# Patient Record
Sex: Female | Born: 1960 | Race: White | Hispanic: No | Marital: Married | Smoking: Never smoker
Health system: Southern US, Community
[De-identification: ages and names within clinical notes are randomized; demographics above are authoritative.]

## PROBLEM LIST (undated history)

## (undated) DIAGNOSIS — R519 Headache, unspecified: Secondary | ICD-10-CM

## (undated) DIAGNOSIS — D62 Acute posthemorrhagic anemia: Secondary | ICD-10-CM

## (undated) DIAGNOSIS — K219 Gastro-esophageal reflux disease without esophagitis: Secondary | ICD-10-CM

## (undated) DIAGNOSIS — I219 Acute myocardial infarction, unspecified: Secondary | ICD-10-CM

## (undated) DIAGNOSIS — M7580 Other shoulder lesions, unspecified shoulder: Secondary | ICD-10-CM

## (undated) DIAGNOSIS — R9431 Abnormal electrocardiogram [ECG] [EKG]: Secondary | ICD-10-CM

## (undated) DIAGNOSIS — M1990 Unspecified osteoarthritis, unspecified site: Secondary | ICD-10-CM

## (undated) DIAGNOSIS — Z9889 Other specified postprocedural states: Secondary | ICD-10-CM

## (undated) DIAGNOSIS — R51 Headache: Secondary | ICD-10-CM

## (undated) DIAGNOSIS — F449 Dissociative and conversion disorder, unspecified: Secondary | ICD-10-CM

## (undated) DIAGNOSIS — G709 Myoneural disorder, unspecified: Secondary | ICD-10-CM

## (undated) DIAGNOSIS — H269 Unspecified cataract: Secondary | ICD-10-CM

## (undated) DIAGNOSIS — I10 Essential (primary) hypertension: Secondary | ICD-10-CM

## (undated) DIAGNOSIS — F1190 Opioid use, unspecified, uncomplicated: Secondary | ICD-10-CM

## (undated) DIAGNOSIS — F329 Major depressive disorder, single episode, unspecified: Secondary | ICD-10-CM

## (undated) DIAGNOSIS — K5903 Drug induced constipation: Secondary | ICD-10-CM

## (undated) DIAGNOSIS — I951 Orthostatic hypotension: Secondary | ICD-10-CM

## (undated) DIAGNOSIS — F32A Depression, unspecified: Secondary | ICD-10-CM

## (undated) DIAGNOSIS — T402X5A Adverse effect of other opioids, initial encounter: Secondary | ICD-10-CM

## (undated) DIAGNOSIS — I340 Nonrheumatic mitral (valve) insufficiency: Secondary | ICD-10-CM

## (undated) DIAGNOSIS — Z9289 Personal history of other medical treatment: Secondary | ICD-10-CM

## (undated) DIAGNOSIS — T7840XA Allergy, unspecified, initial encounter: Secondary | ICD-10-CM

## (undated) DIAGNOSIS — R52 Pain, unspecified: Secondary | ICD-10-CM

## (undated) DIAGNOSIS — M21371 Foot drop, right foot: Secondary | ICD-10-CM

## (undated) DIAGNOSIS — M9979 Connective tissue and disc stenosis of intervertebral foramina of abdomen and other regions: Secondary | ICD-10-CM

## (undated) DIAGNOSIS — I483 Typical atrial flutter: Secondary | ICD-10-CM

## (undated) DIAGNOSIS — R112 Nausea with vomiting, unspecified: Secondary | ICD-10-CM

## (undated) DIAGNOSIS — R42 Dizziness and giddiness: Secondary | ICD-10-CM

## (undated) DIAGNOSIS — D510 Vitamin B12 deficiency anemia due to intrinsic factor deficiency: Secondary | ICD-10-CM

## (undated) DIAGNOSIS — Z981 Arthrodesis status: Secondary | ICD-10-CM

## (undated) DIAGNOSIS — K266 Chronic or unspecified duodenal ulcer with both hemorrhage and perforation: Secondary | ICD-10-CM

## (undated) DIAGNOSIS — F4310 Post-traumatic stress disorder, unspecified: Secondary | ICD-10-CM

## (undated) DIAGNOSIS — D649 Anemia, unspecified: Secondary | ICD-10-CM

## (undated) DIAGNOSIS — F419 Anxiety disorder, unspecified: Secondary | ICD-10-CM

## (undated) DIAGNOSIS — G8918 Other acute postprocedural pain: Secondary | ICD-10-CM

## (undated) DIAGNOSIS — IMO0002 Reserved for concepts with insufficient information to code with codable children: Secondary | ICD-10-CM

## (undated) DIAGNOSIS — E871 Hypo-osmolality and hyponatremia: Secondary | ICD-10-CM

## (undated) DIAGNOSIS — J189 Pneumonia, unspecified organism: Secondary | ICD-10-CM

## (undated) DIAGNOSIS — J45909 Unspecified asthma, uncomplicated: Secondary | ICD-10-CM

## (undated) DIAGNOSIS — Z87442 Personal history of urinary calculi: Secondary | ICD-10-CM

## (undated) DIAGNOSIS — G8929 Other chronic pain: Secondary | ICD-10-CM

## (undated) DIAGNOSIS — M25559 Pain in unspecified hip: Secondary | ICD-10-CM

## (undated) DIAGNOSIS — M5090 Cervical disc disorder, unspecified, unspecified cervical region: Secondary | ICD-10-CM

## (undated) HISTORY — PX: TONSILLECTOMY: SUR1361

## (undated) HISTORY — DX: Arthrodesis status: Z98.1

## (undated) HISTORY — DX: Chronic or unspecified duodenal ulcer with both hemorrhage and perforation: K26.6

## (undated) HISTORY — DX: Myoneural disorder, unspecified: G70.9

## (undated) HISTORY — DX: Unspecified osteoarthritis, unspecified site: M19.90

## (undated) HISTORY — PX: JOINT REPLACEMENT: SHX530

## (undated) HISTORY — DX: Post-traumatic stress disorder, unspecified: F43.10

## (undated) HISTORY — DX: Nonrheumatic mitral (valve) insufficiency: I34.0

## (undated) HISTORY — DX: Headache, unspecified: R51.9

## (undated) HISTORY — DX: Acute posthemorrhagic anemia: D62

## (undated) HISTORY — DX: Pain in unspecified hip: M25.559

## (undated) HISTORY — DX: Unspecified cataract: H26.9

## (undated) HISTORY — DX: Dissociative and conversion disorder, unspecified: F44.9

## (undated) HISTORY — PX: ABDOMINAL HYSTERECTOMY: SHX81

## (undated) HISTORY — PX: EYE SURGERY: SHX253

## (undated) HISTORY — PX: CHOLECYSTECTOMY: SHX55

## (undated) HISTORY — PX: RADIOFREQUENCY ABLATION NERVES: SUR1070

## (undated) HISTORY — DX: Acute myocardial infarction, unspecified: I21.9

## (undated) HISTORY — DX: Other shoulder lesions, unspecified shoulder: M75.80

## (undated) HISTORY — DX: Dizziness and giddiness: R42

## (undated) HISTORY — DX: Headache: R51

## (undated) HISTORY — DX: Other acute postprocedural pain: G89.18

## (undated) HISTORY — DX: Other specified postprocedural states: Z98.890

## (undated) HISTORY — DX: Allergy, unspecified, initial encounter: T78.40XA

## (undated) HISTORY — DX: Typical atrial flutter: I48.3

## (undated) HISTORY — DX: Orthostatic hypotension: I95.1

## (undated) HISTORY — DX: Hypo-osmolality and hyponatremia: E87.1

## (undated) HISTORY — PX: CARDIAC ELECTROPHYSIOLOGY MAPPING AND ABLATION: SHX1292

## (undated) HISTORY — DX: Cervical disc disorder, unspecified, unspecified cervical region: M50.90

## (undated) HISTORY — PX: SPINE SURGERY: SHX786

## (undated) HISTORY — PX: OTHER SURGICAL HISTORY: SHX169

## (undated) HISTORY — DX: Personal history of other medical treatment: Z92.89

## (undated) HISTORY — DX: Other chronic pain: G89.29

## (undated) HISTORY — PX: HERNIA REPAIR: SHX51

## (undated) HISTORY — DX: Vitamin B12 deficiency anemia due to intrinsic factor deficiency: D51.0

## (undated) HISTORY — DX: Connective tissue and disc stenosis of intervertebral foramina of abdomen and other regions: M99.79

---

## 1996-03-13 HISTORY — PX: BREAST EXCISIONAL BIOPSY: SUR124

## 1998-11-18 ENCOUNTER — Ambulatory Visit (HOSPITAL_COMMUNITY): Admission: RE | Admit: 1998-11-18 | Discharge: 1998-11-18 | Payer: Self-pay | Admitting: Internal Medicine

## 1998-11-18 ENCOUNTER — Encounter: Payer: Self-pay | Admitting: Internal Medicine

## 1998-12-02 ENCOUNTER — Ambulatory Visit (HOSPITAL_COMMUNITY): Admission: RE | Admit: 1998-12-02 | Discharge: 1998-12-02 | Payer: Self-pay | Admitting: Internal Medicine

## 1999-11-17 ENCOUNTER — Other Ambulatory Visit: Admission: RE | Admit: 1999-11-17 | Discharge: 1999-11-17 | Payer: Self-pay | Admitting: Family Medicine

## 2003-04-27 DIAGNOSIS — K266 Chronic or unspecified duodenal ulcer with both hemorrhage and perforation: Secondary | ICD-10-CM | POA: Insufficient documentation

## 2003-04-27 DIAGNOSIS — K589 Irritable bowel syndrome without diarrhea: Secondary | ICD-10-CM | POA: Insufficient documentation

## 2003-04-27 HISTORY — DX: Chronic or unspecified duodenal ulcer with both hemorrhage and perforation: K26.6

## 2004-01-13 ENCOUNTER — Ambulatory Visit: Payer: Self-pay | Admitting: General Surgery

## 2004-03-10 ENCOUNTER — Ambulatory Visit: Payer: Self-pay | Admitting: Gastroenterology

## 2004-03-22 ENCOUNTER — Ambulatory Visit: Payer: Self-pay | Admitting: Gastroenterology

## 2004-03-24 ENCOUNTER — Ambulatory Visit: Payer: Self-pay | Admitting: Gastroenterology

## 2004-04-19 ENCOUNTER — Ambulatory Visit: Payer: Self-pay | Admitting: Gastroenterology

## 2004-05-09 ENCOUNTER — Emergency Department: Payer: Self-pay | Admitting: Emergency Medicine

## 2004-05-09 ENCOUNTER — Other Ambulatory Visit: Payer: Self-pay

## 2004-06-20 ENCOUNTER — Ambulatory Visit: Payer: Self-pay | Admitting: Family Medicine

## 2004-06-21 ENCOUNTER — Ambulatory Visit: Payer: Self-pay | Admitting: Internal Medicine

## 2004-07-11 ENCOUNTER — Ambulatory Visit: Payer: Self-pay | Admitting: Internal Medicine

## 2004-08-11 ENCOUNTER — Ambulatory Visit: Payer: Self-pay | Admitting: Internal Medicine

## 2004-08-15 DIAGNOSIS — D510 Vitamin B12 deficiency anemia due to intrinsic factor deficiency: Secondary | ICD-10-CM

## 2004-08-15 HISTORY — DX: Vitamin B12 deficiency anemia due to intrinsic factor deficiency: D51.0

## 2004-09-10 ENCOUNTER — Ambulatory Visit: Payer: Self-pay | Admitting: Internal Medicine

## 2006-03-13 HISTORY — PX: BREAST BIOPSY: SHX20

## 2006-03-28 ENCOUNTER — Ambulatory Visit: Payer: Self-pay | Admitting: Family Medicine

## 2006-04-04 ENCOUNTER — Ambulatory Visit: Payer: Self-pay | Admitting: Family Medicine

## 2006-04-11 ENCOUNTER — Ambulatory Visit: Payer: Self-pay | Admitting: Family Medicine

## 2006-08-14 ENCOUNTER — Inpatient Hospital Stay: Payer: Self-pay | Admitting: Internal Medicine

## 2006-08-14 ENCOUNTER — Other Ambulatory Visit: Payer: Self-pay

## 2006-09-12 ENCOUNTER — Ambulatory Visit: Payer: Self-pay | Admitting: General Surgery

## 2007-02-22 ENCOUNTER — Ambulatory Visit: Payer: Self-pay | Admitting: Urology

## 2007-03-04 ENCOUNTER — Ambulatory Visit: Payer: Self-pay | Admitting: Urology

## 2007-03-14 HISTORY — PX: BACK SURGERY: SHX140

## 2007-03-18 ENCOUNTER — Ambulatory Visit: Payer: Self-pay | Admitting: Family Medicine

## 2007-05-03 ENCOUNTER — Ambulatory Visit: Payer: Self-pay | Admitting: Family Medicine

## 2007-05-07 ENCOUNTER — Ambulatory Visit: Payer: Self-pay | Admitting: General Surgery

## 2007-05-15 ENCOUNTER — Ambulatory Visit: Payer: Self-pay | Admitting: Family Medicine

## 2007-11-04 ENCOUNTER — Encounter: Admission: RE | Admit: 2007-11-04 | Discharge: 2007-11-04 | Payer: Self-pay | Admitting: Orthopedic Surgery

## 2007-11-28 ENCOUNTER — Ambulatory Visit: Payer: Self-pay | Admitting: *Deleted

## 2008-01-08 ENCOUNTER — Ambulatory Visit: Payer: Self-pay | Admitting: *Deleted

## 2008-01-08 ENCOUNTER — Inpatient Hospital Stay (HOSPITAL_COMMUNITY): Admission: RE | Admit: 2008-01-08 | Discharge: 2008-01-22 | Payer: Self-pay | Admitting: Orthopedic Surgery

## 2008-01-18 ENCOUNTER — Ambulatory Visit: Payer: Self-pay | Admitting: Infectious Disease

## 2008-02-17 ENCOUNTER — Encounter: Admission: RE | Admit: 2008-02-17 | Discharge: 2008-02-17 | Payer: Self-pay | Admitting: Orthopedic Surgery

## 2008-02-19 ENCOUNTER — Encounter: Admission: RE | Admit: 2008-02-19 | Discharge: 2008-02-19 | Payer: Self-pay | Admitting: Orthopedic Surgery

## 2008-03-09 ENCOUNTER — Encounter: Admission: RE | Admit: 2008-03-09 | Discharge: 2008-03-09 | Payer: Self-pay | Admitting: Orthopedic Surgery

## 2008-05-08 ENCOUNTER — Ambulatory Visit (HOSPITAL_COMMUNITY): Admission: RE | Admit: 2008-05-08 | Discharge: 2008-05-08 | Payer: Self-pay | Admitting: Orthopedic Surgery

## 2008-10-20 ENCOUNTER — Ambulatory Visit: Payer: Self-pay | Admitting: Family Medicine

## 2009-06-08 ENCOUNTER — Encounter: Admission: RE | Admit: 2009-06-08 | Discharge: 2009-06-08 | Payer: Self-pay | Admitting: Occupational Medicine

## 2010-02-23 ENCOUNTER — Emergency Department: Payer: Self-pay | Admitting: Emergency Medicine

## 2010-04-14 ENCOUNTER — Other Ambulatory Visit: Payer: Self-pay | Admitting: Orthopedic Surgery

## 2010-04-14 DIAGNOSIS — M542 Cervicalgia: Secondary | ICD-10-CM

## 2010-04-22 ENCOUNTER — Ambulatory Visit
Admission: RE | Admit: 2010-04-22 | Discharge: 2010-04-22 | Disposition: A | Discharge status: Home / Self Care | Payer: No Typology Code available for payment source | Source: Ambulatory Visit | Attending: Orthopedic Surgery | Admitting: Orthopedic Surgery

## 2010-04-22 DIAGNOSIS — M542 Cervicalgia: Secondary | ICD-10-CM

## 2010-05-13 ENCOUNTER — Ambulatory Visit: Payer: Self-pay | Admitting: Family Medicine

## 2010-06-28 LAB — COMPREHENSIVE METABOLIC PANEL
ALT: 15 U/L (ref 0–35)
AST: 21 U/L (ref 0–37)
CO2: 23 mEq/L (ref 19–32)
Calcium: 9.6 mg/dL (ref 8.4–10.5)
Chloride: 108 mEq/L (ref 96–112)
GFR calc Af Amer: >60 mL/min (ref >60)
GFR calc non Af Amer: >60 mL/min (ref >60)
Glucose, Bld: 105 mg/dL — ABNORMAL HIGH (ref 70–99)
Sodium: 138 mEq/L (ref 135–145)
Total Bilirubin: 0.6 mg/dL (ref 0.3–1.2)

## 2010-06-28 LAB — CBC
Hemoglobin: 12.8 g/dL (ref 12.0–15.0)
MCHC: 33.6 g/dL (ref 30.0–36.0)
MCV: 88.3 fL (ref 78.0–100.0)
RBC: 4.31 MIL/uL (ref 3.87–5.11)
WBC: 4.7 10*3/uL (ref 4.0–10.5)

## 2010-06-28 LAB — URINALYSIS, ROUTINE W REFLEX MICROSCOPIC
Nitrite: NEGATIVE
Protein, ur: NEGATIVE mg/dL
Specific Gravity, Urine: 1.026 (ref 1.005–1.030)
Urobilinogen, UA: 0.2 mg/dL (ref 0.0–1.0)

## 2010-06-28 LAB — URINE MICROSCOPIC-ADD ON

## 2010-06-28 LAB — PROTIME-INR: INR: 1 (ref 0.00–1.49)

## 2010-07-06 ENCOUNTER — Other Ambulatory Visit: Payer: Self-pay | Admitting: Orthopedic Surgery

## 2010-07-06 DIAGNOSIS — M25551 Pain in right hip: Secondary | ICD-10-CM

## 2010-07-12 HISTORY — PX: ANTERIOR FUSION CERVICAL SPINE: SUR626

## 2010-07-18 ENCOUNTER — Other Ambulatory Visit (HOSPITAL_COMMUNITY): Payer: Self-pay | Admitting: Orthopedic Surgery

## 2010-07-18 ENCOUNTER — Ambulatory Visit
Admission: RE | Admit: 2010-07-18 | Discharge: 2010-07-18 | Disposition: A | Discharge status: Home / Self Care | Payer: No Typology Code available for payment source | Source: Ambulatory Visit | Attending: Orthopedic Surgery | Admitting: Orthopedic Surgery

## 2010-07-18 ENCOUNTER — Encounter (HOSPITAL_COMMUNITY)
Admission: RE | Admit: 2010-07-18 | Discharge: 2010-07-18 | Disposition: A | Discharge status: Home / Self Care | Payer: No Typology Code available for payment source | Source: Ambulatory Visit | Attending: Orthopedic Surgery | Admitting: Orthopedic Surgery

## 2010-07-18 ENCOUNTER — Ambulatory Visit (HOSPITAL_COMMUNITY)
Admission: RE | Admit: 2010-07-18 | Discharge: 2010-07-18 | Disposition: A | Discharge status: Home / Self Care | Payer: No Typology Code available for payment source | Source: Ambulatory Visit | Attending: Orthopedic Surgery | Admitting: Orthopedic Surgery

## 2010-07-18 DIAGNOSIS — M542 Cervicalgia: Secondary | ICD-10-CM | POA: Insufficient documentation

## 2010-07-18 DIAGNOSIS — Z01818 Encounter for other preprocedural examination: Secondary | ICD-10-CM | POA: Insufficient documentation

## 2010-07-18 DIAGNOSIS — M25551 Pain in right hip: Secondary | ICD-10-CM

## 2010-07-18 DIAGNOSIS — Z01812 Encounter for preprocedural laboratory examination: Secondary | ICD-10-CM | POA: Insufficient documentation

## 2010-07-18 DIAGNOSIS — Z01810 Encounter for preprocedural cardiovascular examination: Secondary | ICD-10-CM | POA: Insufficient documentation

## 2010-07-18 LAB — BASIC METABOLIC PANEL
Calcium: 9.9 mg/dL (ref 8.4–10.5)
GFR calc Af Amer: >60 mL/min (ref >60)
GFR calc non Af Amer: >60 mL/min (ref >60)
Glucose, Bld: 106 mg/dL — ABNORMAL HIGH (ref 70–99)
Potassium: 4 mEq/L (ref 3.5–5.1)
Sodium: 140 mEq/L (ref 135–145)

## 2010-07-18 LAB — CBC
HCT: 37.8 % (ref 36.0–46.0)
MCHC: 33.9 g/dL (ref 30.0–36.0)
Platelets: 322 10*3/uL (ref 150–400)
RDW: 12.9 % (ref 11.5–15.5)
WBC: 6.2 10*3/uL (ref 4.0–10.5)

## 2010-07-18 LAB — SURGICAL PCR SCREEN
MRSA, PCR: NEGATIVE
Staphylococcus aureus: NEGATIVE

## 2010-07-20 ENCOUNTER — Ambulatory Visit (HOSPITAL_COMMUNITY)
Admission: RE | Admit: 2010-07-20 | Payer: No Typology Code available for payment source | Source: Ambulatory Visit | Admitting: Orthopedic Surgery

## 2010-07-26 NOTE — Op Note (Signed)
[name], [name]             ACCOUNT NO.:  [id number]   MEDICAL RECORD NO.:  [id number]          PATIENT TYPE:  AMB   LOCATION:  DAY                          FACILITY:  [facility]   PHYSICIAN:  Ollen Gross, M.D.    DATE OF BIRTH:  1960-04-15   DATE OF PROCEDURE:  05/08/2008  DATE OF DISCHARGE:                               OPERATIVE REPORT   PREOPERATIVE DIAGNOSIS:  Right hip labral tear,   POSTOPERATIVE DIAGNOSES:  Right hip labral tear plus chondral defect,  acetabulum.   PROCEDURE:  Right hip arthroscopy with labral debridement and  chondroplasty.   SURGEON:  Ollen Gross, M.D.   ASSISTANT:  Alexzandrew L. Perkins, P.A.C.   ANESTHESIA:  General.   ESTIMATED BLOOD LOSS:  Minimal.   DRAINS:  None.   COMPLICATIONS:  None.   CONDITION:  Stable to recovery.   CLINICAL NOTE:  [name] is a 50 year old female who has severe right hip  pain and mechanical type symptoms.  She had previous back surgery  recently by Dr. Shon Baton and had developed severe hip pain after that.  Workup included an MRI showing a labral tear.  She had intra-articular  injection which did help the pain temporarily.  She presents now for hip  arthroscopy with possible debridement.   PROCEDURE IN DETAIL:  After successful administration of general  anesthetic, the patient was placed in left lateral decubitus position  with the right side up.  The right leg was draped over the well-padded  perineal post and right foot put into a well-padded leg holder.  The  bottom leg was also well padded on the bed.  Under fluoroscopic guidance  traction was applied across the right lower extremity to adequately  distract the hip.  Once in a distracted position, traction was locked in  that position.  The thigh was then prepped and draped in the usual  sterile fashion.  The spinal needles were used to create the anterior  and posterior peritrochanteric portals.  They were passed into the  joint.  The posterior  portal was created first.  An incision made and  via the cannulated system the camera and cannula were passed into the  joint.  Once confirmed to be intraarticular the inflow was initiated.  The femoral head looks normal throughout its extent.  The posterior and  superior acetabulum looked fine.  Around the 1 o'clock or 2 o'clock  position there was no distinct labral tear, but appears to be some  instability of the acetabular cartilage.  I created the anterior portal  and placed a probe.  Indeed, there was some blistering of the acetabular  cartilage where it was separating from the underlying bone.  The labrum  showed no evidence of detachment and there was just some fraying at the  free edge, but no full tear.  I debrided the cartilage defect back to a  stable bony base with stable cartilaginous edges.  I probed all around  it and everything else was stable.  The whole area of this was only  about 1 x 1 cm.  The frayed area of the labrum  was debrided by the  shaver and then the ArthroCare device to stabilize it.  I then again  inspected the rest of the joint.  I again probed that area anteriorly  and there is no other unstable cartilage noted.  The rest of the joint  looked fine.  I then removed the arthroscopic equipment from the  anterior portal and then injected 20 mL of 0.25% Marcaine with epi  through the inflow cannula and then removed that cannula.  Traction was  taken off the joint and then the incision was closed with interrupted 4-  0 nylon.  The hip was placed through range of motion to confirm that it  was reduced.  A sterile bulky sterile dressing was then applied and she  was awakened and transported to recovery in stable condition.      Ollen Gross, M.D.  Electronically Signed     FA/MEDQ  D:  05/08/2008  T:  05/08/2008  Job:  [id number]

## 2010-07-26 NOTE — Op Note (Signed)
NAMETERASA, ORSINI             ACCOUNT NO.:  [id number]   MEDICAL RECORD NO.:  [id number]          PATIENT TYPE:  INP   LOCATION:  2550                         FACILITY:  [facility]   PHYSICIAN:  Alvy Beal, MD    DATE OF BIRTH:  03/09/1961   DATE OF PROCEDURE:  DATE OF DISCHARGE:                               OPERATIVE REPORT   PREOPERATIVE DIAGNOSIS:  Degenerative lumbar disk disease with  degenerative scoliosis.   POSTOPERATIVE DIAGNOSIS:  Degenerative lumbar disk disease with  degenerative scoliosis.   PROCEDURE:  Stage I lumbar spinal fusion, anterior lumbar interbody  fusion L2-3, L3-4.   COMPLICATIONS:  None.   APPROACH SURGEON:  Dr. Madilyn Fireman.  Please refer to his notes for specifics  on the approach.   It should be noted that I did assisted him for the anterior approach.   HISTORY:  Tearah is a very pleasant 50 year old woman who has had  longstanding progressive low back pain.  Despite appropriate  conservative management consisting of physical therapy, injections,  medications, she progressed with her pain.  Diskogram and x-rays  confirmed that L2-3, L3-4 disk levels were most symptomatic.  Because of  the underlying deformity, the decision was made not only to treat the  diskogenic pain but also to address the deformity.  This will require  staged anterior interbody fusion for improved deformity correction with  the supplemental posterior fixation.  Today was stage I of that which  was an anterior lumbar interbody fusion L2-3, L3-4 with the planned  posterior T10 to L4 instrumented fusion Monday of next week.   OPERATIVE NOTE:  The patient was brought to the operating room and  placed supine on the operating table.  After successful induction of  general anesthesia and endotracheal intubation, TEDs, SCDs, and Foley  were applied.  The anterior abdomen was then prepped and draped in a  standard anterior.  Dr. Madilyn Fireman then performed a standard anterior  approach to  a transverse incision to expose the L2-3, L3-4 disk space.  Once we had approached, we placed the retractors and we took an x-ray  confirming the appropriate level.  At this point in time, I then went to  the left hand side in order to perform the diskectomy.   Once the annulotomy was performed with the #10 blade scalpel, then I  began resecting the disk material with the combination of pituitary  rongeurs, curettes, and Kerrison rongeurs.  Once I had the bulk of the  disk removed, I then identified the posterior aspect of the disk and  annulus.  I had resected all the way down to the posterior annulus.  I  then used the posterior longitudinal elevator to restrict the posterior  annulus off the posterior aspect of the L2 and L3 vertebral bodies.   At this point in time with the diskectomy complete, I then used  sequential rasp to prepare the endplates.  Once the subchondral bone was  removed, I then measured the interbody space.  For the first space I  used a Stryker PEEK interbody cage packed with Actifuse.  This is 22 mm  x 28 mm with 12 mm high 8 degree lordotic __________.  I had excellent  fixation.   Once I completed this diskectomy, I confirmed satisfactory position, AP  and lateral planes.  Once confirmed, I then proceeded to the L3-4 level.  Using the same technique I changed the retractor blade's placement and  ensured there was no adverse bleeding.  I then used the same technique  to resect the entire L3-4 disk material.  Once I had the complete  diskectomy done in a similar fashion at L3-4, I rasped sequentially from  10 to 12 and used the same size interbody device to fuse the L3-4 level.   At this point, I irrigated the wound copiously with normal saline and  checked to see if there was any hematoma or hemorrhage.  Once I had  evacuated and maintained hemostasis, I then removed all the retractors.  Final intraoperative AP and lateral was taken to confirm that was no   retained surgical instrumentation or sponges.  I also noted on that we  had good correction of the curvature.  Dr. Madilyn Fireman then came back to the  room to check the wound and felt as though that it was satisfactory to  close just the anterior rectus sheath.  I then closed the anterior  rectus sheath with interrupted #1 Vicryl sutures in layered closure with  running 0 and interrupted 2-0 Vicryl sutures for the subcutaneous fat.  I then reapproximated the skin edges with 3-0 Monocryl.  Steri-Strips  and dry dressing were applied.  The patient was extubated and  transferred to the PACU without incident.  At the end of the case, all  needle and sponge counts were correct.      Alvy Beal, MD  Electronically Signed     DDB/MEDQ  D:  01/08/2008  T:  01/09/2008  Job:  [id number]

## 2010-07-26 NOTE — Consult Note (Signed)
[name], [name]             ACCOUNT NO.:  [id number]   MEDICAL RECORD NO.:  [id number]          PATIENT TYPE:  INP   LOCATION:  5034                         FACILITY:  [facility]   PHYSICIAN:  Renee Ramus, MD       DATE OF BIRTH:  Jan 03, 1961   DATE OF CONSULTATION:  DATE OF DISCHARGE:                                 CONSULTATION   HISTORY OF PRESENT ILLNESS:  The patient is a 50 year old female who is  admitted to the hospital for instrumented fusion of T4 to L10.  I called  to see the patient after discovery of pyelonephritis which was resistant  to multiple antibiotics.   PAST MEDICAL HISTORY:  1. Hypertension.  2. Degenerative disk disease.  3. Neuropathy.  4. Anxiety.  5. 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   SOCIAL HISTORY:  The patient is married, with 2 children.  She works as  a Engineer, civil (consulting).  She has no alcohol or tobacco history.   FAMILY HISTORY:  Not available.   REVIEW OF SYSTEMS:  All other comprehensive review of systems are  negative.   CURRENT MEDICATIONS:  1. Cefazolin.  2. Ciprofloxacin.  3. Lopressor.   ALLERGIES:  CODEINE and SHELLFISH and an intolerance to TMP SULFA.   PHYSICAL EXAMINATION:  GENERAL:  This is a well-developed, well-  nourished white female currently in no apparent distress.  VITAL SIGNS:  Pulse 71, temperature 98, respiratory rate 18, blood  pressure 105/63.  HEENT:  No jugular venous distention or lymphadenopathy.  Oropharynx is  clear.  Mucous membranes are pink and moist.  TMs are clear bilaterally.  Pupils are equal and reactive to light and accommodation.  Extraocular  muscles are intact.  CARDIOVASCULAR:  Regular, rate, and rhythm without murmur, rubs, or  gallops.  PULMONARY:  Lungs are clear to auscultation bilaterally.  ABDOMEN:  Soft, nontender, and nondistended without hepatosplenomegaly.  Bowel sounds are present.  She  has no rebound or guarding.  EXTREMITIES:  She has no clubbing, cyanosis, or edema.  She has good  peripheral pulses in dorsalis pedis and radial arteries.  She is able to  move all extremities.  NEUROLOGIC:  Cranial nerves II through XII are grossly intact.  She has  no focal neurological deficits.   LABORATORY DATA:  White count 6.9, H&H 11.7 and 33, MCV 89, platelets  348.  Sodium 136, potassium 3.7, chloride 102, bicarb 26, BUN 8,  creatinine 0.7, glucose 108.  UA currently shows trace blood, trace  leukocyte, few yeast and bacteria.  Previous UA on January 11, 2008,  showed higher degree of leukocytes and bacteria.  Culture on January 11, 2008, showed E. coli which was resistant to all antibiotics except for  Macrobid and imipenem.  Culture for UA done on January 14, 2008, shows  E. coli with sensitivities currently pending.   ASSESSMENT AND PLAN:  1. Status post spinal fusion, treatment for surgery.  2. Hypertension, currently well controlled.  3. Urinary tract infection.  The  patient should be sensitive to      Macrobid according to sensitivities, however, given her history of      atrophic kidney and the fact that she has recently had major      surgery and the fact that she has been on Macrobid in the past as      prophylaxis, we will treat her initially with imipenem x6 doses and      transition to Macrobid x7 days.  Followup UA in the a.m. to see if      the patient is responding.   DISPOSITION:  We will continue to follow.  Consult note was obtained by  reviewing past medical history confirming with the attending and  reviewing the current medical history.   TIME SPENT:  One  hour.      Renee Ramus, MD  Electronically Signed     JF/MEDQ  D:  01/15/2008  T:  01/16/2008  Job:  [id number]

## 2010-07-26 NOTE — Op Note (Signed)
[name], [name]             ACCOUNT NO.:  [id number]   MEDICAL RECORD NO.:  [id number]          PATIENT TYPE:  INP   LOCATION:  0454                         FACILITY:  [facility]   PHYSICIAN:  Balinda Quails, M.D.    DATE OF BIRTH:  [birth year]   DATE OF PROCEDURE:  01/09/2008  DATE OF DISCHARGE:                               OPERATIVE REPORT   SURGEON:  Balinda Quails, MD   CO-SURGEONDebria Garret D. Shon Baton, MD   ANESTHETIC:  General endotracheal.   PREOPERATIVE DIAGNOSIS:  L2-3 degenerative disk disease.   POSTOPERATIVE DIAGNOSIS:  L2-3 degenerative disk disease.   PROCEDURE:  L2-3 anterior lumbar interbody fusion (ALIF).   CLINICAL NOTE:  [name] was a 50 year old female with history of  chronic back pain.  She was seen in consultation preoperatively in the  office and felt to be an adequate candidate for an L2-3 anterior lumbar  interbody fusion.  Details of the operative procedure were reviewed with  the patient, including potential complications.  Complications include,  but are not limited to ureter injury, DVT, pulmonary embolus, bleeding,  transfusion, limb ischemia, deep infection, hernia, or other major  complication.   OPERATIVE PROCEDURE:  The patient was brought to the operating room in a  stable condition.  Placed in a supine position.  General endotracheal  anesthesia induced.  Central venous catheter, Foley catheter, and  arterial line in place.  In the supine position, general endotracheal  anesthesia induced.  The abdomen prepped and draped in a sterile  fashion.   Transverse skin incision then made at the left upper quadrant at  projection of L2-3.  Subcutaneous tissue divided.  The left anterior  rectus sheath cleared.  The left anterior rectus sheath incised  transversely from midline to lateral margin of rectus muscle.  Rectus  muscle mobilized medially and the posterior rectus sheath incised.  Peritoneum pushed off of the posterior rectus sheath.   Peritoneum was  very thin anteriorly and was entered anteriorly.  This was very thin and  filmy and therefore was not repaired.  This was retracted medially.  The  retroperitoneal space then created by pushing the peritoneal medial from  the lateral approach.  The left psoas muscle and genitofemoral nerve  identified, the nerve preserved on the muscle.  The left ureter  identified, retracted medially with the peritoneal contents.  The L2-3  bodies were identified.  Segmental arteries and veins were ligated with  clips and divided.  The iliolumbar vein was ligated with 2-0 silk and  divided.  This allowed complete mobilization medially of the origin of  the left common iliac vein.  Left common iliac vein rotated medially and  the L2-3 disk was identified with fluoroscopy.  The L2-3 disk cleared  from left-to-right with blunt dissection.   [name] retractors were then assembled.  The reverse lips were  used on the lateral margins of L2-3 bilaterally.  Malleable retractors  placed inferiorly and superiorly.  This allowed full exposure of the L2-  3 disk.  Dr. Shon Baton then completed L2-3 ALIF.  This was dictated under  separate heading.   There were no apparent complications during the exposure procedure.   Dr. Shon Baton then completed closure.  Sponge and instrument counts  correct.  No apparent complications.   In the PACU, the patient noted to have 2+ dorsalis pedis pulses  bilaterally.      Balinda Quails, M.D.  Electronically Signed     PGH/MEDQ  D:  01/08/2008  T:  01/09/2008  Job:  [id number]   cc:   Alvy Beal, MD

## 2010-07-26 NOTE — Op Note (Signed)
[name], [name]             ACCOUNT NO.:  [id number]   MEDICAL RECORD NO.:  [id number]          PATIENT TYPE:  INP   LOCATION:  5034                         FACILITY:  [facility]   PHYSICIAN:  Alvy Beal, MD    DATE OF BIRTH:  10/05/1960   DATE OF PROCEDURE:  DATE OF DISCHARGE:                               OPERATIVE REPORT   PREOPERATIVE DIAGNOSIS:  Degenerative disk disease, L2-3 and L3-4 with  scoliosis.   POSTOPERATIVE DIAGNOSIS:  Degenerative disk disease, L2-3 and L3-4 with  scoliosis.   OPERATIVE PROCEDURE:  Supplemental posterior instrumented fusion, T10-  L4.   HISTORY:  [name] is a very pleasant 50 year old woman who last week,  Wednesday, underwent a stage I treatment for her degenerative scoliosis  with degenerative disk disease.  She had an L2-3 and L3-4 anterior  lumbar interbody fusion without significant complication.  She recovered  without significant problems and she is brought back to the OR today for  planned posterior supplemental instrumented fusion and curve correction  from T10-L4.   All appropriate risks, benefits, and alternatives were discussed with  the patient.  Consent was obtained.   OPERATIVE NOTE:  The patient was brought to the operating room and  placed supine on the operating table.  After successful induction of  general anesthesia and endotracheal intubation, TED, SCD, and a Foley  were applied.  The half [place] frame was secured over the patient, and  she was turned into the prone position.   All bony prominences were well padded.  The back was then prepped and  draped in a standard fashion.  Fluoroscopy was then used to mark the L4  and T10 spinous process and appropriate midline posterior incision was  made.  Sharp dissection was carried out down to the deep fascia and the  deep fascia was incised.   Using a Cobb elevator and Bovie, I stripped the paraspinal muscles to  expose the T9 to the L4 spinous processes bilaterally.   With the  paraspinal muscles stripped, I then carried out my dissection over the  facet complexes to expose the L1-2, L2-3, and L3-4 facet complexes.  The  facet capsules were removed to facilitate fusion.  I then dissected out  laterally to expose the L1, L2, L3 and L4 transverse processes.  Once I  had the lumbar spine exposed bilaterally, I then extended my incision up  to the thoracic region.  I exposed the thoracic T9 through T12 spinous  processes.  I then expose the lamina and the costovertebral  articulation.  At this point, I had proper posterior access to the  entire thoracolumbar spine.   I began with placing my lumbar pedicle screws.   Under fluoroscopic guidance, I identified the appropriate position of  the pedicle and appropriate trajectory.  I identified the midportion of  the transverse process and traced it to the area junction of the lateral  aspect of the facet complex in transverse process.  I used a awl to  breakthrough the cortex and a pedicle probe was stent through the  pedicle and into the L4 vertebral body.  I confirmed trajectory in  position at both the AP and lateral planes.  I then removed the probe,  put a ball-tip feeler down to ensure there had a solid bony canal.  I  then tapped and then reprobed the hole.  Once I had a solid bony  articulation, I placed the appropriate-sized pedicle screw.   I then repeated this procedure on the contralateral side and again at  L3, L2, and an L1.  Once I had all the lumbar pedicle screws properly  positioned and confirmed down the AP and lateral planes, I then turned  my attention to the thoracic.  At T10 using the AP plane, I identified  the midline and identified the medial and lateral and superior and  inferior borders of the pedicle.  I then advanced the pedicle probe in  an extrapedicular fashion towards the medial wall of the pedicle.  Once  I was just near the medial wall of the pedicle, I put the  fluoroscopy  into the lateral to confirm that I was beyond the posterior vertebral  body.  Once I confirmed this, I then advanced it into the vertebral  body.  This is standard extrapedicular approach to the thoracic pedicle  screws.  I then removed the probe, used my ball-tip feeler to ensure  there had a solid bony canal and then I had not violated the canal.  I  then tapped, rechecked, and then placed the appropriate-sized pedicle  screw.  I repeated this on the contralateral side and then again at T11  and T12.  While all the pedicle screws were properly positioned, I then  took final AP and lateral fluoroscopy views.  For instrumentation, I  used the Stryker Radius pedicle screw system.  At L4 in the lumbar  region, I used 6.25 diameter 40 and 35-mm screws except for the left L1,  which was a 5.75 diameter and a thoracic spine, 35 mm length, 5.75 mm  diameter screws.   I then obtained two 220-mm rods, contoured them appropriately and  secured them.  I confirmed satisfactory position in the AP and lateral  planes.  I then identified the curve pattern, which showed the concavity  on the right and convexity on the left.  I then began distracting in the  concavity and compressing on the convexity.  Once I was able to compress  and distract appropriately, I locked all the screws in place.  The  deformity was adequately reduced.  I then took an intraoperative digital  plane view anterior of the thoracolumbar spine, confirmed I had improved  the overall alignment and that the hardware was in good position.  At  this point with the deformity just corrected, I rechecked the lateral  fluoroscopy to ensure that all of the pedicle screws were properly  positioned.  I then make sure all of the bolts were properly locked to  the final floor.  I then placed two cross links.  I identified the  transverse processes decorticated them and then took the spinous  processes at T10 through L4 for the  local bone graft.  I placed them in  the posterior lateral gutter supplemented with cortical cancellous chips  and 40 mL of Actifuse synthetic bone graft extender.  Two cross links  were placed; one near the thoracolumbar junction and other one at the L2-  3 level.  I then irrigated the wound copiously with normal saline,  obtained hemostasis using bipolar electrocautery and then closed the  deep fascia with interrupted #1 Vicryl sutures.  I then used 2-0 Vicryl  sutures on the superficial and 3-0 Monocryl for the skin.  Steri-Strips  and dry dressing were applied.  She was extubated and transferred to the  PACU without incident.  At the end of the case, all needle and sponge  counts were correct.      Alvy Beal, MD  Electronically Signed     DDB/MEDQ  D:  01/13/2008  T:  01/14/2008  Job:  [id number]

## 2010-07-26 NOTE — Consult Note (Signed)
VASCULAR SURGERY CONSULTATION   [name], [name]  DOB:  September 30, 1960                                       11/28/2007  UEAVW#:[id number]   REFERRING PHYSICIAN:  Alvy Beal, MD   REASON FOR REFERRAL:  Degenerative disk disease L2-L3.   HISTORY:  The patient is seen in preoperative consultation today prior  to L2-3 anterior lumbar interbody fusion.  This is scheduled to be done  in association with Dr. Shon Baton.   No history of DVT or pulmonary embolus.  Denies a history of asthma or  COPD.  She is a nonsmoker.   PAST MEDICAL HISTORY:  Hypertension.   PAST SURGICAL HISTORY:  1. Hysterectomy.  2. Laparoscopic cholecystectomy.  3. Tonsillectomy.   FAMILY HISTORY:  Noncontributory.   SOCIAL HISTORY:  The patient is married with two children.  She works as  a Engineer, civil (consulting).  Does not use tobacco or alcohol products.   REVIEW OF SYSTEMS:  This is unremarkable.  The patient denies any weight  change or anorexia.  No chest pain or shortness of breath.  No cough or  sputum production.  No change in bowel habits.  No dysuria or frequency.   MEDICATIONS:  1. Metoprolol 25 mg p.o. daily.  2. Percocet 10/325 p.r.n.   ALLERGIES:  Codeine.   PHYSICAL EXAM:  General:  A well-appearing 50 year old female.  No acute  distress.  Alert and oriented.  Vital signs:  BP is 152/95, pulse 73 per  minute.  HEENT:  Mouth and throat are clear.  Normocephalic.  Neck:  Supple.  No thyromegaly or adenopathy.  Cardiovascular:  Normal heart  sounds without murmurs.  No carotid bruits.  Regular rate and rhythm.  Chest:  Equal air entry bilaterally without rales or rhonchi.  Abdomen:  Soft, nontender.  No masses or organomegaly.  Pfannenstiel incision.  Small laparoscopy scar is noted.  Relatively wide costal margin.  Extremities:  2+ femoral, popliteal, posterior tibial and dorsalis pedis  pulses bilaterally.  No ankle edema.   IMPRESSION:  A 50 year old female scheduled to undergo L2-3  anterior  lumbar interbody fusion.  This does appear to be approachable anteriorly  due to relatively wide costal margin.  Will discuss further plans with  Dr. Shon Baton and schedule as needed.   Balinda Quails, M.D.  Electronically Signed  PGH/MEDQ  D:  11/28/2007  T:  11/29/2007  Job:  [id number]

## 2010-07-26 NOTE — Discharge Summary (Signed)
[name], [name]             ACCOUNT NO.:  [id number]   MEDICAL RECORD NO.:  [id number]          PATIENT TYPE:  INP   LOCATION:  5034                         FACILITY:  [facility]   PHYSICIAN:  Alvy Beal, MD    DATE OF BIRTH:  Jul 05, 1960   DATE OF ADMISSION:  01/08/2008  DATE OF DISCHARGE:  01/22/2008                               DISCHARGE SUMMARY   REASON FOR ADMISSION:  Disk disease with degenerative lumbar scoliosis.   DISCHARGE DIAGNOSES:  1. Postoperative anemia requiring transfusion.  2. Acute pyelonephritis.   CONSULTATIONS:  1. P. Liliane Bade, MD, from Vascular who did standard anterior approach      for the ALIF.  2. Renee Ramus, MD, from Medicine for an inpatient hospital consult.  3. The Infectious Disease, Dr. Acey Lav, MD, for evaluation      of pyelonephritis.   HISTORY:  [name] is a very pleasant 50 year old woman who has been  dealing with chronic lumbar spine pain for a very prolonged period of  time.  Attempts at conservative management had failed to alleviate her  pain, as a result we elected to proceed with surgery.  The planned  procedure was staged anterior posterior spinal fusion to deal both with  the degenerative disk disease as well as the scoliosis.  Stage I was an  anterior lumbar interbody fusion at L2-3 and L3-4.  Stage II was a  posterior T10 through L4 instrumented fusion.  All appropriate risks,  benefits, and alternatives to surgery were discussed with the patient  and her husband and consent was obtained.   The patient's medical history included hypertension, anxiety, and  degenerative disk disease.  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  She is married with 2 children.  She  works as a Engineer, civil (consulting).  She denies alcohol or tobacco history.   REVIEW OF SYSTEMS:  Only complicated as per previously mentioned medical   history.   MEDICATIONS:  1. Cephazolin.  2. Ciprofloxacin.  3. Lopressor.  4. Xanax.  5. Percocet.   ALLERGIES:  She had an allergy to CODEINE and SHELLFISH and SULFA  MEDICATIONS.   INITIAL PHYSICAL EXAMINATION:  GENERAL:  She is alert and oriented x3.  MUSCULOSKELETAL:  She had significant back pain, but she was grossly  neurologically intact in the lower extremity.  Negative Babinski sign  symmetrical, but diminished deep tendon reflexes.  ABDOMEN:  Soft, nontender, no rebound tenderness.  NEUROLOGIC:  Cranial nerves II through XII were tested, they were  intact.   PLAN:  At this point in time, the patient was evaluated preoperatively  by Dr. Madilyn Fireman to assess the feasibility of an anterior approach.  Once  this was done, the patient consented to the afore-mentioned procedure.   HOSPITAL COURSE:  On January 08, 2008, the patient was admitted and  taken to the operating room for the anterior lumbar interbody fusion.  The surgery was essentially uneventful.  There was no significant  adverse intraoperative complications.  There was no significant  blood  loss.  Postoperatively, the patient was taken to the orthopedic floor.  She was noted to have significant pain, which was adequately treated  with her PCA.  Post-ALIF CAT scan demonstrated satisfactory position of  the hardware as well as indirect decompression.  There was some  retroperitoneal fluid collection, which would be expected per the  surgery, and there was no significant adverse CT findings.  The patient  was started on Lovenox for DVT prophylaxis and early ambulation.   She was mobilized appropriately with physical therapy, but she was  complaining of significant left anterior thigh pain.  She was grossly  neurologically intact, but she did have significant left anterior thigh  pain.  After appropriate testing, this was felt to be due to irritation  of the general femoral nerve, which is common complication with an   anterior approach, as I discussed with the patient preoperatively.  Over  the course of the next 3 or 4 days, the anterior thigh weakness  decreased.  On January 13, 2008, the patient was brought back to the  operating room for the staged planned posterior instrumented fusion.  Because the thigh pain was felt to be more of a peripheral nerve  irritation than a true radiculopathy, the decision was made not to do  any formal decompression at the time of the posterior instrumentation.   The posterior procedure went uneventful.  There was no significant  intraoperative adverse events and blood loss was minimal.   On Tuesday the 3rd, the patient was taken to CT scan and she had  satisfactory position of all the hardware and the graft was in good  position.   However, the patient was complaining of significant back pain.  Her PCA  was modified appropriately and the pain seemed to improve.  It should be  noted that she also was diagnosed with UTI.  Because of the increasing  pain as well as a previous history of multiple UTIs and the right kidney  atrophy, I requested a hospitalist consult for further evaluation.  She  was appropriately evaluated by Dr. Janice Norrie on January 15, 2008, and  change from ciprofloxacin to Macrobid.  She had been on Macrobid in the  past.   The patient continued to slowly improve.  On January 16, 2008, her blood  count was decreased.  Her hemoglobin was 7.6 and so she was transfused.  This was anemia due to postoperative anemia and she responded well to  the transfusion.   The repeat UA returned back with E. coli.  The patient continued on  antibiotics.  Because of the previous urinary tract infections and the  sequela, an ID consult was requested.  ID saw the patient and made  appropriate changes and appropriate recommendations.   The patient was improving.  She was doing quite well and on Saturday,  the 6th, the patient was ambulating.  Her UTI seemed to be  improving.  However, Saturday evening, she developed horrific right flank pain.  This prevented the patient's discharge on Sunday.  Because of the severe  increasing pain, a repeat CT scan was done, which did not demonstrate  any significant evidence of fluid collection/abscess or renal  abnormality.  The patient was felt to have pyelonephritis and was  changed to appropriate IV antibiotics.  The patient responded to the IV  antibiotics and ultimately was able to be taken off her PCA and started  back on oral medications.  Her flank pain improved with the  change in  antibiotics.  Yesterday, the patient was on an oral pain regimen  ambulating without great difficulty and feeling much better.  Because of  the need for a 10-day IV antibiotic treatment for her UTI, an  appropriate pick line was placed.  The patient tolerated this well.  At  this point in time, the patient will be discharged with a daily dosing  of antibiotics of __________ 1 g IV daily times 9 days.  She also be  kept on an aspirin 81 mg p.o. daily, Lyrica 75 mg 1 p.o. b.i.d., Robaxin  for muscle spasms 1 t.i.d. p.r.n. 500 mg.  She will have oxycodone 20 mg  extended release for overall pain control 1 twice a day and then for  breakthrough pain 1 Percocet ten 325 q.6.  She will have appropriate  followup with me in 2 weeks for repeat wound evaluation.  It should be  noted that prior to discharge both the anterior abdominal wound and the  posterior spinal wound were doing quite well.  There was no evidence of  any wound infection.  There was no significant drainage.  I will also  make sure that the patient has appropriate followup with respect to her  primary care physician/ID.  All of the patient's questions and concerns  were addressed.      Alvy Beal, MD  Electronically Signed     DDB/MEDQ  D:  01/22/2008  T:  01/22/2008  Job:  [id number]

## 2010-08-03 ENCOUNTER — Encounter (HOSPITAL_COMMUNITY)
Admission: RE | Admit: 2010-08-03 | Discharge: 2010-08-03 | Disposition: A | Discharge status: Home / Self Care | Payer: No Typology Code available for payment source | Source: Ambulatory Visit | Attending: Orthopedic Surgery | Admitting: Orthopedic Surgery

## 2010-08-03 LAB — CBC
Hemoglobin: 13.1 g/dL (ref 12.0–15.0)
MCH: 30.4 pg (ref 26.0–34.0)
Platelets: 336 10*3/uL (ref 150–400)
RBC: 4.31 MIL/uL (ref 3.87–5.11)
WBC: 7 10*3/uL (ref 4.0–10.5)

## 2010-08-03 LAB — PROTIME-INR
INR: 0.89 (ref 0.00–1.49)
Prothrombin Time: 12.2 seconds (ref 11.6–15.2)

## 2010-08-03 LAB — COMPREHENSIVE METABOLIC PANEL
ALT: 38 U/L — ABNORMAL HIGH (ref 0–35)
Albumin: 4 g/dL (ref 3.5–5.2)
BUN: 8 mg/dL (ref 6–23)
Calcium: 9.9 mg/dL (ref 8.4–10.5)
Glucose, Bld: 103 mg/dL — ABNORMAL HIGH (ref 70–99)
Potassium: 4 mEq/L (ref 3.5–5.1)
Sodium: 138 mEq/L (ref 135–145)
Total Protein: 7.3 g/dL (ref 6.0–8.3)

## 2010-08-03 LAB — DIFFERENTIAL
Basophils Absolute: 0.1 10*3/uL (ref 0.0–0.1)
Basophils Relative: 1 % (ref 0–1)
Eosinophils Absolute: 0.1 10*3/uL (ref 0.0–0.7)
Monocytes Relative: 6 % (ref 3–12)
Neutro Abs: 4.4 10*3/uL (ref 1.7–7.7)
Neutrophils Relative %: 63 % (ref 43–77)

## 2010-08-04 ENCOUNTER — Ambulatory Visit (HOSPITAL_COMMUNITY): Payer: No Typology Code available for payment source

## 2010-08-04 ENCOUNTER — Inpatient Hospital Stay (HOSPITAL_COMMUNITY): Payer: No Typology Code available for payment source

## 2010-08-04 ENCOUNTER — Inpatient Hospital Stay (HOSPITAL_COMMUNITY)
Admission: RE | Admit: 2010-08-04 | Discharge: 2010-08-05 | DRG: 473 | Disposition: A | Discharge status: Home / Self Care | Payer: No Typology Code available for payment source | Source: Ambulatory Visit | Attending: Orthopedic Surgery | Admitting: Orthopedic Surgery

## 2010-08-04 DIAGNOSIS — M47812 Spondylosis without myelopathy or radiculopathy, cervical region: Principal | ICD-10-CM | POA: Diagnosis present

## 2010-08-22 NOTE — Op Note (Signed)
[name], [name]             ACCOUNT NO.:  [id number]  MEDICAL RECORD NO.:  [id number]           PATIENT TYPE:  I  LOCATION:  5005                         FACILITY:  [facility]  PHYSICIAN:  Alvy Beal, MD    DATE OF BIRTH:  08-23-1960  DATE OF PROCEDURE:  08/04/2010 DATE OF DISCHARGE:                              OPERATIVE REPORT   PREOPERATIVE DIAGNOSIS:  Cervical spondylotic radiculopathy, C6-7.  POSTOPERATIVE DIAGNOSIS:  Cervical spondylotic radiculopathy, C6-7.  OPERATIVE PROCEDURE:  Anterior cervical diskectomy, C6-7, with arthrodesis through a separate incision, structural iliac crest autograft and bone graft harvest, and preparation for intervertebral fusion.  COMPLICATIONS:  None.  CONDITION:  Stable.  The anterior cervical plating system was a Synthes Vectra plate.  HISTORY:  [name] is a very pleasant 50 year old woman who has been having severe progressive neck and radicular left arm pain.  Attempts at conservative management had failed to alleviate her symptoms.  As a result, she elected to proceed with surgery.  All appropriate risks, benefits, and alternatives to surgery were discussed and consent was obtained.  OPERATIVE NOTE:  The patient was brought to the operating room, placed supine on the operating table.  After successful induction of general anesthesia and endotracheal intubation, roller towels were placed between the shoulder blades.  Shoulders themselves were taped down in the right iliac crest and anterior cervical spine were prepped and draped in a standard fashion.  Appropriate time-out was done to confirm patient and procedure and all other pertinent important data.  Once this was done, we then proceeded with the surgery.  A transverse incision was made, centered over the C6-7 disk space.  Sharp dissection was carried out down to and through the platysma.  I then bluntly dissected, mobilizing omohyoid, trachea, and esophagus to the right  and protecting with an appendiceal retractor and then identified the carotid sheath and protected it with a finger.  I then mobilized the deep cervical and prevertebral fascia to expose the anterior longitudinal ligament in its entirety.  At this point, I could see the C6-7 disk space and I placed a needle into the disk space, took an x-ray and confirmed that I was at the appropriate level.  Once this was done, I used a bipolar electrocautery to mobilize the longus coli muscles out laterally to the level of the uncovertebral joint.  Once this was done, Caspar retracting blades were placed underneath the longus coli muscle.  The endotracheal cuff was deflated.  The Caspar retractor was expanded and the endotracheal cuff was reinflated.  Distraction pins were placed into bodies of C6-C7 and I distracted the disk space.  An annulotomy was performed with a #15-blade scalpel.  Then using a combination of pituitary rongeurs, curettes, and Kerrison rongeurs, I removed all of the disk material.  I then used a fine nerve hook to develop a plane underneath the posterior longitudinal ligament, then used a 1-mm Kerrison to resect the posterior longitudinal ligament.  At this point, I could clearly pass my nerve hook underneath the uncovertebral joint and I knew I had the adequate decompression diskectomy.  I rasped the endplates in order  to have bleeding bone.  At this point, I went down to the right iliac crest, palpated, and made a small incision.  I dissected down to the superior aspect of the iliac crest.  I then mobilized the muscles on either side to expose the inner and outer table.  I then used a high drill to drill an 8-mm bone, tricortical wedge of the iliac crest.  Once I had removed this, I irrigated that wound copiously with normal saline.  Bone wax to bleeding edges of the iliac crest and closed the incision with an interrupted #1 Vicryl suture, 2-0 Vicryl suture, and 3-0 Monocryl.   At this point, I contoured the graft to about a 7.5 mm high somewhat lordotic graft.  I then went back to the cervical incision and gently tapped the graft into place.  The graft fit in good form to the endplates very nicely.  At this point with the graft properly placed, I then took a 14-mm anterior cervical plate, contoured it, and secured it with self-drilling screws into the bodies of C6-C7 with 14-mm screws.  I had excellent fixation. All screws got excellent purchase and we locked down appropriately.  I removed all the remaining retractors and then checked to ensure the esophagus was not entrapped beneath the plate.  I then obtained hemostasis using bipolar electrocautery, returned the trachea and esophagus to midline, closed platysma with interrupted 2-0 Vicryl sutures, 3-0 Monocryl for the skin.  Steri-Strips, dry dressing were placed on both wound as was an Biochemist, clinical.  She was extubated and transferred to PACU without incident.  At the end of the case, all needle and sponge counts were correct.     Alvy Beal, MD     DDB/MEDQ  D:  08/04/2010  T:  08/05/2010  Job:  [id number]  Electronically Signed by Venita Lick MD on 08/22/2010 12:01:17 AM

## 2010-12-13 LAB — URINALYSIS, ROUTINE W REFLEX MICROSCOPIC
Bilirubin Urine: NEGATIVE
Glucose, UA: NEGATIVE
Glucose, UA: NEGATIVE
Ketones, ur: NEGATIVE
Ketones, ur: NEGATIVE
Nitrite: NEGATIVE
Protein, ur: NEGATIVE
pH: 7.5

## 2010-12-13 LAB — CBC
HCT: 39.7
HCT: 21.3 — ABNORMAL LOW
HCT: 30 — ABNORMAL LOW
HCT: 33.2 — ABNORMAL LOW
Hemoglobin: 13.4
Hemoglobin: 10.3 — ABNORMAL LOW
Hemoglobin: 11.7 — ABNORMAL LOW
Hemoglobin: 7.6 — CL
MCHC: 33.7
MCHC: 34.5
MCHC: 35.3
MCHC: 35.7
MCV: 88.7
MCV: 88.8
MCV: 89.6
Platelets: 469 — ABNORMAL HIGH
RBC: 4.42
RBC: 2.38 — ABNORMAL LOW
RBC: 3.38 — ABNORMAL LOW
RBC: 3.83 — ABNORMAL LOW
RDW: 12.6
RDW: 12.9
RDW: 13.5
WBC: 7.9
WBC: 7.8

## 2010-12-13 LAB — DIFFERENTIAL
Basophils Absolute: 0
Basophils Relative: 0
Eosinophils Absolute: 0.2
Monocytes Relative: 1 — ABNORMAL LOW
Neutrophils Relative %: 80 — ABNORMAL HIGH

## 2010-12-13 LAB — BASIC METABOLIC PANEL
BUN: 8
CO2: 26
CO2: 26
Calcium: 8.6
Chloride: 102
Chloride: 102
Creatinine, Ser: 0.67
GFR calc non Af Amer: >60
Glucose, Bld: 108 — ABNORMAL HIGH
Glucose, Bld: 156 — ABNORMAL HIGH
Potassium: 4.1
Potassium: 3.9
Sodium: 136
Sodium: 135

## 2010-12-13 LAB — URINE MICROSCOPIC-ADD ON

## 2010-12-13 LAB — URINALYSIS, MICROSCOPIC ONLY
Bilirubin Urine: NEGATIVE
Glucose, UA: NEGATIVE
Specific Gravity, Urine: 1.008
pH: 6.5

## 2010-12-13 LAB — CROSSMATCH: ABO/RH(D): AB POS

## 2010-12-13 LAB — GLUCOSE, CAPILLARY: Glucose-Capillary: 129 — ABNORMAL HIGH

## 2010-12-13 LAB — COMPREHENSIVE METABOLIC PANEL
ALT: 20
Alkaline Phosphatase: 69
BUN: 8
CO2: 28
Calcium: 7.8 — ABNORMAL LOW
GFR calc non Af Amer: >60
Glucose, Bld: 93
Total Protein: 4.4 — ABNORMAL LOW

## 2010-12-13 LAB — PROTIME-INR: INR: 0.9

## 2010-12-13 LAB — CULTURE, BLOOD (ROUTINE X 2)
Culture: NO GROWTH
Culture: NO GROWTH

## 2010-12-13 LAB — URINE CULTURE: Special Requests: POSITIVE

## 2010-12-13 LAB — TYPE AND SCREEN: ABO/RH(D): AB POS

## 2010-12-13 LAB — ABO/RH: ABO/RH(D): AB POS

## 2010-12-13 LAB — OCCULT BLOOD X 1 CARD TO LAB, STOOL: Fecal Occult Bld: NEGATIVE

## 2010-12-13 LAB — HIGH SENSITIVITY CRP: CRP, High Sensitivity: 63.6 — ABNORMAL HIGH

## 2010-12-23 ENCOUNTER — Ambulatory Visit (HOSPITAL_COMMUNITY)
Admission: RE | Admit: 2010-12-23 | Payer: No Typology Code available for payment source | Source: Ambulatory Visit | Admitting: Orthopedic Surgery

## 2011-03-23 ENCOUNTER — Ambulatory Visit: Payer: Self-pay | Admitting: Family Medicine

## 2011-05-12 HISTORY — PX: NASAL SEPTUM SURGERY: SHX37

## 2011-05-16 ENCOUNTER — Ambulatory Visit: Payer: Self-pay | Admitting: Otolaryngology

## 2011-07-06 ENCOUNTER — Ambulatory Visit: Payer: Self-pay | Admitting: Family Medicine

## 2011-07-17 ENCOUNTER — Other Ambulatory Visit: Payer: Self-pay | Admitting: Orthopedic Surgery

## 2011-07-17 DIAGNOSIS — M25551 Pain in right hip: Secondary | ICD-10-CM

## 2011-07-24 ENCOUNTER — Ambulatory Visit: Payer: Self-pay | Admitting: Pain Medicine

## 2011-07-26 ENCOUNTER — Ambulatory Visit
Admission: RE | Admit: 2011-07-26 | Discharge: 2011-07-26 | Disposition: A | Discharge status: Home / Self Care | Payer: Medicaid Other | Source: Ambulatory Visit | Attending: Orthopedic Surgery | Admitting: Orthopedic Surgery

## 2011-07-26 DIAGNOSIS — M25551 Pain in right hip: Secondary | ICD-10-CM

## 2011-07-26 MED ORDER — IOHEXOL 180 MG/ML  SOLN
15.0000 mL | Freq: Once | INTRAMUSCULAR | Status: AC | PRN
  Administered 2011-07-26: 15 mL via INTRA_ARTICULAR

## 2011-08-14 ENCOUNTER — Ambulatory Visit
Admission: RE | Admit: 2011-08-14 | Discharge: 2011-08-14 | Disposition: A | Discharge status: Home / Self Care | Payer: Medicaid Other | Source: Ambulatory Visit | Attending: Orthopedic Surgery | Admitting: Orthopedic Surgery

## 2011-08-14 ENCOUNTER — Other Ambulatory Visit: Payer: Self-pay | Admitting: Orthopedic Surgery

## 2011-08-14 DIAGNOSIS — R52 Pain, unspecified: Secondary | ICD-10-CM

## 2011-08-17 ENCOUNTER — Other Ambulatory Visit: Payer: Self-pay | Admitting: Orthopedic Surgery

## 2011-08-17 DIAGNOSIS — M542 Cervicalgia: Secondary | ICD-10-CM

## 2011-08-21 ENCOUNTER — Ambulatory Visit
Admission: RE | Admit: 2011-08-21 | Discharge: 2011-08-21 | Disposition: A | Discharge status: Home / Self Care | Payer: Medicaid Other | Source: Ambulatory Visit | Attending: Orthopedic Surgery | Admitting: Orthopedic Surgery

## 2011-08-21 DIAGNOSIS — M542 Cervicalgia: Secondary | ICD-10-CM

## 2011-08-21 MED ORDER — GADOBENATE DIMEGLUMINE 529 MG/ML IV SOLN
12.0000 mL | Freq: Once | INTRAVENOUS | Status: AC | PRN
  Administered 2011-08-21: 12 mL via INTRAVENOUS

## 2011-08-28 ENCOUNTER — Other Ambulatory Visit: Payer: Medicaid Other

## 2011-09-05 ENCOUNTER — Other Ambulatory Visit: Payer: Self-pay | Admitting: Orthopedic Surgery

## 2011-09-05 NOTE — Progress Notes (Signed)
Preoperative surgical orders have been place into the [facility] hospital system for [name] on 09/05/2011, 11:01 PM  by Patrica Duel for surgery on 09/20/2011.  Preop Hip orders including Experel Injecion, IV Tylenol, and IV Decadron as long as there are no contraindications to the above medications.

## 2011-09-06 ENCOUNTER — Ambulatory Visit (HOSPITAL_COMMUNITY)
Admission: RE | Admit: 2011-09-06 | Discharge: 2011-09-06 | Disposition: A | Discharge status: Home / Self Care | Payer: Medicaid Other | Source: Ambulatory Visit | Attending: Orthopedic Surgery | Admitting: Orthopedic Surgery

## 2011-09-06 ENCOUNTER — Encounter (HOSPITAL_COMMUNITY): Payer: Self-pay

## 2011-09-06 ENCOUNTER — Encounter (HOSPITAL_COMMUNITY): Payer: Self-pay | Admitting: Pharmacy Technician

## 2011-09-06 ENCOUNTER — Encounter (HOSPITAL_COMMUNITY)
Admission: RE | Admit: 2011-09-06 | Discharge: 2011-09-06 | Disposition: A | Discharge status: Home / Self Care | Payer: Medicaid Other | Source: Ambulatory Visit | Attending: Orthopedic Surgery | Admitting: Orthopedic Surgery

## 2011-09-06 DIAGNOSIS — R9431 Abnormal electrocardiogram [ECG] [EKG]: Secondary | ICD-10-CM | POA: Insufficient documentation

## 2011-09-06 DIAGNOSIS — Z01818 Encounter for other preprocedural examination: Secondary | ICD-10-CM | POA: Insufficient documentation

## 2011-09-06 DIAGNOSIS — Z01810 Encounter for preprocedural cardiovascular examination: Secondary | ICD-10-CM | POA: Insufficient documentation

## 2011-09-06 DIAGNOSIS — Z01812 Encounter for preprocedural laboratory examination: Secondary | ICD-10-CM | POA: Insufficient documentation

## 2011-09-06 DIAGNOSIS — I498 Other specified cardiac arrhythmias: Secondary | ICD-10-CM | POA: Insufficient documentation

## 2011-09-06 HISTORY — DX: Abnormal electrocardiogram (ECG) (EKG): R94.31

## 2011-09-06 HISTORY — DX: Depression, unspecified: F32.A

## 2011-09-06 HISTORY — DX: Unspecified asthma, uncomplicated: J45.909

## 2011-09-06 HISTORY — DX: Anxiety disorder, unspecified: F41.9

## 2011-09-06 HISTORY — DX: Reserved for concepts with insufficient information to code with codable children: IMO0002

## 2011-09-06 HISTORY — DX: Major depressive disorder, single episode, unspecified: F32.9

## 2011-09-06 HISTORY — DX: Gastro-esophageal reflux disease without esophagitis: K21.9

## 2011-09-06 HISTORY — DX: Nausea with vomiting, unspecified: R11.2

## 2011-09-06 HISTORY — DX: Other specified postprocedural states: Z98.890

## 2011-09-06 HISTORY — DX: Pain, unspecified: R52

## 2011-09-06 HISTORY — DX: Headache: R51

## 2011-09-06 HISTORY — DX: Essential (primary) hypertension: I10

## 2011-09-06 LAB — CBC
HCT: 38.6 % (ref 36.0–46.0)
Hemoglobin: 13.2 g/dL (ref 12.0–15.0)
MCH: 30.1 pg (ref 26.0–34.0)
MCV: 87.9 fL (ref 78.0–100.0)
RBC: 4.39 MIL/uL (ref 3.87–5.11)

## 2011-09-06 LAB — BASIC METABOLIC PANEL
CO2: 24 mEq/L (ref 19–32)
Calcium: 9.6 mg/dL (ref 8.4–10.5)
Creatinine, Ser: 0.68 mg/dL (ref 0.50–1.10)
Glucose, Bld: 97 mg/dL (ref 70–99)

## 2011-09-06 LAB — SURGICAL PCR SCREEN: Staphylococcus aureus: NEGATIVE

## 2011-09-06 NOTE — Patient Instructions (Addendum)
YOUR SURGERY IS SCHEDULED ON:  WED  7/10  AT 12:45 PM  REPORT TO [facility] AT:  10:15 AM      PHONE # FOR SHORT STAY IS [phone]  DO NOT EAT OR DRINK ANYTHING AFTER MIDNIGHT THE NIGHT BEFORE YOUR SURGERY.  YOU MAY BRUSH YOUR TEETH, RINSE OUT YOUR MOUTH--BUT NO WATER, NO FOOD, NO CHEWING GUM, NO MINTS, NO CANDIES, NO CHEWING TOBACCO.  PLEASE TAKE THE FOLLOWING MEDICATIONS THE AM OF YOUR SURGERY WITH A FEW SIPS OF WATER:  ALPRAZOLAM, BUPROPION, METOPROLOL, OXYCODONE/ACETAMINOPHEN.  USE ADVAIR AND ALBUTEROL INHALERS AND BRING THE ALBUTEROL TO THE HOSPITAL TO TAKE TO SURGERY.    IF YOU USE INHALERS--USE YOUR INHALERS THE AM OF YOUR SURGERY AND BRING INHALERS TO THE HOSPITAL -TAKE TO SURGERY.    IF YOU ARE DIABETIC:  DO NOT TAKE ANY DIABETIC MEDICATIONS THE AM OF YOUR SURGERY.  IF YOU TAKE INSULIN IN THE EVENINGS--PLEASE ONLY TAKE 1/2 NORMAL EVENING DOSE THE NIGHT BEFORE YOUR SURGERY.  NO INSULIN THE AM OF YOUR SURGERY.  IF YOU HAVE SLEEP APNEA AND USE CPAP OR BIPAP--PLEASE BRING THE MASK --NOT THE MACHINE-NOT THE TUBING   -JUST THE MASK. DO NOT BRING VALUABLES, MONEY, CREDIT CARDS.  CONTACT LENS, DENTURES / PARTIALS, GLASSES SHOULD NOT BE WORN TO SURGERY AND IN MOST CASES-HEARING AIDS WILL NEED TO BE REMOVED.  BRING YOUR GLASSES CASE, ANY EQUIPMENT NEEDED FOR YOUR CONTACT LENS. FOR PATIENTS ADMITTED TO THE HOSPITAL--CHECK OUT TIME THE DAY OF DISCHARGE IS 11:00 AM.  ALL INPATIENT ROOMS ARE PRIVATE - WITH BATHROOM, TELEPHONE, TELEVISION AND WIFI INTERNET. IF YOU ARE BEING DISCHARGED THE SAME DAY OF YOUR SURGERY--YOU CAN NOT DRIVE YOURSELF HOME--AND SHOULD NOT GO HOME ALONE BY TAXI OR BUS.  NO DRIVING OR OPERATING MACHINERY FOR 24 HOURS FOLLOWING ANESTHESIA / PAIN MEDICATIONS.                            SPECIAL INSTRUCTIONS:  SOAP SHOWER NIGHT BEFORE SURGERY AND AM OF SURGERY - WITH ANTIBACTERIAL SOAP.  PLEASE READ OVER ANY  FACT SHEETS THAT YOU WERE GIVEN: MRSA  INFORMATION,INCENTIVE SPIROMETER INFORMATION.

## 2011-09-17 ENCOUNTER — Other Ambulatory Visit: Payer: Self-pay | Admitting: Orthopedic Surgery

## 2011-09-17 MED ORDER — DEXAMETHASONE SODIUM PHOSPHATE 10 MG/ML IJ SOLN: 10.0000 mg | Freq: Once | INTRAMUSCULAR | Status: DC

## 2011-09-17 MED ORDER — BUPIVACAINE LIPOSOME 1.3 % IJ SUSP: 20.0000 mL | Freq: Once | INTRAMUSCULAR | Status: DC

## 2011-09-17 NOTE — Progress Notes (Signed)
Preoperative surgical orders have been place into the [facility] hospital system for [name] on 09/17/2011, 10:52 PM  by Patrica Duel for surgery on 09/20/2011.  Preop Hip orders including Experel Injecion, IV Tylenol, and IV Decadron as long as there are no contraindications to the above medications. [name], PA-C

## 2011-09-20 ENCOUNTER — Ambulatory Visit (HOSPITAL_COMMUNITY): Payer: Medicaid Other | Admitting: Anesthesiology

## 2011-09-20 ENCOUNTER — Ambulatory Visit (HOSPITAL_COMMUNITY): Payer: Medicaid Other

## 2011-09-20 ENCOUNTER — Encounter (HOSPITAL_COMMUNITY): Payer: Self-pay | Admitting: Anesthesiology

## 2011-09-20 ENCOUNTER — Encounter (HOSPITAL_COMMUNITY)
Admission: RE | Disposition: A | Discharge status: Home / Self Care | Payer: Self-pay | Source: Ambulatory Visit | Attending: Orthopedic Surgery

## 2011-09-20 ENCOUNTER — Ambulatory Visit (HOSPITAL_COMMUNITY)
Admission: RE | Admit: 2011-09-20 | Discharge: 2011-09-20 | Disposition: A | Discharge status: Home / Self Care | Payer: Medicaid Other | Source: Ambulatory Visit | Attending: Orthopedic Surgery | Admitting: Orthopedic Surgery

## 2011-09-20 ENCOUNTER — Encounter (HOSPITAL_COMMUNITY): Payer: Self-pay | Admitting: *Deleted

## 2011-09-20 DIAGNOSIS — M25551 Pain in right hip: Secondary | ICD-10-CM

## 2011-09-20 DIAGNOSIS — K219 Gastro-esophageal reflux disease without esophagitis: Secondary | ICD-10-CM | POA: Insufficient documentation

## 2011-09-20 DIAGNOSIS — Z79899 Other long term (current) drug therapy: Secondary | ICD-10-CM | POA: Insufficient documentation

## 2011-09-20 DIAGNOSIS — I10 Essential (primary) hypertension: Secondary | ICD-10-CM | POA: Insufficient documentation

## 2011-09-20 DIAGNOSIS — J45909 Unspecified asthma, uncomplicated: Secondary | ICD-10-CM | POA: Insufficient documentation

## 2011-09-20 DIAGNOSIS — M24159 Other articular cartilage disorders, unspecified hip: Secondary | ICD-10-CM | POA: Insufficient documentation

## 2011-09-20 DIAGNOSIS — R011 Cardiac murmur, unspecified: Secondary | ICD-10-CM | POA: Insufficient documentation

## 2011-09-20 HISTORY — PX: HIP ARTHROSCOPY: SHX668

## 2011-09-20 SURGERY — ARTHROSCOPY HIP
Anesthesia: General | Site: Hip | Laterality: Right | Wound class: Clean

## 2011-09-20 MED ORDER — LIDOCAINE HCL (CARDIAC) 20 MG/ML IV SOLN
INTRAVENOUS | Status: DC | PRN
  Administered 2011-09-20: 30 mg via INTRAVENOUS

## 2011-09-20 MED ORDER — DEXTROSE 5 % IV SOLN: 3.0000 g | INTRAVENOUS | Status: DC

## 2011-09-20 MED ORDER — SUCCINYLCHOLINE CHLORIDE 20 MG/ML IJ SOLN
INTRAMUSCULAR | Status: DC | PRN
  Administered 2011-09-20: 100 mg via INTRAVENOUS

## 2011-09-20 MED ORDER — ACETAMINOPHEN 10 MG/ML IV SOLN: 1000.0000 mg | Freq: Once | INTRAVENOUS | Status: DC

## 2011-09-20 MED ORDER — HYDROMORPHONE HCL PF 1 MG/ML IJ SOLN
INTRAMUSCULAR | Status: AC
  Filled 2011-09-20: qty 1

## 2011-09-20 MED ORDER — HYDROMORPHONE HCL PF 1 MG/ML IJ SOLN
0.2500 mg | INTRAMUSCULAR | Status: DC | PRN
  Administered 2011-09-20 (×3): 0.5 mg via INTRAVENOUS

## 2011-09-20 MED ORDER — BUPIVACAINE-EPINEPHRINE PF 0.25-1:200000 % IJ SOLN
INTRAMUSCULAR | Status: AC
  Filled 2011-09-20: qty 30

## 2011-09-20 MED ORDER — MIDAZOLAM HCL 5 MG/5ML IJ SOLN
INTRAMUSCULAR | Status: DC | PRN
  Administered 2011-09-20: 1 mg via INTRAVENOUS

## 2011-09-20 MED ORDER — ONDANSETRON HCL 4 MG/2ML IJ SOLN
INTRAMUSCULAR | Status: DC | PRN
  Administered 2011-09-20 (×2): 2 mg via INTRAVENOUS

## 2011-09-20 MED ORDER — ACETAMINOPHEN 10 MG/ML IV SOLN
1000.0000 mg | Freq: Once | INTRAVENOUS | Status: DC
  Filled 2011-09-20: qty 100

## 2011-09-20 MED ORDER — OXYCODONE-ACETAMINOPHEN 10-325 MG PO TABS: 1.0000 | ORAL_TABLET | ORAL | Status: AC | PRN

## 2011-09-20 MED ORDER — MORPHINE SULFATE 10 MG/ML IJ SOLN: 1.0000 mg | INTRAMUSCULAR | Status: DC | PRN

## 2011-09-20 MED ORDER — HYDROMORPHONE HCL PF 1 MG/ML IJ SOLN
INTRAMUSCULAR | Status: DC | PRN
  Administered 2011-09-20: 0.5 mg via INTRAVENOUS
  Administered 2011-09-20: 1.5 mg via INTRAVENOUS

## 2011-09-20 MED ORDER — PROMETHAZINE HCL 25 MG/ML IJ SOLN: 6.2500 mg | INTRAMUSCULAR | Status: DC | PRN

## 2011-09-20 MED ORDER — BUPIVACAINE LIPOSOME 1.3 % IJ SUSP
20.0000 mL | Freq: Once | INTRAMUSCULAR | Status: DC
  Filled 2011-09-20: qty 20

## 2011-09-20 MED ORDER — LACTATED RINGERS IV SOLN
INTRAVENOUS | Status: DC
  Administered 2011-09-20: 1000 mL via INTRAVENOUS

## 2011-09-20 MED ORDER — BUPIVACAINE-EPINEPHRINE 0.25% -1:200000 IJ SOLN
INTRAMUSCULAR | Status: DC | PRN
  Administered 2011-09-20: 20 mL

## 2011-09-20 MED ORDER — CEFAZOLIN SODIUM-DEXTROSE 2-3 GM-% IV SOLR
INTRAVENOUS | Status: AC
  Filled 2011-09-20: qty 50

## 2011-09-20 MED ORDER — METHOCARBAMOL 500 MG PO TABS: 500.0000 mg | ORAL_TABLET | Freq: Four times a day (QID) | ORAL | Status: AC

## 2011-09-20 MED ORDER — CEFAZOLIN SODIUM-DEXTROSE 2-3 GM-% IV SOLR
2.0000 g | INTRAVENOUS | Status: AC
  Administered 2011-09-20: 2 g via INTRAVENOUS

## 2011-09-20 MED ORDER — LACTATED RINGERS IR SOLN
Status: DC | PRN
  Administered 2011-09-20: 6000 mL

## 2011-09-20 MED ORDER — FENTANYL CITRATE 0.05 MG/ML IJ SOLN
INTRAMUSCULAR | Status: DC | PRN
  Administered 2011-09-20 (×3): 50 ug via INTRAVENOUS
  Administered 2011-09-20: 100 ug via INTRAVENOUS

## 2011-09-20 MED ORDER — PROPOFOL 10 MG/ML IV EMUL
INTRAVENOUS | Status: DC | PRN
  Administered 2011-09-20: 200 mg via INTRAVENOUS

## 2011-09-20 MED ORDER — EPHEDRINE SULFATE 50 MG/ML IJ SOLN
INTRAMUSCULAR | Status: DC | PRN
  Administered 2011-09-20: 5 mg via INTRAVENOUS
  Administered 2011-09-20: .5 mg via INTRAVENOUS

## 2011-09-20 MED ORDER — CISATRACURIUM BESYLATE (PF) 10 MG/5ML IV SOLN
INTRAVENOUS | Status: DC | PRN
  Administered 2011-09-20: 5 mg via INTRAVENOUS

## 2011-09-20 MED ORDER — SODIUM CHLORIDE 0.9 % IV SOLN: INTRAVENOUS | Status: DC

## 2011-09-20 MED ORDER — DEXAMETHASONE SODIUM PHOSPHATE 10 MG/ML IJ SOLN: 10.0000 mg | Freq: Once | INTRAMUSCULAR | Status: DC

## 2011-09-20 MED ORDER — LACTATED RINGERS IV SOLN
INTRAVENOUS | Status: DC | PRN
  Administered 2011-09-20: 13:00:00 via INTRAVENOUS

## 2011-09-20 SURGICAL SUPPLY — 34 items
BAG DECANTER FOR FLEXI CONT (MISCELLANEOUS) ×2 IMPLANT
BLADE DA 4.2 (BLADE) ×2 IMPLANT
CLOTH BEACON ORANGE TIMEOUT ST (SAFETY) ×2 IMPLANT
COVER MAYO STAND STRL (DRAPES) ×2 IMPLANT
DRAPE LG THREE QUARTER DISP (DRAPES) ×4 IMPLANT
DRAPE STERI 35X30 U-POUCH (DRAPES) ×4 IMPLANT
DRAPE STERI IOBAN 125X83 (DRAPES) ×2 IMPLANT
DRSG EMULSION OIL 3X3 NADH (GAUZE/BANDAGES/DRESSINGS) ×2 IMPLANT
DRSG MEPILEX BORDER 4X4 (GAUZE/BANDAGES/DRESSINGS) ×1 IMPLANT
DRSG PAD ABDOMINAL 8X10 ST (GAUZE/BANDAGES/DRESSINGS) ×2 IMPLANT
DURAPREP 26ML APPLICATOR (WOUND CARE) ×2 IMPLANT
GLOVE BIO SURGEON STRL SZ7.5 (GLOVE) ×2 IMPLANT
GLOVE BIO SURGEON STRL SZ8 (GLOVE) ×2 IMPLANT
GLOVE BIOGEL PI IND STRL 8 (GLOVE) ×2 IMPLANT
GLOVE BIOGEL PI INDICATOR 8 (GLOVE) ×2
GOWN STRL NON-REIN LRG LVL3 (GOWN DISPOSABLE) ×2 IMPLANT
GOWN STRL REIN XL XLG (GOWN DISPOSABLE) ×2 IMPLANT
KIT HIP ARTHROSCOPY (SET/KITS/TRAYS/PACK) ×2 IMPLANT
MANIFOLD NEPTUNE II (INSTRUMENTS) ×4 IMPLANT
NEEDLE HYPO 22GX1.5 SAFETY (NEEDLE) ×2 IMPLANT
NS IRRIG 1000ML POUR BTL (IV SOLUTION) ×2 IMPLANT
PACK ARTHROSCOPY WL (CUSTOM PROCEDURE TRAY) ×2 IMPLANT
PAD CAST 4YDX4 CTTN HI CHSV (CAST SUPPLIES) ×1 IMPLANT
PADDING CAST COTTON 4X4 STRL (CAST SUPPLIES) ×2
POSITIONER SURGICAL ARM (MISCELLANEOUS) ×2 IMPLANT
SET ARTHROSCOPY TUBING (MISCELLANEOUS) ×2
SET ARTHROSCOPY TUBING LN (MISCELLANEOUS) ×1 IMPLANT
SPONGE GAUZE 4X4 12PLY (GAUZE/BANDAGES/DRESSINGS) ×2 IMPLANT
SUT ETHILON 4 0 PS 2 18 (SUTURE) ×2 IMPLANT
SYR 20CC LL (SYRINGE) ×2 IMPLANT
TAPE CLOTH 4X10 WHT NS (GAUZE/BANDAGES/DRESSINGS) ×2 IMPLANT
TOWEL OR 17X26 10 PK STRL BLUE (TOWEL DISPOSABLE) ×4 IMPLANT
WAND TURBOVAC 50 DEGREE (SURGICAL WAND) IMPLANT
WATER STERILE IRR 1500ML POUR (IV SOLUTION) ×2 IMPLANT

## 2011-09-20 NOTE — H&P (Signed)
CC- [name] is a 51 y.o. female who presents with right hip pain  Hip Pain: Patient complains of right hip pain. Onset of the symptoms was several months ago. Inciting event: none. Current symptoms include is worse with weight bearing and is aggravated by walking. Associated symptoms: none. Aggravating symptoms: going up and down stairs, pivoting, rising after sitting, squatting and walking. Patient's course of pain: gradually worsening. Patient has had prior hip problems.Evaluation to date: MRI with recurrent anterior labral ear.  Treatment to date: prescription analgesics, which have been somewhat effective.  Past Medical History  Diagnosis Date  . Headache     AND NECK PAIN--STATES RECENT TEST SHOW CERVICAL DEGENERATION  . PONV (postoperative nausea and vomiting)     PT GIVES HX OF N&V AND FEVER WITH SURGERIES YEARS AGO--BUT NO PROBLEMS WITH MORE RECENT SURGERIES--STATES NOT MALIGNANT HYPERTHERMIA  . 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 Hypertension   . Anxiety   . Depression     PT STATES A LOT OF STRESS IN HER LIFE  . Asthma     INHALERS WITH FLARE -UPS  . GERD (gastroesophageal reflux disease)   . DDD (degenerative disc disease)     CERVICAL AND LUMBAR-CHRONIC PAIN, RT HIP LABRAL TEAR  . Pain     CHRONIC NECK AND BACK PAIN - LIMITED ROM NECK - S/P FUSIONS CERVICAL AND LUMBAR    Past Surgical History  Procedure Date  . Right hip arthroscopy for labral tear ABOUT 2010  . Anterior fusion cervical spine MAY 2012    AT [facility]  . Nasal septum surgery MARCH 2013    IN [place]  . Back surgery 2009    LUMBAR FUSION WITH RODS   . Tonsillectomy     AS A CHILD  . Abdominal hysterectomy   . Diagnostic laparoscopies - multiple for endometriosis   . Cholecystectomy     Prior to Admission medications   Medication Sig Start Date End Date Taking? Authorizing Provider  ALBUTEROL IN Inhale  into the lungs. ONLY USES IF ASTHMA FLARE UP    Historical Provider, MD  ALPRAZolam (XANAX) 0.5 MG tablet Take 0.5-1 mg by mouth 3 (three) times daily as needed. For anxiety    Historical Provider, MD  buPROPion (WELLBUTRIN SR) 150 MG 12 hr tablet Take 150 mg by mouth 2 (two) times daily.    Historical Provider, MD  Fluticasone-Salmeterol (ADVAIR DISKUS IN) Inhale into the lungs. ONLY USES IF ASTHMA FLARE UP ADVAIR 250 /50 MCG    Historical Provider, MD  methocarbamol (ROBAXIN) 500 MG tablet Take 500 mg by mouth 3 (three) times daily as needed. For muscle spasms    Historical Provider, MD  metoprolol tartrate (LOPRESSOR) 25 MG tablet Take 25 mg by mouth daily with breakfast.    Historical Provider, MD  oxyCODONE-acetaminophen (PERCOCET) 10-325 MG per tablet Take 1 tablet by mouth every 6 (six) hours as needed. For pain    Historical Provider, MD  Ranitidine HCl (ZANTAC PO) Take by mouth. Over the counter as needed    Historical Provider, MD    Physical Examination: General appearance - alert, well appearing, and in no distress Mental status - alert, oriented to person, place, and time Chest - clear to auscultation, no wheezes, rales or rhonchi, symmetric air entry Heart - normal rate, regular rhythm, normal S1, S2, no murmurs, rubs, clicks or gallops Abdomen -  soft, nontender, nondistended, no masses or organomegaly Neurological - alert, oriented, normal speech, no focal findings or movement disorder noted  A right hip exam was performed. TENDERNESS: moderate ROM: normal STRENGTH: equal bilaterally Pain on rotational motions of right hip  ASSESSMENT: Right hip recurrent labral tear  Plan- Right hip arthroscopy with labral debridement- Plan right hip arthroscopy with labral debridement. Discussed procedure risks and potential comps and she elects to proceed. Goals are decreased pain which should be acheived  [name]. Shelby Wire, MD    09/20/2011, 8:16 AM

## 2011-09-20 NOTE — Anesthesia Postprocedure Evaluation (Signed)
  Anesthesia Post-op Note  Patient: [name]  Procedure(s) Performed: Procedure(s) (LRB): ARTHROSCOPY HIP (Right)  Patient Location: PACU  Anesthesia Type: General  Level of Consciousness: awake and alert   Airway and Oxygen Therapy: Patient Spontanous Breathing  Post-op Pain: mild  Post-op Assessment: Post-op Vital signs reviewed, Patient's Cardiovascular Status Stable, Respiratory Function Stable, Patent Airway and No signs of Nausea or vomiting  Post-op Vital Signs: stable  Complications: No apparent anesthesia complications

## 2011-09-20 NOTE — Addendum Note (Signed)
Addendum  created 09/20/11 1533 by Eilene Ghazi, MD   Modules edited:Orders, PRL Based Order Sets

## 2011-09-20 NOTE — Progress Notes (Signed)
Spoke to dr Lequita Halt  Dc decadron (pt allergic) [facility] has dc'd this.  Also pt is allergic to betadine and chg  Per MD, CLIP  Only.  Do not scrub

## 2011-09-20 NOTE — Op Note (Signed)
PREOPERATIVE DIAGNOSIS:   Right hip labral tear.   POSTOPERATIVE DIAGNOSIS:  1. Right hip labral tear.  2. Chondral defect.   PROCEDURE: Right hip arthroscopy with labral debridement and chondroplasty  SURGEON: Ollen Gross, M.D.   ASSISTANT: Alexzandrew L. Perkins, P.A.C.   ANESTHESIA: General.   ESTIMATED BLOOD LOSS: Minimal.   DRAINS: None.   COMPLICATION: None.   CONDITION: Stable to recovery.   BRIEF CLINICAL INDICATION: [name] is a 51 y.o. female with a long  history of significant pain and dysfunction of her right hip. Exam and  history suggested labral tear, recurrent in nature. She has had  extensive nonoperative management, presents now for arthroscopy and  debridement.   PROCEDURE IN DETAIL: After successful administration of general  anesthetic, the patient was placed in the Left lateral decubitus  position with the Right  side up. Lower leg was well padded. Right  lower extremity was then placed over the well-padded peroneal post laterally and then placed in the well-padded foot holder. Under fluoroscopic  guidance, traction was applied across Right lower extremity until the hip  was adequately distracted. Traction was locked in this position. The  thigh was then prepped and draped in usual sterile fashion. Spinal  needles were passed through the sites marked, anterior and posterior  peritrochanteric portals. A small incision was made around the  posterior needle. Through a cannulated system, the cannula was passed  into the joint. Camera passed into the joint. Once confirmed to be  intra-articular, inflow was initiated.      The femoral head shows a mild chondromalacia, no full-thickness defects. The anterior aspect of the joint from about the 3 o'clock to the 5 o'clock position shows high-  grade chondromalacia along the chondral-labral junction with some  exposed bone. There was a tear in the labrum going from the 3 o'clock  to the 5 o'clock  position. It was not detached. Tear was on the free  edge of the labrum. The anterior port was then accessed and labral  tear debrided back to a stable base with the shaver and the ArthroCare  device. The unstable cartilage on the surface of the acetabulum was  debrided back to a stable bony base with stable cartilaginous edges.  The exposed bone was abraded with the shaver for an abrasion  chondroplasty. The overall size of this was about 1 x 2 cm. We then  inspected rest of the joint. The posterior and superior aspects of the acetabulum appeared normal. No other tears, defects, or loose bodies were noted.       The working portal equipment was removed and then 20 cc of 0.25% Marcaine  with epinephrine injected through the inflow cannula and that was  removed. Traction was removed from the joint and the incision was  closed with interrupted 4-0 nylon. Incisions were cleaned and dried.  Bulky sterile dressing applied. She was then awakened and transported  to recovery in stable condition.   Ollen Gross, M.D.

## 2011-09-20 NOTE — Addendum Note (Signed)
Addendum  created 09/20/11 1600 by Valeda Malm, CRNA   Modules edited:Anesthesia Medication Administration

## 2011-09-20 NOTE — Transfer of Care (Signed)
Immediate Anesthesia Transfer of Care Note  Patient: [name]  Procedure(s) Performed: Procedure(s) (LRB): ARTHROSCOPY HIP (Right)  Patient Location: PACU  Anesthesia Type: General  Level of Consciousness: awake, alert  and oriented  Airway & Oxygen Therapy: Patient Spontanous Breathing and Patient connected to face mask oxygen  Post-op Assessment: Report given to PACU RN and Post -op Vital signs reviewed and stable  Post vital signs: Reviewed and stable  Complications: No apparent anesthesia complications

## 2011-09-20 NOTE — Progress Notes (Signed)
Report received for continued care in the PACU. 

## 2011-09-20 NOTE — Addendum Note (Signed)
Addendum  created 09/20/11 1600 by Allyne S Duval Macleod, CRNA   Modules edited:Anesthesia Medication Administration    

## 2011-09-20 NOTE — Interval H&P Note (Signed)
History and Physical Interval Note:  09/20/2011 12:59 PM  [name]  has presented today for surgery, with the diagnosis of right hip labral tear  The various methods of treatment have been discussed with the patient and family. After consideration of risks, benefits and other options for treatment, the patient has consented to  Procedure(s) (LRB): ARTHROSCOPY HIP (Right) as a surgical intervention .  The patient's history has been reviewed, patient examined, no change in status, stable for surgery.  I have reviewed the patients' chart and labs.  Questions were answered to the patient's satisfaction.     Loanne Drilling

## 2011-09-20 NOTE — Anesthesia Preprocedure Evaluation (Addendum)
Anesthesia Evaluation  Patient identified by MRN, date of birth, ID band Patient awake    Reviewed: Allergy & Precautions, H&P , NPO status , Patient's Chart, lab work & pertinent test results  History of Anesthesia Complications (+) PONV  Airway Mallampati: II TM Distance: <3 FB Neck ROM: Limited    Dental  (+) Caps and Dental Advisory Given   Pulmonary asthma ,  breath sounds clear to auscultation  Pulmonary exam normal       Cardiovascular hypertension, Pt. on medications Rhythm:Regular Rate:Normal + Systolic murmurs    Neuro/Psych negative neurological ROS  negative psych ROS   GI/Hepatic Neg liver ROS,   Endo/Other  negative endocrine ROS  Renal/GU negative Renal ROS  negative genitourinary   Musculoskeletal negative musculoskeletal ROS (+)   Abdominal   Peds negative pediatric ROS (+)  Hematology negative hematology ROS (+)   Anesthesia Other Findings   Reproductive/Obstetrics negative OB ROS                        Anesthesia Physical Anesthesia Plan  ASA: II  Anesthesia Plan: General   Post-op Pain Management:    Induction: Intravenous  Airway Management Planned: Oral ETT  Additional Equipment:   Intra-op Plan:   Post-operative Plan: Extubation in OR  Informed Consent: I have reviewed the patients History and Physical, chart, labs and discussed the procedure including the risks, benefits and alternatives for the proposed anesthesia with the patient or authorized representative who has indicated his/her understanding and acceptance.   Dental advisory given  Plan Discussed with: CRNA  Anesthesia Plan Comments:         Anesthesia Quick Evaluation

## 2011-09-21 ENCOUNTER — Encounter (HOSPITAL_COMMUNITY): Payer: Self-pay | Admitting: Orthopedic Surgery

## 2012-04-15 ENCOUNTER — Other Ambulatory Visit: Payer: Self-pay | Admitting: Orthopedic Surgery

## 2012-04-15 MED ORDER — BUPIVACAINE LIPOSOME 1.3 % IJ SUSP: 20.0000 mL | Freq: Once | INTRAMUSCULAR | Status: DC

## 2012-04-15 NOTE — Progress Notes (Signed)
Preoperative surgical orders have been place into the Epic hospital system for [name] on 04/15/2012, 7:43 AM  by Patrica Duel for surgery on 05/13/2012.  Preop Total Hip - Anterior Approach orders including Experel Injecion, IV Tylenol, and IV Decadron as long as there are no contraindications to the above medications. [name], PA-C

## 2012-04-23 ENCOUNTER — Other Ambulatory Visit: Payer: Self-pay | Admitting: Orthopedic Surgery

## 2012-04-23 NOTE — H&P (Signed)
[name]  DOB: 1960-04-01 Married / Language: English / Race: White Female  Date of Admission:  05/13/2012  Chief Complaint:  Right Hip Pain  History of Present Illness The patient is a 52 year old female who comes in for a preoperative History and Physical. The patient is scheduled for a right total hip arthroplasty (Anterior Approach) to be performed by Dr. Gus Rankin. Aluisio, MD at [facility] on 05/13/2012. The patient is a 52 year old female presenting for a post-operative visit out from right hip arthroscopy. The patient states that she is doing poorly at this time. Describes their pain as severe (Patient states she has pain in her groin that radiates down her entire leg. She said that it is constant. She is concerned that she may have torn something again. They are currently on Percocet & Ibuprofen for their pain. The patient feels that they are progressing poorly at this time. Unfortunately, her hip pain is getting worse. At the time of arthroscopy about eight months ago it was noted that she did have degenerative change especially the anterior aspect of the acetabulum and the femoral head. She says she has pain at all times. It is limiting what she can and cannot do. She takes care of her grandchildren and it is getting increasingly difficult to do so. She is ready to get the right hip fixed at this time. They have been treated conservatively in the past for the above stated problem and despite conservative measures, they continue to have progressive pain and severe functional limitations and dysfunction. They have failed non-operative management including home exercise, medications, and injections. It is felt that they would benefit from undergoing total joint replacement. Risks and benefits of the procedure have been discussed with the patient and they elect to proceed with surgery. There are no active contraindications to surgery such as ongoing infection or  rapidly progressive neurological disease.   Problem List Osteoarthritis, Hip (715.35)  Allergies Codeine/Codeine Derivatives. Hives. Tylenol #3 Shellfish. 10/01/2007 Clindamycin HCl *ANTI-INFECTIVE AGENTS - MISC.*. Hives. Sulfa Drugs. Nausea, Vomiting.   Family History Mother. Deceased. age 34, Brain Aneurysm Maternal Grandmother. Deceased. Heart Anuerysm   Social History No alcohol use Tobacco use. Never smoker. Post-Surgical Plans. Plan is for home. Current occupation. Nurse Current work status. Disabled.   Medication History Xanax ( Oral) Specific dose unknown - Active. Metoprolol Succinate ( Oral) Specific dose unknown - Active. Ibuprofen 200 (200MG  Tablet, Oral) Active. Albuterol Sulfate ( Inhalation) Specific dose unknown - Active. Wellbutrin ( Oral) Specific dose unknown - Active. Advair Diskus ( Inhalation) Specific dose unknown - Active. Methocarbamol (500MG  Tablet, 1 Oral three times daily, as needed, Taken starting 02/13/2011) Active. Percocet (10-325MG  Tablet, 1 Oral two times daily, as needed, Taken 05/11/2011 to 11/09/2011) Inactive.   Pregnancy  Pregnancy Status. NO   Past Surgical History Hysterectomy (not due to cancer) - Partial Tonsillectomy Gallbladder Surgery Back Surgery. T10-L4 fusion Right. hip artho. Right wrist CTR Arthroscopic Shoulder Surgery - Right   Medical History Asthma Bronchitis Anxiety Disorder. Panic Attacks Hypertension Arrhythmia. Inverted T-Wave on EKG Gastric Ulcer. Past History History of Elevated Liver Enzymes   Review of Systems General:Not Present- Chills, Fever, Night Sweats, Fatigue, Weight Gain, Weight Loss and Memory Loss. Skin:Not Present- Hives, Itching, Rash, Eczema and Lesions. HEENT:Not Present- Tinnitus, Headache, Double Vision, Visual Loss, Hearing Loss and Dentures. Respiratory:Not Present- Shortness of breath with exertion, Shortness of breath at rest, Allergies,  Coughing up blood and Chronic Cough. Cardiovascular:Not Present-  Chest Pain, Racing/skipping heartbeats, Difficulty Breathing Lying Down, Murmur, Swelling and Palpitations. Gastrointestinal:Not Present- Bloody Stool, Heartburn, Abdominal Pain, Vomiting, Nausea, Constipation, Diarrhea, Difficulty Swallowing, Jaundice and Loss of appetitie. Female Genitourinary:Not Present- Blood in Urine, Urinary frequency, Weak urinary stream, Discharge, Flank Pain, Incontinence, Painful Urination, Urgency, Urinary Retention and Urinating at Night. Musculoskeletal:Present- Joint Pain. Not Present- Muscle Weakness, Muscle Pain, Joint Swelling, Back Pain, Morning Stiffness and Spasms. Neurological:Not Present- Tremor, Dizziness, Blackout spells, Paralysis, Difficulty with balance and Weakness. Psychiatric:Not Present- Insomnia.   Vitals Weight: 142 lb Height: 64 in Body Surface Area: 1.71 m Body Mass Index: 24.37 kg/m BP: 134/82 (Sitting, Right Arm, Standard)    Physical Exam The physical exam findings are as follows:   General Mental Status - Alert, cooperative and good historian. General Appearance- pleasant. Not in acute distress. Orientation- Oriented X3. Build & Nutrition- Well nourished and Well developed.   Head and Neck Head- normocephalic, atraumatic . Neck Global Assessment- supple. no bruit auscultated on the right and no bruit auscultated on the left.   Eye Pupil- Bilateral- Regular and Round. Motion- Bilateral- EOMI.   Chest and Lung Exam Auscultation: Breath sounds:- clear at anterior chest wall and - clear at posterior chest wall. Adventitious sounds:- No Adventitious sounds.   Cardiovascular Auscultation:Rhythm- Regular rate and rhythm. Heart Sounds- S1 WNL and S2 WNL. Murmurs & Other Heart Sounds:Auscultation of the heart reveals - No Murmurs.   Abdomen Palpation/Percussion:Tenderness- Abdomen is non-tender to palpation.  Rigidity (guarding)- Abdomen is soft. Auscultation:Auscultation of the abdomen reveals - Bowel sounds normal.   Female Genitourinary Not done, not pertinent to present illness  Musculoskeletal On exam, well developed female alert and oriented in no apparent distress. She still walks with a significantly antalgic gait. I can flex her hip to 100. No internal rotation. About 30 external rotation and 30 abduction with discomfort. The left hip has normal range of motion without discomfort. She still has some tenderness on the greater trochanter.  RADIOGRAPHS: AP pelvis and lateral of the hip show she has lost most of her superior lateral joint space on the right hip. There is a substantial difference in the x-rays now compared to x-rays last spring.  Assessment & Plan Osteoarthritis, Hip (715.35) Impression: Right Hip  Note: Plan is for a Right Total Hip Replacement by Dr. Lequita Halt.  Patinet does state that she will get nauseated sometimes with anesthesia.  Plan is to go home.  PCP - Dr. Lorie Phenix - Dr. Elease Hashimoto currently controls the patient's chronic pain medications.  We will provide the patient with her initial pain meds for the first couple of weeks and then have her follow back up with her PCP for continued chronic medications.  Signed electronically by Roberts Gaudy, PA-C

## 2012-04-30 ENCOUNTER — Encounter (HOSPITAL_COMMUNITY): Payer: Self-pay | Admitting: Pharmacy Technician

## 2012-05-06 ENCOUNTER — Encounter (HOSPITAL_COMMUNITY): Payer: Self-pay

## 2012-05-06 ENCOUNTER — Ambulatory Visit (HOSPITAL_COMMUNITY)
Admission: RE | Admit: 2012-05-06 | Discharge: 2012-05-06 | Disposition: A | Discharge status: Home / Self Care | Payer: Medicaid Other | Source: Ambulatory Visit | Attending: Orthopedic Surgery | Admitting: Orthopedic Surgery

## 2012-05-06 ENCOUNTER — Encounter (HOSPITAL_COMMUNITY)
Admission: RE | Admit: 2012-05-06 | Discharge: 2012-05-06 | Disposition: A | Discharge status: Home / Self Care | Payer: Medicaid Other | Source: Ambulatory Visit | Attending: Orthopedic Surgery | Admitting: Orthopedic Surgery

## 2012-05-06 DIAGNOSIS — Z01812 Encounter for preprocedural laboratory examination: Secondary | ICD-10-CM | POA: Insufficient documentation

## 2012-05-06 HISTORY — DX: Personal history of other medical treatment: Z92.89

## 2012-05-06 HISTORY — PX: SHOULDER ARTHROSCOPY: SHX128

## 2012-05-06 HISTORY — DX: Anemia, unspecified: D64.9

## 2012-05-06 HISTORY — PX: CARPAL TUNNEL RELEASE: SHX101

## 2012-05-06 LAB — APTT: aPTT: 44 seconds — ABNORMAL HIGH (ref 24–37)

## 2012-05-06 LAB — URINALYSIS, ROUTINE W REFLEX MICROSCOPIC
Bilirubin Urine: NEGATIVE
Nitrite: NEGATIVE
Protein, ur: NEGATIVE mg/dL
Specific Gravity, Urine: 1.009 (ref 1.005–1.030)
Urobilinogen, UA: 0.2 mg/dL (ref 0.0–1.0)

## 2012-05-06 LAB — CBC
MCHC: 33.9 g/dL (ref 30.0–36.0)
Platelets: 292 10*3/uL (ref 150–400)
RDW: 13.2 % (ref 11.5–15.5)

## 2012-05-06 LAB — COMPREHENSIVE METABOLIC PANEL
ALT: 28 U/L (ref 0–35)
Albumin: 3.7 g/dL (ref 3.5–5.2)
Alkaline Phosphatase: 105 U/L (ref 39–117)
Calcium: 9.4 mg/dL (ref 8.4–10.5)
Potassium: 3.9 mEq/L (ref 3.5–5.1)
Sodium: 138 mEq/L (ref 135–145)
Total Protein: 6.8 g/dL (ref 6.0–8.3)

## 2012-05-06 LAB — ABO/RH: ABO/RH(D): AB POS

## 2012-05-06 LAB — SURGICAL PCR SCREEN
MRSA, PCR: NEGATIVE
Staphylococcus aureus: NEGATIVE

## 2012-05-06 LAB — URINE MICROSCOPIC-ADD ON

## 2012-05-06 NOTE — Patient Instructions (Addendum)
20 SERITA DEGROOTE  05/06/2012   Your procedure is scheduled on:   3-3--2014  Report to [facility] at        0940 AM .  Call this number if you have problems the morning of surgery: [phone]  Or Presurgical Testing [phone]([place])      Do not eat food:After Midnight.    Take these medicines the morning of surgery with A SIP OF WATER: Alprazolam(if needed). Wellbutrin. Metoprolol. Oxycodone. Tylenol.   Do not wear jewelry, make-up or nail polish.  Do not wear lotions, powders, or perfumes. You may wear deodorant.  Do not shave 12 hours prior to first CHG shower(legs and under arms).(face and neck okay.)  Do not bring valuables to the hospital.  Contacts, dentures or bridgework,body piercing,  may not be worn into surgery.  Leave suitcase in the car. After surgery it may be brought to your room.  For patients admitted to the hospital, checkout time is 11:00 AM the day of discharge.   Patients discharged the day of surgery will not be allowed to drive home. Must have responsible person with you x 24 hours once discharged.  Name and phone number of your driver: boyfriend- Gene Colee- [phone] cell  Special Instructions:Antibacterial soap Shower : see special instructions.  Please read over the following fact sheets that you were given: MRSA Information, Blood Transfusion fact sheet, Incentive Spirometry Instruction.    Failure to follow these instructions may result in Cancellation of your surgery.   Patient signature_______________________________________________________

## 2012-05-06 NOTE — Pre-Procedure Instructions (Addendum)
05-06-12 EKG 6'13/ CXR 7'13 -Epic. R hip xray done today. 05-07-12 1520 Patient made aware of need for "Contact Isolation" on arrival due to hx. "ESBL"- Extended Spectrum Beta Lactamase. WKennon Portela [id number] Pt. Left voice message of change of surgery time to 1440 and ask to arrive by 1140 AM to short stay. May have Clear liquids to 0800 AM-no solid foods. Asked to call to confirm information. W. Maytte Jacot,RN 05-10-12 1110 Pt. Contacted and confirm change of surgery time and arrival time of 1140 AM with. W. Kennon Portela

## 2012-05-08 NOTE — Progress Notes (Signed)
FYI- Patient to be on contact Isolation- history of "ESBL"-Extended Spectrum Beta Lactamase. Pt. Made aware of this. W. Kennon Portela

## 2012-05-13 ENCOUNTER — Ambulatory Visit (HOSPITAL_COMMUNITY): Payer: Medicaid Other | Admitting: Anesthesiology

## 2012-05-13 ENCOUNTER — Ambulatory Visit (HOSPITAL_COMMUNITY): Payer: Medicaid Other

## 2012-05-13 ENCOUNTER — Encounter (HOSPITAL_COMMUNITY)
Admission: RE | Disposition: A | Discharge status: Home Health | Payer: Self-pay | Source: Ambulatory Visit | Attending: Orthopedic Surgery

## 2012-05-13 ENCOUNTER — Encounter (HOSPITAL_COMMUNITY): Payer: Self-pay | Admitting: Anesthesiology

## 2012-05-13 ENCOUNTER — Inpatient Hospital Stay (HOSPITAL_COMMUNITY)
Admission: RE | Admit: 2012-05-13 | Discharge: 2012-05-18 | DRG: 470 | Disposition: A | Discharge status: Home Health | Payer: Medicaid Other | Source: Ambulatory Visit | Attending: Orthopedic Surgery | Admitting: Orthopedic Surgery

## 2012-05-13 ENCOUNTER — Encounter (HOSPITAL_COMMUNITY): Payer: Self-pay | Admitting: *Deleted

## 2012-05-13 DIAGNOSIS — F411 Generalized anxiety disorder: Secondary | ICD-10-CM | POA: Diagnosis present

## 2012-05-13 DIAGNOSIS — Z981 Arthrodesis status: Secondary | ICD-10-CM

## 2012-05-13 DIAGNOSIS — K219 Gastro-esophageal reflux disease without esophagitis: Secondary | ICD-10-CM | POA: Diagnosis present

## 2012-05-13 DIAGNOSIS — F329 Major depressive disorder, single episode, unspecified: Secondary | ICD-10-CM | POA: Diagnosis present

## 2012-05-13 DIAGNOSIS — I10 Essential (primary) hypertension: Secondary | ICD-10-CM | POA: Diagnosis present

## 2012-05-13 DIAGNOSIS — D62 Acute posthemorrhagic anemia: Secondary | ICD-10-CM

## 2012-05-13 DIAGNOSIS — J45909 Unspecified asthma, uncomplicated: Secondary | ICD-10-CM | POA: Diagnosis present

## 2012-05-13 DIAGNOSIS — M169 Osteoarthritis of hip, unspecified: Principal | ICD-10-CM | POA: Diagnosis present

## 2012-05-13 DIAGNOSIS — Z96649 Presence of unspecified artificial hip joint: Secondary | ICD-10-CM

## 2012-05-13 DIAGNOSIS — R5082 Postprocedural fever: Secondary | ICD-10-CM | POA: Diagnosis not present

## 2012-05-13 DIAGNOSIS — F3289 Other specified depressive episodes: Secondary | ICD-10-CM | POA: Diagnosis present

## 2012-05-13 DIAGNOSIS — E871 Hypo-osmolality and hyponatremia: Secondary | ICD-10-CM

## 2012-05-13 DIAGNOSIS — M1610 Unilateral primary osteoarthritis, unspecified hip: Principal | ICD-10-CM | POA: Diagnosis present

## 2012-05-13 HISTORY — PX: TOTAL HIP ARTHROPLASTY: SHX124

## 2012-05-13 LAB — TYPE AND SCREEN
ABO/RH(D): AB POS
Antibody Screen: NEGATIVE

## 2012-05-13 SURGERY — ARTHROPLASTY, HIP, TOTAL, ANTERIOR APPROACH
Anesthesia: General | Site: Hip | Laterality: Right | Wound class: Clean

## 2012-05-13 MED ORDER — MENTHOL 3 MG MT LOZG
1.0000 | LOZENGE | OROMUCOSAL | Status: DC | PRN
  Filled 2012-05-13: qty 9

## 2012-05-13 MED ORDER — EPHEDRINE SULFATE 50 MG/ML IJ SOLN
INTRAMUSCULAR | Status: DC | PRN
  Administered 2012-05-13 (×3): 10 mg via INTRAVENOUS

## 2012-05-13 MED ORDER — DOCUSATE SODIUM 100 MG PO CAPS
100.0000 mg | ORAL_CAPSULE | Freq: Two times a day (BID) | ORAL | Status: DC
  Administered 2012-05-13 – 2012-05-18 (×10): 100 mg via ORAL

## 2012-05-13 MED ORDER — ACETAMINOPHEN 10 MG/ML IV SOLN
1000.0000 mg | Freq: Once | INTRAVENOUS | Status: AC
  Administered 2012-05-13: 1000 mg via INTRAVENOUS

## 2012-05-13 MED ORDER — ROCURONIUM BROMIDE 100 MG/10ML IV SOLN
INTRAVENOUS | Status: DC | PRN
  Administered 2012-05-13: 30 mg via INTRAVENOUS
  Administered 2012-05-13: 10 mg via INTRAVENOUS

## 2012-05-13 MED ORDER — HYDROMORPHONE HCL PF 1 MG/ML IJ SOLN
INTRAMUSCULAR | Status: AC
  Filled 2012-05-13: qty 1

## 2012-05-13 MED ORDER — FENTANYL CITRATE 0.05 MG/ML IJ SOLN
INTRAMUSCULAR | Status: DC | PRN
  Administered 2012-05-13 (×2): 50 ug via INTRAVENOUS
  Administered 2012-05-13: 75 ug via INTRAVENOUS

## 2012-05-13 MED ORDER — SODIUM CHLORIDE 0.9 % IJ SOLN
INTRAMUSCULAR | Status: DC | PRN
  Administered 2012-05-13: 50 mL via INTRAVENOUS

## 2012-05-13 MED ORDER — DEXAMETHASONE SODIUM PHOSPHATE 10 MG/ML IJ SOLN
INTRAMUSCULAR | Status: DC | PRN
  Administered 2012-05-13: 10 mg via INTRAVENOUS

## 2012-05-13 MED ORDER — STERILE WATER FOR IRRIGATION IR SOLN
Status: DC | PRN
  Administered 2012-05-13: 3000 mL

## 2012-05-13 MED ORDER — GLYCOPYRROLATE 0.2 MG/ML IJ SOLN
INTRAMUSCULAR | Status: DC | PRN
  Administered 2012-05-13: 0.6 mg via INTRAVENOUS

## 2012-05-13 MED ORDER — ONDANSETRON HCL 4 MG/2ML IJ SOLN
INTRAMUSCULAR | Status: DC | PRN
  Administered 2012-05-13: 4 mg via INTRAVENOUS

## 2012-05-13 MED ORDER — OXYCODONE HCL 5 MG PO TABS
5.0000 mg | ORAL_TABLET | ORAL | Status: DC | PRN
  Administered 2012-05-13: 15 mg via ORAL
  Administered 2012-05-13: 10 mg via ORAL
  Administered 2012-05-14 (×5): 15 mg via ORAL
  Administered 2012-05-14: 5 mg via ORAL
  Administered 2012-05-14 – 2012-05-15 (×2): 15 mg via ORAL
  Administered 2012-05-15: 20 mg via ORAL
  Administered 2012-05-15 (×2): 15 mg via ORAL
  Administered 2012-05-16: 20 mg via ORAL
  Administered 2012-05-16: 10 mg via ORAL
  Administered 2012-05-16: 20 mg via ORAL
  Administered 2012-05-16 – 2012-05-18 (×9): 10 mg via ORAL
  Administered 2012-05-18: 15 mg via ORAL
  Filled 2012-05-13: qty 2
  Filled 2012-05-13: qty 3
  Filled 2012-05-13 (×3): qty 2
  Filled 2012-05-13: qty 3
  Filled 2012-05-13 (×2): qty 2
  Filled 2012-05-13 (×3): qty 3
  Filled 2012-05-13: qty 2
  Filled 2012-05-13: qty 1
  Filled 2012-05-13: qty 2
  Filled 2012-05-13 (×2): qty 3
  Filled 2012-05-13: qty 4
  Filled 2012-05-13: qty 2
  Filled 2012-05-13: qty 4
  Filled 2012-05-13 (×2): qty 3
  Filled 2012-05-13 (×3): qty 2
  Filled 2012-05-13: qty 3
  Filled 2012-05-13: qty 2
  Filled 2012-05-13: qty 4

## 2012-05-13 MED ORDER — SCOPOLAMINE 1 MG/3DAYS TD PT72
MEDICATED_PATCH | TRANSDERMAL | Status: DC | PRN
  Administered 2012-05-13: 1 via TRANSDERMAL

## 2012-05-13 MED ORDER — PROMETHAZINE HCL 25 MG/ML IJ SOLN: 6.2500 mg | INTRAMUSCULAR | Status: DC | PRN

## 2012-05-13 MED ORDER — BUPROPION HCL ER (SR) 150 MG PO TB12
150.0000 mg | ORAL_TABLET | Freq: Two times a day (BID) | ORAL | Status: DC
  Administered 2012-05-13 – 2012-05-18 (×10): 150 mg via ORAL
  Filled 2012-05-13 (×12): qty 1

## 2012-05-13 MED ORDER — METOCLOPRAMIDE HCL 10 MG PO TABS: 5.0000 mg | ORAL_TABLET | Freq: Three times a day (TID) | ORAL | Status: DC | PRN

## 2012-05-13 MED ORDER — CYCLOBENZAPRINE HCL 10 MG PO TABS
10.0000 mg | ORAL_TABLET | Freq: Three times a day (TID) | ORAL | Status: DC | PRN
  Administered 2012-05-14 – 2012-05-16 (×6): 10 mg via ORAL
  Filled 2012-05-13 (×6): qty 1

## 2012-05-13 MED ORDER — POLYETHYLENE GLYCOL 3350 17 G PO PACK
17.0000 g | PACK | Freq: Every day | ORAL | Status: DC | PRN
  Administered 2012-05-14 – 2012-05-16 (×2): 17 g via ORAL

## 2012-05-13 MED ORDER — ACETAMINOPHEN 10 MG/ML IV SOLN
INTRAVENOUS | Status: AC
  Filled 2012-05-13: qty 100

## 2012-05-13 MED ORDER — CEFAZOLIN SODIUM 1-5 GM-% IV SOLN
1.0000 g | Freq: Four times a day (QID) | INTRAVENOUS | Status: AC
  Administered 2012-05-13 – 2012-05-14 (×2): 1 g via INTRAVENOUS
  Filled 2012-05-13 (×4): qty 50

## 2012-05-13 MED ORDER — PHENOL 1.4 % MT LIQD
1.0000 | OROMUCOSAL | Status: DC | PRN
  Filled 2012-05-13: qty 177

## 2012-05-13 MED ORDER — ALBUTEROL SULFATE HFA 108 (90 BASE) MCG/ACT IN AERS: 2.0000 | INHALATION_SPRAY | Freq: Four times a day (QID) | RESPIRATORY_TRACT | Status: DC | PRN

## 2012-05-13 MED ORDER — FLEET ENEMA 7-19 GM/118ML RE ENEM: 1.0000 | ENEMA | Freq: Once | RECTAL | Status: AC | PRN

## 2012-05-13 MED ORDER — METHOCARBAMOL 100 MG/ML IJ SOLN: 500.0000 mg | Freq: Four times a day (QID) | INTRAVENOUS | Status: DC | PRN

## 2012-05-13 MED ORDER — PROPOFOL 10 MG/ML IV BOLUS
INTRAVENOUS | Status: DC | PRN
  Administered 2012-05-13: 170 mg via INTRAVENOUS

## 2012-05-13 MED ORDER — 0.9 % SODIUM CHLORIDE (POUR BTL) OPTIME
TOPICAL | Status: DC | PRN
  Administered 2012-05-13: 1000 mL

## 2012-05-13 MED ORDER — ONDANSETRON HCL 4 MG PO TABS: 4.0000 mg | ORAL_TABLET | Freq: Four times a day (QID) | ORAL | Status: DC | PRN

## 2012-05-13 MED ORDER — HYDROMORPHONE HCL PF 1 MG/ML IJ SOLN
INTRAMUSCULAR | Status: AC
Start: 2012-05-13 — End: 2012-05-14
  Filled 2012-05-13: qty 1

## 2012-05-13 MED ORDER — METHOCARBAMOL 500 MG PO TABS
500.0000 mg | ORAL_TABLET | Freq: Four times a day (QID) | ORAL | Status: DC | PRN
  Administered 2012-05-13 – 2012-05-18 (×4): 500 mg via ORAL
  Filled 2012-05-13 (×5): qty 1

## 2012-05-13 MED ORDER — ACETAMINOPHEN 650 MG RE SUPP: 650.0000 mg | Freq: Four times a day (QID) | RECTAL | Status: DC | PRN

## 2012-05-13 MED ORDER — DIPHENHYDRAMINE HCL 12.5 MG/5ML PO ELIX: 12.5000 mg | ORAL_SOLUTION | ORAL | Status: DC | PRN

## 2012-05-13 MED ORDER — RIVAROXABAN 10 MG PO TABS
10.0000 mg | ORAL_TABLET | Freq: Every day | ORAL | Status: DC
  Administered 2012-05-14 – 2012-05-18 (×5): 10 mg via ORAL
  Filled 2012-05-13 (×7): qty 1

## 2012-05-13 MED ORDER — LIDOCAINE HCL (CARDIAC) 20 MG/ML IV SOLN
INTRAVENOUS | Status: DC | PRN
  Administered 2012-05-13: 100 mg via INTRAVENOUS

## 2012-05-13 MED ORDER — SUCCINYLCHOLINE CHLORIDE 20 MG/ML IJ SOLN
INTRAMUSCULAR | Status: DC | PRN
  Administered 2012-05-13: 100 mg via INTRAVENOUS

## 2012-05-13 MED ORDER — CEFAZOLIN SODIUM-DEXTROSE 2-3 GM-% IV SOLR
2.0000 g | INTRAVENOUS | Status: AC
  Administered 2012-05-13: 2 g via INTRAVENOUS

## 2012-05-13 MED ORDER — ACETAMINOPHEN 10 MG/ML IV SOLN
1000.0000 mg | Freq: Four times a day (QID) | INTRAVENOUS | Status: AC
  Administered 2012-05-13 – 2012-05-14 (×4): 1000 mg via INTRAVENOUS
  Filled 2012-05-13 (×7): qty 100

## 2012-05-13 MED ORDER — TRAMADOL HCL 50 MG PO TABS
50.0000 mg | ORAL_TABLET | Freq: Four times a day (QID) | ORAL | Status: DC | PRN
  Administered 2012-05-16: 100 mg via ORAL
  Filled 2012-05-13: qty 2

## 2012-05-13 MED ORDER — SCOPOLAMINE 1 MG/3DAYS TD PT72
MEDICATED_PATCH | TRANSDERMAL | Status: AC
  Filled 2012-05-13: qty 1

## 2012-05-13 MED ORDER — HYDROMORPHONE HCL PF 1 MG/ML IJ SOLN
0.2500 mg | INTRAMUSCULAR | Status: AC | PRN
  Administered 2012-05-13 (×8): 0.5 mg via INTRAVENOUS

## 2012-05-13 MED ORDER — SODIUM CHLORIDE 0.9 % IV SOLN: INTRAVENOUS | Status: DC

## 2012-05-13 MED ORDER — METOPROLOL SUCCINATE ER 50 MG PO TB24
50.0000 mg | ORAL_TABLET | Freq: Every day | ORAL | Status: DC
  Administered 2012-05-15 – 2012-05-18 (×4): 50 mg via ORAL
  Filled 2012-05-13 (×6): qty 1

## 2012-05-13 MED ORDER — MIDAZOLAM HCL 5 MG/5ML IJ SOLN
INTRAMUSCULAR | Status: DC | PRN
  Administered 2012-05-13: 2 mg via INTRAVENOUS

## 2012-05-13 MED ORDER — ONDANSETRON HCL 4 MG/2ML IJ SOLN
4.0000 mg | Freq: Four times a day (QID) | INTRAMUSCULAR | Status: DC | PRN
  Administered 2012-05-16: 4 mg via INTRAVENOUS
  Filled 2012-05-13: qty 2

## 2012-05-13 MED ORDER — HYDROMORPHONE HCL PF 1 MG/ML IJ SOLN: 0.5000 mg | INTRAMUSCULAR | Status: DC | PRN

## 2012-05-13 MED ORDER — MORPHINE SULFATE 2 MG/ML IJ SOLN
1.0000 mg | INTRAMUSCULAR | Status: DC | PRN
  Administered 2012-05-14: 1 mg via INTRAVENOUS
  Administered 2012-05-15 (×4): 2 mg via INTRAVENOUS
  Filled 2012-05-13 (×5): qty 1

## 2012-05-13 MED ORDER — KCL IN DEXTROSE-NACL 20-5-0.9 MEQ/L-%-% IV SOLN
INTRAVENOUS | Status: DC
  Administered 2012-05-14 – 2012-05-15 (×2): via INTRAVENOUS
  Filled 2012-05-13 (×7): qty 1000

## 2012-05-13 MED ORDER — BISACODYL 10 MG RE SUPP: 10.0000 mg | Freq: Every day | RECTAL | Status: DC | PRN

## 2012-05-13 MED ORDER — CEFAZOLIN SODIUM-DEXTROSE 2-3 GM-% IV SOLR
INTRAVENOUS | Status: AC
  Filled 2012-05-13: qty 50

## 2012-05-13 MED ORDER — ALPRAZOLAM 1 MG PO TABS
1.0000 mg | ORAL_TABLET | Freq: Two times a day (BID) | ORAL | Status: DC | PRN
  Administered 2012-05-13: 1 mg via ORAL
  Administered 2012-05-14 – 2012-05-18 (×3): 2 mg via ORAL
  Filled 2012-05-13: qty 1
  Filled 2012-05-13 (×3): qty 2

## 2012-05-13 MED ORDER — ACETAMINOPHEN 325 MG PO TABS
650.0000 mg | ORAL_TABLET | Freq: Four times a day (QID) | ORAL | Status: DC | PRN
  Administered 2012-05-15 (×2): 650 mg via ORAL
  Filled 2012-05-13 (×3): qty 2

## 2012-05-13 MED ORDER — NEOSTIGMINE METHYLSULFATE 1 MG/ML IJ SOLN
INTRAMUSCULAR | Status: DC | PRN
  Administered 2012-05-13: 4 mg via INTRAVENOUS

## 2012-05-13 MED ORDER — METOCLOPRAMIDE HCL 5 MG/ML IJ SOLN
5.0000 mg | Freq: Three times a day (TID) | INTRAMUSCULAR | Status: DC | PRN
  Administered 2012-05-13: 10 mg via INTRAVENOUS
  Filled 2012-05-13 (×2): qty 2

## 2012-05-13 MED ORDER — BUPIVACAINE LIPOSOME 1.3 % IJ SUSP
20.0000 mL | Freq: Once | INTRAMUSCULAR | Status: AC
  Administered 2012-05-13: 20 mL
  Filled 2012-05-13: qty 20

## 2012-05-13 MED ORDER — LACTATED RINGERS IV SOLN
INTRAVENOUS | Status: DC
  Administered 2012-05-13: 1000 mL via INTRAVENOUS
  Administered 2012-05-13 (×2): via INTRAVENOUS

## 2012-05-13 SURGICAL SUPPLY — 46 items
BAG SPEC THK2 15X12 ZIP CLS (MISCELLANEOUS) ×2
BAG ZIPLOCK 12X15 (MISCELLANEOUS) ×4 IMPLANT
BLADE SAW SGTL 18X1.27X75 (BLADE) ×2 IMPLANT
CLOTH BEACON ORANGE TIMEOUT ST (SAFETY) ×2 IMPLANT
CLSR STERI-STRIP ANTIMIC 1/2X4 (GAUZE/BANDAGES/DRESSINGS) ×4 IMPLANT
DECANTER SPIKE VIAL GLASS SM (MISCELLANEOUS) ×2 IMPLANT
DRAPE C-ARM 42X72 X-RAY (DRAPES) ×2 IMPLANT
DRAPE INCISE 23X17 IOBAN STRL (DRAPES) ×1
DRAPE INCISE 23X17 STRL (DRAPES) IMPLANT
DRAPE INCISE IOBAN 23X17 STRL (DRAPES) ×1 IMPLANT
DRAPE STERI IOBAN 125X83 (DRAPES) ×2 IMPLANT
DRAPE U-SHAPE 47X51 STRL (DRAPES) ×6 IMPLANT
DRSG ADAPTIC 3X8 NADH LF (GAUZE/BANDAGES/DRESSINGS) ×2 IMPLANT
DRSG MEPILEX BORDER 4X4 (GAUZE/BANDAGES/DRESSINGS) ×1 IMPLANT
DRSG MEPILEX BORDER 4X8 (GAUZE/BANDAGES/DRESSINGS) ×2 IMPLANT
DURAPREP 26ML APPLICATOR (WOUND CARE) ×1 IMPLANT
ELECT BLADE 6.5 EXT (BLADE) ×2 IMPLANT
ELECT REM PT RETURN 9FT ADLT (ELECTROSURGICAL) ×2
ELECTRODE REM PT RTRN 9FT ADLT (ELECTROSURGICAL) ×1 IMPLANT
EVACUATOR 1/8 PVC DRAIN (DRAIN) ×1 IMPLANT
FACESHIELD LNG OPTICON STERILE (SAFETY) ×8 IMPLANT
GLOVE BIO SURGEON STRL SZ7.5 (GLOVE) ×2 IMPLANT
GLOVE BIO SURGEON STRL SZ8 (GLOVE) ×4 IMPLANT
GLOVE BIOGEL PI IND STRL 8 (GLOVE) ×2 IMPLANT
GLOVE BIOGEL PI INDICATOR 8 (GLOVE) ×2
GOWN STRL NON-REIN LRG LVL3 (GOWN DISPOSABLE) ×2 IMPLANT
GOWN STRL REIN XL XLG (GOWN DISPOSABLE) ×2 IMPLANT
KIT BASIN OR (CUSTOM PROCEDURE TRAY) ×2 IMPLANT
NDL SAFETY ECLIPSE 18X1.5 (NEEDLE) ×1 IMPLANT
NEEDLE HYPO 18GX1.5 SHARP (NEEDLE) ×2
PACK TOTAL JOINT (CUSTOM PROCEDURE TRAY) ×2 IMPLANT
PADDING CAST COTTON 6X4 STRL (CAST SUPPLIES) ×2 IMPLANT
SCRUB PCMX 4 OZ (MISCELLANEOUS) ×2 IMPLANT
SPONGE GAUZE 4X4 12PLY (GAUZE/BANDAGES/DRESSINGS) ×2 IMPLANT
STRIP CLOSURE SKIN 1/2X4 (GAUZE/BANDAGES/DRESSINGS) ×1 IMPLANT
SUCTION FRAZIER 12FR DISP (SUCTIONS) ×2 IMPLANT
SUT ETHIBOND NAB CT1 #1 30IN (SUTURE) ×6 IMPLANT
SUT MNCRL AB 4-0 PS2 18 (SUTURE) ×2 IMPLANT
SUT VIC AB 1 CT1 27 (SUTURE) ×2
SUT VIC AB 1 CT1 27XBRD ANTBC (SUTURE) ×1 IMPLANT
SUT VIC AB 2-0 CT1 27 (SUTURE) ×4
SUT VIC AB 2-0 CT1 TAPERPNT 27 (SUTURE) ×2 IMPLANT
SUT VLOC 180 0 24IN GS25 (SUTURE) ×2 IMPLANT
SYR 50ML LL SCALE MARK (SYRINGE) ×2 IMPLANT
TOWEL OR 17X26 10 PK STRL BLUE (TOWEL DISPOSABLE) ×4 IMPLANT
TRAY FOLEY CATH 14FRSI W/METER (CATHETERS) ×2 IMPLANT

## 2012-05-13 NOTE — Plan of Care (Signed)
Problem: Consults Goal: Diagnosis- Total Joint Replacement Right  Anterior hip

## 2012-05-13 NOTE — Anesthesia Preprocedure Evaluation (Addendum)
Anesthesia Evaluation  Patient identified by MRN, date of birth, ID band Patient awake    Reviewed: Allergy & Precautions, H&P , NPO status , Patient's Chart, lab work & pertinent test results  History of Anesthesia Complications (+) PONV  Airway Mallampati: II TM Distance: >3 FB Neck ROM: Full    Dental no notable dental hx.    Pulmonary asthma ,  breath sounds clear to auscultation  Pulmonary exam normal       Cardiovascular hypertension, Pt. on medications and Pt. on home beta blockers negative cardio ROS  Rhythm:Regular Rate:Normal     Neuro/Psych  Headaches, PSYCHIATRIC DISORDERS Anxiety Depression    GI/Hepatic Neg liver ROS, GERD-  Medicated,  Endo/Other  negative endocrine ROS  Renal/GU negative Renal ROS  negative genitourinary   Musculoskeletal negative musculoskeletal ROS (+)   Abdominal   Peds negative pediatric ROS (+)  Hematology negative hematology ROS (+)   Anesthesia Other Findings   Reproductive/Obstetrics negative OB ROS                           Anesthesia Physical Anesthesia Plan  ASA: II  Anesthesia Plan: General   Post-op Pain Management:    Induction: Intravenous  Airway Management Planned: Oral ETT  Additional Equipment:   Intra-op Plan:   Post-operative Plan: Extubation in OR  Informed Consent: I have reviewed the patients History and Physical, chart, labs and discussed the procedure including the risks, benefits and alternatives for the proposed anesthesia with the patient or authorized representative who has indicated his/her understanding and acceptance.   Dental advisory given  Plan Discussed with: CRNA  Anesthesia Plan Comments: (She has ray cages in her lumbar spine. Discussed r/b general versus spinal. Patient prefers general.)       Anesthesia Quick Evaluation

## 2012-05-13 NOTE — Transfer of Care (Signed)
Immediate Anesthesia Transfer of Care Note  Patient: [name]  Procedure(s) Performed: Procedure(s): TOTAL HIP ARTHROPLASTY ANTERIOR APPROACH (Right)  Patient Location: PACU  Anesthesia Type:General  Level of Consciousness: awake, sedated and patient cooperative  Airway & Oxygen Therapy: Patient Spontanous Breathing and Patient connected to face mask oxygen  Post-op Assessment: Report given to PACU RN and Post -op Vital signs reviewed and stable  Post vital signs: Reviewed and stable  Complications: No apparent anesthesia complications

## 2012-05-13 NOTE — Anesthesia Procedure Notes (Signed)
Procedure Name: Intubation Date/Time: 05/13/2012 2:17 PM Performed by: Doran Clay Pre-anesthesia Checklist: Patient identified, Timeout performed, Emergency Drugs available, Suction available and Patient being monitored Patient Re-evaluated:Patient Re-evaluated prior to inductionOxygen Delivery Method: Circle system utilized Preoxygenation: Pre-oxygenation with 100% oxygen Intubation Type: IV induction Laryngoscope Size: Mac and 3 Grade View: Grade I Tube type: Oral Tube size: 7.0 mm Number of attempts: 1 Airway Equipment and Method: Video-laryngoscopy Placement Confirmation: ETT inserted through vocal cords under direct vision,  breath sounds checked- equal and bilateral and positive ETCO2 Secured at: 22 cm Tube secured with: Tape Dental Injury: Teeth and Oropharynx as per pre-operative assessment

## 2012-05-13 NOTE — H&P (View-Only) (Signed)
[name]  DOB: 01/10/1961 Married / Language: English / Race: White Female  Date of Admission:  05/13/2012  Chief Complaint:  Right Hip Pain  History of Present Illness The patient is a 51 year old female who comes in for a preoperative History and Physical. The patient is scheduled for a right total hip arthroplasty (Anterior Approach) to be performed by Dr. Frank V. Aluisio, MD at [facility] on 05/13/2012. The patient is a 51 year old female presenting for a post-operative visit out from right hip arthroscopy. The patient states that she is doing poorly at this time. Describes their pain as severe (Patient states she has pain in her groin that radiates down her entire leg. She said that it is constant. She is concerned that she may have torn something again. They are currently on Percocet & Ibuprofen for their pain. The patient feels that they are progressing poorly at this time. Unfortunately, her hip pain is getting worse. At the time of arthroscopy about eight months ago it was noted that she did have degenerative change especially the anterior aspect of the acetabulum and the femoral head. She says she has pain at all times. It is limiting what she can and cannot do. She takes care of her grandchildren and it is getting increasingly difficult to do so. She is ready to get the right hip fixed at this time. They have been treated conservatively in the past for the above stated problem and despite conservative measures, they continue to have progressive pain and severe functional limitations and dysfunction. They have failed non-operative management including home exercise, medications, and injections. It is felt that they would benefit from undergoing total joint replacement. Risks and benefits of the procedure have been discussed with the patient and they elect to proceed with surgery. There are no active contraindications to surgery such as ongoing infection or  rapidly progressive neurological disease.   Problem List Osteoarthritis, Hip (715.35)  Allergies Codeine/Codeine Derivatives. Hives. Tylenol #3 Shellfish. 10/01/2007 Clindamycin HCl *ANTI-INFECTIVE AGENTS - MISC.*. Hives. Sulfa Drugs. Nausea, Vomiting.   Family History Mother. Deceased. age 42, Brain Aneurysm Maternal Grandmother. Deceased. Heart Anuerysm   Social History No alcohol use Tobacco use. Never smoker. Post-Surgical Plans. Plan is for home. Current occupation. Nurse Current work status. Disabled.   Medication History Xanax ( Oral) Specific dose unknown - Active. Metoprolol Succinate ( Oral) Specific dose unknown - Active. Ibuprofen 200 (200MG Tablet, Oral) Active. Albuterol Sulfate ( Inhalation) Specific dose unknown - Active. Wellbutrin ( Oral) Specific dose unknown - Active. Advair Diskus ( Inhalation) Specific dose unknown - Active. Methocarbamol (500MG Tablet, 1 Oral three times daily, as needed, Taken starting 02/13/2011) Active. Percocet (10-325MG Tablet, 1 Oral two times daily, as needed, Taken 05/11/2011 to 11/09/2011) Inactive.   Pregnancy  Pregnancy Status. NO   Past Surgical History Hysterectomy (not due to cancer) - Partial Tonsillectomy Gallbladder Surgery Back Surgery. T10-L4 fusion Right. hip artho. Right wrist CTR Arthroscopic Shoulder Surgery - Right   Medical History Asthma Bronchitis Anxiety Disorder. Panic Attacks Hypertension Arrhythmia. Inverted T-Wave on EKG Gastric Ulcer. Past History History of Elevated Liver Enzymes   Review of Systems General:Not Present- Chills, Fever, Night Sweats, Fatigue, Weight Gain, Weight Loss and Memory Loss. Skin:Not Present- Hives, Itching, Rash, Eczema and Lesions. HEENT:Not Present- Tinnitus, Headache, Double Vision, Visual Loss, Hearing Loss and Dentures. Respiratory:Not Present- Shortness of breath with exertion, Shortness of breath at rest, Allergies,  Coughing up blood and Chronic Cough. Cardiovascular:Not Present-   Chest Pain, Racing/skipping heartbeats, Difficulty Breathing Lying Down, Murmur, Swelling and Palpitations. Gastrointestinal:Not Present- Bloody Stool, Heartburn, Abdominal Pain, Vomiting, Nausea, Constipation, Diarrhea, Difficulty Swallowing, Jaundice and Loss of appetitie. Female Genitourinary:Not Present- Blood in Urine, Urinary frequency, Weak urinary stream, Discharge, Flank Pain, Incontinence, Painful Urination, Urgency, Urinary Retention and Urinating at Night. Musculoskeletal:Present- Joint Pain. Not Present- Muscle Weakness, Muscle Pain, Joint Swelling, Back Pain, Morning Stiffness and Spasms. Neurological:Not Present- Tremor, Dizziness, Blackout spells, Paralysis, Difficulty with balance and Weakness. Psychiatric:Not Present- Insomnia.   Vitals Weight: 142 lb Height: 64 in Body Surface Area: 1.71 m Body Mass Index: 24.37 kg/m BP: 134/82 (Sitting, Right Arm, Standard)    Physical Exam The physical exam findings are as follows:   General Mental Status - Alert, cooperative and good historian. General Appearance- pleasant. Not in acute distress. Orientation- Oriented X3. Build & Nutrition- Well nourished and Well developed.   Head and Neck Head- normocephalic, atraumatic . Neck Global Assessment- supple. no bruit auscultated on the right and no bruit auscultated on the left.   Eye Pupil- Bilateral- Regular and Round. Motion- Bilateral- EOMI.   Chest and Lung Exam Auscultation: Breath sounds:- clear at anterior chest wall and - clear at posterior chest wall. Adventitious sounds:- No Adventitious sounds.   Cardiovascular Auscultation:Rhythm- Regular rate and rhythm. Heart Sounds- S1 WNL and S2 WNL. Murmurs & Other Heart Sounds:Auscultation of the heart reveals - No Murmurs.   Abdomen Palpation/Percussion:Tenderness- Abdomen is non-tender to palpation.  Rigidity (guarding)- Abdomen is soft. Auscultation:Auscultation of the abdomen reveals - Bowel sounds normal.   Female Genitourinary Not done, not pertinent to present illness  Musculoskeletal On exam, well developed female alert and oriented in no apparent distress. She still walks with a significantly antalgic gait. I can flex her hip to 100. No internal rotation. About 30 external rotation and 30 abduction with discomfort. The left hip has normal range of motion without discomfort. She still has some tenderness on the greater trochanter.  RADIOGRAPHS: AP pelvis and lateral of the hip show she has lost most of her superior lateral joint space on the right hip. There is a substantial difference in the x-rays now compared to x-rays last spring.  Assessment & Plan Osteoarthritis, Hip (715.35) Impression: Right Hip  Note: Plan is for a Right Total Hip Replacement by Dr. Aluisio.  Patinet does state that she will get nauseated sometimes with anesthesia.  Plan is to go home.  PCP - Dr. Nancy Maloney - Dr. Maloney currently controls the patient's chronic pain medications.  We will provide the patient with her initial pain meds for the first couple of weeks and then have her follow back up with her PCP for continued chronic medications.  Signed electronically by DREW L PERKINS, PA-C 

## 2012-05-13 NOTE — Interval H&P Note (Signed)
History and Physical Interval Note:  05/13/2012 1:12 PM  [name]  has presented today for surgery, with the diagnosis of Osteoarthritis of the Right Hip  The various methods of treatment have been discussed with the patient and family. After consideration of risks, benefits and other options for treatment, the patient has consented to  Procedure(s): TOTAL HIP ARTHROPLASTY ANTERIOR APPROACH (Right) as a surgical intervention .  The patient's history has been reviewed, patient examined, no change in status, stable for surgery.  I have reviewed the patient's chart and labs.  Questions were answered to the patient's satisfaction.     Loanne Drilling

## 2012-05-13 NOTE — Op Note (Signed)
OPERATIVE REPORT  PREOPERATIVE DIAGNOSIS: Osteoarthritis of the Right hip.   POSTOPERATIVE DIAGNOSIS: Osteoarthritis of the Right  hip.   PROCEDURE: Right total hip arthroplasty, anterior approach.   SURGEON: Ollen Gross, MD   ASSISTANT: [name], PA-C  ANESTHESIA:  General  ESTIMATED BLOOD LOSS:- 600 ml  DRAINS: Hemovac x1.   COMPLICATIONS: None   CONDITION: PACU - hemodynamically stable.   BRIEF CLINICAL NOTE: [name] is a 52 y.o. female who has advanced end-  stage arthritis of his Right  hip with progressively worsening pain and  dysfunction.The patient has failed nonoperative management and presents for  total hip arthroplasty.   PROCEDURE IN DETAIL: After successful administration of spinal  anesthetic, the traction boots for the [facility] bed were placed on both  feet and the patient was placed onto the [facility] bed, boots placed into the leg  holders. The Right hip was then isolated from the perineum with plastic  drapes and prepped and draped in the usual sterile fashion. ASIS and  greater trochanter were marked and a oblique incision was made, starting  at about 1 cm lateral and 2 cm distal to the ASIS and coursing towards  the anterior cortex of the femur. The skin was cut with a 10 blade  through subcutaneous tissue to the level of the fascia overlying the  tensor fascia lata muscle. The fascia was then incised in line with the  incision at the junction of the anterior third and posterior 2/3rd. The  muscle was teased off the fascia and then the interval between the TFL  and the rectus was developed. The Hohmann retractor was then placed at  the top of the femoral neck over the capsule. The vessels overlying the  capsule were cauterized and the fat on top of the capsule was removed.  A Hohmann retractor was then placed anterior underneath the rectus  femoris to give exposure to the entire anterior capsule. A T-shaped  capsulotomy was  performed. The edges were tagged and the femoral head  was identified.       Osteophytes are removed off the superior acetabulum.  The femoral neck was then cut in situ with an oscillating saw. Traction  was then applied to the left lower extremity utilizing the [facility]  traction. The femoral head was then removed. Retractors were placed  around the acetabulum and then circumferential removal of the labrum was  performed. Osteophytes were also removed. Reaming starts at 45 mm to  medialize and  Increased in 2 mm increments to 49 mm. We reamed in  approximately 40 degrees of abduction, 20 degrees anteversion. A 50 mm  pinnacle acetabular shell was then impacted in anatomic position under  fluoroscopic guidance with excellent purchase. I placed an additional dome screw. A 32 mm neutral + 4 marathon liner was then  placed into the acetabular shell.       The femoral lift was then placed along the lateral aspect of the femur  just distal to the vastus ridge. The leg was  externally rotated and capsule  was stripped off the inferior aspect of the femoral neck down to the  level of the lesser trochanter, this was done with electrocautery. The femur was lifted after this was performed. The  leg was then placed and extended in adducted position to essentially delivering the femur. We also removed the capsule superiorly and the  piriformis from the piriformis fossa to gain excellent exposure  of the  proximal femur. Rongeur was used to remove some cancellous bone to get  into the lateral portion of the proximal femur for placement of the  initial starter reamer. The starter broaches was placed  the starter broach  and was shown to go down the center of the canal. Broaching  with the  Corail system was then performed starting at size 8, coursing  Up to size 10. A size 10 had excellent torsional and rotational  and axial stability. The trial standard offset neck was then placed  with a 32 + 1 trial head.  The hip was then reduced. We confirmed that  the stem was in the canal both on AP and lateral x-rays. It also has excellent sizing. The hip was reduced with outstanding stability through full extension, full external rotation,  and then flexion in adduction internal rotation. AP pelvis was taken  and the leg lengths were measured and found to be exactly equal. Hip  was then dislocated again and the femoral head and neck removed. The  femoral broach was removed. Size 10 Corail stem with a standard offset  neck was then impacted into the femur following native anteversion. Has  excellent purchase in the canal. Excellent torsional and rotational and  axial stability. It is confirmed to be in the canal on AP and lateral  fluoroscopic views. The 32 + 1 ceramic head was placed and the hip  reduced with outstanding stability. Again AP pelvis was taken and it  confirmed that the leg lengths were equal. The wound was then copiously  irrigated with saline solution and the capsule reattached and repaired  with Ethibond suture.  20 mL of Exparel mixed with 50 mL of saline injected  into the capsule and into the edge of the tensor fascia lata as well as  subcutaneous tissue. The fascia overlying the tensor fascia lata was  then closed with a running #1 V-Loc. Subcu was closed with interrupted  2-0 Vicryl and subcuticular running 4-0 Monocryl. Incision was cleaned  and dried. Steri-Strips and a bulky sterile dressing applied. Hemovac  drain was hooked to suction and then he was awakened and transported to  recovery in stable condition.        Please note that a surgical assistant was a medical necessity for this procedure to perform it in a safe and expeditious manner. Assistant was necessary to provide appropriate retraction of vital neurovascular structures and to prevent femoral fracture and allow for anatomic placement of the prosthesis.  Ollen Gross, M.D.

## 2012-05-14 ENCOUNTER — Encounter (HOSPITAL_COMMUNITY): Payer: Self-pay | Admitting: Orthopedic Surgery

## 2012-05-14 DIAGNOSIS — E871 Hypo-osmolality and hyponatremia: Secondary | ICD-10-CM

## 2012-05-14 DIAGNOSIS — D62 Acute posthemorrhagic anemia: Secondary | ICD-10-CM

## 2012-05-14 HISTORY — DX: Acute posthemorrhagic anemia: D62

## 2012-05-14 HISTORY — DX: Hypo-osmolality and hyponatremia: E87.1

## 2012-05-14 LAB — BASIC METABOLIC PANEL
CO2: 24 mEq/L (ref 19–32)
GFR calc non Af Amer: >90 mL/min (ref >90)
Glucose, Bld: 168 mg/dL — ABNORMAL HIGH (ref 70–99)
Potassium: 4.4 mEq/L (ref 3.5–5.1)
Sodium: 132 mEq/L — ABNORMAL LOW (ref 135–145)

## 2012-05-14 LAB — CBC
Hemoglobin: 9.4 g/dL — ABNORMAL LOW (ref 12.0–15.0)
MCHC: 33.7 g/dL (ref 30.0–36.0)
Platelets: 285 10*3/uL (ref 150–400)
RBC: 3.22 MIL/uL — ABNORMAL LOW (ref 3.87–5.11)

## 2012-05-14 MED ORDER — SODIUM CHLORIDE 0.9 % IV SOLN
INTRAVENOUS | Status: AC
  Administered 2012-05-14: 10:00:00 via INTRAVENOUS

## 2012-05-14 MED ORDER — POLYSACCHARIDE IRON COMPLEX 150 MG PO CAPS
150.0000 mg | ORAL_CAPSULE | Freq: Every day | ORAL | Status: DC
  Administered 2012-05-14 – 2012-05-18 (×5): 150 mg via ORAL
  Filled 2012-05-14 (×5): qty 1

## 2012-05-14 NOTE — Care Management Note (Signed)
    Page 1 of 1   05/14/2012     11:36:59 AM   CARE MANAGEMENT NOTE 05/14/2012  Patient:  [name], [name]   Account Number:  [id number]  Date Initiated:  05/14/2012  Documentation initiated by:  Lorenda Ishihara  Subjective/Objective Assessment:   52 yo female admitted s/p Right total hip arthroplasty, anterior approach. PTA lived at home with boyfriend     Action/Plan:   Home when stable   Anticipated DC Date:  05/16/2012   Anticipated DC Plan:  HOME W HOME HEALTH SERVICES      DC Planning Services  CM consult      [facility] Choice  HOME HEALTH   Choice offered to / List presented to:  [name]        HH arranged  HH-2 PT      [facility] agency  [facility]   Status of service:  Completed, signed off Medicare Important Message given?   (If response is "NO", the following Medicare IM given date fields will be blank) Date Medicare IM given:   Date Additional Medicare IM given:    Discharge Disposition:  HOME W HOME HEALTH SERVICES  Per UR Regulation:  Reviewed for med. necessity/level of care/duration of stay  If discussed at Long Length of Stay Meetings, dates discussed:    Comments:

## 2012-05-14 NOTE — Progress Notes (Signed)
Physical Therapy Treatment Patient Details Name: [name] MRN: [id number] DOB: [birth year] Today's Date: 05/14/2012 Time: 7829-5621 PT Time Calculation (min): 12 min  PT Assessment / Plan / Recommendation Comments on Treatment Session  Progressing slowly. Pt reported increased pain with ambulation this session-RN made aware and brought pain meds at end of session. Recommend HHPT.     Follow Up Recommendations  Home health PT     Does the patient have the potential to tolerate intense rehabilitation     Barriers to Discharge        Equipment Recommendations  None recommended by PT    Recommendations for Other Services OT consult  Frequency 7X/week   Plan Discharge plan remains appropriate    Precautions / Restrictions Restrictions Weight Bearing Restrictions: No RLE Weight Bearing: Weight bearing as tolerated   Pertinent Vitals/Pain "15" R hip.    Mobility  Bed Mobility Bed Mobility: Sit to Supine Sit to Supine: 4: Min assist Details for Bed Mobility Assistance: Assist for R LE onto bed. Increased time. Transfers Transfers: Sit to Stand;Stand to Sit Sit to Stand: 4: Min assist;From chair/3-in-1 Stand to Sit: 4: Min assist;To bed Details for Transfer Assistance: VCs safety, technique, hand placement. Assist to rise, stabilize, control descent Ambulation/Gait Ambulation/Gait Assistance: 4: Min assist Ambulation Distance (Feet): 60 Feet Assistive device: Rolling walker Ambulation/Gait Assistance Details: VCs safety, technique, sequence. Slow gait speed. Pt reports increased pain with ambulation this session.  Gait Pattern: Step-to pattern;Antalgic;Decreased stride length;Decreased step length - right    Exercises     PT Diagnosis: Difficulty walking;Abnormality of gait;Acute pain  PT Problem List: Decreased strength;Decreased range of motion;Decreased activity tolerance;Decreased mobility;Pain;Decreased knowledge of use of DME PT Treatment Interventions: DME  instruction;Gait training;Stair training;Functional mobility training;Therapeutic activities;Therapeutic exercise;Patient/family education   PT Goals Acute Rehab PT Goals PT Goal Formulation: With patient Time For Goal Achievement: 05/21/12 Potential to Achieve Goals: Good Pt will go Supine/Side to Sit: with supervision PT Goal: Supine/Side to Sit - Progress: Goal set today Pt will go Sit to Supine/Side: with supervision PT Goal: Sit to Supine/Side - Progress: Progressing toward goal Pt will go Sit to Stand: with supervision PT Goal: Sit to Stand - Progress: Progressing toward goal Pt will Ambulate: 51 - 150 feet;with supervision;with rolling walker PT Goal: Ambulate - Progress: Progressing toward goal Pt will Go Up / Down Stairs: 3-5 stairs;with rail(s);with least restrictive assistive device;with min assist PT Goal: Up/Down Stairs - Progress: Goal set today Pt will Perform Home Exercise Program: with supervision, verbal cues required/provided PT Goal: Perform Home Exercise Program - Progress: Goal set today  Visit Information  Last PT Received On: 05/14/12 Assistance Needed: +1    Subjective Data  Subjective: My BP is usually high Patient Stated Goal: home   Cognition  Cognition Overall Cognitive Status: Appears within functional limits for tasks assessed/performed Arousal/Alertness: Awake/Cooley Orientation Level: Appears intact for tasks assessed Behavior During Session: [facility] for tasks performed    Balance     End of Session PT - End of Session Activity Tolerance: Patient limited by pain Patient left: in bed;with call bell/phone within reach   GP     [name] [facility] 05/14/2012, 2:53 PM [phone]

## 2012-05-14 NOTE — Evaluation (Signed)
Physical Therapy Evaluation Patient Details Name: [name] MRN: [id number] DOB: 12/13/1960 Today's Date: 05/14/2012 Time: 7564-3329 PT Time Calculation (min): 24 min  PT Assessment / Plan / Recommendation Clinical Impression  52 yo female s/p R THA-direct anterior. Has hx of spinal (back, neck) surgeries. On eval pt required Min assist for mobility. BP 101/54 sitting EOB and 103/71 standing. Pt reports moderate pain level with activity. Recommend HHPT.     PT Assessment  Patient needs continued PT services    Follow Up Recommendations  Home health PT    Does the patient have the potential to tolerate intense rehabilitation      Barriers to Discharge        Equipment Recommendations  None recommended by PT    Recommendations for Other Services OT consult   Frequency 7X/week    Precautions / Restrictions Restrictions Weight Bearing Restrictions: No RLE Weight Bearing: Weight bearing as tolerated   Pertinent Vitals/Pain 8/10 R hip      Mobility  Bed Mobility Bed Mobility: Supine to Sit Supine to Sit: HOB elevated;With rails;4: Min assist Details for Bed Mobility Assistance: Assist for R LE off bed. Increased time. Transfers Transfers: Sit to Stand;Stand to Sit Sit to Stand: 4: Min assist;From bed Stand to Sit: 4: Min assist;To chair/3-in-1 Details for Transfer Assistance: VCs safety, technique, hand placement. Assist to rise, stabilize, control descent Ambulation/Gait Ambulation/Gait Assistance: 4: Min assist Ambulation Distance (Feet): 50 Feet Assistive device: Rolling walker Ambulation/Gait Assistance Details: VCs safety, technique, sequence, step length. slow gait speed Gait Pattern: Step-to pattern;Step-through pattern;Antalgic;Decreased stride length;Decreased step length - right    Exercises     PT Diagnosis: Difficulty walking;Abnormality of gait;Acute pain  PT Problem List: Decreased strength;Decreased range of motion;Decreased activity  tolerance;Decreased mobility;Pain;Decreased knowledge of use of DME PT Treatment Interventions: DME instruction;Gait training;Stair training;Functional mobility training;Therapeutic activities;Therapeutic exercise;Patient/family education   PT Goals Acute Rehab PT Goals PT Goal Formulation: With patient Time For Goal Achievement: 05/21/12 Potential to Achieve Goals: Good Pt will go Supine/Side to Sit: with supervision PT Goal: Supine/Side to Sit - Progress: Goal set today Pt will go Sit to Supine/Side: with supervision PT Goal: Sit to Supine/Side - Progress: Goal set today Pt will go Sit to Stand: with supervision PT Goal: Sit to Stand - Progress: Goal set today Pt will Ambulate: 51 - 150 feet;with supervision;with rolling walker PT Goal: Ambulate - Progress: Goal set today Pt will Go Up / Down Stairs: 3-5 stairs;with rail(s);with least restrictive assistive device;with min assist PT Goal: Up/Down Stairs - Progress: Goal set today Pt will Perform Home Exercise Program: with supervision, verbal cues required/provided PT Goal: Perform Home Exercise Program - Progress: Goal set today  Visit Information  Last PT Received On: 05/14/12 Assistance Needed: +1    Subjective Data  Subjective: My BP is usually high Patient Stated Goal: home   Prior Functioning  Home Living Lives With: Significant other;Family;Son Type of Home: House Home Access: Stairs to enter [employer] of Steps: 5 Entrance Stairs-Rails: Right;Left Home Layout: Two level;Able to live on main level with bedroom/bathroom Bathroom Shower/Tub: Tub/shower unit Home Adaptive Equipment: Reacher;Walker - rolling;Long-handled shoehorn;Straight cane;Bedside commode/3-in-1 Prior Function Level of Independence: Independent Able to Take Stairs?: Yes Driving: Yes Communication Communication: No difficulties    Cognition  Cognition Overall Cognitive Status: Appears within functional limits for tasks  assessed/performed Arousal/Alertness: Awake/alert Orientation Level: Appears intact for tasks assessed Behavior During Session: [facility] for tasks performed    Extremity/Trunk Assessment Right Lower  Extremity Assessment RLE ROM/Strength/Tone: Deficits;Unable to fully assess;Due to pain RLE ROM/Strength/Tone Deficits: hip flex 2/5, moves ankle well Left Lower Extremity Assessment LLE ROM/Strength/Tone: [facility] for tasks assessed Trunk Assessment Trunk Assessment: Normal   Balance    End of Session PT - End of Session Activity Tolerance: Patient limited by pain Patient left: in chair;with call bell/phone within reach  GP     Rebeca Alert [facility] 05/14/2012, 12:16 PM [phone]

## 2012-05-14 NOTE — Progress Notes (Signed)
   Subjective: 1 Day Post-Op Procedure(s) (LRB): TOTAL HIP ARTHROPLASTY ANTERIOR APPROACH (Right) Patient reports pain as mild.   Patient seen in rounds with Dr. Lequita Halt.  Blood pressure is soft this morning.  Will give fluids. Patient is well, and has had no acute complaints or problems We will start therapy today.  Plan is to go Home after hospital stay.  Objective: Vital signs in last 24 hours: Temp:  [97.4 F (36.3 C)-98.9 F (37.2 C)] 97.6 F (36.4 C) (03/04 0845) Pulse Rate:  [62-81] 74 (03/04 0845) Resp:  [15-20] 16 (03/04 0845) BP: (89-198)/(55-108) 89/55 mmHg (03/04 0845) SpO2:  [94 %-100 %] 100 % (03/04 0845) Weight:  [66.225 kg (146 lb)] 66.225 kg (146 lb) (03/03 1730)  Intake/Output from previous day:  Intake/Output Summary (Last 24 hours) at 05/14/12 0913 Last data filed at 05/14/12 0648  Gross per 24 hour  Intake 4283.75 ml  Output   3780 ml  Net 503.75 ml    Intake/Output this shift:    Labs:  Recent Labs  05/14/12 0410  HGB 9.4*    Recent Labs  05/14/12 0410  WBC 9.2  RBC 3.22*  HCT 27.9*  PLT 285    Recent Labs  05/14/12 0410  NA 132*  K 4.4  CL 99  CO2 24  BUN 8  CREATININE 0.65  GLUCOSE 168*  CALCIUM 8.4   No results found for this basename: LABPT, INR,  in the last 72 hours  EXAM General - Patient is Alert, Appropriate and Oriented Extremity - Neurovascular intact Sensation intact distally Dorsiflexion/Plantar flexion intact Dressing - dressing C/D/I Motor Function - intact, moving foot and toes well on exam.  Hemovac pulled without difficulty.  Past Medical History  Diagnosis Date  . Headache     AND NECK PAIN--STATES RECENT TEST SHOW CERVICAL DEGENERATION  . PONV (postoperative nausea and vomiting)     PT GIVES HX OF N&V AND FEVER WITH SURGERIES YEARS AGO--BUT NO PROBLEMS WITH MORE RECENT SURGERIES--STATES NOT MALIGNANT HYPERTHERMIA  . 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 Hypertension   . Anxiety   . Depression     PT STATES A LOT OF STRESS IN HER LIFE  . Asthma     INHALERS WITH FLARE -UPS  . GERD (gastroesophageal reflux disease)   . DDD (degenerative disc disease)     CERVICAL AND LUMBAR-CHRONIC PAIN, RT HIP LABRAL TEAR  . Pain     CHRONIC NECK AND BACK PAIN - LIMITED ROM NECK - S/P FUSIONS CERVICAL AND LUMBAR  . Anemia     Iron Infusion-8 yrs ago  . History of blood transfusion     s/p back surgery    Assessment/Plan: 1 Day Post-Op Procedure(s) (LRB): TOTAL HIP ARTHROPLASTY ANTERIOR APPROACH (Right) Principal Problem:   OA (osteoarthritis) of hip Active Problems:   Postoperative anemia due to acute blood loss   Postop Hyponatremia  Estimated body mass index is 25.05 kg/(m^2) as calculated from the following:   Height as of this encounter: 5\' 4"  (1.626 m).   Weight as of this encounter: 66.225 kg (146 lb). Advance diet Up with therapy Plan for discharge tomorrow Discharge home with home health  DVT Prophylaxis - Xarelto Weight Bearing As Tolerated right Leg Hemovac Pulled Begin Therapy No vaccines.  PERKINS, ALEXZANDREW 05/14/2012, 9:13 AM

## 2012-05-14 NOTE — Progress Notes (Signed)
Physical Therapy Treatment Patient Details Name: [name] MRN: [id number] DOB: [birth year] Today's Date: 05/14/2012 Time: 4098-1191 PT Time Calculation (min): 10 min  PT Assessment / Plan / Recommendation Comments on Treatment Session  Pt continues to report increased pain R hip, despite meds. c/o not being able to swallow well-couldn't swallow muscle relaxer-RN notified. Recommend HHPT. Pt declined ice after exercises.    Follow Up Recommendations  Home health PT     Does the patient have the potential to tolerate intense rehabilitation     Barriers to Discharge        Equipment Recommendations  None recommended by PT    Recommendations for Other Services OT consult  Frequency 7X/week   Plan Discharge plan remains appropriate    Precautions / Restrictions Restrictions Weight Bearing Restrictions: No RLE Weight Bearing: Weight bearing as tolerated   Pertinent Vitals/Pain 9/10 R hip    Mobility     Exercises Total Joint Exercises Ankle Circles/Pumps: AROM;Both;10 reps;Supine Quad Sets: AROM;Both;Supine Heel Slides: AAROM;Right;10 reps;Supine Hip ABduction/ADduction: AAROM;Right;5 reps;Supine   PT Diagnosis: Difficulty walking;Abnormality of gait;Acute pain  PT Problem List: Decreased strength;Decreased range of motion;Decreased activity tolerance;Decreased mobility;Pain;Decreased knowledge of use of DME PT Treatment Interventions: DME instruction;Gait training;Stair training;Functional mobility training;Therapeutic activities;Therapeutic exercise;Patient/family education   PT Goals Acute Rehab PT Goals PT Goal Formulation: With patient Time For Goal Achievement: 05/21/12 Potential to Achieve Goals: Good Pt will go Supine/Side to Sit: with supervision PT Goal: Supine/Side to Sit - Progress: Goal set today Pt will go Sit to Supine/Side: with supervision PT Goal: Sit to Supine/Side - Progress: Progressing toward goal Pt will go Sit to Stand: with  supervision PT Goal: Sit to Stand - Progress: Progressing toward goal Pt will Ambulate: 51 - 150 feet;with supervision;with rolling walker PT Goal: Ambulate - Progress: Progressing toward goal Pt will Go Up / Down Stairs: 3-5 stairs;with rail(s);with least restrictive assistive device;with min assist PT Goal: Up/Down Stairs - Progress: Goal set today Pt will Perform Home Exercise Program: with supervision, verbal cues required/provided PT Goal: Perform Home Exercise Program - Progress: Progressing toward goal  Visit Information  Last PT Received On: 05/14/12 Assistance Needed: +1    Subjective Data  Subjective: It still hurts from before Patient Stated Goal: home   Cognition       Balance     End of Session PT - End of Session Activity Tolerance: Patient limited by pain Patient left: in bed;with call bell/phone within reach   GP     Rebeca Alert [facility] 05/14/2012, 4:11 PM [phone]

## 2012-05-14 NOTE — Anesthesia Postprocedure Evaluation (Signed)
  Anesthesia Post-op Note  Patient: [name]  Procedure(s) Performed: Procedure(s) (LRB): TOTAL HIP ARTHROPLASTY ANTERIOR APPROACH (Right)  Patient Location: PACU  Anesthesia Type: General  Level of Consciousness: awake and alert   Airway and Oxygen Therapy: Patient Spontanous Breathing  Post-op Pain: mild  Post-op Assessment: Post-op Vital signs reviewed, Patient's Cardiovascular Status Stable, Respiratory Function Stable, Patent Airway and No signs of Nausea or vomiting  Last Vitals:  Filed Vitals:   05/14/12 0647  BP: 92/58  Pulse: 81  Temp: 37 C  Resp: 18    Post-op Vital Signs: stable   Complications: No apparent anesthesia complications

## 2012-05-15 ENCOUNTER — Inpatient Hospital Stay (HOSPITAL_COMMUNITY): Payer: Medicaid Other

## 2012-05-15 LAB — URINALYSIS, ROUTINE W REFLEX MICROSCOPIC
Nitrite: NEGATIVE
Specific Gravity, Urine: 1.011 (ref 1.005–1.030)
Urobilinogen, UA: 1 mg/dL (ref 0.0–1.0)
pH: 6.5 (ref 5.0–8.0)

## 2012-05-15 LAB — BASIC METABOLIC PANEL
Chloride: 104 mEq/L (ref 96–112)
GFR calc Af Amer: >90 mL/min (ref >90)
GFR calc non Af Amer: >90 mL/min (ref >90)
Potassium: 4 mEq/L (ref 3.5–5.1)
Sodium: 137 mEq/L (ref 135–145)

## 2012-05-15 LAB — URINE MICROSCOPIC-ADD ON

## 2012-05-15 LAB — CBC
MCHC: 33.9 g/dL (ref 30.0–36.0)
Platelets: 212 10*3/uL (ref 150–400)
RDW: 13.5 % (ref 11.5–15.5)
WBC: 7.9 10*3/uL (ref 4.0–10.5)

## 2012-05-15 NOTE — Evaluation (Signed)
Occupational Therapy Evaluation Patient Details Name: [name] MRN: [id number] DOB: 1960-09-26 Today's Date: 05/15/2012 Time: 4098-1191 OT Time Calculation (min): 20 min  OT Assessment / Plan / Recommendation Clinical Impression  This 52 year old female was admitted for R anterior direct THA.  She is appropriate for skilled OT to continue education for tub transfer.  Do not anticipate she will need post acute OT.      OT Assessment  Patient needs continued OT Services    Follow Up Recommendations  No OT follow up    Barriers to Discharge      Equipment Recommendations  Other (comment) (to be further assessed--possibly tub transfer bench)    Recommendations for Other Services    Frequency  Min 2X/week    Precautions / Restrictions Precautions Precautions: Fall Restrictions Weight Bearing Restrictions: No RLE Weight Bearing: Weight bearing as tolerated   Pertinent Vitals/Pain Pain from hip crease down leg after ambulating.  Not rated.  Repositioned.  Premedicated    ADL  Grooming: Set up Where Assessed - Grooming: Unsupported sitting Upper Body Bathing: Set up Where Assessed - Upper Body Bathing: Unsupported sitting Lower Body Bathing: Min guard (with AE); Min A sit to stand Where Assessed - Lower Body Bathing: Supported sit to stand Upper Body Dressing: Minimal assistance (iv) Where Assessed - Upper Body Dressing: Unsupported sitting Lower Body Dressing: Minimal assistance Where Assessed - Lower Body Dressing: Supported sit to stand Toilet Transfer: Minimal assistance Toilet Transfer Method: Sit to Barista: Other (comment) (bed, ambulate, chair) Toileting - Clothing Manipulation and Hygiene: Min guard; Min A sit to stand Where Assessed - Engineer, mining and Hygiene: Sit to stand from 3-in-1 or toilet Equipment Used: Rolling walker Transfers/Ambulation Related to ADLs: pt got lightheaded after walking into hall.   ADL  Comments: Pt is interested in tub transfer bench.  She lived in different home when she had back surgery.  Will not be able to step over tub initially.  Reviewed tub readiness.  Pt would like to see tub bench tomorrow.     OT Diagnosis: Generalized weakness  OT Problem List: Pain;Decreased strength;Decreased activity tolerance;Decreased knowledge of use of DME or AE OT Treatment Interventions: Self-care/ADL training;DME and/or AE instruction;Patient/family education   OT Goals Acute Rehab OT Goals OT Goal Formulation: With patient Time For Goal Achievement: 05/22/12 Potential to Achieve Goals: Good ADL Goals Pt Will Transfer to Toilet: with supervision;Ambulation;3-in-1 and complete all aspects of toileting with supervision ADL Goal: Toilet Transfer - Progress: Goal set today Pt Will Perform Tub/Shower Transfer: with min assist;Ambulation;Transfer tub bench;Tub transfer ADL Goal: Tub/Shower Transfer - Progress: Goal set today  Visit Information  Last OT Received On: 05/15/12 Assistance Needed: +2 (ambulation) PT/OT Co-Evaluation/Treatment: Yes    Subjective Data  Subjective: I feel like I'm going to pass out   Prior Functioning     Home Living Lives With: Significant other;Family;Son Type of Home: House Home Access: Stairs to enter [employer] of Steps: 5 Entrance Stairs-Rails: Right;Left Home Layout: Two level;Able to live on main level with bedroom/bathroom Bathroom Shower/Tub: Engineer, manufacturing systems: Standard Home Adaptive Equipment: Reacher;Walker - rolling;Long-handled shoehorn;Straight cane;Bedside commode/3-in-1 Additional Comments: used AE after back surgery: boyfriend dons socks Prior Function Level of Independence: Independent with assistive device(s) (except socks) Communication Communication: No difficulties         Vision/Perception     Cognition  Cognition Overall Cognitive Status: Appears within functional limits for tasks  assessed/performed Arousal/Alertness: Awake/alert Orientation  Level: Appears intact for tasks assessed Behavior During Session: [facility] for tasks performed    Extremity/Trunk Assessment Right Upper Extremity Assessment RUE ROM/Strength/Tone: [facility] for tasks assessed Left Upper Extremity Assessment LUE ROM/Strength/Tone: [facility] for tasks assessed     Mobility Bed Mobility Bed Mobility: Supine to Sit;Sit to Supine Supine to Sit: 4: Min assist;With rails;HOB elevated Sit to Supine: 4: Min assist Details for Bed Mobility Assistance: Assist for R LE onto/off bed. Increased time. Transfers Sit to Stand: 4: Min assist;From bed;From chair/3-in-1 Stand to Sit: 4: Min assist;To chair/3-in-1;To bed Details for Transfer Assistance: vcs for safety     Exercise     Balance     End of Session OT - End of Session Activity Tolerance: Other (comment);Patient limited by pain (limited by lightheadedness) Patient left: in bed;with call bell/phone within reach  GO     [facility] 05/15/2012, 3:22 PM [name], OTR/L [phone] 05/15/2012

## 2012-05-15 NOTE — Progress Notes (Signed)
Patient c/o feeling hot. Oral temp @ 0120--102.1. Decreased to 99.1 after use of IS. Tylenol 650mg  given po @ 0125 for fever. Oral temp @0229 --102.5. PA on call paged. T.O.V. For a urinalysis. Patient notified of need of urine specimen.

## 2012-05-15 NOTE — Progress Notes (Signed)
Subjective: 2 Days Post-Op Procedure(s) (LRB): TOTAL HIP ARTHROPLASTY ANTERIOR APPROACH (Right) Patient reports pain as mild and moderate.  She is feeling better today though Patient seen in rounds with Dr. Lequita Halt. Patient is well, but has had some minor complaints of elevated temps thru the night. She had a UA done which was okay CXR done also today. Clinical Data: Postoperative fever, history asthma CHEST - 2 VIEW  Comparison: 09/06/2011 IMPRESSION:  Minimal chronic bronchitic changes.  No acute abnormalities. WBC is normal.  HGB is 8.5 We will start therapy today.  Plan is to go Home after hospital stay.  Objective: Vital signs in last 24 hours: Temp:  [99 F (37.2 C)-102.5 F (39.2 C)] 101.6 F (38.7 C) (03/05 1410) Pulse Rate:  [90-102] 102 (03/05 1410) Resp:  [16-18] 18 (03/05 1410) BP: (108-127)/(68-78) 114/76 mmHg (03/05 1410) SpO2:  [92 %-96 %] 92 % (03/05 1410)  Intake/Output from previous day:  Intake/Output Summary (Last 24 hours) at 05/15/12 1607 Last data filed at 05/15/12 1330  Gross per 24 hour  Intake 3130.17 ml  Output   3250 ml  Net -119.83 ml    Intake/Output this shift: Total I/O In: -  Out: 1300 [Urine:1300]  Labs:  Recent Labs  05/14/12 0410 05/15/12 0400  HGB 9.4* 8.5*    Recent Labs  05/14/12 0410 05/15/12 0400  WBC 9.2 7.9  RBC 3.22* 2.83*  HCT 27.9* 25.1*  PLT 285 212    Recent Labs  05/14/12 0410 05/15/12 0400  NA 132* 137  K 4.4 4.0  CL 99 104  CO2 24 24  BUN 8 6  CREATININE 0.65 0.65  GLUCOSE 168* 136*  CALCIUM 8.4 8.2*   No results found for this basename: LABPT, INR,  in the last 72 hours  EXAM General - Patient is Alert, Appropriate and Oriented Extremity - Neurovascular intact Sensation intact distally Dorsiflexion/Plantar flexion intact No cellulitis present Dressing - dressing C/D/I, incision looks good. Motor Function - intact, moving foot and toes well on exam.   Past Medical History    Diagnosis Date  . Headache     AND NECK PAIN--STATES RECENT TEST SHOW CERVICAL DEGENERATION  . PONV (postoperative nausea and vomiting)     PT GIVES HX OF N&V AND FEVER WITH SURGERIES YEARS AGO--BUT NO PROBLEMS WITH MORE RECENT SURGERIES--STATES NOT MALIGNANT HYPERTHERMIA  . 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 Hypertension   . Anxiety   . Depression     PT STATES A LOT OF STRESS IN HER LIFE  . Asthma     INHALERS WITH FLARE -UPS  . GERD (gastroesophageal reflux disease)   . DDD (degenerative disc disease)     CERVICAL AND LUMBAR-CHRONIC PAIN, RT HIP LABRAL TEAR  . Pain     CHRONIC NECK AND BACK PAIN - LIMITED ROM NECK - S/P FUSIONS CERVICAL AND LUMBAR  . Anemia     Iron Infusion-8 yrs ago  . History of blood transfusion     s/p back surgery    Assessment/Plan: 2 Days Post-Op Procedure(s) (LRB): TOTAL HIP ARTHROPLASTY ANTERIOR APPROACH (Right) Principal Problem:   OA (osteoarthritis) of hip Active Problems:   Postoperative anemia due to acute blood loss   Postop Hyponatremia  Estimated body mass index is 25.05 kg/(m^2) as calculated from the following:   Height as of this encounter: 5\' 4"  (1.626 m).  Weight as of this encounter: 66.225 kg (146 lb). Up with therapy  DVT Prophylaxis - Xarelto Weight Bearing As Tolerated right Leg   PERKINS, ALEXZANDREW 05/15/2012, 4:07 PM

## 2012-05-15 NOTE — Progress Notes (Signed)
Physical Therapy Treatment Patient Details Name: EMORY LEAVER MRN: [id number] DOB: 05/24/1960 Today's Date: 05/15/2012 Time: 7829-5621 PT Time Calculation (min): 21 min  PT Assessment / Plan / Recommendation Comments on Treatment Session  Pt continues to have increased temp. c/o lightheadedness this session. Limited ambulation distance. Recommend HHPT. Will need to practice steps on tomorrow.     Follow Up Recommendations  Home health PT     Does the patient have the potential to tolerate intense rehabilitation     Barriers to Discharge        Equipment Recommendations  None recommended by PT    Recommendations for Other Services OT consult  Frequency 7X/week   Plan Discharge plan remains appropriate    Precautions / Restrictions Precautions Precautions: Fall Restrictions Weight Bearing Restrictions: No RLE Weight Bearing: Weight bearing as tolerated   Pertinent Vitals/Pain 7/10 R hip    Mobility  Bed Mobility Bed Mobility: Supine to Sit;Sit to Supine Supine to Sit: 4: Min assist Sit to Supine: 4: Min assist Details for Bed Mobility Assistance: Assist for R LE onto/off bed. Increased time. Transfers Transfers: Sit to Stand;Stand to Sit Sit to Stand: 4: Min assist;From bed;From chair/3-in-1 Stand to Sit: 4: Min assist;To chair/3-in-1;To bed Details for Transfer Assistance: x2. VCs safety, technique, hand placement. Assist to rise, stabilize, control descent Ambulation/Gait Ambulation/Gait Assistance: 4: Min assist Ambulation Distance (Feet): 35 Feet Assistive device: Rolling walker Ambulation/Gait Assistance Details: VCS safety, technique, sequence. Slow gait speed. Pt c/o lightheadedness and requested to sit down. Recliner brought up behind pt. Pt declined to ambulate further.  Gait Pattern: Step-to pattern;Antalgic;Decreased stride length;Decreased step length - right    Exercises    PT Diagnosis:    PT Problem List:   PT Treatment Interventions:     PT  Goals Acute Rehab PT Goals Pt will go Supine/Side to Sit: with supervision PT Goal: Supine/Side to Sit - Progress: Progressing toward goal Pt will go Sit to Supine/Side: with supervision PT Goal: Sit to Supine/Side - Progress: Progressing toward goal Pt will go Sit to Stand: with supervision PT Goal: Sit to Stand - Progress: Progressing toward goal Pt will Ambulate: 51 - 150 feet;with supervision;with rolling walker PT Goal: Ambulate - Progress: Progressing toward goal Pt will Perform Home Exercise Program: with supervision, verbal cues required/provided PT Goal: Perform Home Exercise Program - Progress: Progressing toward goal  Visit Information  Last PT Received On: 05/15/12 Assistance Needed: +1    Subjective Data  Subjective: I feel lightheaded Patient Stated Goal: feel better. home   Cognition  Cognition Overall Cognitive Status: Appears within functional limits for tasks assessed/performed Arousal/Alertness: Awake/alert Orientation Level: Appears intact for tasks assessed Behavior During Session: [facility] for tasks performed    Balance     End of Session PT - End of Session Activity Tolerance: Patient limited by pain (Limited by lightheadedness) Patient left: in bed;with call bell/phone within reach   GP     Rebeca Alert, MPT Pager: [phone]

## 2012-05-15 NOTE — Progress Notes (Signed)
Physical Therapy Treatment Patient Details Name: [name] MRN: [id number] DOB: [birth year] Today's Date: 05/15/2012 Time: 4098-1191 PT Time Calculation (min): 10 min  PT Assessment / Plan / Recommendation Comments on Treatment Session  Pt declined OOB this am but agreeable to ambulate and practice steps this pm. Possible d/c home later this evening or tomorrow am. recommend HHPT.     Follow Up Recommendations  Home health PT     Does the patient have the potential to tolerate intense rehabilitation     Barriers to Discharge        Equipment Recommendations  None recommended by PT    Recommendations for Other Services OT consult  Frequency 7X/week   Plan Discharge plan remains appropriate    Precautions / Restrictions Restrictions Weight Bearing Restrictions: No RLE Weight Bearing: Weight bearing as tolerated   Pertinent Vitals/Pain 7/10 R hip    Mobility       Exercises Total Joint Exercises Ankle Circles/Pumps: AROM;Both;10 reps;Supine Quad Sets: AROM;Both;10 reps;Supine Short Arc Quad: AAROM;Right;10 reps;Supine Heel Slides: Right;AAROM;10 reps;Supine Hip ABduction/ADduction: AAROM;Right;10 reps;Supine   PT Diagnosis:    PT Problem List:   PT Treatment Interventions:     PT Goals Acute Rehab PT Goals Pt will Perform Home Exercise Program: with supervision, verbal cues required/provided PT Goal: Perform Home Exercise Program - Progress: Progressing toward goal  Visit Information  Last PT Received On: 05/15/12 Assistance Needed: +1    Subjective Data  Subjective: can we just do something in the bed? Patient Stated Goal: feel better. home   Cognition  Cognition Overall Cognitive Status: Appears within functional limits for tasks assessed/performed Arousal/Alertness: Awake/alert Orientation Level: Appears intact for tasks assessed Behavior During Session: [facility] for tasks performed    Balance     End of Session PT - End of Session Activity  Tolerance: Patient limited by pain (has temp since early this am) Patient left: in bed;with call bell/phone within reach   GP     Rebeca Alert, MPT Pager: [phone]

## 2012-05-16 LAB — CBC
HCT: 25.9 % — ABNORMAL LOW (ref 36.0–46.0)
Hemoglobin: 8.9 g/dL — ABNORMAL LOW (ref 12.0–15.0)
MCHC: 34.4 g/dL (ref 30.0–36.0)
RDW: 13 % (ref 11.5–15.5)
WBC: 8.1 10*3/uL (ref 4.0–10.5)

## 2012-05-16 MED ORDER — OXYCODONE HCL 5 MG PO TABS: 5.0000 mg | ORAL_TABLET | ORAL | Status: DC | PRN

## 2012-05-16 MED ORDER — POLYSACCHARIDE IRON COMPLEX 150 MG PO CAPS: 150.0000 mg | ORAL_CAPSULE | Freq: Every day | ORAL | Status: DC

## 2012-05-16 MED ORDER — CIPROFLOXACIN HCL 500 MG PO TABS: 500.0000 mg | ORAL_TABLET | Freq: Two times a day (BID) | ORAL | Status: DC

## 2012-05-16 MED ORDER — CYCLOBENZAPRINE HCL 10 MG PO TABS: 10.0000 mg | ORAL_TABLET | Freq: Three times a day (TID) | ORAL | Status: DC | PRN

## 2012-05-16 MED ORDER — SODIUM CHLORIDE 0.9 % IV BOLUS (SEPSIS)
500.0000 mL | Freq: Once | INTRAVENOUS | Status: AC
  Administered 2012-05-16: 500 mL via INTRAVENOUS

## 2012-05-16 MED ORDER — RIVAROXABAN 10 MG PO TABS: 10.0000 mg | ORAL_TABLET | Freq: Every day | ORAL | Status: DC

## 2012-05-16 MED ORDER — TRAMADOL HCL 50 MG PO TABS: 50.0000 mg | ORAL_TABLET | Freq: Four times a day (QID) | ORAL | Status: DC | PRN

## 2012-05-16 MED ORDER — CIPROFLOXACIN HCL 500 MG PO TABS
500.0000 mg | ORAL_TABLET | Freq: Two times a day (BID) | ORAL | Status: DC
  Administered 2012-05-16 – 2012-05-18 (×5): 500 mg via ORAL
  Filled 2012-05-16 (×7): qty 1

## 2012-05-16 NOTE — Progress Notes (Signed)
Physical Therapy Treatment Patient Details Name: [name] MRN: [id number] DOB: November 18, 1960 Today's Date: 05/16/2012 Time: [id number] PT Time Calculation (min): 25 min  PT Assessment / Plan / Recommendation Comments on Treatment Session  Pt continues to be limited by lightheadedness. BP dropped to 78/48 while practicing ascending/descending steps. RN made aware. Do not feel it is safe for pt to d/c home today. Recommend HHPT.     Follow Up Recommendations  Home health PT;Supervision for mobility/OOB     Does the patient have the potential to tolerate intense rehabilitation     Barriers to Discharge        Equipment Recommendations  None recommended by PT    Recommendations for Other Services OT consult  Frequency 7X/week   Plan Discharge plan remains appropriate    Precautions / Restrictions Precautions Precautions: Fall Precaution Comments: orthostatic-pt symptomatic Restrictions Weight Bearing Restrictions: No RLE Weight Bearing: Weight bearing as tolerated   Pertinent Vitals/Pain 8/10 R hip    Mobility  Bed Mobility Bed Mobility: Supine to Sit;Sit to Supine Details for Bed Mobility Assistance: Assist for R LE onto/off bed. Increased time. Transfers Transfers: Sit to Stand;Stand to Sit Sit to Stand: 4: Min guard;From bed;From chair/3-in-1 Stand to Sit: To bed;To chair/3-in-1 Details for Transfer Assistance: vcs for hand placement Ambulation/Gait Ambulation Distance (Feet): 10 Feet Assistive device: Rolling walker Gait Pattern: Step-to pattern;Antalgic;Decreased stride length;Decreased step length - left Stairs: Yes Stairs Assistance: 4: Min assist Stairs Assistance Details (indicate cue type and reason): VCs safety, technique, hand placement. assist to stabilize and maneuver with assistive devices. Pt had to sit after ascending/descending due to lightheadedness Stair Management Technique: Step to pattern;Forwards;With crutches;One rail Left Number of Stairs:  4    Exercises Total Joint Exercises Ankle Circles/Pumps: AROM;Both;10 reps;Supine Quad Sets: AROM;Both;10 reps;Supine Heel Slides: AAROM;Right;10 reps;Supine Hip ABduction/ADduction: AAROM;Right;10 reps;Supine   PT Diagnosis:    PT Problem List:   PT Treatment Interventions:     PT Goals Acute Rehab PT Goals Pt will go Supine/Side to Sit: with supervision PT Goal: Supine/Side to Sit - Progress: Progressing toward goal Pt will go Sit to Supine/Side: with supervision PT Goal: Sit to Supine/Side - Progress: Progressing toward goal Pt will go Sit to Stand: with supervision PT Goal: Sit to Stand - Progress: Progressing toward goal Pt will Ambulate: 51 - 150 feet;with supervision;with rolling walker PT Goal: Ambulate - Progress: Progressing toward goal Pt will Go Up / Down Stairs: 3-5 stairs;with rail(s);with least restrictive assistive device;with min assist PT Goal: Up/Down Stairs - Progress: Progressing toward goal Pt will Perform Home Exercise Program: with supervision, verbal cues required/provided PT Goal: Perform Home Exercise Program - Progress: Progressing toward goal  Visit Information  Last PT Received On: 05/16/12 Assistance Needed: +2 (safety for ambulation)    Subjective Data  Subjective: My BP is usually high! Patient Stated Goal: get better. home   Cognition  Cognition Overall Cognitive Status: Appears within functional limits for tasks assessed/performed Arousal/Alertness: Awake/alert Orientation Level: Appears intact for tasks assessed Behavior During Session: [facility] for tasks performed    Balance     End of Session PT - End of Session Activity Tolerance: Patient limited by pain;Other (comment) (Limited by drop in BP-pt symptomatic) Patient left: in bed;with call bell/phone within reach   GP     Rebeca Alert, MPT Pager: [phone]

## 2012-05-16 NOTE — Progress Notes (Signed)
Subjective: 3 Days Post-Op Procedure(s) (LRB): TOTAL HIP ARTHROPLASTY ANTERIOR APPROACH (Right) Patient reports pain as mild.   Patient seen in rounds with Dr. Lequita Halt. Patient is well, but has had some minor complaints of pain in the hip, requiring pain medications.  Her temperature has improved some and been running lower over the past 24 hours.  Her UA is borderline so will treat due to the temperature.  Her CXR was okay.  WBC is normal.  HGB is stable at this time.  As long as she does well with therapy and as long as her temp stays down, the patient will be ready to go home later today.  Objective: Vital signs in last 24 hours: Temp:  [98.5 F (36.9 C)-101.6 F (38.7 C)] 100.6 F (38.1 C) (03/06 0641) Pulse Rate:  [73-102] 96 (03/06 0641) Resp:  [14-18] 16 (03/06 0641) BP: (104-118)/(72-79) 118/79 mmHg (03/06 0641) SpO2:  [92 %-97 %] 97 % (03/06 0641)  Intake/Output from previous day:  Intake/Output Summary (Last 24 hours) at 05/16/12 0728 Last data filed at 05/16/12 0142  Gross per 24 hour  Intake    600 ml  Output   3050 ml  Net  -2450 ml    Intake/Output this shift:    Labs:  Recent Labs  05/14/12 0410 05/15/12 0400 05/16/12 0415  HGB 9.4* 8.5* 8.9*    Recent Labs  05/15/12 0400 05/16/12 0415  WBC 7.9 8.1  RBC 2.83* 2.95*  HCT 25.1* 25.9*  PLT 212 215    Recent Labs  05/14/12 0410 05/15/12 0400  NA 132* 137  K 4.4 4.0  CL 99 104  CO2 24 24  BUN 8 6  CREATININE 0.65 0.65  GLUCOSE 168* 136*  CALCIUM 8.4 8.2*   No results found for this basename: LABPT, INR,  in the last 72 hours  EXAM: General - Patient is Alert, Appropriate and Oriented Extremity - Neurovascular intact Sensation intact distally Dorsiflexion/Plantar flexion intact No cellulitis present Incision - clean, dry, no drainage, healing Motor Function - intact, moving foot and toes well on exam.   Assessment/Plan: 3 Days Post-Op Procedure(s) (LRB): TOTAL HIP ARTHROPLASTY  ANTERIOR APPROACH (Right) Procedure(s) (LRB): TOTAL HIP ARTHROPLASTY ANTERIOR APPROACH (Right) Past Medical History  Diagnosis Date  . Headache     AND NECK PAIN--STATES RECENT TEST SHOW CERVICAL DEGENERATION  . PONV (postoperative nausea and vomiting)     PT GIVES HX OF N&V AND FEVER WITH SURGERIES YEARS AGO--BUT NO PROBLEMS WITH MORE RECENT SURGERIES--STATES NOT MALIGNANT HYPERTHERMIA  . 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 Hypertension   . Anxiety   . Depression     PT STATES A LOT OF STRESS IN HER LIFE  . Asthma     INHALERS WITH FLARE -UPS  . GERD (gastroesophageal reflux disease)   . DDD (degenerative disc disease)     CERVICAL AND LUMBAR-CHRONIC PAIN, RT HIP LABRAL TEAR  . Pain     CHRONIC NECK AND BACK PAIN - LIMITED ROM NECK - S/P FUSIONS CERVICAL AND LUMBAR  . Anemia     Iron Infusion-8 yrs ago  . History of blood transfusion     s/p back surgery   Principal Problem:   OA (osteoarthritis) of hip Active Problems:   Postoperative anemia due to acute blood loss   Postop Hyponatremia  Estimated body mass index is 25.05 kg/(m^2) as calculated  from the following:   Height as of this encounter: 5\' 4"  (1.626 m).   Weight as of this encounter: 66.225 kg (146 lb). Up with therapy Discharge home with home health Diet - Cardiac diet Follow up - in 2 weeks Activity - WBAT Disposition - Home Condition Upon Discharge - Improving  D/C Meds - See DC Summary DVT Prophylaxis - Xarelto  PERKINS, ALEXZANDREW 05/16/2012, 7:28 AM

## 2012-05-16 NOTE — Progress Notes (Signed)
05/16/2012 Colleen Can BSN RN CCM [phone] Cm spoke with patient who is planning to return to her home in [place] upon discharge. She already has RW and reacher. Tub bench has been delivered to her room by [facility] rep. Genevieve Norlander will start [facility] services day after patient is discharged.

## 2012-05-16 NOTE — Progress Notes (Signed)
[name], PA aware via phone pt's orthostatic BP's low even since bolus received earlier in shift. Latest BP taken by PT when pt up was 78/48. Pt asymptomatic except for pain and weakness. See new orders to cancel discharge for today and give another 500 ml NS IV bolus.

## 2012-05-16 NOTE — Discharge Summary (Signed)
Physician Discharge Summary   Patient ID: [name] MRN: [id number] DOB/AGE: [birth year] 52 y.o.  Admit date: 05/13/2012 Discharge date: 05/18/2012  Primary Diagnosis:  Osteoarthritis of the Right hip.   Admission Diagnoses:  Past Medical History  Diagnosis Date  . Headache     AND NECK PAIN--STATES RECENT TEST SHOW CERVICAL DEGENERATION  . PONV (postoperative nausea and vomiting)     PT GIVES HX OF N&V AND FEVER WITH SURGERIES YEARS AGO--BUT NO PROBLEMS WITH MORE RECENT SURGERIES--STATES NOT MALIGNANT HYPERTHERMIA  . 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 Hypertension   . Anxiety   . Depression     PT STATES A LOT OF STRESS IN HER LIFE  . Asthma     INHALERS WITH FLARE -UPS  . GERD (gastroesophageal reflux disease)   . DDD (degenerative disc disease)     CERVICAL AND LUMBAR-CHRONIC PAIN, RT HIP LABRAL TEAR  . Pain     CHRONIC NECK AND BACK PAIN - LIMITED ROM NECK - S/P FUSIONS CERVICAL AND LUMBAR  . Anemia     Iron Infusion-8 yrs ago  . History of blood transfusion     s/p back surgery   Discharge Diagnoses:   Principal Problem:   OA (osteoarthritis) of hip Active Problems:   Postoperative anemia due to acute blood loss   Postop Hyponatremia  Estimated body mass index is 25.05 kg/(m^2) as calculated from the following:   Height as of this encounter: 5\' 4"  (1.626 m).   Weight as of this encounter: 66.225 kg (146 lb).  Classification of overweight in adults according to BMI (WHO, 1998)   Procedure: Procedure(s) (LRB): TOTAL HIP ARTHROPLASTY ANTERIOR APPROACH (Right)   Consults: None  HPI: [name] is a 52 y.o. female who has advanced end-  stage arthritis of his Right hip with progressively worsening pain and  dysfunction.The patient has failed nonoperative management and presents for  total hip arthroplasty.   Laboratory Data: Admission on 05/13/2012    Component Date Value Range Status  . WBC 05/14/2012 9.2  4.0 - 10.5 K/uL Final  . RBC 05/14/2012 3.22* 3.87 - 5.11 MIL/uL Final  . Hemoglobin 05/14/2012 9.4* 12.0 - 15.0 g/dL Final  . HCT 11/91/4782 27.9* 36.0 - 46.0 % Final  . MCV 05/14/2012 86.6  78.0 - 100.0 fL Final  . MCH 05/14/2012 29.2  26.0 - 34.0 pg Final  . MCHC 05/14/2012 33.7  30.0 - 36.0 g/dL Final  . RDW 95/62/1308 13.0  11.5 - 15.5 % Final  . Platelets 05/14/2012 285  150 - 400 K/uL Final  . Sodium 05/14/2012 132* 135 - 145 mEq/L Final  . Potassium 05/14/2012 4.4  3.5 - 5.1 mEq/L Final  . Chloride 05/14/2012 99  96 - 112 mEq/L Final  . CO2 05/14/2012 24  19 - 32 mEq/L Final  . Glucose, Bld 05/14/2012 168* 70 - 99 mg/dL Final  . BUN 65/78/4696 8  6 - 23 mg/dL Final  . Creatinine, Ser 05/14/2012 0.65  0.50 - 1.10 mg/dL Final  . Calcium 29/52/8413 8.4  8.4 - 10.5 mg/dL Final  . GFR calc non Af Amer 05/14/2012 >90  >90 mL/min Final  . GFR calc Af Amer 05/14/2012 >90  >90 mL/min Final   Comment:  The eGFR has been calculated                          using the CKD EPI equation.                          This calculation has not been                          validated in all clinical                          situations.                          eGFR's persistently                          <90 mL/min signify                          possible Chronic Kidney Disease.  . WBC 05/15/2012 7.9  4.0 - 10.5 K/uL Final  . RBC 05/15/2012 2.83* 3.87 - 5.11 MIL/uL Final  . Hemoglobin 05/15/2012 8.5* 12.0 - 15.0 g/dL Final  . HCT 16/12/9602 25.1* 36.0 - 46.0 % Final  . MCV 05/15/2012 88.7  78.0 - 100.0 fL Final  . MCH 05/15/2012 30.0  26.0 - 34.0 pg Final  . MCHC 05/15/2012 33.9  30.0 - 36.0 g/dL Final  . RDW 54/11/8117 13.5  11.5 - 15.5 % Final  . Platelets 05/15/2012 212  150 - 400 K/uL Final   Comment: RESULT REPEATED AND VERIFIED                          DELTA CHECK NOTED                           SPECIMEN CHECKED FOR CLOTS  . Sodium 05/15/2012 137  135 - 145 mEq/L Final  . Potassium 05/15/2012 4.0  3.5 - 5.1 mEq/L Final  . Chloride 05/15/2012 104  96 - 112 mEq/L Final  . CO2 05/15/2012 24  19 - 32 mEq/L Final  . Glucose, Bld 05/15/2012 136* 70 - 99 mg/dL Final  . BUN 14/78/2956 6  6 - 23 mg/dL Final  . Creatinine, Ser 05/15/2012 0.65  0.50 - 1.10 mg/dL Final  . Calcium 21/30/8657 8.2* 8.4 - 10.5 mg/dL Final  . GFR calc non Af Amer 05/15/2012 >90  >90 mL/min Final  . GFR calc Af Amer 05/15/2012 >90  >90 mL/min Final   Comment:                                 The eGFR has been calculated                          using the CKD EPI equation.                          This calculation has not been  validated in all clinical                          situations.                          eGFR's persistently                          <90 mL/min signify                          possible Chronic Kidney Disease.  . Color, Urine 05/15/2012 YELLOW  YELLOW Final  . APPearance 05/15/2012 CLEAR  CLEAR Final  . Specific Gravity, Urine 05/15/2012 1.011  1.005 - 1.030 Final  . pH 05/15/2012 6.5  5.0 - 8.0 Final  . Glucose, UA 05/15/2012 NEGATIVE  NEGATIVE mg/dL Final  . Hgb urine dipstick 05/15/2012 NEGATIVE  NEGATIVE Final  . Bilirubin Urine 05/15/2012 NEGATIVE  NEGATIVE Final  . Ketones, ur 05/15/2012 NEGATIVE  NEGATIVE mg/dL Final  . Protein, ur 47/82/9562 NEGATIVE  NEGATIVE mg/dL Final  . Urobilinogen, UA 05/15/2012 1.0  0.0 - 1.0 mg/dL Final  . Nitrite 13/10/6576 NEGATIVE  NEGATIVE Final  . Leukocytes, UA 05/15/2012 SMALL* NEGATIVE Final  . Squamous Epithelial / LPF 05/15/2012 RARE  RARE Final  . WBC, UA 05/15/2012 3-6  <3 WBC/hpf Final  . Bacteria, UA 05/15/2012 FEW* RARE Final  . WBC 05/16/2012 8.1  4.0 - 10.5 K/uL Final  . RBC 05/16/2012 2.95* 3.87 - 5.11 MIL/uL Final  . Hemoglobin 05/16/2012 8.9* 12.0 - 15.0 g/dL Final  . HCT 46/96/2952 25.9* 36.0 - 46.0 %  Final  . MCV 05/16/2012 87.8  78.0 - 100.0 fL Final  . MCH 05/16/2012 30.2  26.0 - 34.0 pg Final  . MCHC 05/16/2012 34.4  30.0 - 36.0 g/dL Final  . RDW 84/13/2440 13.0  11.5 - 15.5 % Final  . Platelets 05/16/2012 215  150 - 400 K/uL Final  Hospital Outpatient Visit on 05/06/2012  Component Date Value Range Status  . MRSA, PCR 05/06/2012 NEGATIVE  NEGATIVE Final  . Staphylococcus aureus 05/06/2012 NEGATIVE  NEGATIVE Final   Comment:                                 The Xpert SA Assay (FDA                          approved for NASAL specimens                          in patients over 63 years of age),                          is one component of                          a comprehensive surveillance                          program.  Test performance has                          been  validated by [facility] for patients greater                          than or equal to 53 year old.                          It is not intended                          to diagnose infection nor to                          guide or monitor treatment.  Marland Kitchen aPTT 05/06/2012 44* 24 - 37 seconds Final   Comment:                                 IF BASELINE aPTT IS ELEVATED,                          SUGGEST PATIENT RISK ASSESSMENT                          BE USED TO DETERMINE APPROPRIATE                          ANTICOAGULANT THERAPY.  . WBC 05/06/2012 5.3  4.0 - 10.5 K/uL Final  . RBC 05/06/2012 4.39  3.87 - 5.11 MIL/uL Final  . Hemoglobin 05/06/2012 12.9  12.0 - 15.0 g/dL Final  . HCT 16/12/9602 38.1  36.0 - 46.0 % Final  . MCV 05/06/2012 86.8  78.0 - 100.0 fL Final  . MCH 05/06/2012 29.4  26.0 - 34.0 pg Final  . MCHC 05/06/2012 33.9  30.0 - 36.0 g/dL Final  . RDW 54/11/8117 13.2  11.5 - 15.5 % Final  . Platelets 05/06/2012 292  150 - 400 K/uL Final  . Sodium 05/06/2012 138  135 - 145 mEq/L Final  . Potassium 05/06/2012 3.9  3.5 - 5.1 mEq/L Final  . Chloride 05/06/2012 102  96 - 112  mEq/L Final  . CO2 05/06/2012 28  19 - 32 mEq/L Final  . Glucose, Bld 05/06/2012 81  70 - 99 mg/dL Final  . BUN 14/78/2956 12  6 - 23 mg/dL Final  . Creatinine, Ser 05/06/2012 0.75  0.50 - 1.10 mg/dL Final  . Calcium 21/30/8657 9.4  8.4 - 10.5 mg/dL Final  . Total Protein 05/06/2012 6.8  6.0 - 8.3 g/dL Final  . Albumin 84/69/6295 3.7  3.5 - 5.2 g/dL Final  . AST 28/41/3244 33  0 - 37 U/L Final  . ALT 05/06/2012 28  0 - 35 U/L Final  . Alkaline Phosphatase 05/06/2012 105  39 - 117 U/L Final  . Total Bilirubin 05/06/2012 0.5  0.3 - 1.2 mg/dL Final  . GFR calc non Af Amer 05/06/2012 >90  >90 mL/min Final  . GFR calc Af Amer 05/06/2012 >90  >90 mL/min Final   Comment:  The eGFR has been calculated                          using the CKD EPI equation.                          This calculation has not been                          validated in all clinical                          situations.                          eGFR's persistently                          <90 mL/min signify                          possible Chronic Kidney Disease.  Marland Kitchen Prothrombin Time 05/06/2012 12.3  11.6 - 15.2 seconds Final  . INR 05/06/2012 0.92  0.00 - 1.49 Final  . ABO/RH(D) 05/06/2012 AB POS   Final  . Antibody Screen 05/06/2012 NEG   Final  . Sample Expiration 05/06/2012 05/16/2012   Final  . Color, Urine 05/06/2012 YELLOW  YELLOW Final  . APPearance 05/06/2012 CLEAR  CLEAR Final  . Specific Gravity, Urine 05/06/2012 1.009  1.005 - 1.030 Final  . pH 05/06/2012 6.0  5.0 - 8.0 Final  . Glucose, UA 05/06/2012 NEGATIVE  NEGATIVE mg/dL Final  . Hgb urine dipstick 05/06/2012 NEGATIVE  NEGATIVE Final  . Bilirubin Urine 05/06/2012 NEGATIVE  NEGATIVE Final  . Ketones, ur 05/06/2012 NEGATIVE  NEGATIVE mg/dL Final  . Protein, ur 16/12/9602 NEGATIVE  NEGATIVE mg/dL Final  . Urobilinogen, UA 05/06/2012 0.2  0.0 - 1.0 mg/dL Final  . Nitrite 54/11/8117 NEGATIVE  NEGATIVE Final  .  Leukocytes, UA 05/06/2012 TRACE* NEGATIVE Final  . ABO/RH(D) 05/06/2012 AB POS   Final  . Squamous Epithelial / LPF 05/06/2012 RARE  RARE Final  . WBC, UA 05/06/2012 0-2  <3 WBC/hpf Final  . Bacteria, UA 05/06/2012 RARE  RARE Final     X-Rays:Dg Chest 2 View  05/15/2012  *RADIOLOGY REPORT*  Clinical Data: Postoperative fever, history asthma, hypertension, GERD  CHEST - 2 VIEW  Comparison: 09/06/2011  Findings: Normal heart size, mediastinal contours, and pulmonary vascularity. Mild chronic peribronchial thickening. No acute infiltrate, pleural effusion, or pneumothorax. Prior cervical spine fusion and thoracolumbar spinal stabilization.  IMPRESSION: Minimal chronic bronchitic changes. No acute abnormalities.   Original Report Authenticated By: Ulyses Southward, M.D.    Dg Hip Complete Right  05/06/2012  *RADIOLOGY REPORT*  Clinical Data: Preoperative radiograph for right total hip replacement.  RIGHT HIP - COMPLETE 2+ VIEW  Comparison: MRI 07/26/2011.  Findings: There is superior joint space narrowing of the right hip relative to the left compatible with osteoarthritis. Both acetabula appear shallow.  No AVN.  Small marginal osteophytes in the right hip.  Irregularity of the right supra-acetabular ilium, likely post- traumatic.  IMPRESSION: Right hip osteoarthritis.  No acute osseous abnormality.   Original Report Authenticated By: Andreas Newport, M.D.    Dg Pelvis Portable  05/13/2012  *RADIOLOGY REPORT*  Clinical Data: Postop  PORTABLE PELVIS  Comparison: 07/26/2011  Findings: Right total hip arthroplasty has been performed.  There is anatomic alignment of the osseous and prosthetic structures. The acetabular screw breaches the cortex of the pelvic ring.  No breakage or loosening of the hardware.  Lumbar stabilization hardware is in place.  IMPRESSION: Right total arthroplasty anatomically aligned.   Original Report Authenticated By: Jolaine Click, M.D.    Dg Hip Portable 1 View Right  05/13/2012  *RADIOLOGY  REPORT*  Clinical Data: Postop  PORTABLE RIGHT HIP - 1 VIEW  Comparison: 05/06/2012  Findings: Right total hip arthroplasty has been performed. Anatomic alignment of the osseous and prosthetic structures.  The acetabular screw breaches the medial wall of the pelvic ring.  No breakage or loosening of the hardware. Surgical drain is in place.  IMPRESSION: Right total hip arthroplasty anatomically aligned.   Original Report Authenticated By: Jolaine Click, M.D.    Dg C-arm 1-60 Min-no Report  05/13/2012  CLINICAL DATA: right total hip   C-ARM 1-60 MINUTES  Fluoroscopy was utilized by the requesting physician.  No radiographic  interpretation.      EKG: Orders placed during the hospital encounter of 09/20/11  . EKG     Hospital Course: Patient was admitted to [facility] and taken to the OR and underwent the above state procedure without complications.  Patient tolerated the procedure well and was later transferred to the recovery room and then to the orthopaedic floor for postoperative care.  They were given PO and IV analgesics for pain control following their surgery.  They were given 24 hours of postoperative antibiotics of  Anti-infectives   Start     Dose/Rate Route Frequency Ordered Stop   05/16/12 0800  ciprofloxacin (CIPRO) tablet 500 mg     500 mg Oral 2 times daily 05/16/12 0728 05/19/12 0759   05/16/12 0000  ciprofloxacin (CIPRO) 500 MG tablet     500 mg Oral 2 times daily 05/16/12 0737     05/13/12 2000  ceFAZolin (ANCEF) IVPB 1 g/50 mL premix     1 g 100 mL/hr over 30 Minutes Intravenous Every 6 hours 05/13/12 1759 05/14/12 0310   05/13/12 1200  ceFAZolin (ANCEF) IVPB 2 g/50 mL premix     2 g 100 mL/hr over 30 Minutes Intravenous On call to O.R. 05/13/12 1148 05/13/12 1418     and started on DVT prophylaxis in the form of Xarelto.   PT and OT were ordered for total hip protocol.  The patient was allowed to be WBAT with therapy. Discharge planning was consulted to help with  postop disposition and equipment needs.  Patient had a tough night on the evening of surgery with some pain but also had issues with low pressures..  They started to get up OOB with therapy on day one.  Hemovac drain was pulled without difficulty.  Continued to work with therapy into day two.  Patient reported pain as mild and moderate. She was feeling better though. She had some minor complaints of elevated temps thru the night. She had a UA done which was borderline. Dressing was changed on day two and the incision was healing well.  By day three, her temperature had improved some and been running lower over the past 24 hours. Her UA was borderline so it was treated due to the temperature. Her CXR was okay. WBC was normal. HGB was stable at that time. The patient had progressed with therapy but developed issues again with low pressures and was orthostatic.  She was kept on day three and give additional fluids.  Incision was healing well.  Patient was seen in rounds on day four and noted to have a low HGB of 8.1.  She stated that she had required blood before and was asking about it.  She was transfuse two units and then worked with therapy again.  She got up and about with therapy doing better.  She was seen the following day on 05/18/2012 and was doing better.  She was then discharged home at that time.   Discharge Medications: Prior to Admission medications   Medication Sig Start Date End Date Taking? Authorizing Provider  albuterol (PROVENTIL HFA;VENTOLIN HFA) 108 (90 BASE) MCG/ACT inhaler Inhale 2 puffs into the lungs every 6 (six) hours as needed for wheezing.   Yes Historical Provider, MD  ALPRAZolam Prudy Feeler) 1 MG tablet Take 1-2 mg by mouth 2 (two) times daily as needed for anxiety.   Yes Historical Provider, MD  buPROPion (WELLBUTRIN SR) 150 MG 12 hr tablet Take 150 mg by mouth 2 (two) times daily.   Yes Historical Provider, MD  metoprolol succinate (TOPROL-XL) 50 MG 24 hr tablet Take 50 mg by mouth  daily before breakfast. Take with or immediately following a meal.   Yes Historical Provider, MD  ciprofloxacin (CIPRO) 500 MG tablet Take 1 tablet (500 mg total) by mouth 2 (two) times daily. 05/16/12   Mattia Liford, PA-C  cyclobenzaprine (FLEXERIL) 10 MG tablet Take 1 tablet (10 mg total) by mouth 3 (three) times daily as needed for muscle spasms. 05/16/12   Goldie Dimmer Julien Girt, PA-C  iron polysaccharides (NIFEREX) 150 MG capsule Take 1 capsule (150 mg total) by mouth daily. 05/16/12   Brody Kump, PA-C  oxyCODONE (OXY IR/ROXICODONE) 5 MG immediate release tablet Take 1-4 tablets (5-20 mg total) by mouth every 3 (three) hours as needed. 05/16/12   Ife Vitelli Julien Girt, PA-C  rivaroxaban (XARELTO) 10 MG TABS tablet Take 1 tablet (10 mg total) by mouth daily with breakfast. Take Xarelto for two and a half more weeks, then discontinue Xarelto. 05/16/12   Paislynn Hegstrom, PA-C  traMADol (ULTRAM) 50 MG tablet Take 1-2 tablets (50-100 mg total) by mouth every 6 (six) hours as needed. 05/16/12   Diaz Crago Julien Girt, PA-C    Diet: Cardiac diet Activity:WBAT Follow-up:in 2 weeks Disposition - Home Discharged Condition: Improving   Discharge Orders   Future Orders Complete By Expires     Call MD / Call 911  As directed     Comments:      If you experience chest pain or shortness of breath, CALL 911 and be transported to the hospital emergency room.  If you develope a fever above 101 F, pus (white drainage) or increased drainage or redness at the wound, or calf pain, call your surgeon's office.    Change dressing  As directed     Comments:      You may change your dressing dressing daily with sterile 4 x 4 inch gauze dressing and paper tape.  Do not submerge the incision under water.    Constipation Prevention  As directed     Comments:      Drink plenty of fluids.  Prune juice may be helpful.  You may use a stool softener, such as Colace (over the counter) 100 mg twice a day.  Use MiraLax  (over the counter) for constipation as needed.    Diet - low sodium heart healthy  As directed     Discharge instructions  As directed     Comments:      Pick up stool softner and laxative for home. Do not submerge incision under water. May shower. Continue to use ice for pain and swelling from surgery. Total Hip Protocol.  Take Xarelto for two and a half more weeks, then discontinue Xarelto.    Do not sit on low chairs, stoools or toilet seats, as it may be difficult to get up from low surfaces  As directed     Driving restrictions  As directed     Comments:      No driving until released by the physician.    Increase activity slowly as tolerated  As directed     Lifting restrictions  As directed     Comments:      No lifting until released by the physician.    Patient may shower  As directed     Comments:      You may shower without a dressing once there is no drainage.  Do not wash over the wound.  If drainage remains, do not shower until drainage stops.    TED hose  As directed     Comments:      Use stockings (TED hose) for 3 weeks on both leg(s).  You may remove them at night for sleeping.    Weight bearing as tolerated  As directed         Medication List    STOP taking these medications       ibuprofen 200 MG tablet  Commonly known as:  ADVIL,MOTRIN     oxyCODONE-acetaminophen 10-325 MG per tablet  Commonly known as:  PERCOCET      TAKE these medications       albuterol 108 (90 BASE) MCG/ACT inhaler  Commonly known as:  PROVENTIL HFA;VENTOLIN HFA  Inhale 2 puffs into the lungs every 6 (six) hours as needed for wheezing.     ALPRAZolam 1 MG tablet  Commonly known as:  XANAX  Take 1-2 mg by mouth 2 (two) times daily as needed for anxiety.     buPROPion 150 MG 12 hr tablet  Commonly known as:  WELLBUTRIN SR  Take 150 mg by mouth 2 (two) times daily.     ciprofloxacin 500 MG tablet  Commonly known as:  CIPRO  Take 1 tablet (500 mg total) by mouth 2 (two)  times daily.     cyclobenzaprine 10 MG tablet  Commonly known as:  FLEXERIL  Take 1 tablet (10 mg total) by mouth 3 (three) times daily as needed for muscle spasms.     iron polysaccharides 150 MG capsule  Commonly known as:  NIFEREX  Take 1 capsule (150 mg total) by mouth daily.     metoprolol succinate 50 MG 24 hr tablet  Commonly known as:  TOPROL-XL  Take 50 mg by mouth daily before breakfast. Take with or immediately following a meal.     oxyCODONE 5 MG immediate release tablet  Commonly known as:  Oxy IR/ROXICODONE  Take 1-4 tablets (5-20 mg total) by mouth every 3 (three) hours as needed.     rivaroxaban 10 MG Tabs tablet  Commonly known as:  XARELTO  Take 1 tablet (10 mg total) by mouth daily with breakfast. Take Xarelto for two and a half more weeks, then discontinue Xarelto.     traMADol 50 MG tablet  Commonly known as:  ULTRAM  Take 1-2 tablets (50-100 mg total) by mouth every 6 (six) hours  as needed.           Follow-up Information   Follow up with Loanne Drilling, MD. Schedule an appointment as soon as possible for a visit in 2 weeks.   Contact information:   [address] [place] [address] [phone]       Signed: Patrica Duel 05/16/2012, 7:39 AM

## 2012-05-16 NOTE — Progress Notes (Signed)
Occupational Therapy Treatment Patient Details Name: [name] MRN: [id number] DOB: 1960-10-03 Today's Date: 05/16/2012 Time: 2952-8413 OT Time Calculation (min): 33 min  OT Assessment / Plan / Recommendation Comments on Treatment Session      Follow Up Recommendations  No OT follow up    Barriers to Discharge       Equipment Recommendations  Tub/shower bench    Recommendations for Other Services    Frequency     Plan      Precautions / Restrictions Precautions Precautions: Fall Restrictions RLE Weight Bearing: Weight bearing as tolerated   Pertinent Vitals/Pain R hip sore.  Pt lightheaded after taking shower:  Felt better once reclined in chair    ADL  Lower Body Bathing: Minimal assistance (without AE) Where Assessed - Lower Body Bathing: Supported sit to stand Toilet Transfer: Minimal assistance Toilet Transfer Method: Sit to Barista: Bedside commode Tub/Shower Transfer: Performed;Minimal assistance Tub/Shower Transfer Method: Science writer: Transfer tub bench;Walk in shower Equipment Used: Rolling walker Transfers/Ambulation Related to ADLs: pt lightheaded after taking shower:  NT called to stand by.  Better when she got to chair and reclined ADL Comments: Practiced tub bench: pt wants to get one.  Performed shower in shower stall as pt has been feverish and wanted to shower prior to leaving.      OT Diagnosis:    OT Problem List:   OT Treatment Interventions:     OT Goals ADL Goals Pt Will Transfer to Toilet: with supervision;Ambulation;3-in-1 ADL Goal: Toilet Transfer - Progress: Progressing toward goals Pt Will Perform Tub/Shower Transfer: with min assist;Ambulation;Transfer tub bench;Tub transfer ADL Goal: Tub/Shower Transfer - Progress: Met  Visit Information  Last OT Received On: 05/16/12 Assistance Needed: +2 (out of shower due to lightheadedness)    Subjective Data      Prior  Functioning       Cognition  Cognition Overall Cognitive Status: Appears within functional limits for tasks assessed/performed Arousal/Alertness: Awake/alert Orientation Level: Appears intact for tasks assessed Behavior During Session: [facility] for tasks performed    Mobility  Bed Mobility Supine to Sit: 5: Supervision;HOB flat Transfers Sit to Stand: 4: Min guard;From chair/3-in-1;From bed;With upper extremity assist Details for Transfer Assistance: vcs for hand placement    Exercises      Balance     End of Session OT - End of Session Activity Tolerance: Other (comment) (limited by lightheadeness) Patient left: in chair;with call bell/phone within reach Nurse Communication:  (NT assist with dressing)  GO     [name] 05/16/2012, 9:01 AM [name], OTR/L [phone] 05/16/2012

## 2012-05-17 LAB — URINE CULTURE

## 2012-05-17 LAB — BASIC METABOLIC PANEL
BUN: 8 mg/dL (ref 6–23)
CO2: 26 mEq/L (ref 19–32)
Calcium: 8.5 mg/dL (ref 8.4–10.5)
Creatinine, Ser: 0.63 mg/dL (ref 0.50–1.10)
Glucose, Bld: 136 mg/dL — ABNORMAL HIGH (ref 70–99)

## 2012-05-17 LAB — CBC
Hemoglobin: 8.1 g/dL — ABNORMAL LOW (ref 12.0–15.0)
MCH: 29.8 pg (ref 26.0–34.0)
MCV: 88.2 fL (ref 78.0–100.0)
RBC: 2.72 MIL/uL — ABNORMAL LOW (ref 3.87–5.11)

## 2012-05-17 LAB — PREPARE RBC (CROSSMATCH)

## 2012-05-17 MED ORDER — ACETAMINOPHEN 325 MG PO TABS
650.0000 mg | ORAL_TABLET | Freq: Once | ORAL | Status: AC
  Administered 2012-05-17: 650 mg via ORAL

## 2012-05-17 NOTE — Progress Notes (Signed)
Subjective: 4 Days Post-Op Procedure(s) (LRB): TOTAL HIP ARTHROPLASTY ANTERIOR APPROACH (Right) Patient reports pain as mild.   Patient seen in rounds for Dr. Lequita Halt. Patient is feeling a littel better but still weak.  she states that she had to get blood int he past.  She is asking about it this morning.  Will give two units, therapy and plan home later today.  If pressures are still running low, may consider cutting her BP med in half temporarily. Patient is ready to go home.  Objective: Vital signs in last 24 hours: Temp:  [98.7 F (37.1 C)-99.9 F (37.7 C)] 98.7 F (37.1 C) (03/07 0600) Pulse Rate:  [81-121] 88 (03/07 0600) Resp:  [16-18] 18 (03/06 1600) BP: (78-112)/(48-79) 89/59 mmHg (03/07 0600) SpO2:  [97 %-100 %] 98 % (03/07 0600)  Intake/Output from previous day:  Intake/Output Summary (Last 24 hours) at 05/17/12 0716 Last data filed at 05/16/12 2143  Gross per 24 hour  Intake 1677.5 ml  Output   1300 ml  Net  377.5 ml    Intake/Output this shift:    Labs:  Recent Labs  05/15/12 0400 05/16/12 0415 05/17/12 0400  HGB 8.5* 8.9* 8.1*    Recent Labs  05/16/12 0415 05/17/12 0400  WBC 8.1 5.9  RBC 2.95* 2.72*  HCT 25.9* 24.0*  PLT 215 215    Recent Labs  05/15/12 0400 05/17/12 0400  NA 137 134*  K 4.0 4.0  CL 104 102  CO2 24 26  BUN 6 8  CREATININE 0.65 0.63  GLUCOSE 136* 136*  CALCIUM 8.2* 8.5   No results found for this basename: LABPT, INR,  in the last 72 hours  EXAM: General - Patient is Alert, Appropriate and Oriented Extremity - Neurovascular intact Sensation intact distally Dorsiflexion/Plantar flexion intact Incision - clean, dry, no drainage, healing Motor Function - intact, moving foot and toes well on exam.   Assessment/Plan: 4 Days Post-Op Procedure(s) (LRB): TOTAL HIP ARTHROPLASTY ANTERIOR APPROACH (Right) Procedure(s) (LRB): TOTAL HIP ARTHROPLASTY ANTERIOR APPROACH (Right) Past Medical History  Diagnosis Date  .  Headache     AND NECK PAIN--STATES RECENT TEST SHOW CERVICAL DEGENERATION  . PONV (postoperative nausea and vomiting)     PT GIVES HX OF N&V AND FEVER WITH SURGERIES YEARS AGO--BUT NO PROBLEMS WITH MORE RECENT SURGERIES--STATES NOT MALIGNANT HYPERTHERMIA  . 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 Hypertension   . Anxiety   . Depression     PT STATES A LOT OF STRESS IN HER LIFE  . Asthma     INHALERS WITH FLARE -UPS  . GERD (gastroesophageal reflux disease)   . DDD (degenerative disc disease)     CERVICAL AND LUMBAR-CHRONIC PAIN, RT HIP LABRAL TEAR  . Pain     CHRONIC NECK AND BACK PAIN - LIMITED ROM NECK - S/P FUSIONS CERVICAL AND LUMBAR  . Anemia     Iron Infusion-8 yrs ago  . History of blood transfusion     s/p back surgery   Principal Problem:   OA (osteoarthritis) of hip Active Problems:   Postoperative anemia due to acute blood loss   Postop Hyponatremia  Estimated body mass index is 25.05 kg/(m^2) as calculated from the following:   Height as of this encounter: 5\' 4"  (1.626 m).   Weight as of this encounter: 66.225 kg (146 lb). Up with therapy Diet - Cardiac  diet Follow up - in 2 weeks Activity - WBAT Disposition - Home Condition Upon Discharge - Improving, home after blood and therapy. D/C Meds - See DC Summary DVT Prophylaxis - Xarelto  PERKINS, ALEXZANDREW 05/17/2012, 7:16 AM

## 2012-05-17 NOTE — Care Management Note (Signed)
[facility] is servicing this pt. The [facility] branch number is [phone]   Elwin Sleight

## 2012-05-17 NOTE — Progress Notes (Signed)
Physical Therapy Treatment Patient Details Name: [name] MRN: [id number] DOB: 08/25/1960 Today's Date: 05/17/2012 Time: 2952-8413 PT Time Calculation (min): 38 min  PT Assessment / Plan / Recommendation Comments on Treatment Session  Marked improvement in activity tolerance with no c/o dizziness  Reviewed car transfers    Follow Up Recommendations  Home health PT;Supervision for mobility/OOB     Does the patient have the potential to tolerate intense rehabilitation     Barriers to Discharge        Equipment Recommendations  None recommended by PT    Recommendations for Other Services OT consult  Frequency 7X/week   Plan Discharge plan remains appropriate    Precautions / Restrictions Precautions Precautions: Fall Restrictions Weight Bearing Restrictions: No RLE Weight Bearing: Weight bearing as tolerated   Pertinent Vitals/Pain 5/10 with activity; premedicated    Mobility  Bed Mobility Bed Mobility: Supine to Sit;Sit to Supine Supine to Sit: 5: Supervision Transfers Transfers: Sit to Stand;Stand to Sit Sit to Stand: 4: Min guard;From bed;From chair/3-in-1 Stand to Sit: To bed;To chair/3-in-1;4: Min guard Details for Transfer Assistance: vcs for hand placement Ambulation/Gait Ambulation/Gait Assistance: 4: Min guard Ambulation Distance (Feet): 150 Feet Assistive device: Rolling walker Ambulation/Gait Assistance Details: min cues for posture and position from RW Gait Pattern: Step-through pattern Stairs: Yes Stairs Assistance: 4: Min assist Stairs Assistance Details (indicate cue type and reason): cues for sequence and crutch/foot placement Stair Management Technique: Step to pattern;Forwards;With crutches;One rail Left Number of Stairs: 4    Exercises Total Joint Exercises Ankle Circles/Pumps: AROM;Both;Supine;20 reps Quad Sets: AROM;Both;Supine;20 reps Heel Slides: AAROM;Right;Supine;20 reps Hip ABduction/ADduction: AAROM;Right;Supine;20 reps   PT  Diagnosis:    PT Problem List:   PT Treatment Interventions:     PT Goals Acute Rehab PT Goals PT Goal Formulation: With patient Time For Goal Achievement: 05/21/12 Potential to Achieve Goals: Good Pt will go Supine/Side to Sit: with supervision PT Goal: Supine/Side to Sit - Progress: Met Pt will go Sit to Supine/Side: with supervision PT Goal: Sit to Supine/Side - Progress: Progressing toward goal Pt will go Sit to Stand: with supervision PT Goal: Sit to Stand - Progress: Progressing toward goal Pt will Ambulate: 51 - 150 feet;with supervision;with rolling walker PT Goal: Ambulate - Progress: Progressing toward goal Pt will Go Up / Down Stairs: 3-5 stairs;with rail(s);with least restrictive assistive device;with min assist PT Goal: Up/Down Stairs - Progress: Met Pt will Perform Home Exercise Program: with supervision, verbal cues required/provided PT Goal: Perform Home Exercise Program - Progress: Progressing toward goal  Visit Information  Last PT Received On: 05/17/12 Assistance Needed: +1    Subjective Data  Subjective: I am feeling much better than yesterday Patient Stated Goal: get better. home   Cognition  Cognition Overall Cognitive Status: Appears within functional limits for tasks assessed/performed Arousal/Alertness: Awake/alert Orientation Level: Appears intact for tasks assessed Behavior During Session: [facility] for tasks performed    Balance     End of Session PT - End of Session Equipment Utilized During Treatment: Gait belt Activity Tolerance: Patient tolerated treatment well Patient left: in chair;with call bell/phone within reach Nurse Communication: Mobility status   GP     BRADSHAW,HUNTER 05/17/2012, 3:50 PM

## 2012-05-18 LAB — TYPE AND SCREEN: Unit division: 0

## 2012-05-18 NOTE — Progress Notes (Signed)
   Subjective: 5 Days Post-Op Procedure(s) (LRB): TOTAL HIP ARTHROPLASTY ANTERIOR APPROACH (Right)   Patient reports pain as mild, pain well controlled in the hip. However she does state that she has some issue with knee pain on the same side. She is instructed to ice and elevate the knee, if still an issue at follow up they may be able to do something more. No events throughout the night. Ready to be discharged home.  Objective:   VITALS:   Filed Vitals:   05/18/12 0511  BP: 120/81  Pulse: 88  Temp: 98.9 F (37.2 C)  Resp: 16    Neurovascular intact Dorsiflexion/Plantar flexion intact Incision: dressing C/D/I No cellulitis present Compartment soft Generalized right knee pain  LABS  Recent Labs  05/16/12 0415 05/17/12 0400  HGB 8.9* 8.1*  HCT 25.9* 24.0*  WBC 8.1 5.9  PLT 215 215     Recent Labs  05/17/12 0400  NA 134*  K 4.0  BUN 8  CREATININE 0.63  GLUCOSE 136*     Assessment/Plan: 5 Days Post-Op Procedure(s) (LRB): TOTAL HIP ARTHROPLASTY ANTERIOR APPROACH (Right)  Up with therapy Discharge home with home health Follow up in 2 weeks at [facility]. Follow up with Dr. Lequita Halt in 2 weeks.  Contact information:  [facility] [address] [place] [address] [phone]        Anastasio Auerbach. Babish   PAC  05/18/2012, 12:20 PM

## 2012-05-18 NOTE — Progress Notes (Signed)
Physical Therapy Treatment Patient Details Name: [name] MRN: [id number] DOB: 1960-07-16 Today's Date: 05/18/2012 Time: 4098-1191 PT Time Calculation (min): 28 min  PT Assessment / Plan / Recommendation Comments on Treatment Session       Follow Up Recommendations  Home health PT;Supervision for mobility/OOB     Does the patient have the potential to tolerate intense rehabilitation     Barriers to Discharge        Equipment Recommendations  None recommended by PT    Recommendations for Other Services OT consult  Frequency 7X/week   Plan Discharge plan remains appropriate    Precautions / Restrictions Precautions Precautions: Fall Restrictions Weight Bearing Restrictions: No RLE Weight Bearing: Weight bearing as tolerated   Pertinent Vitals/Pain     Mobility  Bed Mobility Bed Mobility: Supine to Sit Supine to Sit: 5: Supervision Details for Bed Mobility Assistance: pt educated on use of sheet to assist with R LE movement Transfers Transfers: Sit to Stand;Stand to Sit Sit to Stand: 5: Supervision Stand to Sit: 5: Supervision Details for Transfer Assistance: vcs for hand placement Ambulation/Gait Ambulation/Gait Assistance: 5: Supervision;4: Min guard Ambulation Distance (Feet): 133 Feet Assistive device: Rolling walker Ambulation/Gait Assistance Details: min cues for position from RW Gait Pattern: Step-through pattern Stairs: Yes Stairs Assistance: 4: Min assist Stairs Assistance Details (indicate cue type and reason): cues for sequence Stair Management Technique: Step to pattern;Backwards;With walker Number of Stairs: 1 (for use of stool to get into high bed)    Exercises Total Joint Exercises Ankle Circles/Pumps: AROM;Both;Supine;20 reps Quad Sets: AROM;Both;Supine;20 reps Gluteal Sets: AROM;10 reps;Supine;Both Heel Slides: AAROM;Right;Supine;20 reps Hip ABduction/ADduction: AAROM;Right;Supine;20 reps   PT Diagnosis:    PT Problem List:   PT  Treatment Interventions:     PT Goals Acute Rehab PT Goals PT Goal Formulation: With patient Time For Goal Achievement: 05/21/12 Potential to Achieve Goals: Good Pt will go Supine/Side to Sit: with supervision PT Goal: Supine/Side to Sit - Progress: Met Pt will go Sit to Supine/Side: with supervision PT Goal: Sit to Supine/Side - Progress: Progressing toward goal Pt will go Sit to Stand: with supervision PT Goal: Sit to Stand - Progress: Met Pt will Ambulate: 51 - 150 feet;with supervision;with rolling walker PT Goal: Ambulate - Progress: Met Pt will Go Up / Down Stairs: 3-5 stairs;with rail(s);with least restrictive assistive device;with min assist PT Goal: Up/Down Stairs - Progress: Met Pt will Perform Home Exercise Program: with supervision, verbal cues required/provided PT Goal: Perform Home Exercise Program - Progress: Progressing toward goal  Visit Information  Last PT Received On: 05/18/12 Assistance Needed: +1    Subjective Data  Subjective: I'm going home today Patient Stated Goal: get better. home   Cognition  Cognition Overall Cognitive Status: Appears within functional limits for tasks assessed/performed Arousal/Alertness: Awake/alert Orientation Level: Appears intact for tasks assessed Behavior During Session: [facility] for tasks performed    Balance     End of Session PT - End of Session Activity Tolerance: Patient tolerated treatment well Patient left: in chair;with call bell/phone within reach Nurse Communication: Mobility status   GP     BRADSHAW,HUNTER 05/18/2012, 12:52 PM

## 2012-10-03 ENCOUNTER — Other Ambulatory Visit: Payer: Self-pay | Admitting: Orthopedic Surgery

## 2012-10-03 DIAGNOSIS — M549 Dorsalgia, unspecified: Secondary | ICD-10-CM

## 2012-10-08 ENCOUNTER — Ambulatory Visit
Admission: RE | Admit: 2012-10-08 | Discharge: 2012-10-08 | Disposition: A | Discharge status: Home / Self Care | Payer: Medicaid Other | Source: Ambulatory Visit | Attending: Orthopedic Surgery | Admitting: Orthopedic Surgery

## 2012-10-08 VITALS — BP 116/82 | HR 64

## 2012-10-08 DIAGNOSIS — M549 Dorsalgia, unspecified: Secondary | ICD-10-CM

## 2012-10-08 MED ORDER — IOHEXOL 180 MG/ML  SOLN
20.0000 mL | Freq: Once | INTRAMUSCULAR | Status: AC | PRN
  Administered 2012-10-08: 20 mL via INTRATHECAL

## 2012-10-08 MED ORDER — MEPERIDINE HCL 100 MG/ML IJ SOLN
100.0000 mg | Freq: Once | INTRAMUSCULAR | Status: AC
  Administered 2012-10-08: 100 mg via INTRAMUSCULAR

## 2012-10-08 MED ORDER — DIPHENHYDRAMINE HCL 50 MG PO CAPS
50.0000 mg | ORAL_CAPSULE | Freq: Once | ORAL | Status: AC
  Administered 2012-10-08: 50 mg via ORAL

## 2012-10-08 MED ORDER — DIAZEPAM 5 MG PO TABS
10.0000 mg | ORAL_TABLET | Freq: Once | ORAL | Status: AC
  Administered 2012-10-08: 10 mg via ORAL

## 2012-10-08 MED ORDER — ONDANSETRON HCL 4 MG/2ML IJ SOLN
4.0000 mg | Freq: Once | INTRAMUSCULAR | Status: AC
  Administered 2012-10-08: 4 mg via INTRAMUSCULAR

## 2012-10-08 NOTE — Progress Notes (Signed)
Zantac 150mg  PO given per orders from Dr. Bonnielee Haff.  Unable to order/locate in EPIC.

## 2012-10-08 NOTE — Progress Notes (Signed)
Pt states she has been off wellbutrin for the past 3 days.  Discharge instructions explained.

## 2012-10-22 ENCOUNTER — Encounter (HOSPITAL_COMMUNITY): Payer: Self-pay | Admitting: Pharmacy Technician

## 2012-10-24 NOTE — Pre-Procedure Instructions (Signed)
[name]  10/24/2012   Your procedure is scheduled on:  Wednesday, August 20th  Report to [facility] at 0930 AM.  Call this number if you have problems the morning of surgery: [phone]   Remember:   Do not eat food or drink liquids after midnight.   Take these medicines the morning of surgery with A SIP OF WATER: xanax if needed, norvasc, wellbutrin, toprol, percocet if needed, albuterol if needed, advair   Do not wear jewelry, make-up or nail polish.  Do not wear lotions, powders, or perfumes. You may wear deodorant.  Do not shave 48 hours prior to surgery. Men may shave face and neck.  Do not bring valuables to the hospital.  [facility] is not responsible  for any belongings or valuables.  Contacts, dentures or bridgework may not be worn into surgery.  Leave suitcase in the car. After surgery it may be brought to your room.  For patients admitted to the hospital, checkout time is 11:00 AM the day of discharge.   Patients discharged the day of surgery will not be allowed to drive home.   Special Instructions: Shower using CHG 2 nights before surgery and the night before surgery.  If you shower the day of surgery use CHG.  Use special wash - you have one bottle of CHG for all showers.  You should use approximately 1/3 of the bottle for each shower.   Please read over the following fact sheets that you were given: Pain Booklet, Coughing and Deep Breathing, MRSA Information and Surgical Site Infection Prevention

## 2012-10-25 ENCOUNTER — Encounter (HOSPITAL_COMMUNITY): Payer: Self-pay

## 2012-10-25 ENCOUNTER — Encounter (HOSPITAL_COMMUNITY)
Admission: RE | Admit: 2012-10-25 | Discharge: 2012-10-25 | Disposition: A | Discharge status: Home / Self Care | Payer: Medicaid Other | Source: Ambulatory Visit | Attending: Orthopedic Surgery | Admitting: Orthopedic Surgery

## 2012-10-25 DIAGNOSIS — Z01810 Encounter for preprocedural cardiovascular examination: Secondary | ICD-10-CM | POA: Insufficient documentation

## 2012-10-25 DIAGNOSIS — Z01812 Encounter for preprocedural laboratory examination: Secondary | ICD-10-CM | POA: Insufficient documentation

## 2012-10-25 DIAGNOSIS — Z01818 Encounter for other preprocedural examination: Secondary | ICD-10-CM | POA: Insufficient documentation

## 2012-10-25 HISTORY — DX: Foot drop, right foot: M21.371

## 2012-10-25 HISTORY — DX: Abnormal electrocardiogram (ECG) (EKG): R94.31

## 2012-10-25 HISTORY — DX: Personal history of urinary calculi: Z87.442

## 2012-10-25 HISTORY — DX: Pneumonia, unspecified organism: J18.9

## 2012-10-25 LAB — CBC
Platelets: 290 10*3/uL (ref 150–400)
RBC: 4.24 MIL/uL (ref 3.87–5.11)
RDW: 13 % (ref 11.5–15.5)
WBC: 5.3 10*3/uL (ref 4.0–10.5)

## 2012-10-25 LAB — BASIC METABOLIC PANEL
Calcium: 9.5 mg/dL (ref 8.4–10.5)
Creatinine, Ser: 0.75 mg/dL (ref 0.50–1.10)
GFR calc Af Amer: >90 mL/min (ref >90)
GFR calc non Af Amer: >90 mL/min (ref >90)
Sodium: 139 mEq/L (ref 135–145)

## 2012-10-25 NOTE — Progress Notes (Signed)
During PAT it was noted that patient is allergic to CHG. Nurse called Dr. Shon Baton office and informed him of this and he ordered for patient to bathe with just "soap and water." Will inform patient of this.

## 2012-10-25 NOTE — Progress Notes (Signed)
[name] in lab informed Nurse that another PCR would need to be obtained the DOS. Orders from Dr. Shon Baton were put in after patient left PAT so consent form, and Xray will need to be signed DOS. Patient will also need TED Hose the DOS.

## 2012-10-29 MED ORDER — CEFAZOLIN SODIUM-DEXTROSE 2-3 GM-% IV SOLR
2.0000 g | INTRAVENOUS | Status: AC
  Administered 2012-10-30: 2 g via INTRAVENOUS
  Filled 2012-10-29: qty 50

## 2012-10-29 NOTE — H&P (Signed)
History of Present Illness The patient is a 52 year old female who presents today for follow up of their back. The patient is being followed for their right-sided (down to the right foot). They are now 1 week(s) (post Right S1 Transforaminal injection w/ lidocaine and D5W on 09/16/12) out. Symptoms reported today include: pain, pain at night, weakness (right and does wear an AFO and falls without it) and pain with sitting. The patient states that they are doing poorly (and getting worse). Current treatment includes: bracing (AFO) and pain medications (Percocet 10/325 ). The patient reports their current pain level to be severe (at times) and 7 / 10. The patient presents today following CT scan (10/08/2012 at [facility]). The patient has reported improvement of their symptoms with: activity modification and bracing.    Subjective Transcription  She continues to have significant neck and radicular right arm pain in the C6 distribution. Her CT myelogram showed a solid fusion in the thoracolumbar spine. No significant adjacent segment disease at the 4-5 and 5-1 levels. At this point in time, she still has neck pain with radiation into the arm. She has no focal motor deficits, but she does have numbness and dysesthesias in the C6 distribution. No shortness of breath or chest pain.   Over this year she has had a right C6 selective nerve root block which she just had a right S1 selective nerve root block. She states that after the March cervical selective nerve root block her headaches significantly improved, her quality of life with respect to her neck pain improved. Although it was only temporary she did feel significant relief. 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     Allergies(Megan L Fagge; 10/18/2012 10:08 AM) Shellfish Clindamycin HCl *ANTI-INFECTIVE AGENTS - MISC.*. Hives. Sulfa Drugs. Nausea, Vomiting. Codeine/Codeine  Derivatives. Hives. Tylenol #3 Shellfish. 10/01/2007 (Marked as Inactive) CODEINE. 10/01/2007 (Marked as Inactive)   Social History(Megan L Fagge; 10/18/2012 10:08 AM) Post-Surgical Plans. Plan is for home. Current occupation. Nurse Current work status. Disabled. No alcohol use Tobacco use. Never smoker.   Medication History(Megan L Fagge; 10/18/2012 10:09 AM) Norco (5-325MG  Tablet, 1 (one) Tablet Oral PO TID, Taken starting 09/02/2012) Active. Percocet (10-325MG  Tablet, 1-2 Oral po q6h prn pain, Taken starting 06/20/2012) Active. Cyclobenzaprine HCl (10MG  Tablet, 1 (one) Oral po tid prn spasm, Taken starting 05/29/2012) Active. Zofran (4MG  Tablet, 1 (one) Tablet Oral po q6-8h prn nausea, Taken starting 09/21/2011) Active. Methocarbamol (500MG  Tablet, 1 Oral three times daily, as needed, Taken starting 02/13/2011) Active. Lisinopril ( Oral) Specific dose unknown - Active. Wellbutrin ( Oral) Specific dose unknown - Active. Advair Diskus ( Inhalation) Specific dose unknown - Active. Ibuprofen 200 (200MG  Tablet, Oral) Active. Albuterol Sulfate ( Inhalation) Specific dose unknown - Active. Metoprolol Succinate ( Oral) Specific dose unknown - Active. Xanax ( Oral) Specific dose unknown - Active.   Objective Transcription(Kortnee Bas Sheela Stack, MD; 10/21/2012 12:22 PM)  The abdomen is soft and nontender. She is ambulating now with the brace on the right for her right foot drop. The left foot drop has actually improved and is no longer an issue. She has weakness with her gastrocnemius, about 4 to 4+ out of 5. EHL and tibialis anterior seem to be intact. This is all on the right side. Compartments are soft and nontender. Her hip pain is significantly improved since her total hip replacement. She does have anterior knee pain and discomfort with circumduction. No ankle pain. compartments are  soft and nontender. She has horrific neck pain that is now radiating into the C6  distribution. She describes it coming down the bicep into the flexor surface and into the thumb, index, and long finger. At this point in time, clinically she is having some progression in her cervical pathology as it now involves more and more of the right arm.  Positive spurlings sign right arm.  No SOB/CP  Abd soft/nt   Pain with neck palpation and ROM.    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 Willeen Cass, her ENT surgeon. I spoke to his nurse practitioner, who indicated that, based on his last exam, there were no abnormalities of the vocal cords from his laryngoscopy. They faxed me that note, so we will proceed with a right-sided ACDF for her adjacent segment problem. This would include removing the plate at the C7 level and assessing the fusion directly and then proceeding with an ACDF at 5-6. The risks include infection, bleeding, nerve damage, death, stroke, paralysis, failure to heal, need for further surgery, ongoing or worse pain, throat pain, swelling difficulties, hoarseness in the voice and failure to improve. All of her questions were addressed.

## 2012-10-30 ENCOUNTER — Observation Stay (HOSPITAL_COMMUNITY)
Admission: RE | Admit: 2012-10-30 | Discharge: 2012-10-31 | Disposition: A | Discharge status: Home / Self Care | Payer: Medicaid Other | Source: Ambulatory Visit | Attending: Orthopedic Surgery | Admitting: Orthopedic Surgery

## 2012-10-30 ENCOUNTER — Encounter (HOSPITAL_COMMUNITY)
Admission: RE | Disposition: A | Discharge status: Home / Self Care | Payer: Self-pay | Source: Ambulatory Visit | Attending: Orthopedic Surgery

## 2012-10-30 ENCOUNTER — Ambulatory Visit (HOSPITAL_COMMUNITY): Payer: Medicaid Other

## 2012-10-30 ENCOUNTER — Ambulatory Visit (HOSPITAL_COMMUNITY): Payer: Medicaid Other | Admitting: Certified Registered Nurse Anesthetist

## 2012-10-30 ENCOUNTER — Encounter (HOSPITAL_COMMUNITY): Payer: Self-pay | Admitting: Certified Registered Nurse Anesthetist

## 2012-10-30 ENCOUNTER — Observation Stay (HOSPITAL_COMMUNITY): Payer: Medicaid Other

## 2012-10-30 ENCOUNTER — Encounter (HOSPITAL_COMMUNITY): Payer: Self-pay | Admitting: *Deleted

## 2012-10-30 DIAGNOSIS — I10 Essential (primary) hypertension: Secondary | ICD-10-CM | POA: Insufficient documentation

## 2012-10-30 DIAGNOSIS — Z472 Encounter for removal of internal fixation device: Secondary | ICD-10-CM | POA: Insufficient documentation

## 2012-10-30 DIAGNOSIS — Z981 Arthrodesis status: Secondary | ICD-10-CM | POA: Insufficient documentation

## 2012-10-30 DIAGNOSIS — M5090 Cervical disc disorder, unspecified, unspecified cervical region: Secondary | ICD-10-CM | POA: Insufficient documentation

## 2012-10-30 DIAGNOSIS — Z79899 Other long term (current) drug therapy: Secondary | ICD-10-CM | POA: Insufficient documentation

## 2012-10-30 DIAGNOSIS — M5020 Other cervical disc displacement, unspecified cervical region: Principal | ICD-10-CM | POA: Insufficient documentation

## 2012-10-30 HISTORY — PX: ANTERIOR CERVICAL DECOMP/DISCECTOMY FUSION: SHX1161

## 2012-10-30 LAB — SURGICAL PCR SCREEN
MRSA, PCR: POSITIVE — AB
Staphylococcus aureus: POSITIVE — AB

## 2012-10-30 SURGERY — ANTERIOR CERVICAL DECOMPRESSION/DISCECTOMY FUSION 1 LEVEL/HARDWARE REMOVAL
Anesthesia: General | Site: Spine Cervical | Wound class: Clean

## 2012-10-30 MED ORDER — BUPIVACAINE-EPINEPHRINE 0.25% -1:200000 IJ SOLN
INTRAMUSCULAR | Status: DC | PRN
  Administered 2012-10-30: 4 mL

## 2012-10-30 MED ORDER — MIDAZOLAM HCL 5 MG/5ML IJ SOLN
INTRAMUSCULAR | Status: DC | PRN
  Administered 2012-10-30: 2 mg via INTRAVENOUS

## 2012-10-30 MED ORDER — MIDAZOLAM HCL 2 MG/2ML IJ SOLN
0.5000 mg | Freq: Once | INTRAMUSCULAR | Status: AC | PRN
  Administered 2012-10-30: 1 mg via INTRAVENOUS

## 2012-10-30 MED ORDER — SCOPOLAMINE 1 MG/3DAYS TD PT72
MEDICATED_PATCH | TRANSDERMAL | Status: DC | PRN
  Administered 2012-10-30: 1 via TRANSDERMAL

## 2012-10-30 MED ORDER — METHOCARBAMOL 500 MG PO TABS: 500.0000 mg | ORAL_TABLET | Freq: Four times a day (QID) | ORAL | Status: DC | PRN

## 2012-10-30 MED ORDER — LACTATED RINGERS IV SOLN: INTRAVENOUS | Status: DC

## 2012-10-30 MED ORDER — TRIPLE ANTIBIOTIC 5-400-5000 EX OINT: 1.0000 "application " | TOPICAL_OINTMENT | Freq: Two times a day (BID) | CUTANEOUS | Status: DC

## 2012-10-30 MED ORDER — LACTATED RINGERS IV SOLN
INTRAVENOUS | Status: DC
  Administered 2012-10-30: 20 mL/h via INTRAVENOUS

## 2012-10-30 MED ORDER — MUPIROCIN 2 % EX OINT
TOPICAL_OINTMENT | CUTANEOUS | Status: AC
  Administered 2012-10-30: 1 via NASAL
  Filled 2012-10-30: qty 22

## 2012-10-30 MED ORDER — HYDROMORPHONE HCL PF 1 MG/ML IJ SOLN
INTRAMUSCULAR | Status: AC
  Filled 2012-10-30: qty 1

## 2012-10-30 MED ORDER — ONDANSETRON HCL 4 MG/2ML IJ SOLN
4.0000 mg | INTRAMUSCULAR | Status: DC | PRN
Start: 2012-10-30 — End: 2012-10-31

## 2012-10-30 MED ORDER — PROMETHAZINE HCL 25 MG/ML IJ SOLN: 6.2500 mg | INTRAMUSCULAR | Status: DC | PRN

## 2012-10-30 MED ORDER — CEFAZOLIN SODIUM 1-5 GM-% IV SOLN
1.0000 g | Freq: Three times a day (TID) | INTRAVENOUS | Status: AC
Start: 2012-10-30 — End: 2012-10-31
  Administered 2012-10-30 – 2012-10-31 (×2): 1 g via INTRAVENOUS
  Filled 2012-10-30 (×2): qty 50

## 2012-10-30 MED ORDER — SODIUM CHLORIDE 0.9 % IJ SOLN: 3.0000 mL | Freq: Two times a day (BID) | INTRAMUSCULAR | Status: DC

## 2012-10-30 MED ORDER — ZOLPIDEM TARTRATE 5 MG PO TABS: 5.0000 mg | ORAL_TABLET | Freq: Every evening | ORAL | Status: DC | PRN

## 2012-10-30 MED ORDER — GLYCOPYRROLATE 0.2 MG/ML IJ SOLN
INTRAMUSCULAR | Status: DC | PRN
  Administered 2012-10-30: 0.6 mg via INTRAVENOUS

## 2012-10-30 MED ORDER — LACTATED RINGERS IV SOLN
INTRAVENOUS | Status: DC | PRN
  Administered 2012-10-30 (×2): via INTRAVENOUS

## 2012-10-30 MED ORDER — EPHEDRINE SULFATE 50 MG/ML IJ SOLN
INTRAMUSCULAR | Status: DC | PRN
  Administered 2012-10-30: 15 mg via INTRAVENOUS

## 2012-10-30 MED ORDER — THROMBIN 20000 UNITS EX SOLR
CUTANEOUS | Status: AC
  Filled 2012-10-30: qty 20000

## 2012-10-30 MED ORDER — ACETAMINOPHEN 10 MG/ML IV SOLN
1000.0000 mg | Freq: Four times a day (QID) | INTRAVENOUS | Status: DC
  Administered 2012-10-30: 1000 mg via INTRAVENOUS

## 2012-10-30 MED ORDER — MENTHOL 3 MG MT LOZG: 1.0000 | LOZENGE | OROMUCOSAL | Status: DC | PRN

## 2012-10-30 MED ORDER — BUPIVACAINE-EPINEPHRINE PF 0.25-1:200000 % IJ SOLN
INTRAMUSCULAR | Status: AC
  Filled 2012-10-30: qty 30

## 2012-10-30 MED ORDER — SURGIFOAM 100 EX MISC
CUTANEOUS | Status: DC | PRN
  Administered 2012-10-30: 14:00:00 via TOPICAL

## 2012-10-30 MED ORDER — MOMETASONE FURO-FORMOTEROL FUM 100-5 MCG/ACT IN AERO
2.0000 | INHALATION_SPRAY | Freq: Two times a day (BID) | RESPIRATORY_TRACT | Status: DC
  Administered 2012-10-31: 2 via RESPIRATORY_TRACT
  Filled 2012-10-30: qty 8.8

## 2012-10-30 MED ORDER — BACITRACIN-NEOMYCIN-POLYMYXIN OINTMENT TUBE
TOPICAL_OINTMENT | Freq: Two times a day (BID) | CUTANEOUS | Status: DC
  Administered 2012-10-30: 23:00:00 via TOPICAL
  Filled 2012-10-30: qty 15

## 2012-10-30 MED ORDER — HYDROMORPHONE HCL PF 1 MG/ML IJ SOLN
0.2500 mg | INTRAMUSCULAR | Status: DC | PRN
  Administered 2012-10-30: 0.5 mg via INTRAVENOUS

## 2012-10-30 MED ORDER — OXYCODONE HCL 5 MG/5ML PO SOLN: 5.0000 mg | Freq: Once | ORAL | Status: DC | PRN

## 2012-10-30 MED ORDER — OXYCODONE HCL 5 MG PO TABS
10.0000 mg | ORAL_TABLET | ORAL | Status: DC | PRN
  Administered 2012-10-30 – 2012-10-31 (×3): 10 mg via ORAL
  Filled 2012-10-30 (×3): qty 2

## 2012-10-30 MED ORDER — BUPROPION HCL ER (SR) 150 MG PO TB12
150.0000 mg | ORAL_TABLET | Freq: Two times a day (BID) | ORAL | Status: DC
  Administered 2012-10-30 – 2012-10-31 (×2): 150 mg via ORAL
  Filled 2012-10-30 (×3): qty 1

## 2012-10-30 MED ORDER — SODIUM CHLORIDE 0.9 % IV SOLN: 250.0000 mL | INTRAVENOUS | Status: DC

## 2012-10-30 MED ORDER — FENTANYL CITRATE 0.05 MG/ML IJ SOLN
INTRAMUSCULAR | Status: DC | PRN
  Administered 2012-10-30 (×2): 50 ug via INTRAVENOUS
  Administered 2012-10-30: 200 ug via INTRAVENOUS
  Administered 2012-10-30: 50 ug via INTRAVENOUS

## 2012-10-30 MED ORDER — PHENOL 1.4 % MT LIQD
1.0000 | OROMUCOSAL | Status: DC | PRN
  Administered 2012-10-30: 1 via OROMUCOSAL

## 2012-10-30 MED ORDER — PROPOFOL 10 MG/ML IV BOLUS
INTRAVENOUS | Status: DC | PRN
  Administered 2012-10-30: 200 mg via INTRAVENOUS

## 2012-10-30 MED ORDER — SODIUM CHLORIDE 0.9 % IJ SOLN: 3.0000 mL | INTRAMUSCULAR | Status: DC | PRN

## 2012-10-30 MED ORDER — NEOSTIGMINE METHYLSULFATE 1 MG/ML IJ SOLN
INTRAMUSCULAR | Status: DC | PRN
  Administered 2012-10-30: 3 mg via INTRAVENOUS

## 2012-10-30 MED ORDER — LIDOCAINE HCL (CARDIAC) 20 MG/ML IV SOLN
INTRAVENOUS | Status: DC | PRN
  Administered 2012-10-30: 40 mg via INTRAVENOUS

## 2012-10-30 MED ORDER — OXYCODONE HCL 5 MG PO TABS: 5.0000 mg | ORAL_TABLET | Freq: Once | ORAL | Status: DC | PRN

## 2012-10-30 MED ORDER — MEPERIDINE HCL 25 MG/ML IJ SOLN: 6.2500 mg | INTRAMUSCULAR | Status: DC | PRN

## 2012-10-30 MED ORDER — 0.9 % SODIUM CHLORIDE (POUR BTL) OPTIME
TOPICAL | Status: DC | PRN
  Administered 2012-10-30: 1000 mL

## 2012-10-30 MED ORDER — LUBIPROSTONE 8 MCG PO CAPS
8.0000 ug | ORAL_CAPSULE | Freq: Two times a day (BID) | ORAL | Status: DC
  Administered 2012-10-30 – 2012-10-31 (×2): 8 ug via ORAL
  Filled 2012-10-30 (×4): qty 1

## 2012-10-30 MED ORDER — DEXTROSE 5 % IV SOLN
500.0000 mg | Freq: Four times a day (QID) | INTRAVENOUS | Status: DC | PRN
  Filled 2012-10-30: qty 5

## 2012-10-30 MED ORDER — MIDAZOLAM HCL 2 MG/2ML IJ SOLN
INTRAMUSCULAR | Status: AC
  Filled 2012-10-30: qty 2

## 2012-10-30 MED ORDER — ACETAMINOPHEN 10 MG/ML IV SOLN
1000.0000 mg | Freq: Four times a day (QID) | INTRAVENOUS | Status: DC
  Administered 2012-10-30 – 2012-10-31 (×3): 1000 mg via INTRAVENOUS
  Filled 2012-10-30 (×4): qty 100

## 2012-10-30 MED ORDER — METOPROLOL SUCCINATE ER 50 MG PO TB24
50.0000 mg | ORAL_TABLET | Freq: Every day | ORAL | Status: DC
Start: 2012-10-31 — End: 2012-10-31
  Administered 2012-10-31: 50 mg via ORAL
  Filled 2012-10-30 (×2): qty 1

## 2012-10-30 MED ORDER — ROCURONIUM BROMIDE 100 MG/10ML IV SOLN
INTRAVENOUS | Status: DC | PRN
  Administered 2012-10-30: 50 mg via INTRAVENOUS

## 2012-10-30 MED ORDER — AMLODIPINE BESYLATE 5 MG PO TABS
5.0000 mg | ORAL_TABLET | Freq: Every day | ORAL | Status: DC
  Administered 2012-10-31: 5 mg via ORAL
  Filled 2012-10-30: qty 1

## 2012-10-30 MED ORDER — ALPRAZOLAM 0.5 MG PO TABS
1.0000 mg | ORAL_TABLET | Freq: Two times a day (BID) | ORAL | Status: DC | PRN
  Administered 2012-10-30: 2 mg via ORAL
  Filled 2012-10-30 (×2): qty 2

## 2012-10-30 MED ORDER — ALBUTEROL SULFATE HFA 108 (90 BASE) MCG/ACT IN AERS
2.0000 | INHALATION_SPRAY | Freq: Four times a day (QID) | RESPIRATORY_TRACT | Status: DC | PRN
  Filled 2012-10-30: qty 6.7

## 2012-10-30 MED ORDER — PHENYLEPHRINE HCL 10 MG/ML IJ SOLN
INTRAMUSCULAR | Status: DC | PRN
  Administered 2012-10-30: 80 ug via INTRAVENOUS
  Administered 2012-10-30: 160 ug via INTRAVENOUS

## 2012-10-30 SURGICAL SUPPLY — 59 items
APL SKNCLS STERI-STRIP NONHPOA (GAUZE/BANDAGES/DRESSINGS)
BENZOIN TINCTURE PRP APPL 2/3 (GAUZE/BANDAGES/DRESSINGS) IMPLANT
BIT DRILL SKYLINE 12MM (BIT) IMPLANT
BLADE SURG ROTATE 9660 (MISCELLANEOUS) IMPLANT
BUR EGG ELITE 4.0 (BURR) IMPLANT
BUR MATCHSTICK NEURO 3.0 LAGG (BURR) IMPLANT
CANISTER SUCTION 2500CC (MISCELLANEOUS) ×2 IMPLANT
CLOTH BEACON ORANGE TIMEOUT ST (SAFETY) ×2 IMPLANT
CLSR STERI-STRIP ANTIMIC 1/2X4 (GAUZE/BANDAGES/DRESSINGS) ×2 IMPLANT
CORDS BIPOLAR (ELECTRODE) ×2 IMPLANT
COVER SURGICAL LIGHT HANDLE (MISCELLANEOUS) ×4 IMPLANT
CRADLE DONUT ADULT HEAD (MISCELLANEOUS) ×2 IMPLANT
DRAPE C-ARM 42X72 X-RAY (DRAPES) ×2 IMPLANT
DRAPE POUCH INSTRU U-SHP 10X18 (DRAPES) ×2 IMPLANT
DRAPE SURG 17X23 STRL (DRAPES) ×2 IMPLANT
DRAPE U-SHAPE 47X51 STRL (DRAPES) ×4 IMPLANT
DRILL BIT SKYLINE 12MM (BIT) ×2
DRSG MEPILEX BORDER 4X4 (GAUZE/BANDAGES/DRESSINGS) ×2 IMPLANT
ELECT COATED BLADE 2.86 ST (ELECTRODE) ×2 IMPLANT
ELECT REM PT RETURN 9FT ADLT (ELECTROSURGICAL) ×2
ELECTRODE REM PT RTRN 9FT ADLT (ELECTROSURGICAL) ×1 IMPLANT
GLOVE BIOGEL PI IND STRL 8.5 (GLOVE) ×1 IMPLANT
GLOVE BIOGEL PI INDICATOR 8.5 (GLOVE) ×2
GLOVE ECLIPSE 8.5 STRL (GLOVE) ×4 IMPLANT
GLOVE SURG ORTHO 8.5 STRL (GLOVE) ×1 IMPLANT
GOWN PREVENTION PLUS XXLARGE (GOWN DISPOSABLE) ×2 IMPLANT
GOWN STRL REIN XL XLG (GOWN DISPOSABLE) ×4 IMPLANT
INTERLOCK LRDTC CRVCL VBR 8MM (Peek) IMPLANT
KIT BASIN OR (CUSTOM PROCEDURE TRAY) ×2 IMPLANT
KIT ROOM TURNOVER OR (KITS) ×2 IMPLANT
LORDOTIC CERVICAL VBR 8MM SM (Peek) ×2 IMPLANT
NDL SPNL 18GX3.5 QUINCKE PK (NEEDLE) ×1 IMPLANT
NEEDLE SPNL 18GX3.5 QUINCKE PK (NEEDLE) ×2 IMPLANT
NS IRRIG 1000ML POUR BTL (IV SOLUTION) ×2 IMPLANT
PACK ORTHO CERVICAL (CUSTOM PROCEDURE TRAY) ×2 IMPLANT
PACK UNIVERSAL I (CUSTOM PROCEDURE TRAY) ×2 IMPLANT
PAD ARMBOARD 7.5X6 YLW CONV (MISCELLANEOUS) ×4 IMPLANT
PATTIES SURGICAL .25X.25 (GAUZE/BANDAGES/DRESSINGS) IMPLANT
PATTIES SURGICAL .5 X.5 (GAUZE/BANDAGES/DRESSINGS) IMPLANT
PIN DISTRACTION 14 (PIN) ×2 IMPLANT
PLATE SKYLINE 12MM (Plate) ×1 IMPLANT
PUTTY BONE DBX 2.5 MIS (Bone Implant) ×1 IMPLANT
RESTRAINT LIMB HOLDER UNIV (RESTRAINTS) ×2 IMPLANT
SCREW SKYLINE 14MM SD-VA (Screw) ×2 IMPLANT
SCREW SKYLINE VAR OS 14MM (Screw) ×2 IMPLANT
SCRUB PCMX 4 OZ (MISCELLANEOUS) ×1 IMPLANT
SPONGE INTESTINAL PEANUT (DISPOSABLE) ×3 IMPLANT
SPONGE LAP 4X18 X RAY DECT (DISPOSABLE) ×1 IMPLANT
SPONGE SURGIFOAM ABS GEL 100 (HEMOSTASIS) ×2 IMPLANT
SURGIFLO TRUKIT (HEMOSTASIS) IMPLANT
SUT MNCRL AB 3-0 PS2 18 (SUTURE) ×2 IMPLANT
SUT VIC AB 2-0 CT1 36 (SUTURE) ×2 IMPLANT
SYR CONTROL 10ML LL (SYRINGE) ×2 IMPLANT
TAPE CLOTH 4X10 WHT NS (GAUZE/BANDAGES/DRESSINGS) ×2 IMPLANT
TAPE UMBILICAL COTTON 1/8X30 (MISCELLANEOUS) ×4 IMPLANT
TOWEL OR 17X24 6PK STRL BLUE (TOWEL DISPOSABLE) ×2 IMPLANT
TOWEL OR 17X26 10 PK STRL BLUE (TOWEL DISPOSABLE) ×2 IMPLANT
TRAY FOLEY CATH 16FRSI W/METER (SET/KITS/TRAYS/PACK) IMPLANT
WATER STERILE IRR 1000ML POUR (IV SOLUTION) ×2 IMPLANT

## 2012-10-30 NOTE — Brief Op Note (Signed)
10/30/2012  1:55 PM  PATIENT:  [name]  52 y.o. 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  POST-OPERATIVE DIAGNOSIS:  * No post-op diagnosis entered *  PROCEDURE:  Procedure(s): ACDF C5-6, EXPLORATION AND HARDWARE REMOVAL C6-7 (N/A)  SURGEON:  Surgeon(s) and Role:    * Venita Lick, MD - Primary  PHYSICIAN ASSISTANT:   ASSISTANTS: Zonia Kief   ANESTHESIA:   general  EBL:  Total I/O In: 1000 [I.V.:1000] Out: -   BLOOD ADMINISTERED:none  DRAINS: none   LOCAL MEDICATIONS USED:  MARCAINE     SPECIMEN:  No Specimen  DISPOSITION OF SPECIMEN:  N/A  COUNTS:  YES  TOURNIQUET:  * No tourniquets in log *  DICTATION: .Other Dictation: Dictation Number [id number]  PLAN OF CARE: Admit for overnight observation  PATIENT DISPOSITION:  PACU - hemodynamically stable.

## 2012-10-30 NOTE — H&P (Signed)
No change in clinical exam H+P reviewed  

## 2012-10-30 NOTE — Preoperative (Signed)
Beta Blockers   Reason not to administer Beta Blockers:Not Applicable, Pt took Metoprolol this am 

## 2012-10-30 NOTE — Anesthesia Preprocedure Evaluation (Addendum)
Anesthesia Evaluation  Patient identified by MRN, date of birth, ID band Patient awake    Reviewed: Allergy & Precautions, H&P , NPO status , Patient's Chart, lab work & pertinent test results  History of Anesthesia Complications (+) PONV  Airway Mallampati: II TM Distance: >3 FB Neck ROM: Full    Dental  (+) Teeth Intact, Caps and Dental Advisory Given   Pulmonary asthma , Sleep apnea: tested negative for OSA. ,  Problems with Asthma a few times a year with colds. breath sounds clear to auscultation  Pulmonary exam normal       Cardiovascular hypertension, Pt. on medications and Pt. on home beta blockers Rhythm:Regular Rate:Normal     Neuro/Psych  Headaches, PSYCHIATRIC DISORDERS Anxiety Depression Chronic back pain: narcotics    GI/Hepatic Neg liver ROS, GERD-  Controlled,  Endo/Other  negative endocrine ROS  Renal/GU negative Renal ROS     Musculoskeletal   Abdominal   Peds  Hematology negative hematology ROS (+)   Anesthesia Other Findings   Reproductive/Obstetrics                         Anesthesia Physical Anesthesia Plan  ASA: II  Anesthesia Plan: General   Post-op Pain Management:    Induction: Intravenous  Airway Management Planned: Oral ETT  Additional Equipment:   Intra-op Plan:   Post-operative Plan: Extubation in OR  Informed Consent: I have reviewed the patients History and Physical, chart, labs and discussed the procedure including the risks, benefits and alternatives for the proposed anesthesia with the patient or authorized representative who has indicated his/her understanding and acceptance.   Dental advisory given  Plan Discussed with: Surgeon, CRNA and Anesthesiologist  Anesthesia Plan Comments: (Plan routine monitors, GETA)       Anesthesia Quick Evaluation

## 2012-10-30 NOTE — Anesthesia Procedure Notes (Signed)
Procedure Name: Intubation Date/Time: 10/30/2012 12:03 PM Performed by: Rogelia Boga Pre-anesthesia Checklist: Patient identified, Emergency Drugs available, Suction available, Patient being monitored and Timeout performed Patient Re-evaluated:Patient Re-evaluated prior to inductionOxygen Delivery Method: Circle system utilized Preoxygenation: Pre-oxygenation with 100% oxygen Intubation Type: IV induction Ventilation: Mask ventilation without difficulty Laryngoscope Size: Mac and 3 Grade View: Grade II Tube type: Oral Tube size: 7.0 mm Number of attempts: 1 Airway Equipment and Method: Stylet Placement Confirmation: ETT inserted through vocal cords under direct vision,  positive ETCO2 and breath sounds checked- equal and bilateral Secured at: 21 cm Tube secured with: Tape Dental Injury: Teeth and Oropharynx as per pre-operative assessment

## 2012-10-30 NOTE — Transfer of Care (Signed)
Immediate Anesthesia Transfer of Care Note  Patient: [name]  Procedure(s) Performed: Procedure(s): ACDF C5-6, EXPLORATION AND HARDWARE REMOVAL C6-7 (N/A)  Patient Location: PACU  Anesthesia Type:General  Level of Consciousness: awake, alert , oriented and patient cooperative  Airway & Oxygen Therapy: Patient Spontanous Breathing and Patient connected to nasal cannula oxygen  Post-op Assessment: Report given to PACU RN, Post -op Vital signs reviewed and stable and Patient moving all extremities X 4  Post vital signs: Reviewed and stable  Complications: No apparent anesthesia complications

## 2012-10-30 NOTE — Anesthesia Postprocedure Evaluation (Signed)
Anesthesia Post Note  Patient: [name]  Procedure(s) Performed: Procedure(s) (LRB): ACDF C5-6, EXPLORATION AND HARDWARE REMOVAL C6-7 (N/A)  Anesthesia type: general  Patient location: PACU  Post pain: Pain level controlled  Post assessment: Patient's Cardiovascular Status Stable  Last Vitals:  Filed Vitals:   10/30/12 1500  BP: 119/78  Pulse: 61  Temp:   Resp: 12    Post vital signs: Reviewed and stable  Level of consciousness: sedated  Complications: No apparent anesthesia complications

## 2012-10-31 ENCOUNTER — Encounter (HOSPITAL_COMMUNITY): Payer: Self-pay | Admitting: Orthopedic Surgery

## 2012-10-31 MED ORDER — DOCUSATE SODIUM 100 MG PO CAPS: 100.0000 mg | ORAL_CAPSULE | Freq: Three times a day (TID) | ORAL | Status: DC | PRN

## 2012-10-31 MED ORDER — METHOCARBAMOL 500 MG PO TABS: 500.0000 mg | ORAL_TABLET | Freq: Three times a day (TID) | ORAL | Status: DC | PRN

## 2012-10-31 MED ORDER — OXYCODONE-ACETAMINOPHEN 10-325 MG PO TABS: 1.0000 | ORAL_TABLET | ORAL | Status: DC | PRN

## 2012-10-31 MED ORDER — ONDANSETRON HCL 4 MG PO TABS: 4.0000 mg | ORAL_TABLET | Freq: Three times a day (TID) | ORAL | Status: DC | PRN

## 2012-10-31 MED ORDER — POLYETHYLENE GLYCOL 3350 17 GM/SCOOP PO POWD: 17.0000 g | Freq: Every day | ORAL | Status: DC

## 2012-10-31 NOTE — Evaluation (Signed)
Physical Therapy Evaluation Patient Details Name: [name] MRN: [id number] DOB: October 14, 1960 Today's Date: 10/31/2012 Time: 4098-1191 PT Time Calculation (min): 17 min  PT Assessment / Plan / Recommendation History of Present Illness  s/p ACDF and hardware removal c6-c7  Clinical Impression  Patient is s/p ACDF C5-C6, exploration and hardware removal C6-C7 surgery. Pt is transferring and amb at supervision to mod I assist level. Pt has had previous cervical surgeries and all equipment is in place. Pt encouraged to amb with AD upon D/C home to decrease risk of falls, pt agreeable. No acute PT needs at this time.      PT Assessment  Patent does not need any further PT services;All further PT needs can be met in the next venue of care    Follow Up Recommendations  No PT follow up;Supervision - Intermittent    Does the patient have the potential to tolerate intense rehabilitation      Barriers to Discharge        Equipment Recommendations  None recommended by PT    Recommendations for Other Services     Frequency      Precautions / Restrictions Precautions Precautions: Cervical Precaution Comments: pt has Rt foot drop and has AFO that she amb with  Required Braces or Orthoses: Cervical Brace Cervical Brace: Hard collar;At all times Restrictions Weight Bearing Restrictions: No   Pertinent Vitals/Pain 2/10       Mobility  Bed Mobility Bed Mobility: Supine to Sit;Sitting - Scoot to Edge of Bed Supine to Sit: 5: Supervision;HOB flat Sitting - Scoot to Edge of Bed: 6: Modified independent (Device/Increase time) Details for Bed Mobility Assistance: supervision for min cues to maintain cervical neck precautions; no physical (A) needed; pt able to come to long sit then transfer to EOB Transfers Transfers: Sit to Stand;Stand to Sit Sit to Stand: 5: Supervision;From bed Stand to Sit: 5: Supervision;To bed Details for Transfer Assistance: pt demo posterior LOB but was  able to recover independently; supervision for safety and cues to slow down for safety  Ambulation/Gait Ambulation/Gait Assistance: 4: Min guard Ambulation Distance (Feet): 200 Feet Assistive device: 1 person hand held assist Ambulation/Gait Assistance Details: pt unsteady with gt minimally; pt would benefit from AD to increase stability; with IV pole supporting Lt UE pt required only supervison for safety; pt agreeable to amb with her RW upon D/C home Gait Pattern: Step-through pattern Gait velocity: decreased  Stairs: Yes Stairs Assistance: 5: Supervision Stairs Assistance Details (indicate cue type and reason): min cues for safety and sequencing Stair Management Technique: One rail Left;Step to pattern;Forwards Number of Stairs: 2 Wheelchair Mobility Wheelchair Mobility: No         PT Diagnosis: Acute pain  PT Problem List: Decreased balance;Decreased mobility;Pain PT Treatment Interventions:       PT Goals(Current goals can be found in the care plan section) Acute Rehab PT Goals Patient Stated Goal: to go home and not come back PT Goal Formulation: No goals set, d/c therapy  Visit Information  Last PT Received On: 10/31/12 Assistance Needed: +1 History of Present Illness: s/p ACDF       Prior Functioning  Home Living Family/patient expects to be discharged to:: Private residence Living Arrangements: Spouse/significant other;Other relatives Available Help at Discharge: Family;Friend(s);Available 24 hours/day Type of Home: House Home Access: Stairs to enter [employer] of Steps: 5 Entrance Stairs-Rails: Right;Left Home Layout: Two level;Able to live on main level with bedroom/bathroom Home Equipment: Dan Humphreys - 2 wheels;Cane - single  point;Bedside commode;Shower seat Prior Function Level of Independence: Independent Comments: Pt states she was not using AD prior to surgery  Communication Communication: No difficulties    Cognition   Cognition Arousal/Alertness: Awake/alert Behavior During Therapy: WFL for tasks assessed/performed Overall Cognitive Status: Within Functional Limits for tasks assessed    Extremity/Trunk Assessment Upper Extremity Assessment Upper Extremity Assessment: Defer to OT evaluation Lower Extremity Assessment Lower Extremity Assessment: Overall WFL for tasks assessed Cervical / Trunk Assessment Cervical / Trunk Assessment: Normal   Balance Balance Balance Assessed: Yes Static Sitting Balance Static Sitting - Balance Support: No upper extremity supported;Feet supported Static Sitting - Level of Assistance: 6: Modified independent (Device/Increase time)  End of Session PT - End of Session Equipment Utilized During Treatment: Cervical collar Activity Tolerance: Patient tolerated treatment well  GP Functional Assessment Tool Used: clinical judgement Functional Limitation: Mobility: Walking and moving around Mobility: Walking and Moving Around Current Status (G4010): At least 1 percent but less than 20 percent impaired, limited or restricted Mobility: Walking and Moving Around Goal Status [phone]): At least 1 percent but less than 20 percent impaired, limited or restricted Mobility: Walking and Moving Around Discharge Status [phone]): At least 1 percent but less than 20 percent impaired, limited or restricted   Donell Sievert,  [phone] 10/31/2012, 9:02 AM

## 2012-10-31 NOTE — Progress Notes (Signed)
    Subjective: Procedure(s) (LRB): ACDF C5-6, EXPLORATION AND HARDWARE REMOVAL C6-7 (N/A) 1 Day Post-Op  Patient reports pain as 2 on 0-10 scale.  Reports decreased arm pain reports incisional neck pain   Positive void Negative bowel movement Positive flatus Negative chest pain or shortness of breath  Objective: Vital signs in last 24 hours: Temp:  [97 F (36.1 C)-98.7 F (37.1 C)] 98.7 F (37.1 C) (08/21 0555) Pulse Rate:  [58-89] 89 (08/21 0555) Resp:  [5-21] 18 (08/21 0555) BP: (104-121)/(64-80) 108/71 mmHg (08/21 0555) SpO2:  [100 %] 100 % (08/21 0555)  Intake/Output from previous day: 08/20 0701 - 08/21 0700 In: 1500 [I.V.:1500] Out: -   Labs: No results found for this basename: WBC, RBC, HCT, PLT,  in the last 72 hours No results found for this basename: NA, K, CL, CO2, BUN, CREATININE, GLUCOSE, CALCIUM,  in the last 72 hours No results found for this basename: LABPT, INR,  in the last 72 hours  Physical Exam: Neurologically intact ABD soft Neurovascular intact Intact pulses distally Incision: dressing C/D/I and no drainage Compartment soft  Assessment/Plan: Patient stable  xrays satisfactory Mobilization with physical therapy Encourage incentive spirometry Continue care  Advance diet Up with therapy D/C IV fluids Doing well.  Plan for d/c today  Venita Lick, MD [facility] [phone]

## 2012-10-31 NOTE — Discharge Summary (Signed)
Patient ID: [name] MRN: [id number] DOB/AGE: 11-26-1960 52 y.o.  Admit date: 10/30/2012 Discharge date: 10/31/2012  Admission Diagnoses:  Active Problems:   * No active hospital problems. *   Discharge Diagnoses:  Active Problems:   * No active hospital problems. *  status post Procedure(s): ACDF C5-6, EXPLORATION AND HARDWARE REMOVAL C6-7  Past Medical History  Diagnosis Date  . Headache(784.0)     AND NECK PAIN--STATES RECENT TEST SHOW CERVICAL DEGENERATION  . PONV (postoperative nausea and vomiting)     PT GIVES HX OF N&V AND FEVER WITH SURGERIES YEARS AGO--BUT NO PROBLEMS WITH MORE RECENT SURGERIES--STATES NOT MALIGNANT HYPERTHERMIA  . 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 Hypertension   . Asthma     INHALERS WITH FLARE -UPS  . GERD (gastroesophageal reflux disease)   . DDD (degenerative disc disease)     CERVICAL AND LUMBAR-CHRONIC PAIN, RT HIP LABRAL TEAR  . Pain     CHRONIC NECK AND BACK PAIN - LIMITED ROM NECK - S/P FUSIONS CERVICAL AND LUMBAR  . Anemia     Iron Infusion-8 yrs ago  . History of blood transfusion     s/p back surgery  . Inverted T wave   . Pneumonia   . Sleep apnea     hx of  . History of kidney stones   . Right foot drop   . Anxiety   . Depression     PT STATES A LOT OF STRESS IN HER LIFE    Surgeries: Procedure(s): ACDF C5-6, EXPLORATION AND HARDWARE REMOVAL C6-7 on 10/30/2012   Consultants:    Discharged Condition: Improved  Hospital Course: [name] is an 52 y.o. female who was admitted 10/30/2012 for operative treatment of <principal problem not specified>. Patient failed conservative treatments (please see the history and physical for the specifics) and had severe unremitting pain that affects sleep, daily activities and work/hobbies. After pre-op clearance, the patient was taken to the operating room on 10/30/2012 and  underwent  Procedure(s): ACDF C5-6, EXPLORATION AND HARDWARE REMOVAL C6-7.    Patient was given perioperative antibiotics: Anti-infectives   Start     Dose/Rate Route Frequency Ordered Stop   10/30/12 1900  ceFAZolin (ANCEF) IVPB 1 g/50 mL premix     1 g 100 mL/hr over 30 Minutes Intravenous Every 8 hours 10/30/12 1648 10/31/12 0300   10/29/12 1415  ceFAZolin (ANCEF) IVPB 2 g/50 mL premix     2 g 100 mL/hr over 30 Minutes Intravenous 30 min pre-op 10/29/12 1415 10/30/12 1210       Patient was given sequential compression devices and early ambulation to prevent DVT.   Patient benefited maximally from hospital stay and there were no complications. At the time of discharge, the patient was urinating/moving their bowels without difficulty, tolerating a regular diet, pain is controlled with oral pain medications and they have been cleared by PT/OT.   Recent vital signs: Patient Vitals for the past 24 hrs:  BP Temp Temp src Pulse Resp SpO2  10/31/12 0555 108/71 mmHg 98.7 F (37.1 C) Oral 89 18 100 %  10/31/12 0100 104/64 mmHg 98.4 F (36.9 C) Oral 67 18 100 %  10/30/12 2121 120/80 mmHg 98.1 F (36.7 C) - 61 18 100 %  10/30/12 1540 117/70 mmHg 97.7 F (36.5 C) - 59 - 100 %  10/30/12 1515 113/79  mmHg 97.8 F (36.6 C) - 58 5 100 %  10/30/12 1500 119/78 mmHg - - 61 12 100 %  10/30/12 1445 118/78 mmHg - - 62 21 100 %  10/30/12 1430 121/77 mmHg 98 F (36.7 C) - 68 11 100 %  10/30/12 0934 117/79 mmHg 97 F (36.1 C) Oral 67 20 100 %     Recent laboratory studies: No results found for this basename: WBC, HGB, HCT, PLT, NA, K, CL, CO2, BUN, CREATININE, GLUCOSE, PT, INR, CALCIUM, 2,  in the last 72 hours   Discharge Medications:     Medication List    ASK your doctor about these medications       albuterol 108 (90 BASE) MCG/ACT inhaler  Commonly known as:  PROVENTIL HFA;VENTOLIN HFA  Inhale 2 puffs into the lungs every 6 (six) hours as needed for wheezing.     ALPRAZolam 1 MG tablet   Commonly known as:  XANAX  Take 1-2 mg by mouth 2 (two) times daily as needed for anxiety.     amLODipine 5 MG tablet  Commonly known as:  NORVASC  Take 5 mg by mouth daily.     buPROPion 150 MG 12 hr tablet  Commonly known as:  WELLBUTRIN SR  Take 150 mg by mouth 2 (two) times daily.     Fluticasone-Salmeterol 250-50 MCG/DOSE Aepb  Commonly known as:  ADVAIR  Inhale 1 puff into the lungs every 12 (twelve) hours.     lubiprostone 8 MCG capsule  Commonly known as:  AMITIZA  Take 8 mcg by mouth 2 (two) times daily with a meal.     metoprolol succinate 50 MG 24 hr tablet  Commonly known as:  TOPROL-XL  Take 50 mg by mouth daily before breakfast. Take with or immediately following a meal.     neomycin-bacitracin-polymyxin 5-(980) 010-1299 ointment  Apply 1 application topically 2 (two) times daily as needed (for nostil pain).     oxyCODONE-acetaminophen 10-325 MG per tablet  Commonly known as:  PERCOCET  Take 1 tablet by mouth every 4 (four) hours as needed for pain.        Diagnostic Studies: Dg Cervical Spine 2 Or 3 Views  10/30/2012   CLINICAL DATA:  52 year old female status post cervical fusion.  EXAM: CERVICAL SPINE - 2-3 VIEW  COMPARISON:  Intraoperative radiographs 1341 hr the same day.  FINDINGS: Portable AP and cross-table lateral views of the cervical spine at 1445 hrs. C5-C6 ACDF with metallic interbody implant. C6 hardware better visualized on the intraoperative views. Hardware appears stable and intact. Postoperative gas in the right neck.  IMPRESSION: C5-C6 ACDF. C6 level hardware better visualized on intraoperative fluoroscopy. No adverse features identified.   Electronically Signed   By: Augusto Gamble   On: 10/30/2012 15:00   Dg Cervical Spine 2-3 Views  10/30/2012   *RADIOLOGY REPORT*  Clinical Data: Cervical fusion  DG C-ARM 1-60 MIN,CERVICAL SPINE - 2-3 VIEW  Comparison: Earlier films of the same day  Findings: Two intraoperative C-arm fluoroscopic spot images document  changes of instrumented ACDF C5-6.  Anterior hardware and interbody cage project in expected location.  The previously seen hardware C6-7 has been removed.  IMPRESSION:  1.  ACDF C5-6   Original Report Authenticated By: D. Andria Rhein, MD   Dg Cervical Spine 2 Or 3 Views  10/30/2012   CLINICAL DATA:  52 year old female preoperative study. History of cervical fusion.  EXAM: CERVICAL SPINE - 2-3 VIEW  COMPARISON:  Cervical MRI 08/21/2011  and earlier.  FINDINGS: ACDF re- identified at C6-C7 with radiographic evidence of solid interbody arthrodesis. Hardware appears stable and intact. Disc space loss and endplate spurring at C5-C6. Trace anterolisthesis at C7-T1. These findings appear stable since 2013. Prevertebral soft tissue contour within normal limits. Lung apices are clear. Bone mineralization is within normal limits.  IMPRESSION: Stable postoperative appearance of the cervical spine. C6-C7 ACDF with evidence of solid interbody arthrodesis.   Electronically Signed   By: Augusto Gamble   On: 10/30/2012 10:10   Ct Lumbar Spine W Contrast  10/08/2012   *RADIOLOGY REPORT*  Clinical Data:  Back pain  CT MYELOGRAPHY LUMBAR SPINE  Technique:  CT imaging of the lumbar spine was performed after intrathecal contrast administration.  Multiplanar CT image reconstructions were also generated.  Comparison:  01/10/2008  Findings: Radiologist injection.  Anatomic alignment of the vertebral bodies.  Bilateral pedicle screws are in place extending from L4 into the thoracic spine with cross stabilizing bars.  Solid bony callus along the posterior elements is noted.  Disc spacers are in place at L2-3 and L3-4 with solid trans disc space effusion.  No breakage or loosening of the hardware.  The conus medullaris terminates at the L1-2 disc.  No new vertebral compression deformity.  Wedging at L3 is stable.  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  L1-2:  Fused level.  Central canal and foramina are  widely patent.  L2-3:  Fused level.  Central canal and foramina are widely patent.  L3-4.  Fused level.  Central canal and foramina are widely patent.  L4-5:  Mild facet arthropathy and ligamentum flavum hypertrophy. Mild concentric disc bulge.  No significant central or lateral recess narrowing foramina patent.  L5 S1:  Shallow central protrusion.  No lateral recess or central canal narrowing.  Patent foramina.  Very minimal facet arthropathy.  IMPRESSION: Solid thoracolumbar fusion through L4.  Mild degenerative disc disease at L4-5 and L5 S1 without evidence of impingement.  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   Original Report Authenticated By: Jolaine Click, M.D.   Dg Myelogram Lumbar  10/08/2012   *RADIOLOGY REPORT*  Clinical Data:  Back pain  MYELOGRAM LUMBAR  Technique: The procedure, risks, benefits, and alternatives were explained to the patient. The patient understands and consents. Under fluoroscopic guidance, a 22 gauge spinal needle was placed in the CSF space via left L5-S1 approach. 20 mL of Omnipaque 180 was injected.  Findings: Bilateral pedicle screws and cross stabilizing bars are present in L4, and L3, L2, L1, T12, T11, and above the upper limit of the study.  There is no breakage or loosening of the hardware. There is also anatomic alignment.   Mild wedging at L3 is stable compared with March 21, 2007.  Disc spacers are in place in the interposed   levels at L2-3 and L3-4.  There is very minimal anterior epidural mass effect at L4-5.  No mass effect at L5-S1.  The central canal is widely patent throughout the lumbar spine.  Flexion and extension views demonstrate no evidence of abnormal motion to suggest instability. There is very limited range of motion in the lumbar spine.  Complications: None.  Fluoroscopy Time: 1 minute and 8 seconds.  IMPRESSION: Thoracolumbar fusion as described.  No evidence of spinal stenosis or hardware failure.   Original Report Authenticated By: Jolaine Click, M.D.   Dg C-arm 1-60 Min  10/30/2012   *RADIOLOGY REPORT*  Clinical Data: Cervical fusion  DG C-ARM 1-60 MIN,CERVICAL SPINE - 2-3 VIEW  Comparison: Earlier films of the same day  Findings: Two intraoperative C-arm fluoroscopic spot images document changes of instrumented ACDF C5-6.  Anterior hardware and interbody cage project in expected location.  The previously seen hardware C6-7 has been removed.  IMPRESSION:  1.  ACDF C5-6   Original Report Authenticated By: D. Andria Rhein, MD          Follow-up Information   Follow up with Alvy Beal, MD. Schedule an appointment as soon as possible for a visit in 2 weeks.   Specialty:  Orthopedic Surgery   Contact information:   [address] [place] [address] [phone]       Discharge Plan:  discharge to home  Disposition: stable    Signed: Venita Lick D for Dr. Venita Lick [facility] [phone] 10/31/2012, 7:31 AM

## 2012-10-31 NOTE — Progress Notes (Signed)
UR COMPLETED  

## 2012-10-31 NOTE — Progress Notes (Signed)
SW received a consult for possible placement. PT  At this time patient is not needing SNF placement. CSW will make CM aware. Clinical Social Worker will sign off for now as social work intervention is no longer needed. Please consult us again if new need arises.   Caprice Wasko, MSW [phone] 

## 2012-10-31 NOTE — Op Note (Signed)
NAMETAKARA, SERMONS             ACCOUNT NO.:  [id number]  MEDICAL RECORD NO.:  [id number]  LOCATION:  [id number]                        FACILITY:  [facility]  PHYSICIAN:  Alvy Beal, MD    DATE OF BIRTH:  [birth year]  DATE OF PROCEDURE:  10/30/2012 DATE OF DISCHARGE:                              OPERATIVE REPORT   PREOPERATIVE DIAGNOSIS:  Adjacent segment cervical disk disease, C5-6.  POSTOPERATIVE DIAGNOSIS:  Adjacent segment cervical disk disease, C5-6.  OPERATIVE PROCEDURES: 1. Removal of hardware from cervical spine, C6-7 plate. 2. Exploration of C6-7 previous fusion. 3. ACDF, C5-C6.  INSTRUMENTATION SYSTEM REMOVED:  Synthes anterior cervical Vectra plate, size 14 with 14-mm locking screws.  Instrumentation implanted at C5-6 was a Titan size 8 small lordotic cage, packed with DBX Mix with a 12-mm anterior cervical Skyline DePuy plate affixed with a 12-mm rescue screws into the body of C6 and 14-mm screws into the body of C5.  COMPLICATIONS:  None.  CONDITION:  Stable.  HISTORY:  [name] who had a previous ACDF by me several years ago and was doing well until recently when she started having increasing radicular right arm pain.  MRI demonstrated a right foraminal disk protrusion and the patient improved significantly temporarily with a C6 right nerve block.  As a result of the failure to get permanent relief with conservative measures and her worsening pain, we elected to proceed with the aforementioned surgery.  All appropriate risks, benefits, and alternatives to surgery were discussed and consent was obtained.  FIRST ASSISTANT:  Genene Churn. Denton Meek.  OPERATIVE PROCEDURE:  The patient was brought to the operating room, placed supine on the operating table.  After successful induction of general anesthesia and endotracheal intubation, TEDs and SCDs were applied in unsecured side and the anterior cervical spine was prepped and draped in a standard  fashion.  Trials were placed at the sides, roll towel was placed in the shoulder blades.  Time-out was done to confirm patient, procedure, and all other pertinent important data.  The patient had previous left-sided transverse approach.  Because this was revision, I elected to go to the contralateral side and through a longitudinal approach.  The skin incision was infiltrated with 0.25% Marcaine in standard Smith-Robinson approach.  Through which, longitudinal incision was made.  Incision was made along the medial border of the sternocleidomastoid.  Sharp dissection was carried out down to and through the platysma and then sharply dissected through the deep cervical and prevertebral fascia.  The omohyoid was swept to the left with the trachea and esophagus, and identified the carotid sheath and protected it on the right side with finger.  The anterior cervical spine was palpated and I could palpate the plate.  I then used Kittner dissectors to continue my dissection and removed the remainder of the prevertebral fascia to expose the anterior longitudinal ligament from the midbody of C5 down to the bottom of the C7 plate.  At this point, I then removed the 4 screws.  Retractor was placed to retract the trachea and esophagus, and I removed the plate by removing all four screws and the plate itself.  I then explored the fusion,  it was solid.  There was no evidence of defect in the fusion from C6 to C7.  At this point, I mobilized the longus coli muscles to expose the entire 5-6 disk space anteriorly.  I then placed retractors underneath the longus coli muscle, deflated the endotracheal cuff, expanded the retractors to appropriate width and had good visualization of the 5-6 disk.  I then placed distraction pins into the body of C5 and C6.  Gently distracted the space and then continued my decompression.  An annulotomy was performed with a 15-blade scalpel.  Then using a combination of  pituitary rongeurs, curettes, and Kerrison rongeurs, I removed all of the disk material as well as the overhanging osteophyte from the body of C5.  At this point, I could see the posterior aspect of the ligament, which I released with a fine nerve hook exposing the posterior longitudinal ligament.  I was then able to use a 1-mm Kerrison to resect the osteophyte posteriorly from the bodies of C5 and C6 and get underneath the uncovertebral joint and resected the spur from there. At this point, I was pleased with the decompression.  I then rasped the endplates until I had bleeding subchondral bone.  I trialed with sequential-sized spacers and elected to use the small lordotic Titan cage.  This was obtained, packed with DBX Mix and malleted to the appropriate depth.  The distraction pins were removed and I placed a 12- mm plate and affixed it with self-drilling screws.  All screws had excellent purchase.  Once they were down, I then locked them according to manufacturers standards.  I removed the self-retaining retractors, irrigated it copiously with normal saline and obtained hemostasis using bipolar electrocautery.  I returned the trachea and esophagus to midline.  Final x-rays were taken, which were satisfactory and then the platysma was reapproximated with 2-0 Vicryl sutures and then the skin with 3-0 Monocryl.  Steri-Strips and dry dressing were applied.  The patient was extubated, transferred to the PACU without incident.  At the end of the case, all needle and sponge counts were correct.     Alvy Beal, MD     DDB/MEDQ  D:  10/30/2012  T:  10/31/2012  Job:  [id number]

## 2012-11-01 NOTE — Anesthesia Postprocedure Evaluation (Deleted)
  Anesthesia Post-op Note  Patient: [name]  Procedure(s) Performed: Procedure(s): ACDF C5-6, EXPLORATION AND HARDWARE REMOVAL C6-7 (N/A)  Patient Location: PACU  Anesthesia Type:General  Level of Consciousness: awake, alert , oriented and patient cooperative  Airway and Oxygen Therapy: Patient Spontanous Breathing and Patient connected to nasal cannula oxygen  Post-op Pain: mild  Post-op Assessment: Post-op Vital signs reviewed, Patient's Cardiovascular Status Stable, Respiratory Function Stable, Patent Airway, No signs of Nausea or vomiting and Pain level controlled  Post-op Vital Signs: Reviewed and stable  Complications: No apparent anesthesia complications

## 2012-11-27 ENCOUNTER — Encounter (HOSPITAL_COMMUNITY): Payer: Self-pay | Admitting: Physical Medicine and Rehabilitation

## 2012-11-27 ENCOUNTER — Other Ambulatory Visit (HOSPITAL_COMMUNITY): Payer: Self-pay | Admitting: Orthopedic Surgery

## 2012-11-27 ENCOUNTER — Emergency Department (HOSPITAL_COMMUNITY)
Admission: EM | Admit: 2012-11-27 | Discharge: 2012-11-28 | Disposition: A | Discharge status: Home / Self Care | Payer: Medicaid Other | Attending: Emergency Medicine | Admitting: Emergency Medicine

## 2012-11-27 DIAGNOSIS — I10 Essential (primary) hypertension: Secondary | ICD-10-CM | POA: Insufficient documentation

## 2012-11-27 DIAGNOSIS — Z87442 Personal history of urinary calculi: Secondary | ICD-10-CM | POA: Insufficient documentation

## 2012-11-27 DIAGNOSIS — Q7649 Other congenital malformations of spine, not associated with scoliosis: Secondary | ICD-10-CM

## 2012-11-27 DIAGNOSIS — M542 Cervicalgia: Secondary | ICD-10-CM

## 2012-11-27 DIAGNOSIS — Z79899 Other long term (current) drug therapy: Secondary | ICD-10-CM | POA: Insufficient documentation

## 2012-11-27 DIAGNOSIS — F411 Generalized anxiety disorder: Secondary | ICD-10-CM | POA: Insufficient documentation

## 2012-11-27 DIAGNOSIS — G8918 Other acute postprocedural pain: Secondary | ICD-10-CM | POA: Insufficient documentation

## 2012-11-27 DIAGNOSIS — Z8719 Personal history of other diseases of the digestive system: Secondary | ICD-10-CM | POA: Insufficient documentation

## 2012-11-27 DIAGNOSIS — J45909 Unspecified asthma, uncomplicated: Secondary | ICD-10-CM | POA: Insufficient documentation

## 2012-11-27 DIAGNOSIS — F3289 Other specified depressive episodes: Secondary | ICD-10-CM | POA: Insufficient documentation

## 2012-11-27 DIAGNOSIS — F329 Major depressive disorder, single episode, unspecified: Secondary | ICD-10-CM | POA: Insufficient documentation

## 2012-11-27 DIAGNOSIS — Z8701 Personal history of pneumonia (recurrent): Secondary | ICD-10-CM | POA: Insufficient documentation

## 2012-11-27 DIAGNOSIS — Z862 Personal history of diseases of the blood and blood-forming organs and certain disorders involving the immune mechanism: Secondary | ICD-10-CM | POA: Insufficient documentation

## 2012-11-27 DIAGNOSIS — Z8739 Personal history of other diseases of the musculoskeletal system and connective tissue: Secondary | ICD-10-CM | POA: Insufficient documentation

## 2012-11-27 MED ORDER — HYDROMORPHONE HCL PF 2 MG/ML IJ SOLN
2.0000 mg | Freq: Once | INTRAMUSCULAR | Status: AC
  Administered 2012-11-28: 2 mg via INTRAVENOUS
  Filled 2012-11-27: qty 1

## 2012-11-27 MED ORDER — ONDANSETRON HCL 4 MG/2ML IJ SOLN
4.0000 mg | Freq: Once | INTRAMUSCULAR | Status: AC
  Administered 2012-11-28: 4 mg via INTRAVENOUS
  Filled 2012-11-27: qty 2

## 2012-11-27 NOTE — ED Notes (Signed)
Patient had a neck fusion on 10/30/12, been having neck pain for weeks since surgery, started out as pain in right arm and moving through the hand, "felt numb", Pain now is more in the left hand side of her arm and hand feeling numb. Patient had MRI of Right shoulder on Saturday, MD Shon Baton told patient to come to the ER and get pain under control and possibly have another MRI of Left side performed.  History of degenerative disk disease, has rods in her back and Right total hip arthroplasty

## 2012-11-27 NOTE — ED Notes (Signed)
Family at bedside. 

## 2012-11-27 NOTE — ED Notes (Signed)
Pt presents to department for evaluation of neck pain. States she had spinal fusion x1 month ago by Dr. Shon Baton. Now states pain has increased. Spoke with Dr. Shon Baton today, was told to come to ED for pain management and possible MRI. 9/10 pain upon arrival to ED. Ambulatory without difficulty.

## 2012-11-27 NOTE — ED Provider Notes (Signed)
CSN: [id number]     Arrival date & time 11/27/12  1829 History   First MD Initiated Contact with Patient 11/27/12 2320     Chief Complaint  Patient presents with  . Neck Pain   (Consider location/radiation/quality/duration/timing/severity/associated sxs/prior Treatment) Patient is a 52 y.o. female presenting with neck pain. The history is provided by the patient.  Neck Pain She had a neck surgery one month ago and when she woke up the next morning, she had severe pain in her neck. She attributed it to the surgery but it has not improved. Pain is starting to radiate to her left shoulder. She has been taking oxycodone-acetaminophen 10-325 without relief. She denies any weakness, numbness, tingling. Pain is rated at 10/10. She is in a stiff cervical collar during the daytime but if she moves her neck pain does get worse. Nothing makes it better. She had seen her surgeon who was worried that she might have torticollis causing the pain and got an MRI of her shoulder which did not show an obvious cause of her pain. She was told to come to the ED to achieve pain control and possibly get an MRI of her cervical spine.  Past Medical History  Diagnosis Date  . Headache(784.0)     AND NECK PAIN--STATES RECENT TEST SHOW CERVICAL DEGENERATION  . PONV (postoperative nausea and vomiting)     PT GIVES HX OF N&V AND FEVER WITH SURGERIES YEARS AGO--BUT NO PROBLEMS WITH MORE RECENT SURGERIES--STATES NOT MALIGNANT HYPERTHERMIA  . 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 Hypertension   . Asthma     INHALERS WITH FLARE -UPS  . GERD (gastroesophageal reflux disease)   . DDD (degenerative disc disease)     CERVICAL AND LUMBAR-CHRONIC PAIN, RT HIP LABRAL TEAR  . Pain     CHRONIC NECK AND BACK PAIN - LIMITED ROM NECK - S/P FUSIONS CERVICAL AND LUMBAR  . Anemia     Iron Infusion-8 yrs ago  . History of blood transfusion     s/p  back surgery  . Inverted T wave   . Pneumonia   . Sleep apnea     hx of  . History of kidney stones   . Right foot drop   . Anxiety   . Depression     PT STATES A LOT OF STRESS IN HER LIFE   Past Surgical History  Procedure Laterality Date  . Right hip arthroscopy for labral tear  ABOUT 2010    2012 also  . Anterior fusion cervical spine  MAY 2012    AT [facility]  . Nasal septum surgery  MARCH 2013    IN [place]  . Back surgery  2009    LUMBAR FUSION WITH RODS   . Tonsillectomy      AS A CHILD  . Abdominal hysterectomy    . Diagnostic laparoscopies - multiple for endometriosis    . Cholecystectomy    . Hip arthroscopy  09/20/2011    Procedure: ARTHROSCOPY HIP;  Surgeon: Loanne Drilling, MD;  Location: [facility];  Service: Orthopedics;  Laterality: Right;  Right Hip Scope with Labral Debridement  . Shoulder arthroscopy  05-06-12    bone spur  . Carpal tunnel release  05-06-12    Right  . Total hip arthroplasty Right 05/13/2012    Procedure: TOTAL HIP ARTHROPLASTY ANTERIOR APPROACH;  Surgeon: Loanne Drilling, MD;  Location: [facility];  Service: Orthopedics;  Laterality: Right;  . Anterior cervical decomp/discectomy fusion N/A 10/30/2012    Procedure: ACDF C5-6, EXPLORATION AND HARDWARE REMOVAL C6-7;  Surgeon: Venita Lick, MD;  Location: [facility] OR;  Service: Orthopedics;  Laterality: N/A;   No family history on file. History  Substance Use Topics  . Smoking status: Never Smoker   . Smokeless tobacco: Never Used  . Alcohol Use: No   OB History   Grav Para Term Preterm Abortions TAB SAB Ect Mult Living                 Review of Systems  HENT: Positive for neck pain.   All other systems reviewed and are negative.    Allergies  Shellfish allergy; Decadron; Papaya derivatives; Betadine; Chlorhexidine; Clarithromycin; Clindamycin/lincomycin; Codeine; Other; and Sulfa antibiotics  Home Medications   Current Outpatient Rx  Name  Route  Sig  Dispense  Refill  . albuterol (PROVENTIL  HFA;VENTOLIN HFA) 108 (90 BASE) MCG/ACT inhaler   Inhalation   Inhale 2 puffs into the lungs every 6 (six) hours as needed for wheezing.         Marland Kitchen ALPRAZolam (XANAX) 1 MG tablet   Oral   Take 1-2 mg by mouth 2 (two) times daily as needed for anxiety.         Marland Kitchen amLODipine (NORVASC) 5 MG tablet   Oral   Take 5 mg by mouth daily.         Marland Kitchen buPROPion (WELLBUTRIN SR) 150 MG 12 hr tablet   Oral   Take 150 mg by mouth 2 (two) times daily.         Marland Kitchen docusate sodium (COLACE) 100 MG capsule   Oral   Take 1 capsule (100 mg total) by mouth 3 (three) times daily as needed for constipation.   30 capsule   0   . Fluticasone-Salmeterol (ADVAIR) 250-50 MCG/DOSE AEPB   Inhalation   Inhale 1 puff into the lungs every 12 (twelve) hours.         Marland Kitchen lubiprostone (AMITIZA) 8 MCG capsule   Oral   Take 8 mcg by mouth 2 (two) times daily with a meal.         . methocarbamol (ROBAXIN) 500 MG tablet   Oral   Take 500 mg by mouth 3 (three) times daily as needed (muscle spasms).         . metoprolol succinate (TOPROL-XL) 50 MG 24 hr tablet   Oral   Take 50 mg by mouth daily.         . ondansetron (ZOFRAN) 4 MG tablet   Oral   Take 1 tablet (4 mg total) by mouth every 8 (eight) hours as needed for nausea.   20 tablet   0   . oxyCODONE-acetaminophen (PERCOCET) 10-325 MG per tablet   Oral   Take 1 tablet by mouth every 4 (four) hours as needed for pain.         . polyethylene glycol powder (GLYCOLAX) powder   Oral   Take 17 g by mouth daily.   255 g   1    BP 155/97  Pulse 83  Temp(Src) 97.5 F (36.4 C) (Axillary)  Resp 16  SpO2 100% Physical Exam  Nursing note and vitals reviewed.  52 year old female, resting comfortably and in no acute distress. Vital signs are significant for hypertension with blood pressure 155/97. Oxygen saturation is 100%, which is normal. Head is normocephalic and atraumatic.  PERRLA, EOMI. Oropharynx is clear. Neck is immobilized in a stiff  cervical collar. Back is nontender and there is no CVA tenderness. Lungs are clear without rales, wheezes, or rhonchi. Chest is nontender. Heart has regular rate and rhythm without murmur. Abdomen is soft, flat, nontender without masses or hepatosplenomegaly and peristalsis is normoactive. Extremities have no cyanosis or edema, full range of motion is present. Skin is warm and dry without rash. Neurologic: Mental status is normal, cranial nerves are intact, there are no motor or sensory deficits. Careful motor and sensory exam of upper extremities was completely normal.  ED Course  Procedures (including critical care time) Labs Review Labs Reviewed - No data to display Imaging Review No results found.   Date: 11/28/2012  Rate: 74  Rhythm: normal sinus rhythm  QRS Axis: right  Intervals: normal  ST/T Wave abnormalities: nonspecific T wave changes  Conduction Disutrbances:none  Narrative Interpretation: Nonspecific T wave changes, right axis deviation. When compared with ECG of 10/25/2012, no significant changes are seen.  Old EKG Reviewed: none available   MDM   1. Neck pain    Severe neck pain status post cervical fusion. I reviewed her prior records and she had she had hardware from a previous surgery removed and then had a new fusion done. MRI is not available at this time. She will be given hydromorphone for pain.  12:44 AM She had significant improvement in pain with hydromorphone. Pain is down to 6/10 and she feels that this is tolerable. However, she did have a brief episode of a tight feeling in her chest. ECG will be checked.  1:13 AM ECG is unchanged from baseline. Patient feels comfortable with degree of pain relief she has received in the ED and she is discharged with prescription for hydromorphone. She is to contact her orthopedic surgeon to schedule her MRI scan.  Dione Booze, MD 11/28/12 [phone]

## 2012-11-28 ENCOUNTER — Ambulatory Visit (HOSPITAL_COMMUNITY): Payer: Medicaid Other

## 2012-11-28 MED ORDER — HYDROMORPHONE HCL 4 MG PO TABS: 4.0000 mg | ORAL_TABLET | ORAL | Status: DC | PRN

## 2012-11-28 MED ORDER — ONDANSETRON HCL 4 MG PO TABS: 4.0000 mg | ORAL_TABLET | Freq: Four times a day (QID) | ORAL | Status: DC | PRN

## 2013-01-16 ENCOUNTER — Other Ambulatory Visit: Payer: Self-pay

## 2013-02-25 ENCOUNTER — Other Ambulatory Visit: Payer: Self-pay | Admitting: Orthopedic Surgery

## 2013-02-25 DIAGNOSIS — Z981 Arthrodesis status: Secondary | ICD-10-CM

## 2013-02-26 ENCOUNTER — Ambulatory Visit: Payer: Self-pay | Admitting: Pain Medicine

## 2013-02-26 ENCOUNTER — Other Ambulatory Visit: Payer: Self-pay | Admitting: Pain Medicine

## 2013-02-26 LAB — BASIC METABOLIC PANEL
Anion Gap: 5 — ABNORMAL LOW (ref 7–16)
BUN: 13 mg/dL (ref 7–18)
Calcium, Total: 9.2 mg/dL (ref 8.5–10.1)
Creatinine: 0.76 mg/dL (ref 0.60–1.30)
EGFR (African American): >60
Glucose: 95 mg/dL (ref 65–99)
Potassium: 4 mmol/L (ref 3.5–5.1)

## 2013-02-26 LAB — MAGNESIUM: Magnesium: 1.8 mg/dL

## 2013-02-26 LAB — HEPATIC FUNCTION PANEL A (ARMC)
Alkaline Phosphatase: 129 U/L — ABNORMAL HIGH
Bilirubin, Direct: 0.1 mg/dL (ref 0.00–0.20)
SGPT (ALT): 23 U/L (ref 12–78)
Total Protein: 7.2 g/dL (ref 6.4–8.2)

## 2013-02-28 ENCOUNTER — Ambulatory Visit
Admission: RE | Admit: 2013-02-28 | Discharge: 2013-02-28 | Disposition: A | Discharge status: Home / Self Care | Payer: Medicare Other | Source: Ambulatory Visit | Attending: Orthopedic Surgery | Admitting: Orthopedic Surgery

## 2013-02-28 DIAGNOSIS — Z981 Arthrodesis status: Secondary | ICD-10-CM

## 2013-04-07 ENCOUNTER — Encounter (HOSPITAL_COMMUNITY): Payer: Self-pay | Admitting: Pharmacy Technician

## 2013-04-07 NOTE — H&P (Signed)
History of Present Illness  The patient is a 53 year old female who presents today for follow up of their neck. The patient is being followed for their neck pain. They are 18 week(s) (and 1/2 post op ACDF C5-6 on 10/30/12) out. Symptoms reported today include: pain ("feels horrible" and is here to discuss her CT scan at [facility]), aching, stiffness, weakness (bilateral arms), pain with lying and pain with lifting (painful to lift milk, pans, and brushing her own hair). The patient feels that they are doing poorly and report their pain level to be severe and 9 / 10 (pain increases at times, pain never goes away, Percocet brings pain level to a 4). The following medication has been used for pain control: antiinflammatory medication (Ibuprofen and Aleve) and Percocet (10/325 qid "doesn't touch the pain"). The patient presents today following CT scan.     Subjective Transcription  She returns today to review her CT scan. She is still having dysphagia, significant neck pain and recurrent increasing arm pain. This has all gotten worse since beginning of December. X-rays at her last visit showed hardware complications with cut out the screws into the C4-5 disc space. She returns today with her CT scan. The CT scan was reviewed. It does demonstrate that there has been subsidence of the C5 vertebral body with now migration of the locking screws into the 4-5 disc space. There is no direct neural compromise or compression of the screws. There is a definite change from her immediate postoperative x-rays of the hardware. Both C5 screws are superiorly displaced into the disc space but the anterior plate interbody spacer appeared to be stable. There is subsidence though. There is very little of the C5 vertebral body left.    Allergies Shellfish Clindamycin HCl *ANTI-INFECTIVE AGENTS - MISC.*. Hives. Sulfa Drugs. Nausea, Vomiting. Codeine/Codeine Derivatives. Hives. Tylenol #3 Shellfish.  10/01/2007 (Marked as Inactive) CODEINE. 10/01/2007 (Marked as Inactive)    Social History Post-Surgical Plans. Plan is for home. Current occupation. Nurse No alcohol use Alcohol use. never consumed alcohol Children. 3 Current work status. Disabled. disabled Drug/Alcohol Rehab (Currently). no Exercise. Exercises daily; does running / walking Illicit drug use. no Living situation. live with partner Marital status. divorced Number of flights of stairs before winded. greater than 5 Pain Contract. yes Previously in rehab. no Tobacco / smoke exposure. no Tobacco use. Never smoker. never smoker    Medication History Valium (5MG  Tablet, 1 (one) Tablet Oral 1 po tid, Taken starting 01/17/2013) Active. (RF FAX'D TO [facility] AT 578 0266 DDB/SMT 01/17/13) Percocet (10-325MG  Tablet, 1-2 Oral tid, Taken starting 11/12/2012) Active. (RX GIVEN AT VISIT per Dr. Rolena Infante on 11/12/2012 MLF) Wellbutrin (100MG  Tablet, Oral) Active. (bid) Advair Diskus ( Inhalation) Specific dose unknown - Active. (prn) Albuterol Sulfate ((5 MG/ML)0.5% Nebulized Soln, Inhalation) Active. (prn) Metoprolol Succinate (50MG  Tablet ER, Oral) Active. (qd) AmLODIPine Besylate (5MG  Tablet, Oral) Active. Linhist LA (4-20-50MG  Capsule ER, Oral) Active. Medications Reconciled.    Objective Transcription  She continues to have significant neck pain and radicular arm pain. This is severe. She has difficulty eating and swallowing. No shortness of breath or chest pain. Abdomen is soft, nontender. She has a normal gait pattern. No new focal neurological deficits in the upper or lower extremities. She has 5/5 strength but there is still numbness and dysesthesias diffusely in the upper extremities. Negative clonus. Negative Babinski. Negative Hoffman sign.     Assessment & Plan  She is still in the preliminary steps  of her pain management. I would like for her to continue so that  postoperatively she can have a pain specialist. I have given her OxyContin today as a supplement to her Percocet. Hopefully at the end of the month on 03/09/2013, we will be able to proceed with surgery. She does need some times to arrange things with her grandchildren. I do not think given the extent of this operation that she will be able to be a fit primary care provider for the small kids and so she needs to arrange appropriate care for them. I will give a copy of this to the [facility] where she is currently being evaluated to let them know of the changes and the upcoming surgery.

## 2013-04-11 ENCOUNTER — Encounter (HOSPITAL_COMMUNITY)
Admission: RE | Admit: 2013-04-11 | Discharge: 2013-04-11 | Disposition: A | Discharge status: Home / Self Care | Payer: Medicare Other | Source: Ambulatory Visit | Attending: Orthopedic Surgery | Admitting: Orthopedic Surgery

## 2013-04-11 ENCOUNTER — Ambulatory Visit (HOSPITAL_COMMUNITY)
Admission: RE | Admit: 2013-04-11 | Discharge: 2013-04-11 | Disposition: A | Discharge status: Home / Self Care | Payer: Medicare Other | Source: Ambulatory Visit | Attending: Orthopedic Surgery | Admitting: Orthopedic Surgery

## 2013-04-11 ENCOUNTER — Encounter (HOSPITAL_COMMUNITY): Payer: Self-pay

## 2013-04-11 DIAGNOSIS — M47812 Spondylosis without myelopathy or radiculopathy, cervical region: Secondary | ICD-10-CM | POA: Insufficient documentation

## 2013-04-11 DIAGNOSIS — Z01818 Encounter for other preprocedural examination: Secondary | ICD-10-CM | POA: Insufficient documentation

## 2013-04-11 DIAGNOSIS — Z01812 Encounter for preprocedural laboratory examination: Secondary | ICD-10-CM | POA: Insufficient documentation

## 2013-04-11 LAB — BASIC METABOLIC PANEL
BUN: 7 mg/dL (ref 6–23)
CHLORIDE: 101 meq/L (ref 96–112)
CO2: 21 mEq/L (ref 19–32)
Calcium: 9.6 mg/dL (ref 8.4–10.5)
Creatinine, Ser: 0.72 mg/dL (ref 0.50–1.10)
GFR calc non Af Amer: >90 mL/min (ref >90)
Glucose, Bld: 91 mg/dL (ref 70–99)
POTASSIUM: 3.7 meq/L (ref 3.7–5.3)
Sodium: 138 mEq/L (ref 137–147)

## 2013-04-11 LAB — CBC
HEMATOCRIT: 40.2 % (ref 36.0–46.0)
HEMOGLOBIN: 14.1 g/dL (ref 12.0–15.0)
MCH: 30.3 pg (ref 26.0–34.0)
MCHC: 35.1 g/dL (ref 30.0–36.0)
MCV: 86.5 fL (ref 78.0–100.0)
Platelets: 314 10*3/uL (ref 150–400)
RBC: 4.65 MIL/uL (ref 3.87–5.11)
RDW: 13 % (ref 11.5–15.5)
WBC: 5.6 10*3/uL (ref 4.0–10.5)

## 2013-04-11 LAB — SURGICAL PCR SCREEN
MRSA, PCR: NEGATIVE
STAPHYLOCOCCUS AUREUS: NEGATIVE

## 2013-04-11 NOTE — Pre-Procedure Instructions (Signed)
[name]  04/11/2013   Your procedure is scheduled on:  February 4  Report to [facility] Entr[facility] at [employer] AM.  Call this number if you have problems the morning of surgery: [phone]   Remember:   Do not eat food or drink liquids after midnight.   Take these medicines the morning of surgery with A SIP OF WATER: Albuterol (if needed), Xanax (if needed), Amlodipine, Wellbutrin, Metoprolol, Zofran (if needed), Oxycodone (if needed), Linaclotide   STOP/ Do not take Aspirin, Aleve, Naproxen, Advil, Ibuprofen, Vitamin, Herbs, or Supplements starting today   Do not wear jewelry, make-up or nail polish.  Do not wear lotions, powders, or perfumes. You may wear deodorant.  Do not shave 48 hours prior to surgery. Men may shave face and neck.  Do not bring valuables to the hospital.  [facility] is not responsible                  for any belongings or valuables.               Contacts, dentures or bridgework may not be worn into surgery.  Leave suitcase in the car. After surgery it may be brought to your room.  For patients admitted to the hospital, discharge time is determined by your                treatment team.               Special Instructions: Shower using an Antibacterial Soap such as Dial 2 nights before surgery and the night before surgery.  If you shower the day of surgery use the antibacterial soap.     Please read over the following fact sheets that you were given: Pain Booklet, Coughing and Deep Breathing and Surgical Site Infection Prevention

## 2013-04-11 NOTE — Progress Notes (Signed)
Left message with office for Dr. Rolena Infante regarding patient allergies to CHG and Betadine. Notified him that I am instructing patient to use antibacterial soap of choice (dial, etc) instead of CHG soap 2 nights out and the night before. Prep card labeled with No CHG/ Betadine Prep Allergy. Ask MD to put in order for different prep if needed

## 2013-04-14 ENCOUNTER — Ambulatory Visit: Payer: Self-pay | Admitting: Pain Medicine

## 2013-04-15 MED ORDER — VANCOMYCIN HCL IN DEXTROSE 1-5 GM/200ML-% IV SOLN
1000.0000 mg | Freq: Two times a day (BID) | INTRAVENOUS | Status: DC
  Administered 2013-04-16: 1000 mg via INTRAVENOUS
  Filled 2013-04-15: qty 200

## 2013-04-16 ENCOUNTER — Inpatient Hospital Stay (HOSPITAL_COMMUNITY): Payer: Medicare Other

## 2013-04-16 ENCOUNTER — Encounter (HOSPITAL_COMMUNITY): Payer: Medicare Other | Admitting: Anesthesiology

## 2013-04-16 ENCOUNTER — Encounter (HOSPITAL_COMMUNITY): Payer: Self-pay | Admitting: *Deleted

## 2013-04-16 ENCOUNTER — Inpatient Hospital Stay (HOSPITAL_COMMUNITY)
Admission: RE | Admit: 2013-04-16 | Discharge: 2013-04-24 | DRG: 454 | Disposition: A | Discharge status: Home Health | Payer: Medicare Other | Source: Ambulatory Visit | Attending: Orthopedic Surgery | Admitting: Orthopedic Surgery

## 2013-04-16 ENCOUNTER — Inpatient Hospital Stay (HOSPITAL_COMMUNITY): Payer: Medicare Other | Admitting: Anesthesiology

## 2013-04-16 ENCOUNTER — Encounter (HOSPITAL_COMMUNITY)
Admission: RE | Disposition: A | Discharge status: Home Health | Payer: Self-pay | Source: Ambulatory Visit | Attending: Orthopedic Surgery

## 2013-04-16 DIAGNOSIS — Z79899 Other long term (current) drug therapy: Secondary | ICD-10-CM

## 2013-04-16 DIAGNOSIS — F3289 Other specified depressive episodes: Secondary | ICD-10-CM | POA: Diagnosis present

## 2013-04-16 DIAGNOSIS — G8929 Other chronic pain: Secondary | ICD-10-CM | POA: Diagnosis present

## 2013-04-16 DIAGNOSIS — I951 Orthostatic hypotension: Secondary | ICD-10-CM | POA: Diagnosis not present

## 2013-04-16 DIAGNOSIS — R42 Dizziness and giddiness: Secondary | ICD-10-CM | POA: Diagnosis present

## 2013-04-16 DIAGNOSIS — M542 Cervicalgia: Secondary | ICD-10-CM

## 2013-04-16 DIAGNOSIS — E871 Hypo-osmolality and hyponatremia: Secondary | ICD-10-CM

## 2013-04-16 DIAGNOSIS — Y831 Surgical operation with implant of artificial internal device as the cause of abnormal reaction of the patient, or of later complication, without mention of misadventure at the time of the procedure: Secondary | ICD-10-CM | POA: Diagnosis present

## 2013-04-16 DIAGNOSIS — Z96649 Presence of unspecified artificial hip joint: Secondary | ICD-10-CM

## 2013-04-16 DIAGNOSIS — T84498A Other mechanical complication of other internal orthopedic devices, implants and grafts, initial encounter: Principal | ICD-10-CM | POA: Diagnosis present

## 2013-04-16 DIAGNOSIS — R1310 Dysphagia, unspecified: Secondary | ICD-10-CM | POA: Diagnosis present

## 2013-04-16 DIAGNOSIS — F329 Major depressive disorder, single episode, unspecified: Secondary | ICD-10-CM | POA: Diagnosis present

## 2013-04-16 DIAGNOSIS — K560 Paralytic ileus: Secondary | ICD-10-CM | POA: Diagnosis not present

## 2013-04-16 DIAGNOSIS — Z981 Arthrodesis status: Secondary | ICD-10-CM

## 2013-04-16 DIAGNOSIS — M169 Osteoarthritis of hip, unspecified: Secondary | ICD-10-CM

## 2013-04-16 DIAGNOSIS — I10 Essential (primary) hypertension: Secondary | ICD-10-CM | POA: Diagnosis present

## 2013-04-16 DIAGNOSIS — J45909 Unspecified asthma, uncomplicated: Secondary | ICD-10-CM | POA: Diagnosis present

## 2013-04-16 HISTORY — PX: POSTERIOR CERVICAL FUSION/FORAMINOTOMY: SHX5038

## 2013-04-16 SURGERY — POSTERIOR CERVICAL FUSION/FORAMINOTOMY LEVEL 2
Anesthesia: General | Site: Spine Cervical

## 2013-04-16 MED ORDER — LINACLOTIDE 290 MCG PO CAPS
290.0000 ug | ORAL_CAPSULE | Freq: Every day | ORAL | Status: DC
  Administered 2013-04-17 – 2013-04-24 (×8): 290 ug via ORAL
  Filled 2013-04-16 (×10): qty 1

## 2013-04-16 MED ORDER — BUPIVACAINE-EPINEPHRINE 0.25% -1:200000 IJ SOLN
INTRAMUSCULAR | Status: DC | PRN
  Administered 2013-04-16: 20 mL

## 2013-04-16 MED ORDER — LIDOCAINE HCL (CARDIAC) 20 MG/ML IV SOLN
INTRAVENOUS | Status: AC
  Filled 2013-04-16: qty 5

## 2013-04-16 MED ORDER — METHOCARBAMOL 100 MG/ML IJ SOLN: 500.0000 mg | Freq: Four times a day (QID) | INTRAVENOUS | Status: DC | PRN

## 2013-04-16 MED ORDER — LORAZEPAM 2 MG/ML IJ SOLN
1.0000 mg | Freq: Once | INTRAMUSCULAR | Status: AC
  Administered 2013-04-16: 1 mg via INTRAVENOUS

## 2013-04-16 MED ORDER — VANCOMYCIN HCL IN DEXTROSE 1-5 GM/200ML-% IV SOLN
1000.0000 mg | Freq: Two times a day (BID) | INTRAVENOUS | Status: AC
  Administered 2013-04-17 (×2): 1000 mg via INTRAVENOUS
  Filled 2013-04-16 (×2): qty 200

## 2013-04-16 MED ORDER — LORAZEPAM 2 MG/ML IJ SOLN
INTRAMUSCULAR | Status: AC
  Filled 2013-04-16: qty 1

## 2013-04-16 MED ORDER — BUPIVACAINE-EPINEPHRINE (PF) 0.25% -1:200000 IJ SOLN
INTRAMUSCULAR | Status: AC
  Filled 2013-04-16: qty 30

## 2013-04-16 MED ORDER — DEXAMETHASONE SODIUM PHOSPHATE 10 MG/ML IJ SOLN
INTRAMUSCULAR | Status: AC
  Administered 2013-04-16: 6 mg via INTRAVENOUS
  Filled 2013-04-16: qty 1

## 2013-04-16 MED ORDER — PROPOFOL INFUSION 10 MG/ML OPTIME
INTRAVENOUS | Status: DC | PRN
  Administered 2013-04-16: 50 ug/kg/min via INTRAVENOUS

## 2013-04-16 MED ORDER — ZOLPIDEM TARTRATE 5 MG PO TABS
5.0000 mg | ORAL_TABLET | Freq: Every evening | ORAL | Status: DC | PRN
  Administered 2013-04-17 – 2013-04-19 (×3): 5 mg via ORAL
  Filled 2013-04-16 (×3): qty 1

## 2013-04-16 MED ORDER — PROPOFOL 10 MG/ML IV BOLUS
INTRAVENOUS | Status: AC
  Filled 2013-04-16: qty 20

## 2013-04-16 MED ORDER — ONDANSETRON HCL 4 MG/2ML IJ SOLN: 4.0000 mg | Freq: Once | INTRAMUSCULAR | Status: DC | PRN

## 2013-04-16 MED ORDER — 0.9 % SODIUM CHLORIDE (POUR BTL) OPTIME
TOPICAL | Status: DC | PRN
  Administered 2013-04-16: 1000 mL

## 2013-04-16 MED ORDER — DIPHENHYDRAMINE HCL 12.5 MG/5ML PO ELIX
12.5000 mg | ORAL_SOLUTION | Freq: Four times a day (QID) | ORAL | Status: DC | PRN
  Filled 2013-04-16: qty 5

## 2013-04-16 MED ORDER — PHENYLEPHRINE HCL 10 MG/ML IJ SOLN
INTRAMUSCULAR | Status: DC | PRN
  Administered 2013-04-16 (×5): 80 ug via INTRAVENOUS

## 2013-04-16 MED ORDER — SODIUM CHLORIDE 0.9 % IJ SOLN: 3.0000 mL | INTRAMUSCULAR | Status: DC | PRN

## 2013-04-16 MED ORDER — MIDAZOLAM HCL 5 MG/5ML IJ SOLN
INTRAMUSCULAR | Status: DC | PRN
  Administered 2013-04-16: 2 mg via INTRAVENOUS

## 2013-04-16 MED ORDER — LACTATED RINGERS IV SOLN
INTRAVENOUS | Status: DC | PRN
  Administered 2013-04-16 (×2): via INTRAVENOUS

## 2013-04-16 MED ORDER — OXYCODONE HCL 5 MG/5ML PO SOLN: 5.0000 mg | Freq: Once | ORAL | Status: DC | PRN

## 2013-04-16 MED ORDER — EPHEDRINE SULFATE 50 MG/ML IJ SOLN
INTRAMUSCULAR | Status: DC | PRN
  Administered 2013-04-16 (×5): 10 mg via INTRAVENOUS

## 2013-04-16 MED ORDER — FENTANYL CITRATE 0.05 MG/ML IJ SOLN
INTRAMUSCULAR | Status: AC
  Filled 2013-04-16: qty 5

## 2013-04-16 MED ORDER — DEXAMETHASONE SODIUM PHOSPHATE 4 MG/ML IJ SOLN
INTRAMUSCULAR | Status: AC
  Filled 2013-04-16: qty 2

## 2013-04-16 MED ORDER — MIDAZOLAM HCL 2 MG/2ML IJ SOLN
INTRAMUSCULAR | Status: AC
  Filled 2013-04-16: qty 2

## 2013-04-16 MED ORDER — HYDROMORPHONE HCL PF 1 MG/ML IJ SOLN
INTRAMUSCULAR | Status: AC
  Administered 2013-04-16: 0.5 mg via INTRAVENOUS
  Filled 2013-04-16: qty 1

## 2013-04-16 MED ORDER — ACETAMINOPHEN 10 MG/ML IV SOLN
1000.0000 mg | Freq: Four times a day (QID) | INTRAVENOUS | Status: DC
  Administered 2013-04-17 (×2): 1000 mg via INTRAVENOUS
  Filled 2013-04-16 (×4): qty 100

## 2013-04-16 MED ORDER — LIDOCAINE HCL (CARDIAC) 20 MG/ML IV SOLN
INTRAVENOUS | Status: DC | PRN
  Administered 2013-04-16: 100 mg via INTRAVENOUS

## 2013-04-16 MED ORDER — SUCCINYLCHOLINE CHLORIDE 20 MG/ML IJ SOLN
INTRAMUSCULAR | Status: DC | PRN
  Administered 2013-04-16: 40 mg via INTRAVENOUS

## 2013-04-16 MED ORDER — ONDANSETRON HCL 4 MG/2ML IJ SOLN
INTRAMUSCULAR | Status: AC
  Filled 2013-04-16: qty 2

## 2013-04-16 MED ORDER — SODIUM CHLORIDE 0.9 % IJ SOLN
3.0000 mL | Freq: Two times a day (BID) | INTRAMUSCULAR | Status: DC
  Administered 2013-04-17 – 2013-04-22 (×10): 3 mL via INTRAVENOUS

## 2013-04-16 MED ORDER — PHENOL 1.4 % MT LIQD: 1.0000 | OROMUCOSAL | Status: DC | PRN

## 2013-04-16 MED ORDER — EPHEDRINE SULFATE 50 MG/ML IJ SOLN
INTRAMUSCULAR | Status: AC
  Filled 2013-04-16: qty 1

## 2013-04-16 MED ORDER — HEMOSTATIC AGENTS (NO CHARGE) OPTIME
TOPICAL | Status: DC | PRN
  Administered 2013-04-16: 2 via TOPICAL

## 2013-04-16 MED ORDER — AMLODIPINE BESYLATE 5 MG PO TABS
5.0000 mg | ORAL_TABLET | Freq: Every day | ORAL | Status: DC
  Administered 2013-04-17 – 2013-04-22 (×5): 5 mg via ORAL
  Filled 2013-04-16 (×6): qty 1

## 2013-04-16 MED ORDER — OXYCODONE HCL 5 MG PO TABS: 5.0000 mg | ORAL_TABLET | Freq: Once | ORAL | Status: DC | PRN

## 2013-04-16 MED ORDER — FENTANYL 10 MCG/ML IV SOLN
INTRAVENOUS | Status: DC
  Administered 2013-04-16: 18:00:00 via INTRAVENOUS
  Administered 2013-04-17: 0.1 ug via INTRAVENOUS
  Administered 2013-04-17 (×2): 60 ug via INTRAVENOUS
  Filled 2013-04-16: qty 50

## 2013-04-16 MED ORDER — LACTATED RINGERS IV SOLN
INTRAVENOUS | Status: DC | PRN
  Administered 2013-04-16: 09:00:00 via INTRAVENOUS

## 2013-04-16 MED ORDER — ONDANSETRON HCL 4 MG/2ML IJ SOLN: 4.0000 mg | Freq: Four times a day (QID) | INTRAMUSCULAR | Status: DC | PRN

## 2013-04-16 MED ORDER — DIPHENHYDRAMINE HCL 50 MG/ML IJ SOLN: 12.5000 mg | Freq: Four times a day (QID) | INTRAMUSCULAR | Status: DC | PRN

## 2013-04-16 MED ORDER — BUPROPION HCL ER (SR) 150 MG PO TB12
150.0000 mg | ORAL_TABLET | Freq: Two times a day (BID) | ORAL | Status: DC
  Administered 2013-04-17 – 2013-04-24 (×16): 150 mg via ORAL
  Filled 2013-04-16 (×19): qty 1

## 2013-04-16 MED ORDER — METHOCARBAMOL 500 MG PO TABS
500.0000 mg | ORAL_TABLET | Freq: Four times a day (QID) | ORAL | Status: DC | PRN
  Administered 2013-04-17 – 2013-04-24 (×16): 500 mg via ORAL
  Filled 2013-04-16 (×20): qty 1

## 2013-04-16 MED ORDER — SUCCINYLCHOLINE CHLORIDE 20 MG/ML IJ SOLN
INTRAMUSCULAR | Status: AC
  Filled 2013-04-16: qty 1

## 2013-04-16 MED ORDER — THROMBIN 20000 UNITS EX SOLR
CUTANEOUS | Status: AC
  Filled 2013-04-16: qty 20000

## 2013-04-16 MED ORDER — KETAMINE HCL 100 MG/ML IJ SOLN
INTRAMUSCULAR | Status: AC
  Filled 2013-04-16: qty 1

## 2013-04-16 MED ORDER — HYDROMORPHONE HCL PF 1 MG/ML IJ SOLN
0.5000 mg | INTRAMUSCULAR | Status: DC | PRN
  Administered 2013-04-16 (×2): 0.5 mg via INTRAVENOUS

## 2013-04-16 MED ORDER — MENTHOL 3 MG MT LOZG: 1.0000 | LOZENGE | OROMUCOSAL | Status: DC | PRN

## 2013-04-16 MED ORDER — ACETAMINOPHEN 10 MG/ML IV SOLN
1000.0000 mg | Freq: Four times a day (QID) | INTRAVENOUS | Status: DC
  Administered 2013-04-16: 1000 mg via INTRAVENOUS
  Filled 2013-04-16: qty 100

## 2013-04-16 MED ORDER — HYDROMORPHONE HCL PF 1 MG/ML IJ SOLN
INTRAMUSCULAR | Status: AC
  Administered 2013-04-16: 0.5 mg via INTRAVENOUS
  Filled 2013-04-16: qty 2

## 2013-04-16 MED ORDER — ROCURONIUM BROMIDE 50 MG/5ML IV SOLN
INTRAVENOUS | Status: AC
  Filled 2013-04-16: qty 1

## 2013-04-16 MED ORDER — ALPRAZOLAM 0.5 MG PO TABS
1.0000 mg | ORAL_TABLET | Freq: Two times a day (BID) | ORAL | Status: DC | PRN
  Administered 2013-04-17: 2 mg via ORAL
  Administered 2013-04-18 – 2013-04-20 (×2): 1 mg via ORAL
  Administered 2013-04-20: 2 mg via ORAL
  Administered 2013-04-21 – 2013-04-23 (×3): 1 mg via ORAL
  Filled 2013-04-16: qty 2
  Filled 2013-04-16: qty 4
  Filled 2013-04-16 (×3): qty 2
  Filled 2013-04-16: qty 4
  Filled 2013-04-16: qty 2

## 2013-04-16 MED ORDER — SODIUM CHLORIDE 0.9 % IJ SOLN
INTRAMUSCULAR | Status: AC
  Filled 2013-04-16: qty 10

## 2013-04-16 MED ORDER — SODIUM CHLORIDE 0.9 % IV SOLN
250.0000 mL | INTRAVENOUS | Status: DC
Start: 2013-04-16 — End: 2013-04-22

## 2013-04-16 MED ORDER — SODIUM CHLORIDE 0.9 % IV SOLN
250.0000 mg | INTRAVENOUS | Status: DC | PRN
  Administered 2013-04-16: 5 ug/kg/min via INTRAVENOUS

## 2013-04-16 MED ORDER — PROPOFOL 10 MG/ML IV BOLUS
INTRAVENOUS | Status: DC | PRN
  Administered 2013-04-16: 140 mg via INTRAVENOUS

## 2013-04-16 MED ORDER — PHENYLEPHRINE 40 MCG/ML (10ML) SYRINGE FOR IV PUSH (FOR BLOOD PRESSURE SUPPORT)
PREFILLED_SYRINGE | INTRAVENOUS | Status: AC
  Filled 2013-04-16: qty 10

## 2013-04-16 MED ORDER — METOPROLOL SUCCINATE ER 50 MG PO TB24
50.0000 mg | ORAL_TABLET | Freq: Every day | ORAL | Status: DC
  Administered 2013-04-17 – 2013-04-24 (×7): 50 mg via ORAL
  Filled 2013-04-16 (×8): qty 1

## 2013-04-16 MED ORDER — LACTATED RINGERS IV SOLN: INTRAVENOUS | Status: DC

## 2013-04-16 MED ORDER — THROMBIN 20000 UNITS EX SOLR
CUTANEOUS | Status: DC | PRN
  Administered 2013-04-16: 10:00:00 via TOPICAL

## 2013-04-16 MED ORDER — DEXAMETHASONE SODIUM PHOSPHATE 4 MG/ML IJ SOLN
INTRAMUSCULAR | Status: DC | PRN
  Administered 2013-04-16: 8 mg via INTRAVENOUS

## 2013-04-16 MED ORDER — SODIUM CHLORIDE 0.9 % IV SOLN
10.0000 mg | INTRAVENOUS | Status: DC | PRN
  Administered 2013-04-16: 10 ug/min via INTRAVENOUS

## 2013-04-16 MED ORDER — FENTANYL CITRATE 0.05 MG/ML IJ SOLN
INTRAMUSCULAR | Status: DC | PRN
  Administered 2013-04-16: 100 ug via INTRAVENOUS
  Administered 2013-04-16 (×6): 50 ug via INTRAVENOUS

## 2013-04-16 MED ORDER — ONDANSETRON HCL 4 MG/2ML IJ SOLN
INTRAMUSCULAR | Status: DC | PRN
  Administered 2013-04-16 (×2): 4 mg via INTRAVENOUS

## 2013-04-16 MED ORDER — ONDANSETRON HCL 4 MG/2ML IJ SOLN: 4.0000 mg | INTRAMUSCULAR | Status: DC | PRN

## 2013-04-16 MED ORDER — ALBUTEROL SULFATE (2.5 MG/3ML) 0.083% IN NEBU: 3.0000 mL | INHALATION_SOLUTION | Freq: Four times a day (QID) | RESPIRATORY_TRACT | Status: DC | PRN

## 2013-04-16 MED ORDER — SODIUM CHLORIDE 0.9 % IJ SOLN: 9.0000 mL | INTRAMUSCULAR | Status: DC | PRN

## 2013-04-16 MED ORDER — HYDROMORPHONE HCL PF 1 MG/ML IJ SOLN
0.2500 mg | INTRAMUSCULAR | Status: DC | PRN
  Administered 2013-04-16 (×4): 0.5 mg via INTRAVENOUS

## 2013-04-16 MED ORDER — OXYCODONE HCL 5 MG PO TABS
10.0000 mg | ORAL_TABLET | ORAL | Status: DC | PRN
Start: 2013-04-16 — End: 2013-04-18
  Administered 2013-04-17 – 2013-04-18 (×7): 10 mg via ORAL
  Filled 2013-04-16 (×7): qty 2

## 2013-04-16 MED ORDER — NALOXONE HCL 0.4 MG/ML IJ SOLN: 0.4000 mg | INTRAMUSCULAR | Status: DC | PRN

## 2013-04-16 MED ORDER — DEXAMETHASONE SODIUM PHOSPHATE 10 MG/ML IJ SOLN
6.0000 mg | Freq: Four times a day (QID) | INTRAMUSCULAR | Status: AC
  Administered 2013-04-16 – 2013-04-17 (×4): 6 mg via INTRAVENOUS
  Filled 2013-04-16 (×3): qty 0.6

## 2013-04-16 SURGICAL SUPPLY — 92 items
ADH SKN CLS APL DERMABOND .7 (GAUZE/BANDAGES/DRESSINGS) ×1
BIT DRILL MOUNTAINEER FIX 14 (BIT) ×1
BIT DRILL MOUNTAINEER FIX 14MM (BIT) IMPLANT
BIT DRILL MOUNTAINER FXED 12MM (DRILL) IMPLANT
BLADE SURG ROTATE 9660 (MISCELLANEOUS) IMPLANT
CAGE CERVICAL BENGAL 16 (Cage) ×1 IMPLANT
CAGE CERVICAL BENGAL 16MM (Cage) ×1 IMPLANT
CLOSURE STERI-STRIP 1/2X4 (GAUZE/BANDAGES/DRESSINGS) ×3
CLOSURE WOUND 1/2 X4 (GAUZE/BANDAGES/DRESSINGS)
CLOTH BEACON ORANGE TIMEOUT ST (SAFETY) ×3 IMPLANT
CLSR STERI-STRIP ANTIMIC 1/2X4 (GAUZE/BANDAGES/DRESSINGS) ×3 IMPLANT
CORDS BIPOLAR (ELECTRODE) ×3 IMPLANT
COVER MAYO STAND STRL (DRAPES) ×6 IMPLANT
COVER SURGICAL LIGHT HANDLE (MISCELLANEOUS) ×3 IMPLANT
CUTTER BONE HARVERSTER 10MM (INSTRUMENTS) ×2 IMPLANT
DERMABOND ADVANCED (GAUZE/BANDAGES/DRESSINGS) ×2
DERMABOND ADVANCED .7 DNX12 (GAUZE/BANDAGES/DRESSINGS) ×1 IMPLANT
DRAPE C-ARM 42X72 X-RAY (DRAPES) ×6 IMPLANT
DRAPE POUCH INSTRU U-SHP 10X18 (DRAPES) ×3 IMPLANT
DRAPE SURG 17X23 STRL (DRAPES) ×25 IMPLANT
DRAPE TABLE COVER HEAVY DUTY (DRAPES) ×3 IMPLANT
DRAPE U-SHAPE 47X51 STRL (DRAPES) ×5 IMPLANT
DRILL BIT MOUNTAINEER FIX 14MM (BIT) ×3
DRILL MOUNTAINEER FIXED 12MM (DRILL) ×3
DRSG ADAPTIC 3X8 NADH LF (GAUZE/BANDAGES/DRESSINGS) ×3 IMPLANT
DRSG MEPILEX BORDER 4X4 (GAUZE/BANDAGES/DRESSINGS) ×4 IMPLANT
DRSG MEPILEX BORDER 4X8 (GAUZE/BANDAGES/DRESSINGS) ×3 IMPLANT
DURAPREP 26ML APPLICATOR (WOUND CARE) ×3 IMPLANT
ELECT BLADE 4.0 EZ CLEAN MEGAD (MISCELLANEOUS)
ELECT BLADE 6.5 EXT (BLADE) ×3 IMPLANT
ELECT CAUTERY BLADE 6.4 (BLADE) ×3 IMPLANT
ELECT REM PT RETURN 9FT ADLT (ELECTROSURGICAL) ×3
ELECTRODE BLDE 4.0 EZ CLN MEGD (MISCELLANEOUS) IMPLANT
ELECTRODE REM PT RTRN 9FT ADLT (ELECTROSURGICAL) ×1 IMPLANT
EVACUATOR 1/8 PVC DRAIN (DRAIN) IMPLANT
GLOVE BIOGEL PI IND STRL 8 (GLOVE) ×1 IMPLANT
GLOVE BIOGEL PI IND STRL 8.5 (GLOVE) ×1 IMPLANT
GLOVE BIOGEL PI INDICATOR 8 (GLOVE) ×6
GLOVE BIOGEL PI INDICATOR 8.5 (GLOVE) ×2
GLOVE ECLIPSE 8.5 STRL (GLOVE) ×7 IMPLANT
GLOVE ORTHO TXT STRL SZ7.5 (GLOVE) ×5 IMPLANT
GOWN STRL NON-REIN LRG LVL3 (GOWN DISPOSABLE) ×1 IMPLANT
GOWN STRL REIN 2XL XLG LVL4 (GOWN DISPOSABLE) ×1 IMPLANT
GOWN STRL REUS W/TWL 2XL LVL3 (GOWN DISPOSABLE) ×9 IMPLANT
IV CATH 14GX2 1/4 (CATHETERS) IMPLANT
KIT BASIN OR (CUSTOM PROCEDURE TRAY) ×3 IMPLANT
KIT POSITION SURG JACKSON T1 (MISCELLANEOUS) ×3 IMPLANT
KIT ROOM TURNOVER OR (KITS) ×3 IMPLANT
NEEDLE 22X1 1/2 (OR ONLY) (NEEDLE) ×3 IMPLANT
NEURO MONITORING STIM (LABOR (TRAVEL & OVERTIME)) ×2 IMPLANT
NS IRRIG 1000ML POUR BTL (IV SOLUTION) ×3 IMPLANT
PACK LAMINECTOMY ORTHO (CUSTOM PROCEDURE TRAY) ×3 IMPLANT
PACK UNIVERSAL I (CUSTOM PROCEDURE TRAY) ×5 IMPLANT
PAD ARMBOARD 7.5X6 YLW CONV (MISCELLANEOUS) ×6 IMPLANT
PATTIES SURGICAL .5 X.5 (GAUZE/BANDAGES/DRESSINGS) IMPLANT
PATTIES SURGICAL .5 X1 (DISPOSABLE) ×3 IMPLANT
PIN DISTRACTION 14 (PIN) ×4 IMPLANT
PLATE 2LVL UNIPLATE 28MM (Plate) ×2 IMPLANT
ROD MOUNTAINEER 3.5X60 (Rod) ×2 IMPLANT
SCREW F A 3.5X12 (Screw) ×2 IMPLANT
SCREW F A 3.5X14 (Screw) ×6 IMPLANT
SCREW INNER (Screw) ×8 IMPLANT
SCREW SELF DRILL UNIPLATE 13MM (Screw) ×2 IMPLANT
SPONGE GAUZE 4X4 12PLY STER LF (GAUZE/BANDAGES/DRESSINGS) ×2 IMPLANT
SPONGE INTESTINAL PEANUT (DISPOSABLE) ×6 IMPLANT
SPONGE LAP 4X18 X RAY DECT (DISPOSABLE) ×6 IMPLANT
SPONGE SURGIFOAM ABS GEL 100 (HEMOSTASIS) ×3 IMPLANT
STRIP CLOSURE SKIN 1/2X4 (GAUZE/BANDAGES/DRESSINGS) IMPLANT
SURGIFLO TRUKIT (HEMOSTASIS) IMPLANT
SURGILUBE 2OZ TUBE FLIPTOP (MISCELLANEOUS) IMPLANT
SUT BONE WAX W31G (SUTURE) ×4 IMPLANT
SUT ETHILON 3 0 FSL (SUTURE) IMPLANT
SUT MNCRL AB 4-0 PS2 18 (SUTURE) ×2 IMPLANT
SUT PDS AB 1 CTX 36 (SUTURE) ×5 IMPLANT
SUT PROLENE 0 CT (SUTURE) ×3 IMPLANT
SUT PROLENE 2 0 CT2 30 (SUTURE) ×3 IMPLANT
SUT VIC AB 0 CTB1 27 (SUTURE) ×6 IMPLANT
SUT VIC AB 0 UR5 27 (SUTURE) ×2 IMPLANT
SUT VIC AB 1 CT1 27 (SUTURE) ×3
SUT VIC AB 1 CT1 27XBRD ANBCTR (SUTURE) IMPLANT
SUT VIC AB 1 CTX 36 (SUTURE) ×6
SUT VIC AB 1 CTX36XBRD ANBCTR (SUTURE) ×2 IMPLANT
SUT VIC AB 2-0 CT1 18 (SUTURE) ×3 IMPLANT
SYR BULB IRRIGATION 50ML (SYRINGE) ×3 IMPLANT
SYR CONTROL 10ML LL (SYRINGE) ×3 IMPLANT
TAPE CLOTH SURG 6X10 WHT LF (GAUZE/BANDAGES/DRESSINGS) ×2 IMPLANT
TAPE UMBILICAL COTTON 1/8X30 (MISCELLANEOUS) ×2 IMPLANT
TOWEL OR 17X24 6PK STRL BLUE (TOWEL DISPOSABLE) ×3 IMPLANT
TOWEL OR 17X26 10 PK STRL BLUE (TOWEL DISPOSABLE) ×3 IMPLANT
TRAY FOLEY CATH 16FRSI W/METER (SET/KITS/TRAYS/PACK) ×3 IMPLANT
WATER STERILE IRR 1000ML POUR (IV SOLUTION) ×3 IMPLANT
YANKAUER SUCT BULB TIP NO VENT (SUCTIONS) ×3 IMPLANT

## 2013-04-16 NOTE — Progress Notes (Signed)
Called by PACU nurse concerning ongoing right sided weakness Exam improved since immediate post-op - but there is weakness  Neuro exam: PEARLA, no facial asymmetry, no tongue deviation, no slurred/altered speech Negative babinski/clonus/hoffman signs 2/5 deltoid/bicep right side 3/5 tricep and grip right side (chronic right foot drop) Quad weakness (3/5) right side  Left - 5/5 throughout (UE/LE) Sensation in UE - decreased to LT ( subjectively worse compared to pre-op)  Plan:  question as to unilateral nature of neuro exam  Patient without gross signs of stroke  No abnormal cord function during case based on evoked motor and SSEP monitoring  Wound soft/NT no evidence of swelling of hematoma formation - drain functioning  Will get stat CT scan to eval corpectomy cage  Step down unit for observation  If CT unremarkable will request neuro/stroke eval.    Will start decadron to decease potential inflammation/edema secondary to surgery

## 2013-04-16 NOTE — H&P (Signed)
No change in clinical exam H+P reviewed  

## 2013-04-16 NOTE — Anesthesia Preprocedure Evaluation (Addendum)
Anesthesia Evaluation  Patient identified by MRN, date of birth, ID band Patient awake    Reviewed: Allergy & Precautions, H&P , NPO status , Patient's Chart, lab work & pertinent test results, reviewed documented beta blocker date and time   History of Anesthesia Complications (+) PONV and history of anesthetic complications  Airway Mallampati: II TM Distance: >3 FB Neck ROM: Full  Mouth opening: Limited Mouth Opening Comment: No worsening of symptoms with neck movement Dental  (+) Teeth Intact   Pulmonary neg shortness of breath, asthma , neg sleep apnea, neg recent URI,    Pulmonary exam normal       Cardiovascular hypertension, Pt. on medications and Pt. on home beta blockers - angina- CAD, - Cardiac Stents and - CHF - Valvular Problems/MurmursRhythm:Regular Rate:Normal     Neuro/Psych  Headaches, PSYCHIATRIC DISORDERS Anxiety Depression Chronic neck pain with weakness and numbness in hand always present  Neuromuscular disease    GI/Hepatic Neg liver ROS, GERD-  ,ibs   Endo/Other  negative endocrine ROS  Renal/GU negative Renal ROS  negative genitourinary   Musculoskeletal   Abdominal   Peds  Hematology negative hematology ROS (+)   Anesthesia Other Findings   Reproductive/Obstetrics                         Anesthesia Physical Anesthesia Plan  ASA: III  Anesthesia Plan: General   Post-op Pain Management:    Induction: Intravenous  Airway Management Planned: Oral ETT  Additional Equipment: None  Intra-op Plan:   Post-operative Plan: Post-operative intubation/ventilation  Informed Consent: I have reviewed the patients History and Physical, chart, labs and discussed the procedure including the risks, benefits and alternatives for the proposed anesthesia with the patient or authorized representative who has indicated his/her understanding and acceptance.   Dental advisory  given  Plan Discussed with: CRNA and Surgeon  Anesthesia Plan Comments:        Anesthesia Quick Evaluation

## 2013-04-16 NOTE — Transfer of Care (Signed)
Immediate Anesthesia Transfer of Care Note  Patient: [name]  Procedure(s) Performed: Procedure(s): REMOVAL CERVICAL PLATES AND INTERBODY CAGE/POSTERIOR CERVICAL SPINAL FUSION C4 - C6/C5 CORPECTOMY/C4 - C6 FUSION WITH ILIAC CREST BONE GRAFT (N/A)  Patient Location: PACU  Anesthesia Type:General  Level of Consciousness: awake, alert  and oriented  Airway & Oxygen Therapy: Patient Spontanous Breathing and Patient connected to nasal cannula oxygen  Post-op Assessment: Report given to PACU RN, Post -op Vital signs reviewed and stable and Patient moving all extremities X 4  Post vital signs: Reviewed and stable  Complications: No apparent anesthesia complications

## 2013-04-16 NOTE — Anesthesia Procedure Notes (Signed)
Procedure Name: Intubation Date/Time: 04/16/2013 8:45 AM Performed by: Kyung Rudd Pre-anesthesia Checklist: Patient identified, Emergency Drugs available, Suction available, Patient being monitored and Timeout performed Patient Re-evaluated:Patient Re-evaluated prior to inductionOxygen Delivery Method: Circle system utilized Preoxygenation: Pre-oxygenation with 100% oxygen Intubation Type: IV induction Ventilation: Mask ventilation without difficulty Laryngoscope Size: Mac and 3 Grade View: Grade II Tube type: Oral Tube size: 7.0 mm Number of attempts: 1 Airway Equipment and Method: Stylet Placement Confirmation: ETT inserted through vocal cords under direct vision,  positive ETCO2 and breath sounds checked- equal and bilateral Secured at: 22 cm Tube secured with: Tape Dental Injury: Teeth and Oropharynx as per pre-operative assessment

## 2013-04-16 NOTE — OR Nursing (Signed)
[name] is still 10/10 pain. Dr. Glennon Mac at bedside.  More dilaudid ordered and given.

## 2013-04-16 NOTE — Brief Op Note (Signed)
04/16/2013  4:19 PM  PATIENT:  [name]  53 y.o. female  PRE-OPERATIVE DIAGNOSIS:  pseudoarthrosis with hardware failure  POST-OPERATIVE DIAGNOSIS:  pseudoarthrosis with hardware failure  PROCEDURE:  Procedure(s): REMOVAL CERVICAL PLATES AND INTERBODY CAGE/POSTERIOR CERVICAL SPINAL FUSION C4 - C6/C5 CORPECTOMY/C4 - C6 FUSION WITH ILIAC CREST BONE GRAFT (N/A)  SURGEON:  Surgeon(s) and Role:    * Melina Schools, MD - Primary  PHYSICIAN ASSISTANT:   ASSISTANTS: Benjiman Core    ANESTHESIA:   general  EBL:  Total I/O In: 2500 [I.V.:2500] Out: 3100 [Urine:2800; Blood:300]  BLOOD ADMINISTERED:none  DRAINS: Penrose drain in the anterior cervical spine   LOCAL MEDICATIONS USED:  MARCAINE     SPECIMEN:  No Specimen  DISPOSITION OF SPECIMEN:  N/A  COUNTS:  YES  TOURNIQUET:  * No tourniquets in log *  DICTATION: .Other Dictation: Dictation Number [id number]  PLAN OF CARE: Admit to inpatient   PATIENT DISPOSITION:  PACU - hemodynamically stable.

## 2013-04-16 NOTE — OR Nursing (Signed)
Pt transported to CT ?

## 2013-04-16 NOTE — Anesthesia Postprocedure Evaluation (Signed)
Anesthesia Post Note  Patient: [name]  Procedure(s) Performed: Procedure(s) (LRB): REMOVAL CERVICAL PLATES AND INTERBODY CAGE/POSTERIOR CERVICAL SPINAL FUSION C4 - C6/C5 CORPECTOMY/C4 - C6 FUSION WITH ILIAC CREST BONE GRAFT (N/A)  Anesthesia type: General  Patient location: PACU  Post pain: Pain level controlled and Adequate analgesia  Post assessment: Post-op Vital signs reviewed, Patient's Cardiovascular Status Stable, Respiratory Function Stable, Patent Airway and Pain level controlled  Last Vitals:  Filed Vitals:   04/16/13 1800  BP:   Pulse: 69  Temp:   Resp: 15    Post vital signs: Reviewed and stable  Level of consciousness: awake, alert  and oriented  Complications: No apparent anesthesia complications

## 2013-04-16 NOTE — Preoperative (Signed)
Beta Blockers   Reason not to administer Beta Blockers:Not Applicable 

## 2013-04-16 NOTE — Progress Notes (Signed)
Spoke with radiologist on call concerning CT scan No evidence of cord compression/hardware complication Recommend MRI to further evaluate cord if concern is present. I have ordered the stat MRI and the radiologist will contact me directly when it is completed to review.

## 2013-04-17 ENCOUNTER — Inpatient Hospital Stay (HOSPITAL_COMMUNITY): Payer: Medicare Other

## 2013-04-17 LAB — CBC
HCT: 35.6 % — ABNORMAL LOW (ref 36.0–46.0)
Hemoglobin: 12.2 g/dL (ref 12.0–15.0)
MCH: 30.1 pg (ref 26.0–34.0)
MCHC: 34.3 g/dL (ref 30.0–36.0)
MCV: 87.9 fL (ref 78.0–100.0)
PLATELETS: 286 10*3/uL (ref 150–400)
RBC: 4.05 MIL/uL (ref 3.87–5.11)
RDW: 13.2 % (ref 11.5–15.5)
WBC: 15.2 10*3/uL — ABNORMAL HIGH (ref 4.0–10.5)

## 2013-04-17 LAB — BASIC METABOLIC PANEL
BUN: 9 mg/dL (ref 6–23)
CHLORIDE: 100 meq/L (ref 96–112)
CO2: 22 meq/L (ref 19–32)
Calcium: 9.2 mg/dL (ref 8.4–10.5)
Creatinine, Ser: 0.66 mg/dL (ref 0.50–1.10)
GFR calc Af Amer: >90 mL/min (ref >90)
GFR calc non Af Amer: >90 mL/min (ref >90)
Glucose, Bld: 201 mg/dL — ABNORMAL HIGH (ref 70–99)
Potassium: 3.9 mEq/L (ref 3.7–5.3)
SODIUM: 140 meq/L (ref 137–147)

## 2013-04-17 MED ORDER — ACETAMINOPHEN 500 MG PO TABS
1000.0000 mg | ORAL_TABLET | Freq: Four times a day (QID) | ORAL | Status: AC
  Administered 2013-04-17 (×2): 1000 mg via ORAL
  Filled 2013-04-17 (×2): qty 2

## 2013-04-17 MED ORDER — WHITE PETROLATUM GEL
Status: AC
  Administered 2013-04-17: 1
  Filled 2013-04-17: qty 5

## 2013-04-17 NOTE — Progress Notes (Signed)
Orthopedic Tech Progress Note Patient Details:  [name] 1960/10/15 [id number] Checked on patient in room; patient has aspen collar already. No action needed from Ortho Tech at this time. Patient ID: [name], female   DOB: May 22, 1960, 53 y.o.   MRN: [id number]   [name] 04/17/2013, 11:22 AM

## 2013-04-17 NOTE — Op Note (Signed)
[name], [name]             ACCOUNT NO.:  [id number]  MEDICAL RECORD NO.:  [id number]  LOCATION:  [id number]                        FACILITY:  [place] [facility]  PHYSICIAN:  Dahlia Bailiff, MD    DATE OF BIRTH:  [birth year]  DATE OF PROCEDURE:  04/16/2013 DATE OF DISCHARGE:                              OPERATIVE REPORT   PREOPERATIVE DIAGNOSIS:  Failed anterior cervical hardware.  POSTOPERATIVE DIAGNOSIS:  Failed anterior cervical hardware.  OPERATIVE PROCEDURES: 1. Removal of the anterior cervical plate and intervertebral cage, C5-     6. 2. C5 corpectomy. 3. Anterior cervical instrumented fusion with corpectomy cage, C4-6. 4. Posterior lateral mass screw fixation, C4-C6, and then both     anterior and posterior iliac crest bone graft harvest for     cancellous bone graft harvest.  FIRST ASSISTANT:  Alyson Locket. Ricard Dillon, PA-C  HISTORY:  [name], [name]  Her postoperative course was complicated by trauma and as a result, the C5 screws cut through the superior endplate of C5 and into the C4-5 disc space.  They were also starting to back up potentially causing dysphagia.  As a result of the hardware complication and the increasing pain, I elected to proceed with revision surgery.  I discussed all the risks, benefits, and alternatives with the patient and consent was obtained.  Complications include the left lateral mass screw was could not be placed.  There was a lateral blow out, although lateral wall of the lateral mass blew out __________ for the screw.  INSTRUMENTATION SYSTEM USED:  Anteriorly was the DePuy bengal carbon fiber cage 16 mm lordotic with a Uniplate 28 mm in length with 13 mm self-drilling screws x2.  Posterior, we used on the right side, 14 mm lateral mass near DePuy screws at C4 and C6, and on the right side at C5, there was a 3.5 x 12 and C6 3.5 x 14.  No screw could be placed at the C4 left lateral  mass.  OPERATIVE NOTE:  The patient was brought to the operating room, placed supine on the operating table.  After successful induction of general anesthesia and endotracheal intubation, TEDs, SCDs and a Foley were inserted.  The NeuroStim monitor representative placed all appropriate needles for intraoperative SSEP and motor evoked potentials and free running EMGs.  The patient was then placed supine on the operating room table.  The arms were tucked at the side.  The anterior cervical spine and the left iliac crest were prepped out.  Time-out was done confirming the patient, procedure, and all other pertinent important data.  The decision was made preoperatively to use the previous right-sided approach.  I re-incised this longitudinal incision, and then sharply dissected down to the platysma.  The platysma was sharply incised, and I carefully dissected through the scar tissue __________ I eventually could palpate the carotid artery.  I then protected the carotid sheath with my finger and then continued sharply dissecting, sweeping the esophagus to the left.  A self-retaining retractor was placed to hold the esophagus and I continued to protect the carotid sheath with my finger.  I could now visualize the anterior  cervical spine.  Using [employer], I mobilized the prevertebral fascia to expose the anterior cervical plate.  The screws had in fact cut-out of C5 and were in the C4-5 disc space.  Once I had the entire plate exposed, I removed the anterior cervical plate.  The cage itself was just seen to have be fixed to the C6 vertebral body, but there was a large gapping space between the C4 endplate and the Titan intervertebral cage that was placed prior.  At this point, because of the defect, I elected to remove the Titan intervertebral cage and complete the C5 corpectomy.  Using curettes, I mobilized the Titan cage and eventually removed it.  I then placed self-retaining  retractors into the wound, deflated the endotracheal cuff, expanded the retractors, and reinflated the cuff.  I then began removing the remaining portion of the C4-5 disc using curettes and pituitary rongeurs.  At this point, I then used a high- speed bur to bur down the remaining portion __________ of the posterior portion of the vertebral body of C5.  I also used a curette to remove the fibrous tissue that had accumulated on the superior endplate of C6. After carefully removing the bulk of the C5 vertebral body, I could easily fit the bengal corpectomy cage in place.  Since there was no significant further stenosis, I did not feel the need to completely excise the posterior vertebral body.  Bleeding was well controlled with bipolar electrocautery.  Once I had completed the C4-5 discectomy and I undercut the uncovertebral joints to help reduce some of the nerve irritation, I then turned my attention to the iliac crest.  The left iliac crest was palpated and a small incision was made over the iliac crest.  I sharply dissected down to the iliac crest and then placed a bone filling device.  I harvested adequate amount of cancellous bone, and then packed it into the bengal size 16 corpectomy __________ carbon fiber cage.  I had an excellent fill.  I then went back to the cervical spine.  I irrigated the wound copiously with normal saline and then I again checked __________ adequate corpectomy channel.  I then malleted the cage into place and was well fitting.  Because of the previous plate and the screws cutting out and the fact that she had a previous C6-7 ACDF and had screw holes there, I elected to use the Uniplate.  This allowed me to place the screws in the distraction pinholes that I had placed while doing the decompression.  It also allowed me to stabilize the cage and decrease the likelihood of an anterior translation.  Final x-rays were satisfactory.  SSEPs and evoked motor testing  were normal throughout the case.  At this point in time with the anterior portion complete, I irrigated both wounds and then closed the deep fascia of the iliac crest site with 0 Vicryl sutures, then a layer of 2-0 Vicryl sutures, and a 3-0 Monocryl.  The anterior cervical spine was irrigated copiously with normal saline.  I obtained hemostasis using bipolar electrocautery. Because of the fact this was revision, I did place a drain.  I then closed the platysma with interrupted 2-0 Vicryl sutures over the drain and then a 3-0 Monocryl for the skin.  Steri-Strips and a dry dressing were applied.  At this point, the drapes were removed and the spine frame was secured over the patient.  She was then rotated into the prone position, and the arms were tucked  at the side.  Again, the posterior cervical spine was prepped and draped as was the right posterior iliac crest.  Once the prep and drape was completed, I infiltrated the proposed midline skin site, incision site, and then made an incision starting just above the C4 spinous process and proceeding down just to the C7 spinous process.  Sharp dissection was carried out down to the deep fascia.  I incised the deep fascia and maintained my strict midline position.  Using a Cobb and Bovie, I exposed the C4, C5, and C6 spinous process and lamina and facet complex.  I removed the facet capsule at 4- 5 and 5-6.  Once I had this bilaterally, I then proceeded with placing the right lateral mass screws.  Using the standard __________ technique, I started in the inferior medial quadrant with my awl, then I placed a drill and aimed in a cephalad lateral direction.  I then tapped and palpated the hole and then placed a 14 mm screw.  The C5 on the right side was not well developed and did not feel like we could control or hold a screw and so I skipped this thing.  I then used the same technique to place the C6 lateral mass screw .  Again, the hole  tapped well and there was no breach with a ball-tip Feeler.  With the right side complete, I went to the contralateral side to prepare the left.  At this time, I started C6.  I used all screw ball-tip Feeler tapped, ball- tip Feeler, and then placed a screw.  I repeated this at C5.  The left C5 was better developed and actually was easier to place the screw.  At C4, when I was using the same technique __________ I was placing the screw and the lateral mass fractured.  At this point, I removed the screw and I elected not to leave it since because of the fracture.  At this point, I used a high-speed burr to decorticate the facet joints as well as the spinous process.  I then turned my attention to the iliac crest.  Again using the same technique, I made an incision, dissected down to the iliac crest and then used the quick harvesting iliac crest system to harvest bone graft. Once I had an adequate amount of bone graft, I placed it into the posterior aspect of the cervical spine from C4-C6.  I then contoured 2 rods and secured them.  On the left side, we had fixation at C5 and C6. On the right side, we had fixation at C4 and C6.  All screws had excellent purchase.  Final x-rays were satisfactory.  I irrigated the wound copiously with normal saline, closed in a layered fashion with interrupted #1 Vicryl sutures, and a layer of 0 Vicryl runner and then vertical interrupted mattress sutures with PDS.  I irrigated out the iliac crest bone site and closed it with 0 Vicryl suture, 2-0 Vicryl suture, and 3-0 Monocryl.  At the end of the case, the patient was ultimately extubated and she was moving all 4 extremities.  The patient will be admitted to the hospital most likely for 2, possibly 3 nights.  We will begin physical therapy with ambulation tomorrow.  At the end of the case, all needle and sponge counts were correct.  There was no adverse neurological events as the motor and sensory  potential monitoring remained normal throughout the case.     Alvy Beal, MD  DDB/MEDQ  D:  04/16/2013  T:  04/17/2013  Job:  [id number]

## 2013-04-17 NOTE — Progress Notes (Signed)
Spoke with radiologist early the AM Reviewed MRI of c-spine No evidence of cord compression/deviation No signal changes noted  Will continue to monitor clinical exam

## 2013-04-17 NOTE — Progress Notes (Signed)
Rehab Admissions Coordinator Note:  Patient was screened by Retta Diones for appropriateness for an Inpatient Acute Rehab Consult.  At this time, we are recommending Inpatient Rehab consult.  Jodell Cipro M 04/17/2013, 1:23 PM  I can be reached at [phone].

## 2013-04-17 NOTE — Progress Notes (Signed)
    Subjective: Procedure(s) (LRB): REMOVAL CERVICAL PLATES AND INTERBODY CAGE/POSTERIOR CERVICAL SPINAL FUSION C4 - C6/C5 CORPECTOMY/C4 - C6 FUSION WITH ILIAC CREST BONE GRAFT (N/A) 1 Day Post-Op  Patient reports pain as 6 on 0-10 scale.  Reports unchanged arm pain reports incisional neck pain   Positive void Negative bowel movement Positive flatus Negative chest pain or shortness of breath  Objective: Vital signs in last 24 hours: Temp:  [97.4 F (36.3 C)-98.9 F (37.2 C)] 98.9 F (37.2 C) (02/05 0800) Pulse Rate:  [59-90] 90 (02/05 1056) Resp:  [11-23] 11 (02/05 0325) BP: (105-139)/(63-87) 117/70 mmHg (02/05 1057) SpO2:  [94 %-100 %] 97 % (02/05 0941) FiO2 (%):  [21 %] 21 % (02/05 0400) Weight:  [62.4 kg (137 lb 9.1 oz)] 62.4 kg (137 lb 9.1 oz) (02/04 2300)  Intake/Output from previous day: 02/04 0701 - 02/05 0700 In: 4205 [I.V.:3705] Out: 4005 [Urine:3150; Drains:555; Blood:300]  Labs: No results found for this basename: WBC, RBC, HCT, PLT,  in the last 72 hours No results found for this basename: NA, K, CL, CO2, BUN, CREATININE, GLUCOSE, CALCIUM,  in the last 72 hours No results found for this basename: LABPT, INR,  in the last 72 hours  Physical Exam: Incision: dressing C/D/I No cellulitis present Compartment soft UE/LE strength improved.  now 3/5 strength  Assessment/Plan: Patient stable  xrays satusfactory Mobilization with physical therapy Encourage incentive spirometry Continue care  Advance diet Up with therapy D/C IV fluids Will check labs today Plan on transfer to floor Friday   Melina Schools, [place] [phone]

## 2013-04-17 NOTE — Evaluation (Addendum)
Occupational Therapy Evaluation Patient Details Name: [name] MRN: [id number] DOB: November 04, 1960 Today's Date: 04/17/2013 Time: 9509-3267 OT Time Calculation (min): 41 min  OT Assessment / Plan / Recommendation History of present illness Pt admitted for removal of cervical hardware and collapsed cage in neck.  Pt underwent a C4-C6 fusion with C5 corpectomy.   Pt with post op R side weakness in UE and LE.  Pt did have preop foot drop in R foot but otherwise strength was [facility].  Pt has had R rotator cuff surgery, R hip replacement, R carpel tunnel surgery and was waiting to have L rotator cuff surgery.    Clinical Impression   Pt admitted for the above cervical surgery and has the deficits listed below. Pt with new R side weakness since surgery and L side mouth numbness (? Tube from intubation) which is slowly improving.  Pt would benefit from cont OT to increase I with basic adls and increase strength in RUE so she can dc home with family.  Depending on how quickly her strength returns, pt may benefit from CIR.     OT Assessment  Patient needs continued OT Services    Follow Up Recommendations  CIR;Supervision/Assistance - 24 hour;Other (comment);Outpatient OT (depends on how pt progresses with new R weakness)    Barriers to Discharge      Equipment Recommendations       Recommendations for Other Services Rehab consult  Frequency  Min 3X/week    Precautions / Restrictions Precautions Precautions: Cervical;Fall Required Braces or Orthoses: Other Brace/Splint;Cervical Brace Cervical Brace: Hard collar;At all times Other Brace/Splint: had brace for R foot drop but brace was combersome and "broke" and pt has not worn the brace since then (4 mos ago).  Pt feels she falls more now that she does not have the brace and that a fall caused her neck problem.   Restrictions Weight Bearing Restrictions: No   Pertinent Vitals/Pain BP 117/70.  HR 88 and O2 97%.  Pt with 10/10 pain.  Nursing  notified.    ADL  Eating/Feeding: Performed;Moderate assistance;Other (comment) (mod assist to use RUE...set up for LUE) Where Assessed - Eating/Feeding: Bed level Grooming: Performed;Wash/dry face;Wash/dry hands;Teeth care;Minimal assistance Where Assessed - Grooming: Supported sitting Upper Body Bathing: Simulated;Minimal assistance Where Assessed - Upper Body Bathing: Unsupported sitting Lower Body Bathing: Simulated;Maximal assistance Where Assessed - Lower Body Bathing: Supported sit to stand Upper Body Dressing: Simulated;Moderate assistance Where Assessed - Upper Body Dressing: Unsupported sitting Lower Body Dressing: Performed;Maximal assistance Where Assessed - Lower Body Dressing: Supported sit to stand Toilet Transfer: Performed;Moderate assistance Toilet Transfer Method: Stand pivot Toilet Transfer Equipment: Bedside commode Toileting - Clothing Manipulation and Hygiene: Simulated;Maximal assistance Where Assessed - Toileting Clothing Manipulation and Hygiene: Standing Transfers/Ambulation Related to ADLs: Pt transferred sit to stand with mod assist (min assist on last attempt).  Pt in a great amount of pain so does not tolerate standing for very long.  Did not walk with pt.  Will defer to PT. ADL Comments: Pt requires a great amount of assist with adls at this time due to R side weakness, pain and R leg weakness making ambulation difficult.    OT Diagnosis: Generalized weakness;Hemiplegia dominant side;Acute pain  OT Problem List: Decreased strength;Decreased range of motion;Decreased activity tolerance;Impaired balance (sitting and/or standing);Decreased coordination;Decreased knowledge of use of DME or AE;Impaired sensation;Impaired UE functional use;Pain OT Treatment Interventions: Self-care/ADL training;Therapeutic exercise;Neuromuscular education;DME and/or AE instruction;Therapeutic activities   OT Goals(Current goals can be found in the  care plan section) Acute Rehab  OT Goals Patient Stated Goal: to get my strength back. OT Goal Formulation: With patient Time For Goal Achievement: 05/01/13 Potential to Achieve Goals: Good ADL Goals Pt Will Perform Eating: with set-up;with adaptive utensils;sitting Pt Will Perform Grooming: with set-up;standing;with adaptive equipment Pt Will Perform Lower Body Dressing: with min assist;with adaptive equipment;sit to/from stand Pt Will Transfer to Toilet: ambulating;with min guard assist;regular height toilet Pt Will Perform Toileting - Clothing Manipulation and hygiene: with min assist;sit to/from stand Pt/caregiver will Perform Home Exercise Program: Right Upper extremity;Independently Additional ADL Goal #1: Pt will use RUE during all adls without VCs to attempt to increase functional use of that hand.  Visit Information  Last OT Received On: 04/17/13 Assistance Needed: +1 History of Present Illness: Pt admitted for removal of cervical hardware and collapsed cage in neck.  Pt underwent a C4-C6 fusion with C5 corpectomy.   Pt with post op R side weakness in UE and LE.  Pt did have preop foot drop in R foot but otherwise strength was [facility].  Pt has had R rotator cuff surgery, R hip replacement, R carpel tunnel surgery and was waiting to have L rotator cuff surgery.        Prior Bailey expects to be discharged to:: Private residence Living Arrangements: Spouse/significant other;Other relatives Available Help at Discharge: Family;Friend(s);Available 24 hours/day Type of Home: House Home Access: Stairs to enter [employer] of Steps: 5 Entrance Stairs-Rails: Right;Left Home Layout: Two level;Able to live on main level with bedroom/bathroom Home Equipment: Gilford Rile - 2 wheels;Cane - single point;Bedside commode;Shower seat Additional Comments: used AE after back surgery: boyfriend dons socks Prior Function Level of Independence: Needs assistance Gait / Transfers  Assistance Needed: has cane and walker but did not use as much as she should have ADL's / Homemaking Assistance Needed: needed assist with socks and shoes.  Family assists with home skills. Communication / Swallowing Assistance Needed: pt has had trouble swallowing since recent fall when cage in neck collapsed.  Cont. to have trouble swallowing per pt report. Comments: Pt has reacher she uses to pick things up on the floor. Communication Communication: No difficulties Dominant Hand: Right         Vision/Perception Vision - History Baseline Vision: No visual deficits Patient Visual Report: No change from baseline Vision - Assessment Vision Assessment: Vision not tested Perception Perception: Within Functional Limits Praxis Praxis: Intact   Cognition  Cognition Arousal/Alertness: Awake/alert Behavior During Therapy: WFL for tasks assessed/performed Overall Cognitive Status: Within Functional Limits for tasks assessed    Extremity/Trunk Assessment Upper Extremity Assessment Upper Extremity Assessment: RUE deficits/detail RUE Deficits / Details: ROM WFL.  Strength:  deltoids 3+/5, biceps 3+/5, triceps 3+/5, wrist extension 3/5, wrist flexion 3/5, grip 3+/5. RUE Sensation: decreased light touch;decreased proprioception RUE Coordination: decreased fine motor;decreased gross motor Lower Extremity Assessment Lower Extremity Assessment: Defer to PT evaluation     Mobility Bed Mobility Overal bed mobility: Needs Assistance Bed Mobility: Supine to Sit;Sit to Supine Supine to sit: Min assist Sit to supine: Min assist General bed mobility comments: Pt has a lot of pain with bed mobilty but does well with it. Transfers Overall transfer level: Needs assistance Equipment used: 1 person hand held assist Transfers: Sit to/from [facility] Sit to Stand: Min assist Stand pivot transfers: Mod assist General transfer comment: Pt's RLE buckles during ambulation.      Exercise General Exercises - Upper Extremity Shoulder  Flexion: AROM;5 reps;Right;Supine;Other (comment) (just to 90 degrees) Elbow Flexion: AROM;Right;5 reps;Supine Elbow Extension: AROM;Right;5 reps;Supine Wrist Flexion: AROM;5 reps;Right;Supine Wrist Extension: AROM;Right;5 reps;Supine Digit Composite Flexion: AROM;Right;5 reps;Supine Composite Extension: AROM;Right;5 reps;Supine   Balance Balance Overall balance assessment: Needs assistance Sitting-balance support: Bilateral upper extremity supported;Feet supported Sitting balance-Leahy Scale: Good Standing balance support: Bilateral upper extremity supported;During functional activity Standing balance-Leahy Scale: Poor Standing balance comment: Pt cannot support self standing and does not tolerate standing very long b/c of pain. General Comments General comments (skin integrity, edema, etc.): Pt very limited due to pain.  Pt very weak in R side.  Pt was functioning with very little help (although in pain) before surgery and now will need significant assist at home depending on how she progresses.   End of Session OT - End of Session Equipment Utilized During Treatment: Cervical collar Activity Tolerance: Patient limited by fatigue;Patient limited by pain Patient left: in bed;with call bell/phone within reach Nurse Communication: Mobility status;Patient requests pain meds  GO     Glenford Peers 04/17/2013, 11:49 AM [phone]

## 2013-04-17 NOTE — Evaluation (Addendum)
Physical Therapy Evaluation Patient Details Name: [name] MRN: [id number] DOB: February 12, 1961 Today's Date: 04/17/2013 Time: 3382-5053 PT Time Calculation (min): 27 min  PT Assessment / Plan / Recommendation History of Present Illness  Pt admitted for removal of cervical hardware and collapsed cage in neck.  Pt underwent a C4-C6 fusion with C5 corpectomy.   Pt with post op R side weakness in UE and LE.  Pt did have preop foot drop in R foot but otherwise strength was [name].  Pt has had R rotator cuff surgery, R hip replacement, R carpel tunnel surgery and was waiting to have L rotator cuff surgery.   Clinical Impression  Patient is s/p C4-C6 fusion with C5 corpectomy with removal of cervical hardware and collapsed cage surgery resulting in the deficits listed below (see PT Problem List). Patient will benefit from skilled PT to increase their independence and safety with mobility (while adhering to their precautions) to allow discharge home with husband. Pt slightly unsteady with gt, primarily due to pain and decreased strength in Rt LE. Pt to benefit from new AFO to prevent foot drop and reduce risk of falls. Phone call placed to MD requesting order and [facility] notified.    PT Assessment  Patient needs continued PT services    Follow Up Recommendations  No PT follow up;Supervision/Assistance - 24 hour (may benefit from OPPT as appropriate by MD for high level balance activities )     Does the patient have the potential to tolerate intense rehabilitation      Barriers to Discharge        Equipment Recommendations  None recommended by PT    Recommendations for Other Services     Frequency Min 5X/week    Precautions / Restrictions Precautions Precautions: Cervical;Fall Precaution Comments: pt reports multiple recent falls at home; pt given cerviical precautions handout and reviewed thorughly  Required Braces or Orthoses: Other Brace/Splint;Cervical Brace Cervical Brace:  Hard collar;At all times Other Brace/Splint: had brace for R foot drop but brace was combersome and "broke" and pt has not worn the brace since then (4 mos ago).  Pt feels she falls more now that she does not have the brace and that a fall caused her neck problem.   Restrictions Weight Bearing Restrictions: No   Pertinent Vitals/Pain 7/10; patient repositioned for comfort       Mobility  Bed Mobility General bed mobility comments: not assessed pt in recliner and returned to recliner Transfers Overall transfer level: Needs assistance Equipment used: 1 person hand held assist Transfers: Sit to/from Stand;Stand Pivot Transfers Sit to Stand: Min assist Stand pivot transfers: Min assist General transfer comment: Pt intially transferred SPT to [place]; pt required min (A) to achieve upright posture and maintain balance; incr time due to pain  Ambulation/Gait Ambulation/Gait assistance: Min assist Ambulation Distance (Feet): 80 Feet Assistive device: Rolling walker (2 wheeled) Gait Pattern/deviations: Decreased stride length;Step-through pattern;Decreased dorsiflexion - right (pt with Rt hip hike to compensate for Rt foot drop) Gait velocity: decreased Gait velocity interpretation: Below normal speed for age/gender General Gait Details: pt ambulates with Rt hip hike to compensate for Rt foot drop; pt slightly unsteady; cues for sequencing and safety; limited due to pain and fatigue         PT Diagnosis: Generalized weakness;Acute pain;Abnormality of gait  PT Problem List: Decreased strength;Decreased activity tolerance;Decreased range of motion;Decreased balance;Decreased mobility;Decreased knowledge of use of DME;Pain PT Treatment Interventions: DME instruction;Gait training;Stair training;Functional mobility training;Therapeutic exercise;Therapeutic activities;Balance training;Neuromuscular  re-education;Patient/family education     PT Goals(Current goals can be found in the care plan  section) Acute Rehab PT Goals Patient Stated Goal: to be independent  PT Goal Formulation: With patient Time For Goal Achievement: 05/01/13 Potential to Achieve Goals: Good  Visit Information  Last PT Received On: 04/17/13 Assistance Needed: +1 History of Present Illness: Pt admitted for removal of cervical hardware and collapsed cage in neck.  Pt underwent a C4-C6 fusion with C5 corpectomy.   Pt with post op R side weakness in UE and LE.  Pt did have preop foot drop in R foot but otherwise strength was [facility].  Pt has had R rotator cuff surgery, R hip replacement, R carpel tunnel surgery and was waiting to have L rotator cuff surgery.        Prior [place] expects to be discharged to:: Private residence Living Arrangements: Spouse/significant other;Other relatives Available Help at Discharge: Family;Friend(s);Available 24 hours/day Type of Home: House Home Access: Stairs to enter [employer] of Steps: 5 Entrance Stairs-Rails: Right;Left Home Layout: Two level;Able to live on main level with bedroom/bathroom Home Equipment: Gilford Rile - 2 wheels;Cane - single point;Bedside commode;Shower seat Additional Comments: used AE after back surgery: boyfriend dons socks Prior Function Level of Independence: Needs assistance Gait / Transfers Assistance Needed: has cane and walker but did not use as much as she should have ADL's / Homemaking Assistance Needed: needed assist with socks and shoes.  Family assists with home skills. Communication / Swallowing Assistance Needed: pt has had trouble swallowing since recent fall when cage in neck collapsed.  Cont. to have trouble swallowing per pt report. Comments: Pt has reacher she uses to pick things up on the floor. Communication Communication: No difficulties Dominant Hand: Right    Cognition  Cognition Arousal/Alertness: Awake/alert Behavior During Therapy: WFL for tasks assessed/performed Overall  Cognitive Status: Within Functional Limits for tasks assessed    Extremity/Trunk Assessment Upper Extremity Assessment Upper Extremity Assessment: Defer to OT evaluation Lower Extremity Assessment Lower Extremity Assessment: RLE deficits/detail RLE Deficits / Details: knee 3-/5; hip 2+/5; DF 2+/5 RLE Sensation: decreased light touch Cervical / Trunk Assessment Cervical / Trunk Assessment: Normal   Balance Balance Overall balance assessment: History of Falls;Needs assistance Standing balance support: During functional activity;Bilateral upper extremity supported Standing balance-Leahy Scale: Fair Standing balance comment: pt tolerated standing ~3 min to perform hygiene; + sway; no LOB noted  End of Session PT - End of Session Equipment Utilized During Treatment: Gait belt;Cervical collar Activity Tolerance: Patient limited by pain;Patient limited by fatigue Patient left: in chair;with call bell/phone within reach;with nursing/sitter in room Nurse Communication: Mobility status;Precautions  GP     Gustavus Bryant, [place] 04/17/2013, 5:27 PM

## 2013-04-17 NOTE — Progress Notes (Signed)
Utilization review completed.  

## 2013-04-18 MED ORDER — OXYCODONE HCL 5 MG PO TABS
10.0000 mg | ORAL_TABLET | ORAL | Status: DC | PRN
  Administered 2013-04-18: 10 mg via ORAL
  Administered 2013-04-19: 20 mg via ORAL
  Administered 2013-04-19: 15 mg via ORAL
  Administered 2013-04-19 (×2): 20 mg via ORAL
  Administered 2013-04-19: 15 mg via ORAL
  Administered 2013-04-20 – 2013-04-21 (×6): 20 mg via ORAL
  Administered 2013-04-21 – 2013-04-23 (×13): 10 mg via ORAL
  Administered 2013-04-24 (×2): 20 mg via ORAL
  Administered 2013-04-24: 10 mg via ORAL
  Filled 2013-04-18: qty 3
  Filled 2013-04-18 (×2): qty 2
  Filled 2013-04-18 (×2): qty 4
  Filled 2013-04-18 (×2): qty 2
  Filled 2013-04-18 (×2): qty 4
  Filled 2013-04-18: qty 3
  Filled 2013-04-18 (×2): qty 2
  Filled 2013-04-18: qty 4
  Filled 2013-04-18: qty 2
  Filled 2013-04-18: qty 4
  Filled 2013-04-18 (×3): qty 2
  Filled 2013-04-18: qty 4
  Filled 2013-04-18: qty 2
  Filled 2013-04-18: qty 4
  Filled 2013-04-18 (×2): qty 2
  Filled 2013-04-18 (×2): qty 4
  Filled 2013-04-18 (×2): qty 2
  Filled 2013-04-18: qty 4
  Filled 2013-04-18 (×2): qty 2

## 2013-04-18 MED ORDER — HYDROMORPHONE HCL PF 1 MG/ML IJ SOLN
0.5000 mg | INTRAMUSCULAR | Status: DC | PRN
  Administered 2013-04-22: 0.5 mg via INTRAVENOUS
  Administered 2013-04-23 – 2013-04-24 (×2): 1 mg via INTRAVENOUS
  Filled 2013-04-18 (×3): qty 1

## 2013-04-18 NOTE — Progress Notes (Signed)
    Subjective: Procedure(s) (LRB): REMOVAL CERVICAL PLATES AND INTERBODY CAGE/POSTERIOR CERVICAL SPINAL FUSION C4 - C6/C5 CORPECTOMY/C4 - C6 FUSION WITH ILIAC CREST BONE GRAFT (N/A) 2 Days Post-Op  Patient reports pain as 4 on 0-10 scale.  Reports decreased arm pain reports incisional neck pain   Positive void Negative bowel movement Positive flatus Negative chest pain or shortness of breath  Objective: Vital signs in last 24 hours: Temp:  [97.6 F (36.4 C)-98.4 F (36.9 C)] 97.9 F (36.6 C) (02/06 0423) Pulse Rate:  [72-92] 72 (02/06 0738) Resp:  [8-21] 8 (02/06 0738) BP: (105-117)/(62-70) 106/68 mmHg (02/06 0738) SpO2:  [96 %-99 %] 97 % (02/06 0738)  Intake/Output from previous day: 02/05 0701 - 02/06 0700 In: 723 [P.O.:720; I.V.:3] Out: 725 [Urine:700; Drains:25]  Labs:  Recent Labs  04/17/13 1220  WBC 15.2*  RBC 4.05  HCT 35.6*  PLT 286    Recent Labs  04/17/13 1220  NA 140  K 3.9  CL 100  CO2 22  BUN 9  CREATININE 0.66  GLUCOSE 201*  CALCIUM 9.2   No results found for this basename: LABPT, INR,  in the last 72 hours  Physical Exam: ABD soft Intact pulses distally Incision: dressing C/D/I and no drainage No cellulitis present Compartment soft right UE/LE neuro exam continues to improve  Assessment/Plan: Patient stable  xrays satisfactory Mobilization with physical therapy Encourage incentive spirometry Continue care  Advance diet Up with therapy Transfer to 5N today Rehab consult for inpatient therapy  Melina Schools, MD [place] [phone]

## 2013-04-18 NOTE — Progress Notes (Signed)
Orthopedic Tech Progress Note Patient Details:  [name] May 15, 1960 [id number] Spoke to nurse who was entering order for foot orthosis to prevent foot drop; Prafo boot applied. Will try this for the time being, PT may decide to go with something else if need be.  Ortho Devices Type of Ortho Device: Other (comment) Ortho Device/Splint Location: Right PRAFO Ortho Device/Splint Interventions: Application   Asia R Thompson 04/18/2013, 12:32 PM

## 2013-04-18 NOTE — Progress Notes (Signed)
Gave report to [employer] on 5N. Pt transferring with all belongings.

## 2013-04-18 NOTE — Progress Notes (Signed)
Thank you for consult on [name]. PT evaluation done yesterday and patient at min assist. She should do well with PT follow up on acute and no PT follow up needed per recommendations. Will defer CIR consult.

## 2013-04-18 NOTE — Progress Notes (Signed)
Physical Therapy Treatment Patient Details Name: [name] MRN: [id number] DOB: August 31, 1960 Today's Date: 04/18/2013 Time: 1025-8527 PT Time Calculation (min): 15 min  PT Assessment / Plan / Recommendation  History of Present Illness Pt admitted for removal of cervical hardware and collapsed cage in neck.  Pt underwent a C4-C6 fusion with C5 corpectomy.   Pt with post op R side weakness in UE and LE.  Pt did have preop foot drop in R foot but otherwise strength was [name].  Pt has had R rotator cuff surgery, R hip replacement, R carpel tunnel surgery and was waiting to have L rotator cuff surgery.    PT Comments   Pt ambulated with new Rt AFO today. Demo increased gt abnormalities and was unsteady with new AFO. Pt reports insole needs to be adjusted and reports representative ensured her someone would be around to fit appropriately this weekend. Pt requires cues to maintain cervical neck precautions, primarily with bed mobility. Will cont to follow per POC. Recommend OPPT as tolerated and as seen appropriate for gt abnormalities and due to frequent falls.   Follow Up Recommendations  Outpatient PT;Supervision/Assistance - 24 hour;Other (comment) (for gt abnormalities )     Does the patient have the potential to tolerate intense rehabilitation     Barriers to Discharge        Equipment Recommendations  None recommended by PT    Recommendations for Other Services    Frequency Min 5X/week   Progress towards PT Goals Progress towards PT goals: Progressing toward goals  Plan Discharge plan needs to be updated    Precautions / Restrictions Precautions Precautions: Cervical;Fall Precaution Comments: pt reports multiple recent falls at home; pt given cerviical precautions handout and reviewed thorughly  Required Braces or Orthoses: Other Brace/Splint;Cervical Brace Cervical Brace: Hard collar;At all times Other Brace/Splint: new Rt AFO now in room  Restrictions Weight Bearing  Restrictions: No   Pertinent Vitals/Pain 8/10 primarily in shoulders. patient repositioned for comfort and given ice pack   Mobility  Bed Mobility Overal bed mobility: Needs Assistance Bed Mobility: Rolling;Sidelying to Sit;Sit to Sidelying Rolling: Supervision Sidelying to sit: Supervision Sit to sidelying: Supervision General bed mobility comments: pt attempting to come supine to sit without log rolling; encouraged to perform log rolling technique to decrease stress on cervical spine; requires incr time and is efforful for pt Transfers Overall transfer level: Needs assistance Equipment used: Rolling walker (2 wheeled) Transfers: Sit to/from Stand Sit to Stand: Supervision General transfer comment: supervision for safety; cues for hand placement and to maintain cervical precautions  Ambulation/Gait Ambulation/Gait assistance: Min guard Ambulation Distance (Feet): 110 Feet Assistive device: Rolling walker (2 wheeled) Gait Pattern/deviations: Decreased weight shift to right;Decreased dorsiflexion - right;Wide base of support;Steppage Gait velocity: decreased Gait velocity interpretation: Below normal speed for age/gender General Gait Details: pt wearing new Rt AFO today; demo increased gt abnormalities today with new brace; pt unsteady with new AFO; is going to request adjustment tomorrow when orthotic representative returns; min guard for safety and to steady         PT Diagnosis:    PT Problem List:   PT Treatment Interventions:     PT Goals (current goals can now be found in the care plan section) Acute Rehab PT Goals Patient Stated Goal: to be independent  PT Goal Formulation: With patient Time For Goal Achievement: 05/01/13 Potential to Achieve Goals: Good  Visit Information  Last PT Received On: 04/18/13 Assistance Needed: +1 History of Present  Illness: Pt admitted for removal of cervical hardware and collapsed cage in neck.  Pt underwent a C4-C6 fusion with C5  corpectomy.   Pt with post op R side weakness in UE and LE.  Pt did have preop foot drop in R foot but otherwise strength was [name].  Pt has had R rotator cuff surgery, R hip replacement, R carpel tunnel surgery and was waiting to have L rotator cuff surgery.     Subjective Data  Subjective: Pt lying supine; "i think i got the right brace now"  Patient Stated Goal: to be independent    Cognition  Cognition Arousal/Alertness: Awake/alert Behavior During Therapy: WFL for tasks assessed/performed Overall Cognitive Status: Within Functional Limits for tasks assessed    Balance  Balance Overall balance assessment: History of Falls;Needs assistance Sitting-balance support: Feet supported;No upper extremity supported Sitting balance-Leahy Scale: Good Standing balance support: During functional activity;No upper extremity supported;Bilateral upper extremity supported Standing balance-Leahy Scale: Good Standing balance comment: no sway noted today General Comments General comments (skin integrity, edema, etc.): pt continues to c/o pain in shoulders   End of Session PT - End of Session Equipment Utilized During Treatment: Gait belt;Cervical collar Activity Tolerance: Patient limited by pain;Patient limited by fatigue Patient left: in bed;with call bell/phone within reach Nurse Communication: Mobility status;Precautions   GP     Gustavus Bryant, [place] 04/18/2013, 4:42 PM

## 2013-04-19 NOTE — Progress Notes (Signed)
OT Cancellation Note  Patient Details Name: [name] MRN: [id number] DOB: [birth year]   Cancelled Treatment:    Reason Eval/Treat Not Completed: Other (comment) checked on pt at 1330. She states she just got back to bed and had a choking episode.  NT in room with her.  Did not have a chance to check back.  Will check back tomorrow as schedule permits.  [name] 04/19/2013, 4:37 PM Lesle Chris, OTR/L [phone] 04/19/2013

## 2013-04-19 NOTE — Progress Notes (Signed)
Physical Therapy Treatment Patient Details Name: [name] MRN: [id number] DOB: [birth year] Today's Date: 04/19/2013 Time: 4431-5400 PT Time Calculation (min): 28 min  PT Assessment / Plan / Recommendation  History of Present Illness Pt admitted for removal of cervical hardware and collapsed cage in neck.  Pt underwent a C4-C6 fusion with C5 corpectomy.   Pt with post op R side weakness in UE and LE.  Pt did have preop foot drop in R foot but otherwise strength was [facility].  Pt has had R rotator cuff surgery, R hip replacement, R carpel tunnel surgery and was waiting to have L rotator cuff surgery.    PT Comments   Pt making steady progress toward goals. Improved gait with new brace today. Stair education completed. Pt inquiring about a brace for her right hand drop as well (defered to OT). Pt will benefit from outpatient neuro rehab to address balance deficits (history of multiple falls) and work further on gait with new brace for foot drop as recommended by PT yesterday.   Follow Up Recommendations  Outpatient PT;Supervision/Assistance - 24 hour;Other (comment) (needs balance rehab at a neuro outpatient clinic)     Equipment Recommendations  None recommended by PT    Frequency Min 5X/week   Progress towards PT Goals Progress towards PT goals: Progressing toward goals  Plan Current plan remains appropriate    Precautions / Restrictions Precautions Precautions: Cervical;Fall Precaution Comments: pt reports multiple recent falls at home; reviewed cervical precautions. Required Braces or Orthoses: Other Brace/Splint;Cervical Brace Cervical Brace: Hard collar;Soft collar Other Brace/Splint: Rt  blue rocker AFO Restrictions Weight Bearing Restrictions: No       Mobility  Bed Mobility Overal bed mobility: Needs Assistance Bed Mobility: Rolling;Sidelying to Sit Rolling: Supervision Sidelying to sit: Supervision General bed mobility comments: cues on logroll technique with bed flat  and no rail used. Transfers Overall transfer level: Needs assistance Equipment used: None Transfers: Sit to/from Stand Sit to Stand: Supervision Stand pivot transfers: Supervision General transfer comment: supervision for safety; cues for hand placement and to maintain cervical precautions  Ambulation/Gait Ambulation/Gait assistance: Min guard Ambulation Distance (Feet): 250 Feet Assistive device: Rolling walker (2 wheeled) Gait Pattern/deviations: Step-through pattern;Decreased stride length;Decreased step length - left Gait velocity interpretation: Below normal speed for age/gender General Gait Details: steady today with brace and walker. improved right foot clearance and no knee genu recurvatum noted with stance. Incr time spent education pt on how to correctly don new brace and proper shoes to use with brace. Pt reported brace feeling better overall today with still come crowding around toes in current shoes.  Stairs: Yes Stairs assistance: Min guard Stair Management: Step to pattern;Sideways;One rail Right Number of Stairs: 5 General stair comments: cues on technique for using one rail and going sideways.      PT Goals (current goals can now be found in the care plan section) Acute Rehab PT Goals Patient Stated Goal: to be independent  PT Goal Formulation: With patient Time For Goal Achievement: 05/01/13 Potential to Achieve Goals: Good  Visit Information  Last PT Received On: 04/19/13 Assistance Needed: +1 History of Present Illness: Pt admitted for removal of cervical hardware and collapsed cage in neck.  Pt underwent a C4-C6 fusion with C5 corpectomy.   Pt with post op R side weakness in UE and LE.  Pt did have preop foot drop in R foot but otherwise strength was [facility].  Pt has had R rotator cuff surgery, R hip replacement, R carpel  tunnel surgery and was waiting to have L rotator cuff surgery.     Subjective Data  Patient Stated Goal: to be independent    Cognition   Cognition Arousal/Alertness: Awake/alert Behavior During Therapy: WFL for tasks assessed/performed Overall Cognitive Status: Within Functional Limits for tasks assessed       End of Session PT - End of Session Equipment Utilized During Treatment: Gait belt;Cervical collar;Other (comment) (right blue rocker brace) Activity Tolerance: Patient tolerated treatment well Patient left: in chair;with call bell/phone within reach Nurse Communication: Mobility status;Precautions   GP     Willow Ora 04/19/2013, 1:10 PM

## 2013-04-19 NOTE — Progress Notes (Signed)
[name]  MRN: [id number] DOB/Age: 1960-08-20 53 y.o. Physician: Ander Slade, M.D. 3 Days Post-Op Procedure(s) (LRB): REMOVAL CERVICAL PLATES AND INTERBODY CAGE/POSTERIOR CERVICAL SPINAL FUSION C4 - C6/C5 CORPECTOMY/C4 - C6 FUSION WITH ILIAC CREST BONE GRAFT (N/A)  Subjective: Reports continued interscapular pain, pre-op pain much improved. Feels as though strength is improving Vital Signs Temp:  [98 F (36.7 C)-98.5 F (36.9 C)] 98.2 F (36.8 C) (02/07 0700) Pulse Rate:  [71-83] 79 (02/07 0700) Resp:  [13-18] 16 (02/07 0700) BP: (103-130)/(62-79) 118/79 mmHg (02/07 0700) SpO2:  [97 %-100 %] 100 % (02/07 0700)  Lab Results  Recent Labs  04/17/13 1220  WBC 15.2*  HGB 12.2  HCT 35.6*  PLT 286   BMET  Recent Labs  04/17/13 1220  NA 140  K 3.9  CL 100  CO2 22  GLUCOSE 201*  BUN 9  CREATININE 0.66  CALCIUM 9.2   INR  Date Value Range Status  05/06/2012 0.92  0.00 - 1.49 Final     Exam  4/5 grip strength R, dressings dry. c-collar in place, easily mobilizes to bathroom.  Plan Continued post op care as per Dr. Adair Patter M 04/19/2013, 9:32 AM

## 2013-04-20 MED ORDER — POLYETHYLENE GLYCOL 3350 17 G PO PACK
17.0000 g | PACK | Freq: Every day | ORAL | Status: DC
  Administered 2013-04-21 – 2013-04-24 (×4): 17 g via ORAL
  Filled 2013-04-20 (×5): qty 1

## 2013-04-20 MED ORDER — BISACODYL 5 MG PO TBEC
5.0000 mg | DELAYED_RELEASE_TABLET | Freq: Every day | ORAL | Status: DC | PRN
  Administered 2013-04-20: 5 mg via ORAL
  Filled 2013-04-20 (×2): qty 1

## 2013-04-20 MED ORDER — FLEET ENEMA 7-19 GM/118ML RE ENEM
1.0000 | ENEMA | Freq: Every day | RECTAL | Status: DC | PRN
Start: 2013-04-20 — End: 2013-04-24

## 2013-04-20 MED ORDER — ACETAMINOPHEN 325 MG PO TABS
650.0000 mg | ORAL_TABLET | Freq: Four times a day (QID) | ORAL | Status: DC | PRN
  Administered 2013-04-20 – 2013-04-21 (×3): 650 mg via ORAL
  Filled 2013-04-20 (×3): qty 2

## 2013-04-20 NOTE — Progress Notes (Signed)
Taking meds whole w applesauce or pudding.States will stay on Reg soft diet for now.No difficulty swallowing noted,c/o still has sore throat.Prns given w relief.R hand droop present.R foot droop still present.has prafo boot on.Aggie Moats D

## 2013-04-20 NOTE — Progress Notes (Signed)
Subjective: 4 Days Post-Op Procedure(s) (LRB): REMOVAL CERVICAL PLATES AND INTERBODY CAGE/POSTERIOR CERVICAL SPINAL FUSION C4 - C6/C5 CORPECTOMY/C4 - C6 FUSION WITH ILIAC CREST BONE GRAFT (N/A) Patient reports pain as moderate.   Patient seen in rounds with Dr. Gladstone Lighter. Patient is having some issues with decreased stength and sensaton in right UE. She reports that it is improved compared to immediately after surgery though. No issues overnight. No SOB or chest pain. She expressed desire for discharge home tomorrow vs SNF.    Objective: Vital signs in last 24 hours: Temp:  [97.9 F (36.6 C)-98.7 F (37.1 C)] 98.7 F (37.1 C) (02/08 0551) Pulse Rate:  [69-76] 76 (02/08 0551) Resp:  [12-18] 18 (02/08 0551) BP: (101-117)/(56-73) 104/62 mmHg (02/08 0551) SpO2:  [95 %-100 %] 98 % (02/08 0551)  Intake/Output from previous day:  Intake/Output Summary (Last 24 hours) at 04/20/13 0931 Last data filed at 04/20/13 0801  Gross per 24 hour  Intake   1443 ml  Output      0 ml  Net   1443 ml    Intake/Output this shift: Total I/O In: 360 [P.O.:360] Out: -   Labs:  Recent Labs  04/17/13 1220  HGB 12.2    Recent Labs  04/17/13 1220  WBC 15.2*  RBC 4.05  HCT 35.6*  PLT 286    Recent Labs  04/17/13 1220  NA 140  K 3.9  CL 100  CO2 22  BUN 9  CREATININE 0.66  GLUCOSE 201*  CALCIUM 9.2    EXAM General - Patient is Alert and Oriented Extremity - Grip strength 5/5 left UE. 3/5 right UE Decreased sensation right UE Radial pulse 2+ bilaterally  Past Medical History  Diagnosis Date  . Headache(784.0)     AND NECK PAIN--STATES RECENT TEST SHOW CERVICAL DEGENERATION  . PONV (postoperative nausea and vomiting)     PT GIVES HX OF N&V AND FEVER WITH SURGERIES YEARS AGO--BUT NO PROBLEMS WITH MORE RECENT SURGERIES--STATES NOT MALIGNANT HYPERTHERMIA  . Abnormal EKG     HX OF INVERTED T WAVES ON EKG, PALPITATIONS, CHEST PAINS-CARDIAC WORK UP DID NOT SHOW ANY HEART DISEASE    . Hypertension   . Asthma     INHALERS WITH FLARE -UPS  . GERD (gastroesophageal reflux disease)   . DDD (degenerative disc disease)     CERVICAL AND LUMBAR-CHRONIC PAIN, RT HIP LABRAL TEAR  . Pain     CHRONIC NECK AND BACK PAIN - LIMITED ROM NECK - S/P FUSIONS CERVICAL AND LUMBAR  . Anemia     Iron Infusion-8 yrs ago  . History of blood transfusion     s/p back surgery  . Inverted T wave   . Pneumonia   . History of kidney stones   . Right foot drop   . Anxiety   . Depression     PT STATES A LOT OF STRESS IN HER LIFE    Assessment/Plan: 4 Days Post-Op Procedure(s) (LRB): REMOVAL CERVICAL PLATES AND INTERBODY CAGE/POSTERIOR CERVICAL SPINAL FUSION C4 - C6/C5 CORPECTOMY/C4 - C6 FUSION WITH ILIAC CREST BONE GRAFT (N/A) Active Problems:   Neck pain  Estimated body mass index is 23.6 kg/(m^2) as calculated from the following:   Height as of this encounter: 5\' 4"  (1.626 m).   Weight as of this encounter: 62.4 kg (137 lb 9.1 oz). Advance diet Plan for discharge tomorrow  Doing fair today. Seems to be improving in regards to neurological exam in right UE. Home vs SNF tomorrow.  Lyrika Souders LAUREN 04/20/2013, 9:31 AM

## 2013-04-20 NOTE — Progress Notes (Signed)
Occupational Therapy Treatment Patient Details Name: [name] MRN: [id number] DOB: 30-Oct-1960 Today's Date: 04/20/2013 Time: 1040-1110 OT Time Calculation (min): 30 min  OT Assessment / Plan / Recommendation  History of present illness Pt admitted for removal of cervical hardware and collapsed cage in neck.  Pt underwent a C4-C6 fusion with C5 corpectomy.   Pt with post op R side weakness in UE and LE.  Pt did have preop foot drop in R foot but otherwise strength was [facility].  Pt has had R rotator cuff surgery, R hip replacement, R carpel tunnel surgery and was waiting to have L rotator cuff surgery.    OT comments  Moving with less assistance than previously noted.  Integrating rue/hand use but states still noted weakness with con't. Spillage from utensils during meals.    Follow Up Recommendations  CIR;Supervision/Assistance - 24 hour;Outpatient OT                  Recommendations for Other Services Rehab consult  Frequency Min 3X/week   Progress towards OT Goals Progress towards OT goals: Progressing toward goals  Plan Discharge plan remains appropriate    Precautions / Restrictions Precautions Precautions: Cervical;Fall Precaution Comments: pt reports multiple recent falls at home; reviewed cervical precautions. Required Braces or Orthoses: Other Brace/Splint;Cervical Brace Cervical Brace: Hard collar;Soft collar Other Brace/Splint: Rt  blue rocker AFO   Pertinent Vitals/Pain Describes stiffness and aches in tops of shoulders    ADL  Grooming: Performed;Wash/dry face;Set up Where Assessed - Grooming: Supported sitting Toilet Transfer: Water engineer Method: Stand pivot Toileting - Water quality scientist and Hygiene: Simulated;Min guard Where Assessed - Toileting Clothing Manipulation and Hygiene: Standing Transfers/Ambulation Related to ADLs: sit/stand and stand pivot with approx. 2-3 steps with min guard a ADL Comments: states r hand is still  weak but is determined to use it.  used l/r hands to hold and shift wash rag around while washing face.  reports eating/holding utensils with right hand with min. spillage noted.     OT Goals(current goals can now be found in the care plan section)    Visit Information  Last OT Received On: 04/20/13 History of Present Illness: Pt admitted for removal of cervical hardware and collapsed cage in neck.  Pt underwent a C4-C6 fusion with C5 corpectomy.   Pt with post op R side weakness in UE and LE.  Pt did have preop foot drop in R foot but otherwise strength was [facility].  Pt has had R rotator cuff surgery, R hip replacement, R carpel tunnel surgery and was waiting to have L rotator cuff surgery.     Subjective Data   "i know i have to allow myself to get better because i have to be available to help my son with all of my grand children"          Cognition  Cognition Arousal/Alertness: Awake/alert Behavior During Therapy: WFL for tasks assessed/performed Overall Cognitive Status: Within Functional Limits for tasks assessed    Mobility  Bed Mobility Overal bed mobility: Needs Assistance Bed Mobility: Rolling;Sidelying to Sit Rolling: Supervision Sidelying to sit: Supervision General bed mobility comments: able to initiate logroll tech. without cues required Transfers Overall transfer level: Needs assistance Equipment used: Rolling walker (2 wheeled) Transfers: Sit to/from Stand Sit to Stand: Min guard;Supervision Stand pivot transfers: Min guard;Supervision              End of Session OT - End of Session Equipment Utilized During Treatment: Cervical  collar Activity Tolerance: Patient tolerated treatment well Patient left: in chair;with call bell/phone within reach       Janice Coffin, COTA/L 04/20/2013, 11:59 AM

## 2013-04-21 ENCOUNTER — Encounter (HOSPITAL_COMMUNITY): Payer: Self-pay | Admitting: Orthopedic Surgery

## 2013-04-21 MED ORDER — OXYCODONE-ACETAMINOPHEN 7.5-325 MG PO TABS: 1.0000 | ORAL_TABLET | Freq: Four times a day (QID) | ORAL | Status: DC | PRN

## 2013-04-21 NOTE — Care Management Note (Signed)
04/21/13 [place], RN BSN Patient requires SNF care, CM spoke with Benjiman Core PA. Patient will go to [facility] on 04/22/13.

## 2013-04-21 NOTE — Progress Notes (Signed)
    Subjective: Procedure(s) (LRB): REMOVAL CERVICAL PLATES AND INTERBODY CAGE/POSTERIOR CERVICAL SPINAL FUSION C4 - C6/C5 CORPECTOMY/C4 - C6 FUSION WITH ILIAC CREST BONE GRAFT (N/A) 5 Days Post-Op  Patient reports pain as 4 on 0-10 scale.  Reports decreased arm pain reports incisional neck pain   Positive void Positive bowel movement Positive flatus Negative chest pain or shortness of breath  Objective: Vital signs in last 24 hours: Temp:  [98.2 F (36.8 C)-99.6 F (37.6 C)] 98.2 F (36.8 C) (02/09 0532) Pulse Rate:  [66-75] 66 (02/09 0532) Resp:  [16-18] 18 (02/08 2042) BP: (90-117)/(51-73) 90/51 mmHg (02/08 2042) SpO2:  [96 %-100 %] 96 % (02/08 2042)  Intake/Output from previous day: 02/08 0701 - 02/09 0700 In: 1203 [P.O.:1200; I.V.:3] Out: -   Labs: No results found for this basename: WBC, RBC, HCT, PLT,  in the last 72 hours No results found for this basename: NA, K, CL, CO2, BUN, CREATININE, GLUCOSE, CALCIUM,  in the last 72 hours No results found for this basename: LABPT, INR,  in the last 72 hours  Physical Exam: Intact pulses distally Incision: dressing C/D/I Compartment soft improved neuro exam.  weakness persists but is not 4-/5  Assessment/Plan: Patient stable  xrays satisfactory Mobilization with physical therapy Encourage incentive spirometry Continue care  Advance diet Up with therapy Plan on CIR vs SNF  Will have SW eval today for SNF d/c if patient not accepted for inpatient rehab.  Melina Schools, MD [place] [phone]

## 2013-04-21 NOTE — Care Management Note (Signed)
:    04/21/13 12:21pm Ricki Miller, RN BSN Case Manager Informed patient that she is not appropriate for CIR, patient states she will go home and doesnt want home health," she doesnt like people like that". Case Manager contacted [facility] to cancel home health consult.

## 2013-04-21 NOTE — Progress Notes (Signed)
Physical Therapy Treatment Patient Details Name: [name] MRN: [id number] DOB: 1961/01/26 Today's Date: 04/21/2013 Time: 5462-7035 PT Time Calculation (min): 14 min  PT Assessment / Plan / Recommendation  History of Present Illness Pt admitted for removal of cervical hardware and collapsed cage in neck.  Pt underwent a C4-C6 fusion with C5 corpectomy.   Pt with post op R side weakness in UE and LE.  Pt did have preop foot drop in R foot but otherwise strength was [facility].  Pt has had R rotator cuff surgery, R hip replacement, R carpel tunnel surgery and was waiting to have L rotator cuff surgery.    PT Comments   Noted dc plan has changed to SNF, which is a viable plan for pt -- we discussed postacute rehab at length, and I emphasized to pt that the purpose of rehab is to maximize function so that she will be safer and stronger once home   Follow Up Recommendations  SNF     Does the patient have the potential to tolerate intense rehabilitation     Barriers to Discharge        Equipment Recommendations  None recommended by PT    Recommendations for Other Services    Frequency Min 5X/week   Progress towards PT Goals Progress towards PT goals: Progressing toward goals  Plan Discharge plan needs to be updated    Precautions / Restrictions Precautions Precautions: Cervical;Fall Precaution Comments: pt reports multiple recent falls at home; reviewed cervical precautions. Required Braces or Orthoses: Other Brace/Splint;Cervical Brace Cervical Brace: Hard collar;Soft collar Other Brace/Splint: Rt  blue rocker AFO   Pertinent Vitals/Pain 6-7/10 neck/shoulder pain patient repositioned for comfort and notified RN of request for pain meds    Mobility  Bed Mobility Overal bed mobility: Needs Assistance Bed Mobility: Supine to Sit Rolling: Supervision Sidelying to sit: Supervision General bed mobility comments: Managing bed mobility well, including logroll Transfers Overall  transfer level: Needs assistance Equipment used: Rolling walker (2 wheeled) Transfers: Sit to/from Stand Sit to Stand: Min guard;Supervision General transfer comment: supervision for safety; cues for hand placement and to maintain cervical precautions  Ambulation/Gait Ambulation/Gait assistance: Min guard Ambulation Distance (Feet): 200 Feet Assistive device: Rolling walker (2 wheeled) Gait Pattern/deviations: Step-through pattern General Gait Details: steady today with brace and walker. improved right foot clearance and no knee genu recurvatum noted with stance. Incr time spent education pt on how to correctly don new brace and proper shoes to use with brace. Pt reported brace feeling better overall today with still come crowding around toes in current shoes.     Exercises     PT Diagnosis:    PT Problem List:   PT Treatment Interventions:     PT Goals (current goals can now be found in the care plan section) Acute Rehab PT Goals Patient Stated Goal: to be independent  PT Goal Formulation: With patient Time For Goal Achievement: 05/01/13 Potential to Achieve Goals: Good  Visit Information  Last PT Received On: 04/21/13 Assistance Needed: +1 History of Present Illness: Pt admitted for removal of cervical hardware and collapsed cage in neck.  Pt underwent a C4-C6 fusion with C5 corpectomy.   Pt with post op R side weakness in UE and LE.  Pt did have preop foot drop in R foot but otherwise strength was [facility].  Pt has had R rotator cuff surgery, R hip replacement, R carpel tunnel surgery and was waiting to have L rotator cuff surgery.  Subjective Data  Subjective: Agreeable to amb; Frustrated with the need for postacute rehab, but willing to go Patient Stated Goal: to be independent    Cognition  Cognition Arousal/Alertness: Awake/alert Behavior During Therapy: WFL for tasks assessed/performed Overall Cognitive Status: Within Functional Limits for tasks assessed    Balance      End of Session PT - End of Session Equipment Utilized During Treatment: Cervical collar;Other (comment) (r afo) Activity Tolerance: Patient tolerated treatment well Patient left: in chair;with call bell/phone within reach Nurse Communication: Mobility status;Patient requests pain meds   GP     Roney Marion [facility] [place], [place]  04/21/2013, 4:33 PM

## 2013-04-21 NOTE — Care Management Note (Signed)
CARE MANAGEMENT NOTE 04/21/2013  Patient:  [name], [name]   Account Number:  [id number]  Date Initiated:  04/21/2013  Documentation initiated by:  [facility]  Subjective/Objective Assessment:   admitted s/p C4-6 fusion     Action/Plan:   PT/OT evals-recommended outpatient PT/OT   Anticipated DC Date:  04/21/2013   Anticipated DC Plan:  [place]  CM consult      Choice offered to / List presented to:             Status of service:  Completed, signed off

## 2013-04-21 NOTE — Care Management Note (Signed)
CARE MANAGEMENT NOTE 04/21/2013  Patient:  [name], [name]   Account Number:  [id number]  Date Initiated:  04/21/2013  Documentation initiated by:  [facility]  Subjective/Objective Assessment:   admitted s/p C4-6 fusion     Action/Plan:   PT/OT evals-recommended outpatient PT/OT   Anticipated DC Date:  04/21/2013   Anticipated DC Plan:  [place]  CM consult      [facility] Choice  HOME HEALTH   Choice offered to / List presented to:  [name]        [place] arranged  [place].   Status of service:  Completed, signed off Medicare Important Message given?   (If response is "NO", the following Medicare IM given date fields will be blank) Date Medicare IM given:   Date Additional Medicare IM given:    Discharge Disposition:  [place]  Per UR Regulation:    If discussed at Long Length of Stay Meetings, dates discussed:    Comments:  04/21/13 11:00am Shelby Miller, RN BSN Case Manager Case Manager spoke with patient concerning discharge plans. she is  not a candidate for CIR, and states she cannot go to a SNF. Patient has little to no assistance at home. Choice for [facility] offered. Referral called to [facility].

## 2013-04-22 ENCOUNTER — Inpatient Hospital Stay (HOSPITAL_COMMUNITY): Payer: Medicare Other

## 2013-04-22 DIAGNOSIS — R0609 Other forms of dyspnea: Secondary | ICD-10-CM

## 2013-04-22 DIAGNOSIS — R42 Dizziness and giddiness: Secondary | ICD-10-CM

## 2013-04-22 DIAGNOSIS — E871 Hypo-osmolality and hyponatremia: Secondary | ICD-10-CM

## 2013-04-22 DIAGNOSIS — R0989 Other specified symptoms and signs involving the circulatory and respiratory systems: Secondary | ICD-10-CM

## 2013-04-22 DIAGNOSIS — I951 Orthostatic hypotension: Secondary | ICD-10-CM

## 2013-04-22 HISTORY — DX: Orthostatic hypotension: I95.1

## 2013-04-22 HISTORY — DX: Dizziness and giddiness: R42

## 2013-04-22 LAB — BASIC METABOLIC PANEL
BUN: 8 mg/dL (ref 6–23)
CHLORIDE: 98 meq/L (ref 96–112)
CO2: 24 mEq/L (ref 19–32)
CREATININE: 0.64 mg/dL (ref 0.50–1.10)
Calcium: 9.5 mg/dL (ref 8.4–10.5)
GFR calc Af Amer: >90 mL/min (ref >90)
GFR calc non Af Amer: >90 mL/min (ref >90)
Glucose, Bld: 134 mg/dL — ABNORMAL HIGH (ref 70–99)
Potassium: 5 mEq/L (ref 3.7–5.3)
Sodium: 135 mEq/L — ABNORMAL LOW (ref 137–147)

## 2013-04-22 LAB — CBC
HEMATOCRIT: 36.6 % (ref 36.0–46.0)
Hemoglobin: 12.8 g/dL (ref 12.0–15.0)
MCH: 30.4 pg (ref 26.0–34.0)
MCHC: 35 g/dL (ref 30.0–36.0)
MCV: 86.9 fL (ref 78.0–100.0)
Platelets: 354 10*3/uL (ref 150–400)
RBC: 4.21 MIL/uL (ref 3.87–5.11)
RDW: 13 % (ref 11.5–15.5)
WBC: 7.5 10*3/uL (ref 4.0–10.5)

## 2013-04-22 MED ORDER — ONDANSETRON 4 MG PO TBDP
4.0000 mg | ORAL_TABLET | Freq: Four times a day (QID) | ORAL | Status: DC | PRN
  Administered 2013-04-22: 4 mg via ORAL
  Filled 2013-04-22 (×3): qty 1

## 2013-04-22 MED ORDER — FLEET ENEMA 7-19 GM/118ML RE ENEM
1.0000 | ENEMA | Freq: Once | RECTAL | Status: AC
  Administered 2013-04-22: 1 via RECTAL
  Filled 2013-04-22: qty 1

## 2013-04-22 MED ORDER — SODIUM CHLORIDE 0.9 % IV SOLN
INTRAVENOUS | Status: AC
  Administered 2013-04-22: 15:00:00 via INTRAVENOUS

## 2013-04-22 MED ORDER — MAGNESIUM CITRATE PO SOLN: 0.5000 | Freq: Once | ORAL | Status: DC

## 2013-04-22 MED ORDER — ONDANSETRON HCL 4 MG/2ML IJ SOLN: 4.0000 mg | Freq: Four times a day (QID) | INTRAMUSCULAR | Status: DC | PRN

## 2013-04-22 NOTE — Progress Notes (Signed)
Subjective: Seen,this morning. Complaining of tightness posterior neck.  No c/o dysphagia.  No BM x 1 week.  Pos flatus.  Some nausea.  Felt somewhat dizzy when standing.  No complaints of chest pain, sob.    Objective: Vital signs in last 24 hours: Temp:  [98.1 F (36.7 C)-99 F (37.2 C)] 99 F (37.2 C) (02/10 1040) Pulse Rate:  [73-93] 93 (02/10 1040) Resp:  [18] 18 (02/10 1040) BP: (89-125)/(54-93) 89/56 mmHg (02/10 1040) SpO2:  [96 %-100 %] 97 % (02/10 1040)  Intake/Output from previous day: 02/09 0701 - 02/10 0700 In: 1200 [P.O.:1200] Out: -  Intake/Output this shift: Total I/O In: 240 [P.O.:240] Out: -   No results found for this basename: HGB,  in the last 72 hours No results found for this basename: WBC, RBC, HCT, PLT,  in the last 72 hours No results found for this basename: NA, K, CL, CO2, BUN, CREATININE, GLUCOSE, CALCIUM,  in the last 72 hours No results found for this basename: LABPT, INR,  in the last 72 hours  Exam:  Wounds anterior/posterior look good.  No drainage or signs of infection.  Neurologically intact.    Xray:  KUB showed mild ileus.   Assessment/Plan: Orthostatic hypotension.- hold BP meds for now.  Labs cbc,bmet  Ileus-  Fleet enema, and mag citrate now.      [name] M 04/22/2013, 11:11 AM

## 2013-04-22 NOTE — Progress Notes (Signed)
Patient complaining of pain to IV site to right ac.  IV removed, redness noted at the site.  Heat applied, and extremity elevated on pillow, with improvement noted.  Arlee Muslim, PA on call for Dr. Rolena Infante aware, instructed to continue with warm compresses and elevation and to have Dr. Rolena Infante look at site in the am.  Nursing will continue to monitor.

## 2013-04-22 NOTE — Discharge Summary (Signed)
[facility] Medical Consultation  [name] [id number] DOB: [birth year] DOA: 04/16/2013 PCP: Margarita Rana, MD   Requesting physician: Dr. Rolena Infante Date of consultation: 2.10.2015 Reason for consultation: orthostatic hypotension  Impression/Recommendations Orthostatic hypotension/Dizziness - She relates dizziness upon standing, orthostatic vitals were checked and her systolic blood pressure dropped from 125->89, her heart rate it went up more than 10. Her basic metabolic panel showed a sodium: From 140->135 the potassium going from 3.5- >5.0, her chloride less than 100 and an increase in her bicarbonate. She's currently not a diuretic. She was recently in surgery and with ongoing use of her medications. She relates no diarrhea there has been no documented fevers but she does relate some poor by mouth intake. On physical exam I can not hear aortic valve closed, and she describes shortness of breath with ambulation for the past several months. She also relates the dizziness this started several months before coming to the hospital. She relates his dizziness as lightheadedness she has never truly passed out or fallen to the floor. I think is a combination of poor oral intake with ongoing medication use. Will hold her Norvasc, continue her metoprolol. We'll give her a liter of normal saline and check orthostatics tomorrow morning and a basic metabolic panel. I will also get a 2-D echo to rule out any aortic stenosis.   I will followup again tomorrow. Please contact me if I can be of assistance in the meanwhile. Thank you for this consultation.  Chief Complaint: dizziness  HPI:  53 year old female with past medical history of hypertension on Norvasc and metoprolol she was admitted on 04/16/2013 for  Removal of anterior cervical plate and intravertebral cage on C5 and C6, developed an ileus now resolved. She relates that she started having worse dizziness upon standing started consulted  for further evaluation. She relates her dizziness is more of a lightheadedness it only happens when she sit-ups or stands up she doesn't lose consciousness but she feels that she did not pass out. She relates no auras, she does relate palpitations. She relates some shortness of breath with exertion that has been going on for a few months. She relates no chest pain nausea vomiting diarrhea she relates ongoing use of her blood pressure medication despite feeling like this.  Review of Systems:  Constitutional:  No weight loss, night sweats, Fevers, chills, fatigue.  HEENT:  No headaches, Difficulty swallowing,Tooth/dental problems,Sore throat,  No sneezing, itching, ear ache, nasal congestion, post nasal drip,  Cardio-vascular:  No chest pain, Orthopnea, PND, swelling in lower extremities, anasarca, dizziness, palpitations  GI:  No heartburn, indigestion, abdominal pain, nausea, vomiting, diarrhea, change in bowel habits, loss of appetite  Resp:  No shortness of breath with exertion or at rest. No excess mucus, no productive cough, No non-productive cough, No coughing up of blood.No change in color of mucus.No wheezing.No chest wall deformity  Skin:  no rash or lesions.  GU:  no dysuria, change in color of urine, no urgency or frequency. No flank pain.  Musculoskeletal:  No joint pain or swelling. No decreased range of motion. No back pain.  Psych:  No change in mood or affect. No depression or anxiety. No memory loss.  Past Medical History  Diagnosis Date  . Headache(784.0)     AND NECK PAIN--STATES RECENT TEST SHOW CERVICAL DEGENERATION  . PONV (postoperative nausea and vomiting)     PT GIVES HX OF N&V AND FEVER WITH SURGERIES YEARS AGO--BUT NO PROBLEMS WITH MORE RECENT SURGERIES--STATES NOT MALIGNANT  HYPERTHERMIA  . 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 Hypertension   . Asthma     INHALERS WITH  FLARE -UPS  . GERD (gastroesophageal reflux disease)   . DDD (degenerative disc disease)     CERVICAL AND LUMBAR-CHRONIC PAIN, RT HIP LABRAL TEAR  . Pain     CHRONIC NECK AND BACK PAIN - LIMITED ROM NECK - S/P FUSIONS CERVICAL AND LUMBAR  . Anemia     Iron Infusion-8 yrs ago  . History of blood transfusion     s/p back surgery  . Inverted T wave   . Pneumonia   . History of kidney stones   . Right foot drop   . Anxiety   . Depression     PT STATES A LOT OF STRESS IN HER LIFE   Past Surgical History  Procedure Laterality Date  . Right hip arthroscopy for labral tear  ABOUT 2010    2012 also  . Anterior fusion cervical spine  MAY 2012    AT [facility]  . Nasal septum surgery  [place] 2013    [place]  . Back surgery  2009    LUMBAR FUSION WITH RODS   . Tonsillectomy      AS A CHILD  . Abdominal hysterectomy    . Diagnostic laparoscopies - multiple for endometriosis    . Cholecystectomy    . Hip arthroscopy  09/20/2011    Procedure: ARTHROSCOPY HIP;  Surgeon: Gearlean Alf, MD;  Location: [facility];  Service: Orthopedics;  Laterality: Right;  Right Hip Scope with Labral Debridement  . Shoulder arthroscopy  05-06-12    bone spur  . Carpal tunnel release  05-06-12    Right  . Total hip arthroplasty Right 05/13/2012    Procedure: TOTAL HIP ARTHROPLASTY ANTERIOR APPROACH;  Surgeon: Gearlean Alf, MD;  Location: [facility];  Service: Orthopedics;  Laterality: Right;  . Anterior cervical decomp/discectomy fusion N/A 10/30/2012    Procedure: ACDF C5-6, EXPLORATION AND HARDWARE REMOVAL C6-7;  Surgeon: Melina Schools, MD;  Location: [place];  Service: Orthopedics;  Laterality: N/A;  . Posterior cervical fusion/foraminotomy N/A 04/16/2013    Procedure: REMOVAL CERVICAL PLATES AND INTERBODY CAGE/POSTERIOR CERVICAL SPINAL FUSION C4 - C6/C5 CORPECTOMY/C4 - C6 FUSION WITH ILIAC CREST BONE GRAFT;  Surgeon: Melina Schools, MD;  Location: [place];  Service: Orthopedics;  Laterality: N/A;   Social History:   reports that she has never smoked. She has never used smokeless tobacco. She reports that she does not drink alcohol or use illicit drugs.  Allergies  Allergen Reactions  . Shellfish Allergy Hives  . Decadron [Dexamethasone] Other (See Comments)    Hot flashes, insomnia, "manic"  . Papaya Derivatives Hives  . Betadine [Povidone Iodine] Hives       . Chlorhexidine Hives       . Clarithromycin Hives  . Clindamycin/Lincomycin Hives  . Codeine Hives  . Other Hives     Mango   . Sulfa Antibiotics Nausea And Vomiting        History reviewed. No pertinent family history.  Prior to Admission medications   Medication Sig Start Date End Date Taking? Authorizing Provider  albuterol (PROVENTIL HFA;VENTOLIN HFA) 108 (90 BASE) MCG/ACT inhaler Inhale 2 puffs into the lungs every 6 (six) hours as needed for wheezing.   Yes Historical Provider, MD  ALPRAZolam Duanne Moron) 1 MG tablet Take 1-2  mg by mouth 2 (two) times daily as needed for anxiety.   Yes Historical Provider, MD  amLODipine (NORVASC) 5 MG tablet Take 5 mg by mouth daily.   Yes Historical Provider, MD  buPROPion (WELLBUTRIN SR) 150 MG 12 hr tablet Take 150 mg by mouth 2 (two) times daily.   Yes Historical Provider, MD  Linaclotide (LINZESS) 290 MCG CAPS capsule Take 290 mcg by mouth daily.   Yes Historical Provider, MD  metoprolol succinate (TOPROL-XL) 50 MG 24 hr tablet Take 50 mg by mouth daily.   Yes Historical Provider, MD  oxyCODONE-acetaminophen (PERCOCET) 10-325 MG per tablet Take 1 tablet by mouth every 4 (four) hours as needed for pain.   Yes Historical Provider, MD  ondansetron (ZOFRAN) 4 MG tablet Take 1 tablet (4 mg total) by mouth every 8 (eight) hours as needed for nausea. 10/31/12   Melina Schools, MD  oxyCODONE-acetaminophen (PERCOCET) 7.5-325 MG per tablet Take 1 tablet by mouth every 6 (six) hours as needed for pain. 04/21/13   Benjiman Core, PA-C   Physical Exam: Blood pressure 89/56, pulse 93, temperature 99 F (37.2 C),  temperature source Oral, resp. rate 18, height 5\' 4"  (1.626 m), weight 62.4 kg (137 lb 9.1 oz), SpO2 97.00%. Filed Vitals:   04/22/13 1040  BP: 89/56  Pulse: 93  Temp: 99 F (37.2 C)  Resp: 18     General:  Alert and oriented x3 no acute distress  Eyes: Anicteric no power  ENT: Dry mucous membranes  Neck: Brace  Cardiovascular: Regular rate and rhythm with positive S1, I cannot here her S2 no murmurs rubs gallops  Respiratory: Good air movement clear to auscultation  Abdomen: Positive bowel sounds nontender nondistended and soft  Skin: Intact  Musculoskeletal: Intact  Psychiatric: Appropriate  Neurologic: grossly  nonfocal  Labs on Admission:  Basic Metabolic Panel:  Recent Labs Lab 04/17/13 1220 04/22/13 1135  NA 140 135*  K 3.9 5.0  CL 100 98  CO2 22 24  GLUCOSE 201* 134*  BUN 9 8  CREATININE 0.66 0.64  CALCIUM 9.2 9.5   Liver Function Tests: No results found for this basename: AST, ALT, ALKPHOS, BILITOT, PROT, ALBUMIN,  in the last 168 hours No results found for this basename: LIPASE, AMYLASE,  in the last 168 hours No results found for this basename: AMMONIA,  in the last 168 hours CBC:  Recent Labs Lab 04/17/13 1220 04/22/13 1135  WBC 15.2* 7.5  HGB 12.2 12.8  HCT 35.6* 36.6  MCV 87.9 86.9  PLT 286 354   Cardiac Enzymes: No results found for this basename: CKTOTAL, CKMB, CKMBINDEX, TROPONINI,  in the last 168 hours BNP: No components found with this basename: POCBNP,  CBG: No results found for this basename: GLUCAP,  in the last 168 hours  Radiological Exams on Admission: Dg Abd 1 View  04/22/2013   CLINICAL DATA:  Evaluate for ileus.  Abdominal pain.  EXAM: ABDOMEN - 1 VIEW  COMPARISON:  CT lumbar spine 10/08/2012  FINDINGS: There is a fairly prominent amount of gas throughout small bowel loops and throughout the entire length of colon and rectum, without pathologic distention. Findings likely reflect a mild diffuse ileus. There is no  evidence of bowel obstruction. There is a moderate amount of stool in the colon. Cholecystectomy clips are noted. Extensive postsurgical changes of spinal fusion noted in the thoracolumbar spine. Right hip total arthroplasty noted.  IMPRESSION: Bowel gas pattern suggests a mild ileus. No evidence of bowel obstruction.  Electronically Signed   By: Curlene Dolphin M.D.   On: 04/22/2013 09:03    EKG: Independently reviewed. pending Time spent: 80 minutes  Charlynne Cousins [facility] Pager [phone]  If 7PM-7AM, please contact night-coverage [email or web address] Password [email or web address] 04/22/2013, 1:39 PM

## 2013-04-22 NOTE — Progress Notes (Signed)
Physical Therapy Treatment Patient Details Name: [name] MRN: [id number] DOB: 06/29/1960 Today's Date: 04/22/2013 Time: 5621-3086 PT Time Calculation (min): 17 min  PT Assessment / Plan / Recommendation  History of Present Illness Pt admitted for removal of cervical hardware and collapsed cage in neck.  Pt underwent a C4-C6 fusion with C5 corpectomy.   Pt with post op R side weakness in UE and LE.  Pt did have preop foot drop in R foot but otherwise strength was [facility].  Pt has had R rotator cuff surgery, R hip replacement, R carpel tunnel surgery and was waiting to have L rotator cuff surgery.    PT Comments   Pt able to perform all mobility with RW with supervision.  Pt frustrated about d/c plan to SNF, states [name]  Follow Up Recommendations  SNF     Does the patient have the potential to tolerate intense rehabilitation     Barriers to Discharge        Equipment Recommendations  None recommended by PT    Recommendations for Other Services    Frequency Min 5X/week   Progress towards PT Goals Progress towards PT goals: Progressing toward goals  Plan Current plan remains appropriate    Precautions / Restrictions Precautions Precautions: Cervical;Fall Required Braces or Orthoses: Other Brace/Splint;Cervical Brace Cervical Brace: Hard collar;Soft collar Other Brace/Splint: Rt  blue rocker AFO Restrictions Weight Bearing Restrictions: No   Pertinent Vitals/Pain Pt c/o head pain when PT entered, RN aware and meds rec'd.    Mobility  Bed Mobility Overal bed mobility: Modified Independent General bed mobility comments: mod I with bed mobility supine <> sit Transfers Equipment used: Rolling walker (2 wheeled) Sit to Stand: Supervision General transfer comment: pt able to transfer with supervision with RW from bed and toilet, cues for cervical precautions Ambulation/Gait Ambulation/Gait assistance:  Supervision Ambulation Distance (Feet): 200 Feet Assistive device: Rolling walker (2 wheeled) Gait velocity interpretation: Below normal speed for age/gender General Gait Details: supervision with RW in room/bathroom as well as hallway, no foot drag noted with AFO    Exercises General Exercises - Lower Extremity Hip ABduction/ADduction: AROM;Both;10 reps Toe Raises: AROM;Both;10 reps Heel Raises: AROM;Both;10 reps Mini-Sqauts: AROM;Both;10 reps Other Exercises Other Exercises: hamstring curls x 10 B   PT Diagnosis:    PT Problem List:   PT Treatment Interventions:     PT Goals (current goals can now be found in the care plan section)    Visit Information  Last PT Received On: 04/22/13 Assistance Needed: +1 History of Present Illness: Pt admitted for removal of cervical hardware and collapsed cage in neck.  Pt underwent a C4-C6 fusion with C5 corpectomy.   Pt with post op R side weakness in UE and LE.  Pt did have preop foot drop in R foot but otherwise strength was [name].  Pt has had R rotator cuff surgery, R hip replacement, R carpel tunnel surgery and was waiting to have L rotator cuff surgery.     Subjective Data      Cognition  Cognition Arousal/Alertness: Awake/alert Behavior During Therapy: WFL for tasks assessed/performed Overall Cognitive Status: Within Functional Limits for tasks assessed    Balance  Balance Standing balance comment: Performed [place] for balance training  End of Session PT - End of Session Equipment Utilized During Treatment: Cervical collar Activity Tolerance: Patient tolerated treatment well Patient left: in bed;with call bell/phone within reach Nurse Communication: Mobility status  GP     [name] 04/22/2013, 11:28 AM

## 2013-04-22 NOTE — Progress Notes (Signed)
  Echocardiogram 2D Echocardiogram has been performed.  Donata Clay 04/22/2013, 5:04 PM

## 2013-04-23 DIAGNOSIS — M1610 Unilateral primary osteoarthritis, unspecified hip: Secondary | ICD-10-CM

## 2013-04-23 DIAGNOSIS — M169 Osteoarthritis of hip, unspecified: Secondary | ICD-10-CM

## 2013-04-23 LAB — BASIC METABOLIC PANEL
BUN: 7 mg/dL (ref 6–23)
CALCIUM: 9 mg/dL (ref 8.4–10.5)
CO2: 23 meq/L (ref 19–32)
Chloride: 103 mEq/L (ref 96–112)
Creatinine, Ser: 0.6 mg/dL (ref 0.50–1.10)
GFR calc Af Amer: >90 mL/min (ref >90)
GFR calc non Af Amer: >90 mL/min (ref >90)
GLUCOSE: 101 mg/dL — AB (ref 70–99)
Potassium: 4.4 mEq/L (ref 3.7–5.3)
Sodium: 139 mEq/L (ref 137–147)

## 2013-04-23 MED ORDER — FLEET ENEMA 7-19 GM/118ML RE ENEM: 1.0000 | ENEMA | Freq: Once | RECTAL | Status: DC

## 2013-04-23 MED ORDER — MAGNESIUM CITRATE PO SOLN: 0.5000 | Freq: Once | ORAL | Status: DC

## 2013-04-23 MED ORDER — SODIUM CHLORIDE 0.9 % IV SOLN
INTRAVENOUS | Status: AC
  Administered 2013-04-23: 100 mL/h via INTRAVENOUS
  Administered 2013-04-23: 12:00:00 via INTRAVENOUS
  Administered 2013-04-24: 100 mL/h via INTRAVENOUS

## 2013-04-23 NOTE — Progress Notes (Signed)
Patient ID: [name], female   DOB: November 04, 1960, 53 y.o.   MRN: [id number]    Subjective: 7 Days Post-Op Procedure(s) (LRB): REMOVAL CERVICAL PLATES AND INTERBODY CAGE/POSTERIOR CERVICAL SPINAL FUSION C4 - C6/C5 CORPECTOMY/C4 - C6 FUSION WITH ILIAC CREST BONE GRAFT (N/A) Patient reports pain as mild.   No bm yet.  Wants to d/c home tomorrow instead of going to rehab.   Objective: Vital signs in last 24 hours: Temp:  [97.1 F (36.2 C)-97.8 F (36.6 C)] 97.1 F (36.2 C) (02/11 1400) Pulse Rate:  [68-87] 68 (02/11 1400) Resp:  [16-18] 16 (02/11 1400) BP: (99-127)/(64-79) 118/70 mmHg (02/11 1400) SpO2:  [98 %-99 %] 99 % (02/11 1400)  Intake/Output from previous day: 02/10 0701 - 02/11 0700 In: 720 [P.O.:720] Out: -  Intake/Output this shift: Total I/O In: 618 [P.O.:518; I.V.:100] Out: 1 [Urine:1]  Labs:  Recent Labs  04/22/13 1135  HGB 12.8    Recent Labs  04/22/13 1135  WBC 7.5  RBC 4.21  HCT 36.6  PLT 354    Recent Labs  04/22/13 1135 04/23/13 0403  NA 135* 139  K 5.0 4.4  CL 98 103  CO2 24 23  BUN 8 7  CREATININE 0.64 0.60  GLUCOSE 134* 101*  CALCIUM 9.5 9.0   No results found for this basename: LABPT, INR,  in the last 72 hours  Physical Exam: Neurologically intact.  Anterior/posterior neck wounds look good.  No drainage or signs of infection.    Assessment/Plan: 7 Days Post-Op Procedure(s) (LRB): REMOVAL CERVICAL PLATES AND INTERBODY CAGE/POSTERIOR CERVICAL SPINAL FUSION C4 - C6/C5 CORPECTOMY/C4 - C6 FUSION WITH ILIAC CREST BONE GRAFT (N/A) Mild postop ileus Plan for discharge tomorrow if medically stable and has bowel movement. Will give another fleet enema and 1/2 bottle of mag citrate.    Lanae Crumbly for Dr. Melina Schools [facility] [phone] 04/23/2013, 5:56 PM

## 2013-04-23 NOTE — Progress Notes (Signed)
Occupational Therapy Treatment Patient Details Name: SARRINAH GARDIN MRN: [id number] DOB: 06-Jun-1960 Today's Date: 04/23/2013 Time: 3500-9381 OT Time Calculation (min): 11 min  OT Assessment / Plan / Recommendation  History of present illness Pt admitted for removal of cervical hardware and collapsed cage in neck.  Pt underwent a C4-C6 fusion with C5 corpectomy.   Pt with post op R side weakness in UE and LE.  Pt did have preop foot drop in R foot but otherwise strength was [facility].  Pt has had R rotator cuff surgery, R hip replacement, R carpel tunnel surgery and was waiting to have L rotator cuff surgery.    OT comments  Pt. Reports more pain today, states she just finished a shower so that may have something to do with it.  Tolerating r hand tasks for con't. Strengthening.  Does report some con't. Spillage with self feeding but does con't. To try to incorporate r hand into functional tasks.  Follow Up Recommendations  CIR;Supervision/Assistance - 24 hour;Outpatient OT -SW please discuss d/c plan with pt. Note P.T. Had discussed SNF.  Pt. Has a very complicated home situation involving caring for g.children and helping her son.  She reports today that she is no longer agreeable to snf and needs to get home asap secondary to son having health problems so she will be needed to care for the children sooner than expected.                 Recommendations for Other Services Rehab consult  Frequency Min 3X/week   Progress towards OT Goals Progress towards OT goals: Progressing toward goals  Plan Discharge plan remains appropriate    Precautions / Restrictions Precautions Precautions: Cervical;Fall Precaution Comments: pt reports multiple recent falls at home; reviewed cervical precautions. Required Braces or Orthoses: Other Brace/Splint;Cervical Brace Cervical Brace: Hard collar;Soft collar Other Brace/Splint: Rt  blue rocker AFO   Pertinent Vitals/Pain Rates pain higher, requests  meds-verbalizes that the meds here are not the strength she takes at home and feels she is not getting adequate relief.    ADL  Lower Body Dressing: Performed;Set up Where Assessed - Lower Body Dressing: Supported sitting ADL Comments: able to don/doff socks while seated by crossing L/R legs over knees.  performed some fine motor and in hand manipulation, grasp tasks with r hand.  pt. tolerating well, states some spilage still noted with feeding.     OT Goals(current goals can now be found in the care plan section)    Visit Information  Last OT Received On: 04/23/13 History of Present Illness: Pt admitted for removal of cervical hardware and collapsed cage in neck.  Pt underwent a C4-C6 fusion with C5 corpectomy.   Pt with post op R side weakness in UE and LE.  Pt did have preop foot drop in R foot but otherwise strength was [facility].  Pt has had R rotator cuff surgery, R hip replacement, R carpel tunnel surgery and was waiting to have L rotator cuff surgery.     Subjective Data   "i need to get home, my son is at the doctor right now and will need sx./tx. i have to get home"          Cognition  Cognition Arousal/Alertness: Awake/alert Behavior During Therapy: WFL for tasks assessed/performed Overall Cognitive Status: Within Functional Limits for tasks assessed                     End of Session OT -  End of Session Equipment Utilized During Treatment: Cervical collar Activity Tolerance: Patient tolerated treatment well Patient left: in chair;with call bell/phone within reach       Janice Coffin, COTA/L 04/23/2013, 10:56 AM

## 2013-04-23 NOTE — Progress Notes (Signed)
Physical Therapy Treatment Patient Details Name: [name] MRN: [id number] DOB: Mar 20, 1960 Today's Date: 04/23/2013 Time: 7616-0737 PT Time Calculation (min): 23 min  PT Assessment / Plan / Recommendation  History of Present Illness Pt admitted for removal of cervical hardware and collapsed cage in neck.  Pt underwent a C4-C6 fusion with C5 corpectomy.   Pt with post op R side weakness in UE and LE.  Pt did have preop foot drop in R foot but otherwise strength was [facility].  Pt has had R rotator cuff surgery, R hip replacement, R carpel tunnel surgery and was waiting to have L rotator cuff surgery.    PT Comments   Pt is mod I with all mobility and transfers and stairs.  Pt no longer agreeable for SNF.  Pt is appropriate for outpatient PT for balance and strengthening.  Follow Up Recommendations  Outpatient PT (pt states she is no longer agreeable to SNF and wants to go home. pt is at mod I level so oupatient PT is apporpriate)     Does the patient have the potential to tolerate intense rehabilitation     Barriers to Discharge        Equipment Recommendations  None recommended by PT    Recommendations for Other Services    Frequency Min 5X/week   Progress towards PT Goals Progress towards PT goals: Goals met and updated - see care plan  Plan Discharge plan needs to be updated    Precautions / Restrictions Precautions Precautions: Cervical;Fall Precaution Comments: pt reports multiple recent falls at home; reviewed cervical precautions. Required Braces or Orthoses: Other Brace/Splint;Cervical Brace Cervical Brace: Hard collar;Soft collar Other Brace/Splint: Rt  blue rocker AFO Restrictions Weight Bearing Restrictions: No   Pertinent Vitals/Pain Pt c/o increased shoulder and neck pain today, repositioned appropriately    Mobility  Bed Mobility Overal bed mobility: Modified Independent Transfers Overall transfer level: Modified independent Equipment used: Rolling walker (2  wheeled) Sit to Stand: Modified independent (Device/Increase time) Stand pivot transfers: Modified independent (Device/Increase time) Ambulation/Gait Ambulation/Gait assistance: Modified independent (Device/Increase time) Ambulation Distance (Feet): 250 Feet Assistive device: Rolling walker (2 wheeled) Gait velocity interpretation: Below normal speed for age/gender General Gait Details: antalgic gait ? due to AFO Stairs assistance: Modified independent (Device/Increase time) Stair Management: One rail Right Number of Stairs: 5 General stair comments: no LOB    Exercises     PT Diagnosis:    PT Problem List:   PT Treatment Interventions:     PT Goals (current goals can now be found in the care plan section)    Visit Information  Last PT Received On: 04/23/13 Assistance Needed: +1 History of Present Illness: Pt admitted for removal of cervical hardware and collapsed cage in neck.  Pt underwent a C4-C6 fusion with C5 corpectomy.   Pt with post op R side weakness in UE and LE.  Pt did have preop foot drop in R foot but otherwise strength was [facility].  Pt has had R rotator cuff surgery, R hip replacement, R carpel tunnel surgery and was waiting to have L rotator cuff surgery.     Subjective Data      Cognition  Cognition Arousal/Alertness: Awake/alert Behavior During Therapy: WFL for tasks assessed/performed Overall Cognitive Status: Within Functional Limits for tasks assessed    Balance     End of Session PT - End of Session Equipment Utilized During Treatment: Cervical collar Activity Tolerance: Patient tolerated treatment well Patient left: in bed;with call bell/phone within  reach Nurse Communication: Mobility status   GP     [name] 04/23/2013, 12:19 PM

## 2013-04-23 NOTE — Progress Notes (Signed)
[facility] PROGRESS NOTE  [name] KNL:[id number] DOB: 05/06/1960 DOA: 04/16/2013 PCP: Margarita Rana, MD  Assessment/Plan: 1.Orthostatic hypotension/Dizziness -Improved off Norvasc -Echocardiogram with EF 60-65%, normal wall motion. -Will hydrate with IV fluids -Follow recheck orthostatics in a 2. Status post neck surgery/neck pain -per Orthopedics  Code Status: full Family Communication: None at bedside Disposition Plan: Per orthopedics      HPI/Subjective: states still with some Dizziness with sitting or standing but better.  Objective: Filed Vitals:   04/23/13 1400  BP: 118/70  Pulse: 68  Temp: 97.1 F (36.2 C)  Resp: 16    Intake/Output Summary (Last 24 hours) at 04/23/13 1820 Last data filed at 04/23/13 1500  Gross per 24 hour  Intake    618 ml  Output      1 ml  Net    617 ml   Filed Weights   04/16/13 2300  Weight: 62.4 kg (137 lb 9.1 oz)    Exam:  General: alert & oriented x 3 In NAD with cervical collar in place Cardiovascular: RRR, nl S1 s2 Respiratory: CTAB Abdomen: soft +BS NT/ND, no masses palpable Extremities: No cyanosis and no edema    Data Reviewed: Basic Metabolic Panel:  Recent Labs Lab 04/17/13 1220 04/22/13 1135 04/23/13 0403  NA 140 135* 139  K 3.9 5.0 4.4  CL 100 98 103  CO2 22 24 23   GLUCOSE 201* 134* 101*  BUN 9 8 7   CREATININE 0.66 0.64 0.60  CALCIUM 9.2 9.5 9.0   Liver Function Tests: No results found for this basename: AST, ALT, ALKPHOS, BILITOT, PROT, ALBUMIN,  in the last 168 hours No results found for this basename: LIPASE, AMYLASE,  in the last 168 hours No results found for this basename: AMMONIA,  in the last 168 hours CBC:  Recent Labs Lab 04/17/13 1220 04/22/13 1135  WBC 15.2* 7.5  HGB 12.2 12.8  HCT 35.6* 36.6  MCV 87.9 86.9  PLT 286 354   Cardiac Enzymes: No results found for this basename: CKTOTAL, CKMB, CKMBINDEX, TROPONINI,  in the last 168 hours BNP (last 3 results) No  results found for this basename: PROBNP,  in the last 8760 hours CBG: No results found for this basename: GLUCAP,  in the last 168 hours  No results found for this or any previous visit (from the past 240 hour(s)).   Studies: Dg Abd 1 View  04/22/2013   CLINICAL DATA:  Evaluate for ileus.  Abdominal pain.  EXAM: ABDOMEN - 1 VIEW  COMPARISON:  CT lumbar spine 10/08/2012  FINDINGS: There is a fairly prominent amount of gas throughout small bowel loops and throughout the entire length of colon and rectum, without pathologic distention. Findings likely reflect a mild diffuse ileus. There is no evidence of bowel obstruction. There is a moderate amount of stool in the colon. Cholecystectomy clips are noted. Extensive postsurgical changes of spinal fusion noted in the thoracolumbar spine. Right hip total arthroplasty noted.  IMPRESSION: Bowel gas pattern suggests a mild ileus. No evidence of bowel obstruction.   Electronically Signed   By: Curlene Dolphin M.D.   On: 04/22/2013 09:03    Scheduled Meds: . buPROPion  150 mg Oral BID  . Linaclotide  290 mcg Oral Daily  . magnesium citrate  0.5 Bottle Oral Once  . magnesium citrate  0.5 Bottle Oral Once  . metoprolol succinate  50 mg Oral Daily  . polyethylene glycol  17 g Oral Daily  . sodium phosphate  1 enema  Rectal Once   Continuous Infusions: . sodium chloride 100 mL/hr at 04/23/13 1213  . lactated ringers      Active Problems:   Neck pain   Orthostatic hypotension   Dizziness    Time spent: [facility] Pager [phone]. If 7PM-7AM, please contact night-coverage at [email or web address], password [facility] 04/23/2013, 6:20 PM  LOS: 7 days

## 2013-04-24 MED ORDER — DOCUSATE SODIUM 100 MG PO CAPS: 100.0000 mg | ORAL_CAPSULE | Freq: Two times a day (BID) | ORAL | Status: DC

## 2013-04-24 MED ORDER — OXYCODONE-ACETAMINOPHEN 10-325 MG PO TABS: 1.0000 | ORAL_TABLET | ORAL | Status: DC | PRN

## 2013-04-24 MED ORDER — AMLODIPINE BESYLATE 5 MG PO TABS: 5.0000 mg | ORAL_TABLET | Freq: Every day | ORAL | Status: DC

## 2013-04-24 MED ORDER — ONDANSETRON 4 MG PO TBDP: 4.0000 mg | ORAL_TABLET | Freq: Three times a day (TID) | ORAL | Status: DC | PRN

## 2013-04-24 MED ORDER — POLYETHYLENE GLYCOL 3350 17 G PO PACK: 17.0000 g | PACK | Freq: Every day | ORAL | Status: DC

## 2013-04-24 MED ORDER — OXYCODONE HCL 5 MG PO TABS: ORAL_TABLET | ORAL | Status: DC

## 2013-04-24 MED ORDER — METHOCARBAMOL 500 MG PO TABS: 500.0000 mg | ORAL_TABLET | Freq: Four times a day (QID) | ORAL | Status: DC | PRN

## 2013-04-24 NOTE — Discharge Instructions (Signed)

## 2013-04-24 NOTE — Progress Notes (Signed)
Occupational Therapy Treatment Patient Details Name: [name] MRN: [id number] DOB: Mar 29, 1960 Today's Date: 04/24/2013 Time: 4627-0350 OT Time Calculation (min): 31 min  OT Assessment / Plan / Recommendation  History of present illness Pt admitted for removal of cervical hardware and collapsed cage in neck.  Pt underwent a C4-C6 fusion with C5 corpectomy.   Pt with post op R side weakness in UE and LE.  Pt did have preop foot drop in R foot but otherwise strength was [facility].  Pt has had R rotator cuff surgery, R hip replacement, R carpel tunnel surgery and was waiting to have L rotator cuff surgery.    OT comments  This 53 yo making great progress and can now go home with [place]. No further acute OT needs, all education completed, we will sign off.  Follow Up Recommendations  Home health OT       Equipment Recommendations  None recommended by OT       Frequency Min 3X/week   Progress towards OT Goals Progress towards OT goals: Progressing toward goals  Plan Discharge plan remains appropriate    Precautions / Restrictions Precautions Precautions: Cervical Precaution Comments: pt reports multiple recent falls at home; reviewed cervical precautions. Required Braces or Orthoses: Other Brace/Splint;Cervical Brace Cervical Brace: Hard collar Other Brace/Splint: Rt  blue rocker AFO Restrictions Weight Bearing Restrictions: No   Pertinent Vitals/Pain C/o of left side of neck hurting her intermittently, I re-adjusted collar vertically (showing her how) and she said that this was much more supportive, as well as gave her a heat pack to put on her left rhomboid area.    ADL  ADL Comments: Pt reports that she is not concerned about BADLs at home. Made her aware how the cervical collar works as far as adjusting it vertically and horizontally and changing out/cleaning the pads.      OT Goals(current goals can now be found in the care plan section) Acute Rehab OT Goals Patient Stated  Goal: home   Visit Information  Last OT Received On: 04/24/13 Assistance Needed: +1 History of Present Illness: Pt admitted for removal of cervical hardware and collapsed cage in neck.  Pt underwent a C4-C6 fusion with C5 corpectomy.   Pt with post op R side weakness in UE and LE.  Pt did have preop foot drop in R foot but otherwise strength was [facility].  Pt has had R rotator cuff surgery, R hip replacement, R carpel tunnel surgery and was waiting to have L rotator cuff surgery.           Cognition  Cognition Arousal/Alertness: Awake/alert Behavior During Therapy: WFL for tasks assessed/performed Overall Cognitive Status: Within Functional Limits for tasks assessed       Exercises  Other Exercises Other Exercises: Pt still reports that her RUE is weak/feels heavy. She can move it throughout he normal range. Tested elbow to digits and overall she is a 3-3+/5. Gave pt a handout on exercises for elbow,forearm, wrist starting with using a "Campbell[employer]" soup can and progressing repeitiions from 10 up to 30 and then increasing ounces. Also gave her some theraputty exercises and issued her a container of red putty. Also encouraged pt to continue to use her RUE for all BADLs and IADLs so as to increase her strength as well.      End of Session OT - End of Session Equipment Utilized During Treatment: Cervical collar Activity Tolerance: Patient tolerated treatment well Patient left: in chair  Almon Register [phone] 04/24/2013, 1:52 PM

## 2013-04-24 NOTE — Progress Notes (Signed)
Physical Therapy Treatment Patient Details Name: [name] MRN: [id number] DOB: [birth year] Today's Date: 04/24/2013 Time: 3545-6256 PT Time Calculation (min): 24 min  PT Assessment / Plan / Recommendation  History of Present Illness Pt admitted for removal of cervical hardware and collapsed cage in neck.  Pt underwent a C4-C6 fusion with C5 corpectomy.   Pt with post op R side weakness in UE and LE.  Pt did have preop foot drop in R foot but otherwise strength was [facility].  Pt has had R rotator cuff surgery, R hip replacement, R carpel tunnel surgery and was waiting to have L rotator cuff surgery.    PT Comments   Pt mod I to independent for all mobility today. Pt demo good technique and adherence to cervical neck precautions. All education completed at this time. Will recommend OPPT for pt for Rt drop foot when medically cleared by MD. Will sign off on pt at this time. Encouraged to cont ambulating 2-3x's daily with nursing.   Follow Up Recommendations  Outpatient PT     Does the patient have the potential to tolerate intense rehabilitation     Barriers to Discharge        Equipment Recommendations  None recommended by PT    Recommendations for Other Services    Frequency Min 5X/week   Progress towards PT Goals Progress towards PT goals: Goals met/education completed, patient discharged from PT  Plan Current plan remains appropriate    Precautions / Restrictions Precautions Precautions: Cervical;Fall Precaution Comments: pt reports multiple recent falls at home; reviewed cervical precautions. Required Braces or Orthoses: Other Brace/Splint;Cervical Brace Cervical Brace: Hard collar Other Brace/Splint: Rt  blue rocker AFO Restrictions Weight Bearing Restrictions: No   Pertinent Vitals/Pain C/o pain in Lt shoulder 6/10; premedicated and RN notified.    Mobility  Bed Mobility General bed mobility comments: not assessed; pt sitting in recliner and returned to recliner   Transfers Overall transfer level: Modified independent Equipment used: Rolling walker (2 wheeled) Transfers: Sit to/from Stand Sit to Stand: Modified independent (Device/Increase time) General transfer comment: effortful but pt able to perform without physical (A) and demo good technique and adheres to cervical precautions  Ambulation/Gait Ambulation/Gait assistance: Modified independent (Device/Increase time) Ambulation Distance (Feet): 500 Feet Assistive device: Rolling walker (2 wheeled) Gait Pattern/deviations: WFL(Within Functional Limits) Gait velocity: decreased; guarded  Gait velocity interpretation: Below normal speed for age/gender General Gait Details: pt preferred to ambulate without AFO today; no drop foot noted; pt reports she can "mentally keep right foot from dropping" ; demo good technique with RW and no LOB noted  Stairs: Yes Stairs assistance: Modified independent (Device/Increase time) Stair Management: Two rails;Step to pattern;Forwards Number of Stairs: 5 General stair comments: no LOB; demo good technique          PT Diagnosis:    PT Problem List:   PT Treatment Interventions:     PT Goals (current goals can now be found in the care plan section) Acute Rehab PT Goals Patient Stated Goal: home with my grandkids  PT Goal Formulation: With patient Time For Goal Achievement: 05/01/13 Potential to Achieve Goals: Good  Visit Information  Last PT Received On: 04/24/13 Assistance Needed: +1 History of Present Illness: Pt admitted for removal of cervical hardware and collapsed cage in neck.  Pt underwent a C4-C6 fusion with C5 corpectomy.   Pt with post op R side weakness in UE and LE.  Pt did have preop foot drop in R foot but  otherwise strength was [facility].  Pt has had R rotator cuff surgery, R hip replacement, R carpel tunnel surgery and was waiting to have L rotator cuff surgery.     Subjective Data  Subjective: "i want to practice everything again but i need to  go to the bathroom first" Patient Stated Goal: home with my grandkids    Cognition  Cognition Arousal/Alertness: Awake/alert Behavior During Therapy: WFL for tasks assessed/performed Overall Cognitive Status: Within Functional Limits for tasks assessed    Balance  Balance Overall balance assessment: History of Falls;Modified Independent Sitting-balance support: Feet supported;No upper extremity supported Sitting balance-Leahy Scale: Good Standing balance support: Bilateral upper extremity supported;During functional activity;No upper extremity supported Standing balance-Leahy Scale: Good Standing balance comment: can stand without UE supported; no sway or LOB noted  General Comments General comments (skin integrity, edema, etc.): c/o pain in Lt shoulder today   End of Session PT - End of Session Equipment Utilized During Treatment: Cervical collar Activity Tolerance: Patient tolerated treatment well Patient left: in chair;with call bell/phone within reach Nurse Communication: Mobility status   GP     Gustavus Bryant, [place] [phone] 04/24/2013, 11:10 AM

## 2013-04-24 NOTE — Progress Notes (Signed)
Subjective: Doing well.  Pain controlled.  Had a BM yesterday.      Objective: Vital signs in last 24 hours: Temp:  [97.1 F (36.2 C)-98.6 F (37 C)] 98.6 F (37 C) (02/11 2203) Pulse Rate:  [68-87] 70 (02/11 2203) Resp:  [16-18] 18 (02/11 2203) BP: (99-127)/(65-79) 110/65 mmHg (02/11 2203) SpO2:  [99 %] 99 % (02/11 2203)  Intake/Output from previous day: 02/11 0701 - 02/12 0700 In: 1431.3 [P.O.:668; I.V.:763.3] Out: 1 [Urine:1] Intake/Output this shift:     Recent Labs  04/22/13 1135  HGB 12.8    Recent Labs  04/22/13 1135  WBC 7.5  RBC 4.21  HCT 36.6  PLT 354    Recent Labs  04/22/13 1135 04/23/13 0403  NA 135* 139  K 5.0 4.4  CL 98 103  CO2 24 23  BUN 8 7  CREATININE 0.64 0.60  GLUCOSE 134* 101*  CALCIUM 9.5 9.0   No results found for this basename: LABPT, INR,  in the last 72 hours  Exam:  Wounds looks good.  Neurologically intact.   Assessment/Plan: D/c home today if medically stable.  F/u dr Rolena Infante one week.    [name] M 04/24/2013, 7:41 AM

## 2013-04-24 NOTE — Progress Notes (Signed)
[facility] PROGRESS NOTE  KINLEY DOZIER ASN:[id number] DOB: 1960/10/19 DOA: 04/16/2013 PCP: Margarita Rana, MD  Assessment/Plan: 1.Orthostatic hypotension/Dizziness -Improved off Norvasc -Echocardiogram with EF 60-65%, normal wall motion. -Orthostasis resolved with hydration and follow up today, patient clinically improved -Okay to DC from medicine standpoint -She is to followup with her PCP. Blood pressures are well controlled on metoprolol and Norvasc has been DC'd-this was discussed with patient. She is to followup with her PCP for further monitoring of her blood pressures and further adjustment of her medications as clinically appropriate. 2. Status post neck surgery/neck pain -per Orthopedics  Code Status: full Family Communication: None at bedside Disposition Plan: Per orthopedics      HPI/Subjective: Feeling much better today, much less dizzy. Sitting up in chair.  Objective: Filed Vitals:   04/24/13 0825  BP:   Pulse:   Temp: 98.2 F (36.8 C)  Resp:     Intake/Output Summary (Last 24 hours) at 04/24/13 1055 Last data filed at 04/24/13 0800  Gross per 24 hour  Intake 2708.33 ml  Output      0 ml  Net 2708.33 ml   Filed Weights   04/16/13 2300  Weight: 62.4 kg (137 lb 9.1 oz)    Exam:  General: alert & oriented x 3 In NAD with cervical collar in place Cardiovascular: RRR, nl S1 s2 Respiratory: CTAB Abdomen: soft +BS NT/ND, no masses palpable Extremities: No cyanosis and no edema    Data Reviewed: Basic Metabolic Panel:  Recent Labs Lab 04/17/13 1220 04/22/13 1135 04/23/13 0403  NA 140 135* 139  K 3.9 5.0 4.4  CL 100 98 103  CO2 22 24 23   GLUCOSE 201* 134* 101*  BUN 9 8 7   CREATININE 0.66 0.64 0.60  CALCIUM 9.2 9.5 9.0   Liver Function Tests: No results found for this basename: AST, ALT, ALKPHOS, BILITOT, PROT, ALBUMIN,  in the last 168 hours No results found for this basename: LIPASE, AMYLASE,  in the last 168 hours No results  found for this basename: AMMONIA,  in the last 168 hours CBC:  Recent Labs Lab 04/17/13 1220 04/22/13 1135  WBC 15.2* 7.5  HGB 12.2 12.8  HCT 35.6* 36.6  MCV 87.9 86.9  PLT 286 354   Cardiac Enzymes: No results found for this basename: CKTOTAL, CKMB, CKMBINDEX, TROPONINI,  in the last 168 hours BNP (last 3 results) No results found for this basename: PROBNP,  in the last 8760 hours CBG: No results found for this basename: GLUCAP,  in the last 168 hours  No results found for this or any previous visit (from the past 240 hour(s)).   Studies: No results found.  Scheduled Meds: . buPROPion  150 mg Oral BID  . Linaclotide  290 mcg Oral Daily  . magnesium citrate  0.5 Bottle Oral Once  . magnesium citrate  0.5 Bottle Oral Once  . metoprolol succinate  50 mg Oral Daily  . polyethylene glycol  17 g Oral Daily  . sodium phosphate  1 enema Rectal Once   Continuous Infusions: . sodium chloride 100 mL/hr (04/24/13 0656)  . lactated ringers      Active Problems:   Neck pain   Orthostatic hypotension   Dizziness    Time spent: [facility] Pager [phone]. If 7PM-7AM, please contact night-coverage at [email or web address], password [facility] 04/24/2013, 10:55 AM  LOS: 8 days

## 2013-05-09 ENCOUNTER — Ambulatory Visit: Payer: Self-pay | Admitting: Pain Medicine

## 2013-07-11 ENCOUNTER — Ambulatory Visit: Payer: Self-pay | Admitting: Pain Medicine

## 2013-09-05 ENCOUNTER — Ambulatory Visit: Payer: Self-pay | Admitting: Pain Medicine

## 2013-11-21 ENCOUNTER — Ambulatory Visit: Payer: Self-pay | Admitting: Pain Medicine

## 2014-02-21 IMAGING — CR DG CERVICAL SPINE 2 OR 3 VIEWS
2 series · 2 of 2 positions shown · non-contrast
Comparison: Intraoperative radiographs 0490 hr the same day.

CLINICAL DATA: 52-year-old female status post cervical fusion.

EXAM:
CERVICAL SPINE - 2-3 VIEW

[AP (1 of 2)]
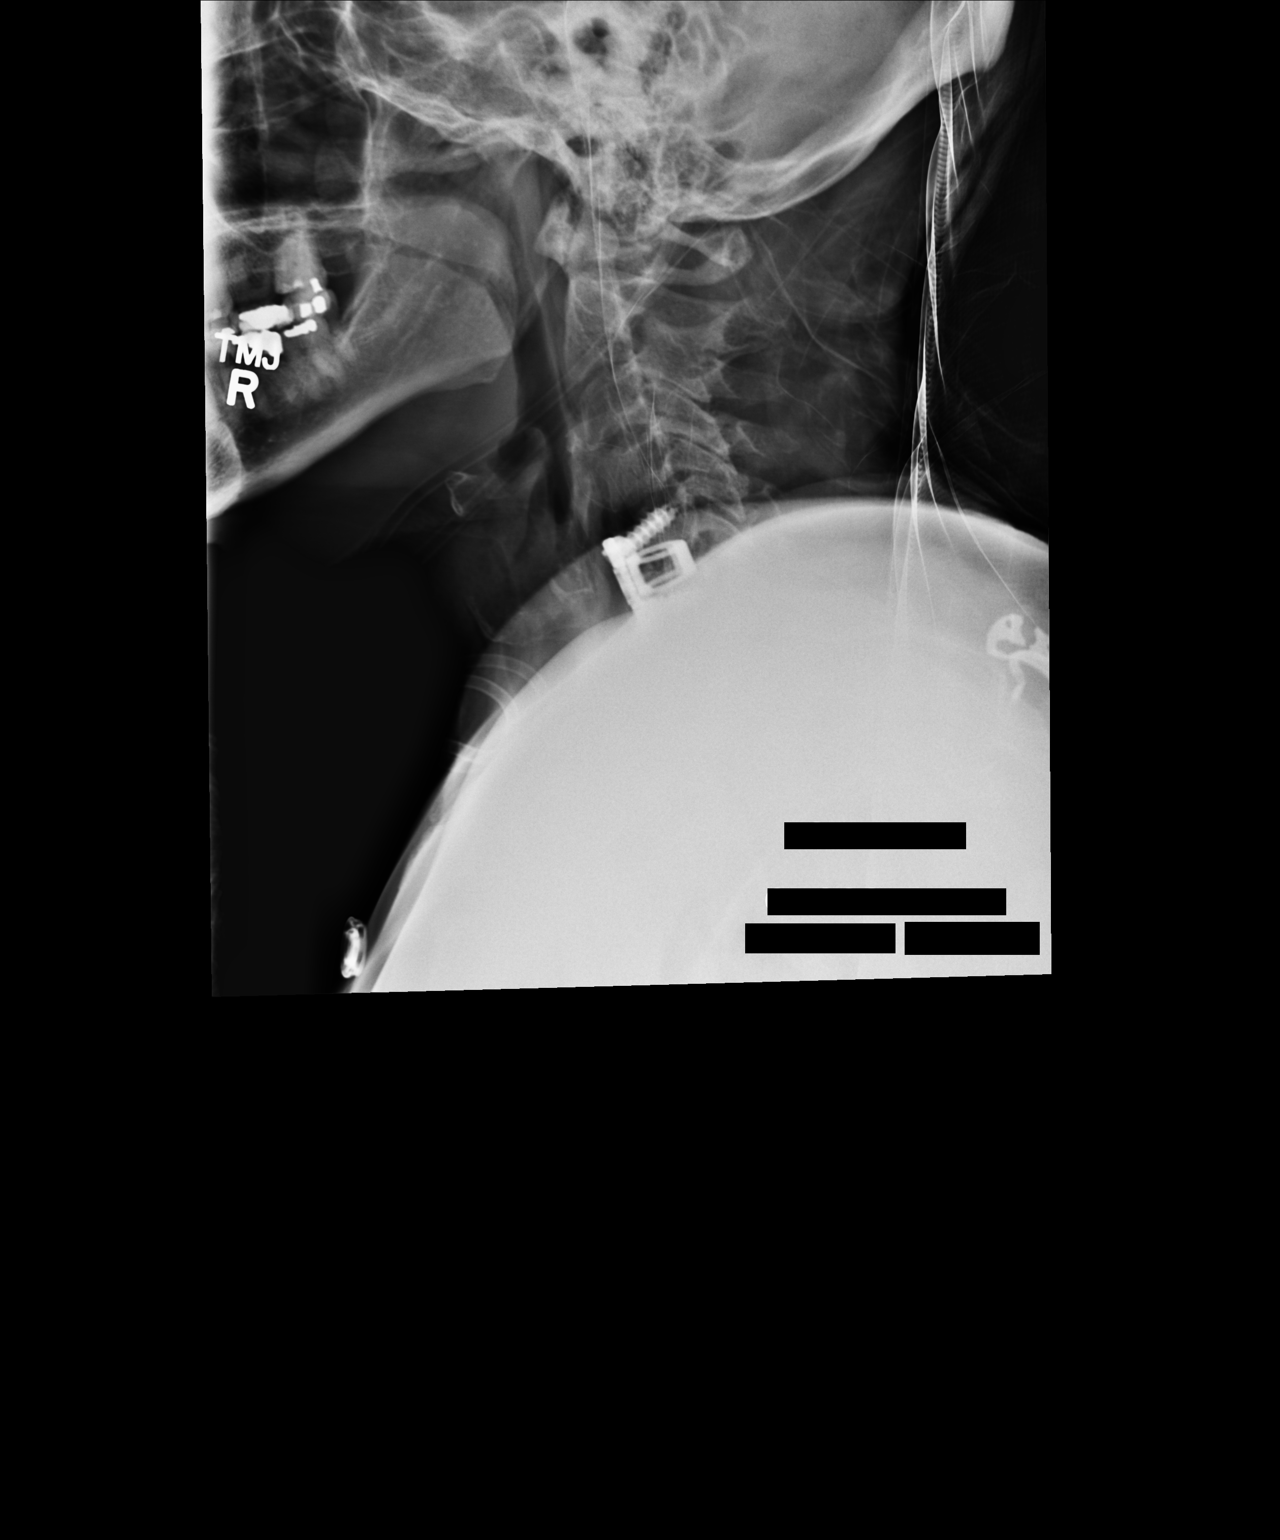

[AP (2 of 2)]
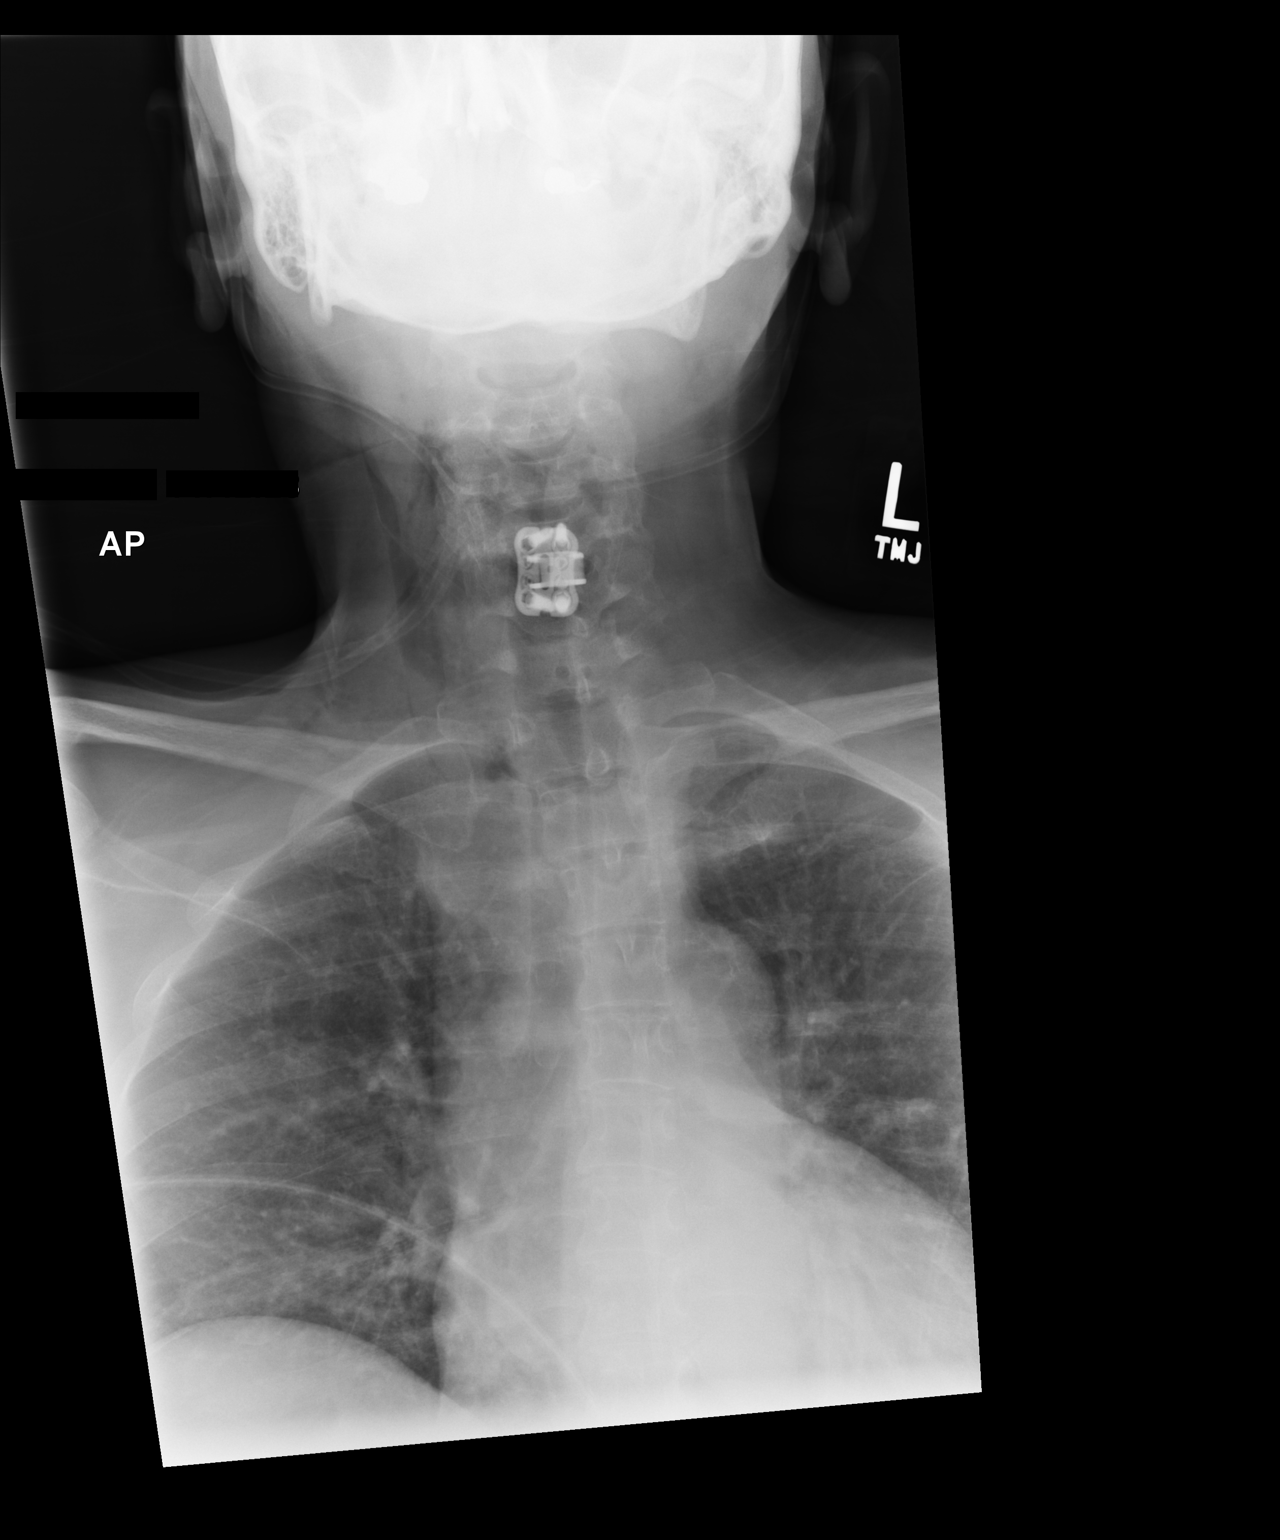

[2 of 2 positions shown; findings below may reference images not displayed]

FINDINGS: Portable AP and cross-table lateral views of the cervical spine at
9999 hrs. C5-C6 ACDF with metallic interbody implant. C6 hardware
better visualized on the intraoperative views. Hardware appears
stable and intact. Postoperative gas in the right neck.
IMPRESSION: C5-C6 ACDF. C6 level hardware better visualized on intraoperative
fluoroscopy. No adverse features identified.

## 2014-04-08 DIAGNOSIS — M5136 Other intervertebral disc degeneration, lumbar region: Secondary | ICD-10-CM | POA: Diagnosis not present

## 2014-04-08 DIAGNOSIS — Z79899 Other long term (current) drug therapy: Secondary | ICD-10-CM | POA: Diagnosis not present

## 2014-04-08 DIAGNOSIS — F329 Major depressive disorder, single episode, unspecified: Secondary | ICD-10-CM | POA: Diagnosis not present

## 2014-04-08 DIAGNOSIS — M542 Cervicalgia: Secondary | ICD-10-CM | POA: Diagnosis not present

## 2014-06-05 ENCOUNTER — Ambulatory Visit: Payer: Self-pay | Admitting: Family Medicine

## 2014-06-05 DIAGNOSIS — H8110 Benign paroxysmal vertigo, unspecified ear: Secondary | ICD-10-CM | POA: Diagnosis not present

## 2014-06-05 DIAGNOSIS — R079 Chest pain, unspecified: Secondary | ICD-10-CM | POA: Diagnosis not present

## 2014-06-05 DIAGNOSIS — R05 Cough: Secondary | ICD-10-CM | POA: Diagnosis not present

## 2014-06-05 DIAGNOSIS — J45998 Other asthma: Secondary | ICD-10-CM | POA: Diagnosis not present

## 2014-06-05 DIAGNOSIS — J189 Pneumonia, unspecified organism: Secondary | ICD-10-CM | POA: Diagnosis not present

## 2014-06-24 DIAGNOSIS — R001 Bradycardia, unspecified: Secondary | ICD-10-CM | POA: Diagnosis not present

## 2014-06-24 DIAGNOSIS — Z882 Allergy status to sulfonamides status: Secondary | ICD-10-CM | POA: Diagnosis not present

## 2014-06-24 DIAGNOSIS — I10 Essential (primary) hypertension: Secondary | ICD-10-CM | POA: Diagnosis not present

## 2014-06-24 DIAGNOSIS — Z7952 Long term (current) use of systemic steroids: Secondary | ICD-10-CM | POA: Diagnosis not present

## 2014-06-24 DIAGNOSIS — J988 Other specified respiratory disorders: Secondary | ICD-10-CM | POA: Diagnosis not present

## 2014-06-24 DIAGNOSIS — Z885 Allergy status to narcotic agent status: Secondary | ICD-10-CM | POA: Diagnosis not present

## 2014-06-24 DIAGNOSIS — R0602 Shortness of breath: Secondary | ICD-10-CM | POA: Diagnosis not present

## 2014-06-24 DIAGNOSIS — Z880 Allergy status to penicillin: Secondary | ICD-10-CM | POA: Diagnosis not present

## 2014-06-24 DIAGNOSIS — Z79891 Long term (current) use of opiate analgesic: Secondary | ICD-10-CM | POA: Diagnosis not present

## 2014-06-24 DIAGNOSIS — R079 Chest pain, unspecified: Secondary | ICD-10-CM | POA: Diagnosis not present

## 2014-06-24 DIAGNOSIS — I771 Stricture of artery: Secondary | ICD-10-CM | POA: Diagnosis not present

## 2014-06-24 DIAGNOSIS — R0600 Dyspnea, unspecified: Secondary | ICD-10-CM | POA: Diagnosis not present

## 2014-07-02 DIAGNOSIS — J309 Allergic rhinitis, unspecified: Secondary | ICD-10-CM | POA: Diagnosis not present

## 2014-07-02 DIAGNOSIS — K219 Gastro-esophageal reflux disease without esophagitis: Secondary | ICD-10-CM | POA: Diagnosis not present

## 2014-07-02 DIAGNOSIS — J45909 Unspecified asthma, uncomplicated: Secondary | ICD-10-CM | POA: Diagnosis not present

## 2014-07-04 NOTE — Consult Note (Signed)
I spoke on the phone with Dr. Rolena Infante and the patient's pharmacy to coordinate and make sure she understands that Dr. Valla Leaver prescription is only for breakthrough pain and that she needs to continue taking the medications I prescribe, as prescribed. Dr. Valla Leaver tel. [phone]. Pharmacy tel. [phone].  Electronic Signatures: Noberto Retort (MD)  (Signed on 13-Feb-15 09:49)  Authored  Last Updated: 13-Feb-15 09:49 by Noberto Retort (MD)

## 2014-07-06 DIAGNOSIS — M545 Low back pain: Secondary | ICD-10-CM | POA: Diagnosis not present

## 2014-07-06 DIAGNOSIS — G8929 Other chronic pain: Secondary | ICD-10-CM | POA: Diagnosis not present

## 2014-07-06 DIAGNOSIS — Z79891 Long term (current) use of opiate analgesic: Secondary | ICD-10-CM | POA: Diagnosis not present

## 2014-07-06 DIAGNOSIS — M5030 Other cervical disc degeneration, unspecified cervical region: Secondary | ICD-10-CM | POA: Diagnosis not present

## 2014-07-06 DIAGNOSIS — M5382 Other specified dorsopathies, cervical region: Secondary | ICD-10-CM | POA: Diagnosis not present

## 2014-07-06 DIAGNOSIS — F329 Major depressive disorder, single episode, unspecified: Secondary | ICD-10-CM | POA: Diagnosis not present

## 2014-07-06 DIAGNOSIS — Z79899 Other long term (current) drug therapy: Secondary | ICD-10-CM | POA: Diagnosis not present

## 2014-08-18 DIAGNOSIS — J45909 Unspecified asthma, uncomplicated: Secondary | ICD-10-CM | POA: Insufficient documentation

## 2014-08-18 DIAGNOSIS — G4733 Obstructive sleep apnea (adult) (pediatric): Secondary | ICD-10-CM | POA: Insufficient documentation

## 2014-08-18 DIAGNOSIS — G4730 Sleep apnea, unspecified: Secondary | ICD-10-CM | POA: Insufficient documentation

## 2014-08-18 DIAGNOSIS — M25559 Pain in unspecified hip: Secondary | ICD-10-CM

## 2014-08-18 DIAGNOSIS — Z981 Arthrodesis status: Secondary | ICD-10-CM

## 2014-08-18 DIAGNOSIS — G4700 Insomnia, unspecified: Secondary | ICD-10-CM | POA: Insufficient documentation

## 2014-08-18 DIAGNOSIS — R519 Headache, unspecified: Secondary | ICD-10-CM

## 2014-08-18 DIAGNOSIS — M9979 Connective tissue and disc stenosis of intervertebral foramina of abdomen and other regions: Secondary | ICD-10-CM

## 2014-08-18 DIAGNOSIS — R5383 Other fatigue: Secondary | ICD-10-CM | POA: Insufficient documentation

## 2014-08-18 DIAGNOSIS — H8110 Benign paroxysmal vertigo, unspecified ear: Secondary | ICD-10-CM | POA: Insufficient documentation

## 2014-08-18 DIAGNOSIS — H538 Other visual disturbances: Secondary | ICD-10-CM | POA: Insufficient documentation

## 2014-08-18 DIAGNOSIS — R002 Palpitations: Secondary | ICD-10-CM | POA: Insufficient documentation

## 2014-08-18 DIAGNOSIS — F32A Depression, unspecified: Secondary | ICD-10-CM | POA: Insufficient documentation

## 2014-08-18 DIAGNOSIS — F329 Major depressive disorder, single episode, unspecified: Secondary | ICD-10-CM | POA: Insufficient documentation

## 2014-08-18 DIAGNOSIS — M5090 Cervical disc disorder, unspecified, unspecified cervical region: Secondary | ICD-10-CM

## 2014-08-18 DIAGNOSIS — K219 Gastro-esophageal reflux disease without esophagitis: Secondary | ICD-10-CM | POA: Insufficient documentation

## 2014-08-18 HISTORY — DX: Pain in unspecified hip: M25.559

## 2014-08-18 HISTORY — DX: Cervical disc disorder, unspecified, unspecified cervical region: M50.90

## 2014-08-18 HISTORY — DX: Headache, unspecified: R51.9

## 2014-08-18 HISTORY — DX: Connective tissue and disc stenosis of intervertebral foramina of abdomen and other regions: M99.79

## 2014-08-18 HISTORY — DX: Arthrodesis status: Z98.1

## 2014-08-20 ENCOUNTER — Ambulatory Visit (INDEPENDENT_AMBULATORY_CARE_PROVIDER_SITE_OTHER): Payer: Medicare Other | Admitting: Physician Assistant

## 2014-08-20 ENCOUNTER — Encounter: Payer: Self-pay | Admitting: Physician Assistant

## 2014-08-20 VITALS — BP 122/86 | HR 65 | Temp 97.9°F | Resp 16 | Wt 146.8 lb

## 2014-08-20 DIAGNOSIS — F411 Generalized anxiety disorder: Secondary | ICD-10-CM

## 2014-08-20 DIAGNOSIS — J4541 Moderate persistent asthma with (acute) exacerbation: Secondary | ICD-10-CM | POA: Diagnosis not present

## 2014-08-20 MED ORDER — HYDROCODONE-HOMATROPINE 5-1.5 MG/5ML PO SYRP: 5.0000 mL | ORAL_SOLUTION | Freq: Three times a day (TID) | ORAL | Status: DC | PRN

## 2014-08-20 MED ORDER — ALPRAZOLAM 1 MG PO TABS: 1.0000 mg | ORAL_TABLET | Freq: Two times a day (BID) | ORAL | Status: DC | PRN

## 2014-08-20 MED ORDER — LEVOFLOXACIN 500 MG PO TABS: 500.0000 mg | ORAL_TABLET | Freq: Every day | ORAL | Status: DC

## 2014-08-20 NOTE — Progress Notes (Signed)
Subjective:    Patient ID: AYJAH SHOW, female    DOB: 1960/10/22, 54 y.o.   MRN: [id number]  Cough This is a recurrent problem. The current episode started in the past 7 days. The problem has been gradually worsening. The problem occurs every few minutes. The cough is non-productive. Associated symptoms include a fever, nasal congestion, postnasal drip, rhinorrhea (productive of yellow mucous) and wheezing. Pertinent negatives include no chest pain, chills, ear congestion, ear pain, headaches, heartburn, hemoptysis, myalgias, rash, sore throat, shortness of breath, sweats or weight loss. The symptoms are aggravated by cold air and lying down. She has tried a beta-agonist inhaler, leukotriene antagonists, rest and steroid inhaler for the symptoms. The treatment provided no relief. Her past medical history is significant for asthma, bronchitis, environmental allergies and pneumonia. There is no history of bronchiectasis, COPD or emphysema.      Review of Systems  Constitutional: Positive for fever. Negative for chills and weight loss.  HENT: Positive for congestion, postnasal drip, rhinorrhea (productive of yellow mucous), sinus pressure and trouble swallowing. Negative for ear pain, nosebleeds, sneezing, sore throat, tinnitus and voice change.   Eyes: Negative for visual disturbance.  Respiratory: Positive for cough, choking (previously had neck surgery with "cage" placed around esophagus), chest tightness and wheezing. Negative for hemoptysis and shortness of breath.   Cardiovascular: Negative for chest pain, palpitations and leg swelling.  Gastrointestinal: Negative for heartburn, nausea, vomiting, diarrhea and constipation.  Musculoskeletal: Negative for myalgias, arthralgias, neck pain and neck stiffness.  Skin: Negative for rash.  Allergic/Immunologic: Positive for environmental allergies.  Neurological: Negative for syncope, weakness and headaches.       Objective:   Physical  Exam  Constitutional: She appears well-developed and well-nourished. No distress.  HENT:  Head: Normocephalic and atraumatic.  Right Ear: Hearing, tympanic membrane, external ear and ear canal normal.  Left Ear: Hearing, tympanic membrane, external ear and ear canal normal.  Nose: Nose normal. Right sinus exhibits no maxillary sinus tenderness and no frontal sinus tenderness. Left sinus exhibits no maxillary sinus tenderness and no frontal sinus tenderness.  Mouth/Throat: Uvula is midline, oropharynx is clear and moist and mucous membranes are normal. No oropharyngeal exudate.  Eyes: Conjunctivae are normal. Pupils are equal, round, and reactive to light. Right eye exhibits no discharge. Left eye exhibits no discharge.  Neck: Normal range of motion. Neck supple. No tracheal deviation present. No thyromegaly present.  Healed surgical scar noted on right side of trachea  Cardiovascular: Normal rate, regular rhythm, normal heart sounds and intact distal pulses.  Exam reveals no gallop and no friction rub.   No murmur heard. Pulmonary/Chest: Effort normal. No respiratory distress. She has wheezes (throughout middle lung fields bilaterally). She has no rales. She exhibits no tenderness.  Lymphadenopathy:    She has no cervical adenopathy.  Vitals reviewed.         Assessment & Plan:  1. RAD (reactive airway disease), moderate persistent, with acute exacerbation Worsening.  4th time she has had acute exacerbation this year.  States she is compliant with proair and advair.  She is also taking loratadine, flonase and singulair for control of allergies.  Will treat with levaquin and refer to pulmonology for further evaluation.  - levofloxacin (LEVAQUIN) 500 MG tablet; Take 1 tablet (500 mg total) by mouth daily.  Dispense: 7 tablet; Refill: 0 - HYDROcodone-homatropine (HYCODAN) 5-1.5 MG/5ML syrup; Take 5 mLs by mouth every 8 (eight) hours as needed for cough.  Dispense: 120 mL; Refill:  0 -  Ambulatory referral to Pulmonology  2. Anxiety state Stable. Pulled to refill medication.  - ALPRAZolam (XANAX) 1 MG tablet; Take 1-2 tablets (1-2 mg total) by mouth 2 (two) times daily as needed for anxiety.  Dispense: 30 tablet; Refill: 1

## 2014-08-20 NOTE — Patient Instructions (Signed)

## 2014-08-27 ENCOUNTER — Telehealth: Payer: Self-pay | Admitting: Physician Assistant

## 2014-08-27 DIAGNOSIS — F411 Generalized anxiety disorder: Secondary | ICD-10-CM

## 2014-08-27 MED ORDER — ALPRAZOLAM 1 MG PO TABS: ORAL_TABLET | ORAL | Status: DC

## 2014-08-27 NOTE — Telephone Encounter (Signed)
Patient advised RX will be ready for pick up at the front desk.

## 2014-08-27 NOTE — Telephone Encounter (Deleted)
I just filled a xanax Rx on 08/20/14.

## 2014-08-27 NOTE — Telephone Encounter (Signed)
This has been printed and will be up front for her to pick up.  Thanks! -JB

## 2014-08-27 NOTE — Telephone Encounter (Signed)
Pt contacted office for refill request on the following medications:  ALPRAZolam (XANAX) 1 MG.  [facility] [place].  CB#[phone].  Pt states she is taking 3 pills a day, 1/2 in the morning, 1/2 at lunch and 2 at bedtime.  Pt is requesting 90 pills a month.  Pt states Dr Venia Minks had approved this/MJ

## 2014-08-31 ENCOUNTER — Telehealth: Payer: Self-pay | Admitting: Physician Assistant

## 2014-08-31 DIAGNOSIS — J4541 Moderate persistent asthma with (acute) exacerbation: Secondary | ICD-10-CM

## 2014-08-31 MED ORDER — HYDROCODONE-HOMATROPINE 5-1.5 MG/5ML PO SYRP: 5.0000 mL | ORAL_SOLUTION | Freq: Three times a day (TID) | ORAL | Status: DC | PRN

## 2014-08-31 NOTE — Telephone Encounter (Signed)
Left patient a voicemail informing her that the RX is at the front desk ready for pick up.

## 2014-08-31 NOTE — Telephone Encounter (Signed)
Pt contacted office for refill request on the following medications:  HYDROcodone-homatropine (HYCODAN) 5-1.5 MG/5ML syrup.  [facility] [place].  HF#[phone]/CE

## 2014-08-31 NOTE — Telephone Encounter (Signed)
Rx printed and at front desk for pick up.  Thanks! -JB

## 2014-09-03 ENCOUNTER — Encounter: Payer: Self-pay | Admitting: Internal Medicine

## 2014-09-03 ENCOUNTER — Ambulatory Visit (INDEPENDENT_AMBULATORY_CARE_PROVIDER_SITE_OTHER): Payer: Medicare Other | Admitting: Internal Medicine

## 2014-09-03 VITALS — BP 118/88 | HR 63 | Temp 97.8°F | Ht 64.0 in | Wt 149.0 lb

## 2014-09-03 DIAGNOSIS — J45998 Other asthma: Secondary | ICD-10-CM

## 2014-09-03 DIAGNOSIS — J4551 Severe persistent asthma with (acute) exacerbation: Secondary | ICD-10-CM

## 2014-09-03 DIAGNOSIS — R0609 Other forms of dyspnea: Secondary | ICD-10-CM

## 2014-09-03 DIAGNOSIS — J45909 Unspecified asthma, uncomplicated: Secondary | ICD-10-CM

## 2014-09-03 MED ORDER — PREDNISONE 20 MG PO TABS: 20.0000 mg | ORAL_TABLET | Freq: Every day | ORAL | Status: DC

## 2014-09-03 MED ORDER — FLUTICASONE-SALMETEROL 500-50 MCG/DOSE IN AEPB: 1.0000 | INHALATION_SPRAY | Freq: Two times a day (BID) | RESPIRATORY_TRACT | Status: DC

## 2014-09-03 NOTE — Patient Instructions (Signed)
Follow up with Dr. Stevenson Clinch in 6 weeks - pulmonary function testing and 6 minute walk test prior to follow up - CT chest and neck without contrast, secondary to recurrent infections\bronchitis - cont with allergy control (singulair, claritin, flonase) - cont with omeprazole twice a day - will discuss sleep apnea at your next visit - will obtain a copy of your last sleep study form [facility] two year ago - to optimize your asthma - we will start Advair 500/50 (1puff twice a day, gargle and rinse after each use) - maintenance inhaler.  - Prednisone 20mg  x 5 days.  - albuterol RESCUE inhaler - 2puff every 3-4 hours as needed for shortness of breath\wheezing\recurrent cough

## 2014-09-03 NOTE — Assessment & Plan Note (Addendum)
See plan for asthma

## 2014-09-03 NOTE — Assessment & Plan Note (Addendum)
Patient's asthma at this time is uncontrolled, she is more of a moderate persistent to severe persistent classification. Since her initial illness in February she's not been able to rebound back to her baseline breathing, review of her chest x-ray does show she has a left vertical scar midline along the lungs, this could be from previous infection years ago, and can also serve as a nidus for recurrent infection. Given that she still having recurrent symptoms and uncontrolled asthma, will risk of having atypical infection is high. Will further evaluate with CT of chest without contrast, since she has a contrast allergy. Is also concern for microaspiration since her neck surgery in 2015, will further evaluate with CT neck.  Plan: -CT neck and chest without contrast to further workup intrinsic lung or anatomical lung disease along with neck issues. -Start Advair 500/50, 1 puff in the morning and 1 puff in the evening, gargle and rinse after each use -We will obtain a study of previous split-night study [facility] -Concern for all microaspiration/silent reflux; a CT neck and chest are negative and patient continues to be symptomatic may need to further evaluate with barium swallow. -Given that the patient is still having significant dyspnea on exertion with recurrent use of rescue inhaler, and uncontrolled asthma, will treat with another dose of prednisone, 20 mg 5 days.

## 2014-09-03 NOTE — Progress Notes (Signed)
Date: 09/03/2014  MRN# [id number] [name] 1960-06-15  Referring Physician:   SOKHNA Cooley is a 54 y.o. old female seen in consultation for cough, possible asthma.  CC:  Chief Complaint  Patient presents with  . Advice Only    Referral for Reactive airway disease Dr. Lelon Huh:    HPI:  Patient is a pleasant 54 year old female presents today for follow-up of chronic cough and asthma since February 2016. Patient is accompanied by her female friend. Events as follows. In February 2016 she had flulike symptoms with cough, shortness of breath, productive sputum, this went on for about 2 weeks and spontaneously resolved. In March 2016 she had a relapse, follow-up with her primary care physician and was started on anti-biotics and steroids for about 7 days (she was given doxycycline). In April 2016 patient stated she had intense shortness of breath and chest tightness and went to the ED for further evaluation, there she was given nebulizers 2, prednisone and diagnosed with asthma exacerbation, at that time she also had a productive cough. In June 2016 patient had a relapse of her upper respiratory tract/flulike symptoms and was given Levaquin, she also had a cough at that time. Patient states she's had asthma since the age of 39, unknown trigger is, and has had "bronchitis" at least 2 times a year since childhood. Since February 2015 when she had her neck surgery she's had a feeling of "something stuck in my throat". Patient still endorses chest tightness tenths worse at night and especially when laying flat, she also endorses positive snoring; patient states she has sleep study 2 years ago and was told she did not have sleep apnea. Her current meds asthma are Advair 250/50, poor air (which she is using about 3 times per day) and allergy control (Flonase, Singulair, Claritin). Patient also endorses dyspnea on exertion, states she can walk about 20-25 yards before getting severely short  of breath. She states she is a never smoker, she has had significant secondhand smoke exposure from appearance, she has 2 dogs and 1 cat at home. Patient's had significant surgical history, right hip replacement without any complications, Ross placed in her lumbar and thoracic spine, neck surgery in 2015, patient has severe degenerative disc disease requiring the previous listed surgeries.   PMHX:   Past Medical History  Diagnosis Date  . Headache(784.0)     AND NECK PAIN--STATES RECENT TEST SHOW CERVICAL DEGENERATION  . PONV (postoperative nausea and vomiting)     PT GIVES HX OF N&V AND FEVER WITH SURGERIES YEARS AGO--BUT NO PROBLEMS WITH MORE RECENT SURGERIES--STATES NOT MALIGNANT HYPERTHERMIA  . 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 Hypertension   . Asthma     INHALERS WITH FLARE -UPS  . GERD (gastroesophageal reflux disease)   . DDD (degenerative disc disease)     CERVICAL AND LUMBAR-CHRONIC PAIN, RT HIP LABRAL TEAR  . Pain     CHRONIC NECK AND BACK PAIN - LIMITED ROM NECK - S/P FUSIONS CERVICAL AND LUMBAR  . Anemia     Iron Infusion-8 yrs ago  . History of blood transfusion     s/p back surgery  . Inverted T wave   . Pneumonia   . History of kidney stones   . Right foot drop   . Anxiety   . Depression     PT STATES A LOT OF STRESS  IN HER LIFE   Surgical Hx:  Past Surgical History  Procedure Laterality Date  . Right hip arthroscopy for labral tear  ABOUT 2010    2012 also  . Anterior fusion cervical spine  MAY 2012    AT [facility]  . Nasal septum surgery  MARCH 2013    IN [place]  . Back surgery  2009    LUMBAR FUSION WITH RODS   . Tonsillectomy      AS A CHILD  . Abdominal hysterectomy    . Diagnostic laparoscopies - multiple for endometriosis    . Cholecystectomy    . Hip arthroscopy  09/20/2011    Procedure: ARTHROSCOPY HIP;  Surgeon: Shelby Alf, MD;  Location: [facility];   Service: Orthopedics;  Laterality: Right;  Right Hip Scope with Labral Debridement  . Shoulder arthroscopy  05-06-12    bone spur  . Carpal tunnel release  05-06-12    Right  . Total hip arthroplasty Right 05/13/2012    Procedure: TOTAL HIP ARTHROPLASTY ANTERIOR APPROACH;  Surgeon: Shelby Alf, MD;  Location: [facility];  Service: Orthopedics;  Laterality: Right;  . Anterior cervical decomp/discectomy fusion N/A 10/30/2012    Procedure: ACDF C5-6, EXPLORATION AND HARDWARE REMOVAL C6-7;  Surgeon: Shelby Schools, MD;  Location: [facility];  Service: Orthopedics;  Laterality: N/A;  . Posterior cervical fusion/foraminotomy N/A 04/16/2013    Procedure: REMOVAL CERVICAL PLATES AND INTERBODY CAGE/POSTERIOR CERVICAL SPINAL FUSION C4 - C6/C5 CORPECTOMY/C4 - C6 FUSION WITH ILIAC CREST BONE GRAFT;  Surgeon: Shelby Schools, MD;  Location: [facility];  Service: Orthopedics;  Laterality: N/A;   Family Hx:  Family History  Problem Relation Age of Onset  . Aneurysm Mother   . Aneurysm Maternal Grandmother   . Breast cancer Paternal Grandmother    Social Hx:   History  Substance Use Topics  . Smoking status: Never Smoker   . Smokeless tobacco: Never Used  . Alcohol Use: No   Medication:   Current Outpatient Rx  Name  Route  Sig  Dispense  Refill  . ADVAIR DISKUS 250-50 MCG/DOSE AEPB                 Dispense as written.   Marland Kitchen albuterol (PROVENTIL HFA;VENTOLIN HFA) 108 (90 BASE) MCG/ACT inhaler   Inhalation   Inhale 2 puffs into the lungs every 6 (six) hours as needed for wheezing.         Marland Kitchen ALPRAZolam (XANAX) 1 MG tablet      1/2 tab in a.m., 1/2 tab at lunch, 2 tabs at bedtime   90 tablet   0   . buPROPion (WELLBUTRIN SR) 150 MG 12 hr tablet   Oral   Take 150 mg by mouth 2 (two) times daily.         . [facility] D3 2000 UNITS CAPS   Oral   Take 1 capsule by mouth daily.      0     Dispense as written.   . fluticasone (FLONASE) 50 MCG/ACT nasal spray   Nasal   Place 2 sprays into the nose daily.          Marland Kitchen HYDROcodone-homatropine (HYCODAN) 5-1.5 MG/5ML syrup   Oral   Take 5 mLs by mouth every 8 (eight) hours as needed for cough.   120 mL   0   . levofloxacin (LEVAQUIN) 500 MG tablet   Oral   Take 1 tablet (500 mg total) by mouth daily.   7 tablet  0   . Linaclotide (LINZESS) 290 MCG CAPS capsule   Oral   Take 290 mcg by mouth daily.         Marland Kitchen loratadine (CLARITIN REDITABS) 10 MG dissolvable tablet   Oral   Take 1 tablet by mouth daily.         . methocarbamol (ROBAXIN) 500 MG tablet   Oral   Take 1 tablet (500 mg total) by mouth every 6 (six) hours as needed for muscle spasms.   60 tablet   0   . metoprolol succinate (TOPROL-XL) 50 MG 24 hr tablet   Oral   Take 50 mg by mouth daily.         . metoprolol succinate (TOPROL-XL) 50 MG 24 hr tablet   Oral   Take by mouth.         . montelukast (SINGULAIR) 10 MG tablet   Oral   Take 1 tablet by mouth daily.         Marland Kitchen omeprazole (PRILOSEC) 20 MG capsule   Oral   Take 1 capsule by mouth daily.         . ondansetron (ZOFRAN-ODT) 4 MG disintegrating tablet   Oral   Take 1 tablet (4 mg total) by mouth every 8 (eight) hours as needed for nausea or vomiting.   60 tablet   0   . OxyCODONE (OXYCONTIN) 10 mg T12A 12 hr tablet   Oral   Take 10 mg by mouth.         . polyethylene glycol (MIRALAX / GLYCOLAX) packet   Oral   Take 17 g by mouth daily.   30 each   0   . Spacer/Aero-Holding Chambers (AEROCHAMBER MV) inhaler   Does not apply   by Does not apply route.         . predniSONE (DELTASONE) 20 MG tablet   Oral   Take 1 tablet (20 mg total) by mouth daily.   5 tablet   0       Allergies:  Amoxicillin; Clindamycin; Codeine; Sulfa antibiotics; Shellfish allergy; Decadron; Papaya derivatives; Prednisone; Betadine; Chlorhexidine; Clarithromycin; Clindamycin/lincomycin; and Other  Review of Systems: Gen:  Denies  fever, sweats, chills HEENT: Denies blurred vision, double vision, ear  pain, eye pain, hearing loss, nose bleeds, sore throat Cvc:  No dizziness, chest pain or heaviness Resp:   Admits to productive cough, dyspnea on exertion, intermittent chest tightness, intermittent wheeze Gi: Denies swallowing difficulty, stomach pain, nausea or vomiting, diarrhea, constipation, bowel incontinence Gu:  Denies bladder incontinence, burning urine Ext:   No Joint pain, stiffness or swelling Skin: No skin rash, easy bruising or bleeding or hives Endoc:  No polyuria, polydipsia , polyphagia or weight change Psych: No depression, insomnia or hallucinations  Other:  All other systems negative  Physical Examination:   VS: BP 118/88 mmHg  Pulse 63  Temp(Src) 97.8 F (36.6 C) (Oral)  Ht 5\' 4"  (1.626 m)  Wt 149 lb (67.586 kg)  BMI 25.56 kg/m2  SpO2 97%  General Appearance: No distress  Neuro:without focal findings, mental status, speech normal, alert and oriented, cranial nerves 2-12 intact, reflexes normal and symmetric, sensation grossly normal  HEENT: PERRLA, EOM intact, no ptosis, no other lesions noticed; Mallampati 3 Pulmonary: normal breath sounds., diaphragmatic excursion normal.No wheezing, No rales;   Sputum Production:  none CardiovascularNormal S1,S2.  No m/r/g.  Abdominal aorta pulsation normal.    Abdomen: Benign, Soft, non-tender, No masses, hepatosplenomegaly, No lymphadenopathy Renal:  No costovertebral  tenderness  GU:  No performed at this time. Endoc: No evident thyromegaly, no signs of acromegaly or Cushing features Skin:   warm, no rashes, no ecchymosis  Extremities: normal, no cyanosis, clubbing, no edema, warm with normal capillary refill. Other findings:   Labs results:   Rad results: (The following images and results were reviewed by Dr. Stevenson Clinch). Chest x-ray March 2016 Findings: There is mild scarring in the left base. Elsewhere lungs are clear. Heart size and pulmonary vascularity are normal. No adenopathy. No pneumothorax. There is postoperative  changes in the lower cervical spine as well as in the lower thoracic and visualized upper lumbar spine. Impression: Scarring left base. No edema    Assessment and Plan: 54 year old female past medical history of asthma, hypertension, seen in consultation for asthma optimization Asthma, mild Patient's asthma at this time is uncontrolled, she is more of a moderate persistent to severe persistent classification. Since her initial illness in February she's not been able to rebound back to her baseline breathing, review of her chest x-ray does show she has a left vertical scar midline along the lungs, this could be from previous infection years ago, and can also serve as a nidus for recurrent infection. Given that she still having recurrent symptoms and uncontrolled asthma, will risk of having atypical infection is high. Will further evaluate with CT of chest without contrast, since she has a contrast allergy. Is also concern for microaspiration since her neck surgery in 2015, will further evaluate with CT neck.  Plan: -CT neck and chest without contrast to further workup intrinsic lung or anatomical lung disease along with neck issues. -Start Advair 500/50, 1 puff in the morning and 1 puff in the evening, gargle and rinse after each use -We will obtain a study of previous split-night study done 2 years ago at sleep [facility] -Concern for all microaspiration/silent reflux; a CT neck and chest are negative and patient continues to be symptomatic may need to further evaluate with barium swallow. -Given that the patient is still having significant dyspnea on exertion with recurrent use of rescue inhaler, and uncontrolled asthma, will treat with another dose of prednisone, 20 mg 5 days.  RAD (reactive airway disease) See plan for asthma     Updated Medication List Outpatient Encounter Prescriptions as of 09/03/2014  Medication Sig  . ADVAIR DISKUS 250-50 MCG/DOSE AEPB   . albuterol (PROVENTIL  HFA;VENTOLIN HFA) 108 (90 BASE) MCG/ACT inhaler Inhale 2 puffs into the lungs every 6 (six) hours as needed for wheezing.  Marland Kitchen albuterol (PROVENTIL HFA;VENTOLIN HFA) 108 (90 BASE) MCG/ACT inhaler Inhale into the lungs.  . ALPRAZolam (XANAX) 1 MG tablet 1/2 tab in a.m., 1/2 tab at lunch, 2 tabs at bedtime  . buPROPion (WELLBUTRIN SR) 150 MG 12 hr tablet Take 150 mg by mouth 2 (two) times daily.  Marland Kitchen buPROPion (WELLBUTRIN) 100 MG tablet Take by mouth.  . [facility]S D3 2000 UNITS CAPS Take 1 capsule by mouth daily.  Marland Kitchen docusate sodium (COLACE) 100 MG capsule Take 1 capsule (100 mg total) by mouth 2 (two) times daily. (Patient not taking: Reported on 08/20/2014)  . fluticasone (FLONASE) 50 MCG/ACT nasal spray Place 2 sprays into the nose daily.  Marland Kitchen HYDROcodone-homatropine (HYCODAN) 5-1.5 MG/5ML syrup Take 5 mLs by mouth every 8 (eight) hours as needed for cough.  Marland Kitchen levofloxacin (LEVAQUIN) 500 MG tablet Take 1 tablet (500 mg total) by mouth daily.  . Linaclotide (LINZESS) 290 MCG CAPS capsule Take 290 mcg by mouth daily.  Marland Kitchen  loratadine (CLARITIN REDITABS) 10 MG dissolvable tablet Take 1 tablet by mouth daily.  . methocarbamol (ROBAXIN) 500 MG tablet Take 1 tablet (500 mg total) by mouth every 6 (six) hours as needed for muscle spasms. (Patient not taking: Reported on 08/20/2014)  . metoprolol succinate (TOPROL-XL) 50 MG 24 hr tablet Take 50 mg by mouth daily.  . metoprolol succinate (TOPROL-XL) 50 MG 24 hr tablet Take by mouth.  . montelukast (SINGULAIR) 10 MG tablet Take 1 tablet by mouth daily.  Marland Kitchen omeprazole (PRILOSEC) 20 MG capsule Take 1 capsule by mouth daily.  . ondansetron (ZOFRAN-ODT) 4 MG disintegrating tablet Take 1 tablet (4 mg total) by mouth every 8 (eight) hours as needed for nausea or vomiting.  Marland Kitchen oxyCODONE (OXY IR/ROXICODONE) 5 MG immediate release tablet One tablet by mouth every 2-3 hours for breakthrough pain (Patient not taking: Reported on 08/20/2014)  . OxyCODONE (OXYCONTIN) 10 mg T12A 12 hr tablet  Take 10 mg by mouth.  . oxyCODONE-acetaminophen (PERCOCET) 10-325 MG per tablet Take 1 tablet by mouth every 4 (four) hours as needed for pain. (Patient not taking: Reported on 08/20/2014)  . polyethylene glycol (MIRALAX / GLYCOLAX) packet Take 17 g by mouth daily. (Patient not taking: Reported on 08/20/2014)  . sertraline (ZOLOFT) 50 MG tablet Take 2 tablets by mouth daily.  Marland Kitchen Spacer/Aero-Holding Chambers (AEROCHAMBER MV) inhaler by Does not apply route.  . Vitamin D, Ergocalciferol, (DRISDOL) 50000 UNITS CAPS capsule TAKE ONE CAPSULE BY MOUTH TWICE WEEKLY   No facility-administered encounter medications on file as of 09/03/2014.    Orders for this visit: No orders of the defined types were placed in this encounter.     Thank  you for the consultation and for allowing [facility] to assist in the care of your patient. Our recommendations are noted above.  Please contact us if we can be of further service.   Vilinda Boehringer, MD [facility] Office Number: [phone]

## 2014-09-07 ENCOUNTER — Encounter: Payer: Self-pay | Admitting: Family Medicine

## 2014-09-15 ENCOUNTER — Telehealth: Payer: Self-pay | Admitting: Internal Medicine

## 2014-09-15 MED ORDER — FLUTICASONE-SALMETEROL 500-50 MCG/DOSE IN AEPB: 1.0000 | INHALATION_SPRAY | Freq: Two times a day (BID) | RESPIRATORY_TRACT | Status: DC

## 2014-09-15 NOTE — Telephone Encounter (Signed)
Spoke with pt. States she needs a refill of Advair 500. This has been taken care of. Nothing further was needed.

## 2014-09-16 ENCOUNTER — Telehealth: Payer: Self-pay | Admitting: *Deleted

## 2014-09-16 NOTE — Telephone Encounter (Signed)
-----   Message from Vilinda Boehringer, MD sent at 09/10/2014  6:03 PM EDT ----- Regarding: CT authorization CT Neck authorization# [id number]  Please let Suanne Marker know   Thanks

## 2014-09-24 ENCOUNTER — Other Ambulatory Visit: Payer: Self-pay | Admitting: Family Medicine

## 2014-09-24 DIAGNOSIS — F411 Generalized anxiety disorder: Secondary | ICD-10-CM

## 2014-09-24 MED ORDER — ALPRAZOLAM 1 MG PO TABS: ORAL_TABLET | ORAL | Status: DC

## 2014-09-24 NOTE — Telephone Encounter (Signed)
Pt contacted office for refill request on the following medications:  ALPRAZolam (XANAX) 1 MG.  [facility] [place].  OL#[phone]/OO

## 2014-09-24 NOTE — Telephone Encounter (Signed)
Ok to call in rx.  Thanks.  

## 2014-09-25 ENCOUNTER — Telehealth: Payer: Self-pay | Admitting: Family Medicine

## 2014-09-28 ENCOUNTER — Telehealth: Payer: Self-pay | Admitting: Family Medicine

## 2014-09-28 ENCOUNTER — Other Ambulatory Visit: Payer: Self-pay | Admitting: Physician Assistant

## 2014-09-28 DIAGNOSIS — F419 Anxiety disorder, unspecified: Secondary | ICD-10-CM

## 2014-09-28 NOTE — Telephone Encounter (Signed)
ALPRAZolam Duanne Moron) 1 MG tablet 09/24/14 -- Margarita Rana, MD 1/2 tab in a.m., 1/2 tab at lunch, 2 tabs   Pha

## 2014-10-01 NOTE — Telephone Encounter (Signed)
I don't know if anyone ever called patient, I just happened across the Rx.  Can we call patient and let her know that Rx is at front desk.  Thanks! -JB

## 2014-10-02 NOTE — Telephone Encounter (Signed)
Pt advised-aa 

## 2014-10-06 ENCOUNTER — Ambulatory Visit: Admission: RE | Admit: 2014-10-06 | Payer: Medicare Other | Source: Ambulatory Visit

## 2014-10-06 ENCOUNTER — Ambulatory Visit: Payer: Medicare Other

## 2014-11-11 ENCOUNTER — Other Ambulatory Visit: Payer: Self-pay | Admitting: Family Medicine

## 2014-11-11 DIAGNOSIS — F419 Anxiety disorder, unspecified: Secondary | ICD-10-CM

## 2014-11-11 MED ORDER — ALPRAZOLAM 1 MG PO TABS: ORAL_TABLET | ORAL | Status: DC

## 2014-11-11 NOTE — Telephone Encounter (Signed)
Patient is requesting a refill on her ALPRAZolam (XANAX) 1 MG tablet Please call if you have any questions. [phone]

## 2014-11-11 NOTE — Telephone Encounter (Signed)
Patient's LOV was 08/20/14. Xanax was last filled on 09/28/14 #90 with RFx1.

## 2014-11-11 NOTE — Telephone Encounter (Signed)
Ok to call in rx. And clarify that patient did not pick up another rx recently. Thanks.

## 2014-11-12 NOTE — Telephone Encounter (Signed)
Ok to let patient know still has refill. Thanks.

## 2014-11-12 NOTE — Telephone Encounter (Signed)
[facility], they report that pt took in a prescription on 10/12/2014 with one refill, prescribed by Sonia Baller. I did not call this new prescription in. Please advise. sd

## 2014-11-30 ENCOUNTER — Encounter: Payer: Medicare Other | Admitting: Physician Assistant

## 2014-12-02 ENCOUNTER — Other Ambulatory Visit: Payer: Self-pay | Admitting: Family Medicine

## 2014-12-02 DIAGNOSIS — J45909 Unspecified asthma, uncomplicated: Secondary | ICD-10-CM

## 2014-12-02 DIAGNOSIS — K2100 Gastro-esophageal reflux disease with esophagitis, without bleeding: Secondary | ICD-10-CM

## 2014-12-10 ENCOUNTER — Other Ambulatory Visit: Payer: Self-pay | Admitting: Physician Assistant

## 2014-12-10 DIAGNOSIS — F411 Generalized anxiety disorder: Secondary | ICD-10-CM

## 2014-12-14 ENCOUNTER — Other Ambulatory Visit: Payer: Self-pay | Admitting: Family Medicine

## 2014-12-14 DIAGNOSIS — F411 Generalized anxiety disorder: Secondary | ICD-10-CM

## 2014-12-14 MED ORDER — ALPRAZOLAM 1 MG PO TABS: ORAL_TABLET | ORAL | Status: DC

## 2014-12-14 NOTE — Telephone Encounter (Signed)
Last OV 06/2014  Thanks,   -Laura  

## 2014-12-14 NOTE — Telephone Encounter (Signed)
Pt stated that she has an RX for ALPRAZolam Duanne Moron) 1 MG tablet for her to pick it up but would like it called in instead. Thanks TNP

## 2014-12-14 NOTE — Telephone Encounter (Signed)
Ok to call in or fax rx.  Thanks.

## 2014-12-15 ENCOUNTER — Encounter: Payer: Self-pay | Admitting: Family Medicine

## 2014-12-15 ENCOUNTER — Ambulatory Visit (INDEPENDENT_AMBULATORY_CARE_PROVIDER_SITE_OTHER): Payer: Medicare Other | Admitting: Family Medicine

## 2014-12-15 ENCOUNTER — Ambulatory Visit
Admission: RE | Admit: 2014-12-15 | Discharge: 2014-12-15 | Disposition: A | Discharge status: Home / Self Care | Payer: Medicare Other | Source: Ambulatory Visit | Attending: Family Medicine | Admitting: Family Medicine

## 2014-12-15 VITALS — BP 120/96 | HR 58 | Temp 97.8°F | Resp 16 | Ht 64.0 in | Wt 151.4 lb

## 2014-12-15 DIAGNOSIS — R109 Unspecified abdominal pain: Secondary | ICD-10-CM | POA: Diagnosis not present

## 2014-12-15 DIAGNOSIS — R1084 Generalized abdominal pain: Secondary | ICD-10-CM | POA: Diagnosis not present

## 2014-12-15 DIAGNOSIS — G4485 Primary stabbing headache: Secondary | ICD-10-CM

## 2014-12-15 DIAGNOSIS — R112 Nausea with vomiting, unspecified: Secondary | ICD-10-CM

## 2014-12-15 DIAGNOSIS — K560 Paralytic ileus: Secondary | ICD-10-CM | POA: Diagnosis not present

## 2014-12-15 MED ORDER — ONDANSETRON 8 MG PO TBDP: 8.0000 mg | ORAL_TABLET | Freq: Three times a day (TID) | ORAL | Status: DC | PRN

## 2014-12-15 MED ORDER — KETOROLAC TROMETHAMINE 60 MG/2ML IM SOLN
60.0000 mg | Freq: Once | INTRAMUSCULAR | Status: AC
  Administered 2014-12-15: 60 mg via INTRAMUSCULAR

## 2014-12-15 NOTE — Progress Notes (Signed)
Subjective:     Patient ID: [name], female   DOB: 17-May-1960, 54 y.o.   MRN: [id number]  HPI  Chief Complaint  Patient presents with  . Emesis    Patient comes in office today with concerns of vomiting over the period of 4 days. Patient describes vomit as undigested food and now patient states that it is bile, patient has a history of diverticulitis and believes that is what she has today. Associates symptoms include: chills/sweats, abdominal pain and swelling and headache.   Reports intermittent abdominal pain and swelling for several weeks but now is accompanied by nausea, vomiting, loose yellow stools, headache, and right lateral hip pain. Has been keeping up with fluids with Ginger Ale. But has vomited up food. Hx of IBS-C, diverticulosis, cholecystectomy and hysterectomy.   Review of Systems  Neurological:       Headache is described as sharp pain unlike migraines she has had in childhood. Has been taking ibuprofen and Tylenol for her sx.       Objective:   Physical Exam  Constitutional: She appears well-developed and well-nourished. No distress.  Pulmonary/Chest: Breath sounds normal.  Abdominal: There is no guarding.  Diffuse moderate tenderness with hyperactive bowel sounds. I don't see any overt peristaltic waves.  Musculoskeletal:  SLR on right provokes pain in lateral hip and lower back.       Assessment:    1. Primary stabbing headache - ketorolac (TORADOL) injection 60 mg; Inject 2 mLs (60 mg total) into the muscle once.  2. Generalized abdominal pain: ? Obstruction/gastroenteritis - DG Abd 2 Views; Future - CBC with Differential/Platelet - Comprehensive metabolic panel - Lipase  3. Non-intractable vomiting with nausea, vomiting of unspecified type - DG Abd 2 Views; Future - ondansetron (ZOFRAN ODT) 8 MG disintegrating tablet; Take 1 tablet (8 mg total) by mouth every 8 (eight) hours as needed for nausea or vomiting.  Dispense: 20 tablet; Refill: 0     Plan:    Further f/u pending x-ray and lab work.

## 2014-12-15 NOTE — Patient Instructions (Signed)
We will call you with x-ray and lab report.

## 2014-12-16 ENCOUNTER — Telehealth: Payer: Self-pay

## 2014-12-16 LAB — COMPREHENSIVE METABOLIC PANEL
A/G RATIO: 1.5 (ref 1.1–2.5)
ALK PHOS: 122 IU/L — AB (ref 39–117)
ALT: 8 IU/L (ref 0–32)
AST: 14 IU/L (ref 0–40)
Albumin: 4.3 g/dL (ref 3.5–5.5)
BUN / CREAT RATIO: 10 (ref 9–23)
BUN: 8 mg/dL (ref 6–24)
Bilirubin Total: 0.5 mg/dL (ref 0.0–1.2)
CO2: 23 mmol/L (ref 18–29)
Calcium: 9.8 mg/dL (ref 8.7–10.2)
Chloride: 100 mmol/L (ref 97–108)
Creatinine, Ser: 0.77 mg/dL (ref 0.57–1.00)
GFR calc Af Amer: 101 mL/min/{1.73_m2} (ref >59)
GFR calc non Af Amer: 87 mL/min/{1.73_m2} (ref >59)
GLOBULIN, TOTAL: 2.8 g/dL (ref 1.5–4.5)
Glucose: 94 mg/dL (ref 65–99)
POTASSIUM: 4.4 mmol/L (ref 3.5–5.2)
SODIUM: 142 mmol/L (ref 134–144)
Total Protein: 7.1 g/dL (ref 6.0–8.5)

## 2014-12-16 LAB — CBC WITH DIFFERENTIAL/PLATELET

## 2014-12-16 LAB — LIPASE: LIPASE: 24 U/L (ref 0–59)

## 2014-12-16 NOTE — Telephone Encounter (Signed)
Patient was advised as below, she wanted to know if you can have her thyroid checked as well due to her history of hypothyroidism. KW

## 2014-12-16 NOTE — Telephone Encounter (Signed)
-----   Message from Carmon Ginsberg, [place] sent at 12/16/2014  9:54 AM EDT ----- [facility] was unable to perform your blood count (CBC) and will call you about a redraw. The remainder of your labs look ok.

## 2014-12-17 ENCOUNTER — Other Ambulatory Visit: Payer: Self-pay | Admitting: Family Medicine

## 2014-12-17 DIAGNOSIS — Z8639 Personal history of other endocrine, nutritional and metabolic disease: Secondary | ICD-10-CM

## 2014-12-17 NOTE — Telephone Encounter (Signed)
Pt is retuning call.  BF#[phone]/MA

## 2014-12-17 NOTE — Telephone Encounter (Signed)
Lab slip for thyroid tests up front for pickup

## 2014-12-17 NOTE — Telephone Encounter (Signed)
Left message informing patient that lab slip is available for pick up. Kws

## 2014-12-17 NOTE — Telephone Encounter (Signed)
[id number] 

## 2015-01-04 ENCOUNTER — Encounter: Payer: Self-pay | Admitting: Pain Medicine

## 2015-01-04 ENCOUNTER — Ambulatory Visit: Payer: Medicare Other | Attending: Pain Medicine | Admitting: Pain Medicine

## 2015-01-04 VITALS — BP 157/103 | HR 59 | Temp 98.0°F | Resp 18 | Ht 64.0 in | Wt 150.0 lb

## 2015-01-04 DIAGNOSIS — M961 Postlaminectomy syndrome, not elsewhere classified: Secondary | ICD-10-CM

## 2015-01-04 DIAGNOSIS — G8929 Other chronic pain: Secondary | ICD-10-CM | POA: Diagnosis not present

## 2015-01-04 DIAGNOSIS — G96198 Other disorders of meninges, not elsewhere classified: Secondary | ICD-10-CM

## 2015-01-04 DIAGNOSIS — M25512 Pain in left shoulder: Secondary | ICD-10-CM

## 2015-01-04 DIAGNOSIS — M1288 Other specific arthropathies, not elsewhere classified, other specified site: Secondary | ICD-10-CM

## 2015-01-04 DIAGNOSIS — M533 Sacrococcygeal disorders, not elsewhere classified: Secondary | ICD-10-CM | POA: Insufficient documentation

## 2015-01-04 DIAGNOSIS — M25559 Pain in unspecified hip: Secondary | ICD-10-CM | POA: Insufficient documentation

## 2015-01-04 DIAGNOSIS — F1120 Opioid dependence, uncomplicated: Secondary | ICD-10-CM

## 2015-01-04 DIAGNOSIS — M549 Dorsalgia, unspecified: Secondary | ICD-10-CM | POA: Insufficient documentation

## 2015-01-04 DIAGNOSIS — M47816 Spondylosis without myelopathy or radiculopathy, lumbar region: Secondary | ICD-10-CM | POA: Insufficient documentation

## 2015-01-04 DIAGNOSIS — Z9889 Other specified postprocedural states: Secondary | ICD-10-CM

## 2015-01-04 DIAGNOSIS — Z79891 Long term (current) use of opiate analgesic: Secondary | ICD-10-CM

## 2015-01-04 DIAGNOSIS — G894 Chronic pain syndrome: Secondary | ICD-10-CM

## 2015-01-04 DIAGNOSIS — M542 Cervicalgia: Secondary | ICD-10-CM

## 2015-01-04 DIAGNOSIS — Z5181 Encounter for therapeutic drug level monitoring: Secondary | ICD-10-CM

## 2015-01-04 DIAGNOSIS — M47812 Spondylosis without myelopathy or radiculopathy, cervical region: Secondary | ICD-10-CM

## 2015-01-04 DIAGNOSIS — Z79899 Other long term (current) drug therapy: Secondary | ICD-10-CM

## 2015-01-04 DIAGNOSIS — Z889 Allergy status to unspecified drugs, medicaments and biological substances status: Secondary | ICD-10-CM

## 2015-01-04 DIAGNOSIS — G9619 Other disorders of meninges, not elsewhere classified: Secondary | ICD-10-CM

## 2015-01-04 DIAGNOSIS — Z789 Other specified health status: Secondary | ICD-10-CM | POA: Insufficient documentation

## 2015-01-04 DIAGNOSIS — M47892 Other spondylosis, cervical region: Secondary | ICD-10-CM | POA: Insufficient documentation

## 2015-01-04 DIAGNOSIS — M25519 Pain in unspecified shoulder: Secondary | ICD-10-CM | POA: Diagnosis not present

## 2015-01-04 DIAGNOSIS — M539 Dorsopathy, unspecified: Secondary | ICD-10-CM

## 2015-01-04 DIAGNOSIS — M25511 Pain in right shoulder: Secondary | ICD-10-CM

## 2015-01-04 DIAGNOSIS — M5382 Other specified dorsopathies, cervical region: Secondary | ICD-10-CM

## 2015-01-04 DIAGNOSIS — F1190 Opioid use, unspecified, uncomplicated: Secondary | ICD-10-CM

## 2015-01-04 DIAGNOSIS — M25551 Pain in right hip: Secondary | ICD-10-CM

## 2015-01-04 HISTORY — DX: Other specified postprocedural states: Z98.890

## 2015-01-04 MED ORDER — OXYCODONE HCL 10 MG PO TABS
10.0000 mg | ORAL_TABLET | Freq: Four times a day (QID) | ORAL | Status: DC | PRN
Start: 2015-01-04 — End: 2015-03-03

## 2015-01-04 MED ORDER — OXYCODONE HCL 10 MG PO TABS: 10.0000 mg | ORAL_TABLET | Freq: Four times a day (QID) | ORAL | Status: DC | PRN

## 2015-01-04 NOTE — Progress Notes (Signed)
Patient's Name: [name] MRN: [id number] DOB: 1960/11/02 DOS: 01/04/2015  Primary Reason(s) for Visit: Encounter for Medication Management. CC: Shoulder Pain; Hip Pain; and Back Pain   HPI:   [name] is a 54 y.o. year old, female patient, who returns today as an established patient. She has Right hip pain; Right hip osteoarthritis; Postoperative anemia due to acute blood loss; Postop Hyponatremia; Chronic neck pain; Orthostatic hypotension; Dizziness; Anxiety state; Asthma, mild; Blurred vision; Benign paroxysmal positional nystagmus; Cervical disc disease; Congenital renal agenesis and dysgenesis; Narrowing of intervertebral disc space; Clinical depression; Colon, diverticulosis; Duodenal ulcer with hemorrhage and perforation ([place]); Fatigue; Acid reflux; Cephalalgia; Right hip arthralgia; H/O arthrodesis; Cannot sleep; Adaptive colitis; Lumbar disc disorder with myelopathy; Awareness of heartbeats; Addison anemia; RAD (reactive airway disease); Apnea, sleep; Esophagitis, reflux; DOE (dyspnea on exertion); Chronic left shoulder pain; Chronic right hip pain; Encounter for therapeutic drug level monitoring; Long term current use of opiate analgesic; Long term prescription opiate use; Uncomplicated opioid dependence ([place]); Opiate use; Chronic pain syndrome; Chronic pain; Steroid intolerance; Opioid dependence ([place]); Cervical spine pain; Cervical spondylosis; Cervical facet arthropathy (bilateral); History of cervical spinal surgery; Chronic pain of both shoulders; Failed back surgical syndrome; Epidural fibrosis; Lumbar spondylosis; Cervical facet syndrome (bilateral); and Chronic right sacroiliac joint pain on her problem list.. Her primarily concern today is the Shoulder Pain; Hip Pain; and Back Pain   The patient comes in today for medication management. She indicates that she is pending corrective surgery for her left shoulder and left hip replacement as well. She indicates that she has not  had these done due to financial problems. For now she was stages with the pain medications.  Pharmacotherapy Review: Side-effects or Adverse reactions: None reported. Effectiveness: Described as relatively effective, allowing for increase in activities of daily living (ADL). Onset of action: Within expected pharmacological parameters. Duration of action: Within normal limits for medication. Peak effect: Timing and results are as within normal expected parameters. [place] PMP: Compliant with practice rules and regulations. DST: Compliant with practice rules and regulations. Lab work: No new labs ordered by our practice. Treatment compliance: Compliant. Substance Use Disorder (SUD) Risk Level: Low Planned course of action: Continue therapy as is.  Allergies: [name] is allergic to amoxicillin; clindamycin; codeine; sulfa antibiotics; shellfish allergy; decadron; papaya derivatives; prednisone; betadine; chlorhexidine; clarithromycin; clindamycin/lincomycin; and other.  Meds: The patient has a current medication list which includes the following prescription(s): albuterol, alprazolam, bupropion, fluticasone, fluticasone-salmeterol, loratadine, metoprolol succinate, montelukast, omeprazole, ondansetron, oxycodone hcl, aerochamber mv, linaclotide, oxycodone hcl, and oxycodone hcl. Requested Prescriptions   Signed Prescriptions Disp Refills  . Oxycodone HCl 10 MG TABS 120 tablet 0    Sig: Take 1 tablet (10 mg total) by mouth every 6 (six) hours as needed.  . Oxycodone HCl 10 MG TABS 120 tablet 0    Sig: Take 1 tablet (10 mg total) by mouth every 6 (six) hours as needed.  . Oxycodone HCl 10 MG TABS 120 tablet 0    Sig: Take 1 tablet (10 mg total) by mouth every 6 (six) hours as needed.    ROS: Constitutional: Afebrile, no chills, well hydrated and well nourished Gastrointestinal: negative Musculoskeletal:negative Neurological: negative Behavioral/Psych: negative  PFSH: Medical:  [name]  has a past medical history of Headache(784.0); PONV (postoperative nausea and vomiting); Abnormal EKG; Hypertension; Asthma; GERD (gastroesophageal reflux disease); DDD (degenerative disc disease); Pain; Anemia; History of blood transfusion; Inverted T wave; Pneumonia; History of kidney stones; Right foot drop;  Anxiety; Depression; and History of cervical spinal surgery (01/04/2015). Family: family history includes Aneurysm in her maternal grandmother and mother; Breast cancer in her paternal grandmother. Surgical:  has past surgical history that includes RIGHT HIP ARTHROSCOPY FOR LABRAL TEAR (ABOUT 2010); Anterior fusion cervical spine (MAY 2012); Nasal septum surgery (MARCH 2013); Back surgery (2009); Tonsillectomy; Abdominal hysterectomy; DIAGNOSTIC LAPAROSCOPIES - MULTIPLE FOR ENDOMETRIOSIS; Cholecystectomy; Hip arthroscopy (09/20/2011); Shoulder arthroscopy (05-06-12); Carpal tunnel release (05-06-12); Total hip arthroplasty (Right, 05/13/2012); Anterior cervical decomp/discectomy fusion (N/A, 10/30/2012); and Posterior cervical fusion/foraminotomy (N/A, 04/16/2013). Tobacco:  reports that she has never smoked. She has never used smokeless tobacco. Alcohol:  reports that she does not drink alcohol. Drug:  reports that she does not use illicit drugs.  Physical Exam: Vitals:  Today's Vitals   01/04/15 1026 01/04/15 1028  BP: 157/103   Pulse: 59   Temp: 98 F (36.7 C)   Resp: 18   Height: 5\' 4"  (1.626 m)   Weight: 150 lb (68.04 kg)   SpO2: 100%   PainSc: 7  8   Calculated BMI: Body mass index is 25.73 kg/(m^2). General appearance: alert, cooperative, appears stated age, no distress and mildly obese Eyes: conjunctivae/corneas clear. PERRL, EOM's intact. Fundi benign. Lungs: No evidence respiratory distress, no audible rales or ronchi and no use of accessory muscles of respiration Neck: no adenopathy, no carotid bruit, no JVD, supple, symmetrical, trachea midline and thyroid not enlarged,  symmetric, no tenderness/mass/nodules Back: symmetric, no curvature. ROM normal. No CVA tenderness. Extremities: extremities normal, atraumatic, no cyanosis or edema Pulses: 2+ and symmetric Skin: Skin color, texture, turgor normal. No rashes or lesions Neurologic: Grossly normal    Assessment: Encounter Diagnosis:  Primary Diagnosis: Chronic pain [G89.29]  Plan: [name] was seen today for shoulder pain, hip pain and back pain.  Diagnoses and all orders for this visit:  Chronic pain -     Oxycodone HCl 10 MG TABS; Take 1 tablet (10 mg total) by mouth every 6 (six) hours as needed. -     Oxycodone HCl 10 MG TABS; Take 1 tablet (10 mg total) by mouth every 6 (six) hours as needed. -     Oxycodone HCl 10 MG TABS; Take 1 tablet (10 mg total) by mouth every 6 (six) hours as needed.  Chronic pain syndrome  Opiate use  Uncomplicated opioid dependence ([place])  Long term prescription opiate use  Long term current use of opiate analgesic  Encounter for therapeutic drug level monitoring -     Drugs of abuse screen w/o alc, rtn urine-sln; Future  Chronic right hip pain Comments: Bending left hip replacement which she has not had done due to financial problems.  Chronic left shoulder pain Comments: Pending surgery for a rotator cough, which she has not been able to do due to financial problems.  Steroid intolerance  Cervical spine pain  Cervical spondylosis  Cervical facet arthropathy  History of cervical spinal surgery  Chronic pain of both shoulders  Failed back surgical syndrome  Epidural fibrosis  Cervical facet syndrome (bilateral)  Chronic right sacroiliac joint pain     There are no Patient Instructions on file for this visit. Medications discontinued today:  Medications Discontinued During This Encounter  Medication Reason  . [facility] D3 2000 UNITS CAPS Error  . metoprolol succinate (TOPROL-XL) 50 MG 24 hr tablet Error  . OxyCODONE (OXYCONTIN) 10 mg T12A 12  hr tablet Error  . Oxycodone HCl 10 MG TABS Reorder   Medications administered today:  Ms.  Buzzard had no medications administered during this visit.  Primary Care Physician: Margarita Rana, MD Location: [facility] Note by: Kathlen Brunswick. Dossie Arbour, M.D, DABA, DABAPM, DABPM, DABIPP, FIPP

## 2015-01-04 NOTE — Progress Notes (Signed)
Safety precautions to be maintained throughout the outpatient stay will include: orient to surroundings, keep bed in low position, maintain call bell within reach at all times, provide assistance with transfer out of bed and ambulation.  

## 2015-01-05 ENCOUNTER — Encounter: Payer: Self-pay | Admitting: Pain Medicine

## 2015-01-14 ENCOUNTER — Other Ambulatory Visit: Payer: Self-pay | Admitting: Family Medicine

## 2015-01-14 DIAGNOSIS — F411 Generalized anxiety disorder: Secondary | ICD-10-CM

## 2015-01-14 DIAGNOSIS — K589 Irritable bowel syndrome without diarrhea: Secondary | ICD-10-CM

## 2015-01-14 DIAGNOSIS — R002 Palpitations: Secondary | ICD-10-CM

## 2015-01-14 MED ORDER — ALPRAZOLAM 1 MG PO TABS: ORAL_TABLET | ORAL | Status: DC

## 2015-01-14 NOTE — Telephone Encounter (Signed)
[facility] 

## 2015-01-14 NOTE — Telephone Encounter (Signed)
Printed, please fax or call in to pharmacy. Thank you.   

## 2015-01-14 NOTE — Telephone Encounter (Signed)
Pt contacted office for refill request on the following medications:  [facility] [place].  ZG#[phone]/RF  ALPRAZolam (XANAX) 1 MG   metoprolol succinate (TOPROL-XL) 50 MG 24 hr   Linaclotide (LINZESS) 290 MCG CAPS   Valtrex 1gm

## 2015-01-28 ENCOUNTER — Other Ambulatory Visit: Payer: Self-pay | Admitting: Pain Medicine

## 2015-02-03 ENCOUNTER — Other Ambulatory Visit: Payer: Self-pay | Admitting: Family Medicine

## 2015-02-03 DIAGNOSIS — Z1231 Encounter for screening mammogram for malignant neoplasm of breast: Secondary | ICD-10-CM

## 2015-02-10 ENCOUNTER — Other Ambulatory Visit: Payer: Self-pay | Admitting: Family Medicine

## 2015-02-10 DIAGNOSIS — F32A Depression, unspecified: Secondary | ICD-10-CM

## 2015-02-10 DIAGNOSIS — B001 Herpesviral vesicular dermatitis: Secondary | ICD-10-CM

## 2015-02-10 DIAGNOSIS — F329 Major depressive disorder, single episode, unspecified: Secondary | ICD-10-CM

## 2015-02-16 ENCOUNTER — Ambulatory Visit
Admission: RE | Admit: 2015-02-16 | Discharge: 2015-02-16 | Disposition: A | Discharge status: Home / Self Care | Payer: Medicare Other | Source: Ambulatory Visit | Attending: Family Medicine | Admitting: Family Medicine

## 2015-02-16 DIAGNOSIS — Z1231 Encounter for screening mammogram for malignant neoplasm of breast: Secondary | ICD-10-CM | POA: Insufficient documentation

## 2015-03-03 ENCOUNTER — Encounter: Payer: Self-pay | Admitting: Family Medicine

## 2015-03-03 ENCOUNTER — Ambulatory Visit (INDEPENDENT_AMBULATORY_CARE_PROVIDER_SITE_OTHER): Payer: Medicare Other | Admitting: Family Medicine

## 2015-03-03 VITALS — BP 138/86 | HR 88 | Temp 98.9°F | Resp 16 | Wt 155.0 lb

## 2015-03-03 DIAGNOSIS — B001 Herpesviral vesicular dermatitis: Secondary | ICD-10-CM | POA: Diagnosis not present

## 2015-03-03 DIAGNOSIS — J069 Acute upper respiratory infection, unspecified: Secondary | ICD-10-CM | POA: Diagnosis not present

## 2015-03-03 MED ORDER — VALACYCLOVIR HCL 1 G PO TABS: ORAL_TABLET | ORAL | Status: DC

## 2015-03-03 MED ORDER — DOXYCYCLINE HYCLATE 100 MG PO TABS: 100.0000 mg | ORAL_TABLET | Freq: Two times a day (BID) | ORAL | Status: DC

## 2015-03-03 NOTE — Progress Notes (Signed)
Patient ID: [name], female   DOB: [birth year], 54 y.o.   MRN: [id number]        Patient: [name] Female    DOB: 02/14/1961   54 y.o.   MRN: [id number] Visit Date: 03/03/2015  Today's Provider: Margarita Rana, MD   Chief Complaint  Patient presents with  . URI   Subjective:    URI  This is a new problem. The current episode started yesterday. The problem has been gradually worsening. There has been no fever. Associated symptoms include congestion, coughing, ear pain, headaches, neck pain, a plugged ear sensation, rhinorrhea, sinus pain, sneezing, a sore throat and wheezing. Pertinent negatives include no diarrhea, nausea or swollen glands.       Allergies  Allergen Reactions  . Amoxicillin Hives  . Clindamycin Hives  . Codeine Hives  . Sulfa Antibiotics Nausea And Vomiting and Hives       . Shellfish Allergy Hives  . Decadron [Dexamethasone] Other (See Comments)    Hot flashes, insomnia, "manic"  . Papaya Derivatives Hives  . Prednisone     High blood pressure, flushed, mood changes, heart palpitations  . Betadine [Povidone Iodine] Hives       . Chlorhexidine Hives       . Clarithromycin Hives  . Clindamycin/Lincomycin Hives  . Other Hives     [place]    Previous Medications   ALBUTEROL (PROVENTIL HFA;VENTOLIN HFA) 108 (90 BASE) MCG/ACT INHALER    Inhale 2 puffs into the lungs every 6 (six) hours as needed for wheezing.   ALPRAZOLAM (XANAX) 1 MG TABLET    TAKE 1/2 A TABLET BY MOUTH EVERY MORNING, TAKE 1/2 A TABLET AT LUNCH AND 2 TABLETS AT BEDTIME   BUPROPION (WELLBUTRIN SR) 150 MG 12 HR TABLET    TAKE 1 TABLET BY MOUTH TWICE A DAY   FLUTICASONE (FLONASE) 50 MCG/ACT NASAL SPRAY    Place 2 sprays into the nose daily.   FLUTICASONE-SALMETEROL (ADVAIR DISKUS) 500-50 MCG/DOSE AEPB    Inhale 1 puff into the lungs 2 (two) times daily.   LINZESS 290 MCG CAPS CAPSULE    TAKE ONE CAPSULE BY MOUTH EVERY DAY   LORATADINE (CLARITIN REDITABS) 10 MG DISSOLVABLE  TABLET    Take 1 tablet by mouth daily.   METOPROLOL SUCCINATE (TOPROL-XL) 50 MG 24 HR TABLET    TAKE 1 TABLET BY MOUTH EVERY DAY   MONTELUKAST (SINGULAIR) 10 MG TABLET    TAKE 1 TABLET BY MOUTH EVERY DAY   OMEPRAZOLE (PRILOSEC) 20 MG CAPSULE    TAKE ONE CAPSULE BY MOUTH TWICE A DAY   ONDANSETRON (ZOFRAN ODT) 8 MG DISINTEGRATING TABLET    Take 1 tablet (8 mg total) by mouth every 8 (eight) hours as needed for nausea or vomiting.   OXYCODONE HCL 10 MG TABS    Take 1 tablet (10 mg total) by mouth every 6 (six) hours as needed.   SPACER/AERO-HOLDING CHAMBERS (AEROCHAMBER MV) INHALER    by Does not apply route.   VALACYCLOVIR (VALTREX) 1000 MG TABLET    TAKE TWO TABLETS BY MOUTH TWICE DAILY FOR ONE DAY FEVER BLISTER    Review of Systems  HENT: Positive for congestion, ear pain, hearing loss, postnasal drip, rhinorrhea, sinus pressure, sneezing, sore throat, trouble swallowing and voice change. Negative for ear discharge, nosebleeds and tinnitus.   Eyes: Negative for photophobia, pain, discharge, redness, itching and visual disturbance.  Respiratory: Positive for cough, chest tightness, shortness of breath and wheezing. Negative for  apnea, choking and stridor.   Cardiovascular: Negative.   Gastrointestinal: Negative.  Negative for nausea and diarrhea.  Musculoskeletal: Positive for neck pain.  Neurological: Positive for headaches. Negative for dizziness and light-headedness.    Social History  Substance Use Topics  . Smoking status: Never Smoker   . Smokeless tobacco: Never Used  . Alcohol Use: No   Objective:   BP 138/86 mmHg  Pulse 88  Temp(Src) 98.9 F (37.2 C) (Oral)  Resp 16  Wt 155 lb (70.308 kg)  Physical Exam  Constitutional: She is oriented to person, place, and time. She appears well-developed and well-nourished.  HENT:  Head: Normocephalic and atraumatic.  Right Ear: External ear normal.  Left Ear: External ear normal.  Mouth/Throat: Oropharynx is clear and moist.    Eyes: Conjunctivae and EOM are normal. Pupils are equal, round, and reactive to light.  Neck: Normal range of motion. Neck supple.  Cardiovascular: Normal rate and regular rhythm.   Pulmonary/Chest: Effort normal and breath sounds normal.  Neurological: She is alert and oriented to person, place, and time.  Psychiatric: She has a normal mood and affect. Her behavior is normal. Judgment and thought content normal.      Assessment & Plan:     1. Upper respiratory infection New problem wWorsening. Concerning for asthma exacerbation and sinusitis. Can not tolerate prednisone.  Will call if worsens or does not improve.  - doxycycline (VIBRA-TABS) 100 MG tablet; Take 1 tablet (100 mg total) by mouth 2 (two) times daily.  Dispense: 20 tablet; Refill: 0  2. Fever blister Stable. Will refill medication.   - valACYclovir (VALTREX) 1000 MG tablet; TAKE TWO TABLETS BY MOUTH TWICE DAILY FOR ONE DAY FEVER BLISTER  Dispense: 4 tablet; Refill: 5       Patient was seen and examined by Jerrell Belfast, MD, and note scribed by Ashley Royalty, CMA.  I have reviewed the document for accuracy and completeness and I agree with above. - Jerrell Belfast, MD   Margarita Rana, MD  [facility]

## 2015-03-06 ENCOUNTER — Emergency Department: Payer: Medicare Other

## 2015-03-06 ENCOUNTER — Encounter: Payer: Self-pay | Admitting: Emergency Medicine

## 2015-03-06 ENCOUNTER — Emergency Department
Admission: EM | Admit: 2015-03-06 | Discharge: 2015-03-06 | Disposition: A | Discharge status: Home / Self Care | Payer: Medicare Other | Attending: Emergency Medicine | Admitting: Emergency Medicine

## 2015-03-06 DIAGNOSIS — Z792 Long term (current) use of antibiotics: Secondary | ICD-10-CM | POA: Diagnosis not present

## 2015-03-06 DIAGNOSIS — Z7951 Long term (current) use of inhaled steroids: Secondary | ICD-10-CM | POA: Diagnosis not present

## 2015-03-06 DIAGNOSIS — I10 Essential (primary) hypertension: Secondary | ICD-10-CM | POA: Diagnosis not present

## 2015-03-06 DIAGNOSIS — Z880 Allergy status to penicillin: Secondary | ICD-10-CM | POA: Insufficient documentation

## 2015-03-06 DIAGNOSIS — J45901 Unspecified asthma with (acute) exacerbation: Secondary | ICD-10-CM | POA: Diagnosis not present

## 2015-03-06 DIAGNOSIS — R42 Dizziness and giddiness: Secondary | ICD-10-CM | POA: Insufficient documentation

## 2015-03-06 DIAGNOSIS — R51 Headache: Secondary | ICD-10-CM | POA: Diagnosis not present

## 2015-03-06 DIAGNOSIS — R29818 Other symptoms and signs involving the nervous system: Secondary | ICD-10-CM | POA: Diagnosis not present

## 2015-03-06 DIAGNOSIS — R519 Headache, unspecified: Secondary | ICD-10-CM

## 2015-03-06 DIAGNOSIS — Z79899 Other long term (current) drug therapy: Secondary | ICD-10-CM | POA: Insufficient documentation

## 2015-03-06 DIAGNOSIS — R112 Nausea with vomiting, unspecified: Secondary | ICD-10-CM | POA: Insufficient documentation

## 2015-03-06 DIAGNOSIS — J189 Pneumonia, unspecified organism: Secondary | ICD-10-CM | POA: Diagnosis not present

## 2015-03-06 LAB — INFLUENZA PANEL BY PCR (TYPE A & B)
H1N1 flu by pcr: NOT DETECTED
INFLBPCR: NEGATIVE
Influenza A By PCR: NEGATIVE

## 2015-03-06 LAB — DIFFERENTIAL
BASOS ABS: 0.1 10*3/uL (ref 0–0.1)
BASOS PCT: 1 %
Eosinophils Absolute: 0.1 10*3/uL (ref 0–0.7)
Eosinophils Relative: 1 %
LYMPHS PCT: 17 %
Lymphs Abs: 1.3 10*3/uL (ref 1.0–3.6)
Monocytes Absolute: 0.3 10*3/uL (ref 0.2–0.9)
Monocytes Relative: 4 %
NEUTROS ABS: 5.9 10*3/uL (ref 1.4–6.5)
NEUTROS PCT: 77 %

## 2015-03-06 LAB — APTT: APTT: 33 s (ref 24–36)

## 2015-03-06 LAB — CBC
HCT: 38.3 % (ref 35.0–47.0)
Hemoglobin: 13 g/dL (ref 12.0–16.0)
MCH: 28 pg (ref 26.0–34.0)
MCHC: 34 g/dL (ref 32.0–36.0)
MCV: 82.3 fL (ref 80.0–100.0)
PLATELETS: 342 10*3/uL (ref 150–440)
RBC: 4.64 MIL/uL (ref 3.80–5.20)
RDW: 13.2 % (ref 11.5–14.5)
WBC: 7.7 10*3/uL (ref 3.6–11.0)

## 2015-03-06 LAB — COMPREHENSIVE METABOLIC PANEL
ALBUMIN: 4.1 g/dL (ref 3.5–5.0)
ALK PHOS: 136 U/L — AB (ref 38–126)
ALT: 10 U/L — AB (ref 14–54)
AST: 20 U/L (ref 15–41)
Anion gap: 9 (ref 5–15)
BUN: 9 mg/dL (ref 6–20)
CALCIUM: 9.3 mg/dL (ref 8.9–10.3)
CO2: 25 mmol/L (ref 22–32)
CREATININE: 0.67 mg/dL (ref 0.44–1.00)
Chloride: 101 mmol/L (ref 101–111)
GFR calc non Af Amer: >60 mL/min (ref >60)
Glucose, Bld: 139 mg/dL — ABNORMAL HIGH (ref 65–99)
Potassium: 3.6 mmol/L (ref 3.5–5.1)
SODIUM: 135 mmol/L (ref 135–145)
TOTAL PROTEIN: 7.8 g/dL (ref 6.5–8.1)
Total Bilirubin: 0.9 mg/dL (ref 0.3–1.2)

## 2015-03-06 LAB — CSF CELL COUNT WITH DIFFERENTIAL
EOS CSF: 0 %
Eosinophils, CSF: 0 %
LYMPHS CSF: 61 %
Lymphs, CSF: 35 %
MONOCYTE-MACROPHAGE-SPINAL FLUID: 18 %
Monocyte-Macrophage-Spinal Fluid: 23 %
OTHER CELLS CSF: 0
Other Cells, CSF: 0
RBC COUNT CSF: 1088 /mm3 — AB (ref 0–3)
RBC Count, CSF: 2910 /mm3 — ABNORMAL HIGH (ref 0–3)
SEGMENTED NEUTROPHILS-CSF: 21 %
SEGMENTED NEUTROPHILS-CSF: 42 %
Tube #: 3
WBC CSF: 5 /mm3
WBC, CSF: 10 /mm3

## 2015-03-06 LAB — PROTIME-INR
INR: 1.05
PROTHROMBIN TIME: 13.9 s (ref 11.4–15.0)

## 2015-03-06 LAB — GLUCOSE, CAPILLARY: Glucose-Capillary: 124 mg/dL — ABNORMAL HIGH (ref 65–99)

## 2015-03-06 LAB — PROTEIN, CSF: TOTAL PROTEIN, CSF: 35 mg/dL (ref 15–45)

## 2015-03-06 LAB — TROPONIN I

## 2015-03-06 LAB — GLUCOSE, CSF: GLUCOSE CSF: 69 mg/dL (ref 40–70)

## 2015-03-06 MED ORDER — ONDANSETRON HCL 4 MG/2ML IJ SOLN
4.0000 mg | Freq: Once | INTRAMUSCULAR | Status: AC
  Administered 2015-03-06: 4 mg via INTRAVENOUS

## 2015-03-06 MED ORDER — FENTANYL CITRATE (PF) 100 MCG/2ML IJ SOLN
50.0000 ug | Freq: Once | INTRAMUSCULAR | Status: AC
Start: 2015-03-06 — End: 2015-03-06
  Administered 2015-03-06: 50 ug via INTRAVENOUS
  Filled 2015-03-06: qty 2

## 2015-03-06 MED ORDER — LORAZEPAM 2 MG/ML IJ SOLN: 2.0000 mg | Freq: Once | INTRAMUSCULAR | Status: DC

## 2015-03-06 MED ORDER — DIPHENHYDRAMINE HCL 50 MG/ML IJ SOLN
25.0000 mg | Freq: Once | INTRAMUSCULAR | Status: AC
  Administered 2015-03-06: 25 mg via INTRAVENOUS
  Filled 2015-03-06: qty 1

## 2015-03-06 MED ORDER — LORAZEPAM 2 MG/ML IJ SOLN
INTRAMUSCULAR | Status: AC
  Filled 2015-03-06: qty 1

## 2015-03-06 MED ORDER — METOCLOPRAMIDE HCL 5 MG/ML IJ SOLN
10.0000 mg | Freq: Once | INTRAMUSCULAR | Status: AC
  Administered 2015-03-06: 10 mg via INTRAVENOUS
  Filled 2015-03-06: qty 2

## 2015-03-06 MED ORDER — ONDANSETRON HCL 4 MG/2ML IJ SOLN
4.0000 mg | Freq: Once | INTRAMUSCULAR | Status: AC
  Administered 2015-03-06: 4 mg via INTRAVENOUS
  Filled 2015-03-06: qty 2

## 2015-03-06 MED ORDER — IOHEXOL 350 MG/ML SOLN
80.0000 mL | Freq: Once | INTRAVENOUS | Status: AC | PRN
  Administered 2015-03-06: 80 mL via INTRAVENOUS

## 2015-03-06 MED ORDER — IPRATROPIUM-ALBUTEROL 0.5-2.5 (3) MG/3ML IN SOLN
3.0000 mL | Freq: Once | RESPIRATORY_TRACT | Status: AC
  Administered 2015-03-06: 3 mL via RESPIRATORY_TRACT
  Filled 2015-03-06: qty 3

## 2015-03-06 MED ORDER — FENTANYL CITRATE (PF) 100 MCG/2ML IJ SOLN
50.0000 ug | Freq: Once | INTRAMUSCULAR | Status: AC
  Administered 2015-03-06: 50 ug via INTRAVENOUS
  Filled 2015-03-06: qty 2

## 2015-03-06 MED ORDER — ONDANSETRON HCL 4 MG/2ML IJ SOLN
INTRAMUSCULAR | Status: AC
  Administered 2015-03-06: 4 mg via INTRAVENOUS
  Filled 2015-03-06: qty 2

## 2015-03-06 NOTE — Procedures (Signed)
Successful L5S1 fluoro LP No comp 10cc csf removed Labs sent Full report in pacs

## 2015-03-06 NOTE — ED Notes (Signed)
Patient transported to lumbar puncture

## 2015-03-06 NOTE — ED Provider Notes (Signed)
-----------------------------------------   10:04 PM on 03/06/2015 -----------------------------------------  Patient is set up for discharge after an extensive and covering intensive workup with a neurosurgical consult by my colleague. Apparently, the patient had some tingling which she states was just around her left cheek not her entire left face with no weakness and out of abundance of caution and it was decided that a MRI will be ordered. The patient however is deathly afraid of MRI machines and states she needs 20 of Valium together and currently doing it. She would rather not have the test done. She very strongly feels that she is okay to go home. She understands limitation of our workup without an MRI. Fortunately, her NIH stroke scale is 0.Cranial nerves II through XII are grossly intact 5 out of 5 strength bilateral upper and lower extremity. Finger to nose within normal limits heel to shin within normal limits, speech is normal with no word finding difficulty or dysarthria, reflexes symmetric, pupils are equally round and reactive to light, there is no pronator drift, sensation is normal, vision is intact to confrontation, gait is deferred, there is no nystagmus, normal neurologic exam she has no headache and she has no other concerns. She is awake and alert and states that she is tired of having test done in the emergency room and wants to go home. Given that the patient declines MRI at this time we will discharge her home with extensive outpatient follow-up already arranged and return precautions given and understood.  Schuyler Amor, MD 03/06/15 2206

## 2015-03-06 NOTE — ED Notes (Signed)
Patient transported to MRI by Kaylyn Lim

## 2015-03-06 NOTE — ED Notes (Signed)
MD at bedside. 

## 2015-03-06 NOTE — ED Notes (Signed)
Reviewed d/c instructions and follow-up care with pt. Pt verbalized understanding 

## 2015-03-06 NOTE — ED Notes (Signed)
Pt states she woke up this am and went to the [facility] and had sudden onset of headache with n,v, dizziness, and feeling like she was going to pass out, pt hypertensive in triage, states she was dx with pneumonia on Wednesday and is currently taking doxycycline

## 2015-03-06 NOTE — ED Notes (Signed)
Pt would like her boyfriend [name] called if she is transferred/admitted.

## 2015-03-06 NOTE — ED Notes (Signed)
Patient transported to CT 

## 2015-03-06 NOTE — ED Provider Notes (Signed)
[facility] Emergency Department Provider Note   ____________________________________________  Time seen: 12 PM I have reviewed the triage vital signs and the triage nursing note.  HISTORY  Chief Complaint Headache and Emesis   Historian Patient  HPI [name] is a 54 y.o. female here with complaint of acute onset headache around 9:30 this morning when patient was essentially at rest. Pain felt like stabbing in the center of her head. She's never had a headache like this before. Severity was 10 out of 10 and abrupt in onset. No weakness, numbness, confusion.  She has had nausea and vomiting, with persistent nausea. She also had lightheadedness and dizziness.  No recent fevers, but states that she was seen for cough plus congestion on Wednesday and was started on doxycycline for "pneumonia".  She feels like she is having some improvement from that standpoint.  She's had multiple neck and back surgeries with metal rods in her neck and back.  She has a family history of mom and sister who have had ruptured brain aneurysms.    Past Medical History  Diagnosis Date  . Headache(784.0)     AND NECK PAIN--STATES RECENT TEST SHOW CERVICAL DEGENERATION  . PONV (postoperative nausea and vomiting)     PT GIVES HX OF N&V AND FEVER WITH SURGERIES YEARS AGO--BUT NO PROBLEMS WITH MORE RECENT SURGERIES--STATES NOT MALIGNANT HYPERTHERMIA  . 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 Hypertension   . Asthma     INHALERS WITH FLARE -UPS  . GERD (gastroesophageal reflux disease)   . DDD (degenerative disc disease)     CERVICAL AND LUMBAR-CHRONIC PAIN, RT HIP LABRAL TEAR  . Pain     CHRONIC NECK AND BACK PAIN - LIMITED ROM NECK - S/P FUSIONS CERVICAL AND LUMBAR  . Anemia     Iron Infusion-8 yrs ago  . History of blood transfusion     s/p back surgery  . Inverted T wave   . Pneumonia    . History of kidney stones   . Right foot drop   . Anxiety   . Depression     PT STATES A LOT OF STRESS IN HER LIFE  . History of cervical spinal surgery 01/04/2015    Patient Active Problem List   Diagnosis Date Noted  . Chronic left shoulder pain 01/04/2015  . Chronic right hip pain 01/04/2015  . Encounter for therapeutic drug level monitoring 01/04/2015  . Long term current use of opiate analgesic 01/04/2015  . Long term prescription opiate use 01/04/2015  . Uncomplicated opioid dependence ([place]) 01/04/2015  . Opiate use 01/04/2015  . Chronic pain syndrome 01/04/2015  . Chronic pain 01/04/2015  . Steroid intolerance 01/04/2015  . Opioid dependence ([place]) 01/04/2015  . Cervical spine pain 01/04/2015  . Cervical spondylosis 01/04/2015  . Cervical facet arthropathy (bilateral) 01/04/2015  . History of cervical spinal surgery 01/04/2015  . Chronic pain of both shoulders 01/04/2015  . 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 Lumbar spondylosis 01/04/2015  . Cervical facet syndrome (bilateral) 01/04/2015  . Chronic right sacroiliac joint pain 01/04/2015  . DOE (dyspnea on exertion) 09/03/2014  . Asthma, mild 08/18/2014  . Blurred vision 08/18/2014  . Benign paroxysmal positional nystagmus 08/18/2014  . Cervical disc disease 08/18/2014  . Narrowing of intervertebral disc space 08/18/2014  . Clinical depression 08/18/2014  . Fatigue  08/18/2014  . Acid reflux 08/18/2014  . Cephalalgia 08/18/2014  . Right hip arthralgia 08/18/2014  . H/O arthrodesis 08/18/2014  . Cannot sleep 08/18/2014  . Awareness of heartbeats 08/18/2014  . RAD (reactive airway disease) 08/18/2014  . Apnea, sleep 08/18/2014  . Orthostatic hypotension 04/22/2013  . Dizziness 04/22/2013  . Chronic neck pain 04/16/2013  . Postoperative anemia due to acute blood loss 05/14/2012  . Postop Hyponatremia 05/14/2012  . Right hip osteoarthritis 05/13/2012  . Right hip pain  09/20/2011  . Lumbar disc disorder with myelopathy 09/16/2007  . 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 Anxiety state 08/03/2003  . Colon, diverticulosis 07/15/2003  . Duodenal ulcer with hemorrhage and perforation ([place]) 04/27/2003  . IBS (irritable bowel syndrome) 04/27/2003  . Esophagitis, reflux 02/11/2003  . 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 Right hip arthroscopy for labral tear  ABOUT 2010    2012 also  . Anterior fusion cervical spine  MAY 2012    AT [facility]  . Nasal septum surgery  MARCH 2013    IN [place]  . Back surgery  2009    LUMBAR FUSION WITH RODS   . Tonsillectomy      AS A CHILD  . Abdominal hysterectomy    . Diagnostic laparoscopies - multiple for endometriosis    . Cholecystectomy    . Hip arthroscopy  09/20/2011    Procedure: ARTHROSCOPY HIP;  Surgeon: Gearlean Alf, MD;  Location: [facility];  Service: Orthopedics;  Laterality: Right;  Right Hip Scope with Labral Debridement  . Shoulder arthroscopy  05-06-12    bone spur  . Carpal tunnel release  05-06-12    Right  . Total hip arthroplasty Right 05/13/2012    Procedure: TOTAL HIP ARTHROPLASTY ANTERIOR APPROACH;  Surgeon: Gearlean Alf, MD;  Location: [facility];  Service: Orthopedics;  Laterality: Right;  . Anterior cervical decomp/discectomy fusion N/A 10/30/2012    Procedure: ACDF C5-6, EXPLORATION AND HARDWARE REMOVAL C6-7;  Surgeon: Melina Schools, MD;  Location: [place];  Service: Orthopedics;  Laterality: N/A;  . Posterior cervical fusion/foraminotomy N/A 04/16/2013    Procedure: REMOVAL CERVICAL PLATES AND INTERBODY CAGE/POSTERIOR CERVICAL SPINAL FUSION C4 - C6/C5 CORPECTOMY/C4 - C6 FUSION WITH ILIAC CREST BONE GRAFT;  Surgeon: Melina Schools, MD;  Location: [place];  Service: Orthopedics;  Laterality: N/A;  . Breast biopsy Right 2008    benign  . Breast excisional biopsy Left 1998    benign    Current Outpatient Rx  Name  Route  Sig  Dispense  Refill   . albuterol (PROVENTIL HFA;VENTOLIN HFA) 108 (90 BASE) MCG/ACT inhaler   Inhalation   Inhale 2 puffs into the lungs every 6 (six) hours as needed for wheezing.         Marland Kitchen ALPRAZolam (XANAX) 1 MG tablet      TAKE 1/2 A TABLET BY MOUTH EVERY MORNING, TAKE 1/2 A TABLET AT LUNCH AND 2 TABLETS AT BEDTIME   90 tablet   5     Not to exceed 4 additional fills before 03/27/2015 ...   . buPROPion (WELLBUTRIN SR) 150 MG 12 hr tablet      TAKE 1 TABLET BY MOUTH TWICE A DAY   60 tablet   5   . doxycycline (VIBRA-TABS) 100 MG tablet   Oral   Take 1 tablet (100 mg total) by mouth 2 (two) times daily.   20 tablet   0   .  fluticasone (FLONASE) 50 MCG/ACT nasal spray   Nasal   Place 2 sprays into the nose daily.         . Fluticasone-Salmeterol (ADVAIR DISKUS) 500-50 MCG/DOSE AEPB   Inhalation   Inhale 1 puff into the lungs 2 (two) times daily.   60 each   5   . LINZESS 290 MCG CAPS capsule      TAKE ONE CAPSULE BY MOUTH EVERY DAY   30 capsule   2   . loratadine (CLARITIN REDITABS) 10 MG dissolvable tablet   Oral   Take 1 tablet by mouth daily.         . metoprolol succinate (TOPROL-XL) 50 MG 24 hr tablet      TAKE 1 TABLET BY MOUTH EVERY DAY   30 tablet   5   . montelukast (SINGULAIR) 10 MG tablet      TAKE 1 TABLET BY MOUTH EVERY DAY   30 tablet   5   . omeprazole (PRILOSEC) 20 MG capsule      TAKE ONE CAPSULE BY MOUTH TWICE A DAY   60 capsule   5   . ondansetron (ZOFRAN ODT) 8 MG disintegrating tablet   Oral   Take 1 tablet (8 mg total) by mouth every 8 (eight) hours as needed for nausea or vomiting.   20 tablet   0   . Oxycodone HCl 10 MG TABS   Oral   Take 1 tablet (10 mg total) by mouth every 6 (six) hours as needed.   120 tablet   0     Do not place this medication, or any other prescri ...   . Spacer/Aero-Holding Chambers (AEROCHAMBER MV) inhaler   Does not apply   by Does not apply route.         . valACYclovir (VALTREX) 1000 MG tablet       TAKE TWO TABLETS BY MOUTH TWICE DAILY FOR ONE DAY FEVER BLISTER   4 tablet   5     Allergies Amoxicillin; Clindamycin; Codeine; Sulfa antibiotics; Shellfish allergy; Decadron; Papaya derivatives; Prednisone; Betadine; Chlorhexidine; Clarithromycin; Clindamycin/lincomycin; and Other  Family History  Problem Relation Age of Onset  . Aneurysm Mother   . Aneurysm Maternal Grandmother   . Breast cancer Paternal Grandmother 50    Social History Social History  Substance Use Topics  . Smoking status: Never Smoker   . Smokeless tobacco: Never Used  . Alcohol Use: No    Review of Systems  Constitutional: Negative for fever. Eyes: Negative for visual changes. ENT: Negative for sore throat. Cardiovascular: Negative for chest pain. Respiratory: has been short of breath this week, recently diagnosed with pneumonia.. Gastrointestinal: Negative for abdominal pain, vomiting and diarrhea. Genitourinary: Negative for dysuria. Musculoskeletal: Negative for back pain. Skin: Negative for rash. Neurological: Positive for headache. Generalized in location. The pain. 10 point Review of Systems otherwise negative ____________________________________________   PHYSICAL EXAM:  VITAL SIGNS: ED Triage Vitals  Enc Vitals Group     BP 03/06/15 1104 170/113 mmHg     Pulse Rate 03/06/15 1104 73     Resp 03/06/15 1104 18     Temp 03/06/15 1104 97.8 F (36.6 C)     Temp Source 03/06/15 1104 Oral     SpO2 03/06/15 1104 100 %     Weight 03/06/15 1104 150 lb (68.04 kg)     Height 03/06/15 1104 5\' 4"  (1.626 m)     Head Cir --      Peak Flow --  Pain Score 03/06/15 1105 10     Pain Loc --      Pain Edu? --      Excl. in [place]? --      Constitutional: Alert and oriented. Well appearing and in no distress. Eyes: Conjunctivae are normal. PERRL. Normal extraocular movements. Funduscopic exam limited but visualized portion normal. ENT   Head: Normocephalic and atraumatic.   Nose: No  congestion/rhinnorhea.   Mouth/Throat: Mucous membranes are moist.   Neck: No stridor. No stiffness. Cardiovascular/Chest: Normal rate, regular rhythm.  No murmurs, rubs, or gallops. Respiratory: Normal respiratory effort without tachypnea nor retractions. Mild decreased breath sounds in all fields with end expiratory wheeze. Gastrointestinal: Soft. No distention, no guarding, no rebound. Nontender   Genitourinary/rectal:Deferred Musculoskeletal: Nontender with normal range of motion in all extremities. No joint effusions.  No lower extremity tenderness.  No edema. Neurologic:  Normal speech and language. Normal coordination. No facial droop. V2 and V3 paresthesia. 5 out of 5 strength in 4 extremities. Skin:  Skin is warm, dry and intact. No rash noted. Psychiatric: Mood and affect are normal. Speech and behavior are normal. Patient exhibits appropriate insight and judgment.  ____________________________________________   EKG I, Lisa Roca, MD, the attending physician have personally viewed and interpreted all ECGs.  60 bpm normal sinus rhythm. Narrow QRS. Normal axis. Nonspecific ST-T wave ____________________________________________  LABS (pertinent positives/negatives)  CBC within normal limits Compressive metabolic panel without significant abnormality Troponin less than 0.03 Influenza negative CSF protein 35, CSF glucose 69 Gram stain rare white blood cells, no organisms CSF tube 1,  no xanthochromia, red blood cells 2910, white blood cells 10 CSF tube 3, no xanthochromia, red blood cells 1088, white blood cells 5  ____________________________________________  RADIOLOGY All Xrays were viewed by me. Imaging interpreted by Radiologist.  Chest x-ray report: No active disease  CT head without contrast: Normal exam. No acute intracranial abnormalities noted.  CT head:  IMPRESSION: Normal variant CTA circle of Willis without significant proximal stenosis, aneurysm,  or branch vessel occlusion.  MRI brain: Pending __________________________________________  PROCEDURES  Procedure(s) performed: None  Critical Care performed: None  ____________________________________________   ED COURSE / ASSESSMENT AND PLAN  CONSULTATIONS: Interventional radiologist, who will perform fluoroscopy guided LP.  Neurologist Harris - phone consultation, discussed clinical exam, and CSF results. 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  Recommends CTA. -- repeat phone call, discussed negative CTA for aneurysm.  Recommends outpatient follow up.  Pertinent labs & imaging results that were available during my care of the patient were reviewed by me and considered in my medical decision making (see chart for details).  Patient has an acute onset severe headache which is somewhat improved after fentanyl and Zofran, but still 7 out of 10. On physical exam she has some paresthesia to V2 and V3, and otherwise without neurologic abnormality.  Although this headache is nonspecific, I have a low threshold for evaluation for some retinal hemorrhage given her family history of ruptured brain aneurysms. Her head CT is negative.  Given the patient has hardware in her lumbar spine, patient requested fluoroscopy guided lumbar puncture.  She was also found have some wheezing, and she was diagnosed with "pneumonia" despite no chest x-ray, and I did add a chest x-ray. Her chest x-ray was clear. She was still wheezing some, and was given a DuoNeb treatment.  CSF consistent with traumatic LP, rather than subarachnoid hemorrhage.  Approximately 63% decrease from tube 1 to tube 3.  No xanthochromia. After discussion with the neurologist, CT angiogram performed and negative for any aneurysm. After we discussed this again, he recommended outpatient follow-up.  Patient headache down to 3 out of 10. She is  still having some paresthesia, and so I discussed obtaining MRI brain to rule out stroke.  Patient care transferred to Dr. Burlene Arnt, MRI brain pending.  If negative, will be discharged with my instructions.    Patient / Family / Caregiver informed of clinical course, medical decision-making process, and agree with plan.   I discussed return precautions, follow-up instructions, and discharged instructions with patient and/or family.  ___________________________________________   FINAL CLINICAL IMPRESSION(S) / ED DIAGNOSES   Final diagnoses:  Acute headache       Lisa Roca, MD 03/06/15 2014

## 2015-03-06 NOTE — Discharge Instructions (Signed)
You were evaluated for headache and found to also have face tingling/numbness. Exam and evaluation were reassuring here in the emergency department.  You may refer to follow-up with the primary care physician, as well as a neurologist.  Return to emergency department for any new or worsening condition including any confusion or altered mental status, fever, weakness or numbness, coordination problem, facial droop, problems finding words, or any other symptoms concerning to you.   General Headache Without Cause A headache is pain or discomfort felt around the head or neck area. There are many causes and types of headaches. In some cases, the cause may not be found.  HOME CARE  Managing Pain  Take over-the-counter and prescription medicines only as told by your doctor.  Lie down in a dark, quiet room when you have a headache.  If directed, apply ice to the head and neck area:  Put ice in a plastic bag.  Place a towel between your skin and the bag.  Leave the ice on for 20 minutes, 2-3 times per day.  Use a heating pad or hot shower to apply heat to the head and neck area as told by your doctor.  Keep lights dim if bright lights bother you or make your headaches worse. Eating and Drinking  Eat meals on a regular schedule.  Lessen how much alcohol you drink.  Lessen how much caffeine you drink, or stop drinking caffeine. General Instructions  Keep all follow-up visits as told by your doctor. This is important.  Keep a journal to find out if certain things bring on headaches. For example, write down:  What you eat and drink.  How much sleep you get.  Any change to your diet or medicines.  Relax by getting a massage or doing other relaxing activities.  Lessen stress.  Sit up straight. Do not tighten (tense) your muscles.  Do not use tobacco products. This includes cigarettes, chewing tobacco, or e-cigarettes. If you need help quitting, ask your doctor.  Exercise  regularly as told by your doctor.  Get enough sleep. This often means 7-9 hours of sleep. GET HELP IF:  Your symptoms are not helped by medicine.  You have a headache that feels different than the other headaches.  You feel sick to your stomach (nauseous) or you throw up (vomit).  You have a fever. GET HELP RIGHT AWAY IF:   Your headache becomes really bad.  You keep throwing up.  You have a stiff neck.  You have trouble seeing.  You have trouble speaking.  You have pain in the eye or ear.  Your muscles are weak or you lose muscle control.  You lose your balance or have trouble walking.  You feel like you will pass out (faint) or you pass out.  You have confusion.   This information is not intended to replace advice given to you by your health care provider. Make sure you discuss any questions you have with your health care provider.   Document Released: 12/07/2007 Document Revised: 11/18/2014 Document Reviewed: 06/22/2014 Elsevier Interactive Patient Education [employer].

## 2015-03-09 LAB — CSF CULTURE W GRAM STAIN: Culture: NO GROWTH

## 2015-03-09 LAB — CSF CULTURE

## 2015-04-05 ENCOUNTER — Encounter: Payer: Self-pay | Admitting: Pain Medicine

## 2015-04-05 ENCOUNTER — Ambulatory Visit: Payer: Medicare Other | Attending: Pain Medicine | Admitting: Pain Medicine

## 2015-04-05 ENCOUNTER — Other Ambulatory Visit: Payer: Self-pay | Admitting: Pain Medicine

## 2015-04-05 VITALS — BP 133/98 | HR 68 | Temp 98.6°F | Resp 18 | Ht 64.0 in | Wt 140.0 lb

## 2015-04-05 DIAGNOSIS — J45909 Unspecified asthma, uncomplicated: Secondary | ICD-10-CM | POA: Insufficient documentation

## 2015-04-05 DIAGNOSIS — F1190 Opioid use, unspecified, uncomplicated: Secondary | ICD-10-CM | POA: Diagnosis not present

## 2015-04-05 DIAGNOSIS — H538 Other visual disturbances: Secondary | ICD-10-CM | POA: Diagnosis not present

## 2015-04-05 DIAGNOSIS — Z79891 Long term (current) use of opiate analgesic: Secondary | ICD-10-CM | POA: Diagnosis not present

## 2015-04-05 DIAGNOSIS — Z9049 Acquired absence of other specified parts of digestive tract: Secondary | ICD-10-CM | POA: Diagnosis not present

## 2015-04-05 DIAGNOSIS — K589 Irritable bowel syndrome without diarrhea: Secondary | ICD-10-CM | POA: Insufficient documentation

## 2015-04-05 DIAGNOSIS — R51 Headache: Secondary | ICD-10-CM | POA: Insufficient documentation

## 2015-04-05 DIAGNOSIS — G8929 Other chronic pain: Secondary | ICD-10-CM | POA: Diagnosis not present

## 2015-04-05 DIAGNOSIS — H5500 Unspecified nystagmus: Secondary | ICD-10-CM | POA: Diagnosis not present

## 2015-04-05 DIAGNOSIS — M545 Low back pain: Secondary | ICD-10-CM | POA: Diagnosis not present

## 2015-04-05 DIAGNOSIS — Z79899 Other long term (current) drug therapy: Secondary | ICD-10-CM | POA: Diagnosis not present

## 2015-04-05 DIAGNOSIS — M25559 Pain in unspecified hip: Secondary | ICD-10-CM | POA: Diagnosis present

## 2015-04-05 DIAGNOSIS — M961 Postlaminectomy syndrome, not elsewhere classified: Secondary | ICD-10-CM

## 2015-04-05 DIAGNOSIS — F418 Other specified anxiety disorders: Secondary | ICD-10-CM | POA: Diagnosis not present

## 2015-04-05 DIAGNOSIS — D6489 Other specified anemias: Secondary | ICD-10-CM | POA: Diagnosis not present

## 2015-04-05 DIAGNOSIS — M542 Cervicalgia: Secondary | ICD-10-CM | POA: Diagnosis not present

## 2015-04-05 DIAGNOSIS — M1611 Unilateral primary osteoarthritis, right hip: Secondary | ICD-10-CM | POA: Insufficient documentation

## 2015-04-05 DIAGNOSIS — M539 Dorsopathy, unspecified: Secondary | ICD-10-CM | POA: Diagnosis not present

## 2015-04-05 DIAGNOSIS — E039 Hypothyroidism, unspecified: Secondary | ICD-10-CM | POA: Insufficient documentation

## 2015-04-05 DIAGNOSIS — Z5181 Encounter for therapeutic drug level monitoring: Secondary | ICD-10-CM | POA: Diagnosis not present

## 2015-04-05 DIAGNOSIS — M549 Dorsalgia, unspecified: Secondary | ICD-10-CM | POA: Diagnosis present

## 2015-04-05 LAB — COMPREHENSIVE METABOLIC PANEL
ALK PHOS: 120 U/L (ref 38–126)
ALT: 12 U/L — AB (ref 14–54)
AST: 18 U/L (ref 15–41)
Albumin: 4.4 g/dL (ref 3.5–5.0)
Anion gap: 13 (ref 5–15)
BILIRUBIN TOTAL: 1 mg/dL (ref 0.3–1.2)
BUN: 6 mg/dL (ref 6–20)
CALCIUM: 9.9 mg/dL (ref 8.9–10.3)
CO2: 21 mmol/L — ABNORMAL LOW (ref 22–32)
CREATININE: 0.73 mg/dL (ref 0.44–1.00)
Chloride: 107 mmol/L (ref 101–111)
GFR calc Af Amer: >60 mL/min (ref >60)
Glucose, Bld: 112 mg/dL — ABNORMAL HIGH (ref 65–99)
Potassium: 3.9 mmol/L (ref 3.5–5.1)
Sodium: 141 mmol/L (ref 135–145)
TOTAL PROTEIN: 8.2 g/dL — AB (ref 6.5–8.1)

## 2015-04-05 LAB — MAGNESIUM: Magnesium: 2.1 mg/dL (ref 1.7–2.4)

## 2015-04-05 LAB — SEDIMENTATION RATE: Sed Rate: 37 mm/hr — ABNORMAL HIGH (ref 0–30)

## 2015-04-05 LAB — C-REACTIVE PROTEIN: CRP: 0.5 mg/dL (ref <1.0)

## 2015-04-05 MED ORDER — OXYCODONE HCL 10 MG PO TABS: 10.0000 mg | ORAL_TABLET | Freq: Four times a day (QID) | ORAL | Status: DC | PRN

## 2015-04-05 NOTE — Patient Instructions (Signed)
Instructed to get labwork done at Pre admit testing today.

## 2015-04-05 NOTE — Progress Notes (Signed)
Safety precautions to be maintained throughout the outpatient stay will include: orient to surroundings, keep bed in low position, maintain call bell within reach at all times, provide assistance with transfer out of bed and ambulation.  Pt came to ER 03/06/15 for bad headache- was given Fent.  0 pills remain oxycodone 10mg    Filled 03/05/15

## 2015-04-05 NOTE — Progress Notes (Signed)
Patient's Name: [name] MRN: [id number] DOB: 25-Apr-1960 DOS: 04/05/2015  Primary Reason(s) for Visit: Encounter for Medication Management CC: Hip Pain and Back Pain   HPI:  [name] is a 55 y.o. year old, female patient, who returns today as an established patient. She has Right hip pain; Right hip osteoarthritis; Postoperative anemia due to acute blood loss; Postop Hyponatremia; Chronic neck pain; Orthostatic hypotension; Dizziness; Anxiety state; Asthma, mild; Blurred vision; Benign paroxysmal positional nystagmus; Cervical disc disease; Congenital renal agenesis and dysgenesis; Narrowing of intervertebral disc space; Clinical depression; Colon, diverticulosis; Duodenal ulcer with hemorrhage and perforation ([place]); Fatigue; Acid reflux; Cephalalgia; Right hip arthralgia; H/O arthrodesis; Cannot sleep; IBS (irritable bowel syndrome); Lumbar disc disorder with myelopathy; Awareness of heartbeats; Addison anemia; RAD (reactive airway disease); Apnea, sleep; Esophagitis, reflux; DOE (dyspnea on exertion); Chronic left shoulder pain; Chronic right hip pain; Encounter for therapeutic drug level monitoring; Long term current use of opiate analgesic; Long term prescription opiate use; Uncomplicated opioid dependence ([place]); Opiate use; Chronic pain syndrome; Chronic pain; Steroid intolerance; Opioid dependence ([place]); Cervical spine pain; Cervical spondylosis; Cervical facet arthropathy (bilateral); History of cervical spinal surgery; Chronic pain of both shoulders; Failed back surgical syndrome; Epidural fibrosis; Lumbar spondylosis; Cervical facet syndrome (bilateral); Chronic right sacroiliac joint pain; and Chronic low back pain on her problem list.. Her primarily concern today is the Hip Pain and Back Pain   The patient returns today for pharmacological management of her chronic pain. She indicates that she is doing well on her current regimen. Unfortunately she has had other medical problems. She  has had pneumonia a couple times since the last time that we saw her. She has also been found to have hypothyroidism. She was wondering if opioids could cause this. I went over all of the possible endocrine effects of opioids with the patient.  Reported Pain Score: 6 , clinically she looks like a 1/10. Reported level is inconsistent with clinical obrservations. Pain Type: Chronic pain Pain Location: Back Pain Orientation: Upper Pain Descriptors / Indicators: Sharp ([place]) Pain Frequency: Constant  Date of Last Visit: 01/04/15 Service Provided on Last Visit: Evaluation  Pharmacotherapy  Review:   Onset of action: Within expected pharmacological parameters Time to Peak effect: Timing and results are as within normal expected parameters Effectiveness: Described as relatively effective, allowing for increase in activities of daily living (ADL) % Relief: More than 50% Side-effects or Adverse reactions: None reported Duration of action: Within normal limits for medication [place] PMP: Compliant with practice rules and regulations UDS Results: Compliant UDS Interpretation: Patient appears to be compliant with practice rules and regulations Medication Assessment Form: Reviewed. Patient indicates being compliant with therapy Treatment compliance: Compliant Substance Use Disorder (SUD) Risk Level: Low Pharmacologic Plan: Continue therapy as is  Lab Work: Illicit Drugs No results found for: THCU, COCAINSCRNUR, PCPSCRNUR, MDMA, AMPHETMU, METHADONE, ETOH  Inflammation Markers Lab Results  Component Value Date   ESRSEDRATE 10 02/26/2013    Renal Function Lab Results  Component Value Date   BUN 9 03/06/2015   CREATININE 0.67 03/06/2015   GFRAA >60 03/06/2015   GFRNONAA >60 03/06/2015    Hepatic Function Lab Results  Component Value Date   AST 20 03/06/2015   ALT 10* 03/06/2015   ALBUMIN 4.1 03/06/2015    Electrolytes Lab Results  Component Value Date   NA 135 03/06/2015   K 3.6  03/06/2015   CL 101 03/06/2015   CALCIUM 9.3 03/06/2015   MG 1.8 02/26/2013  Allergies:  [name] is allergic to amoxicillin; clindamycin; codeine; sulfa antibiotics; shellfish allergy; decadron; papaya derivatives; prednisone; betadine; chlorhexidine; clarithromycin; clindamycin/lincomycin; and other.  Meds:  The patient has a current medication list which includes the following prescription(s): albuterol, alprazolam, bupropion, fluticasone, fluticasone-salmeterol, linzess, loratadine, metoprolol succinate, montelukast, omeprazole, ondansetron, oxycodone hcl, aerochamber mv, valacyclovir, oxycodone hcl, and oxycodone hcl.  Current Outpatient Prescriptions on File Prior to Visit  Medication Sig  . albuterol (PROVENTIL HFA;VENTOLIN HFA) 108 (90 BASE) MCG/ACT inhaler Inhale 2 puffs into the lungs every 6 (six) hours as needed for wheezing.  Marland Kitchen ALPRAZolam (XANAX) 1 MG tablet TAKE 1/2 A TABLET BY MOUTH EVERY MORNING, TAKE 1/2 A TABLET AT LUNCH AND 2 TABLETS AT BEDTIME  . buPROPion (WELLBUTRIN SR) 150 MG 12 hr tablet TAKE 1 TABLET BY MOUTH TWICE A DAY  . fluticasone (FLONASE) 50 MCG/ACT nasal spray Place 2 sprays into the nose daily.  . Fluticasone-Salmeterol (ADVAIR DISKUS) 500-50 MCG/DOSE AEPB Inhale 1 puff into the lungs 2 (two) times daily.  Marland Kitchen LINZESS 290 MCG CAPS capsule TAKE ONE CAPSULE BY MOUTH EVERY DAY  . loratadine (CLARITIN REDITABS) 10 MG dissolvable tablet Take 1 tablet by mouth daily.  . metoprolol succinate (TOPROL-XL) 50 MG 24 hr tablet TAKE 1 TABLET BY MOUTH EVERY DAY  . montelukast (SINGULAIR) 10 MG tablet TAKE 1 TABLET BY MOUTH EVERY DAY  . omeprazole (PRILOSEC) 20 MG capsule TAKE ONE CAPSULE BY MOUTH TWICE A DAY  . ondansetron (ZOFRAN ODT) 8 MG disintegrating tablet Take 1 tablet (8 mg total) by mouth every 8 (eight) hours as needed for nausea or vomiting.  Marland Kitchen Spacer/Aero-Holding Chambers (AEROCHAMBER MV) inhaler by Does not apply route.  . valACYclovir (VALTREX) 1000 MG  tablet TAKE TWO TABLETS BY MOUTH TWICE DAILY FOR ONE DAY FEVER BLISTER   No current facility-administered medications on file prior to visit.    ROS:  Constitutional: Afebrile, no chills, well hydrated and well nourished Gastrointestinal: negative Musculoskeletal:negative Neurological: negative Behavioral/Psych: negative  PFSH:  Medical:  [name]  has a past medical history of Headache(784.0); PONV (postoperative nausea and vomiting); Abnormal EKG; Hypertension; Asthma; GERD (gastroesophageal reflux disease); DDD (degenerative disc disease); Pain; Anemia; History of blood transfusion; Inverted T wave; Pneumonia; History of kidney stones; Right foot drop; Anxiety; Depression; History of cervical spinal surgery (01/04/2015); and Chronic headaches. Family: family history includes Aneurysm in her maternal grandmother and mother; Breast cancer (age of onset: 22) in her paternal grandmother. Surgical:  has past surgical history that includes RIGHT HIP ARTHROSCOPY FOR LABRAL TEAR (ABOUT 2010); Anterior fusion cervical spine (MAY 2012); Nasal septum surgery (MARCH 2013); Back surgery (2009); Tonsillectomy; Abdominal hysterectomy; DIAGNOSTIC LAPAROSCOPIES - MULTIPLE FOR ENDOMETRIOSIS; Cholecystectomy; Hip arthroscopy (09/20/2011); Shoulder arthroscopy (05-06-12); Carpal tunnel release (05-06-12); Total hip arthroplasty (Right, 05/13/2012); Anterior cervical decomp/discectomy fusion (N/A, 10/30/2012); Posterior cervical fusion/foraminotomy (N/A, 04/16/2013); Breast biopsy (Right, 2008); and Breast excisional biopsy (Left, 1998). Tobacco:  reports that she has never smoked. She has never used smokeless tobacco. Alcohol:  reports that she does not drink alcohol. Drug:  reports that she does not use illicit drugs.  Physical Exam:  Vitals:  Today's Vitals   04/05/15 0944 04/05/15 0947  BP: 133/98   Pulse: 68   Temp: 98.6 F (37 C)   TempSrc: Oral   Resp: 18   Height: 5\' 4"  (1.626 m)   Weight: 140 lb  (63.504 kg)   SpO2: 100%   PainSc: 6  6     Calculated BMI: Body mass  index is 24.02 kg/(m^2).  General appearance: alert, cooperative, appears stated age and no distress Eyes: PERLA Respiratory: No evidence respiratory distress, no audible rales or ronchi and no use of accessory muscles of respiration  Cervical Spine Inspection: Normal anatomy Alignment: Symetrical ROM: Adequate  Upper Extremities Inspection: No gross anomalies detected ROM: Adequate Sensory: Normal Motor: Unremarkable  Thoracic Spine Inspection: No gross anomalies detected Alignment: Symetrical ROM: Adequate  Lumbar Spine Inspection: No gross anomalies detected Alignment: Symetrical Palpation: WNL ROM: Adequate Gait: WNL  Lower Extremities Inspection: No gross anomalies detected ROM: Adequate Sensory: Normal Motor: Unremarkable  Assessment & Plan:  Primary Diagnosis & Pertinent Problem List: The primary encounter diagnosis was Chronic pain. Diagnoses of Long term current use of opiate analgesic, Chronic neck pain, Failed back surgical syndrome, and Chronic low back pain were also pertinent to this visit.  Visit Diagnosis: 1. Chronic pain   2. Long term current use of opiate analgesic   3. Chronic neck pain   4. Failed back surgical syndrome   5. Chronic low back pain     Assessment: No problem-specific assessment & plan notes found for this encounter.   Pharmacotherapy Orders: Meds ordered this encounter  Medications  . Oxycodone HCl 10 MG TABS    Sig: Take 1 tablet (10 mg total) by mouth every 6 (six) hours as needed.    Dispense:  120 tablet    Refill:  0    Do not place this medication, or any other prescription from our practice, on "Automatic Refill". Patient may have prescription filled one day early if pharmacy is closed on scheduled refill date. Do not fill until: 04/05/15 To last until: 05/05/15  . Oxycodone HCl 10 MG TABS    Sig: Take 1 tablet (10 mg total) by mouth every  6 (six) hours as needed.    Dispense:  120 tablet    Refill:  0    Do not place this medication, or any other prescription from our practice, on "Automatic Refill". Patient may have prescription filled one day early if pharmacy is closed on scheduled refill date. Do not fill until: 05/05/15 To last until: 06/01/15  . Oxycodone HCl 10 MG TABS    Sig: Take 1 tablet (10 mg total) by mouth every 6 (six) hours as needed.    Dispense:  120 tablet    Refill:  0    Do not place this medication, or any other prescription from our practice, on "Automatic Refill". Patient may have prescription filled one day early if pharmacy is closed on scheduled refill date. Do not fill until: 06/01/15 To last until: 07/01/15    [facility] & Procedure Orders: Orders Placed This Encounter  Procedures  . Drugs of abuse screen w/o alc, rtn urine-sln    Volume: 10 ml(s). Minimum 3 ml of urine is needed. Document temperature of fresh sample. Indications: Long term (current) use of opiate analgesic (Z79.891) Test#: [id number] ([employer]ox[employer] Select-13)  . Comprehensive metabolic panel    Order Specific Question:  Has the patient fasted?    Answer:  No  . C-reactive protein  . Magnesium  . Sedimentation rate  . Vitamin B12    Indication: Bone Pain (M89.9)  . Vitamin D pnl(25-hydrxy+1,25-dihy)-bld    Radiology Orders: None  Interventional Therapies: None at this time.    Administered Medications: [name] had no medications administered during this visit.  Primary Care Physician: Margarita Rana, MD Location: [facility] Facility Note by: Kathlen Brunswick. Dossie Arbour, M.D,  DABA, DABAPM, DABPM, DABIPP, FIPP

## 2015-04-06 ENCOUNTER — Encounter: Payer: Self-pay | Admitting: Emergency Medicine

## 2015-04-06 ENCOUNTER — Emergency Department
Admission: EM | Admit: 2015-04-06 | Discharge: 2015-04-06 | Disposition: A | Discharge status: Home / Self Care | Payer: Medicare Other | Attending: Emergency Medicine | Admitting: Emergency Medicine

## 2015-04-06 DIAGNOSIS — Z880 Allergy status to penicillin: Secondary | ICD-10-CM | POA: Insufficient documentation

## 2015-04-06 DIAGNOSIS — Z79899 Other long term (current) drug therapy: Secondary | ICD-10-CM | POA: Insufficient documentation

## 2015-04-06 DIAGNOSIS — Z7951 Long term (current) use of inhaled steroids: Secondary | ICD-10-CM | POA: Insufficient documentation

## 2015-04-06 DIAGNOSIS — I10 Essential (primary) hypertension: Secondary | ICD-10-CM | POA: Insufficient documentation

## 2015-04-06 DIAGNOSIS — R1110 Vomiting, unspecified: Secondary | ICD-10-CM | POA: Diagnosis present

## 2015-04-06 DIAGNOSIS — R112 Nausea with vomiting, unspecified: Secondary | ICD-10-CM | POA: Insufficient documentation

## 2015-04-06 LAB — COMPREHENSIVE METABOLIC PANEL
ALBUMIN: 4.7 g/dL (ref 3.5–5.0)
ALT: 13 U/L — ABNORMAL LOW (ref 14–54)
ANION GAP: 9 (ref 5–15)
AST: 19 U/L (ref 15–41)
Alkaline Phosphatase: 135 U/L — ABNORMAL HIGH (ref 38–126)
BILIRUBIN TOTAL: 1.3 mg/dL — AB (ref 0.3–1.2)
BUN: 11 mg/dL (ref 6–20)
CO2: 25 mmol/L (ref 22–32)
Calcium: 10 mg/dL (ref 8.9–10.3)
Chloride: 103 mmol/L (ref 101–111)
Creatinine, Ser: 0.78 mg/dL (ref 0.44–1.00)
GFR calc Af Amer: >60 mL/min (ref >60)
Glucose, Bld: 113 mg/dL — ABNORMAL HIGH (ref 65–99)
POTASSIUM: 3.7 mmol/L (ref 3.5–5.1)
Sodium: 137 mmol/L (ref 135–145)
TOTAL PROTEIN: 8.5 g/dL — AB (ref 6.5–8.1)

## 2015-04-06 LAB — URINALYSIS COMPLETE WITH MICROSCOPIC (ARMC ONLY)
BACTERIA UA: NONE SEEN
BILIRUBIN URINE: NEGATIVE
Glucose, UA: NEGATIVE mg/dL
Leukocytes, UA: NEGATIVE
Nitrite: NEGATIVE
PROTEIN: NEGATIVE mg/dL
Specific Gravity, Urine: 1.019 (ref 1.005–1.030)
Squamous Epithelial / LPF: NONE SEEN
pH: 6 (ref 5.0–8.0)

## 2015-04-06 LAB — CBC
HCT: 43 % (ref 35.0–47.0)
Hemoglobin: 14.2 g/dL (ref 12.0–16.0)
MCH: 28.1 pg (ref 26.0–34.0)
MCHC: 33.1 g/dL (ref 32.0–36.0)
MCV: 85 fL (ref 80.0–100.0)
Platelets: 409 10*3/uL (ref 150–440)
RBC: 5.06 MIL/uL (ref 3.80–5.20)
RDW: 14.1 % (ref 11.5–14.5)
WBC: 9.4 10*3/uL (ref 3.6–11.0)

## 2015-04-06 LAB — LIPASE, BLOOD: Lipase: 23 U/L (ref 11–51)

## 2015-04-06 MED ORDER — SODIUM CHLORIDE 0.9 % IV BOLUS (SEPSIS)
1000.0000 mL | Freq: Once | INTRAVENOUS | Status: AC
  Administered 2015-04-06: 1000 mL via INTRAVENOUS

## 2015-04-06 MED ORDER — ONDANSETRON 4 MG PO TBDP
4.0000 mg | ORAL_TABLET | Freq: Once | ORAL | Status: AC | PRN
  Administered 2015-04-06: 4 mg via ORAL

## 2015-04-06 MED ORDER — PROMETHAZINE HCL 25 MG RE SUPP: 25.0000 mg | Freq: Four times a day (QID) | RECTAL | Status: DC | PRN

## 2015-04-06 MED ORDER — ONDANSETRON 4 MG PO TBDP
ORAL_TABLET | ORAL | Status: AC
  Filled 2015-04-06: qty 1

## 2015-04-06 MED ORDER — ONDANSETRON HCL 4 MG PO TABS: 4.0000 mg | ORAL_TABLET | Freq: Three times a day (TID) | ORAL | Status: DC | PRN

## 2015-04-06 MED ORDER — IBUPROFEN 400 MG PO TABS
600.0000 mg | ORAL_TABLET | Freq: Once | ORAL | Status: AC
  Administered 2015-04-06: 600 mg via ORAL
  Filled 2015-04-06: qty 2

## 2015-04-06 MED ORDER — ONDANSETRON HCL 4 MG/2ML IJ SOLN
4.0000 mg | Freq: Once | INTRAMUSCULAR | Status: AC
  Administered 2015-04-06: 4 mg via INTRAVENOUS
  Filled 2015-04-06: qty 2

## 2015-04-06 NOTE — ED Notes (Signed)
Vomiting.  Onset of symptoms 1500 yesterday.  States vomited all night, unable to tolerate anything PO.  C/o Headache.

## 2015-04-06 NOTE — ED Notes (Signed)
Pt given fluids for PO challenge per MD VORB.  Pt tolerating fluids and denies nausea.

## 2015-04-06 NOTE — Discharge Instructions (Signed)
Please seek medical attention for any high fevers, chest pain, shortness of breath, change in behavior, persistent vomiting, bloody stool or any other new or concerning symptoms. ° ° °Nausea and Vomiting °Nausea is a sick feeling that often comes before throwing up (vomiting). Vomiting is a reflex where stomach contents come out of your mouth. Vomiting can cause severe loss of body fluids (dehydration). Children and elderly adults can become dehydrated quickly, especially if they also have diarrhea. Nausea and vomiting are symptoms of a condition or disease. It is important to find the cause of your symptoms. °CAUSES  °· Direct irritation of the stomach lining. This irritation can result from increased acid production (gastroesophageal reflux disease), infection, food poisoning, taking certain medicines (such as nonsteroidal anti-inflammatory drugs), alcohol use, or tobacco use. °· Signals from the brain. These signals could be caused by a headache, heat exposure, an inner ear disturbance, increased pressure in the brain from injury, infection, a tumor, or a concussion, pain, emotional stimulus, or metabolic problems. °· An obstruction in the gastrointestinal tract (bowel obstruction). °· Illnesses such as diabetes, hepatitis, gallbladder problems, appendicitis, kidney problems, cancer, sepsis, atypical symptoms of a heart attack, or eating disorders. °· Medical treatments such as chemotherapy and radiation. °· Receiving medicine that makes you sleep (general anesthetic) during surgery. °DIAGNOSIS °Your caregiver may ask for tests to be done if the problems do not improve after a few days. Tests may also be done if symptoms are severe or if the reason for the nausea and vomiting is not clear. Tests may include: °· Urine tests. °· Blood tests. °· Stool tests. °· Cultures (to look for evidence of infection). °· X-rays or other imaging studies. °Test results can help your caregiver make decisions about treatment or the  need for additional tests. °TREATMENT °You need to stay well hydrated. Drink frequently but in small amounts. You may wish to drink water, sports drinks, clear broth, or eat frozen ice pops or gelatin dessert to help stay hydrated. When you eat, eating slowly may help prevent nausea. There are also some antinausea medicines that may help prevent nausea. °HOME CARE INSTRUCTIONS  °· Take all medicine as directed by your caregiver. °· If you do not have an appetite, do not force yourself to eat. However, you must continue to drink fluids. °· If you have an appetite, eat a normal diet unless your caregiver tells you differently. °¨ Eat a variety of complex carbohydrates (rice, wheat, potatoes, bread), lean meats, yogurt, fruits, and vegetables. °¨ Avoid high-fat foods because they are more difficult to digest. °· Drink enough water and fluids to keep your urine clear or pale yellow. °· If you are dehydrated, ask your caregiver for specific rehydration instructions. Signs of dehydration may include: °¨ Severe thirst. °¨ Dry lips and mouth. °¨ Dizziness. °¨ Dark urine. °¨ Decreasing urine frequency and amount. °¨ Confusion. °¨ Rapid breathing or pulse. °SEEK IMMEDIATE MEDICAL CARE IF:  °· You have blood or brown flecks (like coffee grounds) in your vomit. °· You have black or bloody stools. °· You have a severe headache or stiff neck. °· You are confused. °· You have severe abdominal pain. °· You have chest pain or trouble breathing. °· You do not urinate at least once every 8 hours. °· You develop cold or clammy skin. °· You continue to vomit for longer than 24 to 48 hours. °· You have a fever. °MAKE SURE YOU:  °· Understand these instructions. °· Will watch your condition. °· Will get help right away   if you are not doing well or get worse. °  °This information is not intended to replace advice given to you by your health care provider. Make sure you discuss any questions you have with your health care provider. °    °Document Released: 02/27/2005 Document Revised: 05/22/2011 Document Reviewed: 07/27/2010 °[employer]vier Interactive Patient Education ©2016 [employer] ° °

## 2015-04-06 NOTE — ED Provider Notes (Signed)
[facility] Emergency Department Provider Note   ____________________________________________  Time seen: 58  I have reviewed the triage vital signs and the nursing notes.   HISTORY  Chief Complaint Emesis   History limited by: Not Limited   HPI [name] is a 56 y.o. female who presents to the emergency department today because of concerns for emesis. She states that it started a little over 24 hours ago.It has since been constant. She states that the emesis has been frequent. She states that initially it was phlegm and then bile and now she is only dry heaving. She states she has not been able to hold any food down. She denies any abdominal pain. She denies any diarrhea. She denies any fevers.    Past Medical History  Diagnosis Date  . Headache(784.0)     AND NECK PAIN--STATES RECENT TEST SHOW CERVICAL DEGENERATION  . PONV (postoperative nausea and vomiting)     PT GIVES HX OF N&V AND FEVER WITH SURGERIES YEARS AGO--BUT NO PROBLEMS WITH MORE RECENT SURGERIES--STATES NOT MALIGNANT HYPERTHERMIA  . 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 Hypertension   . Asthma     INHALERS WITH FLARE -UPS  . GERD (gastroesophageal reflux disease)   . DDD (degenerative disc disease)     CERVICAL AND LUMBAR-CHRONIC PAIN, RT HIP LABRAL TEAR  . Pain     CHRONIC NECK AND BACK PAIN - LIMITED ROM NECK - S/P FUSIONS CERVICAL AND LUMBAR  . Anemia     Iron Infusion-8 yrs ago  . History of blood transfusion     s/p back surgery  . Inverted T wave   . Pneumonia   . History of kidney stones   . Right foot drop   . Anxiety   . Depression     PT STATES A LOT OF STRESS IN HER LIFE  . History of cervical spinal surgery 01/04/2015  . Chronic headaches     Patient Active Problem List   Diagnosis Date Noted  . Chronic low back pain 04/05/2015  . Chronic left shoulder pain  01/04/2015  . Chronic right hip pain 01/04/2015  . Encounter for therapeutic drug level monitoring 01/04/2015  . Long term current use of opiate analgesic 01/04/2015  . Long term prescription opiate use 01/04/2015  . Uncomplicated opioid dependence ([place]) 01/04/2015  . Opiate use 01/04/2015  . Chronic pain syndrome 01/04/2015  . Chronic pain 01/04/2015  . Steroid intolerance 01/04/2015  . Opioid dependence ([place]) 01/04/2015  . Cervical spine pain 01/04/2015  . Cervical spondylosis 01/04/2015  . Cervical facet arthropathy (bilateral) 01/04/2015  . History of cervical spinal surgery 01/04/2015  . Chronic pain of both shoulders 01/04/2015  . 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 Lumbar spondylosis 01/04/2015  . Cervical facet syndrome (bilateral) 01/04/2015  . Chronic right sacroiliac joint pain 01/04/2015  . DOE (dyspnea on exertion) 09/03/2014  . Asthma, mild 08/18/2014  . Blurred vision 08/18/2014  . Benign paroxysmal positional nystagmus 08/18/2014  . Cervical disc disease 08/18/2014  . Narrowing of intervertebral disc space 08/18/2014  . Clinical depression 08/18/2014  . Fatigue 08/18/2014  . Acid reflux 08/18/2014  . Cephalalgia 08/18/2014  . Right hip arthralgia 08/18/2014  . H/O arthrodesis 08/18/2014  . Cannot sleep 08/18/2014  . Awareness of heartbeats 08/18/2014  . RAD (reactive airway disease) 08/18/2014  .  Apnea, sleep 08/18/2014  . Orthostatic hypotension 04/22/2013  . Dizziness 04/22/2013  . Chronic neck pain 04/16/2013  . Postoperative anemia due to acute blood loss 05/14/2012  . Postop Hyponatremia 05/14/2012  . Right hip osteoarthritis 05/13/2012  . Right hip pain 09/20/2011  . Lumbar disc disorder with myelopathy 09/16/2007  . 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 Anxiety state 08/03/2003  . Colon, diverticulosis 07/15/2003  . Duodenal ulcer with hemorrhage and perforation ([place]) 04/27/2003  . IBS (irritable bowel syndrome)  04/27/2003  . Esophagitis, reflux 02/11/2003  . 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 Right hip arthroscopy for labral tear  ABOUT 2010    2012 also  . Anterior fusion cervical spine  MAY 2012    AT [facility]  . Nasal septum surgery  MARCH 2013    IN [place]  . Back surgery  2009    LUMBAR FUSION WITH RODS   . Tonsillectomy      AS A CHILD  . Abdominal hysterectomy    . Diagnostic laparoscopies - multiple for endometriosis    . Cholecystectomy    . Hip arthroscopy  09/20/2011    Procedure: ARTHROSCOPY HIP;  Surgeon: Gearlean Alf, MD;  Location: [facility];  Service: Orthopedics;  Laterality: Right;  Right Hip Scope with Labral Debridement  . Shoulder arthroscopy  05-06-12    bone spur  . Carpal tunnel release  05-06-12    Right  . Total hip arthroplasty Right 05/13/2012    Procedure: TOTAL HIP ARTHROPLASTY ANTERIOR APPROACH;  Surgeon: Gearlean Alf, MD;  Location: [facility];  Service: Orthopedics;  Laterality: Right;  . Anterior cervical decomp/discectomy fusion N/A 10/30/2012    Procedure: ACDF C5-6, EXPLORATION AND HARDWARE REMOVAL C6-7;  Surgeon: Melina Schools, MD;  Location: [facility];  Service: Orthopedics;  Laterality: N/A;  . Posterior cervical fusion/foraminotomy N/A 04/16/2013    Procedure: REMOVAL CERVICAL PLATES AND INTERBODY CAGE/POSTERIOR CERVICAL SPINAL FUSION C4 - C6/C5 CORPECTOMY/C4 - C6 FUSION WITH ILIAC CREST BONE GRAFT;  Surgeon: Melina Schools, MD;  Location: [facility];  Service: Orthopedics;  Laterality: N/A;  . Breast biopsy Right 2008    benign  . Breast excisional biopsy Left 1998    benign    Current Outpatient Rx  Name  Route  Sig  Dispense  Refill  . albuterol (PROVENTIL HFA;VENTOLIN HFA) 108 (90 BASE) MCG/ACT inhaler   Inhalation   Inhale 2 puffs into the lungs every 6 (six) hours as needed for wheezing.         Marland Kitchen ALPRAZolam (XANAX) 1 MG tablet      TAKE 1/2 A TABLET BY MOUTH EVERY MORNING,  TAKE 1/2 A TABLET AT LUNCH AND 2 TABLETS AT BEDTIME   90 tablet   5     Not to exceed 4 additional fills before 03/27/2015 ...   . buPROPion (WELLBUTRIN SR) 150 MG 12 hr tablet      TAKE 1 TABLET BY MOUTH TWICE A DAY   60 tablet   5   . fluticasone (FLONASE) 50 MCG/ACT nasal spray   Nasal   Place 2 sprays into the nose daily.         . Fluticasone-Salmeterol (ADVAIR DISKUS) 500-50 MCG/DOSE AEPB   Inhalation   Inhale 1 puff into the lungs 2 (two) times daily.   60 each   5   . LINZESS 290 MCG CAPS capsule      TAKE ONE CAPSULE BY  MOUTH EVERY DAY   30 capsule   2   . loratadine (CLARITIN REDITABS) 10 MG dissolvable tablet   Oral   Take 1 tablet by mouth daily.         . metoprolol succinate (TOPROL-XL) 50 MG 24 hr tablet      TAKE 1 TABLET BY MOUTH EVERY DAY   30 tablet   5   . montelukast (SINGULAIR) 10 MG tablet      TAKE 1 TABLET BY MOUTH EVERY DAY   30 tablet   5   . omeprazole (PRILOSEC) 20 MG capsule      TAKE ONE CAPSULE BY MOUTH TWICE A DAY   60 capsule   5   . ondansetron (ZOFRAN ODT) 8 MG disintegrating tablet   Oral   Take 1 tablet (8 mg total) by mouth every 8 (eight) hours as needed for nausea or vomiting.   20 tablet   0   . Oxycodone HCl 10 MG TABS   Oral   Take 1 tablet (10 mg total) by mouth every 6 (six) hours as needed.   120 tablet   0     Do not place this medication, or any other prescri ...   . Oxycodone HCl 10 MG TABS   Oral   Take 1 tablet (10 mg total) by mouth every 6 (six) hours as needed.   120 tablet   0     Do not place this medication, or any other prescri ...   . Oxycodone HCl 10 MG TABS   Oral   Take 1 tablet (10 mg total) by mouth every 6 (six) hours as needed.   120 tablet   0     Do not place this medication, or any other prescri ...   . Spacer/Aero-Holding Chambers (AEROCHAMBER MV) inhaler   Does not apply   by Does not apply route.         . valACYclovir (VALTREX) 1000 MG tablet      TAKE  TWO TABLETS BY MOUTH TWICE DAILY FOR ONE DAY FEVER BLISTER   4 tablet   5     Allergies Amoxicillin; Clindamycin; Codeine; Sulfa antibiotics; Shellfish allergy; Decadron; Papaya derivatives; Prednisone; Betadine; Chlorhexidine; Clarithromycin; Clindamycin/lincomycin; and Other  Family History  Problem Relation Age of Onset  . Aneurysm Mother   . Aneurysm Maternal Grandmother   . Breast cancer Paternal Grandmother 8    Social History Social History  Substance Use Topics  . Smoking status: Never Smoker   . Smokeless tobacco: Never Used  . Alcohol Use: No    Review of Systems  Constitutional: Negative for fever. Cardiovascular: Negative for chest pain. Respiratory: Negative for shortness of breath. Gastrointestinal: Negative for abdominal pain. Positive for vomiting. Negative for diarrhea. Neurological: Negative for headaches, focal weakness or numbness.   10-point ROS otherwise negative.  ____________________________________________   PHYSICAL EXAM:  VITAL SIGNS: ED Triage Vitals  Enc Vitals Group     BP 04/06/15 1816 148/94 mmHg     Pulse Rate 04/06/15 1816 64     Resp 04/06/15 1816 16     Temp 04/06/15 1816 98.1 F (36.7 C)     Temp Source 04/06/15 1816 Oral     SpO2 04/06/15 1816 96 %     Weight 04/06/15 1816 140 lb (63.504 kg)     Height 04/06/15 1816 5\' 4"  (1.626 m)     Head Cir --      Peak Flow --  Pain Score 04/06/15 1819 9   Constitutional: Alert and oriented. Well appearing and in no distress. Eyes: Conjunctivae are normal. PERRL. Normal extraocular movements. ENT   Head: Normocephalic and atraumatic.   Nose: No congestion/rhinnorhea.   Mouth/Throat: Mucous membranes are moist.   Neck: No stridor. Hematological/Lymphatic/Immunilogical: No cervical lymphadenopathy. Cardiovascular: Normal rate, regular rhythm.  No murmurs, rubs, or gallops. Respiratory: Normal respiratory effort without tachypnea nor retractions. Breath sounds  are clear and equal bilaterally. No wheezes/rales/rhonchi. Gastrointestinal: Soft and nontender. No distention.  Genitourinary: Deferred Musculoskeletal: Normal range of motion in all extremities. No joint effusions.  No lower extremity tenderness nor edema. Neurologic:  Normal speech and language. No gross focal neurologic deficits are appreciated.  Skin:  Skin is warm, dry and intact. No rash noted. Psychiatric: Mood and affect are normal. Speech and behavior are normal. Patient exhibits appropriate insight and judgment.  ____________________________________________    LABS (pertinent positives/negatives)  Labs Reviewed  COMPREHENSIVE METABOLIC PANEL - Abnormal; Notable for the following:    Glucose, Bld 113 (*)    Total Protein 8.5 (*)    ALT 13 (*)    Alkaline Phosphatase 135 (*)    Total Bilirubin 1.3 (*)    All other components within normal limits  URINALYSIS COMPLETEWITH MICROSCOPIC ([facility] ONLY) - Abnormal; Notable for the following:    Color, Urine YELLOW (*)    APPearance CLEAR (*)    Ketones, ur 2+ (*)    Hgb urine dipstick 1+ (*)    All other components within normal limits  LIPASE, BLOOD  CBC     ____________________________________________   EKG  None  ____________________________________________    RADIOLOGY  None  ____________________________________________   PROCEDURES  Procedure(s) performed: None  Critical Care performed: No  ____________________________________________   INITIAL IMPRESSION / ASSESSMENT AND PLAN / ED COURSE  Pertinent labs & imaging results that were available during my care of the patient were reviewed by me and considered in my medical decision making (see chart for details).  Patient presented to the emergency department today because of emesis. The patient blood work without any obvious etiology. Skull exam is benign. Patient was given IV fluids and antiemetics and was able to tolerate by mouth. Will discharge  home with antiemetics. Encouraged primary care follow-up.  ____________________________________________   FINAL CLINICAL IMPRESSION(S) / ED DIAGNOSES  Final diagnoses:  Non-intractable vomiting with nausea, vomiting of unspecified type     Nance Pear, MD 04/06/15 2207

## 2015-04-08 ENCOUNTER — Encounter: Payer: Medicare Other | Admitting: Family Medicine

## 2015-04-13 LAB — TOXASSURE SELECT 13 (MW), URINE: PDF: 0

## 2015-04-15 ENCOUNTER — Other Ambulatory Visit: Payer: Self-pay | Admitting: Family Medicine

## 2015-04-15 DIAGNOSIS — F411 Generalized anxiety disorder: Secondary | ICD-10-CM

## 2015-04-15 MED ORDER — ALPRAZOLAM 1 MG PO TABS: ORAL_TABLET | ORAL | Status: DC

## 2015-04-15 NOTE — Telephone Encounter (Signed)
Pt needs refill ALPRAZolam (XANAX) 1 MG tablet   She uses [facility] in [place]

## 2015-04-15 NOTE — Telephone Encounter (Signed)
Printed, please fax or call in to pharmacy. Thank you.   

## 2015-04-22 ENCOUNTER — Telehealth: Payer: Self-pay | Admitting: Family Medicine

## 2015-04-22 NOTE — Telephone Encounter (Signed)
Pt called from [facility].  There with her daughter while she is having surgery.  Pt has came down with congestion, cough.  She says it usually goes in to something worse.  She has her asthma medications.  She thinks she needs something for a cough.    The pharmacy she will use is [facility] [address] [place].  [phone]  Her call back is [phone]  Thanks Con Memos

## 2015-04-22 NOTE — Telephone Encounter (Signed)
LMTCB 04/22/2015  Thanks,   -[name]

## 2015-04-22 NOTE — Telephone Encounter (Signed)
Pt advised and she will try OTC cough medications -aa

## 2015-04-22 NOTE — Telephone Encounter (Signed)
Most cough medication is over the counter. Only thing I could send is [name]. Thanks.

## 2015-04-23 NOTE — Progress Notes (Signed)
Quick Note:  Most of the CO2 in the body is in the form of bicarbonate (HCO3-). Therefore, the CO2 blood test is really a measure of bicarbonate levels. kidneys help maintain the normal bicarbonate levels. The normal range for our lab is between 22 and 28 mEq/L. Low levels may suggest kidney disease and/or metabolic acidosis.  Normal fasting (NPO x 8 hours) glucose levels are between 65-99 mg/dl, with 2 hour fasting, levels are usually less than 140 mg/dl. Any random blood glucose level greater than 200 mg/dl is considered to be Diabetes.  Results of a total protein test are usually considered along with those from other tests of the CMP and will give the healthcare practitioner information on a person's general health status with regard to nutrition and/or conditions involving major organs, such as the kidney and liver. A high total protein level may be seen with chronic inflammation or infections such as viral hepatitis or HIV. It also may be associated with bone marrow disorders such as multiple myeloma. Possible causes of high blood protein include: Bone marrow disorder Multiple myeloma Amyloidosis Monoclonal gammopathy of undetermined significance (MGUS) Chronic inflammatory conditions HIV/AIDS Dehydration (which may make blood proteins appear falsely elevated)  A high-protein diet doesn't cause high blood protein.  High blood protein is not a specific disease or condition in itself. It's usually a laboratory finding uncovered during the evaluation of a particular condition or symptom. For instance, although high blood protein is found in people who are dehydrated, the real problem is that the blood plasma is actually more concentrated.  Certain proteins in the blood may be elevated as your body fights an infection or some other inflammation. People with certain bone marrow diseases, such as multiple myeloma, may have high blood protein levels before they show any other  symptoms.  ______ 

## 2015-04-23 NOTE — Progress Notes (Signed)
Quick Note:   A normal sedimentation rate should be below 30 mm/hr. The sed rate is an acute phase reactant that indirectly measures the degree of inflammation present in the body. It can be acute, developing rapidly after trauma, injury or infection, for example, or can occur over an extended time (chronic) with conditions such as autoimmune diseases or cancer. The ESR is not diagnostic; it is a non-specific, screening test that may be elevated in a number of these different conditions. It provides general information about the presence or absence of an inflammatory condition.  ______ 

## 2015-04-23 NOTE — Progress Notes (Signed)
Quick Note:  Lab results reviewed and found to be within normal limits. ______ 

## 2015-05-14 ENCOUNTER — Encounter: Payer: Medicare Other | Admitting: Family Medicine

## 2015-06-03 DIAGNOSIS — M25512 Pain in left shoulder: Secondary | ICD-10-CM | POA: Diagnosis not present

## 2015-06-03 DIAGNOSIS — M25511 Pain in right shoulder: Secondary | ICD-10-CM | POA: Diagnosis not present

## 2015-06-12 DIAGNOSIS — M25511 Pain in right shoulder: Secondary | ICD-10-CM | POA: Diagnosis not present

## 2015-06-12 DIAGNOSIS — G8929 Other chronic pain: Secondary | ICD-10-CM | POA: Diagnosis not present

## 2015-06-12 DIAGNOSIS — M25512 Pain in left shoulder: Secondary | ICD-10-CM | POA: Diagnosis not present

## 2015-06-16 DIAGNOSIS — M25512 Pain in left shoulder: Secondary | ICD-10-CM | POA: Diagnosis not present

## 2015-06-16 DIAGNOSIS — M25511 Pain in right shoulder: Secondary | ICD-10-CM | POA: Diagnosis not present

## 2015-06-28 ENCOUNTER — Encounter: Payer: Medicare Other | Admitting: Pain Medicine

## 2015-06-30 ENCOUNTER — Encounter: Payer: Medicare Other | Admitting: Pain Medicine

## 2015-07-02 ENCOUNTER — Encounter: Payer: Self-pay | Admitting: Pain Medicine

## 2015-07-02 ENCOUNTER — Ambulatory Visit: Payer: Medicare Other | Attending: Pain Medicine | Admitting: Pain Medicine

## 2015-07-02 VITALS — BP 134/87 | HR 63 | Temp 98.2°F | Resp 16 | Ht 64.0 in | Wt 155.0 lb

## 2015-07-02 DIAGNOSIS — G8929 Other chronic pain: Secondary | ICD-10-CM | POA: Diagnosis not present

## 2015-07-02 DIAGNOSIS — K5790 Diverticulosis of intestine, part unspecified, without perforation or abscess without bleeding: Secondary | ICD-10-CM | POA: Insufficient documentation

## 2015-07-02 DIAGNOSIS — M25511 Pain in right shoulder: Secondary | ICD-10-CM | POA: Insufficient documentation

## 2015-07-02 DIAGNOSIS — M25559 Pain in unspecified hip: Secondary | ICD-10-CM | POA: Diagnosis not present

## 2015-07-02 DIAGNOSIS — K589 Irritable bowel syndrome without diarrhea: Secondary | ICD-10-CM | POA: Diagnosis not present

## 2015-07-02 DIAGNOSIS — H538 Other visual disturbances: Secondary | ICD-10-CM | POA: Diagnosis not present

## 2015-07-02 DIAGNOSIS — Z96641 Presence of right artificial hip joint: Secondary | ICD-10-CM | POA: Insufficient documentation

## 2015-07-02 DIAGNOSIS — H8110 Benign paroxysmal vertigo, unspecified ear: Secondary | ICD-10-CM | POA: Diagnosis not present

## 2015-07-02 DIAGNOSIS — F411 Generalized anxiety disorder: Secondary | ICD-10-CM | POA: Insufficient documentation

## 2015-07-02 DIAGNOSIS — M542 Cervicalgia: Secondary | ICD-10-CM | POA: Diagnosis not present

## 2015-07-02 DIAGNOSIS — M25551 Pain in right hip: Secondary | ICD-10-CM | POA: Diagnosis not present

## 2015-07-02 DIAGNOSIS — D62 Acute posthemorrhagic anemia: Secondary | ICD-10-CM | POA: Diagnosis not present

## 2015-07-02 DIAGNOSIS — G4486 Cervicogenic headache: Secondary | ICD-10-CM

## 2015-07-02 DIAGNOSIS — R42 Dizziness and giddiness: Secondary | ICD-10-CM | POA: Insufficient documentation

## 2015-07-02 DIAGNOSIS — K219 Gastro-esophageal reflux disease without esophagitis: Secondary | ICD-10-CM | POA: Insufficient documentation

## 2015-07-02 DIAGNOSIS — M47816 Spondylosis without myelopathy or radiculopathy, lumbar region: Secondary | ICD-10-CM

## 2015-07-02 DIAGNOSIS — E871 Hypo-osmolality and hyponatremia: Secondary | ICD-10-CM | POA: Diagnosis not present

## 2015-07-02 DIAGNOSIS — M539 Dorsopathy, unspecified: Secondary | ICD-10-CM

## 2015-07-02 DIAGNOSIS — Z79891 Long term (current) use of opiate analgesic: Secondary | ICD-10-CM | POA: Insufficient documentation

## 2015-07-02 DIAGNOSIS — I951 Orthostatic hypotension: Secondary | ICD-10-CM | POA: Diagnosis not present

## 2015-07-02 DIAGNOSIS — M47812 Spondylosis without myelopathy or radiculopathy, cervical region: Secondary | ICD-10-CM | POA: Diagnosis not present

## 2015-07-02 DIAGNOSIS — M25512 Pain in left shoulder: Secondary | ICD-10-CM | POA: Insufficient documentation

## 2015-07-02 DIAGNOSIS — R51 Headache: Secondary | ICD-10-CM | POA: Insufficient documentation

## 2015-07-02 DIAGNOSIS — M160 Bilateral primary osteoarthritis of hip: Secondary | ICD-10-CM | POA: Diagnosis not present

## 2015-07-02 DIAGNOSIS — F329 Major depressive disorder, single episode, unspecified: Secondary | ICD-10-CM | POA: Insufficient documentation

## 2015-07-02 DIAGNOSIS — K264 Chronic or unspecified duodenal ulcer with hemorrhage: Secondary | ICD-10-CM | POA: Insufficient documentation

## 2015-07-02 DIAGNOSIS — M961 Postlaminectomy syndrome, not elsewhere classified: Secondary | ICD-10-CM

## 2015-07-02 DIAGNOSIS — M545 Low back pain: Secondary | ICD-10-CM | POA: Diagnosis not present

## 2015-07-02 DIAGNOSIS — M5412 Radiculopathy, cervical region: Secondary | ICD-10-CM

## 2015-07-02 DIAGNOSIS — J45909 Unspecified asthma, uncomplicated: Secondary | ICD-10-CM | POA: Diagnosis not present

## 2015-07-02 DIAGNOSIS — Z5181 Encounter for therapeutic drug level monitoring: Secondary | ICD-10-CM | POA: Diagnosis not present

## 2015-07-02 MED ORDER — OXYCODONE HCL 10 MG PO TABS: 10.0000 mg | ORAL_TABLET | Freq: Four times a day (QID) | ORAL | Status: DC | PRN

## 2015-07-02 NOTE — Progress Notes (Signed)
Patient's Name: [name]  Patient type: Established  MRN: [id number]  Service setting: Ambulatory outpatient  DOB: 1960-10-10  Location: [facility]  DOS: 07/02/2015  Primary Care Physician: Margarita Rana, MD  Note by: Kathlen Brunswick. Dossie Arbour, M.D, DABA, DABAPM, DABPM, Milagros Evener, FIPP  Referring Physician: Margarita Rana, MD  Specialty: Board-Certified Interventional Pain Management     Primary Reason(s) for Visit: Encounter for prescription drug management (Level of risk: moderate) CC: Hip Pain   HPI  [name] is a 55 y.o. year old, female patient, who returns today as an established patient. She has Postoperative anemia due to acute blood loss; Postop Hyponatremia; Chronic neck pain; Orthostatic hypotension; Dizziness; Anxiety state; Asthma, mild; Blurred vision; Benign paroxysmal positional nystagmus; Congenital renal agenesis and dysgenesis; Clinical depression; Colon, diverticulosis; Duodenal ulcer with hemorrhage and perforation ([place]); Fatigue; Acid reflux; Cannot sleep; IBS (irritable bowel syndrome); Awareness of heartbeats; Addison anemia; RAD (reactive airway disease); Apnea, sleep; Esophagitis, reflux; DOE (dyspnea on exertion); Chronic left shoulder pain; Chronic right hip pain; Encounter for therapeutic drug level monitoring; Long term current use of opiate analgesic; Long term prescription opiate use; Opiate use (60 MME/Day); Chronic pain syndrome; Chronic pain; Steroid intolerance; Cervical spine pain; Cervical spondylosis; Cervical facet arthropathy (bilateral); History of cervical spinal surgery; Chronic shoulder pain (Location of Secondary source of pain) (Bilateral) (L>R); 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 Her primarily concern today is the Hip Pain   Pain Assessment: Self-Reported Pain Score: 3  Reported level is compatible with observation Pain Type: Chronic pain Pain Location: Hip Pain Orientation: Left Pain Descriptors / Indicators: Aching, Constant, Sharp, Shooting Pain Frequency: Constant  The patient comes into the clinics today for pharmacological management of her chronic pain.  Date of Last Visit: 04/05/15 Service Provided on Last Visit: Med Refill  Controlled Substance Pharmacotherapy Assessment & REMS (Risk Evaluation and Mitigation Strategy)  Analgesic: Oxycodone IR 10 mg every 6 hours (40 mg/day) Pill Count: Pill count is 0/120 oxycodone hcl 10mg  tablets filled on 06/01/2015 MME/day: 60 mg/day Pharmacokinetics: Onset of action (Liberation/Absorption): Within expected pharmacological parameters Time to Peak effect (Distribution): Timing and results are as within normal expected parameters Duration of action (Metabolism/Excretion): Within normal limits for medication Pharmacodynamics: Analgesic Effect: More than 50% Activity Facilitation: Medication(s) allow patient to sit, stand, walk, and do the basic ADLs Perceived Effectiveness: Described as relatively effective, allowing for increase in activities of daily living (ADL) Side-effects or Adverse reactions: None reported Monitoring: [place] PMP: Online review of the past 9-month period conducted. Compliant with practice rules and regulations UDS Results/interpretation: Last UDS done on 04/05/2015 came back abnormal due to the unexpected absence of alprazolam, oxycodone, and  fentanyl, all of which had been previously declared. Medication Assessment Form: Reviewed. Patient indicates being compliant with therapy Treatment compliance: Compliant Risk Assessment: Aberrant Behavior: None observed today Substance Use Disorder (SUD) Risk Level: No change since last visit Risk of opioid abuse or dependence: 0.7-3.0% with doses ? 36 MME/day and 6.1-26% with doses ?  120 MME/day. Opioid Risk Tool (ORT) Score: Total Score: 4 Moderate Risk for SUD (Score between 4-7) Depression Scale Score: PHQ-2: PHQ-2 Total Score: 0 No depression (0) PHQ-9: PHQ-9 Total Score: 0 No depression (0-4)  Pharmacologic Plan: No change in therapy, at this time  Laboratory Chemistry  Inflammation Markers Lab Results  Component Value Date   ESRSEDRATE 37* 04/05/2015   CRP 0.5 04/05/2015    Renal Function Lab Results  Component Value Date   BUN 11 04/06/2015   CREATININE 0.78 04/06/2015   GFRAA >60 04/06/2015   GFRNONAA >60 04/06/2015    Hepatic Function Lab Results  Component Value Date   AST 19 04/06/2015   ALT 13* 04/06/2015   ALBUMIN 4.7 04/06/2015    Electrolytes Lab Results  Component Value Date   NA 137 04/06/2015   K 3.7 04/06/2015   CL 103 04/06/2015   CALCIUM 10.0 04/06/2015   MG 2.1 04/05/2015    Pain Modulating Vitamins No results found for: [place], VD125OH2TOT, [id number], [id number], VITAMINB12  Coagulation Parameters Lab Results  Component Value Date   INR 1.05 03/06/2015   LABPROT 13.9 03/06/2015    Note: I personally reviewed the above data. Results shared with patient.  Meds  The patient has a current medication list which includes the following prescription(s): albuterol, alprazolam, bupropion, fluticasone, fluticasone-salmeterol, linzess, loratadine, metoprolol succinate, montelukast, omeprazole, ondansetron, oxycodone hcl, oxycodone hcl, oxycodone hcl, aerochamber mv, and valacyclovir.  Current Outpatient Prescriptions on File Prior to Visit   Medication Sig  . albuterol (PROVENTIL HFA;VENTOLIN HFA) 108 (90 BASE) MCG/ACT inhaler Inhale 2 puffs into the lungs every 6 (six) hours as needed for wheezing.  Marland Kitchen ALPRAZolam (XANAX) 1 MG tablet TAKE 1/2 A TABLET BY MOUTH EVERY MORNING, TAKE 1/2 A TABLET AT LUNCH AND 2 TABLETS AT BEDTIME  . buPROPion (WELLBUTRIN SR) 150 MG 12 hr tablet TAKE 1 TABLET BY MOUTH TWICE A DAY  . fluticasone (FLONASE) 50 MCG/ACT nasal spray Place 2 sprays into the nose daily.  . Fluticasone-Salmeterol (ADVAIR DISKUS) 500-50 MCG/DOSE AEPB Inhale 1 puff into the lungs 2 (two) times daily.  Marland Kitchen LINZESS 290 MCG CAPS capsule TAKE ONE CAPSULE BY MOUTH EVERY DAY  . loratadine (CLARITIN REDITABS) 10 MG dissolvable tablet Take 1 tablet by mouth daily.  . metoprolol succinate (TOPROL-XL) 50 MG 24 hr tablet TAKE 1 TABLET BY MOUTH EVERY DAY  . montelukast (SINGULAIR) 10 MG tablet TAKE 1 TABLET BY MOUTH EVERY DAY  . omeprazole (PRILOSEC) 20 MG capsule TAKE ONE CAPSULE BY MOUTH TWICE A DAY  . ondansetron (ZOFRAN) 4 MG tablet Take 1 tablet (4 mg total) by mouth every 8 (eight) hours as needed for nausea or vomiting.  Marland Kitchen Spacer/Aero-Holding Chambers (AEROCHAMBER MV) inhaler by Does not apply route.  . valACYclovir (VALTREX) 1000 MG tablet TAKE TWO TABLETS BY MOUTH TWICE DAILY FOR ONE DAY FEVER BLISTER (Patient taking differently: as needed. TAKE TWO TABLETS BY MOUTH TWICE DAILY FOR ONE DAY FEVER BLISTER)   No current facility-administered medications on file prior to visit.    ROS  Constitutional: Afebrile, no chills, well hydrated and well nourished Gastrointestinal: No upper or lower GI bleeding, no nausea, no vomiting and no acute GI distress Musculoskeletal: No acute joint swelling or redness, no acute loss of range of motion and no acute onset weakness Neurological: Denies any acute onset apraxia, no episodes of paralysis, no acute loss of coordination, no acute loss of consciousness and no acute onset aphasia, dysarthria,  agnosia, or amnesia  Allergies  [name] is allergic to amoxicillin; clindamycin; codeine; sulfa antibiotics; shellfish allergy; decadron; papaya derivatives; prednisone; betadine; chlorhexidine; clarithromycin; clindamycin/lincomycin; and other.  [place]  Medical:  [name]  has a past medical history of Headache(784.0); PONV (postoperative nausea and vomiting); Abnormal EKG; Hypertension; Asthma; GERD (gastroesophageal reflux disease); DDD (degenerative disc disease); Pain; Anemia; History of blood transfusion; Inverted T wave; Pneumonia; History of kidney stones; Right foot drop; Anxiety; Depression; History of cervical spinal surgery (01/04/2015); Chronic headaches; Narrowing of intervertebral disc space (08/18/2014); Right hip arthralgia (08/18/2014); Cephalalgia (08/18/2014); H/O arthrodesis (08/18/2014); and Cervical disc disease (08/18/2014). Family: family history includes Aneurysm in her maternal grandmother and mother; Breast cancer (age of onset: 80) in her paternal grandmother. Surgical:  has past surgical history that includes RIGHT HIP ARTHROSCOPY FOR LABRAL TEAR (ABOUT 2010); Anterior fusion cervical spine (MAY 2012); Nasal septum surgery (MARCH 2013); Back surgery (2009); Tonsillectomy; Abdominal hysterectomy; DIAGNOSTIC LAPAROSCOPIES - MULTIPLE FOR ENDOMETRIOSIS; Cholecystectomy; Hip arthroscopy (09/20/2011); Shoulder arthroscopy (05-06-12); Carpal tunnel release (05-06-12); Total hip arthroplasty (Right, 05/13/2012); Anterior cervical decomp/discectomy fusion (N/A, 10/30/2012); Posterior cervical fusion/foraminotomy (N/A, 04/16/2013); Breast biopsy (Right, 2008); and Breast excisional biopsy (Left, 1998). Tobacco:  reports that she has never smoked. She has never used smokeless tobacco. Alcohol:  reports that she does not drink alcohol. Drug:  reports that she does not use illicit drugs.  Physical Examination  Constitutional Vitals: Blood pressure 134/87, pulse 63, temperature 98.2 F (36.8 C),  temperature source Oral, resp. rate 16, height 5\' 4"  (1.626 m), weight 155 lb (70.308 kg), SpO2 99 %. Calculated BMI: Body mass index is 26.59 kg/(m^2). (25-29.9 kg/m2) Overweight - 20% higher incidence of chronic pain General appearance: Alert, oriented x 3, in no apparent acute distress. Well nourished, well hydrated Eyes: PERLA Respiratory: No evidence respiratory distress, no audible rales or ronchi and no use of accessory muscles of respiration Psych: Alert, oriented to person, oriented to place and oriented to time  Cervical Spine Exam  Inspection: Normal anatomy, no anomalies observed Cervical Lordosis: Normal Alignment: Symetrical Functional ROM: Limited AROM: Decreased Sensory: Tenderness to palpation.  Upper Extremity Exam    Right  Left  Inspection: No gross anomalies detected  Inspection: No gross anomalies detected  Functional ROM: Limited shoulder   Functional ROM: Limited shoulder   AROM: Decreased  AROM: Decreased shoulder   Sensory: No sensory anomalies reported or detected  Sensory: No sensory anomalies reported or detected  Motor: Unremarkable  Motor: Unremarkable  Vascular: Normal skin color, temperature, and hair growth. No peripheral edema or cyanosis  Vascular: Normal skin color, temperature, and hair growth. No peripheral edema or cyanosis   Thoracic Spine  Inspection: No gross anomalies detected Alignment: Symetrical Functional ROM: Within functional limits [facility]) AROM: Adequate Palpation: WNL  Lumbar Spine  Inspection: No gross anomalies detected Alignment: Symetrical Functional ROM: Within functional limits (WFL) AROM: Decreased Sensory: No sensory anomalies reported or detected Palpation: Tender Provocative Tests: Lumbar Hyperextension and rotation test: Positive for bilateral lumbar facet pain Patrick's Maneuver: deferred  Gait Assessment  Gait: Antalgic (limping)  Lower Extremities    Right  Left  Inspection: No gross anomalies detected   Inspection: No gross anomalies detected  Functional ROM: Within functional limits [facility])  Functional ROM: Within functional limits (WFL)  AROM: Adequate  AROM: Adequate  Sensory: No sensory anomalies reported or detected  Sensory: No sensory anomalies reported or detected  Motor: Unremarkable  Motor: Unremarkable  Toe walk (S1): WNL  Toe walk (S1): WNL  Heal walk (L5):  WNL  Heal walk (L5): WNL   Assessment & Plan  Primary Diagnosis & Pertinent Problem List: The primary encounter diagnosis was Chronic pain. 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  Visit Diagnosis: 1. Chronic pain   2. Encounter for therapeutic drug level monitoring   3. Long term current use of opiate analgesic   4. Chronic low back pain   5. Chronic hip pain, unspecified laterality   6. Primary osteoarthritis of both hips   7. History of total hip replacement (THR) (Right)   8. 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 Chronic shoulder radicular pain (Bilateral) (L>R)   10. Chronic shoulder pain (Location of Secondary source of pain) (Bilateral) (L>R)   11. Lumbar facet syndrome (Location of Tertiary source of pain) (Bilateral) (L>R)   12. Cervicogenic headache     Problems updated and reviewed during this visit: Problem  Cervicogenic Headache  Opiate use (60 MME/Day)    Problem-specific Plan(s): No problem-specific assessment & plan notes found for this  encounter.  No new assessment & plan notes have been filed under this hospital service since the last note was generated. Service: Pain Management   Plan of Care   Problem List Items Addressed This Visit      High   Cervicogenic headache (Chronic)   Relevant Medications   Oxycodone HCl 10 MG TABS   Oxycodone HCl 10 MG TABS   Oxycodone HCl 10 MG TABS   Chronic hip pain (Location of Primary Source of Pain) (Bilateral) (L>R) (Chronic)   Relevant Medications   Oxycodone HCl 10 MG TABS   Oxycodone HCl 10 MG TABS   Oxycodone HCl 10 MG TABS   Chronic low back pain (Location of Tertiary source of pain) (Bilateral) (L>R) (Chronic)   Relevant Medications   Oxycodone HCl 10 MG TABS   Oxycodone HCl 10 MG TABS   Oxycodone HCl 10 MG TABS   Chronic pain - Primary (Chronic)   Relevant Medications   Oxycodone HCl 10 MG TABS   Oxycodone HCl 10 MG TABS   Oxycodone HCl 10 MG TABS   Chronic shoulder pain (Location of Secondary source of pain) (Bilateral) (L>R) (Chronic)   Chronic shoulder radicular pain (Bilateral) (L>R) (Chronic)   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) (Chronic)   Relevant Medications   Oxycodone HCl 10 MG TABS   Oxycodone HCl 10 MG TABS   Oxycodone HCl 10 MG TABS   History of total hip replacement (THR) (Right)   Lumbar facet syndrome (Location of Tertiary source of pain) (Bilateral) (L>R) (Chronic)   Relevant Medications   Oxycodone HCl 10 MG TABS   Oxycodone HCl 10 MG TABS   Oxycodone HCl 10 MG TABS   Osteoarthritis of hip (Location of Primary Source of Pain) (Bilateral) (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 THR) (Chronic)   Relevant Medications   Oxycodone HCl 10 MG TABS   Oxycodone HCl 10 MG TABS   Oxycodone HCl 10 MG TABS     Medium   Encounter for therapeutic drug level monitoring  Long term current use of opiate analgesic (Chronic)   Relevant Orders   ToxASSURE Select 13 (MW), Urine       Pharmacotherapy (Medications Ordered): Meds ordered this encounter   Medications  . Oxycodone HCl 10 MG TABS    Sig: Take 1 tablet (10 mg total) by mouth every 6 (six) hours as needed.    Dispense:  120 tablet    Refill:  0    Do not place this medication, or any other prescription from our practice, on "Automatic Refill". Patient may have prescription filled one day early if pharmacy is closed on scheduled refill date. Do not fill until: 07/02/15 To last until: 08/01/15  . Oxycodone HCl 10 MG TABS    Sig: Take 1 tablet (10 mg total) by mouth every 6 (six) hours as needed.    Dispense:  120 tablet    Refill:  0    Do not place this medication, or any other prescription from our practice, on "Automatic Refill". Patient may have prescription filled one day early if pharmacy is closed on scheduled refill date. Do not fill until: 08/01/15 To last until: 08/31/15  . Oxycodone HCl 10 MG TABS    Sig: Take 1 tablet (10 mg total) by mouth every 6 (six) hours as needed.    Dispense:  120 tablet    Refill:  0    Do not place this medication, or any other prescription from our practice, on "Automatic Refill". Patient may have prescription filled one day early if pharmacy is closed on scheduled refill date. Do not fill until: 08/31/15 To last until: 09/30/15    [facility] & Procedure Ordered: Orders Placed This Encounter  Procedures  . ToxASSURE Select 13 (MW), Urine    Imaging Ordered: None  Interventional Therapies: Scheduled:  None at this time.    Considering:  Diagnostic bilateral lumbar facet block without steroids under fluoroscopic guidance and IV sedation.    PRN Procedures:  None at this time.    Referral(s) or Consult(s): None at this time.  New Prescriptions   No medications on file    Medications administered during this visit: [name] had no medications administered during this visit.  Future Appointments Date Time Provider [place]  09/20/2015 8:40 AM Milinda Pointer, MD [facility] None    Primary Care Physician:  Margarita Rana, MD Location: [facility] Note by: Kathlen Brunswick. Dossie Arbour, M.D, DABA, DABAPM, DABPM, DABIPP, FIPP  Pain Score Disclaimer: We use the NRS-11 scale. This is a self-reported, subjective measurement of pain severity with only modest accuracy. It is used primarily to identify changes within a particular patient. It must be understood that outpatient pain scales are significantly less accurate that those used for research, where they can be applied under ideal controlled circumstances with minimal exposure to variables. In reality, the score is likely to be a combination of pain intensity and pain affect, where pain affect describes the degree of emotional arousal or changes in action readiness caused by the sensory experience of pain. Factors such as social and work situation, setting, emotional state, anxiety levels, expectation, and prior pain experience may influence pain perception and show large inter-individual differences that may also be affected by time variables.

## 2015-07-02 NOTE — Progress Notes (Signed)
Safety precautions to be maintained throughout the outpatient stay will include: orient to surroundings, keep bed in low position, maintain call bell within reach at all times, provide assistance with transfer out of bed and ambulation. Pill count is 0/120 oxycodone hcl 10mg  tablets filled on 06/01/2015

## 2015-07-05 ENCOUNTER — Encounter: Payer: Self-pay | Admitting: Pain Medicine

## 2015-07-05 DIAGNOSIS — M47816 Spondylosis without myelopathy or radiculopathy, lumbar region: Secondary | ICD-10-CM | POA: Insufficient documentation

## 2015-07-05 DIAGNOSIS — M5412 Radiculopathy, cervical region: Secondary | ICD-10-CM | POA: Insufficient documentation

## 2015-07-05 DIAGNOSIS — Z96641 Presence of right artificial hip joint: Secondary | ICD-10-CM | POA: Insufficient documentation

## 2015-07-06 DIAGNOSIS — G4486 Cervicogenic headache: Secondary | ICD-10-CM | POA: Insufficient documentation

## 2015-07-06 DIAGNOSIS — R51 Headache: Secondary | ICD-10-CM

## 2015-07-09 LAB — TOXASSURE SELECT 13 (MW), URINE: PDF: 0

## 2015-07-15 DIAGNOSIS — H5203 Hypermetropia, bilateral: Secondary | ICD-10-CM | POA: Diagnosis not present

## 2015-07-18 NOTE — Progress Notes (Signed)
Quick Note:  NOTE: This forensic urine drug screen (UDS) test was conducted using a state-of-the-art ultra high performance liquid chromatography and mass spectrometry system (UPLC/MS-MS), the most sophisticated and accurate method available. UPLC/MS-MS is 1,000 times more precise and accurate than standard gas chromatography and mass spectrometry (GC/MS). This system can analyze 26 drug categories and 180 drug compounds.  The findings of this UDT were reported as abnormal due to inconsistencies with expected results. An expected substance was not present in the sample. Expectations were based on the medication history provided by the patient at the time of sample collection. These results are concerning due to the possibility of opioid diversion, or non-compliance with instructions on how to take the medication, including increase intake of medication early in the prescription refill, possibly running out towards the end of the prescribed period. ______ 

## 2015-08-17 ENCOUNTER — Other Ambulatory Visit: Payer: Self-pay | Admitting: Family Medicine

## 2015-08-17 DIAGNOSIS — F411 Generalized anxiety disorder: Secondary | ICD-10-CM

## 2015-08-17 MED ORDER — ALPRAZOLAM 1 MG PO TABS: ORAL_TABLET | ORAL | Status: DC

## 2015-08-17 NOTE — Telephone Encounter (Signed)
Printed, please fax or call in to pharmacy. Thank you.   

## 2015-08-17 NOTE — Telephone Encounter (Signed)
Pt called for refill on her ALPRAZolam Duanne Moron) 1 MG tablet Taking 04/15/15 -- Margarita Rana, MD TAKE 1/2 A TABLET BY MOUTH EVERY   She uses [facility] [place]   Thanks, Con Memos

## 2015-08-18 ENCOUNTER — Other Ambulatory Visit: Payer: Self-pay | Admitting: Family Medicine

## 2015-08-18 DIAGNOSIS — J45909 Unspecified asthma, uncomplicated: Secondary | ICD-10-CM

## 2015-08-18 DIAGNOSIS — K219 Gastro-esophageal reflux disease without esophagitis: Secondary | ICD-10-CM

## 2015-09-09 ENCOUNTER — Encounter: Payer: Medicare Other | Admitting: Pain Medicine

## 2015-09-16 ENCOUNTER — Ambulatory Visit (INDEPENDENT_AMBULATORY_CARE_PROVIDER_SITE_OTHER): Payer: Medicare Other | Admitting: Physician Assistant

## 2015-09-16 ENCOUNTER — Encounter: Payer: Self-pay | Admitting: Physician Assistant

## 2015-09-16 VITALS — BP 140/80 | HR 63 | Temp 98.3°F | Resp 16 | Wt 154.6 lb

## 2015-09-16 DIAGNOSIS — F411 Generalized anxiety disorder: Secondary | ICD-10-CM | POA: Diagnosis not present

## 2015-09-16 DIAGNOSIS — J302 Other seasonal allergic rhinitis: Secondary | ICD-10-CM | POA: Diagnosis not present

## 2015-09-16 MED ORDER — LORATADINE 10 MG PO TBDP: 10.0000 mg | ORAL_TABLET | Freq: Every day | ORAL | Status: DC

## 2015-09-16 MED ORDER — ALPRAZOLAM 1 MG PO TABS: ORAL_TABLET | ORAL | Status: DC

## 2015-09-16 NOTE — Patient Instructions (Signed)
Generalized Anxiety Disorder Generalized anxiety disorder (GAD) is a mental disorder. It interferes with life functions, including relationships, work, and school. GAD is different from normal anxiety, which everyone experiences at some point in their lives in response to specific life events and activities. Normal anxiety actually helps us prepare for and get through these life events and activities. Normal anxiety goes away after the event or activity is over.  GAD causes anxiety that is not necessarily related to specific events or activities. It also causes excess anxiety in proportion to specific events or activities. The anxiety associated with GAD is also difficult to control. GAD can vary from mild to severe. People with severe GAD can have intense waves of anxiety with physical symptoms (panic attacks).  SYMPTOMS The anxiety and worry associated with GAD are difficult to control. This anxiety and worry are related to many life events and activities and also occur more days than not for 6 months or longer. People with GAD also have three or more of the following symptoms (one or more in children):  Restlessness.   Fatigue.  Difficulty concentrating.   Irritability.  Muscle tension.  Difficulty sleeping or unsatisfying sleep. DIAGNOSIS GAD is diagnosed through an assessment by your health care provider. Your health care provider will ask you questions aboutyour mood,physical symptoms, and events in your life. Your health care provider may ask you about your medical history and use of alcohol or drugs, including prescription medicines. Your health care provider may also do a physical exam and blood tests. Certain medical conditions and the use of certain substances can cause symptoms similar to those associated with GAD. Your health care provider may refer you to a mental health specialist for further evaluation. TREATMENT The following therapies are usually used to treat GAD:    Medication. Antidepressant medication usually is prescribed for long-term daily control. Antianxiety medicines may be added in severe cases, especially when panic attacks occur.   Talk therapy (psychotherapy). Certain types of talk therapy can be helpful in treating GAD by providing support, education, and guidance. A form of talk therapy called cognitive behavioral therapy can teach you healthy ways to think about and react to daily life events and activities.  Stress managementtechniques. These include yoga, meditation, and exercise and can be very helpful when they are practiced regularly. A mental health specialist can help determine which treatment is best for you. Some people see improvement with one therapy. However, other people require a combination of therapies.   This information is not intended to replace advice given to you by your health care provider. Make sure you discuss any questions you have with your health care provider.   Document Released: 06/24/2012 Document Revised: 03/20/2014 Document Reviewed: 06/24/2012 Elsevier Interactive Patient Education 2016 [employer]  

## 2015-09-16 NOTE — Progress Notes (Signed)
Patient: [name] Female    DOB: [birth year]   55 y.o.   MRN: [id number] Visit Date: 09/16/2015  Today's Provider: Mar Daring, PA-C   Chief Complaint  Patient presents with  . Medication Refill   Subjective:    HPI Patient is here for refill medication on Xanax.  Anxiety: Patient complains of anxiety disorder.  She has the following symptoms: fatigue. Onset of symptoms was approximately several years ago, stable since that time. She denies current suicidal and homicidal ideation. Family history significant for anxiety she reports her kids have anxiety.  Allergies: Patient is also requesting refill on Loratadine 10 MG dissolvable tablets.Symptoms are runny nose, sneezing, watery eyes, post nasal drip and coughing at night.    Allergies  Allergen Reactions  . Amoxicillin Hives  . Clindamycin Hives  . Codeine Hives  . Sulfa Antibiotics Nausea And Vomiting and Hives       . Shellfish Allergy Hives  . Decadron [Dexamethasone] Other (See Comments)    Hot flashes, insomnia, "manic"  . Papaya Derivatives Hives  . Prednisone     High blood pressure, flushed, mood changes, heart palpitations  . Betadine [Povidone Iodine] Hives       . Chlorhexidine Hives       . Clarithromycin Hives  . Clindamycin/Lincomycin Hives  . Other Hives     Mango    Current Meds  Medication Sig  . albuterol (PROVENTIL HFA;VENTOLIN HFA) 108 (90 BASE) MCG/ACT inhaler Inhale 2 puffs into the lungs every 6 (six) hours as needed for wheezing.  Marland Kitchen ALPRAZolam (XANAX) 1 MG tablet TAKE 1/2 A TABLET BY MOUTH EVERY MORNING, TAKE 1/2 A TABLET AT LUNCH AND 2 TABLETS AT BEDTIME  . buPROPion (WELLBUTRIN SR) 150 MG 12 hr tablet TAKE 1 TABLET BY MOUTH TWICE A DAY  . fluticasone (FLONASE) 50 MCG/ACT nasal spray Place 2 sprays into the nose daily.  . Fluticasone-Salmeterol (ADVAIR DISKUS) 500-50 MCG/DOSE AEPB Inhale 1 puff into the lungs 2 (two) times daily.  Marland Kitchen LINZESS 290 MCG CAPS capsule TAKE  ONE CAPSULE BY MOUTH EVERY DAY  . loratadine (CLARITIN REDITABS) 10 MG dissolvable tablet Take 1 tablet by mouth daily.  . metoprolol succinate (TOPROL-XL) 50 MG 24 hr tablet TAKE 1 TABLET BY MOUTH EVERY DAY  . montelukast (SINGULAIR) 10 MG tablet TAKE 1 TABLET BY MOUTH EVERY DAY  . omeprazole (PRILOSEC) 20 MG capsule TAKE ONE CAPSULE BY MOUTH TWICE A DAY  . Oxycodone HCl 10 MG TABS Take 1 tablet (10 mg total) by mouth every 6 (six) hours as needed.  . Oxycodone HCl 10 MG TABS Take 1 tablet (10 mg total) by mouth every 6 (six) hours as needed.  Marland Kitchen Spacer/Aero-Holding Chambers (AEROCHAMBER MV) inhaler by Does not apply route.  . valACYclovir (VALTREX) 1000 MG tablet TAKE TWO TABLETS BY MOUTH TWICE DAILY FOR ONE DAY FEVER BLISTER (Patient taking differently: as needed. TAKE TWO TABLETS BY MOUTH TWICE DAILY FOR ONE DAY FEVER BLISTER)    Review of Systems  Constitutional: Negative.   HENT: Positive for postnasal drip, rhinorrhea, sinus pressure and sneezing.   Eyes: Positive for itching.       Watery eyes  Respiratory: Positive for cough and shortness of breath. Negative for chest tightness and wheezing.   Cardiovascular: Negative.   Gastrointestinal: Negative.   Psychiatric/Behavioral: Negative for suicidal ideas, behavioral problems, sleep disturbance and decreased concentration. The patient is not nervous/anxious.     Social History  Substance  Use Topics  . Smoking status: Never Smoker   . Smokeless tobacco: Never Used  . Alcohol Use: No   Objective:   BP 140/80 mmHg  Pulse 63  Temp(Src) 98.3 F (36.8 C) (Oral)  Resp 16  Wt 154 lb 9.6 oz (70.126 kg)  SpO2 97%  Physical Exam  Constitutional: She appears well-developed and well-nourished. No distress.  Neck: Normal range of motion. Neck supple.  Cardiovascular: Normal rate, regular rhythm and normal heart sounds.  Exam reveals no gallop and no friction rub.   No murmur heard. Pulmonary/Chest: Effort normal and breath sounds  normal. No respiratory distress. She has no wheezes. She has no rales.  Skin: She is not diaphoretic.  Psychiatric: She has a normal mood and affect. Her behavior is normal. Judgment and thought content normal.  Vitals reviewed.     Assessment & Plan:     1. Other seasonal allergic rhinitis Stable. Diagnosis pulled for medication refill. Continue current medical treatment plan. - loratadine (CLARITIN REDITABS) 10 MG dissolvable tablet; Take 1 tablet (10 mg total) by mouth daily.  Dispense: 90 tablet; Refill: 3  2. Generalized anxiety disorder Stable. Diagnosis pulled for medication refill. Continue current medical treatment plan. I will see her back in 3-6 months for her CPE. - ALPRAZolam (XANAX) 1 MG tablet; TAKE 1/2 A TABLET BY MOUTH EVERY MORNING, TAKE 1/2 A TABLET AT LUNCH AND 2 TABLETS AT BEDTIME  Dispense: 90 tablet; Refill: Enigma, PA-C  [facility]

## 2015-09-20 ENCOUNTER — Encounter: Payer: Medicare Other | Admitting: Pain Medicine

## 2015-09-29 ENCOUNTER — Encounter: Payer: Medicare Other | Admitting: Pain Medicine

## 2015-10-07 ENCOUNTER — Encounter: Payer: Self-pay | Admitting: Pain Medicine

## 2015-10-07 ENCOUNTER — Ambulatory Visit: Payer: Medicare Other | Attending: Pain Medicine | Admitting: Pain Medicine

## 2015-10-07 VITALS — BP 129/80 | HR 53 | Temp 98.4°F | Resp 16 | Ht 64.0 in | Wt 155.0 lb

## 2015-10-07 DIAGNOSIS — G4730 Sleep apnea, unspecified: Secondary | ICD-10-CM | POA: Insufficient documentation

## 2015-10-07 DIAGNOSIS — M25511 Pain in right shoulder: Secondary | ICD-10-CM | POA: Insufficient documentation

## 2015-10-07 DIAGNOSIS — D62 Acute posthemorrhagic anemia: Secondary | ICD-10-CM | POA: Diagnosis not present

## 2015-10-07 DIAGNOSIS — R5383 Other fatigue: Secondary | ICD-10-CM | POA: Insufficient documentation

## 2015-10-07 DIAGNOSIS — Z79891 Long term (current) use of opiate analgesic: Secondary | ICD-10-CM | POA: Diagnosis not present

## 2015-10-07 DIAGNOSIS — K5730 Diverticulosis of large intestine without perforation or abscess without bleeding: Secondary | ICD-10-CM | POA: Insufficient documentation

## 2015-10-07 DIAGNOSIS — F1190 Opioid use, unspecified, uncomplicated: Secondary | ICD-10-CM

## 2015-10-07 DIAGNOSIS — M160 Bilateral primary osteoarthritis of hip: Secondary | ICD-10-CM | POA: Insufficient documentation

## 2015-10-07 DIAGNOSIS — Z5181 Encounter for therapeutic drug level monitoring: Secondary | ICD-10-CM

## 2015-10-07 DIAGNOSIS — M25552 Pain in left hip: Secondary | ICD-10-CM | POA: Diagnosis present

## 2015-10-07 DIAGNOSIS — M542 Cervicalgia: Secondary | ICD-10-CM | POA: Insufficient documentation

## 2015-10-07 DIAGNOSIS — Q602 Renal agenesis, unspecified: Secondary | ICD-10-CM | POA: Diagnosis not present

## 2015-10-07 DIAGNOSIS — M25559 Pain in unspecified hip: Secondary | ICD-10-CM | POA: Insufficient documentation

## 2015-10-07 DIAGNOSIS — K589 Irritable bowel syndrome without diarrhea: Secondary | ICD-10-CM | POA: Insufficient documentation

## 2015-10-07 DIAGNOSIS — K209 Esophagitis, unspecified: Secondary | ICD-10-CM | POA: Diagnosis not present

## 2015-10-07 DIAGNOSIS — J45909 Unspecified asthma, uncomplicated: Secondary | ICD-10-CM | POA: Diagnosis not present

## 2015-10-07 DIAGNOSIS — M25512 Pain in left shoulder: Secondary | ICD-10-CM

## 2015-10-07 DIAGNOSIS — G8929 Other chronic pain: Secondary | ICD-10-CM | POA: Insufficient documentation

## 2015-10-07 DIAGNOSIS — M47812 Spondylosis without myelopathy or radiculopathy, cervical region: Secondary | ICD-10-CM | POA: Insufficient documentation

## 2015-10-07 DIAGNOSIS — H8110 Benign paroxysmal vertigo, unspecified ear: Secondary | ICD-10-CM | POA: Insufficient documentation

## 2015-10-07 DIAGNOSIS — K266 Chronic or unspecified duodenal ulcer with both hemorrhage and perforation: Secondary | ICD-10-CM | POA: Diagnosis not present

## 2015-10-07 DIAGNOSIS — D510 Vitamin B12 deficiency anemia due to intrinsic factor deficiency: Secondary | ICD-10-CM | POA: Insufficient documentation

## 2015-10-07 DIAGNOSIS — H538 Other visual disturbances: Secondary | ICD-10-CM | POA: Diagnosis not present

## 2015-10-07 DIAGNOSIS — M545 Low back pain: Secondary | ICD-10-CM | POA: Insufficient documentation

## 2015-10-07 DIAGNOSIS — R51 Headache: Secondary | ICD-10-CM | POA: Insufficient documentation

## 2015-10-07 DIAGNOSIS — Z96641 Presence of right artificial hip joint: Secondary | ICD-10-CM | POA: Insufficient documentation

## 2015-10-07 DIAGNOSIS — K219 Gastro-esophageal reflux disease without esophagitis: Secondary | ICD-10-CM | POA: Insufficient documentation

## 2015-10-07 DIAGNOSIS — I951 Orthostatic hypotension: Secondary | ICD-10-CM | POA: Diagnosis not present

## 2015-10-07 DIAGNOSIS — F329 Major depressive disorder, single episode, unspecified: Secondary | ICD-10-CM | POA: Insufficient documentation

## 2015-10-07 DIAGNOSIS — E871 Hypo-osmolality and hyponatremia: Secondary | ICD-10-CM | POA: Diagnosis not present

## 2015-10-07 DIAGNOSIS — M5030 Other cervical disc degeneration, unspecified cervical region: Secondary | ICD-10-CM | POA: Insufficient documentation

## 2015-10-07 DIAGNOSIS — M533 Sacrococcygeal disorders, not elsewhere classified: Secondary | ICD-10-CM | POA: Insufficient documentation

## 2015-10-07 DIAGNOSIS — R0600 Dyspnea, unspecified: Secondary | ICD-10-CM | POA: Insufficient documentation

## 2015-10-07 DIAGNOSIS — F411 Generalized anxiety disorder: Secondary | ICD-10-CM | POA: Insufficient documentation

## 2015-10-07 DIAGNOSIS — M1610 Unilateral primary osteoarthritis, unspecified hip: Secondary | ICD-10-CM | POA: Insufficient documentation

## 2015-10-07 DIAGNOSIS — K264 Chronic or unspecified duodenal ulcer with hemorrhage: Secondary | ICD-10-CM | POA: Insufficient documentation

## 2015-10-07 MED ORDER — OXYCODONE HCL 10 MG PO TABS: 10.0000 mg | ORAL_TABLET | Freq: Four times a day (QID) | ORAL | 0 refills | Status: DC | PRN

## 2015-10-07 NOTE — Progress Notes (Signed)
Safety precautions to be maintained throughout the outpatient stay will include: orient to surroundings, keep bed in low position, maintain call bell within reach at all times, provide assistance with transfer out of bed and ambulation.   hERE TODAY FOR MEDICATION REFILL  Pt had recent MRI of left shoulder- tear in bicep (will heal) Pt dx witharthris and frozen shoulder

## 2015-10-07 NOTE — Progress Notes (Signed)
Pills remaining 0/120/ oxycodone filled 09/02/15

## 2015-10-07 NOTE — Progress Notes (Signed)
Patient's Name: [name]  Patient type: Established  MRN: [id number]  Service setting: Ambulatory outpatient  DOB: 09-Oct-1960  Location: [facility]  DOS: 10/07/2015  Primary Care Physician: Margarita Rana, MD  Note by: Kathlen Brunswick. Dossie Arbour, M.D  Referring Physician: Margarita Rana, MD  Specialty: Interventional Pain Management  Last Visit to Pain Management: 07/02/2015   Primary Reason(s) for Visit: Encounter for prescription drug management (Level of risk: moderate) CC: Hip Pain (left)   HPI  [name] is a 55 y.o. year old, female patient, who returns today as an established patient. She has Postoperative anemia due to acute blood loss; Postop Hyponatremia; Chronic neck pain; Orthostatic hypotension; Dizziness; Anxiety state; Asthma, mild; Blurred vision; Benign paroxysmal positional nystagmus; Congenital renal agenesis and dysgenesis; Clinical depression; Colon, diverticulosis; Duodenal ulcer with hemorrhage and perforation ([place]); Fatigue; Acid reflux; Cannot sleep; IBS (irritable bowel syndrome); Awareness of heartbeats; Addison anemia; RAD (reactive airway disease); Apnea, sleep; Esophagitis, reflux; DOE (dyspnea on exertion); Chronic left shoulder pain; Chronic right hip pain; Encounter for therapeutic drug level monitoring; Long term current use of opiate analgesic; Long term prescription opiate use; Opiate use (60 MME/Day); Chronic pain syndrome; Chronic pain; Steroid intolerance; Cervical spine pain; Cervical spondylosis; Cervical facet arthropathy (bilateral); History of cervical spinal surgery; Chronic shoulder pain (Location of Secondary source of pain) (Bilateral) (L>R); 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 Her primarily concern today is the Hip Pain (left)   Pain Assessment: Self-Reported Pain Score: 1              Reported level is compatible with observation       Pain Type: Chronic pain Pain Location: Hip Pain Orientation: Left Pain Descriptors / Indicators: Constant, Shooting, Nagging Pain Frequency: Constant  The patient comes into the clinics today for pharmacological management of her chronic pain. I last saw this patient on 07/02/2015. The patient  reports that she does not use drugs. Her body mass index is 26.61 kg/m.       Controlled Substance Pharmacotherapy Assessment & REMS (Risk Evaluation and Mitigation Strategy)  Analgesic: Oxycodone IR 10 mg every 6 hours (40 mg/day) MME/day: 60 mg/day Pill Count: 0/120/ oxycodone filled 09/02/15. Pharmacokinetics: Onset of action (Liberation/Absorption): Within expected pharmacological parameters Time to Peak effect (Distribution): Timing and results are as within normal expected parameters Duration of action (Metabolism/Excretion): Within normal limits for medication Pharmacodynamics: Analgesic Effect: More than 50% Activity Facilitation: Medication(s) allow patient to sit, stand, walk, and do the basic ADLs Perceived Effectiveness: Described as relatively effective, allowing for increase in activities of daily living (ADL) Side-effects or Adverse reactions: None reported Monitoring: [place] PMP: Online review of the past 59-month period conducted. Compliant with practice rules and regulations Last UDS on record: ToxAssure Select 13  Date Value Ref Range Status  07/02/2015 FINAL  Final    Comment:     ==================================================================== TOXASSURE SELECT 13 (MW) ==================================================================== Test  Result       Flag       Units Drug Present and Declared for Prescription Verification   Alprazolam                     469          EXPECTED   ng/mg creat   Alpha-hydroxyalprazolam        386          EXPECTED   ng/mg creat    Source of alprazolam is a scheduled prescription medication.    Alpha-hydroxyalprazolam is an expected metabolite of alprazolam. Drug Absent but Declared for Prescription Verification   Oxycodone                      Not Detected UNEXPECTED ng/mg creat ==================================================================== Test                      Result    Flag   Units      Ref Range   Creatinine              35               mg/dL      >=20 ==================================================================== Declared Medications:  The flagging and interpretation on this report are based on the  following declared medications.  Unexpected results may arise from  inaccuracies in the declared medications.  **Note: The testing scope of this panel includes these medications:  Alprazolam  Oxycodone  **Note: The testing scope of this panel does not include following  reported medications:  Albuterol  Bupropion (Wellbutrin)  Fluticasone (Advair)  Fluticasone (Flonase)  Linaclotide (Linzess)  Loratadine  Metoprolol  Montelukast (Singulair)  Omeprazole  Ondansetron  Salmeterol (Advair)  Valacyclovir (Valtrex) ==================================================================== For clinical consultation, please call [phone]. ====================================================================    UDS interpretation: Compliant This may be an issue with the scheduling as opposed to taking more medication than prescribed. We will make an effort to schedule the  return appointment before the patient runs out of medication. Medication Assessment Form: Reviewed. Patient indicates being compliant with therapy Treatment compliance: Compliant Risk Assessment: Aberrant Behavior: None observed today Substance Use Disorder (SUD) Risk Level: No change since last visit Risk of opioid abuse or dependence: 0.7-3.0% with doses ? 36 MME/day and 6.1-26% with doses ? 120 MME/day. Opioid Risk Tool (ORT) Score: Total Score: 1    Depression Scale Score: PHQ-2: PHQ-2 Total Score: 0       PHQ-9: PHQ-9 Total Score: 0        Pharmacologic Plan: No change in therapy, at this time  Laboratory Chemistry  Inflammation Markers Lab Results  Component Value Date   ESRSEDRATE 37 (H) 04/05/2015   CRP 0.5 04/05/2015    Renal Function Lab Results  Component Value Date   BUN 11 04/06/2015   CREATININE 0.78 04/06/2015   GFRAA >60 04/06/2015   GFRNONAA >60 04/06/2015    Hepatic Function Lab Results  Component Value Date   AST 19 04/06/2015   ALT 13 (L) 04/06/2015   ALBUMIN 4.7 04/06/2015    Electrolytes Lab Results  Component Value Date   NA 137 04/06/2015   K 3.7 04/06/2015   CL 103 04/06/2015   CALCIUM 10.0 04/06/2015   MG 2.1 04/05/2015    Pain Modulating Vitamins No results found for: VD25OH, [id number], [id number], [id number], 25OHVITD1, 25OHVITD2, 25OHVITD3, VITAMINB12  Coagulation Parameters Lab Results  Component Value Date  INR 1.05 03/06/2015   LABPROT 13.9 03/06/2015   APTT 33 03/06/2015   PLT 409 04/06/2015    Cardiovascular Lab Results  Component Value Date   HGB 14.2 04/06/2015   HCT 43.0 04/06/2015    Note: Lab results reviewed.  Recent Diagnostic Imaging  No results found.  Meds  The patient has a current medication list which includes the following prescription(s): albuterol, alprazolam, bupropion, fluticasone, fluticasone-salmeterol, linzess, loratadine, metoprolol succinate, montelukast, omeprazole, ondansetron,  oxycodone hcl, oxycodone hcl, oxycodone hcl, aerochamber mv, and valacyclovir.  Current Outpatient Prescriptions on File Prior to Visit  Medication Sig  . albuterol (PROVENTIL HFA;VENTOLIN HFA) 108 (90 BASE) MCG/ACT inhaler Inhale 2 puffs into the lungs every 6 (six) hours as needed for wheezing.  Marland Kitchen ALPRAZolam (XANAX) 1 MG tablet TAKE 1/2 A TABLET BY MOUTH EVERY MORNING, TAKE 1/2 A TABLET AT LUNCH AND 2 TABLETS AT BEDTIME  . buPROPion (WELLBUTRIN SR) 150 MG 12 hr tablet TAKE 1 TABLET BY MOUTH TWICE A DAY  . fluticasone (FLONASE) 50 MCG/ACT nasal spray Place 2 sprays into the nose daily.  . Fluticasone-Salmeterol (ADVAIR DISKUS) 500-50 MCG/DOSE AEPB Inhale 1 puff into the lungs 2 (two) times daily.  Marland Kitchen LINZESS 290 MCG CAPS capsule TAKE ONE CAPSULE BY MOUTH EVERY DAY  . loratadine (CLARITIN REDITABS) 10 MG dissolvable tablet Take 1 tablet (10 mg total) by mouth daily.  . metoprolol succinate (TOPROL-XL) 50 MG 24 hr tablet TAKE 1 TABLET BY MOUTH EVERY DAY  . montelukast (SINGULAIR) 10 MG tablet TAKE 1 TABLET BY MOUTH EVERY DAY  . omeprazole (PRILOSEC) 20 MG capsule TAKE ONE CAPSULE BY MOUTH TWICE A DAY  . ondansetron (ZOFRAN) 4 MG tablet Take 1 tablet (4 mg total) by mouth every 8 (eight) hours as needed for nausea or vomiting.  Marland Kitchen Spacer/Aero-Holding Chambers (AEROCHAMBER MV) inhaler by Does not apply route.  . valACYclovir (VALTREX) 1000 MG tablet TAKE TWO TABLETS BY MOUTH TWICE DAILY FOR ONE DAY FEVER BLISTER (Patient taking differently: as needed. TAKE TWO TABLETS BY MOUTH TWICE DAILY FOR ONE DAY FEVER BLISTER)   No current facility-administered medications on file prior to visit.     ROS  Constitutional: Denies any fever or chills Gastrointestinal: No reported hemesis, hematochezia, vomiting, or acute GI distress Musculoskeletal: Denies any acute onset joint swelling, redness, loss of ROM, or weakness Neurological: No reported episodes of acute onset apraxia, aphasia, dysarthria, agnosia,  amnesia, paralysis, loss of coordination, or loss of consciousness  Allergies  [name] is allergic to amoxicillin; clindamycin; codeine; sulfa antibiotics; shellfish allergy; decadron [dexamethasone]; papaya derivatives; prednisone; betadine [povidone iodine]; chlorhexidine; clarithromycin; clindamycin/lincomycin; and other.  [place]  Medical:  [name]  has a past medical history of Abnormal EKG; Anemia; Anxiety; Asthma; Cephalalgia (08/18/2014); Cervical disc disease (08/18/2014); Chronic headaches; DDD (degenerative disc disease); Depression; GERD (gastroesophageal reflux disease); H/O arthrodesis (08/18/2014); Headache(784.0); History of blood transfusion; History of cervical spinal surgery (01/04/2015); History of kidney stones; Hypertension; Inverted T wave; Narrowing of intervertebral disc space (08/18/2014); Pain; Pneumonia; PONV (postoperative nausea and vomiting); Right foot drop; and Right hip arthralgia (08/18/2014). Family: family history includes Aneurysm in her maternal grandmother and mother; Breast cancer (age of onset: 13) in her paternal grandmother. Surgical:  has a past surgical history that includes RIGHT HIP ARTHROSCOPY FOR LABRAL TEAR (ABOUT 2010); Anterior fusion cervical spine (MAY 2012); Nasal septum surgery (MARCH 2013); Back surgery (2009); Tonsillectomy; Abdominal hysterectomy; DIAGNOSTIC LAPAROSCOPIES - MULTIPLE FOR ENDOMETRIOSIS; Cholecystectomy; Hip arthroscopy (09/20/2011); Shoulder arthroscopy (05-06-12); Carpal tunnel  release (05-06-12); Total hip arthroplasty (Right, 05/13/2012); Anterior cervical decomp/discectomy fusion (N/A, 10/30/2012); Posterior cervical fusion/foraminotomy (N/A, 04/16/2013); Breast biopsy (Right, 2008); and Breast excisional biopsy (Left, 1998). Tobacco:  reports that she has never smoked. She has never used smokeless tobacco. Alcohol:  reports that she does not drink alcohol. Drug:  reports that she does not use drugs.  Constitutional Exam  Vitals: Blood  pressure 129/80, pulse (!) 53, temperature 98.4 F (36.9 C), temperature source Oral, resp. rate 16, height 5\' 4"  (1.626 m), weight 155 lb (70.3 kg), SpO2 99 %. General appearance: Well nourished, well developed, and well hydrated. In no acute distress Calculated BMI/Body habitus: Body mass index is 26.61 kg/m.       Psych/Mental status: Alert and oriented x 3 (person, place, & time) Eyes: PERLA Respiratory: No evidence of acute respiratory distress  Cervical Spine Exam  Inspection: No masses, redness, or swelling Alignment: Symmetrical ROM: Functional: ROM is within functional limits [facility]) Stability: No instability detected Muscle strength & Tone: Functionally intact Sensory: Unimpaired Palpation: No complaints of tenderness  Upper Extremity (UE) Exam    Side: Right upper extremity  Side: Left upper extremity  Inspection: No masses, redness, swelling, or asymmetry  Inspection: No masses, redness, swelling, or asymmetry  ROM:  ROM:  Functional: ROM is within functional limits [facility])        Functional: ROM is within functional limits [facility])        Muscle strength & Tone: Functionally intact  Muscle strength & Tone: Functionally intact  Sensory: Unimpaired  Sensory: Unimpaired  Palpation: No complaints of tenderness  Palpation: No complaints of tenderness   Thoracic Spine Exam  Inspection: No masses, redness, or swelling Alignment: Symmetrical ROM: Functional: ROM is within functional limits [facility]) Stability: No instability detected Sensory: Unimpaired Muscle strength & Tone: Functionally intact Palpation: No complaints of tenderness  Lumbar Spine Exam  Inspection: No masses, redness, or swelling Alignment: Symmetrical ROM: Functional: ROM is within functional limits [facility]) Stability: No instability detected Muscle strength & Tone: Functionally intact Sensory: Unimpaired Palpation: No complaints of tenderness Provocative Tests: Lumbar Hyperextension and rotation test:  provocative test deferred today       Patrick's Maneuver: provocative test deferred today              Gait & Posture Assessment  Ambulation: Unassisted Gait: Unaffected Posture: WNL   Lower Extremity Exam    Side: Right lower extremity  Side: Left lower extremity  Inspection: No masses, redness, swelling, or asymmetry ROM:  Inspection: No masses, redness, swelling, or asymmetry ROM:  Functional: ROM is within functional limits [facility])        Functional: ROM is within functional limits [facility])        Muscle strength & Tone: Functionally intact  Muscle strength & Tone: Functionally intact  Sensory: Unimpaired  Sensory: Unimpaired  Palpation: No complaints of tenderness  Palpation: No complaints of tenderness   Assessment & Plan  Primary Diagnosis & Pertinent Problem List: The primary encounter diagnosis was Chronic pain. Diagnoses of Encounter for therapeutic drug level monitoring, Long term current use of opiate analgesic, Opiate use (60 MME/Day), Chronic hip pain, unspecified laterality, Chronic shoulder pain (Location of Secondary source of pain) (Bilateral) (L>R), and Chronic low back pain (Location of Tertiary source of pain) (Bilateral) (L>R) were also pertinent to this visit.  Visit Diagnosis: 1. Chronic pain   2. Encounter for therapeutic drug level monitoring   3. Long term current use of opiate analgesic   4.  Opiate use (60 MME/Day)   5. Chronic hip pain, unspecified laterality   6. Chronic shoulder pain (Location of Secondary source of pain) (Bilateral) (L>R)   7. Chronic low back pain (Location of Tertiary source of pain) (Bilateral) (L>R)     Problems updated and reviewed during this visit: No problems updated.  Problem-specific Plan(s): No problem-specific Assessment & Plan notes found for this encounter.  No new Assessment & Plan notes have been filed under this hospital service since the last note was generated. Service: Pain Management   Plan of Care   Problem  List Items Addressed This Visit      High   Chronic hip pain (Location of Primary Source of Pain) (Bilateral) (L>R) (Chronic)   Relevant Medications   Oxycodone HCl 10 MG TABS   Oxycodone HCl 10 MG TABS   Oxycodone HCl 10 MG TABS   Chronic low back pain (Location of Tertiary source of pain) (Bilateral) (L>R) (Chronic)   Relevant Medications   Oxycodone HCl 10 MG TABS   Oxycodone HCl 10 MG TABS   Oxycodone HCl 10 MG TABS   Chronic pain - Primary (Chronic)   Relevant Medications   Oxycodone HCl 10 MG TABS   Oxycodone HCl 10 MG TABS   Oxycodone HCl 10 MG TABS   Chronic shoulder pain (Location of Secondary source of pain) (Bilateral) (L>R) (Chronic)     Medium   Encounter for therapeutic drug level monitoring   Long term current use of opiate analgesic (Chronic)   Relevant Orders   ToxASSURE Select 13 (MW), Urine   Opiate use (60 MME/Day) (Chronic)    Other Visit Diagnoses   None.      Pharmacotherapy (Medications Ordered): Meds ordered this encounter  Medications  . Oxycodone HCl 10 MG TABS    Sig: Take 1 tablet (10 mg total) by mouth every 6 (six) hours as needed.    Dispense:  120 tablet    Refill:  0    Do not place this medication, or any other prescription from our practice, on "Automatic Refill". Patient may have prescription filled one day early if pharmacy is closed on scheduled refill date. Do not fill until: 10/07/15 To last until: 11/06/15  . Oxycodone HCl 10 MG TABS    Sig: Take 1 tablet (10 mg total) by mouth every 6 (six) hours as needed.    Dispense:  120 tablet    Refill:  0    Do not place this medication, or any other prescription from our practice, on "Automatic Refill". Patient may have prescription filled one day early if pharmacy is closed on scheduled refill date. Do not fill until: 11/06/15 To last until: 12/06/15  . Oxycodone HCl 10 MG TABS    Sig: Take 1 tablet (10 mg total) by mouth every 6 (six) hours as needed.    Dispense:  120 tablet     Refill:  0    Do not place this medication, or any other prescription from our practice, on "Automatic Refill". Patient may have prescription filled one day early if pharmacy is closed on scheduled refill date. Do not fill until: 12/06/15 To last until: 01/05/16    [facility] & Procedure Ordered: Orders Placed This Encounter  Procedures  . ToxASSURE Select 13 (MW), Urine    Imaging Ordered: None  Interventional Therapies: Scheduled:  None at this time.    Considering:  Diagnostic bilateral lumbar facet block without steroids under fluoroscopic guidance and IV sedation.  PRN Procedures:  None at this time.    Referral(s) or Consult(s): None at this time.  New Prescriptions   No medications on file    Medications administered during this visit: [name] had no medications administered during this visit.  Requested PM Follow-up: Return in about 2 months (around 12/20/2015) for Med-Mgmt.  Future Appointments Date Time Provider [place]  12/21/2015 9:40 AM Milinda Pointer, MD [facility] None  12/30/2015 10:00 AM Mar Daring, PA-C [facility] None    Primary Care Physician: Margarita Rana, MD Location: [facility] Note by: Kathlen Brunswick. Dossie Arbour, M.D, DABA, DABAPM, DABPM, DABIPP, FIPP  Pain Score Disclaimer: We use the NRS-11 scale. This is a self-reported, subjective measurement of pain severity with only modest accuracy. It is used primarily to identify changes within a particular patient. It must be understood that outpatient pain scales are significantly less accurate that those used for research, where they can be applied under ideal controlled circumstances with minimal exposure to variables. In reality, the score is likely to be a combination of pain intensity and pain affect, where pain affect describes the degree of emotional arousal or changes in action readiness caused by the sensory experience of pain. Factors such as  social and work situation, setting, emotional state, anxiety levels, expectation, and prior pain experience may influence pain perception and show large inter-individual differences that may also be affected by time variables.  Patient instructions provided during this appointment: Patient Instructions  Pain Management Discharge Instructions  General Discharge Instructions :  If you need to reach your doctor call: Monday-Friday 8:00 am - 4:00 pm at [phone] or toll free [phone].  After clinic hours [phone] to have operator reach doctor.  Bring all of your medication bottles to all your appointments in the pain clinic.  To cancel or reschedule your appointment with Pain Management please remember to call 24 hours in advance to avoid a fee.  Refer to the educational materials which you have been given on: General Risks, I had my Procedure. Discharge Instructions, Post Sedation.  Post Procedure Instructions:  The drugs you were given will stay in your system until tomorrow, so for the next 24 hours you should not drive, make any legal decisions or drink any alcoholic beverages.  You may eat anything you prefer, but it is better to start with liquids then soups and crackers, and gradually work up to solid foods.  Please notify your doctor immediately if you have any unusual bleeding, trouble breathing or pain that is not related to your normal pain.  Depending on the type of procedure that was done, some parts of your body may feel week and/or numb.  This usually clears up by tonight or the next day.  Walk with the use of an assistive device or accompanied by an adult for the 24 hours.  You may use ice on the affected area for the first 24 hours.  Put ice in a Ziploc bag and cover with a towel and place against area 15 minutes on 15 minutes off.  You may switch to heat after 24 hours.

## 2015-10-07 NOTE — Patient Instructions (Signed)

## 2015-10-18 ENCOUNTER — Other Ambulatory Visit: Payer: Self-pay | Admitting: Family Medicine

## 2015-10-18 DIAGNOSIS — F32A Depression, unspecified: Secondary | ICD-10-CM

## 2015-10-18 DIAGNOSIS — F329 Major depressive disorder, single episode, unspecified: Secondary | ICD-10-CM

## 2015-10-18 NOTE — Telephone Encounter (Signed)
error 

## 2015-10-19 ENCOUNTER — Other Ambulatory Visit: Payer: Self-pay

## 2015-10-19 LAB — TOXASSURE SELECT 13 (MW), URINE: PDF: 0

## 2015-12-21 ENCOUNTER — Encounter: Payer: Medicare Other | Admitting: Pain Medicine

## 2015-12-22 ENCOUNTER — Encounter: Payer: Medicare Other | Admitting: Pain Medicine

## 2015-12-30 ENCOUNTER — Encounter: Payer: Self-pay | Admitting: Physician Assistant

## 2015-12-30 ENCOUNTER — Ambulatory Visit (INDEPENDENT_AMBULATORY_CARE_PROVIDER_SITE_OTHER): Payer: Medicare Other | Admitting: Physician Assistant

## 2015-12-30 VITALS — BP 120/80 | HR 60 | Temp 98.0°F | Resp 16 | Ht 64.0 in | Wt 155.0 lb

## 2015-12-30 DIAGNOSIS — Z136 Encounter for screening for cardiovascular disorders: Secondary | ICD-10-CM

## 2015-12-30 DIAGNOSIS — K2100 Gastro-esophageal reflux disease with esophagitis, without bleeding: Secondary | ICD-10-CM

## 2015-12-30 DIAGNOSIS — N62 Hypertrophy of breast: Secondary | ICD-10-CM

## 2015-12-30 DIAGNOSIS — Z0000 Encounter for general adult medical examination without abnormal findings: Secondary | ICD-10-CM

## 2015-12-30 DIAGNOSIS — K5730 Diverticulosis of large intestine without perforation or abscess without bleeding: Secondary | ICD-10-CM | POA: Diagnosis not present

## 2015-12-30 DIAGNOSIS — Z13220 Encounter for screening for lipoid disorders: Secondary | ICD-10-CM

## 2015-12-30 DIAGNOSIS — Z1239 Encounter for other screening for malignant neoplasm of breast: Secondary | ICD-10-CM

## 2015-12-30 DIAGNOSIS — Z8701 Personal history of pneumonia (recurrent): Secondary | ICD-10-CM

## 2015-12-30 DIAGNOSIS — F331 Major depressive disorder, recurrent, moderate: Secondary | ICD-10-CM

## 2015-12-30 DIAGNOSIS — K266 Chronic or unspecified duodenal ulcer with both hemorrhage and perforation: Secondary | ICD-10-CM

## 2015-12-30 DIAGNOSIS — Z833 Family history of diabetes mellitus: Secondary | ICD-10-CM

## 2015-12-30 DIAGNOSIS — K589 Irritable bowel syndrome without diarrhea: Secondary | ICD-10-CM

## 2015-12-30 DIAGNOSIS — Z23 Encounter for immunization: Secondary | ICD-10-CM | POA: Diagnosis not present

## 2015-12-30 DIAGNOSIS — Z1231 Encounter for screening mammogram for malignant neoplasm of breast: Secondary | ICD-10-CM

## 2015-12-30 DIAGNOSIS — Z1159 Encounter for screening for other viral diseases: Secondary | ICD-10-CM

## 2015-12-30 MED ORDER — ESCITALOPRAM OXALATE 10 MG PO TABS: ORAL_TABLET | ORAL | 1 refills | Status: DC

## 2015-12-30 NOTE — Progress Notes (Signed)
Patient: [name], Female    DOB: [birth year], 55 y.o.   MRN: [id number] Visit Date: 12/30/2015  Today's Provider: Mar Daring, PA-C   Chief Complaint  Patient presents with  . Annual Exam   Subjective:    Annual physical exam [name] is a 55 y.o. female who presents today for health maintenance and complete physical. She feels well. She reports exercising none. She reports she is sleeping fairly well.  03/30/10 CPE 02/16/15 Mammogram-BI-RADS 1  She does have acute complaints of worsening reflux and feelings of bloating in the evening. She does have a history of IBS, diverticulosis, and GERD with duodenal ulcer. She has not been seen by GI for 7 years and would like to establish with another GI provider.   She is also complaining of worsening pain in her shoulders and upper back secondary to large breast. She has chronic neck and back pain at baseline due to injuries and neck surgeries, but states that recently her upper back and shoulders have really started bothering her secondary to the size of her breasts. She has had abnormal mammograms in the past requiring 2 lumpectomies that were negative. She is interested in being considered for reduction.  She is also having worsening depression. She is on wellbutrin. She relates this to stress and anxiety with her family. She also reports that she is having nightmares at night that are worsened by these stressors as well.  -----------------------------------------------------------------   Review of Systems  Constitutional: Positive for diaphoresis and fatigue. Negative for chills and fever.  HENT: Positive for postnasal drip, sneezing and trouble swallowing. Negative for congestion, ear discharge, ear pain, hearing loss, nosebleeds, sore throat and tinnitus.   Eyes: Negative for photophobia, pain, discharge and redness.  Respiratory: Positive for choking, chest tightness and shortness of breath. Negative  for cough, wheezing and stridor.   Cardiovascular: Positive for palpitations. Negative for chest pain and leg swelling.  Gastrointestinal: Positive for abdominal distention and nausea. Negative for abdominal pain, blood in stool, constipation, diarrhea and vomiting.  Endocrine: Positive for cold intolerance and heat intolerance. Negative for polydipsia.  Genitourinary: Positive for vaginal pain. Negative for dysuria, flank pain, frequency, hematuria and urgency.  Musculoskeletal: Positive for arthralgias, back pain, gait problem, myalgias, neck pain and neck stiffness.  Skin: Negative for rash.  Allergic/Immunologic: Positive for environmental allergies and food allergies.  Neurological: Positive for weakness, numbness and headaches. Negative for dizziness, tremors and seizures.  Hematological: Does not bruise/bleed easily.  Psychiatric/Behavioral: Positive for agitation, confusion, decreased concentration, dysphoric mood and sleep disturbance. Negative for hallucinations and suicidal ideas. The patient is nervous/anxious.     Social History      She  reports that she has never smoked. She has never used smokeless tobacco. She reports that she does not drink alcohol or use drugs.       Social History   Social History  . Marital status: Divorced    Spouse name: N/A  . Number of children: 2  . Years of education: N/A   Social History Main Topics  . Smoking status: Never Smoker  . Smokeless tobacco: Never Used  . Alcohol use No  . Drug use: No  . Sexual activity: Yes   Other Topics Concern  . None   Social History Narrative  . None    Past Medical History:  Diagnosis Date  . Abnormal EKG    HX OF INVERTED T WAVES ON EKG, PALPITATIONS, CHEST  PAINS-CARDIAC WORK UP DID NOT SHOW ANY HEART DISEASE  . Anemia    Iron Infusion-8 yrs ago  . Anxiety   . Asthma    INHALERS WITH FLARE -UPS  . Cephalalgia 08/18/2014  . Cervical disc disease 08/18/2014   Needs neck surgery.   . Chronic  headaches   . DDD (degenerative disc disease)    CERVICAL AND LUMBAR-CHRONIC PAIN, RT HIP LABRAL TEAR  . Depression    PT STATES A LOT OF STRESS IN HER LIFE  . GERD (gastroesophageal reflux disease)   . H/O arthrodesis 08/18/2014  . Headache(784.0)    AND NECK PAIN--STATES RECENT TEST SHOW CERVICAL DEGENERATION  . History of blood transfusion    s/p back surgery  . History of cervical spinal surgery 01/04/2015  . History of kidney stones   . Hypertension   . Inverted T wave   . Narrowing of intervertebral disc space 08/18/2014   Currently on disability.   . Pain    CHRONIC NECK AND BACK PAIN - LIMITED ROM NECK - S/P FUSIONS CERVICAL AND LUMBAR  . Pneumonia   . PONV (postoperative nausea and vomiting)    PT GIVES HX OF N&V AND FEVER WITH SURGERIES YEARS AGO--BUT NO PROBLEMS WITH MORE RECENT SURGERIES--STATES NOT MALIGNANT HYPERTHERMIA  . Right foot drop   . 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      Patient Active Problem List   Diagnosis Date Noted  . Cervicogenic headache 07/06/2015  . Chronic hip pain (Location of Primary Source of Pain) (Bilateral) (L>R) 07/05/2015  . 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 History of total hip replacement (THR) (Right) 07/05/2015  . Chronic shoulder radicular pain (Bilateral) (L>R) 07/05/2015  . Lumbar facet syndrome (Location of Tertiary source of pain) (Bilateral) (L>R) 07/05/2015  . Chronic low back pain (Location of Tertiary source of pain) (Bilateral) (L>R) 04/05/2015  . Chronic left shoulder pain 01/04/2015  . Chronic right hip pain 01/04/2015  . Encounter for therapeutic drug level monitoring 01/04/2015  . Long term current use of opiate analgesic 01/04/2015  . Long term prescription opiate use 01/04/2015  . Opiate use (60 MME/Day) 01/04/2015  . Chronic pain syndrome 01/04/2015  . Chronic pain 01/04/2015  . Steroid  intolerance 01/04/2015  . Cervical spine pain 01/04/2015  . Cervical spondylosis 01/04/2015  . Cervical facet arthropathy (bilateral) 01/04/2015  . History of cervical spinal surgery 01/04/2015  . Chronic shoulder pain (Location of Secondary source of pain) (Bilateral) (L>R) 01/04/2015  . 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 Lumbar spondylosis 01/04/2015  . Cervical facet syndrome (bilateral) 01/04/2015  . Chronic right sacroiliac joint pain 01/04/2015  . DOE (dyspnea on exertion) 09/03/2014  . Asthma, mild 08/18/2014  . Blurred vision 08/18/2014  . Benign paroxysmal positional nystagmus 08/18/2014  . Clinical depression 08/18/2014  . Fatigue 08/18/2014  . Acid reflux 08/18/2014  . Cannot sleep 08/18/2014  . Awareness of heartbeats 08/18/2014  . RAD (reactive airway disease) 08/18/2014  . Apnea, sleep 08/18/2014  . Orthostatic hypotension 04/22/2013  . Dizziness 04/22/2013  . Chronic neck pain 04/16/2013  . Postoperative anemia due to acute blood loss 05/14/2012  . Postop Hyponatremia 05/14/2012  . 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 Anxiety state 08/03/2003  . Colon, diverticulosis 07/15/2003  . Duodenal ulcer with hemorrhage and perforation ([place]) 04/27/2003  . IBS (  irritable bowel syndrome) 04/27/2003  . Esophagitis, reflux 02/11/2003  . 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 ABDOMINAL HYSTERECTOMY    . ANTERIOR CERVICAL DECOMP/DISCECTOMY FUSION N/A 10/30/2012   Procedure: ACDF C5-6, EXPLORATION AND HARDWARE REMOVAL C6-7;  Surgeon: Melina Schools, MD;  Location: [place];  Service: Orthopedics;  Laterality: N/A;  . ANTERIOR FUSION CERVICAL SPINE  MAY 2012   AT [facility]  . BACK SURGERY  2009   LUMBAR FUSION WITH RODS   . BREAST BIOPSY Right 2008   benign  . BREAST EXCISIONAL BIOPSY Left 1998   benign  . CARPAL TUNNEL RELEASE  05-06-12   Right  .  CHOLECYSTECTOMY    . DIAGNOSTIC LAPAROSCOPIES - MULTIPLE FOR ENDOMETRIOSIS    . HIP ARTHROSCOPY  09/20/2011   Procedure: ARTHROSCOPY HIP;  Surgeon: Gearlean Alf, MD;  Location: [facility];  Service: Orthopedics;  Laterality: Right;  Right Hip Scope with Labral Debridement  . NASAL SEPTUM SURGERY  MARCH 2013   IN [place]  . POSTERIOR CERVICAL FUSION/FORAMINOTOMY N/A 04/16/2013   Procedure: REMOVAL CERVICAL PLATES AND INTERBODY CAGE/POSTERIOR CERVICAL SPINAL FUSION C4 - C6/C5 CORPECTOMY/C4 - C6 FUSION WITH ILIAC CREST BONE GRAFT;  Surgeon: Melina Schools, MD;  Location: [place];  Service: Orthopedics;  Laterality: N/A;  . RIGHT HIP ARTHROSCOPY FOR LABRAL TEAR  ABOUT 2010   2012 also  . SHOULDER ARTHROSCOPY  05-06-12   bone spur  . TONSILLECTOMY     AS A CHILD  . TOTAL HIP ARTHROPLASTY Right 05/13/2012   Procedure: TOTAL HIP ARTHROPLASTY ANTERIOR APPROACH;  Surgeon: Gearlean Alf, MD;  Location: [facility];  Service: Orthopedics;  Laterality: Right;    Family History        Family Status  Relation Status  . Mother Deceased  . Maternal Grandmother Deceased  . Father Deceased  . Paternal Grandmother         Her family history includes Aneurysm in her maternal grandmother and mother; Breast cancer (age of onset: 5) in her paternal grandmother.    Allergies  Allergen Reactions  . Amoxicillin Hives  . Clindamycin Hives  . Codeine Hives  . Sulfa Antibiotics Nausea And Vomiting and Hives       . Shellfish Allergy Hives  . Decadron [Dexamethasone] Other (See Comments)    Hot flashes, insomnia, "manic"  . Papaya Derivatives Hives  . Prednisone     High blood pressure, flushed, mood changes, heart palpitations  . Betadine [Povidone Iodine] Hives       . Chlorhexidine Hives       . Clarithromycin Hives  . Clindamycin/Lincomycin Hives  . Other Hives     Mango     Current Meds  Medication Sig  . albuterol (PROVENTIL HFA;VENTOLIN HFA) 108 (90 BASE) MCG/ACT inhaler Inhale 2 puffs into  the lungs every 6 (six) hours as needed for wheezing.  Marland Kitchen ALPRAZolam (XANAX) 1 MG tablet TAKE 1/2 A TABLET BY MOUTH EVERY MORNING, TAKE 1/2 A TABLET AT LUNCH AND 2 TABLETS AT BEDTIME  . buPROPion (WELLBUTRIN SR) 150 MG 12 hr tablet TAKE 1 TABLET BY MOUTH TWICE A DAY  . fluticasone (FLONASE) 50 MCG/ACT nasal spray Place 2 sprays into the nose daily.  . Fluticasone-Salmeterol (ADVAIR DISKUS) 500-50 MCG/DOSE AEPB Inhale 1 puff into the lungs 2 (two) times daily.  Marland Kitchen LINZESS 290 MCG CAPS capsule TAKE ONE CAPSULE BY MOUTH EVERY DAY  . loratadine (CLARITIN REDITABS) 10 MG  dissolvable tablet Take 1 tablet (10 mg total) by mouth daily.  . metoprolol succinate (TOPROL-XL) 50 MG 24 hr tablet TAKE 1 TABLET BY MOUTH EVERY DAY  . montelukast (SINGULAIR) 10 MG tablet TAKE 1 TABLET BY MOUTH EVERY DAY  . omeprazole (PRILOSEC) 20 MG capsule TAKE ONE CAPSULE BY MOUTH TWICE A DAY  . ondansetron (ZOFRAN) 4 MG tablet Take 1 tablet (4 mg total) by mouth every 8 (eight) hours as needed for nausea or vomiting.  . Oxycodone HCl 10 MG TABS Take 1 tablet (10 mg total) by mouth every 6 (six) hours as needed.  Marland Kitchen Spacer/Aero-Holding Chambers (AEROCHAMBER MV) inhaler by Does not apply route.  . valACYclovir (VALTREX) 1000 MG tablet TAKE TWO TABLETS BY MOUTH TWICE DAILY FOR ONE DAY FEVER BLISTER (Patient taking differently: as needed. TAKE TWO TABLETS BY MOUTH TWICE DAILY FOR ONE DAY FEVER BLISTER)    Patient Care Team: Mar Daring, PA-C as PCP - General (Family Medicine)     Objective:   Vitals: BP 120/80 (BP Location: Left Arm, Patient Position: Sitting, Cuff Size: Large)   Pulse 60   Temp 98 F (36.7 C) (Oral)   Resp 16   Ht 5\' 4"  (1.626 m)   Wt 155 lb (70.3 kg)   BMI 26.61 kg/m    Physical Exam  Constitutional: She is oriented to person, place, and time. She appears well-developed and well-nourished. No distress.  HENT:  Head: Normocephalic and atraumatic.  Right Ear: Hearing, tympanic membrane,  external ear and ear canal normal.  Left Ear: Hearing, tympanic membrane, external ear and ear canal normal.  Nose: Nose normal.  Mouth/Throat: Uvula is midline, oropharynx is clear and moist and mucous membranes are normal. No oropharyngeal exudate.  Eyes: Conjunctivae, EOM and lids are normal. Pupils are equal, round, and reactive to light. Right eye exhibits no discharge. Left eye exhibits no discharge. No scleral icterus.  Neck: Trachea normal and normal range of motion. Neck supple. No JVD present. Carotid bruit is not present. No tracheal deviation present. No thyroid mass and no thyromegaly present.  Cardiovascular: Normal rate, regular rhythm, normal heart sounds and intact distal pulses.  Exam reveals no gallop and no friction rub.   No murmur heard. Pulmonary/Chest: Effort normal and breath sounds normal. No respiratory distress. She has no wheezes. She has no rales. She exhibits no tenderness. Right breast exhibits no inverted nipple, no mass, no nipple discharge, no skin change and no tenderness. Left breast exhibits no inverted nipple, no mass, no nipple discharge, no skin change and no tenderness. Breasts are symmetrical.  Abdominal: Soft. Normal appearance and bowel sounds are normal. She exhibits no distension and no mass. There is no hepatosplenomegaly. There is no tenderness. There is no rebound and no guarding.  Genitourinary: No breast swelling, tenderness or discharge.  Musculoskeletal: Normal range of motion. She exhibits no edema or tenderness.  Lymphadenopathy:    She has no cervical adenopathy.    She has no axillary adenopathy.  Neurological: She is alert and oriented to person, place, and time. She has normal strength. No cranial nerve deficit.  Skin: Skin is warm, dry and intact. No rash noted. She is not diaphoretic.  Psychiatric: She has a normal mood and affect. Her speech is normal and behavior is normal. Judgment and thought content normal. Cognition and memory are  normal.  Vitals reviewed.    Depression Screen PHQ 2/9 Scores 12/30/2015 10/07/2015 07/02/2015 04/05/2015  PHQ - 2 Score 6 0 0 1  PHQ- 9 Score 21 - - -  Exception Documentation - Patient refusal - Other- indicate reason in comment box  Not completed - - - Pt takes Wellbutrin and see MD   Functional Status Survey: Is the patient deaf or have difficulty hearing?: No Does the patient have difficulty seeing, even when wearing glasses/contacts?: No Does the patient have difficulty concentrating, remembering, or making decisions?: Yes Does the patient have difficulty walking or climbing stairs?: Yes Does the patient have difficulty dressing or bathing?: No Does the patient have difficulty doing errands alone such as visiting a doctor's office or shopping?: No  Fall Risk  12/30/2015 10/07/2015 07/02/2015 04/05/2015 01/04/2015  Falls in the past year? Yes Yes Yes Yes No  Number falls in past yr: 2 or more 2 or more 2 or more 2 or more -  Injury with Fall? No No No No -  Risk Factor Category  - High Fall Risk High Fall Risk High Fall Risk -  Risk for fall due to : - Impaired balance/gait;Impaired mobility;History of fall(s) - History of fall(s);Impaired balance/gait -  Follow up - - Falls prevention discussed;Falls evaluation completed Education provided;Falls prevention discussed;Follow up appointment -   Cognitive Testing - 6-CIT  Correct? Score   What year is it? yes 0 0 or 4  What month is it? yes 0 0 or 3  Memorize:    Pia Mau,  41,  [address],  [place],      What time is it? (within 1 hour) yes 0 0 or 3  Count backwards from 20 yes 0 0, 2, or 4  Name the months of the year yes 0 0, 2, or 4  Repeat name & address above no 4 0, 2, 4, 6, 8, or 10       TOTAL SCORE  4/28   Interpretation:  Normal  Normal (0-7) Abnormal (8-28)     Assessment & Plan:     Routine Health Maintenance and Physical Exam  Exercise Activities and Dietary recommendations Goals    None       There  is no immunization history on file for this patient.  Health Maintenance  Topic Date Due  . Hepatitis C Screening  10/25/1960  . HIV Screening  06/01/1974  . TETANUS/TDAP  06/01/1978  . PAP SMEAR  05/31/1980  . COLONOSCOPY  05/31/2009  . INFLUENZA VACCINE  10/12/2015  . MAMMOGRAM  02/15/2017      Discussed health benefits of physical activity, and encouraged her to engage in regular exercise appropriate for her age and condition.    1. Annual physical exam Normal physical exam today. Will check labs as below and f/u pending lab results. If labs are stable and WNL she will not need to have these rechecked for one year at her next annual physical exam. She is to call the office in the meantime if she has any acute issue, questions or concerns. - CBC with Differential - Comprehensive metabolic panel - TSH  2. Breast cancer screening Breast exam today was normal. There is no family history of breast cancer. She does perform regular self breast exams. Mammogram was ordered as below. Information for [facility] was given to patient so she may schedule her mammogram at her convenience. - Mammogram Digital Screening; Future  3. Encounter for lipid screening for cardiovascular disease Will check labs as below and f/u pending results. - Lipid panel  4. Family history of diabetes mellitus (DM) Will check labs as  below and f/u pending results. - Hemoglobin A1c  5. Moderate episode of recurrent major depressive disorder (HCC) Worsening symptoms. Will add lexapro to wellbutrin as below. Has used zoloft and did not tolerate. I will see her back in 4 weeks to see how she is tolerating this medication. - escitalopram (LEXAPRO) 10 MG tablet; Take 1/2 tab PO q h.s. X 1 week then increase to 1 tab PO q h.s.  Dispense: 30 tablet; Refill: 1  6. H/O: pneumonia Pneumococcal-23 vaccine given to patient without complications. Patient sat for 15 minutes after administration and was tolerated  well without adverse effects. She had multiple episodes of pneumonia last year.  - Pneumococcal Polysaccharide 23  7. Need for influenza vaccination Flu vaccine given today without complication. Patient sat upright for 15 minutes to check for adverse reaction before being released. - Flu Vaccine QUAD 36+ mos IM (Fluarix)  8. Need for pneumococcal vaccination Pneumococcal-23 vaccine given to patient without complications. Patient sat for 15 minutes after administration and was tolerated well without adverse effects. She had multiple episodes of pneumonia last year. - Pneumococcal Polysaccharide 23  9. Need for hepatitis C screening test - Hepatitis C antibody screen  10. Colon, diverticulosis Had colonoscopy about 7 years ago. Was told she has diverticulosis and areas of irritation from IBS. She was to have repeat colonoscopy in 5 years but had not went back due to personal reasons of an incident that happened at the center she went to. She has been also having increasing acid reflux at night when she lies down and bloating in the epigastric region. She does have a history of duodenal ulcer. She has difficulty swallowing secondary to cervical spine procedures and the cervical spine cage she has in place. She is currently on Linzess 230mcg, zofran as needed and prilosec 20mg  BID. Will refer her to GI for establishing care and further evaluation of these issues. - Ambulatory referral to Gastroenterology  11. Duodenal ulcer with hemorrhage and perforation ([place]) See above medical treatment plan. - Ambulatory referral to Gastroenterology  12. Irritable bowel syndrome, unspecified type See above medical treatment plan. - Ambulatory referral to Gastroenterology  13. Esophagitis, reflux See above medical treatment plan. - Ambulatory referral to Gastroenterology  14. Large breasts Will also refer to plastic surgery for consideration of breast reduction. She is having worsening acute pain in the  neck, upper back, thoracic back and shoulders bilaterally from being large breasted. Referral placed for consideration and to see if medically necessary. - Ambulatory referral to Plastic Surgery  --------------------------------------------------------------------    Mar Daring, PA-C  [employer]

## 2015-12-30 NOTE — Patient Instructions (Addendum)
Escitalopram tablets What is this medicine? ESCITALOPRAM (es sye TAL oh pram) is used to treat depression and certain types of anxiety. This medicine may be used for other purposes; ask your health care provider or pharmacist if you have questions. What should I tell my health care provider before I take this medicine? They need to know if you have any of these conditions: -bipolar disorder or a family history of bipolar disorder -diabetes -glaucoma -heart disease -kidney or liver disease -receiving electroconvulsive therapy -seizures (convulsions) -suicidal thoughts, plans, or attempt by you or a family member -an unusual or allergic reaction to escitalopram, the related drug citalopram, other medicines, foods, dyes, or preservatives -pregnant or trying to become pregnant -breast-feeding How should I use this medicine? Take this medicine by mouth with a glass of water. Follow the directions on the prescription label. You can take it with or without food. If it upsets your stomach, take it with food. Take your medicine at regular intervals. Do not take it more often than directed. Do not stop taking this medicine suddenly except upon the advice of your doctor. Stopping this medicine too quickly may cause serious side effects or your condition may worsen. A special MedGuide will be given to you by the pharmacist with each prescription and refill. Be sure to read this information carefully each time. Talk to your pediatrician regarding the use of this medicine in children. Special care may be needed. Overdosage: If you think you have taken too much of this medicine contact a poison control center or emergency room at once. NOTE: This medicine is only for you. Do not share this medicine with others. What if I miss a dose? If you miss a dose, take it as soon as you can. If it is almost time for your next dose, take only that dose. Do not take double or extra doses. What may interact with this  medicine? Do not take this medicine with any of the following medications: -certain medicines for fungal infections like fluconazole, itraconazole, ketoconazole, posaconazole, voriconazole -cisapride -citalopram -dofetilide -dronedarone -linezolid -MAOIs like Carbex, Eldepryl, Marplan, Nardil, and Parnate -methylene blue (injected into a vein) -pimozide -thioridazine -ziprasidone This medicine may also interact with the following medications: -alcohol -aspirin and aspirin-like medicines -carbamazepine -certain medicines for depression, anxiety, or psychotic disturbances -certain medicines for migraine headache like almotriptan, eletriptan, frovatriptan, naratriptan, rizatriptan, sumatriptan, zolmitriptan -certain medicines for sleep -certain medicines that treat or prevent blood clots like warfarin, enoxaparin, dalteparin -cimetidine -diuretics -fentanyl -furazolidone -isoniazid -lithium -metoprolol -NSAIDs, medicines for pain and inflammation, like ibuprofen or naproxen -other medicines that prolong the QT interval (cause an abnormal heart rhythm) -procarbazine -rasagiline -supplements like St. John's wort, kava kava, valerian -tramadol -tryptophan This list may not describe all possible interactions. Give your health care provider a list of all the medicines, herbs, non-prescription drugs, or dietary supplements you use. Also tell them if you smoke, drink alcohol, or use illegal drugs. Some items may interact with your medicine. What should I watch for while using this medicine? Tell your doctor if your symptoms do not get better or if they get worse. Visit your doctor or health care professional for regular checks on your progress. Because it may take several weeks to see the full effects of this medicine, it is important to continue your treatment as prescribed by your doctor. Patients and their families should watch out for new or worsening thoughts of suicide or  depression. Also watch out for sudden changes in feelings such  as feeling anxious, agitated, panicky, irritable, hostile, aggressive, impulsive, severely restless, overly excited and hyperactive, or not being able to sleep. If this happens, especially at the beginning of treatment or after a change in dose, call your health care professional. Dennis Bast may get drowsy or dizzy. Do not drive, use machinery, or do anything that needs mental alertness until you know how this medicine affects you. Do not stand or sit up quickly, especially if you are an older patient. This reduces the risk of dizzy or fainting spells. Alcohol may interfere with the effect of this medicine. Avoid alcoholic drinks. Your mouth may get dry. Chewing sugarless gum or sucking hard candy, and drinking plenty of water may help. Contact your doctor if the problem does not go away or is severe. What side effects may I notice from receiving this medicine? Side effects that you should report to your doctor or health care professional as soon as possible: -allergic reactions like skin rash, itching or hives, swelling of the face, lips, or tongue -confusion -feeling faint or lightheaded, falls -fast talking and excited feelings or actions that are out of control -hallucination, loss of contact with reality -seizures -suicidal thoughts or other mood changes -unusual bleeding or bruising Side effects that usually do not require medical attention (report to your doctor or health care professional if they continue or are bothersome): -blurred vision -changes in appetite -change in sex drive or performance -headache -increased sweating -nausea This list may not describe all possible side effects. Call your doctor for medical advice about side effects. You may report side effects to FDA at 1-800-FDA-1088. Where should I keep my medicine? Keep out of reach of children. Store at room temperature between 15 and 30 degrees C (59 and 86 degrees  F). Throw away any unused medicine after the expiration date. NOTE: This sheet is a summary. It may not cover all possible information. If you have questions about this medicine, talk to your doctor, pharmacist, or health care provider.    2016, Elsevier/[employer] Standard. (2012-09-24 12:32:55)   Health Maintenance, Female Adopting a healthy lifestyle and getting preventive care can go a long way to promote health and wellness. Talk with your health care provider about what schedule of regular examinations is right for you. This is a good chance for you to check in with your provider about disease prevention and staying healthy. In between checkups, there are plenty of things you can do on your own. Experts have done a lot of research about which lifestyle changes and preventive measures are most likely to keep you healthy. Ask your health care provider for more information. WEIGHT AND DIET  Eat a healthy diet  Be sure to include plenty of vegetables, fruits, low-fat dairy products, and lean protein.  Do not eat a lot of foods high in solid fats, added sugars, or salt.  Get regular exercise. This is one of the most important things you can do for your health.  Most adults should exercise for at least 150 minutes each week. The exercise should increase your heart rate and make you sweat (moderate-intensity exercise).  Most adults should also do strengthening exercises at least twice a week. This is in addition to the moderate-intensity exercise.  Maintain a healthy weight  Body mass index (BMI) is a measurement that can be used to identify possible weight problems. It estimates body fat based on height and weight. Your health care provider can help determine your BMI and help you achieve or maintain  a healthy weight.  For females 22 years of age and older:   A BMI below 18.5 is considered underweight.  A BMI of 18.5 to 24.9 is normal.  A BMI of 25 to 29.9 is considered overweight.  A  BMI of 30 and above is considered obese.  Watch levels of cholesterol and blood lipids  You should start having your blood tested for lipids and cholesterol at 56 years of age, then have this test every 5 years.  You may need to have your cholesterol levels checked more often if:  Your lipid or cholesterol levels are high.  You are older than 56 years of age.  You are at high risk for heart disease.  CANCER SCREENING   Lung Cancer  Lung cancer screening is recommended for adults 4-62 years old who are at high risk for lung cancer because of a history of smoking.  A yearly low-dose CT scan of the lungs is recommended for people who:  Currently smoke.  Have quit within the past 15 years.  Have at least a 30-pack-year history of smoking. A pack year is smoking an average of one pack of cigarettes a day for 1 year.  Yearly screening should continue until it has been 15 years since you quit.  Yearly screening should stop if you develop a health problem that would prevent you from having lung cancer treatment.  Breast Cancer  Practice breast self-awareness. This means understanding how your breasts normally appear and feel.  It also means doing regular breast self-exams. Let your health care provider know about any changes, no matter how small.  If you are in your 20s or 30s, you should have a clinical breast exam (CBE) by a health care provider every 1-3 years as part of a regular health exam.  If you are 70 or older, have a CBE every year. Also consider having a breast X-ray (mammogram) every year.  If you have a family history of breast cancer, talk to your health care provider about genetic screening.  If you are at high risk for breast cancer, talk to your health care provider about having an MRI and a mammogram every year.  Breast cancer gene (BRCA) assessment is recommended for women who have family members with BRCA-related cancers. BRCA-related cancers  include:  Breast.  Ovarian.  Tubal.  Peritoneal cancers.  Results of the assessment will determine the need for genetic counseling and BRCA1 and BRCA2 testing. Cervical Cancer Your health care provider may recommend that you be screened regularly for cancer of the pelvic organs (ovaries, uterus, and vagina). This screening involves a pelvic examination, including checking for microscopic changes to the surface of your cervix (Pap test). You may be encouraged to have this screening done every 3 years, beginning at age 49.  For women ages 56-65, health care providers may recommend pelvic exams and Pap testing every 3 years, or they may recommend the Pap and pelvic exam, combined with testing for human papilloma virus (HPV), every 5 years. Some types of HPV increase your risk of cervical cancer. Testing for HPV may also be done on women of any age with unclear Pap test results.  Other health care providers may not recommend any screening for nonpregnant women who are considered low risk for pelvic cancer and who do not have symptoms. Ask your health care provider if a screening pelvic exam is right for you.  If you have had past treatment for cervical cancer or a condition that could  lead to cancer, you need Pap tests and screening for cancer for at least 20 years after your treatment. If Pap tests have been discontinued, your risk factors (such as having a new sexual partner) need to be reassessed to determine if screening should resume. Some women have medical problems that increase the chance of getting cervical cancer. In these cases, your health care provider may recommend more frequent screening and Pap tests. Colorectal Cancer  This type of cancer can be detected and often prevented.  Routine colorectal cancer screening usually begins at 56 years of age and continues through 56 years of age.  Your health care provider may recommend screening at an earlier age if you have risk factors for  colon cancer.  Your health care provider may also recommend using home test kits to check for hidden blood in the stool.  A small camera at the end of a tube can be used to examine your colon directly (sigmoidoscopy or colonoscopy). This is done to check for the earliest forms of colorectal cancer.  Routine screening usually begins at age 24.  Direct examination of the colon should be repeated every 5-10 years through 56 years of age. However, you may need to be screened more often if early forms of precancerous polyps or small growths are found. Skin Cancer  Check your skin from head to toe regularly.  Tell your health care provider about any new moles or changes in moles, especially if there is a change in a mole's shape or color.  Also tell your health care provider if you have a mole that is larger than the size of a pencil eraser.  Always use sunscreen. Apply sunscreen liberally and repeatedly throughout the day.  Protect yourself by wearing long sleeves, pants, a wide-brimmed hat, and sunglasses whenever you are outside. HEART DISEASE, DIABETES, AND HIGH BLOOD PRESSURE   High blood pressure causes heart disease and increases the risk of stroke. High blood pressure is more likely to develop in:  People who have blood pressure in the high end of the normal range (130-139/85-89 mm Hg).  People who are overweight or obese.  People who are African American.  If you are 5-66 years of age, have your blood pressure checked every 3-5 years. If you are 10 years of age or older, have your blood pressure checked every year. You should have your blood pressure measured twice--once when you are at a hospital or clinic, and once when you are not at a hospital or clinic. Record the average of the two measurements. To check your blood pressure when you are not at a hospital or clinic, you can use:  An automated blood pressure machine at a pharmacy.  A home blood pressure monitor.  If you  are between 31 years and 19 years old, ask your health care provider if you should take aspirin to prevent strokes.  Have regular diabetes screenings. This involves taking a blood sample to check your fasting blood sugar level.  If you are at a normal weight and have a low risk for diabetes, have this test once every three years after 56 years of age.  If you are overweight and have a high risk for diabetes, consider being tested at a younger age or more often. PREVENTING INFECTION  Hepatitis B  If you have a higher risk for hepatitis B, you should be screened for this virus. You are considered at high risk for hepatitis B if:  You were born in a  country where hepatitis B is common. Ask your health care provider which countries are considered high risk.  Your parents were born in a high-risk country, and you have not been immunized against hepatitis B (hepatitis B vaccine).  You have HIV or AIDS.  You use needles to inject street drugs.  You live with someone who has hepatitis B.  You have had sex with someone who has hepatitis B.  You get hemodialysis treatment.  You take certain medicines for conditions, including cancer, organ transplantation, and autoimmune conditions. Hepatitis C  Blood testing is recommended for:  Everyone born from [birth year] through 1966.  Anyone with known risk factors for hepatitis C. Sexually transmitted infections (STIs)  You should be screened for sexually transmitted infections (STIs) including gonorrhea and chlamydia if:  You are sexually active and are younger than 56 years of age.  You are older than 56 years of age and your health care provider tells you that you are at risk for this type of infection.  Your sexual activity has changed since you were last screened and you are at an increased risk for chlamydia or gonorrhea. Ask your health care provider if you are at risk.  If you do not have HIV, but are at risk, it may be recommended that you  take a prescription medicine daily to prevent HIV infection. This is called pre-exposure prophylaxis (PrEP). You are considered at risk if:  You are sexually active and do not regularly use condoms or know the HIV status of your partner(s).  You take drugs by injection.  You are sexually active with a partner who has HIV. Talk with your health care provider about whether you are at high risk of being infected with HIV. If you choose to begin PrEP, you should first be tested for HIV. You should then be tested every 3 months for as long as you are taking PrEP.  PREGNANCY   If you are premenopausal and you may become pregnant, ask your health care provider about preconception counseling.  If you may become pregnant, take 400 to 800 micrograms (mcg) of folic acid every day.  If you want to prevent pregnancy, talk to your health care provider about birth control (contraception). OSTEOPOROSIS AND MENOPAUSE   Osteoporosis is a disease in which the bones lose minerals and strength with aging. This can result in serious bone fractures. Your risk for osteoporosis can be identified using a bone density scan.  If you are 9 years of age or older, or if you are at risk for osteoporosis and fractures, ask your health care provider if you should be screened.  Ask your health care provider whether you should take a calcium or vitamin D supplement to lower your risk for osteoporosis.  Menopause may have certain physical symptoms and risks.  Hormone replacement therapy may reduce some of these symptoms and risks. Talk to your health care provider about whether hormone replacement therapy is right for you.  HOME CARE INSTRUCTIONS   Schedule regular health, dental, and eye exams.  Stay current with your immunizations.   Do not use any tobacco products including cigarettes, chewing tobacco, or electronic cigarettes.  If you are pregnant, do not drink alcohol.  If you are breastfeeding, limit how  much and how often you drink alcohol.  Limit alcohol intake to no more than 1 drink per day for nonpregnant women. One drink equals 12 ounces of beer, 5 ounces of wine, or 1 ounces of hard liquor.  Do  not use street drugs.  Do not share needles.  Ask your health care provider for help if you need support or information about quitting drugs.  Tell your health care provider if you often feel depressed.  Tell your health care provider if you have ever been abused or do not feel safe at home.   This information is not intended to replace advice given to you by your health care provider. Make sure you discuss any questions you have with your health care provider.   Document Released: 09/12/2010 Document Revised: 03/20/2014 Document Reviewed: 01/29/2013 Elsevier Interactive Patient Education [employer].

## 2016-01-12 ENCOUNTER — Encounter: Payer: Self-pay | Admitting: Pain Medicine

## 2016-01-12 ENCOUNTER — Ambulatory Visit: Payer: Medicare Other | Attending: Pain Medicine | Admitting: Pain Medicine

## 2016-01-12 VITALS — BP 156/90 | HR 48 | Temp 98.0°F | Ht 64.0 in | Wt 155.0 lb

## 2016-01-12 DIAGNOSIS — Z888 Allergy status to other drugs, medicaments and biological substances status: Secondary | ICD-10-CM | POA: Insufficient documentation

## 2016-01-12 DIAGNOSIS — Z880 Allergy status to penicillin: Secondary | ICD-10-CM | POA: Insufficient documentation

## 2016-01-12 DIAGNOSIS — M542 Cervicalgia: Secondary | ICD-10-CM | POA: Diagnosis not present

## 2016-01-12 DIAGNOSIS — Z882 Allergy status to sulfonamides status: Secondary | ICD-10-CM | POA: Insufficient documentation

## 2016-01-12 DIAGNOSIS — G894 Chronic pain syndrome: Secondary | ICD-10-CM | POA: Diagnosis not present

## 2016-01-12 DIAGNOSIS — F1190 Opioid use, unspecified, uncomplicated: Secondary | ICD-10-CM

## 2016-01-12 DIAGNOSIS — F329 Major depressive disorder, single episode, unspecified: Secondary | ICD-10-CM | POA: Diagnosis not present

## 2016-01-12 DIAGNOSIS — Z9889 Other specified postprocedural states: Secondary | ICD-10-CM | POA: Diagnosis not present

## 2016-01-12 DIAGNOSIS — Z885 Allergy status to narcotic agent status: Secondary | ICD-10-CM | POA: Diagnosis not present

## 2016-01-12 DIAGNOSIS — M25511 Pain in right shoulder: Secondary | ICD-10-CM

## 2016-01-12 DIAGNOSIS — J45909 Unspecified asthma, uncomplicated: Secondary | ICD-10-CM | POA: Insufficient documentation

## 2016-01-12 DIAGNOSIS — M545 Low back pain: Secondary | ICD-10-CM | POA: Diagnosis not present

## 2016-01-12 DIAGNOSIS — Z87442 Personal history of urinary calculi: Secondary | ICD-10-CM | POA: Insufficient documentation

## 2016-01-12 DIAGNOSIS — Z8249 Family history of ischemic heart disease and other diseases of the circulatory system: Secondary | ICD-10-CM | POA: Insufficient documentation

## 2016-01-12 DIAGNOSIS — M25512 Pain in left shoulder: Secondary | ICD-10-CM

## 2016-01-12 DIAGNOSIS — Z90710 Acquired absence of both cervix and uterus: Secondary | ICD-10-CM | POA: Insufficient documentation

## 2016-01-12 DIAGNOSIS — K219 Gastro-esophageal reflux disease without esophagitis: Secondary | ICD-10-CM | POA: Insufficient documentation

## 2016-01-12 DIAGNOSIS — Z803 Family history of malignant neoplasm of breast: Secondary | ICD-10-CM | POA: Insufficient documentation

## 2016-01-12 DIAGNOSIS — Z91013 Allergy to seafood: Secondary | ICD-10-CM | POA: Diagnosis not present

## 2016-01-12 DIAGNOSIS — G8929 Other chronic pain: Secondary | ICD-10-CM

## 2016-01-12 DIAGNOSIS — M25559 Pain in unspecified hip: Secondary | ICD-10-CM | POA: Diagnosis not present

## 2016-01-12 DIAGNOSIS — Z881 Allergy status to other antibiotic agents status: Secondary | ICD-10-CM | POA: Insufficient documentation

## 2016-01-12 DIAGNOSIS — M1610 Unilateral primary osteoarthritis, unspecified hip: Secondary | ICD-10-CM | POA: Diagnosis not present

## 2016-01-12 DIAGNOSIS — Z96641 Presence of right artificial hip joint: Secondary | ICD-10-CM | POA: Insufficient documentation

## 2016-01-12 DIAGNOSIS — I10 Essential (primary) hypertension: Secondary | ICD-10-CM | POA: Diagnosis not present

## 2016-01-12 DIAGNOSIS — M21371 Foot drop, right foot: Secondary | ICD-10-CM | POA: Diagnosis not present

## 2016-01-12 DIAGNOSIS — Z79891 Long term (current) use of opiate analgesic: Secondary | ICD-10-CM

## 2016-01-12 DIAGNOSIS — R51 Headache: Secondary | ICD-10-CM | POA: Insufficient documentation

## 2016-01-12 DIAGNOSIS — K589 Irritable bowel syndrome without diarrhea: Secondary | ICD-10-CM | POA: Diagnosis not present

## 2016-01-12 DIAGNOSIS — Z981 Arthrodesis status: Secondary | ICD-10-CM | POA: Insufficient documentation

## 2016-01-12 DIAGNOSIS — E871 Hypo-osmolality and hyponatremia: Secondary | ICD-10-CM | POA: Insufficient documentation

## 2016-01-12 DIAGNOSIS — I951 Orthostatic hypotension: Secondary | ICD-10-CM | POA: Diagnosis not present

## 2016-01-12 DIAGNOSIS — Z91018 Allergy to other foods: Secondary | ICD-10-CM | POA: Insufficient documentation

## 2016-01-12 DIAGNOSIS — Z9049 Acquired absence of other specified parts of digestive tract: Secondary | ICD-10-CM | POA: Insufficient documentation

## 2016-01-12 MED ORDER — OXYCODONE HCL 10 MG PO TABS: 10.0000 mg | ORAL_TABLET | Freq: Four times a day (QID) | ORAL | 0 refills | Status: DC | PRN

## 2016-01-12 NOTE — Progress Notes (Signed)
Nursing Pain Medication Assessment:  Safety precautions to be maintained throughout the outpatient stay will include: orient to surroundings, keep bed in low position, maintain call bell within reach at all times, provide assistance with transfer out of bed and ambulation.  Medication Inspection Compliance: [name] did not comply with our request to bring her pills to be counted. She was reminded that bringing the medication bottles, even when empty, is a requirement. Pill Count: No pills available to be counted today. Bottle Appearance: No container available. Did not bring bottle(s) to appointment. Medication: See above Filled Date: N/A Medication last intake: 01/05/16- Ran out because pt office resch appts

## 2016-01-12 NOTE — Patient Instructions (Addendum)
GENERAL RISKS AND COMPLICATIONS  What are the risk, side effects and possible complications? Generally speaking, most procedures are safe.  However, with any procedure there are risks, side effects, and the possibility of complications.  The risks and complications are dependent upon the sites that are lesioned, or the type of nerve block to be performed.  The closer the procedure is to the spine, the more serious the risks are.  Great care is taken when placing the radio frequency needles, block needles or lesioning probes, but sometimes complications can occur. 1. Infection: Any time there is an injection through the skin, there is a risk of infection.  This is why sterile conditions are used for these blocks.  There are four possible types of infection. 1. Localized skin infection. 2. Central Nervous System Infection-This can be in the form of Meningitis, which can be deadly. 3. Epidural Infections-This can be in the form of an epidural abscess, which can cause pressure inside of the spine, causing compression of the spinal cord with subsequent paralysis. This would require an emergency surgery to decompress, and there are no guarantees that the patient would recover from the paralysis. 4. Discitis-This is an infection of the intervertebral discs.  It occurs in about 1% of discography procedures.  It is difficult to treat and it may lead to surgery.        2. Pain: the needles have to go through skin and soft tissues, will cause soreness.       3. Damage to internal structures:  The nerves to be lesioned may be near blood vessels or    other nerves which can be potentially damaged.       4. Bleeding: Bleeding is more common if the patient is taking blood thinners such as  aspirin, Coumadin, Ticiid, Plavix, etc., or if he/she have some genetic predisposition  such as hemophilia. Bleeding into the spinal canal can cause compression of the spinal  cord with subsequent paralysis.  This would require an  emergency surgery to  decompress and there are no guarantees that the patient would recover from the  paralysis.       5. Pneumothorax:  Puncturing of a lung is a possibility, every time a needle is introduced in  the area of the chest or upper back.  Pneumothorax refers to free air around the  collapsed lung(s), inside of the thoracic cavity (chest cavity).  Another two possible  complications related to a similar event would include: Hemothorax and Chylothorax.   These are variations of the Pneumothorax, where instead of air around the collapsed  lung(s), you may have blood or chyle, respectively.       6. Spinal headaches: They may occur with any procedures in the area of the spine.       7. Persistent CSF (Cerebro-Spinal Fluid) leakage: This is a rare problem, but may occur  with prolonged intrathecal or epidural catheters either due to the formation of a fistulous  track or a dural tear.       8. Nerve damage: By working so close to the spinal cord, there is always a possibility of  nerve damage, which could be as serious as a permanent spinal cord injury with  paralysis.       9. Death:  Although rare, severe deadly allergic reactions known as "Anaphylactic  reaction" can occur to any of the medications used.      10. Worsening of the symptoms:  We can always make thing worse.    What are the chances of something like this happening? Chances of any of this occuring are extremely low.  By statistics, you have more of a chance of getting killed in a motor vehicle accident: while driving to the hospital than any of the above occurring .  Nevertheless, you should be aware that they are possibilities.  In general, it is similar to taking a shower.  Everybody knows that you can slip, hit your head and get killed.  Does that mean that you should not shower again?  Nevertheless always keep in mind that statistics do not mean anything if you happen to be on the wrong side of them.  Even if a procedure has a 1  (one) in a 1,000,000 (million) chance of going wrong, it you happen to be that one..Also, keep in mind that by statistics, you have more of a chance of having something go wrong when taking medications.  Who should not have this procedure? If you are on a blood thinning medication (e.g. Coumadin, Plavix, see list of "Blood Thinners"), or if you have an active infection going on, you should not have the procedure.  If you are taking any blood thinners, please inform your physician.  How should I prepare for this procedure?  Do not eat or drink anything at least six hours prior to the procedure.  Bring a driver with you .  It cannot be a taxi.  Come accompanied by an adult that can drive you back, and that is strong enough to help you if your legs get weak or numb from the local anesthetic.  Take all of your medicines the morning of the procedure with just enough water to swallow them.  If you have diabetes, make sure that you are scheduled to have your procedure done first thing in the morning, whenever possible.  If you have diabetes, take only half of your insulin dose and notify our nurse that you have done so as soon as you arrive at the clinic.  If you are diabetic, but only take blood sugar pills (oral hypoglycemic), then do not take them on the morning of your procedure.  You may take them after you have had the procedure.  Do not take aspirin or any aspirin-containing medications, at least eleven (11) days prior to the procedure.  They may prolong bleeding.  Wear loose fitting clothing that may be easy to take off and that you would not mind if it got stained with Betadine or blood.  Do not wear any jewelry or perfume  Remove any nail coloring.  It will interfere with some of our monitoring equipment.  NOTE: Remember that this is not meant to be interpreted as a complete list of all possible complications.  Unforeseen problems may occur.  BLOOD THINNERS The following drugs  contain aspirin or other products, which can cause increased bleeding during surgery and should not be taken for 2 weeks prior to and 1 week after surgery.  If you should need take something for relief of minor pain, you may take acetaminophen which is found in Tylenol,m Datril, Anacin-3 and Panadol. It is not blood thinner. The products listed below are.  Do not take any of the products listed below in addition to any listed on your instruction sheet.  A.P.C or A.P.C with Codeine Codeine Phosphate Capsules #3 Ibuprofen Ridaura  ABC compound Congesprin Imuran rimadil  Advil Cope Indocin Robaxisal  Alka-Seltzer Effervescent Pain Reliever and Antacid Coricidin or Coricidin-D  Indomethacin Rufen    Alka-Seltzer plus Cold Medicine Cosprin Ketoprofen S-A-C Tablets  Anacin Analgesic Tablets or Capsules Coumadin Korlgesic Salflex  Anacin Extra Strength Analgesic tablets or capsules CP-2 Tablets Lanoril Salicylate  Anaprox Cuprimine Capsules Levenox Salocol  Anexsia-D Dalteparin Magan Salsalate  Anodynos Darvon compound Magnesium Salicylate Sine-off  Ansaid Dasin Capsules Magsal Sodium Salicylate  Anturane Depen Capsules Marnal Soma  APF Arthritis pain formula Dewitt's Pills Measurin Stanback  Argesic Dia-Gesic Meclofenamic Sulfinpyrazone  Arthritis Bayer Timed Release Aspirin Diclofenac Meclomen Sulindac  Arthritis pain formula Anacin Dicumarol Medipren Supac  Analgesic (Safety coated) Arthralgen Diffunasal Mefanamic Suprofen  Arthritis Strength Bufferin Dihydrocodeine Mepro Compound Suprol  Arthropan liquid Dopirydamole Methcarbomol with Aspirin Synalgos  ASA tablets/Enseals Disalcid Micrainin Tagament  Ascriptin Doan's Midol Talwin  Ascriptin A/D Dolene Mobidin Tanderil  Ascriptin Extra Strength Dolobid Moblgesic Ticlid  Ascriptin with Codeine Doloprin or Doloprin with Codeine Momentum Tolectin  Asperbuf Duoprin Mono-gesic Trendar  Aspergum Duradyne Motrin or Motrin IB Triminicin  Aspirin  plain, buffered or enteric coated Durasal Myochrisine Trigesic  Aspirin Suppositories Easprin Nalfon Trillsate  Aspirin with Codeine Ecotrin Regular or Extra Strength Naprosyn Uracel  Atromid-S Efficin Naproxen Ursinus  Auranofin Capsules Elmiron Neocylate Vanquish  Axotal Emagrin Norgesic Verin  Azathioprine Empirin or Empirin with Codeine Normiflo Vitamin E  Azolid Emprazil Nuprin Voltaren  Bayer Aspirin plain, buffered or children's or timed BC Tablets or powders Encaprin Orgaran Warfarin Sodium  Buff-a-Comp Enoxaparin Orudis Zorpin  Buff-a-Comp with Codeine Equegesic Os-Cal-Gesic   Buffaprin Excedrin plain, buffered or Extra Strength Oxalid   Bufferin Arthritis Strength Feldene Oxphenbutazone   Bufferin plain or Extra Strength Feldene Capsules Oxycodone with Aspirin   Bufferin with Codeine Fenoprofen Fenoprofen Pabalate or Pabalate-SF   Buffets II Flogesic Panagesic   Buffinol plain or Extra Strength Florinal or Florinal with Codeine Panwarfarin   Buf-Tabs Flurbiprofen Penicillamine   Butalbital Compound Four-way cold tablets Penicillin   Butazolidin Fragmin Pepto-Bismol   Carbenicillin Geminisyn Percodan   Carna Arthritis Reliever Geopen Persantine   Carprofen Gold's salt Persistin   Chloramphenicol Goody's Phenylbutazone   Chloromycetin Haltrain Piroxlcam   Clmetidine heparin Plaquenil   Cllnoril Hyco-pap Ponstel   Clofibrate Hydroxy chloroquine Propoxyphen         Before stopping any of these medications, be sure to consult the physician who ordered them.  Some, such as Coumadin (Warfarin) are ordered to prevent or treat serious conditions such as "deep thrombosis", "pumonary embolisms", and other heart problems.  The amount of time that you may need off of the medication may also vary with the medication and the reason for which you were taking it.  If you are taking any of these medications, please make sure you notify your pain physician before you undergo any  procedures.          Trigger Point Injection Trigger points are areas where you have muscle pain. A trigger point injection is a shot given in the trigger point to relieve that pain. A trigger point might feel like a knot in your muscle. It hurts to press on a trigger point. Sometimes the pain spreads out (radiates) to other parts of the body. For example, pressing on a trigger point in your shoulder might cause pain in your arm or neck. You might have one trigger point. Or, you might have more than one. People often have trigger points in their upper back and lower back. They also occur often in the neck and shoulders. Pain from a trigger point lasts for a long time. It can  make it hard to keep moving. You might not be able to do the exercise or physical therapy that could help you deal with the pain. A trigger point injection may help. It does not work for everyone. But, it may relieve your pain for a few days or a few months. A trigger point injection does not cure long-lasting (chronic) pain. LET YOUR CAREGIVER KNOW ABOUT:  Any allergies (especially to latex, lidocaine, or steroids).  Blood-thinning medicines that you take. These drugs can lead to bleeding or bruising after an injection. They include:  Aspirin.  Ibuprofen.  Clopidogrel.  Warfarin.  Other medicines you take. This includes all vitamins, herbs, eyedrops, over-the-counter medicines, and creams.  Use of steroids.  Recent infections.  Past problems with numbing medicines.  Bleeding problems.  Surgeries you have had.  Other health problems. RISKS AND COMPLICATIONS A trigger point injection is a safe treatment. However, problems may develop, such as:  Minor side effects usually go away in 1 to 2 days. These may include:  Soreness.  Bruising.  Stiffness.  More serious problems are rare. But, they may include:  Bleeding under the skin (hematoma).  Skin infection.  Breaking off of the needle under  your skin.  Lung puncture.  The trigger point injection may not work for you. BEFORE THE PROCEDURE You may need to stop taking any medicine that thins your blood. This is to prevent bleeding and bruising. Usually these medicines are stopped several days before the injection. No other preparation is needed. PROCEDURE  A trigger point injection can be given in your caregiver's office or in a clinic. Each injection takes 2 minutes or less.  Your caregiver will feel for trigger points. The caregiver may use a marker to circle the area for the injection.  The skin over the trigger point will be washed with a germ-killing (antiseptic) solution.  The caregiver pinches the spot for the injection.  Then, a very thin needle is used for the shot. You may feel pain or a twitching feeling when the needle enters the trigger point.  A numbing solution may be injected into the trigger point. Sometimes a drug to keep down swelling, redness, and warmth (inflammation) is also injected.  Your caregiver moves the needle around the trigger zone until the tightness and twitching goes away.  After the injection, your caregiver may put gentle pressure over the injection site.  Then it is covered with a bandage. AFTER THE PROCEDURE  You can go right home after the injection.  The bandage can be taken off after a few hours.  You may feel sore and stiff for 1 to 2 days.  Go back to your regular activities slowly. Your caregiver may ask you to stretch your muscles. Do not do anything that takes extra energy for a few days.  Follow your caregiver's instructions to manage and treat other pain.   This information is not intended to replace advice given to you by your health care provider. Make sure you discuss any questions you have with your health care provider.   Document Released: 02/16/2011 Document Revised: 06/24/2012 Document Reviewed: 02/16/2011 [employer] Interactive Patient Education [employer].

## 2016-01-12 NOTE — Progress Notes (Signed)
Patient's Name: [name]  MRN: [id number]  Referring Provider: Mar Daring, [place]*  DOB: Oct 14, 1960  PCP: Mar Daring, PA-C  DOS: 01/12/2016  Note by: Kathlen Brunswick. Dossie Arbour, MD  Service setting: Ambulatory outpatient  Specialty: Interventional Pain Management  Location: [facility] (AMB) Pain Management Facility    Patient type: Established   Primary Reason(s) for Visit: Encounter for prescription drug management (Level of risk: moderate) CC: Neck Pain (shoulder left) and Back Pain (lower)  HPI  [name] is a 55 y.o. year old, female patient, who comes today for an initial evaluation. She has Chronic neck pain; Anxiety state; Asthma, mild; Blurred vision; Benign paroxysmal positional nystagmus; Congenital renal agenesis and dysgenesis; Clinical depression; Colon, diverticulosis; Fatigue; Cannot sleep; IBS (irritable bowel syndrome); Awareness of heartbeats; RAD (reactive airway disease); Apnea, sleep; Esophagitis, reflux; DOE (dyspnea on exertion); Chronic left shoulder pain; Chronic right hip pain; Encounter for therapeutic drug level monitoring; Long term current use of opiate analgesic; Long term prescription opiate use; Opiate use (60 MME/Day); Chronic pain syndrome; Chronic pain; Steroid intolerance; Cervical spine pain; Cervical spondylosis; Cervical facet arthropathy (bilateral); History of cervical spinal surgery; Chronic shoulder pain (Location of Secondary source of pain) (Bilateral) (L>R); 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 Her primarily concern today is the Neck Pain (shoulder left) and Back Pain (lower)  Pain Assessment: Self-Reported Pain Score: 4 /10             Reported level is compatible with observation.       Pain Location: Back (hip) Pain Orientation: Left Pain Descriptors / Indicators: Aching (hard to lift up -tearing , ) Pain Frequency: Constant  [name] was last seen on 10/07/2015 for medication management. During today's appointment we reviewed Ms. Raeford Razor chronic pain status, as well as her outpatient medication regimen.  The patient  reports that she does not use drugs. Her body mass index is 26.61 kg/m.  Further details on both, my assessment(s), as well as the proposed treatment plan, please see below.  Controlled Substance Pharmacotherapy Assessment REMS (Risk Evaluation and Mitigation Strategy)  Analgesic:Oxycodone IR 10 mg every 6 hours (40 mg/day) MME/day:60 mg/day Ignatius Specking, RN  01/12/2016  2:25 PM  Sign at close encounter Nursing Pain Medication Assessment:  Safety precautions to be maintained throughout the outpatient stay will include: orient to surroundings, keep bed in low position, maintain call bell within reach at all times, provide assistance with transfer out of bed and ambulation.  Medication Inspection Compliance: [name] did not comply with our request to bring her pills to be counted. She was reminded that bringing the medication bottles, even when empty, is a requirement. Pill Count: No pills available to be counted today. Bottle Appearance: No container available. Did not bring bottle(s) to appointment. Medication: See above Filled Date: N/A Medication last intake: 01/05/16- Ran out because pt office resch appts   Pharmacokinetics: Liberation and absorption (onset of action): WNL Distribution (time to  peak effect): WNL Metabolism and excretion (duration of action):  WNL         Pharmacodynamics: Desired effects: Analgesia: The patient reports >50% benefit. Reported improvement in function: The patient reports medication allows her to accomplish basic ADLs. Clinically meaningful improvement in function (CMIF): Sustained CMIF goals met Perceived effectiveness: Described as relatively effective, allowing for increase in activities of daily living (ADL) Undesirable effects: Side-effects or Adverse reactions: None reported Monitoring: [place] PMP: Online review of the past 73-monthperiod conducted. Compliant with practice rules and regulations List of all UDS test(s) done:  Lab Results  Component Value Date   TOXASSSELUR FINAL 10/07/2015   TOXASSSELUR FINAL 07/02/2015   T[place]FINAL 04/05/2015   Last UDS on record: ToxAssure Select 13  Date Value Ref Range Status  10/07/2015 FINAL  Final    Comment:    ==================================================================== TOXASSURE SELECT 13 (MW) ==================================================================== Test                             Result       Flag       Units Drug Present and Declared for Prescription Verification   Alprazolam                     340          EXPECTED   ng/mg creat   Alpha-hydroxyalprazolam        388          EXPECTED   ng/mg creat    Source of alprazolam is a scheduled prescription medication.    Alpha-hydroxyalprazolam is an expected metabolite of alprazolam.   Oxycodone                      5844         EXPECTED   ng/mg creat   Oxymorphone                    232          EXPECTED   ng/mg creat   Noroxycodone                   14540        EXPECTED   ng/mg creat    Sources of oxycodone include scheduled prescription medications.    Oxymorphone and noroxycodone are expected metabolites of    oxycodone. Oxymorphone is also available as a scheduled    prescription  medication. ==================================================================== Test                      Result    Flag   Units      Ref Range   Creatinine              25               mg/dL      >=20 ==================================================================== Declared Medications:  The flagging and interpretation on this report are based on the  following declared medications.  Unexpected results may arise from  inaccuracies in the declared medications.  **Note: The testing scope of this panel includes these medications:  Alprazolam (Xanax)  Oxycodone  **Note: The testing scope of this panel does not include following  reported medications:  Albuterol (Proventil)  Bupropion (Wellbutrin)  Fluticasone (Advair)  Fluticasone (Flonase)  Linaclotide (Linzess)  Loratadine (Claritin)  Metoprolol  Montelukast (Singulair)  Omeprazole  Salmeterol (Advair)  Valacyclovir (Valtrex) ==================================================================== For clinical consultation, please call [phone]. ====================================================================    UDS interpretation: Compliant          Medication Assessment Form: Reviewed. Patient indicates being compliant with therapy Treatment compliance: Compliant Risk Assessment Profile: Aberrant behavior: See prior evaluations. None observed or detected today Comorbid factors increasing risk of overdose: See prior notes. No additional risks detected today Risk of substance use disorder (SUD): Low Opioid Risk Tool (ORT) Total Score:    Interpretation Table:  Score <3 = Low Risk for SUD  Score between 4-7 = Moderate Risk for SUD  Score >8 = High Risk for Opioid Abuse   Risk Mitigation Strategies:  Patient Counseling: Covered Patient-Prescriber Agreement (PPA): Present and active  Notification to other healthcare providers: Done  Pharmacologic Plan: No change in therapy, at this time  Laboratory Chemistry   Inflammation Markers Lab Results  Component Value Date   ESRSEDRATE 37 (H) 04/05/2015   CRP 0.5 04/05/2015   Renal Function Lab Results  Component Value Date   BUN 11 04/06/2015   CREATININE 0.78 04/06/2015   GFRAA >60 04/06/2015   GFRNONAA >60 04/06/2015   Hepatic Function Lab Results  Component Value Date   AST 19 04/06/2015   ALT 13 (L) 04/06/2015   ALBUMIN 4.7 04/06/2015   Electrolytes Lab Results  Component Value Date   NA 137 04/06/2015   K 3.7 04/06/2015   CL 103 04/06/2015   CALCIUM 10.0 04/06/2015   MG 2.1 04/05/2015   Pain Modulating Vitamins No results found for: Maralyn Sago [id number], [id number], [id number], 25OHVITD1, 25OHVITD2, 25OHVITD3, VITAMINB12 Coagulation Parameters Lab Results  Component Value Date   INR 1.05 03/06/2015   LABPROT 13.9 03/06/2015   APTT 33 03/06/2015   PLT 409 04/06/2015   Cardiovascular Lab Results  Component Value Date   HGB 14.2 04/06/2015   HCT 43.0 04/06/2015   Note: Lab results reviewed.  Recent Diagnostic Imaging Review  No results found. Note: Imaging results reviewed.  Meds  The patient has a current medication list which includes the following prescription(s): albuterol, alprazolam, bupropion, escitalopram, fluticasone, fluticasone-salmeterol, linzess, loratadine, metoprolol succinate, montelukast, omeprazole, ondansetron, oxycodone hcl, oxycodone hcl, oxycodone hcl, oxycodone hcl, aerochamber mv, and valacyclovir.  Current Outpatient Prescriptions on File Prior to Visit  Medication Sig  . albuterol (PROVENTIL HFA;VENTOLIN HFA) 108 (90 BASE) MCG/ACT inhaler Inhale 2 puffs into the lungs every 6 (six) hours as needed for wheezing.  Marland Kitchen ALPRAZolam (XANAX) 1 MG tablet TAKE 1/2 A TABLET BY MOUTH EVERY MORNING, TAKE 1/2 A TABLET AT LUNCH AND 2 TABLETS AT BEDTIME  . buPROPion (WELLBUTRIN SR) 150 MG 12 hr tablet TAKE 1 TABLET BY MOUTH TWICE A DAY  . escitalopram (LEXAPRO) 10 MG tablet Take 1/2 tab PO q h.s. X 1 week then  increase to 1 tab PO q h.s.  . fluticasone (FLONASE) 50 MCG/ACT nasal spray Place 2 sprays into the nose daily.  . Fluticasone-Salmeterol (ADVAIR DISKUS) 500-50 MCG/DOSE AEPB Inhale 1 puff into the lungs 2 (two) times daily.  Marland Kitchen LINZESS 290 MCG CAPS capsule TAKE ONE CAPSULE BY MOUTH EVERY DAY  . loratadine (CLARITIN REDITABS) 10 MG dissolvable tablet Take 1 tablet (10 mg total) by mouth daily.  . metoprolol succinate (TOPROL-XL) 50 MG 24 hr tablet TAKE 1 TABLET BY MOUTH EVERY DAY  . montelukast (SINGULAIR) 10 MG tablet TAKE 1 TABLET BY MOUTH EVERY DAY  . omeprazole (PRILOSEC) 20 MG capsule TAKE ONE CAPSULE BY MOUTH TWICE A DAY  . ondansetron (ZOFRAN) 4 MG tablet Take 1 tablet (4 mg total)  by mouth every 8 (eight) hours as needed for nausea or vomiting.  . Oxycodone HCl 10 MG TABS Take 1 tablet (10 mg total) by mouth every 6 (six) hours as needed.  Marland Kitchen Spacer/Aero-Holding Chambers (AEROCHAMBER MV) inhaler by Does not apply route.  . valACYclovir (VALTREX) 1000 MG tablet TAKE TWO TABLETS BY MOUTH TWICE DAILY FOR ONE DAY FEVER BLISTER (Patient taking differently: as needed. TAKE TWO TABLETS BY MOUTH TWICE DAILY FOR ONE DAY FEVER BLISTER)   No current facility-administered medications on file prior to visit.    ROS  Constitutional: Denies any fever or chills Gastrointestinal: No reported hemesis, hematochezia, vomiting, or acute GI distress Musculoskeletal: Denies any acute onset joint swelling, redness, loss of ROM, or weakness Neurological: No reported episodes of acute onset apraxia, aphasia, dysarthria, agnosia, amnesia, paralysis, loss of coordination, or loss of consciousness  Allergies  [name] is allergic to amoxicillin; clindamycin; codeine; sulfa antibiotics; shellfish allergy; decadron [dexamethasone]; papaya derivatives; prednisone; betadine [povidone iodine]; chlorhexidine; clarithromycin; clindamycin/lincomycin; and other.  PFSH  Drug: [name]  reports that she does not use  drugs. Alcohol:  reports that she does not drink alcohol. Tobacco:  reports that she has never smoked. She has never used smokeless tobacco. 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); Anemia; Anxiety; Asthma; Cephalalgia (08/18/2014); Cervical disc disease (08/18/2014); Chronic headaches; DDD (degenerative disc disease); Depression; Dizziness (04/22/2013); Duodenal ulcer with hemorrhage and perforation ([place]) (04/27/2003); GERD (gastroesophageal reflux disease); H/O arthrodesis (08/18/2014); Headache(784.0); History of blood transfusion; History of cervical spinal surgery (01/04/2015); History of kidney stones; Hypertension; Inverted T wave; Narrowing of intervertebral disc space (08/18/2014); Orthostatic hypotension (04/22/2013); Pain; Pneumonia; PONV (postoperative nausea and vomiting); Postop Hyponatremia (05/14/2012); Postoperative anemia due to acute blood loss (05/14/2012); Right foot drop; and Right hip arthralgia (08/18/2014). Family: family history includes Aneurysm in her maternal grandmother and mother; Breast cancer (age of onset: 8) in her paternal grandmother.  Past Surgical History:  Procedure Laterality Date  . ABDOMINAL HYSTERECTOMY    . ANTERIOR CERVICAL DECOMP/DISCECTOMY FUSION N/A 10/30/2012   Procedure: ACDF C5-6, EXPLORATION AND HARDWARE REMOVAL C6-7;  Surgeon: Melina Schools, MD;  Location: [place];  Service: Orthopedics;  Laterality: N/A;  . ANTERIOR FUSION CERVICAL SPINE  MAY 2012   AT [facility]  . BACK SURGERY  2009   LUMBAR FUSION WITH RODS   . BREAST BIOPSY Right 2008   benign  . BREAST EXCISIONAL BIOPSY Left 1998   benign  . CARPAL TUNNEL RELEASE  05-06-12   Right  . CHOLECYSTECTOMY    . DIAGNOSTIC LAPAROSCOPIES - MULTIPLE FOR ENDOMETRIOSIS    . HIP ARTHROSCOPY  09/20/2011   Procedure: ARTHROSCOPY HIP;  Surgeon: Gearlean Alf, MD;  Location: [facility];  Service: Orthopedics;  Laterality: Right;  Right Hip Scope with Labral Debridement  . NASAL SEPTUM  SURGERY  MARCH 2013   IN [place]  . POSTERIOR CERVICAL FUSION/FORAMINOTOMY N/A 04/16/2013   Procedure: REMOVAL CERVICAL PLATES AND INTERBODY CAGE/POSTERIOR CERVICAL SPINAL FUSION C4 - C6/C5 CORPECTOMY/C4 - C6 FUSION WITH ILIAC CREST BONE GRAFT;  Surgeon: Melina Schools, MD;  Location: [place];  Service: Orthopedics;  Laterality: N/A;  . RIGHT HIP ARTHROSCOPY FOR LABRAL TEAR  ABOUT 2010   2012 also  . SHOULDER ARTHROSCOPY  05-06-12   bone spur  . TONSILLECTOMY     AS A CHILD  . TOTAL HIP ARTHROPLASTY Right 05/13/2012   Procedure: TOTAL HIP ARTHROPLASTY ANTERIOR APPROACH;  Surgeon: Gearlean Alf, MD;  Location: [facility];  Service: Orthopedics;  Laterality: Right;   Constitutional Exam  General appearance: Well nourished, well developed, and well hydrated. In no apparent acute distress Vitals:   01/12/16 1413  BP: (!) 156/90  Pulse: (!) 48  Temp: 98 F (36.7 C)  SpO2: 100%  Weight: 155 lb (70.3 kg)  Height: _0  (1.626 m)   BMI Assessment: Estimated body mass index is 26.61 kg/m as calculated from the following:   Height as of this encounter: _1  (1.626 m).   Weight as of this encounter: 155 lb (70.3 kg).  BMI interpretation table: BMI level Category Range association with higher incidence of chronic pain  <18 kg/m2 Underweight   18.5-24.9 kg/m2 Ideal body weight   25-29.9 kg/m2 Overweight Increased incidence by 20%  30-34.9 kg/m2 Obese (Class I) Increased incidence by 68%  35-39.9 kg/m2 Severe obesity (Class II) Increased incidence by 136%  >40 kg/m2 Extreme obesity (Class III) Increased incidence by 254%   BMI Readings from Last 4 Encounters:  01/12/16 26.61 kg/m  12/30/15 26.61 kg/m  10/07/15 26.61 kg/m  09/16/15 26.54 kg/m   Wt Readings from Last 4 Encounters:  01/12/16 155 lb (70.3 kg)  12/30/15 155 lb (70.3 kg)  10/07/15 155 lb (70.3 kg)  09/16/15 154 lb 9.6 oz (70.1 kg)  Psych/Mental status: Alert, oriented x 3 (person, place, & time) Eyes: PERLA Respiratory:  No evidence of acute respiratory distress  Cervical Spine Exam  Inspection: No masses, redness, or swelling Alignment: Symmetrical Functional ROM: Unrestricted ROM Stability: No instability detected Muscle strength & Tone: Functionally intact Sensory: Unimpaired Palpation: Non-contributory  Upper Extremity (UE) Exam    Side: Right upper extremity  Side: Left upper extremity  Inspection: No masses, redness, swelling, or asymmetry  Inspection: No masses, redness, swelling, or asymmetry  Functional ROM: Unrestricted ROM         Functional ROM: Unrestricted ROM          Muscle strength & Tone: Functionally intact  Muscle strength & Tone: Functionally intact  Sensory: Unimpaired  Sensory: Unimpaired  Palpation: Non-contributory  Palpation: Non-contributory   Thoracic Spine Exam  Inspection: No masses, redness, or swelling Alignment: Symmetrical Functional ROM: Unrestricted ROM Stability: No instability detected Sensory: Unimpaired Muscle strength & Tone: Functionally intact Palpation: Non-contributory  Lumbar Spine Exam  Inspection: No masses, redness, or swelling Alignment: Symmetrical Functional ROM: Unrestricted ROM Stability: No instability detected Muscle strength & Tone: Functionally intact Sensory: Unimpaired Palpation: Non-contributory Provocative Tests: Lumbar Hyperextension and rotation test: evaluation deferred today       Patrick's Maneuver: evaluation deferred today              Gait & Posture Assessment  Ambulation: Unassisted Gait: Relatively normal for age and body habitus Posture: WNL   Lower Extremity Exam    Side: Right lower extremity  Side: Left lower extremity  Inspection: No masses, redness, swelling, or asymmetry  Inspection: No masses, redness, swelling, or asymmetry  Functional ROM: Unrestricted ROM          Functional ROM: Unrestricted ROM          Muscle strength & Tone: Functionally intact  Muscle strength & Tone: Functionally intact  Sensory:  Unimpaired  Sensory: Unimpaired  Palpation: Non-contributory  Palpation: Non-contributory   Assessment  Primary Diagnosis & Pertinent Problem List: The primary encounter diagnosis was Chronic pain syndrome. Diagnoses of Chronic hip pain, unspecified laterality, Chronic shoulder pain (Location of Secondary source of pain) (Bilateral) (L>R), Long term current  use of opiate analgesic, and Opiate use (60 MME/Day) were also pertinent to this visit.  Visit Diagnosis: 1. Chronic pain syndrome   2. Chronic hip pain, unspecified laterality   3. Chronic shoulder pain (Location of Secondary source of pain) (Bilateral) (L>R)   4. Long term current use of opiate analgesic   5. Opiate use (60 MME/Day)    Plan of Care  Pharmacotherapy (Medications Ordered): Meds ordered this encounter  Medications  . Oxycodone HCl 10 MG TABS    Sig: Take 1 tablet (10 mg total) by mouth every 6 (six) hours as needed.    Dispense:  120 tablet    Refill:  0    Do not add this medication to the electronic "Automatic Refill" notification system. Patient may have prescription filled one day early if pharmacy is closed on scheduled refill date. Do not fill until: 01/12/16 To last until: 02/11/16  . Oxycodone HCl 10 MG TABS    Sig: Take 1 tablet (10 mg total) by mouth every 6 (six) hours as needed.    Dispense:  120 tablet    Refill:  0    Do not add this medication to the electronic "Automatic Refill" notification system. Patient may have prescription filled one day early if pharmacy is closed on scheduled refill date. Do not fill until: 02/11/16 To last until: 03/12/16  . Oxycodone HCl 10 MG TABS    Sig: Take 1 tablet (10 mg total) by mouth every 6 (six) hours as needed.    Dispense:  120 tablet    Refill:  0    Do not add this medication to the electronic "Automatic Refill" notification system. Patient may have prescription filled one day early if pharmacy is closed on scheduled refill date. Do not fill until:  03/12/16 To last until: 04/11/16   New Prescriptions   OXYCODONE HCL 10 MG TABS    Take 1 tablet (10 mg total) by mouth every 6 (six) hours as needed.   OXYCODONE HCL 10 MG TABS    Take 1 tablet (10 mg total) by mouth every 6 (six) hours as needed.   OXYCODONE HCL 10 MG TABS    Take 1 tablet (10 mg total) by mouth every 6 (six) hours as needed.   Medications administered during this visit: [name] had no medications administered during this visit. Lab-work, Procedure(s), & Referral(s) Ordered: Orders Placed This Encounter  Procedures  . SUPRASCAPULAR NERVE BLOCK   Imaging & Referral(s) Ordered: None  Interventional Therapies: Pending/Scheduled/Planned:   Diagnostic bilateral suprascapular nerve block under fluoroscopic guidance and IV sedation.    Considering:   Diagnostic bilateral lumbar facet block without steroids under fluoroscopic guidance and IV sedation.    PRN Procedures:   None at this time    Requested PM Follow-up: Return in about 3 months (around 04/13/2016) for Med-Mgmt, In addition, Schedule Procedure, (ASAP).  Future Appointments Date Time Provider [place]  01/27/2016 9:45 AM Mar Daring, PA-C [facility] None  02/17/2016 11:00 AM [facility] DEXA [facility]  04/13/2016 8:30 AM Milinda Pointer, MD [facility] [place] None   Primary Care Physician: Mar Daring, PA-C Location: [facility] Note by: Kathlen Brunswick. Dossie Arbour, M.D, DABA, DABAPM, DABPM, DABIPP, FIPP  Pain Score Disclaimer: We use the NRS-11 scale. This is a self-reported, subjective measurement of pain severity with only modest accuracy. It is used primarily to identify changes within a particular patient. It must be understood that outpatient pain scales are significantly less  accurate that those used for research, where they can be applied under ideal controlled circumstances with minimal exposure to variables. In reality, the score is likely to be a  combination of pain intensity and pain affect, where pain affect describes the degree of emotional arousal or changes in action readiness caused by the sensory experience of pain. Factors such as social and work situation, setting, emotional state, anxiety levels, expectation, and prior pain experience may influence pain perception and show large inter-individual differences that may also be affected by time variables.  Patient instructions provided during this appointment: Patient Instructions   GENERAL RISKS AND COMPLICATIONS  What are the risk, side effects and possible complications? Generally speaking, most procedures are safe.  However, with any procedure there are risks, side effects, and the possibility of complications.  The risks and complications are dependent upon the sites that are lesioned, or the type of nerve block to be performed.  The closer the procedure is to the spine, the more serious the risks are.  Great care is taken when placing the radio frequency needles, block needles or lesioning probes, but sometimes complications can occur. 1. Infection: Any time there is an injection through the skin, there is a risk of infection.  This is why sterile conditions are used for these blocks.  There are four possible types of infection. 1. Localized skin infection. 2. Central Nervous System Infection-This can be in the form of Meningitis, which can be deadly. 3. Epidural Infections-This can be in the form of an epidural abscess, which can cause pressure inside of the spine, causing compression of the spinal cord with subsequent paralysis. This would require an emergency surgery to decompress, and there are no guarantees that the patient would recover from the paralysis. 4. Discitis-This is an infection of the intervertebral discs.  It occurs in about 1% of discography procedures.  It is difficult to treat and it may lead to surgery.        2. Pain: the needles have to go through skin and  soft tissues, will cause soreness.       3. Damage to internal structures:  The nerves to be lesioned may be near blood vessels or    other nerves which can be potentially damaged.       4. Bleeding: Bleeding is more common if the patient is taking blood thinners such as  aspirin, Coumadin, Ticiid, Plavix, etc., or if he/she have some genetic predisposition  such as hemophilia. Bleeding into the spinal canal can cause compression of the spinal  cord with subsequent paralysis.  This would require an emergency surgery to  decompress and there are no guarantees that the patient would recover from the  paralysis.       5. Pneumothorax:  Puncturing of a lung is a possibility, every time a needle is introduced in  the area of the chest or upper back.  Pneumothorax refers to free air around the  collapsed lung(s), inside of the thoracic cavity (chest cavity).  Another two possible  complications related to a similar event would include: Hemothorax and Chylothorax.   These are variations of the Pneumothorax, where instead of air around the collapsed  lung(s), you may have blood or chyle, respectively.       6. Spinal headaches: They may occur with any procedures in the area of the spine.       7. Persistent CSF (Cerebro-Spinal Fluid) leakage: This is a rare problem, but may occur  with prolonged  intrathecal or epidural catheters either due to the formation of a fistulous  track or a dural tear.       8. Nerve damage: By working so close to the spinal cord, there is always a possibility of  nerve damage, which could be as serious as a permanent spinal cord injury with  paralysis.       9. Death:  Although rare, severe deadly allergic reactions known as "Anaphylactic  reaction" can occur to any of the medications used.      10. Worsening of the symptoms:  We can always make thing worse.  What are the chances of something like this happening? Chances of any of this occuring are extremely low.  By statistics, you  have more of a chance of getting killed in a motor vehicle accident: while driving to the hospital than any of the above occurring .  Nevertheless, you should be aware that they are possibilities.  In general, it is similar to taking a shower.  Everybody knows that you can slip, hit your head and get killed.  Does that mean that you should not shower again?  Nevertheless always keep in mind that statistics do not mean anything if you happen to be on the wrong side of them.  Even if a procedure has a 1 (one) in a 1,000,000 (million) chance of going wrong, it you happen to be that one..Also, keep in mind that by statistics, you have more of a chance of having something go wrong when taking medications.  Who should not have this procedure? If you are on a blood thinning medication (e.g. Coumadin, Plavix, see list of "Blood Thinners"), or if you have an active infection going on, you should not have the procedure.  If you are taking any blood thinners, please inform your physician.  How should I prepare for this procedure?  Do not eat or drink anything at least six hours prior to the procedure.  Bring a driver with you .  It cannot be a taxi.  Come accompanied by an adult that can drive you back, and that is strong enough to help you if your legs get weak or numb from the local anesthetic.  Take all of your medicines the morning of the procedure with just enough water to swallow them.  If you have diabetes, make sure that you are scheduled to have your procedure done first thing in the morning, whenever possible.  If you have diabetes, take only half of your insulin dose and notify our nurse that you have done so as soon as you arrive at the clinic.  If you are diabetic, but only take blood sugar pills (oral hypoglycemic), then do not take them on the morning of your procedure.  You may take them after you have had the procedure.  Do not take aspirin or any aspirin-containing medications, at least  eleven (11) days prior to the procedure.  They may prolong bleeding.  Wear loose fitting clothing that may be easy to take off and that you would not mind if it got stained with Betadine or blood.  Do not wear any jewelry or perfume  Remove any nail coloring.  It will interfere with some of our monitoring equipment.  NOTE: Remember that this is not meant to be interpreted as a complete list of all possible complications.  Unforeseen problems may occur.  BLOOD THINNERS The following drugs contain aspirin or other products, which can cause increased bleeding during surgery and should  not be taken for 2 weeks prior to and 1 week after surgery.  If you should need take something for relief of minor pain, you may take acetaminophen which is found in Tylenol,m Datril, Anacin-3 and Panadol. It is not blood thinner. The products listed below are.  Do not take any of the products listed below in addition to any listed on your instruction sheet.  A.P.C or A.P.C with Codeine Codeine Phosphate Capsules #3 Ibuprofen Ridaura  ABC compound Congesprin Imuran rimadil  Advil Cope Indocin Robaxisal  Alka-Seltzer Effervescent Pain Reliever and Antacid Coricidin or Coricidin-D  Indomethacin Rufen  Alka-Seltzer plus Cold Medicine Cosprin Ketoprofen S-A-C Tablets  Anacin Analgesic Tablets or Capsules Coumadin Korlgesic Salflex  Anacin Extra Strength Analgesic tablets or capsules CP-2 Tablets Lanoril Salicylate  Anaprox Cuprimine Capsules Levenox Salocol  Anexsia-D Dalteparin Magan Salsalate  Anodynos Darvon compound Magnesium Salicylate Sine-off  Ansaid Dasin Capsules Magsal Sodium Salicylate  Anturane Depen Capsules Marnal Soma  APF Arthritis pain formula Dewitt's Pills Measurin Stanback  Argesic Dia-Gesic Meclofenamic Sulfinpyrazone  Arthritis Bayer Timed Release Aspirin Diclofenac Meclomen Sulindac  Arthritis pain formula Anacin Dicumarol Medipren Supac  Analgesic (Safety coated) Arthralgen Diffunasal  Mefanamic Suprofen  Arthritis Strength Bufferin Dihydrocodeine Mepro Compound Suprol  Arthropan liquid Dopirydamole Methcarbomol with Aspirin Synalgos  ASA tablets/Enseals Disalcid Micrainin Tagament  Ascriptin Doan's Midol Talwin  Ascriptin A/D Dolene Mobidin Tanderil  Ascriptin Extra Strength Dolobid Moblgesic Ticlid  Ascriptin with Codeine Doloprin or Doloprin with Codeine Momentum Tolectin  Asperbuf Duoprin Mono-gesic Trendar  Aspergum Duradyne Motrin or Motrin IB Triminicin  Aspirin plain, buffered or enteric coated Durasal Myochrisine Trigesic  Aspirin Suppositories Easprin Nalfon Trillsate  Aspirin with Codeine Ecotrin Regular or Extra Strength Naprosyn Uracel  Atromid-S Efficin Naproxen Ursinus  Auranofin Capsules Elmiron Neocylate Vanquish  Axotal Emagrin Norgesic Verin  Azathioprine Empirin or Empirin with Codeine Normiflo Vitamin E  Azolid Emprazil Nuprin Voltaren  Bayer Aspirin plain, buffered or children's or timed BC Tablets or powders Encaprin Orgaran Warfarin Sodium  Buff-a-Comp Enoxaparin Orudis Zorpin  Buff-a-Comp with Codeine Equegesic Os-Cal-Gesic   Buffaprin Excedrin plain, buffered or Extra Strength Oxalid   Bufferin Arthritis Strength Feldene Oxphenbutazone   Bufferin plain or Extra Strength Feldene Capsules Oxycodone with Aspirin   Bufferin with Codeine Fenoprofen Fenoprofen Pabalate or Pabalate-SF   Buffets II Flogesic Panagesic   Buffinol plain or Extra Strength Florinal or Florinal with Codeine Panwarfarin   Buf-Tabs Flurbiprofen Penicillamine   Butalbital Compound Four-way cold tablets Penicillin   Butazolidin Fragmin Pepto-Bismol   Carbenicillin Geminisyn Percodan   Carna Arthritis Reliever Geopen Persantine   Carprofen Gold's salt Persistin   Chloramphenicol Goody's Phenylbutazone   Chloromycetin Haltrain Piroxlcam   Clmetidine heparin Plaquenil   Cllnoril Hyco-pap Ponstel   Clofibrate Hydroxy chloroquine Propoxyphen         Before stopping any of  these medications, be sure to consult the physician who ordered them.  Some, such as Coumadin (Warfarin) are ordered to prevent or treat serious conditions such as "deep thrombosis", "pumonary embolisms", and other heart problems.  The amount of time that you may need off of the medication may also vary with the medication and the reason for which you were taking it.  If you are taking any of these medications, please make sure you notify your pain physician before you undergo any procedures.          Trigger Point Injection Trigger points are areas where you have muscle pain. A trigger point injection is a shot  given in the trigger point to relieve that pain. A trigger point might feel like a knot in your muscle. It hurts to press on a trigger point. Sometimes the pain spreads out (radiates) to other parts of the body. For example, pressing on a trigger point in your shoulder might cause pain in your arm or neck. You might have one trigger point. Or, you might have more than one. People often have trigger points in their upper back and lower back. They also occur often in the neck and shoulders. Pain from a trigger point lasts for a long time. It can make it hard to keep moving. You might not be able to do the exercise or physical therapy that could help you deal with the pain. A trigger point injection may help. It does not work for everyone. But, it may relieve your pain for a few days or a few months. A trigger point injection does not cure long-lasting (chronic) pain. LET YOUR CAREGIVER KNOW ABOUT:  Any allergies (especially to latex, lidocaine, or steroids).  Blood-thinning medicines that you take. These drugs can lead to bleeding or bruising after an injection. They include:  Aspirin.  Ibuprofen.  Clopidogrel.  Warfarin.  Other medicines you take. This includes all vitamins, herbs, eyedrops, over-the-counter medicines, and creams.  Use of steroids.  Recent infections.  Past  problems with numbing medicines.  Bleeding problems.  Surgeries you have had.  Other health problems. RISKS AND COMPLICATIONS A trigger point injection is a safe treatment. However, problems may develop, such as:  Minor side effects usually go away in 1 to 2 days. These may include:  Soreness.  Bruising.  Stiffness.  More serious problems are rare. But, they may include:  Bleeding under the skin (hematoma).  Skin infection.  Breaking off of the needle under your skin.  Lung puncture.  The trigger point injection may not work for you. BEFORE THE PROCEDURE You may need to stop taking any medicine that thins your blood. This is to prevent bleeding and bruising. Usually these medicines are stopped several days before the injection. No other preparation is needed. PROCEDURE  A trigger point injection can be given in your caregiver's office or in a clinic. Each injection takes 2 minutes or less.  Your caregiver will feel for trigger points. The caregiver may use a marker to circle the area for the injection.  The skin over the trigger point will be washed with a germ-killing (antiseptic) solution.  The caregiver pinches the spot for the injection.  Then, a very thin needle is used for the shot. You may feel pain or a twitching feeling when the needle enters the trigger point.  A numbing solution may be injected into the trigger point. Sometimes a drug to keep down swelling, redness, and warmth (inflammation) is also injected.  Your caregiver moves the needle around the trigger zone until the tightness and twitching goes away.  After the injection, your caregiver may put gentle pressure over the injection site.  Then it is covered with a bandage. AFTER THE PROCEDURE  You can go right home after the injection.  The bandage can be taken off after a few hours.  You may feel sore and stiff for 1 to 2 days.  Go back to your regular activities slowly. Your caregiver may  ask you to stretch your muscles. Do not do anything that takes extra energy for a few days.  Follow your caregiver's instructions to manage and treat other pain.  This information is not intended to replace advice given to you by your health care provider. Make sure you discuss any questions you have with your health care provider.   Document Released: 02/16/2011 Document Revised: 06/24/2012 Document Reviewed: 02/16/2011 Elsevier Interactive Patient Education [employer].

## 2016-01-14 ENCOUNTER — Encounter: Payer: Self-pay | Admitting: Pain Medicine

## 2016-01-27 ENCOUNTER — Ambulatory Visit (INDEPENDENT_AMBULATORY_CARE_PROVIDER_SITE_OTHER): Payer: Medicare Other | Admitting: Physician Assistant

## 2016-01-27 ENCOUNTER — Telehealth: Payer: Self-pay | Admitting: *Deleted

## 2016-01-27 ENCOUNTER — Encounter: Payer: Self-pay | Admitting: Physician Assistant

## 2016-01-27 VITALS — BP 110/84 | HR 56 | Temp 98.2°F | Resp 16 | Wt 153.0 lb

## 2016-01-27 DIAGNOSIS — N952 Postmenopausal atrophic vaginitis: Secondary | ICD-10-CM

## 2016-01-27 DIAGNOSIS — F331 Major depressive disorder, recurrent, moderate: Secondary | ICD-10-CM | POA: Diagnosis not present

## 2016-01-27 MED ORDER — ESTRADIOL 0.1 MG/GM VA CREA: 1.0000 | TOPICAL_CREAM | VAGINAL | 5 refills | Status: DC

## 2016-01-27 MED ORDER — ESCITALOPRAM OXALATE 10 MG PO TABS: 10.0000 mg | ORAL_TABLET | Freq: Every day | ORAL | 5 refills | Status: DC

## 2016-01-27 NOTE — Progress Notes (Signed)
Patient: [name] Female    DOB: 1961/03/01   55 y.o.   MRN: [id number] Visit Date: 01/27/2016  Today's Provider: Mar Daring, PA-C   Chief Complaint  Patient presents with  . Depression   Subjective:    HPI  Depression, Follow-up  She  was last seen for this 4 weeks ago. Changes made at last visit include added Lexapro to Wellbutrin.   She reports excellent compliance with treatment. She is not having side effects.   She reports excellent tolerance of treatment. Current symptoms include: fatigue She feels she is Improved since last visit.  ------------------------------------------------------------------------  Patient c/o of vaginal dryness and pain during intercourse. Patient reports that ky jelly is not helping.She does not have any vaginal itching, bleeding or discharge. She reports symptoms only occur during intercourse.    Allergies  Allergen Reactions  . Amoxicillin Hives  . Clindamycin Hives  . Codeine Hives  . Sulfa Antibiotics Nausea And Vomiting and Hives       . Shellfish Allergy Hives  . Decadron [Dexamethasone] Other (See Comments)    Hot flashes, insomnia, "manic"  . Papaya Derivatives Hives  . Prednisone     High blood pressure, flushed, mood changes, heart palpitations  . Betadine [Povidone Iodine] Hives       . Chlorhexidine Hives       . Clarithromycin Hives  . Clindamycin/Lincomycin Hives  . Other Hives     Mango      Current Outpatient Prescriptions:  .  albuterol (PROVENTIL HFA;VENTOLIN HFA) 108 (90 BASE) MCG/ACT inhaler, Inhale 2 puffs into the lungs every 6 (six) hours as needed for wheezing., Disp: , Rfl:  .  ALPRAZolam (XANAX) 1 MG tablet, TAKE 1/2 A TABLET BY MOUTH EVERY MORNING, TAKE 1/2 A TABLET AT LUNCH AND 2 TABLETS AT BEDTIME, Disp: 90 tablet, Rfl: 5 .  buPROPion (WELLBUTRIN SR) 150 MG 12 hr tablet, TAKE 1 TABLET BY MOUTH TWICE A DAY, Disp: 60 tablet, Rfl: 5 .  escitalopram (LEXAPRO) 10 MG tablet,  Take 1/2 tab PO q h.s. X 1 week then increase to 1 tab PO q h.s., Disp: 30 tablet, Rfl: 1 .  fluticasone (FLONASE) 50 MCG/ACT nasal spray, Place 2 sprays into the nose daily., Disp: , Rfl:  .  Fluticasone-Salmeterol (ADVAIR DISKUS) 500-50 MCG/DOSE AEPB, Inhale 1 puff into the lungs 2 (two) times daily., Disp: 60 each, Rfl: 5 .  LINZESS 290 MCG CAPS capsule, TAKE ONE CAPSULE BY MOUTH EVERY DAY, Disp: 30 capsule, Rfl: 2 .  loratadine (CLARITIN REDITABS) 10 MG dissolvable tablet, Take 1 tablet (10 mg total) by mouth daily., Disp: 90 tablet, Rfl: 3 .  metoprolol succinate (TOPROL-XL) 50 MG 24 hr tablet, TAKE 1 TABLET BY MOUTH EVERY DAY, Disp: 30 tablet, Rfl: 5 .  montelukast (SINGULAIR) 10 MG tablet, TAKE 1 TABLET BY MOUTH EVERY DAY, Disp: 30 tablet, Rfl: 5 .  omeprazole (PRILOSEC) 20 MG capsule, TAKE ONE CAPSULE BY MOUTH TWICE A DAY, Disp: 60 capsule, Rfl: 5 .  ondansetron (ZOFRAN) 4 MG tablet, Take 1 tablet (4 mg total) by mouth every 8 (eight) hours as needed for nausea or vomiting., Disp: 20 tablet, Rfl: 0 .  Oxycodone HCl 10 MG TABS, Take 1 tablet (10 mg total) by mouth every 6 (six) hours as needed., Disp: 120 tablet, Rfl: 0 .  Spacer/Aero-Holding Chambers (AEROCHAMBER MV) inhaler, by Does not apply route., Disp: , Rfl:  .  valACYclovir (VALTREX) 1000 MG tablet,  TAKE TWO TABLETS BY MOUTH TWICE DAILY FOR ONE DAY FEVER BLISTER (Patient taking differently: as needed. TAKE TWO TABLETS BY MOUTH TWICE DAILY FOR ONE DAY FEVER BLISTER), Disp: 4 tablet, Rfl: 5  Review of Systems  Constitutional: Negative.   Respiratory: Negative.   Cardiovascular: Negative.   Gastrointestinal: Negative for abdominal pain and rectal pain.  Genitourinary: Positive for dyspareunia and vaginal pain.  Musculoskeletal: Negative.   Psychiatric/Behavioral: Negative.     Social History  Substance Use Topics  . Smoking status: Never Smoker  . Smokeless tobacco: Never Used  . Alcohol use No   Objective:   BP 110/84 (BP  Location: Left Arm, Patient Position: Sitting, Cuff Size: Large)   Pulse (!) 56   Temp 98.2 F (36.8 C) (Oral)   Resp 16   Wt 153 lb (69.4 kg)   BMI 26.26 kg/m   Physical Exam  Constitutional: She appears well-developed and well-nourished. No distress.  Neck: Normal range of motion. Neck supple.  Cardiovascular: Normal rate, regular rhythm and normal heart sounds.  Exam reveals no gallop and no friction rub.   No murmur heard. Pulmonary/Chest: Effort normal and breath sounds normal. No respiratory distress. She has no wheezes. She has no rales.  Skin: She is not diaphoretic.  Vitals reviewed.      Assessment & Plan:     1. Vaginitis, atrophic Worsening symptoms. I will give estradiol cream as below. She is to call the office if symptoms still to improve with this treatment. - estradiol (ESTRACE) 0.1 MG/GM vaginal cream; Place 1 Applicatorful vaginally 3 (three) times a week.  Dispense: 42.5 g; Refill: 5  2. Moderate episode of recurrent major depressive disorder (HCC) Improved. Will continue Lexapro and Wellbutrin. She is to call if symptoms worsen. - escitalopram (LEXAPRO) 10 MG tablet; Take 1 tablet (10 mg total) by mouth at bedtime. Take 1/2 tab PO q h.s. X 1 week then increase to 1 tab PO q h.s.  Dispense: 30 tablet; Refill: [place], PA-C  [facility]

## 2016-01-27 NOTE — Patient Instructions (Signed)
Atrophic Vaginitis Introduction Atrophic vaginitis is a condition in which the tissues that line the vagina become dry and thin. This condition is most common in women who have stopped having regular menstrual periods (menopause). This usually starts when a woman is 59-56 years old. Estrogen helps to keep the vagina moist. It stimulates the vagina to produce a clear fluid that lubricates the vagina for sexual intercourse. This fluid also protects the vagina from infection. Lack of estrogen can cause the lining of the vagina to get thinner and dryer. The vagina may also shrink in size. It may become less elastic. Atrophic vaginitis tends to get worse over time as a woman's estrogen level drops. What are the causes? This condition is caused by the normal drop in estrogen that happens around the time of menopause. What increases the risk? Certain conditions or situations may lower a woman's estrogen level, which increases her risk of atrophic vaginitis. These include:  Taking medicine that blocks estrogen.  Having ovaries removed surgically.  Being treated for cancer with X-ray treatment (radiation) or medicines (chemotherapy).  Exercising very hard and often.  Having an eating disorder (anorexia).  Giving birth or breastfeeding.  Being over the age of 25.  Smoking. What are the signs or symptoms? Symptoms of this condition include:  Pain, soreness, or bleeding during sexual intercourse (dyspareunia).  Vaginal burning, irritation, or itching.  Pain or bleeding during a vaginal examination using a speculum (pelvic exam).  Loss of interest in sexual activity.  Having burning pain when passing urine.  Vaginal discharge that is brown or yellow. In some cases, there are no symptoms. How is this diagnosed? This condition is diagnosed with a medical history and physical exam. This will include a pelvic exam that checks whether the inside of your vagina appears pale, thin, or dry.  Rarely, you may also have other tests, including:  A urine test.  A test that checks the acid balance in your vaginal fluid (acid balance test). How is this treated? Treatment for this condition may depend on the severity of your symptoms. Treatment may include:  Using an over-the-counter vaginal lubricant before you have sexual intercourse.  Using a long-acting vaginal moisturizer.  Using low-dose vaginal estrogen for moderate to severe symptoms that do not respond to other treatments. Options include creams, tablets, and inserts (vaginal rings). Before using vaginal estrogen, tell your health care provider if you have a history of:  Breast cancer.  Endometrial cancer.  Blood clots.  Taking medicines. You may be able to take a daily pill for dyspareunia. Discuss all of the risks of this medicine with your health care provider. It is usually not recommended for women who have a family history or personal history of breast cancer. If your symptoms are very mild and you are not sexually active, you may not need treatment. Follow these instructions at home:  Take medicines only as directed by your health care provider. Do not use herbal or alternative medicines unless your health care provider says that you can.  Use over-the-counter creams, lubricants, or moisturizers for dryness only as directed by your health care provider.  If your atrophic vaginitis is caused by menopause, discuss all of your menopausal symptoms and treatment options with your health care provider.  Do not douche.  Do not use products that can make your vagina dry. These include:  Scented feminine sprays.  Scented tampons.  Scented soaps.  If it hurts to have sex, talk with your sexual partner. Contact a  health care provider if:  Your discharge looks different than normal.  Your vagina has an unusual smell.  You have new symptoms.  Your symptoms do not improve with treatment.  Your symptoms get  worse. This information is not intended to replace advice given to you by your health care provider. Make sure you discuss any questions you have with your health care provider. Document Released: 07/14/2014 Document Revised: 08/05/2015 Document Reviewed: 02/18/2014  2017 Elsevier  Estradiol vaginal cream What is this medicine? ESTRADIOL (es tra DYE ole) contains the female hormone estrogen. It is used for symptoms of menopause, like vaginal dryness and irritation. This medicine may be used for other purposes; ask your health care provider or pharmacist if you have questions. COMMON BRAND NAME(S): Estrace What should I tell my health care provider before I take this medicine? They need to know if you have any of these conditions: -abnormal vaginal bleeding -blood vessel disease or blood clots -breast, cervical, endometrial, ovarian, liver, or uterine cancer -dementia -diabetes -gallbladder disease -heart disease or recent heart attack -high blood pressure -high cholesterol -high levels of calcium in the blood -hysterectomy -kidney disease -liver disease -migraine headaches -protein C deficiency -protein S deficiency -stroke -systemic lupus erythematosus (SLE) -tobacco smoker -an unusual or allergic reaction to estrogens, other hormones, soy, other medicines, foods, dyes, or preservatives -pregnant or trying to get pregnant -breast-feeding How should I use this medicine? This medicine is for use in the vagina only. Do not take by mouth. Follow the directions on the prescription label. Read package directions carefully before using. Use the special applicator supplied with the cream. Wash hands before and after use. Fill the applicator with the prescribed amount of cream. Lie on your back, part and bend your knees. Insert the applicator into the vagina and push the plunger to expel the cream into the vagina. Wash the applicator with warm soapy water and rinse well. Use exactly as  directed for the complete length of time prescribed. Do not stop using except on the advice of your doctor or health care professional. A patient package insert for the product will be given with each prescription and refill. Read this sheet carefully each time. The sheet may change frequently. Talk to your pediatrician regarding the use of this medicine in children. This medicine is not approved for use in children. Overdosage: If you think you have taken too much of this medicine contact a poison control center or emergency room at once. NOTE: This medicine is only for you. Do not share this medicine with others. What if I miss a dose? If you miss a dose, use it as soon as you can. If it is almost time for your next dose, use only that dose. Do not use double or extra doses. What may interact with this medicine? Do not take this medicine with any of the following medications: -aromatase inhibitors like aminoglutethimide, anastrozole, exemestane, letrozole, testolactone This medicine may also interact with the following medications: -barbiturates used for inducing sleep or treating seizures -carbamazepine -grapefruit juice -medicines for fungal infections like ketoconazole and itraconazole -raloxifene -rifabutin -rifampin -rifapentine -ritonavir -some antibiotics used to treat infections -St. John's Wort -tamoxifen -warfarin This list may not describe all possible interactions. Give your health care provider a list of all the medicines, herbs, non-prescription drugs, or dietary supplements you use. Also tell them if you smoke, drink alcohol, or use illegal drugs. Some items may interact with your medicine. What should I watch for while using this  medicine? Visit your health care professional for regular checks on your progress. You will need a regular breast and pelvic exam. You should also discuss the need for regular mammograms with your health care professional, and follow his or her  guidelines. This medicine can make your body retain fluid, making your fingers, hands, or ankles swell. Your blood pressure can go up. Contact your doctor or health care professional if you feel you are retaining fluid. If you have any reason to think you are pregnant, stop taking this medicine at once and contact your doctor or health care professional. Tobacco smoking increases the risk of getting a blood clot or having a stroke, especially if you are more than 56 years old. You are strongly advised not to smoke. If you wear contact lenses and notice visual changes, or if the lenses begin to feel uncomfortable, consult your eye care specialist. If you are going to have elective surgery, you may need to stop taking this medicine beforehand. Consult your health care professional for advice prior to scheduling the surgery. What side effects may I notice from receiving this medicine? Side effects that you should report to your doctor or health care professional as soon as possible: -allergic reactions like skin rash, itching or hives, swelling of the face, lips, or tongue -breast tissue changes or discharge -changes in vision -chest pain -confusion, trouble speaking or understanding -dark urine -general ill feeling or flu-like symptoms -light-colored stools -nausea, vomiting -pain, swelling, warmth in the leg -right upper belly pain -severe headaches -shortness of breath -sudden numbness or weakness of the face, arm or leg -trouble walking, dizziness, loss of balance or coordination -unusual vaginal bleeding -yellowing of the eyes or skin Side effects that usually do not require medical attention (report to your doctor or health care professional if they continue or are bothersome): -hair loss -increased hunger or thirst -increased urination -symptoms of vaginal infection like itching, irritation or unusual discharge -unusually weak or tired This list may not describe all possible side  effects. Call your doctor for medical advice about side effects. You may report side effects to FDA at 1-800-FDA-1088. Where should I keep my medicine? Keep out of the reach of children. Store at room temperature between 15 and 30 degrees C (59 and 86 degrees F). Protect from temperatures above 40 degrees C (104 degrees C). Do not freeze. Throw away any unused medicine after the expiration date. NOTE: This sheet is a summary. It may not cover all possible information. If you have questions about this medicine, talk to your doctor, pharmacist, or health care provider.  2017 Elsevier/[employer] Standard (2010-06-01 09:18:12)

## 2016-01-28 ENCOUNTER — Telehealth: Payer: Self-pay

## 2016-01-28 LAB — LIPID PANEL
CHOL/HDL RATIO: 3.1 ratio (ref 0.0–4.4)
Cholesterol, Total: 211 mg/dL — ABNORMAL HIGH (ref 100–199)
HDL: 67 mg/dL (ref >39)
LDL Calculated: 120 mg/dL — ABNORMAL HIGH (ref 0–99)
Triglycerides: 118 mg/dL (ref 0–149)
VLDL Cholesterol Cal: 24 mg/dL (ref 5–40)

## 2016-01-28 LAB — COMPREHENSIVE METABOLIC PANEL
ALBUMIN: 4.3 g/dL (ref 3.5–5.5)
ALK PHOS: 118 IU/L — AB (ref 39–117)
ALT: 8 IU/L (ref 0–32)
AST: 15 IU/L (ref 0–40)
Albumin/Globulin Ratio: 1.7 (ref 1.2–2.2)
BILIRUBIN TOTAL: 0.6 mg/dL (ref 0.0–1.2)
BUN / CREAT RATIO: 13 (ref 9–23)
BUN: 11 mg/dL (ref 6–24)
CHLORIDE: 99 mmol/L (ref 96–106)
CO2: 24 mmol/L (ref 18–29)
Calcium: 9.5 mg/dL (ref 8.7–10.2)
Creatinine, Ser: 0.85 mg/dL (ref 0.57–1.00)
GFR calc Af Amer: 89 mL/min/{1.73_m2} (ref >59)
GFR calc non Af Amer: 77 mL/min/{1.73_m2} (ref >59)
GLOBULIN, TOTAL: 2.6 g/dL (ref 1.5–4.5)
GLUCOSE: 82 mg/dL (ref 65–99)
Potassium: 4.4 mmol/L (ref 3.5–5.2)
SODIUM: 138 mmol/L (ref 134–144)
Total Protein: 6.9 g/dL (ref 6.0–8.5)

## 2016-01-28 LAB — CBC WITH DIFFERENTIAL/PLATELET
BASOS ABS: 0.1 10*3/uL (ref 0.0–0.2)
Basos: 2 %
EOS (ABSOLUTE): 0 10*3/uL (ref 0.0–0.4)
Eos: 1 %
HEMOGLOBIN: 13.1 g/dL (ref 11.1–15.9)
Hematocrit: 38.4 % (ref 34.0–46.6)
Immature Grans (Abs): 0 10*3/uL (ref 0.0–0.1)
Immature Granulocytes: 0 %
LYMPHS ABS: 2.1 10*3/uL (ref 0.7–3.1)
Lymphs: 42 %
MCH: 28.5 pg (ref 26.6–33.0)
MCHC: 34.1 g/dL (ref 31.5–35.7)
MCV: 84 fL (ref 79–97)
MONOCYTES: 4 %
MONOS ABS: 0.2 10*3/uL (ref 0.1–0.9)
NEUTROS ABS: 2.6 10*3/uL (ref 1.4–7.0)
Neutrophils: 51 %
Platelets: 305 10*3/uL (ref 150–379)
RBC: 4.59 x10E6/uL (ref 3.77–5.28)
RDW: 13.9 % (ref 12.3–15.4)
WBC: 5 10*3/uL (ref 3.4–10.8)

## 2016-01-28 LAB — TSH: TSH: 1.73 u[IU]/mL (ref 0.450–4.500)

## 2016-01-28 LAB — HEPATITIS C ANTIBODY

## 2016-01-28 LAB — HEMOGLOBIN A1C
ESTIMATED AVERAGE GLUCOSE: 100 mg/dL
HEMOGLOBIN A1C: 5.1 % (ref 4.8–5.6)

## 2016-01-28 NOTE — Telephone Encounter (Signed)
-----   Message from Mar Daring, [place] sent at 01/28/2016  1:49 PM EST ----- All labs are within normal limits and stable.  Cholesterol is borderline high but cardiovascular risk is low. We will continue to monitor annually. Thanks! -JB

## 2016-01-28 NOTE — Telephone Encounter (Signed)
Patient advised as directed below.  Thanks,  -Boluwatife Mutchler 

## 2016-02-07 NOTE — Telephone Encounter (Signed)
No prior auth required for shoulder injection [employer]  Okay to schedule

## 2016-02-17 ENCOUNTER — Other Ambulatory Visit: Payer: Self-pay | Admitting: Physician Assistant

## 2016-02-17 ENCOUNTER — Ambulatory Visit
Admission: RE | Admit: 2016-02-17 | Discharge: 2016-02-17 | Disposition: A | Discharge status: Home / Self Care | Payer: Medicare Other | Source: Ambulatory Visit | Attending: Physician Assistant | Admitting: Physician Assistant

## 2016-02-17 DIAGNOSIS — Z1239 Encounter for other screening for malignant neoplasm of breast: Secondary | ICD-10-CM

## 2016-02-17 DIAGNOSIS — Z1231 Encounter for screening mammogram for malignant neoplasm of breast: Secondary | ICD-10-CM | POA: Diagnosis present

## 2016-02-29 ENCOUNTER — Other Ambulatory Visit: Payer: Self-pay | Admitting: Family Medicine

## 2016-03-03 ENCOUNTER — Other Ambulatory Visit: Payer: Self-pay | Admitting: Family Medicine

## 2016-03-21 ENCOUNTER — Other Ambulatory Visit: Payer: Self-pay | Admitting: Physician Assistant

## 2016-03-21 DIAGNOSIS — F411 Generalized anxiety disorder: Secondary | ICD-10-CM

## 2016-03-22 NOTE — Telephone Encounter (Signed)
RX called in at [facility]  

## 2016-04-07 ENCOUNTER — Other Ambulatory Visit: Payer: Self-pay | Admitting: Family Medicine

## 2016-04-07 DIAGNOSIS — K219 Gastro-esophageal reflux disease without esophagitis: Secondary | ICD-10-CM

## 2016-04-07 NOTE — Telephone Encounter (Signed)
Please review-aa 

## 2016-04-13 ENCOUNTER — Ambulatory Visit: Payer: Medicare Other | Attending: Pain Medicine | Admitting: Pain Medicine

## 2016-04-13 ENCOUNTER — Encounter: Payer: Self-pay | Admitting: Pain Medicine

## 2016-04-13 VITALS — BP 141/98 | HR 67 | Temp 98.0°F | Resp 16 | Ht 64.0 in | Wt 150.0 lb

## 2016-04-13 DIAGNOSIS — Z87442 Personal history of urinary calculi: Secondary | ICD-10-CM | POA: Diagnosis not present

## 2016-04-13 DIAGNOSIS — Z9889 Other specified postprocedural states: Secondary | ICD-10-CM | POA: Diagnosis not present

## 2016-04-13 DIAGNOSIS — Z90710 Acquired absence of both cervix and uterus: Secondary | ICD-10-CM | POA: Insufficient documentation

## 2016-04-13 DIAGNOSIS — M545 Low back pain: Secondary | ICD-10-CM | POA: Diagnosis not present

## 2016-04-13 DIAGNOSIS — Z882 Allergy status to sulfonamides status: Secondary | ICD-10-CM | POA: Insufficient documentation

## 2016-04-13 DIAGNOSIS — Z91018 Allergy to other foods: Secondary | ICD-10-CM | POA: Insufficient documentation

## 2016-04-13 DIAGNOSIS — Z9049 Acquired absence of other specified parts of digestive tract: Secondary | ICD-10-CM | POA: Diagnosis not present

## 2016-04-13 DIAGNOSIS — M1610 Unilateral primary osteoarthritis, unspecified hip: Secondary | ICD-10-CM | POA: Diagnosis not present

## 2016-04-13 DIAGNOSIS — F1190 Opioid use, unspecified, uncomplicated: Secondary | ICD-10-CM

## 2016-04-13 DIAGNOSIS — R51 Headache: Secondary | ICD-10-CM | POA: Insufficient documentation

## 2016-04-13 DIAGNOSIS — M21371 Foot drop, right foot: Secondary | ICD-10-CM | POA: Diagnosis not present

## 2016-04-13 DIAGNOSIS — Z96641 Presence of right artificial hip joint: Secondary | ICD-10-CM | POA: Diagnosis not present

## 2016-04-13 DIAGNOSIS — K589 Irritable bowel syndrome without diarrhea: Secondary | ICD-10-CM | POA: Insufficient documentation

## 2016-04-13 DIAGNOSIS — M25559 Pain in unspecified hip: Secondary | ICD-10-CM

## 2016-04-13 DIAGNOSIS — Z981 Arthrodesis status: Secondary | ICD-10-CM | POA: Diagnosis not present

## 2016-04-13 DIAGNOSIS — Z881 Allergy status to other antibiotic agents status: Secondary | ICD-10-CM | POA: Insufficient documentation

## 2016-04-13 DIAGNOSIS — Z880 Allergy status to penicillin: Secondary | ICD-10-CM | POA: Diagnosis not present

## 2016-04-13 DIAGNOSIS — E871 Hypo-osmolality and hyponatremia: Secondary | ICD-10-CM | POA: Insufficient documentation

## 2016-04-13 DIAGNOSIS — Z91013 Allergy to seafood: Secondary | ICD-10-CM | POA: Diagnosis not present

## 2016-04-13 DIAGNOSIS — G8929 Other chronic pain: Secondary | ICD-10-CM

## 2016-04-13 DIAGNOSIS — M25512 Pain in left shoulder: Secondary | ICD-10-CM | POA: Diagnosis not present

## 2016-04-13 DIAGNOSIS — M25511 Pain in right shoulder: Secondary | ICD-10-CM | POA: Diagnosis not present

## 2016-04-13 DIAGNOSIS — Z79891 Long term (current) use of opiate analgesic: Secondary | ICD-10-CM | POA: Insufficient documentation

## 2016-04-13 DIAGNOSIS — J45909 Unspecified asthma, uncomplicated: Secondary | ICD-10-CM | POA: Diagnosis not present

## 2016-04-13 DIAGNOSIS — Z820 Family history of epilepsy and other diseases of the nervous system: Secondary | ICD-10-CM | POA: Insufficient documentation

## 2016-04-13 DIAGNOSIS — G894 Chronic pain syndrome: Secondary | ICD-10-CM | POA: Insufficient documentation

## 2016-04-13 DIAGNOSIS — F329 Major depressive disorder, single episode, unspecified: Secondary | ICD-10-CM | POA: Diagnosis not present

## 2016-04-13 DIAGNOSIS — Z803 Family history of malignant neoplasm of breast: Secondary | ICD-10-CM | POA: Insufficient documentation

## 2016-04-13 DIAGNOSIS — I10 Essential (primary) hypertension: Secondary | ICD-10-CM | POA: Insufficient documentation

## 2016-04-13 DIAGNOSIS — Z888 Allergy status to other drugs, medicaments and biological substances status: Secondary | ICD-10-CM | POA: Insufficient documentation

## 2016-04-13 MED ORDER — OXYCODONE HCL 10 MG PO TABS: 10.0000 mg | ORAL_TABLET | Freq: Four times a day (QID) | ORAL | 0 refills | Status: DC | PRN

## 2016-04-13 NOTE — Progress Notes (Signed)
Nursing Pain Medication Assessment:  Safety precautions to be maintained throughout the outpatient stay will include: orient to surroundings, keep bed in low position, maintain call bell within reach at all times, provide assistance with transfer out of bed and ambulation.  Medication Inspection Compliance: Pill count conducted under aseptic conditions, in front of the patient. Neither the pills nor the bottle was removed from the patient's sight at any time. Once count was completed pills were immediately returned to the patient in their original bottle.  Medication: See above Pill/Patch Count: 0 of 120 pills remain Bottle Appearance: Standard pharmacy container. Clearly labeled. Filled Date: 67 / 31 / 2017 Last Medication intake:  Yesterday at 0800

## 2016-04-13 NOTE — Patient Instructions (Signed)

## 2016-04-13 NOTE — Progress Notes (Signed)
Patient's Name: [name]  MRN: [id number]  Referring Provider: Mar Daring, [place]*  DOB: 10-23-1960  PCP: Jerrol Banana., MD  DOS: 04/13/2016  Note by: Kathlen Brunswick. Dossie Arbour, MD  Service setting: Ambulatory outpatient  Specialty: Interventional Pain Management  Location: [facility] (AMB) Pain Management Facility    Patient type: Established   Primary Reason(s) for Visit: Encounter for prescription drug management (Level of risk: moderate) CC: Shoulder Pain (left)  HPI  [name] is a 56 y.o. year old, female patient, who comes today for a medication management evaluation. She has Chronic neck pain; Anxiety state; Asthma, mild; Blurred vision; Benign paroxysmal positional nystagmus; Congenital renal agenesis and dysgenesis; Clinical depression; Colon, diverticulosis; Fatigue; Cannot sleep; IBS (irritable bowel syndrome); Awareness of heartbeats; RAD (reactive airway disease); Apnea, sleep; Esophagitis, reflux; DOE (dyspnea on exertion); Chronic left shoulder pain; Chronic right hip pain; Encounter for therapeutic drug level monitoring; Long term current use of opiate analgesic; Long term prescription opiate use; Opiate use (60 MME/Day); Chronic pain syndrome; Steroid intolerance; Cervical spine pain; Cervical spondylosis; Cervical facet arthropathy (bilateral); History of cervical spinal surgery; Chronic shoulder pain (Location of Secondary source of pain) (Bilateral) (L>R); 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 Her primarily concern today is the Shoulder Pain (left)  Pain Assessment: Self-Reported Pain Score: 2 /10             Reported level is compatible with observation.       Pain Type: Chronic pain Pain Location: Shoulder Pain Orientation: Left Pain Descriptors / Indicators: Aching, Sharp Pain Frequency: Constant  [name] was last seen on 01/12/2016 for medication management. During today's appointment we reviewed [name] chronic pain status, as well as her outpatient medication regimen.  The patient  reports that she does not use drugs. Her body mass index is 25.75 kg/m.  Further details on both, my assessment(s), as well as the proposed treatment plan, please see below.  Controlled Substance Pharmacotherapy Assessment REMS (Risk Evaluation and Mitigation Strategy)  Analgesic:Oxycodone IR 10 mg every 6 hours (40 mg/day) MME/day:60 mg/day Angelique Holm, RN  04/13/2016  8:53 AM  Sign at close encounter Nursing Pain Medication Assessment:  Safety precautions to be maintained throughout the outpatient stay will include: orient to surroundings, keep bed in low position, maintain call bell within reach at all times, provide assistance with transfer out of bed and ambulation.  Medication Inspection Compliance: Pill count conducted under aseptic conditions, in front of the patient. Neither the pills nor the bottle was removed from the patient's sight at any time. Once count was completed pills were immediately returned to the patient in their original bottle.  Medication: See above Pill/Patch Count: 0 of 120 pills remain Bottle Appearance: Standard pharmacy container. Clearly labeled. Filled Date: 34 / 31 / 2017 Last Medication intake:  Yesterday at 0800   Pharmacokinetics: Liberation and absorption (onset of action): WNL Distribution (time to peak effect):  WNL Metabolism and excretion (duration of action): WNL  Pharmacodynamics: Desired effects: Analgesia: [name] reports >50% benefit. Functional ability: Patient reports that medication allows her to accomplish basic ADLs Clinically meaningful improvement in function (CMIF): Sustained CMIF goals met Perceived effectiveness: Described as relatively effective, allowing for increase in activities of daily living (ADL) Undesirable effects: Side-effects or Adverse reactions: None reported Monitoring: [place] PMP: Online review of the past 96-monthperiod conducted. Compliant with practice rules and regulations List of all UDS test(s) done:  Lab Results  Component Value Date   TOXASSSELUR FINAL 10/07/2015   TOXASSSELUR FINAL 07/02/2015   TClaflinFINAL 04/05/2015   Last UDS on record: ToxAssure Select 13  Date Value Ref Range Status  10/07/2015 FINAL  Final    Comment:    ==================================================================== TOXASSURE SELECT 13 (MW) ==================================================================== Test                             Result       Flag       Units Drug Present and Declared for Prescription Verification   Alprazolam                     340          EXPECTED   ng/mg creat   Alpha-hydroxyalprazolam        388          EXPECTED   ng/mg creat    Source of alprazolam is a scheduled prescription medication.    Alpha-hydroxyalprazolam is an expected metabolite of alprazolam.   Oxycodone                      5844         EXPECTED   ng/mg creat   Oxymorphone                    232          EXPECTED   ng/mg creat   Noroxycodone                   14540        EXPECTED   ng/mg creat    Sources of oxycodone include scheduled prescription medications.    Oxymorphone and noroxycodone are expected metabolites of    oxycodone. Oxymorphone is also available as a scheduled    prescription  medication. ==================================================================== Test                      Result    Flag   Units      Ref Range   Creatinine              25               mg/dL      >=20 ==================================================================== Declared Medications:  The flagging and interpretation on this report are based on the  following declared medications.  Unexpected results may arise from  inaccuracies in the declared medications.  **Note: The testing scope of this panel includes these medications:  Alprazolam (Xanax)  Oxycodone  **Note: The testing scope of this panel does not include following  reported medications:  Albuterol (Proventil)  Bupropion (Wellbutrin)  Fluticasone (Advair)  Fluticasone (Flonase)  Linaclotide (Linzess)  Loratadine (Claritin)  Metoprolol  Montelukast (Singulair)  Omeprazole  Salmeterol (Advair)  Valacyclovir (Valtrex) ==================================================================== For clinical consultation, please call [phone] ====================================================================    UDS interpretation: Compliant  Medication Assessment Form: Reviewed. Patient indicates being compliant with therapy Treatment compliance: Compliant Risk Assessment Profile: Aberrant behavior: See prior evaluations. None observed or detected today Comorbid factors increasing risk of overdose: See prior notes. No additional risks detected today Risk of substance use disorder (SUD): Low Opioid Risk Tool (ORT) Total Score:    Interpretation Table:  Score <3 = Low Risk for SUD  Score between 4-7 = Moderate Risk for SUD  Score >8 = High Risk for Opioid Abuse   Risk Mitigation Strategies:  Patient Counseling: Covered Patient-Prescriber Agreement (PPA): Present and active  Notification to other healthcare providers: Done  Pharmacologic Plan: No change in therapy, at this time  Laboratory Chemistry   Inflammation Markers Lab Results  Component Value Date   ESRSEDRATE 37 (H) 04/05/2015   CRP 0.5 04/05/2015   Renal Function Lab Results  Component Value Date   BUN 11 01/27/2016   CREATININE 0.85 01/27/2016   GFRAA 89 01/27/2016   GFRNONAA 77 01/27/2016   Hepatic Function Lab Results  Component Value Date   AST 15 01/27/2016   ALT 8 01/27/2016   ALBUMIN 4.3 01/27/2016   Electrolytes Lab Results  Component Value Date   NA 138 01/27/2016   K 4.4 01/27/2016   CL 99 01/27/2016   CALCIUM 9.5 01/27/2016   MG 2.1 04/05/2015   Pain Modulating Vitamins No results found for: Maralyn Sago [id number], [id number], [id number], 25OHVITD1, 25OHVITD2, 25OHVITD3, VITAMINB12 Coagulation Parameters Lab Results  Component Value Date   INR 1.05 03/06/2015   LABPROT 13.9 03/06/2015   APTT 33 03/06/2015   PLT 305 01/27/2016   Cardiovascular Lab Results  Component Value Date   HGB 14.2 04/06/2015   HCT 38.4 01/27/2016   Note: Lab results reviewed.  Recent Diagnostic Imaging Review  Mm Screening Breast Tomo Bilateral  Result Date: 02/18/2016 CLINICAL DATA:  Screening. EXAM: 2D DIGITAL SCREENING BILATERAL MAMMOGRAM WITH CAD AND ADJUNCT TOMO COMPARISON:  Previous exam(s). ACR Breast Density Category b: There are scattered areas of fibroglandular density. FINDINGS: There are no findings suspicious for malignancy. Images were processed with CAD. IMPRESSION: No mammographic evidence of malignancy. A result letter of this screening mammogram will be mailed directly to the patient. RECOMMENDATION: Screening mammogram in one year. (Code:SM-B-01Y) BI-RADS CATEGORY  1: Negative. Electronically Signed   By: Franki Cabot M.D.   On: 02/18/2016 09:01   Note: Imaging results reviewed.          Meds  The patient has a current medication list which includes the following prescription(s): albuterol, alprazolam, bupropion, escitalopram, estradiol, fluticasone, fluticasone-salmeterol, linzess, loratadine,  metoprolol succinate, montelukast, omeprazole, ondansetron, oxycodone hcl, oxycodone hcl, oxycodone hcl, aerochamber mv, and valacyclovir.  Current Outpatient Prescriptions on File Prior to Visit  Medication Sig  . albuterol (PROVENTIL HFA;VENTOLIN HFA) 108 (90 BASE) MCG/ACT inhaler Inhale 2 puffs into the lungs every 6 (six) hours as needed for wheezing.  Marland Kitchen ALPRAZolam (XANAX) 1 MG tablet TAKE 1/2 TAB BY MOUTH EVERY MORNING, 1/2 TAB AT LUNCH, AND 2 TABS AT BEDTIME  . buPROPion (WELLBUTRIN SR) 150 MG 12 hr tablet TAKE 1 TABLET BY MOUTH TWICE A DAY  . escitalopram (LEXAPRO) 10 MG tablet Take 1 tablet (10 mg total) by mouth at bedtime. Take 1/2 tab PO q h.s. X 1 week then increase to 1 tab PO q h.s.  . estradiol (ESTRACE) 0.1 MG/GM vaginal cream Place 1 Applicatorful vaginally 3 (three) times a week.  . fluticasone (FLONASE) 50 MCG/ACT nasal spray Place 2 sprays into the  nose daily.  . Fluticasone-Salmeterol (ADVAIR DISKUS) 500-50 MCG/DOSE AEPB Inhale 1 puff into the lungs 2 (two) times daily.  Marland Kitchen LINZESS 290 MCG CAPS capsule TAKE ONE CAPSULE BY MOUTH EVERY DAY  . loratadine (CLARITIN REDITABS) 10 MG dissolvable tablet Take 1 tablet (10 mg total) by mouth daily.  . metoprolol succinate (TOPROL-XL) 50 MG 24 hr tablet TAKE 1 TABLET BY MOUTH EVERY DAY  . montelukast (SINGULAIR) 10 MG tablet TAKE 1 TABLET BY MOUTH EVERY DAY  . omeprazole (PRILOSEC) 20 MG capsule TAKE ONE CAPSULE BY MOUTH TWICE A DAY  . ondansetron (ZOFRAN) 4 MG tablet Take 1 tablet (4 mg total) by mouth every 8 (eight) hours as needed for nausea or vomiting.  Marland Kitchen Spacer/Aero-Holding Chambers (AEROCHAMBER MV) inhaler by Does not apply route.  . valACYclovir (VALTREX) 1000 MG tablet TAKE TWO TABLETS BY MOUTH TWICE DAILY FOR ONE DAY FEVER BLISTER (Patient taking differently: as needed. TAKE TWO TABLETS BY MOUTH TWICE DAILY FOR ONE DAY FEVER BLISTER)   No current facility-administered medications on file prior to visit.    ROS   Constitutional: Denies any fever or chills Gastrointestinal: No reported hemesis, hematochezia, vomiting, or acute GI distress Musculoskeletal: Denies any acute onset joint swelling, redness, loss of ROM, or weakness Neurological: No reported episodes of acute onset apraxia, aphasia, dysarthria, agnosia, amnesia, paralysis, loss of coordination, or loss of consciousness  Allergies  [name] is allergic to amoxicillin; clindamycin; codeine; sulfa antibiotics; shellfish allergy; decadron [dexamethasone]; papaya derivatives; prednisone; betadine [povidone iodine]; chlorhexidine; clarithromycin; clindamycin/lincomycin; and other.  PFSH  Drug: [name]  reports that she does not use drugs. Alcohol:  reports that she does not drink alcohol. Tobacco:  reports that she has never smoked. She has never used smokeless tobacco. 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); Anemia; Anxiety; Asthma; Cephalalgia (08/18/2014); Cervical disc disease (08/18/2014); Chronic headaches; DDD (degenerative disc disease); Depression; Dizziness (04/22/2013); Duodenal ulcer with hemorrhage and perforation ([place]) (04/27/2003); GERD (gastroesophageal reflux disease); H/O arthrodesis (08/18/2014); Headache(784.0); History of blood transfusion; History of cervical spinal surgery (01/04/2015); History of kidney stones; Hypertension; Inverted T wave; Narrowing of intervertebral disc space (08/18/2014); Orthostatic hypotension (04/22/2013); Pain; Pneumonia; PONV (postoperative nausea and vomiting); Postop Hyponatremia (05/14/2012); Postoperative anemia due to acute blood loss (05/14/2012); Right foot drop; and Right hip arthralgia (08/18/2014). Family: family history includes Aneurysm in her maternal grandmother and mother; Breast cancer (age of onset: 26) in her paternal grandmother.  Past Surgical History:  Procedure Laterality Date  . ABDOMINAL HYSTERECTOMY    . ANTERIOR CERVICAL DECOMP/DISCECTOMY FUSION  N/A 10/30/2012   Procedure: ACDF C5-6, EXPLORATION AND HARDWARE REMOVAL C6-7;  Surgeon: Melina Schools, MD;  Location: [facility];  Service: Orthopedics;  Laterality: N/A;  . ANTERIOR FUSION CERVICAL SPINE  MAY 2012   AT [facility]  . BACK SURGERY  2009   LUMBAR FUSION WITH RODS   . BREAST BIOPSY Right 2008   benign.- bx/clip  . BREAST EXCISIONAL BIOPSY Left 1998   benign  . CARPAL TUNNEL RELEASE  05-06-12   Right  . CHOLECYSTECTOMY    . DIAGNOSTIC LAPAROSCOPIES - MULTIPLE FOR ENDOMETRIOSIS    . HIP ARTHROSCOPY  09/20/2011   Procedure: ARTHROSCOPY HIP;  Surgeon: Gearlean Alf, MD;  Location: [facility];  Service: Orthopedics;  Laterality: Right;  Right Hip Scope with Labral Debridement  . NASAL SEPTUM SURGERY  MARCH 2013   IN [place]  . POSTERIOR CERVICAL FUSION/FORAMINOTOMY N/A 04/16/2013   Procedure: REMOVAL CERVICAL  PLATES AND INTERBODY CAGE/POSTERIOR CERVICAL SPINAL FUSION C4 - C6/C5 CORPECTOMY/C4 - C6 FUSION WITH ILIAC CREST BONE GRAFT;  Surgeon: Melina Schools, MD;  Location: [facility];  Service: Orthopedics;  Laterality: N/A;  . RIGHT HIP ARTHROSCOPY FOR LABRAL TEAR  ABOUT 2010   2012 also  . SHOULDER ARTHROSCOPY  05-06-12   bone spur  . TONSILLECTOMY     AS A CHILD  . TOTAL HIP ARTHROPLASTY Right 05/13/2012   Procedure: TOTAL HIP ARTHROPLASTY ANTERIOR APPROACH;  Surgeon: Gearlean Alf, MD;  Location: [facility];  Service: Orthopedics;  Laterality: Right;   Constitutional Exam  General appearance: Well nourished, well developed, and well hydrated. In no apparent acute distress Vitals:   04/13/16 0846  BP: (!) 141/98  Pulse: 67  Resp: 16  Temp: 98 F (36.7 C)  TempSrc: Oral  SpO2: 99%  Weight: 150 lb (68 kg)  Height: 5' 4" (1.626 m)   BMI Assessment: Estimated body mass index is 25.75 kg/m as calculated from the following:   Height as of this encounter: 5' 4" (1.626 m).   Weight as of this encounter: 150 lb (68 kg).  BMI interpretation table: BMI level Category Range association with  higher incidence of chronic pain  <18 kg/m2 Underweight   18.5-24.9 kg/m2 Ideal body weight   25-29.9 kg/m2 Overweight Increased incidence by 20%  30-34.9 kg/m2 Obese (Class I) Increased incidence by 68%  35-39.9 kg/m2 Severe obesity (Class II) Increased incidence by 136%  >40 kg/m2 Extreme obesity (Class III) Increased incidence by 254%   BMI Readings from Last 4 Encounters:  04/13/16 25.75 kg/m  01/27/16 26.26 kg/m  01/12/16 26.61 kg/m  12/30/15 26.61 kg/m   Wt Readings from Last 4 Encounters:  04/13/16 150 lb (68 kg)  01/27/16 153 lb (69.4 kg)  01/12/16 155 lb (70.3 kg)  12/30/15 155 lb (70.3 kg)  Psych/Mental status: Alert, oriented x 3 (person, place, & time)       Eyes: PERLA Respiratory: No evidence of acute respiratory distress  Cervical Spine Exam  Inspection: No masses, redness, or swelling Alignment: Symmetrical Functional ROM: Unrestricted ROM Stability: No instability detected Muscle strength & Tone: Functionally intact Sensory: Unimpaired Palpation: Non-contributory  Upper Extremity (UE) Exam    Side: Right upper extremity  Side: Left upper extremity  Inspection: No masses, redness, swelling, or asymmetry  Inspection: No masses, redness, swelling, or asymmetry  Functional ROM: Decreased ROM for shoulder joint  Functional ROM: Decreased ROM for shoulder joint  Muscle strength & Tone: Functionally intact  Muscle strength & Tone: Functionally intact  Sensory: Movement-associated pain  Sensory: Movement-associated pain  Palpation: Non-contributory  Palpation: Non-contributory   Thoracic Spine Exam  Inspection: No masses, redness, or swelling Alignment: Symmetrical Functional ROM: Unrestricted ROM Stability: No instability detected Sensory: Unimpaired Muscle strength & Tone: Functionally intact Palpation: Non-contributory  Lumbar Spine Exam  Inspection: No masses, redness, or swelling Alignment: Symmetrical Functional ROM: Unrestricted ROM Stability:  No instability detected Muscle strength & Tone: Functionally intact Sensory: Unimpaired Palpation: Non-contributory Provocative Tests: Lumbar Hyperextension and rotation test: evaluation deferred today       Patrick's Maneuver: evaluation deferred today              Gait & Posture Assessment  Ambulation: Unassisted Gait: Relatively normal for age and body habitus Posture: WNL   Lower Extremity Exam    Side: Right lower extremity  Side: Left lower extremity  Inspection: No masses, redness, swelling, or asymmetry  Inspection: No masses, redness, swelling,  or asymmetry  Functional ROM: Unrestricted ROM          Functional ROM: Unrestricted ROM          Muscle strength & Tone: Functionally intact  Muscle strength & Tone: Functionally intact  Sensory: Unimpaired  Sensory: Unimpaired  Palpation: Non-contributory  Palpation: Non-contributory   Assessment  Primary Diagnosis & Pertinent Problem List: The primary encounter diagnosis was Chronic pain syndrome. Diagnoses of Chronic hip pain, unspecified laterality, Chronic shoulder pain (Location of Secondary source of pain) (Bilateral) (L>R), Chronic low back pain (Location of Tertiary source of pain) (Bilateral) (L>R), Long term current use of opiate analgesic, and Opiate use (60 MME/Day) were also pertinent to this visit.  Status Diagnosis  Controlled Controlled Controlled 1. Chronic pain syndrome   2. Chronic hip pain, unspecified laterality   3. Chronic shoulder pain (Location of Secondary source of pain) (Bilateral) (L>R)   4. Chronic low back pain (Location of Tertiary source of pain) (Bilateral) (L>R)   5. Long term current use of opiate analgesic   6. Opiate use (60 MME/Day)      Plan of Care  Pharmacotherapy (Medications Ordered): Meds ordered this encounter  Medications  . Oxycodone HCl 10 MG TABS    Sig: Take 1 tablet (10 mg total) by mouth every 6 (six) hours as needed.    Dispense:  120 tablet    Refill:  0    Do not  place this medication, or any other prescription from our practice, on "Automatic Refill". Patient may have prescription filled one day early if pharmacy is closed on scheduled refill date. Do not fill until: 04/13/16 To last until: 05/13/16  . Oxycodone HCl 10 MG TABS    Sig: Take 1 tablet (10 mg total) by mouth every 6 (six) hours as needed.    Dispense:  120 tablet    Refill:  0    Do not place this medication, or any other prescription from our practice, on "Automatic Refill". Patient may have prescription filled one day early if pharmacy is closed on scheduled refill date. Do not fill until: 05/13/16 To last until: 06/12/16  . Oxycodone HCl 10 MG TABS    Sig: Take 1 tablet (10 mg total) by mouth every 6 (six) hours as needed.    Dispense:  120 tablet    Refill:  0    Do not place this medication, or any other prescription from our practice, on "Automatic Refill". Patient may have prescription filled one day early if pharmacy is closed on scheduled refill date. Do not fill until: 06/12/16 To last until: 07/12/16   New Prescriptions   No medications on file   Medications administered today: [name] had no medications administered during this visit. Lab-work, procedure(s), and/or referral(s): Orders Placed This Encounter  Procedures  . SUPRASCAPULAR NERVE BLOCK   Imaging and/or referral(s): None  Interventional therapies: Planned, scheduled, and/or pending:   Diagnostic bilateral suprascapular nerve block under fluoroscopic guidance and IV sedation.    Considering:   Diagnostic bilateral suprascapular nerve block under fluoroscopic guidance and IV sedation.    Palliative PRN treatment(s):   Diagnostic bilateral suprascapular nerve block under fluoroscopic guidance and IV sedation.    Provider-requested follow-up: Return in about 3 months (around 07/11/2016) for (NP) Med-Mgmt, in addition, procedure (ASAA).  No future appointments. Primary Care Physician: Jerrol Banana., MD Location: [facility] Outpatient [facility] Management Facility Note by: Kathlen Brunswick. Dossie Arbour, M.D, DABA, DABAPM, DABPM, DABIPP, FIPP Date: 04/13/2016; Time:  9:48 AM  Pain Score Disclaimer: We use the NRS-11 scale. This is a self-reported, subjective measurement of pain severity with only modest accuracy. It is used primarily to identify changes within a particular patient. It must be understood that outpatient pain scales are significantly less accurate that those used for research, where they can be applied under ideal controlled circumstances with minimal exposure to variables. In reality, the score is likely to be a combination of pain intensity and pain affect, where pain affect describes the degree of emotional arousal or changes in action readiness caused by the sensory experience of pain. Factors such as social and work situation, setting, emotional state, anxiety levels, expectation, and prior pain experience may influence pain perception and show large inter-individual differences that may also be affected by time variables.  Patient instructions provided during this appointment: Patient Instructions   GENERAL RISKS AND COMPLICATIONS  What are the risk, side effects and possible complications? Generally speaking, most procedures are safe.  However, with any procedure there are risks, side effects, and the possibility of complications.  The risks and complications are dependent upon the sites that are lesioned, or the type of nerve block to be performed.  The closer the procedure is to the spine, the more serious the risks are.  Great care is taken when placing the radio frequency needles, block needles or lesioning probes, but sometimes complications can occur. 1. Infection: Any time there is an injection through the skin, there is a risk of infection.  This is why sterile conditions are used for these blocks.  There are four possible types of infection. 1. Localized skin  infection. 2. Central Nervous System Infection-This can be in the form of Meningitis, which can be deadly. 3. Epidural Infections-This can be in the form of an epidural abscess, which can cause pressure inside of the spine, causing compression of the spinal cord with subsequent paralysis. This would require an emergency surgery to decompress, and there are no guarantees that the patient would recover from the paralysis. 4. Discitis-This is an infection of the intervertebral discs.  It occurs in about 1% of discography procedures.  It is difficult to treat and it may lead to surgery.        2. Pain: the needles have to go through skin and soft tissues, will cause soreness.       3. Damage to internal structures:  The nerves to be lesioned may be near blood vessels or    other nerves which can be potentially damaged.       4. Bleeding: Bleeding is more common if the patient is taking blood thinners such as  aspirin, Coumadin, Ticiid, Plavix, etc., or if he/she have some genetic predisposition  such as hemophilia. Bleeding into the spinal canal can cause compression of the spinal  cord with subsequent paralysis.  This would require an emergency surgery to  decompress and there are no guarantees that the patient would recover from the  paralysis.       5. Pneumothorax:  Puncturing of a lung is a possibility, every time a needle is introduced in  the area of the chest or upper back.  Pneumothorax refers to free air around the  collapsed lung(s), inside of the thoracic cavity (chest cavity).  Another two possible  complications related to a similar event would include: Hemothorax and Chylothorax.   These are variations of the Pneumothorax, where instead of air around the collapsed  lung(s), you may have blood or chyle,  respectively.       6. Spinal headaches: They may occur with any procedures in the area of the spine.       7. Persistent CSF (Cerebro-Spinal Fluid) leakage: This is a rare problem, but may occur   with prolonged intrathecal or epidural catheters either due to the formation of a fistulous  track or a dural tear.       8. Nerve damage: By working so close to the spinal cord, there is always a possibility of  nerve damage, which could be as serious as a permanent spinal cord injury with  paralysis.       9. Death:  Although rare, severe deadly allergic reactions known as "Anaphylactic  reaction" can occur to any of the medications used.      10. Worsening of the symptoms:  We can always make thing worse.  What are the chances of something like this happening? Chances of any of this occuring are extremely low.  By statistics, you have more of a chance of getting killed in a motor vehicle accident: while driving to the hospital than any of the above occurring .  Nevertheless, you should be aware that they are possibilities.  In general, it is similar to taking a shower.  Everybody knows that you can slip, hit your head and get killed.  Does that mean that you should not shower again?  Nevertheless always keep in mind that statistics do not mean anything if you happen to be on the wrong side of them.  Even if a procedure has a 1 (one) in a 1,000,000 (million) chance of going wrong, it you happen to be that one..Also, keep in mind that by statistics, you have more of a chance of having something go wrong when taking medications.  Who should not have this procedure? If you are on a blood thinning medication (e.g. Coumadin, Plavix, see list of "Blood Thinners"), or if you have an active infection going on, you should not have the procedure.  If you are taking any blood thinners, please inform your physician.  How should I prepare for this procedure?  Do not eat or drink anything at least six hours prior to the procedure.  Bring a driver with you .  It cannot be a taxi.  Come accompanied by an adult that can drive you back, and that is strong enough to help you if your legs get weak or numb from the  local anesthetic.  Take all of your medicines the morning of the procedure with just enough water to swallow them.  If you have diabetes, make sure that you are scheduled to have your procedure done first thing in the morning, whenever possible.  If you have diabetes, take only half of your insulin dose and notify our nurse that you have done so as soon as you arrive at the clinic.  If you are diabetic, but only take blood sugar pills (oral hypoglycemic), then do not take them on the morning of your procedure.  You may take them after you have had the procedure.  Do not take aspirin or any aspirin-containing medications, at least eleven (11) days prior to the procedure.  They may prolong bleeding.  Wear loose fitting clothing that may be easy to take off and that you would not mind if it got stained with Betadine or blood.  Do not wear any jewelry or perfume  Remove any nail coloring.  It will interfere with some of our monitoring  equipment.  NOTE: Remember that this is not meant to be interpreted as a complete list of all possible complications.  Unforeseen problems may occur.  BLOOD THINNERS The following drugs contain aspirin or other products, which can cause increased bleeding during surgery and should not be taken for 2 weeks prior to and 1 week after surgery.  If you should need take something for relief of minor pain, you may take acetaminophen which is found in Tylenol,m Datril, Anacin-3 and Panadol. It is not blood thinner. The products listed below are.  Do not take any of the products listed below in addition to any listed on your instruction sheet.  A.P.C or A.P.C with Codeine Codeine Phosphate Capsules #3 Ibuprofen Ridaura  ABC compound Congesprin Imuran rimadil  Advil Cope Indocin Robaxisal  Alka-Seltzer Effervescent Pain Reliever and Antacid Coricidin or Coricidin-D  Indomethacin Rufen  Alka-Seltzer plus Cold Medicine Cosprin Ketoprofen S-A-C Tablets  Anacin Analgesic  Tablets or Capsules Coumadin Korlgesic Salflex  Anacin Extra Strength Analgesic tablets or capsules CP-2 Tablets Lanoril Salicylate  Anaprox Cuprimine Capsules Levenox Salocol  Anexsia-D Dalteparin Magan Salsalate  Anodynos Darvon compound Magnesium Salicylate Sine-off  Ansaid Dasin Capsules Magsal Sodium Salicylate  Anturane Depen Capsules Marnal Soma  APF Arthritis pain formula Dewitt's Pills Measurin Stanback  Argesic Dia-Gesic Meclofenamic Sulfinpyrazone  Arthritis Bayer Timed Release Aspirin Diclofenac Meclomen Sulindac  Arthritis pain formula Anacin Dicumarol Medipren Supac  Analgesic (Safety coated) Arthralgen Diffunasal Mefanamic Suprofen  Arthritis Strength Bufferin Dihydrocodeine Mepro Compound Suprol  Arthropan liquid Dopirydamole Methcarbomol with Aspirin Synalgos  ASA tablets/Enseals Disalcid Micrainin Tagament  Ascriptin Doan's Midol Talwin  Ascriptin A/D Dolene Mobidin Tanderil  Ascriptin Extra Strength Dolobid Moblgesic Ticlid  Ascriptin with Codeine Doloprin or Doloprin with Codeine Momentum Tolectin  Asperbuf Duoprin Mono-gesic Trendar  Aspergum Duradyne Motrin or Motrin IB Triminicin  Aspirin plain, buffered or enteric coated Durasal Myochrisine Trigesic  Aspirin Suppositories Easprin Nalfon Trillsate  Aspirin with Codeine Ecotrin Regular or Extra Strength Naprosyn Uracel  Atromid-S Efficin Naproxen Ursinus  Auranofin Capsules Elmiron Neocylate Vanquish  Axotal Emagrin Norgesic Verin  Azathioprine Empirin or Empirin with Codeine Normiflo Vitamin E  Azolid Emprazil Nuprin Voltaren  Bayer Aspirin plain, buffered or children's or timed BC Tablets or powders Encaprin Orgaran Warfarin Sodium  Buff-a-Comp Enoxaparin Orudis Zorpin  Buff-a-Comp with Codeine Equegesic Os-Cal-Gesic   Buffaprin Excedrin plain, buffered or Extra Strength Oxalid   Bufferin Arthritis Strength Feldene Oxphenbutazone   Bufferin plain or Extra Strength Feldene Capsules Oxycodone with Aspirin    Bufferin with Codeine Fenoprofen Fenoprofen Pabalate or Pabalate-SF   Buffets II Flogesic Panagesic   Buffinol plain or Extra Strength Florinal or Florinal with Codeine Panwarfarin   Buf-Tabs Flurbiprofen Penicillamine   Butalbital Compound Four-way cold tablets Penicillin   Butazolidin Fragmin Pepto-Bismol   Carbenicillin Geminisyn Percodan   Carna Arthritis Reliever Geopen Persantine   Carprofen Gold's salt Persistin   Chloramphenicol Goody's Phenylbutazone   Chloromycetin Haltrain Piroxlcam   Clmetidine heparin Plaquenil   Cllnoril Hyco-pap Ponstel   Clofibrate Hydroxy chloroquine Propoxyphen         Before stopping any of these medications, be sure to consult the physician who ordered them.  Some, such as Coumadin (Warfarin) are ordered to prevent or treat serious conditions such as "deep thrombosis", "pumonary embolisms", and other heart problems.  The amount of time that you may need off of the medication may also vary with the medication and the reason for which you were taking it.  If you are taking  any of these medications, please make sure you notify your pain physician before you undergo any procedures.

## 2016-04-20 ENCOUNTER — Telehealth: Payer: Self-pay | Admitting: *Deleted

## 2016-05-08 ENCOUNTER — Other Ambulatory Visit: Payer: Self-pay

## 2016-05-08 DIAGNOSIS — J45909 Unspecified asthma, uncomplicated: Secondary | ICD-10-CM

## 2016-05-08 DIAGNOSIS — F331 Major depressive disorder, recurrent, moderate: Secondary | ICD-10-CM

## 2016-05-08 MED ORDER — ESCITALOPRAM OXALATE 10 MG PO TABS: 10.0000 mg | ORAL_TABLET | Freq: Every day | ORAL | 3 refills | Status: DC

## 2016-05-08 MED ORDER — MONTELUKAST SODIUM 10 MG PO TABS: 10.0000 mg | ORAL_TABLET | Freq: Every day | ORAL | 1 refills | Status: DC

## 2016-05-08 NOTE — Telephone Encounter (Signed)
Pharmacy requesting refill Last ov 01/27/16 Last filled 08/19/15 Please review. Thank you. sd

## 2016-05-08 NOTE — Telephone Encounter (Signed)
Requesting a 90 day supply. Escitalopram 10 MG tablet  QTY:90

## 2016-05-11 ENCOUNTER — Telehealth: Payer: Self-pay

## 2016-05-11 DIAGNOSIS — F3342 Major depressive disorder, recurrent, in full remission: Secondary | ICD-10-CM

## 2016-05-11 HISTORY — PX: REDUCTION MAMMAPLASTY: SUR839

## 2016-05-11 MED ORDER — BUPROPION HCL ER (SR) 150 MG PO TB12: 150.0000 mg | ORAL_TABLET | Freq: Two times a day (BID) | ORAL | 1 refills | Status: DC

## 2016-05-11 NOTE — Telephone Encounter (Signed)
Wellbutrin refilled for 90 day

## 2016-05-11 NOTE — Telephone Encounter (Signed)
Pharmacy is requesting a 90 day supply.  Thanks,  -Suad Autrey

## 2016-05-17 ENCOUNTER — Encounter: Payer: Self-pay | Admitting: Pain Medicine

## 2016-05-17 ENCOUNTER — Ambulatory Visit
Admission: RE | Admit: 2016-05-17 | Discharge: 2016-05-17 | Disposition: A | Discharge status: Home / Self Care | Payer: Medicare Other | Source: Ambulatory Visit | Attending: Pain Medicine | Admitting: Pain Medicine

## 2016-05-17 ENCOUNTER — Ambulatory Visit (HOSPITAL_BASED_OUTPATIENT_CLINIC_OR_DEPARTMENT_OTHER): Payer: Medicare Other | Admitting: Pain Medicine

## 2016-05-17 VITALS — BP 115/95 | HR 54 | Temp 97.7°F | Resp 14 | Ht 64.0 in | Wt 150.0 lb

## 2016-05-17 DIAGNOSIS — M19012 Primary osteoarthritis, left shoulder: Secondary | ICD-10-CM

## 2016-05-17 DIAGNOSIS — Z880 Allergy status to penicillin: Secondary | ICD-10-CM | POA: Insufficient documentation

## 2016-05-17 DIAGNOSIS — M25511 Pain in right shoulder: Secondary | ICD-10-CM

## 2016-05-17 DIAGNOSIS — Z96641 Presence of right artificial hip joint: Secondary | ICD-10-CM | POA: Diagnosis not present

## 2016-05-17 DIAGNOSIS — Z885 Allergy status to narcotic agent status: Secondary | ICD-10-CM | POA: Diagnosis not present

## 2016-05-17 DIAGNOSIS — G8929 Other chronic pain: Secondary | ICD-10-CM

## 2016-05-17 DIAGNOSIS — M19011 Primary osteoarthritis, right shoulder: Secondary | ICD-10-CM | POA: Insufficient documentation

## 2016-05-17 DIAGNOSIS — Z9889 Other specified postprocedural states: Secondary | ICD-10-CM | POA: Diagnosis not present

## 2016-05-17 DIAGNOSIS — Z9049 Acquired absence of other specified parts of digestive tract: Secondary | ICD-10-CM | POA: Insufficient documentation

## 2016-05-17 DIAGNOSIS — M25512 Pain in left shoulder: Secondary | ICD-10-CM

## 2016-05-17 DIAGNOSIS — Z90710 Acquired absence of both cervix and uterus: Secondary | ICD-10-CM | POA: Diagnosis not present

## 2016-05-17 MED ORDER — METHYLPREDNISOLONE ACETATE 80 MG/ML IJ SUSP
80.0000 mg | Freq: Once | INTRAMUSCULAR | Status: DC
  Filled 2016-05-17: qty 1

## 2016-05-17 MED ORDER — MIDAZOLAM HCL 5 MG/5ML IJ SOLN: 1.0000 mg | INTRAMUSCULAR | Status: DC | PRN

## 2016-05-17 MED ORDER — FENTANYL CITRATE (PF) 100 MCG/2ML IJ SOLN
25.0000 ug | INTRAMUSCULAR | Status: DC | PRN
Start: 2016-05-17 — End: 2016-05-17

## 2016-05-17 MED ORDER — ROPIVACAINE HCL 5 MG/ML IJ SOLN
5.0000 mL | Freq: Once | INTRAMUSCULAR | Status: AC
  Administered 2016-05-17: 5 mL via PERINEURAL
  Filled 2016-05-17: qty 20

## 2016-05-17 MED ORDER — LACTATED RINGERS IV SOLN: 1000.0000 mL | Freq: Once | INTRAVENOUS | Status: DC

## 2016-05-17 NOTE — Progress Notes (Signed)
Safety precautions to be maintained throughout the outpatient stay will include: orient to surroundings, keep bed in low position, maintain call bell within reach at all times, provide assistance with transfer out of bed and ambulation.  

## 2016-05-17 NOTE — Patient Instructions (Signed)
Post-procedure Information What to expect: Most procedures involve the use of a local anesthetic (numbing medicine), and a steroid (anti-inflammatory medicine).  The local anesthetics may cause temporary numbness and weakness of the legs or arms, depending on the location of the block. This numbness/weakness may last 4-6 hours, depending on the local anesthetic used. In rare instances, it can last up to 24 hours. While numb, you must be very careful not to injure the extremity.  After any procedure, you could expect the pain to get better within 15-20 minutes. This relief is temporary and may last 4-6 hours. Once the local anesthetics wears off, you could experience discomfort, possibly more than usual, for up to 10 (ten) days. In the case of radiofrequencies, it may last up to 6 weeks. Surgeries may take up to 8 weeks for the healing process. The discomfort is due to the irritation caused by needles going through skin and muscle. To minimize the discomfort, we recommend using ice the first day, and heat from then on. The ice should be applied for 15 minutes on, and 15 minutes off. Keep repeating this cycle until bedtime. Avoid applying the ice directly to the skin, to prevent frostbite. Heat should be used daily, until the pain improves (4-10 days). Be careful not to burn yourself.  Occasionally you may experience muscle spasms or cramps. These occur as a consequence of the irritation caused by the needle sticks to the muscle and the blood that will inevitably be lost into the surrounding muscle tissue. Blood tends to be very irritating to tissues, which tend to react by going into spasm. These spasms may start the same day of your procedure, but they may also take days to develop. This late onset type of spasm or cramp is usually caused by electrolyte imbalances triggered by the steroids, at the level of the kidney. Cramps and spasms tend to respond well to muscle relaxants, multivitamins (some are  triggered by the procedure, but may have their origins in vitamin deficiencies), and "Gatorade", or any sports drinks that can replenish any electrolyte imbalances. (If you are a diabetic, ask your pharmacist to get you a sugar-free brand.) Warm showers or baths may also be helpful. Stretching exercises are highly recommended. General Instructions:  Be alert for signs of possible infection: redness, swelling, heat, red streaks, elevated temperature, and/or fever. These typically appear 4 to 6 days after the procedure. Immediately notify your doctor if you experience unusual bleeding, difficulty breathing, or loss of bowel or bladder control. If you experience increased pain, do not increase your pain medicine intake, unless instructed by your pain physician. Post-Procedure Care:  Be careful in moving about. Muscle spasms in the area of the injection may occur. Applying ice or heat to the area is often helpful. The incidence of spinal headaches after epidural injections ranges between 1.4% and 6%. If you develop a headache that does not seem to respond to conservative therapy, please let your physician know. This can be treated with an epidural blood patch.   Post-procedure numbness or redness is to be expected, however it should average 4 to 6 hours. If numbness and weakness of your extremities begins to develop 4 to 6 hours after your procedure, and is felt to be progressing and worsening, immediately contact your physician.   Diet:  If you experience nausea, do not eat until this sensation goes away. If you had a "Stellate Ganglion Block" for upper extremity "Reflex Sympathetic Dystrophy", do not eat or drink until your   hoarseness goes away. In any case, always start with liquids first and if you tolerate them well, then slowly progress to more solid foods. Activity:  For the first 4 to 6 hours after the procedure, use caution in moving about as you may experience numbness and/or weakness. Use caution in  cooking, using household electrical appliances, and climbing steps. If you need to reach your Doctor call our office: [phone] Monday-Thursday 8:00 am - 4:00 PM    Fridays: Closed     In case of an emergency: In case of emergency, call 911 or go to the nearest emergency room and have the physician there call us.  Interpretation of Procedure Every nerve block has two components: a diagnostic component, and a treatment component. Unrealistic expectations are the most common causes of "perceived failure".  In a perfect world, a single nerve block should be able to completely and permanently eliminate the pain. Sadly, the world is not perfect.  Most pain management nerve blocks are performed using local anesthetics and steroids. Steroids are responsible for any long-term benefit that you may experience. Their purpose is to decrease any chronic swelling that may exist in the area. Steroids begin to work immediately after being injected. However, most patients will not experience any benefits until 5 to 10 days after the injection, when the swelling has come down to the point where they can tell a difference. Steroids will only help if there is swelling to be treated. As such, they can assist with the diagnosis. If effective, they suggest an inflammatory component to the pain, and if ineffective, they rule out inflammation as the main cause or component of the problem. If the problem is one of mechanical compression, you will get no benefit from those steroids.   In the case of local anesthetics, they have a crucial role in the diagnosis of your condition. Most will begin to work within15 to 20 minutes after injection. The duration will depend on the type used (short- vs. Long-acting). It is of outmost importance that patients keep tract of their pain, after the procedure. To assist with this matter, a "Post-procedure Pain Diary" is provided. Make sure to complete it and to bring it back to your  follow-up appointment.  As long as the patient keeps accurate, detailed records of their symptoms after every procedure, and returns to have those interpreted, every procedure will provide us with invaluable information. Even a block that does not provide the patient with any relief, will always provide us with information about the mechanism and the origin of the pain. The only time a nerve block can be considered a waste of time is when patients do not keep track of the results, or do not keep their post-procedure appointment.  Reporting the results back to your physician The Pain Score  Pain is a subjective complaint. It cannot be seen, touched, or measured. We depend entirely on the patient's report of the pain in order to assess your condition and treatment. To evaluate the pain, we use a pain scale, where "0" means "No Pain", and a "10" is "the worst possible pain that you can even imagine" (i.e. something like been eaten alive by a shark or being torn apart by a lion).   You will frequently be asked to rate your pain. Please be as accurate, remember that medical decisions will be based on your responses. Please do not rate your pain above a 10. Doing so is actually interpreted as "symptom magnification" (exaggeration), as   well as lack of understanding with regards to the scale. To put this into perspective, when you tell us that your pain is at a 10 (ten), what you are saying is that there is nothing we can do to make this pain any worse. (Carefully think about that.) Post-Procedure Pain Diary   Name: Date of Service Procedure      Time Period Pain Score Painful Area  Pre-procedure ____/10   Time Period Pain Score Area improved. Area not improved.  15 to 30 min post-procedure ____/10    1st hour after procedure ____/10    2nd hour after procedure ____/10    3rd hour after procedure ____/10    4th hour after procedure ____/10    5th hour after procedure ____/10    6th hour after procedure  ____/10    7th hour after procedure ____/10    Time Period Pain Score Area improved. Area not improved.  Note: From here on, always document your pain score 1st thing in the morning.  1st day after procedure ____/10    2nd day after procedure ____/10    3rd day after procedure ____/10    4th day after procedure ____/10    5th day after procedure ____/10    Time Period Pain Score Area improved. Area not improved.  10th day after procedure ____/10    20th day after procedure ____/10     Benefits Indicate for each set of activities if the procedure changes your ability to accomplish them.  Activity Worse No-Change Improved  Dressing, eating, walking, toileting, hygiene     Shopping, housekeeping, food preparation, community transportation     Range of motion of affected area      Your opinion Please indicate which statement best describes your impression of this treatment.  Statement (X)  Based on the results, I am encouraged.   Based on the results, I am disappointed.   I am not sure I have an opinion at this point.    Note: Make sure to complete and return this form to your physician, on your follow-up appointment. This information will be used to interpret the results. Failure to accurately complete, or to return this information, may result in less than optimal outcomes.  

## 2016-05-17 NOTE — Progress Notes (Signed)
Patient's Name: [name]  MRN: [id number]  Referring Provider: Jerrol Banana.,*  DOB: 12-03-1960  PCP: Jerrol Banana., MD  DOS: 05/17/2016  Note by: Kathlen Brunswick. Dossie Arbour, MD  Service setting: Ambulatory outpatient  Location: [facility] (AMB) Pain Management Facility  Visit type: Procedure  Specialty: Interventional Pain Management  Patient type: Established   Primary Reason for Visit: Interventional Pain Management Treatment. CC: Shoulder Pain (scapula, left is worse)  Procedure:  Anesthesia, Analgesia, Anxiolysis:  Type: Diagnostic Suprascapular nerve Block (NO STEROIDS) Region: Posterior Shoulder & Scapular Areas Level: Superior to the scapular spine, in the lateral aspect of the supraspinatus fossa (Suprescapular notch). Laterality: Bilateral  Type: Local Anesthesia Local Anesthetic: Lidocaine 1% Route: Infiltration ([place]/IM) IV Access: Declined Sedation: Declined  Indication(s): Analgesia          Indications: 1. 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 Chronic left shoulder pain   3. Chronic shoulder pain (Location of Secondary source of pain) (Bilateral) (L>R)    Pain Score: Pre-procedure: 4 /10 Post-procedure: 0-No pain/10  Pre-op Assessment:  Previous date of service: 04/13/16 Service provided: Med Refill [name] is a 56 y.o. (year old), female patient, seen today for interventional treatment. She  has a past surgical history that includes RIGHT HIP ARTHROSCOPY FOR LABRAL TEAR (ABOUT 2010); Anterior fusion cervical spine (MAY 2012); Nasal septum surgery (MARCH 2013); Back surgery (2009); Tonsillectomy; Abdominal hysterectomy; DIAGNOSTIC LAPAROSCOPIES - MULTIPLE FOR ENDOMETRIOSIS; Cholecystectomy; Hip arthroscopy (09/20/2011); Shoulder arthroscopy (05-06-12); Carpal tunnel release (05-06-12); Total hip arthroplasty (Right, 05/13/2012); Anterior cervical decomp/discectomy fusion (N/A, 10/30/2012); Posterior cervical fusion/foraminotomy (N/A, 04/16/2013); Breast  biopsy (Right, 2008); and Breast excisional biopsy (Left, 1998). Her primarily concern today is the Shoulder Pain (scapula, left is worse)  Initial Vital Signs: Blood pressure (!) 147/86, pulse (!) 54, temperature 97.7 F (36.5 C), resp. rate 16, height 5\' 4"  (1.626 m), weight 150 lb (68 kg), SpO2 100 %. BMI: 25.75 kg/m  Risk Assessment: Allergies: Reviewed. She is allergic to amoxicillin; clindamycin; codeine; sulfa antibiotics; shellfish allergy; decadron [dexamethasone]; papaya derivatives; prednisone; betadine [povidone iodine]; chlorhexidine; clarithromycin; clindamycin/lincomycin; and other.  Allergy Precautions: No steroids or contrast Coagulopathies: "Reviewed. None identified.  Blood-thinner therapy: None at this time Active Infection(s): Reviewed. None identified. [name] is afebrile  Site Confirmation: [name] was asked to confirm the procedure and laterality before marking the site Procedure checklist: Completed Consent: Before the procedure and under the influence of no sedative(s), amnesic(s), or anxiolytics, the patient was informed of the treatment options, risks and possible complications. To fulfill our ethical and legal obligations, as recommended by the American Medical Association's Code of Ethics, I have informed the patient of my clinical impression; the nature and purpose of the treatment or procedure; the risks, benefits, and possible complications of the intervention; the alternatives, including doing nothing; the risk(s) and benefit(s) of the alternative treatment(s) or procedure(s); and the risk(s) and benefit(s) of doing nothing. The patient was provided information about the general risks and possible complications associated with the procedure. These may include, but are not limited to: failure to achieve desired goals, infection, bleeding, organ or nerve damage, allergic reactions, paralysis, and death. In addition, the patient was informed of those risks and  complications associated to the procedure, such as failure to decrease pain; infection; bleeding; organ or nerve damage with subsequent damage to sensory, motor, and/or autonomic systems, resulting in permanent pain, numbness, and/or weakness of one or several areas of the body; allergic reactions; (i.e.: anaphylactic  reaction); and/or death. Furthermore, the patient was informed of those risks and complications associated with the medications. These include, but are not limited to: allergic reactions (i.e.: anaphylactic or anaphylactoid reaction(s)); adrenal axis suppression; blood sugar elevation that in diabetics may result in ketoacidosis or comma; water retention that in patients with history of congestive heart failure may result in shortness of breath, pulmonary edema, and decompensation with resultant heart failure; weight gain; swelling or edema; medication-induced neural toxicity; particulate matter embolism and blood vessel occlusion with resultant organ, and/or nervous system infarction; and/or aseptic necrosis of one or more joints. Finally, the patient was informed that Medicine is not an exact science; therefore, there is also the possibility of unforeseen or unpredictable risks and/or possible complications that may result in a catastrophic outcome. The patient indicated having understood very clearly. We have given the patient no guarantees and we have made no promises. Enough time was given to the patient to ask questions, all of which were answered to the patient's satisfaction. [name] has indicated that she wanted to continue with the procedure. Attestation: I, the ordering provider, attest that I have discussed with the patient the benefits, risks, side-effects, alternatives, likelihood of achieving goals, and potential problems during recovery for the procedure that I have provided informed consent. Date: 05/17/2016; Time: 9:06 AM  Pre-Procedure Preparation:  Monitoring: As per clinic  protocol. Respiration, ETCO2, SpO2, BP, heart rate and rhythm monitor placed and checked for adequate function Safety Precautions: Patient was assessed for positional comfort and pressure points before starting the procedure. Time-out: I initiated and conducted the "Time-out" before starting the procedure, as per protocol. The patient was asked to participate by confirming the accuracy of the "Time Out" information. Verification of the correct person, site, and procedure were performed and confirmed by me, the nursing staff, and the patient. "Time-out" conducted as per Joint Commission's Universal Protocol (UP.01.01.01). "Time-out" Date & Time: 05/17/2016; 0938 hrs.  Description of Procedure Process:   Position: Prone", Target Area: Suprascapular notch. Approach: Posterior approach. Area Prepped: Entire shoulder Area Prepping solution: 70% Alcohol prep Safety Precautions: Aspiration looking for blood return was conducted prior to all injections. At no point did we inject any substances, as a needle was being advanced. No attempts were made at seeking any paresthesias. Safe injection practices and needle disposal techniques used. Medications properly checked for expiration dates. SDV (single dose vial) medications used. Description of the Procedure: Protocol guidelines were followed. The patient was placed in position over the procedure table. The target area was identified and the area prepped in the usual manner. Skin & deeper tissues infiltrated with local anesthetic. Appropriate amount of time allowed to pass for local anesthetics to take effect. The procedure needles were then advanced to the target area. Proper needle placement secured. Negative aspiration confirmed. Solution injected in intermittent fashion, asking for systemic symptoms every 0.5cc of injectate. The needles were then removed and the area cleansed, making sure to leave some of the prepping solution back to take advantage of its long  term bactericidal properties. Vitals:   05/17/16 0937 05/17/16 0941 05/17/16 0945 05/17/16 0948  BP: 124/72 (!) 122/95 (!) 124/95 (!) 115/95  Pulse:      Resp: 10 11 13 14   Temp:      SpO2: 100% 100% 100% 100%  Weight:      Height:        Start Time: 0938 hrs. End Time: 0947 hrs. Materials:  Needle(s) Type: Regular needle Gauge: 22G Length: 3.5-in  Medication(s): We administered ropivacaine (PF) 5 mg/mL (0.5%) and ropivacaine (PF) 5 mg/mL (0.5%). Please see chart orders for dosing details.  Imaging Guidance (Non-Spinal):  Type of Imaging Technique: Fluoroscopy Guidance (Non-Spinal) Indication(s): Assistance in needle guidance and placement for procedures requiring needle placement in or near specific anatomical locations not easily accessible without such assistance. Exposure Time: Please see nurses notes. Contrast: Before injecting any contrast, we confirmed that the patient did not have an allergy to iodine, shellfish, or radiological contrast. Once satisfactory needle placement was completed at the desired level, radiological contrast was injected. Contrast injected under live fluoroscopy. No contrast complications. See chart for type and volume of contrast used. Fluoroscopic Guidance: I was personally present during the use of fluoroscopy. "Tunnel Vision Technique" used to obtain the best possible view of the target area. Parallax error corrected before commencing the procedure. "Direction-depth-direction" technique used to introduce the needle under continuous pulsed fluoroscopy. Once target was reached, antero-posterior, oblique, and lateral fluoroscopic projection used confirm needle placement in all planes. Images permanently stored in EMR. Interpretation: I personally interpreted the imaging intraoperatively. Adequate needle placement confirmed in multiple planes. Appropriate spread of contrast into desired area was observed. No evidence of afferent or efferent intravascular uptake.  Permanent images saved into the patient's record.  Antibiotic Prophylaxis:  Indication(s): None identified Antibiotic given: None  Post-operative Assessment:  EBL: None Complications: No immediate post-treatment complications observed by team, or reported by patient. Note: The patient tolerated the entire procedure well. A repeat set of vitals were taken after the procedure and the patient was kept under observation following institutional policy, for this type of procedure. Post-procedural neurological assessment was performed, showing return to baseline, prior to discharge. The patient was provided with post-procedure discharge instructions, including a section on how to identify potential problems. Should any problems arise concerning this procedure, the patient was given instructions to immediately contact us, at any time, without hesitation. In any case, we plan to contact the patient by telephone for a follow-up status report regarding this interventional procedure. Comments:  No additional relevant information.  Plan of Care  Disposition: Discharge home  Discharge Date & Time: 05/17/2016; 0948 hrs.  Physician-requested Follow-up:  Return in about 2 weeks (around 05/31/2016) for Post-Procedure evaluation.  Future Appointments Date Time Provider [place]  06/14/2016 1:30 PM Milinda Pointer, MD [facility] None   Medications ordered for procedure: Meds ordered this encounter  Medications  . ropivacaine (PF) 5 mg/mL (0.5%) (NAROPIN) injection 5 mL  . ropivacaine (PF) 5 mg/mL (0.5%) (NAROPIN) injection 5 mL   Medications administered: We administered ropivacaine (PF) 5 mg/mL (0.5%) and ropivacaine (PF) 5 mg/mL (0.5%).  See the medical record for exact dosing, route, and time of administration.  Lab-work, Procedure(s), & Referral(s) Ordered: Orders Placed This Encounter  Procedures  . SUPRASCAPULAR NERVE BLOCK  . DG C-Arm 1-60 Min-No Report  . Discharge instructions  .  Follow-up  . Informed Consent Details: Transcribe to consent form and obtain patient signature  . Provider attestation of informed consent for procedure/surgical case  . Verify informed consent   Imaging Ordered: Results for orders placed during the hospital encounter of 05/13/12  DG C-Arm 1-60 Min-No Report   Narrative CLINICAL DATA: right total hip   C-ARM 1-60 MINUTES  Fluoroscopy was utilized by the requesting physician.  No radiographic  interpretation.     New Prescriptions   No medications on file   Primary Care Physician: Jerrol Banana., MD Location: [facility]  Facility Note by: Kathlen Brunswick. Dossie Arbour, M.D, DABA, DABAPM, DABPM, DABIPP, FIPP Date: 05/17/2016; Time: 11:24 AM  Disclaimer:  Medicine is not an Chief Strategy Officer. The only guarantee in medicine is that nothing is guaranteed. It is important to note that the decision to proceed with this intervention was based on the information collected from the patient. The Data and conclusions were drawn from the patient's questionnaire, the interview, and the physical examination. Because the information was provided in large part by the patient, it cannot be guaranteed that it has not been purposely or unconsciously manipulated. Every effort has been made to obtain as much relevant data as possible for this evaluation. It is important to note that the conclusions that lead to this procedure are derived in large part from the available data. Always take into account that the treatment will also be dependent on availability of resources and existing treatment guidelines, considered by other Pain Management Practitioners as being common knowledge and practice, at the time of the intervention. For Medico-Legal purposes, it is also important to point out that variation in procedural techniques and pharmacological choices are the acceptable norm. The indications, contraindications, technique, and results of the above  procedure should only be interpreted and judged by a Board-Certified Interventional Pain Specialist with extensive familiarity and expertise in the same exact procedure and technique. Attempts at providing opinions without similar or greater experience and expertise than that of the treating physician will be considered as inappropriate and unethical, and shall result in a formal complaint to the state medical board and applicable specialty societies.  Instructions provided at this appointment: Patient Instructions   Post-procedure Information What to expect: Most procedures involve the use of a local anesthetic (numbing medicine), and a steroid (anti-inflammatory medicine).  The local anesthetics may cause temporary numbness and weakness of the legs or arms, depending on the location of the block. This numbness/weakness may last 4-6 hours, depending on the local anesthetic used. In rare instances, it can last up to 24 hours. While numb, you must be very careful not to injure the extremity.  After any procedure, you could expect the pain to get better within 15-20 minutes. This relief is temporary and may last 4-6 hours. Once the local anesthetics wears off, you could experience discomfort, possibly more than usual, for up to 10 (ten) days. In the case of radiofrequencies, it may last up to 6 weeks. Surgeries may take up to 8 weeks for the healing process. The discomfort is due to the irritation caused by needles going through skin and muscle. To minimize the discomfort, we recommend using ice the first day, and heat from then on. The ice should be applied for 15 minutes on, and 15 minutes off. Keep repeating this cycle until bedtime. Avoid applying the ice directly to the skin, to prevent frostbite. Heat should be used daily, until the pain improves (4-10 days). Be careful not to burn yourself.  Occasionally you may experience muscle spasms or cramps. These occur as a consequence of the irritation  caused by the needle sticks to the muscle and the blood that will inevitably be lost into the surrounding muscle tissue. Blood tends to be very irritating to tissues, which tend to react by going into spasm. These spasms may start the same day of your procedure, but they may also take days to develop. This late onset type of spasm or cramp is usually caused by electrolyte imbalances triggered by the steroids, at the level of the kidney. Cramps  and spasms tend to respond well to muscle relaxants, multivitamins (some are triggered by the procedure, but may have their origins in vitamin deficiencies), and "Gatorade", or any sports drinks that can replenish any electrolyte imbalances. (If you are a diabetic, ask your pharmacist to get you a sugar-free brand.) Warm showers or baths may also be helpful. Stretching exercises are highly recommended. General Instructions:  Be alert for signs of possible infection: redness, swelling, heat, red streaks, elevated temperature, and/or fever. These typically appear 4 to 6 days after the procedure. Immediately notify your doctor if you experience unusual bleeding, difficulty breathing, or loss of bowel or bladder control. If you experience increased pain, do not increase your pain medicine intake, unless instructed by your pain physician. Post-Procedure Care:  Be careful in moving about. Muscle spasms in the area of the injection may occur. Applying ice or heat to the area is often helpful. The incidence of spinal headaches after epidural injections ranges between 1.4% and 6%. If you develop a headache that does not seem to respond to conservative therapy, please let your physician know. This can be treated with an epidural blood patch.   Post-procedure numbness or redness is to be expected, however it should average 4 to 6 hours. If numbness and weakness of your extremities begins to develop 4 to 6 hours after your procedure, and is felt to be progressing and worsening,  immediately contact your physician.   Diet:  If you experience nausea, do not eat until this sensation goes away. If you had a "Stellate Ganglion Block" for upper extremity "Reflex Sympathetic Dystrophy", do not eat or drink until your hoarseness goes away. In any case, always start with liquids first and if you tolerate them well, then slowly progress to more solid foods. Activity:  For the first 4 to 6 hours after the procedure, use caution in moving about as you may experience numbness and/or weakness. Use caution in cooking, using household electrical appliances, and climbing steps. If you need to reach your Doctor call our office: [phone]) [phone] Monday-Thursday 8:00 am - 4:00 PM    Fridays: Closed     In case of an emergency: In case of emergency, call 911 or go to the nearest emergency room and have the physician there call us.  Interpretation of Procedure Every nerve block has two components: a diagnostic component, and a treatment component. Unrealistic expectations are the most common causes of "perceived failure".  In a perfect world, a single nerve block should be able to completely and permanently eliminate the pain. Sadly, the world is not perfect.  Most pain management nerve blocks are performed using local anesthetics and steroids. Steroids are responsible for any long-term benefit that you may experience. Their purpose is to decrease any chronic swelling that may exist in the area. Steroids begin to work immediately after being injected. However, most patients will not experience any benefits until 5 to 10 days after the injection, when the swelling has come down to the point where they can tell a difference. Steroids will only help if there is swelling to be treated. As such, they can assist with the diagnosis. If effective, they suggest an inflammatory component to the pain, and if ineffective, they rule out inflammation as the main cause or component of the problem. If the problem is  one of mechanical compression, you will get no benefit from those steroids.   In the case of local anesthetics, they have a crucial role in the diagnosis of  your condition. Most will begin to work within15 to 20 minutes after injection. The duration will depend on the type used (short- vs. Long-acting). It is of outmost importance that patients keep tract of their pain, after the procedure. To assist with this matter, a "Post-procedure Pain Diary" is provided. Make sure to complete it and to bring it back to your follow-up appointment.  As long as the patient keeps accurate, detailed records of their symptoms after every procedure, and returns to have those interpreted, every procedure will provide [place] with invaluable information. Even a block that does not provide the patient with any relief, will always provide [place] with information about the mechanism and the origin of the pain. The only time a nerve block can be considered a waste of time is when patients do not keep track of the results, or do not keep their post-procedure appointment.  Reporting the results back to your physician The Pain Score  Pain is a subjective complaint. It cannot be seen, touched, or measured. We depend entirely on the patient's report of the pain in order to assess your condition and treatment. To evaluate the pain, we use a pain scale, where "0" means "No Pain", and a "10" is "the worst possible pain that you can even imagine" (i.e. something like been eaten alive by a shark or being torn apart by a lion).   You will frequently be asked to rate your pain. Please be as accurate, remember that medical decisions will be based on your responses. Please do not rate your pain above a 10. Doing so is actually interpreted as "symptom magnification" (exaggeration), as well as lack of understanding with regards to the scale. To put this into perspective, when you tell us that your pain is at a 10 (ten), what you are saying is that there  is nothing we can do to make this pain any worse. (Carefully think about that.) Post-Procedure Pain Diary   Name: Date of Service Procedure      Time Period Pain Score Painful Area  Pre-procedure ____/10   Time Period Pain Score Area improved. Area not improved.  15 to 30 min post-procedure ____/10    1st hour after procedure ____/10    2nd hour after procedure ____/10    3rd hour after procedure ____/10    4th hour after procedure ____/10    5th hour after procedure ____/10    6th hour after procedure ____/10    7th hour after procedure ____/10    Time Period Pain Score Area improved. Area not improved.  Note: From here on, always document your pain score 1st thing in the morning.  1st day after procedure ____/10    2nd day after procedure ____/10    3rd day after procedure ____/10    4th day after procedure ____/10    5th day after procedure ____/10    Time Period Pain Score Area improved. Area not improved.  10th day after procedure ____/10    20th day after procedure ____/10     Benefits Indicate for each set of activities if the procedure changes your ability to accomplish them.  Activity Worse No-Change Improved  Dressing, eating, walking, toileting, hygiene     Shopping, housekeeping, food preparation, community transportation     Range of motion of affected area      Your opinion Please indicate which statement best describes your impression of this treatment.  Statement (X)  Based on the results, I am encouraged.  Based on the results, I am disappointed.   I am not sure I have an opinion at this point.    Note: Make sure to complete and return this form to your physician, on your follow-up appointment. This information will be used to interpret the results. Failure to accurately complete, or to return this information, may result in less than optimal outcomes.

## 2016-05-18 ENCOUNTER — Telehealth: Payer: Self-pay | Admitting: *Deleted

## 2016-05-18 NOTE — Telephone Encounter (Signed)
Denies problems post procedure. 

## 2016-05-26 ENCOUNTER — Ambulatory Visit (INDEPENDENT_AMBULATORY_CARE_PROVIDER_SITE_OTHER): Payer: Medicare Other | Admitting: Family Medicine

## 2016-05-26 ENCOUNTER — Encounter: Payer: Self-pay | Admitting: Family Medicine

## 2016-05-26 VITALS — BP 120/82 | HR 60 | Temp 98.4°F | Resp 16 | Wt 153.0 lb

## 2016-05-26 DIAGNOSIS — J301 Allergic rhinitis due to pollen: Secondary | ICD-10-CM | POA: Diagnosis not present

## 2016-05-26 DIAGNOSIS — J029 Acute pharyngitis, unspecified: Secondary | ICD-10-CM | POA: Diagnosis not present

## 2016-05-26 DIAGNOSIS — G894 Chronic pain syndrome: Secondary | ICD-10-CM

## 2016-05-26 LAB — POCT RAPID STREP A (OFFICE): Rapid Strep A Screen: NEGATIVE

## 2016-05-26 MED ORDER — FLUTICASONE PROPIONATE 50 MCG/ACT NA SUSP: 2.0000 | Freq: Every day | NASAL | 3 refills | Status: DC

## 2016-05-26 MED ORDER — AZITHROMYCIN 250 MG PO TABS: ORAL_TABLET | ORAL | 0 refills | Status: DC

## 2016-05-26 NOTE — Patient Instructions (Signed)
Sore Throat A sore throat is pain, burning, irritation, or scratchiness in the throat. When you have a sore throat, you may feel pain or tenderness in your throat when you swallow or talk. Many things can cause a sore throat, including:  An infection.  Seasonal allergies.  Dryness in the air.  Irritants, such as smoke or pollution.  Gastroesophageal reflux disease (GERD).  A tumor. A sore throat is often the first sign of another sickness. It may happen with other symptoms, such as coughing, sneezing, fever, and swollen neck glands. Most sore throats go away without medical treatment. Follow these instructions at home:  Take over-the-counter medicines only as told by your health care provider.  Drink enough fluids to keep your urine clear or pale yellow.  Rest as needed.  To help with pain, try:  Sipping warm liquids, such as broth, herbal tea, or warm water.  Eating or drinking cold or frozen liquids, such as frozen ice pops.  Gargling with a salt-water mixture 3-4 times a day or as needed. To make a salt-water mixture, completely dissolve -1 tsp of salt in 1 cup of warm water.  Sucking on hard candy or throat lozenges.  Putting a cool-mist humidifier in your bedroom at night to moisten the air.  Sitting in the bathroom with the door closed for 5-10 minutes while you run hot water in the shower.  Do not use any tobacco products, such as cigarettes, chewing tobacco, and e-cigarettes. If you need help quitting, ask your health care provider. Contact a health care provider if:  You have a fever for more than 2-3 days.  You have symptoms that last (are persistent) for more than 2-3 days.  Your throat does not get better within 7 days.  You have a fever and your symptoms suddenly get worse. Get help right away if:  You have difficulty breathing.  You cannot swallow fluids, soft foods, or your saliva.  You have increased swelling in your throat or neck.  You have  persistent nausea and vomiting. This information is not intended to replace advice given to you by your health care provider. Make sure you discuss any questions you have with your health care provider. Document Released: 04/06/2004 Document Revised: 10/24/2015 Document Reviewed: 12/18/2014 Elsevier Interactive Patient Education  2017 [employer] Inc[employer]  

## 2016-05-26 NOTE — Progress Notes (Signed)
Patient: [name] Female    DOB: 1960/09/30   56 y.o.   MRN: [id number] Visit Date: 05/26/2016  Today's Provider: Vernie Murders, PA   Chief Complaint  Patient presents with  . Sore Throat   Subjective:    Sore Throat   This is a new problem. The current episode started in the past 7 days (x 3 days). The problem has been unchanged. Neither side of throat is experiencing more pain than the other. There has been no fever. The pain is at a severity of 3/10. The pain is mild. Associated symptoms include headaches, a hoarse voice, neck pain and swollen glands. Pertinent negatives include no abdominal pain, congestion, coughing, diarrhea, ear discharge, ear pain, plugged ear sensation, shortness of breath or vomiting. She has had exposure to strep. She has tried nothing for the symptoms.   Pt also needs refills on Flonase.  Patient Active Problem List   Diagnosis Date Noted  . 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 Cervicogenic headache 07/06/2015  . Chronic hip pain (Location of Primary Source of Pain) (Bilateral) (L>R) 07/05/2015  . 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 History of total hip replacement (THR) (Right) 07/05/2015  . Chronic shoulder radicular pain (Bilateral) (L>R) 07/05/2015  . Lumbar facet syndrome (Location of Tertiary source of pain) (Bilateral) (L>R) 07/05/2015  . Chronic low back pain (Location of Tertiary source of pain) (Bilateral) (L>R) 04/05/2015  . Chronic left shoulder pain 01/04/2015  . Chronic right hip pain 01/04/2015  . Encounter for therapeutic drug level monitoring 01/04/2015  . Long term current use of opiate analgesic 01/04/2015  . Long term prescription opiate use 01/04/2015  . Opiate use (60 MME/Day) 01/04/2015  . Chronic pain syndrome 01/04/2015  . Steroid intolerance 01/04/2015  . Cervical spine pain 01/04/2015  . Cervical spondylosis  01/04/2015  . Cervical facet arthropathy (bilateral) 01/04/2015  . History of cervical spinal surgery 01/04/2015  . Chronic shoulder pain (Location of Secondary source of pain) (Bilateral) (L>R) 01/04/2015  . 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 Lumbar spondylosis 01/04/2015  . Cervical facet syndrome (bilateral) 01/04/2015  . Chronic right sacroiliac joint pain 01/04/2015  . DOE (dyspnea on exertion) 09/03/2014  . Asthma, mild 08/18/2014  . Blurred vision 08/18/2014  . Benign paroxysmal positional nystagmus 08/18/2014  . Clinical depression 08/18/2014  . Fatigue 08/18/2014  . Cannot sleep 08/18/2014  . Awareness of heartbeats 08/18/2014  . RAD (reactive airway disease) 08/18/2014  . Apnea, sleep 08/18/2014  . Chronic neck pain 04/16/2013  . Anxiety state 08/03/2003  . Colon, diverticulosis 07/15/2003  . IBS (irritable bowel syndrome) 04/27/2003  . Esophagitis, reflux 02/11/2003  . 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 Abnormal EKG    HX OF INVERTED T WAVES ON EKG, PALPITATIONS, CHEST PAINS-CARDIAC WORK UP DID NOT SHOW ANY HEART DISEASE  . 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 Anemia    Iron Infusion-8 yrs ago  . Anxiety   . Asthma    INHALERS WITH FLARE -UPS  . Cephalalgia 08/18/2014  . Cervical disc disease 08/18/2014   Needs neck surgery.   . Chronic headaches   . DDD (degenerative disc disease)    CERVICAL AND LUMBAR-CHRONIC PAIN, RT HIP LABRAL TEAR  . Depression    PT STATES A LOT OF STRESS  IN HER LIFE  . Dizziness 04/22/2013  . Duodenal ulcer with hemorrhage and perforation ([place]) 04/27/2003  . GERD (gastroesophageal reflux disease)   . H/O arthrodesis 08/18/2014  . Headache(784.0)    AND NECK PAIN--STATES RECENT TEST SHOW CERVICAL DEGENERATION  . History of blood transfusion    s/p back surgery  . History of cervical spinal surgery 01/04/2015  . History of  kidney stones   . Hypertension   . Inverted T wave   . Narrowing of intervertebral disc space 08/18/2014   Currently on disability.   . Orthostatic hypotension 04/22/2013  . Pain    CHRONIC NECK AND BACK PAIN - LIMITED ROM NECK - S/P FUSIONS CERVICAL AND LUMBAR  . Pneumonia   . PONV (postoperative nausea and vomiting)    PT GIVES HX OF N&V AND FEVER WITH SURGERIES YEARS AGO--BUT NO PROBLEMS WITH MORE RECENT SURGERIES--STATES NOT MALIGNANT HYPERTHERMIA  . Postop Hyponatremia 05/14/2012  . Postoperative anemia due to acute blood loss 05/14/2012  . Right foot drop   . 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    Past Surgical History:  Procedure Laterality Date  . ABDOMINAL HYSTERECTOMY    . ANTERIOR CERVICAL DECOMP/DISCECTOMY FUSION N/A 10/30/2012   Procedure: ACDF C5-6, EXPLORATION AND HARDWARE REMOVAL C6-7;  Surgeon: Melina Schools, MD;  Location: [facility];  Service: Orthopedics;  Laterality: N/A;  . ANTERIOR FUSION CERVICAL SPINE  MAY 2012   AT [facility]  . BACK SURGERY  2009   LUMBAR FUSION WITH RODS   . BREAST BIOPSY Right 2008   benign.- bx/clip  . BREAST EXCISIONAL BIOPSY Left 1998   benign  . CARPAL TUNNEL RELEASE  05-06-12   Right  . CHOLECYSTECTOMY    . DIAGNOSTIC LAPAROSCOPIES - MULTIPLE FOR ENDOMETRIOSIS    . HIP ARTHROSCOPY  09/20/2011   Procedure: ARTHROSCOPY HIP;  Surgeon: Gearlean Alf, MD;  Location: [facility];  Service: Orthopedics;  Laterality: Right;  Right Hip Scope with Labral Debridement  . NASAL SEPTUM SURGERY  MARCH 2013   IN [place]  . POSTERIOR CERVICAL FUSION/FORAMINOTOMY N/A 04/16/2013   Procedure: REMOVAL CERVICAL PLATES AND INTERBODY CAGE/POSTERIOR CERVICAL SPINAL FUSION C4 - C6/C5 CORPECTOMY/C4 - C6 FUSION WITH ILIAC CREST BONE GRAFT;  Surgeon: Melina Schools, MD;  Location: [facility];  Service: Orthopedics;  Laterality: N/A;  . RIGHT HIP ARTHROSCOPY FOR LABRAL TEAR  ABOUT 2010   2012 also  . SHOULDER ARTHROSCOPY  05-06-12   bone  spur  . TONSILLECTOMY     AS A CHILD  . TOTAL HIP ARTHROPLASTY Right 05/13/2012   Procedure: TOTAL HIP ARTHROPLASTY ANTERIOR APPROACH;  Surgeon: Gearlean Alf, MD;  Location: [facility];  Service: Orthopedics;  Laterality: Right;   Family History  Problem Relation Age of Onset  . Aneurysm Mother   . Aneurysm Maternal Grandmother   . Breast cancer Paternal Grandmother 8    Allergies  Allergen Reactions  . Amoxicillin Hives  . Clindamycin Hives  . Codeine Hives  . Sulfa Antibiotics Nausea And Vomiting and Hives       . Shellfish Allergy Hives  . Decadron [Dexamethasone] Other (See Comments)    Hot flashes, insomnia, "manic"  . Papaya Derivatives Hives  . Prednisone     High blood pressure, flushed, mood changes, heart palpitations  . Betadine [Povidone Iodine] Hives       . Chlorhexidine Hives       . Clarithromycin Hives  . Clindamycin/Lincomycin Hives  .  Other Hives     Mango      Current Outpatient Prescriptions:  .  albuterol (PROVENTIL HFA;VENTOLIN HFA) 108 (90 BASE) MCG/ACT inhaler, Inhale 2 puffs into the lungs every 6 (six) hours as needed for wheezing., Disp: , Rfl:  .  ALPRAZolam (XANAX) 1 MG tablet, TAKE 1/2 TAB BY MOUTH EVERY MORNING, 1/2 TAB AT LUNCH, AND 2 TABS AT BEDTIME (Patient taking differently: 2 TABS AT BEDTIME), Disp: 90 tablet, Rfl: 5 .  buPROPion (WELLBUTRIN SR) 150 MG 12 hr tablet, Take 1 tablet (150 mg total) by mouth 2 (two) times daily., Disp: 180 tablet, Rfl: 1 .  escitalopram (LEXAPRO) 10 MG tablet, Take 1 tablet (10 mg total) by mouth at bedtime. Take 1/2 tab PO q h.s. X 1 week then increase to 1 tab PO q h.s., Disp: 90 tablet, Rfl: 3 .  estradiol (ESTRACE) 0.1 MG/GM vaginal cream, Place 1 Applicatorful vaginally 3 (three) times a week., Disp: 42.5 g, Rfl: 5 .  fluticasone (FLONASE) 50 MCG/ACT nasal spray, Place 2 sprays into the nose daily., Disp: , Rfl:  .  Fluticasone-Salmeterol (ADVAIR DISKUS) 500-50 MCG/DOSE AEPB, Inhale 1 puff into the  lungs 2 (two) times daily., Disp: 60 each, Rfl: 5 .  LINZESS 290 MCG CAPS capsule, TAKE ONE CAPSULE BY MOUTH EVERY DAY, Disp: 30 capsule, Rfl: 2 .  loratadine (CLARITIN REDITABS) 10 MG dissolvable tablet, Take 1 tablet (10 mg total) by mouth daily., Disp: 90 tablet, Rfl: 3 .  metoprolol succinate (TOPROL-XL) 50 MG 24 hr tablet, TAKE 1 TABLET BY MOUTH EVERY DAY, Disp: 90 tablet, Rfl: 1 .  montelukast (SINGULAIR) 10 MG tablet, Take 1 tablet (10 mg total) by mouth daily., Disp: 90 tablet, Rfl: 1 .  omeprazole (PRILOSEC) 20 MG capsule, TAKE ONE CAPSULE BY MOUTH TWICE A DAY, Disp: 60 capsule, Rfl: 5 .  ondansetron (ZOFRAN) 4 MG tablet, Take 1 tablet (4 mg total) by mouth every 8 (eight) hours as needed for nausea or vomiting., Disp: 20 tablet, Rfl: 0 .  Oxycodone HCl 10 MG TABS, Take 1 tablet (10 mg total) by mouth every 6 (six) hours as needed., Disp: 120 tablet, Rfl: 0 .  [START ON 06/12/2016] Oxycodone HCl 10 MG TABS, Take 1 tablet (10 mg total) by mouth every 6 (six) hours as needed., Disp: 120 tablet, Rfl: 0 .  Oxycodone HCl 10 MG TABS, Take 1 tablet (10 mg total) by mouth every 6 (six) hours as needed., Disp: 120 tablet, Rfl: 0 .  Spacer/Aero-Holding Chambers (AEROCHAMBER MV) inhaler, by Does not apply route., Disp: , Rfl:  .  valACYclovir (VALTREX) 1000 MG tablet, TAKE TWO TABLETS BY MOUTH TWICE DAILY FOR ONE DAY FEVER BLISTER (Patient not taking: Reported on 05/26/2016), Disp: 4 tablet, Rfl: 5  Review of Systems  HENT: Positive for hoarse voice, rhinorrhea, sneezing and sore throat. Negative for congestion, ear discharge and ear pain.   Respiratory: Negative for cough and shortness of breath.   Gastrointestinal: Negative for abdominal pain, diarrhea and vomiting.  Musculoskeletal: Positive for neck pain.  Neurological: Positive for headaches.    Social History  Substance Use Topics  . Smoking status: Never Smoker  . Smokeless tobacco: Never Used  . Alcohol use No   Objective:   BP 120/82  (BP Location: Right Arm, Patient Position: Sitting, Cuff Size: Normal)   Pulse 60   Temp 98.4 F (36.9 C) (Oral)   Resp 16   Wt 153 lb (69.4 kg)   BMI 26.26 kg/m  Vitals:   05/26/16 0931  BP: 120/82  Pulse: 60  Resp: 16  Temp: 98.4 F (36.9 C)  TempSrc: Oral  Weight: 153 lb (69.4 kg)     Physical Exam  Constitutional: She is oriented to person, place, and time. She appears well-developed and well-nourished. No distress.  HENT:  Head: Normocephalic and atraumatic.  Right Ear: Hearing and external ear normal.  Left Ear: Hearing and external ear normal.  Slightly reddened nasal membranes. Pharynx clean with minimal redness.  Eyes: Conjunctivae and lids are normal. Right eye exhibits no discharge. Left eye exhibits no discharge. No scleral icterus.  Neck:  Stiffness from past cervical fusion. Unable to rotate, extend or flex neck significantly.  Cardiovascular: Normal rate and regular rhythm.   Pulmonary/Chest: Effort normal and breath sounds normal. No respiratory distress.  Abdominal: Soft. Bowel sounds are normal.  Musculoskeletal: Normal range of motion.  Lymphadenopathy:    She has no cervical adenopathy.  Neurological: She is alert and oriented to person, place, and time.  Skin: Skin is intact. No lesion and no rash noted.  Psychiatric: She has a normal mood and affect. Her speech is normal and behavior is normal. Thought content normal.      Assessment & Plan:     1. Sore throat Started 3 days ago and having some associated PND with sneezing and rhinorrhea. No fever. Three grandchildren (living with her) were diagnosed with strep throat in the past few days. Strep test negative for her today. Allergic to many antibiotics, given Z-pak (no reaction in the past) to use if fever develops or no relief from saltwater gargles and allergy medications Recheck prn. - azithromycin (ZITHROMAX) 250 MG tablet; Take 2 tablets by mouth the first day then one daily for 4 days.   Dispense: 6 tablet; Refill: 0 - POCT rapid strep A  2. Seasonal allergic rhinitis due to pollen, unspecified chronicity Usually has rhinorrhea, sneezing and stuffiness in the Spring and Fall. Symptoms controlled by use of Flonase daily and prn use of Claritin. Will refill Flonase. - fluticasone (FLONASE) 50 MCG/ACT nasal spray; Place 2 sprays into both nostrils daily.  Dispense: 16 g; Refill: 3  3. Chronic pain syndrome Followed by Dr. Dossie Arbour (pain management) due to cervical and back surgeries and chronic shoulder pains.     Patient seen and examined by Vernie Murders, PA, and note scribed by Renaldo Fiddler, CMA.  Vernie Murders, PA  [facility]

## 2016-06-11 HISTORY — PX: BREAST REDUCTION SURGERY: SHX8

## 2016-06-14 ENCOUNTER — Encounter: Payer: Self-pay | Admitting: Pain Medicine

## 2016-06-14 ENCOUNTER — Ambulatory Visit: Payer: Medicare Other | Attending: Pain Medicine | Admitting: Pain Medicine

## 2016-06-14 VITALS — BP 139/84 | HR 68 | Temp 98.3°F | Resp 18 | Ht 64.0 in | Wt 150.0 lb

## 2016-06-14 DIAGNOSIS — I10 Essential (primary) hypertension: Secondary | ICD-10-CM | POA: Diagnosis not present

## 2016-06-14 DIAGNOSIS — Z87442 Personal history of urinary calculi: Secondary | ICD-10-CM | POA: Diagnosis not present

## 2016-06-14 DIAGNOSIS — G8929 Other chronic pain: Secondary | ICD-10-CM

## 2016-06-14 DIAGNOSIS — Z90710 Acquired absence of both cervix and uterus: Secondary | ICD-10-CM | POA: Insufficient documentation

## 2016-06-14 DIAGNOSIS — M545 Low back pain: Secondary | ICD-10-CM | POA: Insufficient documentation

## 2016-06-14 DIAGNOSIS — K589 Irritable bowel syndrome without diarrhea: Secondary | ICD-10-CM | POA: Diagnosis not present

## 2016-06-14 DIAGNOSIS — M19012 Primary osteoarthritis, left shoulder: Secondary | ICD-10-CM | POA: Diagnosis not present

## 2016-06-14 DIAGNOSIS — Z79899 Other long term (current) drug therapy: Secondary | ICD-10-CM | POA: Diagnosis not present

## 2016-06-14 DIAGNOSIS — R42 Dizziness and giddiness: Secondary | ICD-10-CM | POA: Diagnosis not present

## 2016-06-14 DIAGNOSIS — M21371 Foot drop, right foot: Secondary | ICD-10-CM | POA: Diagnosis not present

## 2016-06-14 DIAGNOSIS — Z9889 Other specified postprocedural states: Secondary | ICD-10-CM | POA: Insufficient documentation

## 2016-06-14 DIAGNOSIS — M19011 Primary osteoarthritis, right shoulder: Secondary | ICD-10-CM | POA: Insufficient documentation

## 2016-06-14 DIAGNOSIS — Z91013 Allergy to seafood: Secondary | ICD-10-CM | POA: Insufficient documentation

## 2016-06-14 DIAGNOSIS — Z96641 Presence of right artificial hip joint: Secondary | ICD-10-CM | POA: Insufficient documentation

## 2016-06-14 DIAGNOSIS — M1610 Unilateral primary osteoarthritis, unspecified hip: Secondary | ICD-10-CM | POA: Insufficient documentation

## 2016-06-14 DIAGNOSIS — F329 Major depressive disorder, single episode, unspecified: Secondary | ICD-10-CM | POA: Diagnosis not present

## 2016-06-14 DIAGNOSIS — Z8249 Family history of ischemic heart disease and other diseases of the circulatory system: Secondary | ICD-10-CM | POA: Diagnosis not present

## 2016-06-14 DIAGNOSIS — M25511 Pain in right shoulder: Secondary | ICD-10-CM

## 2016-06-14 DIAGNOSIS — J45909 Unspecified asthma, uncomplicated: Secondary | ICD-10-CM | POA: Insufficient documentation

## 2016-06-14 DIAGNOSIS — M542 Cervicalgia: Secondary | ICD-10-CM | POA: Diagnosis not present

## 2016-06-14 DIAGNOSIS — Z9049 Acquired absence of other specified parts of digestive tract: Secondary | ICD-10-CM | POA: Insufficient documentation

## 2016-06-14 DIAGNOSIS — E871 Hypo-osmolality and hyponatremia: Secondary | ICD-10-CM | POA: Insufficient documentation

## 2016-06-14 DIAGNOSIS — Z803 Family history of malignant neoplasm of breast: Secondary | ICD-10-CM | POA: Diagnosis not present

## 2016-06-14 DIAGNOSIS — Z885 Allergy status to narcotic agent status: Secondary | ICD-10-CM | POA: Insufficient documentation

## 2016-06-14 DIAGNOSIS — Z880 Allergy status to penicillin: Secondary | ICD-10-CM | POA: Insufficient documentation

## 2016-06-14 DIAGNOSIS — Z888 Allergy status to other drugs, medicaments and biological substances status: Secondary | ICD-10-CM | POA: Insufficient documentation

## 2016-06-14 DIAGNOSIS — M25512 Pain in left shoulder: Secondary | ICD-10-CM

## 2016-06-14 DIAGNOSIS — R51 Headache: Secondary | ICD-10-CM | POA: Diagnosis not present

## 2016-06-14 NOTE — Patient Instructions (Addendum)
Please read the Post Op Pain Management sheet and given to your surgeon. Do not eat or drink 2 hours before your procedure. You may take all your morning meds, outside of the 2 hours prior to your appointment.

## 2016-06-14 NOTE — Progress Notes (Signed)
Patient's Name: [name]  MRN: [id number]  Referring Provider: Jerrol Banana.,*  DOB: [birth year]  PCP: Jerrol Banana., MD  DOS: 06/14/2016  Note by: Kathlen Brunswick. Dossie Arbour, MD  Service setting: Ambulatory outpatient  Specialty: Interventional Pain Management  Location: [facility] (AMB) Pain Management Facility    Patient type: Established   Primary Reason(s) for Visit: Encounter for post-procedure evaluation of chronic illness with mild to moderate exacerbation CC: Neck Pain (going into left shoulder)  HPI  [name] is a 56 y.o. year old, female patient, who comes today for a post-procedure evaluation. She has Chronic neck pain; Anxiety state; Asthma, mild; Blurred vision; Benign paroxysmal positional nystagmus; Congenital renal agenesis and dysgenesis; Clinical depression; Colon, diverticulosis; Fatigue; Cannot sleep; IBS (irritable bowel syndrome); Awareness of heartbeats; RAD (reactive airway disease); Apnea, sleep; Esophagitis, reflux; DOE (dyspnea on exertion); Chronic left shoulder pain; Chronic right hip pain; Encounter for therapeutic drug level monitoring; Long term current use of opiate analgesic; Long term prescription opiate use; Opiate use (60 MME/Day); Chronic pain syndrome; Steroid intolerance; Cervical spine pain; Cervical spondylosis; Cervical facet arthropathy (bilateral); History of cervical spinal surgery; Chronic shoulder pain (Location of Secondary source of pain) (Bilateral) (L>R); 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 Her primarily concern today is the Neck Pain (going into left shoulder)  Pain Assessment: Self-Reported Pain Score: 2 /10             Reported level is compatible with observation.       Pain Type: Chronic pain Pain Location: Neck Pain Orientation: Left Pain Descriptors / Indicators: Burning, Throbbing Pain Frequency: Constant  [name] comes in today for post-procedure evaluation after the treatment done on 05/17/2016. The patient returns today after having completed a diagnostic bilateral suprascapular nerve block under fluoroscopic guidance, without IV sedation or steroids. She indicated 100% relief for the duration of local anesthetic but no long-term benefit. She is interested in looking at the possibility of doing radiofrequency ablation.  Further details on both, my assessment(s), as well as the proposed treatment plan, please see below.  Post-Procedure Assessment  05/17/2016 Procedure: Diagnostic bilateral suprascapular nerve block under fluoroscopic guidance, no sedation. (No steroids) Pre-procedure pain score:  4/10 Post-procedure pain score: 0/10 (100% relief) Influential Factors: BMI: 25.75 kg/m Intra-procedural challenges: None observed Assessment challenges: None detected         Post-procedural side-effects, adverse reactions, or complications: None reported Reported issues: None  Sedation: No sedation used. When no sedatives are used, the analgesic levels obtained are directly associated to the effectiveness of the local anesthetics. However, when sedation is provided, the level of analgesia obtained during the initial 1 hour following the intervention, is believed to be the result of a combination of factors. These factors may include, but are not limited to: 1. The effectiveness of the  local anesthetics used. 2. The effects of the analgesic(s) and/or anxiolytic(s) used. 3. The degree of discomfort experienced by the patient at the time of  the procedure. 4. The patients ability and reliability in recalling and recording the events. 5. The presence and influence of possible secondary gains and/or psychosocial factors. Reported result: Relief experienced during the 1st hour after the procedure: 100 % (Ultra-Short Term Relief) Interpretative annotation: Analgesia during this period is likely to be Local Anesthetic and/or IV Sedative (Analgesic/Anxiolitic) related.          Effects of local anesthetic: The analgesic effects attained during this period are directly associated to the localized infiltration of local anesthetics and therefore cary significant diagnostic value as to the etiological location, or anatomical origin, of the pain. Expected duration of relief is directly dependent on the pharmacodynamics of the local anesthetic used. Long-acting (4-6 hours) anesthetics used.  Reported result: Relief during the next 4 to 6 hour after the procedure: 0 % (Short-Term Relief) Interpretative annotation: Patient did not keep pain diary.          Long-term benefit: Defined as the period of time past the expected duration of local anesthetics. With the possible exception of prolonged sympathetic blockade from the local anesthetics, benefits during this period are typically attributed to, or associated with, other factors such as analgesic sensory neuropraxia, antiinflammatory effects, or beneficial biochemical changes provided by agents other than the local anesthetics Reported result: Extended relief following procedure: 0 % (Long-Term Relief) Interpretative annotation: No long-term benefit expected. [name] requested to have no steroids injected          Current benefits: Defined as persistent relief that continues at this point in time.   Reported results: Treated area: 0 %        Interpretative annotation: Ongoing benefits would suggest effective therapeutic approach  Interpretation: Results would suggest a successful diagnostic intervention.          Laboratory Chemistry  Inflammation Markers Lab Results  Component Value Date   CRP 0.5 04/05/2015   ESRSEDRATE 37 (H) 04/05/2015   (CRP: Acute Phase) (ESR: Chronic Phase) Renal Function Markers Lab Results  Component Value Date   BUN 11 01/27/2016   CREATININE 0.85 01/27/2016   GFRAA 89 01/27/2016   GFRNONAA 77 01/27/2016   Hepatic Function Markers Lab Results  Component Value Date   AST 15 01/27/2016   ALT 8 01/27/2016   ALBUMIN 4.3 01/27/2016   ALKPHOS 118 (H) 01/27/2016   Electrolytes Lab Results  Component Value Date   NA 138 01/27/2016   K 4.4 01/27/2016   CL 99 01/27/2016   CALCIUM 9.5 01/27/2016   MG 2.1 04/05/2015   Neuropathy Markers No results found for: [id number] Bone Pathology Markers Lab Results  Component Value Date   ALKPHOS 118 (H) 01/27/2016   CALCIUM 9.5 01/27/2016   Coagulation Parameters Lab Results  Component Value Date   INR 1.05 03/06/2015   LABPROT 13.9 03/06/2015   APTT 33 03/06/2015   PLT 305 01/27/2016   Cardiovascular Markers Lab Results  Component Value Date   HGB 14.2 04/06/2015   HCT 38.4 01/27/2016   Note: Lab results reviewed.  Recent Diagnostic Imaging Review  Dg C-arm 1-60 Min-no Report  Result Date: 05/17/2016 Fluoroscopy was utilized by the requesting physician.  No radiographic interpretation.   Note: Imaging results reviewed.          Meds  The patient has a current medication list which includes the following prescription(s): albuterol, alprazolam, azithromycin, bupropion, escitalopram, estradiol, fluticasone, fluticasone-salmeterol, linzess, loratadine, metoprolol succinate, montelukast, omeprazole, ondansetron, oxycodone hcl, oxycodone hcl, oxycodone hcl, aerochamber mv, and valacyclovir.  Current  Outpatient Prescriptions on File  Prior to Visit  Medication Sig  . albuterol (PROVENTIL HFA;VENTOLIN HFA) 108 (90 BASE) MCG/ACT inhaler Inhale 2 puffs into the lungs every 6 (six) hours as needed for wheezing.  Marland Kitchen ALPRAZolam (XANAX) 1 MG tablet TAKE 1/2 TAB BY MOUTH EVERY MORNING, 1/2 TAB AT LUNCH, AND 2 TABS AT BEDTIME (Patient taking differently: 2 TABS AT BEDTIME)  . azithromycin (ZITHROMAX) 250 MG tablet Take 2 tablets by mouth the first day then one daily for 4 days.  Marland Kitchen buPROPion (WELLBUTRIN SR) 150 MG 12 hr tablet Take 1 tablet (150 mg total) by mouth 2 (two) times daily.  Marland Kitchen escitalopram (LEXAPRO) 10 MG tablet Take 1 tablet (10 mg total) by mouth at bedtime. Take 1/2 tab PO q h.s. X 1 week then increase to 1 tab PO q h.s.  . estradiol (ESTRACE) 0.1 MG/GM vaginal cream Place 1 Applicatorful vaginally 3 (three) times a week.  . fluticasone (FLONASE) 50 MCG/ACT nasal spray Place 2 sprays into both nostrils daily.  . Fluticasone-Salmeterol (ADVAIR DISKUS) 500-50 MCG/DOSE AEPB Inhale 1 puff into the lungs 2 (two) times daily.  Marland Kitchen LINZESS 290 MCG CAPS capsule TAKE ONE CAPSULE BY MOUTH EVERY DAY  . loratadine (CLARITIN REDITABS) 10 MG dissolvable tablet Take 1 tablet (10 mg total) by mouth daily.  . metoprolol succinate (TOPROL-XL) 50 MG 24 hr tablet TAKE 1 TABLET BY MOUTH EVERY DAY  . montelukast (SINGULAIR) 10 MG tablet Take 1 tablet (10 mg total) by mouth daily.  Marland Kitchen omeprazole (PRILOSEC) 20 MG capsule TAKE ONE CAPSULE BY MOUTH TWICE A DAY  . ondansetron (ZOFRAN) 4 MG tablet Take 1 tablet (4 mg total) by mouth every 8 (eight) hours as needed for nausea or vomiting.  . Oxycodone HCl 10 MG TABS Take 1 tablet (10 mg total) by mouth every 6 (six) hours as needed.  . Oxycodone HCl 10 MG TABS Take 1 tablet (10 mg total) by mouth every 6 (six) hours as needed.  . Oxycodone HCl 10 MG TABS Take 1 tablet (10 mg total) by mouth every 6 (six) hours as needed.  Marland Kitchen Spacer/Aero-Holding Chambers (AEROCHAMBER MV) inhaler by Does not apply route.  .  valACYclovir (VALTREX) 1000 MG tablet TAKE TWO TABLETS BY MOUTH TWICE DAILY FOR ONE DAY FEVER BLISTER (Patient not taking: Reported on 05/26/2016)   No current facility-administered medications on file prior to visit.    ROS  Constitutional: Denies any fever or chills Gastrointestinal: No reported hemesis, hematochezia, vomiting, or acute GI distress Musculoskeletal: Denies any acute onset joint swelling, redness, loss of ROM, or weakness Neurological: No reported episodes of acute onset apraxia, aphasia, dysarthria, agnosia, amnesia, paralysis, loss of coordination, or loss of consciousness  Allergies  Ms. Stehr is allergic to amoxicillin; clindamycin; codeine; sulfa antibiotics; shellfish allergy; decadron [dexamethasone]; papaya derivatives; prednisone; betadine [povidone iodine]; chlorhexidine; clarithromycin; clindamycin/lincomycin; and other.  [place]  Drug: Ms. Petrizzo  reports that she does not use drugs. Alcohol:  reports that she does not drink alcohol. Tobacco:  reports that she has never smoked. She has never used smokeless tobacco. 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); Anemia; Anxiety; Asthma; Cephalalgia (08/18/2014); Cervical disc disease (08/18/2014); Chronic headaches; DDD (degenerative disc disease); Depression; Dizziness (04/22/2013); Duodenal ulcer with hemorrhage and perforation ([place]) (04/27/2003); GERD (gastroesophageal reflux disease); H/O arthrodesis (08/18/2014); Headache(784.0); History of blood transfusion; History of cervical spinal surgery (01/04/2015); History of kidney stones; Hypertension; Inverted T wave; Narrowing of intervertebral disc space (08/18/2014);  Orthostatic hypotension (04/22/2013); Pain; Pneumonia; PONV (postoperative nausea and vomiting); Postop Hyponatremia (05/14/2012); Postoperative anemia due to acute blood loss (05/14/2012); Right foot drop; and Right hip arthralgia (08/18/2014). Family: family history includes Aneurysm in  her maternal grandmother and mother; Breast cancer (age of onset: 37) in her paternal grandmother.  Past Surgical History:  Procedure Laterality Date  . ABDOMINAL HYSTERECTOMY    . ANTERIOR CERVICAL DECOMP/DISCECTOMY FUSION N/A 10/30/2012   Procedure: ACDF C5-6, EXPLORATION AND HARDWARE REMOVAL C6-7;  Surgeon: Melina Schools, MD;  Location: [facility];  Service: Orthopedics;  Laterality: N/A;  . ANTERIOR FUSION CERVICAL SPINE  MAY 2012   AT [facility]  . BACK SURGERY  2009   LUMBAR FUSION WITH RODS   . BREAST BIOPSY Right 2008   benign.- bx/clip  . BREAST EXCISIONAL BIOPSY Left 1998   benign  . CARPAL TUNNEL RELEASE  05-06-12   Right  . CHOLECYSTECTOMY    . DIAGNOSTIC LAPAROSCOPIES - MULTIPLE FOR ENDOMETRIOSIS    . HIP ARTHROSCOPY  09/20/2011   Procedure: ARTHROSCOPY HIP;  Surgeon: Gearlean Alf, MD;  Location: [facility];  Service: Orthopedics;  Laterality: Right;  Right Hip Scope with Labral Debridement  . NASAL SEPTUM SURGERY  MARCH 2013   IN [place]  . POSTERIOR CERVICAL FUSION/FORAMINOTOMY N/A 04/16/2013   Procedure: REMOVAL CERVICAL PLATES AND INTERBODY CAGE/POSTERIOR CERVICAL SPINAL FUSION C4 - C6/C5 CORPECTOMY/C4 - C6 FUSION WITH ILIAC CREST BONE GRAFT;  Surgeon: Melina Schools, MD;  Location: [facility];  Service: Orthopedics;  Laterality: N/A;  . RIGHT HIP ARTHROSCOPY FOR LABRAL TEAR  ABOUT 2010   2012 also  . SHOULDER ARTHROSCOPY  05-06-12   bone spur  . TONSILLECTOMY     AS A CHILD  . TOTAL HIP ARTHROPLASTY Right 05/13/2012   Procedure: TOTAL HIP ARTHROPLASTY ANTERIOR APPROACH;  Surgeon: Gearlean Alf, MD;  Location: [facility];  Service: Orthopedics;  Laterality: Right;   Constitutional Exam  General appearance: Well nourished, well developed, and well hydrated. In no apparent acute distress Vitals:   06/14/16 1334  BP: 139/84  Pulse: 68  Resp: 18  Temp: 98.3 F (36.8 C)  TempSrc: Oral  SpO2: 99%  Weight: 150 lb (68 kg)  Height: 5' 4" (1.626 m)   BMI Assessment: Estimated body mass  index is 25.75 kg/m as calculated from the following:   Height as of this encounter: 5' 4" (1.626 m).   Weight as of this encounter: 150 lb (68 kg).  BMI interpretation table: BMI level Category Range association with higher incidence of chronic pain  <18 kg/m2 Underweight   18.5-24.9 kg/m2 Ideal body weight   25-29.9 kg/m2 Overweight Increased incidence by 20%  30-34.9 kg/m2 Obese (Class I) Increased incidence by 68%  35-39.9 kg/m2 Severe obesity (Class II) Increased incidence by 136%  >40 kg/m2 Extreme obesity (Class III) Increased incidence by 254%   BMI Readings from Last 4 Encounters:  06/14/16 25.75 kg/m  05/26/16 26.26 kg/m  05/17/16 25.75 kg/m  04/13/16 25.75 kg/m   Wt Readings from Last 4 Encounters:  06/14/16 150 lb (68 kg)  05/26/16 153 lb (69.4 kg)  05/17/16 150 lb (68 kg)  04/13/16 150 lb (68 kg)  Psych/Mental status: Alert, oriented x 3 (person, place, & time)       Eyes: PERLA Respiratory: No evidence of acute respiratory distress  Cervical Spine Exam  Inspection: No masses, redness, or swelling Alignment: Symmetrical Functional ROM: Unrestricted ROM Stability: No instability detected Muscle strength & Tone: Functionally intact Sensory:  Unimpaired Palpation: No palpable anomalies  Upper Extremity (UE) Exam    Side: Right upper extremity  Side: Left upper extremity  Inspection: No masses, redness, swelling, or asymmetry. No contractures  Inspection: No masses, redness, swelling, or asymmetry. No contractures  Functional ROM: Unrestricted ROM          Functional ROM: Unrestricted ROM          Muscle strength & Tone: Functionally intact  Muscle strength & Tone: Functionally intact  Sensory: Unimpaired  Sensory: Unimpaired  Palpation: No palpable anomalies  Palpation: No palpable anomalies  Specialized Test(s): Deferred         Specialized Test(s): Deferred          Thoracic Spine Exam  Inspection: No masses, redness, or swelling Alignment:  Symmetrical Functional ROM: Unrestricted ROM Stability: No instability detected Sensory: Unimpaired Muscle strength & Tone: No palpable anomalies  Lumbar Spine Exam  Inspection: No masses, redness, or swelling Alignment: Symmetrical Functional ROM: Unrestricted ROM Stability: No instability detected Muscle strength & Tone: Functionally intact Sensory: Unimpaired Palpation: No palpable anomalies Provocative Tests: Lumbar Hyperextension and rotation test: evaluation deferred today       Patrick's Maneuver: evaluation deferred today              Gait & Posture Assessment  Ambulation: Unassisted Gait: Relatively normal for age and body habitus Posture: WNL   Lower Extremity Exam    Side: Right lower extremity  Side: Left lower extremity  Inspection: No masses, redness, swelling, or asymmetry. No contractures  Inspection: No masses, redness, swelling, or asymmetry. No contractures  Functional ROM: Unrestricted ROM          Functional ROM: Unrestricted ROM          Muscle strength & Tone: Functionally intact  Muscle strength & Tone: Functionally intact  Sensory: Unimpaired  Sensory: Unimpaired  Palpation: No palpable anomalies  Palpation: No palpable anomalies   Assessment  Primary Diagnosis & Pertinent Problem List: The primary encounter diagnosis was Chronic shoulder pain (Location of Secondary source of pain) (Bilateral) (L>R). 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  Status Diagnosis  Controlled Controlled Controlled 1. Chronic shoulder pain (Location of Secondary source of pain) (Bilateral) (L>R)   2. 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  New Prescriptions   No medications on file   Medications administered today: Ms. Glacken had no medications administered during this visit. Lab-work,  procedure(s), and/or referral(s): Orders Placed This Encounter  Procedures  . SUPRASCAPULAR NERVE BLOCK   Imaging and/or referral(s): None  Interventional therapies: Planned, scheduled, and/or pending:   Diagnostic bilateral suprascapular nerve block    Considering:   Diagnostic bilateral suprascapular nerve block  Possible bilateral suprascapular nerve RFA    Palliative PRN treatment(s):   Diagnostic bilateral suprascapular nerve block    Provider-requested follow-up: Return for procedure (ASAP).  Future Appointments Date Time Provider [place]  06/19/2016 10:00 AM Milinda Pointer, MD [facility] None  07/11/2016 10:00 AM Crystal Dorrene German, NP [facility] None   Primary Care Physician: Jerrol Banana., MD Location: [facility] Note by: Kathlen Brunswick. Dossie Arbour, M.D, DABA, DABAPM, DABPM, DABIPP, FIPP Date: 06/14/2016; Time: 4:07 PM  Pain Score Disclaimer: We use the NRS-11 scale. This is a self-reported, subjective measurement of pain severity with only modest accuracy. It is  used primarily to identify changes within a particular patient. It must be understood that outpatient pain scales are significantly less accurate that those used for research, where they can be applied under ideal controlled circumstances with minimal exposure to variables. In reality, the score is likely to be a combination of pain intensity and pain affect, where pain affect describes the degree of emotional arousal or changes in action readiness caused by the sensory experience of pain. Factors such as social and work situation, setting, emotional state, anxiety levels, expectation, and prior pain experience may influence pain perception and show large inter-individual differences that may also be affected by time variables.  Patient instructions provided during this appointment: Patient Instructions  Please read the Post Op Pain Management sheet and given to your surgeon. Do  not eat or drink 2 hours before your procedure. You may take all your morning meds, outside of the 2 hours prior to your appointment.

## 2016-06-15 ENCOUNTER — Ambulatory Visit (INDEPENDENT_AMBULATORY_CARE_PROVIDER_SITE_OTHER): Payer: Medicare Other | Admitting: Physician Assistant

## 2016-06-15 ENCOUNTER — Encounter: Payer: Self-pay | Admitting: Physician Assistant

## 2016-06-15 VITALS — BP 122/68 | HR 72 | Temp 98.6°F | Resp 16 | Wt 156.0 lb

## 2016-06-15 DIAGNOSIS — W57XXXA Bitten or stung by nonvenomous insect and other nonvenomous arthropods, initial encounter: Secondary | ICD-10-CM

## 2016-06-15 DIAGNOSIS — L538 Other specified erythematous conditions: Secondary | ICD-10-CM

## 2016-06-15 MED ORDER — PREDNISONE 10 MG PO TABS: 10.0000 mg | ORAL_TABLET | Freq: Every day | ORAL | 0 refills | Status: AC

## 2016-06-15 NOTE — Patient Instructions (Signed)
Bedbugs Bedbugs are tiny bugs that live in and around beds. During the day, they stay hidden. At night, they come out and bite. Where are bedbugs found? Bedbugs can be found anywhere. It does not matter if a place is clean or dirty. They are often found in:  Hotels.  Shelters.  Dorms.  Hospitals.  Nursing homes.  Places where there are many birds or bats. What are bedbug bites like? A bedbug bite leaves a small red bump with a darker red dot in the middle. The bump may show up soon after a person is bitten or a day or more later. Bedbug bites usually do not hurt, but they may itch. Most people do not need treatment for bedbug bites. The bumps usually go away on their own in a few days. How do I check for bedbugs? Bedbugs are reddish-brown, oval, and flat. They are very small and they cannot fly. Look for bedbugs in these places:  On mattresses, bed frames, headboards, and box springs.  On drapes and curtains in bedrooms.  Under the carpet in bedrooms.  Behind electrical outlets.  Behind any wallpaper that is peeling.  Inside luggage. Also look for black or red spots or stains on or near the bed. What should I do if I find bedbugs? When Traveling  Check your clothes, suitcase, and belongings for bedbugs before you go back home. You may want to throw away anything that has bedbugs on it. At Home  Your bedroom may need to be treated by a pest control expert. You may also need to throw away mattresses or luggage. To help keep bedbugs from coming back, you may want to:  Put a plastic cover over your mattress.  Wash your clothes and bedding in water that is hotter than 120F (48.9C). Dry them on a hot setting.  Vacuum often around the bed and in all of the cracks where the bugs might hide.  Check all used furniture, bedding, or clothes that you bring into your home.  Get rid of bird nests and bat roosts that are near your home. In Your Bed  Try wearing pajamas that have  long sleeves and pant legs. Bedbugs usually bite areas of the skin that are not covered. This information is not intended to replace advice given to you by your health care provider. Make sure you discuss any questions you have with your health care provider. Document Released: 06/14/2010 Document Revised: 08/05/2015 Document Reviewed: 02/23/2014 Elsevier Interactive Patient Education  2017 [employer].

## 2016-06-15 NOTE — Progress Notes (Signed)
Patient: [name] Female    DOB: [birth year]   56 y.o.   MRN: [id number] Visit Date: 06/15/2016  Today's Provider: Trinna Post, PA-C   Chief Complaint  Patient presents with  . knot on back   Subjective:    HPI Patient is a 56 y/o woman who comes in today c/o a knot on her upper back. She reports that it has been there since yesterday morning. Patient reports that she was working in the yard on Saturday. Sunday she noticed two lesions on her neck, then the "knot" appeared on her back. Yesterday morning she had two more lesions on her face. Patient has been using antibiotic ointment on the area that is red and swollen. Patient also mentions that the lesions are itchy. She doesn't have flu like symptoms. Doesn't recall any tick bites.     Allergies  Allergen Reactions  . Amoxicillin Hives  . Clindamycin Hives  . Codeine Hives  . Sulfa Antibiotics Nausea And Vomiting and Hives       . Shellfish Allergy Hives  . Decadron [Dexamethasone] Other (See Comments)    Hot flashes, insomnia, "manic"  . Papaya Derivatives Hives  . Prednisone     High blood pressure, flushed, mood changes, heart palpitations  . Betadine [Povidone Iodine] Hives       . Chlorhexidine Hives       . Clarithromycin Hives  . Clindamycin/Lincomycin Hives  . Other Hives     Mango      Current Outpatient Prescriptions:  .  albuterol (PROVENTIL HFA;VENTOLIN HFA) 108 (90 BASE) MCG/ACT inhaler, Inhale 2 puffs into the lungs every 6 (six) hours as needed for wheezing., Disp: , Rfl:  .  ALPRAZolam (XANAX) 1 MG tablet, TAKE 1/2 TAB BY MOUTH EVERY MORNING, 1/2 TAB AT LUNCH, AND 2 TABS AT BEDTIME (Patient taking differently: 2 TABS AT BEDTIME), Disp: 90 tablet, Rfl: 5 .  buPROPion (WELLBUTRIN SR) 150 MG 12 hr tablet, Take 1 tablet (150 mg total) by mouth 2 (two) times daily., Disp: 180 tablet, Rfl: 1 .  escitalopram (LEXAPRO) 10 MG tablet, Take 1 tablet (10 mg total) by mouth at bedtime. Take 1/2 tab  PO q h.s. X 1 week then increase to 1 tab PO q h.s., Disp: 90 tablet, Rfl: 3 .  estradiol (ESTRACE) 0.1 MG/GM vaginal cream, Place 1 Applicatorful vaginally 3 (three) times a week., Disp: 42.5 g, Rfl: 5 .  fluticasone (FLONASE) 50 MCG/ACT nasal spray, Place 2 sprays into both nostrils daily., Disp: 16 g, Rfl: 3 .  Fluticasone-Salmeterol (ADVAIR DISKUS) 500-50 MCG/DOSE AEPB, Inhale 1 puff into the lungs 2 (two) times daily., Disp: 60 each, Rfl: 5 .  LINZESS 290 MCG CAPS capsule, TAKE ONE CAPSULE BY MOUTH EVERY DAY, Disp: 30 capsule, Rfl: 2 .  loratadine (CLARITIN REDITABS) 10 MG dissolvable tablet, Take 1 tablet (10 mg total) by mouth daily., Disp: 90 tablet, Rfl: 3 .  metoprolol succinate (TOPROL-XL) 50 MG 24 hr tablet, TAKE 1 TABLET BY MOUTH EVERY DAY, Disp: 90 tablet, Rfl: 1 .  montelukast (SINGULAIR) 10 MG tablet, Take 1 tablet (10 mg total) by mouth daily., Disp: 90 tablet, Rfl: 1 .  omeprazole (PRILOSEC) 20 MG capsule, TAKE ONE CAPSULE BY MOUTH TWICE A DAY, Disp: 60 capsule, Rfl: 5 .  ondansetron (ZOFRAN) 4 MG tablet, Take 1 tablet (4 mg total) by mouth every 8 (eight) hours as needed for nausea or vomiting., Disp: 20 tablet, Rfl: 0 .  Oxycodone HCl 10 MG TABS, Take 1 tablet (10 mg total) by mouth every 6 (six) hours as needed., Disp: 120 tablet, Rfl: 0 .  Spacer/Aero-Holding Chambers (AEROCHAMBER MV) inhaler, by Does not apply route., Disp: , Rfl:  .  azithromycin (ZITHROMAX) 250 MG tablet, Take 2 tablets by mouth the first day then one daily for 4 days. (Patient not taking: Reported on 06/15/2016), Disp: 6 tablet, Rfl: 0 .  Oxycodone HCl 10 MG TABS, Take 1 tablet (10 mg total) by mouth every 6 (six) hours as needed., Disp: 120 tablet, Rfl: 0 .  Oxycodone HCl 10 MG TABS, Take 1 tablet (10 mg total) by mouth every 6 (six) hours as needed., Disp: 120 tablet, Rfl: 0 .  valACYclovir (VALTREX) 1000 MG tablet, TAKE TWO TABLETS BY MOUTH TWICE DAILY FOR ONE DAY FEVER BLISTER (Patient not taking: Reported  on 05/26/2016), Disp: 4 tablet, Rfl: 5  Review of Systems  Constitutional: Negative for activity change, appetite change, chills, diaphoresis, fatigue, fever and unexpected weight change.  Cardiovascular: Negative for chest pain, palpitations and leg swelling.  Musculoskeletal: Positive for myalgias.  Skin: Positive for rash.  Neurological: Negative for dizziness and headaches.    Social History  Substance Use Topics  . Smoking status: Never Smoker  . Smokeless tobacco: Never Used  . Alcohol use No   Objective:   BP 122/68 (BP Location: Left Arm, Patient Position: Sitting, Cuff Size: Normal)   Pulse 72   Temp 98.6 F (37 C)   Resp 16   Wt 156 lb (70.8 kg)   SpO2 97%   BMI 26.78 kg/m  Vitals:   06/15/16 1057  BP: 122/68  Pulse: 72  Resp: 16  Temp: 98.6 F (37 C)  SpO2: 97%  Weight: 156 lb (70.8 kg)     Physical Exam  Constitutional: She appears well-developed and well-nourished.  Neck: Neck supple.  Cardiovascular: Normal rate and regular rhythm.   Pulmonary/Chest: Effort normal and breath sounds normal.  Lymphadenopathy:    She has no cervical adenopathy.  Skin: Skin is warm and dry. Rash noted. There is erythema.     She has two lesions on her left shoulder. They are 0.5cm in diameter, indurated, with central scabbing. Next to these two lesions is about a 3 cm in diameter area of tissue swelling that is tender to palpation, but not fluctuant, erythematous, or has purulent drainage. She has two more lesions on the left side of her mouth along with another area of tissue swelling. No cervical lymphadinopathy.        Assessment & Plan:     1. Bug bite, initial encounter  Unclear etiology, does look like bites. Lesions not in dermatomal pattern. Will give prednisone to help with swelling. Listed as allergy on chart, but it is more intolerance since symptoms are insomnia, etc. Patient says she tolerates low doses.   - predniSONE (DELTASONE) 10 MG tablet; Take 1  tablet (10 mg total) by mouth daily with breakfast.  Dispense: 5 tablet; Refill: 0  The entirety of the information documented in the History of Present Illness, Review of Systems and Physical Exam were personally obtained by me. Portions of this information were initially documented by [facility] and reviewed by me for thoroughness and accuracy.     Return if symptoms worsen or fail to improve.         Trinna Post, PA-C  [facility]

## 2016-06-19 ENCOUNTER — Encounter: Payer: Self-pay | Admitting: Pain Medicine

## 2016-06-19 ENCOUNTER — Ambulatory Visit
Admission: RE | Admit: 2016-06-19 | Discharge: 2016-06-19 | Disposition: A | Discharge status: Home / Self Care | Payer: Medicare Other | Source: Ambulatory Visit | Attending: Pain Medicine | Admitting: Pain Medicine

## 2016-06-19 ENCOUNTER — Ambulatory Visit (HOSPITAL_BASED_OUTPATIENT_CLINIC_OR_DEPARTMENT_OTHER): Payer: Medicare Other | Admitting: Pain Medicine

## 2016-06-19 VITALS — BP 135/91 | HR 74 | Temp 97.6°F | Resp 16 | Ht 64.0 in | Wt 150.0 lb

## 2016-06-19 DIAGNOSIS — M25511 Pain in right shoulder: Secondary | ICD-10-CM | POA: Diagnosis not present

## 2016-06-19 DIAGNOSIS — M25512 Pain in left shoulder: Secondary | ICD-10-CM | POA: Insufficient documentation

## 2016-06-19 DIAGNOSIS — Z880 Allergy status to penicillin: Secondary | ICD-10-CM | POA: Diagnosis not present

## 2016-06-19 DIAGNOSIS — M19011 Primary osteoarthritis, right shoulder: Secondary | ICD-10-CM | POA: Insufficient documentation

## 2016-06-19 DIAGNOSIS — Z888 Allergy status to other drugs, medicaments and biological substances status: Secondary | ICD-10-CM | POA: Insufficient documentation

## 2016-06-19 DIAGNOSIS — G8929 Other chronic pain: Secondary | ICD-10-CM | POA: Diagnosis not present

## 2016-06-19 DIAGNOSIS — Z885 Allergy status to narcotic agent status: Secondary | ICD-10-CM | POA: Insufficient documentation

## 2016-06-19 DIAGNOSIS — M542 Cervicalgia: Secondary | ICD-10-CM | POA: Diagnosis not present

## 2016-06-19 DIAGNOSIS — Z96641 Presence of right artificial hip joint: Secondary | ICD-10-CM | POA: Diagnosis not present

## 2016-06-19 DIAGNOSIS — M19012 Primary osteoarthritis, left shoulder: Secondary | ICD-10-CM

## 2016-06-19 DIAGNOSIS — Z9889 Other specified postprocedural states: Secondary | ICD-10-CM | POA: Insufficient documentation

## 2016-06-19 DIAGNOSIS — Z90710 Acquired absence of both cervix and uterus: Secondary | ICD-10-CM | POA: Diagnosis not present

## 2016-06-19 DIAGNOSIS — M4322 Fusion of spine, cervical region: Secondary | ICD-10-CM | POA: Diagnosis not present

## 2016-06-19 DIAGNOSIS — Z9049 Acquired absence of other specified parts of digestive tract: Secondary | ICD-10-CM | POA: Insufficient documentation

## 2016-06-19 MED ORDER — ROPIVACAINE HCL 2 MG/ML IJ SOLN
9.0000 mL | Freq: Once | INTRAMUSCULAR | Status: AC
  Administered 2016-06-19: 9 mL
  Filled 2016-06-19: qty 10

## 2016-06-19 MED ORDER — LACTATED RINGERS IV SOLN: 1000.0000 mL | Freq: Once | INTRAVENOUS | Status: DC

## 2016-06-19 MED ORDER — METHYLPREDNISOLONE ACETATE 80 MG/ML IJ SUSP: 80.0000 mg | Freq: Once | INTRAMUSCULAR | Status: DC

## 2016-06-19 MED ORDER — MIDAZOLAM HCL 5 MG/5ML IJ SOLN: 1.0000 mg | INTRAMUSCULAR | Status: DC | PRN

## 2016-06-19 MED ORDER — FENTANYL CITRATE (PF) 100 MCG/2ML IJ SOLN
25.0000 ug | INTRAMUSCULAR | Status: DC | PRN
Start: 2016-06-19 — End: 2016-06-19

## 2016-06-19 NOTE — Patient Instructions (Addendum)
Post-Procedure instructions Instructions:  Apply ice: Fill a plastic sandwich bag with crushed ice. Cover it with a small towel and apply to injection site. Apply for 15 minutes then remove x 15 minutes. Repeat sequence on day of procedure, until you go to bed. The purpose is to minimize swelling and discomfort after procedure.  Apply heat: Apply heat to procedure site starting the day following the procedure. The purpose is to treat any soreness and discomfort from the procedure.  Food intake: Start with clear liquids (like water) and advance to regular food, as tolerated.   Physical activities: Keep activities to a minimum for the first 8 hours after the procedure.   Driving: If you have received any sedation, you are not allowed to drive for 24 hours after your procedure.  Blood thinner: Restart your blood thinner 6 hours after your procedure. (Only for those taking blood thinners)  Insulin: As soon as you can eat, you may resume your normal dosing schedule. (Only for those taking insulin)  Infection prevention: Keep procedure site clean and dry.  Post-procedure Pain Diary: Extremely important that this be done correctly and accurately. Recorded information will be used to determine the next step in treatment.  Pain evaluated is that of treated area only. Do not include pain from an untreated area.  Complete every hour, on the hour, for the initial 8 hours. Set an alarm to help you do this part accurately.  Do not go to sleep and have it completed later. It will not be accurate.  Follow-up appointment: Keep your follow-up appointment after the procedure. Usually 2 weeks for most procedures. (6 weeks in the case of radiofrequency.) Bring you pain diary.  Expect:  From numbing medicine (AKA: Local Anesthetics): Numbness or decrease in pain.  Onset: Full effect within 15 minutes of injected.  Duration: It will depend on the type of local anesthetic used. On the average, 1 to 8  hours.   From steroids: Decrease in swelling or inflammation. Once inflammation is improved, relief of the pain will follow. (NO STEROIDS injected today)  Onset of benefits: Depends on the amount of swelling present. The more swelling, the longer it will take for the benefits to be seen.   Duration: Steroids will stay in the system x 2 weeks. Duration of benefits will depend on multiple posibilities including persistent irritating factors.  From procedure: Some discomfort is to be expected once the numbing medicine wears off. This should be minimal if ice and heat are applied as instructed. Call if:  You experience numbness and weakness that gets worse with time, as opposed to wearing off.  New onset bowel or bladder incontinence. (Spinal procedures only)  Emergency Numbers:  [place] hours (Monday - Thursday, 8:00 AM - 4:00 PM) (Friday, 9:00 AM - 12:00 Noon): [phone]  After hours: [phone]   __________________________________________________________________________________________   Post-procedure Information What to expect: Most procedures involve the use of a local anesthetic (numbing medicine), and a steroid (anti-inflammatory medicine).  The local anesthetics may cause temporary numbness and weakness of the legs or arms, depending on the location of the block. This numbness/weakness may last 4-6 hours, depending on the local anesthetic used. In rare instances, it can last up to 24 hours. While numb, you must be very careful not to injure the extremity.  After any procedure, you could expect the pain to get better within 15-20 minutes. This relief is temporary and may last 4-6 hours. Once the local anesthetics wears off, you  could experience discomfort, possibly more than usual, for up to 10 (ten) days. In the case of radiofrequencies, it may last up to 6 weeks. Surgeries may take up to 8 weeks for the healing process. The discomfort is due to the irritation  caused by needles going through skin and muscle. To minimize the discomfort, we recommend using ice the first day, and heat from then on. The ice should be applied for 15 minutes on, and 15 minutes off. Keep repeating this cycle until bedtime. Avoid applying the ice directly to the skin, to prevent frostbite. Heat should be used daily, until the pain improves (4-10 days). Be careful not to burn yourself.  Occasionally you may experience muscle spasms or cramps. These occur as a consequence of the irritation caused by the needle sticks to the muscle and the blood that will inevitably be lost into the surrounding muscle tissue. Blood tends to be very irritating to tissues, which tend to react by going into spasm. These spasms may start the same day of your procedure, but they may also take days to develop. This late onset type of spasm or cramp is usually caused by electrolyte imbalances triggered by the steroids, at the level of the kidney. Cramps and spasms tend to respond well to muscle relaxants, multivitamins (some are triggered by the procedure, but may have their origins in vitamin deficiencies), and "Gatorade", or any sports drinks that can replenish any electrolyte imbalances. (If you are a diabetic, ask your pharmacist to get you a sugar-free brand.) Warm showers or baths may also be helpful. Stretching exercises are highly recommended. General Instructions:  Be alert for signs of possible infection: redness, swelling, heat, red streaks, elevated temperature, and/or fever. These typically appear 4 to 6 days after the procedure. Immediately notify your doctor if you experience unusual bleeding, difficulty breathing, or loss of bowel or bladder control. If you experience increased pain, do not increase your pain medicine intake, unless instructed by your pain physician. Post-Procedure Care:  Be careful in moving about. Muscle spasms in the area of the injection may occur. Applying ice or heat to the area  is often helpful. The incidence of spinal headaches after epidural injections ranges between 1.4% and 6%. If you develop a headache that does not seem to respond to conservative therapy, please let your physician know. This can be treated with an epidural blood patch.   Post-procedure numbness or redness is to be expected, however it should average 4 to 6 hours. If numbness and weakness of your extremities begins to develop 4 to 6 hours after your procedure, and is felt to be progressing and worsening, immediately contact your physician.   Diet:  If you experience nausea, do not eat until this sensation goes away. If you had a "Stellate Ganglion Block" for upper extremity "Reflex Sympathetic Dystrophy", do not eat or drink until your hoarseness goes away. In any case, always start with liquids first and if you tolerate them well, then slowly progress to more solid foods. Activity:  For the first 4 to 6 hours after the procedure, use caution in moving about as you may experience numbness and/or weakness. Use caution in cooking, using household electrical appliances, and climbing steps. If you need to reach your Doctor call our office: [phone]) [phone] Monday-Thursday 8:00 am - 4:00 PM    Fridays: Closed     In case of an emergency: In case of emergency, call 911 or go to the nearest emergency room and have the  physician there call us.  Interpretation of Procedure Every nerve block has two components: a diagnostic component, and a treatment component. Unrealistic expectations are the most common causes of "perceived failure".  In a perfect world, a single nerve block should be able to completely and permanently eliminate the pain. Sadly, the world is not perfect.  Most pain management nerve blocks are performed using local anesthetics and steroids. Steroids are responsible for any long-term benefit that you may experience. Their purpose is to decrease any chronic swelling that may exist in the area.  Steroids begin to work immediately after being injected. However, most patients will not experience any benefits until 5 to 10 days after the injection, when the swelling has come down to the point where they can tell a difference. Steroids will only help if there is swelling to be treated. As such, they can assist with the diagnosis. If effective, they suggest an inflammatory component to the pain, and if ineffective, they rule out inflammation as the main cause or component of the problem. If the problem is one of mechanical compression, you will get no benefit from those steroids.   In the case of local anesthetics, they have a crucial role in the diagnosis of your condition. Most will begin to work within15 to 20 minutes after injection. The duration will depend on the type used (short- vs. Long-acting). It is of outmost importance that patients keep tract of their pain, after the procedure. To assist with this matter, a "Post-procedure Pain Diary" is provided. Make sure to complete it and to bring it back to your follow-up appointment.  As long as the patient keeps accurate, detailed records of their symptoms after every procedure, and returns to have those interpreted, every procedure will provide [place] with invaluable information. Even a block that does not provide the patient with any relief, will always provide [place] with information about the mechanism and the origin of the pain. The only time a nerve block can be considered a waste of time is when patients do not keep track of the results, or do not keep their post-procedure appointment.  Reporting the results back to your physician The Pain Score  Pain is a subjective complaint. It cannot be seen, touched, or measured. We depend entirely on the patient's report of the pain in order to assess your condition and treatment. To evaluate the pain, we use a pain scale, where "0" means "No Pain", and a "10" is "the worst possible pain that you can even  imagine" (i.e. something like been eaten alive by a shark or being torn apart by a lion).   You will frequently be asked to rate your pain. Please be as accurate, remember that medical decisions will be based on your responses. Please do not rate your pain above a 10. Doing so is actually interpreted as "symptom magnification" (exaggeration), as well as lack of understanding with regards to the scale. To put this into perspective, when you tell us that your pain is at a 10 (ten), what you are saying is that there is nothing we can do to make this pain any worse. (Carefully think about that.) Post-Procedure Pain Diary   Name: Date of Service Procedure      Time Period Pain Score Painful Area  Pre-procedure ____/10   Time Period Pain Score Area improved. Area not improved.  15 to 30 min post-procedure ____/10    1st hour after procedure ____/10    2nd hour after procedure ____/10  3rd hour after procedure ____/10    4th hour after procedure ____/10    5th hour after procedure ____/10    6th hour after procedure ____/10    7th hour after procedure ____/10    Time Period Pain Score Area improved. Area not improved.  Note: From here on, always document your pain score 1st thing in the morning.  1st day after procedure ____/10    2nd day after procedure ____/10    3rd day after procedure ____/10    4th day after procedure ____/10    5th day after procedure ____/10    Time Period Pain Score Area improved. Area not improved.  10th day after procedure ____/10    20th day after procedure ____/10     Benefits Indicate for each set of activities if the procedure changes your ability to accomplish them.  Activity Worse No-Change Improved  Dressing, eating, walking, toileting, hygiene     Shopping, housekeeping, food preparation, community transportation     Range of motion of affected area      Your opinion Please indicate which statement best describes your impression of this treatment.   Statement (X)  Based on the results, I am encouraged.   Based on the results, I am disappointed.   I am not sure I have an opinion at this point.    Note: Make sure to complete and return this form to your physician, on your follow-up appointment. This information will be used to interpret the results. Failure to accurately complete, or to return this information, may result in less than optimal outcomes.

## 2016-06-19 NOTE — Progress Notes (Signed)
Patient's Name: [name]  MRN: [id number]  Referring Provider: Milinda Pointer, MD  DOB: September 19, 1960  PCP: Jerrol Banana., MD  DOS: 06/19/2016  Note by: Kathlen Brunswick. Dossie Arbour, MD  Service setting: Ambulatory outpatient  Location: [facility] (AMB) Pain Management Facility  Visit type: Procedure  Specialty: Interventional Pain Management  Patient type: Established   Primary Reason for Visit: Interventional Pain Management Treatment. CC: Shoulder Pain (bilateral, left worse) and Neck Pain  Procedure:  Anesthesia, Analgesia, Anxiolysis:  Type: Diagnostic Suprascapular nerve Block #2 (Without STEROIDS) Region: Posterior Shoulder & Scapular Areas Level: Superior to the scapular spine, in the lateral aspect of the supraspinatus fossa (Suprescapular notch). Laterality: Bilateral  Type: Local Anesthesia Local Anesthetic: Lidocaine 1% Route: Infiltration ([place]/IM) IV Access: Declined Sedation: Declined  Indication(s): Analgesia          Indications: 1. Chronic shoulder pain (Location of Secondary source of pain) (Bilateral) (L>R)   2. 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 (year old), female patient, seen today for interventional treatment. She  has a past surgical history that includes RIGHT HIP ARTHROSCOPY FOR LABRAL TEAR (ABOUT 2010); Anterior fusion cervical spine (MAY 2012); Nasal septum surgery (MARCH 2013); Back surgery (2009); Tonsillectomy; Abdominal hysterectomy; DIAGNOSTIC LAPAROSCOPIES - MULTIPLE FOR ENDOMETRIOSIS; Cholecystectomy; Hip arthroscopy (09/20/2011); Shoulder arthroscopy (05-06-12); Carpal tunnel release (05-06-12); Total hip arthroplasty (Right, 05/13/2012); Anterior cervical decomp/discectomy fusion (N/A, 10/30/2012); Posterior cervical fusion/foraminotomy (N/A, 04/16/2013); Breast biopsy (Right, 2008);  and Breast excisional biopsy (Left, 1998). Her primarily concern today is the Shoulder Pain (bilateral, left worse) and Neck Pain  Initial Vital Signs: Blood pressure 137/78, pulse 74, temperature 97.6 F (36.4 C), resp. rate 18, height 5\' 4"  (1.626 m), weight 150 lb (68 kg), SpO2 100 %. BMI: 25.75 kg/m  Risk Assessment: Allergies: Reviewed. She is allergic to amoxicillin; clindamycin; codeine; sulfa antibiotics; shellfish allergy; decadron [dexamethasone]; papaya derivatives; prednisone; betadine [povidone iodine]; chlorhexidine; clarithromycin; clindamycin/lincomycin; and other.  Allergy Precautions: No radiological contrast used and no steroids. Coagulopathies: "Reviewed. None identified.  Blood-thinner therapy: None at this time Active Infection(s): Reviewed. None identified. [name] is afebrile  Site Confirmation: [name] was asked to confirm the procedure and laterality before marking the site Procedure checklist: Completed Consent: Before the procedure and under the influence of no sedative(s), amnesic(s), or anxiolytics, the patient was informed of the treatment options, risks and possible complications. To fulfill our ethical and legal obligations, as recommended by the American Medical Association's Code of Ethics, I have informed the patient of my clinical impression; the nature and purpose of the treatment or procedure; the risks, benefits, and possible complications of the intervention; the alternatives, including doing nothing; the risk(s) and benefit(s) of the alternative treatment(s) or procedure(s); and the risk(s) and benefit(s) of doing nothing. The patient was provided information about the general risks and possible complications associated with the procedure. These may include, but are not limited to: failure to achieve desired goals, infection, bleeding, organ or nerve damage, allergic reactions, paralysis, and death. In addition, the patient was informed of those risks  and complications associated to the procedure, such as failure to decrease pain; infection; bleeding; organ or nerve damage with subsequent damage to sensory, motor, and/or autonomic systems, resulting in permanent pain, numbness, and/or weakness of one or several areas of the body; allergic reactions; (i.e.: anaphylactic reaction); and/or death.  Furthermore, the patient was informed of those risks and complications associated with the medications. These include, but are not limited to: allergic reactions (i.e.: anaphylactic or anaphylactoid reaction(s)); adrenal axis suppression; blood sugar elevation that in diabetics may result in ketoacidosis or comma; water retention that in patients with history of congestive heart failure may result in shortness of breath, pulmonary edema, and decompensation with resultant heart failure; weight gain; swelling or edema; medication-induced neural toxicity; particulate matter embolism and blood vessel occlusion with resultant organ, and/or nervous system infarction; and/or aseptic necrosis of one or more joints. Finally, the patient was informed that Medicine is not an exact science; therefore, there is also the possibility of unforeseen or unpredictable risks and/or possible complications that may result in a catastrophic outcome. The patient indicated having understood very clearly. We have given the patient no guarantees and we have made no promises. Enough time was given to the patient to ask questions, all of which were answered to the patient's satisfaction. [name] has indicated that she wanted to continue with the procedure. Attestation: I, the ordering provider, attest that I have discussed with the patient the benefits, risks, side-effects, alternatives, likelihood of achieving goals, and potential problems during recovery for the procedure that I have provided informed consent. Date: 06/19/2016; Time: 10:40 AM  Pre-Procedure Preparation:  Monitoring: As per  clinic protocol. Respiration, ETCO2, SpO2, BP, heart rate and rhythm monitor placed and checked for adequate function Safety Precautions: Patient was assessed for positional comfort and pressure points before starting the procedure. Time-out: I initiated and conducted the "Time-out" before starting the procedure, as per protocol. The patient was asked to participate by confirming the accuracy of the "Time Out" information. Verification of the correct person, site, and procedure were performed and confirmed by me, the nursing staff, and the patient. "Time-out" conducted as per Joint Commission's Universal Protocol (UP.01.01.01). "Time-out" Date & Time: 06/19/2016; 1114 hrs.  Description of Procedure Process:   Position: Prone Target Area: Suprascapular notch. Approach: Posterior approach. Area Prepped: Entire shoulder Area Prepping solution: ChloraPrep (2% chlorhexidine gluconate and 70% isopropyl alcohol) Safety Precautions: Aspiration looking for blood return was conducted prior to all injections. At no point did we inject any substances, as a needle was being advanced. No attempts were made at seeking any paresthesias. Safe injection practices and needle disposal techniques used. Medications properly checked for expiration dates. SDV (single dose vial) medications used. Description of the Procedure: Protocol guidelines were followed. The patient was placed in position over the procedure table. The target area was identified and the area prepped in the usual manner. Skin & deeper tissues infiltrated with local anesthetic. Appropriate amount of time allowed to pass for local anesthetics to take effect. The procedure needles were then advanced to the target area. Proper needle placement secured. Negative aspiration confirmed. Solution injected in intermittent fashion, asking for systemic symptoms every 0.5cc of injectate. The needles were then removed and the area cleansed, making sure to leave some of the  prepping solution back to take advantage of its long term bactericidal properties. Vitals:   06/19/16 1007 06/19/16 1114 06/19/16 1120  BP: 137/78 (!) 146/95 (!) 135/91  Pulse: 74    Resp: 18 14 16   Temp: 97.6 F (36.4 C)    SpO2: 100% 99% 99%  Weight: 150 lb (68 kg)    Height: 5\' 4"  (1.626 m)      Start Time: 1114 hrs. End Time: 1119 hrs. Materials:  Needle(s) Type: Regular needle Gauge: 22G Length:  3.5-in Medication(s): We administered ropivacaine (PF) 2 mg/mL (0.2%). Please see chart orders for dosing details.  Imaging Guidance (Non-Spinal):  Type of Imaging Technique: Fluoroscopy Guidance (Non-Spinal) Indication(s): Assistance in needle guidance and placement for procedures requiring needle placement in or near specific anatomical locations not easily accessible without such assistance. Exposure Time: Please see nurses notes. Contrast: Before injecting any contrast, we confirmed that the patient did not have an allergy to iodine, shellfish, or radiological contrast. Once satisfactory needle placement was completed at the desired level, radiological contrast was injected. Contrast injected under live fluoroscopy. No contrast complications. See chart for type and volume of contrast used. Fluoroscopic Guidance: I was personally present during the use of fluoroscopy. "Tunnel Vision Technique" used to obtain the best possible view of the target area. Parallax error corrected before commencing the procedure. "Direction-depth-direction" technique used to introduce the needle under continuous pulsed fluoroscopy. Once target was reached, antero-posterior, oblique, and lateral fluoroscopic projection used confirm needle placement in all planes. Images permanently stored in EMR. Interpretation: I personally interpreted the imaging intraoperatively. Adequate needle placement confirmed in multiple planes. Appropriate spread of contrast into desired area was observed. No evidence of afferent or  efferent intravascular uptake. Permanent images saved into the patient's record.  Antibiotic Prophylaxis:  Indication(s): None identified Antibiotic given: None  Post-operative Assessment:  EBL: None Complications: No immediate post-treatment complications observed by team, or reported by patient. Note: The patient tolerated the entire procedure well. A repeat set of vitals were taken after the procedure and the patient was kept under observation following institutional policy, for this type of procedure. Post-procedural neurological assessment was performed, showing return to baseline, prior to discharge. The patient was provided with post-procedure discharge instructions, including a section on how to identify potential problems. Should any problems arise concerning this procedure, the patient was given instructions to immediately contact us, at any time, without hesitation. In any case, we plan to contact the patient by telephone for a follow-up status report regarding this interventional procedure. Comments:  No additional relevant information.  Plan of Care  Disposition: Discharge home  Discharge Date & Time: 06/19/2016; 1130 hrs.  Physician-requested Follow-up:  Return in about 2 weeks (around 07/03/2016) for Post-Procedure evaluation.  Future Appointments Date Time Provider [place]  07/11/2016 10:00 AM Crystal Dorrene German, NP [facility] None   Medications ordered for procedure: Meds ordered this encounter  Medications  . DISCONTD: fentaNYL (SUBLIMAZE) injection 25-50 mcg    Make sure Narcan is available in the pyxis when using this medication. In the event of respiratory depression (RR< 8/min): Titrate NARCAN (naloxone) in increments of 0.1 to 0.2 mg IV at 2-3 minute intervals, until desired degree of reversal.  . DISCONTD: lactated ringers infusion 1,000 mL  . DISCONTD: midazolam (VERSED) 5 MG/5ML injection 1-2 mg    Make sure Flumazenil is available in the pyxis when using this  medication. If oversedation occurs, administer 0.2 mg IV over 15 sec. If after 45 sec no response, administer 0.2 mg again over 1 min; may repeat at 1 min intervals; not to exceed 4 doses (1 mg)  . DISCONTD: methylPREDNISolone acetate (DEPO-MEDROL) injection 80 mg  . ropivacaine (PF) 2 mg/mL (0.2%) (NAROPIN) injection 9 mL   Medications administered: We administered ropivacaine (PF) 2 mg/mL (0.2%).  See the medical record for exact dosing, route, and time of administration.  Lab-work, Procedure(s), & Referral(s) Ordered: Orders Placed This Encounter  Procedures  . DG C-Arm 1-60 Min-No Report  . Discharge instructions  .  Follow-up  . Informed Consent Details: Transcribe to consent form and obtain patient signature  . Provider attestation of informed consent for procedure/surgical case  . Verify informed consent   Imaging Ordered: Results for orders placed in visit on 05/17/16  DG C-Arm 1-60 Min-No Report   Narrative Fluoroscopy was utilized by the requesting physician.  No radiographic  interpretation.    New Prescriptions   No medications on file   Primary Care Physician: Jerrol Banana., MD Location: [facility] Note by: Kathlen Brunswick. Dossie Arbour, M.D, DABA, DABAPM, DABPM, DABIPP, FIPP Date: 06/19/2016; Time: 3:27 PM  Disclaimer:  Medicine is not an Chief Strategy Officer. The only guarantee in medicine is that nothing is guaranteed. It is important to note that the decision to proceed with this intervention was based on the information collected from the patient. The Data and conclusions were drawn from the patient's questionnaire, the interview, and the physical examination. Because the information was provided in large part by the patient, it cannot be guaranteed that it has not been purposely or unconsciously manipulated. Every effort has been made to obtain as much relevant data as possible for this evaluation. It is important to note that the conclusions that  lead to this procedure are derived in large part from the available data. Always take into account that the treatment will also be dependent on availability of resources and existing treatment guidelines, considered by other Pain Management Practitioners as being common knowledge and practice, at the time of the intervention. For Medico-Legal purposes, it is also important to point out that variation in procedural techniques and pharmacological choices are the acceptable norm. The indications, contraindications, technique, and results of the above procedure should only be interpreted and judged by a Board-Certified Interventional Pain Specialist with extensive familiarity and expertise in the same exact procedure and technique. Attempts at providing opinions without similar or greater experience and expertise than that of the treating physician will be considered as inappropriate and unethical, and shall result in a formal complaint to the state medical board and applicable specialty societies.  Instructions provided at this appointment: Patient Instructions   Post-Procedure instructions Instructions:  Apply ice: Fill a plastic sandwich bag with crushed ice. Cover it with a small towel and apply to injection site. Apply for 15 minutes then remove x 15 minutes. Repeat sequence on day of procedure, until you go to bed. The purpose is to minimize swelling and discomfort after procedure.  Apply heat: Apply heat to procedure site starting the day following the procedure. The purpose is to treat any soreness and discomfort from the procedure.  Food intake: Start with clear liquids (like water) and advance to regular food, as tolerated.   Physical activities: Keep activities to a minimum for the first 8 hours after the procedure.   Driving: If you have received any sedation, you are not allowed to drive for 24 hours after your procedure.  Blood thinner: Restart your blood thinner 6 hours after your  procedure. (Only for those taking blood thinners)  Insulin: As soon as you can eat, you may resume your normal dosing schedule. (Only for those taking insulin)  Infection prevention: Keep procedure site clean and dry.  Post-procedure Pain Diary: Extremely important that this be done correctly and accurately. Recorded information will be used to determine the next step in treatment.  Pain evaluated is that of treated area only. Do not include pain from an untreated area.  Complete every hour, on the hour,  for the initial 8 hours. Set an alarm to help you do this part accurately.  Do not go to sleep and have it completed later. It will not be accurate.  Follow-up appointment: Keep your follow-up appointment after the procedure. Usually 2 weeks for most procedures. (6 weeks in the case of radiofrequency.) Bring you pain diary.  Expect:  From numbing medicine (AKA: Local Anesthetics): Numbness or decrease in pain.  Onset: Full effect within 15 minutes of injected.  Duration: It will depend on the type of local anesthetic used. On the average, 1 to 8 hours.   From steroids: Decrease in swelling or inflammation. Once inflammation is improved, relief of the pain will follow. (NO STEROIDS injected today)  Onset of benefits: Depends on the amount of swelling present. The more swelling, the longer it will take for the benefits to be seen.   Duration: Steroids will stay in the system x 2 weeks. Duration of benefits will depend on multiple posibilities including persistent irritating factors.  From procedure: Some discomfort is to be expected once the numbing medicine wears off. This should be minimal if ice and heat are applied as instructed. Call if:  You experience numbness and weakness that gets worse with time, as opposed to wearing off.  New onset bowel or bladder incontinence. (Spinal procedures only)  Emergency Numbers:  [place] hours (Monday - Thursday, 8:00 AM - 4:00 PM)  (Friday, 9:00 AM - 12:00 Noon): [phone]  After hours: [phone]   __________________________________________________________________________________________   Post-procedure Information What to expect: Most procedures involve the use of a local anesthetic (numbing medicine), and a steroid (anti-inflammatory medicine).  The local anesthetics may cause temporary numbness and weakness of the legs or arms, depending on the location of the block. This numbness/weakness may last 4-6 hours, depending on the local anesthetic used. In rare instances, it can last up to 24 hours. While numb, you must be very careful not to injure the extremity.  After any procedure, you could expect the pain to get better within 15-20 minutes. This relief is temporary and may last 4-6 hours. Once the local anesthetics wears off, you could experience discomfort, possibly more than usual, for up to 10 (ten) days. In the case of radiofrequencies, it may last up to 6 weeks. Surgeries may take up to 8 weeks for the healing process. The discomfort is due to the irritation caused by needles going through skin and muscle. To minimize the discomfort, we recommend using ice the first day, and heat from then on. The ice should be applied for 15 minutes on, and 15 minutes off. Keep repeating this cycle until bedtime. Avoid applying the ice directly to the skin, to prevent frostbite. Heat should be used daily, until the pain improves (4-10 days). Be careful not to burn yourself.  Occasionally you may experience muscle spasms or cramps. These occur as a consequence of the irritation caused by the needle sticks to the muscle and the blood that will inevitably be lost into the surrounding muscle tissue. Blood tends to be very irritating to tissues, which tend to react by going into spasm. These spasms may start the same day of your procedure, but they may also take days to develop. This late onset type of spasm or cramp is usually  caused by electrolyte imbalances triggered by the steroids, at the level of the kidney. Cramps and spasms tend to respond well to muscle relaxants, multivitamins (some are triggered by the procedure, but may have  their origins in vitamin deficiencies), and "Gatorade", or any sports drinks that can replenish any electrolyte imbalances. (If you are a diabetic, ask your pharmacist to get you a sugar-free brand.) Warm showers or baths may also be helpful. Stretching exercises are highly recommended. General Instructions:  Be alert for signs of possible infection: redness, swelling, heat, red streaks, elevated temperature, and/or fever. These typically appear 4 to 6 days after the procedure. Immediately notify your doctor if you experience unusual bleeding, difficulty breathing, or loss of bowel or bladder control. If you experience increased pain, do not increase your pain medicine intake, unless instructed by your pain physician. Post-Procedure Care:  Be careful in moving about. Muscle spasms in the area of the injection may occur. Applying ice or heat to the area is often helpful. The incidence of spinal headaches after epidural injections ranges between 1.4% and 6%. If you develop a headache that does not seem to respond to conservative therapy, please let your physician know. This can be treated with an epidural blood patch.   Post-procedure numbness or redness is to be expected, however it should average 4 to 6 hours. If numbness and weakness of your extremities begins to develop 4 to 6 hours after your procedure, and is felt to be progressing and worsening, immediately contact your physician.   Diet:  If you experience nausea, do not eat until this sensation goes away. If you had a "Stellate Ganglion Block" for upper extremity "Reflex Sympathetic Dystrophy", do not eat or drink until your hoarseness goes away. In any case, always start with liquids first and if you tolerate them well, then slowly progress  to more solid foods. Activity:  For the first 4 to 6 hours after the procedure, use caution in moving about as you may experience numbness and/or weakness. Use caution in cooking, using household electrical appliances, and climbing steps. If you need to reach your Doctor call our office: [phone] Monday-Thursday 8:00 am - 4:00 PM    Fridays: Closed     In case of an emergency: In case of emergency, call 911 or go to the nearest emergency room and have the physician there call us.  Interpretation of Procedure Every nerve block has two components: a diagnostic component, and a treatment component. Unrealistic expectations are the most common causes of "perceived failure".  In a perfect world, a single nerve block should be able to completely and permanently eliminate the pain. Sadly, the world is not perfect.  Most pain management nerve blocks are performed using local anesthetics and steroids. Steroids are responsible for any long-term benefit that you may experience. Their purpose is to decrease any chronic swelling that may exist in the area. Steroids begin to work immediately after being injected. However, most patients will not experience any benefits until 5 to 10 days after the injection, when the swelling has come down to the point where they can tell a difference. Steroids will only help if there is swelling to be treated. As such, they can assist with the diagnosis. If effective, they suggest an inflammatory component to the pain, and if ineffective, they rule out inflammation as the main cause or component of the problem. If the problem is one of mechanical compression, you will get no benefit from those steroids.   In the case of local anesthetics, they have a crucial role in the diagnosis of your condition. Most will begin to work within15 to 20 minutes after injection. The duration will depend on the type  used (short- vs. Long-acting). It is of outmost importance that patients keep  tract of their pain, after the procedure. To assist with this matter, a "Post-procedure Pain Diary" is provided. Make sure to complete it and to bring it back to your follow-up appointment.  As long as the patient keeps accurate, detailed records of their symptoms after every procedure, and returns to have those interpreted, every procedure will provide [place] with invaluable information. Even a block that does not provide the patient with any relief, will always provide [place] with information about the mechanism and the origin of the pain. The only time a nerve block can be considered a waste of time is when patients do not keep track of the results, or do not keep their post-procedure appointment.  Reporting the results back to your physician The Pain Score  Pain is a subjective complaint. It cannot be seen, touched, or measured. We depend entirely on the patient's report of the pain in order to assess your condition and treatment. To evaluate the pain, we use a pain scale, where "0" means "No Pain", and a "10" is "the worst possible pain that you can even imagine" (i.e. something like been eaten alive by a shark or being torn apart by a lion).   You will frequently be asked to rate your pain. Please be as accurate, remember that medical decisions will be based on your responses. Please do not rate your pain above a 10. Doing so is actually interpreted as "symptom magnification" (exaggeration), as well as lack of understanding with regards to the scale. To put this into perspective, when you tell us that your pain is at a 10 (ten), what you are saying is that there is nothing we can do to make this pain any worse. (Carefully think about that.) Post-Procedure Pain Diary   Name: Date of Service Procedure      Time Period Pain Score Painful Area  Pre-procedure ____/10   Time Period Pain Score Area improved. Area not improved.  15 to 30 min post-procedure ____/10    1st hour after procedure ____/10    2nd  hour after procedure ____/10    3rd hour after procedure ____/10    4th hour after procedure ____/10    5th hour after procedure ____/10    6th hour after procedure ____/10    7th hour after procedure ____/10    Time Period Pain Score Area improved. Area not improved.  Note: From here on, always document your pain score 1st thing in the morning.  1st day after procedure ____/10    2nd day after procedure ____/10    3rd day after procedure ____/10    4th day after procedure ____/10    5th day after procedure ____/10    Time Period Pain Score Area improved. Area not improved.  10th day after procedure ____/10    20th day after procedure ____/10     Benefits Indicate for each set of activities if the procedure changes your ability to accomplish them.  Activity Worse No-Change Improved  Dressing, eating, walking, toileting, hygiene     Shopping, housekeeping, food preparation, community transportation     Range of motion of affected area      Your opinion Please indicate which statement best describes your impression of this treatment.  Statement (X)  Based on the results, I am encouraged.   Based on the results, I am disappointed.   I am not sure I have an opinion at  this point.    Note: Make sure to complete and return this form to your physician, on your follow-up appointment. This information will be used to interpret the results. Failure to accurately complete, or to return this information, may result in less than optimal outcomes.

## 2016-06-19 NOTE — Progress Notes (Signed)
Safety precautions to be maintained throughout the outpatient stay will include: orient to surroundings, keep bed in low position, maintain call bell within reach at all times, provide assistance with transfer out of bed and ambulation. Patient is on Prednisone for rash that itches and hurts.  Patient states that they do not think it is shingles.  Has one more dose of Prednisone.  Has taken 4 days so far.  Dr Dossie Arbour notified.

## 2016-06-20 ENCOUNTER — Telehealth: Payer: Self-pay | Admitting: *Deleted

## 2016-06-20 NOTE — Telephone Encounter (Signed)
Denies problems post procedure. 

## 2016-07-11 ENCOUNTER — Ambulatory Visit: Payer: Medicare Other | Admitting: Nurse Practitioner

## 2016-07-13 ENCOUNTER — Encounter: Payer: Self-pay | Admitting: Nurse Practitioner

## 2016-07-13 ENCOUNTER — Telehealth: Payer: Self-pay | Admitting: *Deleted

## 2016-07-13 ENCOUNTER — Ambulatory Visit: Payer: Medicare Other | Attending: Nurse Practitioner | Admitting: Nurse Practitioner

## 2016-07-13 VITALS — BP 122/76 | HR 64 | Temp 97.7°F | Resp 16 | Ht 64.0 in | Wt 150.0 lb

## 2016-07-13 DIAGNOSIS — Z79891 Long term (current) use of opiate analgesic: Secondary | ICD-10-CM

## 2016-07-13 DIAGNOSIS — R51 Headache: Secondary | ICD-10-CM | POA: Insufficient documentation

## 2016-07-13 DIAGNOSIS — Z79899 Other long term (current) drug therapy: Secondary | ICD-10-CM | POA: Diagnosis not present

## 2016-07-13 DIAGNOSIS — I10 Essential (primary) hypertension: Secondary | ICD-10-CM | POA: Diagnosis not present

## 2016-07-13 DIAGNOSIS — Z9049 Acquired absence of other specified parts of digestive tract: Secondary | ICD-10-CM | POA: Diagnosis not present

## 2016-07-13 DIAGNOSIS — Z90710 Acquired absence of both cervix and uterus: Secondary | ICD-10-CM | POA: Insufficient documentation

## 2016-07-13 DIAGNOSIS — Z96641 Presence of right artificial hip joint: Secondary | ICD-10-CM | POA: Insufficient documentation

## 2016-07-13 DIAGNOSIS — M47812 Spondylosis without myelopathy or radiculopathy, cervical region: Secondary | ICD-10-CM

## 2016-07-13 DIAGNOSIS — M4692 Unspecified inflammatory spondylopathy, cervical region: Secondary | ICD-10-CM | POA: Diagnosis not present

## 2016-07-13 DIAGNOSIS — M25512 Pain in left shoulder: Secondary | ICD-10-CM | POA: Diagnosis not present

## 2016-07-13 DIAGNOSIS — Z91013 Allergy to seafood: Secondary | ICD-10-CM | POA: Diagnosis not present

## 2016-07-13 DIAGNOSIS — Z9889 Other specified postprocedural states: Secondary | ICD-10-CM | POA: Diagnosis not present

## 2016-07-13 DIAGNOSIS — R42 Dizziness and giddiness: Secondary | ICD-10-CM | POA: Diagnosis not present

## 2016-07-13 DIAGNOSIS — Z885 Allergy status to narcotic agent status: Secondary | ICD-10-CM | POA: Diagnosis not present

## 2016-07-13 DIAGNOSIS — M545 Low back pain: Secondary | ICD-10-CM | POA: Diagnosis not present

## 2016-07-13 DIAGNOSIS — M1610 Unilateral primary osteoarthritis, unspecified hip: Secondary | ICD-10-CM | POA: Diagnosis not present

## 2016-07-13 DIAGNOSIS — M25511 Pain in right shoulder: Secondary | ICD-10-CM | POA: Diagnosis not present

## 2016-07-13 DIAGNOSIS — K589 Irritable bowel syndrome without diarrhea: Secondary | ICD-10-CM | POA: Insufficient documentation

## 2016-07-13 DIAGNOSIS — G894 Chronic pain syndrome: Secondary | ICD-10-CM | POA: Diagnosis not present

## 2016-07-13 DIAGNOSIS — M21371 Foot drop, right foot: Secondary | ICD-10-CM | POA: Insufficient documentation

## 2016-07-13 DIAGNOSIS — Z880 Allergy status to penicillin: Secondary | ICD-10-CM | POA: Diagnosis not present

## 2016-07-13 DIAGNOSIS — G8929 Other chronic pain: Secondary | ICD-10-CM | POA: Diagnosis not present

## 2016-07-13 DIAGNOSIS — E871 Hypo-osmolality and hyponatremia: Secondary | ICD-10-CM | POA: Insufficient documentation

## 2016-07-13 DIAGNOSIS — F329 Major depressive disorder, single episode, unspecified: Secondary | ICD-10-CM | POA: Diagnosis not present

## 2016-07-13 DIAGNOSIS — Z87442 Personal history of urinary calculi: Secondary | ICD-10-CM | POA: Diagnosis not present

## 2016-07-13 DIAGNOSIS — J45909 Unspecified asthma, uncomplicated: Secondary | ICD-10-CM | POA: Diagnosis not present

## 2016-07-13 DIAGNOSIS — Z888 Allergy status to other drugs, medicaments and biological substances status: Secondary | ICD-10-CM | POA: Insufficient documentation

## 2016-07-13 MED ORDER — OXYCODONE HCL 10 MG PO TABS: 10.0000 mg | ORAL_TABLET | Freq: Four times a day (QID) | ORAL | 0 refills | Status: DC | PRN

## 2016-07-13 NOTE — Telephone Encounter (Signed)
I dont see that the patient has an RF ordered. I have not tried to get authorization for it.

## 2016-07-13 NOTE — Progress Notes (Addendum)
Patient's Name: [name]  MRN: [id number]  Referring Provider: Jerrol Banana.,*  DOB: 10/23/1960  PCP: Virginia Crews, MD  DOS: 07/13/2016  Note by: Vevelyn Francois NP  Service setting: Ambulatory outpatient  Specialty: Interventional Pain Management  Location: [facility] (AMB) Pain Management Facility    Patient type: Established    Primary Reason(s) for Visit: Encounter for prescription drug management (Level of risk: moderate) CC: Shoulder Pain (bilateral, left is worse)  HPI  [name] is a 56 y.o. year old, female patient, who comes today for a medication management evaluation. She has Chronic neck pain; Anxiety state; Asthma, mild; Blurred vision; Benign paroxysmal positional nystagmus; Congenital renal agenesis and dysgenesis; Clinical depression; Colon, diverticulosis; Fatigue; Cannot sleep; IBS (irritable bowel syndrome); Awareness of heartbeats; RAD (reactive airway disease); Apnea, sleep; Esophagitis, reflux; DOE (dyspnea on exertion); Chronic left shoulder pain; Chronic right hip pain; Encounter for therapeutic drug level monitoring; Long term current use of opiate analgesic; Long term prescription opiate use; Opiate use (60 MME/Day); Chronic pain syndrome; Steroid intolerance; Cervical spine pain; Cervical spondylosis; Cervical facet arthropathy (bilateral); History of cervical spinal surgery; Chronic shoulder pain (Secondary source of pain) (Bilateral) (L>R); 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 Her primarily concern today is the Shoulder Pain (bilateral, left is worse)  Pain Assessment: Self-Reported Pain Score: 3 /10             Reported level is compatible with observation.       Pain Type: Chronic pain Pain Location: Shoulder Pain Orientation: Right, Left (left is worse) Pain Descriptors / Indicators: Stabbing, Sharp, Other (Comment) (popping and cracking in the left) Pain Frequency: Constant  [name] was last scheduled for an appointment on 06/14/16 for medication management. During today's appointment we reviewed [name] chronic pain status, as well as her outpatient medication regimen.  The patient  reports that she does not use drugs. Her body mass index is 25.75 kg/m.  Further details on both, my assessment(s), as well as the proposed treatment plan, please see below.  Controlled Substance Pharmacotherapy Assessment REMS (Risk Evaluation and Mitigation Strategy)  Analgesic:Oxycodone IR 10 mg every 6 hours (40 mg/day) MME/day:60 mg/day  No notes on file Pharmacokinetics: Liberation and absorption (onset of action): WNL Distribution (time to peak effect): WNL Metabolism and excretion (duration of action): WNL         Pharmacodynamics: Desired effects: Analgesia: [name] reports >50% benefit. Functional ability: Patient reports that medication allows her to accomplish basic ADLs Clinically meaningful improvement in function (CMIF): Sustained CMIF goals met Perceived effectiveness: Described as relatively effective, allowing for increase in activities of daily living (ADL) Undesirable effects: Side-effects or Adverse reactions: None reported Monitoring: [place] PMP: Online review of the past 1-monthperiod conducted. Compliant with practice  rules and regulations List of all UDS test(s) done:  Lab Results  Component Value Date   TOXASSSELUR FINAL 10/07/2015   TRamirenoFINAL 07/02/2015  Ventana FINAL 04/05/2015   SUMMARY FINAL 07/13/2016   Last UDS on record: ToxAssure Select 13  Date Value Ref Range Status  10/07/2015 FINAL  Final    Comment:    ==================================================================== TOXASSURE SELECT 13 (MW) ==================================================================== Test                             Result       Flag       Units Drug Present and Declared for Prescription Verification   Alprazolam                     340          EXPECTED   ng/mg creat   Alpha-hydroxyalprazolam        388          EXPECTED   ng/mg creat    Source of alprazolam is a scheduled prescription medication.    Alpha-hydroxyalprazolam is an expected metabolite of alprazolam.   Oxycodone                      5844         EXPECTED   ng/mg creat   Oxymorphone                    232          EXPECTED   ng/mg creat   Noroxycodone                   14540        EXPECTED   ng/mg creat    Sources of oxycodone include scheduled prescription medications.    Oxymorphone and noroxycodone are expected metabolites of    oxycodone. Oxymorphone is also available as a scheduled    prescription medication. ==================================================================== Test                      Result    Flag   Units      Ref Range   Creatinine              25               mg/dL      >=20 ==================================================================== Declared Medications:  The flagging and interpretation on this report are based on the  following declared medications.  Unexpected results may arise from  inaccuracies in the declared medications.  **Note: The testing scope of this panel includes these medications:  Alprazolam (Xanax)  Oxycodone  **Note: The testing scope of this panel does not include  following  reported medications:  Albuterol (Proventil)  Bupropion (Wellbutrin)  Fluticasone (Advair)  Fluticasone (Flonase)  Linaclotide (Linzess)  Loratadine (Claritin)  Metoprolol  Montelukast (Singulair)  Omeprazole  Salmeterol (Advair)  Valacyclovir (Valtrex) ==================================================================== For clinical consultation, please call [phone]. ====================================================================    UDS interpretation: Compliant          Medication Assessment Form: Reviewed. Patient indicates being compliant with therapy Treatment compliance: Compliant Risk Assessment Profile: Aberrant behavior: See prior evaluations. None observed or detected today Comorbid factors increasing risk of overdose: See prior notes. No additional risks detected today Risk of substance use disorder (SUD): Low Opioid Risk Tool (ORT) Total Score: 4  Interpretation Table:  Score <3 = Low Risk for SUD  Score between 4-7 = Moderate Risk for SUD  Score >8 = High Risk for Opioid Abuse  Risk Mitigation Strategies:  Patient Counseling: Covered Patient-Prescriber Agreement (PPA): Present and active  Notification to other healthcare providers: Done  Pharmacologic Plan: No change in therapy, at this time  Laboratory Chemistry  Inflammation Markers Lab Results  Component Value Date   CRP 0.5 04/05/2015   ESRSEDRATE 37 (H) 04/05/2015   (CRP: Acute Phase) (ESR: Chronic Phase) Renal Function Markers Lab Results  Component Value Date   BUN 11 12/07/2016   CREATININE 0.85 12/07/2016   GFRAA 89 01/27/2016   GFRNONAA 77 01/27/2016   Hepatic Function Markers Lab Results  Component Value Date   AST 15 12/07/2016   ALT 9 12/07/2016   ALBUMIN 4.3 01/27/2016   ALKPHOS 118 (H) 01/27/2016   Electrolytes Lab Results  Component Value Date   NA 139 12/07/2016   K 4.4 12/07/2016   CL 102 12/07/2016   CALCIUM 9.5 12/07/2016   MG 2.1 04/05/2015    Neuropathy Markers No results found for: [id number] Bone Pathology Markers Lab Results  Component Value Date   ALKPHOS 118 (H) 01/27/2016   CALCIUM 9.5 12/07/2016   Coagulation Parameters Lab Results  Component Value Date   INR 1.05 03/06/2015   LABPROT 13.9 03/06/2015   APTT 33 03/06/2015   PLT 381 12/07/2016   Cardiovascular Markers Lab Results  Component Value Date   HGB 12.3 12/07/2016   HCT 37.5 12/07/2016   Note: Lab results reviewed.  Recent Diagnostic Imaging Review  Dg C-arm 1-60 Min-no Report  Result Date: 06/19/2016 Fluoroscopy was utilized by the requesting physician.  No radiographic interpretation.   Note: Imaging results reviewed.          Meds  The patient has a current medication list which includes the following prescription(s): albuterol, fluticasone, fluticasone-salmeterol, loratadine, ondansetron, aerochamber mv, valacyclovir, alprazolam, bupropion, escitalopram, metoprolol succinate, montelukast, and omeprazole.  Current Outpatient Prescriptions on File Prior to Visit  Medication Sig  . albuterol (PROVENTIL HFA;VENTOLIN HFA) 108 (90 BASE) MCG/ACT inhaler Inhale 2 puffs into the lungs every 6 (six) hours as needed for wheezing.  . fluticasone (FLONASE) 50 MCG/ACT nasal spray Place 2 sprays into both nostrils daily. (Patient taking differently: Place 2 sprays into both nostrils daily as needed. )  . Fluticasone-Salmeterol (ADVAIR DISKUS) 500-50 MCG/DOSE AEPB Inhale 1 puff into the lungs 2 (two) times daily. (Patient taking differently: Inhale 1 puff into the lungs 2 (two) times daily. I puff in the morning and 1 puff at night)  . loratadine (CLARITIN REDITABS) 10 MG dissolvable tablet Take 1 tablet (10 mg total) by mouth daily.  . ondansetron (ZOFRAN) 4 MG tablet Take 1 tablet (4 mg total) by mouth every 8 (eight) hours as needed for nausea or vomiting.  Marland Kitchen Spacer/Aero-Holding Chambers (AEROCHAMBER MV) inhaler by Does not apply route.  . valACYclovir  (VALTREX) 1000 MG tablet TAKE TWO TABLETS BY MOUTH TWICE DAILY FOR ONE DAY FEVER BLISTER   No current facility-administered medications on file prior to visit.    ROS  Constitutional: Denies any fever or chills Gastrointestinal: No reported hemesis, hematochezia, vomiting, or acute GI distress Musculoskeletal: Denies any acute onset joint swelling, redness, loss of ROM, or weakness Neurological: No reported episodes of acute onset apraxia, aphasia, dysarthria, agnosia, amnesia, paralysis, loss of coordination, or loss of consciousness  Allergies  [name] is allergic to amoxicillin; clindamycin; codeine; sulfa antibiotics; shellfish allergy; decadron [dexamethasone]; papaya derivatives; prednisone; betadine [povidone iodine]; chlorhexidine; clarithromycin; clindamycin/lincomycin; and other.  [place]  Drug: Ms. Pridgen  reports that she does not use  drugs. Alcohol:  reports that she does not drink alcohol. Tobacco:  reports that she has never smoked. She has never used smokeless tobacco. 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); Anemia; Anxiety; Asthma; Cephalalgia (08/18/2014); Cervical disc disease (08/18/2014); Chronic headaches; DDD (degenerative disc disease); Depression; Dizziness (04/22/2013); Duodenal ulcer with hemorrhage and perforation ([place]) (04/27/2003); GERD (gastroesophageal reflux disease); H/O arthrodesis (08/18/2014); Headache(784.0); History of blood transfusion; History of cervical spinal surgery (01/04/2015); History of kidney stones; Hypertension; Inverted T wave; Narrowing of intervertebral disc space (08/18/2014); Orthostatic hypotension (04/22/2013); Pain; Pneumonia; PONV (postoperative nausea and vomiting); Postop Hyponatremia (05/14/2012); Postoperative anemia due to acute blood loss (05/14/2012); Right foot drop; and Right hip arthralgia (08/18/2014). Family: family history includes Aneurysm in her maternal grandmother and mother; Breast cancer (age of  onset: 59) in her paternal grandmother.  Past Surgical History:  Procedure Laterality Date  . ABDOMINAL HYSTERECTOMY    . ANTERIOR CERVICAL DECOMP/DISCECTOMY FUSION N/A 10/30/2012   Procedure: ACDF C5-6, EXPLORATION AND HARDWARE REMOVAL C6-7;  Surgeon: Melina Schools, MD;  Location: [place];  Service: Orthopedics;  Laterality: N/A;  . ANTERIOR FUSION CERVICAL SPINE  MAY 2012   AT [facility]  . BACK SURGERY  2009   LUMBAR FUSION WITH RODS   . BREAST BIOPSY Right 2008   benign.- bx/clip  . BREAST EXCISIONAL BIOPSY Left 1998   benign  . BREAST REDUCTION SURGERY Bilateral 06/2016  . CARPAL TUNNEL RELEASE  05-06-12   Right  . CHOLECYSTECTOMY    . DIAGNOSTIC LAPAROSCOPIES - MULTIPLE FOR ENDOMETRIOSIS    . HIP ARTHROSCOPY  09/20/2011   Procedure: ARTHROSCOPY HIP;  Surgeon: Gearlean Alf, MD;  Location: [facility];  Service: Orthopedics;  Laterality: Right;  Right Hip Scope with Labral Debridement  . NASAL SEPTUM SURGERY  MARCH 2013   IN [place]  . POSTERIOR CERVICAL FUSION/FORAMINOTOMY N/A 04/16/2013   Procedure: REMOVAL CERVICAL PLATES AND INTERBODY CAGE/POSTERIOR CERVICAL SPINAL FUSION C4 - C6/C5 CORPECTOMY/C4 - C6 FUSION WITH ILIAC CREST BONE GRAFT;  Surgeon: Melina Schools, MD;  Location: [place];  Service: Orthopedics;  Laterality: N/A;  . RIGHT HIP ARTHROSCOPY FOR LABRAL TEAR  ABOUT 2010   2012 also  . SHOULDER ARTHROSCOPY  05-06-12   bone spur  . TONSILLECTOMY     AS A CHILD  . TOTAL HIP ARTHROPLASTY Right 05/13/2012   Procedure: TOTAL HIP ARTHROPLASTY ANTERIOR APPROACH;  Surgeon: Gearlean Alf, MD;  Location: [facility];  Service: Orthopedics;  Laterality: Right;   Constitutional Exam  General appearance: Well nourished, well developed, and well hydrated. In no apparent acute distress Vitals:   07/13/16 1006  BP: 122/76  Pulse: 64  Resp: 16  Temp: 97.7 F (36.5 C)  TempSrc: Oral  SpO2: 100%  Weight: 150 lb (68 kg)  Height: _0  (1.626 m)   BMI Assessment: Estimated body mass index is  25.75 kg/m as calculated from the following:   Height as of this encounter: _1  (1.626 m).   Weight as of this encounter: 150 lb (68 kg).  BMI interpretation table: BMI level Category Range association with higher incidence of chronic pain  <18 kg/m2 Underweight   18.5-24.9 kg/m2 Ideal body weight   25-29.9 kg/m2 Overweight Increased incidence by 20%  30-34.9 kg/m2 Obese (Class I) Increased incidence by 68%  35-39.9 kg/m2 Severe obesity (Class II) Increased incidence by 136%  >40 kg/m2 Extreme obesity (Class III) Increased incidence by 254%   BMI Readings from Last 4 Encounters:  12/07/16 26.61 kg/m  12/07/16 26.64 kg/m  10/27/16 26.09 kg/m  10/11/16 24.03 kg/m   Wt Readings from Last 4 Encounters:  12/07/16 155 lb (70.3 kg)  12/07/16 155 lb 3.2 oz (70.4 kg)  10/27/16 152 lb (68.9 kg)  10/11/16 140 lb (63.5 kg)  Psych/Mental status: Alert, oriented x 3 (person, place, & time)       Eyes: PERLA Respiratory: No evidence of acute respiratory distress  Cervical Spine Exam  Inspection: No masses, redness, or swelling Alignment: Symmetrical Functional ROM: Unrestricted ROM      Stability: No instability detected Muscle strength & Tone: Functionally intact Sensory: Unimpaired Palpation: No palpable anomalies              Upper Extremity (UE) Exam    Side: Right upper extremity  Side: Left upper extremity  Inspection: No masses, redness, swelling, or asymmetry. No contractures  Inspection: No masses, redness, swelling, or asymmetry. No contractures  Functional ROM: Unrestricted ROM          Functional ROM: Unrestricted ROM          Muscle strength & Tone: Functionally intact  Muscle strength & Tone: Functionally intact  Sensory: Unimpaired  Sensory: Unimpaired  Palpation: No palpable anomalies              Palpation: No palpable anomalies              Specialized Test(s): Deferred         Specialized Test(s): Deferred          Thoracic Spine Exam  Inspection: No masses,  redness, or swelling Alignment: Symmetrical Functional ROM: Unrestricted ROM Stability: No instability detected Sensory: Unimpaired Muscle strength & Tone: No palpable anomalies  Lumbar Spine Exam  Inspection: No masses, redness, or swelling Alignment: Symmetrical Functional ROM: Unrestricted ROM      Stability: No instability detected Muscle strength & Tone: Functionally intact Sensory: Unimpaired Palpation: No palpable anomalies       Provocative Tests: Lumbar Hyperextension and rotation test: evaluation deferred today       Patrick's Maneuver: evaluation deferred today                    Gait & Posture Assessment  Ambulation: Unassisted Gait: Relatively normal for age and body habitus Posture: WNL   Lower Extremity Exam    Side: Right lower extremity  Side: Left lower extremity  Inspection: No masses, redness, swelling, or asymmetry. No contractures  Inspection: No masses, redness, swelling, or asymmetry. No contractures  Functional ROM: Unrestricted ROM          Functional ROM: Unrestricted ROM          Muscle strength & Tone: Functionally intact  Muscle strength & Tone: Functionally intact  Sensory: Unimpaired  Sensory: Unimpaired  Palpation: No palpable anomalies  Palpation: No palpable anomalies   Assessment  Primary Diagnosis & Pertinent Problem List: The primary encounter diagnosis was Chronic shoulder pain (Location of Secondary source of pain) (Bilateral) (L>R). Diagnoses of Cervical facet syndrome (bilateral), Cervical facet arthropathy (bilateral), Chronic pain syndrome, and Long term current use of opiate analgesic were also pertinent to this visit.  Status Diagnosis  Controlled Controlled Controlled 1. Chronic shoulder pain (Location of Secondary source of pain) (Bilateral) (L>R)   2. Cervical facet syndrome (bilateral)   3. Cervical facet arthropathy (bilateral)   4. Chronic pain syndrome   5. Long term current use of opiate analgesic      Plan of Care  Pharmacotherapy (Medications Ordered): Meds ordered this encounter  Medications  . DISCONTD: Oxycodone HCl 10 MG TABS    Sig: Take 1 tablet (10 mg total) by mouth every 6 (six) hours as needed.    Dispense:  120 tablet    Refill:  0    Do not place this medication, or any other prescription from our practice, on "Automatic Refill". Patient may have prescription filled one day early if pharmacy is closed on scheduled refill date. Do not fill until: 08/12/16 To last until: 09/11/16    Order Specific Question:   Supervising Provider    Answer:   Milinda Pointer [phone]  . DISCONTD: Oxycodone HCl 10 MG TABS    Sig: Take 1 tablet (10 mg total) by mouth every 6 (six) hours as needed.    Dispense:  120 tablet    Refill:  0    Do not place this medication, or any other prescription from our practice, on "Automatic Refill". Patient may have prescription filled one day early if pharmacy is closed on scheduled refill date. Do not fill until: 09/11/16 To last until: 10/11/16    Order Specific Question:   Supervising Provider    Answer:   Milinda Pointer [phone]  . DISCONTD: Oxycodone HCl 10 MG TABS    Sig: Take 1 tablet (10 mg total) by mouth every 6 (six) hours as needed.    Dispense:  120 tablet    Refill:  0    Do not place this medication, or any other prescription from our practice, on "Automatic Refill". Patient may have prescription filled one day early if pharmacy is closed on scheduled refill date. Do not fill until: 07/13/16 To last until: 08/12/16    Order Specific Question:   Supervising Provider    Answer:   Milinda Pointer [phone]   New Prescriptions   No medications on file   Medications administered today: [name] had no medications administered during this visit. Lab-work, procedure(s), and/or referral(s): Orders Placed This Encounter  Procedures  . Radiofrequency Shoulder Joint  . ToxASSURE Select 13 (MW), Urine   Imaging and/or  referral(s): None  Interventional therapies: Planned, scheduled, and/or pending:   Not at this time.   Considering:   Diagnostic bilateral suprascapular nerve block   Palliative PRN treatment(s):   Diagnostic bilateral suprascapular nerve block     Provider-requested follow-up: Return in about 3 months (around 10/13/2016) for Medication Mgmt.  Future Appointments Date Time Provider [place]  12/13/2016 9:45 AM Milinda Pointer, MD [facility] None  01/04/2017 10:30 AM Milinda Pointer, MD [facility] None  01/10/2017 9:30 AM Lucilla Lame, MD [facility] None  01/19/2017 4:00 PM Virginia Crews, MD [facility] None  12/10/2017 9:30 AM [facility]NURSE HEALTH ADVISOR [facility] None  12/10/2017 10:00 AM Bacigalupo, Dionne Bucy, MD [facility] None   Primary Care Physician: Virginia Crews, MD Location: [facility] Note by: Vevelyn Francois NP Date: 07/13/2016; Time: 2:37 PM  Pain Score Disclaimer: We use the NRS-11 scale. This is a self-reported, subjective measurement of pain severity with only modest accuracy. It is used primarily to identify changes within a particular patient. It must be understood that outpatient pain scales are significantly less accurate that those used for research, where they can be applied under ideal controlled circumstances with minimal exposure to variables. In reality, the score is likely to be a combination of pain intensity and pain affect, where pain affect describes the degree of emotional arousal or changes in action readiness  caused by the sensory experience of pain. Factors such as social and work situation, setting, emotional state, anxiety levels, expectation, and prior pain experience may influence pain perception and show large inter-individual differences that may also be affected by time variables.  Patient instructions provided during this appointment: There are no Patient Instructions on file for this visit.

## 2016-07-19 LAB — TOXASSURE SELECT 13 (MW), URINE

## 2016-07-20 DIAGNOSIS — R1310 Dysphagia, unspecified: Secondary | ICD-10-CM | POA: Diagnosis not present

## 2016-07-20 DIAGNOSIS — M549 Dorsalgia, unspecified: Secondary | ICD-10-CM | POA: Diagnosis not present

## 2016-07-20 DIAGNOSIS — N62 Hypertrophy of breast: Secondary | ICD-10-CM | POA: Diagnosis not present

## 2016-07-20 DIAGNOSIS — Z888 Allergy status to other drugs, medicaments and biological substances status: Secondary | ICD-10-CM | POA: Diagnosis not present

## 2016-07-20 DIAGNOSIS — Z981 Arthrodesis status: Secondary | ICD-10-CM | POA: Diagnosis not present

## 2016-07-20 DIAGNOSIS — Z882 Allergy status to sulfonamides status: Secondary | ICD-10-CM | POA: Diagnosis not present

## 2016-07-20 DIAGNOSIS — M1990 Unspecified osteoarthritis, unspecified site: Secondary | ICD-10-CM | POA: Diagnosis not present

## 2016-07-20 DIAGNOSIS — Z96649 Presence of unspecified artificial hip joint: Secondary | ICD-10-CM | POA: Diagnosis not present

## 2016-07-20 DIAGNOSIS — Z885 Allergy status to narcotic agent status: Secondary | ICD-10-CM | POA: Diagnosis not present

## 2016-07-20 DIAGNOSIS — D649 Anemia, unspecified: Secondary | ICD-10-CM | POA: Diagnosis not present

## 2016-07-20 DIAGNOSIS — Z880 Allergy status to penicillin: Secondary | ICD-10-CM | POA: Diagnosis not present

## 2016-07-20 DIAGNOSIS — M5136 Other intervertebral disc degeneration, lumbar region: Secondary | ICD-10-CM | POA: Diagnosis not present

## 2016-07-20 DIAGNOSIS — N6029 Fibroadenosis of unspecified breast: Secondary | ICD-10-CM | POA: Diagnosis not present

## 2016-07-20 DIAGNOSIS — Z881 Allergy status to other antibiotic agents status: Secondary | ICD-10-CM | POA: Diagnosis not present

## 2016-07-20 DIAGNOSIS — N6022 Fibroadenosis of left breast: Secondary | ICD-10-CM | POA: Diagnosis not present

## 2016-07-20 DIAGNOSIS — K219 Gastro-esophageal reflux disease without esophagitis: Secondary | ICD-10-CM | POA: Diagnosis not present

## 2016-07-20 DIAGNOSIS — Z91013 Allergy to seafood: Secondary | ICD-10-CM | POA: Diagnosis not present

## 2016-07-20 DIAGNOSIS — M542 Cervicalgia: Secondary | ICD-10-CM | POA: Diagnosis not present

## 2016-07-20 DIAGNOSIS — N6021 Fibroadenosis of right breast: Secondary | ICD-10-CM | POA: Diagnosis not present

## 2016-07-20 DIAGNOSIS — J45909 Unspecified asthma, uncomplicated: Secondary | ICD-10-CM | POA: Diagnosis not present

## 2016-07-20 DIAGNOSIS — M5134 Other intervertebral disc degeneration, thoracic region: Secondary | ICD-10-CM | POA: Diagnosis not present

## 2016-07-20 DIAGNOSIS — G8929 Other chronic pain: Secondary | ICD-10-CM | POA: Diagnosis not present

## 2016-07-20 DIAGNOSIS — Z9889 Other specified postprocedural states: Secondary | ICD-10-CM | POA: Diagnosis not present

## 2016-07-20 DIAGNOSIS — K5790 Diverticulosis of intestine, part unspecified, without perforation or abscess without bleeding: Secondary | ICD-10-CM | POA: Diagnosis not present

## 2016-08-10 ENCOUNTER — Other Ambulatory Visit: Payer: Self-pay | Admitting: Physician Assistant

## 2016-08-10 DIAGNOSIS — K219 Gastro-esophageal reflux disease without esophagitis: Secondary | ICD-10-CM

## 2016-08-18 ENCOUNTER — Encounter: Payer: Self-pay | Admitting: Physician Assistant

## 2016-08-25 ENCOUNTER — Ambulatory Visit (INDEPENDENT_AMBULATORY_CARE_PROVIDER_SITE_OTHER): Payer: Medicare Other | Admitting: Family Medicine

## 2016-08-25 ENCOUNTER — Encounter: Payer: Self-pay | Admitting: Family Medicine

## 2016-08-25 VITALS — BP 122/84 | HR 68 | Temp 98.5°F | Resp 16 | Wt 148.0 lb

## 2016-08-25 DIAGNOSIS — N3090 Cystitis, unspecified without hematuria: Secondary | ICD-10-CM | POA: Diagnosis not present

## 2016-08-25 LAB — POCT URINALYSIS DIPSTICK
GLUCOSE UA: NEGATIVE
KETONES UA: 40
SPEC GRAV UA: 1.02 (ref 1.010–1.025)
Urobilinogen, UA: >=8 E.U./dL — AB
pH, UA: 8.5 — AB (ref 5.0–8.0)

## 2016-08-25 MED ORDER — NITROFURANTOIN MONOHYD MACRO 100 MG PO CAPS: 100.0000 mg | ORAL_CAPSULE | Freq: Two times a day (BID) | ORAL | 0 refills | Status: DC

## 2016-08-25 NOTE — Progress Notes (Signed)
Subjective:     Patient ID: [name], female   DOB: 1960-08-22, 56 y.o.   MRN: [id number]  HPI  Chief Complaint  Patient presents with  . Urinary Tract Infection    Patient comes in office today with concerns of a urinary tract infection that began 4 days ago. Patient states that the day before symptoms began she was out fishing wither her soiuse. Patient reports difficulty voiding, dysuria, fever, frequency and body chills. Patient reports that she does have a history of kidney stones but denies seeing blood in urine  States she last had a UTI two years ago.   Review of Systems     Objective:   Physical Exam  Constitutional: She appears well-developed and well-nourished. No distress.  Genitourinary:  Genitourinary Comments: No c.v.a. tenderness       Assessment:    1. Cystitis - POCT urinalysis dipstick - Urine Culture - nitrofurantoin, macrocrystal-monohydrate, (MACROBID) 100 MG capsule; Take 1 capsule (100 mg total) by mouth 2 (two) times daily.  Dispense: 14 capsule; Refill: 0    Plan:    Further f/u pending urine culture.

## 2016-08-25 NOTE — Patient Instructions (Signed)
We will cal you with the urine culture results. May AZO or Uristat over the counter.

## 2016-08-27 LAB — URINE CULTURE

## 2016-08-28 ENCOUNTER — Other Ambulatory Visit: Payer: Self-pay | Admitting: Family Medicine

## 2016-08-28 ENCOUNTER — Ambulatory Visit
Admission: RE | Admit: 2016-08-28 | Discharge: 2016-08-28 | Disposition: A | Discharge status: Home / Self Care | Payer: Medicare Other | Source: Ambulatory Visit | Attending: Family Medicine | Admitting: Family Medicine

## 2016-08-28 DIAGNOSIS — Z96641 Presence of right artificial hip joint: Secondary | ICD-10-CM | POA: Insufficient documentation

## 2016-08-28 DIAGNOSIS — R319 Hematuria, unspecified: Secondary | ICD-10-CM | POA: Diagnosis not present

## 2016-08-28 DIAGNOSIS — N261 Atrophy of kidney (terminal): Secondary | ICD-10-CM | POA: Insufficient documentation

## 2016-08-28 DIAGNOSIS — Z981 Arthrodesis status: Secondary | ICD-10-CM | POA: Insufficient documentation

## 2016-08-28 DIAGNOSIS — R109 Unspecified abdominal pain: Secondary | ICD-10-CM | POA: Diagnosis not present

## 2016-08-29 NOTE — Progress Notes (Signed)
Advised  ED 

## 2016-09-07 ENCOUNTER — Other Ambulatory Visit: Payer: Self-pay | Admitting: Physician Assistant

## 2016-09-25 ENCOUNTER — Other Ambulatory Visit: Payer: Self-pay | Admitting: Physician Assistant

## 2016-09-25 DIAGNOSIS — F411 Generalized anxiety disorder: Secondary | ICD-10-CM

## 2016-09-26 ENCOUNTER — Encounter: Payer: Medicare Other | Admitting: Nurse Practitioner

## 2016-09-26 NOTE — Telephone Encounter (Signed)
Called in.

## 2016-10-02 ENCOUNTER — Encounter: Payer: Medicare Other | Admitting: Nurse Practitioner

## 2016-10-11 ENCOUNTER — Encounter: Payer: Self-pay | Admitting: Pain Medicine

## 2016-10-11 ENCOUNTER — Ambulatory Visit: Payer: Medicare Other | Attending: Pain Medicine | Admitting: Pain Medicine

## 2016-10-11 VITALS — BP 138/71 | HR 70 | Temp 98.4°F | Resp 16 | Ht 64.0 in | Wt 140.0 lb

## 2016-10-11 DIAGNOSIS — G8929 Other chronic pain: Secondary | ICD-10-CM | POA: Insufficient documentation

## 2016-10-11 DIAGNOSIS — G894 Chronic pain syndrome: Secondary | ICD-10-CM | POA: Diagnosis not present

## 2016-10-11 DIAGNOSIS — Z79891 Long term (current) use of opiate analgesic: Secondary | ICD-10-CM

## 2016-10-11 DIAGNOSIS — K589 Irritable bowel syndrome without diarrhea: Secondary | ICD-10-CM | POA: Diagnosis not present

## 2016-10-11 DIAGNOSIS — M19011 Primary osteoarthritis, right shoulder: Secondary | ICD-10-CM | POA: Diagnosis not present

## 2016-10-11 DIAGNOSIS — M19012 Primary osteoarthritis, left shoulder: Secondary | ICD-10-CM | POA: Diagnosis not present

## 2016-10-11 DIAGNOSIS — R51 Headache: Secondary | ICD-10-CM | POA: Diagnosis not present

## 2016-10-11 DIAGNOSIS — M545 Low back pain: Secondary | ICD-10-CM | POA: Diagnosis not present

## 2016-10-11 DIAGNOSIS — F1190 Opioid use, unspecified, uncomplicated: Secondary | ICD-10-CM

## 2016-10-11 DIAGNOSIS — Z96641 Presence of right artificial hip joint: Secondary | ICD-10-CM | POA: Diagnosis not present

## 2016-10-11 DIAGNOSIS — M160 Bilateral primary osteoarthritis of hip: Secondary | ICD-10-CM | POA: Diagnosis not present

## 2016-10-11 DIAGNOSIS — M25559 Pain in unspecified hip: Secondary | ICD-10-CM

## 2016-10-11 DIAGNOSIS — F1390 Sedative, hypnotic, or anxiolytic use, unspecified, uncomplicated: Secondary | ICD-10-CM | POA: Diagnosis not present

## 2016-10-11 DIAGNOSIS — M25512 Pain in left shoulder: Secondary | ICD-10-CM

## 2016-10-11 DIAGNOSIS — M25511 Pain in right shoulder: Secondary | ICD-10-CM | POA: Diagnosis not present

## 2016-10-11 DIAGNOSIS — Z79899 Other long term (current) drug therapy: Secondary | ICD-10-CM | POA: Diagnosis not present

## 2016-10-11 DIAGNOSIS — M25551 Pain in right hip: Secondary | ICD-10-CM

## 2016-10-11 DIAGNOSIS — M25552 Pain in left hip: Secondary | ICD-10-CM

## 2016-10-11 MED ORDER — OXYCODONE HCL 10 MG PO TABS: 10.0000 mg | ORAL_TABLET | Freq: Four times a day (QID) | ORAL | 0 refills | Status: DC | PRN

## 2016-10-11 MED ORDER — NALOXONE HCL 2 MG/2ML IJ SOSY: PREFILLED_SYRINGE | INTRAMUSCULAR | 1 refills | Status: DC

## 2016-10-11 NOTE — Progress Notes (Signed)
Patient's Name: [name]  MRN: [id number]  Referring Provider: Jerrol Banana.,*  DOB: [birth year]  PCP: Jerrol Banana., MD  DOS: 10/11/2016  Note by: Gaspar Cola, MD  Service setting: Ambulatory outpatient  Specialty: Interventional Pain Management  Location: [facility] (AMB) Pain Management Facility    Patient type: Established   Primary Reason(s) for Visit: Encounter for post-procedure evaluation of chronic illness with mild to moderate exacerbation CC: Shoulder Pain (bilateral, worse in left)  HPI  [name] is a 56 y.o. year old, female patient, who comes today for a post-procedure evaluation. She has Chronic neck pain; Anxiety state; Asthma, mild; Blurred vision; Benign paroxysmal positional nystagmus; Congenital renal agenesis and dysgenesis; Clinical depression; Colon, diverticulosis; Fatigue; Cannot sleep; IBS (irritable bowel syndrome); Awareness of heartbeats; RAD (reactive airway disease); Apnea, sleep; Esophagitis, reflux; DOE (dyspnea on exertion); Chronic left shoulder pain; Chronic right hip pain; Encounter for therapeutic drug level monitoring; Long term current use of opiate analgesic; Long term prescription opiate use; Opiate use (60 MME/Day); Chronic pain syndrome; Steroid intolerance; Cervical spine pain; Cervical spondylosis; Cervical facet arthropathy (bilateral); History of cervical spinal surgery; Chronic shoulder pain (Secondary source of pain) (Bilateral) (L>R); 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 Her primarily concern today is the Shoulder Pain (bilateral, worse in left)  Pain Assessment: Location: Right, Left Shoulder Radiating: down left arm with numbness in the fingers on the left hand Duration: Chronic pain Quality: Aching, Sharp Severity: 2 /10 (self-reported pain score)  Note: Reported level is compatible with observation.                   Modifying factors: keeping arms still, medications, heat  [name] comes in today for post-procedure evaluation after the treatment done on 06/19/2016.  Further details on both, my assessment(s), as well as the proposed treatment plan, please see below.  Post-Procedure Assessment  06/19/2016 Procedure: Diagnostic bilateral suprascapular nerve #2 (without steroids) Pre-procedure pain score:  3/10 Post-procedure pain score: 3/10 No initial benefit, possible due to rapid discharge after no sedation procedure, without enough time to allow full onset of block. Influential Factors: BMI: 24.03 kg/m Intra-procedural challenges: None observed.         Assessment challenges: None detected.              Reported side-effects: None.        Post-procedural adverse reactions or complications: None reported         Sedation: No sedation used. When no sedatives are used, the analgesic levels obtained are directly associated to the effectiveness of the local anesthetics. However, when sedation is provided, the level of analgesia obtained during the initial 1 hour following the intervention, is believed to be the result of a combination of factors. These factors may include, but are not limited to: 1. The effectiveness of the local anesthetics used. 2. The effects of the analgesic(s) and/or anxiolytic(s) used. 3. The degree of discomfort experienced by the  patient at the time of the procedure. 4. The patients ability and reliability in recalling and recording the events. 5. The presence and influence of possible secondary gains and/or psychosocial factors. Reported result: Relief experienced during the 1st hour after the procedure: 100 % (Ultra-Short Term Relief) Interpretative annotation: Clinically appropriate result. Analgesia during this period is likely to be Local Anesthetic and/or IV Sedative (Analgesic/Anxiolytic) related.          Effects of local anesthetic: The analgesic effects attained during this period are directly associated to the localized infiltration of local anesthetics and therefore cary significant diagnostic value as to the etiological location, or anatomical origin, of the pain. Expected duration of relief is directly dependent on the pharmacodynamics of the local anesthetic used. Long-acting (4-6 hours) anesthetics used.  Reported result: Relief during the next 4 to 6 hour after the procedure: 90 % (Short-Term Relief) Interpretative annotation: Clinically appropriate result. Analgesia during this period is likely to be Local Anesthetic-related.          Long-term benefit: Defined as the period of time past the expected duration of local anesthetics (1 hour for short-acting and 4-6 hours for long-acting). With the possible exception of prolonged sympathetic blockade from the local anesthetics, benefits during this period are typically attributed to, or associated with, other factors such as analgesic sensory neuropraxia, antiinflammatory effects, or beneficial biochemical changes provided by agents other than the local anesthetics.  Reported result: Extended relief following procedure: 0 % ("couple of hours") (Long-Term Relief) Interpretative annotation: Clinically possible results. Recurrence of symptoms. No permanent benefit expected. Etiology is likely mechanical rather than inflammatory.          Current benefits: Defined as  persistent relief that continues at this point in time.   Reported results: Treated area: 0 %       Interpretative annotation: Recurrence of symptoms. No permanent benefit expected. Effective diagnostic intervention.          Interpretation: Results would suggest a successful diagnostic intervention.          Laboratory Chemistry  Inflammation Markers (CRP: Acute Phase) (ESR: Chronic Phase) Lab Results  Component Value Date   CRP 0.5 04/05/2015   ESRSEDRATE 37 (H) 04/05/2015                 Renal Function Markers Lab Results  Component Value Date   BUN 11 01/27/2016   CREATININE 0.85 01/27/2016   GFRAA 89 01/27/2016   GFRNONAA 77 01/27/2016                 Hepatic Function Markers Lab Results  Component Value Date   AST 15 01/27/2016   ALT 8 01/27/2016   ALBUMIN 4.3 01/27/2016   ALKPHOS 118 (H) 01/27/2016                 Electrolytes Lab Results  Component Value Date   NA 138 01/27/2016   K 4.4 01/27/2016   CL 99 01/27/2016   CALCIUM 9.5 01/27/2016   MG 2.1 04/05/2015                 Neuropathy Markers No results found for: [id number]               Bone Pathology Markers Lab Results  Component Value Date   ALKPHOS 118 (H) 01/27/2016   CALCIUM 9.5 01/27/2016                 Coagulation Parameters Lab Results  Component Value Date  INR 1.05 03/06/2015   LABPROT 13.9 03/06/2015   APTT 33 03/06/2015   PLT 305 01/27/2016                 Cardiovascular Markers Lab Results  Component Value Date   HGB 13.1 01/27/2016   HCT 38.4 01/27/2016                 Note: Lab results reviewed.  Recent Diagnostic Imaging Review  Ct Renal Stone Study Result Date: 08/28/2016 CLINICAL DATA:  LEFT flank pain, gross hematuria, history of kidney stones, cholecystectomy, hysterectomy EXAM: CT ABDOMEN AND PELVIS WITHOUT CONTRAST TECHNIQUE: Multidetector CT imaging of the abdomen and pelvis was performed following the standard protocol without IV contrast. Sagittal and  coronal MPR images reconstructed from axial data set. Oral contrast was not administered for this indication COMPARISON:  None FINDINGS: Lower chest: Linear subsegmental atelectasis LEFT lower lobe and RIGHT middle lobe. Hepatobiliary: Gallbladder surgically absent. No biliary dilatation. Liver unremarkable. Pancreas: Normal appearance Spleen: Normal appearance Adrenals/Urinary Tract: Adrenal glands normal appearance. RIGHT kidney small in size at 7.5 cm length, otherwise unremarkable. LEFT kidney normal appearance without mass. No urinary tract calcification, hydronephrosis or ureteral dilatation. Bladder incompletely distended. Ureters unremarkable. Stomach/Bowel: Normal appendix. Stomach decompressed. Large and small bowel loops grossly unremarkable. Vascular/Lymphatic: Few scattered pelvic phleboliths. Aorta normal caliber. No adenopathy. Reproductive: Uterus surgically absent with unremarkable ovaries. Other: No free air or free fluid. No hernia or acute inflammatory process. Musculoskeletal: Prior RIGHT hip replacement and thoracolumbar spinal fusion. No acute osseous findings. IMPRESSION: Mild RIGHT renal atrophy. No acute urinary tract abnormalities. No acute intra-abdominal or intrapelvic abnormalities. Prior RIGHT hip arthroplasty and thoracolumbar spinal fusion. Electronically Signed   By: Lavonia Dana M.D.   On: 08/28/2016 15:54   Note: Imaging results reviewed.          Meds   Current Meds  Medication Sig  . albuterol (PROVENTIL HFA;VENTOLIN HFA) 108 (90 BASE) MCG/ACT inhaler Inhale 2 puffs into the lungs every 6 (six) hours as needed for wheezing.  Marland Kitchen ALPRAZolam (XANAX) 1 MG tablet TAKE 1/2 TAB BY MOUTH EVERY MORNING, 1/2 TAB AT LUNCH, AND 2 TABS AT BEDTIME  . buPROPion (WELLBUTRIN SR) 150 MG 12 hr tablet Take 1 tablet (150 mg total) by mouth 2 (two) times daily.  Marland Kitchen escitalopram (LEXAPRO) 10 MG tablet Take 1 tablet (10 mg total) by mouth at bedtime. Take 1/2 tab PO q h.s. X 1 week then  increase to 1 tab PO q h.s.  . fluticasone (FLONASE) 50 MCG/ACT nasal spray Place 2 sprays into both nostrils daily.  . Fluticasone-Salmeterol (ADVAIR DISKUS) 500-50 MCG/DOSE AEPB Inhale 1 puff into the lungs 2 (two) times daily.  Marland Kitchen loratadine (CLARITIN REDITABS) 10 MG dissolvable tablet Take 1 tablet (10 mg total) by mouth daily.  . metoprolol succinate (TOPROL-XL) 50 MG 24 hr tablet TAKE 1 TABLET BY MOUTH EVERY DAY  . montelukast (SINGULAIR) 10 MG tablet Take 1 tablet (10 mg total) by mouth daily.  Marland Kitchen omeprazole (PRILOSEC) 20 MG capsule TAKE ONE CAPSULE BY MOUTH TWICE A DAY  . ondansetron (ZOFRAN) 4 MG tablet Take 1 tablet (4 mg total) by mouth every 8 (eight) hours as needed for nausea or vomiting.  Marland Kitchen Spacer/Aero-Holding Chambers (AEROCHAMBER MV) inhaler by Does not apply route.  . valACYclovir (VALTREX) 1000 MG tablet TAKE TWO TABLETS BY MOUTH TWICE DAILY FOR ONE DAY FEVER BLISTER  . [DISCONTINUED] Oxycodone HCl 10 MG TABS Take 1 tablet (10 mg total)  by mouth every 6 (six) hours as needed.  . [DISCONTINUED] Oxycodone HCl 10 MG TABS Take 1 tablet (10 mg total) by mouth every 6 (six) hours as needed.    ROS  Constitutional: Denies any fever or chills Gastrointestinal: No reported hemesis, hematochezia, vomiting, or acute GI distress Musculoskeletal: Denies any acute onset joint swelling, redness, loss of ROM, or weakness Neurological: No reported episodes of acute onset apraxia, aphasia, dysarthria, agnosia, amnesia, paralysis, loss of coordination, or loss of consciousness  Allergies  Ms. Atha is allergic to amoxicillin; clindamycin; codeine; sulfa antibiotics; shellfish allergy; decadron [dexamethasone]; papaya derivatives; prednisone; betadine [povidone iodine]; chlorhexidine; clarithromycin; clindamycin/lincomycin; and other.  [place]  Drug: Ms. Neyer  reports that she does not use drugs. Alcohol:  reports that she does not drink alcohol. Tobacco:  reports that she has never smoked.  She has never used smokeless tobacco. 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); Anemia; Anxiety; Asthma; Cephalalgia (08/18/2014); Cervical disc disease (08/18/2014); Chronic headaches; DDD (degenerative disc disease); Depression; Dizziness (04/22/2013); Duodenal ulcer with hemorrhage and perforation ([place]) (04/27/2003); GERD (gastroesophageal reflux disease); H/O arthrodesis (08/18/2014); Headache(784.0); History of blood transfusion; History of cervical spinal surgery (01/04/2015); History of kidney stones; Hypertension; Inverted T wave; Narrowing of intervertebral disc space (08/18/2014); Orthostatic hypotension (04/22/2013); Pain; Pneumonia; PONV (postoperative nausea and vomiting); Postop Hyponatremia (05/14/2012); Postoperative anemia due to acute blood loss (05/14/2012); Right foot drop; and Right hip arthralgia (08/18/2014). Surgical: Ms. Brophy  has a past surgical history that includes RIGHT HIP ARTHROSCOPY FOR LABRAL TEAR (ABOUT 2010); Anterior fusion cervical spine (MAY 2012); Nasal septum surgery (MARCH 2013); Back surgery (2009); Tonsillectomy; Abdominal hysterectomy; DIAGNOSTIC LAPAROSCOPIES - MULTIPLE FOR ENDOMETRIOSIS; Cholecystectomy; Hip arthroscopy (09/20/2011); Shoulder arthroscopy (05-06-12); Carpal tunnel release (05-06-12); Total hip arthroplasty (Right, 05/13/2012); Anterior cervical decomp/discectomy fusion (N/A, 10/30/2012); Posterior cervical fusion/foraminotomy (N/A, 04/16/2013); Breast biopsy (Right, 2008); and Breast excisional biopsy (Left, 1998). Family: family history includes Aneurysm in her maternal grandmother and mother; Breast cancer (age of onset: 56) in her paternal grandmother.  Constitutional Exam  General appearance: Well nourished, well developed, and well hydrated. In no apparent acute distress Vitals:   10/11/16 1329  BP: 138/71  Pulse: 70  Resp: 16  Temp: 98.4 F (36.9 C)  TempSrc: Oral  SpO2: 100%  Weight: 140 lb (63.5 kg)  Height: 5' 4"   (1.626 m)   BMI Assessment: Estimated body mass index is 24.03 kg/m as calculated from the following:   Height as of this encounter: 5' 4"  (1.626 m).   Weight as of this encounter: 140 lb (63.5 kg).  BMI interpretation table: BMI level Category Range association with higher incidence of chronic pain  <18 kg/m2 Underweight   18.5-24.9 kg/m2 Ideal body weight   25-29.9 kg/m2 Overweight Increased incidence by 20%  30-34.9 kg/m2 Obese (Class I) Increased incidence by 68%  35-39.9 kg/m2 Severe obesity (Class II) Increased incidence by 136%  >40 kg/m2 Extreme obesity (Class III) Increased incidence by 254%   BMI Readings from Last 4 Encounters:  10/11/16 24.03 kg/m  08/25/16 25.40 kg/m  07/13/16 25.75 kg/m  06/19/16 25.75 kg/m   Wt Readings from Last 4 Encounters:  10/11/16 140 lb (63.5 kg)  08/25/16 148 lb (67.1 kg)  07/13/16 150 lb (68 kg)  06/19/16 150 lb (68 kg)  Psych/Mental status: Alert, oriented x 3 (person, place, & time)       Eyes: PERLA Respiratory: No evidence of acute respiratory distress  Cervical Spine Exam  Inspection:  No masses, redness, or swelling Alignment: Symmetrical Functional ROM: Unrestricted ROM      Stability: No instability detected Muscle strength & Tone: Functionally intact Sensory: Unimpaired Palpation: No palpable anomalies              Upper Extremity (UE) Exam    Side: Right upper extremity  Side: Left upper extremity  Inspection: No masses, redness, swelling, or asymmetry. No contractures  Inspection: No masses, redness, swelling, or asymmetry. No contractures  Functional ROM: Limited ROM for shoulder  Functional ROM: Limited ROM for shoulder  Muscle strength & Tone: Functionally intact  Muscle strength & Tone: Functionally intact  Sensory: Unimpaired  Sensory: Unimpaired  Palpation: No palpable anomalies              Palpation: No palpable anomalies              Specialized Test(s): Deferred         Specialized Test(s): Deferred           Thoracic Spine Exam  Inspection: No masses, redness, or swelling Alignment: Symmetrical Functional ROM: Unrestricted ROM Stability: No instability detected Sensory: Unimpaired Muscle strength & Tone: No palpable anomalies  Lumbar Spine Exam  Inspection: No masses, redness, or swelling Alignment: Symmetrical Functional ROM: Unrestricted ROM      Stability: No instability detected Muscle strength & Tone: Functionally intact Sensory: Unimpaired Palpation: No palpable anomalies       Provocative Tests: Lumbar Hyperextension and rotation test: evaluation deferred today       Lumbar Lateral bending test: evaluation deferred today       Patrick's Maneuver: evaluation deferred today                    Gait & Posture Assessment  Ambulation: Unassisted Gait: Relatively normal for age and body habitus Posture: WNL   Lower Extremity Exam    Side: Right lower extremity  Side: Left lower extremity  Inspection: No masses, redness, swelling, or asymmetry. No contractures  Inspection: No masses, redness, swelling, or asymmetry. No contractures  Functional ROM: Unrestricted ROM          Functional ROM: Unrestricted ROM          Muscle strength & Tone: Functionally intact  Muscle strength & Tone: Functionally intact  Sensory: Unimpaired  Sensory: Unimpaired  Palpation: No palpable anomalies  Palpation: No palpable anomalies   Assessment  Primary Diagnosis & Pertinent Problem List: The primary encounter diagnosis was Chronic hip pain (Primary Source of Pain) (Bilateral) (L>R). 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  Status Diagnosis  Persistent Persistent Persistent 1. Chronic hip pain (Primary Source of Pain) (Bilateral) (L>R)   2. Chronic shoulder pain (Secondary source of pain) (Bilateral) (L>R)   3. Chronic low back pain [facility] source of pain) (Bilateral) (L>R)   4. Primary osteoarthritis of both hips   5. 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 Chronic left shoulder pain   7. Chronic pain syndrome   8. Long term prescription opiate use   9. Long term current use of opiate analgesic   10. Long term prescription benzodiazepine use  11. Opiate use (60 MME/Day)     Problems updated and reviewed during this visit: Problem  Osteoarthritis of hip (Location of Primary Source of Pain) (Bilateral) (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 THR)  Chronic hip pain (Primary Source of Pain) (Bilateral) (L>R)  Chronic low back pain (Tertiary source of pain) (Bilateral) (L>R)  Chronic shoulder pain (Secondary source of pain) (Bilateral) (L>R)  Long Term Prescription Benzodiazepine Use   Plan of Care  Pharmacotherapy (Medications Ordered): Meds ordered this encounter  Medications  . DISCONTD: Oxycodone HCl 10 MG TABS   New Prescriptions   No medications on file   Medications administered today: Ms. Asaro had no medications administered during this visit.  Lab-work, procedure(s), and/or referral(s): Orders Placed This Encounter  Procedures  . Radiofrequency Shoulder Joint    Interventional management options: Planned, scheduled, and/or pending:   Therapeutic bilateral suprascapular nerve RFA, starting with the left side    Considering:   Palliative bilateral suprascapular nerve block  Possible bilateral suprascapular nerve RFA    Palliative PRN treatment(s):   None at this time.    Provider-requested follow-up: Return for Radiofrequency procedure under fluoro and sedation: Left suprascapular RFA.  No future appointments. Primary Care Physician:  Jerrol Banana., MD Location: [facility] Note by: Gaspar Cola, MD Date: 10/11/2016; Time: 2:32 PM  Patient Instructions   ____________________________________________________________________________________________  Preparing for Procedure with Sedation Instructions: . Oral Intake: Do not eat or drink anything for at least 8 hours prior to your procedure. . Transportation: Public transportation is not allowed. Bring an adult driver. The driver must be physically present in our waiting room before any procedure can be started. Marland Kitchen Physical Assistance: Bring an adult physically capable of assisting you, in the event you need help. This adult should keep you company at home for at least 6 hours after the procedure. . Blood Pressure Medicine: Take your blood pressure medicine with a sip of water the morning of the procedure. . Blood thinners:  . Diabetics on insulin: Notify the staff so that you can be scheduled 1st case in the morning. If your diabetes requires high dose insulin, take only  of your normal insulin dose the morning of the procedure and notify the staff that you have done so. . Preventing infections: Shower with an antibacterial soap the morning of your procedure. . Build-up your immune system: Take 1000 mg of Vitamin C with every meal (3 times a day) the day prior to your procedure. Marland Kitchen Antibiotics: Inform the staff if you have a condition or reason that requires you to take antibiotics before dental procedures. . Pregnancy: If you are pregnant, call and cancel the procedure. . Sickness: If you have a cold, fever, or any active infections, call and cancel the procedure. . Arrival: You must be in the facility at least 30 minutes prior to your scheduled procedure. . Children: Do not bring children with you. . Dress appropriately: Bring dark clothing that you would not mind if they get stained. . Valuables: Do not bring any jewelry or  valuables. Procedure appointments are reserved for interventional treatments only. Marland Kitchen No Prescription Refills. . No medication changes will be discussed during procedure appointments. . No disability issues will be discussed. ____________________________________________________________________________________________  GENERAL RISKS AND COMPLICATIONS  What are the risk, side effects and possible complications? Generally speaking, most procedures are safe.  However, with any procedure there are risks, side effects, and the possibility of complications.  The risks and complications are dependent upon  the sites that are lesioned, or the type of nerve block to be performed.  The closer the procedure is to the spine, the more serious the risks are.  Great care is taken when placing the radio frequency needles, block needles or lesioning probes, but sometimes complications can occur. Infection: Any time there is an injection through the skin, there is a risk of infection.  This is why sterile conditions are used for these blocks.  There are four possible types of infection. Localized skin infection. Central Nervous System Infection-This can be in the form of Meningitis, which can be deadly. Epidural Infections-This can be in the form of an epidural abscess, which can cause pressure inside of the spine, causing compression of the spinal cord with subsequent paralysis. This would require an emergency surgery to decompress, and there are no guarantees that the patient would recover from the paralysis. Discitis-This is an infection of the intervertebral discs.  It occurs in about 1% of discography procedures.  It is difficult to treat and it may lead to surgery.        2. Pain: the needles have to go through skin and soft tissues, will cause soreness.       3. Damage to internal structures:  The nerves to be lesioned may be near blood vessels or    other nerves which can be potentially damaged.        4. Bleeding: Bleeding is more common if the patient is taking blood thinners such as  aspirin, Coumadin, Ticiid, Plavix, etc., or if he/she have some genetic predisposition  such as hemophilia. Bleeding into the spinal canal can cause compression of the spinal  cord with subsequent paralysis.  This would require an emergency surgery to  decompress and there are no guarantees that the patient would recover from the  paralysis.       5. Pneumothorax:  Puncturing of a lung is a possibility, every time a needle is introduced in  the area of the chest or upper back.  Pneumothorax refers to free air around the  collapsed lung(s), inside of the thoracic cavity (chest cavity).  Another two possible  complications related to a similar event would include: Hemothorax and Chylothorax.   These are variations of the Pneumothorax, where instead of air around the collapsed  lung(s), you may have blood or chyle, respectively.       6. Spinal headaches: They may occur with any procedures in the area of the spine.       7. Persistent CSF (Cerebro-Spinal Fluid) leakage: This is a rare problem, but may occur  with prolonged intrathecal or epidural catheters either due to the formation of a fistulous  track or a dural tear.       8. Nerve damage: By working so close to the spinal cord, there is always a possibility of  nerve damage, which could be as serious as a permanent spinal cord injury with  paralysis.       9. Death:  Although rare, severe deadly allergic reactions known as "Anaphylactic  reaction" can occur to any of the medications used.      10. Worsening of the symptoms:  We can always make thing worse.  What are the chances of something like this happening? Chances of any of this occuring are extremely low.  By statistics, you have more of a chance of getting killed in a motor vehicle accident: while driving to the hospital than any of the above occurring .  Nevertheless, you should be aware that they are  possibilities.  In general, it is similar to taking a shower.  Everybody knows that you can slip, hit your head and get killed.  Does that mean that you should not shower again?  Nevertheless always keep in mind that statistics do not mean anything if you happen to be on the wrong side of them.  Even if a procedure has a 1 (one) in a 1,000,000 (million) chance of going wrong, it you happen to be that one..Also, keep in mind that by statistics, you have more of a chance of having something go wrong when taking medications.  Who should not have this procedure? If you are on a blood thinning medication (e.g. Coumadin, Plavix, see list of "Blood Thinners"), or if you have an active infection going on, you should not have the procedure.  If you are taking any blood thinners, please inform your physician.  How should I prepare for this procedure? Do not eat or drink anything at least six hours prior to the procedure. Bring a driver with you .  It cannot be a taxi. Come accompanied by an adult that can drive you back, and that is strong enough to help you if your legs get weak or numb from the local anesthetic. Take all of your medicines the morning of the procedure with just enough water to swallow them. If you have diabetes, make sure that you are scheduled to have your procedure done first thing in the morning, whenever possible. If you have diabetes, take only half of your insulin dose and notify our nurse that you have done so as soon as you arrive at the clinic. If you are diabetic, but only take blood sugar pills (oral hypoglycemic), then do not take them on the morning of your procedure.  You may take them after you have had the procedure. Do not take aspirin or any aspirin-containing medications, at least eleven (11) days prior to the procedure.  They may prolong bleeding. Wear loose fitting clothing that may be easy to take off and that you would not mind if it got stained with Betadine or  blood. Do not wear any jewelry or perfume Remove any nail coloring.  It will interfere with some of our monitoring equipment.  NOTE: Remember that this is not meant to be interpreted as a complete list of all possible complications.  Unforeseen problems may occur.  BLOOD THINNERS The following drugs contain aspirin or other products, which can cause increased bleeding during surgery and should not be taken for 2 weeks prior to and 1 week after surgery.  If you should need take something for relief of minor pain, you may take acetaminophen which is found in Tylenol,m Datril, Anacin-3 and Panadol. It is not blood thinner. The products listed below are.  Do not take any of the products listed below in addition to any listed on your instruction sheet.  A.P.C or A.P.C with Codeine Codeine Phosphate Capsules #3 Ibuprofen Ridaura  ABC compound Congesprin Imuran rimadil  Advil Cope Indocin Robaxisal  Alka-Seltzer Effervescent Pain Reliever and Antacid Coricidin or Coricidin-D  Indomethacin Rufen  Alka-Seltzer plus Cold Medicine Cosprin Ketoprofen S-A-C Tablets  Anacin Analgesic Tablets or Capsules Coumadin Korlgesic Salflex  Anacin Extra Strength Analgesic tablets or capsules CP-2 Tablets Lanoril Salicylate  Anaprox Cuprimine Capsules Levenox Salocol  Anexsia-D Dalteparin Magan Salsalate  Anodynos Darvon compound Magnesium Salicylate Sine-off  Ansaid Dasin Capsules Magsal Sodium Salicylate  Anturane Depen Capsules Marnal  Soma  APF Arthritis pain formula Dewitt's Pills Measurin Stanback  Argesic Dia-Gesic Meclofenamic Sulfinpyrazone  Arthritis Bayer Timed Release Aspirin Diclofenac Meclomen Sulindac  Arthritis pain formula Anacin Dicumarol Medipren Supac  Analgesic (Safety coated) Arthralgen Diffunasal Mefanamic Suprofen  Arthritis Strength Bufferin Dihydrocodeine Mepro Compound Suprol  Arthropan liquid Dopirydamole Methcarbomol with Aspirin Synalgos  ASA tablets/Enseals Disalcid Micrainin  Tagament  Ascriptin Doan's Midol Talwin  Ascriptin A/D Dolene Mobidin Tanderil  Ascriptin Extra Strength Dolobid Moblgesic Ticlid  Ascriptin with Codeine Doloprin or Doloprin with Codeine Momentum Tolectin  Asperbuf Duoprin Mono-gesic Trendar  Aspergum Duradyne Motrin or Motrin IB Triminicin  Aspirin plain, buffered or enteric coated Durasal Myochrisine Trigesic  Aspirin Suppositories Easprin Nalfon Trillsate  Aspirin with Codeine Ecotrin Regular or Extra Strength Naprosyn Uracel  Atromid-S Efficin Naproxen Ursinus  Auranofin Capsules Elmiron Neocylate Vanquish  Axotal Emagrin Norgesic Verin  Azathioprine Empirin or Empirin with Codeine Normiflo Vitamin E  Azolid Emprazil Nuprin Voltaren  Bayer Aspirin plain, buffered or children's or timed BC Tablets or powders Encaprin Orgaran Warfarin Sodium  Buff-a-Comp Enoxaparin Orudis Zorpin  Buff-a-Comp with Codeine Equegesic Os-Cal-Gesic   Buffaprin Excedrin plain, buffered or Extra Strength Oxalid   Bufferin Arthritis Strength Feldene Oxphenbutazone   Bufferin plain or Extra Strength Feldene Capsules Oxycodone with Aspirin   Bufferin with Codeine Fenoprofen Fenoprofen Pabalate or Pabalate-SF   Buffets II Flogesic Panagesic   Buffinol plain or Extra Strength Florinal or Florinal with Codeine Panwarfarin   Buf-Tabs Flurbiprofen Penicillamine   Butalbital Compound Four-way cold tablets Penicillin   Butazolidin Fragmin Pepto-Bismol   Carbenicillin Geminisyn Percodan   Carna Arthritis Reliever Geopen Persantine   Carprofen Gold's salt Persistin   Chloramphenicol Goody's Phenylbutazone   Chloromycetin Haltrain Piroxlcam   Clmetidine heparin Plaquenil   Cllnoril Hyco-pap Ponstel   Clofibrate Hydroxy chloroquine Propoxyphen         Before stopping any of these medications, be sure to consult the physician who ordered them.  Some, such as Coumadin (Warfarin) are ordered to prevent or treat serious conditions such as "deep thrombosis", "pumonary  embolisms", and other heart problems.  The amount of time that you may need off of the medication may also vary with the medication and the reason for which you were taking it.  If you are taking any of these medications, please make sure you notify your pain physician before you undergo any procedures.         Radiofrequency Lesioning Radiofrequency lesioning is a procedure that is performed to relieve pain. The procedure is often used for back, neck, or arm pain. Radiofrequency lesioning involves the use of a machine that creates radio waves to make heat. During the procedure, the heat is applied to the nerve that carries the pain signal. The heat damages the nerve and interferes with the pain signal. Pain relief usually starts about 2 weeks after the procedure and lasts for 6 months to 1 year. Tell a health care provider about: Any allergies you have. All medicines you are taking, including vitamins, herbs, eye drops, creams, and over-the-counter medicines. Any problems you or family members have had with anesthetic medicines. Any blood disorders you have. Any surgeries you have had. Any medical conditions you have. Whether you are pregnant or may be pregnant. What are the risks? Generally, this is a safe procedure. However, problems may occur, including: Pain or soreness at the injection site. Infection at the injection site. Damage to nerves or blood vessels.  What happens before the procedure? Ask your health  care provider about: Changing or stopping your regular medicines. This is especially important if you are taking diabetes medicines or blood thinners. Taking medicines such as aspirin and ibuprofen. These medicines can thin your blood. Do not take these medicines before your procedure if your health care provider instructs you not to. Follow instructions from your health care provider about eating or drinking restrictions. Plan to have someone take you home after the  procedure. If you go home right after the procedure, plan to have someone with you for 24 hours. What happens during the procedure? You will be given one or more of the following: A medicine to help you relax (sedative). A medicine to numb the area (local anesthetic). You will be awake during the procedure. You will need to be able to talk with the health care provider during the procedure. With the help of a type of X-ray (fluoroscopy), the health care provider will insert a radiofrequency needle into the area to be treated. Next, a wire that carries the radio waves (electrode) will be put through the radiofrequency needle. An electrical pulse will be sent through the electrode to verify the correct nerve. You will feel a tingling sensation, and you may have muscle twitching. Then, the tissue that is around the needle tip will be heated by an electric current that is passed using the radiofrequency machine. This will numb the nerves. A bandage (dressing) will be put on the insertion area after the procedure is done. The procedure may vary among health care providers and hospitals. What happens after the procedure? Your blood pressure, heart rate, breathing rate, and blood oxygen level will be monitored often until the medicines you were given have worn off. Return to your normal activities as directed by your health care provider. This information is not intended to replace advice given to you by your health care provider. Make sure you discuss any questions you have with your health care provider. Document Released: 10/26/2010 Document Revised: 08/05/2015 Document Reviewed: 04/06/2014 [employer] Interactive Patient Education  [employer].

## 2016-10-11 NOTE — Patient Instructions (Addendum)
____________________________________________________________________________________________  Preparing for Procedure with Sedation Instructions: . Oral Intake: Do not eat or drink anything for at least 8 hours prior to your procedure. . Transportation: Public transportation is not allowed. Bring an adult driver. The driver must be physically present in our waiting room before any procedure can be started. Marland Kitchen Physical Assistance: Bring an adult physically capable of assisting you, in the event you need help. This adult should keep you company at home for at least 6 hours after the procedure. . Blood Pressure Medicine: Take your blood pressure medicine with a sip of water the morning of the procedure. . Blood thinners:  . Diabetics on insulin: Notify the staff so that you can be scheduled 1st case in the morning. If your diabetes requires high dose insulin, take only  of your normal insulin dose the morning of the procedure and notify the staff that you have done so. . Preventing infections: Shower with an antibacterial soap the morning of your procedure. . Build-up your immune system: Take 1000 mg of Vitamin C with every meal (3 times a day) the day prior to your procedure. Marland Kitchen Antibiotics: Inform the staff if you have a condition or reason that requires you to take antibiotics before dental procedures. . Pregnancy: If you are pregnant, call and cancel the procedure. . Sickness: If you have a cold, fever, or any active infections, call and cancel the procedure. . Arrival: You must be in the facility at least 30 minutes prior to your scheduled procedure. . Children: Do not bring children with you. . Dress appropriately: Bring dark clothing that you would not mind if they get stained. . Valuables: Do not bring any jewelry or valuables. Procedure appointments are reserved for interventional treatments only. Marland Kitchen No Prescription Refills. . No medication changes will be discussed during procedure  appointments. . No disability issues will be discussed. ____________________________________________________________________________________________  GENERAL RISKS AND COMPLICATIONS  What are the risk, side effects and possible complications? Generally speaking, most procedures are safe.  However, with any procedure there are risks, side effects, and the possibility of complications.  The risks and complications are dependent upon the sites that are lesioned, or the type of nerve block to be performed.  The closer the procedure is to the spine, the more serious the risks are.  Great care is taken when placing the radio frequency needles, block needles or lesioning probes, but sometimes complications can occur. Infection: Any time there is an injection through the skin, there is a risk of infection.  This is why sterile conditions are used for these blocks.  There are four possible types of infection. Localized skin infection. Central Nervous System Infection-This can be in the form of Meningitis, which can be deadly. Epidural Infections-This can be in the form of an epidural abscess, which can cause pressure inside of the spine, causing compression of the spinal cord with subsequent paralysis. This would require an emergency surgery to decompress, and there are no guarantees that the patient would recover from the paralysis. Discitis-This is an infection of the intervertebral discs.  It occurs in about 1% of discography procedures.  It is difficult to treat and it may lead to surgery.        2. Pain: the needles have to go through skin and soft tissues, will cause soreness.       3. Damage to internal structures:  The nerves to be lesioned may be near blood vessels or    other nerves which can be  potentially damaged.       4. Bleeding: Bleeding is more common if the patient is taking blood thinners such as  aspirin, Coumadin, Ticiid, Plavix, etc., or if he/she have some genetic predisposition  such  as hemophilia. Bleeding into the spinal canal can cause compression of the spinal  cord with subsequent paralysis.  This would require an emergency surgery to  decompress and there are no guarantees that the patient would recover from the  paralysis.       5. Pneumothorax:  Puncturing of a lung is a possibility, every time a needle is introduced in  the area of the chest or upper back.  Pneumothorax refers to free air around the  collapsed lung(s), inside of the thoracic cavity (chest cavity).  Another two possible  complications related to a similar event would include: Hemothorax and Chylothorax.   These are variations of the Pneumothorax, where instead of air around the collapsed  lung(s), you may have blood or chyle, respectively.       6. Spinal headaches: They may occur with any procedures in the area of the spine.       7. Persistent CSF (Cerebro-Spinal Fluid) leakage: This is a rare problem, but may occur  with prolonged intrathecal or epidural catheters either due to the formation of a fistulous  track or a dural tear.       8. Nerve damage: By working so close to the spinal cord, there is always a possibility of  nerve damage, which could be as serious as a permanent spinal cord injury with  paralysis.       9. Death:  Although rare, severe deadly allergic reactions known as "Anaphylactic  reaction" can occur to any of the medications used.      10. Worsening of the symptoms:  We can always make thing worse.  What are the chances of something like this happening? Chances of any of this occuring are extremely low.  By statistics, you have more of a chance of getting killed in a motor vehicle accident: while driving to the hospital than any of the above occurring .  Nevertheless, you should be aware that they are possibilities.  In general, it is similar to taking a shower.  Everybody knows that you can slip, hit your head and get killed.  Does that mean that you should not shower again?   Nevertheless always keep in mind that statistics do not mean anything if you happen to be on the wrong side of them.  Even if a procedure has a 1 (one) in a 1,000,000 (million) chance of going wrong, it you happen to be that one..Also, keep in mind that by statistics, you have more of a chance of having something go wrong when taking medications.  Who should not have this procedure? If you are on a blood thinning medication (e.g. Coumadin, Plavix, see list of "Blood Thinners"), or if you have an active infection going on, you should not have the procedure.  If you are taking any blood thinners, please inform your physician.  How should I prepare for this procedure? Do not eat or drink anything at least six hours prior to the procedure. Bring a driver with you .  It cannot be a taxi. Come accompanied by an adult that can drive you back, and that is strong enough to help you if your legs get weak or numb from the local anesthetic. Take all of your medicines the morning of the procedure  with just enough water to swallow them. If you have diabetes, make sure that you are scheduled to have your procedure done first thing in the morning, whenever possible. If you have diabetes, take only half of your insulin dose and notify our nurse that you have done so as soon as you arrive at the clinic. If you are diabetic, but only take blood sugar pills (oral hypoglycemic), then do not take them on the morning of your procedure.  You may take them after you have had the procedure. Do not take aspirin or any aspirin-containing medications, at least eleven (11) days prior to the procedure.  They may prolong bleeding. Wear loose fitting clothing that may be easy to take off and that you would not mind if it got stained with Betadine or blood. Do not wear any jewelry or perfume Remove any nail coloring.  It will interfere with some of our monitoring equipment.  NOTE: Remember that this is not meant to be interpreted  as a complete list of all possible complications.  Unforeseen problems may occur.  BLOOD THINNERS The following drugs contain aspirin or other products, which can cause increased bleeding during surgery and should not be taken for 2 weeks prior to and 1 week after surgery.  If you should need take something for relief of minor pain, you may take acetaminophen which is found in Tylenol,m Datril, Anacin-3 and Panadol. It is not blood thinner. The products listed below are.  Do not take any of the products listed below in addition to any listed on your instruction sheet.  A.P.C or A.P.C with Codeine Codeine Phosphate Capsules #3 Ibuprofen Ridaura  ABC compound Congesprin Imuran rimadil  Advil Cope Indocin Robaxisal  Alka-Seltzer Effervescent Pain Reliever and Antacid Coricidin or Coricidin-D  Indomethacin Rufen  Alka-Seltzer plus Cold Medicine Cosprin Ketoprofen S-A-C Tablets  Anacin Analgesic Tablets or Capsules Coumadin Korlgesic Salflex  Anacin Extra Strength Analgesic tablets or capsules CP-2 Tablets Lanoril Salicylate  Anaprox Cuprimine Capsules Levenox Salocol  Anexsia-D Dalteparin Magan Salsalate  Anodynos Darvon compound Magnesium Salicylate Sine-off  Ansaid Dasin Capsules Magsal Sodium Salicylate  Anturane Depen Capsules Marnal Soma  APF Arthritis pain formula Dewitt's Pills Measurin Stanback  Argesic Dia-Gesic Meclofenamic Sulfinpyrazone  Arthritis Bayer Timed Release Aspirin Diclofenac Meclomen Sulindac  Arthritis pain formula Anacin Dicumarol Medipren Supac  Analgesic (Safety coated) Arthralgen Diffunasal Mefanamic Suprofen  Arthritis Strength Bufferin Dihydrocodeine Mepro Compound Suprol  Arthropan liquid Dopirydamole Methcarbomol with Aspirin Synalgos  ASA tablets/Enseals Disalcid Micrainin Tagament  Ascriptin Doan's Midol Talwin  Ascriptin A/D Dolene Mobidin Tanderil  Ascriptin Extra Strength Dolobid Moblgesic Ticlid  Ascriptin with Codeine Doloprin or Doloprin with Codeine  Momentum Tolectin  Asperbuf Duoprin Mono-gesic Trendar  Aspergum Duradyne Motrin or Motrin IB Triminicin  Aspirin plain, buffered or enteric coated Durasal Myochrisine Trigesic  Aspirin Suppositories Easprin Nalfon Trillsate  Aspirin with Codeine Ecotrin Regular or Extra Strength Naprosyn Uracel  Atromid-S Efficin Naproxen Ursinus  Auranofin Capsules Elmiron Neocylate Vanquish  Axotal Emagrin Norgesic Verin  Azathioprine Empirin or Empirin with Codeine Normiflo Vitamin E  Azolid Emprazil Nuprin Voltaren  Bayer Aspirin plain, buffered or children's or timed BC Tablets or powders Encaprin Orgaran Warfarin Sodium  Buff-a-Comp Enoxaparin Orudis Zorpin  Buff-a-Comp with Codeine Equegesic Os-Cal-Gesic   Buffaprin Excedrin plain, buffered or Extra Strength Oxalid   Bufferin Arthritis Strength Feldene Oxphenbutazone   Bufferin plain or Extra Strength Feldene Capsules Oxycodone with Aspirin   Bufferin with Codeine Fenoprofen Fenoprofen Pabalate or Pabalate-SF   Buffets  II Flogesic Panagesic   Buffinol plain or Extra Strength Florinal or Florinal with Codeine Panwarfarin   Buf-Tabs Flurbiprofen Penicillamine   Butalbital Compound Four-way cold tablets Penicillin   Butazolidin Fragmin Pepto-Bismol   Carbenicillin Geminisyn Percodan   Carna Arthritis Reliever Geopen Persantine   Carprofen Gold's salt Persistin   Chloramphenicol Goody's Phenylbutazone   Chloromycetin Haltrain Piroxlcam   Clmetidine heparin Plaquenil   Cllnoril Hyco-pap Ponstel   Clofibrate Hydroxy chloroquine Propoxyphen         Before stopping any of these medications, be sure to consult the physician who ordered them.  Some, such as Coumadin (Warfarin) are ordered to prevent or treat serious conditions such as "deep thrombosis", "pumonary embolisms", and other heart problems.  The amount of time that you may need off of the medication may also vary with the medication and the reason for which you were taking it.  If you are  taking any of these medications, please make sure you notify your pain physician before you undergo any procedures.         Radiofrequency Lesioning Radiofrequency lesioning is a procedure that is performed to relieve pain. The procedure is often used for back, neck, or arm pain. Radiofrequency lesioning involves the use of a machine that creates radio waves to make heat. During the procedure, the heat is applied to the nerve that carries the pain signal. The heat damages the nerve and interferes with the pain signal. Pain relief usually starts about 2 weeks after the procedure and lasts for 6 months to 1 year. Tell a health care provider about: Any allergies you have. All medicines you are taking, including vitamins, herbs, eye drops, creams, and over-the-counter medicines. Any problems you or family members have had with anesthetic medicines. Any blood disorders you have. Any surgeries you have had. Any medical conditions you have. Whether you are pregnant or may be pregnant. What are the risks? Generally, this is a safe procedure. However, problems may occur, including: Pain or soreness at the injection site. Infection at the injection site. Damage to nerves or blood vessels.  What happens before the procedure? Ask your health care provider about: Changing or stopping your regular medicines. This is especially important if you are taking diabetes medicines or blood thinners. Taking medicines such as aspirin and ibuprofen. These medicines can thin your blood. Do not take these medicines before your procedure if your health care provider instructs you not to. Follow instructions from your health care provider about eating or drinking restrictions. Plan to have someone take you home after the procedure. If you go home right after the procedure, plan to have someone with you for 24 hours. What happens during the procedure? You will be given one or more of the following: A medicine to  help you relax (sedative). A medicine to numb the area (local anesthetic). You will be awake during the procedure. You will need to be able to talk with the health care provider during the procedure. With the help of a type of X-ray (fluoroscopy), the health care provider will insert a radiofrequency needle into the area to be treated. Next, a wire that carries the radio waves (electrode) will be put through the radiofrequency needle. An electrical pulse will be sent through the electrode to verify the correct nerve. You will feel a tingling sensation, and you may have muscle twitching. Then, the tissue that is around the needle tip will be heated by an electric current that is passed using the radiofrequency  machine. This will numb the nerves. A bandage (dressing) will be put on the insertion area after the procedure is done. The procedure may vary among health care providers and hospitals. What happens after the procedure? Your blood pressure, heart rate, breathing rate, and blood oxygen level will be monitored often until the medicines you were given have worn off. Return to your normal activities as directed by your health care provider. This information is not intended to replace advice given to you by your health care provider. Make sure you discuss any questions you have with your health care provider. Document Released: 10/26/2010 Document Revised: 08/05/2015 Document Reviewed: 04/06/2014 Elsevier Interactive Patient Education  [employer].

## 2016-10-11 NOTE — Progress Notes (Signed)
Nursing Pain Medication Assessment:  Safety precautions to be maintained throughout the outpatient stay will include: orient to surroundings, keep bed in low position, maintain call bell within reach at all times, provide assistance with transfer out of bed and ambulation.  Medication Inspection Compliance: Pill count conducted under aseptic conditions, in front of the patient. Neither the pills nor the bottle was removed from the patient's sight at any time. Once count was completed pills were immediately returned to the patient in their original bottle.  Medication: Oxycodone IR Pill/Patch Count: 0 of 120 pills remain Pill/Patch Appearance: Markings consistent with prescribed medication Bottle Appearance: Standard pharmacy container. Clearly labeled. Filled Date: 07/02/ 2018 Last Medication intake:  Today

## 2016-10-12 ENCOUNTER — Encounter: Payer: Medicare Other | Admitting: Pain Medicine

## 2016-10-27 ENCOUNTER — Encounter: Payer: Self-pay | Admitting: Physician Assistant

## 2016-10-27 ENCOUNTER — Ambulatory Visit (INDEPENDENT_AMBULATORY_CARE_PROVIDER_SITE_OTHER): Payer: Medicare Other | Admitting: Physician Assistant

## 2016-10-27 VITALS — BP 116/74 | HR 72 | Temp 98.1°F | Resp 16 | Wt 152.0 lb

## 2016-10-27 DIAGNOSIS — R52 Pain, unspecified: Secondary | ICD-10-CM | POA: Diagnosis not present

## 2016-10-27 NOTE — Progress Notes (Signed)
Patient: [name] Female    DOB: 18-Sep-1960   56 y.o.   MRN: [id number] Visit Date: 10/27/2016  Today's Provider: Trinna Post, PA-C   Chief Complaint  Patient presents with  . Pain    Chronic pain; needs a referral to a new pain clinic.    Subjective:    HPI   [name] is a 56 y/o woman with history of chronic pain and multiple prior surgeries who presents today for pain management referral. Previously seen by Dr. Dossie Arbour but medical center is going out of business and doesn't have [place] location any more. Also had issue with scheduling, was rescheduled to later appointment visit. Urine tested negative, some disagreement about that, and patient declined more opioid prescriptions from this provider.    Previously seeing Dr. Dossie Arbour for pain management  Pt comes in today needing a referral to a new pain clinic.  Pt received a letter in the mail stating that [facility] filed bankruptcy and is closing their operations.   She needs a pain clinic that is closer to home.       Allergies  Allergen Reactions  . Amoxicillin Hives  . Clindamycin Hives  . Codeine Hives  . Sulfa Antibiotics Nausea And Vomiting and Hives       . Shellfish Allergy Hives  . Decadron [Dexamethasone] Other (See Comments)    Hot flashes, insomnia, "manic"  . Papaya Derivatives Hives  . Prednisone     High blood pressure, flushed, mood changes, heart palpitations  . Betadine [Povidone Iodine] Hives       . Chlorhexidine Hives       . Clarithromycin Hives  . Clindamycin/Lincomycin Hives  . Other Hives     Mango      Current Outpatient Prescriptions:  .  albuterol (PROVENTIL HFA;VENTOLIN HFA) 108 (90 BASE) MCG/ACT inhaler, Inhale 2 puffs into the lungs every 6 (six) hours as needed for wheezing., Disp: , Rfl:  .  ALPRAZolam (XANAX) 1 MG tablet, TAKE 1/2 TAB BY MOUTH EVERY MORNING, 1/2 TAB AT LUNCH, AND 2 TABS AT BEDTIME, Disp: 90 tablet, Rfl: 5 .  buPROPion  (WELLBUTRIN SR) 150 MG 12 hr tablet, Take 1 tablet (150 mg total) by mouth 2 (two) times daily., Disp: 180 tablet, Rfl: 1 .  escitalopram (LEXAPRO) 10 MG tablet, Take 1 tablet (10 mg total) by mouth at bedtime. Take 1/2 tab PO q h.s. X 1 week then increase to 1 tab PO q h.s., Disp: 90 tablet, Rfl: 3 .  fluticasone (FLONASE) 50 MCG/ACT nasal spray, Place 2 sprays into both nostrils daily., Disp: 16 g, Rfl: 3 .  Fluticasone-Salmeterol (ADVAIR DISKUS) 500-50 MCG/DOSE AEPB, Inhale 1 puff into the lungs 2 (two) times daily., Disp: 60 each, Rfl: 5 .  loratadine (CLARITIN REDITABS) 10 MG dissolvable tablet, Take 1 tablet (10 mg total) by mouth daily., Disp: 90 tablet, Rfl: 3 .  metoprolol succinate (TOPROL-XL) 50 MG 24 hr tablet, TAKE 1 TABLET BY MOUTH EVERY DAY, Disp: 90 tablet, Rfl: 1 .  montelukast (SINGULAIR) 10 MG tablet, Take 1 tablet (10 mg total) by mouth daily., Disp: 90 tablet, Rfl: 1 .  omeprazole (PRILOSEC) 20 MG capsule, TAKE ONE CAPSULE BY MOUTH TWICE A DAY, Disp: 60 capsule, Rfl: 5 .  ondansetron (ZOFRAN) 4 MG tablet, Take 1 tablet (4 mg total) by mouth every 8 (eight) hours as needed for nausea or vomiting., Disp: 20 tablet, Rfl: 0 .  Spacer/Aero-Holding  Chambers (AEROCHAMBER MV) inhaler, by Does not apply route., Disp: , Rfl:  .  valACYclovir (VALTREX) 1000 MG tablet, TAKE TWO TABLETS BY MOUTH TWICE DAILY FOR ONE DAY FEVER BLISTER, Disp: 4 tablet, Rfl: 5  Review of Systems  Constitutional: Positive for fatigue. Negative for activity change, appetite change, chills, diaphoresis, fever and unexpected weight change.  Respiratory: Negative.   Cardiovascular: Negative.   Gastrointestinal: Negative.   Musculoskeletal: Positive for arthralgias, back pain, gait problem, joint swelling, myalgias, neck pain and neck stiffness.  Neurological: Negative for dizziness, light-headedness and headaches.    Social History  Substance Use Topics  . Smoking status: Never Smoker  . Smokeless tobacco:  Never Used  . Alcohol use No   Objective:   BP 116/74 (BP Location: Left Arm, Patient Position: Sitting, Cuff Size: Normal)   Pulse 72   Temp 98.1 F (36.7 C) (Oral)   Resp 16   Wt 152 lb (68.9 kg)   BMI 26.09 kg/m  Vitals:   10/27/16 1007  BP: 116/74  Pulse: 72  Resp: 16  Temp: 98.1 F (36.7 C)  TempSrc: Oral  Weight: 152 lb (68.9 kg)     Physical Exam  Constitutional: She is oriented to person, place, and time. She appears well-developed and well-nourished.  Neurological: She is alert and oriented to person, place, and time.  Skin: Skin is warm and dry.  Psychiatric: She has a normal mood and affect. Her behavior is normal.        Assessment & Plan:     1. Pain management  Patient prefers to be seen at [facility] with [facility]. Suggested if it takes too long to get in there, she might be able to see Dr. Holley Raring who is new to pain management in [place].   - Ambulatory referral to Pain Clinic  Return if symptoms worsen or fail to improve.  The entirety of the information documented in the History of Present Illness, Review of Systems and Physical Exam were personally obtained by me. Portions of this information were initially documented by Ashley Royalty, CMA and reviewed by me for thoroughness and accuracy.        Trinna Post, PA-C  [facility]

## 2016-10-27 NOTE — Patient Instructions (Signed)

## 2016-11-06 ENCOUNTER — Telehealth: Payer: Self-pay

## 2016-11-06 NOTE — Telephone Encounter (Signed)
Attempted to call patient, no answer 

## 2016-11-06 NOTE — Telephone Encounter (Signed)
Unable to reach patient.

## 2016-11-06 NOTE — Telephone Encounter (Signed)
The patient called, she is in a lot of pain, and is " eating ibuprofen so much she is bruising". She has an RFA ordered and is on the schedule for 01/18/17 pending insurance authorization. She wants to know what she can do until then for the pain.

## 2016-11-09 NOTE — Telephone Encounter (Signed)
Patient lvmail stating she is in a lot of pain please call her.  [phone]

## 2016-11-14 ENCOUNTER — Telehealth: Payer: Self-pay | Admitting: *Deleted

## 2016-11-14 NOTE — Telephone Encounter (Signed)
Left voicemail for patient to return call.

## 2016-11-23 ENCOUNTER — Other Ambulatory Visit: Payer: Self-pay | Admitting: Physician Assistant

## 2016-11-23 DIAGNOSIS — J45909 Unspecified asthma, uncomplicated: Secondary | ICD-10-CM

## 2016-11-27 ENCOUNTER — Ambulatory Visit: Payer: Medicare Other | Admitting: Pain Medicine

## 2016-11-27 NOTE — Progress Notes (Deleted)
Patient's Name: [name]  MRN: [id number]  Referring Provider: Jerrol Banana.,*  DOB: 03/10/1961  PCP: Jerrol Banana., MD  DOS: 11/27/2016  Note by: Gaspar Cola, MD  Service setting: Ambulatory outpatient  Specialty: Interventional Pain Management  Location: [facility] (AMB) Pain Management Facility    Patient type: Established   Primary Reason(s) for Visit: Evaluation of chronic illnesses with exacerbation, or progression (Level of risk: moderate) CC: No chief complaint on file.  HPI  [name] is a 56 y.o. year old, female patient, who comes today for a follow-up evaluation. She has Chronic neck pain; Anxiety state; Asthma, mild; Blurred vision; Benign paroxysmal positional nystagmus; Congenital renal agenesis and dysgenesis; Clinical depression; Colon, diverticulosis; Fatigue; Cannot sleep; IBS (irritable bowel syndrome); Awareness of heartbeats; RAD (reactive airway disease); Apnea, sleep; Esophagitis, reflux; DOE (dyspnea on exertion); Chronic left shoulder pain; Chronic right hip pain; Encounter for therapeutic drug level monitoring; Long term current use of opiate analgesic; Long term prescription opiate use; Opiate use (60 MME/Day); Chronic pain syndrome; Steroid intolerance; Cervical spine pain; Cervical spondylosis; Cervical facet arthropathy (bilateral); History of cervical spinal surgery; Chronic shoulder pain (Secondary source of pain) (Bilateral) (L>R); 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 [name] was last seen on 10/11/2016. Her primarily concern today is the No chief complaint on file.  Pain Assessment: Location:     Radiating:   Onset:   Duration:   Quality:   Severity:  /10 (self-reported pain score)  Note: Reported level is compatible with observation.                   Effect on ADL:   Timing:   Modifying factors:    Further details on both, my assessment(s), as well as the proposed treatment plan, please see below.  Laboratory Chemistry  Inflammation Markers (CRP: Acute Phase) (ESR: Chronic Phase) Lab Results  Component Value Date   CRP 0.5 04/05/2015   ESRSEDRATE 37 (H) 04/05/2015                 Renal Function Markers Lab Results  Component Value Date   BUN 11 01/27/2016   CREATININE 0.85 01/27/2016   GFRAA 89 01/27/2016   GFRNONAA 77 01/27/2016                 Hepatic Function Markers Lab Results  Component Value Date   AST 15 01/27/2016   ALT 8 01/27/2016   ALBUMIN 4.3 01/27/2016   ALKPHOS 118 (H) 01/27/2016                 Electrolytes Lab Results  Component Value Date   NA 138 01/27/2016   K 4.4 01/27/2016   CL 99 01/27/2016   CALCIUM 9.5 01/27/2016   MG 2.1 04/05/2015                 Neuropathy Markers No results found for: [id number]  Bone Pathology Markers Lab Results  Component Value Date   ALKPHOS 118 (H) 01/27/2016   CALCIUM 9.5 01/27/2016                 Coagulation Parameters Lab Results  Component Value Date   INR 1.05 03/06/2015   LABPROT 13.9 03/06/2015   APTT 33 03/06/2015   PLT 305 01/27/2016                 Cardiovascular Markers Lab Results  Component Value Date   HGB 13.1 01/27/2016   HCT 38.4 01/27/2016                 Note: Lab results reviewed.  Recent Diagnostic Imaging Review  Cervical  Imaging: Cervical MR wo contrast:  Results for orders placed during the hospital encounter of 04/16/13  MR Cervical Spine Wo Contrast   Narrative CLINICAL DATA:  Right-sided weakness and numbness post anterior and posterior cervical fusion.  EXAM: MRI CERVICAL SPINE WITHOUT CONTRAST  TECHNIQUE: Multiplanar, multisequence MR imaging was performed. No intravenous contrast was administered.  COMPARISON:  Prior CT performed earlier on the same day and.  FINDINGS: The visualized portions of the brain and posterior fossa demonstrate a normal appearance with normal signal intensity. The craniocervical junction is widely patent.  The vertebral bodies are normally aligned with preservation of the normal cervical lordosis. Vertebral body heights are preserved.  Postoperative changes from recent C5 corpectomy with anterior plate screw fixation at C4 through C6. Posterior spinal fusion with bilateral transpedicular screws at C4 and C6 are present as well. The hardware appears well positioned without evidence of complication. Hyperintense T2 signal intensity within the posterior paraspinous soft tissues at the level of C3 through C6 is compatible likely postoperative edema. Small amount of fluid is also seen within the prevertebral soft tissues anterior to the cervical spine, also compatible with postoperative edema/effusion. No loculated fluid collection identified. No epidural hematoma. The cervical spinal cord itself demonstrates a normal appearance with normal signal intensity without evidence of injury, although evaluation at the level of the cervical fusion is somewhat limited due to susceptibility artifact. No significant canal or neural foraminal stenosis identified at the C4 through C6 levels.  At C2-3, there is no significant canal or neural foraminal stenosis.  At C3-4, no significant degenerative disc disease. No canal or neural foraminal stenosis.  At C4-5, there is mild  degenerative osteophytosis without significant canal or neural foraminal stenosis. No evidence of postoperative complication.  At C5-6, there is mild diffuse degenerative disc osteophytosis without significant canal or neural foraminal stenosis. No evidence of postoperative complication.  At C6-7, there is mild diffuse degenerative osteophytosis without significant canal or neural foraminal stenosis. No evidence of postoperative complication.  At C7-T1, minimal degenerative disc osteophyte and bilateral facet arthrosis. No canal or neural foraminal stenosis.  IMPRESSION: 1. Postoperative sequelae of recent cervical spinal fusion without complication. No evidence of epidural hematoma, cord injury, or significant canal or foraminal stenosis identified. 2. Hyperintense T2 signal intensity within the prevertebral soft tissues anterior the cervical spine as well as within the posterior is paraspinous soft tissues at C3 through C6, likely related to recent surgery. No loculated fluid collection identified.  Critical Value/emergent results were called by telephone at the time of interpretation on 04/17/2013 at 1:14 AM to Dr. Melina Schools , who verbally acknowledged these results.   Electronically Signed   By: Jeannine Boga M.D.   On: 04/17/2013 01:16    Cervical MR w/wo  contrast:  Results for orders placed during the hospital encounter of 08/21/11  MR Cervical Spine W Wo Contrast   Addendum **ADDENDUM** CREATED: 08/21/2011 15:13:15  The original report was by Dr. Van Clines.  The following addendum is by Dr. Van Clines:  "BUN and creatinine were obtained on site at [place] at [address]  Test results for your reference are:   Creatinine: 0.6   Bun:        7  **END ADDENDUM** SIGNED BY: Lynnell Chad. Janeece Fitting, M.D.      Carron Curie, MD 08/21/2011  3:15 PM         Narrative *RADIOLOGY REPORT*  Clinical Data: Neck pain.  Headaches.  Right  arm pain.  Prior cervical spine surgery.  MRI CERVICAL SPINE WITHOUT AND WITH CONTRAST  Technique:  Multiplanar and multiecho pulse sequences of the cervical spine, to include the craniocervical junction and cervicothoracic junction, were obtained according to standard protocol without and with intravenous contrast.  Contrast: 59m MULTIHANCE GADOBENATE DIMEGLUMINE 529 MG/ML IV SOLN  Comparison: Multiple exams, including 08/14/2011 and 04/22/2010  Findings: The craniocervical junction appears unremarkable.  Metal artifact associated with plate screw fixator noted at C6-7.  No significant vertebral subluxation.  Low-level vertebral edema is present in the C5 vertebral body, increased from the prior exam.  No significant abnormal cord signal is identified.  There is 2 mm of degenerative anterior subluxation of C3-4 and C4- 5.  Additional findings at individual levels are as follows:  C2-3:  No impingement.  The right facet joint is probably fused.  C3-4:  Mild right foraminal stenosis secondary to facet and uncinate spurring.  C4-5: Borderline right foraminal stenosis secondary to facet and uncinate spurring. Disc bulge noted.  C5-6:  Mild right foraminal stenosis due to facet and uncinate spurring.  C6-7:  Mild right foraminal stenosis due to uncinate spurring. This level is fused.  C7-T1:  Unremarkable.  T1-2:  Unremarkable.  IMPRESSION:  1.  Mild right-sided foraminal impingement at C2-4, C5-6, and C6-7 primarily due to spurring. 2.  Low-level edema in the C5 vertebral body is probably related to degenerative endplate findings at CT0-1  Original Report Authenticated By: WCarron Curie M.D.   Cervical CT wo contrast:  Results for orders placed during the hospital encounter of 02/28/13  CT Cervical Spine Wo Contrast   Narrative CLINICAL DATA:  Dysphagia with bilateral arm numbness. Patient underwent cervical fusion 10/30/2012 and fell five days  ago.  EXAM: CT CERVICAL SPINE WITHOUT CONTRAST  TECHNIQUE: Multidetector CT imaging of the cervical spine was performed without intravenous contrast. Multiplanar CT image reconstructions were also generated.  COMPARISON:  DG CERVICAL SPINE 2-3 VIEWS dated 10/30/2012; DG C-ARM 1-60 MIN dated 10/30/2012; MR C SPINE WO/W CM dated 08/21/2011  FINDINGS: Patient underwent revision of cervical fusion 10/30/2012 with C5-6 ACDF. Both C5 screws are now superiorly displaced into the C4-5 disc. There is lucency surrounding the C5 screws. The C6 screws are intact and well positioned. There is no displacement of the anterior plate or interbody metallic spacer. No solid interbody fusion is present at C5-6.  There is solid interbody fusion at C6-7. The C7 vertebral body screws have been removed.  The alignment is normal. There is no evidence of acute fracture or paraspinal hematoma.  IMPRESSION: 1. Both C5 screws are now superiorly displaced into the C4-5 disc space, a change from the intraoperative radiographs. The anterior plate and interbody spacer are stable in position. 2. No solid  interbody fusion at C5-6. 3. Previous C6-7 fusion appears solid. 4. No evidence acute fracture or malalignment.   Electronically Signed   By: Roxy Horseman M.D.   On: 02/28/2013 15:00    Cervical DG 2-3 views:  Results for orders placed during the hospital encounter of 07/20/10  DG Cervical Spine 2-3 Views   Narrative *RADIOLOGY REPORT*  Clinical Data: Status post C6-7 ACDF  CERVICAL SPINE - 2-3 VIEW  Comparison: Intraoperative imaging  Findings: The superior and midportions of the ACDF hardware at C6- C7 are within normal limits.  The inferior portion of the hardware is obscured by soft tissue artifact.  Allowing for this, there is no evidence for hardware failure.  Minimal reversal of the cervical lordosis at C4-C5 is noted.  IMPRESSION: No visualized hardware abnormality, allowing for  incomplete visualization of the inferior aspect of the hardware on the lateral view.  Original Report Authenticated By: Harrel Lemon, M.D.   Cervical DG complete:  Results for orders placed in visit on 05/03/07  DG Cervical Spine Complete   Narrative * PRIOR REPORT IMPORTED FROM AN EXTERNAL SYSTEM *   PRIOR REPORT IMPORTED FROM THE [facility] WORKFLOW SYSTEM   REASON FOR EXAM:    shoulder pain  COMMENTS:   PROCEDURE:     MDR - MDR CERVICAL SPINE COMPLETE  - May 03 2007 12:38PM   RESULT:     The vertebral body heights and the intervertebral disc spaces  are well maintained. The vertebral body alignment is normal. Oblique views  show mild spur impingement on the neural foramina at C6-C7 on the right.  The  neural foramina otherwise are widely patent bilaterally. The odontoid  process is intact.   IMPRESSION:  1. No fracture is seen.  2. Oblique views show slight spur impingement on the neural foramina at  C6-C7 on the right.       Shoulder Imaging: Shoulder-R MR wo contrast:  Results for orders placed in visit on 10/19/15  MR Shoulder Right Wo Contrast   Shoulder-L MR wo contrast:  Results for orders placed in visit on 10/19/15  MR Shoulder Left Wo Contrast   Shoulder-R DG:  Results for orders placed in visit on 05/03/07  DG Shoulder Right   Narrative * PRIOR REPORT IMPORTED FROM AN EXTERNAL SYSTEM *   PRIOR REPORT IMPORTED FROM THE [facility] WORKFLOW SYSTEM   REASON FOR EXAM:    shoulder pain  COMMENTS:   PROCEDURE:     MDR - MDR SHOULDER RIGHT COMPLETE  - May 03 2007 12:27PM   RESULT:     No fracture, dislocation or other significant osseous  abnormality is identified.   IMPRESSION:  1. No significant abnormalities are noted.       Thoracic Imaging: Thoracic CT wo contrast:  Results for orders placed during the hospital encounter of 01/08/08  CT Thoracic Spine Wo Contrast   Narrative Clinical Data: T10-L4 fusion.  Back and leg pain.   CT THORACIC SPINE  WITHOUT CONTRAST   Technique:  Multidetector CT imaging of the thoracic spine was performed without intravenous contrast administration. Multiplanar CT image reconstructions were also generated   Comparison: Multiple lumbar radiographs and CT examinations earlier this year.   Findings: Posterior fusion bone graft material is present beginning at the T8-9 level bilaterally extending through the operative region.   From T10-L4, there has been placement of bilateral pedicle screws and posterior rods with two crossing bars.  At T10, the pedicle screws are somewhat laterally positioned,  more on the right than the left.  No canal encroachment.  Slight lateral positioning is also noted at T11.  No canal encroachment.  Below that, good pedicular  location is achieved at each level without encroachment upon any of the neural spaces.  Nothing encroaches upon the spinal canal at any level.   Changes related to previous anterior discectomy and fusion at L2-3 and L3-4 appear stable.   IMPRESSION: Posterior fusion from T10-L4 with pedicle screws, posterior rods and posterior fusion bone graft placement.  No apparent operative complication.  See above discussion.  Provider: Wilburt Finlay   Lumbosacral Imaging: Lumbar CT wo contrast:  Results for orders placed during the hospital encounter of 01/08/08  CT Lumbar Spine Wo Contrast   Narrative Clinical Data: Scoliosis and back pain.  A L I F L2-3 and L3-4.   CT LUMBAR SPINE WITHOUT CONTRAST   Technique:  Multidetector CT imaging of the lumbar spine was performed without intravenous contrast administration. Multiplanar CT image reconstructions were also generated.   Comparison: Operative images 10/28.  Previous CT 08/24.   Findings: T12-L1:  Unremarkable.   L1-2:  Unremarkable.   L2-3:  There has been fusion at this level from an anterior approach.  Interbody fusion material appears well positioned. Spinal canal and foramina appear  widely patent.   L3-4:  There has been fusion at this level from an anterior approach.  Interbody fusion material appears well positioned. Canal and foramina appear widely patent.   L4-5:  Minimal disc bulge but no herniation or stenosis.   L5-S1:  Disc bulge slightly more towards the left.  Mild facet degeneration bilaterally.  No definite neural compression.   One can appreciate changes related to the retroperitoneal approach on the left.  Multiple surgical clips are present.  There is mild retroperitoneal edema as expected.  Note is made that the left kidney is larger than the right.  This is presumably chronic.  The left renal collecting system is slightly full but I do not see definite hydronephrosis.   IMPRESSION: Discectomy and fusion from an anterior approach at L2-3 and L3-4. See above discussion.  Provider: Wilburt Finlay   Lumbar CT w contrast:  Results for orders placed during the hospital encounter of 10/08/12  CT Lumbar Spine W Contrast   Narrative *RADIOLOGY REPORT*  Clinical Data:  Back pain  CT MYELOGRAPHY LUMBAR SPINE  Technique:  CT imaging of the lumbar spine was performed after intrathecal contrast administration.  Multiplanar CT image reconstructions were also generated.  Comparison:  01/10/2008  Findings: Radiologist injection.  Anatomic alignment of the vertebral bodies.  Bilateral pedicle screws are in place extending from L4 into the thoracic spine with cross stabilizing bars.  Solid bony callus along the posterior elements is noted.  Disc spacers are in place at L2-3 and L3-4 with solid trans disc space effusion.  No breakage or loosening of the hardware.  The conus medullaris terminates at the L1-2 disc.  No new vertebral compression deformity.  Wedging at L3 is stable.  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  L1-2:  Fused level.  Central canal and foramina are widely patent.  L2-3:  Fused level.   Central canal and foramina are widely patent.  L3-4.  Fused level.  Central canal and foramina are widely patent.  L4-5:  Mild facet arthropathy and ligamentum flavum hypertrophy. Mild concentric disc bulge.  No significant central or lateral recess narrowing foramina patent.  L5 S1:  Shallow central protrusion.  No lateral recess or central canal narrowing.  Patent foramina.  Very minimal facet arthropathy.  IMPRESSION: Solid thoracolumbar fusion through L4.  Mild degenerative disc disease at L4-5 and L5 S1 without evidence of impingement.  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   Original Report Authenticated By: Marybelle Killings, M.D.    Lumbar DG 2-3 views:  Results for orders placed during the hospital encounter of 01/08/08  DG Lumbar Spine 2-3 Views   Narrative Clinical Data: Degenerative scoliosis with back pain.   LUMBAR SPINE - 2-3 VIEW   Comparison: 01/02/2008.   Findings: Two intraoperative prone views of the thoracolumbar junction are submitted.  Both films show an esophageal temperature probe and NG tube in place.  The patient has extensive thoracolumbar fusion beginning at the level of T10 (assuming 12 rib- bearing vertebral bodies) and extending to at least the level of L4.  The left-sided fusion rod ends at the L4 pedicle screw, but the caudal most extent of the right-sided hardware has not been visualized.  Intervertebral graft material is seen at L2-3 and L3- 4.   IMPRESSION: Extensive thoracolumbar fusion with incomplete visualization of the right-sided spinal hardware.  No evidence for immediate hardware complications within the visualized anatomy.  Provider: Roxan Hockey Ward   Lumbar DG (Complete) 4+V:  Results for orders placed in visit on 05/03/07  DG Lumbar Spine Complete   Narrative * PRIOR REPORT IMPORTED FROM AN EXTERNAL SYSTEM *   PRIOR REPORT IMPORTED FROM THE [facility] [place] EXAM:     shoulder pain  COMMENTS:   PROCEDURE:     MDR - MDR LUMBAR SPINE WITH OBLIQUES  - May 03 2007 12:34PM   RESULT:     The vertebral body heights and the intervertebral disc spaces  are well maintained. The vertebral body alignment is normal. Oblique views  show no significant abnormalities of the articular facets. There is mild  degenerative spurring anteriorly at multiple levels. The pedicles are  bilaterally intact. There is a 15 degrees lumbar scoliosis with convexity  to  the right.   IMPRESSION:  1. No fracture or other acute bony abnormality is identified.  2. There is a mild lumbar scoliosis.       Lumbar DG Myelogram views:  Results for orders placed during the hospital encounter of 10/08/12  DG Myelogram Lumbar   Narrative *RADIOLOGY REPORT*  Clinical Data:  Back pain  MYELOGRAM LUMBAR  Technique: The procedure, risks, benefits, and alternatives were explained to the patient. The patient understands and consents. Under fluoroscopic guidance, a 22 gauge spinal needle was placed in the CSF space via left L5-S1 approach. 20 mL of Omnipaque 180 was injected.  Findings: Bilateral pedicle screws and cross stabilizing bars are present in L4, and L3, L2, L1, T12, T11, and above the upper limit of the study.  There is no breakage or loosening of the hardware. There is also anatomic alignment.   Mild wedging at L3 is stable compared with March 21, 2007.  Disc spacers are in place in the interposed   levels at L2-3 and L3-4.  There is very minimal anterior epidural mass effect at L4-5.  No mass effect at L5-S1.  The central canal is widely patent throughout the lumbar spine.  Flexion and extension views demonstrate no evidence of abnormal motion to suggest instability. There is very limited range of motion in the lumbar spine.  Complications: None.  Fluoroscopy Time:  1 minute and 8 seconds.  IMPRESSION: Thoracolumbar fusion as described.  No evidence of spinal  stenosis or hardware failure.   Original Report Authenticated By: Marybelle Killings, M.D.    Hip Imaging: Hip-R MR w contrast:  Results for orders placed during the hospital encounter of 07/26/11  MR Hip Right W Contrast   Narrative *RADIOLOGY REPORT*  Clinical Data: History of torn right labrum 3 years ago.  History of labral repair.  Recurrent hip and groin pain.  MRI WITH INFUSION OF THE RIGHT HIP (MR ARTHROGRAM)  Technique:  Multiplanar, multisequence MR imaging of right hip was performed following the administration of intraarticular contrast.  Comparison: 07/18/2010.  Findings:  There is recurrent tear of the anterior superior labrum of the right hip.  This is at the 10 o'clock position of the acetabular labrum.  Contrast tracks between the acetabulum and labral soft tissue structures.  Minimal chondromalacia is present.  When comparing today's examination to the prior exam of 07/18/2010, the labrum is not completely detached at the 10 o'clock position. The visceral pelvis appears within normal limits.  IMPRESSION: Recurrent anterior superior labral tear at the 10 o'clock position. Mild right hip osteoarthritis.  Original Report Authenticated By: Dereck Ligas, M.D.   Note: Results of ordered imaging test(s) reviewed and explained to patient in Layman's terms. Copy of results provided to patient  Meds   Current Outpatient Prescriptions:  .  albuterol (PROVENTIL HFA;VENTOLIN HFA) 108 (90 BASE) MCG/ACT inhaler, Inhale 2 puffs into the lungs every 6 (six) hours as needed for wheezing., Disp: , Rfl:  .  ALPRAZolam (XANAX) 1 MG tablet, TAKE 1/2 TAB BY MOUTH EVERY MORNING, 1/2 TAB AT LUNCH, AND 2 TABS AT BEDTIME, Disp: 90 tablet, Rfl: 5 .  buPROPion (WELLBUTRIN SR) 150 MG 12 hr tablet, Take 1 tablet (150 mg total) by mouth 2 (two) times daily., Disp: 180 tablet, Rfl: 1 .  escitalopram (LEXAPRO) 10 MG tablet, Take 1 tablet (10 mg total) by mouth at bedtime. Take 1/2 tab PO q  h.s. X 1 week then increase to 1 tab PO q h.s., Disp: 90 tablet, Rfl: 3 .  fluticasone (FLONASE) 50 MCG/ACT nasal spray, Place 2 sprays into both nostrils daily., Disp: 16 g, Rfl: 3 .  Fluticasone-Salmeterol (ADVAIR DISKUS) 500-50 MCG/DOSE AEPB, Inhale 1 puff into the lungs 2 (two) times daily., Disp: 60 each, Rfl: 5 .  loratadine (CLARITIN REDITABS) 10 MG dissolvable tablet, Take 1 tablet (10 mg total) by mouth daily., Disp: 90 tablet, Rfl: 3 .  metoprolol succinate (TOPROL-XL) 50 MG 24 hr tablet, TAKE 1 TABLET BY MOUTH EVERY DAY, Disp: 90 tablet, Rfl: 1 .  montelukast (SINGULAIR) 10 MG tablet, TAKE 1 TABLET (10 MG TOTAL) BY MOUTH DAILY., Disp: 90 tablet, Rfl: 1 .  omeprazole (PRILOSEC) 20 MG capsule, TAKE ONE CAPSULE BY MOUTH TWICE A DAY, Disp: 60 capsule, Rfl: 5 .  ondansetron (ZOFRAN) 4 MG tablet, Take 1 tablet (4 mg total) by mouth every 8 (eight) hours as needed for nausea or vomiting., Disp: 20 tablet, Rfl: 0 .  Spacer/Aero-Holding Chambers (AEROCHAMBER MV) inhaler, by Does not apply route., Disp: , Rfl:  .  valACYclovir (VALTREX) 1000 MG tablet, TAKE TWO TABLETS BY MOUTH TWICE DAILY FOR ONE DAY FEVER BLISTER, Disp: 4 tablet, Rfl: 5  ROS  Constitutional: Denies any fever or chills Gastrointestinal: No reported hemesis, hematochezia, vomiting, or acute GI distress Musculoskeletal: Denies any acute onset joint swelling, redness, loss of ROM, or weakness Neurological: No reported episodes  of acute onset apraxia, aphasia, dysarthria, agnosia, amnesia, paralysis, loss of coordination, or loss of consciousness  Allergies  Ms. Adolph is allergic to amoxicillin; clindamycin; codeine; sulfa antibiotics; shellfish allergy; decadron [dexamethasone]; papaya derivatives; prednisone; betadine [povidone iodine]; chlorhexidine; clarithromycin; clindamycin/lincomycin; and other.  [place]  Drug: Ms. Cagley  reports that she does not use drugs. Alcohol:  reports that she does not drink alcohol. Tobacco:   reports that she has never smoked. She has never used smokeless tobacco. 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); Anemia; Anxiety; Asthma; Cephalalgia (08/18/2014); Cervical disc disease (08/18/2014); Chronic headaches; DDD (degenerative disc disease); Depression; Dizziness (04/22/2013); Duodenal ulcer with hemorrhage and perforation ([place]) (04/27/2003); GERD (gastroesophageal reflux disease); H/O arthrodesis (08/18/2014); Headache(784.0); History of blood transfusion; History of cervical spinal surgery (01/04/2015); History of kidney stones; Hypertension; Inverted T wave; Narrowing of intervertebral disc space (08/18/2014); Orthostatic hypotension (04/22/2013); Pain; Pneumonia; PONV (postoperative nausea and vomiting); Postop Hyponatremia (05/14/2012); Postoperative anemia due to acute blood loss (05/14/2012); Right foot drop; and Right hip arthralgia (08/18/2014). Surgical: Ms. Schnapp  has a past surgical history that includes RIGHT HIP ARTHROSCOPY FOR LABRAL TEAR (ABOUT 2010); Anterior fusion cervical spine (MAY 2012); Nasal septum surgery (MARCH 2013); Back surgery (2009); Tonsillectomy; Abdominal hysterectomy; DIAGNOSTIC LAPAROSCOPIES - MULTIPLE FOR ENDOMETRIOSIS; Cholecystectomy; Hip arthroscopy (09/20/2011); Shoulder arthroscopy (05-06-12); Carpal tunnel release (05-06-12); Total hip arthroplasty (Right, 05/13/2012); Anterior cervical decomp/discectomy fusion (N/A, 10/30/2012); Posterior cervical fusion/foraminotomy (N/A, 04/16/2013); Breast biopsy (Right, 2008); and Breast excisional biopsy (Left, 1998). Family: family history includes Aneurysm in her maternal grandmother and mother; Breast cancer (age of onset: 35) in her paternal grandmother.  Constitutional Exam  General appearance: Well nourished, well developed, and well hydrated. In no apparent acute distress There were no vitals filed for this visit. BMI Assessment: Estimated body mass index is 26.09 kg/m as calculated from the  following:   Height as of 10/11/16: _0  (1.626 m).   Weight as of 10/27/16: 152 lb (68.9 kg).  BMI interpretation table: BMI level Category Range association with higher incidence of chronic pain  <18 kg/m2 Underweight   18.5-24.9 kg/m2 Ideal body weight   25-29.9 kg/m2 Overweight Increased incidence by 20%  30-34.9 kg/m2 Obese (Class I) Increased incidence by 68%  35-39.9 kg/m2 Severe obesity (Class II) Increased incidence by 136%  >40 kg/m2 Extreme obesity (Class III) Increased incidence by 254%   BMI Readings from Last 4 Encounters:  10/27/16 26.09 kg/m  10/11/16 24.03 kg/m  08/25/16 25.40 kg/m  07/13/16 25.75 kg/m   Wt Readings from Last 4 Encounters:  10/27/16 152 lb (68.9 kg)  10/11/16 140 lb (63.5 kg)  08/25/16 148 lb (67.1 kg)  07/13/16 150 lb (68 kg)  Psych/Mental status: Alert, oriented x 3 (person, place, & time)       Eyes: PERLA Respiratory: No evidence of acute respiratory distress  Cervical Spine Area Exam  Skin & Axial Inspection: No masses, redness, edema, swelling, or associated skin lesions Alignment: Symmetrical Functional ROM: Unrestricted ROM      Stability: No instability detected Muscle Tone/Strength: Functionally intact. No obvious neuro-muscular anomalies detected. Sensory (Neurological): Unimpaired Palpation: No palpable anomalies              Upper Extremity (UE) Exam    Side: Right upper extremity  Side: Left upper extremity  Skin & Extremity Inspection: Skin color, temperature, and hair growth are WNL. No peripheral edema or cyanosis. No masses, redness, swelling, asymmetry, or associated skin lesions. No contractures.  Skin & Extremity Inspection: Skin color, temperature, and hair growth are WNL. No peripheral edema or cyanosis. No masses, redness, swelling, asymmetry, or associated skin lesions. No contractures.  Functional ROM: Unrestricted ROM          Functional ROM: Unrestricted ROM          Muscle Tone/Strength: Functionally intact. No  obvious neuro-muscular anomalies detected.  Muscle Tone/Strength: Functionally intact. No obvious neuro-muscular anomalies detected.  Sensory (Neurological): Unimpaired          Sensory (Neurological): Unimpaired          Palpation: No palpable anomalies              Palpation: No palpable anomalies              Specialized Test(s): Deferred         Specialized Test(s): Deferred          Thoracic Spine Area Exam  Skin & Axial Inspection: No masses, redness, or swelling Alignment: Symmetrical Functional ROM: Unrestricted ROM Stability: No instability detected Muscle Tone/Strength: Functionally intact. No obvious neuro-muscular anomalies detected. Sensory (Neurological): Unimpaired Muscle strength & Tone: No palpable anomalies  Lumbar Spine Area Exam  Skin & Axial Inspection: No masses, redness, or swelling Alignment: Symmetrical Functional ROM: Unrestricted ROM      Stability: No instability detected Muscle Tone/Strength: Functionally intact. No obvious neuro-muscular anomalies detected. Sensory (Neurological): Unimpaired Palpation: No palpable anomalies       Provocative Tests: Lumbar Hyperextension and rotation test: evaluation deferred today       Lumbar Lateral bending test: evaluation deferred today       Patrick's Maneuver: evaluation deferred today                    Gait & Posture Assessment  Ambulation: Unassisted Gait: Relatively normal for age and body habitus Posture: WNL   Lower Extremity Exam    Side: Right lower extremity  Side: Left lower extremity  Skin & Extremity Inspection: Skin color, temperature, and hair growth are WNL. No peripheral edema or cyanosis. No masses, redness, swelling, asymmetry, or associated skin lesions. No contractures.  Skin & Extremity Inspection: Skin color, temperature, and hair growth are WNL. No peripheral edema or cyanosis. No masses, redness, swelling, asymmetry, or associated skin lesions. No contractures.  Functional ROM:  Unrestricted ROM          Functional ROM: Unrestricted ROM          Muscle Tone/Strength: Functionally intact. No obvious neuro-muscular anomalies detected.  Muscle Tone/Strength: Functionally intact. No obvious neuro-muscular anomalies detected.  Sensory (Neurological): Unimpaired  Sensory (Neurological): Unimpaired  Palpation: No palpable anomalies  Palpation: No palpable anomalies   Assessment  Primary Diagnosis & Pertinent Problem List: The primary encounter diagnosis was Chronic hip pain (Primary Source of Pain) (Bilateral) (L>R). Diagnoses of Chronic shoulder pain (Secondary source of pain) (Bilateral) (L>R) and Chronic low back pain [facility] source of pain) (Bilateral) (L>R) were also pertinent to this visit.  Status Diagnosis  Controlled Controlled Controlled 1. Chronic hip pain (Primary Source of Pain) (Bilateral) (L>R)   2. Chronic shoulder pain (Secondary source of pain) (Bilateral) (L>R)   3. Chronic low back pain [facility] source of pain) (Bilateral) (L>R)     Problems updated and reviewed during this visit: No problems updated. Plan of Care  Pharmacotherapy (Medications Ordered): No orders of the defined types were placed in this encounter.  New Prescriptions   No medications on file   Medications  administered today: Ms. Flippo had no medications administered during this visit.  Procedure Orders    No procedure(s) ordered today   Lab Orders  No laboratory test(s) ordered today   Imaging Orders  No imaging studies ordered today   Referral Orders  No referral(s) requested today    Interventional management options: Planned, scheduled, and/or pending:   Therapeutic bilateral suprascapular nerve RFA, starting with the left side    Considering:   Palliative bilateral suprascapular nerve block  Possible bilateral suprascapular nerve RFA    Palliative PRN treatment(s):   To be determined at a later time   Provider-requested follow-up: No Follow-up on  file.  Future Appointments Date Time Provider [place]  11/27/2016 1:15 PM Milinda Pointer, MD [facility] None  12/07/2016 10:00 AM [facility]SE HEALTH ADVISOR [facility] None  12/07/2016 11:00 AM Brita Romp, Dionne Bucy, MD [facility] None  01/04/2017 10:30 AM Milinda Pointer, MD [facility] None   Primary Care Physician: Jerrol Banana., MD Location: [facility] Note by: Gaspar Cola, MD Date: 11/27/2016; Time: 7:25 AM

## 2016-11-30 ENCOUNTER — Ambulatory Visit: Payer: Medicare Other | Admitting: Physician Assistant

## 2016-12-05 NOTE — Progress Notes (Deleted)
Patient's Name: [name]  MRN: [id number]  Referring Provider: Jerrol Banana.,*  DOB: 1960-04-03  PCP: Jerrol Banana., MD  DOS: 12/06/2016  Note by: Gaspar Cola, MD  Service setting: Ambulatory outpatient  Specialty: Interventional Pain Management  Location: [facility] (AMB) Pain Management Facility    Patient type: Established   Primary Reason(s) for Visit: Evaluation of chronic illnesses with exacerbation, or progression (Level of risk: moderate) CC: No chief complaint on file.  HPI  [name] is a 56 y.o. year old, female patient, who comes today for a follow-up evaluation. She has Chronic neck pain; Anxiety state; Asthma, mild; Blurred vision; Benign paroxysmal positional nystagmus; Congenital renal agenesis and dysgenesis; Clinical depression; Colon, diverticulosis; Fatigue; Cannot sleep; IBS (irritable bowel syndrome); Awareness of heartbeats; RAD (reactive airway disease); Apnea, sleep; Esophagitis, reflux; DOE (dyspnea on exertion); Chronic left shoulder pain; Chronic right hip pain; Encounter for therapeutic drug level monitoring; Long term current use of opiate analgesic; Long term prescription opiate use; Opiate use (60 MME/Day); Chronic pain syndrome; Steroid intolerance; Cervical spine pain; Cervical spondylosis; Cervical facet arthropathy (bilateral); History of cervical spinal surgery; Chronic shoulder pain (Secondary source of pain) (Bilateral) (L>R); 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 [name] was last seen on 11/27/2016. Her primarily concern today is the No chief complaint on file.  Pain Assessment: Location:     Radiating:   Onset:   Duration:   Quality:   Severity:  /10 (self-reported pain score)  Note: Reported level is compatible with observation.                   When using our objective Pain Scale, any level above a 5/10 is said to belong in an emergency room, as it progressively worsens from a 6/10, which is described as severely limiting. Requiring emergency care not usually available at an outpatient pain management facility. Communication becomes difficult and requires great effort. Assistance to reach the emergency department may be required. Facial flushing and profuse sweating along with potentially dangerous increases in heart rate and blood pressure will be evident. Effect on ADL:   Timing:   Modifying factors:    Further details on both, my assessment(s), as well as the proposed treatment plan, please see below.  Laboratory Chemistry  Inflammation Markers (CRP: Acute Phase) (ESR: Chronic Phase) Lab Results  Component Value Date   CRP 0.5 04/05/2015   ESRSEDRATE 37 (H) 04/05/2015                 Renal Function Markers Lab Results  Component Value Date   BUN 11 01/27/2016   CREATININE 0.85 01/27/2016   GFRAA 89 01/27/2016   GFRNONAA 77 01/27/2016                 Hepatic Function Markers Lab Results  Component Value Date   AST 15 01/27/2016   ALT 8 01/27/2016   ALBUMIN 4.3 01/27/2016   ALKPHOS 118 (H) 01/27/2016  Electrolytes Lab Results  Component Value Date   NA 138 01/27/2016   K 4.4 01/27/2016   CL 99 01/27/2016   CALCIUM 9.5 01/27/2016   MG 2.1 04/05/2015                 Neuropathy Markers No results found for: [id number]                Bone Pathology Markers Lab Results  Component Value Date   ALKPHOS 118 (H) 01/27/2016   CALCIUM 9.5 01/27/2016                 Coagulation Parameters Lab Results  Component Value Date   INR 1.05 03/06/2015   LABPROT 13.9 03/06/2015   APTT 33 03/06/2015   PLT 305 01/27/2016                 Cardiovascular Markers Lab Results  Component Value Date   HGB 13.1 01/27/2016   HCT 38.4 01/27/2016                 Note: Lab results reviewed.  Recent Diagnostic Imaging Review  Cervical Imaging: Cervical MR wo contrast:  Results for orders placed during the hospital encounter of 04/16/13  MR Cervical Spine Wo Contrast   Narrative CLINICAL DATA:  Right-sided weakness and numbness post anterior and posterior cervical fusion.  EXAM: MRI CERVICAL SPINE WITHOUT CONTRAST  TECHNIQUE: Multiplanar, multisequence MR imaging was performed. No intravenous contrast was administered.  COMPARISON:  Prior CT performed earlier on the same day and.  FINDINGS: The visualized portions of the brain and posterior fossa demonstrate a normal appearance with normal signal intensity. The craniocervical junction is widely patent.  The vertebral bodies are normally aligned with preservation of the normal cervical lordosis. Vertebral body heights are preserved.  Postoperative changes from recent C5 corpectomy with anterior plate screw fixation at C4 through C6. Posterior spinal fusion with bilateral transpedicular screws at C4 and C6 are present as well. The hardware appears well positioned without evidence of complication. Hyperintense T2 signal intensity within the posterior paraspinous soft tissues at the level of C3 through C6 is compatible likely postoperative edema. Small amount of fluid is also seen within the prevertebral soft tissues anterior to the cervical spine, also compatible with postoperative edema/effusion. No loculated fluid collection identified. No epidural hematoma. The  cervical spinal cord itself demonstrates a normal appearance with normal signal intensity without evidence of injury, although evaluation at the level of the cervical fusion is somewhat limited due to susceptibility artifact. No significant canal or neural foraminal stenosis identified at the C4 through C6 levels.  At C2-3, there is no significant canal or neural foraminal stenosis.  At C3-4, no significant degenerative disc disease. No canal or neural foraminal stenosis.  At C4-5, there is mild degenerative osteophytosis without significant canal or neural foraminal stenosis. No evidence of postoperative complication.  At C5-6, there is mild diffuse degenerative disc osteophytosis without significant canal or neural foraminal stenosis. No evidence of postoperative complication.  At C6-7, there is mild diffuse degenerative osteophytosis without significant canal or neural foraminal stenosis. No evidence of postoperative complication.  At C7-T1, minimal degenerative disc osteophyte and bilateral facet arthrosis. No canal or neural foraminal stenosis.  IMPRESSION: 1. Postoperative sequelae of recent cervical spinal fusion without complication. No evidence of epidural hematoma, cord injury, or significant canal or foraminal stenosis identified. 2. Hyperintense T2 signal intensity within the prevertebral soft tissues anterior the cervical spine as well as within the  posterior is paraspinous soft tissues at C3 through C6, likely related to recent surgery. No loculated fluid collection identified.  Critical Value/emergent results were called by telephone at the time of interpretation on 04/17/2013 at 1:14 AM to Dr. Melina Schools , who verbally acknowledged these results.   Electronically Signed   By: Jeannine Boga M.D.   On: 04/17/2013 01:16    Cervical MR w/wo contrast:  Results for orders placed during the hospital encounter of 08/21/11  MR Cervical Spine W Wo  Contrast   Addendum **ADDENDUM** CREATED: 08/21/2011 15:13:15  The original report was by Dr. Van Clines.  The following addendum is by Dr. Van Clines:  "BUN and creatinine were obtained on site at [facility] at [address]  Test results for your reference are:   Creatinine: 0.6   Bun:        7  **END ADDENDUM** SIGNED BY: Lynnell Chad. Janeece Fitting, M.D.      Carron Curie, MD 08/21/2011  3:15 PM         Narrative *RADIOLOGY REPORT*  Clinical Data: Neck pain.  Headaches.  Right arm pain.  Prior cervical spine surgery.  MRI CERVICAL SPINE WITHOUT AND WITH CONTRAST  Technique:  Multiplanar and multiecho pulse sequences of the cervical spine, to include the craniocervical junction and cervicothoracic junction, were obtained according to standard protocol without and with intravenous contrast.  Contrast: 76m MULTIHANCE GADOBENATE DIMEGLUMINE 529 MG/ML IV SOLN  Comparison: Multiple exams, including 08/14/2011 and 04/22/2010  Findings: The craniocervical junction appears unremarkable.  Metal artifact associated with plate screw fixator noted at C6-7.  No significant vertebral subluxation.  Low-level vertebral edema is present in the C5 vertebral body, increased from the prior exam.  No significant abnormal cord signal is identified.  There is 2 mm of degenerative anterior subluxation of C3-4 and C4- 5.  Additional findings at individual levels are as follows:  C2-3:  No impingement.  The right facet joint is probably fused.  C3-4:  Mild right foraminal stenosis secondary to facet and uncinate spurring.  C4-5: Borderline right foraminal stenosis secondary to facet and uncinate spurring. Disc bulge noted.  C5-6:  Mild right foraminal stenosis due to facet and uncinate spurring.  C6-7:  Mild right foraminal stenosis due to uncinate spurring. This level is fused.  C7-T1:  Unremarkable.  T1-2:  Unremarkable.  IMPRESSION:  1.  Mild  right-sided foraminal impingement at C2-4, C5-6, and C6-7 primarily due to spurring. 2.  Low-level edema in the C5 vertebral body is probably related to degenerative endplate findings at CD9-8  Original Report Authenticated By: WCarron Curie M.D.   Cervical CT wo contrast:  Results for orders placed during the hospital encounter of 02/28/13  CT Cervical Spine Wo Contrast   Narrative CLINICAL DATA:  Dysphagia with bilateral arm numbness. Patient underwent cervical fusion 10/30/2012 and fell five days ago.  EXAM: CT CERVICAL SPINE WITHOUT CONTRAST  TECHNIQUE: Multidetector CT imaging of the cervical spine was performed without intravenous contrast. Multiplanar CT image reconstructions were also generated.  COMPARISON:  DG CERVICAL SPINE 2-3 VIEWS dated 10/30/2012; DG C-ARM 1-60 MIN dated 10/30/2012; MR C SPINE WO/W CM dated 08/21/2011  FINDINGS: Patient underwent revision of cervical fusion 10/30/2012 with C5-6 ACDF. Both C5 screws are now superiorly displaced into the C4-5 disc. There is lucency surrounding the C5 screws. The C6 screws are intact and well positioned. There is no displacement of the anterior plate or interbody metallic spacer. No solid interbody fusion  is present at C5-6.  There is solid interbody fusion at C6-7. The C7 vertebral body screws have been removed.  The alignment is normal. There is no evidence of acute fracture or paraspinal hematoma.  IMPRESSION: 1. Both C5 screws are now superiorly displaced into the C4-5 disc space, a change from the intraoperative radiographs. The anterior plate and interbody spacer are stable in position. 2. No solid interbody fusion at C5-6. 3. Previous C6-7 fusion appears solid. 4. No evidence acute fracture or malalignment.   Electronically Signed   By: Camie Patience M.D.   On: 02/28/2013 15:00    Cervical DG 2-3 views:  Results for orders placed during the hospital encounter of 07/20/10  DG Cervical Spine  2-3 Views   Narrative *RADIOLOGY REPORT*  Clinical Data: Status post C6-7 ACDF  CERVICAL SPINE - 2-3 VIEW  Comparison: Intraoperative imaging  Findings: The superior and midportions of the ACDF hardware at C6- C7 are within normal limits.  The inferior portion of the hardware is obscured by soft tissue artifact.  Allowing for this, there is no evidence for hardware failure.  Minimal reversal of the cervical lordosis at C4-C5 is noted.  IMPRESSION: No visualized hardware abnormality, allowing for incomplete visualization of the inferior aspect of the hardware on the lateral view.  Original Report Authenticated By: Arline Asp, M.D.   Cervical DG complete:  Results for orders placed in visit on 05/03/07  DG Cervical Spine Complete   Narrative * PRIOR REPORT IMPORTED FROM AN EXTERNAL SYSTEM *   PRIOR REPORT IMPORTED FROM THE [facility] WORKFLOW SYSTEM   REASON FOR EXAM:    shoulder pain  COMMENTS:   PROCEDURE:     MDR - MDR CERVICAL SPINE COMPLETE  - May 03 2007 12:38PM   RESULT:     The vertebral body heights and the intervertebral disc spaces  are well maintained. The vertebral body alignment is normal. Oblique views  show mild spur impingement on the neural foramina at C6-C7 on the right.  The  neural foramina otherwise are widely patent bilaterally. The odontoid  process is intact.   IMPRESSION:  1. No fracture is seen.  2. Oblique views show slight spur impingement on the neural foramina at  C6-C7 on the right.       Shoulder Imaging: Shoulder-R MR wo contrast:  Results for orders placed in visit on 10/19/15  MR Shoulder Right Wo Contrast   Shoulder-L MR wo contrast:  Results for orders placed in visit on 10/19/15  MR Shoulder Left Wo Contrast   Shoulder-R DG:  Results for orders placed in visit on 05/03/07  DG Shoulder Right   Narrative * PRIOR REPORT IMPORTED FROM AN EXTERNAL SYSTEM *   PRIOR REPORT IMPORTED FROM THE [facility] [place] EXAM:    shoulder pain  COMMENTS:   PROCEDURE:     MDR - MDR SHOULDER RIGHT COMPLETE  - May 03 2007 12:27PM   RESULT:     No fracture, dislocation or other significant osseous  abnormality is identified.   IMPRESSION:  1. No significant abnormalities are noted.       Thoracic Imaging: Thoracic CT wo contrast:  Results for orders placed during the hospital encounter of 01/08/08  CT Thoracic Spine Wo Contrast   Narrative Clinical Data: T10-L4 fusion.  Back and leg pain.   CT THORACIC SPINE WITHOUT CONTRAST   Technique:  Multidetector CT imaging of the thoracic spine was performed without intravenous contrast administration.  Multiplanar CT image reconstructions were also generated   Comparison: Multiple lumbar radiographs and CT examinations earlier this year.   Findings: Posterior fusion bone graft material is present beginning at the T8-9 level bilaterally extending through the operative region.   From T10-L4, there has been placement of bilateral pedicle screws and posterior rods with two crossing bars.  At T10, the pedicle screws are somewhat laterally positioned, more on the right than the left.  No canal encroachment.  Slight lateral positioning is also noted at T11.  No canal encroachment.  Below that, good pedicular  location is achieved at each level without encroachment upon any of the neural spaces.  Nothing encroaches upon the spinal canal at any level.   Changes related to previous anterior discectomy and fusion at L2-3 and L3-4 appear stable.   IMPRESSION: Posterior fusion from T10-L4 with pedicle screws, posterior rods and posterior fusion bone graft placement.  No apparent operative complication.  See above discussion.  Provider: Wilburt Finlay   Lumbosacral Imaging: Lumbar CT wo contrast:  Results for orders placed during the hospital encounter of 01/08/08  CT Lumbar Spine Wo Contrast   Narrative Clinical Data: Scoliosis and back pain.  A L I F  L2-3 and L3-4.   CT LUMBAR SPINE WITHOUT CONTRAST   Technique:  Multidetector CT imaging of the lumbar spine was performed without intravenous contrast administration. Multiplanar CT image reconstructions were also generated.   Comparison: Operative images 10/28.  Previous CT 08/24.   Findings: T12-L1:  Unremarkable.   L1-2:  Unremarkable.   L2-3:  There has been fusion at this level from an anterior approach.  Interbody fusion material appears well positioned. Spinal canal and foramina appear widely patent.   L3-4:  There has been fusion at this level from an anterior approach.  Interbody fusion material appears well positioned. Canal and foramina appear widely patent.   L4-5:  Minimal disc bulge but no herniation or stenosis.   L5-S1:  Disc bulge slightly more towards the left.  Mild facet degeneration bilaterally.  No definite neural compression.   One can appreciate changes related to the retroperitoneal approach on the left.  Multiple surgical clips are present.  There is mild retroperitoneal edema as expected.  Note is made that the left kidney is larger than the right.  This is presumably chronic.  The left renal collecting system is slightly full but I do not see definite hydronephrosis.   IMPRESSION: Discectomy and fusion from an anterior approach at L2-3 and L3-4. See above discussion.  Provider: Wilburt Finlay   Lumbar CT w contrast:  Results for orders placed during the hospital encounter of 10/08/12  CT Lumbar Spine W Contrast   Narrative *RADIOLOGY REPORT*  Clinical Data:  Back pain  CT MYELOGRAPHY LUMBAR SPINE  Technique:  CT imaging of the lumbar spine was performed after intrathecal contrast administration.  Multiplanar CT image reconstructions were also generated.  Comparison:  01/10/2008  Findings: Radiologist injection.  Anatomic alignment of the vertebral bodies.  Bilateral pedicle screws are in place extending from L4 into the thoracic  spine with cross stabilizing bars.  Solid bony callus along the posterior elements is noted.  Disc spacers are in place at L2-3 and L3-4 with solid trans disc space effusion.  No breakage or loosening of the hardware.  The conus medullaris terminates at the L1-2 disc.  No new vertebral compression deformity.  Wedging at L3 is stable.  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  L1-2:  Fused level.  Central canal and foramina are widely patent.  L2-3:  Fused level.  Central canal and foramina are widely patent.  L3-4.  Fused level.  Central canal and foramina are widely patent.  L4-5:  Mild facet arthropathy and ligamentum flavum hypertrophy. Mild concentric disc bulge.  No significant central or lateral recess narrowing foramina patent.  L5 S1:  Shallow central protrusion.  No lateral recess or central canal narrowing.  Patent foramina.  Very minimal facet arthropathy.  IMPRESSION: Solid thoracolumbar fusion through L4.  Mild degenerative disc disease at L4-5 and L5 S1 without evidence of impingement.  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   Original Report Authenticated By: Marybelle Killings, M.D.    Lumbar DG 2-3 views:  Results for orders placed during the hospital encounter of 01/08/08  DG Lumbar Spine 2-3 Views   Narrative Clinical Data: Degenerative scoliosis with back pain.   LUMBAR SPINE - 2-3 VIEW   Comparison: 01/02/2008.   Findings: Two intraoperative prone views of the thoracolumbar junction are submitted.  Both films show an esophageal temperature probe and NG tube in place.  The patient has extensive thoracolumbar fusion beginning at the level of T10 (assuming 12 rib- bearing vertebral bodies) and extending to at least the level of L4.  The left-sided fusion rod ends at the L4 pedicle screw, but the caudal most extent of the right-sided hardware has not been visualized.  Intervertebral graft material is seen at L2-3 and  L3- 4.   IMPRESSION: Extensive thoracolumbar fusion with incomplete visualization of the right-sided spinal hardware.  No evidence for immediate hardware complications within the visualized anatomy.  Provider: Roxan Hockey Ward   Lumbar DG (Complete) 4+V:  Results for orders placed in visit on 05/03/07  DG Lumbar Spine Complete   Narrative * PRIOR REPORT IMPORTED FROM AN EXTERNAL SYSTEM *   PRIOR REPORT IMPORTED FROM THE [facility] [place] EXAM:    shoulder pain  COMMENTS:   PROCEDURE:     MDR - MDR LUMBAR SPINE WITH OBLIQUES  - May 03 2007 12:34PM   RESULT:     The vertebral body heights and the intervertebral disc spaces  are well maintained. The vertebral body alignment is normal. Oblique views  show no significant abnormalities of the articular facets. There is mild  degenerative spurring anteriorly at multiple levels. The pedicles are  bilaterally intact. There is a 15 degrees lumbar scoliosis with convexity  to  the right.   IMPRESSION:  1. No fracture or other acute bony abnormality is identified.  2. There is a mild lumbar scoliosis.       Lumbar DG Myelogram views:  Results for orders placed during the hospital encounter of 10/08/12  DG Myelogram Lumbar   Narrative *RADIOLOGY REPORT*  Clinical Data:  Back pain  MYELOGRAM LUMBAR  Technique: The procedure, risks, benefits, and alternatives were explained to the patient. The patient understands and consents. Under fluoroscopic guidance, a 22 gauge spinal needle was placed in the CSF space via left L5-S1 approach. 20 mL of Omnipaque 180 was injected.  Findings: Bilateral pedicle screws and cross stabilizing bars are present in L4, and L3, L2, L1, T12, T11, and above the upper limit of the study.  There is no breakage or loosening of the hardware. There is also anatomic alignment.   Mild wedging at L3 is stable compared with March 21, 2007.  Disc spacers are in  place in the interposed   levels at L2-3 and L3-4.  There is very minimal anterior epidural mass effect at L4-5.  No mass effect at L5-S1.  The central canal is widely patent throughout the lumbar spine.  Flexion and extension views demonstrate no evidence of abnormal motion to suggest instability. There is very limited range of motion in the lumbar spine.  Complications: None.  Fluoroscopy Time: 1 minute and 8 seconds.  IMPRESSION: Thoracolumbar fusion as described.  No evidence of spinal stenosis or hardware failure.   Original Report Authenticated By: Marybelle Killings, M.D.    Hip Imaging: Hip-R MR w contrast:  Results for orders placed during the hospital encounter of 07/26/11  MR Hip Right W Contrast   Narrative *RADIOLOGY REPORT*  Clinical Data: History of torn right labrum 3 years ago.  History of labral repair.  Recurrent hip and groin pain.  MRI WITH INFUSION OF THE RIGHT HIP (MR ARTHROGRAM)  Technique:  Multiplanar, multisequence MR imaging of right hip was performed following the administration of intraarticular contrast.  Comparison: 07/18/2010.  Findings:  There is recurrent tear of the anterior superior labrum of the right hip.  This is at the 10 o'clock position of the acetabular labrum.  Contrast tracks between the acetabulum and labral soft tissue structures.  Minimal chondromalacia is present.  When comparing today's examination to the prior exam of 07/18/2010, the labrum is not completely detached at the 10 o'clock position. The visceral pelvis appears within normal limits.  IMPRESSION: Recurrent anterior superior labral tear at the 10 o'clock position. Mild right hip osteoarthritis.  Original Report Authenticated By: Dereck Ligas, M.D.   Note: Results of ordered imaging test(s) reviewed and explained to patient in Layman's terms. Copy of results provided to patient.  Meds   Current Outpatient Prescriptions:  .  albuterol (PROVENTIL  HFA;VENTOLIN HFA) 108 (90 BASE) MCG/ACT inhaler, Inhale 2 puffs into the lungs every 6 (six) hours as needed for wheezing., Disp: , Rfl:  .  ALPRAZolam (XANAX) 1 MG tablet, TAKE 1/2 TAB BY MOUTH EVERY MORNING, 1/2 TAB AT LUNCH, AND 2 TABS AT BEDTIME, Disp: 90 tablet, Rfl: 5 .  buPROPion (WELLBUTRIN SR) 150 MG 12 hr tablet, Take 1 tablet (150 mg total) by mouth 2 (two) times daily., Disp: 180 tablet, Rfl: 1 .  escitalopram (LEXAPRO) 10 MG tablet, Take 1 tablet (10 mg total) by mouth at bedtime. Take 1/2 tab PO q h.s. X 1 week then increase to 1 tab PO q h.s., Disp: 90 tablet, Rfl: 3 .  fluticasone (FLONASE) 50 MCG/ACT nasal spray, Place 2 sprays into both nostrils daily., Disp: 16 g, Rfl: 3 .  Fluticasone-Salmeterol (ADVAIR DISKUS) 500-50 MCG/DOSE AEPB, Inhale 1 puff into the lungs 2 (two) times daily., Disp: 60 each, Rfl: 5 .  loratadine (CLARITIN REDITABS) 10 MG dissolvable tablet, Take 1 tablet (10 mg total) by mouth daily., Disp: 90 tablet, Rfl: 3 .  metoprolol succinate (TOPROL-XL) 50 MG 24 hr tablet, TAKE 1 TABLET BY MOUTH EVERY DAY, Disp: 90 tablet, Rfl: 1 .  montelukast (SINGULAIR) 10 MG tablet, TAKE 1 TABLET (10 MG TOTAL) BY MOUTH DAILY., Disp: 90 tablet, Rfl: 1 .  omeprazole (PRILOSEC) 20 MG capsule, TAKE ONE CAPSULE BY MOUTH TWICE A DAY, Disp: 60 capsule, Rfl: 5 .  ondansetron (ZOFRAN) 4 MG tablet, Take 1 tablet (4 mg total) by mouth every 8 (eight) hours as needed for nausea or vomiting., Disp: 20 tablet, Rfl: 0 .  Spacer/Aero-Holding Chambers (AEROCHAMBER MV) inhaler,  by Does not apply route., Disp: , Rfl:  .  valACYclovir (VALTREX) 1000 MG tablet, TAKE TWO TABLETS BY MOUTH TWICE DAILY FOR ONE DAY FEVER BLISTER, Disp: 4 tablet, Rfl: 5  ROS  Constitutional: Denies any fever or chills Gastrointestinal: No reported hemesis, hematochezia, vomiting, or acute GI distress Musculoskeletal: Denies any acute onset joint swelling, redness, loss of ROM, or weakness Neurological: No reported  episodes of acute onset apraxia, aphasia, dysarthria, agnosia, amnesia, paralysis, loss of coordination, or loss of consciousness  Allergies  Ms. Schofield is allergic to amoxicillin; clindamycin; codeine; sulfa antibiotics; shellfish allergy; decadron [dexamethasone]; papaya derivatives; prednisone; betadine [povidone iodine]; chlorhexidine; clarithromycin; clindamycin/lincomycin; and other.  [place]  Drug: Ms. Arai  reports that she does not use drugs. Alcohol:  reports that she does not drink alcohol. Tobacco:  reports that she has never smoked. She has never used smokeless tobacco. 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); Anemia; Anxiety; Asthma; Cephalalgia (08/18/2014); Cervical disc disease (08/18/2014); Chronic headaches; DDD (degenerative disc disease); Depression; Dizziness (04/22/2013); Duodenal ulcer with hemorrhage and perforation ([place]) (04/27/2003); GERD (gastroesophageal reflux disease); H/O arthrodesis (08/18/2014); Headache(784.0); History of blood transfusion; History of cervical spinal surgery (01/04/2015); History of kidney stones; Hypertension; Inverted T wave; Narrowing of intervertebral disc space (08/18/2014); Orthostatic hypotension (04/22/2013); Pain; Pneumonia; PONV (postoperative nausea and vomiting); Postop Hyponatremia (05/14/2012); Postoperative anemia due to acute blood loss (05/14/2012); Right foot drop; and Right hip arthralgia (08/18/2014). Surgical: Ms. Engelbrecht  has a past surgical history that includes RIGHT HIP ARTHROSCOPY FOR LABRAL TEAR (ABOUT 2010); Anterior fusion cervical spine (MAY 2012); Nasal septum surgery (MARCH 2013); Back surgery (2009); Tonsillectomy; Abdominal hysterectomy; DIAGNOSTIC LAPAROSCOPIES - MULTIPLE FOR ENDOMETRIOSIS; Cholecystectomy; Hip arthroscopy (09/20/2011); Shoulder arthroscopy (05-06-12); Carpal tunnel release (05-06-12); Total hip arthroplasty (Right, 05/13/2012); Anterior cervical decomp/discectomy fusion (N/A, 10/30/2012);  Posterior cervical fusion/foraminotomy (N/A, 04/16/2013); Breast biopsy (Right, 2008); and Breast excisional biopsy (Left, 1998). Family: family history includes Aneurysm in her maternal grandmother and mother; Breast cancer (age of onset: 54) in her paternal grandmother.  Constitutional Exam  General appearance: Well nourished, well developed, and well hydrated. In no apparent acute distress There were no vitals filed for this visit. BMI Assessment: Estimated body mass index is 26.09 kg/m as calculated from the following:   Height as of 10/11/16: 5' 4"  (1.626 m).   Weight as of 10/27/16: 152 lb (68.9 kg).  BMI interpretation table: BMI level Category Range association with higher incidence of chronic pain  <18 kg/m2 Underweight   18.5-24.9 kg/m2 Ideal body weight   25-29.9 kg/m2 Overweight Increased incidence by 20%  30-34.9 kg/m2 Obese (Class I) Increased incidence by 68%  35-39.9 kg/m2 Severe obesity (Class II) Increased incidence by 136%  >40 kg/m2 Extreme obesity (Class III) Increased incidence by 254%   BMI Readings from Last 4 Encounters:  10/27/16 26.09 kg/m  10/11/16 24.03 kg/m  08/25/16 25.40 kg/m  07/13/16 25.75 kg/m   Wt Readings from Last 4 Encounters:  10/27/16 152 lb (68.9 kg)  10/11/16 140 lb (63.5 kg)  08/25/16 148 lb (67.1 kg)  07/13/16 150 lb (68 kg)  Psych/Mental status: Alert, oriented x 3 (person, place, & time)       Eyes: PERLA Respiratory: No evidence of acute respiratory distress  Cervical Spine Area Exam  Skin & Axial Inspection: No masses, redness, edema, swelling, or associated skin lesions Alignment: Symmetrical Functional ROM: Unrestricted ROM      Stability: No instability detected Muscle Tone/Strength: Functionally intact. No  obvious neuro-muscular anomalies detected. Sensory (Neurological): Unimpaired Palpation: No palpable anomalies              Upper Extremity (UE) Exam    Side: Right upper extremity  Side: Left upper extremity  Skin &  Extremity Inspection: Skin color, temperature, and hair growth are WNL. No peripheral edema or cyanosis. No masses, redness, swelling, asymmetry, or associated skin lesions. No contractures.  Skin & Extremity Inspection: Skin color, temperature, and hair growth are WNL. No peripheral edema or cyanosis. No masses, redness, swelling, asymmetry, or associated skin lesions. No contractures.  Functional ROM: Unrestricted ROM          Functional ROM: Unrestricted ROM          Muscle Tone/Strength: Functionally intact. No obvious neuro-muscular anomalies detected.  Muscle Tone/Strength: Functionally intact. No obvious neuro-muscular anomalies detected.  Sensory (Neurological): Unimpaired          Sensory (Neurological): Unimpaired          Palpation: No palpable anomalies              Palpation: No palpable anomalies              Specialized Test(s): Deferred         Specialized Test(s): Deferred          Thoracic Spine Area Exam  Skin & Axial Inspection: No masses, redness, or swelling Alignment: Symmetrical Functional ROM: Unrestricted ROM Stability: No instability detected Muscle Tone/Strength: Functionally intact. No obvious neuro-muscular anomalies detected. Sensory (Neurological): Unimpaired Muscle strength & Tone: No palpable anomalies  Lumbar Spine Area Exam  Skin & Axial Inspection: No masses, redness, or swelling Alignment: Symmetrical Functional ROM: Unrestricted ROM      Stability: No instability detected Muscle Tone/Strength: Functionally intact. No obvious neuro-muscular anomalies detected. Sensory (Neurological): Unimpaired Palpation: No palpable anomalies       Provocative Tests: Lumbar Hyperextension and rotation test: evaluation deferred today       Lumbar Lateral bending test: evaluation deferred today       Patrick's Maneuver: evaluation deferred today                    Gait & Posture Assessment  Ambulation: Unassisted Gait: Relatively normal for age and body  habitus Posture: WNL   Lower Extremity Exam    Side: Right lower extremity  Side: Left lower extremity  Skin & Extremity Inspection: Skin color, temperature, and hair growth are WNL. No peripheral edema or cyanosis. No masses, redness, swelling, asymmetry, or associated skin lesions. No contractures.  Skin & Extremity Inspection: Skin color, temperature, and hair growth are WNL. No peripheral edema or cyanosis. No masses, redness, swelling, asymmetry, or associated skin lesions. No contractures.  Functional ROM: Unrestricted ROM          Functional ROM: Unrestricted ROM          Muscle Tone/Strength: Functionally intact. No obvious neuro-muscular anomalies detected.  Muscle Tone/Strength: Functionally intact. No obvious neuro-muscular anomalies detected.  Sensory (Neurological): Unimpaired  Sensory (Neurological): Unimpaired  Palpation: No palpable anomalies  Palpation: No palpable anomalies   Assessment  Primary Diagnosis & Pertinent Problem List: The primary encounter diagnosis was Chronic hip pain (Primary Source of Pain) (Bilateral) (L>R). Diagnoses of Chronic shoulder pain (Secondary source of pain) (Bilateral) (L>R), Chronic low back pain [facility] source of pain) (Bilateral) (L>R), and Chronic pain syndrome were also pertinent to this visit.  Status Diagnosis  Controlled Controlled Controlled 1. Chronic hip pain (Primary Source  of Pain) (Bilateral) (L>R)   2. Chronic shoulder pain (Secondary source of pain) (Bilateral) (L>R)   3. Chronic low back pain [facility] [place] source of pain) (Bilateral) (L>R)   4. Chronic pain syndrome     Problems updated and reviewed during this visit: No problems updated. Plan of Care  Pharmacotherapy (Medications Ordered): No orders of the defined types were placed in this encounter.  New Prescriptions   No medications on file   Medications administered today: Ms. Merendino had no medications administered during this visit.  Procedure Orders    No  procedure(s) ordered today   Lab Orders  No laboratory test(s) ordered today   Imaging Orders  No imaging studies ordered today   Referral Orders  No referral(s) requested today    Interventional management options: Planned, scheduled, and/or pending:   Therapeutic bilateral suprascapular nerve RFA, starting with the left side    Considering:   Palliative bilateral suprascapular nerve block  Possible bilateral suprascapular nerve RFA    Palliative PRN treatment(s):   None at this time   Provider-requested follow-up: No Follow-up on file.  Future Appointments Date Time Provider [place]  12/06/2016 9:30 AM Milinda Pointer, MD [facility] None  12/07/2016 10:00 AM [facility] HEALTH ADVISOR [facility] None  12/07/2016 11:00 AM Virginia Crews, MD [facility] None  01/04/2017 10:30 AM Milinda Pointer, MD [facility] None   Primary Care Physician: Jerrol Banana., MD Location: [facility] Note by: Gaspar Cola, MD Date: 12/06/2016; Time: 4:07 PM

## 2016-12-06 ENCOUNTER — Ambulatory Visit: Payer: Medicare Other | Admitting: Pain Medicine

## 2016-12-07 ENCOUNTER — Ambulatory Visit (INDEPENDENT_AMBULATORY_CARE_PROVIDER_SITE_OTHER): Payer: Medicare Other

## 2016-12-07 ENCOUNTER — Encounter: Payer: Self-pay | Admitting: Family Medicine

## 2016-12-07 ENCOUNTER — Ambulatory Visit (INDEPENDENT_AMBULATORY_CARE_PROVIDER_SITE_OTHER): Payer: Medicare Other | Admitting: Family Medicine

## 2016-12-07 VITALS — BP 130/84 | HR 88 | Temp 98.6°F | Resp 16 | Ht 64.0 in | Wt 155.0 lb

## 2016-12-07 VITALS — BP 140/88 | HR 88 | Temp 98.6°F | Resp 16 | Ht 64.0 in | Wt 155.2 lb

## 2016-12-07 DIAGNOSIS — F331 Major depressive disorder, recurrent, moderate: Secondary | ICD-10-CM

## 2016-12-07 DIAGNOSIS — R232 Flushing: Secondary | ICD-10-CM | POA: Diagnosis not present

## 2016-12-07 DIAGNOSIS — F3342 Major depressive disorder, recurrent, in full remission: Secondary | ICD-10-CM

## 2016-12-07 DIAGNOSIS — Z1211 Encounter for screening for malignant neoplasm of colon: Secondary | ICD-10-CM

## 2016-12-07 DIAGNOSIS — F411 Generalized anxiety disorder: Secondary | ICD-10-CM | POA: Diagnosis not present

## 2016-12-07 DIAGNOSIS — Z0000 Encounter for general adult medical examination without abnormal findings: Secondary | ICD-10-CM

## 2016-12-07 DIAGNOSIS — Z23 Encounter for immunization: Secondary | ICD-10-CM | POA: Diagnosis not present

## 2016-12-07 DIAGNOSIS — Z114 Encounter for screening for human immunodeficiency virus [HIV]: Secondary | ICD-10-CM | POA: Diagnosis not present

## 2016-12-07 DIAGNOSIS — R1310 Dysphagia, unspecified: Secondary | ICD-10-CM | POA: Diagnosis not present

## 2016-12-07 DIAGNOSIS — R61 Generalized hyperhidrosis: Secondary | ICD-10-CM

## 2016-12-07 MED ORDER — BUPROPION HCL ER (XL) 300 MG PO TB24: 300.0000 mg | ORAL_TABLET | Freq: Every day | ORAL | 1 refills | Status: DC

## 2016-12-07 MED ORDER — ESCITALOPRAM OXALATE 20 MG PO TABS: 20.0000 mg | ORAL_TABLET | Freq: Every day | ORAL | 3 refills | Status: DC

## 2016-12-07 NOTE — Assessment & Plan Note (Signed)
Uncontrolled Increase lexapro to 20mg  daily Switch Wellbutrin to XL formulation to help mitigate sleep side effects and changedose to 300mg  daily F/u in 6 weeks

## 2016-12-07 NOTE — Patient Instructions (Addendum)
[name] , Thank you for taking time to come for your Medicare Wellness Visit. I appreciate your ongoing commitment to your health goals. Please review the following plan we discussed and let me know if I can assist you in the future.   Screening recommendations/referrals: Colonoscopy: Completed with [facility] approx 11years ago. Pt does not wish to have it completed. Mammogram: completed 02/18/16. Recently had breast reduction. Recommended not to update for 6 months postop due to healing. Bone Density: N/A Recommended yearly ophthalmology/optometry visit for glaucoma screening and checkup Recommended yearly dental visit for hygiene and checkup  Vaccinations: Influenza vaccine: given today Pneumococcal vaccine: N/A Tdap vaccine: Will check with insurance Shingles vaccine: N/A   Advanced directives: Please bring a copy of your POA (Power of Attorney) and/or Living Will to your next appointment.   Conditions/risks identified: Recommend increasing fluid intake to 4-6 glasses each day; Fall risk prevention discussed  Next appointment: Scheduled to see Dr. Brita Romp 12/07/16 @ 11:00am. Follow up one year for annual wellness  Preventive Care 40-64 Years, Female Preventive care refers to lifestyle choices and visits with your health care provider that can promote health and wellness. What does preventive care include?  A yearly physical exam. This is also called an annual well check.  Dental exams once or twice a year.  Routine eye exams. Ask your health care provider how often you should have your eyes checked.  Personal lifestyle choices, including:  Daily care of your teeth and gums.  Regular physical activity.  Eating a healthy diet.  Avoiding tobacco and drug use.  Limiting alcohol use.  Practicing safe sex.  Taking low-dose aspirin daily starting at age 54.  Taking vitamin and mineral supplements as recommended by your health care provider. What happens during  an annual well check? The services and screenings done by your health care provider during your annual well check will depend on your age, overall health, lifestyle risk factors, and family history of disease. Counseling  Your health care provider may ask you questions about your:  Alcohol use.  Tobacco use.  Drug use.  Emotional well-being.  Home and relationship well-being.  Sexual activity.  Eating habits.  Work and work Statistician.  Method of birth control.  Menstrual cycle.  Pregnancy history. Screening  You may have the following tests or measurements:  Height, weight, and BMI.  Blood pressure.  Lipid and cholesterol levels. These may be checked every 5 years, or more frequently if you are over 65 years old.  Skin check.  Lung cancer screening. You may have this screening every year starting at age 47 if you have a 30-pack-year history of smoking and currently smoke or have quit within the past 15 years.  Fecal occult blood test (FOBT) of the stool. You may have this test every year starting at age 6.  Flexible sigmoidoscopy or colonoscopy. You may have a sigmoidoscopy every 5 years or a colonoscopy every 10 years starting at age 30.  Hepatitis C blood test.  Hepatitis B blood test.  Sexually transmitted disease (STD) testing.  Diabetes screening. This is done by checking your blood sugar (glucose) after you have not eaten for a while (fasting). You may have this done every 1-3 years.  Mammogram. This may be done every 1-2 years. Talk to your health care provider about when you should start having regular mammograms. This may depend on whether you have a family history of breast cancer.  BRCA-related cancer screening. This may be done if  you have a family history of breast, ovarian, tubal, or peritoneal cancers.  Pelvic exam and Pap test. This may be done every 3 years starting at age 43. Starting at age 48, this may be done every 5 years if you have a  Pap test in combination with an HPV test.  Bone density scan. This is done to screen for osteoporosis. You may have this scan if you are at high risk for osteoporosis. Discuss your test results, treatment options, and if necessary, the need for more tests with your health care provider. Vaccines  Your health care provider may recommend certain vaccines, such as:  Influenza vaccine. This is recommended every year.  Tetanus, diphtheria, and acellular pertussis (Tdap, Td) vaccine. You may need a Td booster every 10 years.  Zoster vaccine. You may need this after age 74.  Pneumococcal 13-valent conjugate (PCV13) vaccine. You may need this if you have certain conditions and were not previously vaccinated.  Pneumococcal polysaccharide (PPSV23) vaccine. You may need one or two doses if you smoke cigarettes or if you have certain conditions. Talk to your health care provider about which screenings and vaccines you need and how often you need them. This information is not intended to replace advice given to you by your health care provider. Make sure you discuss any questions you have with your health care provider. Document Released: 03/26/2015 Document Revised: 11/17/2015 Document Reviewed: 12/29/2014 Elsevier Interactive Patient Education  2017 [place] Prevention in the Home Falls can cause injuries. They can happen to people of all ages. There are many things you can do to make your home safe and to help prevent falls. What can I do on the outside of my home?  Regularly fix the edges of walkways and driveways and fix any cracks.  Remove anything that might make you trip as you walk through a door, such as a raised step or threshold.  Trim any bushes or trees on the path to your home.  Use bright outdoor lighting.  Clear any walking paths of anything that might make someone trip, such as rocks or tools.  Regularly check to see if handrails are loose or broken. Make sure  that both sides of any steps have handrails.  Any raised decks and porches should have guardrails on the edges.  Have any leaves, snow, or ice cleared regularly.  Use sand or salt on walking paths during winter.  Clean up any spills in your garage right away. This includes oil or grease spills. What can I do in the bathroom?  Use night lights.  Install grab bars by the toilet and in the tub and shower. Do not use towel bars as grab bars.  Use non-skid mats or decals in the tub or shower.  If you need to sit down in the shower, use a plastic, non-slip stool.  Keep the floor dry. Clean up any water that spills on the floor as soon as it happens.  Remove soap buildup in the tub or shower regularly.  Attach bath mats securely with double-sided non-slip rug tape.  Do not have throw rugs and other things on the floor that can make you trip. What can I do in the bedroom?  Use night lights.  Make sure that you have a light by your bed that is easy to reach.  Do not use any sheets or blankets that are too big for your bed. They should not hang down onto the floor.  Have a firm chair that has side arms. You can use this for support while you get dressed.  Do not have throw rugs and other things on the floor that can make you trip. What can I do in the kitchen?  Clean up any spills right away.  Avoid walking on wet floors.  Keep items that you use a lot in easy-to-reach places.  If you need to reach something above you, use a strong step stool that has a grab bar.  Keep electrical cords out of the way.  Do not use floor polish or wax that makes floors slippery. If you must use wax, use non-skid floor wax.  Do not have throw rugs and other things on the floor that can make you trip. What can I do with my stairs?  Do not leave any items on the stairs.  Make sure that there are handrails on both sides of the stairs and use them. Fix handrails that are broken or loose. Make  sure that handrails are as long as the stairways.  Check any carpeting to make sure that it is firmly attached to the stairs. Fix any carpet that is loose or worn.  Avoid having throw rugs at the top or bottom of the stairs. If you do have throw rugs, attach them to the floor with carpet tape.  Make sure that you have a light switch at the top of the stairs and the bottom of the stairs. If you do not have them, ask someone to add them for you. What else can I do to help prevent falls?  Wear shoes that:  Do not have high heels.  Have rubber bottoms.  Are comfortable and fit you well.  Are closed at the toe. Do not wear sandals.  If you use a stepladder:  Make sure that it is fully opened. Do not climb a closed stepladder.  Make sure that both sides of the stepladder are locked into place.  Ask someone to hold it for you, if possible.  Clearly mark and make sure that you can see:  Any grab bars or handrails.  First and last steps.  Where the edge of each step is.  Use tools that help you move around (mobility aids) if they are needed. These include:  Canes.  Walkers.  Scooters.  Crutches.  Turn on the lights when you go into a dark area. Replace any light bulbs as soon as they burn out.  Set up your furniture so you have a clear path. Avoid moving your furniture around.  If any of your floors are uneven, fix them.  If there are any pets around you, be aware of where they are.  Review your medicines with your doctor. Some medicines can make you feel dizzy. This can increase your chance of falling. Ask your doctor what other things that you can do to help prevent falls. This information is not intended to replace advice given to you by your health care provider. Make sure you discuss any questions you have with your health care provider. Document Released: 12/24/2008 Document Revised: 08/05/2015 Document Reviewed: 04/03/2014 Elsevier Interactive Patient Education   2017 [employer].

## 2016-12-07 NOTE — Progress Notes (Signed)
Patient: [name], Female    DOB: 06-20-1960, 56 y.o.   MRN: [id number] Visit Date: 12/07/2016  Today's Provider: Lavon Paganini, MD   Chief Complaint  Patient presents with  . Annual Exam   Subjective:    Annual physical exam [name] is a 56 y.o. female who presents today for health maintenance and complete physical. She feels fairly well. She is c/o back, hip, shoulder pain. She has an appointment with pain management next week. She reports she is exercising daily. She walks her grandchildren to and from school (she is rasing her 3 grandchildren). She reports she is sleeping fairly well. Alprazolam 2 mg qhs, which improves sleep.  States that she is overwhelmed and depressed due to stress of raising 3 grandchildren.  One granddaughter is in psych institute for attacking several teachers.  Another grandchild has some unspecified neurodegenerative disorder.  Currently taking lexapro 10mg  daily and buproprion 150mg  BID.  Has trouble falling asleep and with panic attacks, so taking Xanax qhs.    Last mammogram- 02/18/2016- BI-RADS 1. Pt had breast reduction surgery in April, 2018. Benign pathology. Last pap- S/P hysterectomy 20 years ago due to endometriosis. Last colonoscopy- 11 years ago. Medicare sends pt at home stool test yearly. Per pt, the results from 2 months ago were "fine".  [name] is c/o sweats. She states she sweats all day. She has to change clothes and pajamas about 3 times per day. She has her ovaries, but states the sweats do not feel like hot flashes.  Ongoing for ~9 months.  Dysphagia ongoing for many months.  Seems to choke on food.  Afraid to eat when she is alone.  States that she has had several neck surgeries and wonders if something is compressing externally.  No pain, weight loss, appetite change, N/V. -----------------------------------------------------------------   Review of Systems  Constitutional: Positive for diaphoresis. Negative  for activity change, appetite change, chills, fatigue, fever and unexpected weight change.  HENT: Negative.   Eyes: Negative.   Respiratory: Negative.   Cardiovascular: Negative.   Gastrointestinal: Negative.   Endocrine: Negative.   Genitourinary: Negative.   Musculoskeletal: Positive for arthralgias, back pain, gait problem, neck pain and neck stiffness. Negative for joint swelling and myalgias.  Skin: Negative.   Allergic/Immunologic: Negative.   Neurological: Negative for dizziness, tremors, seizures, syncope, facial asymmetry, speech difficulty, weakness, light-headedness, numbness and headaches.  Hematological: Negative.   Psychiatric/Behavioral: Negative.     Social History      She  reports that she has never smoked. She has never used smokeless tobacco. She reports that she does not drink alcohol or use drugs.       Social History   Social History  . Marital status: Married    Spouse name: N/A  . Number of children: 2  . Years of education: N/A   Social History Main Topics  . Smoking status: Never Smoker  . Smokeless tobacco: Never Used  . Alcohol use No  . Drug use: No  . Sexual activity: Yes   Other Topics Concern  . None   Social History Narrative  . None    Past Medical History:  Diagnosis Date  . Abnormal EKG    HX OF INVERTED T WAVES ON EKG, PALPITATIONS, CHEST PAINS-CARDIAC WORK UP DID NOT SHOW ANY HEART DISEASE  . 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 Anemia    Iron Infusion-8 yrs ago  . Anxiety   . Asthma    INHALERS  WITH FLARE -UPS  . Cephalalgia 08/18/2014  . Cervical disc disease 08/18/2014   Needs neck surgery.   . Chronic headaches   . DDD (degenerative disc disease)    CERVICAL AND LUMBAR-CHRONIC PAIN, RT HIP LABRAL TEAR  . Depression    PT STATES A LOT OF STRESS IN HER LIFE  . Dizziness 04/22/2013  . Duodenal ulcer with hemorrhage and perforation ([place]) 04/27/2003  . GERD (gastroesophageal reflux disease)   . H/O arthrodesis 08/18/2014  .  Headache(784.0)    AND NECK PAIN--STATES RECENT TEST SHOW CERVICAL DEGENERATION  . History of blood transfusion    s/p back surgery  . History of cervical spinal surgery 01/04/2015  . History of kidney stones   . Hypertension   . Inverted T wave   . Narrowing of intervertebral disc space 08/18/2014   Currently on disability.   . Orthostatic hypotension 04/22/2013  . Pain    CHRONIC NECK AND BACK PAIN - LIMITED ROM NECK - S/P FUSIONS CERVICAL AND LUMBAR  . Pneumonia   . PONV (postoperative nausea and vomiting)    PT GIVES HX OF N&V AND FEVER WITH SURGERIES YEARS AGO--BUT NO PROBLEMS WITH MORE RECENT SURGERIES--STATES NOT MALIGNANT HYPERTHERMIA  . Postop Hyponatremia 05/14/2012  . Postoperative anemia due to acute blood loss 05/14/2012  . Right foot drop   . 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      Patient Active Problem List   Diagnosis Date Noted  . Long term prescription benzodiazepine use 10/11/2016  . 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 Chronic hip pain (Primary Source of Pain) (Bilateral) (L>R) 10/11/2016  . 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 Cervicogenic headache 07/06/2015  . History of total hip replacement (THR) (Right) 07/05/2015  . Chronic shoulder radicular pain (Bilateral) (L>R) 07/05/2015  . Lumbar facet syndrome (Location of Tertiary source of pain) (Bilateral) (L>R) 07/05/2015  . Chronic low back pain [facility] source of pain) (Bilateral) (L>R) 04/05/2015  . Chronic left shoulder pain 01/04/2015  . Chronic right hip pain 01/04/2015  . Encounter for therapeutic drug level monitoring 01/04/2015  . Long term current use of opiate analgesic 01/04/2015  . Long term prescription opiate use 01/04/2015  . Opiate use (60 MME/Day) 01/04/2015  . Chronic pain syndrome 01/04/2015  . Steroid intolerance 01/04/2015  . Cervical spine pain  01/04/2015  . Cervical spondylosis 01/04/2015  . Cervical facet arthropathy (bilateral) 01/04/2015  . History of cervical spinal surgery 01/04/2015  . Chronic shoulder pain (Secondary source of pain) (Bilateral) (L>R) 01/04/2015  . 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 Lumbar spondylosis 01/04/2015  . Cervical facet syndrome (bilateral) 01/04/2015  . Chronic right sacroiliac joint pain 01/04/2015  . DOE (dyspnea on exertion) 09/03/2014  . Asthma, mild 08/18/2014  . Blurred vision 08/18/2014  . Benign paroxysmal positional nystagmus 08/18/2014  . Clinical depression 08/18/2014  . Fatigue 08/18/2014  . Cannot sleep 08/18/2014  . Awareness of heartbeats 08/18/2014  . RAD (reactive airway disease) 08/18/2014  . Apnea, sleep 08/18/2014  . Chronic neck pain 04/16/2013  . Anxiety state 08/03/2003  . Colon, diverticulosis 07/15/2003  . IBS (irritable bowel syndrome) 04/27/2003  . Esophagitis, reflux 02/11/2003  . 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 ABDOMINAL HYSTERECTOMY    .  ANTERIOR CERVICAL DECOMP/DISCECTOMY FUSION N/A 10/30/2012   Procedure: ACDF C5-6, EXPLORATION AND HARDWARE REMOVAL C6-7;  Surgeon: Melina Schools, MD;  Location: [facility];  Service: Orthopedics;  Laterality: N/A;  . ANTERIOR FUSION CERVICAL SPINE  MAY 2012   AT [facility]  . BACK SURGERY  2009   LUMBAR FUSION WITH RODS   . BREAST BIOPSY Right 2008   benign.- bx/clip  . BREAST EXCISIONAL BIOPSY Left 1998   benign  . BREAST REDUCTION SURGERY Bilateral 06/2016  . CARPAL TUNNEL RELEASE  05-06-12   Right  . CHOLECYSTECTOMY    . DIAGNOSTIC LAPAROSCOPIES - MULTIPLE FOR ENDOMETRIOSIS    . HIP ARTHROSCOPY  09/20/2011   Procedure: ARTHROSCOPY HIP;  Surgeon: Gearlean Alf, MD;  Location: [facility];  Service: Orthopedics;  Laterality: Right;  Right Hip Scope with Labral Debridement  . NASAL SEPTUM  SURGERY  MARCH 2013   IN [place]  . POSTERIOR CERVICAL FUSION/FORAMINOTOMY N/A 04/16/2013   Procedure: REMOVAL CERVICAL PLATES AND INTERBODY CAGE/POSTERIOR CERVICAL SPINAL FUSION C4 - C6/C5 CORPECTOMY/C4 - C6 FUSION WITH ILIAC CREST BONE GRAFT;  Surgeon: Melina Schools, MD;  Location: [facility];  Service: Orthopedics;  Laterality: N/A;  . RIGHT HIP ARTHROSCOPY FOR LABRAL TEAR  ABOUT 2010   2012 also  . SHOULDER ARTHROSCOPY  05-06-12   bone spur  . TONSILLECTOMY     AS A CHILD  . TOTAL HIP ARTHROPLASTY Right 05/13/2012   Procedure: TOTAL HIP ARTHROPLASTY ANTERIOR APPROACH;  Surgeon: Gearlean Alf, MD;  Location: [facility];  Service: Orthopedics;  Laterality: Right;    Family History        Family Status  Relation Status  . Mother Deceased  . MGM Deceased  . Father Deceased  . PGM Deceased        Her family history includes Aneurysm in her maternal grandmother and mother; Breast cancer (age of onset: 71) in her paternal grandmother.     Allergies  Allergen Reactions  . Amoxicillin Hives  . Clindamycin Hives  . Codeine Hives  . Sulfa Antibiotics Nausea And Vomiting and Hives       . Shellfish Allergy Hives  . Decadron [Dexamethasone] Other (See Comments)    Hot flashes, insomnia, "manic"  . Papaya Derivatives Hives  . Prednisone     High blood pressure, flushed, mood changes, heart palpitations  . Betadine [Povidone Iodine] Hives       . Chlorhexidine Hives       . Clarithromycin Hives  . Clindamycin/Lincomycin Hives  . Other Hives     Mango      Current Outpatient Prescriptions:  .  albuterol (PROVENTIL HFA;VENTOLIN HFA) 108 (90 BASE) MCG/ACT inhaler, Inhale 2 puffs into the lungs every 6 (six) hours as needed for wheezing., Disp: , Rfl:  .  ALPRAZolam (XANAX) 1 MG tablet, TAKE 1/2 TAB BY MOUTH EVERY MORNING, 1/2 TAB AT LUNCH, AND 2 TABS AT BEDTIME, Disp: 90 tablet, Rfl: 5 .  buPROPion (WELLBUTRIN SR) 150 MG 12 hr tablet, Take 1 tablet (150 mg total) by mouth 2 (two) times  daily., Disp: 180 tablet, Rfl: 1 .  escitalopram (LEXAPRO) 10 MG tablet, Take 1 tablet (10 mg total) by mouth at bedtime. Take 1/2 tab PO q h.s. X 1 week then increase to 1 tab PO q h.s., Disp: 90 tablet, Rfl: 3 .  fluticasone (FLONASE) 50 MCG/ACT nasal spray, Place 2 sprays into both nostrils daily. (Patient taking differently: Place 2 sprays into both nostrils daily as needed. ),  Disp: 16 g, Rfl: 3 .  Fluticasone-Salmeterol (ADVAIR DISKUS) 500-50 MCG/DOSE AEPB, Inhale 1 puff into the lungs 2 (two) times daily. (Patient taking differently: Inhale 1 puff into the lungs 2 (two) times daily. I puff in the morning and 1 puff at night), Disp: 60 each, Rfl: 5 .  loratadine (CLARITIN REDITABS) 10 MG dissolvable tablet, Take 1 tablet (10 mg total) by mouth daily., Disp: 90 tablet, Rfl: 3 .  metoprolol succinate (TOPROL-XL) 50 MG 24 hr tablet, TAKE 1 TABLET BY MOUTH EVERY DAY, Disp: 90 tablet, Rfl: 1 .  montelukast (SINGULAIR) 10 MG tablet, TAKE 1 TABLET (10 MG TOTAL) BY MOUTH DAILY., Disp: 90 tablet, Rfl: 1 .  omeprazole (PRILOSEC) 20 MG capsule, TAKE ONE CAPSULE BY MOUTH TWICE A DAY, Disp: 60 capsule, Rfl: 5 .  ondansetron (ZOFRAN) 4 MG tablet, Take 1 tablet (4 mg total) by mouth every 8 (eight) hours as needed for nausea or vomiting., Disp: 20 tablet, Rfl: 0 .  Spacer/Aero-Holding Chambers (AEROCHAMBER MV) inhaler, by Does not apply route., Disp: , Rfl:  .  valACYclovir (VALTREX) 1000 MG tablet, TAKE TWO TABLETS BY MOUTH TWICE DAILY FOR ONE DAY FEVER BLISTER, Disp: 4 tablet, Rfl: 5   Patient Care Team: Jerrol Banana., MD as PCP - General (Family Medicine) Mar Daring, PA-C as Physician Assistant (Family Medicine)      Objective:   Vitals: BP 130/84 (BP Location: Left Arm, Patient Position: Sitting, Cuff Size: Normal)   Pulse 88   Temp 98.6 F (37 C) (Oral)   Resp 16   Ht 5\' 4"  (1.626 m)   Wt 155 lb (70.3 kg)   BMI 26.61 kg/m    Vitals:   12/07/16 1125  BP: 130/84  Pulse:  88  Resp: 16  Temp: 98.6 F (37 C)  TempSrc: Oral  Weight: 155 lb (70.3 kg)  Height: 5\' 4"  (1.626 m)     Physical Exam  Constitutional: She is oriented to person, place, and time. She appears well-developed and well-nourished. No distress.  HENT:  Head: Normocephalic and atraumatic.  Right Ear: External ear normal.  Left Ear: External ear normal.  Nose: Nose normal.  Mouth/Throat: Oropharynx is clear and moist.  Eyes: Pupils are equal, round, and reactive to light. Conjunctivae are normal. No scleral icterus.  Neck: Neck supple. No thyromegaly present.  Cardiovascular: Normal rate, regular rhythm, normal heart sounds and intact distal pulses.   No murmur heard. Pulmonary/Chest: Effort normal and breath sounds normal. No respiratory distress. She has no wheezes. She has no rales.  Abdominal: Soft. Bowel sounds are normal. She exhibits no distension. There is no tenderness. There is no rebound and no guarding.  Musculoskeletal: She exhibits no edema or deformity.  Lymphadenopathy:    She has no cervical adenopathy.  Neurological: She is alert and oriented to person, place, and time.  Skin: Skin is warm and dry. No rash noted.  Psychiatric: Her behavior is normal.  Depressed mood and somewhat flat affect  Vitals reviewed.    Depression Screen PHQ 2/9 Scores 12/07/2016 10/11/2016 07/13/2016 06/19/2016  PHQ - 2 Score 3 0 0 0  PHQ- 9 Score 11 - - -  Exception Documentation - - - -  Not completed - - - -    GAD 7 : Generalized Anxiety Score 12/07/2016  Nervous, Anxious, on Edge 3  Control/stop worrying 3  Worry too much - different things 3  Trouble relaxing 1  Restless 1  Easily annoyed or irritable  2  Afraid - awful might happen 3  Total GAD 7 Score 16  Anxiety Difficulty Somewhat difficult      Assessment & Plan:     Routine Health Maintenance and Physical Exam  Exercise Activities and Dietary recommendations Goals    . Increase water intake          Recommend  increasing fluid intake to 4-6 glasses each day       Immunization History  Administered Date(s) Administered  . Influenza,inj,Quad PF,6+ Mos 12/30/2015, 12/07/2016  . Pneumococcal Polysaccharide-23 12/30/2015    Health Maintenance  Topic Date Due  . HIV Screening  06/01/1974  . TETANUS/TDAP  06/01/1978  . PAP SMEAR  05/31/1980  . COLONOSCOPY  05/31/2009  . INFLUENZA VACCINE  10/11/2016  . MAMMOGRAM  02/16/2018  . Hepatitis C Screening  Completed   - flu vaccine given today by [facility] - declines TDAP - requesting Medicare records for colon cancer screening  Discussed health benefits of physical activity, and encouraged her to engage in regular exercise appropriate for her age and condition.   Problem List Items Addressed This Visit      Digestive   Dysphagia    Referral to GI Likely will need EGD Patient agrees to colonoscopy if EGD is required anyways      Relevant Orders   Ambulatory referral to Gastroenterology     Other   Anxiety state - Primary    Uncontrolled Med changes for depression as above Continue Xanax for panic attacks Given chronic opioid use, would recommend tapering in the future      Relevant Medications   escitalopram (LEXAPRO) 20 MG tablet   buPROPion (WELLBUTRIN XL) 300 MG 24 hr tablet   Clinical depression    Uncontrolled Increase lexapro to 20mg  daily Switch Wellbutrin to XL formulation to help mitigate sleep side effects and changedose to 300mg  daily F/u in 6 weeks       Relevant Medications   escitalopram (LEXAPRO) 20 MG tablet   buPROPion (WELLBUTRIN XL) 300 MG 24 hr tablet   Abnormal flushing and sweating    Likely hot flashes Will check TSH, CBC, CMP UTD on cancer screening - with likely upcoming colonoscopy No other B symptoms Could consider clonidine in the future      Relevant Orders   CBC w/Diff/Platelet   Comprehensive metabolic panel   TSH   HIV antibody (with reflex)    Other Visit Diagnoses    Encounter for  screening for HIV       Relevant Orders   HIV antibody (with reflex)   Healthcare maintenance       Screening for colon cancer       Relevant Orders   Ambulatory referral to Gastroenterology        --------------------------------------------------------------------  The entirety of the information documented in the History of Present Illness, Review of Systems and Physical Exam were personally obtained by me. Portions of this information were initially documented by Raquel Sarna Ratchford, CMA and reviewed by me for thoroughness and accuracy.    Lavon Paganini, MD  [facility]

## 2016-12-07 NOTE — Assessment & Plan Note (Signed)
Uncontrolled Med changes for depression as above Continue Xanax for panic attacks Given chronic opioid use, would recommend tapering in the future

## 2016-12-07 NOTE — Patient Instructions (Signed)
Preventive Care 40-64 Years, Female Preventive care refers to lifestyle choices and visits with your health care provider that can promote health and wellness. What does preventive care include?  A yearly physical exam. This is also called an annual well check.  Dental exams once or twice a year.  Routine eye exams. Ask your health care provider how often you should have your eyes checked.  Personal lifestyle choices, including: ? Daily care of your teeth and gums. ? Regular physical activity. ? Eating a healthy diet. ? Avoiding tobacco and drug use. ? Limiting alcohol use. ? Practicing safe sex. ? Taking low-dose aspirin daily starting at age 57. ? Taking vitamin and mineral supplements as recommended by your health care provider. What happens during an annual well check? The services and screenings done by your health care provider during your annual well check will depend on your age, overall health, lifestyle risk factors, and family history of disease. Counseling Your health care provider may ask you questions about your:  Alcohol use.  Tobacco use.  Drug use.  Emotional well-being.  Home and relationship well-being.  Sexual activity.  Eating habits.  Work and work Statistician.  Method of birth control.  Menstrual cycle.  Pregnancy history.  Screening You may have the following tests or measurements:  Height, weight, and BMI.  Blood pressure.  Lipid and cholesterol levels. These may be checked every 5 years, or more frequently if you are over 57 years old.  Skin check.  Lung cancer screening. You may have this screening every year starting at age 57 if you have a 30-pack-year history of smoking and currently smoke or have quit within the past 15 years.  Fecal occult blood test (FOBT) of the stool. You may have this test every year starting at age 57.  Flexible sigmoidoscopy or colonoscopy. You may have a sigmoidoscopy every 5 years or a colonoscopy  every 10 years starting at age 57.  Hepatitis C blood test.  Hepatitis B blood test.  Sexually transmitted disease (STD) testing.  Diabetes screening. This is done by checking your blood sugar (glucose) after you have not eaten for a while (fasting). You may have this done every 1-3 years.  Mammogram. This may be done every 1-2 years. Talk to your health care provider about when you should start having regular mammograms. This may depend on whether you have a family history of breast cancer.  BRCA-related cancer screening. This may be done if you have a family history of breast, ovarian, tubal, or peritoneal cancers.  Pelvic exam and Pap test. This may be done every 3 years starting at age 57. Starting at age 57, this may be done every 5 years if you have a Pap test in combination with an HPV test.  Bone density scan. This is done to screen for osteoporosis. You may have this scan if you are at high risk for osteoporosis.  Discuss your test results, treatment options, and if necessary, the need for more tests with your health care provider. Vaccines Your health care provider may recommend certain vaccines, such as:  Influenza vaccine. This is recommended every year.  Tetanus, diphtheria, and acellular pertussis (Tdap, Td) vaccine. You may need a Td booster every 10 years.  Varicella vaccine. You may need this if you have not been vaccinated.  Zoster vaccine. You may need this after age 57.  Measles, mumps, and rubella (MMR) vaccine. You may need at least one dose of MMR if you were born in  1958 or later. You may also need a second dose.  Pneumococcal 13-valent conjugate (PCV13) vaccine. You may need this if you have certain conditions and were not previously vaccinated.  Pneumococcal polysaccharide (PPSV23) vaccine. You may need one or two doses if you smoke cigarettes or if you have certain conditions.  Meningococcal vaccine. You may need this if you have certain  conditions.  Hepatitis A vaccine. You may need this if you have certain conditions or if you travel or work in places where you may be exposed to hepatitis A.  Hepatitis B vaccine. You may need this if you have certain conditions or if you travel or work in places where you may be exposed to hepatitis B.  Haemophilus influenzae type b (Hib) vaccine. You may need this if you have certain conditions.  Talk to your health care provider about which screenings and vaccines you need and how often you need them. This information is not intended to replace advice given to you by your health care provider. Make sure you discuss any questions you have with your health care provider. Document Released: 03/26/2015 Document Revised: 11/17/2015 Document Reviewed: 12/29/2014 Elsevier Interactive Patient Education  2017 [employer].

## 2016-12-07 NOTE — Assessment & Plan Note (Signed)
Likely hot flashes Will check TSH, CBC, CMP UTD on cancer screening - with likely upcoming colonoscopy No other B symptoms Could consider clonidine in the future

## 2016-12-07 NOTE — Progress Notes (Signed)
Subjective:   [name] is a 56 y.o. female who presents for Medicare Annual (Subsequent) preventive examination.  Review of Systems:  N/A Cardiac Risk Factors include: Other (see comment) (prior history of bilateral hip replacements)     Objective:     Vitals: BP 140/88 (BP Location: Right Arm, Patient Position: Sitting, Cuff Size: Normal)   Pulse 88   Temp 98.6 F (37 C) (Oral)   Resp 16   Ht 5\' 4"  (1.626 m)   Wt 155 lb 3.2 oz (70.4 kg)   BMI 26.64 kg/m   Body mass index is 26.64 kg/m.   Tobacco History  Smoking Status  . Never Smoker  Smokeless Tobacco  . Never Used     Counseling given: Not Answered   Past Medical History:  Diagnosis Date  . Abnormal EKG    HX OF INVERTED T WAVES ON EKG, PALPITATIONS, CHEST PAINS-CARDIAC WORK UP DID NOT SHOW ANY HEART DISEASE  . 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 Anemia    Iron Infusion-8 yrs ago  . Anxiety   . Asthma    INHALERS WITH FLARE -UPS  . Cephalalgia 08/18/2014  . Cervical disc disease 08/18/2014   Needs neck surgery.   . Chronic headaches   . DDD (degenerative disc disease)    CERVICAL AND LUMBAR-CHRONIC PAIN, RT HIP LABRAL TEAR  . Depression    PT STATES A LOT OF STRESS IN HER LIFE  . Dizziness 04/22/2013  . Duodenal ulcer with hemorrhage and perforation ([place]) 04/27/2003  . GERD (gastroesophageal reflux disease)   . H/O arthrodesis 08/18/2014  . Headache(784.0)    AND NECK PAIN--STATES RECENT TEST SHOW CERVICAL DEGENERATION  . History of blood transfusion    s/p back surgery  . History of cervical spinal surgery 01/04/2015  . History of kidney stones   . Hypertension   . Inverted T wave   . Narrowing of intervertebral disc space 08/18/2014   Currently on disability.   . Orthostatic hypotension 04/22/2013  . Pain    CHRONIC NECK AND BACK PAIN - LIMITED ROM NECK - S/P FUSIONS CERVICAL AND LUMBAR  . Pneumonia   . PONV (postoperative nausea and vomiting)    PT GIVES HX OF N&V AND FEVER WITH SURGERIES YEARS  AGO--BUT NO PROBLEMS WITH MORE RECENT SURGERIES--STATES NOT MALIGNANT HYPERTHERMIA  . Postop Hyponatremia 05/14/2012  . Postoperative anemia due to acute blood loss 05/14/2012  . Right foot drop   . 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    Past Surgical History:  Procedure Laterality Date  . ABDOMINAL HYSTERECTOMY    . ANTERIOR CERVICAL DECOMP/DISCECTOMY FUSION N/A 10/30/2012   Procedure: ACDF C5-6, EXPLORATION AND HARDWARE REMOVAL C6-7;  Surgeon: Melina Schools, MD;  Location: [place];  Service: Orthopedics;  Laterality: N/A;  . ANTERIOR FUSION CERVICAL SPINE  MAY 2012   AT [facility]  . BACK SURGERY  2009   LUMBAR FUSION WITH RODS   . BREAST BIOPSY Right 2008   benign.- bx/clip  . BREAST EXCISIONAL BIOPSY Left 1998   benign  . BREAST REDUCTION SURGERY Bilateral 06/2016  . CARPAL TUNNEL RELEASE  05-06-12   Right  . CHOLECYSTECTOMY    . DIAGNOSTIC LAPAROSCOPIES - MULTIPLE FOR ENDOMETRIOSIS    . HIP ARTHROSCOPY  09/20/2011   Procedure: ARTHROSCOPY HIP;  Surgeon: Gearlean Alf, MD;  Location: [facility];  Service: Orthopedics;  Laterality: Right;  Right Hip Scope with Labral Debridement  .  NASAL SEPTUM SURGERY  MARCH 2013   IN [place]  . POSTERIOR CERVICAL FUSION/FORAMINOTOMY N/A 04/16/2013   Procedure: REMOVAL CERVICAL PLATES AND INTERBODY CAGE/POSTERIOR CERVICAL SPINAL FUSION C4 - C6/C5 CORPECTOMY/C4 - C6 FUSION WITH ILIAC CREST BONE GRAFT;  Surgeon: Melina Schools, MD;  Location: [place];  Service: Orthopedics;  Laterality: N/A;  . RIGHT HIP ARTHROSCOPY FOR LABRAL TEAR  ABOUT 2010   2012 also  . SHOULDER ARTHROSCOPY  05-06-12   bone spur  . TONSILLECTOMY     AS A CHILD  . TOTAL HIP ARTHROPLASTY Right 05/13/2012   Procedure: TOTAL HIP ARTHROPLASTY ANTERIOR APPROACH;  Surgeon: Gearlean Alf, MD;  Location: [facility];  Service: Orthopedics;  Laterality: Right;   Family History  Problem Relation Age of Onset  . Aneurysm Mother   . Aneurysm Maternal  Grandmother   . Breast cancer Paternal Grandmother 58   History  Sexual Activity  . Sexual activity: Yes    Outpatient Encounter Prescriptions as of 12/07/2016  Medication Sig  . albuterol (PROVENTIL HFA;VENTOLIN HFA) 108 (90 BASE) MCG/ACT inhaler Inhale 2 puffs into the lungs every 6 (six) hours as needed for wheezing.  Marland Kitchen ALPRAZolam (XANAX) 1 MG tablet TAKE 1/2 TAB BY MOUTH EVERY MORNING, 1/2 TAB AT LUNCH, AND 2 TABS AT BEDTIME  . buPROPion (WELLBUTRIN SR) 150 MG 12 hr tablet Take 1 tablet (150 mg total) by mouth 2 (two) times daily.  Marland Kitchen escitalopram (LEXAPRO) 10 MG tablet Take 1 tablet (10 mg total) by mouth at bedtime. Take 1/2 tab PO q h.s. X 1 week then increase to 1 tab PO q h.s.  . fluticasone (FLONASE) 50 MCG/ACT nasal spray Place 2 sprays into both nostrils daily. (Patient taking differently: Place 2 sprays into both nostrils daily as needed. )  . Fluticasone-Salmeterol (ADVAIR DISKUS) 500-50 MCG/DOSE AEPB Inhale 1 puff into the lungs 2 (two) times daily. (Patient taking differently: Inhale 1 puff into the lungs 2 (two) times daily. I puff in the morning and 1 puff at night)  . loratadine (CLARITIN REDITABS) 10 MG dissolvable tablet Take 1 tablet (10 mg total) by mouth daily.  . metoprolol succinate (TOPROL-XL) 50 MG 24 hr tablet TAKE 1 TABLET BY MOUTH EVERY DAY  . montelukast (SINGULAIR) 10 MG tablet TAKE 1 TABLET (10 MG TOTAL) BY MOUTH DAILY.  Marland Kitchen omeprazole (PRILOSEC) 20 MG capsule TAKE ONE CAPSULE BY MOUTH TWICE A DAY  . ondansetron (ZOFRAN) 4 MG tablet Take 1 tablet (4 mg total) by mouth every 8 (eight) hours as needed for nausea or vomiting.  Marland Kitchen Spacer/Aero-Holding Chambers (AEROCHAMBER MV) inhaler by Does not apply route.  . valACYclovir (VALTREX) 1000 MG tablet TAKE TWO TABLETS BY MOUTH TWICE DAILY FOR ONE DAY FEVER BLISTER   No facility-administered encounter medications on file as of 12/07/2016.     Activities of Daily Living In your present state of health, do you have any  difficulty performing the following activities: 12/07/2016 12/30/2015  Hearing? N N  Vision? Y N  Comment recently got new Rx bifocals. Does not feel they assist with vision. Will be scheduloing follow up with eye doctor to discuss -  Difficulty concentrating or making decisions? N Y  Walking or climbing stairs? Y Y  Comment related to previous hip replacements -  Dressing or bathing? N N  Doing errands, shopping? N N  Preparing Food and eating ? N -  Using the Toilet? Y -  Comment related to prior history of bilateral hip replacements -  In the past six months, have you accidently leaked urine? N -  Do you have problems with loss of bowel control? N -  Managing your Medications? N -  Managing your Finances? N -  Housekeeping or managing your Housekeeping? N -  Some recent data might be hidden    Patient Care Team: Jerrol Banana., MD as PCP - General (Family Medicine) Mar Daring, PA-C as Physician Assistant (Family Medicine)    Assessment:     Exercise Activities and Dietary recommendations Current Exercise Habits: Home exercise routine, Type of exercise: walking, Time (Minutes): 60, Frequency (Times/Week): 5, Weekly Exercise (Minutes/Week): 300, Intensity: Mild, Exercise limited by: None identified  Goals    . Increase water intake          Recommend increasing fluid intake to 4-6 glasses each day      Fall Risk Fall Risk  12/07/2016 10/11/2016 07/13/2016 06/19/2016 05/17/2016  Falls in the past year? Yes No No No No  Comment - - - - -  Number falls in past yr: 2 or more - - - -  Comment - - - - -  Injury with Fall? No - - - -  Comment - - - - -  Risk Factor Category  - - - - -  Comment - - - - -  Risk for fall due to : Impaired vision - - - -  Risk for fall due to: Comment needs to see eye doctor to fix glasses; trips occassionally over pet; will see ortho for follow up on hip replacements - - - -  Follow up Falls prevention discussed;Education provided - -  - -  Comment - - - - -   Depression Screen PHQ 2/9 Scores 12/07/2016 10/11/2016 07/13/2016 06/19/2016  PHQ - 2 Score 3 0 0 0  PHQ- 9 Score 11 - - -  Exception Documentation - - - -  Not completed - - - -     Cognitive Function     6CIT Screen 12/07/2016  What Year? 0 points  What month? 0 points  What time? 0 points  Count back from 20 0 points  Months in reverse 0 points  Repeat phrase 0 points  Total Score 0    Immunization History  Administered Date(s) Administered  . Influenza,inj,Quad PF,6+ Mos 12/30/2015, 12/07/2016  . Pneumococcal Polysaccharide-23 12/30/2015   Screening Tests Health Maintenance  Topic Date Due  . HIV Screening  06/01/1974  . TETANUS/TDAP  06/01/1978  . PAP SMEAR  05/31/1980  . COLONOSCOPY  05/31/2009  . INFLUENZA VACCINE  10/11/2016  . MAMMOGRAM  02/16/2018  . Hepatitis C Screening  Completed      Plan:    I have personally reviewed and addressed the Medicare Annual Wellness questionnaire and have noted the following in the patient's chart:  A. Medical and social history B. Use of alcohol, tobacco or illicit drugs  C. Current medications and supplements D. Functional ability and status E.  Nutritional status F.  Physical activity G. Advance directives H. List of other physicians I.  Hospitalizations, surgeries, and ER visits in previous 12 months J.  [place] such as hearing and vision if needed, cognitive and depression L. Referrals and appointments - none  In addition, I have reviewed and discussed with patient certain preventive protocols, quality metrics, and best practice recommendations. A written personalized care plan for preventive services as well as general preventive health recommendations were provided to patient.  See attached  scanned questionnaire for additional information.   Signed,  Aleatha Borer, LPN Nurse Health Advisor   MD Recommendations:

## 2016-12-07 NOTE — Assessment & Plan Note (Signed)
Referral to GI Likely will need EGD Patient agrees to colonoscopy if EGD is required anyways

## 2016-12-08 ENCOUNTER — Telehealth: Payer: Self-pay

## 2016-12-08 LAB — COMPREHENSIVE METABOLIC PANEL
AG RATIO: 1.5 (calc) (ref 1.0–2.5)
ALT: 9 U/L (ref 6–29)
AST: 15 U/L (ref 10–35)
Albumin: 4.3 g/dL (ref 3.6–5.1)
Alkaline phosphatase (APISO): 119 U/L (ref 33–130)
BUN: 11 mg/dL (ref 7–25)
CO2: 27 mmol/L (ref 20–32)
CREATININE: 0.85 mg/dL (ref 0.50–1.05)
Calcium: 9.5 mg/dL (ref 8.6–10.4)
Chloride: 102 mmol/L (ref 98–110)
GLUCOSE: 97 mg/dL (ref 65–99)
Globulin: 2.8 g/dL (calc) (ref 1.9–3.7)
Potassium: 4.4 mmol/L (ref 3.5–5.3)
SODIUM: 139 mmol/L (ref 135–146)
TOTAL PROTEIN: 7.1 g/dL (ref 6.1–8.1)
Total Bilirubin: 0.5 mg/dL (ref 0.2–1.2)

## 2016-12-08 LAB — CBC WITH DIFFERENTIAL/PLATELET
BASOS ABS: 94 {cells}/uL (ref 0–200)
Basophils Relative: 1.4 %
EOS PCT: 0.8 %
Eosinophils Absolute: 54 cells/uL (ref 15–500)
HCT: 37.5 % (ref 35.0–45.0)
HEMOGLOBIN: 12.3 g/dL (ref 11.7–15.5)
Lymphs Abs: 2191 cells/uL (ref 850–3900)
MCH: 27.3 pg (ref 27.0–33.0)
MCHC: 32.8 g/dL (ref 32.0–36.0)
MCV: 83.3 fL (ref 80.0–100.0)
MONOS PCT: 4.2 %
MPV: 8.8 fL (ref 7.5–12.5)
NEUTROS ABS: 4080 {cells}/uL (ref 1500–7800)
Neutrophils Relative %: 60.9 %
Platelets: 381 10*3/uL (ref 140–400)
RBC: 4.5 10*6/uL (ref 3.80–5.10)
RDW: 13.6 % (ref 11.0–15.0)
TOTAL LYMPHOCYTE: 32.7 %
WBC mixed population: 281 cells/uL (ref 200–950)
WBC: 6.7 10*3/uL (ref 3.8–10.8)

## 2016-12-08 LAB — HIV ANTIBODY (ROUTINE TESTING W REFLEX): HIV: NONREACTIVE

## 2016-12-08 LAB — TSH: TSH: 1.3 mIU/L (ref 0.40–4.50)

## 2016-12-08 NOTE — Telephone Encounter (Signed)
Tried calling pt to inform her of results. Phone numbers in chart disconnected. Mailed letter to home asking her to call to update numbers, as well as receive results.

## 2016-12-08 NOTE — Telephone Encounter (Signed)
-----   Message from Virginia Crews, MD sent at 12/08/2016 10:02 AM EDT ----- Normal kidney function, liver function, electrolytes, Thyroid function, Blood counts.  Negative HIV screening  Bacigalupo, Dionne Bucy, MD, MPH [facility] 12/08/2016 10:02 AM

## 2016-12-12 ENCOUNTER — Other Ambulatory Visit: Payer: Self-pay | Admitting: Family Medicine

## 2016-12-12 DIAGNOSIS — J302 Other seasonal allergic rhinitis: Secondary | ICD-10-CM

## 2016-12-12 NOTE — Telephone Encounter (Signed)
LOV 12/07/2016.

## 2016-12-12 NOTE — Progress Notes (Addendum)
Patient's Name: [name]  MRN: [id number]  Referring Provider: Jerrol Banana.,*  DOB: 12/19/1960  PCP: Virginia Crews, MD  DOS: 12/13/2016  Note by: Gaspar Cola, MD  Service setting: Ambulatory outpatient  Specialty: Interventional Pain Management  Location: [facility] (AMB) Pain Management Facility    Patient type: Established   Primary Reason(s) for Visit: Evaluation of chronic illnesses with exacerbation, or progression (Level of risk: moderate) CC: Shoulder Pain (left); Back Pain (upper back); and Hip Pain (left)  HPI  [name] is a 56 y.o. year old, female patient, who comes today for a follow-up evaluation. She has Chronic neck pain; Anxiety state; Asthma, mild; Blurred vision; Benign paroxysmal positional nystagmus; Congenital renal agenesis and dysgenesis; Clinical depression; Colon, diverticulosis; Fatigue; Cannot sleep; IBS (irritable bowel syndrome); Awareness of heartbeats; RAD (reactive airway disease); Apnea, sleep; Esophagitis, reflux; DOE (dyspnea on exertion); Chronic right hip pain; Encounter for therapeutic drug level monitoring; Long term current use of opiate analgesic; Long term prescription opiate use; Opiate use (30 MME/Day); Chronic pain syndrome; Steroid intolerance; Cervical spondylosis; Cervical facet arthropathy (Bilateral); History of cervical spinal surgery; Chronic shoulder pain (Secondary source of pain) (Bilateral) (L>R); 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 [name] was last seen on 11/27/2016. Her primarily concern today is the Shoulder Pain (left); Back Pain (upper back); and Hip Pain (left)  Pain Assessment: Location: Left Shoulder Radiating: down the arm and shoulder blade and up the neck area Onset: More than a month ago Duration: Chronic pain Quality: Aching, Constant, Radiating, Sharp Severity: 3 /10 (self-reported pain score)  Note: Reported level is compatible with observation.                   When using our objective Pain Scale, levels between 6 and 10/10 are said to belong in an emergency room, as it progressively worsens from a 6/10, described as severely limiting, requiring emergency care not usually available at an outpatient pain management facility. At a 6/10 level, communication becomes difficult and requires great effort. Assistance to reach the emergency department may be required. Facial flushing and profuse sweating along with potentially dangerous increases in heart rate and blood pressure will be evident. Timing: Constant  She has completed a 3 month "Drug Holiday". She will go back on her opioid analgesics today, at a lower dose.   Topics covered today: opioid tolerance, "Drug Holidays", [name] primary cause of pain, the treatment plan, treatment alternatives, the risks and possible complications of proposed treatment, medication side effects, the opioid analgesic risks and possible complications, realistic expectations, the goals of pain management (increased in functionality), the medication agreement, the patient's responsibilities when it comes to controlled substances and the need to collect and read the AVS material.  Further details on both, my assessment(s), as well as the proposed treatment  plan, please see below.  Controlled Substance Pharmacotherapy Assessment REMS (Risk Evaluation and Mitigation Strategy)  Analgesic: Oxycodone  IR 5 mg, one tab PO q6hrs. (20 mg/day) MME/day: 30 mg/day.  Lona Millard, RN  12/13/2016 10:11 AM  Sign at close encounter Safety precautions to be maintained throughout the outpatient stay will include: orient to surroundings, keep bed in low position, maintain call bell within reach at all times, provide assistance with transfer out of bed and ambulation.    Pharmacokinetics: Liberation and absorption (onset of action): WNL Distribution (time to peak effect): WNL Metabolism and excretion (duration of action): WNL         Pharmacodynamics: Desired effects: Analgesia: [name] reports >50% benefit. Functional ability: Patient reports that medication allows her to accomplish basic ADLs Clinically meaningful improvement in function (CMIF): Sustained CMIF goals met Perceived effectiveness: Described as relatively effective, allowing for increase in activities of daily living (ADL) Undesirable effects: Side-effects or Adverse reactions: None reported Monitoring: [place] PMP: Online review of the past 70-monthperiod conducted. Compliant with practice rules and regulations Last UDS on record: Summary  Date Value Ref Range Status  07/13/2016 FINAL  Final    Comment:    ==================================================================== TOXASSURE SELECT 13 (MW) ==================================================================== Test                             Result       Flag       Units Drug Present and Declared for Prescription Verification   Alprazolam                     287          EXPECTED   ng/mg creat   Alpha-hydroxyalprazolam        287          EXPECTED   ng/mg creat    Source of alprazolam is a scheduled prescription medication.    Alpha-hydroxyalprazolam is an expected metabolite of alprazolam. Drug Absent but Declared for  Prescription Verification   Oxycodone                      Not Detected UNEXPECTED ng/mg creat ==================================================================== Test                      Result    Flag   Units      Ref Range   Creatinine              78               mg/dL      >=20 ==================================================================== Declared Medications:  The flagging and interpretation on this report are based on the  following declared medications.  Unexpected results may arise from  inaccuracies in the declared medications.  **Note: The testing scope of this panel includes these medications:  Alprazolam  Alprazolam (Xanax)  Oxycodone  **Note: The testing scope of this panel does not include following  reported medications:  Bupropion (Wellbutrin)  Citalopram (Lexapro)  Fluticasone  Loratadine  Metoprolol (Toprol)  Montelukast (Singulair)  Omeprazole (Prilosec)  Ondansetron  Ondansetron (Zofran)  Valacyclovir ==================================================================== For clinical consultation, please call [phone] ====================================================================    UDS interpretation: Unexpected findings not considered significantly abnormal Patient informed of the CDC guidelines and recommendations to stay away from the concomitant use of benzodiazepines and opioids due to the increased risk of respiratory depression and death. Medication Assessment Form: Reviewed. Patient indicates being compliant with therapy Treatment compliance: Compliant Risk Assessment Profile: Aberrant behavior: See  prior evaluations. None observed or detected today Comorbid factors increasing risk of overdose: See prior notes. No additional risks detected today Risk of substance use disorder (SUD): Low     Opioid Risk Tool - 12/13/16 1006      Family History of Substance Abuse   Alcohol Positive Female   Illegal Drugs Negative   Rx Drugs  Negative     Personal History of Substance Abuse   Alcohol Negative   Illegal Drugs Negative   Rx Drugs Negative     Age   Age between 45-45 years  No     History of Preadolescent Sexual Abuse   History of Preadolescent Sexual Abuse Negative or Female     Psychological Disease   Psychological Disease Negative   Depression Positive  on medication     Total Score   Opioid Risk Tool Scoring 4   Opioid Risk Interpretation Moderate Risk     ORT Scoring interpretation table:  Score <3 = Low Risk for SUD  Score between 4-7 = Moderate Risk for SUD  Score >8 = High Risk for Opioid Abuse   Risk Mitigation Strategies:  Patient Counseling: Covered Patient-Prescriber Agreement (PPA): Present and active  Notification to other healthcare providers: Done  Pharmacologic Plan: Today we will take over the chronic pain medication management and from this point on our medication agreement with this patient is active   Laboratory Chemistry  Inflammation Markers (CRP: Acute Phase) (ESR: Chronic Phase) Lab Results  Component Value Date   CRP 0.5 04/05/2015   ESRSEDRATE 37 (H) 04/05/2015                 Renal Function Markers Lab Results  Component Value Date   BUN 11 12/07/2016   CREATININE 0.85 12/07/2016   GFRAA 89 01/27/2016   GFRNONAA 77 01/27/2016                 Hepatic Function Markers Lab Results  Component Value Date   AST 15 12/07/2016   ALT 9 12/07/2016   ALBUMIN 4.3 01/27/2016   ALKPHOS 118 (H) 01/27/2016                 Electrolytes Lab Results  Component Value Date   NA 139 12/07/2016   K 4.4 12/07/2016   CL 102 12/07/2016   CALCIUM 9.5 12/07/2016   MG 2.1 04/05/2015                 Neuropathy Markers No results found for: [id number]               Bone Pathology Markers Lab Results  Component Value Date   ALKPHOS 118 (H) 01/27/2016   CALCIUM 9.5 12/07/2016                 Coagulation Parameters Lab Results  Component Value Date   INR 1.05  03/06/2015   LABPROT 13.9 03/06/2015   APTT 33 03/06/2015   PLT 381 12/07/2016                 Cardiovascular Markers Lab Results  Component Value Date   HGB 12.3 12/07/2016   HCT 37.5 12/07/2016                 Note: Lab results reviewed.  Recent Diagnostic Imaging Review  Cervical Imaging: Cervical MR wo contrast:  Results for orders placed during the hospital encounter of 04/16/13  MR Cervical Spine Wo Contrast   Narrative CLINICAL DATA:  Right-sided  weakness and numbness post anterior and posterior cervical fusion.  EXAM: MRI CERVICAL SPINE WITHOUT CONTRAST  TECHNIQUE: Multiplanar, multisequence MR imaging was performed. No intravenous contrast was administered.  COMPARISON:  Prior CT performed earlier on the same day and.  FINDINGS: The visualized portions of the brain and posterior fossa demonstrate a normal appearance with normal signal intensity. The craniocervical junction is widely patent.  The vertebral bodies are normally aligned with preservation of the normal cervical lordosis. Vertebral body heights are preserved.  Postoperative changes from recent C5 corpectomy with anterior plate screw fixation at C4 through C6. Posterior spinal fusion with bilateral transpedicular screws at C4 and C6 are present as well. The hardware appears well positioned without evidence of complication. Hyperintense T2 signal intensity within the posterior paraspinous soft tissues at the level of C3 through C6 is compatible likely postoperative edema. Small amount of fluid is also seen within the prevertebral soft tissues anterior to the cervical spine, also compatible with postoperative edema/effusion. No loculated fluid collection identified. No epidural hematoma. The cervical spinal cord itself demonstrates a normal appearance with normal signal intensity without evidence of injury, although evaluation at the level of the cervical fusion is somewhat limited due  to susceptibility artifact. No significant canal or neural foraminal stenosis identified at the C4 through C6 levels.  At C2-3, there is no significant canal or neural foraminal stenosis.  At C3-4, no significant degenerative disc disease. No canal or neural foraminal stenosis.  At C4-5, there is mild degenerative osteophytosis without significant canal or neural foraminal stenosis. No evidence of postoperative complication.  At C5-6, there is mild diffuse degenerative disc osteophytosis without significant canal or neural foraminal stenosis. No evidence of postoperative complication.  At C6-7, there is mild diffuse degenerative osteophytosis without significant canal or neural foraminal stenosis. No evidence of postoperative complication.  At C7-T1, minimal degenerative disc osteophyte and bilateral facet arthrosis. No canal or neural foraminal stenosis.  IMPRESSION: 1. Postoperative sequelae of recent cervical spinal fusion without complication. No evidence of epidural hematoma, cord injury, or significant canal or foraminal stenosis identified. 2. Hyperintense T2 signal intensity within the prevertebral soft tissues anterior the cervical spine as well as within the posterior is paraspinous soft tissues at C3 through C6, likely related to recent surgery. No loculated fluid collection identified.  Critical Value/emergent results were called by telephone at the time of interpretation on 04/17/2013 at 1:14 AM to Dr. Melina Schools , who verbally acknowledged these results.   Electronically Signed   By: Jeannine Boga M.D.   On: 04/17/2013 01:16    Cervical MR w/wo contrast:  Results for orders placed during the hospital encounter of 08/21/11  MR Cervical Spine W Wo Contrast   Addendum **ADDENDUM** CREATED: 08/21/2011 15:13:15  The original report was by Dr. Van Clines.  The following addendum is by Dr. Van Clines:  "BUN and creatinine were obtained on site  at [place] at [address]  Test results for your reference are:   Creatinine: 0.6   Bun:        7  **END ADDENDUM** SIGNED BY: Lynnell Chad. Janeece Fitting, M.D.      Carron Curie, MD 08/21/2011  3:15 PM         Narrative *RADIOLOGY REPORT*  Clinical Data: Neck pain.  Headaches.  Right arm pain.  Prior cervical spine surgery.  MRI CERVICAL SPINE WITHOUT AND WITH CONTRAST  Technique:  Multiplanar and multiecho pulse sequences of the cervical spine, to include the  craniocervical junction and cervicothoracic junction, were obtained according to standard protocol without and with intravenous contrast.  Contrast: 4m MULTIHANCE GADOBENATE DIMEGLUMINE 529 MG/ML IV SOLN  Comparison: Multiple exams, including 08/14/2011 and 04/22/2010  Findings: The craniocervical junction appears unremarkable.  Metal artifact associated with plate screw fixator noted at C6-7.  No significant vertebral subluxation.  Low-level vertebral edema is present in the C5 vertebral body, increased from the prior exam.  No significant abnormal cord signal is identified.  There is 2 mm of degenerative anterior subluxation of C3-4 and C4- 5.  Additional findings at individual levels are as follows:  C2-3:  No impingement.  The right facet joint is probably fused.  C3-4:  Mild right foraminal stenosis secondary to facet and uncinate spurring.  C4-5: Borderline right foraminal stenosis secondary to facet and uncinate spurring. Disc bulge noted.  C5-6:  Mild right foraminal stenosis due to facet and uncinate spurring.  C6-7:  Mild right foraminal stenosis due to uncinate spurring. This level is fused.  C7-T1:  Unremarkable.  T1-2:  Unremarkable.  IMPRESSION:  1.  Mild right-sided foraminal impingement at C2-4, C5-6, and C6-7 primarily due to spurring. 2.  Low-level edema in the C5 vertebral body is probably related to degenerative endplate findings at CP3-8  Original Report  Authenticated By: WCarron Curie M.D.   Cervical CT wo contrast:  Results for orders placed during the hospital encounter of 02/28/13  CT Cervical Spine Wo Contrast   Narrative CLINICAL DATA:  Dysphagia with bilateral arm numbness. Patient underwent cervical fusion 10/30/2012 and fell five days ago.  EXAM: CT CERVICAL SPINE WITHOUT CONTRAST  TECHNIQUE: Multidetector CT imaging of the cervical spine was performed without intravenous contrast. Multiplanar CT image reconstructions were also generated.  COMPARISON:  DG CERVICAL SPINE 2-3 VIEWS dated 10/30/2012; DG C-ARM 1-60 MIN dated 10/30/2012; MR C SPINE WO/W CM dated 08/21/2011  FINDINGS: Patient underwent revision of cervical fusion 10/30/2012 with C5-6 ACDF. Both C5 screws are now superiorly displaced into the C4-5 disc. There is lucency surrounding the C5 screws. The C6 screws are intact and well positioned. There is no displacement of the anterior plate or interbody metallic spacer. No solid interbody fusion is present at C5-6.  There is solid interbody fusion at C6-7. The C7 vertebral body screws have been removed.  The alignment is normal. There is no evidence of acute fracture or paraspinal hematoma.  IMPRESSION: 1. Both C5 screws are now superiorly displaced into the C4-5 disc space, a change from the intraoperative radiographs. The anterior plate and interbody spacer are stable in position. 2. No solid interbody fusion at C5-6. 3. Previous C6-7 fusion appears solid. 4. No evidence acute fracture or malalignment.   Electronically Signed   By: BCamie PatienceM.D.   On: 02/28/2013 15:00    Cervical DG 2-3 views:  Results for orders placed during the hospital encounter of 07/20/10  DG Cervical Spine 2-3 Views   Narrative *RADIOLOGY REPORT*  Clinical Data: Status post C6-7 ACDF  CERVICAL SPINE - 2-3 VIEW  Comparison: Intraoperative imaging  Findings: The superior and midportions of the ACDF hardware at  C6- C7 are within normal limits.  The inferior portion of the hardware is obscured by soft tissue artifact.  Allowing for this, there is no evidence for hardware failure.  Minimal reversal of the cervical lordosis at C4-C5 is noted.  IMPRESSION: No visualized hardware abnormality, allowing for incomplete visualization of the inferior aspect of the hardware on the lateral view.  Original  Report Authenticated By: Arline Asp, M.D.   Cervical DG complete:  Results for orders placed in visit on 05/03/07  DG Cervical Spine Complete   Narrative * PRIOR REPORT IMPORTED FROM AN EXTERNAL SYSTEM *   PRIOR REPORT IMPORTED FROM THE [facility] WORKFLOW SYSTEM   REASON FOR EXAM:    shoulder pain  COMMENTS:   PROCEDURE:     MDR - MDR CERVICAL SPINE COMPLETE  - May 03 2007 12:38PM   RESULT:     The vertebral body heights and the intervertebral disc spaces  are well maintained. The vertebral body alignment is normal. Oblique views  show mild spur impingement on the neural foramina at C6-C7 on the right.  The  neural foramina otherwise are widely patent bilaterally. The odontoid  process is intact.   IMPRESSION:  1. No fracture is seen.  2. Oblique views show slight spur impingement on the neural foramina at  C6-C7 on the right.       Shoulder Imaging: Shoulder-R MR wo contrast:  Results for orders placed in visit on 10/19/15  MR Shoulder Right Wo Contrast   Shoulder-L MR wo contrast:  Results for orders placed in visit on 10/19/15  MR Shoulder Left Wo Contrast   Shoulder-R DG:  Results for orders placed in visit on 05/03/07  DG Shoulder Right   Narrative * PRIOR REPORT IMPORTED FROM AN EXTERNAL SYSTEM *   PRIOR REPORT IMPORTED FROM THE [facility] EXAM:    shoulder pain  COMMENTS:   PROCEDURE:     MDR - MDR SHOULDER RIGHT COMPLETE  - May 03 2007 12:27PM   RESULT:     No fracture, dislocation or other significant osseous  abnormality is identified.    IMPRESSION:  1. No significant abnormalities are noted.       Thoracic Imaging: Thoracic CT wo contrast:  Results for orders placed during the hospital encounter of 01/08/08  CT Thoracic Spine Wo Contrast   Narrative Clinical Data: T10-L4 fusion.  Back and leg pain.   CT THORACIC SPINE WITHOUT CONTRAST   Technique:  Multidetector CT imaging of the thoracic spine was performed without intravenous contrast administration. Multiplanar CT image reconstructions were also generated   Comparison: Multiple lumbar radiographs and CT examinations earlier this year.   Findings: Posterior fusion bone graft material is present beginning at the T8-9 level bilaterally extending through the operative region.   From T10-L4, there has been placement of bilateral pedicle screws and posterior rods with two crossing bars.  At T10, the pedicle screws are somewhat laterally positioned, more on the right than the left.  No canal encroachment.  Slight lateral positioning is also noted at T11.  No canal encroachment.  Below that, good pedicular  location is achieved at each level without encroachment upon any of the neural spaces.  Nothing encroaches upon the spinal canal at any level.   Changes related to previous anterior discectomy and fusion at L2-3 and L3-4 appear stable.   IMPRESSION: Posterior fusion from T10-L4 with pedicle screws, posterior rods and posterior fusion bone graft placement.  No apparent operative complication.  See above discussion.  Provider: Wilburt Finlay   Lumbosacral Imaging: Lumbar CT wo contrast:  Results for orders placed during the hospital encounter of 01/08/08  CT Lumbar Spine Wo Contrast   Narrative Clinical Data: Scoliosis and back pain.  A L I F L2-3 and L3-4.   CT LUMBAR SPINE WITHOUT CONTRAST   Technique:  Multidetector  CT imaging of the lumbar spine was performed without intravenous contrast administration. Multiplanar CT image reconstructions  were also generated.   Comparison: Operative images 10/28.  Previous CT 08/24.   Findings: T12-L1:  Unremarkable.   L1-2:  Unremarkable.   L2-3:  There has been fusion at this level from an anterior approach.  Interbody fusion material appears well positioned. Spinal canal and foramina appear widely patent.   L3-4:  There has been fusion at this level from an anterior approach.  Interbody fusion material appears well positioned. Canal and foramina appear widely patent.   L4-5:  Minimal disc bulge but no herniation or stenosis.   L5-S1:  Disc bulge slightly more towards the left.  Mild facet degeneration bilaterally.  No definite neural compression.   One can appreciate changes related to the retroperitoneal approach on the left.  Multiple surgical clips are present.  There is mild retroperitoneal edema as expected.  Note is made that the left kidney is larger than the right.  This is presumably chronic.  The left renal collecting system is slightly full but I do not see definite hydronephrosis.   IMPRESSION: Discectomy and fusion from an anterior approach at L2-3 and L3-4. See above discussion.  Provider: Wilburt Finlay   Lumbar CT w contrast:  Results for orders placed during the hospital encounter of 10/08/12  CT Lumbar Spine W Contrast   Narrative *RADIOLOGY REPORT*  Clinical Data:  Back pain  CT MYELOGRAPHY LUMBAR SPINE  Technique:  CT imaging of the lumbar spine was performed after intrathecal contrast administration.  Multiplanar CT image reconstructions were also generated.  Comparison:  01/10/2008  Findings: Radiologist injection.  Anatomic alignment of the vertebral bodies.  Bilateral pedicle screws are in place extending from L4 into the thoracic spine with cross stabilizing bars.  Solid bony callus along the posterior elements is noted.  Disc spacers are in place at L2-3 and L3-4 with solid trans disc space effusion.  No breakage or loosening of  the hardware.  The conus medullaris terminates at the L1-2 disc.  No new vertebral compression deformity.  Wedging at L3 is stable.  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  L1-2:  Fused level.  Central canal and foramina are widely patent.  L2-3:  Fused level.  Central canal and foramina are widely patent.  L3-4.  Fused level.  Central canal and foramina are widely patent.  L4-5:  Mild facet arthropathy and ligamentum flavum hypertrophy. Mild concentric disc bulge.  No significant central or lateral recess narrowing foramina patent.  L5 S1:  Shallow central protrusion.  No lateral recess or central canal narrowing.  Patent foramina.  Very minimal facet arthropathy.  IMPRESSION: Solid thoracolumbar fusion through L4.  Mild degenerative disc disease at L4-5 and L5 S1 without evidence of impingement.  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   Original Report Authenticated By: Marybelle Killings, M.D.    Lumbar DG 2-3 views:  Results for orders placed during the hospital encounter of 01/08/08  DG Lumbar Spine 2-3 Views   Narrative Clinical Data: Degenerative scoliosis with back pain.   LUMBAR SPINE - 2-3 VIEW   Comparison: 01/02/2008.   Findings: Two intraoperative prone views of the thoracolumbar junction are submitted.  Both films show an esophageal temperature probe and NG tube in place.  The patient has extensive thoracolumbar fusion beginning at the level of T10 (assuming 12 rib- bearing vertebral bodies) and extending to at least the level of L4.  The left-sided fusion rod ends at the L4 pedicle screw, but the caudal most extent of the right-sided hardware has not been visualized.  Intervertebral graft material is seen at L2-3 and L3- 4.   IMPRESSION: Extensive thoracolumbar fusion with incomplete visualization of the right-sided spinal hardware.  No evidence for immediate hardware complications within the visualized  anatomy.  Provider: Roxan Hockey Ward   Lumbar DG (Complete) 4+V:  Results for orders placed in visit on 05/03/07  DG Lumbar Spine Complete   Narrative * PRIOR REPORT IMPORTED FROM AN EXTERNAL SYSTEM *   PRIOR REPORT IMPORTED FROM THE SYNGO [place] EXAM:    shoulder pain  COMMENTS:   PROCEDURE:     MDR - MDR LUMBAR SPINE WITH OBLIQUES  - May 03 2007 12:34PM   RESULT:     The vertebral body heights and the intervertebral disc spaces  are well maintained. The vertebral body alignment is normal. Oblique views  show no significant abnormalities of the articular facets. There is mild  degenerative spurring anteriorly at multiple levels. The pedicles are  bilaterally intact. There is a 15 degrees lumbar scoliosis with convexity  to  the right.   IMPRESSION:  1. No fracture or other acute bony abnormality is identified.  2. There is a mild lumbar scoliosis.       Lumbar DG Myelogram views:  Results for orders placed during the hospital encounter of 10/08/12  DG Myelogram Lumbar   Narrative *RADIOLOGY REPORT*  Clinical Data:  Back pain  MYELOGRAM LUMBAR  Technique: The procedure, risks, benefits, and alternatives were explained to the patient. The patient understands and consents. Under fluoroscopic guidance, a 22 gauge spinal needle was placed in the CSF space via left L5-S1 approach. 20 mL of Omnipaque 180 was injected.  Findings: Bilateral pedicle screws and cross stabilizing bars are present in L4, and L3, L2, L1, T12, T11, and above the upper limit of the study.  There is no breakage or loosening of the hardware. There is also anatomic alignment.   Mild wedging at L3 is stable compared with March 21, 2007.  Disc spacers are in place in the interposed   levels at L2-3 and L3-4.  There is very minimal anterior epidural mass effect at L4-5.  No mass effect at L5-S1.  The central canal is widely patent throughout the  lumbar spine.  Flexion and extension views demonstrate no evidence of abnormal motion to suggest instability. There is very limited range of motion in the lumbar spine.  Complications: None.  Fluoroscopy Time: 1 minute and 8 seconds.  IMPRESSION: Thoracolumbar fusion as described.  No evidence of spinal stenosis or hardware failure.   Original Report Authenticated By: Marybelle Killings, M.D.    Hip Imaging: Hip-R MR w contrast:  Results for orders placed during the hospital encounter of 07/26/11  MR Hip Right W Contrast   Narrative *RADIOLOGY REPORT*  Clinical Data: History of torn right labrum 3 years ago.  History of labral repair.  Recurrent hip and groin pain.  MRI WITH INFUSION OF THE RIGHT HIP (MR ARTHROGRAM)  Technique:  Multiplanar, multisequence MR imaging of right hip was performed following the administration of intraarticular contrast.  Comparison: 07/18/2010.  Findings:  There is recurrent tear of the anterior superior labrum of the right hip.  This is at the 10 o'clock position of the acetabular labrum.  Contrast tracks between the acetabulum and labral soft tissue structures.  Minimal chondromalacia  is present.  When comparing today's examination to the prior exam of 07/18/2010, the labrum is not completely detached at the 10 o'clock position. The visceral pelvis appears within normal limits.  IMPRESSION: Recurrent anterior superior labral tear at the 10 o'clock position. Mild right hip osteoarthritis.  Original Report Authenticated By: Dereck Ligas, M.D.   Note: Results of ordered imaging test(s) reviewed and explained to patient in Layman's terms. Copy of results provided to patient.  Meds   Current Outpatient Prescriptions:  .  albuterol (PROVENTIL HFA;VENTOLIN HFA) 108 (90 BASE) MCG/ACT inhaler, Inhale 2 puffs into the lungs every 6 (six) hours as needed for wheezing., Disp: , Rfl:  .  ALPRAZolam (XANAX) 1 MG tablet, TAKE 1/2 TAB BY MOUTH EVERY  MORNING, 1/2 TAB AT LUNCH, AND 2 TABS AT BEDTIME, Disp: 90 tablet, Rfl: 5 .  buPROPion (WELLBUTRIN XL) 300 MG 24 hr tablet, Take 1 tablet (300 mg total) by mouth daily., Disp: 30 tablet, Rfl: 1 .  escitalopram (LEXAPRO) 20 MG tablet, Take 1 tablet (20 mg total) by mouth at bedtime. (Patient taking differently: Take 40 mg by mouth at bedtime. ), Disp: 90 tablet, Rfl: 3 .  fluticasone (FLONASE) 50 MCG/ACT nasal spray, Place 2 sprays into both nostrils daily. (Patient taking differently: Place 2 sprays into both nostrils daily as needed. ), Disp: 16 g, Rfl: 3 .  Fluticasone-Salmeterol (ADVAIR DISKUS) 500-50 MCG/DOSE AEPB, Inhale 1 puff into the lungs 2 (two) times daily. (Patient taking differently: Inhale 1 puff into the lungs 2 (two) times daily. I puff in the morning and 1 puff at night), Disp: 60 each, Rfl: 5 .  loratadine (CLARITIN REDITABS) 10 MG dissolvable tablet, Take 1 tablet (10 mg total) by mouth daily., Disp: 90 tablet, Rfl: 3 .  metoprolol succinate (TOPROL-XL) 50 MG 24 hr tablet, TAKE 1 TABLET BY MOUTH EVERY DAY, Disp: 90 tablet, Rfl: 1 .  montelukast (SINGULAIR) 10 MG tablet, TAKE 1 TABLET (10 MG TOTAL) BY MOUTH DAILY., Disp: 90 tablet, Rfl: 1 .  omeprazole (PRILOSEC) 20 MG capsule, TAKE ONE CAPSULE BY MOUTH TWICE A DAY, Disp: 60 capsule, Rfl: 5 .  ondansetron (ZOFRAN) 4 MG tablet, Take 1 tablet (4 mg total) by mouth every 8 (eight) hours as needed for nausea or vomiting., Disp: 20 tablet, Rfl: 0 .  oxyCODONE (OXY IR/ROXICODONE) 5 MG immediate release tablet, Take 1 tablet (5 mg total) by mouth every 6 (six) hours as needed for severe pain., Disp: 120 tablet, Rfl: 0 .  [START ON 01/12/2017] oxyCODONE (OXY IR/ROXICODONE) 5 MG immediate release tablet, Take 1 tablet (5 mg total) by mouth every 6 (six) hours as needed for severe pain., Disp: 120 tablet, Rfl: 0 .  [START ON 02/11/2017] oxyCODONE (OXY IR/ROXICODONE) 5 MG immediate release tablet, Take 1 tablet (5 mg total) by mouth every 6 (six)  hours as needed for severe pain., Disp: 120 tablet, Rfl: 0 .  Spacer/Aero-Holding Chambers (AEROCHAMBER MV) inhaler, by Does not apply route., Disp: , Rfl:  .  valACYclovir (VALTREX) 1000 MG tablet, TAKE TWO TABLETS BY MOUTH TWICE DAILY FOR ONE DAY FEVER BLISTER, Disp: 4 tablet, Rfl: 5  ROS  Constitutional: Denies any fever or chills Gastrointestinal: No reported hemesis, hematochezia, vomiting, or acute GI distress Musculoskeletal: Denies any acute onset joint swelling, redness, loss of ROM, or weakness Neurological: No reported episodes of acute onset apraxia, aphasia, dysarthria, agnosia, amnesia, paralysis, loss of coordination, or loss of consciousness  Allergies  [name] is allergic to amoxicillin; clindamycin;  codeine; sulfa antibiotics; shellfish allergy; decadron [dexamethasone]; papaya derivatives; prednisone; betadine [povidone iodine]; chlorhexidine; clarithromycin; clindamycin/lincomycin; and other.  [place]  Drug: [name]  reports that she does not use drugs. Alcohol:  reports that she does not drink alcohol. Tobacco:  reports that she has never smoked. She has never used smokeless tobacco. 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); Anemia; Anxiety; Asthma; Cephalalgia (08/18/2014); Cervical disc disease (08/18/2014); Chronic headaches; DDD (degenerative disc disease); Depression; Dizziness (04/22/2013); Duodenal ulcer with hemorrhage and perforation ([place]) (04/27/2003); GERD (gastroesophageal reflux disease); H/O arthrodesis (08/18/2014); Headache(784.0); History of blood transfusion; History of cervical spinal surgery (01/04/2015); History of kidney stones; Hypertension; Inverted T wave; Narrowing of intervertebral disc space (08/18/2014); Orthostatic hypotension (04/22/2013); Pain; Pneumonia; PONV (postoperative nausea and vomiting); Postop Hyponatremia (05/14/2012); Postoperative anemia due to acute blood loss (05/14/2012); Right foot drop; and Right hip  arthralgia (08/18/2014). Surgical: [name]  has a past surgical history that includes RIGHT HIP ARTHROSCOPY FOR LABRAL TEAR (ABOUT 2010); Anterior fusion cervical spine (MAY 2012); Nasal septum surgery (MARCH 2013); Back surgery (2009); Tonsillectomy; Abdominal hysterectomy; DIAGNOSTIC LAPAROSCOPIES - MULTIPLE FOR ENDOMETRIOSIS; Cholecystectomy; Hip arthroscopy (09/20/2011); Shoulder arthroscopy (05-06-12); Carpal tunnel release (05-06-12); Total hip arthroplasty (Right, 05/13/2012); Anterior cervical decomp/discectomy fusion (N/A, 10/30/2012); Posterior cervical fusion/foraminotomy (N/A, 04/16/2013); Breast biopsy (Right, 2008); Breast excisional biopsy (Left, 1998); and Breast reduction surgery (Bilateral, 06/2016). Family: family history includes Aneurysm in her maternal grandmother and mother; Breast cancer (age of onset: 36) in her paternal grandmother.  Constitutional Exam  General appearance: Well nourished, well developed, and well hydrated. In no apparent acute distress Vitals:   12/13/16 0957  BP: (!) 146/102  Pulse: 79  Resp: 16  Temp: 98.1 F (36.7 C)  SpO2: 100%  Weight: 155 lb (70.3 kg)  Height: 5' 4"  (1.626 m)   BMI Assessment: Estimated body mass index is 26.61 kg/m as calculated from the following:   Height as of this encounter: 5' 4"  (1.626 m).   Weight as of this encounter: 155 lb (70.3 kg).  BMI interpretation table: BMI level Category Range association with higher incidence of chronic pain  <18 kg/m2 Underweight   18.5-24.9 kg/m2 Ideal body weight   25-29.9 kg/m2 Overweight Increased incidence by 20%  30-34.9 kg/m2 Obese (Class I) Increased incidence by 68%  35-39.9 kg/m2 Severe obesity (Class II) Increased incidence by 136%  >40 kg/m2 Extreme obesity (Class III) Increased incidence by 254%   BMI Readings from Last 4 Encounters:  12/13/16 26.61 kg/m  12/07/16 26.61 kg/m  12/07/16 26.64 kg/m  10/27/16 26.09 kg/m   Wt Readings from Last 4 Encounters:  12/13/16  155 lb (70.3 kg)  12/07/16 155 lb (70.3 kg)  12/07/16 155 lb 3.2 oz (70.4 kg)  10/27/16 152 lb (68.9 kg)  Psych/Mental status: Alert, oriented x 3 (person, place, & time)       Eyes: PERLA Respiratory: No evidence of acute respiratory distress  Cervical Spine Area Exam  Skin & Axial Inspection: No masses, redness, edema, swelling, or associated skin lesions Alignment: Symmetrical Functional ROM: Unrestricted ROM      Stability: No instability detected Muscle Tone/Strength: Functionally intact. No obvious neuro-muscular anomalies detected. Sensory (Neurological): Unimpaired Palpation: No palpable anomalies              Upper Extremity (UE) Exam    Side: Right upper extremity  Side: Left upper extremity  Skin & Extremity Inspection: Skin color, temperature, and hair growth are WNL. No peripheral edema  or cyanosis. No masses, redness, swelling, asymmetry, or associated skin lesions. No contractures.  Skin & Extremity Inspection: Skin color, temperature, and hair growth are WNL. No peripheral edema or cyanosis. No masses, redness, swelling, asymmetry, or associated skin lesions. No contractures.  Functional ROM: Decreased ROM for shoulder  Functional ROM: Decreased ROM for shoulder  Muscle Tone/Strength: Functionally intact. No obvious neuro-muscular anomalies detected.  Muscle Tone/Strength: Functionally intact. No obvious neuro-muscular anomalies detected.  Sensory (Neurological): Unimpaired          Sensory (Neurological): Unimpaired          Palpation: No palpable anomalies              Palpation: No palpable anomalies              Specialized Test(s): Deferred         Specialized Test(s): Deferred          Thoracic Spine Area Exam  Skin & Axial Inspection: No masses, redness, or swelling Alignment: Symmetrical Functional ROM: Unrestricted ROM Stability: No instability detected Muscle Tone/Strength: Functionally intact. No obvious neuro-muscular anomalies detected. Sensory  (Neurological): Unimpaired Muscle strength & Tone: No palpable anomalies  Lumbar Spine Area Exam  Skin & Axial Inspection: No masses, redness, or swelling Alignment: Symmetrical Functional ROM: Unrestricted ROM      Stability: No instability detected Muscle Tone/Strength: Functionally intact. No obvious neuro-muscular anomalies detected. Sensory (Neurological): Unimpaired Palpation: No palpable anomalies       Provocative Tests: Lumbar Hyperextension and rotation test: evaluation deferred today       Lumbar Lateral bending test: evaluation deferred today       Patrick's Maneuver: evaluation deferred today                    Gait & Posture Assessment  Ambulation: Unassisted Gait: Relatively normal for age and body habitus Posture: WNL   Lower Extremity Exam    Side: Right lower extremity  Side: Left lower extremity  Skin & Extremity Inspection: Skin color, temperature, and hair growth are WNL. No peripheral edema or cyanosis. No masses, redness, swelling, asymmetry, or associated skin lesions. No contractures.  Skin & Extremity Inspection: Skin color, temperature, and hair growth are WNL. No peripheral edema or cyanosis. No masses, redness, swelling, asymmetry, or associated skin lesions. No contractures.  Functional ROM: Unrestricted ROM          Functional ROM: Unrestricted ROM          Muscle Tone/Strength: Functionally intact. No obvious neuro-muscular anomalies detected.  Muscle Tone/Strength: Functionally intact. No obvious neuro-muscular anomalies detected.  Sensory (Neurological): Unimpaired  Sensory (Neurological): Unimpaired  Palpation: No palpable anomalies  Palpation: No palpable anomalies   Assessment  Primary Diagnosis & Pertinent Problem List: The primary encounter diagnosis was Chronic shoulder pain (Secondary source of pain) (Bilateral) (L>R). 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  Status Diagnosis  Controlled Controlled Controlled 1. Chronic shoulder pain (Secondary source of pain) (Bilateral) (L>R)   2. 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 Cervical facet syndrome (bilateral)   4. Chronic pain syndrome   5. Long term prescription opiate use   6. Opiate use (30 MME/Day)     Problems updated and reviewed during this visit: Problem  Cervical facet arthropathy (Bilateral)  Cervical facet syndrome (Bilateral)  Opiate use (30 MME/Day)  Time Note: Greater than 50% of the 40 minute(s) of face-to-face time spent with [name], was spent in counseling/coordination of care regarding: opioid tolerance, "Drug Holidays", [name] primary cause of pain, the treatment plan, treatment alternatives, the risks and possible complications of proposed treatment, medication side effects, going over the informed consent, the opioid analgesic risks and possible complications, the medication agreement, the patient's responsibilities when it comes to controlled substances and the need to collect and read the AVS material.  Plan of Care  Pharmacotherapy (Medications Ordered): Meds ordered this encounter  Medications  . oxyCODONE (OXY IR/ROXICODONE) 5 MG immediate release tablet    Sig: Take 1 tablet (5 mg total) by mouth every 6 (six) hours as needed for severe pain.    Dispense:  120 tablet    Refill:  0    Fill one day early if pharmacy is closed on scheduled refill date. Do not fill until: 12/13/16 To last until: 01/12/17  . oxyCODONE (OXY IR/ROXICODONE) 5 MG immediate release tablet    Sig: Take 1 tablet (5 mg total) by mouth every 6 (six) hours as needed for severe pain.    Dispense:  120 tablet    Refill:  0    Fill one day early if pharmacy is closed on scheduled refill date. Do not fill until: 01/12/17 To last until: 02/11/17  . oxyCODONE (OXY IR/ROXICODONE) 5 MG  immediate release tablet    Sig: Take 1 tablet (5 mg total) by mouth every 6 (six) hours as needed for severe pain.    Dispense:  120 tablet    Refill:  0    Fill one day early if pharmacy is closed on scheduled refill date. Do not fill until: 02/11/17 To last until: 03/13/16   New Prescriptions   OXYCODONE (OXY IR/ROXICODONE) 5 MG IMMEDIATE RELEASE TABLET    Take 1 tablet (5 mg total) by mouth every 6 (six) hours as needed for severe pain.   OXYCODONE (OXY IR/ROXICODONE) 5 MG IMMEDIATE RELEASE TABLET    Take 1 tablet (5 mg total) by mouth every 6 (six) hours as needed for severe pain.   OXYCODONE (OXY IR/ROXICODONE) 5 MG IMMEDIATE RELEASE TABLET    Take 1 tablet (5 mg total) by mouth every 6 (six) hours as needed for severe pain.   Medications administered today: [name] had no medications administered during this visit.   Procedure Orders     Radiofrequency Shoulder Joint Lab Orders  No laboratory test(s) ordered today   Imaging Orders  No imaging studies ordered today   Referral Orders  No referral(s) requested today    Interventional management options: Planned, scheduled, and/or pending:   Therapeutic bilateral suprascapular nerve RFA, starting with the left side    Considering:   Palliative bilateral suprascapular nerve block  Possible bilateral suprascapular nerve RFA    Palliative PRN treatment(s):   None at this time   Provider-requested follow-up: Return for RFA (fluoro + sedation): (B) Suprascapular (01/04/17).  Future Appointments Date Time Provider [place]  01/04/2017 10:30 AM Milinda Pointer, MD [facility] None  01/10/2017 9:30 AM Lucilla Lame, MD [facility] None  01/19/2017 4:00 PM Virginia Crews, MD [facility] None  02/28/2017 10:30 AM Vevelyn Francois, NP [facility] None  12/10/2017 9:30 AM [employer] HEALTH ADVISOR [facility] None  12/10/2017 10:00 AM Bacigalupo, Dionne Bucy, MD [facility] None   Primary Care Physician: Virginia Crews,  MD Location: [facility] Note by: Gaspar Cola, MD Date:  12/13/2016; Time: 3:05 PM

## 2016-12-13 ENCOUNTER — Encounter: Payer: Self-pay | Admitting: Pain Medicine

## 2016-12-13 ENCOUNTER — Ambulatory Visit: Payer: Medicare Other | Attending: Pain Medicine | Admitting: Pain Medicine

## 2016-12-13 VITALS — BP 146/102 | HR 79 | Temp 98.1°F | Resp 16 | Ht 64.0 in | Wt 155.0 lb

## 2016-12-13 DIAGNOSIS — M47812 Spondylosis without myelopathy or radiculopathy, cervical region: Secondary | ICD-10-CM | POA: Diagnosis not present

## 2016-12-13 DIAGNOSIS — M48061 Spinal stenosis, lumbar region without neurogenic claudication: Secondary | ICD-10-CM | POA: Insufficient documentation

## 2016-12-13 DIAGNOSIS — R1310 Dysphagia, unspecified: Secondary | ICD-10-CM | POA: Diagnosis not present

## 2016-12-13 DIAGNOSIS — J45909 Unspecified asthma, uncomplicated: Secondary | ICD-10-CM | POA: Insufficient documentation

## 2016-12-13 DIAGNOSIS — M25552 Pain in left hip: Secondary | ICD-10-CM | POA: Insufficient documentation

## 2016-12-13 DIAGNOSIS — M19012 Primary osteoarthritis, left shoulder: Secondary | ICD-10-CM | POA: Insufficient documentation

## 2016-12-13 DIAGNOSIS — M25551 Pain in right hip: Secondary | ICD-10-CM | POA: Insufficient documentation

## 2016-12-13 DIAGNOSIS — I10 Essential (primary) hypertension: Secondary | ICD-10-CM | POA: Insufficient documentation

## 2016-12-13 DIAGNOSIS — K589 Irritable bowel syndrome without diarrhea: Secondary | ICD-10-CM | POA: Diagnosis not present

## 2016-12-13 DIAGNOSIS — M21371 Foot drop, right foot: Secondary | ICD-10-CM | POA: Diagnosis not present

## 2016-12-13 DIAGNOSIS — M25512 Pain in left shoulder: Secondary | ICD-10-CM | POA: Insufficient documentation

## 2016-12-13 DIAGNOSIS — Z96641 Presence of right artificial hip joint: Secondary | ICD-10-CM | POA: Diagnosis not present

## 2016-12-13 DIAGNOSIS — G894 Chronic pain syndrome: Secondary | ICD-10-CM | POA: Insufficient documentation

## 2016-12-13 DIAGNOSIS — F1190 Opioid use, unspecified, uncomplicated: Secondary | ICD-10-CM | POA: Diagnosis not present

## 2016-12-13 DIAGNOSIS — M79601 Pain in right arm: Secondary | ICD-10-CM | POA: Insufficient documentation

## 2016-12-13 DIAGNOSIS — Z885 Allergy status to narcotic agent status: Secondary | ICD-10-CM | POA: Insufficient documentation

## 2016-12-13 DIAGNOSIS — M4802 Spinal stenosis, cervical region: Secondary | ICD-10-CM | POA: Insufficient documentation

## 2016-12-13 DIAGNOSIS — G8929 Other chronic pain: Secondary | ICD-10-CM

## 2016-12-13 DIAGNOSIS — M5116 Intervertebral disc disorders with radiculopathy, lumbar region: Secondary | ICD-10-CM | POA: Diagnosis not present

## 2016-12-13 DIAGNOSIS — Z880 Allergy status to penicillin: Secondary | ICD-10-CM | POA: Diagnosis not present

## 2016-12-13 DIAGNOSIS — F329 Major depressive disorder, single episode, unspecified: Secondary | ICD-10-CM | POA: Insufficient documentation

## 2016-12-13 DIAGNOSIS — K219 Gastro-esophageal reflux disease without esophagitis: Secondary | ICD-10-CM | POA: Diagnosis not present

## 2016-12-13 DIAGNOSIS — Z79891 Long term (current) use of opiate analgesic: Secondary | ICD-10-CM | POA: Diagnosis not present

## 2016-12-13 DIAGNOSIS — R42 Dizziness and giddiness: Secondary | ICD-10-CM | POA: Insufficient documentation

## 2016-12-13 DIAGNOSIS — R51 Headache: Secondary | ICD-10-CM | POA: Insufficient documentation

## 2016-12-13 DIAGNOSIS — M542 Cervicalgia: Secondary | ICD-10-CM | POA: Insufficient documentation

## 2016-12-13 DIAGNOSIS — E871 Hypo-osmolality and hyponatremia: Secondary | ICD-10-CM | POA: Insufficient documentation

## 2016-12-13 DIAGNOSIS — M25511 Pain in right shoulder: Secondary | ICD-10-CM | POA: Diagnosis not present

## 2016-12-13 DIAGNOSIS — Z981 Arthrodesis status: Secondary | ICD-10-CM | POA: Diagnosis not present

## 2016-12-13 DIAGNOSIS — Z87442 Personal history of urinary calculi: Secondary | ICD-10-CM | POA: Insufficient documentation

## 2016-12-13 DIAGNOSIS — M19011 Primary osteoarthritis, right shoulder: Secondary | ICD-10-CM | POA: Insufficient documentation

## 2016-12-13 MED ORDER — OXYCODONE HCL 5 MG PO TABS: 5.0000 mg | ORAL_TABLET | Freq: Four times a day (QID) | ORAL | 0 refills | Status: DC | PRN

## 2016-12-13 NOTE — Patient Instructions (Signed)
____________________________________________________________________________________________  Preparing for Procedure with Sedation Instructions: . Oral Intake: Do not eat or drink anything for at least 8 hours prior to your procedure. . Transportation: Public transportation is not allowed. Bring an adult driver. The driver must be physically present in our waiting room before any procedure can be started. Marland Kitchen Physical Assistance: Bring an adult physically capable of assisting you, in the event you need help. This adult should keep you company at home for at least 6 hours after the procedure. . Blood Pressure Medicine: Take your blood pressure medicine with a sip of water the morning of the procedure. . Blood thinners:  . Diabetics on insulin: Notify the staff so that you can be scheduled 1st case in the morning. If your diabetes requires high dose insulin, take only  of your normal insulin dose the morning of the procedure and notify the staff that you have done so. . Preventing infections: Shower with an antibacterial soap the morning of your procedure. . Build-up your immune system: Take 1000 mg of Vitamin C with every meal (3 times a day) the day prior to your procedure. Marland Kitchen Antibiotics: Inform the staff if you have a condition or reason that requires you to take antibiotics before dental procedures. . Pregnancy: If you are pregnant, call and cancel the procedure. . Sickness: If you have a cold, fever, or any active infections, call and cancel the procedure. . Arrival: You must be in the facility at least 30 minutes prior to your scheduled procedure. . Children: Do not bring children with you. . Dress appropriately: Bring dark clothing that you would not mind if they get stained. . Valuables: Do not bring any jewelry or valuables. Procedure appointments are reserved for interventional treatments only. Marland Kitchen No Prescription Refills. . No medication changes will be discussed during procedure  appointments. . No disability issues will be discussed. ____________________________________________________________________________________________   ____________________________________________________________________________________________  Medication Rules  Applies to: All patients receiving prescriptions (written or electronic).  Pharmacy of record: Pharmacy where electronic prescriptions will be sent. If written prescriptions are taken to a different pharmacy, please inform the nursing staff. The pharmacy listed in the electronic medical record should be the one where you would like electronic prescriptions to be sent.  Prescription refills: Only during scheduled appointments. Applies to both, written and electronic prescriptions.  NOTE: The following applies primarily to controlled substances (Opioid* Pain Medications).   Patient's responsibilities: 1. Pain Pills: Bring all pain pills to every appointment (except for procedure appointments). 2. Pill Bottles: Bring pills in original pharmacy bottle. Always bring newest bottle. Bring bottle, even if empty. 3. Medication refills: You are responsible for knowing and keeping track of what medications you need refilled. The day before your appointment, write a list of all prescriptions that need to be refilled. Bring that list to your appointment and give it to the admitting nurse. Prescriptions will be written only during appointments. If you forget a medication, it will not be "Called in", "Faxed", or "electronically sent". You will need to get another appointment to get these prescribed. 4. Prescription Accuracy: You are responsible for carefully inspecting your prescriptions before leaving our office. Have the discharge nurse carefully go over each prescription with you, before taking them home. Make sure that your name is accurately spelled, that your address is correct. Check the name and dose of your medication to make sure it is  accurate. Check the number of pills, and the written instructions to make sure they are clear and accurate.  Make sure that you are given enough medication to last until your next medication refill appointment. 5. Taking Medication: Take medication as prescribed. Never take more pills than instructed. Never take medication more frequently than prescribed. Taking less pills or less frequently is permitted and encouraged, when it comes to controlled substances (written prescriptions).  6. Inform other Doctors: Always inform, all of your healthcare providers, of all the medications you take. 7. Pain Medication from other Providers: You are not allowed to accept any additional pain medication from any other Doctor or Healthcare provider. There are two exceptions to this rule. (see below) In the event that you require additional pain medication, you are responsible for notifying us, as stated below. 8. Medication Agreement: You are responsible for carefully reading and following our Medication Agreement. This must be signed before receiving any prescriptions from our practice. Safely store a copy of your signed Agreement. Violations to the Agreement will result in no further prescriptions. (Additional copies of our Medication Agreement are available upon request.) 9. Laws, Rules, & Regulations: All patients are expected to follow all Federal and [employer], [employer], Rules, [employer]. Ignorance of the Laws does not constitute a valid excuse. The use of any illegal substances is prohibited. 10. Adopted CDC guidelines & recommendations: Target dosing levels will be at or below 60 MME/day. Use of benzodiazepines** is not recommended.  Exceptions: There are only two exceptions to the rule of not receiving pain medications from other Healthcare Providers. 1. Exception #1 (Emergencies): In the event of an emergency (i.e.: accident requiring emergency care), you are allowed to receive additional pain medication.  However, you are responsible for: As soon as you are able, call our office [phone], at any time of the day or night, and leave a message stating your name, the date and nature of the emergency, and the name and dose of the medication prescribed. In the event that your call is answered by a member of our staff, make sure to document and save the date, time, and the name of the person that took your information.  2. Exception #2 (Planned Surgery): In the event that you are scheduled by another doctor or dentist to have any type of surgery or procedure, you are allowed (for a period no longer than 30 days), to receive additional pain medication, for the acute post-op pain. However, in this case, you are responsible for picking up a copy of our "Post-op Pain Management for Surgeons" handout, and giving it to your surgeon or dentist. This document is available at our office, and does not require an appointment to obtain it. Simply go to our office during business hours (Monday-Thursday from 8:00 AM to 4:00 PM) (Friday 8:00 AM to 12:00 Noon) or if you have a scheduled appointment with [place], prior to your surgery, and ask for it by name. In addition, you will need to provide [place] with your name, name of your surgeon, type of surgery, and date of procedure or surgery.  *Opioid medications include: morphine, codeine, oxycodone, oxymorphone, hydrocodone, hydromorphone, meperidine, tramadol, tapentadol, buprenorphine, fentanyl, methadone. **Benzodiazepine medications include: diazepam (Valium), alprazolam (Xanax), clonazepam (Klonopine), lorazepam (Ativan), clorazepate (Tranxene), chlordiazepoxide (Librium), estazolam (Prosom), oxazepam (Serax), temazepam (Restoril), triazolam (Halcion)  ____________________________________________________________________________________________

## 2016-12-13 NOTE — Progress Notes (Signed)
Safety precautions to be maintained throughout the outpatient stay will include: orient to surroundings, keep bed in low position, maintain call bell within reach at all times, provide assistance with transfer out of bed and ambulation.  

## 2016-12-28 ENCOUNTER — Ambulatory Visit: Payer: Medicare Other | Admitting: Pain Medicine

## 2016-12-28 DIAGNOSIS — Z471 Aftercare following joint replacement surgery: Secondary | ICD-10-CM | POA: Diagnosis not present

## 2016-12-28 DIAGNOSIS — M1612 Unilateral primary osteoarthritis, left hip: Secondary | ICD-10-CM | POA: Diagnosis not present

## 2016-12-28 DIAGNOSIS — M1711 Unilateral primary osteoarthritis, right knee: Secondary | ICD-10-CM | POA: Diagnosis not present

## 2016-12-28 DIAGNOSIS — Z96641 Presence of right artificial hip joint: Secondary | ICD-10-CM | POA: Diagnosis not present

## 2017-01-04 ENCOUNTER — Ambulatory Visit (HOSPITAL_BASED_OUTPATIENT_CLINIC_OR_DEPARTMENT_OTHER): Payer: Medicare Other | Admitting: Pain Medicine

## 2017-01-04 ENCOUNTER — Encounter: Payer: Self-pay | Admitting: Pain Medicine

## 2017-01-04 ENCOUNTER — Ambulatory Visit
Admission: RE | Admit: 2017-01-04 | Discharge: 2017-01-04 | Disposition: A | Discharge status: Home / Self Care | Payer: Medicare Other | Source: Ambulatory Visit | Attending: Pain Medicine | Admitting: Pain Medicine

## 2017-01-04 VITALS — BP 132/74 | HR 55 | Temp 97.0°F | Resp 17 | Ht 64.0 in | Wt 155.0 lb

## 2017-01-04 DIAGNOSIS — M19011 Primary osteoarthritis, right shoulder: Secondary | ICD-10-CM | POA: Insufficient documentation

## 2017-01-04 DIAGNOSIS — Z96641 Presence of right artificial hip joint: Secondary | ICD-10-CM | POA: Insufficient documentation

## 2017-01-04 DIAGNOSIS — Z9889 Other specified postprocedural states: Secondary | ICD-10-CM | POA: Diagnosis not present

## 2017-01-04 DIAGNOSIS — Z880 Allergy status to penicillin: Secondary | ICD-10-CM | POA: Insufficient documentation

## 2017-01-04 DIAGNOSIS — M19012 Primary osteoarthritis, left shoulder: Secondary | ICD-10-CM | POA: Insufficient documentation

## 2017-01-04 DIAGNOSIS — M25552 Pain in left hip: Secondary | ICD-10-CM | POA: Insufficient documentation

## 2017-01-04 DIAGNOSIS — G8929 Other chronic pain: Secondary | ICD-10-CM

## 2017-01-04 DIAGNOSIS — M25511 Pain in right shoulder: Secondary | ICD-10-CM | POA: Diagnosis not present

## 2017-01-04 DIAGNOSIS — Z888 Allergy status to other drugs, medicaments and biological substances status: Secondary | ICD-10-CM | POA: Insufficient documentation

## 2017-01-04 DIAGNOSIS — G8918 Other acute postprocedural pain: Secondary | ICD-10-CM | POA: Diagnosis not present

## 2017-01-04 DIAGNOSIS — Z885 Allergy status to narcotic agent status: Secondary | ICD-10-CM | POA: Diagnosis not present

## 2017-01-04 DIAGNOSIS — M25512 Pain in left shoulder: Secondary | ICD-10-CM | POA: Diagnosis not present

## 2017-01-04 DIAGNOSIS — Z9049 Acquired absence of other specified parts of digestive tract: Secondary | ICD-10-CM | POA: Diagnosis not present

## 2017-01-04 HISTORY — DX: Other acute postprocedural pain: G89.18

## 2017-01-04 MED ORDER — LIDOCAINE HCL 2 % IJ SOLN
10.0000 mL | Freq: Once | INTRAMUSCULAR | Status: AC
  Administered 2017-01-04: 400 mg
  Filled 2017-01-04: qty 20

## 2017-01-04 MED ORDER — MIDAZOLAM HCL 5 MG/5ML IJ SOLN
1.0000 mg | INTRAMUSCULAR | Status: DC | PRN
  Administered 2017-01-04: 1 mg via INTRAVENOUS
  Filled 2017-01-04: qty 5

## 2017-01-04 MED ORDER — ROPIVACAINE HCL 2 MG/ML IJ SOLN
9.0000 mL | Freq: Once | INTRAMUSCULAR | Status: AC
  Administered 2017-01-04: 9 mL via PERINEURAL
  Filled 2017-01-04: qty 10

## 2017-01-04 MED ORDER — FENTANYL CITRATE (PF) 100 MCG/2ML IJ SOLN
25.0000 ug | INTRAMUSCULAR | Status: DC | PRN
Start: 2017-01-04 — End: 2017-01-04
  Administered 2017-01-04: 100 ug via INTRAVENOUS
  Filled 2017-01-04: qty 2

## 2017-01-04 MED ORDER — LACTATED RINGERS IV SOLN: 1000.0000 mL | Freq: Once | INTRAVENOUS | Status: DC

## 2017-01-04 MED ORDER — HYDROCODONE-ACETAMINOPHEN 5-325 MG PO TABS: 1.0000 | ORAL_TABLET | Freq: Three times a day (TID) | ORAL | 0 refills | Status: AC | PRN

## 2017-01-04 MED ORDER — TRIAMCINOLONE ACETONIDE 40 MG/ML IJ SUSP
40.0000 mg | Freq: Once | INTRAMUSCULAR | Status: AC
Start: 2017-01-04 — End: 2017-01-04
  Administered 2017-01-04: 40 mg
  Filled 2017-01-04: qty 1

## 2017-01-04 NOTE — Progress Notes (Signed)
Patient's Name: [name]  MRN: [id number]  Referring Provider: Jerrol Banana.,*  DOB: [birth year]  PCP: Virginia Crews, MD  DOS: 01/04/2017  Note by: Gaspar Cola, MD  Service setting: Ambulatory outpatient  Specialty: Interventional Pain Management  Patient type: Established  Location: [facility] (AMB) Pain Management Facility  Visit type: Interventional Procedure   Primary Reason for Visit: Interventional Pain Management Treatment. CC: Shoulder Pain (left) and Hip Pain (left)  Procedure:  Anesthesia, Analgesia, Anxiolysis:  Type: Therapeutic Suprascapular nerve Radiofrequency Ablation #1 Region: Posterior Shoulder & Scapular Areas Level: Superior to the scapular spine, in the lateral aspect of the supraspinatus fossa (Suprescapular notch). Laterality: Left-Side  Type: Local Anesthesia with Moderate (Conscious) Sedation Local Anesthetic: Lidocaine 1% Route: Intravenous (IV) IV Access: Secured Sedation: Meaningful verbal contact was maintained at all times during the procedure  Indication(s): Analgesia and Anxiety   Indications: 1. Chronic shoulder pain (Secondary source of pain) (Bilateral) (L>R)   2. 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 Chronic shoulder arthropathy (Left)   4. Acute postoperative pain    [name] has either failed to respond, was unable to tolerate, or simply did not get enough benefit from other more conservative therapies including, but not limited to: 1. Over-the-counter medications 2. Anti-inflammatory medications 3. Muscle relaxants 4. Membrane stabilizers 5. Opioids 6. Physical therapy 7. Modalities (Heat, ice, etc.) 8. Invasive techniques such as nerve blocks. [name] has attained more than 50% relief of the pain from a series of diagnostic injections conducted in separate occasions.  Pain Score: Pre-procedure: 2 /10 Post-procedure: 1 /10  Pre-op Assessment:  [name] is a 56 y.o. (year old), female patient,  seen today for interventional treatment. She  has a past surgical history that includes RIGHT HIP ARTHROSCOPY FOR LABRAL TEAR (ABOUT 2010); Anterior fusion cervical spine (MAY 2012); Nasal septum surgery (MARCH 2013); Back surgery (2009); Tonsillectomy; Abdominal hysterectomy; DIAGNOSTIC LAPAROSCOPIES - MULTIPLE FOR ENDOMETRIOSIS; Cholecystectomy; Hip arthroscopy (09/20/2011); Shoulder arthroscopy (05-06-12); Carpal tunnel release (05-06-12); Total hip arthroplasty (Right, 05/13/2012); Anterior cervical decomp/discectomy fusion (N/A, 10/30/2012); Posterior cervical fusion/foraminotomy (N/A, 04/16/2013); Breast biopsy (Right, 2008); Breast excisional biopsy (Left, 1998); and Breast reduction surgery (Bilateral, 06/2016). [name] has a current medication list which includes the following prescription(s): acetaminophen, albuterol, alprazolam, bupropion, escitalopram, estradiol, fluticasone, fluticasone-salmeterol, loratadine, metoprolol succinate, montelukast, omeprazole, ondansetron, oxycodone, oxycodone, oxycodone, aerochamber mv, valacyclovir, and hydrocodone-acetaminophen, and the following Facility-Administered Medications: fentanyl, lactated ringers, and midazolam. Her primarily concern today is the Shoulder Pain (left) and Hip Pain (left)  Initial Vital Signs: Blood pressure 131/70, pulse (!) 55, temperature (!) 97 F (36.1 C), resp. rate 12, height 5\' 4"  (1.626 m), weight 155 lb (70.3 kg), SpO2 100 %. BMI: Estimated body mass index is 26.61 kg/m as calculated from the following:   Height as of this encounter: 5\' 4"  (1.626 m).   Weight as of this encounter: 155 lb (70.3 kg).  Risk Assessment: Allergies: Reviewed. She is allergic to amoxicillin; chlorhexidine gluconate; clindamycin; codeine; erythromycin; penicillin g; sulfa antibiotics; shellfish allergy; decadron [dexamethasone]; mangifera indica; papaya derivatives; betadine [povidone iodine]; chlorhexidine; clarithromycin; clindamycin/lincomycin; other;  povidone-iodine; and prednisone.  Allergy Precautions: No radiological contrast used Coagulopathies: Reviewed. None identified.  Blood-thinner therapy: None at this time Active Infection(s): Reviewed. None identified. [name] is afebrile  Site Confirmation: [name] was asked to confirm the procedure and laterality before marking the site Procedure checklist: Completed Consent: Before the procedure and under the influence of no sedative(s), amnesic(s), or anxiolytics,  the patient was informed of the treatment options, risks and possible complications. To fulfill our ethical and legal obligations, as recommended by the American Medical Association's Code of Ethics, I have informed the patient of my clinical impression; the nature and purpose of the treatment or procedure; the risks, benefits, and possible complications of the intervention; the alternatives, including doing nothing; the risk(s) and benefit(s) of the alternative treatment(s) or procedure(s); and the risk(s) and benefit(s) of doing nothing. The patient was provided information about the general risks and possible complications associated with the procedure. These may include, but are not limited to: failure to achieve desired goals, infection, bleeding, organ or nerve damage, allergic reactions, paralysis, and death. In addition, the patient was informed of those risks and complications associated to the procedure, such as failure to decrease pain; infection; bleeding; organ or nerve damage with subsequent damage to sensory, motor, and/or autonomic systems, resulting in permanent pain, numbness, and/or weakness of one or several areas of the body; allergic reactions; (i.e.: anaphylactic reaction); and/or death. Furthermore, the patient was informed of those risks and complications associated with the medications. These include, but are not limited to: allergic reactions (i.e.: anaphylactic or anaphylactoid reaction(s)); adrenal axis  suppression; blood sugar elevation that in diabetics may result in ketoacidosis or comma; water retention that in patients with history of congestive heart failure may result in shortness of breath, pulmonary edema, and decompensation with resultant heart failure; weight gain; swelling or edema; medication-induced neural toxicity; particulate matter embolism and blood vessel occlusion with resultant organ, and/or nervous system infarction; and/or aseptic necrosis of one or more joints. Finally, the patient was informed that Medicine is not an exact science; therefore, there is also the possibility of unforeseen or unpredictable risks and/or possible complications that may result in a catastrophic outcome. The patient indicated having understood very clearly. We have given the patient no guarantees and we have made no promises. Enough time was given to the patient to ask questions, all of which were answered to the patient's satisfaction. Ms. Novicki has indicated that she wanted to continue with the procedure. Attestation: I, the ordering provider, attest that I have discussed with the patient the benefits, risks, side-effects, alternatives, likelihood of achieving goals, and potential problems during recovery for the procedure that I have provided informed consent. Date: 01/04/2017; Time: 12:29 PM  Pre-Procedure Preparation:  Monitoring: As per clinic protocol. Respiration, ETCO2, SpO2, BP, heart rate and rhythm monitor placed and checked for adequate function Safety Precautions: Patient was assessed for positional comfort and pressure points before starting the procedure. Time-out: I initiated and conducted the "Time-out" before starting the procedure, as per protocol. The patient was asked to participate by confirming the accuracy of the "Time Out" information. Verification of the correct person, site, and procedure were performed and confirmed by me, the nursing staff, and the patient. "Time-out"  conducted as per Joint Commission's Universal Protocol (UP.01.01.01). "Time-out" Date & Time: 01/04/2017; 1144 hrs.  Description of Procedure Process:   Position: Prone Target Area: Suprascapular nerve as it passes thru the lower portion of the suprascapular notch. Approach: Posterior approach. Area Prepped: Entire shoulder Area Prepping solution: Hibiclens (4.0% Chlorhexidine gluconate solution) Safety Precautions: Aspiration looking for blood return was conducted prior to all injections. At no point did we inject any substances, as a needle was being advanced. No attempts were made at seeking any paresthesias. Safe injection practices and needle disposal techniques used. Medications properly checked for expiration dates. SDV (single dose vial) medications used.  Description of the Procedure: Protocol guidelines were followed. The patient was placed in position over the procedure table. The target area was identified and the area prepped in the usual manner. The skin and muscle were infiltrated with local anesthetic. Appropriate amount of time allowed to pass for local anesthetics to take effect. Radiofrequency needles were introduced to the target area using fluoroscopic guidance. Using the NeuroTherm NT1100 Radiofrequency Generator, sensory stimulation using 50 Hz was used to locate & identify the nerve, making sure that the needle was positioned such that there was no sensory stimulation below 0.3 V or above 0.7 V. Stimulation using 2 Hz was used to evaluate the motor component. Care was taken not to lesion any nerves that demonstrated motor stimulation of the lower extremities at an output of less than 2.5 times that of the sensory threshold, or a maximum of 2.0 V. Once satisfactory placement of the needles was achieved, the numbing solution was slowly injected after negative aspiration. After waiting for at least 2 minutes, the ablation was performed at 80 degrees C for 60 seconds, using Bipolar  settings. Once the procedure was completed, the needles were then removed and the area cleansed, making sure to leave some of the prepping solution back to take advantage of its long term bactericidal properties. Intra-operative Compliance: Compliant Vitals:   01/04/17 1208 01/04/17 1217 01/04/17 1227 01/04/17 1236  BP: 132/77 137/87 131/70 132/74  Pulse: (!) 58 (!) 55 (!) 55 (!) 55  Resp: 12 13 12 17   Temp:      SpO2: 100% 100% 100% 99%  Weight:      Height:        Start Time: 1144 hrs. End Time: 1205 hrs.    Materials & Medications:  Needle(s) Type: Teflon-coated, curved tip, Radiofrequency needle(s) Gauge: 22G Length: 10cm Medication(s): We administered midazolam, fentaNYL, lidocaine, triamcinolone acetonide, and ropivacaine (PF) 2 mg/mL (0.2%). Please see chart orders for dosing details.  Imaging Guidance (Non-Spinal):  Type of Imaging Technique: Fluoroscopy Guidance (Non-Spinal) Indication(s): Assistance in needle guidance and placement for procedures requiring needle placement in or near specific anatomical locations not easily accessible without such assistance. Exposure Time: Please see nurses notes. Contrast: None used. Fluoroscopic Guidance: I was personally present during the use of fluoroscopy. "Tunnel Vision Technique" used to obtain the best possible view of the target area. Parallax error corrected before commencing the procedure. "Direction-depth-direction" technique used to introduce the needle under continuous pulsed fluoroscopy. Once target was reached, antero-posterior, oblique, and lateral fluoroscopic projection used confirm needle placement in all planes. Images permanently stored in EMR. Interpretation: No contrast injected.  Antibiotic Prophylaxis:  Indication(s): None identified Antibiotic given: None  Post-operative Assessment:  EBL: None Complications: No immediate post-treatment complications observed by team, or reported by patient. Note: The  patient tolerated the entire procedure well. A repeat set of vitals were taken after the procedure and the patient was kept under observation following institutional policy, for this type of procedure. Post-procedural neurological assessment was performed, showing return to baseline, prior to discharge. The patient was provided with post-procedure discharge instructions, including a section on how to identify potential problems. Should any problems arise concerning this procedure, the patient was given instructions to immediately contact us, at any time, without hesitation. In any case, we plan to contact the patient by telephone for a follow-up status report regarding this interventional procedure. Comments:  No additional relevant information.  Plan of Care   Imaging Orders     DG C-Arm 1-60 Min-No  Report  Procedure Orders     Radiofrequency Shoulder Joint     Radiofrequency Shoulder Joint  Medications ordered for procedure: Meds ordered this encounter  Medications  . lactated ringers infusion 1,000 mL  . midazolam (VERSED) 5 MG/5ML injection 1-2 mg    Make sure Flumazenil is available in the pyxis when using this medication. If oversedation occurs, administer 0.2 mg IV over 15 sec. If after 45 sec no response, administer 0.2 mg again over 1 min; may repeat at 1 min intervals; not to exceed 4 doses (1 mg)  . fentaNYL (SUBLIMAZE) injection 25-50 mcg    Make sure Narcan is available in the pyxis when using this medication. In the event of respiratory depression (RR< 8/min): Titrate NARCAN (naloxone) in increments of 0.1 to 0.2 mg IV at 2-3 minute intervals, until desired degree of reversal.  . lidocaine (XYLOCAINE) 2 % (with pres) injection 200 mg  . triamcinolone acetonide (KENALOG-40) injection 40 mg (Discarded. Not used)  . ropivacaine (PF) 2 mg/mL (0.2%) (NAROPIN) injection 9 mL  . HYDROcodone-acetaminophen (NORCO/VICODIN) 5-325 MG tablet    Sig: Take 1 tablet by mouth every 8 (eight)  hours as needed for severe pain.    Dispense:  21 tablet    Refill:  0    For acute post-operative pain. Not to be refilled. To last 7 days.   Medications administered: We administered midazolam, fentaNYL, lidocaine, and ropivacaine (PF) 2 mg/mL (0.2%).  See the medical record for exact dosing, route, and time of administration.  New Prescriptions   HYDROCODONE-ACETAMINOPHEN (NORCO/VICODIN) 5-325 MG TABLET    Take 1 tablet by mouth every 8 (eight) hours as needed for severe pain.   Disposition: Discharge home  Discharge Date & Time: 01/04/2017; 1240 hrs.   Physician-requested Follow-up: Return for contralateral RFA in about 2-weeks. Future Appointments Date Time Provider [place] [facility]  01/10/2017 9:30 AM Lucilla Lame, MD [facility] None  01/19/2017 4:00 PM Virginia Crews, MD [facility] None  02/28/2017 10:30 AM Vevelyn Francois, NP [facility] None  12/10/2017 9:30 AM [facility]-NURSE HEALTH ADVISOR [facility] None  12/10/2017 10:00 AM Bacigalupo, Dionne Bucy, MD [facility] None   Primary Care Physician: Virginia Crews, MD Location: [facility] Facility Note by: Gaspar Cola, MD Date: 01/04/2017; Time: 12:40 PM  Disclaimer:  Medicine is not an Chief Strategy Officer. The only guarantee in medicine is that nothing is guaranteed. It is important to note that the decision to proceed with this intervention was based on the information collected from the patient. The Data and conclusions were drawn from the patient's questionnaire, the interview, and the physical examination. Because the information was provided in large part by the patient, it cannot be guaranteed that it has not been purposely or unconsciously manipulated. Every effort has been made to obtain as much relevant data as possible for this evaluation. It is important to note that the conclusions that lead to this procedure are derived in large part from the available data. Always take into account that the treatment will  also be dependent on availability of resources and existing treatment guidelines, considered by other Pain Management Practitioners as being common knowledge and practice, at the time of the intervention. For Medico-Legal purposes, it is also important to point out that variation in procedural techniques and pharmacological choices are the acceptable norm. The indications, contraindications, technique, and results of the above procedure should only be interpreted and judged by a Board-Certified Interventional Pain Specialist with extensive familiarity and expertise in  the same exact procedure and technique.

## 2017-01-04 NOTE — Patient Instructions (Addendum)
____________________________________________________________________________________________  Post-Procedure instructions Instructions:  Apply ice: Fill a plastic sandwich bag with crushed ice. Cover it with a small towel and apply to injection site. Apply for 15 minutes then remove x 15 minutes. Repeat sequence on day of procedure, until you go to bed. The purpose is to minimize swelling and discomfort after procedure.  Apply heat: Apply heat to procedure site starting the day following the procedure. The purpose is to treat any soreness and discomfort from the procedure.  Food intake: Start with clear liquids (like water) and advance to regular food, as tolerated.   Physical activities: Keep activities to a minimum for the first 8 hours after the procedure.   Driving: If you have received any sedation, you are not allowed to drive for 24 hours after your procedure.  Blood thinner: Restart your blood thinner 6 hours after your procedure. (Only for those taking blood thinners)  Insulin: As soon as you can eat, you may resume your normal dosing schedule. (Only for those taking insulin)  Infection prevention: Keep procedure site clean and dry.  Post-procedure Pain Diary: Extremely important that this be done correctly and accurately. Recorded information will be used to determine the next step in treatment.  Pain evaluated is that of treated area only. Do not include pain from an untreated area.  Complete every hour, on the hour, for the initial 8 hours. Set an alarm to help you do this part accurately.  Do not go to sleep and have it completed later. It will not be accurate.  Follow-up appointment: Keep your follow-up appointment after the procedure. Usually 2 weeks for most procedures. (6 weeks in the case of radiofrequency.) Bring you pain diary.  Expect:  From numbing medicine (AKA: Local Anesthetics): Numbness or decrease in pain.  Onset: Full effect within 15 minutes of  injected.  Duration: It will depend on the type of local anesthetic used. On the average, 1 to 8 hours.   From steroids: Decrease in swelling or inflammation. Once inflammation is improved, relief of the pain will follow.  Onset of benefits: Depends on the amount of swelling present. The more swelling, the longer it will take for the benefits to be seen. In some cases, up to 10 days.  Duration: Steroids will stay in the system x 2 weeks. Duration of benefits will depend on multiple posibilities including persistent irritating factors.  From procedure: Some discomfort is to be expected once the numbing medicine wears off. This should be minimal if ice and heat are applied as instructed. Call if:  You experience numbness and weakness that gets worse with time, as opposed to wearing off.  New onset bowel or bladder incontinence. (Spinal procedures only)  Emergency Numbers:  Durning business hours (Monday - Thursday, 8:00 AM - 4:00 PM) (Friday, 9:00 AM - 12:00 Noon): [phone]  After hours: [phone] ____________________________________________________________________________________________  ____________________________________________________________________________________________  Preparing for Procedure with Sedation Instructions: . Oral Intake: Do not eat or drink anything for at least 8 hours prior to your procedure. . Transportation: Public transportation is not allowed. Bring an adult driver. The driver must be physically present in our waiting room before any procedure can be started. . Physical Assistance: Bring an adult physically capable of assisting you, in the event you need help. This adult should keep you company at home for at least 6 hours after the procedure. . Blood Pressure Medicine: Take your blood pressure medicine with a sip of water the morning of the procedure. . Blood   thinners:  . Diabetics on insulin: Notify the staff so that you can be scheduled  1st case in the morning. If your diabetes requires high dose insulin, take only  of your normal insulin dose the morning of the procedure and notify the staff that you have done so. . Preventing infections: Shower with an antibacterial soap the morning of your procedure. . Build-up your immune system: Take 1000 mg of Vitamin C with every meal (3 times a day) the day prior to your procedure. . Antibiotics: Inform the staff if you have a condition or reason that requires you to take antibiotics before dental procedures. . Pregnancy: If you are pregnant, call and cancel the procedure. . Sickness: If you have a cold, fever, or any active infections, call and cancel the procedure. . Arrival: You must be in the facility at least 30 minutes prior to your scheduled procedure. . Children: Do not bring children with you. . Dress appropriately: Bring dark clothing that you would not mind if they get stained. . Valuables: Do not bring any jewelry or valuables. Procedure appointments are reserved for interventional treatments only. . No Prescription Refills. . No medication changes will be discussed during procedure appointments. . No disability issues will be discussed. ____________________________________________________________________________________________   

## 2017-01-04 NOTE — Progress Notes (Signed)
Safety precautions to be maintained throughout the outpatient stay will include: orient to surroundings, keep bed in low position, maintain call bell within reach at all times, provide assistance with transfer out of bed and ambulation.  

## 2017-01-05 ENCOUNTER — Telehealth: Payer: Self-pay

## 2017-01-05 ENCOUNTER — Other Ambulatory Visit: Payer: Self-pay | Admitting: Orthopedic Surgery

## 2017-01-05 DIAGNOSIS — M1612 Unilateral primary osteoarthritis, left hip: Secondary | ICD-10-CM

## 2017-01-05 NOTE — Telephone Encounter (Signed)
Post procedure phone call. Patient states she is doing good.  

## 2017-01-10 ENCOUNTER — Other Ambulatory Visit: Payer: Self-pay

## 2017-01-10 ENCOUNTER — Encounter: Payer: Self-pay | Admitting: Gastroenterology

## 2017-01-10 ENCOUNTER — Ambulatory Visit (INDEPENDENT_AMBULATORY_CARE_PROVIDER_SITE_OTHER): Payer: Medicare Other | Admitting: Gastroenterology

## 2017-01-10 DIAGNOSIS — R1312 Dysphagia, oropharyngeal phase: Secondary | ICD-10-CM

## 2017-01-10 NOTE — Progress Notes (Signed)
Gastroenterology Consultation  Referring Provider:     Virginia Crews, MD Primary Care Physician:  Virginia Crews, MD Primary Gastroenterologist:  Dr. Allen Norris     Reason for Consultation:     Dysphagia        HPI:   [name] is a 56 y.o. y/o female referred for consultation & management of dysphagia by Dr. Brita Romp, Dionne Bucy, MD.  This patient comes in today with a history of dysphagia. The patient states her dysphagia started after she had neck surgery with metal but in her neck. The patient has not had any report of any unexplained weight loss. The patient also denies any black stools or bloody stools. The patient states her dysphagia to solids and not liquids. She also has proms swallowing pills. The patient has not had a colonoscopy in the last 12 years. She also reports that at that time she had an EGD and colonoscopy which were both reported to be normal. There is no abdominal pain associated with her dysphagia. She states that she does not eat alone because she is scared that the food will get stuck and she will choke on it. She feels the food getting stuck in her upper esophagus/throat. She also reports that she's had multiple surgeries including hip replacement and states she needs another hip replacement. The patient reports that she has been taking Prilosec twice a day with continued heartburn.  Past Medical History:  Diagnosis Date  . Abnormal EKG    HX OF INVERTED T WAVES ON EKG, PALPITATIONS, CHEST PAINS-CARDIAC WORK UP DID NOT SHOW ANY HEART DISEASE  . 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 Anemia    Iron Infusion-8 yrs ago  . Anxiety   . Asthma    INHALERS WITH FLARE -UPS  . Cephalalgia 08/18/2014  . Cervical disc disease 08/18/2014   Needs neck surgery.   . Chronic headaches   . DDD (degenerative disc disease)    CERVICAL AND LUMBAR-CHRONIC PAIN, RT HIP LABRAL TEAR  . Depression    PT STATES A LOT OF STRESS IN HER LIFE  . Dizziness 04/22/2013  . Duodenal ulcer  with hemorrhage and perforation ([place]) 04/27/2003  . GERD (gastroesophageal reflux disease)   . H/O arthrodesis 08/18/2014  . Headache(784.0)    AND NECK PAIN--STATES RECENT TEST SHOW CERVICAL DEGENERATION  . History of blood transfusion    s/p back surgery  . History of cervical spinal surgery 01/04/2015  . History of kidney stones   . Hypertension   . Inverted T wave   . Narrowing of intervertebral disc space 08/18/2014   Currently on disability.   . Orthostatic hypotension 04/22/2013  . Pain    CHRONIC NECK AND BACK PAIN - LIMITED ROM NECK - S/P FUSIONS CERVICAL AND LUMBAR  . Pneumonia   . PONV (postoperative nausea and vomiting)    PT GIVES HX OF N&V AND FEVER WITH SURGERIES YEARS AGO--BUT NO PROBLEMS WITH MORE RECENT SURGERIES--STATES NOT MALIGNANT HYPERTHERMIA  . Postop Hyponatremia 05/14/2012  . Postoperative anemia due to acute blood loss 05/14/2012  . Right foot drop   . 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     Past Surgical History:  Procedure Laterality Date  . ABDOMINAL HYSTERECTOMY    . ANTERIOR CERVICAL DECOMP/DISCECTOMY FUSION N/A 10/30/2012   Procedure: ACDF C5-6, EXPLORATION AND HARDWARE REMOVAL C6-7;  Surgeon: Melina Schools, MD;  Location: [facility];  Service: Orthopedics;  Laterality: N/A;  .  ANTERIOR FUSION CERVICAL SPINE  MAY 2012   AT [facility]  . BACK SURGERY  2009   LUMBAR FUSION WITH RODS   . BREAST BIOPSY Right 2008   benign.- bx/clip  . BREAST EXCISIONAL BIOPSY Left 1998   benign  . BREAST REDUCTION SURGERY Bilateral 06/2016  . CARPAL TUNNEL RELEASE  05-06-12   Right  . CHOLECYSTECTOMY    . DIAGNOSTIC LAPAROSCOPIES - MULTIPLE FOR ENDOMETRIOSIS    . HIP ARTHROSCOPY  09/20/2011   Procedure: ARTHROSCOPY HIP;  Surgeon: Gearlean Alf, MD;  Location: [facility];  Service: Orthopedics;  Laterality: Right;  Right Hip Scope with Labral Debridement  . NASAL SEPTUM SURGERY  MARCH 2013   IN [place]  . POSTERIOR CERVICAL  FUSION/FORAMINOTOMY N/A 04/16/2013   Procedure: REMOVAL CERVICAL PLATES AND INTERBODY CAGE/POSTERIOR CERVICAL SPINAL FUSION C4 - C6/C5 CORPECTOMY/C4 - C6 FUSION WITH ILIAC CREST BONE GRAFT;  Surgeon: Melina Schools, MD;  Location: [facility];  Service: Orthopedics;  Laterality: N/A;  . RIGHT HIP ARTHROSCOPY FOR LABRAL TEAR  ABOUT 2010   2012 also  . SHOULDER ARTHROSCOPY  05-06-12   bone spur  . TONSILLECTOMY     AS A CHILD  . TOTAL HIP ARTHROPLASTY Right 05/13/2012   Procedure: TOTAL HIP ARTHROPLASTY ANTERIOR APPROACH;  Surgeon: Gearlean Alf, MD;  Location: [facility];  Service: Orthopedics;  Laterality: Right;    Prior to Admission medications   Medication Sig Start Date End Date Taking? Authorizing Provider  acetaminophen (TYLENOL) 325 MG tablet Take by mouth.    [provider]  albuterol (PROVENTIL HFA) 108 (90 Base) MCG/ACT inhaler Inhale into the lungs.    [provider]  ALPRAZolam Duanne Moron) 1 MG tablet TAKE 1/2 TAB BY MOUTH EVERY MORNING, 1/2 TAB AT LUNCH, AND 2 TABS AT BEDTIME 09/25/16   Mar Daring, PA-C  buPROPion (WELLBUTRIN XL) 300 MG 24 hr tablet Take 1 tablet (300 mg total) by mouth daily. 12/07/16   Virginia Crews, MD  escitalopram (LEXAPRO) 20 MG tablet Take 1 tablet (20 mg total) by mouth at bedtime. Patient taking differently: Take 40 mg by mouth at bedtime.  12/07/16   Virginia Crews, MD  estradiol (ESTRACE VAGINAL) 0.1 MG/GM vaginal cream Place vaginally. 01/28/16   [provider]  fluticasone (FLONASE) 50 MCG/ACT nasal spray Place 2 sprays into both nostrils daily. Patient taking differently: Place 2 sprays into both nostrils daily as needed.  05/26/16   Chrismon, Vickki Muff, PA  Fluticasone-Salmeterol (ADVAIR DISKUS) 500-50 MCG/DOSE AEPB Inhale 1 puff into the lungs 2 (two) times daily. Patient taking differently: Inhale 1 puff into the lungs 2 (two) times daily. I puff in the morning and 1 puff at night 09/15/14   Mungal, Vishal, MD    HYDROcodone-acetaminophen (NORCO/VICODIN) 5-325 MG tablet Take 1 tablet by mouth every 8 (eight) hours as needed for severe pain. 01/04/17 01/11/17  Milinda Pointer, MD  loratadine (CLARITIN) 10 MG tablet TAKE 1 TABLET (10 MG TOTAL) BY MOUTH DAILY. 12/14/16   Virginia Crews, MD  metoprolol succinate (TOPROL-XL) 50 MG 24 hr tablet TAKE 1 TABLET BY MOUTH EVERY DAY 09/07/16   Mar Daring, PA-C  montelukast (SINGULAIR) 10 MG tablet TAKE 1 TABLET (10 MG TOTAL) BY MOUTH DAILY. 11/23/16   Mar Daring, PA-C  omeprazole (PRILOSEC) 20 MG capsule TAKE ONE CAPSULE BY MOUTH TWICE A DAY 08/10/16   Mar Daring, PA-C  ondansetron (ZOFRAN) 4 MG tablet Take 1 tablet (4 mg total) by  mouth every 8 (eight) hours as needed for nausea or vomiting. 04/06/15   Nance Pear, MD  oxyCODONE (OXY IR/ROXICODONE) 5 MG immediate release tablet Take 1 tablet (5 mg total) by mouth every 6 (six) hours as needed for severe pain. 12/13/16 01/12/17  Milinda Pointer, MD  oxyCODONE (OXY IR/ROXICODONE) 5 MG immediate release tablet Take 1 tablet (5 mg total) by mouth every 6 (six) hours as needed for severe pain. 01/12/17 02/11/17  Milinda Pointer, MD  oxyCODONE (OXY IR/ROXICODONE) 5 MG immediate release tablet Take 1 tablet (5 mg total) by mouth every 6 (six) hours as needed for severe pain. 02/11/17 03/13/17  Milinda Pointer, MD  Spacer/Aero-Holding Chambers (AEROCHAMBER MV) inhaler by Does not apply route. 06/24/14   [provider]  valACYclovir (VALTREX) 1000 MG tablet TAKE TWO TABLETS BY MOUTH TWICE DAILY FOR ONE DAY FEVER BLISTER 03/03/15   Margarita Rana, MD    Family History  Problem Relation Age of Onset  . Aneurysm Mother   . Aneurysm Maternal Grandmother   . Breast cancer Paternal Grandmother 71     Social History  Substance Use Topics  . Smoking status: Never Smoker  . Smokeless tobacco: Never Used  . Alcohol use No    Allergies as of 01/10/2017 - Review Complete  01/04/2017  Allergen Reaction Noted  . Amoxicillin Hives 08/20/2014  . Chlorhexidine gluconate Dermatitis and Hives 07/06/2016  . Clindamycin Hives 08/20/2014  . Codeine Hives 08/21/2011  . Erythromycin Hives 07/06/2016  . Penicillin g Hives 07/06/2016  . Sulfa antibiotics Nausea And Vomiting and Hives 09/06/2011  . Shellfish allergy Hives 10/08/2012  . Decadron [dexamethasone] Other (See Comments) 09/20/2011  . Mangifera indica Hives 07/20/2016  . Papaya derivatives Hives 10/30/2012  . Betadine [povidone iodine] Hives 09/06/2011  . Chlorhexidine Hives 09/06/2011  . Clarithromycin Hives 09/08/2011  . Clindamycin/lincomycin Hives 04/30/2012  . Other Hives 09/06/2011  . Povidone-iodine Hives 09/06/2011  . Prednisone Anxiety 08/18/2014    Review of Systems:    All systems reviewed and negative except where noted in HPI.   Physical Exam:  There were no vitals taken for this visit. No LMP recorded. Patient has had a hysterectomy. Psych:  Alert and cooperative. Normal mood and affect. General:   Alert,  Well-developed, well-nourished, pleasant and cooperative in NAD Head:  Normocephalic and atraumatic. Eyes:  Sclera clear, no icterus.   Conjunctiva pink. Ears:  Normal auditory acuity. Nose:  No deformity, discharge, or lesions. Mouth:  No deformity or lesions,oropharynx pink & moist. Neck:  Supple; no masses or thyromegaly. Lungs:  Respirations even and unlabored.  Clear throughout to auscultation.   No wheezes, crackles, or rhonchi. No acute distress. Heart:  Regular rate and rhythm; no murmurs, clicks, rubs, or gallops. Abdomen:  Normal bowel sounds.  No bruits.  Soft, non-tender and non-distended without masses, hepatosplenomegaly or hernias noted.  No guarding or rebound tenderness.  Negative Carnett sign.   Rectal:  Deferred.  Msk:  Symmetrical without gross deformities.  Good, equal movement & strength bilaterally. Pulses:  Normal pulses noted. Extremities:  No clubbing or  edema.  No cyanosis. Neurologic:  Alert and oriented x3;  grossly normal neurologically. Skin:  Intact without significant lesions or rashes.  No jaundice. Lymph Nodes:  No significant cervical adenopathy. Psych:  Alert and cooperative. Normal mood and affect.  Imaging Studies: Dg C-arm 1-60 Min-no Report  Result Date: 01/04/2017 Fluoroscopy was utilized by the requesting physician.  No radiographic interpretation.    Assessment and Plan:  SHALAYAH BEAGLEY is a 56 y.o. y/o female with dysphagia after having surgery of her neck. The patient has dysphagia to solids more than liquids. The patient also reports that she has not had a colonoscopy in many years. The patient has had chronic heartburn and states her Prilosec 20 mg twice a day is not helping her. The patient will be started on a trial of Dexilant that has been given samples. The patient will also be set up for an EGD and colonoscopy to rule out any should ensure narrowing of the esophagus causing her problems or whether it may be an extrinsic compression causing her dysphagia. I have discussed risks & benefits which include, but are not limited to, bleeding, infection, perforation & drug reaction.  The patient agrees with this plan & written consent will be obtained.    Lucilla Lame, MD. [facility] Regal   Note: This dictation was prepared with Dragon dictation along with smaller phrase technology. Any transcriptional errors that result from this process are unintentional.

## 2017-01-11 ENCOUNTER — Encounter: Payer: Self-pay | Admitting: *Deleted

## 2017-01-11 ENCOUNTER — Ambulatory Visit
Admission: RE | Admit: 2017-01-11 | Discharge: 2017-01-11 | Disposition: A | Discharge status: Home / Self Care | Payer: Medicare Other | Source: Ambulatory Visit | Attending: Orthopedic Surgery | Admitting: Orthopedic Surgery

## 2017-01-11 ENCOUNTER — Other Ambulatory Visit: Payer: Self-pay

## 2017-01-11 DIAGNOSIS — M1612 Unilateral primary osteoarthritis, left hip: Secondary | ICD-10-CM | POA: Insufficient documentation

## 2017-01-11 DIAGNOSIS — Z1211 Encounter for screening for malignant neoplasm of colon: Secondary | ICD-10-CM

## 2017-01-11 DIAGNOSIS — Z96641 Presence of right artificial hip joint: Secondary | ICD-10-CM | POA: Diagnosis not present

## 2017-01-11 DIAGNOSIS — M25552 Pain in left hip: Secondary | ICD-10-CM | POA: Diagnosis not present

## 2017-01-18 ENCOUNTER — Ambulatory Visit: Payer: Medicare Other | Admitting: Pain Medicine

## 2017-01-19 ENCOUNTER — Other Ambulatory Visit: Payer: Self-pay | Admitting: Physician Assistant

## 2017-01-19 ENCOUNTER — Ambulatory Visit: Payer: Self-pay | Admitting: Family Medicine

## 2017-01-19 DIAGNOSIS — F3342 Major depressive disorder, recurrent, in full remission: Secondary | ICD-10-CM

## 2017-01-19 NOTE — Telephone Encounter (Signed)
Pharmacy requesting refills. Thanks!  

## 2017-01-19 NOTE — Telephone Encounter (Signed)
This was switched to Wellbutrin XL 300mg  daily at last visit.  She should no longer be taking SR version.  Can you check with pharmacy that she is getting the correct medicaiton.  If needed, we can send in refills of XL version.  Virginia Crews, MD, MPH [facility] 01/19/2017 11:12 AM

## 2017-01-19 NOTE — Telephone Encounter (Signed)
Pharmacy reports that this was an error. They are aware of the medication change and the patient is aware also.

## 2017-01-23 NOTE — Discharge Instructions (Signed)
General Anesthesia, Adult, Care After °These instructions provide you with information about caring for yourself after your procedure. Your health care provider may also give you more specific instructions. Your treatment has been planned according to current medical practices, but problems sometimes occur. Call your health care provider if you have any problems or questions after your procedure. °What can I expect after the procedure? °After the procedure, it is common to have: °· Vomiting. °· A sore throat. °· Mental slowness. ° °It is common to feel: °· Nauseous. °· Cold or shivery. °· Sleepy. °· Tired. °· Sore or achy, even in parts of your body where you did not have surgery. ° °Follow these instructions at home: °For at least 24 hours after the procedure: °· Do not: °? Participate in activities where you could fall or become injured. °? Drive. °? Use heavy machinery. °? Drink alcohol. °? Take sleeping pills or medicines that cause drowsiness. °? Make important decisions or sign legal documents. °? Take care of children on your own. °· Rest. °Eating and drinking °· If you vomit, drink water, juice, or soup when you can drink without vomiting. °· Drink enough fluid to keep your urine clear or pale yellow. °· Make sure you have little or no nausea before eating solid foods. °· Follow the diet recommended by your health care provider. °General instructions °· Have a responsible adult stay with you until you are awake and alert. °· Return to your normal activities as told by your health care provider. Ask your health care provider what activities are safe for you. °· Take over-the-counter and prescription medicines only as told by your health care provider. °· If you smoke, do not smoke without supervision. °· Keep all follow-up visits as told by your health care provider. This is important. °Contact a health care provider if: °· You continue to have nausea or vomiting at home, and medicines are not helpful. °· You  cannot drink fluids or start eating again. °· You cannot urinate after 8-12 hours. °· You develop a skin rash. °· You have fever. °· You have increasing redness at the site of your procedure. °Get help right away if: °· You have difficulty breathing. °· You have chest pain. °· You have unexpected bleeding. °· You feel that you are having a life-threatening or urgent problem. °This information is not intended to replace advice given to you by your health care provider. Make sure you discuss any questions you have with your health care provider. °Document Released: 06/05/2000 Document Revised: 08/02/2015 Document Reviewed: 02/11/2015 °Elsevier Interactive Patient Education © 2018 [employer] ° °

## 2017-01-26 ENCOUNTER — Ambulatory Visit: Payer: Medicare Other | Admitting: Anesthesiology

## 2017-01-26 ENCOUNTER — Ambulatory Visit
Admission: RE | Admit: 2017-01-26 | Discharge: 2017-01-26 | Disposition: A | Discharge status: Home / Self Care | Payer: Medicare Other | Source: Ambulatory Visit | Attending: Gastroenterology | Admitting: Gastroenterology

## 2017-01-26 ENCOUNTER — Encounter
Admission: RE | Disposition: A | Discharge status: Home / Self Care | Payer: Self-pay | Source: Ambulatory Visit | Attending: Gastroenterology

## 2017-01-26 DIAGNOSIS — Z881 Allergy status to other antibiotic agents status: Secondary | ICD-10-CM | POA: Insufficient documentation

## 2017-01-26 DIAGNOSIS — G8929 Other chronic pain: Secondary | ICD-10-CM | POA: Diagnosis not present

## 2017-01-26 DIAGNOSIS — Z79899 Other long term (current) drug therapy: Secondary | ICD-10-CM | POA: Diagnosis not present

## 2017-01-26 DIAGNOSIS — R1310 Dysphagia, unspecified: Secondary | ICD-10-CM | POA: Diagnosis not present

## 2017-01-26 DIAGNOSIS — Z803 Family history of malignant neoplasm of breast: Secondary | ICD-10-CM | POA: Insufficient documentation

## 2017-01-26 DIAGNOSIS — Z91013 Allergy to seafood: Secondary | ICD-10-CM | POA: Insufficient documentation

## 2017-01-26 DIAGNOSIS — M21371 Foot drop, right foot: Secondary | ICD-10-CM | POA: Diagnosis not present

## 2017-01-26 DIAGNOSIS — Z90710 Acquired absence of both cervix and uterus: Secondary | ICD-10-CM | POA: Insufficient documentation

## 2017-01-26 DIAGNOSIS — Z880 Allergy status to penicillin: Secondary | ICD-10-CM | POA: Diagnosis not present

## 2017-01-26 DIAGNOSIS — F329 Major depressive disorder, single episode, unspecified: Secondary | ICD-10-CM | POA: Diagnosis not present

## 2017-01-26 DIAGNOSIS — D510 Vitamin B12 deficiency anemia due to intrinsic factor deficiency: Secondary | ICD-10-CM | POA: Insufficient documentation

## 2017-01-26 DIAGNOSIS — K640 First degree hemorrhoids: Secondary | ICD-10-CM | POA: Diagnosis not present

## 2017-01-26 DIAGNOSIS — F419 Anxiety disorder, unspecified: Secondary | ICD-10-CM | POA: Diagnosis not present

## 2017-01-26 DIAGNOSIS — K635 Polyp of colon: Secondary | ICD-10-CM

## 2017-01-26 DIAGNOSIS — K219 Gastro-esophageal reflux disease without esophagitis: Secondary | ICD-10-CM | POA: Diagnosis not present

## 2017-01-26 DIAGNOSIS — J45909 Unspecified asthma, uncomplicated: Secondary | ICD-10-CM | POA: Diagnosis not present

## 2017-01-26 DIAGNOSIS — M542 Cervicalgia: Secondary | ICD-10-CM | POA: Diagnosis not present

## 2017-01-26 DIAGNOSIS — Z9049 Acquired absence of other specified parts of digestive tract: Secondary | ICD-10-CM | POA: Diagnosis not present

## 2017-01-26 DIAGNOSIS — Z888 Allergy status to other drugs, medicaments and biological substances status: Secondary | ICD-10-CM | POA: Insufficient documentation

## 2017-01-26 DIAGNOSIS — D125 Benign neoplasm of sigmoid colon: Secondary | ICD-10-CM | POA: Diagnosis not present

## 2017-01-26 DIAGNOSIS — Z87442 Personal history of urinary calculi: Secondary | ICD-10-CM | POA: Insufficient documentation

## 2017-01-26 DIAGNOSIS — I10 Essential (primary) hypertension: Secondary | ICD-10-CM | POA: Diagnosis not present

## 2017-01-26 DIAGNOSIS — Z96641 Presence of right artificial hip joint: Secondary | ICD-10-CM | POA: Diagnosis not present

## 2017-01-26 DIAGNOSIS — K229 Disease of esophagus, unspecified: Secondary | ICD-10-CM | POA: Diagnosis not present

## 2017-01-26 DIAGNOSIS — Z91018 Allergy to other foods: Secondary | ICD-10-CM | POA: Insufficient documentation

## 2017-01-26 DIAGNOSIS — Z981 Arthrodesis status: Secondary | ICD-10-CM | POA: Diagnosis not present

## 2017-01-26 DIAGNOSIS — Z885 Allergy status to narcotic agent status: Secondary | ICD-10-CM | POA: Insufficient documentation

## 2017-01-26 DIAGNOSIS — Z1211 Encounter for screening for malignant neoplasm of colon: Secondary | ICD-10-CM | POA: Diagnosis not present

## 2017-01-26 DIAGNOSIS — G4730 Sleep apnea, unspecified: Secondary | ICD-10-CM | POA: Insufficient documentation

## 2017-01-26 DIAGNOSIS — Z8249 Family history of ischemic heart disease and other diseases of the circulatory system: Secondary | ICD-10-CM | POA: Insufficient documentation

## 2017-01-26 HISTORY — PX: COLONOSCOPY WITH PROPOFOL: SHX5780

## 2017-01-26 HISTORY — PX: POLYPECTOMY: SHX5525

## 2017-01-26 HISTORY — PX: ESOPHAGOGASTRODUODENOSCOPY (EGD) WITH PROPOFOL: SHX5813

## 2017-01-26 HISTORY — PX: ESOPHAGEAL DILATION: SHX303

## 2017-01-26 SURGERY — COLONOSCOPY WITH PROPOFOL
Anesthesia: General | Site: Rectum | Wound class: Contaminated

## 2017-01-26 MED ORDER — LIDOCAINE HCL (CARDIAC) 20 MG/ML IV SOLN
INTRAVENOUS | Status: DC | PRN
  Administered 2017-01-26: 50 mg via INTRAVENOUS

## 2017-01-26 MED ORDER — PANTOPRAZOLE SODIUM 40 MG PO TBEC: 40.0000 mg | DELAYED_RELEASE_TABLET | Freq: Every day | ORAL | 11 refills | Status: DC

## 2017-01-26 MED ORDER — PROPOFOL 10 MG/ML IV BOLUS
INTRAVENOUS | Status: DC | PRN
  Administered 2017-01-26: 100 mg via INTRAVENOUS
  Administered 2017-01-26 (×2): 60 mg via INTRAVENOUS
  Administered 2017-01-26 (×2): 50 mg via INTRAVENOUS

## 2017-01-26 MED ORDER — ACETAMINOPHEN 160 MG/5ML PO SOLN: 325.0000 mg | ORAL | Status: DC | PRN

## 2017-01-26 MED ORDER — STERILE WATER FOR IRRIGATION IR SOLN
Status: DC | PRN
  Administered 2017-01-26: 10:00:00

## 2017-01-26 MED ORDER — GLYCOPYRROLATE 0.2 MG/ML IJ SOLN
INTRAMUSCULAR | Status: DC | PRN
  Administered 2017-01-26: 0.1 mg via INTRAVENOUS

## 2017-01-26 MED ORDER — ACETAMINOPHEN 325 MG PO TABS: 325.0000 mg | ORAL_TABLET | ORAL | Status: DC | PRN

## 2017-01-26 MED ORDER — LACTATED RINGERS IV SOLN
INTRAVENOUS | Status: DC | PRN
  Administered 2017-01-26: 09:00:00 via INTRAVENOUS

## 2017-01-26 SURGICAL SUPPLY — 35 items
BALLN DILATOR 10-12 8 (BALLOONS)
BALLN DILATOR 12-15 8 (BALLOONS)
BALLN DILATOR 15-18 8 (BALLOONS)
BALLN DILATOR CRE 0-12 8 (BALLOONS)
BALLN DILATOR ESOPH 8 10 CRE (MISCELLANEOUS) IMPLANT
BALLOON DILATOR 12-15 8 (BALLOONS) IMPLANT
BALLOON DILATOR 15-18 8 (BALLOONS) IMPLANT
BALLOON DILATOR CRE 0-12 8 (BALLOONS) IMPLANT
BLOCK BITE 60FR ADLT L/F GRN (MISCELLANEOUS) ×4 IMPLANT
CANISTER SUCT 1200ML W/VALVE (MISCELLANEOUS) ×4 IMPLANT
CLIP HMST 235XBRD CATH ROT (MISCELLANEOUS) IMPLANT
CLIP RESOLUTION 360 11X235 (MISCELLANEOUS)
FCP ESCP3.2XJMB 240X2.8X (MISCELLANEOUS)
FORCEPS BIOP RAD 4 LRG CAP 4 (CUTTING FORCEPS) ×1 IMPLANT
FORCEPS BIOP RJ4 240 W/NDL (MISCELLANEOUS)
FORCEPS ESCP3.2XJMB 240X2.8X (MISCELLANEOUS) IMPLANT
GOWN CVR UNV OPN BCK APRN NK (MISCELLANEOUS) ×6 IMPLANT
GOWN ISOL THUMB LOOP REG UNIV (MISCELLANEOUS) ×8
INJECTOR VARIJECT VIN23 (MISCELLANEOUS) IMPLANT
KIT DEFENDO VALVE AND CONN (KITS) IMPLANT
KIT ENDO PROCEDURE OLY (KITS) ×4 IMPLANT
MARKER SPOT ENDO TATTOO 5ML (MISCELLANEOUS) IMPLANT
PAD GROUND ADULT SPLIT (MISCELLANEOUS) IMPLANT
PROBE APC STR FIRE (PROBE) IMPLANT
RETRIEVER NET PLAT FOOD (MISCELLANEOUS) IMPLANT
RETRIEVER NET ROTH 2.5X230 LF (MISCELLANEOUS) IMPLANT
SNARE SHORT THROW 13M SML OVAL (MISCELLANEOUS) IMPLANT
SNARE SHORT THROW 30M LRG OVAL (MISCELLANEOUS) IMPLANT
SNARE SNG USE RND 15MM (INSTRUMENTS) IMPLANT
SPOT EX ENDOSCOPIC TATTOO (MISCELLANEOUS)
SYR INFLATION 60ML (SYRINGE) IMPLANT
TRAP ETRAP POLY (MISCELLANEOUS) IMPLANT
VARIJECT INJECTOR VIN23 (MISCELLANEOUS)
WATER STERILE IRR 250ML POUR (IV SOLUTION) ×4 IMPLANT
WIRE CRE 18-20MM 8CM F G (MISCELLANEOUS) IMPLANT

## 2017-01-26 NOTE — Transfer of Care (Signed)
Immediate Anesthesia Transfer of Care Note  Patient: [name]  Procedure(s) Performed: COLONOSCOPY WITH PROPOFOL (N/A Rectum) ESOPHAGOGASTRODUODENOSCOPY (EGD) WITH PROPOFOL (N/A ) ESOPHAGEAL DILATION (N/A Esophagus) POLYPECTOMY (N/A Rectum)  Patient Location: PACU  Anesthesia Type: General  Level of Consciousness: awake, alert  and patient cooperative  Airway and Oxygen Therapy: Patient Spontanous Breathing and Patient connected to supplemental oxygen  Post-op Assessment: Post-op Vital signs reviewed, Patient's Cardiovascular Status Stable, Respiratory Function Stable, Patent Airway and No signs of Nausea or vomiting  Post-op Vital Signs: Reviewed and stable  Complications: No apparent anesthesia complications

## 2017-01-26 NOTE — Op Note (Signed)
[facility] Gastroenterology Patient Name: [name] Procedure Date: 01/26/2017 9:15 AM MRN: [id number] Account #: [id number] Date of Birth: Jul 27, 1960 Admit Type: Outpatient Age: 56 Room: [facility] OR ROOM 01 Gender: Female Note Status: Finalized Procedure:            Colonoscopy Indications:          Screening for colorectal malignant neoplasm Providers:            Lucilla Lame MD, MD Medicines:            Propofol per Anesthesia Complications:        No immediate complications. Procedure:            Pre-Anesthesia Assessment:                       - Prior to the procedure, a History and Physical was                        performed, and patient medications and allergies were                        reviewed. The patient's tolerance of previous                        anesthesia was also reviewed. The risks and benefits of                        the procedure and the sedation options and risks were                        discussed with the patient. All questions were                        answered, and informed consent was obtained. Prior                        Anticoagulants: The patient has taken no previous                        anticoagulant or antiplatelet agents. ASA Grade                        Assessment: II - A patient with mild systemic disease.                        After reviewing the risks and benefits, the patient was                        deemed in satisfactory condition to undergo the                        procedure.                       After obtaining informed consent, the colonoscope was                        passed under direct vision. Throughout the procedure,                        the patient's blood pressure,  pulse, and oxygen                        saturations were monitored continuously. The Olympus                        Colonoscope [id number]) was introduced through the                        anus and advanced to the the  cecum, identified by                        appendiceal orifice and ileocecal valve. The                        colonoscopy was performed without difficulty. The                        patient tolerated the procedure well. The quality of                        the bowel preparation was excellent. Findings:      The perianal and digital rectal examinations were normal.      A 3 mm polyp was found in the sigmoid colon. The polyp was sessile. The       polyp was removed with a cold biopsy forceps. Resection and retrieval       were complete.      Non-bleeding internal hemorrhoids were found during retroflexion. The       hemorrhoids were Grade I (internal hemorrhoids that do not prolapse). Impression:           - One 3 mm polyp in the sigmoid colon, removed with a                        cold biopsy forceps. Resected and retrieved.                       - Non-bleeding internal hemorrhoids. Recommendation:       - Discharge patient to home.                       - Resume previous diet.                       - Continue present medications. Procedure Code(s):    --- Professional ---                       [phone], Colonoscopy, flexible; with biopsy, single or                        multiple Diagnosis Code(s):    --- Professional ---                       Z12.11, Encounter for screening for malignant neoplasm                        of colon                       D12.5, Benign neoplasm of sigmoid colon CPT copyright 2016 American Medical Association. All  rights reserved. The codes documented in this report are preliminary and upon coder review may  be revised to meet current compliance requirements. Lucilla Lame MD, MD 01/26/2017 9:49:46 AM This report has been signed electronically. Number of Addenda: 0 Note Initiated On: 01/26/2017 9:15 AM Scope Withdrawal Time: 0 hours 8 minutes 7 seconds  Total Procedure Duration: 0 hours 13 minutes 57 seconds       [facility]

## 2017-01-26 NOTE — Anesthesia Procedure Notes (Signed)
Date/Time: 01/26/2017 9:17 AM Performed by: Cameron Ali, CRNA Pre-anesthesia Checklist: Patient identified, Emergency Drugs available, Suction available, Timeout performed and Patient being monitored Patient Re-evaluated:Patient Re-evaluated prior to induction Oxygen Delivery Method: Nasal cannula Placement Confirmation: positive ETCO2

## 2017-01-26 NOTE — Op Note (Signed)
[facility] Gastroenterology Patient Name: [name] Procedure Date: 01/26/2017 9:12 AM MRN: [id number] Account #: [id number] Date of Birth: April 26, 1960 Admit Type: Outpatient Age: 56 Room: [facility] OR ROOM 01 Gender: Female Note Status: Finalized Procedure:            Upper GI endoscopy Indications:          Dysphagia Providers:            Lucilla Lame MD, MD Referring MD:         Dionne Bucy. Bacigalupo (Referring MD) Medicines:            Propofol per Anesthesia Complications:        No immediate complications. Procedure:            Pre-Anesthesia Assessment:                       - Prior to the procedure, a History and Physical was                        performed, and patient medications and allergies were                        reviewed. The patient's tolerance of previous                        anesthesia was also reviewed. The risks and benefits of                        the procedure and the sedation options and risks were                        discussed with the patient. All questions were                        answered, and informed consent was obtained. Prior                        Anticoagulants: The patient has taken no previous                        anticoagulant or antiplatelet agents. ASA Grade                        Assessment: II - A patient with mild systemic disease.                        After reviewing the risks and benefits, the patient was                        deemed in satisfactory condition to undergo the                        procedure.                       After obtaining informed consent, the endoscope was                        passed under direct vision. Throughout the procedure,  the patient's blood pressure, pulse, and oxygen                        saturations were monitored continuously. The Olympus                        GIF H180J Endoscope (S#: [id number]) was introduced                        through  the mouth, and advanced to the second part of                        duodenum. The upper GI endoscopy was accomplished                        without difficulty. The patient tolerated the procedure                        well. Findings:      The examined esophagus was normal. Two biopsies were obtained in the       middle third of the esophagus with cold forceps for histology. The scope       was withdrawn. Dilation was performed with a Maloney dilator with no       resistance at 52 Fr. The dilation site was examined following endoscope       reinsertion and showed complete resolution of luminal narrowing.      The stomach was normal.      The examined duodenum was normal. Impression:           - Normal esophagus. Dilated.                       - Normal stomach.                       - Normal examined duodenum.                       - Two biopsies were obtained in the middle third of the                        esophagus. Recommendation:       - Discharge patient to home.                       - Resume previous diet.                       - Continue present medications. Procedure Code(s):    --- Professional ---                       [phone], Esophagogastroduodenoscopy, flexible, transoral;                        with biopsy, single or multiple                       [id number], Dilation of esophagus, by unguided sound or                        bougie, single or multiple passes Diagnosis Code(s):    --- Professional ---  R13.10, Dysphagia, unspecified CPT copyright 2016 American Medical Association. All rights reserved. The codes documented in this report are preliminary and upon coder review may  be revised to meet current compliance requirements. Lucilla Lame MD, MD 01/26/2017 9:31:46 AM This report has been signed electronically. Number of Addenda: 0 Note Initiated On: 01/26/2017 9:12 AM      [facility]

## 2017-01-26 NOTE — Anesthesia Postprocedure Evaluation (Signed)
Anesthesia Post Note  Patient: [name]  Procedure(s) Performed: COLONOSCOPY WITH PROPOFOL (N/A Rectum) ESOPHAGOGASTRODUODENOSCOPY (EGD) WITH PROPOFOL (N/A ) ESOPHAGEAL DILATION (N/A Esophagus) POLYPECTOMY (N/A Rectum)  Patient location during evaluation: PACU Anesthesia Type: General Level of consciousness: awake and alert Pain management: pain level controlled Vital Signs Assessment: post-procedure vital signs reviewed and stable Respiratory status: spontaneous breathing, nonlabored ventilation, respiratory function stable and patient connected to nasal cannula oxygen Cardiovascular status: blood pressure returned to baseline and stable Postop Assessment: no apparent nausea or vomiting Anesthetic complications: no    Trecia Rogers

## 2017-01-26 NOTE — Anesthesia Preprocedure Evaluation (Signed)
Anesthesia Evaluation  Patient identified by MRN, date of birth, ID band Patient awake    Reviewed: Allergy & Precautions, H&P , NPO status , Patient's Chart, lab work & pertinent test results, reviewed documented beta blocker date and time   History of Anesthesia Complications (+) PONV and history of anesthetic complications  Airway Mallampati: II  TM Distance: >3 FB Neck ROM: full    Dental no notable dental hx.    Pulmonary asthma , sleep apnea ,    Pulmonary exam normal breath sounds clear to auscultation       Cardiovascular Exercise Tolerance: Good hypertension, + DOE   Rhythm:regular Rate:Normal     Neuro/Psych  Headaches, PSYCHIATRIC DISORDERS Multiple spine surgeries    GI/Hepatic Neg liver ROS, GERD  Poorly Controlled,  Endo/Other  negative endocrine ROS  Renal/GU negative Renal ROS  negative genitourinary   Musculoskeletal   Abdominal   Peds  Hematology negative hematology ROS (+)   Anesthesia Other Findings   Reproductive/Obstetrics negative OB ROS                             Anesthesia Physical Anesthesia Plan  ASA: III  Anesthesia Plan: General   Post-op Pain Management:    Induction:   PONV Risk Score and Plan:   Airway Management Planned:   Additional Equipment:   Intra-op Plan:   Post-operative Plan:   Informed Consent: I have reviewed the patients History and Physical, chart, labs and discussed the procedure including the risks, benefits and alternatives for the proposed anesthesia with the patient or authorized representative who has indicated his/her understanding and acceptance.   Dental Advisory Given  Plan Discussed with: CRNA and Anesthesiologist  Anesthesia Plan Comments:         Anesthesia Quick Evaluation

## 2017-01-26 NOTE — H&P (Signed)
Shelby Lame, MD [address] [place], [place] [address] [phone] Fax : [phone]  Primary Care Physician:  Virginia Crews, MD Primary Gastroenterologist:  Dr. Allen Norris  Pre-Procedure History & Physical: HPI:  [name] is a 56 y.o. female is here for an endoscopy and colonoscopy.   Past Medical History:  Diagnosis Date  . Abnormal EKG    HX OF INVERTED T WAVES ON EKG, PALPITATIONS, CHEST PAINS-CARDIAC WORK UP DID NOT SHOW ANY HEART DISEASE  . 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 Anemia    Iron Infusion-8 yrs ago  . Anxiety   . Asthma    INHALERS WITH FLARE -UPS  . Cephalalgia 08/18/2014  . Cervical disc disease 08/18/2014   Needs neck surgery.   . Chronic headaches   . DDD (degenerative disc disease)    CERVICAL AND LUMBAR-CHRONIC PAIN, RT HIP LABRAL TEAR  . Depression    PT STATES A LOT OF STRESS IN HER LIFE  . Dizziness 04/22/2013  . Duodenal ulcer with hemorrhage and perforation ([place]) 04/27/2003  . GERD (gastroesophageal reflux disease)   . H/O arthrodesis 08/18/2014  . Headache(784.0)    AND NECK PAIN--STATES RECENT TEST SHOW CERVICAL DEGENERATION  . History of blood transfusion    s/p back surgery  . History of cervical spinal surgery 01/04/2015  . History of kidney stones   . Hypertension   . Inverted T wave   . Narrowing of intervertebral disc space 08/18/2014   Currently on disability.   . Orthostatic hypotension 04/22/2013  . Pain    CHRONIC NECK AND BACK PAIN - LIMITED ROM NECK - S/P FUSIONS CERVICAL AND LUMBAR  . Pneumonia   . PONV (postoperative nausea and vomiting)    PT GIVES HX OF N&V AND FEVER WITH SURGERIES YEARS AGO--BUT NO PROBLEMS WITH MORE RECENT SURGERIES--STATES NOT MALIGNANT HYPERTHERMIA  . Postop Hyponatremia 05/14/2012  . Postoperative anemia due to acute blood loss 05/14/2012  . Right foot drop   . 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     Past Surgical History:  Procedure  Laterality Date  . ABDOMINAL HYSTERECTOMY    . ANTERIOR CERVICAL DECOMP/DISCECTOMY FUSION N/A 10/30/2012   Procedure: ACDF C5-6, EXPLORATION AND HARDWARE REMOVAL C6-7;  Surgeon: Melina Schools, MD;  Location: [place];  Service: Orthopedics;  Laterality: N/A;  . ANTERIOR FUSION CERVICAL SPINE  MAY 2012   AT [facility]  . BACK SURGERY  2009   LUMBAR FUSION WITH RODS   . BREAST BIOPSY Right 2008   benign.- bx/clip  . BREAST EXCISIONAL BIOPSY Left 1998   benign  . BREAST REDUCTION SURGERY Bilateral 06/2016  . CARPAL TUNNEL RELEASE  05-06-12   Right  . CHOLECYSTECTOMY    . DIAGNOSTIC LAPAROSCOPIES - MULTIPLE FOR ENDOMETRIOSIS    . HIP ARTHROSCOPY  09/20/2011   Procedure: ARTHROSCOPY HIP;  Surgeon: Gearlean Alf, MD;  Location: [facility];  Service: Orthopedics;  Laterality: Right;  Right Hip Scope with Labral Debridement  . NASAL SEPTUM SURGERY  MARCH 2013   IN [place]  . POSTERIOR CERVICAL FUSION/FORAMINOTOMY N/A 04/16/2013   Procedure: REMOVAL CERVICAL PLATES AND INTERBODY CAGE/POSTERIOR CERVICAL SPINAL FUSION C4 - C6/C5 CORPECTOMY/C4 - C6 FUSION WITH ILIAC CREST BONE GRAFT;  Surgeon: Melina Schools, MD;  Location: [place];  Service: Orthopedics;  Laterality: N/A;  . RIGHT HIP ARTHROSCOPY FOR LABRAL TEAR  ABOUT 2010   2012 also  . SHOULDER ARTHROSCOPY  05-06-12  bone spur  . TONSILLECTOMY     AS A CHILD  . TOTAL HIP ARTHROPLASTY Right 05/13/2012   Procedure: TOTAL HIP ARTHROPLASTY ANTERIOR APPROACH;  Surgeon: Gearlean Alf, MD;  Location: [facility];  Service: Orthopedics;  Laterality: Right;    Prior to Admission medications   Medication Sig Start Date End Date Taking? Authorizing Provider  acetaminophen (TYLENOL) 325 MG tablet Take by mouth.   Yes [provider]  albuterol (PROVENTIL HFA) 108 (90 Base) MCG/ACT inhaler Inhale into the lungs.   Yes [provider]  ALPRAZolam (XANAX) 1 MG tablet TAKE 1/2 TAB BY MOUTH EVERY MORNING, 1/2 TAB AT LUNCH, AND 2 TABS AT BEDTIME 09/25/16  Yes  Fenton Malling M, PA-C  buPROPion (WELLBUTRIN XL) 300 MG 24 hr tablet Take 1 tablet (300 mg total) by mouth daily. 12/07/16  Yes Bacigalupo, Dionne Bucy, MD  Docusate Sodium (COLACE PO) Take by mouth 2 (two) times daily.   Yes [provider]  escitalopram (LEXAPRO) 20 MG tablet Take 1 tablet (20 mg total) by mouth at bedtime. Patient taking differently: Take 40 mg by mouth at bedtime.  12/07/16  Yes Bacigalupo, Dionne Bucy, MD  loratadine (CLARITIN) 10 MG tablet TAKE 1 TABLET (10 MG TOTAL) BY MOUTH DAILY. 12/14/16  Yes Bacigalupo, Dionne Bucy, MD  metoprolol succinate (TOPROL-XL) 50 MG 24 hr tablet TAKE 1 TABLET BY MOUTH EVERY DAY 09/07/16  Yes Fenton Malling M, PA-C  montelukast (SINGULAIR) 10 MG tablet TAKE 1 TABLET (10 MG TOTAL) BY MOUTH DAILY. 11/23/16  Yes Fenton Malling M, PA-C  omeprazole (PRILOSEC) 20 MG capsule TAKE ONE CAPSULE BY MOUTH TWICE A DAY 08/10/16  Yes Mar Daring, PA-C  ondansetron (ZOFRAN) 4 MG tablet Take 1 tablet (4 mg total) by mouth every 8 (eight) hours as needed for nausea or vomiting. 04/06/15  Yes Nance Pear, MD  oxyCODONE (OXY IR/ROXICODONE) 5 MG immediate release tablet Take 1 tablet (5 mg total) by mouth every 6 (six) hours as needed for severe pain. 12/13/16 01/12/17 Yes Milinda Pointer, MD  oxyCODONE (OXY IR/ROXICODONE) 5 MG immediate release tablet Take 1 tablet (5 mg total) by mouth every 6 (six) hours as needed for severe pain. 01/12/17 02/11/17 Yes Milinda Pointer, MD  oxyCODONE (OXY IR/ROXICODONE) 5 MG immediate release tablet Take 1 tablet (5 mg total) by mouth every 6 (six) hours as needed for severe pain. 02/11/17 03/13/17 Yes Milinda Pointer, MD  valACYclovir (VALTREX) 1000 MG tablet TAKE TWO TABLETS BY MOUTH TWICE DAILY FOR ONE DAY FEVER BLISTER 03/03/15  Yes Margarita Rana, MD  estradiol (ESTRACE VAGINAL) 0.1 MG/GM vaginal cream Place vaginally. 01/28/16   [provider]  fluticasone (FLONASE) 50 MCG/ACT nasal spray Place 2  sprays into both nostrils daily. Patient not taking: Reported on 01/11/2017 05/26/16   Chrismon, Vickki Muff, PA  Fluticasone-Salmeterol (ADVAIR DISKUS) 500-50 MCG/DOSE AEPB Inhale 1 puff into the lungs 2 (two) times daily. Patient not taking: Reported on 01/11/2017 09/15/14   Vilinda Boehringer, MD  Melatonin 10 MG TABS Take 20 mg by mouth at bedtime as needed.    [provider]  Spacer/Aero-Holding Chambers (AEROCHAMBER MV) inhaler by Does not apply route. 06/24/14   [provider]    Allergies as of 01/11/2017 - Review Complete 01/11/2017  Allergen Reaction Noted  . Amoxicillin Hives 08/20/2014  . Chlorhexidine gluconate Dermatitis and Hives 07/06/2016  . Clindamycin Hives 08/20/2014  . Codeine Hives 08/21/2011  . Erythromycin Hives 07/06/2016  . Penicillin g Hives 07/06/2016  .  Sulfa antibiotics Nausea And Vomiting and Hives 09/06/2011  . Shellfish allergy Hives 10/08/2012  . Decadron [dexamethasone] Other (See Comments) 09/20/2011  . Mangifera indica Hives 07/20/2016  . Papaya derivatives Hives 10/30/2012  . Betadine [povidone iodine] Hives 09/06/2011  . Chlorhexidine Hives 09/06/2011  . Clarithromycin Hives 09/08/2011  . Clindamycin/lincomycin Hives 04/30/2012  . Other Hives 09/06/2011  . Povidone-iodine Hives 09/06/2011  . Prednisone Anxiety 08/18/2014    Family History  Problem Relation Age of Onset  . Aneurysm Mother   . Aneurysm Maternal Grandmother   . Breast cancer Paternal Grandmother 22    Social History   Socioeconomic History  . Marital status: Married    Spouse name: Not on file  . Number of children: 2  . Years of education: Not on file  . Highest education level: Not on file  Social Needs  . Financial resource strain: Not on file  . Food insecurity - worry: Not on file  . Food insecurity - inability: Not on file  . Transportation needs - medical: Not on file  . Transportation needs - non-medical: Not on file  Occupational History  . Not on  file  Tobacco Use  . Smoking status: Never Smoker  . Smokeless tobacco: Never Used  Substance and Sexual Activity  . Alcohol use: No    Alcohol/week: 0.0 oz  . Drug use: No  . Sexual activity: Yes  Other Topics Concern  . Not on file  Social History Narrative  . Not on file    Review of Systems: See HPI, otherwise negative ROS  Physical Exam: BP 117/74   Pulse (!) 54   Temp 97.8 F (36.6 C)   Ht 5\' 4"  (1.626 m)   Wt 154 lb (69.9 kg)   SpO2 100%   BMI 26.43 kg/m  General:   Alert,  pleasant and cooperative in NAD Head:  Normocephalic and atraumatic. Neck:  Supple; no masses or thyromegaly. Lungs:  Clear throughout to auscultation.    Heart:  Regular rate and rhythm. Abdomen:  Soft, nontender and nondistended. Normal bowel sounds, without guarding, and without rebound.   Neurologic:  Alert and  oriented x4;  grossly normal neurologically.  Impression/Plan: BETHENE HANKINSON is here for an endoscopy and colonoscopy to be performed for dysphagia and screening.  Risks, benefits, limitations, and alternatives regarding  endoscopy and colonoscopy have been reviewed with the patient.  Questions have been answered.  All parties agreeable.   Shelby Lame, MD  01/26/2017, 8:33 AM

## 2017-01-30 ENCOUNTER — Encounter: Payer: Self-pay | Admitting: Gastroenterology

## 2017-01-31 ENCOUNTER — Encounter: Payer: Self-pay | Admitting: Gastroenterology

## 2017-02-05 ENCOUNTER — Telehealth: Payer: Self-pay | Admitting: Family Medicine

## 2017-02-05 NOTE — Telephone Encounter (Signed)
[facility] faxed a request for a 90-days supply for the following medication. Thanks CC  buPROPion (WELLBUTRIN XL) 300 MG 24 hr tablet

## 2017-02-05 NOTE — Telephone Encounter (Signed)
LOV 12/07/2016. No showed for FU on 01/19/2017.

## 2017-02-06 NOTE — Telephone Encounter (Signed)
[facility] [place] faxed another refill request for a 90 day supply of buPROPion (WELLBUTRIN XL) 300 MG 24 hr tablet. Please advise. Thanks TNP

## 2017-02-07 ENCOUNTER — Other Ambulatory Visit: Payer: Self-pay | Admitting: Family Medicine

## 2017-02-07 NOTE — Telephone Encounter (Signed)
This was approved earlier today and sent to pharmacy.  Please have patient reschedule f/u  Brita Romp Dionne Bucy, MD, MPH [facility] 02/07/2017 1:21 PM

## 2017-02-07 NOTE — Telephone Encounter (Signed)
Will refill, but can we call and get her rescheduled for f/u.  Virginia Crews, MD, MPH [facility] 02/07/2017 10:21 AM

## 2017-02-07 NOTE — Telephone Encounter (Signed)
Tried calling number in chart several times; automated message states that the "phone call can not be completed at this time". Will try again later.

## 2017-02-07 NOTE — Telephone Encounter (Signed)
No showed for depression FU OV.

## 2017-02-08 DIAGNOSIS — M1612 Unilateral primary osteoarthritis, left hip: Secondary | ICD-10-CM | POA: Diagnosis not present

## 2017-02-08 NOTE — Telephone Encounter (Signed)
Tried calling pt again, same automated message stating call can not be completed. Will try again later.

## 2017-02-12 ENCOUNTER — Telehealth: Payer: Self-pay | Admitting: Pain Medicine

## 2017-02-12 ENCOUNTER — Telehealth: Payer: Self-pay | Admitting: Gastroenterology

## 2017-02-12 NOTE — Telephone Encounter (Signed)
Please call with results

## 2017-02-12 NOTE — Telephone Encounter (Signed)
While chopping vegetables on Thanksgiving, felt a sudden "cramp" in left shoulder. Has a lump on the shoulder. Offered appointment with Corky Downs, patient agrees.

## 2017-02-12 NOTE — Telephone Encounter (Signed)
Having increased pain would like to speak with nurse please call asap

## 2017-02-13 ENCOUNTER — Telehealth: Payer: Self-pay

## 2017-02-13 ENCOUNTER — Ambulatory Visit: Payer: Medicare Other | Attending: Nurse Practitioner | Admitting: Nurse Practitioner

## 2017-02-13 ENCOUNTER — Encounter: Payer: Self-pay | Admitting: Nurse Practitioner

## 2017-02-13 ENCOUNTER — Other Ambulatory Visit: Payer: Self-pay | Admitting: Nurse Practitioner

## 2017-02-13 VITALS — BP 145/89 | HR 63 | Temp 98.3°F | Resp 16 | Ht 64.0 in | Wt 155.0 lb

## 2017-02-13 DIAGNOSIS — Z79899 Other long term (current) drug therapy: Secondary | ICD-10-CM | POA: Diagnosis not present

## 2017-02-13 DIAGNOSIS — M21371 Foot drop, right foot: Secondary | ICD-10-CM | POA: Diagnosis not present

## 2017-02-13 DIAGNOSIS — K589 Irritable bowel syndrome without diarrhea: Secondary | ICD-10-CM | POA: Diagnosis not present

## 2017-02-13 DIAGNOSIS — F329 Major depressive disorder, single episode, unspecified: Secondary | ICD-10-CM | POA: Insufficient documentation

## 2017-02-13 DIAGNOSIS — R42 Dizziness and giddiness: Secondary | ICD-10-CM | POA: Insufficient documentation

## 2017-02-13 DIAGNOSIS — Z96641 Presence of right artificial hip joint: Secondary | ICD-10-CM | POA: Diagnosis not present

## 2017-02-13 DIAGNOSIS — G8918 Other acute postprocedural pain: Secondary | ICD-10-CM | POA: Diagnosis not present

## 2017-02-13 DIAGNOSIS — I10 Essential (primary) hypertension: Secondary | ICD-10-CM | POA: Diagnosis not present

## 2017-02-13 DIAGNOSIS — M25511 Pain in right shoulder: Secondary | ICD-10-CM

## 2017-02-13 DIAGNOSIS — Z9889 Other specified postprocedural states: Secondary | ICD-10-CM | POA: Insufficient documentation

## 2017-02-13 DIAGNOSIS — G894 Chronic pain syndrome: Secondary | ICD-10-CM

## 2017-02-13 DIAGNOSIS — R1310 Dysphagia, unspecified: Secondary | ICD-10-CM | POA: Diagnosis not present

## 2017-02-13 DIAGNOSIS — Z87442 Personal history of urinary calculi: Secondary | ICD-10-CM | POA: Insufficient documentation

## 2017-02-13 DIAGNOSIS — Z79891 Long term (current) use of opiate analgesic: Secondary | ICD-10-CM | POA: Insufficient documentation

## 2017-02-13 DIAGNOSIS — J45909 Unspecified asthma, uncomplicated: Secondary | ICD-10-CM | POA: Insufficient documentation

## 2017-02-13 DIAGNOSIS — M19011 Primary osteoarthritis, right shoulder: Secondary | ICD-10-CM

## 2017-02-13 DIAGNOSIS — Z1211 Encounter for screening for malignant neoplasm of colon: Secondary | ICD-10-CM | POA: Insufficient documentation

## 2017-02-13 DIAGNOSIS — M1610 Unilateral primary osteoarthritis, unspecified hip: Secondary | ICD-10-CM | POA: Diagnosis not present

## 2017-02-13 DIAGNOSIS — E871 Hypo-osmolality and hyponatremia: Secondary | ICD-10-CM | POA: Diagnosis not present

## 2017-02-13 DIAGNOSIS — D125 Benign neoplasm of sigmoid colon: Secondary | ICD-10-CM | POA: Insufficient documentation

## 2017-02-13 DIAGNOSIS — R51 Headache: Secondary | ICD-10-CM | POA: Insufficient documentation

## 2017-02-13 DIAGNOSIS — M47812 Spondylosis without myelopathy or radiculopathy, cervical region: Secondary | ICD-10-CM | POA: Diagnosis not present

## 2017-02-13 DIAGNOSIS — M79601 Pain in right arm: Secondary | ICD-10-CM | POA: Diagnosis not present

## 2017-02-13 DIAGNOSIS — M25512 Pain in left shoulder: Secondary | ICD-10-CM | POA: Diagnosis not present

## 2017-02-13 DIAGNOSIS — K219 Gastro-esophageal reflux disease without esophagitis: Secondary | ICD-10-CM | POA: Diagnosis not present

## 2017-02-13 DIAGNOSIS — G8929 Other chronic pain: Secondary | ICD-10-CM

## 2017-02-13 DIAGNOSIS — M19012 Primary osteoarthritis, left shoulder: Secondary | ICD-10-CM | POA: Diagnosis not present

## 2017-02-13 DIAGNOSIS — M5412 Radiculopathy, cervical region: Secondary | ICD-10-CM

## 2017-02-13 MED ORDER — DIAZEPAM 5 MG PO TABS: 5.0000 mg | ORAL_TABLET | ORAL | 0 refills | Status: DC | PRN

## 2017-02-13 MED ORDER — CYCLOBENZAPRINE HCL 5 MG PO TABS: 5.0000 mg | ORAL_TABLET | Freq: Three times a day (TID) | ORAL | 0 refills | Status: DC | PRN

## 2017-02-13 NOTE — Telephone Encounter (Signed)
Scheduled FU at pt's earliest convenience, which is January 3rd.

## 2017-02-13 NOTE — Telephone Encounter (Signed)
Patient is scheduled for an MRI on 02/21/17 and needs valium called out to [facility]. She is claustrophobic and needs it for the MRI.

## 2017-02-13 NOTE — Telephone Encounter (Signed)
Printed can be called

## 2017-02-13 NOTE — Progress Notes (Signed)
Patient's Name: [name]  MRN: [id number]  Referring Provider: Virginia Crews, MD  DOB: 1960-09-14  PCP: Shelby Crews, MD  DOS: 02/13/2017  Note by: Vevelyn Francois NP  Service setting: Ambulatory outpatient  Specialty: Interventional Pain Management  Location: [facility] (AMB) Pain Management Facility    Patient type: Established    Primary Reason(s) for Visit: Evaluation of chronic illnesses with exacerbation, or progression (Level of risk: moderate) CC: Shoulder Pain (left)  HPI  [name] is a 56 y.o. year old, female patient, who comes today for a follow-up evaluation. She has Chronic neck pain; Anxiety state; Asthma, mild; Blurred vision; Benign paroxysmal positional nystagmus; Congenital renal agenesis and dysgenesis; Clinical depression; Colon, diverticulosis; Fatigue; Cannot sleep; IBS (irritable bowel syndrome); Awareness of heartbeats; RAD (reactive airway disease); Apnea, sleep; Esophagitis, reflux; DOE (dyspnea on exertion); Chronic hip pain (Right); Encounter for therapeutic drug level monitoring; Long term current use of opiate analgesic; Long term prescription opiate use; Opiate use (30 MME/Day); Chronic pain syndrome; Steroid intolerance; Cervical spondylosis; Cervical facet arthropathy (Bilateral); History of cervical spinal surgery; Chronic shoulder pain (Secondary source of pain) (Bilateral) (L>R); 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 [name] was last seen on 01/04/17. Her primarily concern today is the Shoulder Pain (left)  Pain Assessment: Location: Left Shoulder Radiating: down the arm and into the neck and down scapula Onset: More than a month ago Duration: Chronic pain Quality: Sore(most horrible pain she has ever had.  feels as if something is being ripped in there, crushing pain) Severity: 4 /10 (self-reported pain score)  Note: Reported level is compatible with observation.                         When using our objective Pain Scale, levels between 6 and 10/10 are said to belong in an emergency room, as it progressively worsens from a 6/10, described as severely limiting, requiring emergency care not usually available at an outpatient pain management facility. At a 6/10 level, communication becomes difficult and requires great effort. Assistance to reach the emergency department may be required. Facial flushing and profuse sweating along with potentially dangerous increases in heart rate and blood pressure will be evident. Effect on ADL: limited ROM Timing: Constant Modifying factors: massage, heat  Further details on both, my assessment(s), as well as the proposed treatment plan, please see below. She states that on Thanksgiving Day she was washing dishes. She lifted a pot and felt like she got a "Charlie horse in her left shoulder. She screamed and her husband came and massaged her shoulder. She admits that the was a large knot that formed. The area got smaller but has not resolved completely. She admits that she is having weakness. She is having pain that starts at base of  her neck into her shoulder and down into her elbow. She denies any numbness or tingling. She admits that it is very tender to the touchShe is SP Left suprascapular RFA on 01/04/17. She admits that she has been doing well with this procedure. She admits that she had a history of a small tear. She was later told by [facility] Orthopedist that the tear had repaired itself. She has been treated with interventional therapy. She admits that she may have been overdoing things. 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 She admits that she is to have to have left hip surgery in the next few weeks. She does not want anything that will "thin her blood". She does not feel like the current Oxycodone 5 mg is effective for the treatment of this new pain. She does not have a muscle relaxer. .    Laboratory Chemistry  Inflammation Markers (CRP: Acute Phase) (ESR: Chronic Phase) Lab Results  Component Value Date   CRP 0.5 04/05/2015   ESRSEDRATE 37 (H) 04/05/2015                 Rheumatology Markers No results found for: Elayne Guerin, [facility]              Renal Function Markers Lab Results  Component Value Date   BUN 11 12/07/2016   CREATININE 0.85 12/07/2016   GFRAA 89 01/27/2016   GFRNONAA 77 01/27/2016                 Hepatic Function Markers Lab Results  Component Value Date   AST 15 12/07/2016   ALT 9 12/07/2016   ALBUMIN 4.3 01/27/2016   ALKPHOS 118 (H) 01/27/2016   LIPASE 23 04/06/2015                 Electrolytes Lab Results  Component Value Date   NA 139 12/07/2016   K 4.4 12/07/2016   CL 102 12/07/2016   CALCIUM 9.5 12/07/2016   MG 2.1 04/05/2015                 Neuropathy Markers Lab Results  Component Value Date   HGBA1C 5.1 01/27/2016   HIV NON-REACTIVE 12/07/2016                 Bone Pathology Markers No results found for: [place], [id number], [id number], [id number], 25OHVITD1, 25OHVITD2, 25OHVITD3, TESTOFREE, TESTOSTERONE                Coagulation Parameters Lab Results  Component Value Date   INR 1.05 03/06/2015   LABPROT 13.9 03/06/2015   APTT 33 03/06/2015   PLT 381 12/07/2016                 Cardiovascular Markers Lab Results  Component Value Date   TROPONINI <0.03 03/06/2015   HGB 12.3 12/07/2016   HCT 37.5 12/07/2016                 CA Markers No results found for: CEA, CA125, LABCA2               Note: Lab results reviewed.  Recent Diagnostic Imaging Review  Cervical Imaging: Cervical MR wo contrast:  Results for orders placed during the hospital encounter of 04/16/13  MR Cervical Spine Wo Contrast   Narrative CLINICAL DATA:  Right-sided weakness and numbness post anterior and posterior cervical fusion.  EXAM: MRI CERVICAL SPINE WITHOUT CONTRAST  TECHNIQUE: Multiplanar, multisequence MR imaging was performed. No intravenous  contrast was administered.  COMPARISON:  Prior CT performed earlier on the same day and.  FINDINGS: The visualized portions of the brain and posterior fossa demonstrate a normal appearance with normal signal intensity. The craniocervical junction is widely patent.  The vertebral bodies are normally aligned with preservation of the normal cervical lordosis. Vertebral body heights are preserved.  Postoperative changes from recent C5 corpectomy with anterior plate screw fixation at C4 through C6. Posterior spinal fusion with bilateral transpedicular screws at C4 and C6 are present as well. The hardware appears well positioned without evidence of complication. Hyperintense T2 signal intensity within the posterior paraspinous soft tissues at the level of C3 through C6 is compatible likely postoperative edema. Small amount of fluid is also seen within the prevertebral soft tissues anterior to the cervical spine, also compatible with postoperative edema/effusion. No loculated fluid collection identified. No epidural hematoma. The cervical spinal cord itself  demonstrates a normal appearance with normal signal intensity without evidence of injury, although evaluation at the level of the cervical fusion is somewhat limited due to susceptibility artifact. No significant canal or neural foraminal stenosis identified at the C4 through C6 levels.  At C2-3, there is no significant canal or neural foraminal stenosis.  At C3-4, no significant degenerative disc disease. No canal or neural foraminal stenosis.  At C4-5, there is mild degenerative osteophytosis without significant canal or neural foraminal stenosis. No evidence of postoperative complication.  At C5-6, there is mild diffuse degenerative disc osteophytosis without significant canal or neural foraminal stenosis. No evidence of postoperative complication.  At C6-7, there is mild diffuse degenerative osteophytosis without significant canal or neural foraminal stenosis. No evidence of postoperative complication.  At C7-T1, minimal degenerative disc osteophyte and bilateral facet arthrosis. No canal or neural foraminal stenosis.  IMPRESSION: 1. Postoperative sequelae of recent cervical spinal fusion without complication. No evidence of epidural hematoma, cord injury, or significant canal or foraminal stenosis identified. 2. Hyperintense T2 signal intensity within the prevertebral soft tissues anterior the cervical spine as well as within the posterior is paraspinous soft tissues at C3 through C6, likely related to recent surgery. No loculated fluid collection identified.  Critical Value/emergent results were called by telephone at the time of interpretation on 04/17/2013 at 1:14 AM to Dr. Melina Schools , who verbally acknowledged these results.   Electronically Signed   By: Jeannine Boga M.D.   On: 04/17/2013 01:16    Cervical MR w/wo contrast:  Results for orders placed during the hospital encounter of 08/21/11  MR Cervical Spine W Wo Contrast   Addendum **ADDENDUM**  CREATED: 08/21/2011 15:13:15  The original report was by Dr. Van Clines.  The following addendum is by Dr. Van Clines:  "BUN and creatinine were obtained on site at [place] at [address]  Test results for your reference are:   Creatinine: 0.6   Bun:        7  **END ADDENDUM** SIGNED BY: Lynnell Chad. Janeece Fitting, M.D.      Carron Curie, MD 08/21/2011  3:15 PM         Narrative *RADIOLOGY REPORT*  Clinical Data: Neck pain.  Headaches.  Right arm pain.  Prior cervical spine surgery.  MRI CERVICAL SPINE WITHOUT AND WITH CONTRAST  Technique:  Multiplanar and multiecho pulse sequences of the cervical spine, to include the craniocervical junction and cervicothoracic junction, were obtained according to standard protocol without and with intravenous contrast.  Contrast: 63m MULTIHANCE GADOBENATE DIMEGLUMINE 529 MG/ML IV SOLN  Comparison: Multiple exams, including 08/14/2011 and 04/22/2010  Findings: The craniocervical junction appears unremarkable.  Metal artifact associated with plate screw fixator noted at C6-7.  No significant vertebral subluxation.  Low-level vertebral edema is present in the C5 vertebral body, increased from the prior exam.  No significant abnormal cord signal is identified.  There is 2 mm of degenerative anterior subluxation of C3-4 and C4- 5.  Additional findings at individual levels are as follows:  C2-3:  No impingement.  The right facet joint is probably fused.  C3-4:  Mild right foraminal stenosis secondary to facet and uncinate spurring.  C4-5: Borderline right foraminal stenosis secondary to facet and uncinate spurring. Disc bulge noted.  C5-6:  Mild right foraminal stenosis due to facet and uncinate spurring.  C6-7:  Mild right foraminal stenosis due to uncinate spurring. This level is fused.  C7-T1:  Unremarkable.  T1-2:  Unremarkable.  IMPRESSION:  1.  Mild right-sided foraminal impingement at C2-4,  C5-6, and C6-7 primarily due to spurring. 2.  Low-level edema in the C5 vertebral body is probably related to degenerative endplate findings at Q6-7.  Original Report Authenticated By: Carron Curie, M.D.    Cervical CT wo contrast:  Results for orders placed during the hospital encounter of 02/28/13  CT Cervical Spine Wo Contrast   Narrative CLINICAL DATA:  Dysphagia with bilateral arm numbness. Patient underwent cervical fusion 10/30/2012 and fell five days ago.  EXAM: CT CERVICAL SPINE WITHOUT CONTRAST  TECHNIQUE: Multidetector CT imaging of the cervical spine was performed without intravenous contrast. Multiplanar CT image reconstructions were also generated.  COMPARISON:  DG CERVICAL SPINE 2-3 VIEWS dated 10/30/2012; DG C-ARM 1-60 MIN dated 10/30/2012; MR C SPINE WO/W CM dated 08/21/2011  FINDINGS: Patient underwent revision of cervical fusion 10/30/2012 with C5-6 ACDF. Both C5 screws are now superiorly displaced into the C4-5 disc. There is lucency surrounding the C5 screws. The C6 screws are intact and well positioned. There is no displacement of the anterior plate or interbody metallic spacer. No solid interbody fusion is present at C5-6.  There is solid interbody fusion at C6-7. The C7 vertebral body screws have been removed.  The alignment is normal. There is no evidence of acute fracture or paraspinal hematoma.  IMPRESSION: 1. Both C5 screws are now superiorly displaced into the C4-5 disc space, a change from the intraoperative radiographs. The anterior plate and interbody spacer are stable in position. 2. No solid interbody fusion at C5-6. 3. Previous C6-7 fusion appears solid. 4. No evidence acute fracture or malalignment.   Electronically Signed   By: Camie Patience M.D.   On: 02/28/2013 15:00    Cervical DG 2-3 views:  Results for orders placed during the hospital encounter of 04/16/13  DG Cervical Spine 2 or 3 views   Narrative CLINICAL DATA:   Postoperative pain  EXAM: CERVICAL SPINE - 2-3 VIEW  COMPARISON:  04/16/2013  FINDINGS: Postoperative changes are noted from C4-C6 both anteriorly and posteriorly with corpectomy and bone graft placement. Mild prevertebral soft tissue swelling is seen although not significantly changed from that noted on prior MRI examination. No definitive air is noted in the surgical bed. A drain is seen.  IMPRESSION: Postoperative change without definitive acute abnormality.   Electronically Signed   By: Inez Catalina M.D.   On: 04/17/2013 08:14   DG Cervical Spine 2-3 Views   Narrative CLINICAL DATA:  Revision of cervical fusion and corpectomy.  EXAM: CERVICAL SPINE - 2-3 VIEW; DG C-ARM GT  120 MIN  COMPARISON:  Two views cervical spine 04/11/2013.  FINDINGS: We are provided with 2 fluoroscopic intraoperative spot views of the cervical spine. Images demonstrate new pedicle screws and stabilization bars from C4-C6. The patient has undergone C5 corpectomy with anterior plate and screws from C4 to C6. No acute finding is identified.  IMPRESSION: Postoperative change as described.   Electronically Signed   By: Inge Rise M.D.   On: 04/16/2013 19:29     Shoulder Imaging:  Shoulder-R MR wo contrast:  Results for orders placed in visit on 10/19/15  MR Shoulder Right Wo Contrast   Shoulder-L MR wo contrast:  Results for orders placed in visit on 10/19/15  MR Shoulder Left Wo Contrast    Complexity Note: Imaging results reviewed. Results shared with [name], using Layman's terms.                         Meds   Current Outpatient Medications:  .  acetaminophen (TYLENOL) 325 MG tablet, Take by mouth., Disp: , Rfl:  .  albuterol (PROVENTIL HFA) 108 (90 Base) MCG/ACT inhaler, Inhale into the lungs., Disp: , Rfl:  .  ALPRAZolam (XANAX) 1 MG tablet, TAKE 1/2 TAB BY MOUTH EVERY MORNING, 1/2 TAB AT LUNCH, AND 2 TABS AT BEDTIME, Disp: 90 tablet, Rfl: 5 .  buPROPion  (WELLBUTRIN XL) 300 MG 24 hr tablet, TAKE 1 TABLET BY MOUTH EVERY DAY, Disp: 30 tablet, Rfl: 1 .  Docusate Sodium (COLACE PO), Take by mouth 2 (two) times daily., Disp: , Rfl:  .  escitalopram (LEXAPRO) 20 MG tablet, Take 1 tablet (20 mg total) by mouth at bedtime. (Patient taking differently: Take 40 mg by mouth at bedtime. ), Disp: 90 tablet, Rfl: 3 .  metoprolol succinate (TOPROL-XL) 50 MG 24 hr tablet, TAKE 1 TABLET BY MOUTH EVERY DAY, Disp: 90 tablet, Rfl: 1 .  montelukast (SINGULAIR) 10 MG tablet, TAKE 1 TABLET (10 MG TOTAL) BY MOUTH DAILY., Disp: 90 tablet, Rfl: 1 .  oxyCODONE (OXY IR/ROXICODONE) 5 MG immediate release tablet, Take 1 tablet (5 mg total) by mouth every 6 (six) hours as needed for severe pain., Disp: 120 tablet, Rfl: 0 .  pantoprazole (PROTONIX) 40 MG tablet, Take 1 tablet (40 mg total) daily by mouth., Disp: 30 tablet, Rfl: 11 .  Spacer/Aero-Holding Chambers (AEROCHAMBER MV) inhaler, by Does not apply route., Disp: , Rfl:  .  valACYclovir (VALTREX) 1000 MG tablet, TAKE TWO TABLETS BY MOUTH TWICE DAILY FOR ONE DAY FEVER BLISTER, Disp: 4 tablet, Rfl: 5 .  cyclobenzaprine (FLEXERIL) 5 MG tablet, Take 1 tablet (5 mg total) by mouth 3 (three) times daily as needed for muscle spasms., Disp: 90 tablet, Rfl: 0 .  [START ON 02/19/2017] diazepam (VALIUM) 5 MG tablet, Take 1 tablet (5 mg total) by mouth as needed for up to 2 doses for anxiety (Take one tab 45 minutes before MRI. Take second tablet just prior to MRI scan). Do not take medication within 4 hours of taking opioid pain medications. Must have a driver. Do not drive or operate machinery x 24 hours after taking this medication.  MRI on 02/21/17, Disp: 2 tablet, Rfl: 0 .  oxyCODONE (OXY IR/ROXICODONE) 5 MG immediate release tablet, Take 1 tablet (5 mg total) by mouth every 6 (six) hours as needed for severe pain., Disp: 120 tablet, Rfl: 0 .  oxyCODONE (OXY IR/ROXICODONE) 5 MG immediate release tablet, Take 1 tablet (5 mg total) by  mouth  every 6 (six) hours as needed for severe pain., Disp: 120 tablet, Rfl: 0  ROS  Constitutional: Denies any fever or chills Gastrointestinal: No reported hemesis, hematochezia, vomiting, or acute GI distress Musculoskeletal: Denies any acute onset joint swelling, redness, loss of ROM, or weakness Neurological: No reported episodes of acute onset apraxia, aphasia, dysarthria, agnosia, amnesia, paralysis, loss of coordination, or loss of consciousness  Allergies  Ms. Guajardo is allergic to amoxicillin; chlorhexidine gluconate; clindamycin; codeine; erythromycin; penicillin g; sulfa antibiotics; shellfish allergy; decadron [dexamethasone]; mangifera indica; papaya derivatives; betadine [povidone iodine]; chlorhexidine; clarithromycin; clindamycin/lincomycin; other; povidone-iodine; and prednisone.  [place]  Drug: Ms. Jaye  reports that she does not use drugs. Alcohol:  reports that she does not drink alcohol. Tobacco:  reports that  has never smoked. she has never used smokeless tobacco. 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), Anemia, Anxiety, Asthma, Cephalalgia (08/18/2014), Cervical disc disease (08/18/2014), Chronic headaches, DDD (degenerative disc disease), Depression, Dizziness (04/22/2013), Duodenal ulcer with hemorrhage and perforation ([place]) (04/27/2003), GERD (gastroesophageal reflux disease), H/O arthrodesis (08/18/2014), Headache(784.0), History of blood transfusion, History of cervical spinal surgery (01/04/2015), History of kidney stones, Hypertension, Inverted T wave, Narrowing of intervertebral disc space (08/18/2014), Orthostatic hypotension (04/22/2013), Pain, Pneumonia, PONV (postoperative nausea and vomiting), Postop Hyponatremia (05/14/2012), Postoperative anemia due to acute blood loss (05/14/2012), Right foot drop, and Right hip arthralgia (08/18/2014). Surgical: Ms. Karstens  has a past surgical history that includes RIGHT HIP ARTHROSCOPY FOR LABRAL TEAR  (ABOUT 2010); Anterior fusion cervical spine (MAY 2012); Nasal septum surgery (MARCH 2013); Back surgery (2009); Tonsillectomy; Abdominal hysterectomy; DIAGNOSTIC LAPAROSCOPIES - MULTIPLE FOR ENDOMETRIOSIS; Cholecystectomy; Hip arthroscopy (09/20/2011); Shoulder arthroscopy (05-06-12); Carpal tunnel release (05-06-12); Total hip arthroplasty (Right, 05/13/2012); Anterior cervical decomp/discectomy fusion (N/A, 10/30/2012); Posterior cervical fusion/foraminotomy (N/A, 04/16/2013); Breast biopsy (Right, 2008); Breast excisional biopsy (Left, 1998); Breast reduction surgery (Bilateral, 06/2016); Colonoscopy with propofol (N/A, 01/26/2017); Esophagogastroduodenoscopy (egd) with propofol (N/A, 01/26/2017); Esophageal dilation (N/A, 01/26/2017); and polypectomy (N/A, 01/26/2017). Family: family history includes Aneurysm in her maternal grandmother and mother; Breast cancer (age of onset: 1) in her paternal grandmother.  Constitutional Exam  General appearance: Well nourished, well developed, and well hydrated. In no apparent acute distress Vitals:   02/13/17 0846  BP: (!) 145/89  Pulse: 63  Resp: 16  Temp: 98.3 F (36.8 C)  TempSrc: Oral  SpO2: 98%  Weight: 155 lb (70.3 kg)  Height: 5' 4"  (1.626 m)   BMI Assessment: Estimated body mass index is 26.61 kg/m as calculated from the following:   Height as of this encounter: 5' 4"  (1.626 m).   Weight as of this encounter: 155 lb (70.3 kg).  BMI interpretation table: BMI level Category Range association with higher incidence of chronic pain  <18 kg/m2 Underweight   18.5-24.9 kg/m2 Ideal body weight   25-29.9 kg/m2 Overweight Increased incidence by 20%  30-34.9 kg/m2 Obese (Class I) Increased incidence by 68%  35-39.9 kg/m2 Severe obesity (Class II) Increased incidence by 136%  >40 kg/m2 Extreme obesity (Class III) Increased incidence by 254%   BMI Readings from Last 4 Encounters:  02/13/17 26.61 kg/m  01/26/17 26.43 kg/m  01/04/17 26.61 kg/m   12/13/16 26.61 kg/m   Wt Readings from Last 4 Encounters:  02/13/17 155 lb (70.3 kg)  01/26/17 154 lb (69.9 kg)  01/04/17 155 lb (70.3 kg)  12/13/16 155 lb (70.3 kg)  Psych/Mental status: Alert, oriented x 3 (person, place, & time)       Eyes:  PERLA Respiratory: No evidence of acute respiratory distress  Cervical Spine Area Exam  Skin & Axial Inspection: No masses, redness, edema, swelling, or associated skin lesions Alignment: Symmetrical Functional ROM: Unrestricted ROM      Stability: No instability detected Muscle Tone/Strength: Functionally intact. No obvious neuro-muscular anomalies detected. Sensory (Neurological): Unimpaired Palpation: Positive             muscle tone (Trigger Point)  Upper Extremity (UE) Exam    Side: Right upper extremity  Side: Left upper extremity  Skin & Extremity Inspection: Skin color, temperature, and hair growth are WNL. No peripheral edema or cyanosis. No masses, redness, swelling, asymmetry, or associated skin lesions. No contractures.  Skin & Extremity Inspection: Skin color, temperature, and hair growth are WNL. No peripheral edema or cyanosis. No masses, redness, swelling, asymmetry, or associated skin lesions. No contractures.  Functional ROM: Unrestricted ROM          Functional ROM: Pain restricted ROM+          Muscle Tone/Strength: Functionally intact. No obvious neuro-muscular anomalies detected.  Muscle Tone/Strength: Movement possible against some resistance (4/5)  Sensory (Neurological): Unimpaired          Sensory (Neurological): Unimpaired          Palpation: No palpable anomalies              Palpation: No palpable anomalies              Specialized Test(s): Deferred         Specialized Test(s): Deferred           Assessment  Primary Diagnosis & Pertinent Problem List: The primary encounter diagnosis was Chronic shoulder radicular pain (Bilateral) (L>R). 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  Status Diagnosis  Controlled Controlled Controlled 1. Chronic shoulder radicular pain (Bilateral) (L>R)   2. Chronic shoulder pain (Secondary source of pain) (Bilateral) (L>R)   3. 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 Chronic pain syndrome     Problems updated and reviewed during this visit: No problems updated. Plan of Care  Pharmacotherapy (Medications Ordered): Meds ordered this encounter  Medications  . cyclobenzaprine (FLEXERIL) 5 MG tablet    Sig: Take 1 tablet (5 mg total) by mouth 3 (three) times daily as needed for muscle spasms.    Dispense:  90 tablet    Refill:  0    Do not place this medication, or any other prescription from our practice, on "Automatic Refill". Patient may have prescription filled one day early if pharmacy is closed on scheduled refill date.    Order Specific Question:   Supervising Provider    Answer:   Milinda Pointer [[id number]]  This SmartLink is deprecated. Use AVSMEDLIST instead to display the medication list for a patient. Medications administered today: Dajanique Robley. Rockers had no medications administered during this visit. Lab-work, procedure(s), and/or referral(s): Orders Placed This Encounter  Procedures  . MR SHOULDER LEFT W WO CONTRAST   Imaging and/or referral(s): MR SHOULDER LEFT W WO CONTRAST  Interventional management options: Planned, scheduled, and/or pending:   Declined Trigger point injection. Declined NSAIDs. Norflex injection not an option secondary to no driver.   Considering:   Palliativebilateral suprascapular nerve block Possible bilateral suprascapular nerve RFA   Palliative PRN treatment(s):   None at this time   Provider-requested follow-up: No Follow-up on file.  Future Appointments  Date Time Provider [place]  02/21/2017  9:30 AM [facility]   02/26/2017  9:30 AM Vevelyn Francois, NP [facility] None  12/10/2017  9:30 AM [facility]NURSE HEALTH ADVISOR [facility] None  12/10/2017 10:00 AM Bacigalupo, Dionne Bucy, MD [facility] None   Primary Care Physician: Shelby Crews, MD Location: [facility] Note by: Vevelyn Francois NP Date: 02/13/2017; Time: 1:57 PM  Pain Score Disclaimer: We use the NRS-11 scale. This is a self-reported, subjective measurement of pain severity with only modest accuracy. It is used primarily to identify changes within a particular patient. It must be understood that outpatient pain scales are significantly less accurate that those used for research, where they can be applied under ideal controlled circumstances with minimal exposure to variables. In reality, the score is likely to be a combination of pain intensity and pain affect, where pain affect describes the degree of emotional arousal or changes in action readiness caused by the sensory experience of pain. Factors such as social and work situation, setting, emotional state, anxiety levels, expectation, and prior pain experience may influence pain perception and show large inter-individual differences that may also be affected by time variables.  Patient instructions provided during this appointment: Patient Instructions    ____________________________________________________________________________________________  Medication Rules  Applies to: All patients receiving prescriptions (written or electronic).  Pharmacy of record: Pharmacy where electronic prescriptions will be sent. If written prescriptions are taken to a different pharmacy, please inform the nursing staff. The pharmacy listed in the electronic medical record should be the one where you would like electronic prescriptions to be sent.  Prescription refills: Only during scheduled appointments. Applies to both, written and electronic prescriptions.  NOTE: The following  applies primarily to controlled substances (Opioid* Pain Medications).   Patient's responsibilities: 1. Pain Pills: Bring all pain pills to every appointment (except for procedure appointments). 2. Pill Bottles: Bring pills in original pharmacy bottle. Always bring newest bottle. Bring bottle, even if empty. 3. Medication refills: You are responsible for knowing and keeping track of what medications you need refilled. The day before your appointment, write a list of all prescriptions that need to be refilled. Bring that list to your appointment and give it to the admitting nurse. Prescriptions will be written only during appointments. If you forget a medication, it will not be "Called in", "Faxed", or "electronically sent". You will need to get another appointment to get these prescribed. 4. Prescription Accuracy: You are responsible for carefully inspecting your prescriptions before leaving our office. Have the discharge nurse carefully go over each prescription with you, before taking them home. Make sure that your name is accurately spelled, that your address is correct. Check the name and dose of your medication to make sure it is accurate. Check the number of pills, and the written instructions to make sure they are clear and accurate. Make sure that you are given enough medication to last until your next medication refill appointment. 5. Taking Medication: Take medication as prescribed. Never take more pills than instructed. Never take medication more frequently than prescribed. Taking less pills or less frequently is permitted and encouraged, when it comes to controlled substances (written prescriptions).  6. Inform other Doctors: Always inform, all of your healthcare providers, of all the medications you take. 7. Pain Medication from other Providers: You are not allowed to accept any additional pain medication from any other Doctor or Healthcare provider. There are two exceptions to this rule. (see  below) In the event that you require additional pain medication, you are responsible for notifying us, as stated below. 8. Medication Agreement: You are responsible for carefully reading and following our Medication Agreement. This must be signed before receiving any prescriptions from our practice. Safely store a copy of your signed Agreement. Violations to the Agreement will result in no further prescriptions. (Additional copies of our Medication Agreement are available upon request.) 9. Laws, Rules, & Regulations: All patients are expected to follow all Federal and [employer], Rules, [employer]. Ignorance of the Laws does not constitute a valid excuse. The use of any illegal substances is prohibited. 10. Adopted CDC guidelines & recommendations: Target dosing levels will be at or below 60 MME/day. Use of benzodiazepines** is not recommended.  Exceptions: There are only two exceptions to the rule of not receiving pain medications from other Healthcare Providers. 1. Exception #1 (Emergencies): In the event of an emergency (i.e.: accident requiring emergency care), you are allowed to receive additional pain medication. However, you are responsible for: As soon as you are able, call our office [phone], at any time of the day or night, and leave a message stating your name, the date and nature of the emergency, and the name and dose of the medication prescribed. In the event that your call is answered by a member of our staff, make sure to document and save the date, time, and the name of the person that took your information.  2. Exception #2 (Planned Surgery): In the event that you are scheduled by another doctor or dentist to have any type of surgery or procedure, you are allowed (for a period no longer than 30 days), to receive additional pain medication, for the acute post-op pain. However, in this case, you are responsible for picking up a copy of our "Post-op Pain Management for  Surgeons" handout, and giving it to your surgeon or dentist. This document is available at our office, and does not require an appointment to obtain it. Simply go to our office during business hours (Monday-Thursday from 8:00 AM to 4:00 PM) (Friday 8:00 AM to 12:00 Noon) or if you have a scheduled appointment with [place], prior to your surgery, and ask for it by name. In addition, you will need to provide [place] with your name, name of your surgeon, type of surgery, and date of procedure or surgery.  *Opioid medications include: morphine, codeine, oxycodone, oxymorphone, hydrocodone, hydromorphone, meperidine, tramadol, tapentadol, buprenorphine, fentanyl, methadone. **Benzodiazepine medications include: diazepam (Valium), alprazolam (Xanax), clonazepam (Klonopine), lorazepam (Ativan), clorazepate (Tranxene), chlordiazepoxide (Librium), estazolam (Prosom), oxazepam (Serax), temazepam (Restoril), triazolam (Halcion)  ____________________________________________________________________________________________   BMI Assessment: Estimated body mass index is 26.61 kg/m as calculated from the following:   Height as of this encounter: 5' 4"  (1.626 m).   Weight as of this encounter: 155 lb (70.3 kg).  BMI interpretation table: BMI level Category Range association with higher incidence of chronic pain  <18 kg/m2 Underweight   18.5-24.9 kg/m2 Ideal body weight   25-29.9 kg/m2 Overweight Increased incidence by 20%  30-34.9 kg/m2 Obese (Class I) Increased incidence by 68%  35-39.9 kg/m2 Severe obesity (Class II) Increased incidence by 136%  >40 kg/m2 Extreme obesity (Class III) Increased incidence by 254%   BMI Readings from Last 4 Encounters:  02/13/17 26.61 kg/m  01/26/17 26.43 kg/m  01/04/17 26.61 kg/m  12/13/16 26.61 kg/m   Wt Readings from Last 4 Encounters:  02/13/17 155 lb (70.3 kg)  01/26/17 154 lb (69.9 kg)  01/04/17 155 lb (70.3 kg)  12/13/16 155 lb (70.3 kg)

## 2017-02-13 NOTE — Patient Instructions (Addendum)
____________________________________________________________________________________________  Medication Rules  Applies to: All patients receiving prescriptions (written or electronic).  Pharmacy of record: Pharmacy where electronic prescriptions will be sent. If written prescriptions are taken to a different pharmacy, please inform the nursing staff. The pharmacy listed in the electronic medical record should be the one where you would like electronic prescriptions to be sent.  Prescription refills: Only during scheduled appointments. Applies to both, written and electronic prescriptions.  NOTE: The following applies primarily to controlled substances (Opioid* Pain Medications).   Patient's responsibilities: 1. Pain Pills: Bring all pain pills to every appointment (except for procedure appointments). 2. Pill Bottles: Bring pills in original pharmacy bottle. Always bring newest bottle. Bring bottle, even if empty. 3. Medication refills: You are responsible for knowing and keeping track of what medications you need refilled. The day before your appointment, write a list of all prescriptions that need to be refilled. Bring that list to your appointment and give it to the admitting nurse. Prescriptions will be written only during appointments. If you forget a medication, it will not be "Called in", "Faxed", or "electronically sent". You will need to get another appointment to get these prescribed. 4. Prescription Accuracy: You are responsible for carefully inspecting your prescriptions before leaving our office. Have the discharge nurse carefully go over each prescription with you, before taking them home. Make sure that your name is accurately spelled, that your address is correct. Check the name and dose of your medication to make sure it is accurate. Check the number of pills, and the written instructions to make sure they are clear and accurate. Make sure that you are given enough medication to  last until your next medication refill appointment. 5. Taking Medication: Take medication as prescribed. Never take more pills than instructed. Never take medication more frequently than prescribed. Taking less pills or less frequently is permitted and encouraged, when it comes to controlled substances (written prescriptions).  6. Inform other Doctors: Always inform, all of your healthcare providers, of all the medications you take. 7. Pain Medication from other Providers: You are not allowed to accept any additional pain medication from any other Doctor or Healthcare provider. There are two exceptions to this rule. (see below) In the event that you require additional pain medication, you are responsible for notifying us, as stated below. 8. Medication Agreement: You are responsible for carefully reading and following our Medication Agreement. This must be signed before receiving any prescriptions from our practice. Safely store a copy of your signed Agreement. Violations to the Agreement will result in no further prescriptions. (Additional copies of our Medication Agreement are available upon request.) 9. Laws, Rules, & Regulations: All patients are expected to follow all Federal and State Laws, Statutes, Rules, & Regulations. Ignorance of the Laws does not constitute a valid excuse. The use of any illegal substances is prohibited. 10. Adopted CDC guidelines & recommendations: Target dosing levels will be at or below 60 MME/day. Use of benzodiazepines** is not recommended.  Exceptions: There are only two exceptions to the rule of not receiving pain medications from other Healthcare Providers. 1. Exception #1 (Emergencies): In the event of an emergency (i.e.: accident requiring emergency care), you are allowed to receive additional pain medication. However, you are responsible for: As soon as you are able, call our office [phone], at any time of the day or night, and leave a message stating your  name, the date and nature of the emergency, and the name and dose of the medication   prescribed. In the event that your call is answered by a member of our staff, make sure to document and save the date, time, and the name of the person that took your information.  2. Exception #2 (Planned Surgery): In the event that you are scheduled by another doctor or dentist to have any type of surgery or procedure, you are allowed (for a period no longer than 30 days), to receive additional pain medication, for the acute post-op pain. However, in this case, you are responsible for picking up a copy of our "Post-op Pain Management for Surgeons" handout, and giving it to your surgeon or dentist. This document is available at our office, and does not require an appointment to obtain it. Simply go to our office during business hours (Monday-Thursday from 8:00 AM to 4:00 PM) (Friday 8:00 AM to 12:00 Noon) or if you have a scheduled appointment with [place], prior to your surgery, and ask for it by name. In addition, you will need to provide [place] with your name, name of your surgeon, type of surgery, and date of procedure or surgery.  *Opioid medications include: morphine, codeine, oxycodone, oxymorphone, hydrocodone, hydromorphone, meperidine, tramadol, tapentadol, buprenorphine, fentanyl, methadone. **Benzodiazepine medications include: diazepam (Valium), alprazolam (Xanax), clonazepam (Klonopine), lorazepam (Ativan), clorazepate (Tranxene), chlordiazepoxide (Librium), estazolam (Prosom), oxazepam (Serax), temazepam (Restoril), triazolam (Halcion)  ____________________________________________________________________________________________   BMI Assessment: Estimated body mass index is 26.61 kg/m as calculated from the following:   Height as of this encounter: 5\' 4"  (1.626 m).   Weight as of this encounter: 155 lb (70.3 kg).  BMI interpretation table: BMI level Category Range association with higher incidence of chronic  pain  <18 kg/m2 Underweight   18.5-24.9 kg/m2 Ideal body weight   25-29.9 kg/m2 Overweight Increased incidence by 20%  30-34.9 kg/m2 Obese (Class I) Increased incidence by 68%  35-39.9 kg/m2 Severe obesity (Class II) Increased incidence by 136%  >40 kg/m2 Extreme obesity (Class III) Increased incidence by 254%   BMI Readings from Last 4 Encounters:  02/13/17 26.61 kg/m  01/26/17 26.43 kg/m  01/04/17 26.61 kg/m  12/13/16 26.61 kg/m   Wt Readings from Last 4 Encounters:  02/13/17 155 lb (70.3 kg)  01/26/17 154 lb (69.9 kg)  01/04/17 155 lb (70.3 kg)  12/13/16 155 lb (70.3 kg)

## 2017-02-13 NOTE — Progress Notes (Signed)
Safety precautions to be maintained throughout the outpatient stay will include: orient to surroundings, keep bed in low position, maintain call bell within reach at all times, provide assistance with transfer out of bed and ambulation.  

## 2017-02-13 NOTE — Telephone Encounter (Signed)
Called to patient to let her know that an Rx has been written for valium 5 mg qty 2 and she can pick that up at her convenience.

## 2017-02-14 NOTE — Telephone Encounter (Signed)
Tried contacting pt but no vm to leave message.

## 2017-02-15 NOTE — Telephone Encounter (Signed)
Patient left voice message that she is still waiting on results.

## 2017-02-15 NOTE — Telephone Encounter (Signed)
Please send the patient off for a barium swallow.  I do not think an EGD is needed at this time.  If the barium swallow does not show any abnormality she will need to be set up for a esophageal manometry.

## 2017-02-15 NOTE — Telephone Encounter (Signed)
Pt notified of procedures results.   Pt stated she has not felt any improvement in her dysphagia at all since you stretched her esophagus. Food is still getting stuck. She did say she wears a brace around her neck? She is taking the Dexilant but only for a week. Please advise if she should be set up for another EGD or barium swallow?

## 2017-02-21 ENCOUNTER — Ambulatory Visit: Admission: RE | Admit: 2017-02-21 | Payer: Medicare Other | Source: Ambulatory Visit

## 2017-02-23 ENCOUNTER — Other Ambulatory Visit: Payer: Self-pay

## 2017-02-23 DIAGNOSIS — R1310 Dysphagia, unspecified: Secondary | ICD-10-CM

## 2017-02-23 NOTE — Telephone Encounter (Signed)
Pt scheduled for a barium swallow at [facility] on Tuesday, Dec 18th @ 8:00am. Pt has been advised of this information and to be NPO 3 hours prior to scan.

## 2017-02-26 ENCOUNTER — Ambulatory Visit: Payer: Medicare Other | Attending: Nurse Practitioner | Admitting: Nurse Practitioner

## 2017-02-26 ENCOUNTER — Encounter: Payer: Self-pay | Admitting: Nurse Practitioner

## 2017-02-26 ENCOUNTER — Other Ambulatory Visit: Payer: Self-pay

## 2017-02-26 VITALS — BP 126/89 | HR 54 | Temp 98.2°F | Ht 64.0 in | Wt 155.0 lb

## 2017-02-26 DIAGNOSIS — Z87442 Personal history of urinary calculi: Secondary | ICD-10-CM | POA: Insufficient documentation

## 2017-02-26 DIAGNOSIS — M25511 Pain in right shoulder: Secondary | ICD-10-CM | POA: Insufficient documentation

## 2017-02-26 DIAGNOSIS — J45909 Unspecified asthma, uncomplicated: Secondary | ICD-10-CM | POA: Diagnosis not present

## 2017-02-26 DIAGNOSIS — Z79899 Other long term (current) drug therapy: Secondary | ICD-10-CM | POA: Diagnosis not present

## 2017-02-26 DIAGNOSIS — Z9889 Other specified postprocedural states: Secondary | ICD-10-CM | POA: Diagnosis not present

## 2017-02-26 DIAGNOSIS — R51 Headache: Secondary | ICD-10-CM | POA: Diagnosis not present

## 2017-02-26 DIAGNOSIS — M25551 Pain in right hip: Secondary | ICD-10-CM | POA: Diagnosis not present

## 2017-02-26 DIAGNOSIS — Z91013 Allergy to seafood: Secondary | ICD-10-CM | POA: Diagnosis not present

## 2017-02-26 DIAGNOSIS — Z79891 Long term (current) use of opiate analgesic: Secondary | ICD-10-CM | POA: Diagnosis not present

## 2017-02-26 DIAGNOSIS — M25512 Pain in left shoulder: Secondary | ICD-10-CM | POA: Diagnosis not present

## 2017-02-26 DIAGNOSIS — M62838 Other muscle spasm: Secondary | ICD-10-CM | POA: Diagnosis not present

## 2017-02-26 DIAGNOSIS — K589 Irritable bowel syndrome without diarrhea: Secondary | ICD-10-CM | POA: Diagnosis not present

## 2017-02-26 DIAGNOSIS — G894 Chronic pain syndrome: Secondary | ICD-10-CM | POA: Diagnosis not present

## 2017-02-26 DIAGNOSIS — G8929 Other chronic pain: Secondary | ICD-10-CM | POA: Diagnosis not present

## 2017-02-26 DIAGNOSIS — I10 Essential (primary) hypertension: Secondary | ICD-10-CM | POA: Insufficient documentation

## 2017-02-26 DIAGNOSIS — Z96641 Presence of right artificial hip joint: Secondary | ICD-10-CM | POA: Diagnosis not present

## 2017-02-26 DIAGNOSIS — M160 Bilateral primary osteoarthritis of hip: Secondary | ICD-10-CM | POA: Insufficient documentation

## 2017-02-26 DIAGNOSIS — F329 Major depressive disorder, single episode, unspecified: Secondary | ICD-10-CM | POA: Diagnosis not present

## 2017-02-26 DIAGNOSIS — Z5181 Encounter for therapeutic drug level monitoring: Secondary | ICD-10-CM | POA: Diagnosis not present

## 2017-02-26 DIAGNOSIS — K219 Gastro-esophageal reflux disease without esophagitis: Secondary | ICD-10-CM | POA: Diagnosis not present

## 2017-02-26 DIAGNOSIS — Z888 Allergy status to other drugs, medicaments and biological substances status: Secondary | ICD-10-CM | POA: Diagnosis not present

## 2017-02-26 DIAGNOSIS — M545 Low back pain, unspecified: Secondary | ICD-10-CM

## 2017-02-26 DIAGNOSIS — Z880 Allergy status to penicillin: Secondary | ICD-10-CM | POA: Insufficient documentation

## 2017-02-26 DIAGNOSIS — D125 Benign neoplasm of sigmoid colon: Secondary | ICD-10-CM | POA: Diagnosis not present

## 2017-02-26 DIAGNOSIS — Z885 Allergy status to narcotic agent status: Secondary | ICD-10-CM | POA: Diagnosis not present

## 2017-02-26 MED ORDER — OXYCODONE HCL 5 MG PO TABS: 5.0000 mg | ORAL_TABLET | Freq: Four times a day (QID) | ORAL | 0 refills | Status: DC | PRN

## 2017-02-26 MED ORDER — CYCLOBENZAPRINE HCL 5 MG PO TABS: 5.0000 mg | ORAL_TABLET | Freq: Three times a day (TID) | ORAL | 0 refills | Status: DC | PRN

## 2017-02-26 NOTE — Patient Instructions (Addendum)
____________________________________________________________________________________________  Medication Rules  Applies to: All patients receiving prescriptions (written or electronic).  Pharmacy of record: Pharmacy where electronic prescriptions will be sent. If written prescriptions are taken to a different pharmacy, please inform the nursing staff. The pharmacy listed in the electronic medical record should be the one where you would like electronic prescriptions to be sent.  Prescription refills: Only during scheduled appointments. Applies to both, written and electronic prescriptions.  NOTE: The following applies primarily to controlled substances (Opioid* Pain Medications).   Patient's responsibilities: 1. Pain Pills: Bring all pain pills to every appointment (except for procedure appointments). 2. Pill Bottles: Bring pills in original pharmacy bottle. Always bring newest bottle. Bring bottle, even if empty. 3. Medication refills: You are responsible for knowing and keeping track of what medications you need refilled. The day before your appointment, write a list of all prescriptions that need to be refilled. Bring that list to your appointment and give it to the admitting nurse. Prescriptions will be written only during appointments. If you forget a medication, it will not be "Called in", "Faxed", or "electronically sent". You will need to get another appointment to get these prescribed. 4. Prescription Accuracy: You are responsible for carefully inspecting your prescriptions before leaving our office. Have the discharge nurse carefully go over each prescription with you, before taking them home. Make sure that your name is accurately spelled, that your address is correct. Check the name and dose of your medication to make sure it is accurate. Check the number of pills, and the written instructions to make sure they are clear and accurate. Make sure that you are given enough medication to  last until your next medication refill appointment. 5. Taking Medication: Take medication as prescribed. Never take more pills than instructed. Never take medication more frequently than prescribed. Taking less pills or less frequently is permitted and encouraged, when it comes to controlled substances (written prescriptions).  6. Inform other Doctors: Always inform, all of your healthcare providers, of all the medications you take. 7. Pain Medication from other Providers: You are not allowed to accept any additional pain medication from any other Doctor or Healthcare provider. There are two exceptions to this rule. (see below) In the event that you require additional pain medication, you are responsible for notifying us, as stated below. 8. Medication Agreement: You are responsible for carefully reading and following our Medication Agreement. This must be signed before receiving any prescriptions from our practice. Safely store a copy of your signed Agreement. Violations to the Agreement will result in no further prescriptions. (Additional copies of our Medication Agreement are available upon request.) 9. Laws, Rules, & Regulations: All patients are expected to follow all Federal and State Laws, Statutes, Rules, & Regulations. Ignorance of the Laws does not constitute a valid excuse. The use of any illegal substances is prohibited. 10. Adopted CDC guidelines & recommendations: Target dosing levels will be at or below 60 MME/day. Use of benzodiazepines** is not recommended.  Exceptions: There are only two exceptions to the rule of not receiving pain medications from other Healthcare Providers. 1. Exception #1 (Emergencies): In the event of an emergency (i.e.: accident requiring emergency care), you are allowed to receive additional pain medication. However, you are responsible for: As soon as you are able, call our office [phone], at any time of the day or night, and leave a message stating your  name, the date and nature of the emergency, and the name and dose of the medication   prescribed. In the event that your call is answered by a member of our staff, make sure to document and save the date, time, and the name of the person that took your information.  2. Exception #2 (Planned Surgery): In the event that you are scheduled by another doctor or dentist to have any type of surgery or procedure, you are allowed (for a period no longer than 30 days), to receive additional pain medication, for the acute post-op pain. However, in this case, you are responsible for picking up a copy of our "Post-op Pain Management for Surgeons" handout, and giving it to your surgeon or dentist. This document is available at our office, and does not require an appointment to obtain it. Simply go to our office during business hours (Monday-Thursday from 8:00 AM to 4:00 PM) (Friday 8:00 AM to 12:00 Noon) or if you have a scheduled appointment with [place], prior to your surgery, and ask for it by name. In addition, you will need to provide [place] with your name, name of your surgeon, type of surgery, and date of procedure or surgery.  *Opioid medications include: morphine, codeine, oxycodone, oxymorphone, hydrocodone, hydromorphone, meperidine, tramadol, tapentadol, buprenorphine, fentanyl, methadone. **Benzodiazepine medications include: diazepam (Valium), alprazolam (Xanax), clonazepam (Klonopine), lorazepam (Ativan), clorazepate (Tranxene), chlordiazepoxide (Librium), estazolam (Prosom), oxazepam (Serax), temazepam (Restoril), triazolam (Halcion)  ____________________________________________________________________________________________  Oxycodone prescription given x3.

## 2017-02-26 NOTE — Progress Notes (Signed)
Nursing Pain Medication Assessment:  Safety precautions to be maintained throughout the outpatient stay will include: orient to surroundings, keep bed in low position, maintain call bell within reach at all times, provide assistance with transfer out of bed and ambulation.  Medication Inspection Compliance: Pill count conducted under aseptic conditions, in front of the patient. Neither the pills nor the bottle was removed from the patient's sight at any time. Once count was completed pills were immediately returned to the patient in their original bottle.  Medication: Morphine IR Pill/Patch Count: 59 of 120 pills remain Pill/Patch Appearance: Markings consistent with prescribed medication Bottle Appearance: Standard pharmacy container. Clearly labeled. Filled Date: 1 /2 / 2018 Last Medication intake:  Today

## 2017-02-26 NOTE — Progress Notes (Signed)
Patient's Name: [name]  MRN: [id number]  Referring Provider: Virginia Crews, MD  DOB: 1960-12-01  PCP: Virginia Crews, MD  DOS: 02/26/2017  Note by: Vevelyn Francois NP  Service setting: Ambulatory outpatient  Specialty: Interventional Pain Management  Location: [facility] (AMB) Pain Management Facility    Patient type: Established    Primary Reason(s) for Visit: Encounter for prescription drug management. (Level of risk: moderate)  CC: Shoulder Pain (left)  HPI  [name] is a 56 y.o. year old, female patient, who comes today for a medication management evaluation. She has Chronic neck pain; Anxiety state; Asthma, mild; Blurred vision; Benign paroxysmal positional nystagmus; Congenital renal agenesis and dysgenesis; Clinical depression; Colon, diverticulosis; Fatigue; Cannot sleep; IBS (irritable bowel syndrome); Awareness of heartbeats; RAD (reactive airway disease); Apnea, sleep; Esophagitis, reflux; DOE (dyspnea on exertion); Chronic hip pain (Right); Encounter for therapeutic drug level monitoring; Long term current use of opiate analgesic; Long term prescription opiate use; Opiate use (30 MME/Day); Chronic pain syndrome; Steroid intolerance; Cervical spondylosis; Cervical facet arthropathy (Bilateral); History of cervical spinal surgery; Chronic shoulder pain (Secondary source of pain) (Bilateral) (L>R); 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 Her primarily concern today is the Shoulder Pain (left)  Pain Assessment: Location: Left Shoulder Radiating: Radiates down left arm to hands, radiates to middle of back Onset: More than a month ago Duration: Chronic pain Quality: Cramping, Sharp, Constant Severity: 4 /10 (self-reported pain score)  Note: Reported level is compatible with observation.                          Effect on ADL:   Timing: Constant Modifying factors: rest, heating pad, messages  [name] was last scheduled for an appointment on 02/13/2017 for medication management. During today's appointment we reviewed [name]'s chronic pain status, as well as her outpatient medication regimen. She admits that her left shoulder ha not improved. She has not had the MRI secondary to snow. She has not rescheduled. She is concern about the hip surgery and having to use crutches post op. She states that she can not have the Trigger point secondary to allergy to steroids. She states that the Muscle relaxer is effective.   The patient  reports that she does not use drugs. Her body mass index is 26.61 kg/m.  Further details on both, my assessment(s), as well as the proposed treatment plan, please see below.  Controlled Substance Pharmacotherapy Assessment REMS (Risk Evaluation and Mitigation Strategy)  Analgesic:Oxycodone IR 5 mg every 6 hours (20 mg/day) MME/day:30 mg/day   Chauncey Fischer, RN  02/26/2017 10:17 AM  Sign at close encounter Nursing Pain Medication Assessment:  Safety precautions to be maintained throughout the outpatient stay will include: orient to surroundings, keep bed in  low position,  maintain call bell within reach at all times, provide assistance with transfer out of bed and ambulation.  Medication Inspection Compliance: Pill count conducted under aseptic conditions, in front of the patient. Neither the pills nor the bottle was removed from the patient's sight at any time. Once count was completed pills were immediately returned to the patient in their original bottle.  Medication: Oxycodone IR Pill/Patch Count: 59 of 120 pills remain Pill/Patch Appearance: Markings consistent with prescribed medication Bottle Appearance: Standard pharmacy container. Clearly labeled. Filled Date: 59 /2 / 2018 Last Medication intake:  Today   Pharmacokinetics: Liberation and absorption (onset of action): WNL Distribution (time to peak effect): WNL Metabolism and excretion (duration of action): WNL         Pharmacodynamics: Desired effects: Analgesia: [name] reports >50% benefit. Functional ability: Patient reports that medication allows her to accomplish basic ADLs Clinically meaningful improvement in function (CMIF): Sustained CMIF goals met Perceived effectiveness: Described as relatively effective, allowing for increase in activities of daily living (ADL) Undesirable effects: Side-effects or Adverse reactions: None reported Monitoring: [place] PMP: Online review of the past 42-monthperiod conducted. Compliant with practice rules and regulations Last UDS on record: Summary  Date Value Ref Range Status  07/13/2016 FINAL  Final    Comment:    ==================================================================== TOXASSURE SELECT 13 (MW) ==================================================================== Test                             Result       Flag       Units Drug Present and Declared for Prescription Verification   Alprazolam                     287          EXPECTED   ng/mg creat   Alpha-hydroxyalprazolam        287          EXPECTED   ng/mg creat     Source of alprazolam is a scheduled prescription medication.    Alpha-hydroxyalprazolam is an expected metabolite of alprazolam. Drug Absent but Declared for Prescription Verification   Oxycodone                      Not Detected UNEXPECTED ng/mg creat ==================================================================== Test                      Result    Flag   Units      Ref Range   Creatinine              78               mg/dL      >=20 ==================================================================== Declared Medications:  The flagging and interpretation on this report are based on the  following declared medications.  Unexpected results may arise from  inaccuracies in the declared medications.  **Note: The testing scope of this panel includes these medications:  Alprazolam  Alprazolam (Xanax)  Oxycodone  **Note: The testing scope of this panel does not include following  reported medications:  Bupropion (Wellbutrin)  Citalopram (Lexapro)  Fluticasone  Loratadine  Metoprolol (Toprol)  Montelukast (Singulair)  Omeprazole (Prilosec)  Ondansetron  Ondansetron (Zofran)  Valacyclovir ==================================================================== For clinical consultation, please call ([phone] ====================================================================    UDS interpretation: Compliant          Medication Assessment Form: Reviewed. Patient indicates being compliant with  therapy Treatment compliance: Compliant Risk Assessment Profile: Aberrant behavior: See prior evaluations. None observed or detected today Comorbid factors increasing risk of overdose: See prior notes. No additional risks detected today Risk of substance use disorder (SUD): Low Opioid Risk Tool - 02/26/17 1011      Family History of Substance Abuse   Alcohol  Negative    Illegal Drugs  Negative    Rx Drugs  Negative      Personal History of Substance Abuse   Alcohol  Negative     Illegal Drugs  Negative    Rx Drugs  Negative      Age   Age between 74-45 years   No      History of Preadolescent Sexual Abuse   History of Preadolescent Sexual Abuse  Negative or Female      Psychological Disease   Depression  Positive      Total Score   Opioid Risk Tool Scoring  1    Opioid Risk Interpretation  Low Risk      ORT Scoring interpretation table:  Score <3 = Low Risk for SUD  Score between 4-7 = Moderate Risk for SUD  Score >8 = High Risk for Opioid Abuse   Risk Mitigation Strategies:  Patient Counseling: Covered Patient-Prescriber Agreement (PPA): Present and active  Notification to other healthcare providers: Done  Pharmacologic Plan: No change in therapy, at this time  Laboratory Chemistry  Inflammation Markers (CRP: Acute Phase) (ESR: Chronic Phase) Lab Results  Component Value Date   CRP 0.5 04/05/2015   ESRSEDRATE 37 (H) 04/05/2015                 Rheumatology Markers No results found for: Elayne Guerin, [facility]              Renal Function Markers Lab Results  Component Value Date   BUN 11 12/07/2016   CREATININE 0.85 12/07/2016   GFRAA 89 01/27/2016   GFRNONAA 77 01/27/2016                 Hepatic Function Markers Lab Results  Component Value Date   AST 15 12/07/2016   ALT 9 12/07/2016   ALBUMIN 4.3 01/27/2016   ALKPHOS 118 (H) 01/27/2016   LIPASE 23 04/06/2015                 Electrolytes Lab Results  Component Value Date   NA 139 12/07/2016   K 4.4 12/07/2016   CL 102 12/07/2016   CALCIUM 9.5 12/07/2016   MG 2.1 04/05/2015                 Neuropathy Markers Lab Results  Component Value Date   HGBA1C 5.1 01/27/2016   HIV NON-REACTIVE 12/07/2016                 Bone Pathology Markers No results found for: VD25OH, [id number], XN2355DD2, KG2542HC6, 25OHVITD1, 25OHVITD2, 25OHVITD3, TESTOFREE, TESTOSTERONE               Coagulation Parameters Lab Results  Component Value Date   INR 1.05  03/06/2015   LABPROT 13.9 03/06/2015   APTT 33 03/06/2015   PLT 381 12/07/2016                 Cardiovascular Markers Lab Results  Component Value Date   TROPONINI <0.03 03/06/2015   HGB 12.3 12/07/2016   HCT 37.5 12/07/2016  CA Markers No results found for: CEA, CA125, LABCA2               Note: Lab results reviewed.  Recent Diagnostic Imaging Results  MR HIP LEFT WO CONTRAST CLINICAL DATA:  Chronic left hip pain. The patient is status post right hip replacement 5 years ago.  EXAM: MR OF THE LEFT HIP WITHOUT CONTRAST  TECHNIQUE: Multiplanar, multisequence MR imaging was performed. No intravenous contrast was administered.  COMPARISON:  CT abdomen and pelvis 08/28/2016.  FINDINGS: Bones: Artifact from the patient's right hip replacement is noted. Bone marrow signal is normal without fracture, stress change or focal lesion. No subchondral edema or cyst formation about the left hip is identified.  Articular cartilage and labrum  Articular cartilage:  Appears preserved.  Labrum: The anterior, superior labrum appears degenerated but no tear is identified.  Joint or bursal effusion  Joint effusion:  None.  Bursae:  Normal.  Muscles and tendons  Muscles and tendons:  Intact.  Other findings  Miscellaneous: Imaged intrapelvic contents demonstrate no acute abnormality. The patient is status post hysterectomy.  IMPRESSION: Mildly degenerated appearance of the anterior, superior left acetabular labrum. The left hip is otherwise unremarkable.  Status post right hip replacement. No complicating feature is identified.  Electronically Signed   By: Inge Rise M.D.   On: 01/11/2017 13:42  Complexity Note: Imaging results reviewed. Results shared with [name], using Layman's terms.                         Meds   Current Outpatient Medications:  .  albuterol (PROVENTIL HFA) 108 (90 Base) MCG/ACT inhaler, Inhale into the lungs., Disp:  , Rfl:  .  ALPRAZolam (XANAX) 1 MG tablet, TAKE 1/2 TAB BY MOUTH EVERY MORNING, 1/2 TAB AT LUNCH, AND 2 TABS AT BEDTIME, Disp: 90 tablet, Rfl: 5 .  buPROPion (WELLBUTRIN XL) 300 MG 24 hr tablet, TAKE 1 TABLET BY MOUTH EVERY DAY, Disp: 30 tablet, Rfl: 1 .  cyclobenzaprine (FLEXERIL) 5 MG tablet, Take 1 tablet (5 mg total) by mouth 3 (three) times daily as needed for muscle spasms., Disp: 90 tablet, Rfl: 0 .  Docusate Sodium (COLACE PO), Take by mouth 2 (two) times daily., Disp: , Rfl:  .  escitalopram (LEXAPRO) 20 MG tablet, Take 1 tablet (20 mg total) by mouth at bedtime. (Patient taking differently: Take 40 mg by mouth at bedtime. ), Disp: 90 tablet, Rfl: 3 .  metoprolol succinate (TOPROL-XL) 50 MG 24 hr tablet, TAKE 1 TABLET BY MOUTH EVERY DAY, Disp: 90 tablet, Rfl: 1 .  montelukast (SINGULAIR) 10 MG tablet, TAKE 1 TABLET (10 MG TOTAL) BY MOUTH DAILY., Disp: 90 tablet, Rfl: 1 .  [START ON 05/12/2017] oxyCODONE (OXY IR/ROXICODONE) 5 MG immediate release tablet, Take 1 tablet (5 mg total) by mouth every 6 (six) hours as needed for severe pain., Disp: 120 tablet, Rfl: 0 .  pantoprazole (PROTONIX) 40 MG tablet, Take 1 tablet (40 mg total) daily by mouth., Disp: 30 tablet, Rfl: 11 .  Spacer/Aero-Holding Chambers (AEROCHAMBER MV) inhaler, by Does not apply route., Disp: , Rfl:  .  valACYclovir (VALTREX) 1000 MG tablet, TAKE TWO TABLETS BY MOUTH TWICE DAILY FOR ONE DAY FEVER BLISTER, Disp: 4 tablet, Rfl: 5 .  [START ON 04/12/2017] oxyCODONE (OXY IR/ROXICODONE) 5 MG immediate release tablet, Take 1 tablet (5 mg total) by mouth every 6 (six) hours as needed for severe pain., Disp: 120 tablet, Rfl: 0 .  [  START ON 03/13/2017] oxyCODONE (OXY IR/ROXICODONE) 5 MG immediate release tablet, Take 1 tablet (5 mg total) by mouth every 6 (six) hours as needed for severe pain., Disp: 120 tablet, Rfl: 0  ROS  Constitutional: Denies any fever or chills Gastrointestinal: No reported hemesis, hematochezia, vomiting, or acute  GI distress Musculoskeletal: Denies any acute onset joint swelling, redness, loss of ROM, or weakness Neurological: No reported episodes of acute onset apraxia, aphasia, dysarthria, agnosia, amnesia, paralysis, loss of coordination, or loss of consciousness  Allergies  Ms. [place] is allergic to amoxicillin; chlorhexidine gluconate; clindamycin; codeine; erythromycin; penicillin g; sulfa antibiotics; shellfish allergy; decadron [dexamethasone]; mangifera indica; papaya derivatives; betadine [povidone iodine]; chlorhexidine; clarithromycin; clindamycin/lincomycin; other; povidone-iodine; and prednisone.  Olean  Drug: Ms. Blecha  reports that she does not use drugs. Alcohol:  reports that she does not drink alcohol. Tobacco:  reports that  has never smoked. she has never used smokeless tobacco. 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), Anemia, Anxiety, Asthma, Cephalalgia (08/18/2014), Cervical disc disease (08/18/2014), Chronic headaches, DDD (degenerative disc disease), Depression, Dizziness (04/22/2013), Duodenal ulcer with hemorrhage and perforation ([place]) (04/27/2003), GERD (gastroesophageal reflux disease), H/O arthrodesis (08/18/2014), Headache(784.0), History of blood transfusion, History of cervical spinal surgery (01/04/2015), History of kidney stones, Hypertension, Inverted T wave, Narrowing of intervertebral disc space (08/18/2014), Orthostatic hypotension (04/22/2013), Pain, Pneumonia, PONV (postoperative nausea and vomiting), Postop Hyponatremia (05/14/2012), Postoperative anemia due to acute blood loss (05/14/2012), Right foot drop, and Right hip arthralgia (08/18/2014). Surgical: Ms. Yogi  has a past surgical history that includes RIGHT HIP ARTHROSCOPY FOR LABRAL TEAR (ABOUT 2010); Anterior fusion cervical spine (MAY 2012); Nasal septum surgery (MARCH 2013); Back surgery (2009); Tonsillectomy; Abdominal hysterectomy; DIAGNOSTIC LAPAROSCOPIES - MULTIPLE FOR  ENDOMETRIOSIS; Cholecystectomy; Hip arthroscopy (09/20/2011); Shoulder arthroscopy (05-06-12); Carpal tunnel release (05-06-12); Total hip arthroplasty (Right, 05/13/2012); Anterior cervical decomp/discectomy fusion (N/A, 10/30/2012); Posterior cervical fusion/foraminotomy (N/A, 04/16/2013); Breast biopsy (Right, 2008); Breast excisional biopsy (Left, 1998); Breast reduction surgery (Bilateral, 06/2016); Colonoscopy with propofol (N/A, 01/26/2017); Esophagogastroduodenoscopy (egd) with propofol (N/A, 01/26/2017); Esophageal dilation (N/A, 01/26/2017); and polypectomy (N/A, 01/26/2017). Family: family history includes Aneurysm in her maternal grandmother and mother; Breast cancer (age of onset: 50) in her paternal grandmother.  Constitutional Exam  General appearance: Well nourished, well developed, and well hydrated. In no apparent acute distress Vitals:   02/26/17 0957  BP: 126/89  Pulse: (!) 54  Temp: 98.2 F (36.8 C)  SpO2: 100%  Weight: 155 lb (70.3 kg)  Height: 5' 4" (1.626 m)   BMI Assessment: Estimated body mass index is 26.61 kg/m as calculated from the following:   Height as of this encounter: 5' 4" (1.626 m).   Weight as of this encounter: 155 lb (70.3 kg). Psych/Mental status: Alert, oriented x 3 (person, place, & time)       Eyes: PERLA Respiratory: No evidence of acute respiratory distress  Cervical Spine Area Exam  Skin & Axial Inspection: No masses, redness, edema, swelling, or associated skin lesions Alignment: Symmetrical Functional ROM: Unrestricted ROM      Stability: No instability detected Muscle Tone/Strength: Functionally intact. No obvious neuro-muscular anomalies detected. Sensory (Neurological): Unimpaired Palpation: No palpable anomalies              Upper Extremity (UE) Exam    Side: Right upper extremity  Side: Left upper extremity  Skin & Extremity Inspection: Edema  Skin & Extremity Inspection: Skin color, temperature, and hair growth are WNL. No peripheral  edema or  cyanosis. No masses, redness, swelling, asymmetry, or associated skin lesions. No contractures.  Functional ROM: Adequate ROM          Functional ROM: Unrestricted ROM          Muscle Tone/Strength: Functionally intact. No obvious neuro-muscular anomalies detected.  Muscle Tone/Strength: Functionally intact. No obvious neuro-muscular anomalies detected.  Sensory (Neurological): Unimpaired          Sensory (Neurological): Unimpaired          Palpation: Tender              Palpation: No palpable anomalies              Specialized Test(s): Deferred         Specialized Test(s): Deferred          Lumbar Spine Area Exam  Skin & Axial Inspection: No masses, redness, or swelling Alignment: Symmetrical Functional ROM: Unrestricted ROM      Stability: No instability detected Muscle Tone/Strength: Functionally intact. No obvious neuro-muscular anomalies detected. Sensory (Neurological): Unimpaired Palpation: No palpable anomalies       Provocative Tests: Lumbar Hyperextension and rotation test: evaluation deferred today       Lumbar Lateral bending test: evaluation deferred today       Patrick's Maneuver: evaluation deferred today                    Gait & Posture Assessment  Ambulation: Unassisted Gait: Relatively normal for age and body habitus Posture: WNL   Lower Extremity Exam    Side: Right lower extremity  Side: Left lower extremity  Skin & Extremity Inspection: Skin color, temperature, and hair growth are WNL. No peripheral edema or cyanosis. No masses, redness, swelling, asymmetry, or associated skin lesions. No contractures.  Skin & Extremity Inspection: Skin color, temperature, and hair growth are WNL. No peripheral edema or cyanosis. No masses, redness, swelling, asymmetry, or associated skin lesions. No contractures.  Functional ROM: Unrestricted ROM          Functional ROM: Unrestricted ROM          Muscle Tone/Strength: Functionally intact. No obvious neuro-muscular  anomalies detected.  Muscle Tone/Strength: Functionally intact. No obvious neuro-muscular anomalies detected.  Sensory (Neurological): Unimpaired  Sensory (Neurological): Unimpaired  Palpation: No palpable anomalies  Palpation: No palpable anomalies   Assessment  Primary Diagnosis & Pertinent Problem List: The primary encounter diagnosis was Chronic low back pain [facility] source of pain) (Bilateral) (L>R). Diagnoses of Chronic shoulder pain (Secondary source of pain) (Bilateral) (L>R), Chronic hip pain (Right), Chronic pain syndrome, Long term prescription opiate use, and Muscle spasm were also pertinent to this visit.  Status Diagnosis  Controlled Controlled Controlled 1. Chronic low back pain [facility] source of pain) (Bilateral) (L>R)   2. Chronic shoulder pain (Secondary source of pain) (Bilateral) (L>R)   3. Chronic hip pain (Right)   4. Chronic pain syndrome   5. Long term prescription opiate use   6. Muscle spasm     Problems updated and reviewed during this visit: No problems updated. Plan of Care  Pharmacotherapy (Medications Ordered): Meds ordered this encounter  Medications  . oxyCODONE (OXY IR/ROXICODONE) 5 MG immediate release tablet    Sig: Take 1 tablet (5 mg total) by mouth every 6 (six) hours as needed for severe pain.    Dispense:  120 tablet    Refill:  0    Fill one day early if pharmacy is closed on scheduled refill date. Do not  fill until:05/12/2017 To last until: 06/11/2017    Order Specific Question:   Supervising Provider    Answer:   Milinda Pointer [phone]  . oxyCODONE (OXY IR/ROXICODONE) 5 MG immediate release tablet    Sig: Take 1 tablet (5 mg total) by mouth every 6 (six) hours as needed for severe pain.    Dispense:  120 tablet    Refill:  0    Fill one day early if pharmacy is closed on scheduled refill date. Do not fill until: 04/12/2017 To last until: 05/12/2017    Order Specific Question:   Supervising Provider    Answer:   Milinda Pointer  [phone]  . oxyCODONE (OXY IR/ROXICODONE) 5 MG immediate release tablet    Sig: Take 1 tablet (5 mg total) by mouth every 6 (six) hours as needed for severe pain.    Dispense:  120 tablet    Refill:  0    Fill one day early if pharmacy is closed on scheduled refill date. Do not fill until: 03/13/2017 To last until:04/12/2017    Order Specific Question:   Supervising Provider    Answer:   Milinda Pointer [phone]  . cyclobenzaprine (FLEXERIL) 5 MG tablet    Sig: Take 1 tablet (5 mg total) by mouth 3 (three) times daily as needed for muscle spasms.    Dispense:  90 tablet    Refill:  0    Do not place this medication, or any other prescription from our practice, on "Automatic Refill". Patient may have prescription filled one day early if pharmacy is closed on scheduled refill date.    Order Specific Question:   Supervising Provider    Answer:   Milinda Pointer [[id number]]  This SmartLink is deprecated. Use AVSMEDLIST instead to display the medication list for a patient. Medications administered today: Alonni Heimsoth. Messing had no medications administered during this visit. Lab-work, procedure(s), and/or referral(s): Orders Placed This Encounter  Procedures  . ToxASSURE Select 13 (MW), Urine   Imaging and/or referral(s): None  Interventional management options: Planned, scheduled, and/or pending: Awaiting MRI   Considering: Palliativebilateral suprascapular nerve block Possible bilateral suprascapular nerve RFA   Palliative PRN treatment(s): None at this time   Provider-requested follow-up: Return in about 3 months (around 05/27/2017) for MedMgmt with Me Donella Stade Edison Pace).  Future Appointments  Date Time Provider [place]  02/27/2017  8:00 AM [id number]  03/15/2017 11:00 AM Virginia Crews, MD [facility] None  05/24/2017 11:30 AM Vevelyn Francois, NP [facility] None  12/10/2017  9:30 AM [facility]NURSE HEALTH ADVISOR [facility] None  12/10/2017 10:00 AM  B[place]al[place], Dionne Bucy, MD [facility] None   Primary Care Physician: Virginia Crews, MD Location: [facility] Note by: Vevelyn Francois NP Date: 02/26/2017; Time: 11:58 AM  Pain Score Disclaimer: We use the NRS-11 scale. This is a self-reported, subjective measurement of pain severity with only modest accuracy. It is used primarily to identify changes within a particular patient. It must be understood that outpatient pain scales are significantly less accurate that those used for research, where they can be applied under ideal controlled circumstances with minimal exposure to variables. In reality, the score is likely to be a combination of pain intensity and pain affect, where pain affect describes the degree of emotional arousal or changes in action readiness caused by the sensory experience of pain. Factors such as social and work situation, setting, emotional state, anxiety levels, expectation, and prior pain experience may influence pain perception  and show large inter-individual differences that may also be affected by time variables.  Patient instructions provided during this appointment: Patient Instructions   ____________________________________________________________________________________________  Medication Rules  Applies to: All patients receiving prescriptions (written or electronic).  Pharmacy of record: Pharmacy where electronic prescriptions will be sent. If written prescriptions are taken to a different pharmacy, please inform the nursing staff. The pharmacy listed in the electronic medical record should be the one where you would like electronic prescriptions to be sent.  Prescription refills: Only during scheduled appointments. Applies to both, written and electronic prescriptions.  NOTE: The following applies primarily to controlled substances (Opioid* Pain Medications).   Patient's responsibilities: 1. Pain Pills: Bring all pain pills to  every appointment (except for procedure appointments). 2. Pill Bottles: Bring pills in original pharmacy bottle. Always bring newest bottle. Bring bottle, even if empty. 3. Medication refills: You are responsible for knowing and keeping track of what medications you need refilled. The day before your appointment, write a list of all prescriptions that need to be refilled. Bring that list to your appointment and give it to the admitting nurse. Prescriptions will be written only during appointments. If you forget a medication, it will not be "Called in", "Faxed", or "electronically sent". You will need to get another appointment to get these prescribed. 4. Prescription Accuracy: You are responsible for carefully inspecting your prescriptions before leaving our office. Have the discharge nurse carefully go over each prescription with you, before taking them home. Make sure that your name is accurately spelled, that your address is correct. Check the name and dose of your medication to make sure it is accurate. Check the number of pills, and the written instructions to make sure they are clear and accurate. Make sure that you are given enough medication to last until your next medication refill appointment. 5. Taking Medication: Take medication as prescribed. Never take more pills than instructed. Never take medication more frequently than prescribed. Taking less pills or less frequently is permitted and encouraged, when it comes to controlled substances (written prescriptions).  6. Inform other Doctors: Always inform, all of your healthcare providers, of all the medications you take. 7. Pain Medication from other Providers: You are not allowed to accept any additional pain medication from any other Doctor or Healthcare provider. There are two exceptions to this rule. (see below) In the event that you require additional pain medication, you are responsible for notifying us, as stated below. 8. Medication  Agreement: You are responsible for carefully reading and following our Medication Agreement. This must be signed before receiving any prescriptions from our practice. Safely store a copy of your signed Agreement. Violations to the Agreement will result in no further prescriptions. (Additional copies of our Medication Agreement are available upon request.) 9. Laws, Rules, & Regulations: All patients are expected to follow all Federal and [employer], [employer], Rules, [employer]. Ignorance of the Laws does not constitute a valid excuse. The use of any illegal substances is prohibited. 10. Adopted CDC guidelines & recommendations: Target dosing levels will be at or below 60 MME/day. Use of benzodiazepines** is not recommended.  Exceptions: There are only two exceptions to the rule of not receiving pain medications from other Healthcare Providers. 1. Exception #1 (Emergencies): In the event of an emergency (i.e.: accident requiring emergency care), you are allowed to receive additional pain medication. However, you are responsible for: As soon as you are able, call our office [phone], at any time of the day  or night, and leave a message stating your name, the date and nature of the emergency, and the name and dose of the medication prescribed. In the event that your call is answered by a member of our staff, make sure to document and save the date, time, and the name of the person that took your information.  2. Exception #2 (Planned Surgery): In the event that you are scheduled by another doctor or dentist to have any type of surgery or procedure, you are allowed (for a period no longer than 30 days), to receive additional pain medication, for the acute post-op pain. However, in this case, you are responsible for picking up a copy of our "Post-op Pain Management for Surgeons" handout, and giving it to your surgeon or dentist. This document is available at our office, and does not require an appointment  to obtain it. Simply go to our office during business hours (Monday-Thursday from 8:00 AM to 4:00 PM) (Friday 8:00 AM to 12:00 Noon) or if you have a scheduled appointment with [place], prior to your surgery, and ask for it by name. In addition, you will need to provide [place] with your name, name of your surgeon, type of surgery, and date of procedure or surgery.  *Opioid medications include: morphine, codeine, oxycodone, oxymorphone, hydrocodone, hydromorphone, meperidine, tramadol, tapentadol, buprenorphine, fentanyl, methadone. **Benzodiazepine medications include: diazepam (Valium), alprazolam (Xanax), clonazepam (Klonopine), lorazepam (Ativan), clorazepate (Tranxene), chlordiazepoxide (Librium), estazolam (Prosom), oxazepam (Serax), temazepam (Restoril), triazolam (Halcion)  ____________________________________________________________________________________________  Oxycodone prescription given x3.

## 2017-02-27 ENCOUNTER — Ambulatory Visit
Admission: RE | Admit: 2017-02-27 | Discharge: 2017-02-27 | Disposition: A | Discharge status: Home / Self Care | Payer: Medicare Other | Source: Ambulatory Visit | Attending: Gastroenterology | Admitting: Gastroenterology

## 2017-02-27 DIAGNOSIS — K219 Gastro-esophageal reflux disease without esophagitis: Secondary | ICD-10-CM | POA: Diagnosis not present

## 2017-02-27 DIAGNOSIS — K222 Esophageal obstruction: Secondary | ICD-10-CM | POA: Insufficient documentation

## 2017-02-27 DIAGNOSIS — R1310 Dysphagia, unspecified: Secondary | ICD-10-CM | POA: Insufficient documentation

## 2017-02-28 ENCOUNTER — Ambulatory Visit: Payer: Medicare Other | Admitting: Nurse Practitioner

## 2017-03-09 ENCOUNTER — Telehealth: Payer: Self-pay

## 2017-03-09 ENCOUNTER — Other Ambulatory Visit: Payer: Self-pay

## 2017-03-09 NOTE — Telephone Encounter (Signed)
-----   Message from Lucilla Lame, MD sent at 03/01/2017  5:21 PM EST ----- Please have the patient come in for a follow up.

## 2017-03-09 NOTE — Telephone Encounter (Signed)
Pt scheduled for a follow up appt with Dr. Allen Norris on 03/21/17.

## 2017-03-15 ENCOUNTER — Encounter: Payer: Self-pay | Admitting: Family Medicine

## 2017-03-15 ENCOUNTER — Ambulatory Visit: Payer: Medicare Other | Admitting: Family Medicine

## 2017-03-15 VITALS — BP 102/64 | HR 58 | Temp 97.6°F | Resp 16 | Wt 163.0 lb

## 2017-03-15 DIAGNOSIS — Z79891 Long term (current) use of opiate analgesic: Secondary | ICD-10-CM | POA: Diagnosis not present

## 2017-03-15 DIAGNOSIS — M160 Bilateral primary osteoarthritis of hip: Secondary | ICD-10-CM | POA: Diagnosis not present

## 2017-03-15 DIAGNOSIS — F411 Generalized anxiety disorder: Secondary | ICD-10-CM | POA: Diagnosis not present

## 2017-03-15 NOTE — Assessment & Plan Note (Signed)
Followed by pain management Discussed with patient possible reasons that her opioid dose was decreased Though she is complaining of extreme pain, she appears comfortable currently Discussed dangers of taking chronic opioids and benzos concomitantly  Will refer to psych for management of benzos and anxiety

## 2017-03-15 NOTE — Assessment & Plan Note (Signed)
Uncontrolled Acute worsening of anxiety is situational per patient Referral to psych for further management No changes to medications at this time Discussed concerns regarding chronic benzo and chronic opioid therapy.
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
## 2017-03-15 NOTE — Assessment & Plan Note (Signed)
Completed disability parking application Followed by Ortho and Pain management

## 2017-03-19 ENCOUNTER — Telehealth: Payer: Self-pay | Admitting: *Deleted

## 2017-03-19 ENCOUNTER — Other Ambulatory Visit: Payer: Self-pay | Admitting: Nurse Practitioner

## 2017-03-19 DIAGNOSIS — G8929 Other chronic pain: Secondary | ICD-10-CM

## 2017-03-19 DIAGNOSIS — M25512 Pain in left shoulder: Principal | ICD-10-CM

## 2017-03-19 NOTE — Telephone Encounter (Signed)
It has been ordered.

## 2017-03-19 NOTE — Telephone Encounter (Signed)
Crystal, will you please order this?

## 2017-03-20 ENCOUNTER — Ambulatory Visit
Admission: RE | Admit: 2017-03-20 | Discharge: 2017-03-20 | Disposition: A | Discharge status: Home / Self Care | Payer: Medicare Other | Source: Ambulatory Visit | Attending: Nurse Practitioner | Admitting: Nurse Practitioner

## 2017-03-20 DIAGNOSIS — M19012 Primary osteoarthritis, left shoulder: Secondary | ICD-10-CM

## 2017-03-20 DIAGNOSIS — M7552 Bursitis of left shoulder: Secondary | ICD-10-CM | POA: Diagnosis not present

## 2017-03-20 DIAGNOSIS — M25511 Pain in right shoulder: Secondary | ICD-10-CM

## 2017-03-20 DIAGNOSIS — M5412 Radiculopathy, cervical region: Secondary | ICD-10-CM

## 2017-03-20 DIAGNOSIS — G8929 Other chronic pain: Secondary | ICD-10-CM | POA: Insufficient documentation

## 2017-03-20 DIAGNOSIS — M19011 Primary osteoarthritis, right shoulder: Secondary | ICD-10-CM

## 2017-03-20 DIAGNOSIS — M25512 Pain in left shoulder: Secondary | ICD-10-CM | POA: Diagnosis not present

## 2017-03-20 NOTE — Telephone Encounter (Signed)
[name] put this order in.

## 2017-03-21 ENCOUNTER — Ambulatory Visit (INDEPENDENT_AMBULATORY_CARE_PROVIDER_SITE_OTHER): Payer: Medicare Other | Admitting: Gastroenterology

## 2017-03-21 ENCOUNTER — Other Ambulatory Visit: Payer: Self-pay | Admitting: Physician Assistant

## 2017-03-21 ENCOUNTER — Encounter: Payer: Self-pay | Admitting: Gastroenterology

## 2017-03-21 VITALS — BP 124/70 | HR 61 | Ht 64.0 in | Wt 164.5 lb

## 2017-03-21 DIAGNOSIS — R1313 Dysphagia, pharyngeal phase: Secondary | ICD-10-CM | POA: Diagnosis not present

## 2017-03-21 DIAGNOSIS — F411 Generalized anxiety disorder: Secondary | ICD-10-CM

## 2017-03-21 NOTE — Telephone Encounter (Signed)
RX called in at [facility]  

## 2017-03-21 NOTE — Progress Notes (Signed)
Primary Care Physician: Virginia Crews, MD  Primary Gastroenterologist:  Dr. Lucilla Lame  Chief Complaint  Patient presents with  . Follow up barium swallow results    HPI: [name] is a 57 y.o. female here for follow-up after having an upper endoscopy for dysphagia. The patient had a 60 French dilator passed over her entire esophagus. The patient continued to have symptoms and was sent for a barium swallow. The barium swallow showed the patient to have a large cricopharyngeal muscle. The patient continues to have reflux symptoms and reports that she also has symptoms of regurgitation. There is no report of any unexplained weight loss fevers chills nausea or vomiting. The patient states that it just feels like food keeps getting stuck in her throat.  Current Outpatient Medications  Medication Sig Dispense Refill  . albuterol (PROVENTIL HFA) 108 (90 Base) MCG/ACT inhaler Inhale into the lungs.    . ALPRAZolam (XANAX) 1 MG tablet TAKE 1/2 TAB BY MOUTH EVERY MORNING, 1/2 TAB AT LUNCH, AND 2 TABS AT BEDTIME 90 tablet 5  . buPROPion (WELLBUTRIN XL) 300 MG 24 hr tablet TAKE 1 TABLET BY MOUTH EVERY DAY 30 tablet 1  . cyclobenzaprine (FLEXERIL) 5 MG tablet Take 1 tablet (5 mg total) by mouth 3 (three) times daily as needed for muscle spasms. 90 tablet 0  . Docusate Sodium (COLACE PO) Take by mouth 2 (two) times daily.    Marland Kitchen escitalopram (LEXAPRO) 20 MG tablet Take 1 tablet (20 mg total) by mouth at bedtime. 90 tablet 3  . metoprolol succinate (TOPROL-XL) 50 MG 24 hr tablet TAKE 1 TABLET BY MOUTH EVERY DAY 90 tablet 1  . montelukast (SINGULAIR) 10 MG tablet TAKE 1 TABLET (10 MG TOTAL) BY MOUTH DAILY. 90 tablet 1  . [START ON 05/12/2017] oxyCODONE (OXY IR/ROXICODONE) 5 MG immediate release tablet Take 1 tablet (5 mg total) by mouth every 6 (six) hours as needed for severe pain. 120 tablet 0  . [START ON 04/12/2017] oxyCODONE (OXY IR/ROXICODONE) 5 MG immediate release tablet Take 1 tablet  (5 mg total) by mouth every 6 (six) hours as needed for severe pain. 120 tablet 0  . oxyCODONE (OXY IR/ROXICODONE) 5 MG immediate release tablet Take 1 tablet (5 mg total) by mouth every 6 (six) hours as needed for severe pain. 120 tablet 0  . pantoprazole (PROTONIX) 40 MG tablet Take 1 tablet (40 mg total) daily by mouth. 30 tablet 11  . Spacer/Aero-Holding Chambers (AEROCHAMBER MV) inhaler by Does not apply route.    . valACYclovir (VALTREX) 1000 MG tablet TAKE TWO TABLETS BY MOUTH TWICE DAILY FOR ONE DAY FEVER BLISTER 4 tablet 5   No current facility-administered medications for this visit.     Allergies as of 03/21/2017 - Review Complete 03/21/2017  Allergen Reaction Noted  . Amoxicillin Hives 08/20/2014  . Chlorhexidine gluconate Dermatitis and Hives 07/06/2016  . Clindamycin Hives 08/20/2014  . Codeine Hives 08/21/2011  . Erythromycin Hives 07/06/2016  . Penicillin g Hives 07/06/2016  . Sulfa antibiotics Nausea And Vomiting and Hives 09/06/2011  . Shellfish allergy Hives 10/08/2012  . Decadron [dexamethasone] Other (See Comments) 09/20/2011  . Mangifera indica Hives 07/20/2016  . Papaya derivatives Hives 10/30/2012  . Betadine [povidone iodine] Hives 09/06/2011  . Chlorhexidine Hives 09/06/2011  . Clarithromycin Hives 09/08/2011  . Clindamycin/lincomycin Hives 04/30/2012  . Other Hives 09/06/2011  . Povidone-iodine Hives 09/06/2011  . Prednisone Anxiety 08/18/2014    ROS:  General: Negative for anorexia, weight  loss, fever, chills, fatigue, weakness. ENT: Negative for hoarseness, difficulty swallowing , nasal congestion. CV: Negative for chest pain, angina, palpitations, dyspnea on exertion, peripheral edema.  Respiratory: Negative for dyspnea at rest, dyspnea on exertion, cough, sputum, wheezing.  GI: See history of present illness. GU:  Negative for dysuria, hematuria, urinary incontinence, urinary frequency, nocturnal urination.  Endo: Negative for unusual weight  change.    Physical Examination:   BP 124/70   Pulse 61   Ht 5\' 4"  (1.626 m)   Wt 164 lb 8 oz (74.6 kg)   BMI 28.24 kg/m   General: Well-nourished, well-developed in no acute distress.  Eyes: No icterus. Conjunctivae pink. Extremities: No lower extremity edema. No clubbing or deformities. Neuro: Alert and oriented x 3.  Grossly intact. Skin: Warm and dry, no jaundice.   Psych: Alert and cooperative, normal mood and affect.  Labs:    Imaging Studies: Dg Esophagus  Result Date: 02/27/2017 CLINICAL DATA:  57 year old female with dysphagia. Symptoms started after neck surgery 2 years ago. Recent endoscopy noted two esophageal ulcers. Dilation at such time. Initial encounter. EXAM: ESOPHOGRAM / BARIUM SWALLOW / BARIUM TABLET STUDY TECHNIQUE: Combined double contrast and single contrast examination performed using effervescent crystals, thick barium liquid, and thin barium liquid. The patient was observed with fluoroscopy swallowing a 13 mm barium sulphate tablet. FLUOROSCOPY TIME:  Fluoroscopy Time:  1 minutes and 48 seconds. Radiation Exposure Index:  8.8 mGy. COMPARISON:  None. FINDINGS: Anterior and posterior fusion mid cervical spine. No laryngeal penetration or aspiration. Slightly prominent cricopharyngeal muscle. Intermittent mild smooth circumferential narrowing at the junction of the cervical and thoracic esophagus. No esophageal ulcer identified as noted on recent endoscopy. Prominent reflux to level of the upper thoracic esophagus with change of patient position. Patient attempted but was not able to ingest a 13 mm barium tablet. IMPRESSION: Slightly prominent cricopharyngeal muscle. Intermittent mild smooth circumferential narrowing at the junction of the cervical and thoracic esophagus. No esophageal ulcer identified as noted on recent endoscopy. Prominent reflux to level of the upper thoracic esophagus with change of patient position. With this degree of reflux, patient may benefit  from endoscopic surveillance to exclude development of Barrett's esophagus. Patient attempted but was not able to ingest a 13 mm barium tablet. Felt that the pill was too large. Electronically Signed   By: Genia Del M.D.   On: 02/27/2017 09:31   Mr Shoulder Left Wo Contrast  Result Date: 03/20/2017 CLINICAL DATA:  Left shoulder pain and weakness for 4 months. EXAM: MRI OF THE LEFT SHOULDER WITHOUT CONTRAST TECHNIQUE: Multiplanar, multisequence MR imaging of the shoulder was performed. No intravenous contrast was administered. COMPARISON:  None. FINDINGS: Examination is somewhat limited by patient motion. Rotator cuff: Mild to moderate rotator cuff tendinopathy/tendinosis but no partial or full-thickness tear. Muscles: Edema like signal abnormality in the supraspinatus muscle could be a muscle strain, partial tear or denervation process. The other muscles appear normal. Biceps long head:  Intact Acromioclavicular Joint: Mild degenerative changes type 1-2 acromion. No lateral downsloping or undersurface spurring. Glenohumeral Joint: No degenerative changes or joint effusion. There is thickening of the capsular structures in the axillary recess which can be seen with adhesive capsulitis or synovitis. Labrum:  No obvious labral tears. Bones:  No acute bony findings. Other: Mild subacromial/subdeltoid bursitis. IMPRESSION: 1. Mild to moderate rotator cuff tendinopathy/tendinosis but no partial or full-thickness tear. 2. Intact long head biceps tendon and glenoid labrum. 3. Mild edema like signal abnormality in the supraspinatus  muscle could be due to a muscle strain, partial tear or denervation process. 4. No significant findings for bony impingement. 5. Mild subacromial/subdeltoid bursitis. Electronically Signed   By: Marijo Sanes M.D.   On: 03/20/2017 11:04    Assessment and Plan:   LAWRIE TUNKS is a 57 y.o. y/o female with a upper GI series showing cricopharyngeal hyperplasia. Despite dilation with a  52 French Maloney dilator the patient still continues to have dysphagia and the barium swallow was done after the dilation. Due to the patient having cricopharyngeal hyperplasia and her continued acid refluxing and regurgitation, the patient will be started on a trial of Dexilant. The patient has been explained the plan and agrees with it.    Lucilla Lame, MD. Marval Regal   Note: This dictation was prepared with Dragon dictation along with smaller phrase technology. Any transcriptional errors that result from this process are unintentional.

## 2017-03-26 NOTE — Progress Notes (Signed)
Results were reviewed and found to be: mildly abnormal  Initial impression would suggest no surgically correctable pathology  Review would suggest interventional pain management techniques may be of benefit

## 2017-04-06 ENCOUNTER — Telehealth: Payer: Self-pay | Admitting: Gastroenterology

## 2017-04-06 NOTE — Telephone Encounter (Signed)
Patient called and has been on  Dexilant for  3 weeks and her symptoms are the same. What else can she do?

## 2017-04-06 NOTE — Telephone Encounter (Signed)
Pt stated she is not having any improvement taking the dexilant. Still having the same symptoms as before. Please advise.

## 2017-04-08 ENCOUNTER — Other Ambulatory Visit: Payer: Self-pay | Admitting: Family Medicine

## 2017-04-09 NOTE — Telephone Encounter (Signed)
The patient shouldn't have this much heartburn on Dexilant. We need to see if the patient is having reflux. Please set her up for a pH probe study with impedance.

## 2017-04-12 NOTE — Telephone Encounter (Signed)
Tried contacting pt but number on file has been disconnected.

## 2017-04-19 ENCOUNTER — Other Ambulatory Visit: Payer: Self-pay

## 2017-04-19 NOTE — Telephone Encounter (Signed)
Tried numberous time to contact pt but phone was not working. Mailed letter requesting a call back if pt would like to schedule PH impedance.

## 2017-05-21 ENCOUNTER — Other Ambulatory Visit: Payer: Self-pay

## 2017-05-21 ENCOUNTER — Telehealth: Payer: Self-pay | Admitting: Gastroenterology

## 2017-05-21 ENCOUNTER — Encounter: Payer: Self-pay | Admitting: Nurse Practitioner

## 2017-05-21 ENCOUNTER — Ambulatory Visit: Payer: Medicare Other | Attending: Nurse Practitioner | Admitting: Nurse Practitioner

## 2017-05-21 VITALS — BP 144/100 | HR 59 | Temp 97.7°F | Resp 16 | Ht 64.0 in | Wt 155.0 lb

## 2017-05-21 DIAGNOSIS — Z87442 Personal history of urinary calculi: Secondary | ICD-10-CM | POA: Insufficient documentation

## 2017-05-21 DIAGNOSIS — M25512 Pain in left shoulder: Secondary | ICD-10-CM | POA: Insufficient documentation

## 2017-05-21 DIAGNOSIS — Z79899 Other long term (current) drug therapy: Secondary | ICD-10-CM | POA: Diagnosis not present

## 2017-05-21 DIAGNOSIS — M19012 Primary osteoarthritis, left shoulder: Secondary | ICD-10-CM | POA: Insufficient documentation

## 2017-05-21 DIAGNOSIS — R42 Dizziness and giddiness: Secondary | ICD-10-CM | POA: Insufficient documentation

## 2017-05-21 DIAGNOSIS — M19011 Primary osteoarthritis, right shoulder: Secondary | ICD-10-CM | POA: Insufficient documentation

## 2017-05-21 DIAGNOSIS — E871 Hypo-osmolality and hyponatremia: Secondary | ICD-10-CM | POA: Insufficient documentation

## 2017-05-21 DIAGNOSIS — R0609 Other forms of dyspnea: Secondary | ICD-10-CM | POA: Diagnosis not present

## 2017-05-21 DIAGNOSIS — R1310 Dysphagia, unspecified: Secondary | ICD-10-CM | POA: Diagnosis not present

## 2017-05-21 DIAGNOSIS — M47816 Spondylosis without myelopathy or radiculopathy, lumbar region: Secondary | ICD-10-CM | POA: Diagnosis not present

## 2017-05-21 DIAGNOSIS — M533 Sacrococcygeal disorders, not elsewhere classified: Secondary | ICD-10-CM | POA: Diagnosis not present

## 2017-05-21 DIAGNOSIS — M25511 Pain in right shoulder: Secondary | ICD-10-CM | POA: Insufficient documentation

## 2017-05-21 DIAGNOSIS — Z881 Allergy status to other antibiotic agents status: Secondary | ICD-10-CM | POA: Insufficient documentation

## 2017-05-21 DIAGNOSIS — Z888 Allergy status to other drugs, medicaments and biological substances status: Secondary | ICD-10-CM | POA: Insufficient documentation

## 2017-05-21 DIAGNOSIS — G894 Chronic pain syndrome: Secondary | ICD-10-CM | POA: Insufficient documentation

## 2017-05-21 DIAGNOSIS — D125 Benign neoplasm of sigmoid colon: Secondary | ICD-10-CM | POA: Diagnosis not present

## 2017-05-21 DIAGNOSIS — Z96641 Presence of right artificial hip joint: Secondary | ICD-10-CM | POA: Insufficient documentation

## 2017-05-21 DIAGNOSIS — M21371 Foot drop, right foot: Secondary | ICD-10-CM | POA: Insufficient documentation

## 2017-05-21 DIAGNOSIS — G8918 Other acute postprocedural pain: Secondary | ICD-10-CM | POA: Diagnosis not present

## 2017-05-21 DIAGNOSIS — Z885 Allergy status to narcotic agent status: Secondary | ICD-10-CM | POA: Insufficient documentation

## 2017-05-21 DIAGNOSIS — Z880 Allergy status to penicillin: Secondary | ICD-10-CM | POA: Insufficient documentation

## 2017-05-21 DIAGNOSIS — M47812 Spondylosis without myelopathy or radiculopathy, cervical region: Secondary | ICD-10-CM | POA: Diagnosis not present

## 2017-05-21 DIAGNOSIS — Z882 Allergy status to sulfonamides status: Secondary | ICD-10-CM | POA: Insufficient documentation

## 2017-05-21 DIAGNOSIS — Z79891 Long term (current) use of opiate analgesic: Secondary | ICD-10-CM | POA: Insufficient documentation

## 2017-05-21 DIAGNOSIS — F329 Major depressive disorder, single episode, unspecified: Secondary | ICD-10-CM | POA: Diagnosis not present

## 2017-05-21 DIAGNOSIS — M542 Cervicalgia: Secondary | ICD-10-CM | POA: Diagnosis not present

## 2017-05-21 DIAGNOSIS — M1610 Unilateral primary osteoarthritis, unspecified hip: Secondary | ICD-10-CM | POA: Insufficient documentation

## 2017-05-21 DIAGNOSIS — J45909 Unspecified asthma, uncomplicated: Secondary | ICD-10-CM | POA: Insufficient documentation

## 2017-05-21 DIAGNOSIS — M545 Low back pain: Secondary | ICD-10-CM | POA: Diagnosis not present

## 2017-05-21 DIAGNOSIS — Z9889 Other specified postprocedural states: Secondary | ICD-10-CM | POA: Insufficient documentation

## 2017-05-21 DIAGNOSIS — K589 Irritable bowel syndrome without diarrhea: Secondary | ICD-10-CM | POA: Insufficient documentation

## 2017-05-21 DIAGNOSIS — R51 Headache: Secondary | ICD-10-CM | POA: Insufficient documentation

## 2017-05-21 DIAGNOSIS — Z1211 Encounter for screening for malignant neoplasm of colon: Secondary | ICD-10-CM | POA: Insufficient documentation

## 2017-05-21 DIAGNOSIS — I10 Essential (primary) hypertension: Secondary | ICD-10-CM | POA: Insufficient documentation

## 2017-05-21 DIAGNOSIS — K219 Gastro-esophageal reflux disease without esophagitis: Secondary | ICD-10-CM | POA: Insufficient documentation

## 2017-05-21 MED ORDER — OXYCODONE HCL 5 MG PO TABS: 5.0000 mg | ORAL_TABLET | Freq: Four times a day (QID) | ORAL | 0 refills | Status: DC | PRN

## 2017-05-21 MED ORDER — OXYCODONE HCL 10 MG PO TABS: 10.0000 mg | ORAL_TABLET | Freq: Four times a day (QID) | ORAL | 0 refills | Status: DC | PRN

## 2017-05-21 NOTE — Progress Notes (Addendum)
Nursing Pain Medication Assessment:  Safety precautions to be maintained throughout the outpatient stay will include: orient to surroundings, keep bed in low position, maintain call bell within reach at all times, provide assistance with transfer out of bed and ambulation.  Medication Inspection Compliance: Pill count conducted under aseptic conditions, in front of the patient. Neither the pills nor the bottle was removed from the patient's sight at any time. Once count was completed pills were immediately returned to the patient in their original bottle.  Medication: Oxycodone IR Pill/Patch Count: 82 of 120 pills remain Pill/Patch Appearance: Markings consistent with prescribed medication Bottle Appearance: Standard pharmacy container. Clearly labeled. Filled Date: 03 / 02 / 2018 Last Medication intake:  Today

## 2017-05-21 NOTE — Progress Notes (Signed)
Patient's Name: [name]  MRN: [id number]  Referring Provider: Virginia Crews, MD  DOB: 1960/12/09  PCP: Virginia Crews, MD  DOS: 05/21/2017  Note by: Vevelyn Francois NP  Service setting: Ambulatory outpatient  Specialty: Interventional Pain Management  Location: [facility] (AMB) Pain Management Facility    Patient type: Established    Primary Reason(s) for Visit: Encounter for prescription drug management. (Level of risk: moderate)  CC: Neck Pain (lower)  HPI  [name] is a 57 y.o. year old, female patient, who comes today for a medication management evaluation. She has Chronic neck pain; Anxiety state; Asthma, mild; Blurred vision; Benign paroxysmal positional nystagmus; Congenital renal agenesis and dysgenesis; Clinical depression; Colon, diverticulosis; Fatigue; Cannot sleep; IBS (irritable bowel syndrome); Awareness of heartbeats; RAD (reactive airway disease); Apnea, sleep; Esophagitis, reflux; DOE (dyspnea on exertion); Chronic hip pain (Right); Encounter for therapeutic drug level monitoring; Long term current use of opiate analgesic; Long term prescription opiate use; Opiate use (30 MME/Day); Chronic pain syndrome; Steroid intolerance; Cervical spondylosis; Cervical facet arthropathy (Bilateral); History of cervical spinal surgery; Chronic shoulder pain (Secondary source of pain) (Bilateral) (L>R); 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 Her primarily concern today is the Neck Pain (lower)  Pain Assessment: Location: Left Neck Radiating: Denies Onset: More than a month ago Duration: Chronic pain Quality: Stabbing, Constant, Discomfort Severity: 5 /10 (self-reported pain score)  Note: Reported level is compatible with observation.                          Timing: Constant Modifying factors: heating pad, resting on back  [name] was last scheduled for an appointment on 02/26/2017 for medication management. During today's appointment we reviewed [name]'s chronic pain status, as well as her outpatient medication regimen. She is unable to take NSAIDs. She has ulcers was no Deilxant she is waiting to have GI study and on a streak diet. She admits that her shoulder pain is coming back in her neck area. She states that the with dishes and folding laundry that she gets the [employer]. She had to get this massaged out. She admits that she was on Oxycodone 21m in the past and asked to have this changed. She is concern that she is not able to tolerate her pain without use of this dose. She amdits that the ulcers is from the use of NSAIDs.   The patient  reports that she does not use drugs. Her body mass index is 26.61 kg/m.  Further details on both, my assessment(s), as well as the proposed treatment plan, please see below.  Controlled Substance Pharmacotherapy Assessment REMS (Risk Evaluation and Mitigation Strategy)  Analgesic:Oxycodone IR 5 mg every 6 hours (20 mg/day) MME/day:30 mg/day  Shelby Fischer, RN  05/21/2017 11:46 AM  Sign at  close encounter Nursing Pain Medication Assessment:  Safety precautions to be maintained throughout the outpatient stay will include: orient to surroundings, keep bed in low position, maintain call bell within reach at all times, provide assistance with transfer out of bed and ambulation.  Medication Inspection Compliance: Pill count conducted under aseptic conditions, in front of the patient. Neither the pills nor the bottle was removed from the patient's sight at any time. Once count was completed pills were immediately returned to the patient in their original bottle.  Medication: Morphine IR Pill/Patch Count: 82 of 120 pills remain Pill/Patch Appearance: Markings consistent with prescribed medication Bottle Appearance: Standard pharmacy container. Clearly labeled. Filled Date: 03 / 02 / 2018 Last Medication intake:  Today   Pharmacokinetics: Liberation and absorption (onset of action): WNL Distribution (time to peak effect): WNL Metabolism and excretion (duration of action): WNL         Pharmacodynamics: Desired effects: Analgesia: [name] reports >50% benefit. Functional ability: Patient reports that medication allows her to accomplish basic ADLs Clinically meaningful improvement in function (CMIF): Sustained CMIF goals met Perceived effectiveness: Described as relatively effective, allowing for increase in activities of daily living (ADL) Undesirable effects: Side-effects or Adverse reactions: None reported Monitoring: [place] PMP: Online review of the past 40-monthperiod conducted. Compliant with practice rules and regulations Last UDS on record: Summary  Date Value Ref Range Status  07/13/2016 FINAL  Final    Comment:    ==================================================================== TOXASSURE SELECT 13 (MW) ==================================================================== Test                             Result       Flag       Units Drug Present and Declared for  Prescription Verification   Alprazolam                     287          EXPECTED   ng/mg creat   Alpha-hydroxyalprazolam        287          EXPECTED   ng/mg creat    Source of alprazolam is a scheduled prescription medication.    Alpha-hydroxyalprazolam is an expected metabolite of alprazolam. Drug Absent but Declared for Prescription Verification   Oxycodone                      Not Detected UNEXPECTED ng/mg creat ==================================================================== Test                      Result    Flag   Units      Ref Range   Creatinine              78               mg/dL      >=20 ==================================================================== Declared Medications:  The flagging and interpretation on this report are based on the  following declared medications.  Unexpected results may arise from  inaccuracies in the declared medications.  **Note: The testing scope of this panel includes these medications:  Alprazolam  Alprazolam (Xanax)  Oxycodone  **Note: The testing scope of this panel does not include following  reported medications:  Bupropion (Wellbutrin)  Citalopram (Lexapro)  Fluticasone  Loratadine  Metoprolol (Toprol)  Montelukast (Singulair)  Omeprazole (Prilosec)  Ondansetron  Ondansetron (Zofran)  Valacyclovir ==================================================================== For clinical consultation, please call [phone]. ====================================================================    UDS interpretation: Compliant          Medication Assessment Form: Reviewed. Patient indicates being compliant with therapy Treatment compliance: Compliant Risk Assessment Profile: Aberrant behavior: See prior evaluations. None observed or detected today Comorbid factors increasing risk of overdose: See prior notes. No additional risks detected today Risk of substance use disorder (SUD): Low  ORT Scoring interpretation table:  Score  <3 = Low Risk for SUD  Score between 4-7 = Moderate Risk for SUD  Score >8 = High Risk for Opioid Abuse   Risk Mitigation Strategies:  Patient Counseling: Covered Patient-Prescriber Agreement (PPA): Present and active  Notification to other healthcare providers: Done  Pharmacologic Plan: No change in therapy, at this time.             Laboratory Chemistry  Inflammation Markers (CRP: Acute Phase) (ESR: Chronic Phase) Lab Results  Component Value Date   CRP 0.5 04/05/2015   ESRSEDRATE 37 (H) 04/05/2015                         Rheumatology Markers No results found for: Elayne Guerin, [facility]              Renal Function Markers Lab Results  Component Value Date   BUN 11 12/07/2016   CREATININE 0.85 12/07/2016   GFRAA 89 01/27/2016   GFRNONAA 77 01/27/2016                 Hepatic Function Markers Lab Results  Component Value Date   AST 15 12/07/2016   ALT 9 12/07/2016   ALBUMIN 4.3 01/27/2016   ALKPHOS 118 (H) 01/27/2016   LIPASE 23 04/06/2015                 Electrolytes Lab Results  Component Value Date   NA 139 12/07/2016   K 4.4 12/07/2016   CL 102 12/07/2016   CALCIUM 9.5 12/07/2016   MG 2.1 04/05/2015                        Neuropathy Markers Lab Results  Component Value Date   HGBA1C 5.1 01/27/2016   HIV NON-REACTIVE 12/07/2016                 Bone Pathology Markers No results found for: VD25OH, GD924QA8TMH, [id number], XQ1194RD4, 25OHVITD1, 25OHVITD2, 25OHVITD3, TESTOFREE, TESTOSTERONE                       Coagulation Parameters Lab Results  Component Value Date   INR 1.05 03/06/2015   LABPROT 13.9 03/06/2015   APTT 33 03/06/2015   PLT 381 12/07/2016                 Cardiovascular Markers Lab Results  Component Value Date   TROPONINI <0.03 03/06/2015   HGB 12.3 12/07/2016   HCT 37.5 12/07/2016                 CA Markers No results found for: CEA, CA125, LABCA2               Note: Lab results  reviewed.  Recent Diagnostic Imaging Results  MR SHOULDER LEFT WO CONTRAST CLINICAL DATA:  Left shoulder pain and weakness for 4 months.  EXAM: MRI OF THE LEFT SHOULDER WITHOUT CONTRAST  TECHNIQUE: Multiplanar, multisequence MR  imaging of the shoulder was performed. No intravenous contrast was administered.  COMPARISON:  None.  FINDINGS: Examination is somewhat limited by patient motion.  Rotator cuff: Mild to moderate rotator cuff tendinopathy/tendinosis but no partial or full-thickness tear.  Muscles: Edema like signal abnormality in the supraspinatus muscle could be a muscle strain, partial tear or denervation process. The other muscles appear normal.  Biceps long head:  Intact  Acromioclavicular Joint: Mild degenerative changes type 1-2 acromion. No lateral downsloping or undersurface spurring.  Glenohumeral Joint: No degenerative changes or joint effusion. There is thickening of the capsular structures in the axillary recess which can be seen with adhesive capsulitis or synovitis.  Labrum:  No obvious labral tears.  Bones:  No acute bony findings.  Other: Mild subacromial/subdeltoid bursitis.  IMPRESSION: 1. Mild to moderate rotator cuff tendinopathy/tendinosis but no partial or full-thickness tear. 2. Intact long head biceps tendon and glenoid labrum. 3. Mild edema like signal abnormality in the supraspinatus muscle could be due to a muscle strain, partial tear or denervation process. 4. No significant findings for bony impingement. 5. Mild subacromial/subdeltoid bursitis.  Electronically Signed   By: Marijo Sanes M.D.   On: 03/20/2017 11:04  Complexity Note: Imaging results reviewed. Results shared with [name], using Layman's terms.                         Meds   Current Outpatient Medications:  .  albuterol (PROVENTIL HFA) 108 (90 Base) MCG/ACT inhaler, Inhale into the lungs., Disp: , Rfl:  .  ALPRAZolam (XANAX) 1 MG tablet, TAKE 1/2 TAB BY  MOUTH EVERY MORNING, 1/2 TAB AT LUNCH, AND 2 TABS AT BEDTIME, Disp: 90 tablet, Rfl: 5 .  buPROPion (WELLBUTRIN XL) 300 MG 24 hr tablet, TAKE 1 TABLET BY MOUTH EVERY DAY, Disp: 30 tablet, Rfl: 3 .  Docusate Sodium (COLACE PO), Take by mouth 2 (two) times daily., Disp: , Rfl:  .  escitalopram (LEXAPRO) 20 MG tablet, Take 1 tablet (20 mg total) by mouth at bedtime., Disp: 90 tablet, Rfl: 3 .  metoprolol succinate (TOPROL-XL) 50 MG 24 hr tablet, TAKE 1 TABLET BY MOUTH EVERY DAY, Disp: 90 tablet, Rfl: 1 .  montelukast (SINGULAIR) 10 MG tablet, TAKE 1 TABLET (10 MG TOTAL) BY MOUTH DAILY., Disp: 90 tablet, Rfl: 1 .  Spacer/Aero-Holding Chambers (AEROCHAMBER MV) inhaler, by Does not apply route., Disp: , Rfl:  .  valACYclovir (VALTREX) 1000 MG tablet, TAKE TWO TABLETS BY MOUTH TWICE DAILY FOR ONE DAY FEVER BLISTER, Disp: 4 tablet, Rfl: 5 .  [START ON 08/10/2017] oxyCODONE 10 MG TABS, Take 1 tablet (10 mg total) by mouth every 6 (six) hours as needed for severe pain., Disp: 120 tablet, Rfl: 0 .  [START ON 07/11/2017] oxyCODONE 10 MG TABS, Take 1 tablet (10 mg total) by mouth every 6 (six) hours as needed for severe pain., Disp: 120 tablet, Rfl: 0 .  [START ON 06/11/2017] oxyCODONE 10 MG TABS, Take 1 tablet (10 mg total) by mouth every 6 (six) hours as needed for severe pain., Disp: 120 tablet, Rfl: 0  ROS  Constitutional: Denies any fever or chills Gastrointestinal: No reported hemesis, hematochezia, vomiting, or acute GI distress Musculoskeletal: Denies any acute onset joint swelling, redness, loss of ROM, or weakness Neurological: No reported episodes of acute onset apraxia, aphasia, dysarthria, agnosia, amnesia, paralysis, loss of coordination, or loss of consciousness  Allergies  Ms. Balow is allergic to amoxicillin; chlorhexidine gluconate; clindamycin; codeine; erythromycin;  penicillin g; sulfa antibiotics; shellfish allergy; decadron [dexamethasone]; mangifera indica; papaya derivatives; betadine  [povidone iodine]; chlorhexidine; clarithromycin; clindamycin/lincomycin; other; povidone-iodine; and prednisone.  [place]  Drug: Ms. Bier  reports that she does not use drugs. Alcohol:  reports that she does not drink alcohol. Tobacco:  reports that  has never smoked. she has never used smokeless tobacco. 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), Anemia, Anxiety, Asthma, Cephalalgia (08/18/2014), Cervical disc disease (08/18/2014), Chronic headaches, DDD (degenerative disc disease), Depression, Dizziness (04/22/2013), Duodenal ulcer with hemorrhage and perforation ([place]) (04/27/2003), GERD (gastroesophageal reflux disease), H/O arthrodesis (08/18/2014), Headache(784.0), History of blood transfusion, History of cervical spinal surgery (01/04/2015), History of kidney stones, Hypertension, Inverted T wave, Narrowing of intervertebral disc space (08/18/2014), Orthostatic hypotension (04/22/2013), Pain, Pneumonia, PONV (postoperative nausea and vomiting), Postop Hyponatremia (05/14/2012), Postoperative anemia due to acute blood loss (05/14/2012), Right foot drop, and Right hip arthralgia (08/18/2014). Surgical: Ms. Behring  has a past surgical history that includes RIGHT HIP ARTHROSCOPY FOR LABRAL TEAR (ABOUT 2010); Anterior fusion cervical spine (MAY 2012); Nasal septum surgery (MARCH 2013); Back surgery (2009); Tonsillectomy; Abdominal hysterectomy; DIAGNOSTIC LAPAROSCOPIES - MULTIPLE FOR ENDOMETRIOSIS; Cholecystectomy; Hip arthroscopy (09/20/2011); Shoulder arthroscopy (05-06-12); Carpal tunnel release (05-06-12); Total hip arthroplasty (Right, 05/13/2012); Anterior cervical decomp/discectomy fusion (N/A, 10/30/2012); Posterior cervical fusion/foraminotomy (N/A, 04/16/2013); Breast biopsy (Right, 2008); Breast excisional biopsy (Left, 1998); Breast reduction surgery (Bilateral, 06/2016); Colonoscopy with propofol (N/A, 01/26/2017); Esophagogastroduodenoscopy (egd) with propofol (N/A, 01/26/2017);  Esophageal dilation (N/A, 01/26/2017); and polypectomy (N/A, 01/26/2017). Family: family history includes Aneurysm in her maternal grandmother and mother; Breast cancer (age of onset: 66) in her paternal grandmother.  Constitutional Exam  General appearance: Well nourished, well developed, and well hydrated. In no apparent acute distress Vitals:   05/21/17 1123  BP: (!) 144/100  Pulse: (!) 59  Resp: 16  Temp: 97.7 F (36.5 C)  TempSrc: Oral  SpO2: 100%  Weight: 155 lb (70.3 kg)  Height: 5' 4" (1.626 m)   BMI Assessment: Estimated body mass index is 26.61 kg/m as calculated from the following:   Height as of this encounter: 5' 4" (1.626 m).   Weight as of this encounter: 155 lb (70.3 kg). Psych/Mental status: Alert, oriented x 3 (person, place, & time)       Eyes: PERLA Respiratory: No evidence of acute respiratory distress  Cervical Spine Area Exam  Skin & Axial Inspection: No masses, redness, edema, swelling, or associated skin lesions Alignment: Symmetrical Functional ROM: Unrestricted ROM      Stability: No instability detected Muscle Tone/Strength: Functionally intact. No obvious neuro-muscular anomalies detected. Sensory (Neurological): Unimpaired Palpation: Complains of area being tender to palpation              Upper Extremity (UE) Exam    Side: Right upper extremity  Side: Left upper extremity  Skin & Extremity Inspection: Skin color, temperature, and hair growth are WNL. No peripheral edema or cyanosis. No masses, redness, swelling, asymmetry, or associated skin lesions. No contractures.  Skin & Extremity Inspection: Skin color, temperature, and hair growth are WNL. No peripheral edema or cyanosis. No masses, redness, swelling, asymmetry, or associated skin lesions. No contractures.  Functional ROM: Unrestricted ROM          Functional ROM: Adequate ROM          Muscle Tone/Strength: Functionally intact. No obvious neuro-muscular anomalies detected.  Muscle  Tone/Strength: Functionally intact. No obvious neuro-muscular anomalies detected.  Sensory (Neurological): Unimpaired  Sensory (Neurological): Unimpaired          Palpation: No palpable anomalies              Palpation: No palpable anomalies              Specialized Test(s): Deferred         Specialized Test(s): Deferred          Thoracic Spine Area Exam  Skin & Axial Inspection: No masses, redness, or swelling Alignment: Symmetrical Functional ROM: Unrestricted ROM Stability: No instability detected Muscle Tone/Strength: Functionally intact. No obvious neuro-muscular anomalies detected. Sensory (Neurological): Unimpaired Muscle strength & Tone: No palpable anomalies  Lumbar Spine Area Exam  Skin & Axial Inspection: No masses, redness, or swelling Alignment: Symmetrical Functional ROM: Unrestricted ROM      Stability: No instability detected Muscle Tone/Strength: Functionally intact. No obvious neuro-muscular anomalies detected. Sensory (Neurological): Unimpaired Palpation: No palpable anomalies       Provocative Tests: Lumbar Hyperextension and rotation test: evaluation deferred today       Lumbar Lateral bending test: evaluation deferred today       Patrick's Maneuver: evaluation deferred today                    Gait & Posture Assessment  Ambulation: Unassisted Gait: Relatively normal for age and body habitus Posture: WNL   Lower Extremity Exam    Side: Right lower extremity  Side: Left lower extremity  Skin & Extremity Inspection: Skin color, temperature, and hair growth are WNL. No peripheral edema or cyanosis. No masses, redness, swelling, asymmetry, or associated skin lesions. No contractures.  Skin & Extremity Inspection: Skin color, temperature, and hair growth are WNL. No peripheral edema or cyanosis. No masses, redness, swelling, asymmetry, or associated skin lesions. No contractures.  Functional ROM: Unrestricted ROM          Functional ROM: Unrestricted ROM           Muscle Tone/Strength: Functionally intact. No obvious neuro-muscular anomalies detected.  Muscle Tone/Strength: Functionally intact. No obvious neuro-muscular anomalies detected.  Sensory (Neurological): Unimpaired  Sensory (Neurological): Unimpaired  Palpation: No palpable anomalies  Palpation: No palpable anomalies   Assessment  Primary Diagnosis & Pertinent Problem List: The primary encounter diagnosis was Cervical spondylosis. 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  Status Diagnosis  Controlled Controlled Controlled 1. Cervical spondylosis   2. 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 Lumbar spondylosis   4. Chronic pain syndrome   5. Long term current use of opiate analgesic     Problems updated and reviewed during this visit: No problems updated. Plan of Care  Pharmacotherapy (Medications Ordered): Meds ordered this encounter  Medications  . DISCONTD: oxyCODONE (OXY IR/ROXICODONE) 5 MG immediate release tablet    Sig: Take 1 tablet (5 mg total) by mouth every 6 (six) hours as needed for severe pain.    Dispense:  120 tablet    Refill:  0    Fill one day early if pharmacy is closed on scheduled refill date. Do not fill until:08/10/2017 To last until: 09/09/2017    Order Specific Question:   Supervising Provider    Answer:   Milinda Pointer [phone]  . DISCONTD: oxyCODONE (OXY IR/ROXICODONE) 5 MG immediate release tablet    Sig: Take 1 tablet (5 mg total) by mouth every 6 (six) hours as needed for  severe pain.    Dispense:  120 tablet    Refill:  0    Fill one day early if pharmacy is closed on scheduled refill date. Do not fill until: 07/11/2017 To last until:08/10/2017    Order Specific Question:   Supervising Provider    Answer:   Milinda Pointer [phone]  . DISCONTD: oxyCODONE (OXY IR/ROXICODONE) 5 MG immediate  release tablet    Sig: Take 1 tablet (5 mg total) by mouth every 6 (six) hours as needed for severe pain.    Dispense:  120 tablet    Refill:  0    DO NOT DELETE, even if Expired!!! See Dr. Adalberto Cole care coordination note. Fill one day early if pharmacy is closed on scheduled refill date. Do not fill until: 06/11/2017 To last until:07/11/2017    Order Specific Question:   Supervising Provider    Answer:   Milinda Pointer [phone]  . oxyCODONE 10 MG TABS    Sig: Take 1 tablet (10 mg total) by mouth every 6 (six) hours as needed for severe pain.    Dispense:  120 tablet    Refill:  0    Fill one day early if pharmacy is closed on scheduled refill date. Do not fill until:08/10/2017 To last until: 09/09/2017    Order Specific Question:   Supervising Provider    Answer:   Milinda Pointer [phone]  . oxyCODONE 10 MG TABS    Sig: Take 1 tablet (10 mg total) by mouth every 6 (six) hours as needed for severe pain.    Dispense:  120 tablet    Refill:  0    Fill one day early if pharmacy is closed on scheduled refill date. Do not fill until: 07/11/2017 To last until:08/10/2017    Order Specific Question:   Supervising Provider    Answer:   Milinda Pointer [phone]  . oxyCODONE 10 MG TABS    Sig: Take 1 tablet (10 mg total) by mouth every 6 (six) hours as needed for severe pain.    Dispense:  120 tablet    Refill:  0    DO NOT DELETE, even if Expired!!! See Dr. Adalberto Cole care coordination note. Fill one day early if pharmacy is closed on scheduled refill date. Do not fill until: 06/11/2017 To last until:07/11/2017    Order Specific Question:   Supervising Provider    Answer:   Milinda Pointer [phone]   New Prescriptions   No medications on file   Medications administered today: Kadie Balestrieri. Jeanbaptiste had no medications administered during this visit. Lab-work, procedure(s), and/or referral(s): Orders Placed This Encounter  Procedures  . ToxASSURE Select 13 (MW), Urine   Imaging and/or  referral(s): None  Interventional management options: Planned, scheduled, and/or pending: Not a this time   Considering: Palliativebilateral suprascapular nerve block Possible bilateral suprascapular nerve RFA   Palliative PRN treatment(s): None at this time      Provider-requested follow-up: Return in about 3 months (around 08/21/2017) for MedMgmt with Me Donella Stade Edison Pace).  Future Appointments  Date Time Provider [place]  06/14/2017 11:00 AM Virginia Crews, MD [facility] None  08/21/2017 10:30 AM Vevelyn Francois, NP [facility] None  12/10/2017  9:30 AM [facility]-NURSE HEALTH ADVISOR [facility] None  12/10/2017 10:00 AM Bacigalupo, Dionne Bucy, MD [facility] None   Primary Care Physician: Virginia Crews, MD Location: [facility] Note by: Vevelyn Francois NP Date: 05/21/2017; Time: 2:20 PM  Pain Score Disclaimer: We use the NRS-11  scale. This is a self-reported, subjective measurement of pain severity with only modest accuracy. It is used primarily to identify changes within a particular patient. It must be understood that outpatient pain scales are significantly less accurate that those used for research, where they can be applied under ideal controlled circumstances with minimal exposure to variables. In reality, the score is likely to be a combination of pain intensity and pain affect, where pain affect describes the degree of emotional arousal or changes in action readiness caused by the sensory experience of pain. Factors such as social and work situation, setting, emotional state, anxiety levels, expectation, and prior pain experience may influence pain perception and show large inter-individual differences that may also be affected by time variables.  Patient instructions provided during this appointment: Patient Instructions  ____________________________________________________________________________________________  Medication  Rules  Applies to: All patients receiving prescriptions (written or electronic).  Pharmacy of record: Pharmacy where electronic prescriptions will be sent. If written prescriptions are taken to a different pharmacy, please inform the nursing staff. The pharmacy listed in the electronic medical record should be the one where you would like electronic prescriptions to be sent.  Prescription refills: Only during scheduled appointments. Applies to both, written and electronic prescriptions.  NOTE: The following applies primarily to controlled substances (Opioid* Pain Medications).   Patient's responsibilities: 1. Pain Pills: Bring all pain pills to every appointment (except for procedure appointments). 2. Pill Bottles: Bring pills in original pharmacy bottle. Always bring newest bottle. Bring bottle, even if empty. 3. Medication refills: You are responsible for knowing and keeping track of what medications you need refilled. The day before your appointment, write a list of all prescriptions that need to be refilled. Bring that list to your appointment and give it to the admitting nurse. Prescriptions will be written only during appointments. If you forget a medication, it will not be "Called in", "Faxed", or "electronically sent". You will need to get another appointment to get these prescribed. 4. Prescription Accuracy: You are responsible for carefully inspecting your prescriptions before leaving our office. Have the discharge nurse carefully go over each prescription with you, before taking them home. Make sure that your name is accurately spelled, that your address is correct. Check the name and dose of your medication to make sure it is accurate. Check the number of pills, and the written instructions to make sure they are clear and accurate. Make sure that you are given enough medication to last until your next medication refill appointment. 5. Taking Medication: Take medication as prescribed. Never  take more pills than instructed. Never take medication more frequently than prescribed. Taking less pills or less frequently is permitted and encouraged, when it comes to controlled substances (written prescriptions).  6. Inform other Doctors: Always inform, all of your healthcare providers, of all the medications you take. 7. Pain Medication from other Providers: You are not allowed to accept any additional pain medication from any other Doctor or Healthcare provider. There are two exceptions to this rule. (see below) In the event that you require additional pain medication, you are responsible for notifying us, as stated below. 8. Medication Agreement: You are responsible for carefully reading and following our Medication Agreement. This must be signed before receiving any prescriptions from our practice. Safely store a copy of your signed Agreement. Violations to the Agreement will result in no further prescriptions. (Additional copies of our Medication Agreement are available upon request.) 9. Laws, Rules, & Regulations: All patients are expected to follow  all Federal and [employer], Rules, & Regulations. Ignorance of the Laws does not constitute a valid excuse. The use of any illegal substances is prohibited. 10. Adopted CDC guidelines & recommendations: Target dosing levels will be at or below 60 MME/day. Use of benzodiazepines** is not recommended.  Exceptions: There are only two exceptions to the rule of not receiving pain medications from other Healthcare Providers. 1. Exception #1 (Emergencies): In the event of an emergency (i.e.: accident requiring emergency care), you are allowed to receive additional pain medication. However, you are responsible for: As soon as you are able, call our office [phone], at any time of the day or night, and leave a message stating your name, the date and nature of the emergency, and the name and dose of the medication prescribed. In the event that  your call is answered by a member of our staff, make sure to document and save the date, time, and the name of the person that took your information.  2. Exception #2 (Planned Surgery): In the event that you are scheduled by another doctor or dentist to have any type of surgery or procedure, you are allowed (for a period no longer than 30 days), to receive additional pain medication, for the acute post-op pain. However, in this case, you are responsible for picking up a copy of our "Post-op Pain Management for Surgeons" handout, and giving it to your surgeon or dentist. This document is available at our office, and does not require an appointment to obtain it. Simply go to our office during business hours (Monday-Thursday from 8:00 AM to 4:00 PM) (Friday 8:00 AM to 12:00 Noon) or if you have a scheduled appointment with [place], prior to your surgery, and ask for it by name. In addition, you will need to provide [place] with your name, name of your surgeon, type of surgery, and date of procedure or surgery.  *Opioid medications include: morphine, codeine, oxycodone, oxymorphone, hydrocodone, hydromorphone, meperidine, tramadol, tapentadol, buprenorphine, fentanyl, methadone. **Benzodiazepine medications include: diazepam (Valium), alprazolam (Xanax), clonazepam (Klonopine), lorazepam (Ativan), clorazepate (Tranxene), chlordiazepoxide (Librium), estazolam (Prosom), oxazepam (Serax), temazepam (Restoril), triazolam (Halcion) (Last updated: 05/10/2017) ____________________________________________________________________________________________

## 2017-05-21 NOTE — Telephone Encounter (Signed)
pt is calling to set up apt  For a tube in her nose to check acid in her stomach  Please call pt  At [phone]  She also needs to know if she is  supposed to be on rx?? Something starting with a D?

## 2017-05-21 NOTE — Patient Instructions (Signed)
____________________________________________________________________________________________  Medication Rules  Applies to: All patients receiving prescriptions (written or electronic).  Pharmacy of record: Pharmacy where electronic prescriptions will be sent. If written prescriptions are taken to a different pharmacy, please inform the nursing staff. The pharmacy listed in the electronic medical record should be the one where you would like electronic prescriptions to be sent.  Prescription refills: Only during scheduled appointments. Applies to both, written and electronic prescriptions.  NOTE: The following applies primarily to controlled substances (Opioid* Pain Medications).   Patient's responsibilities: 1. Pain Pills: Bring all pain pills to every appointment (except for procedure appointments). 2. Pill Bottles: Bring pills in original pharmacy bottle. Always bring newest bottle. Bring bottle, even if empty. 3. Medication refills: You are responsible for knowing and keeping track of what medications you need refilled. The day before your appointment, write a list of all prescriptions that need to be refilled. Bring that list to your appointment and give it to the admitting nurse. Prescriptions will be written only during appointments. If you forget a medication, it will not be "Called in", "Faxed", or "electronically sent". You will need to get another appointment to get these prescribed. 4. Prescription Accuracy: You are responsible for carefully inspecting your prescriptions before leaving our office. Have the discharge nurse carefully go over each prescription with you, before taking them home. Make sure that your name is accurately spelled, that your address is correct. Check the name and dose of your medication to make sure it is accurate. Check the number of pills, and the written instructions to make sure they are clear and accurate. Make sure that you are given enough medication to last  until your next medication refill appointment. 5. Taking Medication: Take medication as prescribed. Never take more pills than instructed. Never take medication more frequently than prescribed. Taking less pills or less frequently is permitted and encouraged, when it comes to controlled substances (written prescriptions).  6. Inform other Doctors: Always inform, all of your healthcare providers, of all the medications you take. 7. Pain Medication from other Providers: You are not allowed to accept any additional pain medication from any other Doctor or Healthcare provider. There are two exceptions to this rule. (see below) In the event that you require additional pain medication, you are responsible for notifying us, as stated below. 8. Medication Agreement: You are responsible for carefully reading and following our Medication Agreement. This must be signed before receiving any prescriptions from our practice. Safely store a copy of your signed Agreement. Violations to the Agreement will result in no further prescriptions. (Additional copies of our Medication Agreement are available upon request.) 9. Laws, Rules, & Regulations: All patients are expected to follow all Federal and State Laws, Statutes, Rules, & Regulations. Ignorance of the Laws does not constitute a valid excuse. The use of any illegal substances is prohibited. 10. Adopted CDC guidelines & recommendations: Target dosing levels will be at or below 60 MME/day. Use of benzodiazepines** is not recommended.  Exceptions: There are only two exceptions to the rule of not receiving pain medications from other Healthcare Providers. 1. Exception #1 (Emergencies): In the event of an emergency (i.e.: accident requiring emergency care), you are allowed to receive additional pain medication. However, you are responsible for: As soon as you are able, call our office [phone], at any time of the day or night, and leave a message stating your name, the  date and nature of the emergency, and the name and dose of the medication   prescribed. In the event that your call is answered by a member of our staff, make sure to document and save the date, time, and the name of the person that took your information.  2. Exception #2 (Planned Surgery): In the event that you are scheduled by another doctor or dentist to have any type of surgery or procedure, you are allowed (for a period no longer than 30 days), to receive additional pain medication, for the acute post-op pain. However, in this case, you are responsible for picking up a copy of our "Post-op Pain Management for Surgeons" handout, and giving it to your surgeon or dentist. This document is available at our office, and does not require an appointment to obtain it. Simply go to our office during business hours (Monday-Thursday from 8:00 AM to 4:00 PM) (Friday 8:00 AM to 12:00 Noon) or if you have a scheduled appointment with us, prior to your surgery, and ask for it by name. In addition, you will need to provide us with your name, name of your surgeon, type of surgery, and date of procedure or surgery.  *Opioid medications include: morphine, codeine, oxycodone, oxymorphone, hydrocodone, hydromorphone, meperidine, tramadol, tapentadol, buprenorphine, fentanyl, methadone. **Benzodiazepine medications include: diazepam (Valium), alprazolam (Xanax), clonazepam (Klonopine), lorazepam (Ativan), clorazepate (Tranxene), chlordiazepoxide (Librium), estazolam (Prosom), oxazepam (Serax), temazepam (Restoril), triazolam (Halcion) (Last updated: 05/10/2017) ____________________________________________________________________________________________    

## 2017-05-22 ENCOUNTER — Other Ambulatory Visit: Payer: Self-pay

## 2017-05-22 DIAGNOSIS — R12 Heartburn: Secondary | ICD-10-CM

## 2017-05-22 NOTE — Telephone Encounter (Signed)
Left vm for pt to return my call.  

## 2017-05-23 NOTE — Telephone Encounter (Signed)
Pt notified Doran study. Scheduled at [facility] on 05/30/17.

## 2017-05-24 ENCOUNTER — Encounter: Payer: Medicare Other | Admitting: Nurse Practitioner

## 2017-05-25 LAB — TOXASSURE SELECT 13 (MW), URINE

## 2017-05-30 ENCOUNTER — Encounter
Admission: RE | Disposition: A | Discharge status: Home / Self Care | Payer: Self-pay | Source: Ambulatory Visit | Attending: Gastroenterology

## 2017-05-30 ENCOUNTER — Ambulatory Visit
Admission: RE | Admit: 2017-05-30 | Discharge: 2017-05-30 | Disposition: A | Discharge status: Home / Self Care | Payer: Medicare Other | Source: Ambulatory Visit | Attending: Gastroenterology | Admitting: Gastroenterology

## 2017-05-30 DIAGNOSIS — R12 Heartburn: Secondary | ICD-10-CM | POA: Diagnosis not present

## 2017-05-30 DIAGNOSIS — R1013 Epigastric pain: Secondary | ICD-10-CM | POA: Diagnosis not present

## 2017-05-30 DIAGNOSIS — R079 Chest pain, unspecified: Secondary | ICD-10-CM | POA: Diagnosis not present

## 2017-05-30 HISTORY — PX: PH IMPEDANCE STUDY: SHX5565

## 2017-05-30 HISTORY — PX: ESOPHAGEAL MANOMETRY: SHX5429

## 2017-05-30 SURGERY — IMPEDANCE PH STUDY, ESOPHAGUS

## 2017-05-30 MED ORDER — LIDOCAINE HCL 2 % EX GEL
1.0000 "application " | Freq: Once | CUTANEOUS | Status: AC
  Administered 2017-05-30: 1 via TOPICAL

## 2017-05-30 MED ORDER — BUTAMBEN-TETRACAINE-BENZOCAINE 2-2-14 % EX AERO
2.0000 | INHALATION_SPRAY | Freq: Once | CUTANEOUS | Status: AC
  Administered 2017-05-30: 2 via TOPICAL

## 2017-05-30 SURGICAL SUPPLY — 2 items
FACESHIELD LNG OPTICON STERILE (SAFETY) IMPLANT
GLOVE BIO SURGEON STRL SZ8 (GLOVE) ×6 IMPLANT

## 2017-06-01 ENCOUNTER — Encounter: Payer: Self-pay | Admitting: Gastroenterology

## 2017-06-07 ENCOUNTER — Other Ambulatory Visit: Payer: Self-pay | Admitting: Physician Assistant

## 2017-06-07 ENCOUNTER — Telehealth: Payer: Self-pay | Admitting: Gastroenterology

## 2017-06-07 DIAGNOSIS — J45909 Unspecified asthma, uncomplicated: Secondary | ICD-10-CM

## 2017-06-07 NOTE — Telephone Encounter (Signed)
Pt left vm to Speak to Ginger, I informed pt per Ginger Dr. Allen Norris has been out the office and is aware to review results on Tuesday.  Pt is aware

## 2017-06-07 NOTE — Telephone Encounter (Signed)
Pt is calling to check on results please call pt at [phone]

## 2017-06-12 NOTE — Telephone Encounter (Signed)
Pt left vm in regards to acid reflux results she states she has chest pain and has problems when laying down she has court tomorrow morning so call around 2.

## 2017-06-13 NOTE — Telephone Encounter (Signed)
Please review PH study/manometry results.

## 2017-06-13 NOTE — Telephone Encounter (Signed)
Pt is calling for results from [facility] she would like to know what she can take for stomach pain please call

## 2017-06-14 ENCOUNTER — Ambulatory Visit: Payer: Self-pay | Admitting: Family Medicine

## 2017-06-14 NOTE — Progress Notes (Deleted)
Patient: [name] Female    DOB: 1960/09/26   57 y.o.   MRN: [id number] Visit Date: 06/14/2017  Today's Provider: Lavon Paganini, MD   I, Martha Clan, CMA, am acting as scribe for Lavon Paganini, MD.  No chief complaint on file.  Subjective:    Anxiety  Presents for follow-up (LOV 3 months ago. Pt was referred to psych to manage benzos due to pt's chronic opioid use, which is F/B pain management) visit.         Allergies  Allergen Reactions  . Amoxicillin Hives  . Chlorhexidine Gluconate Dermatitis and Hives  . Clindamycin Hives  . Codeine Hives  . Erythromycin Hives    "mycins" in general  . Penicillin G Hives    "cillins" in general  . Sulfa Antibiotics Nausea And Vomiting and Hives       . Shellfish Allergy Hives  . Decadron [Dexamethasone] Other (See Comments)    Hot flashes, insomnia, "manic" Hot flashes, insomnia, "manic"  . Mangifera Indica Hives    papaya  . Papaya Derivatives Hives  . Betadine [Povidone Iodine] Hives       . Chlorhexidine Hives       . Clarithromycin Hives  . Clindamycin/Lincomycin Hives  . Other Hives     Mango   . Povidone-Iodine Hives  . Prednisone Anxiety    High blood pressure, flushed, mood changes, heart palpitations High blood pressure, flushed, mood changes, heart palpitations     Current Outpatient Medications:  .  albuterol (PROVENTIL HFA) 108 (90 Base) MCG/ACT inhaler, Inhale into the lungs., Disp: , Rfl:  .  ALPRAZolam (XANAX) 1 MG tablet, TAKE 1/2 TAB BY MOUTH EVERY MORNING, 1/2 TAB AT LUNCH, AND 2 TABS AT BEDTIME, Disp: 90 tablet, Rfl: 5 .  buPROPion (WELLBUTRIN XL) 300 MG 24 hr tablet, TAKE 1 TABLET BY MOUTH EVERY DAY, Disp: 30 tablet, Rfl: 3 .  Docusate Sodium (COLACE PO), Take by mouth 2 (two) times daily., Disp: , Rfl:  .  escitalopram (LEXAPRO) 20 MG tablet, Take 1 tablet (20 mg total) by mouth at bedtime., Disp: 90 tablet, Rfl: 3 .  metoprolol succinate (TOPROL-XL) 50 MG 24 hr tablet,  TAKE 1 TABLET BY MOUTH EVERY DAY, Disp: 90 tablet, Rfl: 1 .  montelukast (SINGULAIR) 10 MG tablet, TAKE 1 TABLET (10 MG TOTAL) BY MOUTH DAILY., Disp: 90 tablet, Rfl: 1 .  [START ON 08/10/2017] oxyCODONE 10 MG TABS, Take 1 tablet (10 mg total) by mouth every 6 (six) hours as needed for severe pain., Disp: 120 tablet, Rfl: 0 .  [START ON 07/11/2017] oxyCODONE 10 MG TABS, Take 1 tablet (10 mg total) by mouth every 6 (six) hours as needed for severe pain., Disp: 120 tablet, Rfl: 0 .  oxyCODONE 10 MG TABS, Take 1 tablet (10 mg total) by mouth every 6 (six) hours as needed for severe pain., Disp: 120 tablet, Rfl: 0 .  Spacer/Aero-Holding Chambers (AEROCHAMBER MV) inhaler, by Does not apply route., Disp: , Rfl:  .  valACYclovir (VALTREX) 1000 MG tablet, TAKE TWO TABLETS BY MOUTH TWICE DAILY FOR ONE DAY FEVER BLISTER, Disp: 4 tablet, Rfl: 5  Review of Systems  Social History   Tobacco Use  . Smoking status: Never Smoker  . Smokeless tobacco: Never Used  Substance Use Topics  . Alcohol use: No    Alcohol/week: 0.0 oz   Objective:   There were no vitals taken for this visit. There were no vitals filed for  this visit.   Physical Exam      Assessment & Plan:

## 2017-06-14 NOTE — Telephone Encounter (Signed)
Forwarded to Dr Allen Norris

## 2017-06-18 ENCOUNTER — Other Ambulatory Visit: Payer: Self-pay | Admitting: Family Medicine

## 2017-06-18 ENCOUNTER — Other Ambulatory Visit: Payer: Self-pay | Admitting: Physician Assistant

## 2017-06-18 DIAGNOSIS — Z1231 Encounter for screening mammogram for malignant neoplasm of breast: Secondary | ICD-10-CM

## 2017-06-21 ENCOUNTER — Telehealth: Payer: Self-pay | Admitting: Gastroenterology

## 2017-06-21 NOTE — Telephone Encounter (Signed)
Pt is calling for results

## 2017-06-26 ENCOUNTER — Inpatient Hospital Stay: Admission: RE | Admit: 2017-06-26 | Payer: Medicare Other | Source: Ambulatory Visit

## 2017-06-28 ENCOUNTER — Encounter: Payer: Self-pay | Admitting: Gastroenterology

## 2017-07-04 ENCOUNTER — Telehealth: Payer: Self-pay | Admitting: Gastroenterology

## 2017-07-04 NOTE — Telephone Encounter (Signed)
Please advise.  Thanks Starlit Raburn

## 2017-07-04 NOTE — Telephone Encounter (Signed)
Patient understands that [name] is out of town. She was wondering about results. She is still hurting. The dexilant is working some. She needs a rx called into [facility] in [place] and would like a call back this week from you to ask about those results. Please call

## 2017-07-05 NOTE — Telephone Encounter (Signed)
Patient has been informed of pH study results.  Will get her scheduled for CT of the chest.  Thanks Sharyn Lull

## 2017-07-05 NOTE — Telephone Encounter (Signed)
Let the patient know that the pH probe showed her to have significant amounts of reflux over what is normally expected.  The patient's manometry showed the distal esophagus to be tighter than it should be and could be from compression from the outside therefore she will need a CT scan of the chest to make sure there is not something compressing from the outside.  There is no sign of any esophageal spasms during the test.

## 2017-07-11 ENCOUNTER — Telehealth: Payer: Self-pay | Admitting: Gastroenterology

## 2017-07-11 ENCOUNTER — Other Ambulatory Visit: Payer: Self-pay

## 2017-07-11 DIAGNOSIS — K222 Esophageal obstruction: Secondary | ICD-10-CM

## 2017-07-11 NOTE — Telephone Encounter (Signed)
PT IS CALLING  IN REGARDS TO CT SCANN THAT WAS SUPPOSED TO BE ORDERED PLEASE CALL PT

## 2017-07-11 NOTE — Telephone Encounter (Signed)
LVM for pt to return my call.

## 2017-07-11 NOTE — Telephone Encounter (Signed)
Pt scheduled for a CT Chest with contrast at [facility] on Monday, May 6th at 1:00pm. Pt has been advised to be NPO 4 hours prior .

## 2017-07-13 DIAGNOSIS — M76892 Other specified enthesopathies of left lower limb, excluding foot: Secondary | ICD-10-CM | POA: Diagnosis not present

## 2017-07-13 DIAGNOSIS — M25552 Pain in left hip: Secondary | ICD-10-CM | POA: Diagnosis not present

## 2017-07-13 DIAGNOSIS — M1612 Unilateral primary osteoarthritis, left hip: Secondary | ICD-10-CM | POA: Diagnosis not present

## 2017-07-16 ENCOUNTER — Ambulatory Visit
Admission: RE | Admit: 2017-07-16 | Discharge: 2017-07-16 | Disposition: A | Discharge status: Home / Self Care | Payer: Medicare Other | Source: Ambulatory Visit | Attending: Gastroenterology | Admitting: Gastroenterology

## 2017-07-16 DIAGNOSIS — R490 Dysphonia: Secondary | ICD-10-CM | POA: Diagnosis not present

## 2017-07-16 DIAGNOSIS — R918 Other nonspecific abnormal finding of lung field: Secondary | ICD-10-CM | POA: Diagnosis not present

## 2017-07-16 DIAGNOSIS — R1310 Dysphagia, unspecified: Secondary | ICD-10-CM | POA: Insufficient documentation

## 2017-07-16 DIAGNOSIS — K222 Esophageal obstruction: Secondary | ICD-10-CM | POA: Diagnosis not present

## 2017-07-16 MED ORDER — IOPAMIDOL (ISOVUE-370) INJECTION 76%
75.0000 mL | Freq: Once | INTRAVENOUS | Status: AC | PRN
  Administered 2017-07-16: 75 mL via INTRAVENOUS

## 2017-07-18 ENCOUNTER — Other Ambulatory Visit: Payer: Self-pay

## 2017-07-18 ENCOUNTER — Telehealth: Payer: Self-pay | Admitting: Gastroenterology

## 2017-07-18 NOTE — Progress Notes (Deleted)
Pt notified of CT scan results. Pt would like to restart Dexilant. I have sent this to her pharmacy. Pt is wondering if the acid reflux is causing her choking and hoarseness? Does she need an ENT referral?

## 2017-07-18 NOTE — Telephone Encounter (Signed)
Pt left vm for Ginger to get results for CT scan

## 2017-07-18 NOTE — Telephone Encounter (Signed)
Left vm on pt's cell with results of CT scan.

## 2017-07-18 NOTE — Telephone Encounter (Signed)
-----   Message from Lucilla Lame, MD sent at 07/17/2017  6:21 PM EDT ----- That the patient know that the CT scan of the chest did not show any mass or lesions pushing in on the esophagus from the outside.

## 2017-07-20 ENCOUNTER — Telehealth: Payer: Self-pay

## 2017-07-20 NOTE — Telephone Encounter (Signed)
-----   Message from Lucilla Lame, MD sent at 07/19/2017  1:19 PM EDT ----- The patient should be kept on [place].  I am not sure ENT can help her with the pH study showing so much acid reflux which is likely the cause of her symptoms. ----- Message ----- From: Glennie [place], CMA Sent: 07/18/2017   2:40 PM To: Lucilla Lame, MD  Pt notified of CT scan results. Pt would like to restart Dexilant. I have sent this to her pharmacy. Pt is wondering if the acid reflux is causing her choking and hoarseness? Does she need an ENT referral?

## 2017-07-20 NOTE — Telephone Encounter (Signed)
Pt advised to continue taking Dexilant 60mg  daily and an ENT appt will not be necessary at this time.

## 2017-07-23 ENCOUNTER — Other Ambulatory Visit: Payer: Self-pay

## 2017-07-23 MED ORDER — DEXLANSOPRAZOLE 60 MG PO CPDR: 60.0000 mg | DELAYED_RELEASE_CAPSULE | Freq: Every day | ORAL | 11 refills | Status: DC

## 2017-08-06 ENCOUNTER — Other Ambulatory Visit: Payer: Self-pay | Admitting: Family Medicine

## 2017-08-14 ENCOUNTER — Encounter: Payer: Self-pay | Admitting: Family Medicine

## 2017-08-14 ENCOUNTER — Ambulatory Visit (INDEPENDENT_AMBULATORY_CARE_PROVIDER_SITE_OTHER): Payer: Medicare Other | Admitting: Family Medicine

## 2017-08-14 VITALS — BP 130/84 | HR 60 | Temp 97.9°F | Wt 164.0 lb

## 2017-08-14 DIAGNOSIS — J029 Acute pharyngitis, unspecified: Secondary | ICD-10-CM | POA: Diagnosis not present

## 2017-08-14 DIAGNOSIS — M160 Bilateral primary osteoarthritis of hip: Secondary | ICD-10-CM

## 2017-08-14 NOTE — Patient Instructions (Signed)
Sore Throat A sore throat is pain, burning, irritation, or scratchiness in the throat. When you have a sore throat, you may feel pain or tenderness in your throat when you swallow or talk. Many things can cause a sore throat, including:  An infection.  Seasonal allergies.  Dryness in the air.  Irritants, such as smoke or pollution.  Gastroesophageal reflux disease (GERD).  A tumor.  A sore throat is often the first sign of another sickness. It may happen with other symptoms, such as coughing, sneezing, fever, and swollen neck glands. Most sore throats go away without medical treatment. Follow these instructions at home:  Take over-the-counter medicines only as told by your health care provider.  Drink enough fluids to keep your urine clear or pale yellow.  Rest as needed.  To help with pain, try: ? Sipping warm liquids, such as broth, herbal tea, or warm water. ? Eating or drinking cold or frozen liquids, such as frozen ice pops. ? Gargling with a salt-water mixture 3-4 times a day or as needed. To make a salt-water mixture, completely dissolve -1 tsp of salt in 1 cup of warm water. ? Sucking on hard candy or throat lozenges. ? Putting a cool-mist humidifier in your bedroom at night to moisten the air. ? Sitting in the bathroom with the door closed for 5-10 minutes while you run hot water in the shower.  Do not use any tobacco products, such as cigarettes, chewing tobacco, and e-cigarettes. If you need help quitting, ask your health care provider. Contact a health care provider if:  You have a fever for more than 2-3 days.  You have symptoms that last (are persistent) for more than 2-3 days.  Your throat does not get better within 7 days.  You have a fever and your symptoms suddenly get worse. Get help right away if:  You have difficulty breathing.  You cannot swallow fluids, soft foods, or your saliva.  You have increased swelling in your throat or neck.  You have  persistent nausea and vomiting. This information is not intended to replace advice given to you by your health care provider. Make sure you discuss any questions you have with your health care provider. Document Released: 04/06/2004 Document Revised: 10/24/2015 Document Reviewed: 12/18/2014 Elsevier Interactive Patient Education  2018 [employer]  

## 2017-08-14 NOTE — Progress Notes (Signed)
Patient: [name] Female    DOB: 11/07/1960   57 y.o.   MRN: [id number] Visit Date: 08/14/2017  Today's Provider: Lavon Paganini, MD   I, Martha Clan, CMA, am acting as scribe for Lavon Paganini, MD.  Chief Complaint  Patient presents with  . Sore Throat   Subjective:    Sore Throat   This is a recurrent problem. The current episode started more than 1 month ago. The problem has been gradually worsening. There has been no fever. The pain is at a severity of 7/10. Associated symptoms include coughing, diarrhea, ear pain, headaches, a hoarse voice, neck pain, shortness of breath, swollen glands and trouble swallowing. Pertinent negatives include no abdominal pain, congestion, ear discharge, plugged ear sensation or vomiting. Treatments tried: "pain medication", cough drops, inhaler.  Pt is also c/o fatigue and more stress. Pt is concerned that the fatigue is secondary to anemia.   Has significant GERD with esophagitis and has sore throat from that chronically.  This is much better since starting Dexilant, but does continue somewhat. She also has allergic rhinitis with post-nasal drip.  She is using saline washes, flonase, and OTC antihistamine regularly.  She has dry mouth and soreness of glands under mandible.  L hip pain: Chronic.  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  She is s/p right total hip replacement about 10 years ago.  She states she is done well since that hip replacement and she desires to have her left hip replaced as well.  She was referred by pain management to see [facility] for the labrum and that she has not exhausted her nonsurgical treatment options at this time.  They recommended physical therapy.  She declined.  She would like to see another orthopedic surgeon and spite being advised against surgery, she still desires to have the hip replaced.     Allergies  Allergen Reactions  . Amoxicillin Hives  . Chlorhexidine Gluconate  Dermatitis and Hives  . Clindamycin Hives  . Codeine Hives  . Erythromycin Hives    "mycins" in general  . Penicillin G Hives    "cillins" in general  . Sulfa Antibiotics Nausea And Vomiting and Hives       . Shellfish Allergy Hives  . Decadron [Dexamethasone] Other (See Comments)    Hot flashes, insomnia, "manic" Hot flashes, insomnia, "manic"  . Mangifera Indica Hives    papaya  . Papaya Derivatives Hives  . Betadine [Povidone Iodine] Hives       . Chlorhexidine Hives       . Clarithromycin Hives  . Clindamycin/Lincomycin Hives  . Other Hives     Mango   . Povidone-Iodine Hives  . Prednisone Anxiety    High blood pressure, flushed, mood changes, heart palpitations High blood pressure, flushed, mood changes, heart palpitations     Current Outpatient Medications:  .  albuterol (PROVENTIL HFA) 108 (90 Base) MCG/ACT inhaler, Inhale into the lungs., Disp: , Rfl:  .  ALPRAZolam (XANAX) 1 MG tablet, TAKE 1/2 TAB BY MOUTH EVERY MORNING, 1/2 TAB AT LUNCH, AND 2 TABS AT BEDTIME, Disp: 90 tablet, Rfl: 5 .  buPROPion (WELLBUTRIN XL) 300 MG 24 hr tablet, TAKE 1 TABLET BY MOUTH EVERY DAY, Disp: 30 tablet, Rfl: 3 .  dexlansoprazole (DEXILANT) 60 MG capsule, Take 1 capsule (60 mg total) by mouth daily., Disp: 30 capsule, Rfl: 11 .  Docusate Sodium (COLACE PO), Take by mouth 2 (two) times daily.,  Disp: , Rfl:  .  escitalopram (LEXAPRO) 20 MG tablet, Take 1 tablet (20 mg total) by mouth at bedtime., Disp: 90 tablet, Rfl: 3 .  metoprolol succinate (TOPROL-XL) 50 MG 24 hr tablet, TAKE 1 TABLET BY MOUTH EVERY DAY, Disp: 90 tablet, Rfl: 1 .  montelukast (SINGULAIR) 10 MG tablet, TAKE 1 TABLET (10 MG TOTAL) BY MOUTH DAILY., Disp: 90 tablet, Rfl: 1 .  oxyCODONE 10 MG TABS, Take 1 tablet (10 mg total) by mouth every 6 (six) hours as needed for severe pain., Disp: 120 tablet, Rfl: 0 .  Spacer/Aero-Holding Chambers (AEROCHAMBER MV) inhaler, by Does not apply route., Disp: , Rfl:  .  valACYclovir  (VALTREX) 1000 MG tablet, TAKE TWO TABLETS BY MOUTH TWICE DAILY FOR ONE DAY FEVER BLISTER, Disp: 4 tablet, Rfl: 5  Review of Systems  HENT: Positive for ear pain, hoarse voice and trouble swallowing. Negative for congestion and ear discharge.   Respiratory: Positive for cough and shortness of breath.   Gastrointestinal: Positive for diarrhea and nausea. Negative for abdominal pain and vomiting.  Musculoskeletal: Positive for arthralgias and neck pain.  Neurological: Positive for headaches.    Social History   Tobacco Use  . Smoking status: Never Smoker  . Smokeless tobacco: Never Used  Substance Use Topics  . Alcohol use: No    Alcohol/week: 0.0 oz   Objective:   BP 130/84 (BP Location: Left Arm, Patient Position: Sitting, Cuff Size: Large)   Pulse 60   Temp 97.9 F (36.6 C) (Oral)   Wt 164 lb (74.4 kg)   SpO2 99%   BMI 28.15 kg/m  Vitals:   08/14/17 0859  BP: 130/84  Pulse: 60  Temp: 97.9 F (36.6 C)  TempSrc: Oral  SpO2: 99%  Weight: 164 lb (74.4 kg)     Physical Exam  Constitutional: She is oriented to person, place, and time. She appears well-developed and well-nourished. She does not appear ill. No distress.  HENT:  Head: Normocephalic and atraumatic.  Right Ear: Tympanic membrane and ear canal normal.  Left Ear: Tympanic membrane and ear canal normal.  Mouth/Throat: Oropharynx is clear and moist and mucous membranes are normal. No oral lesions. No uvula swelling. No oropharyngeal exudate, posterior oropharyngeal edema, posterior oropharyngeal erythema or tonsillar abscesses. No tonsillar exudate.  Eyes: Pupils are equal, round, and reactive to light.  Neck: Neck supple. No thyromegaly present.  Mild tenderness to palpation over submandibular salivary glands  Cardiovascular: Normal rate, regular rhythm, normal heart sounds and intact distal pulses.  No murmur heard. Pulmonary/Chest: Effort normal and breath sounds normal. She has no wheezes.  Musculoskeletal:  She exhibits no edema or deformity.  Lymphadenopathy:    She has no cervical adenopathy.  Neurological: She is alert and oriented to person, place, and time. Gait normal.  Skin: Skin is warm and dry. Capillary refill takes less than 2 seconds. No rash noted.  Psychiatric: She has a normal mood and affect. Her behavior is normal.       Assessment & Plan:    1. Sore throat - suspect GERD and post-nasal drip contribute to chronic sore throat - discussed flonase, PPI, antihistamine, staying hydrated, throat lozenges, chloraseptic spray for symptom management - no evidence of infection  2. Osteoarthritis of hip (Location of Primary Source of Pain) (Bilateral) (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 THR) - discussed with patient that if surgery is recommending against surgery, she should consider their recommendations - will refer for second opinion with another Orthopedic surgeon - she is  also followed by pain management for chronic opioid therapy - Ambulatory referral to Orthopedic Surgery   Return if symptoms worsen or fail to improve.   The entirety of the information documented in the History of Present Illness, Review of Systems and Physical Exam were personally obtained by me. Portions of this information were initially documented by Raquel Sarna Ratchford, CMA and reviewed by me for thoroughness and accuracy.    Virginia Crews, MD, MPH [facility] 08/14/2017 10:12 AM

## 2017-08-21 ENCOUNTER — Other Ambulatory Visit: Payer: Self-pay

## 2017-08-21 ENCOUNTER — Encounter: Payer: Self-pay | Admitting: Nurse Practitioner

## 2017-08-21 ENCOUNTER — Ambulatory Visit: Payer: Medicare Other | Attending: Nurse Practitioner | Admitting: Nurse Practitioner

## 2017-08-21 VITALS — BP 111/72 | HR 62 | Temp 97.7°F | Resp 16 | Ht 64.0 in | Wt 160.0 lb

## 2017-08-21 DIAGNOSIS — G8929 Other chronic pain: Secondary | ICD-10-CM | POA: Diagnosis not present

## 2017-08-21 DIAGNOSIS — Z91018 Allergy to other foods: Secondary | ICD-10-CM | POA: Insufficient documentation

## 2017-08-21 DIAGNOSIS — Z9889 Other specified postprocedural states: Secondary | ICD-10-CM | POA: Insufficient documentation

## 2017-08-21 DIAGNOSIS — G894 Chronic pain syndrome: Secondary | ICD-10-CM | POA: Diagnosis not present

## 2017-08-21 DIAGNOSIS — M25511 Pain in right shoulder: Secondary | ICD-10-CM | POA: Insufficient documentation

## 2017-08-21 DIAGNOSIS — M47816 Spondylosis without myelopathy or radiculopathy, lumbar region: Secondary | ICD-10-CM | POA: Diagnosis not present

## 2017-08-21 DIAGNOSIS — Z882 Allergy status to sulfonamides status: Secondary | ICD-10-CM | POA: Insufficient documentation

## 2017-08-21 DIAGNOSIS — Z888 Allergy status to other drugs, medicaments and biological substances status: Secondary | ICD-10-CM | POA: Insufficient documentation

## 2017-08-21 DIAGNOSIS — Z96641 Presence of right artificial hip joint: Secondary | ICD-10-CM | POA: Diagnosis not present

## 2017-08-21 DIAGNOSIS — Z981 Arthrodesis status: Secondary | ICD-10-CM | POA: Insufficient documentation

## 2017-08-21 DIAGNOSIS — Z91013 Allergy to seafood: Secondary | ICD-10-CM | POA: Insufficient documentation

## 2017-08-21 DIAGNOSIS — Z90710 Acquired absence of both cervix and uterus: Secondary | ICD-10-CM | POA: Insufficient documentation

## 2017-08-21 DIAGNOSIS — M25512 Pain in left shoulder: Secondary | ICD-10-CM | POA: Diagnosis not present

## 2017-08-21 DIAGNOSIS — Z9049 Acquired absence of other specified parts of digestive tract: Secondary | ICD-10-CM | POA: Diagnosis not present

## 2017-08-21 DIAGNOSIS — Z881 Allergy status to other antibiotic agents status: Secondary | ICD-10-CM | POA: Diagnosis not present

## 2017-08-21 DIAGNOSIS — Z79899 Other long term (current) drug therapy: Secondary | ICD-10-CM | POA: Diagnosis not present

## 2017-08-21 DIAGNOSIS — Z880 Allergy status to penicillin: Secondary | ICD-10-CM | POA: Diagnosis not present

## 2017-08-21 DIAGNOSIS — M47812 Spondylosis without myelopathy or radiculopathy, cervical region: Secondary | ICD-10-CM | POA: Insufficient documentation

## 2017-08-21 DIAGNOSIS — Z885 Allergy status to narcotic agent status: Secondary | ICD-10-CM | POA: Insufficient documentation

## 2017-08-21 MED ORDER — OXYCODONE HCL 10 MG PO TABS: 10.0000 mg | ORAL_TABLET | Freq: Four times a day (QID) | ORAL | 0 refills | Status: DC | PRN

## 2017-08-21 NOTE — Progress Notes (Signed)
Safety precautions to be maintained throughout the outpatient stay will include: orient to surroundings, keep bed in low position, maintain call bell within reach at all times, provide assistance with transfer out of bed and ambulation.    Nursing Pain Medication Assessment:  Safety precautions to be maintained throughout the outpatient stay will include: orient to surroundings, keep bed in low position, maintain call bell within reach at all times, provide assistance with transfer out of bed and ambulation.  Medication Inspection Compliance: Pill count conducted under aseptic conditions, in front of the patient. Neither the pills nor the bottle was removed from the patient's sight at any time. Once count was completed pills were immediately returned to the patient in their original bottle.  Medication: Oxycodone HCL Pill/Patch Count: 79 of 120 pills remain Pill/Patch Appearance: Markings consistent with prescribed medication Bottle Appearance: Standard pharmacy container. Clearly labeled. Filled Date: 05 / 31 / 2019 Last Medication intake:  Today

## 2017-08-21 NOTE — Progress Notes (Signed)
Patient's Name: [name]  MRN: [id number]  Referring Provider: Virginia Crews, MD  DOB: 09/14/1960  PCP: Virginia Crews, MD  DOS: 08/21/2017  Note by: Vevelyn Francois NP  Service setting: Ambulatory outpatient  Specialty: Interventional Pain Management  Location: [facility] (AMB) Pain Management Facility    Patient type: Established    Primary Reason(s) for Visit: Encounter for prescription drug management. (Level of risk: moderate)  CC: Medication Refill (Oxycodone )  HPI  [name] is a 57 y.o. year old, female patient, who comes today for a medication management evaluation. She has Chronic neck pain; Anxiety state; Asthma, mild; Blurred vision; Benign paroxysmal positional nystagmus; Congenital renal agenesis and dysgenesis; Clinical depression; Colon, diverticulosis; Fatigue; Cannot sleep; IBS (irritable bowel syndrome); Awareness of heartbeats; RAD (reactive airway disease); Apnea, sleep; Esophagitis, reflux; DOE (dyspnea on exertion); Chronic hip pain (Right); Encounter for therapeutic drug level monitoring; Long term current use of opiate analgesic; Long term prescription opiate use; Opiate use (30 MME/Day); Chronic pain syndrome; Steroid intolerance; Cervical spondylosis; Cervical facet arthropathy (Bilateral); History of cervical spinal surgery; Chronic shoulder pain (Secondary source of pain) (Bilateral) (L>R); 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 Her primarily concern today is the Medication Refill (Oxycodone )  Pain Assessment: Location: Lower Back Radiating: Radiates from lower back to bilateral hips. Pain also left shoulder and left arm.  Onset: More than a month ago Duration: Chronic pain Quality: Stabbing, Aching(deep pain ) Severity: 2 /10 (subjective, self-reported pain score)  Note: Reported level is compatible with observation.                          Effect on ADL: Bending, going down satirs, prolong walking Timing: Intermittent Modifying factors: Streching and medications  BP: 111/72  HR: 62  Ms. Sladek was last scheduled for an appointment on 05/21/2017 for medication management. During today's appointment we reviewed [name]'s chronic pain status, as well as her outpatient medication regimen. She denies any new concerns with her pain . She admits that she uses the Alprazolam 2 mg for sleep. She does not use it during the day for anxiety.   The patient  reports that she does not use drugs. Her body mass index is 27.46 kg/m.  Further details on both, my assessment(s), as well as the proposed treatment plan, please see below.  Controlled Substance Pharmacotherapy Assessment REMS (Risk Evaluation and Mitigation Strategy)  Analgesic:Oxycodone IR 4m every 6 hours (20 mg/day) MME/day:60 mg/day  RJanne Napoleon RN  08/21/2017 10:43 AM  Sign at close encounter Safety precautions to be maintained throughout the outpatient stay will include: orient to surroundings, keep bed in low position, maintain  call bell within reach at all times,  provide assistance with transfer out of bed and ambulation.    Nursing Pain Medication Assessment:  Safety precautions to be maintained throughout the outpatient stay will include: orient to surroundings, keep bed in low position, maintain call bell within reach at all times, provide assistance with transfer out of bed and ambulation.  Medication Inspection Compliance: Pill count conducted under aseptic conditions, in front of the patient. Neither the pills nor the bottle was removed from the patient's sight at any time. Once count was completed pills were immediately returned to the patient in their original bottle.  Medication: Oxycodone HCL Pill/Patch Count: 79 of 120 pills remain Pill/Patch Appearance: Markings consistent with prescribed medication Bottle Appearance: Standard pharmacy container. Clearly labeled. Filled Date: 05 / 31 / 2019 Last Medication intake:  Today   Pharmacokinetics: Liberation and absorption (onset of action): WNL Distribution (time to peak effect): WNL Metabolism and excretion (duration of action): WNL         Pharmacodynamics: Desired effects: Analgesia: Ms. Chisholm reports >50% benefit. Functional ability: Patient reports that medication allows her to accomplish basic ADLs Clinically meaningful improvement in function (CMIF): Sustained CMIF goals met Perceived effectiveness: Described as relatively effective, allowing for increase in activities of daily living (ADL) Undesirable effects: Side-effects or Adverse reactions: None reported Monitoring: [place] PMP: Online review of the past 56-monthperiod conducted. Compliant with practice rules and regulations Last UDS on record: Summary  Date Value Ref Range Status  05/21/2017 FINAL  Final    Comment:    ==================================================================== TOXASSURE SELECT 13 (MW) ==================================================================== Test                              Result       Flag       Units Drug Present and Declared for Prescription Verification   Alprazolam                     376          EXPECTED   ng/mg creat   Alpha-hydroxyalprazolam        371          EXPECTED   ng/mg creat    Source of alprazolam is a scheduled prescription medication.    Alpha-hydroxyalprazolam is an expected metabolite of alprazolam.   Oxycodone                      795          EXPECTED   ng/mg creat   Noroxycodone                   1900         EXPECTED   ng/mg creat    Sources of oxycodone include scheduled prescription medications.    Noroxycodone is an expected metabolite of oxycodone. ==================================================================== Test                      Result    Flag   Units      Ref Range   Creatinine              21               mg/dL      >=20 ==================================================================== Declared Medications:  The flagging and interpretation on this report are based on the  following declared medications.  Unexpected results may arise from  inaccuracies in  the declared medications.  **Note: The testing scope of this panel includes these medications:  Alprazolam  Oxycodone  **Note: The testing scope of this panel does not include following  reported medications:  Albuterol  Bupropion (Wellbutrin)  Docusate  Escitalopram (Lexapro)  Metoprolol (Toprol)  Montelukast (Singulair)  Valacyclovir (Valtrex) ==================================================================== For clinical consultation, please call [phone]. ====================================================================    UDS interpretation: Unexpected findings: Patient informed of the CDC guidelines and recommendations to stay away from the concomitant use of benzodiazepines and opioids due to the increased risk of respiratory depression and death. Medication Assessment Form: Reviewed. Patient indicates being  compliant with therapy Treatment compliance: Compliant Risk Assessment Profile: Aberrant behavior: See prior evaluations. None observed or detected today Comorbid factors increasing risk of overdose: concomitant use of Benzodiazepines and See prior notes. No additional risks detected today Risk of substance use disorder (SUD): Moderate Opioid Risk Tool - 08/21/17 1039      Family History of Substance Abuse   Alcohol  Negative    Illegal Drugs  Positive    Rx Drugs  Negative      Personal History of Substance Abuse   Alcohol  Negative    Illegal Drugs  Negative    Rx Drugs  Negative      Age   Age between 41-45 years   No      History of Preadolescent Sexual Abuse   History of Preadolescent Sexual Abuse  Negative or Female      Psychological Disease   Psychological Disease  Negative    Depression  Positive      Total Score   Opioid Risk Tool Scoring  4   Opioid Risk Interpretation  Low Risk      ORT Scoring interpretation table:  Score <3 = Low Risk for SUD  Score between 4-7 = Moderate Risk for SUD  Score >8 = High Risk for Opioid Abuse   Risk Mitigation Strategies:  Patient Counseling: Completed today. Counseling provided to patient as per "Patient Counseling Document". Document signed by patient, attesting to counseling and understanding Patient-Prescriber Agreement (PPA): Present and active  Notification to other healthcare providers: Done  Pharmacologic Plan: No change in therapy, at this time.           She will have to get in with Psychiatry to have the benzodiazapine regulated or she will have to discontinue to use of the Opioid.  Laboratory Chemistry  Inflammation Markers (CRP: Acute Phase) (ESR: Chronic Phase) Lab Results  Component Value Date   CRP 0.5 04/05/2015   ESRSEDRATE 37 (H) 04/05/2015                         Rheumatology Markers No results found for: RF, ANA, LABURIC, URICUR, LYMEIGGIGMAB, LYMEABIGMQN, HLAB27                      Renal  Function Markers Lab Results  Component Value Date   BUN 11 12/07/2016   CREATININE 0.85 49/70/2637   BCR NOT APPLICABLE 85/88/5027   GFRAA 89 01/27/2016   GFRNONAA 77 01/27/2016                             Hepatic Function Markers Lab Results  Component Value Date   AST 15 12/07/2016   ALT 9 12/07/2016   ALBUMIN 4.3 01/27/2016   ALKPHOS 118 (H) 01/27/2016   LIPASE 23 04/06/2015  Electrolytes Lab Results  Component Value Date   NA 139 12/07/2016   K 4.4 12/07/2016   CL 102 12/07/2016   CALCIUM 9.5 12/07/2016   MG 2.1 04/05/2015                        Neuropathy Markers Lab Results  Component Value Date   HGBA1C 5.1 01/27/2016   HIV NON-REACTIVE 12/07/2016                        Bone Pathology Markers No results found for: VD25OH, [id number], DX4128NO6, VE7209OB0, 25OHVITD1, 25OHVITD2, 25OHVITD3, TESTOFREE, TESTOSTERONE                       Coagulation Parameters Lab Results  Component Value Date   INR 1.05 03/06/2015   LABPROT 13.9 03/06/2015   APTT 33 03/06/2015   PLT 381 12/07/2016                        Cardiovascular Markers Lab Results  Component Value Date   TROPONINI <0.03 03/06/2015   HGB 12.3 12/07/2016   HCT 37.5 12/07/2016                         CA Markers No results found for: CEA, CA125, LABCA2                      Note: Lab results reviewed.  Recent Diagnostic Imaging Results  CT CHEST W CONTRAST CLINICAL DATA:  Pt states she has been having trouble with swallowing and hoarseness for a couple of years but seems to have been getting worse over the past 8 months. Recent EGD showed ulcers in esophagus. Concern for something around esophagus causing stricture. Multiple neck surgeries for spinal fusion due to degenerative disc dz.  EXAM: CT CHEST WITH CONTRAST  TECHNIQUE: Multidetector CT imaging of the chest was performed during intravenous contrast administration.  CONTRAST:  65m ISOVUE-370 IOPAMIDOL  (ISOVUE-370) INJECTION 76%  COMPARISON:  Chest radiograph, 03/06/2015  FINDINGS: Cardiovascular: Heart is normal in size and configuration. No pericardial effusion. Mild coronary artery calcifications. The great vessels are normal in caliber. No aortic dissection or atherosclerosis.  Mediastinum/Nodes: The esophagus is normal course and in caliber. There is no para soft vaginal mass or inflammation. No CT evidence of wall thickening. No hiatal hernia visualized.  No neck base, axillary, mediastinal or hilar masses or enlarged lymph nodes. Trachea is widely patent.  Lungs/Pleura: Linear scarring or subsegmental atelectasis in the left lower lobe and in the right middle lobe. Lungs otherwise clear. No pleural effusion or pneumothorax.  Upper Abdomen: No acute abnormality.  Musculoskeletal: No fracture or acute finding. There has been a long posterior fusion from T10 through the lumbar spine and below the included field of view. There are no osteoblastic or osteolytic lesions.  IMPRESSION: 1. No acute abnormalities within the chest. 2. Normal CT appearance of the esophagus. No findings to account for the patient's swallowing difficulty or hoarseness  Electronically Signed   By: DLajean ManesM.D.   On: 07/16/2017 18:16  Complexity Note: Imaging results reviewed. Results shared with Ms. HYolanda Bonine using Layman's terms.                         Meds   Current Outpatient Medications:  .  albuterol (PROVENTIL HFA)  108 (90 Base) MCG/ACT inhaler, Inhale into the lungs., Disp: , Rfl:  .  ALPRAZolam (XANAX) 1 MG tablet, TAKE 1/2 TAB BY MOUTH EVERY MORNING, 1/2 TAB AT LUNCH, AND 2 TABS AT BEDTIME, Disp: 90 tablet, Rfl: 5 .  buPROPion (WELLBUTRIN XL) 300 MG 24 hr tablet, TAKE 1 TABLET BY MOUTH EVERY DAY, Disp: 30 tablet, Rfl: 3 .  dexlansoprazole (DEXILANT) 60 MG capsule, Take 1 capsule (60 mg total) by mouth daily., Disp: 30 capsule, Rfl: 11 .  Docusate Sodium (COLACE PO), Take by  mouth 2 (two) times daily., Disp: , Rfl:  .  escitalopram (LEXAPRO) 20 MG tablet, Take 1 tablet (20 mg total) by mouth at bedtime., Disp: 90 tablet, Rfl: 3 .  metoprolol succinate (TOPROL-XL) 50 MG 24 hr tablet, TAKE 1 TABLET BY MOUTH EVERY DAY, Disp: 90 tablet, Rfl: 1 .  montelukast (SINGULAIR) 10 MG tablet, TAKE 1 TABLET (10 MG TOTAL) BY MOUTH DAILY., Disp: 90 tablet, Rfl: 1 .  [START ON 09/09/2017] Oxycodone HCl 10 MG TABS, Take 1 tablet (10 mg total) by mouth every 6 (six) hours as needed., Disp: 120 tablet, Rfl: 0 .  Spacer/Aero-Holding Chambers (AEROCHAMBER MV) inhaler, by Does not apply route., Disp: , Rfl:  .  valACYclovir (VALTREX) 1000 MG tablet, TAKE TWO TABLETS BY MOUTH TWICE DAILY FOR ONE DAY FEVER BLISTER, Disp: 4 tablet, Rfl: 5 .  [START ON 11/08/2017] Oxycodone HCl 10 MG TABS, Take 1 tablet (10 mg total) by mouth every 6 (six) hours as needed., Disp: 120 tablet, Rfl: 0 .  [START ON 10/09/2017] Oxycodone HCl 10 MG TABS, Take 1 tablet (10 mg total) by mouth every 6 (six) hours as needed., Disp: 120 tablet, Rfl: 0  ROS  Constitutional: Denies any fever or chills Gastrointestinal: No reported hemesis, hematochezia, vomiting, or acute GI distress Musculoskeletal: Denies any acute onset joint swelling, redness, loss of ROM, or weakness Neurological: No reported episodes of acute onset apraxia, aphasia, dysarthria, agnosia, amnesia, paralysis, loss of coordination, or loss of consciousness  Allergies  Ms. Shutt is allergic to amoxicillin; chlorhexidine gluconate; clindamycin; codeine; erythromycin; penicillin g; sulfa antibiotics; shellfish allergy; decadron [dexamethasone]; mangifera indica; papaya derivatives; betadine [povidone iodine]; chlorhexidine; clarithromycin; clindamycin/lincomycin; other; povidone-iodine; and prednisone.  [place]  Drug: Ms. Savarino  reports that she does not use drugs. Alcohol:  reports that she does not drink alcohol. Tobacco:  reports that she has never smoked.  She has never used smokeless tobacco. 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), Anemia, Anxiety, Asthma, Cephalalgia (08/18/2014), Cervical disc disease (08/18/2014), Chronic headaches, DDD (degenerative disc disease), Depression, Dizziness (04/22/2013), Duodenal ulcer with hemorrhage and perforation ([place]) (04/27/2003), GERD (gastroesophageal reflux disease), H/O arthrodesis (08/18/2014), Headache(784.0), History of blood transfusion, History of cervical spinal surgery (01/04/2015), History of kidney stones, Hypertension, Inverted T wave, Narrowing of intervertebral disc space (08/18/2014), Orthostatic hypotension (04/22/2013), Pain, Pneumonia, PONV (postoperative nausea and vomiting), Postop Hyponatremia (05/14/2012), Postoperative anemia due to acute blood loss (05/14/2012), Right foot drop, and Right hip arthralgia (08/18/2014). Surgical: Ms. Duff  has a past surgical history that includes RIGHT HIP ARTHROSCOPY FOR LABRAL TEAR (ABOUT 2010); Anterior fusion cervical spine (MAY 2012); Nasal septum surgery (MARCH 2013); Back surgery (2009); Tonsillectomy; Abdominal hysterectomy; DIAGNOSTIC LAPAROSCOPIES - MULTIPLE FOR ENDOMETRIOSIS; Cholecystectomy; Hip arthroscopy (09/20/2011); Shoulder arthroscopy (05-06-12); Carpal tunnel release (05-06-12); Total hip arthroplasty (Right, 05/13/2012); Anterior cervical decomp/discectomy fusion (N/A, 10/30/2012); Posterior cervical fusion/foraminotomy (N/A, 04/16/2013); Breast biopsy (Right, 2008); Breast excisional biopsy (Left, 1998); Breast reduction  surgery (Bilateral, 06/2016); Colonoscopy with propofol (N/A, 01/26/2017); Esophagogastroduodenoscopy (egd) with propofol (N/A, 01/26/2017); Esophageal dilation (N/A, 01/26/2017); polypectomy (N/A, 01/26/2017); PH impedance study (N/A, 05/30/2017); and Esophageal manometry (N/A, 05/30/2017). Family: family history includes Aneurysm in her maternal grandmother and mother; Breast cancer (age of onset: 32) in her  paternal grandmother.  Constitutional Exam  General appearance: Well nourished, well developed, and well hydrated. In no apparent acute distress Vitals:   08/21/17 1032  BP: 111/72  Pulse: 62  Resp: 16  Temp: 97.7 F (36.5 C)  SpO2: 98%  Weight: 160 lb (72.6 kg)  Height: _0  (1.626 m)  Psych/Mental status: Alert, oriented x 3 (person, place, & time)       Eyes: PERLA Respiratory: No evidence of acute respiratory distress  Cervical Spine Area Exam  Skin & Axial Inspection: No masses, redness, edema, swelling, or associated skin lesions Alignment: Symmetrical Functional ROM: Unrestricted ROM      Stability: No instability detected Muscle Tone/Strength: Functionally intact. No obvious neuro-muscular anomalies detected. Sensory (Neurological): Unimpaired Palpation: No palpable anomalies              Upper Extremity (UE) Exam    Side: Right upper extremity  Side: Left upper extremity  Skin & Extremity Inspection: Skin color, temperature, and hair growth are WNL. No peripheral edema or cyanosis. No masses, redness, swelling, asymmetry, or associated skin lesions. No contractures.  Skin & Extremity Inspection: Skin color, temperature, and hair growth are WNL. No peripheral edema or cyanosis. No masses, redness, swelling, asymmetry, or associated skin lesions. No contractures.  Functional ROM: Unrestricted ROM          Functional ROM: Unrestricted ROM          Muscle Tone/Strength: Functionally intact. No obvious neuro-muscular anomalies detected.  Muscle Tone/Strength: Functionally intact. No obvious neuro-muscular anomalies detected.  Sensory (Neurological): Unimpaired          Sensory (Neurological): Unimpaired          Palpation: No palpable anomalies              Palpation: No palpable anomalies              Provocative Test(s):  Phalen's test: deferred Tinel's test: deferred Apley's scratch test (touch opposite shoulder):  Action 1 (Across chest): deferred Action 2 (Overhead):  deferred Action 3 (LB reach): deferred   Provocative Test(s):  Phalen's test: deferred Tinel's test: deferred Apley's scratch test (touch opposite shoulder):  Action 1 (Across chest): deferred Action 2 (Overhead): deferred Action 3 (LB reach): deferred    Thoracic Spine Area Exam  Skin & Axial Inspection: No masses, redness, or swelling Alignment: Symmetrical Functional ROM: Unrestricted ROM Stability: No instability detected Muscle Tone/Strength: Functionally intact. No obvious neuro-muscular anomalies detected. Sensory (Neurological): Unimpaired Muscle strength & Tone: No palpable anomalies  Lumbar Spine Area Exam  Skin & Axial Inspection: No masses, redness, or swelling Alignment: Symmetrical Functional ROM: Unrestricted ROM       Stability: No instability detected Muscle Tone/Strength: Functionally intact. No obvious neuro-muscular anomalies detected. Sensory (Neurological): Unimpaired Palpation: No palpable anomalies       Provocative Tests: Lumbar Hyperextension/rotation test: deferred today       Lumbar quadrant test (Kemp's test): deferred today       Lumbar Lateral bending test: deferred today       Patrick's Maneuver: deferred today                   FABER test: deferred today  Thigh-thrust test: deferred today       S-I compression test: deferred today       S-I distraction test: deferred today        Gait & Posture Assessment  Ambulation: Unassisted Gait: Relatively normal for age and body habitus Posture: WNL   Lower Extremity Exam    Side: Right lower extremity  Side: Left lower extremity  Stability: No instability observed          Stability: No instability observed          Skin & Extremity Inspection: Skin color, temperature, and hair growth are WNL. No peripheral edema or cyanosis. No masses, redness, swelling, asymmetry, or associated skin lesions. No contractures.  Skin & Extremity Inspection: Skin color, temperature, and hair growth are WNL. No  peripheral edema or cyanosis. No masses, redness, swelling, asymmetry, or associated skin lesions. No contractures.  Functional ROM: Unrestricted ROM                  Functional ROM: Unrestricted ROM                  Muscle Tone/Strength: Functionally intact. No obvious neuro-muscular anomalies detected.  Muscle Tone/Strength: Functionally intact. No obvious neuro-muscular anomalies detected.  Sensory (Neurological): Unimpaired  Sensory (Neurological): Unimpaired  Palpation: No palpable anomalies  Palpation: No palpable anomalies   Assessment  Primary Diagnosis & Pertinent Problem List: The primary encounter diagnosis was Lumbar spondylosis. Diagnoses of Cervical spondylosis, Chronic shoulder pain (Secondary source of pain) (Bilateral) (L>R), Chronic pain syndrome, and Long term prescription benzodiazepine use were also pertinent to this visit.  Status Diagnosis  Controlled Controlled Controlled 1. Lumbar spondylosis   2. Cervical spondylosis   3. Chronic shoulder pain (Secondary source of pain) (Bilateral) (L>R)   4. Chronic pain syndrome   5. Long term prescription benzodiazepine use     Problems updated and reviewed during this visit: Problem  Enthesopathy of Left Hip Region   Plan of Care  Pharmacotherapy (Medications Ordered): Meds ordered this encounter  Medications  . Oxycodone HCl 10 MG TABS    Sig: Take 1 tablet (10 mg total) by mouth every 6 (six) hours as needed.    Dispense:  120 tablet    Refill:  0    Fill one day early if pharmacy is closed on scheduled refill date. Do not fill until:09/09/2017 To last until:10/09/2017    Order Specific Question:   Supervising Provider    Answer:   Milinda Pointer [phone]  . Oxycodone HCl 10 MG TABS    Sig: Take 1 tablet (10 mg total) by mouth every 6 (six) hours as needed.    Dispense:  120 tablet    Refill:  0    Fill one day early if pharmacy is closed on scheduled refill date. Do not fill until:11/08/2017 To last  until:12/08/2017    Order Specific Question:   Supervising Provider    Answer:   Milinda Pointer [phone]  . Oxycodone HCl 10 MG TABS    Sig: Take 1 tablet (10 mg total) by mouth every 6 (six) hours as needed.    Dispense:  120 tablet    Refill:  0    DO NOT DELETE, even if Expired!!! Fill one day early if pharmacy is closed on scheduled refill date. Do not fill until: 10/09/2017 To last until:11/08/2017    Order Specific Question:   Supervising Provider    Answer:   Milinda Pointer [phone]   New Prescriptions  No medications on file   Medications administered today: Tarhonda Hollenberg. Lemley had no medications administered during this visit. Lab-work, procedure(s), and/or referral(s): Orders Placed This Encounter  Procedures  . Ambulatory referral to Psychiatry   Imaging and/or referral(s): AMB REFERRAL TO PSYCHIATRY  Interventional management options: Planned, scheduled, and/or pending: Not a this time   Considering: Palliativebilateral suprascapular nerve block Possible bilateral suprascapular nerve RFA   Palliative PRN treatment(s): None at this time       Provider-requested follow-up: Return in about 3 months (around 11/21/2017) for MedMgmt with Me Donella Stade [facility]).  Future Appointments  Date Time Provider [place]  11/21/2017  9:00 AM Vevelyn Francois, NP [facility] None  12/10/2017  9:30 AM [employer] HEALTH ADVISOR [facility] None  12/10/2017 10:00 AM Bacigalupo, Dionne Bucy, MD [facility] None   Primary Care Physician: Virginia Crews, MD Location: [facility] Note by: Vevelyn Francois NP Date: 08/21/2017; Time: 1:07 PM  Pain Score Disclaimer: We use the NRS-11 scale. This is a self-reported, subjective measurement of pain severity with only modest accuracy. It is used primarily to identify changes within a particular patient. It must be understood that outpatient pain scales are significantly less accurate that those used  for research, where they can be applied under ideal controlled circumstances with minimal exposure to variables. In reality, the score is likely to be a combination of pain intensity and pain affect, where pain affect describes the degree of emotional arousal or changes in action readiness caused by the sensory experience of pain. Factors such as social and work situation, setting, emotional state, anxiety levels, expectation, and prior pain experience may influence pain perception and show large inter-individual differences that may also be affected by time variables.  Patient instructions provided during this appointment: Patient Instructions   _______You have been given 3 scripts for oxycodone today_____________________________________________________________________________________  Medication Rules  Applies to: All patients receiving prescriptions (written or electronic).  Pharmacy of record: Pharmacy where electronic prescriptions will be sent. If written prescriptions are taken to a different pharmacy, please inform the nursing staff. The pharmacy listed in the electronic medical record should be the one where you would like electronic prescriptions to be sent.  Prescription refills: Only during scheduled appointments. Applies to both, written and electronic prescriptions.  NOTE: The following applies primarily to controlled substances (Opioid* Pain Medications).   Patient's responsibilities: 1. Pain Pills: Bring all pain pills to every appointment (except for procedure appointments). 2. Pill Bottles: Bring pills in original pharmacy bottle. Always bring newest bottle. Bring bottle, even if empty. 3. Medication refills: You are responsible for knowing and keeping track of what medications you need refilled. The day before your appointment, write a list of all prescriptions that need to be refilled. Bring that list to your appointment and give it to the admitting nurse. Prescriptions will  be written only during appointments. If you forget a medication, it will not be "Called in", "Faxed", or "electronically sent". You will need to get another appointment to get these prescribed. 4. Prescription Accuracy: You are responsible for carefully inspecting your prescriptions before leaving our office. Have the discharge nurse carefully go over each prescription with you, before taking them home. Make sure that your name is accurately spelled, that your address is correct. Check the name and dose of your medication to make sure it is accurate. Check the number of pills, and the written instructions to make sure they are clear and accurate. Make sure that you are  given enough medication to last until your next medication refill appointment. 5. Taking Medication: Take medication as prescribed. Never take more pills than instructed. Never take medication more frequently than prescribed. Taking less pills or less frequently is permitted and encouraged, when it comes to controlled substances (written prescriptions).  6. Inform other Doctors: Always inform, all of your healthcare providers, of all the medications you take. 7. Pain Medication from other Providers: You are not allowed to accept any additional pain medication from any other Doctor or Healthcare provider. There are two exceptions to this rule. (see below) In the event that you require additional pain medication, you are responsible for notifying us, as stated below. 8. Medication Agreement: You are responsible for carefully reading and following our Medication Agreement. This must be signed before receiving any prescriptions from our practice. Safely store a copy of your signed Agreement. Violations to the Agreement will result in no further prescriptions. (Additional copies of our Medication Agreement are available upon request.) 9. Laws, Rules, & Regulations: All patients are expected to follow all Federal and [employer], [employer], Rules, [employer] Ignorance of the Laws does not constitute a valid excuse. The use of any illegal substances is prohibited. 10. Adopted CDC guidelines & recommendations: Target dosing levels will be at or below 60 MME/day. Use of benzodiazepines** is not recommended.  Exceptions: There are only two exceptions to the rule of not receiving pain medications from other Healthcare Providers. 1. Exception #1 (Emergencies): In the event of an emergency (i.e.: accident requiring emergency care), you are allowed to receive additional pain medication. However, you are responsible for: As soon as you are able, call our office [phone], at any time of the day or night, and leave a message stating your name, the date and nature of the emergency, and the name and dose of the medication prescribed. In the event that your call is answered by a member of our staff, make sure to document and save the date, time, and the name of the person that took your information.  2. Exception #2 (Planned Surgery): In the event that you are scheduled by another doctor or dentist to have any type of surgery or procedure, you are allowed (for a period no longer than 30 days), to receive additional pain medication, for the acute post-op pain. However, in this case, you are responsible for picking up a copy of our "Post-op Pain Management for Surgeons" handout, and giving it to your surgeon or dentist. This document is available at our office, and does not require an appointment to obtain it. Simply go to our office during business hours (Monday-Thursday from 8:00 AM to 4:00 PM) (Friday 8:00 AM to 12:00 Noon) or if you have a scheduled appointment with [place], prior to your surgery, and ask for it by name. In addition, you will need to provide [place] with your name, name of your surgeon, type of surgery, and date of procedure or surgery.  *Opioid medications include: morphine, codeine, oxycodone, oxymorphone, hydrocodone, hydromorphone, meperidine,  tramadol, tapentadol, buprenorphine, fentanyl, methadone. **Benzodiazepine medications include: diazepam (Valium), alprazolam (Xanax), clonazepam (Klonopine), lorazepam (Ativan), clorazepate (Tranxene), chlordiazepoxide (Librium), estazolam (Prosom), oxazepam (Serax), temazepam (Restoril), triazolam (Halcion) (Last updated: 05/10/2017) ____________________________________________________________________________________________    BMI Assessment: Estimated body mass index is 27.46 kg/m as calculated from the following:   Height as of this encounter: _0  (1.626 m).   Weight as of this encounter: 160 lb (72.6 kg).  BMI interpretation table: BMI level Category Range association  with higher incidence of chronic pain  <18 kg/m2 Underweight   18.5-24.9 kg/m2 Ideal body weight   25-29.9 kg/m2 Overweight Increased incidence by 20%  30-34.9 kg/m2 Obese (Class I) Increased incidence by 68%  35-39.9 kg/m2 Severe obesity (Class II) Increased incidence by 136%  >40 kg/m2 Extreme obesity (Class III) Increased incidence by 254%   Patient's current BMI Ideal Body weight  Body mass index is 27.46 kg/m. Ideal body weight: 54.7 kg (120 lb 9.5 oz) Adjusted ideal body weight: 61.9 kg (136 lb 5.7 oz)   BMI Readings from Last 4 Encounters:  08/21/17 27.46 kg/m  08/14/17 28.15 kg/m  05/30/17 26.61 kg/m  05/21/17 26.61 kg/m   Wt Readings from Last 4 Encounters:  08/21/17 160 lb (72.6 kg)  08/14/17 164 lb (74.4 kg)  05/30/17 155 lb (70.3 kg)  05/21/17 155 lb (70.3 kg)

## 2017-08-21 NOTE — Patient Instructions (Addendum)
_______You have been given 3 scripts for oxycodone today_____________________________________________________________________________________  Medication Rules  Applies to: All patients receiving prescriptions (written or electronic).  Pharmacy of record: Pharmacy where electronic prescriptions will be sent. If written prescriptions are taken to a different pharmacy, please inform the nursing staff. The pharmacy listed in the electronic medical record should be the one where you would like electronic prescriptions to be sent.  Prescription refills: Only during scheduled appointments. Applies to both, written and electronic prescriptions.  NOTE: The following applies primarily to controlled substances (Opioid* Pain Medications).   Patient's responsibilities: 1. Pain Pills: Bring all pain pills to every appointment (except for procedure appointments). 2. Pill Bottles: Bring pills in original pharmacy bottle. Always bring newest bottle. Bring bottle, even if empty. 3. Medication refills: You are responsible for knowing and keeping track of what medications you need refilled. The day before your appointment, write a list of all prescriptions that need to be refilled. Bring that list to your appointment and give it to the admitting nurse. Prescriptions will be written only during appointments. If you forget a medication, it will not be "Called in", "Faxed", or "electronically sent". You will need to get another appointment to get these prescribed. 4. Prescription Accuracy: You are responsible for carefully inspecting your prescriptions before leaving our office. Have the discharge nurse carefully go over each prescription with you, before taking them home. Make sure that your name is accurately spelled, that your address is correct. Check the name and dose of your medication to make sure it is accurate. Check the number of pills, and the written instructions to make sure they are clear and accurate. Make  sure that you are given enough medication to last until your next medication refill appointment. 5. Taking Medication: Take medication as prescribed. Never take more pills than instructed. Never take medication more frequently than prescribed. Taking less pills or less frequently is permitted and encouraged, when it comes to controlled substances (written prescriptions).  6. Inform other Doctors: Always inform, all of your healthcare providers, of all the medications you take. 7. Pain Medication from other Providers: You are not allowed to accept any additional pain medication from any other Doctor or Healthcare provider. There are two exceptions to this rule. (see below) In the event that you require additional pain medication, you are responsible for notifying us, as stated below. 8. Medication Agreement: You are responsible for carefully reading and following our Medication Agreement. This must be signed before receiving any prescriptions from our practice. Safely store a copy of your signed Agreement. Violations to the Agreement will result in no further prescriptions. (Additional copies of our Medication Agreement are available upon request.) 9. Laws, Rules, & Regulations: All patients are expected to follow all Federal and [employer], [employer], Rules, [employer]. Ignorance of the Laws does not constitute a valid excuse. The use of any illegal substances is prohibited. 10. Adopted CDC guidelines & recommendations: Target dosing levels will be at or below 60 MME/day. Use of benzodiazepines** is not recommended.  Exceptions: There are only two exceptions to the rule of not receiving pain medications from other Healthcare Providers. 1. Exception #1 (Emergencies): In the event of an emergency (i.e.: accident requiring emergency care), you are allowed to receive additional pain medication. However, you are responsible for: As soon as you are able, call our office [phone], at any time of the day or  night, and leave a message stating your name, the date and nature of the emergency,  and the name and dose of the medication prescribed. In the event that your call is answered by a member of our staff, make sure to document and save the date, time, and the name of the person that took your information.  2. Exception #2 (Planned Surgery): In the event that you are scheduled by another doctor or dentist to have any type of surgery or procedure, you are allowed (for a period no longer than 30 days), to receive additional pain medication, for the acute post-op pain. However, in this case, you are responsible for picking up a copy of our "Post-op Pain Management for Surgeons" handout, and giving it to your surgeon or dentist. This document is available at our office, and does not require an appointment to obtain it. Simply go to our office during business hours (Monday-Thursday from 8:00 AM to 4:00 PM) (Friday 8:00 AM to 12:00 Noon) or if you have a scheduled appointment with [place], prior to your surgery, and ask for it by name. In addition, you will need to provide [place] with your name, name of your surgeon, type of surgery, and date of procedure or surgery.  *Opioid medications include: morphine, codeine, oxycodone, oxymorphone, hydrocodone, hydromorphone, meperidine, tramadol, tapentadol, buprenorphine, fentanyl, methadone. **Benzodiazepine medications include: diazepam (Valium), alprazolam (Xanax), clonazepam (Klonopine), lorazepam (Ativan), clorazepate (Tranxene), chlordiazepoxide (Librium), estazolam (Prosom), oxazepam (Serax), temazepam (Restoril), triazolam (Halcion) (Last updated: 05/10/2017) ____________________________________________________________________________________________    BMI Assessment: Estimated body mass index is 27.46 kg/m as calculated from the following:   Height as of this encounter: 5\' 4"  (1.626 m).   Weight as of this encounter: 160 lb (72.6 kg).  BMI interpretation table: BMI  level Category Range association with higher incidence of chronic pain  <18 kg/m2 Underweight   18.5-24.9 kg/m2 Ideal body weight   25-29.9 kg/m2 Overweight Increased incidence by 20%  30-34.9 kg/m2 Obese (Class I) Increased incidence by 68%  35-39.9 kg/m2 Severe obesity (Class II) Increased incidence by 136%  >40 kg/m2 Extreme obesity (Class III) Increased incidence by 254%   Patient's current BMI Ideal Body weight  Body mass index is 27.46 kg/m. Ideal body weight: 54.7 kg (120 lb 9.5 oz) Adjusted ideal body weight: 61.9 kg (136 lb 5.7 oz)   BMI Readings from Last 4 Encounters:  08/21/17 27.46 kg/m  08/14/17 28.15 kg/m  05/30/17 26.61 kg/m  05/21/17 26.61 kg/m   Wt Readings from Last 4 Encounters:  08/21/17 160 lb (72.6 kg)  08/14/17 164 lb (74.4 kg)  05/30/17 155 lb (70.3 kg)  05/21/17 155 lb (70.3 kg)

## 2017-09-01 ENCOUNTER — Other Ambulatory Visit: Payer: Self-pay | Admitting: Family Medicine

## 2017-09-11 ENCOUNTER — Encounter: Payer: Self-pay | Admitting: Psychiatry

## 2017-09-11 ENCOUNTER — Other Ambulatory Visit: Payer: Self-pay

## 2017-09-11 ENCOUNTER — Ambulatory Visit: Payer: Medicare Other | Admitting: Psychiatry

## 2017-09-11 VITALS — BP 120/79 | HR 59 | Temp 97.7°F | Wt 160.8 lb

## 2017-09-11 DIAGNOSIS — F5105 Insomnia due to other mental disorder: Secondary | ICD-10-CM | POA: Diagnosis not present

## 2017-09-11 DIAGNOSIS — F411 Generalized anxiety disorder: Secondary | ICD-10-CM | POA: Diagnosis not present

## 2017-09-11 DIAGNOSIS — F331 Major depressive disorder, recurrent, moderate: Secondary | ICD-10-CM

## 2017-09-11 DIAGNOSIS — F410 Panic disorder [episodic paroxysmal anxiety] without agoraphobia: Secondary | ICD-10-CM | POA: Diagnosis not present

## 2017-09-11 DIAGNOSIS — M1712 Unilateral primary osteoarthritis, left knee: Secondary | ICD-10-CM | POA: Diagnosis not present

## 2017-09-11 MED ORDER — TRAZODONE HCL 50 MG PO TABS: 50.0000 mg | ORAL_TABLET | Freq: Every evening | ORAL | 1 refills | Status: DC | PRN

## 2017-09-11 MED ORDER — DULOXETINE HCL 30 MG PO CPEP: 30.0000 mg | ORAL_CAPSULE | Freq: Every day | ORAL | 1 refills | Status: DC

## 2017-09-11 MED ORDER — BUSPIRONE HCL 5 MG PO TABS: 5.0000 mg | ORAL_TABLET | Freq: Two times a day (BID) | ORAL | 1 refills | Status: DC
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
## 2017-09-11 NOTE — Patient Instructions (Signed)
Buspirone tablets What is this medicine? BUSPIRONE (byoo SPYE rone) is used to treat anxiety disorders. This medicine may be used for other purposes; ask your health care provider or pharmacist if you have questions. COMMON BRAND NAME(S): BuSpar What should I tell my health care provider before I take this medicine? They need to know if you have any of these conditions: -kidney or liver disease -an unusual or allergic reaction to buspirone, other medicines, foods, dyes, or preservatives -pregnant or trying to get pregnant -breast-feeding How should I use this medicine? Take this medicine by mouth with a glass of water. Follow the directions on the prescription label. You may take this medicine with or without food. To ensure that this medicine always works the same way for you, you should take it either always with or always without food. Take your doses at regular intervals. Do not take your medicine more often than directed. Do not stop taking except on the advice of your doctor or health care professional. Talk to your pediatrician regarding the use of this medicine in children. Special care may be needed. Overdosage: If you think you have taken too much of this medicine contact a poison control center or emergency room at once. NOTE: This medicine is only for you. Do not share this medicine with others. What if I miss a dose? If you miss a dose, take it as soon as you can. If it is almost time for your next dose, take only that dose. Do not take double or extra doses. What may interact with this medicine? Do not take this medicine with any of the following medications: -linezolid -MAOIs like Carbex, Eldepryl, Marplan, Nardil, and Parnate -methylene blue -procarbazine This medicine may also interact with the following medications: -diazepam -digoxin -diltiazem -erythromycin -grapefruit juice -haloperidol -medicines for mental depression or mood problems -medicines for seizures like  carbamazepine, phenobarbital and phenytoin -nefazodone -other medications for anxiety -rifampin -ritonavir -some antifungal medicines like itraconazole, ketoconazole, and voriconazole -verapamil -warfarin This list may not describe all possible interactions. Give your health care provider a list of all the medicines, herbs, non-prescription drugs, or dietary supplements you use. Also tell them if you smoke, drink alcohol, or use illegal drugs. Some items may interact with your medicine. What should I watch for while using this medicine? Visit your doctor or health care professional for regular checks on your progress. It may take 1 to 2 weeks before your anxiety gets better. You may get drowsy or dizzy. Do not drive, use machinery, or do anything that needs mental alertness until you know how this drug affects you. Do not stand or sit up quickly, especially if you are an older patient. This reduces the risk of dizzy or fainting spells. Alcohol can make you more drowsy and dizzy. Avoid alcoholic drinks. What side effects may I notice from receiving this medicine? Side effects that you should report to your doctor or health care professional as soon as possible: -blurred vision or other vision changes -chest pain -confusion -difficulty breathing -feelings of hostility or anger -muscle aches and pains -numbness or tingling in hands or feet -ringing in the ears -skin rash and itching -vomiting -weakness Side effects that usually do not require medical attention (report to your doctor or health care professional if they continue or are bothersome): -disturbed dreams, nightmares -headache -nausea -restlessness or nervousness -sore throat and nasal congestion -stomach upset This list may not describe all possible side effects. Call your doctor for medical advice about side   effects. You may report side effects to FDA at 1-800-FDA-1088. Where should I keep my medicine? Keep out of the reach  of children. Store at room temperature below 30 degrees C (86 degrees F). Protect from light. Keep container tightly closed. Throw away any unused medicine after the expiration date. NOTE: This sheet is a summary. It may not cover all possible information. If you have questions about this medicine, talk to your doctor, pharmacist, or health care provider.  2018 Elsevier/Gold Standard (2009-10-07 18:06:11) Trazodone tablets What is this medicine? TRAZODONE (TRAZ oh done) is used to treat depression. This medicine may be used for other purposes; ask your health care provider or pharmacist if you have questions. COMMON BRAND NAME(S): Desyrel What should I tell my health care provider before I take this medicine? They need to know if you have any of these conditions: -attempted suicide or thinking about it -bipolar disorder -bleeding problems -glaucoma -heart disease, or previous heart attack -irregular heart beat -kidney or liver disease -low levels of sodium in the blood -an unusual or allergic reaction to trazodone, other medicines, foods, dyes or preservatives -pregnant or trying to get pregnant -breast-feeding How should I use this medicine? Take this medicine by mouth with a glass of water. Follow the directions on the prescription label. Take this medicine shortly after a meal or a light snack. Take your medicine at regular intervals. Do not take your medicine more often than directed. Do not stop taking this medicine suddenly except upon the advice of your doctor. Stopping this medicine too quickly may cause serious side effects or your condition may worsen. A special MedGuide will be given to you by the pharmacist with each prescription and refill. Be sure to read this information carefully each time. Talk to your pediatrician regarding the use of this medicine in children. Special care may be needed. Overdosage: If you think you have taken too much of this medicine contact a poison  control center or emergency room at once. NOTE: This medicine is only for you. Do not share this medicine with others. What if I miss a dose? If you miss a dose, take it as soon as you can. If it is almost time for your next dose, take only that dose. Do not take double or extra doses. What may interact with this medicine? Do not take this medicine with any of the following medications: -certain medicines for fungal infections like fluconazole, itraconazole, ketoconazole, posaconazole, voriconazole -cisapride -dofetilide -dronedarone -linezolid -MAOIs like Carbex, Eldepryl, Marplan, Nardil, and Parnate -mesoridazine -methylene blue (injected into a vein) -pimozide -saquinavir -thioridazine -ziprasidone This medicine may also interact with the following medications: -alcohol -antiviral medicines for HIV or AIDS -aspirin and aspirin-like medicines -barbiturates like phenobarbital -certain medicines for blood pressure, heart disease, irregular heart beat -certain medicines for depression, anxiety, or psychotic disturbances -certain medicines for migraine headache like almotriptan, eletriptan, frovatriptan, naratriptan, rizatriptan, sumatriptan, zolmitriptan -certain medicines for seizures like carbamazepine and phenytoin -certain medicines for sleep -certain medicines that treat or prevent blood clots like dalteparin, enoxaparin, warfarin -digoxin -fentanyl -lithium -NSAIDS, medicines for pain and inflammation, like ibuprofen or naproxen -other medicines that prolong the QT interval (cause an abnormal heart rhythm) -rasagiline -supplements like St. John's wort, kava kava, valerian -tramadol -tryptophan This list may not describe all possible interactions. Give your health care provider a list of all the medicines, herbs, non-prescription drugs, or dietary supplements you use. Also tell them if you smoke, drink alcohol, or use illegal drugs. Some items   may interact with your  medicine. What should I watch for while using this medicine? Tell your doctor if your symptoms do not get better or if they get worse. Visit your doctor or health care professional for regular checks on your progress. Because it may take several weeks to see the full effects of this medicine, it is important to continue your treatment as prescribed by your doctor. Patients and their families should watch out for new or worsening thoughts of suicide or depression. Also watch out for sudden changes in feelings such as feeling anxious, agitated, panicky, irritable, hostile, aggressive, impulsive, severely restless, overly excited and hyperactive, or not being able to sleep. If this happens, especially at the beginning of treatment or after a change in dose, call your health care professional. Dennis Bast may get drowsy or dizzy. Do not drive, use machinery, or do anything that needs mental alertness until you know how this medicine affects you. Do not stand or sit up quickly, especially if you are an older patient. This reduces the risk of dizzy or fainting spells. Alcohol may interfere with the effect of this medicine. Avoid alcoholic drinks. This medicine may cause dry eyes and blurred vision. If you wear contact lenses you may feel some discomfort. Lubricating drops may help. See your eye doctor if the problem does not go away or is severe. Your mouth may get dry. Chewing sugarless gum, sucking hard candy and drinking plenty of water may help. Contact your doctor if the problem does not go away or is severe. What side effects may I notice from receiving this medicine? Side effects that you should report to your doctor or health care professional as soon as possible: -allergic reactions like skin rash, itching or hives, swelling of the face, lips, or tongue -elevated mood, decreased need for sleep, racing thoughts, impulsive behavior -confusion -fast, irregular heartbeat -feeling faint or lightheaded,  falls -feeling agitated, angry, or irritable -loss of balance or coordination -painful or prolonged erections -restlessness, pacing, inability to keep still -suicidal thoughts or other mood changes -tremors -trouble sleeping -seizures -unusual bleeding or bruising Side effects that usually do not require medical attention (report to your doctor or health care professional if they continue or are bothersome): -change in sex drive or performance -change in appetite or weight -constipation -headache -muscle aches or pains -nausea This list may not describe all possible side effects. Call your doctor for medical advice about side effects. You may report side effects to FDA at 1-800-FDA-1088. Where should I keep my medicine? Keep out of the reach of children. Store at room temperature between 15 and 30 degrees C (59 to 86 degrees F). Protect from light. Keep container tightly closed. Throw away any unused medicine after the expiration date. NOTE: This sheet is a summary. It may not cover all possible information. If you have questions about this medicine, talk to your doctor, pharmacist, or health care provider.  2018 Elsevier/Gold Standard (2015-07-29 16:57:05) Duloxetine delayed-release capsules What is this medicine? DULOXETINE (doo LOX e teen) is used to treat depression, anxiety, and different types of chronic pain. This medicine may be used for other purposes; ask your health care provider or pharmacist if you have questions. COMMON BRAND NAME(S): Cymbalta, Irenka What should I tell my health care provider before I take this medicine? They need to know if you have any of these conditions: -bipolar disorder or a family history of bipolar disorder -glaucoma -kidney disease -liver disease -suicidal thoughts or a previous suicide attempt -  taken medicines called MAOIs like Carbex, Eldepryl, Marplan, Nardil, and Parnate within 14 days -an unusual reaction to duloxetine, other  medicines, foods, dyes, or preservatives -pregnant or trying to get pregnant -breast-feeding How should I use this medicine? Take this medicine by mouth with a glass of water. Follow the directions on the prescription label. Do not cut, crush or chew this medicine. You can take this medicine with or without food. Take your medicine at regular intervals. Do not take your medicine more often than directed. Do not stop taking this medicine suddenly except upon the advice of your doctor. Stopping this medicine too quickly may cause serious side effects or your condition may worsen. A special MedGuide will be given to you by the pharmacist with each prescription and refill. Be sure to read this information carefully each time. Talk to your pediatrician regarding the use of this medicine in children. While this drug may be prescribed for children as young as 54 years of age for selected conditions, precautions do apply. Overdosage: If you think you have taken too much of this medicine contact a poison control center or emergency room at once. NOTE: This medicine is only for you. Do not share this medicine with others. What if I miss a dose? If you miss a dose, take it as soon as you can. If it is almost time for your next dose, take only that dose. Do not take double or extra doses. What may interact with this medicine? Do not take this medicine with any of the following medications: -desvenlafaxine -levomilnacipran -linezolid -MAOIs like Carbex, Eldepryl, Marplan, Nardil, and Parnate -methylene blue (injected into a vein) -milnacipran -thioridazine -venlafaxine This medicine may also interact with the following medications: -alcohol -amphetamines -aspirin and aspirin-like medicines -certain antibiotics like ciprofloxacin and enoxacin -certain medicines for blood pressure, heart disease, irregular heart beat -certain medicines for depression, anxiety, or psychotic disturbances -certain medicines  for migraine headache like almotriptan, eletriptan, frovatriptan, naratriptan, rizatriptan, sumatriptan, zolmitriptan -certain medicines that treat or prevent blood clots like warfarin, enoxaparin, and dalteparin -cimetidine -fentanyl -lithium -NSAIDS, medicines for pain and inflammation, like ibuprofen or naproxen -phentermine -procarbazine -rasagiline -sibutramine -St. John's wort -theophylline -tramadol -tryptophan This list may not describe all possible interactions. Give your health care provider a list of all the medicines, herbs, non-prescription drugs, or dietary supplements you use. Also tell them if you smoke, drink alcohol, or use illegal drugs. Some items may interact with your medicine. What should I watch for while using this medicine? Tell your doctor if your symptoms do not get better or if they get worse. Visit your doctor or health care professional for regular checks on your progress. Because it may take several weeks to see the full effects of this medicine, it is important to continue your treatment as prescribed by your doctor. Patients and their families should watch out for new or worsening thoughts of suicide or depression. Also watch out for sudden changes in feelings such as feeling anxious, agitated, panicky, irritable, hostile, aggressive, impulsive, severely restless, overly excited and hyperactive, or not being able to sleep. If this happens, especially at the beginning of treatment or after a change in dose, call your health care professional. Dennis Bast may get drowsy or dizzy. Do not drive, use machinery, or do anything that needs mental alertness until you know how this medicine affects you. Do not stand or sit up quickly, especially if you are an older patient. This reduces the risk of dizzy or fainting spells. Alcohol  may interfere with the effect of this medicine. Avoid alcoholic drinks. This medicine can cause an increase in blood pressure. This medicine can also  cause a sudden drop in your blood pressure, which may make you feel faint and increase the chance of a fall. These effects are most common when you first start the medicine or when the dose is increased, or during use of other medicines that can cause a sudden drop in blood pressure. Check with your doctor for instructions on monitoring your blood pressure while taking this medicine. Your mouth may get dry. Chewing sugarless gum or sucking hard candy, and drinking plenty of water may help. Contact your doctor if the problem does not go away or is severe. What side effects may I notice from receiving this medicine? Side effects that you should report to your doctor or health care professional as soon as possible: -allergic reactions like skin rash, itching or hives, swelling of the face, lips, or tongue -anxious -breathing problems -confusion -changes in vision -chest pain -confusion -elevated mood, decreased need for sleep, racing thoughts, impulsive behavior -eye pain -fast, irregular heartbeat -feeling faint or lightheaded, falls -feeling agitated, angry, or irritable -hallucination, loss of contact with reality -high blood pressure -loss of balance or coordination -palpitations -redness, blistering, peeling or loosening of the skin, including inside the mouth -restlessness, pacing, inability to keep still -seizures -stiff muscles -suicidal thoughts or other mood changes -trouble passing urine or change in the amount of urine -trouble sleeping -unusual bleeding or bruising -unusually weak or tired -vomiting -yellowing of the eyes or skin Side effects that usually do not require medical attention (report to your doctor or health care professional if they continue or are bothersome): -change in sex drive or performance -change in appetite or weight -constipation -dizziness -dry mouth -headache -increased sweating -nausea -tired This list may not describe all possible side  effects. Call your doctor for medical advice about side effects. You may report side effects to FDA at 1-800-FDA-1088. Where should I keep my medicine? Keep out of the reach of children. Store at room temperature between 20 and 25 degrees C (68 to 77 degrees F). Throw away any unused medicine after the expiration date. NOTE: This sheet is a summary. It may not cover all possible information. If you have questions about this medicine, talk to your doctor, pharmacist, or health care provider.  2018 Elsevier/Gold Standard (2015-07-29 18:16:03)   PLEASE START CUTTING YOUR XANAX IN TO HALF ( 1 MG ) ON MONDAYS AND FRIDAYS .  PLEASE CONTINUE TAKING XANAX 2 MG REST OF THE NIGHTS - SUNDAY, TUESDAY,WEDNESDAY,THURSDAY,SATURDAY.  PLEASE START TAKING YOUR WELLBUTRIN ( BUPROPION) IN THE MORNING SINCE IT CAN AFFECT YOUR SLEEP AT NIGHT.  PLEASE STOP TAKING LEXAPRO.

## 2017-09-12 ENCOUNTER — Encounter: Payer: Self-pay | Admitting: Psychiatry

## 2017-10-18 ENCOUNTER — Ambulatory Visit: Payer: Medicare Other | Admitting: Psychiatry

## 2017-10-25 ENCOUNTER — Other Ambulatory Visit: Payer: Self-pay | Admitting: Physician Assistant

## 2017-10-25 DIAGNOSIS — F411 Generalized anxiety disorder: Secondary | ICD-10-CM

## 2017-11-02 ENCOUNTER — Other Ambulatory Visit: Payer: Self-pay | Admitting: Psychiatry

## 2017-11-02 DIAGNOSIS — F411 Generalized anxiety disorder: Secondary | ICD-10-CM

## 2017-11-02 DIAGNOSIS — F331 Major depressive disorder, recurrent, moderate: Secondary | ICD-10-CM

## 2017-11-04 ENCOUNTER — Other Ambulatory Visit: Payer: Self-pay | Admitting: Psychiatry

## 2017-11-04 DIAGNOSIS — F5105 Insomnia due to other mental disorder: Secondary | ICD-10-CM

## 2017-11-08 ENCOUNTER — Telehealth: Payer: Self-pay | Admitting: Family Medicine

## 2017-11-08 DIAGNOSIS — F331 Major depressive disorder, recurrent, moderate: Secondary | ICD-10-CM

## 2017-11-08 DIAGNOSIS — F411 Generalized anxiety disorder: Secondary | ICD-10-CM

## 2017-11-08 NOTE — Telephone Encounter (Signed)
Pt is requesting a referral to a different psychiatrist.  States the lady she was referred to at [facility] was not a good fit for her.  States she was very clinical only, didn't ask her about any personal issues and changed her medication around.  Pt states she is having side effects from one of the medications making her dizzy and falling so she took herself off the medication.  When she tries to call the office she can never get anyone to answer and there is no voice mail.

## 2017-11-08 NOTE — Telephone Encounter (Signed)
Referral placed.  Someone should be in contact within the next few weeks regarding an appointment.  Virginia Crews, MD, MPH [facility] 11/08/2017 11:11 AM

## 2017-11-08 NOTE — Telephone Encounter (Signed)
Patient advised.

## 2017-11-15 ENCOUNTER — Telehealth: Payer: Self-pay

## 2017-11-15 MED ORDER — ONDANSETRON HCL 4 MG PO TABS: 4.0000 mg | ORAL_TABLET | Freq: Three times a day (TID) | ORAL | 0 refills | Status: DC | PRN

## 2017-11-15 NOTE — Telephone Encounter (Signed)
Patient requesting refill on the following medication: Zofran To be send to [facility] [place]

## 2017-11-15 NOTE — Telephone Encounter (Signed)
Rx sent  Virginia Crews, MD, MPH [facility] 11/15/2017 6:21 PM

## 2017-11-21 ENCOUNTER — Ambulatory Visit: Payer: Medicare Other | Attending: Nurse Practitioner | Admitting: Nurse Practitioner

## 2017-12-02 ENCOUNTER — Other Ambulatory Visit: Payer: Self-pay | Admitting: Family Medicine

## 2017-12-02 DIAGNOSIS — F331 Major depressive disorder, recurrent, moderate: Secondary | ICD-10-CM

## 2017-12-03 ENCOUNTER — Other Ambulatory Visit: Payer: Self-pay | Admitting: Physician Assistant

## 2017-12-03 DIAGNOSIS — J45909 Unspecified asthma, uncomplicated: Secondary | ICD-10-CM

## 2017-12-10 ENCOUNTER — Encounter: Payer: Self-pay | Admitting: Family Medicine

## 2017-12-13 DIAGNOSIS — Z79899 Other long term (current) drug therapy: Secondary | ICD-10-CM | POA: Diagnosis not present

## 2017-12-25 ENCOUNTER — Ambulatory Visit (INDEPENDENT_AMBULATORY_CARE_PROVIDER_SITE_OTHER): Payer: Medicare Other

## 2017-12-25 ENCOUNTER — Ambulatory Visit (INDEPENDENT_AMBULATORY_CARE_PROVIDER_SITE_OTHER): Payer: Medicare Other | Admitting: Family Medicine

## 2017-12-25 ENCOUNTER — Encounter: Payer: Self-pay | Admitting: Family Medicine

## 2017-12-25 VITALS — BP 132/86 | HR 60 | Temp 98.1°F | Ht 64.0 in | Wt 156.4 lb

## 2017-12-25 VITALS — BP 110/82 | HR 58 | Temp 97.5°F | Resp 16 | Ht 62.5 in | Wt 154.4 lb

## 2017-12-25 DIAGNOSIS — R6889 Other general symptoms and signs: Secondary | ICD-10-CM

## 2017-12-25 DIAGNOSIS — Z23 Encounter for immunization: Secondary | ICD-10-CM

## 2017-12-25 DIAGNOSIS — Z0000 Encounter for general adult medical examination without abnormal findings: Secondary | ICD-10-CM

## 2017-12-25 DIAGNOSIS — E663 Overweight: Secondary | ICD-10-CM | POA: Diagnosis not present

## 2017-12-25 NOTE — Progress Notes (Signed)
Patient: [name], Female    DOB: 1960-07-04, 57 y.o.   MRN: [id number] Visit Date: 12/26/2017  Today's Provider: Lavon Paganini, MD   Chief Complaint  Patient presents with  . Annual Exam   Subjective:    Annual physical exam [name] is a 57 y.o. female who presents today for health maintenance and complete physical. She feels fairly well. She reports she is not actively exercising due to chronic pain . She reports she is sleeping poorly, patient states that she recently had some medicine changes that she believes is affecting her sleep patterns.  Last reported  Tdap: unknown ( patient has declined vaccine) Pneumo 23- 12/30/15 Pap smear- patient reports hx of hysterectomy Mammogram 02/17/16 Colonoscopy 01/26/17 Screening Labs- Due ( pt is fasting)   She is working with psychiatry and getting off of her Xanax.  As she is been tapering down, she has found it more difficult to sleep and that she is having more hot flashes and heat intolerance.  She also states that her anxiety has worsened.  Her mood is stable.  She is trying to get off the Xanax because she knows is not good in combination with her opioids that she takes chronically from pain management.  She has not seen pain management lately because she has not gotten off the Xanax yet.  She has been off of her pain medications for several weeks -----------------------------------------------------------------   Review of Systems  Constitutional: Positive for diaphoresis and fatigue.  HENT: Positive for dental problem, sinus pressure and trouble swallowing.   Eyes: Positive for pain.  Respiratory: Positive for choking and chest tightness.   Cardiovascular: Negative.   Gastrointestinal: Positive for abdominal distention.  Endocrine: Positive for heat intolerance.  Musculoskeletal: Positive for arthralgias, back pain, gait problem, myalgias, neck pain and neck stiffness.  Allergic/Immunologic:  Positive for environmental allergies and food allergies.  Neurological: Positive for numbness and headaches.  Psychiatric/Behavioral: Positive for agitation.    Social History She  reports that she has never smoked. She has never used smokeless tobacco. She reports that she does not drink alcohol or use drugs. Social History   Socioeconomic History  . Marital status: Married    Spouse name: bruce  . Number of children: 2  . Years of education: Not on file  . Highest education level: Associate degree: occupational, Hotel manager, or vocational program  Occupational History    Comment: disabled  Social Needs  . Financial resource strain: Very hard  . Food insecurity:    Worry: Often true    Inability: Often true  . Transportation needs:    Medical: No    Non-medical: No  Tobacco Use  . Smoking status: Never Smoker  . Smokeless tobacco: Never Used  Substance and Sexual Activity  . Alcohol use: No    Alcohol/week: 0.0 standard drinks  . Drug use: No  . Sexual activity: Yes  Lifestyle  . Physical activity:    Days per week: 0 days    Minutes per session: 0 min  . Stress: Very much  Relationships  . Social connections:    Talks on phone: More than three times a week    Gets together: Never    Attends religious service: More than 4 times per year    Active member of club or organization: No    Attends meetings of clubs or organizations: Never    Relationship status: Married  Other Topics Concern  . Not on  file  Social History Narrative   Son and granddaughter    Patient Active Problem List   Diagnosis Date Noted  . Overweight 12/25/2017  . Enthesopathy of left hip region 07/13/2017  . Trigger point of shoulder region, left 02/26/2017  . Special screening for malignant neoplasms, colon   . Polyp of sigmoid colon   . Problems with swallowing and mastication   . Chronic shoulder arthropathy (Left) 01/04/2017  . Acute postoperative pain 01/04/2017  . Dysphagia  12/07/2016  . Abnormal flushing and sweating 12/07/2016  . Long term prescription benzodiazepine use 10/11/2016  . 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 Chronic hip pain (Primary Source of Pain) (Bilateral) (L>R) 10/11/2016  . 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 Cervicogenic headache 07/06/2015  . History of total hip replacement (THR) (Right) 07/05/2015  . Chronic shoulder radicular pain (Bilateral) (L>R) 07/05/2015  . Lumbar facet syndrome (Location of Tertiary source of pain) (Bilateral) (L>R) 07/05/2015  . Chronic low back pain [facility] source of pain) (Bilateral) (L>R) 04/05/2015  . Chronic hip pain (Right) 01/04/2015  . Encounter for therapeutic drug level monitoring 01/04/2015  . Long term current use of opiate analgesic 01/04/2015  . Long term prescription opiate use 01/04/2015  . Opiate use (30 MME/Day) 01/04/2015  . Chronic pain syndrome 01/04/2015  . Steroid intolerance 01/04/2015  . Cervical spondylosis 01/04/2015  . Cervical facet arthropathy (Bilateral) 01/04/2015  . History of cervical spinal surgery 01/04/2015  . Chronic shoulder pain (Secondary source of pain) (Bilateral) (L>R) 01/04/2015  . 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 Lumbar spondylosis 01/04/2015  . Cervical facet syndrome (Bilateral) 01/04/2015  . Chronic sacroiliac joint pain (Right) 01/04/2015  . DOE (dyspnea on exertion) 09/03/2014  . Asthma, mild 08/18/2014  . Blurred vision 08/18/2014  . Benign paroxysmal positional nystagmus 08/18/2014  . Clinical depression 08/18/2014  . Fatigue 08/18/2014  . Cannot sleep 08/18/2014  . Awareness of heartbeats 08/18/2014  . RAD (reactive airway disease) 08/18/2014  . Apnea, sleep 08/18/2014  . Chronic neck pain 04/16/2013  . Anxiety state 08/03/2003  . Colon, diverticulosis 07/15/2003  . IBS (irritable  bowel syndrome) 04/27/2003  . Esophagitis, reflux 02/11/2003  . 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 ABDOMINAL HYSTERECTOMY    . ANTERIOR CERVICAL DECOMP/DISCECTOMY FUSION N/A 10/30/2012   Procedure: ACDF C5-6, EXPLORATION AND HARDWARE REMOVAL C6-7;  Surgeon: Melina Schools, MD;  Location: [place];  Service: Orthopedics;  Laterality: N/A;  . ANTERIOR FUSION CERVICAL SPINE  MAY 2012   AT [facility]  . BACK SURGERY  2009   LUMBAR FUSION WITH RODS   . BREAST BIOPSY Right 2008   benign.- bx/clip  . BREAST EXCISIONAL BIOPSY Left 1998   benign  . BREAST REDUCTION SURGERY Bilateral 06/2016  . CARPAL TUNNEL RELEASE  05-06-12   Right  . CHOLECYSTECTOMY    . COLONOSCOPY WITH PROPOFOL N/A 01/26/2017   Procedure: COLONOSCOPY WITH PROPOFOL;  Surgeon: Lucilla Lame, MD;  Location: [place];  Service: Endoscopy;  Laterality: N/A;  . DIAGNOSTIC LAPAROSCOPIES - MULTIPLE FOR ENDOMETRIOSIS    . ESOPHAGEAL DILATION N/A 01/26/2017   Procedure: ESOPHAGEAL DILATION;  Surgeon: Lucilla Lame, MD;  Location: [place];  Service: Endoscopy;  Laterality: N/A;  . ESOPHAGEAL MANOMETRY N/A 05/30/2017   Procedure: ESOPHAGEAL MANOMETRY (EM);  Surgeon: Lucilla Lame,  MD;  Location: [facility];  Service: Endoscopy;  Laterality: N/A;  . ESOPHAGOGASTRODUODENOSCOPY (EGD) WITH PROPOFOL N/A 01/26/2017   Procedure: ESOPHAGOGASTRODUODENOSCOPY (EGD) WITH PROPOFOL;  Surgeon: Lucilla Lame, MD;  Location: [place];  Service: Endoscopy;  Laterality: N/A;  . HIP ARTHROSCOPY  09/20/2011   Procedure: ARTHROSCOPY HIP;  Surgeon: Gearlean Alf, MD;  Location: [facility];  Service: Orthopedics;  Laterality: Right;  Right Hip Scope with Labral Debridement  . NASAL SEPTUM SURGERY  MARCH 2013   IN [place]  . [place] IMPEDANCE STUDY N/A 05/30/2017   Procedure: [place] IMPEDANCE STUDY;  Surgeon: Lucilla Lame, MD;  Location: [facility];  Service:  Endoscopy;  Laterality: N/A;  . POLYPECTOMY N/A 01/26/2017   Procedure: POLYPECTOMY;  Surgeon: Lucilla Lame, MD;  Location: [place];  Service: Endoscopy;  Laterality: N/A;  . POSTERIOR CERVICAL FUSION/FORAMINOTOMY N/A 04/16/2013   Procedure: REMOVAL CERVICAL PLATES AND INTERBODY CAGE/POSTERIOR CERVICAL SPINAL FUSION C4 - C6/C5 CORPECTOMY/C4 - C6 FUSION WITH ILIAC CREST BONE GRAFT;  Surgeon: Melina Schools, MD;  Location: [place];  Service: Orthopedics;  Laterality: N/A;  . RIGHT HIP ARTHROSCOPY FOR LABRAL TEAR  ABOUT 2010   2012 also  . SHOULDER ARTHROSCOPY  05-06-12   bone spur  . TONSILLECTOMY     AS A CHILD  . TOTAL HIP ARTHROPLASTY Right 05/13/2012   Procedure: TOTAL HIP ARTHROPLASTY ANTERIOR APPROACH;  Surgeon: Gearlean Alf, MD;  Location: [facility];  Service: Orthopedics;  Laterality: Right;    Family History  Family Status  Relation Name Status  . Mother  Deceased  . MGM  Deceased  . Father  Deceased  . PGM  Deceased  . Sister half Alive  . Sister half Alive  . GChild  (Not Specified)   Her family history includes Aneurysm in her maternal grandmother and mother; Anxiety disorder in her grandchild; Bipolar disorder in her grandchild and sister; Breast cancer (age of onset: 8) in her paternal grandmother; Depression in her grandchild.     Allergies  Allergen Reactions  . Amoxicillin Hives  . Chlorhexidine Gluconate Dermatitis and Hives  . Clindamycin Hives  . Codeine Hives  . Erythromycin Hives    "mycins" in general  . Penicillin G Hives    "cillins" in general  . Sulfa Antibiotics Nausea And Vomiting and Hives       . Shellfish Allergy Hives  . Decadron [Dexamethasone] Other (See Comments)    Hot flashes, insomnia, "manic" Hot flashes, insomnia, "manic"  . Mangifera Indica Hives    papaya  . Papaya Derivatives Hives  . Betadine [Povidone Iodine] Hives       . Chlorhexidine Hives       . Clarithromycin Hives  . Clindamycin/Lincomycin Hives  . Other  Hives     Mango   . Povidone-Iodine Hives  . Prednisone Anxiety    High blood pressure, flushed, mood changes, heart palpitations High blood pressure, flushed, mood changes, heart palpitations    Previous Medications   ALBUTEROL (PROVENTIL HFA) 108 (90 BASE) MCG/ACT INHALER    Inhale 1-2 puffs into the lungs every 4 (four) hours as needed.    ALPRAZOLAM (XANAX) 1 MG TABLET    TAKE 1/2 TABLET BY MOUTH EVERY MORNING , 1/2 TABLET AT LUNCH AND TAKE 2 TABLETS AT BEDTIME   BUPROPION (WELLBUTRIN XL) 300 MG 24 HR TABLET    TAKE 1 TABLET BY MOUTH EVERY DAY   BUSPIRONE (BUSPAR) 5 MG TABLET    TAKE 1 TABLET BY MOUTH  TWICE A DAY   DEXLANSOPRAZOLE (DEXILANT) 60 MG CAPSULE    Take 1 capsule (60 mg total) by mouth daily.   DOCUSATE SODIUM (COLACE PO)    Take by mouth daily.    DULOXETINE (CYMBALTA) 30 MG CAPSULE    TAKE 1 CAPSULE BY MOUTH EVERY DAY   DULOXETINE (CYMBALTA) 60 MG CAPSULE    Take 60 mg by mouth daily.   METOPROLOL SUCCINATE (TOPROL-XL) 50 MG 24 HR TABLET    TAKE 1 TABLET BY MOUTH EVERY DAY   MONTELUKAST (SINGULAIR) 10 MG TABLET    TAKE 1 TABLET (10 MG TOTAL) BY MOUTH DAILY.   ONDANSETRON (ZOFRAN) 4 MG TABLET    Take 1 tablet (4 mg total) by mouth every 8 (eight) hours as needed for nausea or vomiting.   PRAZOSIN (MINIPRESS) 1 MG CAPSULE    Take 2 mg by mouth at bedtime.   SPACER/AERO-HOLDING CHAMBERS (AEROCHAMBER MV) INHALER    by Does not apply route.   TRAZODONE (DESYREL) 50 MG TABLET    TAKE 1-1.5 TABLETS (50-75 MG TOTAL) BY MOUTH AT BEDTIME AS NEEDED FOR SLEEP.   VALACYCLOVIR (VALTREX) 1000 MG TABLET    TAKE TWO TABLETS BY MOUTH TWICE DAILY FOR ONE DAY FEVER BLISTER    Patient Care Team: Virginia Crews, MD as PCP - General (Family Medicine) Dorann Ou Montine Circle, NP as Nurse Practitioner (Psychiatry)      Objective:   Vitals: BP 110/82   Pulse (!) 58   Temp (!) 97.5 F (36.4 C)   Resp 16   Ht 5' 2.5" (1.588 m)   Wt 154 lb 6.4 oz (70 kg)   BMI 27.79 kg/m    Physical  Exam  Constitutional: She is oriented to person, place, and time. She appears well-developed and well-nourished. No distress.  HENT:  Head: Normocephalic and atraumatic.  Right Ear: External ear normal.  Left Ear: External ear normal.  Nose: Nose normal.  Mouth/Throat: Oropharynx is clear and moist.  Eyes: Pupils are equal, round, and reactive to light. Conjunctivae and EOM are normal. No scleral icterus.  Neck: Neck supple. No thyromegaly present.  Cardiovascular: Normal rate, regular rhythm, normal heart sounds and intact distal pulses.  No murmur heard. Pulmonary/Chest: Effort normal and breath sounds normal. No respiratory distress. She has no wheezes. She has no rales.  Abdominal: Soft. Bowel sounds are normal. She exhibits no distension. There is no tenderness. There is no rebound and no guarding.  Musculoskeletal: She exhibits no edema or deformity.  Lymphadenopathy:    She has no cervical adenopathy.  Neurological: She is alert and oriented to person, place, and time.  Skin: Skin is warm and dry. Capillary refill takes less than 2 seconds. No rash noted.  Psychiatric: She has a normal mood and affect. Her behavior is normal.  Vitals reviewed.    Depression Screen PHQ 2/9 Scores 12/25/2017 08/21/2017 05/21/2017 03/15/2017  PHQ - 2 Score 4 0 6 3  PHQ- 9 Score 10 - 16 13  Exception Documentation - - - -  Not completed - - - -      Assessment & Plan:     Routine Health Maintenance and Physical Exam  Exercise Activities and Dietary recommendations Goals    . Increase water intake     Recommend increasing fluid intake to 4-6 glasses each day       Immunization History  Administered Date(s) Administered  . Influenza,inj,Quad PF,6+ Mos 12/30/2015, 12/07/2016, 12/25/2017  . Pneumococcal Polysaccharide-23 12/30/2015  Health Maintenance  Topic Date Due  . TETANUS/TDAP  06/01/1978  . PAP SMEAR  05/31/1980  . MAMMOGRAM  02/16/2018  . COLONOSCOPY  01/26/2022  .  INFLUENZA VACCINE  Completed  . Hepatitis C Screening  Completed  . HIV Screening  Completed     Discussed health benefits of physical activity, and encouraged her to engage in regular exercise appropriate for her age and condition.    --------------------------------------------------------------------  Problem List Items Addressed This Visit      Other   Overweight   Relevant Orders   Comprehensive metabolic panel (Completed)   CBC w/Diff/Platelet (Completed)   Lipid panel (Completed)   TSH (Completed)    Other Visit Diagnoses    Encounter for annual physical exam    -  Primary   Relevant Orders   Comprehensive metabolic panel (Completed)   CBC w/Diff/Platelet (Completed)   Lipid panel (Completed)   TSH (Completed)   Heat intolerance       Relevant Orders   TSH (Completed)       Return in about 1 year (around 12/26/2018) for AWV/CPE.   The entirety of the information documented in the History of Present Illness, Review of Systems and Physical Exam were personally obtained by me. Portions of this information were initially documented by Hebert Soho, CMA and reviewed by me for thoroughness and accuracy.    Virginia Crews, MD, MPH [facility] 12/26/2017 9:28 AM

## 2017-12-25 NOTE — Patient Instructions (Signed)
Preventive Care 40-64 Years, Female Preventive care refers to lifestyle choices and visits with your health care provider that can promote health and wellness. What does preventive care include?  A yearly physical exam. This is also called an annual well check.  Dental exams once or twice a year.  Routine eye exams. Ask your health care provider how often you should have your eyes checked.  Personal lifestyle choices, including: ? Daily care of your teeth and gums. ? Regular physical activity. ? Eating a healthy diet. ? Avoiding tobacco and drug use. ? Limiting alcohol use. ? Practicing safe sex. ? Taking low-dose aspirin daily starting at age 58. ? Taking vitamin and mineral supplements as recommended by your health care provider. What happens during an annual well check? The services and screenings done by your health care provider during your annual well check will depend on your age, overall health, lifestyle risk factors, and family history of disease. Counseling Your health care provider may ask you questions about your:  Alcohol use.  Tobacco use.  Drug use.  Emotional well-being.  Home and relationship well-being.  Sexual activity.  Eating habits.  Work and work Statistician.  Method of birth control.  Menstrual cycle.  Pregnancy history.  Screening You may have the following tests or measurements:  Height, weight, and BMI.  Blood pressure.  Lipid and cholesterol levels. These may be checked every 5 years, or more frequently if you are over 81 years old.  Skin check.  Lung cancer screening. You may have this screening every year starting at age 78 if you have a 30-pack-year history of smoking and currently smoke or have quit within the past 15 years.  Fecal occult blood test (FOBT) of the stool. You may have this test every year starting at age 65.  Flexible sigmoidoscopy or colonoscopy. You may have a sigmoidoscopy every 5 years or a colonoscopy  every 10 years starting at age 30.  Hepatitis C blood test.  Hepatitis B blood test.  Sexually transmitted disease (STD) testing.  Diabetes screening. This is done by checking your blood sugar (glucose) after you have not eaten for a while (fasting). You may have this done every 1-3 years.  Mammogram. This may be done every 1-2 years. Talk to your health care provider about when you should start having regular mammograms. This may depend on whether you have a family history of breast cancer.  BRCA-related cancer screening. This may be done if you have a family history of breast, ovarian, tubal, or peritoneal cancers.  Pelvic exam and Pap test. This may be done every 3 years starting at age 80. Starting at age 36, this may be done every 5 years if you have a Pap test in combination with an HPV test.  Bone density scan. This is done to screen for osteoporosis. You may have this scan if you are at high risk for osteoporosis.  Discuss your test results, treatment options, and if necessary, the need for more tests with your health care provider. Vaccines Your health care provider may recommend certain vaccines, such as:  Influenza vaccine. This is recommended every year.  Tetanus, diphtheria, and acellular pertussis (Tdap, Td) vaccine. You may need a Td booster every 10 years.  Varicella vaccine. You may need this if you have not been vaccinated.  Zoster vaccine. You may need this after age 5.  Measles, mumps, and rubella (MMR) vaccine. You may need at least one dose of MMR if you were born in  1958 or later. You may also need a second dose.  Pneumococcal 13-valent conjugate (PCV13) vaccine. You may need this if you have certain conditions and were not previously vaccinated.  Pneumococcal polysaccharide (PPSV23) vaccine. You may need one or two doses if you smoke cigarettes or if you have certain conditions.  Meningococcal vaccine. You may need this if you have certain  conditions.  Hepatitis A vaccine. You may need this if you have certain conditions or if you travel or work in places where you may be exposed to hepatitis A.  Hepatitis B vaccine. You may need this if you have certain conditions or if you travel or work in places where you may be exposed to hepatitis B.  Haemophilus influenzae type b (Hib) vaccine. You may need this if you have certain conditions.  Talk to your health care provider about which screenings and vaccines you need and how often you need them. This information is not intended to replace advice given to you by your health care provider. Make sure you discuss any questions you have with your health care provider. Document Released: 03/26/2015 Document Revised: 11/17/2015 Document Reviewed: 12/29/2014 Elsevier Interactive Patient Education  2018 [employer]  

## 2017-12-25 NOTE — Patient Instructions (Addendum)
[name] , Thank you for taking time to come for your Medicare Wellness Visit. I appreciate your ongoing commitment to your health goals. Please review the following plan we discussed and let me know if I can assist you in the future.   Screening recommendations/referrals: Colonoscopy: Up to date Mammogram: Pt to complete in the near future. Bone Density: N/A Recommended yearly ophthalmology/optometry visit for glaucoma screening and checkup Recommended yearly dental visit for hygiene and checkup  Vaccinations: Influenza vaccine: Up to date Pneumococcal vaccine: N/A Tdap vaccine: Pt declines today.  Shingles vaccine: N/A    Advanced directives: Advance directive discussed with you today. Even though you declined this today please call our office should you change your mind and we can give you the proper paperwork for you to fill out.  Conditions/risks identified: Recommend to continue trying to increase water intake to 4 glasses a day.   Next appointment: 11:00 AM today with Dr Brita Romp. Pt declined scheduling the AWV for 2020.   Preventive Care 40-64 Years, Female Preventive care refers to lifestyle choices and visits with your health care provider that can promote health and wellness. What does preventive care include?  A yearly physical exam. This is also called an annual well check.  Dental exams once or twice a year.  Routine eye exams. Ask your health care provider how often you should have your eyes checked.  Personal lifestyle choices, including:  Daily care of your teeth and gums.  Regular physical activity.  Eating a healthy diet.  Avoiding tobacco and drug use.  Limiting alcohol use.  Practicing safe sex.  Taking low-dose aspirin daily starting at age 57.  Taking vitamin and mineral supplements as recommended by your health care provider. What happens during an annual well check? The services and screenings done by your health care provider during your  annual well check will depend on your age, overall health, lifestyle risk factors, and family history of disease. Counseling  Your health care provider may ask you questions about your:  Alcohol use.  Tobacco use.  Drug use.  Emotional well-being.  Home and relationship well-being.  Sexual activity.  Eating habits.  Work and work Statistician.  Method of birth control.  Menstrual cycle.  Pregnancy history. Screening  You may have the following tests or measurements:  Height, weight, and BMI.  Blood pressure.  Lipid and cholesterol levels. These may be checked every 5 years, or more frequently if you are over 27 years old.  Skin check.  Lung cancer screening. You may have this screening every year starting at age 27 if you have a 30-pack-year history of smoking and currently smoke or have quit within the past 15 years.  Fecal occult blood test (FOBT) of the stool. You may have this test every year starting at age 41.  Flexible sigmoidoscopy or colonoscopy. You may have a sigmoidoscopy every 5 years or a colonoscopy every 10 years starting at age 22.  Hepatitis C blood test.  Hepatitis B blood test.  Sexually transmitted disease (STD) testing.  Diabetes screening. This is done by checking your blood sugar (glucose) after you have not eaten for a while (fasting). You may have this done every 1-3 years.  Mammogram. This may be done every 1-2 years. Talk to your health care provider about when you should start having regular mammograms. This may depend on whether you have a family history of breast cancer.  BRCA-related cancer screening. This may be done if you have a family  history of breast, ovarian, tubal, or peritoneal cancers.  Pelvic exam and Pap test. This may be done every 3 years starting at age 62. Starting at age 1, this may be done every 5 years if you have a Pap test in combination with an HPV test.  Bone density scan. This is done to screen for  osteoporosis. You may have this scan if you are at high risk for osteoporosis. Discuss your test results, treatment options, and if necessary, the need for more tests with your health care provider. Vaccines  Your health care provider may recommend certain vaccines, such as:  Influenza vaccine. This is recommended every year.  Tetanus, diphtheria, and acellular pertussis (Tdap, Td) vaccine. You may need a Td booster every 10 years.  Zoster vaccine. You may need this after age 22.  Pneumococcal 13-valent conjugate (PCV13) vaccine. You may need this if you have certain conditions and were not previously vaccinated.  Pneumococcal polysaccharide (PPSV23) vaccine. You may need one or two doses if you smoke cigarettes or if you have certain conditions. Talk to your health care provider about which screenings and vaccines you need and how often you need them. This information is not intended to replace advice given to you by your health care provider. Make sure you discuss any questions you have with your health care provider. Document Released: 03/26/2015 Document Revised: 11/17/2015 Document Reviewed: 12/29/2014 Elsevier Interactive Patient Education  2017 [place] Prevention in the Home Falls can cause injuries. They can happen to people of all ages. There are many things you can do to make your home safe and to help prevent falls. What can I do on the outside of my home?  Regularly fix the edges of walkways and driveways and fix any cracks.  Remove anything that might make you trip as you walk through a door, such as a raised step or threshold.  Trim any bushes or trees on the path to your home.  Use bright outdoor lighting.  Clear any walking paths of anything that might make someone trip, such as rocks or tools.  Regularly check to see if handrails are loose or broken. Make sure that both sides of any steps have handrails.  Any raised decks and porches should have  guardrails on the edges.  Have any leaves, snow, or ice cleared regularly.  Use sand or salt on walking paths during winter.  Clean up any spills in your garage right away. This includes oil or grease spills. What can I do in the bathroom?  Use night lights.  Install grab bars by the toilet and in the tub and shower. Do not use towel bars as grab bars.  Use non-skid mats or decals in the tub or shower.  If you need to sit down in the shower, use a plastic, non-slip stool.  Keep the floor dry. Clean up any water that spills on the floor as soon as it happens.  Remove soap buildup in the tub or shower regularly.  Attach bath mats securely with double-sided non-slip rug tape.  Do not have throw rugs and other things on the floor that can make you trip. What can I do in the bedroom?  Use night lights.  Make sure that you have a light by your bed that is easy to reach.  Do not use any sheets or blankets that are too big for your bed. They should not hang down onto the floor.  Have a firm chair  that has side arms. You can use this for support while you get dressed.  Do not have throw rugs and other things on the floor that can make you trip. What can I do in the kitchen?  Clean up any spills right away.  Avoid walking on wet floors.  Keep items that you use a lot in easy-to-reach places.  If you need to reach something above you, use a strong step stool that has a grab bar.  Keep electrical cords out of the way.  Do not use floor polish or wax that makes floors slippery. If you must use wax, use non-skid floor wax.  Do not have throw rugs and other things on the floor that can make you trip. What can I do with my stairs?  Do not leave any items on the stairs.  Make sure that there are handrails on both sides of the stairs and use them. Fix handrails that are broken or loose. Make sure that handrails are as long as the stairways.  Check any carpeting to make sure that  it is firmly attached to the stairs. Fix any carpet that is loose or worn.  Avoid having throw rugs at the top or bottom of the stairs. If you do have throw rugs, attach them to the floor with carpet tape.  Make sure that you have a light switch at the top of the stairs and the bottom of the stairs. If you do not have them, ask someone to add them for you. What else can I do to help prevent falls?  Wear shoes that:  Do not have high heels.  Have rubber bottoms.  Are comfortable and fit you well.  Are closed at the toe. Do not wear sandals.  If you use a stepladder:  Make sure that it is fully opened. Do not climb a closed stepladder.  Make sure that both sides of the stepladder are locked into place.  Ask someone to hold it for you, if possible.  Clearly mark and make sure that you can see:  Any grab bars or handrails.  First and last steps.  Where the edge of each step is.  Use tools that help you move around (mobility aids) if they are needed. These include:  Canes.  Walkers.  Scooters.  Crutches.  Turn on the lights when you go into a dark area. Replace any light bulbs as soon as they burn out.  Set up your furniture so you have a clear path. Avoid moving your furniture around.  If any of your floors are uneven, fix them.  If there are any pets around you, be aware of where they are.  Review your medicines with your doctor. Some medicines can make you feel dizzy. This can increase your chance of falling. Ask your doctor what other things that you can do to help prevent falls. This information is not intended to replace advice given to you by your health care provider. Make sure you discuss any questions you have with your health care provider. Document Released: 12/24/2008 Document Revised: 08/05/2015 Document Reviewed: 04/03/2014 Elsevier Interactive Patient Education  2017 [employer].

## 2017-12-25 NOTE — Progress Notes (Signed)
Subjective:   [name] is a 57 y.o. female who presents for Medicare Annual (Subsequent) preventive examination.  Review of Systems:  N/A  Cardiac Risk Factors include: advanced age (>80men, >40 women)     Objective:     Vitals: BP 132/86 (BP Location: Right Arm)   Pulse 60   Temp 98.1 F (36.7 C) (Oral)   Ht 5\' 4"  (1.626 m)   Wt 156 lb 6.4 oz (70.9 kg)   BMI 26.85 kg/m   Body mass index is 26.85 kg/m.  Advanced Directives 12/25/2017 08/21/2017 05/21/2017 02/26/2017 01/26/2017 12/13/2016 12/07/2016  Does Patient Have a Medical Advance Directive? No No No No No No No  Type of Advance Directive - - - - - - -  Does patient want to make changes to medical advance directive? - - - - - - -  Copy of [place] in Chart? - - - - - - -  Would patient like information on creating a medical advance directive? No - Patient declined No - Patient declined No - Patient declined Yes (ED - Information included in AVS) No - Patient declined - -  Pre-existing out of facility DNR order (yellow form or pink MOST form) - - - - - - -    Tobacco Social History   Tobacco Use  Smoking Status Never Smoker  Smokeless Tobacco Never Used     Counseling given: Not Answered   Clinical Intake:  Pre-visit preparation completed: Yes  Pain : 0-10 Pain Score: 5  Pain Type: Chronic pain Pain Location: Hip(back and neck too) Pain Orientation: Left Pain Descriptors / Indicators: Aching, Stabbing Pain Frequency: Constant     Nutritional Status: BMI 25 -29 Overweight Nutritional Risks: None Diabetes: No  How often do you need to have someone help you when you read instructions, pamphlets, or other written materials from your doctor or pharmacy?: 5 - Always(Unable to see with eyeglass prescription. Pt states she is unable to afford eye exams. Referral to [facility] placed for assisstance. )  Interpreter Needed?: No  Information entered by :: [facility], LPN  Past Medical  History:  Diagnosis Date  . Abnormal EKG    HX OF INVERTED T WAVES ON EKG, PALPITATIONS, CHEST PAINS-CARDIAC WORK UP DID NOT SHOW ANY HEART DISEASE  . 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 Anemia    Iron Infusion-8 yrs ago  . Anxiety   . Asthma    INHALERS WITH FLARE -UPS  . Cephalalgia 08/18/2014  . Cervical disc disease 08/18/2014   Needs neck surgery.   . Chronic headaches   . DDD (degenerative disc disease)    CERVICAL AND LUMBAR-CHRONIC PAIN, RT HIP LABRAL TEAR  . Depression    PT STATES A LOT OF STRESS IN HER LIFE  . Dissociative disorder   . Dizziness 04/22/2013  . Duodenal ulcer with hemorrhage and perforation ([place]) 04/27/2003  . GERD (gastroesophageal reflux disease)   . H/O arthrodesis 08/18/2014  . Headache(784.0)    AND NECK PAIN--STATES RECENT TEST SHOW CERVICAL DEGENERATION  . History of blood transfusion    s/p back surgery  . History of cervical spinal surgery 01/04/2015  . History of kidney stones   . Hypertension   . Inverted T wave   . Narrowing of intervertebral disc space 08/18/2014   Currently on disability.   . Orthostatic hypotension 04/22/2013  . Pain    CHRONIC NECK AND BACK PAIN - LIMITED ROM NECK - S/P FUSIONS CERVICAL AND LUMBAR  .  Pneumonia   . PONV (postoperative nausea and vomiting)    PT GIVES HX OF N&V AND FEVER WITH SURGERIES YEARS AGO--BUT NO PROBLEMS WITH MORE RECENT SURGERIES--STATES NOT MALIGNANT HYPERTHERMIA  . Postop Hyponatremia 05/14/2012  . Postoperative anemia due to acute blood loss 05/14/2012  . PTSD (post-traumatic stress disorder)   . Right foot drop   . 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    Past Surgical History:  Procedure Laterality Date  . ABDOMINAL HYSTERECTOMY    . ANTERIOR CERVICAL DECOMP/DISCECTOMY FUSION N/A 10/30/2012   Procedure: ACDF C5-6, EXPLORATION AND HARDWARE REMOVAL C6-7;  Surgeon: Melina Schools, MD;  Location: [place];  Service: Orthopedics;  Laterality: N/A;  . ANTERIOR FUSION  CERVICAL SPINE  MAY 2012   AT [facility]  . BACK SURGERY  2009   LUMBAR FUSION WITH RODS   . BREAST BIOPSY Right 2008   benign.- bx/clip  . BREAST EXCISIONAL BIOPSY Left 1998   benign  . BREAST REDUCTION SURGERY Bilateral 06/2016  . CARPAL TUNNEL RELEASE  05-06-12   Right  . CHOLECYSTECTOMY    . COLONOSCOPY WITH PROPOFOL N/A 01/26/2017   Procedure: COLONOSCOPY WITH PROPOFOL;  Surgeon: Lucilla Lame, MD;  Location: [place];  Service: Endoscopy;  Laterality: N/A;  . DIAGNOSTIC LAPAROSCOPIES - MULTIPLE FOR ENDOMETRIOSIS    . ESOPHAGEAL DILATION N/A 01/26/2017   Procedure: ESOPHAGEAL DILATION;  Surgeon: Lucilla Lame, MD;  Location: [place];  Service: Endoscopy;  Laterality: N/A;  . ESOPHAGEAL MANOMETRY N/A 05/30/2017   Procedure: ESOPHAGEAL MANOMETRY (EM);  Surgeon: Lucilla Lame, MD;  Location: [facility];  Service: Endoscopy;  Laterality: N/A;  . ESOPHAGOGASTRODUODENOSCOPY (EGD) WITH PROPOFOL N/A 01/26/2017   Procedure: ESOPHAGOGASTRODUODENOSCOPY (EGD) WITH PROPOFOL;  Surgeon: Lucilla Lame, MD;  Location: [place];  Service: Endoscopy;  Laterality: N/A;  . HIP ARTHROSCOPY  09/20/2011   Procedure: ARTHROSCOPY HIP;  Surgeon: Gearlean Alf, MD;  Location: [facility];  Service: Orthopedics;  Laterality: Right;  Right Hip Scope with Labral Debridement  . NASAL SEPTUM SURGERY  MARCH 2013   IN [place]  . [place] IMPEDANCE STUDY N/A 05/30/2017   Procedure: [place] IMPEDANCE STUDY;  Surgeon: Lucilla Lame, MD;  Location: [facility];  Service: Endoscopy;  Laterality: N/A;  . POLYPECTOMY N/A 01/26/2017   Procedure: POLYPECTOMY;  Surgeon: Lucilla Lame, MD;  Location: [place];  Service: Endoscopy;  Laterality: N/A;  . POSTERIOR CERVICAL FUSION/FORAMINOTOMY N/A 04/16/2013   Procedure: REMOVAL CERVICAL PLATES AND INTERBODY CAGE/POSTERIOR CERVICAL SPINAL FUSION C4 - C6/C5 CORPECTOMY/C4 - C6 FUSION WITH ILIAC CREST BONE GRAFT;  Surgeon: Melina Schools, MD;  Location: [place];   Service: Orthopedics;  Laterality: N/A;  . RIGHT HIP ARTHROSCOPY FOR LABRAL TEAR  ABOUT 2010   2012 also  . SHOULDER ARTHROSCOPY  05-06-12   bone spur  . TONSILLECTOMY     AS A CHILD  . TOTAL HIP ARTHROPLASTY Right 05/13/2012   Procedure: TOTAL HIP ARTHROPLASTY ANTERIOR APPROACH;  Surgeon: Gearlean Alf, MD;  Location: [facility];  Service: Orthopedics;  Laterality: Right;   Family History  Problem Relation Age of Onset  . Aneurysm Mother   . Aneurysm Maternal Grandmother   . Breast cancer Paternal Grandmother 2  . Bipolar disorder Sister   . Bipolar disorder Grandchild   . Anxiety disorder Grandchild   . Depression Grandchild    Social History   Socioeconomic History  . Marital status: Married    Spouse name:  bruce  . Number of children: 2  . Years of education: Not on file  . Highest education level: Associate degree: occupational, Hotel manager, or vocational program  Occupational History    Comment: disabled  Social Needs  . Financial resource strain: Very hard  . Food insecurity:    Worry: Often true    Inability: Often true  . Transportation needs:    Medical: No    Non-medical: No  Tobacco Use  . Smoking status: Never Smoker  . Smokeless tobacco: Never Used  Substance and Sexual Activity  . Alcohol use: No    Alcohol/week: 0.0 standard drinks  . Drug use: No  . Sexual activity: Yes  Lifestyle  . Physical activity:    Days per week: 0 days    Minutes per session: 0 min  . Stress: Very much  Relationships  . Social connections:    Talks on phone: More than three times a week    Gets together: Never    Attends religious service: More than 4 times per year    Active member of club or organization: No    Attends meetings of clubs or organizations: Never    Relationship status: Married  Other Topics Concern  . Not on file  Social History Narrative   Son and granddaughter    Outpatient Encounter Medications as of 12/25/2017  Medication Sig  . albuterol  (PROVENTIL HFA) 108 (90 Base) MCG/ACT inhaler Inhale 1-2 puffs into the lungs every 4 (four) hours as needed.   . ALPRAZolam (XANAX) 1 MG tablet TAKE 1/2 TABLET BY MOUTH EVERY MORNING , 1/2 TABLET AT LUNCH AND TAKE 2 TABLETS AT BEDTIME (Patient taking differently: Take 1 mg by mouth at bedtime. )  . buPROPion (WELLBUTRIN XL) 300 MG 24 hr tablet TAKE 1 TABLET BY MOUTH EVERY DAY  . busPIRone (BUSPAR) 5 MG tablet TAKE 1 TABLET BY MOUTH TWICE A DAY  . dexlansoprazole (DEXILANT) 60 MG capsule Take 1 capsule (60 mg total) by mouth daily.  Mariane Baumgarten Sodium (COLACE PO) Take by mouth daily.   . DULoxetine (CYMBALTA) 60 MG capsule Take 60 mg by mouth daily.  . metoprolol succinate (TOPROL-XL) 50 MG 24 hr tablet TAKE 1 TABLET BY MOUTH EVERY DAY  . montelukast (SINGULAIR) 10 MG tablet TAKE 1 TABLET (10 MG TOTAL) BY MOUTH DAILY.  Marland Kitchen ondansetron (ZOFRAN) 4 MG tablet Take 1 tablet (4 mg total) by mouth every 8 (eight) hours as needed for nausea or vomiting.  . prazosin (MINIPRESS) 1 MG capsule Take 2 mg by mouth at bedtime.  Marland Kitchen Spacer/Aero-Holding Chambers (AEROCHAMBER MV) inhaler by Does not apply route.  . valACYclovir (VALTREX) 1000 MG tablet TAKE TWO TABLETS BY MOUTH TWICE DAILY FOR ONE DAY FEVER BLISTER  . DULoxetine (CYMBALTA) 30 MG capsule TAKE 1 CAPSULE BY MOUTH EVERY DAY  . traZODone (DESYREL) 50 MG tablet TAKE 1-1.5 TABLETS (50-75 MG TOTAL) BY MOUTH AT BEDTIME AS NEEDED FOR SLEEP. (Patient not taking: Reported on 12/25/2017)   No facility-administered encounter medications on file as of 12/25/2017.     Activities of Daily Living In your present state of health, do you have any difficulty performing the following activities: 12/25/2017 01/26/2017  Hearing? N N  Vision? Y N  Comment Needs an eye exam; referral to [facility] sent.  -  Difficulty concentrating or making decisions? N N  Walking or climbing stairs? Y N  Comment Due to chronic pain.  -  Dressing or bathing? N N  Doing errands,  shopping? N -    Preparing Food and eating ? N -  Using the Toilet? N -  In the past six months, have you accidently leaked urine? N -  Do you have problems with loss of bowel control? N -  Managing your Medications? N -  Managing your Finances? N -  Housekeeping or managing your Housekeeping? Y -  Comment Husband assists with cleaning.  -  Some recent data might be hidden    Patient Care Team: Virginia Crews, MD as PCP - General (Family Medicine) Dorann Ou Montine Circle, NP as Nurse Practitioner (Psychiatry)    Assessment:   This is a routine wellness examination for [place].  Exercise Activities and Dietary recommendations Current Exercise Habits: Home exercise routine, Type of exercise: walking, Time (Minutes): 30, Frequency (Times/Week): 4, Weekly Exercise (Minutes/Week): 120, Intensity: Mild, Exercise limited by: orthopedic condition(s)  Goals    . Increase water intake     Recommend increasing fluid intake to 4-6 glasses each day       Fall Risk Fall Risk  12/25/2017 08/21/2017 05/21/2017 02/26/2017 02/13/2017  Falls in the past year? Yes No No No No  Comment - - - - -  Number falls in past yr: 2 or more - - - -  Comment - - - - -  Injury with Fall? - - - - -  Comment - - - - -  Risk Factor Category  - - - - -  Comment - - - - -  Risk for fall due to : Impaired balance/gait - - - -  Risk for fall due to: Comment - - - - -  Follow up Falls prevention discussed - - - -  Comment - - - - -   FALL RISK PREVENTION PERTAINING TO THE HOME:  Any stairs in or around the home WITH handrails? Yes  Home free of loose throw rugs in walkways, pet beds, electrical cords, etc? Yes  Adequate lighting in your home to reduce risk of falls? Yes   ASSISTIVE DEVICES UTILIZED TO PREVENT FALLS:  Life alert? No  Use of a cane, walker or w/c? Yes  Grab bars in the bathroom? No  Shower chair or bench in shower? No  Elevated toilet seat or a handicapped toilet? No   DME ORDERS:  DME order needed?   No   TIMED UP AND GO:  Was the test performed? No .    Depression Screen PHQ 2/9 Scores 12/25/2017 08/21/2017 05/21/2017 03/15/2017  PHQ - 2 Score 4 0 6 3  PHQ- 9 Score 10 - 16 13  Exception Documentation - - - -  Not completed - - - -     Cognitive Function : Declined screening.      6CIT Screen 12/07/2016  What Year? 0 points  What month? 0 points  What time? 0 points  Count back from 20 0 points  Months in reverse 0 points  Repeat phrase 0 points  Total Score 0    Immunization History  Administered Date(s) Administered  . Influenza,inj,Quad PF,6+ Mos 12/30/2015, 12/07/2016, 12/25/2017  . Pneumococcal Polysaccharide-23 12/30/2015    Qualifies for Shingles Vaccine? N/A  Tdap: Although this vaccine is not a covered service during a Wellness Exam, does the patient still wish to receive this vaccine today?  No .  Education has been provided regarding the importance of this vaccine. Advised may receive this vaccine at local pharmacy or Health Dept. Aware to provide a copy of the  vaccination record if obtained from local pharmacy or Health Dept. Verbalized acceptance and understanding.  Flu Vaccine: Due for Flu vaccine. Does the patient want to receive this vaccine today?  Yes .   Pneumococcal Vaccine: N/A   Screening Tests Health Maintenance  Topic Date Due  . TETANUS/TDAP  06/01/1978  . PAP SMEAR  05/31/1980  . MAMMOGRAM  02/16/2018  . COLONOSCOPY  01/26/2022  . INFLUENZA VACCINE  Completed  . Hepatitis C Screening  Completed  . HIV Screening  Completed    Cancer Screenings:  Colorectal Screening: Completed 01/26/17. Repeat every 5 years.  Mammogram: Completed 02/17/16. Repeat every year; ordered 06/18/17. Pt to set up apt soon.   Bone Density: N/A  Lung Cancer Screening: (Low Dose CT Chest recommended if Age 20-80 years, 30 pack-year currently smoking OR have quit w/in 15years.) does not qualify.    Additional Screening:  Hepatitis C Screening: Up to  date  Vision Screening: Recommended annual ophthalmology exams for early detection of glaucoma and other disorders of the eye.  Dental Screening: Recommended annual dental exams for proper oral hygiene  Community Resource Referral:  CRR required this visit?  No       Plan:  I have personally reviewed and addressed the Medicare Annual Wellness questionnaire and have noted the following in the patient's chart:  A. Medical and social history B. Use of alcohol, tobacco or illicit drugs  C. Current medications and supplements D. Functional ability and status E.  Nutritional status F.  Physical activity G. Advance directives H. List of other physicians I.  Hospitalizations, surgeries, and ER visits in previous 12 months J.  [place] such as hearing and vision if needed, cognitive and depression L. Referrals and appointments - none  In addition, I have reviewed and discussed with patient certain preventive protocols, quality metrics, and best practice recommendations. A written personalized care plan for preventive services as well as general preventive health recommendations were provided to patient.  See attached scanned questionnaire for additional information.   Signed,  Fabio Neighbors, LPN Nurse Health Advisor   Nurse Recommendations: Pt to set up a mammogram in the near future. Pt declined the tetanus vaccine today.

## 2017-12-26 LAB — LIPID PANEL
Chol/HDL Ratio: 3.2 ratio (ref 0.0–4.4)
Cholesterol, Total: 217 mg/dL — ABNORMAL HIGH (ref 100–199)
HDL: 68 mg/dL (ref >39)
LDL Calculated: 123 mg/dL — ABNORMAL HIGH (ref 0–99)
Triglycerides: 131 mg/dL (ref 0–149)
VLDL CHOLESTEROL CAL: 26 mg/dL (ref 5–40)

## 2017-12-26 LAB — CBC WITH DIFFERENTIAL/PLATELET
BASOS: 1 %
Basophils Absolute: 0.1 10*3/uL (ref 0.0–0.2)
EOS (ABSOLUTE): 0.1 10*3/uL (ref 0.0–0.4)
Eos: 1 %
HEMOGLOBIN: 12.1 g/dL (ref 11.1–15.9)
Hematocrit: 37.1 % (ref 34.0–46.6)
IMMATURE GRANS (ABS): 0 10*3/uL (ref 0.0–0.1)
Immature Granulocytes: 0 %
LYMPHS: 30 %
Lymphocytes Absolute: 2 10*3/uL (ref 0.7–3.1)
MCH: 26.9 pg (ref 26.6–33.0)
MCHC: 32.6 g/dL (ref 31.5–35.7)
MCV: 83 fL (ref 79–97)
Monocytes Absolute: 0.3 10*3/uL (ref 0.1–0.9)
Monocytes: 4 %
NEUTROS ABS: 4.2 10*3/uL (ref 1.4–7.0)
Neutrophils: 64 %
Platelets: 426 10*3/uL (ref 150–450)
RBC: 4.49 x10E6/uL (ref 3.77–5.28)
RDW: 13.5 % (ref 12.3–15.4)
WBC: 6.6 10*3/uL (ref 3.4–10.8)

## 2017-12-26 LAB — COMPREHENSIVE METABOLIC PANEL
A/G RATIO: 1.7 (ref 1.2–2.2)
ALBUMIN: 4.3 g/dL (ref 3.5–5.5)
ALT: 9 IU/L (ref 0–32)
AST: 13 IU/L (ref 0–40)
Alkaline Phosphatase: 138 IU/L — ABNORMAL HIGH (ref 39–117)
BUN / CREAT RATIO: 10 (ref 9–23)
BUN: 8 mg/dL (ref 6–24)
Bilirubin Total: 0.4 mg/dL (ref 0.0–1.2)
CO2: 23 mmol/L (ref 20–29)
Calcium: 9.7 mg/dL (ref 8.7–10.2)
Chloride: 102 mmol/L (ref 96–106)
Creatinine, Ser: 0.79 mg/dL (ref 0.57–1.00)
GFR, EST AFRICAN AMERICAN: 95 mL/min/{1.73_m2} (ref >59)
GFR, EST NON AFRICAN AMERICAN: 83 mL/min/{1.73_m2} (ref >59)
GLOBULIN, TOTAL: 2.6 g/dL (ref 1.5–4.5)
Glucose: 94 mg/dL (ref 65–99)
POTASSIUM: 4.4 mmol/L (ref 3.5–5.2)
Sodium: 140 mmol/L (ref 134–144)
TOTAL PROTEIN: 6.9 g/dL (ref 6.0–8.5)

## 2017-12-26 LAB — TSH: TSH: 1.8 u[IU]/mL (ref 0.450–4.500)

## 2018-01-01 ENCOUNTER — Ambulatory Visit: Payer: Medicare Other

## 2018-01-02 ENCOUNTER — Other Ambulatory Visit: Payer: Self-pay | Admitting: Psychiatry

## 2018-01-02 DIAGNOSIS — F411 Generalized anxiety disorder: Secondary | ICD-10-CM

## 2018-01-02 DIAGNOSIS — F331 Major depressive disorder, recurrent, moderate: Secondary | ICD-10-CM

## 2018-01-31 ENCOUNTER — Other Ambulatory Visit: Payer: Self-pay | Admitting: Family Medicine

## 2018-01-31 DIAGNOSIS — F411 Generalized anxiety disorder: Secondary | ICD-10-CM

## 2018-02-18 ENCOUNTER — Encounter: Payer: Self-pay | Admitting: Family Medicine

## 2018-02-18 ENCOUNTER — Ambulatory Visit (INDEPENDENT_AMBULATORY_CARE_PROVIDER_SITE_OTHER): Payer: Medicare Other | Admitting: Family Medicine

## 2018-02-18 ENCOUNTER — Telehealth: Payer: Self-pay

## 2018-02-18 VITALS — BP 137/86 | HR 71 | Temp 97.7°F | Wt 157.4 lb

## 2018-02-18 DIAGNOSIS — N3091 Cystitis, unspecified with hematuria: Secondary | ICD-10-CM | POA: Diagnosis not present

## 2018-02-18 LAB — POCT URINALYSIS DIPSTICK
GLUCOSE UA: NEGATIVE
KETONES UA: NEGATIVE
NITRITE UA: NEGATIVE
Protein, UA: NEGATIVE
SPEC GRAV UA: 1.01 (ref 1.010–1.025)
Urobilinogen, UA: 0.2 E.U./dL
pH, UA: 6 (ref 5.0–8.0)

## 2018-02-18 MED ORDER — CEPHALEXIN 500 MG PO CAPS: 500.0000 mg | ORAL_CAPSULE | Freq: Two times a day (BID) | ORAL | 0 refills | Status: AC

## 2018-02-18 NOTE — Progress Notes (Signed)
Patient: [name] Female    DOB: 1960-10-14   57 y.o.   MRN: [id number] Visit Date: 02/18/2018  Today's Provider: Lavon Paganini, MD   Chief Complaint  Patient presents with  . Urinary Tract Infection   Subjective:    HPI  Urinary Tract Infection Patient presents today for a possible UTI for about 4 days now. Patient states she is having abdominal cramps/pain, back pain, and frequency to urination. Patient also states she had bleeding once she would wipe with tissue on yesterday 02/18/2018. Patient states she told AZO without dye in it.    Allergies  Allergen Reactions  . Amoxicillin Hives  . Chlorhexidine Gluconate Dermatitis and Hives  . Clindamycin Hives  . Codeine Hives  . Erythromycin Hives    "mycins" in general  . Penicillin G Hives    "cillins" in general  . Sulfa Antibiotics Nausea And Vomiting and Hives       . Levofloxacin Hives  . Shellfish Allergy Hives  . Decadron [Dexamethasone] Other (See Comments)    Hot flashes, insomnia, "manic" Hot flashes, insomnia, "manic"  . Mangifera Indica Hives    papaya  . Papaya Derivatives Hives  . Betadine [Povidone Iodine] Hives       . Chlorhexidine Hives       . Clarithromycin Hives  . Clindamycin/Lincomycin Hives  . Other Hives     Mango   . Povidone-Iodine Hives  . Prednisone Anxiety    High blood pressure, flushed, mood changes, heart palpitations High blood pressure, flushed, mood changes, heart palpitations     Current Outpatient Medications:  .  albuterol (PROVENTIL HFA) 108 (90 Base) MCG/ACT inhaler, Inhale 1-2 puffs into the lungs every 4 (four) hours as needed. , Disp: , Rfl:  .  ALPRAZolam (XANAX) 1 MG tablet, TAKE 1/2 TABLET BY MOUTH EVERY MORNING , 1/2 TABLET AT LUNCH AND TAKE 2 TABLETS AT BEDTIME, Disp: 90 tablet, Rfl: 2 .  buPROPion (WELLBUTRIN XL) 300 MG 24 hr tablet, TAKE 1 TABLET BY MOUTH EVERY DAY, Disp: 90 tablet, Rfl: 2 .  busPIRone (BUSPAR) 5 MG tablet, TAKE 1 TABLET BY  MOUTH TWICE A DAY, Disp: 60 tablet, Rfl: 1 .  dexlansoprazole (DEXILANT) 60 MG capsule, Take 1 capsule (60 mg total) by mouth daily., Disp: 30 capsule, Rfl: 11 .  Docusate Sodium (COLACE PO), Take by mouth daily. , Disp: , Rfl:  .  DULoxetine (CYMBALTA) 30 MG capsule, TAKE 1 CAPSULE BY MOUTH EVERY DAY, Disp: 30 capsule, Rfl: 1 .  DULoxetine (CYMBALTA) 60 MG capsule, Take 60 mg by mouth daily., Disp: , Rfl:  .  metoprolol succinate (TOPROL-XL) 50 MG 24 hr tablet, TAKE 1 TABLET BY MOUTH EVERY DAY, Disp: 90 tablet, Rfl: 1 .  montelukast (SINGULAIR) 10 MG tablet, TAKE 1 TABLET (10 MG TOTAL) BY MOUTH DAILY., Disp: 90 tablet, Rfl: 1 .  ondansetron (ZOFRAN) 4 MG tablet, Take 1 tablet (4 mg total) by mouth every 8 (eight) hours as needed for nausea or vomiting., Disp: 30 tablet, Rfl: 0 .  prazosin (MINIPRESS) 1 MG capsule, Take 2 mg by mouth at bedtime., Disp: , Rfl:  .  Spacer/Aero-Holding Chambers (AEROCHAMBER MV) inhaler, by Does not apply route., Disp: , Rfl:  .  traZODone (DESYREL) 50 MG tablet, TAKE 1-1.5 TABLETS (50-75 MG TOTAL) BY MOUTH AT BEDTIME AS NEEDED FOR SLEEP., Disp: 45 tablet, Rfl: 1 .  valACYclovir (VALTREX) 1000 MG tablet, TAKE TWO TABLETS BY MOUTH TWICE DAILY FOR  ONE DAY FEVER BLISTER, Disp: 4 tablet, Rfl: 5  Review of Systems  Constitutional: Negative.   HENT: Negative.   Respiratory: Negative.   Gastrointestinal: Positive for abdominal pain.  Genitourinary: Positive for dysuria and frequency.  Musculoskeletal: Positive for back pain.    Social History   Tobacco Use  . Smoking status: Never Smoker  . Smokeless tobacco: Never Used  Substance Use Topics  . Alcohol use: No    Alcohol/week: 0.0 standard drinks   Objective:   There were no vitals taken for this visit. There were no vitals filed for this visit.   Physical Exam  Constitutional: She is oriented to person, place, and time. She appears well-developed and well-nourished. No distress.  HENT:  Head:  Normocephalic and atraumatic.  Eyes: Conjunctivae are normal. No scleral icterus.  Cardiovascular: Normal rate, regular rhythm, normal heart sounds and intact distal pulses.  No murmur heard. Pulmonary/Chest: Effort normal and breath sounds normal. No respiratory distress. She has no wheezes. She has no rales.  Abdominal: Soft. She exhibits no distension. There is tenderness in the right lower quadrant, periumbilical area, suprapubic area and left lower quadrant. There is no rebound, no guarding and no CVA tenderness.  Musculoskeletal: She exhibits no edema.  Neurological: She is alert and oriented to person, place, and time.  Skin: Skin is warm and dry. Capillary refill takes less than 2 seconds. No rash noted.  Psychiatric: She has a normal mood and affect. Her behavior is normal.  Vitals reviewed.   Results for orders placed or performed in visit on 02/18/18  POCT urinalysis dipstick  Result Value Ref Range   Color, UA clear    Clarity, UA     Glucose, UA Negative Negative   Bilirubin, UA Small+    Ketones, UA Negative    Spec Grav, UA 1.010 1.010 - 1.025   Blood, UA Moderate    pH, UA 6.0 5.0 - 8.0   Protein, UA Negative Negative   Urobilinogen, UA 0.2 0.2 or 1.0 E.U./dL   Nitrite, UA Negative    Leukocytes, UA Large (3+) (A) Negative   Appearance     Odor         Assessment & Plan:   1. Cystitis with hematuria - Symptoms and UA consistent with UTI -No systemic symptoms or signs of pyelonephritis - Given hematuria, will send urine micro to confirm and will plan to recheck urine in about 6 weeks after completion of antibiotics to ensure hematuria has cleared -Will start treatment with 5day course of Keflex  -We will send urine culture to confirm sensitivities -Discussed return precautions - POCT urinalysis dipstick - Urine Culture - Urine Microscopic    Meds ordered this encounter  Medications  . cephALEXin (KEFLEX) 500 MG capsule    Sig: Take 1 capsule (500 mg  total) by mouth 2 (two) times daily for 5 days.    Dispense:  10 capsule    Refill:  0     Return in about 6 weeks (around 04/01/2018) for repeat UA for hematuria.   The entirety of the information documented in the History of Present Illness, Review of Systems and Physical Exam were personally obtained by me. Portions of this information were initially documented by Tiburcio Pea, CMA and reviewed by me for thoroughness and accuracy.    Virginia Crews, MD, MPH [facility] 02/20/2018 8:44 AM

## 2018-02-18 NOTE — Patient Instructions (Signed)
   Hematuria, Adult Hematuria is blood in your urine. It can be caused by a bladder infection, kidney infection, prostate infection, kidney stone, or cancer of your urinary tract. Infections can usually be treated with medicine, and a kidney stone usually will pass through your urine. If neither of these is the cause of your hematuria, further workup to find out the reason may be needed. It is very important that you tell your health care provider about any blood you see in your urine, even if the blood stops without treatment or happens without causing pain. Blood in your urine that happens and then stops and then happens again can be a symptom of a very serious condition. Also, pain is not a symptom in the initial stages of many urinary cancers. Follow these instructions at home:  Drink lots of fluid, 3-4 quarts a day. If you have been diagnosed with an infection, cranberry juice is especially recommended, in addition to large amounts of water.  Avoid caffeine, tea, and carbonated beverages because they tend to irritate the bladder.  Avoid alcohol because it may irritate the prostate.  Take all medicines as directed by your health care provider.  If you were prescribed an antibiotic medicine, finish it all even if you start to feel better.  If you have been diagnosed with a kidney stone, follow your health care provider's instructions regarding straining your urine to catch the stone.  Empty your bladder often. Avoid holding urine for long periods of time.  After a bowel movement, women should cleanse front to back. Use each tissue only once.  Empty your bladder before and after sexual intercourse if you are a female. Contact a health care provider if:  You develop back pain.  You have a fever.  You have a feeling of sickness in your stomach (nausea) or vomiting.  Your symptoms are not better in 3 days. Return sooner if you are getting worse. Get help right away if:  You develop  severe vomiting and are unable to keep the medicine down.  You develop severe back or abdominal pain despite taking your medicines.  You begin passing a large amount of blood or clots in your urine.  You feel extremely weak or faint, or you pass out. This information is not intended to replace advice given to you by your health care provider. Make sure you discuss any questions you have with your health care provider. Document Released: 02/27/2005 Document Revised: 08/05/2015 Document Reviewed: 10/28/2012 Elsevier Interactive Patient Education  2017 [employer]  

## 2018-02-18 NOTE — Telephone Encounter (Signed)
Patient calling with cramping since Friday and painful urination. Reports that she has been seeing blood clots when she wipes. Patient was scheduled with Dr.B this afternoon.Patient has taken AZO the one that doesn't turn her urine orange.  Thanks,  -Joseline

## 2018-02-19 LAB — URINALYSIS, MICROSCOPIC ONLY: Casts: NONE SEEN /lpf

## 2018-02-20 LAB — URINE CULTURE

## 2018-02-22 ENCOUNTER — Telehealth: Payer: Self-pay

## 2018-02-22 MED ORDER — NITROFURANTOIN MONOHYD MACRO 100 MG PO CAPS: 100.0000 mg | ORAL_CAPSULE | Freq: Two times a day (BID) | ORAL | 0 refills | Status: DC

## 2018-02-22 MED ORDER — NITROFURANTOIN MONOHYD MACRO 100 MG PO CAPS: 100.0000 mg | ORAL_CAPSULE | Freq: Two times a day (BID) | ORAL | 0 refills | Status: AC

## 2018-02-22 NOTE — Telephone Encounter (Signed)
LEt's try Macrobid x5d.  We are significantly limited by her allergies, but culture showed that it was sensitive to this

## 2018-02-22 NOTE — Telephone Encounter (Signed)
[facility] advised[facility]  

## 2018-02-22 NOTE — Telephone Encounter (Signed)
Patient called saying that she was treated for a UTI on 02/18/18 with Keflex. She has 1 pill left and she still has symptoms. Patient is requesting a different abx to be sent into the pharmacy[facility] [place]. Contact info is correct. Thanks!

## 2018-02-26 ENCOUNTER — Telehealth: Payer: Self-pay | Admitting: Family Medicine

## 2018-02-26 ENCOUNTER — Other Ambulatory Visit: Payer: Self-pay

## 2018-02-26 ENCOUNTER — Ambulatory Visit
Admission: EM | Admit: 2018-02-26 | Discharge: 2018-02-26 | Disposition: A | Discharge status: Home / Self Care | Payer: Medicare Other | Attending: Family Medicine | Admitting: Family Medicine

## 2018-02-26 DIAGNOSIS — T23192A Burn of first degree of multiple sites of left wrist and hand, initial encounter: Secondary | ICD-10-CM | POA: Insufficient documentation

## 2018-02-26 DIAGNOSIS — X150XXA Contact with hot stove (kitchen), initial encounter: Secondary | ICD-10-CM

## 2018-02-26 MED ORDER — FLUCONAZOLE 150 MG PO TABS: 150.0000 mg | ORAL_TABLET | Freq: Once | ORAL | 0 refills | Status: AC

## 2018-02-26 MED ORDER — SILVER SULFADIAZINE 1 % EX CREA: 1.0000 "application " | TOPICAL_CREAM | Freq: Two times a day (BID) | CUTANEOUS | 0 refills | Status: DC
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
## 2018-02-26 NOTE — Telephone Encounter (Signed)
Patient was advised.  
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
## 2018-02-26 NOTE — Telephone Encounter (Signed)
Patient states that she is on her second antibiotic and has a yeast infection.  She would like to know if you will send in diflucan for her.  She states that this is what works for her.  [facility] [place]

## 2018-02-26 NOTE — Telephone Encounter (Signed)
Rx sent 

## 2018-04-24 ENCOUNTER — Other Ambulatory Visit: Payer: Self-pay | Admitting: Family Medicine

## 2018-04-24 DIAGNOSIS — F411 Generalized anxiety disorder: Secondary | ICD-10-CM

## 2018-05-10 ENCOUNTER — Ambulatory Visit
Admission: RE | Admit: 2018-05-10 | Discharge: 2018-05-10 | Disposition: A | Discharge status: Home / Self Care | Payer: Medicare Other | Attending: Family Medicine | Admitting: Family Medicine

## 2018-05-10 ENCOUNTER — Other Ambulatory Visit: Payer: Self-pay | Admitting: Family Medicine

## 2018-05-10 ENCOUNTER — Ambulatory Visit (INDEPENDENT_AMBULATORY_CARE_PROVIDER_SITE_OTHER): Payer: Medicare Other | Admitting: Family Medicine

## 2018-05-10 ENCOUNTER — Encounter: Payer: Self-pay | Admitting: Family Medicine

## 2018-05-10 ENCOUNTER — Ambulatory Visit
Admission: RE | Admit: 2018-05-10 | Discharge: 2018-05-10 | Disposition: A | Discharge status: Home / Self Care | Payer: Medicare Other | Source: Ambulatory Visit | Attending: Family Medicine | Admitting: Family Medicine

## 2018-05-10 VITALS — BP 111/73 | HR 66 | Temp 98.4°F | Wt 157.8 lb

## 2018-05-10 DIAGNOSIS — R05 Cough: Secondary | ICD-10-CM | POA: Insufficient documentation

## 2018-05-10 DIAGNOSIS — R079 Chest pain, unspecified: Secondary | ICD-10-CM | POA: Diagnosis not present

## 2018-05-10 DIAGNOSIS — R059 Cough, unspecified: Secondary | ICD-10-CM

## 2018-05-10 DIAGNOSIS — B001 Herpesviral vesicular dermatitis: Secondary | ICD-10-CM

## 2018-05-10 DIAGNOSIS — J4540 Moderate persistent asthma, uncomplicated: Secondary | ICD-10-CM | POA: Insufficient documentation

## 2018-05-10 MED ORDER — VALACYCLOVIR HCL 1 G PO TABS: ORAL_TABLET | ORAL | 5 refills | Status: DC

## 2018-05-10 MED ORDER — HYDROCOD POLST-CPM POLST ER 10-8 MG/5ML PO SUER: 5.0000 mL | Freq: Two times a day (BID) | ORAL | 0 refills | Status: DC | PRN

## 2018-05-10 NOTE — Progress Notes (Signed)
Patient: [name] Female    DOB: April 17, 1960   58 y.o.   MRN: [id number] Visit Date: 05/11/2018  Today's Provider: Lavon Paganini, MD   Chief Complaint  Patient presents with  . URI   Subjective:     URI   The current episode started in the past 7 days. The problem has been gradually worsening. There has been no fever. Associated symptoms include congestion, coughing, headaches, sinus pain, sneezing, a sore throat and wheezing. She has tried NSAIDs, acetaminophen and increased fluids for the symptoms. The treatment provided mild relief.  Patient states she has ulcer in the front of her mouth upper lip.  7 days, initially with sinus congestion Worried that she aspirated Cough bad  Allergies  Allergen Reactions  . Amoxicillin Hives  . Chlorhexidine Gluconate Dermatitis and Hives  . Clindamycin Hives  . Codeine Hives  . Erythromycin Hives    "mycins" in general  . Penicillin G Hives    "cillins" in general  . Sulfa Antibiotics Nausea And Vomiting and Hives       . Levofloxacin Hives  . Shellfish Allergy Hives  . Decadron [Dexamethasone] Other (See Comments)    Hot flashes, insomnia, "manic" Hot flashes, insomnia, "manic"  . Mangifera Indica Hives    papaya  . Papaya Derivatives Hives  . Betadine [Povidone Iodine] Hives       . Chlorhexidine Hives       . Clarithromycin Hives  . Clindamycin/Lincomycin Hives  . Other Hives     Mango   . Povidone-Iodine Hives  . Prednisone Anxiety    High blood pressure, flushed, mood changes, heart palpitations High blood pressure, flushed, mood changes, heart palpitations     Current Outpatient Medications:  .  albuterol (PROVENTIL HFA) 108 (90 Base) MCG/ACT inhaler, Inhale 1-2 puffs into the lungs every 4 (four) hours as needed. , Disp: , Rfl:  .  ALPRAZolam (XANAX) 1 MG tablet, TAKE 1/2 TABLET BY MOUTH EVERY MORNING , 1/2 TABLET AT LUNCH AND TAKE 2 TABLETS AT BEDTIME, Disp: 90 tablet, Rfl: 2 .  buPROPion  (WELLBUTRIN XL) 300 MG 24 hr tablet, TAKE 1 TABLET BY MOUTH EVERY DAY, Disp: 90 tablet, Rfl: 2 .  busPIRone (BUSPAR) 5 MG tablet, TAKE 1 TABLET BY MOUTH TWICE A DAY, Disp: 60 tablet, Rfl: 1 .  dexlansoprazole (DEXILANT) 60 MG capsule, Take 1 capsule (60 mg total) by mouth daily., Disp: 30 capsule, Rfl: 11 .  Docusate Sodium (COLACE PO), Take by mouth daily. , Disp: , Rfl:  .  DULoxetine (CYMBALTA) 30 MG capsule, TAKE 1 CAPSULE BY MOUTH EVERY DAY, Disp: 30 capsule, Rfl: 1 .  DULoxetine (CYMBALTA) 60 MG capsule, Take 60 mg by mouth daily., Disp: , Rfl:  .  Fluticasone-Salmeterol (ADVAIR) 500-50 MCG/DOSE AEPB, Inhale 1 puff into the lungs 2 (two) times daily., Disp: , Rfl:  .  metoprolol succinate (TOPROL-XL) 50 MG 24 hr tablet, TAKE 1 TABLET BY MOUTH EVERY DAY, Disp: 90 tablet, Rfl: 1 .  montelukast (SINGULAIR) 10 MG tablet, TAKE 1 TABLET (10 MG TOTAL) BY MOUTH DAILY., Disp: 90 tablet, Rfl: 1 .  ondansetron (ZOFRAN) 4 MG tablet, Take 1 tablet (4 mg total) by mouth every 8 (eight) hours as needed for nausea or vomiting., Disp: 30 tablet, Rfl: 0 .  silver sulfADIAZINE (SILVADENE) 1 % cream, Apply 1 application topically 2 (two) times daily., Disp: 50 g, Rfl: 0 .  Spacer/Aero-Holding Chambers (AEROCHAMBER MV) inhaler, by Does not  apply route., Disp: , Rfl:  .  valACYclovir (VALTREX) 1000 MG tablet, TAKE TWO TABLETS BY MOUTH TWICE DAILY FOR ONE DAY FEVER BLISTER, Disp: 4 tablet, Rfl: 5 .  chlorpheniramine-HYDROcodone (TUSSIONEX PENNKINETIC ER) 10-8 MG/5ML SUER, Take 5 mLs by mouth every 12 (twelve) hours as needed for cough., Disp: 115 mL, Rfl: 0 .  prazosin (MINIPRESS) 1 MG capsule, Take 2 mg by mouth at bedtime., Disp: , Rfl:  .  traZODone (DESYREL) 50 MG tablet, TAKE 1-1.5 TABLETS (50-75 MG TOTAL) BY MOUTH AT BEDTIME AS NEEDED FOR SLEEP. (Patient not taking: Reported on 05/10/2018), Disp: 45 tablet, Rfl: 1  Review of Systems  Constitutional: Positive for appetite change, chills and fatigue.  HENT:  Positive for congestion, sinus pressure, sinus pain, sneezing and sore throat.   Respiratory: Positive for cough, chest tightness, shortness of breath and wheezing.   Cardiovascular: Negative.   Musculoskeletal: Negative.   Neurological: Positive for light-headedness and headaches.    Social History   Tobacco Use  . Smoking status: Never Smoker  . Smokeless tobacco: Never Used  Substance Use Topics  . Alcohol use: No    Alcohol/week: 0.0 standard drinks      Objective:   BP 111/73 (BP Location: Left Arm, Patient Position: Sitting, Cuff Size: Normal)   Pulse 66   Temp 98.4 F (36.9 C) (Oral)   Wt 157 lb 12.8 oz (71.6 kg)   BMI 27.09 kg/m  Vitals:   05/10/18 1502  BP: 111/73  Pulse: 66  Temp: 98.4 F (36.9 C)  TempSrc: Oral  Weight: 157 lb 12.8 oz (71.6 kg)     Physical Exam Vitals signs reviewed.  Constitutional:      General: She is not in acute distress.    Appearance: Normal appearance. She is not diaphoretic.  HENT:     Head: Normocephalic and atraumatic.     Right Ear: Tympanic membrane, ear canal and external ear normal.     Left Ear: Tympanic membrane, ear canal and external ear normal.     Nose: Congestion present.     Mouth/Throat:     Mouth: Mucous membranes are moist.     Pharynx: Oropharynx is clear. No oropharyngeal exudate or posterior oropharyngeal erythema.     Comments: Small cold sore on upper lip Eyes:     General: No scleral icterus.    Conjunctiva/sclera: Conjunctivae normal.     Pupils: Pupils are equal, round, and reactive to light.  Neck:     Musculoskeletal: Neck supple.  Cardiovascular:     Rate and Rhythm: Normal rate and regular rhythm.     Pulses: Normal pulses.     Heart sounds: Normal heart sounds. No murmur.  Pulmonary:     Effort: Pulmonary effort is normal. No respiratory distress.     Breath sounds: Wheezing and rhonchi present.     Comments: Scattered wheezes and rhonchi Abdominal:     General: There is no  distension.     Palpations: Abdomen is soft.     Tenderness: There is no abdominal tenderness.  Musculoskeletal:     Right lower leg: No edema.     Left lower leg: No edema.  Lymphadenopathy:     Cervical: No cervical adenopathy.  Skin:    General: Skin is warm and dry.     Capillary Refill: Capillary refill takes less than 2 seconds.     Findings: No rash.  Neurological:     Mental Status: She is alert and oriented to person,  place, and time. Mental status is at baseline.  Psychiatric:        Mood and Affect: Mood normal.        Behavior: Behavior normal.         Assessment & Plan   1. Cough -Given persistence of cough with possible history of aspiration, will obtain chest x-ray -Given known history of asthma and scattered wheezes, would likely benefit from prednisone, but patient declines at this time -She should continue inhaler therapy  no evidence of strep pharyngitis, CAP, AOM, bacterial sinusitis, or other bacterial infection - discussed symptomatic management, natural course, and return precautions  -Can use prescription cough syrup as needed - DG Chest 2 View; Future  2. Fever blister - Recurrent problem -Refill of Valtrex given - valACYclovir (VALTREX) 1000 MG tablet; TAKE TWO TABLETS BY MOUTH TWICE DAILY FOR ONE DAY FEVER BLISTER  Dispense: 4 tablet; Refill: 5  3. Moderate persistent reactive airway disease without complication -Currently with increased wheezing and coughing with possible exacerbation -Offered prednisone burst, but patient declines    Meds ordered this encounter  Medications  . valACYclovir (VALTREX) 1000 MG tablet    Sig: TAKE TWO TABLETS BY MOUTH TWICE DAILY FOR ONE DAY FEVER BLISTER    Dispense:  4 tablet    Refill:  5  . chlorpheniramine-HYDROcodone (TUSSIONEX PENNKINETIC ER) 10-8 MG/5ML SUER    Sig: Take 5 mLs by mouth every 12 (twelve) hours as needed for cough.    Dispense:  115 mL    Refill:  0     Return if symptoms worsen  or fail to improve.   The entirety of the information documented in the History of Present Illness, Review of Systems and Physical Exam were personally obtained by me. Portions of this information were initially documented by Tiburcio Pea, CMA and reviewed by me for thoroughness and accuracy.    Virginia Crews, MD, MPH [facility] 05/11/2018 10:41 AM

## 2018-05-10 NOTE — Patient Instructions (Signed)
Cough, Adult  Coughing is a reflex that clears your throat and your airways. Coughing helps to heal and protect your lungs. It is normal to cough occasionally, but a cough that happens with other symptoms or lasts a long time may be a sign of a condition that needs treatment. A cough may last only 2-3 weeks (acute), or it may last longer than 8 weeks (chronic). What are the causes? Coughing is commonly caused by:  Breathing in substances that irritate your lungs.  A viral or bacterial respiratory infection.  Allergies.  Asthma.  Postnasal drip.  Smoking.  Acid backing up from the stomach into the esophagus (gastroesophageal reflux).  Certain medicines.  Chronic lung problems, including COPD (or rarely, lung cancer).  Other medical conditions such as heart failure. Follow these instructions at home: Pay attention to any changes in your symptoms. Take these actions to help with your discomfort:  Take medicines only as told by your health care provider. ? If you were prescribed an antibiotic medicine, take it as told by your health care provider. Do not stop taking the antibiotic even if you start to feel better. ? Talk with your health care provider before you take a cough suppressant medicine.  Drink enough fluid to keep your urine clear or pale yellow.  If the air is dry, use a cold steam vaporizer or humidifier in your bedroom or your home to help loosen secretions.  Avoid anything that causes you to cough at work or at home.  If your cough is worse at night, try sleeping in a semi-upright position.  Avoid cigarette smoke. If you smoke, quit smoking. If you need help quitting, ask your health care provider.  Avoid caffeine.  Avoid alcohol.  Rest as needed. Contact a health care provider if:  You have new symptoms.  You cough up pus.  Your cough does not get better after 2-3 weeks, or your cough gets worse.  You cannot control your cough with suppressant  medicines and you are losing sleep.  You develop pain that is getting worse or pain that is not controlled with pain medicines.  You have a fever.  You have unexplained weight loss.  You have night sweats. Get help right away if:  You cough up blood.  You have difficulty breathing.  Your heartbeat is very fast. This information is not intended to replace advice given to you by your health care provider. Make sure you discuss any questions you have with your health care provider. Document Released: 08/26/2010 Document Revised: 08/05/2015 Document Reviewed: 05/06/2014 Elsevier Interactive Patient Education  2019 Elsevier Inc.  

## 2018-05-13 ENCOUNTER — Telehealth: Payer: Self-pay

## 2018-05-13 NOTE — Telephone Encounter (Signed)
-----   Message from Virginia Crews, MD sent at 05/13/2018  8:47 AM EST ----- Normal Chest XRay. No pneumonia.

## 2018-05-13 NOTE — Telephone Encounter (Signed)
Patient was advised.  

## 2018-05-19 NOTE — Progress Notes (Signed)
Patient's Name: [name]  MRN: [id number]  Referring Provider: Virginia Crews, MD  DOB: [birth year]  PCP: Virginia Crews, MD  DOS: 05/20/2018  Note by: Gaspar Cola, MD  Service setting: Ambulatory outpatient  Specialty: Interventional Pain Management  Location: [facility] (AMB) Pain Management Facility    Patient type: Established   HPI  Reason for Visit: [name] is a 58 y.o. year old, female patient, who comes today with a chief complaint of Hip Pain (bilateral) and Back Pain (low) Last Appointment: Her last appointment at our practice was on 11/21/2017. I last saw her on Visit date not found.  Pain Assessment: Today, [name] describes the severity of the Chronic pain as a 4 /10. She indicates the location/referral of the pain to be Back Lower/hips, groin. Onset was: More than a month ago. The quality of pain is described as Sharp, Constant, Sore. Temporal description, or timing of pain is: Constant. Possible modifying factors: heat. [name]  height is 5' 4"  (1.626 m) and weight is 155 lb (70.3 kg). Her oral temperature is 97.9 F (36.6 C). Her blood pressure is 124/90 and her pulse is 71. Her respiration is 18 and oxygen saturation is 100%.   The patient returns to the clinics today for evaluation.  The last time we prescribed any pain medication to her was on 08/21/2017 and it was filled on 11/08/2017.  She has not taken any oxycodone since she ran out 12/08/17.  Today the patient returns indicating that she is only taking 1 mg of alprazolam at bedtime.  I confronted her with the PMP report where it says that she is getting 1 tablets/month as she indicated that her psychiatrist, Dr. Lavon Paganini, has okay for her to take that 1 tablet at bedtime and that she is in need to come off of it.  This comes as a consequence of me having informed the patient that according to CDC guidelines, she should not be taking combinations of opioids along with  benzodiazepines, unless she has been fully evaluated by a psychiatrist that feels that it is medically necessary for her to continue on the benzodiazepines.  She was also told that even with the approval of the psychiatrist, we would not ever prescribe any benzodiazepines and she would need to get those from the psychiatrist.  Today she returns after having been off of her opioids since around 12/09/2017.  Because the patient has been off of them for such a long time, it is likely that she no longer has tolerance to those opioids and therefore I will not be going back to her prior dose of oxycodone 10 mg every 6 hours.  I have informed her that I will only go to 5 mg every 8 hours to be used only PRN.  She indicated to me that I am because she is scared of the opioids and she rather not be taking any.  However, to confirm her story I went ahead and called Dr. Lavon Paganini, who happens to be a family medicine physician, not a psychiatrist like the patient indicated.  Furthermore, when I asked her about the patient's benzodiazepine use, she indicated that she had referred the patient to a psychiatrist, but the patient has not gone to the appointment yet.  Furthermore, she indicated that she continues to request refills of #90 pills, monthly, and therefore this is not consistent with the patient's history that she is only taking 1 mg at bedtime.  However,  since the patient indicates and swears at that what she is taking, we also do not want the patient to be hoarding medications and therefore I have agreed with Dr. Lavon Paganini, not to prescribe more than #30 tab/month.  We also agreed that she will not be writing for any medication containing any opioid analgesics.  The patient was sent today to update some of her x-rays and lab work.  I will see him back in about 2 weeks, at which time I will talk to her further about this issue of the benzodiazepines.  I am not very happy that she attempted to deceive  me with the information that she provided me.  Clearly if I cannot trust her, I can also not prescribe any controlled substances for her.  This will need to be discussed later on.  Controlled Substance Pharmacotherapy Assessment REMS (Risk Evaluation and Mitigation Strategy)  Analgesic: Hydrocodone-Chlorphen Er Susp (prescribed by Virginia Crews, Md, Mph on 05/10/2018).  Alprazolam 1 mg 3 times daily. MME/day: 20.91 mg/day.  Hart Rochester, RN  05/20/2018 12:14 PM  Sign when Signing Visit Safety precautions to be maintained throughout the outpatient stay will include: orient to surroundings, keep bed in low position, maintain call bell within reach at all times, provide assistance with transfer out of bed and ambulation.    Pharmacokinetics: Liberation and absorption (onset of action): WNL Distribution (time to peak effect): WNL Metabolism and excretion (duration of action): WNL         Pharmacodynamics: Desired effects: Analgesia: [name] reports >50% benefit. Functional ability: Patient reports that medication allows her to accomplish basic ADLs Clinically meaningful improvement in function (CMIF): Sustained CMIF goals met Perceived effectiveness: Described as relatively effective, allowing for increase in activities of daily living (ADL) Undesirable effects: Side-effects or Adverse reactions: None reported Monitoring: [place] PMP: Online review of the past 52-monthperiod conducted. Compliant with practice rules and regulations Last UDS on record: Summary  Date Value Ref Range Status  05/21/2017 FINAL  Final    Comment:    ==================================================================== TOXASSURE SELECT 13 (MW) ==================================================================== Test                             Result       Flag       Units Drug Present and Declared for Prescription Verification   Alprazolam                     376          EXPECTED   ng/mg creat    Alpha-hydroxyalprazolam        371          EXPECTED   ng/mg creat    Source of alprazolam is a scheduled prescription medication.    Alpha-hydroxyalprazolam is an expected metabolite of alprazolam.   Oxycodone                      795          EXPECTED   ng/mg creat   Noroxycodone                   1900         EXPECTED   ng/mg creat    Sources of oxycodone include scheduled prescription medications.    Noroxycodone is an expected metabolite of oxycodone. ==================================================================== Test  Result    Flag   Units      Ref Range   Creatinine              21               mg/dL      >=20 ==================================================================== Declared Medications:  The flagging and interpretation on this report are based on the  following declared medications.  Unexpected results may arise from  inaccuracies in the declared medications.  **Note: The testing scope of this panel includes these medications:  Alprazolam  Oxycodone  **Note: The testing scope of this panel does not include following  reported medications:  Albuterol  Bupropion (Wellbutrin)  Docusate  Escitalopram (Lexapro)  Metoprolol (Toprol)  Montelukast (Singulair)  Valacyclovir (Valtrex) ==================================================================== For clinical consultation, please call [phone]. ====================================================================    UDS interpretation: Compliant          Medication Assessment Form: Reviewed. Patient indicates being compliant with therapy Treatment compliance: Compliant Risk Assessment Profile: Aberrant behavior: See initial evaluations. None observed or detected today Comorbid factors increasing risk of overdose: See initial evaluation. No additional risks detected today Opioid risk tool (ORT):  Opioid Risk  05/20/2018  Alcohol 0  Illegal Drugs 0  Rx Drugs 0  Alcohol 0   Illegal Drugs 0  Rx Drugs -  Age between 16-45 years  0  History of Preadolescent Sexual Abuse 0  Psychological Disease 2  ADD -  OCD -  Bipolar -  Depression 1  Opioid Risk Tool Scoring 3  Opioid Risk Interpretation Low Risk    ORT Scoring interpretation table:  Score <3 = Low Risk for SUD  Score between 4-7 = Moderate Risk for SUD  Score >8 = High Risk for Opioid Abuse   Risk of substance use disorder (SUD): Low  Risk Mitigation Strategies:  Patient Counseling: Covered Patient-Prescriber Agreement (PPA): Present and active  Notification to other healthcare providers: Done  Pharmacologic Plan: No change in therapy, at this time.             ROS  Constitutional: Denies any fever or chills Gastrointestinal: No reported hemesis, hematochezia, vomiting, or acute GI distress Musculoskeletal: Denies any acute onset joint swelling, redness, loss of ROM, or weakness Neurological: No reported episodes of acute onset apraxia, aphasia, dysarthria, agnosia, amnesia, paralysis, loss of coordination, or loss of consciousness  Medication Review  ALPRAZolam, AeroChamber MV, DULoxetine, Docusate Sodium, Fluticasone-Salmeterol, albuterol, buPROPion, busPIRone, dexlansoprazole, metoprolol succinate, montelukast, ondansetron, oxyCODONE, and valACYclovir  History Review  Allergy: [name] is allergic to amoxicillin; chlorhexidine gluconate; clindamycin; codeine; erythromycin; penicillin g; sulfa antibiotics; levofloxacin; shellfish allergy; decadron [dexamethasone]; mangifera indica; papaya derivatives; betadine [povidone iodine]; chlorhexidine; clarithromycin; clindamycin/lincomycin; other; povidone-iodine; and prednisone. Drug: [name]  reports no history of drug use. Alcohol:  reports no history of alcohol use. Tobacco:  reports that she has never smoked. She has never used smokeless tobacco. Social: [name]  reports that she has never smoked. She has never used smokeless tobacco.  She reports that she does not drink alcohol or use drugs. 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), Anemia, Anxiety, Asthma, Cephalalgia (08/18/2014), Cervical disc disease (08/18/2014), Chronic headaches, DDD (degenerative disc disease), Depression, Dissociative disorder, Dizziness (04/22/2013), Duodenal ulcer with hemorrhage and perforation ([place]) (04/27/2003), GERD (gastroesophageal reflux disease), H/O arthrodesis (08/18/2014), Headache(784.0), History of blood transfusion, History of cervical spinal surgery (01/04/2015), History of kidney stones, Hypertension, Inverted T wave, Narrowing  of intervertebral disc space (08/18/2014), Orthostatic hypotension (04/22/2013), Pain, Pneumonia, PONV (postoperative nausea and vomiting), Postop Hyponatremia (05/14/2012), Postoperative anemia due to acute blood loss (05/14/2012), PTSD (post-traumatic stress disorder), Right foot drop, and Right hip arthralgia (08/18/2014). Surgical: [name]  has a past surgical history that includes RIGHT HIP ARTHROSCOPY FOR LABRAL TEAR (ABOUT 2010); Anterior fusion cervical spine (MAY 2012); Nasal septum surgery (MARCH 2013); Back surgery (2009); Tonsillectomy; Abdominal hysterectomy; DIAGNOSTIC LAPAROSCOPIES - MULTIPLE FOR ENDOMETRIOSIS; Cholecystectomy; Hip arthroscopy (09/20/2011); Shoulder arthroscopy (05-06-12); Carpal tunnel release (05-06-12); Total hip arthroplasty (Right, 05/13/2012); Anterior cervical decomp/discectomy fusion (N/A, 10/30/2012); Posterior cervical fusion/foraminotomy (N/A, 04/16/2013); Breast biopsy (Right, 2008); Breast excisional biopsy (Left, 1998); Breast reduction surgery (Bilateral, 06/2016); Colonoscopy with propofol (N/A, 01/26/2017); Esophagogastroduodenoscopy (egd) with propofol (N/A, 01/26/2017); Esophageal dilation (N/A, 01/26/2017); polypectomy (N/A, 01/26/2017); PH impedance study (N/A, 05/30/2017); and Esophageal manometry (N/A,  05/30/2017). Family: family history includes Aneurysm in her maternal grandmother and mother; Anxiety disorder in her grandchild; Bipolar disorder in her grandchild and sister; Breast cancer (age of onset: 37) in her paternal grandmother; Depression in her grandchild. Problem List: 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  Lab Review  Kidney Function Lab Results  Component Value Date   BUN 8 12/25/2017   CREATININE 0.79 12/25/2017   BCR 10 12/25/2017   GFRAA 95 12/25/2017   GFRNONAA 83 12/25/2017  Liver Function Lab Results  Component Value Date   AST 13 12/25/2017   ALT 9 12/25/2017   ALBUMIN 4.3 12/25/2017  Note: Above Lab results reviewed.  Imaging Review  DG Chest 2 View CLINICAL DATA:  Productive cough with SOB and left sided chest pain x1 week. Patient states that she recently aspirated some food but that this happens to her often and has pneumonia from it a few times. Hx of asthma.  EXAM: CHEST - 2 VIEW  COMPARISON:  03/06/2015  FINDINGS: Lungs are  clear.  Heart size and mediastinal contours are within normal limits.  No effusion.  Cervical at thoracolumbar spinal fixation hardware partially visualized.  IMPRESSION: No acute cardiopulmonary disease.  Electronically Signed   By: Lucrezia Europe M.D.   On: 05/11/2018 14:09 Note: Reviewed        Physical Exam  General appearance: Well nourished, well developed, and well hydrated. In no apparent acute distress Mental status: Alert, oriented x 3 (person, place, & time)       Respiratory: No evidence of acute respiratory distress Eyes: PERLA Vitals: BP 124/90   Pulse 71   Temp 97.9 F (36.6 C) (Oral)   Resp 18   Ht 5' 4"  (1.626 m)   Wt 155 lb (70.3 kg)   SpO2 100%   BMI 26.61 kg/m  BMI: Estimated body mass index is 26.61 kg/m as calculated from  the following:   Height as of this encounter: 5' 4"  (1.626 m).   Weight as of this encounter: 155 lb (70.3 kg). Ideal: Ideal body weight: 54.7 kg (120 lb 9.5 oz) Adjusted ideal body weight: 60.9 kg (134 lb 5.7 oz)  Assessment   Status Diagnosis  Persistent Persistent Persistent 1. Chronic hip pain (Primary Source of Pain) (Bilateral) (L>R)   2. Groin pain, chronic, left   3. Chronic low back pain [facility] source of pain) (Bilateral) (L>R)   4. Other intervertebral disc degeneration, lumbar region   5. Chronic shoulder pain (Secondary source of pain) (Bilateral) (L>R)   6. Long term prescription benzodiazepine use   7. Chronic pain syndrome   8. Disorder of skeletal system   9. Pharmacologic therapy   10. Problems influencing health status      Updated Problems: Problem  Groin Pain, Chronic, Left  Disorder of Skeletal System  Problems Influencing Health Status  Pharmacologic Therapy  Opiate Use  Other Intervertebral Disc Degeneration, Lumbar Region  Acute Postoperative Pain (Resolved)    Plan of Care  Pharmacotherapy (Medications Ordered): Meds ordered this encounter  Medications  . oxyCODONE (OXY  IR/ROXICODONE) 5 MG immediate release tablet    Sig: Take 1 tablet (5 mg total) by mouth every 8 (eight) hours as needed for up to 7 days for severe pain. Must last 7 days.    Dispense:  21 tablet    Refill:  0  . oxyCODONE (OXY IR/ROXICODONE) 5 MG immediate release tablet    Sig: Take 1 tablet (5 mg total) by mouth every 8 (eight) hours as needed for up to 7 days for severe pain.    Dispense:  21 tablet    Refill:  0   Administered today: Sindy Messing. [name] had no medications administered during this visit.  Orders:  Orders Placed This Encounter  Procedures  . CT LUMBAR SPINE WO CONTRAST    In addition to any acute findings, please report on degenerative changes related to: (Please specify level(s)) (1) ROM & instability (>57m displacement) (2) Facet joint (Zygoapophyseal Joint) (3) DDD and/or IVDD (4) Pars defects (5) Previous surgical changes (Include description of hardware and hardware status, if present) (6) Presence and degree of spondylolisthesis, spondylosis, and/or spondyloarthropathies)  (7) Old Fractures (8) Demineralization (9) Additional bone pathology (10) Stenosis (Central, Lateral Recess, Foraminal) (11) If at all possible, please provide AP diameter (mm) of foraminal and/or central canal.    Standing Status:   Future    Standing Expiration Date:   08/20/2018    Order Specific Question:   Is patient pregnant?    Answer:   No    Order Specific Question:   Preferred imaging location?    Answer:   [facility]    Order Specific Question:   Call Results- Best Contact Number?    Answer:   [phone]([facility]    Order Specific Question:   Radiology Contrast Protocol - do NOT remove file path    Answer:   \\[email or web address].[email or web address]    Order Specific Question:   ** REASON FOR EXAM (FREE TEXT)    Answer:   Left L1/L2 radiculitis  . DG Lumbar Spine Complete W/Bend    In addition to any acute findings, please report on degenerative  changes related to: (Please specify level(s)) (1) ROM & instability (>425mdisplacement) (2) Facet joint (Zygoapophyseal Joint) (3) DDD and/or IVDD (4) Pars defects (5) Previous surgical changes (Include description of hardware and hardware status, if present) (  6) Presence and degree of spondylolisthesis, spondylosis, and/or spondyloarthropathies)  (7) Old Fractures (8) Demineralization (9) Additional bone pathology (10) Stenosis (Central, Lateral Recess, Foraminal) (11) If at all possible, please provide AP diameter (mm) of foraminal and/or central canal.    Standing Status:   Future    Number of Occurrences:   1    Standing Expiration Date:   08/20/2018    Order Specific Question:   Reason for Exam (SYMPTOM  OR DIAGNOSIS REQUIRED)    Answer:   Low back pain    Order Specific Question:   Is patient pregnant?    Answer:   No    Order Specific Question:   Preferred imaging location?    Answer:   [facility]    Order Specific Question:   Call Results- Best Contact Number?    Answer:   [phone] ([facility])    Order Specific Question:   Radiology Contrast Protocol - do NOT remove file path    Answer:   \\[email or web address].pdf  . ToxASSURE Select 13 (MW), Urine    Volume: 30 ml(s). Minimum 3 ml of urine is needed. Document temperature of fresh sample. Indications: Long term (current) use of opiate analgesic (Z79.891)  . Magnesium    Indication: Pharmacologic therapy (F53.794)    Order Specific Question:   CC Results    Answer:   PCP-NURSE [[id number]]  . Vitamin B12    Indication: Pharmacologic therapy (J09.295).    Order Specific Question:   CC Results    Answer:   PCP-NURSE [[id number]]  . Sedimentation rate    Indication: Disorder of skeletal system (M89.9)    Order Specific Question:   CC Results    Answer:   PCP-NURSE [[id number]]  . 25-Hydroxyvitamin D Lcms D2+D3    Indication: Disorder of skeletal system (M89.9).    Order Specific  Question:   CC Results    Answer:   PCP-NURSE [[id number]]  . C-reactive protein    Indication: Problems influencing health status (Z78.9)    Order Specific Question:   CC Results    Answer:   PCP-NURSE [[id number]]   Follow-up plan:   Return in about 2 weeks (around 06/03/2018) for F/U eval (after test completion), Med-Mgmt, w/ Dr. Dossie Arbour.    Interventional options: Considering:   Palliativebilateral suprascapular nerve block (without steroids) #3  Possible right-sided suprascapular nerve RFA #1   Palliative PRN treatment(s):   Palliativebilateral suprascapular nerve block Palliative left-sided suprascapular nerve RFA #2(last done on 01/04/2017)   Note by: Gaspar Cola, MD Date: 05/20/2018; Time: 2:50 PM

## 2018-05-20 ENCOUNTER — Ambulatory Visit (HOSPITAL_BASED_OUTPATIENT_CLINIC_OR_DEPARTMENT_OTHER): Payer: Medicare Other | Admitting: Pain Medicine

## 2018-05-20 ENCOUNTER — Ambulatory Visit
Admission: RE | Admit: 2018-05-20 | Discharge: 2018-05-20 | Disposition: A | Discharge status: Home / Self Care | Payer: Medicare Other | Source: Ambulatory Visit | Attending: Pain Medicine | Admitting: Pain Medicine

## 2018-05-20 ENCOUNTER — Other Ambulatory Visit: Payer: Self-pay

## 2018-05-20 ENCOUNTER — Telehealth: Payer: Self-pay | Admitting: Family Medicine

## 2018-05-20 ENCOUNTER — Encounter: Payer: Self-pay | Admitting: Pain Medicine

## 2018-05-20 VITALS — BP 124/90 | HR 71 | Temp 97.9°F | Resp 18 | Ht 64.0 in | Wt 155.0 lb

## 2018-05-20 DIAGNOSIS — M545 Low back pain: Secondary | ICD-10-CM | POA: Insufficient documentation

## 2018-05-20 DIAGNOSIS — Z789 Other specified health status: Secondary | ICD-10-CM

## 2018-05-20 DIAGNOSIS — Z79899 Other long term (current) drug therapy: Secondary | ICD-10-CM

## 2018-05-20 DIAGNOSIS — R1032 Left lower quadrant pain: Secondary | ICD-10-CM

## 2018-05-20 DIAGNOSIS — G8929 Other chronic pain: Secondary | ICD-10-CM

## 2018-05-20 DIAGNOSIS — M5136 Other intervertebral disc degeneration, lumbar region: Secondary | ICD-10-CM | POA: Insufficient documentation

## 2018-05-20 DIAGNOSIS — M25512 Pain in left shoulder: Secondary | ICD-10-CM

## 2018-05-20 DIAGNOSIS — M25511 Pain in right shoulder: Secondary | ICD-10-CM

## 2018-05-20 DIAGNOSIS — M899 Disorder of bone, unspecified: Secondary | ICD-10-CM

## 2018-05-20 DIAGNOSIS — M25559 Pain in unspecified hip: Secondary | ICD-10-CM

## 2018-05-20 DIAGNOSIS — Z981 Arthrodesis status: Secondary | ICD-10-CM | POA: Diagnosis not present

## 2018-05-20 DIAGNOSIS — G894 Chronic pain syndrome: Secondary | ICD-10-CM | POA: Diagnosis not present

## 2018-05-20 DIAGNOSIS — M51369 Other intervertebral disc degeneration, lumbar region without mention of lumbar back pain or lower extremity pain: Secondary | ICD-10-CM | POA: Insufficient documentation

## 2018-05-20 DIAGNOSIS — M549 Dorsalgia, unspecified: Secondary | ICD-10-CM | POA: Diagnosis not present

## 2018-05-20 MED ORDER — OXYCODONE HCL 5 MG PO TABS: 5.0000 mg | ORAL_TABLET | Freq: Three times a day (TID) | ORAL | 0 refills | Status: DC | PRN

## 2018-05-20 NOTE — Patient Instructions (Signed)
____________________________________________________________________________________________  Medication Rules  Purpose: To inform patients, and their family members, of our rules and regulations.  Applies to: All patients receiving prescriptions (written or electronic).  Pharmacy of record: Pharmacy where electronic prescriptions will be sent. If written prescriptions are taken to a different pharmacy, please inform the nursing staff. The pharmacy listed in the electronic medical record should be the one where you would like electronic prescriptions to be sent.  Electronic prescriptions: In compliance with the [place] Strengthen Opioid Misuse Prevention (STOP) Act of 2017 (Session Law 2017-74/H243), effective March 13, 2018, all controlled substances must be electronically prescribed. Calling prescriptions to the pharmacy will cease to exist.  Prescription refills: Only during scheduled appointments. Applies to all prescriptions.  NOTE: The following applies primarily to controlled substances (Opioid* Pain Medications).   Patient's responsibilities: 1. Pain Pills: Bring all pain pills to every appointment (except for procedure appointments). 2. Pill Bottles: Bring pills in original pharmacy bottle. Always bring the newest bottle. Bring bottle, even if empty. 3. Medication refills: You are responsible for knowing and keeping track of what medications you take and those you need refilled. The day before your appointment: write a list of all prescriptions that need to be refilled. The day of the appointment: give the list to the admitting nurse. Prescriptions will be written only during appointments. No prescriptions will be written on procedure days. If you forget a medication: it will not be "Called in", "Faxed", or "electronically sent". You will need to get another appointment to get these prescribed. No early refills. Do not call asking to have your prescription filled  early. 4. Prescription Accuracy: You are responsible for carefully inspecting your prescriptions before leaving our office. Have the discharge nurse carefully go over each prescription with you, before taking them home. Make sure that your name is accurately spelled, that your address is correct. Check the name and dose of your medication to make sure it is accurate. Check the number of pills, and the written instructions to make sure they are clear and accurate. Make sure that you are given enough medication to last until your next medication refill appointment. 5. Taking Medication: Take medication as prescribed. When it comes to controlled substances, taking less pills or less frequently than prescribed is permitted and encouraged. Never take more pills than instructed. Never take medication more frequently than prescribed.  6. Inform other Doctors: Always inform, all of your healthcare providers, of all the medications you take. 7. Pain Medication from other Providers: You are not allowed to accept any additional pain medication from any other Doctor or Healthcare provider. There are two exceptions to this rule. (see below) In the event that you require additional pain medication, you are responsible for notifying us, as stated below. 8. Medication Agreement: You are responsible for carefully reading and following our Medication Agreement. This must be signed before receiving any prescriptions from our practice. Safely store a copy of your signed Agreement. Violations to the Agreement will result in no further prescriptions. (Additional copies of our Medication Agreement are available upon request.) 9. Laws, Rules, & Regulations: All patients are expected to follow all Federal and State Laws, Statutes, Rules, & Regulations. Ignorance of the Laws does not constitute a valid excuse. The use of any illegal substances is prohibited. 10. Adopted CDC guidelines & recommendations: Target dosing levels will be  at or below 60 MME/day. Use of benzodiazepines** is not recommended.  Exceptions: There are only two exceptions to the rule of not   receiving pain medications from other Healthcare Providers. 1. Exception #1 (Emergencies): In the event of an emergency (i.e.: accident requiring emergency care), you are allowed to receive additional pain medication. However, you are responsible for: As soon as you are able, call our office [phone], at any time of the day or night, and leave a message stating your name, the date and nature of the emergency, and the name and dose of the medication prescribed. In the event that your call is answered by a member of our staff, make sure to document and save the date, time, and the name of the person that took your information.  2. Exception #2 (Planned Surgery): In the event that you are scheduled by another doctor or dentist to have any type of surgery or procedure, you are allowed (for a period no longer than 30 days), to receive additional pain medication, for the acute post-op pain. However, in this case, you are responsible for picking up a copy of our "Post-op Pain Management for Surgeons" handout, and giving it to your surgeon or dentist. This document is available at our office, and does not require an appointment to obtain it. Simply go to our office during business hours (Monday-Thursday from 8:00 AM to 4:00 PM) (Friday 8:00 AM to 12:00 Noon) or if you have a scheduled appointment with us, prior to your surgery, and ask for it by name. In addition, you will need to provide us with your name, name of your surgeon, type of surgery, and date of procedure or surgery.  *Opioid medications include: morphine, codeine, oxycodone, oxymorphone, hydrocodone, hydromorphone, meperidine, tramadol, tapentadol, buprenorphine, fentanyl, methadone. **Benzodiazepine medications include: diazepam (Valium), alprazolam (Xanax), clonazepam (Klonopine), lorazepam (Ativan), clorazepate  (Tranxene), chlordiazepoxide (Librium), estazolam (Prosom), oxazepam (Serax), temazepam (Restoril), triazolam (Halcion) (Last updated: 05/10/2017) ____________________________________________________________________________________________   ____________________________________________________________________________________________  Medication Recommendations and Reminders  Applies to: All patients receiving prescriptions (written and/or electronic).  Medication Rules & Regulations: These rules and regulations exist for your safety and that of others. They are not flexible and neither are we. Dismissing or ignoring them will be considered "non-compliance" with medication therapy, resulting in complete and irreversible termination of such therapy. (See document titled "Medication Rules" for more details.) In all conscience, because of safety reasons, we cannot continue providing a therapy where the patient does not follow instructions.  Pharmacy of record:   Definition: This is the pharmacy where your electronic prescriptions will be sent.   We do not endorse any particular pharmacy.  You are not restricted in your choice of pharmacy.  The pharmacy listed in the electronic medical record should be the one where you want electronic prescriptions to be sent.  If you choose to change pharmacy, simply notify our nursing staff of your choice of new pharmacy.  Recommendations:  Keep all of your pain medications in a safe place, under lock and key, even if you live alone.   After you fill your prescription, take 1 week's worth of pills and put them away in a safe place. You should keep a separate, properly labeled bottle for this purpose. The remainder should be kept in the original bottle. Use this as your primary supply, until it runs out. Once it's gone, then you know that you have 1 week's worth of medicine, and it is time to come in for a prescription refill. If you do this correctly, it  is unlikely that you will ever run out of medicine.  To make sure that the above recommendation works,   it is very important that you make sure your medication refill appointments are scheduled at least 1 week before you run out of medicine. To do this in an effective manner, make sure that you do not leave the office without scheduling your next medication management appointment. Always ask the nursing staff to show you in your prescription , when your medication will be running out. Then arrange for the receptionist to get you a return appointment, at least 7 days before you run out of medicine. Do not wait until you have 1 or 2 pills left, to come in. This is very poor planning and does not take into consideration that we may need to cancel appointments due to bad weather, sickness, or emergencies affecting our staff.  "Partial Fill": If for any reason your pharmacy does not have enough pills/tablets to completely fill or refill your prescription, do not allow for a "partial fill". You will need a separate prescription to fill the remaining amount, which we will not provide. If the reason for the partial fill is your insurance, you will need to talk to the pharmacist about payment alternatives for the remaining tablets, but again, do not accept a partial fill.  Prescription refills and/or changes in medication(s):   Prescription refills, and/or changes in dose or medication, will be conducted only during scheduled medication management appointments. (Applies to both, written and electronic prescriptions.)  No refills on procedure days. No medication will be changed or started on procedure days. No changes, adjustments, and/or refills will be conducted on a procedure day. Doing so will interfere with the diagnostic portion of the procedure.  No phone refills. No medications will be "called into the pharmacy".  No Fax refills.  No weekend refills.  No Holliday refills.  No after hours  refills.  Remember:  Business hours are:  Monday to Thursday 8:00 AM to 4:00 PM Provider's Schedule: Crystal King, NP - Appointments are:  Medication management: Monday to Thursday 8:00 AM to 4:00 PM Rosabelle Jupin, MD - Appointments are:  Medication management: Monday and Wednesday 8:00 AM to 4:00 PM Procedure day: Tuesday and Thursday 7:30 AM to 4:00 PM Bilal Lateef, MD - Appointments are:  Medication management: Tuesday and Thursday 8:00 AM to 4:00 PM Procedure day: Monday and Wednesday 7:30 AM to 4:00 PM (Last update: 05/10/2017) ____________________________________________________________________________________________   ____________________________________________________________________________________________  CANNABIDIOL (AKA: CBD Oil or Pills)  Applies to: All patients receiving prescriptions of controlled substances (written and/or electronic).  General Information: Cannabidiol (CBD) was discovered in 1940. It is one of some 113 identified cannabinoids in cannabis (Marijuana) plants, accounting for up to 40% of the plant's extract. As of 2018, preliminary clinical research on cannabidiol included studies of anxiety, cognition, movement disorders, and pain.  Cannabidiol is consummed in multiple ways, including inhalation of cannabis smoke or vapor, as an aerosol spray into the cheek, and by mouth. It may be supplied as CBD oil containing CBD as the active ingredient (no added tetrahydrocannabinol (THC) or terpenes), a full-plant CBD-dominant hemp extract oil, capsules, dried cannabis, or as a liquid solution. CBD is thought not have the same psychoactivity as THC, and may affect the actions of THC. Studies suggest that CBD may interact with different biological targets, including cannabinoid receptors and other neurotransmitter receptors. As of 2018 the mechanism of action for its biological effects has not been determined.  In the [place], cannabidiol has a limited  approval by the Food and Drug Administration (FDA) for treatment of only two types   of epilepsy disorders. The side effects of long-term use of the drug include somnolence, decreased appetite, diarrhea, fatigue, malaise, weakness, sleeping problems, and others.  CBD remains a Schedule I drug prohibited for any use.  Legality: Some manufacturers ship CBD products nationally, an illegal action which the FDA has not enforced in 2018, with CBD remaining the subject of an FDA investigational new drug evaluation, and is not considered legal as a dietary supplement or food ingredient as of December 2018. Federal illegality has made it difficult historically to conduct research on CBD. CBD is openly sold in head shops and health food stores in some states where such sales have not been explicitly legalized.  Warning: Because it is not FDA approved for general use or treatment of pain, it is not required to undergo the same manufacturing controls as prescription drugs.  This means that the available cannabidiol (CBD) may be contaminated with THC.  If this is the case, it will trigger a positive urine drug screen (UDS) test for cannabinoids (Marijuana).  Because a positive UDS for illicit substances is a violation of our medication agreement, your opioid analgesics (pain medicine) may be permanently discontinued. (Last update: 05/31/2017) ____________________________________________________________________________________________    

## 2018-05-20 NOTE — Telephone Encounter (Signed)
[facility] Rushie Chestnut NP) as they seem to be prescribing Xanax to the patient now.  No answer. Left VM with information of last Xanax refill.  We will no longer be filling any prescriptions for controlled substances.  Please make patient aware that any benzos will need to be filled by Psychiatry (who will be made aware of Dr Patricia Nettle plan) and any narcotics by Dr Consuela Mimes.

## 2018-05-20 NOTE — Telephone Encounter (Signed)
Spoke with Caryl Pina at [facility] to cancel remaining refills on 90 tablet RX.  Per Caryl Pina patient also had a RF sent in from Dr. Dorann Ou on 05/13/2018 for 30 tablets.  The RX has not been filled yet, but she is receiving benzos RX from two different providers. Dr. Dorann Ou contact number is [phone].

## 2018-05-20 NOTE — Progress Notes (Signed)
Safety precautions to be maintained throughout the outpatient stay will include: orient to surroundings, keep bed in low position, maintain call bell within reach at all times, provide assistance with transfer out of bed and ambulation.  

## 2018-05-20 NOTE — Telephone Encounter (Signed)
Received call from Dr Consuela Mimes today about patient.  She sees him for pain management.  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  She presents to him today stating that I am her psychiatrist and I am ok with her taking benzos and opioids concomitantly.  She also insisted to him that she was only taking Xanax 1 mg nightly.  Dr. Dossie Arbour calls to confirm the validity of this statement.  I expressed to him that I have discussed my concerns with chronic opioid and benzo therapy with the patient multiple times.  I have continued her Xanax well I referred her to psychiatry, but I have stressed with her the need to see psychiatry.  It seems she has not followed up with psychiatry despite these referrals.  She has been getting 90 tablets of Xanax per month.  We agreed that no narcotics will be prescribed from any clinic outside of his, including cough syrups.  Since she claims she is only taking 1 Xanax per day, she will only be getting 30 pills/month from now on.  The 90 pills that she filled last month should last her until May at least.  Her refills that were placed on that prescription will be canceled per pharmacy.  Please call the patient and let her know that Dr. Dossie Arbour and I have spoken and that she will only be getting 30 Xanax per month from now on.  She also does need to follow-up with psychiatry as I will not continue to prescribe this chronically and will initiate a taper if she does not follow-up with psychiatry.  Please also call the pharmacy and cancel the 2 refills on her Xanax prescription

## 2018-05-21 NOTE — Telephone Encounter (Signed)
Patient advised. She states she discussed with Dr. Brita Romp at her last OV that she was attempting to wean off the Alprazolam with Dr. Dorann Ou help. She states she is currently take 1 tablet per day and of the 90 tablets that was prescribed she still has  2 months supply at home. She states that is why she did not pick up the RX that Dr. Dorann Ou wrote from the pharmacy.

## 2018-05-24 ENCOUNTER — Ambulatory Visit
Admission: RE | Admit: 2018-05-24 | Discharge: 2018-05-24 | Disposition: A | Discharge status: Home / Self Care | Payer: Medicare Other | Source: Ambulatory Visit | Attending: Pain Medicine | Admitting: Pain Medicine

## 2018-05-24 ENCOUNTER — Other Ambulatory Visit: Payer: Self-pay

## 2018-05-24 DIAGNOSIS — M899 Disorder of bone, unspecified: Secondary | ICD-10-CM | POA: Diagnosis not present

## 2018-05-24 DIAGNOSIS — G8929 Other chronic pain: Secondary | ICD-10-CM | POA: Diagnosis not present

## 2018-05-24 DIAGNOSIS — M5136 Other intervertebral disc degeneration, lumbar region: Secondary | ICD-10-CM | POA: Diagnosis not present

## 2018-05-24 DIAGNOSIS — M545 Low back pain: Secondary | ICD-10-CM | POA: Diagnosis not present

## 2018-05-24 DIAGNOSIS — M5127 Other intervertebral disc displacement, lumbosacral region: Secondary | ICD-10-CM | POA: Diagnosis not present

## 2018-05-24 DIAGNOSIS — Z79899 Other long term (current) drug therapy: Secondary | ICD-10-CM | POA: Diagnosis not present

## 2018-05-24 DIAGNOSIS — Z789 Other specified health status: Secondary | ICD-10-CM | POA: Diagnosis not present

## 2018-05-24 LAB — TOXASSURE SELECT 13 (MW), URINE

## 2018-05-26 NOTE — Progress Notes (Signed)
Patient's Name: [name]  MRN: [id number]  Referring Provider: Virginia Crews, Cooley  DOB: [birth year]  PCP: Shelby Crews, Cooley  DOS: 05/29/2018  Note by: [name]  Service setting: Ambulatory outpatient  Specialty: Interventional Pain Management  Location: [facility] (AMB) Pain Management Facility    Patient type: Established   Primary Reason(s) for Visit: Evaluation of chronic illnesses with exacerbation, or progression (Level of risk: moderate) CC: Back Pain (low)  HPI  [name] is a 58 y.o. year old, female patient, who comes today for a follow-up evaluation. She has Chronic neck pain; Anxiety state; Asthma, mild; Blurred vision; Benign paroxysmal positional nystagmus; Congenital renal agenesis and dysgenesis; Clinical depression; Colon, diverticulosis; Fatigue; Cannot sleep; IBS (irritable bowel syndrome); Awareness of heartbeats; RAD (reactive airway disease); Apnea, sleep; Esophagitis, reflux; DOE (dyspnea on exertion); Chronic hip pain (Right); Encounter for therapeutic drug level monitoring; Long term current use of opiate analgesic; Long term prescription opiate use; Opiate use; Chronic pain syndrome; Steroid intolerance; Cervical spondylosis; Cervical facet arthropathy (Bilateral); History of cervical spinal surgery; Chronic shoulder pain (Tertiary Area of Pain)  (Bilateral) (L>R); 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 [name] was last seen on 05/20/2018. Her primarily concern today is the Back Pain (low)  Pain Assessment: Location: Lower Back Radiating: radiates down both legs in the front and side to knees Onset: More than a month ago Duration: Chronic pain Quality: Sharp, Constant, Sore(tearing and electric shock feeling) Severity: 4 /10 (subjective, self-reported pain score)  Note: Reported level is inconsistent with clinical observations. Clinically the patient looks like a 3/10 A 3/10 is viewed as "Moderate" and described as significantly interfering with activities of daily living (ADL). It becomes difficult to feed, bathe, get dressed, get on and off the toilet or to perform personal hygiene functions. Difficult to get in and out of bed or a chair without assistance. Very distracting. With effort, it can be ignored when deeply involved in activities.       When using our objective Pain Scale, levels between 6 and 10/10 are said to belong in an emergency room, as it progressively worsens from a 6/10, described as severely limiting, requiring emergency care not usually available at an outpatient pain management facility. At a 6/10 level, communication becomes difficult and requires great effort. Assistance to reach the emergency department may be required. Facial flushing and profuse sweating along with potentially dangerous increases in heart rate and blood pressure will be evident. Effect on ADL: "it affects all  activities" Timing: Constant Modifying factors: lying still BP: (!) 140/95  HR: 60   [name] with [facility] in [place], [place]  Further details on both, my assessment(s),  as well as the proposed treatment plan, please see below.  Controlled Substance Pharmacotherapy Assessment REMS (Risk Evaluation and Mitigation Strategy)  Analgesic: Oxycodone IR 5 mg 1 tablet p.o. every 8 hours (15 mg/day of oxycodone) MME/day: 22.5 mg/day.  [name]  05/29/2018 10:57 AM  Signed Nursing Pain Medication Assessment:  Safety precautions to be maintained throughout the outpatient stay will include: orient to surroundings, keep bed in low position, maintain call bell within reach at all times, provide assistance with transfer out of bed and ambulation.  Medication Inspection Compliance: Pill count conducted under aseptic conditions, in front of the patient. Neither the pills nor the bottle was removed from the patient's sight at any time. Once count was completed pills were immediately returned to the patient in their original bottle.  Medication: Oxycodone IR Pill/Patch Count: 14 of 21 pills remain Pill/Patch Appearance: Markings consistent with prescribed medication Bottle Appearance: Standard pharmacy container. Clearly labeled. Filled Date: 03 / 16/ 2020 Last Medication intake:  Today   Pharmacokinetics: Liberation and absorption (onset of action): WNL Distribution (time to peak effect): WNL Metabolism and excretion (duration of action): WNL         Pharmacodynamics: Desired effects: Analgesia: [name] reports >50% benefit. Functional ability: Patient reports that medication allows her to accomplish basic ADLs Clinically meaningful improvement in function (CMIF): Sustained CMIF goals met Perceived effectiveness: Described as relatively effective, allowing for increase in activities of daily living (ADL) Undesirable effects: Side-effects or Adverse reactions: None  reported Monitoring: [place] PMP: Online review of the past 16-monthperiod conducted. Compliant with practice rules and regulations Last UDS on record: Summary  Date Value Ref Range Status  05/20/2018 FINAL  Final    Comment:    ==================================================================== TOXASSURE SELECT 13 (MW) ==================================================================== Test                             Result       Flag       Units Drug Present and Declared for Prescription Verification   Alprazolam                     193          EXPECTED   ng/mg creat   Alpha-hydroxyalprazolam        344          EXPECTED   ng/mg creat    Source of alprazolam is a scheduled prescription medication.    Alpha-hydroxyalprazolam is an expected metabolite of alprazolam. Drug Absent but Declared for Prescription Verification   Oxycodone                      Not Detected UNEXPECTED ng/mg creat ==================================================================== Test                      Result    Flag   Units      Ref Range   Creatinine              41               mg/dL      >=20 ==================================================================== Declared Medications:  The flagging and interpretation on this report are based on the  following declared medications.  Unexpected results may arise from  inaccuracies in the declared medications.  **Note: The testing scope of this panel includes these medications:  Alprazolam (Xanax)  Oxycodone  **  Note: The testing scope of this panel does not include following  reported medications:  Albuterol  Bupropion (Wellbutrin)  Buspirone (BuSpar)  Dexlansoprazole (Dexilant)  Docusate  Duloxetine (Cymbalta)  Fluticasone (Advair)  Metoprolol (Toprol)  Montelukast (Singulair)  Ondansetron (Zofran)  Salmeterol (Advair)  Valacyclovir (Valtrex) ==================================================================== For clinical consultation, please  call [phone]. ====================================================================    UDS interpretation: Compliant          Medication Assessment Form: Reviewed. Patient indicates being compliant with therapy Treatment compliance: Compliant Risk Assessment Profile: Aberrant behavior: See initial evaluations. None observed or detected today Comorbid factors increasing risk of overdose: See initial evaluation. No additional risks detected today Opioid risk tool (ORT):  Opioid Risk  05/20/2018  Alcohol 0  Illegal Drugs 0  Rx Drugs 0  Alcohol 0  Illegal Drugs 0  Rx Drugs -  Age between 16-45 years  0  History of Preadolescent Sexual Abuse 0  Psychological Disease 2  ADD -  OCD -  Bipolar -  Depression 1  Opioid Risk Tool Scoring 3  Opioid Risk Interpretation Low Risk    ORT Scoring interpretation table:  Score <3 = Low Risk for SUD  Score between 4-7 = Moderate Risk for SUD  Score >8 = High Risk for Opioid Abuse   Risk of substance use disorder (SUD): Low  Risk Mitigation Strategies:  Patient Counseling: Covered Patient-Prescriber Agreement (PPA): Present and active  Notification to other healthcare providers: Done  Pharmacologic Plan: No change in therapy, at this time.              Laboratory Chemistry  Inflammation Markers (CRP: Acute Phase) (ESR: Chronic Phase) Lab Results  Component Value Date   CRP 5 05/24/2018   ESRSEDRATE 7 05/24/2018                         Rheumatology Markers No results found.  Renal Function Markers Lab Results  Component Value Date   BUN 8 12/25/2017   CREATININE 0.79 12/25/2017   BCR 10 12/25/2017   GFRAA 95 12/25/2017   GFRNONAA 83 12/25/2017                             Hepatic Function Markers Lab Results  Component Value Date   AST 13 12/25/2017   ALT 9 12/25/2017   ALBUMIN 4.3 12/25/2017   ALKPHOS 138 (H) 12/25/2017   LIPASE 23 04/06/2015                        Electrolytes Lab Results  Component Value  Date   NA 140 12/25/2017   K 4.4 12/25/2017   CL 102 12/25/2017   CALCIUM 9.7 12/25/2017   MG 2.1 05/24/2018                        Neuropathy Markers Lab Results  Component Value Date   VITAMINB12 397 05/24/2018   HGBA1C 5.1 01/27/2016   HIV NON-REACTIVE 12/07/2016                        CNS Tests Lab Results  Component Value Date   COLORCSF PINK (A) 03/06/2015   COLORCSF PINK (A) 03/06/2015   APPEARCSF HAZY (A) 03/06/2015   APPEARCSF HAZY (A) 03/06/2015   RBCCOUNTCSF 1,088 (H) 03/06/2015   RBCCOUNTCSF 2,910 (H) 03/06/2015   WBCCSF 5 03/06/2015  WBCCSF 10 03/06/2015   POLYSCSF 21 03/06/2015   POLYSCSF 42 03/06/2015   LYMPHSCSF 61 03/06/2015   LYMPHSCSF 35 03/06/2015   EOSCSF 0 03/06/2015   EOSCSF 0 03/06/2015   PROTEINCSF 35 03/06/2015   GLUCCSF 69 03/06/2015                        Bone Pathology Markers Lab Results  Component Value Date   25OHVITD1 13 (L) 05/24/2018   25OHVITD2 3.7 05/24/2018   25OHVITD3 8.9 05/24/2018                         Coagulation Parameters Lab Results  Component Value Date   INR 1.05 03/06/2015   LABPROT 13.9 03/06/2015   APTT 33 03/06/2015   PLT 426 12/25/2017                        Cardiovascular Markers Lab Results  Component Value Date   TROPONINI <0.03 03/06/2015   HGB 12.1 12/25/2017   HCT 37.1 12/25/2017                         CA Markers No results found.  Endocrine Markers Lab Results  Component Value Date   TSH 1.800 12/25/2017                        Note: Lab results reviewed.  Imaging Review  Cervical Imaging: Cervical MR wo contrast:  Results for orders placed during the hospital encounter of 04/16/13  MR Cervical Spine Wo Contrast   Narrative CLINICAL DATA:  Right-sided weakness and numbness post anterior and posterior cervical fusion.  EXAM: MRI CERVICAL SPINE WITHOUT CONTRAST  TECHNIQUE: Multiplanar, multisequence MR imaging was performed. No intravenous contrast was  administered.  COMPARISON:  Prior CT performed earlier on the same day and.  FINDINGS: The visualized portions of the brain and posterior fossa demonstrate a normal appearance with normal signal intensity. The craniocervical junction is widely patent.  The vertebral bodies are normally aligned with preservation of the normal cervical lordosis. Vertebral body heights are preserved.  Postoperative changes from recent C5 corpectomy with anterior plate screw fixation at C4 through C6. Posterior spinal fusion with bilateral transpedicular screws at C4 and C6 are present as well. The hardware appears well positioned without evidence of complication. Hyperintense T2 signal intensity within the posterior paraspinous soft tissues at the level of C3 through C6 is compatible likely postoperative edema. Small amount of fluid is also seen within the prevertebral soft tissues anterior to the cervical spine, also compatible with postoperative edema/effusion. No loculated fluid collection identified. No epidural hematoma. The cervical spinal cord itself demonstrates a normal appearance with normal signal intensity without evidence of injury, although evaluation at the level of the cervical fusion is somewhat limited due to susceptibility artifact. No significant canal or neural foraminal stenosis identified at the C4 through C6 levels.  At C2-3, there is no significant canal or neural foraminal stenosis.  At C3-4, no significant degenerative disc disease. No canal or neural foraminal stenosis.  At C4-5, there is mild degenerative osteophytosis without significant canal or neural foraminal stenosis. No evidence of postoperative complication.  At C5-6, there is mild diffuse degenerative disc osteophytosis without significant canal or neural foraminal stenosis. No evidence of postoperative complication.  At C6-7, there is mild diffuse degenerative osteophytosis without significant canal or  neural foraminal stenosis. No evidence of postoperative complication.  At C7-T1, minimal degenerative disc osteophyte and bilateral facet arthrosis. No canal or neural foraminal stenosis.  IMPRESSION: 1. Postoperative sequelae of recent cervical spinal fusion without complication. No evidence of epidural hematoma, cord injury, or significant canal or foraminal stenosis identified. 2. Hyperintense T2 signal intensity within the prevertebral soft tissues anterior the cervical spine as well as within the posterior is paraspinous soft tissues at C3 through C6, likely related to recent surgery. No loculated fluid collection identified.  Critical Value/emergent results were called by telephone at the time of interpretation on 04/17/2013 at 1:14 AM to Dr. Melina Schools , who verbally acknowledged these results.   Electronically Signed   By: Jeannine Boga M.D.   On: 04/17/2013 01:16    Cervical MR w/wo contrast:  Results for orders placed during the hospital encounter of 08/21/11  MR Cervical Spine W Wo Contrast   Addendum **ADDENDUM** CREATED: 08/21/2011 15:13:15  The original report was by Dr. Van Clines.  The following addendum is by Dr. Van Clines:  "BUN and creatinine were obtained on site at [place] at [address]  Test results for your reference are:   Creatinine: 0.6   Bun:        7  **END ADDENDUM** SIGNED BY: Lynnell Chad. Janeece Fitting, M.D.      Carron Curie, Cooley 08/21/2011  3:15 PM         Narrative *RADIOLOGY REPORT*  Clinical Data: Neck pain.  Headaches.  Right arm pain.  Prior cervical spine surgery.  MRI CERVICAL SPINE WITHOUT AND WITH CONTRAST  Technique:  Multiplanar and multiecho pulse sequences of the cervical spine, to include the craniocervical junction and cervicothoracic junction, were obtained according to standard protocol without and with intravenous contrast.  Contrast: 39m MULTIHANCE GADOBENATE DIMEGLUMINE 529 MG/ML  IV SOLN  Comparison: Multiple exams, including 08/14/2011 and 04/22/2010  Findings: The craniocervical junction appears unremarkable.  Metal artifact associated with plate screw fixator noted at C6-7.  No significant vertebral subluxation.  Low-level vertebral edema is present in the C5 vertebral body, increased from the prior exam.  No significant abnormal cord signal is identified.  There is 2 mm of degenerative anterior subluxation of C3-4 and C4- 5.  Additional findings at individual levels are as follows:  C2-3:  No impingement.  The right facet joint is probably fused.  C3-4:  Mild right foraminal stenosis secondary to facet and uncinate spurring.  C4-5: Borderline right foraminal stenosis secondary to facet and uncinate spurring. Disc bulge noted.  C5-6:  Mild right foraminal stenosis due to facet and uncinate spurring.  C6-7:  Mild right foraminal stenosis due to uncinate spurring. This level is fused.  C7-T1:  Unremarkable.  T1-2:  Unremarkable.  IMPRESSION:  1.  Mild right-sided foraminal impingement at C2-4, C5-6, and C6-7 primarily due to spurring. 2.  Low-level edema in the C5 vertebral body is probably related to degenerative endplate findings at CS2-8  Original Report Authenticated By: WCarron Curie M.D.   Cervical CT wo contrast:  Results for orders placed during the hospital encounter of 02/28/13  CT Cervical Spine Wo Contrast   Narrative CLINICAL DATA:  Dysphagia with bilateral arm numbness. Patient underwent cervical fusion 10/30/2012 and fell five days ago.  EXAM: CT CERVICAL SPINE WITHOUT CONTRAST  TECHNIQUE: Multidetector CT imaging of the cervical spine was performed without intravenous contrast. Multiplanar CT image reconstructions were also generated.  COMPARISON:  DG CERVICAL SPINE 2-3 VIEWS  dated 10/30/2012; DG C-ARM 1-60 MIN dated 10/30/2012; MR C SPINE WO/W CM dated 08/21/2011  FINDINGS: Patient underwent revision of  cervical fusion 10/30/2012 with C5-6 ACDF. Both C5 screws are now superiorly displaced into the C4-5 disc. There is lucency surrounding the C5 screws. The C6 screws are intact and well positioned. There is no displacement of the anterior plate or interbody metallic spacer. No solid interbody fusion is present at C5-6.  There is solid interbody fusion at C6-7. The C7 vertebral body screws have been removed.  The alignment is normal. There is no evidence of acute fracture or paraspinal hematoma.  IMPRESSION: 1. Both C5 screws are now superiorly displaced into the C4-5 disc space, a change from the intraoperative radiographs. The anterior plate and interbody spacer are stable in position. 2. No solid interbody fusion at C5-6. 3. Previous C6-7 fusion appears solid. 4. No evidence acute fracture or malalignment.   Electronically Signed   By: Camie Patience M.D.   On: 02/28/2013 15:00    Cervical DG 2-3 views:  Results for orders placed during the hospital encounter of 07/20/10  DG Cervical Spine 2-3 Views   Narrative *RADIOLOGY REPORT*  Clinical Data: Status post C6-7 ACDF  CERVICAL SPINE - 2-3 VIEW  Comparison: Intraoperative imaging  Findings: The superior and midportions of the ACDF hardware at C6- C7 are within normal limits.  The inferior portion of the hardware is obscured by soft tissue artifact.  Allowing for this, there is no evidence for hardware failure.  Minimal reversal of the cervical lordosis at C4-C5 is noted.  IMPRESSION: No visualized hardware abnormality, allowing for incomplete visualization of the inferior aspect of the hardware on the lateral view.  Original Report Authenticated By: Arline Asp, M.D.   Shoulder Imaging: Shoulder-R MR wo contrast:  Results for orders placed in visit on 10/19/15  MR Shoulder Right Wo Contrast   Shoulder-L MR wo contrast:  Results for orders placed during the hospital encounter of 03/20/17  MR SHOULDER LEFT  WO CONTRAST   Narrative CLINICAL DATA:  Left shoulder pain and weakness for 4 months.  EXAM: MRI OF THE LEFT SHOULDER WITHOUT CONTRAST  TECHNIQUE: Multiplanar, multisequence MR imaging of the shoulder was performed. No intravenous contrast was administered.  COMPARISON:  None.  FINDINGS: Examination is somewhat limited by patient motion.  Rotator cuff: Mild to moderate rotator cuff tendinopathy/tendinosis but no partial or full-thickness tear.  Muscles: Edema like signal abnormality in the supraspinatus muscle could be a muscle strain, partial tear or denervation process. The other muscles appear normal.  Biceps long head:  Intact  Acromioclavicular Joint: Mild degenerative changes type 1-2 acromion. No lateral downsloping or undersurface spurring.  Glenohumeral Joint: No degenerative changes or joint effusion. There is thickening of the capsular structures in the axillary recess which can be seen with adhesive capsulitis or synovitis.  Labrum:  No obvious labral tears.  Bones:  No acute bony findings.  Other: Mild subacromial/subdeltoid bursitis.  IMPRESSION: 1. Mild to moderate rotator cuff tendinopathy/tendinosis but no partial or full-thickness tear. 2. Intact long head biceps tendon and glenoid labrum. 3. Mild edema like signal abnormality in the supraspinatus muscle could be due to a muscle strain, partial tear or denervation process. 4. No significant findings for bony impingement. 5. Mild subacromial/subdeltoid bursitis.   Electronically Signed   By: Marijo Sanes M.D.   On: 03/20/2017 11:04    Thoracic Imaging: Thoracic CT wo contrast:  Results for orders placed during the hospital encounter of  01/08/08  CT Thoracic Spine Wo Contrast   Narrative Clinical Data: T10-L4 fusion.  Back and leg pain.   CT THORACIC SPINE WITHOUT CONTRAST   Technique:  Multidetector CT imaging of the thoracic spine was performed without intravenous contrast  administration. Multiplanar CT image reconstructions were also generated   Comparison: Multiple lumbar radiographs and CT examinations earlier this year.   Findings: Posterior fusion bone graft material is present beginning at the T8-9 level bilaterally extending through the operative region.   From T10-L4, there has been placement of bilateral pedicle screws and posterior rods with two crossing bars.  At T10, the pedicle screws are somewhat laterally positioned, more on the right than the left.  No canal encroachment.  Slight lateral positioning is also noted at T11.  No canal encroachment.  Below that, good pedicular  location is achieved at each level without encroachment upon any of the neural spaces.  Nothing encroaches upon the spinal canal at any level.   Changes related to previous anterior discectomy and fusion at L2-3 and L3-4 appear stable.   IMPRESSION: Posterior fusion from T10-L4 with pedicle screws, posterior rods and posterior fusion bone graft placement.  No apparent operative complication.  See above discussion.  Provider: Wilburt Finlay   Lumbosacral Imaging: Lumbar CT wo contrast:  Results for orders placed during the hospital encounter of 05/24/18  CT LUMBAR SPINE WO CONTRAST   Narrative CLINICAL DATA:  Chronic low back and bilateral hip pain. History of prior thoracolumbar surgery.  EXAM: CT LUMBAR SPINE WITHOUT CONTRAST  TECHNIQUE: Multidetector CT imaging of the lumbar spine was performed without intravenous contrast administration. Multiplanar CT image reconstructions were also generated.  COMPARISON:  CT abdomen and pelvis 08/28/2016.  FINDINGS: Segmentation: Standard.  Alignment: Normal.  Vertebrae: Imaging from the inferior endplate of J82 through S3 is provided and demonstrates postoperative change of T12-L4 fusion. Note is made that fusion extends to the T10 on the prior CT. Hardware is intact without loosening. Solid posterior  fusion mass is seen at all imaged levels. Solid bridging bone is also seen across the disc interspaces at L2-3 and L3-4 where interbody spaces are in place. Pedicle screws are appropriately positioned. Defect in the posterior right ilium for bone graft harvest is noted. No pars interarticularis defect is identified. Mineralization is normal.  Paraspinal and other soft tissues: Negative.  Disc levels: T11-12: Widely patent central canal and foramina. No disc bulge or protrusion.  T12-L1: Widely patent central canal and foramina. No bulge or protrusion.  L1-2: Widely patent central canal and foramina. No bulge or protrusion.  L2-3: Status post discectomy and fusion. The central canal and foramina are widely patent.  L3-4: Status post discectomy and fusion. The central canal and foramina are widely patent.  L4-5: Moderate facet degenerative disease, shallow disc bulge and ligamentum flavum thickening. The central canal is widely patent at 13 mm. The foramina are open. The foramina are also widely patent.  L5-S1: Minimal disc bulge and ligamentum flavum thickening without central canal or foraminal stenosis. The central canal measures 14 mm AP.  IMPRESSION: Status post fusion from T10-L4. T10 and T11 are not included on this examination. Hardware is intact and appropriately positioned from T12-L4 with a solid posterior fusion mass at all levels and solid interbody fusion at L2-3 and L3-4 where interbody spacers are in place.  Widely patent central canal and foramina at all levels.  Mild to moderate facet degenerative disease L4-5 and L5-S1 is more notable L4-5.   Electronically  Signed   By: Inge Rise M.D.   On: 05/24/2018 14:59    Lumbar CT w contrast:  Results for orders placed during the hospital encounter of 10/08/12  CT Lumbar Spine W Contrast   Narrative *RADIOLOGY REPORT*  Clinical Data:  Back pain  CT MYELOGRAPHY LUMBAR SPINE  Technique:  CT  imaging of the lumbar spine was performed after intrathecal contrast administration.  Multiplanar CT image reconstructions were also generated.  Comparison:  01/10/2008  Findings: Radiologist injection.  Anatomic alignment of the vertebral bodies.  Bilateral pedicle screws are in place extending from L4 into the thoracic spine with cross stabilizing bars.  Solid bony callus along the posterior elements is noted.  Disc spacers are in place at L2-3 and L3-4 with solid trans disc space effusion.  No breakage or loosening of the hardware.  The conus medullaris terminates at the L1-2 disc.  No new vertebral compression deformity.  Wedging at L3 is stable.  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  L1-2:  Fused level.  Central canal and foramina are widely patent.  L2-3:  Fused level.  Central canal and foramina are widely patent.  L3-4.  Fused level.  Central canal and foramina are widely patent.  L4-5:  Mild facet arthropathy and ligamentum flavum hypertrophy. Mild concentric disc bulge.  No significant central or lateral recess narrowing foramina patent.  L5 S1:  Shallow central protrusion.  No lateral recess or central canal narrowing.  Patent foramina.  Very minimal facet arthropathy.  IMPRESSION: Solid thoracolumbar fusion through L4.  Mild degenerative disc disease at L4-5 and L5 S1 without evidence of impingement.  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   Original Report Authenticated By: Marybelle Killings, M.D.    Lumbar DG 2-3 views:  Results for orders placed during the hospital encounter of 01/08/08  DG Lumbar Spine 2-3 Views   Narrative Clinical Data: Degenerative scoliosis with back pain.   LUMBAR SPINE - 2-3 VIEW   Comparison: 01/02/2008.   Findings: Two intraoperative prone views of the thoracolumbar junction are submitted.  Both films show an esophageal temperature probe and NG tube in place.  The patient has  extensive thoracolumbar fusion beginning at the level of T10 (assuming 12 rib- bearing vertebral bodies) and extending to at least the level of L4.  The left-sided fusion rod ends at the L4 pedicle screw, but the caudal most extent of the right-sided hardware has not been visualized.  Intervertebral graft material is seen at L2-3 and L3- 4.   IMPRESSION: Extensive thoracolumbar fusion with incomplete visualization of the right-sided spinal hardware.  No evidence for immediate hardware complications within the visualized anatomy.  Provider: Roxan Hockey Ward   Lumbar DG Bending views:  Results for orders placed during the hospital encounter of 05/20/18  DG Lumbar Spine Complete W/Bend   Narrative CLINICAL DATA:  58 year old female with chronic bilateral back pain. Initial encounter.  EXAM: LUMBAR SPINE - COMPLETE WITH BENDING VIEWS  COMPARISON:  08/28/2016 CT.  FINDINGS: Fusion T10 through L4 without complication noted. Minimal L4-5 and L5-S1 facet degenerative changes. No abnormal motion occurs between flexion and extension.  Prior right hip replacement with acetabular screw minimally breaching inner cortex of pelvis.  Gas-filled top-normal size splenic flexure of the colon. Stool and gas filled colon otherwise noted. Prior cholecystectomy.  IMPRESSION: 1. Fusion T10 through L4 without complication noted. 2. Minimal L4-5 and L5-S1 facet degenerative changes. 3. No abnormal motion  occurs between flexion and extension.   Electronically Signed   By: Genia Del M.D.   On: 05/21/2018 07:11          Lumbar DG Myelogram views:  Results for orders placed during the hospital encounter of 10/08/12  DG Myelogram Lumbar   Narrative *RADIOLOGY REPORT*  Clinical Data:  Back pain  MYELOGRAM LUMBAR  Technique: The procedure, risks, benefits, and alternatives were explained to the patient. The patient understands and consents. Under  fluoroscopic guidance, a 22 gauge spinal needle was placed in the CSF space via left L5-S1 approach. 20 mL of Omnipaque 180 was injected.  Findings: Bilateral pedicle screws and cross stabilizing bars are present in L4, and L3, L2, L1, T12, T11, and above the upper limit of the study.  There is no breakage or loosening of the hardware. There is also anatomic alignment.   Mild wedging at L3 is stable compared with March 21, 2007.  Disc spacers are in place in the interposed   levels at L2-3 and L3-4.  There is very minimal anterior epidural mass effect at L4-5.  No mass effect at L5-S1.  The central canal is widely patent throughout the lumbar spine.  Flexion and extension views demonstrate no evidence of abnormal motion to suggest instability. There is very limited range of motion in the lumbar spine.  Complications: None.  Fluoroscopy Time: 1 minute and 8 seconds.  IMPRESSION: Thoracolumbar fusion as described.  No evidence of spinal stenosis or hardware failure.   Original Report Authenticated By: Marybelle Killings, M.D.    Hip Imaging: Hip-R MR w contrast:  Results for orders placed during the hospital encounter of 07/26/11  MR Hip Right W Contrast   Narrative *RADIOLOGY REPORT*  Clinical Data: History of torn right labrum 3 years ago.  History of labral repair.  Recurrent hip and groin pain.  MRI WITH INFUSION OF THE RIGHT HIP (MR ARTHROGRAM)  Technique:  Multiplanar, multisequence MR imaging of right hip was performed following the administration of intraarticular contrast.  Comparison: 07/18/2010.  Findings:  There is recurrent tear of the anterior superior labrum of the right hip.  This is at the 10 o'clock position of the acetabular labrum.  Contrast tracks between the acetabulum and labral soft tissue structures.  Minimal chondromalacia is present.  When comparing today's examination to the prior exam of 07/18/2010, the labrum is not completely detached at the  10 o'clock position. The visceral pelvis appears within normal limits.  IMPRESSION: Recurrent anterior superior labral tear at the 10 o'clock position. Mild right hip osteoarthritis.  Original Report Authenticated By: Dereck Ligas, M.D.   Hip-L MR wo contrast:  Results for orders placed during the hospital encounter of 01/11/17  MR HIP LEFT WO CONTRAST   Narrative CLINICAL DATA:  Chronic left hip pain. The patient is status post right hip replacement 5 years ago.  EXAM: MR OF THE LEFT HIP WITHOUT CONTRAST  TECHNIQUE: Multiplanar, multisequence MR imaging was performed. No intravenous contrast was administered.  COMPARISON:  CT abdomen and pelvis 08/28/2016.  FINDINGS: Bones: Artifact from the patient's right hip replacement is noted. Bone marrow signal is normal without fracture, stress change or focal lesion. No subchondral edema or cyst formation about the left hip is identified.  Articular cartilage and labrum  Articular cartilage:  Appears preserved.  Labrum: The anterior, superior labrum appears degenerated but no tear is identified.  Joint or bursal effusion  Joint effusion:  None.  Bursae:  Normal.  Muscles and tendons  Muscles and tendons:  Intact.  Other findings  Miscellaneous: Imaged intrapelvic contents demonstrate no acute abnormality. The patient is status post hysterectomy.  IMPRESSION: Mildly degenerated appearance of the anterior, superior left acetabular labrum. The left hip is otherwise unremarkable.  Status post right hip replacement. No complicating feature is identified.   Electronically Signed   By: Inge Rise M.D.   On: 01/11/2017 13:42    Hand Imaging: Hand-L DG Complete:  Results for orders placed during the hospital encounter of 06/08/09  DG Hand Complete Left   Narrative Clinical Data: 58 year old female status post left hand injury with second ray pain.   LEFT HAND - COMPLETE 3+ VIEW   Comparison: None.    Findings: Bone mineralization is within normal limits. The distal left radius and ulna are intact.  Carpal bone alignment within normal limits.  Joint spaces are within normal limits.  No acute fracture or dislocation.   IMPRESSION: No acute fracture or dislocation identified about the left hand.  Provider: Roderic Ovens   Complexity Note: Imaging results reviewed. Results shared with [name], using Layman's terms.                         Meds   Current Outpatient Medications:  .  albuterol (PROVENTIL HFA) 108 (90 Base) MCG/ACT inhaler, Inhale 1-2 puffs into the lungs every 4 (four) hours as needed. , Disp: , Rfl:  .  ALPRAZolam (XANAX) 1 MG tablet, Take 1 mg by mouth at bedtime as needed for anxiety., Disp: , Rfl:  .  buPROPion (WELLBUTRIN XL) 300 MG 24 hr tablet, TAKE 1 TABLET BY MOUTH EVERY DAY, Disp: 90 tablet, Rfl: 2 .  dexlansoprazole (DEXILANT) 60 MG capsule, Take 1 capsule (60 mg total) by mouth daily., Disp: 30 capsule, Rfl: 11 .  Docusate Sodium (COLACE PO), Take by mouth daily. , Disp: , Rfl:  .  DULoxetine (CYMBALTA) 30 MG capsule, TAKE 1 CAPSULE BY MOUTH EVERY DAY, Disp: 30 capsule, Rfl: 1 .  Fluticasone-Salmeterol (ADVAIR) 500-50 MCG/DOSE AEPB, Inhale 1 puff into the lungs 2 (two) times daily., Disp: , Rfl:  .  metoprolol succinate (TOPROL-XL) 50 MG 24 hr tablet, TAKE 1 TABLET BY MOUTH EVERY DAY, Disp: 90 tablet, Rfl: 1 .  montelukast (SINGULAIR) 10 MG tablet, TAKE 1 TABLET (10 MG TOTAL) BY MOUTH DAILY., Disp: 90 tablet, Rfl: 1 .  ondansetron (ZOFRAN) 4 MG tablet, Take 1 tablet (4 mg total) by mouth every 8 (eight) hours as needed for nausea or vomiting., Disp: 30 tablet, Rfl: 0 .  oxyCODONE (OXY IR/ROXICODONE) 5 MG immediate release tablet, Take 1 tablet (5 mg total) by mouth every 8 (eight) hours as needed for up to 7 days for severe pain., Disp: 21 tablet, Rfl: 0 .  [START ON 07/30/2018] oxyCODONE (OXY IR/ROXICODONE) 5 MG immediate release tablet, Take 1 tablet (5 mg  total) by mouth every 6 (six) hours as needed for up to 30 days for severe pain. Must last 30 days. Max 4/day., Disp: 120 tablet, Rfl: 0 .  [START ON 06/30/2018] oxyCODONE (OXY IR/ROXICODONE) 5 MG immediate release tablet, Take 1 tablet (5 mg total) by mouth every 6 (six) hours as needed for up to 30 days for severe pain. Must last 30 days. Max 4/day., Disp: 120 tablet, Rfl: 0 .  [START ON 05/31/2018] oxyCODONE (OXY IR/ROXICODONE) 5 MG immediate release tablet, Take 1 tablet (5 mg total) by mouth every 6 (six) hours as needed for up  to 30 days for severe pain. Must last 30 days. Max 4/day., Disp: 120 tablet, Rfl: 0 .  Spacer/Aero-Holding Chambers (AEROCHAMBER MV) inhaler, by Does not apply route., Disp: , Rfl:  .  valACYclovir (VALTREX) 1000 MG tablet, TAKE TWO TABLETS BY MOUTH TWICE DAILY FOR ONE DAY FEVER BLISTER, Disp: 4 tablet, Rfl: 5 .  calcium carbonate (CALCIUM 600) 600 MG TABS tablet, Take 1 tablet (600 mg total) by mouth 2 (two) times daily with a meal for 30 days., Disp: 60 tablet, Rfl: 0 .  Cholecalciferol (VITAMIN D3) 125 MCG (5000 UT) CAPS, Take 1 capsule (5,000 Units total) by mouth daily with breakfast. Take along with calcium and magnesium., Disp: 30 capsule, Rfl: 5 .  [START ON 05/30/2018] ergocalciferol (VITAMIN D2) 1.25 MG (50000 UT) capsule, Take 1 capsule (50,000 Units total) by mouth 2 (two) times a week. X 6 weeks., Disp: 12 capsule, Rfl: 0 .  Magnesium 500 MG CAPS, Take 1 capsule (500 mg total) by mouth 2 (two) times daily at 8 am and 10 pm., Disp: 60 capsule, Rfl: 5  ROS  Constitutional: Denies any fever or chills Gastrointestinal: No reported hemesis, hematochezia, vomiting, or acute GI distress Musculoskeletal: Denies any acute onset joint swelling, redness, loss of ROM, or weakness Neurological: No reported episodes of acute onset apraxia, aphasia, dysarthria, agnosia, amnesia, paralysis, loss of coordination, or loss of consciousness  Allergies  Ms. Johal is allergic to  amoxicillin; chlorhexidine gluconate; clindamycin; codeine; erythromycin; penicillin g; sulfa antibiotics; levofloxacin; shellfish allergy; decadron [dexamethasone]; mangifera indica; papaya derivatives; betadine [povidone iodine]; chlorhexidine; clarithromycin; clindamycin/lincomycin; other; povidone-iodine; and prednisone.  [place]  Drug: Ms. Mazurek  reports no history of drug use. Alcohol:  reports no history of alcohol use. Tobacco:  reports that she has never smoked. She has never used smokeless tobacco. 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), Anemia, Anxiety, Asthma, Cephalalgia (08/18/2014), Cervical disc disease (08/18/2014), Chronic headaches, DDD (degenerative disc disease), Depression, Dissociative disorder, Dizziness (04/22/2013), Duodenal ulcer with hemorrhage and perforation ([place]) (04/27/2003), GERD (gastroesophageal reflux disease), H/O arthrodesis (08/18/2014), Headache(784.0), History of blood transfusion, History of cervical spinal surgery (01/04/2015), History of kidney stones, Hypertension, Inverted T wave, Narrowing of intervertebral disc space (08/18/2014), Orthostatic hypotension (04/22/2013), Pain, Pneumonia, PONV (postoperative nausea and vomiting), Postop Hyponatremia (05/14/2012), Postoperative anemia due to acute blood loss (05/14/2012), PTSD (post-traumatic stress disorder), Right foot drop, and Right hip arthralgia (08/18/2014). Surgical: Ms. Yost  has a past surgical history that includes RIGHT HIP ARTHROSCOPY FOR LABRAL TEAR (ABOUT 2010); Anterior fusion cervical spine (MAY 2012); Nasal septum surgery (MARCH 2013); Back surgery (2009); Tonsillectomy; Abdominal hysterectomy; DIAGNOSTIC LAPAROSCOPIES - MULTIPLE FOR ENDOMETRIOSIS; Cholecystectomy; Hip arthroscopy (09/20/2011); Shoulder arthroscopy (05-06-12); Carpal tunnel release (05-06-12); Total hip arthroplasty (Right, 05/13/2012); Anterior cervical decomp/discectomy  fusion (N/A, 10/30/2012); Posterior cervical fusion/foraminotomy (N/A, 04/16/2013); Breast biopsy (Right, 2008); Breast excisional biopsy (Left, 1998); Breast reduction surgery (Bilateral, 06/2016); Colonoscopy with propofol (N/A, 01/26/2017); Esophagogastroduodenoscopy (egd) with propofol (N/A, 01/26/2017); Esophageal dilation (N/A, 01/26/2017); polypectomy (N/A, 01/26/2017); PH impedance study (N/A, 05/30/2017); and Esophageal manometry (N/A, 05/30/2017). Family: family history includes Aneurysm in her maternal grandmother and mother; Anxiety disorder in her grandchild; Bipolar disorder in her grandchild and sister; Breast cancer (age of onset: 16) in her paternal grandmother; Depression in her grandchild.  Constitutional Exam  General appearance: Well nourished, well developed, and well hydrated. In no apparent acute distress Vitals:   05/29/18 1047  BP: (!) 140/95  Pulse: 60  Resp: 18  Temp: 97.9 F (36.6 C)  SpO2: 100%  Weight: 155 lb (70.3 kg)  Height: 5' 4"  (1.626 m)   BMI Assessment: Estimated body mass index is 26.61 kg/m as calculated from the following:   Height as of this encounter: 5' 4"  (1.626 m).   Weight as of this encounter: 155 lb (70.3 kg).  BMI interpretation table: BMI level Category Range association with higher incidence of chronic pain  <18 kg/m2 Underweight   18.5-24.9 kg/m2 Ideal body weight   25-29.9 kg/m2 Overweight Increased incidence by 20%  30-34.9 kg/m2 Obese (Class I) Increased incidence by 68%  35-39.9 kg/m2 Severe obesity (Class II) Increased incidence by 136%  >40 kg/m2 Extreme obesity (Class III) Increased incidence by 254%   Patient's current BMI Ideal Body weight  Body mass index is 26.61 kg/m. Ideal body weight: 54.7 kg (120 lb 9.5 oz) Adjusted ideal body weight: 60.9 kg (134 lb 5.7 oz)   BMI Readings from Last 4 Encounters:  05/29/18 26.61 kg/m  05/20/18 26.61 kg/m  05/10/18 27.09 kg/m  02/26/18 26.43 kg/m   Wt Readings from Last 4  Encounters:  05/29/18 155 lb (70.3 kg)  05/20/18 155 lb (70.3 kg)  05/10/18 157 lb 12.8 oz (71.6 kg)  02/26/18 154 lb (69.9 kg)  Psych/Mental status: Alert, oriented x 3 (person, place, & time)       Eyes: PERLA Respiratory: No evidence of acute respiratory distress  Cervical Spine Area Exam  Skin & Axial Inspection: No masses, redness, edema, swelling, or associated skin lesions Alignment: Symmetrical Functional ROM: Unrestricted ROM      Stability: No instability detected Muscle Tone/Strength: Functionally intact. No obvious neuro-muscular anomalies detected. Sensory (Neurological): Unimpaired Palpation: No palpable anomalies              Upper Extremity (UE) Exam    Side: Right upper extremity  Side: Left upper extremity  Skin & Extremity Inspection: Skin color, temperature, and hair growth are WNL. No peripheral edema or cyanosis. No masses, redness, swelling, asymmetry, or associated skin lesions. No contractures.  Skin & Extremity Inspection: Skin color, temperature, and hair growth are WNL. No peripheral edema or cyanosis. No masses, redness, swelling, asymmetry, or associated skin lesions. No contractures.  Functional ROM: Unrestricted ROM          Functional ROM: Unrestricted ROM          Muscle Tone/Strength: Functionally intact. No obvious neuro-muscular anomalies detected.  Muscle Tone/Strength: Functionally intact. No obvious neuro-muscular anomalies detected.  Sensory (Neurological): Unimpaired          Sensory (Neurological): Unimpaired          Palpation: No palpable anomalies              Palpation: No palpable anomalies              Provocative Test(s):  Phalen's test: deferred Tinel's test: deferred Apley's scratch test (touch opposite shoulder):  Action 1 (Across chest): deferred Action 2 (Overhead): deferred Action 3 (LB reach): deferred   Provocative Test(s):  Phalen's test: deferred Tinel's test: deferred Apley's scratch test (touch opposite shoulder):   Action 1 (Across chest): deferred Action 2 (Overhead): deferred Action 3 (LB reach): deferred    Thoracic Spine Area Exam  Skin & Axial Inspection: No masses, redness, or swelling Alignment: Symmetrical Functional ROM: Unrestricted ROM Stability: No instability detected Muscle Tone/Strength: Functionally intact. No obvious neuro-muscular anomalies detected. Sensory (Neurological): Unimpaired Muscle strength & Tone: No palpable anomalies  Lumbar  Spine Area Exam  Skin & Axial Inspection: No masses, redness, or swelling Alignment: Symmetrical Functional ROM: Decreased ROM affecting both sides Stability: No instability detected Muscle Tone/Strength: Guarding detected Sensory (Neurological): Movement-associated pain Palpation: Complains of area being tender to palpation       Provocative Tests: Hyperextension/rotation test: (+) bilaterally for facet joint pain. Lumbar quadrant test (Kemp's test): (+) bilaterally for facet joint pain. Lateral bending test: (-)       Patrick's Maneuver: (+) for left-sided S-I arthralgia and for bilateral hip arthralgia FABER* test: (+) for left-sided S-I arthralgia and for bilateral hip arthralgia  *(Flexion, ABduction and External Rotation)  Gait & Posture Assessment  Ambulation: Unassisted Gait: Relatively normal for age and body habitus Posture: WNL   Lower Extremity Exam    Side: Right lower extremity  Side: Left lower extremity  Stability: No instability observed          Stability: No instability observed          Skin & Extremity Inspection: Skin color, temperature, and hair growth are WNL. No peripheral edema or cyanosis. No masses, redness, swelling, asymmetry, or associated skin lesions. No contractures.  Skin & Extremity Inspection: Skin color, temperature, and hair growth are WNL. No peripheral edema or cyanosis. No masses, redness, swelling, asymmetry, or associated skin lesions. No contractures.  Functional ROM: Unrestricted ROM                   Functional ROM: Unrestricted ROM                  Muscle Tone/Strength: Functionally intact. No obvious neuro-muscular anomalies detected.  Muscle Tone/Strength: Functionally intact. No obvious neuro-muscular anomalies detected.  Sensory (Neurological): Unimpaired        Sensory (Neurological): Unimpaired        DTR: Patellar: deferred today Achilles: deferred today Plantar: deferred today  DTR: Patellar: deferred today Achilles: deferred today Plantar: deferred today  Palpation: No palpable anomalies  Palpation: No palpable anomalies   Assessment   Status Diagnosis  Persistent Unimproved Unimproved 1. Chronic low back pain (Primary area of Pain) (Bilateral) (L>R) w/o sciatica   2. Lumbar facet syndrome (Bilateral) (L>R)   3. Chronic sacroiliac joint pain (Left)   4. Chronic hip pain (Secondary Area of Pain) (Bilateral) (L>R)   5. History of total hip replacement (THR) (Right)   6. Chronic shoulder pain (Tertiary Area of Pain) (Bilateral) (L>R)   7. Chronic pain syndrome   8. Pharmacologic therapy   9. Long term prescription benzodiazepine use   10. Long term current use of opiate analgesic   11. Vitamin D deficiency   12. History of lumbar surgery   13. History of cervical spinal surgery      Updated Problems: Problem  History of Lumbar Surgery  Chronic hip pain (Secondary Area of Pain) (Bilateral) (L>R)  Chronic low back pain (Primary area of Pain) (Bilateral) (L>R) w/o sciatica  Chronic shoulder pain (Tertiary Area of Pain)  (Bilateral) (L>R)  Chronic sacroiliac joint pain (Left)    Plan of Care  Pharmacotherapy (Medications Ordered): Meds ordered this encounter  Medications  . ergocalciferol (VITAMIN D2) 1.25 MG (50000 UT) capsule    Sig: Take 1 capsule (50,000 Units total) by mouth 2 (two) times a week. X 6 weeks.    Dispense:  12 capsule    Refill:  0    Do not add this medication to the electronic "Automatic Refill" notification system. Patient may  have prescription filled  one day early if pharmacy is closed on scheduled refill date.  . Cholecalciferol (VITAMIN D3) 125 MCG (5000 UT) CAPS    Sig: Take 1 capsule (5,000 Units total) by mouth daily with breakfast. Take along with calcium and magnesium.    Dispense:  30 capsule    Refill:  5    Do not place medication on "Automatic Refill".  May substitute with similar over-the-counter product.  . Magnesium 500 MG CAPS    Sig: Take 1 capsule (500 mg total) by mouth 2 (two) times daily at 8 am and 10 pm.    Dispense:  60 capsule    Refill:  5    Do not place medication on "Automatic Refill".  The patient may use similar over-the-counter product.  . calcium carbonate (CALCIUM 600) 600 MG TABS tablet    Sig: Take 1 tablet (600 mg total) by mouth 2 (two) times daily with a meal for 30 days.    Dispense:  60 tablet    Refill:  0    Do not place medication on "Automatic Refill". Fill one day early if pharmacy is closed on scheduled refill date.  Marland Kitchen oxyCODONE (OXY IR/ROXICODONE) 5 MG immediate release tablet    Sig: Take 1 tablet (5 mg total) by mouth every 6 (six) hours as needed for up to 30 days for severe pain. Must last 30 days. Max 4/day.    Dispense:  120 tablet    Refill:  0    [place] STOP ACT - Not applicable to Chronic Pain Syndrome (G89.4) diagnosis. Fill one day early if pharmacy is closed on scheduled refill date. Do not fill until: 07/30/18. To last until: 08/29/18.  Marland Kitchen oxyCODONE (OXY IR/ROXICODONE) 5 MG immediate release tablet    Sig: Take 1 tablet (5 mg total) by mouth every 6 (six) hours as needed for up to 30 days for severe pain. Must last 30 days. Max 4/day.    Dispense:  120 tablet    Refill:  0    [place] STOP ACT - Not applicable to Chronic Pain Syndrome (G89.4) diagnosis. Fill one day early if pharmacy is closed on scheduled refill date. Do not fill until: 06/30/18. To last until: 07/30/18.  Marland Kitchen oxyCODONE (OXY IR/ROXICODONE) 5 MG immediate release tablet    Sig: Take 1 tablet (5 mg  total) by mouth every 6 (six) hours as needed for up to 30 days for severe pain. Must last 30 days. Max 4/day.    Dispense:  120 tablet    Refill:  0    [place] STOP ACT - Not applicable to Chronic Pain Syndrome (G89.4) diagnosis. Fill one day early if pharmacy is closed on scheduled refill date. Do not fill until: 05/31/18. To last until: 06/30/18.   Medications administered today: Octa Uplinger. Matsushima had no medications administered during this visit.  Orders:  Orders Placed This Encounter  Procedures  . LUMBAR FACET(MEDIAL BRANCH NERVE BLOCK) MBNB    Standing Status:   Future    Standing Expiration Date:   06/29/2018    Scheduling Instructions:     Side: Bilateral     Level: L3-4, L4-5, & L5-S1 Facets (L2, L3, L4, L5, & S1 Medial Branch Nerves)     Sedation: Patient's choice.     Timeframe: ASAA    Order Specific Question:   Where will this procedure be performed?    Answer:   [facility]  . LUMBAR FACET(MEDIAL BRANCH NERVE BLOCK) MBNB    Standing Status:  Standing    Number of Occurrences:   5    Standing Expiration Date:   05/29/2019    Scheduling Instructions:     Purpose: Diagnostic     Indication: Axial low back pain. Lumbosacral Spondylosis (M47.897).      Side: Bilateral     Level: L3-4, L4-5, & L5-S1 Facets (L2, L3, L4, L5, & S1 Medial Branch Nerves)     Sedation: With Sedation.     TIMEFRAME: PRN procedure. (Ms. Manganelli will call when needed.)    Order Specific Question:   Where will this procedure be performed?    Answer:   [facility]  . ToxASSURE Select 13 (MW), Urine    Volume: 30 ml(s). Minimum 3 ml of urine is needed. Document temperature of fresh sample. Indications: Long term (current) use of opiate analgesic (Z79.891)    Lab Orders     ToxASSURE Select 13 (MW), Urine Imaging Orders  No imaging studies ordered today   Referral Orders  No referral(s) requested today   Planned follow-up:   Return for Procedure (w/ sedation): (B) L-FCT BLK #1.     Interventional management options: Considering:   Palliativebilateral suprascapular nerve block (without steroids) #3  Possible right-sided suprascapular nerve RFA #1   Palliative PRN treatment(s):   Palliativebilateral suprascapular nerve block Palliative left-sided suprascapular nerve RFA #2(last done on 01/04/2017)   No future appointments. Primary Care Physician: Shelby Crews, [place] Location: [facility] Note by: [name] Date: 05/29/2018; Time: 4:29 PM

## 2018-05-27 LAB — SEDIMENTATION RATE: Sed Rate: 7 mm/hr (ref 0–40)

## 2018-05-27 LAB — 25-HYDROXYVITAMIN D LCMS D2+D3: 25-HYDROXY, VITAMIN D: 13 ng/mL — AB

## 2018-05-27 LAB — VITAMIN B12: Vitamin B-12: 397 pg/mL (ref 232–1245)

## 2018-05-27 LAB — 25-HYDROXY VITAMIN D LCMS D2+D3
25-Hydroxy, Vitamin D-2: 3.7 ng/mL
25-Hydroxy, Vitamin D-3: 8.9 ng/mL

## 2018-05-27 LAB — C-REACTIVE PROTEIN: CRP: 5 mg/L (ref 0–10)

## 2018-05-27 LAB — MAGNESIUM: Magnesium: 2.1 mg/dL (ref 1.6–2.3)

## 2018-05-28 DIAGNOSIS — Z9889 Other specified postprocedural states: Secondary | ICD-10-CM | POA: Insufficient documentation

## 2018-05-28 DIAGNOSIS — E559 Vitamin D deficiency, unspecified: Secondary | ICD-10-CM | POA: Insufficient documentation

## 2018-05-29 ENCOUNTER — Other Ambulatory Visit: Payer: Self-pay

## 2018-05-29 ENCOUNTER — Ambulatory Visit: Payer: Medicare Other | Attending: Pain Medicine | Admitting: Pain Medicine

## 2018-05-29 ENCOUNTER — Encounter: Payer: Self-pay | Admitting: Pain Medicine

## 2018-05-29 VITALS — BP 140/95 | HR 60 | Temp 97.9°F | Resp 18 | Ht 64.0 in | Wt 155.0 lb

## 2018-05-29 DIAGNOSIS — G894 Chronic pain syndrome: Secondary | ICD-10-CM | POA: Diagnosis not present

## 2018-05-29 DIAGNOSIS — E559 Vitamin D deficiency, unspecified: Secondary | ICD-10-CM

## 2018-05-29 DIAGNOSIS — Z79891 Long term (current) use of opiate analgesic: Secondary | ICD-10-CM

## 2018-05-29 DIAGNOSIS — G8929 Other chronic pain: Secondary | ICD-10-CM

## 2018-05-29 DIAGNOSIS — M47816 Spondylosis without myelopathy or radiculopathy, lumbar region: Secondary | ICD-10-CM

## 2018-05-29 DIAGNOSIS — M25552 Pain in left hip: Secondary | ICD-10-CM | POA: Diagnosis not present

## 2018-05-29 DIAGNOSIS — Z96641 Presence of right artificial hip joint: Secondary | ICD-10-CM | POA: Diagnosis not present

## 2018-05-29 DIAGNOSIS — M533 Sacrococcygeal disorders, not elsewhere classified: Secondary | ICD-10-CM

## 2018-05-29 DIAGNOSIS — M545 Low back pain: Secondary | ICD-10-CM | POA: Diagnosis not present

## 2018-05-29 DIAGNOSIS — M25551 Pain in right hip: Secondary | ICD-10-CM | POA: Diagnosis not present

## 2018-05-29 DIAGNOSIS — M25512 Pain in left shoulder: Secondary | ICD-10-CM | POA: Diagnosis not present

## 2018-05-29 DIAGNOSIS — Z79899 Other long term (current) drug therapy: Secondary | ICD-10-CM

## 2018-05-29 DIAGNOSIS — Z9889 Other specified postprocedural states: Secondary | ICD-10-CM | POA: Diagnosis not present

## 2018-05-29 DIAGNOSIS — M25511 Pain in right shoulder: Secondary | ICD-10-CM

## 2018-05-29 MED ORDER — OXYCODONE HCL 5 MG PO TABS: 5.0000 mg | ORAL_TABLET | Freq: Four times a day (QID) | ORAL | 0 refills | Status: DC | PRN

## 2018-05-29 MED ORDER — MAGNESIUM 500 MG PO CAPS: 500.0000 mg | ORAL_CAPSULE | Freq: Two times a day (BID) | ORAL | 5 refills | Status: AC

## 2018-05-29 MED ORDER — CALCIUM CARBONATE 600 MG PO TABS: 600.0000 mg | ORAL_TABLET | Freq: Two times a day (BID) | ORAL | 0 refills | Status: DC

## 2018-05-29 MED ORDER — VITAMIN D3 125 MCG (5000 UT) PO CAPS: 1.0000 | ORAL_CAPSULE | Freq: Every day | ORAL | 5 refills | Status: DC

## 2018-05-29 MED ORDER — ERGOCALCIFEROL 1.25 MG (50000 UT) PO CAPS: 50000.0000 [IU] | ORAL_CAPSULE | ORAL | 0 refills | Status: AC

## 2018-05-29 NOTE — Patient Instructions (Signed)

## 2018-05-29 NOTE — Progress Notes (Signed)
Nursing Pain Medication Assessment:  Safety precautions to be maintained throughout the outpatient stay will include: orient to surroundings, keep bed in low position, maintain call bell within reach at all times, provide assistance with transfer out of bed and ambulation.  Medication Inspection Compliance: Pill count conducted under aseptic conditions, in front of the patient. Neither the pills nor the bottle was removed from the patient's sight at any time. Once count was completed pills were immediately returned to the patient in their original bottle.  Medication: Oxycodone IR Pill/Patch Count: 14 of 21 pills remain Pill/Patch Appearance: Markings consistent with prescribed medication Bottle Appearance: Standard pharmacy container. Clearly labeled. Filled Date: 03 / 16/ 2020 Last Medication intake:  Today

## 2018-05-30 DIAGNOSIS — G894 Chronic pain syndrome: Secondary | ICD-10-CM | POA: Diagnosis not present

## 2018-05-30 DIAGNOSIS — Z79899 Other long term (current) drug therapy: Secondary | ICD-10-CM | POA: Diagnosis not present

## 2018-06-05 LAB — TOXASSURE SELECT 13 (MW), URINE

## 2018-06-23 ENCOUNTER — Other Ambulatory Visit: Payer: Self-pay | Admitting: Physician Assistant

## 2018-06-23 DIAGNOSIS — J45909 Unspecified asthma, uncomplicated: Secondary | ICD-10-CM

## 2018-07-15 ENCOUNTER — Other Ambulatory Visit: Payer: Self-pay | Admitting: Family Medicine

## 2018-07-15 NOTE — Telephone Encounter (Signed)
Pt needs a refill on    Ondansetron 4 mg  [facility] [place]  [phone]  Thanks teri

## 2018-07-17 MED ORDER — ONDANSETRON HCL 4 MG PO TABS: 4.0000 mg | ORAL_TABLET | Freq: Three times a day (TID) | ORAL | 0 refills | Status: DC | PRN

## 2018-07-23 ENCOUNTER — Telehealth: Payer: Self-pay

## 2018-07-23 NOTE — Telephone Encounter (Signed)
I think patient is going to require an evisit or in office with another provider if she cannot do an evisit.

## 2018-07-23 NOTE — Telephone Encounter (Signed)
Patient of Dr. Jacinto Reap. Patient states she believes one of her disc ruptured in her back and would like a referral to orthopedics. Patient states she could barely walk. Please advise.

## 2018-07-24 NOTE — Telephone Encounter (Signed)
LMTCB to schedule e-visit or OV with different provider.

## 2018-07-25 NOTE — Telephone Encounter (Signed)
Pt called back to schd appt

## 2018-07-26 ENCOUNTER — Encounter: Payer: Self-pay | Admitting: Physician Assistant

## 2018-07-26 ENCOUNTER — Ambulatory Visit (INDEPENDENT_AMBULATORY_CARE_PROVIDER_SITE_OTHER): Payer: Medicare Other | Admitting: Physician Assistant

## 2018-07-26 ENCOUNTER — Other Ambulatory Visit: Payer: Self-pay

## 2018-07-26 VITALS — BP 122/83 | HR 76 | Temp 98.5°F | Resp 16 | Wt 164.2 lb

## 2018-07-26 DIAGNOSIS — M5136 Other intervertebral disc degeneration, lumbar region: Secondary | ICD-10-CM | POA: Diagnosis not present

## 2018-07-26 DIAGNOSIS — M533 Sacrococcygeal disorders, not elsewhere classified: Secondary | ICD-10-CM | POA: Diagnosis not present

## 2018-07-26 DIAGNOSIS — M961 Postlaminectomy syndrome, not elsewhere classified: Secondary | ICD-10-CM | POA: Diagnosis not present

## 2018-07-26 DIAGNOSIS — G894 Chronic pain syndrome: Secondary | ICD-10-CM

## 2018-07-26 DIAGNOSIS — M545 Low back pain, unspecified: Secondary | ICD-10-CM

## 2018-07-26 DIAGNOSIS — M47816 Spondylosis without myelopathy or radiculopathy, lumbar region: Secondary | ICD-10-CM

## 2018-07-26 DIAGNOSIS — G8929 Other chronic pain: Secondary | ICD-10-CM

## 2018-07-26 DIAGNOSIS — N951 Menopausal and female climacteric states: Secondary | ICD-10-CM

## 2018-07-26 NOTE — Progress Notes (Signed)
Patient: [name] Female    DOB: 1960/09/16   58 y.o.   MRN: [id number] Visit Date: 07/26/2018  Today's Provider: Mar Daring, PA-C   Chief Complaint  Patient presents with  . Back Pain   Subjective:     HPI  Patient here with c/o chronic back pain. She sees Dr. Dossie Arbour for pain management. She reports that it feels like one of the disc rupture. She has had a ruptured disc in the past that has required surgery and states this feels similar. Pain radiates across the low back bilaterally and sends shooting pain down her left leg posteriorly. She has had extensive back surgeries in the past and a CT from 05/24/18 did show all hardware was intact and there was a small disc bulge at L4-L5 and L5-S1. Previous back surgeries were completed with Dr. Rolena Infante. She reports she has called their office and was told that they no longer perform surgeries.   Patient also with c/o sweats. Reports that this has been going on for a while (3 years) and everyone keeps saying that she is going through menopause but she reports that she "already went through menopause." Upon further discussion with patient she reports she had a hysterectomy (ovaries left in tact) in her 28s and had menopausal symptoms then, not as severe, and was told she was in menopause from the hysterectomy.   Allergies  Allergen Reactions  . Amoxicillin Hives  . Chlorhexidine Gluconate Dermatitis and Hives  . Clindamycin Hives  . Codeine Hives  . Erythromycin Hives    "mycins" in general  . Penicillin G Hives    "cillins" in general  . Sulfa Antibiotics Nausea And Vomiting and Hives       . Levofloxacin Hives  . Shellfish Allergy Hives  . Decadron [Dexamethasone] Other (See Comments)    Hot flashes, insomnia, "manic" Hot flashes, insomnia, "manic"  . Mangifera Indica Hives    papaya  . Papaya Derivatives Hives  . Betadine [Povidone Iodine] Hives       . Chlorhexidine Hives       . Clarithromycin Hives  .  Clindamycin/Lincomycin Hives  . Other Hives     Mango   . Povidone-Iodine Hives  . Prednisone Anxiety    High blood pressure, flushed, mood changes, heart palpitations High blood pressure, flushed, mood changes, heart palpitations     Current Outpatient Medications:  .  albuterol (PROVENTIL HFA) 108 (90 Base) MCG/ACT inhaler, Inhale 1-2 puffs into the lungs every 4 (four) hours as needed. , Disp: , Rfl:  .  ALPRAZolam (XANAX) 1 MG tablet, Take 1 mg by mouth at bedtime as needed for anxiety., Disp: , Rfl:  .  buPROPion (WELLBUTRIN XL) 300 MG 24 hr tablet, TAKE 1 TABLET BY MOUTH EVERY DAY, Disp: 90 tablet, Rfl: 2 .  calcium carbonate (CALCIUM 600) 600 MG TABS tablet, Take 1 tablet (600 mg total) by mouth 2 (two) times daily with a meal for 30 days., Disp: 60 tablet, Rfl: 0 .  Cholecalciferol (VITAMIN D3) 125 MCG (5000 UT) CAPS, Take 1 capsule (5,000 Units total) by mouth daily with breakfast. Take along with calcium and magnesium., Disp: 30 capsule, Rfl: 5 .  dexlansoprazole (DEXILANT) 60 MG capsule, Take 1 capsule (60 mg total) by mouth daily., Disp: 30 capsule, Rfl: 11 .  Docusate Sodium (COLACE PO), Take by mouth daily. , Disp: , Rfl:  .  DULoxetine (CYMBALTA) 30 MG capsule, TAKE 1 CAPSULE BY  MOUTH EVERY DAY, Disp: 30 capsule, Rfl: 1 .  Fluticasone-Salmeterol (ADVAIR) 500-50 MCG/DOSE AEPB, Inhale 1 puff into the lungs 2 (two) times daily., Disp: , Rfl:  .  Magnesium 500 MG CAPS, Take 1 capsule (500 mg total) by mouth 2 (two) times daily at 8 am and 10 pm., Disp: 60 capsule, Rfl: 5 .  metoprolol succinate (TOPROL-XL) 50 MG 24 hr tablet, TAKE 1 TABLET BY MOUTH EVERY DAY, Disp: 90 tablet, Rfl: 1 .  montelukast (SINGULAIR) 10 MG tablet, TAKE 1 TABLET (10 MG TOTAL) BY MOUTH DAILY., Disp: 90 tablet, Rfl: 1 .  ondansetron (ZOFRAN) 4 MG tablet, Take 1 tablet (4 mg total) by mouth every 8 (eight) hours as needed for nausea or vomiting., Disp: 30 tablet, Rfl: 0 .  [START ON 07/30/2018] oxyCODONE (OXY  IR/ROXICODONE) 5 MG immediate release tablet, Take 1 tablet (5 mg total) by mouth every 6 (six) hours as needed for up to 30 days for severe pain. Must last 30 days. Max 4/day., Disp: 120 tablet, Rfl: 0 .  Spacer/Aero-Holding Chambers (AEROCHAMBER MV) inhaler, by Does not apply route., Disp: , Rfl:  .  valACYclovir (VALTREX) 1000 MG tablet, TAKE TWO TABLETS BY MOUTH TWICE DAILY FOR ONE DAY FEVER BLISTER, Disp: 4 tablet, Rfl: 5 .  oxyCODONE (OXY IR/ROXICODONE) 5 MG immediate release tablet, Take 1 tablet (5 mg total) by mouth every 8 (eight) hours as needed for up to 7 days for severe pain., Disp: 21 tablet, Rfl: 0 .  oxyCODONE (OXY IR/ROXICODONE) 5 MG immediate release tablet, Take 1 tablet (5 mg total) by mouth every 6 (six) hours as needed for up to 30 days for severe pain. Must last 30 days. Max 4/day., Disp: 120 tablet, Rfl: 0 .  oxyCODONE (OXY IR/ROXICODONE) 5 MG immediate release tablet, Take 1 tablet (5 mg total) by mouth every 6 (six) hours as needed for up to 30 days for severe pain. Must last 30 days. Max 4/day., Disp: 120 tablet, Rfl: 0  Review of Systems  Constitutional: Positive for activity change and diaphoresis. Negative for fatigue and fever.  HENT: Negative.   Respiratory: Negative.   Cardiovascular: Negative.   Musculoskeletal: Positive for arthralgias, back pain, gait problem, myalgias, neck pain and neck stiffness. Negative for joint swelling.  Neurological: Positive for weakness and numbness.    Social History   Tobacco Use  . Smoking status: Never Smoker  . Smokeless tobacco: Never Used  Substance Use Topics  . Alcohol use: No    Alcohol/week: 0.0 standard drinks      Objective:   BP 122/83 (BP Location: Left Arm, Patient Position: Sitting, Cuff Size: Large)   Pulse 76   Temp 98.5 F (36.9 C) (Oral)   Resp 16   Wt 164 lb 3.2 oz (74.5 kg)   BMI 28.18 kg/m  Vitals:   07/26/18 1037  BP: 122/83  Pulse: 76  Resp: 16  Temp: 98.5 F (36.9 C)  TempSrc: Oral   Weight: 164 lb 3.2 oz (74.5 kg)     Physical Exam Vitals signs reviewed.  Constitutional:      General: She is not in acute distress.    Appearance: Normal appearance. She is well-developed. She is not ill-appearing or diaphoretic.  HENT:     Head: Normocephalic and atraumatic.  Eyes:     General: No scleral icterus. Neck:     Musculoskeletal: Neck supple. Decreased range of motion. Muscular tenderness present. No spinous process tenderness.  Cardiovascular:     Rate  and Rhythm: Normal rate and regular rhythm.     Heart sounds: Normal heart sounds. No murmur. No friction rub. No gallop.   Pulmonary:     Effort: Pulmonary effort is normal. No respiratory distress.     Breath sounds: Normal breath sounds. No wheezing or rales.  Musculoskeletal:     Lumbar back: She exhibits decreased range of motion, tenderness, bony tenderness and pain. She exhibits no spasm.       Back:  Neurological:     Mental Status: She is alert.     CLINICAL DATA:  Chronic low back and bilateral hip pain. History of prior thoracolumbar surgery.  EXAM: CT LUMBAR SPINE WITHOUT CONTRAST 05/24/18  TECHNIQUE: Multidetector CT imaging of the lumbar spine was performed without intravenous contrast administration. Multiplanar CT image reconstructions were also generated.  COMPARISON:  CT abdomen and pelvis 08/28/2016.  FINDINGS: Segmentation: Standard.  Alignment: Normal.  Vertebrae: Imaging from the inferior endplate of M09 through S3 is provided and demonstrates postoperative change of T12-L4 fusion. Note is made that fusion extends to the T10 on the prior CT. Hardware is intact without loosening. Solid posterior fusion mass is seen at all imaged levels. Solid bridging bone is also seen across the disc interspaces at L2-3 and L3-4 where interbody spaces are in place. Pedicle screws are appropriately positioned. Defect in the posterior right ilium for bone graft harvest is noted. No pars  interarticularis defect is identified. Mineralization is normal.  Paraspinal and other soft tissues: Negative.  Disc levels: T11-12: Widely patent central canal and foramina. No disc bulge or protrusion.  T12-L1: Widely patent central canal and foramina. No bulge or protrusion.  L1-2: Widely patent central canal and foramina. No bulge or protrusion.  L2-3: Status post discectomy and fusion. The central canal and foramina are widely patent.  L3-4: Status post discectomy and fusion. The central canal and foramina are widely patent.  L4-5: Moderate facet degenerative disease, shallow disc bulge and ligamentum flavum thickening. The central canal is widely patent at 13 mm. The foramina are open. The foramina are also widely patent.  L5-S1: Minimal disc bulge and ligamentum flavum thickening without central canal or foraminal stenosis. The central canal measures 14 mm AP.  IMPRESSION: Status post fusion from T10-L4. T10 and T11 are not included on this examination. Hardware is intact and appropriately positioned from T12-L4 with a solid posterior fusion mass at all levels and solid interbody fusion at L2-3 and L3-4 where interbody spacers are in place.  Widely patent central canal and foramina at all levels.  Mild to moderate facet degenerative disease L4-5 and L5-S1 is more notable L4-5.   Electronically Signed   By: Inge Rise M.D.   On: 05/24/2018 14:59    Assessment & Plan    1. Lumbar spondylosis Referral made to neurosurgery for worsening acute on chronic low back pain with known disc disease. Case complicated by multiple previous back surgeries. She is also followed by pain management.  - Ambulatory referral to Neurosurgery  2. Other intervertebral disc degeneration, lumbar region See above medical treatment plan. 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 - Ambulatory referral to Neurosurgery  4. Chronic sacroiliac joint pain (Left) See above medical treatment plan. - Ambulatory referral to Neurosurgery  5. Chronic pain syndrome See above medical treatment plan. - Ambulatory referral to Neurosurgery  6. Chronic low back pain (Primary area of Pain) (Bilateral) (L>R) w/o  sciatica See above medical treatment plan. - Ambulatory referral to Neurosurgery  7. Menopausal syndrome (hot flashes) Discussed that patient most likely had mild postsurgical menopausal symptoms when she was in her 35s and is most likely, and has been over the last 3 years, going through true ovarian failure menopause. She voiced understanding. She is on wellbutrin and cymbalta, both which can worsen hot flashes as well. May consider paroxetine, escitalopram or venlafaxine for depression/GAD with also benefit of controlling hot flashes/ sweats. May discuss with her psychiatrist. Also can use black cohosh OTC for hot flashes but would need monitoring of LFTs every 3-6 months while taking. She will think about options and call if worsening.      Mar Daring, PA-C  [facility]

## 2018-07-29 ENCOUNTER — Telehealth: Payer: Self-pay

## 2018-07-29 NOTE — Telephone Encounter (Signed)
She called and left vm stating the oxycodone 5mg  is not touching her pain, she is taking tylenol and ibuprofen along with it. She wants to know if he can increase her dosage or try something else. Shall I make her a VV?

## 2018-07-29 NOTE — Telephone Encounter (Signed)
Yes, please.

## 2018-07-30 ENCOUNTER — Encounter: Payer: Self-pay | Admitting: Pain Medicine

## 2018-07-31 ENCOUNTER — Ambulatory Visit: Payer: Medicare Other | Attending: Pain Medicine | Admitting: Pain Medicine

## 2018-07-31 ENCOUNTER — Other Ambulatory Visit: Payer: Self-pay

## 2018-07-31 DIAGNOSIS — M25512 Pain in left shoulder: Secondary | ICD-10-CM

## 2018-07-31 DIAGNOSIS — M792 Neuralgia and neuritis, unspecified: Secondary | ICD-10-CM

## 2018-07-31 DIAGNOSIS — M25551 Pain in right hip: Secondary | ICD-10-CM

## 2018-07-31 DIAGNOSIS — G9619 Other disorders of meninges, not elsewhere classified: Secondary | ICD-10-CM

## 2018-07-31 DIAGNOSIS — G894 Chronic pain syndrome: Secondary | ICD-10-CM

## 2018-07-31 DIAGNOSIS — M545 Low back pain, unspecified: Secondary | ICD-10-CM

## 2018-07-31 DIAGNOSIS — Z79899 Other long term (current) drug therapy: Secondary | ICD-10-CM

## 2018-07-31 DIAGNOSIS — M961 Postlaminectomy syndrome, not elsewhere classified: Secondary | ICD-10-CM | POA: Diagnosis not present

## 2018-07-31 DIAGNOSIS — M19012 Primary osteoarthritis, left shoulder: Secondary | ICD-10-CM

## 2018-07-31 DIAGNOSIS — M25552 Pain in left hip: Secondary | ICD-10-CM

## 2018-07-31 DIAGNOSIS — M5137 Other intervertebral disc degeneration, lumbosacral region: Secondary | ICD-10-CM | POA: Diagnosis not present

## 2018-07-31 DIAGNOSIS — G96198 Other disorders of meninges, not elsewhere classified: Secondary | ICD-10-CM

## 2018-07-31 DIAGNOSIS — M25511 Pain in right shoulder: Secondary | ICD-10-CM

## 2018-07-31 DIAGNOSIS — G8929 Other chronic pain: Secondary | ICD-10-CM

## 2018-07-31 DIAGNOSIS — M51379 Other intervertebral disc degeneration, lumbosacral region without mention of lumbar back pain or lower extremity pain: Secondary | ICD-10-CM

## 2018-07-31 DIAGNOSIS — Z79891 Long term (current) use of opiate analgesic: Secondary | ICD-10-CM

## 2018-07-31 MED ORDER — OXYCODONE HCL 5 MG PO TABS: 5.0000 mg | ORAL_TABLET | Freq: Four times a day (QID) | ORAL | 0 refills | Status: DC | PRN

## 2018-07-31 MED ORDER — PREGABALIN 25 MG PO CAPS: ORAL_CAPSULE | ORAL | 0 refills | Status: DC

## 2018-07-31 NOTE — Progress Notes (Signed)
Pain Management Virtual Encounter Note - Virtual Visit via Telephone Telehealth (real-time audio visits between healthcare provider and patient).  Patient's Phone No. & Preferred Pharmacy:  [phone] (home); There is no such number on file (mobile).; (Preferred) [phone] [email or web address]  [id number] [address], [place] [address] Phone: [phone] Fax: [phone]   Pre-screening note:  Our staff contacted [name] and offered her an "in person", "face-to-face" appointment versus a telephone encounter. She indicated preferring the telephone encounter, at this time.  Reason for Virtual Visit: COVID-19*  Social distancing based on CDC and AMA recommendations.   I contacted [name] on 07/31/2018 at 10:47 AM via telephone.      I clearly identified myself as Gaspar Cola, MD. I verified that I was speaking with the correct person using two identifiers (Name and date of birth: 09/06/1960).  Advanced Informed Consent I sought verbal advanced consent from [name] for virtual visit interactions. I informed [name] of possible security and privacy concerns, risks, and limitations associated with providing "not-in-person" medical evaluation and management services. I also informed [name] of the availability of "in-person" appointments. Finally, I informed her that there would be a charge for the virtual visit and that she could be  personally, fully or partially, financially responsible for it. [name] expressed understanding and agreed to proceed.   Historic Elements   [name] is a 58 y.o. year old, female patient evaluated today after her last encounter by our practice on 07/29/2018. 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), Anemia, Anxiety, Asthma, Cephalalgia (08/18/2014), Cervical disc disease (08/18/2014), Chronic  headaches, DDD (degenerative disc disease), Depression, Dissociative disorder, Dizziness (04/22/2013), Duodenal ulcer with hemorrhage and perforation ([place]) (04/27/2003), GERD (gastroesophageal reflux disease), H/O arthrodesis (08/18/2014), Headache(784.0), History of blood transfusion, History of cervical spinal surgery (01/04/2015), History of kidney stones, Hypertension, Inverted T wave, Narrowing of intervertebral disc space (08/18/2014), Orthostatic hypotension (04/22/2013), Pain, Pneumonia, PONV (postoperative nausea and vomiting), Postop Hyponatremia (05/14/2012), Postoperative anemia due to acute blood loss (05/14/2012), PTSD (post-traumatic stress disorder), Right foot drop, and Right hip arthralgia (08/18/2014). She also  has a past surgical history that includes RIGHT HIP ARTHROSCOPY FOR LABRAL TEAR (ABOUT 2010); Anterior fusion cervical spine (MAY 2012); Nasal septum surgery (MARCH 2013); Back surgery (2009); Tonsillectomy; Abdominal hysterectomy; DIAGNOSTIC LAPAROSCOPIES - MULTIPLE FOR ENDOMETRIOSIS; Cholecystectomy; Hip arthroscopy (09/20/2011); Shoulder arthroscopy (05-06-12); Carpal tunnel release (05-06-12); Total hip arthroplasty (Right, 05/13/2012); Anterior cervical decomp/discectomy fusion (N/A, 10/30/2012); Posterior cervical fusion/foraminotomy (N/A, 04/16/2013); Breast biopsy (Right, 2008); Breast excisional biopsy (Left, 1998); Breast reduction surgery (Bilateral, 06/2016); Colonoscopy with propofol (N/A, 01/26/2017); Esophagogastroduodenoscopy (egd) with propofol (N/A, 01/26/2017); Esophageal dilation (N/A, 01/26/2017); polypectomy (N/A, 01/26/2017); PH impedance study (N/A, 05/30/2017); and Esophageal manometry (N/A, 05/30/2017). [name] has a current medication list which includes the following prescription(s): albuterol, alprazolam, bupropion, dexlansoprazole, docusate sodium, duloxetine, fluticasone-salmeterol, magnesium, metoprolol succinate, montelukast, ondansetron, oxycodone, oxycodone, aerochamber mv,  valacyclovir, calcium carbonate, oxycodone, and pregabalin. She  reports that she has never smoked. She has never used smokeless tobacco. She reports that she does not drink alcohol or use drugs. [name] is allergic to amoxicillin; chlorhexidine gluconate; clindamycin; codeine; erythromycin; penicillin g; sulfa antibiotics; levofloxacin; shellfish allergy; decadron [dexamethasone]; mangifera indica; papaya derivatives; betadine [povidone iodine]; chlorhexidine; clarithromycin; clindamycin/lincomycin; other; povidone-iodine; and prednisone.   HPI  I last communicated with her on 05/29/2018. Today, she  is being contacted for medication management.  Today the patient want to discuss her medication regimen.  She is still using benzodiazepines in the form of alprazolam 1 mg p.o. daily, currently prescribed by Rushie Chestnut, [place] w/ [facility] [id number].  Today the patient indicated the she needed more medication until she could go see a neurosurgeon since she had some normal "blown" disks and she would likely end up having surgery.  I went ahead and I checked her imaging studies and the most recent one is a CT scan of the lumbar spine done on 05/24/2018.  I went ahead and read to the patient and the entire study, which directly contradicted her claims.  Clearly she was trying to augment her pain etiology problems in order to justify getting more opioid analgesics.  However, because I already know this case, I went ahead and reviewed everything in great detail and found that there was nothing that would justify an increase in her pain medications.  However, when she was describing her pain she indicated that it felt like her legs were on fire.  This description is typical of neurogenic or neuropathic pain for which there is only 2 medications that tend to be effective for it.  The medications are of the pregabalin and the gabapentin.  Of course, the patient indicated that she had taken these in the  past and they did not help.  However, when I went through her medical records and I reviewed her medications, there is no evidence in the electronic medical record that she has taking either 1 of these medication.  Once again I confronted her with that information and at this point she agreed to do a trial of 1 of the 2.  She indicated that she wanted to try the 1 with the least side effects.  Because of this, I chose the Lyrica.  According to the patient she has tried the Lyrica and the gabapentin in the past, and according to her they were used after her surgeries.  In any case, I instructed the patient to start on the Lyrica 25 mg p.o. twice daily for 2 weeks and then to increase it to 3 times a day.  The patient was reminded that the medicine takes 2 to 3 weeks to get into the system and that she needs to be taking it religiously, and not as if it was a pain medicine, only when there is pain.  She understood and accepted.  Today I renewed her oxycodone IR and I will meet again with her in approximately 30 days to review the results of this Lyrica trial.  If at that time she still having problems and no side effects, then we will plan to continue increasing it as tolerated to a possible maximum of 450 mg/day.  Pharmacotherapy Assessment  Analgesic: Oxycodone IR 5 mg 1 tablet p.o. every 6 hours (20 mg/day of oxycodone) MME/day: 30 mg/day.   Monitoring: Pharmacotherapy: No side-effects or adverse reactions reported.  PMP: PDMP reviewed during this encounter.       Compliance: No problems identified. Effectiveness: Clinically acceptable. Plan: Refer to "POC".  Review of recent tests   Lab Review  Kidney Function Lab Results  Component Value Date   BUN 8 12/25/2017   CREATININE 0.79 12/25/2017   BCR 10 12/25/2017   GFRAA 95 12/25/2017   GFRNONAA 83 12/25/2017  Liver Function Lab Results  Component Value Date   AST 13 12/25/2017   ALT 9 12/25/2017  ALBUMIN 4.3 12/25/2017  Note: Above  Lab results reviewed.  CT LUMBAR SPINE WO CONTRAST CLINICAL DATA:  Chronic low back and bilateral hip pain. History of prior thoracolumbar surgery.  EXAM: CT LUMBAR SPINE WITHOUT CONTRAST  TECHNIQUE: Multidetector CT imaging of the lumbar spine was performed without intravenous contrast administration. Multiplanar CT image reconstructions were also generated.  COMPARISON:  CT abdomen and pelvis 08/28/2016.  FINDINGS: Segmentation: Standard.  Alignment: Normal.  Vertebrae: Imaging from the inferior endplate of W73 through S3 is provided and demonstrates postoperative change of T12-L4 fusion. Note is made that fusion extends to the T10 on the prior CT. Hardware is intact without loosening. Solid posterior fusion mass is seen at all imaged levels. Solid bridging bone is also seen across the disc interspaces at L2-3 and L3-4 where interbody spaces are in place. Pedicle screws are appropriately positioned. Defect in the posterior right ilium for bone graft harvest is noted. No pars interarticularis defect is identified. Mineralization is normal.  Paraspinal and other soft tissues: Negative.  Disc levels: T11-12: Widely patent central canal and foramina. No disc bulge or protrusion.  T12-L1: Widely patent central canal and foramina. No bulge or protrusion.  L1-2: Widely patent central canal and foramina. No bulge or protrusion.  L2-3: Status post discectomy and fusion. The central canal and foramina are widely patent.  L3-4: Status post discectomy and fusion. The central canal and foramina are widely patent.  L4-5: Moderate facet degenerative disease, shallow disc bulge and ligamentum flavum thickening. The central canal is widely patent at 13 mm. The foramina are open. The foramina are also widely patent.  L5-S1: Minimal disc bulge and ligamentum flavum thickening without central canal or foraminal stenosis. The central canal measures 14 mm AP.  IMPRESSION: Status  post fusion from T10-L4. T10 and T11 are not included on this examination. Hardware is intact and appropriately positioned from T12-L4 with a solid posterior fusion mass at all levels and solid interbody fusion at L2-3 and L3-4 where interbody spacers are in place.  Widely patent central canal and foramina at all levels.  Mild to moderate facet degenerative disease L4-5 and L5-S1 is more notable L4-5.  Electronically Signed   By: Inge Rise M.D.   On: 05/24/2018 14:59   Office Visit on 05/29/2018  Component Date Value Ref Range Status  . Summary 05/30/2018 FINAL   Final   Comment: ==================================================================== TOXASSURE SELECT 13 (MW) ==================================================================== Test                             Result       Flag       Units Drug Present and Declared for Prescription Verification   Oxycodone                      559          EXPECTED   ng/mg creat   Noroxycodone                   4595         EXPECTED   ng/mg creat    Sources of oxycodone include scheduled prescription medications.    Noroxycodone is an expected metabolite of oxycodone. Drug Present not Declared for Prescription Verification   Alprazolam                     205  UNEXPECTED ng/mg creat   Alpha-hydroxyalprazolam        127          UNEXPECTED ng/mg creat    Source of alprazolam is a scheduled prescription medication.    Alpha-hydroxyalprazolam is an expected metabolite of alprazolam. ==================================================================== Test                      Result    Flag   Uni                          ts      Ref Range   Creatinine              22               mg/dL      >=20 ==================================================================== Declared Medications:  The flagging and interpretation on this report are based on the  following declared medications.  Unexpected results may arise from   inaccuracies in the declared medications.  **Note: The testing scope of this panel includes these medications:  Oxycodone  **Note: The testing scope of this panel does not include following  reported medications:  Bupropion (Wellbutrin)  Dexlansoprazole (Dexilant)  Duloxetine (Cymbalta)  Fluticasone (Advair)  Magnesium  Metoprolol  Montelukast  Ondansetron  Salmeterol (Advair)  Supplement (OsCal)  Vitamin D2  Vitamin D3 ==================================================================== For clinical consultation, please call [phone]. ====================================================================    Assessment  The primary encounter diagnosis was Chronic pain syndrome. 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  Plan of Care  I have discontinued Jacque Byron. Wool's Vitamin D3. I am also having her start on pregabalin. Additionally, I am having her maintain her AeroChamber MV, albuterol, Docusate Sodium (COLACE PO), dexlansoprazole, buPROPion, DULoxetine, Fluticasone-Salmeterol, valACYclovir, Magnesium, calcium carbonate, ALPRAZolam, metoprolol succinate, montelukast, ondansetron, oxyCODONE, oxyCODONE, and oxyCODONE.  Pharmacotherapy (Medications Ordered): Meds ordered this encounter  Medications  . pregabalin (LYRICA) 25 MG capsule    Sig: Take 1 capsule (25 mg total) by mouth 2 (two) times daily for 15 days, THEN 1 capsule (25 mg total) 3 (three) times daily for 15 days.    Dispense:  75  capsule    Refill:  0    Do not place this medication, or any other prescription from our practice, on "Automatic Refill". Patient may have prescription filled one day early if pharmacy is closed on scheduled refill date.  Marland Kitchen oxyCODONE (OXY IR/ROXICODONE) 5 MG immediate release tablet    Sig: Take 1 tablet (5 mg total) by mouth every 6 (six) hours as needed for up to 30 days for severe pain. Must last 30 days. Max 4/day.    Dispense:  120 tablet    Refill:  0    Chronic Pain. (STOP Act - Not applicable). Fill one day early if closed on scheduled refill date. Do not fill until: 10/28/18. To last until: 11/27/18.  Marland Kitchen oxyCODONE (OXY IR/ROXICODONE) 5 MG immediate release tablet    Sig: Take 1 tablet (5 mg total) by mouth every 6 (six) hours as needed for  up to 30 days for severe pain. Must last 30 days. Max 4/day.    Dispense:  120 tablet    Refill:  0    Chronic Pain. (STOP Act - Not applicable). Fill one day early if closed on scheduled refill date. Do not fill until: 09/28/18. To last until: 10/28/18.  Marland Kitchen oxyCODONE (OXY IR/ROXICODONE) 5 MG immediate release tablet    Sig: Take 1 tablet (5 mg total) by mouth every 6 (six) hours as needed for up to 30 days for severe pain. Must last 30 days. Max 4/day.    Dispense:  120 tablet    Refill:  0    Chronic Pain. (STOP Act - Not applicable). Fill one day early if closed on scheduled refill date. Do not fill until: 08/29/18. To last until: 09/28/18.   Orders:  No orders of the defined types were placed in this encounter.  Follow-up plan:   Return for (1 mo) Med-Mgmt, (Virtual Visit).  In approximately a month from now, we will call her and assess how she is doing on the Lyrica trial.  If she is doing well, but still not getting significant benefit, then we will go ahead and increase the dose further.   I discussed the assessment and treatment plan with the patient. The patient was provided an opportunity to ask questions and all were answered. The  patient agreed with the plan and demonstrated an understanding of the instructions.  Patient advised to call back or seek an in-person evaluation if the symptoms or condition worsens.  Total duration of non-face-to-face encounter: 15 minutes.  Note by: Gaspar Cola, MD Date: 07/31/2018; Time: 11:07 AM  Disclaimer:  * Given the special circumstances of the COVID-19 pandemic, the federal government has announced that the Office for Civil Rights (OCR) will exercise its enforcement discretion and will not impose penalties on physicians using telehealth in the event of noncompliance with regulatory requirements under the [place] and Accountability Act (HIPAA) in connection with the good faith provision of telehealth during the WPYKD-98 national public health emergency. ([place])

## 2018-08-04 ENCOUNTER — Other Ambulatory Visit: Payer: Self-pay | Admitting: Gastroenterology

## 2018-08-08 DIAGNOSIS — G8929 Other chronic pain: Secondary | ICD-10-CM | POA: Diagnosis not present

## 2018-08-08 DIAGNOSIS — M5442 Lumbago with sciatica, left side: Secondary | ICD-10-CM | POA: Diagnosis not present

## 2018-08-22 ENCOUNTER — Telehealth: Payer: Self-pay

## 2018-08-22 NOTE — Telephone Encounter (Signed)
Attempted to call patient and lm to call

## 2018-08-26 ENCOUNTER — Encounter: Payer: Self-pay | Admitting: Pain Medicine

## 2018-08-26 ENCOUNTER — Ambulatory Visit: Payer: Medicare Other | Attending: Pain Medicine | Admitting: Pain Medicine

## 2018-08-26 ENCOUNTER — Other Ambulatory Visit: Payer: Self-pay

## 2018-08-26 DIAGNOSIS — G894 Chronic pain syndrome: Secondary | ICD-10-CM

## 2018-08-26 DIAGNOSIS — M25552 Pain in left hip: Secondary | ICD-10-CM

## 2018-08-26 DIAGNOSIS — M792 Neuralgia and neuritis, unspecified: Secondary | ICD-10-CM

## 2018-08-26 DIAGNOSIS — M545 Low back pain: Secondary | ICD-10-CM

## 2018-08-26 DIAGNOSIS — M25551 Pain in right hip: Secondary | ICD-10-CM

## 2018-08-26 DIAGNOSIS — G8929 Other chronic pain: Secondary | ICD-10-CM

## 2018-08-26 NOTE — Progress Notes (Signed)
Pain Management Virtual Encounter Note - Virtual Visit via Telephone Telehealth (real-time audio visits between healthcare provider and patient).   Patient's Phone No. & Preferred Pharmacy:  [phone] (home); There is no such number on file (mobile).; (Preferred) [phone] [email or web address]  [id number] [address], [place] [address] Phone: [phone] Fax: [phone]    Pre-screening note:  Our staff contacted [name] and offered her an "in person", "face-to-face" appointment versus a telephone encounter. She indicated preferring the telephone encounter, at this time.   Reason for Virtual Visit: COVID-19*  Social distancing based on CDC and AMA recommendations.   I attempted to contact [name] on 08/26/2018 via telephone.  I got a recording indicating that telephone number [phone] is not a valid number.  I also attempted to contact the patient by calling her spouse, but there was no answer. I was unable to complete the visit due to no one answering the phone, or unable to get a proper contact number. I was also unable to leave a message.  Note by: Gaspar Cola, MD Date: 08/26/2018; Time: 6:02 AM

## 2018-09-09 ENCOUNTER — Other Ambulatory Visit: Payer: Self-pay | Admitting: Pain Medicine

## 2018-09-09 ENCOUNTER — Other Ambulatory Visit: Payer: Self-pay | Admitting: Family Medicine

## 2018-09-09 DIAGNOSIS — M792 Neuralgia and neuritis, unspecified: Secondary | ICD-10-CM

## 2018-09-26 ENCOUNTER — Encounter: Payer: Self-pay | Admitting: Pain Medicine

## 2018-09-29 NOTE — Progress Notes (Addendum)
Pain Management Virtual Encounter Note - Virtual Visit via Telephone Telehealth (real-time audio visits between healthcare provider and patient).   Patient's Phone No. & Preferred Pharmacy:  [phone] (home); There is no such number on file (mobile).; (Preferred) [phone] [email or web address]  [id number] [address], [place] [address] Phone: [phone] Fax: [phone]    Pre-screening note:  Our staff contacted Ms. Novick and offered her an "in person", "face-to-face" appointment versus a telephone encounter. She indicated preferring the telephone encounter, at this time.   Reason for Virtual Visit: COVID-19*  Social distancing based on CDC and AMA recommendations.   I contacted [name] on 09/30/2018 via telephone.      I clearly identified myself as Gaspar Cola, MD. I verified that I was speaking with the correct person using two identifiers (Name: ILYA ESS, and date of birth: 05-19-1960).  Advanced Informed Consent I sought verbal advanced consent from [name] for virtual visit interactions. I informed Ms. Erker of possible security and privacy concerns, risks, and limitations associated with providing "not-in-person" medical evaluation and management services. I also informed Ms. Hubbard of the availability of "in-person" appointments. Finally, I informed her that there would be a charge for the virtual visit and that she could be  personally, fully or partially, financially responsible for it. Ms. Picado expressed understanding and agreed to proceed.   Historic Elements   Ms. ASHLYNN GUNNELS is a 58 y.o. year old, female patient evaluated today after her last encounter by our practice on 09/09/2018. Ms. 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), Anemia, Anxiety, Asthma, Cephalalgia (08/18/2014), Cervical disc disease  (08/18/2014), Chronic headaches, DDD (degenerative disc disease), Depression, Dissociative disorder, Dizziness (04/22/2013), Duodenal ulcer with hemorrhage and perforation ([place]) (04/27/2003), GERD (gastroesophageal reflux disease), H/O arthrodesis (08/18/2014), Headache(784.0), History of blood transfusion, History of cervical spinal surgery (01/04/2015), History of kidney stones, Hypertension, Inverted T wave, Narrowing of intervertebral disc space (08/18/2014), Orthostatic hypotension (04/22/2013), Pain, Pneumonia, PONV (postoperative nausea and vomiting), Postop Hyponatremia (05/14/2012), Postoperative anemia due to acute blood loss (05/14/2012), PTSD (post-traumatic stress disorder), Right foot drop, and Right hip arthralgia (08/18/2014). She also  has a past surgical history that includes RIGHT HIP ARTHROSCOPY FOR LABRAL TEAR (ABOUT 2010); Anterior fusion cervical spine (MAY 2012); Nasal septum surgery (MARCH 2013); Back surgery (2009); Tonsillectomy; Abdominal hysterectomy; DIAGNOSTIC LAPAROSCOPIES - MULTIPLE FOR ENDOMETRIOSIS; Cholecystectomy; Hip arthroscopy (09/20/2011); Shoulder arthroscopy (05-06-12); Carpal tunnel release (05-06-12); Total hip arthroplasty (Right, 05/13/2012); Anterior cervical decomp/discectomy fusion (N/A, 10/30/2012); Posterior cervical fusion/foraminotomy (N/A, 04/16/2013); Breast biopsy (Right, 2008); Breast excisional biopsy (Left, 1998); Breast reduction surgery (Bilateral, 06/2016); Colonoscopy with propofol (N/A, 01/26/2017); Esophagogastroduodenoscopy (egd) with propofol (N/A, 01/26/2017); Esophageal dilation (N/A, 01/26/2017); polypectomy (N/A, 01/26/2017); PH impedance study (N/A, 05/30/2017); and Esophageal manometry (N/A, 05/30/2017). Ms. Trulson has a current medication list which includes the following prescription(s): albuterol, alprazolam, bupropion, dexlansoprazole, docusate sodium, duloxetine, fluticasone-salmeterol, magnesium, metoprolol succinate, montelukast, ondansetron, oxycodone,  oxycodone, oxycodone, aerochamber mv, valacyclovir, calcium carbonate, pregabalin, and pregabalin. She  reports that she has never smoked. She has never used smokeless tobacco. She reports that she does not drink alcohol or use drugs. Ms. Espinoza is allergic to amoxicillin; chlorhexidine gluconate; clindamycin; codeine; erythromycin; penicillin g; sulfa antibiotics; levofloxacin; shellfish allergy; decadron [dexamethasone]; mangifera indica; papaya derivatives; betadine [povidone iodine]; chlorhexidine; clarithromycin; clindamycin/lincomycin; other; povidone-iodine; and prednisone.   HPI  Today, she is being contacted  for medication management.  Today we have assessed the patient is a Lyrica trial.  The patient was pending left hip THR (by Dr. Maureen Ralphs).  Pharmacotherapy Assessment  Analgesic: Oxycodone IR 5 mg, 1 tab PO q 6 hrs (20 mg/day of oxycodone) (has enough until 11/27/2018) MME/day: 30 mg/day.   Monitoring: Pharmacotherapy: No side-effects or adverse reactions reported. [place] PMP: PDMP reviewed during this encounter.       Compliance: No problems identified. Effectiveness: Clinically acceptable. Plan: Refer to "POC".  Pertinent Labs   SAFETY SCREENING Profile Lab Results  Component Value Date   STAPHAUREUS NEGATIVE 04/11/2013   MRSAPCR NEGATIVE 04/11/2013   HIV NON-REACTIVE 12/07/2016   Renal Function Lab Results  Component Value Date   BUN 8 12/25/2017   CREATININE 0.79 12/25/2017   BCR 10 12/25/2017   GFRAA 95 12/25/2017   GFRNONAA 83 12/25/2017   Hepatic Function Lab Results  Component Value Date   AST 13 12/25/2017   ALT 9 12/25/2017   ALBUMIN 4.3 12/25/2017   UDS Summary  Date Value Ref Range Status  05/30/2018 FINAL  Final    Comment:    ==================================================================== TOXASSURE SELECT 13 (MW) ==================================================================== Test                             Result       Flag        Units Drug Present and Declared for Prescription Verification   Oxycodone                      559          EXPECTED   ng/mg creat   Noroxycodone                   4595         EXPECTED   ng/mg creat    Sources of oxycodone include scheduled prescription medications.    Noroxycodone is an expected metabolite of oxycodone. Drug Present not Declared for Prescription Verification   Alprazolam                     205          UNEXPECTED ng/mg creat   Alpha-hydroxyalprazolam        127          UNEXPECTED ng/mg creat    Source of alprazolam is a scheduled prescription medication.    Alpha-hydroxyalprazolam is an expected metabolite of alprazolam. ==================================================================== Test                      Result    Flag   Units      Ref Range   Creatinine              22               mg/dL      >=20 ==================================================================== Declared Medications:  The flagging and interpretation on this report are based on the  following declared medications.  Unexpected results may arise from  inaccuracies in the declared medications.  **Note: The testing scope of this panel includes these medications:  Oxycodone  **Note: The testing scope of this panel does not include following  reported medications:  Bupropion (Wellbutrin)  Dexlansoprazole (Dexilant)  Duloxetine (Cymbalta)  Fluticasone (Advair)  Magnesium  Metoprolol  Montelukast  Ondansetron  Salmeterol (Advair)  Supplement (OsCal)  Vitamin D2  Vitamin D3 ==================================================================== For clinical consultation, please call [phone]. ====================================================================    Note: Above Lab results reviewed.  Recent imaging  CT LUMBAR SPINE WO CONTRAST CLINICAL DATA:  Chronic low back and bilateral hip pain. History of prior thoracolumbar surgery.  EXAM: CT LUMBAR SPINE WITHOUT  CONTRAST  TECHNIQUE: Multidetector CT imaging of the lumbar spine was performed without intravenous contrast administration. Multiplanar CT image reconstructions were also generated.  COMPARISON:  CT abdomen and pelvis 08/28/2016.  FINDINGS: Segmentation: Standard.  Alignment: Normal.  Vertebrae: Imaging from the inferior endplate of G40 through S3 is provided and demonstrates postoperative change of T12-L4 fusion. Note is made that fusion extends to the T10 on the prior CT. Hardware is intact without loosening. Solid posterior fusion mass is seen at all imaged levels. Solid bridging bone is also seen across the disc interspaces at L2-3 and L3-4 where interbody spaces are in place. Pedicle screws are appropriately positioned. Defect in the posterior right ilium for bone graft harvest is noted. No pars interarticularis defect is identified. Mineralization is normal.  Paraspinal and other soft tissues: Negative.  Disc levels: T11-12: Widely patent central canal and foramina. No disc bulge or protrusion.  T12-L1: Widely patent central canal and foramina. No bulge or protrusion.  L1-2: Widely patent central canal and foramina. No bulge or protrusion.  L2-3: Status post discectomy and fusion. The central canal and foramina are widely patent.  L3-4: Status post discectomy and fusion. The central canal and foramina are widely patent.  L4-5: Moderate facet degenerative disease, shallow disc bulge and ligamentum flavum thickening. The central canal is widely patent at 13 mm. The foramina are open. The foramina are also widely patent.  L5-S1: Minimal disc bulge and ligamentum flavum thickening without central canal or foraminal stenosis. The central canal measures 14 mm AP.  IMPRESSION: Status post fusion from T10-L4. T10 and T11 are not included on this examination. Hardware is intact and appropriately positioned from T12-L4 with a solid posterior fusion mass at all levels  and solid interbody fusion at L2-3 and L3-4 where interbody spacers are in place.  Widely patent central canal and foramina at all levels.  Mild to moderate facet degenerative disease L4-5 and L5-S1 is more notable L4-5.  Electronically Signed   By: Inge Rise M.D.   On: 05/24/2018 14:59  Assessment  The primary encounter diagnosis was Chronic pain syndrome. Diagnoses of Chronic low back pain (Primary area of Pain) (Bilateral) (L>R) w/o sciatica, Chronic hip pain (Secondary Area of Pain) (Bilateral) (L>R), Chronic shoulder pain (Tertiary Area of Pain)  (Bilateral) (L>R), Pharmacologic therapy, Disorder of skeletal system, Problems influencing health status, Neuropathic pain, Neurogenic pain, and Lumbar facet syndrome (Bilateral) (L>R) were also pertinent to this visit.  Plan of Care  I have changed Mayar Whittier. Blakeley's pregabalin. I am also having her start on pregabalin. Additionally, I am having her maintain her AeroChamber MV, albuterol, Docusate Sodium (COLACE PO), DULoxetine, Fluticasone-Salmeterol, valACYclovir, Magnesium, calcium carbonate, ALPRAZolam, metoprolol succinate, montelukast, ondansetron, oxyCODONE, oxyCODONE, oxyCODONE, dexlansoprazole, and buPROPion.  Pharmacotherapy (Medications Ordered): Meds ordered this encounter  Medications  . pregabalin (LYRICA) 50 MG capsule    Sig: Take 1 capsule (50 mg total) by mouth 2 (two) times daily for 7 days, THEN 1 capsule (50 mg total) 3 (three) times daily for 7 days.    Dispense:  35 capsule    Refill:  0    Fill one day early if pharmacy is closed on scheduled refill date. May substitute for  generic if available.  . pregabalin (LYRICA) 75 MG capsule    Sig: Take 1 capsule (75 mg total) by mouth 2 (two) times daily for 7 days, THEN 1 capsule (75 mg total) 3 (three) times daily for 7 days.    Dispense:  35 capsule    Refill:  0    Fill one day early if pharmacy is closed on scheduled refill date. May substitute for generic  if available.   Orders:  Orders Placed This Encounter  Procedures  . Radiofrequency,Lumbar    Standing Status:   Future    Standing Expiration Date:   04/01/2020    Scheduling Instructions:     Side(s): Right-sided     Level: L3-4, L4-5, & L5-S1 Facets (L2, L3, L4, L5, & S1 Medial Branch Nerves)     Sedation: With Sedation     Scheduling Timeframe: As soon as pre-approved    Order Specific Question:   Where will this procedure be performed?    Answer:   [facility]  . ToxASSURE Select 13 (MW), Urine    Volume: 30 ml(s). Minimum 3 ml of urine is needed. Document temperature of fresh sample. Indications: Long term (current) use of opiate analgesic (Z79.891)  . Comp. Metabolic Panel (12)    With GFR. Indications: Chronic Pain Syndrome (G89.4) & Pharmacotherapy (H06.237)    Order Specific Question:   Has the patient fasted?    Answer:   No    Order Specific Question:   CC Results    Answer:   PCP-NURSE [[id number]]  . Magnesium    Indication: Pharmacologic therapy (V76.160)    Order Specific Question:   CC Results    Answer:   PCP-NURSE [[id number]]  . Vitamin B12    Indication: Pharmacologic therapy (Y69.485).    Order Specific Question:   CC Results    Answer:   PCP-NURSE [[id number]]  . Sedimentation rate    Indication: Disorder of skeletal system (M89.9)    Order Specific Question:   CC Results    Answer:   PCP-NURSE [[id number]]  . 25-Hydroxyvitamin D Lcms D2+D3    Indication: Disorder of skeletal system (M89.9).    Order Specific Question:   CC Results    Answer:   PCP-NURSE [[id number]]  . C-reactive protein    Indication: Problems influencing health status (Z78.9)    Order Specific Question:   CC Results    Answer:   PCP-NURSE [[id number]]   Follow-up plan:   Return in about 8 weeks (around 11/27/2018) for (VV), E/M (MM), in addition, RFA: (R) L-FCT RFA #1 (w/ sedation), (ASAP).      Interventional management options: Considering:   NOTE: SHELLFISH ALLERGY; Dexamethasone &  Prednisone allergy Diagnostic bilateral lumbar facet block #1  Palliativebilateral suprascapular nerve block(without steroids) #3 Possibleright-sidedsuprascapular nerve RFA #1   Palliative PRN treatment(s):   Palliativebilateral suprascapular nerve block Palliative left-sidedsuprascapular nerve RFA #2(last done on 01/04/2017)    Recent Visits Date Type Provider Dept  07/31/18 Office Visit Milinda Pointer, MD [facility]  Showing recent visits within past 90 days and meeting all other requirements   Today's Visits Date Type Provider Dept  09/30/18 Office Visit Milinda Pointer, MD [facility]  Showing today's visits and meeting all other requirements   Future Appointments No visits were found meeting these conditions.  Showing future appointments within next 90 days and meeting all other requirements   I discussed the assessment and treatment plan with the patient. The patient was provided  an opportunity to ask questions and all were answered. The patient agreed with the plan and demonstrated an understanding of the instructions.  Patient advised to call back or seek an in-person evaluation if the symptoms or condition worsens.  Total duration of non-face-to-face encounter: 15 minutes.  Note by: Gaspar Cola, MD Date: 09/30/2018; Time: 1:18 PM  Note: This dictation was prepared with Dragon dictation. Any transcriptional errors that may result from this process are unintentional.  Disclaimer:  * Given the special circumstances of the COVID-19 pandemic, the federal government has announced that the Office for Civil Rights (OCR) will exercise its enforcement discretion and will not impose penalties on physicians using telehealth in the event of noncompliance with regulatory requirements under the [place] (HIPAA) in connection with the good faith provision of telehealth during the KGYJE-56 national  public health emergency. ([place])

## 2018-09-30 ENCOUNTER — Other Ambulatory Visit: Payer: Self-pay

## 2018-09-30 ENCOUNTER — Ambulatory Visit: Payer: Medicare Other | Attending: Pain Medicine | Admitting: Pain Medicine

## 2018-09-30 DIAGNOSIS — Z79899 Other long term (current) drug therapy: Secondary | ICD-10-CM | POA: Diagnosis not present

## 2018-09-30 DIAGNOSIS — M25551 Pain in right hip: Secondary | ICD-10-CM

## 2018-09-30 DIAGNOSIS — M545 Low back pain, unspecified: Secondary | ICD-10-CM

## 2018-09-30 DIAGNOSIS — M899 Disorder of bone, unspecified: Secondary | ICD-10-CM

## 2018-09-30 DIAGNOSIS — Z789 Other specified health status: Secondary | ICD-10-CM

## 2018-09-30 DIAGNOSIS — M47816 Spondylosis without myelopathy or radiculopathy, lumbar region: Secondary | ICD-10-CM

## 2018-09-30 DIAGNOSIS — M25552 Pain in left hip: Secondary | ICD-10-CM

## 2018-09-30 DIAGNOSIS — M25511 Pain in right shoulder: Secondary | ICD-10-CM | POA: Diagnosis not present

## 2018-09-30 DIAGNOSIS — G894 Chronic pain syndrome: Secondary | ICD-10-CM

## 2018-09-30 DIAGNOSIS — G8929 Other chronic pain: Secondary | ICD-10-CM

## 2018-09-30 DIAGNOSIS — M25512 Pain in left shoulder: Secondary | ICD-10-CM

## 2018-09-30 DIAGNOSIS — M792 Neuralgia and neuritis, unspecified: Secondary | ICD-10-CM

## 2018-09-30 MED ORDER — PREGABALIN 50 MG PO CAPS: ORAL_CAPSULE | ORAL | 0 refills | Status: DC

## 2018-09-30 MED ORDER — PREGABALIN 75 MG PO CAPS: ORAL_CAPSULE | ORAL | 0 refills | Status: DC

## 2018-09-30 NOTE — Patient Instructions (Signed)

## 2018-09-30 NOTE — Addendum Note (Signed)
Addended by: Milinda Pointer A on: 09/30/2018 01:18 PM   Modules accepted: Orders, SmartSet

## 2018-10-01 ENCOUNTER — Telehealth: Payer: Self-pay | Admitting: Pain Medicine

## 2018-10-01 NOTE — Telephone Encounter (Signed)
Pharmacy questions answered.

## 2018-10-01 NOTE — Telephone Encounter (Signed)
Pharmacy called stating they need to verify scripts for Lyrica 2 different strength.

## 2018-10-03 ENCOUNTER — Ambulatory Visit: Payer: Medicare Other | Admitting: Gastroenterology

## 2018-10-07 ENCOUNTER — Other Ambulatory Visit: Payer: Self-pay | Admitting: Pain Medicine

## 2018-10-07 DIAGNOSIS — E559 Vitamin D deficiency, unspecified: Secondary | ICD-10-CM

## 2018-10-15 ENCOUNTER — Other Ambulatory Visit: Payer: Self-pay | Admitting: Family Medicine

## 2018-10-15 DIAGNOSIS — Z1231 Encounter for screening mammogram for malignant neoplasm of breast: Secondary | ICD-10-CM

## 2018-10-23 ENCOUNTER — Inpatient Hospital Stay: Admission: RE | Admit: 2018-10-23 | Payer: Medicare Other | Source: Ambulatory Visit

## 2018-10-30 ENCOUNTER — Encounter: Payer: Self-pay | Admitting: *Deleted

## 2018-10-30 ENCOUNTER — Ambulatory Visit: Payer: Medicare Other | Admitting: Gastroenterology

## 2018-11-11 DIAGNOSIS — M899 Disorder of bone, unspecified: Secondary | ICD-10-CM | POA: Diagnosis not present

## 2018-11-11 DIAGNOSIS — Z79899 Other long term (current) drug therapy: Secondary | ICD-10-CM | POA: Diagnosis not present

## 2018-11-11 DIAGNOSIS — G894 Chronic pain syndrome: Secondary | ICD-10-CM | POA: Diagnosis not present

## 2018-11-11 DIAGNOSIS — Z789 Other specified health status: Secondary | ICD-10-CM | POA: Diagnosis not present

## 2018-11-12 ENCOUNTER — Ambulatory Visit: Payer: Medicare Other | Attending: Pain Medicine | Admitting: Pain Medicine

## 2018-11-12 ENCOUNTER — Encounter: Payer: Self-pay | Admitting: Pain Medicine

## 2018-11-12 ENCOUNTER — Telehealth: Payer: Self-pay | Admitting: *Deleted

## 2018-11-12 ENCOUNTER — Other Ambulatory Visit: Payer: Self-pay

## 2018-11-12 DIAGNOSIS — Z91041 Radiographic dye allergy status: Secondary | ICD-10-CM | POA: Insufficient documentation

## 2018-11-12 DIAGNOSIS — M47816 Spondylosis without myelopathy or radiculopathy, lumbar region: Secondary | ICD-10-CM | POA: Insufficient documentation

## 2018-11-12 DIAGNOSIS — Z91013 Allergy to seafood: Secondary | ICD-10-CM

## 2018-11-12 DIAGNOSIS — M792 Neuralgia and neuritis, unspecified: Secondary | ICD-10-CM | POA: Diagnosis not present

## 2018-11-12 DIAGNOSIS — M961 Postlaminectomy syndrome, not elsewhere classified: Secondary | ICD-10-CM | POA: Insufficient documentation

## 2018-11-12 DIAGNOSIS — M47817 Spondylosis without myelopathy or radiculopathy, lumbosacral region: Secondary | ICD-10-CM

## 2018-11-12 DIAGNOSIS — G894 Chronic pain syndrome: Secondary | ICD-10-CM | POA: Insufficient documentation

## 2018-11-12 DIAGNOSIS — M545 Low back pain, unspecified: Secondary | ICD-10-CM

## 2018-11-12 DIAGNOSIS — M5137 Other intervertebral disc degeneration, lumbosacral region: Secondary | ICD-10-CM | POA: Insufficient documentation

## 2018-11-12 DIAGNOSIS — G8929 Other chronic pain: Secondary | ICD-10-CM | POA: Diagnosis not present

## 2018-11-12 MED ORDER — OXYCODONE HCL 5 MG PO TABS: 5.0000 mg | ORAL_TABLET | Freq: Four times a day (QID) | ORAL | 0 refills | Status: DC | PRN

## 2018-11-12 MED ORDER — PREGABALIN 100 MG PO CAPS: ORAL_CAPSULE | ORAL | 0 refills | Status: DC

## 2018-11-12 NOTE — Progress Notes (Signed)
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 & Extremity Inspection: Skin color, temperature, and hair growth are WNL. No peripheral edema or cyanosis. No masses, redness, swelling, asymmetry, or associated skin lesions. No contractures.  Skin & Extremity Inspection: Skin color, temperature, and hair growth are WNL. No peripheral edema or cyanosis. No masses, redness, swelling, asymmetry, or associated skin lesions. No contractures.  Functional ROM: Unrestricted ROM                  Functional ROM: Unrestricted ROM                  Muscle Tone/Strength: Functionally intact. No obvious neuro-muscular anomalies detected.  Muscle Tone/Strength: Functionally intact. No obvious neuro-muscular anomalies detected.  Sensory (Neurological): Unimpaired        Sensory (Neurological): Unimpaired        DTR: Patellar: deferred today Achilles: deferred today Plantar: deferred today  DTR: Patellar:  deferred today Achilles: deferred today Plantar: deferred today  Palpation: No palpable anomalies  Palpation: No palpable anomalies   Assessment   Status Diagnosis  Controlled Controlled Controlled 1. Lumbar facet syndrome (Bilateral) (L>R)   2. Spondylosis without myelopathy or radiculopathy, lumbosacral region   3. DDD (degenerative disc disease), lumbosacral   4. Chronic low back pain (Primary area of Pain) (Bilateral) (L>R) w/o sciatica   5. 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 History of allergy to radiographic contrast media   8. History of allergy to shellfish   9. Neuropathic pain   10. Neurogenic pain   11. Chronic pain syndrome      Updated Problems: Problem  Spondylosis Without Myelopathy Or Radiculopathy, Lumbosacral Region  Other Intervertebral Disc Degeneration, Lumbar Region  History of Allergy to Radiographic Contrast Media  History of Allergy to Shellfish    Plan of Care  Pharmacotherapy (Medications Ordered): Meds ordered this encounter  Medications  . pregabalin (LYRICA) 100 MG capsule    Sig: Take 1 capsule (100 mg total) by mouth 2 (two) times daily for 7 days, THEN 1 capsule (100 mg total) 3 (three) times daily.    Dispense:  263 capsule    Refill:  0    Fill one day early if pharmacy is closed on scheduled refill date. May substitute for generic if available.  Marland Kitchen oxyCODONE (OXY IR/ROXICODONE) 5 MG immediate release tablet    Sig: Take 1 tablet (5 mg total) by mouth every 6 (six) hours as needed for severe pain. Must last 30 days    Dispense:  120 tablet    Refill:  0    Chronic Pain: STOP Act (Not applicable) Fill 1 day early if closed on refill date. Do not fill until: 11/27/2018. To last until: 12/27/2018. Avoid benzodiazepines within 8 hours of opioids  .  oxyCODONE (OXY IR/ROXICODONE) 5 MG immediate release tablet    Sig: Take 1 tablet (5 mg total) by mouth every 6 (six) hours as needed for severe  pain. Must last 30 days    Dispense:  120 tablet    Refill:  0    Chronic Pain: STOP Act (Not applicable) Fill 1 day early if closed on refill date. Do not fill until: 12/27/2018. To last until: 01/26/2019. Avoid benzodiazepines within 8 hours of opioids  . oxyCODONE (OXY IR/ROXICODONE) 5 MG immediate release tablet    Sig: Take 1 tablet (5 mg total) by mouth every 6 (six) hours as needed for severe pain. Must last 30 days    Dispense:  120 tablet    Refill:  0    Chronic Pain: STOP Act (Not applicable) Fill 1 day early if closed on refill date. Do not fill until: 01/26/2019. To last until: 02/25/2019. Avoid benzodiazepines within 8 hours of opioids   Medications administered today: Sindy Messing. [name] had no medications administered during this visit.  Orders:  Orders Placed This Encounter  Procedures  . LUMBAR FACET(MEDIAL BRANCH NERVE BLOCK) MBNB    Standing Status:   Future    Standing Expiration Date:   12/12/2018    Scheduling Instructions:     Side: Bilateral     Level: L3-4, L4-5, & L5-S1 Facets (L2, L3, L4, L5, & S1 Medial Branch Nerves)     Sedation: Patient's choice.     Timeframe: ASAA    Order Specific Question:   Where will this procedure be performed?    Answer:   [facility]   Lab Orders  No laboratory test(s) ordered today   Imaging Orders  No imaging studies ordered today   Referral Orders  No referral(s) requested today   Planned follow-up:   Return for Procedure (w/ Sedation): (B) L-FCT BLK #1, (ASAP).      Interventional management options: Considering:   NOTE: SHELLFISH ALLERGY; Dexamethasone & Prednisone allergy Diagnostic bilateral lumbar facet block #1  Palliativebilateral suprascapular nerve block(without steroids) #3 Possiblerightsuprascapular nerve RFA #1   Palliative PRN treatment(s):   Palliativebilateral suprascapular nerve block Palliative leftsuprascapular nerve RFA #2(last done on 01/04/2017)     Recent Visits Date  Type Provider Dept  09/30/18 Office Visit Milinda Pointer, MD [facility]  Showing recent visits within past 90 days and meeting all other requirements   Today's Visits Date Type Provider Dept  11/12/18 Procedure visit Milinda Pointer, MD [facility]  Showing today's visits and meeting all other requirements   Future Appointments Date Type Provider Dept  11/26/18 Appointment Milinda Pointer, MD [facility]  Showing future appointments within next 90 days and meeting all other requirements   Primary Care Physician: Virginia Crews, MD Location: [facility] Note by: Gaspar Cola, MD Date: 11/12/2018; Time: 11:52 AM  Note: This dictation was prepared with Dragon dictation. Any transcriptional errors that may result from this process are unintentional.

## 2018-11-12 NOTE — Patient Instructions (Addendum)
____________________________________________________________________________________________  Preparing for Procedure with Sedation  Procedure appointments are limited to planned procedures: . No Prescription Refills. . No disability issues will be discussed. . No medication changes will be discussed.  Instructions: . Oral Intake: Do not eat or drink anything for at least 8 hours prior to your procedure. . Transportation: Public transportation is not allowed. Bring an adult driver. The driver must be physically present in our waiting room before any procedure can be started. Marland Kitchen Physical Assistance: Bring an adult physically capable of assisting you, in the event you need help. This adult should keep you company at home for at least 6 hours after the procedure. . Blood Pressure Medicine: Take your blood pressure medicine with a sip of water the morning of the procedure. . Blood thinners: Notify our staff if you are taking any blood thinners. Depending on which one you take, there will be specific instructions on how and when to stop it. . Diabetics on insulin: Notify the staff so that you can be scheduled 1st case in the morning. If your diabetes requires high dose insulin, take only  of your normal insulin dose the morning of the procedure and notify the staff that you have done so. . Preventing infections: Shower with an antibacterial soap the morning of your procedure. . Build-up your immune system: Take 1000 mg of Vitamin C with every meal (3 times a day) the day prior to your procedure. Marland Kitchen Antibiotics: Inform the staff if you have a condition or reason that requires you to take antibiotics before dental procedures. . Pregnancy: If you are pregnant, call and cancel the procedure. . Sickness: If you have a cold, fever, or any active infections, call and cancel the procedure. . Arrival: You must be in the facility at least 30 minutes prior to your scheduled procedure. . Children: Do not bring  children with you. . Dress appropriately: Bring dark clothing that you would not mind if they get stained. . Valuables: Do not bring any jewelry or valuables.  Reasons to call and reschedule or cancel your procedure: (Following these recommendations will minimize the risk of a serious complication.) . Surgeries: Avoid having procedures within 2 weeks of any surgery. (Avoid for 2 weeks before or after any surgery). . Flu Shots: Avoid having procedures within 2 weeks of a flu shots or . (Avoid for 2 weeks before or after immunizations). . Barium: Avoid having a procedure within 7-10 days after having had a radiological study involving the use of radiological contrast. (Myelograms, Barium swallow or enema study). . Heart attacks: Avoid any elective procedures or surgeries for the initial 6 months after a "Myocardial Infarction" (Heart Attack). . Blood thinners: It is imperative that you stop these medications before procedures. Let us know if you if you take any blood thinner.  . Infection: Avoid procedures during or within two weeks of an infection (including chest colds or gastrointestinal problems). Symptoms associated with infections include: Localized redness, fever, chills, night sweats or profuse sweating, burning sensation when voiding, cough, congestion, stuffiness, runny nose, sore throat, diarrhea, nausea, vomiting, cold or Flu symptoms, recent or current infections. It is specially important if the infection is over the area that we intend to treat. Marland Kitchen Heart and lung problems: Symptoms that may suggest an active cardiopulmonary problem include: cough, chest pain, breathing difficulties or shortness of breath, dizziness, ankle swelling, uncontrolled high or unusually low blood pressure, and/or palpitations. If you are experiencing any of these symptoms, cancel your procedure and contact  your primary care physician for an evaluation.  Remember:  Regular Business hours are:  Monday to Thursday  8:00 AM to 4:00 PM  Provider's Schedule: Delano Metz, MD:  Procedure days: Tuesday and Thursday 7:30 AM to 4:00 PM  Edward Jolly, MD:  Procedure days: Monday and Wednesday 7:30 AM to 4:00 PM ____________________________________________________________________________________________  Drug Holidays  Definitions Tolerance:  Defined as the progressively decreased responsiveness to a drug.  Occurs when the drug is used repeatedly and the body adapts to the continued presence of the drug.  As a result, a larger dose of the drug is needed to achieve the effect originally obtained by a smaller dose.  It is thought to be due to the formation of excess opioid receptors.  Drug Holiday:   Is when a patient stops taking a medication(s) for a period of time; anywhere from a few days to several weeks.  Withdrawals:   Refers to the wide range of symptoms that occur after stopping or dramatically reducing opiate drugs after heavy or prolonged use.  Withdrawal symptoms do not occur to patients that use low dose opioids, or those who take the medication sporadically.  Contrary to benzodiazepine (example: Valium, Xanax, etc.) or alcohol withdrawals ("Delirium Tremens"), opioid withdrawals are not lethal.  Withdrawals are the physical manifestation of the body getting rid of the excess receptors.  Purpose To eliminate tolerance.  Duration of Holiday 14 consecutive days. (2 weeks)  Expected Symptoms Early symptoms of withdrawal include:   Agitation  Anxiety  Muscle Aches   Increased tearing  Insomnia  Runny Nose   Sweating  Yawning     Late symptoms of withdrawal include:   Abdominal cramping  Diarrhea  Dilated pupils   Goose bumps  Nausea  Vomiting   Opioid withdrawal reactions are very uncomfortable but are not life-threatening.  Symptoms usually start within 12 hours of last opioid dose and within 30 hours of last methadone exposure.  Duration of Symptoms 48-72 hours of  short acting medications and 2 to 4 days for methadone.  Treatment  Clonidine (CatepresT) or tizanidine (ZanaflexT) for agitation, sweating, tearing, runny nose.  Promethazine (PhenerganT) for nausea, vomiting.  NSAIDs for pain.  Benefits 1. Improved effectiveness of opioids. 2. Decreased opioid dose needed to achieve benefits. 3. Improved pain with lesser dose. Facet Joint Block The facet joints connect the bones of the spine (vertebrae). They make it possible for you to bend, twist, and make other movements with your spine. They also keep you from bending too far, twisting too far, and making other extreme movements. A facet joint block is a procedure in which a numbing medicine (anesthetic) is injected into a facet joint. In many cases, an anti-inflammatory medicine (steroid) is also injected. A facet joint block may be done:  To diagnose neck or back pain. If the pain gets better after a facet joint block, it means the pain is probably coming from the facet joint. If the pain does not get better, it means the pain is probably not coming from the facet joint.  To relieve neck or back pain that is caused by an inflamed facet joint. A facet joint block is only done to relieve pain if the pain does not improve with other methods, such as medicine, exercise programs, and physical therapy. Tell a health care provider about:  Any allergies you have.  All medicines you are taking, including vitamins, herbs, eye drops, creams, and over-the-counter medicines.  Any problems you or family members have  had with anesthetic medicines.  Any blood disorders you have.  Any surgeries you have had.  Any medical conditions you have or have had.  Whether you are pregnant or may be pregnant. What are the risks? Generally, this is a safe procedure. However, problems may occur, including:  Bleeding.  Injury to a nerve near the injection site.  Pain at the injection site.  Weakness or  numbness in areas controlled by nerves near the injection site.  Infection.  Temporary fluid retention.  Allergic reactions to medicines or dyes.  Injury to other structures or organs near the injection site. What happens before the procedure? Medicines Ask your health care provider about:  Changing or stopping your regular medicines. This is especially important if you are taking diabetes medicines or blood thinners.  Taking medicines such as aspirin and ibuprofen. These medicines can thin your blood. Do not take these medicines unless your health care provider tells you to take them.  Taking over-the-counter medicines, vitamins, herbs, and supplements. Eating and drinking Follow instructions from your health care provider about eating and drinking, which may include:  8 hours before the procedure - stop eating heavy meals or foods, such as meat, fried foods, or fatty foods.  6 hours before the procedure - stop eating light meals or foods, such as toast or cereal.  6 hours before the procedure - stop drinking milk or drinks that contain milk.  2 hours before the procedure - stop drinking clear liquids. Staying hydrated Follow instructions from your health care provider about hydration, which may include:  Up to 2 hours before the procedure - you may continue to drink clear liquids, such as water, clear fruit juice, black coffee, and plain tea. General instructions  Do not use any products that contain nicotine or tobacco for at least 4-6 weeks before the procedure. These products include cigarettes, e-cigarettes, and chewing tobacco. If you need help quitting, ask your health care provider.  Plan to have someone take you home from the hospital or clinic.  Ask your health care provider: ? How your surgery site will be marked. ? What steps will be taken to help prevent infection. These may include:  Removing hair at the surgery site.  Washing skin with a germ-killing  soap.  Receiving antibiotic medicine. What happens during the procedure?   You will put on a hospital gown.  You will lie on your stomach on an X-ray table. You may be asked to lie in a different position if an injection will be made in your neck.  Machines will be used to monitor your oxygen levels, heart rate, and blood pressure.  Your skin will be cleaned.  If an injection will be made in your neck, an IV will be inserted into one of your veins. Fluids and medicine will flow directly into your body through the IV.  A numbing medicine (local anesthetic) will be applied to your skin. Your skin may sting or burn for a moment.  A video X-ray machine (fluoroscopy) will be used to find the joint. In some cases, a CT scan may be used.  A contrast dye may be injected into the facet joint area to help find the joint.  When the joint is located, an anesthetic will be injected into the joint through the needle.  Your health care provider will ask you whether you feel pain relief. ? If you feel relief, a steroid may be injected to provide pain relief for a longer period of  time. ? If you do not feel relief or feel only partial relief, additional injections of an anesthetic may be made in other facet joints.  The needle will be removed.  Your skin will be cleaned.  A bandage (dressing) will be applied over each injection site. The procedure may vary among health care providers and hospitals. What happens after the procedure?  Your blood pressure, heart rate, breathing rate, and blood oxygen level will be monitored until you leave the hospital or clinic.  You will lie down and rest for a period of time. Summary  A facet joint block is a procedure in which a numbing medicine (anesthetic) is injected into a facet joint. An anti-inflammatory medicine (stereoid) may also be injected.  Follow instructions from your health care provider about medicines and eating and drinking before the  procedure.  Do not use any products that contain nicotine or tobacco for at least 4-6 weeks before the procedure.  You will lie on your stomach for the procedure, but you may be asked to lie in a different position if an injection will be made in your neck.  When the joint is located, an anesthetic will be injected into the joint through the needle. This information is not intended to replace advice given to you by your health care provider. Make sure you discuss any questions you have with your health care provider. Document Released: 07/19/2006 Document Revised: 06/20/2018 Document Reviewed: 02/01/2018 Elsevier Patient Education  2020 [employer].

## 2018-11-12 NOTE — Telephone Encounter (Signed)
Dr Dossie Arbour asked me to call patient to let her know that todays visit will be for diagnostic testing and not the lumbar facet RFA.  Upon review he noted that she has not had the lumbar facet with positive results.  I did call and let her know.  She can't use steroids due to an allergy.  She asked if Dr Dossie Arbour would consider increasing her pain medicine, told her that we do not discuss medication management on procedure days.  Told her that she would f/up in approx 2 weeks, she states she doesn't think she can hold on for 2 more weeks that she has been waiting since March for this procedure.  I did tell her that since we are not using steroid we may be able to see her back sooner as we will not allowing 14 days for steroid.  Told her we would discuss this today when she arrives.

## 2018-11-12 NOTE — Progress Notes (Signed)
Safety precautions to be maintained throughout the outpatient stay will include: orient to surroundings, keep bed in low position, maintain call bell within reach at all times, provide assistance with transfer out of bed and ambulation.   Patient ate an hour before arriving to the Pain Clinic, was unable to do procedure. Dr Lowella Dandy spent 30+ min talking with patient  about  Procedures and things that can help benefit the patient.

## 2018-11-14 LAB — TOXASSURE SELECT 13 (MW), URINE

## 2018-11-15 LAB — 25-HYDROXY VITAMIN D LCMS D2+D3
25-Hydroxy, Vitamin D-2: 9.8 ng/mL
25-Hydroxy, Vitamin D-3: 13 ng/mL
25-Hydroxy, Vitamin D: 23 ng/mL — ABNORMAL LOW

## 2018-11-15 LAB — COMP. METABOLIC PANEL (12)
AST: 28 IU/L (ref 0–40)
Albumin/Globulin Ratio: 1.5 (ref 1.2–2.2)
Albumin: 4 g/dL (ref 3.8–4.9)
Alkaline Phosphatase: 134 IU/L — ABNORMAL HIGH (ref 39–117)
BUN/Creatinine Ratio: 7 — ABNORMAL LOW (ref 9–23)
BUN: 6 mg/dL (ref 6–24)
Bilirubin Total: 0.4 mg/dL (ref 0.0–1.2)
Calcium: 9.2 mg/dL (ref 8.7–10.2)
Chloride: 103 mmol/L (ref 96–106)
Creatinine, Ser: 0.9 mg/dL (ref 0.57–1.00)
GFR calc Af Amer: 81 mL/min/{1.73_m2} (ref >59)
GFR calc non Af Amer: 70 mL/min/{1.73_m2} (ref >59)
Globulin, Total: 2.7 g/dL (ref 1.5–4.5)
Glucose: 122 mg/dL — ABNORMAL HIGH (ref 65–99)
Potassium: 4 mmol/L (ref 3.5–5.2)
Sodium: 138 mmol/L (ref 134–144)
Total Protein: 6.7 g/dL (ref 6.0–8.5)

## 2018-11-15 LAB — MAGNESIUM: Magnesium: 1.9 mg/dL (ref 1.6–2.3)

## 2018-11-15 LAB — C-REACTIVE PROTEIN: CRP: 12 mg/L — ABNORMAL HIGH (ref 0–10)

## 2018-11-15 LAB — VITAMIN B12: Vitamin B-12: 427 pg/mL (ref 232–1245)

## 2018-11-15 LAB — SEDIMENTATION RATE: Sed Rate: 42 mm/hr — ABNORMAL HIGH (ref 0–40)

## 2018-11-26 ENCOUNTER — Ambulatory Visit: Payer: Medicare Other | Admitting: Pain Medicine

## 2018-12-05 ENCOUNTER — Encounter: Payer: Self-pay | Admitting: Pain Medicine

## 2018-12-05 ENCOUNTER — Ambulatory Visit
Admission: RE | Admit: 2018-12-05 | Discharge: 2018-12-05 | Disposition: A | Discharge status: Home / Self Care | Payer: Medicare Other | Source: Ambulatory Visit | Attending: Pain Medicine | Admitting: Pain Medicine

## 2018-12-05 ENCOUNTER — Ambulatory Visit (HOSPITAL_BASED_OUTPATIENT_CLINIC_OR_DEPARTMENT_OTHER): Payer: Medicare Other | Admitting: Pain Medicine

## 2018-12-05 ENCOUNTER — Other Ambulatory Visit: Payer: Self-pay

## 2018-12-05 VITALS — BP 142/95 | HR 64 | Temp 97.6°F | Resp 14 | Ht 63.0 in | Wt 155.0 lb

## 2018-12-05 DIAGNOSIS — M5137 Other intervertebral disc degeneration, lumbosacral region: Secondary | ICD-10-CM

## 2018-12-05 DIAGNOSIS — M47816 Spondylosis without myelopathy or radiculopathy, lumbar region: Secondary | ICD-10-CM | POA: Insufficient documentation

## 2018-12-05 DIAGNOSIS — G8929 Other chronic pain: Secondary | ICD-10-CM

## 2018-12-05 DIAGNOSIS — M961 Postlaminectomy syndrome, not elsewhere classified: Secondary | ICD-10-CM

## 2018-12-05 DIAGNOSIS — Z91013 Allergy to seafood: Secondary | ICD-10-CM | POA: Diagnosis not present

## 2018-12-05 DIAGNOSIS — Z91041 Radiographic dye allergy status: Secondary | ICD-10-CM | POA: Diagnosis not present

## 2018-12-05 DIAGNOSIS — M545 Low back pain: Secondary | ICD-10-CM | POA: Insufficient documentation

## 2018-12-05 DIAGNOSIS — M47817 Spondylosis without myelopathy or radiculopathy, lumbosacral region: Secondary | ICD-10-CM

## 2018-12-05 MED ORDER — LACTATED RINGERS IV SOLN
1000.0000 mL | Freq: Once | INTRAVENOUS | Status: AC
  Administered 2018-12-05: 1000 mL via INTRAVENOUS

## 2018-12-05 MED ORDER — MIDAZOLAM HCL 5 MG/5ML IJ SOLN
1.0000 mg | INTRAMUSCULAR | Status: DC | PRN
  Administered 2018-12-05: 14:00:00 5 mg via INTRAVENOUS

## 2018-12-05 MED ORDER — LIDOCAINE HCL 2 % IJ SOLN
20.0000 mL | Freq: Once | INTRAMUSCULAR | Status: AC
  Administered 2018-12-05: 13:00:00 400 mg

## 2018-12-05 MED ORDER — ROPIVACAINE HCL 2 MG/ML IJ SOLN
INTRAMUSCULAR | Status: AC
  Filled 2018-12-05: qty 20

## 2018-12-05 MED ORDER — LIDOCAINE HCL 2 % IJ SOLN
INTRAMUSCULAR | Status: AC
  Filled 2018-12-05: qty 20

## 2018-12-05 MED ORDER — FENTANYL CITRATE (PF) 100 MCG/2ML IJ SOLN
INTRAMUSCULAR | Status: AC
  Filled 2018-12-05: qty 2

## 2018-12-05 MED ORDER — MIDAZOLAM HCL 5 MG/5ML IJ SOLN
INTRAMUSCULAR | Status: AC
  Filled 2018-12-05: qty 5

## 2018-12-05 MED ORDER — FENTANYL CITRATE (PF) 100 MCG/2ML IJ SOLN
25.0000 ug | INTRAMUSCULAR | Status: DC | PRN
  Administered 2018-12-05: 13:00:00 100 ug via INTRAVENOUS

## 2018-12-05 MED ORDER — ROPIVACAINE HCL 2 MG/ML IJ SOLN
18.0000 mL | Freq: Once | INTRAMUSCULAR | Status: AC
  Administered 2018-12-05: 13:00:00 18 mL via PERINEURAL

## 2018-12-05 NOTE — Progress Notes (Signed)
Safety precautions to be maintained throughout the outpatient stay will include: orient to surroundings, keep bed in low position, maintain call bell within reach at all times, provide assistance with transfer out of bed and ambulation.  

## 2018-12-05 NOTE — Patient Instructions (Signed)

## 2018-12-05 NOTE — Progress Notes (Signed)
Patient's Name: [name]  MRN: [id number]  Referring Provider: Virginia Crews, MD  DOB: October 01, 1960  PCP: Virginia Crews, MD  DOS: 12/05/2018  Note by: Gaspar Cola, MD  Service setting: Ambulatory outpatient  Specialty: Interventional Pain Management  Patient type: Established  Location: [facility] (AMB) Pain Management Facility  Visit type: Interventional Procedure   Primary Reason for Visit: Interventional Pain Management Treatment. CC: Back Pain (low mid)  Procedure:          Anesthesia, Analgesia, Anxiolysis:  Type: Lumbar Facet, Medial Branch Block(s) #1 (without steroids)   Primary Purpose: Diagnostic Region: Posterolateral Lumbosacral Spine Level: L2, L3, L4, L5, & S1 Medial Branch Level(s). Injecting these levels blocks the L3-4, L4-5, and L5-S1 lumbar facet joints. Laterality: Bilateral  Type: Moderate (Conscious) Sedation combined with Local Anesthesia Indication(s): Analgesia and Anxiety Route: Intravenous (IV) IV Access: Secured Sedation: Meaningful verbal contact was maintained at all times during the procedure  Local Anesthetic: Lidocaine 1-2%  Position: Prone   Indications: 1. Lumbar facet syndrome (Bilateral) (L>R)   2. Spondylosis without myelopathy or radiculopathy, lumbosacral region   3. DDD (degenerative disc disease), lumbosacral   4. 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 Lumbar spondylosis   6. Chronic low back pain (Primary area of Pain) (Bilateral) (L>R) w/o sciatica   7. History of allergy to radiographic contrast media   8. History of allergy to shellfish    Pain Score: Pre-procedure: 5 /10 Post-procedure: 0-No pain/10   Pre-op Assessment:  [name] is a 58 y.o. (year old), female patient, seen today for interventional treatment. She  has a past surgical history that includes RIGHT HIP ARTHROSCOPY FOR LABRAL TEAR (ABOUT 2010); Anterior fusion cervical spine (MAY 2012); Nasal septum surgery (MARCH 2013); Back  surgery (2009); Tonsillectomy; Abdominal hysterectomy; DIAGNOSTIC LAPAROSCOPIES - MULTIPLE FOR ENDOMETRIOSIS; Cholecystectomy; Hip arthroscopy (09/20/2011); Shoulder arthroscopy (05-06-12); Carpal tunnel release (05-06-12); Total hip arthroplasty (Right, 05/13/2012); Anterior cervical decomp/discectomy fusion (N/A, 10/30/2012); Posterior cervical fusion/foraminotomy (N/A, 04/16/2013); Breast biopsy (Right, 2008); Breast excisional biopsy (Left, 1998); Breast reduction surgery (Bilateral, 06/2016); Colonoscopy with propofol (N/A, 01/26/2017); Esophagogastroduodenoscopy (egd) with propofol (N/A, 01/26/2017); Esophageal dilation (N/A, 01/26/2017); polypectomy (N/A, 01/26/2017); PH impedance study (N/A, 05/30/2017); and Esophageal manometry (N/A, 05/30/2017). [name] has a current medication list which includes the following prescription(s): albuterol, alprazolam, bupropion, dexlansoprazole, docusate sodium, duloxetine, fluticasone-salmeterol, metoprolol succinate, montelukast, ondansetron, oxycodone, oxycodone, oxycodone, pregabalin, aerochamber mv, valacyclovir, and calcium carbonate, and the following Facility-Administered Medications: fentanyl and midazolam. Her primarily concern today is the Back Pain (low mid)  Initial Vital Signs:  Pulse/HCG Rate: 64ECG Heart Rate: (!) 59 Temp: 98.3 F (36.8 C) Resp: 18 BP: (!) 153/103 SpO2: 100 %  BMI: Estimated body mass index is 27.46 kg/m as calculated from the following:   Height as of this encounter: 5\' 3"  (1.6 m).   Weight as of this encounter: 155 lb (70.3 kg).  Risk Assessment: Allergies: Reviewed. She is allergic to amoxicillin; chlorhexidine gluconate; clindamycin; codeine; erythromycin; penicillin g; sulfa antibiotics; levofloxacin; shellfish allergy; decadron [dexamethasone]; mangifera indica; papaya derivatives; betadine [povidone iodine]; chlorhexidine; clarithromycin; clindamycin/lincomycin; other; povidone-iodine; and prednisone.  Allergy Precautions:  None required Coagulopathies: Reviewed. None identified.  Blood-thinner therapy: None at this time Active Infection(s): Reviewed. None identified. [name] is afebrile  Site Confirmation: [name] was asked to confirm the procedure and laterality before marking the site Procedure checklist: Completed Consent: Before the procedure and under the influence of no sedative(s), amnesic(s), or anxiolytics, the  patient was informed of the treatment options, risks and possible complications. To fulfill our ethical and legal obligations, as recommended by the American Medical Association's Code of Ethics, I have informed the patient of my clinical impression; the nature and purpose of the treatment or procedure; the risks, benefits, and possible complications of the intervention; the alternatives, including doing nothing; the risk(s) and benefit(s) of the alternative treatment(s) or procedure(s); and the risk(s) and benefit(s) of doing nothing. The patient was provided information about the general risks and possible complications associated with the procedure. These may include, but are not limited to: failure to achieve desired goals, infection, bleeding, organ or nerve damage, allergic reactions, paralysis, and death. In addition, the patient was informed of those risks and complications associated to Spine-related procedures, such as failure to decrease pain; infection (i.e.: Meningitis, epidural or intraspinal abscess); bleeding (i.e.: epidural hematoma, subarachnoid hemorrhage, or any other type of intraspinal or peri-dural bleeding); organ or nerve damage (i.e.: Any type of peripheral nerve, nerve root, or spinal cord injury) with subsequent damage to sensory, motor, and/or autonomic systems, resulting in permanent pain, numbness, and/or weakness of one or several areas of the body; allergic reactions; (i.e.: anaphylactic reaction); and/or death. Furthermore, the patient was informed of those risks and  complications associated with the medications. These include, but are not limited to: allergic reactions (i.e.: anaphylactic or anaphylactoid reaction(s)); adrenal axis suppression; blood sugar elevation that in diabetics may result in ketoacidosis or comma; water retention that in patients with history of congestive heart failure may result in shortness of breath, pulmonary edema, and decompensation with resultant heart failure; weight gain; swelling or edema; medication-induced neural toxicity; particulate matter embolism and blood vessel occlusion with resultant organ, and/or nervous system infarction; and/or aseptic necrosis of one or more joints. Finally, the patient was informed that Medicine is not an exact science; therefore, there is also the possibility of unforeseen or unpredictable risks and/or possible complications that may result in a catastrophic outcome. The patient indicated having understood very clearly. We have given the patient no guarantees and we have made no promises. Enough time was given to the patient to ask questions, all of which were answered to the patient's satisfaction. [name] has indicated that she wanted to continue with the procedure. Attestation: I, the ordering provider, attest that I have discussed with the patient the benefits, risks, side-effects, alternatives, likelihood of achieving goals, and potential problems during recovery for the procedure that I have provided informed consent. Date   Time: 12/05/2018 12:39 PM  Pre-Procedure Preparation:  Monitoring: As per clinic protocol. Respiration, ETCO2, SpO2, BP, heart rate and rhythm monitor placed and checked for adequate function Safety Precautions: Patient was assessed for positional comfort and pressure points before starting the procedure. Time-out: I initiated and conducted the "Time-out" before starting the procedure, as per protocol. The patient was asked to participate by confirming the accuracy of the  "Time Out" information. Verification of the correct person, site, and procedure were performed and confirmed by me, the nursing staff, and the patient. "Time-out" conducted as per Joint Commission's Universal Protocol ([id number].01.01.01). Time: 1322  Description of Procedure:          Laterality: Bilateral. The procedure was performed in identical fashion on both sides. Levels:  L2, L3, L4, L5, & S1 Medial Branch Level(s) Area Prepped: Posterior Lumbosacral Region Prepping solution: DuraPrep (Iodine Povacrylex [0.7% available iodine] and Isopropyl Alcohol, 74% w/w) Safety Precautions: Aspiration looking for blood return was conducted prior to all  injections. At no point did we inject any substances, as a needle was being advanced. Before injecting, the patient was told to immediately notify me if she was experiencing any new onset of "ringing in the ears, or metallic taste in the mouth". No attempts were made at seeking any paresthesias. Safe injection practices and needle disposal techniques used. Medications properly checked for expiration dates. SDV (single dose vial) medications used. After the completion of the procedure, all disposable equipment used was discarded in the proper designated medical waste containers. Local Anesthesia: Protocol guidelines were followed. The patient was positioned over the fluoroscopy table. The area was prepped in the usual manner. The time-out was completed. The target area was identified using fluoroscopy. A 12-in long, straight, sterile hemostat was used with fluoroscopic guidance to locate the targets for each level blocked. Once located, the skin was marked with an approved surgical skin marker. Once all sites were marked, the skin (epidermis, dermis, and hypodermis), as well as deeper tissues (fat, connective tissue and muscle) were infiltrated with a small amount of a short-acting local anesthetic, loaded on a 10cc syringe with a 25G, 1.5-in  Needle. An appropriate  amount of time was allowed for local anesthetics to take effect before proceeding to the next step. Local Anesthetic: Lidocaine 2.0% The unused portion of the local anesthetic was discarded in the proper designated containers. Technical explanation of process:  L2 Medial Branch Nerve Block (MBB): The target area for the L2 medial branch is at the junction of the postero-lateral aspect of the superior articular process and the superior, posterior, and medial edge of the transverse process of L3. Under fluoroscopic guidance, a Quincke needle was inserted until contact was made with os over the superior postero-lateral aspect of the pedicular shadow (target area). After negative aspiration for blood, 0.5 mL of the nerve block solution was injected without difficulty or complication. The needle was removed intact. L3 Medial Branch Nerve Block (MBB): The target area for the L3 medial branch is at the junction of the postero-lateral aspect of the superior articular process and the superior, posterior, and medial edge of the transverse process of L4. Under fluoroscopic guidance, a Quincke needle was inserted until contact was made with os over the superior postero-lateral aspect of the pedicular shadow (target area). After negative aspiration for blood, 0.5 mL of the nerve block solution was injected without difficulty or complication. The needle was removed intact. L4 Medial Branch Nerve Block (MBB): The target area for the L4 medial branch is at the junction of the postero-lateral aspect of the superior articular process and the superior, posterior, and medial edge of the transverse process of L5. Under fluoroscopic guidance, a Quincke needle was inserted until contact was made with os over the superior postero-lateral aspect of the pedicular shadow (target area). After negative aspiration for blood, 0.5 mL of the nerve block solution was injected without difficulty or complication. The needle was removed  intact. L5 Medial Branch Nerve Block (MBB): The target area for the L5 medial branch is at the junction of the postero-lateral aspect of the superior articular process and the superior, posterior, and medial edge of the sacral ala. Under fluoroscopic guidance, a Quincke needle was inserted until contact was made with os over the superior postero-lateral aspect of the pedicular shadow (target area). After negative aspiration for blood, 0.5 mL of the nerve block solution was injected without difficulty or complication. The needle was removed intact. S1 Medial Branch Nerve Block (MBB): The target area  for the S1 medial branch is at the posterior and inferior 6 o'clock position of the L5-S1 facet joint. Under fluoroscopic guidance, the Quincke needle inserted for the L5 MBB was redirected until contact was made with os over the inferior and postero aspect of the sacrum, at the 6 o' clock position under the L5-S1 facet joint (Target area). After negative aspiration for blood, 0.5 mL of the nerve block solution was injected without difficulty or complication. The needle was removed intact.  Nerve block solution: 0.2% PF-Ropivacaine           The unused portion of the solution was discarded in the proper designated containers. Procedural Needles: 22-gauge, 3.5-inch, Quincke needles used for all levels.  Once the entire procedure was completed, the treated area was cleaned, making sure to leave some of the prepping solution back to take advantage of its long term bactericidal properties.   Illustration of the posterior view of the lumbar spine and the posterior neural structures. Laminae of L2 through S1 are labeled. DPRL5, dorsal primary ramus of L5; DPRS1, dorsal primary ramus of S1; DPR3, dorsal primary ramus of L3; FJ, facet (zygapophyseal) joint L3-L4; I, inferior articular process of L4; LB1, lateral branch of dorsal primary ramus of L1; IAB, inferior articular branches from L3 medial branch (supplies L4-L5  facet joint); IBP, intermediate branch plexus; MB3, medial branch of dorsal primary ramus of L3; NR3, third lumbar nerve root; S, superior articular process of L5; SAB, superior articular branches from L4 (supplies L4-5 facet joint also); TP3, transverse process of L3.  Vitals:   12/05/18 1343 12/05/18 1353 12/05/18 1403 12/05/18 1413  BP: (!) 149/96 (!) 134/99 (!) 141/97 (!) 142/95  Pulse:      Resp: 11 11 13 14   Temp:  (!) 97.5 F (36.4 C)  97.6 F (36.4 C)  SpO2: 100% 97% 100% 100%  Weight:      Height:         Start Time: 1322 hrs. End Time: 1339 hrs.  Imaging Guidance (Spinal):          Type of Imaging Technique: Fluoroscopy Guidance (Spinal) Indication(s): Assistance in needle guidance and placement for procedures requiring needle placement in or near specific anatomical locations not easily accessible without such assistance. Exposure Time: Please see nurses notes. Contrast: None used. Fluoroscopic Guidance: I was personally present during the use of fluoroscopy. "Tunnel Vision Technique" used to obtain the best possible view of the target area. Parallax error corrected before commencing the procedure. "Direction-depth-direction" technique used to introduce the needle under continuous pulsed fluoroscopy. Once target was reached, antero-posterior, oblique, and lateral fluoroscopic projection used confirm needle placement in all planes. Images permanently stored in EMR.      Interpretation: No contrast injected. I personally interpreted the imaging intraoperatively. Adequate needle placement confirmed in multiple planes. Permanent images saved into the patient's record.  Antibiotic Prophylaxis:   Anti-infectives (From admission, onward)   None     Indication(s): None identified  Post-operative Assessment:  Post-procedure Vital Signs:  Pulse/HCG Rate: 64(!) 58 Temp: 97.6 F (36.4 C) Resp: 14 BP: (!) 142/95 SpO2: 100 %  EBL: None  Complications: No immediate  post-treatment complications observed by team, or reported by patient.  Note: The patient tolerated the entire procedure well. A repeat set of vitals were taken after the procedure and the patient was kept under observation following institutional policy, for this type of procedure. Post-procedural neurological assessment was performed, showing return to baseline, prior to discharge. The patient  was provided with post-procedure discharge instructions, including a section on how to identify potential problems. Should any problems arise concerning this procedure, the patient was given instructions to immediately contact us, at any time, without hesitation. In any case, we plan to contact the patient by telephone for a follow-up status report regarding this interventional procedure.  Comments:  No additional relevant information.  Plan of Care  Orders:  Orders Placed This Encounter  Procedures   LUMBAR FACET(MEDIAL BRANCH NERVE BLOCK) MBNB    Scheduling Instructions:     Side: Bilateral     Level: L3-4, L4-5, & L5-S1 Facets (L2, L3, L4, L5, & S1 Medial Branch Nerves)     Sedation: With Sedation.     Timeframe: Today    Order Specific Question:   Where will this procedure be performed?    Answer:   [facility]   DG PAIN [facility] C-ARM 1-60 MIN NO REPORT    Intraoperative interpretation by procedural physician at [place].    Standing Status:   Standing    Number of Occurrences:   1    Order Specific Question:   Reason for exam:    Answer:   Assistance in needle guidance and placement for procedures requiring needle placement in or near specific anatomical locations not easily accessible without such assistance.   Informed Consent Details: Physician/Practitioner Attestation; Transcribe to consent form and obtain patient signature    Provider Attestation: I, [place] Dossie Arbour, MD, (Pain Management Specialist), the physician/practitioner, attest that I have discussed with  the patient the benefits, risks, side effects, alternatives, likelihood of achieving goals and potential problems during recovery for the procedure that I have provided informed consent.    Scheduling Instructions:     Procedure: Lumbar Facet Block  under fluoroscopic guidance     Indication(s): Low Back Pain, with our without leg pain, due to Facet Joint Arthralgia (Joint Pain) known as Lumbar Facet Syndrome, secondary to Lumbar, and/or Lumbosacral Spondylosis (Arthritis of the Spine), without myelopathy or radiculopathy (Nerve Damage).     Note: Always confirm laterality of pain with [name], before procedure.   Miscellanous precautions    NOTE: Although It is true that patients can have allergies to shellfish and that shellfish contain iodine, most shellfish  allergies are due to two protein allergens present in the shellfish: tropomyosins and parvalbumin. Not all patients with shellfish allergies are allergic to iodine. However, as a precaution, avoid using iodine containing products.    Standing Status:   Standing    Number of Occurrences:   1   Chronic Opioid Analgesic:  Oxycodone IR 5 mg, 1 tab PO q 6 hrs (20 mg/day of oxycodone) (has enough until 11/27/2018) MME/day: 30 mg/day.   Medications ordered for procedure: Meds ordered this encounter  Medications   lidocaine (XYLOCAINE) 2 % (with pres) injection 400 mg   lactated ringers infusion 1,000 mL   midazolam (VERSED) 5 MG/5ML injection 1-2 mg    Make sure Flumazenil is available in the pyxis when using this medication. If oversedation occurs, administer 0.2 mg IV over 15 sec. If after 45 sec no response, administer 0.2 mg again over 1 min; may repeat at 1 min intervals; not to exceed 4 doses (1 mg)   fentaNYL (SUBLIMAZE) injection 25-50 mcg    Make sure Narcan is available in the pyxis when using this medication. In the event of respiratory depression (RR< 8/min): Titrate NARCAN (naloxone) in increments of 0.1 to 0.2 mg IV  at 2-3  minute intervals, until desired degree of reversal.   ropivacaine (PF) 2 mg/mL (0.2%) (NAROPIN) injection 18 mL   Medications administered: We administered lidocaine, lactated ringers, midazolam, fentaNYL, and ropivacaine (PF) 2 mg/mL (0.2%).  See the medical record for exact dosing, route, and time of administration.  Follow-up plan:   Return in about 2 weeks (around 12/19/2018) for (VV), (PP).       Interventional management options: Considering:   NOTE: SHELLFISH ALLERGY; Dexamethasone & Prednisone allergy Palliativebilateral suprascapular nerve block(without steroids) #3 Possiblerightsuprascapular nerve RFA #1   Palliative PRN treatment(s):   Diagnostic bilateral lumbar facet block #2  Palliativebilateral suprascapular NB #3(No Steroids) Palliative leftsuprascapular nerve RFA #2(last done on 01/04/2017)    Recent Visits Date Type Provider Dept  11/12/18 Procedure visit Milinda Pointer, MD [facility]  09/30/18 Office Visit Milinda Pointer, MD [facility]  Showing recent visits within past 90 days and meeting all other requirements   Today's Visits Date Type Provider Dept  12/05/18 Procedure visit Milinda Pointer, MD [facility]  Showing today's visits and meeting all other requirements   Future Appointments Date Type Provider Dept  12/30/18 Appointment Milinda Pointer, MD [facility]  Showing future appointments within next 90 days and meeting all other requirements   Disposition: Discharge home  Discharge Date & Time: 12/05/2018; 1414 hrs.   Primary Care Physician: Virginia Crews, MD Location: [facility] Note by: Gaspar Cola, MD Date: 12/05/2018; Time: 2:39 PM  Disclaimer:  Medicine is not an Chief Strategy Officer. The only guarantee in medicine is that nothing is guaranteed. It is important to note that the decision to proceed with this intervention was based on the  information collected from the patient. The Data and conclusions were drawn from the patient's questionnaire, the interview, and the physical examination. Because the information was provided in large part by the patient, it cannot be guaranteed that it has not been purposely or unconsciously manipulated. Every effort has been made to obtain as much relevant data as possible for this evaluation. It is important to note that the conclusions that lead to this procedure are derived in large part from the available data. Always take into account that the treatment will also be dependent on availability of resources and existing treatment guidelines, considered by other Pain Management Practitioners as being common knowledge and practice, at the time of the intervention. For Medico-Legal purposes, it is also important to point out that variation in procedural techniques and pharmacological choices are the acceptable norm. The indications, contraindications, technique, and results of the above procedure should only be interpreted and judged by a Board-Certified Interventional Pain Specialist with extensive familiarity and expertise in the same exact procedure and technique.

## 2018-12-06 ENCOUNTER — Telehealth: Payer: Self-pay

## 2018-12-06 NOTE — Telephone Encounter (Signed)
Post procedure phone call.  Patient states that she is having pain and has some bruises where he injected her. States her pain has returned.  States the pain was good when she left yesterday.  States she did not have steroid.  Instructed patient to monitor bruises and to call us if they get any worse.  Instructed her to document on her pain diary.  Instructed to call us for any other questions or concerns.

## 2018-12-09 ENCOUNTER — Other Ambulatory Visit: Payer: Self-pay | Admitting: Family Medicine

## 2018-12-09 ENCOUNTER — Telehealth: Payer: Self-pay

## 2018-12-09 NOTE — Telephone Encounter (Signed)
She had lumbar facets last week and is in more pain now than before. Pain Is all over. Suggestions?

## 2018-12-09 NOTE — Telephone Encounter (Signed)
Patient advised is normal to have increase pain following a procedure. Advised to apply heat to the lower back. Call us back if needed.

## 2018-12-19 ENCOUNTER — Telehealth: Payer: Self-pay

## 2018-12-19 NOTE — Telephone Encounter (Signed)
FYI...  Pt called reporting "extreme chest pain, like a heart attack"  She states it was worse last night and also had nausea.  She stated "if I survived the night I would call the doctors office in the  Morning."  She says the pain has improved this morning and wanted to know what to do.  She thinks it was reflux but is not sure.  I advised her we cannot rule out a heart attack here that she would have to be evaluated at the ED.  She stated she did not want to go and said "Well I'm not dead yet." I again explain the only way to rule out a heart attack is to be evaluated at the ED.  She finally agreed to go.     Thanks,   -[name]

## 2018-12-20 ENCOUNTER — Other Ambulatory Visit: Payer: Self-pay | Admitting: Family Medicine

## 2018-12-25 NOTE — Progress Notes (Signed)
Subjective:   [name] is a 58 y.o. female who presents for Medicare Annual (Subsequent) preventive examination.    This visit is being conducted through telemedicine due to the COVID-19 pandemic. This patient has given me verbal consent via doximity to conduct this visit, patient states they are participating from their home address. Some vital signs may be absent or patient reported.    Patient identification: identified by name, DOB, and current address  Review of Systems:  N/A  Cardiac Risk Factors include: advanced age (>75men, >46 women)     Objective:     Vitals: There were no vitals taken for this visit.  There is no height or weight on file to calculate BMI. Unable to obtain vitals due to visit being conducted via telephonically.   Advanced Directives 12/26/2018 12/05/2018 05/29/2018 02/26/2018 12/25/2017 08/21/2017 05/21/2017  Does Patient Have a Medical Advance Directive? No No No No No No No  Type of Advance Directive - - - - - - -  Does patient want to make changes to medical advance directive? - - - - - - -  Copy of City of [place] in Chart? - - - - - - -  Would patient like information on creating a medical advance directive? No - Patient declined - No - Patient declined - No - Patient declined No - Patient declined No - Patient declined  Pre-existing out of facility DNR order (yellow form or pink MOST form) - - - - - - -    Tobacco Social History   Tobacco Use  Smoking Status Never Smoker  Smokeless Tobacco Never Used     Counseling given: Not Answered   Clinical Intake:  Pre-visit preparation completed: Yes  Pain : No/denies pain Pain Score: 5  Pain Type: Chronic pain(joint pain from arthritis) Pain Descriptors / Indicators: Aching     Nutritional Risks: Nausea/ vomitting/ diarrhea(Nausea intermittenly due to acid refulx.) Diabetes: No  How often do you need to have someone help you when you read instructions, pamphlets, or  other written materials from your doctor or pharmacy?: 3 - Sometimes(Needs a new eye glass prescription.)  Interpreter Needed?: No  Information entered by :: [facility], LPN  Past Medical History:  Diagnosis Date  . Abnormal EKG    HX OF INVERTED T WAVES ON EKG, PALPITATIONS, CHEST PAINS-CARDIAC WORK UP DID NOT SHOW ANY HEART DISEASE  . Acute postoperative pain 01/04/2017  . 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 Anemia    Iron Infusion-8 yrs ago  . Anxiety   . Asthma    INHALERS WITH FLARE -UPS  . Cephalalgia 08/18/2014  . Cervical disc disease 08/18/2014   Needs neck surgery.   . Chronic headaches   . DDD (degenerative disc disease)    CERVICAL AND LUMBAR-CHRONIC PAIN, RT HIP LABRAL TEAR  . Depression    PT STATES A LOT OF STRESS IN HER LIFE  . Dissociative disorder   . Dizziness 04/22/2013  . Duodenal ulcer with hemorrhage and perforation ([place]) 04/27/2003  . GERD (gastroesophageal reflux disease)   . H/O arthrodesis 08/18/2014  . Headache(784.0)    AND NECK PAIN--STATES RECENT TEST SHOW CERVICAL DEGENERATION  . History of blood transfusion    s/p back surgery  . History of cervical spinal surgery 01/04/2015  . History of kidney stones   . Hypertension   . Inverted T wave   . Narrowing of intervertebral disc space 08/18/2014   Currently on disability.   . Orthostatic hypotension 04/22/2013  .  Pain    CHRONIC NECK AND BACK PAIN - LIMITED ROM NECK - S/P FUSIONS CERVICAL AND LUMBAR  . Pneumonia   . PONV (postoperative nausea and vomiting)    PT GIVES HX OF N&V AND FEVER WITH SURGERIES YEARS AGO--BUT NO PROBLEMS WITH MORE RECENT SURGERIES--STATES NOT MALIGNANT HYPERTHERMIA  . Postop Hyponatremia 05/14/2012  . Postoperative anemia due to acute blood loss 05/14/2012  . PTSD (post-traumatic stress disorder)   . Right foot drop   . 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    Past Surgical History:  Procedure Laterality Date  . ABDOMINAL HYSTERECTOMY    .  ANTERIOR CERVICAL DECOMP/DISCECTOMY FUSION N/A 10/30/2012   Procedure: ACDF C5-6, EXPLORATION AND HARDWARE REMOVAL C6-7;  Surgeon: Melina Schools, MD;  Location: [place];  Service: Orthopedics;  Laterality: N/A;  . ANTERIOR FUSION CERVICAL SPINE  MAY 2012   AT [facility]  . BACK SURGERY  2009   LUMBAR FUSION WITH RODS   . BREAST BIOPSY Right 2008   benign.- bx/clip  . BREAST EXCISIONAL BIOPSY Left 1998   benign  . BREAST REDUCTION SURGERY Bilateral 06/2016  . CARPAL TUNNEL RELEASE  05-06-12   Right  . CHOLECYSTECTOMY    . COLONOSCOPY WITH PROPOFOL N/A 01/26/2017   Procedure: COLONOSCOPY WITH PROPOFOL;  Surgeon: Lucilla Lame, MD;  Location: [place];  Service: Endoscopy;  Laterality: N/A;  . DIAGNOSTIC LAPAROSCOPIES - MULTIPLE FOR ENDOMETRIOSIS    . ESOPHAGEAL DILATION N/A 01/26/2017   Procedure: ESOPHAGEAL DILATION;  Surgeon: Lucilla Lame, MD;  Location: [place];  Service: Endoscopy;  Laterality: N/A;  . ESOPHAGEAL MANOMETRY N/A 05/30/2017   Procedure: ESOPHAGEAL MANOMETRY (EM);  Surgeon: Lucilla Lame, MD;  Location: [facility];  Service: Endoscopy;  Laterality: N/A;  . ESOPHAGOGASTRODUODENOSCOPY (EGD) WITH PROPOFOL N/A 01/26/2017   Procedure: ESOPHAGOGASTRODUODENOSCOPY (EGD) WITH PROPOFOL;  Surgeon: Lucilla Lame, MD;  Location: [place];  Service: Endoscopy;  Laterality: N/A;  . HIP ARTHROSCOPY  09/20/2011   Procedure: ARTHROSCOPY HIP;  Surgeon: Gearlean Alf, MD;  Location: [facility];  Service: Orthopedics;  Laterality: Right;  Right Hip Scope with Labral Debridement  . NASAL SEPTUM SURGERY  MARCH 2013   IN [place]  . [place] IMPEDANCE STUDY N/A 05/30/2017   Procedure: [place] IMPEDANCE STUDY;  Surgeon: Lucilla Lame, MD;  Location: [facility];  Service: Endoscopy;  Laterality: N/A;  . POLYPECTOMY N/A 01/26/2017   Procedure: POLYPECTOMY;  Surgeon: Lucilla Lame, MD;  Location: [place];  Service: Endoscopy;  Laterality: N/A;  . POSTERIOR CERVICAL  FUSION/FORAMINOTOMY N/A 04/16/2013   Procedure: REMOVAL CERVICAL PLATES AND INTERBODY CAGE/POSTERIOR CERVICAL SPINAL FUSION C4 - C6/C5 CORPECTOMY/C4 - C6 FUSION WITH ILIAC CREST BONE GRAFT;  Surgeon: Melina Schools, MD;  Location: [place];  Service: Orthopedics;  Laterality: N/A;  . RADIOFREQUENCY ABLATION NERVES    . RIGHT HIP ARTHROSCOPY FOR LABRAL TEAR  ABOUT 2010   2012 also  . SHOULDER ARTHROSCOPY  05-06-12   bone spur  . TONSILLECTOMY     AS A CHILD  . TOTAL HIP ARTHROPLASTY Right 05/13/2012   Procedure: TOTAL HIP ARTHROPLASTY ANTERIOR APPROACH;  Surgeon: Gearlean Alf, MD;  Location: [facility];  Service: Orthopedics;  Laterality: Right;   Family History  Problem Relation Age of Onset  . Aneurysm Mother   . Aneurysm Maternal Grandmother   . Breast cancer Paternal Grandmother 76  . Bipolar disorder Sister   . Bipolar disorder Grandchild   .  Anxiety disorder Grandchild   . Depression Grandchild    Social History   Socioeconomic History  . Marital status: Married    Spouse name: bruce  . Number of children: 2  . Years of education: Not on file  . Highest education level: Associate degree: occupational, Hotel manager, or vocational program  Occupational History    Comment: disabled  Social Needs  . Financial resource strain: Very hard  . Food insecurity    Worry: Sometimes true    Inability: Often true  . Transportation needs    Medical: No    Non-medical: No  Tobacco Use  . Smoking status: Never Smoker  . Smokeless tobacco: Never Used  Substance and Sexual Activity  . Alcohol use: No    Alcohol/week: 0.0 standard drinks  . Drug use: No  . Sexual activity: Yes  Lifestyle  . Physical activity    Days per week: 0 days    Minutes per session: 0 min  . Stress: Very much  Relationships  . Social connections    Talks on phone: More than three times a week    Gets together: Never    Attends religious service: More than 4 times per year    Active member of club or organization:  No    Attends meetings of clubs or organizations: Never    Relationship status: Married  Other Topics Concern  . Not on file  Social History Narrative   Son and granddaughter    Outpatient Encounter Medications as of 12/26/2018  Medication Sig  . acetaminophen (TYLENOL) 500 MG tablet Take 500 mg by mouth every 6 (six) hours as needed.  Marland Kitchen albuterol (PROVENTIL HFA) 108 (90 Base) MCG/ACT inhaler Inhale 1-2 puffs into the lungs every 4 (four) hours as needed.   . ALPRAZolam (XANAX) 1 MG tablet Take 1 mg by mouth at bedtime as needed for anxiety.  . calcium carbonate (TUMS - DOSED IN MG ELEMENTAL CALCIUM) 500 MG chewable tablet Chew 1 tablet by mouth 3 (three) times daily with meals. As needed  . dexlansoprazole (DEXILANT) 60 MG capsule Take 1 capsule (60 mg total) by mouth daily. **PLEASE SCHEDULE FOLLOW UP APPT**  . Docusate Sodium (COLACE PO) Take by mouth daily.   . Fluticasone-Salmeterol (ADVAIR) 500-50 MCG/DOSE AEPB Inhale 1 puff into the lungs 2 (two) times daily. As needed  . metoprolol succinate (TOPROL-XL) 50 MG 24 hr tablet TAKE 1 TABLET BY MOUTH EVERY DAY  . montelukast (SINGULAIR) 10 MG tablet TAKE 1 TABLET (10 MG TOTAL) BY MOUTH DAILY.  Marland Kitchen ondansetron (ZOFRAN) 4 MG tablet TAKE 1 TABLET BY MOUTH EVERY 8 HOURS AS NEEDED FOR NAUSEA AND VOMITING  . oxyCODONE (OXY IR/ROXICODONE) 5 MG immediate release tablet Take 1 tablet (5 mg total) by mouth every 6 (six) hours as needed for severe pain. Must last 30 days  . Spacer/Aero-Holding Chambers (AEROCHAMBER MV) inhaler by Does not apply route.  . valACYclovir (VALTREX) 1000 MG tablet TAKE TWO TABLETS BY MOUTH TWICE DAILY FOR ONE DAY FEVER BLISTER  . buPROPion (WELLBUTRIN XL) 300 MG 24 hr tablet TAKE 1 TABLET BY MOUTH EVERY DAY (Patient not taking: Reported on 12/26/2018)  . calcium carbonate (CALCIUM 600) 600 MG TABS tablet Take 1 tablet (600 mg total) by mouth 2 (two) times daily with a meal for 30 days. (Patient not taking: Reported on  12/26/2018)  . DULoxetine (CYMBALTA) 30 MG capsule TAKE 1 CAPSULE BY MOUTH EVERY DAY (Patient not taking: Reported on 12/26/2018)  . [START ON  12/27/2018] oxyCODONE (OXY IR/ROXICODONE) 5 MG immediate release tablet Take 1 tablet (5 mg total) by mouth every 6 (six) hours as needed for severe pain. Must last 30 days (Patient not taking: Reported on 12/26/2018)  . [START ON 01/26/2019] oxyCODONE (OXY IR/ROXICODONE) 5 MG immediate release tablet Take 1 tablet (5 mg total) by mouth every 6 (six) hours as needed for severe pain. Must last 30 days (Patient not taking: Reported on 12/26/2018)  . pregabalin (LYRICA) 100 MG capsule Take 1 capsule (100 mg total) by mouth 2 (two) times daily for 7 days, THEN 1 capsule (100 mg total) 3 (three) times daily. (Patient not taking: Reported on 12/26/2018)   No facility-administered encounter medications on file as of 12/26/2018.     Activities of Daily Living In your present state of health, do you have any difficulty performing the following activities: 12/26/2018  Hearing? N  Vision? Y  Comment Needs a new eye glass prescription.  Difficulty concentrating or making decisions? N  Walking or climbing stairs? Y  Comment Due to hip pains.  Dressing or bathing? N  Doing errands, shopping? N  Preparing Food and eating ? N  Using the Toilet? N  In the past six months, have you accidently leaked urine? N  Do you have problems with loss of bowel control? N  Managing your Medications? N  Managing your Finances? N  Housekeeping or managing your Housekeeping? N  Some recent data might be hidden    Patient Care Team: Virginia Crews, MD as PCP - General (Family Medicine) Dorann Ou Montine Circle, NP as Nurse Practitioner (Psychiatry) Lucilla Lame, MD as Consulting Physician (Gastroenterology)    Assessment:   This is a routine wellness examination for [place].  Exercise Activities and Dietary recommendations Current Exercise Habits: The patient does not  participate in regular exercise at present, Exercise limited by: None identified  Goals    . Increase water intake     Recommend increasing fluid intake to 4-6 glasses each day    . LIFESTYLE - DECREASE FALLS RISK     Recommend to remove any items from the home that may cause slips or trips.       Fall Risk: Fall Risk  12/26/2018 12/05/2018 11/12/2018 09/26/2018 05/29/2018  Falls in the past year? 1 0 1 0 0  Comment - - - - -  Number falls in past yr: 1 - 1 0 -  Comment - - - - -  Injury with Fall? 0 - 1 0 -  Comment - - bumps and bruises - -  Risk Factor Category  - - - - -  Comment - - - - -  Risk for fall due to : Impaired mobility;Other (Comment) - History of fall(s);Impaired balance/gait;Impaired mobility - -  Risk for fall due to: Comment drop foot - - - -  Follow up Falls prevention discussed - Education provided - -  Comment - - - - -    FALL RISK PREVENTION PERTAINING TO THE HOME:  Any stairs in or around the home? Yes  If so, are there any without handrails? No   Home free of loose throw rugs in walkways, pet beds, electrical cords, etc? Yes  Adequate lighting in your home to reduce risk of falls? Yes   ASSISTIVE DEVICES UTILIZED TO PREVENT FALLS:  Life alert? No  Use of a cane, walker or w/c? Yes  Grab bars in the bathroom? No  Shower chair or bench in shower? No  Elevated  toilet seat or a handicapped toilet? No    TIMED UP AND GO:  Was the test performed? No .    Depression Screen PHQ 2/9 Scores 12/26/2018 12/05/2018 11/12/2018 09/26/2018  PHQ - 2 Score 6 0 5 0  PHQ- 9 Score 19 - 15 -  Exception Documentation - - - -  Not completed - - - -     Cognitive Function: Declined today.     6CIT Screen 12/07/2016  What Year? 0 points  What month? 0 points  What time? 0 points  Count back from 20 0 points  Months in reverse 0 points  Repeat phrase 0 points  Total Score 0    Immunization History  Administered Date(s) Administered  . Influenza,inj,Quad  PF,6+ Mos 12/30/2015, 12/07/2016, 12/25/2017  . Pneumococcal Polysaccharide-23 12/30/2015    Qualifies for Shingles Vaccine? No    Tdap: Although this vaccine is not a covered service during a Wellness Exam, does the patient still wish to receive this vaccine today?  No .   Flu Vaccine: Due for Flu vaccine. Does the patient want to receive this vaccine today?  No .    Screening Tests Health Maintenance  Topic Date Due  . TETANUS/TDAP  06/01/1978  . MAMMOGRAM  02/16/2018  . INFLUENZA VACCINE  10/12/2018  . COLONOSCOPY  01/26/2022  . Hepatitis C Screening  Completed  . HIV Screening  Completed    Cancer Screenings:  Colorectal Screening: Completed 01/26/17. Repeat every 5 years.  Mammogram: Completed 02/17/16. Ordered 10/15/18. Pt declined scheduling apt at this time.   Lung Cancer Screening: (Low Dose CT Chest recommended if Age 56-80 years, 30 pack-year currently smoking OR have quit w/in 15years.) does not qualify.   Additional Screening:  Hepatitis C Screening: Up to date  Vision Screening: Recommended annual ophthalmology exams for early detection of glaucoma and other disorders of the eye.  Dental Screening: Recommended annual dental exams for proper oral hygiene  Community Resource Referral:  CRR required this visit? Yes, referral sent for food and financial assistance.      Plan:  I have personally reviewed and addressed the Medicare Annual Wellness questionnaire and have noted the following in the patient's chart:  A. Medical and social history B. Use of alcohol, tobacco or illicit drugs  C. Current medications and supplements D. Functional ability and status E.  Nutritional status F.  Physical activity G. Advance directives H. List of other physicians I.  Hospitalizations, surgeries, and ER visits in previous 12 months J.  [place] such as hearing and vision if needed, cognitive and depression L. Referrals and appointments   In addition, I  have reviewed and discussed with patient certain preventive protocols, quality metrics, and best practice recommendations. A written personalized care plan for preventive services as well as general preventive health recommendations were provided to patient. Nurse Health Advisor  Signed,    Althea Backs [place], [place]  [id number] Nurse Health Advisor   Nurse Notes: Failed Depression screening. Pt declined setting up a mammogram at this time due to financial issues and gas. Referral sent to [facility] for help. Pt to receive a flu shot at next in office apt (12/27/18).

## 2018-12-26 ENCOUNTER — Encounter: Payer: Self-pay | Admitting: Pain Medicine

## 2018-12-26 ENCOUNTER — Other Ambulatory Visit: Payer: Self-pay

## 2018-12-26 ENCOUNTER — Ambulatory Visit (INDEPENDENT_AMBULATORY_CARE_PROVIDER_SITE_OTHER): Payer: Medicare Other

## 2018-12-26 DIAGNOSIS — Z599 Problem related to housing and economic circumstances, unspecified: Secondary | ICD-10-CM

## 2018-12-26 DIAGNOSIS — F331 Major depressive disorder, recurrent, moderate: Secondary | ICD-10-CM

## 2018-12-26 DIAGNOSIS — Z598 Other problems related to housing and economic circumstances: Secondary | ICD-10-CM

## 2018-12-26 DIAGNOSIS — Z0000 Encounter for general adult medical examination without abnormal findings: Secondary | ICD-10-CM

## 2018-12-26 NOTE — Patient Instructions (Signed)
[name] , Thank you for taking time to come for your Medicare Wellness Visit. I appreciate your ongoing commitment to your health goals. Please review the following plan we discussed and let me know if I can assist you in the future.   Screening recommendations/referrals: Colonoscopy: Up to date, due 01/2022 Mammogram: Currently due, ordered 10/25/18 Recommended yearly ophthalmology/optometry visit for glaucoma screening and checkup Recommended yearly dental visit for hygiene and checkup  Vaccinations: Influenza vaccine: Currently due Tdap vaccine: Pt declines today.     Advanced directives: Advance directive discussed with you today. Even though you declined this today please call our office should you change your mind and we can give you the proper paperwork for you to fill out.  Conditions/risks identified: Fall risk prevention discussed. Recommend to increase water intake.  Next appointment: 12/27/18 @ 4:00 PM with Dr Brita Romp.     Preventive Care 40-64 Years, Female Preventive care refers to lifestyle choices and visits with your health care provider that can promote health and wellness. What does preventive care include?  A yearly physical exam. This is also called an annual well check.  Dental exams once or twice a year.  Routine eye exams. Ask your health care provider how often you should have your eyes checked.  Personal lifestyle choices, including:  Daily care of your teeth and gums.  Regular physical activity.  Eating a healthy diet.  Avoiding tobacco and drug use.  Limiting alcohol use.  Practicing safe sex.  Taking low-dose aspirin daily starting at age 71.  Taking vitamin and mineral supplements as recommended by your health care provider. What happens during an annual well check? The services and screenings done by your health care provider during your annual well check will depend on your age, overall health, lifestyle risk factors, and family  history of disease. Counseling  Your health care provider may ask you questions about your:  Alcohol use.  Tobacco use.  Drug use.  Emotional well-being.  Home and relationship well-being.  Sexual activity.  Eating habits.  Work and work Statistician.  Method of birth control.  Menstrual cycle.  Pregnancy history. Screening  You may have the following tests or measurements:  Height, weight, and BMI.  Blood pressure.  Lipid and cholesterol levels. These may be checked every 5 years, or more frequently if you are over 47 years old.  Skin check.  Lung cancer screening. You may have this screening every year starting at age 33 if you have a 30-pack-year history of smoking and currently smoke or have quit within the past 15 years.  Fecal occult blood test (FOBT) of the stool. You may have this test every year starting at age 83.  Flexible sigmoidoscopy or colonoscopy. You may have a sigmoidoscopy every 5 years or a colonoscopy every 10 years starting at age 66.  Hepatitis C blood test.  Hepatitis B blood test.  Sexually transmitted disease (STD) testing.  Diabetes screening. This is done by checking your blood sugar (glucose) after you have not eaten for a while (fasting). You may have this done every 1-3 years.  Mammogram. This may be done every 1-2 years. Talk to your health care provider about when you should start having regular mammograms. This may depend on whether you have a family history of breast cancer.  BRCA-related cancer screening. This may be done if you have a family history of breast, ovarian, tubal, or peritoneal cancers.  Pelvic exam and Pap test. This may be done every 3  years starting at age 54. Starting at age 74, this may be done every 5 years if you have a Pap test in combination with an HPV test.  Bone density scan. This is done to screen for osteoporosis. You may have this scan if you are at high risk for osteoporosis. Discuss your test  results, treatment options, and if necessary, the need for more tests with your health care provider. Vaccines  Your health care provider may recommend certain vaccines, such as:  Influenza vaccine. This is recommended every year.  Tetanus, diphtheria, and acellular pertussis (Tdap, Td) vaccine. You may need a Td booster every 10 years.  Zoster vaccine. You may need this after age 71.  Pneumococcal 13-valent conjugate (PCV13) vaccine. You may need this if you have certain conditions and were not previously vaccinated.  Pneumococcal polysaccharide (PPSV23) vaccine. You may need one or two doses if you smoke cigarettes or if you have certain conditions. Talk to your health care provider about which screenings and vaccines you need and how often you need them. This information is not intended to replace advice given to you by your health care provider. Make sure you discuss any questions you have with your health care provider. Document Released: 03/26/2015 Document Revised: 11/17/2015 Document Reviewed: 12/29/2014 Elsevier Interactive Patient Education  2017 [employer] Prevention in the Home Falls can cause injuries. They can happen to people of all ages. There are many things you can do to make your home safe and to help prevent falls. What can I do on the outside of my home?  Regularly fix the edges of walkways and driveways and fix any cracks.  Remove anything that might make you trip as you walk through a door, such as a raised step or threshold.  Trim any bushes or trees on the path to your home.  Use bright outdoor lighting.  Clear any walking paths of anything that might make someone trip, such as rocks or tools.  Regularly check to see if handrails are loose or broken. Make sure that both sides of any steps have handrails.  Any raised decks and porches should have guardrails on the edges.  Have any leaves, snow, or ice cleared regularly.  Use sand or salt on  walking paths during winter.  Clean up any spills in your garage right away. This includes oil or grease spills. What can I do in the bathroom?  Use night lights.  Install grab bars by the toilet and in the tub and shower. Do not use towel bars as grab bars.  Use non-skid mats or decals in the tub or shower.  If you need to sit down in the shower, use a plastic, non-slip stool.  Keep the floor dry. Clean up any water that spills on the floor as soon as it happens.  Remove soap buildup in the tub or shower regularly.  Attach bath mats securely with double-sided non-slip rug tape.  Do not have throw rugs and other things on the floor that can make you trip. What can I do in the bedroom?  Use night lights.  Make sure that you have a light by your bed that is easy to reach.  Do not use any sheets or blankets that are too big for your bed. They should not hang down onto the floor.  Have a firm chair that has side arms. You can use this for support while you get dressed.  Do not have throw rugs  and other things on the floor that can make you trip. What can I do in the kitchen?  Clean up any spills right away.  Avoid walking on wet floors.  Keep items that you use a lot in easy-to-reach places.  If you need to reach something above you, use a strong step stool that has a grab bar.  Keep electrical cords out of the way.  Do not use floor polish or wax that makes floors slippery. If you must use wax, use non-skid floor wax.  Do not have throw rugs and other things on the floor that can make you trip. What can I do with my stairs?  Do not leave any items on the stairs.  Make sure that there are handrails on both sides of the stairs and use them. Fix handrails that are broken or loose. Make sure that handrails are as long as the stairways.  Check any carpeting to make sure that it is firmly attached to the stairs. Fix any carpet that is loose or worn.  Avoid having throw  rugs at the top or bottom of the stairs. If you do have throw rugs, attach them to the floor with carpet tape.  Make sure that you have a light switch at the top of the stairs and the bottom of the stairs. If you do not have them, ask someone to add them for you. What else can I do to help prevent falls?  Wear shoes that:  Do not have high heels.  Have rubber bottoms.  Are comfortable and fit you well.  Are closed at the toe. Do not wear sandals.  If you use a stepladder:  Make sure that it is fully opened. Do not climb a closed stepladder.  Make sure that both sides of the stepladder are locked into place.  Ask someone to hold it for you, if possible.  Clearly mark and make sure that you can see:  Any grab bars or handrails.  First and last steps.  Where the edge of each step is.  Use tools that help you move around (mobility aids) if they are needed. These include:  Canes.  Walkers.  Scooters.  Crutches.  Turn on the lights when you go into a dark area. Replace any light bulbs as soon as they burn out.  Set up your furniture so you have a clear path. Avoid moving your furniture around.  If any of your floors are uneven, fix them.  If there are any pets around you, be aware of where they are.  Review your medicines with your doctor. Some medicines can make you feel dizzy. This can increase your chance of falling. Ask your doctor what other things that you can do to help prevent falls. This information is not intended to replace advice given to you by your health care provider. Make sure you discuss any questions you have with your health care provider. Document Released: 12/24/2008 Document Revised: 08/05/2015 Document Reviewed: 04/03/2014 Elsevier Interactive Patient Education  2017 [employer].

## 2018-12-27 ENCOUNTER — Other Ambulatory Visit: Payer: Self-pay

## 2018-12-27 ENCOUNTER — Encounter: Payer: Self-pay | Admitting: Family Medicine

## 2018-12-27 ENCOUNTER — Ambulatory Visit (INDEPENDENT_AMBULATORY_CARE_PROVIDER_SITE_OTHER): Payer: Medicare Other | Admitting: Family Medicine

## 2018-12-27 VITALS — BP 108/74 | HR 54 | Temp 97.5°F | Resp 16 | Wt 169.0 lb

## 2018-12-27 DIAGNOSIS — E663 Overweight: Secondary | ICD-10-CM

## 2018-12-27 DIAGNOSIS — F332 Major depressive disorder, recurrent severe without psychotic features: Secondary | ICD-10-CM

## 2018-12-27 DIAGNOSIS — Z23 Encounter for immunization: Secondary | ICD-10-CM

## 2018-12-27 DIAGNOSIS — I10 Essential (primary) hypertension: Secondary | ICD-10-CM

## 2018-12-27 NOTE — Assessment & Plan Note (Signed)
Discussed importance of healthy weight management Discussed diet and exercise  

## 2018-12-27 NOTE — Assessment & Plan Note (Signed)
Well controlled Continue current medications Reviewed recent metabolic panel F/u in 6 months  
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
## 2018-12-29 NOTE — Progress Notes (Signed)
Pain Management Virtual Encounter Note - Virtual Visit via Telephone Telehealth (real-time audio visits between healthcare provider and patient).   Patient's Phone No. & Preferred Pharmacy:  [phone] (home); There is no such number on file (mobile).; (Preferred) [phone] [email or web address]  [id number] [address], [place] [address] Phone: [phone] Fax: [phone]    Pre-screening note:  Our staff contacted [name] and offered her an "in person", "face-to-face" appointment versus a telephone encounter. She indicated preferring the telephone encounter, at this time.   Reason for Virtual Visit: COVID-19*  Social distancing based on CDC and AMA recommendations.   I contacted [name] on 12/30/2018 via telephone.      I clearly identified myself as Gaspar Cola, MD. I verified that I was speaking with the correct person using two identifiers (Name: [name], and date of birth: 10/15/1960).  Advanced Informed Consent I sought verbal advanced consent from [name] for virtual visit interactions. I informed [name] of possible security and privacy concerns, risks, and limitations associated with providing "not-in-person" medical evaluation and management services. I also informed [name] of the availability of "in-person" appointments. Finally, I informed her that there would be a charge for the virtual visit and that she could be  personally, fully or partially, financially responsible for it. [name] expressed understanding and agreed to proceed.   Historic Elements   [name] is a 58 y.o. year old, female patient evaluated today after her last encounter by our practice on 12/09/2018. 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), Anemia, Anxiety, Asthma, Cephalalgia (08/18/2014), Cervical disc disease  (08/18/2014), Chronic headaches, DDD (degenerative disc disease), Depression, Dissociative disorder, Dizziness (04/22/2013), Duodenal ulcer with hemorrhage and perforation ([place]) (04/27/2003), GERD (gastroesophageal reflux disease), H/O arthrodesis (08/18/2014), Headache(784.0), History of blood transfusion, History of cervical spinal surgery (01/04/2015), History of kidney stones, Hypertension, Inverted T wave, Narrowing of intervertebral disc space (08/18/2014), Orthostatic hypotension (04/22/2013), Pain, Pneumonia, PONV (postoperative nausea and vomiting), Postop Hyponatremia (05/14/2012), Postoperative anemia due to acute blood loss (05/14/2012), PTSD (post-traumatic stress disorder), Right foot drop, and Right hip arthralgia (08/18/2014). She also  has a past surgical history that includes RIGHT HIP ARTHROSCOPY FOR LABRAL TEAR (ABOUT 2010); Anterior fusion cervical spine (MAY 2012); Nasal septum surgery (MARCH 2013); Back surgery (2009); Tonsillectomy; Abdominal hysterectomy; DIAGNOSTIC LAPAROSCOPIES - MULTIPLE FOR ENDOMETRIOSIS; Cholecystectomy; Hip arthroscopy (09/20/2011); Shoulder arthroscopy (05-06-12); Carpal tunnel release (05-06-12); Total hip arthroplasty (Right, 05/13/2012); Anterior cervical decomp/discectomy fusion (N/A, 10/30/2012); Posterior cervical fusion/foraminotomy (N/A, 04/16/2013); Breast biopsy (Right, 2008); Breast excisional biopsy (Left, 1998); Breast reduction surgery (Bilateral, 06/2016); Colonoscopy with propofol (N/A, 01/26/2017); Esophagogastroduodenoscopy (egd) with propofol (N/A, 01/26/2017); Esophageal dilation (N/A, 01/26/2017); polypectomy (N/A, 01/26/2017); PH impedance study (N/A, 05/30/2017); Esophageal manometry (N/A, 05/30/2017); and Radiofrequency ablation nerves. [name] has a current medication list which includes the following prescription(s): acetaminophen, albuterol, alprazolam, calcium carbonate, dexlansoprazole, docusate sodium, fluticasone-salmeterol, metoprolol succinate, montelukast,  ondansetron, oxycodone, oxycodone, oxycodone, aerochamber mv, and valacyclovir. She  reports that she has never smoked. She has never used smokeless tobacco. She reports that she does not drink alcohol or use drugs. [name] is allergic to amoxicillin; chlorhexidine gluconate; clindamycin; codeine; erythromycin; penicillin g; sulfa antibiotics; levofloxacin; shellfish allergy; decadron [dexamethasone]; mangifera indica; papaya derivatives; betadine [povidone iodine]; chlorhexidine; clarithromycin; clindamycin/lincomycin; other; povidone-iodine; and prednisone.   HPI  Today, she is being contacted for  both, medication management and a post-procedure assessment.  According to the patient, she did well with them block, for the duration of local anesthetic.  Unfortunately, once the local anesthetic wore off, she was very sore for up to 2 weeks, since she did not get any steroids in the solution.  Today we talked about the results and the fact that he confirms that this is pain coming from the facet joints.  Post-Procedure Evaluation  Procedure: Diagnostic bilateral lumbar facet block #1 (without steroids) under fluoroscopic guidance and IV sedation Pre-procedure pain level:  5/10 Post-procedure: 0/10 (100% relief)  Sedation: Sedation provided.  Effectiveness during initial hour after procedure(Ultra-Short Term Relief): 100 %   Local anesthetic used: Long-acting (4-6 hours) Effectiveness: Defined as any analgesic benefit obtained secondary to the administration of local anesthetics. This carries significant diagnostic value as to the etiological location, or anatomical origin, of the pain. Duration of benefit is expected to coincide with the duration of the local anesthetic used.  Effectiveness during initial 4-6 hours after procedure(Short-Term Relief): 100 %   Long-term benefit: Defined as any relief past the pharmacologic duration of the local anesthetics.  Effectiveness past the initial 6 hours  after procedure(Long-Term Relief): 0 % .  No long-term relief expected since the patient did not have any steroids in the solution.  Note: The results of this diagnostic procedure confirm that the pain seems to be coming from the facet joints, bilaterally.  Today we have talked to the patient about possible alternatives and she has decided to proceed with second diagnostic injection followed by radiofrequency ablation, in the event that the results are the same as those documented today.  Pharmacotherapy Assessment  Analgesic: Oxycodone IR 5 mg, 1 tab PO q 6 hrs (20 mg/day of oxycodone) (has enough until 11/27/2018) MME/day: 30 mg/day.   Monitoring: Pharmacotherapy: No side-effects or adverse reactions reported. [place] PMP: PDMP reviewed during this encounter.       Compliance: No problems identified. Effectiveness: Clinically acceptable. Plan: Refer to "POC".  UDS:  Summary  Date Value Ref Range Status  11/11/2018 Note  Final    Comment:    ==================================================================== [employer] Select 13 (MW) ==================================================================== Test                             Result       Flag       Units Drug Present and Declared for Prescription Verification   Alprazolam                     167          EXPECTED   ng/mg creat   Alpha-hydroxyalprazolam        564          EXPECTED   ng/mg creat    Source of alprazolam is a scheduled prescription medication. Alpha-    hydroxyalprazolam is an expected metabolite of alprazolam.   Oxycodone                      2512         EXPECTED   ng/mg creat   Oxymorphone                    1271         EXPECTED   ng/mg creat   Noroxycodone  6433         EXPECTED   ng/mg creat   Noroxymorphone                 145          EXPECTED   ng/mg creat    Sources of oxycodone are scheduled prescription medications.    Oxymorphone, noroxycodone, and noroxymorphone are expected     metabolites of oxycodone. Oxymorphone is also available as a    scheduled prescription medication. ==================================================================== Test                      Result    Flag   Units      Ref Range   Creatinine              126              mg/dL      >=20 ==================================================================== Declared Medications:  The flagging and interpretation on this report are based on the  following declared medications.  Unexpected results may arise from  inaccuracies in the declared medications.  **Note: The testing scope of this panel includes these medications:  Alprazolam  Oxycodone  **Note: The testing scope of this panel does not include the  following reported medications:  Albuterol  Bupropion (Wellbutrin)  Calcium  Dexlansoprazole (Dexilant)  Docusate  Duloxetine  Fluticasone  Magnesium (Mag-Ox)  Metoprolol  Montelukast  Ondansetron  Pregabalin  Salmeterol  Valacyclovir ==================================================================== For clinical consultation, please call [phone]. ====================================================================    Laboratory Chemistry Profile (12 mo)  Renal: 11/11/2018: BUN 6; BUN/Creatinine Ratio 7; Creatinine, Ser 0.90  Lab Results  Component Value Date   GFRAA 81 11/11/2018   GFRNONAA 70 11/11/2018   Hepatic: 11/11/2018: Albumin 4.0 Lab Results  Component Value Date   AST 28 11/11/2018   ALT 9 12/25/2017   Other: 11/11/2018: 25-Hydroxy, Vitamin D 23; 25-Hydroxy, Vitamin D-2 9.8; 25-Hydroxy, Vitamin D-3 13; CRP 12; Sed Rate 42; Vitamin B-12 427 Note: Above Lab results reviewed.  Imaging  Last 90 days:  Dg Pain Clinic C-arm 1-60 Min No Report  Result Date: 12/05/2018 Fluoro was used, but no Radiologist interpretation will be provided. Please refer to "NOTES" tab for provider progress note.      Assessment  The primary encounter diagnosis was  Chronic pain syndrome. Diagnoses of Chronic low back pain (Primary area of Pain) (Bilateral) (L>R) w/o sciatica, Chronic hip pain (Secondary Area of Pain) (Bilateral) (L>R), Chronic shoulder pain (Tertiary Area of Pain)  (Bilateral) (L>R), and Lumbar facet syndrome (Bilateral) (L>R) were also pertinent to this visit.  Plan of Care  I have discontinued [name]. Pennings's DULoxetine, buPROPion, and pregabalin. I am also having her start on oxyCODONE. Additionally, I am having her maintain her AeroChamber MV, albuterol, Docusate Sodium (COLACE PO), Fluticasone-Salmeterol, valACYclovir, ALPRAZolam, montelukast, dexlansoprazole, oxyCODONE, ondansetron, metoprolol succinate, acetaminophen, calcium carbonate, and oxyCODONE.  Pharmacotherapy (Medications Ordered): Meds ordered this encounter  Medications  . oxyCODONE (OXY IR/ROXICODONE) 5 MG immediate release tablet    Sig: Take 1 tablet (5 mg total) by mouth every 6 (six) hours as needed for severe pain. Must last 30 days    Dispense:  120 tablet    Refill:  0    Chronic Pain: STOP Act (Not applicable) Fill 1 day early if closed on refill date. Do not fill until: 02/25/2019. To last until: 03/27/2019. Avoid benzodiazepines within 8 hours of opioids  . oxyCODONE (OXY IR/ROXICODONE) 5 MG immediate  release tablet    Sig: Take 1 tablet (5 mg total) by mouth every 6 (six) hours as needed for severe pain. Must last 30 days    Dispense:  120 tablet    Refill:  0    Chronic Pain: STOP Act (Not applicable) Fill 1 day early if closed on refill date. Do not fill until: 03/27/2019. To last until: 04/26/2019. Avoid benzodiazepines within 8 hours of opioids   Orders:  Orders Placed This Encounter  Procedures  . LUMBAR FACET(MEDIAL BRANCH NERVE BLOCK) MBNB    Standing Status:   Future    Standing Expiration Date:   01/30/2019    Scheduling Instructions:     Procedure: Diagnostic bilateral lumbar facet block (without steroids) #2     Side: Bilateral     Level:  L3-4, L4-5, & L5-S1 Facets (L2, L3, L4, L5, & S1 Medial Branch Nerves)     Sedation: Patient's choice.     Timeframe: ASAA    Order Specific Question:   Where will this procedure be performed?    Answer:   [facility]  . LUMBAR FACET(MEDIAL BRANCH NERVE BLOCK) MBNB    Standing Status:   Future    Standing Expiration Date:   01/30/2019    Scheduling Instructions:     Procedure: Diagnostic bilateral lumbar facet block (with local anesthetic/no steroids) #2     Side: Bilateral     Level: L3-4, L4-5, & L5-S1 Facets (L2, L3, L4, L5, & S1 Medial Branch Nerves)     Sedation: With Sedation.     Timeframe: ASAA    Order Specific Question:   Where will this procedure be performed?    Answer:   [facility]   Follow-up plan:   Return in about 4 months (around 04/23/2019) for (VV), (MM), in addition, Procedure (w/ sedation): (B) L-FCT BLK #2 (NO STEROIDS), (ASAP).      Interventional management options: Considering:   NOTE: SHELLFISH ALLERGY; Dexamethasone & Prednisone allergy Palliativebilateral suprascapular nerve block(without steroids) #3 Possiblerightsuprascapular nerve RFA #1   Palliative PRN treatment(s):   Diagnostic bilateral lumbar facet block #2  Palliativebilateral suprascapular NB #3(No Steroids) Palliative leftsuprascapular nerve RFA #2(last done on 01/04/2017)    Recent Visits Date Type Provider Dept  12/05/18 Procedure visit Milinda Pointer, MD [facility]  11/12/18 Procedure visit Milinda Pointer, MD [facility]  Showing recent visits within past 90 days and meeting all other requirements   Today's Visits Date Type Provider Dept  12/30/18 Office Visit Milinda Pointer, MD [facility]  Showing today's visits and meeting all other requirements   Future Appointments No visits were found meeting these conditions.  Showing future appointments within next 90 days and meeting all other requirements   I  discussed the assessment and treatment plan with the patient. The patient was provided an opportunity to ask questions and all were answered. The patient agreed with the plan and demonstrated an understanding of the instructions.  Patient advised to call back or seek an in-person evaluation if the symptoms or condition worsens.  Total duration of non-face-to-face encounter: 14 minutes.  Note by: Gaspar Cola, MD Date: 12/30/2018; Time: 1:51 PM  Note: This dictation was prepared with Dragon dictation. Any transcriptional errors that may result from this process are unintentional.  Disclaimer:  * Given the special circumstances of the COVID-19 pandemic, the federal government has announced that the Office for Civil Rights (OCR) will exercise its enforcement discretion and will not impose  penalties on physicians using telehealth in the event of noncompliance with regulatory requirements under the [place] and Accountability Act (HIPAA) in connection with the good faith provision of telehealth during the OHFGB-02 national public health emergency. (AMA)

## 2018-12-30 ENCOUNTER — Ambulatory Visit: Payer: Medicare Other | Attending: Pain Medicine | Admitting: Pain Medicine

## 2018-12-30 ENCOUNTER — Ambulatory Visit: Payer: Self-pay | Admitting: *Deleted

## 2018-12-30 ENCOUNTER — Other Ambulatory Visit: Payer: Self-pay

## 2018-12-30 DIAGNOSIS — M25511 Pain in right shoulder: Secondary | ICD-10-CM | POA: Diagnosis not present

## 2018-12-30 DIAGNOSIS — M25551 Pain in right hip: Secondary | ICD-10-CM

## 2018-12-30 DIAGNOSIS — M47816 Spondylosis without myelopathy or radiculopathy, lumbar region: Secondary | ICD-10-CM | POA: Diagnosis not present

## 2018-12-30 DIAGNOSIS — G8929 Other chronic pain: Secondary | ICD-10-CM

## 2018-12-30 DIAGNOSIS — M25552 Pain in left hip: Secondary | ICD-10-CM

## 2018-12-30 DIAGNOSIS — M545 Low back pain: Secondary | ICD-10-CM

## 2018-12-30 DIAGNOSIS — M25512 Pain in left shoulder: Secondary | ICD-10-CM

## 2018-12-30 DIAGNOSIS — G894 Chronic pain syndrome: Secondary | ICD-10-CM | POA: Diagnosis not present

## 2018-12-30 MED ORDER — OXYCODONE HCL 5 MG PO TABS: 5.0000 mg | ORAL_TABLET | Freq: Four times a day (QID) | ORAL | 0 refills | Status: DC | PRN

## 2018-12-30 NOTE — Patient Instructions (Signed)

## 2018-12-30 NOTE — Chronic Care Management (AMB) (Signed)
  Chronic Care Management   Social Work Note  12/30/2018 Name: [name] MRN: [id number] DOB: [birth year]  [name] is a 58 y.o. year old female who sees Bacigalupo, Dionne Bucy, MD for primary care. The CCM team was consulted for assistance with [employer] , Mental Health Counseling and Resources and Financial Difficulties related to inability to food insecurity and difficulty affording medications to treat her depression. CCM pharmacist informed of referral for medication management as well.  This social worker spoke to patient today by phone, however per patient, she was in the car with her grandchildren and was not able to talk. Patient requested a cal back tomorrow afternoon.   Outpatient Encounter Medications as of 12/30/2018  Medication Sig  . acetaminophen (TYLENOL) 500 MG tablet Take 500 mg by mouth every 6 (six) hours as needed.  Marland Kitchen albuterol (PROVENTIL HFA) 108 (90 Base) MCG/ACT inhaler Inhale 1-2 puffs into the lungs every 4 (four) hours as needed.   . ALPRAZolam (XANAX) 1 MG tablet Take 1 mg by mouth at bedtime as needed for anxiety.  . calcium carbonate (TUMS - DOSED IN MG ELEMENTAL CALCIUM) 500 MG chewable tablet Chew 1 tablet by mouth 3 (three) times daily with meals. As needed  . dexlansoprazole (DEXILANT) 60 MG capsule Take 1 capsule (60 mg total) by mouth daily. **PLEASE SCHEDULE FOLLOW UP APPT**  . Docusate Sodium (COLACE PO) Take by mouth daily.   . Fluticasone-Salmeterol (ADVAIR) 500-50 MCG/DOSE AEPB Inhale 1 puff into the lungs 2 (two) times daily. As needed  . metoprolol succinate (TOPROL-XL) 50 MG 24 hr tablet TAKE 1 TABLET BY MOUTH EVERY DAY  . montelukast (SINGULAIR) 10 MG tablet TAKE 1 TABLET (10 MG TOTAL) BY MOUTH DAILY.  Marland Kitchen ondansetron (ZOFRAN) 4 MG tablet TAKE 1 TABLET BY MOUTH EVERY 8 HOURS AS NEEDED FOR NAUSEA AND VOMITING  . oxyCODONE (OXY IR/ROXICODONE) 5 MG immediate release tablet Take 1 tablet (5 mg total) by mouth every 6 (six) hours as  needed for severe pain. Must last 30 days  . [START ON 01/26/2019] oxyCODONE (OXY IR/ROXICODONE) 5 MG immediate release tablet Take 1 tablet (5 mg total) by mouth every 6 (six) hours as needed for severe pain. Must last 30 days  . Spacer/Aero-Holding Chambers (AEROCHAMBER MV) inhaler by Does not apply route.  . valACYclovir (VALTREX) 1000 MG tablet TAKE TWO TABLETS BY MOUTH TWICE DAILY FOR ONE DAY FEVER BLISTER   No facility-administered encounter medications on file as of 12/30/2018.     Goals Addressed   None     Follow Up Plan: Appointment scheduled for SW follow up with client by phone on: 12/31/18 at [place], [place] Worker  [place] [facility] [phone]

## 2018-12-31 ENCOUNTER — Ambulatory Visit (INDEPENDENT_AMBULATORY_CARE_PROVIDER_SITE_OTHER): Payer: Medicare Other | Admitting: *Deleted

## 2018-12-31 ENCOUNTER — Encounter: Payer: Self-pay | Admitting: *Deleted

## 2018-12-31 DIAGNOSIS — Z598 Other problems related to housing and economic circumstances: Secondary | ICD-10-CM

## 2018-12-31 DIAGNOSIS — Z599 Problem related to housing and economic circumstances, unspecified: Secondary | ICD-10-CM

## 2018-12-31 DIAGNOSIS — F331 Major depressive disorder, recurrent, moderate: Secondary | ICD-10-CM

## 2018-12-31 NOTE — Patient Instructions (Signed)
Thank you allowing the Chronic Care Management Team to be a part of your care! It was a pleasure speaking with you today!  1. Please call this social worker with any questions or concerns regarding your mental health/community resource needs  CCM (Chronic Care Management) Team   Trish Fountain RN, BSN Nurse Care Coordinator  [phone]  Ruben Reason PharmD  Clinical Pharmacist  [phone]   Elliot Gurney, LCSW Clinical Social Worker [phone]  Goals Addressed            This Visit's Progress   . "I am dealing with so much stress with my son and grandchildren" (pt-stated)       Current Barriers:  . Chronic Mental Health needs related to anxiety and depression . Mental Health Concerns  . Family and relationship dysfunction . Lacks knowledge of community resource: community resources to assist with financial difficulties . Suicidal Ideation/Homicidal Ideation: No  Clinical Social Work Goal(s):  Marland Kitchen Over the next  90 days, patient will work with SW bi-weekly by telephone or in person to reduce or manage symptoms of anxiety and depression related to her parenting responsibilities of her 3 grandchildren and dysfunctional relationship with her son   Interventions: . Patient interviewed and appropriate assessments performed: brief mental health assessment, PHQ 2, and PHQ 9 . Patient interviewed and appropriate assessments performed . Provided supportive counseling and emotional support related to conflicts with her adult son and the daily responsibilities of raising her 3 granchildren . Patient's financial difficulties discussed related to household bills and affording medications, possible community resources to assist with patient's financial needs explored . Advised patient to contact granddaughters school to discuss challenges with virtual learning  Patient Self Care Activities:  . Performs ADL's independently . Performs IADL's independently . Ability for  insight  Patient Coping Strengths:  . Family . Able to Communicate Effectively  Patient Self Care Deficits:  . Difficulties in establishing and maintaining boundaries with her adult son  Initial goal documentation         The patient verbalized understanding of instructions provided today and declined a print copy of patient instruction materials.   Telephone follow up appointment with care management team member scheduled for:01/03/19

## 2018-12-31 NOTE — Chronic Care Management (AMB) (Signed)
Chronic Care Management    Clinical Social Work General Note  12/31/2018 Name: [name] MRN: [id number] DOB: 08/25/1960  [name] is a 58 y.o. year old female who is a primary care patient of Bacigalupo, Dionne Bucy, MD. The CCM was consulted to assist the patient with Mental Health Counseling and Resources.   [name] was given information about Chronic Care Management services today including:  1. CCM service includes personalized support from designated clinical staff supervised by her physician, including individualized plan of care and coordination with other care providers 2. 24/7 contact phone numbers for assistance for urgent and routine care needs. 3. Service will only be billed when office clinical staff spend 20 minutes or more in a month to coordinate care. 4. Only one practitioner may furnish and bill the service in a calendar month. 5. The patient may stop CCM services at any time (effective at the end of the month) by phone call to the office staff. 6. The patient will be responsible for cost sharing (co-pay) of up to 20% of the service fee (after annual deductible is met).  Patient agreed to services and verbal consent obtained.   Review of patient status, including review of consultants reports, relevant laboratory and other test results, and collaboration with appropriate care team members and the patient's provider was performed as part of comprehensive patient evaluation and provision of chronic care management services.    SDOH (Social Determinants of Health) screening performed today. See Care Plan Entry related to challenges with: Housing  Financial Strain  Food Insecurity  Depression   Stress  Advanced Directives Status: <no information> See Care Plan for related entries.   Outpatient Encounter Medications as of 12/31/2018  Medication Sig  . acetaminophen (TYLENOL) 500 MG tablet Take 500 mg by mouth every 6 (six) hours as needed.  Marland Kitchen albuterol  (PROVENTIL HFA) 108 (90 Base) MCG/ACT inhaler Inhale 1-2 puffs into the lungs every 4 (four) hours as needed.   . ALPRAZolam (XANAX) 1 MG tablet Take 1 mg by mouth at bedtime as needed for anxiety.  . calcium carbonate (TUMS - DOSED IN MG ELEMENTAL CALCIUM) 500 MG chewable tablet Chew 1 tablet by mouth 3 (three) times daily with meals. As needed  . dexlansoprazole (DEXILANT) 60 MG capsule Take 1 capsule (60 mg total) by mouth daily. **PLEASE SCHEDULE FOLLOW UP APPT**  . Docusate Sodium (COLACE PO) Take by mouth daily.   . Fluticasone-Salmeterol (ADVAIR) 500-50 MCG/DOSE AEPB Inhale 1 puff into the lungs 2 (two) times daily. As needed  . metoprolol succinate (TOPROL-XL) 50 MG 24 hr tablet TAKE 1 TABLET BY MOUTH EVERY DAY  . montelukast (SINGULAIR) 10 MG tablet TAKE 1 TABLET (10 MG TOTAL) BY MOUTH DAILY.  Marland Kitchen ondansetron (ZOFRAN) 4 MG tablet TAKE 1 TABLET BY MOUTH EVERY 8 HOURS AS NEEDED FOR NAUSEA AND VOMITING  . [START ON 01/26/2019] oxyCODONE (OXY IR/ROXICODONE) 5 MG immediate release tablet Take 1 tablet (5 mg total) by mouth every 6 (six) hours as needed for severe pain. Must last 30 days  . [START ON 02/25/2019] oxyCODONE (OXY IR/ROXICODONE) 5 MG immediate release tablet Take 1 tablet (5 mg total) by mouth every 6 (six) hours as needed for severe pain. Must last 30 days  . [START ON 03/27/2019] oxyCODONE (OXY IR/ROXICODONE) 5 MG immediate release tablet Take 1 tablet (5 mg total) by mouth every 6 (six) hours as needed for severe pain. Must last 30 days  . Spacer/Aero-Holding Chambers (AEROCHAMBER MV) inhaler  by Does not apply route.  . valACYclovir (VALTREX) 1000 MG tablet TAKE TWO TABLETS BY MOUTH TWICE DAILY FOR ONE DAY FEVER BLISTER   No facility-administered encounter medications on file as of 12/31/2018.     Goals Addressed            This Visit's Progress   . "I am dealing with so much stress with my son and grandchildren" (pt-stated)       Current Barriers:  . Chronic Mental Health  needs related to anxiety and depression . Mental Health Concerns  . Family and relationship dysfunction . Lacks knowledge of community resource: community resources to assist with financial difficulties . Suicidal Ideation/Homicidal Ideation: No  Clinical Social Work Goal(s):  Marland Kitchen Over the next  90 days, patient will work with SW bi-weekly by telephone or in person to reduce or manage symptoms of anxiety and depression related to her parenting responsibilities of her 3 grandchildren and dysfunctional relationship with her son   Interventions: . Patient interviewed and appropriate assessments performed: brief mental health assessment, PHQ 2, and PHQ 9 . Patient interviewed and appropriate assessments performed . Provided supportive counseling and emotional support related to conflicts with her adult son and the daily responsibilities of raising her 3 granchildren . Patient's financial difficulties discussed related to household bills and affording medications, possible community resources to assist with patient's financial needs explored . Advised patient to contact granddaughters school to discuss challenges with virtual learning  Patient Self Care Activities:  . Performs ADL's independently . Performs IADL's independently . Ability for insight  Patient Coping Strengths:  . Family . Able to Communicate Effectively  Patient Self Care Deficits:  . Difficulties in establishing and maintaining boundaries with her adult son  Initial goal documentation         Follow Up Plan: SW will follow up with patient by phone over the next 2 weeks        [place], [place] Worker  [facility] [phone]

## 2019-01-03 ENCOUNTER — Ambulatory Visit: Payer: Self-pay | Admitting: *Deleted

## 2019-01-03 DIAGNOSIS — F331 Major depressive disorder, recurrent, moderate: Secondary | ICD-10-CM | POA: Diagnosis not present

## 2019-01-03 DIAGNOSIS — Z598 Other problems related to housing and economic circumstances: Secondary | ICD-10-CM

## 2019-01-03 DIAGNOSIS — Z599 Problem related to housing and economic circumstances, unspecified: Secondary | ICD-10-CM

## 2019-01-05 NOTE — Chronic Care Management (AMB) (Signed)
Chronic Care Management    Clinical Social Work Follow Up Note  01/05/2019 Name: [name] MRN: [id number] DOB: 30-Apr-1960  [name] is a 58 y.o. year old female who is a primary care patient of Bacigalupo, Dionne Bucy, MD. The CCM team was consulted for assistance with Mental Health Counseling and Resources.   Review of patient status, including review of consultants reports, other relevant assessments, and collaboration with appropriate care team members and the patient's provider was performed as part of comprehensive patient evaluation and provision of chronic care management services.    Advanced Directives Status: <no information> See Care Plan for related entries.   Outpatient Encounter Medications as of 01/03/2019  Medication Sig  . acetaminophen (TYLENOL) 500 MG tablet Take 500 mg by mouth every 6 (six) hours as needed.  Marland Kitchen albuterol (PROVENTIL HFA) 108 (90 Base) MCG/ACT inhaler Inhale 1-2 puffs into the lungs every 4 (four) hours as needed.   . ALPRAZolam (XANAX) 1 MG tablet Take 1 mg by mouth at bedtime as needed for anxiety.  . calcium carbonate (TUMS - DOSED IN MG ELEMENTAL CALCIUM) 500 MG chewable tablet Chew 1 tablet by mouth 3 (three) times daily with meals. As needed  . dexlansoprazole (DEXILANT) 60 MG capsule Take 1 capsule (60 mg total) by mouth daily. **PLEASE SCHEDULE FOLLOW UP APPT**  . Docusate Sodium (COLACE PO) Take by mouth daily.   . Fluticasone-Salmeterol (ADVAIR) 500-50 MCG/DOSE AEPB Inhale 1 puff into the lungs 2 (two) times daily. As needed  . metoprolol succinate (TOPROL-XL) 50 MG 24 hr tablet TAKE 1 TABLET BY MOUTH EVERY DAY  . montelukast (SINGULAIR) 10 MG tablet TAKE 1 TABLET (10 MG TOTAL) BY MOUTH DAILY.  Marland Kitchen ondansetron (ZOFRAN) 4 MG tablet TAKE 1 TABLET BY MOUTH EVERY 8 HOURS AS NEEDED FOR NAUSEA AND VOMITING  . [START ON 01/26/2019] oxyCODONE (OXY IR/ROXICODONE) 5 MG immediate release tablet Take 1 tablet (5 mg total) by mouth every 6 (six)  hours as needed for severe pain. Must last 30 days  . [START ON 02/25/2019] oxyCODONE (OXY IR/ROXICODONE) 5 MG immediate release tablet Take 1 tablet (5 mg total) by mouth every 6 (six) hours as needed for severe pain. Must last 30 days  . [START ON 03/27/2019] oxyCODONE (OXY IR/ROXICODONE) 5 MG immediate release tablet Take 1 tablet (5 mg total) by mouth every 6 (six) hours as needed for severe pain. Must last 30 days  . Spacer/Aero-Holding Chambers (AEROCHAMBER MV) inhaler by Does not apply route.  . valACYclovir (VALTREX) 1000 MG tablet TAKE TWO TABLETS BY MOUTH TWICE DAILY FOR ONE DAY FEVER BLISTER   No facility-administered encounter medications on file as of 01/03/2019.      Goals Addressed            This Visit's Progress   . "I am dealing with so much stress with my son and grandchildren" (pt-stated)       Current Barriers:  . Chronic Mental Health needs related to anxiety and depression . Mental Health Concerns  . Family and relationship dysfunction . Lacks knowledge of community resource: community resources to assist with financial difficulties . Suicidal Ideation/Homicidal Ideation: No  Clinical Social Work Goal(s):  Marland Kitchen Over the next  90 days, patient will work with SW bi-weekly by telephone or in person to reduce or manage symptoms of anxiety and depression related to her parenting responsibilities of her 3 grandchildren and dysfunctional relationship with her son   Interventions:Patient interviewed and appropriate assessments performed:  .  Continued to provide supportive counseling and emotional support related to conflicts with her adult son and the daily responsibilities of raising her 3 granchildren . Confirmed that contact has made with the school to discuss challenges with virtual learning . Patient's financial difficulties discussed related to household bills and affording medications, possible community resources to assist with patient's financial needs explored .  Confirmed request made through the [facility], response pending . Collaboration phone call made to the [employer] confirming that they have funds available, required face to face appointments will be scheduled beginning next week Thursday . Discussed considering referral to the [facility] [employer]  Patient Self Care Activities:  . Performs ADL's independently . Performs IADL's independently . Ability for insight  Patient Coping Strengths:  . Family . Able to Communicate Effectively  Patient Self Care Deficits:  . Difficulties in establishing and maintaining boundaries with her adult son  Please see past updates related to this goal by clicking on the "Past Updates" button in the selected goal          Follow Up Plan: SW will follow up with patient by phone over the next 2 weeks    [place], [place] Worker  [facility] [phone]

## 2019-01-05 NOTE — Patient Instructions (Addendum)
Thank you allowing the Chronic Care Management Team to be a part of your care! It was a pleasure speaking with you today!  1. Please call this social worker with any questions or concerns regarding your mental health or community resource needs.  CCM (Chronic Care Management) Team   Neldon Labella RN, BSN Nurse Care Coordinator  [phone]  Ruben Reason PharmD  Clinical Pharmacist  [phone]   Elliot Gurney, LCSW Clinical Social Worker [phone]  Goals Addressed            This Visit's Progress   . "I am dealing with so much stress with my son and grandchildren" (pt-stated)       Current Barriers:  . Chronic Mental Health needs related to anxiety and depression . Mental Health Concerns  . Family and relationship dysfunction . Lacks knowledge of community resource: community resources to assist with financial difficulties . Suicidal Ideation/Homicidal Ideation: No  Clinical Social Work Goal(s):  Marland Kitchen Over the next  90 days, patient will work with SW bi-weekly by telephone or in person to reduce or manage symptoms of anxiety and depression related to her parenting responsibilities of her 3 grandchildren and dysfunctional relationship with her son   Interventions:Patient interviewed and appropriate assessments performed:  . Continued to provide supportive counseling and emotional support related to conflicts with her adult son and the daily responsibilities of raising her 3 granchildren . Confirmed that contact has made with the school to discuss challenges with virtual learning . Patient's financial difficulties discussed related to household bills and affording medications, possible community resources to assist with patient's financial needs explored . Confirmed request made through the [facility], response pending . Collaboration phone call made to the [employer] confirming that they have funds available, required face to face appointments  will be scheduled beginning next week Thursday . Discussed considering referral to the [facility] [employer]  Patient Self Care Activities:  . Performs ADL's independently . Performs IADL's independently . Ability for insight  Patient Coping Strengths:  . Family . Able to Communicate Effectively  Patient Self Care Deficits:  . Difficulties in establishing and maintaining boundaries with her adult son  Please see past updates related to this goal by clicking on the "Past Updates" button in the selected goal          The patient verbalized understanding of instructions provided today and declined a print copy of patient instruction materials.   Telephone follow up appointment with care management team member scheduled for:01/17/19

## 2019-01-09 ENCOUNTER — Telehealth: Payer: Self-pay

## 2019-01-09 NOTE — Telephone Encounter (Signed)
01/09/2019  Attempted  to speak with patient about community resources. Patient stated that she was fine and did not need anything at this time. Encouraged patient to please call if she needs financial assistance in the future. Ambrose Mantle [phone]

## 2019-01-13 ENCOUNTER — Encounter: Payer: Self-pay | Admitting: *Deleted

## 2019-01-13 ENCOUNTER — Ambulatory Visit: Payer: Self-pay | Admitting: *Deleted

## 2019-01-13 NOTE — Chronic Care Management (AMB) (Signed)
  Chronic Care Management   Social Work Note  01/13/2019 Name: [name] MRN: [id number] DOB: 1960-06-10  [name] is a 58 y.o. year old female who sees Bacigalupo, Dionne Bucy, MD for primary care. The CCM team was consulted for assistance with Mental Health Counseling and Resources.   Phone call to patient to inform her of approval for financial assistance from the [facility] to pay a past due Warehouse manager. Patient's phone was disconnected as well as the additional number listed. This social worker to continue to make efforts to contact patient to provide assistance.   Outpatient Encounter Medications as of 01/13/2019  Medication Sig  . acetaminophen (TYLENOL) 500 MG tablet Take 500 mg by mouth every 6 (six) hours as needed.  Marland Kitchen albuterol (PROVENTIL HFA) 108 (90 Base) MCG/ACT inhaler Inhale 1-2 puffs into the lungs every 4 (four) hours as needed.   . ALPRAZolam (XANAX) 1 MG tablet Take 1 mg by mouth at bedtime as needed for anxiety.  . calcium carbonate (TUMS - DOSED IN MG ELEMENTAL CALCIUM) 500 MG chewable tablet Chew 1 tablet by mouth 3 (three) times daily with meals. As needed  . dexlansoprazole (DEXILANT) 60 MG capsule Take 1 capsule (60 mg total) by mouth daily. **PLEASE SCHEDULE FOLLOW UP APPT**  . Docusate Sodium (COLACE PO) Take by mouth daily.   . Fluticasone-Salmeterol (ADVAIR) 500-50 MCG/DOSE AEPB Inhale 1 puff into the lungs 2 (two) times daily. As needed  . metoprolol succinate (TOPROL-XL) 50 MG 24 hr tablet TAKE 1 TABLET BY MOUTH EVERY DAY  . montelukast (SINGULAIR) 10 MG tablet TAKE 1 TABLET (10 MG TOTAL) BY MOUTH DAILY.  Marland Kitchen ondansetron (ZOFRAN) 4 MG tablet TAKE 1 TABLET BY MOUTH EVERY 8 HOURS AS NEEDED FOR NAUSEA AND VOMITING  . [START ON 01/26/2019] oxyCODONE (OXY IR/ROXICODONE) 5 MG immediate release tablet Take 1 tablet (5 mg total) by mouth every 6 (six) hours as needed for severe pain. Must last 30 days  . [START ON 02/25/2019] oxyCODONE (OXY  IR/ROXICODONE) 5 MG immediate release tablet Take 1 tablet (5 mg total) by mouth every 6 (six) hours as needed for severe pain. Must last 30 days  . [START ON 03/27/2019] oxyCODONE (OXY IR/ROXICODONE) 5 MG immediate release tablet Take 1 tablet (5 mg total) by mouth every 6 (six) hours as needed for severe pain. Must last 30 days  . Spacer/Aero-Holding Chambers (AEROCHAMBER MV) inhaler by Does not apply route.  . valACYclovir (VALTREX) 1000 MG tablet TAKE TWO TABLETS BY MOUTH TWICE DAILY FOR ONE DAY FEVER BLISTER   No facility-administered encounter medications on file as of 01/13/2019.     Goals Addressed   None     Follow Up Plan: SW will follow up with patient by phone over the next 2 weeks   [place] Trompeter, [place] Worker  [facility] [phone]

## 2019-01-13 NOTE — Progress Notes (Signed)
This encounter was created in error - please disregard.

## 2019-01-14 ENCOUNTER — Other Ambulatory Visit: Payer: Self-pay

## 2019-01-14 ENCOUNTER — Ambulatory Visit
Admission: RE | Admit: 2019-01-14 | Discharge: 2019-01-14 | Disposition: A | Discharge status: Home / Self Care | Payer: Medicare Other | Source: Ambulatory Visit | Attending: Pain Medicine | Admitting: Pain Medicine

## 2019-01-14 ENCOUNTER — Ambulatory Visit (HOSPITAL_BASED_OUTPATIENT_CLINIC_OR_DEPARTMENT_OTHER): Payer: Medicare Other | Admitting: Pain Medicine

## 2019-01-14 ENCOUNTER — Ambulatory Visit: Payer: Self-pay | Admitting: *Deleted

## 2019-01-14 ENCOUNTER — Encounter: Payer: Self-pay | Admitting: Pain Medicine

## 2019-01-14 VITALS — BP 151/84 | HR 62 | Temp 97.6°F | Resp 16 | Ht 63.0 in | Wt 155.0 lb

## 2019-01-14 DIAGNOSIS — M545 Low back pain, unspecified: Secondary | ICD-10-CM

## 2019-01-14 DIAGNOSIS — M47817 Spondylosis without myelopathy or radiculopathy, lumbosacral region: Secondary | ICD-10-CM

## 2019-01-14 DIAGNOSIS — M51379 Other intervertebral disc degeneration, lumbosacral region without mention of lumbar back pain or lower extremity pain: Secondary | ICD-10-CM

## 2019-01-14 DIAGNOSIS — Z91013 Allergy to seafood: Secondary | ICD-10-CM | POA: Diagnosis not present

## 2019-01-14 DIAGNOSIS — M7918 Myalgia, other site: Secondary | ICD-10-CM | POA: Diagnosis not present

## 2019-01-14 DIAGNOSIS — G8929 Other chronic pain: Secondary | ICD-10-CM | POA: Insufficient documentation

## 2019-01-14 DIAGNOSIS — Z91041 Radiographic dye allergy status: Secondary | ICD-10-CM | POA: Diagnosis not present

## 2019-01-14 DIAGNOSIS — M961 Postlaminectomy syndrome, not elsewhere classified: Secondary | ICD-10-CM | POA: Insufficient documentation

## 2019-01-14 DIAGNOSIS — M5137 Other intervertebral disc degeneration, lumbosacral region: Secondary | ICD-10-CM

## 2019-01-14 DIAGNOSIS — M47816 Spondylosis without myelopathy or radiculopathy, lumbar region: Secondary | ICD-10-CM | POA: Insufficient documentation

## 2019-01-14 MED ORDER — MIDAZOLAM HCL 5 MG/5ML IJ SOLN
INTRAMUSCULAR | Status: AC
  Filled 2019-01-14: qty 5

## 2019-01-14 MED ORDER — LIDOCAINE HCL 2 % IJ SOLN
INTRAMUSCULAR | Status: AC
  Filled 2019-01-14: qty 20

## 2019-01-14 MED ORDER — ROPIVACAINE HCL 2 MG/ML IJ SOLN
INTRAMUSCULAR | Status: AC
  Filled 2019-01-14: qty 20

## 2019-01-14 MED ORDER — MIDAZOLAM HCL 5 MG/5ML IJ SOLN
1.0000 mg | INTRAMUSCULAR | Status: DC | PRN
  Administered 2019-01-14: 4 mg via INTRAVENOUS

## 2019-01-14 MED ORDER — FENTANYL CITRATE (PF) 100 MCG/2ML IJ SOLN
25.0000 ug | INTRAMUSCULAR | Status: DC | PRN
  Administered 2019-01-14: 50 ug via INTRAVENOUS

## 2019-01-14 MED ORDER — CYCLOBENZAPRINE HCL 10 MG PO TABS: 10.0000 mg | ORAL_TABLET | Freq: Three times a day (TID) | ORAL | 0 refills | Status: DC | PRN

## 2019-01-14 MED ORDER — LACTATED RINGERS IV SOLN
1000.0000 mL | Freq: Once | INTRAVENOUS | Status: AC
  Administered 2019-01-14: 1000 mL via INTRAVENOUS

## 2019-01-14 MED ORDER — ROPIVACAINE HCL 2 MG/ML IJ SOLN
18.0000 mL | Freq: Once | INTRAMUSCULAR | Status: AC
  Administered 2019-01-14: 10:00:00 18 mL via PERINEURAL

## 2019-01-14 MED ORDER — FENTANYL CITRATE (PF) 100 MCG/2ML IJ SOLN
INTRAMUSCULAR | Status: AC
  Filled 2019-01-14: qty 2

## 2019-01-14 MED ORDER — LIDOCAINE HCL 2 % IJ SOLN
20.0000 mL | Freq: Once | INTRAMUSCULAR | Status: AC
  Administered 2019-01-14: 400 mg

## 2019-01-14 NOTE — Patient Instructions (Signed)
____________________________________________________________________________________________  Post-Procedure Discharge Instructions  Instructions:  Apply ice:   Purpose: This will minimize any swelling and discomfort after procedure.   When: Day of procedure, as soon as you get home.  How: Fill a plastic sandwich bag with crushed ice. Cover it with a small towel and apply to injection site.  How long: (15 min on, 15 min off) Apply for 15 minutes then remove x 15 minutes.  Repeat sequence on day of procedure, until you go to bed.  Apply heat:   Purpose: To treat any soreness and discomfort from the procedure.  When: Starting the next day after the procedure.  How: Apply heat to procedure site starting the day following the procedure.  How long: May continue to repeat daily, until discomfort goes away.  Food intake: Start with clear liquids (like water) and advance to regular food, as tolerated.   Physical activities: Keep activities to a minimum for the first 8 hours after the procedure. After that, then as tolerated.  Driving: If you have received any sedation, be responsible and do not drive. You are not allowed to drive for 24 hours after having sedation.  Blood thinner: (Applies only to those taking blood thinners) You may restart your blood thinner 6 hours after your procedure.  Insulin: (Applies only to Diabetic patients taking insulin) As soon as you can eat, you may resume your normal dosing schedule.  Infection prevention: Keep procedure site clean and dry. Shower daily and clean area with soap and water.  Post-procedure Pain Diary: Extremely important that this be done correctly and accurately. Recorded information will be used to determine the next step in treatment. For the purpose of accuracy, follow these rules:  Evaluate only the area treated. Do not report or include pain from an untreated area. For the purpose of this evaluation, ignore all other areas of pain,  except for the treated area.  After your procedure, avoid taking a long nap and attempting to complete the pain diary after you wake up. Instead, set your alarm clock to go off every hour, on the hour, for the initial 8 hours after the procedure. Document the duration of the numbing medicine, and the relief you are getting from it.  Do not go to sleep and attempt to complete it later. It will not be accurate. If you received sedation, it is likely that you were given a medication that may cause amnesia. Because of this, completing the diary at a later time may cause the information to be inaccurate. This information is needed to plan your care.  Follow-up appointment: Keep your post-procedure follow-up evaluation appointment after the procedure (usually 2 weeks for most procedures, 6 weeks for radiofrequencies). DO NOT FORGET to bring you pain diary with you.   Expect: (What should I expect to see with my procedure?)  From numbing medicine (AKA: Local Anesthetics): Numbness or decrease in pain. You may also experience some weakness, which if present, could last for the duration of the local anesthetic.  Onset: Full effect within 15 minutes of injected.  Duration: It will depend on the type of local anesthetic used. On the average, 1 to 8 hours.   From steroids (Applies only if steroids were used): Decrease in swelling or inflammation. Once inflammation is improved, relief of the pain will follow. (NONE GIVEN TODAY)  Onset of benefits: Depends on the amount of swelling present. The more swelling, the longer it will take for the benefits to be seen. In some cases, up  to 10 days.  Duration: Steroids will stay in the system x 2 weeks. Duration of benefits will depend on multiple posibilities including persistent irritating factors.  Side-effects: If present, they may typically last 2 weeks (the duration of the steroids).  Frequent: Cramps (if they occur, drink Gatorade and take over-the-counter  Magnesium 450-500 mg once to twice a day); water retention with temporary weight gain; increases in blood sugar; decreased immune system response; increased appetite.  Occasional: Facial flushing (red, warm cheeks); mood swings; menstrual changes.  Uncommon: Long-term decrease or suppression of natural hormones; bone thinning. (These are more common with higher doses or more frequent use. This is why we prefer that our patients avoid having any injection therapies in other practices.)   Very Rare: Severe mood changes; psychosis; aseptic necrosis.  From procedure: Some discomfort is to be expected once the numbing medicine wears off. This should be minimal if ice and heat are applied as instructed.  Call if: (When should I call?)  You experience numbness and weakness that gets worse with time, as opposed to wearing off.  New onset bowel or bladder incontinence. (Applies only to procedures done in the spine)  Emergency Numbers:  Durning business hours (Monday - Thursday, 8:00 AM - 4:00 PM) (Friday, 9:00 AM - 12:00 Noon): [phone]  After hours: [phone]  NOTE: If you are having a problem and are unable connect with, or to talk to a provider, then go to your nearest urgent care or emergency department. If the problem is serious and urgent, please call 911. ____________________________________________________________________________________________

## 2019-01-14 NOTE — Chronic Care Management (AMB) (Signed)
   Chronic Care Management   Unsuccessful Call Note 01/14/2019 Name: [name] MRN: [id number] DOB: 1960/09/26  Patient  is a 58 year old female who sees Dr. Brita Romp for primary care. Dr. Brita Romp asked the CCM team to consult the patient for [place] and community resources.     This social worker was unable to reach patient via telephone today for follow up on community resources to meet her financial needs. I have left HIPAA compliant voicemail asking patient to return my call. (unsuccessful outreach #1).   Plan: Will follow-up within 7 business days via telephone.       Elliot Gurney, LCSW Clinical Social Worker  [facility] [phone] \

## 2019-01-14 NOTE — Progress Notes (Signed)
Patient's Name: [name]  MRN: [id number]  Referring Provider: Virginia Crews, MD  DOB: [birth year]  PCP: Virginia Crews, MD  DOS: 01/14/2019  Note by: Gaspar Cola, MD  Service setting: Ambulatory outpatient  Specialty: Interventional Pain Management  Patient type: Established  Location: [facility] (AMB) Pain Management Facility  Visit type: Interventional Procedure   Primary Reason for Visit: Interventional Pain Management Treatment. CC: Back Pain (lower)  Procedure:          Anesthesia, Analgesia, Anxiolysis:  Type: Lumbar Facet, Medial Branch Block(s) #2 (NO STEROIDS) Primary Purpose: Diagnostic Region: Posterolateral Lumbosacral Spine Level: L2, L3, L4, L5, & S1 Medial Branch Level(s). Injecting these levels blocks the L3-4, L4-5, and L5-S1 lumbar facet joints. Laterality: Bilateral  Type: Moderate (Conscious) Sedation combined with Local Anesthesia Indication(s): Analgesia and Anxiety Route: Intravenous (IV) IV Access: Secured Sedation: Meaningful verbal contact was maintained at all times during the procedure  Local Anesthetic: Lidocaine 1-2%  Position: Prone   Indications: 1. Lumbar facet syndrome (Bilateral) (L>R)   2. Spondylosis without myelopathy or radiculopathy, lumbosacral region   3. Lumbar spondylosis   4. DDD (degenerative disc disease), lumbosacral   5. 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 History of allergy to radiographic contrast media   8. History of allergy to shellfish    Pain Score: Pre-procedure: 4 /10 Post-procedure: 0-No pain/10   Pre-op Assessment:  [name] is a 58 y.o. (year old), female patient, seen today for interventional treatment. She  has a past surgical history that includes RIGHT HIP ARTHROSCOPY FOR LABRAL TEAR (ABOUT 2010); Anterior fusion cervical spine (MAY 2012); Nasal septum surgery (MARCH 2013); Back surgery  (2009); Tonsillectomy; Abdominal hysterectomy; DIAGNOSTIC LAPAROSCOPIES - MULTIPLE FOR ENDOMETRIOSIS; Cholecystectomy; Hip arthroscopy (09/20/2011); Shoulder arthroscopy (05-06-12); Carpal tunnel release (05-06-12); Total hip arthroplasty (Right, 05/13/2012); Anterior cervical decomp/discectomy fusion (N/A, 10/30/2012); Posterior cervical fusion/foraminotomy (N/A, 04/16/2013); Breast biopsy (Right, 2008); Breast excisional biopsy (Left, 1998); Breast reduction surgery (Bilateral, 06/2016); Colonoscopy with propofol (N/A, 01/26/2017); Esophagogastroduodenoscopy (egd) with propofol (N/A, 01/26/2017); Esophageal dilation (N/A, 01/26/2017); polypectomy (N/A, 01/26/2017); PH impedance study (N/A, 05/30/2017); Esophageal manometry (N/A, 05/30/2017); and Radiofrequency ablation nerves. [name] has a current medication list which includes the following prescription(s): acetaminophen, albuterol, alprazolam, calcium carbonate, dexlansoprazole, docusate sodium, fluticasone-salmeterol, metoprolol succinate, montelukast, ondansetron, oxycodone, oxycodone, oxycodone, aerochamber mv, valacyclovir, and cyclobenzaprine, and the following Facility-Administered Medications: fentanyl and midazolam. Her primarily concern today is the Back Pain (lower)  Initial Vital Signs:  Pulse/HCG Rate: 62ECG Heart Rate: 68 Temp: 97.7 F (36.5 C) Resp: 16 BP: (!) 147/84 SpO2: 100 %  BMI: Estimated body mass index is 27.46 kg/m as calculated from the following:   Height as of this encounter: 5\' 3"  (1.6 m).   Weight as of this encounter: 155 lb (70.3 kg).  Risk Assessment: Allergies: Reviewed. She is allergic to amoxicillin; chlorhexidine gluconate; clindamycin; codeine; erythromycin; penicillin g; sulfa antibiotics; levofloxacin; shellfish allergy; decadron [dexamethasone]; mangifera indica; papaya derivatives; betadine [povidone iodine]; chlorhexidine; clarithromycin; clindamycin/lincomycin; other; povidone-iodine; and prednisone.  Allergy  Precautions: None required Coagulopathies: Reviewed. None identified.  Blood-thinner therapy: None at this time Active Infection(s): Reviewed. None identified. [name] is afebrile  Site Confirmation: Ms. Method was asked to confirm the procedure and laterality before marking the site Procedure checklist: Completed Consent: Before the procedure and under the influence of no sedative(s), amnesic(s), or anxiolytics, the patient was informed  of the treatment options, risks and possible complications. To fulfill our ethical and legal obligations, as recommended by the American Medical Association's Code of Ethics, I have informed the patient of my clinical impression; the nature and purpose of the treatment or procedure; the risks, benefits, and possible complications of the intervention; the alternatives, including doing nothing; the risk(s) and benefit(s) of the alternative treatment(s) or procedure(s); and the risk(s) and benefit(s) of doing nothing. The patient was provided information about the general risks and possible complications associated with the procedure. These may include, but are not limited to: failure to achieve desired goals, infection, bleeding, organ or nerve damage, allergic reactions, paralysis, and death. In addition, the patient was informed of those risks and complications associated to Spine-related procedures, such as failure to decrease pain; infection (i.e.: Meningitis, epidural or intraspinal abscess); bleeding (i.e.: epidural hematoma, subarachnoid hemorrhage, or any other type of intraspinal or peri-dural bleeding); organ or nerve damage (i.e.: Any type of peripheral nerve, nerve root, or spinal cord injury) with subsequent damage to sensory, motor, and/or autonomic systems, resulting in permanent pain, numbness, and/or weakness of one or several areas of the body; allergic reactions; (i.e.: anaphylactic reaction); and/or death. Furthermore, the patient was informed of those  risks and complications associated with the medications. These include, but are not limited to: allergic reactions (i.e.: anaphylactic or anaphylactoid reaction(s)); adrenal axis suppression; blood sugar elevation that in diabetics may result in ketoacidosis or comma; water retention that in patients with history of congestive heart failure may result in shortness of breath, pulmonary edema, and decompensation with resultant heart failure; weight gain; swelling or edema; medication-induced neural toxicity; particulate matter embolism and blood vessel occlusion with resultant organ, and/or nervous system infarction; and/or aseptic necrosis of one or more joints. Finally, the patient was informed that Medicine is not an exact science; therefore, there is also the possibility of unforeseen or unpredictable risks and/or possible complications that may result in a catastrophic outcome. The patient indicated having understood very clearly. We have given the patient no guarantees and we have made no promises. Enough time was given to the patient to ask questions, all of which were answered to the patient's satisfaction. Ms. Mellema has indicated that she wanted to continue with the procedure. Attestation: I, the ordering provider, attest that I have discussed with the patient the benefits, risks, side-effects, alternatives, likelihood of achieving goals, and potential problems during recovery for the procedure that I have provided informed consent. Date   Time: 01/14/2019  9:01 AM  Pre-Procedure Preparation:  Monitoring: As per clinic protocol. Respiration, ETCO2, SpO2, BP, heart rate and rhythm monitor placed and checked for adequate function Safety Precautions: Patient was assessed for positional comfort and pressure points before starting the procedure. Time-out: I initiated and conducted the "Time-out" before starting the procedure, as per protocol. The patient was asked to participate by confirming the accuracy  of the "Time Out" information. Verification of the correct person, site, and procedure were performed and confirmed by me, the nursing staff, and the patient. "Time-out" conducted as per Joint Commission's Universal Protocol (UP.01.01.01). Time: 0937  Description of Procedure:          Laterality: Bilateral. The procedure was performed in identical fashion on both sides. Levels:  L2, L3, L4, L5, & S1 Medial Branch Level(s) Area Prepped: Posterior Lumbosacral Region Prepping solution: DuraPrep (Iodine Povacrylex [0.7% available iodine] and Isopropyl Alcohol, 74% w/w) Safety Precautions: Aspiration looking for blood return was conducted prior to all injections. At  no point did we inject any substances, as a needle was being advanced. Before injecting, the patient was told to immediately notify me if she was experiencing any new onset of "ringing in the ears, or metallic taste in the mouth". No attempts were made at seeking any paresthesias. Safe injection practices and needle disposal techniques used. Medications properly checked for expiration dates. SDV (single dose vial) medications used. After the completion of the procedure, all disposable equipment used was discarded in the proper designated medical waste containers. Local Anesthesia: Protocol guidelines were followed. The patient was positioned over the fluoroscopy table. The area was prepped in the usual manner. The time-out was completed. The target area was identified using fluoroscopy. A 12-in long, straight, sterile hemostat was used with fluoroscopic guidance to locate the targets for each level blocked. Once located, the skin was marked with an approved surgical skin marker. Once all sites were marked, the skin (epidermis, dermis, and hypodermis), as well as deeper tissues (fat, connective tissue and muscle) were infiltrated with a small amount of a short-acting local anesthetic, loaded on a 10cc syringe with a 25G, 1.5-in  Needle. An  appropriate amount of time was allowed for local anesthetics to take effect before proceeding to the next step. Local Anesthetic: Lidocaine 2.0% The unused portion of the local anesthetic was discarded in the proper designated containers. Technical explanation of process:  L2 Medial Branch Nerve Block (MBB): The target area for the L2 medial branch is at the junction of the postero-lateral aspect of the superior articular process and the superior, posterior, and medial edge of the transverse process of L3. Under fluoroscopic guidance, a Quincke needle was inserted until contact was made with os over the superior postero-lateral aspect of the pedicular shadow (target area). After negative aspiration for blood, 0.5 mL of the nerve block solution was injected without difficulty or complication. The needle was removed intact. L3 Medial Branch Nerve Block (MBB): The target area for the L3 medial branch is at the junction of the postero-lateral aspect of the superior articular process and the superior, posterior, and medial edge of the transverse process of L4. Under fluoroscopic guidance, a Quincke needle was inserted until contact was made with os over the superior postero-lateral aspect of the pedicular shadow (target area). After negative aspiration for blood, 0.5 mL of the nerve block solution was injected without difficulty or complication. The needle was removed intact. L4 Medial Branch Nerve Block (MBB): The target area for the L4 medial branch is at the junction of the postero-lateral aspect of the superior articular process and the superior, posterior, and medial edge of the transverse process of L5. Under fluoroscopic guidance, a Quincke needle was inserted until contact was made with os over the superior postero-lateral aspect of the pedicular shadow (target area). After negative aspiration for blood, 0.5 mL of the nerve block solution was injected without difficulty or complication. The needle was  removed intact. L5 Medial Branch Nerve Block (MBB): The target area for the L5 medial branch is at the junction of the postero-lateral aspect of the superior articular process and the superior, posterior, and medial edge of the sacral ala. Under fluoroscopic guidance, a Quincke needle was inserted until contact was made with os over the superior postero-lateral aspect of the pedicular shadow (target area). After negative aspiration for blood, 0.5 mL of the nerve block solution was injected without difficulty or complication. The needle was removed intact. S1 Medial Branch Nerve Block (MBB): The target area for the  S1 medial branch is at the posterior and inferior 6 o'clock position of the L5-S1 facet joint. Under fluoroscopic guidance, the Quincke needle inserted for the L5 MBB was redirected until contact was made with os over the inferior and postero aspect of the sacrum, at the 6 o' clock position under the L5-S1 facet joint (Target area). After negative aspiration for blood, 0.5 mL of the nerve block solution was injected without difficulty or complication. The needle was removed intact.  Nerve block solution: 0.2% PF-Ropivacaine + Triamcinolone (40 mg/mL) diluted to a final concentration of 4 mg of Triamcinolone/mL of Ropivacaine The unused portion of the solution was discarded in the proper designated containers. Procedural Needles: 22-gauge, 3.5-inch, Quincke needles used for all levels.  Once the entire procedure was completed, the treated area was cleaned, making sure to leave some of the prepping solution back to take advantage of its long term bactericidal properties.   Illustration of the posterior view of the lumbar spine and the posterior neural structures. Laminae of L2 through S1 are labeled. DPRL5, dorsal primary ramus of L5; DPRS1, dorsal primary ramus of S1; DPR3, dorsal primary ramus of L3; FJ, facet (zygapophyseal) joint L3-L4; I, inferior articular process of L4; LB1, lateral branch  of dorsal primary ramus of L1; IAB, inferior articular branches from L3 medial branch (supplies L4-L5 facet joint); IBP, intermediate branch plexus; MB3, medial branch of dorsal primary ramus of L3; NR3, third lumbar nerve root; S, superior articular process of L5; SAB, superior articular branches from L4 (supplies L4-5 facet joint also); TP3, transverse process of L3.  Vitals:   01/14/19 0948 01/14/19 0958 01/14/19 1008 01/14/19 1018  BP: (!) 127/97 (!) 138/101 (!) 142/88 (!) 151/84  Pulse:      Resp: 12 12 15 16   Temp:  97.6 F (36.4 C)  97.6 F (36.4 C)  TempSrc:      SpO2: 99% 99% 99% 99%  Weight:      Height:         Start Time: 0937 hrs. End Time: 0948 hrs.  Imaging Guidance (Spinal):          Type of Imaging Technique: Fluoroscopy Guidance (Spinal) Indication(s): Assistance in needle guidance and placement for procedures requiring needle placement in or near specific anatomical locations not easily accessible without such assistance. Exposure Time: Please see nurses notes. Contrast: None used. Fluoroscopic Guidance: I was personally present during the use of fluoroscopy. "Tunnel Vision Technique" used to obtain the best possible view of the target area. Parallax error corrected before commencing the procedure. "Direction-depth-direction" technique used to introduce the needle under continuous pulsed fluoroscopy. Once target was reached, antero-posterior, oblique, and lateral fluoroscopic projection used confirm needle placement in all planes. Images permanently stored in EMR. Interpretation: No contrast injected. I personally interpreted the imaging intraoperatively. Adequate needle placement confirmed in multiple planes. Permanent images saved into the patient's record.  Antibiotic Prophylaxis:   Anti-infectives (From admission, onward)   None     Indication(s): None identified  Post-operative Assessment:  Post-procedure Vital Signs:  Pulse/HCG Rate: 6260 Temp: 97.6 F  (36.4 C) Resp: 16 BP: (!) 151/84 SpO2: 99 %  EBL: None  Complications: No immediate post-treatment complications observed by team, or reported by patient.  Note: The patient tolerated the entire procedure well. A repeat set of vitals were taken after the procedure and the patient was kept under observation following institutional policy, for this type of procedure. Post-procedural neurological assessment was performed, showing return to baseline, prior to  discharge. The patient was provided with post-procedure discharge instructions, including a section on how to identify potential problems. Should any problems arise concerning this procedure, the patient was given instructions to immediately contact us, at any time, without hesitation. In any case, we plan to contact the patient by telephone for a follow-up status report regarding this interventional procedure.  Comments:  No additional relevant information.  Plan of Care  Orders:  Orders Placed This Encounter  Procedures   LUMBAR FACET(MEDIAL BRANCH NERVE BLOCK) MBNB    Scheduling Instructions:     Procedure: Lumbar facet block (AKA.: Lumbosacral medial branch nerve block) (without steroids)     Side: Bilateral     Level: L3-4, L4-5, & L5-S1 Facets (L2, L3, L4, L5, & S1 Medial Branch Nerves)     Sedation: With Sedation.     Timeframe: Today    Order Specific Question:   Where will this procedure be performed?    Answer:   [facility]   DG [facility] C-ARM 1-60 MIN NO REPORT    Intraoperative interpretation by procedural physician at [place].    Standing Status:   Standing    Number of Occurrences:   1    Order Specific Question:   Reason for exam:    Answer:   Assistance in needle guidance and placement for procedures requiring needle placement in or near specific anatomical locations not easily accessible without such assistance.   Informed Consent Details: Physician/Practitioner Attestation; Transcribe  to consent form and obtain patient signature    Nursing Order: Transcribe to consent form and obtain patient signature. Note: Always confirm laterality of pain with [name], before procedure. Procedure: Lumbar Facet Block  under fluoroscopic guidance Indication/Reason: Low Back Pain, with our without leg pain, due to Facet Joint Arthralgia (Joint Pain) known as Lumbar Facet Syndrome, secondary to Lumbar, and/or Lumbosacral Spondylosis (Arthritis of the Spine), without myelopathy or radiculopathy (Nerve Damage). Provider Attestation: I, [place] Dossie Arbour, MD, (Pain Management Specialist), the physician/practitioner, attest that I have discussed with the patient the benefits, risks, side effects, alternatives, likelihood of achieving goals and potential problems during recovery for the procedure that I have provided informed consent.   Provide equipment / supplies at bedside    Equipment required: Single use, disposable, "Block Tray"    Standing Status:   Standing    Number of Occurrences:   1    Order Specific Question:   Specify    Answer:   Block Tray   Miscellanous precautions    >>>>>>CONTRAST ALLERGY<<<<<<    Standing Status:   Standing    Number of Occurrences:   1   Miscellanous precautions    >>>>>SHELLFISH ALLERGY<<<<<< NOTE: Although It is true that patients can have allergies to shellfish and that shellfish contain iodine, most shellfish  allergies are due to two protein allergens present in the shellfish: tropomyosins and parvalbumin. Not all patients with shellfish allergies are allergic to iodine. However, as a precaution, avoid using iodine containing products.    Standing Status:   Standing    Number of Occurrences:   1   Chronic Opioid Analgesic:  Oxycodone IR 5 mg, 1 tab PO q 6 hrs (20 mg/day of oxycodone) (has enough until 11/27/2018) MME/day: 30 mg/day.   Medications ordered for procedure: Meds ordered this encounter  Medications   lidocaine (XYLOCAINE) 2 %  (with pres) injection 400 mg   lactated ringers infusion 1,000 mL   midazolam (VERSED) 5 MG/5ML injection 1  mg    Make sure Flumazenil is available in the pyxis when using this medication. If oversedation occurs, administer 0.2 mg IV over 15 sec. If after 45 sec no response, administer 0.2 mg again over 1 min; may repeat at 1 min intervals; not to exceed 4 doses (1 mg)   fentaNYL (SUBLIMAZE) injection 25 mcg    Make sure Narcan is available in the pyxis when using this medication. In the event of respiratory depression (RR< 8/min): Titrate NARCAN (naloxone) in increments of 0.1 to 0.2 mg IV at 2-3 minute intervals, until desired degree of reversal.   ropivacaine (PF) 2 mg/mL (0.2%) (NAROPIN) injection 18 mL   cyclobenzaprine (FLEXERIL) 10 MG tablet    Sig: Take 1 tablet (10 mg total) by mouth 3 (three) times daily as needed for muscle spasms (after procedures).    Dispense:  90 tablet    Refill:  0    Fill one day early if pharmacy is closed on scheduled refill date. May substitute for generic if available.   Medications administered: We administered lidocaine, lactated ringers, midazolam, fentaNYL, and ropivacaine (PF) 2 mg/mL (0.2%).  See the medical record for exact dosing, route, and time of administration.  Follow-up plan:   Return in about 2 weeks (around 01/28/2019) for (VV), (MM).       Interventional management options: Considering:   NOTE: SHELLFISH ALLERGY; Dexamethasone & Prednisone allergy Palliativebilateral suprascapular nerve block(without steroids) #3 Possiblerightsuprascapular nerve RFA #1   Palliative PRN treatment(s):   Diagnostic bilateral lumbar facet block #2  Palliativebilateral suprascapular NB #3(No Steroids) Palliative leftsuprascapular nerve RFA #2(last done on 01/04/2017)     Recent Visits Date Type Provider Dept  12/30/18 Office Visit Milinda Pointer, MD [facility]  12/05/18 Procedure visit Milinda Pointer, MD [facility]  11/12/18 Procedure visit Milinda Pointer, MD [facility]  Showing recent visits within past 90 days and meeting all other requirements   Today's Visits Date Type Provider Dept  01/14/19 Procedure visit Milinda Pointer, MD [facility]  Showing today's visits and meeting all other requirements   Future Appointments Date Type Provider Dept  01/29/19 Appointment Milinda Pointer, MD [facility]  Showing future appointments within next 90 days and meeting all other requirements   Disposition: Discharge home  Discharge Date & Time: 01/14/2019; 1020 hrs.   Primary Care Physician: Virginia Crews, MD Location: [facility] Note by: Gaspar Cola, MD Date: 01/14/2019; Time: 11:53 AM  Disclaimer:  Medicine is not an exact science. The only guarantee in medicine is that nothing is guaranteed. It is important to note that the decision to proceed with this intervention was based on the information collected from the patient. The Data and conclusions were drawn from the patient's questionnaire, the interview, and the physical examination. Because the information was provided in large part by the patient, it cannot be guaranteed that it has not been purposely or unconsciously manipulated. Every effort has been made to obtain as much relevant data as possible for this evaluation. It is important to note that the conclusions that lead to this procedure are derived in large part from the available data. Always take into account that the treatment will also be dependent on availability of resources and existing treatment guidelines, considered by other Pain Management Practitioners as being common knowledge and practice, at the time of the intervention. For Medico-Legal purposes, it is also important to point out that variation in procedural techniques and  pharmacological choices are the acceptable norm. The indications,  contraindications, technique, and results of the above procedure should only be interpreted and judged by a Board-Certified Interventional Pain Specialist with extensive familiarity and expertise in the same exact procedure and technique.

## 2019-01-15 ENCOUNTER — Telehealth: Payer: Self-pay

## 2019-01-15 NOTE — Telephone Encounter (Signed)
Post procedure phone call.  Patient states she is doing well.  

## 2019-01-16 ENCOUNTER — Ambulatory Visit: Payer: Self-pay | Admitting: *Deleted

## 2019-01-16 DIAGNOSIS — Z599 Problem related to housing and economic circumstances, unspecified: Secondary | ICD-10-CM

## 2019-01-16 DIAGNOSIS — F331 Major depressive disorder, recurrent, moderate: Secondary | ICD-10-CM

## 2019-01-16 DIAGNOSIS — Z598 Other problems related to housing and economic circumstances: Secondary | ICD-10-CM

## 2019-01-16 NOTE — Chronic Care Management (AMB) (Signed)
Chronic Care Management    Clinical Social Work Follow Up Note  01/16/2019 Name: [name] MRN: [id number] DOB: 02/17/1961  [name] is a 58 y.o. year old female who is a primary care patient of Bacigalupo, Dionne Bucy, MD. The CCM team was consulted for assistance with Mental Health Counseling and [employer].   Review of patient status, including review of consultants reports, other relevant assessments, and collaboration with appropriate care team members and the patient's provider was performed as part of comprehensive patient evaluation and provision of chronic care management services.    Advanced Directives Status: <no information> See Care Plan for related entries.   Outpatient Encounter Medications as of 01/16/2019  Medication Sig  . acetaminophen (TYLENOL) 500 MG tablet Take 500 mg by mouth every 6 (six) hours as needed.  Marland Kitchen albuterol (PROVENTIL HFA) 108 (90 Base) MCG/ACT inhaler Inhale 1-2 puffs into the lungs every 4 (four) hours as needed.   . ALPRAZolam (XANAX) 1 MG tablet Take 1 mg by mouth at bedtime as needed for anxiety.  . calcium carbonate (TUMS - DOSED IN MG ELEMENTAL CALCIUM) 500 MG chewable tablet Chew 1 tablet by mouth 3 (three) times daily with meals. As needed  . cyclobenzaprine (FLEXERIL) 10 MG tablet Take 1 tablet (10 mg total) by mouth 3 (three) times daily as needed for muscle spasms (after procedures).  Marland Kitchen dexlansoprazole (DEXILANT) 60 MG capsule Take 1 capsule (60 mg total) by mouth daily. **PLEASE SCHEDULE FOLLOW UP APPT**  . Docusate Sodium (COLACE PO) Take by mouth daily.   . Fluticasone-Salmeterol (ADVAIR) 500-50 MCG/DOSE AEPB Inhale 1 puff into the lungs 2 (two) times daily. As needed  . metoprolol succinate (TOPROL-XL) 50 MG 24 hr tablet TAKE 1 TABLET BY MOUTH EVERY DAY  . montelukast (SINGULAIR) 10 MG tablet TAKE 1 TABLET (10 MG TOTAL) BY MOUTH DAILY.  Marland Kitchen ondansetron (ZOFRAN) 4 MG tablet TAKE 1 TABLET BY MOUTH EVERY 8 HOURS AS NEEDED FOR  NAUSEA AND VOMITING  . [START ON 01/26/2019] oxyCODONE (OXY IR/ROXICODONE) 5 MG immediate release tablet Take 1 tablet (5 mg total) by mouth every 6 (six) hours as needed for severe pain. Must last 30 days  . [START ON 02/25/2019] oxyCODONE (OXY IR/ROXICODONE) 5 MG immediate release tablet Take 1 tablet (5 mg total) by mouth every 6 (six) hours as needed for severe pain. Must last 30 days  . [START ON 03/27/2019] oxyCODONE (OXY IR/ROXICODONE) 5 MG immediate release tablet Take 1 tablet (5 mg total) by mouth every 6 (six) hours as needed for severe pain. Must last 30 days  . Spacer/Aero-Holding Chambers (AEROCHAMBER MV) inhaler by Does not apply route.  . valACYclovir (VALTREX) 1000 MG tablet TAKE TWO TABLETS BY MOUTH TWICE DAILY FOR ONE DAY FEVER BLISTER   No facility-administered encounter medications on file as of 01/16/2019.      Goals Addressed            This Visit's Progress   . "I am dealing with so much stress with my son and grandchildren" (pt-stated)       Patient was not able to talk and requested a return call next week. However did state that she ws able to pay the past due electric bill and had no need for assistance with the [facility] at this time.   Current Barriers:  . Chronic Mental Health needs related to anxiety and depression . Mental Health Concerns  . Family and relationship dysfunction . Lacks knowledge of  community resource: community resources to assist with financial difficulties . Suicidal Ideation/Homicidal Ideation: No  Clinical Social Work Goal(s):  Marland Kitchen Over the next  90 days, patient will work with SW bi-weekly by telephone or in person to reduce or manage symptoms of anxiety and depression related to her parenting responsibilities of her 3 grandchildren and dysfunctional relationship with her son   Interventions: . Patient informed that the [facility] approved financial assistance to cover  patient's electric bill . Patient confirmed that the electric bill has already been paid  Patient Self Care Activities:  . Performs ADL's independently . Performs IADL's independently . Ability for insight  Patient Coping Strengths:  . Family . Able to Communicate Effectively  Patient Self Care Deficits:  . Difficulties in establishing and maintaining boundaries with her adult son  Please see past updates related to this goal by clicking on the "Past Updates" button in the selected goal          Follow Up Plan: SW will follow up with patient by phone over the next 2 weeks    [place], [place] Worker  [facility] Care Management [phone]

## 2019-01-16 NOTE — Patient Instructions (Addendum)
Thank you allowing the Chronic Care Management Team to be a part of your care! It was a pleasure speaking with you today!  1. Please call this social worker with any questions or concerns regarding your community resource needs  CCM (Chronic Care Management) Team   Neldon Labella  RN, BSN Nurse Care Coordinator  [phone]  Ruben Reason PharmD  Clinical Pharmacist  [phone]   [place], LCSW Clinical Social Worker [phone]  Goals Addressed            This Visit's Progress   . "I am dealing with so much stress with my son and grandchildren" (pt-stated)       Current Barriers:  . Chronic Mental Health needs related to anxiety and depression . Mental Health Concerns  . Family and relationship dysfunction . Lacks knowledge of community resource: community resources to assist with financial difficulties . Suicidal Ideation/Homicidal Ideation: No  Clinical Social Work Goal(s):  Marland Kitchen Over the next  90 days, patient will work with SW bi-weekly by telephone or in person to reduce or manage symptoms of anxiety and depression related to her parenting responsibilities of her 3 grandchildren and dysfunctional relationship with her son   Interventions: . Patient informed that the [facility] approved financial assistance to cover patient's electric bill . Patient confirmed that the electric bill has already been paid  Patient Self Care Activities:  . Performs ADL's independently . Performs IADL's independently . Ability for insight  Patient Coping Strengths:  . Family . Able to Communicate Effectively  Patient Self Care Deficits:  . Difficulties in establishing and maintaining boundaries with her adult son  Please see past updates related to this goal by clicking on the "Past Updates" button in the selected goal          The patient verbalized understanding of instructions provided today and declined a print copy of patient  instruction materials.   Telephone follow up appointment with care management team member scheduled for: 01/22/19

## 2019-01-17 ENCOUNTER — Telehealth: Payer: Self-pay

## 2019-01-21 ENCOUNTER — Other Ambulatory Visit: Payer: Self-pay | Admitting: Family Medicine

## 2019-01-21 DIAGNOSIS — J45909 Unspecified asthma, uncomplicated: Secondary | ICD-10-CM

## 2019-01-22 ENCOUNTER — Ambulatory Visit: Payer: Self-pay | Admitting: *Deleted

## 2019-01-22 DIAGNOSIS — Z599 Problem related to housing and economic circumstances, unspecified: Secondary | ICD-10-CM

## 2019-01-22 DIAGNOSIS — F331 Major depressive disorder, recurrent, moderate: Secondary | ICD-10-CM

## 2019-01-22 DIAGNOSIS — Z598 Other problems related to housing and economic circumstances: Secondary | ICD-10-CM

## 2019-01-23 NOTE — Chronic Care Management (AMB) (Signed)
Chronic Care Management    Clinical Social Work Follow Up Note  01/23/2019 Name: [name] MRN: [id number] DOB: 1961/01/22  [name] is a 58 y.o. year old female who is a primary care patient of Bacigalupo, Dionne Bucy, MD. The CCM team was consulted for assistance with Mental Health Counseling and Resources.   Review of patient status, including review of consultants reports, other relevant assessments, and collaboration with appropriate care team members and the patient's provider was performed as part of comprehensive patient evaluation and provision of chronic care management services.    Advanced Directives Status: <no information> See Care Plan for related entries.   Outpatient Encounter Medications as of 01/22/2019  Medication Sig  . acetaminophen (TYLENOL) 500 MG tablet Take 500 mg by mouth every 6 (six) hours as needed.  Marland Kitchen albuterol (PROVENTIL HFA) 108 (90 Base) MCG/ACT inhaler Inhale 1-2 puffs into the lungs every 4 (four) hours as needed.   . ALPRAZolam (XANAX) 1 MG tablet Take 1 mg by mouth at bedtime as needed for anxiety.  . calcium carbonate (TUMS - DOSED IN MG ELEMENTAL CALCIUM) 500 MG chewable tablet Chew 1 tablet by mouth 3 (three) times daily with meals. As needed  . cyclobenzaprine (FLEXERIL) 10 MG tablet Take 1 tablet (10 mg total) by mouth 3 (three) times daily as needed for muscle spasms (after procedures).  Marland Kitchen dexlansoprazole (DEXILANT) 60 MG capsule Take 1 capsule (60 mg total) by mouth daily. **PLEASE SCHEDULE FOLLOW UP APPT**  . Docusate Sodium (COLACE PO) Take by mouth daily.   . Fluticasone-Salmeterol (ADVAIR) 500-50 MCG/DOSE AEPB Inhale 1 puff into the lungs 2 (two) times daily. As needed  . metoprolol succinate (TOPROL-XL) 50 MG 24 hr tablet TAKE 1 TABLET BY MOUTH EVERY DAY  . montelukast (SINGULAIR) 10 MG tablet TAKE 1 TABLET BY MOUTH EVERY DAY  . ondansetron (ZOFRAN) 4 MG tablet TAKE 1 TABLET BY MOUTH EVERY 8 HOURS AS NEEDED FOR NAUSEA AND VOMITING   . [START ON 01/26/2019] oxyCODONE (OXY IR/ROXICODONE) 5 MG immediate release tablet Take 1 tablet (5 mg total) by mouth every 6 (six) hours as needed for severe pain. Must last 30 days  . [START ON 02/25/2019] oxyCODONE (OXY IR/ROXICODONE) 5 MG immediate release tablet Take 1 tablet (5 mg total) by mouth every 6 (six) hours as needed for severe pain. Must last 30 days  . [START ON 03/27/2019] oxyCODONE (OXY IR/ROXICODONE) 5 MG immediate release tablet Take 1 tablet (5 mg total) by mouth every 6 (six) hours as needed for severe pain. Must last 30 days  . Spacer/Aero-Holding Chambers (AEROCHAMBER MV) inhaler by Does not apply route.  . valACYclovir (VALTREX) 1000 MG tablet TAKE TWO TABLETS BY MOUTH TWICE DAILY FOR ONE DAY FEVER BLISTER   No facility-administered encounter medications on file as of 01/22/2019.      Goals Addressed            This Visit's Progress   . "I am dealing with so much stress with my son and grandchildren" (pt-stated)       Current Barriers:  . Chronic Mental Health needs related to anxiety and depression . Mental Health Concerns  . Family and relationship dysfunction . Lacks knowledge of community resource: community resources to assist with financial difficulties . Suicidal Ideation/Homicidal Ideation: No  Clinical Social Work Goal(s):  Marland Kitchen Over the next  90 days, patient will work with SW bi-weekly by telephone or in person to reduce or manage symptoms of anxiety and depression  related to her parenting responsibilities of her 3 grandchildren and dysfunctional relationship with her son   Interventions:  . Relationship with her son explored, which remains conflictual, patient requesting any available resources for Nar-Anon . Explored progress of managing home schooling with her 3 grandchildren, with patient acknowledging some improvement due to setting firm boundaries, patient requesting information on support groups available . Positive reinforcement provided  regarding willingness to participate in support groups for additional support   Patient Self Care Activities:  . Performs ADL's independently . Performs IADL's independently . Ability for insight  Patient Coping Strengths:  . Family . Able to Communicate Effectively  Patient Self Care Deficits:  . Difficulties in establishing and maintaining boundaries with her adult son  Please see past updates related to this goal by clicking on the "Past Updates" button in the selected goal          Follow Up Plan: SW will follow up with patient by phone over the next 2 weeks    [place], [place] Worker  [facility] Care Management [phone]

## 2019-01-23 NOTE — Patient Instructions (Signed)
Thank you allowing the Chronic Care Management Team to be a part of your care! It was a pleasure speaking with you today!  1. Please call this social worker with any questions or concerns regarding your community resource needs.  CCM (Chronic Care Management) Team   Neldon Labella RN, BSN Nurse Care Coordinator  [phone]  Ruben Reason PharmD  Clinical Pharmacist  [phone]   Elliot Gurney, LCSW Clinical Social Worker [phone]  Goals Addressed            This Visit's Progress   . "I am dealing with so much stress with my son and grandchildren" (pt-stated)       Current Barriers:  . Chronic Mental Health needs related to anxiety and depression . Mental Health Concerns  . Family and relationship dysfunction . Lacks knowledge of community resource: community resources to assist with financial difficulties . Suicidal Ideation/Homicidal Ideation: No  Clinical Social Work Goal(s):  Marland Kitchen Over the next  90 days, patient will work with SW bi-weekly by telephone or in person to reduce or manage symptoms of anxiety and depression related to her parenting responsibilities of her 3 grandchildren and dysfunctional relationship with her son   Interventions:  . Relationship with her son explored, which remains conflictual, patient requesting any available resources for Nar-Anon . Explored progress of managing home schooling with her 3 grandchildren, with patient acknowledging some improvement due to setting firm boundaries, patient requesting information on support groups available . Positive reinforcement provided regarding willingness to participate in support groups for additional support   Patient Self Care Activities:  . Performs ADL's independently . Performs IADL's independently . Ability for insight  Patient Coping Strengths:  . Family . Able to Communicate Effectively  Patient Self Care Deficits:  . Difficulties in establishing and maintaining boundaries  with her adult son  Please see past updates related to this goal by clicking on the "Past Updates" button in the selected goal          The patient verbalized understanding of instructions provided today and declined a print copy of patient instruction materials.   Telephone follow up appointment with care management team member scheduled for: 01/27/19

## 2019-01-27 ENCOUNTER — Telehealth: Payer: Self-pay | Admitting: *Deleted

## 2019-01-27 ENCOUNTER — Ambulatory Visit: Payer: Self-pay | Admitting: *Deleted

## 2019-01-27 NOTE — Chronic Care Management (AMB) (Signed)
   Chronic Care Management   Unsuccessful Call Note 01/27/2019 Name: [name] MRN: [id number] DOB: Nov 16, 1960  Patient is a 58 year old female who sees Dr. Brita Romp for primary care. Dr. Brita Romp asked the CCM team to consult the patient for Mental Health Counseling and Resources.      This social worker was unable to reach patient via telephone today to provide requested resources on Nar-anon [phone] [email or web address]. I have left HIPAA compliant voicemail asking patient to return my call. (unsuccessful outreach #2).   Plan: Will follow-up within 7 business days via telephone.      Elliot Gurney, [place] Worker  [facility] Care [facility] [phone]

## 2019-01-28 ENCOUNTER — Encounter: Payer: Self-pay | Admitting: Pain Medicine

## 2019-01-28 NOTE — Progress Notes (Signed)
Pain Management Virtual Encounter Note - Virtual Visit via Telephone Telehealth (real-time audio visits between healthcare provider and patient).   Patient's Phone No. & Preferred Pharmacy:  [phone] (home); There is no such number on file (mobile).; (Preferred) [phone] [email or web address]  [id number] [address], [place] [address] Phone: [phone] Fax: [phone]    Pre-screening note:  Our staff contacted [name] and offered her an "in person", "face-to-face" appointment versus a telephone encounter. She indicated preferring the telephone encounter, at this time.   Reason for Virtual Visit: COVID-19*  Social distancing based on CDC and AMA recommendations.   I contacted [name] on 01/29/2019 via telephone.      I clearly identified myself as Gaspar Cola, MD. I verified that I was speaking with the correct person using two identifiers (Name: [name], and date of birth: Mar 20, 1960).  Advanced Informed Consent I sought verbal advanced consent from [name] for virtual visit interactions. I informed [name] of possible security and privacy concerns, risks, and limitations associated with providing "not-in-person" medical evaluation and management services. I also informed [name] of the availability of "in-person" appointments. Finally, I informed her that there would be a charge for the virtual visit and that she could be  personally, fully or partially, financially responsible for it. [name] expressed understanding and agreed to proceed.   Historic Elements   [name] is a 58 y.o. year old, female patient evaluated today after her last encounter by our practice on 01/15/2019. 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), Anemia, Anxiety, Asthma, Cephalalgia (08/18/2014), Cervical disc disease  (08/18/2014), Chronic headaches, DDD (degenerative disc disease), Depression, Dissociative disorder, Dizziness (04/22/2013), Duodenal ulcer with hemorrhage and perforation (Joanna) (04/27/2003), GERD (gastroesophageal reflux disease), H/O arthrodesis (08/18/2014), Headache(784.0), History of blood transfusion, History of cervical spinal surgery (01/04/2015), History of kidney stones, Hypertension, Inverted T wave, Narrowing of intervertebral disc space (08/18/2014), Orthostatic hypotension (04/22/2013), Pain, Pneumonia, PONV (postoperative nausea and vomiting), Postop Hyponatremia (05/14/2012), Postoperative anemia due to acute blood loss (05/14/2012), PTSD (post-traumatic stress disorder), Right foot drop, and Right hip arthralgia (08/18/2014). She also  has a past surgical history that includes RIGHT HIP ARTHROSCOPY FOR LABRAL TEAR (ABOUT 2010); Anterior fusion cervical spine (MAY 2012); Nasal septum surgery (MARCH 2013); Back surgery (2009); Tonsillectomy; Abdominal hysterectomy; DIAGNOSTIC LAPAROSCOPIES - MULTIPLE FOR ENDOMETRIOSIS; Cholecystectomy; Hip arthroscopy (09/20/2011); Shoulder arthroscopy (05-06-12); Carpal tunnel release (05-06-12); Total hip arthroplasty (Right, 05/13/2012); Anterior cervical decomp/discectomy fusion (N/A, 10/30/2012); Posterior cervical fusion/foraminotomy (N/A, 04/16/2013); Breast biopsy (Right, 2008); Breast excisional biopsy (Left, 1998); Breast reduction surgery (Bilateral, 06/2016); Colonoscopy with propofol (N/A, 01/26/2017); Esophagogastroduodenoscopy (egd) with propofol (N/A, 01/26/2017); Esophageal dilation (N/A, 01/26/2017); polypectomy (N/A, 01/26/2017); PH impedance study (N/A, 05/30/2017); Esophageal manometry (N/A, 05/30/2017); and Radiofrequency ablation nerves. [name] has a current medication list which includes the following prescription(s): acetaminophen, albuterol, alprazolam, calcium carbonate, cyclobenzaprine, dexlansoprazole, docusate sodium, fluticasone-salmeterol, metoprolol  succinate, montelukast, ondansetron, oxycodone, oxycodone hcl, oxycodone hcl, oxycodone hcl, aerochamber mv, and valacyclovir. She  reports that she has never smoked. She has never used smokeless tobacco. She reports that she does not drink alcohol or use drugs. [name] is allergic to amoxicillin; chlorhexidine gluconate; clindamycin; codeine; erythromycin; penicillin g; sulfa antibiotics; levofloxacin; shellfish allergy; decadron [dexamethasone]; mangifera indica; papaya derivatives; betadine [povidone iodine]; chlorhexidine; clarithromycin; clindamycin/lincomycin; other; povidone-iodine; and prednisone.   HPI  Today,  she is being contacted for a post-procedure assessment.  As expected, and the patient did not attain any long-term benefit from the diagnostic bilateral lumbar facet blocks since it did not have any steroids in it.  Above all, the purpose of this last diagnostic injection was not only to find out if the findings of the first diagnostic injection were accurate, but also to see if it would be technically possible to do a radiofrequency ablation on this patient.  This last procedure has proven to be technically difficult to find the exact spot for the medial branches.  This would suggest that further consideration should be given at all other alternatives.  The problem that we encounter is that her fusion was extensive and therefore it may be difficult to find an area where we could go into the intrathecal canal.  However, there is always a possibility of going below the fusion and attempting to thread the catheter cephalad as far as possible.     Today we talked about her alternatives and she is not really interested in an implant or trial at this time.  Since there is not much I think we can do from the interventional standpoint, due to all of the hardware that she has in there, I see that the only option here is to continue her on her current medications.  I offered the membrane stabilizers,  but she indicated that she has already tried the gabapentin (Neurontin) and the pregabalin (Lyrica).  She indicates that neither one of them helped her pain.  The only other option here is to increase her oxycodone to 10 mg every 6 hours.  I have talked to her about this alternative and the fact that I will never increase it more than that again.  I reminded her that whenever she feels that the medication is not helping any longer or she feels that the condition is getting worse, but upon follow-up no significant changes can be detected, this would suggest a problem with tolerance and she will need to do a "Drug Holiday".  She understood and she indicates having done this before, twice.  She also indicates having been on the oxycodone 10 mg 4 times daily while she was actually on Xanax 2 mg at a time, and she states that she had no problems with that.  I still warned her about the drug to drug interaction between the benzodiazepines and the opioids and she indicated understanding accepting.  Truthfully, by now she probably has significant tolerance to both medication classes and therefore unlikely to be a "major" risk for the respiratory depression and death, unless she was to be recklessly careless or was to take too much medication, on purpose.  At this point she has given me no indications or reason to believe that she has any suicidal ideations or that she is a suicide risk.  Post-Procedure Evaluation  Procedure: Diagnostic bilateral lumbar facet block #2 (no steroids) under fluoroscopic guidance and IV sedation Pre-procedure pain level:  4/10 Post-procedure: 0/10 (100% relief)  Sedation: Sedation provided.  Landis Martins, RN  01/28/2019 12:59 PM  Sign when Signing Visit Pain relief after procedure (treated area only): (Questions asked to patient) 1. Starting about 15 minutes after the procedure, and "while the area was still numb" (from the local anesthetics), were you having any of your usual pain  "in that area"? (NOT including the discomfort from the needle sticks.) First 1 hour:100% better. First 4-6 hours: 100% better. 2. Assuming that it  did get numb. How long was the area numb? 100 % benefit, longer than 6 hours. How long? 2-3 hoursre the procedure? Current benefit:0 % better. 4. Can you move better now? Improvement in ROM (Range of Motion): No. 5. Can you do more now? Improvement in function: No. 4. Did you have any problems with the procedure? Side-effects/Complications: No.   Current benefits: Defined as benefit that persist at this time.   Analgesia:  Back to baseline Function: Back to baseline ROM: Back to baseline  Pharmacotherapy Assessment  Analgesic: Oxycodone IR 5 mg, 1 tab PO q 6 hrs (20 mg/day of oxycodone) (has enough until 11/27/2018) MME/day: 30 mg/day.   Monitoring: Pharmacotherapy: No side-effects or adverse reactions reported. [place] PMP: PDMP reviewed during this encounter.       Compliance: No problems identified. Effectiveness: Clinically acceptable. Plan: Refer to "POC".  UDS:  Summary  Date Value Ref Range Status  11/11/2018 Note  Final    Comment:    ==================================================================== [employer] Select 13 (MW) ==================================================================== Test                             Result       Flag       Units Drug Present and Declared for Prescription Verification   Alprazolam                     167          EXPECTED   ng/mg creat   Alpha-hydroxyalprazolam        564          EXPECTED   ng/mg creat    Source of alprazolam is a scheduled prescription medication. Alpha-    hydroxyalprazolam is an expected metabolite of alprazolam.   Oxycodone                      2512         EXPECTED   ng/mg creat   Oxymorphone                    1271         EXPECTED   ng/mg creat   Noroxycodone                   6433         EXPECTED   ng/mg creat   Noroxymorphone                 145           EXPECTED   ng/mg creat    Sources of oxycodone are scheduled prescription medications.    Oxymorphone, noroxycodone, and noroxymorphone are expected    metabolites of oxycodone. Oxymorphone is also available as a    scheduled prescription medication. ==================================================================== Test                      Result    Flag   Units      Ref Range   Creatinine              126              mg/dL      >=20 ==================================================================== Declared Medications:  The flagging and interpretation on this report are based on the  following declared medications.  Unexpected results may arise from  inaccuracies in the declared medications.  **Note:  The testing scope of this panel includes these medications:  Alprazolam  Oxycodone  **Note: The testing scope of this panel does not include the  following reported medications:  Albuterol  Bupropion (Wellbutrin)  Calcium  Dexlansoprazole (Dexilant)  Docusate  Duloxetine  Fluticasone  Magnesium (Mag-Ox)  Metoprolol  Montelukast  Ondansetron  Pregabalin  Salmeterol  Valacyclovir ==================================================================== For clinical consultation, please call [phone]. ====================================================================    Laboratory Chemistry Profile (12 mo)  Renal: 11/11/2018: BUN 6; BUN/Creatinine Ratio 7; Creatinine, Ser 0.90  Lab Results  Component Value Date   GFRAA 81 11/11/2018   GFRNONAA 70 11/11/2018   Hepatic: 11/11/2018: Albumin 4.0 Lab Results  Component Value Date   AST 28 11/11/2018   ALT 9 12/25/2017   Other: 11/11/2018: 25-Hydroxy, Vitamin D 23; 25-Hydroxy, Vitamin D-2 9.8; 25-Hydroxy, Vitamin D-3 13; CRP 12; Sed Rate 42; Vitamin B-12 427 Note: Above Lab results reviewed.  Imaging  Last 90 days:  Dg Pain Clinic C-arm 1-60 Min No Report  Result Date: 01/14/2019 Fluoro was used, but no  Radiologist interpretation will be provided. Please refer to "NOTES" tab for provider progress note.  Dg Pain Clinic C-arm 1-60 Min No Report  Result Date: 12/05/2018 Fluoro was used, but no Radiologist interpretation will be provided. Please refer to "NOTES" tab for provider progress note.      Assessment  The primary encounter diagnosis was Chronic pain syndrome. Diagnoses of Chronic low back pain (Primary area of Pain) (Bilateral) (L>R) w/o sciatica, Chronic hip pain (Secondary Area of Pain) (Bilateral) (L>R), Chronic shoulder pain (Tertiary Area of Pain)  (Bilateral) (L>R), Lumbar facet syndrome (Bilateral) (L>R), and Chronic musculoskeletal pain were also pertinent to this visit.  Plan of Care  I am having [name] start on Oxycodone HCl, Oxycodone HCl, and Oxycodone HCl. I am also having her maintain her AeroChamber MV, albuterol, Docusate Sodium (COLACE PO), Fluticasone-Salmeterol, valACYclovir, ALPRAZolam, dexlansoprazole, oxyCODONE, ondansetron, metoprolol succinate, acetaminophen, calcium carbonate, montelukast, and cyclobenzaprine.  Pharmacotherapy (Medications Ordered): Meds ordered this encounter  Medications  . oxyCODONE 10 MG TABS    Sig: Take 1 tablet (10 mg total) by mouth every 6 (six) hours as needed for severe pain. Must last 30 days    Dispense:  120 tablet    Refill:  0    Chronic Pain: STOP Act (Not applicable) Fill 1 day early if closed on refill date. Do not fill until: 02/25/2019. To last until: 03/27/2019. Avoid benzodiazepines within 8 hours of opioids  . oxyCODONE 10 MG TABS    Sig: Take 1 tablet (10 mg total) by mouth every 6 (six) hours as needed for severe pain. Must last 30 days    Dispense:  120 tablet    Refill:  0    Chronic Pain: STOP Act (Not applicable) Fill 1 day early if closed on refill date. Do not fill until: 03/27/2019. To last until: 04/26/2019. Avoid benzodiazepines within 8 hours of opioids  . oxyCODONE 10 MG TABS    Sig: Take 1 tablet  (10 mg total) by mouth every 6 (six) hours as needed for severe pain. Must last 30 days    Dispense:  120 tablet    Refill:  0    Chronic Pain: STOP Act (Not applicable) Fill 1 day early if closed on refill date. Do not fill until: 04/26/2019. To last until: 05/26/2019. Avoid benzodiazepines within 8 hours of opioids  . cyclobenzaprine (FLEXERIL) 10 MG tablet    Sig: Take 1 tablet (10 mg total)  by mouth 3 (three) times daily as needed for muscle spasms (after procedures).    Dispense:  90 tablet    Refill:  0    Fill one day early if pharmacy is closed on scheduled refill date. May substitute for generic if available.   Orders:  No orders of the defined types were placed in this encounter.  Follow-up plan:   Return in about 4 months (around 05/26/2019) for (VV), (MM) to evaluate the recent increase in dose..      Interventional management options: Considering:   NOTE:  1. SHELLFISH ALLERGY; Dexamethasone & Prednisone allergy. 2. NOT a candidate for lumbar facet RFA (due to hardware). 3. NOT a candidate for membrane stabilizers (Neurontin/Lyrica) due to prior failed trials. 4. NOT a candidate for SCS (due to hardware). 5. NOT a candidate for IT Pump (patient's choice due to surgical risks).  Possiblerightsuprascapular nerve RFA #1   Palliative PRN treatment(s):   Diagnostic bilateral lumbar facet block #2 (No benefit in doing injection w/o steroids.) Palliativebilateral suprascapular NB #3(No Steroids) Palliative leftsuprascapular nerve RFA #2(last done on 01/04/2017)    Recent Visits Date Type Provider Dept  01/14/19 Procedure visit Milinda Pointer, MD [facility]  12/30/18 Office Visit Milinda Pointer, MD [facility]  12/05/18 Procedure visit Milinda Pointer, MD [facility]  11/12/18 Procedure visit Milinda Pointer, MD [facility]  Showing recent visits within past 90 days and meeting all other requirements   Today's  Visits Date Type Provider Dept  01/29/19 Telemedicine Milinda Pointer, MD [facility]  Showing today's visits and meeting all other requirements   Future Appointments Date Type Provider Dept  04/23/19 Appointment Milinda Pointer, MD [facility]  Showing future appointments within next 90 days and meeting all other requirements   I discussed the assessment and treatment plan with the patient. The patient was provided an opportunity to ask questions and all were answered. The patient agreed with the plan and demonstrated an understanding of the instructions.  Patient advised to call back or seek an in-person evaluation if the symptoms or condition worsens.  Total duration of non-face-to-face encounter: 20 minutes.  Note by: Gaspar Cola, MD Date: 01/29/2019; Time: 1:57 PM  Note: This dictation was prepared with Dragon dictation. Any transcriptional errors that may result from this process are unintentional.  Disclaimer:  * Given the special circumstances of the COVID-19 pandemic, the federal government has announced that the Office for Civil Rights (OCR) will exercise its enforcement discretion and will not impose penalties on physicians using telehealth in the event of noncompliance with regulatory requirements under the [place] (HIPAA) in connection with the good faith provision of telehealth during the XX123456 national public health emergency. ([facility])

## 2019-01-28 NOTE — Progress Notes (Signed)
Pain relief after procedure (treated area only): (Questions asked to patient) 1. Starting about 15 minutes after the procedure, and "while the area was still numb" (from the local anesthetics), were you having any of your usual pain "in that area"? (NOT including the discomfort from the needle sticks.) First 1 hour:100% better. First 4-6 hours: 100% better. 2. Assuming that it did get numb. How long was the area numb? 100 % benefit, longer than 6 hours. How long? 2-3 hoursre the procedure? Current benefit:0 % better. 4. Can you move better now? Improvement in ROM (Range of Motion): No. 5. Can you do more now? Improvement in function: No. 4. Did you have any problems with the procedure? Side-effects/Complications: No.

## 2019-01-29 ENCOUNTER — Ambulatory Visit: Payer: Medicare Other | Attending: Pain Medicine | Admitting: Pain Medicine

## 2019-01-29 ENCOUNTER — Other Ambulatory Visit: Payer: Self-pay

## 2019-01-29 DIAGNOSIS — M25552 Pain in left hip: Secondary | ICD-10-CM

## 2019-01-29 DIAGNOSIS — G894 Chronic pain syndrome: Secondary | ICD-10-CM

## 2019-01-29 DIAGNOSIS — M545 Low back pain, unspecified: Secondary | ICD-10-CM

## 2019-01-29 DIAGNOSIS — M25512 Pain in left shoulder: Secondary | ICD-10-CM

## 2019-01-29 DIAGNOSIS — M47816 Spondylosis without myelopathy or radiculopathy, lumbar region: Secondary | ICD-10-CM

## 2019-01-29 DIAGNOSIS — M7918 Myalgia, other site: Secondary | ICD-10-CM

## 2019-01-29 DIAGNOSIS — M25511 Pain in right shoulder: Secondary | ICD-10-CM

## 2019-01-29 DIAGNOSIS — G8929 Other chronic pain: Secondary | ICD-10-CM

## 2019-01-29 DIAGNOSIS — M25551 Pain in right hip: Secondary | ICD-10-CM | POA: Diagnosis not present

## 2019-01-29 MED ORDER — CYCLOBENZAPRINE HCL 10 MG PO TABS: 10.0000 mg | ORAL_TABLET | Freq: Three times a day (TID) | ORAL | 0 refills | Status: DC | PRN

## 2019-01-29 MED ORDER — OXYCODONE HCL 10 MG PO TABS: 10.0000 mg | ORAL_TABLET | Freq: Four times a day (QID) | ORAL | 0 refills | Status: DC | PRN

## 2019-01-29 NOTE — Patient Instructions (Signed)
____________________________________________________________________________________________  Medication Rules  Purpose: To inform patients, and their family members, of our rules and regulations.  Applies to: All patients receiving prescriptions (written or electronic).  Pharmacy of record: Pharmacy where electronic prescriptions will be sent. If written prescriptions are taken to a different pharmacy, please inform the nursing staff. The pharmacy listed in the electronic medical record should be the one where you would like electronic prescriptions to be sent.  Electronic prescriptions: In compliance with the [place] Strengthen Opioid Misuse Prevention (STOP) Act of 2017 (Session Law 2017-74/H243), effective March 13, 2018, all controlled substances must be electronically prescribed. Calling prescriptions to the pharmacy will cease to exist.  Prescription refills: Only during scheduled appointments. Applies to all prescriptions.  NOTE: The following applies primarily to controlled substances (Opioid* Pain Medications).   Patient's responsibilities: 1. Pain Pills: Bring all pain pills to every appointment (except for procedure appointments). 2. Pill Bottles: Bring pills in original pharmacy bottle. Always bring the newest bottle. Bring bottle, even if empty. 3. Medication refills: You are responsible for knowing and keeping track of what medications you take and those you need refilled. The day before your appointment: write a list of all prescriptions that need to be refilled. The day of the appointment: give the list to the admitting nurse. Prescriptions will be written only during appointments. No prescriptions will be written on procedure days. If you forget a medication: it will not be "Called in", "Faxed", or "electronically sent". You will need to get another appointment to get these prescribed. No early refills. Do not call asking to have your prescription filled  early. 4. Prescription Accuracy: You are responsible for carefully inspecting your prescriptions before leaving our office. Have the discharge nurse carefully go over each prescription with you, before taking them home. Make sure that your name is accurately spelled, that your address is correct. Check the name and dose of your medication to make sure it is accurate. Check the number of pills, and the written instructions to make sure they are clear and accurate. Make sure that you are given enough medication to last until your next medication refill appointment. 5. Taking Medication: Take medication as prescribed. When it comes to controlled substances, taking less pills or less frequently than prescribed is permitted and encouraged. Never take more pills than instructed. Never take medication more frequently than prescribed.  6. Inform other Doctors: Always inform, all of your healthcare providers, of all the medications you take. 7. Pain Medication from other Providers: You are not allowed to accept any additional pain medication from any other Doctor or Healthcare provider. There are two exceptions to this rule. (see below) In the event that you require additional pain medication, you are responsible for notifying us, as stated below. 8. Medication Agreement: You are responsible for carefully reading and following our Medication Agreement. This must be signed before receiving any prescriptions from our practice. Safely store a copy of your signed Agreement. Violations to the Agreement will result in no further prescriptions. (Additional copies of our Medication Agreement are available upon request.) 9. Laws, Rules, & Regulations: All patients are expected to follow all Federal and State Laws, Statutes, Rules, & Regulations. Ignorance of the Laws does not constitute a valid excuse. The use of any illegal substances is prohibited. 10. Adopted CDC guidelines & recommendations: Target dosing levels will be  at or below 60 MME/day. Use of benzodiazepines** is not recommended.  Exceptions: There are only two exceptions to the rule of not   receiving pain medications from other Healthcare Providers. 1. Exception #1 (Emergencies): In the event of an emergency (i.e.: accident requiring emergency care), you are allowed to receive additional pain medication. However, you are responsible for: As soon as you are able, call our office [phone], at any time of the day or night, and leave a message stating your name, the date and nature of the emergency, and the name and dose of the medication prescribed. In the event that your call is answered by a member of our staff, make sure to document and save the date, time, and the name of the person that took your information.  2. Exception #2 (Planned Surgery): In the event that you are scheduled by another doctor or dentist to have any type of surgery or procedure, you are allowed (for a period no longer than 30 days), to receive additional pain medication, for the acute post-op pain. However, in this case, you are responsible for picking up a copy of our "Post-op Pain Management for Surgeons" handout, and giving it to your surgeon or dentist. This document is available at our office, and does not require an appointment to obtain it. Simply go to our office during business hours (Monday-Thursday from 8:00 AM to 4:00 PM) (Friday 8:00 AM to 12:00 Noon) or if you have a scheduled appointment with us, prior to your surgery, and ask for it by name. In addition, you will need to provide us with your name, name of your surgeon, type of surgery, and date of procedure or surgery.  *Opioid medications include: morphine, codeine, oxycodone, oxymorphone, hydrocodone, hydromorphone, meperidine, tramadol, tapentadol, buprenorphine, fentanyl, methadone. **Benzodiazepine medications include: diazepam (Valium), alprazolam (Xanax), clonazepam (Klonopine), lorazepam (Ativan), clorazepate  (Tranxene), chlordiazepoxide (Librium), estazolam (Prosom), oxazepam (Serax), temazepam (Restoril), triazolam (Halcion) (Last updated: 05/10/2017) ____________________________________________________________________________________________   ____________________________________________________________________________________________  Medication Recommendations and Reminders  Applies to: All patients receiving prescriptions (written and/or electronic).  Medication Rules & Regulations: These rules and regulations exist for your safety and that of others. They are not flexible and neither are we. Dismissing or ignoring them will be considered "non-compliance" with medication therapy, resulting in complete and irreversible termination of such therapy. (See document titled "Medication Rules" for more details.) In all conscience, because of safety reasons, we cannot continue providing a therapy where the patient does not follow instructions.  Pharmacy of record:   Definition: This is the pharmacy where your electronic prescriptions will be sent.   We do not endorse any particular pharmacy.  You are not restricted in your choice of pharmacy.  The pharmacy listed in the electronic medical record should be the one where you want electronic prescriptions to be sent.  If you choose to change pharmacy, simply notify our nursing staff of your choice of new pharmacy.  Recommendations:  Keep all of your pain medications in a safe place, under lock and key, even if you live alone.   After you fill your prescription, take 1 week's worth of pills and put them away in a safe place. You should keep a separate, properly labeled bottle for this purpose. The remainder should be kept in the original bottle. Use this as your primary supply, until it runs out. Once it's gone, then you know that you have 1 week's worth of medicine, and it is time to come in for a prescription refill. If you do this correctly, it  is unlikely that you will ever run out of medicine.  To make sure that the above recommendation works,   it is very important that you make sure your medication refill appointments are scheduled at least 1 week before you run out of medicine. To do this in an effective manner, make sure that you do not leave the office without scheduling your next medication management appointment. Always ask the nursing staff to show you in your prescription , when your medication will be running out. Then arrange for the receptionist to get you a return appointment, at least 7 days before you run out of medicine. Do not wait until you have 1 or 2 pills left, to come in. This is very poor planning and does not take into consideration that we may need to cancel appointments due to bad weather, sickness, or emergencies affecting our staff.  "Partial Fill": If for any reason your pharmacy does not have enough pills/tablets to completely fill or refill your prescription, do not allow for a "partial fill". You will need a separate prescription to fill the remaining amount, which we will not provide. If the reason for the partial fill is your insurance, you will need to talk to the pharmacist about payment alternatives for the remaining tablets, but again, do not accept a partial fill.  Prescription refills and/or changes in medication(s):   Prescription refills, and/or changes in dose or medication, will be conducted only during scheduled medication management appointments. (Applies to both, written and electronic prescriptions.)  No refills on procedure days. No medication will be changed or started on procedure days. No changes, adjustments, and/or refills will be conducted on a procedure day. Doing so will interfere with the diagnostic portion of the procedure.  No phone refills. No medications will be "called into the pharmacy".  No Fax refills.  No weekend refills.  No Holliday refills.  No after hours  refills.  Remember:  Business hours are:  Monday to Thursday 8:00 AM to 4:00 PM Provider's Schedule: Eliu Batch, MD - Appointments are:  Medication management: Monday and Wednesday 8:00 AM to 4:00 PM Procedure day: Tuesday and Thursday 7:30 AM to 4:00 PM Bilal Lateef, MD - Appointments are:  Medication management: Tuesday and Thursday 8:00 AM to 4:00 PM Procedure day: Monday and Wednesday 7:30 AM to 4:00 PM (Last update: 05/10/2017) ____________________________________________________________________________________________   ____________________________________________________________________________________________  CANNABIDIOL (AKA: CBD Oil or Pills)  Applies to: All patients receiving prescriptions of controlled substances (written and/or electronic).  General Information: Cannabidiol (CBD) was discovered in 1940. It is one of some 113 identified cannabinoids in cannabis (Marijuana) plants, accounting for up to 40% of the plant's extract. As of 2018, preliminary clinical research on cannabidiol included studies of anxiety, cognition, movement disorders, and pain.  Cannabidiol is consummed in multiple ways, including inhalation of cannabis smoke or vapor, as an aerosol spray into the cheek, and by mouth. It may be supplied as CBD oil containing CBD as the active ingredient (no added tetrahydrocannabinol (THC) or terpenes), a full-plant CBD-dominant hemp extract oil, capsules, dried cannabis, or as a liquid solution. CBD is thought not have the same psychoactivity as THC, and may affect the actions of THC. Studies suggest that CBD may interact with different biological targets, including cannabinoid receptors and other neurotransmitter receptors. As of 2018 the mechanism of action for its biological effects has not been determined.  In the [place], cannabidiol has a limited approval by the Food and Drug Administration (FDA) for treatment of only two types of epilepsy  disorders. The side effects of long-term use of the drug include somnolence, decreased appetite, diarrhea,   fatigue, malaise, weakness, sleeping problems, and others.  CBD remains a Schedule I drug prohibited for any use.  Legality: Some manufacturers ship CBD products nationally, an illegal action which the FDA has not enforced in 2018, with CBD remaining the subject of an FDA investigational new drug evaluation, and is not considered legal as a dietary supplement or food ingredient as of December 2018. Federal illegality has made it difficult historically to conduct research on CBD. CBD is openly sold in head shops and health food stores in some states where such sales have not been explicitly legalized.  Warning: Because it is not FDA approved for general use or treatment of pain, it is not required to undergo the same manufacturing controls as prescription drugs.  This means that the available cannabidiol (CBD) may be contaminated with THC.  If this is the case, it will trigger a positive urine drug screen (UDS) test for cannabinoids (Marijuana).  Because a positive UDS for illicit substances is a violation of our medication agreement, your opioid analgesics (pain medicine) may be permanently discontinued. (Last update: 05/31/2017) ____________________________________________________________________________________________    

## 2019-02-05 ENCOUNTER — Other Ambulatory Visit: Payer: Self-pay

## 2019-02-05 ENCOUNTER — Ambulatory Visit: Payer: Medicare Other | Admitting: *Deleted

## 2019-02-05 DIAGNOSIS — F331 Major depressive disorder, recurrent, moderate: Secondary | ICD-10-CM

## 2019-02-05 DIAGNOSIS — Z598 Other problems related to housing and economic circumstances: Secondary | ICD-10-CM

## 2019-02-05 DIAGNOSIS — Z599 Problem related to housing and economic circumstances, unspecified: Secondary | ICD-10-CM

## 2019-02-05 NOTE — Chronic Care Management (AMB) (Signed)
Chronic Care Management    Clinical Social Work Follow Up Note  02/05/2019 Name: [name] MRN: [id number] DOB: Sep 28, 1960  [name] is a 58 y.o. year old female who is a primary care patient of Bacigalupo, Dionne Bucy, MD. The CCM team was consulted for assistance with Mental Health Counseling and Resources.   Review of patient status, including review of consultants reports, other relevant assessments, and collaboration with appropriate care team members and the patient's provider was performed as part of comprehensive patient evaluation and provision of chronic care management services.    Advanced Directives Status: <no information> See Care Plan for related entries.   Outpatient Encounter Medications as of 02/05/2019  Medication Sig  . acetaminophen (TYLENOL) 500 MG tablet Take 500 mg by mouth every 6 (six) hours as needed.  Marland Kitchen albuterol (PROVENTIL HFA) 108 (90 Base) MCG/ACT inhaler Inhale 1-2 puffs into the lungs every 4 (four) hours as needed.   . ALPRAZolam (XANAX) 1 MG tablet Take 1 mg by mouth at bedtime as needed for anxiety.  . calcium carbonate (TUMS - DOSED IN MG ELEMENTAL CALCIUM) 500 MG chewable tablet Chew 1 tablet by mouth 3 (three) times daily with meals. As needed  . [START ON 04/14/2019] cyclobenzaprine (FLEXERIL) 10 MG tablet Take 1 tablet (10 mg total) by mouth 3 (three) times daily as needed for muscle spasms (after procedures).  Marland Kitchen dexlansoprazole (DEXILANT) 60 MG capsule Take 1 capsule (60 mg total) by mouth daily. **PLEASE SCHEDULE FOLLOW UP APPT**  . Docusate Sodium (COLACE PO) Take by mouth daily.   . Fluticasone-Salmeterol (ADVAIR) 500-50 MCG/DOSE AEPB Inhale 1 puff into the lungs 2 (two) times daily. As needed  . metoprolol succinate (TOPROL-XL) 50 MG 24 hr tablet TAKE 1 TABLET BY MOUTH EVERY DAY  . montelukast (SINGULAIR) 10 MG tablet TAKE 1 TABLET BY MOUTH EVERY DAY  . ondansetron (ZOFRAN) 4 MG tablet TAKE 1 TABLET BY MOUTH EVERY 8 HOURS AS NEEDED FOR  NAUSEA AND VOMITING  . oxyCODONE (OXY IR/ROXICODONE) 5 MG immediate release tablet Take 1 tablet (5 mg total) by mouth every 6 (six) hours as needed for severe pain. Must last 30 days  . [START ON 02/25/2019] oxyCODONE 10 MG TABS Take 1 tablet (10 mg total) by mouth every 6 (six) hours as needed for severe pain. Must last 30 days  . [START ON 03/27/2019] oxyCODONE 10 MG TABS Take 1 tablet (10 mg total) by mouth every 6 (six) hours as needed for severe pain. Must last 30 days  . [START ON 04/26/2019] oxyCODONE 10 MG TABS Take 1 tablet (10 mg total) by mouth every 6 (six) hours as needed for severe pain. Must last 30 days  . Spacer/Aero-Holding Chambers (AEROCHAMBER MV) inhaler by Does not apply route.  . valACYclovir (VALTREX) 1000 MG tablet TAKE TWO TABLETS BY MOUTH TWICE DAILY FOR ONE DAY FEVER BLISTER   No facility-administered encounter medications on file as of 02/05/2019.      Goals Addressed            This Visit's Progress   . "I am dealing with so much stress with my son and grandchildren" (pt-stated)       Current Barriers:  . Chronic Mental Health needs related to anxiety and depression . Mental Health Concerns  . Family and relationship dysfunction . Lacks knowledge of community resource: community resources to assist with financial difficulties . Suicidal Ideation/Homicidal Ideation: No  Clinical Social Work Goal(s):  Marland Kitchen Over the next  90 days, patient will work with SW bi-weekly by telephone or in person to reduce or manage symptoms of anxiety and depression related to her parenting responsibilities of her 3 grandchildren and dysfunctional relationship with her son   Interventions:  . Relationship with her son explored, which remains conflictual discussed importance of a united front with spouse when dealing with son's manipulative behaviors . Explored progress of managing home schooling with her 3 grandchildren, with patient acknowledging some improvement due to setting  firm boundaries and providing incentives acknowledging that it is a daily struggle. Grateful for Thanksgiving Break . Positive reinforcement provided regarding willingness to participate in support groups for additional support  . Provided contact information on [facility] [phone] www. [email or web address] and [facility]ising Grandchildren meets every 3rd Wednesday at 11 am at the [facility]  Patient Self Care Activities:  . Performs ADL's independently . Performs IADL's independently . Ability for insight  Patient Coping Strengths:  . Family . Able to Communicate Effectively  Patient Self Care Deficits:  . Difficulties in establishing and maintaining boundaries with her adult son  Please see past updates related to this goal by clicking on the "Past Updates" button in the selected goal          Follow Up Plan: SW will follow up with patient by phone over the next 2 weeks    [place], [place] Worker  [facility] Care Management [phone]

## 2019-02-05 NOTE — Patient Instructions (Signed)
Thank you allowing the Chronic Care Management Team to be a part of your care! It was a pleasure speaking with you today!  1. Please call this social worker with any questions or concerns regarding your mental health or community resource needs  CCM (Chronic Care Management) Team   Neldon Labella RN, BSN Nurse Care Coordinator  [phone]  Ruben Reason PharmD  Clinical Pharmacist  [phone]   [place], [place] Social Worker [phone]  Goals Addressed            This Visit's Progress   . "I am dealing with so much stress with my son and grandchildren" (pt-stated)       Current Barriers:  . Chronic Mental Health needs related to anxiety and depression . Mental Health Concerns  . Family and relationship dysfunction . Lacks knowledge of community resource: community resources to assist with financial difficulties . Suicidal Ideation/Homicidal Ideation: No  Clinical Social Work Goal(s):  Marland Kitchen Over the next  90 days, patient will work with SW bi-weekly by telephone or in person to reduce or manage symptoms of anxiety and depression related to her parenting responsibilities of her 3 grandchildren and dysfunctional relationship with her son   Interventions:  . Relationship with her son explored, which remains conflictual discussed importance of a united front with spouse when dealing with son's manipulative behaviors . Explored progress of managing home schooling with her 3 grandchildren, with patient acknowledging some improvement due to setting firm boundaries and providing incentives acknowledging that it is a daily struggle. Grateful for Thanksgiving Break . Positive reinforcement provided regarding willingness to participate in support groups for additional support  . Provided contact information on [facility] [phone] www. [email or web address] and [facility] Raising Grandchildren meets every 3rd Wednesday at 11 am at the [place]  Patient  Self Care Activities:  . Performs ADL's independently . Performs IADL's independently . Ability for insight  Patient Coping Strengths:  . Family . Able to Communicate Effectively  Patient Self Care Deficits:  . Difficulties in establishing and maintaining boundaries with her adult son  Please see past updates related to this goal by clicking on the "Past Updates" button in the selected goal          The patient verbalized understanding of instructions provided today and declined a print copy of patient instruction materials.   Telephone follow up appointment with care management team member scheduled for:02/19/19

## 2019-02-11 ENCOUNTER — Ambulatory Visit
Admission: RE | Admit: 2019-02-11 | Discharge: 2019-02-11 | Disposition: A | Discharge status: Home / Self Care | Payer: Medicare Other | Source: Ambulatory Visit | Attending: Family Medicine | Admitting: Family Medicine

## 2019-02-11 ENCOUNTER — Other Ambulatory Visit: Payer: Self-pay

## 2019-02-11 DIAGNOSIS — Z1231 Encounter for screening mammogram for malignant neoplasm of breast: Secondary | ICD-10-CM | POA: Diagnosis not present

## 2019-02-12 ENCOUNTER — Telehealth: Payer: Self-pay

## 2019-02-12 NOTE — Telephone Encounter (Signed)
Patient advised as below.  

## 2019-02-12 NOTE — Telephone Encounter (Signed)
-----   Message from Virginia Crews, MD sent at 02/11/2019  4:12 PM EST ----- Normal mammogram. Repeat in 1 yr

## 2019-02-19 ENCOUNTER — Ambulatory Visit: Payer: Self-pay | Admitting: *Deleted

## 2019-02-19 ENCOUNTER — Telehealth: Payer: Self-pay

## 2019-02-19 DIAGNOSIS — F331 Major depressive disorder, recurrent, moderate: Secondary | ICD-10-CM

## 2019-02-19 DIAGNOSIS — Z598 Other problems related to housing and economic circumstances: Secondary | ICD-10-CM

## 2019-02-19 DIAGNOSIS — Z599 Problem related to housing and economic circumstances, unspecified: Secondary | ICD-10-CM

## 2019-02-19 NOTE — Chronic Care Management (AMB) (Signed)
Chronic Care Management    Clinical Social Work Follow Up Note  02/19/2019 Name: [name] MRN: [id number] DOB: [birth year]  [name] is a 58 y.o. year old female who is a primary care patient of Bacigalupo, Dionne Bucy, MD. The CCM team was consulted for assistance with [employer] .   Review of patient status, including review of consultants reports, other relevant assessments, and collaboration with appropriate care team members and the patient's provider was performed as part of comprehensive patient evaluation and provision of chronic care management services.     Advanced Directives Status: <no information> See Care Plan for related entries.   Outpatient Encounter Medications as of 02/19/2019  Medication Sig  . acetaminophen (TYLENOL) 500 MG tablet Take 500 mg by mouth every 6 (six) hours as needed.  Marland Kitchen albuterol (PROVENTIL HFA) 108 (90 Base) MCG/ACT inhaler Inhale 1-2 puffs into the lungs every 4 (four) hours as needed.   . ALPRAZolam (XANAX) 1 MG tablet Take 1 mg by mouth at bedtime as needed for anxiety.  . calcium carbonate (TUMS - DOSED IN MG ELEMENTAL CALCIUM) 500 MG chewable tablet Chew 1 tablet by mouth 3 (three) times daily with meals. As needed  . [START ON 04/14/2019] cyclobenzaprine (FLEXERIL) 10 MG tablet Take 1 tablet (10 mg total) by mouth 3 (three) times daily as needed for muscle spasms (after procedures).  Marland Kitchen dexlansoprazole (DEXILANT) 60 MG capsule Take 1 capsule (60 mg total) by mouth daily. **PLEASE SCHEDULE FOLLOW UP APPT**  . Docusate Sodium (COLACE PO) Take by mouth daily.   . Fluticasone-Salmeterol (ADVAIR) 500-50 MCG/DOSE AEPB Inhale 1 puff into the lungs 2 (two) times daily. As needed  . metoprolol succinate (TOPROL-XL) 50 MG 24 hr tablet TAKE 1 TABLET BY MOUTH EVERY DAY  . montelukast (SINGULAIR) 10 MG tablet TAKE 1 TABLET BY MOUTH EVERY DAY  . ondansetron (ZOFRAN) 4 MG tablet TAKE 1 TABLET BY MOUTH EVERY 8 HOURS AS NEEDED FOR NAUSEA AND  VOMITING  . oxyCODONE (OXY IR/ROXICODONE) 5 MG immediate release tablet Take 1 tablet (5 mg total) by mouth every 6 (six) hours as needed for severe pain. Must last 30 days  . [START ON 02/25/2019] oxyCODONE 10 MG TABS Take 1 tablet (10 mg total) by mouth every 6 (six) hours as needed for severe pain. Must last 30 days  . [START ON 03/27/2019] oxyCODONE 10 MG TABS Take 1 tablet (10 mg total) by mouth every 6 (six) hours as needed for severe pain. Must last 30 days  . [START ON 04/26/2019] oxyCODONE 10 MG TABS Take 1 tablet (10 mg total) by mouth every 6 (six) hours as needed for severe pain. Must last 30 days  . Spacer/Aero-Holding Chambers (AEROCHAMBER MV) inhaler by Does not apply route.  . valACYclovir (VALTREX) 1000 MG tablet TAKE TWO TABLETS BY MOUTH TWICE DAILY FOR ONE DAY FEVER BLISTER   No facility-administered encounter medications on file as of 02/19/2019.      Goals Addressed            This Visit's Progress   . "I am dealing with so much stress with my son and grandchildren" (pt-stated)       Current Barriers:  . Chronic Mental Health needs related to anxiety and depression . Mental Health Concerns  . Family and relationship dysfunction . Lacks knowledge of community resource: community resources to assist with financial difficulties . Suicidal Ideation/Homicidal Ideation: No  Clinical Social Work Goal(s):  Marland Kitchen Over the next  90  days, patient will work with SW bi-weekly by telephone or in person to reduce or manage symptoms of anxiety and depression related to her parenting responsibilities of her 3 grandchildren and dysfunctional relationship with her son   Interventions:  . Relationship with her son explored, which remains conflictual . Explored progress of managing home schooling with her 3 grandchildren, with patient acknowledging no new improvements . Per patient, experience remains the same . Followed up on information provided  on [place] [phone] www. [email or web address]  and Grandparents Raising Grandchildren meets every 3rd Wednesday at 11 am at the [facility] [phone] . Patient confirmed that she has not contacted the resources provided yet, stating that she is now dealing with the stress of being down to 1 car . Patient encouraged to contact resources provided for added support  Patient Self Care Activities:  . Performs ADL's independently . Performs IADL's independently . Ability for insight  Patient Coping Strengths:  . Family . Able to Communicate Effectively  Patient Self Care Deficits:  . Difficulties in establishing and maintaining boundaries with her adult son  Please see past updates related to this goal by clicking on the "Past Updates" button in the selected goal          Follow Up Plan: Appointment scheduled for SW follow up with client by phone on: 03/17/19    Elliot Gurney, [place] Worker  [facility] Care Management [phone]

## 2019-02-19 NOTE — Telephone Encounter (Signed)
Advised pt of mammogram results.   Thanks,   [name]    Copied from [place] [id number]. Topic: General - Call Back - No Documentation >> Feb 19, 2019  3:57 PM Erick Blinks wrote: Pt called requesting a call back to discuss mammogram results Best contact: [phone]

## 2019-02-19 NOTE — Patient Instructions (Signed)
Thank you allowing the Chronic Care Management Team to be a part of your care! It was a pleasure speaking with you today!  1. Please call the resources provided related to [place] and grandparent support groups for added support  CCM (Chronic Care Management) Team   Neldon Labella  RN, BSN Nurse Care Coordinator  [phone]  Ruben Reason PharmD  Clinical Pharmacist  [phone]   Dante, LCSW Clinical Social Worker [phone]  Goals Addressed            This Visit's Progress   . "I am dealing with so much stress with my son and grandchildren" (pt-stated)       Current Barriers:  . Chronic Mental Health needs related to anxiety and depression . Mental Health Concerns  . Family and relationship dysfunction . Lacks knowledge of community resource: community resources to assist with financial difficulties . Suicidal Ideation/Homicidal Ideation: No  Clinical Social Work Goal(s):  Marland Kitchen Over the next  90 days, patient will work with SW bi-weekly by telephone or in person to reduce or manage symptoms of anxiety and depression related to her parenting responsibilities of her 3 grandchildren and dysfunctional relationship with her son   Interventions:  . Relationship with her son explored, which remains conflictual . Explored progress of managing home schooling with her 3 grandchildren, with patient acknowledging no new improvements . Per patient, experience remains the same . Followed up on information provided  on [employer] [phone] www. [email or web address] and Grandparents Raising Grandchildren meets every 3rd Wednesday at 11 am at the [facility] [phone] . Patient confirmed that she has not contacted the resources provided yet, stating that she is now dealing with the stress of being down to 1 car . Patient encouraged to contact resources provided for added support  Patient Self Care Activities:  . Performs ADL's independently . Performs IADL's  independently . Ability for insight  Patient Coping Strengths:  . Family . Able to Communicate Effectively  Patient Self Care Deficits:  . Difficulties in establishing and maintaining boundaries with her adult son  Please see past updates related to this goal by clicking on the "Past Updates" button in the selected goal          The patient verbalized understanding of instructions provided today and declined a print copy of patient instruction materials.   Telephone follow up appointment with care management team member scheduled for:03/17/19

## 2019-03-01 ENCOUNTER — Other Ambulatory Visit: Payer: Self-pay | Admitting: Gastroenterology

## 2019-03-03 NOTE — Telephone Encounter (Signed)
Last office visit 03/21/17 Pharyngeal dysphagia

## 2019-03-10 ENCOUNTER — Other Ambulatory Visit: Payer: Self-pay | Admitting: Family Medicine

## 2019-03-10 MED ORDER — ALPRAZOLAM 1 MG PO TABS: 1.0000 mg | ORAL_TABLET | Freq: Every evening | ORAL | 0 refills | Status: DC | PRN

## 2019-03-10 NOTE — Telephone Encounter (Signed)
Please review for Dr. B ° ° °Thanks,  ° °-Kenedie Dirocco  °

## 2019-03-10 NOTE — Telephone Encounter (Signed)
Medication Refill - Medication: ALPRAZolam (XANAX) 1 MG tablet  Has the patient contacted their pharmacy? Yes - no longer seeing psychiatrist that was RXing this and wants to know if PCP will (Agent: If no, request that the patient contact the pharmacy for the refill.) (Agent: If yes, when and what did the pharmacy advise?)  Preferred Pharmacy (with phone number or street name):  [id number] - [place], [place] [facility] [address] Phone:  [phone]  Fax:  [phone]     Agent: Please be advised that RX refills may take up to 3 business days. We ask that you follow-up with your pharmacy.

## 2019-03-10 NOTE — Telephone Encounter (Signed)
Requested medication (s) are due for refill today: no  Requested medication (s) are on the active medication list: yes   Future visit scheduled: yes  Notes to clinic: last filled by historical provider  Review for refill  Requested Prescriptions  Pending Prescriptions Disp Refills   ALPRAZolam (XANAX) 1 MG tablet 30 tablet     Sig: Take 1 tablet (1 mg total) by mouth at bedtime as needed for anxiety.      Not Delegated - Psychiatry:  Anxiolytics/Hypnotics Failed - 03/10/2019  9:55 AM      Failed - This refill cannot be delegated      Failed - Urine Drug Screen completed in last 360 days.      Passed - Valid encounter within last 6 months    Recent Outpatient Visits           2 months ago Essential hypertension   [place], Dionne Bucy, MD   7 months ago Lumbar spondylosis   [facility] [place] Malling M, [place]   10 months ago Cough   [facility] [place], Dionne Bucy, MD   1 year ago Cystitis with hematuria   [facility] [place], Dionne Bucy, MD   1 year ago Encounter for annual physical exam   [facility] Bacigalupo, Dionne Bucy, MD       Future Appointments             In 2 weeks Lucilla Lame, MD [place]   In 2 months Milinda Pointer, MD [place]   In 3 months Bacigalupo, Dionne Bucy, MD [facility], [place]

## 2019-03-14 DIAGNOSIS — I480 Paroxysmal atrial fibrillation: Secondary | ICD-10-CM

## 2019-03-14 DIAGNOSIS — I4891 Unspecified atrial fibrillation: Secondary | ICD-10-CM

## 2019-03-14 DIAGNOSIS — G4730 Sleep apnea, unspecified: Secondary | ICD-10-CM

## 2019-03-14 HISTORY — DX: Paroxysmal atrial fibrillation: I48.0

## 2019-03-14 HISTORY — DX: Unspecified atrial fibrillation: I48.91

## 2019-03-14 HISTORY — DX: Sleep apnea, unspecified: G47.30

## 2019-03-17 ENCOUNTER — Ambulatory Visit: Payer: Self-pay | Admitting: *Deleted

## 2019-03-17 NOTE — Chronic Care Management (AMB) (Signed)
  Chronic Care Management   Social Work Note  03/17/2019 Name: [name] MRN: [id number] DOB: Jan 29, 1961  [name] is a 59 y.o. year old female who sees Bacigalupo, Dionne Bucy, MD for primary care. The CCM team was consulted for assistance with [employer] .   Phone call to patient to follow up on community resource needs. Patient requested a call back next week as she was visiting her daughter at the time.  Outpatient Encounter Medications as of 03/17/2019  Medication Sig  . acetaminophen (TYLENOL) 500 MG tablet Take 500 mg by mouth every 6 (six) hours as needed.  Marland Kitchen albuterol (PROVENTIL HFA) 108 (90 Base) MCG/ACT inhaler Inhale 1-2 puffs into the lungs every 4 (four) hours as needed.   . ALPRAZolam (XANAX) 1 MG tablet Take 1 tablet (1 mg total) by mouth at bedtime as needed for anxiety.  . calcium carbonate (TUMS - DOSED IN MG ELEMENTAL CALCIUM) 500 MG chewable tablet Chew 1 tablet by mouth 3 (three) times daily with meals. As needed  . [START ON 04/14/2019] cyclobenzaprine (FLEXERIL) 10 MG tablet Take 1 tablet (10 mg total) by mouth 3 (three) times daily as needed for muscle spasms (after procedures).  Marland Kitchen dexlansoprazole (DEXILANT) 60 MG capsule Take 1 capsule (60 mg total) by mouth daily. **PLEASE SCHEDULE FOLLOW UP APPT**  . Docusate Sodium (COLACE PO) Take by mouth daily.   . Fluticasone-Salmeterol (ADVAIR) 500-50 MCG/DOSE AEPB Inhale 1 puff into the lungs 2 (two) times daily. As needed  . metoprolol succinate (TOPROL-XL) 50 MG 24 hr tablet TAKE 1 TABLET BY MOUTH EVERY DAY  . montelukast (SINGULAIR) 10 MG tablet TAKE 1 TABLET BY MOUTH EVERY DAY  . ondansetron (ZOFRAN) 4 MG tablet TAKE 1 TABLET BY MOUTH EVERY 8 HOURS AS NEEDED FOR NAUSEA AND VOMITING  . oxyCODONE (OXY IR/ROXICODONE) 5 MG immediate release tablet Take 1 tablet (5 mg total) by mouth every 6 (six) hours as needed for severe pain. Must last 30 days  . oxyCODONE 10 MG TABS Take 1 tablet (10 mg total) by mouth  every 6 (six) hours as needed for severe pain. Must last 30 days  . [START ON 03/27/2019] oxyCODONE 10 MG TABS Take 1 tablet (10 mg total) by mouth every 6 (six) hours as needed for severe pain. Must last 30 days  . [START ON 04/26/2019] oxyCODONE 10 MG TABS Take 1 tablet (10 mg total) by mouth every 6 (six) hours as needed for severe pain. Must last 30 days  . Spacer/Aero-Holding Chambers (AEROCHAMBER MV) inhaler by Does not apply route.  . valACYclovir (VALTREX) 1000 MG tablet TAKE TWO TABLETS BY MOUTH TWICE DAILY FOR ONE DAY FEVER BLISTER   No facility-administered encounter medications on file as of 03/17/2019.    Goals Addressed   None     Follow Up Plan: SW will follow up with patient by phone over the next 2 weeks   [name], [place] Worker  [address] Management [phone]

## 2019-03-18 ENCOUNTER — Encounter: Payer: Self-pay | Admitting: *Deleted

## 2019-03-25 ENCOUNTER — Other Ambulatory Visit: Payer: Self-pay

## 2019-03-25 ENCOUNTER — Encounter: Payer: Self-pay | Admitting: Gastroenterology

## 2019-03-25 ENCOUNTER — Other Ambulatory Visit: Payer: Self-pay | Admitting: Family Medicine

## 2019-03-25 ENCOUNTER — Ambulatory Visit: Payer: Medicare Other | Admitting: Gastroenterology

## 2019-03-25 VITALS — BP 147/85 | HR 56 | Temp 97.9°F | Ht 64.0 in | Wt 158.6 lb

## 2019-03-25 DIAGNOSIS — K219 Gastro-esophageal reflux disease without esophagitis: Secondary | ICD-10-CM | POA: Diagnosis not present

## 2019-03-25 DIAGNOSIS — K5900 Constipation, unspecified: Secondary | ICD-10-CM | POA: Diagnosis not present

## 2019-03-25 MED ORDER — PANTOPRAZOLE SODIUM 40 MG PO TBEC: 40.0000 mg | DELAYED_RELEASE_TABLET | Freq: Two times a day (BID) | ORAL | 6 refills | Status: DC

## 2019-03-25 NOTE — Telephone Encounter (Signed)
Requested medication (s) are due for refill today: yes  Requested medication (s) are on the active medication list: yes  Last refill:  12/10/2018  Future visit scheduled:yes  Notes to clinic:  This refill cannot be delegated    Requested Prescriptions  Pending Prescriptions Disp Refills   ondansetron (ZOFRAN) 4 MG tablet [Pharmacy Med Name: ONDANSETRON HCL 4 MG TABLET] 30 tablet 0    Sig: TAKE 1 TABLET BY MOUTH EVERY 8 HOURS AS NEEDED FOR NAUSEA AND VOMITING      Not Delegated - Gastroenterology: Antiemetics Failed - 03/25/2019  2:14 PM      Failed - This refill cannot be delegated      Passed - Valid encounter within last 6 months    Recent Outpatient Visits           2 months ago Essential hypertension   [place], Dionne Bucy, MD   8 months ago Lumbar spondylosis   [facility] Fenton Malling M, [place]   10 months ago Cough   [facility] [place], Dionne Bucy, MD   1 year ago Cystitis with hematuria   [facility] [place], Dionne Bucy, MD   1 year ago Encounter for annual physical exam   [facility] Bacigalupo, Dionne Bucy, MD       Future Appointments             In 2 months Milinda Pointer, MD [place]   In 3 months Bacigalupo, Dionne Bucy, MD [facility], [place]

## 2019-03-25 NOTE — Progress Notes (Signed)
Primary Care Physician: Virginia Crews, MD  Primary Gastroenterologist:  Dr. Lucilla Lame  Chief Complaint  Patient presents with  . Abdominal Pain    HPI: [name] is a 59 y.o. female here with GERD and constipation.  The patient reports that she was switched to [place] and states that it is helping her symptoms and she is much worse when she does not take Dexilant but reports that she still has acid breakthrough on a regular basis.  The patient also has regurgitation with food coming up at night as well as burning in her throat.  She states by mid afternoon her voice has become hoarse because of the reflux.  She also reports that she is having some diffuse abdominal discomfort because of the constipation.  She does take Linzess as needed and states then it gives her diarrhea.  The patient reports her stools to be pellet-like.   Current Outpatient Medications  Medication Sig Dispense Refill  . acetaminophen (TYLENOL) 500 MG tablet Take 500 mg by mouth every 6 (six) hours as needed.    Marland Kitchen albuterol (PROVENTIL HFA) 108 (90 Base) MCG/ACT inhaler Inhale 1-2 puffs into the lungs every 4 (four) hours as needed.     . ALPRAZolam (XANAX) 1 MG tablet Take 1 tablet (1 mg total) by mouth at bedtime as needed for anxiety. 30 tablet 0  . buPROPion (WELLBUTRIN) 100 MG tablet Take 100 mg by mouth 2 (two) times daily.    . calcium carbonate (TUMS - DOSED IN MG ELEMENTAL CALCIUM) 500 MG chewable tablet Chew 1 tablet by mouth 3 (three) times daily with meals. As needed    . [START ON 04/14/2019] cyclobenzaprine (FLEXERIL) 10 MG tablet Take 1 tablet (10 mg total) by mouth 3 (three) times daily as needed for muscle spasms (after procedures). 90 tablet 0  . dexlansoprazole (DEXILANT) 60 MG capsule Take 1 capsule (60 mg total) by mouth daily. **PLEASE SCHEDULE FOLLOW UP APPT** 90 capsule 1  . Docusate Sodium (COLACE PO) Take by mouth daily.     . DULoxetine (CYMBALTA) 30 MG capsule Take 30 mg by  mouth daily.    . Fluticasone-Salmeterol (ADVAIR) 500-50 MCG/DOSE AEPB Inhale 1 puff into the lungs 2 (two) times daily. As needed    . metoprolol succinate (TOPROL-XL) 50 MG 24 hr tablet TAKE 1 TABLET BY MOUTH EVERY DAY 90 tablet 1  . montelukast (SINGULAIR) 10 MG tablet TAKE 1 TABLET BY MOUTH EVERY DAY 90 tablet 1  . ondansetron (ZOFRAN) 4 MG tablet TAKE 1 TABLET BY MOUTH EVERY 8 HOURS AS NEEDED FOR NAUSEA AND VOMITING 30 tablet 0  . oxyCODONE 10 MG TABS Take 1 tablet (10 mg total) by mouth every 6 (six) hours as needed for severe pain. Must last 30 days 120 tablet 0  . [START ON 03/27/2019] oxyCODONE 10 MG TABS Take 1 tablet (10 mg total) by mouth every 6 (six) hours as needed for severe pain. Must last 30 days 120 tablet 0  . [START ON 04/26/2019] oxyCODONE 10 MG TABS Take 1 tablet (10 mg total) by mouth every 6 (six) hours as needed for severe pain. Must last 30 days 120 tablet 0  . Spacer/Aero-Holding Chambers (AEROCHAMBER MV) inhaler by Does not apply route.    . valACYclovir (VALTREX) 1000 MG tablet TAKE TWO TABLETS BY MOUTH TWICE DAILY FOR ONE DAY FEVER BLISTER 4 tablet 5  . mirtazapine (REMERON) 7.5 MG tablet Take 7.5 mg by mouth at bedtime.    Marland Kitchen  oxyCODONE (OXY IR/ROXICODONE) 5 MG immediate release tablet Take 1 tablet (5 mg total) by mouth every 6 (six) hours as needed for severe pain. Must last 30 days 120 tablet 0  . pantoprazole (PROTONIX) 40 MG tablet Take 1 tablet (40 mg total) by mouth 2 (two) times daily. 60 tablet 6   No current facility-administered medications for this visit.    Allergies as of 03/25/2019 - Review Complete 03/25/2019  Allergen Reaction Noted  . Amoxicillin Hives 08/20/2014  . Chlorhexidine gluconate Dermatitis and Hives 07/06/2016  . Clindamycin Hives 08/20/2014  . Codeine Hives 08/21/2011  . Erythromycin Hives 07/06/2016  . Penicillin g Hives 07/06/2016  . Sulfa antibiotics Nausea And Vomiting and Hives 09/06/2011  . Levofloxacin Hives 02/18/2018  .  Shellfish allergy Hives 10/08/2012  . Decadron [dexamethasone] Other (See Comments) 09/20/2011  . Mangifera indica Hives 07/20/2016  . Papaya derivatives Hives 10/30/2012  . Betadine [povidone iodine] Hives 09/06/2011  . Chlorhexidine Hives 09/06/2011  . Clarithromycin Hives 09/08/2011  . Clindamycin/lincomycin Hives 04/30/2012  . Other Hives 09/06/2011  . Povidone-iodine Hives 09/06/2011  . Prednisone Anxiety 08/18/2014    ROS:  General: Negative for anorexia, weight loss, fever, chills, fatigue, weakness. ENT: Negative for hoarseness, difficulty swallowing , nasal congestion. CV: Negative for chest pain, angina, palpitations, dyspnea on exertion, peripheral edema.  Respiratory: Negative for dyspnea at rest, dyspnea on exertion, cough, sputum, wheezing.  GI: See history of present illness. GU:  Negative for dysuria, hematuria, urinary incontinence, urinary frequency, nocturnal urination.  Endo: Negative for unusual weight change.    Physical Examination:   BP (!) 147/85   Pulse (!) 56   Temp 97.9 F (36.6 C) (Oral)   Ht 5\' 4"  (1.626 m)   Wt 158 lb 9.6 oz (71.9 kg)   BMI 27.22 kg/m   General: Well-nourished, well-developed in no acute distress.  Eyes: No icterus. Conjunctivae pink. Lungs: Clear to auscultation bilaterally. Non-labored. Heart: Regular rate and rhythm, no murmurs rubs or gallops.  Abdomen: Bowel sounds are normal, nontender, nondistended, no hepatosplenomegaly or masses, no abdominal bruits or hernia , no rebound or guarding.   Extremities: No lower extremity edema. No clubbing or deformities. Neuro: Alert and oriented x 3.  Grossly intact. Skin: Warm and dry, no jaundice.   Psych: Alert and cooperative, normal mood and affect.  Labs:    Imaging Studies: No results found.  Assessment and Plan:   [name] is a 59 y.o. y/o female who comes in today with a history of recurrent heartburn despite being on Dexilant.  She states that the medication  helps her but does not completely resolve her symptoms.  She also has regurgitation and constipation.  The patient will be given samples of Linzess 72 mcg to be taken once a day half hour before meal and the patient has been told to take it every day.  If this is the dose that has been causing her diarrhea, which she is not sure what the dose that she is taking at home is, then she will switch over to the samples of Amitiza I gave her.  She has been told to take the Amitiza 24 mcg twice a day.  The patient has also been switched to Protonix twice a day to see if that helps her symptoms.  If not the patient has been recommended to consider antireflux surgery since her esophageal manometry and pH study showed reflux.     Lucilla Lame, MD. Mar[facility] Regal    Note: This  dictation was prepared with Dragon dictation along with smaller phrase technology. Any transcriptional errors that result from this process are unintentional.

## 2019-03-26 ENCOUNTER — Ambulatory Visit: Payer: Self-pay | Admitting: *Deleted

## 2019-03-26 ENCOUNTER — Ambulatory Visit: Payer: Medicare Other | Admitting: Gastroenterology

## 2019-03-26 DIAGNOSIS — F411 Generalized anxiety disorder: Secondary | ICD-10-CM

## 2019-03-26 DIAGNOSIS — F331 Major depressive disorder, recurrent, moderate: Secondary | ICD-10-CM

## 2019-03-26 NOTE — Chronic Care Management (AMB) (Signed)
Chronic Care Management    Clinical Social Work Follow Up Note  03/26/2019 Name: [name] MRN: [id number] DOB: 1960/08/17  [name] is a 59 y.o. year old female who is a primary care patient of Bacigalupo, Dionne Bucy, MD. The CCM team was consulted for assistance with Mental Health Counseling and Resources.   Review of patient status, including review of consultants reports, other relevant assessments, and collaboration with appropriate care team members and the patient's provider was performed as part of comprehensive patient evaluation and provision of chronic care management services.    Advanced Directives Status: <no information> See Care Plan for related entries.   Outpatient Encounter Medications as of 03/26/2019  Medication Sig  . ondansetron (ZOFRAN) 4 MG tablet TAKE 1 TABLET BY MOUTH EVERY 8 HOURS AS NEEDED FOR NAUSEA AND VOMITING  . acetaminophen (TYLENOL) 500 MG tablet Take 500 mg by mouth every 6 (six) hours as needed.  Marland Kitchen albuterol (PROVENTIL HFA) 108 (90 Base) MCG/ACT inhaler Inhale 1-2 puffs into the lungs every 4 (four) hours as needed.   . ALPRAZolam (XANAX) 1 MG tablet Take 1 tablet (1 mg total) by mouth at bedtime as needed for anxiety.  Marland Kitchen buPROPion (WELLBUTRIN) 100 MG tablet Take 100 mg by mouth 2 (two) times daily.  . calcium carbonate (TUMS - DOSED IN MG ELEMENTAL CALCIUM) 500 MG chewable tablet Chew 1 tablet by mouth 3 (three) times daily with meals. As needed  . [START ON 04/14/2019] cyclobenzaprine (FLEXERIL) 10 MG tablet Take 1 tablet (10 mg total) by mouth 3 (three) times daily as needed for muscle spasms (after procedures).  Marland Kitchen dexlansoprazole (DEXILANT) 60 MG capsule Take 1 capsule (60 mg total) by mouth daily. **PLEASE SCHEDULE FOLLOW UP APPT**  . Docusate Sodium (COLACE PO) Take by mouth daily.   . DULoxetine (CYMBALTA) 30 MG capsule Take 30 mg by mouth daily.  . Fluticasone-Salmeterol (ADVAIR) 500-50 MCG/DOSE AEPB Inhale 1 puff into the lungs 2 (two)  times daily. As needed  . metoprolol succinate (TOPROL-XL) 50 MG 24 hr tablet TAKE 1 TABLET BY MOUTH EVERY DAY  . mirtazapine (REMERON) 7.5 MG tablet Take 7.5 mg by mouth at bedtime.  . montelukast (SINGULAIR) 10 MG tablet TAKE 1 TABLET BY MOUTH EVERY DAY  . oxyCODONE (OXY IR/ROXICODONE) 5 MG immediate release tablet Take 1 tablet (5 mg total) by mouth every 6 (six) hours as needed for severe pain. Must last 30 days  . oxyCODONE 10 MG TABS Take 1 tablet (10 mg total) by mouth every 6 (six) hours as needed for severe pain. Must last 30 days  . [START ON 03/27/2019] oxyCODONE 10 MG TABS Take 1 tablet (10 mg total) by mouth every 6 (six) hours as needed for severe pain. Must last 30 days  . [START ON 04/26/2019] oxyCODONE 10 MG TABS Take 1 tablet (10 mg total) by mouth every 6 (six) hours as needed for severe pain. Must last 30 days  . pantoprazole (PROTONIX) 40 MG tablet Take 1 tablet (40 mg total) by mouth 2 (two) times daily.  Marland Kitchen Spacer/Aero-Holding Chambers (AEROCHAMBER MV) inhaler by Does not apply route.  . valACYclovir (VALTREX) 1000 MG tablet TAKE TWO TABLETS BY MOUTH TWICE DAILY FOR ONE DAY FEVER BLISTER   No facility-administered encounter medications on file as of 03/26/2019.     Goals Addressed            This Visit's Progress   . "I am dealing with so much stress with my son  and grandchildren" (pt-stated)       Current Barriers:  . Chronic Mental Health needs related to anxiety and depression . Mental Health Concerns  . Family and relationship dysfunction . Lacks knowledge of community resource: community resources to assist with financial difficulties . Suicidal Ideation/Homicidal Ideation: No  Clinical Social Work Goal(s):  Marland Kitchen Over the next  90 days, patient will work with SW bi-weekly by telephone or in person to reduce or manage symptoms of anxiety and depression related to her parenting responsibilities of her 3 grandchildren and dysfunctional relationship with her  son   Interventions:  . Continued to explore relationship with her son explored, which remains conflictual . Explored progress of managing home schooling with her 3 grandchildren, with patient acknowledging no new improvements . Per patient, experience remains the same . Followed up on information provided  on [employer] [phone] www. [email or web address] and Grandparents Raising Grandchildren meets every 3rd Wednesday at 11 am at the [facility] [phone] . Patient confirmed that she has not contacted the resources provided . Patient encouraged to contact resources provided for added support . Patient encouraged to utilized resources provided and to call this Education officer, museum with any questions or concerns regarding her mental health or community resource needs.   Patient Self Care Activities:  . Performs ADL's independently . Performs IADL's independently . Ability for insight  Patient Coping Strengths:  . Family . Able to Communicate Effectively  Patient Self Care Deficits:  . Difficulties in establishing and maintaining boundaries with her adult son  Please see past updates related to this goal by clicking on the "Past Updates" button in the selected goal          Follow Up Plan: Client will call this social worker if there are any questions or concerns regardin gyour metnal health needs    Elliot Gurney, [facility] Care Management [phone]

## 2019-03-26 NOTE — Patient Instructions (Signed)
Thank you allowing the Chronic Care Management Team to be a part of your care! It was a pleasure speaking with you today!  1. Please call this social worker if there are any questions or concerns regarding your mental health needs.  CCM (Chronic Care Management) Team   Neldon Labella RN, BSN Nurse Care Coordinator  [phone]  Ruben Reason PharmD  Clinical Pharmacist  [phone]   Elliot Gurney, LCSW Clinical Social Worker [phone]  Goals Addressed            This Visit's Progress   . "I am dealing with so much stress with my son and grandchildren" (pt-stated)       Current Barriers:  . Chronic Mental Health needs related to anxiety and depression . Mental Health Concerns  . Family and relationship dysfunction . Lacks knowledge of community resource: community resources to assist with financial difficulties . Suicidal Ideation/Homicidal Ideation: No  Clinical Social Work Goal(s):  Marland Kitchen Over the next  90 days, patient will work with SW bi-weekly by telephone or in person to reduce or manage symptoms of anxiety and depression related to her parenting responsibilities of her 3 grandchildren and dysfunctional relationship with her son   Interventions:  . Continued to explore relationship with her son explored, which remains conflictual . Explored progress of managing home schooling with her 3 grandchildren, with patient acknowledging no new improvements . Per patient, experience remains the same . Followed up on information provided  on [employer] [phone] www. [email or web address] and Grandparents Raising Grandchildren meets every 3rd Wednesday at 11 am at the [facility] [phone] . Patient confirmed that she has not contacted the resources provided . Patient encouraged to contact resources provided for added support . Patient encouraged to utilized resources provided and to call this Education officer, museum with any questions or concerns regarding her mental health or  community resource needs.   Patient Self Care Activities:  . Performs ADL's independently . Performs IADL's independently . Ability for insight  Patient Coping Strengths:  . Family . Able to Communicate Effectively  Patient Self Care Deficits:  . Difficulties in establishing and maintaining boundaries with her adult son  Please see past updates related to this goal by clicking on the "Past Updates" button in the selected goal          The patient verbalized understanding of instructions provided today and declined a print copy of patient instruction materials.   No further follow up required: patient will call this social worker with any questions or concerns regarding your mental health needs

## 2019-04-07 ENCOUNTER — Other Ambulatory Visit: Payer: Self-pay | Admitting: Family Medicine

## 2019-04-07 NOTE — Telephone Encounter (Signed)
This is filled by her psychiatrist, Sisk.  It was filled by Dr Rosanna Randy while I was out, but as we have discussed in the past, we will not Rx controlled substances.

## 2019-04-07 NOTE — Telephone Encounter (Signed)
Patient advised as below. Patient reports that she has fired her psychiatrist, Dr. Dorann Ou. Patient reports that she does not recall talking to provider about Xanax not being filled. I did ask patient does she have a prefferance on seeing someone new (psychiatrist.)

## 2019-04-08 NOTE — Telephone Encounter (Signed)
Please see phone note from 05/2018.  This has been well discussed with the patient by myself and Dr Dossie Arbour.  If she is no longer seeing psychiatry, then we will need to initiate a Xanax taper.  Dr  Dossie Arbour has discussed with her the rules about benzos with Opioids.

## 2019-04-08 NOTE — Telephone Encounter (Signed)
Pt advised.  She would like to try a different medication to help her sleep that is not a narcotic.  Pt uses [facility] in [place].   Thanks,   -[name]

## 2019-04-09 MED ORDER — TRAZODONE HCL 100 MG PO TABS: 50.0000 mg | ORAL_TABLET | Freq: Every evening | ORAL | 3 refills | Status: DC | PRN

## 2019-04-09 NOTE — Telephone Encounter (Signed)
Pt advised.   Thanks,   -Bascom Biel  

## 2019-04-09 NOTE — Telephone Encounter (Signed)
Can try Trazodone 50-100 mg qhs prn for sleep.

## 2019-04-14 ENCOUNTER — Ambulatory Visit: Payer: Medicare Other

## 2019-04-15 ENCOUNTER — Other Ambulatory Visit: Payer: Self-pay

## 2019-04-15 ENCOUNTER — Encounter: Payer: Self-pay | Admitting: Family Medicine

## 2019-04-15 ENCOUNTER — Ambulatory Visit (INDEPENDENT_AMBULATORY_CARE_PROVIDER_SITE_OTHER): Payer: Medicare Other | Admitting: Family Medicine

## 2019-04-15 VITALS — BP 133/86 | HR 58 | Temp 96.9°F | Resp 16 | Wt 155.0 lb

## 2019-04-15 DIAGNOSIS — R300 Dysuria: Secondary | ICD-10-CM

## 2019-04-15 LAB — POCT URINALYSIS DIPSTICK
Bilirubin, UA: NEGATIVE
Glucose, UA: NEGATIVE
Ketones, UA: NEGATIVE
Leukocytes, UA: NEGATIVE
Nitrite, UA: NEGATIVE
Protein, UA: NEGATIVE
Spec Grav, UA: 1.02 (ref 1.010–1.025)
Urobilinogen, UA: 0.2 E.U./dL
pH, UA: 5 (ref 5.0–8.0)

## 2019-04-15 MED ORDER — CEPHALEXIN 500 MG PO CAPS: 500.0000 mg | ORAL_CAPSULE | Freq: Two times a day (BID) | ORAL | 0 refills | Status: AC

## 2019-04-15 NOTE — Patient Instructions (Signed)
Urinary Tract Infection, Adult A urinary tract infection (UTI) is an infection of any part of the urinary tract. The urinary tract includes:  The kidneys.  The ureters.  The bladder.  The urethra. These organs make, store, and get rid of pee (urine) in the body. What are the causes? This is caused by germs (bacteria) in your genital area. These germs grow and cause swelling (inflammation) of your urinary tract. What increases the risk? You are more likely to develop this condition if:  You have a small, thin tube (catheter) to drain pee.  You cannot control when you pee or poop (incontinence).  You are female, and: ? You use these methods to prevent pregnancy:  A medicine that kills sperm (spermicide).  A device that blocks sperm (diaphragm). ? You have low levels of a female hormone (estrogen). ? You are pregnant.  You have genes that add to your risk.  You are sexually active.  You take antibiotic medicines.  You have trouble peeing because of: ? A prostate that is bigger than normal, if you are female. ? A blockage in the part of your body that drains pee from the bladder (urethra). ? A kidney stone. ? A nerve condition that affects your bladder (neurogenic bladder). ? Not getting enough to drink. ? Not peeing often enough.  You have other conditions, such as: ? Diabetes. ? A weak disease-fighting system (immune system). ? Sickle cell disease. ? Gout. ? Injury of the spine. What are the signs or symptoms? Symptoms of this condition include:  Needing to pee right away (urgently).  Peeing often.  Peeing small amounts often.  Pain or burning when peeing.  Blood in the pee.  Pee that smells bad or not like normal.  Trouble peeing.  Pee that is cloudy.  Fluid coming from the vagina, if you are female.  Pain in the belly or lower back. Other symptoms include:  Throwing up (vomiting).  No urge to eat.  Feeling mixed up (confused).  Being tired  and grouchy (irritable).  A fever.  Watery poop (diarrhea). How is this treated? This condition may be treated with:  Antibiotic medicine.  Other medicines.  Drinking enough water. Follow these instructions at home:  Medicines  Take over-the-counter and prescription medicines only as told by your doctor.  If you were prescribed an antibiotic medicine, take it as told by your doctor. Do not stop taking it even if you start to feel better. General instructions  Make sure you: ? Pee until your bladder is empty. ? Do not hold pee for a long time. ? Empty your bladder after sex. ? Wipe from front to back after pooping if you are a female. Use each tissue one time when you wipe.  Drink enough fluid to keep your pee pale yellow.  Keep all follow-up visits as told by your doctor. This is important. Contact a doctor if:  You do not get better after 1-2 days.  Your symptoms go away and then come back. Get help right away if:  You have very bad back pain.  You have very bad pain in your lower belly.  You have a fever.  You are sick to your stomach (nauseous).  You are throwing up. Summary  A urinary tract infection (UTI) is an infection of any part of the urinary tract.  This condition is caused by germs in your genital area.  There are many risk factors for a UTI. These include having a small, thin   tube to drain pee and not being able to control when you pee or poop.  Treatment includes antibiotic medicines for germs.  Drink enough fluid to keep your pee pale yellow. This information is not intended to replace advice given to you by your health care provider. Make sure you discuss any questions you have with your health care provider. Document Revised: 02/14/2018 Document Reviewed: 09/06/2017 Elsevier Patient Education  2020 Elsevier Inc.  

## 2019-04-15 NOTE — Progress Notes (Signed)
Patient: [name] Female    DOB: 12-21-1960   59 y.o.   MRN: [id number] Visit Date: 04/15/2019  Today's Provider: Lavon Paganini, MD   Chief Complaint  Patient presents with  . Urinary Tract Infection   Subjective:    I Sulibeya S. Dimas, CMA, am acting as scribe for Lavon Paganini, MD.   HPI Urinary Tract Infection: Patient complains of burning with urination, left flank pain and pain in the lower abdomen She has had symptoms for 1 day. Patient also complains of chills. Patient denies fever. Patient does have a history of recurrent UTI.  Patient does have a history of pyelonephritis. Patient reports that she had similar symptoms 3 weeks ago and took OTC medications (that inhibit bacteria growth)     Allergies  Allergen Reactions  . Amoxicillin Hives  . Chlorhexidine Gluconate Dermatitis and Hives  . Clindamycin Hives  . Codeine Hives  . Erythromycin Hives    "mycins" in general  . Penicillin G Hives    "cillins" in general  . Sulfa Antibiotics Nausea And Vomiting and Hives       . Levofloxacin Hives  . Shellfish Allergy Hives  . Decadron [Dexamethasone] Other (See Comments)    Hot flashes, insomnia, "manic" Hot flashes, insomnia, "manic"  . Mangifera Indica Hives    papaya  . Papaya Derivatives Hives  . Betadine [Povidone Iodine] Hives       . Chlorhexidine Hives       . Clarithromycin Hives  . Clindamycin/Lincomycin Hives  . Other Hives     Mango   . Povidone-Iodine Hives  . Prednisone Anxiety    High blood pressure, flushed, mood changes, heart palpitations High blood pressure, flushed, mood changes, heart palpitations     Current Outpatient Medications:  .  acetaminophen (TYLENOL) 500 MG tablet, Take 500 mg by mouth Cooley 6 (six) hours as needed., Disp: , Rfl:  .  albuterol (PROVENTIL HFA) 108 (90 Base) MCG/ACT inhaler, Inhale 1-2 puffs into the lungs Cooley 4 (four) hours as needed. , Disp: , Rfl:  .  buPROPion (WELLBUTRIN) 100 MG  tablet, Take 100 mg by mouth daily. , Disp: , Rfl:  .  calcium carbonate (TUMS - DOSED IN MG ELEMENTAL CALCIUM) 500 MG chewable tablet, Chew 1 tablet by mouth 3 (three) times daily with meals. As needed, Disp: , Rfl:  .  Docusate Sodium (COLACE PO), Take by mouth daily. , Disp: , Rfl:  .  Fluticasone-Salmeterol (ADVAIR) 500-50 MCG/DOSE AEPB, Inhale 1 puff into the lungs 2 (two) times daily. As needed, Disp: , Rfl:  .  metoprolol succinate (TOPROL-XL) 50 MG 24 hr tablet, TAKE 1 TABLET BY MOUTH Cooley DAY, Disp: 90 tablet, Rfl: 1 .  montelukast (SINGULAIR) 10 MG tablet, TAKE 1 TABLET BY MOUTH Cooley DAY, Disp: 90 tablet, Rfl: 1 .  ondansetron (ZOFRAN) 4 MG tablet, TAKE 1 TABLET BY MOUTH Cooley 8 HOURS AS NEEDED FOR NAUSEA AND VOMITING, Disp: 30 tablet, Rfl: 0 .  oxyCODONE 10 MG TABS, Take 1 tablet (10 mg total) by mouth Cooley 6 (six) hours as needed for severe pain. Must last 30 days, Disp: 120 tablet, Rfl: 0 .  pantoprazole (PROTONIX) 40 MG tablet, Take 1 tablet (40 mg total) by mouth 2 (two) times daily., Disp: 60 tablet, Rfl: 6 .  Spacer/Aero-Holding Chambers (AEROCHAMBER MV) inhaler, by Does not apply route., Disp: , Rfl:  .  valACYclovir (VALTREX) 1000 MG tablet, TAKE TWO TABLETS BY MOUTH TWICE  DAILY FOR ONE DAY FEVER BLISTER, Disp: 4 tablet, Rfl: 5 .  oxyCODONE (OXY IR/ROXICODONE) 5 MG immediate release tablet, Take 1 tablet (5 mg total) by mouth Cooley 6 (six) hours as needed for severe pain. Must last 30 days, Disp: 120 tablet, Rfl: 0 .  oxyCODONE 10 MG TABS, Take 1 tablet (10 mg total) by mouth Cooley 6 (six) hours as needed for severe pain. Must last 30 days, Disp: 120 tablet, Rfl: 0 .  [START ON 04/26/2019] oxyCODONE 10 MG TABS, Take 1 tablet (10 mg total) by mouth Cooley 6 (six) hours as needed for severe pain. Must last 30 days, Disp: 120 tablet, Rfl: 0  Review of Systems  Constitutional: Positive for chills. Negative for fatigue.  Respiratory: Negative for cough, chest tightness, shortness of  breath and wheezing.   Cardiovascular: Negative for chest pain and leg swelling.  Genitourinary: Positive for dysuria, flank pain, pelvic pain and urgency. Negative for frequency, hematuria, vaginal bleeding and vaginal discharge.    Social History   Tobacco Use  . Smoking status: Never Smoker  . Smokeless tobacco: Never Used  Substance Use Topics  . Alcohol use: No    Alcohol/week: 0.0 standard drinks      Objective:   BP 133/86 (BP Location: Left Arm, Patient Position: Sitting, Cuff Size: Large)   Pulse (!) 58   Temp (!) 96.9 F (36.1 C) (Temporal)   Resp 16   Wt 155 lb (70.3 kg)   BMI 26.61 kg/m  Vitals:   04/15/19 0859  BP: 133/86  Pulse: (!) 58  Resp: 16  Temp: (!) 96.9 F (36.1 C)  TempSrc: Temporal  Weight: 155 lb (70.3 kg)  Body mass index is 26.61 kg/m.   Physical Exam Vitals reviewed.  Constitutional:      General: She is not in acute distress.    Appearance: Normal appearance. She is not diaphoretic.  HENT:     Head: Normocephalic and atraumatic.  Cardiovascular:     Rate and Rhythm: Normal rate and regular rhythm.     Heart sounds: Normal heart sounds. No murmur.  Pulmonary:     Effort: Pulmonary effort is normal. No respiratory distress.     Breath sounds: Normal breath sounds. No wheezing.  Abdominal:     General: There is no distension.     Palpations: Abdomen is soft.     Tenderness: There is abdominal tenderness (suprapubic). There is no right CVA tenderness, left CVA tenderness, guarding or rebound.  Musculoskeletal:     Right lower leg: No edema.     Left lower leg: No edema.  Skin:    General: Skin is warm and dry.     Findings: No rash.  Neurological:     Mental Status: She is alert and oriented to person, place, and time. Mental status is at baseline.  Psychiatric:        Mood and Affect: Mood normal.        Behavior: Behavior normal.      Results for orders placed or performed in visit on 04/15/19  POCT urinalysis dipstick    Result Value Ref Range   Color, UA yellow    Clarity, UA clear    Glucose, UA Negative Negative   Bilirubin, UA Negative    Ketones, UA Negative    Spec Grav, UA 1.020 1.010 - 1.025   Blood, UA small    pH, UA 5.0 5.0 - 8.0   Protein, UA Negative Negative   Urobilinogen, UA  0.2 0.2 or 1.0 E.U./dL   Nitrite, UA Negative    Leukocytes, UA Negative Negative       Assessment & Plan    1. Dysuria - Symptoms consistent with UTI, but UA relatively benign - the medication that she took that stopped bacterial growth may have contributed to false negative UA -No systemic symptoms or signs of pyelonephritis -Will start treatment with 5day course of Keflex after reviewing previous urine culture and allergies -We will send urine culture to confirm sensitivities -Discussed return precautions  - Urine Culture   Meds ordered this encounter  Medications  . cephALEXin (KEFLEX) 500 MG capsule    Sig: Take 1 capsule (500 mg total) by mouth 2 (two) times daily for 5 days.    Dispense:  10 capsule    Refill:  0     Return if symptoms worsen or fail to improve.   The entirety of the information documented in the History of Present Illness, Review of Systems and Physical Exam were personally obtained by me. Portions of this information were initially documented by Lynford Humphrey, CMA and reviewed by me for thoroughness and accuracy.    Lenzie Montesano, Dionne Bucy, MD MPH [facility]

## 2019-04-17 ENCOUNTER — Telehealth: Payer: Self-pay

## 2019-04-17 LAB — URINE CULTURE: Organism ID, Bacteria: NO GROWTH

## 2019-04-17 NOTE — Telephone Encounter (Signed)
-----   Message from Virginia Crews, MD sent at 04/17/2019  8:46 AM EST ----- No growth on urine culture, but could have false negative like we discussed based on taking medication before the test.  Can finish antibiotics

## 2019-04-17 NOTE — Telephone Encounter (Signed)
Patient advised as below.  

## 2019-04-23 ENCOUNTER — Encounter: Payer: Medicare Other | Admitting: Pain Medicine

## 2019-04-29 ENCOUNTER — Telehealth: Payer: Self-pay

## 2019-04-29 DIAGNOSIS — R002 Palpitations: Secondary | ICD-10-CM

## 2019-04-29 NOTE — Telephone Encounter (Signed)
Copied from [place] [id number]. Topic: Referral - Request for Referral >> Apr 29, 2019  3:58 PM Percell Belt A wrote:   Has patient seen PCP for this complaint? Yes.   *If NO, is insurance requiring patient see PCP for this issue before PCP can refer them? Referral for which specialty: Cardiology   Preferred provider/office: anywhere Dr B would sugguest  Reason for referral: Heart palpations , she stated she has spoke to Dr B about this a couple of months ago and wants to just see a specialist  to get it checked out

## 2019-04-29 NOTE — Telephone Encounter (Signed)
Okay to place referral.  I usually use [facility] in [place]

## 2019-05-02 ENCOUNTER — Other Ambulatory Visit: Payer: Self-pay

## 2019-05-02 ENCOUNTER — Encounter: Payer: Self-pay | Admitting: Cardiology

## 2019-05-02 ENCOUNTER — Ambulatory Visit (INDEPENDENT_AMBULATORY_CARE_PROVIDER_SITE_OTHER): Payer: Medicare Other

## 2019-05-02 ENCOUNTER — Ambulatory Visit (INDEPENDENT_AMBULATORY_CARE_PROVIDER_SITE_OTHER): Payer: Medicare Other | Admitting: Cardiology

## 2019-05-02 VITALS — BP 130/90 | HR 53 | Ht 64.0 in | Wt 159.8 lb

## 2019-05-02 DIAGNOSIS — R002 Palpitations: Secondary | ICD-10-CM

## 2019-05-02 DIAGNOSIS — I10 Essential (primary) hypertension: Secondary | ICD-10-CM | POA: Diagnosis not present

## 2019-05-02 NOTE — Progress Notes (Signed)
Cardiology Office Note:    Date:  05/02/2019   ID:  [name], DOB [birth year], MRN [id number]  PCP:  Shelby Crews, MD  Cardiologist:  Shelby Sable, MD  Electrophysiologist:  None   Referring MD: Shelby Crews, MD   Chief Complaint  Patient presents with  . New Patient (Initial Visit)    Papitations; Meds verbally reviewed with patient.    History of Present Illness:    [name] is a 59 y.o. female with a hx of hypertension, asthma, anxiety, presents due to palpitations.  Patient states having palpitations over the last 3 months which have worsened in the past 4 weeks.  Symptoms of palpitations are not related with exertion.  They typically last 5 to 10 minutes but 2 days ago, patient noticed strong heartbeats and fast heart rates lasting 30 minutes.  She denies any history of heart disease.  She thinks her symptoms are related to her reflux but is not sure.  Her Protonix was recently increased to twice daily.  Denies any history of smoking, presyncope or syncope.  She has midsternal heartburn for which she takes Protonix  Last echocardiogram in 2015 showed normal ejection fraction with EF 60 to 65%.  Past Medical History:  Diagnosis Date  . Abnormal EKG    HX OF INVERTED T WAVES ON EKG, PALPITATIONS, CHEST PAINS-CARDIAC WORK UP DID NOT SHOW ANY HEART DISEASE  . Acute postoperative pain 01/04/2017  . 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 Anemia    Iron Infusion-8 yrs ago  . Anxiety   . Asthma    INHALERS WITH FLARE -UPS  . Cephalalgia 08/18/2014  . Cervical disc disease 08/18/2014   Needs neck surgery.   . Chronic headaches   . DDD (degenerative disc disease)    CERVICAL AND LUMBAR-CHRONIC PAIN, RT HIP LABRAL TEAR  . Depression    PT STATES A LOT OF STRESS IN HER LIFE  . Dissociative disorder   . Dizziness 04/22/2013  . Duodenal ulcer with hemorrhage and perforation ([place]) 04/27/2003  . GERD (gastroesophageal reflux disease)   . H/O arthrodesis 08/18/2014    . Headache(784.0)    AND NECK PAIN--STATES RECENT TEST SHOW CERVICAL DEGENERATION  . History of blood transfusion    s/p back surgery  . History of cervical spinal surgery 01/04/2015  . History of kidney stones   . Hypertension   . Inverted T wave   . Narrowing of intervertebral disc space 08/18/2014   Currently on disability.   . Orthostatic hypotension 04/22/2013  . Pain    CHRONIC NECK AND BACK PAIN - LIMITED ROM NECK - S/P FUSIONS CERVICAL AND LUMBAR  . Pneumonia   . PONV (postoperative nausea and vomiting)    PT GIVES HX OF N&V AND FEVER WITH SURGERIES YEARS AGO--BUT NO PROBLEMS WITH MORE RECENT SURGERIES--STATES NOT MALIGNANT HYPERTHERMIA  . Postop Hyponatremia 05/14/2012  . Postoperative anemia due to acute blood loss 05/14/2012  . PTSD (post-traumatic stress disorder)   . Right foot drop   . 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     Past Surgical History:  Procedure Laterality Date  . ABDOMINAL HYSTERECTOMY    . ANTERIOR CERVICAL DECOMP/DISCECTOMY FUSION N/A 10/30/2012   Procedure: ACDF C5-6, EXPLORATION AND HARDWARE REMOVAL C6-7;  Surgeon: Melina Schools, MD;  Location: [place];  Service: Orthopedics;  Laterality: N/A;  . ANTERIOR FUSION CERVICAL SPINE  MAY 2012   AT [facility]  . BACK  SURGERY  2009   LUMBAR FUSION WITH RODS   . BREAST BIOPSY Right 2008   benign.- bx/clip  . BREAST EXCISIONAL BIOPSY Left 1998   benign  . BREAST REDUCTION SURGERY Bilateral 06/2016  . CARPAL TUNNEL RELEASE  05-06-12   Right  . CHOLECYSTECTOMY    . COLONOSCOPY WITH PROPOFOL N/A 01/26/2017   Procedure: COLONOSCOPY WITH PROPOFOL;  Surgeon: Shelby Lame, MD;  Location: [place];  Service: Endoscopy;  Laterality: N/A;  . DIAGNOSTIC LAPAROSCOPIES - MULTIPLE FOR ENDOMETRIOSIS    . ESOPHAGEAL DILATION N/A 01/26/2017   Procedure: ESOPHAGEAL DILATION;  Surgeon: Shelby Lame, MD;  Location: [place];  Service: Endoscopy;  Laterality: N/A;  .  ESOPHAGEAL MANOMETRY N/A 05/30/2017   Procedure: ESOPHAGEAL MANOMETRY (EM);  Surgeon: Shelby Lame, MD;  Location: [facility];  Service: Endoscopy;  Laterality: N/A;  . ESOPHAGOGASTRODUODENOSCOPY (EGD) WITH PROPOFOL N/A 01/26/2017   Procedure: ESOPHAGOGASTRODUODENOSCOPY (EGD) WITH PROPOFOL;  Surgeon: Shelby Lame, MD;  Location: [place];  Service: Endoscopy;  Laterality: N/A;  . HIP ARTHROSCOPY  09/20/2011   Procedure: ARTHROSCOPY HIP;  Surgeon: Shelby Alf, MD;  Location: [facility];  Service: Orthopedics;  Laterality: Right;  Right Hip Scope with Labral Debridement  . NASAL SEPTUM SURGERY  MARCH 2013   IN [place]  . [place] IMPEDANCE STUDY N/A 05/30/2017   Procedure: [place] IMPEDANCE STUDY;  Surgeon: Shelby Lame, MD;  Location: [facility];  Service: Endoscopy;  Laterality: N/A;  . POLYPECTOMY N/A 01/26/2017   Procedure: POLYPECTOMY;  Surgeon: Shelby Lame, MD;  Location: [place];  Service: Endoscopy;  Laterality: N/A;  . POSTERIOR CERVICAL FUSION/FORAMINOTOMY N/A 04/16/2013   Procedure: REMOVAL CERVICAL PLATES AND INTERBODY CAGE/POSTERIOR CERVICAL SPINAL FUSION C4 - C6/C5 CORPECTOMY/C4 - C6 FUSION WITH ILIAC CREST BONE GRAFT;  Surgeon: Melina Schools, MD;  Location: [place];  Service: Orthopedics;  Laterality: N/A;  . RADIOFREQUENCY ABLATION NERVES    . REDUCTION MAMMAPLASTY Bilateral 05/2016  . RIGHT HIP ARTHROSCOPY FOR LABRAL TEAR  ABOUT 2010   2012 also  . SHOULDER ARTHROSCOPY  05-06-12   bone spur  . TONSILLECTOMY     AS A CHILD  . TOTAL HIP ARTHROPLASTY Right 05/13/2012   Procedure: TOTAL HIP ARTHROPLASTY ANTERIOR APPROACH;  Surgeon: Shelby Alf, MD;  Location: [facility];  Service: Orthopedics;  Laterality: Right;    Current Medications: Current Meds  Medication Sig  . acetaminophen (TYLENOL) 500 MG tablet Take 500 mg by mouth every 6 (six) hours as needed.  Marland Kitchen albuterol (PROVENTIL HFA) 108 (90 Base) MCG/ACT inhaler Inhale 1-2 puffs into the lungs every 4 (four)  hours as needed.   Marland Kitchen buPROPion (WELLBUTRIN) 100 MG tablet Take 100 mg by mouth daily.   . calcium carbonate (TUMS - DOSED IN MG ELEMENTAL CALCIUM) 500 MG chewable tablet Chew 1 tablet by mouth 3 (three) times daily with meals. As needed  . Docusate Sodium (COLACE PO) Take by mouth daily.   . Fluticasone-Salmeterol (ADVAIR) 500-50 MCG/DOSE AEPB Inhale 1 puff into the lungs 2 (two) times daily. As needed  . metoprolol succinate (TOPROL-XL) 50 MG 24 hr tablet TAKE 1 TABLET BY MOUTH EVERY DAY  . montelukast (SINGULAIR) 10 MG tablet TAKE 1 TABLET BY MOUTH EVERY DAY  . ondansetron (ZOFRAN) 4 MG tablet TAKE 1 TABLET BY MOUTH EVERY 8 HOURS AS NEEDED FOR NAUSEA AND VOMITING  . oxyCODONE 10 MG TABS Take 1 tablet (10 mg total) by mouth every 6 (six) hours as needed for severe pain.  Must last 30 days  . oxyCODONE 10 MG TABS Take 1 tablet (10 mg total) by mouth every 6 (six) hours as needed for severe pain. Must last 30 days  . oxyCODONE 10 MG TABS Take 1 tablet (10 mg total) by mouth every 6 (six) hours as needed for severe pain. Must last 30 days  . pantoprazole (PROTONIX) 40 MG tablet Take 1 tablet (40 mg total) by mouth 2 (two) times daily.  Marland Kitchen Spacer/Aero-Holding Chambers (AEROCHAMBER MV) inhaler by Does not apply route.  . valACYclovir (VALTREX) 1000 MG tablet TAKE TWO TABLETS BY MOUTH TWICE DAILY FOR ONE DAY FEVER BLISTER     Allergies:   Amoxicillin, Chlorhexidine gluconate, Clindamycin, Codeine, Erythromycin, Penicillin g, Sulfa antibiotics, Levofloxacin, Shellfish allergy, Decadron [dexamethasone], Mangifera indica, Papaya derivatives, Betadine [povidone iodine], Chlorhexidine, Clarithromycin, Clindamycin/lincomycin, Other, Povidone-iodine, and Prednisone   Social History   Socioeconomic History  . Marital status: Married    Spouse name: bruce  . Number of children: 2  . Years of education: Not on file  . Highest education level: Associate degree: occupational, Hotel manager, or vocational program    Occupational History    Comment: disabled  Tobacco Use  . Smoking status: Never Smoker  . Smokeless tobacco: Never Used  Substance and Sexual Activity  . Alcohol use: No    Alcohol/week: 0.0 standard drinks  . Drug use: No  . Sexual activity: Yes  Other Topics Concern  . Not on file  Social History Narrative   Son and granddaughter   Social Determinants of Health   Financial Resource Strain:   . Difficulty of Paying Living Expenses: Not on file  Food Insecurity: Food Insecurity Present  . Worried About Charity fundraiser in the Last Year: Sometimes true  . Ran Out of Food in the Last Year: Not on file  Transportation Needs:   . Lack of Transportation (Medical): Not on file  . Lack of Transportation (Non-Medical): Not on file  Physical Activity: Insufficiently Active  . Days of Exercise per Week: 3 days  . Minutes of Exercise per Session: 30 min  Stress:   . Feeling of Stress : Not on file  Social Connections:   . Frequency of Communication with Friends and Family: Not on file  . Frequency of Social Gatherings with Friends and Family: Not on file  . Attends Religious Services: Not on file  . Active Member of Clubs or Organizations: Not on file  . Attends Archivist Meetings: Not on file  . Marital Status: Not on file     Family History: The patient's family history includes Aneurysm in her maternal grandmother and mother; Anxiety disorder in her grandchild; Bipolar disorder in her grandchild and sister; Breast cancer (age of onset: 47) in her paternal grandmother; Depression in her grandchild.  ROS:   Please see the history of present illness.     All other systems reviewed and are negative.  EKGs/Labs/Other Studies Reviewed:    The following studies were reviewed today:   EKG:  EKG is  ordered today.  The ekg ordered today demonstrates sinus bradycardia, heart rate 53.  Recent Labs: 11/11/2018: BUN 6; Creatinine, Ser 0.90; Magnesium 1.9; Potassium  4.0; Sodium 138  Recent Lipid Panel    Component Value Date/Time   CHOL 217 (H) 12/25/2017 1222   TRIG 131 12/25/2017 1222   HDL 68 12/25/2017 1222   CHOLHDL 3.2 12/25/2017 1222   LDLCALC 123 (H) 12/25/2017 1222    Physical Exam:  VS:  BP 130/90 (BP Location: Right Arm, Patient Position: Sitting, Cuff Size: Normal)   Pulse (!) 53   Ht 5\' 4"  (1.626 m)   Wt 159 lb 12 oz (72.5 kg)   SpO2 99%   BMI 27.42 kg/m     Wt Readings from Last 3 Encounters:  05/02/19 159 lb 12 oz (72.5 kg)  04/15/19 155 lb (70.3 kg)  03/25/19 158 lb 9.6 oz (71.9 kg)     GEN:  Well nourished, well developed in no acute distress HEENT: Normal NECK: No JVD; No carotid bruits LYMPHATICS: No lymphadenopathy CARDIAC: RRR, no murmurs, rubs, gallops RESPIRATORY:  Clear to auscultation without rales, wheezing or rhonchi  ABDOMEN: Soft, non-tender, non-distended MUSCULOSKELETAL:  No edema; No deformity  SKIN: Warm and dry NEUROLOGIC:  Alert and oriented x 3 PSYCHIATRIC:  Normal affect   ASSESSMENT:    1. Palpitations   2. Essential hypertension    PLAN:    In order of problems listed above:  1. Patient with worsening symptoms of palpitations.  Her symptoms could be secondary to atrial arrhythmias or anxiety.  We will evaluate with cardiac monitor x2 weeks. 2. History of hypertension, blood pressure appears controlled.  Continue Toprol-XL as prescribed.  This note was generated in part or whole with voice recognition software. Voice recognition is usually quite accurate but there are transcription errors that can and very often do occur. I apologize for any typographical errors that were not detected and corrected.  Medication Adjustments/Labs and Tests Ordered: Current medicines are reviewed at length with the patient today.  Concerns regarding medicines are outlined above.  Orders Placed This Encounter  Procedures  . LONG TERM MONITOR (3-14 DAYS)  . EKG 12-Lead   No orders of the defined types  were placed in this encounter.   Patient Instructions  Medication Instructions:  - Your physician recommends that you continue on your current medications as directed. Please refer to the Current Medication list given to you today.  *If you need a refill on your cardiac medications before your next appointment, please call your pharmacy*  Lab Work: - none ordered  If you have labs (blood work) drawn today and your tests are completely normal, you will receive your results only by: Marland Kitchen MyChart Message (if you have MyChart) OR . A paper copy in the mail If you have any lab test that is abnormal or we need to change your treatment, we will call you to review the results.  Testing/Procedures: - Your physician has recommended that you wear a 14 day Zio monitor (placed in office today). This monitor is a medical device that records the heart's electrical activity. Doctors most often use these monitors to diagnose arrhythmias. Arrhythmias are problems with the speed or rhythm of the heartbeat. The monitor is a small device applied to your chest. You can wear one while you do your normal daily activities. While wearing this monitor if you have any symptoms to push the button and record what you felt. Once you have worn this monitor for the period of time provider prescribed (Usually 14 days), you will return the monitor device in the postage paid box. Once it is returned they will download the data collected and provide [place] with a report which the provider will then review and we will call you with those results. Important tips:  1. Avoid showering during the first 24 hours of wearing the monitor. 2. Avoid excessive sweating to help maximize wear time. 3. Do not  submerge the device, no hot tubs, and no swimming pools. 4. Keep any lotions or oils away from the patch. 5. After 24 hours you may shower with the patch on. Take brief showers with your back facing the shower head.  6. Do not remove patch once  it has been placed because that will interrupt data and decrease adhesive wear time. 7. Push the button when you have any symptoms and write down what you were feeling. 8. Once you have completed wearing your monitor, remove and place into box which has postage paid and place in your outgoing mailbox.  9. If for some reason you have misplaced your box then call our office and we can provide another box and/or mail it off for you.        Follow-Up: At [facility], you and your health needs are our priority.  As part of our continuing mission to provide you with exceptional heart care, we have created designated Provider Care Teams.  These Care Teams include your primary Cardiologist (physician) and Advanced Practice Providers (APPs -  Physician Assistants and Nurse Practitioners) who all work together to provide you with the care you need, when you need it.  Your next appointment:   4 week(s)  The format for your next appointment:   In Person  Provider:   Kate Sable, MD  Other Instructions n/a     Signed, Shelby Sable, MD  05/02/2019 4:37 PM    [place]

## 2019-05-02 NOTE — Patient Instructions (Signed)
Medication Instructions:  - Your physician recommends that you continue on your current medications as directed. Please refer to the Current Medication list given to you today.  *If you need a refill on your cardiac medications before your next appointment, please call your pharmacy*  Lab Work: - none ordered  If you have labs (blood work) drawn today and your tests are completely normal, you will receive your results only by: Marland Kitchen MyChart Message (if you have MyChart) OR . A paper copy in the mail If you have any lab test that is abnormal or we need to change your treatment, we will call you to review the results.  Testing/Procedures: - Your physician has recommended that you wear a 14 day Zio monitor (placed in office today). This monitor is a medical device that records the heart's electrical activity. Doctors most often use these monitors to diagnose arrhythmias. Arrhythmias are problems with the speed or rhythm of the heartbeat. The monitor is a small device applied to your chest. You can wear one while you do your normal daily activities. While wearing this monitor if you have any symptoms to push the button and record what you felt. Once you have worn this monitor for the period of time provider prescribed (Usually 14 days), you will return the monitor device in the postage paid box. Once it is returned they will download the data collected and provide [place] with a report which the provider will then review and we will call you with those results. Important tips:  1. Avoid showering during the first 24 hours of wearing the monitor. 2. Avoid excessive sweating to help maximize wear time. 3. Do not submerge the device, no hot tubs, and no swimming pools. 4. Keep any lotions or oils away from the patch. 5. After 24 hours you may shower with the patch on. Take brief showers with your back facing the shower head.  6. Do not remove patch once it has been placed because that will interrupt data and  decrease adhesive wear time. 7. Push the button when you have any symptoms and write down what you were feeling. 8. Once you have completed wearing your monitor, remove and place into box which has postage paid and place in your outgoing mailbox.  9. If for some reason you have misplaced your box then call our office and we can provide another box and/or mail it off for you.        Follow-Up: At [facility], you and your health needs are our priority.  As part of our continuing mission to provide you with exceptional heart care, we have created designated Provider Care Teams.  These Care Teams include your primary Cardiologist (physician) and Advanced Practice Providers (APPs -  Physician Assistants and Nurse Practitioners) who all work together to provide you with the care you need, when you need it.  Your next appointment:   4 week(s)  The format for your next appointment:   In Person  Provider:   Kate Sable, MD  Other Instructions n/a

## 2019-05-12 HISTORY — PX: OTHER SURGICAL HISTORY: SHX169

## 2019-05-15 ENCOUNTER — Encounter: Payer: Self-pay | Admitting: Pain Medicine

## 2019-05-15 NOTE — Progress Notes (Signed)
Patient: [name]  Service Category: E/M  Provider: Gaspar Cola, MD  DOB: 02-03-1961  DOS: 05/19/2019  Location: Office  MRN: [id number]  Setting: Ambulatory outpatient  Referring Provider: Virginia Crews, MD  Type: Established Patient  Specialty: Interventional Pain Management  PCP: Virginia Crews, MD  Location: Remote location  Delivery: TeleHealth     Virtual Encounter - Pain Management PROVIDER NOTE: Information contained herein reflects review and annotations entered in association with encounter. Interpretation of such information and data should be left to medically-trained personnel. Information provided to patient can be located elsewhere in the medical record under "Patient Instructions". Document created using STT-dictation technology, any transcriptional errors that may result from process are unintentional.    Contact & Pharmacy Preferred: [phone] Home: [phone] (home) Mobile: There is no such number on file (mobile). E-mail: [email or web address]  [id number] [place] [address]Phone: [phone]: [phone]  Pre-screening  Ms. HStickleyoffered "in-person" vs "virtual" encounter. She indicated preferring virtual for this encounter.   Reason COVID-19*  Social distancing based on CDC and AMA recommendations.   I contacted RJones Skeneon 05/19/2019 via telephone.      I clearly identified myself as FGaspar Cola MD. I verified that I was speaking with the correct person using two identifiers (Name: RNISHITA ISAACKS and date of birth: 3Nov 25, 1962.  Consent I sought verbal advanced consent from RJones Skenefor virtual visit interactions. I informed Ms. HCenciof possible security and privacy concerns, risks, and limitations associated with providing "not-in-person" medical evaluation and management services. I also informed Ms. HRoundsof the availability of  "in-person" appointments. Finally, I informed her that there would be a charge for the virtual visit and that she could be  personally, fully or partially, financially responsible for it. Ms. HKonickiexpressed understanding and agreed to proceed.   Historic Elements   Ms. RMARVETTA VOHSis a 59y.o. year old, female patient evaluated today after her last contact with our practice on 01/15/2019. Ms. 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), Anemia, Anxiety, Asthma, Cephalalgia (08/18/2014), Cervical disc disease (08/18/2014), Chronic headaches, DDD (degenerative disc disease), Depression, Dissociative disorder, Dizziness (04/22/2013), Duodenal ulcer with hemorrhage and perforation ([place] (04/27/2003), GERD (gastroesophageal reflux disease), H/O arthrodesis (08/18/2014), Headache(784.0), History of blood transfusion, History of cervical spinal surgery (01/04/2015), History of kidney stones, Hypertension, Inverted T wave, Narrowing of intervertebral disc space (08/18/2014), Orthostatic hypotension (04/22/2013), Pain, Pneumonia, PONV (postoperative nausea and vomiting), Postop Hyponatremia (05/14/2012), Postoperative anemia due to acute blood loss (05/14/2012), PTSD (post-traumatic stress disorder), Right foot drop, and Right hip arthralgia (08/18/2014). She also  has a past surgical history that includes RIGHT HIP ARTHROSCOPY FOR LABRAL TEAR (ABOUT 2010); Anterior fusion cervical spine (MAY 2012); Nasal septum surgery (MARCH 2013); Back surgery (2009); Tonsillectomy; Abdominal hysterectomy; DIAGNOSTIC LAPAROSCOPIES - MULTIPLE FOR ENDOMETRIOSIS; Cholecystectomy; Hip arthroscopy (09/20/2011); Shoulder arthroscopy (05-06-12); Carpal tunnel release (05-06-12); Total hip arthroplasty (Right, 05/13/2012); Anterior cervical decomp/discectomy fusion (N/A, 10/30/2012); Posterior cervical fusion/foraminotomy (N/A, 04/16/2013); Breast reduction surgery (Bilateral, 06/2016);  Colonoscopy with propofol (N/A, 01/26/2017); Esophagogastroduodenoscopy (egd) with propofol (N/A, 01/26/2017); Esophageal dilation (N/A, 01/26/2017); polypectomy (N/A, 01/26/2017); PH impedance study (N/A, 05/30/2017); Esophageal manometry (N/A, 05/30/2017); Radiofrequency ablation nerves; Reduction mammaplasty (Bilateral, 05/2016); Breast biopsy (Right, 2008); and Breast excisional biopsy (Left, 1998). Ms. HRosamondhas a current medication list which includes the following  prescription(s): acetaminophen, albuterol, calcium carbonate, docusate sodium, fluticasone-salmeterol, ibuprofen, metoprolol succinate, montelukast, omeprazole, ondansetron, [START ON 05/26/2019] oxycodone hcl, [START ON 06/25/2019] oxycodone hcl, [START ON 07/25/2019] oxycodone hcl, pantoprazole, aerochamber mv, and valacyclovir. She  reports that she has never smoked. She has never used smokeless tobacco. She reports that she does not drink alcohol or use drugs. Ms. Cliburn is allergic to amoxicillin; chlorhexidine gluconate; clindamycin; codeine; erythromycin; penicillin g; sulfa antibiotics; levofloxacin; shellfish allergy; decadron [dexamethasone]; mangifera indica; papaya derivatives; betadine [povidone iodine]; chlorhexidine; clarithromycin; clindamycin/lincomycin; other; povidone-iodine; and prednisone.   HPI  Today, she is being contacted for medication management.  The patient indicates doing well with the current medication regimen. No adverse reactions or side effects reported to the medications.   Pharmacotherapy Assessment  Analgesic: Oxycodone IR 10 mg, 1 tab PO q 6 hrs (40 mg/day of oxycodone) MME/day: 60 mg/day.   Monitoring: [place] PMP: PDMP reviewed during this encounter.       Pharmacotherapy: No side-effects or adverse reactions reported. Compliance: No problems identified. Effectiveness: Clinically acceptable. Plan: Refer to "POC".  UDS:  Summary  Date Value Ref Range Status  11/11/2018 Note  Final    Comment:     ==================================================================== [employer] Select 13 (MW) ==================================================================== Test                             Result       Flag       Units Drug Present and Declared for Prescription Verification   Alprazolam                     167          EXPECTED   ng/mg creat   Alpha-hydroxyalprazolam        564          EXPECTED   ng/mg creat    Source of alprazolam is a scheduled prescription medication. Alpha-    hydroxyalprazolam is an expected metabolite of alprazolam.   Oxycodone                      2512         EXPECTED   ng/mg creat   Oxymorphone                    1271         EXPECTED   ng/mg creat   Noroxycodone                   6433         EXPECTED   ng/mg creat   Noroxymorphone                 145          EXPECTED   ng/mg creat    Sources of oxycodone are scheduled prescription medications.    Oxymorphone, noroxycodone, and noroxymorphone are expected    metabolites of oxycodone. Oxymorphone is also available as a    scheduled prescription medication. ==================================================================== Test                      Result    Flag   Units      Ref Range   Creatinine              126              mg/dL      >=  20 ==================================================================== Declared Medications:  The flagging and interpretation on this report are based on the  following declared medications.  Unexpected results may arise from  inaccuracies in the declared medications.  **Note: The testing scope of this panel includes these medications:  Alprazolam  Oxycodone  **Note: The testing scope of this panel does not include the  following reported medications:  Albuterol  Bupropion (Wellbutrin)  Calcium  Dexlansoprazole (Dexilant)  Docusate  Duloxetine  Fluticasone  Magnesium (Mag-Ox)  Metoprolol  Montelukast  Ondansetron  Pregabalin  Salmeterol   Valacyclovir ==================================================================== For clinical consultation, please call [phone]. ====================================================================    Laboratory Chemistry Profile   Renal Lab Results  Component Value Date   BUN 6 11/11/2018   CREATININE 0.90 11/11/2018   BCR 7 (L) 11/11/2018   GFRAA 81 11/11/2018   GFRNONAA 70 11/11/2018    Hepatic Lab Results  Component Value Date   AST 28 11/11/2018   ALT 9 12/25/2017   ALBUMIN 4.0 11/11/2018   ALKPHOS 134 (H) 11/11/2018   LIPASE 23 04/06/2015    Electrolytes Lab Results  Component Value Date   NA 138 11/11/2018   K 4.0 11/11/2018   CL 103 11/11/2018   CALCIUM 9.2 11/11/2018   MG 1.9 11/11/2018    Bone Lab Results  Component Value Date   25OHVITD1 23 (L) 11/11/2018   25OHVITD2 9.8 11/11/2018   25OHVITD3 13 11/11/2018    Inflammation (CRP: Acute Phase) (ESR: Chronic Phase) Lab Results  Component Value Date   CRP 12 (H) 11/11/2018   ESRSEDRATE 42 (H) 11/11/2018      Note: Above Lab results reviewed.  Imaging  MM 3D SCREEN BREAST BILATERAL CLINICAL DATA:  Screening.  EXAM: DIGITAL SCREENING BILATERAL MAMMOGRAM WITH TOMO AND CAD  COMPARISON:  Previous exam(s).  ACR Breast Density Category b: There are scattered areas of fibroglandular density.  FINDINGS: There are no findings suspicious for malignancy. Images were processed with CAD.  IMPRESSION: No mammographic evidence of malignancy. A result letter of this screening mammogram will be mailed directly to the patient.  RECOMMENDATION: Screening mammogram in one year. (Code:[id number]-B-01Y)  BI-RADS CATEGORY  1: Negative.  Electronically Signed   By: Margarette Canada M.D.   On: 02/11/2019 15:19  Assessment  The primary encounter diagnosis was Chronic low back pain (Primary area of Pain) (Bilateral) (L>R) w/o sciatica. 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  Plan of Care  Problem-specific:  No problem-specific Assessment & Plan notes found for this encounter.  Ms. CAMREE WIGINGTON has a current medication list which includes the following long-term medication(s): albuterol, metoprolol succinate, montelukast, [START ON 05/26/2019] oxycodone hcl, [START ON 06/25/2019] oxycodone hcl, [START ON 07/25/2019] oxycodone hcl, and pantoprazole.  Pharmacotherapy (Medications Ordered): Meds ordered this encounter  Medications  . Oxycodone HCl 10 MG TABS    Sig: Take 1 tablet (10 mg total) by mouth every 6 (six) hours as needed. Must last 30 days    Dispense:  120 tablet    Refill:  0    Chronic Pain: STOP Act (Not applicable) Fill 1 day early if closed on refill date. Do not fill until: 05/26/2019. To last until: 06/25/2019. Avoid benzodiazepines within 8 hours of opioids  . Oxycodone HCl 10 MG TABS    Sig: Take 1 tablet (10 mg total) by mouth every 6 (six) hours as  needed. Must last 30 days    Dispense:  120 tablet    Refill:  0    Chronic Pain: STOP Act (Not applicable) Fill 1 day early if closed on refill date. Do not fill until: 06/25/2019. To last until: 07/25/2019. Avoid benzodiazepines within 8 hours of opioids  . Oxycodone HCl 10 MG TABS    Sig: Take 1 tablet (10 mg total) by mouth every 6 (six) hours as needed. Must last 30 days    Dispense:  120 tablet    Refill:  0    Chronic Pain: STOP Act (Not applicable) Fill 1 day early if closed on refill date. Do not fill until: 07/25/2019. To last until: 08/24/2019. Avoid benzodiazepines within 8 hours of opioids   Orders:  No orders of the defined types were placed in this encounter.  Follow-up plan:   Return in about 3 months (around 08/20/2019) for (VV), (MM).      Interventional management options: Considering:   NOTE:  1. SHELLFISH ALLERGY; Dexamethasone & Prednisone  allergy. 2. NOT a candidate for lumbar facet RFA (due to hardware). 3. NOT a candidate for membrane stabilizers (Neurontin/Lyrica) due to prior failed trials. 4. NOT a candidate for SCS (due to hardware). 5. NOT a candidate for IT Pump (patient's choice due to surgical risks).  Possiblerightsuprascapular nerve RFA #1   Palliative PRN treatment(s):   Diagnostic bilateral lumbar facet block #2 (No benefit in doing injection w/o steroids.) Palliativebilateral suprascapular NB #3(No Steroids) Palliative leftsuprascapular nerve RFA #2(last done on 01/04/2017)     Recent Visits No visits were found meeting these conditions.  Showing recent visits within past 90 days and meeting all other requirements   Today's Visits Date Type Provider Dept  05/19/19 Telemedicine Milinda Pointer, MD [facility]  Showing today's visits and meeting all other requirements   Future Appointments No visits were found meeting these conditions.  Showing future appointments within next 90 days and meeting all other requirements   I discussed the assessment and treatment plan with the patient. The patient was provided an opportunity to ask questions and all were answered. The patient agreed with the plan and demonstrated an understanding of the instructions.  Patient advised to call back or seek an in-person evaluation if the symptoms or condition worsens.  Duration of encounter: 12 minutes.  Note by: Gaspar Cola, MD Date: 05/19/2019; Time: 11:52 AM

## 2019-05-19 ENCOUNTER — Other Ambulatory Visit: Payer: Self-pay

## 2019-05-19 ENCOUNTER — Ambulatory Visit: Payer: Medicare Other | Attending: Pain Medicine | Admitting: Pain Medicine

## 2019-05-19 DIAGNOSIS — M25552 Pain in left hip: Secondary | ICD-10-CM

## 2019-05-19 DIAGNOSIS — G96198 Other disorders of meninges, not elsewhere classified: Secondary | ICD-10-CM | POA: Diagnosis not present

## 2019-05-19 DIAGNOSIS — G894 Chronic pain syndrome: Secondary | ICD-10-CM | POA: Diagnosis not present

## 2019-05-19 DIAGNOSIS — M545 Low back pain: Secondary | ICD-10-CM | POA: Diagnosis not present

## 2019-05-19 DIAGNOSIS — G8929 Other chronic pain: Secondary | ICD-10-CM

## 2019-05-19 DIAGNOSIS — M961 Postlaminectomy syndrome, not elsewhere classified: Secondary | ICD-10-CM | POA: Diagnosis not present

## 2019-05-19 DIAGNOSIS — M25551 Pain in right hip: Secondary | ICD-10-CM | POA: Diagnosis not present

## 2019-05-19 MED ORDER — OXYCODONE HCL 10 MG PO TABS: 10.0000 mg | ORAL_TABLET | Freq: Four times a day (QID) | ORAL | 0 refills | Status: DC | PRN

## 2019-05-24 DIAGNOSIS — R002 Palpitations: Secondary | ICD-10-CM | POA: Diagnosis not present

## 2019-05-26 ENCOUNTER — Telehealth: Payer: Medicare Other | Admitting: Pain Medicine

## 2019-05-27 ENCOUNTER — Telehealth: Payer: Self-pay | Admitting: *Deleted

## 2019-05-27 MED ORDER — APIXABAN 5 MG PO TABS: 5.0000 mg | ORAL_TABLET | Freq: Two times a day (BID) | ORAL | 4 refills | Status: DC

## 2019-05-27 NOTE — Telephone Encounter (Signed)
-----   Message from Kate Sable, MD sent at 05/27/2019 10:13 AM EDT ----- Paroxysmal atrial fibrillation noted on cardiac monitor.  Please start Eliquis 5 mg twice daily.  Keep follow-up appointment with me.

## 2019-05-27 NOTE — Telephone Encounter (Signed)
Results called to pt. Pt verbalized understanding. She is aware to to keep f/u appointment as scheduled. She is agreeable to start Eliquis 5 mg BID. 30-day free card info included on e-script.

## 2019-05-28 MED ORDER — APIXABAN 5 MG PO TABS: 5.0000 mg | ORAL_TABLET | Freq: Two times a day (BID) | ORAL | 4 refills | Status: DC

## 2019-05-28 NOTE — Telephone Encounter (Signed)
Patient calling in stating pharmacy has not received Eliquis prescription yet

## 2019-05-28 NOTE — Telephone Encounter (Signed)
Called pharmacy and they have not received the Eliquis prescription yet. Resent and they did receive it. They cannot run the 30day free card info because it needs to be activated.  Called patient and she is aware it will cost her $9 without the 30 day free card and that once she gets the first fill she may not be able to use after that. She is fine with the cost. She was appreciative.

## 2019-05-28 NOTE — Addendum Note (Signed)
Addended by: Vanessa Ralphs on: 05/28/2019 10:46 AM   Modules accepted: Orders

## 2019-05-29 ENCOUNTER — Other Ambulatory Visit: Payer: Self-pay

## 2019-05-29 ENCOUNTER — Encounter: Payer: Self-pay | Admitting: Cardiology

## 2019-05-29 ENCOUNTER — Ambulatory Visit (INDEPENDENT_AMBULATORY_CARE_PROVIDER_SITE_OTHER): Payer: Medicare Other | Admitting: Cardiology

## 2019-05-29 VITALS — BP 122/80 | HR 55 | Ht 63.0 in | Wt 156.1 lb

## 2019-05-29 DIAGNOSIS — I10 Essential (primary) hypertension: Secondary | ICD-10-CM | POA: Diagnosis not present

## 2019-05-29 DIAGNOSIS — Z8659 Personal history of other mental and behavioral disorders: Secondary | ICD-10-CM

## 2019-05-29 DIAGNOSIS — I480 Paroxysmal atrial fibrillation: Secondary | ICD-10-CM

## 2019-05-29 NOTE — Patient Instructions (Signed)
Medication Instructions:  Your physician recommends that you continue on your current medications as directed. Please refer to the Current Medication list given to you today.  *If you need a refill on your cardiac medications before your next appointment, please call your pharmacy*   Lab Work: none If you have labs (blood work) drawn today and your tests are completely normal, you will receive your results only by: Marland Kitchen MyChart Message (if you have MyChart) OR . A paper copy in the mail If you have any lab test that is abnormal or we need to change your treatment, we will call you to review the results.   Testing/Procedures: Your physician has requested that you have an echocardiogram. Echocardiography is a painless test that uses sound waves to create images of your heart. It provides your doctor with information about the size and shape of your heart and how well your heart's chambers and valves are working. This procedure takes approximately one hour. There are no restrictions for this procedure. You may get an IV, if needed, to receive an ultrasound enhancing agent through to better visualize your heart.   Follow-Up: At [facility], you and your health needs are our priority.  As part of our continuing mission to provide you with exceptional heart care, we have created designated Provider Care Teams.  These Care Teams include your primary Cardiologist (physician) and Advanced Practice Providers (APPs -  Physician Assistants and Nurse Practitioners) who all work together to provide you with the care you need, when you need it.  We recommend signing up for the patient portal called "MyChart".  Sign up information is provided on this After Visit Summary.  MyChart is used to connect with patients for Virtual Visits (Telemedicine).  Patients are able to view lab/test results, encounter notes, upcoming appointments, etc.  Non-urgent messages can be sent to your provider as well.   To learn more  about what you can do with MyChart, go to [email or web address].    Your next appointment:   After echo.  The format for your next appointment:   In Person  Provider:   Kate Sable, MD    Echocardiogram An echocardiogram is a procedure that uses painless sound waves (ultrasound) to produce an image of the heart. Images from an echocardiogram can provide important information about:  Signs of coronary artery disease (CAD).  Aneurysm detection. An aneurysm is a weak or damaged part of an artery wall that bulges out from the normal force of blood pumping through the body.  Heart size and shape. Changes in the size or shape of the heart can be associated with certain conditions, including heart failure, aneurysm, and CAD.  Heart muscle function.  Heart valve function.  Signs of a past heart attack.  Fluid buildup around the heart.  Thickening of the heart muscle.  A tumor or infectious growth around the heart valves. Tell a health care provider about:  Any allergies you have.  All medicines you are taking, including vitamins, herbs, eye drops, creams, and over-the-counter medicines.  Any blood disorders you have.  Any surgeries you have had.  Any medical conditions you have.  Whether you are pregnant or may be pregnant. What are the risks? Generally, this is a safe procedure. However, problems may occur, including:  Allergic reaction to dye (contrast) that may be used during the procedure. What happens before the procedure? No specific preparation is needed. You may eat and drink normally. What happens during the procedure?   An  IV tube may be inserted into one of your veins.  You may receive contrast through this tube. A contrast is an injection that improves the quality of the pictures from your heart.  A gel will be applied to your chest.  A wand-like tool (transducer) will be moved over your chest. The gel will help to transmit the sound waves from  the transducer.  The sound waves will harmlessly bounce off of your heart to allow the heart images to be captured in real-time motion. The images will be recorded on a computer. The procedure may vary among health care providers and hospitals. What happens after the procedure?  You may return to your normal, everyday life, including diet, activities, and medicines, unless your health care provider tells you not to do that. Summary  An echocardiogram is a procedure that uses painless sound waves (ultrasound) to produce an image of the heart.  Images from an echocardiogram can provide important information about the size and shape of your heart, heart muscle function, heart valve function, and fluid buildup around your heart.  You do not need to do anything to prepare before this procedure. You may eat and drink normally.  After the echocardiogram is completed, you may return to your normal, everyday life, unless your health care provider tells you not to do that. This information is not intended to replace advice given to you by your health care provider. Make sure you discuss any questions you have with your health care provider. Document Revised: 06/20/2018 Document Reviewed: 04/01/2016 Elsevier Patient Education  [place].

## 2019-05-29 NOTE — Progress Notes (Signed)
Cardiology Office Note:    Date:  05/29/2019   ID:  [name], DOB Aug 10, 1960, MRN [id number]  PCP:  Virginia Crews, MD  Cardiologist:  Kate Sable, MD  Electrophysiologist:  None   Referring MD: Virginia Crews, MD   Chief Complaint  Patient presents with  . other    4 week follow up and discuss Zio monitor results. Meds reviewed by the pt. verbally. Pt. c/o difficulty getting a good breath, fatigue, chest pressure and palpitations.     History of Present Illness:    [name] is a 59 y.o. female with a hx of hypertension, asthma, anxiety, presents for follow-up.  She was last seen due to palpitations.  Patient states having worsening palpitations over 3 months.  Symptoms of palpitations are not related with exertion.  They typically last 5 to 10 minutes but occasionally patient noticed strong heartbeats and fast heart rates lasting 30 minutes.  She denies any history of heart disease.  She thinks her symptoms are related to her reflux but is not sure.  Her Protonix was increased to twice daily.  Denies any history of smoking, presyncope or syncope.  Cardiac monitor was ordered.  Patient now presents for results.  Patient states having 3 grandchildren at home who causes her to have stress and anxiety.  Whenever she is anxious, she feels like she cannot breathe and her heart rates go up.  Last echocardiogram in 2015 showed normal ejection fraction with EF 60 to 65%.  Past Medical History:  Diagnosis Date  . Abnormal EKG    HX OF INVERTED T WAVES ON EKG, PALPITATIONS, CHEST PAINS-CARDIAC WORK UP DID NOT SHOW ANY HEART DISEASE  . Acute postoperative pain 01/04/2017  . 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 Anemia    Iron Infusion-8 yrs ago  . Anxiety   . Asthma    INHALERS WITH FLARE -UPS  . Cephalalgia 08/18/2014  . Cervical disc disease 08/18/2014   Needs neck surgery.   . Chronic headaches   . DDD (degenerative disc disease)    CERVICAL AND LUMBAR-CHRONIC PAIN,  RT HIP LABRAL TEAR  . Depression    PT STATES A LOT OF STRESS IN HER LIFE  . Dissociative disorder   . Dizziness 04/22/2013  . Duodenal ulcer with hemorrhage and perforation ([place]) 04/27/2003  . GERD (gastroesophageal reflux disease)   . H/O arthrodesis 08/18/2014  . Headache(784.0)    AND NECK PAIN--STATES RECENT TEST SHOW CERVICAL DEGENERATION  . History of blood transfusion    s/p back surgery  . History of cervical spinal surgery 01/04/2015  . History of kidney stones   . Hypertension   . Inverted T wave   . Narrowing of intervertebral disc space 08/18/2014   Currently on disability.   . Orthostatic hypotension 04/22/2013  . Pain    CHRONIC NECK AND BACK PAIN - LIMITED ROM NECK - S/P FUSIONS CERVICAL AND LUMBAR  . Pneumonia   . PONV (postoperative nausea and vomiting)    PT GIVES HX OF N&V AND FEVER WITH SURGERIES YEARS AGO--BUT NO PROBLEMS WITH MORE RECENT SURGERIES--STATES NOT MALIGNANT HYPERTHERMIA  . Postop Hyponatremia 05/14/2012  . Postoperative anemia due to acute blood loss 05/14/2012  . PTSD (post-traumatic stress disorder)   . Right foot drop   . 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     Past Surgical History:  Procedure Laterality Date  . ABDOMINAL HYSTERECTOMY    .  ANTERIOR CERVICAL DECOMP/DISCECTOMY FUSION N/A 10/30/2012   Procedure: ACDF C5-6, EXPLORATION AND HARDWARE REMOVAL C6-7;  Surgeon: Melina Schools, MD;  Location: [place];  Service: Orthopedics;  Laterality: N/A;  . ANTERIOR FUSION CERVICAL SPINE  MAY 2012   AT [facility]  . BACK SURGERY  2009   LUMBAR FUSION WITH RODS   . BREAST BIOPSY Right 2008   benign.- bx/clip  . BREAST EXCISIONAL BIOPSY Left 1998   benign  . BREAST REDUCTION SURGERY Bilateral 06/2016  . CARPAL TUNNEL RELEASE  05-06-12   Right  . CHOLECYSTECTOMY    . COLONOSCOPY WITH PROPOFOL N/A 01/26/2017   Procedure: COLONOSCOPY WITH PROPOFOL;  Surgeon: Lucilla Lame, MD;  Location: [place];  Service:  Endoscopy;  Laterality: N/A;  . DIAGNOSTIC LAPAROSCOPIES - MULTIPLE FOR ENDOMETRIOSIS    . ESOPHAGEAL DILATION N/A 01/26/2017   Procedure: ESOPHAGEAL DILATION;  Surgeon: Lucilla Lame, MD;  Location: [place];  Service: Endoscopy;  Laterality: N/A;  . ESOPHAGEAL MANOMETRY N/A 05/30/2017   Procedure: ESOPHAGEAL MANOMETRY (EM);  Surgeon: Lucilla Lame, MD;  Location: [facility];  Service: Endoscopy;  Laterality: N/A;  . ESOPHAGOGASTRODUODENOSCOPY (EGD) WITH PROPOFOL N/A 01/26/2017   Procedure: ESOPHAGOGASTRODUODENOSCOPY (EGD) WITH PROPOFOL;  Surgeon: Lucilla Lame, MD;  Location: [place];  Service: Endoscopy;  Laterality: N/A;  . HIP ARTHROSCOPY  09/20/2011   Procedure: ARTHROSCOPY HIP;  Surgeon: Gearlean Alf, MD;  Location: [facility];  Service: Orthopedics;  Laterality: Right;  Right Hip Scope with Labral Debridement  . NASAL SEPTUM SURGERY  MARCH 2013   IN [place]  . [place] IMPEDANCE STUDY N/A 05/30/2017   Procedure: [place] IMPEDANCE STUDY;  Surgeon: Lucilla Lame, MD;  Location: [facility];  Service: Endoscopy;  Laterality: N/A;  . POLYPECTOMY N/A 01/26/2017   Procedure: POLYPECTOMY;  Surgeon: Lucilla Lame, MD;  Location: [place];  Service: Endoscopy;  Laterality: N/A;  . POSTERIOR CERVICAL FUSION/FORAMINOTOMY N/A 04/16/2013   Procedure: REMOVAL CERVICAL PLATES AND INTERBODY CAGE/POSTERIOR CERVICAL SPINAL FUSION C4 - C6/C5 CORPECTOMY/C4 - C6 FUSION WITH ILIAC CREST BONE GRAFT;  Surgeon: Melina Schools, MD;  Location: [place];  Service: Orthopedics;  Laterality: N/A;  . RADIOFREQUENCY ABLATION NERVES    . REDUCTION MAMMAPLASTY Bilateral 05/2016  . RIGHT HIP ARTHROSCOPY FOR LABRAL TEAR  ABOUT 2010   2012 also  . SHOULDER ARTHROSCOPY  05-06-12   bone spur  . TONSILLECTOMY     AS A CHILD  . TOTAL HIP ARTHROPLASTY Right 05/13/2012   Procedure: TOTAL HIP ARTHROPLASTY ANTERIOR APPROACH;  Surgeon: Gearlean Alf, MD;  Location: [facility];  Service: Orthopedics;  Laterality:  Right;    Current Medications: Current Meds  Medication Sig  . acetaminophen (TYLENOL) 500 MG tablet Take 500 mg by mouth every 6 (six) hours as needed.  Marland Kitchen albuterol (PROVENTIL HFA) 108 (90 Base) MCG/ACT inhaler Inhale 1-2 puffs into the lungs every 4 (four) hours as needed.   Marland Kitchen apixaban (ELIQUIS) 5 MG TABS tablet Take 1 tablet (5 mg total) by mouth 2 (two) times daily.  . calcium carbonate (TUMS - DOSED IN MG ELEMENTAL CALCIUM) 500 MG chewable tablet Chew 1 tablet by mouth 3 (three) times daily with meals. As needed  . Docusate Sodium (COLACE PO) Take by mouth daily.   . Fluticasone-Salmeterol (ADVAIR) 500-50 MCG/DOSE AEPB Inhale 1 puff into the lungs 2 (two) times daily. As needed  . ibuprofen (ADVIL) 200 MG tablet Take 200 mg by mouth every 6 (six) hours as needed.  . metoprolol succinate (  TOPROL-XL) 50 MG 24 hr tablet TAKE 1 TABLET BY MOUTH EVERY DAY  . montelukast (SINGULAIR) 10 MG tablet TAKE 1 TABLET BY MOUTH EVERY DAY  . omeprazole (PRILOSEC) 40 MG capsule Take 40 mg by mouth in the morning and at bedtime.  . ondansetron (ZOFRAN) 4 MG tablet TAKE 1 TABLET BY MOUTH EVERY 8 HOURS AS NEEDED FOR NAUSEA AND VOMITING  . Oxycodone HCl 10 MG TABS Take 1 tablet (10 mg total) by mouth every 6 (six) hours as needed. Must last 30 days  . [START ON 07/25/2019] Oxycodone HCl 10 MG TABS Take 1 tablet (10 mg total) by mouth every 6 (six) hours as needed. Must last 30 days  . pantoprazole (PROTONIX) 40 MG tablet Take 1 tablet (40 mg total) by mouth 2 (two) times daily.  Marland Kitchen Spacer/Aero-Holding Chambers (AEROCHAMBER MV) inhaler by Does not apply route.  . valACYclovir (VALTREX) 1000 MG tablet TAKE TWO TABLETS BY MOUTH TWICE DAILY FOR ONE DAY FEVER BLISTER     Allergies:   Amoxicillin, Chlorhexidine gluconate, Clindamycin, Codeine, Erythromycin, Penicillin g, Sulfa antibiotics, Levofloxacin, Shellfish allergy, Decadron [dexamethasone], Mangifera indica, Papaya derivatives, Betadine [povidone iodine],  Chlorhexidine, Clarithromycin, Clindamycin/lincomycin, Other, Povidone-iodine, and Prednisone   Social History   Socioeconomic History  . Marital status: Married    Spouse name: bruce  . Number of children: 2  . Years of education: Not on file  . Highest education level: Associate degree: occupational, Hotel manager, or vocational program  Occupational History    Comment: disabled  Tobacco Use  . Smoking status: Never Smoker  . Smokeless tobacco: Never Used  Substance and Sexual Activity  . Alcohol use: No    Alcohol/week: 0.0 standard drinks  . Drug use: No  . Sexual activity: Yes  Other Topics Concern  . Not on file  Social History Narrative   Son and granddaughter   Social Determinants of Health   Financial Resource Strain:   . Difficulty of Paying Living Expenses:   Food Insecurity: Food Insecurity Present  . Worried About Charity fundraiser in the Last Year: Sometimes true  . Ran Out of Food in the Last Year: Not on file  Transportation Needs:   . Lack of Transportation (Medical):   Marland Kitchen Lack of Transportation (Non-Medical):   Physical Activity: Insufficiently Active  . Days of Exercise per Week: 3 days  . Minutes of Exercise per Session: 30 min  Stress:   . Feeling of Stress :   Social Connections:   . Frequency of Communication with Friends and Family:   . Frequency of Social Gatherings with Friends and Family:   . Attends Religious Services:   . Active Member of Clubs or Organizations:   . Attends Archivist Meetings:   Marland Kitchen Marital Status:      Family History: The patient's family history includes Aneurysm in her maternal grandmother and mother; Anxiety disorder in her grandchild; Bipolar disorder in her grandchild and sister; Breast cancer (age of onset: 54) in her paternal grandmother; Depression in her grandchild.  ROS:   Please see the history of present illness.     All other systems reviewed and are negative.  EKGs/Labs/Other Studies Reviewed:      The following studies were reviewed today:   EKG:  EKG is  ordered today.  The ekg ordered today demonstrates sinus bradycardia, heart rate 53.  Recent Labs: 11/11/2018: BUN 6; Creatinine, Ser 0.90; Magnesium 1.9; Potassium 4.0; Sodium 138  Recent Lipid Panel    Component  Value Date/Time   CHOL 217 (H) 12/25/2017 1222   TRIG 131 12/25/2017 1222   HDL 68 12/25/2017 1222   CHOLHDL 3.2 12/25/2017 1222   LDLCALC 123 (H) 12/25/2017 1222    Physical Exam:    VS:  BP 122/80 (BP Location: Left Arm, Patient Position: Sitting, Cuff Size: Normal)   Pulse (!) 55   Ht 5\' 3"  (1.6 m)   Wt 156 lb 2 oz (70.8 kg)   SpO2 94%   BMI 27.66 kg/m     Wt Readings from Last 3 Encounters:  05/29/19 156 lb 2 oz (70.8 kg)  05/02/19 159 lb 12 oz (72.5 kg)  04/15/19 155 lb (70.3 kg)     GEN:  Well nourished, well developed in no acute distress HEENT: Normal NECK: No JVD; No carotid bruits LYMPHATICS: No lymphadenopathy CARDIAC: RRR, no murmurs, rubs, gallops RESPIRATORY:  Clear to auscultation without rales, wheezing or rhonchi  ABDOMEN: Soft, non-tender, non-distended MUSCULOSKELETAL:  No edema; No deformity  SKIN: Warm and dry NEUROLOGIC:  Alert and oriented x 3 PSYCHIATRIC:  Normal affect   ASSESSMENT:    1. Paroxysmal A-fib ([place])   2. Essential hypertension   3. Hx of anxiety disorder    PLAN:    In order of problems listed above:  1. Patient with worsening symptoms of palpitations.  Cardiac monitor showed paroxysmal atrial fibrillation, about 3% burden.  CHA2DS2-VASc score 2.  Continue Toprol-XL, start Eliquis 5 mg twice daily, get repeat echocardiogram. 2. History of hypertension, blood pressure appears controlled.  Continue Toprol-XL as prescribed. 3. Patient with history of anxiety.  Episodes usually cause tachycardia.  Recommend she follows up with primary care provider and or psychiatry for adequate management.  Follow-up after echocardiogram.  This note was generated in  part or whole with voice recognition software. Voice recognition is usually quite accurate but there are transcription errors that can and very often do occur. I apologize for any typographical errors that were not detected and corrected.  Medication Adjustments/Labs and Tests Ordered: Current medicines are reviewed at length with the patient today.  Concerns regarding medicines are outlined above.  Orders Placed This Encounter  Procedures  . EKG 12-Lead  . ECHOCARDIOGRAM COMPLETE   No orders of the defined types were placed in this encounter.   Patient Instructions  Medication Instructions:  Your physician recommends that you continue on your current medications as directed. Please refer to the Current Medication list given to you today.  *If you need a refill on your cardiac medications before your next appointment, please call your pharmacy*   Lab Work: none If you have labs (blood work) drawn today and your tests are completely normal, you will receive your results only by: Marland Kitchen MyChart Message (if you have MyChart) OR . A paper copy in the mail If you have any lab test that is abnormal or we need to change your treatment, we will call you to review the results.   Testing/Procedures: Your physician has requested that you have an echocardiogram. Echocardiography is a painless test that uses sound waves to create images of your heart. It provides your doctor with information about the size and shape of your heart and how well your heart's chambers and valves are working. This procedure takes approximately one hour. There are no restrictions for this procedure. You may get an IV, if needed, to receive an ultrasound enhancing agent through to better visualize your heart.   Follow-Up: At [facility], you and your health  needs are our priority.  As part of our continuing mission to provide you with exceptional heart care, we have created designated Provider Care Teams.  These Care Teams  include your primary Cardiologist (physician) and Advanced Practice Providers (APPs -  Physician Assistants and Nurse Practitioners) who all work together to provide you with the care you need, when you need it.  We recommend signing up for the patient portal called "MyChart".  Sign up information is provided on this After Visit Summary.  MyChart is used to connect with patients for Virtual Visits (Telemedicine).  Patients are able to view lab/test results, encounter notes, upcoming appointments, etc.  Non-urgent messages can be sent to your provider as well.   To learn more about what you can do with MyChart, go to [email or web address].    Your next appointment:   After echo.  The format for your next appointment:   In Person  Provider:   Kate Sable, MD    Echocardiogram An echocardiogram is a procedure that uses painless sound waves (ultrasound) to produce an image of the heart. Images from an echocardiogram can provide important information about:  Signs of coronary artery disease (CAD).  Aneurysm detection. An aneurysm is a weak or damaged part of an artery wall that bulges out from the normal force of blood pumping through the body.  Heart size and shape. Changes in the size or shape of the heart can be associated with certain conditions, including heart failure, aneurysm, and CAD.  Heart muscle function.  Heart valve function.  Signs of a past heart attack.  Fluid buildup around the heart.  Thickening of the heart muscle.  A tumor or infectious growth around the heart valves. Tell a health care provider about:  Any allergies you have.  All medicines you are taking, including vitamins, herbs, eye drops, creams, and over-the-counter medicines.  Any blood disorders you have.  Any surgeries you have had.  Any medical conditions you have.  Whether you are pregnant or may be pregnant. What are the risks? Generally, this is a safe procedure. However, problems  may occur, including:  Allergic reaction to dye (contrast) that may be used during the procedure. What happens before the procedure? No specific preparation is needed. You may eat and drink normally. What happens during the procedure?   An IV tube may be inserted into one of your veins.  You may receive contrast through this tube. A contrast is an injection that improves the quality of the pictures from your heart.  A gel will be applied to your chest.  A wand-like tool (transducer) will be moved over your chest. The gel will help to transmit the sound waves from the transducer.  The sound waves will harmlessly bounce off of your heart to allow the heart images to be captured in real-time motion. The images will be recorded on a computer. The procedure may vary among health care providers and hospitals. What happens after the procedure?  You may return to your normal, everyday life, including diet, activities, and medicines, unless your health care provider tells you not to do that. Summary  An echocardiogram is a procedure that uses painless sound waves (ultrasound) to produce an image of the heart.  Images from an echocardiogram can provide important information about the size and shape of your heart, heart muscle function, heart valve function, and fluid buildup around your heart.  You do not need to do anything to prepare before this procedure. You may eat and  drink normally.  After the echocardiogram is completed, you may return to your normal, everyday life, unless your health care provider tells you not to do that. This information is not intended to replace advice given to you by your health care provider. Make sure you discuss any questions you have with your health care provider. Document Revised: 06/20/2018 Document Reviewed: 04/01/2016 Elsevier Patient Education  2020 [place], Kate Sable, MD  05/29/2019 12:32 PM    [place]

## 2019-06-02 ENCOUNTER — Telehealth: Payer: Self-pay | Admitting: Cardiology

## 2019-06-02 DIAGNOSIS — I480 Paroxysmal atrial fibrillation: Secondary | ICD-10-CM

## 2019-06-02 NOTE — Telephone Encounter (Signed)
Dr. Garen Lah, please advise on referral to EP. THanks!

## 2019-06-02 NOTE — Telephone Encounter (Signed)
No answer. Left message to call back.   

## 2019-06-02 NOTE — Telephone Encounter (Signed)
Please call to discuss EP appointment patient has decided she would like to see Dr. Caryl Comes.

## 2019-06-02 NOTE — Telephone Encounter (Signed)
Patient calling back. I let her know I am making sure ok for the referral from Dr. Garen Lah. She said they had discussed it at office visit and at that time she wanted to see about eliminating the stress factor which she thought would be contributing. This patient weekend she was able to see that it may not be all the stress causing the issue. She is aware he is out of office until Wednesday and once I get the ok, I will place referral and scheduling will call her. She was appreciative.

## 2019-06-05 NOTE — Telephone Encounter (Signed)
Reviewed the patient with Dr. Caryl Comes- ok to add on to his schedule on Tuesday 3/30 @ 8:40 am or Tuesday 4/6 @ 8:40 am.   Will forward to scheduling to please call and set the patient up for a Consult appt.

## 2019-06-05 NOTE — Telephone Encounter (Signed)
Kate Sable, MD  You 16 hours ago (5:54 PM)   Faythe Ghee to refer patient to EP/Dr. Caryl Comes. Thank you   Routing comment     Patient notified and appreciative.  States she is having shortness of breath more often and it is interfering with her sleep. She is willing to go to [address] if necessary to get in sooner. Advised I will route to EP scheduler and Nira Conn to reach out to her for first available appointments.

## 2019-06-05 NOTE — Telephone Encounter (Signed)
Will forward to Dr. Caryl Comes to see if the patient can go ahead and be seen or does her echo need to be done first.  Echo scheduled for 07/09/19.

## 2019-06-06 ENCOUNTER — Ambulatory Visit: Payer: Medicare Other | Attending: Internal Medicine

## 2019-06-06 DIAGNOSIS — Z23 Encounter for immunization: Secondary | ICD-10-CM

## 2019-06-06 NOTE — Progress Notes (Signed)
   Covid-19 Vaccination Clinic  Name:  [name]    MRN: [id number] DOB: 12/31/1960  06/06/2019  [name] was observed post Covid-19 immunization for 15 minutes without incident. She was provided with Vaccine Information Sheet and instruction to access the V-Safe system.   [name] was instructed to call 911 with any severe reactions post vaccine: Marland Kitchen Difficulty breathing  . Swelling of face and throat  . A fast heartbeat  . A bad rash all over body  . Dizziness and weakness   Immunizations Administered    Name Date Dose VIS Date Route   Pfizer COVID-19 Vaccine 06/06/2019 12:31 PM 0.3 mL 02/21/2019 Intramuscular   Manufacturer: [place]   Lot: [id number]   [place]: [id number]

## 2019-06-09 NOTE — Progress Notes (Signed)
ELECTROPHYSIOLOGY CONSULT NOTE  Patient ID: [name], MRN: [id number], DOB/AGE: 1960-09-13 59 y.o. Admit date: (Not on file) Date of Consult: 06/10/2019  Primary Physician: Virginia Crews, MD Primary Cardiologist:  [name]     [name] is a 59 y.o. female who is being seen today for the evaluation of afib  at the request of [name].    HPI [name] is a 59 y.o. female referred for atrial fibrillation detected on monitor.  She was complaining of palpitations.  A monitor detected atrial fibrillation as well as nonsustained atrial tachycardia.  She was begun on Eliquis.  She has already been on longstanding metoprolol.  Her bigger complaint is progressive dyspnea on exertion.  This is been worse over the last 6 months in particular the last 2 months.  She describes chest tightness midsternally radiating to her neck into her left arm accompanied by shortness of breath.  Her ability to ambulate is down to about 50 feet without symptoms.  She has had no chest pain at rest. DATE TEST EF   2/15 Echo   60-65 %               Event Recorder personnally reviewed episodes of atrial fibrillation with a rapid ventricular response Nonsustained atrial tachycardia Thromboembolic risk factors (, HTN-1,  Gender-1) for a CHADSVASc Score of >= 2   Past Medical History:  Diagnosis Date  . Abnormal EKG    HX OF INVERTED T WAVES ON EKG, PALPITATIONS, CHEST PAINS-CARDIAC WORK UP DID NOT SHOW ANY HEART DISEASE  . Acute postoperative pain 01/04/2017  . 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 Anemia    Iron Infusion-8 yrs ago  . Anxiety   . Asthma    INHALERS WITH FLARE -UPS  . Cephalalgia 08/18/2014  . Cervical disc disease 08/18/2014   Needs neck surgery.   . Chronic headaches   . DDD (degenerative disc disease)    CERVICAL AND LUMBAR-CHRONIC PAIN, RT HIP LABRAL TEAR  . Depression    PT STATES A LOT OF STRESS IN HER LIFE  . Dissociative disorder   . Dizziness 04/22/2013  . 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 GERD (gastroesophageal reflux disease)   . H/O arthrodesis 08/18/2014  . Headache(784.0)    AND NECK PAIN--STATES RECENT TEST SHOW CERVICAL DEGENERATION  . History of blood transfusion    s/p back surgery  . History of cervical spinal surgery 01/04/2015  . History of kidney stones   . Hypertension   . Inverted T wave   . Narrowing of intervertebral disc space 08/18/2014   Currently on disability.   . Orthostatic hypotension 04/22/2013  . Pain    CHRONIC NECK AND BACK PAIN - LIMITED ROM NECK - S/P FUSIONS CERVICAL AND LUMBAR  . Pneumonia   . PONV (postoperative nausea and vomiting)    PT GIVES HX OF N&V AND FEVER WITH SURGERIES YEARS AGO--BUT NO PROBLEMS WITH MORE RECENT SURGERIES--STATES NOT MALIGNANT HYPERTHERMIA  . Postop Hyponatremia 05/14/2012  . Postoperative anemia due to acute blood loss 05/14/2012  . PTSD (post-traumatic stress disorder)   . Right foot drop   . 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       Surgical History:  Past Surgical History:  Procedure Laterality Date  . ABDOMINAL HYSTERECTOMY    . ANTERIOR CERVICAL DECOMP/DISCECTOMY FUSION N/A 10/30/2012   Procedure: ACDF C5-6, EXPLORATION AND HARDWARE  REMOVAL C6-7;  Surgeon: Melina Schools, MD;  Location: [place];  Service: Orthopedics;  Laterality: N/A;  . ANTERIOR FUSION CERVICAL SPINE  MAY 2012   AT [facility]  . BACK SURGERY  2009   LUMBAR FUSION WITH RODS   . BREAST BIOPSY Right 2008   benign.- bx/clip  . BREAST EXCISIONAL BIOPSY Left 1998   benign  . BREAST REDUCTION SURGERY Bilateral 06/2016  . CARPAL TUNNEL RELEASE  05-06-12   Right  . CHOLECYSTECTOMY    . COLONOSCOPY WITH PROPOFOL N/A 01/26/2017   Procedure: COLONOSCOPY WITH PROPOFOL;  Surgeon: Lucilla Lame, MD;  Location: [place];  Service: Endoscopy;  Laterality: N/A;  . DIAGNOSTIC LAPAROSCOPIES - MULTIPLE FOR ENDOMETRIOSIS    . ESOPHAGEAL DILATION N/A  01/26/2017   Procedure: ESOPHAGEAL DILATION;  Surgeon: Lucilla Lame, MD;  Location: [place];  Service: Endoscopy;  Laterality: N/A;  . ESOPHAGEAL MANOMETRY N/A 05/30/2017   Procedure: ESOPHAGEAL MANOMETRY (EM);  Surgeon: Lucilla Lame, MD;  Location: [facility];  Service: Endoscopy;  Laterality: N/A;  . ESOPHAGOGASTRODUODENOSCOPY (EGD) WITH PROPOFOL N/A 01/26/2017   Procedure: ESOPHAGOGASTRODUODENOSCOPY (EGD) WITH PROPOFOL;  Surgeon: Lucilla Lame, MD;  Location: [place];  Service: Endoscopy;  Laterality: N/A;  . HIP ARTHROSCOPY  09/20/2011   Procedure: ARTHROSCOPY HIP;  Surgeon: Gearlean Alf, MD;  Location: [facility];  Service: Orthopedics;  Laterality: Right;  Right Hip Scope with Labral Debridement  . NASAL SEPTUM SURGERY  MARCH 2013   IN [place]  . [place] IMPEDANCE STUDY N/A 05/30/2017   Procedure: [place] IMPEDANCE STUDY;  Surgeon: Lucilla Lame, MD;  Location: [facility];  Service: Endoscopy;  Laterality: N/A;  . POLYPECTOMY N/A 01/26/2017   Procedure: POLYPECTOMY;  Surgeon: Lucilla Lame, MD;  Location: [place];  Service: Endoscopy;  Laterality: N/A;  . POSTERIOR CERVICAL FUSION/FORAMINOTOMY N/A 04/16/2013   Procedure: REMOVAL CERVICAL PLATES AND INTERBODY CAGE/POSTERIOR CERVICAL SPINAL FUSION C4 - C6/C5 CORPECTOMY/C4 - C6 FUSION WITH ILIAC CREST BONE GRAFT;  Surgeon: Melina Schools, MD;  Location: [place];  Service: Orthopedics;  Laterality: N/A;  . RADIOFREQUENCY ABLATION NERVES    . REDUCTION MAMMAPLASTY Bilateral 05/2016  . RIGHT HIP ARTHROSCOPY FOR LABRAL TEAR  ABOUT 2010   2012 also  . SHOULDER ARTHROSCOPY  05-06-12   bone spur  . TONSILLECTOMY     AS A CHILD  . TOTAL HIP ARTHROPLASTY Right 05/13/2012   Procedure: TOTAL HIP ARTHROPLASTY ANTERIOR APPROACH;  Surgeon: Gearlean Alf, MD;  Location: [facility];  Service: Orthopedics;  Laterality: Right;     Home Meds: Current Meds  Medication Sig  . acetaminophen (TYLENOL) 500 MG tablet Take 500 mg by mouth  every 6 (six) hours as needed.  Marland Kitchen albuterol (PROVENTIL HFA) 108 (90 Base) MCG/ACT inhaler Inhale 1-2 puffs into the lungs every 4 (four) hours as needed.   Marland Kitchen apixaban (ELIQUIS) 5 MG TABS tablet Take 1 tablet (5 mg total) by mouth 2 (two) times daily.  . calcium carbonate (TUMS - DOSED IN MG ELEMENTAL CALCIUM) 500 MG chewable tablet Chew 1 tablet by mouth 3 (three) times daily with meals. As needed  . Docusate Sodium (COLACE PO) Take by mouth daily.   . Fluticasone-Salmeterol (ADVAIR) 500-50 MCG/DOSE AEPB Inhale 1 puff into the lungs 2 (two) times daily. As needed  . ibuprofen (ADVIL) 200 MG tablet Take 200 mg by mouth every 6 (six) hours as needed.  . metoprolol succinate (TOPROL-XL) 50 MG 24 hr tablet TAKE 1 TABLET BY MOUTH EVERY DAY  .  montelukast (SINGULAIR) 10 MG tablet TAKE 1 TABLET BY MOUTH EVERY DAY  . omeprazole (PRILOSEC) 40 MG capsule Take 40 mg by mouth in the morning and at bedtime.  . ondansetron (ZOFRAN) 4 MG tablet TAKE 1 TABLET BY MOUTH EVERY 8 HOURS AS NEEDED FOR NAUSEA AND VOMITING  . Oxycodone HCl 10 MG TABS Take 1 tablet (10 mg total) by mouth every 6 (six) hours as needed. Must last 30 days  . [START ON 06/25/2019] Oxycodone HCl 10 MG TABS Take 1 tablet (10 mg total) by mouth every 6 (six) hours as needed. Must last 30 days  . [START ON 07/25/2019] Oxycodone HCl 10 MG TABS Take 1 tablet (10 mg total) by mouth every 6 (six) hours as needed. Must last 30 days  . pantoprazole (PROTONIX) 40 MG tablet Take 1 tablet (40 mg total) by mouth 2 (two) times daily.  Marland Kitchen Spacer/Aero-Holding Chambers (AEROCHAMBER MV) inhaler by Does not apply route.  . valACYclovir (VALTREX) 1000 MG tablet TAKE TWO TABLETS BY MOUTH TWICE DAILY FOR ONE DAY FEVER BLISTER    Allergies:  Allergies  Allergen Reactions  . Amoxicillin Hives  . Chlorhexidine Gluconate Dermatitis and Hives  . Clindamycin Hives  . Codeine Hives  . Erythromycin Hives    "mycins" in general  . Penicillin G Hives    "cillins" in  general  . Sulfa Antibiotics Nausea And Vomiting and Hives       . Levofloxacin Hives  . Shellfish Allergy Hives  . Decadron [Dexamethasone] Other (See Comments)    Hot flashes, insomnia, "manic" Hot flashes, insomnia, "manic"  . Mangifera Indica Hives    papaya  . Papaya Derivatives Hives  . Betadine [Povidone Iodine] Hives       . Chlorhexidine Hives       . Clarithromycin Hives  . Clindamycin/Lincomycin Hives  . Other Hives     Mango   . Povidone-Iodine Hives  . Prednisone Anxiety    High blood pressure, flushed, mood changes, heart palpitations High blood pressure, flushed, mood changes, heart palpitations    Social History   Socioeconomic History  . Marital status: Married    Spouse name: bruce  . Number of children: 2  . Years of education: Not on file  . Highest education level: Associate degree: occupational, Hotel manager, or vocational program  Occupational History    Comment: disabled  Tobacco Use  . Smoking status: Never Smoker  . Smokeless tobacco: Never Used  Substance and Sexual Activity  . Alcohol use: No    Alcohol/week: 0.0 standard drinks  . Drug use: No  . Sexual activity: Yes  Other Topics Concern  . Not on file  Social History Narrative   Son and granddaughter   Social Determinants of Health   Financial Resource Strain:   . Difficulty of Paying Living Expenses:   Food Insecurity: Food Insecurity Present  . Worried About Charity fundraiser in the Last Year: Sometimes true  . Ran Out of Food in the Last Year: Not on file  Transportation Needs:   . Lack of Transportation (Medical):   Marland Kitchen Lack of Transportation (Non-Medical):   Physical Activity: Insufficiently Active  . Days of Exercise per Week: 3 days  . Minutes of Exercise per Session: 30 min  Stress:   . Feeling of Stress :   Social Connections:   . Frequency of Communication with Friends and Family:   . Frequency of Social Gatherings with Friends and Family:   . Attends Religious  Services:   . Active Member of Clubs or Organizations:   . Attends Archivist Meetings:   Marland Kitchen Marital Status:   Intimate Partner Violence: Unknown  . Fear of Current or Ex-Partner: Not on file  . Emotionally Abused: No  . Physically Abused: No  . Sexually Abused: Not on file     Family History  Problem Relation Age of Onset  . Aneurysm Mother   . Aneurysm Maternal Grandmother   . Breast cancer Paternal Grandmother 25  . Bipolar disorder Sister   . Bipolar disorder Grandchild   . Anxiety disorder Grandchild   . Depression Grandchild      ROS:  Please see the history of present illness.     All other systems reviewed and negative.    Physical Exam: Blood pressure (!) 160/100, pulse (!) 53, height 5\' 4"  (1.626 m), weight 154 lb 8 oz (70.1 kg), SpO2 98 %. General: Well developed, well nourished female in no acute distress. Head: Normocephalic, atraumatic, sclera non-icteric, no xanthomas, nares are without discharge. EENT: normal  Lymph Nodes:  none Neck: Negative for carotid bruits. JVD 8+HJR Back:without scoliosis kyphosis Lungs: Clear bilaterally to auscultation without wheezes, rales, or rhonchi. Breathing is unlabored. Heart: RRR with S1 S2. No  murmur . No rubs, or gallops appreciated. Abdomen: Soft, non-tender, non-distended with normoactive bowel sounds. No hepatomegaly. No rebound/guarding. No obvious abdominal masses. Msk:  Strength and tone appear normal for age. Extremities: No clubbing or cyanosis. tr + edema.  Distal pedal pulses are 2+ and equal bilaterally. Skin: Warm and Dry Neuro: Alert and oriented X 3. CN III-XII intact Grossly normal sensory and motor function . Psych:  Responds to questions appropriately with a normal affect.      Labs: Cardiac Enzymes No results for input(s): CKTOTAL, CKMB, TROPONINI in the last 72 hours. CBC Lab Results  Component Value Date   WBC 6.6 12/25/2017   HGB 12.1 12/25/2017   HCT 37.1 12/25/2017   MCV 83  12/25/2017   PLT 426 12/25/2017   PROTIME: No results for input(s): LABPROT, INR in the last 72 hours. Chemistry No results for input(s): NA, K, CL, CO2, BUN, CREATININE, CALCIUM, PROT, BILITOT, ALKPHOS, ALT, AST, GLUCOSE in the last 168 hours.  Invalid input(s): LABALBU Lipids Lab Results  Component Value Date   CHOL 217 (H) 12/25/2017   HDL 68 12/25/2017   LDLCALC 123 (H) 12/25/2017   TRIG 131 12/25/2017   BNP No results found for: PROBNP Thyroid Function Tests: No results for input(s): TSH, T4TOTAL, T3FREE, THYROIDAB in the last 72 hours.  Invalid input(s): FREET3 Miscellaneous No results found for: DDIMER  Radiology/Studies:  No results found.  EKG: sinus @ 53  13/[birth year]45  Assessment and Plan:  Atrial fibrillation-paroxysmal  Atrial tachycardia-nonsustained  Chest pain-crescendo consistent with angina  Dyspnea on exertion/orthopnea  Psychosocial stress-significant  Hypertension  Asthma  GE reflux disease   Patient has atrial fibrillation which will require anticoagulation.  Her CHA2DS2-VASc score is at least 2.  Given her young age, we have discussed the role of catheter ablation.  However, her crescendo chest discomfort and significantly limited exertion secondary to chest pain and dyspnea is concerning.  Her story is sufficiently so that I think the pretest probability is too high to feel comfortable with noninvasive imaging to exclude coronary disease.  I have discussed her case with Dr. Saunders Revel.  He will follow up with her today regarding catheterization.  She is volume overloaded.  I recommended that we  begin diuretics for a week every other day.  Ejection fraction would further inform medication options and thromboembolic risk.  She has a history of asthma but today her lungs are clear and there is no expiratory prolongation.  Her reflux symptoms in her mind are quite distinct from her exertional symptoms.  She is taking a PPI twice a day.   Virl Axe

## 2019-06-09 NOTE — H&P (View-Only) (Signed)
ELECTROPHYSIOLOGY CONSULT NOTE  Patient ID: [name], MRN: [id number], DOB/AGE: 1960/04/05 59 y.o. Admit date: (Not on file) Date of Consult: 06/10/2019  Primary Physician: Virginia Crews, MD Primary Cardiologist:  [facility]E     [name] is a 59 y.o. female who is being seen today for the evaluation of afib  at the request of BAE.    HPI [name] is a 59 y.o. female referred for atrial fibrillation detected on monitor.  She was complaining of palpitations.  A monitor detected atrial fibrillation as well as nonsustained atrial tachycardia.  She was begun on Eliquis.  She has already been on longstanding metoprolol.  Her bigger complaint is progressive dyspnea on exertion.  This is been worse over the last 6 months in particular the last 2 months.  She describes chest tightness midsternally radiating to her neck into her left arm accompanied by shortness of breath.  Her ability to ambulate is down to about 50 feet without symptoms.  She has had no chest pain at rest. DATE TEST EF   2/15 Echo   60-65 %               Event Recorder personnally reviewed episodes of atrial fibrillation with a rapid ventricular response Nonsustained atrial tachycardia Thromboembolic risk factors (, HTN-1,  Gender-1) for a CHADSVASc Score of >= 2   Past Medical History:  Diagnosis Date  . Abnormal EKG    HX OF INVERTED T WAVES ON EKG, PALPITATIONS, CHEST PAINS-CARDIAC WORK UP DID NOT SHOW ANY HEART DISEASE  . Acute postoperative pain 01/04/2017  . 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 Anemia    Iron Infusion-8 yrs ago  . Anxiety   . Asthma    INHALERS WITH FLARE -UPS  . Cephalalgia 08/18/2014  . Cervical disc disease 08/18/2014   Needs neck surgery.   . Chronic headaches   . DDD (degenerative disc disease)    CERVICAL AND LUMBAR-CHRONIC PAIN, RT HIP LABRAL TEAR  . Depression    PT STATES A LOT OF STRESS IN HER LIFE  . Dissociative disorder   . Dizziness 04/22/2013  . Duodenal  ulcer with hemorrhage and perforation ([place]) 04/27/2003  . GERD (gastroesophageal reflux disease)   . H/O arthrodesis 08/18/2014  . Headache(784.0)    AND NECK PAIN--STATES RECENT TEST SHOW CERVICAL DEGENERATION  . History of blood transfusion    s/p back surgery  . History of cervical spinal surgery 01/04/2015  . History of kidney stones   . Hypertension   . Inverted T wave   . Narrowing of intervertebral disc space 08/18/2014   Currently on disability.   . Orthostatic hypotension 04/22/2013  . Pain    CHRONIC NECK AND BACK PAIN - LIMITED ROM NECK - S/P FUSIONS CERVICAL AND LUMBAR  . Pneumonia   . PONV (postoperative nausea and vomiting)    PT GIVES HX OF N&V AND FEVER WITH SURGERIES YEARS AGO--BUT NO PROBLEMS WITH MORE RECENT SURGERIES--STATES NOT MALIGNANT HYPERTHERMIA  . Postop Hyponatremia 05/14/2012  . Postoperative anemia due to acute blood loss 05/14/2012  . PTSD (post-traumatic stress disorder)   . Right foot drop   . 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       Surgical History:  Past Surgical History:  Procedure Laterality Date  . ABDOMINAL HYSTERECTOMY    . ANTERIOR CERVICAL DECOMP/DISCECTOMY FUSION N/A 10/30/2012   Procedure: ACDF C5-6, EXPLORATION AND HARDWARE  REMOVAL C6-7;  Surgeon: Melina Schools, MD;  Location: [place];  Service: Orthopedics;  Laterality: N/A;  . ANTERIOR FUSION CERVICAL SPINE  MAY 2012   AT [facility]  . BACK SURGERY  2009   LUMBAR FUSION WITH RODS   . BREAST BIOPSY Right 2008   benign.- bx/clip  . BREAST EXCISIONAL BIOPSY Left 1998   benign  . BREAST REDUCTION SURGERY Bilateral 06/2016  . CARPAL TUNNEL RELEASE  05-06-12   Right  . CHOLECYSTECTOMY    . COLONOSCOPY WITH PROPOFOL N/A 01/26/2017   Procedure: COLONOSCOPY WITH PROPOFOL;  Surgeon: Lucilla Lame, MD;  Location: [place];  Service: Endoscopy;  Laterality: N/A;  . DIAGNOSTIC LAPAROSCOPIES - MULTIPLE FOR ENDOMETRIOSIS    . ESOPHAGEAL DILATION N/A  01/26/2017   Procedure: ESOPHAGEAL DILATION;  Surgeon: Lucilla Lame, MD;  Location: [place];  Service: Endoscopy;  Laterality: N/A;  . ESOPHAGEAL MANOMETRY N/A 05/30/2017   Procedure: ESOPHAGEAL MANOMETRY (EM);  Surgeon: Lucilla Lame, MD;  Location: [facility];  Service: Endoscopy;  Laterality: N/A;  . ESOPHAGOGASTRODUODENOSCOPY (EGD) WITH PROPOFOL N/A 01/26/2017   Procedure: ESOPHAGOGASTRODUODENOSCOPY (EGD) WITH PROPOFOL;  Surgeon: Lucilla Lame, MD;  Location: [place];  Service: Endoscopy;  Laterality: N/A;  . HIP ARTHROSCOPY  09/20/2011   Procedure: ARTHROSCOPY HIP;  Surgeon: Gearlean Alf, MD;  [facility];  Service: Orthopedics;  Laterality: Right;  Right Hip Scope with Labral Debridement  . NASAL SEPTUM SURGERY  MARCH 2013   IN [place]  . [place] IMPEDANCE STUDY N/A 05/30/2017   Procedure: [place] IMPEDANCE STUDY;  Surgeon: Lucilla Lame, MD;  Location: [facility];  Service: Endoscopy;  Laterality: N/A;  . POLYPECTOMY N/A 01/26/2017   Procedure: POLYPECTOMY;  Surgeon: Lucilla Lame, MD;  Location: [place];  Service: Endoscopy;  Laterality: N/A;  . POSTERIOR CERVICAL FUSION/FORAMINOTOMY N/A 04/16/2013   Procedure: REMOVAL CERVICAL PLATES AND INTERBODY CAGE/POSTERIOR CERVICAL SPINAL FUSION C4 - C6/C5 CORPECTOMY/C4 - C6 FUSION WITH ILIAC CREST BONE GRAFT;  Surgeon: Melina Schools, MD;  Location: [place];  Service: Orthopedics;  Laterality: N/A;  . RADIOFREQUENCY ABLATION NERVES    . REDUCTION MAMMAPLASTY Bilateral 05/2016  . RIGHT HIP ARTHROSCOPY FOR LABRAL TEAR  ABOUT 2010   2012 also  . SHOULDER ARTHROSCOPY  05-06-12   bone spur  . TONSILLECTOMY     AS A CHILD  . TOTAL HIP ARTHROPLASTY Right 05/13/2012   Procedure: TOTAL HIP ARTHROPLASTY ANTERIOR APPROACH;  Surgeon: Gearlean Alf, MD;  [facility];  Service: Orthopedics;  Laterality: Right;     Home Meds: Current Meds  Medication Sig  . acetaminophen (TYLENOL) 500 MG tablet Take 500 mg by mouth  every 6 (six) hours as needed.  Marland Kitchen albuterol (PROVENTIL HFA) 108 (90 Base) MCG/ACT inhaler Inhale 1-2 puffs into the lungs every 4 (four) hours as needed.   Marland Kitchen apixaban (ELIQUIS) 5 MG TABS tablet Take 1 tablet (5 mg total) by mouth 2 (two) times daily.  . calcium carbonate (TUMS - DOSED IN MG ELEMENTAL CALCIUM) 500 MG chewable tablet Chew 1 tablet by mouth 3 (three) times daily with meals. As needed  . Docusate Sodium (COLACE PO) Take by mouth daily.   . Fluticasone-Salmeterol (ADVAIR) 500-50 MCG/DOSE AEPB Inhale 1 puff into the lungs 2 (two) times daily. As needed  . ibuprofen (ADVIL) 200 MG tablet Take 200 mg by mouth every 6 (six) hours as needed.  . metoprolol succinate (TOPROL-XL) 50 MG 24 hr tablet TAKE 1 TABLET BY MOUTH EVERY DAY  .  montelukast (SINGULAIR) 10 MG tablet TAKE 1 TABLET BY MOUTH EVERY DAY  . omeprazole (PRILOSEC) 40 MG capsule Take 40 mg by mouth in the morning and at bedtime.  . ondansetron (ZOFRAN) 4 MG tablet TAKE 1 TABLET BY MOUTH EVERY 8 HOURS AS NEEDED FOR NAUSEA AND VOMITING  . Oxycodone HCl 10 MG TABS Take 1 tablet (10 mg total) by mouth every 6 (six) hours as needed. Must last 30 days  . [START ON 06/25/2019] Oxycodone HCl 10 MG TABS Take 1 tablet (10 mg total) by mouth every 6 (six) hours as needed. Must last 30 days  . [START ON 07/25/2019] Oxycodone HCl 10 MG TABS Take 1 tablet (10 mg total) by mouth every 6 (six) hours as needed. Must last 30 days  . pantoprazole (PROTONIX) 40 MG tablet Take 1 tablet (40 mg total) by mouth 2 (two) times daily.  Marland Kitchen Spacer/Aero-Holding Chambers (AEROCHAMBER MV) inhaler by Does not apply route.  . valACYclovir (VALTREX) 1000 MG tablet TAKE TWO TABLETS BY MOUTH TWICE DAILY FOR ONE DAY FEVER BLISTER    Allergies:  Allergies  Allergen Reactions  . Amoxicillin Hives  . Chlorhexidine Gluconate Dermatitis and Hives  . Clindamycin Hives  . Codeine Hives  . Erythromycin Hives    "mycins" in general  . Penicillin G Hives    "cillins" in  general  . Sulfa Antibiotics Nausea And Vomiting and Hives       . Levofloxacin Hives  . Shellfish Allergy Hives  . Decadron [Dexamethasone] Other (See Comments)    Hot flashes, insomnia, "manic" Hot flashes, insomnia, "manic"  . Mangifera Indica Hives    papaya  . Papaya Derivatives Hives  . Betadine [Povidone Iodine] Hives       . Chlorhexidine Hives       . Clarithromycin Hives  . Clindamycin/Lincomycin Hives  . Other Hives     Mango   . Povidone-Iodine Hives  . Prednisone Anxiety    High blood pressure, flushed, mood changes, heart palpitations High blood pressure, flushed, mood changes, heart palpitations    Social History   Socioeconomic History  . Marital status: Married    Spouse name: bruce  . Number of children: 2  . Years of education: Not on file  . Highest education level: Associate degree: occupational, Hotel manager, or vocational program  Occupational History    Comment: disabled  Tobacco Use  . Smoking status: Never Smoker  . Smokeless tobacco: Never Used  Substance and Sexual Activity  . Alcohol use: No    Alcohol/week: 0.0 standard drinks  . Drug use: No  . Sexual activity: Yes  Other Topics Concern  . Not on file  Social History Narrative   Son and granddaughter   Social Determinants of Health   Financial Resource Strain:   . Difficulty of Paying Living Expenses:   Food Insecurity: Food Insecurity Present  . Worried About Charity fundraiser in the Last Year: Sometimes true  . Ran Out of Food in the Last Year: Not on file  Transportation Needs:   . Lack of Transportation (Medical):   Marland Kitchen Lack of Transportation (Non-Medical):   Physical Activity: Insufficiently Active  . Days of Exercise per Week: 3 days  . Minutes of Exercise per Session: 30 min  Stress:   . Feeling of Stress :   Social Connections:   . Frequency of Communication with Friends and Family:   . Frequency of Social Gatherings with Friends and Family:   . Attends Religious  Services:   . Active Member of Clubs or Organizations:   . Attends Archivist Meetings:   Marland Kitchen Marital Status:   Intimate Partner Violence: Unknown  . Fear of Current or Ex-Partner: Not on file  . Emotionally Abused: No  . Physically Abused: No  . Sexually Abused: Not on file     Family History  Problem Relation Age of Onset  . Aneurysm Mother   . Aneurysm Maternal Grandmother   . Breast cancer Paternal Grandmother 58  . Bipolar disorder Sister   . Bipolar disorder Grandchild   . Anxiety disorder Grandchild   . Depression Grandchild      ROS:  Please see the history of present illness.     All other systems reviewed and negative.    Physical Exam: Blood pressure (!) 160/100, pulse (!) 53, height 5\' 4"  (1.626 m), weight 154 lb 8 oz (70.1 kg), SpO2 98 %. General: Well developed, well nourished female in no acute distress. Head: Normocephalic, atraumatic, sclera non-icteric, no xanthomas, nares are without discharge. EENT: normal  Lymph Nodes:  none Neck: Negative for carotid bruits. JVD 8+HJR Back:without scoliosis kyphosis Lungs: Clear bilaterally to auscultation without wheezes, rales, or rhonchi. Breathing is unlabored. Heart: RRR with S1 S2. No  murmur . No rubs, or gallops appreciated. Abdomen: Soft, non-tender, non-distended with normoactive bowel sounds. No hepatomegaly. No rebound/guarding. No obvious abdominal masses. Msk:  Strength and tone appear normal for age. Extremities: No clubbing or cyanosis. tr + edema.  Distal pedal pulses are 2+ and equal bilaterally. Skin: Warm and Dry Neuro: Alert and oriented X 3. CN III-XII intact Grossly normal sensory and motor function . Psych:  Responds to questions appropriately with a normal affect.      Labs: Cardiac Enzymes No results for input(s): CKTOTAL, CKMB, TROPONINI in the last 72 hours. CBC Lab Results  Component Value Date   WBC 6.6 12/25/2017   HGB 12.1 12/25/2017   HCT 37.1 12/25/2017   MCV 83  12/25/2017   PLT 426 12/25/2017   PROTIME: No results for input(s): LABPROT, INR in the last 72 hours. Chemistry No results for input(s): NA, K, CL, CO2, BUN, CREATININE, CALCIUM, PROT, BILITOT, ALKPHOS, ALT, AST, GLUCOSE in the last 168 hours.  Invalid input(s): LABALBU Lipids Lab Results  Component Value Date   CHOL 217 (H) 12/25/2017   HDL 68 12/25/2017   LDLCALC 123 (H) 12/25/2017   TRIG 131 12/25/2017   BNP No results found for: PROBNP Thyroid Function Tests: No results for input(s): TSH, T4TOTAL, T3FREE, THYROIDAB in the last 72 hours.  Invalid input(s): FREET3 Miscellaneous No results found for: DDIMER  Radiology/Studies:  No results found.  EKG: sinus @ 53  13[birth year]  Assessment and Plan:  Atrial fibrillation-paroxysmal  Atrial tachycardia-nonsustained  Chest pain-crescendo consistent with angina  Dyspnea on exertion/orthopnea  Psychosocial stress-significant  Hypertension  Asthma  GE reflux disease   Patient has atrial fibrillation which will require anticoagulation.  Her CHA2DS2-VASc score is at least 2.  Given her young age, we have discussed the role of catheter ablation.  However, her crescendo chest discomfort and significantly limited exertion secondary to chest pain and dyspnea is concerning.  Her story is sufficiently so that I think the pretest probability is too high to feel comfortable with noninvasive imaging to exclude coronary disease.  I have discussed her case with Dr. Saunders Revel.  He will follow up with her today regarding catheterization.  She is volume overloaded.  I recommended that we  begin diuretics for a week every other day.  Ejection fraction would further inform medication options and thromboembolic risk.  She has a history of asthma but today her lungs are clear and there is no expiratory prolongation.  Her reflux symptoms in her mind are quite distinct from her exertional symptoms.  She is taking a PPI twice a day.   Virl Axe

## 2019-06-09 NOTE — Telephone Encounter (Signed)
Appointment has been scheduled for 06/10/19. Closing encounter.

## 2019-06-10 ENCOUNTER — Encounter: Payer: Self-pay | Admitting: Internal Medicine

## 2019-06-10 ENCOUNTER — Ambulatory Visit
Admission: RE | Admit: 2019-06-10 | Discharge: 2019-06-10 | Disposition: A | Discharge status: Home / Self Care | Payer: Medicare Other | Attending: Internal Medicine | Admitting: Internal Medicine

## 2019-06-10 ENCOUNTER — Other Ambulatory Visit: Payer: Self-pay

## 2019-06-10 ENCOUNTER — Other Ambulatory Visit
Admission: RE | Admit: 2019-06-10 | Discharge: 2019-06-10 | Disposition: A | Discharge status: Home / Self Care | Payer: Medicare Other | Source: Ambulatory Visit | Attending: Internal Medicine | Admitting: Internal Medicine

## 2019-06-10 ENCOUNTER — Encounter
Admission: RE | Disposition: A | Discharge status: Home / Self Care | Payer: Self-pay | Source: Home / Self Care | Attending: Internal Medicine

## 2019-06-10 ENCOUNTER — Ambulatory Visit (INDEPENDENT_AMBULATORY_CARE_PROVIDER_SITE_OTHER): Payer: Medicare Other | Admitting: Internal Medicine

## 2019-06-10 VITALS — BP 160/100 | HR 53 | Ht 64.0 in | Wt 154.5 lb

## 2019-06-10 DIAGNOSIS — Z91013 Allergy to seafood: Secondary | ICD-10-CM | POA: Insufficient documentation

## 2019-06-10 DIAGNOSIS — Z882 Allergy status to sulfonamides status: Secondary | ICD-10-CM | POA: Insufficient documentation

## 2019-06-10 DIAGNOSIS — Z01812 Encounter for preprocedural laboratory examination: Secondary | ICD-10-CM

## 2019-06-10 DIAGNOSIS — F329 Major depressive disorder, single episode, unspecified: Secondary | ICD-10-CM | POA: Insufficient documentation

## 2019-06-10 DIAGNOSIS — Z888 Allergy status to other drugs, medicaments and biological substances status: Secondary | ICD-10-CM | POA: Diagnosis not present

## 2019-06-10 DIAGNOSIS — Z880 Allergy status to penicillin: Secondary | ICD-10-CM | POA: Insufficient documentation

## 2019-06-10 DIAGNOSIS — I10 Essential (primary) hypertension: Secondary | ICD-10-CM | POA: Diagnosis not present

## 2019-06-10 DIAGNOSIS — Z20822 Contact with and (suspected) exposure to covid-19: Secondary | ICD-10-CM | POA: Diagnosis not present

## 2019-06-10 DIAGNOSIS — R079 Chest pain, unspecified: Secondary | ICD-10-CM | POA: Diagnosis not present

## 2019-06-10 DIAGNOSIS — I471 Supraventricular tachycardia: Secondary | ICD-10-CM | POA: Insufficient documentation

## 2019-06-10 DIAGNOSIS — J45909 Unspecified asthma, uncomplicated: Secondary | ICD-10-CM | POA: Diagnosis not present

## 2019-06-10 DIAGNOSIS — Z7901 Long term (current) use of anticoagulants: Secondary | ICD-10-CM | POA: Diagnosis not present

## 2019-06-10 DIAGNOSIS — Z79899 Other long term (current) drug therapy: Secondary | ICD-10-CM | POA: Diagnosis not present

## 2019-06-10 DIAGNOSIS — I480 Paroxysmal atrial fibrillation: Secondary | ICD-10-CM

## 2019-06-10 DIAGNOSIS — R0602 Shortness of breath: Secondary | ICD-10-CM

## 2019-06-10 DIAGNOSIS — K219 Gastro-esophageal reflux disease without esophagitis: Secondary | ICD-10-CM | POA: Insufficient documentation

## 2019-06-10 DIAGNOSIS — I200 Unstable angina: Secondary | ICD-10-CM | POA: Diagnosis not present

## 2019-06-10 HISTORY — PX: LEFT HEART CATH AND CORONARY ANGIOGRAPHY: CATH118249

## 2019-06-10 LAB — RESPIRATORY PANEL BY RT PCR (FLU A&B, COVID)
Influenza A by PCR: NEGATIVE
Influenza B by PCR: NEGATIVE
SARS Coronavirus 2 by RT PCR: NEGATIVE

## 2019-06-10 LAB — CBC WITH DIFFERENTIAL/PLATELET
Abs Immature Granulocytes: 0.02 10*3/uL (ref 0.00–0.07)
Basophils Absolute: 0.1 10*3/uL (ref 0.0–0.1)
Basophils Relative: 1 %
Eosinophils Absolute: 0.1 10*3/uL (ref 0.0–0.5)
Eosinophils Relative: 1 %
HCT: 39.4 % (ref 36.0–46.0)
Hemoglobin: 12.5 g/dL (ref 12.0–15.0)
Immature Granulocytes: 0 %
Lymphocytes Relative: 32 %
Lymphs Abs: 2.1 10*3/uL (ref 0.7–4.0)
MCH: 26.5 pg (ref 26.0–34.0)
MCHC: 31.7 g/dL (ref 30.0–36.0)
MCV: 83.5 fL (ref 80.0–100.0)
Monocytes Absolute: 0.3 10*3/uL (ref 0.1–1.0)
Monocytes Relative: 5 %
Neutro Abs: 4.1 10*3/uL (ref 1.7–7.7)
Neutrophils Relative %: 61 %
Platelets: 358 10*3/uL (ref 150–400)
RBC: 4.72 MIL/uL (ref 3.87–5.11)
RDW: 13.3 % (ref 11.5–15.5)
WBC: 6.7 10*3/uL (ref 4.0–10.5)
nRBC: 0 % (ref 0.0–0.2)

## 2019-06-10 LAB — BASIC METABOLIC PANEL
Anion gap: 10 (ref 5–15)
BUN: 13 mg/dL (ref 6–20)
CO2: 24 mmol/L (ref 22–32)
Calcium: 9.7 mg/dL (ref 8.9–10.3)
Chloride: 104 mmol/L (ref 98–111)
Creatinine, Ser: 0.87 mg/dL (ref 0.44–1.00)
GFR calc Af Amer: >60 mL/min (ref >60)
GFR calc non Af Amer: >60 mL/min (ref >60)
Glucose, Bld: 111 mg/dL — ABNORMAL HIGH (ref 70–99)
Potassium: 4.6 mmol/L (ref 3.5–5.1)
Sodium: 138 mmol/L (ref 135–145)

## 2019-06-10 SURGERY — LEFT HEART CATH AND CORONARY ANGIOGRAPHY
Anesthesia: Moderate Sedation | Laterality: Left

## 2019-06-10 MED ORDER — SODIUM CHLORIDE 0.9% FLUSH: 3.0000 mL | INTRAVENOUS | Status: DC | PRN

## 2019-06-10 MED ORDER — FENTANYL CITRATE (PF) 100 MCG/2ML IJ SOLN
INTRAMUSCULAR | Status: AC
  Filled 2019-06-10: qty 2

## 2019-06-10 MED ORDER — IOHEXOL 300 MG/ML  SOLN
INTRAMUSCULAR | Status: DC | PRN
  Administered 2019-06-10: 50 mL via INTRA_ARTERIAL

## 2019-06-10 MED ORDER — LABETALOL HCL 5 MG/ML IV SOLN: 10.0000 mg | INTRAVENOUS | Status: DC | PRN

## 2019-06-10 MED ORDER — HEPARIN (PORCINE) IN NACL 1000-0.9 UT/500ML-% IV SOLN
INTRAVENOUS | Status: AC
  Filled 2019-06-10: qty 1000

## 2019-06-10 MED ORDER — HYDRALAZINE HCL 20 MG/ML IJ SOLN: 10.0000 mg | INTRAMUSCULAR | Status: DC | PRN

## 2019-06-10 MED ORDER — HEPARIN (PORCINE) IN NACL 1000-0.9 UT/500ML-% IV SOLN
INTRAVENOUS | Status: DC | PRN
  Administered 2019-06-10: 500 mL

## 2019-06-10 MED ORDER — MIDAZOLAM HCL 2 MG/2ML IJ SOLN
INTRAMUSCULAR | Status: AC
  Filled 2019-06-10: qty 2

## 2019-06-10 MED ORDER — FUROSEMIDE 40 MG PO TABS: ORAL_TABLET | ORAL | 0 refills | Status: DC

## 2019-06-10 MED ORDER — SODIUM CHLORIDE 0.9 % WEIGHT BASED INFUSION
3.0000 mL/kg/h | INTRAVENOUS | Status: AC
  Administered 2019-06-10: 3 mL/kg/h via INTRAVENOUS

## 2019-06-10 MED ORDER — VERAPAMIL HCL 2.5 MG/ML IV SOLN
INTRAVENOUS | Status: AC
  Filled 2019-06-10: qty 2

## 2019-06-10 MED ORDER — MIDAZOLAM HCL 2 MG/2ML IJ SOLN
INTRAMUSCULAR | Status: DC | PRN
  Administered 2019-06-10 (×2): 1 mg via INTRAVENOUS

## 2019-06-10 MED ORDER — SODIUM CHLORIDE 0.9% FLUSH: 3.0000 mL | Freq: Two times a day (BID) | INTRAVENOUS | Status: DC

## 2019-06-10 MED ORDER — SODIUM CHLORIDE 0.9 % IV SOLN: 250.0000 mL | INTRAVENOUS | Status: DC | PRN

## 2019-06-10 MED ORDER — HEPARIN SODIUM (PORCINE) 1000 UNIT/ML IJ SOLN
INTRAMUSCULAR | Status: AC
  Filled 2019-06-10: qty 1

## 2019-06-10 MED ORDER — DIPHENHYDRAMINE HCL 25 MG PO CAPS: 25.0000 mg | ORAL_CAPSULE | Freq: Once | ORAL | Status: AC

## 2019-06-10 MED ORDER — VERAPAMIL HCL 2.5 MG/ML IV SOLN
INTRAVENOUS | Status: DC | PRN
  Administered 2019-06-10: 2.5 mg via INTRAVENOUS

## 2019-06-10 MED ORDER — SODIUM CHLORIDE 0.9 % WEIGHT BASED INFUSION: 1.0000 mL/kg/h | INTRAVENOUS | Status: DC

## 2019-06-10 MED ORDER — FENTANYL CITRATE (PF) 100 MCG/2ML IJ SOLN
INTRAMUSCULAR | Status: DC | PRN
  Administered 2019-06-10 (×2): 25 ug via INTRAVENOUS

## 2019-06-10 MED ORDER — ACETAMINOPHEN 325 MG PO TABS: 650.0000 mg | ORAL_TABLET | ORAL | Status: DC | PRN

## 2019-06-10 MED ORDER — SODIUM CHLORIDE 0.9 % IV SOLN: INTRAVENOUS | Status: DC

## 2019-06-10 MED ORDER — HEPARIN SODIUM (PORCINE) 1000 UNIT/ML IJ SOLN
INTRAMUSCULAR | Status: DC | PRN
  Administered 2019-06-10: 4000 [IU] via INTRAVENOUS

## 2019-06-10 MED ORDER — DIPHENHYDRAMINE HCL 25 MG PO CAPS
ORAL_CAPSULE | ORAL | Status: AC
  Administered 2019-06-10: 25 mg via ORAL
  Filled 2019-06-10: qty 1

## 2019-06-10 MED ORDER — ONDANSETRON HCL 4 MG/2ML IJ SOLN: 4.0000 mg | Freq: Four times a day (QID) | INTRAMUSCULAR | Status: DC | PRN

## 2019-06-10 SURGICAL SUPPLY — 8 items
CATH 5F 110X4 TIG (CATHETERS) ×2 IMPLANT
CATH INFINITI 5FR ANG PIGTAIL (CATHETERS) ×2 IMPLANT
DEVICE RAD TR BAND REGULAR (VASCULAR PRODUCTS) ×2 IMPLANT
GLIDESHEATH SLEND SS 6F .021 (SHEATH) ×2 IMPLANT
KIT MANI 3VAL PERCEP (MISCELLANEOUS) ×3 IMPLANT
PACK CARDIAC CATH (CUSTOM PROCEDURE TRAY) ×3 IMPLANT
WIRE HITORQ VERSACORE ST 145CM (WIRE) ×2 IMPLANT
WIRE ROSEN-J .035X260CM (WIRE) ×2 IMPLANT

## 2019-06-10 NOTE — Interval H&P Note (Signed)
History and Physical Interval Note:  06/10/2019 1:20 PM  [name]  has presented today for surgery, with the diagnosis of unstable angina.  The various methods of treatment have been discussed with the patient and family. After consideration of risks, benefits and other options for treatment, the patient has consented to  Procedure(s): LEFT HEART CATH AND CORONARY ANGIOGRAPHY (Left) as a surgical intervention.  The patient's history has been reviewed, patient examined, no change in status, stable for surgery.  I have reviewed the patient's chart and labs.  Questions were answered to the patient's satisfaction.    Cath Lab Visit (complete for each Cath Lab visit)  Clinical Evaluation Leading to the Procedure:   ACS: Yes.    Non-ACS:    Anginal Classification: CCS IV  Anti-ischemic medical therapy: Minimal Therapy (1 class of medications)  Non-Invasive Test Results: No non-invasive testing performed  Prior CABG: No previous CABG  Juwon Scripter

## 2019-06-10 NOTE — Progress Notes (Signed)
Per Dr. Saunders Revel ok for Pt to resume Eliquis 3/31 as long as right radial site looks ok. Pt provided full d/c instructions including follow up with Dr. Garen Lah 4/15 at 11 am.

## 2019-06-10 NOTE — Patient Instructions (Signed)
Medication Instructions:  - Your physician has recommended you make the following change in your medication:   1) Start lasix (furosemide) 40 mg- take 1 tablet every other day x 7 days (this should be 4 doses total)  *If you need a refill on your cardiac medications before your next appointment, please call your pharmacy*   Lab Work:  Pre procedure lab work: BMP/ [place] entrance at [facility], 1st desk on the right.   Pre procedure COVID swab: (8 am- 1 pm) - [facility] entrance   If you have labs (blood work) drawn today and your tests are completely normal, you will receive your results only by: Marland Kitchen MyChart Message (if you have MyChart) OR . A paper copy in the mail If you have any lab test that is abnormal or we need to change your treatment, we will call you to review the results.   Testing/Procedures: - Your physician has requested that you have a cardiac catheterization. Cardiac catheterization is used to diagnose and/or treat various heart conditions. Doctors may recommend this procedure for a number of different reasons. The most common reason is to evaluate chest pain. Chest pain can be a symptom of coronary artery disease (CAD), and cardiac catheterization can show whether plaque is narrowing or blocking your heart's arteries. This procedure is also used to evaluate the valves, as well as measure the blood flow and oxygen levels in different parts of your heart.    You are scheduled for a Cardiac Catheterization on Tuesday, March 30 with Dr. Harrell Gave End.  1. Please arrive at the [place] entrance at [facility] at  Today around 12:15 (once your COVID swab results you will be able to go into Preprocedural area)  Special note: Every effort is made to have your procedure done on time. Please understand that emergencies sometimes delay scheduled procedures.  2. Diet: Do not eat solid foods after midnight.  The patient may have clear liquids until 5am upon the day of the  procedure.  3. Labs: You will need to have blood drawn on TODAY. You do not need to be fasting. At the [facility].   4. Medication instructions in preparation for your procedure: - you will be given Aspirin 324 mg x 1 dose in clinic today  5. Plan for one night stay--bring personal belongings. 6. Bring a current list of your medications and current insurance cards. 7. You MUST have a responsible person to drive you home. 8. Someone MUST be with you the first 24 hours after you arrive home or your discharge will be delayed. 9. Please wear clothes that are easy to get on and off and wear slip-on shoes.  Thank you for allowing [place] to care for you!   -- [place] [facility]    Follow-Up: At [facility], you and your health needs are our priority.  As part of our continuing mission to provide you with exceptional heart care, we have created designated Provider Care Teams.  These Care Teams include your primary Cardiologist (physician) and Advanced Practice Providers (APPs -  Physician Assistants and Nurse Practitioners) who all work together to provide you with the care you need, when you need it.  We recommend signing up for the patient portal called "MyChart".  Sign up information is provided on this After Visit Summary.  MyChart is used to connect with patients for Virtual Visits (Telemedicine).  Patients are able to view lab/test results, encounter notes, upcoming appointments, etc.  Non-urgent messages can be sent to your provider as well.   To learn more about what you can do with MyChart, go to [email or web address].    Your next appointment:   2 week(s)  The format for your next appointment:   In Person  Provider:    You may see Kate Sable, MD or one of the following Advanced Practice Providers on your designated Care Team:    Murray Hodgkins, NP  Christell Faith, PA-C  Marrianne Mood, PA-C    Other Instructions n/a

## 2019-06-11 ENCOUNTER — Encounter: Payer: Self-pay | Admitting: Cardiology

## 2019-06-23 ENCOUNTER — Other Ambulatory Visit: Payer: Self-pay | Admitting: Family Medicine

## 2019-06-23 DIAGNOSIS — J45909 Unspecified asthma, uncomplicated: Secondary | ICD-10-CM

## 2019-06-23 NOTE — Telephone Encounter (Signed)
Requested Prescriptions  Pending Prescriptions Disp Refills  . montelukast (SINGULAIR) 10 MG tablet [Pharmacy Med Name: MONTELUKAST SOD 10 MG TABLET] 90 tablet 1    Sig: TAKE 1 TABLET BY MOUTH EVERY DAY     Pulmonology:  Leukotriene Inhibitors Passed - 06/23/2019 12:56 PM      Passed - Valid encounter within last 12 months    Recent Outpatient Visits          2 months ago [place], Dionne Bucy, MD   5 months ago Essential hypertension   [facility] [place], Dionne Bucy, MD   11 months ago Lumbar spondylosis   [place] [place], Clearnce Sorrel, [place]   1 year ago Cough   [facility] [place], Dionne Bucy, MD   1 year ago Cystitis with hematuria   [facility] [place], Dionne Bucy, MD      Future Appointments            In 3 days Ag[place]-Etang, Aaron Edelman, MD [facility], [place]   In 4 days Bacigalupo, Dionne Bucy, MD [facility], [place]   In 3 weeks Agbor-Etang, Aaron Edelman, MD [facility], [place]   In 1 month Milinda Pointer, MD [place]

## 2019-06-23 NOTE — Telephone Encounter (Signed)
Requested medication (s) are due for refill today: Yes  Requested medication (s) are on the active medication list: Yes  Last refill:  03/25/19  Future visit scheduled: Yes  Notes to clinic:  See request.    Requested Prescriptions  Pending Prescriptions Disp Refills   ondansetron (ZOFRAN) 4 MG tablet [Pharmacy Med Name: ONDANSETRON HCL 4 MG TABLET] 30 tablet 0    Sig: TAKE 1 TABLET BY MOUTH EVERY 8 HOURS AS NEEDED FOR NAUSEA AND VOMITING      Not Delegated - Gastroenterology: Antiemetics Failed - 06/23/2019 12:57 PM      Failed - This refill cannot be delegated      Passed - Valid encounter within last 6 months    Recent Outpatient Visits           2 months ago [place], Dionne Bucy, MD   5 months ago Essential hypertension   [place], Dionne Bucy, MD   11 months ago Lumbar spondylosis   [place], Clearnce Sorrel, [place]   1 year ago Cough   [facility] [place], Dionne Bucy, MD   1 year ago Cystitis with hematuria   [facility] [place], Dionne Bucy, MD       Future Appointments             In 3 days Agbor-Etang, Aaron Edelman, MD [facility], [place]   In 4 days Bacigalupo, Dionne Bucy, MD [facility], [place]   In 3 weeks Agbor-Etang, Aaron Edelman, MD [facility], [place]   In 1 month Milinda Pointer, MD [place]

## 2019-06-26 ENCOUNTER — Ambulatory Visit (INDEPENDENT_AMBULATORY_CARE_PROVIDER_SITE_OTHER): Payer: Medicare Other | Admitting: Cardiology

## 2019-06-26 ENCOUNTER — Other Ambulatory Visit: Payer: Self-pay

## 2019-06-26 ENCOUNTER — Encounter: Payer: Self-pay | Admitting: Cardiology

## 2019-06-26 VITALS — BP 102/70 | HR 53 | Ht 64.0 in | Wt 152.1 lb

## 2019-06-26 DIAGNOSIS — I480 Paroxysmal atrial fibrillation: Secondary | ICD-10-CM

## 2019-06-26 DIAGNOSIS — I10 Essential (primary) hypertension: Secondary | ICD-10-CM | POA: Diagnosis not present

## 2019-06-26 DIAGNOSIS — R0602 Shortness of breath: Secondary | ICD-10-CM

## 2019-06-26 MED ORDER — FUROSEMIDE 40 MG PO TABS: 40.0000 mg | ORAL_TABLET | ORAL | 0 refills | Status: DC

## 2019-06-26 NOTE — Progress Notes (Signed)
Cardiology Office Note:    Date:  06/26/2019   ID:  [name], DOB [birth year], MRN [id number]  PCP:  Virginia Crews, MD  Cardiologist:  Kate Sable, MD  Electrophysiologist:  None   Referring MD: Virginia Crews, MD   Chief Complaint  Patient presents with  . OTHER    Post cath no complaints today. Meds reviewed verbally with pt.    History of Present Illness:    [name] is a 59 y.o. female with a hx of hypertension, asthma, anxiety, presents for follow-up.  She has history of worsening palpitations for 3 months.  Already echo monitor revealed paroxysmal atrial fibrillation, 3% burden.  Toprol-XL and Eliquis was started.  She still notes occasional palpitations that wake her up from sleep.  Echocardiogram in 2015 showed normal systolic function with EF 60 to 65%.  Repeat echo pending.  She had history of chest discomfort and dyspnea.  Underwent left heart cath which showed no evidence for CAD.  She states having nonspecific shortness of breath especially when exposed to cigarette smoke.  Noted to be volume overloaded after last EP visit, started on Lasix.  She states breathing much better with Lasix usage.  She has history of exposure to secondhand smoke as both parents did smoke for a long time.  Past Medical History:  Diagnosis Date  . Abnormal EKG    HX OF INVERTED T WAVES ON EKG, PALPITATIONS, CHEST PAINS-CARDIAC WORK UP DID NOT SHOW ANY HEART DISEASE  . Acute postoperative pain 01/04/2017  . 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 Anemia    Iron Infusion-8 yrs ago  . Anxiety   . Asthma    INHALERS WITH FLARE -UPS  . Cephalalgia 08/18/2014  . Cervical disc disease 08/18/2014   Needs neck surgery.   . Chronic headaches   . DDD (degenerative disc disease)    CERVICAL AND LUMBAR-CHRONIC PAIN, RT HIP LABRAL TEAR  . Depression    PT STATES A LOT OF STRESS IN HER LIFE  . Dissociative disorder   . Dizziness 04/22/2013  . Duodenal ulcer with hemorrhage and  perforation ([place]) 04/27/2003  . GERD (gastroesophageal reflux disease)   . H/O arthrodesis 08/18/2014  . Headache(784.0)    AND NECK PAIN--STATES RECENT TEST SHOW CERVICAL DEGENERATION  . History of blood transfusion    s/p back surgery  . History of cervical spinal surgery 01/04/2015  . History of kidney stones   . Hypertension   . Inverted T wave   . Narrowing of intervertebral disc space 08/18/2014   Currently on disability.   . Orthostatic hypotension 04/22/2013  . Pain    CHRONIC NECK AND BACK PAIN - LIMITED ROM NECK - S/P FUSIONS CERVICAL AND LUMBAR  . Pneumonia   . PONV (postoperative nausea and vomiting)    PT GIVES HX OF N&V AND FEVER WITH SURGERIES YEARS AGO--BUT NO PROBLEMS WITH MORE RECENT SURGERIES--STATES NOT MALIGNANT HYPERTHERMIA  . Postop Hyponatremia 05/14/2012  . Postoperative anemia due to acute blood loss 05/14/2012  . PTSD (post-traumatic stress disorder)   . Right foot drop   . 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     Past Surgical History:  Procedure Laterality Date  . ABDOMINAL HYSTERECTOMY    . afib  05/2019  . ANTERIOR CERVICAL DECOMP/DISCECTOMY FUSION N/A 10/30/2012   Procedure: ACDF C5-6, EXPLORATION AND HARDWARE REMOVAL C6-7;  Surgeon: Melina Schools, MD;  Location: [place];  Service: Orthopedics;  Laterality: N/A;  . ANTERIOR FUSION CERVICAL SPINE  MAY 2012   AT [facility]  . BACK SURGERY  2009   LUMBAR FUSION WITH RODS   . BREAST BIOPSY Right 2008   benign.- bx/clip  . BREAST EXCISIONAL BIOPSY Left 1998   benign  . BREAST REDUCTION SURGERY Bilateral 06/2016  . CARPAL TUNNEL RELEASE  05-06-12   Right  . CHOLECYSTECTOMY    . COLONOSCOPY WITH PROPOFOL N/A 01/26/2017   Procedure: COLONOSCOPY WITH PROPOFOL;  Surgeon: Lucilla Lame, MD;  Location: [place];  Service: Endoscopy;  Laterality: N/A;  . DIAGNOSTIC LAPAROSCOPIES - MULTIPLE FOR ENDOMETRIOSIS    . ESOPHAGEAL DILATION N/A 01/26/2017   Procedure:  ESOPHAGEAL DILATION;  Surgeon: Lucilla Lame, MD;  Location: [place];  Service: Endoscopy;  Laterality: N/A;  . ESOPHAGEAL MANOMETRY N/A 05/30/2017   Procedure: ESOPHAGEAL MANOMETRY (EM);  Surgeon: Lucilla Lame, MD;  Location: [facility];  Service: Endoscopy;  Laterality: N/A;  . ESOPHAGOGASTRODUODENOSCOPY (EGD) WITH PROPOFOL N/A 01/26/2017   Procedure: ESOPHAGOGASTRODUODENOSCOPY (EGD) WITH PROPOFOL;  Surgeon: Lucilla Lame, MD;  Location: [place];  Service: Endoscopy;  Laterality: N/A;  . HIP ARTHROSCOPY  09/20/2011   Procedure: ARTHROSCOPY HIP;  Surgeon: Gearlean Alf, MD;  Location: [facility];  Service: Orthopedics;  Laterality: Right;  Right Hip Scope with Labral Debridement  . LEFT HEART CATH AND CORONARY ANGIOGRAPHY Left 06/10/2019   Procedure: LEFT HEART CATH AND CORONARY ANGIOGRAPHY;  Surgeon: Nelva Bush, MD;  Location: [place] [facility];  Service: Cardiovascular;  Laterality: Left;  . NASAL SEPTUM SURGERY  MARCH 2013   IN [place]  . [place] IMPEDANCE STUDY N/A 05/30/2017   Procedure: [place] IMPEDANCE STUDY;  Surgeon: Lucilla Lame, MD;  Location: [facility];  Service: Endoscopy;  Laterality: N/A;  . POLYPECTOMY N/A 01/26/2017   Procedure: POLYPECTOMY;  Surgeon: Lucilla Lame, MD;  Location: [place];  Service: Endoscopy;  Laterality: N/A;  . POSTERIOR CERVICAL FUSION/FORAMINOTOMY N/A 04/16/2013   Procedure: REMOVAL CERVICAL PLATES AND INTERBODY CAGE/POSTERIOR CERVICAL SPINAL FUSION C4 - C6/C5 CORPECTOMY/C4 - C6 FUSION WITH ILIAC CREST BONE GRAFT;  Surgeon: Melina Schools, MD;  Location: [place];  Service: Orthopedics;  Laterality: N/A;  . RADIOFREQUENCY ABLATION NERVES    . REDUCTION MAMMAPLASTY Bilateral 05/2016  . RIGHT HIP ARTHROSCOPY FOR LABRAL TEAR  ABOUT 2010   2012 also  . SHOULDER ARTHROSCOPY  05-06-12   bone spur  . TONSILLECTOMY     AS A CHILD  . TOTAL HIP ARTHROPLASTY Right 05/13/2012   Procedure: TOTAL HIP ARTHROPLASTY ANTERIOR APPROACH;   Surgeon: Gearlean Alf, MD;  Location: [facility];  Service: Orthopedics;  Laterality: Right;    Current Medications: Current Meds  Medication Sig  . acetaminophen (TYLENOL) 500 MG tablet Take 500 mg by mouth every 6 (six) hours as needed.  Marland Kitchen albuterol (PROVENTIL HFA) 108 (90 Base) MCG/ACT inhaler Inhale 1-2 puffs into the lungs every 4 (four) hours as needed.   Marland Kitchen apixaban (ELIQUIS) 5 MG TABS tablet Take 1 tablet (5 mg total) by mouth 2 (two) times daily.  . calcium carbonate (TUMS - DOSED IN MG ELEMENTAL CALCIUM) 500 MG chewable tablet Chew 1 tablet by mouth 3 (three) times daily with meals. As needed  . Docusate Sodium (COLACE PO) Take by mouth daily.   . Fluticasone-Salmeterol (ADVAIR) 500-50 MCG/DOSE AEPB Inhale 1 puff into the lungs 2 (two) times daily. As needed  . furosemide (LASIX) 40 MG tablet Take 1 tablet (40 mg total) by mouth  every other day. Take 1 tablet (40 mg) by mouth every other day x 7 days (total of 4 doses)  . metoprolol succinate (TOPROL-XL) 50 MG 24 hr tablet TAKE 1 TABLET BY MOUTH EVERY DAY  . montelukast (SINGULAIR) 10 MG tablet TAKE 1 TABLET BY MOUTH EVERY DAY  . omeprazole (PRILOSEC) 40 MG capsule Take 40 mg by mouth in the morning and at bedtime.  . ondansetron (ZOFRAN) 4 MG tablet TAKE 1 TABLET BY MOUTH EVERY 8 HOURS AS NEEDED FOR NAUSEA AND VOMITING  . Oxycodone HCl 10 MG TABS Take 1 tablet (10 mg total) by mouth every 6 (six) hours as needed. Must last 30 days  . Oxycodone HCl 10 MG TABS Take 1 tablet (10 mg total) by mouth every 6 (six) hours as needed. Must last 30 days  . [START ON 07/25/2019] Oxycodone HCl 10 MG TABS Take 1 tablet (10 mg total) by mouth every 6 (six) hours as needed. Must last 30 days  . pantoprazole (PROTONIX) 40 MG tablet Take 1 tablet (40 mg total) by mouth 2 (two) times daily.  Marland Kitchen Spacer/Aero-Holding Chambers (AEROCHAMBER MV) inhaler by Does not apply route.  . valACYclovir (VALTREX) 1000 MG tablet TAKE TWO TABLETS BY MOUTH TWICE DAILY FOR  ONE DAY FEVER BLISTER  . [DISCONTINUED] furosemide (LASIX) 40 MG tablet Take 1 tablet (40 mg) by mouth every other day x 7 days (total of 4 doses)     Allergies:   Amoxicillin, Chlorhexidine gluconate, Clindamycin, Codeine, Erythromycin, Penicillin g, Sulfa antibiotics, Levofloxacin, Shellfish allergy, Decadron [dexamethasone], Mangifera indica, Papaya derivatives, Betadine [povidone iodine], Chlorhexidine, Clarithromycin, Clindamycin/lincomycin, Other, Povidone-iodine, and Prednisone   Social History   Socioeconomic History  . Marital status: Married    Spouse name: bruce  . Number of children: 2  . Years of education: Not on file  . Highest education level: Associate degree: occupational, Hotel manager, or vocational program  Occupational History    Comment: disabled  Tobacco Use  . Smoking status: Never Smoker  . Smokeless tobacco: Never Used  Substance and Sexual Activity  . Alcohol use: No    Alcohol/week: 0.0 standard drinks  . Drug use: No  . Sexual activity: Yes  Other Topics Concern  . Not on file  Social History Narrative   Son, daughter  And 4 grandkids; raises 3 of the grandchildren   Social Determinants of Health   Financial Resource Strain:   . Difficulty of Paying Living Expenses:   Food Insecurity: Food Insecurity Present  . Worried About Charity fundraiser in the Last Year: Sometimes true  . Ran Out of Food in the Last Year: Not on file  Transportation Needs:   . Lack of Transportation (Medical):   Marland Kitchen Lack of Transportation (Non-Medical):   Physical Activity: Insufficiently Active  . Days of Exercise per Week: 3 days  . Minutes of Exercise per Session: 30 min  Stress:   . Feeling of Stress :   Social Connections:   . Frequency of Communication with Friends and Family:   . Frequency of Social Gatherings with Friends and Family:   . Attends Religious Services:   . Active Member of Clubs or Organizations:   . Attends Archivist Meetings:   Marland Kitchen  Marital Status:      Family History: The patient's family history includes Aneurysm in her maternal grandmother and mother; Anxiety disorder in her grandchild; Bipolar disorder in her grandchild and sister; Breast cancer (age of onset: 8) in her paternal grandmother; Depression  in her grandchild.  ROS:   Please see the history of present illness.     All other systems reviewed and are negative.  EKGs/Labs/Other Studies Reviewed:    The following studies were reviewed today:   EKG:  EKG is  ordered today.  The ekg ordered today demonstrates sinus bradycardia, heart rate 53.  Recent Labs: 11/11/2018: Magnesium 1.9 06/10/2019: BUN 13; Creatinine, Ser 0.87; Hemoglobin 12.5; Platelets 358; Potassium 4.6; Sodium 138  Recent Lipid Panel    Component Value Date/Time   CHOL 217 (H) 12/25/2017 1222   TRIG 131 12/25/2017 1222   HDL 68 12/25/2017 1222   CHOLHDL 3.2 12/25/2017 1222   LDLCALC 123 (H) 12/25/2017 1222    Physical Exam:    VS:  BP 102/70 (BP Location: Left Arm, Patient Position: Sitting, Cuff Size: Normal)   Pulse (!) 53   Ht 5\' 4"  (1.626 m)   Wt 152 lb 2 oz (69 kg)   SpO2 98%   BMI 26.11 kg/m     Wt Readings from Last 3 Encounters:  06/26/19 152 lb 2 oz (69 kg)  06/10/19 154 lb (69.9 kg)  06/10/19 154 lb 8 oz (70.1 kg)     GEN:  Well nourished, well developed in no acute distress HEENT: Normal NECK: No JVD; No carotid bruits LYMPHATICS: No lymphadenopathy CARDIAC: RRR, no murmurs, rubs, gallops RESPIRATORY:  Clear to auscultation without rales, wheezing or rhonchi  ABDOMEN: Soft, non-tender, non-distended MUSCULOSKELETAL:  No edema; No deformity  SKIN: Warm and dry NEUROLOGIC:  Alert and oriented x 3 PSYCHIATRIC:  Normal affect   ASSESSMENT:    1. Paroxysmal A-fib ([place])   2. Essential hypertension   3. SOB (shortness of breath)    PLAN:    In order of problems listed above:  1. Patient with worsening symptoms of palpitations.  Cardiac monitor showed  paroxysmal atrial fibrillation, about 3% burden.  CHA2DS2-VASc score 2.  Continue Toprol-XL, start Eliquis 5 mg twice daily.  Keep follow-up appointment with EP regarding possible ablation.  Left heart cath did not show any evidence for CAD.  Last echo 2015 showed normal ejection fraction, repeat echocardiogram pending. 2. History of hypertension, blood pressure appears controlled.  Continue Toprol-XL as prescribed. 3. Nonspecific shortness of breath.  Will review echocardiogram as ordered.  If echocardiogram is normal, will consider referral to pulmonary for pulmonary pathology in light of long exposure to secondhand smoking.   Follow-up after echocardiogram.  This note was generated in part or whole with voice recognition software. Voice recognition is usually quite accurate but there are transcription errors that can and very often do occur. I apologize for any typographical errors that were not detected and corrected.  Medication Adjustments/Labs and Tests Ordered: Current medicines are reviewed at length with the patient today.  Concerns regarding medicines are outlined above.  Orders Placed This Encounter  Procedures  . EKG 12-Lead   Meds ordered this encounter  Medications  . furosemide (LASIX) 40 MG tablet    Sig: Take 1 tablet (40 mg total) by mouth every other day. Take 1 tablet (40 mg) by mouth every other day x 7 days (total of 4 doses)    Dispense:  45 tablet    Refill:  0    Patient Instructions  Medication Instructions:  No changes  *If you need a refill on your cardiac medications before your next appointment, please call your pharmacy*   Lab Work: None  If you have labs (blood work) drawn today  and your tests are completely normal, you will receive your results only by: Marland Kitchen MyChart Message (if you have MyChart) OR . A paper copy in the mail If you have any lab test that is abnormal or we need to change your treatment, we will call you to review the  results.   Testing/Procedures: None   Follow-Up: At [facility], you and your health needs are our priority.  As part of our continuing mission to provide you with exceptional heart care, we have created designated Provider Care Teams.  These Care Teams include your primary Cardiologist (physician) and Advanced Practice Providers (APPs -  Physician Assistants and Nurse Practitioners) who all work together to provide you with the care you need, when you need it.  Your next appointment:  Keep your scheduled follow up after echocardiogram with Dr. Garen Lah.  Schedule appointment with Dr. Caryl Comes to discuss questions about procedure.     Signed, Kate Sable, MD  06/26/2019 11:36 AM    [place]

## 2019-06-26 NOTE — Progress Notes (Signed)
I,Laura E Walsh,acting as a scribe for Lavon Paganini, MD.,have documented all relevant documentation on the behalf of Lavon Paganini, MD,as directed by  Lavon Paganini, MD while in the presence of Lavon Paganini, MD.     Established patient visit    Patient: [name]   DOB: 1960/04/07   59 y.o. Female  MRN: [id number] Visit Date: 06/27/2019  Today's healthcare provider: Lavon Paganini, MD  Subjective:    Chief Complaint  Patient presents with  . Insomnia  . Constipation  . Hypertension   Insomnia Primary symptoms: sleep disturbance, difficulty falling asleep, frequent awakening.  The problem has been gradually worsening since onset. How many beverages per day that contain caffeine: 4-5.  Types of beverages you drink: tea (Half Caffeine tea). Past treatments include medication and meditation. The treatment provided no relief. Typical bedtime:  8-10 P.M..  How long after going to bed to you fall asleep: over an hour.   PMH includes: no apnea.  Constipation This is a chronic problem. The problem has been gradually worsening since onset. Her stool frequency is 1 time per week or less. The patient is on a high fiber diet. She exercises regularly. There has not been adequate water intake. Associated symptoms include abdominal pain, back pain, bloating, flatus and nausea. Pertinent negatives include no anorexia, diarrhea, fecal incontinence, hemorrhoids, rectal pain, vomiting or weight loss. The treatment provided no relief.   Hypertension, follow-up  BP Readings from Last 3 Encounters:  06/27/19 103/72  06/26/19 102/70  06/10/19 117/66   Wt Readings from Last 3 Encounters:  06/27/19 155 lb (70.3 kg)  06/26/19 152 lb 2 oz (69 kg)  06/10/19 154 lb (69.9 kg)   She was last seen for hypertension 6 months ago.  BP at that visit was 108/74. Management since that visit includes No changes. She reports excellent compliance with treatment. She is not having side  effects.  She is exercising. She is adherent to low salt diet.   Outside blood pressures are . Symptoms: Yes chest pain Yes chest pressure/discomfort Yes dyspnea (difficulty breathing) No lower extremity edema No orthopnea Yes palpitations No paroxysmal nocturnal dyspnea  No syncope  She does not smoke. She is following a Regular diet. Use of agents associated with hypertension: none.   Last basic metabolic panel Lab Results  Component Value Date   GLUCOSE 111 (H) 06/10/2019   NA 138 06/10/2019   K 4.6 06/10/2019   CL 104 06/10/2019   CO2 24 06/10/2019   BUN 13 06/10/2019   CREATININE 0.87 06/10/2019   GFRNONAA >60 06/10/2019   GFRAA >60 06/10/2019   CALCIUM 9.7 06/10/2019   Last lipids Lab Results  Component Value Date   CHOL 217 (H) 12/25/2017   HDL 68 12/25/2017   LDLCALC 123 (H) 12/25/2017   TRIG 131 12/25/2017   CHOLHDL 3.2 12/25/2017    The 10-year ASCVD risk score Mikey Bussing DC Jr., et al., 2013) is: 2.7%   -----------------------------------------------------------------------------------------   -Patient [facility] on 05[facility]19 for heart palpitations. See notes. -Patient was also admitted to the Hospital for Shortness of Breath on 06/10/19, see notes.   Patient Active Problem List   Diagnosis Date Noted  . Pure hypercholesterolemia 06/27/2019  . Constipation due to opioid therapy 06/27/2019  . Snoring 06/27/2019  . Unstable angina ([place]) 06/10/2019  . Chronic musculoskeletal pain 01/14/2019  . Hypertension 12/27/2018  . Spondylosis without myelopathy or radiculopathy, lumbosacral region 11/12/2018  . History of  allergy to radiographic contrast media 11/12/2018  . History of allergy to shellfish 11/12/2018  . DDD (degenerative disc disease), lumbosacral 07/31/2018  . Neuropathic pain 07/31/2018  . Neurogenic pain 07/31/2018  . Vitamin D deficiency 05/28/2018  . History of lumbar surgery 05/28/2018  . Groin pain, chronic,  left 05/20/2018  . Other intervertebral disc degeneration, lumbar region 05/20/2018  . Disorder of skeletal system 05/20/2018  . Problems influencing health status 05/20/2018  . Overweight 12/25/2017  . Enthesopathy of hip region (Left) 07/13/2017  . Trigger point of shoulder region (Left) 02/26/2017  . Pharmacologic therapy   . Polyp of sigmoid colon   . Problems with swallowing and mastication   . Chronic shoulder arthropathy (Left) 01/04/2017  . Dysphagia 12/07/2016  . Abnormal flushing and sweating 12/07/2016  . Long term prescription benzodiazepine use 10/11/2016  . 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 Chronic hip pain (Secondary Area of Pain) (Bilateral) (L>R) 10/11/2016  . 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 Cervicogenic headache 07/06/2015  . History of total hip replacement (THR) (Right) 07/05/2015  . Chronic shoulder radicular pain (Bilateral) (L>R) 07/05/2015  . Lumbar facet syndrome (Bilateral) (L>R) 07/05/2015  . Chronic low back pain (Primary area of Pain) (Bilateral) (L>R) w/o sciatica 04/05/2015  . Chronic hip pain (Right) 01/04/2015  . Encounter for therapeutic drug level monitoring 01/04/2015  . Long term current use of opiate analgesic 01/04/2015  . Long term prescription opiate use 01/04/2015  . Opiate use 01/04/2015  . Chronic pain syndrome 01/04/2015  . Steroid intolerance 01/04/2015  . Cervical spondylosis 01/04/2015  . Cervical facet arthropathy (Bilateral) 01/04/2015  . History of cervical spinal surgery 01/04/2015  . Chronic shoulder pain [facility] Area of Pain)  (Bilateral) (L>R) 01/04/2015  . 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 Lumbar spondylosis 01/04/2015  . Cervical facet syndrome (Bilateral) 01/04/2015  . Chronic sacroiliac joint pain (Left) 01/04/2015  . DOE (dyspnea on exertion) 09/03/2014  . Asthma, mild 08/18/2014  .  Blurred vision 08/18/2014  . Benign paroxysmal positional nystagmus 08/18/2014  . Clinical depression 08/18/2014  . Fatigue 08/18/2014  . Cannot sleep 08/18/2014  . Awareness of heartbeats 08/18/2014  . RAD (reactive airway disease) 08/18/2014  . Apnea, sleep 08/18/2014  . Chronic neck pain 04/16/2013  . Anxiety state 08/03/2003  . Colon, diverticulosis 07/15/2003  . IBS (irritable bowel syndrome) 04/27/2003  . Esophagitis, reflux 02/11/2003  . 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 Smoking status: Never Smoker  . Smokeless tobacco: Never Used  Substance Use Topics  . Alcohol use: No    Alcohol/week: 0.0 standard drinks  . Drug use: No   Allergies  Allergen Reactions  . Amoxicillin Hives  . Chlorhexidine Gluconate Dermatitis and Hives  . Clindamycin Hives  . Codeine Hives  . Erythromycin Hives    "mycins" in general  . Penicillin G Hives    "cillins" in general  . Sulfa Antibiotics Nausea And Vomiting and Hives       . Levofloxacin Hives  . Shellfish Allergy Hives  . Decadron [Dexamethasone] Other (See Comments)    Hot flashes, insomnia, "manic" Hot flashes, insomnia, "manic"  . Mangifera Indica Hives    papaya  . Papaya Derivatives Hives  . Betadine [Povidone Iodine] Hives       . Chlorhexidine Hives       . Clarithromycin Hives  .  Clindamycin/Lincomycin Hives  . Other Hives     Mango   . Povidone-Iodine Hives  . Prednisone Anxiety    High blood pressure, flushed, mood changes, heart palpitations High blood pressure, flushed, mood changes, heart palpitations       Medications: Outpatient Medications Prior to Visit  Medication Sig  . acetaminophen (TYLENOL) 500 MG tablet Take 500 mg by mouth every 6 (six) hours as needed.  Marland Kitchen albuterol (PROVENTIL HFA) 108 (90 Base) MCG/ACT inhaler Inhale 1-2 puffs into the lungs every 4 (four) hours as needed.   Marland Kitchen apixaban (ELIQUIS) 5 MG TABS tablet Take 1 tablet (5 mg  total) by mouth 2 (two) times daily.  . calcium carbonate (TUMS - DOSED IN MG ELEMENTAL CALCIUM) 500 MG chewable tablet Chew 1 tablet by mouth 3 (three) times daily with meals. As needed  . Docusate Sodium (COLACE PO) Take by mouth daily.   . Fluticasone-Salmeterol (ADVAIR) 500-50 MCG/DOSE AEPB Inhale 1 puff into the lungs 2 (two) times daily. As needed  . furosemide (LASIX) 40 MG tablet Take 1 tablet (40 mg total) by mouth every other day. Take 1 tablet (40 mg) by mouth every other day x 7 days (total of 4 doses)  . metoprolol succinate (TOPROL-XL) 50 MG 24 hr tablet TAKE 1 TABLET BY MOUTH EVERY DAY  . montelukast (SINGULAIR) 10 MG tablet TAKE 1 TABLET BY MOUTH EVERY DAY  . omeprazole (PRILOSEC) 40 MG capsule Take 40 mg by mouth in the morning and at bedtime.  . ondansetron (ZOFRAN) 4 MG tablet TAKE 1 TABLET BY MOUTH EVERY 8 HOURS AS NEEDED FOR NAUSEA AND VOMITING  . Oxycodone HCl 10 MG TABS Take 1 tablet (10 mg total) by mouth every 6 (six) hours as needed. Must last 30 days  . [START ON 07/25/2019] Oxycodone HCl 10 MG TABS Take 1 tablet (10 mg total) by mouth every 6 (six) hours as needed. Must last 30 days  . pantoprazole (PROTONIX) 40 MG tablet Take 1 tablet (40 mg total) by mouth 2 (two) times daily.  Marland Kitchen Spacer/Aero-Holding Chambers (AEROCHAMBER MV) inhaler by Does not apply route.  . valACYclovir (VALTREX) 1000 MG tablet TAKE TWO TABLETS BY MOUTH TWICE DAILY FOR ONE DAY FEVER BLISTER  . Oxycodone HCl 10 MG TABS Take 1 tablet (10 mg total) by mouth every 6 (six) hours as needed. Must last 30 days   No facility-administered medications prior to visit.    Review of Systems  Constitutional: Negative.  Negative for weight loss.  Respiratory: Positive for chest tightness and shortness of breath. Negative for apnea, cough, choking, wheezing and stridor.   Cardiovascular: Positive for chest pain and palpitations. Negative for leg swelling.  Gastrointestinal: Positive for abdominal distention,  abdominal pain, bloating, constipation, flatus and nausea. Negative for anal bleeding, anorexia, blood in stool, diarrhea, hemorrhoids, rectal pain and vomiting.  Musculoskeletal: Positive for back pain.  Neurological: Positive for light-headedness. Negative for dizziness and headaches.  Psychiatric/Behavioral: Positive for decreased concentration and sleep disturbance. Negative for behavioral problems, dysphoric mood, self-injury and suicidal ideas. The patient is nervous/anxious and has insomnia.     Last CBC Lab Results  Component Value Date   WBC 6.7 06/10/2019   HGB 12.5 06/10/2019   HCT 39.4 06/10/2019   MCV 83.5 06/10/2019   MCH 26.5 06/10/2019   RDW 13.3 06/10/2019   PLT 358 [id number]   Last metabolic panel Lab Results  Component Value Date   GLUCOSE 111 (H) 06/10/2019   NA 138 06/10/2019  K 4.6 06/10/2019   CL 104 06/10/2019   CO2 24 06/10/2019   BUN 13 06/10/2019   CREATININE 0.87 06/10/2019   GFRNONAA >60 06/10/2019   GFRAA >60 06/10/2019   CALCIUM 9.7 06/10/2019   PROT 6.7 11/11/2018   ALBUMIN 4.0 11/11/2018   LABGLOB 2.7 11/11/2018   AGRATIO 1.5 11/11/2018   BILITOT 0.4 11/11/2018   ALKPHOS 134 (H) 11/11/2018   AST 28 11/11/2018   ALT 9 12/25/2017   ANIONGAP 10 06/10/2019   Last lipids Lab Results  Component Value Date   CHOL 217 (H) 12/25/2017   HDL 68 12/25/2017   LDLCALC 123 (H) 12/25/2017   TRIG 131 12/25/2017   CHOLHDL 3.2 12/25/2017        Objective:    BP 103/72 (BP Location: Left Arm, Patient Position: Sitting, Cuff Size: Normal)   Pulse (!) 55   Temp (!) 96.9 F (36.1 C) (Temporal)   Wt 155 lb (70.3 kg)   BMI 26.61 kg/m  BP Readings from Last 3 Encounters:  06/27/19 103/72  06/26/19 102/70  06/10/19 117/66      Physical Exam Vitals and nursing note reviewed.  Constitutional:      General: She is not in acute distress.    Appearance: Normal appearance. She is not diaphoretic.  HENT:     Head: Normocephalic and atraumatic.   Eyes:     General: No scleral icterus.    Conjunctiva/sclera: Conjunctivae normal.  Cardiovascular:     Rate and Rhythm: Normal rate. Rhythm irregularly irregular.     Pulses: Normal pulses.     Heart sounds: Normal heart sounds. No murmur.  Pulmonary:     Effort: Pulmonary effort is normal. No respiratory distress.     Breath sounds: Normal breath sounds. No wheezing.  Abdominal:     General: There is no distension.     Palpations: Abdomen is soft.     Tenderness: There is no abdominal tenderness. There is no guarding or rebound.  Musculoskeletal:     Cervical back: Neck supple.  Lymphadenopathy:     Cervical: No cervical adenopathy.  Skin:    General: Skin is warm and dry.     Findings: No rash.  Neurological:     Mental Status: She is alert and oriented to person, place, and time. Mental status is at baseline.  Psychiatric:        Mood and Affect: Mood normal.        Behavior: Behavior normal.        Thought Content: Thought content normal.        Judgment: Judgment normal.       No results found for any visits on 06/27/19.    Assessment & Plan:    Problem List Items Addressed This Visit      Cardiovascular and Mediastinum   Hypertension - Primary    Well controlled Continue current medications Reviewed recent metabolic panel F/u in 6 months         Digestive   Constipation due to opioid therapy    New problem, though ongoing for several months Uncontrolled, pt reports sometimes going two weeks without a bowel movement.  Pt has tried OTC Laxatives, stool softeners, enemas with no relief  Discussed the nature of opioid-induced constipation RX for Amatiza 24mg  BID Discussed that Amitiza may need prior authorization, so she can try senna in the meantime Reviewed last, reviewed last colonoscopy      Relevant Medications   AMITIZA 24 MCG capsule  Other   Long term current use of opiate analgesic (Chronic)   Cannot sleep    Chronic issue,  uncontrolled. Pt reports snoring at night, not feeling rested in the AM. [place] completed in the office.  Referral for sleep study.        Relevant Orders   PSG Sleep Study   Pure hypercholesterolemia    Reviewed last lipid panel Not currently on a statin Recheck FLP and CMP Discussed diet and exercise      Relevant Orders   Lipid panel   Hepatic function panel   Snoring    See plan is for insomnia Sleep study to rule out OSA      Relevant Orders   PSG Sleep Study       Return in about 6 months (around 12/27/2019) for CPE/AWV.     I, Lavon Paganini, MD, have reviewed all documentation for this visit. The documentation on 06/27/19 for the exam, diagnosis, procedures, and orders are all accurate and complete.   Noelle Hoogland, Dionne Bucy, MD, MPH [facility]

## 2019-06-26 NOTE — Patient Instructions (Addendum)
Medication Instructions:  No changes  *If you need a refill on your cardiac medications before your next appointment, please call your pharmacy*   Lab Work: None  If you have labs (blood work) drawn today and your tests are completely normal, you will receive your results only by: Marland Kitchen MyChart Message (if you have MyChart) OR . A paper copy in the mail If you have any lab test that is abnormal or we need to change your treatment, we will call you to review the results.   Testing/Procedures: None   Follow-Up: At [facility], you and your health needs are our priority.  As part of our continuing mission to provide you with exceptional heart care, we have created designated Provider Care Teams.  These Care Teams include your primary Cardiologist (physician) and Advanced Practice Providers (APPs -  Physician Assistants and Nurse Practitioners) who all work together to provide you with the care you need, when you need it.  Your next appointment:  Keep your scheduled follow up after echocardiogram with Dr. Garen Lah.  Schedule appointment with Dr. Caryl Comes to discuss questions about procedure.

## 2019-06-27 ENCOUNTER — Ambulatory Visit (INDEPENDENT_AMBULATORY_CARE_PROVIDER_SITE_OTHER): Payer: Medicare Other | Admitting: Family Medicine

## 2019-06-27 ENCOUNTER — Encounter: Payer: Self-pay | Admitting: Family Medicine

## 2019-06-27 VITALS — BP 103/72 | HR 55 | Temp 96.9°F | Wt 155.0 lb

## 2019-06-27 DIAGNOSIS — R0683 Snoring: Secondary | ICD-10-CM

## 2019-06-27 DIAGNOSIS — E7800 Pure hypercholesterolemia, unspecified: Secondary | ICD-10-CM | POA: Insufficient documentation

## 2019-06-27 DIAGNOSIS — G4700 Insomnia, unspecified: Secondary | ICD-10-CM | POA: Diagnosis not present

## 2019-06-27 DIAGNOSIS — Z79891 Long term (current) use of opiate analgesic: Secondary | ICD-10-CM

## 2019-06-27 DIAGNOSIS — K5903 Drug induced constipation: Secondary | ICD-10-CM | POA: Insufficient documentation

## 2019-06-27 DIAGNOSIS — I10 Essential (primary) hypertension: Secondary | ICD-10-CM | POA: Diagnosis not present

## 2019-06-27 DIAGNOSIS — T402X5A Adverse effect of other opioids, initial encounter: Secondary | ICD-10-CM | POA: Insufficient documentation

## 2019-06-27 MED ORDER — AMITIZA 24 MCG PO CAPS: 24.0000 ug | ORAL_CAPSULE | Freq: Two times a day (BID) | ORAL | 5 refills | Status: DC

## 2019-06-27 NOTE — Assessment & Plan Note (Addendum)
Well controlled Continue current medications Reviewed recent metabolic panel F/u in 6 months  

## 2019-06-27 NOTE — Assessment & Plan Note (Signed)
See plan is for insomnia Sleep study to rule out OSA

## 2019-06-27 NOTE — Assessment & Plan Note (Signed)
Reviewed last lipid panel Not currently on a statin Recheck FLP and CMP Discussed diet and exercise  

## 2019-06-27 NOTE — Assessment & Plan Note (Signed)
Chronic issue, uncontrolled. Pt reports snoring at night, not feeling rested in the AM. [place] completed in the office.  Referral for sleep study.

## 2019-06-27 NOTE — Assessment & Plan Note (Addendum)
New problem, though ongoing for several months Uncontrolled, pt reports sometimes going two weeks without a bowel movement.  Pt has tried OTC Laxatives, stool softeners, enemas with no relief  Discussed the nature of opioid-induced constipation RX for Amatiza 24mg  BID Discussed that Amitiza may need prior authorization, so she can try senna in the meantime Reviewed last, reviewed last colonoscopy

## 2019-06-28 LAB — HEPATIC FUNCTION PANEL
ALT: 11 IU/L (ref 0–32)
AST: 22 IU/L (ref 0–40)
Albumin: 4.6 g/dL (ref 3.8–4.9)
Alkaline Phosphatase: 148 IU/L — ABNORMAL HIGH (ref 39–117)
Bilirubin Total: 0.4 mg/dL (ref 0.0–1.2)
Bilirubin, Direct: 0.12 mg/dL (ref 0.00–0.40)
Total Protein: 7.3 g/dL (ref 6.0–8.5)

## 2019-06-28 LAB — LIPID PANEL
Chol/HDL Ratio: 3.7 ratio (ref 0.0–4.4)
Cholesterol, Total: 220 mg/dL — ABNORMAL HIGH (ref 100–199)
HDL: 60 mg/dL (ref >39)
LDL Chol Calc (NIH): 124 mg/dL — ABNORMAL HIGH (ref 0–99)
Triglycerides: 205 mg/dL — ABNORMAL HIGH (ref 0–149)
VLDL Cholesterol Cal: 36 mg/dL (ref 5–40)

## 2019-07-01 ENCOUNTER — Ambulatory Visit: Payer: Medicare Other

## 2019-07-03 ENCOUNTER — Telehealth: Payer: Self-pay

## 2019-07-03 NOTE — Telephone Encounter (Signed)
Copied from [place] [id number]. Topic: General - Other >> Jul 03, 2019 12:42 PM Mcneil, Ja-Kwan wrote: Reason for CRM: Pt stated she has not heard from anyone regarding the sleep study. Pt request that muscle relaxer be sent to [id number] [place] due to sciatica. Pt stated the pain radiates from back to hip area

## 2019-07-04 ENCOUNTER — Other Ambulatory Visit: Payer: Self-pay

## 2019-07-04 ENCOUNTER — Ambulatory Visit: Payer: Medicare Other | Attending: Internal Medicine

## 2019-07-04 DIAGNOSIS — Z79899 Other long term (current) drug therapy: Secondary | ICD-10-CM | POA: Diagnosis not present

## 2019-07-04 DIAGNOSIS — Z23 Encounter for immunization: Secondary | ICD-10-CM

## 2019-07-04 MED ORDER — CYCLOBENZAPRINE HCL 5 MG PO TABS: 5.0000 mg | ORAL_TABLET | Freq: Three times a day (TID) | ORAL | 0 refills | Status: DC | PRN

## 2019-07-04 NOTE — Telephone Encounter (Signed)
I think it is okay for [place] to send in a muscle relaxer even though she is followed by pain management.  We can E prescribe Flexeril 5 mg 3 times daily as needed, #30 with 0 refills to the requested pharmacy.  Thank you

## 2019-07-04 NOTE — Progress Notes (Signed)
   Covid-19 Vaccination Clinic  Name:  [name]    MRN: [id number] DOB: [birth year]  07/04/2019  [name] was observed post Covid-19 immunization for 15 minutes without incident. She was provided with Vaccine Information Sheet and instruction to access the V-Safe system.   [name] was instructed to call 911 with any severe reactions post vaccine: Marland Kitchen Difficulty breathing  . Swelling of face and throat  . A fast heartbeat  . A bad rash all over body  . Dizziness and weakness   Immunizations Administered    Name Date Dose VIS Date Route   Pfizer COVID-19 Vaccine 07/04/2019 10:19 AM 0.3 mL 05/07/2018 Intramuscular   Manufacturer: [employer], [employer]   Lot: [id number]   [place]: [id number]

## 2019-07-04 NOTE — Telephone Encounter (Signed)
Please advise regarding muscle relaxer, pt followed by pain clinic. I have sent a message to Parke Poisson in regards to sleep study.

## 2019-07-04 NOTE — Telephone Encounter (Signed)
Left detailed message.   

## 2019-07-04 NOTE — Addendum Note (Signed)
Addended by: Shawna Orleans on: 07/04/2019 10:09 AM   Modules accepted: Orders

## 2019-07-09 ENCOUNTER — Other Ambulatory Visit: Payer: Self-pay

## 2019-07-09 ENCOUNTER — Ambulatory Visit (INDEPENDENT_AMBULATORY_CARE_PROVIDER_SITE_OTHER): Payer: Medicare Other

## 2019-07-09 DIAGNOSIS — I480 Paroxysmal atrial fibrillation: Secondary | ICD-10-CM

## 2019-07-10 ENCOUNTER — Emergency Department
Admission: EM | Admit: 2019-07-10 | Discharge: 2019-07-10 | Disposition: A | Discharge status: Home / Self Care | Payer: Medicare Other | Attending: Emergency Medicine | Admitting: Emergency Medicine

## 2019-07-10 ENCOUNTER — Emergency Department: Payer: Medicare Other

## 2019-07-10 ENCOUNTER — Other Ambulatory Visit: Payer: Self-pay | Admitting: Family Medicine

## 2019-07-10 ENCOUNTER — Other Ambulatory Visit: Payer: Self-pay

## 2019-07-10 DIAGNOSIS — R110 Nausea: Secondary | ICD-10-CM | POA: Diagnosis not present

## 2019-07-10 DIAGNOSIS — R5383 Other fatigue: Secondary | ICD-10-CM | POA: Diagnosis not present

## 2019-07-10 DIAGNOSIS — J45909 Unspecified asthma, uncomplicated: Secondary | ICD-10-CM | POA: Diagnosis not present

## 2019-07-10 DIAGNOSIS — Z96641 Presence of right artificial hip joint: Secondary | ICD-10-CM | POA: Insufficient documentation

## 2019-07-10 DIAGNOSIS — Z79899 Other long term (current) drug therapy: Secondary | ICD-10-CM | POA: Diagnosis not present

## 2019-07-10 DIAGNOSIS — Z7901 Long term (current) use of anticoagulants: Secondary | ICD-10-CM | POA: Diagnosis not present

## 2019-07-10 DIAGNOSIS — M25551 Pain in right hip: Secondary | ICD-10-CM | POA: Diagnosis not present

## 2019-07-10 DIAGNOSIS — R0602 Shortness of breath: Secondary | ICD-10-CM | POA: Diagnosis not present

## 2019-07-10 DIAGNOSIS — R42 Dizziness and giddiness: Secondary | ICD-10-CM | POA: Insufficient documentation

## 2019-07-10 DIAGNOSIS — I10 Essential (primary) hypertension: Secondary | ICD-10-CM | POA: Diagnosis not present

## 2019-07-10 DIAGNOSIS — M545 Low back pain: Secondary | ICD-10-CM | POA: Insufficient documentation

## 2019-07-10 DIAGNOSIS — R61 Generalized hyperhidrosis: Secondary | ICD-10-CM | POA: Insufficient documentation

## 2019-07-10 DIAGNOSIS — G8929 Other chronic pain: Secondary | ICD-10-CM | POA: Insufficient documentation

## 2019-07-10 DIAGNOSIS — R55 Syncope and collapse: Secondary | ICD-10-CM | POA: Insufficient documentation

## 2019-07-10 LAB — T4, FREE: Free T4: 1.24 ng/dL — ABNORMAL HIGH (ref 0.61–1.12)

## 2019-07-10 LAB — BASIC METABOLIC PANEL
Anion gap: 10 (ref 5–15)
BUN: 10 mg/dL (ref 6–20)
CO2: 24 mmol/L (ref 22–32)
Calcium: 9.3 mg/dL (ref 8.9–10.3)
Chloride: 103 mmol/L (ref 98–111)
Creatinine, Ser: 1.04 mg/dL — ABNORMAL HIGH (ref 0.44–1.00)
GFR calc Af Amer: >60 mL/min (ref >60)
GFR calc non Af Amer: 58 mL/min — ABNORMAL LOW (ref >60)
Glucose, Bld: 117 mg/dL — ABNORMAL HIGH (ref 70–99)
Potassium: 3.7 mmol/L (ref 3.5–5.1)
Sodium: 137 mmol/L (ref 135–145)

## 2019-07-10 LAB — TROPONIN I (HIGH SENSITIVITY)
Troponin I (High Sensitivity): 7 ng/L (ref <18)
Troponin I (High Sensitivity): 7 ng/L (ref <18)

## 2019-07-10 LAB — CBC
HCT: 39.3 % (ref 36.0–46.0)
Hemoglobin: 12.8 g/dL (ref 12.0–15.0)
MCH: 26.4 pg (ref 26.0–34.0)
MCHC: 32.6 g/dL (ref 30.0–36.0)
MCV: 81 fL (ref 80.0–100.0)
Platelets: 377 10*3/uL (ref 150–400)
RBC: 4.85 MIL/uL (ref 3.87–5.11)
RDW: 13.8 % (ref 11.5–15.5)
WBC: 5.8 10*3/uL (ref 4.0–10.5)
nRBC: 0 % (ref 0.0–0.2)

## 2019-07-10 LAB — TSH: TSH: 1.424 u[IU]/mL (ref 0.350–4.500)

## 2019-07-10 LAB — BRAIN NATRIURETIC PEPTIDE: B Natriuretic Peptide: 395 pg/mL — ABNORMAL HIGH (ref 0.0–100.0)

## 2019-07-10 MED ORDER — SODIUM CHLORIDE 0.9% FLUSH: 3.0000 mL | Freq: Once | INTRAVENOUS | Status: DC

## 2019-07-10 NOTE — Discharge Instructions (Addendum)
Your work-up was reassuring.  You should follow with your cardiologist.  Return to the ER if your symptoms are worsening or any other concerns  Free t4 1.24 slightly elevated can be followed with your primary doctor

## 2019-07-10 NOTE — ED Notes (Signed)
Pt states she has had hysterectomy, POC preg dc'd

## 2019-07-10 NOTE — ED Notes (Signed)
Pt states she has been SOB x several months but today was the worst. Pt calm and with RR WNL, no apparent distress. Lungs clear bilaterally.

## 2019-07-10 NOTE — ED Notes (Signed)
EDP in room, EDP wants this RN to wait to do assessments./ VS

## 2019-07-10 NOTE — ED Provider Notes (Signed)
[facility] Emergency Department Provider Note  ____________________________________________   First MD Initiated Contact with Patient 07/10/19 1823     (approximate)  I have reviewed the triage vital signs and the nursing notes.   HISTORY  Chief Complaint Near syncope    HPI [name] is a 59 y.o. female with hypertension, asthma, anxiety, paroxysmal A. fib who comes in with episode of shortness of breath.  Patient reported that she was walking when she felt lightheaded nauseous and sweaty all over and felt like she was about to pass out.  She also had a little bit of shortness of breath associated with it.  She states that she has had some intermittent chest pain as well but had a work-up for that that was negative.  Patient has been compliant with her medications.  She stated that today she also was more fatigued than normal and slept in later than her normal.  She does report some history of thyroid dysfunction in the past has not been checked recently.  She denies any abdominal pain.  She does also report some chronic pain in her right hip and left lower back.  Patient is on oxycodone for this and has been compliant.  She denies any new urinary symptoms.  She does have a little bit of sciatica down her right leg but denies any evidence of cord compression   To note patient had a left heart cath on 3/3 that was negative.  Also patient is on Eliquis for paroxysmal A. fib.  Patient was also recently started on Lasix for concern for little bit of volume overload.          Past Medical History:  Diagnosis Date  . Abnormal EKG    HX OF INVERTED T WAVES ON EKG, PALPITATIONS, CHEST PAINS-CARDIAC WORK UP DID NOT SHOW ANY HEART DISEASE  . Acute postoperative pain 01/04/2017  . 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 Anemia    Iron Infusion-8 yrs ago  . Anxiety   . Asthma    INHALERS WITH FLARE -UPS  . Cephalalgia 08/18/2014  . Cervical disc disease 08/18/2014   Needs neck surgery.   . Chronic headaches   . DDD (degenerative disc disease)    CERVICAL AND LUMBAR-CHRONIC PAIN, RT HIP LABRAL TEAR  . Depression    PT STATES A LOT OF STRESS IN HER LIFE  . Dissociative disorder   . Dizziness 04/22/2013  . Duodenal ulcer with hemorrhage and perforation ([place]) 04/27/2003  . GERD (gastroesophageal reflux disease)   . H/O arthrodesis 08/18/2014  . Headache(784.0)    AND NECK PAIN--STATES RECENT TEST SHOW CERVICAL DEGENERATION  . History of blood transfusion    s/p back surgery  . History of cervical spinal surgery 01/04/2015  . History of kidney stones   . Hypertension   . Inverted T wave   . Narrowing of intervertebral disc space 08/18/2014   Currently on disability.   . Orthostatic hypotension 04/22/2013  . Pain    CHRONIC NECK AND BACK PAIN - LIMITED ROM NECK - S/P FUSIONS CERVICAL AND LUMBAR  . Pneumonia   . PONV (postoperative nausea and vomiting)    PT GIVES HX OF N&V AND FEVER WITH SURGERIES YEARS AGO--BUT NO PROBLEMS WITH MORE RECENT SURGERIES--STATES NOT MALIGNANT HYPERTHERMIA  . Postop Hyponatremia 05/14/2012  . Postoperative anemia due to acute blood loss 05/14/2012  . PTSD (post-traumatic stress disorder)   . Right foot drop   . Right hip arthralgia 08/18/2014  Status post surgery of right, and now needs left.     Patient Active Problem List   Diagnosis Date Noted  . Pure hypercholesterolemia 06/27/2019  . Constipation due to opioid therapy 06/27/2019  . Snoring 06/27/2019  . Unstable angina ([place]) 06/10/2019  . Chronic musculoskeletal pain 01/14/2019  . Hypertension 12/27/2018  . Spondylosis without myelopathy or radiculopathy, lumbosacral region 11/12/2018  . History of allergy to radiographic contrast media 11/12/2018  . History of allergy to shellfish 11/12/2018  . DDD (degenerative disc disease), lumbosacral 07/31/2018  . Neuropathic pain 07/31/2018  . Neurogenic pain 07/31/2018  . Vitamin D deficiency 05/28/2018  . History of  lumbar surgery 05/28/2018  . Groin pain, chronic, left 05/20/2018  . Other intervertebral disc degeneration, lumbar region 05/20/2018  . Disorder of skeletal system 05/20/2018  . Problems influencing health status 05/20/2018  . Overweight 12/25/2017  . Enthesopathy of hip region (Left) 07/13/2017  . Trigger point of shoulder region (Left) 02/26/2017  . Pharmacologic therapy   . Polyp of sigmoid colon   . Problems with swallowing and mastication   . Chronic shoulder arthropathy (Left) 01/04/2017  . Dysphagia 12/07/2016  . Abnormal flushing and sweating 12/07/2016  . Long term prescription benzodiazepine use 10/11/2016  . 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 Chronic hip pain (Secondary Area of Pain) (Bilateral) (L>R) 10/11/2016  . 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 Cervicogenic headache 07/06/2015  . History of total hip replacement (THR) (Right) 07/05/2015  . Chronic shoulder radicular pain (Bilateral) (L>R) 07/05/2015  . Lumbar facet syndrome (Bilateral) (L>R) 07/05/2015  . Chronic low back pain (Primary area of Pain) (Bilateral) (L>R) w/o sciatica 04/05/2015  . Chronic hip pain (Right) 01/04/2015  . Encounter for therapeutic drug level monitoring 01/04/2015  . Long term current use of opiate analgesic 01/04/2015  . Long term prescription opiate use 01/04/2015  . Opiate use 01/04/2015  . Chronic pain syndrome 01/04/2015  . Steroid intolerance 01/04/2015  . Cervical spondylosis 01/04/2015  . Cervical facet arthropathy (Bilateral) 01/04/2015  . History of cervical spinal surgery 01/04/2015  . Chronic shoulder pain [facility] Area of Pain)  (Bilateral) (L>R) 01/04/2015  . 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 Lumbar spondylosis 01/04/2015  . Cervical facet syndrome (Bilateral) 01/04/2015  . Chronic sacroiliac joint pain (Left) 01/04/2015  . DOE (dyspnea on  exertion) 09/03/2014  . Asthma, mild 08/18/2014  . Blurred vision 08/18/2014  . Benign paroxysmal positional nystagmus 08/18/2014  . Clinical depression 08/18/2014  . Fatigue 08/18/2014  . Cannot sleep 08/18/2014  . Awareness of heartbeats 08/18/2014  . RAD (reactive airway disease) 08/18/2014  . Apnea, sleep 08/18/2014  . Chronic neck pain 04/16/2013  . Anxiety state 08/03/2003  . Colon, diverticulosis 07/15/2003  . IBS (irritable bowel syndrome) 04/27/2003  . Esophagitis, reflux 02/11/2003  . 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 ABDOMINAL HYSTERECTOMY    . afib  05/2019  . ANTERIOR CERVICAL DECOMP/DISCECTOMY FUSION N/A 10/30/2012   Procedure: ACDF C5-6, EXPLORATION AND HARDWARE REMOVAL C6-7;  Surgeon: Melina Schools, MD;  Location: [place];  Service: Orthopedics;  Laterality: N/A;  . ANTERIOR FUSION CERVICAL SPINE  MAY 2012   AT [facility]  . BACK SURGERY  2009   LUMBAR FUSION WITH RODS   . BREAST BIOPSY Right 2008   benign.- bx/clip  . BREAST EXCISIONAL BIOPSY Left 1998  benign  . BREAST REDUCTION SURGERY Bilateral 06/2016  . CARPAL TUNNEL RELEASE  05-06-12   Right  . CHOLECYSTECTOMY    . COLONOSCOPY WITH PROPOFOL N/A 01/26/2017   Procedure: COLONOSCOPY WITH PROPOFOL;  Surgeon: Lucilla Lame, MD;  Location: [place];  Service: Endoscopy;  Laterality: N/A;  . DIAGNOSTIC LAPAROSCOPIES - MULTIPLE FOR ENDOMETRIOSIS    . ESOPHAGEAL DILATION N/A 01/26/2017   Procedure: ESOPHAGEAL DILATION;  Surgeon: Lucilla Lame, MD;  Location: [place];  Service: Endoscopy;  Laterality: N/A;  . ESOPHAGEAL MANOMETRY N/A 05/30/2017   Procedure: ESOPHAGEAL MANOMETRY (EM);  Surgeon: Lucilla Lame, MD;  Location: [facility];  Service: Endoscopy;  Laterality: N/A;  . ESOPHAGOGASTRODUODENOSCOPY (EGD) WITH PROPOFOL N/A 01/26/2017   Procedure: ESOPHAGOGASTRODUODENOSCOPY (EGD) WITH PROPOFOL;  Surgeon: Lucilla Lame, MD;   Location: [place];  Service: Endoscopy;  Laterality: N/A;  . HIP ARTHROSCOPY  09/20/2011   Procedure: ARTHROSCOPY HIP;  Surgeon: Gearlean Alf, MD;  Location: [facility];  Service: Orthopedics;  Laterality: Right;  Right Hip Scope with Labral Debridement  . LEFT HEART CATH AND CORONARY ANGIOGRAPHY Left 06/10/2019   Procedure: LEFT HEART CATH AND CORONARY ANGIOGRAPHY;  Surgeon: Nelva Bush, MD;  Location: [place] [facility];  Service: Cardiovascular;  Laterality: Left;  . NASAL SEPTUM SURGERY  MARCH 2013   IN [place]  . [place] IMPEDANCE STUDY N/A 05/30/2017   Procedure: [place] IMPEDANCE STUDY;  Surgeon: Lucilla Lame, MD;  Location: [facility];  Service: Endoscopy;  Laterality: N/A;  . POLYPECTOMY N/A 01/26/2017   Procedure: POLYPECTOMY;  Surgeon: Lucilla Lame, MD;  Location: [place];  Service: Endoscopy;  Laterality: N/A;  . POSTERIOR CERVICAL FUSION/FORAMINOTOMY N/A 04/16/2013   Procedure: REMOVAL CERVICAL PLATES AND INTERBODY CAGE/POSTERIOR CERVICAL SPINAL FUSION C4 - C6/C5 CORPECTOMY/C4 - C6 FUSION WITH ILIAC CREST BONE GRAFT;  Surgeon: Melina Schools, MD;  Location: [place];  Service: Orthopedics;  Laterality: N/A;  . RADIOFREQUENCY ABLATION NERVES    . REDUCTION MAMMAPLASTY Bilateral 05/2016  . RIGHT HIP ARTHROSCOPY FOR LABRAL TEAR  ABOUT 2010   2012 also  . SHOULDER ARTHROSCOPY  05-06-12   bone spur  . TONSILLECTOMY     AS A CHILD  . TOTAL HIP ARTHROPLASTY Right 05/13/2012   Procedure: TOTAL HIP ARTHROPLASTY ANTERIOR APPROACH;  Surgeon: Gearlean Alf, MD;  Location[facility];  Service: Orthopedics;  Laterality: Right;    Prior to Admission medications   Medication Sig Start Date End Date Taking? Authorizing Provider  acetaminophen (TYLENOL) 500 MG tablet Take 500 mg by mouth every 6 (six) hours as needed.    [provider]  albuterol (PROVENTIL HFA) 108 (90 Base) MCG/ACT inhaler Inhale 1-2 puffs into the lungs every 4 (four) hours as needed.     [provider]  AMITIZA 24 MCG capsule Take 1 capsule (24 mcg total) by mouth 2 (two) times daily with a meal. 06/27/19   Bacigalupo, Dionne Bucy, MD  apixaban (ELIQUIS) 5 MG TABS tablet Take 1 tablet (5 mg total) by mouth 2 (two) times daily. 05/28/19   Kate Sable, MD  calcium carbonate (TUMS - DOSED IN MG ELEMENTAL CALCIUM) 500 MG chewable tablet Chew 1 tablet by mouth 3 (three) times daily with meals. As needed    [provider]  cyclobenzaprine (FLEXERIL) 5 MG tablet Take 1 tablet (5 mg total) by mouth 3 (three) times daily as needed for muscle spasms. 07/04/19   Virginia Crews, MD  Docusate Sodium (COLACE PO) Take by  mouth daily.     [provider]  Fluticasone-Salmeterol (ADVAIR) 500-50 MCG/DOSE AEPB Inhale 1 puff into the lungs 2 (two) times daily. As needed    [provider]  furosemide (LASIX) 40 MG tablet Take 1 tablet (40 mg total) by mouth every other day. Take 1 tablet (40 mg) by mouth every other day x 7 days (total of 4 doses) 06/26/19 09/24/19  Kate Sable, MD  metoprolol succinate (TOPROL-XL) 50 MG 24 hr tablet TAKE 1 TABLET BY MOUTH EVERY DAY 07/10/19   Brita Romp, Dionne Bucy, MD  montelukast (SINGULAIR) 10 MG tablet TAKE 1 TABLET BY MOUTH EVERY DAY 06/23/19   Bacigalupo, Dionne Bucy, MD  omeprazole (PRILOSEC) 40 MG capsule Take 40 mg by mouth in the morning and at bedtime.    [provider]  ondansetron (ZOFRAN) 4 MG tablet TAKE 1 TABLET BY MOUTH EVERY 8 HOURS AS NEEDED FOR NAUSEA AND VOMITING 06/23/19   Bacigalupo, Dionne Bucy, MD  Oxycodone HCl 10 MG TABS Take 1 tablet (10 mg total) by mouth every 6 (six) hours as needed. Must last 30 days 05/26/19 06/26/19  Milinda Pointer, MD  Oxycodone HCl 10 MG TABS Take 1 tablet (10 mg total) by mouth every 6 (six) hours as needed. Must last 30 days 06/25/19 07/25/19  Milinda Pointer, MD  Oxycodone HCl 10 MG TABS Take 1 tablet (10 mg total) by mouth every 6 (six) hours as needed. Must last 30 days  07/25/19 08/24/19  Milinda Pointer, MD  pantoprazole (PROTONIX) 40 MG tablet Take 1 tablet (40 mg total) by mouth 2 (two) times daily. 03/25/19   Lucilla Lame, MD  Spacer/Aero-Holding Chambers (AEROCHAMBER MV) inhaler by Does not apply route. 06/24/14   [provider]  valACYclovir (VALTREX) 1000 MG tablet TAKE TWO TABLETS BY MOUTH TWICE DAILY FOR ONE DAY FEVER BLISTER 05/10/18   Bacigalupo, Dionne Bucy, MD    Allergies Amoxicillin, Chlorhexidine gluconate, Clindamycin, Codeine, Erythromycin, Penicillin g, Sulfa antibiotics, Levofloxacin, Shellfish allergy, Decadron [dexamethasone], Mangifera indica, Papaya derivatives, Betadine [povidone iodine], Chlorhexidine, Clarithromycin, Clindamycin/lincomycin, Other, Povidone-iodine, and Prednisone  Family History  Problem Relation Age of Onset  . Aneurysm Mother   . Aneurysm Maternal Grandmother   . Breast cancer Paternal Grandmother 76  . Bipolar disorder Sister   . Bipolar disorder Grandchild   . Anxiety disorder Grandchild   . Depression Grandchild     Social History Social History   Tobacco Use  . Smoking status: Never Smoker  . Smokeless tobacco: Never Used  Substance Use Topics  . Alcohol use: No    Alcohol/week: 0.0 standard drinks  . Drug use: No      Review of Systems Constitutional: No fever/chills positive getting hot all over Eyes: No visual changes. ENT: No sore throat. Cardiovascular: No chest pain Respiratory: Positive for SOB Gastrointestinal: No abdominal pain.  Positive nausea, no vomiting.  No diarrhea.  No constipation. Genitourinary: Negative for dysuria. Musculoskeletal: Negative for back pain. Skin: Negative for rash. Neurological: Negative for headaches, focal weakness or numbness.  Positive lightheadedness, near syncope All other ROS negative ____________________________________________   PHYSICAL EXAM:  VITAL SIGNS: ED Triage Vitals  Enc Vitals Group     BP 07/10/19 1519 103/62     Pulse  Rate 07/10/19 1519 70     Resp 07/10/19 1519 18     Temp 07/10/19 1519 97.7 F (36.5 C)     Temp Source 07/10/19 1519 Oral     SpO2 07/10/19 1519 99 %  Weight 07/10/19 1528 155 lb (70.3 kg)     Height 07/10/19 1528 5\' 4"  (1.626 m)     Head Circumference --      Peak Flow --      Pain Score 07/10/19 1527 6     Pain Loc --      Pain Edu? --      Excl. in [place]? --     Constitutional: Alert and oriented. Well appearing and in no acute distress. Eyes: Conjunctivae are normal. EOMI. Head: Atraumatic. Nose: No congestion/rhinnorhea. Mouth/Throat: Mucous membranes are moist.   Neck: No stridor. Trachea Midline. FROM Cardiovascular: Normal rate, regular rhythm. Grossly normal heart sounds.  Good peripheral circulation. Respiratory: Clear lungs, no increased work of breathing Gastrointestinal: Soft and nontender. No distention. No abdominal bruits.  Musculoskeletal: No lower extremity tenderness nor edema.  No joint effusions. Neurologic:  Normal speech and language. No gross focal neurologic deficits are appreciated.  Skin:  Skin is warm, dry and intact. No rash noted. Psychiatric: Mood and affect are normal. Speech and behavior are normal. GU: Deferred   ____________________________________________   LABS (all labs ordered are listed, but only abnormal results are displayed)  Labs Reviewed  BASIC METABOLIC PANEL - Abnormal; Notable for the following components:      Result Value   Glucose, Bld 117 (*)    Creatinine, Ser 1.04 (*)    GFR calc non Af Amer 58 (*)    All other components within normal limits  BRAIN NATRIURETIC PEPTIDE - Abnormal; Notable for the following components:   B Natriuretic Peptide 395.0 (*)    All other components within normal limits  CBC  URINALYSIS, COMPLETE (UACMP) WITH MICROSCOPIC  TSH  T4, FREE  CBG MONITORING, ED  POC URINE PREG, ED  TROPONIN I (HIGH SENSITIVITY)  TROPONIN I (HIGH SENSITIVITY)    ____________________________________________   ED ECG REPORT I, Vanessa Milligan, the attending physician, personally viewed and interpreted this ECG.  EKG is normal sinus rate of 70, no ST elevation, T wave inversion in lead V3, normal intervals ____________________________________________  RADIOLOGY Robert Bellow, personally viewed and evaluated these images (plain radiographs) as part of my medical decision making, as well as reviewing the written report by the radiologist.  ED MD interpretation: No signs of pneumonia or pleural effusion  Official radiology report(s): DG Chest 2 View  Result Date: 07/10/2019 CLINICAL DATA:  Shortness of breath EXAM: CHEST - 2 VIEW COMPARISON:  05/10/2018 FINDINGS: Posterior spinal rods and fixating screws in the thoracolumbar spine. No acute airspace disease or effusion. Normal cardiomediastinal silhouette. No pneumothorax. Incompletely visualized cervical hardware. IMPRESSION: No active cardiopulmonary disease. Electronically Signed   By: Donavan Foil M.D.   On: 07/10/2019 16:09    ____________________________________________   PROCEDURES  Procedure(s) performed (including Critical Care):  Procedures   ____________________________________________   INITIAL IMPRESSION / ASSESSMENT AND PLAN / ED COURSE   ADERYN CROYLE was evaluated in Emergency Department on 07/10/2019 for the symptoms described in the history of present illness. She was evaluated in the context of the global COVID-19 pandemic, which necessitated consideration that the patient might be at risk for infection with the SARS-CoV-2 virus that causes COVID-19. Institutional protocols and algorithms that pertain to the evaluation of patients at risk for COVID-19 are in a state of rapid change based on information released by regulatory bodies including the CDC and federal and state organizations. These policies and algorithms were followed during the patient's care in the ED.  Pt presents with SOB. Differential includes: PNA-will get xray to evaluation Anemia-CBC to evaluate ACS- will get trops Arrhythmia-Will get EKG and keep on monitor.  COVID- will get testing per algorithm. PE-lower suspicion given no risk factors and other cause more likely   Kidney function slightly elevated 1.04 up from baseline 0.87 No white count to suggest infection No evidence of anemia Troponin 7 BNP slightly elevated 395  Patient declined Covid test and I think it is reasonable given she has been vaccinated.  Discussed with patient that this is most likely a nonemergent cause of near syncope.  Patient's had a catheterization that is been negative as well as a echocardiogram yesterday that was reassuring.  She is got no evidence of significant fluid overload.  Do not think she has a PE given she is on Eliquis.  Patient does report a lot of stress raising her 3 grandchildren and was wondering if that could be contributing.  We discussed that your stress can be a contributor but would like to rule out other things first.  She denies any SI and has a psychiatrist and therapist that she follows with.  There is no signs of anemia.  Kidney function is slightly up but she looks euvolemic on examination.  Will ambulate patient to make sure there is no signs of desaturation.  Added on some thyroid test as well.  Patient does not desat with ambulation.   Thyroid levels are reassuring.  Slightly elevated T4 which would not be consistent with her symptoms, troponin negative x2.   At this point patient feels comfortable going home and can return to the ER if her symptoms are worsening.  I discussed the provisional nature of ED diagnosis, the treatment so far, the ongoing plan of care, follow up appointments and return precautions with the patient and any family or support people present. They expressed understanding and agreed with the plan, discharged  home.       ____________________________________________   FINAL CLINICAL IMPRESSION(S) / ED DIAGNOSES   Final diagnoses:  Near syncope     MEDICATIONS GIVEN DURING THIS VISIT:  Medications - No data to display   ED Discharge Orders    None       Note:  This document was prepared using Dragon voice recognition software and may include unintentional dictation errors.   Vanessa [place], MD 07/10/19 [phone]

## 2019-07-10 NOTE — ED Notes (Signed)
Pt ambulating in room, no apparent distress noted, pt able to have conversation with this RN while walking. Pt's sats between 95 and 100% while walking and talking.

## 2019-07-10 NOTE — ED Triage Notes (Signed)
Pt arrives POV from home with reports of being more tired than usual. Reports she is usually up at 7 am and she slept until 12pm. Pt reports she was at the store and started feeling sweaty associated with SHOB. Reports chest pain with these episodes, states she had a catheterization 3 weeks ago that showed no blockages. Skin warm, dry and intact. A&Ox4 and in NAD.

## 2019-07-14 ENCOUNTER — Ambulatory Visit (INDEPENDENT_AMBULATORY_CARE_PROVIDER_SITE_OTHER): Payer: Medicare Other | Admitting: Cardiology

## 2019-07-14 ENCOUNTER — Encounter: Payer: Self-pay | Admitting: Cardiology

## 2019-07-14 ENCOUNTER — Other Ambulatory Visit: Payer: Self-pay

## 2019-07-14 VITALS — BP 129/87 | HR 56 | Ht 64.0 in | Wt 154.4 lb

## 2019-07-14 DIAGNOSIS — I480 Paroxysmal atrial fibrillation: Secondary | ICD-10-CM

## 2019-07-14 DIAGNOSIS — I10 Essential (primary) hypertension: Secondary | ICD-10-CM

## 2019-07-14 MED ORDER — METOPROLOL SUCCINATE ER 25 MG PO TB24: 25.0000 mg | ORAL_TABLET | Freq: Every day | ORAL | 1 refills | Status: DC

## 2019-07-14 NOTE — Patient Instructions (Addendum)
Medication Instructions:  Your physician has recommended you make the following change in your medication:   DECREASE Metoprolol to 25 mg daily. An Rx has been sent to your pharmacy.  *If you need a refill on your cardiac medications before your next appointment, please call your pharmacy*   Lab Work: None ordered If you have labs (blood work) drawn today and your tests are completely normal, you will receive your results only by: Marland Kitchen MyChart Message (if you have MyChart) OR . A paper copy in the mail If you have any lab test that is abnormal or we need to change your treatment, we will call you to review the results.   Testing/Procedures: None ordered   Follow-Up: At [facility], you and your health needs are our priority.  As part of our continuing mission to provide you with exceptional heart care, we have created designated Provider Care Teams.  These Care Teams include your primary Cardiologist (physician) and Advanced Practice Providers (APPs -  Physician Assistants and Nurse Practitioners) who all work together to provide you with the care you need, when you need it.  We recommend signing up for the patient portal called "MyChart".  Sign up information is provided on this After Visit Summary.  MyChart is used to connect with patients for Virtual Visits (Telemedicine).  Patients are able to view lab/test results, encounter notes, upcoming appointments, etc.  Non-urgent messages can be sent to your provider as well.   To learn more about what you can do with MyChart, go to [email or web address].    Your next appointment:   3 month(s)  The format for your next appointment:   In Person  Provider:    You may see Kate Sable, MD or one of the following Advanced Practice Providers on your designated Care Team:    Murray Hodgkins, NP  Christell Faith, PA-C  Marrianne Mood, PA-C    Other Instructions N/A

## 2019-07-14 NOTE — Progress Notes (Signed)
Cardiology Office Note:    Date:  07/14/2019   ID:  [name], DOB 10-13-1960, MRN [id number]  PCP:  Virginia Crews, MD  Cardiologist:  Kate Sable, MD  Electrophysiologist:  None   Referring MD: Virginia Crews, MD   Chief Complaint  Patient presents with  . office visit    F/U after echo. Seen in ER last week-near syncope. Patient reports fatigue and (SOB more often with exertion); Meds verbally reviewed with patient.    History of Present Illness:    [name] is a 59 y.o. female with a hx of hypertension, paroxysmal atrial fibrillation on Eliquis, asthma, anxiety, presents for follow-up.  She was previously seen due to history of palpitations with a Holter monitor was placed and showed atrial fibrillation 3% burden.  Eliquis and Toprol-XL was started.  Echocardiogram was ordered to evaluate cardiac function and chamber sizes.  Prior left heart cath did not show any CAD.  Recent feeling of lightheadedness and fatigue.  Presented to the emergency room.  Work-up was unrevealing.   Past Medical History:  Diagnosis Date  . Abnormal EKG    HX OF INVERTED T WAVES ON EKG, PALPITATIONS, CHEST PAINS-CARDIAC WORK UP DID NOT SHOW ANY HEART DISEASE  . Acute postoperative pain 01/04/2017  . 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 Anemia    Iron Infusion-8 yrs ago  . Anxiety   . Asthma    INHALERS WITH FLARE -UPS  . Cephalalgia 08/18/2014  . Cervical disc disease 08/18/2014   Needs neck surgery.   . Chronic headaches   . DDD (degenerative disc disease)    CERVICAL AND LUMBAR-CHRONIC PAIN, RT HIP LABRAL TEAR  . Depression    PT STATES A LOT OF STRESS IN HER LIFE  . Dissociative disorder   . Dizziness 04/22/2013  . Duodenal ulcer with hemorrhage and perforation ([place]) 04/27/2003  . GERD (gastroesophageal reflux disease)   . H/O arthrodesis 08/18/2014  . Headache(784.0)    AND NECK PAIN--STATES RECENT TEST SHOW CERVICAL DEGENERATION  . History of blood transfusion    s/p  back surgery  . History of cervical spinal surgery 01/04/2015  . History of kidney stones   . Hypertension   . Inverted T wave   . Narrowing of intervertebral disc space 08/18/2014   Currently on disability.   . Orthostatic hypotension 04/22/2013  . Pain    CHRONIC NECK AND BACK PAIN - LIMITED ROM NECK - S/P FUSIONS CERVICAL AND LUMBAR  . Pneumonia   . PONV (postoperative nausea and vomiting)    PT GIVES HX OF N&V AND FEVER WITH SURGERIES YEARS AGO--BUT NO PROBLEMS WITH MORE RECENT SURGERIES--STATES NOT MALIGNANT HYPERTHERMIA  . Postop Hyponatremia 05/14/2012  . Postoperative anemia due to acute blood loss 05/14/2012  . PTSD (post-traumatic stress disorder)   . Right foot drop   . 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     Past Surgical History:  Procedure Laterality Date  . ABDOMINAL HYSTERECTOMY    . afib  05/2019  . ANTERIOR CERVICAL DECOMP/DISCECTOMY FUSION N/A 10/30/2012   Procedure: ACDF C5-6, EXPLORATION AND HARDWARE REMOVAL C6-7;  Surgeon: Melina Schools, MD;  Location: [place];  Service: Orthopedics;  Laterality: N/A;  . ANTERIOR FUSION CERVICAL SPINE  MAY 2012   AT [facility]  . BACK SURGERY  2009   LUMBAR FUSION WITH RODS   . BREAST BIOPSY Right 2008   benign.- bx/clip  . BREAST EXCISIONAL  BIOPSY Left 1998   benign  . BREAST REDUCTION SURGERY Bilateral 06/2016  . CARPAL TUNNEL RELEASE  05-06-12   Right  . CHOLECYSTECTOMY    . COLONOSCOPY WITH PROPOFOL N/A 01/26/2017   Procedure: COLONOSCOPY WITH PROPOFOL;  Surgeon: Lucilla Lame, MD;  Location: [place];  Service: Endoscopy;  Laterality: N/A;  . DIAGNOSTIC LAPAROSCOPIES - MULTIPLE FOR ENDOMETRIOSIS    . ESOPHAGEAL DILATION N/A 01/26/2017   Procedure: ESOPHAGEAL DILATION;  Surgeon: Lucilla Lame, MD;  Location: [place];  Service: Endoscopy;  Laterality: N/A;  . ESOPHAGEAL MANOMETRY N/A 05/30/2017   Procedure: ESOPHAGEAL MANOMETRY (EM);  Surgeon: Lucilla Lame, MD;   Location: [facility];  Service: Endoscopy;  Laterality: N/A;  . ESOPHAGOGASTRODUODENOSCOPY (EGD) WITH PROPOFOL N/A 01/26/2017   Procedure: ESOPHAGOGASTRODUODENOSCOPY (EGD) WITH PROPOFOL;  Surgeon: Lucilla Lame, MD;  Location: [place];  Service: Endoscopy;  Laterality: N/A;  . HIP ARTHROSCOPY  09/20/2011   Procedure: ARTHROSCOPY HIP;  Surgeon: Gearlean Alf, MD;  Location: [facility];  Service: Orthopedics;  Laterality: Right;  Right Hip Scope with Labral Debridement  . LEFT HEART CATH AND CORONARY ANGIOGRAPHY Left 06/10/2019   Procedure: LEFT HEART CATH AND CORONARY ANGIOGRAPHY;  Surgeon: Nelva Bush, MD;  Location: [place] [facility];  Service: Cardiovascular;  Laterality: Left;  . NASAL SEPTUM SURGERY  MARCH 2013   IN [place]  . [place] IMPEDANCE STUDY N/A 05/30/2017   Procedure: [place] IMPEDANCE STUDY;  Surgeon: Lucilla Lame, MD;  Location: [facility];  Service: Endoscopy;  Laterality: N/A;  . POLYPECTOMY N/A 01/26/2017   Procedure: POLYPECTOMY;  Surgeon: Lucilla Lame, MD;  Location: [place];  Service: Endoscopy;  Laterality: N/A;  . POSTERIOR CERVICAL FUSION/FORAMINOTOMY N/A 04/16/2013   Procedure: REMOVAL CERVICAL PLATES AND INTERBODY CAGE/POSTERIOR CERVICAL SPINAL FUSION C4 - C6/C5 CORPECTOMY/C4 - C6 FUSION WITH ILIAC CREST BONE GRAFT;  Surgeon: Melina Schools, MD;  Location: [place];  Service: Orthopedics;  Laterality: N/A;  . RADIOFREQUENCY ABLATION NERVES    . REDUCTION MAMMAPLASTY Bilateral 05/2016  . RIGHT HIP ARTHROSCOPY FOR LABRAL TEAR  ABOUT 2010   2012 also  . SHOULDER ARTHROSCOPY  05-06-12   bone spur  . TONSILLECTOMY     AS A CHILD  . TOTAL HIP ARTHROPLASTY Right 05/13/2012   Procedure: TOTAL HIP ARTHROPLASTY ANTERIOR APPROACH;  Surgeon: Gearlean Alf, MD;  Location: [facility];  Service: Orthopedics;  Laterality: Right;    Current Medications: Current Meds  Medication Sig  . acetaminophen (TYLENOL) 500 MG tablet Take 500 mg by mouth every 6 (six)  hours as needed.  Marland Kitchen albuterol (PROVENTIL HFA) 108 (90 Base) MCG/ACT inhaler Inhale 1-2 puffs into the lungs every 4 (four) hours as needed.   . ALPRAZolam (XANAX) 1 MG tablet Take 1 mg by mouth daily as needed.  . AMITIZA 24 MCG capsule Take 1 capsule (24 mcg total) by mouth 2 (two) times daily with a meal.  . apixaban (ELIQUIS) 5 MG TABS tablet Take 1 tablet (5 mg total) by mouth 2 (two) times daily.  . calcium carbonate (TUMS - DOSED IN MG ELEMENTAL CALCIUM) 500 MG chewable tablet Chew 1 tablet by mouth 3 (three) times daily with meals. As needed  . cyclobenzaprine (FLEXERIL) 5 MG tablet Take 1 tablet (5 mg total) by mouth 3 (three) times daily as needed for muscle spasms.  Mariane Baumgarten Sodium (COLACE PO) Take by mouth daily.   . Fluticasone-Salmeterol (ADVAIR) 500-50 MCG/DOSE AEPB Inhale 1 puff into the lungs 2 (two) times  daily. As needed  . furosemide (LASIX) 40 MG tablet Take 1 tablet (40 mg total) by mouth every other day. Take 1 tablet (40 mg) by mouth every other day x 7 days (total of 4 doses)  . metoprolol succinate (TOPROL-XL) 50 MG 24 hr tablet TAKE 1 TABLET BY MOUTH EVERY DAY  . montelukast (SINGULAIR) 10 MG tablet TAKE 1 TABLET BY MOUTH EVERY DAY  . omeprazole (PRILOSEC) 40 MG capsule Take 40 mg by mouth in the morning and at bedtime.  . ondansetron (ZOFRAN) 4 MG tablet TAKE 1 TABLET BY MOUTH EVERY 8 HOURS AS NEEDED FOR NAUSEA AND VOMITING  . Oxycodone HCl 10 MG TABS Take 1 tablet (10 mg total) by mouth every 6 (six) hours as needed. Must last 30 days  . [START ON 07/25/2019] Oxycodone HCl 10 MG TABS Take 1 tablet (10 mg total) by mouth every 6 (six) hours as needed. Must last 30 days  . pantoprazole (PROTONIX) 40 MG tablet Take 1 tablet (40 mg total) by mouth 2 (two) times daily.  Marland Kitchen Spacer/Aero-Holding Chambers (AEROCHAMBER MV) inhaler by Does not apply route.  . valACYclovir (VALTREX) 1000 MG tablet TAKE TWO TABLETS BY MOUTH TWICE DAILY FOR ONE DAY FEVER BLISTER     Allergies:    Amoxicillin, Chlorhexidine gluconate, Clindamycin, Codeine, Erythromycin, Penicillin g, Sulfa antibiotics, Levofloxacin, Shellfish allergy, Decadron [dexamethasone], Mangifera indica, Papaya derivatives, Betadine [povidone iodine], Chlorhexidine, Clarithromycin, Clindamycin/lincomycin, Other, Povidone-iodine, and Prednisone   Social History   Socioeconomic History  . Marital status: Married    Spouse name: bruce  . Number of children: 2  . Years of education: Not on file  . Highest education level: Associate degree: occupational, Hotel manager, or vocational program  Occupational History    Comment: disabled  Tobacco Use  . Smoking status: Never Smoker  . Smokeless tobacco: Never Used  Substance and Sexual Activity  . Alcohol use: No    Alcohol/week: 0.0 standard drinks  . Drug use: No  . Sexual activity: Yes  Other Topics Concern  . Not on file  Social History Narrative   Son, daughter  And 4 grandkids; raises 3 of the grandchildren   Social Determinants of Health   Financial Resource Strain:   . Difficulty of Paying Living Expenses:   Food Insecurity: Food Insecurity Present  . Worried About Charity fundraiser in the Last Year: Sometimes true  . Ran Out of Food in the Last Year: Not on file  Transportation Needs:   . Lack of Transportation (Medical):   Marland Kitchen Lack of Transportation (Non-Medical):   Physical Activity: Insufficiently Active  . Days of Exercise per Week: 3 days  . Minutes of Exercise per Session: 30 min  Stress:   . Feeling of Stress :   Social Connections:   . Frequency of Communication with Friends and Family:   . Frequency of Social Gatherings with Friends and Family:   . Attends Religious Services:   . Active Member of Clubs or Organizations:   . Attends Archivist Meetings:   Marland Kitchen Marital Status:      Family History: The patient's family history includes Aneurysm in her maternal grandmother and mother; Anxiety disorder in her grandchild; Bipolar  disorder in her grandchild and sister; Breast cancer (age of onset: 33) in her paternal grandmother; Depression in her grandchild.  ROS:   Please see the history of present illness.     All other systems reviewed and are negative.  EKGs/Labs/Other Studies Reviewed:  The following studies were reviewed today:   EKG:  EKG is  ordered today.  The ekg ordered today demonstrates sinus bradycardia, heart rate 57.  Recent Labs: 11/11/2018: Magnesium 1.9 06/27/2019: ALT 11 07/10/2019: B Natriuretic Peptide 395.0; BUN 10; Creatinine, Ser 1.04; Hemoglobin 12.8; Platelets 377; Potassium 3.7; Sodium 137; TSH 1.424  Recent Lipid Panel    Component Value Date/Time   CHOL 220 (H) 06/27/2019 1128   TRIG 205 (H) 06/27/2019 1128   HDL 60 06/27/2019 1128   CHOLHDL 3.7 06/27/2019 1128   LDLCALC 124 (H) 06/27/2019 1128    Physical Exam:    VS:  BP 129/87 (BP Location: Left Arm, Patient Position: Sitting, Cuff Size: Normal)   Pulse (!) 56   Ht 5\' 4"  (1.626 m)   Wt 154 lb 6 oz (70 kg)   SpO2 99%   BMI 26.50 kg/m     Wt Readings from Last 3 Encounters:  07/14/19 154 lb 6 oz (70 kg)  07/10/19 155 lb (70.3 kg)  06/27/19 155 lb (70.3 kg)     GEN:  Well nourished, well developed in no acute distress HEENT: Normal NECK: No JVD; No carotid bruits LYMPHATICS: No lymphadenopathy CARDIAC: RRR, no murmurs, rubs, gallops RESPIRATORY:  Clear to auscultation without rales, wheezing or rhonchi  ABDOMEN: Soft, non-tender, non-distended MUSCULOSKELETAL:  No edema; No deformity  SKIN: Warm and dry NEUROLOGIC:  Alert and oriented x 3 PSYCHIATRIC:  Normal affect   ASSESSMENT:    1. Paroxysmal A-fib ([place])   2. Essential hypertension    PLAN:    In order of problems listed above:  1. Paroxysmal atrial fibrillation, currently in sinus rhythm.  CHA2DS2-VASc score 2.  Decrease Toprol-XL to 25 mg daily due to recent dizziness, fatigue and current heart rate in the 50s., continue Eliquis 5 mg twice  daily.  Echocardiogram showed normal systolic function, EF 55 to 60%, mild LA dilation.  May consider decreasing beta-blocker for other if symptoms of dizziness persist.   2. History of hypertension, blood pressure appears controlled.  Continue Toprol-XL as prescribed.   Follow-up in 3 months.  This note was generated in part or whole with voice recognition software. Voice recognition is usually quite accurate but there are transcription errors that can and very often do occur. I apologize for any typographical errors that were not detected and corrected.  Medication Adjustments/Labs and Tests Ordered: Current medicines are reviewed at length with the patient today.  Concerns regarding medicines are outlined above.  No orders of the defined types were placed in this encounter.  No orders of the defined types were placed in this encounter.   There are no Patient Instructions on file for this visit.   Signed, Kate Sable, MD  07/14/2019 10:57 AM    [place]

## 2019-07-24 ENCOUNTER — Ambulatory Visit: Payer: Medicare Other | Attending: Neurology

## 2019-07-24 DIAGNOSIS — G4733 Obstructive sleep apnea (adult) (pediatric): Secondary | ICD-10-CM | POA: Diagnosis not present

## 2019-07-24 DIAGNOSIS — F5101 Primary insomnia: Secondary | ICD-10-CM | POA: Diagnosis not present

## 2019-07-24 DIAGNOSIS — G4700 Insomnia, unspecified: Secondary | ICD-10-CM | POA: Diagnosis not present

## 2019-07-24 DIAGNOSIS — R0683 Snoring: Secondary | ICD-10-CM | POA: Diagnosis not present

## 2019-07-24 DIAGNOSIS — E7800 Pure hypercholesterolemia, unspecified: Secondary | ICD-10-CM | POA: Diagnosis not present

## 2019-07-25 ENCOUNTER — Other Ambulatory Visit: Payer: Self-pay

## 2019-08-12 ENCOUNTER — Telehealth: Payer: Self-pay

## 2019-08-12 NOTE — Telephone Encounter (Signed)
Copied from [place] [id number]. Topic: General - Call Back - No Documentation >> Aug 12, 2019 12:16 PM Erick Blinks wrote: Reason for CRM: Requesting a call back to discuss sleep apnea study results  Best contact: [phone]

## 2019-08-13 NOTE — Telephone Encounter (Signed)
Just signed form for CPAP titration study today.  Have not seen other results.

## 2019-08-13 NOTE — Telephone Encounter (Signed)
Pt advised.   Thanks,   -Eliane Hammersmith  

## 2019-08-19 NOTE — Progress Notes (Signed)
PROVIDER NOTE: Information contained herein reflects review and annotations entered in association with encounter. Interpretation of such information and data should be left to medically-trained personnel. Information provided to patient can be located elsewhere in the medical record under "Patient Instructions". Document created using STT-dictation technology, any transcriptional errors that may result from process are unintentional.    Patient: [name]  Service Category: E/M  Provider: Gaspar Cola, MD  DOB: June 04, 1960  DOS: 08/20/2019  Specialty: Interventional Pain Management  MRN: [id number]  Setting: Ambulatory outpatient  PCP: Virginia Crews, MD  Type: Established Patient    Referring Provider: Virginia Crews, MD  Location: Office  Delivery: Face-to-face     HPI  Reason for encounter: [name], a 34 y.o. year old female, is here today for evaluation and management of her Chronic pain syndrome [G89.4]. Ms. Delval primary complain today is Back Pain (low) Last encounter: Practice (Visit date not found). My last encounter with her was on Visit date not found. Pertinent problems: Ms. 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 Pain Assessment: Severity of Chronic pain is reported as a 3 /10. Location: Back Lower/radiates to hips and down the knees on the side. Onset: More than a month ago. Quality: Burning, Constant. Timing: Constant. Modifying factor(s): lying flat on back. Vitals:  height is _0  (1.626 m) and weight is 150 lb (68 kg). Her temperature is 97 F (36.1 C) (abnormal). Her blood pressure is 122/94 (abnormal) and her pulse is 66. Her respiration is 18 and oxygen saturation is 100%.    The patient indicates doing well with the current medication regimen. No adverse reactions or side effects reported to the medications.  The patient indicates that she is having more problems with pain between the shoulder blades that affects her after she has been doing the dishes for a while.  She indicates that it gets so bad that she will start crying.  In addition to this, she also complains of bilateral shoulder pain, bilateral neck pain, and weakness of the right upper extremity as well as numbness in the left upper extremity going all the way down to the pinky finger and ring fingers.  This symptoms would suggest cervical problems affecting the C5-C8 nerve roots.  Today I will be ordering some x-rays of the cervical spine to evaluate that area.  Pharmacotherapy Assessment   Analgesic: Oxycodone IR 10 mg, 1 tab PO q 6 hrs (40 mg/day of oxycodone) MME/day: 60 mg/day.   Monitoring: [place] PMP: PDMP reviewed during this encounter.  Pharmacotherapy: No side-effects or adverse reactions reported. Compliance: No problems identified. Effectiveness: Clinically acceptable.  UDS:  Summary  Date Value Ref Range Status  11/11/2018 Note  Final    Comment:    ==================================================================== [employer] Select 13 (MW) ==================================================================== Test                             Result       Flag       Units Drug Present and Declared for Prescription Verification   Alprazolam                     167          EXPECTED   ng/mg creat   Alpha-hydroxyalprazolam        564          EXPECTED   ng/mg creat    Source of alprazolam is a scheduled prescription medication. Alpha-    hydroxyalprazolam is an expected metabolite of alprazolam.   Oxycodone                      2512         EXPECTED   ng/mg creat   Oxymorphone                    1271         EXPECTED   ng/mg creat   Noroxycodone                   6433         EXPECTED   ng/mg creat   Noroxymorphone                 145          EXPECTED   ng/mg creat    Sources of oxycodone are scheduled prescription medications.    Oxymorphone, noroxycodone, and noroxymorphone are expected    metabolites of oxycodone. Oxymorphone is also available as a    scheduled prescription medication. ==================================================================== Test                      Result    Flag   Units      Ref Range   Creatinine              126              mg/dL      >=20 ==================================================================== Declared Medications:  The flagging and interpretation on this report are based on the  following declared medications.  Unexpected results may arise from  inaccuracies in the declared medications.  **Note: The testing scope of this panel includes these medications:  Alprazolam  Oxycodone  **Note: The testing scope of this panel does not include the  following reported  medications:  Albuterol  Bupropion (Wellbutrin)  Calcium  Dexlansoprazole (Dexilant)  Docusate  Duloxetine  Fluticasone  Magnesium (Mag-Ox)  Metoprolol  Montelukast  Ondansetron  Pregabalin  Salmeterol  Valacyclovir ==================================================================== For clinical consultation, please call [phone]. ====================================================================      ROS  Constitutional: Denies any fever or chills Gastrointestinal: No reported hemesis, hematochezia, vomiting, or acute GI distress Musculoskeletal: Denies any acute onset joint swelling, redness, loss of ROM, or weakness Neurological: No reported episodes of acute onset apraxia, aphasia, dysarthria, agnosia, amnesia, paralysis, loss of coordination, or loss of consciousness  Medication Review  ALPRAZolam, AeroChamber MV, Fluticasone-Salmeterol,  Oxycodone HCl, acetaminophen, albuterol, apixaban, calcium carbonate, cyclobenzaprine, furosemide, lubiprostone, metoprolol succinate, montelukast, omeprazole, ondansetron, pantoprazole, and valACYclovir  History Review  Allergy: Ms. Proctor is allergic to amoxicillin, chlorhexidine gluconate, clindamycin, codeine, erythromycin, penicillin g, sulfa antibiotics, levofloxacin, shellfish allergy, decadron [dexamethasone], mangifera indica, papaya derivatives, betadine [povidone iodine], chlorhexidine, clarithromycin, clindamycin/lincomycin, other, povidone-iodine, and prednisone. Drug: Ms. Baldo  reports no history of drug use. Alcohol:  reports no history of alcohol use. Tobacco:  reports that she has never smoked. She has never used smokeless tobacco. Social: Ms. Krupka  reports that she has never smoked. She has never used smokeless tobacco. She reports that she does not drink alcohol and does not use drugs. Medical:  has a past medical history of A-fib ([place]) (2021), Abnormal EKG, Acute postoperative pain (01/04/2017), Addison anemia  (08/15/2004), Anemia, Anxiety, Asthma, Cephalalgia (08/18/2014), Cervical disc disease (08/18/2014), Chronic headaches, DDD (degenerative disc disease), Depression, Dissociative disorder, Dizziness (04/22/2013), Duodenal ulcer with hemorrhage and perforation ([place]) (04/27/2003), GERD (gastroesophageal reflux disease), H/O arthrodesis (08/18/2014), Headache(784.0), History of blood transfusion, History of cervical spinal surgery (01/04/2015), History of kidney stones, Hypertension, Inverted T wave, Narrowing of intervertebral disc space (08/18/2014), Orthostatic hypotension (04/22/2013), Pain, Pneumonia, PONV (postoperative nausea and vomiting), Postop Hyponatremia (05/14/2012), Postoperative anemia due to acute blood loss (05/14/2012), PTSD (post-traumatic stress disorder), Right foot drop, Right hip arthralgia (08/18/2014), and Sleep apnea (2021). Surgical: Ms. Crow  has a past surgical history that includes RIGHT HIP ARTHROSCOPY FOR LABRAL TEAR (ABOUT 2010); Anterior fusion cervical spine (MAY 2012); Nasal septum surgery (MARCH 2013); Back surgery (2009); Tonsillectomy; Abdominal hysterectomy; DIAGNOSTIC LAPAROSCOPIES - MULTIPLE FOR ENDOMETRIOSIS; Cholecystectomy; Hip arthroscopy (09/20/2011); Shoulder arthroscopy (05-06-12); Carpal tunnel release (05-06-12); Total hip arthroplasty (Right, 05/13/2012); Anterior cervical decomp/discectomy fusion (N/A, 10/30/2012); Posterior cervical fusion/foraminotomy (N/A, 04/16/2013); Breast reduction surgery (Bilateral, 06/2016); Colonoscopy with propofol (N/A, 01/26/2017); Esophagogastroduodenoscopy (egd) with propofol (N/A, 01/26/2017); Esophageal dilation (N/A, 01/26/2017); polypectomy (N/A, 01/26/2017); PH impedance study (N/A, 05/30/2017); Esophageal manometry (N/A, 05/30/2017); Radiofrequency ablation nerves; Reduction mammaplasty (Bilateral, 05/2016); Breast biopsy (Right, 2008); Breast excisional biopsy (Left, 1998); afib (05/2019); and LEFT HEART CATH AND CORONARY ANGIOGRAPHY (Left,  06/10/2019). Family: family history includes Aneurysm in her maternal grandmother and mother; Anxiety disorder in her grandchild; Bipolar disorder in her grandchild and sister; Breast cancer (age of onset: 41) in her paternal grandmother; Depression in her grandchild.  Laboratory Chemistry Profile   Renal Lab Results  Component Value Date   BUN 10 07/10/2019   CREATININE 1.04 (H) 07/10/2019   BCR 7 (L) 11/11/2018   GFRAA >60 07/10/2019   GFRNONAA 58 (L) 07/10/2019     Hepatic Lab Results  Component Value Date   AST 22 06/27/2019   ALT 11 06/27/2019   ALBUMIN 4.6 06/27/2019   ALKPHOS 148 (H) 06/27/2019   LIPASE 23 04/06/2015     Electrolytes Lab Results  Component Value Date   NA 137 07/10/2019   K 3.7 07/10/2019   CL 103 07/10/2019   CALCIUM 9.3 07/10/2019   MG 1.9 11/11/2018     Bone Lab Results  Component Value Date   25OHVITD1 23 (L) 11/11/2018   25OHVITD2 9.8 11/11/2018   25OHVITD3 13 11/11/2018     Inflammation (CRP: Acute Phase) (ESR: Chronic Phase) Lab Results  Component Value Date   CRP 12 (H) 11/11/2018   ESRSEDRATE 42 (H) 11/11/2018       Note: Above Lab results reviewed.  Recent Imaging Review  DG Cervical Spine With Flex & Extend CLINICAL DATA:  Neck pain radiating  into the shoulder blades. Cervical radiculitis.  EXAM: CERVICAL SPINE COMPLETE WITH FLEXION AND EXTENSION VIEWS  COMPARISON:  04/17/2013  FINDINGS: Again noted is a surgical fusion at C4 through C6. Anterior surgical plate with screws at C4 and C6 with corpectomy changes at C5. Posterior screws and rods on the right side at C4 and C6 and posterior screw and rod on the left side at C5-C6. Surgical hardware is stable from the previous examination. Alignment of the cervical spine is unchanged. Prevertebral soft tissues are within normal limits. Negative for acute fracture. Lung apices are clear. Bony encroachment of the right neural foramen at C3-C4.  IMPRESSION: Stable  surgical fusion at C4 through C6. Stable appearance of surgical hardware.  No acute findings.  Bony encroachment of the right neural foramen at C3-C4.  Electronically Signed   By: Markus Daft M.D.   On: 08/21/2019 09:22 Note: Reviewed        Physical Exam  General appearance: Well nourished, well developed, and well hydrated. In no apparent acute distress Mental status: Alert, oriented x 3 (person, place, & time)       Respiratory: No evidence of acute respiratory distress Eyes: PERLA Vitals: BP (!) 122/94   Pulse 66   Temp (!) 97 F (36.1 C)   Resp 18   Ht _0  (1.626 m)   Wt 150 lb (68 kg)   SpO2 100%   BMI 25.75 kg/m  BMI: Estimated body mass index is 25.75 kg/m as calculated from the following:   Height as of this encounter: _1  (1.626 m).   Weight as of this encounter: 150 lb (68 kg). Ideal: Ideal body weight: 54.7 kg (120 lb 9.5 oz) Adjusted ideal body weight: 60 kg (132 lb 5.7 oz)  Assessment   Status Diagnosis  Controlled Controlled Controlled 1. Chronic pain syndrome   2. Chronic low back pain (Primary area of Pain) (Bilateral) (L>R) w/o sciatica   3. Chronic hip pain (Secondary Area of Pain) (Bilateral) (L>R)   4. Pharmacologic therapy   5. Cervical spondylosis   6. Chronic shoulder radicular pain (Bilateral) (L>R)   7. Cervical radiculitis (C4,C5,C8) (Left)      Updated Problems: No problems updated.  Plan of Care  Problem-specific:  No problem-specific Assessment & Plan notes found for this encounter.  Ms. SAIMA MONTERROSO has a current medication list which includes the following long-term medication(s): albuterol, apixaban, furosemide, metoprolol succinate, montelukast, oxycodone hcl, [START ON 09/23/2019] oxycodone hcl, [START ON 10/23/2019] oxycodone hcl, and pantoprazole.  Pharmacotherapy (Medications Ordered): Meds ordered this encounter  Medications  . Oxycodone HCl 10 MG TABS    Sig: Take 1 tablet (10 mg total) by mouth every 6 (six)  hours as needed. Must last 30 days    Dispense:  120 tablet    Refill:  0    Chronic Pain: STOP Act (Not applicable) Fill 1 day early if closed on refill date. Do not fill until: 08/24/2019. To last until: 09/23/2019. Avoid benzodiazepines within 8 hours of opioids  . Oxycodone HCl 10 MG TABS    Sig: Take 1 tablet (10 mg total) by mouth every 6 (six) hours as needed. Must last 30 days    Dispense:  120 tablet    Refill:  0    Chronic Pain: STOP Act (Not applicable) Fill 1 day early if closed on refill date. Do not fill until: 09/23/2019. To last until: 10/23/2019. Avoid benzodiazepines within 8 hours of opioids  . Oxycodone HCl 10  MG TABS    Sig: Take 1 tablet (10 mg total) by mouth every 6 (six) hours as needed. Must last 30 days    Dispense:  120 tablet    Refill:  0    Chronic Pain: STOP Act (Not applicable) Fill 1 day early if closed on refill date. Do not fill until: 10/23/2019. To last until: 11/22/2019. Avoid benzodiazepines within 8 hours of opioids   Orders:  Orders Placed This Encounter  Procedures  . DG Cervical Spine With Flex & Extend    Patient presents with axial pain with possible radicular component.  In addition to any acute findings, please report on:  1. Facet (Zygapophyseal) joint DJD (Hypertrophy, space narrowing, subchondral sclerosis, and/or osteophyte formation) 2. DDD and/or IVDD (Loss of disc height, desiccation or "Black disc disease") 3. Pars defects 4. Spondylolisthesis, spondylosis, and/or spondyloarthropathies (include Degree/Grade of displacement in mm) 5. Vertebral body Fractures, including age (old, new/acute) 75. Modic Type Changes 7. Demineralization 8. Bone pathology 9. Central, Lateral Recess, and/or Foraminal Stenosis (include AP diameter of stenosis in mm) 10. Surgical changes (hardware type, status, and presence of fibrosis) NOTE: Please specify level(s) and laterality. If applicable: Please indicate ROM and/or evidence of instability (>56m  displacement between flexion and extension views)    Standing Status:   Future    Number of Occurrences:   1    Standing Expiration Date:   11/20/2019    Scheduling Instructions:     Please evaluate for any evidence of cervical spine instability. Describe the presence of any spondylolisthesis (Antero- or retrolisthesis). If present, provide displacement "Grade" and measurement in cm. Please describe presence and specific location (Level & Laterality) of any signs of      osteoarthritis, zygapophyseal (Facet) joints DJD (including decreased joint space and/or osteophytosis), DDD, Foraminal narrowing, as well as any sclerosis and/or cyst formation. Please comment on ROM.    Order Specific Question:   Reason for Exam (SYMPTOM  OR DIAGNOSIS REQUIRED)    Answer:   Cervicalgia    Order Specific Question:   Is patient pregnant?    Answer:   No    Order Specific Question:   Preferred imaging location?    Answer:   [facility]    Order Specific Question:   Call Results- Best Contact Number?    Answer:   [phone][facility]    Order Specific Question:   Radiology Contrast Protocol - do NOT remove file path    Answer:   [email or web address]  . [employer] Select 13 (MW), Urine    Volume: 30 ml(s). Minimum 3 ml of urine is needed. Document temperature of fresh sample. Indications: Long term (current) use of opiate analgesic ((A57.903    Order Specific Question:   Release to patient    Answer:   Immediate   Follow-up plan:   Return in about 13 weeks (around 11/19/2019) for (F2F), (MM) review of cervical x-rays, in addition, Procedure (w/ sedation): (L) CESI.      Interventional management options: Considering:   NOTE:  1. SHELLFISH ALLERGY; Dexamethasone & Prednisone allergy. 2. NOT a candidate for lumbar facet RFA (due to hardware). 3. NOT a candidate for membrane stabilizers (Neurontin/Lyrica) due to prior failed trials. 4. NOT a candidate for  SCS (due to hardware). 5. NOT a candidate for IT Pump (patient's choice due to surgical risks).  Possiblerightsuprascapular nerve RFA #1   Palliative PRN treatment(s):   Diagnostic bilateral lumbar facet block #2 (No benefit in doing injection w/o  steroids.) Palliativebilateral suprascapular NB #3(No Steroids) Palliative leftsuprascapular nerve RFA #2(last done on 01/04/2017)      Recent Visits Date Type Provider Dept  08/20/19 Office Visit Milinda Pointer, MD [facility]  Showing recent visits within past 90 days and meeting all other requirements Future Appointments Date Type Provider Dept  08/26/19 Office Visit Milinda Pointer, MD [facility]  11/19/19 Appointment Milinda Pointer, MD [facility]  Showing future appointments within next 90 days and meeting all other requirements  I discussed the assessment and treatment plan with the patient. The patient was provided an opportunity to ask questions and all were answered. The patient agreed with the plan and demonstrated an understanding of the instructions.  Patient advised to call back or seek an in-person evaluation if the symptoms or condition worsens.  Duration of encounter: 30 minutes.  Note by: Gaspar Cola, MD Date: 08/20/2019; Time: 4:42 PM

## 2019-08-20 ENCOUNTER — Other Ambulatory Visit: Payer: Self-pay

## 2019-08-20 ENCOUNTER — Ambulatory Visit: Payer: Medicare Other | Admitting: Pain Medicine

## 2019-08-20 ENCOUNTER — Ambulatory Visit
Admission: RE | Admit: 2019-08-20 | Discharge: 2019-08-20 | Disposition: A | Discharge status: Home / Self Care | Payer: Medicare Other | Source: Ambulatory Visit | Attending: Pain Medicine | Admitting: Pain Medicine

## 2019-08-20 ENCOUNTER — Encounter: Payer: Self-pay | Admitting: Pain Medicine

## 2019-08-20 VITALS — BP 122/94 | HR 66 | Temp 97.0°F | Resp 18 | Ht 64.0 in | Wt 150.0 lb

## 2019-08-20 DIAGNOSIS — M5412 Radiculopathy, cervical region: Secondary | ICD-10-CM

## 2019-08-20 DIAGNOSIS — Z79899 Other long term (current) drug therapy: Secondary | ICD-10-CM | POA: Diagnosis not present

## 2019-08-20 DIAGNOSIS — M25551 Pain in right hip: Secondary | ICD-10-CM | POA: Diagnosis not present

## 2019-08-20 DIAGNOSIS — M4322 Fusion of spine, cervical region: Secondary | ICD-10-CM | POA: Diagnosis not present

## 2019-08-20 DIAGNOSIS — G894 Chronic pain syndrome: Secondary | ICD-10-CM | POA: Diagnosis not present

## 2019-08-20 DIAGNOSIS — M47812 Spondylosis without myelopathy or radiculopathy, cervical region: Secondary | ICD-10-CM | POA: Insufficient documentation

## 2019-08-20 DIAGNOSIS — M545 Low back pain, unspecified: Secondary | ICD-10-CM

## 2019-08-20 DIAGNOSIS — G8929 Other chronic pain: Secondary | ICD-10-CM

## 2019-08-20 DIAGNOSIS — M25552 Pain in left hip: Secondary | ICD-10-CM

## 2019-08-20 MED ORDER — OXYCODONE HCL 10 MG PO TABS: 10.0000 mg | ORAL_TABLET | Freq: Four times a day (QID) | ORAL | 0 refills | Status: DC | PRN

## 2019-08-20 NOTE — Patient Instructions (Addendum)
____________________________________________________________________________________________  Preparing for Procedure with Sedation  Procedure appointments are limited to planned procedures: . No Prescription Refills. . No disability issues will be discussed. . No medication changes will be discussed.  Instructions: . Oral Intake: Do not eat or drink anything for at least 8 hours prior to your procedure. (Exception: Blood Pressure Medication. See below.) . Transportation: Unless otherwise stated by your physician, you may drive yourself after the procedure. . Blood Pressure Medicine: Do not forget to take your blood pressure medicine with a sip of water the morning of the procedure. If your Diastolic (lower reading)is above 100 mmHg, elective cases will be cancelled/rescheduled. . Blood thinners: These will need to be stopped for procedures. Notify our staff if you are taking any blood thinners. Depending on which one you take, there will be specific instructions on how and when to stop it. . Diabetics on insulin: Notify the staff so that you can be scheduled 1st case in the morning. If your diabetes requires high dose insulin, take only  of your normal insulin dose the morning of the procedure and notify the staff that you have done so. . Preventing infections: Shower with an antibacterial soap the morning of your procedure. . Build-up your immune system: Take 1000 mg of Vitamin C with every meal (3 times a day) the day prior to your procedure. . Antibiotics: Inform the staff if you have a condition or reason that requires you to take antibiotics before dental procedures. . Pregnancy: If you are pregnant, call and cancel the procedure. . Sickness: If you have a cold, fever, or any active infections, call and cancel the procedure. . Arrival: You must be in the facility at least 30 minutes prior to your scheduled procedure. . Children: Do not bring children with you. . Dress appropriately:  Bring dark clothing that you would not mind if they get stained. . Valuables: Do not bring any jewelry or valuables.  Reasons to call and reschedule or cancel your procedure: (Following these recommendations will minimize the risk of a serious complication.) . Surgeries: Avoid having procedures within 2 weeks of any surgery. (Avoid for 2 weeks before or after any surgery). . Flu Shots: Avoid having procedures within 2 weeks of a flu shots or . (Avoid for 2 weeks before or after immunizations). . Barium: Avoid having a procedure within 7-10 days after having had a radiological study involving the use of radiological contrast. (Myelograms, Barium swallow or enema study). . Heart attacks: Avoid any elective procedures or surgeries for the initial 6 months after a "Myocardial Infarction" (Heart Attack). . Blood thinners: It is imperative that you stop these medications before procedures. Let us know if you if you take any blood thinner.  . Infection: Avoid procedures during or within two weeks of an infection (including chest colds or gastrointestinal problems). Symptoms associated with infections include: Localized redness, fever, chills, night sweats or profuse sweating, burning sensation when voiding, cough, congestion, stuffiness, runny nose, sore throat, diarrhea, nausea, vomiting, cold or Flu symptoms, recent or current infections. It is specially important if the infection is over the area that we intend to treat. . Heart and lung problems: Symptoms that may suggest an active cardiopulmonary problem include: cough, chest pain, breathing difficulties or shortness of breath, dizziness, ankle swelling, uncontrolled high or unusually low blood pressure, and/or palpitations. If you are experiencing any of these symptoms, cancel your procedure and contact your primary care physician for an evaluation.  Remember:  Regular Business hours are:    Monday to Thursday 8:00 AM to 4:00 PM  Provider's  Schedule: Milinda Pointer, MD:  Procedure days: Tuesday and Thursday 7:30 AM to 4:00 PM  Gillis Santa, MD:  Procedure days: Monday and Wednesday 7:30 AM to 4:00 PM ____________________________________________________________________________________________   ____________________________________________________________________________________________  Medication Rules  Purpose: To inform patients, and their family members, of our rules and regulations.  Applies to: All patients receiving prescriptions (written or electronic).  Pharmacy of record: Pharmacy where electronic prescriptions will be sent. If written prescriptions are taken to a different pharmacy, please inform the nursing staff. The pharmacy listed in the electronic medical record should be the one where you would like electronic prescriptions to be sent.  Electronic prescriptions: In compliance with the [place] (STOP) Act of 2017 (Session Lanny Cramp [phone]), effective March 13, 2018, all controlled substances must be electronically prescribed. Calling prescriptions to the pharmacy will cease to exist.  Prescription refills: Only during scheduled appointments. Applies to all prescriptions.  NOTE: The following applies primarily to controlled substances (Opioid* Pain Medications).   Type of encounter (visit): For patients receiving controlled substances, face-to-face visits are required. (Not an option or up to the patient.)  Patient's responsibilities: 1. Pain Pills: Bring all pain pills to every appointment (except for procedure appointments). 2. Pill Bottles: Bring pills in original pharmacy bottle. Always bring the newest bottle. Bring bottle, even if empty. 3. Medication refills: You are responsible for knowing and keeping track of what medications you take and those you need refilled. The day before your appointment: write a list of all prescriptions that need to be  refilled. The day of the appointment: give the list to the admitting nurse. Prescriptions will be written only during appointments. No prescriptions will be written on procedure days. If you forget a medication: it will not be "Called in", "Faxed", or "electronically sent". You will need to get another appointment to get these prescribed. No early refills. Do not call asking to have your prescription filled early. 4. Prescription Accuracy: You are responsible for carefully inspecting your prescriptions before leaving our office. Have the discharge nurse carefully go over each prescription with you, before taking them home. Make sure that your name is accurately spelled, that your address is correct. Check the name and dose of your medication to make sure it is accurate. Check the number of pills, and the written instructions to make sure they are clear and accurate. Make sure that you are given enough medication to last until your next medication refill appointment. 5. Taking Medication: Take medication as prescribed. When it comes to controlled substances, taking less pills or less frequently than prescribed is permitted and encouraged. Never take more pills than instructed. Never take medication more frequently than prescribed.  6. Inform other Doctors: Always inform, all of your healthcare providers, of all the medications you take. 7. Pain Medication from other Providers: You are not allowed to accept any additional pain medication from any other Doctor or Healthcare provider. There are two exceptions to this rule. (see below) In the event that you require additional pain medication, you are responsible for notifying us, as stated below. 8. Medication Agreement: You are responsible for carefully reading and following our Medication Agreement. This must be signed before receiving any prescriptions from our practice. Safely store a copy of your signed Agreement. Violations to the Agreement will result in  no further prescriptions. (Additional copies of our Medication Agreement are available upon request.) 9. Laws, Rules, & Regulations: All  patients are expected to follow all Federal and [employer], Rules, [employer]. Ignorance of the Laws does not constitute a valid excuse.  10. Illegal drugs and Controlled Substances: The use of illegal substances (including, but not limited to marijuana and its derivatives) and/or the illegal use of any controlled substances is strictly prohibited. Violation of this rule may result in the immediate and permanent discontinuation of any and all prescriptions being written by our practice. The use of any illegal substances is prohibited. 11. Adopted CDC guidelines & recommendations: Target dosing levels will be at or below 60 MME/day. Use of benzodiazepines** is not recommended.  Exceptions: There are only two exceptions to the rule of not receiving pain medications from other Healthcare Providers. 1. Exception #1 (Emergencies): In the event of an emergency (i.e.: accident requiring emergency care), you are allowed to receive additional pain medication. However, you are responsible for: As soon as you are able, call our office [phone], at any time of the day or night, and leave a message stating your name, the date and nature of the emergency, and the name and dose of the medication prescribed. In the event that your call is answered by a member of our staff, make sure to document and save the date, time, and the name of the person that took your information.  2. Exception #2 (Planned Surgery): In the event that you are scheduled by another doctor or dentist to have any type of surgery or procedure, you are allowed (for a period no longer than 30 days), to receive additional pain medication, for the acute post-op pain. However, in this case, you are responsible for picking up a copy of our "Post-op Pain Management for Surgeons" handout, and giving it to your  surgeon or dentist. This document is available at our office, and does not require an appointment to obtain it. Simply go to our office during business hours (Monday-Thursday from 8:00 AM to 4:00 PM) (Friday 8:00 AM to 12:00 Noon) or if you have a scheduled appointment with [place], prior to your surgery, and ask for it by name. In addition, you will need to provide [place] with your name, name of your surgeon, type of surgery, and date of procedure or surgery.  *Opioid medications include: morphine, codeine, oxycodone, oxymorphone, hydrocodone, hydromorphone, meperidine, tramadol, tapentadol, buprenorphine, fentanyl, methadone. **Benzodiazepine medications include: diazepam (Valium), alprazolam (Xanax), clonazepam (Klonopine), lorazepam (Ativan), clorazepate (Tranxene), chlordiazepoxide (Librium), estazolam (Prosom), oxazepam (Serax), temazepam (Restoril), triazolam (Halcion) (Last updated: 05/10/2017) ____________________________________________________________________________________________   ____________________________________________________________________________________________  Medication Recommendations and Reminders  Applies to: All patients receiving prescriptions (written and/or electronic).  Medication Rules & Regulations: These rules and regulations exist for your safety and that of others. They are not flexible and neither are we. Dismissing or ignoring them will be considered "non-compliance" with medication therapy, resulting in complete and irreversible termination of such therapy. (See document titled "Medication Rules" for more details.) In all conscience, because of safety reasons, we cannot continue providing a therapy where the patient does not follow instructions.  Pharmacy of record:   Definition: This is the pharmacy where your electronic prescriptions will be sent.   We do not endorse any particular pharmacy.  You are not restricted in your choice of pharmacy.  The  pharmacy listed in the electronic medical record should be the one where you want electronic prescriptions to be sent.  If you choose to change pharmacy, simply notify our nursing staff of your choice of new pharmacy.  Recommendations:  Keep all of your pain medications in a safe place, under lock and key, even if you live alone.   After you fill your prescription, take 1 week's worth of pills and put them away in a safe place. You should keep a separate, properly labeled bottle for this purpose. The remainder should be kept in the original bottle. Use this as your primary supply, until it runs out. Once it's gone, then you know that you have 1 week's worth of medicine, and it is time to come in for a prescription refill. If you do this correctly, it is unlikely that you will ever run out of medicine.  To make sure that the above recommendation works, it is very important that you make sure your medication refill appointments are scheduled at least 1 week before you run out of medicine. To do this in an effective manner, make sure that you do not leave the office without scheduling your next medication management appointment. Always ask the nursing staff to show you in your prescription , when your medication will be running out. Then arrange for the receptionist to get you a return appointment, at least 7 days before you run out of medicine. Do not wait until you have 1 or 2 pills left, to come in. This is very poor planning and does not take into consideration that we may need to cancel appointments due to bad weather, sickness, or emergencies affecting our staff.  "Partial Fill": If for any reason your pharmacy does not have enough pills/tablets to completely fill or refill your prescription, do not allow for a "partial fill". You will need a separate prescription to fill the remaining amount, which we will not provide. If the reason for the partial fill is your insurance, you will need to talk to the  pharmacist about payment alternatives for the remaining tablets, but again, do not accept a partial fill.  Prescription refills and/or changes in medication(s):   Prescription refills, and/or changes in dose or medication, will be conducted only during scheduled medication management appointments. (Applies to both, written and electronic prescriptions.)  No refills on procedure days. No medication will be changed or started on procedure days. No changes, adjustments, and/or refills will be conducted on a procedure day. Doing so will interfere with the diagnostic portion of the procedure.  No phone refills. No medications will be "called into the pharmacy".  No Fax refills.  No weekend refills.  No Holliday refills.  No after hours refills.  Remember:  Business hours are:  Monday to Thursday 8:00 AM to 4:00 PM Provider's Schedule: Milinda Pointer, MD - Appointments are:  Medication management: Monday and Wednesday 8:00 AM to 4:00 PM Procedure day: Tuesday and Thursday 7:30 AM to 4:00 PM Gillis Santa, MD - Appointments are:  Medication management: Tuesday and Thursday 8:00 AM to 4:00 PM Procedure day: Monday and Wednesday 7:30 AM to 4:00 PM (Last update: 05/10/2017) ____________________________________________________________________________________________   GENERAL RISKS AND COMPLICATIONS  What are the risk, side effects and possible complications? Generally speaking, most procedures are safe.  However, with any procedure there are risks, side effects, and the possibility of complications.  The risks and complications are dependent upon the sites that are lesioned, or the type of nerve block to be performed.  The closer the procedure is to the spine, the more serious the risks are.  Great care is taken when placing the radio frequency needles, block needles or lesioning probes, but sometimes complications can occur. 1. Infection:  Any time there is an injection through the skin,  there is a risk of infection.  This is why sterile conditions are used for these blocks.  There are four possible types of infection. 1. Localized skin infection. 2. Central Nervous System Infection-This can be in the form of Meningitis, which can be deadly. 3. Epidural Infections-This can be in the form of an epidural abscess, which can cause pressure inside of the spine, causing compression of the spinal cord with subsequent paralysis. This would require an emergency surgery to decompress, and there are no guarantees that the patient would recover from the paralysis. 4. Discitis-This is an infection of the intervertebral discs.  It occurs in about 1% of discography procedures.  It is difficult to treat and it may lead to surgery.        2. Pain: the needles have to go through skin and soft tissues, will cause soreness.       3. Damage to internal structures:  The nerves to be lesioned may be near blood vessels or    other nerves which can be potentially damaged.       4. Bleeding: Bleeding is more common if the patient is taking blood thinners such as  aspirin, Coumadin, Ticiid, Plavix, etc., or if he/she have some genetic predisposition  such as hemophilia. Bleeding into the spinal canal can cause compression of the spinal  cord with subsequent paralysis.  This would require an emergency surgery to  decompress and there are no guarantees that the patient would recover from the  paralysis.       5. Pneumothorax:  Puncturing of a lung is a possibility, every time a needle is introduced in  the area of the chest or upper back.  Pneumothorax refers to free air around the  collapsed lung(s), inside of the thoracic cavity (chest cavity).  Another two possible  complications related to a similar event would include: Hemothorax and Chylothorax.   These are variations of the Pneumothorax, where instead of air around the collapsed  lung(s), you may have blood or chyle, respectively.       6. Spinal headaches:  They may occur with any procedures in the area of the spine.       7. Persistent CSF (Cerebro-Spinal Fluid) leakage: This is a rare problem, but may occur  with prolonged intrathecal or epidural catheters either due to the formation of a fistulous  track or a dural tear.       8. Nerve damage: By working so close to the spinal cord, there is always a possibility of  nerve damage, which could be as serious as a permanent spinal cord injury with  paralysis.       9. Death:  Although rare, severe deadly allergic reactions known as "Anaphylactic  reaction" can occur to any of the medications used.      10. Worsening of the symptoms:  We can always make thing worse.  What are the chances of something like this happening? Chances of any of this occuring are extremely low.  By statistics, you have more of a chance of getting killed in a motor vehicle accident: while driving to the hospital than any of the above occurring .  Nevertheless, you should be aware that they are possibilities.  In general, it is similar to taking a shower.  Everybody knows that you can slip, hit your head and get killed.  Does that mean that you should not shower again?  Nevertheless always  keep in mind that statistics do not mean anything if you happen to be on the wrong side of them.  Even if a procedure has a 1 (one) in a 1,000,000 (million) chance of going wrong, it you happen to be that one..Also, keep in mind that by statistics, you have more of a chance of having something go wrong when taking medications.  Who should not have this procedure? If you are on a blood thinning medication (e.g. Coumadin, Plavix, see list of "Blood Thinners"), or if you have an active infection going on, you should not have the procedure.  If you are taking any blood thinners, please inform your physician.  How should I prepare for this procedure?  Do not eat or drink anything at least six hours prior to the procedure.  Bring a driver with you .   It cannot be a taxi.  Come accompanied by an adult that can drive you back, and that is strong enough to help you if your legs get weak or numb from the local anesthetic.  Take all of your medicines the morning of the procedure with just enough water to swallow them.  If you have diabetes, make sure that you are scheduled to have your procedure done first thing in the morning, whenever possible.  If you have diabetes, take only half of your insulin dose and notify our nurse that you have done so as soon as you arrive at the clinic.  If you are diabetic, but only take blood sugar pills (oral hypoglycemic), then do not take them on the morning of your procedure.  You may take them after you have had the procedure.  Do not take aspirin or any aspirin-containing medications, at least eleven (11) days prior to the procedure.  They may prolong bleeding.  Wear loose fitting clothing that may be easy to take off and that you would not mind if it got stained with Betadine or blood.  Do not wear any jewelry or perfume  Remove any nail coloring.  It will interfere with some of our monitoring equipment.  NOTE: Remember that this is not meant to be interpreted as a complete list of all possible complications.  Unforeseen problems may occur.  BLOOD THINNERS The following drugs contain aspirin or other products, which can cause increased bleeding during surgery and should not be taken for 2 weeks prior to and 1 week after surgery.  If you should need take something for relief of minor pain, you may take acetaminophen which is found in Tylenol,m Datril, Anacin-3 and Panadol. It is not blood thinner. The products listed below are.  Do not take any of the products listed below in addition to any listed on your instruction sheet.  A.P.C or A.P.C with Codeine Codeine Phosphate Capsules #3 Ibuprofen Ridaura  ABC compound Congesprin Imuran rimadil  Advil Cope Indocin Robaxisal  Alka-Seltzer Effervescent Pain  Reliever and Antacid Coricidin or Coricidin-D  Indomethacin Rufen  Alka-Seltzer plus Cold Medicine Cosprin Ketoprofen S-A-C Tablets  Anacin Analgesic Tablets or Capsules Coumadin Korlgesic Salflex  Anacin Extra Strength Analgesic tablets or capsules CP-2 Tablets Lanoril Salicylate  Anaprox Cuprimine Capsules Levenox Salocol  Anexsia-D Dalteparin Magan Salsalate  Anodynos Darvon compound Magnesium Salicylate Sine-off  Ansaid Dasin Capsules Magsal Sodium Salicylate  Anturane Depen Capsules Marnal Soma  APF Arthritis pain formula Dewitt's Pills Measurin Stanback  Argesic Dia-Gesic Meclofenamic Sulfinpyrazone  Arthritis Bayer Timed Release Aspirin Diclofenac Meclomen Sulindac  Arthritis pain formula Anacin Dicumarol Medipren Supac  Analgesic (  Safety coated) Arthralgen Diffunasal Mefanamic Suprofen  Arthritis Strength Bufferin Dihydrocodeine Mepro Compound Suprol  Arthropan liquid Dopirydamole Methcarbomol with Aspirin Synalgos  ASA tablets/Enseals Disalcid Micrainin Tagament  Ascriptin Doan's Midol Talwin  Ascriptin A/D Dolene Mobidin Tanderil  Ascriptin Extra Strength Dolobid Moblgesic Ticlid  Ascriptin with Codeine Doloprin or Doloprin with Codeine Momentum Tolectin  Asperbuf Duoprin Mono-gesic Trendar  Aspergum Duradyne Motrin or Motrin IB Triminicin  Aspirin plain, buffered or enteric coated Durasal Myochrisine Trigesic  Aspirin Suppositories Easprin Nalfon Trillsate  Aspirin with Codeine Ecotrin Regular or Extra Strength Naprosyn Uracel  Atromid-S Efficin Naproxen Ursinus  Auranofin Capsules Elmiron Neocylate Vanquish  Axotal Emagrin Norgesic Verin  Azathioprine Empirin or Empirin with Codeine Normiflo Vitamin E  Azolid Emprazil Nuprin Voltaren  Bayer Aspirin plain, buffered or children's or timed BC Tablets or powders Encaprin Orgaran Warfarin Sodium  Buff-a-Comp Enoxaparin Orudis Zorpin  Buff-a-Comp with Codeine Equegesic Os-Cal-Gesic   Buffaprin Excedrin plain, buffered or  Extra Strength Oxalid   Bufferin Arthritis Strength Feldene Oxphenbutazone   Bufferin plain or Extra Strength Feldene Capsules Oxycodone with Aspirin   Bufferin with Codeine Fenoprofen Fenoprofen Pabalate or Pabalate-SF   Buffets II Flogesic Panagesic   Buffinol plain or Extra Strength Florinal or Florinal with Codeine Panwarfarin   Buf-Tabs Flurbiprofen Penicillamine   Butalbital Compound Four-way cold tablets Penicillin   Butazolidin Fragmin Pepto-Bismol   Carbenicillin Geminisyn Percodan   Carna Arthritis Reliever Geopen Persantine   Carprofen Gold's salt Persistin   Chloramphenicol Goody's Phenylbutazone   Chloromycetin Haltrain Piroxlcam   Clmetidine heparin Plaquenil   Cllnoril Hyco-pap Ponstel   Clofibrate Hydroxy chloroquine Propoxyphen         Before stopping any of these medications, be sure to consult the physician who ordered them.  Some, such as Coumadin (Warfarin) are ordered to prevent or treat serious conditions such as "deep thrombosis", "pumonary embolisms", and other heart problems.  The amount of time that you may need off of the medication may also vary with the medication and the reason for which you were taking it.  If you are taking any of these medications, please make sure you notify your pain physician before you undergo any procedures.         Epidural Steroid Injection Patient Information  Description: The epidural space surrounds the nerves as they exit the spinal cord.  In some patients, the nerves can be compressed and inflamed by a bulging disc or a tight spinal canal (spinal stenosis).  By injecting steroids into the epidural space, we can bring irritated nerves into direct contact with a potentially helpful medication.  These steroids act directly on the irritated nerves and can reduce swelling and inflammation which often leads to decreased pain.  Epidural steroids may be injected anywhere along the spine and from the neck to the low back depending  upon the location of your pain.   After numbing the skin with local anesthetic (like Novocaine), a small needle is passed into the epidural space slowly.  You may experience a sensation of pressure while this is being done.  The entire block usually last less than 10 minutes.  Conditions which may be treated by epidural steroids:   Low back and leg pain  Neck and arm pain  Spinal stenosis  Post-laminectomy syndrome  Herpes zoster (shingles) pain  Pain from compression fractures  Preparation for the injection:  1. Do not eat any solid food or dairy products within 8 hours of your appointment.  2. You may drink clear  liquids up to 3 hours before appointment.  Clear liquids include water, black coffee, juice or soda.  No milk or cream please. 3. You may take your regular medication, including pain medications, with a sip of water before your appointment  Diabetics should hold regular insulin (if taken separately) and take 1/2 normal NPH dos the morning of the procedure.  Carry some sugar containing items with you to your appointment. 4. A driver must accompany you and be prepared to drive you home after your procedure.  5. Bring all your current medications with your. 6. An IV may be inserted and sedation may be given at the discretion of the physician.   7. A blood pressure cuff, EKG and other monitors will often be applied during the procedure.  Some patients may need to have extra oxygen administered for a short period. 8. You will be asked to provide medical information, including your allergies, prior to the procedure.  We must know immediately if you are taking blood thinners (like Coumadin/Warfarin)  Or if you are allergic to IV iodine contrast (dye). We must know if you could possible be pregnant.  Possible side-effects:  Bleeding from needle site  Infection (rare, may require surgery)  Nerve injury (rare)  Numbness & tingling (temporary)  Difficulty urinating (rare,  temporary)  Spinal headache ( a headache worse with upright posture)  Light -headedness (temporary)  Pain at injection site (several days)  Decreased blood pressure (temporary)  Weakness in arm/leg (temporary)  Pressure sensation in back/neck (temporary)  Call if you experience:  Fever/chills associated with headache or increased back/neck pain.  Headache worsened by an upright position.  New onset weakness or numbness of an extremity below the injection site  Hives or difficulty breathing (go to the emergency room)  Inflammation or drainage at the infection site  Severe back/neck pain  Any new symptoms which are concerning to you  Please note:  Although the local anesthetic injected can often make your back or neck feel good for several hours after the injection, the pain will likely return.  It takes 3-7 days for steroids to work in the epidural space.  You may not notice any pain relief for at least that one week.  If effective, we will often do a series of three injections spaced 3-6 weeks apart to maximally decrease your pain.  After the initial series, we generally will wait several months before considering a repeat injection of the same type.  If you have any questions, please call [phone] [facility]

## 2019-08-20 NOTE — Progress Notes (Signed)
Nursing Pain Medication Assessment:  Safety precautions to be maintained throughout the outpatient stay will include: orient to surroundings, keep bed in low position, maintain call bell within reach at all times, provide assistance with transfer out of bed and ambulation.  Medication Inspection Compliance: Pill count conducted under aseptic conditions, in front of the patient. Neither the pills nor the bottle was removed from the patient's sight at any time. Once count was completed pills were immediately returned to the patient in their original bottle.  Medication: Oxycodone IR Pill/Patch Count: 17 of 120 pills remain Pill/Patch Appearance: Markings consistent with prescribed medication Bottle Appearance: Standard pharmacy container. Clearly labeled. Filled Date: 05 /14/ 2021 Last Medication intake:  Today

## 2019-08-25 ENCOUNTER — Encounter: Payer: Self-pay | Admitting: Pain Medicine

## 2019-08-25 DIAGNOSIS — M5030 Other cervical disc degeneration, unspecified cervical region: Secondary | ICD-10-CM | POA: Insufficient documentation

## 2019-08-25 NOTE — Progress Notes (Signed)
Patient: [name]  Service Category: E/M  Provider: Gaspar Cola, MD  DOB: 1960-07-02  DOS: 08/26/2019  Location: Office  MRN: [id number]  Setting: Ambulatory outpatient  Referring Provider: Virginia Crews, MD  Type: Established Patient  Specialty: Interventional Pain Management  PCP: Virginia Crews, MD  Location: Remote location  Delivery: TeleHealth     Virtual Encounter - Pain Management PROVIDER NOTE: Information contained herein reflects review and annotations entered in association with encounter. Interpretation of such information and data should be left to medically-trained personnel. Information provided to patient can be located elsewhere in the medical record under "Patient Instructions". Document created using STT-dictation technology, any transcriptional errors that may result from process are unintentional.    Contact & Pharmacy Preferred: [place]: [phone] (home) Mobile: [phone] (mobile) E-mail: [email or web address]  [id number] [address] [place] [id number]: [phone]: [phone]  Pre-screening  [name] "in-person" vs "virtual" encounter. She indicated preferring virtual for this encounter.   Reason COVID-19*  Social distancing based on CDC and AMA recommendations.   I contacted [name] 08/26/2019 via telephone.      I clearly identified myself as FGaspar Cola MD. I verified that I was speaking with the correct person using two identifiers (Name: [name] and date of birth: 3Apr 06, 1962.  Consent I sought verbal advanced consent from [name] virtual visit interactions. I informed [name] possible security and privacy concerns, risks, and limitations associated with providing "not-in-person" medical evaluation and management services. I also informed [name]. HCieslakof the availability of "in-person" appointments.  Finally, I informed her that there would be a charge for the virtual visit and that she could be  personally, fully or partially, financially responsible for it. [name] understanding and agreed to proceed.   Historic Elements   [name]. Shelby WASKEYis a 59y.o. year old, female patient evaluated today after her last contact with our practice on 08/20/2019. [name]. 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), Anemia, Anxiety, Asthma, Cephalalgia (08/18/2014), Cervical disc disease (08/18/2014), Chronic headaches, DDD (degenerative disc disease), Depression, Dissociative disorder, Dizziness (04/22/2013), Duodenal ulcer with hemorrhage and perforation ([place] (04/27/2003), GERD (gastroesophageal reflux disease), H/O arthrodesis (08/18/2014), Headache(784.0), History of blood transfusion, History of cervical spinal surgery (01/04/2015), History of kidney stones, Hypertension, Inverted T wave, Narrowing of intervertebral disc space (08/18/2014), Orthostatic hypotension (04/22/2013), Pain, Pneumonia, PONV (postoperative nausea and vomiting), Postop Hyponatremia (05/14/2012), Postoperative anemia due to acute blood loss (05/14/2012), PTSD (post-traumatic stress disorder), Right foot drop, Right hip arthralgia (08/18/2014), and Sleep apnea (2021). She also  has a past surgical history that includes RIGHT HIP ARTHROSCOPY FOR LABRAL TEAR (ABOUT 2010); Anterior fusion cervical spine (MAY 2012); Nasal septum surgery (MARCH 2013); Back surgery (2009); Tonsillectomy; Abdominal hysterectomy; DIAGNOSTIC LAPAROSCOPIES - MULTIPLE FOR ENDOMETRIOSIS; Cholecystectomy; Hip arthroscopy (09/20/2011); Shoulder arthroscopy (05-06-12); Carpal tunnel release (05-06-12); Total hip arthroplasty (Right, 05/13/2012); Anterior cervical decomp/discectomy fusion (N/A, 10/30/2012); Posterior cervical fusion/foraminotomy (N/A, 04/16/2013); Breast reduction surgery (Bilateral,  06/2016); Colonoscopy with propofol (N/A, 01/26/2017); Esophagogastroduodenoscopy (egd) with propofol (N/A, 01/26/2017); Esophageal dilation (N/A, 01/26/2017); polypectomy (N/A, 01/26/2017); PH impedance study (N/A, 05/30/2017); Esophageal manometry (N/A, 05/30/2017); Radiofrequency ablation nerves; Reduction mammaplasty (Bilateral, 05/2016); Breast biopsy (Right, 2008); Breast excisional biopsy (Left, 1998); afib (05/2019); and LEFT HEART CATH AND CORONARY ANGIOGRAPHY (Left, 06/10/2019). [name].  Cooley has a current medication list which includes the following prescription(s): acetaminophen, albuterol, alprazolam, amitiza, apixaban, calcium carbonate, cyclobenzaprine, fluticasone-salmeterol, furosemide, metoprolol succinate, montelukast, omeprazole, ondansetron, oxycodone hcl, [START ON 09/23/2019] oxycodone hcl, [START ON 10/23/2019] oxycodone hcl, pantoprazole, aerochamber mv, and valacyclovir. She  reports that she has never smoked. She has never used smokeless tobacco. She reports that she does not drink alcohol and does not use drugs. [name]. Raben is allergic to amoxicillin, chlorhexidine gluconate, clindamycin, codeine, erythromycin, penicillin g, sulfa antibiotics, levofloxacin, shellfish allergy, decadron [dexamethasone], mangifera indica, papaya derivatives, betadine [povidone iodine], chlorhexidine, clarithromycin, clindamycin/lincomycin, other, povidone-iodine, and prednisone.   HPI  Today, she is being contacted for follow-up evaluation to review the x-rays of the cervical spine.  These x-rays reveal bony encroachment of the right C3-4 foramen.  Today I went over the x-rays and I explained the results in layman's terms.  All of her symptoms in the cervical region are on the right side and she clearly seems to be having a radiculitis in that area that may be secondary to this right C3-4 foraminal stenosis.  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  Because of this reason, she will like to stay away from their interventional procedures.  In addition, she was recently diagnosed with atrial fibrillation and started on Eliquis.  She was also diagnosed with obstructive sleep apnea and she was started on CPAP.  She is already taking oxycodone IR 40 mg/day with an MME of 60.  She is already a high risk candidate for the use of opioid analgesics and therefore I will not be going up on those.  In any case the pain that she is experiencing is neurogenic and therefore does not really respond well to the use of opioids.  Neurogenic pain typically will respond much better to membrane stabilizer such as gabapentin and pregabalin, but the patient indicates that she has already tried dose and they have not worked.  Seem that all of these avenues have been blocked, the only other alternative is to have a neurosurgeon go in and decompress that C3-4 foramina.  I have laid out all the options for the patient and she has chosen to go for the neurosurgical referral to see if they can find a more permanent solution to this problem.  Since the pathology seems to be fairly limited, I believe she may be a good candidate for a decompressive foraminotomy.  Pharmacotherapy Assessment  Analgesic: Oxycodone IR 10 mg, 1 tab PO q 6 hrs (40 mg/day of oxycodone) MME/day: 60 mg/day.   Monitoring: [place] PMP: PDMP reviewed during this encounter.       Pharmacotherapy: No side-effects or adverse reactions reported. Compliance: No problems identified. Effectiveness: Clinically acceptable. Plan: Refer to "POC".  UDS:  Summary  Date Value Ref Range Status  11/11/2018 Note  Final    Comment:    ==================================================================== [employer] Select 13 (MW) ==================================================================== Test                              Result       Flag       Units Drug Present and Declared for Prescription Verification   Alprazolam                     167  EXPECTED   ng/mg creat   Alpha-hydroxyalprazolam        564          EXPECTED   ng/mg creat    Source of alprazolam is a scheduled prescription medication. Alpha-    hydroxyalprazolam is an expected metabolite of alprazolam.   Oxycodone                      2512         EXPECTED   ng/mg creat   Oxymorphone                    1271         EXPECTED   ng/mg creat   Noroxycodone                   6433         EXPECTED   ng/mg creat   Noroxymorphone                 145          EXPECTED   ng/mg creat    Sources of oxycodone are scheduled prescription medications.    Oxymorphone, noroxycodone, and noroxymorphone are expected    metabolites of oxycodone. Oxymorphone is also available as a    scheduled prescription medication. ==================================================================== Test                      Result    Flag   Units      Ref Range   Creatinine              126              mg/dL      >=20 ==================================================================== Declared Medications:  The flagging and interpretation on this report are based on the  following declared medications.  Unexpected results may arise from  inaccuracies in the declared medications.  **Note: The testing scope of this panel includes these medications:  Alprazolam  Oxycodone  **Note: The testing scope of this panel does not include the  following reported medications:  Albuterol  Bupropion (Wellbutrin)  Calcium  Dexlansoprazole (Dexilant)  Docusate  Duloxetine  Fluticasone  Magnesium (Mag-Ox)  Metoprolol  Montelukast  Ondansetron  Pregabalin  Salmeterol  Valacyclovir ==================================================================== For clinical consultation, please call [phone]. ====================================================================      Laboratory Chemistry Profile   Renal Lab Results  Component Value Date   BUN 10 07/10/2019   CREATININE 1.04 (H) 07/10/2019   BCR 7 (L) 11/11/2018   GFRAA >60 07/10/2019   GFRNONAA 58 (L) 07/10/2019     Hepatic Lab Results  Component Value Date   AST 22 06/27/2019   ALT 11 06/27/2019   ALBUMIN 4.6 06/27/2019   ALKPHOS 148 (H) 06/27/2019   LIPASE 23 04/06/2015     Electrolytes Lab Results  Component Value Date   NA 137 07/10/2019   K 3.7 07/10/2019   CL 103 07/10/2019   CALCIUM 9.3 07/10/2019   MG 1.9 11/11/2018     Bone Lab Results  Component Value Date   25OHVITD1 23 (L) 11/11/2018   25OHVITD2 9.8 11/11/2018   25OHVITD3 13 11/11/2018     Inflammation (CRP: Acute Phase) (ESR: Chronic Phase) Lab Results  Component Value Date   CRP 12 (H) 11/11/2018   ESRSEDRATE 42 (H) 11/11/2018       Note: Above Lab results reviewed.   Imaging  DG Cervical  Spine With Flex & Extend CLINICAL DATA:  Neck pain radiating into the shoulder blades. Cervical radiculitis.  EXAM: CERVICAL SPINE COMPLETE WITH FLEXION AND EXTENSION VIEWS  COMPARISON:  04/17/2013  FINDINGS: Again noted is a surgical fusion at C4 through C6. Anterior surgical plate with screws at C4 and C6 with corpectomy changes at C5. Posterior screws and rods on the right side at C4 and C6 and posterior screw and rod on the left side at C5-C6. Surgical hardware is stable from the previous examination. Alignment of the cervical spine is unchanged. Prevertebral soft tissues are within normal limits. Negative for acute fracture. Lung apices are clear. Bony encroachment of the right neural foramen at C3-C4.  IMPRESSION: Stable surgical fusion at C4 through C6. Stable appearance of surgical hardware.  No acute findings.  Bony encroachment of the right neural foramen at C3-C4.  Electronically Signed   By: Markus Daft M.D.   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  Plan of Care  Problem-specific:  No problem-specific Assessment & Plan notes found for this encounter.  [name]. JOSS FRIEDEL has a current medication list which includes the following long-term medication(s): albuterol, apixaban, furosemide, metoprolol succinate, montelukast, oxycodone hcl, [START ON 09/23/2019] oxycodone hcl, [START ON 10/23/2019] oxycodone hcl, and pantoprazole.  Pharmacotherapy (Medications Ordered): No orders of the defined types were placed in this encounter.  Orders:  Orders Placed This Encounter  Procedures  . Ambulatory referral to Neurosurgery    Referral Priority:   Routine    Referral Type:   Surgical    Referral Reason:   Specialty Services Required    Requested Specialty:   Neurosurgery    Number of Visits Requested:   1   Follow-up plan:   Return for regular appointment.      Interventional management options: Considering:   NOTE:  1. SHELLFISH ALLERGY; Dexamethasone & Prednisone allergy. 2. NOT a candidate for lumbar facet RFA (due to hardware). 3. NOT a candidate for membrane stabilizers (Neurontin/Lyrica) due to prior failed trials. 4. NOT a candidate for SCS (due to hardware). 5. NOT a candidate for IT Pump (patient's choice due to surgical risks).  Possiblerightsuprascapular nerve RFA #1   Palliative PRN treatment(s):   Diagnostic bilateral lumbar facet block #2 (No benefit in doing injection w/o steroids.) Palliativebilateral suprascapular NB #3(No Steroids) Palliative leftsuprascapular nerve RFA #2(last done on 01/04/2017)       Recent Visits Date Type Provider Dept  08/20/19 Office Visit Milinda Pointer, MD [facility]  Showing  recent visits within past 90 days and meeting all other requirements Today's Visits Date Type Provider Dept  08/26/19 Office Visit Milinda Pointer, MD [facility]  Showing today's visits and meeting all other requirements Future Appointments Date Type Provider Dept  11/19/19 Appointment Milinda Pointer, MD [facility]  Showing future appointments within next 90 days and meeting all other requirements  I discussed the assessment and treatment plan with the patient. The patient was provided an opportunity to ask questions and all were answered. The patient agreed with the plan and demonstrated an understanding of the instructions.  Patient advised to call back or seek an in-person evaluation if the symptoms or condition worsens.  Duration of encounter: 22 minutes.  Note  by: Gaspar Cola, MD Date: 08/26/2019; Time: 3:21 PM

## 2019-08-26 ENCOUNTER — Ambulatory Visit: Payer: Medicare Other | Attending: Pain Medicine | Admitting: Pain Medicine

## 2019-08-26 ENCOUNTER — Other Ambulatory Visit: Payer: Self-pay

## 2019-08-26 DIAGNOSIS — M9971 Connective tissue and disc stenosis of intervertebral foramina of cervical region: Secondary | ICD-10-CM | POA: Diagnosis not present

## 2019-08-26 DIAGNOSIS — M47812 Spondylosis without myelopathy or radiculopathy, cervical region: Secondary | ICD-10-CM | POA: Diagnosis not present

## 2019-08-26 DIAGNOSIS — M4802 Spinal stenosis, cervical region: Secondary | ICD-10-CM | POA: Diagnosis not present

## 2019-08-26 DIAGNOSIS — M5412 Radiculopathy, cervical region: Secondary | ICD-10-CM

## 2019-08-26 DIAGNOSIS — R519 Headache, unspecified: Secondary | ICD-10-CM | POA: Diagnosis not present

## 2019-08-26 DIAGNOSIS — G4486 Cervicogenic headache: Secondary | ICD-10-CM

## 2019-08-26 DIAGNOSIS — M5030 Other cervical disc degeneration, unspecified cervical region: Secondary | ICD-10-CM

## 2019-08-26 LAB — TOXASSURE SELECT 13 (MW), URINE

## 2019-08-28 ENCOUNTER — Other Ambulatory Visit: Payer: Self-pay

## 2019-08-28 NOTE — Progress Notes (Signed)
[facility] request patient to have a COVID test prior to sleep study on 09/11/2019. Patient aware to go on 09/09/2019 between 8-1pm.

## 2019-09-09 ENCOUNTER — Other Ambulatory Visit: Payer: Self-pay

## 2019-09-09 ENCOUNTER — Other Ambulatory Visit
Admission: RE | Admit: 2019-09-09 | Discharge: 2019-09-09 | Disposition: A | Discharge status: Home / Self Care | Payer: Medicare Other | Source: Ambulatory Visit | Attending: Family Medicine | Admitting: Family Medicine

## 2019-09-09 DIAGNOSIS — Z01812 Encounter for preprocedural laboratory examination: Secondary | ICD-10-CM | POA: Insufficient documentation

## 2019-09-09 DIAGNOSIS — Z20822 Contact with and (suspected) exposure to covid-19: Secondary | ICD-10-CM | POA: Diagnosis not present

## 2019-09-09 LAB — SARS CORONAVIRUS 2 (TAT 6-24 HRS): SARS Coronavirus 2: NEGATIVE

## 2019-09-11 ENCOUNTER — Ambulatory Visit: Payer: Medicare Other | Admitting: Internal Medicine

## 2019-09-11 ENCOUNTER — Ambulatory Visit: Payer: Medicare Other | Attending: Neurology

## 2019-09-11 DIAGNOSIS — G4733 Obstructive sleep apnea (adult) (pediatric): Secondary | ICD-10-CM | POA: Insufficient documentation

## 2019-09-11 DIAGNOSIS — F5101 Primary insomnia: Secondary | ICD-10-CM | POA: Insufficient documentation

## 2019-09-12 ENCOUNTER — Other Ambulatory Visit: Payer: Self-pay

## 2019-09-18 ENCOUNTER — Telehealth: Payer: Self-pay | Admitting: Family Medicine

## 2019-09-18 NOTE — Telephone Encounter (Signed)
Pt called to check the status of receiving her CPAP machine/ Pt stated she hasn't had any word about it yet/ please advise

## 2019-09-19 NOTE — Telephone Encounter (Signed)
Patient advised that we have not received her report from [facility].

## 2019-09-26 ENCOUNTER — Telehealth: Payer: Self-pay

## 2019-09-26 NOTE — Telephone Encounter (Signed)
Copied from [place] [id number]. Topic: General - Other >> Sep 26, 2019  4:27 PM Rainey Pines A wrote: Patient is requesting a callback on the status of her cpap machine order. Please advise

## 2019-09-29 NOTE — Telephone Encounter (Signed)
Pt called about her CPAP machine/ Advised Pt that office is waiting on orders/ Pt asked for a call to let her know how long/ Pt has been waiting about a month/ please advise asap

## 2019-09-29 NOTE — Telephone Encounter (Signed)
Sharyn Lull can you write on script pad order for cpap machine with titration level?in sleep study it listed recommendation, and this we would have to put a request for the sleep [facility] as an order. KW

## 2019-09-29 NOTE — Telephone Encounter (Signed)
I advised patient of your message below Sharyn Lull, she wanted [facility] phone number to speak with someone from company she states it has been 3 months that she has been waiting on machine. KW

## 2019-09-29 NOTE — Telephone Encounter (Signed)
Please check Dr. Sharmaine Base box for orders and bring to my office if orders are in the office.  I do see sleep results under media, but do not see any orders at this time. Sleep study was performed at [facility] telephone number is [phone]. Please have them fax orders for [place] to sign.   Sleep study results show recommend CPAP therapy with a pressure setting of 9 cm H2O with heated humidity and a ResMed F 30 fullface mask size small.  Avoid sleeping in a supine position and measures to maintain patent airway recommended. Recommend weight loss and increased exercise.  Avoid alcohol and sedatives at bedtime.  Avoid driving while feeling drowsy.  Follow-up with referring physician within 2 months after starting CPAP and at any time if any use symptoms worsening.

## 2019-09-29 NOTE — Telephone Encounter (Signed)
Im unsure who patient would speak with or contact to know status of cpap machine? Do I send this patient message to referral department? Or does patient have to reach out to company personally? KW

## 2019-09-29 NOTE — Telephone Encounter (Signed)
Please advise 

## 2019-09-29 NOTE — Telephone Encounter (Signed)
Please review

## 2019-09-29 NOTE — Telephone Encounter (Signed)
Patient is calling to give an update - Patient reports calling the [facility] number. [facility] has sent the titation numbers as well. She reports that the provider is the one that writes a prescription for the CPAP machine to a supply company. And that has not been done. Patients states that she has been waiting 3 months. Since she has the test. Please advise  CB- [phone]

## 2019-09-30 NOTE — Telephone Encounter (Signed)
Patient has been advised, patient states that she does not know what company she would like order for Cpap sent to, will refer and fax order to referral department for review. KW

## 2019-09-30 NOTE — Telephone Encounter (Signed)
Order form for sleep apnea supplies/ machine filled out,. Sleep study results and order also printed. Just need demographics and office note included. Schedule her a follow up with Dr. B 2 months after using device.  Inform patient we have sent.

## 2019-10-01 ENCOUNTER — Telehealth: Payer: Self-pay

## 2019-10-01 NOTE — Telephone Encounter (Signed)
Copied from [place] [id number]. Topic: General - Other >> Oct 01, 2019 11:24 AM Hinda Lenis D wrote: Reason for CRM: PT has question about ordering her CPAP / please call her back

## 2019-10-03 ENCOUNTER — Telehealth: Payer: Self-pay

## 2019-10-03 NOTE — Telephone Encounter (Signed)
Patient was advised that cpap order for materials was redirected back to [facility] who will then forward to [employer] supplier. I advised patient to reach back out to [facility] if her insurance is just inquiring who is delivering Cpap materials. KW

## 2019-10-03 NOTE — Telephone Encounter (Signed)
Please review. It looks like Sharyn Lull and Nunzio Cory have been working on getting this for the patient in a previous message.

## 2019-10-03 NOTE — Telephone Encounter (Signed)
Copied from [place] [id number]. Topic: General - Other >> Oct 03, 2019 12:28 PM Mcneil, Ja-Kwan wrote: Reason for CRM: Pt stated she is not sleeping and she was advised that a prior authorization is needed for the cpap machine. Pt requests call back asap

## 2019-10-10 NOTE — Telephone Encounter (Signed)
Patient is calling again to check the status of her cpap machine.  She stated that she did contact [facility] and they stated that the order has to come from the doctor.  Patient is confused and would like a nurse or doctor to call her to explain the process so she can get her machine as soon as possible.  CB [phone]

## 2019-10-16 ENCOUNTER — Ambulatory Visit: Payer: Medicare Other | Admitting: Cardiology

## 2019-10-16 ENCOUNTER — Telehealth: Payer: Self-pay

## 2019-10-16 NOTE — Telephone Encounter (Signed)
Copied from [place] [id number]. Topic: General - Other >> Oct 16, 2019 12:54 PM Alanda Slim E wrote: Reason for CRM: Pt was diagnosed with sleep apnea 4 months ago and still hasnt received her Cpap machine / Pt asked to speak with office manager / please advise asap

## 2019-10-17 ENCOUNTER — Encounter: Payer: Self-pay | Admitting: Cardiology

## 2019-10-18 ENCOUNTER — Other Ambulatory Visit: Payer: Self-pay | Admitting: Gastroenterology

## 2019-10-20 NOTE — Telephone Encounter (Signed)
Please review patient message below and previous telephone encounters. KW

## 2019-10-21 ENCOUNTER — Telehealth: Payer: Self-pay

## 2019-10-21 MED ORDER — APIXABAN 5 MG PO TABS: 5.0000 mg | ORAL_TABLET | Freq: Two times a day (BID) | ORAL | 5 refills | Status: DC

## 2019-10-21 NOTE — Telephone Encounter (Signed)
Pt last saw Dr Garen Lah 07/14/19, last labs 07/10/19 Creat 1.04, age 59, weight 68kg, based on specified criteria pt is on appropriate dosage of Eliquis 5mg  BID.  Will refill rx.

## 2019-10-21 NOTE — Telephone Encounter (Signed)
Incoming Fax from [facility].  Eliquis 5MG  - 60 tabs 4 refills.

## 2019-10-22 NOTE — Telephone Encounter (Signed)
I have sent over provided information to [facility] in regards to cpap machine and materials just FYI. KW

## 2019-10-23 ENCOUNTER — Telehealth: Payer: Self-pay | Admitting: *Deleted

## 2019-10-23 NOTE — Telephone Encounter (Signed)
Patient called back and would like meds sent to [facility] in [place]

## 2019-10-23 NOTE — Telephone Encounter (Signed)
Patient advised to call around to other pharmacies and ask if anyone has Oxycodone and let us know.

## 2019-10-24 ENCOUNTER — Telehealth: Payer: Self-pay | Admitting: *Deleted

## 2019-10-24 ENCOUNTER — Other Ambulatory Visit: Payer: Self-pay | Admitting: Pain Medicine

## 2019-10-24 DIAGNOSIS — G894 Chronic pain syndrome: Secondary | ICD-10-CM

## 2019-10-24 MED ORDER — MORPHINE SULFATE ER 30 MG PO TBCR: 30.0000 mg | EXTENDED_RELEASE_TABLET | Freq: Two times a day (BID) | ORAL | 0 refills | Status: DC

## 2019-10-24 NOTE — Progress Notes (Signed)
No oxycodone available.

## 2019-10-24 NOTE — Telephone Encounter (Signed)
Patient called stating she is in such pain she may have to go to the emergency room. She called with a Drug store that has Oxycodone and it was not sent there yesterday. Please advise patient of status.

## 2019-10-24 NOTE — Telephone Encounter (Signed)
Patient has called stating that her regular pharmacy does not have her oxycodone 10 mg.  She was told by nursing staff to call around and find a pharmacy that does have it and she has confirmed that [facility] has the medication.  I have left a phone message with patient telling her that Dr Dossie Arbour is choosing to change patient's medications from oxycodone to MS Contin d/t the shortage of oxycodone.  I have not spoken back with patient as of yet to ask if she has ever taken MS Contin.  I do not see any allergies to morphine.  I will wait for patient to call back to check on this.

## 2019-10-24 NOTE — Telephone Encounter (Signed)
[facility]ed [facility] to [facility] existing [facility].   Left voicemail with patient that MS Contin has been sent to pharmacy.

## 2019-10-24 NOTE — Telephone Encounter (Signed)
Voicemail left with patient to talk to her re; not being able to get her oxycodone 10 mg.  I did call her primary [facility] [place] and they do not have the medication.  In the voicemail I told her that with this shortage Dr Dossie Arbour is choosing to switch medications for the patient so that they might be able to get them.  Asked to please return the call to discuss.

## 2019-10-27 ENCOUNTER — Telehealth: Payer: Self-pay | Admitting: Pain Medicine

## 2019-10-27 NOTE — Telephone Encounter (Signed)
Bubble sent to Dr. Naveira. °

## 2019-10-27 NOTE — Telephone Encounter (Signed)
Voicemail left with patient, re; Dr Cleda Daub response to sending oxycodone to different pharmacy.  He is not willing to do this.

## 2019-10-27 NOTE — Telephone Encounter (Signed)
Patient called stating she has suffered a lot of pain this weekend and wants to know why nothing was done on Friday when she called. I explained that Dr. Dossie Arbour sent a script of [place] to her pharmacy and Cecille Rubin left a msg as she was unable to contact patient. Patient then informed me she has a new phone number which I entered into her chart. She states [facility] has Oxycodone. Can Dr. Dossie Arbour please send scripts to them. She says MS Contin does not work for her, she tried several years ago

## 2019-10-29 ENCOUNTER — Telehealth: Payer: Self-pay | Admitting: Family Medicine

## 2019-10-29 NOTE — Telephone Encounter (Signed)
Pt called stating that she was diagnosed with Sleep Apnea 5 months ago. Pt states that she still has not received a CPAP machine and is requesting to have a prescription sent in for one. Please advise.

## 2019-11-03 NOTE — Telephone Encounter (Signed)
Looks like order was faxed on 09/30/2019. Will try faxing this again. Is there a company that you recommend using for CPAPs being this one has not responded to our requests? Please advise. Thanks!

## 2019-11-03 NOTE — Telephone Encounter (Signed)
Maybe could try [facility]?

## 2019-11-04 NOTE — Telephone Encounter (Signed)
Spoke to an agent from [employer], he reports they need copy of Sleep Study. Report faxed to [phone].   Patient advised.

## 2019-11-12 NOTE — Progress Notes (Signed)
Virtual telephone visit    Virtual Visit via Telephone Note   This visit type was conducted due to national recommendations for restrictions regarding the COVID-19 Pandemic (e.g. social distancing) in an effort to limit this patient's exposure and mitigate transmission in our community. Due to her co-morbid illnesses, this patient is at least at moderate risk for complications without adequate follow up. This format is felt to be most appropriate for this patient at this time. The patient did not have access to video technology or had technical difficulties with video requiring transitioning to audio format only (telephone). Physical exam was limited to content and character of the telephone conversation.     Patient location: home Provider location: [place] involved in the visit: patient, provider  I discussed the limitations of evaluation and management by telemedicine and the availability of in person appointments. The patient expressed understanding and agreed to proceed.    Visit Date: 11/13/2019  Today's healthcare provider: Lavon Paganini, MD   Chief Complaint  Patient presents with  . Sore Throat   Subjective    HPI  Patient C/O sore throat on and off for two weeks. Patient reports soreness is better now. Patient reports hoarseness, voice change and hard to swallow. Patient reports diarrhea since yesterday, abdominal pain and bloating. Patient reports blood in her stools, she believes its from hemorrhoid. Patient reports negative COVID test last week Wednesday. Patient reports chills no fever, cough, or ear pain.   +COVID vaccination  Had something similar 2 years ago when reflux got bad.  Has constant burning sensation.  Taking Pantoprazole BID. Decreased appetite and weight loss.    Patient Active Problem List   Diagnosis Date Noted  . Foraminal stenosis of cervical region (C3-4) (Right) 08/26/2019  . Neural foraminal stenosis of  cervical spine (C3-4) (Right) 08/26/2019  . DDD (degenerative disc disease), cervical 08/25/2019  . Cervical radiculitis (C4,C5,C8) (Left) 08/20/2019  . Pure hypercholesterolemia 06/27/2019  . Constipation due to opioid therapy 06/27/2019  . Snoring 06/27/2019  . Unstable angina ([place]) 06/10/2019  . Chronic musculoskeletal pain 01/14/2019  . Hypertension 12/27/2018  . Spondylosis without myelopathy or radiculopathy, lumbosacral region 11/12/2018  . History of allergy to radiographic contrast media 11/12/2018  . History of allergy to shellfish 11/12/2018  . DDD (degenerative disc disease), lumbosacral 07/31/2018  . Neuropathic pain 07/31/2018  . Neurogenic pain 07/31/2018  . Vitamin D deficiency 05/28/2018  . History of lumbar surgery 05/28/2018  . Groin pain, chronic, left 05/20/2018  . Other intervertebral disc degeneration, lumbar region 05/20/2018  . Disorder of skeletal system 05/20/2018  . Problems influencing health status 05/20/2018  . Overweight 12/25/2017  . Enthesopathy of hip region (Left) 07/13/2017  . Trigger point of shoulder region (Left) 02/26/2017  . Pharmacologic therapy   . Polyp of sigmoid colon   . Problems with swallowing and mastication   . Chronic shoulder arthropathy (Left) 01/04/2017  . Dysphagia 12/07/2016  . Abnormal flushing and sweating 12/07/2016  . Long term prescription benzodiazepine use 10/11/2016  . 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 Chronic hip pain (Secondary Area of Pain) (Bilateral) (L>R) 10/11/2016  . 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 Cervicogenic headache 07/06/2015  . History of total hip replacement (THR) (Right) 07/05/2015  . Chronic shoulder radicular pain (Bilateral) (L>R) 07/05/2015  . Lumbar facet syndrome (Bilateral) (L>R) 07/05/2015  . Chronic low back pain (Primary area of Pain) (Bilateral) (L>R) w/o sciatica 04/05/2015  .  Chronic hip pain (Right) 01/04/2015  .  Encounter for therapeutic drug level monitoring 01/04/2015  . Long term current use of opiate analgesic 01/04/2015  . Long term prescription opiate use 01/04/2015  . Opiate use 01/04/2015  . Chronic pain syndrome 01/04/2015  . Steroid intolerance 01/04/2015  . Cervical spondylosis 01/04/2015  . Cervical facet arthropathy (Bilateral) 01/04/2015  . History of cervical spinal surgery 01/04/2015  . Chronic shoulder pain [facility] Area of Pain)  (Bilateral) (L>R) 01/04/2015  . 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 Lumbar spondylosis 01/04/2015  . Cervical facet syndrome (Bilateral) 01/04/2015  . Chronic sacroiliac joint pain (Left) 01/04/2015  . DOE (dyspnea on exertion) 09/03/2014  . Asthma, mild 08/18/2014  . Blurred vision 08/18/2014  . Benign paroxysmal positional nystagmus 08/18/2014  . Clinical depression 08/18/2014  . Fatigue 08/18/2014  . Cannot sleep 08/18/2014  . Awareness of heartbeats 08/18/2014  . RAD (reactive airway disease) 08/18/2014  . Apnea, sleep 08/18/2014  . Chronic neck pain 04/16/2013  . Anxiety state 08/03/2003  . Colon, diverticulosis 07/15/2003  . IBS (irritable bowel syndrome) 04/27/2003  . Esophagitis, reflux 02/11/2003  . 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 Smoking status: Never Smoker  . Smokeless tobacco: Never Used  Vaping Use  . Vaping Use: Never used  Substance Use Topics  . Alcohol use: No    Alcohol/week: 0.0 standard drinks  . Drug use: No   Allergies  Allergen Reactions  . Amoxicillin Hives  . Chlorhexidine Gluconate Dermatitis and Hives  . Clindamycin Hives  . Codeine Hives  . Erythromycin Hives    "mycins" in general  . Penicillin G Hives    "cillins" in general  . Sulfa Antibiotics Nausea And Vomiting and Hives       . Levofloxacin Hives  . Shellfish Allergy Hives  . Decadron [Dexamethasone] Other (See Comments)      Hot flashes, insomnia, "manic" Hot flashes, insomnia, "manic"  . Mangifera Indica Hives    papaya  . Papaya Derivatives Hives  . Betadine [Povidone Iodine] Hives       . Chlorhexidine Hives       . Clarithromycin Hives  . Clindamycin/Lincomycin Hives  . Other Hives     Mango   . Povidone-Iodine Hives  . Prednisone Anxiety    High blood pressure, flushed, mood changes, heart palpitations High blood pressure, flushed, mood changes, heart palpitations      Medications: Outpatient Medications Prior to Visit  Medication Sig  . acetaminophen (TYLENOL) 500 MG tablet Take 500 mg by mouth every 6 (six) hours as needed.  Marland Kitchen albuterol (PROVENTIL HFA) 108 (90 Base) MCG/ACT inhaler Inhale 1-2 puffs into the lungs every 4 (four) hours as needed.   . ALPRAZolam (XANAX) 1 MG tablet Take 1 mg by mouth daily as needed.  Marland Kitchen apixaban (ELIQUIS) 5 MG TABS tablet Take 1 tablet (5 mg total) by mouth 2 (two) times daily.  . calcium carbonate (TUMS - DOSED IN MG ELEMENTAL CALCIUM) 500 MG chewable tablet Chew 1 tablet by mouth 3 (three) times daily with meals. As needed  . cyclobenzaprine (FLEXERIL) 5 MG tablet Take 1 tablet (5 mg total) by mouth 3 (three) times daily as needed for muscle spasms.  . Fluticasone-Salmeterol (ADVAIR) 500-50 MCG/DOSE AEPB Inhale 1 puff into the lungs 2 (two) times daily. As needed  . furosemide (LASIX) 40 MG tablet Take 1 tablet (40  mg total) by mouth every other day. Take 1 tablet (40 mg) by mouth every other day x 7 days (total of 4 doses)  . metoprolol succinate (TOPROL-XL) 25 MG 24 hr tablet Take 1 tablet (25 mg total) by mouth daily. Take with or immediately following a meal.  . montelukast (SINGULAIR) 10 MG tablet TAKE 1 TABLET BY MOUTH EVERY DAY  . morphine (MS CONTIN) 30 MG 12 hr tablet Take 1 tablet (30 mg total) by mouth every 12 (twelve) hours. Must last 30 days. Do not break tablet.  . ondansetron (ZOFRAN) 4 MG tablet TAKE 1 TABLET BY MOUTH EVERY 8 HOURS AS NEEDED  FOR NAUSEA AND VOMITING  . pantoprazole (PROTONIX) 40 MG tablet TAKE 1 TABLET BY MOUTH TWICE A DAY  . Spacer/Aero-Holding Chambers (AEROCHAMBER MV) inhaler by Does not apply route.  . valACYclovir (VALTREX) 1000 MG tablet TAKE TWO TABLETS BY MOUTH TWICE DAILY FOR ONE DAY FEVER BLISTER  . [DISCONTINUED] omeprazole (PRILOSEC) 40 MG capsule Take 40 mg by mouth in the morning and at bedtime.  . [DISCONTINUED] AMITIZA 24 MCG capsule Take 1 capsule (24 mcg total) by mouth 2 (two) times daily with a meal. (Patient not taking: Reported on 11/12/2019)   No facility-administered medications prior to visit.    Review of Systems  Constitutional: Positive for chills. Negative for fever.  HENT: Positive for sore throat, trouble swallowing and voice change. Negative for congestion, ear pain, mouth sores, postnasal drip, rhinorrhea, sinus pressure and sinus pain.   Respiratory: Positive for shortness of breath. Negative for cough and wheezing.   Gastrointestinal: Positive for abdominal distention, abdominal pain, blood in stool and diarrhea. Negative for nausea.    Last CBC Lab Results  Component Value Date   WBC 5.8 07/10/2019   HGB 12.8 07/10/2019   HCT 39.3 07/10/2019   MCV 81.0 07/10/2019   MCH 26.4 07/10/2019   RDW 13.8 07/10/2019   PLT 377 07/10/2019      Objective    There were no vitals taken for this visit.    Speaking in full sentences in NAD Hoarse voice   Assessment & Plan     Problem List Items Addressed This Visit      Digestive   Esophagitis, reflux - Primary    Chronic Recently worsening Continue tums prn Continue pantoprazole BID Has tried and failed other PPIs Has upcoming appt with GI - may need repeat EGD/H pylori testing Add sucralfate QID Elevate head of bed Discussed dietary restrictions          Return if symptoms worsen or fail to improve.    I discussed the assessment and treatment plan with the patient. The patient was provided an opportunity to  ask questions and all were answered. The patient agreed with the plan and demonstrated an understanding of the instructions.   The patient was advised to call back or seek an in-person evaluation if the symptoms worsen or if the condition fails to improve as anticipated.   I provided 15 minutes of non-face-to-face time during this encounter.   I, Lavon Paganini, MD, have reviewed all documentation for this visit. The documentation on 11/13/19 for the exam, diagnosis, procedures, and orders are all accurate and complete.   Deran Barro, Dionne Bucy, MD, MPH [facility]

## 2019-11-13 ENCOUNTER — Ambulatory Visit (INDEPENDENT_AMBULATORY_CARE_PROVIDER_SITE_OTHER): Payer: Medicare Other | Admitting: Family Medicine

## 2019-11-13 ENCOUNTER — Other Ambulatory Visit: Payer: Self-pay

## 2019-11-13 DIAGNOSIS — K2100 Gastro-esophageal reflux disease with esophagitis, without bleeding: Secondary | ICD-10-CM

## 2019-11-13 MED ORDER — SUCRALFATE 1 GM/10ML PO SUSP: 1.0000 g | Freq: Three times a day (TID) | ORAL | 1 refills | Status: DC

## 2019-11-13 NOTE — Assessment & Plan Note (Signed)
Chronic Recently worsening Continue tums prn Continue pantoprazole BID Has tried and failed other PPIs Has upcoming appt with GI - may need repeat EGD/H pylori testing Add sucralfate QID Elevate head of bed Discussed dietary restrictions

## 2019-11-19 ENCOUNTER — Other Ambulatory Visit: Payer: Self-pay

## 2019-11-19 ENCOUNTER — Encounter: Payer: Self-pay | Admitting: Pain Medicine

## 2019-11-19 ENCOUNTER — Ambulatory Visit: Payer: Medicare Other | Attending: Pain Medicine | Admitting: Pain Medicine

## 2019-11-19 VITALS — BP 152/87 | HR 56 | Temp 97.2°F | Ht 64.0 in | Wt 152.0 lb

## 2019-11-19 DIAGNOSIS — M9971 Connective tissue and disc stenosis of intervertebral foramina of cervical region: Secondary | ICD-10-CM | POA: Diagnosis not present

## 2019-11-19 DIAGNOSIS — R519 Headache, unspecified: Secondary | ICD-10-CM | POA: Diagnosis not present

## 2019-11-19 DIAGNOSIS — G894 Chronic pain syndrome: Secondary | ICD-10-CM | POA: Insufficient documentation

## 2019-11-19 DIAGNOSIS — M4802 Spinal stenosis, cervical region: Secondary | ICD-10-CM | POA: Diagnosis not present

## 2019-11-19 DIAGNOSIS — M5030 Other cervical disc degeneration, unspecified cervical region: Secondary | ICD-10-CM | POA: Insufficient documentation

## 2019-11-19 DIAGNOSIS — G4486 Cervicogenic headache: Secondary | ICD-10-CM

## 2019-11-19 DIAGNOSIS — Z79899 Other long term (current) drug therapy: Secondary | ICD-10-CM | POA: Diagnosis not present

## 2019-11-19 DIAGNOSIS — M5412 Radiculopathy, cervical region: Secondary | ICD-10-CM | POA: Diagnosis not present

## 2019-11-19 DIAGNOSIS — M47812 Spondylosis without myelopathy or radiculopathy, cervical region: Secondary | ICD-10-CM | POA: Diagnosis not present

## 2019-11-19 MED ORDER — MORPHINE SULFATE 15 MG PO TABS: 15.0000 mg | ORAL_TABLET | Freq: Four times a day (QID) | ORAL | 0 refills | Status: DC | PRN

## 2019-11-19 NOTE — Progress Notes (Signed)
Nursing Pain Medication Assessment:  Safety precautions to be maintained throughout the outpatient stay will include: orient to surroundings, keep bed in low position, maintain call bell within reach at all times, provide assistance with transfer out of bed and ambulation.  Medication Inspection Compliance: Pill count conducted under aseptic conditions, in front of the patient. Neither the pills nor the bottle was removed from the patient's sight at any time. Once count was completed pills were immediately returned to the patient in their original bottle.  Medication: Morphine ER (MSContin) Pill/Patch Count: 11 of 60 pills remain Pill/Patch Appearance: Markings consistent with prescribed medication Bottle Appearance: Standard pharmacy container. Clearly labeled. Filled Date: 8 / 57 / 21 Last Medication intake:  TodaySafety precautions to be maintained throughout the outpatient stay will include: orient to surroundings, keep bed in low position, maintain call bell within reach at all times, provide assistance with transfer out of bed and ambulation.

## 2019-11-19 NOTE — Progress Notes (Signed)
PROVIDER NOTE: Information contained herein reflects review and annotations entered in association with encounter. Interpretation of such information and data should be left to medically-trained personnel. Information provided to patient can be located elsewhere in the medical record under "Patient Instructions". Document created using STT-dictation technology, any transcriptional errors that may result from process are unintentional.    Patient: [name]  Service Category: E/M  Provider: Gaspar Cola, MD  DOB: 1960-11-06  DOS: 11/19/2019  Specialty: Interventional Pain Management  MRN: [id number]  Setting: Ambulatory outpatient  PCP: Virginia Crews, MD  Type: Established Patient    Referring Provider: Virginia Crews, MD  Location: Office  Delivery: Face-to-face     HPI  Reason for encounter: [name], a 59 y.o. year old female, is here today for evaluation and management of her Chronic pain syndrome [G89.4]. [name] primary complain today is Generalized Body Aches Last encounter: Practice (10/27/2019). My last encounter with her was on 10/27/2019. 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); Chronic hip pain (Secondary Area of Pain) (Bilateral)  (L>R); Chronic shoulder arthropathy (Left); Trigger point of shoulder region (Left); Enthesopathy of hip region (Left); Groin pain, chronic, left; Other intervertebral disc degeneration, lumbar region; History of lumbar surgery; DDD (degenerative disc disease), lumbosacral; Neuropathic pain; Neurogenic pain; Spondylosis without myelopathy or radiculopathy, lumbosacral region; Chronic musculoskeletal pain; Cervical radiculitis (C4,C5,C8) (Left); DDD (degenerative disc disease), cervical; Foraminal stenosis of cervical region (C3-4) (Right); and 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 Pain Assessment: Severity of Chronic pain is reported as a 7 /10. Location: Generalized  /pain radiaties down both leg, left is worse, hand and feet goes to sleep at times. Onset: More than a month ago. Quality: Aching, Burning, Sharp, Throbbing, Shooting. Timing: Intermittent. Modifying factor(s): laying , heat, meds. Vitals:  height is _0  (1.626 m) and weight is 152 lb (68.9 kg). Her temperature is 97.2 F (36.2 C) (abnormal). Her blood pressure is 152/87 (abnormal) and her pulse is 56 (abnormal). Her oxygen saturation is 100%.   Today the patient comes into the clinics indicating that she has been experiencing more pain in the lower back and hips.  On the patient's last encounter we switched her oxycodone IR 10 mg, 1 tablet p.o. every 6 hours (40 mg/day of oxycodone) (60 MME) to morphine ER 30 mg, 1 tablet p.o. twice daily (16 mg/day of morphine) (60 MME), as a way to manage the problems with the current shortage from oxycodone.  The last procedure done (01/14/2019) for this patient was a diagnostic bilateral lumbar facet block #2 without any steroids.  The problem here is that the patient has significant hardware from prior back surgeries which makes it difficult to accomplish a proper radiofrequency ablation.     Pharmacotherapy Assessment   Analgesic: Oxycodone IR  10 mg, 1 tab PO q 6 hrs (40 mg/day of oxycodone) MME/day: 60 mg/day.  Monitoring: [place] PMP: PDMP reviewed during this encounter.       Pharmacotherapy: No side-effects or adverse reactions reported. Compliance: No problems identified. Effectiveness: Clinically acceptable.  Chauncey Fischer, RN  11/19/2019 10:35 AM  Sign when Signing Visit Nursing Pain Medication Assessment:  Safety precautions to be maintained throughout the outpatient stay will include: orient to surroundings, keep bed in low position, maintain call bell within reach at all times, provide assistance with transfer out of bed and ambulation.  Medication Inspection Compliance: Pill count conducted under aseptic conditions, in front of the patient. Neither the pills nor the bottle was removed from the patient's sight at any time. Once count was completed pills were immediately returned to the patient in their original bottle.  Medication: Morphine ER (MSContin) Pill/Patch Count: 11 of 60 pills remain Pill/Patch Appearance: Markings consistent with prescribed medication Bottle Appearance: Standard pharmacy container. Clearly labeled. Filled Date: 8 / 38 / 21 Last Medication intake:  TodaySafety precautions to be maintained throughout the outpatient stay will include: orient to surroundings, keep bed in low position, maintain call bell within reach at all times, provide assistance with transfer out of bed and ambulation.     UDS:  Summary  Date Value Ref Range Status  08/20/2019 Note  Final    Comment:    ==================================================================== [employer] Select 13 (MW) ==================================================================== Test                             Result       Flag       Units  Drug Present and Declared for Prescription Verification   Alprazolam                     132          EXPECTED   ng/mg creat   Alpha-hydroxyalprazolam        293          EXPECTED   ng/mg creat     Source of alprazolam is a scheduled prescription medication. Alpha-    hydroxyalprazolam is an expected metabolite of alprazolam.    Oxycodone                      2443         EXPECTED   ng/mg creat   Oxymorphone                    922          EXPECTED   ng/mg creat   Noroxycodone                   >9434        EXPECTED   ng/mg creat   Noroxymorphone                 225          EXPECTED   ng/mg creat    Sources of oxycodone are scheduled prescription medications.    Oxymorphone, noroxycodone, and noroxymorphone are expected    metabolites of oxycodone. Oxymorphone is also available as a    scheduled prescription medication.  ==================================================================== Test                      Result    Flag   Units      Ref Range   Creatinine  106              mg/dL      >=20 ==================================================================== Declared Medications:  The flagging and interpretation on this report are based on the  following declared medications.  Unexpected results may arise from  inaccuracies in the declared medications.   **Note: The testing scope of this panel includes these medications:   Alprazolam (Xanax)  Oxycodone   **Note: The testing scope of this panel does not include the  following reported medications:   Acetaminophen (Tylenol)  Albuterol (Ventolin HFA)  Apixaban (Eliquis)  Calcium (Tums)  Cyclobenzaprine (Flexeril)  Fluticasone (Advair)  Furosemide (Lasix)  Lubiprostone (Amitiza)  Metoprolol (Toprol)  Omeprazole (Prilosec)  Ondansetron (Zofran)  Pantoprazole (Protonix)  Salmeterol (Advair)  Valacyclovir (Valtrex) ==================================================================== For clinical consultation, please call [phone]. ====================================================================      ROS  Constitutional: Denies any fever or chills Gastrointestinal: No reported hemesis,  hematochezia, vomiting, or acute GI distress Musculoskeletal: Denies any acute onset joint swelling, redness, loss of ROM, or weakness Neurological: No reported episodes of acute onset apraxia, aphasia, dysarthria, agnosia, amnesia, paralysis, loss of coordination, or loss of consciousness  Medication Review  ALPRAZolam, AeroChamber MV, Fluticasone-Salmeterol, acetaminophen, albuterol, apixaban, calcium carbonate, cyclobenzaprine, furosemide, metoprolol succinate, montelukast, morphine, ondansetron, pantoprazole, sucralfate, and valACYclovir  History Review  Allergy: [name] is allergic to amoxicillin, chlorhexidine gluconate, clindamycin, codeine, erythromycin, penicillin g, sulfa antibiotics, levofloxacin, shellfish allergy, decadron [dexamethasone], mangifera indica, papaya derivatives, betadine [povidone iodine], chlorhexidine, clarithromycin, clindamycin/lincomycin, other, povidone-iodine, and prednisone. Drug: [name]  reports no history of drug use. Alcohol:  reports no history of alcohol use. Tobacco:  reports that she has never smoked. She has never used smokeless tobacco. Social: [name]  reports that she has never smoked. She has never used smokeless tobacco. She reports that she does not drink alcohol and does not use drugs. 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), Anemia, Anxiety, Asthma, Cephalalgia (08/18/2014), Cervical disc disease (08/18/2014), Chronic headaches, DDD (degenerative disc disease), Depression, Dissociative disorder, Dizziness (04/22/2013), Duodenal ulcer with hemorrhage and perforation ([place]) (04/27/2003), GERD (gastroesophageal reflux disease), H/O arthrodesis (08/18/2014), Headache(784.0), History of blood transfusion, History of cervical spinal surgery (01/04/2015), History of kidney stones, Hypertension, Inverted T wave, Narrowing of intervertebral disc space (08/18/2014),  Orthostatic hypotension (04/22/2013), Pain, Pneumonia, PONV (postoperative nausea and vomiting), Postop Hyponatremia (05/14/2012), Postoperative anemia due to acute blood loss (05/14/2012), PTSD (post-traumatic stress disorder), Right foot drop, Right hip arthralgia (08/18/2014), and Sleep apnea (2021). Surgical: [name]  has a past surgical history that includes RIGHT HIP ARTHROSCOPY FOR LABRAL TEAR (ABOUT 2010); Anterior fusion cervical spine (MAY 2012); Nasal septum surgery (MARCH 2013); Back surgery (2009); Tonsillectomy; Abdominal hysterectomy; DIAGNOSTIC LAPAROSCOPIES - MULTIPLE FOR ENDOMETRIOSIS; Cholecystectomy; Hip arthroscopy (09/20/2011); Shoulder arthroscopy (05-06-12); Carpal tunnel release (05-06-12); Total hip arthroplasty (Right, 05/13/2012); Anterior cervical decomp/discectomy fusion (N/A, 10/30/2012); Posterior cervical fusion/foraminotomy (N/A, 04/16/2013); Breast reduction surgery (Bilateral, 06/2016); Colonoscopy with propofol (N/A, 01/26/2017); Esophagogastroduodenoscopy (egd) with propofol (N/A, 01/26/2017); Esophageal dilation (N/A, 01/26/2017); polypectomy (N/A, 01/26/2017); PH impedance study (N/A, 05/30/2017); Esophageal manometry (N/A, 05/30/2017); Radiofrequency ablation nerves; Reduction mammaplasty (Bilateral, 05/2016); Breast biopsy (Right, 2008); Breast excisional biopsy (Left, 1998); afib (05/2019); and LEFT HEART CATH AND CORONARY ANGIOGRAPHY (Left, 06/10/2019). Family: family history includes Aneurysm in her maternal grandmother and mother; Anxiety disorder in her grandchild; Bipolar disorder in her grandchild and sister; Breast cancer (age of onset: 18) in her paternal grandmother; Depression  in her grandchild.  Laboratory Chemistry Profile   Renal Lab Results  Component Value Date   BUN 10 07/10/2019   CREATININE 1.04 (H) 07/10/2019   BCR 7 (L) 11/11/2018   GFRAA >60 07/10/2019   GFRNONAA 58 (L) 07/10/2019     Hepatic Lab Results  Component Value Date   AST 22 06/27/2019    ALT 11 06/27/2019   ALBUMIN 4.6 06/27/2019   ALKPHOS 148 (H) 06/27/2019   LIPASE 23 04/06/2015     Electrolytes Lab Results  Component Value Date   NA 137 07/10/2019   K 3.7 07/10/2019   CL 103 07/10/2019   CALCIUM 9.3 07/10/2019   MG 1.9 11/11/2018     Bone Lab Results  Component Value Date   25OHVITD1 23 (L) 11/11/2018   25OHVITD2 9.8 11/11/2018   25OHVITD3 13 11/11/2018     Inflammation (CRP: Acute Phase) (ESR: Chronic Phase) Lab Results  Component Value Date   CRP 12 (H) 11/11/2018   ESRSEDRATE 42 (H) 11/11/2018       Note: Above Lab results reviewed.  Recent Imaging Review  SLEEP STUDY DOCUMENTS Ordered by an unspecified provider. Note: Reviewed        Physical Exam  General appearance: Well nourished, well developed, and well hydrated. In no apparent acute distress Mental status: Alert, oriented x 3 (person, place, & time)       Respiratory: No evidence of acute respiratory distress Eyes: PERLA Vitals: BP (!) 152/87   Pulse (!) 56   Temp (!) 97.2 F (36.2 C)   Ht _0  (1.626 m)   Wt 152 lb (68.9 kg)   SpO2 100%   BMI 26.09 kg/m  BMI: Estimated body mass index is 26.09 kg/m as calculated from the following:   Height as of this encounter: _1  (1.626 m).   Weight as of this encounter: 152 lb (68.9 kg). Ideal: Ideal body weight: 54.7 kg (120 lb 9.5 oz) Adjusted ideal body weight: 60.4 kg (133 lb 2.5 oz)  Assessment   Status Diagnosis  Controlled Controlled Controlled 1. Chronic pain syndrome   2. Foraminal stenosis of cervical region (C3-4) (Right)   3. 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 Cervical radiculitis (C4,C5,C8) (Left)   5. Cervical spondylosis   6. Cervical facet syndrome (Bilateral)   7. Cervical facet arthropathy (Bilateral)   8. Cervicogenic headache   9. DDD (degenerative disc disease), cervical   10. Pharmacologic therapy      Updated Problems: No problems updated.  Plan of Care    Problem-specific:  No problem-specific Assessment & Plan notes found for this encounter.  [name] has a current medication list which includes the following long-term medication(s): albuterol, apixaban, metoprolol succinate, montelukast, morphine, [START ON 12/23/2019] morphine, [START ON 01/22/2020] morphine, pantoprazole, sucralfate, and furosemide.  Pharmacotherapy (Medications Ordered): Meds ordered this encounter  Medications  . morphine (MSIR) 15 MG tablet    Sig: Take 1 tablet (15 mg total) by mouth every 6 (six) hours as needed for moderate pain or severe pain. Must last 30 days.    Dispense:  120 tablet    Refill:  0    Chronic Pain: STOP Act (Not applicable) Fill 1 day early if closed on refill date. Avoid benzodiazepines within 8 hours of opioids  . morphine (MSIR) 15 MG tablet    Sig: Take 1 tablet (15 mg total) by mouth every 6 (six) hours as needed for moderate pain or severe  pain. Must last 30 days.    Dispense:  120 tablet    Refill:  0    Chronic Pain: STOP Act (Not applicable) Fill 1 day early if closed on refill date. Avoid benzodiazepines within 8 hours of opioids  . morphine (MSIR) 15 MG tablet    Sig: Take 1 tablet (15 mg total) by mouth every 6 (six) hours as needed for moderate pain or severe pain. Must last 30 days.    Dispense:  120 tablet    Refill:  0    Chronic Pain: STOP Act (Not applicable) Fill 1 day early if closed on refill date. Avoid benzodiazepines within 8 hours of opioids   Orders:  Orders Placed This Encounter  Procedures  . Ambulatory referral to Neurosurgery    Referral Priority:   Routine    Referral Type:   Surgical    Referral Reason:   Specialty Services Required    Requested Specialty:   Neurosurgery    Number of Visits Requested:   1   Follow-up plan:   No follow-ups on file.      Interventional management options: Considering:   NOTE:  1. SHELLFISH ALLERGY; Dexamethasone & Prednisone allergy. 2. NOT a candidate  for lumbar facet RFA (due to hardware). 3. NOT a candidate for membrane stabilizers (Neurontin/Lyrica) due to prior failed trials. 4. NOT a candidate for SCS (due to hardware). 5. NOT a candidate for IT Pump (patient's choice due to surgical risks).  Possiblerightsuprascapular nerve RFA #1   Palliative PRN treatment(s):   Diagnostic bilateral lumbar facet block #2 (No benefit in doing injection w/o steroids.) Palliativebilateral suprascapular NB #3(No Steroids) Palliative leftsuprascapular nerve RFA #2(last done on 01/04/2017)    Recent Visits Date Type Provider Dept  11/19/19 Office Visit Milinda Pointer, MD [facility]  08/26/19 Office Visit Milinda Pointer, MD [facility]  Showing recent visits within past 90 days and meeting all other requirements Future Appointments Date Type Provider Dept  02/09/20 Appointment Milinda Pointer, MD [facility]  Showing future appointments within next 90 days and meeting all other requirements  I discussed the assessment and treatment plan with the patient. The patient was provided an opportunity to ask questions and all were answered. The patient agreed with the plan and demonstrated an understanding of the instructions.  Patient advised to call back or seek an in-person evaluation if the symptoms or condition worsens.  Duration of encounter: 30 minutes.  Note by: Gaspar Cola, MD Date: 11/19/2019; Time: 4:54 PM

## 2019-11-23 DIAGNOSIS — Z5329 Procedure and treatment not carried out because of patient's decision for other reasons: Secondary | ICD-10-CM | POA: Diagnosis not present

## 2019-11-23 DIAGNOSIS — R55 Syncope and collapse: Secondary | ICD-10-CM | POA: Diagnosis not present

## 2019-11-23 DIAGNOSIS — R000 Tachycardia, unspecified: Secondary | ICD-10-CM | POA: Diagnosis not present

## 2019-11-23 DIAGNOSIS — I4891 Unspecified atrial fibrillation: Secondary | ICD-10-CM | POA: Diagnosis not present

## 2019-11-23 DIAGNOSIS — R0902 Hypoxemia: Secondary | ICD-10-CM | POA: Diagnosis not present

## 2019-11-23 DIAGNOSIS — I499 Cardiac arrhythmia, unspecified: Secondary | ICD-10-CM | POA: Diagnosis not present

## 2019-11-23 DIAGNOSIS — R404 Transient alteration of awareness: Secondary | ICD-10-CM | POA: Diagnosis not present

## 2019-11-23 DIAGNOSIS — Z743 Need for continuous supervision: Secondary | ICD-10-CM | POA: Diagnosis not present

## 2019-11-25 ENCOUNTER — Ambulatory Visit: Payer: Medicare Other | Admitting: Gastroenterology

## 2019-11-25 ENCOUNTER — Encounter: Payer: Self-pay | Admitting: Gastroenterology

## 2019-11-25 ENCOUNTER — Other Ambulatory Visit: Payer: Self-pay

## 2019-11-25 VITALS — BP 125/82 | HR 84 | Temp 97.9°F | Ht 64.0 in | Wt 148.6 lb

## 2019-11-25 DIAGNOSIS — K219 Gastro-esophageal reflux disease without esophagitis: Secondary | ICD-10-CM | POA: Diagnosis not present

## 2019-11-25 NOTE — Progress Notes (Signed)
Established patient visit   Patient: [name]   DOB: 09-Nov-1960   59 y.o. Female  MRN: [id number] Visit Date: 11/26/2019  Today's healthcare provider: Trinna Post, PA-C   Chief Complaint  Patient presents with  . Follow-up  I,Porsha C McClurkin,acting as a scribe for Trinna Post, PA-C.,have documented all relevant documentation on the behalf of Trinna Post, PA-C,as directed by  Trinna Post, PA-C while in the presence of Trinna Post, PA-C.   Subjective    HPI  Follow up ER visit  Patient was seen in ER ([facility]) for passed out on 11/23/2019. She was triaged and received labs but left before being seen due to long wait times Treatment for this included none. She reports poor compliance with treatment. She reports this condition is Unchanged.  Patient with atrial fibrillation on Xarelto and metoprolol xL 50 mg QD presents today with 3 days of feeling poorly and one syncopal episode on 11/23/2019. She was at a baseball game and almost passed out. EMS took her to [facility] ER where she underwent EKG. The physical EKG is not available but the report is available in care everywhere and it showed A-fib with RVR and some non-specific t-depressions. Her first troponin TnT was indeterminate but the second was 10. Her CBC showed a mildly elevated white count at 10.7. COvid testing negative.   Today she reports continuing to feel poorly. She endorses palpitations, chest pain and chest tightness. She reports feeling clammy and profuse sweating. She has felt this way since 11/23/2019. She also reports some vomiting. She denies abdominal pain. She reports feeling light headed and as if she will pass out.  -----------------------------------------------------------------------------------------        Medications: Outpatient Medications Prior to Visit  Medication Sig  . acetaminophen (TYLENOL) 500 MG tablet Take 500 mg by mouth every 6 (six) hours as needed.  Marland Kitchen  albuterol (PROVENTIL HFA) 108 (90 Base) MCG/ACT inhaler Inhale 1-2 puffs into the lungs every 4 (four) hours as needed.   . ALPRAZolam (XANAX) 1 MG tablet Take 1 mg by mouth daily as needed.  Marland Kitchen apixaban (ELIQUIS) 5 MG TABS tablet Take 1 tablet (5 mg total) by mouth 2 (two) times daily.  . calcium carbonate (TUMS - DOSED IN MG ELEMENTAL CALCIUM) 500 MG chewable tablet Chew 1 tablet by mouth 3 (three) times daily with meals. As needed  . cyclobenzaprine (FLEXERIL) 5 MG tablet Take 1 tablet (5 mg total) by mouth 3 (three) times daily as needed for muscle spasms.  . Fluticasone-Salmeterol (ADVAIR) 500-50 MCG/DOSE AEPB Inhale 1 puff into the lungs 2 (two) times daily. As needed  . metoprolol succinate (TOPROL-XL) 25 MG 24 hr tablet Take 1 tablet (25 mg total) by mouth daily. Take with or immediately following a meal.  . montelukast (SINGULAIR) 10 MG tablet TAKE 1 TABLET BY MOUTH EVERY DAY  . morphine (MSIR) 15 MG tablet Take 1 tablet (15 mg total) by mouth every 6 (six) hours as needed for moderate pain or severe pain. Must last 30 days.  Derrill Memo ON 12/23/2019] morphine (MSIR) 15 MG tablet Take 1 tablet (15 mg total) by mouth every 6 (six) hours as needed for moderate pain or severe pain. Must last 30 days.  Derrill Memo ON 01/22/2020] morphine (MSIR) 15 MG tablet Take 1 tablet (15 mg total) by mouth every 6 (six) hours as needed for moderate pain or severe pain. Must last 30 days.  . ondansetron (ZOFRAN) 4  MG tablet TAKE 1 TABLET BY MOUTH EVERY 8 HOURS AS NEEDED FOR NAUSEA AND VOMITING  . pantoprazole (PROTONIX) 40 MG tablet TAKE 1 TABLET BY MOUTH TWICE A DAY  . Spacer/Aero-Holding Chambers (AEROCHAMBER MV) inhaler by Does not apply route.  . sucralfate (CARAFATE) 1 GM/10ML suspension Take 10 mLs (1 g total) by mouth 4 (four) times daily -  with meals and at bedtime.  . valACYclovir (VALTREX) 1000 MG tablet TAKE TWO TABLETS BY MOUTH TWICE DAILY FOR ONE DAY FEVER BLISTER  . furosemide (LASIX) 40 MG tablet  Take 1 tablet (40 mg total) by mouth every other day. Take 1 tablet (40 mg) by mouth every other day x 7 days (total of 4 doses)   No facility-administered medications prior to visit.    Review of Systems     Objective    BP 112/82 (BP Location: Left Arm, Patient Position: Sitting, Cuff Size: Normal)   Pulse 60   Temp 97.7 F (36.5 C) (Oral)   Wt 149 lb 6.4 oz (67.8 kg)   SpO2 99%   BMI 25.64 kg/m     Physical Exam Constitutional:      General: She is not in acute distress.    Appearance: Normal appearance. She is diaphoretic.  Cardiovascular:     Rate and Rhythm: Tachycardia present. Rhythm irregularly irregular.     Heart sounds: Normal heart sounds.  Pulmonary:     Effort: Pulmonary effort is normal.     Breath sounds: Normal breath sounds.  Neurological:     Mental Status: She is alert and oriented to person, place, and time. Mental status is at baseline.  Psychiatric:        Mood and Affect: Mood normal.        Behavior: Behavior normal.       No results found for any visits on 11/26/19.  Assessment & Plan    1. Heart palpitations  EKG with Afib with RVR and some ST depressions that are perhaps slightly worse than the last EKG we have on file. Patient is currently on metoprolol XL 50 mg QD and ELiquis. Due to prolonged symptoms, syncopal events and patient's current presentation, I have directed her to the ER. Have called ahead.   - EKG 12-Lead    No follow-ups on file.      ITrinna Post, PA-C, have reviewed all documentation for this visit. The documentation on 11/26/19 for the exam, diagnosis, procedures, and orders are all accurate and complete.  The entirety of the information documented in the History of Present Illness, Review of Systems and Physical Exam were personally obtained by me. Portions of this information were initially documented by [facility] and reviewed by me for thoroughness and accuracy.   I spent 30 minutes dedicated to  the care of this patient on the date of this encounter to include pre-visit review of records, face-to-face time with the patient discussing afib with RVR, and post visit ordering of testing.   Paulene Floor  [facility] [phone] (phone) [phone] (fax)  [place]

## 2019-11-25 NOTE — Progress Notes (Signed)
Primary Care Physician: Virginia Crews, MD  Primary Gastroenterologist:  Dr. Lucilla Lame  Chief Complaint  Patient presents with   Follow-up    constipation, rectal bleeding    HPI: [name] is a 59 y.o. female here for follow-up of her constipation and continued GERD. The patient had a pH study with significant amount of reflux.  The patient continues to have reflux symptoms despite taking Protonix twice a day.  She has been tried on multiple PPIs with no avail.  She also reports that her constipation continues to be a problem.  Past Medical History:  Diagnosis Date   A-fib ([place]) 2021   Abnormal EKG    HX OF INVERTED T WAVES ON EKG, PALPITATIONS, CHEST PAINS-CARDIAC WORK UP DID NOT SHOW ANY HEART DISEASE   Acute postoperative pain 01/04/2017   Addison anemia 08/15/2004   Anemia    Iron Infusion-8 yrs ago   Anxiety    Asthma    INHALERS WITH FLARE -UPS   Cephalalgia 08/18/2014   Cervical disc disease 08/18/2014   Needs neck surgery.    Chronic headaches    DDD (degenerative disc disease)    CERVICAL AND LUMBAR-CHRONIC PAIN, RT HIP LABRAL TEAR   Depression    PT STATES A LOT OF STRESS IN HER LIFE   Dissociative disorder    Dizziness 04/22/2013   Duodenal ulcer with hemorrhage and perforation ([place]) 04/27/2003   GERD (gastroesophageal reflux disease)    H/O arthrodesis 08/18/2014   Headache(784.0)    AND NECK PAIN--STATES RECENT TEST SHOW CERVICAL DEGENERATION   History of blood transfusion    s/p back surgery   History of cervical spinal surgery 01/04/2015   History of kidney stones    Hypertension    Inverted T wave    Narrowing of intervertebral disc space 08/18/2014   Currently on disability.    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    Sleep apnea 2021    Current Outpatient Medications  Medication Sig Dispense Refill   acetaminophen (TYLENOL) 500 MG tablet Take 500 mg by mouth every 6 (six) hours as needed.     albuterol (PROVENTIL HFA) 108 (90 Base) MCG/ACT inhaler Inhale 1-2 puffs into the lungs every 4 (four) hours as needed.      ALPRAZolam (XANAX) 1 MG tablet Take 1 mg by mouth daily as needed.     apixaban (ELIQUIS) 5 MG TABS tablet Take 1 tablet (5 mg total) by mouth 2 (two) times daily. 60 tablet 5   calcium carbonate (TUMS - DOSED IN MG ELEMENTAL CALCIUM) 500 MG chewable tablet Chew 1 tablet by mouth 3 (three) times daily with meals. As needed     cyclobenzaprine (FLEXERIL) 5 MG tablet Take 1 tablet (5 mg total) by mouth 3 (three) times daily as needed for muscle spasms. 30 tablet 0   Fluticasone-Salmeterol (ADVAIR) 500-50 MCG/DOSE AEPB Inhale 1 puff into the lungs  2 (two) times daily. As needed     metoprolol succinate (TOPROL-XL) 25 MG 24 hr tablet Take 1 tablet (25 mg total) by mouth daily. Take with or immediately following a meal. 90 tablet 1   montelukast (SINGULAIR) 10 MG tablet TAKE 1 TABLET BY MOUTH EVERY DAY 90 tablet 1   morphine (MSIR) 15 MG tablet Take 1 tablet (15 mg total) by mouth every 6 (six) hours as needed for moderate pain or severe pain. Must last 30 days. 120 tablet 0   ondansetron (ZOFRAN) 4 MG tablet TAKE 1 TABLET BY MOUTH EVERY 8 HOURS AS NEEDED FOR NAUSEA AND VOMITING 20 tablet 1   pantoprazole (PROTONIX) 40 MG tablet TAKE 1 TABLET BY MOUTH TWICE A DAY 60 tablet 6   Spacer/Aero-Holding Chambers (AEROCHAMBER MV) inhaler by Does not apply route.      sucralfate (CARAFATE) 1 GM/10ML suspension Take 10 mLs (1 g total) by mouth 4 (four) times daily -  with meals and at bedtime. 840 mL 1   valACYclovir (VALTREX) 1000 MG tablet TAKE TWO TABLETS BY MOUTH TWICE DAILY FOR ONE DAY FEVER BLISTER 4 tablet 5   furosemide (LASIX) 40 MG tablet Take 1 tablet (40 mg total) by mouth every other day. Take 1 tablet (40 mg) by mouth every other day x 7 days (total of 4 doses) 45 tablet 0   [START ON 12/23/2019] morphine (MSIR) 15 MG tablet Take 1 tablet (15 mg total) by mouth every 6 (six) hours as needed for moderate pain or severe pain. Must last 30 days. (Patient not taking: Reported on 11/25/2019) 120 tablet 0   [START ON 01/22/2020] morphine (MSIR) 15 MG tablet Take 1 tablet (15 mg total) by mouth every 6 (six) hours as needed for moderate pain or severe pain. Must last 30 days. (Patient not taking: Reported on 11/25/2019) 120 tablet 0   No current facility-administered medications for this visit.    Allergies as of 11/25/2019 - Review Complete 11/25/2019  Allergen Reaction Noted   Amoxicillin Hives 08/20/2014   Chlorhexidine gluconate Dermatitis and Hives 07/06/2016   Clindamycin Hives 08/20/2014   Codeine Hives 08/21/2011   Erythromycin Hives 07/06/2016   Penicillin g Hives 07/06/2016   Sulfa antibiotics Nausea And Vomiting and Hives 09/06/2011   Levofloxacin Hives 02/18/2018   Shellfish allergy Hives 10/08/2012   Decadron [dexamethasone] Other (See Comments) 09/20/2011   Mangifera indica Hives 07/20/2016   Papaya derivatives Hives 10/30/2012   Betadine [povidone iodine] Hives 09/06/2011   Chlorhexidine Hives 09/06/2011   Clarithromycin Hives 09/08/2011   Clindamycin/lincomycin Hives 04/30/2012   Other Hives 09/06/2011   Povidone-iodine Hives 09/06/2011   Prednisone Anxiety 08/18/2014    ROS:  General: Negative for anorexia, weight loss, fever, chills, fatigue, weakness. ENT: Negative for hoarseness, difficulty  swallowing , nasal congestion. CV: Negative for chest pain, angina, palpitations, dyspnea on exertion, peripheral edema.  Respiratory: Negative for dyspnea at rest, dyspnea on exertion, cough, sputum, wheezing.  GI: See history of present illness. GU:  Negative for dysuria, hematuria, urinary incontinence, urinary frequency, nocturnal urination.  Endo: Negative for unusual weight change.    Physical Examination:   BP 125/82 (BP Location: Left Arm, Patient Position: Sitting, Cuff Size: Normal)    Pulse 84    Temp 97.9 F (36.6 C) (Oral)    Ht 5\' 4"  (1.626 m)    Wt 148 lb 9.6 oz (67.4 kg)    BMI 25.51 kg/m   General: Well-nourished, well-developed in no acute distress.  Eyes:  No icterus. Conjunctivae pink. Lungs: Clear to auscultation bilaterally. Non-labored. Heart: Regular rate and rhythm, no murmurs rubs or gallops.  Abdomen: Bowel sounds are normal, nontender, nondistended, no hepatosplenomegaly or masses, no abdominal bruits or hernia , no rebound or guarding.   Extremities: No lower extremity edema. No clubbing or deformities. Neuro: Alert and oriented x 3.  Grossly intact. Skin: Warm and dry, no jaundice.   Psych: Alert and cooperative, normal mood and affect.  Labs:    Imaging Studies: No results found.  Assessment and Plan:   LASHA ECHEVERRIA is a 59 y.o. y/o female with chronic reflux and a positive pH probe done in 2019.  The patient continues to have reflux despite her PPI twice a day. The patient also has considerable constipation.  She has been taking Dulcolax to help her symptoms and usually has to take it for a few days before she has results.  The patient will be set up for a consultation with surgery for possible antireflux surgery.  The patient has also been given samples of Truelance and she was not helped by Linzess 72 g or 145 but had diarrhea on the 290 g.  She has also been given samples of Motegrity.  She will try one and then the other to see which one works  for her and then call us if she finds one to work whereupon she will be given a prescription for that one.  The patient has been explained the plan and agrees with it.     Lucilla Lame, MD. [facility]    Note: This dictation was prepared with Dragon dictation along with smaller phrase technology. Any transcriptional errors that result from this process are unintentional.

## 2019-11-26 ENCOUNTER — Emergency Department: Payer: Medicare Other

## 2019-11-26 ENCOUNTER — Emergency Department
Admission: EM | Admit: 2019-11-26 | Discharge: 2019-11-26 | Disposition: A | Discharge status: Home / Self Care | Payer: Medicare Other | Attending: Student in an Organized Health Care Education/Training Program | Admitting: Student in an Organized Health Care Education/Training Program

## 2019-11-26 ENCOUNTER — Ambulatory Visit (INDEPENDENT_AMBULATORY_CARE_PROVIDER_SITE_OTHER): Payer: Medicare Other | Admitting: Physician Assistant

## 2019-11-26 ENCOUNTER — Other Ambulatory Visit: Payer: Self-pay

## 2019-11-26 ENCOUNTER — Encounter: Payer: Self-pay | Admitting: Physician Assistant

## 2019-11-26 ENCOUNTER — Encounter: Payer: Self-pay | Admitting: Emergency Medicine

## 2019-11-26 VITALS — BP 112/82 | HR 60 | Temp 97.7°F | Wt 149.4 lb

## 2019-11-26 DIAGNOSIS — Z7951 Long term (current) use of inhaled steroids: Secondary | ICD-10-CM | POA: Insufficient documentation

## 2019-11-26 DIAGNOSIS — Z79899 Other long term (current) drug therapy: Secondary | ICD-10-CM | POA: Insufficient documentation

## 2019-11-26 DIAGNOSIS — I4891 Unspecified atrial fibrillation: Secondary | ICD-10-CM | POA: Diagnosis not present

## 2019-11-26 DIAGNOSIS — Z7901 Long term (current) use of anticoagulants: Secondary | ICD-10-CM | POA: Diagnosis not present

## 2019-11-26 DIAGNOSIS — Z96641 Presence of right artificial hip joint: Secondary | ICD-10-CM | POA: Diagnosis not present

## 2019-11-26 DIAGNOSIS — I10 Essential (primary) hypertension: Secondary | ICD-10-CM | POA: Insufficient documentation

## 2019-11-26 DIAGNOSIS — J45909 Unspecified asthma, uncomplicated: Secondary | ICD-10-CM | POA: Diagnosis not present

## 2019-11-26 DIAGNOSIS — R002 Palpitations: Secondary | ICD-10-CM | POA: Diagnosis not present

## 2019-11-26 DIAGNOSIS — R079 Chest pain, unspecified: Secondary | ICD-10-CM | POA: Diagnosis not present

## 2019-11-26 DIAGNOSIS — R0789 Other chest pain: Secondary | ICD-10-CM | POA: Diagnosis not present

## 2019-11-26 LAB — TROPONIN I (HIGH SENSITIVITY)
Troponin I (High Sensitivity): 37 ng/L — ABNORMAL HIGH (ref <18)
Troponin I (High Sensitivity): 25 ng/L — ABNORMAL HIGH (ref <18)

## 2019-11-26 LAB — BASIC METABOLIC PANEL
Anion gap: 10 (ref 5–15)
BUN: 10 mg/dL (ref 6–20)
CO2: 26 mmol/L (ref 22–32)
Calcium: 9.5 mg/dL (ref 8.9–10.3)
Chloride: 101 mmol/L (ref 98–111)
Creatinine, Ser: 0.74 mg/dL (ref 0.44–1.00)
GFR calc Af Amer: >60 mL/min (ref >60)
GFR calc non Af Amer: >60 mL/min (ref >60)
Glucose, Bld: 102 mg/dL — ABNORMAL HIGH (ref 70–99)
Potassium: 3.1 mmol/L — ABNORMAL LOW (ref 3.5–5.1)
Sodium: 137 mmol/L (ref 135–145)

## 2019-11-26 LAB — CBC
HCT: 40.2 % (ref 36.0–46.0)
Hemoglobin: 13.3 g/dL (ref 12.0–15.0)
MCH: 27 pg (ref 26.0–34.0)
MCHC: 33.1 g/dL (ref 30.0–36.0)
MCV: 81.5 fL (ref 80.0–100.0)
Platelets: 427 10*3/uL — ABNORMAL HIGH (ref 150–400)
RBC: 4.93 MIL/uL (ref 3.87–5.11)
RDW: 13.3 % (ref 11.5–15.5)
WBC: 6.4 10*3/uL (ref 4.0–10.5)
nRBC: 0 % (ref 0.0–0.2)

## 2019-11-26 MED ORDER — METOPROLOL TARTRATE 25 MG PO TABS
25.0000 mg | ORAL_TABLET | Freq: Once | ORAL | Status: AC
  Administered 2019-11-26: 25 mg via ORAL
  Filled 2019-11-26: qty 1

## 2019-11-26 NOTE — ED Provider Notes (Signed)
[facility] Emergency Department Provider Note    First MD Initiated Contact with Patient 11/26/19 1855     (approximate)  I have reviewed the triage vital signs and the nursing notes.   HISTORY  Chief Complaint Atrial Fibrillation, Chest Pain, and Loss of Consciousness    HPI [name] is a 59 y.o. female with extensive past medical history as listed below presents to the ER for evaluation of over 1 week of palpitations central chest discomfort and pressure.  Recently diagnosed with A. fib and feels like it is acting up..  She has been compliant with her metoprolol as well as Eliquis.  Denies any fevers.  He was seen at [facility] today tested negative for Covid.  No other recent medication changes.    LHC 3/21: 1. No angiographically significant coronary artery disease. 2. Normal left ventricular contraction with mildly elevated filling pressure, suggestive of diastolic heart failure.   Past Medical History:  Diagnosis Date  . A-fib ([place]) 2021  . Abnormal EKG    HX OF INVERTED T WAVES ON EKG, PALPITATIONS, CHEST PAINS-CARDIAC WORK UP DID NOT SHOW ANY HEART DISEASE  . Acute postoperative pain 01/04/2017  . 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 Anemia    Iron Infusion-8 yrs ago  . Anxiety   . Asthma    INHALERS WITH FLARE -UPS  . Cephalalgia 08/18/2014  . Cervical disc disease 08/18/2014   Needs neck surgery.   . Chronic headaches   . DDD (degenerative disc disease)    CERVICAL AND LUMBAR-CHRONIC PAIN, RT HIP LABRAL TEAR  . Depression    PT STATES A LOT OF STRESS IN HER LIFE  . Dissociative disorder   . Dizziness 04/22/2013  . Duodenal ulcer with hemorrhage and perforation ([place]) 04/27/2003  . GERD (gastroesophageal reflux disease)   . H/O arthrodesis 08/18/2014  . Headache(784.0)    AND NECK PAIN--STATES RECENT TEST SHOW CERVICAL DEGENERATION  . History of blood transfusion    s/p back surgery  . History of cervical spinal surgery 01/04/2015    . History of kidney stones   . Hypertension   . Inverted T wave   . Narrowing of intervertebral disc space 08/18/2014   Currently on disability.   . Orthostatic hypotension 04/22/2013  . Pain    CHRONIC NECK AND BACK PAIN - LIMITED ROM NECK - S/P FUSIONS CERVICAL AND LUMBAR  . Pneumonia   . PONV (postoperative nausea and vomiting)    PT GIVES HX OF N&V AND FEVER WITH SURGERIES YEARS AGO--BUT NO PROBLEMS WITH MORE RECENT SURGERIES--STATES NOT MALIGNANT HYPERTHERMIA  . Postop Hyponatremia 05/14/2012  . Postoperative anemia due to acute blood loss 05/14/2012  . PTSD (post-traumatic stress disorder)   . Right foot drop   . 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   . Sleep apnea 2021   Family History  Problem Relation Age of Onset  . Aneurysm Mother   . Aneurysm Maternal Grandmother   . Breast cancer Paternal Grandmother 40  . Bipolar disorder Sister   . Bipolar disorder Grandchild   . Anxiety disorder Grandchild   . Depression Grandchild    Past Surgical History:  Procedure Laterality Date  . ABDOMINAL HYSTERECTOMY    . afib  05/2019  . ANTERIOR CERVICAL DECOMP/DISCECTOMY FUSION N/A 10/30/2012   Procedure: ACDF C5-6, EXPLORATION AND HARDWARE REMOVAL C6-7;  Surgeon: Melina Schools, MD;  Location: [place];  Service: Orthopedics;  Laterality:  N/A;  . ANTERIOR FUSION CERVICAL SPINE  MAY 2012   AT [facility]  . BACK SURGERY  2009   LUMBAR FUSION WITH RODS   . BREAST BIOPSY Right 2008   benign.- bx/clip  . BREAST EXCISIONAL BIOPSY Left 1998   benign  . BREAST REDUCTION SURGERY Bilateral 06/2016  . CARPAL TUNNEL RELEASE  05-06-12   Right  . CHOLECYSTECTOMY    . COLONOSCOPY WITH PROPOFOL N/A 01/26/2017   Procedure: COLONOSCOPY WITH PROPOFOL;  Surgeon: Lucilla Lame, MD;  Location: [place];  Service: Endoscopy;  Laterality: N/A;  . DIAGNOSTIC LAPAROSCOPIES - MULTIPLE FOR ENDOMETRIOSIS    . ESOPHAGEAL DILATION N/A 01/26/2017   Procedure:  ESOPHAGEAL DILATION;  Surgeon: Lucilla Lame, MD;  Location: [place];  Service: Endoscopy;  Laterality: N/A;  . ESOPHAGEAL MANOMETRY N/A 05/30/2017   Procedure: ESOPHAGEAL MANOMETRY (EM);  Surgeon: Lucilla Lame, MD;  Location: [facility];  Service: Endoscopy;  Laterality: N/A;  . ESOPHAGOGASTRODUODENOSCOPY (EGD) WITH PROPOFOL N/A 01/26/2017   Procedure: ESOPHAGOGASTRODUODENOSCOPY (EGD) WITH PROPOFOL;  Surgeon: Lucilla Lame, MD;  Location: [place];  Service: Endoscopy;  Laterality: N/A;  . HIP ARTHROSCOPY  09/20/2011   Procedure: ARTHROSCOPY HIP;  Surgeon: Gearlean Alf, MD;  Location: [facility];  Service: Orthopedics;  Laterality: Right;  Right Hip Scope with Labral Debridement  . LEFT HEART CATH AND CORONARY ANGIOGRAPHY Left 06/10/2019   Procedure: LEFT HEART CATH AND CORONARY ANGIOGRAPHY;  Surgeon: Nelva Bush, MD;  Location: [place] [facility];  Service: Cardiovascular;  Laterality: Left;  . NASAL SEPTUM SURGERY  MARCH 2013   IN [place]  . [place] IMPEDANCE STUDY N/A 05/30/2017   Procedure: [place] IMPEDANCE STUDY;  Surgeon: Lucilla Lame, MD;  Location: [facility];  Service: Endoscopy;  Laterality: N/A;  . POLYPECTOMY N/A 01/26/2017   Procedure: POLYPECTOMY;  Surgeon: Lucilla Lame, MD;  Location: [place];  Service: Endoscopy;  Laterality: N/A;  . POSTERIOR CERVICAL FUSION/FORAMINOTOMY N/A 04/16/2013   Procedure: REMOVAL CERVICAL PLATES AND INTERBODY CAGE/POSTERIOR CERVICAL SPINAL FUSION C4 - C6/C5 CORPECTOMY/C4 - C6 FUSION WITH ILIAC CREST BONE GRAFT;  Surgeon: Melina Schools, MD;  Location: [place];  Service: Orthopedics;  Laterality: N/A;  . RADIOFREQUENCY ABLATION NERVES    . REDUCTION MAMMAPLASTY Bilateral 05/2016  . RIGHT HIP ARTHROSCOPY FOR LABRAL TEAR  ABOUT 2010   2012 also  . SHOULDER ARTHROSCOPY  05-06-12   bone spur  . TONSILLECTOMY     AS A CHILD  . TOTAL HIP ARTHROPLASTY Right 05/13/2012   Procedure: TOTAL HIP ARTHROPLASTY ANTERIOR APPROACH;   Surgeon: Gearlean Alf, MD;  Location: [facility];  Service: Orthopedics;  Laterality: Right;   Patient Active Problem List   Diagnosis Date Noted  . Foraminal stenosis of cervical region (C3-4) (Right) 08/26/2019  . 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) 08/26/2019  . DDD (degenerative disc disease), cervical 08/25/2019  . Cervical radiculitis (C4,C5,C8) (Left) 08/20/2019  . Pure hypercholesterolemia 06/27/2019  . Constipation due to opioid therapy 06/27/2019  . Snoring 06/27/2019  . Unstable angina ([place]) 06/10/2019  . Chronic musculoskeletal pain 01/14/2019  . Hypertension 12/27/2018  . Spondylosis without myelopathy or radiculopathy, lumbosacral region 11/12/2018  . History of allergy to radiographic contrast media 11/12/2018  . History of allergy to shellfish 11/12/2018  . DDD (degenerative disc disease), lumbosacral 07/31/2018  . Neuropathic pain 07/31/2018  . Neurogenic pain 07/31/2018  . Vitamin D deficiency 05/28/2018  . History of lumbar surgery 05/28/2018  . Groin pain, chronic, left  05/20/2018  . Other intervertebral disc degeneration, lumbar region 05/20/2018  . Disorder of skeletal system 05/20/2018  . Problems influencing health status 05/20/2018  . Overweight 12/25/2017  . Enthesopathy of hip region (Left) 07/13/2017  . Trigger point of shoulder region (Left) 02/26/2017  . Pharmacologic therapy   . Polyp of sigmoid colon   . Problems with swallowing and mastication   . Chronic shoulder arthropathy (Left) 01/04/2017  . Dysphagia 12/07/2016  . Abnormal flushing and sweating 12/07/2016  . Long term prescription benzodiazepine use 10/11/2016  . 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 Chronic hip pain (Secondary Area of Pain) (Bilateral) (L>R) 10/11/2016  . 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 Cervicogenic headache 07/06/2015  . History of total hip replacement (THR) (Right) 07/05/2015  . Chronic  shoulder radicular pain (Bilateral) (L>R) 07/05/2015  . Lumbar facet syndrome (Bilateral) (L>R) 07/05/2015  . Chronic low back pain (Primary area of Pain) (Bilateral) (L>R) w/o sciatica 04/05/2015  . Chronic hip pain (Right) 01/04/2015  . Encounter for therapeutic drug level monitoring 01/04/2015  . Long term current use of opiate analgesic 01/04/2015  . Long term prescription opiate use 01/04/2015  . Opiate use 01/04/2015  . Chronic pain syndrome 01/04/2015  . Steroid intolerance 01/04/2015  . Cervical spondylosis 01/04/2015  . Cervical facet arthropathy (Bilateral) 01/04/2015  . History of cervical spinal surgery 01/04/2015  . Chronic shoulder pain [facility] Area of Pain)  (Bilateral) (L>R) 01/04/2015  . 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 Lumbar spondylosis 01/04/2015  . Cervical facet syndrome (Bilateral) 01/04/2015  . Chronic sacroiliac joint pain (Left) 01/04/2015  . DOE (dyspnea on exertion) 09/03/2014  . Asthma, mild 08/18/2014  . Blurred vision 08/18/2014  . Benign paroxysmal positional nystagmus 08/18/2014  . Clinical depression 08/18/2014  . Fatigue 08/18/2014  . Cannot sleep 08/18/2014  . Awareness of heartbeats 08/18/2014  . RAD (reactive airway disease) 08/18/2014  . Apnea, sleep 08/18/2014  . Chronic neck pain 04/16/2013  . Anxiety state 08/03/2003  . Colon, diverticulosis 07/15/2003  . IBS (irritable bowel syndrome) 04/27/2003  . Esophagitis, reflux 02/11/2003  . 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 Authorizing Provider  acetaminophen (TYLENOL) 500 MG tablet Take 500 mg by mouth every 6 (six) hours as needed.    [provider]  albuterol (PROVENTIL HFA) 108 (90 Base) MCG/ACT inhaler Inhale 1-2 puffs into the lungs every 4 (four) hours as needed.     [provider]  ALPRAZolam  Duanne Moron) 1 MG tablet Take 1 mg by mouth daily as needed. 07/04/19   [provider]  apixaban (ELIQUIS) 5 MG TABS tablet Take 1 tablet (5 mg total) by mouth 2 (two) times daily. 10/21/19   Kate Sable, MD  calcium carbonate (TUMS - DOSED IN MG ELEMENTAL CALCIUM) 500 MG chewable tablet Chew 1 tablet by mouth 3 (three) times daily with meals. As needed    [provider]  cyclobenzaprine (FLEXERIL) 5 MG tablet Take 1 tablet (5 mg total) by mouth 3 (three) times daily as needed for muscle spasms. 07/04/19   Virginia Crews, MD  Fluticasone-Salmeterol (ADVAIR) 500-50 MCG/DOSE AEPB Inhale 1 puff into the lungs 2 (two) times daily. As needed    [provider]  furosemide (LASIX) 40 MG tablet Take 1 tablet (40 mg total) by mouth every  other day. Take 1 tablet (40 mg) by mouth every other day x 7 days (total of 4 doses) 06/26/19 11/12/19  Kate Sable, MD  metoprolol succinate (TOPROL-XL) 25 MG 24 hr tablet Take 1 tablet (25 mg total) by mouth daily. Take with or immediately following a meal. 07/14/19   Agbor-Etang, Aaron Edelman, MD  montelukast (SINGULAIR) 10 MG tablet TAKE 1 TABLET BY MOUTH EVERY DAY 06/23/19   Bacigalupo, Dionne Bucy, MD  morphine (MSIR) 15 MG tablet Take 1 tablet (15 mg total) by mouth every 6 (six) hours as needed for moderate pain or severe pain. Must last 30 days. 11/23/19 12/23/19  Milinda Pointer, MD  morphine (MSIR) 15 MG tablet Take 1 tablet (15 mg total) by mouth every 6 (six) hours as needed for moderate pain or severe pain. Must last 30 days. 12/23/19 01/22/20  Milinda Pointer, MD  morphine (MSIR) 15 MG tablet Take 1 tablet (15 mg total) by mouth every 6 (six) hours as needed for moderate pain or severe pain. Must last 30 days. 01/22/20 02/21/20  Milinda Pointer, MD  ondansetron (ZOFRAN) 4 MG tablet TAKE 1 TABLET BY MOUTH EVERY 8 HOURS AS NEEDED FOR NAUSEA AND VOMITING 06/23/19   Virginia Crews, MD  pantoprazole (PROTONIX) 40 MG tablet TAKE 1  TABLET BY MOUTH TWICE A DAY 10/20/19   Lucilla Lame, MD  Spacer/Aero-Holding Chambers (AEROCHAMBER MV) inhaler by Does not apply route. 06/24/14   [provider]  sucralfate (CARAFATE) 1 GM/10ML suspension Take 10 mLs (1 g total) by mouth 4 (four) times daily -  with meals and at bedtime. 11/13/19   Virginia Crews, MD  valACYclovir (VALTREX) 1000 MG tablet TAKE TWO TABLETS BY MOUTH TWICE DAILY FOR ONE DAY FEVER BLISTER 05/10/18   Virginia Crews, MD    Allergies Amoxicillin, Chlorhexidine gluconate, Clindamycin, Codeine, Erythromycin, Penicillin g, Sulfa antibiotics, Levofloxacin, Shellfish allergy, Decadron [dexamethasone], Mangifera indica, Papaya derivatives, Betadine [povidone iodine], Chlorhexidine, Clarithromycin, Clindamycin/lincomycin, Other, Povidone-iodine, and Prednisone    Social History Social History   Tobacco Use  . Smoking status: Never Smoker  . Smokeless tobacco: Never Used  Vaping Use  . Vaping Use: Never used  Substance Use Topics  . Alcohol use: No    Alcohol/week: 0.0 standard drinks  . Drug use: No    Review of Systems Patient denies headaches, rhinorrhea, blurry vision, numbness, shortness of breath, chest pain, edema, cough, abdominal pain, nausea, vomiting, diarrhea, dysuria, fevers, rashes or hallucinations unless otherwise stated above in HPI. ____________________________________________   PHYSICAL EXAM:  VITAL SIGNS: Vitals:   11/26/19 1900 11/26/19 1932  BP: 120/88 (!) 122/102  Pulse: 92 (!) 108  Resp: 19   Temp:    SpO2: 99%     Constitutional: Alert and oriented.  Eyes: Conjunctivae are normal.  Head: Atraumatic. Nose: No congestion/rhinnorhea. Mouth/Throat: Mucous membranes are moist.   Neck: No stridor. Painless ROM.  Cardiovascular: Normal rate, regular rhythm. Grossly normal heart sounds.  Good peripheral circulation. Respiratory: Normal respiratory effort.  No retractions. Lungs CTAB. Gastrointestinal: Soft and  nontender. No distention. No abdominal bruits. No CVA tenderness. Genitourinary:  Musculoskeletal: No lower extremity tenderness nor edema.  No joint effusions. Neurologic:  Normal speech and language. No gross focal neurologic deficits are appreciated. No facial droop Skin:  Skin is warm, dry and intact. No rash noted. Psychiatric: Mood and affect are normal. Speech and behavior are normal.  ____________________________________________   LABS (all labs ordered are listed, but only abnormal results are displayed)  Results  for orders placed or performed during the hospital encounter of 11/26/19 (from the past 24 hour(s))  Basic metabolic panel     Status: Abnormal   Collection Time: 11/26/19 12:13 PM  Result Value Ref Range   Sodium 137 135 - 145 mmol/L   Potassium 3.1 (L) 3.5 - 5.1 mmol/L   Chloride 101 98 - 111 mmol/L   CO2 26 22 - 32 mmol/L   Glucose, Bld 102 (H) 70 - 99 mg/dL   BUN 10 6 - 20 mg/dL   Creatinine, Ser 0.74 0.44 - 1.00 mg/dL   Calcium 9.5 8.9 - 10.3 mg/dL   GFR calc non Af Amer >60 >60 mL/min   GFR calc Af Amer >60 >60 mL/min   Anion gap 10 5 - 15  CBC     Status: Abnormal   Collection Time: 11/26/19 12:13 PM  Result Value Ref Range   WBC 6.4 4.0 - 10.5 K/uL   RBC 4.93 3.87 - 5.11 MIL/uL   Hemoglobin 13.3 12.0 - 15.0 g/dL   HCT 40.2 36 - 46 %   MCV 81.5 80.0 - 100.0 fL   MCH 27.0 26.0 - 34.0 pg   MCHC 33.1 30.0 - 36.0 g/dL   RDW 13.3 11.5 - 15.5 %   Platelets 427 (H) 150 - 400 K/uL   nRBC 0.0 0.0 - 0.2 %  Troponin I (High Sensitivity)     Status: Abnormal   Collection Time: 11/26/19 12:13 PM  Result Value Ref Range   Troponin I (High Sensitivity) 37 (H) <18 ng/L  Troponin I (High Sensitivity)     Status: Abnormal   Collection Time: 11/26/19  7:49 PM  Result Value Ref Range   Troponin I (High Sensitivity) 25 (H) <18 ng/L   ____________________________________________  EKG My review and personal interpretation at Time: 12:01   Indication:  palpitations  Rate: 100  Rhythm: afib Axis: normal Other: nonspecific t wave abn, no stemi ____________________________________________  RADIOLOGY  I personally reviewed all radiographic images ordered to evaluate for the above acute complaints and reviewed radiology reports and findings.  These findings were personally discussed with the patient.  Please see medical record for radiology report.  ____________________________________________   PROCEDURES  Procedure(s) performed:  Procedures    Critical Care performed: no ____________________________________________   INITIAL IMPRESSION / ASSESSMENT AND PLAN / ED COURSE  Pertinent labs & imaging results that were available during my care of the patient were reviewed by me and considered in my medical decision making (see chart for details).   DDX: A. fib, electrolyte abnormality, dehydration, CHF, ACS, PE  [name] is a 59 y.o. who presents to the ED with symptoms as described above.  Patient clinically very well-appearing.  She does have known history of A. fib on metoprolol and is already anticoagulated.  Not consistent with PE or CHF.  Her rates are typically in the 90s and low 100s but does appear to be symptomatic from these.  Her initial troponin mildly elevated had recent cardiac cath which was clean.  Suspect a component of strain possibly from episodes of tachydysrhythmia will monitor on telemetry and consult cardiology.  Clinical Course as of Nov 25 2128  Wed Nov 26, 2019  2047 Repeat troponin is downtrending.  Remains hemodynamically stable nontoxic-appearing.  Have consulted cardiology for medication recommendations.   [PR]  2128 Patient remains well-appearing.  Troponin downtrending.  Heart rates in the 70s to 80s with additional dose of metoprolol.  She feels well.  Discussed case in consultation with Dr. Quentin Ore of cardiology who agrees with plan for increasing her metoprolol.  Patient has follow-up in  cardiology clinic on Monday.  Have discussed with the patient and available family all diagnostics and treatments performed thus far and all questions were answered to the best of my ability. The patient demonstrates understanding and agreement with plan.    [PR]    Clinical Course User Index [PR] Merlyn Lot, MD    The patient was evaluated in Emergency Department today for the symptoms described in the history of present illness. He/she was evaluated in the context of the global COVID-19 pandemic, which necessitated consideration that the patient might be at risk for infection with the SARS-CoV-2 virus that causes COVID-19. Institutional protocols and algorithms that pertain to the evaluation of patients at risk for COVID-19 are in a state of rapid change based on information released by regulatory bodies including the CDC and federal and state organizations. These policies and algorithms were followed during the patient's care in the ED.  As part of my medical decision making, I reviewed the following data within the [place] notes reviewed and incorporated, Labs reviewed, notes from prior ED visits and [place] Controlled Substance Database   ____________________________________________   FINAL CLINICAL IMPRESSION(S) / ED DIAGNOSES  Final diagnoses:  Atrial fibrillation, unspecified type ([place])      NEW MEDICATIONS STARTED DURING THIS VISIT:  New Prescriptions   No medications on file     Note:  This document was prepared using Dragon voice recognition software and may include unintentional dictation errors.    Merlyn Lot, MD 11/26/19 2130

## 2019-11-26 NOTE — ED Triage Notes (Signed)
Pt in via [place], sent over from [facility] due to Afibb, chest pain, diaphoresis, and syncopal episode.  Pt reports syncopal episode on Sunday, going to [facility] ER but leaving prior to being seen.  Pt states Afibb is a new diagnosis within the last few months, currently on Eliquis and Metoprolol.    Vitals WDL, NAD noted at this time.

## 2019-11-26 NOTE — Discharge Instructions (Addendum)
As we discussed, please increase your metoprolol to 50 mg daily.  Please follow-up with your appointment scheduled with cardiology this coming Monday.  Return for any additional questions or concerns.

## 2019-12-01 ENCOUNTER — Ambulatory Visit: Payer: Self-pay | Admitting: Surgery

## 2019-12-01 ENCOUNTER — Other Ambulatory Visit: Payer: Self-pay

## 2019-12-01 ENCOUNTER — Ambulatory Visit (INDEPENDENT_AMBULATORY_CARE_PROVIDER_SITE_OTHER): Payer: Medicare Other | Admitting: Cardiology

## 2019-12-01 ENCOUNTER — Other Ambulatory Visit
Admission: RE | Admit: 2019-12-01 | Discharge: 2019-12-01 | Disposition: A | Discharge status: Home / Self Care | Payer: Medicare Other | Source: Ambulatory Visit | Attending: Cardiology | Admitting: Cardiology

## 2019-12-01 ENCOUNTER — Encounter: Payer: Self-pay | Admitting: Cardiology

## 2019-12-01 VITALS — BP 140/90 | HR 97 | Ht 64.0 in | Wt 148.0 lb

## 2019-12-01 DIAGNOSIS — I10 Essential (primary) hypertension: Secondary | ICD-10-CM | POA: Diagnosis not present

## 2019-12-01 DIAGNOSIS — Z20822 Contact with and (suspected) exposure to covid-19: Secondary | ICD-10-CM | POA: Insufficient documentation

## 2019-12-01 DIAGNOSIS — Z01812 Encounter for preprocedural laboratory examination: Secondary | ICD-10-CM | POA: Diagnosis not present

## 2019-12-01 DIAGNOSIS — I483 Typical atrial flutter: Secondary | ICD-10-CM | POA: Diagnosis not present

## 2019-12-01 MED ORDER — AMIODARONE HCL 200 MG PO TABS: ORAL_TABLET | ORAL | 3 refills | Status: DC

## 2019-12-01 NOTE — H&P (View-Only) (Signed)
Cardiology Office Note:    Date:  12/01/2019   ID:  [name], DOB [birth year], MRN [id number]  PCP:  Virginia Crews, MD  Cardiologist:  Kate Sable, MD  Electrophysiologist:  None   Referring MD: Virginia Crews, MD   Chief Complaint  Patient presents with  . office visit    ED F/U-Patient states she is in AFib and reports SOB x 2 weeks; Meds verbally reviewed with patient.    History of Present Illness:    [name] is a 59 y.o. female with a hx of hypertension, paroxysmal atrial fibrillation on Eliquis, asthma, anxiety, presents for follow-up.  She was seen in the ED 5 days ago due to palpitations.  EKG in the ED showed atrial flutter with variable AV block heart rate 101.  Patient states being out of breath unless allergy over the last several days.  Describes palpitations.  She has been compliant with Eliquis and Toprol-XL.  Increase dose from 25 to 50 mg.  Has taken Eliquis uninterrupted for at least 6 months.   Prior notes She was previously seen due to history of palpitations with a Holter monitor was placed and showed atrial fibrillation 3% burden.  Eliquis and Toprol-XL was started.  Echocardiogram  On 06/2019 showed normal systolic function, EF 55 to 60%, mild LA dilatation.  Left heart cath on 05/2019 did not show any significant CAD.   Past Medical History:  Diagnosis Date  . A-fib ([place]) 2021  . Abnormal EKG    HX OF INVERTED T WAVES ON EKG, PALPITATIONS, CHEST PAINS-CARDIAC WORK UP DID NOT SHOW ANY HEART DISEASE  . Acute postoperative pain 01/04/2017  . 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 Anemia    Iron Infusion-8 yrs ago  . Anxiety   . Asthma    INHALERS WITH FLARE -UPS  . Cephalalgia 08/18/2014  . Cervical disc disease 08/18/2014   Needs neck surgery.   . Chronic headaches   . DDD (degenerative disc disease)    CERVICAL AND LUMBAR-CHRONIC PAIN, RT HIP LABRAL TEAR  . Depression    PT STATES A LOT OF STRESS IN HER LIFE  . Dissociative  disorder   . Dizziness 04/22/2013  . Duodenal ulcer with hemorrhage and perforation ([place]) 04/27/2003  . GERD (gastroesophageal reflux disease)   . H/O arthrodesis 08/18/2014  . Headache(784.0)    AND NECK PAIN--STATES RECENT TEST SHOW CERVICAL DEGENERATION  . History of blood transfusion    s/p back surgery  . History of cervical spinal surgery 01/04/2015  . History of kidney stones   . Hypertension   . Inverted T wave   . Narrowing of intervertebral disc space 08/18/2014   Currently on disability.   . Orthostatic hypotension 04/22/2013  . Pain    CHRONIC NECK AND BACK PAIN - LIMITED ROM NECK - S/P FUSIONS CERVICAL AND LUMBAR  . Pneumonia   . PONV (postoperative nausea and vomiting)    PT GIVES HX OF N&V AND FEVER WITH SURGERIES YEARS AGO--BUT NO PROBLEMS WITH MORE RECENT SURGERIES--STATES NOT MALIGNANT HYPERTHERMIA  . Postop Hyponatremia 05/14/2012  . Postoperative anemia due to acute blood loss 05/14/2012  . PTSD (post-traumatic stress disorder)   . Right foot drop   . 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   . Sleep apnea 2021    Past Surgical History:  Procedure Laterality Date  . ABDOMINAL HYSTERECTOMY    . afib  05/2019  . ANTERIOR  CERVICAL DECOMP/DISCECTOMY FUSION N/A 10/30/2012   Procedure: ACDF C5-6, EXPLORATION AND HARDWARE REMOVAL C6-7;  Surgeon: Melina Schools, MD;  Location: [place];  Service: Orthopedics;  Laterality: N/A;  . ANTERIOR FUSION CERVICAL SPINE  MAY 2012   AT [facility]  . BACK SURGERY  2009   LUMBAR FUSION WITH RODS   . BREAST BIOPSY Right 2008   benign.- bx/clip  . BREAST EXCISIONAL BIOPSY Left 1998   benign  . BREAST REDUCTION SURGERY Bilateral 06/2016  . CARPAL TUNNEL RELEASE  05-06-12   Right  . CHOLECYSTECTOMY    . COLONOSCOPY WITH PROPOFOL N/A 01/26/2017   Procedure: COLONOSCOPY WITH PROPOFOL;  Surgeon: Lucilla Lame, MD;  Location: [place];  Service: Endoscopy;  Laterality: N/A;  . DIAGNOSTIC  LAPAROSCOPIES - MULTIPLE FOR ENDOMETRIOSIS    . ESOPHAGEAL DILATION N/A 01/26/2017   Procedure: ESOPHAGEAL DILATION;  Surgeon: Lucilla Lame, MD;  Location: [place];  Service: Endoscopy;  Laterality: N/A;  . ESOPHAGEAL MANOMETRY N/A 05/30/2017   Procedure: ESOPHAGEAL MANOMETRY (EM);  Surgeon: Lucilla Lame, MD;  Location: [facility];  Service: Endoscopy;  Laterality: N/A;  . ESOPHAGOGASTRODUODENOSCOPY (EGD) WITH PROPOFOL N/A 01/26/2017   Procedure: ESOPHAGOGASTRODUODENOSCOPY (EGD) WITH PROPOFOL;  Surgeon: Lucilla Lame, MD;  Location: [facility];  Service: Endoscopy;  Laterality: N/A;  . HIP ARTHROSCOPY  09/20/2011   Procedure: ARTHROSCOPY HIP;  Surgeon: Gearlean Alf, MD;  Location: [facility];  Service: Orthopedics;  Laterality: Right;  Right Hip Scope with Labral Debridement  . LEFT HEART CATH AND CORONARY ANGIOGRAPHY Left 06/10/2019   Procedure: LEFT HEART CATH AND CORONARY ANGIOGRAPHY;  Surgeon: Nelva Bush, MD;  Location: [facility];  Service: Cardiovascular;  Laterality: Left;  . NASAL SEPTUM SURGERY  MARCH 2013   IN [place]  . [place] IMPEDANCE STUDY N/A 05/30/2017   Procedure: [place] IMPEDANCE STUDY;  Surgeon: Lucilla Lame, MD;  Location: [facility];  Service: Endoscopy;  Laterality: N/A;  . POLYPECTOMY N/A 01/26/2017   Procedure: POLYPECTOMY;  Surgeon: Lucilla Lame, MD;  Location: [place];  Service: Endoscopy;  Laterality: N/A;  . POSTERIOR CERVICAL FUSION/FORAMINOTOMY N/A 04/16/2013   Procedure: REMOVAL CERVICAL PLATES AND INTERBODY CAGE/POSTERIOR CERVICAL SPINAL FUSION C4 - C6/C5 CORPECTOMY/C4 - C6 FUSION WITH ILIAC CREST BONE GRAFT;  Surgeon: Melina Schools, MD;  Location: [place];  Service: Orthopedics;  Laterality: N/A;  . RADIOFREQUENCY ABLATION NERVES    . REDUCTION MAMMAPLASTY Bilateral 05/2016  . RIGHT HIP ARTHROSCOPY FOR LABRAL TEAR  ABOUT 2010   2012 also  . SHOULDER ARTHROSCOPY  05-06-12   bone spur  . TONSILLECTOMY     AS A CHILD  .  TOTAL HIP ARTHROPLASTY Right 05/13/2012   Procedure: TOTAL HIP ARTHROPLASTY ANTERIOR APPROACH;  Surgeon: Gearlean Alf, MD;  Location[facility];  Service: Orthopedics;  Laterality: Right;    Current Medications: Current Meds  Medication Sig  . acetaminophen (TYLENOL) 500 MG tablet Take 500 mg by mouth every 6 (six) hours as needed.  Marland Kitchen albuterol (PROVENTIL HFA) 108 (90 Base) MCG/ACT inhaler Inhale 1-2 puffs into the lungs every 4 (four) hours as needed.   . ALPRAZolam (XANAX) 1 MG tablet Take 1 mg by mouth daily as needed.  Marland Kitchen apixaban (ELIQUIS) 5 MG TABS tablet Take 1 tablet (5 mg total) by mouth 2 (two) times daily.  . calcium carbonate (TUMS - DOSED IN MG ELEMENTAL CALCIUM) 500 MG chewable tablet Chew 1 tablet by mouth 3 (three) times daily with meals. As needed  .  cyclobenzaprine (FLEXERIL) 5 MG tablet Take 1 tablet (5 mg total) by mouth 3 (three) times daily as needed for muscle spasms.  . Fluticasone-Salmeterol (ADVAIR) 500-50 MCG/DOSE AEPB Inhale 1 puff into the lungs 2 (two) times daily. As needed  . metoprolol succinate (TOPROL-XL) 50 MG 24 hr tablet Take 50 mg by mouth daily. Take with or immediately following a meal.  . montelukast (SINGULAIR) 10 MG tablet TAKE 1 TABLET BY MOUTH EVERY DAY  . morphine (MSIR) 15 MG tablet Take 1 tablet (15 mg total) by mouth every 6 (six) hours as needed for moderate pain or severe pain. Must last 30 days.  Derrill Memo ON 12/23/2019] morphine (MSIR) 15 MG tablet Take 1 tablet (15 mg total) by mouth every 6 (six) hours as needed for moderate pain or severe pain. Must last 30 days.  Derrill Memo ON 01/22/2020] morphine (MSIR) 15 MG tablet Take 1 tablet (15 mg total) by mouth every 6 (six) hours as needed for moderate pain or severe pain. Must last 30 days.  . ondansetron (ZOFRAN) 4 MG tablet TAKE 1 TABLET BY MOUTH EVERY 8 HOURS AS NEEDED FOR NAUSEA AND VOMITING  . pantoprazole (PROTONIX) 40 MG tablet TAKE 1 TABLET BY MOUTH TWICE A DAY  . Spacer/Aero-Holding Chambers  (AEROCHAMBER MV) inhaler by Does not apply route.  . sucralfate (CARAFATE) 1 GM/10ML suspension Take 10 mLs (1 g total) by mouth 4 (four) times daily -  with meals and at bedtime.  . valACYclovir (VALTREX) 1000 MG tablet TAKE TWO TABLETS BY MOUTH TWICE DAILY FOR ONE DAY FEVER BLISTER     Allergies:   Amoxicillin, Chlorhexidine gluconate, Clindamycin, Codeine, Erythromycin, Penicillin g, Sulfa antibiotics, Levofloxacin, Shellfish allergy, Decadron [dexamethasone], Mangifera indica, Papaya derivatives, Betadine [povidone iodine], Chlorhexidine, Clarithromycin, Clindamycin/lincomycin, Other, Povidone-iodine, and Prednisone   Social History   Socioeconomic History  . Marital status: Married    Spouse name: bruce  . Number of children: 2  . Years of education: Not on file  . Highest education level: Associate degree: occupational, Hotel manager, or vocational program  Occupational History    Comment: disabled  Tobacco Use  . Smoking status: Never Smoker  . Smokeless tobacco: Never Used  Vaping Use  . Vaping Use: Never used  Substance and Sexual Activity  . Alcohol use: No    Alcohol/week: 0.0 standard drinks  . Drug use: No  . Sexual activity: Yes  Other Topics Concern  . Not on file  Social History Narrative   Son, daughter  And 4 grandkids; raises 3 of the grandchildren   Social Determinants of Health   Financial Resource Strain:   . Difficulty of Paying Living Expenses: Not on file  Food Insecurity: Food Insecurity Present  . Worried About Charity fundraiser in the Last Year: Sometimes true  . Ran Out of Food in the Last Year: Not on file  Transportation Needs:   . Lack of Transportation (Medical): Not on file  . Lack of Transportation (Non-Medical): Not on file  Physical Activity: Insufficiently Active  . Days of Exercise per Week: 3 days  . Minutes of Exercise per Session: 30 min  Stress:   . Feeling of Stress : Not on file  Social Connections:   . Frequency of  Communication with Friends and Family: Not on file  . Frequency of Social Gatherings with Friends and Family: Not on file  . Attends Religious Services: Not on file  . Active Member of Clubs or Organizations: Not on file  .  Attends Archivist Meetings: Not on file  . Marital Status: Not on file     Family History: The patient's family history includes Aneurysm in her maternal grandmother and mother; Anxiety disorder in her grandchild; Bipolar disorder in her grandchild and sister; Breast cancer (age of onset: 60) in her paternal grandmother; Depression in her grandchild.  ROS:   Please see the history of present illness.     All other systems reviewed and are negative.  EKGs/Labs/Other Studies Reviewed:    The following studies were reviewed today:   EKG:  EKG is  ordered today.  The ekg ordered today demonstrates typical clockwise atrial flutter, heart rate 97.  Recent Labs: 06/27/2019: ALT 11 07/10/2019: B Natriuretic Peptide 395.0; TSH 1.424 11/26/2019: BUN 10; Creatinine, Ser 0.74; Hemoglobin 13.3; Platelets 427; Potassium 3.1; Sodium 137  Recent Lipid Panel    Component Value Date/Time   CHOL 220 (H) 06/27/2019 1128   TRIG 205 (H) 06/27/2019 1128   HDL 60 06/27/2019 1128   CHOLHDL 3.7 06/27/2019 1128   LDLCALC 124 (H) 06/27/2019 1128    Physical Exam:    VS:  BP 140/90 (BP Location: Left Arm, Patient Position: Sitting, Cuff Size: Normal)   Pulse 97   Ht 5\' 4"  (1.626 m)   Wt 148 lb (67.1 kg)   SpO2 99%   BMI 25.40 kg/m     Wt Readings from Last 3 Encounters:  12/01/19 148 lb (67.1 kg)  11/26/19 147 lb (66.7 kg)  11/26/19 149 lb 6.4 oz (67.8 kg)     GEN:  Well nourished, mild distress HEENT: Normal NECK: No JVD; No carotid bruits LYMPHATICS: No lymphadenopathy CARDIAC: Irregular irregular, no murmurs, rubs, gallops RESPIRATORY:  Clear to auscultation without rales, wheezing or rhonchi  ABDOMEN: Soft, non-tender, non-distended MUSCULOSKELETAL:  No  edema; No deformity  SKIN: Warm and dry NEUROLOGIC:  Alert and oriented x 3 PSYCHIATRIC:  Normal affect   ASSESSMENT:    1. Typical atrial flutter ([place])   2. Essential hypertension    PLAN:    In order of problems listed above:  1. Typical clockwise atrial flutter, patient is symptomatic, with ECG today showing atrial flutter.  CHA2DS2-VASc score 2.  Continue Toprol-XL 50 mg daily.  Continue Eliquis.  Last echo has normal EF 55 to 60%, mild LA dilation.  She has had uninterrupted Eliquis for over 6 months.  Plan for DC cardioversion in the hospital in 2 days.  If successfully cardioverted, will start amiodarone 400 twice daily x1 week, and 200 mg twice daily after. 2. History of hypertension, blood pressure controlled.  Toprol-XL.   Follow-up after DC cardioversion.  Total encounter time 40 minutes  Greater than 50% was spent in counseling and coordination of care with the patient   This note was generated in part or whole with voice recognition software. Voice recognition is usually quite accurate but there are transcription errors that can and very often do occur. I apologize for any typographical errors that were not detected and corrected.  Medication Adjustments/Labs and Tests Ordered: Current medicines are reviewed at length with the patient today.  Concerns regarding medicines are outlined above.  Orders Placed This Encounter  Procedures  . EKG 12-Lead   Meds ordered this encounter  Medications  . amiodarone (PACERONE) 200 MG tablet    Sig: Take 2 tablets by mouth twice a day for 1 week, then take 1 tablet by mouth twice a day.    Dispense:  70 tablet  Refill:  3    Patient Instructions  Medication Instructions:   Your physician has recommended you make the following change in your medication:   After your Cardioversion on Wed. 9/22, you will start the following:  START amiodarone (PACERONE) 200 MG tablet: Take 2 tablets by mouth twice a day for 1 week, then  take 1 tablet by mouth twice a day.  *If you need a refill on your cardiac medications before your next appointment, please call your pharmacy*   Lab Work:  Please drive through the COVID testing in front of the [facility] building on your way out     Testing/Procedures:  You are scheduled for a Cardioversion on ____Wed. 9/22/21____with Dr.__Agbor-Etang_____ Please arrive at the [facility] at ____0630_____ a.m. on the day of your procedure.  DIET INSTRUCTIONS:  Nothing to eat or drink after midnight except your medications with a small sip of water.         1) Labs: _________already drawn_________  2) Medications:  YOU MAY TAKE ALL of your remaining medications with a small amount of water.  3) Must have a responsible person to drive you home.  4) Bring a current list of your medications and current insurance cards.    If you have any questions after you get home, please call the office at [phone]   Follow-Up: At [facility], you and your health needs are our priority.  As part of our continuing mission to provide you with exceptional heart care, we have created designated Provider Care Teams.  These Care Teams include your primary Cardiologist (physician) and Advanced Practice Providers (APPs -  Physician Assistants and Nurse Practitioners) who all work together to provide you with the care you need, when you need it.  We recommend signing up for the patient portal called "MyChart".  Sign up information is provided on this After Visit Summary.  MyChart is used to connect with patients for Virtual Visits (Telemedicine).  Patients are able to view lab/test results, encounter notes, upcoming appointments, etc.  Non-urgent messages can be sent to your provider as well.   To learn more about what you can do with MyChart, go to [email or web address].    Your next appointment:   2 weeks after Cardioversion on 12/03/19  The format for your next appointment:   In  Person  Provider:   Kate Sable, MD   Other Instructions      Signed, Kate Sable, MD  12/01/2019 11:19 AM    [place]

## 2019-12-01 NOTE — Progress Notes (Signed)
Cardiology Office Note:    Date:  12/01/2019   ID:  [name], DOB 23-May-1960, MRN [id number]  PCP:  Virginia Crews, MD  Cardiologist:  Kate Sable, MD  Electrophysiologist:  None   Referring MD: Virginia Crews, MD   Chief Complaint  Patient presents with  . office visit    ED F/U-Patient states she is in AFib and reports SOB x 2 weeks; Meds verbally reviewed with patient.    History of Present Illness:    [name] is a 59 y.o. female with a hx of hypertension, paroxysmal atrial fibrillation on Eliquis, asthma, anxiety, presents for follow-up.  She was seen in the ED 5 days ago due to palpitations.  EKG in the ED showed atrial flutter with variable AV block heart rate 101.  Patient states being out of breath unless allergy over the last several days.  Describes palpitations.  She has been compliant with Eliquis and Toprol-XL.  Increase dose from 25 to 50 mg.  Has taken Eliquis uninterrupted for at least 6 months.   Prior notes She was previously seen due to history of palpitations with a Holter monitor was placed and showed atrial fibrillation 3% burden.  Eliquis and Toprol-XL was started.  Echocardiogram  On 06/2019 showed normal systolic function, EF 55 to 60%, mild LA dilatation.  Left heart cath on 05/2019 did not show any significant CAD.   Past Medical History:  Diagnosis Date  . A-fib ([place]) 2021  . Abnormal EKG    HX OF INVERTED T WAVES ON EKG, PALPITATIONS, CHEST PAINS-CARDIAC WORK UP DID NOT SHOW ANY HEART DISEASE  . Acute postoperative pain 01/04/2017  . 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 Anemia    Iron Infusion-8 yrs ago  . Anxiety   . Asthma    INHALERS WITH FLARE -UPS  . Cephalalgia 08/18/2014  . Cervical disc disease 08/18/2014   Needs neck surgery.   . Chronic headaches   . DDD (degenerative disc disease)    CERVICAL AND LUMBAR-CHRONIC PAIN, RT HIP LABRAL TEAR  . Depression    PT STATES A LOT OF STRESS IN HER LIFE  . Dissociative  disorder   . Dizziness 04/22/2013  . Duodenal ulcer with hemorrhage and perforation ([place]) 04/27/2003  . GERD (gastroesophageal reflux disease)   . H/O arthrodesis 08/18/2014  . Headache(784.0)    AND NECK PAIN--STATES RECENT TEST SHOW CERVICAL DEGENERATION  . History of blood transfusion    s/p back surgery  . History of cervical spinal surgery 01/04/2015  . History of kidney stones   . Hypertension   . Inverted T wave   . Narrowing of intervertebral disc space 08/18/2014   Currently on disability.   . Orthostatic hypotension 04/22/2013  . Pain    CHRONIC NECK AND BACK PAIN - LIMITED ROM NECK - S/P FUSIONS CERVICAL AND LUMBAR  . Pneumonia   . PONV (postoperative nausea and vomiting)    PT GIVES HX OF N&V AND FEVER WITH SURGERIES YEARS AGO--BUT NO PROBLEMS WITH MORE RECENT SURGERIES--STATES NOT MALIGNANT HYPERTHERMIA  . Postop Hyponatremia 05/14/2012  . Postoperative anemia due to acute blood loss 05/14/2012  . PTSD (post-traumatic stress disorder)   . Right foot drop   . 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   . Sleep apnea 2021    Past Surgical History:  Procedure Laterality Date  . ABDOMINAL HYSTERECTOMY    . afib  05/2019  . ANTERIOR  CERVICAL DECOMP/DISCECTOMY FUSION N/A 10/30/2012   Procedure: ACDF C5-6, EXPLORATION AND HARDWARE REMOVAL C6-7;  Surgeon: Melina Schools, MD;  Location: [place];  Service: Orthopedics;  Laterality: N/A;  . ANTERIOR FUSION CERVICAL SPINE  MAY 2012   [facility]  . BACK SURGERY  2009   LUMBAR FUSION WITH RODS   . BREAST BIOPSY Right 2008   benign.- bx/clip  . BREAST EXCISIONAL BIOPSY Left 1998   benign  . BREAST REDUCTION SURGERY Bilateral 06/2016  . CARPAL TUNNEL RELEASE  05-06-12   Right  . CHOLECYSTECTOMY    . COLONOSCOPY WITH PROPOFOL N/A 01/26/2017   Procedure: COLONOSCOPY WITH PROPOFOL;  Surgeon: Lucilla Lame, MD;  Location: [place];  Service: Endoscopy;  Laterality: N/A;  . DIAGNOSTIC  LAPAROSCOPIES - MULTIPLE FOR ENDOMETRIOSIS    . ESOPHAGEAL DILATION N/A 01/26/2017   Procedure: ESOPHAGEAL DILATION;  Surgeon: Lucilla Lame, MD;  Location: [place];  Service: Endoscopy;  Laterality: N/A;  . ESOPHAGEAL MANOMETRY N/A 05/30/2017   Procedure: ESOPHAGEAL MANOMETRY (EM);  Surgeon: Lucilla Lame, MD;  Location: [facility];  Service: Endoscopy;  Laterality: N/A;  . ESOPHAGOGASTRODUODENOSCOPY (EGD) WITH PROPOFOL N/A 01/26/2017   Procedure: ESOPHAGOGASTRODUODENOSCOPY (EGD) WITH PROPOFOL;  Surgeon: Lucilla Lame, MD;  Location: [place];  Service: Endoscopy;  Laterality: N/A;  . HIP ARTHROSCOPY  09/20/2011   Procedure: ARTHROSCOPY HIP;  Surgeon: Gearlean Alf, MD;  Location: [facility];  Service: Orthopedics;  Laterality: Right;  Right Hip Scope with Labral Debridement  . LEFT HEART CATH AND CORONARY ANGIOGRAPHY Left 06/10/2019   Procedure: LEFT HEART CATH AND CORONARY ANGIOGRAPHY;  Surgeon: Nelva Bush, MD;  Location: [place] [facility];  Service: Cardiovascular;  Laterality: Left;  . NASAL SEPTUM SURGERY  MARCH 2013   IN [place]  . [place] IMPEDANCE STUDY N/A 05/30/2017   Procedure: [place] IMPEDANCE STUDY;  Surgeon: Lucilla Lame, MD;  Location: [facility];  Service: Endoscopy;  Laterality: N/A;  . POLYPECTOMY N/A 01/26/2017   Procedure: POLYPECTOMY;  Surgeon: Lucilla Lame, MD;  Location: [place];  Service: Endoscopy;  Laterality: N/A;  . POSTERIOR CERVICAL FUSION/FORAMINOTOMY N/A 04/16/2013   Procedure: REMOVAL CERVICAL PLATES AND INTERBODY CAGE/POSTERIOR CERVICAL SPINAL FUSION C4 - C6/C5 CORPECTOMY/C4 - C6 FUSION WITH ILIAC CREST BONE GRAFT;  Surgeon: Melina Schools, MD;  Location: [place];  Service: Orthopedics;  Laterality: N/A;  . RADIOFREQUENCY ABLATION NERVES    . REDUCTION MAMMAPLASTY Bilateral 05/2016  . RIGHT HIP ARTHROSCOPY FOR LABRAL TEAR  ABOUT 2010   2012 also  . SHOULDER ARTHROSCOPY  05-06-12   bone spur  . TONSILLECTOMY     AS A CHILD  .  TOTAL HIP ARTHROPLASTY Right 05/13/2012   Procedure: TOTAL HIP ARTHROPLASTY ANTERIOR APPROACH;  Surgeon: Gearlean Alf, MD;  [facility];  Service: Orthopedics;  Laterality: Right;    Current Medications: Current Meds  Medication Sig  . acetaminophen (TYLENOL) 500 MG tablet Take 500 mg by mouth every 6 (six) hours as needed.  Marland Kitchen albuterol (PROVENTIL HFA) 108 (90 Base) MCG/ACT inhaler Inhale 1-2 puffs into the lungs every 4 (four) hours as needed.   . ALPRAZolam (XANAX) 1 MG tablet Take 1 mg by mouth daily as needed.  Marland Kitchen apixaban (ELIQUIS) 5 MG TABS tablet Take 1 tablet (5 mg total) by mouth 2 (two) times daily.  . calcium carbonate (TUMS - DOSED IN MG ELEMENTAL CALCIUM) 500 MG chewable tablet Chew 1 tablet by mouth 3 (three) times daily with meals. As needed  .  cyclobenzaprine (FLEXERIL) 5 MG tablet Take 1 tablet (5 mg total) by mouth 3 (three) times daily as needed for muscle spasms.  . Fluticasone-Salmeterol (ADVAIR) 500-50 MCG/DOSE AEPB Inhale 1 puff into the lungs 2 (two) times daily. As needed  . metoprolol succinate (TOPROL-XL) 50 MG 24 hr tablet Take 50 mg by mouth daily. Take with or immediately following a meal.  . montelukast (SINGULAIR) 10 MG tablet TAKE 1 TABLET BY MOUTH EVERY DAY  . morphine (MSIR) 15 MG tablet Take 1 tablet (15 mg total) by mouth every 6 (six) hours as needed for moderate pain or severe pain. Must last 30 days.  Derrill Memo ON 12/23/2019] morphine (MSIR) 15 MG tablet Take 1 tablet (15 mg total) by mouth every 6 (six) hours as needed for moderate pain or severe pain. Must last 30 days.  Derrill Memo ON 01/22/2020] morphine (MSIR) 15 MG tablet Take 1 tablet (15 mg total) by mouth every 6 (six) hours as needed for moderate pain or severe pain. Must last 30 days.  . ondansetron (ZOFRAN) 4 MG tablet TAKE 1 TABLET BY MOUTH EVERY 8 HOURS AS NEEDED FOR NAUSEA AND VOMITING  . pantoprazole (PROTONIX) 40 MG tablet TAKE 1 TABLET BY MOUTH TWICE A DAY  . Spacer/Aero-Holding Chambers  (AEROCHAMBER MV) inhaler by Does not apply route.  . sucralfate (CARAFATE) 1 GM/10ML suspension Take 10 mLs (1 g total) by mouth 4 (four) times daily -  with meals and at bedtime.  . valACYclovir (VALTREX) 1000 MG tablet TAKE TWO TABLETS BY MOUTH TWICE DAILY FOR ONE DAY FEVER BLISTER     Allergies:   Amoxicillin, Chlorhexidine gluconate, Clindamycin, Codeine, Erythromycin, Penicillin g, Sulfa antibiotics, Levofloxacin, Shellfish allergy, Decadron [dexamethasone], Mangifera indica, Papaya derivatives, Betadine [povidone iodine], Chlorhexidine, Clarithromycin, Clindamycin/lincomycin, Other, Povidone-iodine, and Prednisone   Social History   Socioeconomic History  . Marital status: Married    Spouse name: bruce  . Number of children: 2  . Years of education: Not on file  . Highest education level: Associate degree: occupational, Hotel manager, or vocational program  Occupational History    Comment: disabled  Tobacco Use  . Smoking status: Never Smoker  . Smokeless tobacco: Never Used  Vaping Use  . Vaping Use: Never used  Substance and Sexual Activity  . Alcohol use: No    Alcohol/week: 0.0 standard drinks  . Drug use: No  . Sexual activity: Yes  Other Topics Concern  . Not on file  Social History Narrative   Son, daughter  And 4 grandkids; raises 3 of the grandchildren   Social Determinants of Health   Financial Resource Strain:   . Difficulty of Paying Living Expenses: Not on file  Food Insecurity: Food Insecurity Present  . Worried About Charity fundraiser in the Last Year: Sometimes true  . Ran Out of Food in the Last Year: Not on file  Transportation Needs:   . Lack of Transportation (Medical): Not on file  . Lack of Transportation (Non-Medical): Not on file  Physical Activity: Insufficiently Active  . Days of Exercise per Week: 3 days  . Minutes of Exercise per Session: 30 min  Stress:   . Feeling of Stress : Not on file  Social Connections:   . Frequency of  Communication with Friends and Family: Not on file  . Frequency of Social Gatherings with Friends and Family: Not on file  . Attends Religious Services: Not on file  . Active Member of Clubs or Organizations: Not on file  .  Attends Archivist Meetings: Not on file  . Marital Status: Not on file     Family History: The patient's family history includes Aneurysm in her maternal grandmother and mother; Anxiety disorder in her grandchild; Bipolar disorder in her grandchild and sister; Breast cancer (age of onset: 16) in her paternal grandmother; Depression in her grandchild.  ROS:   Please see the history of present illness.     All other systems reviewed and are negative.  EKGs/Labs/Other Studies Reviewed:    The following studies were reviewed today:   EKG:  EKG is  ordered today.  The ekg ordered today demonstrates typical clockwise atrial flutter, heart rate 97.  Recent Labs: 06/27/2019: ALT 11 07/10/2019: B Natriuretic Peptide 395.0; TSH 1.424 11/26/2019: BUN 10; Creatinine, Ser 0.74; Hemoglobin 13.3; Platelets 427; Potassium 3.1; Sodium 137  Recent Lipid Panel    Component Value Date/Time   CHOL 220 (H) 06/27/2019 1128   TRIG 205 (H) 06/27/2019 1128   HDL 60 06/27/2019 1128   CHOLHDL 3.7 06/27/2019 1128   LDLCALC 124 (H) 06/27/2019 1128    Physical Exam:    VS:  BP 140/90 (BP Location: Left Arm, Patient Position: Sitting, Cuff Size: Normal)   Pulse 97   Ht 5\' 4"  (1.626 m)   Wt 148 lb (67.1 kg)   SpO2 99%   BMI 25.40 kg/m     Wt Readings from Last 3 Encounters:  12/01/19 148 lb (67.1 kg)  11/26/19 147 lb (66.7 kg)  11/26/19 149 lb 6.4 oz (67.8 kg)     GEN:  Well nourished, mild distress HEENT: Normal NECK: No JVD; No carotid bruits LYMPHATICS: No lymphadenopathy CARDIAC: Irregular irregular, no murmurs, rubs, gallops RESPIRATORY:  Clear to auscultation without rales, wheezing or rhonchi  ABDOMEN: Soft, non-tender, non-distended MUSCULOSKELETAL:  No  edema; No deformity  SKIN: Warm and dry NEUROLOGIC:  Alert and oriented x 3 PSYCHIATRIC:  Normal affect   ASSESSMENT:    1. Typical atrial flutter ([place])   2. Essential hypertension    PLAN:    In order of problems listed above:  1. Typical clockwise atrial flutter, patient is symptomatic, with ECG today showing atrial flutter.  CHA2DS2-VASc score 2.  Continue Toprol-XL 50 mg daily.  Continue Eliquis.  Last echo has normal EF 55 to 60%, mild LA dilation.  She has had uninterrupted Eliquis for over 6 months.  Plan for DC cardioversion in the hospital in 2 days.  If successfully cardioverted, will start amiodarone 400 twice daily x1 week, and 200 mg twice daily after. 2. History of hypertension, blood pressure controlled.  Toprol-XL.   Follow-up after DC cardioversion.  Total encounter time 40 minutes  Greater than 50% was spent in counseling and coordination of care with the patient   This note was generated in part or whole with voice recognition software. Voice recognition is usually quite accurate but there are transcription errors that can and very often do occur. I apologize for any typographical errors that were not detected and corrected.  Medication Adjustments/Labs and Tests Ordered: Current medicines are reviewed at length with the patient today.  Concerns regarding medicines are outlined above.  Orders Placed This Encounter  Procedures  . EKG 12-Lead   Meds ordered this encounter  Medications  . amiodarone (PACERONE) 200 MG tablet    Sig: Take 2 tablets by mouth twice a day for 1 week, then take 1 tablet by mouth twice a day.    Dispense:  70 tablet  Refill:  3    Patient Instructions  Medication Instructions:   Your physician has recommended you make the following change in your medication:   After your Cardioversion on Wed. 9/22, you will start the following:  START amiodarone (PACERONE) 200 MG tablet: Take 2 tablets by mouth twice a day for 1 week, then  take 1 tablet by mouth twice a day.  *If you need a refill on your cardiac medications before your next appointment, please call your pharmacy*   Lab Work:  Please drive through the COVID testing in front of the [facility] on your way out     Testing/Procedures:  You are scheduled for a Cardioversion on ____Wed. 9/22/21____with Dr.__Agbor-Etang_____ Please arrive at the [place] [facility] at ____0630_____ a.m. on the day of your procedure.  DIET INSTRUCTIONS:  Nothing to eat or drink after midnight except your medications with a small sip of water.         1) Labs: _________already drawn_________  2) Medications:  YOU MAY TAKE ALL of your remaining medications with a small amount of water.  3) Must have a responsible person to drive you home.  4) Bring a current list of your medications and current insurance cards.    If you have any questions after you get home, please call the office at [phone]   Follow-Up: At [facility], you and your health needs are our priority.  As part of our continuing mission to provide you with exceptional heart care, we have created designated Provider Care Teams.  These Care Teams include your primary Cardiologist (physician) and Advanced Practice Providers (APPs -  Physician Assistants and Nurse Practitioners) who all work together to provide you with the care you need, when you need it.  We recommend signing up for the patient portal called "MyChart".  Sign up information is provided on this After Visit Summary.  MyChart is used to connect with patients for Virtual Visits (Telemedicine).  Patients are able to view lab/test results, encounter notes, upcoming appointments, etc.  Non-urgent messages can be sent to your provider as well.   To learn more about what you can do with MyChart, go to [email or web address].    Your next appointment:   2 weeks after Cardioversion on 12/03/19  The format for your next appointment:   In  Person  Provider:   Kate Sable, MD   Other Instructions      Signed, Kate Sable, MD  12/01/2019 11:19 AM    [place]

## 2019-12-01 NOTE — Patient Instructions (Signed)
Medication Instructions:   Your physician has recommended you make the following change in your medication:   After your Cardioversion on Wed. 9/22, you will start the following:  START amiodarone (PACERONE) 200 MG tablet: Take 2 tablets by mouth twice a day for 1 week, then take 1 tablet by mouth twice a day.  *If you need a refill on your cardiac medications before your next appointment, please call your pharmacy*   Lab Work:  Please drive through the COVID testing in front of the [facility] on your way out     Testing/Procedures:  You are scheduled for a Cardioversion on ____Wed. 9/22/21____with Dr.__Agbor-Etang_____ Please arrive at the [place] [facility] at ____0630_____ a.m. on the day of your procedure.  DIET INSTRUCTIONS:  Nothing to eat or drink after midnight except your medications with a small sip of water.         1) Labs: _________already drawn_________  2) Medications:  YOU MAY TAKE ALL of your remaining medications with a small amount of water.  3) Must have a responsible person to drive you home.  4) Bring a current list of your medications and current insurance cards.    If you have any questions after you get home, please call the office at [phone]   Follow-Up: At [facility], you and your health needs are our priority.  As part of our continuing mission to provide you with exceptional heart care, we have created designated Provider Care Teams.  These Care Teams include your primary Cardiologist (physician) and Advanced Practice Providers (APPs -  Physician Assistants and Nurse Practitioners) who all work together to provide you with the care you need, when you need it.  We recommend signing up for the patient portal called "MyChart".  Sign up information is provided on this After Visit Summary.  MyChart is used to connect with patients for Virtual Visits (Telemedicine).  Patients are able to view lab/test results, encounter notes,  upcoming appointments, etc.  Non-urgent messages can be sent to your provider as well.   To learn more about what you can do with MyChart, go to [email or web address].    Your next appointment:   2 weeks after Cardioversion on 12/03/19  The format for your next appointment:   In Person  Provider:   Kate Sable, MD   Other Instructions

## 2019-12-02 ENCOUNTER — Telehealth: Payer: Self-pay

## 2019-12-02 ENCOUNTER — Telehealth: Payer: Self-pay | Admitting: Cardiology

## 2019-12-02 LAB — SARS CORONAVIRUS 2 (TAT 6-24 HRS): SARS Coronavirus 2: NEGATIVE

## 2019-12-02 NOTE — Telephone Encounter (Signed)
Called and spoke to patient and gave Negative Covid results. Patient is cleared for her cardioversion tomorrow.

## 2019-12-02 NOTE — Telephone Encounter (Unsigned)
Copied from [place] [phone]. Topic: General - Inquiry >> Dec 01, 2019  4:21 PM Gillis Ends D wrote: Reason for CRM: Patient called about her referral for a Cpap machine that she was told she needed 6 months ago. She would like for someone to return her call so she can find out what's going on with the Cpap machine. She can be reached at [phone]. Please advise

## 2019-12-02 NOTE — Telephone Encounter (Signed)
Patient returning call.

## 2019-12-03 ENCOUNTER — Other Ambulatory Visit: Payer: Self-pay

## 2019-12-03 ENCOUNTER — Ambulatory Visit: Payer: Medicare Other | Admitting: Registered Nurse

## 2019-12-03 ENCOUNTER — Encounter
Admission: RE | Disposition: A | Discharge status: Home / Self Care | Payer: Self-pay | Source: Home / Self Care | Attending: Cardiology

## 2019-12-03 ENCOUNTER — Ambulatory Visit
Admission: RE | Admit: 2019-12-03 | Discharge: 2019-12-03 | Disposition: A | Discharge status: Home / Self Care | Payer: Medicare Other | Attending: Cardiology | Admitting: Cardiology

## 2019-12-03 ENCOUNTER — Encounter: Payer: Self-pay | Admitting: Cardiology

## 2019-12-03 DIAGNOSIS — Z01818 Encounter for other preprocedural examination: Secondary | ICD-10-CM | POA: Diagnosis not present

## 2019-12-03 DIAGNOSIS — I480 Paroxysmal atrial fibrillation: Secondary | ICD-10-CM | POA: Insufficient documentation

## 2019-12-03 DIAGNOSIS — Z79899 Other long term (current) drug therapy: Secondary | ICD-10-CM | POA: Insufficient documentation

## 2019-12-03 DIAGNOSIS — E7800 Pure hypercholesterolemia, unspecified: Secondary | ICD-10-CM | POA: Diagnosis not present

## 2019-12-03 DIAGNOSIS — K219 Gastro-esophageal reflux disease without esophagitis: Secondary | ICD-10-CM | POA: Insufficient documentation

## 2019-12-03 DIAGNOSIS — I4891 Unspecified atrial fibrillation: Secondary | ICD-10-CM | POA: Diagnosis not present

## 2019-12-03 DIAGNOSIS — I483 Typical atrial flutter: Secondary | ICD-10-CM

## 2019-12-03 DIAGNOSIS — G4730 Sleep apnea, unspecified: Secondary | ICD-10-CM | POA: Insufficient documentation

## 2019-12-03 DIAGNOSIS — J45909 Unspecified asthma, uncomplicated: Secondary | ICD-10-CM | POA: Insufficient documentation

## 2019-12-03 DIAGNOSIS — Z7901 Long term (current) use of anticoagulants: Secondary | ICD-10-CM | POA: Insufficient documentation

## 2019-12-03 DIAGNOSIS — F419 Anxiety disorder, unspecified: Secondary | ICD-10-CM | POA: Insufficient documentation

## 2019-12-03 DIAGNOSIS — F329 Major depressive disorder, single episode, unspecified: Secondary | ICD-10-CM | POA: Insufficient documentation

## 2019-12-03 DIAGNOSIS — I10 Essential (primary) hypertension: Secondary | ICD-10-CM | POA: Diagnosis not present

## 2019-12-03 HISTORY — PX: CARDIOVERSION: SHX1299

## 2019-12-03 SURGERY — CARDIOVERSION
Anesthesia: General

## 2019-12-03 MED ORDER — SODIUM CHLORIDE 0.9 % IV SOLN: INTRAVENOUS | Status: DC

## 2019-12-03 MED ORDER — EPHEDRINE 5 MG/ML INJ
INTRAVENOUS | Status: AC
  Filled 2019-12-03: qty 10

## 2019-12-03 MED ORDER — PHENYLEPHRINE HCL (PRESSORS) 10 MG/ML IV SOLN
INTRAVENOUS | Status: AC
  Filled 2019-12-03: qty 1

## 2019-12-03 MED ORDER — PROPOFOL 500 MG/50ML IV EMUL
INTRAVENOUS | Status: AC
  Filled 2019-12-03: qty 50

## 2019-12-03 MED ORDER — ATROPINE SULFATE 1 MG/10ML IJ SOSY
PREFILLED_SYRINGE | INTRAMUSCULAR | Status: AC
  Filled 2019-12-03: qty 10

## 2019-12-03 MED ORDER — PROPOFOL 10 MG/ML IV BOLUS
INTRAVENOUS | Status: DC | PRN
  Administered 2019-12-03: 50 mg via INTRAVENOUS

## 2019-12-03 NOTE — Procedures (Signed)
Cardioversion procedure note For atrial flutter  Procedure Details:  Consent: Risks of procedure as well as the alternatives and risks of each were explained to the (patient/caregiver).  Consent for procedure obtained.  Time Out: Verified patient identification, verified procedure, site/side was marked, verified correct patient position, special equipment/implants available, medications/allergies/relevent history reviewed, required imaging and test results available.  Performed  Patient placed on cardiac monitor, pulse oximetry, supplemental oxygen as necessary.   Sedation given by anesthesia team Pacer pads placed anterior and posterior chest.   Cardioverted 1 time.   Cardioverted at  [place]. Synchronized biphasic Converted to sinus rhythm/bradycardia, HR 50   Evaluation: Findings: Post procedure EKG shows: sinus bradycardia 50 Complications: None Patient did tolerate procedure well.  Time Spent Directly with the Patient:  35 minutes   Kate Sable, M.D.

## 2019-12-03 NOTE — Discharge Instructions (Signed)
Electrical Cardioversion Electrical cardioversion is the delivery of a jolt of electricity to restore a normal rhythm to the heart. A rhythm that is too fast or is not regular keeps the heart from pumping well. In this procedure, sticky patches or metal paddles are placed on the chest to deliver electricity to the heart from a device. This procedure may be done in an emergency if:  There is low or no blood pressure as a result of the heart rhythm.  Normal rhythm must be restored as fast as possible to protect the brain and heart from further damage.  It may save a life. This may also be a scheduled procedure for irregular or fast heart rhythms that are not immediately life-threatening. Tell a health care provider about:  Any allergies you have.  All medicines you are taking, including vitamins, herbs, eye drops, creams, and over-the-counter medicines.  Any problems you or family members have had with anesthetic medicines.  Any blood disorders you have.  Any surgeries you have had.  Any medical conditions you have.  Whether you are pregnant or may be pregnant. What are the risks? Generally, this is a safe procedure. However, problems may occur, including:  Allergic reactions to medicines.  A blood clot that breaks free and travels to other parts of your body.  The possible return of an abnormal heart rhythm within hours or days after the procedure.  Your heart stopping (cardiac arrest). This is rare. What happens before the procedure? Medicines  Your health care provider may have you start taking: ? Blood-thinning medicines (anticoagulants) so your blood does not clot as easily. ? Medicines to help stabilize your heart rate and rhythm.  Ask your health care provider about: ? Changing or stopping your regular medicines. This is especially important if you are taking diabetes medicines or blood thinners. ? Taking medicines such as aspirin and ibuprofen. These medicines can  thin your blood. Do not take these medicines unless your health care provider tells you to take them. ? Taking over-the-counter medicines, vitamins, herbs, and supplements. General instructions  Follow instructions from your health care provider about eating or drinking restrictions.  Plan to have someone take you home from the hospital or clinic.  If you will be going home right after the procedure, plan to have someone with you for 24 hours.  Ask your health care provider what steps will be taken to help prevent infection. These may include washing your skin with a germ-killing soap. What happens during the procedure?   An IV will be inserted into one of your veins.  Sticky patches (electrodes) or metal paddles may be placed on your chest.  You will be given a medicine to help you relax (sedative).  An electrical shock will be delivered. The procedure may vary among health care providers and hospitals. What can I expect after the procedure?  Your blood pressure, heart rate, breathing rate, and blood oxygen level will be monitored until you leave the hospital or clinic.  Your heart rhythm will be watched to make sure it does not change.  You may have some redness on the skin where the shocks were given. Follow these instructions at home:  Do not drive for 24 hours if you were given a sedative during your procedure.  Take over-the-counter and prescription medicines only as told by your health care provider.  Ask your health care provider how to check your pulse. Check it often.  Rest for 48 hours after the procedure or   as told by your health care provider.  Avoid or limit your caffeine use as told by your health care provider.  Keep all follow-up visits as told by your health care provider. This is important. Contact a health care provider if:  You feel like your heart is beating too quickly or your pulse is not regular.  You have a serious muscle cramp that does not go  away. Get help right away if:  You have discomfort in your chest.  You are dizzy or you feel faint.  You have trouble breathing or you are short of breath.  Your speech is slurred.  You have trouble moving an arm or leg on one side of your body.  Your fingers or toes turn cold or blue. Summary  Electrical cardioversion is the delivery of a jolt of electricity to restore a normal rhythm to the heart.  This procedure may be done right away in an emergency or may be a scheduled procedure if the condition is not an emergency.  Generally, this is a safe procedure.  After the procedure, check your pulse often as told by your health care provider. This information is not intended to replace advice given to you by your health care provider. Make sure you discuss any questions you have with your health care provider. Document Revised: 09/30/2018 Document Reviewed: 09/30/2018 Elsevier Patient Education  2020 [employer]  

## 2019-12-03 NOTE — Anesthesia Postprocedure Evaluation (Signed)
Anesthesia Post Note  Patient: MARGURETTE BRENER  Procedure(s) Performed: CARDIOVERSION (N/A )  Patient location during evaluation: Cath Lab Anesthesia Type: General Level of consciousness: awake and alert Pain management: pain level controlled Vital Signs Assessment: post-procedure vital signs reviewed and stable Respiratory status: spontaneous breathing, nonlabored ventilation, respiratory function stable and patient connected to nasal cannula oxygen Cardiovascular status: blood pressure returned to baseline and stable Postop Assessment: no apparent nausea or vomiting Anesthetic complications: no   No complications documented.   Last Vitals:  Vitals:   12/03/19 0815 12/03/19 0830  BP: 99/74 107/77  Pulse: (!) 53 (!) 51  Resp: 15 (!) 8  Temp:    SpO2: 98% 97%    Last Pain:  Vitals:   12/03/19 0830  TempSrc:   PainSc: 0-No pain                 Precious Haws Kimberlly Norgard

## 2019-12-03 NOTE — Interval H&P Note (Signed)
History and Physical Interval Note:  12/03/2019 7:32 AM  [name]  has presented today for elective procedure, with the diagnosis of atrial flutter.  The various methods of treatment have been discussed with the patient and family. After consideration of risks, benefits and other options for treatment, the patient has consented to  Procedure(s): CARDIOVERSION (N/A) as an intervention.  The patient's history has been reviewed, patient examined, no change in status, stable for surgery.  I have reviewed the patient's chart and labs.  Questions were answered to the patient's satisfaction.     Aaron Edelman [place]ang

## 2019-12-03 NOTE — Transfer of Care (Signed)
Immediate Anesthesia Transfer of Care Note  Patient: SAMARIA ANES  Procedure(s) Performed: CARDIOVERSION (N/A )  Patient Location: PACU and Cath Lab  Anesthesia Type:General  Level of Consciousness: drowsy  Airway & Oxygen Therapy: Patient Spontanous Breathing and Patient connected to nasal cannula oxygen  Post-op Assessment: Report given to RN and Post -op Vital signs reviewed and stable  Post vital signs: Reviewed and stable  Last Vitals:  Vitals Value Taken Time  BP 85/66 (74) 12/03/19 0750  Temp    Pulse 49 12/03/19 0749  Resp 9 12/03/19 0749  SpO2 100 % 12/03/19 0749  Vitals shown include unvalidated device data.  Last Pain:  Vitals:   12/03/19 0717  TempSrc: Oral  PainSc: 0-No pain         Complications: No complications documented.

## 2019-12-03 NOTE — Anesthesia Preprocedure Evaluation (Signed)
Anesthesia Evaluation  Patient identified by MRN, date of birth, ID band Patient awake    Reviewed: Allergy & Precautions, H&P , NPO status , Patient's Chart, lab work & pertinent test results  History of Anesthesia Complications (+) PONV and history of anesthetic complications  Airway Mallampati: III  TM Distance: <3 FB Neck ROM: limited    Dental  (+) Chipped   Pulmonary asthma , sleep apnea , pneumonia,    Pulmonary exam normal        Cardiovascular Exercise Tolerance: Good hypertension, (-) angina(-) DOE Normal cardiovascular exam     Neuro/Psych  Headaches, PSYCHIATRIC DISORDERS  Neuromuscular disease    GI/Hepatic Neg liver ROS, PUD, GERD  ,  Endo/Other  negative endocrine ROS  Renal/GU Renal disease  negative genitourinary   Musculoskeletal  (+) Arthritis ,   Abdominal   Peds  Hematology negative hematology ROS (+)   Anesthesia Other Findings Past Medical History: 2021: A-fib ([place]) No date: Abnormal EKG     Comment:  HX OF INVERTED T WAVES ON EKG, PALPITATIONS, CHEST               PAINS-CARDIAC WORK UP DID NOT SHOW ANY HEART DISEASE 01/04/2017: Acute postoperative pain 08/15/2004: [place] anemia No date: Anemia     Comment:  Iron Infusion-8 yrs ago No date: Anxiety No date: Asthma     Comment:  INHALERS WITH FLARE -UPS 08/18/2014: Cephalalgia 08/18/2014: Cervical disc disease     Comment:  Needs neck surgery.  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  04/22/2013: Orthostatic hypotension No date: Pain     Comment:  CHRONIC NECK AND BACK PAIN - LIMITED ROM NECK - S/P               FUSIONS CERVICAL AND LUMBAR No date: Pneumonia No date: PONV (postoperative nausea and vomiting)     Comment:  PT GIVES HX OF N&V AND FEVER WITH SURGERIES YEARS               AGO--BUT NO PROBLEMS WITH MORE RECENT SURGERIES--STATES               NOT MALIGNANT HYPERTHERMIA 05/14/2012: Postop Hyponatremia 05/14/2012: Postoperative anemia due to acute blood loss No date: PTSD (post-traumatic stress disorder) No date: Right foot drop 08/18/2014: Right hip arthralgia     Comment:  Status post surgery of right, and now needs left.  2021: Sleep apnea  Past Surgical History: No date: ABDOMINAL HYSTERECTOMY 05/2019: afib 10/30/2012: ANTERIOR CERVICAL DECOMP/DISCECTOMY FUSION; N/A     Comment:  Procedure: ACDF C5-6, EXPLORATION AND HARDWARE REMOVAL               C6-7;  Surgeon: Melina Schools, MD;  Location: [place];                Service: Orthopedics;  Laterality: N/A; MAY 2012: ANTERIOR FUSION CERVICAL SPINE     Comment:  [facility] 2009: BACK SURGERY     Comment:  [place] WITH RODS  2008: BREAST BIOPSY; Right     Comment:  benign.- bx/clip 1998: BREAST EXCISIONAL BIOPSY; Left     Comment:  benign 06/2016: BREAST REDUCTION SURGERY; Bilateral 05-06-12: CARPAL TUNNEL RELEASE     Comment:  Right No date: CHOLECYSTECTOMY 01/26/2017: COLONOSCOPY WITH PROPOFOL; N/A     Comment:  Procedure: COLONOSCOPY WITH PROPOFOL;  Surgeon: Lucilla Lame, MD;  Location: [place];  Service:               Endoscopy;  Laterality: N/A; No date: DIAGNOSTIC  LAPAROSCOPIES - MULTIPLE FOR ENDOMETRIOSIS 01/26/2017: ESOPHAGEAL DILATION; N/A     Comment:  Procedure: ESOPHAGEAL DILATION;  Surgeon: Lucilla Lame,               MD;  Location: [place];  Service: Endoscopy;               Laterality: N/A; 05/30/2017: ESOPHAGEAL MANOMETRY; N/A     Comment:  Procedure: ESOPHAGEAL MANOMETRY (EM);  Surgeon: Lucilla Lame, MD;  Location: [facility];  Service:               Endoscopy;  Laterality: N/A; 01/26/2017: ESOPHAGOGASTRODUODENOSCOPY (EGD) WITH PROPOFOL; N/A     Comment:  Procedure: ESOPHAGOGASTRODUODENOSCOPY (EGD) WITH               PROPOFOL;  Surgeon: Lucilla Lame, MD;  Location: [place];  Service: Endoscopy;  Laterality: N/A; 09/20/2011: HIP ARTHROSCOPY     Comment:  Procedure: ARTHROSCOPY HIP;  Surgeon: Gearlean Alf,               MD;  Location: [facility];  Service: Orthopedics;                Laterality: Right;  Right Hip Scope with Labral               Debridement 06/10/2019: LEFT HEART CATH AND CORONARY ANGIOGRAPHY; Left     Comment:  Procedure: LEFT HEART CATH AND CORONARY ANGIOGRAPHY;                Surgeon: Nelva Bush, MD;  Location: [place]               CV [facility];  Service: Cardiovascular;  Laterality: Left; MARCH 2013: NASAL SEPTUM SURGERY     Comment:  IN  05/30/2017: PH IMPEDANCE STUDY; N/A     Comment:  Procedure: [place] IMPEDANCE STUDY;  Surgeon: Lucilla Lame,               MD;  Location: [facility];  Service: Endoscopy;                Laterality: N/A; 01/26/2017: POLYPECTOMY; N/A     Comment:  Procedure: POLYPECTOMY;  Surgeon: Lucilla Lame, MD;                Location: [place];  Service: Endoscopy;  Laterality: N/A; 04/16/2013: POSTERIOR CERVICAL FUSION/FORAMINOTOMY; N/A     Comment:  Procedure: REMOVAL CERVICAL PLATES AND INTERBODY               CAGE/POSTERIOR CERVICAL SPINAL FUSION C4 - C6/C5               CORPECTOMY/C4 - C6 FUSION  WITH ILIAC CREST BONE GRAFT;                Surgeon: Melina Schools, MD;  Location: [place];  Service:               Orthopedics;  Laterality: N/A; No date: RADIOFREQUENCY ABLATION NERVES 05/2016: REDUCTION MAMMAPLASTY; Bilateral ABOUT 2010: RIGHT HIP ARTHROSCOPY FOR LABRAL TEAR     Comment:  2012 also 05-06-12: SHOULDER ARTHROSCOPY     Comment:  bone spur No date: TONSILLECTOMY     Comment:  AS A CHILD 05/13/2012: TOTAL HIP ARTHROPLASTY; Right     Comment:  Procedure: TOTAL HIP ARTHROPLASTY ANTERIOR APPROACH;                Surgeon: Gearlean Alf, MD;  Location: [facility];                Service: Orthopedics;  Laterality: Right;  BMI    Body Mass Index: 25.23 kg/m      Reproductive/Obstetrics negative OB ROS                             Anesthesia Physical Anesthesia Plan  ASA: III  Anesthesia Plan: General   Post-op Pain Management:    Induction: Intravenous  PONV Risk Score and Plan: Propofol infusion and TIVA  Airway Management Planned: Natural Airway and Nasal Cannula  Additional Equipment:   Intra-op Plan:   Post-operative Plan:   Informed Consent: I have reviewed the patients History and Physical, chart, labs and discussed the procedure including the risks, benefits and alternatives for the proposed anesthesia with the patient or authorized representative who has indicated his/her understanding and acceptance.     Dental Advisory Given  Plan Discussed with: Anesthesiologist, CRNA and Surgeon  Anesthesia Plan Comments: (Patient consented for risks of anesthesia including but not limited to:  - adverse reactions to medications - risk of intubation if required - damage to eyes, teeth, lips or other oral mucosa - nerve damage due to positioning  - sore throat or hoarseness - Damage to heart, brain, nerves, lungs, other parts of body or loss of life  Patient voiced understanding.)        Anesthesia Quick Evaluation

## 2019-12-04 ENCOUNTER — Telehealth: Payer: Self-pay | Admitting: Cardiology

## 2019-12-04 ENCOUNTER — Encounter: Payer: Self-pay | Admitting: Cardiology

## 2019-12-04 NOTE — Telephone Encounter (Signed)
Patient states she was prescribed Amiodarone. States she is allergic to iodine, which is in this medication. States she has taken 2, but hasnt taken any since then. Please call to discuss.

## 2019-12-04 NOTE — Telephone Encounter (Signed)
Can you please evaluate this patients concern.

## 2019-12-04 NOTE — Telephone Encounter (Signed)
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  In a retrospective review 95 people who had allergy listed to iodine or iodinated radiocontrast in their chart received oral or IV amiodarone. Only 1 patient had a probably reaction to iodine. Considering patient took 2 doses already and has not had a reaction, and the above information, I feel it is safe to continue amiodarone.  I will let Dr. Garen Lah comment as well

## 2019-12-04 NOTE — Telephone Encounter (Signed)
Called patient back and relayed the information from the pharmacist, as well as Dr. Thereasa Solo recommendation as copied below. Patient was very grateful for the information and will continue taking the Amiodarone.  Please have patient continue amiodarone as prescribed. I agree with suggestions/recommendation from clinical pharmacist. Thank you for the input. Keep follow-up appointment with myself. Thank you

## 2019-12-05 ENCOUNTER — Telehealth: Payer: Self-pay

## 2019-12-05 NOTE — Telephone Encounter (Unsigned)
Copied from [place] [phone]. Topic: General - Other >> Dec 05, 2019 11:13 AM Alanda Slim E wrote: Reason for CRM: Pt stated that she has been waiting for month for Cpap and still has not received or heard anything/ Please call Pt and advise asap

## 2019-12-15 ENCOUNTER — Ambulatory Visit: Payer: Self-pay | Admitting: Surgery

## 2019-12-19 ENCOUNTER — Ambulatory Visit: Payer: Medicare Other | Admitting: Cardiology

## 2019-12-19 ENCOUNTER — Encounter: Payer: Self-pay | Admitting: Cardiology

## 2019-12-19 ENCOUNTER — Other Ambulatory Visit: Payer: Self-pay | Admitting: Family Medicine

## 2019-12-19 ENCOUNTER — Other Ambulatory Visit: Payer: Self-pay

## 2019-12-19 VITALS — BP 138/98 | HR 62 | Ht 64.0 in | Wt 144.0 lb

## 2019-12-19 DIAGNOSIS — I10 Essential (primary) hypertension: Secondary | ICD-10-CM | POA: Diagnosis not present

## 2019-12-19 DIAGNOSIS — Z79899 Other long term (current) drug therapy: Secondary | ICD-10-CM | POA: Diagnosis not present

## 2019-12-19 DIAGNOSIS — I4892 Unspecified atrial flutter: Secondary | ICD-10-CM

## 2019-12-19 MED ORDER — AMIODARONE HCL 200 MG PO TABS: 200.0000 mg | ORAL_TABLET | Freq: Every day | ORAL | 5 refills | Status: DC

## 2019-12-19 NOTE — Progress Notes (Signed)
Cardiology Office Note:    Date:  12/19/2019   ID:  [name], DOB [birth year], MRN [id number]  PCP:  Virginia Crews, MD  Cardiologist:  Kate Sable, MD  Electrophysiologist:  None   Referring MD: Virginia Crews, MD   Chief Complaint  Patient presents with  . Follow-up    2 Week follow up, S/P Cardioversion. Per patient she is still having SOB and feeling tired. Medications verbally reviewed with patient.     History of Present Illness:    [name] is a 59 y.o. female with a hx of hypertension, paroxysmal atrial fibrillation/atrial flutter status post cardioversion 12/03/2019 on Eliquis, asthma, anxiety, presents for follow-up.  Patient noted to have symptomatic atrial flutter after last visit.  Cardioversion was successfully performed in the hospital on 12/03/2019.  Patient was started on amiodarone after.  Currently feels better compared to precardioversion.  Has reflux symptoms, surgery is being planned to address this at some point.  Has nausea, decreased appetite, not sure if her reflux symptoms are contributing.    Prior notes She was previously seen due to history of palpitations with a Holter monitor was placed and showed atrial fibrillation 3% burden.  Eliquis and Toprol-XL was started.  Echocardiogram  On 06/2019 showed normal systolic function, EF 55 to 60%, mild LA dilatation.  Left heart cath on 05/2019 did not show any significant CAD.   Past Medical History:  Diagnosis Date  . A-fib ([place]) 2021  . Abnormal EKG    HX OF INVERTED T WAVES ON EKG, PALPITATIONS, CHEST PAINS-CARDIAC WORK UP DID NOT SHOW ANY HEART DISEASE  . Acute postoperative pain 01/04/2017  . 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 Anemia    Iron Infusion-8 yrs ago  . Anxiety   . Asthma    INHALERS WITH FLARE -UPS  . Cephalalgia 08/18/2014  . Cervical disc disease 08/18/2014   Needs neck surgery.   . Chronic headaches   . DDD (degenerative disc disease)    CERVICAL AND LUMBAR-CHRONIC  PAIN, RT HIP LABRAL TEAR  . Depression    PT STATES A LOT OF STRESS IN HER LIFE  . Dissociative disorder   . Dizziness 04/22/2013  . Duodenal ulcer with hemorrhage and perforation ([place]) 04/27/2003  . GERD (gastroesophageal reflux disease)   . H/O arthrodesis 08/18/2014  . Headache(784.0)    AND NECK PAIN--STATES RECENT TEST SHOW CERVICAL DEGENERATION  . History of blood transfusion    s/p back surgery  . History of cervical spinal surgery 01/04/2015  . History of kidney stones   . Hypertension   . Inverted T wave   . Narrowing of intervertebral disc space 08/18/2014   Currently on disability.   . Orthostatic hypotension 04/22/2013  . Pain    CHRONIC NECK AND BACK PAIN - LIMITED ROM NECK - S/P FUSIONS CERVICAL AND LUMBAR  . Pneumonia   . PONV (postoperative nausea and vomiting)    PT GIVES HX OF N&V AND FEVER WITH SURGERIES YEARS AGO--BUT NO PROBLEMS WITH MORE RECENT SURGERIES--STATES NOT MALIGNANT HYPERTHERMIA  . Postop Hyponatremia 05/14/2012  . Postoperative anemia due to acute blood loss 05/14/2012  . PTSD (post-traumatic stress disorder)   . Right foot drop   . 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   . Sleep apnea 2021    Past Surgical History:  Procedure Laterality Date  . ABDOMINAL HYSTERECTOMY    . afib  05/2019  . ANTERIOR CERVICAL  DECOMP/DISCECTOMY FUSION N/A 10/30/2012   Procedure: ACDF C5-6, EXPLORATION AND HARDWARE REMOVAL C6-7;  Surgeon: Melina Schools, MD;  Location: [place];  Service: Orthopedics;  Laterality: N/A;  . ANTERIOR FUSION CERVICAL SPINE  MAY 2012   AT [facility]  . BACK SURGERY  2009   LUMBAR FUSION WITH RODS   . BREAST BIOPSY Right 2008   benign.- bx/clip  . BREAST EXCISIONAL BIOPSY Left 1998   benign  . BREAST REDUCTION SURGERY Bilateral 06/2016  . CARDIOVERSION N/A 12/03/2019   Procedure: CARDIOVERSION;  Surgeon: Kate Sable, MD;  Location: [facility];  Service: Cardiovascular;  Laterality: N/A;  . CARPAL  TUNNEL RELEASE  05-06-12   Right  . CHOLECYSTECTOMY    . COLONOSCOPY WITH PROPOFOL N/A 01/26/2017   Procedure: COLONOSCOPY WITH PROPOFOL;  Surgeon: Lucilla Lame, MD;  Location: [place];  Service: Endoscopy;  Laterality: N/A;  . DIAGNOSTIC LAPAROSCOPIES - MULTIPLE FOR ENDOMETRIOSIS    . ESOPHAGEAL DILATION N/A 01/26/2017   Procedure: ESOPHAGEAL DILATION;  Surgeon: Lucilla Lame, MD;  Location: [place];  Service: Endoscopy;  Laterality: N/A;  . ESOPHAGEAL MANOMETRY N/A 05/30/2017   Procedure: ESOPHAGEAL MANOMETRY (EM);  Surgeon: Lucilla Lame, MD;  Location: [facility];  Service: Endoscopy;  Laterality: N/A;  . ESOPHAGOGASTRODUODENOSCOPY (EGD) WITH PROPOFOL N/A 01/26/2017   Procedure: ESOPHAGOGASTRODUODENOSCOPY (EGD) WITH PROPOFOL;  Surgeon: Lucilla Lame, MD;  Location: [place];  Service: Endoscopy;  Laterality: N/A;  . HIP ARTHROSCOPY  09/20/2011   Procedure: ARTHROSCOPY HIP;  Surgeon: Gearlean Alf, MD;  [facility];  Service: Orthopedics;  Laterality: Right;  Right Hip Scope with Labral Debridement  . LEFT HEART CATH AND CORONARY ANGIOGRAPHY Left 06/10/2019   Procedure: LEFT HEART CATH AND CORONARY ANGIOGRAPHY;  Surgeon: Nelva Bush, MD;  Location: [place] [facility];  Service: Cardiovascular;  Laterality: Left;  . NASAL SEPTUM SURGERY  MARCH 2013   IN [place]  . [place] IMPEDANCE STUDY N/A 05/30/2017   Procedure: [place] IMPEDANCE STUDY;  Surgeon: Lucilla Lame, MD;  Location: [facility];  Service: Endoscopy;  Laterality: N/A;  . POLYPECTOMY N/A 01/26/2017   Procedure: POLYPECTOMY;  Surgeon: Lucilla Lame, MD;  Location: [place];  Service: Endoscopy;  Laterality: N/A;  . POSTERIOR CERVICAL FUSION/FORAMINOTOMY N/A 04/16/2013   Procedure: REMOVAL CERVICAL PLATES AND INTERBODY CAGE/POSTERIOR CERVICAL SPINAL FUSION C4 - C6/C5 CORPECTOMY/C4 - C6 FUSION WITH ILIAC CREST BONE GRAFT;  Surgeon: Melina Schools, MD;  Location: [place];  Service:  Orthopedics;  Laterality: N/A;  . RADIOFREQUENCY ABLATION NERVES    . REDUCTION MAMMAPLASTY Bilateral 05/2016  . RIGHT HIP ARTHROSCOPY FOR LABRAL TEAR  ABOUT 2010   2012 also  . SHOULDER ARTHROSCOPY  05-06-12   bone spur  . TONSILLECTOMY     AS A CHILD  . TOTAL HIP ARTHROPLASTY Right 05/13/2012   Procedure: TOTAL HIP ARTHROPLASTY ANTERIOR APPROACH;  Surgeon: Gearlean Alf, MD;  [facility];  Service: Orthopedics;  Laterality: Right;    Current Medications: Current Meds  Medication Sig  . acetaminophen (TYLENOL) 500 MG tablet Take 500-1,000 mg by mouth every 6 (six) hours as needed for moderate pain.   Marland Kitchen albuterol (PROVENTIL HFA) 108 (90 Base) MCG/ACT inhaler Inhale 1-2 puffs into the lungs every 4 (four) hours as needed for wheezing or shortness of breath.   . ALPRAZolam (XANAX) 1 MG tablet Take 1 mg by mouth at bedtime.   Marland Kitchen amiodarone (PACERONE) 200 MG tablet Take 1 tablet (200 mg total) by mouth daily.  Marland Kitchen  apixaban (ELIQUIS) 5 MG TABS tablet Take 1 tablet (5 mg total) by mouth 2 (two) times daily.  . calcium carbonate (TUMS - DOSED IN MG ELEMENTAL CALCIUM) 500 MG chewable tablet Chew 4 tablets by mouth daily as needed for heartburn.   . cyclobenzaprine (FLEXERIL) 5 MG tablet Take 1 tablet (5 mg total) by mouth 3 (three) times daily as needed for muscle spasms.  . Fluticasone-Salmeterol (ADVAIR) 500-50 MCG/DOSE AEPB Inhale 1 puff into the lungs 2 (two) times daily as needed (cold symptoms).   . Menthol, Topical Analgesic, (BENGAY EX) Apply 1 application topically daily as needed (pain).  . montelukast (SINGULAIR) 10 MG tablet TAKE 1 TABLET BY MOUTH EVERY DAY (Patient taking differently: Take 10 mg by mouth daily. )  . morphine (MSIR) 15 MG tablet Take 1 tablet (15 mg total) by mouth every 6 (six) hours as needed for moderate pain or severe pain. Must last 30 days.  Derrill Memo ON 12/23/2019] morphine (MSIR) 15 MG tablet Take 1 tablet (15 mg total) by mouth every 6 (six) hours as needed  for moderate pain or severe pain. Must last 30 days.  Derrill Memo ON 01/22/2020] morphine (MSIR) 15 MG tablet Take 1 tablet (15 mg total) by mouth every 6 (six) hours as needed for moderate pain or severe pain. Must last 30 days.  Marland Kitchen oxymetazoline (AFRIN) 0.05 % nasal spray Place 1 spray into both nostrils 2 (two) times daily as needed for congestion.  . pantoprazole (PROTONIX) 40 MG tablet TAKE 1 TABLET BY MOUTH TWICE A DAY (Patient taking differently: Take 40 mg by mouth 2 (two) times daily. )  . Spacer/Aero-Holding Chambers (AEROCHAMBER MV) inhaler by Does not apply route.  . valACYclovir (VALTREX) 1000 MG tablet TAKE TWO TABLETS BY MOUTH TWICE DAILY FOR ONE DAY FEVER BLISTER (Patient taking differently: Take 2,000 mg by mouth 2 (two) times daily as needed (fever blisters). )  . [DISCONTINUED] amiodarone (PACERONE) 200 MG tablet Take 2 tablets by mouth twice a day for 1 week, then take 1 tablet by mouth twice a day. (Patient taking differently: Take 200-400 mg by mouth See admin instructions. Take 400 by mouth twice a day for 1 week, then take 200 by mouth twice a day.)  . [DISCONTINUED] ondansetron (ZOFRAN) 4 MG tablet TAKE 1 TABLET BY MOUTH EVERY 8 HOURS AS NEEDED FOR NAUSEA AND VOMITING (Patient taking differently: Take 4 mg by mouth every 8 (eight) hours as needed for nausea or vomiting. )     Allergies:   Amoxicillin, Chlorhexidine gluconate, Clindamycin, Codeine, Erythromycin, Penicillin g, Sulfa antibiotics, Levofloxacin, Shellfish allergy, Decadron [dexamethasone], Mangifera indica, Papaya derivatives, Betadine [povidone iodine], Clarithromycin, Other, Povidone-iodine, and Prednisone   Social History   Socioeconomic History  . Marital status: Married    Spouse name: bruce  . Number of children: 2  . Years of education: Not on file  . Highest education level: Associate degree: occupational, Hotel manager, or vocational program  Occupational History    Comment: disabled  Tobacco Use  .  Smoking status: Never Smoker  . Smokeless tobacco: Never Used  Vaping Use  . Vaping Use: Never used  Substance and Sexual Activity  . Alcohol use: No    Alcohol/week: 0.0 standard drinks  . Drug use: No  . Sexual activity: Yes  Other Topics Concern  . Not on file  Social History Narrative   Son, daughter  And 4 grandkids; raises 3 of the grandchildren   Social Determinants of Radio broadcast assistant  Strain:   . Difficulty of Paying Living Expenses: Not on file  Food Insecurity: Food Insecurity Present  . Worried About Charity fundraiser in the Last Year: Sometimes true  . Ran Out of Food in the Last Year: Not on file  Transportation Needs:   . Lack of Transportation (Medical): Not on file  . Lack of Transportation (Non-Medical): Not on file  Physical Activity: Insufficiently Active  . Days of Exercise per Week: 3 days  . Minutes of Exercise per Session: 30 min  Stress:   . Feeling of Stress : Not on file  Social Connections:   . Frequency of Communication with Friends and Family: Not on file  . Frequency of Social Gatherings with Friends and Family: Not on file  . Attends Religious Services: Not on file  . Active Member of Clubs or Organizations: Not on file  . Attends Archivist Meetings: Not on file  . Marital Status: Not on file     Family History: The patient's family history includes Aneurysm in her maternal grandmother and mother; Anxiety disorder in her grandchild; Bipolar disorder in her grandchild and sister; Breast cancer (age of onset: 53) in her paternal grandmother; Depression in her grandchild.  ROS:   Please see the history of present illness.     All other systems reviewed and are negative.  EKGs/Labs/Other Studies Reviewed:    The following studies were reviewed today:   EKG:  EKG is  ordered today.  The ekg ordered today demonstrates sinus rhythm, heart rate 62 per  Recent Labs: 06/27/2019: ALT 11 07/10/2019: B Natriuretic Peptide  395.0; TSH 1.424 11/26/2019: BUN 10; Creatinine, Ser 0.74; Hemoglobin 13.3; Platelets 427; Potassium 3.1; Sodium 137  Recent Lipid Panel    Component Value Date/Time   CHOL 220 (H) 06/27/2019 1128   TRIG 205 (H) 06/27/2019 1128   HDL 60 06/27/2019 1128   CHOLHDL 3.7 06/27/2019 1128   LDLCALC 124 (H) 06/27/2019 1128    Physical Exam:    VS:  BP (!) 138/98 (BP Location: Left Arm, Patient Position: Sitting, Cuff Size: Normal)   Pulse 62   Ht 5\' 4"  (1.626 m)   Wt 144 lb (65.3 kg)   SpO2 98%   BMI 24.72 kg/m     Wt Readings from Last 3 Encounters:  12/19/19 144 lb (65.3 kg)  12/03/19 147 lb (66.7 kg)  12/01/19 148 lb (67.1 kg)     GEN:  Well nourished, mild distress HEENT: Normal NECK: No JVD; No carotid bruits LYMPHATICS: No lymphadenopathy CARDIAC: RRR, no murmurs, rubs, gallops RESPIRATORY:  Clear to auscultation without rales, wheezing or rhonchi  ABDOMEN: Soft, non-tender, non-distended MUSCULOSKELETAL:  No edema; No deformity  SKIN: Warm and dry NEUROLOGIC:  Alert and oriented x 3 PSYCHIATRIC:  Normal affect   ASSESSMENT:    1. Atrial flutter, unspecified type ([place])   2. Essential hypertension    PLAN:    In order of problems listed above:  1. Patient with history of atrial flutter, status post cardioversion, maintaining sinus rhythm.  CHA2DS2-VASc score 3 (Age, gender, htn). continue Eliquis 5 mg twice daily, decrease amiodarone to 200 mg daily.  Last echo has normal EF 55 to 60%, mild LA dilation. 2. History of hypertension, blood pressure controlled.  Continue amiodarone 200 mg daily.  BP controlled  Follow-up in 3 months.  Total encounter time 40 minutes  Greater than 50% was spent in counseling and coordination of care with the patient   This  note was generated in part or whole with voice recognition software. Voice recognition is usually quite accurate but there are transcription errors that can and very often do occur. I apologize for any typographical  errors that were not detected and corrected.  Medication Adjustments/Labs and Tests Ordered: Current medicines are reviewed at length with the patient today.  Concerns regarding medicines are outlined above.  Orders Placed This Encounter  Procedures  . EKG 12-Lead   Meds ordered this encounter  Medications  . amiodarone (PACERONE) 200 MG tablet    Sig: Take 1 tablet (200 mg total) by mouth daily.    Dispense:  30 tablet    Refill:  5    Patient Instructions  Medication Instructions:  Your physician has recommended you make the following change in your medication:   DECREASE your amiodarone (PACERONE) 200 MG tablet to: Take 1 tablet (200 mg total) by mouth daily.  *If you need a refill on your cardiac medications before your next appointment, please call your pharmacy*   Lab Work: None Ordered If you have labs (blood work) drawn today and your tests are completely normal, you will receive your results only by: Marland Kitchen MyChart Message (if you have MyChart) OR . A paper copy in the mail If you have any lab test that is abnormal or we need to change your treatment, we will call you to review the results.   Testing/Procedures: None Ordered   Follow-Up: At [facility], you and your health needs are our priority.  As part of our continuing mission to provide you with exceptional heart care, we have created designated Provider Care Teams.  These Care Teams include your primary Cardiologist (physician) and Advanced Practice Providers (APPs -  Physician Assistants and Nurse Practitioners) who all work together to provide you with the care you need, when you need it.  We recommend signing up for the patient portal called "MyChart".  Sign up information is provided on this After Visit Summary.  MyChart is used to connect with patients for Virtual Visits (Telemedicine).  Patients are able to view lab/test results, encounter notes, upcoming appointments, etc.  Non-urgent messages can be sent  to your provider as well.   To learn more about what you can do with MyChart, go to [email or web address].    Your next appointment:   3 month(s)  The format for your next appointment:   In Person  Provider:   Kate Sable, MD   Other Instructions      Signed, Kate Sable, MD  12/19/2019 1:00 PM    [place]

## 2019-12-19 NOTE — Telephone Encounter (Signed)
Requested medication (s) are due for refill today: Yes  Requested medication (s) are on the active medication list: Yes  Last refill:  06/23/19  Future visit scheduled: Yes  Notes to clinic:  See request.    Requested Prescriptions  Pending Prescriptions Disp Refills   ondansetron (ZOFRAN) 4 MG tablet [Pharmacy Med Name: ONDANSETRON HCL 4 MG TABLET] 20 tablet 1    Sig: TAKE 1 TABLET BY MOUTH EVERY 8 HOURS AS NEEDED FOR NAUSEA AND VOMITING      Not Delegated - Gastroenterology: Antiemetics Failed - 12/19/2019  8:46 AM      Failed - This refill cannot be delegated      Passed - Valid encounter within last 6 months    Recent Outpatient Visits           3 weeks ago Heart palpitations   [place], [place], PA-C   1 month ago Gastroesophageal reflux disease with esophagitis without hemorrhage   [facility] [place], Dionne Bucy, MD   5 months ago Essential hypertension   [facility] [place], Dionne Bucy, MD   8 months ago Dysuria   [facility], Dionne Bucy, MD   11 months ago Essential hypertension   [place], Dionne Bucy, MD       Future Appointments             Today Kate Sable, MD [facility], [place]

## 2019-12-19 NOTE — Patient Instructions (Signed)
Medication Instructions:  Your physician has recommended you make the following change in your medication:   DECREASE your amiodarone (PACERONE) 200 MG tablet to: Take 1 tablet (200 mg total) by mouth daily.  *If you need a refill on your cardiac medications before your next appointment, please call your pharmacy*   Lab Work: None Ordered If you have labs (blood work) drawn today and your tests are completely normal, you will receive your results only by: Marland Kitchen MyChart Message (if you have MyChart) OR . A paper copy in the mail If you have any lab test that is abnormal or we need to change your treatment, we will call you to review the results.   Testing/Procedures: None Ordered   Follow-Up: At [facility], you and your health needs are our priority.  As part of our continuing mission to provide you with exceptional heart care, we have created designated Provider Care Teams.  These Care Teams include your primary Cardiologist (physician) and Advanced Practice Providers (APPs -  Physician Assistants and Nurse Practitioners) who all work together to provide you with the care you need, when you need it.  We recommend signing up for the patient portal called "MyChart".  Sign up information is provided on this After Visit Summary.  MyChart is used to connect with patients for Virtual Visits (Telemedicine).  Patients are able to view lab/test results, encounter notes, upcoming appointments, etc.  Non-urgent messages can be sent to your provider as well.   To learn more about what you can do with MyChart, go to [email or web address].    Your next appointment:   3 month(s)  The format for your next appointment:   In Person  Provider:   Kate Sable, MD   Other Instructions

## 2019-12-21 ENCOUNTER — Other Ambulatory Visit: Payer: Self-pay | Admitting: Family Medicine

## 2019-12-21 DIAGNOSIS — J45909 Unspecified asthma, uncomplicated: Secondary | ICD-10-CM

## 2019-12-22 ENCOUNTER — Ambulatory Visit: Payer: Medicare Other | Attending: Internal Medicine

## 2019-12-22 ENCOUNTER — Telehealth: Payer: Self-pay | Admitting: Family Medicine

## 2019-12-22 DIAGNOSIS — Z23 Encounter for immunization: Secondary | ICD-10-CM

## 2019-12-22 NOTE — Progress Notes (Signed)
   Covid-19 Vaccination Clinic  Name:  MAELY CLEMENTS    MRN: [id number] DOB: 1960/09/17  12/22/2019  Ms. Levenhagen was observed post Covid-19 immunization for 15 minutes without incident. She was provided with Vaccine Information Sheet and instruction to access the V-Safe system.   Ms. Herber was instructed to call 911 with any severe reactions post vaccine: Marland Kitchen Difficulty breathing  . Swelling of face and throat  . A fast heartbeat  . A bad rash all over body  . Dizziness and weakness

## 2019-12-22 NOTE — Telephone Encounter (Signed)
Patient is calling because she had sleep study and is awaiting her C-PAP machine. Please advise CB- [phone]

## 2019-12-24 ENCOUNTER — Other Ambulatory Visit: Payer: Self-pay

## 2019-12-24 MED ORDER — LINACLOTIDE 290 MCG PO CAPS: 290.0000 ug | ORAL_CAPSULE | Freq: Every day | ORAL | 6 refills | Status: DC

## 2019-12-29 NOTE — Telephone Encounter (Signed)
Sleep study resulted on 716/2021, while I was on FMLA.  It does not seem that prescription has been sent for CPAP and supplies.  CPAP titration study was performed starting at 4 cm and ending on 10 cm.  Respiratory events increased in frequency during supine REM sleep.  During baseline PSG, prior to this study, she was found to have mild OSA with total AHI of 9/h, with events worse during REM sleep and supine sleep.  Events were eliminated at 9 cm of water, though there was no REM sleep at the final pressures.  Recommend CPAP therapy with pressure setting of 9 cm of water with heated humidity and ResMed F 30 fullface mask, small.  We can send prescription for this and all necessary supplies to [facility] for processing.  Please fax this today if possible.

## 2019-12-29 NOTE — Telephone Encounter (Signed)
Documents were faxed to [facility] in [place]. KW

## 2020-01-01 ENCOUNTER — Ambulatory Visit: Payer: Self-pay | Admitting: *Deleted

## 2020-01-01 NOTE — Chronic Care Management (AMB) (Signed)
Patient's CCM status changed to previously enrolled.    Elliot Gurney, [place] Worker  [facility] Care Management [phone]

## 2020-01-07 ENCOUNTER — Other Ambulatory Visit: Payer: Self-pay

## 2020-01-07 ENCOUNTER — Encounter: Payer: Self-pay | Admitting: Surgery

## 2020-01-07 ENCOUNTER — Telehealth: Payer: Self-pay

## 2020-01-07 ENCOUNTER — Ambulatory Visit: Payer: Medicare Other | Admitting: Surgery

## 2020-01-07 VITALS — BP 127/85 | HR 109 | Temp 98.8°F | Resp 14 | Ht 64.0 in | Wt 144.0 lb

## 2020-01-07 DIAGNOSIS — K449 Diaphragmatic hernia without obstruction or gangrene: Secondary | ICD-10-CM

## 2020-01-07 NOTE — Telephone Encounter (Signed)
Copied from [place] [id number]. Topic: General - Other >> Jan 07, 2020 11:54 AM Leward Quan A wrote: Reason for CRM: Dovie with [facility] called with patient to check status of a C-PAP machine that was to have been ordered 8 months ago asking for a call back from Dr Brita Romp or her nurse with some information. Patient is not sure what DME servise is to supply her with the C-PAP machine but states that she need it ASAP please .    Please contact patient at Ph# [phone]

## 2020-01-07 NOTE — Telephone Encounter (Signed)
I spoke with a representative from [facility], she states they did get her order 12/30/2019.  It is currently being processed now.  She states that there is a high demand of Cpap machines right now and not enough supply.  She did say she will give the pt a call to give her an idea on when she might receive her Cpap.  I called Ms. Domke to give her the update and also gave her [facility]'s Contact number incase she does not hear from them.  Thanks,   -[name]

## 2020-01-07 NOTE — Patient Instructions (Addendum)
CT scheduled 01/14/20  At 10:00 am  [facility]. Please pick up your prep kit at [facility] today.  Barium swallow scheduled [facility] at 10:30. Nothing to eat/drink 4 hours prior.   Please see your follow up appointment listed below.

## 2020-01-07 NOTE — Telephone Encounter (Signed)
[name] with [employer] company called in to follow up. Advised of message below per [name]. [name] expressed understanding and no concerns.

## 2020-01-08 ENCOUNTER — Other Ambulatory Visit: Payer: Self-pay

## 2020-01-08 ENCOUNTER — Ambulatory Visit: Payer: Medicare Other | Admitting: Gastroenterology

## 2020-01-08 DIAGNOSIS — K449 Diaphragmatic hernia without obstruction or gangrene: Secondary | ICD-10-CM

## 2020-01-09 NOTE — Progress Notes (Signed)
Surgical Consultation  01/09/2020  [name] is an 59 y.o. female.   Chief Complaint  Patient presents with  . New Patient (Initial Visit)    hiatal hernia   HPI: [name] is a 59 year old seen in consultation at the request of Dr.Wohl recalcitrant reflux associated to the hiatal hernia. He has had reflux symptoms for the last few years.  Reports significant heartburn worsening when laying down.  This has been recalcitrant and not responsive to PPI.  As part of the work-up she has had a pH probe showing and confirming significant reflux.  She also has no evidence of esophageal spasms.  He had a CT scan in 2019 that I have personally reviewed showing no evidence of significant paraesophageal hernias.  There was a normal esophagus and normal stomach. She does have some hoarseness as well.  She will also complains of intermittent sore throat. Did have an EGD 3 years ago and I have personally reviewed the images.  At that time she did have a esophageal stricture that was dilated. He is able to perform more than 4 METS of activity without any shortness of breath or chest pain.  She does have a diagnosis of A. fib and has a pending cardioversion, he is currently anticoagulated, she has had a prior history of hysterectomy as well as diagnostic laparoscopy. She goes to the pain clinic as well  Past Medical History:  Diagnosis Date  . A-fib ([place]) 2021  . Abnormal EKG    HX OF INVERTED T WAVES ON EKG, PALPITATIONS, CHEST PAINS-CARDIAC WORK UP DID NOT SHOW ANY HEART DISEASE  . Acute postoperative pain 01/04/2017  . 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 Anemia    Iron Infusion-8 yrs ago  . Anxiety   . Asthma    INHALERS WITH FLARE -UPS  . Cephalalgia 08/18/2014  . Cervical disc disease 08/18/2014   Needs neck surgery.   . Chronic headaches   . DDD (degenerative disc disease)    CERVICAL AND LUMBAR-CHRONIC PAIN, RT HIP LABRAL TEAR  . Depression    PT STATES A LOT OF STRESS IN HER LIFE  .  Dissociative disorder   . Dizziness 04/22/2013  . Duodenal ulcer with hemorrhage and perforation ([place]) 04/27/2003  . GERD (gastroesophageal reflux disease)   . H/O arthrodesis 08/18/2014  . Headache(784.0)    AND NECK PAIN--STATES RECENT TEST SHOW CERVICAL DEGENERATION  . History of blood transfusion    s/p back surgery  . History of cervical spinal surgery 01/04/2015  . History of kidney stones   . Hypertension   . Inverted T wave   . Narrowing of intervertebral disc space 08/18/2014   Currently on disability.   . Orthostatic hypotension 04/22/2013  . Pain    CHRONIC NECK AND BACK PAIN - LIMITED ROM NECK - S/P FUSIONS CERVICAL AND LUMBAR  . Pneumonia   . PONV (postoperative nausea and vomiting)    PT GIVES HX OF N&V AND FEVER WITH SURGERIES YEARS AGO--BUT NO PROBLEMS WITH MORE RECENT SURGERIES--STATES NOT MALIGNANT HYPERTHERMIA  . Postop Hyponatremia 05/14/2012  . Postoperative anemia due to acute blood loss 05/14/2012  . PTSD (post-traumatic stress disorder)   . Right foot drop   . 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   . Sleep apnea 2021    Past Surgical History:  Procedure Laterality Date  . ABDOMINAL HYSTERECTOMY    . afib  05/2019  . ANTERIOR CERVICAL DECOMP/DISCECTOMY FUSION N/A 10/30/2012  Procedure: ACDF C5-6, EXPLORATION AND HARDWARE REMOVAL C6-7;  Surgeon: Melina Schools, MD;  Location: [place] [facility];  Service: Orthopedics;  Laterality: N/A;  . ANTERIOR FUSION CERVICAL SPINE  MAY 2012   AT [facility]  . BACK SURGERY  2009   LUMBAR FUSION WITH RODS   . BREAST BIOPSY Right 2008   benign.- bx/clip  . BREAST EXCISIONAL BIOPSY Left 1998   benign  . BREAST REDUCTION SURGERY Bilateral 06/2016  . CARDIOVERSION N/A 12/03/2019   Procedure: CARDIOVERSION;  Surgeon: Kate Sable, MD;  Location: [facility];  Service: Cardiovascular;  Laterality: N/A;  . CARPAL TUNNEL RELEASE  05-06-12   Right  . CHOLECYSTECTOMY    . COLONOSCOPY WITH PROPOFOL N/A  01/26/2017   Procedure: COLONOSCOPY WITH PROPOFOL;  Surgeon: Lucilla Lame, MD;  Location: [place] [facility];  Service: Endoscopy;  Laterality: N/A;  . DIAGNOSTIC LAPAROSCOPIES - MULTIPLE FOR ENDOMETRIOSIS    . ESOPHAGEAL DILATION N/A 01/26/2017   Procedure: ESOPHAGEAL DILATION;  Surgeon: Lucilla Lame, MD;  Location: [place];  Service: Endoscopy;  Laterality: N/A;  . ESOPHAGEAL MANOMETRY N/A 05/30/2017   Procedure: ESOPHAGEAL MANOMETRY (EM);  Surgeon: Lucilla Lame, MD;  Location: [facility];  Service: Endoscopy;  Laterality: N/A;  . ESOPHAGOGASTRODUODENOSCOPY (EGD) WITH PROPOFOL N/A 01/26/2017   Procedure: ESOPHAGOGASTRODUODENOSCOPY (EGD) WITH PROPOFOL;  Surgeon: Lucilla Lame, MD;  Location: [place];  Service: Endoscopy;  Laterality: N/A;  . HIP ARTHROSCOPY  09/20/2011   Procedure: ARTHROSCOPY HIP;  Surgeon: Gearlean Alf, MD;  Location: [facility];  Service: Orthopedics;  Laterality: Right;  Right Hip Scope with Labral Debridement  . LEFT HEART CATH AND CORONARY ANGIOGRAPHY Left 06/10/2019   Procedure: LEFT HEART CATH AND CORONARY ANGIOGRAPHY;  Surgeon: Nelva Bush, MD;  Location: [place] [facility];  Service: Cardiovascular;  Laterality: Left;  . NASAL SEPTUM SURGERY  MARCH 2013   IN [place]  . [place] IMPEDANCE STUDY N/A 05/30/2017   Procedure: [place] IMPEDANCE STUDY;  Surgeon: Lucilla Lame, MD;  Location: [facility];  Service: Endoscopy;  Laterality: N/A;  . POLYPECTOMY N/A 01/26/2017   Procedure: POLYPECTOMY;  Surgeon: Lucilla Lame, MD;  Location: [place] [facility];  Service: Endoscopy;  Laterality: N/A;  . POSTERIOR CERVICAL FUSION/FORAMINOTOMY N/A 04/16/2013   Procedure: REMOVAL CERVICAL PLATES AND INTERBODY CAGE/POSTERIOR CERVICAL SPINAL FUSION C4 - C6/C5 CORPECTOMY/C4 - C6 FUSION WITH ILIAC CREST BONE GRAFT;  Surgeon: Melina Schools, MD;  Location: [place];  Service: Orthopedics;  Laterality: N/A;  . RADIOFREQUENCY ABLATION NERVES    . REDUCTION MAMMAPLASTY  Bilateral 05/2016  . RIGHT HIP ARTHROSCOPY FOR LABRAL TEAR  ABOUT 2010   2012 also  . SHOULDER ARTHROSCOPY  05-06-12   bone spur  . TONSILLECTOMY     AS A CHILD  . TOTAL HIP ARTHROPLASTY Right 05/13/2012   Procedure: TOTAL HIP ARTHROPLASTY ANTERIOR APPROACH;  Surgeon: Gearlean Alf, MD;  Location: [facility];  Service: Orthopedics;  Laterality: Right;    Family History  Problem Relation Age of Onset  . Aneurysm Mother   . Aneurysm Maternal Grandmother   . Breast cancer Paternal Grandmother 63  . Bipolar disorder Sister   . Bipolar disorder Grandchild   . Anxiety disorder Grandchild   . Depression Grandchild     Social History:  reports that she has never smoked. She has never used smokeless tobacco. She reports that she does not drink alcohol and does not use drugs.  Allergies:  Allergies  Allergen Reactions  . Amoxicillin Hives  . Chlorhexidine Gluconate Dermatitis  and Hives  . Clindamycin Hives  . Codeine Hives  . Erythromycin Hives    "mycins" in general  . Penicillin G Hives    "cillins" in general  . Sulfa Antibiotics Nausea And Vomiting and Hives       . Levofloxacin Hives  . Shellfish Allergy Hives  . Decadron [Dexamethasone] Other (See Comments)    Hot flashes, insomnia, "manic" Hot flashes, insomnia, "manic"  . Mangifera Indica Hives    papaya  . Papaya Derivatives Hives  . Betadine [Povidone Iodine] Hives       . Clarithromycin Hives  . Other Hives     Mango   . Povidone-Iodine Hives  . Prednisone Anxiety    High blood pressure, flushed, mood changes, heart palpitations High blood pressure, flushed, mood changes, heart palpitations    Medications reviewed.     ROS Full ROS performed and is otherwise negative other than what is stated in the HPI    BP 127/85   Pulse (!) 109   Temp 98.8 F (37.1 C) (Oral)   Resp 14   Ht 5\' 4"  (1.626 m)   Wt 144 lb (65.3 kg)   SpO2 97%   BMI 24.72 kg/m   Physical Exam Vitals and nursing note reviewed.  Exam conducted with a chaperone present.  Constitutional:      General: She is not in acute distress.    Appearance: Normal appearance. She is normal weight.  Eyes:     General: No scleral icterus.       Right eye: No discharge.        Left eye: No discharge.  Cardiovascular:     Rate and Rhythm: Normal rate and regular rhythm.  Pulmonary:     Effort: Pulmonary effort is normal. No respiratory distress.     Breath sounds: Normal breath sounds. No stridor. No wheezing.  Abdominal:     General: Abdomen is flat. There is no distension.     Palpations: Abdomen is soft. There is no mass.     Tenderness: There is no abdominal tenderness. There is no guarding or rebound.     Hernia: No hernia is present.  Musculoskeletal:        General: No swelling or tenderness. Normal range of motion.     Cervical back: Normal range of motion and neck supple. No rigidity or tenderness.  Lymphadenopathy:     Cervical: No cervical adenopathy.  Skin:    General: Skin is warm and dry.     Capillary Refill: Capillary refill takes less than 2 seconds.  Neurological:     General: No focal deficit present.     Mental Status: She is alert and oriented to person, place, and time.  Psychiatric:        Mood and Affect: Mood normal.        Behavior: Behavior normal.        Thought Content: Thought content normal.        Judgment: Judgment normal.        Assessment/Plan: Recalcitrant GERD causing significant daily symptoms and questionable hiatal hernia.  She had at some point in time esophageal stricture.  We will start work-up with an barium swallow as well as a CT scan of the chest and abdomen to evaluate the anatomy of the esophagus and the stomach.  She may be a candidate for antireflux surgery however I will be hesitant to perform a 360 degree wrap given the history of dysphagia.  She may benefit  from a toupee wrap and a hiatal hernia repair.  I will also asked Dr.Wohl repeat EGD to have an endoscopic  view of her esophagus and the stomach.  There is no need for urgent surgical intervention.  We will follow her up after she completes her work-up We Will require cardiology optimization before attempting any surgical intervention. A copy of this report was sent to the referring provider  Caroleen Hamman, MD [facility] General Surgeon

## 2020-01-12 ENCOUNTER — Telehealth: Payer: Self-pay

## 2020-01-12 ENCOUNTER — Other Ambulatory Visit: Payer: Self-pay

## 2020-01-12 ENCOUNTER — Ambulatory Visit
Admission: RE | Admit: 2020-01-12 | Discharge: 2020-01-12 | Disposition: A | Discharge status: Home / Self Care | Payer: Medicare Other | Source: Ambulatory Visit | Attending: Surgery | Admitting: Surgery

## 2020-01-12 DIAGNOSIS — K219 Gastro-esophageal reflux disease without esophagitis: Secondary | ICD-10-CM | POA: Diagnosis not present

## 2020-01-12 DIAGNOSIS — K449 Diaphragmatic hernia without obstruction or gangrene: Secondary | ICD-10-CM | POA: Diagnosis not present

## 2020-01-12 NOTE — Telephone Encounter (Signed)
Spoke with patient to notify her of recent Barium Swallow study results per Dr.Pabon. Patient verbalized understanding and was made aware of follow up appointment scheduled for Monday November 15 th at 10:45a.

## 2020-01-12 NOTE — Progress Notes (Signed)
Subjective:   [name] is a 59 y.o. female who presents for Medicare Annual (Subsequent) preventive examination.  Review of Systems    N/A  Cardiac Risk Factors include: hypertension     Objective:    Today's Vitals   01/13/20 0937 01/13/20 0938  BP:  132/84  Pulse:  64  Temp:  99 F (37.2 C)  TempSrc:  Oral  SpO2:  98%  Weight:  146 lb 12.8 oz (66.6 kg)  Height:  5\' 4"  (1.626 m)  PainSc: 2  2   PainLoc:  Abdomen   Body mass index is 25.2 kg/m.  Advanced Directives 01/13/2020 12/03/2019 11/26/2019 08/20/2019 07/10/2019 06/10/2019 01/14/2019  Does Patient Have a Medical Advance Directive? No No No No No No No  Type of Advance Directive - - - - - - -  Does patient want to make changes to medical advance directive? - - - - - - -  Copy of Scissors in Chart? - - - - - - -  Would patient like information on creating a medical advance directive? No - Patient declined No - Patient declined No - Patient declined No - Patient declined - No - Patient declined -  Pre-existing out of facility DNR order (yellow form or pink MOST form) - - - - - - -    Current Medications (verified) Outpatient Encounter Medications as of 01/13/2020  Medication Sig  . acetaminophen (TYLENOL) 500 MG tablet Take 500-1,000 mg by mouth every 6 (six) hours as needed for moderate pain.   Marland Kitchen albuterol (PROVENTIL HFA) 108 (90 Base) MCG/ACT inhaler Inhale 1-2 puffs into the lungs every 4 (four) hours as needed for wheezing or shortness of breath.   . ALPRAZolam (XANAX) 1 MG tablet Take 1 mg by mouth at bedtime.   Marland Kitchen amiodarone (PACERONE) 200 MG tablet Take 1 tablet (200 mg total) by mouth daily.  Marland Kitchen apixaban (ELIQUIS) 5 MG TABS tablet Take 1 tablet (5 mg total) by mouth 2 (two) times daily.  . calcium carbonate (TUMS - DOSED IN MG ELEMENTAL CALCIUM) 500 MG chewable tablet Chew 4 tablets by mouth daily as needed for heartburn.   . cyclobenzaprine (FLEXERIL) 5 MG tablet Take 1 tablet (5 mg  total) by mouth 3 (three) times daily as needed for muscle spasms.  . Fluticasone-Salmeterol (ADVAIR) 500-50 MCG/DOSE AEPB Inhale 1 puff into the lungs 2 (two) times daily as needed (cold symptoms).   Marland Kitchen linaclotide (LINZESS) 290 MCG CAPS capsule Take 1 capsule (290 mcg total) by mouth daily before breakfast.  . Menthol, Topical Analgesic, (BENGAY EX) Apply 1 application topically daily as needed (pain).  . montelukast (SINGULAIR) 10 MG tablet TAKE 1 TABLET BY MOUTH EVERY DAY  . morphine (MSIR) 15 MG tablet Take 1 tablet (15 mg total) by mouth every 6 (six) hours as needed for moderate pain or severe pain. Must last 30 days.  . ondansetron (ZOFRAN) 4 MG tablet Take 1 tablet (4 mg total) by mouth every 8 (eight) hours as needed for nausea or vomiting.  Marland Kitchen oxymetazoline (AFRIN) 0.05 % nasal spray Place 1 spray into both nostrils 2 (two) times daily as needed for congestion.  . pantoprazole (PROTONIX) 40 MG tablet TAKE 1 TABLET BY MOUTH TWICE A DAY (Patient taking differently: Take 40 mg by mouth 2 (two) times daily. )  . Spacer/Aero-Holding Chambers (AEROCHAMBER MV) inhaler by Does not apply route.  . valACYclovir (VALTREX) 1000 MG tablet TAKE TWO TABLETS BY MOUTH TWICE  DAILY FOR ONE DAY FEVER BLISTER (Patient taking differently: Take 2,000 mg by mouth 2 (two) times daily as needed (fever blisters). )  . morphine (MSIR) 15 MG tablet Take 1 tablet (15 mg total) by mouth every 6 (six) hours as needed for moderate pain or severe pain. Must last 30 days. (Patient not taking: Reported on 01/13/2020)  . [START ON 01/22/2020] morphine (MSIR) 15 MG tablet Take 1 tablet (15 mg total) by mouth every 6 (six) hours as needed for moderate pain or severe pain. Must last 30 days. (Patient not taking: Reported on 01/13/2020)   No facility-administered encounter medications on file as of 01/13/2020.    Allergies (verified) Amoxicillin, Chlorhexidine gluconate, Clindamycin, Codeine, Erythromycin, Penicillin g, Sulfa  antibiotics, Levofloxacin, Shellfish allergy, Decadron [dexamethasone], Mangifera indica, Papaya derivatives, Betadine [povidone iodine], Clarithromycin, Other, Povidone-iodine, and Prednisone   History: Past Medical History:  Diagnosis Date  . A-fib ([place]) 2021  . Abnormal EKG    HX OF INVERTED T WAVES ON EKG, PALPITATIONS, CHEST PAINS-CARDIAC WORK UP DID NOT SHOW ANY HEART DISEASE  . AC (acromioclavicular) joint bone spurs   . Acute postoperative pain 01/04/2017  . 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 Anemia    Iron Infusion-8 yrs ago  . Anxiety   . Asthma    INHALERS WITH FLARE -UPS  . Cephalalgia 08/18/2014  . Cervical disc disease 08/18/2014   Needs neck surgery.   . Chronic headaches   . DDD (degenerative disc disease)    CERVICAL AND LUMBAR-CHRONIC PAIN, RT HIP LABRAL TEAR  . Depression    PT STATES A LOT OF STRESS IN HER LIFE  . Dissociative disorder   . Dizziness 04/22/2013  . Duodenal ulcer with hemorrhage and perforation ([place]) 04/27/2003  . GERD (gastroesophageal reflux disease)   . H/O arthrodesis 08/18/2014  . Headache(784.0)    AND NECK PAIN--STATES RECENT TEST SHOW CERVICAL DEGENERATION  . History of blood transfusion    s/p back surgery  . History of cardioversion   . History of cervical spinal surgery 01/04/2015  . History of kidney stones   . Hypertension   . Inverted T wave   . Narrowing of intervertebral disc space 08/18/2014   Currently on disability.   . Orthostatic hypotension 04/22/2013  . Pain    CHRONIC NECK AND BACK PAIN - LIMITED ROM NECK - S/P FUSIONS CERVICAL AND LUMBAR  . Pneumonia   . PONV (postoperative nausea and vomiting)    PT GIVES HX OF N&V AND FEVER WITH SURGERIES YEARS AGO--BUT NO PROBLEMS WITH MORE RECENT SURGERIES--STATES NOT MALIGNANT HYPERTHERMIA  . Postop Hyponatremia 05/14/2012  . Postoperative anemia due to acute blood loss 05/14/2012  . PTSD (post-traumatic stress disorder)   . Right foot drop   . Right hip arthralgia 08/18/2014   Status post  surgery of right, and now needs left.   . Sleep apnea 2021   Past Surgical History:  Procedure Laterality Date  . ABDOMINAL HYSTERECTOMY    . afib  05/2019  . ANTERIOR CERVICAL DECOMP/DISCECTOMY FUSION N/A 10/30/2012   Procedure: ACDF C5-6, EXPLORATION AND HARDWARE REMOVAL C6-7;  Surgeon: Melina Schools, MD;  Location: [facility];  Service: Orthopedics;  Laterality: N/A;  . ANTERIOR FUSION CERVICAL SPINE  MAY 2012   AT [facility]  . BACK SURGERY  2009   LUMBAR FUSION WITH RODS   . BREAST BIOPSY Right 2008   benign.- bx/clip  . BREAST EXCISIONAL BIOPSY Left 1998   benign  . BREAST REDUCTION SURGERY Bilateral 06/2016  .  CARDIOVERSION N/A 12/03/2019   Procedure: CARDIOVERSION;  Surgeon: Kate Sable, MD;  Location: [facility];  Service: Cardiovascular;  Laterality: N/A;  . CARPAL TUNNEL RELEASE  05-06-12   Right  . CHOLECYSTECTOMY    . COLONOSCOPY WITH PROPOFOL N/A 01/26/2017   Procedure: COLONOSCOPY WITH PROPOFOL;  Surgeon: Lucilla Lame, MD;  Location: [facility];  Service: Endoscopy;  Laterality: N/A;  . DIAGNOSTIC LAPAROSCOPIES - MULTIPLE FOR ENDOMETRIOSIS    . ESOPHAGEAL DILATION N/A 01/26/2017   Procedure: ESOPHAGEAL DILATION;  Surgeon: Lucilla Lame, MD;  Location: [facility];  Service: Endoscopy;  Laterality: N/A;  . ESOPHAGEAL MANOMETRY N/A 05/30/2017   Procedure: ESOPHAGEAL MANOMETRY (EM);  Surgeon: Lucilla Lame, MD;  Location: [facility];  Service: Endoscopy;  Laterality: N/A;  . ESOPHAGOGASTRODUODENOSCOPY (EGD) WITH PROPOFOL N/A 01/26/2017   Procedure: ESOPHAGOGASTRODUODENOSCOPY (EGD) WITH PROPOFOL;  Surgeon: Lucilla Lame, MD;  Location: [facility];  Service: Endoscopy;  Laterality: N/A;  . HIP ARTHROSCOPY  09/20/2011   Procedure: ARTHROSCOPY HIP;  Surgeon: Gearlean Alf, MD;  Location: [facility];  Service: Orthopedics;  Laterality: Right;  Right Hip Scope with Labral Debridement  . LEFT HEART CATH AND CORONARY ANGIOGRAPHY Left 06/10/2019   Procedure: LEFT  HEART CATH AND CORONARY ANGIOGRAPHY;  Surgeon: Nelva Bush, MD;  Location: [facility];  Service: Cardiovascular;  Laterality: Left;  . NASAL SEPTUM SURGERY  MARCH 2013   IN [place]  . [place] IMPEDANCE STUDY N/A 05/30/2017   Procedure: [place] IMPEDANCE STUDY;  Surgeon: Lucilla Lame, MD;  Location: [facility];  Service: Endoscopy;  Laterality: N/A;  . POLYPECTOMY N/A 01/26/2017   Procedure: POLYPECTOMY;  Surgeon: Lucilla Lame, MD;  Location: [facility];  Service: Endoscopy;  Laterality: N/A;  . POSTERIOR CERVICAL FUSION/FORAMINOTOMY N/A 04/16/2013   Procedure: REMOVAL CERVICAL PLATES AND INTERBODY CAGE/POSTERIOR CERVICAL SPINAL FUSION C4 - C6/C5 CORPECTOMY/C4 - C6 FUSION WITH ILIAC CREST BONE GRAFT;  Surgeon: Melina Schools, MD;  Location: [facility];  Service: Orthopedics;  Laterality: N/A;  . RADIOFREQUENCY ABLATION NERVES    . REDUCTION MAMMAPLASTY Bilateral 05/2016  . RIGHT HIP ARTHROSCOPY FOR LABRAL TEAR  ABOUT 2010   2012 also  . SHOULDER ARTHROSCOPY  05-06-12   bone spur  . TONSILLECTOMY     AS A CHILD  . TOTAL HIP ARTHROPLASTY Right 05/13/2012   Procedure: TOTAL HIP ARTHROPLASTY ANTERIOR APPROACH;  Surgeon: Gearlean Alf, MD;  [facility];  Service: Orthopedics;  Laterality: Right;   Family History  Problem Relation Age of Onset  . Aneurysm Mother   . Aneurysm Maternal Grandmother   . Breast cancer Paternal Grandmother 3  . Bipolar disorder Sister   . Bipolar disorder Grandchild   . Anxiety disorder Grandchild   . Depression Grandchild    Social History   Socioeconomic History  . Marital status: Married    Spouse name: bruce  . Number of children: 2  . Years of education: Not on file  . Highest education level: Associate degree: occupational, Hotel manager, or vocational program  Occupational History    Comment: disabled  Tobacco Use  . Smoking status: Never Smoker  . Smokeless tobacco: Never Used  Vaping Use  . Vaping Use: Never used  Substance and  Sexual Activity  . Alcohol use: No    Alcohol/week: 0.0 standard drinks  . Drug use: No  . Sexual activity: Yes  Other Topics Concern  . Not on file  Social History Narrative   Son, daughter  And 4 grandkids; raises  3 of the grandchildren   Social Determinants of Health   Financial Resource Strain: High Risk  . Difficulty of Paying Living Expenses: Very hard  Food Insecurity: Food Insecurity Present  . Worried About Charity fundraiser in the Last Year: Sometimes true  . Ran Out of Food in the Last Year: Often true  Transportation Needs: Unmet Transportation Needs  . Lack of Transportation (Medical): Yes  . Lack of Transportation (Non-Medical): Yes  Physical Activity: Inactive  . Days of Exercise per Week: 0 days  . Minutes of Exercise per Session: 0 min  Stress: Stress Concern Present  . Feeling of Stress : Very much  Social Connections: Moderately Isolated  . Frequency of Communication with Friends and Family: More than three times a week  . Frequency of Social Gatherings with Friends and Family: Never  . Attends Religious Services: Never  . Active Member of Clubs or Organizations: No  . Attends Archivist Meetings: Never  . Marital Status: Married    Tobacco Counseling Counseling given: Not Answered   Clinical Intake:  Pre-visit preparation completed: Yes  Pain : 0-10 Pain Score: 2  Pain Type: Chronic pain Pain Location: Abdomen (Due to constipation.) Pain Orientation: Lower Pain Descriptors / Indicators: Aching Pain Frequency: Intermittent Pain Relieving Factors: BMs  Pain Relieving Factors: BMs  Nutritional Status: BMI of 19-24  Normal Nutritional Risks: Nausea/ vomitting/ diarrhea (Nausea and vomiting due to constipation issue.) Diabetes: No  How often do you need to have someone help you when you read instructions, pamphlets, or other written materials from your doctor or pharmacy?: 1 - Never  Diabetic? No  Interpreter Needed?:  No  Information entered by :: [facility], LPN   Activities of Daily Living In your present state of health, do you have any difficulty performing the following activities: 01/13/2020 12/03/2019  Hearing? N N  Vision? N N  Difficulty concentrating or making decisions? Y N  Walking or climbing stairs? Y Y  Comment Due to hip pains. -  Dressing or bathing? N N  Doing errands, shopping? N -  Preparing Food and eating ? N -  Using the Toilet? N -  In the past six months, have you accidently leaked urine? N -  Do you have problems with loss of bowel control? N -  Managing your Medications? N -  Managing your Finances? N -  Housekeeping or managing your Housekeeping? N -  Some recent data might be hidden    Patient Care Team: Brita Romp Dionne Bucy, MD as PCP - General (Family Medicine) Kate Sable, MD as PCP - Cardiology (Cardiology) Dorann Ou Montine Circle, NP as Nurse Practitioner (Psychiatry) Lucilla Lame, MD as Consulting Physician (Gastroenterology) Jules Husbands, MD as Consulting Physician (General Surgery) Milinda Pointer, MD as Referring Physician (Pain Medicine) Pcp, No Coralyn Helling, DMD (Dentistry)  Indicate any recent Medical Services you may have received from other than [facility] providers in the past year (date may be approximate).     Assessment:   This is a routine wellness examination for [place].  Hearing/Vision screen No exam data present  Dietary issues and exercise activities discussed: Current Exercise Habits: The patient does not participate in regular exercise at present, Exercise limited by: orthopedic condition(s);neurologic condition(s)  Goals      Patient Stated   .  "I am dealing with so much stress with my son and grandchildren" (pt-stated)      Current Barriers:  . Chronic Mental Health needs related to anxiety  and depression . Mental Health Concerns  . Family and relationship dysfunction . Lacks knowledge of community resource:  community resources to assist with financial difficulties . Suicidal Ideation/Homicidal Ideation: No  Clinical Social Work Goal(s):  Marland Kitchen Over the next  90 days, patient will work with SW bi-weekly by telephone or in person to reduce or manage symptoms of anxiety and depression related to her parenting responsibilities of her 3 grandchildren and dysfunctional relationship with her son   Interventions:  . Continued to explore relationship with her son explored, which remains conflictual . Explored progress of managing home schooling with her 3 grandchildren, with patient acknowledging no new improvements . Per patient, experience remains the same . Followed up on information provided  on [employer] [phone] www. [email or web address] and Grandparents Raising Grandchildren meets every 3rd Wednesday at 11 am at the [facility] [phone] . Patient confirmed that she has not contacted the resources provided . Patient encouraged to contact resources provided for added support . Patient encouraged to utilized resources provided and to call this Education officer, museum with any questions or concerns regarding her mental health or community resource needs.   Patient Self Care Activities:  . Performs ADL's independently . Performs IADL's independently . Ability for insight  Patient Coping Strengths:  . Family . Able to Communicate Effectively  Patient Self Care Deficits:  . Difficulties in establishing and maintaining boundaries with her adult son  Please see past updates related to this goal by clicking on the "Past Updates" button in the selected goal        Other   .  Increase water intake      Recommend increasing fluid intake to 4-6 glasses each day    .  LIFESTYLE - DECREASE FALLS RISK      Recommend to remove any items from the home that may cause slips or trips.      Depression Screen PHQ 2/9 Scores 01/13/2020 11/26/2019 08/20/2019 04/15/2019 12/31/2018 12/26/2018 12/05/2018  PHQ - 2 Score 2 2 0 0  6 6 0  PHQ- 9 Score 11 8 - 9 18 19  -  Exception Documentation - - - - - - -  Not completed - - - - - - -    Fall Risk Fall Risk  01/13/2020 01/07/2020 11/26/2019 11/19/2019 08/20/2019  Falls in the past year? 1 0 0 0 0  Comment - - - - -  Number falls in past yr: 1 - 0 - -  Comment - - - - -  Injury with Fall? 0 - 0 - -  Comment - - - - -  Risk Factor Category  - - - - -  Comment - - - - -  Risk for fall due to : Other (Comment) - - - -  Risk for fall due to: Comment Due to having drop foot. - - - -  Follow up - - Falls evaluation completed - -  Comment - - - - -    Any stairs in or around the home? Yes  If so, are there any without handrails? No  Home free of loose throw rugs in walkways, pet beds, electrical cords, etc? Yes  Adequate lighting in your home to reduce risk of falls? Yes   ASSISTIVE DEVICES UTILIZED TO PREVENT FALLS:  Life alert? No  Use of a cane, walker or w/c? Yes  Grab bars in the bathroom? No  Shower chair or bench in shower? No  Elevated toilet seat or a handicapped toilet?  No   TIMED UP AND GO:  Was the test performed? Yes .  Length of time to ambulate 10 feet: 12 sec.   Gait slow and steady without use of assistive device  Cognitive Function: Declined today.      6CIT Screen 12/07/2016  What Year? 0 points  What month? 0 points  What time? 0 points  Count back from 20 0 points  Months in reverse 0 points  Repeat phrase 0 points  Total Score 0    Immunizations Immunization History  Administered Date(s) Administered  . Influenza,inj,Quad PF,6+ Mos 12/30/2015, 12/07/2016, 12/25/2017, 12/27/2018, 01/13/2020  . PFIZER SARS-COV-2 Vaccination 06/06/2019, 07/04/2019, 12/22/2019  . Pneumococcal Polysaccharide-23 12/30/2015    TDAP status: Due, Education has been provided regarding the importance of this vaccine. Advised may receive this vaccine at local pharmacy or Health Dept. Aware to provide a copy of the vaccination record if obtained from  local pharmacy or Health Dept. Verbalized acceptance and understanding. Flu Vaccine status: Up to date Covid-19 vaccine status: Completed vaccines  Qualifies for Shingles Vaccine? Yes   Zostavax completed No   Shingrix Completed?: No.    Education has been provided regarding the importance of this vaccine. Patient has been advised to call insurance company to determine out of pocket expense if they have not yet received this vaccine. Advised may also receive vaccine at local pharmacy or Health Dept. Verbalized acceptance and understanding.  Screening Tests Health Maintenance  Topic Date Due  . TETANUS/TDAP  01/12/2021 (Originally 06/01/1979)  . MAMMOGRAM  02/10/2021  . COLONOSCOPY  01/26/2022  . INFLUENZA VACCINE  Completed  . COVID-19 Vaccine  Completed  . Hepatitis C Screening  Completed  . HIV Screening  Completed    Health Maintenance  There are no preventive care reminders to display for this patient.  Colorectal cancer screening: Completed 01/26/17. Repeat every 5 years Mammogram status: Completed 02/11/19. Repeat every year  Lung Cancer Screening: (Low Dose CT Chest recommended if Age 80-80 years, 30 pack-year currently smoking OR have quit w/in 15years.) does not qualify.    Additional Screening:  Hepatitis C Screening: Up to date  Vision Screening: Recommended annual ophthalmology exams for early detection of glaucoma and other disorders of the eye. Is the patient up to date with their annual eye exam?  Yes  Who is the provider or what is the name of the office in which the patient attends annual eye exams? [facility] If pt is not established with a provider, would they like to be referred to a provider to establish care? No .   Dental Screening: Recommended annual dental exams for proper oral hygiene  Community Resource Referral / Chronic Care Management: CRR required this visit?  No , declined a referral for financial assistance at this time. Referral was  placed in 2020 and pt declined assistance from C3 team then. Careguides contact information was left on the referral and stated to have pt CB if she changed her mind. Contact information for Ambrose Mantle given today.   CCM required this visit?  No      Plan:     I have personally reviewed and noted the following in the patient's chart:   . Medical and social history . Use of alcohol, tobacco or illicit drugs  . Current medications and supplements . Functional ability and status . Nutritional status . Physical activity . Advanced directives . List of other physicians . Hospitalizations, surgeries, and ER visits in previous 12 months .  Vitals . Screenings to include cognitive, depression, and falls . Referrals and appointments  In addition, I have reviewed and discussed with patient certain preventive protocols, quality metrics, and best practice recommendations. A written personalized care plan for preventive services as well as general preventive health recommendations were provided to patient.     Damaris Geers [place], [place]   14/04/3951   Nurse Notes: None

## 2020-01-12 NOTE — Telephone Encounter (Signed)
Patient notified of results.

## 2020-01-12 NOTE — Telephone Encounter (Signed)
-----   Message from Jules Husbands, MD sent at 01/12/2020  1:20 PM EDT ----- Please let the pt know study did show reflux, keep scheduled appt ----- Message ----- From: Interface, Rad Results In Sent: 01/12/2020  12:25 PM EDT To: Jules Husbands, MD

## 2020-01-13 ENCOUNTER — Ambulatory Visit (INDEPENDENT_AMBULATORY_CARE_PROVIDER_SITE_OTHER): Payer: Medicare Other

## 2020-01-13 ENCOUNTER — Ambulatory Visit (INDEPENDENT_AMBULATORY_CARE_PROVIDER_SITE_OTHER): Payer: Medicare Other | Admitting: Family Medicine

## 2020-01-13 ENCOUNTER — Telehealth: Payer: Self-pay | Admitting: Cardiology

## 2020-01-13 ENCOUNTER — Other Ambulatory Visit: Payer: Self-pay

## 2020-01-13 ENCOUNTER — Encounter: Payer: Self-pay | Admitting: Family Medicine

## 2020-01-13 ENCOUNTER — Encounter: Payer: Self-pay | Admitting: Gastroenterology

## 2020-01-13 VITALS — BP 132/84 | HR 64 | Temp 99.0°F | Ht 64.0 in | Wt 146.8 lb

## 2020-01-13 VITALS — BP 132/84 | HR 64 | Temp 99.0°F | Ht 64.0 in | Wt 146.0 lb

## 2020-01-13 DIAGNOSIS — I10 Essential (primary) hypertension: Secondary | ICD-10-CM

## 2020-01-13 DIAGNOSIS — Z1231 Encounter for screening mammogram for malignant neoplasm of breast: Secondary | ICD-10-CM | POA: Diagnosis not present

## 2020-01-13 DIAGNOSIS — E663 Overweight: Secondary | ICD-10-CM

## 2020-01-13 DIAGNOSIS — Z0000 Encounter for general adult medical examination without abnormal findings: Secondary | ICD-10-CM | POA: Diagnosis not present

## 2020-01-13 DIAGNOSIS — G4733 Obstructive sleep apnea (adult) (pediatric): Secondary | ICD-10-CM

## 2020-01-13 DIAGNOSIS — E7800 Pure hypercholesterolemia, unspecified: Secondary | ICD-10-CM

## 2020-01-13 DIAGNOSIS — R42 Dizziness and giddiness: Secondary | ICD-10-CM

## 2020-01-13 DIAGNOSIS — I483 Typical atrial flutter: Secondary | ICD-10-CM | POA: Diagnosis not present

## 2020-01-13 DIAGNOSIS — Z23 Encounter for immunization: Secondary | ICD-10-CM | POA: Diagnosis not present

## 2020-01-13 DIAGNOSIS — K449 Diaphragmatic hernia without obstruction or gangrene: Secondary | ICD-10-CM

## 2020-01-13 DIAGNOSIS — H9312 Tinnitus, left ear: Secondary | ICD-10-CM

## 2020-01-13 NOTE — Assessment & Plan Note (Signed)
Followed by cardiology Continue amiodarone Reviewed recent CBC and BMP

## 2020-01-13 NOTE — Telephone Encounter (Signed)
° °  [facility] Pre-operative Risk Assessment    HEARTCARE STAFF: - Please ensure there is not already an duplicate clearance open for this procedure. - Under Visit Info/Reason for Call, type in Other and utilize the format Clearance MM/DD/YY or Clearance TBD. Do not use dashes or single digits. - If request is for dental extraction, please clarify the # of teeth to be extracted.  Request for surgical clearance:  1. What type of surgery is being performed? EGD  2. When is this surgery scheduled? 01/16/20  3. What type of clearance is required (medical clearance vs. Pharmacy clearance to hold med vs. Both)? pharm  4. Are there any medications that need to be held prior to surgery and how long? Advise on eliquis   5. Practice name and name of physician performing surgery? [facility] Lucilla Lame MD  6. What is the office phone number? [phone]   7.   What is the office fax number? [phone]  8.   Anesthesia type (None, local, MAC, general) ? Not noted   Marykay Lex 01/13/2020, 2:21 PM  _________________________________________________________________   (provider comments below)

## 2020-01-13 NOTE — Assessment & Plan Note (Signed)
Discussed importance of healthy weight management Discussed diet and exercise  

## 2020-01-13 NOTE — Assessment & Plan Note (Signed)
CPAP on back order still - awaiting shipment

## 2020-01-13 NOTE — Telephone Encounter (Signed)
Clinical pharmacist to review ELiquis

## 2020-01-13 NOTE — Patient Instructions (Signed)

## 2020-01-13 NOTE — Assessment & Plan Note (Signed)
Reviewed last lipid panel Not currently on a statin Recheck FLP and CMP Discussed diet and exercise  

## 2020-01-13 NOTE — Telephone Encounter (Signed)
Patient with diagnosis of PAF/a flutter on Eliquis for anticoagulation.    Procedure: EGD Date of procedure: 01/16/20 (note, had cardioversion 12/03/19)  CHA2DS2-VASc Score = 2  This indicates a 2.2% annual risk of stroke. The patient's score is based upon: CHF History: 0 HTN History: 1 Diabetes History: 0 Stroke History: 0 Vascular Disease History: 0 Age Score: 0 Gender Score: 1       CrCl 85 mL/min  Per office protocol, patient can hold Eliquis for 1-2 days prior to procedure.    Patient will not need bridging with Lovenox (enoxaparin) around procedure.

## 2020-01-13 NOTE — Assessment & Plan Note (Signed)
Well controlled Also followed by cardiology Continue current medications Reviewed recent metabolic panel F/u in 12 months

## 2020-01-13 NOTE — Progress Notes (Signed)
Annual Wellness Visit     Patient: [name], Female    DOB: [birth year], 59 y.o.   MRN: [id number] Visit Date: 01/13/2020  Today's Provider: Lavon Paganini, MD   Chief Complaint  Patient presents with  . Annual Exam   Subjective    [name] is a 59 y.o. female who presents today for her Annual Wellness Visit. She reports consuming a general diet.  She generally feels well. She reports sleeping poorly. She does have additional problems to discuss today.   HPI   Patient Active Problem List   Diagnosis Date Noted  . Typical atrial flutter ([place])   . Foraminal stenosis of cervical region (C3-4) (Right) 08/26/2019  . 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) 08/26/2019  . DDD (degenerative disc disease), cervical 08/25/2019  . Cervical radiculitis (C4,C5,C8) (Left) 08/20/2019  . Pure hypercholesterolemia 06/27/2019  . Constipation due to opioid therapy 06/27/2019  . Snoring 06/27/2019  . Unstable angina ([place]) 06/10/2019  . Chronic musculoskeletal pain 01/14/2019  . Hypertension 12/27/2018  . Spondylosis without myelopathy or radiculopathy, lumbosacral region 11/12/2018  . History of allergy to radiographic contrast media 11/12/2018  . History of allergy to shellfish 11/12/2018  . DDD (degenerative disc disease), lumbosacral 07/31/2018  . Neuropathic pain 07/31/2018  . Neurogenic pain 07/31/2018  . Vitamin D deficiency 05/28/2018  . History of lumbar surgery 05/28/2018  . Groin pain, chronic, left 05/20/2018  . Other intervertebral disc degeneration, lumbar region 05/20/2018  . Disorder of skeletal system 05/20/2018  . Problems influencing health status 05/20/2018  . Overweight 12/25/2017  . Enthesopathy of hip region (Left) 07/13/2017  . Trigger point of shoulder region (Left) 02/26/2017  . Pharmacologic therapy   . Polyp of sigmoid colon   . Problems with swallowing and mastication   . Chronic shoulder arthropathy (Left)  01/04/2017  . Dysphagia 12/07/2016  . Abnormal flushing and sweating 12/07/2016  . Long term prescription benzodiazepine use 10/11/2016  . 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 Chronic hip pain (Secondary Area of Pain) (Bilateral) (L>R) 10/11/2016  . 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 Cervicogenic headache 07/06/2015  . History of total hip replacement (THR) (Right) 07/05/2015  . Chronic shoulder radicular pain (Bilateral) (L>R) 07/05/2015  . Lumbar facet syndrome (Bilateral) (L>R) 07/05/2015  . Chronic low back pain (Primary area of Pain) (Bilateral) (L>R) w/o sciatica 04/05/2015  . Chronic hip pain (Right) 01/04/2015  . Encounter for therapeutic drug level monitoring 01/04/2015  . Long term current use of opiate analgesic 01/04/2015  . Long term prescription opiate use 01/04/2015  . Opiate use 01/04/2015  . Chronic pain syndrome 01/04/2015  . Steroid intolerance 01/04/2015  . Cervical spondylosis 01/04/2015  . Cervical facet arthropathy (Bilateral) 01/04/2015  . History of cervical spinal surgery 01/04/2015  . Chronic shoulder pain [facility] Area of Pain)  (Bilateral) (L>R) 01/04/2015  . 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 Lumbar spondylosis 01/04/2015  . Cervical facet syndrome (Bilateral) 01/04/2015  . Chronic sacroiliac joint pain (Left) 01/04/2015  . DOE (dyspnea on exertion) 09/03/2014  . Asthma, mild 08/18/2014  . Blurred vision 08/18/2014  . Benign paroxysmal positional nystagmus 08/18/2014  . Clinical depression 08/18/2014  . Fatigue 08/18/2014  . Cannot sleep 08/18/2014  . Awareness of heartbeats 08/18/2014  . RAD (reactive airway disease) 08/18/2014  . Apnea, sleep 08/18/2014  . Chronic neck pain 04/16/2013  .  Anxiety state 08/03/2003  . Colon, diverticulosis 07/15/2003  . IBS (irritable bowel syndrome) 04/27/2003  . Esophagitis, reflux  02/11/2003  . 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 A-fib ([place]) 2021  . Abnormal EKG    HX OF INVERTED T WAVES ON EKG, PALPITATIONS, CHEST PAINS-CARDIAC WORK UP DID NOT SHOW ANY HEART DISEASE  . AC (acromioclavicular) joint bone spurs   . Acute postoperative pain 01/04/2017  . 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 Anemia    Iron Infusion-8 yrs ago  . Anxiety   . Asthma    INHALERS WITH FLARE -UPS  . Cephalalgia 08/18/2014  . Cervical disc disease 08/18/2014   Needs neck surgery.   . Chronic headaches   . DDD (degenerative disc disease)    CERVICAL AND LUMBAR-CHRONIC PAIN, RT HIP LABRAL TEAR  . Depression    PT STATES A LOT OF STRESS IN HER LIFE  . Dissociative disorder   . Dizziness 04/22/2013  . Duodenal ulcer with hemorrhage and perforation ([place]) 04/27/2003  . GERD (gastroesophageal reflux disease)   . H/O arthrodesis 08/18/2014  . Headache(784.0)    AND NECK PAIN--STATES RECENT TEST SHOW CERVICAL DEGENERATION  . History of blood transfusion    s/p back surgery  . History of cardioversion   . History of cervical spinal surgery 01/04/2015  . History of kidney stones   . Hypertension   . Inverted T wave   . Narrowing of intervertebral disc space 08/18/2014   Currently on disability.   . Orthostatic hypotension 04/22/2013  . Pain    CHRONIC NECK AND BACK PAIN - LIMITED ROM NECK - S/P FUSIONS CERVICAL AND LUMBAR  . Pneumonia   . PONV (postoperative nausea and vomiting)    PT GIVES HX OF N&V AND FEVER WITH SURGERIES YEARS AGO--BUT NO PROBLEMS WITH MORE RECENT SURGERIES--STATES NOT MALIGNANT HYPERTHERMIA  . Postop Hyponatremia 05/14/2012  . Postoperative anemia due to acute blood loss 05/14/2012  . PTSD (post-traumatic stress disorder)   . Right foot drop   . 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   . Sleep apnea 2021   Social History   Tobacco Use  . Smoking status: Never Smoker  .  Smokeless tobacco: Never Used  Vaping Use  . Vaping Use: Never used  Substance Use Topics  . Alcohol use: No    Alcohol/week: 0.0 standard drinks  . Drug use: No   Allergies  Allergen Reactions  . Amoxicillin Hives  . Chlorhexidine Gluconate Dermatitis and Hives  . Clindamycin Hives  . Codeine Hives  . Erythromycin Hives    "mycins" in general  . Penicillin G Hives    "cillins" in general  . Sulfa Antibiotics Nausea And Vomiting and Hives       . Levofloxacin Hives  . Shellfish Allergy Hives  . Decadron [Dexamethasone] Other (See Comments)    Hot flashes, insomnia, "manic" Hot flashes, insomnia, "manic"  . Mangifera Indica Hives    papaya  . Papaya Derivatives Hives  . Betadine [Povidone Iodine] Hives       . Clarithromycin Hives  . Other Hives     Mango   . Povidone-Iodine Hives  . Prednisone Anxiety    High blood pressure, flushed, mood changes, heart palpitations High blood pressure, flushed, mood changes, heart palpitations     Medications: Outpatient Medications Prior to Visit  Medication Sig  . acetaminophen (TYLENOL) 500 MG tablet  Take 500-1,000 mg by mouth every 6 (six) hours as needed for moderate pain.   Marland Kitchen albuterol (PROVENTIL HFA) 108 (90 Base) MCG/ACT inhaler Inhale 1-2 puffs into the lungs every 4 (four) hours as needed for wheezing or shortness of breath.   . ALPRAZolam (XANAX) 1 MG tablet Take 1 mg by mouth at bedtime.   Marland Kitchen amiodarone (PACERONE) 200 MG tablet Take 1 tablet (200 mg total) by mouth daily.  Marland Kitchen apixaban (ELIQUIS) 5 MG TABS tablet Take 1 tablet (5 mg total) by mouth 2 (two) times daily.  . calcium carbonate (TUMS - DOSED IN MG ELEMENTAL CALCIUM) 500 MG chewable tablet Chew 4 tablets by mouth daily as needed for heartburn.   . cyclobenzaprine (FLEXERIL) 5 MG tablet Take 1 tablet (5 mg total) by mouth 3 (three) times daily as needed for muscle spasms.  . Fluticasone-Salmeterol (ADVAIR) 500-50 MCG/DOSE AEPB Inhale 1 puff into the lungs 2 (two)  times daily as needed (cold symptoms).   Marland Kitchen linaclotide (LINZESS) 290 MCG CAPS capsule Take 1 capsule (290 mcg total) by mouth daily before breakfast.  . Menthol, Topical Analgesic, (BENGAY EX) Apply 1 application topically daily as needed (pain).  . montelukast (SINGULAIR) 10 MG tablet TAKE 1 TABLET BY MOUTH EVERY DAY  . morphine (MSIR) 15 MG tablet Take 1 tablet (15 mg total) by mouth every 6 (six) hours as needed for moderate pain or severe pain. Must last 30 days.  Marland Kitchen morphine (MSIR) 15 MG tablet Take 1 tablet (15 mg total) by mouth every 6 (six) hours as needed for moderate pain or severe pain. Must last 30 days.  Derrill Memo ON 01/22/2020] morphine (MSIR) 15 MG tablet Take 1 tablet (15 mg total) by mouth every 6 (six) hours as needed for moderate pain or severe pain. Must last 30 days.  . ondansetron (ZOFRAN) 4 MG tablet Take 1 tablet (4 mg total) by mouth every 8 (eight) hours as needed for nausea or vomiting.  Marland Kitchen oxymetazoline (AFRIN) 0.05 % nasal spray Place 1 spray into both nostrils 2 (two) times daily as needed for congestion.  . pantoprazole (PROTONIX) 40 MG tablet TAKE 1 TABLET BY MOUTH TWICE A DAY (Patient taking differently: Take 40 mg by mouth 2 (two) times daily. )  . Spacer/Aero-Holding Chambers (AEROCHAMBER MV) inhaler by Does not apply route.  . valACYclovir (VALTREX) 1000 MG tablet TAKE TWO TABLETS BY MOUTH TWICE DAILY FOR ONE DAY FEVER BLISTER (Patient taking differently: Take 2,000 mg by mouth 2 (two) times daily as needed (fever blisters). )   No facility-administered medications prior to visit.    Allergies  Allergen Reactions  . Amoxicillin Hives  . Chlorhexidine Gluconate Dermatitis and Hives  . Clindamycin Hives  . Codeine Hives  . Erythromycin Hives    "mycins" in general  . Penicillin G Hives    "cillins" in general  . Sulfa Antibiotics Nausea And Vomiting and Hives       . Levofloxacin Hives  . Shellfish Allergy Hives  . Decadron [Dexamethasone] Other (See  Comments)    Hot flashes, insomnia, "manic" Hot flashes, insomnia, "manic"  . Mangifera Indica Hives    papaya  . Papaya Derivatives Hives  . Betadine [Povidone Iodine] Hives       . Clarithromycin Hives  . Other Hives     Mango   . Povidone-Iodine Hives  . Prednisone Anxiety    High blood pressure, flushed, mood changes, heart palpitations High blood pressure, flushed, mood changes, heart palpitations  Patient Care Team: Virginia Crews, MD as PCP - General (Family Medicine) Kate Sable, MD as PCP - Cardiology (Cardiology) Dorann Ou Montine Circle, NP as Nurse Practitioner (Psychiatry) Lucilla Lame, MD as Consulting Physician (Gastroenterology) Jules Husbands, MD as Consulting Physician (General Surgery) Milinda Pointer, MD as Referring Physician (Pain Medicine) Pcp, No Coralyn Helling, DMD (Dentistry)  Review of Systems  Constitutional: Positive for appetite change. Negative for activity change, chills, diaphoresis, fatigue, fever and unexpected weight change.  HENT: Positive for dental problem and tinnitus. Negative for congestion, drooling, ear discharge, ear pain, facial swelling, hearing loss, mouth sores, nosebleeds, postnasal drip, rhinorrhea, sinus pressure, sinus pain, sneezing, sore throat, trouble swallowing and voice change.   Eyes: Negative.   Respiratory: Positive for apnea, chest tightness and shortness of breath. Negative for cough, choking, wheezing and stridor.   Cardiovascular: Positive for chest pain and palpitations. Negative for leg swelling.  Gastrointestinal: Positive for abdominal distention, abdominal pain, blood in stool, constipation, nausea and vomiting. Negative for anal bleeding, diarrhea and rectal pain.  Endocrine: Positive for heat intolerance. Negative for cold intolerance, polydipsia, polyphagia and polyuria.  Genitourinary: Negative.   Musculoskeletal: Positive for arthralgias, back pain, gait problem, joint swelling, myalgias,  neck pain and neck stiffness.  Skin: Negative.   Allergic/Immunologic: Positive for environmental allergies and food allergies. Negative for immunocompromised state.  Neurological: Positive for dizziness, weakness, light-headedness, numbness and headaches. Negative for tremors, seizures, syncope, facial asymmetry and speech difficulty.  Hematological: Negative.   Psychiatric/Behavioral: Positive for sleep disturbance. Negative for agitation, behavioral problems, confusion, decreased concentration, dysphoric mood, hallucinations, self-injury and suicidal ideas. The patient is nervous/anxious. The patient is not hyperactive.       Objective    Vitals: BP 132/84   Pulse 64   Temp 99 F (37.2 C) (Oral)   Ht 5\' 4"  (1.626 m)   Wt 146 lb (66.2 kg)   SpO2 98%   BMI 25.06 kg/m    Physical Exam Vitals reviewed.  Constitutional:      General: She is not in acute distress.    Appearance: Normal appearance. She is well-developed. She is not diaphoretic.  HENT:     Head: Normocephalic and atraumatic.     Right Ear: Tympanic membrane, ear canal and external ear normal.     Left Ear: Tympanic membrane, ear canal and external ear normal.  Eyes:     General: No scleral icterus.    Conjunctiva/sclera: Conjunctivae normal.     Pupils: Pupils are equal, round, and reactive to light.  Neck:     Thyroid: No thyromegaly.  Cardiovascular:     Rate and Rhythm: Normal rate and regular rhythm.     Pulses: Normal pulses.     Heart sounds: Normal heart sounds. No murmur heard.   Pulmonary:     Effort: Pulmonary effort is normal. No respiratory distress.     Breath sounds: Normal breath sounds. No wheezing or rales.  Abdominal:     General: There is no distension.     Palpations: Abdomen is soft.     Tenderness: There is no abdominal tenderness.  Musculoskeletal:        General: No deformity.     Cervical back: Neck supple.     Right lower leg: No edema.     Left lower leg: No edema.    Lymphadenopathy:     Cervical: No cervical adenopathy.  Skin:    General: Skin is warm and dry.     Findings:  No rash.  Neurological:     Mental Status: She is alert and oriented to person, place, and time. Mental status is at baseline.     Sensory: No sensory deficit.     Motor: No weakness.     Gait: Gait normal.  Psychiatric:        Mood and Affect: Mood normal.        Behavior: Behavior normal.        Thought Content: Thought content normal.      Most recent functional status assessment: In your present state of health, do you have any difficulty performing the following activities: 01/13/2020  Hearing? N  Vision? N  Difficulty concentrating or making decisions? Y  Walking or climbing stairs? Y  Comment Due to hip pains.  Dressing or bathing? N  Doing errands, shopping? N  Preparing Food and eating ? N  Using the Toilet? N  In the past six months, have you accidently leaked urine? N  Do you have problems with loss of bowel control? N  Managing your Medications? N  Managing your Finances? N  Housekeeping or managing your Housekeeping? N  Some recent data might be hidden   Most recent fall risk assessment: Fall Risk  01/13/2020  Falls in the past year? 1  Comment -  Number falls in past yr: 1  Comment -  Injury with Fall? 0  Comment -  Risk Factor Category  -  Comment -  Risk for fall due to : Other (Comment)  Risk for fall due to: Comment Due to having drop foot.  Follow up -  Comment -    Most recent depression screenings: PHQ 2/9 Scores 01/13/2020 11/26/2019  PHQ - 2 Score 2 2  PHQ- 9 Score 11 8  Exception Documentation - -  Not completed - -   Most recent cognitive screening: 6CIT Screen 12/07/2016  What Year? 0 points  What month? 0 points  What time? 0 points  Count back from 20 0 points  Months in reverse 0 points  Repeat phrase 0 points  Total Score 0   Most recent Audit-C alcohol use screening Alcohol Use Disorder Test (AUDIT) 11/26/2019   1. How often do you have a drink containing alcohol? 0  2. How many drinks containing alcohol do you have on a typical day when you are drinking? 0  3. How often do you have six or more drinks on one occasion? 0  AUDIT-C Score 0   A score of 3 or more in women, and 4 or more in men indicates increased risk for alcohol abuse, EXCEPT if all of the points are from question 1   No results found for any visits on 01/13/20.  Assessment & Plan     Annual wellness visit done today including the all of the following: Reviewed patient's Family Medical History Reviewed and updated list of patient's medical providers Assessment of cognitive impairment was done Assessed patient's functional ability Established a written schedule for health screening [place] Completed and Reviewed  Exercise Activities and Dietary recommendations Goals    .  "I am dealing with so much stress with my son and grandchildren" (pt-stated)      Current Barriers:  . Chronic Mental Health needs related to anxiety and depression . Mental Health Concerns  . Family and relationship dysfunction . Lacks knowledge of community resource: community resources to assist with financial difficulties . Suicidal Ideation/Homicidal Ideation: No  Clinical Social Work Goal(s):  .  Over the next  90 days, patient will work with SW bi-weekly by telephone or in person to reduce or manage symptoms of anxiety and depression related to her parenting responsibilities of her 3 grandchildren and dysfunctional relationship with her son   Interventions:  . Continued to explore relationship with her son explored, which remains conflictual . Explored progress of managing home schooling with her 3 grandchildren, with patient acknowledging no new improvements . Per patient, experience remains the same . Followed up on information provided  on [employer] [phone] www. [email or web address] and Grandparents Raising Grandchildren meets  every 3rd Wednesday at 11 am at the [facility] [phone] . Patient confirmed that she has not contacted the resources provided . Patient encouraged to contact resources provided for added support . Patient encouraged to utilized resources provided and to call this Education officer, museum with any questions or concerns regarding her mental health or community resource needs.   Patient Self Care Activities:  . Performs ADL's independently . Performs IADL's independently . Ability for insight  Patient Coping Strengths:  . Family . Able to Communicate Effectively  Patient Self Care Deficits:  . Difficulties in establishing and maintaining boundaries with her adult son  Please see past updates related to this goal by clicking on the "Past Updates" button in the selected goal      .  Increase water intake      Recommend increasing fluid intake to 4-6 glasses each day    .  LIFESTYLE - DECREASE FALLS RISK      Recommend to remove any items from the home that may cause slips or trips.       Immunization History  Administered Date(s) Administered  . Influenza,inj,Quad PF,6+ Mos 12/30/2015, 12/07/2016, 12/25/2017, 12/27/2018, 01/13/2020  . PFIZER SARS-COV-2 Vaccination 06/06/2019, 07/04/2019, 12/22/2019  . Pneumococcal Polysaccharide-23 12/30/2015    Health Maintenance  Topic Date Due  . TETANUS/TDAP  01/12/2021 (Originally [birth year]01/1979)  . MAMMOGRAM  02/10/2021  . COLONOSCOPY  01/26/2022  . INFLUENZA VACCINE  Completed  . COVID-19 Vaccine  Completed  . Hepatitis C Screening  Completed  . HIV Screening  Completed     Discussed health benefits of physical activity, and encouraged her to engage in regular exercise appropriate for her age and condition.    Problem List Items Addressed This Visit      Cardiovascular and Mediastinum   Hypertension    Well controlled Also followed by cardiology Continue current medications Reviewed recent metabolic panel F/u in 12 months        Relevant Orders   Hepatic function panel   Lipid panel   Typical atrial flutter (H[facility])    Followed by cardiology Continue amiodarone Reviewed recent CBC and BMP        Respiratory   Apnea, sleep    CPAP on back order still - awaiting shipment        Other   Overweight    Discussed importance of healthy weight management Discussed diet and exercise       Relevant Orders   Hepatic function panel   Lipid panel   Pure hypercholesterolemia    Reviewed last lipid panel Not currently on a statin Recheck FLP and CMP Discussed diet and exercise       Relevant Orders   Hepatic function panel   Lipid panel    Other Visit Diagnoses    Encounter for annual physical exam    -  Primary   Relevant Orders   Hepatic function panel  Lipid panel   Encounter for screening mammogram for malignant neoplasm of breast       Relevant Orders   MM 3D SCREEN BREAST BILATERAL   Vertigo       Relevant Orders   Ambulatory referral to ENT   Tinnitus of left ear       Relevant Orders   Ambulatory referral to ENT    - vertigo and tinnitus are new within the last week - normal exam today, but concerning for possible Menire's disease  - denies hearing loss, but has not had it checked - referral to ENT for further eval and management   Return in about 1 year (around 01/12/2021) for CPE, AWV, as scheduled.     I, Lavon Paganini, MD, have reviewed all documentation for this visit. The documentation on 01/13/20 for the exam, diagnosis, procedures, and orders are all accurate and complete.   Fontella Shan, Dionne Bucy, MD, MPH [facility]

## 2020-01-13 NOTE — Patient Instructions (Signed)
[name] , Thank you for taking time to come for your Medicare Wellness Visit. I appreciate your ongoing commitment to your health goals. Please review the following plan we discussed and let me know if I can assist you in the future.   Screening recommendations/referrals: Colonoscopy: Up to date, due 01/2022 Mammogram: Up to date, due 02/2020 Recommended yearly ophthalmology/optometry visit for glaucoma screening and checkup Recommended yearly dental visit for hygiene and checkup  Vaccinations: Influenza vaccine: Administered today.  Tdap vaccine: Currently due. Declined at this time.  Shingles vaccine: Shingrix discussed. Please contact your pharmacy for coverage information.     Advanced directives: Advance directive discussed with you today. Even though you declined this today please call our office should you change your mind and we can give you the proper paperwork for you to fill out.  Conditions/risks identified: Fall risk preventatives discussed today.   Next appointment: 10:20 AM today with Dr Brita Romp   Preventive Care 40-64 Years, Female Preventive care refers to lifestyle choices and visits with your health care provider that can promote health and wellness. What does preventive care include?  A yearly physical exam. This is also called an annual well check.  Dental exams once or twice a year.  Routine eye exams. Ask your health care provider how often you should have your eyes checked.  Personal lifestyle choices, including:  Daily care of your teeth and gums.  Regular physical activity.  Eating a healthy diet.  Avoiding tobacco and drug use.  Limiting alcohol use.  Practicing safe sex.  Taking low-dose aspirin daily starting at age 31.  Taking vitamin and mineral supplements as recommended by your health care provider. What happens during an annual well check? The services and screenings done by your health care provider during your annual well check  will depend on your age, overall health, lifestyle risk factors, and family history of disease. Counseling  Your health care provider may ask you questions about your:  Alcohol use.  Tobacco use.  Drug use.  Emotional well-being.  Home and relationship well-being.  Sexual activity.  Eating habits.  Work and work Statistician.  Method of birth control.  Menstrual cycle.  Pregnancy history. Screening  You may have the following tests or measurements:  Height, weight, and BMI.  Blood pressure.  Lipid and cholesterol levels. These may be checked every 5 years, or more frequently if you are over 36 years old.  Skin check.  Lung cancer screening. You may have this screening every year starting at age 16 if you have a 30-pack-year history of smoking and currently smoke or have quit within the past 15 years.  Fecal occult blood test (FOBT) of the stool. You may have this test every year starting at age 54.  Flexible sigmoidoscopy or colonoscopy. You may have a sigmoidoscopy every 5 years or a colonoscopy every 10 years starting at age 61.  Hepatitis C blood test.  Hepatitis B blood test.  Sexually transmitted disease (STD) testing.  Diabetes screening. This is done by checking your blood sugar (glucose) after you have not eaten for a while (fasting). You may have this done every 1-3 years.  Mammogram. This may be done every 1-2 years. Talk to your health care provider about when you should start having regular mammograms. This may depend on whether you have a family history of breast cancer.  BRCA-related cancer screening. This may be done if you have a family history of breast, ovarian, tubal, or peritoneal cancers.  Pelvic exam and Pap test. This may be done every 3 years starting at age 65. Starting at age 54, this may be done every 5 years if you have a Pap test in combination with an HPV test.  Bone density scan. This is done to screen for osteoporosis. You may  have this scan if you are at high risk for osteoporosis. Discuss your test results, treatment options, and if necessary, the need for more tests with your health care provider. Vaccines  Your health care provider may recommend certain vaccines, such as:  Influenza vaccine. This is recommended every year.  Tetanus, diphtheria, and acellular pertussis (Tdap, Td) vaccine. You may need a Td booster every 10 years.  Zoster vaccine. You may need this after age 11.  Pneumococcal 13-valent conjugate (PCV13) vaccine. You may need this if you have certain conditions and were not previously vaccinated.  Pneumococcal polysaccharide (PPSV23) vaccine. You may need one or two doses if you smoke cigarettes or if you have certain conditions. Talk to your health care provider about which screenings and vaccines you need and how often you need them. This information is not intended to replace advice given to you by your health care provider. Make sure you discuss any questions you have with your health care provider. Document Released: 03/26/2015 Document Revised: 11/17/2015 Document Reviewed: 12/29/2014 Elsevier Interactive Patient Education  2017 [place] Prevention in the Home Falls can cause injuries. They can happen to people of all ages. There are many things you can do to make your home safe and to help prevent falls. What can I do on the outside of my home?  Regularly fix the edges of walkways and driveways and fix any cracks.  Remove anything that might make you trip as you walk through a door, such as a raised step or threshold.  Trim any bushes or trees on the path to your home.  Use bright outdoor lighting.  Clear any walking paths of anything that might make someone trip, such as rocks or tools.  Regularly check to see if handrails are loose or broken. Make sure that both sides of any steps have handrails.  Any raised decks and porches should have guardrails on the  edges.  Have any leaves, snow, or ice cleared regularly.  Use sand or salt on walking paths during winter.  Clean up any spills in your garage right away. This includes oil or grease spills. What can I do in the bathroom?  Use night lights.  Install grab bars by the toilet and in the tub and shower. Do not use towel bars as grab bars.  Use non-skid mats or decals in the tub or shower.  If you need to sit down in the shower, use a plastic, non-slip stool.  Keep the floor dry. Clean up any water that spills on the floor as soon as it happens.  Remove soap buildup in the tub or shower regularly.  Attach bath mats securely with double-sided non-slip rug tape.  Do not have throw rugs and other things on the floor that can make you trip. What can I do in the bedroom?  Use night lights.  Make sure that you have a light by your bed that is easy to reach.  Do not use any sheets or blankets that are too big for your bed. They should not hang down onto the floor.  Have a firm chair that has side arms. You can use this for  support while you get dressed.  Do not have throw rugs and other things on the floor that can make you trip. What can I do in the kitchen?  Clean up any spills right away.  Avoid walking on wet floors.  Keep items that you use a lot in easy-to-reach places.  If you need to reach something above you, use a strong step stool that has a grab bar.  Keep electrical cords out of the way.  Do not use floor polish or wax that makes floors slippery. If you must use wax, use non-skid floor wax.  Do not have throw rugs and other things on the floor that can make you trip. What can I do with my stairs?  Do not leave any items on the stairs.  Make sure that there are handrails on both sides of the stairs and use them. Fix handrails that are broken or loose. Make sure that handrails are as long as the stairways.  Check any carpeting to make sure that it is firmly  attached to the stairs. Fix any carpet that is loose or worn.  Avoid having throw rugs at the top or bottom of the stairs. If you do have throw rugs, attach them to the floor with carpet tape.  Make sure that you have a light switch at the top of the stairs and the bottom of the stairs. If you do not have them, ask someone to add them for you. What else can I do to help prevent falls?  Wear shoes that:  Do not have high heels.  Have rubber bottoms.  Are comfortable and fit you well.  Are closed at the toe. Do not wear sandals.  If you use a stepladder:  Make sure that it is fully opened. Do not climb a closed stepladder.  Make sure that both sides of the stepladder are locked into place.  Ask someone to hold it for you, if possible.  Clearly mark and make sure that you can see:  Any grab bars or handrails.  First and last steps.  Where the edge of each step is.  Use tools that help you move around (mobility aids) if they are needed. These include:  Canes.  Walkers.  Scooters.  Crutches.  Turn on the lights when you go into a dark area. Replace any light bulbs as soon as they burn out.  Set up your furniture so you have a clear path. Avoid moving your furniture around.  If any of your floors are uneven, fix them.  If there are any pets around you, be aware of where they are.  Review your medicines with your doctor. Some medicines can make you feel dizzy. This can increase your chance of falling. Ask your doctor what other things that you can do to help prevent falls. This information is not intended to replace advice given to you by your health care provider. Make sure you discuss any questions you have with your health care provider. Document Released: 12/24/2008 Document Revised: 08/05/2015 Document Reviewed: 04/03/2014 Elsevier Interactive Patient Education  2017 [employer].

## 2020-01-14 ENCOUNTER — Other Ambulatory Visit
Admission: RE | Admit: 2020-01-14 | Discharge: 2020-01-14 | Disposition: A | Discharge status: Home / Self Care | Payer: Medicare Other | Source: Home / Self Care | Attending: Surgery | Admitting: Surgery

## 2020-01-14 ENCOUNTER — Ambulatory Visit
Admission: RE | Admit: 2020-01-14 | Discharge: 2020-01-14 | Disposition: A | Discharge status: Home / Self Care | Payer: Medicare Other | Source: Ambulatory Visit | Attending: Surgery | Admitting: Surgery

## 2020-01-14 ENCOUNTER — Telehealth: Payer: Self-pay

## 2020-01-14 ENCOUNTER — Other Ambulatory Visit
Admission: RE | Admit: 2020-01-14 | Discharge: 2020-01-14 | Disposition: A | Discharge status: Home / Self Care | Payer: Medicare Other | Source: Ambulatory Visit | Attending: Gastroenterology | Admitting: Gastroenterology

## 2020-01-14 ENCOUNTER — Other Ambulatory Visit: Payer: Self-pay | Admitting: Surgery

## 2020-01-14 ENCOUNTER — Other Ambulatory Visit (HOSPITAL_COMMUNITY): Payer: Self-pay | Admitting: Surgery

## 2020-01-14 ENCOUNTER — Other Ambulatory Visit: Payer: Self-pay

## 2020-01-14 DIAGNOSIS — K449 Diaphragmatic hernia without obstruction or gangrene: Secondary | ICD-10-CM

## 2020-01-14 DIAGNOSIS — U071 COVID-19: Secondary | ICD-10-CM | POA: Insufficient documentation

## 2020-01-14 DIAGNOSIS — R109 Unspecified abdominal pain: Secondary | ICD-10-CM | POA: Diagnosis not present

## 2020-01-14 DIAGNOSIS — Z981 Arthrodesis status: Secondary | ICD-10-CM | POA: Diagnosis not present

## 2020-01-14 DIAGNOSIS — Z01812 Encounter for preprocedural laboratory examination: Secondary | ICD-10-CM | POA: Insufficient documentation

## 2020-01-14 LAB — SARS CORONAVIRUS 2 (TAT 6-24 HRS): SARS Coronavirus 2: POSITIVE — AB

## 2020-01-14 LAB — HEPATIC FUNCTION PANEL
ALT: 8 IU/L (ref 0–32)
AST: 15 IU/L (ref 0–40)
Albumin: 4.2 g/dL (ref 3.8–4.9)
Alkaline Phosphatase: 111 IU/L (ref 44–121)
Bilirubin Total: 0.6 mg/dL (ref 0.0–1.2)
Bilirubin, Direct: 0.18 mg/dL (ref 0.00–0.40)
Total Protein: 6.7 g/dL (ref 6.0–8.5)

## 2020-01-14 LAB — LIPID PANEL
Chol/HDL Ratio: 2.9 ratio (ref 0.0–4.4)
Cholesterol, Total: 183 mg/dL (ref 100–199)
HDL: 64 mg/dL (ref >39)
LDL Chol Calc (NIH): 99 mg/dL (ref 0–99)
Triglycerides: 116 mg/dL (ref 0–149)
VLDL Cholesterol Cal: 20 mg/dL (ref 5–40)

## 2020-01-14 LAB — CREATININE, SERUM
Creatinine, Ser: 0.77 mg/dL (ref 0.44–1.00)
GFR, Estimated: >60 mL/min (ref >60)

## 2020-01-14 MED ORDER — IOHEXOL 300 MG/ML  SOLN
100.0000 mL | Freq: Once | INTRAMUSCULAR | Status: AC | PRN
  Administered 2020-01-14: 100 mL via INTRAVENOUS

## 2020-01-14 NOTE — Telephone Encounter (Signed)
   Primary Cardiologist: Kate Sable, MD  Chart reviewed as part of pre-operative protocol coverage. Patient was contacted 01/14/2020 in reference to pre-operative risk assessment for pending surgery as outlined below.  FAYETTE GASNER was last seen on 12/19/2019 by Dr. Garen Lah.  Since that day, [name] has done well.  Therefore, based on ACC/AHA guidelines, the patient would be at acceptable risk for the planned procedure without further cardiovascular testing.   The patient was advised that if she develops new symptoms prior to surgery to contact our office to arrange for a follow-up visit, and she verbalized understanding.  I will route this recommendation to the requesting party via Epic fax function and remove from pre-op pool. Please call with questions.  Her last dose of Eliquis was on the night of 11/2, she may hold her Eliquis until after the procedure. We recommend restart Eliquis as soon as possible at the surgeon's discretion based on bleeding risk.   [place], [place] 01/14/2020, 2:35 PM

## 2020-01-14 NOTE — Telephone Encounter (Signed)
Patient has been advised per Cardiology chart note the following "Per office protocol, patient can hold Eliquis for 1-2 days prior to procedure".  Thanks,  [place] , [place]

## 2020-01-14 NOTE — Telephone Encounter (Signed)
Patient notified of CT scheduled 02/04/20 @ 8:45 am [facility] Clear liquids only 4 hours prior. Patient instructed to pick up prep kit a few days prior to scan.  Dr.Pabon scheduled appointment 02/11/20 @ 9:15 am.

## 2020-01-15 ENCOUNTER — Telehealth: Payer: Self-pay | Admitting: Adult Health

## 2020-01-15 ENCOUNTER — Telehealth: Payer: Self-pay | Admitting: Nurse Practitioner

## 2020-01-15 ENCOUNTER — Telehealth: Payer: Self-pay

## 2020-01-15 NOTE — Telephone Encounter (Signed)
Called to Discuss with patient about Covid symptoms and the use of the monoclonal antibody infusion for those with mild to moderate Covid symptoms and at a high risk of hospitalization.     Pt appears to qualify for this infusion due to co-morbid conditions and/or a member of an at-risk group in accordance with the FDA Emergency Use Authorization. (BMI >25, hypertension)   Unable to reach pt. Voicemail left and My Chart message sent.   Alda Lea, NP [facility]  [phone]

## 2020-01-15 NOTE — Telephone Encounter (Signed)
Called patient to discuss monoclonal antibody therapy for covid 19.  She notes she received the COVID 19 vaccine booster.  She was sick the day after, and has since recovered.  She does not meet criteria for treatment.  I wished her well.  Wilber Bihari, NP

## 2020-01-15 NOTE — Telephone Encounter (Signed)
Pt. Given results, verbalizes understanding. States she was tested for endoscopy and it has been rescheduled for later this month. Was COVID 19 positive 1 month ago and is better now.

## 2020-01-15 NOTE — Telephone Encounter (Signed)
LMTCB 01/15/2020  PEC Please advise pt of results below.    Thanks,   -[name]

## 2020-01-15 NOTE — Telephone Encounter (Signed)
Ok. Positive test can linger for up to 90 days.  If she has recovered from positive one month ago, does not need to quarantine now.

## 2020-01-15 NOTE — Telephone Encounter (Signed)
-----   Message from Virginia Crews, MD sent at 01/15/2020  8:41 AM EDT ----- Your COVID test is positive. You should remain isolated and quarantine for at least 10 days from start of symptoms, or today if you are asymptomatic. You must be feeling better and be fever free without any fever reducers for at least 48 hours as well. Your household contacts should be tested as well as work contacts. If you feel worse or have increasing shortness of breath, you should be seen in person at urgent care or the emergency room.

## 2020-01-15 NOTE — Telephone Encounter (Signed)
Patient advised as below.  

## 2020-01-15 NOTE — Telephone Encounter (Signed)
Pt. Called back. States she tried to call back about infusion therapy, but could not get through. States she had COVID 19 last month and is better.She was tested this last time for a procedure.

## 2020-01-26 ENCOUNTER — Ambulatory Visit: Payer: Self-pay | Admitting: Surgery

## 2020-01-28 ENCOUNTER — Other Ambulatory Visit: Payer: Medicare Other | Attending: Gastroenterology

## 2020-01-29 NOTE — Discharge Instructions (Signed)
General Anesthesia, Adult, Care After This sheet gives you information about how to care for yourself after your procedure. Your health care provider may also give you more specific instructions. If you have problems or questions, contact your health care provider. What can I expect after the procedure? After the procedure, the following side effects are common:  Pain or discomfort at the IV site.  Nausea.  Vomiting.  Sore throat.  Trouble concentrating.  Feeling cold or chills.  Weak or tired.  Sleepiness and fatigue.  Soreness and body aches. These side effects can affect parts of the body that were not involved in surgery. Follow these instructions at home:  For at least 24 hours after the procedure:  Have a responsible adult stay with you. It is important to have someone help care for you until you are awake and alert.  Rest as needed.  Do not: ? Participate in activities in which you could fall or become injured. ? Drive. ? Use heavy machinery. ? Drink alcohol. ? Take sleeping pills or medicines that cause drowsiness. ? Make important decisions or sign legal documents. ? Take care of children on your own. Eating and drinking  Follow any instructions from your health care provider about eating or drinking restrictions.  When you feel hungry, start by eating small amounts of foods that are soft and easy to digest (bland), such as toast. Gradually return to your regular diet.  Drink enough fluid to keep your urine pale yellow.  If you vomit, rehydrate by drinking water, juice, or clear broth. General instructions  If you have sleep apnea, surgery and certain medicines can increase your risk for breathing problems. Follow instructions from your health care provider about wearing your sleep device: ? Anytime you are sleeping, including during daytime naps. ? While taking prescription pain medicines, sleeping medicines, or medicines that make you drowsy.  Return to  your normal activities as told by your health care provider. Ask your health care provider what activities are safe for you.  Take over-the-counter and prescription medicines only as told by your health care provider.  If you smoke, do not smoke without supervision.  Keep all follow-up visits as told by your health care provider. This is important. Contact a health care provider if:  You have nausea or vomiting that does not get better with medicine.  You cannot eat or drink without vomiting.  You have pain that does not get better with medicine.  You are unable to pass urine.  You develop a skin rash.  You have a fever.  You have redness around your IV site that gets worse. Get help right away if:  You have difficulty breathing.  You have chest pain.  You have blood in your urine or stool, or you vomit blood. Summary  After the procedure, it is common to have a sore throat or nausea. It is also common to feel tired.  Have a responsible adult stay with you for the first 24 hours after general anesthesia. It is important to have someone help care for you until you are awake and alert.  When you feel hungry, start by eating small amounts of foods that are soft and easy to digest (bland), such as toast. Gradually return to your regular diet.  Drink enough fluid to keep your urine pale yellow.  Return to your normal activities as told by your health care provider. Ask your health care provider what activities are safe for you. This information is not   intended to replace advice given to you by your health care provider. Make sure you discuss any questions you have with your health care provider. Document Revised: 03/02/2017 Document Reviewed: 10/13/2016 Elsevier Patient Education  2020 Elsevier Inc.  

## 2020-01-30 ENCOUNTER — Ambulatory Visit
Admission: RE | Disposition: A | Discharge status: Home / Self Care | Payer: Self-pay | Source: Home / Self Care | Attending: Gastroenterology

## 2020-01-30 ENCOUNTER — Ambulatory Visit
Admission: RE | Admit: 2020-01-30 | Discharge: 2020-01-30 | Disposition: A | Discharge status: Home / Self Care | Payer: Medicare Other | Attending: Gastroenterology | Admitting: Gastroenterology

## 2020-01-30 ENCOUNTER — Encounter: Payer: Self-pay | Admitting: Gastroenterology

## 2020-01-30 ENCOUNTER — Other Ambulatory Visit: Payer: Self-pay

## 2020-01-30 ENCOUNTER — Ambulatory Visit: Payer: Medicare Other | Admitting: Anesthesiology

## 2020-01-30 DIAGNOSIS — Z7951 Long term (current) use of inhaled steroids: Secondary | ICD-10-CM | POA: Diagnosis not present

## 2020-01-30 DIAGNOSIS — Z96641 Presence of right artificial hip joint: Secondary | ICD-10-CM | POA: Insufficient documentation

## 2020-01-30 DIAGNOSIS — Z7901 Long term (current) use of anticoagulants: Secondary | ICD-10-CM | POA: Insufficient documentation

## 2020-01-30 DIAGNOSIS — K449 Diaphragmatic hernia without obstruction or gangrene: Secondary | ICD-10-CM | POA: Insufficient documentation

## 2020-01-30 DIAGNOSIS — R12 Heartburn: Secondary | ICD-10-CM | POA: Diagnosis not present

## 2020-01-30 DIAGNOSIS — K2970 Gastritis, unspecified, without bleeding: Secondary | ICD-10-CM

## 2020-01-30 DIAGNOSIS — K3189 Other diseases of stomach and duodenum: Secondary | ICD-10-CM | POA: Insufficient documentation

## 2020-01-30 DIAGNOSIS — Z79899 Other long term (current) drug therapy: Secondary | ICD-10-CM | POA: Diagnosis not present

## 2020-01-30 DIAGNOSIS — K219 Gastro-esophageal reflux disease without esophagitis: Secondary | ICD-10-CM | POA: Insufficient documentation

## 2020-01-30 HISTORY — PX: ESOPHAGOGASTRODUODENOSCOPY (EGD) WITH PROPOFOL: SHX5813

## 2020-01-30 SURGERY — ESOPHAGOGASTRODUODENOSCOPY (EGD) WITH PROPOFOL
Anesthesia: General | Site: Mouth

## 2020-01-30 MED ORDER — LACTATED RINGERS IV SOLN: INTRAVENOUS | Status: DC

## 2020-01-30 MED ORDER — STERILE WATER FOR IRRIGATION IR SOLN: Status: DC | PRN

## 2020-01-30 MED ORDER — GLYCOPYRROLATE 0.2 MG/ML IJ SOLN
INTRAMUSCULAR | Status: DC | PRN
  Administered 2020-01-30: .1 mg via INTRAVENOUS

## 2020-01-30 MED ORDER — PROPOFOL 10 MG/ML IV BOLUS
INTRAVENOUS | Status: DC | PRN
  Administered 2020-01-30: 20 mg via INTRAVENOUS
  Administered 2020-01-30: 150 mg via INTRAVENOUS
  Administered 2020-01-30: 40 mg via INTRAVENOUS

## 2020-01-30 MED ORDER — LIDOCAINE HCL (CARDIAC) PF 100 MG/5ML IV SOSY
PREFILLED_SYRINGE | INTRAVENOUS | Status: DC | PRN
  Administered 2020-01-30: 30 mg via INTRAVENOUS

## 2020-01-30 SURGICAL SUPPLY — 8 items
BLOCK BITE 60FR ADLT L/F GRN (MISCELLANEOUS) ×3 IMPLANT
FORCEPS BIOP RAD 4 LRG CAP 4 (CUTTING FORCEPS) ×2 IMPLANT
GOWN CVR UNV OPN BCK APRN NK (MISCELLANEOUS) ×2 IMPLANT
GOWN ISOL THUMB LOOP REG UNIV (MISCELLANEOUS) ×6
KIT PRC NS LF DISP ENDO (KITS) ×1 IMPLANT
KIT PROCEDURE OLYMPUS (KITS) ×3
MANIFOLD NEPTUNE II (INSTRUMENTS) ×3 IMPLANT
WATER STERILE IRR 250ML POUR (IV SOLUTION) ×3 IMPLANT

## 2020-01-30 NOTE — Op Note (Signed)
[facility] Patient Name: [name] Procedure Date: 01/30/2020 11:02 AM MRN: [id number] Account #: [id number] Date of Birth: [birth year] Admit Type: Outpatient Age: 59 Room: [facility] OR ROOM 01 Gender: Female Note Status: Finalized Procedure:             Upper GI endoscopy Indications:           Heartburn Providers:             Lucilla Lame MD, MD Referring MD:          Dionne Bucy. Bacigalupo (Referring MD) Medicines:             Propofol per Anesthesia Complications:         No immediate complications. Procedure:             Pre-Anesthesia Assessment:                        - Prior to the procedure, a History and Physical was                         performed, and patient medications and allergies were                         reviewed. The patient's tolerance of previous                         anesthesia was also reviewed. The risks and benefits                         of the procedure and the sedation options and risks                         were discussed with the patient. All questions were                         answered, and informed consent was obtained. Prior                         Anticoagulants: The patient has taken no previous                         anticoagulant or antiplatelet agents. ASA Grade                         Assessment: II - A patient with mild systemic disease.                         After reviewing the risks and benefits, the patient                         was deemed in satisfactory condition to undergo the                         procedure.                        After obtaining informed consent, the endoscope was  passed under direct vision. Throughout the procedure,                         the patient's blood pressure, pulse, and oxygen                         saturations were monitored continuously. The                         Endosonoscope was introduced through the mouth, and                          advanced to the second part of duodenum. The upper GI                         endoscopy was accomplished without difficulty. The                         patient tolerated the procedure well. Findings:      A small hiatal hernia was present.      The Z-line was irregular and was found in the lower third of the       esophagus. Biopsies were taken with a cold forceps for histology.      Localized mild inflammation characterized by erythema was found in the       gastric antrum. Biopsies were taken with a cold forceps for histology.      The examined duodenum was normal. Impression:            - Small hiatal hernia.                        - Z-line irregular, in the lower third of the                         esophagus. Biopsied. Rule oiut short segment Barrett's.                        - Gastritis. Biopsied.                        - Normal examined duodenum. Recommendation:        - Discharge patient to home.                        - Resume previous diet.                        - Continue present medications.                        - Await pathology results. Procedure Code(s):     --- Professional ---                        [phone], Esophagogastroduodenoscopy, flexible,                         transoral; with biopsy, single or multiple Diagnosis Code(s):     --- Professional ---  R12, Heartburn                        K29.70, Gastritis, unspecified, without bleeding CPT copyright 2019 American Medical Association. All rights reserved. The codes documented in this report are preliminary and upon coder review may  be revised to meet current compliance requirements. Lucilla Lame MD, MD 01/30/2020 11:27:35 AM This report has been signed electronically. Number of Addenda: 0 Note Initiated On: 01/30/2020 11:02 AM Total Procedure Duration: 0 hours 2 minutes 50 seconds  Estimated Blood Loss:  Estimated blood loss: none.      [facility]

## 2020-01-30 NOTE — Anesthesia Preprocedure Evaluation (Signed)
Anesthesia Evaluation  Patient identified by MRN, date of birth, ID band Patient awake    Reviewed: Allergy & Precautions, H&P , NPO status , Patient's Chart, lab work & pertinent test results  History of Anesthesia Complications (+) PONV and history of anesthetic complications  Airway Mallampati: II  TM Distance: >3 FB Neck ROM: full    Dental no notable dental hx.    Pulmonary asthma , sleep apnea (no cpap) ,    Pulmonary exam normal        Cardiovascular Exercise Tolerance: Good hypertension, + dysrhythmias (p. afib) Atrial Fibrillation      Neuro/Psych  Headaches, Anxiety Chronic LBPain;     GI/Hepatic Neg liver ROS, GERD  Medicated,ibs   Endo/Other  negative endocrine ROS  Renal/GU negative Renal ROS  negative genitourinary   Musculoskeletal  (+) Arthritis , Ddd; Chronic neck pain;  ANTERIOR FUSION CERVICAL SPINE   Abdominal   Peds  Hematology negative hematology ROS (+)   Anesthesia Other Findings Covid + : 12/29/2019; cards: 10/21: agbor;   last eliquis: 2 days ago.  echo: 4/21: ef=55%; cath: 3/21: no cad; cardioversion: 9/21: afib to nsr; ekg: 10/21: nsr;  Reproductive/Obstetrics negative OB ROS                             Anesthesia Physical Anesthesia Plan  ASA: III  Anesthesia Plan: General   Post-op Pain Management:    Induction:   PONV Risk Score and Plan: 4 or greater and TIVA, Propofol infusion, Ondansetron, Midazolam and Treatment may vary due to age or medical condition  Airway Management Planned:   Additional Equipment:   Intra-op Plan:   Post-operative Plan:   Informed Consent: I have reviewed the patients History and Physical, chart, labs and discussed the procedure including the risks, benefits and alternatives for the proposed anesthesia with the patient or authorized representative who has indicated his/her understanding and acceptance.        Plan Discussed with: CRNA  Anesthesia Plan Comments:         Anesthesia Quick Evaluation

## 2020-01-30 NOTE — Anesthesia Postprocedure Evaluation (Signed)
Anesthesia Post Note  Patient: [name]  Procedure(s) Performed: ESOPHAGOGASTRODUODENOSCOPY (EGD) WITH PROPOFOL (N/A Mouth)     Patient location during evaluation: PACU Anesthesia Type: General Level of consciousness: awake and alert Pain management: pain level controlled Vital Signs Assessment: post-procedure vital signs reviewed and stable Respiratory status: spontaneous breathing, nonlabored ventilation, respiratory function stable and patient connected to nasal cannula oxygen Cardiovascular status: blood pressure returned to baseline and stable Postop Assessment: no apparent nausea or vomiting Anesthetic complications: no   No complications documented.  Fidel Levy

## 2020-01-30 NOTE — Anesthesia Procedure Notes (Signed)
Date/Time: 01/30/2020 11:18 AM Performed by: Cameron Ali, CRNA Pre-anesthesia Checklist: Patient identified, Emergency Drugs available, Suction available, Timeout performed and Patient being monitored Patient Re-evaluated:Patient Re-evaluated prior to induction Oxygen Delivery Method: Nasal cannula Placement Confirmation: positive ETCO2

## 2020-01-30 NOTE — Transfer of Care (Signed)
Immediate Anesthesia Transfer of Care Note  Patient: [name]  Procedure(s) Performed: ESOPHAGOGASTRODUODENOSCOPY (EGD) WITH PROPOFOL (N/A Mouth)  Patient Location: PACU  Anesthesia Type: General  Level of Consciousness: awake, alert  and patient cooperative  Airway and Oxygen Therapy: Patient Spontanous Breathing and Patient connected to supplemental oxygen  Post-op Assessment: Post-op Vital signs reviewed, Patient's Cardiovascular Status Stable, Respiratory Function Stable, Patent Airway and No signs of Nausea or vomiting  Post-op Vital Signs: Reviewed and stable  Complications: No complications documented.

## 2020-01-30 NOTE — H&P (Signed)
Lucilla Lame, MD [address] [place], [place] [address] Phone:[phone] Fax : [phone]  Primary Care Physician:  Virginia Crews, MD Primary Gastroenterologist:  Dr. Allen Norris  Pre-Procedure History & Physical: HPI:  [name] is a 59 y.o. female is here for an endoscopy.   Past Medical History:  Diagnosis Date  . A-fib ([place]) 2021  . Abnormal EKG    HX OF INVERTED T WAVES ON EKG, PALPITATIONS, CHEST PAINS-CARDIAC WORK UP DID NOT SHOW ANY HEART DISEASE  . AC (acromioclavicular) joint bone spurs   . Acute postoperative pain 01/04/2017  . 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 Anemia    Iron Infusion-8 yrs ago  . Anxiety   . Asthma    INHALERS WITH FLARE -UPS  . Cephalalgia 08/18/2014  . Cervical disc disease 08/18/2014   Needs neck surgery.   . Chronic headaches   . DDD (degenerative disc disease)    CERVICAL AND LUMBAR-CHRONIC PAIN, RT HIP LABRAL TEAR  . Depression    PT STATES A LOT OF STRESS IN HER LIFE  . Dissociative disorder   . Dizziness 04/22/2013  . Duodenal ulcer with hemorrhage and perforation ([place]) 04/27/2003  . GERD (gastroesophageal reflux disease)   . H/O arthrodesis 08/18/2014  . Headache(784.0)    AND NECK PAIN--STATES RECENT TEST SHOW CERVICAL DEGENERATION  . History of blood transfusion    s/p back surgery  . History of cardioversion   . History of cervical spinal surgery 01/04/2015  . History of kidney stones   . Hypertension   . Inverted T wave   . Narrowing of intervertebral disc space 08/18/2014   Currently on disability.   . Orthostatic hypotension 04/22/2013  . Pain    CHRONIC NECK AND BACK PAIN - LIMITED ROM NECK - S/P FUSIONS CERVICAL AND LUMBAR  . Pneumonia   . PONV (postoperative nausea and vomiting)    PT GIVES HX OF N&V AND FEVER WITH SURGERIES YEARS AGO--BUT NO PROBLEMS WITH MORE RECENT SURGERIES--STATES NOT MALIGNANT HYPERTHERMIA  . Postop Hyponatremia 05/14/2012  . Postoperative anemia due to acute blood loss 05/14/2012  .  PTSD (post-traumatic stress disorder)   . Right foot drop   . 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   . Sleep apnea 2021    Past Surgical History:  Procedure Laterality Date  . ABDOMINAL HYSTERECTOMY    . afib  05/2019  . ANTERIOR CERVICAL DECOMP/DISCECTOMY FUSION N/A 10/30/2012   Procedure: ACDF C5-6, EXPLORATION AND HARDWARE REMOVAL C6-7;  Surgeon: Melina Schools, MD;  Location: [facility];  Service: Orthopedics;  Laterality: N/A;  . ANTERIOR FUSION CERVICAL SPINE  MAY 2012   AT [facility]  . BACK SURGERY  2009   LUMBAR FUSION WITH RODS   . BREAST BIOPSY Right 2008   benign.- bx/clip  . BREAST EXCISIONAL BIOPSY Left 1998   benign  . BREAST REDUCTION SURGERY Bilateral 06/2016  . CARDIOVERSION N/A 12/03/2019   Procedure: CARDIOVERSION;  Surgeon: Kate Sable, MD;  Location: [facility];  Service: Cardiovascular;  Laterality: N/A;  . CARPAL TUNNEL RELEASE  05-06-12   Right  . CHOLECYSTECTOMY    . COLONOSCOPY WITH PROPOFOL N/A 01/26/2017   Procedure: COLONOSCOPY WITH PROPOFOL;  Surgeon: Lucilla Lame, MD;  Location: [place];  Service: Endoscopy;  Laterality: N/A;  . DIAGNOSTIC LAPAROSCOPIES - MULTIPLE FOR ENDOMETRIOSIS    . ESOPHAGEAL DILATION N/A 01/26/2017   Procedure: ESOPHAGEAL DILATION;  Surgeon: Lucilla Lame, MD;  Location: [place];  Service: Endoscopy;  Laterality: N/A;  . ESOPHAGEAL MANOMETRY N/A 05/30/2017   Procedure: ESOPHAGEAL MANOMETRY (EM);  Surgeon: Lucilla Lame, MD;  Location: [facility];  Service: Endoscopy;  Laterality: N/A;  . ESOPHAGOGASTRODUODENOSCOPY (EGD) WITH PROPOFOL N/A 01/26/2017   Procedure: ESOPHAGOGASTRODUODENOSCOPY (EGD) WITH PROPOFOL;  Surgeon: Lucilla Lame, MD;  Location: [place];  Service: Endoscopy;  Laterality: N/A;  . HIP ARTHROSCOPY  09/20/2011   Procedure: ARTHROSCOPY HIP;  Surgeon: Gearlean Alf, MD;  Location: [facility];  Service: Orthopedics;  Laterality: Right;  Right Hip  Scope with Labral Debridement  . LEFT HEART CATH AND CORONARY ANGIOGRAPHY Left 06/10/2019   Procedure: LEFT HEART CATH AND CORONARY ANGIOGRAPHY;  Surgeon: Nelva Bush, MD;  Location: [place] [place] [facility];  Service: Cardiovascular;  Laterality: Left;  . NASAL SEPTUM SURGERY  MARCH 2013   IN [place]  . [place] IMPEDANCE STUDY N/A 05/30/2017   Procedure: [place] IMPEDANCE STUDY;  Surgeon: Lucilla Lame, MD;  Location: [facility];  Service: Endoscopy;  Laterality: N/A;  . POLYPECTOMY N/A 01/26/2017   Procedure: POLYPECTOMY;  Surgeon: Lucilla Lame, MD;  Location: [place];  Service: Endoscopy;  Laterality: N/A;  . POSTERIOR CERVICAL FUSION/FORAMINOTOMY N/A 04/16/2013   Procedure: REMOVAL CERVICAL PLATES AND INTERBODY CAGE/POSTERIOR CERVICAL SPINAL FUSION C4 - C6/C5 CORPECTOMY/C4 - C6 FUSION WITH ILIAC CREST BONE GRAFT;  Surgeon: Melina Schools, MD;  Location: [place];  Service: Orthopedics;  Laterality: N/A;  . RADIOFREQUENCY ABLATION NERVES    . REDUCTION MAMMAPLASTY Bilateral 05/2016  . RIGHT HIP ARTHROSCOPY FOR LABRAL TEAR  ABOUT 2010   2012 also  . SHOULDER ARTHROSCOPY  05-06-12   bone spur  . TONSILLECTOMY     AS A CHILD  . TOTAL HIP ARTHROPLASTY Right 05/13/2012   Procedure: TOTAL HIP ARTHROPLASTY ANTERIOR APPROACH;  Surgeon: Gearlean Alf, MD;  Location: [facility];  Service: Orthopedics;  Laterality: Right;    Prior to Admission medications   Medication Sig Start Date End Date Taking? Authorizing Provider  acetaminophen (TYLENOL) 500 MG tablet Take 500-1,000 mg by mouth every 6 (six) hours as needed for moderate pain.    Yes [provider]  albuterol (PROVENTIL HFA) 108 (90 Base) MCG/ACT inhaler Inhale 1-2 puffs into the lungs every 4 (four) hours as needed for wheezing or shortness of breath.    Yes [provider]  ALPRAZolam Duanne Moron) 1 MG tablet Take 1 mg by mouth at bedtime.  07/04/19  Yes [provider]  amiodarone (PACERONE) 200 MG tablet Take 1 tablet  (200 mg total) by mouth daily. 12/19/19  Yes Agbor-Etang, Aaron Edelman, MD  apixaban (ELIQUIS) 5 MG TABS tablet Take 1 tablet (5 mg total) by mouth 2 (two) times daily. 10/21/19  Yes Agbor-Etang, Aaron Edelman, MD  cyclobenzaprine (FLEXERIL) 5 MG tablet Take 1 tablet (5 mg total) by mouth 3 (three) times daily as needed for muscle spasms. 07/04/19  Yes Bacigalupo, Dionne Bucy, MD  Fluticasone-Salmeterol (ADVAIR) 500-50 MCG/DOSE AEPB Inhale 1 puff into the lungs 2 (two) times daily as needed (cold symptoms).    Yes [provider]  linaclotide (LINZESS) 290 MCG CAPS capsule Take 1 capsule (290 mcg total) by mouth daily before breakfast. 12/24/19  Yes Allen Norris, Janmarie Smoot, MD  montelukast (SINGULAIR) 10 MG tablet TAKE 1 TABLET BY MOUTH EVERY DAY 12/21/19  Yes Bacigalupo, Dionne Bucy, MD  morphine (MSIR) 15 MG tablet Take 1 tablet (15 mg total) by mouth every 6 (six) hours as needed for moderate pain or  severe pain. Must last 30 days. 11/23/19 01/13/20 Yes Milinda Pointer, MD  ondansetron (ZOFRAN) 4 MG tablet Take 1 tablet (4 mg total) by mouth every 8 (eight) hours as needed for nausea or vomiting. 12/19/19  Yes Birdie Sons, MD  pantoprazole (PROTONIX) 40 MG tablet TAKE 1 TABLET BY MOUTH TWICE A DAY Patient taking differently: Take 40 mg by mouth 2 (two) times daily.  10/20/19  Yes Lucilla Lame, MD  valACYclovir (VALTREX) 1000 MG tablet TAKE TWO TABLETS BY MOUTH TWICE DAILY FOR ONE DAY FEVER BLISTER Patient taking differently: Take 2,000 mg by mouth 2 (two) times daily as needed (fever blisters).  05/10/18  Yes Bacigalupo, Dionne Bucy, MD  calcium carbonate (TUMS - DOSED IN MG ELEMENTAL CALCIUM) 500 MG chewable tablet Chew 4 tablets by mouth daily as needed for heartburn.  Patient not taking: Reported on 01/13/2020    [provider]  Menthol, Topical Analgesic, (BENGAY EX) Apply 1 application topically daily as needed (pain).    [provider]  morphine (MSIR) 15 MG tablet Take 1 tablet (15 mg total) by  mouth every 6 (six) hours as needed for moderate pain or severe pain. Must last 30 days. 12/23/19 01/22/20  Milinda Pointer, MD  morphine (MSIR) 15 MG tablet Take 1 tablet (15 mg total) by mouth every 6 (six) hours as needed for moderate pain or severe pain. Must last 30 days. 01/22/20 02/21/20  Milinda Pointer, MD  oxymetazoline (AFRIN) 0.05 % nasal spray Place 1 spray into both nostrils 2 (two) times daily as needed for congestion. Patient not taking: Reported on 01/13/2020    [provider]  Spacer/Aero-Holding Chambers (AEROCHAMBER MV) inhaler by Does not apply route. 06/24/14   [provider]    Allergies as of 01/08/2020 - Review Complete 01/07/2020  Allergen Reaction Noted  . Amoxicillin Hives 08/20/2014  . Chlorhexidine gluconate Dermatitis and Hives 07/06/2016  . Clindamycin Hives 08/20/2014  . Codeine Hives 08/21/2011  . Erythromycin Hives 07/06/2016  . Penicillin g Hives 07/06/2016  . Sulfa antibiotics Nausea And Vomiting and Hives 09/06/2011  . Levofloxacin Hives 02/18/2018  . Shellfish allergy Hives 10/08/2012  . Decadron [dexamethasone] Other (See Comments) 09/20/2011  . Mangifera indica Hives 07/20/2016  . Papaya derivatives Hives 10/30/2012  . Betadine [povidone iodine] Hives 09/06/2011  . Clarithromycin Hives 09/08/2011  . Other Hives 09/06/2011  . Povidone-iodine Hives 09/06/2011  . Prednisone Anxiety 08/18/2014    Family History  Problem Relation Age of Onset  . Aneurysm Mother   . Aneurysm Maternal Grandmother   . Breast cancer Paternal Grandmother 25  . Bipolar disorder Sister   . Bipolar disorder Grandchild   . Anxiety disorder Grandchild   . Depression Grandchild     Social History   Socioeconomic History  . Marital status: Married    Spouse name: bruce  . Number of children: 2  . Years of education: Not on file  . Highest education level: Associate degree: occupational, Hotel manager, or vocational program  Occupational  History    Comment: disabled  Tobacco Use  . Smoking status: Never Smoker  . Smokeless tobacco: Never Used  Vaping Use  . Vaping Use: Never used  Substance and Sexual Activity  . Alcohol use: No    Alcohol/week: 0.0 standard drinks  . Drug use: No  . Sexual activity: Yes  Other Topics Concern  . Not on file  Social History Narrative   Son, daughter  And 4 grandkids; raises 3 of the grandchildren  Social Determinants of Health   Financial Resource Strain: High Risk  . Difficulty of Paying Living Expenses: Very hard  Food Insecurity: Food Insecurity Present  . Worried About Charity fundraiser in the Last Year: Sometimes true  . Ran Out of Food in the Last Year: Often true  Transportation Needs: Unmet Transportation Needs  . Lack of Transportation (Medical): Yes  . Lack of Transportation (Non-Medical): Yes  Physical Activity: Inactive  . Days of Exercise per Week: 0 days  . Minutes of Exercise per Session: 0 min  Stress: Stress Concern Present  . Feeling of Stress : Very much  Social Connections: Moderately Isolated  . Frequency of Communication with Friends and Family: More than three times a week  . Frequency of Social Gatherings with Friends and Family: Never  . Attends Religious Services: Never  . Active Member of Clubs or Organizations: No  . Attends Archivist Meetings: Never  . Marital Status: Married  Human resources officer Violence: Not At Risk  . Fear of Current or Ex-Partner: No  . Emotionally Abused: No  . Physically Abused: No  . Sexually Abused: No    Review of Systems: See HPI, otherwise negative ROS  Physical Exam: BP (!) 162/82   Pulse 67   Temp (!) 97.4 F (36.3 C) (Temporal)   Wt 65.7 kg   SpO2 100%   BMI 24.85 kg/m  General:   Alert,  pleasant and cooperative in NAD Head:  Normocephalic and atraumatic. Neck:  Supple; no masses or thyromegaly. Lungs:  Clear throughout to auscultation.    Heart:  Regular rate and rhythm. Abdomen:   Soft, nontender and nondistended. Normal bowel sounds, without guarding, and without rebound.   Neurologic:  Alert and  oriented x4;  grossly normal neurologically.  Impression/Plan: [name] is here for an endoscopy to be performed for gerd  Risks, benefits, limitations, and alternatives regarding  endoscopy have been reviewed with the patient.  Questions have been answered.  All parties agreeable.   Lucilla Lame, MD  01/30/2020, 11:02 AM

## 2020-02-02 ENCOUNTER — Encounter: Payer: Medicare Other | Admitting: Pain Medicine

## 2020-02-02 ENCOUNTER — Encounter: Payer: Self-pay | Admitting: Gastroenterology

## 2020-02-02 LAB — SURGICAL PATHOLOGY

## 2020-02-03 ENCOUNTER — Encounter: Payer: Self-pay | Admitting: Gastroenterology

## 2020-02-04 ENCOUNTER — Ambulatory Visit
Admission: RE | Admit: 2020-02-04 | Discharge: 2020-02-04 | Disposition: A | Discharge status: Home / Self Care | Payer: Medicare Other | Source: Ambulatory Visit | Attending: Surgery | Admitting: Surgery

## 2020-02-04 ENCOUNTER — Telehealth: Payer: Self-pay

## 2020-02-04 ENCOUNTER — Other Ambulatory Visit: Payer: Self-pay

## 2020-02-04 DIAGNOSIS — K449 Diaphragmatic hernia without obstruction or gangrene: Secondary | ICD-10-CM | POA: Diagnosis not present

## 2020-02-04 DIAGNOSIS — Q630 Accessory kidney: Secondary | ICD-10-CM | POA: Diagnosis not present

## 2020-02-04 DIAGNOSIS — K3189 Other diseases of stomach and duodenum: Secondary | ICD-10-CM | POA: Diagnosis not present

## 2020-02-04 DIAGNOSIS — Z981 Arthrodesis status: Secondary | ICD-10-CM | POA: Diagnosis not present

## 2020-02-04 DIAGNOSIS — K6389 Other specified diseases of intestine: Secondary | ICD-10-CM | POA: Diagnosis not present

## 2020-02-04 MED ORDER — IOHEXOL 300 MG/ML  SOLN
100.0000 mL | Freq: Once | INTRAMUSCULAR | Status: AC | PRN
  Administered 2020-02-04: 100 mL via INTRAVENOUS

## 2020-02-04 NOTE — Telephone Encounter (Signed)
Patient was notified of recent CT results and made aware of upcoming scheduled appointment with Dr.Pabon. Patient verbalized understanding and has no further questions.

## 2020-02-04 NOTE — Telephone Encounter (Signed)
-----   Message from Jules Husbands, MD sent at 02/04/2020  4:01 PM EST ----- Please let her knwo CT scan did not show any major abnormalities, please keep appt  w me ----- Message ----- From: Interface, Rad Results In Sent: 02/04/2020   3:46 PM EST To: Jules Husbands, MD

## 2020-02-08 NOTE — Progress Notes (Signed)
PROVIDER NOTE: Information contained herein reflects review and annotations entered in association with encounter. Interpretation of such information and data should be left to medically-trained personnel. Information provided to patient can be located elsewhere in the medical record under "Patient Instructions". Document created using STT-dictation technology, any transcriptional errors that may result from process are unintentional.    Patient: [name]  Service Category: E/M  Provider: Gaspar Cola, MD  DOB: 22-Feb-1961  DOS: 02/09/2020  Specialty: Interventional Pain Management  MRN: [id number]  Setting: Ambulatory outpatient  PCP: Virginia Crews, MD  Type: Established Patient    Referring Provider: Virginia Crews, MD  Location: Office  Delivery: Face-to-face     HPI  [name], a 59 y.o. year old female, is here today because of her Chronic pain syndrome [G89.4]. [name] primary complain today is No chief complaint on file. Last encounter: My last encounter with her was on 11/19/2019. Pertinent problems: 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); Chronic hip pain (Secondary Area of Pain) (Bilateral) (L>R); Chronic shoulder arthropathy (Left); Trigger point of  shoulder region (Left); Enthesopathy of hip region (Left); Groin pain, chronic, left; Other intervertebral disc degeneration, lumbar region; History of lumbar surgery; DDD (degenerative disc disease), lumbosacral; Neuropathic pain; Neurogenic pain; Spondylosis without myelopathy or radiculopathy, lumbosacral region; Chronic musculoskeletal pain; Cervical radiculitis (C4,C5,C8) (Left); DDD (degenerative disc disease), cervical; Foraminal stenosis of cervical region (C3-4) (Right); and 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 Pain Assessment: Severity of Chronic pain is reported as a 3 /10. Location: Neck (shoulder) Right, Left, Lower/radiaties down her shoulder. Onset: More than a month ago. Quality: Aching, Tightness. Timing: Intermittent. Modifying factor(s): maasage, heat, meds help a little. Vitals:  height is _0  (1.626 m) and weight is 144 lb (65.3 kg). Her temperature is 97.2 F (36.2 C) (abnormal). Her blood pressure is 105/92 (abnormal) and her pulse is 65. Her oxygen saturation is 100%.   Reason for encounter: medication management.  The patient indicates doing well with the current medication regimen. No adverse reactions or side effects reported to the medications. PMP compliant.  She is pending to have abdominal surgery for a hiatal hernia.  She is not sure of the date of the surgery yet since she is seeing the surgeon on Wednesday to schedule that.  Today I will go ahead and provide the patient with the letter for the surgeons with recommendations on how to manage the acute pain.  RTCB: 05/21/2020 No nonopioids to transfer.  Pharmacotherapy Assessment   Analgesic: Oxycodone IR 10 mg, 1 tab PO q 6 hrs (40 mg/day of oxycodone) MME/day: 60 mg/day.   Monitoring: [place] PMP: PDMP reviewed during this encounter.       Pharmacotherapy: No side-effects or adverse reactions reported. Compliance: No problems identified. Effectiveness: Clinically  acceptable.  Chauncey Fischer, RN  02/09/2020 11:36 AM  Sign when Signing Visit Nursing Pain Medication  Assessment:  Safety precautions to be maintained throughout the outpatient stay will include: orient to surroundings, keep bed in low position, maintain call bell within reach at all times, provide assistance with transfer out of bed and ambulation.  Medication Inspection Compliance: Pill count conducted under aseptic conditions, in front of the patient. Neither the pills nor the bottle was removed from the patient's sight at any time. Once count was completed pills were immediately returned to the patient in their original bottle.  Medication: Morphine IR Pill/Patch Count: 68 of 120 pills remain Pill/Patch Appearance: Markings consistent with prescribed medication Bottle Appearance: Standard pharmacy container. Clearly labeled. Filled Date: 53 / 15 / 21 Last Medication intake:  TodaySafety precautions to be maintained throughout the outpatient stay will include: orient to surroundings, keep bed in low position, maintain call bell within reach at all times, provide assistance with transfer out of bed and ambulation.     UDS:  Summary  Date Value Ref Range Status  08/20/2019 Note  Final    Comment:    ==================================================================== [employer] (MW) ==================================================================== Test                             Result       Flag       Units  Drug Present and Declared for Prescription Verification   Alprazolam                     132          EXPECTED   ng/mg creat   Alpha-hydroxyalprazolam        293          EXPECTED   ng/mg creat    Source of alprazolam is a scheduled prescription medication. Alpha-    hydroxyalprazolam is an expected metabolite of alprazolam.    Oxycodone                      2443         EXPECTED   ng/mg creat   Oxymorphone                    922          EXPECTED   ng/mg creat    Noroxycodone                   >9434        EXPECTED   ng/mg creat   Noroxymorphone                 225          EXPECTED   ng/mg creat    Sources of oxycodone are scheduled prescription medications.    Oxymorphone, noroxycodone, and noroxymorphone are expected    metabolites of oxycodone. Oxymorphone is also available as a    scheduled prescription medication.  ==================================================================== Test                      Result    Flag   Units      Ref Range   Creatinine              106              mg/dL      >=20 ==================================================================== Declared Medications:  The flagging and interpretation on this report are based on the  following declared medications.  Unexpected results may arise from  inaccuracies in the declared medications.   **Note: The testing scope of this panel includes these medications:   Alprazolam (Xanax)  Oxycodone   **Note: The testing scope of this panel does not include the  following reported medications:   Acetaminophen (Tylenol)  Albuterol (Ventolin HFA)  Apixaban (Eliquis)  Calcium (Tums)  Cyclobenzaprine (Flexeril)  Fluticasone (Advair)  Furosemide (Lasix)  Lubiprostone (Amitiza)  Metoprolol (Toprol)  Omeprazole (Prilosec)  Ondansetron (Zofran)  Pantoprazole (Protonix)  Salmeterol (Advair)  Valacyclovir (Valtrex) ==================================================================== For clinical consultation, please call [phone]. ====================================================================      ROS  Constitutional: Denies any fever or chills Gastrointestinal: No reported hemesis, hematochezia, vomiting, or acute GI distress Musculoskeletal: Denies any acute onset joint swelling, redness, loss of ROM, or weakness Neurological: No reported episodes of acute onset apraxia, aphasia, dysarthria, agnosia, amnesia, paralysis, loss of coordination, or loss of  consciousness  Medication Review  ALPRAZolam, AeroChamber MV, Fluticasone-Salmeterol, Menthol (Topical Analgesic), acetaminophen, albuterol, amiodarone, apixaban, calcium carbonate, cyclobenzaprine, linaclotide, montelukast, morphine, ondansetron, oxymetazoline, pantoprazole, and valACYclovir  History Review  Allergy: Ms. Delatorre is allergic to amoxicillin, chlorhexidine gluconate, clindamycin, codeine, erythromycin, penicillin g, sulfa antibiotics, levofloxacin, shellfish allergy, decadron [dexamethasone], mangifera indica, papaya derivatives, betadine [povidone iodine], clarithromycin, other, povidone-iodine, and prednisone. Drug: Ms. Cullinane  reports no history of drug use. Alcohol:  reports no history of alcohol use. Tobacco:  reports that she has never smoked. She has never used smokeless tobacco. Social: Ms. Ihrig  reports that she has never smoked. She has never used smokeless tobacco. She reports that she does not drink alcohol and does not use drugs. Medical:  has a past medical history of A-fib ([place]) (2021), Abnormal EKG, AC (acromioclavicular) joint bone spurs, Acute postoperative pain (01/04/2017), [place] anemia (08/15/2004), Anemia, Anxiety, Asthma, Cephalalgia (08/18/2014), Cervical disc disease (08/18/2014), Chronic headaches, DDD (degenerative disc disease), Depression, Dissociative disorder, Dizziness (04/22/2013), Duodenal ulcer with hemorrhage and perforation ([place]) (04/27/2003), GERD (gastroesophageal reflux disease), H/O arthrodesis (08/18/2014), Headache(784.0), History of blood transfusion, History of cardioversion, History of cervical spinal surgery (01/04/2015), History of kidney stones, Hypertension, Inverted T wave, Narrowing of intervertebral disc space (08/18/2014), Orthostatic hypotension (04/22/2013), Pain, Pneumonia, PONV (postoperative nausea and vomiting), Postop Hyponatremia (05/14/2012), Postoperative anemia due to acute blood loss (05/14/2012), PTSD (post-traumatic stress disorder),  Right foot drop, Right hip arthralgia (08/18/2014), and Sleep apnea (2021). Surgical: Ms. Difatta  has a past surgical history that includes RIGHT HIP ARTHROSCOPY FOR LABRAL TEAR (ABOUT 2010); Anterior fusion cervical spine (MAY 2012); Nasal septum surgery (MARCH 2013); Back surgery (2009); Tonsillectomy; Abdominal hysterectomy; DIAGNOSTIC LAPAROSCOPIES - MULTIPLE FOR ENDOMETRIOSIS; Cholecystectomy; Hip arthroscopy (09/20/2011); Shoulder arthroscopy (05-06-12); Carpal tunnel release (05-06-12); Total hip arthroplasty (Right, 05/13/2012); Anterior cervical decomp/discectomy fusion (N/A, 10/30/2012); Posterior cervical fusion/foraminotomy (N/A, 04/16/2013); Breast reduction surgery (Bilateral, 06/2016); Colonoscopy with propofol (N/A, 01/26/2017); Esophagogastroduodenoscopy (egd) with propofol (N/A, 01/26/2017); Esophageal dilation (N/A, 01/26/2017); polypectomy (N/A, 01/26/2017); PH impedance study (N/A, 05/30/2017); Esophageal manometry (N/A, 05/30/2017); Radiofrequency ablation nerves; Reduction mammaplasty (Bilateral, 05/2016); Breast biopsy (Right, 2008); Breast excisional biopsy (Left, 1998); afib (05/2019); LEFT HEART CATH AND CORONARY ANGIOGRAPHY (Left, 06/10/2019); Cardioversion (N/A, 12/03/2019); and Esophagogastroduodenoscopy (egd) with propofol (N/A, 01/30/2020). Family: family history includes Aneurysm in her maternal grandmother and mother; Anxiety disorder in her grandchild; Bipolar disorder in her grandchild and sister; Breast cancer (age of onset: 3) in her paternal grandmother; Depression in her grandchild.  Laboratory Chemistry Profile   Renal Lab Results  Component Value Date   BUN 10 11/26/2019   CREATININE  0.77 01/14/2020   BCR 7 (L) 11/11/2018   GFRAA >60 11/26/2019   GFRNONAA >60 01/14/2020     Hepatic Lab Results  Component Value Date   AST 15 01/13/2020   ALT 8 01/13/2020   ALBUMIN 4.2 01/13/2020   ALKPHOS 111 01/13/2020   LIPASE 23 04/06/2015     Electrolytes Lab Results   Component Value Date   NA 137 11/26/2019   K 3.1 (L) 11/26/2019   CL 101 11/26/2019   CALCIUM 9.5 11/26/2019   MG 1.9 11/11/2018     Bone Lab Results  Component Value Date   25OHVITD1 23 (L) 11/11/2018   25OHVITD2 9.8 11/11/2018   25OHVITD3 13 11/11/2018     Inflammation (CRP: Acute Phase) (ESR: Chronic Phase) Lab Results  Component Value Date   CRP 12 (H) 11/11/2018   ESRSEDRATE 42 (H) 11/11/2018       Note: Above Lab results reviewed.  Recent Imaging Review  CT ABDOMEN PELVIS W CONTRAST CLINICAL DATA:  Mid abdominal pain, acid reflux  EXAM: CT ABDOMEN AND PELVIS WITH CONTRAST  TECHNIQUE: Multidetector CT imaging of the abdomen and pelvis was performed using the standard protocol following bolus administration of intravenous contrast.  CONTRAST:  128m OMNIPAQUE IOHEXOL 300 MG/ML  SOLN  COMPARISON:  08/28/2016  FINDINGS: Lower chest: Lung bases are clear. Heart size is normal. Visualized distal esophagus appears unremarkable.  Hepatobiliary: No focal liver abnormality is seen. Status post cholecystectomy. No biliary dilatation.  Pancreas: Unremarkable. No pancreatic ductal dilatation or surrounding inflammatory changes.  Spleen: Normal in size without focal abnormality.  Adrenals/Urinary Tract: Unremarkable adrenal glands. Focal area of cortical scarring at the upper pole of the right kidney. Kidneys have otherwise symmetric enhancement. No focal renal lesion, stone, or hydronephrosis. Duplicated left renal collecting system. Urinary bladder appears within normal limits.  Stomach/Bowel: Stomach and small bowel are well distended with oral contrast. Stomach appears unremarkable. No dilated loops of small bowel. Normal appendix is present in the right lower quadrant (series 2, image 61). Slight under distension of the sigmoid colon without definite focal colonic wall thickening. No pericolonic inflammatory changes.  Vascular/Lymphatic: No significant  vascular findings are present. No enlarged abdominal or pelvic lymph nodes.  Reproductive: Status post hysterectomy. No adnexal masses.  Other: No free fluid. No abdominopelvic fluid collection. No pneumoperitoneum. No abdominal wall hernia.  Musculoskeletal: Thoracolumbar fusion hardware extending from T10-L4. Prior right total hip arthroplasty. No new or acute osseous findings.  IMPRESSION: 1. No acute abdominopelvic findings. 2. Slight under distension of the sigmoid colon without definite focal colonic wall thickening or pericolonic inflammatory changes. A mild colitis is not excluded. 3. Duplicated left renal collecting system without evidence of obstructive uropathy.  Electronically Signed   By: NDavina PokeD.O.   On: 02/04/2020 15:43 Note: Reviewed        Physical Exam  General appearance: Well nourished, well developed, and well hydrated. In no apparent acute distress Mental status: Alert, oriented x 3 (person, place, & time)       Respiratory: No evidence of acute respiratory distress Eyes: PERLA Vitals: BP (!) 105/92   Pulse 65   Temp (!) 97.2 F (36.2 C)   Ht _0  (1.626 m)   Wt 144 lb (65.3 kg)   SpO2 100%   BMI 24.72 kg/m  BMI: Estimated body mass index is 24.72 kg/m as calculated from the following:   Height as of this encounter: _1  (1.626 m).   Weight as of this  encounter: 144 lb (65.3 kg). Ideal: Ideal body weight: 54.7 kg (120 lb 9.5 oz) Adjusted ideal body weight: 58.9 kg (129 lb 15.3 oz)  Assessment   Status Diagnosis  Controlled Controlled Controlled 1. Chronic pain syndrome   2. Chronic low back pain (Primary area of Pain) (Bilateral) (L>R) w/o sciatica   3. Cervical radiculitis (C4,C5,C8) (Left)   4. Cervical facet syndrome (Bilateral)   5. Pharmacologic therapy      Updated Problems: No problems updated.  Plan of Care  Problem-specific:  No problem-specific Assessment & Plan notes found for this encounter.  Ms. KYE SILVERSTEIN has a current medication list which includes the following long-term medication(s): amiodarone, apixaban, linaclotide, montelukast, pantoprazole, albuterol, [START ON 02/21/2020] morphine, [START ON 03/22/2020] morphine, and [START ON 04/21/2020] morphine.  Pharmacotherapy (Medications Ordered): Meds ordered this encounter  Medications  . morphine (MSIR) 15 MG tablet    Sig: Take 1 tablet (15 mg total) by mouth every 6 (six) hours as needed for moderate pain or severe pain. Must last 30 days.    Dispense:  120 tablet    Refill:  0    Chronic Pain: STOP Act (Not applicable) Fill 1 day early if closed on refill date. Avoid benzodiazepines within 8 hours of opioids  . morphine (MSIR) 15 MG tablet    Sig: Take 1 tablet (15 mg total) by mouth every 6 (six) hours as needed for moderate pain or severe pain. Must last 30 days.    Dispense:  120 tablet    Refill:  0    Chronic Pain: STOP Act (Not applicable) Fill 1 day early if closed on refill date. Avoid benzodiazepines within 8 hours of opioids  . morphine (MSIR) 15 MG tablet    Sig: Take 1 tablet (15 mg total) by mouth every 6 (six) hours as needed for moderate pain or severe pain. Must last 30 days.    Dispense:  120 tablet    Refill:  0    Chronic Pain: STOP Act (Not applicable) Fill 1 day early if closed on refill date. Avoid benzodiazepines within 8 hours of opioids   Orders:  No orders of the defined types were placed in this encounter.  Follow-up plan:   Return in about 3 months (around 05/21/2020) for (F2F), (Med Mgmt).      Interventional management options: Considering:   NOTE:  1. SHELLFISH ALLERGY; Dexamethasone & Prednisone allergy. 2. NOT a candidate for lumbar facet RFA (due to hardware). 3. NOT a candidate for membrane stabilizers (Neurontin/Lyrica) due to prior failed trials. 4. NOT a candidate for SCS (due to hardware). 5. NOT a candidate for IT Pump (patient's choice due to surgical  risks).  Possiblerightsuprascapular nerve RFA #1   Palliative PRN treatment(s):   Diagnostic bilateral lumbar facet block #2 (No benefit in doing injection w/o steroids.) Palliativebilateral suprascapular NB #3(No Steroids) Palliative leftsuprascapular nerve RFA #2(last done on 01/04/2017)     Recent Visits Date Type Provider Dept  11/19/19 Office Visit Milinda Pointer, MD [facility]  Showing recent visits within past 90 days and meeting all other requirements Today's Visits Date Type Provider Dept  02/09/20 Office Visit Milinda Pointer, MD [facility]  Showing today's visits and meeting all other requirements Future Appointments No visits were found meeting these conditions. Showing future appointments within next 90 days and meeting all other requirements  I discussed the assessment and treatment plan with the patient. The patient was provided an opportunity to ask questions  and all were answered. The patient agreed with the plan and demonstrated an understanding of the instructions.  Patient advised to call back or seek an in-person evaluation if the symptoms or condition worsens.  Duration of encounter: 30 minutes.  Note by: Gaspar Cola, MD Date: 02/09/2020; Time: 11:58 AM

## 2020-02-09 ENCOUNTER — Encounter: Payer: Self-pay | Admitting: Pain Medicine

## 2020-02-09 ENCOUNTER — Other Ambulatory Visit: Payer: Self-pay

## 2020-02-09 ENCOUNTER — Ambulatory Visit: Payer: Medicare Other | Attending: Pain Medicine | Admitting: Pain Medicine

## 2020-02-09 VITALS — BP 105/92 | HR 65 | Temp 97.2°F | Ht 64.0 in | Wt 144.0 lb

## 2020-02-09 DIAGNOSIS — M545 Low back pain, unspecified: Secondary | ICD-10-CM | POA: Insufficient documentation

## 2020-02-09 DIAGNOSIS — G8929 Other chronic pain: Secondary | ICD-10-CM | POA: Insufficient documentation

## 2020-02-09 DIAGNOSIS — G894 Chronic pain syndrome: Secondary | ICD-10-CM | POA: Insufficient documentation

## 2020-02-09 DIAGNOSIS — Z79899 Other long term (current) drug therapy: Secondary | ICD-10-CM | POA: Insufficient documentation

## 2020-02-09 DIAGNOSIS — M5412 Radiculopathy, cervical region: Secondary | ICD-10-CM | POA: Insufficient documentation

## 2020-02-09 DIAGNOSIS — M47812 Spondylosis without myelopathy or radiculopathy, cervical region: Secondary | ICD-10-CM | POA: Insufficient documentation

## 2020-02-09 MED ORDER — MORPHINE SULFATE 15 MG PO TABS: 15.0000 mg | ORAL_TABLET | Freq: Four times a day (QID) | ORAL | 0 refills | Status: DC | PRN

## 2020-02-09 NOTE — Progress Notes (Signed)
Nursing Pain Medication Assessment:  Safety precautions to be maintained throughout the outpatient stay will include: orient to surroundings, keep bed in low position, maintain call bell within reach at all times, provide assistance with transfer out of bed and ambulation.  Medication Inspection Compliance: Pill count conducted under aseptic conditions, in front of the patient. Neither the pills nor the bottle was removed from the patient's sight at any time. Once count was completed pills were immediately returned to the patient in their original bottle.  Medication: Morphine IR Pill/Patch Count: 68 of 120 pills remain Pill/Patch Appearance: Markings consistent with prescribed medication Bottle Appearance: Standard pharmacy container. Clearly labeled. Filled Date: 42 / 15 / 21 Last Medication intake:  TodaySafety precautions to be maintained throughout the outpatient stay will include: orient to surroundings, keep bed in low position, maintain call bell within reach at all times, provide assistance with transfer out of bed and ambulation.

## 2020-02-09 NOTE — Patient Instructions (Signed)
____________________________________________________________________________________________  Medication Rules  Purpose: To inform patients, and their family members, of our rules and regulations.  Applies to: All patients receiving prescriptions (written or electronic).  Pharmacy of record: Pharmacy where electronic prescriptions will be sent. If written prescriptions are taken to a different pharmacy, please inform the nursing staff. The pharmacy listed in the electronic medical record should be the one where you would like electronic prescriptions to be sent.  Electronic prescriptions: In compliance with the [place] Strengthen Opioid Misuse Prevention (STOP) Act of 2017 (Session Law 2017-74/H243), effective March 13, 2018, all controlled substances must be electronically prescribed. Calling prescriptions to the pharmacy will cease to exist.  Prescription refills: Only during scheduled appointments. Applies to all prescriptions.  NOTE: The following applies primarily to controlled substances (Opioid* Pain Medications).   Type of encounter (visit): For patients receiving controlled substances, face-to-face visits are required. (Not an option or up to the patient.)  Patient's responsibilities: 1. Pain Pills: Bring all pain pills to every appointment (except for procedure appointments). 2. Pill Bottles: Bring pills in original pharmacy bottle. Always bring the newest bottle. Bring bottle, even if empty. 3. Medication refills: You are responsible for knowing and keeping track of what medications you take and those you need refilled. The day before your appointment: write a list of all prescriptions that need to be refilled. The day of the appointment: give the list to the admitting nurse. Prescriptions will be written only during appointments. No prescriptions will be written on procedure days. If you forget a medication: it will not be "Called in", "Faxed", or "electronically sent".  You will need to get another appointment to get these prescribed. No early refills. Do not call asking to have your prescription filled early. 4. Prescription Accuracy: You are responsible for carefully inspecting your prescriptions before leaving our office. Have the discharge nurse carefully go over each prescription with you, before taking them home. Make sure that your name is accurately spelled, that your address is correct. Check the name and dose of your medication to make sure it is accurate. Check the number of pills, and the written instructions to make sure they are clear and accurate. Make sure that you are given enough medication to last until your next medication refill appointment. 5. Taking Medication: Take medication as prescribed. When it comes to controlled substances, taking less pills or less frequently than prescribed is permitted and encouraged. Never take more pills than instructed. Never take medication more frequently than prescribed.  6. Inform other Doctors: Always inform, all of your healthcare providers, of all the medications you take. 7. Pain Medication from other Providers: You are not allowed to accept any additional pain medication from any other Doctor or Healthcare provider. There are two exceptions to this rule. (see below) In the event that you require additional pain medication, you are responsible for notifying us, as stated below. 8. Cough Medicine: Often these contain an opioid, such as codeine or hydrocodone. Never accept or take cough medicine containing these opioids if you are already taking an opioid* medication. The combination may cause respiratory failure and death. 9. Medication Agreement: You are responsible for carefully reading and following our Medication Agreement. This must be signed before receiving any prescriptions from our practice. Safely store a copy of your signed Agreement. Violations to the Agreement will result in no further prescriptions.  (Additional copies of our Medication Agreement are available upon request.) 10. Laws, Rules, & Regulations: All patients are expected to follow all   Federal and State Laws, Statutes, Rules, & Regulations. Ignorance of the Laws does not constitute a valid excuse.  11. Illegal drugs and Controlled Substances: The use of illegal substances (including, but not limited to marijuana and its derivatives) and/or the illegal use of any controlled substances is strictly prohibited. Violation of this rule may result in the immediate and permanent discontinuation of any and all prescriptions being written by our practice. The use of any illegal substances is prohibited. 12. Adopted CDC guidelines & recommendations: Target dosing levels will be at or below 60 MME/day. Use of benzodiazepines** is not recommended.  Exceptions: There are only two exceptions to the rule of not receiving pain medications from other Healthcare Providers. 1. Exception #1 (Emergencies): In the event of an emergency (i.e.: accident requiring emergency care), you are allowed to receive additional pain medication. However, you are responsible for: As soon as you are able, call our office [phone], at any time of the day or night, and leave a message stating your name, the date and nature of the emergency, and the name and dose of the medication prescribed. In the event that your call is answered by a member of our staff, make sure to document and save the date, time, and the name of the person that took your information.  2. Exception #2 (Planned Surgery): In the event that you are scheduled by another doctor or dentist to have any type of surgery or procedure, you are allowed (for a period no longer than 30 days), to receive additional pain medication, for the acute post-op pain. However, in this case, you are responsible for picking up a copy of our "Post-op Pain Management for Surgeons" handout, and giving it to your surgeon or dentist. This  document is available at our office, and does not require an appointment to obtain it. Simply go to our office during business hours (Monday-Thursday from 8:00 AM to 4:00 PM) (Friday 8:00 AM to 12:00 Noon) or if you have a scheduled appointment with us, prior to your surgery, and ask for it by name. In addition, you are responsible for: calling our office [phone], at any time of the day or night, and leaving a message stating your name, name of your surgeon, type of surgery, and date of procedure or surgery. Failure to comply with your responsibilities may result in termination of therapy involving the controlled substances.  *Opioid medications include: morphine, codeine, oxycodone, oxymorphone, hydrocodone, hydromorphone, meperidine, tramadol, tapentadol, buprenorphine, fentanyl, methadone. **Benzodiazepine medications include: diazepam (Valium), alprazolam (Xanax), clonazepam (Klonopine), lorazepam (Ativan), clorazepate (Tranxene), chlordiazepoxide (Librium), estazolam (Prosom), oxazepam (Serax), temazepam (Restoril), triazolam (Halcion) (Last updated: 02/09/2020) ____________________________________________________________________________________________   ____________________________________________________________________________________________  Medication Recommendations and Reminders  Applies to: All patients receiving prescriptions (written and/or electronic).  Medication Rules & Regulations: These rules and regulations exist for your safety and that of others. They are not flexible and neither are we. Dismissing or ignoring them will be considered "non-compliance" with medication therapy, resulting in complete and irreversible termination of such therapy. (See document titled "Medication Rules" for more details.) In all conscience, because of safety reasons, we cannot continue providing a therapy where the patient does not follow instructions.  Pharmacy of record:   Definition:  This is the pharmacy where your electronic prescriptions will be sent.   We do not endorse any particular pharmacy, however, we have experienced problems with [facility] not securing enough medication supply for the community.  We do not restrict you in your choice of pharmacy. However,   once we write for your prescriptions, we will NOT be re-sending more prescriptions to fix restricted supply problems created by your pharmacy, or your insurance.   The pharmacy listed in the electronic medical record should be the one where you want electronic prescriptions to be sent.  If you choose to change pharmacy, simply notify our nursing staff.  Recommendations:  Keep all of your pain medications in a safe place, under lock and key, even if you live alone. We will NOT replace lost, stolen, or damaged medication.  After you fill your prescription, take 1 week's worth of pills and put them away in a safe place. You should keep a separate, properly labeled bottle for this purpose. The remainder should be kept in the original bottle. Use this as your primary supply, until it runs out. Once it's gone, then you know that you have 1 week's worth of medicine, and it is time to come in for a prescription refill. If you do this correctly, it is unlikely that you will ever run out of medicine.  To make sure that the above recommendation works, it is very important that you make sure your medication refill appointments are scheduled at least 1 week before you run out of medicine. To do this in an effective manner, make sure that you do not leave the office without scheduling your next medication management appointment. Always ask the nursing staff to show you in your prescription , when your medication will be running out. Then arrange for the receptionist to get you a return appointment, at least 7 days before you run out of medicine. Do not wait until you have 1 or 2 pills left, to come in. This is very poor planning and  does not take into consideration that we may need to cancel appointments due to bad weather, sickness, or emergencies affecting our staff.  DO NOT ACCEPT A "Partial Fill": If for any reason your pharmacy does not have enough pills/tablets to completely fill or refill your prescription, do not allow for a "partial fill". The law allows the pharmacy to complete that prescription within 72 hours, without requiring a new prescription. If they do not fill the rest of your prescription within those 72 hours, you will need a separate prescription to fill the remaining amount, which we will NOT provide. If the reason for the partial fill is your insurance, you will need to talk to the pharmacist about payment alternatives for the remaining tablets, but again, DO NOT ACCEPT A PARTIAL FILL, unless you can trust your pharmacist to obtain the remainder of the pills within 72 hours.  Prescription refills and/or changes in medication(s):   Prescription refills, and/or changes in dose or medication, will be conducted only during scheduled medication management appointments. (Applies to both, written and electronic prescriptions.)  No refills on procedure days. No medication will be changed or started on procedure days. No changes, adjustments, and/or refills will be conducted on a procedure day. Doing so will interfere with the diagnostic portion of the procedure.  No phone refills. No medications will be "called into the pharmacy".  No Fax refills.  No weekend refills.  No Holliday refills.  No after hours refills.  Remember:  Business hours are:  Monday to Thursday 8:00 AM to 4:00 PM Provider's Schedule: Samia Kukla, MD - Appointments are:  Medication management: Monday and Wednesday 8:00 AM to 4:00 PM Procedure day: Tuesday and Thursday 7:30 AM to 4:00 PM Bilal Lateef, MD - Appointments are:    Medication management: Tuesday and Thursday 8:00 AM to 4:00 PM Procedure day: Monday and Wednesday  7:30 AM to 4:00 PM (Last update: 10/01/2019) ____________________________________________________________________________________________    

## 2020-02-11 ENCOUNTER — Encounter: Payer: Self-pay | Admitting: Surgery

## 2020-02-11 ENCOUNTER — Other Ambulatory Visit: Payer: Self-pay

## 2020-02-11 ENCOUNTER — Ambulatory Visit: Payer: Medicare Other | Admitting: Surgery

## 2020-02-11 ENCOUNTER — Telehealth: Payer: Self-pay

## 2020-02-11 VITALS — BP 159/74 | HR 61 | Temp 97.8°F | Ht 64.0 in | Wt 147.8 lb

## 2020-02-11 DIAGNOSIS — K449 Diaphragmatic hernia without obstruction or gangrene: Secondary | ICD-10-CM | POA: Diagnosis not present

## 2020-02-11 NOTE — Patient Instructions (Addendum)
Our surgery scheduler will call you within 24-48 hours to schedule your surgery. Please have your blue surgery sheet available when speaking with her. If you have not heart anything, please call our office.   Laparoscopic Nissen Fundoplication  Laparoscopic Nissen fundoplication is a surgery to relieve heartburn and other problems caused by gastric fluids flowing up into your esophagus. The esophagus is the part of the body that moves food from your mouth to your stomach. Normally, the muscle that sits between your stomach and your esophagus (lower esophageal sphincter, LES) keeps stomach fluids in your stomach. In some people, the LES does not work properly, and stomach fluids flow up into the esophagus. This can happen when part of the stomach bulges through the LES (hiatal hernia). The backward flow of stomach fluids can cause a type of severe and long-lasting heartburn that is called gastroesophageal reflux disease (GERD). You may need this surgery if other treatments for GERD have not helped. Tell a health care provider about:  Any allergies you have.  All medicines you are taking, including vitamins, herbs, eye drops, creams, and over-the-counter medicines.  Any problems you or family members have had with anesthetic medicines.  Any blood disorders you have.  Any surgeries you have had.  Any medical conditions you have.  Whether you are pregnant or may be pregnant. What are the risks? Generally, this is a safe procedure. However, problems may occur, including:  Infection.  Bleeding.  Damage to other structures or organs. This can include damage to the lung, causing a collapsed lung.  Trouble swallowing (dysphagia).  Blood clots. What happens before the procedure? Medicines  Ask your health care provider about: ? Changing or stopping your regular medicines. This is especially important if you are taking diabetes medicines or blood thinners. ? Taking medicines such as  aspirin and ibuprofen. These medicines can thin your blood. Do not take these medicines unless your health care provider tells you to take them. ? Taking over-the-counter medicines, vitamins, herbs, and supplements. Staying hydrated Follow instructions from your health care provider about hydration, which may include:  Up to 2 hours before the procedure - you may continue to drink clear liquids, such as water, clear fruit juice, black coffee, and plain tea. Eating and drinking restrictions Follow instructions from your health care provider about eating and drinking, which may include:  8 hours before the procedure - stop eating heavy meals or foods such as meat, fried foods, or fatty foods.  6 hours before the procedure - stop eating light meals or foods, such as toast or cereal.  6 hours before the procedure - stop drinking milk or drinks that contain milk.  2 hours before the procedure - stop drinking clear liquids. General instructions  Plan to have someone take you home from the hospital or clinic.  Ask your health care provider what steps will be taken to help prevent infection. These may include: ? Removing hair at the surgery site. ? Washing skin with a germ-killing soap. What happens during the procedure?  An IV will be inserted into one of your veins.  You will be given a medicine to make you fall asleep (general anesthetic).  The surgeon will make a small incision in your abdomen and insert a tube through the incision.  Your abdomen will be filled with a gas. This helps the surgeon see your organs better, and it makes more space to work.  The surgeon will insert a thin, lighted tube (laparoscope) through the  small incision. This allows your surgeon to see into your abdomen.  The surgeon will make several other small incisions in your abdomen to insert the other instruments that are needed during the procedure.  Another instrument (dilator) will be passed through your  mouth and down your esophagus into the upper part of your stomach. The dilator will prevent your LES from being closed too tightly during surgery.  The upper part of your stomach will be wrapped around the lower part of your esophagus and will be stitched into place. This will strengthen the lower esophageal sphincter and prevent reflux.  If you have a hiatal hernia, it will be repaired.  The gas will be released from your abdomen.  All instruments will be removed, and the incisions will be closed with stitches (sutures).  A bandage (dressing) will be placed on your skin over the incisions. The procedure may vary among health care providers and hospitals. What happens after the procedure?  Your blood pressure, heart rate, breathing rate, and blood oxygen level will be monitored until you leave the hospital or clinic.  You will be given pain medicine as needed.  Your IV will be kept in until you are able to drink fluids.  You will be encouraged to get up and walk around as soon as possible. Summary  Laparoscopic Nissen fundoplication is a surgery to relieve heartburn and other problems caused by gastric fluids flowing up into your esophagus.  You may need this surgery if other treatments for GERD have not helped.  Follow instructions from your health care provider about eating and drinking before the procedure.  Your surgeon will use a thin, lighted tube (laparoscope) that is inserted through a small incision, allowing the surgeon to see into your abdomen. This information is not intended to replace advice given to you by your health care provider. Make sure you discuss any questions you have with your health care provider. Document Revised: 05/04/2017 Document Reviewed: 03/28/2017 Elsevier Patient Education  [place].     Hiatal Hernia  A hiatal hernia occurs when part of the stomach slides above the muscle that separates the abdomen from the chest (diaphragm). A  person can be born with a hiatal hernia (congenital), or it may develop over time. In almost all cases of hiatal hernia, only the top part of the stomach pushes through the diaphragm. Many people have a hiatal hernia with no symptoms. The larger the hernia, the more likely it is that you will have symptoms. In some cases, a hiatal hernia allows stomach acid to flow back into the tube that carries food from your mouth to your stomach (esophagus). This may cause heartburn symptoms. Severe heartburn symptoms may mean that you have developed a condition called gastroesophageal reflux disease (GERD). What are the causes? This condition is caused by a weakness in the opening (hiatus) where the esophagus passes through the diaphragm to attach to the upper part of the stomach. A person may be born with a weakness in the hiatus, or a weakness can develop over time. What increases the risk? This condition is more likely to develop in:  Older people. Age is a major risk factor for a hiatal hernia, especially if you are over the age of 2.  Pregnant women.  People who are overweight.  People who have frequent constipation. What are the signs or symptoms? Symptoms of this condition usually develop in the form of GERD symptoms. Symptoms include:  Heartburn.  Belching.  Indigestion.  Trouble swallowing.  Coughing or wheezing.  Sore throat.  Hoarseness.  Chest pain.  Nausea and vomiting. How is this diagnosed? This condition may be diagnosed during testing for GERD. Tests that may be done include:  X-rays of your stomach or chest.  An upper gastrointestinal (GI) series. This is an X-ray exam of your GI tract that is taken after you swallow a chalky liquid that shows up clearly on the X-ray.  Endoscopy. This is a procedure to look into your stomach using a thin, flexible tube that has a tiny camera and light on the end of it. How is this treated? This condition may be treated by:  Dietary  and lifestyle changes to help reduce GERD symptoms.  Medicines. These may include: ? Over-the-counter antacids. ? Medicines that make your stomach empty more quickly. ? Medicines that block the production of stomach acid (H2 blockers). ? Stronger medicines to reduce stomach acid (proton pump inhibitors).  Surgery to repair the hernia, if other treatments are not helping. If you have no symptoms, you may not need treatment. Follow these instructions at home: Lifestyle and activity  Do not use any products that contain nicotine or tobacco, such as cigarettes and e-cigarettes. If you need help quitting, ask your health care provider.  Try to achieve and maintain a healthy body weight.  Avoid putting pressure on your abdomen. Anything that puts pressure on your abdomen increases the amount of acid that may be pushed up into your esophagus. ? Avoid bending over, especially after eating. ? Raise the head of your bed by putting blocks under the legs. This keeps your head and esophagus higher than your stomach. ? Do not wear tight clothing around your chest or stomach. ? Try not to strain when having a bowel movement, when urinating, or when lifting heavy objects. Eating and drinking  Avoid foods that can worsen GERD symptoms. These may include: ? Fatty foods, like fried foods. ? Citrus fruits, like oranges or lemon. ? Other foods and drinks that contain acid, like orange juice or tomatoes. ? Spicy food. ? Chocolate.  Eat frequent small meals instead of three large meals a day. This helps prevent your stomach from getting too full. ? Eat slowly. ? Do not lie down right after eating. ? Do not eat 1-2 hours before bed.  Do not drink beverages with caffeine. These include cola, coffee, cocoa, and tea.  Do not drink alcohol. General instructions  Take over-the-counter and prescription medicines only as told by your health care provider.  Keep all follow-up visits as told by your health  care provider. This is important. Contact a health care provider if:  Your symptoms are not controlled with medicines or lifestyle changes.  You are having trouble swallowing.  You have coughing or wheezing that will not go away. Get help right away if:  Your pain is getting worse.  Your pain spreads to your arms, neck, jaw, teeth, or back.  You have shortness of breath.  You sweat for no reason.  You feel sick to your stomach (nauseous) or you vomit.  You vomit blood.  You have bright red blood in your stools.  You have black, tarry stools. This information is not intended to replace advice given to you by your health care provider. Make sure you discuss any questions you have with your health care provider. Document Revised: 02/09/2017 Document Reviewed: 10/02/2016 Elsevier Patient Education  [place] After Nissen Fundoplication After  a Nissen fundoplication procedure, it is common to have some difficulty swallowing. The part of your body that moves food and liquid from your mouth to your stomach (esophagus) will be swollen and may feel tight. It will take several weeks or months for your esophagus and stomach to heal. By following a special eating plan, you can prevent problems such as pain, swelling or pressure in the abdomen (bloating), gas, nausea, or diarrhea. What are tips for following this plan? Cooking  Cook all foods until they are soft.  Remove skins and seeds from fruits and vegetables before eating.  Remove skin and gristle from meats before eating.  Grind or finely mince meats before eating.  Avoid over-cooking meat. Dry, tough meat is more difficult to swallow.  Avoid or use small amounts of oil when cooking.  Avoid or use small amounts of seasoning when cooking.  Toast bread before eating. This makes it easier to swallow.  Allow hot soups and drinks to cool before eating. Meal planning   Eat 6-8 small meals  throughout the day.  Right after the surgery, have a few meals that are only clear liquids. Clear liquids include: ? Water. ? Clear fruit juice (no pulp). ? Chicken, beef, or vegetable broth. ? Gelatin. ? Decaffeinated tea or coffee without milk. ? Ice pops or shaved ice.  Depending on your progress, you may move to a full liquid diet as told by your health care provider. This includes clear liquids and the following: ? Dairy and alternative milks (soy). ? Strained creamed soups. ? Ice cream or sherbet. ? Pudding. ? Nutritional supplement drinks. ? Yogurt.  A few days after surgery, you may be able to start eating a diet of soft foods. You may need to eat according to this plan for several weeks.  Avoid foods and drinks that contain caffeine and chocolate.  Do not drink carbonated drinks or alcohol.  Avoid foods and drinks that contain citrus or tomato.  Do not eat sweets or sweetened drinks at the beginning of a meal. Doing that may cause your stomach to empty faster than it should (dumping syndrome).  Avoid foods that cause gas, such as beans, peas, broccoli, or cabbage.  If dairy milk products cause diarrhea, avoid them or eat them in small amounts. Lifestyle   Always sit upright when eating or drinking.  Eat slowly. Take small bites and chew food well before swallowing.  Do not lie down after eating. Stay sitting up for 30 minutes or longer after each meal.  Sip fluids between meals.  Do not mix solid foods and liquids in the same mouthful.  Limit how much you drink at one time. With meals and snacks, have 4-8 oz (120-240 mL). This is equal to  cup-1 cup.  Drink enough fluid to keep your urine pale yellow.  Do not chew gum or drink fluids through a straw. Doing those things may cause you to swallow extra air. Recommended foods The items listed may not be a complete list. Talk with your dietitian about what dietary choices are best for you. Grains  Cooked  cereals. Dry cereals softened with liquid. Cooked pasta, rice, or other grains. Toasted bread. Bland crackers, such as soda or graham crackers. Vegetables  Any soft-cooked vegetables after skins and seeds are removed. Vegetable juice. Fruits  Any soft-cooked fruits after skins and seeds are removed. Fruit juice. Meats and other protein foods  Tender cuts of meat, poultry, or fish after bones, skin, and gristle are  removed. Poached, boiled, or scrambled eggs. Canned fish. Tofu. Creamy nut butters. Dairy  Milk. Yogurt. Cottage cheese. Mild cheeses. Beverages  Nutritional supplement drinks. Decaffeinated tea or coffee. Sports drinks. Fats and oils  Butter. Margarine. Mayonnaise. Vegetable oil. Smooth salad dressing. Sweets and desserts  Plain hard candy. Marshmallows. Pudding. Ice cream. Gelatin. Sherbet. Seasoning and other foods  Salt. Light seasonings. Mustard. Vinegar. Foods to avoid The items listed may not be a complete list. Talk with your dietitian about what dietary choices are best for you. Grains  High-fiber or bran cereal. Cereal with nuts, dried fruit, or coconut. Sweet breads, rolls, coffee cake, or donuts. Chewy or crusty breads. Popcorn. Vegetables  Tomato sauce. Tomato juice. Broccoli. Cauliflower. Cabbage. Brussels sprouts. Crunchy, raw vegetables. Fruits  Oranges. Grapefruit. Lemons. Limes. Citrus juices. Dried fruit. Crunchy, raw fruits. Meats and other protein foods  Beans, peas, and lentils. Tough or fatty meats. Fried meats, chicken, or fish. Fried eggs. Nuts and seeds. Crunchy nut butters. Dairy  Chocolate milk. Yogurt with chunks of fruit, nuts, seeds, or coconut. Strong cheeses. Beverages  Carbonated soft drinks. Alcohol. Cocoa. Hot drinks. Fats and oils  Bacon fat. Lard. Sweets and desserts  Chocolate. Candy with nuts, coconut, or seeds. Peppermint. Cookies. Cakes. Pie crust. Seasoning and other foods  Heavy seasonings. Chili sauce.  Ketchup. Barbecue sauce. Angie Fava. Horseradish. Summary  Following this eating plan after a Nissen fundoplication is an important part of healing after surgery.  After surgery, you will start with a clear liquid diet before you progress to full liquids and soft foods. You may need to eat soft foods for several weeks.  Avoid eating foods that cause irritation, gas, nausea, diarrhea, or swelling or pressure in the abdomen (bloating), and avoid foods that are difficult to swallow.  Talk with your diet and nutrition specialist (dietitian) about what dietary choices are best for you. This information is not intended to replace advice given to you by your health care provider. Make sure you discuss any questions you have with your health care provider. Document Revised: 02/09/2017 Document Reviewed: 09/11/2016 Elsevier Patient Education  [place].

## 2020-02-11 NOTE — H&P (View-Only) (Signed)
Outpatient Surgical Follow Up  02/11/2020  [name] is an 59 y.o. female.   Chief Complaint  Patient presents with  . Follow-up    hiatal hernia    HPI: [name] is a 59 year old well-known to me following for recalcitrant reflux associated to the hiatal hernia. He has had reflux symptoms for the last few years.  Reports significant heartburn worsening when laying down.  This has been recalcitrant and not responsive to PPI.  As part of the work-up she has had a pH probe showing and confirming significant reflux.  She also has no evidence of esophageal spasms.  SHe had a fresh  CT scan that I have personally reviewed showing no evidence of significant paraesophageal hernias.  There was a normal esophagus and normal stomach. Sge just had an EGD I have personally reviewed the images.  Evidence of GERD and small Hiatal hernia, no Barret's/ No strictures  SHe is able to perform more than 4 METS of activity without any shortness of breath or chest pain.   He has completed her cardiology evaluation and is optimized from their perspective.  It is okay to withhold anticoagulation for 48 hours She goes to the pain clinic as well takes morphine baseline  Past Medical History:  Diagnosis Date  . A-fib ([place]) 2021  . Abnormal EKG    HX OF INVERTED T WAVES ON EKG, PALPITATIONS, CHEST PAINS-CARDIAC WORK UP DID NOT SHOW ANY HEART DISEASE  . AC (acromioclavicular) joint bone spurs   . Acute postoperative pain 01/04/2017  . 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 Anemia    Iron Infusion-8 yrs ago  . Anxiety   . Asthma    INHALERS WITH FLARE -UPS  . Cephalalgia 08/18/2014  . Cervical disc disease 08/18/2014   Needs neck surgery.   . Chronic headaches   . DDD (degenerative disc disease)    CERVICAL AND LUMBAR-CHRONIC PAIN, RT HIP LABRAL TEAR  . Depression    PT STATES A LOT OF STRESS IN HER LIFE  . Dissociative disorder   . Dizziness 04/22/2013  . Duodenal ulcer with hemorrhage and perforation ([place])  04/27/2003  . GERD (gastroesophageal reflux disease)   . H/O arthrodesis 08/18/2014  . Headache(784.0)    AND NECK PAIN--STATES RECENT TEST SHOW CERVICAL DEGENERATION  . History of blood transfusion    s/p back surgery  . History of cardioversion   . History of cervical spinal surgery 01/04/2015  . History of kidney stones   . Hypertension   . Inverted T wave   . Narrowing of intervertebral disc space 08/18/2014   Currently on disability.   . Orthostatic hypotension 04/22/2013  . Pain    CHRONIC NECK AND BACK PAIN - LIMITED ROM NECK - S/P FUSIONS CERVICAL AND LUMBAR  . Pneumonia   . PONV (postoperative nausea and vomiting)    PT GIVES HX OF N&V AND FEVER WITH SURGERIES YEARS AGO--BUT NO PROBLEMS WITH MORE RECENT SURGERIES--STATES NOT MALIGNANT HYPERTHERMIA  . Postop Hyponatremia 05/14/2012  . Postoperative anemia due to acute blood loss 05/14/2012  . PTSD (post-traumatic stress disorder)   . Right foot drop   . 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   . Sleep apnea 2021    Past Surgical History:  Procedure Laterality Date  . ABDOMINAL HYSTERECTOMY    . afib  05/2019  . ANTERIOR CERVICAL DECOMP/DISCECTOMY FUSION N/A 10/30/2012   Procedure: ACDF C5-6, EXPLORATION AND HARDWARE REMOVAL C6-7;  Surgeon:  Melina Schools, MD;  Location: [place];  Service: Orthopedics;  Laterality: N/A;  . ANTERIOR FUSION CERVICAL SPINE  MAY 2012   AT [facility]  . BACK SURGERY  2009   LUMBAR FUSION WITH RODS   . BREAST BIOPSY Right 2008   benign.- bx/clip  . BREAST EXCISIONAL BIOPSY Left 1998   benign  . BREAST REDUCTION SURGERY Bilateral 06/2016  . CARDIOVERSION N/A 12/03/2019   Procedure: CARDIOVERSION;  Surgeon: Kate Sable, MD;  Location: [facility];  Service: Cardiovascular;  Laterality: N/A;  . CARPAL TUNNEL RELEASE  05-06-12   Right  . CHOLECYSTECTOMY    . COLONOSCOPY WITH PROPOFOL N/A 01/26/2017   Procedure: COLONOSCOPY WITH PROPOFOL;  Surgeon: Lucilla Lame,  MD;  Location: [place];  Service: Endoscopy;  Laterality: N/A;  . DIAGNOSTIC LAPAROSCOPIES - MULTIPLE FOR ENDOMETRIOSIS    . ESOPHAGEAL DILATION N/A 01/26/2017   Procedure: ESOPHAGEAL DILATION;  Surgeon: Lucilla Lame, MD;  Location: [place];  Service: Endoscopy;  Laterality: N/A;  . ESOPHAGEAL MANOMETRY N/A 05/30/2017   Procedure: ESOPHAGEAL MANOMETRY (EM);  Surgeon: Lucilla Lame, MD;  Location: [facility];  Service: Endoscopy;  Laterality: N/A;  . ESOPHAGOGASTRODUODENOSCOPY (EGD) WITH PROPOFOL N/A 01/26/2017   Procedure: ESOPHAGOGASTRODUODENOSCOPY (EGD) WITH PROPOFOL;  Surgeon: Lucilla Lame, MD;  Location: [place];  Service: Endoscopy;  Laterality: N/A;  . ESOPHAGOGASTRODUODENOSCOPY (EGD) WITH PROPOFOL N/A 01/30/2020   Procedure: ESOPHAGOGASTRODUODENOSCOPY (EGD) WITH PROPOFOL;  Surgeon: Lucilla Lame, MD;  Location: [place];  Service: Endoscopy;  Laterality: N/A;  sleep apnea COVID + 01-15-20  . HIP ARTHROSCOPY  09/20/2011   Procedure: ARTHROSCOPY HIP;  Surgeon: Gearlean Alf, MD;  [facility];  Service: Orthopedics;  Laterality: Right;  Right Hip Scope with Labral Debridement  . LEFT HEART CATH AND CORONARY ANGIOGRAPHY Left 06/10/2019   Procedure: LEFT HEART CATH AND CORONARY ANGIOGRAPHY;  Surgeon: Nelva Bush, MD;  Location: [place] [facility];  Service: Cardiovascular;  Laterality: Left;  . NASAL SEPTUM SURGERY  MARCH 2013   IN [place]  . [place] IMPEDANCE STUDY N/A 05/30/2017   Procedure: [place] IMPEDANCE STUDY;  Surgeon: Lucilla Lame, MD;  Location: [facility];  Service: Endoscopy;  Laterality: N/A;  . POLYPECTOMY N/A 01/26/2017   Procedure: POLYPECTOMY;  Surgeon: Lucilla Lame, MD;  Location: [place];  Service: Endoscopy;  Laterality: N/A;  . POSTERIOR CERVICAL FUSION/FORAMINOTOMY N/A 04/16/2013   Procedure: REMOVAL CERVICAL PLATES AND INTERBODY CAGE/POSTERIOR CERVICAL SPINAL FUSION C4 - C6/C5 CORPECTOMY/C4 - C6 FUSION  WITH ILIAC CREST BONE GRAFT;  Surgeon: Melina Schools, MD;  Location: [place];  Service: Orthopedics;  Laterality: N/A;  . RADIOFREQUENCY ABLATION NERVES    . REDUCTION MAMMAPLASTY Bilateral 05/2016  . RIGHT HIP ARTHROSCOPY FOR LABRAL TEAR  ABOUT 2010   2012 also  . SHOULDER ARTHROSCOPY  05-06-12   bone spur  . TONSILLECTOMY     AS A CHILD  . TOTAL HIP ARTHROPLASTY Right 05/13/2012   Procedure: TOTAL HIP ARTHROPLASTY ANTERIOR APPROACH;  Surgeon: Gearlean Alf, MD;  [facility];  Service: Orthopedics;  Laterality: Right;    Family History  Problem Relation Age of Onset  . Aneurysm Mother   . Aneurysm Maternal Grandmother   . Breast cancer Paternal Grandmother 79  . Bipolar disorder Sister   . Bipolar disorder Grandchild   . Anxiety disorder Grandchild   . Depression Grandchild     Social History:  reports that she has never smoked. She has never used smokeless tobacco. She reports  that she does not drink alcohol and does not use drugs.  Allergies:  Allergies  Allergen Reactions  . Amoxicillin Hives  . Chlorhexidine Gluconate Dermatitis and Hives  . Clindamycin Hives  . Codeine Hives  . Erythromycin Hives    "mycins" in general  . Penicillin G Hives    "cillins" in general  . Sulfa Antibiotics Nausea And Vomiting and Hives       . Levofloxacin Hives  . Shellfish Allergy Hives  . Decadron [Dexamethasone] Other (See Comments)    Hot flashes, insomnia, "manic" Hot flashes, insomnia, "manic"  . Mangifera Indica Hives    papaya  . Papaya Derivatives Hives  . Betadine [Povidone Iodine] Hives       . Clarithromycin Hives  . Other Hives     Mango   . Povidone-Iodine Hives  . Prednisone Anxiety    High blood pressure, flushed, mood changes, heart palpitations High blood pressure, flushed, mood changes, heart palpitations    Medications reviewed.    ROS Full ROS performed and is otherwise negative other than what is stated in HPI   BP (!) 159/74   Pulse 61    Temp 97.8 F (36.6 C) (Oral)   Ht 5\' 4"  (1.626 m)   Wt 147 lb 12.8 oz (67 kg)   SpO2 98%   BMI 25.37 kg/m   Physical Exam Vitals and nursing note reviewed. Exam conducted with a chaperone present.  Constitutional:      General: She is not in acute distress.    Appearance: Normal appearance. She is normal weight.  Eyes:     General: No scleral icterus.       Right eye: No discharge.        Left eye: No discharge.  Cardiovascular:     Rate and Rhythm: Normal rate and regular rhythm.     Heart sounds: No murmur heard.   Pulmonary:     Effort: Pulmonary effort is normal. No respiratory distress.     Breath sounds: Normal breath sounds. No stridor.  Abdominal:     General: Abdomen is flat. There is no distension.     Palpations: Abdomen is soft. There is no mass.     Tenderness: There is no abdominal tenderness. There is no guarding or rebound.     Hernia: No hernia is present.  Musculoskeletal:        General: Normal range of motion.     Cervical back: Normal range of motion and neck supple. No rigidity.  Skin:    General: Skin is warm and dry.     Capillary Refill: Capillary refill takes less than 2 seconds.  Neurological:     General: No focal deficit present.     Mental Status: She is alert and oriented to person, place, and time.  Psychiatric:        Mood and Affect: Mood normal.        Behavior: Behavior normal.        Thought Content: Thought content normal.        Judgment: Judgment normal.        Assessment/Plan:  59 year old female with recalcitrant reflux and a small hiatal hernia.  Discussed with the patient in detail about her disease process.  I definitely think that repair of hiatal hernia with fundoplication is indicated.  Initially I thought about doing a toupee fundoplication but interrogating the patient she seems to think that the dysphagia is more in the cervical level related to her prior  were from the spine.  There is no evidence of any esophageal  dysfunction nor evidence of a strictures on endoscopic or imaging findings.  Had a lengthy discussion with the patient about the options of 270 degree wrap versus 360 degree wrap.  I am afraid that if we do not do a 360 degree wrap the outcome is not going to be as good for her reflux perspective.  She wants me to go ahead and proceed with 360 degree wrap and hernia repair.  Procedure discussed with the patient in detail.  Risks, benefits and possible indications including but not limited to: Bleeding, infection, esophageal injuries, bowel injuries, dysphagia, infection, recurrence.  She understands and wished to proceed.     Greater than 50% of the 45 minutes  visit was spent in counseling/coordination of care   Caroleen Hamman, MD [place] Surgeon

## 2020-02-11 NOTE — Telephone Encounter (Signed)
Medical clearance for pt is in her chart by her cardiologist Dr. Kate Sable on 01/13/2020.

## 2020-02-11 NOTE — Progress Notes (Signed)
Outpatient Surgical Follow Up  02/11/2020  [name] is an 59 y.o. female.   Chief Complaint  Patient presents with  . Follow-up    hiatal hernia    HPI: [name] is a 59 year old well-known to me following for recalcitrant reflux associated to the hiatal hernia. He has had reflux symptoms for the last few years.  Reports significant heartburn worsening when laying down.  This has been recalcitrant and not responsive to PPI.  As part of the work-up she has had a pH probe showing and confirming significant reflux.  She also has no evidence of esophageal spasms.  SHe had a fresh  CT scan that I have personally reviewed showing no evidence of significant paraesophageal hernias.  There was a normal esophagus and normal stomach. Sge just had an EGD I have personally reviewed the images.  Evidence of GERD and small Hiatal hernia, no Barret's/ No strictures  SHe is able to perform more than 4 METS of activity without any shortness of breath or chest pain.   He has completed her cardiology evaluation and is optimized from their perspective.  It is okay to withhold anticoagulation for 48 hours She goes to the pain clinic as well takes morphine baseline  Past Medical History:  Diagnosis Date  . A-fib ([place]) 2021  . Abnormal EKG    HX OF INVERTED T WAVES ON EKG, PALPITATIONS, CHEST PAINS-CARDIAC WORK UP DID NOT SHOW ANY HEART DISEASE  . AC (acromioclavicular) joint bone spurs   . Acute postoperative pain 01/04/2017  . 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 Anemia    Iron Infusion-8 yrs ago  . Anxiety   . Asthma    INHALERS WITH FLARE -UPS  . Cephalalgia 08/18/2014  . Cervical disc disease 08/18/2014   Needs neck surgery.   . Chronic headaches   . DDD (degenerative disc disease)    CERVICAL AND LUMBAR-CHRONIC PAIN, RT HIP LABRAL TEAR  . Depression    PT STATES A LOT OF STRESS IN HER LIFE  . Dissociative disorder   . Dizziness 04/22/2013  . Duodenal ulcer with hemorrhage and perforation ([place])  04/27/2003  . GERD (gastroesophageal reflux disease)   . H/O arthrodesis 08/18/2014  . Headache(784.0)    AND NECK PAIN--STATES RECENT TEST SHOW CERVICAL DEGENERATION  . History of blood transfusion    s/p back surgery  . History of cardioversion   . History of cervical spinal surgery 01/04/2015  . History of kidney stones   . Hypertension   . Inverted T wave   . Narrowing of intervertebral disc space 08/18/2014   Currently on disability.   . Orthostatic hypotension 04/22/2013  . Pain    CHRONIC NECK AND BACK PAIN - LIMITED ROM NECK - S/P FUSIONS CERVICAL AND LUMBAR  . Pneumonia   . PONV (postoperative nausea and vomiting)    PT GIVES HX OF N&V AND FEVER WITH SURGERIES YEARS AGO--BUT NO PROBLEMS WITH MORE RECENT SURGERIES--STATES NOT MALIGNANT HYPERTHERMIA  . Postop Hyponatremia 05/14/2012  . Postoperative anemia due to acute blood loss 05/14/2012  . PTSD (post-traumatic stress disorder)   . Right foot drop   . 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   . Sleep apnea 2021    Past Surgical History:  Procedure Laterality Date  . ABDOMINAL HYSTERECTOMY    . afib  05/2019  . ANTERIOR CERVICAL DECOMP/DISCECTOMY FUSION N/A 10/30/2012   Procedure: ACDF C5-6, EXPLORATION AND HARDWARE REMOVAL C6-7;  Surgeon:  Melina Schools, MD;  Location: [place];  Service: Orthopedics;  Laterality: N/A;  . ANTERIOR FUSION CERVICAL SPINE  MAY 2012   AT [facility]  . BACK SURGERY  2009   LUMBAR FUSION WITH RODS   . BREAST BIOPSY Right 2008   benign.- bx/clip  . BREAST EXCISIONAL BIOPSY Left 1998   benign  . BREAST REDUCTION SURGERY Bilateral 06/2016  . CARDIOVERSION N/A 12/03/2019   Procedure: CARDIOVERSION;  Surgeon: Kate Sable, MD;  Location: [facility];  Service: Cardiovascular;  Laterality: N/A;  . CARPAL TUNNEL RELEASE  05-06-12   Right  . CHOLECYSTECTOMY    . COLONOSCOPY WITH PROPOFOL N/A 01/26/2017   Procedure: COLONOSCOPY WITH PROPOFOL;  Surgeon: Lucilla Lame,  MD;  Location: [place];  Service: Endoscopy;  Laterality: N/A;  . DIAGNOSTIC LAPAROSCOPIES - MULTIPLE FOR ENDOMETRIOSIS    . ESOPHAGEAL DILATION N/A 01/26/2017   Procedure: ESOPHAGEAL DILATION;  Surgeon: Lucilla Lame, MD;  Location: [place];  Service: Endoscopy;  Laterality: N/A;  . ESOPHAGEAL MANOMETRY N/A 05/30/2017   Procedure: ESOPHAGEAL MANOMETRY (EM);  Surgeon: Lucilla Lame, MD;  Location: [facility];  Service: Endoscopy;  Laterality: N/A;  . ESOPHAGOGASTRODUODENOSCOPY (EGD) WITH PROPOFOL N/A 01/26/2017   Procedure: ESOPHAGOGASTRODUODENOSCOPY (EGD) WITH PROPOFOL;  Surgeon: Lucilla Lame, MD;  Location: [place];  Service: Endoscopy;  Laterality: N/A;  . ESOPHAGOGASTRODUODENOSCOPY (EGD) WITH PROPOFOL N/A 01/30/2020   Procedure: ESOPHAGOGASTRODUODENOSCOPY (EGD) WITH PROPOFOL;  Surgeon: Lucilla Lame, MD;  Location: [place];  Service: Endoscopy;  Laterality: N/A;  sleep apnea COVID + 01-15-20  . HIP ARTHROSCOPY  09/20/2011   Procedure: ARTHROSCOPY HIP;  Surgeon: Gearlean Alf, MD;  [facility];  Service: Orthopedics;  Laterality: Right;  Right Hip Scope with Labral Debridement  . LEFT HEART CATH AND CORONARY ANGIOGRAPHY Left 06/10/2019   Procedure: LEFT HEART CATH AND CORONARY ANGIOGRAPHY;  Surgeon: Nelva Bush, MD;  Location: [place] [facility];  Service: Cardiovascular;  Laterality: Left;  . NASAL SEPTUM SURGERY  MARCH 2013   IN [place]  . [place] IMPEDANCE STUDY N/A 05/30/2017   Procedure: [place] IMPEDANCE STUDY;  Surgeon: Lucilla Lame, MD;  Location: [facility];  Service: Endoscopy;  Laterality: N/A;  . POLYPECTOMY N/A 01/26/2017   Procedure: POLYPECTOMY;  Surgeon: Lucilla Lame, MD;  Location: [place];  Service: Endoscopy;  Laterality: N/A;  . POSTERIOR CERVICAL FUSION/FORAMINOTOMY N/A 04/16/2013   Procedure: REMOVAL CERVICAL PLATES AND INTERBODY CAGE/POSTERIOR CERVICAL SPINAL FUSION C4 - C6/C5 CORPECTOMY/C4 - C6 FUSION  WITH ILIAC CREST BONE GRAFT;  Surgeon: Melina Schools, MD;  Location: [place];  Service: Orthopedics;  Laterality: N/A;  . RADIOFREQUENCY ABLATION NERVES    . REDUCTION MAMMAPLASTY Bilateral 05/2016  . RIGHT HIP ARTHROSCOPY FOR LABRAL TEAR  ABOUT 2010   2012 also  . SHOULDER ARTHROSCOPY  05-06-12   bone spur  . TONSILLECTOMY     AS A CHILD  . TOTAL HIP ARTHROPLASTY Right 05/13/2012   Procedure: TOTAL HIP ARTHROPLASTY ANTERIOR APPROACH;  Surgeon: Gearlean Alf, MD;  [facility];  Service: Orthopedics;  Laterality: Right;    Family History  Problem Relation Age of Onset  . Aneurysm Mother   . Aneurysm Maternal Grandmother   . Breast cancer Paternal Grandmother 54  . Bipolar disorder Sister   . Bipolar disorder Grandchild   . Anxiety disorder Grandchild   . Depression Grandchild     Social History:  reports that she has never smoked. She has never used smokeless tobacco. She reports  that she does not drink alcohol and does not use drugs.  Allergies:  Allergies  Allergen Reactions  . Amoxicillin Hives  . Chlorhexidine Gluconate Dermatitis and Hives  . Clindamycin Hives  . Codeine Hives  . Erythromycin Hives    "mycins" in general  . Penicillin G Hives    "cillins" in general  . Sulfa Antibiotics Nausea And Vomiting and Hives       . Levofloxacin Hives  . Shellfish Allergy Hives  . Decadron [Dexamethasone] Other (See Comments)    Hot flashes, insomnia, "manic" Hot flashes, insomnia, "manic"  . Mangifera Indica Hives    papaya  . Papaya Derivatives Hives  . Betadine [Povidone Iodine] Hives       . Clarithromycin Hives  . Other Hives     Mango   . Povidone-Iodine Hives  . Prednisone Anxiety    High blood pressure, flushed, mood changes, heart palpitations High blood pressure, flushed, mood changes, heart palpitations    Medications reviewed.    ROS Full ROS performed and is otherwise negative other than what is stated in HPI   BP (!) 159/74   Pulse 61    Temp 97.8 F (36.6 C) (Oral)   Ht 5\' 4"  (1.626 m)   Wt 147 lb 12.8 oz (67 kg)   SpO2 98%   BMI 25.37 kg/m   Physical Exam Vitals and nursing note reviewed. Exam conducted with a chaperone present.  Constitutional:      General: She is not in acute distress.    Appearance: Normal appearance. She is normal weight.  Eyes:     General: No scleral icterus.       Right eye: No discharge.        Left eye: No discharge.  Cardiovascular:     Rate and Rhythm: Normal rate and regular rhythm.     Heart sounds: No murmur heard.   Pulmonary:     Effort: Pulmonary effort is normal. No respiratory distress.     Breath sounds: Normal breath sounds. No stridor.  Abdominal:     General: Abdomen is flat. There is no distension.     Palpations: Abdomen is soft. There is no mass.     Tenderness: There is no abdominal tenderness. There is no guarding or rebound.     Hernia: No hernia is present.  Musculoskeletal:        General: Normal range of motion.     Cervical back: Normal range of motion and neck supple. No rigidity.  Skin:    General: Skin is warm and dry.     Capillary Refill: Capillary refill takes less than 2 seconds.  Neurological:     General: No focal deficit present.     Mental Status: She is alert and oriented to person, place, and time.  Psychiatric:        Mood and Affect: Mood normal.        Behavior: Behavior normal.        Thought Content: Thought content normal.        Judgment: Judgment normal.        Assessment/Plan:  59 year old female with recalcitrant reflux and a small hiatal hernia.  Discussed with the patient in detail about her disease process.  I definitely think that repair of hiatal hernia with fundoplication is indicated.  Initially I thought about doing a toupee fundoplication but interrogating the patient she seems to think that the dysphagia is more in the cervical level related to her prior  were from the spine.  There is no evidence of any esophageal  dysfunction nor evidence of a strictures on endoscopic or imaging findings.  Had a lengthy discussion with the patient about the options of 270 degree wrap versus 360 degree wrap.  I am afraid that if we do not do a 360 degree wrap the outcome is not going to be as good for her reflux perspective.  She wants me to go ahead and proceed with 360 degree wrap and hernia repair.  Procedure discussed with the patient in detail.  Risks, benefits and possible indications including but not limited to: Bleeding, infection, esophageal injuries, bowel injuries, dysphagia, infection, recurrence.  She understands and wished to proceed.     Greater than 50% of the 45 minutes  visit was spent in counseling/coordination of care   Caroleen Hamman, MD [place] Surgeon

## 2020-02-12 ENCOUNTER — Telehealth: Payer: Self-pay | Admitting: Surgery

## 2020-02-12 NOTE — Telephone Encounter (Signed)
Patient has been advised of Pre-Admission date/time, COVID Testing date and Surgery date.  Surgery Date: 02/26/20 Preadmission Testing Date: 02/19/20 (phone 1p-5p) Covid Testing Date: Patient will not need to do per pre-admissions as she was positive on 01/14/20.      Patient has been made aware to call [phone], between 1-3:00pm the day before surgery, to find out what time to arrive for surgery.

## 2020-02-19 ENCOUNTER — Encounter
Admission: RE | Admit: 2020-02-19 | Discharge: 2020-02-19 | Disposition: A | Discharge status: Home / Self Care | Payer: Medicare Other | Source: Ambulatory Visit | Attending: Surgery | Admitting: Surgery

## 2020-02-19 ENCOUNTER — Telehealth: Payer: Self-pay | Admitting: *Deleted

## 2020-02-19 HISTORY — DX: Foot drop, right foot: M21.371

## 2020-02-19 NOTE — Telephone Encounter (Signed)
Pharmacy, can you please comment on how long Eliquis can be held for upcoming procedure?  Thank you! 

## 2020-02-19 NOTE — Patient Instructions (Addendum)
Your procedure is scheduled on:02-26-20 THURSDAY Report to the Registration Desk on the 1st floor of the [facility]-Then proceed to the 2nd floor Surgery Desk in the [facility] To find out your arrival time, please call [phone] between 1PM - 3PM on:02-25-20 WEDNESDAY  REMEMBER: Instructions that are not followed completely may result in serious medical risk, up to and including death; or upon the discretion of your surgeon and anesthesiologist your surgery may need to be rescheduled.  Do not eat food after midnight the night before surgery.  No gum chewing, lozengers or hard candies.  You may however, drink CLEAR liquids up to 2 hours before you are scheduled to arrive for your surgery. Do not drink anything within 2 hours of your scheduled arrival time.  Clear liquids include: - water  - apple juice without pulp - gatorade (not RED, PURPLE, OR BLUE) - black coffee or tea (Do NOT add milk or creamers to the coffee or tea) Do NOT drink anything that is not on this list.  TAKE THESE MEDICATIONS THE MORNING OF SURGERY WITH A SIP OF WATER: -AMIODARONE (PACERONE) -MORPHINE  -PROTONIX (PANTOPRAZOLE)  Follow recommendations from Cardiologist, Pulmonologist or PCP regarding stopping Aspirin, Coumadin, Plavix, Eliquis, Pradaxa, or Pletal-INSTRUCTED BY SURGEONS OFFICE TO STOP ELIQUIS 2 DAYS PRIOR TO SURGERY -LAST DOSE ON 02-23-20 MONDAY  One week prior to surgery: Stop Anti-inflammatories (NSAIDS) such as Advil, Aleve, Ibuprofen, Motrin, Naproxen, Naprosyn and Aspirin based products such as Excedrin, Goodys Powder, BC Powder-OK TO TAKE TYLENOL IF NEEDED  Stop ANY OVER THE COUNTER supplements until after surgery.  No Alcohol for 24 hours before or after surgery.  No Smoking including e-cigarettes for 24 hours prior to surgery.  No chewable tobacco products for at least 6 hours prior to surgery.  No nicotine patches on the day of surgery.  Do not use any "recreational" drugs for  at least a week prior to your surgery.  Please be advised that the combination of cocaine and anesthesia may have negative outcomes, up to and including death. If you test positive for cocaine, your surgery will be cancelled.  On the morning of surgery brush your teeth with toothpaste and water, you may rinse your mouth with mouthwash if you wish. Do not swallow any toothpaste or mouthwash.  Do not wear jewelry, make-up, hairpins, clips or nail polish.  Do not wear lotions, powders, or perfumes.   Do not shave body from the neck down 48 hours prior to surgery just in case you cut yourself which could leave a site for infection.  Also, freshly shaved skin may become irritated if using the CHG soap.  Contact lenses, hearing aids and dentures may not be worn into surgery.  Do not bring valuables to the hospital. [facility] is not responsible for any missing/lost belongings or valuables.   Notify your doctor if there is any change in your medical condition (cold, fever, infection).  Wear comfortable clothing (specific to your surgery type) to the hospital.  Plan for stool softeners for home use; pain medications have a tendency to cause constipation. You can also help prevent constipation by eating foods high in fiber such as fruits and vegetables and drinking plenty of fluids as your diet allows.  After surgery, you can help prevent lung complications by doing breathing exercises.  Take deep breaths and cough every 1-2 hours. Your doctor may order a device called an Incentive Spirometer to help you take deep breaths. When coughing or sneezing, hold a  pillow firmly against your incision with both hands. This is called "splinting." Doing this helps protect your incision. It also decreases belly discomfort.  If you are being admitted to the hospital overnight, leave your suitcase in the car. After surgery it may be brought to your room.  If you are being discharged the day of surgery, you  will not be allowed to drive home. You will need a responsible adult (18 years or older) to drive you home and stay with you that night.   If you are taking public transportation, you will need to have a responsible adult (18 years or older) with you. Please confirm with your physician that it is acceptable to use public transportation.   Please call the [facility] at [phone] if you have any questions about these instructions.  Visitation Policy:  Patients undergoing a surgery or procedure may have one family member or support person with them as long as that person is not COVID-19 positive or experiencing its symptoms.  That person may remain in the waiting area during the procedure.  Inpatient Visitation Update:   In an effort to ensure the safety of our team members and our patients, we are implementing a change to our visitation policy:  Effective Monday, Aug. 9, at 7 a.m., inpatients will be allowed one support person.  o The support person may change daily.  o The support person must pass our screening, gel in and out, and wear a mask at all times, including in the patient's room.  o Patients must also wear a mask when staff or their support person are in the room.  o Masking is required regardless of vaccination status.  Systemwide, no visitors 17 or younger.

## 2020-02-19 NOTE — Telephone Encounter (Signed)
Patient with diagnosis of afib on Eliquis for anticoagulation.    Procedure: XI ROBOTIC ASSISTED LAPAROSCOPIC NISSEN FUNDOPLICATION  Date of procedure: 02/26/20  CHA2DS2-VASc Score = 2  This indicates a 2.2% annual risk of stroke. The patient's score is based upon: CHF History: No HTN History: Yes Diabetes History: No Stroke History: No Vascular Disease History: No Age Score: 0 Gender Score: 1    Pt underwent cardioversion on 12/03/19.  CrCl 16mL/min Platelet count 427K  Per office protocol, patient can hold Eliquis for 2 days prior to procedure as requested.

## 2020-02-19 NOTE — Telephone Encounter (Signed)
Request for pre-operative cardiac clearance Received: Today Karen Kitchens, NP  [facility] Request for pre-operative cardiac clearance:    1. What type of surgery is being performed?  XI ROBOTIC ASSISTED LAPAROSCOPIC NISSEN FUNDOPLICATION   2. When is this surgery scheduled?  02/26/2020    3. Are there any medications that need to be held prior to surgery?  Apixaban x 2 days (last dose 02/23/2020)   4. Practice name and name of physician performing surgery?  Performing surgeon: Dr. Bea Graff Pabon  Requesting clearance: Honor Loh, FNP-C     5. Anesthesia type (none, local, MAC, general)?  General   6. What is the office phone and fax number?   Phone: [phone]  Fax: [phone]   NOTE: Patient previously cleared back in 01/2020 for EGD. That procedure was under MAC. For this procedure she will be undergoing general anesthetic course. I wanted to ensure that she was still cleared to proceed at an acceptable risk from a cardiovascular perspective.   ATTENTION: Unable to create telephone message as per your standard workflow. Directed by [facility] providers to send requests for cardiac clearance to this pool for appropriate distribution to provider covering pre-operative clearances.   Honor Loh, MSN, APRN, FNP-C, CEN  [facility]  Peri-operative Services Nurse Practitioner  Phone: [phone]  02/19/20 4:27 PM

## 2020-02-20 ENCOUNTER — Other Ambulatory Visit
Admission: RE | Admit: 2020-02-20 | Discharge: 2020-02-20 | Disposition: A | Discharge status: Home / Self Care | Payer: Medicare Other | Source: Ambulatory Visit | Attending: Surgery | Admitting: Surgery

## 2020-02-20 ENCOUNTER — Other Ambulatory Visit: Payer: Self-pay

## 2020-02-20 ENCOUNTER — Encounter: Payer: Self-pay | Admitting: Surgery

## 2020-02-20 DIAGNOSIS — Z01812 Encounter for preprocedural laboratory examination: Secondary | ICD-10-CM | POA: Diagnosis not present

## 2020-02-20 LAB — PROTIME-INR
INR: 1.2 (ref 0.8–1.2)
Prothrombin Time: 15 seconds (ref 11.4–15.2)

## 2020-02-20 LAB — POTASSIUM: Potassium: 3.2 mmol/L — ABNORMAL LOW (ref 3.5–5.1)

## 2020-02-20 NOTE — Telephone Encounter (Signed)
   Primary Cardiologist: Kate Sable, MD  Chart reviewed as part of pre-operative protocol coverage. Patient was last seen by Dr. Garen Lah in 12/2019. Patient was contacted today for further pre-op evaluation and reported doing well since last visit. She did note 2 episodes of severe heart burn that occurred after eating hot sauce which she states she knows she cannot eat. No other episodes of chest discomfort. No exertional chest pain. Reassuring that she had normal coronaries on cardiac cath in 05/2019. No significant shortness of breath, orthopnea, PND, or edema. She has had some dizziness due to problems with her vertigo but no palpitations or syncope. She is able to complete >4.0 METS of activity. Given past medical history and time since last visit, based on ACC/AHA guidelines, [name] would be at acceptable risk for the planned procedure without further cardiovascular testing.   Per Pharmacy and office protocol, patient can hold Eliquis for 2 days prior to procedure. This should be restarted as soon as able postoperatively.   I will route this recommendation to the requesting party via Epic fax function and remove from pre-op pool.  Please call with questions.  Darreld Mclean, PA-C 02/20/2020, 1:46 PM

## 2020-02-20 NOTE — Progress Notes (Addendum)
[facility] Perioperative Services  Pre-Admission/Anesthesia Testing Clinical Review  Date: 02/20/20  Patient Demographics:  Name: [name] DOB:   03-26-1960 MRN:   [id number]  Planned Surgical Procedure(s):    Case: [id number] Date/Time: 02/26/20 4562   Procedure: XI ROBOTIC ASSISTED LAPAROSCOPIC NISSEN FUNDOPLICATION - with [name], RNFA to assist (N/A )   Anesthesia type: General   Pre-op diagnosis: GERD, hiatal hernia   Location: [facility] OR ROOM 06 / [place] [facility] FOR ANESTHESIA GROUP   Surgeons: Jules Husbands, MD    NOTE: Available PAT nursing documentation and vital signs have been reviewed. Clinical nursing staff has updated patient's PMH/PSHx, current medication list, and drug allergies/intolerances to ensure comprehensive history available to assist in medical decision making as it pertains to the aforementioned surgical procedure and anticipated anesthetic course.   Clinical Discussion:  [name] is a 59 y.o. female who is submitted for pre-surgical anesthesia review and clearance prior to her undergoing the above procedure. Patient has never been a smoker. Pertinent PMH includes: TWI on ECG, unstable angina, A. Fib, HTN, OSAH (does not use nocturnal PAP therapy), asthma, DOE, GERD (on daily PPI), anemia, OA cervical and lumbar DDD, dissociative disorder, anxiety (on BZO), depression, PTSD, chronic opioid use  Patient is followed by cardiology Garen Lah, MD). She was last seen in the cardiology clinic on 12/19/2019; notes reviewed.  Patient with significant cardiovascular history.     Patient was initially seen due to a history of frequent palpitations.  Event monitor study performed in 04/2019 revealed a predominantly underlying sinus rhythm with episodes of atrial fibrillation occurring 3% of the time; heart rate ranged from 68 to 155 bpm (average 112 bpm), with the longest episode lasting 5 hours and 39 minutes.     Patient underwent  diagnostic left heart catheterization and 05/2019 revealed no significant coronary artery disease, however there was mildly elevated filling pressures noted that were suggestive of diastolic heart failure.  Patient was started on diuretic furosemide every other day.     TTE was performed in 06/2019 that revealed a normal left ventricular systolic function; LVEF 56-38% (see full interpretation of cardiovascular testing below).   Patient underwent DCCV procedure on 12/03/2019 and was started on amiodarone afterwards.    At the time of her clinic visit, patient reports feeling better overall as compared to her precardioversion state.  She denied any chest pain, significant shortness of breath, PND, orthopnea, significant palpitations, peripheral edema, vertiginous symptoms, or presyncope/syncope.  Her main complaint in clinic were related to her reflux.  ECG revealed sinus rhythm at a rate of 62 bpm.  Hypertension modestly controlled at 138/98 on prescribed amiodarone. CHA2DS2-VASc Score = 3.  Patient on chronic anticoagulation using apixaban; compliant with therapy with no evidence of bleeding.  There were no changes made to patient's medication regimen.  Patient to follow-up with outpatient cardiology in 3 months.  Patient scheduled to undergo an elective GI procedure on 02/26/2020 with Dr. Caroleen Hamman.  Given patient's past medical history significant for cardiovascular issues, presurgical cardiac clearance was sought by the PAT team.  Of note, patient cleared for EGD back and 01/2020, however this procedure was done using propofol only.  For patient's upcoming procedure on 02/26/2020, she will undergo general anesthesia.  Cardiology input solicited to ensure that patient still medically appropriate for this procedure from a cardiovascular standpoint.  Per cardiology, "given past medical history and time since her last visit, based on ACC/AHA guidelines, patient would be at  an ACCEPTABLE risk for the  planned procedure without further cardiovascular testing". Again, this patient is on daily anticoagulation therapy. She has been instructed on recommendations for holding her apixaban for 2 days prior to her procedure. The patient has been instructed that her last dose of her anticoagulant will be on 02/23/2020.  She reports previous perioperative complications with anesthesia.  In the past, patient reports that she experienced (+) PONV and fever following procedures "years ago", however has had no problems with more recent surgeries.  Patient states that her reaction was not considered to be malignant hyperthermia. She underwent a general anesthetic course at [facility] in 01/2020 with no documented complications.   Vitals with BMI 02/11/2020 02/09/2020 01/30/2020  Height 5\' 4"  5\' 4"  -  Weight 147 lbs 13 oz 144 lbs -  BMI 33.00 76.22 -  Systolic 633 354 -  Diastolic 74 92 -  Pulse 61 65 63  Some encounter information is confidential and restricted. Go to Review Flowsheets activity to see all data.    Providers/Specialists:   NOTE: Primary physician provider listed below. Patient may have been seen by APP or partner within same practice.   PROVIDER ROLE LAST Suszanne Finch, MD General Surgery  02/11/2020  Virginia Crews, MD Primary Care Provider  01/13/2020  Kate Sable, MD Cardiology  12/19/2019   Allergies:  Amoxicillin, Chlorhexidine gluconate, Clindamycin, Codeine, Erythromycin, Penicillin g, Sulfa antibiotics, Levofloxacin, Shellfish allergy, Decadron [dexamethasone], Mangifera indica, Papaya derivatives, Betadine [povidone iodine], Clarithromycin, Other, Povidone-iodine, and Prednisone  Current Home Medications:   No current facility-administered medications for this encounter.   Marland Kitchen acetaminophen (TYLENOL) 500 MG tablet  . ALPRAZolam (XANAX) 1 MG tablet  . amiodarone (PACERONE) 200 MG tablet  . apixaban (ELIQUIS) 5 MG TABS tablet  .  cyclobenzaprine (FLEXERIL) 5 MG tablet  . linaclotide (LINZESS) 290 MCG CAPS capsule  . Menthol, Topical Analgesic, (BENGAY EX)  . montelukast (SINGULAIR) 10 MG tablet  . [START ON 04/21/2020] morphine (MSIR) 15 MG tablet  . ondansetron (ZOFRAN) 4 MG tablet  . oxymetazoline (AFRIN) 0.05 % nasal spray  . pantoprazole (PROTONIX) 40 MG tablet  . polyethylene glycol (MIRALAX / GLYCOLAX) 17 g packet  . valACYclovir (VALTREX) 1000 MG tablet  . [START ON 02/21/2020] morphine (MSIR) 15 MG tablet  . [START ON 03/22/2020] morphine (MSIR) 15 MG tablet   History:   Past Medical History:  Diagnosis Date  . A-fib ([place]) 2021  . Abnormal EKG    HX OF INVERTED T WAVES ON EKG, PALPITATIONS, CHEST PAINS-CARDIAC WORK UP DID NOT SHOW ANY HEART DISEASE  . AC (acromioclavicular) joint bone spurs   . Acute postoperative pain 01/04/2017  . 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 Anemia    Iron Infusion-8 yrs ago  . Anxiety   . Asthma   . Cephalalgia 08/18/2014  . Cervical disc disease 08/18/2014   Needs neck surgery.   . Chronic headaches   . Chronic, continuous use of opioids   . DDD (degenerative disc disease)    CERVICAL AND LUMBAR-CHRONIC PAIN, RT HIP LABRAL TEAR  . Depression    PT STATES A LOT OF STRESS IN HER LIFE  . Dissociative disorder   . Dizziness 04/22/2013  . Duodenal ulcer with hemorrhage and perforation ([place]) 04/27/2003  . Foot drop, right    FROM BACK SURGERY  . GERD (gastroesophageal reflux disease)   . H/O arthrodesis 08/18/2014  . Headache(784.0)    AND NECK PAIN--STATES RECENT TEST  SHOW CERVICAL DEGENERATION  . History of blood transfusion    s/p back surgery  . History of cardioversion   . History of cervical spinal surgery 01/04/2015  . History of kidney stones   . Hypertension   . Inverted T wave   . Narrowing of intervertebral disc space 08/18/2014   Currently on disability.   . Orthostatic hypotension 04/22/2013  . Pain    CHRONIC NECK AND BACK PAIN - LIMITED ROM NECK - S/P FUSIONS  CERVICAL AND LUMBAR  . Pneumonia   . PONV (postoperative nausea and vomiting)    PT GIVES HX OF N&V AND FEVER WITH SURGERIES YEARS AGO--BUT NO PROBLEMS WITH MORE RECENT SURGERIES--STATES NOT MALIGNANT HYPERTHERMIA  . Postop Hyponatremia 05/14/2012  . Postoperative anemia due to acute blood loss 05/14/2012  . PTSD (post-traumatic stress disorder)   . Right foot drop   . 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   . Sleep apnea 2021   does not have a cpap  . Therapeutic opioid-induced constipation ([facility]IC)    Past Surgical History:  Procedure Laterality Date  . ABDOMINAL HYSTERECTOMY    . afib  05/2019  . ANTERIOR CERVICAL DECOMP/DISCECTOMY FUSION N/A 10/30/2012   Procedure: ACDF C5-6, EXPLORATION AND HARDWARE REMOVAL C6-7;  Surgeon: Melina Schools, MD;  Location: [place];  Service: Orthopedics;  Laterality: N/A;  . ANTERIOR FUSION CERVICAL SPINE  MAY 2012   AT [facility]  . BACK SURGERY  2009   LUMBAR FUSION WITH RODS   . BREAST BIOPSY Right 2008   benign.- bx/clip  . BREAST EXCISIONAL BIOPSY Left 1998   benign  . BREAST REDUCTION SURGERY Bilateral 06/2016  . CARDIOVERSION N/A 12/03/2019   Procedure: CARDIOVERSION;  Surgeon: Kate Sable, MD;  Location: [facility] ORS;  Service: Cardiovascular;  Laterality: N/A;  . CARPAL TUNNEL RELEASE  05-06-12   Right  . CHOLECYSTECTOMY    . COLONOSCOPY WITH PROPOFOL N/A 01/26/2017   Procedure: COLONOSCOPY WITH PROPOFOL;  Surgeon: Lucilla Lame, MD;  Location: [place];  Service: Endoscopy;  Laterality: N/A;  . DIAGNOSTIC LAPAROSCOPIES - MULTIPLE FOR ENDOMETRIOSIS    . ESOPHAGEAL DILATION N/A 01/26/2017   Procedure: ESOPHAGEAL DILATION;  Surgeon: Lucilla Lame, MD;  Location: [place];  Service: Endoscopy;  Laterality: N/A;  . ESOPHAGEAL MANOMETRY N/A 05/30/2017   Procedure: ESOPHAGEAL MANOMETRY (EM);  Surgeon: Lucilla Lame, MD;  Location: [facility];  Service: Endoscopy;  Laterality: N/A;  .  ESOPHAGOGASTRODUODENOSCOPY (EGD) WITH PROPOFOL N/A 01/26/2017   Procedure: ESOPHAGOGASTRODUODENOSCOPY (EGD) WITH PROPOFOL;  Surgeon: Lucilla Lame, MD;  Location: [place];  Service: Endoscopy;  Laterality: N/A;  . ESOPHAGOGASTRODUODENOSCOPY (EGD) WITH PROPOFOL N/A 01/30/2020   Procedure: ESOPHAGOGASTRODUODENOSCOPY (EGD) WITH PROPOFOL;  Surgeon: Lucilla Lame, MD;  Location: [place];  Service: Endoscopy;  Laterality: N/A;  sleep apnea COVID + 01-15-20  . HIP ARTHROSCOPY  09/20/2011   Procedure: ARTHROSCOPY HIP;  Surgeon: Gearlean Alf, MD;  Location: [facility];  Service: Orthopedics;  Laterality: Right;  Right Hip Scope with Labral Debridement  . LEFT HEART CATH AND CORONARY ANGIOGRAPHY Left 06/10/2019   Procedure: LEFT HEART CATH AND CORONARY ANGIOGRAPHY;  Surgeon: Nelva Bush, MD;  Location: [place] CV LAB;  Service: Cardiovascular;  Laterality: Left;  . NASAL SEPTUM SURGERY  MARCH 2013   IN [place]  . [place] IMPEDANCE STUDY N/A 05/30/2017   Procedure: [place] IMPEDANCE STUDY;  Surgeon: Lucilla Lame, MD;  Location: [facility];  Service: Endoscopy;  Laterality: N/A;  . POLYPECTOMY N/A 01/26/2017   Procedure: POLYPECTOMY;  Surgeon: Lucilla Lame, MD;  Location: [place];  Service: Endoscopy;  Laterality: N/A;  . POSTERIOR CERVICAL FUSION/FORAMINOTOMY N/A 04/16/2013   Procedure: REMOVAL CERVICAL PLATES AND INTERBODY CAGE/POSTERIOR CERVICAL SPINAL FUSION C4 - C6/C5 CORPECTOMY/C4 - C6 FUSION WITH ILIAC CREST BONE GRAFT;  Surgeon: Melina Schools, MD;  Location: [place];  Service: Orthopedics;  Laterality: N/A;  . RADIOFREQUENCY ABLATION NERVES    . REDUCTION MAMMAPLASTY Bilateral 05/2016  . RIGHT HIP ARTHROSCOPY FOR LABRAL TEAR  ABOUT 2010   2012 also  . SHOULDER ARTHROSCOPY  05-06-12   bone spur  . TONSILLECTOMY     AS A CHILD  . TOTAL HIP ARTHROPLASTY Right 05/13/2012   Procedure: TOTAL HIP ARTHROPLASTY ANTERIOR APPROACH;  Surgeon: Gearlean Alf, MD;  Location:  [facility];  Service: Orthopedics;  Laterality: Right;   Family History  Problem Relation Age of Onset  . Aneurysm Mother   . Aneurysm Maternal Grandmother   . Breast cancer Paternal Grandmother 86  . Bipolar disorder Sister   . Bipolar disorder Grandchild   . Anxiety disorder Grandchild   . Depression Grandchild    Social History   Tobacco Use  . Smoking status: Never Smoker  . Smokeless tobacco: Never Used  Vaping Use  . Vaping Use: Never used  Substance Use Topics  . Alcohol use: No    Alcohol/week: 0.0 standard drinks  . Drug use: No    Pertinent Clinical Results:  LABS: Labs reviewed: Acceptable for surgery.  Hospital Outpatient Visit on 02/20/2020  Component Date Value Ref Range Status  . Potassium 02/20/2020 3.2* 3.5 - 5.1 mmol/L Final   Performed at [facility], [address] [place], [place] [address]  . Prothrombin Time 02/20/2020 15.0  11.4 - 15.2 seconds Final  . INR 02/20/2020 1.2  0.8 - 1.2 Final   Comment: (NOTE) INR goal varies based on device and disease states. Performed at [facility], [address], [place], [place] [address]   Component Date Value Ref Range Status  . Creatinine, Ser 01/14/2020 0.77  0.44 - 1.00 mg/dL Final  . GFR, Estimated 01/14/2020 >60  >60 mL/min Final   Comment: (NOTE) Calculated using the CKD-EPI Creatinine Equation (2021) Performed at [facility], [address], [place],  [address]   Component Date Value Ref Range Status  . Total Protein 01/13/2020 6.7  6.0 - 8.5 g/dL Final  . Albumin 01/13/2020 4.2  3.8 - 4.9 g/dL Final  . Bilirubin Total 01/13/2020 0.6  0.0 - 1.2 mg/dL Final  . Bilirubin, Direct 01/13/2020 0.18  0.00 - 0.40 mg/dL Final  . Alkaline Phosphatase 01/13/2020 111  44 - 121 IU/L Final                 **Please note reference interval change**  . AST 01/13/2020 15  0 - 40 IU/L Final  . ALT 01/13/2020 8  0 - 32 IU/L Final  . Cholesterol, Total 01/13/2020 183  100 -  199 mg/dL Final  . Triglycerides 01/13/2020 116  0 - 149 mg/dL Final  . HDL 01/13/2020 64  >39 mg/dL Final  . VLDL Cholesterol Cal 01/13/2020 20  5 - 40 mg/dL Final  . LDL Chol Calc (NIH) 01/13/2020 99  0 - 99 mg/dL Final  . Chol/HDL Ratio 01/13/2020 2.9  0.0 - 4.4 ratio Final   Comment:  T. Chol/HDL Ratio                                             Men  Women                               1/2 Avg.Risk  3.4    3.3                                   Avg.Risk  5.0    4.4                                2X Avg.Risk  9.6    7.1                                3X Avg.Risk 23.4   11.0   Component Date Value Ref Range Status  . Sodium 11/26/2019 137  135 - 145 mmol/L Final  . Potassium 11/26/2019 3.1* 3.5 - 5.1 mmol/L Final  . Chloride 11/26/2019 101  98 - 111 mmol/L Final  . CO2 11/26/2019 26  22 - 32 mmol/L Final  . Glucose, Bld 11/26/2019 102* 70 - 99 mg/dL Final   Glucose reference range applies only to samples taken after fasting for at least 8 hours.  . BUN 11/26/2019 10  6 - 20 mg/dL Final  . Creatinine, Ser 11/26/2019 0.74  0.44 - 1.00 mg/dL Final  . Calcium 11/26/2019 9.5  8.9 - 10.3 mg/dL Final  . GFR calc non Af Amer 11/26/2019 >60  >60 mL/min Final  . GFR calc Af Amer 11/26/2019 >60  >60 mL/min Final  . Anion gap 11/26/2019 10  5 - 15 Final   Performed at [facility], [address] [place], [place] [address]  . WBC 11/26/2019 6.4  4.0 - 10.5 K/uL Final  . RBC 11/26/2019 4.93  3.87 - 5.11 MIL/uL Final  . Hemoglobin 11/26/2019 13.3  12.0 - 15.0 g/dL Final  . HCT 11/26/2019 40.2  36.0 - 46.0 % Final  . MCV 11/26/2019 81.5  80.0 - 100.0 fL Final  . MCH 11/26/2019 27.0  26.0 - 34.0 pg Final  . MCHC 11/26/2019 33.1  30.0 - 36.0 g/dL Final  . RDW 11/26/2019 13.3  11.5 - 15.5 % Final  . Platelets 11/26/2019 427* 150 - 400 K/uL Final  . nRBC 11/26/2019 0.0  0.0 - 0.2 % Final   Performed at [facility], [address]  [place], [place] [address]     ECG: Date: 12/19/2019 Time ECG obtained: 1057 AM Rate: 62 bpm Rhythm: normal sinus Axis (leads I and aVF): Normal Intervals: PR 144 ms. QRS 90 ms. QTc 462 ms. ST segment and T wave changes: Anterolateral ST and T wave abnormalities  Comparison: Previous tracing on 12/03/2019 showed atrial flutter with 4: 1 AV block; and anterolateral nonspecific T wave abnormality    IMAGING / PROCEDURES: ECHOCARDIOGRAM done on 07/09/2019 1. Left ventricular ejection fraction, by estimation, is 55 to 60%. The left ventricle has normal function. The left ventricle has noregional  wall motion abnormalities. Left ventricular diastolic parameters are indeterminate.  2. Right ventricular systolic function is normal. The right ventricular size is normal. There is normal pulmonary artery systolic pressure.  3. Left atrial size was mildly dilated.  4. The mitral valve is normal in structure. Mild mitral valve regurgitation.  5. The aortic valve is tricuspid. Aortic valve regurgitation is not visualized.  6. The inferior vena cava is normal in size with greater than 50% respiratory variability, suggesting right atrial pressure of 3 mmHg  LEFT HEART CATHETERIZATION AND CORONARY ANGIOGRAPHY done on 06/10/2019 1. No no angiographically significant CAD 2. Normal left ventricular contraction with mildly elevated filling pressure suggestive of diastolic heart failure  LONG TERM HOLTER MONITOR STUDY done on 05/02/2019 1. Patient had a min HR of 42 bpm, max HR of 182 bpm, and avg HR of 54 bpm. 2. Predominant underlying rhythm was sinus rhythm.  3. Atrial fibrillation occurred (3% burden), ranging from 68-155 bpm (avg of 112 bpm), the longest lasting 5 hours 39 mins with an avg rate of 109 bpm.  4. Supraventricular tachycardia and atrial fibrillation were detected within +/- 45 seconds of symptomatic patient event(s).  5. Isolated SVEs were occasional (1.7%, 16747), SVE Couplets were rare  (<1.0%, 531).   Impression and Plan:  [name] has been referred for pre-anesthesia review and clearance prior to her undergoing the planned anesthetic and procedural courses. Available labs, pertinent testing, and imaging results were personally reviewed by me. This patient has been appropriately cleared by cardiology with an overall ACCEPTABLE risk stratification.   Based on clinical review performed today (02/20/20), barring any significant acute changes in the patient's overall condition, it is anticipated that she will be able to proceed with the planned surgical intervention. Any acute changes in clinical condition may necessitate her procedure being postponed and/or cancelled. Pre-surgical instructions were reviewed with the patient during her PAT appointment and questions were fielded by PAT clinical staff.  Honor Loh, MSN, APRN, FNP-C, CEN [facility]  Peri-operative Services Nurse Practitioner Phone: [phone] 02/20/20 1:41 PM  NOTE: This note has been prepared using Dragon dictation software. Despite my best ability to proofread, there is always the potential that unintentional transcriptional errors may still occur from this process.

## 2020-02-20 NOTE — Telephone Encounter (Signed)
Follow up: ° ° °Patient returning call back. Please call patient. °

## 2020-02-20 NOTE — Telephone Encounter (Signed)
Left message to call back and ask to speak with pre-op team.  Darreld Mclean, PA-C 02/20/2020 9:12 AM Pager: [phone]

## 2020-02-24 ENCOUNTER — Other Ambulatory Visit: Payer: Medicare Other

## 2020-02-26 ENCOUNTER — Encounter
Admission: RE | Disposition: A | Discharge status: Home / Self Care | Payer: Self-pay | Source: Home / Self Care | Attending: Surgery

## 2020-02-26 ENCOUNTER — Other Ambulatory Visit: Payer: Self-pay

## 2020-02-26 ENCOUNTER — Inpatient Hospital Stay: Payer: Medicare Other | Admitting: Urgent Care

## 2020-02-26 ENCOUNTER — Encounter: Payer: Self-pay | Admitting: Surgery

## 2020-02-26 ENCOUNTER — Inpatient Hospital Stay
Admission: RE | Admit: 2020-02-26 | Discharge: 2020-02-28 | DRG: 328 | Disposition: A | Discharge status: Home / Self Care | Payer: Medicare Other | Attending: Surgery | Admitting: Surgery

## 2020-02-26 DIAGNOSIS — Z90710 Acquired absence of both cervix and uterus: Secondary | ICD-10-CM

## 2020-02-26 DIAGNOSIS — Z8616 Personal history of COVID-19: Secondary | ICD-10-CM | POA: Diagnosis not present

## 2020-02-26 DIAGNOSIS — Z79899 Other long term (current) drug therapy: Secondary | ICD-10-CM | POA: Diagnosis not present

## 2020-02-26 DIAGNOSIS — G8929 Other chronic pain: Secondary | ICD-10-CM | POA: Diagnosis not present

## 2020-02-26 DIAGNOSIS — Z881 Allergy status to other antibiotic agents status: Secondary | ICD-10-CM

## 2020-02-26 DIAGNOSIS — Z880 Allergy status to penicillin: Secondary | ICD-10-CM

## 2020-02-26 DIAGNOSIS — Z87442 Personal history of urinary calculi: Secondary | ICD-10-CM

## 2020-02-26 DIAGNOSIS — Z79891 Long term (current) use of opiate analgesic: Secondary | ICD-10-CM

## 2020-02-26 DIAGNOSIS — I4891 Unspecified atrial fibrillation: Secondary | ICD-10-CM | POA: Diagnosis not present

## 2020-02-26 DIAGNOSIS — R1310 Dysphagia, unspecified: Secondary | ICD-10-CM | POA: Diagnosis present

## 2020-02-26 DIAGNOSIS — R079 Chest pain, unspecified: Secondary | ICD-10-CM | POA: Diagnosis not present

## 2020-02-26 DIAGNOSIS — Z8719 Personal history of other diseases of the digestive system: Secondary | ICD-10-CM

## 2020-02-26 DIAGNOSIS — Z888 Allergy status to other drugs, medicaments and biological substances status: Secondary | ICD-10-CM

## 2020-02-26 DIAGNOSIS — K219 Gastro-esophageal reflux disease without esophagitis: Secondary | ICD-10-CM | POA: Diagnosis present

## 2020-02-26 DIAGNOSIS — Z981 Arthrodesis status: Secondary | ICD-10-CM

## 2020-02-26 DIAGNOSIS — F419 Anxiety disorder, unspecified: Secondary | ICD-10-CM | POA: Diagnosis present

## 2020-02-26 DIAGNOSIS — K449 Diaphragmatic hernia without obstruction or gangrene: Principal | ICD-10-CM | POA: Diagnosis present

## 2020-02-26 DIAGNOSIS — Z7901 Long term (current) use of anticoagulants: Secondary | ICD-10-CM

## 2020-02-26 DIAGNOSIS — Z885 Allergy status to narcotic agent status: Secondary | ICD-10-CM

## 2020-02-26 DIAGNOSIS — G4733 Obstructive sleep apnea (adult) (pediatric): Secondary | ICD-10-CM | POA: Diagnosis not present

## 2020-02-26 DIAGNOSIS — Z882 Allergy status to sulfonamides status: Secondary | ICD-10-CM

## 2020-02-26 DIAGNOSIS — Z96641 Presence of right artificial hip joint: Secondary | ICD-10-CM | POA: Diagnosis present

## 2020-02-26 DIAGNOSIS — Z803 Family history of malignant neoplasm of breast: Secondary | ICD-10-CM

## 2020-02-26 DIAGNOSIS — J45909 Unspecified asthma, uncomplicated: Secondary | ICD-10-CM | POA: Diagnosis present

## 2020-02-26 DIAGNOSIS — I10 Essential (primary) hypertension: Secondary | ICD-10-CM | POA: Diagnosis not present

## 2020-02-26 DIAGNOSIS — R140 Abdominal distension (gaseous): Secondary | ICD-10-CM | POA: Diagnosis not present

## 2020-02-26 HISTORY — DX: Opioid use, unspecified, uncomplicated: F11.90

## 2020-02-26 HISTORY — DX: Drug induced constipation: K59.03

## 2020-02-26 HISTORY — DX: Adverse effect of other opioids, initial encounter: T40.2X5A

## 2020-02-26 LAB — HIV ANTIBODY (ROUTINE TESTING W REFLEX): HIV Screen 4th Generation wRfx: NONREACTIVE

## 2020-02-26 SURGERY — FUNDOPLICATION, NISSEN, ROBOT-ASSISTED, LAPAROSCOPIC
Anesthesia: General | Site: Abdomen

## 2020-02-26 MED ORDER — ORAL CARE MOUTH RINSE: 15.0000 mL | Freq: Once | OROMUCOSAL | Status: DC

## 2020-02-26 MED ORDER — CELECOXIB 200 MG PO CAPS
ORAL_CAPSULE | ORAL | Status: AC
  Administered 2020-02-26: 08:00:00 200 mg via ORAL
  Filled 2020-02-26: qty 1

## 2020-02-26 MED ORDER — CEFAZOLIN SODIUM-DEXTROSE 2-4 GM/100ML-% IV SOLN
2.0000 g | Freq: Three times a day (TID) | INTRAVENOUS | Status: AC
  Administered 2020-02-26 – 2020-02-27 (×2): 2 g via INTRAVENOUS
  Filled 2020-02-26 (×3): qty 100

## 2020-02-26 MED ORDER — PROPOFOL 500 MG/50ML IV EMUL
INTRAVENOUS | Status: AC
  Filled 2020-02-26: qty 50

## 2020-02-26 MED ORDER — ROCURONIUM BROMIDE 10 MG/ML (PF) SYRINGE
PREFILLED_SYRINGE | INTRAVENOUS | Status: AC
  Filled 2020-02-26: qty 10

## 2020-02-26 MED ORDER — PROPOFOL 10 MG/ML IV BOLUS
INTRAVENOUS | Status: AC
  Filled 2020-02-26: qty 20

## 2020-02-26 MED ORDER — ENOXAPARIN SODIUM 40 MG/0.4ML ~~LOC~~ SOLN
40.0000 mg | SUBCUTANEOUS | Status: DC
  Administered 2020-02-27 – 2020-02-28 (×2): 40 mg via SUBCUTANEOUS
  Filled 2020-02-26 (×2): qty 0.4

## 2020-02-26 MED ORDER — ACETAMINOPHEN 500 MG PO TABS
1000.0000 mg | ORAL_TABLET | Freq: Four times a day (QID) | ORAL | Status: DC
  Administered 2020-02-26 – 2020-02-28 (×6): 1000 mg via ORAL
  Filled 2020-02-26 (×8): qty 2

## 2020-02-26 MED ORDER — SUCCINYLCHOLINE CHLORIDE 200 MG/10ML IV SOSY
PREFILLED_SYRINGE | INTRAVENOUS | Status: AC
  Filled 2020-02-26: qty 10

## 2020-02-26 MED ORDER — PREGABALIN 50 MG PO CAPS
100.0000 mg | ORAL_CAPSULE | Freq: Three times a day (TID) | ORAL | Status: DC
  Administered 2020-02-26 – 2020-02-28 (×6): 100 mg via ORAL
  Filled 2020-02-26 (×6): qty 2

## 2020-02-26 MED ORDER — ONDANSETRON 4 MG PO TBDP: 4.0000 mg | ORAL_TABLET | Freq: Four times a day (QID) | ORAL | Status: DC | PRN

## 2020-02-26 MED ORDER — EPHEDRINE SULFATE 50 MG/ML IJ SOLN
INTRAMUSCULAR | Status: DC | PRN
  Administered 2020-02-26 (×5): 10 mg via INTRAVENOUS

## 2020-02-26 MED ORDER — MORPHINE SULFATE (PF) 2 MG/ML IV SOLN
2.0000 mg | INTRAVENOUS | Status: DC | PRN
Start: 2020-02-26 — End: 2020-02-28

## 2020-02-26 MED ORDER — MIDAZOLAM HCL 2 MG/2ML IJ SOLN
INTRAMUSCULAR | Status: DC | PRN
  Administered 2020-02-26: 2 mg via INTRAVENOUS

## 2020-02-26 MED ORDER — DIPHENHYDRAMINE HCL 50 MG/ML IJ SOLN: 12.5000 mg | Freq: Four times a day (QID) | INTRAMUSCULAR | Status: DC | PRN

## 2020-02-26 MED ORDER — ACETAMINOPHEN 500 MG PO TABS: 1000.0000 mg | ORAL_TABLET | ORAL | Status: AC

## 2020-02-26 MED ORDER — ONDANSETRON HCL 4 MG/2ML IJ SOLN
INTRAMUSCULAR | Status: AC
  Filled 2020-02-26: qty 2

## 2020-02-26 MED ORDER — MORPHINE SULFATE 15 MG PO TABS
15.0000 mg | ORAL_TABLET | Freq: Four times a day (QID) | ORAL | Status: DC | PRN
  Administered 2020-02-26 – 2020-02-28 (×6): 15 mg via ORAL
  Filled 2020-02-26 (×7): qty 1

## 2020-02-26 MED ORDER — ONDANSETRON HCL 4 MG/2ML IJ SOLN: 4.0000 mg | Freq: Once | INTRAMUSCULAR | Status: DC | PRN

## 2020-02-26 MED ORDER — FENTANYL CITRATE (PF) 100 MCG/2ML IJ SOLN
INTRAMUSCULAR | Status: AC
  Filled 2020-02-26: qty 2

## 2020-02-26 MED ORDER — MONTELUKAST SODIUM 10 MG PO TABS
10.0000 mg | ORAL_TABLET | Freq: Every day | ORAL | Status: DC
  Administered 2020-02-26 – 2020-02-28 (×3): 10 mg via ORAL
  Filled 2020-02-26 (×3): qty 1

## 2020-02-26 MED ORDER — LACTATED RINGERS IV SOLN: INTRAVENOUS | Status: DC

## 2020-02-26 MED ORDER — MIDAZOLAM HCL 2 MG/2ML IJ SOLN
INTRAMUSCULAR | Status: AC
  Filled 2020-02-26: qty 2

## 2020-02-26 MED ORDER — GABAPENTIN 300 MG PO CAPS: 300.0000 mg | ORAL_CAPSULE | ORAL | Status: AC

## 2020-02-26 MED ORDER — HYDROMORPHONE HCL 1 MG/ML IJ SOLN
INTRAMUSCULAR | Status: AC
  Filled 2020-02-26: qty 1

## 2020-02-26 MED ORDER — AMIODARONE HCL 200 MG PO TABS
200.0000 mg | ORAL_TABLET | Freq: Every day | ORAL | Status: DC
  Administered 2020-02-26 – 2020-02-28 (×3): 200 mg via ORAL
  Filled 2020-02-26 (×3): qty 1

## 2020-02-26 MED ORDER — SUCCINYLCHOLINE CHLORIDE 20 MG/ML IJ SOLN
INTRAMUSCULAR | Status: DC | PRN
  Administered 2020-02-26: 40 mg via INTRAVENOUS

## 2020-02-26 MED ORDER — LIDOCAINE HCL (PF) 2 % IJ SOLN
INTRAMUSCULAR | Status: AC
  Filled 2020-02-26: qty 5

## 2020-02-26 MED ORDER — PROPOFOL 10 MG/ML IV BOLUS
INTRAVENOUS | Status: DC | PRN
  Administered 2020-02-26: 150 mg via INTRAVENOUS

## 2020-02-26 MED ORDER — ONDANSETRON HCL 4 MG/2ML IJ SOLN: 4.0000 mg | Freq: Four times a day (QID) | INTRAMUSCULAR | Status: DC | PRN

## 2020-02-26 MED ORDER — ACETAMINOPHEN 500 MG PO TABS
ORAL_TABLET | ORAL | Status: AC
  Administered 2020-02-26: 08:00:00 1000 mg via ORAL
  Filled 2020-02-26: qty 2

## 2020-02-26 MED ORDER — DIPHENHYDRAMINE HCL 50 MG/ML IJ SOLN
INTRAMUSCULAR | Status: DC | PRN
  Administered 2020-02-26: 25 mg via INTRAVENOUS

## 2020-02-26 MED ORDER — CYCLOBENZAPRINE HCL 10 MG PO TABS: 5.0000 mg | ORAL_TABLET | Freq: Three times a day (TID) | ORAL | Status: DC | PRN

## 2020-02-26 MED ORDER — OXYCODONE HCL 5 MG PO TABS
5.0000 mg | ORAL_TABLET | ORAL | Status: DC | PRN
Start: 2020-02-26 — End: 2020-02-28
  Administered 2020-02-26 – 2020-02-28 (×4): 10 mg via ORAL
  Filled 2020-02-26 (×4): qty 2

## 2020-02-26 MED ORDER — GLYCOPYRROLATE 0.2 MG/ML IJ SOLN
INTRAMUSCULAR | Status: DC | PRN
  Administered 2020-02-26 (×2): .2 mg via INTRAVENOUS

## 2020-02-26 MED ORDER — PHENYLEPHRINE HCL (PRESSORS) 10 MG/ML IV SOLN
INTRAVENOUS | Status: DC | PRN
  Administered 2020-02-26 (×2): 100 ug via INTRAVENOUS
  Administered 2020-02-26: 200 ug via INTRAVENOUS
  Administered 2020-02-26: 100 ug via INTRAVENOUS

## 2020-02-26 MED ORDER — METOPROLOL TARTRATE 5 MG/5ML IV SOLN
5.0000 mg | Freq: Four times a day (QID) | INTRAVENOUS | Status: DC | PRN
Start: 2020-02-26 — End: 2020-02-28

## 2020-02-26 MED ORDER — BUPIVACAINE-EPINEPHRINE 0.25% -1:200000 IJ SOLN
INTRAMUSCULAR | Status: DC | PRN
  Administered 2020-02-26: 30 mL

## 2020-02-26 MED ORDER — ROCURONIUM BROMIDE 100 MG/10ML IV SOLN
INTRAVENOUS | Status: DC | PRN
  Administered 2020-02-26: 20 mg via INTRAVENOUS
  Administered 2020-02-26: 60 mg via INTRAVENOUS

## 2020-02-26 MED ORDER — PROPOFOL 500 MG/50ML IV EMUL
INTRAVENOUS | Status: DC | PRN
  Administered 2020-02-26: 150 ug/kg/min via INTRAVENOUS

## 2020-02-26 MED ORDER — LIDOCAINE HCL (CARDIAC) PF 100 MG/5ML IV SOSY
PREFILLED_SYRINGE | INTRAVENOUS | Status: DC | PRN
  Administered 2020-02-26: 80 mg via INTRAVENOUS

## 2020-02-26 MED ORDER — SODIUM CHLORIDE 0.9 % IV SOLN: INTRAVENOUS | Status: DC

## 2020-02-26 MED ORDER — KETOROLAC TROMETHAMINE 30 MG/ML IJ SOLN
30.0000 mg | Freq: Four times a day (QID) | INTRAMUSCULAR | Status: DC
  Administered 2020-02-26 – 2020-02-28 (×6): 30 mg via INTRAVENOUS
  Filled 2020-02-26 (×8): qty 1

## 2020-02-26 MED ORDER — CEFAZOLIN SODIUM-DEXTROSE 2-4 GM/100ML-% IV SOLN
2.0000 g | INTRAVENOUS | Status: AC
  Administered 2020-02-26: 10:00:00 2 g via INTRAVENOUS

## 2020-02-26 MED ORDER — CEFAZOLIN SODIUM-DEXTROSE 2-4 GM/100ML-% IV SOLN
INTRAVENOUS | Status: AC
  Filled 2020-02-26: qty 100

## 2020-02-26 MED ORDER — BUPIVACAINE LIPOSOME 1.3 % IJ SUSP
INTRAMUSCULAR | Status: DC | PRN
  Administered 2020-02-26: 20 mL

## 2020-02-26 MED ORDER — SUGAMMADEX SODIUM 200 MG/2ML IV SOLN
INTRAVENOUS | Status: DC | PRN
  Administered 2020-02-26: 200 mg via INTRAVENOUS

## 2020-02-26 MED ORDER — SCOPOLAMINE 1 MG/3DAYS TD PT72
MEDICATED_PATCH | TRANSDERMAL | Status: AC
  Administered 2020-02-26: 09:00:00 1.5 mg
  Filled 2020-02-26: qty 1

## 2020-02-26 MED ORDER — ALPRAZOLAM 0.5 MG PO TABS
1.0000 mg | ORAL_TABLET | Freq: Every day | ORAL | Status: DC
  Administered 2020-02-26: 22:00:00 1 mg via ORAL
  Filled 2020-02-26: qty 2

## 2020-02-26 MED ORDER — LINACLOTIDE 290 MCG PO CAPS
290.0000 ug | ORAL_CAPSULE | Freq: Every day | ORAL | Status: DC
  Administered 2020-02-27 – 2020-02-28 (×2): 290 ug via ORAL
  Filled 2020-02-26 (×2): qty 1

## 2020-02-26 MED ORDER — ONDANSETRON HCL 4 MG/2ML IJ SOLN
INTRAMUSCULAR | Status: DC | PRN
  Administered 2020-02-26: 4 mg via INTRAVENOUS

## 2020-02-26 MED ORDER — HYDROMORPHONE HCL 1 MG/ML IJ SOLN
0.5000 mg | INTRAMUSCULAR | Status: DC | PRN
  Administered 2020-02-26 (×2): 0.5 mg via INTRAVENOUS

## 2020-02-26 MED ORDER — FENTANYL CITRATE (PF) 100 MCG/2ML IJ SOLN
25.0000 ug | INTRAMUSCULAR | Status: DC | PRN
  Administered 2020-02-26 (×3): 50 ug via INTRAVENOUS

## 2020-02-26 MED ORDER — SALINE SPRAY 0.65 % NA SOLN
1.0000 | NASAL | Status: DC | PRN
  Administered 2020-02-26 – 2020-02-27 (×2): 1 via NASAL
  Filled 2020-02-26 (×2): qty 44

## 2020-02-26 MED ORDER — GABAPENTIN 300 MG PO CAPS
ORAL_CAPSULE | ORAL | Status: AC
  Administered 2020-02-26: 08:00:00 300 mg via ORAL
  Filled 2020-02-26: qty 1

## 2020-02-26 MED ORDER — DIPHENHYDRAMINE HCL 12.5 MG/5ML PO ELIX
12.5000 mg | ORAL_SOLUTION | Freq: Four times a day (QID) | ORAL | Status: DC | PRN
Start: 2020-02-26 — End: 2020-02-28
  Filled 2020-02-26: qty 5

## 2020-02-26 MED ORDER — FENTANYL CITRATE (PF) 100 MCG/2ML IJ SOLN
INTRAMUSCULAR | Status: DC | PRN
  Administered 2020-02-26: 100 ug via INTRAVENOUS

## 2020-02-26 MED ORDER — CHLORHEXIDINE GLUCONATE 0.12 % MT SOLN
OROMUCOSAL | Status: AC
  Administered 2020-02-26: 09:00:00 15 mL
  Filled 2020-02-26: qty 15

## 2020-02-26 MED ORDER — CELECOXIB 200 MG PO CAPS: 200.0000 mg | ORAL_CAPSULE | ORAL | Status: AC

## 2020-02-26 SURGICAL SUPPLY — 57 items
ADH SKN CLS APL DERMABOND .7 (GAUZE/BANDAGES/DRESSINGS) ×1
APL PRP STRL LF DISP 70% ISPRP (MISCELLANEOUS) ×1
CANISTER SUCT 1200ML W/VALVE (MISCELLANEOUS) ×2 IMPLANT
CANNULA REDUC XI 12-8 STAPL (CANNULA) ×2
CANNULA REDUCER 12-8 DVNC XI (CANNULA) ×1 IMPLANT
CHLORAPREP W/TINT 26 (MISCELLANEOUS) ×2 IMPLANT
COVER WAND RF STERILE (DRAPES) ×2 IMPLANT
DECANTER SPIKE VIAL GLASS SM (MISCELLANEOUS) ×2 IMPLANT
DEFOGGER SCOPE WARMER CLEARIFY (MISCELLANEOUS) ×2 IMPLANT
DERMABOND ADVANCED (GAUZE/BANDAGES/DRESSINGS) ×1
DERMABOND ADVANCED .7 DNX12 (GAUZE/BANDAGES/DRESSINGS) ×1 IMPLANT
DRAPE 3/4 80X56 (DRAPES) ×2 IMPLANT
DRAPE ARM DVNC X/XI (DISPOSABLE) ×4 IMPLANT
DRAPE COLUMN DVNC XI (DISPOSABLE) ×1 IMPLANT
DRAPE DA VINCI XI ARM (DISPOSABLE) ×8
DRAPE DA VINCI XI COLUMN (DISPOSABLE) ×2
ELECT CAUTERY BLADE 6.4 (BLADE) ×2 IMPLANT
ELECT REM PT RETURN 9FT ADLT (ELECTROSURGICAL) ×2
ELECTRODE REM PT RTRN 9FT ADLT (ELECTROSURGICAL) ×1 IMPLANT
GLOVE BIO SURGEON STRL SZ7 (GLOVE) ×6 IMPLANT
GOWN STRL REUS W/ TWL LRG LVL3 (GOWN DISPOSABLE) ×4 IMPLANT
GOWN STRL REUS W/TWL LRG LVL3 (GOWN DISPOSABLE) ×8
GRASPER LAPSCPC 5X45 DSP (INSTRUMENTS) ×2 IMPLANT
IRRIGATION STRYKERFLOW (MISCELLANEOUS) IMPLANT
IRRIGATOR STRYKERFLOW (MISCELLANEOUS) ×2
IV NS 1000ML (IV SOLUTION) ×2
IV NS 1000ML BAXH (IV SOLUTION) IMPLANT
KIT PINK PAD W/HEAD ARE REST (MISCELLANEOUS) ×2
KIT PINK PAD W/HEAD ARM REST (MISCELLANEOUS) ×1 IMPLANT
KIT TURNOVER CYSTO (KITS) ×2 IMPLANT
LABEL OR SOLS (LABEL) ×2 IMPLANT
MANIFOLD NEPTUNE II (INSTRUMENTS) ×2 IMPLANT
NEEDLE HYPO 22GX1.5 SAFETY (NEEDLE) ×2 IMPLANT
OBTURATOR OPTICAL STANDARD 8MM (TROCAR) ×2
OBTURATOR OPTICAL STND 8 DVNC (TROCAR) ×1
OBTURATOR OPTICALSTD 8 DVNC (TROCAR) ×1 IMPLANT
PACK LAP CHOLECYSTECTOMY (MISCELLANEOUS) ×2 IMPLANT
PENCIL ELECTRO HAND CTR (MISCELLANEOUS) ×2 IMPLANT
SEAL CANN UNIV 5-8 DVNC XI (MISCELLANEOUS) ×3 IMPLANT
SEAL XI 5MM-8MM UNIVERSAL (MISCELLANEOUS) ×6
SEALER VESSEL DA VINCI XI (MISCELLANEOUS) ×2
SEALER VESSEL EXT DVNC XI (MISCELLANEOUS) ×1 IMPLANT
SOLUTION ELECTROLUBE (MISCELLANEOUS) ×2 IMPLANT
SPONGE LAP 18X18 RF (DISPOSABLE) ×2 IMPLANT
STAPLER CANNULA SEAL DVNC XI (STAPLE) ×1 IMPLANT
STAPLER CANNULA SEAL XI (STAPLE) ×2
SUT MNCRL 4-0 (SUTURE) ×2
SUT MNCRL 4-0 27XMFL (SUTURE) ×1
SUT SILK 2 0 SH (SUTURE) ×5 IMPLANT
SUT VICRYL 0 AB UR-6 (SUTURE) ×4 IMPLANT
SUT VLOC 90 S/L VL9 GS22 (SUTURE) ×2 IMPLANT
SUTURE MNCRL 4-0 27XMF (SUTURE) ×1 IMPLANT
TAPE TRANSPORE STRL 2 31045 (GAUZE/BANDAGES/DRESSINGS) ×2 IMPLANT
TRAY FOLEY SLVR 16FR LF STAT (SET/KITS/TRAYS/PACK) ×2 IMPLANT
TROCAR BALLN GELPORT 12X130M (ENDOMECHANICALS) ×2 IMPLANT
TROCAR XCEL NON-BLD 5MMX100MML (ENDOMECHANICALS) ×2 IMPLANT
TUBING EVAC SMOKE HEATED PNEUM (TUBING) ×2 IMPLANT

## 2020-02-26 NOTE — Anesthesia Postprocedure Evaluation (Signed)
Anesthesia Post Note  Patient: [name]  Procedure(s) Performed: XI ROBOTIC ASSISTED LAPAROSCOPIC NISSEN FUNDOPLICATION (N/A Abdomen)  Patient location during evaluation: PACU Anesthesia Type: General Level of consciousness: awake and alert Pain management: pain level controlled Vital Signs Assessment: post-procedure vital signs reviewed and stable Respiratory status: spontaneous breathing, nonlabored ventilation, respiratory function stable and patient connected to nasal cannula oxygen Cardiovascular status: blood pressure returned to baseline and stable Postop Assessment: no apparent nausea or vomiting Anesthetic complications: no   No complications documented.   Last Vitals:  Vitals:   02/26/20 1416 02/26/20 1429  BP:    Pulse: 93 95  Resp: 11 14  Temp:    SpO2: 96% 96%    Last Pain:  Vitals:   02/26/20 1429  TempSrc:   PainSc: Asleep                 Arita Miss

## 2020-02-26 NOTE — Anesthesia Preprocedure Evaluation (Addendum)
Anesthesia Evaluation  Patient identified by MRN, date of birth, ID band Patient awake    Reviewed: Allergy & Precautions, H&P , NPO status , Patient's Chart, lab work & pertinent test results  History of Anesthesia Complications (+) PONV and history of anesthetic complications  Airway Mallampati: II  TM Distance: >3 FB Neck ROM: full    Dental no notable dental hx. (+) Teeth Intact   Pulmonary asthma , sleep apnea (no cpap) ,  Well controlled asthma, singulair only, no rescue inhaler, never hospitalized   Pulmonary exam normal breath sounds clear to auscultation       Cardiovascular Exercise Tolerance: Good hypertension, + dysrhythmias (p. afib) Atrial Fibrillation  Rhythm:Regular Rate:Normal - Systolic murmurs S/p cardioversion, now in sinus   Neuro/Psych  Headaches, PSYCHIATRIC DISORDERS Anxiety Depression Chronic LBPain; on opioids  S/p ACDF   Neuromuscular disease    GI/Hepatic Neg liver ROS, PUD, GERD  Medicated and Poorly Controlled,ibs   Endo/Other  negative endocrine ROS  Renal/GU negative Renal ROS  negative genitourinary   Musculoskeletal  (+) Arthritis , Ddd; Chronic neck pain;  ANTERIOR FUSION CERVICAL SPINE   Abdominal   Peds  Hematology negative hematology ROS (+)   Anesthesia Other Findings echo: 4/21: ef=55%; cath: 3/21: no cad; cardioversion: 9/21: afib to nsr; ekg: 10/21: nsr;  Past Medical History: 2021: A-fib ([place]) No date: Abnormal EKG     Comment:  HX OF INVERTED T WAVES ON EKG, PALPITATIONS, CHEST               PAINS-CARDIAC WORK UP DID NOT SHOW ANY HEART DISEASE No date: AC (acromioclavicular) joint bone spurs 01/04/2017: Acute postoperative pain 08/15/2004: [place] anemia No date: Anemia     Comment:  Iron Infusion-8 yrs ago No date: Anxiety No date: Asthma 08/18/2014: Cephalalgia 08/18/2014: Cervical disc disease     Comment:  Needs neck surgery.  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 04/22/2013: Dizziness 04/27/2003: Duodenal ulcer with hemorrhage and perforation ([place]) No date: Foot drop, right     Comment:  FROM BACK SURGERY No date: GERD (gastroesophageal reflux disease) 08/18/2014: H/O arthrodesis No date: Headache(784.0)     Comment:  AND NECK PAIN--STATES RECENT TEST SHOW CERVICAL               DEGENERATION No date: History of blood transfusion     Comment:  s/p back surgery No date: History of cardioversion 01/04/2015: History of cervical spinal surgery No date: History of kidney stones No date: Hypertension No date: Inverted T wave 08/18/2014: Narrowing of intervertebral disc space     Comment:  Currently on disability.  04/22/2013: Orthostatic hypotension No date: Pain     Comment:  CHRONIC NECK AND BACK PAIN - LIMITED ROM NECK - S/P               FUSIONS CERVICAL AND LUMBAR No date: Pneumonia No date: PONV (postoperative nausea and vomiting)     Comment:  PT GIVES HX OF N&V AND FEVER WITH SURGERIES YEARS               AGO--BUT NO PROBLEMS WITH MORE RECENT SURGERIES--STATES               NOT  MALIGNANT HYPERTHERMIA 05/14/2012: Postop Hyponatremia 05/14/2012: Postoperative anemia due to acute blood loss No date: PTSD (post-traumatic stress disorder) No date: Right foot drop 08/18/2014: Right hip arthralgia     Comment:  Status post surgery of right, and now needs left.  2021: Sleep apnea     Comment:  does not have a cpap No date: Therapeutic opioid-induced constipation (OIC)   Reproductive/Obstetrics negative OB ROS                             Anesthesia Physical  Anesthesia Plan  ASA: III  Anesthesia Plan: General   Post-op Pain Management:    Induction:  Intravenous and Rapid sequence  PONV Risk Score and Plan: 4 or greater and TIVA, Propofol infusion, Ondansetron, Midazolam, Treatment may vary due to age or medical condition and Scopolamine patch - Pre-op  Airway Management Planned: Oral ETT and Video Laryngoscope Planned  Additional Equipment: None  Intra-op Plan:   Post-operative Plan: Extubation in OR  Informed Consent: I have reviewed the patients History and Physical, chart, labs and discussed the procedure including the risks, benefits and alternatives for the proposed anesthesia with the patient or authorized representative who has indicated his/her understanding and acceptance.     Dental advisory given  Plan Discussed with: CRNA  Anesthesia Plan Comments: (Discussed risks of anesthesia with patient, including PONV, sore throat, lip/dental damage. Rare risks discussed as well, such as aspiration, cardiorespiratory and neurological sequelae. Patient understands.  Pt has hx vertigo but willing to try scopolamine patch; educated her on side effects.  Patient has listed allergy to penicillins - hives. Has tolerated cefazolin at least twice during previous surgeries. Severe blistering skin reaction (SJS/TEN)? no Liver or kidney injury caused by PCN? no Hemolytic anemia from PCN? no Drug fever? no Painful swollen joints? no Severe reaction involving inside of mouth, eye, or genital ulcers? no Based on current evidence Alfonse Alpers et al, J Allergy Clin Immunol Pract, 2019), will proceed with cefazolin use: Yes  )       Anesthesia Quick Evaluation

## 2020-02-26 NOTE — Transfer of Care (Signed)
Immediate Anesthesia Transfer of Care Note  Patient: [name]  Procedure(s) Performed: XI ROBOTIC ASSISTED LAPAROSCOPIC NISSEN FUNDOPLICATION (N/A Abdomen)  Patient Location: PACU  Anesthesia Type:General  Level of Consciousness: drowsy  Airway & Oxygen Therapy: Patient Spontanous Breathing and Patient connected to face mask oxygen  Post-op Assessment: Report given to RN and Post -op Vital signs reviewed and stable  Post vital signs: Reviewed and stable  Last Vitals:  Vitals Value Taken Time  BP 151/82 02/26/20 1221  Temp 36.4 C 02/26/20 1221  Pulse 76 02/26/20 1224  Resp 26 02/26/20 1224  SpO2 100 % 02/26/20 1224  Vitals shown include unvalidated device data.  Last Pain:  Vitals:   02/26/20 1221  TempSrc:   PainSc: 0-No pain         Complications: No complications documented.

## 2020-02-26 NOTE — Interval H&P Note (Signed)
History and Physical Interval Note:  02/26/2020 8:54 AM  [name]  has presented today for surgery, with the diagnosis of GERD, hiatal hernia.  The various methods of treatment have been discussed with the patient and family. After consideration of risks, benefits and other options for treatment, the patient has consented to  Procedure(s): XI ROBOTIC ASSISTED LAPAROSCOPIC NISSEN FUNDOPLICATION (N/A) as a surgical intervention.  The patient's history has been reviewed, patient examined, no change in status, stable for surgery.  I have reviewed the patient's chart and labs.  Questions were answered to the patient's satisfaction.     [place]

## 2020-02-26 NOTE — Op Note (Signed)
Robotic assisted laparoscopic Nissen fundoplication w repair of  hiatal hernia  Pre-operative Diagnosis: GERD, hiatal hernia  Post-operative Diagnosis: same  Procedure:  Robotic assisted laparoscopic Nissen fundoplication w repair of  hiatal hernia  Surgeon: Caroleen Hamman, MD FACS  Assistant: Kipp Laurence. Required due to the complexity of the case the need for exposure and lack of first assist.  Anesthesia: Gen. with endotracheal tube  Findings: Sliding hiatal hernia Loose wrap 360 degree over 52 FR Bougie   Estimated Blood Loss: 35KK            Complications: none   Procedure Details  The patient was seen again in the Holding Room. The benefits, complications, treatment options, and expected outcomes were discussed with the patient. The risks of bleeding, infection, recurrence of symptoms, failure to resolve symptoms,  esophageal damage, Dysphagia, bowel injury, any of which could require further surgery were reviewed with the patient. The likelihood of improving the patient's symptoms with return to their baseline status is good.  The patient and/or family concurred with the proposed plan, giving informed consent.  The patient was taken to Operating Room, identified  and the procedure verified.  A Time Out was held and the above information confirmed.  Prior to the induction of general anesthesia, antibiotic prophylaxis was administered. VTE prophylaxis was in place. General endotracheal anesthesia was then administered and tolerated well. After the induction, the abdomen was prepped with Chloraprep and draped in the sterile fashion. The patient was positioned in the supine position.  Cut down technique was used to enter the abdominal cavity and a Hasson trochar was placed after two vicryl stitches were anchored to the fascia. Pneumoperitoneum was then created with CO2 and tolerated well without any adverse changes in the patient's vital signs.  Three 8-mm ports were placed under  direct vision. All skin incisions  were infiltrated with a local anesthetic agent before making the incision and placing the trocars. An additional 5 mm regular laparoscopic port was placed to assist with retraction and exposure.   The patient was positioned  in reverse Trendelenburg, robot was brought to the surgical field and docked in the standard fashion.  We made sure all the instrumentation was kept indirect view at all times and that there were no collision between the arms. I scrubbed out and went to the console.  I used a robotic arm to retract the liver, the vessel sealer on my right hand and a forced bipolar grasper on my left hand.  There is along the extra 5 mm port allow me ample exposure and the ability to perform meticulous dissection  We Started dividing the lesser omentum via the pars flaccida.  We Were able to dissect the lesser curvature of the stomach and  dissected the fundus free from the right and left crus.  We circumferentially dissected the GE junction.  The hernia sac was also completely reduced and we were able to bring the stomach into the intra-abdominal position.  Attention then was turned to the greater curvature where the short gastrics were divided with sealer device.  We were able to identify the left crus and again were able to make sure there was a good circumferential dissection and that the hernia sac was completely excised.  We did perform also some dissection within the mediastinum to allow a complete reduction of the sac and a to completely allow an intra-abdominal Nissen fundoplication.  2-0V lock suture was inserted and the crus as well as the hernia was closed  with a running suture  We Asked anesthesia to place a 52 French bougie and this went easily.  We also observe trajectory of the bougie. 360 degree Nissen fundoplication was created with multiple 2-0 silk sutures and we placed 3 stitches taking some of the esophagus within that bite.  The fundoplication  measured approximately 3-1/2 cm and it was floppy. I was very happy with the way the fundoplication laid and the repair of the hernia.   Inspection of the  upper quadrant was performed. No bleeding, bile  Or esophageal injuries leaks, or bowel injuries were noted. Robotic instruments and robotic arms were undocked in the standard fashion. All the needles were removed under direct visualization.   I scrubbed back in.  Pneumoperitoneum was released.  The periumbilical port site was closed with interrumpted 0 Vicryl sutures. 4-0 subcuticular Monocryl was used to close the skin. Liposomal marcaine was injected to all the incisions sites.  Dermabond was  applied.  The patient was then extubated and brought to the recovery room in stable condition. Sponge, lap, and needle counts were correct at closure and at the conclusion of the case.               Caroleen Hamman, MD, FACS

## 2020-02-26 NOTE — Anesthesia Procedure Notes (Signed)
Procedure Name: Intubation Date/Time: 02/26/2020 9:22 AM Performed by: Tollie Eth, CRNA Pre-anesthesia Checklist: Patient identified, Patient being monitored, Timeout performed, Emergency Drugs available and Suction available Patient Re-evaluated:Patient Re-evaluated prior to induction Oxygen Delivery Method: Circle system utilized Preoxygenation: Pre-oxygenation with 100% oxygen Induction Type: IV induction and Rapid sequence Laryngoscope Size: 3 and McGraph Grade View: Grade I Tube type: Oral Tube size: 6.5 mm Number of attempts: 1 Airway Equipment and Method: Stylet Placement Confirmation: ETT inserted through vocal cords under direct vision,  positive ETCO2 and breath sounds checked- equal and bilateral Secured at: 20 cm Tube secured with: Tape Dental Injury: Teeth and Oropharynx as per pre-operative assessment

## 2020-02-27 ENCOUNTER — Inpatient Hospital Stay: Payer: Medicare Other

## 2020-02-27 LAB — CBC
HCT: 32.3 % — ABNORMAL LOW (ref 36.0–46.0)
Hemoglobin: 10.2 g/dL — ABNORMAL LOW (ref 12.0–15.0)
MCH: 26.4 pg (ref 26.0–34.0)
MCHC: 31.6 g/dL (ref 30.0–36.0)
MCV: 83.5 fL (ref 80.0–100.0)
Platelets: 279 10*3/uL (ref 150–400)
RBC: 3.87 MIL/uL (ref 3.87–5.11)
RDW: 14.4 % (ref 11.5–15.5)
WBC: 5.8 10*3/uL (ref 4.0–10.5)
nRBC: 0 % (ref 0.0–0.2)

## 2020-02-27 LAB — BASIC METABOLIC PANEL
Anion gap: 9 (ref 5–15)
BUN: 11 mg/dL (ref 6–20)
CO2: 24 mmol/L (ref 22–32)
Calcium: 8.6 mg/dL — ABNORMAL LOW (ref 8.9–10.3)
Chloride: 105 mmol/L (ref 98–111)
Creatinine, Ser: 0.89 mg/dL (ref 0.44–1.00)
GFR, Estimated: >60 mL/min (ref >60)
Glucose, Bld: 110 mg/dL — ABNORMAL HIGH (ref 70–99)
Potassium: 4.2 mmol/L (ref 3.5–5.1)
Sodium: 138 mmol/L (ref 135–145)

## 2020-02-27 MED ORDER — CYCLOBENZAPRINE HCL 10 MG PO TABS: 10.0000 mg | ORAL_TABLET | Freq: Three times a day (TID) | ORAL | Status: DC | PRN

## 2020-02-27 MED ORDER — ALPRAZOLAM 0.5 MG PO TABS
1.0000 mg | ORAL_TABLET | Freq: Three times a day (TID) | ORAL | Status: DC | PRN
  Administered 2020-02-27: 21:00:00 1 mg via ORAL
  Filled 2020-02-27: qty 2

## 2020-02-27 NOTE — Progress Notes (Signed)
   02/27/20 1409  Clinical Encounter Type  Visited With Patient  Visit Type Follow-up  Referral From Chaplain  Consult/Referral To Chaplain  Chaplain stopped in to check on Pt. She said she was in pain and not feeling so well. Pt also said she needs her CPAC. While talking to Pt, the telephone rung, therefore chaplain left.

## 2020-02-27 NOTE — Progress Notes (Signed)
Placed patient on nasal cpap with o2. After a few minutes of use patient stated she was having a panic attack. She said she would work thru it as Therapist, sports has just given her xanax.  Updated RN.

## 2020-02-27 NOTE — Progress Notes (Signed)
[facility] Hospital Day(s): 1.   Post op day(s): 1 Day Post-Op.   Interval History:  Patient seen and examined No acute events or new complaints overnight.  Patient reports that "she is not doing well" this morning She complains of a "tightness" with trying to swallow her food and feels like she is "spitting" it back up.  She has abdominal pain, worse at her umbilicus, which radiates into her shoulders.  No fever, chills, nausea, emesis Laboratory work up is reassuring She is on full liquid diet, but again unsure how well she is tolerating this   Vital signs in last 24 hours: [min-max] current  Temp:  [97 F (36.1 C)-98 F (36.7 C)] 97.6 F (36.4 C) (12/17 0832) Pulse Rate:  [62-95] 62 (12/17 0832) Resp:  [11-27] 18 (12/17 0832) BP: (98-166)/(57-89) 121/75 (12/17 0832) SpO2:  [96 %-100 %] 96 % (12/17 0832)     Height: 5\' 4"  (162.6 cm) Weight: 65.3 kg BMI (Calculated): 24.71   Intake/Output last 2 shifts:  12/16 0701 - 12/17 0700 In: 1160 [P.O.:260; I.V.:800; IV Piggyback:100] Out: 205 [Urine:200; Blood:5]   Physical Exam:  Constitutional: alert, cooperative and no distress  Respiratory: breathing non-labored at rest  Cardiovascular: regular rate and sinus rhythm  Gastrointestinal: Soft, incisional soreness worse at her umbilicus, non-distended, no rebound/guarding Integumentary: Laparoscopic incisions are CDI with dermabond, no erythema or drainage  Labs:  CBC Latest Ref Rng & Units 02/27/2020 11/26/2019 07/10/2019  WBC 4.0 - 10.5 K/uL 5.8 6.4 5.8  Hemoglobin 12.0 - 15.0 g/dL 10.2(L) 13.3 12.8  Hematocrit 36.0 - 46.0 % 32.3(L) 40.2 39.3  Platelets 150 - 400 K/uL 279 427(H) 377   CMP Latest Ref Rng & Units 02/27/2020 02/20/2020 01/14/2020  Glucose 70 - 99 mg/dL 110(H) - -  BUN 6 - 20 mg/dL 11 - -  Creatinine 0.44 - 1.00 mg/dL 0.89 - 0.77  Sodium 135 - 145 mmol/L 138 - -  Potassium 3.5 - 5.1 mmol/L 4.2 3.2(L) -  Chloride 98 -  111 mmol/L 105 - -  CO2 22 - 32 mmol/L 24 - -  Calcium 8.9 - 10.3 mg/dL 8.6(L) - -  Total Protein 6.0 - 8.5 g/dL - - -  Total Bilirubin 0.0 - 1.2 mg/dL - - -  Alkaline Phos 44 - 121 IU/L - - -  AST 0 - 40 IU/L - - -  ALT 0 - 32 IU/L - - -    Imaging studies: No new pertinent imaging studies   Assessment/Plan:  59 y.o. female 1 Day Post-Op s/p robotic assisted laparoscopic hiatal hernia repair with nissen fundoplication.   - Continue full liquids; ADAT; I provided her with handout for Nissen Diet recommendation x1 month  - Wean from IVF  - Monitor abdominal examination; on-going bowel function  - Pain control prn; antiemetics prn  - Mobilization      - Discharge Planning: Patient not ready for discharge this morning, will reassess this afternoon. Otherwise anticipate DC in morning    All of the above findings and recommendations were discussed with the patient, and the medical team, and all of patient's questions were answered to her expressed satisfaction.  -- Edison Simon, PA-C [facility] 02/27/2020, 9:25 AM [phone] M-F: 7am - 4pm

## 2020-02-27 NOTE — Progress Notes (Signed)
notified MD and PA will supplement CPAP machine with 0.5-1L nasal cannula while waiting for CPAP machine since patient felt uncomfortable to sleep without o2 supply

## 2020-02-28 MED ORDER — SUCRALFATE 1 G PO TABS: 1.0000 g | ORAL_TABLET | Freq: Four times a day (QID) | ORAL | 1 refills | Status: DC

## 2020-02-28 MED ORDER — IBUPROFEN 600 MG PO TABS: 600.0000 mg | ORAL_TABLET | Freq: Four times a day (QID) | ORAL | 0 refills | Status: DC | PRN

## 2020-02-28 MED ORDER — ALPRAZOLAM 1 MG PO TABS: 1.0000 mg | ORAL_TABLET | Freq: Three times a day (TID) | ORAL | 0 refills | Status: DC | PRN

## 2020-02-28 MED ORDER — OXYCODONE HCL 5 MG PO TABS: 5.0000 mg | ORAL_TABLET | Freq: Four times a day (QID) | ORAL | 0 refills | Status: DC | PRN

## 2020-02-28 MED ORDER — PREGABALIN 100 MG PO CAPS: 100.0000 mg | ORAL_CAPSULE | Freq: Three times a day (TID) | ORAL | 0 refills | Status: DC

## 2020-02-28 MED ORDER — FUROSEMIDE 10 MG/ML IJ SOLN: 40.0000 mg | Freq: Once | INTRAMUSCULAR | Status: DC

## 2020-02-28 MED ORDER — SUCRALFATE 1 G PO TABS
1.0000 g | ORAL_TABLET | Freq: Four times a day (QID) | ORAL | Status: DC
  Administered 2020-02-28: 13:00:00 1 g via ORAL
  Filled 2020-02-28: qty 1

## 2020-02-28 NOTE — Discharge Summary (Signed)
Patient ID: [name] MRN: [id number] DOB/AGE: [birth year] 59 y.o.  Admit date: 02/26/2020 Discharge date: 02/28/2020   Discharge Diagnoses:  S/p repair hiatal hernia  Procedures: Robotic hiatal hernia repair with fundoplication  Hospital Course:  59 year old female with a small hiatal hernia hernia and severe GERD underwent an uneventful robotic hiatal hernia repair with mesh and Nissen fundoplication   Patient was kept for 2 nights.  Her first postoperative day she had significant postoperative pain and was treated with multimodal therapy to include Tylenol NSAIDs and pregabalin.  She does have chronic pain issues and takes schedule PO morphine. She subsequently continued to improve and did  well. At the time of discharge the patient was ambulating,  pain was controlled.  Her vital signs were stable and she was afebrile.   physical exam at discharge showed a pt  in no acute distress.  Awake and alert.  Abdomen: Soft incisions healing well without infection or peritonitis.  Extremities well-perfused and no edema.  Condition of the patient the time of discharge was stable She had some mild dysphagia which it was not expected given fundoplication. Carafate added for some reflux sxs.   Disposition: Discharge disposition: 01-Home or Self Care       Discharge Instructions    Call MD for:  difficulty breathing, headache or visual disturbances   Complete by: As directed    Call MD for:  extreme fatigue   Complete by: As directed    Call MD for:  hives   Complete by: As directed    Call MD for:  persistant dizziness or light-headedness   Complete by: As directed    Call MD for:  persistant nausea and vomiting   Complete by: As directed    Call MD for:  persistant nausea and vomiting   Complete by: As directed    Call MD for:  redness, tenderness, or signs of infection (pain, swelling, redness, odor or green/yellow discharge around incision site)   Complete by: As directed     Call MD for:  redness, tenderness, or signs of infection (pain, swelling, redness, odor or green/yellow discharge around incision site)   Complete by: As directed    Call MD for:  severe uncontrolled pain   Complete by: As directed    Call MD for:  severe uncontrolled pain   Complete by: As directed    Call MD for:  temperature >100.4   Complete by: As directed    Call MD for:  temperature >100.4   Complete by: As directed    Diet - low sodium heart healthy   Complete by: As directed    Discharge instructions   Complete by: As directed    Follow up in 3 weeks with Dr Dahlia Byes, if any issues before this call the office No lifting more than 15-20 lbs for 4-6 weeks Okay to shower tomorrow (12/17); do not scrub or submerge wounds Follow Nissen diet closely for at least 1 month   Discharge instructions   Complete by: As directed    Shower daily   Increase activity slowly   Complete by: As directed    Increase activity slowly   Complete by: As directed    Lifting restrictions   Complete by: As directed    No lifting more than 15-20 lbs for 4-6 weeks   Lifting restrictions   Complete by: As directed    Nissen diet and no heavy lifting for 6 weeks     Allergies as of  02/28/2020      Reactions   Amoxicillin Hives   She did ok w ANCEF   Chlorhexidine Gluconate Dermatitis, Hives   Clindamycin Hives   Codeine Hives   Erythromycin Hives   "mycins" in general   Penicillin G Hives   "cillins" in general   Sulfa Antibiotics Nausea And Vomiting, Hives       Levofloxacin Hives   Shellfish Allergy Hives   Decadron [dexamethasone] Other (See Comments)   Hot flashes, insomnia, "manic" Hot flashes, insomnia, "manic"   Mangifera Indica Hives   papaya   Papaya Derivatives Hives   Betadine [povidone Iodine] Hives       Clarithromycin Hives   Other Hives    Mango   Povidone-iodine Hives   Prednisone Anxiety   High blood pressure, flushed, mood changes, heart palpitations High blood  pressure, flushed, mood changes, heart palpitations      Medication List    TAKE these medications   acetaminophen 500 MG tablet Commonly known as: TYLENOL Take 500-1,000 mg by mouth every 6 (six) hours as needed for moderate pain.   ALPRAZolam 1 MG tablet Commonly known as: XANAX Take 1 mg by mouth at bedtime. What changed: Another medication with the same name was added. Make sure you understand how and when to take each.   ALPRAZolam 1 MG tablet Commonly known as: XANAX Take 1 tablet (1 mg total) by mouth 3 (three) times daily as needed for anxiety or sleep. What changed: You were already taking a medication with the same name, and this prescription was added. Make sure you understand how and when to take each.   amiodarone 200 MG tablet Commonly known as: PACERONE Take 1 tablet (200 mg total) by mouth daily. What changed: when to take this   apixaban 5 MG Tabs tablet Commonly known as: ELIQUIS Take 1 tablet (5 mg total) by mouth 2 (two) times daily.   BENGAY EX Apply 1 application topically daily as needed (pain).   cyclobenzaprine 5 MG tablet Commonly known as: FLEXERIL Take 1 tablet (5 mg total) by mouth 3 (three) times daily as needed for muscle spasms.   ibuprofen 600 MG tablet Commonly known as: ADVIL Take 1 tablet (600 mg total) by mouth every 6 (six) hours as needed.   linaclotide 290 MCG Caps capsule Commonly known as: LINZESS Take 1 capsule (290 mcg total) by mouth daily before breakfast.   montelukast 10 MG tablet Commonly known as: SINGULAIR TAKE 1 TABLET BY MOUTH EVERY DAY What changed: when to take this   morphine 15 MG tablet Commonly known as: MSIR Take 1 tablet (15 mg total) by mouth every 6 (six) hours as needed for moderate pain or severe pain. Must last 30 days. What changed: when to take this   morphine 15 MG tablet Commonly known as: MSIR Take 1 tablet (15 mg total) by mouth every 6 (six) hours as needed for moderate pain or severe  pain. Must last 30 days. Start taking on: March 22, 2020 What changed: Another medication with the same name was changed. Make sure you understand how and when to take each.   morphine 15 MG tablet Commonly known as: MSIR Take 1 tablet (15 mg total) by mouth every 6 (six) hours as needed for moderate pain or severe pain. Must last 30 days. Start taking on: April 21, 2020 What changed: Another medication with the same name was changed. Make sure you understand how and when to take each.   ondansetron  4 MG tablet Commonly known as: ZOFRAN Take 1 tablet (4 mg total) by mouth every 8 (eight) hours as needed for nausea or vomiting.   oxyCODONE 5 MG immediate release tablet Commonly known as: Oxy IR/ROXICODONE Take 1 tablet (5 mg total) by mouth every 6 (six) hours as needed for severe pain or breakthrough pain.   oxymetazoline 0.05 % nasal spray Commonly known as: AFRIN Place 1 spray into both nostrils 2 (two) times daily as needed for congestion.   pantoprazole 40 MG tablet Commonly known as: PROTONIX TAKE 1 TABLET BY MOUTH TWICE A DAY   polyethylene glycol 17 g packet Commonly known as: MIRALAX / GLYCOLAX Take 17 g by mouth daily as needed.   pregabalin 100 MG capsule Commonly known as: LYRICA Take 1 capsule (100 mg total) by mouth 3 (three) times daily.   sucralfate 1 g tablet Commonly known as: CARAFATE Take 1 tablet (1 g total) by mouth every 6 (six) hours.   valACYclovir 1000 MG tablet Commonly known as: VALTREX TAKE TWO TABLETS BY MOUTH TWICE DAILY FOR ONE DAY FEVER BLISTER       Follow-up Information    Talana Slatten, [place] F, MD. Schedule an appointment as soon as possible for a visit in 3 week(s).   Specialty: General Surgery Why: s/p Nissen Fundoplication  Contact information: [address] [place] [address] [phone]                Caroleen Hamman, MD FACS

## 2020-02-28 NOTE — TOC Transition Note (Signed)
Transition of Care [facility]) - CM/SW Discharge Note   Patient Details  Name: KEVIONNA HEFFLER MRN: [id number] Date of Birth: November 21, 1960  Transition of Care [facility]) CM/SW Contact:  Meriel Flavors, LCSW Phone Number: 02/28/2020, 11:12 AM   Clinical Narrative:    Nurse Leroy Kennedy contacted CSW about patient's CPAP. CSW spoke with nurse and attending to explain, TOC has not been consulted and therefore have no knowledge of patient's needs for any DME/CPAP. CSW did call patient and speak with her regarding this matter. Patient stated her PCP is the prescribing doctor and they assisted her in placing an order for the CPAP with [facility], but it has been a few months and patient has not received her CPAP. CSW explained to patient about the recent "Recall on CPAP's" which is likely contributing to the delay, however since it has been prescribed by her PCP and they have already placed the order the patient will need to contact her PCP and request further assistance as we do not have orders for her CPAP. Patient stated they had told her about the recent recall as well.         Patient Goals and CMS Choice        Discharge Placement                       Discharge Plan and Services                                     Social Determinants of Health (SDOH) Interventions     Readmission Risk Interventions No flowsheet data found.

## 2020-03-01 ENCOUNTER — Other Ambulatory Visit: Payer: Self-pay | Admitting: Family Medicine

## 2020-03-01 DIAGNOSIS — B001 Herpesviral vesicular dermatitis: Secondary | ICD-10-CM

## 2020-03-01 NOTE — Telephone Encounter (Signed)
Requested medication (s) are due for refill today: expired medication  Requested medication (s) are on the active medication list: yes   Last refill:  05/10/2018 #4 5 refills   Future visit scheduled: yes in 10 months   Notes to clinic:  expired medication, 02/26/20 reports patient not taking. Do you want to renew Rx?     Requested Prescriptions  Pending Prescriptions Disp Refills   valACYclovir (VALTREX) 1000 MG tablet [Pharmacy Med Name: VALACYCLOVIR HCL 1 GRAM TABLET] 4 tablet 5    Sig: TAKE TWO TABLETS BY MOUTH TWICE DAILY FOR ONE DAY FEVER BLISTER      Antimicrobials:  Antiviral Agents - Anti-Herpetic Passed - 03/01/2020  4:10 PM      Passed - Valid encounter within last 12 months    Recent Outpatient Visits           1 month ago Encounter for annual physical exam   [facility] [place], Dionne Bucy, MD   3 months ago Heart palpitations   [place], [place], [place]   3 months ago Gastroesophageal reflux disease with esophagitis without hemorrhage   [facility], Dionne Bucy, MD   8 months ago Essential hypertension   [place], Dionne Bucy, [place]   10 months ago [place], Dionne Bucy, MD       Future Appointments             In 3 weeks Agbor-Etang, Aaron Edelman, MD [facility], [facility]   In 10 months Bacigalupo, Dionne Bucy, MD [facility], [place]

## 2020-03-03 ENCOUNTER — Telehealth: Payer: Self-pay | Admitting: Pain Medicine

## 2020-03-03 NOTE — Telephone Encounter (Signed)
Patient had surgery on 16th, just got out of hospital, she was given 20 Oxycodone and 15 Xanax for post surgery pain and anxiety. Wanted to let our office know.

## 2020-03-09 ENCOUNTER — Other Ambulatory Visit: Payer: Self-pay | Admitting: Family Medicine

## 2020-03-09 NOTE — Telephone Encounter (Signed)
Requested medication (s) are due for refill today: yes  Requested medication (s) are on the active medication list: yes  Last refill:  12/19/19 #20 with 1 refill  Future visit scheduled: yes  Notes to clinic:  Please review for refill. Refill not delegated per protocol    Requested Prescriptions  Pending Prescriptions Disp Refills   ondansetron (ZOFRAN) 4 MG tablet [Pharmacy Med Name: ONDANSETRON HCL 4 MG TABLET] 20 tablet 1    Sig: TAKE 1 TABLET BY MOUTH EVERY 8 HOURS AS NEEDED FOR NAUSEA AND VOMITING      Not Delegated - Gastroenterology: Antiemetics Failed - 03/09/2020 10:45 AM      Failed - This refill cannot be delegated      Passed - Valid encounter within last 6 months    Recent Outpatient Visits           1 month ago Encounter for annual physical exam   [facility] [place], Marzella Schlein, MD   3 months ago Heart palpitations   [facility] [place], [place]   3 months ago Gastroesophageal reflux disease with esophagitis without hemorrhage   [facility], Marzella Schlein, [place]   8 months ago Essential hypertension   [facility] [place], Marzella Schlein, MD   10 months ago Dysuria   [facility] [place], Marzella Schlein, MD       Future Appointments             In 3 weeks Agbor-Etang, Arlys John, MD [facility], [facility]   In 10 months Bacigalupo, Marzella Schlein, MD [facility], [facility]

## 2020-03-16 ENCOUNTER — Other Ambulatory Visit: Payer: Self-pay

## 2020-03-16 ENCOUNTER — Telehealth: Payer: Self-pay | Admitting: *Deleted

## 2020-03-16 MED ORDER — METOCLOPRAMIDE HCL 10 MG PO TABS: 10.0000 mg | ORAL_TABLET | Freq: Three times a day (TID) | ORAL | 0 refills | Status: DC | PRN

## 2020-03-16 NOTE — Telephone Encounter (Signed)
Patient had surgery on 02/26/20 with Dr Everlene Farrier hiatal hernia. She stated the past 5 days she has been experiencing nausea. She can not eat anything and she cant even smell food without it making her nauseous. She has not vomited but have had the dry heaves. She has been having some stomach pain in the area of the hernia, but it comes and goes. She denies fever, chills and diarrhea/constapation

## 2020-03-16 NOTE — Telephone Encounter (Signed)
Per Dr. Everlene Farrier, he switched pt to Reglan 10 mg q 8 hours for nausea.  Spoke with pt about the plan. Verbalizes understanding.

## 2020-03-16 NOTE — Telephone Encounter (Signed)
Spoke with pt and she is currently taking Zofran with no relief and would like Dr. Everlene Farrier to switch her to something else.

## 2020-03-22 ENCOUNTER — Ambulatory Visit: Payer: Medicare Other | Admitting: Cardiology

## 2020-03-24 ENCOUNTER — Ambulatory Visit (INDEPENDENT_AMBULATORY_CARE_PROVIDER_SITE_OTHER): Payer: Medicare Other | Admitting: Surgery

## 2020-03-24 ENCOUNTER — Encounter: Payer: Self-pay | Admitting: Surgery

## 2020-03-24 ENCOUNTER — Other Ambulatory Visit: Payer: Self-pay

## 2020-03-24 VITALS — BP 136/86 | HR 65 | Temp 98.1°F | Ht 64.0 in | Wt 143.4 lb

## 2020-03-24 DIAGNOSIS — Z09 Encounter for follow-up examination after completed treatment for conditions other than malignant neoplasm: Secondary | ICD-10-CM

## 2020-03-24 MED ORDER — ONDANSETRON 4 MG PO TBDP: 4.0000 mg | ORAL_TABLET | Freq: Three times a day (TID) | ORAL | 0 refills | Status: DC | PRN

## 2020-03-24 MED ORDER — DIAZEPAM 5 MG PO TABS: 5.0000 mg | ORAL_TABLET | Freq: Three times a day (TID) | ORAL | 0 refills | Status: DC | PRN

## 2020-03-24 MED ORDER — METOCLOPRAMIDE HCL 5 MG PO TABS: 5.0000 mg | ORAL_TABLET | Freq: Four times a day (QID) | ORAL | 0 refills | Status: DC

## 2020-03-24 NOTE — Patient Instructions (Addendum)
Please pick up your medication at the pharmacy. Please see your follow up appointment listed below.

## 2020-03-26 ENCOUNTER — Telehealth: Payer: Self-pay | Admitting: Surgery

## 2020-03-26 DIAGNOSIS — G4733 Obstructive sleep apnea (adult) (pediatric): Secondary | ICD-10-CM | POA: Diagnosis not present

## 2020-03-26 NOTE — Telephone Encounter (Signed)
Patient is calling and patient is shaky and throwing up and when she takes her medication she is throwing it right back up, patient had surgery done with Dr. Dahlia Byes on 02/26/20 she had Nissen Fundoplication . Please call patient and advise.

## 2020-03-26 NOTE — Telephone Encounter (Signed)
Please have her come on Monday to the office

## 2020-03-26 NOTE — Telephone Encounter (Signed)
Scheduled pt for 03/30/2019 at 2:30 pm.

## 2020-03-27 ENCOUNTER — Other Ambulatory Visit: Payer: Self-pay | Admitting: Family Medicine

## 2020-03-27 DIAGNOSIS — J45909 Unspecified asthma, uncomplicated: Secondary | ICD-10-CM

## 2020-03-29 ENCOUNTER — Encounter: Payer: Medicare Other | Admitting: Surgery

## 2020-03-30 ENCOUNTER — Encounter: Payer: Self-pay | Admitting: Surgery

## 2020-03-30 NOTE — Progress Notes (Signed)
[name] is following for robotic Nissen fundoplication.  She states that she has some abdominal pain and some nausea as well.  Also feels some retrosternal esophageal spasms.  No fevers no chills.  She is taking some p.o. She does have a history of chronic pain and takes morphine as an outpatient   PE NAD Abd: soft, incisions c/d/i, appropiate incisiona tenderness  A/P ESOPHAGEAL spasms, those  are not uncommon.  She still relatively fresh from a Nissen fundoplication.  I have added some diazepam for esophageal spasms and she does have some Flexeril at home.  Like to wait a few weeks before formal work-up is started.  We will see her in a couple weeks

## 2020-03-31 ENCOUNTER — Telehealth: Payer: Self-pay | Admitting: Family Medicine

## 2020-03-31 ENCOUNTER — Other Ambulatory Visit: Payer: Self-pay

## 2020-03-31 ENCOUNTER — Other Ambulatory Visit: Payer: Self-pay | Admitting: *Deleted

## 2020-03-31 MED ORDER — AMIODARONE HCL 200 MG PO TABS: 200.0000 mg | ORAL_TABLET | Freq: Every day | ORAL | 0 refills | Status: DC

## 2020-03-31 MED ORDER — APIXABAN 5 MG PO TABS: 5.0000 mg | ORAL_TABLET | Freq: Two times a day (BID) | ORAL | 5 refills | Status: DC

## 2020-03-31 NOTE — Telephone Encounter (Signed)
Copied from [place] [id number]. Topic: General - Other >> Mar 31, 2020  1:20 PM Leward Quan A wrote: Reason for CRM: Violet with [facility] called in to inquire about a Quality of care grievance form and medical request that was faxed over on 03/25/20. Would like a call back at Ph# [phone] Ref case# [id number]

## 2020-04-02 ENCOUNTER — Ambulatory Visit: Payer: Medicare Other | Admitting: Cardiology

## 2020-04-02 ENCOUNTER — Other Ambulatory Visit: Payer: Self-pay

## 2020-04-02 ENCOUNTER — Encounter: Payer: Self-pay | Admitting: Cardiology

## 2020-04-02 VITALS — BP 150/106 | HR 63 | Ht 64.0 in | Wt 143.0 lb

## 2020-04-02 DIAGNOSIS — I4892 Unspecified atrial flutter: Secondary | ICD-10-CM

## 2020-04-02 DIAGNOSIS — I10 Essential (primary) hypertension: Secondary | ICD-10-CM

## 2020-04-02 DIAGNOSIS — R55 Syncope and collapse: Secondary | ICD-10-CM | POA: Diagnosis not present

## 2020-04-02 MED ORDER — AMLODIPINE BESYLATE 5 MG PO TABS: 5.0000 mg | ORAL_TABLET | Freq: Every day | ORAL | 5 refills | Status: DC

## 2020-04-02 NOTE — Patient Instructions (Signed)
Medication Instructions:  Your physician has recommended you make the following change in your medication:   1.  START Amlodipine 5 MG: Take 1 tablet (5 mg total) by mouth daily.  *If you need a refill on your cardiac medications before your next appointment, please call your pharmacy*   Lab Work: None Ordered If you have labs (blood work) drawn today and your tests are completely normal, you will receive your results only by: Marland Kitchen MyChart Message (if you have MyChart) OR . A paper copy in the mail If you have any lab test that is abnormal or we need to change your treatment, we will call you to review the results.   Testing/Procedures: None Ordered   Follow-Up: At [facility], you and your health needs are our priority.  As part of our continuing mission to provide you with exceptional heart care, we have created designated Provider Care Teams.  These Care Teams include your primary Cardiologist (physician) and Advanced Practice Providers (APPs -  Physician Assistants and Nurse Practitioners) who all work together to provide you with the care you need, when you need it.  We recommend signing up for the patient portal called "MyChart".  Sign up information is provided on this After Visit Summary.  MyChart is used to connect with patients for Virtual Visits (Telemedicine).  Patients are able to view lab/test results, encounter notes, upcoming appointments, etc.  Non-urgent messages can be sent to your provider as well.   To learn more about what you can do with MyChart, go to [email or web address].    Your next appointment:   4-6 week(s)  The format for your next appointment:   In Person  Provider:   Kate Sable, MD   Other Instructions

## 2020-04-02 NOTE — Progress Notes (Signed)
Cardiology Office Note:    Date:  04/02/2020   ID:  [name], DOB November 03, 1960, MRN [id number]  PCP:  Erasmo Downer, MD  Cardiologist:  Debbe Odea, MD  Electrophysiologist:  None   Referring MD: Erasmo Downer, MD   Chief Complaint  Patient presents with  . Follow-up    3 months  Pt states she has felt a couple of short flutters and some mild chest pain when she gets stressed.    History of Present Illness:    [name] is a 60 y.o. female with a hx of hypertension, paroxysmal atrial fibrillation/atrial flutter s/p DCCV  12/03/2019 on Eliquis, asthma, anxiety, GERD, presents for follow-up.    Patient is being seen for A. fib/atrial flutter.  Maintaining sinus rhythm since cardioversion.  Asymptomatic on amiodarone.  Amiodarone decreased after last visit to 200 mg daily.  Her blood pressures have been running higher of late in the 150s.  Recently had abdominal surgery for reflux.  Occasionally has chest discomfort which she thinks could be spasm.  She had a history of passing out prior to being evaluated.  Has not had dark since her cardioversion.  She is worried about passing out in the future, will avoid a cardiac monitor/life alert device which may call 911 in case she passes out.   Prior notes She was initially seen due to history of palpitations. Holter monitor was placed and showed atrial fibrillation 3% burden.  Eliquis and Toprol-XL was started.   Echocardiogram  On 06/2019 showed normal systolic function, EF 55 to 60%, mild LA dilatation.   Left heart cath on 05/2019 did not show any significant CAD.   Past Medical History:  Diagnosis Date  . A-fib (HCC) 2021  . Abnormal EKG    HX OF INVERTED T WAVES ON EKG, PALPITATIONS, CHEST PAINS-CARDIAC WORK UP DID NOT SHOW ANY HEART DISEASE  . AC (acromioclavicular) joint bone spurs   . Acute postoperative pain 01/04/2017  . 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 Anemia    Iron Infusion-8 yrs ago  . Anxiety    . Asthma   . Cephalalgia 08/18/2014  . Cervical disc disease 08/18/2014   Needs neck surgery.   . Chronic headaches   . Chronic, continuous use of opioids   . DDD (degenerative disc disease)    CERVICAL AND LUMBAR-CHRONIC PAIN, RT HIP LABRAL TEAR  . Depression    PT STATES A LOT OF STRESS IN HER LIFE  . Dissociative disorder   . Dizziness 04/22/2013  . Duodenal ulcer with hemorrhage and perforation (HCC) 04/27/2003  . Foot drop, right    FROM BACK SURGERY  . GERD (gastroesophageal reflux disease)   . H/O arthrodesis 08/18/2014  . Headache(784.0)    AND NECK PAIN--STATES RECENT TEST SHOW CERVICAL DEGENERATION  . History of blood transfusion    s/p back surgery  . History of cardioversion   . History of cervical spinal surgery 01/04/2015  . History of kidney stones   . Hypertension   . Inverted T wave   . Narrowing of intervertebral disc space 08/18/2014   Currently on disability.   . Orthostatic hypotension 04/22/2013  . Pain    CHRONIC NECK AND BACK PAIN - LIMITED ROM NECK - S/P FUSIONS CERVICAL AND LUMBAR  . Pneumonia   . PONV (postoperative nausea and vomiting)    PT GIVES HX OF N&V AND FEVER WITH SURGERIES YEARS AGO--BUT NO PROBLEMS WITH MORE RECENT SURGERIES--STATES NOT MALIGNANT HYPERTHERMIA  . Postop  Hyponatremia 05/14/2012  . Postoperative anemia due to acute blood loss 05/14/2012  . PTSD (post-traumatic stress disorder)   . Right foot drop   . 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   . Sleep apnea 2021   does not have a cpap  . Therapeutic opioid-induced constipation (OIC)     Past Surgical History:  Procedure Laterality Date  . ABDOMINAL HYSTERECTOMY    . afib  05/2019  . ANTERIOR CERVICAL DECOMP/DISCECTOMY FUSION N/A 10/30/2012   Procedure: ACDF C5-6, EXPLORATION AND HARDWARE REMOVAL C6-7;  Surgeon: Venita Lick, MD;  Location: [facility] OR;  Service: Orthopedics;  Laterality: N/A;  . ANTERIOR FUSION CERVICAL SPINE  MAY 2012   AT  [facility]  . BACK SURGERY  2009   LUMBAR FUSION WITH RODS   . BREAST BIOPSY Right 2008   benign.- bx/clip  . BREAST EXCISIONAL BIOPSY Left 1998   benign  . BREAST REDUCTION SURGERY Bilateral 06/2016  . CARDIOVERSION N/A 12/03/2019   Procedure: CARDIOVERSION;  Surgeon: Debbe Odea, MD;  Location: [facility] ORS;  Service: Cardiovascular;  Laterality: N/A;  . CARPAL TUNNEL RELEASE  05-06-12   Right  . CHOLECYSTECTOMY    . COLONOSCOPY WITH PROPOFOL N/A 01/26/2017   Procedure: COLONOSCOPY WITH PROPOFOL;  Surgeon: Midge Minium, MD;  Location: [facility];  Service: Endoscopy;  Laterality: N/A;  . DIAGNOSTIC LAPAROSCOPIES - MULTIPLE FOR ENDOMETRIOSIS    . ESOPHAGEAL DILATION N/A 01/26/2017   Procedure: ESOPHAGEAL DILATION;  Surgeon: Midge Minium, MD;  Location: [facility];  Service: Endoscopy;  Laterality: N/A;  . ESOPHAGEAL MANOMETRY N/A 05/30/2017   Procedure: ESOPHAGEAL MANOMETRY (EM);  Surgeon: Midge Minium, MD;  Location: [facility] ENDOSCOPY;  Service: Endoscopy;  Laterality: N/A;  . ESOPHAGOGASTRODUODENOSCOPY (EGD) WITH PROPOFOL N/A 01/26/2017   Procedure: ESOPHAGOGASTRODUODENOSCOPY (EGD) WITH PROPOFOL;  Surgeon: Midge Minium, MD;  Location: [facility];  Service: Endoscopy;  Laterality: N/A;  . ESOPHAGOGASTRODUODENOSCOPY (EGD) WITH PROPOFOL N/A 01/30/2020   Procedure: ESOPHAGOGASTRODUODENOSCOPY (EGD) WITH PROPOFOL;  Surgeon: Midge Minium, MD;  Location: [facility];  Service: Endoscopy;  Laterality: N/A;  sleep apnea COVID + 01-15-20  . HIP ARTHROSCOPY  09/20/2011   Procedure: ARTHROSCOPY HIP;  Surgeon: Loanne Drilling, MD;  Location: [facility] ORS;  Service: Orthopedics;  Laterality: Right;  Right Hip Scope with Labral Debridement  . LEFT HEART CATH AND CORONARY ANGIOGRAPHY Left 06/10/2019   Procedure: LEFT HEART CATH AND CORONARY ANGIOGRAPHY;  Surgeon: Yvonne Kendall, MD;  Location: [facility] INVASIVE CV LAB;  Service: Cardiovascular;  Laterality: Left;  . NASAL SEPTUM  SURGERY  MARCH 2013   IN [place]  . PH IMPEDANCE STUDY N/A 05/30/2017   Procedure: PH IMPEDANCE STUDY;  Surgeon: Midge Minium, MD;  Location: [facility] ENDOSCOPY;  Service: Endoscopy;  Laterality: N/A;  . POLYPECTOMY N/A 01/26/2017   Procedure: POLYPECTOMY;  Surgeon: Midge Minium, MD;  Location: [facility];  Service: Endoscopy;  Laterality: N/A;  . POSTERIOR CERVICAL FUSION/FORAMINOTOMY N/A 04/16/2013   Procedure: REMOVAL CERVICAL PLATES AND INTERBODY CAGE/POSTERIOR CERVICAL SPINAL FUSION C4 - C6/C5 CORPECTOMY/C4 - C6 FUSION WITH ILIAC CREST BONE GRAFT;  Surgeon: Venita Lick, MD;  Location: [facility] OR;  Service: Orthopedics;  Laterality: N/A;  . RADIOFREQUENCY ABLATION NERVES    . REDUCTION MAMMAPLASTY Bilateral 05/2016  . RIGHT HIP ARTHROSCOPY FOR LABRAL TEAR  ABOUT 2010   2012 also  . SHOULDER ARTHROSCOPY  05-06-12   bone spur  . TONSILLECTOMY     AS A CHILD  .  TOTAL HIP ARTHROPLASTY Right 05/13/2012   Procedure: TOTAL HIP ARTHROPLASTY ANTERIOR APPROACH;  Surgeon: Gearlean Alf, MD;  Location: [facility];  Service: Orthopedics;  Laterality: Right;    Current Medications: Current Meds  Medication Sig  . acetaminophen (TYLENOL) 500 MG tablet Take 500-1,000 mg by mouth every 6 (six) hours as needed for moderate pain.   Marland Kitchen ALPRAZolam (XANAX) 1 MG tablet Take 1 mg by mouth at bedtime.   Marland Kitchen amiodarone (PACERONE) 200 MG tablet Take 1 tablet (200 mg total) by mouth daily.  Marland Kitchen amLODipine (NORVASC) 5 MG tablet Take 1 tablet (5 mg total) by mouth daily.  Marland Kitchen apixaban (ELIQUIS) 5 MG TABS tablet Take 1 tablet (5 mg total) by mouth 2 (two) times daily.  . cyclobenzaprine (FLEXERIL) 5 MG tablet Take 1 tablet (5 mg total) by mouth 3 (three) times daily as needed for muscle spasms.  Marland Kitchen linaclotide (LINZESS) 290 MCG CAPS capsule Take 1 capsule (290 mcg total) by mouth daily before breakfast.  . Menthol, Topical Analgesic, (BENGAY EX) Apply 1 application topically daily as needed (pain).  Marland Kitchen metoCLOPramide  (REGLAN) 10 MG tablet Take 1 tablet (10 mg total) by mouth every 8 (eight) hours as needed for nausea.  . montelukast (SINGULAIR) 10 MG tablet TAKE 1 TABLET BY MOUTH EVERY DAY  . morphine (MSIR) 15 MG tablet Take 1 tablet (15 mg total) by mouth every 6 (six) hours as needed for moderate pain or severe pain. Must last 30 days.  Derrill Memo ON 04/21/2020] morphine (MSIR) 15 MG tablet Take 1 tablet (15 mg total) by mouth every 6 (six) hours as needed for moderate pain or severe pain. Must last 30 days.  . ondansetron (ZOFRAN ODT) 4 MG disintegrating tablet Take 1 tablet (4 mg total) by mouth every 8 (eight) hours as needed for nausea or vomiting.  . pantoprazole (PROTONIX) 40 MG tablet TAKE 1 TABLET BY MOUTH TWICE A DAY (Patient taking differently: Take 40 mg by mouth 2 (two) times daily.)  . polyethylene glycol (MIRALAX / GLYCOLAX) 17 g packet Take 17 g by mouth daily as needed.  . valACYclovir (VALTREX) 1000 MG tablet TAKE TWO TABLETS BY MOUTH TWICE DAILY FOR ONE DAY FEVER BLISTER     Allergies:   Amoxicillin, Chlorhexidine gluconate, Clindamycin, Codeine, Erythromycin, Penicillin g, Sulfa antibiotics, Levofloxacin, Shellfish allergy, Decadron [dexamethasone], Mangifera indica, Papaya derivatives, Betadine [povidone iodine], Clarithromycin, Other, Povidone-iodine, and Prednisone   Social History   Socioeconomic History  . Marital status: Married    Spouse name: bruce  . Number of children: 2  . Years of education: Not on file  . Highest education level: Associate degree: occupational, Hotel manager, or vocational program  Occupational History    Comment: disabled  Tobacco Use  . Smoking status: Never Smoker  . Smokeless tobacco: Never Used  Vaping Use  . Vaping Use: Never used  Substance and Sexual Activity  . Alcohol use: No    Alcohol/week: 0.0 standard drinks  . Drug use: No  . Sexual activity: Yes  Other Topics Concern  . Not on file  Social History Narrative   Son, daughter  And 4  grandkids; raises 3 of the grandchildren   Social Determinants of Health   Financial Resource Strain: High Risk  . Difficulty of Paying Living Expenses: Very hard  Food Insecurity: Food Insecurity Present  . Worried About Charity fundraiser in the Last Year: Sometimes true  . Ran Out of Food in the Last Year: Often true  Transportation Needs: Unmet Transportation Needs  . Lack of Transportation (Medical): Yes  . Lack of Transportation (Non-Medical): Yes  Physical Activity: Inactive  . Days of Exercise per Week: 0 days  . Minutes of Exercise per Session: 0 min  Stress: Stress Concern Present  . Feeling of Stress : Very much  Social Connections: Moderately Isolated  . Frequency of Communication with Friends and Family: More than three times a week  . Frequency of Social Gatherings with Friends and Family: Never  . Attends Religious Services: Never  . Active Member of Clubs or Organizations: No  . Attends Archivist Meetings: Never  . Marital Status: Married     Family History: The patient's family history includes Aneurysm in her maternal grandmother and mother; Anxiety disorder in her grandchild; Bipolar disorder in her grandchild and sister; Breast cancer (age of onset: 68) in her paternal grandmother; Depression in her grandchild.  ROS:   Please see the history of present illness.     All other systems reviewed and are negative.  EKGs/Labs/Other Studies Reviewed:    The following studies were reviewed today:   EKG:  EKG is  ordered today.  The ekg ordered today demonstrates sinus rhythm, heart rate 63  Recent Labs: 07/10/2019: B Natriuretic Peptide 395.0; TSH 1.424 01/13/2020: ALT 8 02/27/2020: BUN 11; Creatinine, Ser 0.89; Hemoglobin 10.2; Platelets 279; Potassium 4.2; Sodium 138  Recent Lipid Panel    Component Value Date/Time   CHOL 183 01/13/2020 1117   TRIG 116 01/13/2020 1117   HDL 64 01/13/2020 1117   CHOLHDL 2.9 01/13/2020 1117   LDLCALC 99  01/13/2020 1117    Physical Exam:    VS:  BP (!) 150/106   Pulse 63   Ht 5\' 4"  (1.626 m)   Wt 143 lb (64.9 kg)   BMI 24.55 kg/m     Wt Readings from Last 3 Encounters:  04/02/20 143 lb (64.9 kg)  03/24/20 143 lb 6.4 oz (65 kg)  02/26/20 144 lb (65.3 kg)     GEN:  Well nourished, mild distress HEENT: Normal NECK: No JVD; No carotid bruits LYMPHATICS: No lymphadenopathy CARDIAC: RRR, no murmurs, rubs, gallops RESPIRATORY:  Clear to auscultation without rales, wheezing or rhonchi  ABDOMEN: Soft, non-tender, non-distended MUSCULOSKELETAL:  No edema; No deformity  SKIN: Warm and dry NEUROLOGIC:  Alert and oriented x 3 PSYCHIATRIC:  Normal affect   ASSESSMENT:    1. Atrial flutter, unspecified type ([place])   2. Essential hypertension   3. Syncope and collapse    PLAN:    In order of problems listed above:  1. Patient with history of atrial flutter, status post cardioversion, maintaining sinus rhythm.  CHA2DS2-VASc score 2 (gender, htn). continue Eliquis 5 mg twice daily, amiodarone to 200 mg daily.  Last echo has normal EF 55 to 60%, mild LA dilation. 2. History of hypertension, blood pressure elevated.  Also has symptoms of possible esophageal spasm.  Start amlodipine 5 mg daily. 3. History of passing out, has not had any episodes of syncope since cardioversion.  Interested in having a cardiac monitor/smart device for peace of mind, in case she passes out in the future which will alert EMS.  I think this is reasonable.  Prescription for 4. Is written.  Follow-up in 6 weeks.  Total encounter time 40 minutes  Greater than 50% was spent in counseling and coordination of care with the patient   This note was generated in part or whole with voice recognition software.  Voice recognition is usually quite accurate but there are transcription errors that can and very often do occur. I apologize for any typographical errors that were not detected and corrected.  Medication  Adjustments/Labs and Tests Ordered: Current medicines are reviewed at length with the patient today.  Concerns regarding medicines are outlined above.  Orders Placed This Encounter  Procedures  . EKG 12-Lead   Meds ordered this encounter  Medications  . amLODipine (NORVASC) 5 MG tablet    Sig: Take 1 tablet (5 mg total) by mouth daily.    Dispense:  30 tablet    Refill:  5    Patient Instructions  Medication Instructions:  Your physician has recommended you make the following change in your medication:   1.  START Amlodipine 5 MG: Take 1 tablet (5 mg total) by mouth daily.  *If you need a refill on your cardiac medications before your next appointment, please call your pharmacy*   Lab Work: None Ordered If you have labs (blood work) drawn today and your tests are completely normal, you will receive your results only by: Marland Kitchen MyChart Message (if you have MyChart) OR . A paper copy in the mail If you have any lab test that is abnormal or we need to change your treatment, we will call you to review the results.   Testing/Procedures: None Ordered   Follow-Up: At [facility], you and your health needs are our priority.  As part of our continuing mission to provide you with exceptional heart care, we have created designated Provider Care Teams.  These Care Teams include your primary Cardiologist (physician) and Advanced Practice Providers (APPs -  Physician Assistants and Nurse Practitioners) who all work together to provide you with the care you need, when you need it.  We recommend signing up for the patient portal called "MyChart".  Sign up information is provided on this After Visit Summary.  MyChart is used to connect with patients for Virtual Visits (Telemedicine).  Patients are able to view lab/test results, encounter notes, upcoming appointments, etc.  Non-urgent messages can be sent to your provider as well.   To learn more about what you can do with MyChart, go to  [email or web address].    Your next appointment:   4-6 week(s)  The format for your next appointment:   In Person  Provider:   Kate Sable, MD   Other Instructions      Signed, Kate Sable, MD  04/02/2020 12:47 PM    [place]

## 2020-04-02 NOTE — Telephone Encounter (Signed)
Called Violet back and went to VM. Left VM asking her to call back if she still needs to discuss.  Looks like sleep study results and phone notes from 7/16, 7/23, 8/18, 10/11, and 10/27 are the ones to send in. She had OV on 9/2 and 9/15 with no mention of sleep apnea or CPAP, so I guess we could send those as well.  Complaint fax was given to Judson Roch last week.  Happy to speak to her if needed.

## 2020-04-03 ENCOUNTER — Other Ambulatory Visit: Payer: Self-pay | Admitting: Pain Medicine

## 2020-04-03 DIAGNOSIS — G8929 Other chronic pain: Secondary | ICD-10-CM

## 2020-04-08 ENCOUNTER — Telehealth: Payer: Self-pay

## 2020-04-08 NOTE — Telephone Encounter (Signed)
Copied from [place] [id number]. Topic: General - Inquiry >> Apr 08, 2020  1:02 PM Greggory Keen D wrote: Reason for CRM: Pt called saying she spoke to [facility] about problems she is having with her CPap and they told her to have Dr. B her primary to fax to them an order for a face mask for her CPap machine.  She was using the nasal pillow and she cant use that.  Fax #  [phone]

## 2020-04-09 NOTE — Telephone Encounter (Signed)
Please advise 

## 2020-04-12 ENCOUNTER — Telehealth: Payer: Self-pay

## 2020-04-12 NOTE — Telephone Encounter (Signed)
Please schedule an appointment.

## 2020-04-12 NOTE — Telephone Encounter (Signed)
Pt wants a refill on PRN muscle relaxer pain med

## 2020-04-12 NOTE — Telephone Encounter (Signed)
Rx written. Ok to fax

## 2020-04-14 ENCOUNTER — Encounter: Payer: Self-pay | Admitting: Surgery

## 2020-04-15 ENCOUNTER — Telehealth: Payer: Self-pay

## 2020-04-15 NOTE — Telephone Encounter (Signed)
Spoke to Production assistant, radio with [facility] and walked her through the timeline.  She was satisfied with report and will review records and hope to close the case.  Ok to fax record today.

## 2020-04-15 NOTE — Telephone Encounter (Signed)
Copied from [place] [id number]. Topic: General - Other >> Apr 15, 2020 11:42 AM Erick Blinks wrote: Reason for CRM: Violet, calling from [facility] called requesting a call back regarding case below  [id number] case number - please advise   Best contact [phone]

## 2020-04-19 ENCOUNTER — Other Ambulatory Visit: Payer: Self-pay

## 2020-04-19 ENCOUNTER — Ambulatory Visit (INDEPENDENT_AMBULATORY_CARE_PROVIDER_SITE_OTHER): Payer: Medicare Other | Admitting: Surgery

## 2020-04-19 ENCOUNTER — Encounter: Payer: Self-pay | Admitting: Surgery

## 2020-04-19 VITALS — BP 127/79 | HR 71 | Temp 98.3°F | Ht 64.0 in | Wt 143.4 lb

## 2020-04-19 DIAGNOSIS — Z09 Encounter for follow-up examination after completed treatment for conditions other than malignant neoplasm: Secondary | ICD-10-CM

## 2020-04-19 NOTE — Progress Notes (Signed)
PROVIDER NOTE: Information contained herein reflects review and annotations entered in association with encounter. Interpretation of such information and data should be left to medically-trained personnel. Information provided to patient can be located elsewhere in the medical record under "Patient Instructions". Document created using STT-dictation technology, any transcriptional errors that may result from process are unintentional.    Patient: [name]  Service Category: E/M  Provider: Gaspar Cola, MD  DOB: 10/20/1960  DOS: 04/21/2020  Specialty: Interventional Pain Management  MRN: [id number]  Setting: Ambulatory outpatient  PCP: Virginia Crews, MD  Type: Established Patient    Referring Provider: Virginia Crews, MD  Location: Office  Delivery: Face-to-face     HPI  [name], a 60 y.o. year old female, is here today because of her Cervical radiculitis [M54.12]. Ms. Choma primary complain today is Shoulder Pain (Left shoulder) Last encounter: My last encounter with her was on 04/03/2020. Pertinent problems: Ms. 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); Chronic hip pain (2ry area of Pain) (Bilateral) (L>R); Chronic shoulder arthropathy (Left); Trigger point of shoulder region  (Left); Enthesopathy of hip region (Left); Groin pain, chronic, left; Other intervertebral disc degeneration, lumbar region; History of lumbar surgery; DDD (degenerative disc disease), lumbosacral; Neuropathic pain; Neurogenic pain; Spondylosis without myelopathy or radiculopathy, lumbosacral region; Chronic musculoskeletal pain; Cervical radiculitis (C4,C5,C8) (Left); DDD (degenerative disc disease), cervical; Foraminal stenosis of cervical region (C3-4) (Right); 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 Pain Assessment: Severity of Chronic pain is reported as a 5 /10. Location: Shoulder Left/fingers on left hand becomes numb. Onset: More than a month ago. Quality: Aching,Stabbing,Constant (3 finger on her left hand become numb, come and goes). Timing: Constant. Modifying factor(s): meds, mucsle rub, heat. Vitals:  height is _0  (1.626 m) and weight is 140 lb (63.5 kg). Her temperature is 96.9 F (36.1 C) (abnormal). Her blood pressure is 137/85 and her pulse is 65. Her oxygen saturation is 100%.   Reason for encounter: worsening of previously known (established) problem. The patient returns to the clinic today indicating that her left shoulder pain has returned. On 01/04/2017, I did a left-sided suprascapular nerve radiofrequency ablation that provided her with relief of the pain until recently when he started coming back. She has also been experiencing some pain going down to the left ring finger and pinky finger of her left hand and what appears to be a C8 radiculitis. She does have a history of a prior cervical surgery. She indicates that she has an upcoming appointment to see a spine surgeon for her thoracic spine. She was wondering if they could also evaluate her neck. I told her that with the symptoms that she is experiencing, I would probably be  appropriate to repeat a cervical MRI. She reminded me that she is claustrophobic and therefore we will order that  MRI to be an "open MRI". Her last MRI was done around 2015. Unfortunately, the patient has an allergy to contrast and she does not tolerate opioids and therefore I can either give her a prescription for an opioid taper nor do a cervical epidural steroid injection, which would be what I would prefered to do.  RTCB: 07/20/2020  Pharmacotherapy Assessment   Analgesic: Morphine IR 15 mg 1 tab p.o. every 6 hours (60 mg/day of morphine) MME/day: 60 mg/day.   Monitoring: [place] PMP: PDMP reviewed during this encounter.       Pharmacotherapy: No side-effects or adverse reactions reported. Compliance: No problems identified. Effectiveness: Clinically acceptable.  Chauncey Fischer, RN  04/21/2020 10:56 AM  Sign when Signing Visit Safety precautions to be maintained throughout the outpatient stay will include: orient to surroundings, keep bed in low position, maintain call bell within reach at all times, provide assistance with transfer out of bed and ambulation.     UDS:  Summary  Date Value Ref Range Status  08/20/2019 Note  Final    Comment:    ==================================================================== [employer] [facility] (MW) ==================================================================== Test                             Result       Flag       Units  Drug Present and Declared for Prescription Verification   Alprazolam                     132          EXPECTED   ng/mg creat   Alpha-hydroxyalprazolam        293          EXPECTED   ng/mg creat    Source of alprazolam is a scheduled prescription medication. Alpha-    hydroxyalprazolam is an expected metabolite of alprazolam.    Oxycodone                      2443         EXPECTED   ng/mg creat   Oxymorphone                    922          EXPECTED   ng/mg creat   Noroxycodone                   >9434        EXPECTED    ng/mg creat   Noroxymorphone                 225          EXPECTED   ng/mg creat    Sources of oxycodone are scheduled prescription medications.    Oxymorphone, noroxycodone, and noroxymorphone are expected    metabolites of oxycodone. Oxymorphone is also available as a    scheduled prescription medication.  ==================================================================== Test                      Result    Flag   Units      Ref Range   Creatinine              106              mg/dL      >=20 ==================================================================== Declared Medications:  The flagging and interpretation on this report are  based on the  following declared medications.  Unexpected results may arise from  inaccuracies in the declared medications.   **Note: The testing scope of this panel includes these medications:   Alprazolam (Xanax)  Oxycodone   **Note: The testing scope of this panel does not include the  following reported medications:   Acetaminophen (Tylenol)  Albuterol (Ventolin HFA)  Apixaban (Eliquis)  Calcium (Tums)  Cyclobenzaprine (Flexeril)  Fluticasone (Advair)  Furosemide (Lasix)  Lubiprostone (Amitiza)  Metoprolol (Toprol)  Omeprazole (Prilosec)  Ondansetron (Zofran)  Pantoprazole (Protonix)  Salmeterol (Advair)  Valacyclovir (Valtrex) ==================================================================== For clinical consultation, please call [phone]. ====================================================================      ROS  Constitutional: Denies any fever or chills Gastrointestinal: No reported hemesis, hematochezia, vomiting, or acute GI distress Musculoskeletal: Denies any acute onset joint swelling, redness, loss of ROM, or weakness Neurological: No reported episodes of acute onset apraxia, aphasia, dysarthria, agnosia, amnesia, paralysis, loss of coordination, or loss of consciousness  Medication Review  ALPRAZolam,  Menthol (Topical Analgesic), acetaminophen, amLODipine, amiodarone, apixaban, linaclotide, montelukast, morphine, ondansetron, pantoprazole, polyethylene glycol, and valACYclovir  History Review  Allergy: Ms. Grasse is allergic to amoxicillin, chlorhexidine gluconate, clindamycin, codeine, erythromycin, penicillin g, sulfa antibiotics, levofloxacin, shellfish allergy, decadron [dexamethasone], mangifera indica, papaya derivatives, betadine [povidone iodine], clarithromycin, other, povidone-iodine, and prednisone. Drug: Ms. Mcenaney  reports no history of drug use. Alcohol:  reports no history of alcohol use. Tobacco:  reports that she has never smoked. She has never used smokeless tobacco. Social: Ms. Frayne  reports that she has never smoked. She has never used smokeless tobacco. She reports that she does not drink alcohol and does not use drugs. 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), Anemia, Anxiety, Asthma, Cephalalgia (08/18/2014), Cervical disc disease (08/18/2014), Chronic headaches, Chronic, continuous use of opioids, DDD (degenerative disc disease), Depression, Dissociative disorder, Dizziness (04/22/2013), Duodenal ulcer with hemorrhage and perforation ([place]) (04/27/2003), Foot drop, right, GERD (gastroesophageal reflux disease), H/O arthrodesis (08/18/2014), Headache(784.0), History of blood transfusion, History of cardioversion, History of cervical spinal surgery (01/04/2015), History of kidney stones, Hypertension, Inverted T wave, Narrowing of intervertebral disc space (08/18/2014), Orthostatic hypotension (04/22/2013), Pain, Pneumonia, PONV (postoperative nausea and vomiting), Postop Hyponatremia (05/14/2012), Postoperative anemia due to acute blood loss (05/14/2012), PTSD (post-traumatic stress disorder), Right foot drop, Right hip arthralgia (08/18/2014), Sleep apnea  (2021), and Therapeutic opioid-induced constipation (OIC). Surgical: Ms. Seel  has a past surgical history that includes RIGHT HIP ARTHROSCOPY FOR LABRAL TEAR (ABOUT 2010); Anterior fusion cervical spine (MAY 2012); Nasal septum surgery (MARCH 2013); Back surgery (2009); Tonsillectomy; Abdominal hysterectomy; DIAGNOSTIC LAPAROSCOPIES - MULTIPLE FOR ENDOMETRIOSIS; Cholecystectomy; Hip arthroscopy (09/20/2011); Shoulder arthroscopy (05-06-12); Carpal tunnel release (05-06-12); Total hip arthroplasty (Right, 05/13/2012); Anterior cervical decomp/discectomy fusion (N/A, 10/30/2012); Posterior cervical fusion/foraminotomy (N/A, 04/16/2013); Breast reduction surgery (Bilateral, 06/2016); Colonoscopy with propofol (N/A, 01/26/2017); Esophagogastroduodenoscopy (egd) with propofol (N/A, 01/26/2017); Esophageal dilation (N/A, 01/26/2017); polypectomy (N/A, 01/26/2017); PH impedance study (N/A, 05/30/2017); Esophageal manometry (N/A, 05/30/2017); Radiofrequency ablation nerves; Reduction mammaplasty (Bilateral, 05/2016); Breast biopsy (Right, 2008); Breast excisional biopsy (Left, 1998); afib (05/2019); LEFT HEART CATH AND CORONARY ANGIOGRAPHY (Left, 06/10/2019); Cardioversion (N/A, 12/03/2019); and Esophagogastroduodenoscopy (egd) with propofol (N/A, 01/30/2020). Family: family history includes Aneurysm in her maternal grandmother and mother; Anxiety disorder in her grandchild; Bipolar disorder in her grandchild and sister; Breast cancer (age of onset: 42) in her paternal grandmother; Depression in her grandchild.  Laboratory Chemistry Profile  Renal Lab Results  Component Value Date   BUN 11 02/27/2020   CREATININE 0.89 02/27/2020   BCR 7 (L) 11/11/2018   GFRAA >60 11/26/2019   GFRNONAA >60 02/27/2020     Hepatic Lab Results  Component Value Date   AST 15 01/13/2020   ALT 8 01/13/2020   ALBUMIN 4.2 01/13/2020   ALKPHOS 111 01/13/2020   LIPASE 23 04/06/2015     Electrolytes Lab Results  Component Value Date    NA 138 02/27/2020   K 4.2 02/27/2020   CL 105 02/27/2020   CALCIUM 8.6 (L) 02/27/2020   MG 1.9 11/11/2018     Bone Lab Results  Component Value Date   25OHVITD1 23 (L) 11/11/2018   25OHVITD2 9.8 11/11/2018   25OHVITD3 13 11/11/2018     Inflammation (CRP: Acute Phase) (ESR: Chronic Phase) Lab Results  Component Value Date   CRP 12 (H) 11/11/2018   ESRSEDRATE 42 (H) 11/11/2018       Note: Above Lab results reviewed.  Recent Imaging Review  DG Chest 2 View CLINICAL DATA:  Chest pain following surgery yesterday. Hernia repair.  EXAM: CHEST - 2 VIEW  COMPARISON:  CT chest 07/16/2017  FINDINGS: The heart size and mediastinal contours are within normal limits.  Left lower lobe linear atelectasis versus scarring. No focal consolidation. No pulmonary edema. No pleural effusion. No pneumothorax.  No acute osseous abnormality.  Partially visualized cervical as well as thoracolumbar surgical hardware. Surgical clips overlying the abdomen.  Gaseous distension of the bowel.  IMPRESSION: No active cardiopulmonary disease.  Electronically Signed   By: Iven Finn M.D.   On: 02/27/2020 17:00 Note: Reviewed        Physical Exam  General appearance: Well nourished, well developed, and well hydrated. In no apparent acute distress Mental status: Alert, oriented x 3 (person, place, & time)       Respiratory: No evidence of acute respiratory distress Eyes: PERLA Vitals: BP 137/85   Pulse 65   Temp (!) 96.9 F (36.1 C)   Ht $R'5\' 4"'pg$  (1.626 m)   Wt 140 lb (63.5 kg)   SpO2 100%   BMI 24.03 kg/m  BMI: Estimated body mass index is 24.03 kg/m as calculated from the following:   Height as of this encounter: $RemoveBeforeD'5\' 4"'uyvnfLWJSAyOfv$  (1.626 m).   Weight as of this encounter: 140 lb (63.5 kg). Ideal: Ideal body weight: 54.7 kg (120 lb 9.5 oz) Adjusted ideal body weight: 58.2 kg (128 lb 5.7 oz)  Assessment   Status Diagnosis  Controlled Controlled Controlled 1. Cervical radiculitis  (C4,C5,C8) (Left)   2. Chronic neck pain with history of cervical spinal surgery   3. Chronic shoulder radicular pain (Bilateral) (L>R)   4. Chronic shoulder arthropathy (Left)   5. Trigger point of shoulder region (Left)   6. Abnormal MRI, shoulder (03/20/2017)   7. Chronic anticoagulation (Eliquis)   8. Multiple allergies   9. Chronic pain syndrome   10. Cervicalgia      Updated Problems: Problem  Abnormal MRI, shoulder (03/20/2017)   FINDINGS: Examination is somewhat limited by patient motion. Rotator cuff: Mild to moderate rotator cuff tendinopathy/tendinosis but no partial or full-thickness tear. Muscles: Edema like signal abnormality in the supraspinatus muscle could be a muscle strain, partial tear or denervation process. The other muscles appear normal. Biceps long head:  Intact Acromioclavicular Joint: Mild degenerative changes type 1-2 acromion. No lateral downsloping or undersurface spurring. Glenohumeral Joint: No degenerative changes or joint effusion. There is thickening  of the capsular structures in the axillary recess which can be seen with adhesive capsulitis or synovitis. Labrum:  No obvious labral tears. Bones:  No acute bony findings. Other: Mild subacromial/subdeltoid bursitis.  IMPRESSION: 1. Mild to moderate rotator cuff tendinopathy/tendinosis but no partial or full-thickness tear. 2. Intact long head biceps tendon and glenoid labrum. 3. Mild edema like signal abnormality in the supraspinatus muscle could be due to a muscle strain, partial tear or denervation process. 4. No significant findings for bony impingement. 5. Mild subacromial/subdeltoid bursitis.   Chronic Neck Pain With History of Cervical Spinal Surgery  Chronic hip pain (2ry area of Pain) (Bilateral) (L>R)  Chronic low back pain (1ry area of Pain) (Bilateral) (L>R) w/o sciatica  Chronic shoulder pain (3ry area of Pain) (Bilateral) (L>R)  Chronic anticoagulation (Eliquis)   Eliquis  anticoagulation: (Stop: 3 days  Restart: 6 hours)   Multiple Allergies  Constipation Due to Opioid Therapy    Plan of Care  Problem-specific:  No problem-specific Assessment & Plan notes found for this encounter.  Ms. YARETHZI BRANAN has a current medication list which includes the following long-term medication(s): amiodarone, amlodipine, apixaban, linaclotide, montelukast, morphine, pantoprazole, [START ON 05/21/2020] morphine, and [START ON 06/20/2020] morphine.  Pharmacotherapy (Medications Ordered): Meds ordered this encounter  Medications  . morphine (MSIR) 15 MG tablet    Sig: Take 1 tablet (15 mg total) by mouth every 6 (six) hours as needed for moderate pain or severe pain. Must last 30 days.    Dispense:  120 tablet    Refill:  0    Chronic Pain: STOP Act (Not applicable) Fill 1 day early if closed on refill date. Avoid benzodiazepines within 8 hours of opioids  . morphine (MSIR) 15 MG tablet    Sig: Take 1 tablet (15 mg total) by mouth every 6 (six) hours as needed for moderate pain or severe pain. Must last 30 days.    Dispense:  120 tablet    Refill:  0    Chronic Pain: STOP Act (Not applicable) Fill 1 day early if closed on refill date. Avoid benzodiazepines within 8 hours of opioids   Orders:  Orders Placed This Encounter  Procedures  . Radiofrequency Shoulder Joint    For shoulder pain.    Standing Status:   Future    Standing Expiration Date:   04/21/2021    Scheduling Instructions:     Procedure: Suprascapular Nerve Radiofrequency Ablation     Laterality: Left-sided     Level(s): Suprascapular nerve     Sedation: With Sedation     Scheduling Timeframe: As soon as pre-approved    Order Specific Question:   Where will this procedure be performed?    Answer:   [facility]  . MR CERVICAL SPINE WO CONTRAST    Patient presents with axial pain with possible radicular component.  In addition to any acute findings, please report on:  1. Facet  (Zygapophyseal) joint DJD (Hypertrophy, space narrowing, subchondral sclerosis, and/or osteophyte formation) 2. DDD and/or IVDD (Loss of disc height, desiccation or "Black disc disease") 3. Pars defects 4. Spondylolisthesis, spondylosis, and/or spondyloarthropathies (include Degree/Grade of displacement in mm) 5. Vertebral body Fractures, including age (old, new/acute) 9. Modic Type Changes 7. Demineralization 8. Bone pathology 9. Central, Lateral Recess, and/or Foraminal Stenosis (include AP diameter of stenosis in mm) 10. Surgical changes (hardware type, status, and presence of fibrosis) NOTE: Please specify level(s) and laterality.    Standing Status:   Future  Standing Expiration Date:   05/19/2020    Scheduling Instructions:     Imaging must be done as soon as possible. Inform patient that order will expire within 30 days and I will not renew it.    Order Specific Question:   What is the patient's sedation requirement?    Answer:   No Sedation    Order Specific Question:   Does the patient have a pacemaker or implanted devices?    Answer:   No    Order Specific Question:   Preferred imaging location?    Answer:   [facility] (table limit-350lbs)    Order Specific Question:   Call Results- Best Contact Number?    Answer:   [phone] ([facility])    Order Specific Question:   Radiology Contrast Protocol - do NOT remove file path    Answer:   [email or web address]  . Blood Thinner Instructions to Nursing    Always make sure patient has clearance from prescribing physician to stop blood thinners for interventional therapies. If the patient requires a Lovenox-bridge therapy, make sure arrangements are made to institute it with the assistance of the PCP.    Scheduling Instructions:     Have Ms. Minotti stop the Eliquis (Apixaban) x 3 days prior to procedure or surgery.   Follow-up plan:   Return for RFA (5mn): (L) suprascapular nerve RFA #2.      Interventional Therapies  Risk  Complexity Considerations:   1. SHELLFISH ALLERGY; Dexamethasone & Prednisone allergy.  (NO STEROIDS)  2. NOT a candidate for lumbar facet RFA (due to hardware). 3. NOT a candidate for membrane stabilizers (Neurontin/Lyrica) due to prior failed trials. 4. NOT a candidate for SCS (due to HARDWARE). 5. NOT a candidate for IT Pump (patient's choice due to surgical risks).     Planned  Pending:   Pending further evaluation   Under consideration:   Possiblerightsuprascapular nerve RFA #1   Completed:   Diagnostic bilateral lumbar facet block x2 (01/14/2019) Palliativebilateral suprascapular NB x2(No Steroids) (06/19/2016) Palliative leftsuprascapular nerve RFA x1(01/04/2017)   Therapeutic  Palliative (PRN) options:   Diagnostic bilateral lumbar facet block #2  Palliativebilateral suprascapular NB #3(No Steroids) Palliative leftsuprascapular nerve RFA #2    Recent Visits Date Type Provider Dept  02/09/20 Office Visit NMilinda Pointer MD [facility]  Showing recent visits within past 90 days and meeting all other requirements Today's Visits Date Type Provider Dept  04/21/20 Office Visit NMilinda Pointer MD [facility]  Showing today's visits and meeting all other requirements Future Appointments Date Type Provider Dept  05/13/20 Appointment NMilinda Pointer MD [facility]  07/19/20 Appointment NMilinda Pointer MD [facility]  Showing future appointments within next 90 days and meeting all other requirements  I discussed the assessment and treatment plan with the patient. The patient was provided an opportunity to ask questions and all were answered. The patient agreed with the plan and demonstrated an understanding of the instructions.  Patient advised to call back or seek an in-person evaluation if the symptoms or condition worsens.  Duration of encounter: 30 minutes.  Note by:  FGaspar Cola MD Date: 04/21/2020; Time: 11:25 AM

## 2020-04-19 NOTE — Patient Instructions (Signed)
Please call if you have any questions or concerns.  °

## 2020-04-19 NOTE — Progress Notes (Signed)
[name] is a 60 year old female status post dictation.  He is doing very well now.  No reflux.  She did have some spasms that responded to Flexeril Fevers no chills.  She is taking p.o.   PE  NAD Abd: Soft nontender incisions healed without infection.    A/p doing very well without complications RTC prn

## 2020-04-20 NOTE — Telephone Encounter (Signed)
Please advise 

## 2020-04-21 ENCOUNTER — Encounter: Payer: Self-pay | Admitting: Pain Medicine

## 2020-04-21 ENCOUNTER — Ambulatory Visit: Payer: Medicare Other | Attending: Pain Medicine | Admitting: Pain Medicine

## 2020-04-21 VITALS — BP 137/85 | HR 65 | Temp 96.9°F | Ht 64.0 in | Wt 140.0 lb

## 2020-04-21 DIAGNOSIS — Z889 Allergy status to unspecified drugs, medicaments and biological substances status: Secondary | ICD-10-CM | POA: Insufficient documentation

## 2020-04-21 DIAGNOSIS — M25512 Pain in left shoulder: Secondary | ICD-10-CM | POA: Diagnosis not present

## 2020-04-21 DIAGNOSIS — M5412 Radiculopathy, cervical region: Secondary | ICD-10-CM | POA: Insufficient documentation

## 2020-04-21 DIAGNOSIS — M19012 Primary osteoarthritis, left shoulder: Secondary | ICD-10-CM | POA: Diagnosis not present

## 2020-04-21 DIAGNOSIS — G894 Chronic pain syndrome: Secondary | ICD-10-CM | POA: Insufficient documentation

## 2020-04-21 DIAGNOSIS — Z7901 Long term (current) use of anticoagulants: Secondary | ICD-10-CM | POA: Diagnosis not present

## 2020-04-21 DIAGNOSIS — Z9889 Other specified postprocedural states: Secondary | ICD-10-CM | POA: Insufficient documentation

## 2020-04-21 DIAGNOSIS — R936 Abnormal findings on diagnostic imaging of limbs: Secondary | ICD-10-CM | POA: Diagnosis not present

## 2020-04-21 DIAGNOSIS — G8928 Other chronic postprocedural pain: Secondary | ICD-10-CM | POA: Diagnosis not present

## 2020-04-21 DIAGNOSIS — M542 Cervicalgia: Secondary | ICD-10-CM | POA: Diagnosis not present

## 2020-04-21 DIAGNOSIS — Z5181 Encounter for therapeutic drug level monitoring: Secondary | ICD-10-CM | POA: Insufficient documentation

## 2020-04-21 MED ORDER — MORPHINE SULFATE 15 MG PO TABS
15.0000 mg | ORAL_TABLET | Freq: Four times a day (QID) | ORAL | 0 refills | Status: DC | PRN
Start: 2020-06-20 — End: 2020-08-18

## 2020-04-21 MED ORDER — MORPHINE SULFATE 15 MG PO TABS
15.0000 mg | ORAL_TABLET | Freq: Four times a day (QID) | ORAL | 0 refills | Status: DC | PRN
Start: 2020-05-21 — End: 2020-06-24

## 2020-04-21 NOTE — Progress Notes (Signed)
Safety precautions to be maintained throughout the outpatient stay will include: orient to surroundings, keep bed in low position, maintain call bell within reach at all times, provide assistance with transfer out of bed and ambulation.  

## 2020-04-21 NOTE — Telephone Encounter (Signed)
Faxed on 04/16/20. TNP

## 2020-04-21 NOTE — Patient Instructions (Signed)
____________________________________________________________________________________________  Preparing for Procedure with Sedation  Procedure appointments are limited to planned procedures: . No Prescription Refills. . No disability issues will be discussed. . No medication changes will be discussed.  Instructions: . Oral Intake: Do not eat or drink anything for at least 8 hours prior to your procedure. (Exception: Blood Pressure Medication. See below.) . Transportation: Unless otherwise stated by your physician, you may drive yourself after the procedure. . Blood Pressure Medicine: Do not forget to take your blood pressure medicine with a sip of water the morning of the procedure. If your Diastolic (lower reading)is above 100 mmHg, elective cases will be cancelled/rescheduled. . Blood thinners: These will need to be stopped for procedures. Notify our staff if you are taking any blood thinners. Depending on which one you take, there will be specific instructions on how and when to stop it. . Diabetics on insulin: Notify the staff so that you can be scheduled 1st case in the morning. If your diabetes requires high dose insulin, take only  of your normal insulin dose the morning of the procedure and notify the staff that you have done so. . Preventing infections: Shower with an antibacterial soap the morning of your procedure. . Build-up your immune system: Take 1000 mg of Vitamin C with every meal (3 times a day) the day prior to your procedure. . Antibiotics: Inform the staff if you have a condition or reason that requires you to take antibiotics before dental procedures. . Pregnancy: If you are pregnant, call and cancel the procedure. . Sickness: If you have a cold, fever, or any active infections, call and cancel the procedure. . Arrival: You must be in the facility at least 30 minutes prior to your scheduled procedure. . Children: Do not bring children with you. . Dress appropriately:  Bring dark clothing that you would not mind if they get stained. . Valuables: Do not bring any jewelry or valuables.  Reasons to call and reschedule or cancel your procedure: (Following these recommendations will minimize the risk of a serious complication.) . Surgeries: Avoid having procedures within 2 weeks of any surgery. (Avoid for 2 weeks before or after any surgery). . Flu Shots: Avoid having procedures within 2 weeks of a flu shots or . (Avoid for 2 weeks before or after immunizations). . Barium: Avoid having a procedure within 7-10 days after having had a radiological study involving the use of radiological contrast. (Myelograms, Barium swallow or enema study). . Heart attacks: Avoid any elective procedures or surgeries for the initial 6 months after a "Myocardial Infarction" (Heart Attack). . Blood thinners: It is imperative that you stop these medications before procedures. Let us know if you if you take any blood thinner.  . Infection: Avoid procedures during or within two weeks of an infection (including chest colds or gastrointestinal problems). Symptoms associated with infections include: Localized redness, fever, chills, night sweats or profuse sweating, burning sensation when voiding, cough, congestion, stuffiness, runny nose, sore throat, diarrhea, nausea, vomiting, cold or Flu symptoms, recent or current infections. It is specially important if the infection is over the area that we intend to treat. . Heart and lung problems: Symptoms that may suggest an active cardiopulmonary problem include: cough, chest pain, breathing difficulties or shortness of breath, dizziness, ankle swelling, uncontrolled high or unusually low blood pressure, and/or palpitations. If you are experiencing any of these symptoms, cancel your procedure and contact your primary care physician for an evaluation.  Remember:  Regular Business hours are:    Monday to Thursday 8:00 AM to 4:00 PM  Provider's  Schedule: Shaneen Reeser, MD:  Procedure days: Tuesday and Thursday 7:30 AM to 4:00 PM  Bilal Lateef, MD:  Procedure days: Monday and Wednesday 7:30 AM to 4:00 PM ____________________________________________________________________________________________   ____________________________________________________________________________________________  General Risks and Possible Complications  Patient Responsibilities: It is important that you read this as it is part of your informed consent. It is our duty to inform you of the risks and possible complications associated with treatments offered to you. It is your responsibility as a patient to read this and to ask questions about anything that is not clear or that you believe was not covered in this document.  Patient's Rights: You have the right to refuse treatment. You also have the right to change your mind, even after initially having agreed to have the treatment done. However, under this last option, if you wait until the last second to change your mind, you may be charged for the materials used up to that point.  Introduction: Medicine is not an exact science. Everything in Medicine, including the lack of treatment(s), carries the potential for danger, harm, or loss (which is by definition: Risk). In Medicine, a complication is a secondary problem, condition, or disease that can aggravate an already existing one. All treatments carry the risk of possible complications. The fact that a side effects or complications occurs, does not imply that the treatment was conducted incorrectly. It must be clearly understood that these can happen even when everything is done following the highest safety standards.  No treatment: You can choose not to proceed with the proposed treatment alternative. The "PRO(s)" would include: avoiding the risk of complications associated with the therapy. The "CON(s)" would include: not getting any of the treatment  benefits. These benefits fall under one of three categories: diagnostic; therapeutic; and/or palliative. Diagnostic benefits include: getting information which can ultimately lead to improvement of the disease or symptom(s). Therapeutic benefits are those associated with the successful treatment of the disease. Finally, palliative benefits are those related to the decrease of the primary symptoms, without necessarily curing the condition (example: decreasing the pain from a flare-up of a chronic condition, such as incurable terminal cancer).  General Risks and Complications: These are associated to most interventional treatments. They can occur alone, or in combination. They fall under one of the following six (6) categories: no benefit or worsening of symptoms; bleeding; infection; nerve damage; allergic reactions; and/or death. 1. No benefits or worsening of symptoms: In Medicine there are no guarantees, only probabilities. No healthcare provider can ever guarantee that a medical treatment will work, they can only state the probability that it may. Furthermore, there is always the possibility that the condition may worsen, either directly, or indirectly, as a consequence of the treatment. 2. Bleeding: This is more common if the patient is taking a blood thinner, either prescription or over the counter (example: Goody Powders, Fish oil, Aspirin, Garlic, etc.), or if suffering a condition associated with impaired coagulation (example: Hemophilia, cirrhosis of the liver, low platelet counts, etc.). However, even if you do not have one on these, it can still happen. If you have any of these conditions, or take one of these drugs, make sure to notify your treating physician. 3. Infection: This is more common in patients with a compromised immune system, either due to disease (example: diabetes, cancer, human immunodeficiency virus [HIV], etc.), or due to medications or treatments (example: therapies used to treat  cancer and   rheumatological diseases). However, even if you do not have one on these, it can still happen. If you have any of these conditions, or take one of these drugs, make sure to notify your treating physician. 4. Nerve Damage: This is more common when the treatment is an invasive one, but it can also happen with the use of medications, such as those used in the treatment of cancer. The damage can occur to small secondary nerves, or to large primary ones, such as those in the spinal cord and brain. This damage may be temporary or permanent and it may lead to impairments that can range from temporary numbness to permanent paralysis and/or brain death. 5. Allergic Reactions: Any time a substance or material comes in contact with our body, there is the possibility of an allergic reaction. These can range from a mild skin rash (contact dermatitis) to a severe systemic reaction (anaphylactic reaction), which can result in death. 6. Death: In general, any medical intervention can result in death, most of the time due to an unforeseen complication. ____________________________________________________________________________________________   

## 2020-04-22 ENCOUNTER — Telehealth: Payer: Self-pay | Admitting: Cardiology

## 2020-04-22 ENCOUNTER — Ambulatory Visit
Admission: RE | Admit: 2020-04-22 | Discharge: 2020-04-22 | Disposition: A | Discharge status: Home / Self Care | Payer: Medicare Other | Source: Ambulatory Visit | Attending: Family Medicine | Admitting: Family Medicine

## 2020-04-22 ENCOUNTER — Other Ambulatory Visit: Payer: Self-pay

## 2020-04-22 DIAGNOSIS — Z1231 Encounter for screening mammogram for malignant neoplasm of breast: Secondary | ICD-10-CM | POA: Insufficient documentation

## 2020-04-22 NOTE — Telephone Encounter (Signed)
Pt c/o of Chest Pain: STAT if CP now or developed within 24 hours  1. Are you having CP right now? Intermittent 4-5 days    2. Are you experiencing any other symptoms (ex. SOB, nausea, vomiting, sweating)? Sob nausea  tired  Hx afib   3. How long have you been experiencing CP?  4-5 days   4. Is your CP continuous or coming and going? Comes and goes   5. Have you taken Nitroglycerin? No   Patient scheduled 2/15 Agbor Etang . ?

## 2020-04-22 NOTE — Telephone Encounter (Signed)
Called and spoke with patient. She stated that she is feeling like she did prior to her cardioversion. Her Chest is sore and she has been feeling SOB. Her most recent BP was 158/108, HR 98. She states the BP is sometimes WNL and then sometimes elevated.   I scheduled her to come in tomorrow to see Dr. Garen Lah. Patient was very grateful for the phone call back and confirmed she will be here at 1520 tomorrow.

## 2020-04-23 ENCOUNTER — Ambulatory Visit (INDEPENDENT_AMBULATORY_CARE_PROVIDER_SITE_OTHER): Payer: Medicare Other

## 2020-04-23 ENCOUNTER — Ambulatory Visit: Payer: Medicare Other | Admitting: Cardiology

## 2020-04-23 ENCOUNTER — Encounter: Payer: Self-pay | Admitting: Cardiology

## 2020-04-23 VITALS — BP 130/84 | HR 108 | Ht 64.0 in | Wt 139.0 lb

## 2020-04-23 DIAGNOSIS — I10 Essential (primary) hypertension: Secondary | ICD-10-CM | POA: Diagnosis not present

## 2020-04-23 DIAGNOSIS — R55 Syncope and collapse: Secondary | ICD-10-CM | POA: Diagnosis not present

## 2020-04-23 DIAGNOSIS — I4892 Unspecified atrial flutter: Secondary | ICD-10-CM

## 2020-04-23 MED ORDER — DILTIAZEM HCL ER COATED BEADS 120 MG PO CP24: 120.0000 mg | ORAL_CAPSULE | Freq: Every day | ORAL | 3 refills | Status: DC

## 2020-04-23 NOTE — Progress Notes (Signed)
Cardiology Office Note:    Date:  04/23/2020   ID:  [name], DOB 1960-12-05, MRN [id number]  PCP:  Virginia Crews, MD  Cardiologist:  Kate Sable, MD  Electrophysiologist:  None   Referring MD: Virginia Crews, MD   Chief Complaint  Patient presents with  . Other    Patient c.o increased SOB, Chest pain, and Palpitations. Meds reviewed verbally with patient.     History of Present Illness:    [name] is a 60 y.o. female with a hx of hypertension, paroxysmal atrial fibrillation/atrial flutter s/p DCCV  12/03/2019 on Eliquis, asthma, anxiety, GERD, presents for follow-up.    Patient is being seen for A. fib/atrial flutter.  Maintaining sinus rhythm since cardioversion on 11/2019.  She has been asymptomatic since starting amiodarone.  Over the past several weeks she has had episodes of palpitations and elevated heart rates.  Symptoms are associated with shortness of breath.  Prior notes She was initially seen due to history of palpitations. Holter monitor was placed and showed atrial fibrillation 3% burden.  Eliquis and Toprol-XL was started.   Echocardiogram  On 06/2019 showed normal systolic function, EF 55 to 60%, mild LA dilatation.   Left heart cath on 05/2019 did not show any significant CAD.   Past Medical History:  Diagnosis Date  . A-fib ([place]) 2021  . Abnormal EKG    HX OF INVERTED T WAVES ON EKG, PALPITATIONS, CHEST PAINS-CARDIAC WORK UP DID NOT SHOW ANY HEART DISEASE  . AC (acromioclavicular) joint bone spurs   . Acute postoperative pain 01/04/2017  . 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 Anemia    Iron Infusion-8 yrs ago  . Anxiety   . Asthma   . Cephalalgia 08/18/2014  . Cervical disc disease 08/18/2014   Needs neck surgery.   . Chronic headaches   . Chronic, continuous use of opioids   . DDD (degenerative disc disease)    CERVICAL AND LUMBAR-CHRONIC PAIN, RT HIP LABRAL TEAR  . Depression    PT STATES A LOT OF STRESS IN HER LIFE  .  Dissociative disorder   . Dizziness 04/22/2013  . Duodenal ulcer with hemorrhage and perforation ([place]) 04/27/2003  . Foot drop, right    FROM BACK SURGERY  . GERD (gastroesophageal reflux disease)   . H/O arthrodesis 08/18/2014  . Headache(784.0)    AND NECK PAIN--STATES RECENT TEST SHOW CERVICAL DEGENERATION  . History of blood transfusion    s/p back surgery  . History of cardioversion   . History of cervical spinal surgery 01/04/2015  . History of kidney stones   . Hypertension   . Inverted T wave   . Narrowing of intervertebral disc space 08/18/2014   Currently on disability.   . Orthostatic hypotension 04/22/2013  . Pain    CHRONIC NECK AND BACK PAIN - LIMITED ROM NECK - S/P FUSIONS CERVICAL AND LUMBAR  . Pneumonia   . PONV (postoperative nausea and vomiting)    PT GIVES HX OF N&V AND FEVER WITH SURGERIES YEARS AGO--BUT NO PROBLEMS WITH MORE RECENT SURGERIES--STATES NOT MALIGNANT HYPERTHERMIA  . Postop Hyponatremia 05/14/2012  . Postoperative anemia due to acute blood loss 05/14/2012  . PTSD (post-traumatic stress disorder)   . Right foot drop   . 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   . Sleep apnea 2021   does not have a cpap  . Therapeutic opioid-induced constipation (OIC)     Past  Surgical History:  Procedure Laterality Date  . ABDOMINAL HYSTERECTOMY    . afib  05/2019  . ANTERIOR CERVICAL DECOMP/DISCECTOMY FUSION N/A 10/30/2012   Procedure: ACDF C5-6, EXPLORATION AND HARDWARE REMOVAL C6-7;  Surgeon: Melina Schools, MD;  Location: [place];  Service: Orthopedics;  Laterality: N/A;  . ANTERIOR FUSION CERVICAL SPINE  MAY 2012   AT [facility]  . BACK SURGERY  2009   LUMBAR FUSION WITH RODS   . BREAST BIOPSY Right 2008   benign.- bx/clip  . BREAST EXCISIONAL BIOPSY Left 1998   benign  . BREAST REDUCTION SURGERY Bilateral 06/2016  . CARDIOVERSION N/A 12/03/2019   Procedure: CARDIOVERSION;  Surgeon: Kate Sable, MD;  Location: [facility]  [facility];  Service: Cardiovascular;  Laterality: N/A;  . CARPAL TUNNEL RELEASE  05-06-12   Right  . CHOLECYSTECTOMY    . COLONOSCOPY WITH PROPOFOL N/A 01/26/2017   Procedure: COLONOSCOPY WITH PROPOFOL;  Surgeon: Lucilla Lame, MD;  Location: [place];  Service: Endoscopy;  Laterality: N/A;  . DIAGNOSTIC LAPAROSCOPIES - MULTIPLE FOR ENDOMETRIOSIS    . ESOPHAGEAL DILATION N/A 01/26/2017   Procedure: ESOPHAGEAL DILATION;  Surgeon: Lucilla Lame, MD;  Location: [place];  Service: Endoscopy;  Laterality: N/A;  . ESOPHAGEAL MANOMETRY N/A 05/30/2017   Procedure: ESOPHAGEAL MANOMETRY (EM);  Surgeon: Lucilla Lame, MD;  Location: [facility];  Service: Endoscopy;  Laterality: N/A;  . ESOPHAGOGASTRODUODENOSCOPY (EGD) WITH PROPOFOL N/A 01/26/2017   Procedure: ESOPHAGOGASTRODUODENOSCOPY (EGD) WITH PROPOFOL;  Surgeon: Lucilla Lame, MD;  Location: [place];  Service: Endoscopy;  Laterality: N/A;  . ESOPHAGOGASTRODUODENOSCOPY (EGD) WITH PROPOFOL N/A 01/30/2020   Procedure: ESOPHAGOGASTRODUODENOSCOPY (EGD) WITH PROPOFOL;  Surgeon: Lucilla Lame, MD;  Location: [place];  Service: Endoscopy;  Laterality: N/A;  sleep apnea COVID + 01-15-20  . HIP ARTHROSCOPY  09/20/2011   Procedure: ARTHROSCOPY HIP;  Surgeon: Gearlean Alf, MD;  [facility];  Service: Orthopedics;  Laterality: Right;  Right Hip Scope with Labral Debridement  . LEFT HEART CATH AND CORONARY ANGIOGRAPHY Left 06/10/2019   Procedure: LEFT HEART CATH AND CORONARY ANGIOGRAPHY;  Surgeon: Nelva Bush, MD;  Location: [place] [facility];  Service: Cardiovascular;  Laterality: Left;  . NASAL SEPTUM SURGERY  MARCH 2013   IN [place]  . [place] IMPEDANCE STUDY N/A 05/30/2017   Procedure: [place] IMPEDANCE STUDY;  Surgeon: Lucilla Lame, MD;  Location: [facility];  Service: Endoscopy;  Laterality: N/A;  . POLYPECTOMY N/A 01/26/2017   Procedure: POLYPECTOMY;  Surgeon: Lucilla Lame, MD;  Location: [place];  Service: Endoscopy;  Laterality: N/A;  . POSTERIOR CERVICAL FUSION/FORAMINOTOMY N/A 04/16/2013   Procedure: REMOVAL CERVICAL PLATES AND INTERBODY CAGE/POSTERIOR CERVICAL SPINAL FUSION C4 - C6/C5 CORPECTOMY/C4 - C6 FUSION WITH ILIAC CREST BONE GRAFT;  Surgeon: Melina Schools, MD;  Location: [place];  Service: Orthopedics;  Laterality: N/A;  . RADIOFREQUENCY ABLATION NERVES    . REDUCTION MAMMAPLASTY Bilateral 05/2016  . RIGHT HIP ARTHROSCOPY FOR LABRAL TEAR  ABOUT 2010   2012 also  . SHOULDER ARTHROSCOPY  05-06-12   bone spur  . TONSILLECTOMY     AS A CHILD  . TOTAL HIP ARTHROPLASTY Right 05/13/2012   Procedure: TOTAL HIP ARTHROPLASTY ANTERIOR APPROACH;  Surgeon: Gearlean Alf, MD;  [facility];  Service: Orthopedics;  Laterality: Right;    Current Medications: Current Meds  Medication Sig  . acetaminophen (TYLENOL) 500 MG tablet Take 500-1,000 mg by mouth every 6 (six) hours as needed for moderate pain.   Marland Kitchen ALPRAZolam (  XANAX) 1 MG tablet Take 1 mg by mouth at bedtime.   Marland Kitchen amiodarone (PACERONE) 200 MG tablet Take 1 tablet (200 mg total) by mouth daily.  Marland Kitchen amLODipine (NORVASC) 5 MG tablet Take 1 tablet (5 mg total) by mouth daily.  Marland Kitchen apixaban (ELIQUIS) 5 MG TABS tablet Take 1 tablet (5 mg total) by mouth 2 (two) times daily.  Marland Kitchen diltiazem (CARDIZEM CD) 120 MG 24 hr capsule Take 1 capsule (120 mg total) by mouth daily.  Marland Kitchen linaclotide (LINZESS) 290 MCG CAPS capsule Take 1 capsule (290 mcg total) by mouth daily before breakfast.  . Menthol, Topical Analgesic, (BENGAY EX) Apply 1 application topically daily as needed (pain).  . montelukast (SINGULAIR) 10 MG tablet TAKE 1 TABLET BY MOUTH EVERY DAY  . morphine (MSIR) 15 MG tablet Take 1 tablet (15 mg total) by mouth every 6 (six) hours as needed for moderate pain or severe pain. Must last 30 days.  Derrill Memo ON 05/21/2020] morphine (MSIR) 15 MG tablet Take 1 tablet (15 mg total) by mouth every 6 (six) hours as needed for moderate pain or  severe pain. Must last 30 days.  Derrill Memo ON 06/20/2020] morphine (MSIR) 15 MG tablet Take 1 tablet (15 mg total) by mouth every 6 (six) hours as needed for moderate pain or severe pain. Must last 30 days.  . ondansetron (ZOFRAN ODT) 4 MG disintegrating tablet Take 1 tablet (4 mg total) by mouth every 8 (eight) hours as needed for nausea or vomiting.  . pantoprazole (PROTONIX) 40 MG tablet TAKE 1 TABLET BY MOUTH TWICE A DAY  . polyethylene glycol (MIRALAX / GLYCOLAX) 17 g packet Take 17 g by mouth daily as needed.  . valACYclovir (VALTREX) 1000 MG tablet TAKE TWO TABLETS BY MOUTH TWICE DAILY FOR ONE DAY FEVER BLISTER     Allergies:   Amoxicillin, Chlorhexidine gluconate, Clindamycin, Codeine, Erythromycin, Penicillin g, Sulfa antibiotics, Levofloxacin, Shellfish allergy, Decadron [dexamethasone], Mangifera indica, Papaya derivatives, Betadine [povidone iodine], Clarithromycin, Other, Povidone-iodine, and Prednisone   Social History   Socioeconomic History  . Marital status: Married    Spouse name: bruce  . Number of children: 2  . Years of education: Not on file  . Highest education level: Associate degree: occupational, Hotel manager, or vocational program  Occupational History    Comment: disabled  Tobacco Use  . Smoking status: Never Smoker  . Smokeless tobacco: Never Used  Vaping Use  . Vaping Use: Never used  Substance and Sexual Activity  . Alcohol use: No    Alcohol/week: 0.0 standard drinks  . Drug use: No  . Sexual activity: Yes  Other Topics Concern  . Not on file  Social History Narrative   Son, daughter  And 4 grandkids; raises 3 of the grandchildren   Social Determinants of Health   Financial Resource Strain: High Risk  . Difficulty of Paying Living Expenses: Very hard  Food Insecurity: Food Insecurity Present  . Worried About Charity fundraiser in the Last Year: Sometimes true  . Ran Out of Food in the Last Year: Often true  Transportation Needs: Unmet  Transportation Needs  . Lack of Transportation (Medical): Yes  . Lack of Transportation (Non-Medical): Yes  Physical Activity: Inactive  . Days of Exercise per Week: 0 days  . Minutes of Exercise per Session: 0 min  Stress: Stress Concern Present  . Feeling of Stress : Very much  Social Connections: Moderately Isolated  . Frequency of Communication with Friends and Family: More than  three times a week  . Frequency of Social Gatherings with Friends and Family: Never  . Attends Religious Services: Never  . Active Member of Clubs or Organizations: No  . Attends Archivist Meetings: Never  . Marital Status: Married     Family History: The patient's family history includes Aneurysm in her maternal grandmother and mother; Anxiety disorder in her grandchild; Bipolar disorder in her grandchild and sister; Breast cancer (age of onset: 35) in her paternal grandmother; Depression in her grandchild.  ROS:   Please see the history of present illness.     All other systems reviewed and are negative.  EKGs/Labs/Other Studies Reviewed:    The following studies were reviewed today:   EKG:  EKG is  ordered today.  The ekg ordered today demonstrates sinus rhythm, PACs, heart rate 108.  Recent Labs: 07/10/2019: B Natriuretic Peptide 395.0; TSH 1.424 01/13/2020: ALT 8 02/27/2020: BUN 11; Creatinine, Ser 0.89; Hemoglobin 10.2; Platelets 279; Potassium 4.2; Sodium 138  Recent Lipid Panel    Component Value Date/Time   CHOL 183 01/13/2020 1117   TRIG 116 01/13/2020 1117   HDL 64 01/13/2020 1117   CHOLHDL 2.9 01/13/2020 1117   LDLCALC 99 01/13/2020 1117    Physical Exam:    VS:  BP 130/84 (BP Location: Left Arm, Patient Position: Sitting, Cuff Size: Normal)   Pulse (!) 108   Ht 5\' 4"  (1.626 m)   Wt 139 lb (63 kg)   SpO2 97%   BMI 23.86 kg/m     Wt Readings from Last 3 Encounters:  04/23/20 139 lb (63 kg)  04/21/20 140 lb (63.5 kg)  04/19/20 143 lb 6.4 oz (65 kg)     GEN:   Well nourished, mild distress HEENT: Normal NECK: No JVD; No carotid bruits LYMPHATICS: No lymphadenopathy CARDIAC: Tachycardic, no murmurs, rubs, gallops RESPIRATORY:  Clear to auscultation without rales, wheezing or rhonchi  ABDOMEN: Soft, non-tender, non-distended MUSCULOSKELETAL:  No edema; No deformity  SKIN: Warm and dry NEUROLOGIC:  Alert and oriented x 3 PSYCHIATRIC:  Normal affect   ASSESSMENT:    1. Atrial flutter, unspecified type ([place])   2. Essential hypertension   3. Syncope and collapse    PLAN:    In order of problems listed above:  1. Patient with history of atrial flutter, status post cardioversion, 12/03/2019 maintaining sinus rhythm.  CHA2DS2-VASc score 2 (gender, htn). continue Eliquis 5 mg twice daily, amiodarone to 200 mg daily.  Last echo has normal EF 55 to 60%, mild LA dilation.  Place cardiac monitor x2 weeks to evaluate recurrence of A. fib/flutter due to frequent palpitations.  Anxiety might be contributing.  Start Cardizem 120 mg daily. 2. History of hypertension, BP controlled.  Stop amlodipine, start Cardizem 120 mg daily. 3. History of syncope, evaluate for significant arrhythmias with cardiac monitor as above.  Follow-up in 6 weeks.  Total encounter time 40 minutes  Greater than 50% was spent in counseling and coordination of care with the patient   This note was generated in part or whole with voice recognition software. Voice recognition is usually quite accurate but there are transcription errors that can and very often do occur. I apologize for any typographical errors that were not detected and corrected.  Medication Adjustments/Labs and Tests Ordered: Current medicines are reviewed at length with the patient today.  Concerns regarding medicines are outlined above.  Orders Placed This Encounter  Procedures  . LONG TERM MONITOR (3-14 DAYS)  . EKG 12-Lead  Meds ordered this encounter  Medications  . diltiazem (CARDIZEM CD) 120 MG 24 hr  capsule    Sig: Take 1 capsule (120 mg total) by mouth daily.    Dispense:  30 capsule    Refill:  3    Patient Instructions  Medication Instructions:  Your physician has recommended you make the following change in your medication:   1.  STOP taking your Amlodipine (Norvasc). 2.  START taking Cardizem CD (Diltiazem) 120 MG: Take one tab by mouth once a day.  *If you need a refill on your cardiac medications before your next appointment, please call your pharmacy*   Lab Work: None ordered If you have labs (blood work) drawn today and your tests are completely normal, you will receive your results only by: Marland Kitchen MyChart Message (if you have MyChart) OR . A paper copy in the mail If you have any lab test that is abnormal or we need to change your treatment, we will call you to review the results.   Testing/Procedures:  Your physician has recommended that you wear a Zio monitor for 2 weeks. This monitor is a medical device that records the heart's electrical activity. Doctors most often use these monitors to diagnose arrhythmias. Arrhythmias are problems with the speed or rhythm of the heartbeat. The monitor is a small device applied to your chest. You can wear one while you do your normal daily activities. While wearing this monitor if you have any symptoms to push the button and record what you felt. Once you have worn this monitor for the period of time provider prescribed (Usually 14 days), you will return the monitor device in the postage paid box. Once it is returned they will download the data collected and provide [place] with a report which the provider will then review and we will call you with those results. Important tips:  1. Avoid showering during the first 24 hours of wearing the monitor. 2. Avoid excessive sweating to help maximize wear time. 3. Do not submerge the device, no hot tubs, and no swimming pools. 4. Keep any lotions or oils away from the patch. 5. After 24 hours you  may shower with the patch on. Take brief showers with your back facing the shower head.  6. Do not remove patch once it has been placed because that will interrupt data and decrease adhesive wear time. 7. Push the button when you have any symptoms and write down what you were feeling. 8. Once you have completed wearing your monitor, remove and place into box which has postage paid and place in your outgoing mailbox.  9. If for some reason you have misplaced your box then call our office and we can provide another box and/or mail it off for you.         Follow-Up: At [facility], you and your health needs are our priority.  As part of our continuing mission to provide you with exceptional heart care, we have created designated Provider Care Teams.  These Care Teams include your primary Cardiologist (physician) and Advanced Practice Providers (APPs -  Physician Assistants and Nurse Practitioners) who all work together to provide you with the care you need, when you need it.  We recommend signing up for the patient portal called "MyChart".  Sign up information is provided on this After Visit Summary.  MyChart is used to connect with patients for Virtual Visits (Telemedicine).  Patients are able to view lab/test results, encounter notes, upcoming appointments, etc.  Non-urgent messages can be sent to your provider as well.   To learn more about what you can do with MyChart, go to [email or web address].    Your next appointment:   6 week(s)  The format for your next appointment:   In Person  Provider:   Kate Sable, MD   Other Instructions      Signed, Kate Sable, MD  04/23/2020 5:13 PM    [place]

## 2020-04-23 NOTE — Patient Instructions (Signed)
Medication Instructions:  Your physician has recommended you make the following change in your medication:   1.  STOP taking your Amlodipine (Norvasc). 2.  START taking Cardizem CD (Diltiazem) 120 MG: Take one tab by mouth once a day.  *If you need a refill on your cardiac medications before your next appointment, please call your pharmacy*   Lab Work: None ordered If you have labs (blood work) drawn today and your tests are completely normal, you will receive your results only by: Marland Kitchen MyChart Message (if you have MyChart) OR . A paper copy in the mail If you have any lab test that is abnormal or we need to change your treatment, we will call you to review the results.   Testing/Procedures:  Your physician has recommended that you wear a Zio monitor for 2 weeks. This monitor is a medical device that records the heart's electrical activity. Doctors most often use these monitors to diagnose arrhythmias. Arrhythmias are problems with the speed or rhythm of the heartbeat. The monitor is a small device applied to your chest. You can wear one while you do your normal daily activities. While wearing this monitor if you have any symptoms to push the button and record what you felt. Once you have worn this monitor for the period of time provider prescribed (Usually 14 days), you will return the monitor device in the postage paid box. Once it is returned they will download the data collected and provide [place] with a report which the provider will then review and we will call you with those results. Important tips:  1. Avoid showering during the first 24 hours of wearing the monitor. 2. Avoid excessive sweating to help maximize wear time. 3. Do not submerge the device, no hot tubs, and no swimming pools. 4. Keep any lotions or oils away from the patch. 5. After 24 hours you may shower with the patch on. Take brief showers with your back facing the shower head.  6. Do not remove patch once it has been  placed because that will interrupt data and decrease adhesive wear time. 7. Push the button when you have any symptoms and write down what you were feeling. 8. Once you have completed wearing your monitor, remove and place into box which has postage paid and place in your outgoing mailbox.  9. If for some reason you have misplaced your box then call our office and we can provide another box and/or mail it off for you.         Follow-Up: At [facility], you and your health needs are our priority.  As part of our continuing mission to provide you with exceptional heart care, we have created designated Provider Care Teams.  These Care Teams include your primary Cardiologist (physician) and Advanced Practice Providers (APPs -  Physician Assistants and Nurse Practitioners) who all work together to provide you with the care you need, when you need it.  We recommend signing up for the patient portal called "MyChart".  Sign up information is provided on this After Visit Summary.  MyChart is used to connect with patients for Virtual Visits (Telemedicine).  Patients are able to view lab/test results, encounter notes, upcoming appointments, etc.  Non-urgent messages can be sent to your provider as well.   To learn more about what you can do with MyChart, go to [email or web address].    Your next appointment:   6 week(s)  The format for your next appointment:   In Person  Provider:  Kate Sable, MD   Other Instructions

## 2020-04-26 ENCOUNTER — Telehealth: Payer: Self-pay

## 2020-04-26 DIAGNOSIS — G4733 Obstructive sleep apnea (adult) (pediatric): Secondary | ICD-10-CM | POA: Diagnosis not present

## 2020-04-26 NOTE — Telephone Encounter (Signed)
Attempted to call patient to clarify message that was sent back.  Left message to call office.

## 2020-04-26 NOTE — Telephone Encounter (Signed)
Spoke with pharmacist and she said that the prescription would be filled within the hour.  Patient notified.

## 2020-04-26 NOTE — Telephone Encounter (Signed)
Pt states that the Pharmacy is not letting her get her third prescription filled and she needs her medication before she runs out her last filled prescription 03/27/2020

## 2020-04-27 ENCOUNTER — Ambulatory Visit: Payer: Medicare Other | Admitting: Cardiology

## 2020-05-01 ENCOUNTER — Other Ambulatory Visit: Payer: Self-pay

## 2020-05-01 ENCOUNTER — Emergency Department: Payer: Medicare Other

## 2020-05-01 ENCOUNTER — Encounter: Payer: Self-pay | Admitting: Intensive Care

## 2020-05-01 ENCOUNTER — Emergency Department
Admission: EM | Admit: 2020-05-01 | Discharge: 2020-05-01 | Disposition: A | Discharge status: Home / Self Care | Payer: Medicare Other | Attending: Emergency Medicine | Admitting: Emergency Medicine

## 2020-05-01 DIAGNOSIS — I10 Essential (primary) hypertension: Secondary | ICD-10-CM | POA: Diagnosis not present

## 2020-05-01 DIAGNOSIS — Z7901 Long term (current) use of anticoagulants: Secondary | ICD-10-CM | POA: Insufficient documentation

## 2020-05-01 DIAGNOSIS — Z981 Arthrodesis status: Secondary | ICD-10-CM | POA: Diagnosis not present

## 2020-05-01 DIAGNOSIS — Z96641 Presence of right artificial hip joint: Secondary | ICD-10-CM | POA: Insufficient documentation

## 2020-05-01 DIAGNOSIS — N261 Atrophy of kidney (terminal): Secondary | ICD-10-CM | POA: Diagnosis not present

## 2020-05-01 DIAGNOSIS — J45909 Unspecified asthma, uncomplicated: Secondary | ICD-10-CM | POA: Insufficient documentation

## 2020-05-01 DIAGNOSIS — Z9049 Acquired absence of other specified parts of digestive tract: Secondary | ICD-10-CM | POA: Diagnosis not present

## 2020-05-01 DIAGNOSIS — Z955 Presence of coronary angioplasty implant and graft: Secondary | ICD-10-CM | POA: Insufficient documentation

## 2020-05-01 DIAGNOSIS — Z8616 Personal history of COVID-19: Secondary | ICD-10-CM | POA: Insufficient documentation

## 2020-05-01 DIAGNOSIS — N390 Urinary tract infection, site not specified: Secondary | ICD-10-CM

## 2020-05-01 DIAGNOSIS — R319 Hematuria, unspecified: Secondary | ICD-10-CM | POA: Diagnosis present

## 2020-05-01 DIAGNOSIS — Z79899 Other long term (current) drug therapy: Secondary | ICD-10-CM | POA: Diagnosis not present

## 2020-05-01 DIAGNOSIS — Z9889 Other specified postprocedural states: Secondary | ICD-10-CM | POA: Diagnosis not present

## 2020-05-01 DIAGNOSIS — R109 Unspecified abdominal pain: Secondary | ICD-10-CM | POA: Diagnosis not present

## 2020-05-01 LAB — COMPREHENSIVE METABOLIC PANEL
ALT: 13 U/L (ref 0–44)
AST: 20 U/L (ref 15–41)
Albumin: 4.5 g/dL (ref 3.5–5.0)
Alkaline Phosphatase: 98 U/L (ref 38–126)
Anion gap: 10 (ref 5–15)
BUN: 12 mg/dL (ref 6–20)
CO2: 23 mmol/L (ref 22–32)
Calcium: 9.5 mg/dL (ref 8.9–10.3)
Chloride: 100 mmol/L (ref 98–111)
Creatinine, Ser: 1.01 mg/dL — ABNORMAL HIGH (ref 0.44–1.00)
GFR, Estimated: >60 mL/min (ref >60)
Glucose, Bld: 162 mg/dL — ABNORMAL HIGH (ref 70–99)
Potassium: 3.5 mmol/L (ref 3.5–5.1)
Sodium: 133 mmol/L — ABNORMAL LOW (ref 135–145)
Total Bilirubin: 0.6 mg/dL (ref 0.3–1.2)
Total Protein: 7.8 g/dL (ref 6.5–8.1)

## 2020-05-01 LAB — CBC
HCT: 38.9 % (ref 36.0–46.0)
Hemoglobin: 12.4 g/dL (ref 12.0–15.0)
MCH: 26.4 pg (ref 26.0–34.0)
MCHC: 31.9 g/dL (ref 30.0–36.0)
MCV: 82.8 fL (ref 80.0–100.0)
Platelets: 409 10*3/uL — ABNORMAL HIGH (ref 150–400)
RBC: 4.7 MIL/uL (ref 3.87–5.11)
RDW: 13.6 % (ref 11.5–15.5)
WBC: 12.5 10*3/uL — ABNORMAL HIGH (ref 4.0–10.5)
nRBC: 0 % (ref 0.0–0.2)

## 2020-05-01 LAB — URINALYSIS, COMPLETE (UACMP) WITH MICROSCOPIC
Bilirubin Urine: NEGATIVE
Glucose, UA: NEGATIVE mg/dL
Ketones, ur: NEGATIVE mg/dL
Nitrite: NEGATIVE
Protein, ur: 100 mg/dL — AB
RBC / HPF: >50 RBC/hpf — ABNORMAL HIGH (ref 0–5)
Specific Gravity, Urine: 1.01 (ref 1.005–1.030)
WBC, UA: >50 WBC/hpf — ABNORMAL HIGH (ref 0–5)
pH: 5 (ref 5.0–8.0)

## 2020-05-01 LAB — LIPASE, BLOOD: Lipase: 28 U/L (ref 11–51)

## 2020-05-01 LAB — LACTIC ACID, PLASMA
Lactic Acid, Venous: 2.7 mmol/L (ref 0.5–1.9)
Lactic Acid, Venous: 1.2 mmol/L (ref 0.5–1.9)

## 2020-05-01 MED ORDER — SODIUM CHLORIDE 0.9 % IV SOLN
1.0000 g | INTRAVENOUS | Status: DC
  Administered 2020-05-01: 1 g via INTRAVENOUS
  Filled 2020-05-01: qty 10

## 2020-05-01 MED ORDER — MORPHINE SULFATE (PF) 4 MG/ML IV SOLN
4.0000 mg | Freq: Once | INTRAVENOUS | Status: AC
  Administered 2020-05-01: 4 mg via INTRAVENOUS
  Filled 2020-05-01: qty 1

## 2020-05-01 MED ORDER — CEFDINIR 300 MG PO CAPS: 300.0000 mg | ORAL_CAPSULE | Freq: Two times a day (BID) | ORAL | 0 refills | Status: DC

## 2020-05-01 MED ORDER — SODIUM CHLORIDE 0.9 % IV BOLUS (SEPSIS)
1000.0000 mL | Freq: Once | INTRAVENOUS | Status: AC
  Administered 2020-05-01: 1000 mL via INTRAVENOUS

## 2020-05-01 MED ORDER — SODIUM CHLORIDE 0.9 % IV BOLUS
1000.0000 mL | Freq: Once | INTRAVENOUS | Status: AC
  Administered 2020-05-01: 1000 mL via INTRAVENOUS

## 2020-05-01 MED ORDER — ONDANSETRON HCL 4 MG/2ML IJ SOLN
4.0000 mg | Freq: Once | INTRAMUSCULAR | Status: AC
  Administered 2020-05-01: 4 mg via INTRAVENOUS
  Filled 2020-05-01: qty 2

## 2020-05-01 MED ORDER — HYDROMORPHONE HCL 1 MG/ML IJ SOLN
1.0000 mg | Freq: Once | INTRAMUSCULAR | Status: AC
Start: 2020-05-01 — End: 2020-05-01
  Administered 2020-05-01: 1 mg via INTRAVENOUS
  Filled 2020-05-01: qty 1

## 2020-05-01 NOTE — Consult Note (Signed)
CODE SEPSIS - PHARMACY COMMUNICATION  **Broad Spectrum Antibiotics should be administered within 1 hour of Sepsis diagnosis**  Time Code Sepsis Called/Page Received: 1815  Antibiotics Ordered: ceftriaxone  Time of 1st antibiotic administration: 1836  Additional action taken by pharmacy: n/a  If necessary, Name of Provider/Nurse Contacted: [place] ,PharmD, BCPS Clinical Pharmacist  05/01/2020  6:16 PM

## 2020-05-01 NOTE — ED Triage Notes (Addendum)
Patient c/o blood in urine, chills, abdominal pain, and lower back pain. Also c/o being unable to urinate anything more than a dribble. Reports symptoms started yesterday.Patient currently wearing heart monitor for her a-fib. Recently started new b/p medication. Ambulatory with no problems. Takes morphine and xanax through pain clinic

## 2020-05-01 NOTE — ED Provider Notes (Signed)
[facility] Emergency Department Provider Note  ____________________________________________   Event Date/Time   First MD Initiated Contact with Patient 05/01/20 1726     (approximate)  I have reviewed the triage vital signs and the nursing notes.   HISTORY  Chief Complaint Hematuria and Abdominal Pain    HPI [name] is a 60 y.o. female presents emergency department with blood in her urine.  Patient has a history of kidney infections and kidney stones.  Has been hospitalized multiple times for kidney infections.  States she started having chills and lower back pain yesterday.  States she is also wearing a heart monitor for her A. fib.  All of her symptoms started yesterday.  She states she has nausea but no vomiting.    Past Medical History:  Diagnosis Date  . A-fib ([place]) 2021  . Abnormal EKG    HX OF INVERTED T WAVES ON EKG, PALPITATIONS, CHEST PAINS-CARDIAC WORK UP DID NOT SHOW ANY HEART DISEASE  . AC (acromioclavicular) joint bone spurs   . Acute postoperative pain 01/04/2017  . 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 Anemia    Iron Infusion-8 yrs ago  . Anxiety   . Asthma   . Cephalalgia 08/18/2014  . Cervical disc disease 08/18/2014   Needs neck surgery.   . Chronic headaches   . Chronic, continuous use of opioids   . DDD (degenerative disc disease)    CERVICAL AND LUMBAR-CHRONIC PAIN, RT HIP LABRAL TEAR  . Depression    PT STATES A LOT OF STRESS IN HER LIFE  . Dissociative disorder   . Dizziness 04/22/2013  . Duodenal ulcer with hemorrhage and perforation ([place]) 04/27/2003  . Foot drop, right    FROM BACK SURGERY  . GERD (gastroesophageal reflux disease)   . H/O arthrodesis 08/18/2014  . Headache(784.0)    AND NECK PAIN--STATES RECENT TEST SHOW CERVICAL DEGENERATION  . History of blood transfusion    s/p back surgery  . History of cardioversion   . History of cervical spinal surgery 01/04/2015  . History of kidney stones   . Hypertension    . Inverted T wave   . Narrowing of intervertebral disc space 08/18/2014   Currently on disability.   . Orthostatic hypotension 04/22/2013  . Pain    CHRONIC NECK AND BACK PAIN - LIMITED ROM NECK - S/P FUSIONS CERVICAL AND LUMBAR  . Pneumonia   . PONV (postoperative nausea and vomiting)    PT GIVES HX OF N&V AND FEVER WITH SURGERIES YEARS AGO--BUT NO PROBLEMS WITH MORE RECENT SURGERIES--STATES NOT MALIGNANT HYPERTHERMIA  . Postop Hyponatremia 05/14/2012  . Postoperative anemia due to acute blood loss 05/14/2012  . PTSD (post-traumatic stress disorder)   . Right foot drop   . 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   . Sleep apnea 2021   does not have a cpap  . Sleep apnea   . Therapeutic opioid-induced constipation (OIC)     Patient Active Problem List   Diagnosis Date Noted  . Abnormal MRI, shoulder (03/20/2017) 04/21/2020  . Chronic anticoagulation (Eliquis) 04/21/2020  . Chronic neck pain with history of cervical spinal surgery 04/21/2020  . Multiple allergies 04/21/2020  . S/P repair of paraesophageal hernia 02/26/2020  . Heartburn   . Gastritis without bleeding   . Typical atrial flutter ([place])   . Foraminal stenosis of cervical region (C3-4) (Right) 08/26/2019  . 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)  08/26/2019  . DDD (degenerative disc disease), cervical 08/25/2019  . Cervical radiculitis (C4,C5,C8) (Left) 08/20/2019  . Pure hypercholesterolemia 06/27/2019  . Constipation due to opioid therapy 06/27/2019  . Snoring 06/27/2019  . Unstable angina ([place]) 06/10/2019  . Chronic musculoskeletal pain 01/14/2019  . Hypertension 12/27/2018  . Spondylosis without myelopathy or radiculopathy, lumbosacral region 11/12/2018  . History of allergy to radiographic contrast media 11/12/2018  . History of allergy to shellfish 11/12/2018  . DDD (degenerative disc disease), lumbosacral 07/31/2018  . Neuropathic pain 07/31/2018  .  Neurogenic pain 07/31/2018  . Vitamin D deficiency 05/28/2018  . History of lumbar surgery 05/28/2018  . Groin pain, chronic, left 05/20/2018  . Other intervertebral disc degeneration, lumbar region 05/20/2018  . Disorder of skeletal system 05/20/2018  . Problems influencing health status 05/20/2018  . Overweight 12/25/2017  . Enthesopathy of hip region (Left) 07/13/2017  . Trigger point of shoulder region (Left) 02/26/2017  . Pharmacologic therapy   . Polyp of sigmoid colon   . Problems with swallowing and mastication   . Chronic shoulder arthropathy (Left) 01/04/2017  . Dysphagia 12/07/2016  . Abnormal flushing and sweating 12/07/2016  . Long term prescription benzodiazepine use 10/11/2016  . 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 Chronic hip pain (2ry area of Pain) (Bilateral) (L>R) 10/11/2016  . 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 Cervicogenic headache 07/06/2015  . History of total hip replacement (THR) (Right) 07/05/2015  . Chronic shoulder radicular pain (Bilateral) (L>R) 07/05/2015  . Lumbar facet syndrome (Bilateral) (L>R) 07/05/2015  . Chronic low back pain (1ry area of Pain) (Bilateral) (L>R) w/o sciatica 04/05/2015  . Chronic hip pain (Right) 01/04/2015  . Encounter for therapeutic drug level monitoring 01/04/2015  . Long term current use of opiate analgesic 01/04/2015  . Long term prescription opiate use 01/04/2015  . Opiate use 01/04/2015  . Chronic pain syndrome 01/04/2015  . Steroid intolerance 01/04/2015  . Cervical spondylosis 01/04/2015  . Cervical facet arthropathy (Bilateral) 01/04/2015  . History of cervical spinal surgery 01/04/2015  . Chronic shoulder pain (3ry area of Pain) (Bilateral) (L>R) 01/04/2015  . 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 Lumbar spondylosis 01/04/2015  . Cervical facet syndrome (Bilateral) 01/04/2015  . Chronic  sacroiliac joint pain (Left) 01/04/2015  . DOE (dyspnea on exertion) 09/03/2014  . Asthma, mild 08/18/2014  . Blurred vision 08/18/2014  . Benign paroxysmal positional nystagmus 08/18/2014  . Clinical depression 08/18/2014  . Fatigue 08/18/2014  . Cannot sleep 08/18/2014  . Awareness of heartbeats 08/18/2014  . RAD (reactive airway disease) 08/18/2014  . Apnea, sleep 08/18/2014  . Chronic neck pain 04/16/2013  . Anxiety state 08/03/2003  . Colon, diverticulosis 07/15/2003  . IBS (irritable bowel syndrome) 04/27/2003  . Esophagitis, reflux 02/11/2003  . 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 ABDOMINAL HYSTERECTOMY    . afib  05/2019  . ANTERIOR CERVICAL DECOMP/DISCECTOMY FUSION N/A 10/30/2012   Procedure: ACDF C5-6, EXPLORATION AND HARDWARE REMOVAL C6-7;  Surgeon: Melina Schools, MD;  Location: [place];  Service: Orthopedics;  Laterality: N/A;  . ANTERIOR FUSION CERVICAL SPINE  MAY 2012   AT [facility]  . BACK SURGERY  2009   LUMBAR FUSION WITH RODS   . BREAST BIOPSY Right 2008   benign.- bx/clip  . BREAST EXCISIONAL BIOPSY Left 1998   benign  . BREAST REDUCTION SURGERY  Bilateral 06/2016  . CARDIOVERSION N/A 12/03/2019   Procedure: CARDIOVERSION;  Surgeon: Kate Sable, MD;  Location: [facility];  Service: Cardiovascular;  Laterality: N/A;  . CARPAL TUNNEL RELEASE  05-06-12   Right  . CHOLECYSTECTOMY    . COLONOSCOPY WITH PROPOFOL N/A 01/26/2017   Procedure: COLONOSCOPY WITH PROPOFOL;  Surgeon: Lucilla Lame, MD;  Location: [place];  Service: Endoscopy;  Laterality: N/A;  . DIAGNOSTIC LAPAROSCOPIES - MULTIPLE FOR ENDOMETRIOSIS    . ESOPHAGEAL DILATION N/A 01/26/2017   Procedure: ESOPHAGEAL DILATION;  Surgeon: Lucilla Lame, MD;  Location: [place];  Service: Endoscopy;  Laterality: N/A;  . ESOPHAGEAL MANOMETRY N/A 05/30/2017   Procedure: ESOPHAGEAL MANOMETRY (EM);  Surgeon: Lucilla Lame, MD;   Location: [facility];  Service: Endoscopy;  Laterality: N/A;  . ESOPHAGOGASTRODUODENOSCOPY (EGD) WITH PROPOFOL N/A 01/26/2017   Procedure: ESOPHAGOGASTRODUODENOSCOPY (EGD) WITH PROPOFOL;  Surgeon: Lucilla Lame, MD;  Location: [place];  Service: Endoscopy;  Laterality: N/A;  . ESOPHAGOGASTRODUODENOSCOPY (EGD) WITH PROPOFOL N/A 01/30/2020   Procedure: ESOPHAGOGASTRODUODENOSCOPY (EGD) WITH PROPOFOL;  Surgeon: Lucilla Lame, MD;  Location: [place];  Service: Endoscopy;  Laterality: N/A;  sleep apnea COVID + 01-15-20  . HIP ARTHROSCOPY  09/20/2011   Procedure: ARTHROSCOPY HIP;  Surgeon: Gearlean Alf, MD;  Location: [facility];  Service: Orthopedics;  Laterality: Right;  Right Hip Scope with Labral Debridement  . LEFT HEART CATH AND CORONARY ANGIOGRAPHY Left 06/10/2019   Procedure: LEFT HEART CATH AND CORONARY ANGIOGRAPHY;  Surgeon: Nelva Bush, MD;  Location: [place] [facility];  Service: Cardiovascular;  Laterality: Left;  . NASAL SEPTUM SURGERY  MARCH 2013   IN [place]  . [place] IMPEDANCE STUDY N/A 05/30/2017   Procedure: [place] IMPEDANCE STUDY;  Surgeon: Lucilla Lame, MD;  Location: [facility];  Service: Endoscopy;  Laterality: N/A;  . POLYPECTOMY N/A 01/26/2017   Procedure: POLYPECTOMY;  Surgeon: Lucilla Lame, MD;  Location: [place];  Service: Endoscopy;  Laterality: N/A;  . POSTERIOR CERVICAL FUSION/FORAMINOTOMY N/A 04/16/2013   Procedure: REMOVAL CERVICAL PLATES AND INTERBODY CAGE/POSTERIOR CERVICAL SPINAL FUSION C4 - C6/C5 CORPECTOMY/C4 - C6 FUSION WITH ILIAC CREST BONE GRAFT;  Surgeon: Melina Schools, MD;  Location: [place];  Service: Orthopedics;  Laterality: N/A;  . RADIOFREQUENCY ABLATION NERVES    . REDUCTION MAMMAPLASTY Bilateral 05/2016  . RIGHT HIP ARTHROSCOPY FOR LABRAL TEAR  ABOUT 2010   2012 also  . SHOULDER ARTHROSCOPY  05-06-12   bone spur  . TONSILLECTOMY     AS A CHILD  . TOTAL HIP ARTHROPLASTY Right 05/13/2012   Procedure: TOTAL HIP  ARTHROPLASTY ANTERIOR APPROACH;  Surgeon: Gearlean Alf, MD;  Location: [facility];  Service: Orthopedics;  Laterality: Right;    Prior to Admission medications   Medication Sig Start Date End Date Taking? Authorizing Provider  cefdinir (OMNICEF) 300 MG capsule Take 1 capsule (300 mg total) by mouth 2 (two) times daily. 05/01/20  Yes Selso Mannor, Linden Dolin, PA-C  acetaminophen (TYLENOL) 500 MG tablet Take 500-1,000 mg by mouth every 6 (six) hours as needed for moderate pain.     [provider]  ALPRAZolam Duanne Moron) 1 MG tablet Take 1 mg by mouth at bedtime.  07/04/19   [provider]  amiodarone (PACERONE) 200 MG tablet Take 1 tablet (200 mg total) by mouth daily. 03/31/20   Kate Sable, MD  amLODipine (NORVASC) 5 MG tablet Take 1 tablet (5 mg total) by mouth daily. 04/02/20 07/01/20  Kate Sable, MD  apixaban Arne Cleveland) 5  MG TABS tablet Take 1 tablet (5 mg total) by mouth 2 (two) times daily. 03/31/20   Kate Sable, MD  diltiazem (CARDIZEM CD) 120 MG 24 hr capsule Take 1 capsule (120 mg total) by mouth daily. 04/23/20 07/22/20  Kate Sable, MD  linaclotide Rolan Lipa) 290 MCG CAPS capsule Take 1 capsule (290 mcg total) by mouth daily before breakfast. 12/24/19   Lucilla Lame, MD  Menthol, Topical Analgesic, (BENGAY EX) Apply 1 application topically daily as needed (pain).    [provider]  montelukast (SINGULAIR) 10 MG tablet TAKE 1 TABLET BY MOUTH EVERY DAY 03/28/20   Bacigalupo, Dionne Bucy, MD  morphine (MSIR) 15 MG tablet Take 1 tablet (15 mg total) by mouth every 6 (six) hours as needed for moderate pain or severe pain. Must last 30 days. 04/21/20 05/21/20  Milinda Pointer, MD  morphine (MSIR) 15 MG tablet Take 1 tablet (15 mg total) by mouth every 6 (six) hours as needed for moderate pain or severe pain. Must last 30 days. 05/21/20 06/20/20  Milinda Pointer, MD  morphine (MSIR) 15 MG tablet Take 1 tablet (15 mg total) by mouth every 6 (six) hours as needed  for moderate pain or severe pain. Must last 30 days. 06/20/20 07/20/20  Milinda Pointer, MD  ondansetron (ZOFRAN ODT) 4 MG disintegrating tablet Take 1 tablet (4 mg total) by mouth every 8 (eight) hours as needed for nausea or vomiting. 03/24/20   Pabon, Diego F, MD  pantoprazole (PROTONIX) 40 MG tablet TAKE 1 TABLET BY MOUTH TWICE A DAY 10/20/19   Lucilla Lame, MD  polyethylene glycol (MIRALAX / GLYCOLAX) 17 g packet Take 17 g by mouth daily as needed.    [provider]  valACYclovir (VALTREX) 1000 MG tablet TAKE TWO TABLETS BY MOUTH TWICE DAILY FOR ONE DAY FEVER BLISTER 03/02/20   Bacigalupo, Dionne Bucy, MD    Allergies Amoxicillin, Chlorhexidine gluconate, Clindamycin, Codeine, Erythromycin, Penicillin g, Sulfa antibiotics, Levofloxacin, Shellfish allergy, Decadron [dexamethasone], Mangifera indica, Papaya derivatives, Betadine [povidone iodine], Clarithromycin, Other, Povidone-iodine, and Prednisone  Family History  Problem Relation Age of Onset  . Aneurysm Mother   . Aneurysm Maternal Grandmother   . Breast cancer Paternal Grandmother 57  . Bipolar disorder Sister   . Bipolar disorder Grandchild   . Anxiety disorder Grandchild   . Depression Grandchild     Social History Social History   Tobacco Use  . Smoking status: Never Smoker  . Smokeless tobacco: Never Used  Vaping Use  . Vaping Use: Never used  Substance Use Topics  . Alcohol use: No    Alcohol/week: 0.0 standard drinks  . Drug use: Yes    Comment: prescribed morphine and xanax    Review of Systems  Constitutional: No fever/chills Eyes: No visual changes. ENT: No sore throat. Respiratory: Denies cough Cardiovascular: Denies chest pain Gastrointestinal: Denies abdominal pain Genitourinary: Positive for dysuria. Musculoskeletal: Negative for back pain. Skin: Negative for rash. Psychiatric: no mood changes,     ____________________________________________   PHYSICAL EXAM:  VITAL SIGNS: ED Triage  Vitals  Enc Vitals Group     BP 05/01/20 1704 (!) 142/97     Pulse Rate 05/01/20 1704 73     Resp 05/01/20 1704 16     Temp 05/01/20 1704 98.2 F (36.8 C)     Temp Source 05/01/20 1704 Oral     SpO2 05/01/20 1704 100 %     Weight 05/01/20 1705 139 lb (63 kg)     Height 05/01/20 1705  5\' 4"  (1.626 m)     Head Circumference --      Peak Flow --      Pain Score 05/01/20 1704 7     Pain Loc --      Pain Edu? --      Excl. in [place]? --     Constitutional: Alert and oriented. Well appearing and in no acute distress. Eyes: Conjunctivae are normal.  Head: Atraumatic. Nose: No congestion/rhinnorhea. Mouth/Throat: Mucous membranes are moist.   Neck:  supple no lymphadenopathy noted Cardiovascular: Normal rate, regular rhythm. Heart sounds are normal Respiratory: Normal respiratory effort.  No retractions, lungs c t a  Abd: soft nontender bs normal all 4 quad GU: deferred Musculoskeletal: FROM all extremities, warm and well perfused Neurologic:  Normal speech and language.  Skin:  Skin is warm, dry and intact. No rash noted. Psychiatric: Mood and affect are normal. Speech and behavior are normal.  ____________________________________________   LABS (all labs ordered are listed, but only abnormal results are displayed)  Labs Reviewed  COMPREHENSIVE METABOLIC PANEL - Abnormal; Notable for the following components:      Result Value   Sodium 133 (*)    Glucose, Bld 162 (*)    Creatinine, Ser 1.01 (*)    All other components within normal limits  CBC - Abnormal; Notable for the following components:   WBC 12.5 (*)    Platelets 409 (*)    All other components within normal limits  URINALYSIS, COMPLETE (UACMP) WITH MICROSCOPIC - Abnormal; Notable for the following components:   Color, Urine YELLOW (*)    APPearance TURBID (*)    Hgb urine dipstick LARGE (*)    Protein, ur 100 (*)    Leukocytes,Ua LARGE (*)    RBC / HPF >50 (*)    WBC, UA >50 (*)    Bacteria, UA RARE (*)    All  other components within normal limits  LACTIC ACID, PLASMA - Abnormal; Notable for the following components:   Lactic Acid, Venous 2.7 (*)    All other components within normal limits  CULTURE, BLOOD (ROUTINE X 2)  CULTURE, BLOOD (ROUTINE X 2)  URINE CULTURE  LIPASE, BLOOD  LACTIC ACID, PLASMA   ____________________________________________   ____________________________________________  RADIOLOGY  CT renal stone  ____________________________________________   PROCEDURES  Procedure(s) performed: No  Procedures    ____________________________________________   INITIAL IMPRESSION / ASSESSMENT AND PLAN / ED COURSE  Pertinent labs & imaging results that were available during my care of the patient were reviewed by me and considered in my medical decision making (see chart for details).   Patient is a 60 year old female with concerns of UTI versus kidney stone/kidney infection.  See HPI.  Physical exam shows patient to appear stable.  Exam is unremarkable.  DDx: UTI, pyelonephritis, kidney stone, infected kidney stone  CBC with elevated WBC of 12.5, lipase is normal, comprehensive metabolic panel has a sodium of 133 which will be corrected with normal saline, glucose is elevated 162  CT renal stone study   First lactic acid came back high at 2.7, patient was given fluids and Rocephin IV, second lactic acid came back in normal limits  Urinalysis shows large amount of leuks, gram 50 RBCs greater than 50 WBCs white blood cell clumps are present, ordered urine culture,  CT renal stone study, reviewed by me, per my radiology to be negative  Patient is feeling better after pain medication fluids, and antibiotic.  That she be discharged with  a prescription for Omnicef 300 twice daily for 10 days.  Urine culture is to decide if this is an appropriate medication.  She is to follow-up with her regular doctor if not better in 2 to 3 days.  Return emergency department for worsening.   She was discharged in stable condition  [name] was evaluated in Emergency Department on 05/01/2020 for the symptoms described in the history of present illness. She was evaluated in the context of the global COVID-19 pandemic, which necessitated consideration that the patient might be at risk for infection with the SARS-CoV-2 virus that causes COVID-19. Institutional protocols and algorithms that pertain to the evaluation of patients at risk for COVID-19 are in a state of rapid change based on information released by regulatory bodies including the CDC and federal and state organizations. These policies and algorithms were followed during the patient's care in the ED.    As part of my medical decision making, I reviewed the following data within the [place] notes reviewed and incorporated, Labs reviewed , Old chart reviewed, Radiograph reviewed , Notes from prior ED visits and [place] Controlled Substance Database  ____________________________________________   FINAL CLINICAL IMPRESSION(S) / ED DIAGNOSES  Final diagnoses:  Acute UTI      NEW MEDICATIONS STARTED DURING THIS VISIT:  Discharge Medication List as of 05/01/2020  7:38 PM    START taking these medications   Details  cefdinir (OMNICEF) 300 MG capsule Take 1 capsule (300 mg total) by mouth 2 (two) times daily., Starting Sat 05/01/2020, Normal         Note:  This document was prepared using Dragon voice recognition software and may include unintentional dictation errors.    Versie Starks, PA-C 05/01/20 2039    Carrie Mew, MD 05/02/20 [phone]

## 2020-05-01 NOTE — Discharge Instructions (Addendum)
Follow-up with your regular doctor if not improving in 2 to 3 days.  Return emergency department worsening.  Take the antibiotic as prescribed.

## 2020-05-04 LAB — URINE CULTURE: Culture: 80000 — AB

## 2020-05-06 LAB — CULTURE, BLOOD (ROUTINE X 2)
Culture: NO GROWTH
Culture: NO GROWTH
Special Requests: ADEQUATE

## 2020-05-07 ENCOUNTER — Ambulatory Visit: Payer: Medicare Other | Admitting: Cardiology

## 2020-05-07 DIAGNOSIS — I4892 Unspecified atrial flutter: Secondary | ICD-10-CM

## 2020-05-10 ENCOUNTER — Encounter: Payer: Medicare Other | Admitting: Pain Medicine

## 2020-05-10 DIAGNOSIS — G4733 Obstructive sleep apnea (adult) (pediatric): Secondary | ICD-10-CM | POA: Diagnosis not present

## 2020-05-12 ENCOUNTER — Ambulatory Visit
Admission: RE | Admit: 2020-05-12 | Discharge: 2020-05-12 | Disposition: A | Discharge status: Home / Self Care | Payer: Medicare Other | Source: Ambulatory Visit | Attending: Pain Medicine | Admitting: Pain Medicine

## 2020-05-12 ENCOUNTER — Other Ambulatory Visit: Payer: Self-pay

## 2020-05-12 DIAGNOSIS — M5412 Radiculopathy, cervical region: Secondary | ICD-10-CM

## 2020-05-12 DIAGNOSIS — M778 Other enthesopathies, not elsewhere classified: Secondary | ICD-10-CM | POA: Diagnosis not present

## 2020-05-12 DIAGNOSIS — G8929 Other chronic pain: Secondary | ICD-10-CM | POA: Diagnosis not present

## 2020-05-12 DIAGNOSIS — M4602 Spinal enthesopathy, cervical region: Secondary | ICD-10-CM | POA: Diagnosis not present

## 2020-05-12 DIAGNOSIS — M4312 Spondylolisthesis, cervical region: Secondary | ICD-10-CM | POA: Diagnosis not present

## 2020-05-12 DIAGNOSIS — G8928 Other chronic postprocedural pain: Secondary | ICD-10-CM

## 2020-05-12 DIAGNOSIS — M542 Cervicalgia: Secondary | ICD-10-CM

## 2020-05-13 ENCOUNTER — Ambulatory Visit
Admission: RE | Admit: 2020-05-13 | Discharge: 2020-05-13 | Disposition: A | Discharge status: Home / Self Care | Payer: Medicare Other | Source: Ambulatory Visit | Attending: Pain Medicine | Admitting: Pain Medicine

## 2020-05-13 ENCOUNTER — Ambulatory Visit (HOSPITAL_BASED_OUTPATIENT_CLINIC_OR_DEPARTMENT_OTHER): Payer: Medicare Other | Admitting: Pain Medicine

## 2020-05-13 ENCOUNTER — Encounter: Payer: Self-pay | Admitting: Pain Medicine

## 2020-05-13 VITALS — BP 128/75 | HR 61 | Temp 97.4°F | Resp 18 | Ht 64.0 in | Wt 139.0 lb

## 2020-05-13 DIAGNOSIS — G8918 Other acute postprocedural pain: Secondary | ICD-10-CM | POA: Diagnosis not present

## 2020-05-13 DIAGNOSIS — Z888 Allergy status to other drugs, medicaments and biological substances status: Secondary | ICD-10-CM | POA: Insufficient documentation

## 2020-05-13 DIAGNOSIS — Z7901 Long term (current) use of anticoagulants: Secondary | ICD-10-CM | POA: Diagnosis not present

## 2020-05-13 DIAGNOSIS — R2991 Unspecified symptoms and signs involving the musculoskeletal system: Secondary | ICD-10-CM | POA: Insufficient documentation

## 2020-05-13 DIAGNOSIS — Z91013 Allergy to seafood: Secondary | ICD-10-CM

## 2020-05-13 DIAGNOSIS — M7552 Bursitis of left shoulder: Secondary | ICD-10-CM

## 2020-05-13 DIAGNOSIS — M25512 Pain in left shoulder: Secondary | ICD-10-CM | POA: Insufficient documentation

## 2020-05-13 DIAGNOSIS — M778 Other enthesopathies, not elsewhere classified: Secondary | ICD-10-CM | POA: Insufficient documentation

## 2020-05-13 DIAGNOSIS — G8929 Other chronic pain: Secondary | ICD-10-CM | POA: Insufficient documentation

## 2020-05-13 DIAGNOSIS — M19012 Primary osteoarthritis, left shoulder: Secondary | ICD-10-CM | POA: Diagnosis not present

## 2020-05-13 DIAGNOSIS — Z91041 Radiographic dye allergy status: Secondary | ICD-10-CM | POA: Insufficient documentation

## 2020-05-13 DIAGNOSIS — M67912 Unspecified disorder of synovium and tendon, left shoulder: Secondary | ICD-10-CM | POA: Diagnosis not present

## 2020-05-13 DIAGNOSIS — R936 Abnormal findings on diagnostic imaging of limbs: Secondary | ICD-10-CM | POA: Insufficient documentation

## 2020-05-13 DIAGNOSIS — S46812S Strain of other muscles, fascia and tendons at shoulder and upper arm level, left arm, sequela: Secondary | ICD-10-CM

## 2020-05-13 MED ORDER — MIDAZOLAM HCL 5 MG/5ML IJ SOLN
1.0000 mg | INTRAMUSCULAR | Status: DC | PRN
  Administered 2020-05-13: 4 mg via INTRAVENOUS
  Filled 2020-05-13: qty 5

## 2020-05-13 MED ORDER — HYDROCODONE-ACETAMINOPHEN 5-325 MG PO TABS: 1.0000 | ORAL_TABLET | Freq: Three times a day (TID) | ORAL | 0 refills | Status: DC | PRN

## 2020-05-13 MED ORDER — LIDOCAINE HCL 2 % IJ SOLN
20.0000 mL | Freq: Once | INTRAMUSCULAR | Status: AC
  Administered 2020-05-13: 200 mg

## 2020-05-13 MED ORDER — ROPIVACAINE HCL 2 MG/ML IJ SOLN
9.0000 mL | Freq: Once | INTRAMUSCULAR | Status: AC
  Administered 2020-05-13: 10 mL via PERINEURAL
  Filled 2020-05-13: qty 10

## 2020-05-13 MED ORDER — LACTATED RINGERS IV SOLN
1000.0000 mL | Freq: Once | INTRAVENOUS | Status: AC
  Administered 2020-05-13: 1000 mL via INTRAVENOUS

## 2020-05-13 MED ORDER — FENTANYL CITRATE (PF) 100 MCG/2ML IJ SOLN
25.0000 ug | INTRAMUSCULAR | Status: DC | PRN
  Administered 2020-05-13: 75 ug via INTRAVENOUS
  Filled 2020-05-13: qty 2

## 2020-05-13 NOTE — Progress Notes (Signed)
PROVIDER NOTE: Information contained herein reflects review and annotations entered in association with encounter. Interpretation of such information and data should be left to medically-trained personnel. Information provided to patient can be located elsewhere in the medical record under "Patient Instructions". Document created using STT-dictation technology, any transcriptional errors that may result from process are unintentional.    Patient: [name]  Service Category: Procedure  Provider: Gaspar Cola, MD  DOB: 1961/01/03  DOS: 05/13/2020  Location: [facility]  MRN: [id number]  Setting: Ambulatory - outpatient  Referring Provider: Virginia Crews, MD  Type: Established Patient  Specialty: Interventional Pain Management  PCP: Virginia Crews, MD   Primary Reason for Visit: Interventional Pain Management Treatment. CC: Shoulder Pain (left)  Procedure:          Anesthesia, Analgesia, Anxiolysis:  Type: Suprascapular nerve Radiofrequency Ablation #2 (no steroids)  Primary Purpose: Therapeutic Region: Posterior Shoulder & Scapular Areas Level: Superior to the scapular spine, in the lateral aspect of the supraspinatus fossa (Suprescapular notch). Target Area: Suprascapular nerve as it passes thru the lower portion of the suprascapular notch. Approach: Posterior percutaneous approach. Laterality: Left-Side  Type: Moderate (Conscious) Sedation combined with Local Anesthesia Indication(s): Analgesia and Anxiety Route: Intravenous (IV) IV Access: Secured Sedation: Meaningful verbal contact was maintained at all times during the procedure  Local Anesthetic: Lidocaine 1-2%  Position: Prone   Indications: 1. Chronic shoulder pain (Left)   2. Chronic shoulder arthropathy (Left)   3. Osteoarthritis of shoulder (Left)   4. Enthesopathy of shoulder (Left)   5. Tendinopathy of rotator cuff (Left)   6. Sprain of supraspinatus muscle or tendon, sequela (Left)    7. Subdeltoid bursitis of shoulder (Left)   8. Subacromial bursitis of shoulder (Left)   9. Symptoms referable to shoulder joint    Abnormal MRI, shoulder (03/20/2017)    Chronic anticoagulation (Eliquis)    History of allergy to radiographic contrast media    History of allergy to shellfish    [name] has been dealing with the above chronic pain for longer than three months and has either failed to respond, was unable to tolerate, or simply did not get enough benefit from other more conservative therapies including, but not limited to: 1. Over-the-counter medications 2. Anti-inflammatory medications 3. Muscle relaxants 4. Membrane stabilizers 5. Opioids 6. Physical therapy and/or chiropractic manipulation 7. Modalities (Heat, ice, etc.) 8. Invasive techniques such as nerve blocks. [name] has attained more than 50% relief of the pain from a series of diagnostic injections conducted in separate occasions.  Pain Score: Pre-procedure: 5 /10 Post-procedure: 0-No pain/10  Pre-op H&P Assessment:  [name] is a 60 y.o. (year old), female patient, seen today for interventional treatment. She  has a past surgical history that includes RIGHT HIP ARTHROSCOPY FOR LABRAL TEAR (ABOUT 2010); Anterior fusion cervical spine (MAY 2012); Nasal septum surgery (MARCH 2013); Back surgery (2009); Tonsillectomy; Abdominal hysterectomy; DIAGNOSTIC LAPAROSCOPIES - MULTIPLE FOR ENDOMETRIOSIS; Cholecystectomy; Hip arthroscopy (09/20/2011); Shoulder arthroscopy (05-06-12); Carpal tunnel release (05-06-12); Total hip arthroplasty (Right, 05/13/2012); Anterior cervical decomp/discectomy fusion (N/A, 10/30/2012); Posterior cervical fusion/foraminotomy (N/A, 04/16/2013); Breast reduction surgery (Bilateral, 06/2016); Colonoscopy with propofol (N/A, 01/26/2017); Esophagogastroduodenoscopy (egd) with propofol (N/A, 01/26/2017); Esophageal dilation (N/A, 01/26/2017); polypectomy (N/A, 01/26/2017); PH impedance study (N/A,  05/30/2017); Esophageal manometry (N/A, 05/30/2017); Radiofrequency ablation nerves; afib (05/2019); LEFT HEART CATH AND CORONARY ANGIOGRAPHY (Left, 06/10/2019); Cardioversion (N/A, 12/03/2019); Esophagogastroduodenoscopy (egd) with propofol (N/A, 01/30/2020); Reduction mammaplasty (Bilateral, 05/2016); Breast biopsy (Right, 2008); and  Breast excisional biopsy (Left, 1998). [name] has a current medication list which includes the following prescription(s): acetaminophen, alprazolam, amiodarone, apixaban, diltiazem, hydrocodone-acetaminophen, [START ON 05/20/2020] hydrocodone-acetaminophen, linaclotide, menthol (topical analgesic), montelukast, morphine, [START ON 05/21/2020] morphine, [START ON 06/20/2020] morphine, ondansetron, pantoprazole, polyethylene glycol, valacyclovir, amlodipine, buspirone, and cefdinir, and the following Facility-Administered Medications: fentanyl and midazolam. [name] primarily concern today is the Shoulder Pain (left)  Initial Vital Signs:  Pulse/HCG Rate: 61ECG Heart Rate: 63 Temp: (!) 96.6 F (35.9 C) Resp: 16 BP: (!) 151/88 SpO2: 100 %  BMI: Estimated body mass index is 23.86 kg/m as calculated from the following:   Height as of this encounter: 5\' 4"  (1.626 m).   Weight as of this encounter: 139 lb (63 kg).  Risk Assessment: Allergies: Reviewed. She is allergic to amoxicillin, chlorhexidine gluconate, clindamycin, codeine, erythromycin, penicillin g, sulfa antibiotics, levofloxacin, shellfish allergy, decadron [dexamethasone], mangifera indica, papaya derivatives, betadine [povidone iodine], clarithromycin, other, povidone-iodine, and prednisone.  Allergy Precautions: None required Coagulopathies: Reviewed. None identified.  Blood-thinner therapy: None at this time Active Infection(s): Reviewed. None identified. [name] is afebrile  Site Confirmation: [name] was asked to confirm the procedure and laterality before marking the site Procedure checklist:  Completed Consent: Before the procedure and under the influence of no sedative(s), amnesic(s), or anxiolytics, the patient was informed of the treatment options, risks and possible complications. To fulfill our ethical and legal obligations, as recommended by the American Medical Association's Code of Ethics, I have informed the patient of my clinical impression; the nature and purpose of the treatment or procedure; the risks, benefits, and possible complications of the intervention; the alternatives, including doing nothing; the risk(s) and benefit(s) of the alternative treatment(s) or procedure(s); and the risk(s) and benefit(s) of doing nothing. The patient was provided information about the general risks and possible complications associated with the procedure. These may include, but are not limited to: failure to achieve desired goals, infection, bleeding, organ or nerve damage, allergic reactions, paralysis, and death. In addition, the patient was informed of those risks and complications associated to the procedure, such as failure to decrease pain; infection; bleeding; organ or nerve damage with subsequent damage to sensory, motor, and/or autonomic systems, resulting in permanent pain, numbness, and/or weakness of one or several areas of the body; allergic reactions; (i.e.: anaphylactic reaction); and/or death. Furthermore, the patient was informed of those risks and complications associated with the medications. These include, but are not limited to: allergic reactions (i.e.: anaphylactic or anaphylactoid reaction(s)); adrenal axis suppression; blood sugar elevation that in diabetics may result in ketoacidosis or comma; water retention that in patients with history of congestive heart failure may result in shortness of breath, pulmonary edema, and decompensation with resultant heart failure; weight gain; swelling or edema; medication-induced neural toxicity; particulate matter embolism and blood vessel  occlusion with resultant organ, and/or nervous system infarction; and/or aseptic necrosis of one or more joints. Finally, the patient was informed that Medicine is not an exact science; therefore, there is also the possibility of unforeseen or unpredictable risks and/or possible complications that may result in a catastrophic outcome. The patient indicated having understood very clearly. We have given the patient no guarantees and we have made no promises. Enough time was given to the patient to ask questions, all of which were answered to the patient's satisfaction. Ms. Minahan has indicated that she wanted to continue with the procedure. Attestation: I, the ordering provider, attest that I have discussed with the patient the benefits, risks,  side-effects, alternatives, likelihood of achieving goals, and potential problems during recovery for the procedure that I have provided informed consent. Date  Time: 05/13/2020  9:53 AM  Pre-Procedure Preparation:  Monitoring: As per clinic protocol. Respiration, ETCO2, SpO2, BP, heart rate and rhythm monitor placed and checked for adequate function Safety Precautions: Patient was assessed for positional comfort and pressure points before starting the procedure. Time-out: I initiated and conducted the "Time-out" before starting the procedure, as per protocol. The patient was asked to participate by confirming the accuracy of the "Time Out" information. Verification of the correct person, site, and procedure were performed and confirmed by me, the nursing staff, and the patient. "Time-out" conducted as per Joint Commission's Universal Protocol (UP.01.01.01). Time: 1133  Description of Procedure:          Area Prepped: Entire shoulder and scapular area DuraPrep (Iodine Povacrylex [0.7% available iodine] and Isopropyl Alcohol, 74% w/w) Safety Precautions: Aspiration looking for blood return was conducted prior to all injections. At no point did we inject any  substances, as a needle was being advanced. No attempts were made at seeking any paresthesias. Safe injection practices and needle disposal techniques used. Medications properly checked for expiration dates. SDV (single dose vial) medications used. Description of the Procedure: Protocol guidelines were followed. The patient was placed in position over the procedure table. The target area was identified and the area prepped in the usual manner. The skin and muscle were infiltrated with local anesthetic. Appropriate amount of time allowed to pass for local anesthetics to take effect. Radiofrequency needles were introduced to the target area using fluoroscopic guidance. Using the NeuroTherm NT1100 Radiofrequency Generator, sensory stimulation using 50 Hz was used to locate & identify the nerve, making sure that the needle was positioned such that there was no sensory stimulation below 0.3 V or above 0.7 V. Stimulation using 2 Hz was used to evaluate the motor component. Care was taken not to lesion any nerves that demonstrated motor stimulation of the lower extremities at an output of less than 2.5 times that of the sensory threshold, or a maximum of 2.0 V. Once satisfactory placement of the needles was achieved, the numbing solution was slowly injected after negative aspiration. After waiting for at least 2 minutes, the ablation was performed at 80 degrees C for 60 seconds, using regular Radiofrequency settings. Once the procedure was completed, the needles were then removed and the area cleansed, making sure to leave some of the prepping solution back to take advantage of its long term bactericidal properties. Intra-operative Compliance: Compliant   Vitals:   05/13/20 1144 05/13/20 1154 05/13/20 1204 05/13/20 1215  BP: 140/85 122/70 129/72 128/75  Pulse:      Resp: 18 10 18 18   Temp:  97.6 F (36.4 C)  (!) 97.4 F (36.3 C)  TempSrc:      SpO2: 97% 99% 100% 100%  Weight:      Height:        Start  Time: 1133 hrs. End Time: 1144 hrs.  Materials & Medications:  Needle(s) Type: Teflon-coated, curved tip, Radiofrequency needle(s) Gauge: 22G Length: 10cm Medication(s): Please see orders for medications and dosing details.  Imaging Guidance (Non-Spinal):          Type of Imaging Technique: Fluoroscopy Guidance (Non-Spinal) Indication(s): Assistance in needle guidance and placement for procedures requiring needle placement in or near specific anatomical locations not easily accessible without such assistance. Exposure Time: Please see nurses notes. Contrast: Before injecting any contrast, we confirmed  that the patient did not have an allergy to iodine, shellfish, or radiological contrast. Once satisfactory needle placement was completed at the desired level, radiological contrast was injected. Contrast injected under live fluoroscopy. No contrast complications. See chart for type and volume of contrast used. Fluoroscopic Guidance: I was personally present during the use of fluoroscopy. "Tunnel Vision Technique" used to obtain the best possible view of the target area. Parallax error corrected before commencing the procedure. "Direction-depth-direction" technique used to introduce the needle under continuous pulsed fluoroscopy. Once target was reached, antero-posterior, oblique, and lateral fluoroscopic projection used confirm needle placement in all planes. Images permanently stored in EMR. Interpretation: I personally interpreted the imaging intraoperatively. Adequate needle placement confirmed in multiple planes. Appropriate spread of contrast into desired area was observed. No evidence of afferent or efferent intravascular uptake. Permanent images saved into the patient's record.  Antibiotic Prophylaxis:   Anti-infectives (From admission, onward)   None     Indication(s): None identified  Post-operative Assessment:  Post-procedure Vital Signs:  Pulse/HCG Rate: 6160 Temp: (!) 97.4 F  (36.3 C) Resp: 18 BP: 128/75 SpO2: 100 %  EBL: None  Complications: No immediate post-treatment complications observed by team, or reported by patient.  Note: The patient tolerated the entire procedure well. A repeat set of vitals were taken after the procedure and the patient was kept under observation following institutional policy, for this type of procedure. Post-procedural neurological assessment was performed, showing return to baseline, prior to discharge. The patient was provided with post-procedure discharge instructions, including a section on how to identify potential problems. Should any problems arise concerning this procedure, the patient was given instructions to immediately contact us, at any time, without hesitation. In any case, we plan to contact the patient by telephone for a follow-up status report regarding this interventional procedure.  Comments:  No additional relevant information.  Plan of Care  Orders:  Orders Placed This Encounter  Procedures  . Radiofrequency Shoulder Joint    For shoulder pain.    Scheduling Instructions:     Procedure: Suprascapular Nerve Radiofrequency Ablation     Laterality: Left-sided     Level(s): Suprascapular nerve     Sedation: With Sedation.     Scheduling Timeframe: Today    Order Specific Question:   Where will this procedure be performed?    Answer:   [facility]  . DG PAIN [facility] C-ARM 1-60 MIN NO REPORT    Intraoperative interpretation by procedural physician at [place].    Standing Status:   Standing    Number of Occurrences:   1    Order Specific Question:   Reason for exam:    Answer:   Assistance in needle guidance and placement for procedures requiring needle placement in or near specific anatomical locations not easily accessible without such assistance.  . Informed Consent Details: Physician/Practitioner Attestation; Transcribe to consent form and obtain patient signature    Note: Always  confirm laterality of pain with [name], before procedure.    Order Specific Question:   Physician/Practitioner attestation of informed consent for procedure/surgical case    Answer:   I, the physician/practitioner, attest that I have discussed with the patient the benefits, risks, side effects, alternatives, likelihood of achieving goals and potential problems during recovery for the procedure that I have provided informed consent.    Order Specific Question:   Procedure    Answer:   Suprascapular Nerva Radiofrequency Ablation    Order Specific Question:  Physician/Practitioner performing the procedure    Answer:   [place]. Dossie Arbour, MD    Order Specific Question:   Indication/Reason    Answer:   Chronic shoulder pain (arthralgia)  . Care order/instruction: Please confirm that the patient has stopped the Eliquis (Apixaban) x 3 days prior to procedure or surgery.    Please confirm that the patient has stopped the Eliquis (Apixaban) x 3 days prior to procedure or surgery.    Standing Status:   Standing    Number of Occurrences:   1  . Provide equipment / supplies at bedside    "Radiofrequency Tray"; Large hemostat (x1); Small hemostat (x1); Towels (x8); 4x4 sterile sponge pack (x1) Needle type: Teflon-coated Radiofrequency Needle (Disposable  single use) Size: Short Quantity: 1    Standing Status:   Standing    Number of Occurrences:   1    Order Specific Question:   Specify    Answer:   Radiofrequency Tray  . Bleeding precautions    Standing Status:   Standing    Number of Occurrences:   1  . Miscellanous precautions    Standing Status:   Standing    Number of Occurrences:   1  . Miscellanous precautions    NOTE: Although It is true that patients can have allergies to shellfish and that shellfish contain iodine, most shellfish  allergies are due to two protein allergens present in the shellfish: tropomyosins and parvalbumin. Not all patients with shellfish allergies are allergic  to iodine. However, as a precaution, avoid using iodine containing products.    Standing Status:   Standing    Number of Occurrences:   1  . Skin care precautions    Patient indicates being allergic to a specific skin prepping solution. Please avoid.    Standing Status:   Standing    Number of Occurrences:   1   Chronic Opioid Analgesic:  Morphine IR 15 mg 1 tab p.o. every 6 hours (60 mg/day of morphine) MME/day: 60 mg/day.   Medications ordered for procedure: Meds ordered this encounter  Medications  . lidocaine (XYLOCAINE) 2 % (with pres) injection 400 mg  . lactated ringers infusion 1,000 mL  . midazolam (VERSED) 5 MG/5ML injection 1-2 mg    Make sure Flumazenil is available in the pyxis when using this medication. If oversedation occurs, administer 0.2 mg IV over 15 sec. If after 45 sec no response, administer 0.2 mg again over 1 min; may repeat at 1 min intervals; not to exceed 4 doses (1 mg)  . fentaNYL (SUBLIMAZE) injection 25-50 mcg    Make sure Narcan is available in the pyxis when using this medication. In the event of respiratory depression (RR< 8/min): Titrate NARCAN (naloxone) in increments of 0.1 to 0.2 mg IV at 2-3 minute intervals, until desired degree of reversal.  . ropivacaine (PF) 2 mg/mL (0.2%) (NAROPIN) injection 9 mL  . HYDROcodone-acetaminophen (NORCO/VICODIN) 5-325 MG tablet    Sig: Take 1 tablet by mouth every 8 (eight) hours as needed for up to 7 days for severe pain. Must last 7 days.    Dispense:  21 tablet    Refill:  0    For acute post-operative pain. Not to be refilled. Must last 7 days.  Marland Kitchen HYDROcodone-acetaminophen (NORCO/VICODIN) 5-325 MG tablet    Sig: Take 1 tablet by mouth every 8 (eight) hours as needed for up to 7 days for severe pain. Must last for 7 days.    Dispense:  21  tablet    Refill:  0    For acute post-operative pain. Not to be refilled. Must last 7 days.   Medications administered: We administered lidocaine, lactated ringers,  midazolam, fentaNYL, and ropivacaine (PF) 2 mg/mL (0.2%).  See the medical record for exact dosing, route, and time of administration.  Follow-up plan:   Return in about 6 weeks (around 06/24/2020) for on pm PD-schld, (VV), (PPE).       Interventional Therapies  Risk  Complexity Considerations:   1. SHELLFISH ALLERGY; Dexamethasone & Prednisone allergy.  (NO STEROIDS)  2. NOT a candidate for lumbar facet RFA (due to hardware). 3. NOT a candidate for membrane stabilizers (Neurontin/Lyrica) due to prior failed trials. 4. NOT a candidate for SCS (due to HARDWARE). 5. NOT a candidate for IT Pump (patient's choice due to surgical risks).     Planned  Pending:   Pending further evaluation   Under consideration:   Possiblerightsuprascapular nerve RFA #1   Completed:   Diagnostic bilateral lumbar facet block x2 (01/14/2019) Palliativebilateral suprascapular NB x2(No Steroids) (06/19/2016) Palliative leftsuprascapular nerve RFA x1(01/04/2017)   Therapeutic  Palliative (PRN) options:   Diagnostic bilateral lumbar facet block #2  Palliativebilateral suprascapular NB #3(No Steroids) Palliative leftsuprascapular nerve RFA #2     Recent Visits Date Type Provider Dept  04/21/20 Office Visit Milinda Pointer, MD [facility]  Showing recent visits within past 90 days and meeting all other requirements Today's Visits Date Type Provider Dept  05/13/20 Procedure visit Milinda Pointer, MD [facility]  Showing today's visits and meeting all other requirements Future Appointments Date Type Provider Dept  06/24/20 Appointment Milinda Pointer, MD [facility]  07/19/20 Appointment Milinda Pointer, MD [facility]  Showing future appointments within next 90 days and meeting all other requirements  Disposition: Discharge home  Discharge (Date  Time): 05/13/2020; 1216 hrs.   Primary Care Physician: Virginia Crews, MD Location: [facility]  Outpatient Pain Management Facility Note by: Gaspar Cola, MD Date: 05/13/2020; Time: 12:21 PM  Disclaimer:  Medicine is not an Chief Strategy Officer. The only guarantee in medicine is that nothing is guaranteed. It is important to note that the decision to proceed with this intervention was based on the information collected from the patient. The Data and conclusions were drawn from the patient's questionnaire, the interview, and the physical examination. Because the information was provided in large part by the patient, it cannot be guaranteed that it has not been purposely or unconsciously manipulated. Every effort has been made to obtain as much relevant data as possible for this evaluation. It is important to note that the conclusions that lead to this procedure are derived in large part from the available data. Always take into account that the treatment will also be dependent on availability of resources and existing treatment guidelines, considered by other Pain Management Practitioners as being common knowledge and practice, at the time of the intervention. For Medico-Legal purposes, it is also important to point out that variation in procedural techniques and pharmacological choices are the acceptable norm. The indications, contraindications, technique, and results of the above procedure should only be interpreted and judged by a Board-Certified Interventional Pain Specialist with extensive familiarity and expertise in the same exact procedure and technique.

## 2020-05-13 NOTE — Progress Notes (Signed)
Safety precautions to be maintained throughout the outpatient stay will include: orient to surroundings, keep bed in low position, maintain call bell within reach at all times, provide assistance with transfer out of bed and ambulation.  

## 2020-05-13 NOTE — Patient Instructions (Signed)

## 2020-05-14 ENCOUNTER — Other Ambulatory Visit: Payer: Medicare Other

## 2020-05-14 ENCOUNTER — Telehealth: Payer: Self-pay

## 2020-05-14 DIAGNOSIS — M5412 Radiculopathy, cervical region: Secondary | ICD-10-CM | POA: Diagnosis not present

## 2020-05-14 DIAGNOSIS — M47812 Spondylosis without myelopathy or radiculopathy, cervical region: Secondary | ICD-10-CM | POA: Diagnosis not present

## 2020-05-14 NOTE — Telephone Encounter (Signed)
Post procedure phone call. Patient states she is doing good.  

## 2020-05-18 ENCOUNTER — Other Ambulatory Visit: Payer: Self-pay | Admitting: Gastroenterology

## 2020-05-19 ENCOUNTER — Telehealth: Payer: Self-pay

## 2020-05-19 ENCOUNTER — Telehealth: Payer: Self-pay | Admitting: Pain Medicine

## 2020-05-19 ENCOUNTER — Encounter: Payer: Medicare Other | Admitting: Pain Medicine

## 2020-05-19 ENCOUNTER — Telehealth: Payer: Self-pay | Admitting: Family Medicine

## 2020-05-19 NOTE — Telephone Encounter (Signed)
Pt needs rx filled and current pharm is out and does not know when they will have it back instock.

## 2020-05-19 NOTE — Telephone Encounter (Signed)
Pt called and stated that her insurance company wants her Provider to verify that pt is using her CPAP machine at least 4hrs a night for insurance to continue covering it / Pt wants to know if she needs to bring her Cpap machine to her appt on Friday / please advise

## 2020-05-19 NOTE — Telephone Encounter (Signed)
[facility] called stating they are unable to fill [name] script for Hydrocodone 5/.325 do not know when they will have as it is a manufacturer supply issue. Please advise pharmacy and patient of solution.

## 2020-05-20 ENCOUNTER — Other Ambulatory Visit: Payer: Self-pay | Admitting: Student in an Organized Health Care Education/Training Program

## 2020-05-20 ENCOUNTER — Telehealth: Payer: Self-pay

## 2020-05-20 ENCOUNTER — Telehealth: Payer: Self-pay | Admitting: Cardiology

## 2020-05-20 DIAGNOSIS — I483 Typical atrial flutter: Secondary | ICD-10-CM

## 2020-05-20 DIAGNOSIS — I495 Sick sinus syndrome: Secondary | ICD-10-CM

## 2020-05-20 DIAGNOSIS — I4892 Unspecified atrial flutter: Secondary | ICD-10-CM | POA: Diagnosis not present

## 2020-05-20 DIAGNOSIS — I442 Atrioventricular block, complete: Secondary | ICD-10-CM

## 2020-05-20 DIAGNOSIS — I471 Supraventricular tachycardia: Secondary | ICD-10-CM

## 2020-05-20 DIAGNOSIS — G8929 Other chronic pain: Secondary | ICD-10-CM

## 2020-05-20 DIAGNOSIS — M19012 Primary osteoarthritis, left shoulder: Secondary | ICD-10-CM

## 2020-05-20 DIAGNOSIS — M25512 Pain in left shoulder: Secondary | ICD-10-CM

## 2020-05-20 MED ORDER — HYDROCODONE-ACETAMINOPHEN 10-325 MG PO TABS: 0.5000 | ORAL_TABLET | Freq: Three times a day (TID) | ORAL | 0 refills | Status: AC | PRN

## 2020-05-20 NOTE — Telephone Encounter (Signed)
Result note received. Patient called, see subsequent tele encounter for today.

## 2020-05-20 NOTE — Telephone Encounter (Signed)
Dr Holley Raring notified that the Hydrocodone orders were 5/325, 1 tablet q8h prn severe pain for 7 days fro post RFA pain. #21.  Order for Hydrocodone 10/325 mg Take 0.5 tablet every 8 hours prn escribed.  Patient notified that script had been sent in and she wans notified to only take 1/2 tablet instead of a whole tablet.  Notified of dosage change.  Patient states understanding.

## 2020-05-20 NOTE — Telephone Encounter (Signed)
Answer call from [place] with [facility] monitor. She reports critical results of  Complete HB rate of 36 BPM  Lasting 16 secs (04/27/2020 at 12:31pm Immediatly following symptomatic bradycardiac 40 bpm  Lasting 30 sec.  Critical results verbalized to Dr. Garen Lah 05/20/2020 at 12:04pm No verbal orders at this time, Izora Gala [employer] scientist) is printing the report for Dr. Mylo Red to review.

## 2020-05-20 NOTE — Telephone Encounter (Signed)
Dr Holley Raring, how do you want to handle these patients who are out of their Hydrocodone?  The pharmacis have no idea when they will be back in stock.

## 2020-05-20 NOTE — Telephone Encounter (Signed)
[facility] calling for urgent results.  Requesting to speak with RN now to notify [facility] .

## 2020-05-20 NOTE — Progress Notes (Signed)
Pharmacy does not have any more 5 mg hydrocodone prescription.  We will send in a new prescription of equal dose however a 10 mg tablet.  PMP checked and appropriate.  Requested Prescriptions   Signed Prescriptions Disp Refills  . HYDROcodone-acetaminophen (NORCO) 10-325 MG tablet 20 tablet 0    Sig: Take 0.5 tablets by mouth every 8 (eight) hours as needed for up to 14 days for severe pain. Must last 30 days.

## 2020-05-20 NOTE — Telephone Encounter (Signed)
No need to bring CPAP. We just need to document in the note about her usage.  Please leave [place] a note on the schedule to document this. Thanks!

## 2020-05-20 NOTE — Telephone Encounter (Signed)
Called patient and gave her the result note from Dr. Garen Lah as seen below. Put referral in and messaged Gracy Bruins to help facilitate an urgent appointment at our [address] office as we do not have any available appointments in our office for a few weeks.  Kate Sable, MD  Kavin Leech, RN Paroxysmal SVTs, episodes of A. fib noted. Sinus bradycardia noted with heart rates in the 30s. Stop Cardizem. Refer patient to EP (ASAP) to evaluate for possible tachybradycardia syndrome. Thank you    Patient verbalized understanding and agreed with plan.

## 2020-05-21 ENCOUNTER — Encounter: Payer: Self-pay | Admitting: Family Medicine

## 2020-05-21 ENCOUNTER — Other Ambulatory Visit: Payer: Self-pay

## 2020-05-21 ENCOUNTER — Ambulatory Visit (INDEPENDENT_AMBULATORY_CARE_PROVIDER_SITE_OTHER): Payer: Medicare Other | Admitting: Family Medicine

## 2020-05-21 VITALS — BP 106/76 | HR 76 | Temp 98.6°F | Resp 16 | Wt 143.0 lb

## 2020-05-21 DIAGNOSIS — G4733 Obstructive sleep apnea (adult) (pediatric): Secondary | ICD-10-CM | POA: Diagnosis not present

## 2020-05-21 DIAGNOSIS — R300 Dysuria: Secondary | ICD-10-CM | POA: Diagnosis not present

## 2020-05-21 LAB — POCT URINALYSIS DIPSTICK
Bilirubin, UA: NEGATIVE
Glucose, UA: NEGATIVE
Ketones, UA: NEGATIVE
Leukocytes, UA: NEGATIVE
Nitrite, UA: NEGATIVE
Protein, UA: NEGATIVE
Spec Grav, UA: 1.025 (ref 1.010–1.025)
Urobilinogen, UA: 0.2 E.U./dL
pH, UA: 6 (ref 5.0–8.0)

## 2020-05-21 NOTE — Progress Notes (Signed)
Established patient visit   Patient: [name]   DOB: 04/12/1960   60 y.o. Female  MRN: [id number] Visit Date: 05/21/2020  Today's healthcare provider: Lavon Paganini, MD   Chief Complaint  Patient presents with  . Follow-up   Subjective    HPI  Follow up ER visit  Patient was seen in ER for unable to urinate and blood in urine on 05/01/20. She was treated for UTI. Treatment for this included Omnicef. She reports excellent compliance with treatment. She reports this condition is Improved.  Still has some occasional dysuria and frequency -----------------------------------------------------------------------------------------  Follow up for sleep apnea:  The patient was last seen for this 01/13/2020. Changes made at last visit include none.  She reports excellent compliance with CPAP. Patient reports 6-8 hours every night. She feels that condition is Improved. She is not having side effects.  Feels better controlled since starting CPAP  -----------------------------------------------------------------------------------------   Patient Active Problem List   Diagnosis Date Noted  . Enthesopathy of shoulder (Left) 05/13/2020  . 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 Symptoms referable to shoulder joint 05/13/2020  . Tendinopathy of rotator cuff (Left) 05/13/2020  . Sprain of supraspinatus muscle or tendon, sequela (Left) 05/13/2020  . Subdeltoid bursitis of shoulder (Left) 05/13/2020  . Subacromial bursitis of shoulder (Left) 05/13/2020  . Chronic shoulder pain (Left) 05/13/2020  . Acute postoperative pain 05/13/2020  . History of allergy to iodine 05/13/2020    Class: History of  . Abnormal MRI, shoulder (03/20/2017) 04/21/2020  . Chronic anticoagulation (Eliquis) 04/21/2020  . Chronic neck pain with history of cervical spinal surgery 04/21/2020  . Multiple allergies 04/21/2020  . S/P repair of paraesophageal hernia 02/26/2020  .  Heartburn   . Gastritis without bleeding   . Typical atrial flutter ([place])   . Foraminal stenosis of cervical region (C3-4) (Right) 08/26/2019  . 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) 08/26/2019  . DDD (degenerative disc disease), cervical 08/25/2019  . Cervical radiculitis (C4,C5,C8) (Left) 08/20/2019  . Pure hypercholesterolemia 06/27/2019  . Constipation due to opioid therapy 06/27/2019  . Snoring 06/27/2019  . Unstable angina ([place]) 06/10/2019  . Chronic musculoskeletal pain 01/14/2019  . Hypertension 12/27/2018  . Spondylosis without myelopathy or radiculopathy, lumbosacral region 11/12/2018  . History of allergy to radiographic contrast media 11/12/2018  . History of allergy to shellfish 11/12/2018  . DDD (degenerative disc disease), lumbosacral 07/31/2018  . Neuropathic pain 07/31/2018  . Neurogenic pain 07/31/2018  . Vitamin D deficiency 05/28/2018  . History of lumbar surgery 05/28/2018  . Groin pain, chronic, left 05/20/2018  . Other intervertebral disc degeneration, lumbar region 05/20/2018  . Disorder of skeletal system 05/20/2018  . Problems influencing health status 05/20/2018  . Overweight 12/25/2017  . Enthesopathy of hip region (Left) 07/13/2017  . Trigger point of shoulder region (Left) 02/26/2017  . Pharmacologic therapy   . Polyp of sigmoid colon   . Problems with swallowing and mastication   . Chronic shoulder arthropathy (Left) 01/04/2017  . Dysphagia 12/07/2016  . Abnormal flushing and sweating 12/07/2016  . Long term prescription benzodiazepine use 10/11/2016  . 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 Chronic hip pain (2ry area of Pain) (Bilateral) (L>R) 10/11/2016  . 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 Cervicogenic headache 07/06/2015  . History of total hip replacement (THR) (Right) 07/05/2015  . Chronic shoulder radicular pain (Bilateral) (L>R) 07/05/2015  . Lumbar facet  syndrome (Bilateral) (L>R)  07/05/2015  . Chronic low back pain (1ry area of Pain) (Bilateral) (L>R) w/o sciatica 04/05/2015  . Chronic hip pain (Right) 01/04/2015  . Encounter for therapeutic drug level monitoring 01/04/2015  . Long term current use of opiate analgesic 01/04/2015  . Long term prescription opiate use 01/04/2015  . Opiate use 01/04/2015  . Chronic pain syndrome 01/04/2015  . Steroid intolerance 01/04/2015  . Cervical spondylosis 01/04/2015  . Cervical facet arthropathy (Bilateral) 01/04/2015  . History of cervical spinal surgery 01/04/2015  . Chronic shoulder pain (3ry area of Pain) (Bilateral) (L>R) 01/04/2015  . 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 Lumbar spondylosis 01/04/2015  . Cervical facet syndrome (Bilateral) 01/04/2015  . Chronic sacroiliac joint pain (Left) 01/04/2015  . DOE (dyspnea on exertion) 09/03/2014  . Asthma, mild 08/18/2014  . Blurred vision 08/18/2014  . Benign paroxysmal positional nystagmus 08/18/2014  . Clinical depression 08/18/2014  . Fatigue 08/18/2014  . Cannot sleep 08/18/2014  . Awareness of heartbeats 08/18/2014  . RAD (reactive airway disease) 08/18/2014  . OSA (obstructive sleep apnea) 08/18/2014  . Chronic neck pain 04/16/2013  . Anxiety state 08/03/2003  . Colon, diverticulosis 07/15/2003  . IBS (irritable bowel syndrome) 04/27/2003  . Esophagitis, reflux 02/11/2003  . 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 Smoking status: Never Smoker  . Smokeless tobacco: Never Used  Vaping Use  . Vaping Use: Never used  Substance Use Topics  . Alcohol use: No    Alcohol/week: 0.0 standard drinks  . Drug use: Yes    Comment: prescribed morphine and xanax   Allergies  Allergen Reactions  . Amoxicillin Hives    She did ok w ANCEF  . Chlorhexidine Gluconate Dermatitis and Hives  . Clindamycin Hives  . Codeine Hives  .  Erythromycin Hives    "mycins" in general  . Penicillin G Hives    "cillins" in general  . Sulfa Antibiotics Nausea And Vomiting and Hives       . Levofloxacin Hives  . Shellfish Allergy Hives  . Decadron [Dexamethasone] Other (See Comments)    Hot flashes, insomnia, "manic" Hot flashes, insomnia, "manic"  . Mangifera Indica Hives    papaya  . Papaya Derivatives Hives  . Betadine [Povidone Iodine] Hives       . Clarithromycin Hives  . Other Hives     Mango   . Povidone-Iodine Hives  . Prednisone Anxiety    High blood pressure, flushed, mood changes, heart palpitations High blood pressure, flushed, mood changes, heart palpitations       Medications: Outpatient Medications Prior to Visit  Medication Sig  . acetaminophen (TYLENOL) 500 MG tablet Take 500-1,000 mg by mouth every 6 (six) hours as needed for moderate pain.   Marland Kitchen ALPRAZolam (XANAX) 1 MG tablet Take 1 mg by mouth at bedtime.   Marland Kitchen amiodarone (PACERONE) 200 MG tablet Take 1 tablet (200 mg total) by mouth daily.  Marland Kitchen apixaban (ELIQUIS) 5 MG TABS tablet Take 1 tablet (5 mg total) by mouth 2 (two) times daily.  . busPIRone (BUSPAR) 7.5 MG tablet Take 7.5 mg by mouth 2 (two) times daily.  Marland Kitchen HYDROcodone-acetaminophen (NORCO) 10-325 MG tablet Take 0.5 tablets by mouth every 8 (eight) hours as needed for up to 14 days for severe pain. Must last 30 days.  Marland Kitchen linaclotide (LINZESS) 290 MCG CAPS capsule Take 1 capsule (290 mcg total) by mouth daily before  breakfast.  . Menthol, Topical Analgesic, (BENGAY EX) Apply 1 application topically daily as needed (pain).  . montelukast (SINGULAIR) 10 MG tablet TAKE 1 TABLET BY MOUTH EVERY DAY  . morphine (MSIR) 15 MG tablet Take 1 tablet (15 mg total) by mouth every 6 (six) hours as needed for moderate pain or severe pain. Must last 30 days.  Marland Kitchen morphine (MSIR) 15 MG tablet Take 1 tablet (15 mg total) by mouth every 6 (six) hours as needed for moderate pain or severe pain. Must last 30 days.  Derrill Memo ON 06/20/2020] morphine (MSIR) 15 MG tablet Take 1 tablet (15 mg total) by mouth every 6 (six) hours as needed for moderate pain or severe pain. Must last 30 days.  . ondansetron (ZOFRAN ODT) 4 MG disintegrating tablet Take 1 tablet (4 mg total) by mouth every 8 (eight) hours as needed for nausea or vomiting.  . pantoprazole (PROTONIX) 40 MG tablet TAKE 1 TABLET BY MOUTH TWICE A DAY  . polyethylene glycol (MIRALAX / GLYCOLAX) 17 g packet Take 17 g by mouth daily as needed.  . valACYclovir (VALTREX) 1000 MG tablet TAKE TWO TABLETS BY MOUTH TWICE DAILY FOR ONE DAY FEVER BLISTER  . [DISCONTINUED] amLODipine (NORVASC) 5 MG tablet Take 1 tablet (5 mg total) by mouth daily. (Patient not taking: Reported on 05/13/2020)  . [DISCONTINUED] cefdinir (OMNICEF) 300 MG capsule Take 1 capsule (300 mg total) by mouth 2 (two) times daily. (Patient not taking: Reported on 05/13/2020)  . [DISCONTINUED] diltiazem (CARDIZEM CD) 120 MG 24 hr capsule Take 1 capsule (120 mg total) by mouth daily. (Patient not taking: Reported on 05/21/2020)   No facility-administered medications prior to visit.    Review of Systems  Constitutional: Negative for appetite change, chills, fatigue and fever.  Respiratory: Positive for chest tightness and shortness of breath. Negative for cough.   Cardiovascular: Positive for chest pain and palpitations.    Last CBC Lab Results  Component Value Date   WBC 12.5 (H) 05/01/2020   HGB 12.4 05/01/2020   HCT 38.9 05/01/2020   MCV 82.8 05/01/2020   MCH 26.4 05/01/2020   RDW 13.6 05/01/2020   PLT 409 (H) 09/10/1599   Last metabolic panel Lab Results  Component Value Date   GLUCOSE 162 (H) 05/01/2020   NA 133 (L) 05/01/2020   K 3.5 05/01/2020   CL 100 05/01/2020   CO2 23 05/01/2020   BUN 12 05/01/2020   CREATININE 1.01 (H) 05/01/2020   GFRNONAA >60 05/01/2020   GFRAA >60 11/26/2019   CALCIUM 9.5 05/01/2020   PROT 7.8 05/01/2020   ALBUMIN 4.5 05/01/2020   LABGLOB 2.7  11/11/2018   AGRATIO 1.5 11/11/2018   BILITOT 0.6 05/01/2020   ALKPHOS 98 05/01/2020   AST 20 05/01/2020   ALT 13 05/01/2020   ANIONGAP 10 05/01/2020   Last lipids Lab Results  Component Value Date   CHOL 183 01/13/2020   HDL 64 01/13/2020   LDLCALC 99 01/13/2020   TRIG 116 01/13/2020   CHOLHDL 2.9 01/13/2020       Objective    BP 106/76 (BP Location: Left Arm, Patient Position: Sitting, Cuff Size: Normal)   Pulse 76   Temp 98.6 F (37 C) (Oral)   Resp 16   Wt 143 lb (64.9 kg)   SpO2 98%   BMI 24.55 kg/m  BP Readings from Last 3 Encounters:  05/21/20 106/76  05/13/20 128/75  05/01/20 (!) 156/107   Wt Readings from Last 3 Encounters:  05/21/20 143 lb (64.9 kg)  05/13/20 139 lb (63 kg)  05/01/20 139 lb (63 kg)       Physical Exam Vitals reviewed.  Constitutional:      General: She is not in acute distress.    Appearance: Normal appearance. She is well-developed. She is not diaphoretic.  HENT:     Head: Normocephalic and atraumatic.  Eyes:     General: No scleral icterus.    Conjunctiva/sclera: Conjunctivae normal.  Neck:     Thyroid: No thyromegaly.  Cardiovascular:     Rate and Rhythm: Normal rate and regular rhythm.     Pulses: Normal pulses.     Heart sounds: Normal heart sounds. No murmur heard.   Pulmonary:     Effort: Pulmonary effort is normal. No respiratory distress.     Breath sounds: Normal breath sounds. No wheezing, rhonchi or rales.  Abdominal:     General: There is no distension.     Palpations: Abdomen is soft.     Tenderness: There is no abdominal tenderness.  Musculoskeletal:     Cervical back: Neck supple.     Right lower leg: No edema.     Left lower leg: No edema.  Lymphadenopathy:     Cervical: No cervical adenopathy.  Skin:    General: Skin is warm and dry.     Findings: No rash.  Neurological:     Mental Status: She is alert and oriented to person, place, and time. Mental status is at baseline.       Results for  orders placed or performed in visit on 05/21/20  POCT urinalysis dipstick  Result Value Ref Range   Color, UA yellow    Clarity, UA clear    Glucose, UA Negative Negative   Bilirubin, UA Negative    Ketones, UA Negative    Spec Grav, UA 1.025 1.010 - 1.025   Blood, UA Small    pH, UA 6.0 5.0 - 8.0   Protein, UA Negative Negative   Urobilinogen, UA 0.2 0.2 or 1.0 E.U./dL   Nitrite, UA Negative    Leukocytes, UA Negative Negative   Appearance dark     Assessment & Plan     Problem List Items Addressed This Visit      Respiratory   OSA (obstructive sleep apnea)    Chronic and well-controlled Using CPAP with good compliance, which we will continue We can send this documentation to her servicer for her CPAP if needed       Other Visit Diagnoses    Dysuria    -  Primary   Relevant Orders   POCT urinalysis dipstick (Completed)   Urine Microscopic   Urine Culture    -UTI seems to have resolved with no leuks or nitrites on UA today -Because she is continue to have some symptoms, however, we will send urine culture -Given blood on urine dipstick, will send urine microscopic to confirm as well   No follow-ups on file.      I, Lavon Paganini, MD, have reviewed all documentation for this visit. The documentation on 05/21/20 for the exam, diagnosis, procedures, and orders are all accurate and complete.   Ladell Bey, Dionne Bucy, MD, MPH [facility]

## 2020-05-21 NOTE — Assessment & Plan Note (Signed)
Chronic and well-controlled Using CPAP with good compliance, which we will continue We can send this documentation to her servicer for her CPAP if needed

## 2020-05-21 NOTE — Telephone Encounter (Signed)
Left detailed message for patient.

## 2020-05-22 LAB — URINALYSIS, MICROSCOPIC ONLY
Bacteria, UA: NONE SEEN
Casts: NONE SEEN /lpf
RBC, Urine: NONE SEEN /hpf (ref 0–2)

## 2020-05-23 LAB — URINE CULTURE

## 2020-05-24 DIAGNOSIS — G4733 Obstructive sleep apnea (adult) (pediatric): Secondary | ICD-10-CM | POA: Diagnosis not present

## 2020-05-25 ENCOUNTER — Telehealth: Payer: Self-pay

## 2020-05-25 ENCOUNTER — Telehealth: Payer: Self-pay | Admitting: Cardiology

## 2020-05-25 NOTE — Telephone Encounter (Signed)
Patient called and stated that her appointment in our [place] office was cancelled . Please advise if patient needs to be seen before next week. Patient did not reschedule due to her follow up appointment with Dr. Garen Lah is next week.

## 2020-05-25 NOTE — Telephone Encounter (Signed)
Dr. 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 Her appointment was cancelled for tomorrow with EP in [place]. I also saw she got scheduled with an APP and should probably be seen by an MD to evaluate for a pacemaker.   Upon further review, I see the patient had been seen by Dr. Caryl Comes on 06/09/20 for Paroxysmal A-fib. I will forward this to [facility], Dr. Olin Pia nurse to see if we can get this patient in next week sometime.

## 2020-05-25 NOTE — Telephone Encounter (Signed)
I called pt and pt verbalized understanding of information below. Pt said she was asymptomatic.

## 2020-05-25 NOTE — Telephone Encounter (Signed)
-----   Message from Virginia Crews, MD sent at 05/25/2020  1:12 PM EDT ----- Negative culture (no infection). No blood on microscope exam. There are some calcium oxalate crystals in the urine that could reflect kidney stones, but if asymptomatic, nothing to do for this now.

## 2020-05-26 ENCOUNTER — Encounter: Payer: Self-pay | Admitting: Cardiology

## 2020-05-26 ENCOUNTER — Telehealth: Payer: Self-pay | Admitting: Cardiology

## 2020-05-26 ENCOUNTER — Other Ambulatory Visit: Payer: Self-pay

## 2020-05-26 ENCOUNTER — Ambulatory Visit (INDEPENDENT_AMBULATORY_CARE_PROVIDER_SITE_OTHER): Payer: Medicare Other | Admitting: Cardiology

## 2020-05-26 ENCOUNTER — Ambulatory Visit: Payer: Medicare Other | Admitting: Student

## 2020-05-26 VITALS — BP 142/90 | HR 102 | Ht 64.0 in | Wt 142.8 lb

## 2020-05-26 DIAGNOSIS — Z8659 Personal history of other mental and behavioral disorders: Secondary | ICD-10-CM | POA: Diagnosis not present

## 2020-05-26 DIAGNOSIS — I480 Paroxysmal atrial fibrillation: Secondary | ICD-10-CM

## 2020-05-26 DIAGNOSIS — R42 Dizziness and giddiness: Secondary | ICD-10-CM | POA: Diagnosis not present

## 2020-05-26 NOTE — Patient Instructions (Signed)
Medication Instructions:  Your physician recommends that you continue on your current medications as directed. Please refer to the Current Medication list given to you today. *If you need a refill on your cardiac medications before your next appointment, please call your pharmacy*  Lab Work: None ordered. If you have labs (blood work) drawn today and your tests are completely normal, you will receive your results only by: Marland Kitchen MyChart Message (if you have MyChart) OR . A paper copy in the mail If you have any lab test that is abnormal or we need to change your treatment, we will call you to review the results.  Testing/Procedures: None ordered.  Follow-Up: At [facility], you and your health needs are our priority.  As part of our continuing mission to provide you with exceptional heart care, we have created designated Provider Care Teams.  These Care Teams include your primary Cardiologist (physician) and Advanced Practice Providers (APPs -  Physician Assistants and Nurse Practitioners) who all work together to provide you with the care you need, when you need it.  Your next appointment:   Your physician wants you to follow-up as scheduled with Dr. Caryl Comes

## 2020-05-26 NOTE — Progress Notes (Signed)
Electrophysiology Office Follow up Visit Note:    Date:  05/26/2020   ID:  ARIABELLA BRIEN, DOB 1960/05/14, MRN [id number]  PCP:  Virginia Crews, MD  [facility] Cardiologist:  Kate Sable, MD  [facility] Electrophysiologist: Jolyn Nap, MD   Interval History:    ALAYNA MABE is a 60 y.o. female who presents for a follow up visit.  The patient is a longtime patient of Dr. Jolyn Nap. Today during my clinic day the triage nurse contacted me about Mrs. Yolanda Bonine.  The patient awoke from sleep last night with acute onset chest pain, nausea and chills.  Symptoms lasted several minutes.  During my clinic visit today she tells me that the symptoms felt like a hot poker stabbing her in the chest.  The pain radiated to her jaw.  She was lightheaded and dizzy.  She denied any syncope.  She has not been able to get a bed all day.  Given the symptoms, I called to speak with the patient.  She did not sound well on the phone and I recommended that she drive in from her home to be seen by me at the end of the day.  Today in clinic we had a long conversation about her symptoms.  It seems that her symptoms have been going on for months.  We pulled up her recent ZIO monitor.  During the ZIO monitoring.  She had similar symptoms that corresponded to sinus tachycardia and sinus rhythm.  It seems that she struggles with anxiety and depression and she is concerned it may be these diagnoses are contributing to her symptoms.    Past Medical History:  Diagnosis Date  . A-fib ([place]) 2021  . Abnormal EKG    HX OF INVERTED T WAVES ON EKG, PALPITATIONS, CHEST PAINS-CARDIAC WORK UP DID NOT SHOW ANY HEART DISEASE  . AC (acromioclavicular) joint bone spurs   . Acute postoperative pain 01/04/2017  . 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 Anemia    Iron Infusion-8 yrs ago  . Anxiety   . Asthma   . Cephalalgia 08/18/2014  . Cervical disc disease 08/18/2014   Needs neck surgery.   . Chronic headaches   .  Chronic, continuous use of opioids   . DDD (degenerative disc disease)    CERVICAL AND LUMBAR-CHRONIC PAIN, RT HIP LABRAL TEAR  . Depression    PT STATES A LOT OF STRESS IN HER LIFE  . Dissociative disorder   . Dizziness 04/22/2013  . Duodenal ulcer with hemorrhage and perforation ([place]) 04/27/2003  . Foot drop, right    FROM BACK SURGERY  . GERD (gastroesophageal reflux disease)   . H/O arthrodesis 08/18/2014  . Headache(784.0)    AND NECK PAIN--STATES RECENT TEST SHOW CERVICAL DEGENERATION  . History of blood transfusion    s/p back surgery  . History of cardioversion   . History of cervical spinal surgery 01/04/2015  . History of kidney stones   . Hypertension   . Inverted T wave   . Narrowing of intervertebral disc space 08/18/2014   Currently on disability.   . Orthostatic hypotension 04/22/2013  . Pain    CHRONIC NECK AND BACK PAIN - LIMITED ROM NECK - S/P FUSIONS CERVICAL AND LUMBAR  . Pneumonia   . PONV (postoperative nausea and vomiting)    PT GIVES HX OF N&V AND FEVER WITH SURGERIES YEARS AGO--BUT NO PROBLEMS WITH MORE RECENT SURGERIES--STATES NOT MALIGNANT HYPERTHERMIA  . Postop Hyponatremia 05/14/2012  . Postoperative anemia due  to acute blood loss 05/14/2012  . PTSD (post-traumatic stress disorder)   . Right foot drop   . 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   . Sleep apnea 2021   does not have a cpap  . Sleep apnea   . Therapeutic opioid-induced constipation (OIC)     Past Surgical History:  Procedure Laterality Date  . ABDOMINAL HYSTERECTOMY    . afib  05/2019  . ANTERIOR CERVICAL DECOMP/DISCECTOMY FUSION N/A 10/30/2012   Procedure: ACDF C5-6, EXPLORATION AND HARDWARE REMOVAL C6-7;  Surgeon: Melina Schools, MD;  Location: [place];  Service: Orthopedics;  Laterality: N/A;  . ANTERIOR FUSION CERVICAL SPINE  MAY 2012   AT [facility]  . BACK SURGERY  2009   LUMBAR FUSION WITH RODS   . BREAST BIOPSY Right 2008   benign.- bx/clip  .  BREAST EXCISIONAL BIOPSY Left 1998   benign  . BREAST REDUCTION SURGERY Bilateral 06/2016  . CARDIOVERSION N/A 12/03/2019   Procedure: CARDIOVERSION;  Surgeon: Kate Sable, MD;  Location: [facility];  Service: Cardiovascular;  Laterality: N/A;  . CARPAL TUNNEL RELEASE  05-06-12   Right  . CHOLECYSTECTOMY    . COLONOSCOPY WITH PROPOFOL N/A 01/26/2017   Procedure: COLONOSCOPY WITH PROPOFOL;  Surgeon: Lucilla Lame, MD;  Location: [place];  Service: Endoscopy;  Laterality: N/A;  . DIAGNOSTIC LAPAROSCOPIES - MULTIPLE FOR ENDOMETRIOSIS    . ESOPHAGEAL DILATION N/A 01/26/2017   Procedure: ESOPHAGEAL DILATION;  Surgeon: Lucilla Lame, MD;  Location: [place];  Service: Endoscopy;  Laterality: N/A;  . ESOPHAGEAL MANOMETRY N/A 05/30/2017   Procedure: ESOPHAGEAL MANOMETRY (EM);  Surgeon: Lucilla Lame, MD;  Location: [facility];  Service: Endoscopy;  Laterality: N/A;  . ESOPHAGOGASTRODUODENOSCOPY (EGD) WITH PROPOFOL N/A 01/26/2017   Procedure: ESOPHAGOGASTRODUODENOSCOPY (EGD) WITH PROPOFOL;  Surgeon: Lucilla Lame, MD;  Location: [place];  Service: Endoscopy;  Laterality: N/A;  . ESOPHAGOGASTRODUODENOSCOPY (EGD) WITH PROPOFOL N/A 01/30/2020   Procedure: ESOPHAGOGASTRODUODENOSCOPY (EGD) WITH PROPOFOL;  Surgeon: Lucilla Lame, MD;  Location: [place];  Service: Endoscopy;  Laterality: N/A;  sleep apnea COVID + 01-15-20  . HIP ARTHROSCOPY  09/20/2011   Procedure: ARTHROSCOPY HIP;  Surgeon: Gearlean Alf, MD;  [facility]: [facility];  Service: Orthopedics;  Laterality: Right;  Right Hip Scope with Labral Debridement  . LEFT HEART CATH AND CORONARY ANGIOGRAPHY Left 06/10/2019   Procedure: LEFT HEART CATH AND CORONARY ANGIOGRAPHY;  Surgeon: Nelva Bush, MD;  Location: [place] [facility];  Service: Cardiovascular;  Laterality: Left;  . NASAL SEPTUM SURGERY  MARCH 2013   IN [place]  . [place] IMPEDANCE STUDY N/A 05/30/2017   Procedure: [place] IMPEDANCE STUDY;   Surgeon: Lucilla Lame, MD;  Location: [facility];  Service: Endoscopy;  Laterality: N/A;  . POLYPECTOMY N/A 01/26/2017   Procedure: POLYPECTOMY;  Surgeon: Lucilla Lame, MD;  Location: [place];  Service: Endoscopy;  Laterality: N/A;  . POSTERIOR CERVICAL FUSION/FORAMINOTOMY N/A 04/16/2013   Procedure: REMOVAL CERVICAL PLATES AND INTERBODY CAGE/POSTERIOR CERVICAL SPINAL FUSION C4 - C6/C5 CORPECTOMY/C4 - C6 FUSION WITH ILIAC CREST BONE GRAFT;  Surgeon: Melina Schools, MD;  Location: [place];  Service: Orthopedics;  Laterality: N/A;  . RADIOFREQUENCY ABLATION NERVES    . REDUCTION MAMMAPLASTY Bilateral 05/2016  . RIGHT HIP ARTHROSCOPY FOR LABRAL TEAR  ABOUT 2010   2012 also  . SHOULDER ARTHROSCOPY  05-06-12   bone spur  . TONSILLECTOMY     AS A CHILD  .  TOTAL HIP ARTHROPLASTY Right 05/13/2012   Procedure: TOTAL HIP ARTHROPLASTY ANTERIOR APPROACH;  Surgeon: Gearlean Alf, MD;  [facility];  Service: Orthopedics;  Laterality: Right;    Current Medications: Current Meds  Medication Sig  . acetaminophen (TYLENOL) 500 MG tablet Take 500-1,000 mg by mouth every 6 (six) hours as needed for moderate pain.   Marland Kitchen ALPRAZolam (XANAX) 1 MG tablet Take 1 mg by mouth at bedtime.   Marland Kitchen amiodarone (PACERONE) 200 MG tablet Take 1 tablet (200 mg total) by mouth daily.  Marland Kitchen apixaban (ELIQUIS) 5 MG TABS tablet Take 1 tablet (5 mg total) by mouth 2 (two) times daily.  . busPIRone (BUSPAR) 7.5 MG tablet Take 7.5 mg by mouth 2 (two) times daily.  Marland Kitchen HYDROcodone-acetaminophen (NORCO) 10-325 MG tablet Take 0.5 tablets by mouth every 8 (eight) hours as needed for up to 14 days for severe pain. Must last 30 days.  Marland Kitchen linaclotide (LINZESS) 290 MCG CAPS capsule Take 1 capsule (290 mcg total) by mouth daily before breakfast.  . Menthol, Topical Analgesic, (BENGAY EX) Apply 1 application topically daily as needed (pain).  . montelukast (SINGULAIR) 10 MG tablet TAKE 1 TABLET BY MOUTH EVERY DAY  . morphine (MSIR) 15  MG tablet Take 1 tablet (15 mg total) by mouth every 6 (six) hours as needed for moderate pain or severe pain. Must last 30 days.  Derrill Memo ON 06/20/2020] morphine (MSIR) 15 MG tablet Take 1 tablet (15 mg total) by mouth every 6 (six) hours as needed for moderate pain or severe pain. Must last 30 days.  . ondansetron (ZOFRAN ODT) 4 MG disintegrating tablet Take 1 tablet (4 mg total) by mouth every 8 (eight) hours as needed for nausea or vomiting.  . pantoprazole (PROTONIX) 40 MG tablet TAKE 1 TABLET BY MOUTH TWICE A DAY  . polyethylene glycol (MIRALAX / GLYCOLAX) 17 g packet Take 17 g by mouth daily as needed.  . valACYclovir (VALTREX) 1000 MG tablet TAKE TWO TABLETS BY MOUTH TWICE DAILY FOR ONE DAY FEVER BLISTER     Allergies:   Amoxicillin, Chlorhexidine gluconate, Clindamycin, Codeine, Erythromycin, Penicillin g, Sulfa antibiotics, Levofloxacin, Shellfish allergy, Decadron [dexamethasone], Mangifera indica, Papaya derivatives, Betadine [povidone iodine], Clarithromycin, Other, Povidone-iodine, and Prednisone   Social History   Socioeconomic History  . Marital status: Married    Spouse name: bruce  . Number of children: 2  . Years of education: Not on file  . Highest education level: Associate degree: occupational, Hotel manager, or vocational program  Occupational History    Comment: disabled  Tobacco Use  . Smoking status: Never Smoker  . Smokeless tobacco: Never Used  Vaping Use  . Vaping Use: Never used  Substance and Sexual Activity  . Alcohol use: No    Alcohol/week: 0.0 standard drinks  . Drug use: Yes    Comment: prescribed morphine and xanax  . Sexual activity: Yes  Other Topics Concern  . Not on file  Social History Narrative   Son, daughter  And 4 grandkids; raises 3 of the grandchildren   Social Determinants of Health   Financial Resource Strain: High Risk  . Difficulty of Paying Living Expenses: Very hard  Food Insecurity: Food Insecurity Present  . Worried About  Charity fundraiser in the Last Year: Sometimes true  . Ran Out of Food in the Last Year: Often true  Transportation Needs: Unmet Transportation Needs  . Lack of Transportation (Medical): Yes  . Lack of Transportation (Non-Medical): Yes  Physical  Activity: Inactive  . Days of Exercise per Week: 0 days  . Minutes of Exercise per Session: 0 min  Stress: Stress Concern Present  . Feeling of Stress : Very much  Social Connections: Moderately Isolated  . Frequency of Communication with Friends and Family: More than three times a week  . Frequency of Social Gatherings with Friends and Family: Never  . Attends Religious Services: Never  . Active Member of Clubs or Organizations: No  . Attends Archivist Meetings: Never  . Marital Status: Married     Family History: The patient's family history includes Aneurysm in her maternal grandmother and mother; Anxiety disorder in her grandchild; Bipolar disorder in her grandchild and sister; Breast cancer (age of onset: 68) in her paternal grandmother; Depression in her grandchild.  ROS:   Please see the history of present illness.    All other systems reviewed and are negative.  EKGs/Labs/Other Studies Reviewed:    The following studies were reviewed today:  May 20, 2020 ZIO personally reviewed Heart rate 35-1 52, average 59 bpm Frequent paroxysmal SVTs and episodes of atrial fibrillation Sinus bradycardia with ventricular rates in the 30s noted mostly during rest.  EKG:  The ekg ordered today demonstrates sinus tachycardia with a ventricular rate of 102 bpm.  Narrow QRS.  PR interval 156 ms.  QTc normal.  Recent Labs: 07/10/2019: B Natriuretic Peptide 395.0; TSH 1.424 05/01/2020: ALT 13; BUN 12; Creatinine, Ser 1.01; Hemoglobin 12.4; Platelets 409; Potassium 3.5; Sodium 133  Recent Lipid Panel    Component Value Date/Time   CHOL 183 01/13/2020 1117   TRIG 116 01/13/2020 1117   HDL 64 01/13/2020 1117   CHOLHDL 2.9 01/13/2020  1117   LDLCALC 99 01/13/2020 1117    Physical Exam:    VS:  BP (!) 142/90   Pulse (!) 102   Ht 5\' 4"  (1.626 m)   Wt 142 lb 12.8 oz (64.8 kg)   SpO2 97%   BMI 24.51 kg/m     Wt Readings from Last 3 Encounters:  05/26/20 142 lb 12.8 oz (64.8 kg)  05/21/20 143 lb (64.9 kg)  05/13/20 139 lb (63 kg)     GEN:  Well nourished, well developed in no acute distress HEENT: Normal NECK: No JVD; No carotid bruits LYMPHATICS: No lymphadenopathy CARDIAC: rrr, no murmurs, rubs, gallops RESPIRATORY:  Clear to auscultation without rales, wheezing or rhonchi  ABDOMEN: Soft, non-tender, non-distended MUSCULOSKELETAL:  No edema; No deformity  SKIN: Warm and dry NEUROLOGIC:  Alert and oriented x 3 PSYCHIATRIC:  Normal affect   ASSESSMENT:    1. Lightheadedness   2. Paroxysmal A-fib ([place])   3. Hx of anxiety disorder    PLAN:    In order of problems listed above:  1. Chest pain, palpitations, lightheadedness After extensive review of her records and recent ZIO monitor it seems that her symptoms are not related to abnormal heart rhythms.  The patient is concerned that these symptoms are related to her anxiety and depression.  Given the chronicity of the symptoms I think this is a very real possibility.  Right now she is taking bupropion prescribed by psychiatrist.  I have asked her to discuss this therapy with her psychiatrist at her follow-up appointment next month to see if there are are additional/alternative anxiety and depression agents that could be used.   2.  PSVT/paroxysmal A. fib Continue amiodarone and Eliquis.  Patient has a follow-up next week with Jolyn Nap to discuss this further.  3.  History of anxiety depression I suspect these are likely driving many of her symptoms.     Medication Adjustments/Labs and Tests Ordered: Current medicines are reviewed at length with the patient today.  Concerns regarding medicines are outlined above.  Orders Placed This Encounter   Procedures  . EKG 12-Lead   No orders of the defined types were placed in this encounter.    Signed, Lars Mage, MD, [facility]  05/26/2020 8:51 PM    Electrophysiology [facility]

## 2020-05-26 NOTE — Telephone Encounter (Signed)
Called to speak with patient.  Reviewed results of ZIO monitor.  Patient with exertional lightheadedness and dizziness.  I am concerned that she may be having resting AV block.  I would like to bring her in for a clinic visit this afternoon as an add-on.  We will plan to get an EKG during that visit.  I called and discussed this with the patient and she will had this way.  Lysbeth Galas T. Quentin Ore, MD, [facility] Cardiac Electrophysiology

## 2020-05-26 NOTE — Telephone Encounter (Signed)
Dr. Garen Lah reviewed the patient's monitor on 05/20/20. Per MD:  Kate Sable, MD  05/20/2020 1:17 PM EST      Paroxysmal SVTs, episodes of A. fib noted. Sinus bradycardia noted with heart rates in the 30s. Stop Cardizem. Refer patient to EP (ASAP) to evaluate for possible tachybradycardia syndrome. Thank you      Secure chat message sent to Dr. Quentin Ore today to please review the patient's monitor for urgency on a follow up appointment. She was scheduled to see [place], [place] (EP) today in [place], but the appt was cancelled due to provider illness.   Secure chat message received back from Dr. Quentin Ore: It depends on symptoms. If she is asymptomatic, can wait a week or so. If she is feeling poorly, I can see her as an add on this afternoon.

## 2020-05-26 NOTE — Telephone Encounter (Signed)
Discussed the patient's symptoms verbally with Dr. Quentin Ore- per MD ok to wait until next week to be seen due to no syncope.  I have notified the patient of MD recommendations. She is agreeable with an appointment with Dr. Caryl Comes on Tuesday 06/01/20 at [place]. The patient voices understanding.  She is aware the [facility] is now open as well.

## 2020-05-26 NOTE — Telephone Encounter (Signed)
I called and spoke with the patient to inquire as to how she is feeling.  Per the patient, she was woken from sleep last night with acute onset of chest pain, nausea, & chills. She states her symptoms lasted about 8-10 minutes and she felt like "a hot poker was stabbing me in the chest and going up my neck & jaw." She has overall been feeling "washed out," with intermittent dizziness/ lightheadedness. She denies any recent episodes of syncope.  She is currently resting in bed today. She has not eaten as she has no appetite.   I advised the patient I will review the message above with Dr. Quentin Ore and call her back today. I advised we may need to work her in today in [place], but if Dr. Quentin Ore is ok with her waiting, I will find a work in spot for her on Tuesday 3/22 with Dr. Caryl Comes in [place].  The patient voices understanding and is agreeable.

## 2020-06-01 ENCOUNTER — Encounter: Payer: Self-pay | Admitting: Internal Medicine

## 2020-06-01 ENCOUNTER — Other Ambulatory Visit: Payer: Self-pay

## 2020-06-01 ENCOUNTER — Ambulatory Visit: Payer: Medicare Other | Admitting: Internal Medicine

## 2020-06-01 VITALS — BP 130/90 | HR 59 | Ht 64.0 in | Wt 142.2 lb

## 2020-06-01 DIAGNOSIS — I471 Supraventricular tachycardia: Secondary | ICD-10-CM | POA: Diagnosis not present

## 2020-06-01 DIAGNOSIS — I480 Paroxysmal atrial fibrillation: Secondary | ICD-10-CM

## 2020-06-01 DIAGNOSIS — I10 Essential (primary) hypertension: Secondary | ICD-10-CM | POA: Diagnosis not present

## 2020-06-01 DIAGNOSIS — I483 Typical atrial flutter: Secondary | ICD-10-CM | POA: Diagnosis not present

## 2020-06-01 NOTE — Patient Instructions (Signed)
Medication Instructions:  - Your physician recommends that you continue on your current medications as directed. Please refer to the Current Medication list given to you today.  *If you need a refill on your cardiac medications before your next appointment, please call your pharmacy*   Lab Work: - none ordered  If you have labs (blood work) drawn today and your tests are completely normal, you will receive your results only by: Marland Kitchen MyChart Message (if you have MyChart) OR . A paper copy in the mail If you have any lab test that is abnormal or we need to change your treatment, we will call you to review the results.   Testing/Procedures: - none ordered   Follow-Up: At [facility], you and your health needs are our priority.  As part of our continuing mission to provide you with exceptional heart care, we have created designated Provider Care Teams.  These Care Teams include your primary Cardiologist (physician) and Advanced Practice Providers (APPs -  Physician Assistants and Nurse Practitioners) who all work together to provide you with the care you need, when you need it.  We recommend signing up for the patient portal called "MyChart".  Sign up information is provided on this After Visit Summary.  MyChart is used to connect with patients for Virtual Visits (Telemedicine).  Patients are able to view lab/test results, encounter notes, upcoming appointments, etc.  Non-urgent messages can be sent to your provider as well.   To learn more about what you can do with MyChart, go to [email or web address].    Your next appointment:   Pending Dr. Olin Pia conversation with Dr. Quentin Ore   The format for your next appointment:   In Person  Provider:   as above   Other Instructions n/a

## 2020-06-01 NOTE — Progress Notes (Signed)
Patient Care Team: Virginia Crews, MD as PCP - General (Family Medicine) Kate Sable, MD as PCP - Cardiology (Cardiology) Sisk, Montine Circle, NP as Nurse Practitioner (Psychiatry) Lucilla Lame, MD as Consulting Physician (Gastroenterology) Jules Husbands, MD as Consulting Physician (General Surgery) Milinda Pointer, MD as Referring Physician (Pain Medicine) Pcp, No Coralyn Helling, DMD (Dentistry)   HPI  [name] is a 60 y.o. female seen in followup for Afib paroxysmal  Rx with amio and anticoagulation with Apixoban started 9/21 ([facility]) for atrial fib ( called flutter- but coarse afib)   Seen recently by CL for CP LH, initially thought to be acute, but on closer hx going on for months; prior ZIO >> symptoms assoc with sinus tach and sinus ? 2/2 anxiety/depression --  recurrent chest pain is associated with palpitations and these are as outlined from Dr. Mardene Speak notes associated with sinus rhythm occasionally with simple atrial ectopy \ Also reviewing the monitor with the patient, demonstrated bradycardia with 2: 1 heart block associated with Wenckebach physiology occurring both at night in the middle of the day.  She has treated sleep apnea.  Sometimes she falls asleep during the day without her CPAP on.  She notes her stress is related to her son living at home who is on drugs  DATE TEST EF   2/15 Echo   60-65 %    3/21 LHC  No obst CAD  4/21 Echo  55-60% LAE mild    Thromboembolic risk factors (, HTN-1,  Gender-1) for a CHADSVASc Score of >= 2  Date Cr K Hgb TSH LFTs  2/22 1.01 3.5 12.4   13           Patient denies symptoms of GI intolerance, sun sensitivity, neurological symptoms attributable to amiodarone.     Records and Results Reviewed   Past Medical History:  Diagnosis Date  . A-fib ([place]) 2021  . Abnormal EKG    HX OF INVERTED T WAVES ON EKG, PALPITATIONS, CHEST PAINS-CARDIAC WORK UP DID NOT SHOW ANY HEART DISEASE  . AC  (acromioclavicular) joint bone spurs   . Acute postoperative pain 01/04/2017  . 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 Anemia    Iron Infusion-8 yrs ago  . Anxiety   . Asthma   . Cephalalgia 08/18/2014  . Cervical disc disease 08/18/2014   Needs neck surgery.   . Chronic headaches   . Chronic, continuous use of opioids   . DDD (degenerative disc disease)    CERVICAL AND LUMBAR-CHRONIC PAIN, RT HIP LABRAL TEAR  . Depression    PT STATES A LOT OF STRESS IN HER LIFE  . Dissociative disorder   . Dizziness 04/22/2013  . Duodenal ulcer with hemorrhage and perforation ([place]) 04/27/2003  . Foot drop, right    FROM BACK SURGERY  . GERD (gastroesophageal reflux disease)   . H/O arthrodesis 08/18/2014  . Headache(784.0)    AND NECK PAIN--STATES RECENT TEST SHOW CERVICAL DEGENERATION  . History of blood transfusion    s/p back surgery  . History of cardioversion   . History of cervical spinal surgery 01/04/2015  . History of kidney stones   . Hypertension   . Inverted T wave   . Narrowing of intervertebral disc space 08/18/2014   Currently on disability.   . Orthostatic hypotension 04/22/2013  . Pain    CHRONIC NECK AND BACK PAIN - LIMITED ROM NECK - S/P FUSIONS CERVICAL AND LUMBAR  . Pneumonia   .  PONV (postoperative nausea and vomiting)    PT GIVES HX OF N&V AND FEVER WITH SURGERIES YEARS AGO--BUT NO PROBLEMS WITH MORE RECENT SURGERIES--STATES NOT MALIGNANT HYPERTHERMIA  . Postop Hyponatremia 05/14/2012  . Postoperative anemia due to acute blood loss 05/14/2012  . PTSD (post-traumatic stress disorder)   . Right foot drop   . 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   . Sleep apnea 2021   does not have a cpap  . Sleep apnea   . Therapeutic opioid-induced constipation ([facility])     Past Surgical History:  Procedure Laterality Date  . ABDOMINAL HYSTERECTOMY    . afib  05/2019  . ANTERIOR CERVICAL DECOMP/DISCECTOMY FUSION N/A 10/30/2012   Procedure: ACDF C5-6,  EXPLORATION AND HARDWARE REMOVAL C6-7;  Surgeon: Melina Schools, MD;  Location: [place];  Service: Orthopedics;  Laterality: N/A;  . ANTERIOR FUSION CERVICAL SPINE  MAY 2012   AT [facility]  . BACK SURGERY  2009   LUMBAR FUSION WITH RODS   . BREAST BIOPSY Right 2008   benign.- bx/clip  . BREAST EXCISIONAL BIOPSY Left 1998   benign  . BREAST REDUCTION SURGERY Bilateral 06/2016  . CARDIOVERSION N/A 12/03/2019   Procedure: CARDIOVERSION;  Surgeon: Kate Sable, MD;  Location: [facility];  Service: Cardiovascular;  Laterality: N/A;  . CARPAL TUNNEL RELEASE  05-06-12   Right  . CHOLECYSTECTOMY    . COLONOSCOPY WITH PROPOFOL N/A 01/26/2017   Procedure: COLONOSCOPY WITH PROPOFOL;  Surgeon: Lucilla Lame, MD;  Location: [place];  Service: Endoscopy;  Laterality: N/A;  . DIAGNOSTIC LAPAROSCOPIES - MULTIPLE FOR ENDOMETRIOSIS    . ESOPHAGEAL DILATION N/A 01/26/2017   Procedure: ESOPHAGEAL DILATION;  Surgeon: Lucilla Lame, MD;  Location: [place];  Service: Endoscopy;  Laterality: N/A;  . ESOPHAGEAL MANOMETRY N/A 05/30/2017   Procedure: ESOPHAGEAL MANOMETRY (EM);  Surgeon: Lucilla Lame, MD;  Location: [facility];  Service: Endoscopy;  Laterality: N/A;  . ESOPHAGOGASTRODUODENOSCOPY (EGD) WITH PROPOFOL N/A 01/26/2017   Procedure: ESOPHAGOGASTRODUODENOSCOPY (EGD) WITH PROPOFOL;  Surgeon: Lucilla Lame, MD;  Location: [place];  Service: Endoscopy;  Laterality: N/A;  . ESOPHAGOGASTRODUODENOSCOPY (EGD) WITH PROPOFOL N/A 01/30/2020   Procedure: ESOPHAGOGASTRODUODENOSCOPY (EGD) WITH PROPOFOL;  Surgeon: Lucilla Lame, MD;  Location: [place];  Service: Endoscopy;  Laterality: N/A;  sleep apnea COVID + 01-15-20  . HIP ARTHROSCOPY  09/20/2011   Procedure: ARTHROSCOPY HIP;  Surgeon: Gearlean Alf, MD;  Location: [facility];  Service: Orthopedics;  Laterality: Right;  Right Hip Scope with Labral Debridement  . LEFT HEART CATH AND CORONARY ANGIOGRAPHY Left 06/10/2019    Procedure: LEFT HEART CATH AND CORONARY ANGIOGRAPHY;  Surgeon: Nelva Bush, MD;  Location: [place] [facility];  Service: Cardiovascular;  Laterality: Left;  . NASAL SEPTUM SURGERY  MARCH 2013   IN [place]  . [place] IMPEDANCE STUDY N/A 05/30/2017   Procedure: [place] IMPEDANCE STUDY;  Surgeon: Lucilla Lame, MD;  Location: [facility];  Service: Endoscopy;  Laterality: N/A;  . POLYPECTOMY N/A 01/26/2017   Procedure: POLYPECTOMY;  Surgeon: Lucilla Lame, MD;  Location: [place];  Service: Endoscopy;  Laterality: N/A;  . POSTERIOR CERVICAL FUSION/FORAMINOTOMY N/A 04/16/2013   Procedure: REMOVAL CERVICAL PLATES AND INTERBODY CAGE/POSTERIOR CERVICAL SPINAL FUSION C4 - C6/C5 CORPECTOMY/C4 - C6 FUSION WITH ILIAC CREST BONE GRAFT;  Surgeon: Melina Schools, MD;  Location: [place];  Service: Orthopedics;  Laterality: N/A;  . RADIOFREQUENCY ABLATION NERVES    . REDUCTION MAMMAPLASTY Bilateral  05/2016  . RIGHT HIP ARTHROSCOPY FOR LABRAL TEAR  ABOUT 2010   2012 also  . SHOULDER ARTHROSCOPY  05-06-12   bone spur  . TONSILLECTOMY     AS A CHILD  . TOTAL HIP ARTHROPLASTY Right 05/13/2012   Procedure: TOTAL HIP ARTHROPLASTY ANTERIOR APPROACH;  Surgeon: Gearlean Alf, MD;  Location: [facility];  Service: Orthopedics;  Laterality: Right;    Current Meds  Medication Sig  . acetaminophen (TYLENOL) 500 MG tablet Take 500-1,000 mg by mouth every 6 (six) hours as needed for moderate pain.   Marland Kitchen ALPRAZolam (XANAX) 1 MG tablet Take 1 mg by mouth at bedtime.   Marland Kitchen amiodarone (PACERONE) 200 MG tablet Take 1 tablet (200 mg total) by mouth daily.  Marland Kitchen apixaban (ELIQUIS) 5 MG TABS tablet Take 1 tablet (5 mg total) by mouth 2 (two) times daily.  . busPIRone (BUSPAR) 7.5 MG tablet Take 7.5 mg by mouth 2 (two) times daily.  Marland Kitchen HYDROcodone-acetaminophen (NORCO) 10-325 MG tablet Take 0.5 tablets by mouth every 8 (eight) hours as needed for up to 14 days for severe pain. Must last 30 days.  Marland Kitchen linaclotide (LINZESS) 290 MCG CAPS  capsule Take 1 capsule (290 mcg total) by mouth daily before breakfast.  . Menthol, Topical Analgesic, (BENGAY EX) Apply 1 application topically daily as needed (pain).  . montelukast (SINGULAIR) 10 MG tablet TAKE 1 TABLET BY MOUTH EVERY DAY  . morphine (MSIR) 15 MG tablet Take 1 tablet (15 mg total) by mouth every 6 (six) hours as needed for moderate pain or severe pain. Must last 30 days.  Marland Kitchen morphine (MSIR) 15 MG tablet Take 1 tablet (15 mg total) by mouth every 6 (six) hours as needed for moderate pain or severe pain. Must last 30 days.  Derrill Memo ON 06/20/2020] morphine (MSIR) 15 MG tablet Take 1 tablet (15 mg total) by mouth every 6 (six) hours as needed for moderate pain or severe pain. Must last 30 days.  . ondansetron (ZOFRAN ODT) 4 MG disintegrating tablet Take 1 tablet (4 mg total) by mouth every 8 (eight) hours as needed for nausea or vomiting.  . pantoprazole (PROTONIX) 40 MG tablet TAKE 1 TABLET BY MOUTH TWICE A DAY  . polyethylene glycol (MIRALAX / GLYCOLAX) 17 g packet Take 17 g by mouth daily as needed.  . valACYclovir (VALTREX) 1000 MG tablet TAKE TWO TABLETS BY MOUTH TWICE DAILY FOR ONE DAY FEVER BLISTER    Allergies  Allergen Reactions  . Amoxicillin Hives    She did ok w ANCEF  . Chlorhexidine Gluconate Dermatitis and Hives  . Clindamycin Hives  . Codeine Hives  . Erythromycin Hives    "mycins" in general  . Penicillin G Hives    "cillins" in general  . Sulfa Antibiotics Nausea And Vomiting and Hives       . Levofloxacin Hives  . Shellfish Allergy Hives  . Decadron [Dexamethasone] Other (See Comments)    Hot flashes, insomnia, "manic" Hot flashes, insomnia, "manic"  . Mangifera Indica Hives    papaya  . Papaya Derivatives Hives  . Betadine [Povidone Iodine] Hives       . Clarithromycin Hives  . Other Hives     Mango   . Povidone-Iodine Hives  . Prednisone Anxiety    High blood pressure, flushed, mood changes, heart palpitations High blood pressure, flushed,  mood changes, heart palpitations      Review of Systems negative except from HPI and PMH  Physical Exam BP 130/90 (  BP Location: Left Arm, Patient Position: Sitting, Cuff Size: Normal)   Pulse (!) 59   Ht 5\' 4"  (1.626 m)   Wt 142 lb 4 oz (64.5 kg)   SpO2 98%   BMI 24.42 kg/m  Well developed and well nourished in no acute distress HENT normal E scleral and icterus clear Neck Supple JVP flat; carotids brisk and full Clear to ausculation  Regular rate and rhythm, no murmurs gallops or rub Soft with active bowel sounds No clubbing cyanosis  Edema Alert and oriented, grossly normal motor and sensory function Skin Warm and Dry  ECG sinus at 59 Intervals 13/09/44  CrCl cannot be calculated (Patient's most recent lab result is older than the maximum 21 days allowed.).   Assessment and  Plan  Atrial fibrillation-persistent  Atrial tachycardia-nonsustained  High Risk Medication Surveillance   Bradycardia, 2: 1 AV block associated with Wenckebach physiology presumed related to sleep apnea  Chest pain-crescendo consistent with angina  Dyspnea on exertion/orthopnea  Psychosocial stress-significant  Hypertension  Asthma  GE reflux disease  Nissen fundoplication   The patient has significant psychosocial stress related to her son's drug problem.  This is having an impact on multiple symptoms.  Her chest pain is almost certainly noncardiac given the negative catheterization 1 year ago.  Associated palpitations are mostly sinus rhythm supporting Dr. Mardene Speak impression that they are related to anxiety/depression.  However, she is also had symptomatic persistent atrial fibrillation and was started on amiodarone.  For lady this young, I would be disinclined to continue this course and have suggested that we pursue an alternative rhythm control strategy either drug or ablation.  I will reach out to Dr. Quentin Ore to have him consider her for catheter ablation as the next step  as opposed to long-term drug therapy in this young lady.  Moreover, given her CHA2DS2-VASc score of only 2 (blood pressure and gender) I would be inclined following ablation to discontinue anticoagulation altogether.  She has not had her amiodarone surveillance labs done, specifically her TSH.  As we are going to be making a decision in the next days regarding the timing of discontinuing amiodarone we will not check that today.  Have encouraged her in regards to her son for her and her husband to consider Narc Anon  Current medicines are reviewed at length with the patient today .  The patient does not  have concerns regarding medicines.

## 2020-06-03 ENCOUNTER — Ambulatory Visit: Payer: Medicare Other | Admitting: Cardiology

## 2020-06-07 ENCOUNTER — Telehealth: Payer: Self-pay | Admitting: *Deleted

## 2020-06-07 NOTE — Telephone Encounter (Signed)
To scheduling to arrange for an appointment with Dr. Quentin Ore in the [place] office to discuss possible a-fib ablation.

## 2020-06-07 NOTE — Telephone Encounter (Signed)
Scheduled 4/20 [name]. Declined sooner [place].  Added to waitlist.   Patient wants Dr. Caryl Comes to know - She did it ( states he will know )

## 2020-06-07 NOTE — Telephone Encounter (Signed)
-----   Message from Vickie Epley, MD sent at 06/06/2020 10:39 AM EDT ----- Happy to see her again to discuss. Nira Conn, can you schedule her with me in [place]? Thanks, CL  ----- Message ----- From: Deboraha Sprang, MD Sent: 06/04/2020   1:41 PM EDT To: Emily Filbert, RN, Vickie Epley, MD  [facility]  WANTED to get back up with you regarding your thoguht on ablation as the next step in this young ladies care-- she would like to stop meds and was started on amio at [facility] last fall--  We will stop now and wash out but would like to offer her next steps as well  Thanks  ----- Message ----- From: Emily Filbert, RN Sent: 06/04/2020   9:42 AM EDT To: Deboraha Sprang, MD, Emily Filbert, RN  Did you talk to Dr. Quentin Ore about the possibility of ablating her a-fib?   Just trying to see which way to go with her since you wanted to get her off the amio!

## 2020-06-08 NOTE — Telephone Encounter (Signed)
Noted- to Dr. Caryl Comes.

## 2020-06-09 ENCOUNTER — Other Ambulatory Visit: Payer: Self-pay | Admitting: Physician Assistant

## 2020-06-09 DIAGNOSIS — G4733 Obstructive sleep apnea (adult) (pediatric): Secondary | ICD-10-CM | POA: Diagnosis not present

## 2020-06-15 DIAGNOSIS — G8929 Other chronic pain: Secondary | ICD-10-CM | POA: Diagnosis not present

## 2020-06-15 DIAGNOSIS — M542 Cervicalgia: Secondary | ICD-10-CM | POA: Diagnosis not present

## 2020-06-16 ENCOUNTER — Telehealth: Payer: Self-pay | Admitting: Cardiology

## 2020-06-16 NOTE — Telephone Encounter (Signed)
   [facility] Pre-operative Risk Assessment    Patient Name: [name]  DOB: [birth year]  MRN: [id number]   HEARTCARE STAFF: - Please ensure there is not already an duplicate clearance open for this procedure. - Under Visit Info/Reason for Call, type in Other and utilize the format Clearance MM/DD/YY or Clearance TBD. Do not use dashes or single digits. - If request is for dental extraction, please clarify the # of teeth to be extracted.  Request for surgical clearance: PATIENT CALLING   1. What type of surgery is being performed? Single extraction   2. When is this surgery scheduled? 06/18/20   3. What type of clearance is required (medical clearance vs. Pharmacy clearance to hold med vs. Both)? Medication   4. Are there any medications that need to be held prior to surgery and how long?  eliquis 5 mg po BID   5. Practice name and name of physician performing surgery?  [facility]   6. What is the office phone number? [phone]    7.   What is the office fax number? [phone]  8.   Anesthesia type (None, local, MAC, general) ? Local    Clarisse Gouge 06/16/2020, 3:59 PM  _________________________________________________________________   (provider comments below)

## 2020-06-16 NOTE — Telephone Encounter (Addendum)
   Patient Name: [name]  DOB: 11-09-1960  MRN: [id number]   Primary Cardiologist: Kate Sable, MD  Chart reviewed as part of pre-operative protocol coverage. Simple dental extractions are considered low risk procedures per guidelines and generally do not require any specific cardiac clearance. It is also generally accepted that for simple extractions and dental cleanings, there is no need to interrupt blood thinner therapy.  SBE prophylaxis is not required for the patient from a cardiac standpoint.  I will route this recommendation to the requesting party via Epic fax function and remove from pre-op pool.  Please call with questions.  Darreld Mclean, PA-C 06/16/2020, 4:36 PM

## 2020-06-23 ENCOUNTER — Encounter: Payer: Self-pay | Admitting: Pain Medicine

## 2020-06-23 NOTE — Progress Notes (Signed)
Patient: [name]  Service Category: E/M  Provider: Gaspar Cola, MD  DOB: Oct 22, 1960  DOS: 06/24/2020  Location: Office  MRN: [id number]  Setting: Ambulatory outpatient  Referring Provider: Virginia Crews, MD  Type: Established Patient  Specialty: Interventional Pain Management  PCP: Virginia Crews, MD  Location: Remote location  Delivery: TeleHealth     Virtual Encounter - Pain Management PROVIDER NOTE: Information contained herein reflects review and annotations entered in association with encounter. Interpretation of such information and data should be left to medically-trained personnel. Information provided to patient can be located elsewhere in the medical record under "Patient Instructions". Document created using STT-dictation technology, any transcriptional errors that may result from process are unintentional.    Contact & Pharmacy Preferred: [phone] Home: [phone] (home) Mobile: [phone] (mobile) E-mail: No e-mail address on record  [id number] [place]Phone: [phone]: [phone]  Pre-screening  Ms. HGellesoffered "in-person" vs "virtual" encounter. She indicated preferring virtual for this encounter.   Reason COVID-19*  Social distancing based on CDC and AMA recommendations.   I contacted RJones Skeneon 06/24/2020 via telephone.      I clearly identified myself as FGaspar Cola MD. I verified that I was speaking with the correct person using two identifiers (Name: RSHANAI LARTIGUE and date of birth: [birth year].  Consent I sought verbal advanced consent from RJones Skenefor virtual visit interactions. I informed Ms. HCidof possible security and privacy concerns, risks, and limitations associated with providing "not-in-person" medical evaluation and management services. I also informed Ms. HSchartzof the availability of "in-person" appointments.  Finally, I informed her that there would be a charge for the virtual visit and that she could be  personally, fully or partially, financially responsible for it. Ms. HBankoexpressed understanding and agreed to proceed.   Historic Elements   Ms. RRUHANI UMLANDis a 60y.o. year old, female patient evaluated today after our last contact on 05/19/2020. Ms. 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), Anemia, Anxiety, Asthma, Cephalalgia (08/18/2014), Cervical disc disease (08/18/2014), Chronic headaches, Chronic, continuous use of opioids, DDD (degenerative disc disease), Depression, Dissociative disorder, Dizziness (04/22/2013), Duodenal ulcer with hemorrhage and perforation (HKeene (04/27/2003), Foot drop, right, GERD (gastroesophageal reflux disease), H/O arthrodesis (08/18/2014), Headache(784.0), History of blood transfusion, History of cardioversion, History of cervical spinal surgery (01/04/2015), History of kidney stones, Hypertension, Inverted T wave, Narrowing of intervertebral disc space (08/18/2014), Orthostatic hypotension (04/22/2013), Pain, Pneumonia, PONV (postoperative nausea and vomiting), Postop Hyponatremia (05/14/2012), Postoperative anemia due to acute blood loss (05/14/2012), PTSD (post-traumatic stress disorder), Right foot drop, Right hip arthralgia (08/18/2014), Sleep apnea (2021), Sleep apnea, and Therapeutic opioid-induced constipation (OIC). She also  has a past surgical history that includes RIGHT HIP ARTHROSCOPY FOR LABRAL TEAR (ABOUT 2010); Anterior fusion cervical spine (MAY 2012); Nasal septum surgery (MARCH 2013); Back surgery (2009); Tonsillectomy; Abdominal hysterectomy; DIAGNOSTIC LAPAROSCOPIES - MULTIPLE FOR ENDOMETRIOSIS; Cholecystectomy; Hip arthroscopy (09/20/2011); Shoulder arthroscopy (05-06-12); Carpal tunnel release (05-06-12); Total hip arthroplasty (Right,  05/13/2012); Anterior cervical decomp/discectomy fusion (N/A, 10/30/2012); Posterior cervical fusion/foraminotomy (N/A, 04/16/2013); Breast reduction surgery (Bilateral, 06/2016); Colonoscopy with propofol (N/A, 01/26/2017); Esophagogastroduodenoscopy (egd) with propofol (N/A, 01/26/2017); Esophageal dilation (N/A, 01/26/2017); polypectomy (N/A, 01/26/2017); PH impedance study (N/A, 05/30/2017); Esophageal manometry (N/A, 05/30/2017); Radiofrequency ablation nerves; afib (05/2019);  LEFT HEART CATH AND CORONARY ANGIOGRAPHY (Left, 06/10/2019); Cardioversion (N/A, 12/03/2019); Esophagogastroduodenoscopy (egd) with propofol (N/A, 01/30/2020); Reduction mammaplasty (Bilateral, 05/2016); Breast biopsy (Right, 2008); and Breast excisional biopsy (Left, 1998). Ms. Gravett has a current medication list which includes the following prescription(s): acetaminophen, alprazolam, amiodarone, apixaban, buspirone, linaclotide, menthol (topical analgesic), montelukast, morphine, ondansetron, pantoprazole, polyethylene glycol, valacyclovir, and [START ON 07/20/2020] morphine. She  reports that she has never smoked. She has never used smokeless tobacco. She reports current drug use. She reports that she does not drink alcohol. Ms. Moening is allergic to amoxicillin, chlorhexidine gluconate, clindamycin, codeine, erythromycin, penicillin g, sulfa antibiotics, levofloxacin, shellfish allergy, decadron [dexamethasone], mangifera indica, papaya derivatives, betadine [povidone iodine], clarithromycin, other, povidone-iodine, and prednisone.   HPI  Today, she is being contacted for a post-procedure assessment.  The patient appears to have attained an ongoing 80% relief of the left shoulder pain over the distribution Corvite of the suprascapular nerve.  She still has some pain between the shoulder blades suggesting either a C5 radiculitis or some facet disease affecting that area.  Because she is not complaining of upper extremity symptoms over the C5  distribution, it is likely that the pain in that area may be facet joint mediated.  Today I have talked to the patient about several options including doing a diagnostic medial branch block over the affected area to determine if that helps with the pain.  If it does, I have informed the patient that radiofrequency ablation of the medial branches is an option.  She indicated being interested in this, but right now she refers not knowing exactly what she has planned for next month and therefore she wants to hold.  She indicated that she will give [place] a call to schedule the cervical thoracic facet blocks, at a later time.  Today I went over the results of her recent cervical MRI, which I have explained to the patient.  RTCB: 08/19/2020  Post-Procedure Evaluation  Procedure (05/13/2020): Therapeutic left-sided suprascapular nerve RFA # 2 (without steroids) under fluoroscopic guidance and IV sedation Pre-procedure pain level: 5/10 Post-procedure: 0/10 (100% relief)  Sedation: Sedation provided.  Effectiveness during initial hour after procedure(Ultra-Short Term Relief): 100 %.  Local anesthetic used: Long-acting (4-6 hours) Effectiveness: Defined as any analgesic benefit obtained secondary to the administration of local anesthetics. This carries significant diagnostic value as to the etiological location, or anatomical origin, of the pain. Duration of benefit is expected to coincide with the duration of the local anesthetic used.  Effectiveness during initial 4-6 hours after procedure(Short-Term Relief): 100 %.  Long-term benefit: Defined as any relief past the pharmacologic duration of the local anesthetics.  Effectiveness past the initial 6 hours after procedure(Long-Term Relief): 20 % (Left shoulder is better, but between shoulder blades is not.).  The pain that she is describing is between the shoulder blades, and the area innervated by the C5.  She has a history of extensive cervical surgery with an  anterior cervical discectomy and posterior fusion with a corpectomy at C5.  She still has a mild anterolisthesis of C4 over C5 and some mild right foraminal narrowing due to spurs.  The MRI also indicates that she has some facet degeneration at the C7-T1 area and it is likely that some of that has to do with the pain that she is currently experiencing.  If we look at the area covered by the suprascapular nerve which is primarily the shoulder area and the anterior portion of the shoulder, she refers that she  is doing great and she has attained 80% relief of that pain.  Current benefits: Defined as benefit that persist at this time.   Analgesia:  80% ongoing relief of the shoulder pain. Function: Ms. Mongiello reports improvement in function ROM: Ms. Ringstad reports improvement in ROM  Pharmacotherapy Assessment  Analgesic: Morphine IR 15 mg 1 tab p.o. every 6 hours (60 mg/day of morphine) MME/day: 60 mg/day.   Monitoring: [place] PMP: PDMP reviewed during this encounter.       Pharmacotherapy: No side-effects or adverse reactions reported. Compliance: No problems identified. Effectiveness: Clinically acceptable. Plan: Refer to "POC".  UDS:  Summary  Date Value Ref Range Status  08/20/2019 Note  Final    Comment:    ==================================================================== [employer] 13 (MW) ==================================================================== Test                             Result       Flag       Units  Drug Present and Declared for Prescription Verification   Alprazolam                     132          EXPECTED   ng/mg creat   Alpha-hydroxyalprazolam        293          EXPECTED   ng/mg creat    Source of alprazolam is a scheduled prescription medication. Alpha-    hydroxyalprazolam is an expected metabolite of alprazolam.    Oxycodone                      2443         EXPECTED   ng/mg creat   Oxymorphone                    922          EXPECTED   ng/mg  creat   Noroxycodone                   >9434        EXPECTED   ng/mg creat   Noroxymorphone                 225          EXPECTED   ng/mg creat    Sources of oxycodone are scheduled prescription medications.    Oxymorphone, noroxycodone, and noroxymorphone are expected    metabolites of oxycodone. Oxymorphone is also available as a    scheduled prescription medication.  ==================================================================== Test                      Result    Flag   Units      Ref Range   Creatinine              106              mg/dL      >=20 ==================================================================== Declared Medications:  The flagging and interpretation on this report are based on the  following declared medications.  Unexpected results may arise from  inaccuracies in the declared medications.   **Note: The testing scope of this panel includes these medications:   Alprazolam (Xanax)  Oxycodone   **Note: The testing scope of this panel does not include the  following reported medications:   Acetaminophen (Tylenol)  Albuterol (Ventolin  HFA)  Apixaban (Eliquis)  Calcium (Tums)  Cyclobenzaprine (Flexeril)  Fluticasone (Advair)  Furosemide (Lasix)  Lubiprostone (Amitiza)  Metoprolol (Toprol)  Omeprazole (Prilosec)  Ondansetron (Zofran)  Pantoprazole (Protonix)  Salmeterol (Advair)  Valacyclovir (Valtrex) ==================================================================== For clinical consultation, please call [phone]. ====================================================================     Laboratory Chemistry Profile   Renal Lab Results  Component Value Date   BUN 12 05/01/2020   CREATININE 1.01 (H) 05/01/2020   BCR 7 (L) 11/11/2018   GFRAA >60 11/26/2019   GFRNONAA >60 05/01/2020     Hepatic Lab Results  Component Value Date   AST 20 05/01/2020   ALT 13 05/01/2020   ALBUMIN 4.5 05/01/2020   ALKPHOS 98 05/01/2020   LIPASE  28 05/01/2020     Electrolytes Lab Results  Component Value Date   NA 133 (L) 05/01/2020   K 3.5 05/01/2020   CL 100 05/01/2020   CALCIUM 9.5 05/01/2020   MG 1.9 11/11/2018     Bone Lab Results  Component Value Date   25OHVITD1 23 (L) 11/11/2018   25OHVITD2 9.8 11/11/2018   25OHVITD3 13 11/11/2018     Inflammation (CRP: Acute Phase) (ESR: Chronic Phase) Lab Results  Component Value Date   CRP 12 (H) 11/11/2018   ESRSEDRATE 42 (H) 11/11/2018   LATICACIDVEN 1.2 05/01/2020       Note: Above Lab results reviewed.  Imaging  LONG TERM MONITOR (3-14 DAYS) Patch Wear Time:  13 days and 23 hours (2022-02-11T16:00:10-498 to  2022-02-25T15:19:06-498)  Patient had a min HR of 35 bpm, max HR of 152 bpm, and avg HR of 59 bpm.  Predominant underlying rhythm was Sinus Rhythm.  Frequent paroxysmal SVTs  noted, episodes of atrial fibrillation noted.  Profound sinus bradycardia  heart rates in the 30s noted.  Assessment  The primary encounter diagnosis was Chronic shoulder pain (Left). Diagnoses of Chronic low back pain (1ry area of Pain) (Bilateral) (L>R) w/o sciatica, Chronic hip pain (2ry area of Pain) (Bilateral) (L>R), Chronic shoulder pain (3ry area of Pain) (Bilateral) (L>R), Chronic pain syndrome, Abnormal MRI, cervical spine (05/12/2020), and Chronic upper back pain were also pertinent to this visit.  Plan of Care  Problem-specific:  No problem-specific Assessment & Plan notes found for this encounter.  Ms. ANIVEA VELASQUES has a current medication list which includes the following long-term medication(s): amiodarone, apixaban, linaclotide, montelukast, morphine, [START ON 07/20/2020] morphine, and pantoprazole.  Pharmacotherapy (Medications Ordered): Meds ordered this encounter  Medications  . morphine (MSIR) 15 MG tablet    Sig: Take 1 tablet (15 mg total) by mouth every 6 (six) hours as needed for moderate pain or severe pain. Must last 30 days.    Dispense:  120  tablet    Refill:  0    Not a duplicate. Do NOT delete! Dispense 1 day early if closed on refill date. Avoid benzodiazepines within 8 hours of opioids. Do not send refill requests.   Orders:  No orders of the defined types were placed in this encounter.  Follow-up plan:   Return in about 8 weeks (around 08/19/2020) for (F2F), (MM).      Interventional Therapies  Risk  Complexity Considerations:   1. SHELLFISH ALLERGY; Dexamethasone & Prednisone allergy.  (NO STEROIDS)  2. NOT a candidate for lumbar facet RFA (due to hardware). 3. NOT a candidate for membrane stabilizers (Neurontin/Lyrica) due to prior failed trials. 4. NOT a candidate for SCS (due to HARDWARE). 5. NOT a candidate for IT Pump (patient's choice due to  surgical risks).     Planned  Pending:   Pending further evaluation   Under consideration:   Diagnostic cervico-thoracic facet blocks    Completed:   Diagnostic bilateral lumbar facet block x2 (01/14/2019) Palliativebilateral suprascapular NB x2(No Steroids) (06/19/2016) Palliative leftsuprascapular nerve RFA x1(01/04/2017)   Therapeutic  Palliative (PRN) options:   Diagnostic bilateral lumbar facet block #2  Palliativebilateral suprascapular NB #3(No Steroids) Palliative leftsuprascapular nerve RFA #2      Recent Visits Date Type Provider Dept  05/13/20 Procedure visit Milinda Pointer, MD [facility]  04/21/20 Office Visit Milinda Pointer, MD [facility]  Showing recent visits within past 90 days and meeting all other requirements Today's Visits Date Type Provider Dept  06/24/20 Telemedicine Milinda Pointer, MD [facility]  Showing today's visits and meeting all other requirements Future Appointments Date Type Provider Dept  07/19/20 Appointment Milinda Pointer, MD [facility]  Showing future appointments within next 90 days and meeting all other requirements  I discussed the assessment and  treatment plan with the patient. The patient was provided an opportunity to ask questions and all were answered. The patient agreed with the plan and demonstrated an understanding of the instructions.  Patient advised to call back or seek an in-person evaluation if the symptoms or condition worsens.  Duration of encounter: 14 minutes.  Note by: Gaspar Cola, MD Date: 06/24/2020; Time: 2:15 PM

## 2020-06-24 ENCOUNTER — Other Ambulatory Visit: Payer: Self-pay

## 2020-06-24 ENCOUNTER — Ambulatory Visit: Payer: Medicare Other | Attending: Pain Medicine | Admitting: Pain Medicine

## 2020-06-24 DIAGNOSIS — M25512 Pain in left shoulder: Secondary | ICD-10-CM | POA: Diagnosis not present

## 2020-06-24 DIAGNOSIS — M25511 Pain in right shoulder: Secondary | ICD-10-CM | POA: Diagnosis not present

## 2020-06-24 DIAGNOSIS — G4733 Obstructive sleep apnea (adult) (pediatric): Secondary | ICD-10-CM | POA: Diagnosis not present

## 2020-06-24 DIAGNOSIS — M549 Dorsalgia, unspecified: Secondary | ICD-10-CM

## 2020-06-24 DIAGNOSIS — M25552 Pain in left hip: Secondary | ICD-10-CM | POA: Diagnosis not present

## 2020-06-24 DIAGNOSIS — G894 Chronic pain syndrome: Secondary | ICD-10-CM

## 2020-06-24 DIAGNOSIS — R937 Abnormal findings on diagnostic imaging of other parts of musculoskeletal system: Secondary | ICD-10-CM

## 2020-06-24 DIAGNOSIS — G8929 Other chronic pain: Secondary | ICD-10-CM

## 2020-06-24 DIAGNOSIS — M25551 Pain in right hip: Secondary | ICD-10-CM

## 2020-06-24 DIAGNOSIS — M545 Low back pain, unspecified: Secondary | ICD-10-CM | POA: Diagnosis not present

## 2020-06-24 MED ORDER — MORPHINE SULFATE 15 MG PO TABS: 15.0000 mg | ORAL_TABLET | Freq: Four times a day (QID) | ORAL | 0 refills | Status: DC | PRN

## 2020-06-30 ENCOUNTER — Ambulatory Visit: Payer: Medicare Other | Admitting: Cardiology

## 2020-06-30 ENCOUNTER — Encounter: Payer: Self-pay | Admitting: Cardiology

## 2020-06-30 ENCOUNTER — Other Ambulatory Visit: Payer: Self-pay

## 2020-06-30 VITALS — BP 120/74 | HR 53 | Ht 64.0 in | Wt 142.0 lb

## 2020-06-30 DIAGNOSIS — I4811 Longstanding persistent atrial fibrillation: Secondary | ICD-10-CM | POA: Insufficient documentation

## 2020-06-30 DIAGNOSIS — I4819 Other persistent atrial fibrillation: Secondary | ICD-10-CM | POA: Diagnosis not present

## 2020-06-30 DIAGNOSIS — I10 Essential (primary) hypertension: Secondary | ICD-10-CM

## 2020-06-30 DIAGNOSIS — I483 Typical atrial flutter: Secondary | ICD-10-CM

## 2020-06-30 MED ORDER — METOPROLOL TARTRATE 100 MG PO TABS: ORAL_TABLET | ORAL | 0 refills | Status: DC

## 2020-06-30 NOTE — Patient Instructions (Addendum)
Medication Instructions:  Your physician recommends that you continue on your current medications as directed. Please refer to the Current Medication list given to you today. *If you need a refill on your cardiac medications before your next appointment, please call your pharmacy*  Lab Work: You will get lab work the same day as your ECHO at [facility].  If you have labs (blood work) drawn today and your tests are completely normal, you will receive your results only by: Marland Kitchen MyChart Message (if you have MyChart) OR . A paper copy in the mail If you have any lab test that is abnormal or we need to change your treatment, we will call you to review the results.  Testing/Procedures: Your physician has requested that you have an echocardiogram. Echocardiography is a painless test that uses sound waves to create images of your heart. It provides your doctor with information about the size and shape of your heart and how well your heart's chambers and valves are working. This procedure takes approximately one hour. There are no restrictions for this procedure.  Please schedule for ECHO  Follow-Up: At [facility], you and your health needs are our priority.  As part of our continuing mission to provide you with exceptional heart care, we have created designated Provider Care Teams.  These Care Teams include your primary Cardiologist (physician) and Advanced Practice Providers (APPs -  Physician Assistants and Nurse Practitioners) who all work together to provide you with the care you need, when you need it.  Your next appointment:    You will get a phone call from [facility] to schedule your follow up visits.  SEE INSTRUCTION LETTER   Cardiac electrophysiology: From cell to bedside (7th ed., pp. 2542-7062). [place], [place]: Elsevier.">  Cardiac Ablation Cardiac ablation is a procedure to destroy, or ablate, a small amount of heart tissue in very specific places. The heart has many electrical  connections. Sometimes these connections are abnormal and can cause the heart to beat very fast or irregularly. Ablating some of the areas that cause problems can improve the heart's rhythm or return it to normal. Ablation may be done for people who:  Have Wolff-Parkinson-White syndrome.  Have fast heart rhythms (tachycardia).  Have taken medicines for an abnormal heart rhythm (arrhythmia) that were not effective or caused side effects.  Have a high-risk heartbeat that may be life-threatening. During the procedure, a small incision is made in the neck or the groin, and a long, thin tube (catheter) is inserted into the incision and moved to the heart. Small devices (electrodes) on the tip of the catheter will send out electrical currents. A type of X-ray (fluoroscopy) will be used to help guide the catheter and to provide images of the heart. Tell a health care provider about:  Any allergies you have.  All medicines you are taking, including vitamins, herbs, eye drops, creams, and over-the-counter medicines.  Any problems you or family members have had with anesthetic medicines.  Any blood disorders you have.  Any surgeries you have had.  Any medical conditions you have, such as kidney failure.  Whether you are pregnant or may be pregnant. What are the risks? Generally, this is a safe procedure. However, problems may occur, including:  Infection.  Bruising and bleeding at the catheter insertion site.  Bleeding into the chest, especially into the sac that surrounds the heart. This is a serious complication.  Stroke or blood clots.  Damage to nearby structures or organs.  Allergic reaction  to medicines or dyes.  Need for a permanent pacemaker if the normal electrical system is damaged. A pacemaker is a small computer that sends electrical signals to the heart and helps your heart beat normally.  The procedure not being fully effective. This may not be recognized until months  later. Repeat ablation procedures are sometimes done. What happens before the procedure? Medicines Ask your health care provider about:  Changing or stopping your regular medicines. This is especially important if you are taking diabetes medicines or blood thinners.  Taking medicines such as aspirin and ibuprofen. These medicines can thin your blood. Do not take these medicines unless your health care provider tells you to take them.  Taking over-the-counter medicines, vitamins, herbs, and supplements. General instructions  Follow instructions from your health care provider about eating or drinking restrictions.  Plan to have someone take you home from the hospital or clinic.  If you will be going home right after the procedure, plan to have someone with you for 24 hours.  Ask your health care provider what steps will be taken to prevent infection. What happens during the procedure?  An IV will be inserted into one of your veins.  You will be given a medicine to help you relax (sedative).  The skin on your neck or groin will be numbed.  An incision will be made in your neck or your groin.  A needle will be inserted through the incision and into a large vein in your neck or groin.  A catheter will be inserted into the needle and moved to your heart.  Dye may be injected through the catheter to help your surgeon see the area of the heart that needs treatment.  Electrical currents will be sent from the catheter to ablate heart tissue in desired areas. There are three types of energy that may be used to do this: ? Heat (radiofrequency energy). ? Laser energy. ? Extreme cold (cryoablation).  When the tissue has been ablated, the catheter will be removed.  Pressure will be held on the insertion area to prevent a lot of bleeding.  A bandage (dressing) will be placed over the insertion area. The exact procedure may vary among health care providers and hospitals.   What happens  after the procedure?  Your blood pressure, heart rate, breathing rate, and blood oxygen level will be monitored until you leave the hospital or clinic.  Your insertion area will be monitored for bleeding. You will need to lie still for a few hours to ensure that you do not bleed from the insertion area.  Do not drive for 24 hours or as long as told by your health care provider. Summary  Cardiac ablation is a procedure to destroy, or ablate, a small amount of heart tissue using an electrical current. This procedure can improve the heart rhythm or return it to normal.  Tell your health care provider about any medical conditions you may have and all medicines you are taking to treat them.  This is a safe procedure, but problems may occur. Problems may include infection, bruising, damage to nearby organs or structures, or allergic reactions to medicines.  Follow your health care provider's instructions about eating and drinking before the procedure. You may also be told to change or stop some of your medicines.  After the procedure, do not drive for 24 hours or as long as told by your health care provider. This information is not intended to replace advice given to you by  your health care provider. Make sure you discuss any questions you have with your health care provider. Document Revised: 01/06/2019 Document Reviewed: 01/06/2019 Elsevier Patient Education  [place].

## 2020-06-30 NOTE — Progress Notes (Signed)
Electrophysiology Office Follow up Visit Note:    Date:  06/30/2020   ID:  [name], DOB 1960/05/26, MRN [id number]  PCP:  Virginia Crews, MD  [facility] Cardiologist:  Kate Sable, MD     Interval History:    [name] is a 60 y.o. female who presents for a follow up visit.  I last saw the patient May 26, 2020 as an acute visit for lightheadedness.  This was thought to be related to her anxiety and depression rather than AV conduction disease.  She was then seen by Dr. Caryl Comes on June 01, 2020 who agreed with that assessment but wanted to have her considered for atrial fibrillation/flutter ablation in an effort to avoid long-term use of amiodarone.  She presents to clinic to discuss this.  She tells me she has been doing okay since I last saw her.  She continues to take amiodarone 200 mg by mouth once daily and Eliquis 5 mg by mouth twice daily for stroke prophylaxis.  No bleeding troubles.  She is very interested/motivated to get off amiodarone.     Past Medical History:  Diagnosis Date  . A-fib ([place]) 2021  . Abnormal EKG    HX OF INVERTED T WAVES ON EKG, PALPITATIONS, CHEST PAINS-CARDIAC WORK UP DID NOT SHOW ANY HEART DISEASE  . AC (acromioclavicular) joint bone spurs   . Acute postoperative pain 01/04/2017  . 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 Anemia    Iron Infusion-8 yrs ago  . Anxiety   . Asthma   . Cephalalgia 08/18/2014  . Cervical disc disease 08/18/2014   Needs neck surgery.   . Chronic headaches   . Chronic, continuous use of opioids   . DDD (degenerative disc disease)    CERVICAL AND LUMBAR-CHRONIC PAIN, RT HIP LABRAL TEAR  . Depression    PT STATES A LOT OF STRESS IN HER LIFE  . Dissociative disorder   . Dizziness 04/22/2013  . Duodenal ulcer with hemorrhage and perforation ([place]) 04/27/2003  . Foot drop, right    FROM BACK SURGERY  . GERD (gastroesophageal reflux disease)   . H/O arthrodesis 08/18/2014  . Headache(784.0)    AND NECK  PAIN--STATES RECENT TEST SHOW CERVICAL DEGENERATION  . History of blood transfusion    s/p back surgery  . History of cardioversion   . History of cervical spinal surgery 01/04/2015  . History of kidney stones   . Hypertension   . Inverted T wave   . Narrowing of intervertebral disc space 08/18/2014   Currently on disability.   . Orthostatic hypotension 04/22/2013  . Pain    CHRONIC NECK AND BACK PAIN - LIMITED ROM NECK - S/P FUSIONS CERVICAL AND LUMBAR  . Pneumonia   . PONV (postoperative nausea and vomiting)    PT GIVES HX OF N&V AND FEVER WITH SURGERIES YEARS AGO--BUT NO PROBLEMS WITH MORE RECENT SURGERIES--STATES NOT MALIGNANT HYPERTHERMIA  . Postop Hyponatremia 05/14/2012  . Postoperative anemia due to acute blood loss 05/14/2012  . PTSD (post-traumatic stress disorder)   . Right foot drop   . 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   . Sleep apnea 2021   does not have a cpap  . Sleep apnea   . Therapeutic opioid-induced constipation (OIC)     Past Surgical History:  Procedure Laterality Date  . ABDOMINAL HYSTERECTOMY    . afib  05/2019  . ANTERIOR CERVICAL DECOMP/DISCECTOMY FUSION N/A 10/30/2012  Procedure: ACDF C5-6, EXPLORATION AND HARDWARE REMOVAL C6-7;  Surgeon: Melina Schools, MD;  Location: [place];  Service: Orthopedics;  Laterality: N/A;  . ANTERIOR FUSION CERVICAL SPINE  MAY 2012   AT [facility]  . BACK SURGERY  2009   LUMBAR FUSION WITH RODS   . BREAST BIOPSY Right 2008   benign.- bx/clip  . BREAST EXCISIONAL BIOPSY Left 1998   benign  . BREAST REDUCTION SURGERY Bilateral 06/2016  . CARDIOVERSION N/A 12/03/2019   Procedure: CARDIOVERSION;  Surgeon: Kate Sable, MD;  Location: [facility];  Service: Cardiovascular;  Laterality: N/A;  . CARPAL TUNNEL RELEASE  05-06-12   Right  . CHOLECYSTECTOMY    . COLONOSCOPY WITH PROPOFOL N/A 01/26/2017   Procedure: COLONOSCOPY WITH PROPOFOL;  Surgeon: Lucilla Lame, MD;  Location: [place];  Service: Endoscopy;  Laterality: N/A;  . DIAGNOSTIC LAPAROSCOPIES - MULTIPLE FOR ENDOMETRIOSIS    . ESOPHAGEAL DILATION N/A 01/26/2017   Procedure: ESOPHAGEAL DILATION;  Surgeon: Lucilla Lame, MD;  Location: [place];  Service: Endoscopy;  Laterality: N/A;  . ESOPHAGEAL MANOMETRY N/A 05/30/2017   Procedure: ESOPHAGEAL MANOMETRY (EM);  Surgeon: Lucilla Lame, MD;  Location: [facility];  Service: Endoscopy;  Laterality: N/A;  . ESOPHAGOGASTRODUODENOSCOPY (EGD) WITH PROPOFOL N/A 01/26/2017   Procedure: ESOPHAGOGASTRODUODENOSCOPY (EGD) WITH PROPOFOL;  Surgeon: Lucilla Lame, MD;  Location: [place];  Service: Endoscopy;  Laterality: N/A;  . ESOPHAGOGASTRODUODENOSCOPY (EGD) WITH PROPOFOL N/A 01/30/2020   Procedure: ESOPHAGOGASTRODUODENOSCOPY (EGD) WITH PROPOFOL;  Surgeon: Lucilla Lame, MD;  Location: [place];  Service: Endoscopy;  Laterality: N/A;  sleep apnea COVID + 01-15-20  . HIP ARTHROSCOPY  09/20/2011   Procedure: ARTHROSCOPY HIP;  Surgeon: Gearlean Alf, MD;  Location: [facility];  Service: Orthopedics;  Laterality: Right;  Right Hip Scope with Labral Debridement  . LEFT HEART CATH AND CORONARY ANGIOGRAPHY Left 06/10/2019   Procedure: LEFT HEART CATH AND CORONARY ANGIOGRAPHY;  Surgeon: Nelva Bush, MD;  Location: [place] [facility];  Service: Cardiovascular;  Laterality: Left;  . NASAL SEPTUM SURGERY  MARCH 2013   IN [place]  . [place] IMPEDANCE STUDY N/A 05/30/2017   Procedure: [place] IMPEDANCE STUDY;  Surgeon: Lucilla Lame, MD;  Location: [facility];  Service: Endoscopy;  Laterality: N/A;  . POLYPECTOMY N/A 01/26/2017   Procedure: POLYPECTOMY;  Surgeon: Lucilla Lame, MD;  Location: [place];  Service: Endoscopy;  Laterality: N/A;  . POSTERIOR CERVICAL FUSION/FORAMINOTOMY N/A 04/16/2013   Procedure: REMOVAL CERVICAL PLATES AND INTERBODY CAGE/POSTERIOR CERVICAL SPINAL FUSION C4 - C6/C5 CORPECTOMY/C4 - C6 FUSION WITH ILIAC CREST BONE  GRAFT;  Surgeon: Melina Schools, MD;  Location: [place];  Service: Orthopedics;  Laterality: N/A;  . RADIOFREQUENCY ABLATION NERVES    . REDUCTION MAMMAPLASTY Bilateral 05/2016  . RIGHT HIP ARTHROSCOPY FOR LABRAL TEAR  ABOUT 2010   2012 also  . SHOULDER ARTHROSCOPY  05-06-12   bone spur  . TONSILLECTOMY     AS A CHILD  . TOTAL HIP ARTHROPLASTY Right 05/13/2012   Procedure: TOTAL HIP ARTHROPLASTY ANTERIOR APPROACH;  Surgeon: Gearlean Alf, MD;  [facility];  Service: Orthopedics;  Laterality: Right;    Current Medications: Current Meds  Medication Sig  . acetaminophen (TYLENOL) 500 MG tablet Take 500-1,000 mg by mouth every 6 (six) hours as needed for moderate pain.   Marland Kitchen ALPRAZolam (XANAX) 1 MG tablet Take 1 mg by mouth at bedtime.   Marland Kitchen amiodarone (PACERONE) 200 MG tablet Take 1 tablet (200 mg total) by mouth  daily.  . apixaban (ELIQUIS) 5 MG TABS tablet Take 1 tablet (5 mg total) by mouth 2 (two) times daily.  . busPIRone (BUSPAR) 7.5 MG tablet Take 7.5 mg by mouth 2 (two) times daily.  Marland Kitchen linaclotide (LINZESS) 290 MCG CAPS capsule Take 1 capsule (290 mcg total) by mouth daily before breakfast.  . Menthol, Topical Analgesic, (BENGAY EX) Apply 1 application topically daily as needed (pain).  . montelukast (SINGULAIR) 10 MG tablet TAKE 1 TABLET BY MOUTH EVERY DAY  . morphine (MSIR) 15 MG tablet Take 1 tablet (15 mg total) by mouth every 6 (six) hours as needed for moderate pain or severe pain. Must last 30 days.  Derrill Memo ON 07/20/2020] morphine (MSIR) 15 MG tablet Take 1 tablet (15 mg total) by mouth every 6 (six) hours as needed for moderate pain or severe pain. Must last 30 days.  . ondansetron (ZOFRAN ODT) 4 MG disintegrating tablet Take 1 tablet (4 mg total) by mouth every 8 (eight) hours as needed for nausea or vomiting.  . pantoprazole (PROTONIX) 40 MG tablet TAKE 1 TABLET BY MOUTH TWICE A DAY  . polyethylene glycol (MIRALAX / GLYCOLAX) 17 g packet Take 17 g by mouth daily as needed.   . valACYclovir (VALTREX) 1000 MG tablet TAKE TWO TABLETS BY MOUTH TWICE DAILY FOR ONE DAY FEVER BLISTER     Allergies:   Amoxicillin, Chlorhexidine gluconate, Clindamycin, Codeine, Erythromycin, Penicillin g, Sulfa antibiotics, Levofloxacin, Shellfish allergy, Decadron [dexamethasone], Mangifera indica, Papaya derivatives, Betadine [povidone iodine], Clarithromycin, Other, Povidone-iodine, and Prednisone   Social History   Socioeconomic History  . Marital status: Married    Spouse name: bruce  . Number of children: 2  . Years of education: Not on file  . Highest education level: Associate degree: occupational, Hotel manager, or vocational program  Occupational History    Comment: disabled  Tobacco Use  . Smoking status: Never Smoker  . Smokeless tobacco: Never Used  Vaping Use  . Vaping Use: Never used  Substance and Sexual Activity  . Alcohol use: No    Alcohol/week: 0.0 standard drinks  . Drug use: Yes    Comment: prescribed morphine and xanax  . Sexual activity: Yes  Other Topics Concern  . Not on file  Social History Narrative   Son, daughter  And 4 grandkids; raises 3 of the grandchildren   Social Determinants of Health   Financial Resource Strain: High Risk  . Difficulty of Paying Living Expenses: Very hard  Food Insecurity: Food Insecurity Present  . Worried About Charity fundraiser in the Last Year: Sometimes true  . Ran Out of Food in the Last Year: Often true  Transportation Needs: Unmet Transportation Needs  . Lack of Transportation (Medical): Yes  . Lack of Transportation (Non-Medical): Yes  Physical Activity: Inactive  . Days of Exercise per Week: 0 days  . Minutes of Exercise per Session: 0 min  Stress: Stress Concern Present  . Feeling of Stress : Very much  Social Connections: Moderately Isolated  . Frequency of Communication with Friends and Family: More than three times a week  . Frequency of Social Gatherings with Friends and Family: Never  . Attends  Religious Services: Never  . Active Member of Clubs or Organizations: No  . Attends Archivist Meetings: Never  . Marital Status: Married     Family History: The patient's family history includes Aneurysm in her maternal grandmother and mother; Anxiety disorder in her grandchild; Bipolar disorder in her grandchild and  sister; Breast cancer (age of onset: 48) in her paternal grandmother; Depression in her grandchild.  ROS:   Please see the history of present illness.    All other systems reviewed and are negative.  EKGs/Labs/Other Studies Reviewed:    The following studies were reviewed today:   EKG:  The ekg ordered today demonstrates sinus rhythm with a ventricular rate of 53 bpm  Recent Labs: 07/10/2019: B Natriuretic Peptide 395.0; TSH 1.424 05/01/2020: ALT 13; BUN 12; Creatinine, Ser 1.01; Hemoglobin 12.4; Platelets 409; Potassium 3.5; Sodium 133  Recent Lipid Panel    Component Value Date/Time   CHOL 183 01/13/2020 1117   TRIG 116 01/13/2020 1117   HDL 64 01/13/2020 1117   CHOLHDL 2.9 01/13/2020 1117   LDLCALC 99 01/13/2020 1117    Physical Exam:    VS:  BP 120/74   Pulse (!) 53   Ht 5\' 4"  (1.626 m)   Wt 142 lb (64.4 kg)   SpO2 99%   BMI 24.37 kg/m     Wt Readings from Last 3 Encounters:  06/30/20 142 lb (64.4 kg)  06/01/20 142 lb 4 oz (64.5 kg)  05/26/20 142 lb 12.8 oz (64.8 kg)     GEN:  Well nourished, well developed in no acute distress HEENT: Normal NECK: No JVD; No carotid bruits LYMPHATICS: No lymphadenopathy CARDIAC: RRR, no murmurs, rubs, gallops RESPIRATORY:  Clear to auscultation without rales, wheezing or rhonchi  ABDOMEN: Soft, non-tender, non-distended MUSCULOSKELETAL:  No edema; No deformity  SKIN: Warm and dry NEUROLOGIC:  Alert and oriented x 3 PSYCHIATRIC:  Normal affect   ASSESSMENT:    1. Typical atrial flutter (HCC)   2. Persistent atrial fibrillation ([place])   3. Essential hypertension    PLAN:    In order of  problems listed above:  1. Persistent atrial fibrillation and typical atrial flutter Maintaining sinus rhythm on amiodarone.  The patient would like to pursue a strategy for rhythm control but avoid long-term exposure to amiodarone.  I discussed ablation in detail during today's visit including the risk, expected efficacy and recovery time.  She would like to proceed with scheduling.  We will need to repeat her echocardiogram and obtain a CT PE protocol to evaluate her left atrial anatomy.  She understands this.  She will continue to take her Eliquis uninterrupted.  Risk, benefits, and alternatives to EP study and radiofrequency ablation for afib were also discussed in detail today. These risks include but are not limited to stroke, bleeding, vascular damage, tamponade, perforation, damage to the esophagus, lungs, and other structures, pulmonary vein stenosis, worsening renal function, and death. The patient understands these risk and wishes to proceed.  We will therefore proceed with catheter ablation at the next available time.  Carto, ICE, anesthesia are requested for the procedure.  Will also obtain CT PV protocol prior to the procedure to exclude LAA thrombus and further evaluate atrial anatomy.  Ablation strategy will be PVI plus posterior wall plus CTI.  We will plan to continue the amiodarone for at least 3 months following ablation.  If she is doing well for 75-month mark after ablation we will discontinue the amiodarone.  2.  Hypertension Controlled.  Continue current regimen.  Total time spent with patient today 40 minutes. This includes reviewing records, evaluating the patient and coordinating care.   Medication Adjustments/Labs and Tests Ordered: Current medicines are reviewed at length with the patient today.  Concerns regarding medicines are outlined above.  Orders Placed This Encounter  Procedures  . CT CARDIAC MORPH/PULM VEIN W/CM&W/O CA SCORE  . Basic Metabolic Panel (BMET)  .  CBC w/Diff  . EKG 12-Lead  . ECHOCARDIOGRAM COMPLETE   No orders of the defined types were placed in this encounter.    Signed, Lars Mage, MD, [facility], [facility] 06/30/2020 10:44 AM    Electrophysiology [facility]

## 2020-07-09 DIAGNOSIS — G4733 Obstructive sleep apnea (adult) (pediatric): Secondary | ICD-10-CM | POA: Diagnosis not present

## 2020-07-19 ENCOUNTER — Encounter: Payer: Medicare Other | Admitting: Pain Medicine

## 2020-07-19 ENCOUNTER — Other Ambulatory Visit: Payer: Self-pay | Admitting: Gastroenterology

## 2020-07-22 ENCOUNTER — Ambulatory Visit (INDEPENDENT_AMBULATORY_CARE_PROVIDER_SITE_OTHER): Payer: Medicare Other

## 2020-07-22 ENCOUNTER — Other Ambulatory Visit
Admission: RE | Admit: 2020-07-22 | Discharge: 2020-07-22 | Disposition: A | Discharge status: Home / Self Care | Payer: Medicare Other | Attending: Cardiology | Admitting: Cardiology

## 2020-07-22 ENCOUNTER — Other Ambulatory Visit: Payer: Self-pay

## 2020-07-22 DIAGNOSIS — I4819 Other persistent atrial fibrillation: Secondary | ICD-10-CM | POA: Diagnosis not present

## 2020-07-22 LAB — CBC WITH DIFFERENTIAL/PLATELET
Abs Immature Granulocytes: 0.02 10*3/uL (ref 0.00–0.07)
Basophils Absolute: 0.1 10*3/uL (ref 0.0–0.1)
Basophils Relative: 2 %
Eosinophils Absolute: 0.1 10*3/uL (ref 0.0–0.5)
Eosinophils Relative: 1 %
HCT: 33.9 % — ABNORMAL LOW (ref 36.0–46.0)
Hemoglobin: 11 g/dL — ABNORMAL LOW (ref 12.0–15.0)
Immature Granulocytes: 0 %
Lymphocytes Relative: 31 %
Lymphs Abs: 1.8 10*3/uL (ref 0.7–4.0)
MCH: 26.1 pg (ref 26.0–34.0)
MCHC: 32.4 g/dL (ref 30.0–36.0)
MCV: 80.3 fL (ref 80.0–100.0)
Monocytes Absolute: 0.3 10*3/uL (ref 0.1–1.0)
Monocytes Relative: 5 %
Neutro Abs: 3.5 10*3/uL (ref 1.7–7.7)
Neutrophils Relative %: 61 %
Platelets: 336 10*3/uL (ref 150–400)
RBC: 4.22 MIL/uL (ref 3.87–5.11)
RDW: 13.7 % (ref 11.5–15.5)
WBC: 5.8 10*3/uL (ref 4.0–10.5)
nRBC: 0 % (ref 0.0–0.2)

## 2020-07-22 LAB — BASIC METABOLIC PANEL
Anion gap: 9 (ref 5–15)
BUN: 9 mg/dL (ref 8–23)
CO2: 23 mmol/L (ref 22–32)
Calcium: 8.9 mg/dL (ref 8.9–10.3)
Chloride: 105 mmol/L (ref 98–111)
Creatinine, Ser: 0.74 mg/dL (ref 0.44–1.00)
GFR, Estimated: >60 mL/min (ref >60)
Glucose, Bld: 106 mg/dL — ABNORMAL HIGH (ref 70–99)
Potassium: 3.7 mmol/L (ref 3.5–5.1)
Sodium: 137 mmol/L (ref 135–145)

## 2020-07-22 LAB — ECHOCARDIOGRAM COMPLETE
AR max vel: 2.16 cm2
AV Area VTI: 2.45 cm2
AV Area mean vel: 2.32 cm2
AV Mean grad: 3 mmHg
AV Peak grad: 5.4 mmHg
Ao pk vel: 1.16 m/s
Area-P 1/2: 3.91 cm2
Calc EF: 54 %
S' Lateral: 2.6 cm
Single Plane A2C EF: 47.5 %
Single Plane A4C EF: 57.7 %

## 2020-07-24 DIAGNOSIS — G4733 Obstructive sleep apnea (adult) (pediatric): Secondary | ICD-10-CM | POA: Diagnosis not present

## 2020-07-26 ENCOUNTER — Telehealth: Payer: Self-pay

## 2020-07-26 NOTE — Telephone Encounter (Signed)
Left message for the pt to call back to change her cardiac CT to a TEE scheduled for 08/05/20 arrival 7:30 am.

## 2020-07-27 ENCOUNTER — Telehealth: Payer: Self-pay | Admitting: Cardiology

## 2020-07-27 NOTE — Telephone Encounter (Signed)
PT is returning Ann's call

## 2020-07-27 NOTE — Telephone Encounter (Signed)
Left another message for the pt to call back.  I cancelled her CT 07/29/20 and COVID test.... pt still unaware.

## 2020-07-27 NOTE — Telephone Encounter (Signed)
Pt verbalized understanding of her TEE instructions for 08/05/20. Will call if she develops any questions or problems.

## 2020-07-29 ENCOUNTER — Ambulatory Visit: Admission: RE | Admit: 2020-07-29 | Payer: Medicare Other | Source: Ambulatory Visit

## 2020-08-04 ENCOUNTER — Other Ambulatory Visit: Admission: RE | Admit: 2020-08-04 | Payer: Medicare Other | Source: Ambulatory Visit

## 2020-08-05 ENCOUNTER — Ambulatory Visit (HOSPITAL_BASED_OUTPATIENT_CLINIC_OR_DEPARTMENT_OTHER)
Admission: RE | Admit: 2020-08-05 | Discharge: 2020-08-05 | Disposition: A | Discharge status: Home / Self Care | Payer: Medicare Other | Source: Ambulatory Visit | Attending: Cardiology | Admitting: Cardiology

## 2020-08-05 ENCOUNTER — Ambulatory Visit (HOSPITAL_COMMUNITY): Payer: Medicare Other

## 2020-08-05 ENCOUNTER — Encounter (HOSPITAL_COMMUNITY): Payer: Self-pay | Admitting: Cardiology

## 2020-08-05 ENCOUNTER — Ambulatory Visit (HOSPITAL_COMMUNITY)
Admission: RE | Admit: 2020-08-05 | Discharge: 2020-08-05 | Disposition: A | Discharge status: Home / Self Care | Payer: Medicare Other | Source: Ambulatory Visit | Attending: Cardiology | Admitting: Cardiology

## 2020-08-05 ENCOUNTER — Encounter (HOSPITAL_COMMUNITY)
Admission: RE | Disposition: A | Discharge status: Home / Self Care | Payer: Medicare Other | Source: Ambulatory Visit | Attending: Cardiology

## 2020-08-05 ENCOUNTER — Other Ambulatory Visit: Payer: Self-pay

## 2020-08-05 DIAGNOSIS — Z880 Allergy status to penicillin: Secondary | ICD-10-CM | POA: Insufficient documentation

## 2020-08-05 DIAGNOSIS — G4733 Obstructive sleep apnea (adult) (pediatric): Secondary | ICD-10-CM | POA: Diagnosis not present

## 2020-08-05 DIAGNOSIS — I4891 Unspecified atrial fibrillation: Secondary | ICD-10-CM

## 2020-08-05 DIAGNOSIS — I4819 Other persistent atrial fibrillation: Secondary | ICD-10-CM | POA: Insufficient documentation

## 2020-08-05 DIAGNOSIS — I10 Essential (primary) hypertension: Secondary | ICD-10-CM | POA: Diagnosis not present

## 2020-08-05 DIAGNOSIS — E559 Vitamin D deficiency, unspecified: Secondary | ICD-10-CM | POA: Diagnosis not present

## 2020-08-05 DIAGNOSIS — Z79899 Other long term (current) drug therapy: Secondary | ICD-10-CM | POA: Insufficient documentation

## 2020-08-05 DIAGNOSIS — Z885 Allergy status to narcotic agent status: Secondary | ICD-10-CM | POA: Insufficient documentation

## 2020-08-05 DIAGNOSIS — I4892 Unspecified atrial flutter: Secondary | ICD-10-CM | POA: Insufficient documentation

## 2020-08-05 DIAGNOSIS — Z881 Allergy status to other antibiotic agents status: Secondary | ICD-10-CM | POA: Diagnosis not present

## 2020-08-05 DIAGNOSIS — Z91013 Allergy to seafood: Secondary | ICD-10-CM | POA: Diagnosis not present

## 2020-08-05 DIAGNOSIS — Z882 Allergy status to sulfonamides status: Secondary | ICD-10-CM | POA: Diagnosis not present

## 2020-08-05 DIAGNOSIS — I340 Nonrheumatic mitral (valve) insufficiency: Secondary | ICD-10-CM

## 2020-08-05 DIAGNOSIS — Z888 Allergy status to other drugs, medicaments and biological substances status: Secondary | ICD-10-CM | POA: Insufficient documentation

## 2020-08-05 DIAGNOSIS — Z7901 Long term (current) use of anticoagulants: Secondary | ICD-10-CM | POA: Insufficient documentation

## 2020-08-05 DIAGNOSIS — I083 Combined rheumatic disorders of mitral, aortic and tricuspid valves: Secondary | ICD-10-CM | POA: Diagnosis not present

## 2020-08-05 HISTORY — PX: TEE WITHOUT CARDIOVERSION: SHX5443

## 2020-08-05 LAB — ECHO TEE: Radius: 0.5 cm

## 2020-08-05 SURGERY — ECHOCARDIOGRAM, TRANSESOPHAGEAL
Anesthesia: Monitor Anesthesia Care

## 2020-08-05 MED ORDER — SODIUM CHLORIDE 0.9 % IV SOLN: INTRAVENOUS | Status: DC

## 2020-08-05 MED ORDER — PROPOFOL 10 MG/ML IV BOLUS
INTRAVENOUS | Status: DC | PRN
  Administered 2020-08-05: 30 mg via INTRAVENOUS

## 2020-08-05 MED ORDER — PROPOFOL 500 MG/50ML IV EMUL
INTRAVENOUS | Status: DC | PRN
  Administered 2020-08-05: 125 ug/kg/min via INTRAVENOUS

## 2020-08-05 MED ORDER — BUTAMBEN-TETRACAINE-BENZOCAINE 2-2-14 % EX AERO
INHALATION_SPRAY | CUTANEOUS | Status: DC | PRN
  Administered 2020-08-05: 2 via TOPICAL

## 2020-08-05 NOTE — Anesthesia Preprocedure Evaluation (Signed)
Anesthesia Evaluation  Patient identified by MRN, date of birth, ID band Patient awake    History of Anesthesia Complications (+) PONV  Airway Mallampati: II  TM Distance: >3 FB Neck ROM: Full    Dental  (+) Teeth Intact   Pulmonary asthma , sleep apnea ,    Pulmonary exam normal        Cardiovascular hypertension, Pt. on medications and Pt. on home beta blockers + angina + DOE  + dysrhythmias Atrial Fibrillation  Rhythm:Irregular Rate:Normal     Neuro/Psych  Headaches, Anxiety Depression    GI/Hepatic PUD, GERD  Medicated,  Endo/Other  negative endocrine ROS  Renal/GU Renal disease  negative genitourinary   Musculoskeletal  (+) Arthritis ,   Abdominal (+)  Abdomen: soft. Bowel sounds: normal.  Peds  Hematology  (+) anemia ,   Anesthesia Other Findings   Reproductive/Obstetrics                             Anesthesia Physical Anesthesia Plan  ASA: III  Anesthesia Plan: MAC   Post-op Pain Management:    Induction: Intravenous  PONV Risk Score and Plan: 3 and Propofol infusion  Airway Management Planned: Simple Face Mask, Natural Airway and Nasal Cannula  Additional Equipment: None  Intra-op Plan:   Post-operative Plan:   Informed Consent: I have reviewed the patients History and Physical, chart, labs and discussed the procedure including the risks, benefits and alternatives for the proposed anesthesia with the patient or authorized representative who has indicated his/her understanding and acceptance.     Dental advisory given  Plan Discussed with:   Anesthesia Plan Comments:         Anesthesia Quick Evaluation

## 2020-08-05 NOTE — Interval H&P Note (Signed)
History and Physical Interval Note:  08/05/2020 7:55 AM  [name]  has presented today for surgery, with the diagnosis of pre ablation.  The various methods of treatment have been discussed with the patient and family. After consideration of risks, benefits and other options for treatment, the patient has consented to  Procedure(s): TRANSESOPHAGEAL ECHOCARDIOGRAM (TEE) (N/A) as a surgical intervention.  The patient's history has been reviewed, patient examined, no change in status, stable for surgery.  I have reviewed the patient's chart and labs.  Questions were answered to the patient's satisfaction.     Kirk Ruths

## 2020-08-05 NOTE — Anesthesia Postprocedure Evaluation (Signed)
Anesthesia Post Note  Patient: [name]  Procedure(s) Performed: TRANSESOPHAGEAL ECHOCARDIOGRAM (TEE) (N/A )     Patient location during evaluation: Endoscopy Anesthesia Type: MAC Level of consciousness: awake and alert Pain management: pain level controlled Vital Signs Assessment: post-procedure vital signs reviewed and stable Respiratory status: spontaneous breathing, nonlabored ventilation, respiratory function stable and patient connected to nasal cannula oxygen Cardiovascular status: stable and blood pressure returned to baseline Postop Assessment: no apparent nausea or vomiting Anesthetic complications: no   No complications documented.  Last Vitals:  Vitals:   08/05/20 0904 08/05/20 0915  BP: (!) 103/56 109/68  Pulse: (!) 59 70  Resp: 13 15  Temp: 36.6 C   SpO2: 99% 98%    Last Pain:  Vitals:   08/05/20 0915  TempSrc:   PainSc: 0-No pain                 Belenda Cruise P Drianna Chandran

## 2020-08-05 NOTE — Progress Notes (Signed)
  Echocardiogram Echocardiogram Transesophageal has been performed.  Bobbye Charleston 08/05/2020, 9:28 AM

## 2020-08-05 NOTE — Anesthesia Procedure Notes (Signed)
Procedure Name: MAC Date/Time: 08/05/2020 8:41 AM Performed by: Kathryne Hitch, CRNA Pre-anesthesia Checklist: Patient identified, Emergency Drugs available, Suction available and Patient being monitored Patient Re-evaluated:Patient Re-evaluated prior to induction Oxygen Delivery Method: Nasal cannula Preoxygenation: Pre-oxygenation with 100% oxygen Induction Type: IV induction Placement Confirmation: positive ETCO2 Dental Injury: Teeth and Oropharynx as per pre-operative assessment

## 2020-08-05 NOTE — Pre-Procedure Instructions (Signed)
Instructed patient on the following items: Arrival time 0530 Nothing to eat or drink after midnight No meds AM of procedure Responsible person to drive you home and stay with you for 24 hrs  Have you missed any doses of anti-coagulant Eliquis- hasn't missed any doses    

## 2020-08-05 NOTE — H&P (Signed)
Office Visit  06/30/2020 [place]   Shelby Mage T, MD  Cardiology  Typical atrial flutter [facility]) +2 more  Dx  Referred by Virginia Crews, MD  Reason for Visit    Additional Documentation  Vitals:  BP 120/74  Pulse 53Important   Ht 5\' 4"  (1.626 m)  Wt 64.4 kg  SpO2 99%  BMI 24.37 kg/m  BSA 1.71 m  Flowsheets:  NEWS,  MEWS Score,  Anthropometrics    Encounter Info:  Billing Info,  History,  Allergies,  Detailed Report     All Notes   Progress Notes by Vickie Epley, MD at 06/30/2020 10:00 AM  Author: Vickie Epley, MD Author Type: Physician Filed: 06/30/2020 11:12 AM  Note Status: Signed Cosign: Cosign Not Required Encounter Date: 06/30/2020  Editor: Vickie Epley, MD (Physician)             Expand AllCollapse All    Electrophysiology Office Follow up Visit Note:    Date:  06/30/2020   ID:  [name], DOB December 09, 1960, MRN [id number]  PCP:  Virginia Crews, MD       [facility] Cardiologist:  Kate Sable, MD     Interval History:    [name] is a 60 y.o. female who presents for a follow up visit.  I last saw the patient May 26, 2020 as an acute visit for lightheadedness.  This was thought to be related to her anxiety and depression rather than AV conduction disease.  She was then seen by Dr. Caryl Comes on June 01, 2020 who agreed with that assessment but wanted to have her considered for atrial fibrillation/flutter ablation in an effort to avoid long-term use of amiodarone.  She presents to clinic to discuss this.  She tells me she has been doing okay since I last saw her.  She continues to take amiodarone 200 mg by mouth once daily and Eliquis 5 mg by mouth twice daily for stroke prophylaxis.  No bleeding troubles.  She is very interested/motivated to get off amiodarone.         Past Medical History:  Diagnosis Date  . A-fib ([place]) 2021  . Abnormal EKG    HX OF INVERTED Cooley  WAVES ON EKG, PALPITATIONS, CHEST PAINS-CARDIAC WORK UP DID NOT SHOW ANY HEART DISEASE  . AC (acromioclavicular) joint bone spurs   . Acute postoperative pain 01/04/2017  . 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 Anemia    Iron Infusion-8 yrs ago  . Anxiety   . Asthma   . Cephalalgia 08/18/2014  . Cervical disc disease 08/18/2014   Needs neck surgery.   . Chronic headaches   . Chronic, continuous use of opioids   . DDD (degenerative disc disease)    CERVICAL AND LUMBAR-CHRONIC PAIN, RT HIP LABRAL TEAR  . Depression    PT STATES A LOT OF STRESS IN HER LIFE  . Dissociative disorder   . Dizziness 04/22/2013  . Duodenal ulcer with hemorrhage and perforation ([place]) 04/27/2003  . Foot drop, right    FROM BACK SURGERY  . GERD (gastroesophageal reflux disease)   . H/O arthrodesis 08/18/2014  . Headache(784.0)    AND NECK PAIN--STATES RECENT TEST SHOW CERVICAL DEGENERATION  . History of blood transfusion    s/p back surgery  . History of cardioversion   . History of cervical spinal surgery 01/04/2015  . History of kidney stones   . Hypertension   . Inverted Cooley wave   . Narrowing  of intervertebral disc space 08/18/2014   Currently on disability.   . Orthostatic hypotension 04/22/2013  . Pain    CHRONIC NECK AND BACK PAIN - LIMITED ROM NECK - S/P FUSIONS CERVICAL AND LUMBAR  . Pneumonia   . PONV (postoperative nausea and vomiting)    PT GIVES HX OF N&V AND FEVER WITH SURGERIES YEARS AGO--BUT NO PROBLEMS WITH MORE RECENT SURGERIES--STATES NOT MALIGNANT HYPERTHERMIA  . Postop Hyponatremia 05/14/2012  . Postoperative anemia due to acute blood loss 05/14/2012  . PTSD (post-traumatic stress disorder)   . Right foot drop   . 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   . Sleep apnea 2021   does not have a cpap  . Sleep apnea   . Therapeutic opioid-induced constipation (OIC)          Past Surgical History:  Procedure Laterality Date   . ABDOMINAL HYSTERECTOMY    . afib  05/2019  . ANTERIOR CERVICAL DECOMP/DISCECTOMY FUSION N/A 10/30/2012   Procedure: ACDF C5-6, EXPLORATION AND HARDWARE REMOVAL C6-7;  Surgeon: Melina Schools, MD;  Location: [place];  Service: Orthopedics;  Laterality: N/A;  . ANTERIOR FUSION CERVICAL SPINE  MAY 2012   AT [facility]  . BACK SURGERY  2009   LUMBAR FUSION WITH RODS   . BREAST BIOPSY Right 2008   benign.- bx/clip  . BREAST EXCISIONAL BIOPSY Left 1998   benign  . BREAST REDUCTION SURGERY Bilateral 06/2016  . CARDIOVERSION N/A 12/03/2019   Procedure: CARDIOVERSION;  Surgeon: Kate Sable, MD;  Location: [facility];  Service: Cardiovascular;  Laterality: N/A;  . CARPAL TUNNEL RELEASE  05-06-12   Right  . CHOLECYSTECTOMY    . COLONOSCOPY WITH PROPOFOL N/A 01/26/2017   Procedure: COLONOSCOPY WITH PROPOFOL;  Surgeon: Lucilla Lame, MD;  Location: [place];  Service: Endoscopy;  Laterality: N/A;  . DIAGNOSTIC LAPAROSCOPIES - MULTIPLE FOR ENDOMETRIOSIS    . ESOPHAGEAL DILATION N/A 01/26/2017   Procedure: ESOPHAGEAL DILATION;  Surgeon: Lucilla Lame, MD;  Location: [place];  Service: Endoscopy;  Laterality: N/A;  . ESOPHAGEAL MANOMETRY N/A 05/30/2017   Procedure: ESOPHAGEAL MANOMETRY (EM);  Surgeon: Lucilla Lame, MD;  Location: [facility];  Service: Endoscopy;  Laterality: N/A;  . ESOPHAGOGASTRODUODENOSCOPY (EGD) WITH PROPOFOL N/A 01/26/2017   Procedure: ESOPHAGOGASTRODUODENOSCOPY (EGD) WITH PROPOFOL;  Surgeon: Lucilla Lame, MD;  Location: [place];  Service: Endoscopy;  Laterality: N/A;  . ESOPHAGOGASTRODUODENOSCOPY (EGD) WITH PROPOFOL N/A 01/30/2020   Procedure: ESOPHAGOGASTRODUODENOSCOPY (EGD) WITH PROPOFOL;  Surgeon: Lucilla Lame, MD;  Location: [place];  Service: Endoscopy;  Laterality: N/A;  sleep apnea COVID + 01-15-20  . HIP ARTHROSCOPY  09/20/2011   Procedure: ARTHROSCOPY HIP;  Surgeon: Gearlean Alf, MD;  Location: [facility];  Service: Orthopedics;  Laterality: Right;  Right Hip Scope with Labral Debridement  . LEFT HEART CATH AND CORONARY ANGIOGRAPHY Left 06/10/2019   Procedure: LEFT HEART CATH AND CORONARY ANGIOGRAPHY;  Surgeon: Nelva Bush, MD;  Location: [place] [facility];  Service: Cardiovascular;  Laterality: Left;  . NASAL SEPTUM SURGERY  MARCH 2013   IN [place]  . [place] IMPEDANCE STUDY N/A 05/30/2017   Procedure: [place] IMPEDANCE STUDY;  Surgeon: Lucilla Lame, MD;  Location: [facility];  Service: Endoscopy;  Laterality: N/A;  . POLYPECTOMY N/A 01/26/2017   Procedure: POLYPECTOMY;  Surgeon: Lucilla Lame, MD;  Location: [place];  Service: Endoscopy;  Laterality: N/A;  . POSTERIOR CERVICAL FUSION/FORAMINOTOMY N/A 04/16/2013   Procedure:  REMOVAL CERVICAL PLATES AND INTERBODY CAGE/POSTERIOR CERVICAL SPINAL FUSION C4 - C6/C5 CORPECTOMY/C4 - C6 FUSION WITH ILIAC CREST BONE GRAFT;  Surgeon: Melina Schools, MD;  Location: [place];  Service: Orthopedics;  Laterality: N/A;  . RADIOFREQUENCY ABLATION NERVES    . REDUCTION MAMMAPLASTY Bilateral 05/2016  . RIGHT HIP ARTHROSCOPY FOR LABRAL TEAR  ABOUT 2010   2012 also  . SHOULDER ARTHROSCOPY  05-06-12   bone spur  . TONSILLECTOMY     AS A CHILD  . TOTAL HIP ARTHROPLASTY Right 05/13/2012   Procedure: TOTAL HIP ARTHROPLASTY ANTERIOR APPROACH;  Surgeon: Gearlean Alf, MD;  Location: [facility];  Service: Orthopedics;  Laterality: Right;    Current Medications: Active Medications      Current Meds  Medication Sig  . acetaminophen (TYLENOL) 500 MG tablet Take 500-1,000 mg by mouth every 6 (six) hours as needed for moderate pain.   Marland Kitchen ALPRAZolam (XANAX) 1 MG tablet Take 1 mg by mouth at bedtime.   Marland Kitchen amiodarone (PACERONE) 200 MG tablet Take 1 tablet (200 mg total) by mouth daily.  Marland Kitchen apixaban (ELIQUIS) 5 MG TABS tablet Take 1 tablet (5 mg total) by mouth 2 (two) times daily.  . busPIRone (BUSPAR) 7.5 MG tablet Take 7.5 mg by mouth 2 (two)  times daily.  Marland Kitchen linaclotide (LINZESS) 290 MCG CAPS capsule Take 1 capsule (290 mcg total) by mouth daily before breakfast.  . Menthol, Topical Analgesic, (BENGAY EX) Apply 1 application topically daily as needed (pain).  . montelukast (SINGULAIR) 10 MG tablet TAKE 1 TABLET BY MOUTH EVERY DAY  . morphine (MSIR) 15 MG tablet Take 1 tablet (15 mg total) by mouth every 6 (six) hours as needed for moderate pain or severe pain. Must last 30 days.  Derrill Memo ON 07/20/2020] morphine (MSIR) 15 MG tablet Take 1 tablet (15 mg total) by mouth every 6 (six) hours as needed for moderate pain or severe pain. Must last 30 days.  . ondansetron (ZOFRAN ODT) 4 MG disintegrating tablet Take 1 tablet (4 mg total) by mouth every 8 (eight) hours as needed for nausea or vomiting.  . pantoprazole (PROTONIX) 40 MG tablet TAKE 1 TABLET BY MOUTH TWICE A DAY  . polyethylene glycol (MIRALAX / GLYCOLAX) 17 g packet Take 17 g by mouth daily as needed.  . valACYclovir (VALTREX) 1000 MG tablet TAKE TWO TABLETS BY MOUTH TWICE DAILY FOR ONE DAY FEVER BLISTER       Allergies:   Amoxicillin, Chlorhexidine gluconate, Clindamycin, Codeine, Erythromycin, Penicillin g, Sulfa antibiotics, Levofloxacin, Shellfish allergy, Decadron [dexamethasone], Mangifera indica, Papaya derivatives, Betadine [povidone iodine], Clarithromycin, Other, Povidone-iodine, and Prednisone   Social History        Socioeconomic History  . Marital status: Married    Spouse name: bruce  . Number of children: 2  . Years of education: Not on file  . Highest education level: Associate degree: occupational, Hotel manager, or vocational program  Occupational History    Comment: disabled  Tobacco Use  . Smoking status: Never Smoker  . Smokeless tobacco: Never Used  Vaping Use  . Vaping Use: Never used  Substance and Sexual Activity  . Alcohol use: No    Alcohol/week: 0.0 standard drinks  . Drug use: Yes    Comment: prescribed morphine and xanax  .  Sexual activity: Yes  Other Topics Concern  . Not on file  Social History Narrative   Son, daughter  And 4 grandkids; raises 3 of the grandchildren   Social Determinants of Health  Financial Resource Strain: High Risk  . Difficulty of Paying Living Expenses: Very hard  Food Insecurity: Food Insecurity Present  . Worried About Charity fundraiser in the Last Year: Sometimes true  . Ran Out of Food in the Last Year: Often true  Transportation Needs: Unmet Transportation Needs  . Lack of Transportation (Medical): Yes  . Lack of Transportation (Non-Medical): Yes  Physical Activity: Inactive  . Days of Exercise per Week: 0 days  . Minutes of Exercise per Session: 0 min  Stress: Stress Concern Present  . Feeling of Stress : Very much  Social Connections: Moderately Isolated  . Frequency of Communication with Friends and Family: More than three times a week  . Frequency of Social Gatherings with Friends and Family: Never  . Attends Religious Services: Never  . Active Member of Clubs or Organizations: No  . Attends Archivist Meetings: Never  . Marital Status: Married     Family History: The patient's family history includes Aneurysm in her maternal grandmother and mother; Anxiety disorder in her grandchild; Bipolar disorder in her grandchild and sister; Breast cancer (age of onset: 20) in her paternal grandmother; Depression in her grandchild.  ROS:   Please see the history of present illness.    All other systems reviewed and are negative.  EKGs/Labs/Other Studies Reviewed:    The following studies were reviewed today:   EKG:  The ekg ordered today demonstrates sinus rhythm with a ventricular rate of 53 bpm  Recent Labs: 07/10/2019: B Natriuretic Peptide 395.0; TSH 1.424 05/01/2020: ALT 13; BUN 12; Creatinine, Ser 1.01; Hemoglobin 12.4; Platelets 409; Potassium 3.5; Sodium 133  Recent Lipid Panel Labs (Brief)          Component Value Date/Time    CHOL 183 01/13/2020 1117   TRIG 116 01/13/2020 1117   HDL 64 01/13/2020 1117   CHOLHDL 2.9 01/13/2020 1117   LDLCALC 99 01/13/2020 1117      Physical Exam:    VS:  BP 120/74   Pulse (!) 53   Ht 5\' 4"  (1.626 m)   Wt 142 lb (64.4 kg)   SpO2 99%   BMI 24.37 kg/m        Wt Readings from Last 3 Encounters:  06/30/20 142 lb (64.4 kg)  06/01/20 142 lb 4 oz (64.5 kg)  05/26/20 142 lb 12.8 oz (64.8 kg)     GEN:  Well nourished, well developed in no acute distress HEENT: Normal NECK: No JVD; No carotid bruits LYMPHATICS: No lymphadenopathy CARDIAC: RRR, no murmurs, rubs, gallops RESPIRATORY:  Clear to auscultation without rales, wheezing or rhonchi  ABDOMEN: Soft, non-tender, non-distended MUSCULOSKELETAL:  No edema; No deformity  SKIN: Warm and dry NEUROLOGIC:  Alert and oriented x 3 PSYCHIATRIC:  Normal affect   ASSESSMENT:    1. Typical atrial flutter (HCC)   2. Persistent atrial fibrillation ([place])   3. Essential hypertension    PLAN:    In order of problems listed above:  1. Persistent atrial fibrillation and typical atrial flutter Maintaining sinus rhythm on amiodarone.  The patient would like to pursue a strategy for rhythm control but avoid long-term exposure to amiodarone.  I discussed ablation in detail during today's visit including the risk, expected efficacy and recovery time.  She would like to proceed with scheduling.  We will need to repeat her echocardiogram and obtain a CT PE protocol to evaluate her left atrial anatomy.  She understands this.  She will continue to take her  Eliquis uninterrupted.  Risk, benefits, and alternatives to EP study and radiofrequency ablation for afib were also discussed in detail today. These risks include but are not limited to stroke, bleeding, vascular damage, tamponade, perforation, damage to the esophagus, lungs, and other structures, pulmonary vein stenosis, worsening renal function, and death. The patient  understands these risk and wishes to proceed.  We will therefore proceed with catheter ablation at the next available time.  Carto, ICE, anesthesia are requested for the procedure.  Will also obtain CT PV protocol prior to the procedure to exclude LAA thrombus and further evaluate atrial anatomy.  Ablation strategy will be PVI plus posterior wall plus CTI.  We will plan to continue the amiodarone for at least 3 months following ablation.  If she is doing well for 55-month mark after ablation we will discontinue the amiodarone.  2.  Hypertension Controlled.  Continue current regimen.  Total time spent with patient today 40 minutes. This includes reviewing records, evaluating the patient and coordinating care.   Medication Adjustments/Labs and Tests Ordered: Current medicines are reviewed at length with the patient today.  Concerns regarding medicines are outlined above.     Orders Placed This Encounter  Procedures  . CT CARDIAC MORPH/PULM VEIN W/CM&W/O CA SCORE  . Basic Metabolic Panel (BMET)  . CBC w/Diff  . EKG 12-Lead  . ECHOCARDIOGRAM COMPLETE   No orders of the defined types were placed in this encounter.    Signed, Shelby Mage, MD, [facility], [facility] 06/30/2020 10:44 AM    Electrophysiology [place]       Patient is scheduled for ablation of atrial fibrillation/flutter.  She presents for transesophageal echocardiogram prior to procedure.  She is without complaints today.  Physical exam is unchanged.  I have reviewed her past medical history, social history, family history and agree.  Plan to proceed with transesophageal echocardiogram as planned. Kirk Ruths

## 2020-08-05 NOTE — Anesthesia Preprocedure Evaluation (Addendum)
Anesthesia Evaluation  Patient identified by MRN, date of birth, ID band Patient awake    Reviewed: Allergy & Precautions, NPO status , Patient's Chart, lab work & pertinent test results  History of Anesthesia Complications Negative for: history of anesthetic complications  Airway Mallampati: II  TM Distance: >3 FB Neck ROM: Full    Dental  (+) Teeth Intact, Dental Advisory Given,    Pulmonary asthma , sleep apnea ,    Pulmonary exam normal        Cardiovascular hypertension, Pt. on medications and Pt. on home beta blockers Normal cardiovascular exam+ dysrhythmias Atrial Fibrillation      Neuro/Psych  Headaches, Anxiety Depression    GI/Hepatic Neg liver ROS, PUD, GERD  ,  Endo/Other  negative endocrine ROS  Renal/GU negative Renal ROS  negative genitourinary   Musculoskeletal  (+) Arthritis , narcotic dependent  Abdominal   Peds  Hematology negative hematology ROS (+) Eliquis   Anesthesia Other Findings   Reproductive/Obstetrics                           Anesthesia Physical Anesthesia Plan  ASA: III  Anesthesia Plan: General   Post-op Pain Management:    Induction: Intravenous  PONV Risk Score and Plan: 3 and Ondansetron, Treatment may vary due to age or medical condition and Midazolam  Airway Management Planned: Oral ETT  Additional Equipment: None  Intra-op Plan:   Post-operative Plan: Extubation in OR  Informed Consent: I have reviewed the patients History and Physical, chart, labs and discussed the procedure including the risks, benefits and alternatives for the proposed anesthesia with the patient or authorized representative who has indicated his/her understanding and acceptance.     Dental advisory given  Plan Discussed with:   Anesthesia Plan Comments:        Anesthesia Quick Evaluation

## 2020-08-05 NOTE — Transfer of Care (Signed)
Immediate Anesthesia Transfer of Care Note  Patient: [name]  Procedure(s) Performed: TRANSESOPHAGEAL ECHOCARDIOGRAM (TEE) (N/A )  Patient Location: Endoscopy Unit  Anesthesia Type:MAC  Level of Consciousness: drowsy and patient cooperative  Airway & Oxygen Therapy: Patient Spontanous Breathing and Patient connected to nasal cannula oxygen  Post-op Assessment: Report given to RN and Post -op Vital signs reviewed and stable  Post vital signs: Reviewed and stable  Last Vitals:  Vitals Value Taken Time  BP 103/56   Temp    Pulse 60   Resp 14   SpO2 97     Last Pain:  Vitals:   08/05/20 0743  TempSrc: Temporal         Complications: No complications documented.

## 2020-08-05 NOTE — Discharge Instructions (Signed)

## 2020-08-05 NOTE — Progress Notes (Signed)
    Transesophageal Echocardiogram Note  MENUCHA DICESARE [id number] 1960/10/06  Procedure: Transesophageal Echocardiogram Indications: Atrial fibrillation/atrial flutter  Procedure Details Consent: Obtained Time Out: Verified patient identification, verified procedure, site/side was marked, verified correct patient position, special equipment/implants available, Radiology Safety Procedures followed,  medications/allergies/relevent history reviewed, required imaging and test results available.  Performed  Medications:  Pt sedated by anesthesia with diprovan 230 mg IV total.  Normal LV function; mild LAE; no LAA thrombus; probable Lambl's excrescence noted on left coronary cusp with trace AI; mild MR and TR.   Complications: No apparent complications Patient did tolerate procedure well.  Kirk Ruths, MD

## 2020-08-06 ENCOUNTER — Other Ambulatory Visit: Payer: Self-pay

## 2020-08-06 ENCOUNTER — Ambulatory Visit (HOSPITAL_COMMUNITY): Payer: Medicare Other | Admitting: Certified Registered Nurse Anesthetist

## 2020-08-06 ENCOUNTER — Encounter (HOSPITAL_COMMUNITY)
Admission: RE | Disposition: A | Discharge status: Home / Self Care | Payer: Medicare Other | Source: Home / Self Care | Attending: Cardiology

## 2020-08-06 ENCOUNTER — Ambulatory Visit (HOSPITAL_COMMUNITY)
Admission: RE | Admit: 2020-08-06 | Discharge: 2020-08-06 | Disposition: A | Discharge status: Home / Self Care | Payer: Medicare Other | Attending: Cardiology | Admitting: Cardiology

## 2020-08-06 ENCOUNTER — Encounter (HOSPITAL_COMMUNITY): Payer: Self-pay | Admitting: Cardiology

## 2020-08-06 DIAGNOSIS — I4892 Unspecified atrial flutter: Secondary | ICD-10-CM | POA: Diagnosis not present

## 2020-08-06 DIAGNOSIS — E559 Vitamin D deficiency, unspecified: Secondary | ICD-10-CM | POA: Diagnosis not present

## 2020-08-06 DIAGNOSIS — G4733 Obstructive sleep apnea (adult) (pediatric): Secondary | ICD-10-CM | POA: Diagnosis not present

## 2020-08-06 DIAGNOSIS — I10 Essential (primary) hypertension: Secondary | ICD-10-CM | POA: Diagnosis not present

## 2020-08-06 DIAGNOSIS — I483 Typical atrial flutter: Secondary | ICD-10-CM | POA: Diagnosis not present

## 2020-08-06 DIAGNOSIS — I4819 Other persistent atrial fibrillation: Secondary | ICD-10-CM | POA: Diagnosis not present

## 2020-08-06 DIAGNOSIS — Z7901 Long term (current) use of anticoagulants: Secondary | ICD-10-CM | POA: Insufficient documentation

## 2020-08-06 HISTORY — PX: ATRIAL FIBRILLATION ABLATION: EP1191

## 2020-08-06 LAB — POCT ACTIVATED CLOTTING TIME
Activated Clotting Time: 279 seconds
Activated Clotting Time: 315 seconds
Activated Clotting Time: 351 seconds

## 2020-08-06 SURGERY — ATRIAL FIBRILLATION ABLATION
Anesthesia: General

## 2020-08-06 MED ORDER — FENTANYL CITRATE (PF) 100 MCG/2ML IJ SOLN
INTRAMUSCULAR | Status: DC | PRN
  Administered 2020-08-06: 100 ug via INTRAVENOUS

## 2020-08-06 MED ORDER — ONDANSETRON HCL 4 MG/2ML IJ SOLN
4.0000 mg | Freq: Four times a day (QID) | INTRAMUSCULAR | Status: DC | PRN
  Administered 2020-08-06: 4 mg via INTRAVENOUS
  Filled 2020-08-06: qty 2

## 2020-08-06 MED ORDER — ROCURONIUM BROMIDE 10 MG/ML (PF) SYRINGE
PREFILLED_SYRINGE | INTRAVENOUS | Status: DC | PRN
  Administered 2020-08-06: 70 mg via INTRAVENOUS

## 2020-08-06 MED ORDER — MORPHINE SULFATE 15 MG PO TABS
15.0000 mg | ORAL_TABLET | Freq: Once | ORAL | Status: AC
  Administered 2020-08-06: 15 mg via ORAL
  Filled 2020-08-06: qty 1

## 2020-08-06 MED ORDER — ONDANSETRON HCL 4 MG/2ML IJ SOLN
INTRAMUSCULAR | Status: DC | PRN
  Administered 2020-08-06: 4 mg via INTRAVENOUS

## 2020-08-06 MED ORDER — SODIUM CHLORIDE 0.9 % IV SOLN: 250.0000 mL | INTRAVENOUS | Status: DC | PRN

## 2020-08-06 MED ORDER — SODIUM CHLORIDE 0.9% FLUSH: 3.0000 mL | INTRAVENOUS | Status: DC | PRN

## 2020-08-06 MED ORDER — HEPARIN SODIUM (PORCINE) 1000 UNIT/ML IJ SOLN
INTRAMUSCULAR | Status: AC
  Filled 2020-08-06: qty 1

## 2020-08-06 MED ORDER — SODIUM CHLORIDE 0.9 % IV SOLN: INTRAVENOUS | Status: DC

## 2020-08-06 MED ORDER — LIDOCAINE 2% (20 MG/ML) 5 ML SYRINGE
INTRAMUSCULAR | Status: DC | PRN
  Administered 2020-08-06: 100 mg via INTRAVENOUS

## 2020-08-06 MED ORDER — HEPARIN (PORCINE) IN NACL 1000-0.9 UT/500ML-% IV SOLN
INTRAVENOUS | Status: AC
  Filled 2020-08-06: qty 500

## 2020-08-06 MED ORDER — ACETAMINOPHEN 325 MG PO TABS
650.0000 mg | ORAL_TABLET | ORAL | Status: DC | PRN
  Filled 2020-08-06: qty 2

## 2020-08-06 MED ORDER — PROPOFOL 10 MG/ML IV BOLUS
INTRAVENOUS | Status: DC | PRN
  Administered 2020-08-06: 50 mg via INTRAVENOUS
  Administered 2020-08-06: 150 mg via INTRAVENOUS

## 2020-08-06 MED ORDER — PHENYLEPHRINE HCL-NACL 10-0.9 MG/250ML-% IV SOLN
INTRAVENOUS | Status: DC | PRN
  Administered 2020-08-06: 30 ug/min via INTRAVENOUS

## 2020-08-06 MED ORDER — SUGAMMADEX SODIUM 200 MG/2ML IV SOLN
INTRAVENOUS | Status: DC | PRN
  Administered 2020-08-06: 200 mg via INTRAVENOUS

## 2020-08-06 MED ORDER — HEPARIN (PORCINE) IN NACL 1000-0.9 UT/500ML-% IV SOLN
INTRAVENOUS | Status: DC | PRN
  Administered 2020-08-06: 500 mL

## 2020-08-06 MED ORDER — HEPARIN SODIUM (PORCINE) 1000 UNIT/ML IJ SOLN
INTRAMUSCULAR | Status: DC | PRN
  Administered 2020-08-06: 1000 [IU] via INTRAVENOUS

## 2020-08-06 MED ORDER — APIXABAN 5 MG PO TABS
5.0000 mg | ORAL_TABLET | Freq: Two times a day (BID) | ORAL | Status: DC
  Administered 2020-08-06: 5 mg via ORAL
  Filled 2020-08-06: qty 1

## 2020-08-06 MED ORDER — HEPARIN (PORCINE) IN NACL 1000-0.9 UT/500ML-% IV SOLN
INTRAVENOUS | Status: DC | PRN
  Administered 2020-08-06 (×2): 500 mL

## 2020-08-06 MED ORDER — HEPARIN SODIUM (PORCINE) 1000 UNIT/ML IJ SOLN
INTRAMUSCULAR | Status: DC | PRN
  Administered 2020-08-06: 3000 [IU] via INTRAVENOUS
  Administered 2020-08-06: 9000 [IU] via INTRAVENOUS
  Administered 2020-08-06: 3000 [IU] via INTRAVENOUS

## 2020-08-06 MED ORDER — LIDOCAINE HCL (PF) 1 % IJ SOLN
INTRAMUSCULAR | Status: AC
  Filled 2020-08-06: qty 30

## 2020-08-06 MED ORDER — SODIUM CHLORIDE 0.9% FLUSH: 3.0000 mL | Freq: Two times a day (BID) | INTRAVENOUS | Status: DC

## 2020-08-06 MED ORDER — PROTAMINE SULFATE 10 MG/ML IV SOLN
INTRAVENOUS | Status: DC | PRN
  Administered 2020-08-06 (×3): 10 mg via INTRAVENOUS

## 2020-08-06 MED ORDER — ISOPROTERENOL HCL 0.2 MG/ML IJ SOLN
INTRAMUSCULAR | Status: AC
  Filled 2020-08-06: qty 5

## 2020-08-06 SURGICAL SUPPLY — 20 items
BLANKET WARM UNDERBOD FULL ACC (MISCELLANEOUS) ×3 IMPLANT
CATH MAPPNG PENTARAY F 2-6-2MM (CATHETERS) IMPLANT
CATH S CIRCA THERM PROBE 10F (CATHETERS) ×1 IMPLANT
CATH SMTCH THERMOCOOL SF DF (CATHETERS) ×1 IMPLANT
CATH SOUNDSTAR ECO 8FR (CATHETERS) ×1 IMPLANT
CATH WEBSTER BI DIR CS D-F CRV (CATHETERS) ×1 IMPLANT
CLOSURE PERCLOSE PROSTYLE (VASCULAR PRODUCTS) ×3 IMPLANT
COVER SWIFTLINK CONNECTOR (BAG) ×2 IMPLANT
MAT PREVALON FULL STRYKER (MISCELLANEOUS) ×1 IMPLANT
PACK EP LATEX FREE (CUSTOM PROCEDURE TRAY) ×2
PACK EP LF (CUSTOM PROCEDURE TRAY) ×1 IMPLANT
PAD PRO RADIOLUCENT 2001M-C (PAD) ×2 IMPLANT
PATCH CARTO3 (PAD) ×1 IMPLANT
PENTARAY F 2-6-2MM (CATHETERS) ×2
SHEATH BAYLIS TRANSSEPTAL 98CM (NEEDLE) ×1 IMPLANT
SHEATH CARTO VIZIGO SM CVD (SHEATH) ×1 IMPLANT
SHEATH PINNACLE 8F 10CM (SHEATH) ×2 IMPLANT
SHEATH PINNACLE VASC 9FR (SHEATH) ×1 IMPLANT
SHEATH PROBE COVER 6X72 (BAG) ×1 IMPLANT
TUBING SMART ABLATE COOLFLOW (TUBING) ×1 IMPLANT

## 2020-08-06 NOTE — Discharge Instructions (Signed)
Post procedure care instructions No driving for 4 days. No lifting over 5 lbs for 1 week. No vigorous or sexual activity for 1 week. You may return to work/your usual activities on 08/14/20. Keep procedure site clean & dry. If you notice increased pain, swelling, bleeding or pus, call/return!  You may shower after 24 hours, but no soaking in baths/hot tubs/pools for 1 week.     Cardiac Ablation, Care After  This sheet gives you information about how to care for yourself after your procedure. Your health care provider may also give you more specific instructions. If you have problems or questions, contact your health care provider. What can I expect after the procedure? After the procedure, it is common to have:  Bruising around your puncture site.  Tenderness around your puncture site.  Skipped heartbeats.  Tiredness (fatigue).  Follow these instructions at home: Puncture site care   Follow instructions from your health care provider about how to take care of your puncture site. Make sure you: ? If present, leave stitches (sutures), skin glue, or adhesive strips in place. These skin closures may need to stay in place for up to 2 weeks. If adhesive strip edges start to loosen and curl up, you may trim the loose edges. Do not remove adhesive strips completely unless your health care provider tells you to do that. ? If a large square bandage is present, this may be removed 24 hours after surgery.   Check your puncture site every day for signs of infection. Check for: ? Redness, swelling, or pain. ? Fluid or blood. If your puncture site starts to bleed, lie down on your back, apply firm pressure to the area, and contact your health care provider. ? Warmth. ? Pus or a bad smell. Driving  Do not drive for at least 4 days after your procedure or however long your health care provider recommends. (Do not resume driving if you have previously been instructed not to drive for other health  reasons.)  Do not drive or use heavy machinery while taking prescription pain medicine. Activity  Avoid activities that take a lot of effort for at least 7 days after your procedure.  Do not lift anything that is heavier than 5 lb (4.5 kg) for one week.   No sexual activity for 1 week.   Return to your normal activities as told by your health care provider. Ask your health care provider what activities are safe for you. General instructions  Take over-the-counter and prescription medicines only as told by your health care provider.  Do not use any products that contain nicotine or tobacco, such as cigarettes and e-cigarettes. If you need help quitting, ask your health care provider.  You may shower after 24 hours, but Do not take baths, swim, or use a hot tub for 1 week.   Do not drink alcohol for 24 hours after your procedure.  Keep all follow-up visits as told by your health care provider. This is important. Contact a health care provider if:  You have redness, mild swelling, or pain around your puncture site.  You have fluid or blood coming from your puncture site that stops after applying firm pressure to the area.  Your puncture site feels warm to the touch.  You have pus or a bad smell coming from your puncture site.  You have a fever.  You have chest pain or discomfort that spreads to your neck, jaw, or arm.  You are sweating a lot.  You feel nauseous.  You have a fast or irregular heartbeat.  You have shortness of breath.  You are dizzy or light-headed and feel the need to lie down.  You have pain or numbness in the arm or leg closest to your puncture site. Get help right away if:  Your puncture site suddenly swells.  Your puncture site is bleeding and the bleeding does not stop after applying firm pressure to the area. These symptoms may represent a serious problem that is an emergency. Do not wait to see if the symptoms will go away. Get medical help  right away. Call your local emergency services (911 in the [place] Do not drive yourself to the hospital. Summary  After the procedure, it is normal to have bruising and tenderness at the puncture site in your groin, neck, or forearm.  Check your puncture site every day for signs of infection.  Get help right away if your puncture site is bleeding and the bleeding does not stop after applying firm pressure to the area. This is a medical emergency. This information is not intended to replace advice given to you by your health care provider. Make sure you discuss any questions you have with your health care provider.   You have an appointment set up with the [facility].  Multiple studies have shown that being followed by a dedicated atrial fibrillation clinic in addition to the standard care you receive from your other physicians improves health. We believe that enrollment in the atrial fibrillation clinic will allow [place] to better care for you.   The phone number to the [facility] is [phone]. The clinic is staffed Monday through Friday from 8:30am to 5pm.  Parking Directions: The clinic is located in the Heart and Vascular Building connected to [facility]. 1)From [address] turn on to [address] and go to the 3rd entrance  (Heart and Vascular entrance) on the right. 2)Look to the right for Heart &Vascular Parking Garage. 3)A code for the entrance is required, for June is 4233 4)Take the elevators to the 1st floor. Registration is in the room with the glass walls at the end of the hallway.  If you have any trouble parking or locating the clinic, please don't hesitate to call [phone].

## 2020-08-06 NOTE — Transfer of Care (Signed)
Immediate Anesthesia Transfer of Care Note  Patient: [name]  Procedure(s) Performed: ATRIAL FIBRILLATION ABLATION (N/A )  Patient Location: Cath Lab  Anesthesia Type:General  Level of Consciousness: awake and alert   Airway & Oxygen Therapy: Patient Spontanous Breathing  Post-op Assessment: Report given to RN, Post -op Vital signs reviewed and stable and Patient moving all extremities X 4  Post vital signs: Reviewed and stable  Last Vitals:  Vitals Value Taken Time  BP 104/48 08/06/20 1118  Temp 36.4 C 08/06/20 1119  Pulse 64 08/06/20 1120  Resp 12 08/06/20 1120  SpO2 99 % 08/06/20 1120  Vitals shown include unvalidated device data.  Last Pain:  Vitals:   08/06/20 1119  TempSrc: Temporal  PainSc: 8       Patients Stated Pain Goal: 6 (00/76/22 6333)  Complications: No complications documented.

## 2020-08-06 NOTE — Anesthesia Postprocedure Evaluation (Signed)
Anesthesia Post Note  Patient: [name]  Procedure(s) Performed: ATRIAL FIBRILLATION ABLATION (N/A )     Patient location during evaluation: PACU Anesthesia Type: General Level of consciousness: awake and alert Pain management: pain level controlled Vital Signs Assessment: post-procedure vital signs reviewed and stable Respiratory status: spontaneous breathing, nonlabored ventilation and respiratory function stable Cardiovascular status: blood pressure returned to baseline and stable Postop Assessment: no apparent nausea or vomiting Anesthetic complications: no   No complications documented.  Last Vitals:  Vitals:   08/06/20 1150 08/06/20 1157  BP: (!) 139/91 110/74  Pulse: 65 68  Resp: (!) 9 12  Temp:    SpO2: 99% 99%    Last Pain:  Vitals:   08/06/20 1157  TempSrc:   PainSc: 9                  Deaken Jurgens E Kathlean Cinco

## 2020-08-06 NOTE — H&P (Signed)
1. Persistent atrial fibrillation and typical atrial flutter Maintaining sinus rhythm on amiodarone.  The patient would like to pursue a strategy for rhythm control but avoid long-term exposure to amiodarone.  I discussed ablation in detail during today's visit including the risk, expected efficacy and recovery time.  She would like to proceed with scheduling.  We will need to repeat her echocardiogram and obtain a CT PE protocol to evaluate her left atrial anatomy.  She understands this.  She will continue to take her Eliquis uninterrupted.  Risk, benefits, and alternatives to EP study and radiofrequency ablation for afib were also discussed in detail today. These risks include but are not limited to stroke, bleeding, vascular damage, tamponade, perforation, damage to the esophagus, lungs, and other structures, pulmonary vein stenosis, worsening renal function, and death. The patient understands these risk and wishes to proceed.  We will therefore proceed with catheter ablation at the next available time.  Carto, ICE, anesthesia are requested for the procedure.  Will also obtain CT PV protocol prior to the procedure to exclude LAA thrombus and further evaluate atrial anatomy.  Ablation strategy will be PVI plus posterior wall plus CTI.  We will plan to continue the amiodarone for at least 3 months following ablation.  If she is doing well for 34-month mark after ablation we will discontinue the amiodarone.  2.  Hypertension Controlled.  Continue current regimen.    ------------------------------------------------------------------------  I have seen, examined the patient, and reviewed the above assessment and plan.    Plan for PVI+CTI today. TEE reviewed.   Vickie Epley, MD 08/06/2020 7:19 AM

## 2020-08-06 NOTE — Progress Notes (Signed)
Pt ambulated without difficulty or bleeding. DC instructions reviewed with patient and spouse.

## 2020-08-06 NOTE — Anesthesia Procedure Notes (Signed)
Procedure Name: Intubation Date/Time: 08/06/2020 8:34 AM Performed by: Harden Mo, CRNA Pre-anesthesia Checklist: Patient identified, Emergency Drugs available, Suction available and Patient being monitored Patient Re-evaluated:Patient Re-evaluated prior to induction Oxygen Delivery Method: Circle System Utilized Preoxygenation: Pre-oxygenation with 100% oxygen Induction Type: IV induction Ventilation: Mask ventilation without difficulty Laryngoscope Size: Miller and 2 Grade View: Grade I Tube type: Oral Tube size: 7.5 mm Number of attempts: 1 Airway Equipment and Method: Stylet and Oral airway Placement Confirmation: ETT inserted through vocal cords under direct vision,  positive ETCO2 and breath sounds checked- equal and bilateral Secured at: 22 cm Tube secured with: Tape Dental Injury: Teeth and Oropharynx as per pre-operative assessment

## 2020-08-08 ENCOUNTER — Encounter (HOSPITAL_COMMUNITY): Payer: Self-pay | Admitting: Cardiology

## 2020-08-10 ENCOUNTER — Encounter (HOSPITAL_COMMUNITY): Payer: Self-pay | Admitting: Cardiology

## 2020-08-10 NOTE — Progress Notes (Signed)
During post-op phone call, pt informed RN that she went to grocery store with husband on Sunday and noticed her HR was in the 150's. Pt returned home and rested and HR returned to normal rate. RN informed pt that this was to be expected with activity post ablation; however, she needs to rest for the next week and not perform strenuous activities.

## 2020-08-16 ENCOUNTER — Other Ambulatory Visit: Payer: Self-pay

## 2020-08-16 ENCOUNTER — Ambulatory Visit: Payer: Medicare Other | Admitting: Gastroenterology

## 2020-08-16 ENCOUNTER — Encounter: Payer: Self-pay | Admitting: Gastroenterology

## 2020-08-16 VITALS — BP 111/69 | HR 79 | Temp 97.2°F | Ht 64.0 in | Wt 141.6 lb

## 2020-08-16 DIAGNOSIS — R110 Nausea: Secondary | ICD-10-CM | POA: Diagnosis not present

## 2020-08-16 DIAGNOSIS — Z79899 Other long term (current) drug therapy: Secondary | ICD-10-CM | POA: Diagnosis not present

## 2020-08-16 DIAGNOSIS — R140 Abdominal distension (gaseous): Secondary | ICD-10-CM | POA: Diagnosis not present

## 2020-08-16 MED ORDER — DICYCLOMINE HCL 20 MG PO TABS: 20.0000 mg | ORAL_TABLET | Freq: Three times a day (TID) | ORAL | 3 refills | Status: DC

## 2020-08-16 NOTE — H&P (Signed)
  Cardiology Admission History and Physical:   Patient ID: [name] MRN: [id number]; DOB: 1961-02-26   Admission date: 08/05/2020  PCP:  Virginia Crews, MD   60 year old female with transesophageal echocardiogram prior to atrial fibrillation ablation.  She is without complaints today.  Physical exam HEENT normal Neck supple Chest clear to auscultation Cardiovascular reveals an irregular rhythm. Abdominal exam benign Extremities show no edema. Neurological exam present and   [facility] Providers Cardiologist:  Kate Sable, MD     CHA2DS2-VASc Score = 2  This indicates a 2.2% annual risk of stroke. The patient's score is based upon: CHF History: No HTN History: Yes Diabetes History: No Stroke History: No Vascular Disease History: No Age Score: 0 Gender Score: 1      For questions or updates, please contact [place] Please consult [email or web address] for contact info under     Signed, Kirk Ruths, MD  08/16/2020 7:28 AM

## 2020-08-16 NOTE — Progress Notes (Signed)
Primary Care Physician: Virginia Crews, MD  Primary Gastroenterologist:  Dr. Lucilla Lame  Chief Complaint  Patient presents with  . Gastroesophageal Reflux    Nausea, vomiting and gas    HPI: [name] is a 60 y.o. female here with report of GERD, nausea, Bloating and gas. The patient had an EGD by me back in 2021 that showed a hiatal hernia with an irregular Z line with biopsies consistent with reflux without any gastrointestinal metaplasia seen.  The patient reports that she has had antireflux surgery and has episodes of nausea with inability to vomit and she has retching with dry heaving.  The patient also reports that she continues to have nausea besides being on Protonix 40 mg twice a day.  She thinks it may be related to her irritable bowel syndrome which at the present time is mostly diarrhea but she has had alternating diarrhea and constipation.  She has been taking Linzess and MiraLAX for her constipation.  She is not taking it now.  The patient also reports that her bloating is likely due to her irritable bowel syndrome.  Past Medical History:  Diagnosis Date  . A-fib ([place]) 2021  . Abnormal EKG    HX OF INVERTED T WAVES ON EKG, PALPITATIONS, CHEST PAINS-CARDIAC WORK UP DID NOT SHOW ANY HEART DISEASE  . AC (acromioclavicular) joint bone spurs   . Acute postoperative pain 01/04/2017  . 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 Anemia    Iron Infusion-8 yrs ago  . Anxiety   . Asthma   . Cephalalgia 08/18/2014  . Cervical disc disease 08/18/2014   Needs neck surgery.   . Chronic headaches   . Chronic, continuous use of opioids   . DDD (degenerative disc disease)    CERVICAL AND LUMBAR-CHRONIC PAIN, RT HIP LABRAL TEAR  . Depression    PT STATES A LOT OF STRESS IN HER LIFE  . Dissociative disorder   . Dizziness 04/22/2013  . Duodenal ulcer with hemorrhage and perforation ([place]) 04/27/2003  . Foot drop, right    FROM BACK SURGERY  . GERD (gastroesophageal reflux disease)   .  H/O arthrodesis 08/18/2014  . Headache(784.0)    AND NECK PAIN--STATES RECENT TEST SHOW CERVICAL DEGENERATION  . History of blood transfusion    s/p back surgery  . History of cardioversion   . History of cervical spinal surgery 01/04/2015  . History of kidney stones   . Hypertension   . Inverted T wave   . Narrowing of intervertebral disc space 08/18/2014   Currently on disability.   . Orthostatic hypotension 04/22/2013  . Pain    CHRONIC NECK AND BACK PAIN - LIMITED ROM NECK - S/P FUSIONS CERVICAL AND LUMBAR  . Pneumonia   . PONV (postoperative nausea and vomiting)    PT GIVES HX OF N&V AND FEVER WITH SURGERIES YEARS AGO--BUT NO PROBLEMS WITH MORE RECENT SURGERIES--STATES NOT MALIGNANT HYPERTHERMIA  . Postop Hyponatremia 05/14/2012  . Postoperative anemia due to acute blood loss 05/14/2012  . PTSD (post-traumatic stress disorder)   . Right foot drop   . 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   . Sleep apnea 2021   does not have a cpap  . Sleep apnea   . Therapeutic opioid-induced constipation (OIC)     Current Outpatient Medications  Medication Sig Dispense Refill  . dicyclomine (BENTYL) 20 MG tablet Take 1 tablet (20 mg total) by mouth 3 (three)  times daily before meals. 90 tablet 3  . acetaminophen (TYLENOL) 500 MG tablet Take 500-1,000 mg by mouth every 6 (six) hours as needed for moderate pain.     Marland Kitchen ALPRAZolam (XANAX) 1 MG tablet Take 1 mg by mouth at bedtime.     Marland Kitchen amiodarone (PACERONE) 200 MG tablet Take 1 tablet (200 mg total) by mouth daily. 30 tablet 0  . amLODipine (NORVASC) 5 MG tablet Take 5 mg by mouth daily.    Marland Kitchen apixaban (ELIQUIS) 5 MG TABS tablet Take 1 tablet (5 mg total) by mouth 2 (two) times daily. 60 tablet 5  . busPIRone (BUSPAR) 15 MG tablet Take 15 mg by mouth 2 (two) times daily.    . busPIRone (BUSPAR) 7.5 MG tablet Take 7.5 mg by mouth 2 (two) times daily.    Marland Kitchen LINZESS 290 MCG CAPS capsule TAKE 1 CAPSULE BY MOUTH  DAILY BEFORE BREAKFAST. (Patient taking differently: Take 290 mcg by mouth daily as needed (Constipation).) 30 capsule 6  . Menthol, Topical Analgesic, (BENGAY EX) Apply 1 application topically daily as needed (pain).    . metoprolol tartrate (LOPRESSOR) 100 MG tablet Take one tablet by mouth 2 hours prior to cardiac CT if your heart rate is greater than 55 beats per minute (Patient not taking: No sig reported) 1 tablet 0  . montelukast (SINGULAIR) 10 MG tablet TAKE 1 TABLET BY MOUTH EVERY DAY (Patient taking differently: Take 10 mg by mouth at bedtime.) 90 tablet 1  . morphine (MSIR) 15 MG tablet Take 1 tablet (15 mg total) by mouth every 6 (six) hours as needed for moderate pain or severe pain. Must last 30 days. (Patient taking differently: Take 15 mg by mouth every 6 (six) hours. Must last 30 days.) 120 tablet 0  . morphine (MSIR) 15 MG tablet Take 1 tablet (15 mg total) by mouth every 6 (six) hours as needed for moderate pain or severe pain. Must last 30 days. 120 tablet 0  . naphazoline-pheniramine (VISINE) 0.025-0.3 % ophthalmic solution 1 drop 4 (four) times daily as needed for eye irritation.    . ondansetron (ZOFRAN ODT) 4 MG disintegrating tablet Take 1 tablet (4 mg total) by mouth every 8 (eight) hours as needed for nausea or vomiting. 20 tablet 0  . pantoprazole (PROTONIX) 40 MG tablet TAKE 1 TABLET BY MOUTH TWICE A DAY (Patient taking differently: Take 40 mg by mouth 2 (two) times daily.) 180 tablet 3  . polyethylene glycol (MIRALAX / GLYCOLAX) 17 g packet Take 17 g by mouth daily.    . sodium chloride (OCEAN) 0.65 % SOLN nasal spray Place 1 spray into both nostrils daily as needed for congestion (Dryness).    . valACYclovir (VALTREX) 1000 MG tablet TAKE TWO TABLETS BY MOUTH TWICE DAILY FOR ONE DAY FEVER BLISTER (Patient taking differently: Take 1,000 mg by mouth daily as needed (Cold sore).) 4 tablet 5   No current facility-administered medications for this visit.    Allergies as of  08/16/2020 - Review Complete 08/16/2020  Allergen Reaction Noted  . Amoxicillin Hives 08/20/2014  . Chlorhexidine gluconate Dermatitis and Hives 07/06/2016  . Clindamycin Hives 08/20/2014  . Codeine Hives 08/21/2011  . Erythromycin Hives 07/06/2016  . Penicillin g Hives 07/06/2016  . Sulfa antibiotics Nausea And Vomiting and Hives 09/06/2011  . Levofloxacin Hives 02/18/2018  . Shellfish allergy Hives 10/08/2012  . Decadron [dexamethasone] Other (See Comments) 09/20/2011  . Mangifera indica Hives 07/20/2016  . Papaya derivatives Hives 10/30/2012  . Betadine [  povidone iodine] Hives 09/06/2011  . Clarithromycin Hives 09/08/2011  . Other Hives 09/06/2011  . Povidone-iodine Hives 09/06/2011  . Prednisone Anxiety 08/18/2014    ROS:  General: Negative for anorexia, weight loss, fever, chills, fatigue, weakness. ENT: Negative for hoarseness, difficulty swallowing , nasal congestion. CV: Negative for chest pain, angina, palpitations, dyspnea on exertion, peripheral edema.  Respiratory: Negative for dyspnea at rest, dyspnea on exertion, cough, sputum, wheezing.  GI: See history of present illness. GU:  Negative for dysuria, hematuria, urinary incontinence, urinary frequency, nocturnal urination.  Endo: Negative for unusual weight change.    Physical Examination:   BP 111/69   Pulse 79   Temp (!) 97.2 F (36.2 C) (Temporal)   Ht 5\' 4"  (1.626 m)   Wt 141 lb 9.6 oz (64.2 kg)   BMI 24.31 kg/m   General: Well-nourished, well-developed in no acute distress.  Eyes: No icterus. Conjunctivae pink. Lungs: Clear to auscultation bilaterally. Non-labored. Heart: Regular rate and rhythm, no murmurs rubs or gallops.  Abdomen: Bowel sounds are normal, nontender, nondistended, no hepatosplenomegaly or masses, no abdominal bruits or hernia , no rebound or guarding.   Extremities: No lower extremity edema. No clubbing or deformities. Neuro: Alert and oriented x 3.  Grossly intact. Skin: Warm and  dry, no jaundice.   Psych: Alert and cooperative, normal mood and affect.  Labs:    Imaging Studies: ECHOCARDIOGRAM COMPLETE  Result Date: 07/22/2020    ECHOCARDIOGRAM REPORT   Patient Name:   [name] Date of Exam: 07/22/2020 Medical Rec #:  [id number]        Height:       64.0 in Accession #:    [id number]       Weight:       142.0 lb Date of Birth:  [birth year]        BSA:          1.691 m Patient Age:    1 years         BP:           120/74 mmHg Patient Gender: F                HR:           56 bpm. Exam Location:  [facility] Procedure: 2D Echo, Cardiac Doppler and Color Doppler Indications:    I48.91* Unspeicified atrial fibrillation  History:        Patient has prior history of Echocardiogram examinations, most                 recent 07/09/2019. Arrythmias:Atrial Fibrillation; Risk                 Factors:Hypertension, Dyslipidemia, Non-Smoker and Sleep Apnea.  Sonographer:    Pilar Jarvis RDMS, RVT, RDCS Referring Phys: [id number] [place] LAMBERT IMPRESSIONS  1. Left ventricular ejection fraction, by estimation, is 60 to 65%. The left ventricle has normal function. The left ventricle has no regional wall motion abnormalities. Left ventricular diastolic parameters are indeterminate.  2. Right ventricular systolic function is normal. The right ventricular size is normal. There is mildly elevated pulmonary artery systolic pressure. The estimated right ventricular systolic pressure is 30.8 mmHg.  3. Left atrial size was mild to moderately dilated.  4. Unable to exclude mild mitral valve prolapse of the posterior leaflet. Moderate mitral valve regurgitation. No evidence of mitral stenosis. FINDINGS  Left Ventricle: Left ventricular ejection fraction, by estimation, is 60 to 65%. The left ventricle has normal  function. The left ventricle has no regional wall motion abnormalities. The left ventricular internal cavity size was normal in size. There is  no left ventricular hypertrophy. Left ventricular  diastolic parameters are indeterminate. Right Ventricle: The right ventricular size is normal. No increase in right ventricular wall thickness. Right ventricular systolic function is normal. There is mildly elevated pulmonary artery systolic pressure. The tricuspid regurgitant velocity is 2.93  m/s, and with an assumed right atrial pressure of 5 mmHg, the estimated right ventricular systolic pressure is 22.6 mmHg. Left Atrium: Left atrial size was mild to moderately dilated. Right Atrium: Right atrial size was normal in size. Pericardium: There is no evidence of pericardial effusion. Mitral Valve: The mitral valve is normal in structure. Moderate mitral valve regurgitation. No evidence of mitral valve stenosis. Tricuspid Valve: The tricuspid valve is normal in structure. Tricuspid valve regurgitation is mild . No evidence of tricuspid stenosis. Aortic Valve: The aortic valve is normal in structure. Aortic valve regurgitation is trivial. No aortic stenosis is present. Aortic valve mean gradient measures 3.0 mmHg. Aortic valve peak gradient measures 5.4 mmHg. Aortic valve area, by VTI measures 2.45 cm. Pulmonic Valve: The pulmonic valve was normal in structure. Pulmonic valve regurgitation is not visualized. No evidence of pulmonic stenosis. Aorta: The aortic root is normal in size and structure. Venous: The inferior vena cava is normal in size with greater than 50% respiratory variability, suggesting right atrial pressure of 3 mmHg. IAS/Shunts: No atrial level shunt detected by color flow Doppler.  LEFT VENTRICLE PLAX 2D LVIDd:         4.10 cm     Diastology LVIDs:         2.60 cm     LV e' medial:    5.22 cm/s LV PW:         0.90 cm     LV E/e' medial:  15.8 LV IVS:        1.20 cm     LV e' lateral:   6.85 cm/s LVOT diam:     1.80 cm     LV E/e' lateral: 12.0 LV SV:         61 LV SV Index:   36 LVOT Area:     2.54 cm  LV Volumes (MOD) LV vol d, MOD A2C: 62.3 ml LV vol d, MOD A4C: 74.7 ml LV vol s, MOD A2C: 32.7 ml  LV vol s, MOD A4C: 31.6 ml LV SV MOD A2C:     29.6 ml LV SV MOD A4C:     74.7 ml LV SV MOD BP:      38.2 ml RIGHT VENTRICLE            IVC RV S prime:     9.90 cm/s  IVC diam: 1.20 cm TAPSE (M-mode): 2.1 cm LEFT ATRIUM             Index LA diam:        4.30 cm 2.54 cm/m LA Vol (A2C):   81.3 ml 48.07 ml/m LA Vol (A4C):   35.5 ml 20.99 ml/m LA Biplane Vol: 57.3 ml 33.88 ml/m  AORTIC VALVE                   PULMONIC VALVE AV Area (Vmax):    2.16 cm    PV Vmax:       0.52 m/s AV Area (Vmean):   2.32 cm    PV Peak grad:  1.1 mmHg AV Area (VTI):  2.45 cm AV Vmax:           116.00 cm/s AV Vmean:          76.100 cm/s AV VTI:            0.248 m AV Peak Grad:      5.4 mmHg AV Mean Grad:      3.0 mmHg LVOT Vmax:         98.60 cm/s LVOT Vmean:        69.500 cm/s LVOT VTI:          0.239 m LVOT/AV VTI ratio: 0.96  AORTA Ao Root diam: 3.20 cm Ao Arch diam: 2.9 cm MITRAL VALVE               TRICUSPID VALVE MV Area (PHT): 3.91 cm    TR Peak grad:   34.3 mmHg MV Decel Time: 194 msec    TR Vmax:        293.00 cm/s MV E velocity: 82.30 cm/s MV A velocity: 38.10 cm/s  SHUNTS MV E/A ratio:  2.16        Systemic VTI:  0.24 m                            Systemic Diam: 1.80 cm Ida Rogue MD Electronically signed by Ida Rogue MD Signature Date/Time: 07/22/2020/6:28:18 PM    Final    ECHO TEE  Result Date: 08/05/2020    TRANSESOPHOGEAL ECHO REPORT   Patient Name:   [name] Date of Exam: 08/05/2020 Medical Rec #:  [id number]        Height:       64.0 in Accession #:    [id number]       Weight:       142.0 lb Date of Birth:  19-Dec-1960        BSA:          1.691 m Patient Age:    49 years         BP:           129/109 mmHg Patient Gender: F                HR:           65 bpm. Exam Location:  [facility] Procedure: Transesophageal Echo, 3D Echo, Cardiac Doppler and Color Doppler Indications:     I48.91* Unspeicified atrial fibrillation  History:         Patient has prior history of Echocardiogram examinations, most                   recent 07/22/2020. Abnormal ECG, Arrythmias:Atrial Fibrillation                  and Atrial Flutter, Signs/Symptoms:Dizziness/Lightheadedness,                  Shortness of Breath and Dyspnea; Risk Factors:Hypertension,                  Dyslipidemia and Sleep Apnea.  Sonographer:     Roseanna Rainbow RDCS Referring Phys:  [place] Diagnosing Phys: Kirk Ruths MD PROCEDURE: After discussion of the risks and benefits of a TEE, an informed consent was obtained from the patient. The transesophogeal probe was passed without difficulty through the esophogus of the patient. Imaged were obtained with the patient in a left lateral decubitus position. Local oropharyngeal anesthetic was provided with  Cetacaine. Sedation performed by different physician. The patient was monitored while under deep sedation. Anesthestetic sedation was provided intravenously by Anesthesiology: 230mg  of Propofol. The patient developed no complications during the procedure. IMPRESSIONS  1. 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  2. Left ventricular ejection fraction, by estimation, is 55 to 60%. The left ventricle has normal function.  3. Right ventricular systolic function is normal. The right ventricular size is normal.  4. Left atrial size was mildly dilated. No left atrial/left atrial appendage thrombus was detected.  5. The mitral valve is normal in structure. Mild mitral valve regurgitation.  6. The aortic valve is tricuspid. Aortic valve regurgitation is trivial.  7. There is mild (Grade II) plaque involving the descending aorta. FINDINGS  Left Ventricle: Left ventricular ejection fraction, by estimation, is 55 to 60%. The left ventricle has normal function. The left ventricular internal cavity size was normal in size. Right Ventricle: The right ventricular size is normal.Right ventricular systolic function is normal. Left Atrium: Left atrial size was mildly dilated. No left atrial/left  atrial appendage thrombus was detected. Right Atrium: Right atrial size was normal in size. Pericardium: There is no evidence of pericardial effusion. Mitral Valve: The mitral valve is normal in structure. Mild mitral valve regurgitation. Tricuspid Valve: The tricuspid valve is normal in structure. Tricuspid valve regurgitation is mild. Aortic Valve: The aortic valve is tricuspid. Aortic valve regurgitation is trivial. Pulmonic Valve: The pulmonic valve was normal in structure. Pulmonic valve regurgitation is trivial. Aorta: The aortic root is normal in size and structure. There is mild (Grade II) plaque involving the descending aorta. IAS/Shunts: No atrial level shunt detected by color flow Doppler. 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  MR PISA:        1.57 cm MR PISA Radius: 0.50 cm Kirk Ruths MD Electronically signed by Kirk Ruths MD Signature Date/Time: 08/05/2020/1:22:55 PM    Final     Assessment and Plan:   [name] is a 60 y.o. y/o female who comes in today with a history of irritable bowel syndrome with nausea wall taking Protonix 40 mg twice a day. The patient has been under some stress recently with an irregular heartbeat at cardiac ablation.  She also reports that she does not have a gallbladder.  She has been explained that the irritable bowel syndrome can be causing her nausea and since she has never been tried on a trial of dicyclomine she will be started on that.  She has also been told to add Citrucel daily to help regulate her bowel movements.  The patient has been explained the plan and agrees with it.     Lucilla Lame, MD. [facility]al    Note: This dictation was prepared with Dragon dictation along with smaller phrase technology. Any transcriptional errors that result from this process are unintentional.

## 2020-08-17 NOTE — Progress Notes (Addendum)
PROVIDER NOTE: Information contained herein reflects review and annotations entered in association with encounter. Interpretation of such information and data should be left to medically-trained personnel. Information provided to patient can be located elsewhere in the medical record under "Patient Instructions". Document created using STT-dictation technology, any transcriptional errors that may result from process are unintentional.    Patient: [name]  Service Category: E/M  Provider: Gaspar Cola, MD  DOB: 07-09-1960  DOS: 08/18/2020  Specialty: Interventional Pain Management  MRN: [id number]  Setting: Ambulatory outpatient  PCP: Virginia Crews, MD  Type: Established Patient    Referring Provider: Virginia Crews, MD  Location: Office  Delivery: Face-to-face     HPI  [name], a 60 y.o. year old female, is here today because of her Chronic upper back pain [M54.9, G89.29]. [name] primary complain today is Back Pain (Upper back between shoulder blades) Last encounter: My last encounter with her was on 05/19/2020. Pertinent problems: 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); Chronic hip pain (2ry area of Pain) (Bilateral) (L>R); Chronic shoulder arthropathy (Left); Trigger  point of shoulder region (Left); Enthesopathy of hip region (Left); Groin pain, chronic, left; Other intervertebral disc degeneration, lumbar region; History of lumbar surgery; DDD (degenerative disc disease), lumbosacral; Neuropathic pain; Neurogenic pain; Spondylosis without myelopathy or radiculopathy, lumbosacral region; Chronic musculoskeletal pain; Cervical radiculitis (C4,C5,C8) (Left); DDD (degenerative disc disease), cervical; Foraminal stenosis of cervical region (C3-4) (Right); 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 Pain Assessment: Severity of Chronic pain is reported as a 2 /10. Location: Back Upper/radiates to shoulders and neck, causing headaches. Onset: More than a month ago. Quality: Numbness,Tingling,Stabbing,Aching (hot). Timing: Intermittent. Modifying factor(s): massage, heating pad. Vitals:  height is 5' 4"  (1.626 m) and weight is 140 lb (63.5 kg). Her temperature is 97.2 F (36.2 C) (abnormal). Her blood pressure is 144/90 (abnormal) and her pulse is 74. Her respiration is 18 and oxygen saturation is 100%.   Reason for encounter: medication management.   The patient indicates doing well with the current medication regimen. No adverse reactions or side effects reported to the medications.   The patient indicates still having pain in the upper back area which seems to radiate towards the neck, both shoulders, and occasionally into the  anterior chest area.  She has a history of extensive back surgery with hardware.  She also indicates being unable to tolerate steroids due to getting side effects such as steroid flushing and severe mood disturbances.  She indicated having had her last  MRI done at [facility] imaging, but we do not have those results and we have attempted to contact them today, unsuccessfully.  We will continue to try to see if we can get those images, but today we will let the patient go since we do not have any further information to go on.  Physical exam today demonstrated tenderness to palpation over the spinous processes at about the T4/T5 level with some paraspinal muscle tenderness.  This may be facet mediated however, it could also be discomfort from her fusion hardware.  RTCB: 11/17/2020  Pharmacotherapy Assessment   Analgesic: Morphine IR 15 mg 1 tab p.o. every 6 hours (60 mg/day of morphine) MME/day: 60 mg/day.   Monitoring: [place] PMP: PDMP reviewed during this encounter.       Pharmacotherapy: No side-effects or adverse reactions reported. Compliance: No problems identified. Effectiveness: Clinically acceptable.  Dewayne Shorter, RN  08/18/2020  1:02 PM  Sign when Signing Visit Nursing Pain Medication Assessment:  Safety precautions to be maintained throughout the outpatient stay will include: orient to surroundings, keep bed in low position, maintain call bell within reach at all times, provide assistance with transfer out of bed and ambulation.  Medication Inspection Compliance: Pill count conducted under aseptic conditions, in front of the patient. Neither the pills nor the bottle was removed from the patient's sight at any time. Once count was completed pills were immediately returned to the patient in their original bottle.  Medication: Morphine ER (MSContin) Pill/Patch Count: 26 of 120 pills remain Pill/Patch Appearance: Markings consistent with prescribed medication Bottle Appearance: Standard  pharmacy container. Clearly labeled. Filled Date: 05 / 13/ 2022 Last Medication intake:  Today    UDS:  Summary  Date Value Ref Range Status  08/20/2019 Note  Final    Comment:    ==================================================================== ToxASSURE Select 13 (MW) ==================================================================== Test                             Result       Flag       Units  Drug Present and Declared for Prescription Verification   Alprazolam                     132          EXPECTED   ng/mg creat   Alpha-hydroxyalprazolam        293          EXPECTED   ng/mg creat    Source of alprazolam is a scheduled prescription medication. Alpha-    hydroxyalprazolam is an expected metabolite of alprazolam.    Oxycodone                      2443         EXPECTED   ng/mg creat   Oxymorphone                    922          EXPECTED   ng/mg creat   Noroxycodone                   >9434        EXPECTED   ng/mg creat   Noroxymorphone                 225          EXPECTED   ng/mg creat  Sources of oxycodone are scheduled prescription medications.    Oxymorphone, noroxycodone, and noroxymorphone are expected    metabolites of oxycodone. Oxymorphone is also available as a    scheduled prescription medication.  ==================================================================== Test                      Result    Flag   Units      Ref Range   Creatinine              106              mg/dL      >=20 ==================================================================== Declared Medications:  The flagging and interpretation on this report are based on the  following declared medications.  Unexpected results may arise from  inaccuracies in the declared medications.   **Note: The testing scope of this panel includes these medications:   Alprazolam (Xanax)  Oxycodone   **Note: The testing scope of this panel does not include the  following reported medications:    Acetaminophen (Tylenol)  Albuterol (Ventolin HFA)  Apixaban (Eliquis)  Calcium (Tums)  Cyclobenzaprine (Flexeril)  Fluticasone (Advair)  Furosemide (Lasix)  Lubiprostone (Amitiza)  Metoprolol (Toprol)  Omeprazole (Prilosec)  Ondansetron (Zofran)  Pantoprazole (Protonix)  Salmeterol (Advair)  Valacyclovir (Valtrex) ==================================================================== For clinical consultation, please call [phone]. ====================================================================      ROS  Constitutional: Denies any fever or chills Gastrointestinal: No reported hemesis, hematochezia, vomiting, or acute GI distress Musculoskeletal: Denies any acute onset joint swelling, redness, loss of ROM, or weakness Neurological: No reported episodes of acute onset apraxia, aphasia, dysarthria, agnosia, amnesia, paralysis, loss of coordination, or loss of consciousness  Medication Review  ALPRAZolam, Menthol (Topical Analgesic), acetaminophen, amiodarone, apixaban, busPIRone, dicyclomine, linaclotide, montelukast, morphine, naphazoline-pheniramine, ondansetron, pantoprazole, polyethylene glycol, sodium chloride, and valACYclovir  History Review  Allergy: Ms. Fana is allergic to amoxicillin, chlorhexidine gluconate, clindamycin, codeine, erythromycin, penicillin g, sulfa antibiotics, levofloxacin, shellfish allergy, decadron [dexamethasone], mangifera indica, papaya derivatives, betadine [povidone iodine], clarithromycin, other, povidone-iodine, and prednisone. Drug: Ms. Hagwood  reports current drug use. Alcohol:  reports no history of alcohol use. Tobacco:  reports that she has never smoked. She has never used smokeless tobacco. Social: Ms. Shiffman  reports that she has never smoked. She has never used smokeless tobacco. She reports current drug use. She reports that she does not drink alcohol. 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), Anemia, Anxiety, Asthma, Cephalalgia (08/18/2014), Cervical disc disease (08/18/2014), Chronic headaches, Chronic, continuous use of opioids, DDD (degenerative disc disease), Depression, Dissociative disorder, Dizziness (04/22/2013), Duodenal ulcer with hemorrhage and perforation ([place]) (04/27/2003), Foot drop, right, GERD (gastroesophageal reflux disease), H/O arthrodesis (08/18/2014), Headache(784.0), History of blood transfusion, History of cardioversion, History of cervical spinal surgery (01/04/2015), History of kidney stones, Hypertension, Inverted T wave, Narrowing of intervertebral disc space (08/18/2014), Orthostatic hypotension (04/22/2013), Pain, Pneumonia, PONV (postoperative nausea and vomiting), Postop Hyponatremia (05/14/2012), Postoperative anemia due to acute blood loss (05/14/2012), PTSD (post-traumatic stress disorder), Right foot drop, Right hip arthralgia (08/18/2014), Sleep apnea (2021), Sleep apnea, and Therapeutic opioid-induced constipation ([place]). Surgical: Ms. Avalos  has a past surgical history that includes RIGHT HIP ARTHROSCOPY FOR LABRAL TEAR (ABOUT 2010); Anterior fusion cervical spine (MAY 2012); Nasal septum surgery (MARCH 2013); Back surgery (2009); Tonsillectomy; Abdominal hysterectomy; DIAGNOSTIC LAPAROSCOPIES - MULTIPLE FOR ENDOMETRIOSIS; Cholecystectomy; Hip arthroscopy (09/20/2011); Shoulder arthroscopy (05-06-12); Carpal tunnel release (05-06-12); Total  hip arthroplasty (Right, 05/13/2012); Anterior cervical decomp/discectomy fusion (N/A, 10/30/2012); Posterior cervical fusion/foraminotomy (N/A, 04/16/2013); Breast reduction surgery (Bilateral, 06/2016); Colonoscopy with propofol (N/A, 01/26/2017); Esophagogastroduodenoscopy (egd) with propofol (N/A, 01/26/2017); Esophageal dilation (N/A, 01/26/2017); polypectomy (N/A, 01/26/2017); PH impedance study (N/A, 05/30/2017); Esophageal manometry (N/A,  05/30/2017); Radiofrequency ablation nerves; afib (05/2019); LEFT HEART CATH AND CORONARY ANGIOGRAPHY (Left, 06/10/2019); Cardioversion (N/A, 12/03/2019); Esophagogastroduodenoscopy (egd) with propofol (N/A, 01/30/2020); Reduction mammaplasty (Bilateral, 05/2016); Breast biopsy (Right, 2008); Breast excisional biopsy (Left, 1998); TEE without cardioversion (N/A, 08/05/2020); ATRIAL FIBRILLATION ABLATION (N/A, 08/06/2020); and Cardiac electrophysiology mapping and ablation. Family: family history includes Aneurysm in her maternal grandmother and mother; Anxiety disorder in her grandchild; Bipolar disorder in her grandchild and sister; Breast cancer (age of onset: 44) in her paternal grandmother; Depression in her grandchild.  Laboratory Chemistry Profile   Renal Lab Results  Component Value Date   BUN 9 07/22/2020   CREATININE 0.74 07/22/2020   BCR 7 (L) 11/11/2018   GFRAA >60 11/26/2019   GFRNONAA >60 07/22/2020     Hepatic Lab Results  Component Value Date   AST 20 05/01/2020   ALT 13 05/01/2020   ALBUMIN 4.5 05/01/2020   ALKPHOS 98 05/01/2020   LIPASE 28 05/01/2020     Electrolytes Lab Results  Component Value Date   NA 137 07/22/2020   K 3.7 07/22/2020   CL 105 07/22/2020   CALCIUM 8.9 07/22/2020   MG 1.9 11/11/2018     Bone Lab Results  Component Value Date   25OHVITD1 23 (L) 11/11/2018   25OHVITD2 9.8 11/11/2018   25OHVITD3 13 11/11/2018     Inflammation (CRP: Acute Phase) (ESR: Chronic Phase) Lab Results  Component Value Date   CRP 12 (H) 11/11/2018   ESRSEDRATE 42 (H) 11/11/2018   LATICACIDVEN 1.2 05/01/2020       Note: Above Lab results reviewed.  Recent Imaging Review  ECHO TEE    TRANSESOPHOGEAL ECHO REPORT       Patient Name:   [name] Date of Exam: 08/05/2020 Medical Rec #:  [id number]        Height:       64.0 in Accession #:    [id number]       Weight:       142.0 lb Date of Birth:  04-27-1960        BSA:          1.691 m Patient Age:    72  years         BP:           129/109 mmHg Patient Gender: F                HR:           65 bpm. Exam Location:  Outpatient  Procedure: Transesophageal Echo, 3D Echo, Cardiac Doppler and Color Doppler  Indications:     I48.91* Unspeicified atrial fibrillation   History:         Patient has prior history of Echocardiogram examinations, most                  recent 07/22/2020. Abnormal ECG, Arrythmias:Atrial Fibrillation                  and Atrial Flutter, Signs/Symptoms:Dizziness/Lightheadedness,                  Shortness of Breath and Dyspnea; Risk Factors:Hypertension,  Dyslipidemia and Sleep Apnea.   Sonographer:     Roseanna Rainbow RDCS Referring Phys:  [place] Diagnosing Phys: Kirk Ruths MD  PROCEDURE: After discussion of the risks and benefits of a TEE, an informed consent was obtained from the patient. The transesophogeal probe was passed without difficulty through the esophogus of the patient. Imaged were obtained with the patient in a  left lateral decubitus position. Local oropharyngeal anesthetic was provided with Cetacaine. Sedation performed by different physician. The patient was monitored while under deep sedation. Anesthestetic sedation was provided intravenously by  Anesthesiology: 280m of Propofol. The patient developed no complications during the procedure.  IMPRESSIONS   1. 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  2. Left ventricular ejection fraction, by estimation, is 55 to 60%. The left ventricle has normal function.  3. Right ventricular systolic function is normal. The right ventricular size is normal.  4. Left atrial size was mildly dilated. No left atrial/left atrial appendage thrombus was detected.  5. The mitral valve is normal in structure. Mild mitral valve regurgitation.  6. The aortic valve is tricuspid. Aortic valve regurgitation is trivial.  7. There is mild (Grade II) plaque involving the  descending aorta.  FINDINGS  Left Ventricle: Left ventricular ejection fraction, by estimation, is 55 to 60%. The left ventricle has normal function. The left ventricular internal cavity size was normal in size.  Right Ventricle: The right ventricular size is normal.Right ventricular systolic function is normal.  Left Atrium: Left atrial size was mildly dilated. No left atrial/left atrial appendage thrombus was detected.  Right Atrium: Right atrial size was normal in size.  Pericardium: There is no evidence of pericardial effusion.  Mitral Valve: The mitral valve is normal in structure. Mild mitral valve regurgitation.  Tricuspid Valve: The tricuspid valve is normal in structure. Tricuspid valve regurgitation is mild.  Aortic Valve: The aortic valve is tricuspid. Aortic valve regurgitation is trivial.  Pulmonic Valve: The pulmonic valve was normal in structure. Pulmonic valve regurgitation is trivial.  Aorta: The aortic root is normal in size and structure. There is mild (Grade II) plaque involving the descending aorta.  IAS/Shunts: No atrial level shunt detected by color flow Doppler.  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    MR PISA:        1.57 cm MR PISA Radius: 0.50 cm  BKirk RuthsMD Electronically signed by BKirk RuthsMD Signature Date/Time: 08/05/2020/1:22:55 PM      Final   Note: Reviewed        Physical Exam  General appearance: Well nourished, well developed, and well hydrated. In no apparent acute distress Mental status: Alert, oriented x 3 (person, place, & time)       Respiratory: No evidence of acute respiratory distress Eyes: PERLA Vitals: BP (!) 144/90   Pulse 74   Temp (!) 97.2 F (36.2 C)   Resp 18   Ht 5' 4"  (1.626 m)   Wt 140 lb (63.5 kg)   SpO2 100%   BMI 24.03 kg/m  BMI: Estimated body mass index is 24.03 kg/m as calculated from the following:   Height as of this encounter: 5'  4" (1.626 m).   Weight as of this encounter: 140 lb (63.5 kg). Ideal: Ideal body weight: 54.7 kg (120 lb 9.5 oz) Adjusted ideal body weight: 58.2 kg (128 lb 5.7 oz)  Assessment   Status Diagnosis  Controlled Controlled Controlled 1. Chronic upper back pain  2. Chronic low back pain (1ry area of Pain) (Bilateral) (L>R) w/o sciatica   3. Chronic hip pain (2ry area of Pain) (Bilateral) (L>R)   4. Chronic shoulder pain (3ry area of Pain) (Bilateral) (L>R)   5. DDD (degenerative disc disease), cervical   6. DDD (degenerative disc disease), lumbosacral   7. 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 History of cervical spinal surgery   9. History of lumbar surgery   10. Chronic pain syndrome   11. Pharmacologic therapy   12. Chronic use of opiate for therapeutic purpose      Updated Problems: Problem  Chronic Use of Opiate for Therapeutic Purpose  Persistent Atrial Fibrillation (H[facility])  Gastritis Without Bleeding  Typical Atrial Flutter ([facility])  Unstable Angina ([facility])  S/P Repair of Paraesophageal Hernia  Heartburn  Pure Hypercholesterolemia  Snoring    Plan of Care  Problem-specific:  No problem-specific Assessment & Plan notes found for this encounter.  [name] has a current medication list which includes the following long-term medication(s): amiodarone, apixaban, dicyclomine, linzess, montelukast, [START ON 08/19/2020] morphine, [START ON 09/18/2020] morphine, [START ON 10/18/2020] morphine, and pantoprazole.  Pharmacotherapy (Medications Ordered): Meds ordered this encounter  Medications  . morphine (MSIR) 15 MG tablet    Sig: Take 1 tablet (15 mg total) by mouth every 6 (six) hours as needed for moderate pain or severe pain. Must last 30 days.    Dispense:  120 tablet    Refill:  0    Not a duplicate. Do NOT delete! Dispense 1 day early if closed on refill date. Avoid benzodiazepines within 8 hours of opioids. Do not send refill requests.  Marland Kitchen  morphine (MSIR) 15 MG tablet    Sig: Take 1 tablet (15 mg total) by mouth every 6 (six) hours as needed for moderate pain or severe pain. Must last 30 days.    Dispense:  120 tablet    Refill:  0    Not a duplicate. Do NOT delete! Dispense 1 day early if closed on refill date. Avoid benzodiazepines within 8 hours of opioids. Do not send refill requests.  Marland Kitchen morphine (MSIR) 15 MG tablet    Sig: Take 1 tablet (15 mg total) by mouth every 6 (six) hours as needed for moderate pain or severe pain. Must last 30 days.    Dispense:  120 tablet    Refill:  0    Not a duplicate. Do NOT delete! Dispense 1 day early if closed on refill date. Avoid benzodiazepines within 8 hours of opioids. Do not send refill requests.   Orders:  Orders Placed This Encounter  Procedures  . ToxASSURE Select 13 (MW), Urine    Volume: 30 ml(s). Minimum 3 ml of urine is needed. Document temperature of fresh sample. Indications: Long term (current) use of opiate analgesic (Z66.063)    Order Specific Question:   Release to patient    Answer:   Immediate   Follow-up plan:   Return in about 13 weeks (around 11/17/2020) for evaluation day (F2F) (MM).      Interventional Therapies  Risk  Complexity Considerations:   1. SHELLFISH ALLERGY; Dexamethasone & Prednisone allergy.  (NO STEROIDS)  2. NOT a candidate for lumbar facet RFA (due to hardware). 3. NOT a candidate for membrane stabilizers (Neurontin/Lyrica) due to prior failed trials. 4. NOT a candidate for SCS (due to HARDWARE). 5. NOT a candidate for IT Pump (patient's choice due to surgical risks).  Planned  Pending:   Pending further evaluation   Under consideration:   Diagnostic cervico-thoracic facet blocks    Completed:   Diagnostic bilateral lumbar facet block x2 (01/14/2019) Palliativebilateral suprascapular NB x2(No Steroids) (06/19/2016) Palliative leftsuprascapular nerve RFA x1(01/04/2017)   Therapeutic  Palliative (PRN) options:   Diagnostic  bilateral lumbar facet block #2  Palliativebilateral suprascapular NB #3(No Steroids) Palliative leftsuprascapular nerve RFA #2       Recent Visits Date Type Provider Dept  06/24/20 Telemedicine Milinda Pointer, MD [facility]  Showing recent visits within past 90 days and meeting all other requirements Today's Visits Date Type Provider Dept  08/18/20 Office Visit Milinda Pointer, MD [facility]  Showing today's visits and meeting all other requirements Future Appointments No visits were found meeting these conditions. Showing future appointments within next 90 days and meeting all other requirements  I discussed the assessment and treatment plan with the patient. The patient was provided an opportunity to ask questions and all were answered. The patient agreed with the plan and demonstrated an understanding of the instructions.  Patient advised to call back or seek an in-person evaluation if the symptoms or condition worsens.  Duration of encounter: 30 minutes.  Note by: Gaspar Cola, MD Date: 08/18/2020; Time: 1:30 PM

## 2020-08-18 ENCOUNTER — Ambulatory Visit: Payer: Medicare Other | Attending: Pain Medicine | Admitting: Pain Medicine

## 2020-08-18 ENCOUNTER — Other Ambulatory Visit: Payer: Self-pay

## 2020-08-18 ENCOUNTER — Encounter: Payer: Self-pay | Admitting: Pain Medicine

## 2020-08-18 ENCOUNTER — Telehealth: Payer: Self-pay | Admitting: Cardiology

## 2020-08-18 VITALS — BP 144/90 | HR 74 | Temp 97.2°F | Resp 18 | Ht 64.0 in | Wt 140.0 lb

## 2020-08-18 DIAGNOSIS — Z79891 Long term (current) use of opiate analgesic: Secondary | ICD-10-CM | POA: Insufficient documentation

## 2020-08-18 DIAGNOSIS — M549 Dorsalgia, unspecified: Secondary | ICD-10-CM | POA: Insufficient documentation

## 2020-08-18 DIAGNOSIS — Z9889 Other specified postprocedural states: Secondary | ICD-10-CM | POA: Insufficient documentation

## 2020-08-18 DIAGNOSIS — M961 Postlaminectomy syndrome, not elsewhere classified: Secondary | ICD-10-CM | POA: Insufficient documentation

## 2020-08-18 DIAGNOSIS — M25552 Pain in left hip: Secondary | ICD-10-CM | POA: Insufficient documentation

## 2020-08-18 DIAGNOSIS — G894 Chronic pain syndrome: Secondary | ICD-10-CM | POA: Diagnosis not present

## 2020-08-18 DIAGNOSIS — Z79899 Other long term (current) drug therapy: Secondary | ICD-10-CM | POA: Diagnosis not present

## 2020-08-18 DIAGNOSIS — M25511 Pain in right shoulder: Secondary | ICD-10-CM | POA: Insufficient documentation

## 2020-08-18 DIAGNOSIS — M25512 Pain in left shoulder: Secondary | ICD-10-CM | POA: Diagnosis not present

## 2020-08-18 DIAGNOSIS — M5030 Other cervical disc degeneration, unspecified cervical region: Secondary | ICD-10-CM | POA: Diagnosis not present

## 2020-08-18 DIAGNOSIS — M25551 Pain in right hip: Secondary | ICD-10-CM | POA: Diagnosis not present

## 2020-08-18 DIAGNOSIS — M545 Low back pain, unspecified: Secondary | ICD-10-CM | POA: Insufficient documentation

## 2020-08-18 DIAGNOSIS — M5137 Other intervertebral disc degeneration, lumbosacral region: Secondary | ICD-10-CM | POA: Diagnosis not present

## 2020-08-18 DIAGNOSIS — G8929 Other chronic pain: Secondary | ICD-10-CM | POA: Diagnosis not present

## 2020-08-18 MED ORDER — MORPHINE SULFATE 15 MG PO TABS: 15.0000 mg | ORAL_TABLET | Freq: Four times a day (QID) | ORAL | 0 refills | Status: DC | PRN

## 2020-08-18 NOTE — Progress Notes (Addendum)
After a while, we were able to obtain the MRI from [facility] imaging but it turned out to be a cervical MRI and not a thoracic one.  The MRI did not contribute towards getting an answer as to what is causing some of the pain in that region except for the fact that it does describe facet degeneration at the cervical thoracic junction.

## 2020-08-18 NOTE — Telephone Encounter (Signed)
Spoke with Pt.  She is having chest burning s/p ablation on 08/06/2020.  Discussed with Dr. Quentin Ore.  He advises ibuprofen 600 mg PO BID for 3 days.  Advised Pt of above.  Advised Pt ok to take Bentyl per her GI doctor.

## 2020-08-18 NOTE — Progress Notes (Signed)
Nursing Pain Medication Assessment:  Safety precautions to be maintained throughout the outpatient stay will include: orient to surroundings, keep bed in low position, maintain call bell within reach at all times, provide assistance with transfer out of bed and ambulation.  Medication Inspection Compliance: Pill count conducted under aseptic conditions, in front of the patient. Neither the pills nor the bottle was removed from the patient's sight at any time. Once count was completed pills were immediately returned to the patient in their original bottle.  Medication: Morphine ER (MSContin) Pill/Patch Count: 26 of 120 pills remain Pill/Patch Appearance: Markings consistent with prescribed medication Bottle Appearance: Standard pharmacy container. Clearly labeled. Filled Date: 05 / 13/ 2022 Last Medication intake:  Today

## 2020-08-18 NOTE — Telephone Encounter (Signed)
Pt c/o of Chest Pain: STAT if CP now or developed within 24 hours  1. Are you having CP right now? YES  2. Are you experiencing any other symptoms (ex. SOB, nausea, vomiting, sweating)? SWEATING NAUSEATED  3. How long have you been experiencing CP? SINCE LAST WEEK INTERMITTENT   4. Is your CP continuous or coming and going? COMES AND GOES S/P ABLATION  5. Have you taken Nitroglycerin? UNKNOWN ?

## 2020-08-18 NOTE — Patient Instructions (Signed)
____________________________________________________________________________________________  Medication Rules  Purpose: To inform patients, and their family members, of our rules and regulations.  Applies to: All patients receiving prescriptions (written or electronic).  Pharmacy of record: Pharmacy where electronic prescriptions will be sent. If written prescriptions are taken to a different pharmacy, please inform the nursing staff. The pharmacy listed in the electronic medical record should be the one where you would like electronic prescriptions to be sent.  Electronic prescriptions: In compliance with the [place] Strengthen Opioid Misuse Prevention (STOP) Act of 2017 (Session Law 2017-74/H243), effective March 13, 2018, all controlled substances must be electronically prescribed. Calling prescriptions to the pharmacy will cease to exist.  Prescription refills: Only during scheduled appointments. Applies to all prescriptions.  NOTE: The following applies primarily to controlled substances (Opioid* Pain Medications).   Type of encounter (visit): For patients receiving controlled substances, face-to-face visits are required. (Not an option or up to the patient.)  Patient's responsibilities: 1. Pain Pills: Bring all pain pills to every appointment (except for procedure appointments). 2. Pill Bottles: Bring pills in original pharmacy bottle. Always bring the newest bottle. Bring bottle, even if empty. 3. Medication refills: You are responsible for knowing and keeping track of what medications you take and those you need refilled. The day before your appointment: write a list of all prescriptions that need to be refilled. The day of the appointment: give the list to the admitting nurse. Prescriptions will be written only during appointments. No prescriptions will be written on procedure days. If you forget a medication: it will not be "Called in", "Faxed", or "electronically sent".  You will need to get another appointment to get these prescribed. No early refills. Do not call asking to have your prescription filled early. 4. Prescription Accuracy: You are responsible for carefully inspecting your prescriptions before leaving our office. Have the discharge nurse carefully go over each prescription with you, before taking them home. Make sure that your name is accurately spelled, that your address is correct. Check the name and dose of your medication to make sure it is accurate. Check the number of pills, and the written instructions to make sure they are clear and accurate. Make sure that you are given enough medication to last until your next medication refill appointment. 5. Taking Medication: Take medication as prescribed. When it comes to controlled substances, taking less pills or less frequently than prescribed is permitted and encouraged. Never take more pills than instructed. Never take medication more frequently than prescribed.  6. Inform other Doctors: Always inform, all of your healthcare providers, of all the medications you take. 7. Pain Medication from other Providers: You are not allowed to accept any additional pain medication from any other Doctor or Healthcare provider. There are two exceptions to this rule. (see below) In the event that you require additional pain medication, you are responsible for notifying us, as stated below. 8. Cough Medicine: Often these contain an opioid, such as codeine or hydrocodone. Never accept or take cough medicine containing these opioids if you are already taking an opioid* medication. The combination may cause respiratory failure and death. 9. Medication Agreement: You are responsible for carefully reading and following our Medication Agreement. This must be signed before receiving any prescriptions from our practice. Safely store a copy of your signed Agreement. Violations to the Agreement will result in no further prescriptions.  (Additional copies of our Medication Agreement are available upon request.) 10. Laws, Rules, & Regulations: All patients are expected to follow all   Federal and State Laws, Statutes, Rules, & Regulations. Ignorance of the Laws does not constitute a valid excuse.  11. Illegal drugs and Controlled Substances: The use of illegal substances (including, but not limited to marijuana and its derivatives) and/or the illegal use of any controlled substances is strictly prohibited. Violation of this rule may result in the immediate and permanent discontinuation of any and all prescriptions being written by our practice. The use of any illegal substances is prohibited. 12. Adopted CDC guidelines & recommendations: Target dosing levels will be at or below 60 MME/day. Use of benzodiazepines** is not recommended.  Exceptions: There are only two exceptions to the rule of not receiving pain medications from other Healthcare Providers. 1. Exception #1 (Emergencies): In the event of an emergency (i.e.: accident requiring emergency care), you are allowed to receive additional pain medication. However, you are responsible for: As soon as you are able, call our office [phone], at any time of the day or night, and leave a message stating your name, the date and nature of the emergency, and the name and dose of the medication prescribed. In the event that your call is answered by a member of our staff, make sure to document and save the date, time, and the name of the person that took your information.  2. Exception #2 (Planned Surgery): In the event that you are scheduled by another doctor or dentist to have any type of surgery or procedure, you are allowed (for a period no longer than 30 days), to receive additional pain medication, for the acute post-op pain. However, in this case, you are responsible for picking up a copy of our "Post-op Pain Management for Surgeons" handout, and giving it to your surgeon or dentist. This  document is available at our office, and does not require an appointment to obtain it. Simply go to our office during business hours (Monday-Thursday from 8:00 AM to 4:00 PM) (Friday 8:00 AM to 12:00 Noon) or if you have a scheduled appointment with us, prior to your surgery, and ask for it by name. In addition, you are responsible for: calling our office [phone], at any time of the day or night, and leaving a message stating your name, name of your surgeon, type of surgery, and date of procedure or surgery. Failure to comply with your responsibilities may result in termination of therapy involving the controlled substances.  *Opioid medications include: morphine, codeine, oxycodone, oxymorphone, hydrocodone, hydromorphone, meperidine, tramadol, tapentadol, buprenorphine, fentanyl, methadone. **Benzodiazepine medications include: diazepam (Valium), alprazolam (Xanax), clonazepam (Klonopine), lorazepam (Ativan), clorazepate (Tranxene), chlordiazepoxide (Librium), estazolam (Prosom), oxazepam (Serax), temazepam (Restoril), triazolam (Halcion) (Last updated: 02/09/2020) ____________________________________________________________________________________________   ____________________________________________________________________________________________  Medication Recommendations and Reminders  Applies to: All patients receiving prescriptions (written and/or electronic).  Medication Rules & Regulations: These rules and regulations exist for your safety and that of others. They are not flexible and neither are we. Dismissing or ignoring them will be considered "non-compliance" with medication therapy, resulting in complete and irreversible termination of such therapy. (See document titled "Medication Rules" for more details.) In all conscience, because of safety reasons, we cannot continue providing a therapy where the patient does not follow instructions.  Pharmacy of record:   Definition:  This is the pharmacy where your electronic prescriptions will be sent.   We do not endorse any particular pharmacy, however, we have experienced problems with [facility] not securing enough medication supply for the community.  We do not restrict you in your choice of pharmacy. However,   once we write for your prescriptions, we will NOT be re-sending more prescriptions to fix restricted supply problems created by your pharmacy, or your insurance.   The pharmacy listed in the electronic medical record should be the one where you want electronic prescriptions to be sent.  If you choose to change pharmacy, simply notify our nursing staff.  Recommendations:  Keep all of your pain medications in a safe place, under lock and key, even if you live alone. We will NOT replace lost, stolen, or damaged medication.  After you fill your prescription, take 1 week's worth of pills and put them away in a safe place. You should keep a separate, properly labeled bottle for this purpose. The remainder should be kept in the original bottle. Use this as your primary supply, until it runs out. Once it's gone, then you know that you have 1 week's worth of medicine, and it is time to come in for a prescription refill. If you do this correctly, it is unlikely that you will ever run out of medicine.  To make sure that the above recommendation works, it is very important that you make sure your medication refill appointments are scheduled at least 1 week before you run out of medicine. To do this in an effective manner, make sure that you do not leave the office without scheduling your next medication management appointment. Always ask the nursing staff to show you in your prescription , when your medication will be running out. Then arrange for the receptionist to get you a return appointment, at least 7 days before you run out of medicine. Do not wait until you have 1 or 2 pills left, to come in. This is very poor planning and  does not take into consideration that we may need to cancel appointments due to bad weather, sickness, or emergencies affecting our staff.  DO NOT ACCEPT A "Partial Fill": If for any reason your pharmacy does not have enough pills/tablets to completely fill or refill your prescription, do not allow for a "partial fill". The law allows the pharmacy to complete that prescription within 72 hours, without requiring a new prescription. If they do not fill the rest of your prescription within those 72 hours, you will need a separate prescription to fill the remaining amount, which we will NOT provide. If the reason for the partial fill is your insurance, you will need to talk to the pharmacist about payment alternatives for the remaining tablets, but again, DO NOT ACCEPT A PARTIAL FILL, unless you can trust your pharmacist to obtain the remainder of the pills within 72 hours.  Prescription refills and/or changes in medication(s):   Prescription refills, and/or changes in dose or medication, will be conducted only during scheduled medication management appointments. (Applies to both, written and electronic prescriptions.)  No refills on procedure days. No medication will be changed or started on procedure days. No changes, adjustments, and/or refills will be conducted on a procedure day. Doing so will interfere with the diagnostic portion of the procedure.  No phone refills. No medications will be "called into the pharmacy".  No Fax refills.  No weekend refills.  No Holliday refills.  No after hours refills.  Remember:  Business hours are:  Monday to Thursday 8:00 AM to 4:00 PM Provider's Schedule: Thoams Siefert, MD - Appointments are:  Medication management: Monday and Wednesday 8:00 AM to 4:00 PM Procedure day: Tuesday and Thursday 7:30 AM to 4:00 PM Bilal Lateef, MD - Appointments are:    Medication management: Tuesday and Thursday 8:00 AM to 4:00 PM Procedure day: Monday and Wednesday  7:30 AM to 4:00 PM (Last update: 10/01/2019) ____________________________________________________________________________________________   ____________________________________________________________________________________________  CBD (cannabidiol) WARNING  Applicable to: All individuals currently taking or considering taking CBD (cannabidiol) and, more important, all patients taking opioid analgesic controlled substances (pain medication). (Example: oxycodone; oxymorphone; hydrocodone; hydromorphone; morphine; methadone; tramadol; tapentadol; fentanyl; buprenorphine; butorphanol; dextromethorphan; meperidine; codeine; etc.)  Legal status: CBD remains a Schedule I drug prohibited for any use. CBD is illegal with one exception. In the [place], CBD has a limited Food and Drug Administration (FDA) approval for the treatment of two specific types of epilepsy disorders. Only one CBD product has been approved by the FDA for this purpose: "Epidiolex". FDA is aware that some companies are marketing products containing cannabis and cannabis-derived compounds in ways that violate the Federal Food, Drug and Cosmetic Act (FD&C Act) and that may put the health and safety of consumers at risk. The FDA, a Federal agency, has not enforced the CBD status since 2018.   Legality: Some manufacturers ship CBD products nationally, which is illegal. Often such products are sold online and are therefore available throughout the country. CBD is openly sold in head shops and health food stores in some states where such sales have not been explicitly legalized. Selling unapproved products with unsubstantiated therapeutic claims is not only a violation of the law, but also can put patients at risk, as these products have not been proven to be safe or effective. Federal illegality makes it difficult to conduct research on CBD.  Reference: "FDA Regulation of Cannabis and Cannabis-Derived Products, Including Cannabidiol  (CBD)" - https://www.fda.gov/news-events/public-health-focus/fda-regulation-cannabis-and-cannabis-derived-products-including-cannabidiol-cbd  Warning: CBD is not FDA approved and has not undergo the same manufacturing controls as prescription drugs.  This means that the purity and safety of available CBD may be questionable. Most of the time, despite manufacturer's claims, it is contaminated with THC (delta-9-tetrahydrocannabinol - the chemical in marijuana responsible for the "HIGH").  When this is the case, the THC contaminant will trigger a positive urine drug screen (UDS) test for Marijuana (carboxy-THC). Because a positive UDS for any illicit substance is a violation of our medication agreement, your opioid analgesics (pain medicine) may be permanently discontinued.  MORE ABOUT CBD  General Information: CBD  is a derivative of the Marijuana (cannabis sativa) plant discovered in 1940. It is one of the 113 identified substances found in Marijuana. It accounts for up to 40% of the plant's extract. As of 2018, preliminary clinical studies on CBD included research for the treatment of anxiety, movement disorders, and pain. CBD is available and consumed in multiple forms, including inhalation of smoke or vapor, as an aerosol spray, and by mouth. It may be supplied as an oil containing CBD, capsules, dried cannabis, or as a liquid solution. CBD is thought not to be as psychoactive as THC (delta-9-tetrahydrocannabinol - the chemical in marijuana responsible for the "HIGH"). Studies suggest that CBD may interact with different biological target receptors in the body, including cannabinoid and other neurotransmitter receptors. As of 2018 the mechanism of action for its biological effects has not been determined.  Side-effects  Adverse reactions: Dry mouth, diarrhea, decreased appetite, fatigue, drowsiness, malaise, weakness, sleep disturbances, and others.  Drug interactions: CBC may interact with other  medications such as blood-thinners. (Last update: 10/18/2019) ____________________________________________________________________________________________   ____________________________________________________________________________________________  Drug Holidays (Slow)  What is a "Drug Holiday"? Drug Holiday: is the name given to the period of time during   which a patient stops taking a medication(s) for the purpose of eliminating tolerance to the drug.  Benefits . Improved effectiveness of opioids. . Decreased opioid dose needed to achieve benefits. . Improved pain with lesser dose.  What is tolerance? Tolerance: is the progressive decreased in effectiveness of a drug due to its repetitive use. With repetitive use, the body gets use to the medication and as a consequence, it loses its effectiveness. This is a common problem seen with opioid pain medications. As a result, a larger dose of the drug is needed to achieve the same effect that used to be obtained with a smaller dose.  How long should a "Drug Holiday" last? You should stay off of the pain medicine for at least 14 consecutive days. (2 weeks)  Should I stop the medicine "cold turkey"? No. You should always coordinate with your Pain Specialist so that he/she can provide you with the correct medication dose to make the transition as smoothly as possible.  How do I stop the medicine? Slowly. You will be instructed to decrease the daily amount of pills that you take by one (1) pill every seven (7) days. This is called a "slow downward taper" of your dose. For example: if you normally take four (4) pills per day, you will be asked to drop this dose to three (3) pills per day for seven (7) days, then to two (2) pills per day for seven (7) days, then to one (1) per day for seven (7) days, and at the end of those last seven (7) days, this is when the "Drug Holiday" would start.   Will I have withdrawals? By doing a "slow downward  taper" like this one, it is unlikely that you will experience any significant withdrawal symptoms. Typically, what triggers withdrawals is the sudden stop of a high dose opioid therapy. Withdrawals can usually be avoided by slowly decreasing the dose over a prolonged period of time. If you do not follow these instructions and decide to stop your medication abruptly, withdrawals may be possible.  What are withdrawals? Withdrawals: refers to the wide range of symptoms that occur after stopping or dramatically reducing opiate drugs after heavy and prolonged use. Withdrawal symptoms do not occur to patients that use low dose opioids, or those who take the medication sporadically. Contrary to benzodiazepine (example: Valium, Xanax, etc.) or alcohol withdrawals ("Delirium Tremens"), opioid withdrawals are not lethal. Withdrawals are the physical manifestation of the body getting rid of the excess receptors.  Expected Symptoms Early symptoms of withdrawal may include: . Agitation . Anxiety . Muscle aches . Increased tearing . Insomnia . Runny nose . Sweating . Yawning  Late symptoms of withdrawal may include: . Abdominal cramping . Diarrhea . Dilated pupils . Goose bumps . Nausea . Vomiting  Will I experience withdrawals? Due to the slow nature of the taper, it is very unlikely that you will experience any.  What is a slow taper? Taper: refers to the gradual decrease in dose.  (Last update: 10/01/2019) ____________________________________________________________________________________________     

## 2020-08-23 ENCOUNTER — Other Ambulatory Visit: Payer: Self-pay | Admitting: *Deleted

## 2020-08-23 MED ORDER — AMIODARONE HCL 200 MG PO TABS: 200.0000 mg | ORAL_TABLET | Freq: Every day | ORAL | 0 refills | Status: DC

## 2020-08-23 NOTE — Telephone Encounter (Signed)
Scheduled

## 2020-08-24 DIAGNOSIS — G4733 Obstructive sleep apnea (adult) (pediatric): Secondary | ICD-10-CM | POA: Diagnosis not present

## 2020-08-25 LAB — TOXASSURE SELECT 13 (MW), URINE

## 2020-08-30 ENCOUNTER — Telehealth: Payer: Self-pay | Admitting: Cardiology

## 2020-08-30 NOTE — Telephone Encounter (Signed)
Called patient back and discussed her medications with her. I informed her that she should only be taking the Amiodarone 200mg  daily as we stopped her Amlodipine in Feb. 2022. Patient stated that she has not been taking the Amiodarone and was taking the Amlodipine 5 mg daily.  I told her to get rid of her Amlodipine and restart her Amiodarone tonight. She has her first appointment with the [facility] tomorrow with [facility], will route to him for review.

## 2020-08-30 NOTE — Telephone Encounter (Signed)
Pt c/o medication issue:  1. Name of Medication: amlodipine 5 mg po q d vs amiodarone 200 mg po q d   2. How are you currently taking this medication (dosage and times per day)? Amlodipine 5 mg po q d   3. Are you having a reaction (difficulty breathing--STAT)? no  4. What is your medication issue? Patient reports she has been taking amlodipine 5 mg po q d but recently found a bottle of amiodarone 200 mg po q d in her bag.    She is now confused if she is supposed to be taking both or one of above and if so which one.

## 2020-08-31 ENCOUNTER — Encounter (HOSPITAL_COMMUNITY): Payer: Self-pay | Admitting: Physician Assistant

## 2020-08-31 ENCOUNTER — Ambulatory Visit (HOSPITAL_COMMUNITY)
Admission: RE | Admit: 2020-08-31 | Discharge: 2020-08-31 | Disposition: A | Discharge status: Home / Self Care | Payer: Medicare Other | Source: Ambulatory Visit | Attending: Physician Assistant | Admitting: Physician Assistant

## 2020-08-31 ENCOUNTER — Other Ambulatory Visit: Payer: Self-pay

## 2020-08-31 VITALS — BP 122/78 | HR 75 | Ht 64.0 in | Wt 141.0 lb

## 2020-08-31 DIAGNOSIS — Z79899 Other long term (current) drug therapy: Secondary | ICD-10-CM | POA: Diagnosis not present

## 2020-08-31 DIAGNOSIS — I4892 Unspecified atrial flutter: Secondary | ICD-10-CM | POA: Diagnosis not present

## 2020-08-31 DIAGNOSIS — I4819 Other persistent atrial fibrillation: Secondary | ICD-10-CM | POA: Diagnosis not present

## 2020-08-31 DIAGNOSIS — G4733 Obstructive sleep apnea (adult) (pediatric): Secondary | ICD-10-CM | POA: Insufficient documentation

## 2020-08-31 DIAGNOSIS — I10 Essential (primary) hypertension: Secondary | ICD-10-CM | POA: Diagnosis not present

## 2020-08-31 DIAGNOSIS — Z7901 Long term (current) use of anticoagulants: Secondary | ICD-10-CM | POA: Insufficient documentation

## 2020-08-31 NOTE — Progress Notes (Signed)
Primary Care Physician: Virginia Crews, MD Primary Cardiologist: Dr Garen Lah Primary Electrophysiologist: Dr Caryl Comes Referring Physician: Dr Servando Snare is a 60 y.o. female with a history of OSA, HTN, atrial flutter, atrial fibrillation who presents for follow up in the [facility]. Patient is on Eliquis for a CHADS2VASC score of 5. She had been maintained on amiodarone but underwent afib and flutter ablation on 08/06/20 in order to avoid the off target effects of amio.   On follow up today, patient reports she did have some post op CP which resolved with ibuprofen (phone note 08/18/20). She was inadvertently taking amlodipine instead of continuing amiodarone. She is now back on amiodarone. She has had a few episodes of heart racing since the ablation while off amiodarone.   Today, she denies symptoms of shortness of breath, orthopnea, PND, lower extremity edema, dizziness, presyncope, syncope, snoring, daytime somnolence, bleeding, or neurologic sequela. The patient is tolerating medications without difficulties and is otherwise without complaint today.    Atrial Fibrillation Risk Factors:  she does have symptoms or diagnosis of sleep apnea. she is compliant with CPAP therapy. she does not have a history of rheumatic fever.   she has a BMI of Body mass index is 24.2 kg/m.Marland Kitchen Filed Weights   08/31/20 1346  Weight: 64 kg    Family History  Problem Relation Age of Onset   Aneurysm Mother    Aneurysm Maternal Grandmother    Breast cancer Paternal Grandmother 39   Bipolar disorder Sister    Bipolar disorder Grandchild    Anxiety disorder Grandchild    Depression Grandchild      Atrial Fibrillation Management history:  Previous antiarrhythmic drugs: amiodarone  Previous cardioversions: none Previous ablations: 08/06/20 CHADS2VASC score: 5 Anticoagulation history: Eliquis   Past Medical History:  Diagnosis Date   A-fib ([place])  2021   Abnormal EKG    HX OF INVERTED T WAVES ON EKG, PALPITATIONS, CHEST PAINS-CARDIAC WORK UP DID NOT SHOW ANY HEART DISEASE   AC (acromioclavicular) joint bone spurs    Acute postoperative pain 01/04/2017   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    Chronic headaches    Chronic, continuous use of opioids    DDD (degenerative disc disease)    CERVICAL AND LUMBAR-CHRONIC PAIN, RT HIP LABRAL TEAR   Depression    PT STATES A LOT OF STRESS IN HER LIFE   Dissociative disorder    Dizziness 04/22/2013   Duodenal ulcer with hemorrhage and perforation ([place]) 04/27/2003   Foot drop, right    FROM BACK SURGERY   GERD (gastroesophageal reflux disease)    H/O arthrodesis 08/18/2014   Headache(784.0)    AND NECK PAIN--STATES RECENT TEST SHOW CERVICAL DEGENERATION   History of blood transfusion    s/p back surgery   History of cardioversion    History of cervical spinal surgery 01/04/2015   History of kidney stones    Hypertension    Inverted T wave    Narrowing of intervertebral disc space 08/18/2014   Currently on disability.    Orthostatic hypotension 04/22/2013   Pain    CHRONIC NECK AND BACK PAIN - LIMITED ROM NECK - S/P FUSIONS CERVICAL AND LUMBAR   Pneumonia    PONV (postoperative nausea and vomiting)  PT GIVES HX OF N&V AND FEVER WITH SURGERIES YEARS AGO--BUT NO PROBLEMS WITH MORE RECENT SURGERIES--STATES NOT MALIGNANT HYPERTHERMIA   Postop Hyponatremia 05/14/2012   Postoperative anemia due to acute blood loss 05/14/2012   PTSD (post-traumatic stress disorder)    Right foot drop    Right hip arthralgia 08/18/2014   Status post surgery of right, and now needs left.    Sleep apnea 2021   does not have a cpap   Sleep apnea    Therapeutic opioid-induced constipation (OIC)    Past Surgical History:  Procedure Laterality Date   ABDOMINAL HYSTERECTOMY     afib   05/2019   ANTERIOR CERVICAL DECOMP/DISCECTOMY FUSION N/A 10/30/2012   Procedure: ACDF C5-6, EXPLORATION AND HARDWARE REMOVAL C6-7;  Surgeon: Melina Schools, MD;  Location: [place];  Service: Orthopedics;  Laterality: N/A;   ANTERIOR FUSION CERVICAL SPINE  MAY 2012   AT [place] N/A 08/06/2020   Procedure: ATRIAL FIBRILLATION ABLATION;  Surgeon: Vickie Epley, MD;  Location: [place] [facility];  Service: Cardiovascular;  Laterality: N/A;   BACK SURGERY  2009   LUMBAR FUSION WITH RODS    BREAST BIOPSY Right 2008   benign.- bx/clip   BREAST EXCISIONAL BIOPSY Left 1998   benign   BREAST REDUCTION SURGERY Bilateral 06/2016   CARDIAC ELECTROPHYSIOLOGY MAPPING AND ABLATION     CARDIOVERSION N/A 12/03/2019   Procedure: CARDIOVERSION;  Surgeon: Kate Sable, MD;  Location: [facility];  Service: Cardiovascular;  Laterality: N/A;   CARPAL TUNNEL RELEASE  05-06-12   Right   CHOLECYSTECTOMY     COLONOSCOPY WITH PROPOFOL N/A 01/26/2017   Procedure: COLONOSCOPY WITH PROPOFOL;  Surgeon: Lucilla Lame, MD;  Location: [place];  Service: Endoscopy;  Laterality: N/A;   DIAGNOSTIC LAPAROSCOPIES - MULTIPLE FOR ENDOMETRIOSIS     ESOPHAGEAL DILATION N/A 01/26/2017   Procedure: ESOPHAGEAL DILATION;  Surgeon: Lucilla Lame, MD;  Location: [place];  Service: Endoscopy;  Laterality: N/A;   ESOPHAGEAL MANOMETRY N/A 05/30/2017   Procedure: ESOPHAGEAL MANOMETRY (EM);  Surgeon: Lucilla Lame, MD;  Location: [facility];  Service: Endoscopy;  Laterality: N/A;   ESOPHAGOGASTRODUODENOSCOPY (EGD) WITH PROPOFOL N/A 01/26/2017   Procedure: ESOPHAGOGASTRODUODENOSCOPY (EGD) WITH PROPOFOL;  Surgeon: Lucilla Lame, MD;  Location: [place];  Service: Endoscopy;  Laterality: N/A;   ESOPHAGOGASTRODUODENOSCOPY (EGD) WITH PROPOFOL N/A 01/30/2020   Procedure: ESOPHAGOGASTRODUODENOSCOPY (EGD) WITH PROPOFOL;  Surgeon: Lucilla Lame, MD;  Location: [place];   Service: Endoscopy;  Laterality: N/A;  sleep apnea COVID + 01-15-20   HIP ARTHROSCOPY  09/20/2011   Procedure: ARTHROSCOPY HIP;  Surgeon: Gearlean Alf, MD;  Location: [facility];  Service: Orthopedics;  Laterality: Right;  Right Hip Scope with Labral Debridement   LEFT HEART CATH AND CORONARY ANGIOGRAPHY Left 06/10/2019   Procedure: LEFT HEART CATH AND CORONARY ANGIOGRAPHY;  Surgeon: Nelva Bush, MD;  Location: [place] [facility];  Service: Cardiovascular;  Laterality: Left;   NASAL SEPTUM SURGERY  MARCH 2013   IN [facility] IMPEDANCE STUDY N/A 05/30/2017   Procedure: [place] IMPEDANCE STUDY;  Surgeon: Lucilla Lame, MD;  Location: [facility];  Service: Endoscopy;  Laterality: N/A;   POLYPECTOMY N/A 01/26/2017   Procedure: POLYPECTOMY;  Surgeon: Lucilla Lame, MD;  Location: [place];  Service: Endoscopy;  Laterality: N/A;   POSTERIOR CERVICAL FUSION/FORAMINOTOMY N/A 04/16/2013   Procedure: REMOVAL CERVICAL PLATES AND INTERBODY CAGE/POSTERIOR CERVICAL SPINAL FUSION C4 - C6/C5 CORPECTOMY/C4 - C6 FUSION WITH  ILIAC CREST BONE GRAFT;  Surgeon: Melina Schools, MD;  Location: [place];  Service: Orthopedics;  Laterality: N/A;   RADIOFREQUENCY ABLATION NERVES     REDUCTION MAMMAPLASTY Bilateral 05/2016   RIGHT HIP ARTHROSCOPY FOR LABRAL TEAR  ABOUT 2010   2012 also   SHOULDER ARTHROSCOPY  05-06-12   bone spur   TEE WITHOUT CARDIOVERSION N/A 08/05/2020   Procedure: TRANSESOPHAGEAL ECHOCARDIOGRAM (TEE);  Surgeon: Lelon Perla, MD;  Location: [facility];  Service: Cardiovascular;  Laterality: N/A;   TONSILLECTOMY     AS A CHILD   TOTAL HIP ARTHROPLASTY Right 05/13/2012   Procedure: TOTAL HIP ARTHROPLASTY ANTERIOR APPROACH;  Surgeon: Gearlean Alf, MD;  Location: [facility];  Service: Orthopedics;  Laterality: Right;    Current Outpatient Medications  Medication Sig Dispense Refill   acetaminophen (TYLENOL) 500 MG tablet Take 500-1,000 mg by mouth every 6 (six) hours as needed for  moderate pain.      ALPRAZolam (XANAX) 1 MG tablet Take 1 mg by mouth at bedtime.      amiodarone (PACERONE) 200 MG tablet Take 1 tablet (200 mg total) by mouth daily. 30 tablet 0   apixaban (ELIQUIS) 5 MG TABS tablet Take 1 tablet (5 mg total) by mouth 2 (two) times daily. 60 tablet 5   busPIRone (BUSPAR) 7.5 MG tablet Take 7.5 mg by mouth 2 (two) times daily.     dicyclomine (BENTYL) 20 MG tablet Take 1 tablet (20 mg total) by mouth 3 (three) times daily before meals. 90 tablet 3   LINZESS 290 MCG CAPS capsule TAKE 1 CAPSULE BY MOUTH DAILY BEFORE BREAKFAST. 30 capsule 6   Menthol, Topical Analgesic, (BENGAY EX) Apply 1 application topically daily as needed (pain).     montelukast (SINGULAIR) 10 MG tablet TAKE 1 TABLET BY MOUTH EVERY DAY 90 tablet 1   morphine (MSIR) 15 MG tablet Take 1 tablet (15 mg total) by mouth every 6 (six) hours as needed for moderate pain or severe pain. Must last 30 days. 120 tablet 0   [START ON 09/18/2020] morphine (MSIR) 15 MG tablet Take 1 tablet (15 mg total) by mouth every 6 (six) hours as needed for moderate pain or severe pain. Must last 30 days. 120 tablet 0   [START ON 10/18/2020] morphine (MSIR) 15 MG tablet Take 1 tablet (15 mg total) by mouth every 6 (six) hours as needed for moderate pain or severe pain. Must last 30 days. 120 tablet 0   naphazoline-pheniramine (VISINE) 0.025-0.3 % ophthalmic solution 1 drop 4 (four) times daily as needed for eye irritation.     ondansetron (ZOFRAN ODT) 4 MG disintegrating tablet Take 1 tablet (4 mg total) by mouth every 8 (eight) hours as needed for nausea or vomiting. 20 tablet 0   pantoprazole (PROTONIX) 40 MG tablet TAKE 1 TABLET BY MOUTH TWICE A DAY 180 tablet 3   polyethylene glycol (MIRALAX / GLYCOLAX) 17 g packet Take 17 g by mouth daily.     sodium chloride (OCEAN) 0.65 % SOLN nasal spray Place 1 spray into both nostrils daily as needed for congestion ([place]).     valACYclovir (VALTREX) 1000 MG tablet TAKE TWO TABLETS  BY MOUTH TWICE DAILY FOR ONE DAY FEVER BLISTER 4 tablet 5   No current facility-administered medications for this encounter.    Allergies  Allergen Reactions   Amoxicillin Hives    She did ok w ANCEF   Chlorhexidine Gluconate Dermatitis and Hives   Clindamycin Hives   Codeine  Hives   Erythromycin Hives    "mycins" in general   Penicillin G Hives    "cillins" in general   Sulfa Antibiotics Nausea And Vomiting and Hives        Levofloxacin Hives   Shellfish Allergy Hives   Decadron [Dexamethasone] Other (See Comments)    Hot flashes, insomnia, "manic" Hot flashes, insomnia, "manic"   Mangifera Indica Hives    papaya   Papaya Derivatives Hives   Betadine [Povidone Iodine] Hives        Clarithromycin Hives   Other Hives     Mango    Povidone-Iodine Hives   Prednisone Anxiety    High blood pressure, flushed, mood changes, heart palpitations High blood pressure, flushed, mood changes, heart palpitations    Social History   Socioeconomic History   Marital status: Married    Spouse name: bruce   Number of children: 2   Years of education: Not on file   Highest education level: Associate degree: occupational, Hotel manager, or vocational program  Occupational History    Comment: disabled  Tobacco Use   Smoking status: Never   Smokeless tobacco: Never  Vaping Use   Vaping Use: Never used  Substance and Sexual Activity   Alcohol use: No    Alcohol/week: 0.0 standard drinks   Drug use: Yes    Comment: prescribed morphine and xanax   Sexual activity: Yes  Other Topics Concern   Not on file  Social History Narrative   Son, daughter  And 4 grandkids; raises 3 of the grandchildren   Social Determinants of Health   Financial Resource Strain: High Risk   Difficulty of Paying Living Expenses: Very hard  Food Insecurity: Landscape architect Present   Worried About Charity fundraiser in the Last Year: Sometimes true   Arboriculturist in the Last Year: Often true   Transportation Needs: Public librarian (Medical): Yes   Lack of Transportation (Non-Medical): Yes  Physical Activity: Inactive   Days of Exercise per Week: 0 days   Minutes of Exercise per Session: 0 min  Stress: Stress Concern Present   Feeling of Stress : Very much  Social Connections: Moderately Isolated   Frequency of Communication with Friends and Family: More than three times a week   Frequency of Social Gatherings with Friends and Family: Never   Attends Religious Services: Never   Marine scientist or Organizations: No   Attends Music therapist: Never   Marital Status: Married  Human resources officer Violence: Not At Risk   Fear of Current or Ex-Partner: No   Emotionally Abused: No   Physically Abused: No   Sexually Abused: No     ROS- All systems are reviewed and negative except as per the HPI above.  Physical Exam: Vitals:   08/31/20 1346  BP: 122/78  Pulse: 75  Weight: 64 kg  Height: 5\' 4"  (1.626 m)    GEN- The patient is a well appearing female, alert and oriented x 3 today.   Head- normocephalic, atraumatic Eyes-  Sclera clear, conjunctiva pink Ears- hearing intact Oropharynx- clear Neck- supple  Lungs- Clear to ausculation bilaterally, normal work of breathing Heart- Regular rate and rhythm, no murmurs, rubs or gallops  GI- soft, NT, ND, + BS Extremities- no clubbing, cyanosis, or edema MS- no significant deformity or atrophy Skin- no rash or lesion Psych- euthymic mood, full affect Neuro- strength and sensation are intact  Wt Readings from  Last 3 Encounters:  08/31/20 64 kg  08/18/20 63.5 kg  08/16/20 64.2 kg    EKG today demonstrates  SR Vent. rate 75 BPM PR interval 142 ms QRS duration 84 ms QT/QTcB 404/451 ms  Echo 07/22/20 demonstrated   1. Left ventricular ejection fraction, by estimation, is 60 to 65%. The  left ventricle has normal function. The left ventricle has no regional  wall  motion abnormalities. Left ventricular diastolic parameters are  indeterminate.   2. Right ventricular systolic function is normal. The right ventricular  size is normal. There is mildly elevated pulmonary artery systolic  pressure. The estimated right ventricular systolic pressure is 54.6 mmHg.   3. Left atrial size was mild to moderately dilated.   4. Unable to exclude mild mitral valve prolapse of the posterior leaflet.  Moderate mitral valve regurgitation. No evidence of mitral stenosis.   Epic records are reviewed at length today  CHA2DS2-VASc Score = 2  The patient's score is based upon: CHF History: No HTN History: Yes Diabetes History: No Stroke History: No Vascular Disease History: No Age Score: 0 Gender Score: 1      ASSESSMENT AND PLAN: 1. Persistent Atrial Fibrillation/atrial flutter The patient's CHA2DS2-VASc score is 2, indicating a 2.2% annual risk of stroke.   S/p afib and flutter ablation 08/06/20 Reassured patient that some breakthrough episodes of afib post ablation are not uncommon.  Continue amiodarone 200 mg daily. Anticipate this will be short term. Continue Eliquis 5 mg BID with no missed doses for at least 3 months post ablation.   2. HTN Stable, no changes today.  3. Obstructive sleep apnea The importance of adequate treatment of sleep apnea was discussed today in order to improve our ability to maintain sinus rhythm long term. Encouraged compliance with CPAP therapy.    Follow up with Dr Garen Lah and Dr Quentin Ore as scheduled.    [facility] [address] [place], [place] [address] [phone] 08/31/2020 1:55 PM

## 2020-09-02 DIAGNOSIS — G4733 Obstructive sleep apnea (adult) (pediatric): Secondary | ICD-10-CM | POA: Diagnosis not present

## 2020-09-08 ENCOUNTER — Ambulatory Visit (HOSPITAL_COMMUNITY): Payer: Medicare Other | Admitting: Physician Assistant

## 2020-09-21 ENCOUNTER — Other Ambulatory Visit: Payer: Self-pay | Admitting: *Deleted

## 2020-09-21 MED ORDER — AMIODARONE HCL 200 MG PO TABS: 200.0000 mg | ORAL_TABLET | Freq: Every day | ORAL | 3 refills | Status: DC

## 2020-09-23 DIAGNOSIS — G4733 Obstructive sleep apnea (adult) (pediatric): Secondary | ICD-10-CM | POA: Diagnosis not present

## 2020-10-01 ENCOUNTER — Encounter: Payer: Self-pay | Admitting: Nurse Practitioner

## 2020-10-01 ENCOUNTER — Other Ambulatory Visit: Payer: Self-pay

## 2020-10-01 ENCOUNTER — Ambulatory Visit: Payer: Medicare Other | Admitting: Cardiology

## 2020-10-01 ENCOUNTER — Ambulatory Visit: Payer: Medicare Other | Admitting: Nurse Practitioner

## 2020-10-01 VITALS — BP 132/82 | HR 67 | Ht 64.0 in | Wt 142.0 lb

## 2020-10-01 DIAGNOSIS — I4819 Other persistent atrial fibrillation: Secondary | ICD-10-CM | POA: Diagnosis not present

## 2020-10-01 DIAGNOSIS — I10 Essential (primary) hypertension: Secondary | ICD-10-CM | POA: Diagnosis not present

## 2020-10-01 DIAGNOSIS — I483 Typical atrial flutter: Secondary | ICD-10-CM

## 2020-10-01 NOTE — Patient Instructions (Signed)
Medication Instructions:  No changes at this time.  *If you need a refill on your cardiac medications before your next appointment, please call your pharmacy*   Lab Work: None  If you have labs (blood work) drawn today and your tests are completely normal, you will receive your results only by: Advance (if you have MyChart) OR A paper copy in the mail If you have any lab test that is abnormal or we need to change your treatment, we will call you to review the results.   Testing/Procedures: None    Follow-Up: At [facility], you and your health needs are our priority.  As part of our continuing mission to provide you with exceptional heart care, we have created designated Provider Care Teams.  These Care Teams include your primary Cardiologist (physician) and Advanced Practice Providers (APPs -  Physician Assistants and Nurse Practitioners) who all work together to provide you with the care you need, when you need it.   Your next appointment:   Keep upcoming appointment on November 10, 2020 at 10:00 am with Dr. Quentin Ore

## 2020-10-01 NOTE — Progress Notes (Signed)
Office Visit    Patient Name: [name] Date of Encounter: 10/01/2020  Primary Care Provider:  Virginia Crews, MD Primary Cardiologist:  Kate Sable, MD  Chief Complaint    60 year old female with a history of paroxysmal atrial fibrillation/flutter, hypertension, asthma, anxiety, GERD, and sleep apnea, who presents for follow-up of paroxysmal atrial fibrillation.  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    Chronic headaches    Chronic, continuous use of opioids    DDD (degenerative disc disease)    CERVICAL AND LUMBAR-CHRONIC PAIN, RT HIP LABRAL TEAR   Depression    PT STATES A LOT OF STRESS IN HER LIFE   Dissociative disorder    Dizziness 04/22/2013   Duodenal ulcer with hemorrhage and perforation ([place]) 04/27/2003   Foot drop, right    FROM BACK SURGERY   GERD (gastroesophageal reflux disease)    H/O arthrodesis 08/18/2014   Headache(784.0)    AND NECK PAIN--STATES RECENT TEST SHOW CERVICAL DEGENERATION   History of blood transfusion    s/p back surgery   History of cardiac cath    a. 05/2019 Cath: Nl cors. EF 55-65%.   History of cardioversion    History of cervical spinal surgery 01/04/2015   History of kidney stones    Hypertension    Inverted T wave    Mitral regurgitation    a. 07/2020 Echo: EF 60-65%, no rwma. Nl RV size/fxn. RVSP 39.97mHg. Mildly to mod dil LA. Mod MR; b. 07/2020 TEE: EF 55-60%. Lambl's excresence. Nl RV size/fxn. Midly dil LA. No LA/LAA thrombus. Mild MR.   Narrowing of intervertebral disc space 08/18/2014   Currently on  disability.    Orthostatic hypotension 04/22/2013   Pain    CHRONIC NECK AND BACK PAIN - LIMITED ROM NECK - S/P FUSIONS CERVICAL AND LUMBAR   [place]   a. 07/2020 s/p PVI.   Pneumonia    PONV (postoperative nausea and vomiting)    PT GIVES HX OF N&V AND FEVER WITH SURGERIES YEARS AGO--BUT NO PROBLEMS WITH MORE RECENT SURGERIES--STATES NOT MALIGNANT HYPERTHERMIA   Postop Hyponatremia 05/14/2012   Postoperative anemia due to acute blood loss 05/14/2012   PTSD (post-traumatic stress disorder)    Right foot drop    Right hip arthralgia 08/18/2014   Status post surgery of right, and now needs left.    Sleep apnea 2021   does not have a cpap   Therapeutic opioid-induced constipation ([facility])    Typical atrial flutter ([facility]    Past Surgical History:  Procedure Laterality Date   ABDOMINAL HYSTERECTOMY     afib  05/2019   ANTERIOR CERVICAL DECOMP/DISCECTOMY FUSION N/A 10/30/2012   Procedure: ACDF C5-6, EXPLORATION AND HARDWARE REMOVAL C6-7;  Surgeon: DMelina Schools MD;  [place]  Service: Orthopedics;  Laterality: N/A;   ANTERIOR FUS[place] CERVICAL [place]  [place]Y 2012   [place]   Procedure: ATRIAL FIBRILLATION ABLATION;  Surgeon: LVickie Epley  MD;  Location: [place] [facility];  Service: Cardiovascular;  Laterality: N/A;   BACK SURGERY  2009   LUMBAR FUSION WITH RODS    BREAST BIOPSY Right 2008   benign.- bx/clip   BREAST EXCISIONAL BIOPSY Left 1998   benign   BREAST REDUCTION SURGERY Bilateral 06/2016   CARDIAC ELECTROPHYSIOLOGY MAPPING AND ABLATION     CARDIOVERSION N/A 12/03/2019   Procedure: CARDIOVERSION;  Surgeon: Kate Sable, MD;  Location: [facility];  Service: Cardiovascular;  Laterality: N/A;   CARPAL TUNNEL RELEASE  05-06-12   Right   CHOLECYSTECTOMY     COLONOSCOPY WITH PROPOFOL N/A 01/26/2017   Procedure: COLONOSCOPY WITH PROPOFOL;  Surgeon: Lucilla Lame, MD;  Location: [place];  Service: Endoscopy;  Laterality: N/A;   DIAGNOSTIC LAPAROSCOPIES - MULTIPLE FOR ENDOMETRIOSIS     ESOPHAGEAL DILATION N/A 01/26/2017   Procedure: ESOPHAGEAL DILATION;  Surgeon: Lucilla Lame, MD;  Location: [place];  Service: Endoscopy;  Laterality: N/A;   ESOPHAGEAL MANOMETRY N/A 05/30/2017   Procedure: ESOPHAGEAL MANOMETRY (EM);  Surgeon: Lucilla Lame, MD;  Location: [facility];  Service: Endoscopy;  Laterality: N/A;   ESOPHAGOGASTRODUODENOSCOPY (EGD) WITH PROPOFOL N/A 01/26/2017   Procedure: ESOPHAGOGASTRODUODENOSCOPY (EGD) WITH PROPOFOL;  Surgeon: Lucilla Lame, MD;  Location: [place];  Service: Endoscopy;  Laterality: N/A;   ESOPHAGOGASTRODUODENOSCOPY (EGD) WITH PROPOFOL N/A 01/30/2020   Procedure: ESOPHAGOGASTRODUODENOSCOPY (EGD) WITH PROPOFOL;  Surgeon: Lucilla Lame, MD;  Location: [place];  Service: Endoscopy;  Laterality: N/A;  sleep apnea COVID + 01-15-20   HIP ARTHROSCOPY  09/20/2011   Procedure: ARTHROSCOPY HIP;  Surgeon: Gearlean Alf, MD;  [facility];  Service: Orthopedics;  Laterality: Right;  Right Hip Scope with Labral Debridement   LEFT HEART CATH AND CORONARY ANGIOGRAPHY Left 06/10/2019   Procedure: LEFT HEART CATH AND CORONARY ANGIOGRAPHY;  Surgeon: Nelva Bush, MD;  Location: [place] [facility];  Service: Cardiovascular;  Laterality: Left;   NASAL SEPTUM SURGERY  MARCH 2013   IN [place]   [facility] IMPEDANCE STUDY N/A 05/30/2017   Procedure: [place] IMPEDANCE STUDY;  Surgeon: Lucilla Lame, MD;  Location: [facility];  Service: Endoscopy;  Laterality: N/A;   POLYPECTOMY N/A 01/26/2017   Procedure: POLYPECTOMY;  Surgeon: Lucilla Lame, MD;  Location: [place];  Service: Endoscopy;  Laterality: N/A;   POSTERIOR CERVICAL FUSION/FORAMINOTOMY N/A 04/16/2013   Procedure: REMOVAL CERVICAL PLATES AND INTERBODY CAGE/POSTERIOR CERVICAL SPINAL FUSION C4 - C6/C5 CORPECTOMY/C4 - C6 FUSION WITH ILIAC CREST BONE GRAFT;  Surgeon:  Melina Schools, MD;  Location: [place];  Service: Orthopedics;  Laterality: N/A;   RADIOFREQUENCY ABLATION NERVES     REDUCTION MAMMAPLASTY Bilateral 05/2016   RIGHT HIP ARTHROSCOPY FOR LABRAL TEAR  ABOUT 2010   2012 also   SHOULDER ARTHROSCOPY  05-06-12   bone spur   TEE WITHOUT CARDIOVERSION N/A 08/05/2020   Procedure: TRANSESOPHAGEAL ECHOCARDIOGRAM (TEE);  Surgeon: Lelon Perla, MD;  Location[facility] END[facility];  Service: Cardiovascular;  Laterality: N/A;   TONSILLECTOMY     AS A CHILD   TOTAL HIP ARTHROPLASTY Right 05/13/2012   Procedure: TOTAL HIP ARTHROPLASTY ANTERIOR APPROACH;  Surgeon: Gearlean Alf, MD;  [facility];  Service: Orthopedics;  Laterality: Right;    Allergies  Allergies  Allergen Reactions   Amoxicillin Hives    She did ok w ANCEF   Chlorhexidine Gluconate Dermatitis and Hives   Clindamycin Hives   Codeine Hives   Erythromycin Hives    "mycins" in general  Penicillin G Hives    "cillins" in general   Sulfa Antibiotics Nausea And Vomiting and Hives        Levofloxacin Hives   Shellfish Allergy Hives   Decadron [Dexamethasone] Other (See Comments)    Hot flashes, insomnia, "manic" Hot flashes, insomnia, "manic"   Mangifera Indica Hives    papaya   Papaya Derivatives Hives   Betadine [Povidone Iodine] Hives        Clarithromycin Hives   Other Hives     Mango    Povidone-Iodine Hives   Prednisone Anxiety    High blood pressure, flushed, mood changes, heart palpitations High blood pressure, flushed, mood changes, heart palpitations    History of Present Illness    60 year old female with the above past medical history including paroxysmal atrial fibrillation/flutter, hypertension, sleep apnea, asthma, anxiety, and GERD.  She previously underwent diagnostic catheterization in March 2021 which did not show any significant CAD.  Echo in April 2021 showed normal LV function.  Monitoring showed A. fib with a 3% burden she was placed on beta-blocker and  Eliquis.  She subsequently required cardioversion and amiodarone therapy was started in September 2021.  Follow-up monitoring in March 2022 showed runs of SVT and atrial fibrillation as well as bradycardia with rates to the 30s.  She was referred to electrophysiology and initially seen by Dr. Caryl Comes who felt that minimizing exposure to amiodarone was appropriate, especially as it might be playing a role in her bradycardia.  She was referred to Dr. Quentin Ore for consideration of catheter ablation and subsequently underwent successful ablation for A. fib and flutter in May of this year.    She has continued on amiodarone and Eliquis and notes that she continues to experience fatigue which dates back to her initial diagnosis of atrial arrhythmias and now wonders if perhaps amiodarone is playing a role.  She does have intermittent mild nausea and chronic intermittent anorexia dating back prior to A. fib diagnosis.  She occasionally notes palpitations but these are occurring only about 1 or 2 times per week, lasting a few seconds, and resolving spontaneously.  She denies chest pain, dyspnea, PND, orthopnea, dizziness, syncope, or edema.  Home Medications    Current Outpatient Medications  Medication Sig Dispense Refill   acetaminophen (TYLENOL) 500 MG tablet Take 500-1,000 mg by mouth every 6 (six) hours as needed for moderate pain.      ALPRAZolam (XANAX) 1 MG tablet Take 1 mg by mouth at bedtime.      amiodarone (PACERONE) 200 MG tablet Take 1 tablet (200 mg total) by mouth daily. 30 tablet 3   apixaban (ELIQUIS) 5 MG TABS tablet Take 1 tablet (5 mg total) by mouth 2 (two) times daily. 60 tablet 5   busPIRone (BUSPAR) 7.5 MG tablet Take 7.5 mg by mouth 2 (two) times daily.     dicyclomine (BENTYL) 20 MG tablet Take 1 tablet (20 mg total) by mouth 3 (three) times daily before meals. 90 tablet 3   LINZESS 290 MCG CAPS capsule TAKE 1 CAPSULE BY MOUTH DAILY BEFORE BREAKFAST. 30 capsule 6   Menthol, Topical  Analgesic, (BENGAY EX) Apply 1 application topically daily as needed (pain).     montelukast (SINGULAIR) 10 MG tablet TAKE 1 TABLET BY MOUTH EVERY DAY 90 tablet 1   morphine (MSIR) 15 MG tablet Take 1 tablet (15 mg total) by mouth every 6 (six) hours as needed for moderate pain or severe pain. Must last 30 days. 120 tablet 0  morphine (MSIR) 15 MG tablet Take 1 tablet (15 mg total) by mouth every 6 (six) hours as needed for moderate pain or severe pain. Must last 30 days. 120 tablet 0   [START ON 10/18/2020] morphine (MSIR) 15 MG tablet Take 1 tablet (15 mg total) by mouth every 6 (six) hours as needed for moderate pain or severe pain. Must last 30 days. 120 tablet 0   naphazoline-pheniramine (VISINE) 0.025-0.3 % ophthalmic solution 1 drop 4 (four) times daily as needed for eye irritation.     ondansetron (ZOFRAN ODT) 4 MG disintegrating tablet Take 1 tablet (4 mg total) by mouth every 8 (eight) hours as needed for nausea or vomiting. (Patient not taking: Reported on 10/01/2020) 20 tablet 0   pantoprazole (PROTONIX) 40 MG tablet TAKE 1 TABLET BY MOUTH TWICE A DAY 180 tablet 3   polyethylene glycol (MIRALAX / GLYCOLAX) 17 g packet Take 17 g by mouth daily.     sodium chloride (OCEAN) 0.65 % SOLN nasal spray Place 1 spray into both nostrils daily as needed for congestion ([place]).     valACYclovir (VALTREX) 1000 MG tablet TAKE TWO TABLETS BY MOUTH TWICE DAILY FOR ONE DAY FEVER BLISTER 4 tablet 5   No current facility-administered medications for this visit.     Review of Systems    Occasional brief palpitations.  No persistent tach arrhythmias.  Ongoing fatigue.  She denies chest pain, dyspnea, PND, orthopnea, dizziness, syncope, edema.  She does occasionally note mild nausea and has chronic intermittent anorexia..  All other systems reviewed and are otherwise negative except as noted above.  Physical Exam    VS:  BP 132/82 (BP Location: Left Arm, Patient Position: Sitting, Cuff Size: Normal)    Pulse 67   Ht '5\' 4"'$  (1.626 m)   Wt 142 lb (64.4 kg)   SpO2 99%   BMI 24.37 kg/m  , BMI Body mass index is 24.37 kg/m.     GEN: Well nourished, well developed, in no acute distress. HEENT: normal. Neck: Supple, no JVD, carotid bruits, or masses. Cardiac: RRR, no murmurs, rubs, or gallops. No clubbing, cyanosis, edema.  Radials/DP/PT 2+ and equal bilaterally.  Respiratory:  Respirations regular and unlabored, clear to auscultation bilaterally. GI: Soft, nontender, nondistended, BS + x 4. MS: no deformity or atrophy. Skin: warm and dry, no rash. Neuro:  Strength and sensation are intact. Psych: Normal affect.  Accessory Clinical Findings    ECG personally reviewed by me today -regular sinus rhythm, 67, no acute ST or T changes - no acute changes.  Lab Results  Component Value Date   WBC 5.8 07/22/2020   HGB 11.0 (L) 07/22/2020   HCT 33.9 (L) 07/22/2020   MCV 80.3 07/22/2020   PLT 336 07/22/2020   Lab Results  Component Value Date   CREATININE 0.74 07/22/2020   BUN 9 07/22/2020   NA 137 07/22/2020   K 3.7 07/22/2020   CL 105 07/22/2020   CO2 23 07/22/2020   Lab Results  Component Value Date   ALT 13 05/01/2020   AST 20 05/01/2020   ALKPHOS 98 05/01/2020   BILITOT 0.6 05/01/2020   Lab Results  Component Value Date   CHOL 183 01/13/2020   HDL 64 01/13/2020   LDLCALC 99 01/13/2020   TRIG 116 01/13/2020   CHOLHDL 2.9 01/13/2020    Lab Results  Component Value Date   HGBA1C 5.1 01/27/2016    Assessment & Plan    1.  Paroxysmal atrial fibrillation/flutter: Status  post catheter ablation in May.  She has ongoing fatigue but only rare and brief palpitations since ablation.  She remains on amiodarone and Eliquis therapy and questions whether or not amiodarone may be contributing to symptoms of fatigue, and mild intermittent nausea and anorexia.  She prefers to remain on amiodarone to complete a 44-monthcourse post catheter ablation, as was previously outlined.  She is  tolerating Eliquis well.  She will follow-up with EP on August 31.  2.  Essential hypertension: Blood pressure is 132/82 today.  She is not on any antihypertensives of hypertension is listed in her past medical history.  3.  History of sinus bradycardia: Noted on previous monitoring in March of this year.  She denies presyncope or syncope.  No significant bradycardia or evidence of heart block on EKG today.  As previously noted, likely plan for discontinuation of amiodarone 3 months post ablation.  4.  Obstructive sleep apnea: CPAP compliance encouraged.  5.  Disposition: Follow-up with Dr. LQuentin Oreas planned on August 31.   CMurray Hodgkins NP 10/01/2020, 5:59 PM

## 2020-10-20 ENCOUNTER — Telehealth: Payer: Self-pay | Admitting: *Deleted

## 2020-10-20 NOTE — Chronic Care Management (AMB) (Signed)
  Chronic Care Management   Outreach Note  10/20/2020 Name: [name] MRN: [id number] DOB: [birth year]  [name] is a 60 y.o. year old female who is a primary care patient of Brita Romp, Dionne Bucy, MD. I reached out to [name] by phone today in response to a referral sent by Ms. Sindy Messing Rubano's PCP Virginia Crews, MD     An unsuccessful telephone outreach was attempted today. The patient was referred to the case management team for assistance with care management and care coordination.   Follow Up Plan: A HIPAA compliant phone message was left for the patient providing contact information and requesting a return call.  If patient returns call to provider office, please advise to call Embedded Care Management Care Guide Caroleann Casler at [place], [place] Management  Direct Dial: [phone]

## 2020-10-21 NOTE — Chronic Care Management (AMB) (Signed)
  Chronic Care Management   Note  10/21/2020 Name: [name] MRN: [id number] DOB: Jan 28, 1961  AMIYRAH LAMERE is a 60 y.o. year old female who is a primary care patient of Brita Romp, Dionne Bucy, MD. I reached out to [name] by phone today in response to a referral sent by Ms. Sindy Messing Jagi[place]'s PCP Virginia Crews, MD     [name] was given information about Chronic Care Management services today including:  CCM service includes personalized support from designated clinical staff supervised by her physician, including individualized plan of care and coordination with other care providers 24/7 contact phone numbers for assistance for urgent and routine care needs. Service will only be billed when office clinical staff spend 20 minutes or more in a month to coordinate care. Only one practitioner may furnish and bill the service in a calendar month. The patient may stop CCM services at any time (effective at the end of the month) by phone call to the office staff. The patient will be responsible for cost sharing (co-pay) of up to 20% of the service fee (after annual deductible is met).  Patient agreed to services and verbal consent obtained.   Follow up plan: Telephone appointment with care management team member scheduled for: 11/12/2020  Julian Hy, [place], [place] Management  Direct Dial: [phone]

## 2020-10-24 DIAGNOSIS — G4733 Obstructive sleep apnea (adult) (pediatric): Secondary | ICD-10-CM | POA: Diagnosis not present

## 2020-10-24 NOTE — Progress Notes (Signed)
Patient: [name]  Service Category: E/M  Provider: Gaspar Cola, MD  DOB: 02-25-1961  DOS: 10/27/2020  Location: Office  MRN: [id number]  Setting: Ambulatory outpatient  Referring Provider: Virginia Crews, MD  Type: Established Patient  Specialty: Interventional Pain Management  PCP: Shelby Crews, MD  Location: Remote location  Delivery: TeleHealth     Virtual Encounter - Pain Management PROVIDER NOTE: Information contained herein reflects review and annotations entered in association with encounter. Interpretation of such information and data should be left to medically-trained personnel. Information provided to patient can be located elsewhere in the medical record under "Patient Instructions". Document created using STT-dictation technology, any transcriptional errors that may result from process are unintentional.    Contact & Pharmacy Preferred: [phone] Home: [phone] (home) Mobile: [phone] (mobile) E-mail: No e-mail address on record  [id number] [address] [id number]: [phone]: [phone]  Pre-screening  [name] "in-person" vs "virtual" encounter. She indicated preferring virtual for this encounter.   Reason COVID-19*  Social distancing based on CDC and AMA recommendations.   I contacted [name] 10/27/2020 via telephone.      I clearly identified myself as FGaspar Cola MD. I verified that I was speaking with the correct person using two identifiers (Name: [name] and date of birth: 3Sep 03, 1962.  Consent I sought verbal advanced consent from [name] virtual visit interactions. I informed [name] possible security and privacy concerns, risks, and limitations associated with providing "not-in-person" medical evaluation and management services. I also informed [name] the availability of "in-person" appointments.  Finally, I informed her that there would be a charge for the virtual visit and that she could be  personally, fully or partially, financially responsible for it. [name] understanding and agreed to proceed.   Historic Elements   Ms. RCONSUELLO LASSALLEis a 60y.o. year old, female patient evaluated today after our last contact on 08/18/2020. 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), Anemia, Anxiety, Asthma, Cephalalgia (08/18/2014), Cervical disc disease (08/18/2014), Chronic headaches, Chronic, continuous use of opioids, DDD (degenerative disc disease), Depression, Dissociative disorder, Dizziness (04/22/2013), Duodenal ulcer with hemorrhage and perforation ([place] (04/27/2003), Foot drop, right, GERD (gastroesophageal reflux disease), H/O arthrodesis (08/18/2014), Headache(784.0), History of blood transfusion, History of cardiac cath, History of cardioversion, History of cervical spinal surgery (01/04/2015), History of kidney stones, Hypertension, Inverted T wave, Mitral regurgitation, Narrowing of intervertebral disc space (08/18/2014), Orthostatic hypotension (04/22/2013), Pain, Paroxysmal atrial fibrillation ([place] (2021), Pneumonia, PONV (postoperative nausea and vomiting), Postop Hyponatremia (05/14/2012), Postoperative anemia due to acute blood loss (05/14/2012), PTSD (post-traumatic stress disorder), Right foot drop, Right hip arthralgia (08/18/2014), Sleep apnea (2021), Therapeutic opioid-induced constipation ([facility]), and Typical atrial flutter ([place]. She also  has a past surgical history that includes RIGHT HIP ARTHROSCOPY FOR LABRAL TEAR (ABOUT 2010); Anterior fusion cervical spine (MAY 2012); Nasal septum surgery (MARCH 2013); Back surgery (2009); Tonsillectomy; Abdominal hysterectomy; DIAGNOSTIC LAPAROSCOPIES - MULTIPLE FOR ENDOMETRIOSIS; Cholecystectomy; Hip arthroscopy  (09/20/2011); Shoulder arthroscopy (05-06-12); Carpal tunnel release (05-06-12); Total hip arthroplasty (Right, 05/13/2012); Anterior cervical decomp/discectomy fusion (N/A, 10/30/2012); Posterior cervical fusion/foraminotomy (N/A, 04/16/2013); Breast reduction surgery (Bilateral, 06/2016); Colonoscopy with propofol (N/A, 01/26/2017); Esophagogastroduodenoscopy (egd) with propofol (N/A, 01/26/2017); Esophageal dilation (N/A, 01/26/2017); polypectomy (N/A, 01/26/2017); PH impedance study (N/A,  05/30/2017); Esophageal manometry (N/A, 05/30/2017); Radiofrequency ablation nerves; afib (05/2019); LEFT HEART CATH AND CORONARY ANGIOGRAPHY (Left, 06/10/2019); Cardioversion (N/A, 12/03/2019); Esophagogastroduodenoscopy (egd) with propofol (N/A, 01/30/2020); Reduction mammaplasty (Bilateral, 05/2016); Breast biopsy (Right, 2008); Breast excisional biopsy (Left, 1998); TEE without cardioversion (N/A, 08/05/2020); ATRIAL FIBRILLATION ABLATION (N/A, 08/06/2020); and Cardiac electrophysiology mapping and ablation. [name] has a current medication list which includes the following prescription(s): acetaminophen, alprazolam, amiodarone, apixaban, buspirone, dicyclomine, linzess, menthol (topical analgesic), montelukast, visine, ondansetron, pantoprazole, polyethylene glycol, sodium chloride, valacyclovir, and [START ON 11/17/2020] morphine. She  reports that she has never smoked. She has never used smokeless tobacco. She reports current drug use. She reports that she does not drink alcohol. [name] is allergic to amoxicillin, chlorhexidine gluconate, clindamycin, codeine, erythromycin, penicillin g, sulfa antibiotics, levofloxacin, shellfish allergy, decadron [dexamethasone], mangifera indica, papaya derivatives, betadine [povidone iodine], clarithromycin, other, povidone-iodine, and prednisone.   HPI  Today, she is being contacted for medication management.  Pharmacotherapy Assessment   Analgesic: Morphine IR 15 mg 1 tab p.o. every 6  hours (60 mg/day of morphine) MME/day: 60 mg/day.   Monitoring: [place] PMP: PDMP reviewed during this encounter.       Pharmacotherapy: No side-effects or adverse reactions reported. Compliance: No problems identified. Effectiveness: Clinically acceptable. Plan: Refer to "POC". UDS:  Summary  Date Value Ref Range Status  08/18/2020 Note  Final    Comment:    ==================================================================== [employer] Select 13 (MW) ==================================================================== Test                             Result       Flag       Units  Drug Present and Declared for Prescription Verification   Alprazolam                     170          EXPECTED   ng/mg creat   Alpha-hydroxyalprazolam        484          EXPECTED   ng/mg creat    Source of alprazolam is a scheduled prescription medication. Alpha-    hydroxyalprazolam is an expected metabolite of alprazolam.    Morphine                       >9524        EXPECTED   ng/mg creat   Normorphine                    757          EXPECTED   ng/mg creat    Potential sources of large amounts of morphine in the absence of    codeine include administration of morphine or use of heroin.     Normorphine is an expected metabolite of morphine.    Hydromorphone                  650          EXPECTED   ng/mg creat    Hydromorphone may be present as a metabolite of morphine;    concentrations of hydromorphone rarely exceed 5% of the morphine    concentration when this is the source of hydromorphone.  ==================================================================== Test                      Result    Flag  Units      Ref Range   Creatinine              105              mg/dL      >=20 ==================================================================== Declared Medications:  The flagging and interpretation on this report are based on the  following declared medications.  Unexpected results may arise  from  inaccuracies in the declared medications.   **Note: The testing scope of this panel includes these medications:   Alprazolam (Xanax)  Morphine (MSIR)   **Note: The testing scope of this panel does not include the  following reported medications:   Acetaminophen (Tylenol)  Amiodarone  Apixaban (Eliquis)  Buspirone (Buspar)  Dicyclomine (Bentyl)  Eye Drops  Linaclotide (Linzess)  Menthol  Montelukast (Singulair)  Ondansetron (Zofran)  Pantoprazole (Protonix)  Polyethylene Glycol (MiraLAX)  Sodium Chloride  Valacyclovir (Valtrex) ==================================================================== For clinical consultation, please call [phone]. ====================================================================      Laboratory Chemistry Profile   Renal Lab Results  Component Value Date   BUN 9 07/22/2020   CREATININE 0.74 07/22/2020   BCR 7 (L) 11/11/2018   GFRAA >60 11/26/2019   GFRNONAA >60 07/22/2020    Hepatic Lab Results  Component Value Date   AST 20 05/01/2020   ALT 13 05/01/2020   ALBUMIN 4.5 05/01/2020   ALKPHOS 98 05/01/2020   LIPASE 28 05/01/2020    Electrolytes Lab Results  Component Value Date   NA 137 07/22/2020   K 3.7 07/22/2020   CL 105 07/22/2020   CALCIUM 8.9 07/22/2020   MG 1.9 11/11/2018    Bone Lab Results  Component Value Date   25OHVITD1 23 (L) 11/11/2018   25OHVITD2 9.8 11/11/2018   25OHVITD3 13 11/11/2018    Inflammation (CRP: Acute Phase) (ESR: Chronic Phase) Lab Results  Component Value Date   CRP 12 (H) 11/11/2018   ESRSEDRATE 42 (H) 11/11/2018   LATICACIDVEN 1.2 05/01/2020         Note: Above Lab results reviewed.  Imaging  ECHO TEE    TRANSESOPHOGEAL ECHO REPORT       Patient Name:   [name] Date of Exam: 08/05/2020 Medical Rec #:  [id number]        Height:       64.0 in Accession #:    [id number]       Weight:       142.0 lb Date of Birth:  May 21, 1960        BSA:          1.691  m Patient Age:    58 years         BP:           129/109 mmHg Patient Gender: F                HR:           65 bpm. Exam Location:  Outpatient  Procedure: Transesophageal Echo, 3D Echo, Cardiac Doppler and Color Doppler  Indications:     I48.91* Unspeicified atrial fibrillation   History:         Patient has prior history of Echocardiogram examinations, most                  recent 07/22/2020. Abnormal ECG, Arrythmias:Atrial Fibrillation                  and Atrial Flutter, Signs/Symptoms:Dizziness/Lightheadedness,  Shortness of Breath and Dyspnea; Risk Factors:Hypertension,                  Dyslipidemia and Sleep Apnea.   Sonographer:     Roseanna Rainbow RDCS Referring Phys:  [place] Diagnosing Phys: Kirk Ruths MD  PROCEDURE: After discussion of the risks and benefits of a TEE, an informed consent was obtained from the patient. The transesophogeal probe was passed without difficulty through the esophogus of the patient. Imaged were obtained with the patient in a  left lateral decubitus position. Local oropharyngeal anesthetic was provided with Cetacaine. Sedation performed by different physician. The patient was monitored while under deep sedation. Anesthestetic sedation was provided intravenously by  Anesthesiology: 280m of Propofol. The patient developed no complications during the procedure.  IMPRESSIONS   1. 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  2. Left ventricular ejection fraction, by estimation, is 55 to 60%. The left ventricle has normal function.  3. Right ventricular systolic function is normal. The right ventricular size is normal.  4. Left atrial size was mildly dilated. No left atrial/left atrial appendage thrombus was detected.  5. The mitral valve is normal in structure. Mild mitral valve regurgitation.  6. The aortic valve is tricuspid. Aortic valve regurgitation is trivial.  7. There is mild (Grade II)  plaque involving the descending aorta.  FINDINGS  Left Ventricle: Left ventricular ejection fraction, by estimation, is 55 to 60%. The left ventricle has normal function. The left ventricular internal cavity size was normal in size.  Right Ventricle: The right ventricular size is normal.Right ventricular systolic function is normal.  Left Atrium: Left atrial size was mildly dilated. No left atrial/left atrial appendage thrombus was detected.  Right Atrium: Right atrial size was normal in size.  Pericardium: There is no evidence of pericardial effusion.  Mitral Valve: The mitral valve is normal in structure. Mild mitral valve regurgitation.  Tricuspid Valve: The tricuspid valve is normal in structure. Tricuspid valve regurgitation is mild.  Aortic Valve: The aortic valve is tricuspid. Aortic valve regurgitation is trivial.  Pulmonic Valve: The pulmonic valve was normal in structure. Pulmonic valve regurgitation is trivial.  Aorta: The aortic root is normal in size and structure. There is mild (Grade II) plaque involving the descending aorta.  IAS/Shunts: No atrial level shunt detected by color flow Doppler.  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    MR PISA:        1.57 cm MR PISA Radius: 0.50 cm  BKirk RuthsMD Electronically signed by BKirk RuthsMD Signature Date/Time: 08/05/2020/1:22:55 PM      Final    Assessment  The primary encounter diagnosis was Chronic pain syndrome. Diagnoses of Pharmacologic therapy, Chronic use of opiate for therapeutic purpose, Encounter for medication management, Chronic low back pain (1ry area of Pain) (Bilateral) (L>R) w/o sciatica, Chronic hip pain (2ry area of Pain) (Bilateral) (L>R), and Chronic shoulder pain (3ry area of Pain) (Bilateral) (L>R) were also pertinent to this visit.  Plan of Care  Problem-specific:  No problem-specific Assessment & Plan notes found for this  encounter.  Ms. RBRENETTA PENNYhas a current medication list which includes the following long-term medication(s): amiodarone, apixaban, dicyclomine, linzess, montelukast, pantoprazole, and [START ON 11/17/2020] morphine.  Pharmacotherapy (Medications Ordered): Meds ordered this encounter  Medications   morphine (MSIR) 15 MG tablet    Sig: Take 1 tablet (15 mg total) by mouth every 6 (six) hours as  needed for moderate pain or severe pain. Must last 30 days.    Dispense:  120 tablet    Refill:  0    Not a duplicate. Do NOT delete! Dispense 1 day early if closed on refill date. Avoid benzodiazepines within 8 hours of opioids. Do not send refill requests.    Orders:  No orders of the defined types were placed in this encounter.  Follow-up plan:   Return in about 7 weeks (around 12/17/2020) for Eval-day(M,W), (F2F), (MM).     Interventional Therapies  Risk  Complexity Considerations:   1. SHELLFISH ALLERGY; Dexamethasone & Prednisone allergy.  (NO STEROIDS)  2. NOT a candidate for lumbar facet RFA (due to hardware). 3. NOT a candidate for membrane stabilizers (Neurontin/Lyrica) due to prior failed trials. 4. NOT a candidate for SCS (due to HARDWARE). 5. NOT a candidate for IT Pump (patient's choice due to surgical risks).      Planned  Pending:   Pending further evaluation   Under consideration:   Diagnostic cervico-thoracic facet blocks    Completed:   Diagnostic bilateral lumbar facet block x2 (01/14/2019) Palliative bilateral suprascapular NB x2 (No Steroids) (06/19/2016) Palliative left suprascapular nerve RFA x1 (01/04/2017)   Therapeutic  Palliative (PRN) options:   Diagnostic bilateral lumbar facet block #2  Palliative bilateral suprascapular NB #3 (No Steroids) Palliative left suprascapular nerve RFA #2         Recent Visits Date Type Provider Dept  08/18/20 Office Visit Milinda Pointer, MD [facility]  Showing recent visits within past 90 days and  meeting all other requirements Today's Visits Date Type Provider Dept  10/27/20 Telemedicine Milinda Pointer, MD [facility]  Showing today's visits and meeting all other requirements Future Appointments Date Type Provider Dept  11/30/20 Appointment Milinda Pointer, MD [facility]  Showing future appointments within next 90 days and meeting all other requirements I discussed the assessment and treatment plan with the patient. The patient was provided an opportunity to ask questions and all were answered. The patient agreed with the plan and demonstrated an understanding of the instructions.  Patient advised to call back or seek an in-person evaluation if the symptoms or condition worsens.  Duration of encounter: 12 minutes.  Note by: Shelby Cola, MD Date: 10/27/2020; Time: 1:08 PM

## 2020-10-25 ENCOUNTER — Other Ambulatory Visit: Payer: Self-pay

## 2020-10-25 MED ORDER — APIXABAN 5 MG PO TABS: 5.0000 mg | ORAL_TABLET | Freq: Two times a day (BID) | ORAL | 5 refills | Status: DC

## 2020-10-25 NOTE — Telephone Encounter (Signed)
Eliquis mg refill request received. Patient is 60 years old, weight-64.4 kg, Crea- 0.74 on 07/22/20 via epic, Diagnosis-atrial flutter, and last seen by Ignacia Bayley, NP on 10/01/20. Dose is appropriate based on dosing criteria. Will send in refill to requested pharmacy.

## 2020-10-26 ENCOUNTER — Encounter: Payer: Self-pay | Admitting: Pain Medicine

## 2020-10-27 ENCOUNTER — Ambulatory Visit: Payer: Medicare Other | Attending: Pain Medicine | Admitting: Pain Medicine

## 2020-10-27 ENCOUNTER — Other Ambulatory Visit: Payer: Self-pay

## 2020-10-27 DIAGNOSIS — M25512 Pain in left shoulder: Secondary | ICD-10-CM

## 2020-10-27 DIAGNOSIS — M25552 Pain in left hip: Secondary | ICD-10-CM

## 2020-10-27 DIAGNOSIS — M545 Low back pain, unspecified: Secondary | ICD-10-CM

## 2020-10-27 DIAGNOSIS — Z79899 Other long term (current) drug therapy: Secondary | ICD-10-CM

## 2020-10-27 DIAGNOSIS — G894 Chronic pain syndrome: Secondary | ICD-10-CM | POA: Diagnosis not present

## 2020-10-27 DIAGNOSIS — Z79891 Long term (current) use of opiate analgesic: Secondary | ICD-10-CM

## 2020-10-27 DIAGNOSIS — M25511 Pain in right shoulder: Secondary | ICD-10-CM

## 2020-10-27 DIAGNOSIS — G8929 Other chronic pain: Secondary | ICD-10-CM | POA: Diagnosis not present

## 2020-10-27 DIAGNOSIS — M25551 Pain in right hip: Secondary | ICD-10-CM | POA: Diagnosis not present

## 2020-10-27 MED ORDER — MORPHINE SULFATE 15 MG PO TABS: 15.0000 mg | ORAL_TABLET | Freq: Four times a day (QID) | ORAL | 0 refills | Status: DC | PRN

## 2020-10-31 IMAGING — CR DG CHEST 2V
1 series · 2 of 2 positions shown · non-contrast
Comparison: 05/10/2018

CLINICAL DATA: Shortness of breath

EXAM:
CHEST - 2 VIEW

[Series 1: dg chest 2 view · 0.14mm/px · 2 of 2 slices shown]
[im 1/2]
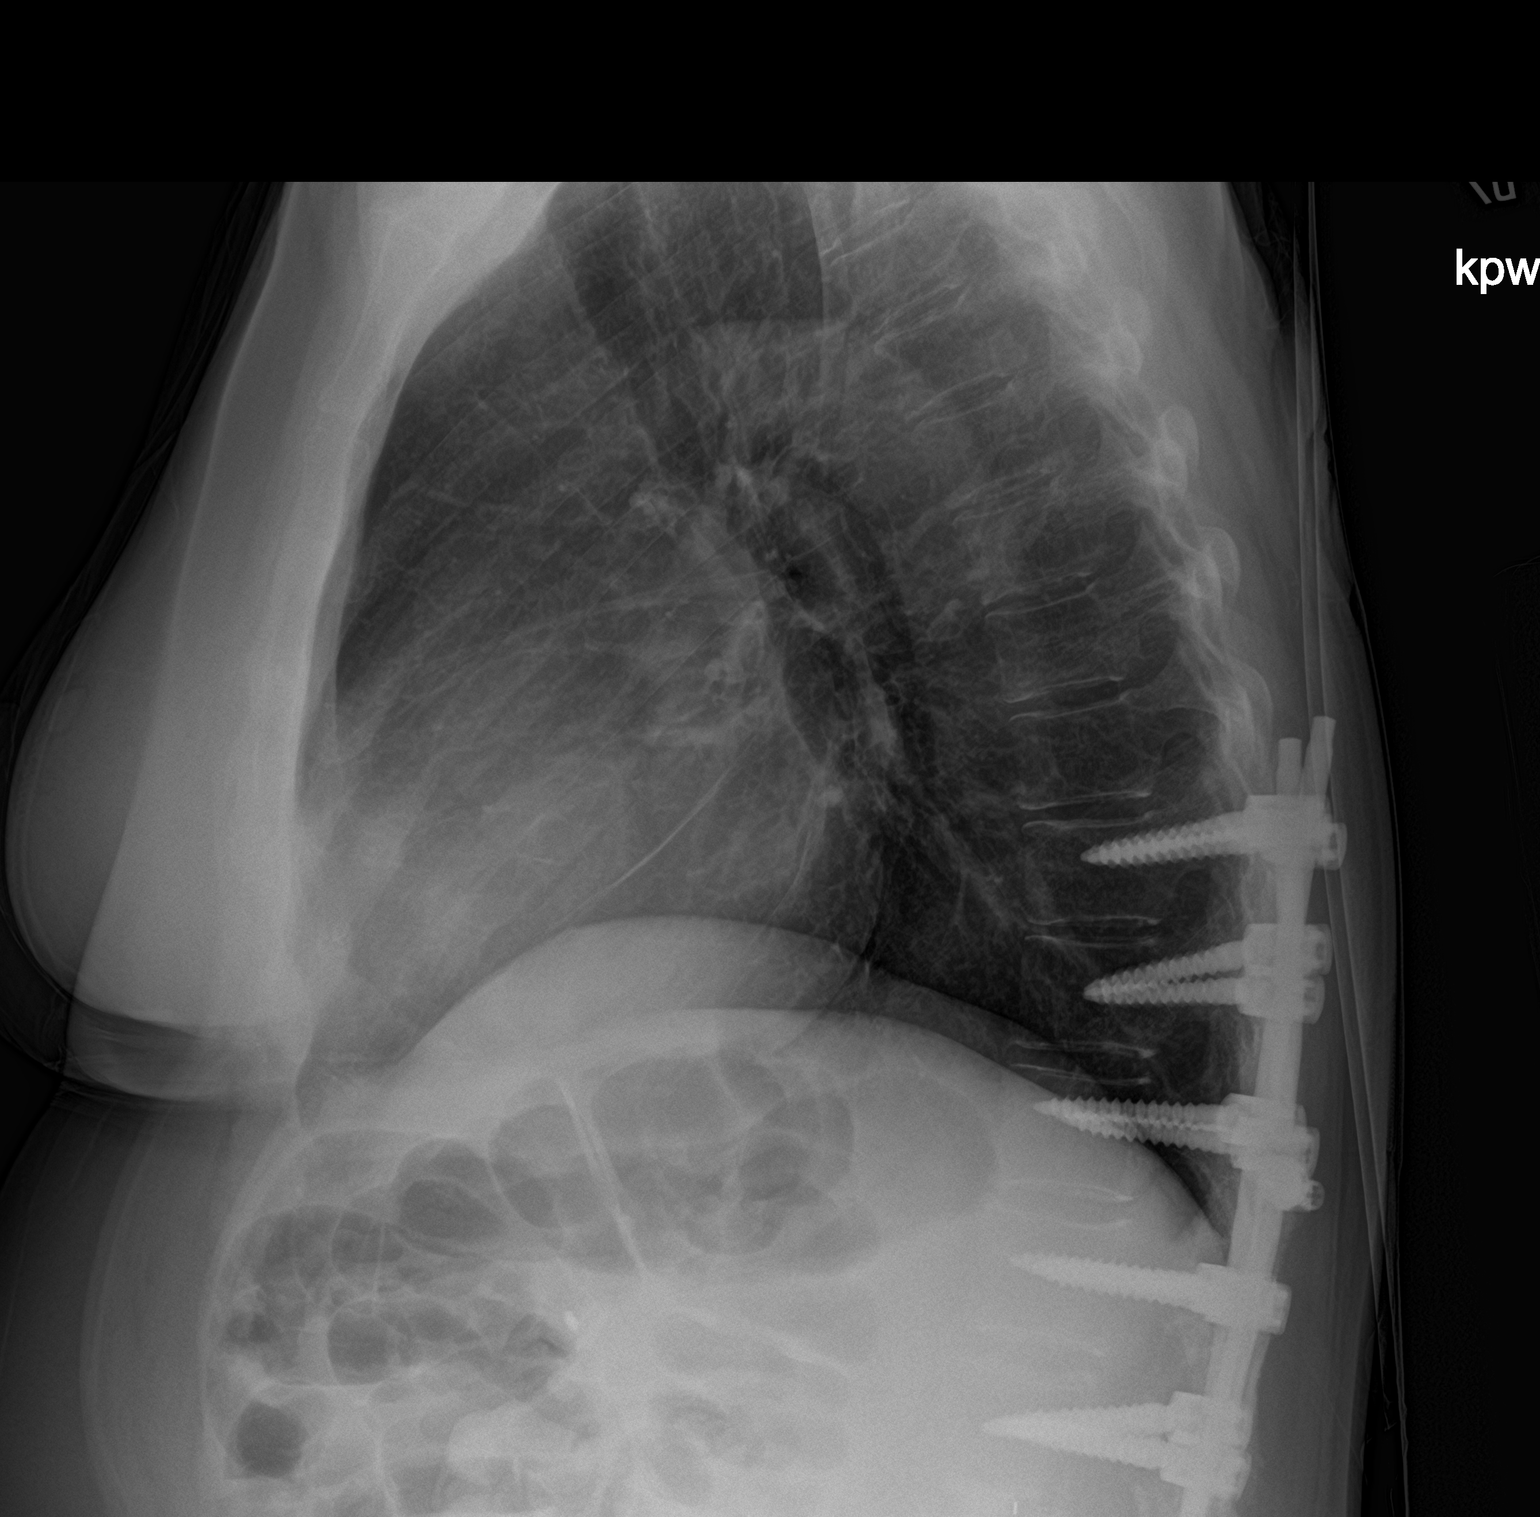
[im 2/2]
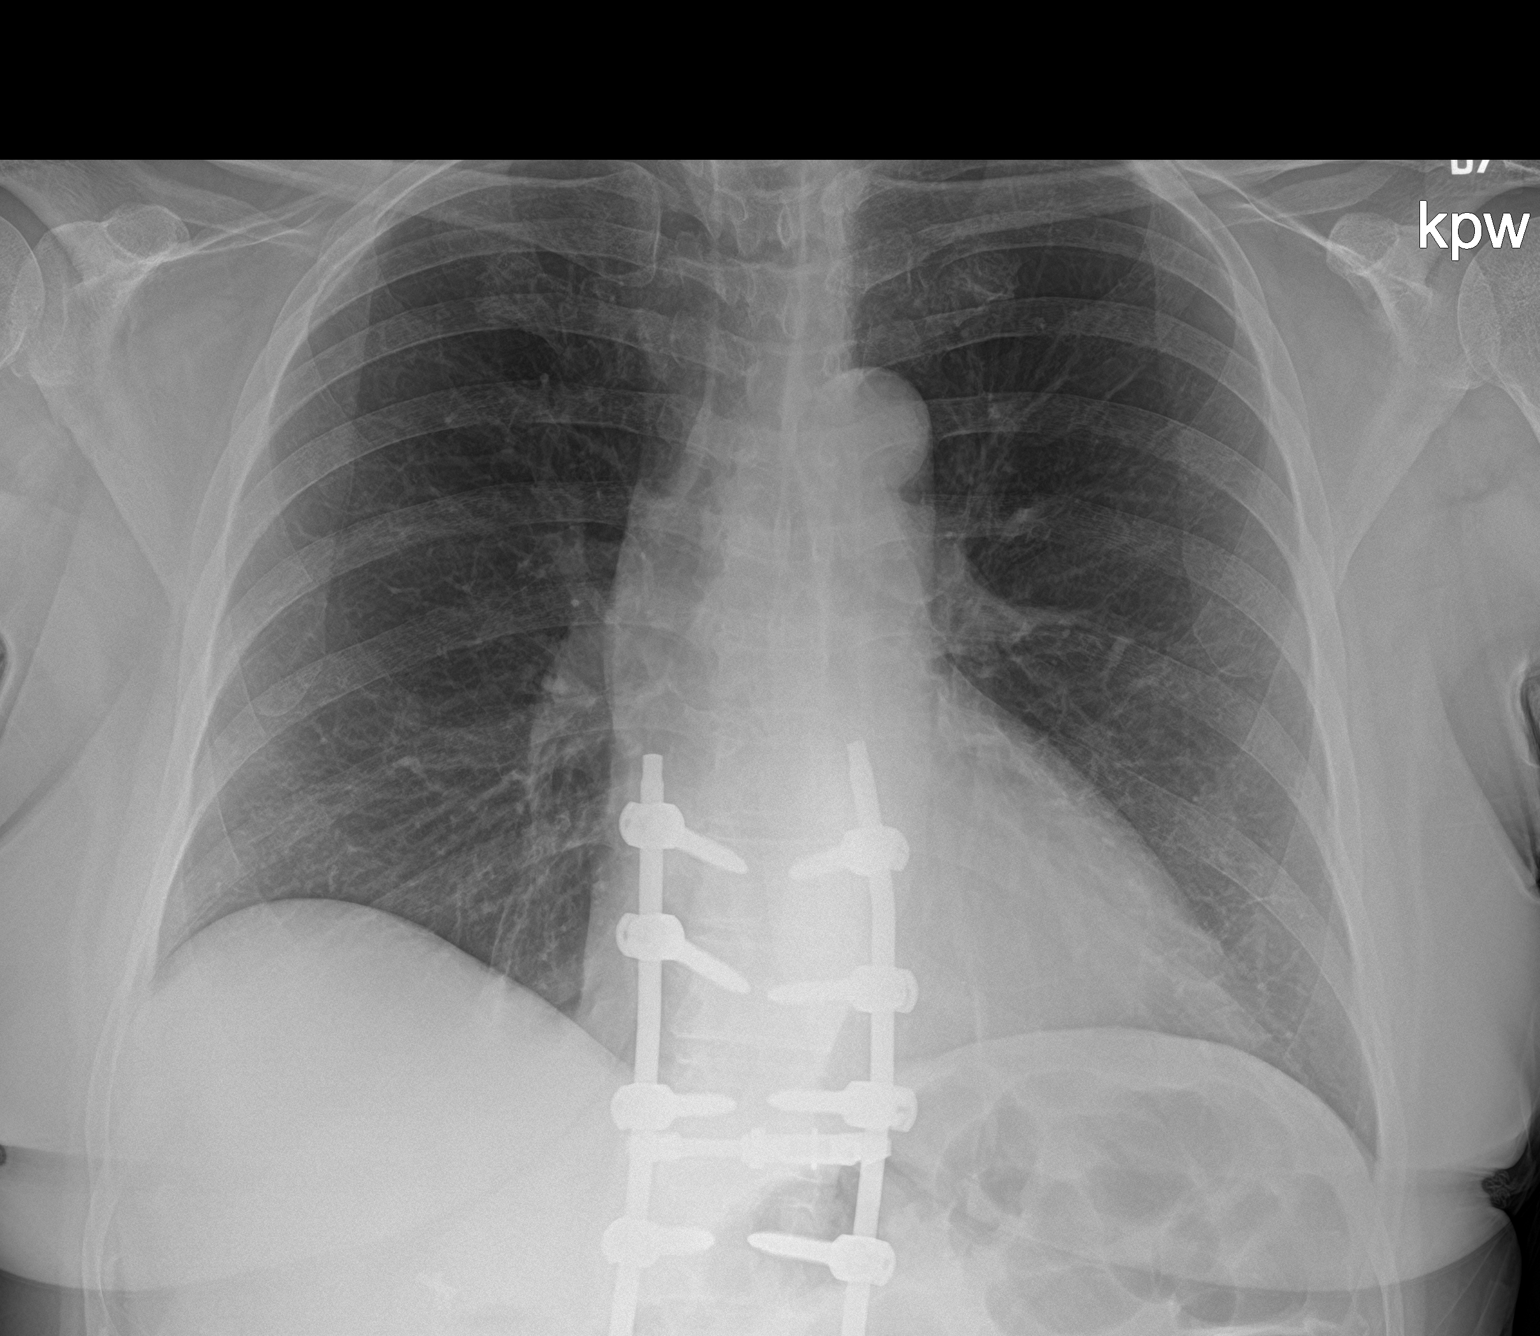

[2 of 2 positions shown; findings below may reference images not displayed]

FINDINGS: Posterior spinal rods and fixating screws in the thoracolumbar
spine. No acute airspace disease or effusion. Normal
cardiomediastinal silhouette. No pneumothorax. Incompletely
visualized cervical hardware.
IMPRESSION: No active cardiopulmonary disease.

## 2020-11-08 ENCOUNTER — Encounter: Payer: Medicare Other | Admitting: Pain Medicine

## 2020-11-10 ENCOUNTER — Ambulatory Visit: Payer: Medicare Other | Admitting: Cardiology

## 2020-11-10 ENCOUNTER — Other Ambulatory Visit: Payer: Self-pay

## 2020-11-10 ENCOUNTER — Encounter: Payer: Self-pay | Admitting: Cardiology

## 2020-11-10 VITALS — BP 140/86 | HR 69 | Ht 64.0 in | Wt 143.0 lb

## 2020-11-10 DIAGNOSIS — I442 Atrioventricular block, complete: Secondary | ICD-10-CM

## 2020-11-10 DIAGNOSIS — I483 Typical atrial flutter: Secondary | ICD-10-CM | POA: Diagnosis not present

## 2020-11-10 DIAGNOSIS — I4819 Other persistent atrial fibrillation: Secondary | ICD-10-CM

## 2020-11-10 NOTE — Progress Notes (Signed)
Electrophysiology Office Follow up Visit Note:    Date:  11/10/2020   ID:  [name], DOB [birth year], MRN [id number]  PCP:  Virginia Crews, MD  [facility] Cardiologist:  Kate Sable, MD  [facility] Electrophysiologist:  Vickie Epley, MD    Interval History:    [name] is a 60 y.o. female who presents for a follow up visit after her atrial fibrillation ablation on Aug 06, 2020.  During the ablation the pulmonary veins and posterior wall were isolated.  The cavotricuspid isthmus was also ablated.  Since the procedure she has done well without any sustained episodes of atrial fibrillation.  She continues to take Eliquis for stroke prophylaxis.  She has been taking amiodarone but is very interested in discontinuing this medication given potential for off target effects.     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    Chronic headaches    Chronic, continuous use of opioids    DDD (degenerative disc disease)    CERVICAL AND LUMBAR-CHRONIC PAIN, RT HIP LABRAL TEAR   Depression    PT STATES A LOT OF STRESS IN HER LIFE   Dissociative disorder    Dizziness 04/22/2013   Duodenal ulcer with hemorrhage and perforation ([place]) 04/27/2003   Foot drop, right    FROM BACK SURGERY   GERD (gastroesophageal reflux disease)    H/O arthrodesis 08/18/2014   Headache(784.0)    AND NECK PAIN--STATES RECENT TEST SHOW CERVICAL DEGENERATION   History of blood transfusion    s/p back surgery   History of cardiac cath    a. 05/2019 Cath: Nl cors. EF 55-65%.   History of cardioversion    History of  cervical spinal surgery 01/04/2015   History of kidney stones    Hypertension    Inverted T wave    Mitral regurgitation    a. 07/2020 Echo: EF 60-65%, no rwma. Nl RV size/fxn. RVSP 39.44mHg. Mildly to mod dil LA. Mod MR; b. 07/2020 TEE: EF 55-60%. Lambl's excresence. Nl RV size/fxn. Midly dil LA. No LA/LAA thrombus. Mild MR.   Narrowing of intervertebral disc space 08/18/2014   Currently on disability.    Orthostatic hypotension 04/22/2013   Pain    CHRONIC NECK AND BACK PAIN - LIMITED ROM NECK - S/P FUSIONS CERVICAL AND LUMBAR   Paroxysmal atrial fibrillation ([place] 2021   a. 07/2020 s/p PVI.   Pneumonia    PONV (postoperative nausea and vomiting)    PT GIVES HX OF N&V AND FEVER WITH SURGERIES YEARS AGO--BUT NO PROBLEMS WITH MORE RECENT SURGERIES--STATES NOT MALIGNANT HYPERTHERMIA   Postop Hyponatremia 05/14/2012   Postoperative anemia due to acute blood loss 05/14/2012   PTSD (post-traumatic stress disorder)    Right foot drop    Right hip arthralgia 08/18/2014   Status post surgery of right, and now needs left.    Sleep apnea 2021   does not have a cpap   Therapeutic opioid-induced constipation (OIC)    [place]     Past Surgical History:  Procedure Laterality Date   ABDOMINAL HYSTERECTOMY     afib  05/2019   ANTERIOR CERVICAL DECOMP/DISCECTOMY FUSION N/A 10/30/2012   Procedure: ACDF C5-6, EXPLORATION AND HARDWARE REMOVAL C6-7;  Surgeon: Melina Schools, MD;  Location: [place];  Service: Orthopedics;  Laterality: N/A;   ANTERIOR FUSION CERVICAL SPINE  MAY 2012   AT [place] N/A 08/06/2020   Procedure: ATRIAL FIBRILLATION ABLATION;  Surgeon: Vickie Epley, MD;  Location: [place] [facility];  Service: Cardiovascular;  Laterality: N/A;   BACK SURGERY  2009   LUMBAR FUSION WITH RODS    BREAST BIOPSY Right 2008   benign.- bx/clip   BREAST EXCISIONAL BIOPSY Left 1998   benign   BREAST REDUCTION SURGERY Bilateral 06/2016   CARDIAC  ELECTROPHYSIOLOGY MAPPING AND ABLATION     CARDIOVERSION N/A 12/03/2019   Procedure: CARDIOVERSION;  Surgeon: Kate Sable, MD;  Location: [facility];  Service: Cardiovascular;  Laterality: N/A;   CARPAL TUNNEL RELEASE  05-06-12   Right   CHOLECYSTECTOMY     COLONOSCOPY WITH PROPOFOL N/A 01/26/2017   Procedure: COLONOSCOPY WITH PROPOFOL;  Surgeon: Lucilla Lame, MD;  Location: [place];  Service: Endoscopy;  Laterality: N/A;   DIAGNOSTIC LAPAROSCOPIES - MULTIPLE FOR ENDOMETRIOSIS     ESOPHAGEAL DILATION N/A 01/26/2017   Procedure: ESOPHAGEAL DILATION;  Surgeon: Lucilla Lame, MD;  Location: [place];  Service: Endoscopy;  Laterality: N/A;   ESOPHAGEAL MANOMETRY N/A 05/30/2017   Procedure: ESOPHAGEAL MANOMETRY (EM);  Surgeon: Lucilla Lame, MD;  Location: [facility];  Service: Endoscopy;  Laterality: N/A;   ESOPHAGOGASTRODUODENOSCOPY (EGD) WITH PROPOFOL N/A 01/26/2017   Procedure: ESOPHAGOGASTRODUODENOSCOPY (EGD) WITH PROPOFOL;  Surgeon: Lucilla Lame, MD;  Location: [place];  Service: Endoscopy;  Laterality: N/A;   ESOPHAGOGASTRODUODENOSCOPY (EGD) WITH PROPOFOL N/A 01/30/2020   Procedure: ESOPHAGOGASTRODUODENOSCOPY (EGD) WITH PROPOFOL;  Surgeon: Lucilla Lame, MD;  Location: [place];  Service: Endoscopy;  Laterality: N/A;  sleep apnea COVID + 01-15-20   HIP ARTHROSCOPY  09/20/2011   Procedure: ARTHROSCOPY HIP;  Surgeon: Gearlean Alf, MD;  Location: [facility];  Service: Orthopedics;  Laterality: Right;  Right Hip Scope with Labral Debridement   LEFT HEART CATH AND CORONARY ANGIOGRAPHY Left 06/10/2019   Procedure: LEFT HEART CATH AND CORONARY ANGIOGRAPHY;  Surgeon: Nelva Bush, MD;  Location: [place] [facility];  Service: Cardiovascular;  Laterality: Left;   NASAL SEPTUM SURGERY  MARCH 2013   IN [place]   [facility] IMPEDANCE STUDY N/A 05/30/2017   Procedure: [place] IMPEDANCE STUDY;  Surgeon: Lucilla Lame, MD;  Location: [facility];  Service:  Endoscopy;  Laterality: N/A;   POLYPECTOMY N/A 01/26/2017   Procedure: POLYPECTOMY;  Surgeon: Lucilla Lame, MD;  Location: [place];  Service: Endoscopy;  Laterality: N/A;   POSTERIOR CERVICAL FUSION/FORAMINOTOMY N/A 04/16/2013   Procedure: REMOVAL CERVICAL PLATES AND INTERBODY CAGE/POSTERIOR CERVICAL SPINAL FUSION C4 - C6/C5 CORPECTOMY/C4 - C6 FUSION WITH ILIAC CREST BONE GRAFT;  Surgeon: Melina Schools, MD;  Location: [place];  Service: Orthopedics;  Laterality: N/A;   RADIOFREQUENCY ABLATION NERVES     REDUCTION MAMMAPLASTY Bilateral 05/2016   RIGHT HIP ARTHROSCOPY FOR LABRAL TEAR  ABOUT 2010   2012 also   SHOULDER ARTHROSCOPY  05-06-12   bone spur   TEE WITHOUT CARDIOVERSION N/A 08/05/2020   Procedure: TRANSESOPHAGEAL ECHOCARDIOGRAM (TEE);  Surgeon: Lelon Perla, MD;  Location: [facility];  Service: Cardiovascular;  Laterality: N/A;   TONSILLECTOMY     AS A CHILD  TOTAL HIP ARTHROPLASTY Right 05/13/2012   Procedure: TOTAL HIP ARTHROPLASTY ANTERIOR APPROACH;  Surgeon: Gearlean Alf, MD;  Location: [facility];  Service: Orthopedics;  Laterality: Right;    Current Medications: Current Meds  Medication Sig   acetaminophen (TYLENOL) 500 MG tablet Take 500-1,000 mg by mouth every 6 (six) hours as needed for moderate pain.    ALPRAZolam (XANAX) 1 MG tablet Take 1 mg by mouth at bedtime.    amiodarone (PACERONE) 200 MG tablet Take 1 tablet (200 mg total) by mouth daily.   apixaban (ELIQUIS) 5 MG TABS tablet Take 1 tablet (5 mg total) by mouth 2 (two) times daily.   busPIRone (BUSPAR) 7.5 MG tablet Take 7.5 mg by mouth 2 (two) times daily.   dicyclomine (BENTYL) 20 MG tablet Take 1 tablet (20 mg total) by mouth 3 (three) times daily before meals.   LINZESS 290 MCG CAPS capsule TAKE 1 CAPSULE BY MOUTH DAILY BEFORE BREAKFAST.   Menthol, Topical Analgesic, (BENGAY EX) Apply 1 application topically daily as needed (pain).   montelukast (SINGULAIR) 10 MG tablet TAKE 1 TABLET BY MOUTH  EVERY DAY   [START ON 11/17/2020] morphine (MSIR) 15 MG tablet Take 1 tablet (15 mg total) by mouth every 6 (six) hours as needed for moderate pain or severe pain. Must last 30 days.   naphazoline-pheniramine (VISINE) 0.025-0.3 % ophthalmic solution 1 drop 4 (four) times daily as needed for eye irritation.   ondansetron (ZOFRAN ODT) 4 MG disintegrating tablet Take 1 tablet (4 mg total) by mouth every 8 (eight) hours as needed for nausea or vomiting.   pantoprazole (PROTONIX) 40 MG tablet TAKE 1 TABLET BY MOUTH TWICE A DAY   polyethylene glycol (MIRALAX / GLYCOLAX) 17 g packet Take 17 g by mouth daily.   sodium chloride (OCEAN) 0.65 % SOLN nasal spray Place 1 spray into both nostrils daily as needed for congestion ([place]).   valACYclovir (VALTREX) 1000 MG tablet TAKE TWO TABLETS BY MOUTH TWICE DAILY FOR ONE DAY FEVER BLISTER     Allergies:   Amoxicillin, Chlorhexidine gluconate, Clindamycin, Codeine, Erythromycin, Penicillin g, Sulfa antibiotics, Levofloxacin, Shellfish allergy, Decadron [dexamethasone], Mangifera indica, Papaya derivatives, Betadine [povidone iodine], Clarithromycin, Other, Povidone-iodine, and Prednisone   Social History   Socioeconomic History   Marital status: Married    Spouse name: bruce   Number of children: 2   Years of education: Not on file   Highest education level: Associate degree: occupational, Hotel manager, or vocational program  Occupational History    Comment: disabled  Tobacco Use   Smoking status: Never   Smokeless tobacco: Never  Vaping Use   Vaping Use: Never used  Substance and Sexual Activity   Alcohol use: No    Alcohol/week: 0.0 standard drinks   Drug use: Yes    Comment: prescribed morphine and xanax   Sexual activity: Yes  Other Topics Concern   Not on file  Social History Narrative   Son, daughter  And 4 grandkids; raises 3 of the grandchildren   Social Determinants of Health   Financial Resource Strain: High Risk   Difficulty of  Paying Living Expenses: Very hard  Food Insecurity: Landscape architect Present   Worried About Charity fundraiser in the Last Year: Sometimes true   Arboriculturist in the Last Year: Often true  Transportation Needs: Unmet Transportation Needs   Lack of Transportation (Medical): Yes   Lack of Transportation (Non-Medical): Yes  Physical Activity: Inactive   Days of Exercise  per Week: 0 days   Minutes of Exercise per Session: 0 min  Stress: Stress Concern Present   Feeling of Stress : Very much  Social Connections: Moderately Isolated   Frequency of Communication with Friends and Family: More than three times a week   Frequency of Social Gatherings with Friends and Family: Never   Attends Religious Services: Never   Marine scientist or Organizations: No   Attends Music therapist: Never   Marital Status: Married     Family History: The patient's family history includes Aneurysm in her maternal grandmother and mother; Anxiety disorder in her grandchild; Bipolar disorder in her grandchild and sister; Breast cancer (age of onset: 70) in her paternal grandmother; Depression in her grandchild.  ROS:   Please see the history of present illness.    All other systems reviewed and are negative.  EKGs/Labs/Other Studies Reviewed:    The following studies were reviewed today:  Ablation record  EKG:  The ekg ordered today demonstrates sinus rhythm.  Recent Labs: 05/01/2020: ALT 13 07/22/2020: BUN 9; Creatinine, Ser 0.74; Hemoglobin 11.0; Platelets 336; Potassium 3.7; Sodium 137  Recent Lipid Panel    Component Value Date/Time   CHOL 183 01/13/2020 1117   TRIG 116 01/13/2020 1117   HDL 64 01/13/2020 1117   CHOLHDL 2.9 01/13/2020 1117   LDLCALC 99 01/13/2020 1117    Physical Exam:    VS:  BP 140/86 (BP Location: Left Arm, Patient Position: Sitting, Cuff Size: Normal)   Pulse 69   Ht '5\' 4"'$  (D34-534 m)   Wt 143 lb (64.9 kg)   BMI 24.55 kg/m     Wt Readings from  Last 3 Encounters:  11/10/20 143 lb (64.9 kg)  10/01/20 142 lb (64.4 kg)  08/31/20 141 lb (64 kg)     GEN:  Well nourished, well developed in no acute distress HEENT: Normal NECK: No JVD; No carotid bruits LYMPHATICS: No lymphadenopathy CARDIAC: RRR, no murmurs, rubs, gallops RESPIRATORY:  Clear to auscultation without rales, wheezing or rhonchi  ABDOMEN: Soft, non-tender, non-distended MUSCULOSKELETAL:  No edema; No deformity  SKIN: Warm and dry NEUROLOGIC:  Alert and oriented x 3 PSYCHIATRIC:  Normal affect   ASSESSMENT:    1. Persistent atrial fibrillation ([place])   2. Typical atrial flutter (HCC)   3. Heart block AV complete (HCC)    PLAN:    In order of problems listed above:   1. Persistent atrial fibrillation ([place]) Doing well after A. fib ablation in May.  She continues to take Eliquis for stroke prophylaxis.  Given she is doing well, I think it is reasonable to discontinue amiodarone.  We discussed the possibility that she would have more atrial fibrillation in the future given the persistent nature of her atrial arrhythmia prior to the ablation.  In that case, would consider using an alternative antiarrhythmic drug to avoid the off target effects associated with amiodarone.  I would like her to continue Eliquis indefinitely.  2. Typical atrial flutter ([place]) See #1     Follow-up 9 months or sooner as needed.     Medication Adjustments/Labs and Tests Ordered: Current medicines are reviewed at length with the patient today.  Concerns regarding medicines are outlined above.  No orders of the defined types were placed in this encounter.  No orders of the defined types were placed in this encounter.    Signed, Lars Mage, MD, [facility], [facility] 11/10/2020 10:28 AM    Electrophysiology [facility]  HeartCare  

## 2020-11-10 NOTE — Patient Instructions (Signed)
Medication Instructions:   Your physician has recommended you make the following change in your medication:    STOP Amiodarone  *If you need a refill on your cardiac medications before your next appointment, please call your pharmacy*   Lab Work:  None Ordered  If you have labs (blood work) drawn today and your tests are completely normal, you will receive your results only by: [place] (if you have MyChart) OR A paper copy in the mail If you have any lab test that is abnormal or we need to change your treatment, we will call you to review the results.   Testing/Procedures:  None Ordered   Follow-Up: At [facility], you and your health needs are our priority.  As part of our continuing mission to provide you with exceptional heart care, we have created designated Provider Care Teams.  These Care Teams include your primary Cardiologist (physician) and Advanced Practice Providers (APPs -  Physician Assistants and Nurse Practitioners) who all work together to provide you with the care you need, when you need it.  We recommend signing up for the patient portal called "MyChart".  Sign up information is provided on this After Visit Summary.  MyChart is used to connect with patients for Virtual Visits (Telemedicine).  Patients are able to view lab/test results, encounter notes, upcoming appointments, etc.  Non-urgent messages can be sent to your provider as well.   To learn more about what you can do with MyChart, go to [email or web address].    Your next appointment:   9 month(s)  The format for your next appointment:   In Person  Provider:   Lars Mage, MD

## 2020-11-11 ENCOUNTER — Other Ambulatory Visit: Payer: Self-pay | Admitting: Gastroenterology

## 2020-11-12 ENCOUNTER — Ambulatory Visit (INDEPENDENT_AMBULATORY_CARE_PROVIDER_SITE_OTHER): Payer: Medicare Other

## 2020-11-12 DIAGNOSIS — I483 Typical atrial flutter: Secondary | ICD-10-CM

## 2020-11-12 DIAGNOSIS — Z9181 History of falling: Secondary | ICD-10-CM

## 2020-11-12 DIAGNOSIS — J45909 Unspecified asthma, uncomplicated: Secondary | ICD-10-CM

## 2020-11-12 DIAGNOSIS — I10 Essential (primary) hypertension: Secondary | ICD-10-CM

## 2020-11-12 NOTE — Chronic Care Management (AMB) (Signed)
Chronic Care Management   CCM RN Visit Note  11/12/2020 Name: [name] MRN: [id number] DOB: [birth year]  Subjective: [name] is a 60 y.o. year old female who is a primary care patient of Bacigalupo, Dionne Bucy, MD. The care management team was consulted for assistance with disease management and care coordination needs.    Engaged with patient by telephone for initial visit in response to provider referral for case management and care coordination services.   Consent to Services:  The patient was given the following information about Chronic Care Management services: 1. CCM service includes personalized support from designated clinical staff supervised by the primary care provider, including individualized plan of care and coordination with other care providers 2. 24/7 contact phone numbers for assistance for urgent and routine care needs. 3. Service will only be billed when office clinical staff spend 20 minutes or more in a month to coordinate care. 4. Only one practitioner may furnish and bill the service in a calendar month. 5.The patient may stop CCM services at any time (effective at the end of the month) by phone call to the office staff. 6. The patient will be responsible for cost sharing (co-pay) of up to 20% of the service fee (after annual deductible is met). Patient agreed to services and consent obtained.   Assessment: Review of patient past medical history, allergies, medications, health status, including review of consultants reports, laboratory and other test data, was performed as part of comprehensive evaluation and provision of chronic care management services.   SDOH (Social Determinants of Health) assessments and interventions performed:  SDOH Interventions    Flowsheet Row Most Recent Value  SDOH Interventions   Food Insecurity Interventions Intervention Not Indicated  Transportation Interventions Intervention Not Indicated        CCM Care  Plan  Allergies  Allergen Reactions   Amoxicillin Hives    She did ok w ANCEF   Chlorhexidine Gluconate Dermatitis and Hives   Clindamycin Hives   Codeine Hives   Erythromycin Hives    "mycins" in general   Penicillin G Hives    "cillins" in general   Sulfa Antibiotics Nausea And Vomiting and Hives        Levofloxacin Hives   Shellfish Allergy Hives   Decadron [Dexamethasone] Other (See Comments)    Hot flashes, insomnia, "manic" Hot flashes, insomnia, "manic"   Mangifera Indica Hives    papaya   Papaya Derivatives Hives   Betadine [Povidone Iodine] Hives        Clarithromycin Hives   Other Hives     Mango    Povidone-Iodine Hives   Prednisone Anxiety    High blood pressure, flushed, mood changes, heart palpitations High blood pressure, flushed, mood changes, heart palpitations    Outpatient Encounter Medications as of 11/12/2020  Medication Sig Note   acetaminophen (TYLENOL) 500 MG tablet Take 500-1,000 mg by mouth every 6 (six) hours as needed for moderate pain.     ALPRAZolam (XANAX) 1 MG tablet Take 1 mg by mouth at bedtime.     apixaban (ELIQUIS) 5 MG TABS tablet Take 1 tablet (5 mg total) by mouth 2 (two) times daily.    busPIRone (BUSPAR) 7.5 MG tablet Take 7.5 mg by mouth 2 (two) times daily.    dicyclomine (BENTYL) 20 MG tablet Take 1 tablet (20 mg total) by mouth 3 (three) times daily before meals. 11/12/2020: Reports taking as needed   LINZESS 290 MCG CAPS capsule TAKE 1 CAPSULE BY  MOUTH DAILY BEFORE BREAKFAST.    Menthol, Topical Analgesic, (BENGAY EX) Apply 1 application topically daily as needed (pain).    montelukast (SINGULAIR) 10 MG tablet TAKE 1 TABLET BY MOUTH EVERY DAY    [START ON 11/17/2020] morphine (MSIR) 15 MG tablet Take 1 tablet (15 mg total) by mouth every 6 (six) hours as needed for moderate pain or severe pain. Must last 30 days.    naphazoline-pheniramine (VISINE) 0.025-0.3 % ophthalmic solution 1 drop 4 (four) times daily as needed for eye  irritation.    pantoprazole (PROTONIX) 40 MG tablet TAKE 1 TABLET BY MOUTH TWICE A DAY    polyethylene glycol (MIRALAX / GLYCOLAX) 17 g packet Take 17 g by mouth daily.    sodium chloride (OCEAN) 0.65 % SOLN nasal spray Place 1 spray into both nostrils daily as needed for congestion (Dryness).    ondansetron (ZOFRAN ODT) 4 MG disintegrating tablet Take 1 tablet (4 mg total) by mouth every 8 (eight) hours as needed for nausea or vomiting. (Patient not taking: Reported on 11/12/2020)    valACYclovir (VALTREX) 1000 MG tablet TAKE TWO TABLETS BY MOUTH TWICE DAILY FOR ONE DAY FEVER BLISTER    No facility-administered encounter medications on file as of 11/12/2020.    Patient Active Problem List   Diagnosis Date Noted   Chronic use of opiate for therapeutic purpose 08/18/2020   Persistent atrial fibrillation ([place]) 06/30/2020   Abnormal MRI, cervical spine (05/12/2020) 06/24/2020   Chronic upper back pain 06/24/2020   Enthesopathy of shoulder (Left) 05/13/2020   Osteoarthritis of shoulder (Left) 05/13/2020   Symptoms referable to shoulder joint 05/13/2020   Tendinopathy of rotator cuff (Left) 05/13/2020   Sprain of supraspinatus muscle or tendon, sequela (Left) 05/13/2020   Subdeltoid bursitis of shoulder (Left) 05/13/2020   Subacromial bursitis of shoulder (Left) 05/13/2020   Chronic shoulder pain (Left) 05/13/2020   Acute postoperative pain 05/13/2020   History of allergy to iodine 05/13/2020    Class: History of   Abnormal MRI, shoulder (03/20/2017) 04/21/2020   Chronic anticoagulation (Eliquis) 04/21/2020   Chronic neck pain with history of cervical spinal surgery 04/21/2020   Multiple allergies 04/21/2020   S/P repair of paraesophageal hernia 02/26/2020   Heartburn    Gastritis without bleeding    Typical atrial flutter (HCC)    Foraminal stenosis of cervical region (C3-4) (Right) 08/26/2019   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) 08/26/2019   DDD (degenerative  disc disease), cervical 08/25/2019   Cervical radiculitis (C4,C5,C8) (Left) 08/20/2019   Pure hypercholesterolemia 06/27/2019   Constipation due to opioid therapy 06/27/2019   Snoring 06/27/2019   Unstable angina ([place]) 06/10/2019   Chronic musculoskeletal pain 01/14/2019   Hypertension 12/27/2018   Spondylosis without myelopathy or radiculopathy, lumbosacral region 11/12/2018   History of allergy to radiographic contrast media 11/12/2018   History of allergy to shellfish 11/12/2018   DDD (degenerative disc disease), lumbosacral 07/31/2018   Neuropathic pain 07/31/2018   Neurogenic pain 07/31/2018   Vitamin D deficiency 05/28/2018   History of lumbar surgery 05/28/2018   Groin pain, chronic, left 05/20/2018   Other intervertebral disc degeneration, lumbar region 05/20/2018   Disorder of skeletal system 05/20/2018   Problems influencing health status 05/20/2018   Overweight 12/25/2017   Enthesopathy of hip region (Left) 07/13/2017   Trigger point of shoulder region (Left) 02/26/2017   Pharmacologic therapy    Polyp of sigmoid colon    Problems with swallowing and mastication    Chronic shoulder  arthropathy (Left) 01/04/2017   Dysphagia 12/07/2016   Abnormal flushing and sweating 12/07/2016   Long term prescription benzodiazepine use 10/11/2016   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) 10/11/2016   Chronic hip pain (2ry area of Pain) (Bilateral) (L>R) 10/11/2016   Osteoarthritis of shoulder (Bilateral) (L>R) 05/17/2016   Cervicogenic headache 07/06/2015   History of total hip replacement (THR) (Right) 07/05/2015   Chronic shoulder radicular pain (Bilateral) (L>R) 07/05/2015   Lumbar facet syndrome (Bilateral) (L>R) 07/05/2015   Chronic low back pain (1ry area of Pain) (Bilateral) (L>R) w/o sciatica 04/05/2015   Chronic hip pain (Right) 01/04/2015   Encounter for therapeutic drug level monitoring 01/04/2015   Long term current use of opiate analgesic 01/04/2015    Long term prescription opiate use 01/04/2015   Opiate use 01/04/2015   Chronic pain syndrome 01/04/2015   Steroid intolerance 01/04/2015   Cervical spondylosis 01/04/2015   Cervical facet arthropathy (Bilateral) 01/04/2015   History of cervical spinal surgery 01/04/2015   Chronic shoulder pain (3ry area of Pain) (Bilateral) (L>R) 01/04/2015   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   Epidural fibrosis 01/04/2015   Lumbar spondylosis 01/04/2015   Cervical facet syndrome (Bilateral) 01/04/2015   Chronic sacroiliac joint pain (Left) 01/04/2015   DOE (dyspnea on exertion) 09/03/2014   Asthma, mild 08/18/2014   Blurred vision 08/18/2014   Benign paroxysmal positional nystagmus 08/18/2014   Clinical depression 08/18/2014   Fatigue 08/18/2014   Cannot sleep 08/18/2014   Palpitations 08/18/2014   RAD (reactive airway disease) 08/18/2014   OSA (obstructive sleep apnea) 08/18/2014   Lightheadedness 04/22/2013   Chronic neck pain 04/16/2013   Anxiety state 08/03/2003   Colon, diverticulosis 07/15/2003   IBS (irritable bowel syndrome) 04/27/2003   Esophagitis, reflux 02/11/2003   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 Patient Care Plan: Fall Risk (Adult)     Problem Identified: Fall Risk      Long-Range Goal: Absence of Fall and Fall-Related Injury   Start Date: 11/12/2020  Expected End Date: 03/12/2021  Priority: High  Note:   Fall Risk  11/12/2020 08/18/2020 04/21/2020 01/13/2020 01/07/2020  Falls in the past year? 1 0 0 1 0  Comment - - - - -  Number falls in past yr: 1 - - 1 -  Comment - - - - -  Injury with Fall? 0 - - 0 -  Comment - - - - -  Risk Factor Category  - - - - -  Comment - - - - -  Risk for fall due to : History of fall(s);Medication side effect;Impaired mobility - - Other (Comment) -  Risk for fall due to: Comment Reports drop foot - - Due to having drop foot. -   Follow up Falls prevention discussed - - - -  Comment - - - - -    Current Barriers:  Risk for Falls related to Impaired Gait  Clinical Goal(s):  Over the next 120 days, patient will not experience falls for require hospitalization for fall related injuries.  Interventions:  Collaboration with Brita Romp Dionne Bucy, MD regarding development and update of comprehensive plan of care as evidenced by provider attestation and co-signature Inter-disciplinary care team collaboration (see longitudinal plan of care) Assessed for falls.  Reviewed medications and potential side effects  Discussed safety and fall prevention measures. Advised to keep assistive device readily available for use when needed. Discussed ability to  perform ADL's and possible need for additional safety devices in the home. Performs  ADL's independently. Denies current need for additional devices or assistance in the home.   Self-Care/Patient Goals: Utilize assistive device appropriately with all ambulation Ensure pathways are clear and well it Change positions slowly Wear secure fitting non skid footwear with all ambulation Contact the clinic if functional status or ability to perform ADL's changes   Follow Up Plan:  Will follow up in two months    Patient Care Plan: Asthma (Adult)     Problem Identified: Symptom Exacerbation (Asthma)      Long-Range Goal: Symptom Exacerbation Prevented or Minimized   Start Date: 11/12/2020  Expected End Date: 03/12/2021  Priority: Medium  Note:   Current Barriers:  Chronic Disease Management support and educational needs related to Asthma.  Case Manager Clinical Goal(s): Over the next 120 days, patient will not require emergent care due to complications related to Asthma exacerbation.  Interventions:  Collaboration with Brita Romp Dionne Bucy, MD regarding development and update of comprehensive plan of care as evidenced by provider attestation and  co-signature Inter-disciplinary care team collaboration (see longitudinal plan of care) Provided verbal information regarding Asthma self care/management/and exacerbation prevention. Reports symptoms are usually triggered during the fall. Denies recent episodes of shortness of breath or symptoms related to asthma exacerbation.  Discussed current action plan and reinforced importance of daily self assessment.  Reviewed worsening symptoms that require immediate medical attention.  Provided information regarding respiratory infection prevention.  Advised to utilize  prevention strategies to reduce risk of respiratory infection.   Self-Care Activities/Patient Goals: Take medications as prescribed Assess symptoms daily  Follow recommendations to prevent asthma exacerbation and respiratory infection Notify provider or care management team with questions and new concerns as needed   Follow Up Plan:  Will follow up in two months    Patient Care Plan: Hypertension (Adult)     Problem Identified: Hypertension (Hypertension)      Long-Range Goal: Hypertension Monitored   Start Date: 11/12/2020  Expected End Date: 03/12/2021  Priority: High  Note:   Objective:  Last practice recorded BP readings:  BP Readings from Last 3 Encounters:  11/10/20 140/86  10/01/20 132/82  08/31/20 122/78   Most recent eGFR/CrCl: No results found for: EGFR  No components found for: CRCL   Current Barriers:  Chronic Disease Management support and education needs related to Hypertension.  Case Manager Clinical Goal(s):  Over the next 120 days, patient will demonstrate improved adherence to prescribed treatment plan for hypertension as evidenced by taking all medications as prescribed, monitoring and recording blood pressure and adhering to a low sodium/DASH diet.  Interventions:  Collaboration with Brita Romp Dionne Bucy, MD regarding development and update of comprehensive plan of care as evidenced by  provider attestation and co-signature Inter-disciplinary care team collaboration (see longitudinal plan of care) Reviewed medications and importance of compliance. Advised to continue taking medications as prescribed. Advised to notify provider if unable to tolerate prescribed regimen.  Provided information regarding established blood pressure parameters along with indications for notifying a provider. Reports most reading have been within range but notes a few elevated systolic reading in the  932'T and 180's. Reports that the elevated readings are usually stress induced. Reports diastolic reading have ranged in the 70's to 90's. Thoroughly discussed indications for seeking medical evaluation.  Encouraged to monitor and record readings.  Discussed compliance with recommended cardiac prudent diet and activity. Encouraged to read nutrition labels, monitor sodium intake and avoid  highly processed foods when possible. Reports ability to exercise is limited due to drop foot and history of A-Fib. Discussed complications of uncontrolled blood pressure. Reviewed s/sx of heart attack, stroke and worsening symptoms that require immediate medical attention.   Patient Goals/Self-Care Activities: Self-administer medications as prescribed Monitor and record blood pressure Adhere to recommended cardiac prudent/heart healthy diet Notify provider or care management team with questions and new concerns as needed    Follow Up Plan:  Will follow up in two months        PLAN A member of the care management team will follow up with Ms. Guthridge in two months.   Cristy [facility]lander [facility] Care Management [facility] [phone]

## 2020-11-12 NOTE — Patient Instructions (Addendum)
Thank you for allowing the Chronic Care Management team to participate in your care.    Patient Care Plan: Fall Risk (Adult)     Problem Identified: Fall Risk      Long-Range Goal: Absence of Fall and Fall-Related Injury   Start Date: 11/12/2020  Expected End Date: 03/12/2021  Priority: High  Note:   Fall Risk  11/12/2020 08/18/2020 04/21/2020 01/13/2020 01/07/2020  Falls in the past year? 1 0 0 1 0  Comment - - - - -  Number falls in past yr: 1 - - 1 -  Comment - - - - -  Injury with Fall? 0 - - 0 -  Comment - - - - -  Risk Factor Category  - - - - -  Comment - - - - -  Risk for fall due to : History of fall(s);Medication side effect;Impaired mobility - - Other (Comment) -  Risk for fall due to: Comment Reports drop foot - - Due to having drop foot. -  Follow up Falls prevention discussed - - - -  Comment - - - - -    Current Barriers:  Risk for Falls related to Impaired Gait  Clinical Goal(s):  Over the next 120 days, patient will not experience falls for require hospitalization for fall related injuries.  Interventions:  Collaboration with Brita Romp Dionne Bucy, MD regarding development and update of comprehensive plan of care as evidenced by provider attestation and co-signature Inter-disciplinary care team collaboration (see longitudinal plan of care) Assessed for falls.  Reviewed medications and potential side effects  Discussed safety and fall prevention measures. Advised to keep assistive device readily available for use when needed. Discussed ability to perform ADL's and possible need for additional safety devices in the home. Performs  ADL's independently. Denies current need for additional devices or assistance in the home.   Self-Care/Patient Goals: Utilize assistive device appropriately with all ambulation Ensure pathways are clear and well it Change positions slowly Wear secure fitting non skid footwear with all ambulation Contact the clinic if functional status or  ability to perform ADL's changes   Follow Up Plan:  Will follow up in two months    Patient Care Plan: Asthma (Adult)     Problem Identified: Symptom Exacerbation (Asthma)      Long-Range Goal: Symptom Exacerbation Prevented or Minimized   Start Date: 11/12/2020  Expected End Date: 03/12/2021  Priority: Medium  Note:   Current Barriers:  Chronic Disease Management support and educational needs related to Asthma.  Case Manager Clinical Goal(s): Over the next 120 days, patient will not require emergent care due to complications related to Asthma exacerbation.  Interventions:  Collaboration with Brita Romp Dionne Bucy, MD regarding development and update of comprehensive plan of care as evidenced by provider attestation and co-signature Inter-disciplinary care team collaboration (see longitudinal plan of care) Provided verbal information regarding Asthma self care/management/and exacerbation prevention. Reports symptoms are usually triggered during the fall. Denies recent episodes of shortness of breath or symptoms related to asthma exacerbation.  Discussed current action plan and reinforced importance of daily self assessment.  Reviewed worsening symptoms that require immediate medical attention.  Provided information regarding respiratory infection prevention.  Advised to utilize  prevention strategies to reduce risk of respiratory infection.   Self-Care Activities/Patient Goals: Take medications as prescribed Assess symptoms daily  Follow recommendations to prevent asthma exacerbation and respiratory infection Notify provider or care management team with questions and new concerns as needed   Follow Up Plan:  Will follow up in two months    Patient Care Plan: Hypertension (Adult)     Problem Identified: Hypertension (Hypertension)      Long-Range Goal: Hypertension Monitored   Start Date: 11/12/2020  Expected End Date: 03/12/2021  Priority: High  Note:   Objective:   Last practice recorded BP readings:  BP Readings from Last 3 Encounters:  11/10/20 140/86  10/01/20 132/82  08/31/20 122/78   Most recent eGFR/CrCl: No results found for: EGFR  No components found for: CRCL   Current Barriers:  Chronic Disease Management support and education needs related to Hypertension.  Case Manager Clinical Goal(s):  Over the next 120 days, patient will demonstrate improved adherence to prescribed treatment plan for hypertension as evidenced by taking all medications as prescribed, monitoring and recording blood pressure and adhering to a low sodium/DASH diet.  Interventions:  Collaboration with Brita Romp Dionne Bucy, MD regarding development and update of comprehensive plan of care as evidenced by provider attestation and co-signature Inter-disciplinary care team collaboration (see longitudinal plan of care) Reviewed medications and importance of compliance. Advised to continue taking medications as prescribed. Advised to notify provider if unable to tolerate prescribed regimen.  Provided information regarding established blood pressure parameters along with indications for notifying a provider. Reports most reading have been within range but notes a few elevated systolic reading in the  761'H and 180's. Reports that the elevated readings are usually stress induced. Reports diastolic reading have ranged in the 70's to 90's. Thoroughly discussed indications for seeking medical evaluation.  Encouraged to monitor and record readings.  Discussed compliance with recommended cardiac prudent diet and activity. Encouraged to read nutrition labels, monitor sodium intake and avoid highly processed foods when possible. Reports ability to exercise is limited due to drop foot and history of A-Fib. Discussed complications of uncontrolled blood pressure. Reviewed s/sx of heart attack, stroke and worsening symptoms that require immediate medical attention.   Patient Goals/Self-Care  Activities: Self-administer medications as prescribed Monitor and record blood pressure Adhere to recommended cardiac prudent/heart healthy diet Notify provider or care management team with questions and new concerns as needed    Follow Up Plan:  Will follow up in two months        Ms. Bruski verbalized understanding of the information discussed during the telephonic outreach. Declined need for mailed/printed instructions. A member of the care management team will follow up in two months.   Cristy [facility]lander [facility] Care Management [facility] [phone]

## 2020-11-23 ENCOUNTER — Ambulatory Visit: Payer: Medicare Other | Admitting: Family Medicine

## 2020-11-24 DIAGNOSIS — G4733 Obstructive sleep apnea (adult) (pediatric): Secondary | ICD-10-CM | POA: Diagnosis not present

## 2020-11-27 NOTE — Progress Notes (Signed)
PROVIDER NOTE: Information contained herein reflects review and annotations entered in association with encounter. Interpretation of such information and data should be left to medically-trained personnel. Information provided to patient can be located elsewhere in the medical record under "Patient Instructions". Document created using STT-dictation technology, any transcriptional errors that may result from process are unintentional.    Patient: [name]  Service Category: E/M  Provider: Gaspar Cola, MD  DOB: 06/08/1960  DOS: 11/30/2020  Specialty: Interventional Pain Management  MRN: [id number]  Setting: Ambulatory outpatient  PCP: Virginia Crews, MD  Type: Established Patient    Referring Provider: Virginia Crews, MD  Location: Office  Delivery: Face-to-face     HPI  [name], a 60 y.o. year old female, is here today because of her Chronic right shoulder pain [M25.511, G89.29]. [name] primary complain today is Neck Pain (Right side) Last encounter: My last encounter with her was on 08/18/2020. Pertinent problems: 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); Chronic hip pain (2ry area of Pain) (Bilateral) (L>R); Chronic shoulder arthropathy (Left); Trigger point of  shoulder region (Left); Enthesopathy of hip region (Left); Groin pain, chronic, left; Other intervertebral disc degeneration, lumbar region; History of lumbar surgery; DDD (degenerative disc disease), lumbosacral; Neuropathic pain; Neurogenic pain; Spondylosis without myelopathy or radiculopathy, lumbosacral region; Chronic musculoskeletal pain; Cervical radiculitis (C4,C5,C8) (Left); DDD (degenerative disc disease), cervical; Foraminal stenosis of cervical region (C3-4) (Right); 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); Abnormal MRI, shoulder (03/20/2017); Chronic neck pain with history of cervical spinal surgery; Enthesopathy of shoulder (Left); Osteoarthritis of shoulder (Left); Symptoms referable to shoulder joint; Tendinopathy of rotator cuff (Left); Sprain of supraspinatus muscle or tendon, sequela (Left); Subdeltoid bursitis of shoulder (Left); Subacromial bursitis of shoulder (Left); Chronic shoulder pain (Left); Acute postoperative pain; Abnormal MRI, cervical spine (05/12/2020); Chronic upper back pain; Chronic shoulder pain (Right); Osteoarthritis of shoulder (Right); Arthropathy of shoulder (Right); C7 radiculopathy (Right); Cervical radiculopathy at C8 (Right); Muscle weakness of upper extremity (Right); Weakness of upper extremity (Right); Cervical fusion syndrome; and Failed back syndrome of cervical spine on their pertinent problem list. Pain Assessment: Severity of Chronic pain is reported as a 4 /10. Location: Neck Right/pain radiaities down right shoulder blade , down her arm to her elbow and fingers (pinkie fingers). Onset: More than a month ago. Quality: Sharp, Stabbing, Tightness, Discomfort, Constant, Aching. Timing: Constant. Modifying factor(s): Meds, crying, ben gay. Vitals:  height is _0  (1.626 m) and weight is 140 lb (63.5 kg). Her temperature is 97.1 F (36.2 C) (abnormal). Her blood pressure is 140/99 (abnormal) and her pulse is 80. Her respiration is 15 and  oxygen saturation is 100%.   Reason for encounter: medication management.   The patient indicates doing well with the current medication regimen. No adverse reactions or side effects reported to the medications.   Today the patient comes in indicating that she is having right shoulder pain associated with  pain between her shoulder blades towards the right side and going down the right upper extremity to the pinky finger and the ring finger.  In addition to that, the patient also has an MRI of the cervical spine that was done on 05/12/2020 showing that she has a grade 1 anterolisthesis of C3 over C4 with mild right foraminal narrowing at that level secondary to spurs.  The patient also presented in the MRI with evidence of a corpectomy at the C5 with ACDF C5-C6.  Posterior fusion from C4-C6 on the right side and C5-6 on the left.  That MRI showed no significant spinal or foraminal stenosis at the C4-5 and C5-6 level.  She also has a mild disc and facet degeneration without stenosis at the C7-T1 level.  Today she presents with what seems to be symptoms affecting the C5 and C8 levels on the right side.  On 08/26/2019 I referred the patient to neurosurgery where she was seen by Dr. Deetta Perla.  On 11/19/2018 she was again referred to neurosurgery where she saw Sasaki-Adams, Alinda Deem, MD. neither one of them appear to believe that she needs any further surgery.   On 05/17/2016 she had a diagnostic bilateral suprascapular nerve block without any steroids.  On 06/19/2016 she had her second diagnostic injection, also without steroids.  Because she did seem to get some benefit from those, on 01/04/2017 she underwent her first suprascapular nerve RFA on the left side.  On 12/05/2018 she had a bilateral lumbar facet block done without any steroid, followed by a second 1 on 01/14/2019.  On 05/13/2020 she underwent a left-sided parascapular nerve RFA.  The patient does have an MRI of the right shoulder that was done on 06/12/2015.   The MRI indicated the patient to have mild to moderate diffuse supraspinatus and infraspinatus tendinitis.  Mild bursal surface fraying of the infraspinatus tendon without frank tendon tear.  It also showed mild lateral downsloping of the acromion and mild thickening of the coracoacromial ligament which could be associated with shoulder impingement.  She also presented at that time with mild chronic arthritis of the acromioclavicular joint which did not seem to contact the rotator cuff.  Finally they also indicated that she had very mild subacromial-subdeltoid bursitis.  Physical exam today demonstrated the patient to have decreased range of motion of the right shoulder upon attempting to reach the lower tip of the ipsilateral scapula.  In addition to this, the patient presented with decreased strength of the triceps muscle, wrist flexors, and finger extensors suggesting a C7/C8 radiculopathy.  DTRs were diminished bilaterally.  In view of the above results, we will be sending the patient to have a repeat MRI of the right shoulder since the last 1 was done in 2017.  We will also send her to have a nerve conduction test of the right upper extremity and today we will also enter a referral to orthopedic surgery to evaluate her shoulder problems.  It is a nerve conduction test does not show any type of pathology that could affect the shoulder, we may later consider repeating the right suprascapular nerve block and perhaps do her first radiofrequency ablation of the suprascapular nerve.  We have already done radiofrequency ablations of her left side, but not the right.  RTCB: 03/17/2021  Pharmacotherapy Assessment  Analgesic: Morphine IR 15 mg 1 tab p.o. every 6 hours (60 mg/day of morphine) MME/day: 60 mg/day.   Monitoring: [place] PMP: PDMP reviewed during this encounter.  Pharmacotherapy: No side-effects or adverse reactions reported. Compliance: No problems identified. Effectiveness: Clinically  acceptable.  Chauncey Fischer, RN  11/30/2020  2:56 PM  Sign when Signing Visit Nursing Pain Medication Assessment:  Safety precautions to be maintained throughout the outpatient stay will include: orient to surroundings, keep bed in low position, maintain call bell within reach at all times, provide assistance with transfer out of bed and ambulation.  Medication Inspection Compliance: Pill count conducted under aseptic conditions, in front of the patient. Neither the pills nor the bottle was removed from the patient's sight at any time. Once count was completed pills were immediately returned to the patient in their original bottle.  Medication: Morphine IR Pill/Patch Count:  71 of 120 pills remain Pill/Patch Appearance: Markings consistent with prescribed medication Bottle Appearance: Standard pharmacy container. Clearly labeled. Filled Date: 40 / 7 / 2022 Last Medication intake:  Today Safety precautions to be maintained throughout the outpatient stay will include: orient to surroundings, keep bed in low position, maintain call bell within reach at all times, provide assistance with transfer out of bed and ambulation.      UDS:  Summary  Date Value Ref Range Status  08/18/2020 Note  Final    Comment:    ==================================================================== [employer] [facility] 13 (MW) ==================================================================== Test                             Result       Flag       Units  Drug Present and Declared for Prescription Verification   Alprazolam                     170          EXPECTED   ng/mg creat   Alpha-hydroxyalprazolam        484          EXPECTED   ng/mg creat    Source of alprazolam is a scheduled prescription medication. Alpha-    hydroxyalprazolam is an expected metabolite of alprazolam.    Morphine                       >9524        EXPECTED   ng/mg creat   Normorphine                    757          EXPECTED   ng/mg creat     Potential sources of large amounts of morphine in the absence of    codeine include administration of morphine or use of heroin.     Normorphine is an expected metabolite of morphine.    Hydromorphone                  650          EXPECTED   ng/mg creat    Hydromorphone may be present as a metabolite of morphine;    concentrations of hydromorphone rarely exceed 5% of the morphine    concentration when this is the source of hydromorphone.  ==================================================================== Test                      Result    Flag   Units      Ref Range   Creatinine              105  mg/dL      >=20 ==================================================================== Declared Medications:  The flagging and interpretation on this report are based on the  following declared medications.  Unexpected results may arise from  inaccuracies in the declared medications.   **Note: The testing scope of this panel includes these medications:   Alprazolam (Xanax)  Morphine (MSIR)   **Note: The testing scope of this panel does not include the  following reported medications:   Acetaminophen (Tylenol)  Amiodarone  Apixaban (Eliquis)  Buspirone (Buspar)  Dicyclomine (Bentyl)  Eye Drops  Linaclotide (Linzess)  Menthol  Montelukast (Singulair)  Ondansetron (Zofran)  Pantoprazole (Protonix)  Polyethylene Glycol (MiraLAX)  Sodium Chloride  Valacyclovir (Valtrex) ==================================================================== For clinical consultation, please call [phone]. ====================================================================      ROS  Constitutional: Denies any fever or chills Gastrointestinal: No reported hemesis, hematochezia, vomiting, or acute GI distress Musculoskeletal: Denies any acute onset joint swelling, redness, loss of ROM, or weakness Neurological: No reported episodes of acute onset apraxia, aphasia, dysarthria,  agnosia, amnesia, paralysis, loss of coordination, or loss of consciousness  Medication Review  ALPRAZolam, Menthol (Topical Analgesic), acetaminophen, apixaban, busPIRone, dicyclomine, linaclotide, montelukast, morphine, naphazoline-pheniramine, pantoprazole, polyethylene glycol, sodium chloride, and valACYclovir  History Review  Allergy: [name] is allergic to amoxicillin, chlorhexidine gluconate, clindamycin, codeine, erythromycin, penicillin g, sulfa antibiotics, levofloxacin, shellfish allergy, decadron [dexamethasone], mangifera indica, papaya derivatives, betadine [povidone iodine], clarithromycin, other, povidone-iodine, and prednisone. Drug: [name]  reports current drug use. Alcohol:  reports no history of alcohol use. Tobacco:  reports that she has never smoked. She has never used smokeless tobacco. Social: [name]  reports that she has never smoked. She has never used smokeless tobacco. She reports current drug use. She reports that she does not drink alcohol. 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), Anemia, Anxiety, Asthma, Cephalalgia (08/18/2014), Cervical disc disease (08/18/2014), Chronic headaches, Chronic, continuous use of opioids, DDD (degenerative disc disease), Depression, Dissociative disorder, Dizziness (04/22/2013), Duodenal ulcer with hemorrhage and perforation ([place]) (04/27/2003), Foot drop, right, GERD (gastroesophageal reflux disease), H/O arthrodesis (08/18/2014), Headache(784.0), History of blood transfusion, History of cardiac cath, History of cardioversion, History of cervical spinal surgery (01/04/2015), History of kidney stones, Hypertension, Inverted T wave, Mitral regurgitation, Narrowing of intervertebral disc space (08/18/2014), Orthostatic hypotension (04/22/2013), Pain, Paroxysmal atrial fibrillation ([place]) (2021), Pneumonia, PONV  (postoperative nausea and vomiting), Postop Hyponatremia (05/14/2012), Postoperative anemia due to acute blood loss (05/14/2012), PTSD (post-traumatic stress disorder), Right foot drop, Right hip arthralgia (08/18/2014), Sleep apnea (2021), Therapeutic opioid-induced constipation ([facility]), and Typical atrial flutter ([place]). Surgical: Ms. Zaldivar  has a past surgical history that includes RIGHT HIP ARTHROSCOPY FOR LABRAL TEAR (ABOUT 2010); Anterior fusion cervical spine (MAY 2012); Nasal septum surgery (MARCH 2013); Back surgery (2009); Tonsillectomy; Abdominal hysterectomy; DIAGNOSTIC LAPAROSCOPIES - MULTIPLE FOR ENDOMETRIOSIS; Cholecystectomy; Hip arthroscopy (09/20/2011); Shoulder arthroscopy (05-06-12); Carpal tunnel release (05-06-12); Total hip arthroplasty (Right, 05/13/2012); Anterior cervical decomp/discectomy fusion (N/A, 10/30/2012); Posterior cervical fusion/foraminotomy (N/A, 04/16/2013); Breast reduction surgery (Bilateral, 06/2016); Colonoscopy with propofol (N/A, 01/26/2017); Esophagogastroduodenoscopy (egd) with propofol (N/A, 01/26/2017); Esophageal dilation (N/A, 01/26/2017); polypectomy (N/A, 01/26/2017); PH impedance study (N/A, 05/30/2017); Esophageal manometry (N/A, 05/30/2017); Radiofrequency ablation nerves; afib (05/2019); LEFT HEART CATH AND CORONARY ANGIOGRAPHY (Left, 06/10/2019); Cardioversion (N/A, 12/03/2019); Esophagogastroduodenoscopy (egd) with propofol (N/A, 01/30/2020); Reduction mammaplasty (Bilateral, 05/2016); Breast biopsy (Right, 2008); Breast excisional biopsy (Left, 1998); TEE without cardioversion (N/A, 08/05/2020); ATRIAL FIBRILLATION ABLATION (N/A, 08/06/2020); and Cardiac electrophysiology mapping  and ablation. Family: family history includes Aneurysm in her maternal grandmother and mother; Anxiety disorder in her grandchild; Bipolar disorder in her grandchild and sister; Breast cancer (age of onset: 31) in her paternal grandmother; Depression in her grandchild.  Laboratory Chemistry  Profile   Renal Lab Results  Component Value Date   BUN 9 07/22/2020   CREATININE 0.74 07/22/2020   BCR 7 (L) 11/11/2018   GFRAA >60 11/26/2019   GFRNONAA >60 07/22/2020    Hepatic Lab Results  Component Value Date   AST 20 05/01/2020   ALT 13 05/01/2020   ALBUMIN 4.5 05/01/2020   ALKPHOS 98 05/01/2020   LIPASE 28 05/01/2020    Electrolytes Lab Results  Component Value Date   NA 137 07/22/2020   K 3.7 07/22/2020   CL 105 07/22/2020   CALCIUM 8.9 07/22/2020   MG 1.9 11/11/2018    Bone Lab Results  Component Value Date   25OHVITD1 23 (L) 11/11/2018   25OHVITD2 9.8 11/11/2018   25OHVITD3 13 11/11/2018    Inflammation (CRP: Acute Phase) (ESR: Chronic Phase) Lab Results  Component Value Date   CRP 12 (H) 11/11/2018   ESRSEDRATE 42 (H) 11/11/2018   LATICACIDVEN 1.2 05/01/2020         Note: Above Lab results reviewed.  Recent Imaging Review  ECHO TEE    TRANSESOPHOGEAL ECHO REPORT       Patient Name:   VEENA STURGESS Date of Exam: 08/05/2020 Medical Rec #:  [id number]        Height:       64.0 in Accession #:    [id number]       Weight:       142.0 lb Date of Birth:  [birth year]        BSA:          1.691 m Patient Age:    25 years         BP:           129/109 mmHg Patient Gender: F                HR:           65 bpm. Exam Location:  Outpatient  Procedure: Transesophageal Echo, 3D Echo, Cardiac Doppler and Color Doppler  Indications:     I48.91* Unspeicified atrial fibrillation   History:         Patient has prior history of Echocardiogram examinations, most                  recent 07/22/2020. Abnormal ECG, Arrythmias:Atrial Fibrillation                  and Atrial Flutter, Signs/Symptoms:Dizziness/Lightheadedness,                  Shortness of Breath and Dyspnea; Risk Factors:Hypertension,                  Dyslipidemia and Sleep Apnea.   Sonographer:     Roseanna Rainbow RDCS Referring Phys:  [place] Diagnosing Phys: Kirk Ruths  MD  PROCEDURE: After discussion of the risks and benefits of a TEE, an informed consent was obtained from the patient. The transesophogeal probe was passed without difficulty through the esophogus of the patient. Imaged were obtained with the patient in a  left lateral decubitus position. Local oropharyngeal anesthetic was provided with Cetacaine. Sedation performed by different physician. The patient was monitored while under deep sedation. Anesthestetic sedation  was provided intravenously by  Anesthesiology: $RemoveBeforeDE'230mg'EmMBuTuECKYbUYV$  of Propofol. The patient developed no complications during the procedure.  IMPRESSIONS   1. 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  2. Left ventricular ejection fraction, by estimation, is 55 to 60%. The left ventricle has normal function.  3. Right ventricular systolic function is normal. The right ventricular size is normal.  4. Left atrial size was mildly dilated. No left atrial/left atrial appendage thrombus was detected.  5. The mitral valve is normal in structure. Mild mitral valve regurgitation.  6. The aortic valve is tricuspid. Aortic valve regurgitation is trivial.  7. There is mild (Grade II) plaque involving the descending aorta.  FINDINGS  Left Ventricle: Left ventricular ejection fraction, by estimation, is 55 to 60%. The left ventricle has normal function. The left ventricular internal cavity size was normal in size.  Right Ventricle: The right ventricular size is normal.Right ventricular systolic function is normal.  Left Atrium: Left atrial size was mildly dilated. No left atrial/left atrial appendage thrombus was detected.  Right Atrium: Right atrial size was normal in size.  Pericardium: There is no evidence of pericardial effusion.  Mitral Valve: The mitral valve is normal in structure. Mild mitral valve regurgitation.  Tricuspid Valve: The tricuspid valve is normal in structure. Tricuspid valve regurgitation is  mild.  Aortic Valve: The aortic valve is tricuspid. Aortic valve regurgitation is trivial.  Pulmonic Valve: The pulmonic valve was normal in structure. Pulmonic valve regurgitation is trivial.  Aorta: The aortic root is normal in size and structure. There is mild (Grade II) plaque involving the descending aorta.  IAS/Shunts: No atrial level shunt detected by color flow Doppler.  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    MR PISA:        1.57 cm MR PISA Radius: 0.50 cm  Kirk Ruths MD Electronically signed by Kirk Ruths MD Signature Date/Time: 08/05/2020/1:22:55 PM      Final   Note: Reviewed        Physical Exam  General appearance: Well nourished, well developed, and well hydrated. In no apparent acute distress Mental status: Alert, oriented x 3 (person, place, & time)       Respiratory: No evidence of acute respiratory distress Eyes: PERLA Vitals: BP (!) 140/99   Pulse 80   Temp (!) 97.1 F (36.2 C)   Resp 15   Ht $R'5\' 4"'XA$  (1.626 m)   Wt 140 lb (63.5 kg)   SpO2 100%   BMI 24.03 kg/m  BMI: Estimated body mass index is 24.03 kg/m as calculated from the following:   Height as of this encounter: $RemoveBeforeD'5\' 4"'TFcojEPYCIFdYp$  (1.626 m).   Weight as of this encounter: 140 lb (63.5 kg). Ideal: Ideal body weight: 54.7 kg (120 lb 9.5 oz) Adjusted ideal body weight: 58.2 kg (128 lb 5.7 oz)  Assessment   Status Diagnosis  Controlled Controlled Controlled 1. Chronic shoulder pain (Right)   2. Osteoarthritis of shoulder (Right)   3. Arthropathy of shoulder (Right)   4. 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 Abnormal MRI, shoulder (03/20/2017)   6. C7 radiculopathy (Right)   7. Cervical radiculopathy at C8 (Right)   8. Muscle weakness of upper extremity (Right)   9. Weakness of upper extremity (Right)   10. Foraminal stenosis of cervical region (C3-4) (Right)   11. 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)    12. Abnormal MRI, cervical spine (05/12/2020)   13. History of  cervical spinal surgery   14. Cervical fusion syndrome   15. Failed back syndrome of cervical spine   16. Chronic pain syndrome   17. Pharmacologic therapy   18. Chronic use of opiate for therapeutic purpose      Updated Problems: Problem  Chronic shoulder pain (Right)  Osteoarthritis of shoulder (Right)  Arthropathy of shoulder (Right)  C7 radiculopathy (Right)  Cervical radiculopathy at C8 (Right)  Muscle weakness of upper extremity (Right)  Weakness of upper extremity (Right)  Cervical Fusion Syndrome  Failed Back Syndrome of Cervical Spine    Plan of Care  Problem-specific:  No problem-specific Assessment & Plan notes found for this encounter.  [name] has a current medication list which includes the following long-term medication(s): apixaban, dicyclomine, linzess, montelukast, morphine, [START ON 12/17/2020] morphine, [START ON 01/16/2021] morphine, [START ON 02/15/2021] morphine, and pantoprazole.  Pharmacotherapy (Medications Ordered): Meds ordered this encounter  Medications   morphine (MSIR) 15 MG tablet    Sig: Take 1 tablet (15 mg total) by mouth every 6 (six) hours as needed for moderate pain or severe pain. Must last 30 days.    Dispense:  120 tablet    Refill:  0    Not a duplicate. Do NOT delete! Dispense 1 day early if closed on refill date. Avoid benzodiazepines within 8 hours of opioids. Do not send refill requests.   morphine (MSIR) 15 MG tablet    Sig: Take 1 tablet (15 mg total) by mouth every 6 (six) hours as needed for moderate pain or severe pain. Must last 30 days.    Dispense:  120 tablet    Refill:  0    Not a duplicate. Do NOT delete! Dispense 1 day early if closed on refill date. Avoid benzodiazepines within 8 hours of opioids. Do not send refill requests.   morphine (MSIR) 15 MG tablet    Sig: Take 1 tablet (15 mg total) by mouth every 6 (six) hours as needed for  moderate pain or severe pain. Must last 30 days.    Dispense:  120 tablet    Refill:  0    Not a duplicate. Do NOT delete! Dispense 1 day early if closed on refill date. Avoid benzodiazepines within 8 hours of opioids. Do not send refill requests.    Orders:  Orders Placed This Encounter  Procedures   MR SHOULDER RIGHT WO CONTRAST    Standing Status:   Future    Standing Expiration Date:   12/30/2020    Scheduling Instructions:     Imaging must be done as soon as possible. Inform patient that order will expire within 30 days and I will not renew it.    Order Specific Question:   What is the patient's sedation requirement?    Answer:   No Sedation    Order Specific Question:   Does the patient have a pacemaker or implanted devices?    Answer:   No    Order Specific Question:   Preferred imaging location?    Answer:   [facility] [place] (table limit-350lbs)    Order Specific Question:   Call Results- Best Contact Number?    Answer:   [phone] ([facility])    Order Specific Question:   Radiology Contrast Protocol - do NOT remove file path    Answer:   [email or web address]   Ambulatory referral to Orthopedic Surgery    Referral Priority:   Routine    Referral Type:   Surgical  Referral Reason:   Specialty Services Required    Requested Specialty:   Orthopedic Surgery    Number of Visits Requested:   1   NCV with EMG(electromyography)    Bilateral testing requested.    Standing Status:   Future    Standing Expiration Date:   01/30/2021    Scheduling Instructions:     Please refer this patient to  Neurology for Nerve Conduction testing of the upper extremities. (EMG & PNCV)    Order Specific Question:   Where should this test be performed?    Answer:   other    Order Specific Question:   Release to patient    Answer:   Immediate    Follow-up plan:   Return in about 4 months (around 03/17/2021) for Eval-day (M,W), (F2F), (MM).      Interventional Therapies  Risk  Complexity Considerations:   1. SHELLFISH ALLERGY; Dexamethasone & Prednisone allergy.  (NO STEROIDS)  2. NOT a candidate for lumbar facet RFA (due to hardware). 3. NOT a candidate for membrane stabilizers (Neurontin/Lyrica) due to prior failed trials. 4. NOT a candidate for SCS (due to HARDWARE). 5. NOT a candidate for IT Pump (patient's choice due to surgical risks).      Planned  Pending:   Right shoulder MRI  Right upper extremity nerve conduction test  Referral to orthopedic surgery to evaluate right shoulder pain and decreased range of motion as well asked the results of her upcoming MRI.   Under consideration:   Diagnostic right suprascapular nerve block #3 (without steroids)    Completed:   Diagnostic bilateral lumbar facet block x2 (01/14/2019) Palliative bilateral suprascapular NB x2 (No Steroids) (06/19/2016) Palliative left suprascapular nerve RFA x1 (01/04/2017)   Therapeutic  Palliative (PRN) options:   Diagnostic bilateral lumbar facet block #2  Palliative bilateral suprascapular NB #3 (No Steroids) Palliative left suprascapular nerve RFA #2          Recent Visits Date Type Provider Dept  10/27/20 Telemedicine Milinda Pointer, MD [facility]  Showing recent visits within past 90 days and meeting all other requirements Today's Visits Date Type Provider Dept  11/30/20 Office Visit Milinda Pointer, MD [facility]  Showing today's visits and meeting all other requirements Future Appointments No visits were found meeting these conditions. Showing future appointments within next 90 days and meeting all other requirements I discussed the assessment and treatment plan with the patient. The patient was provided an opportunity to ask questions and all were answered. The patient agreed with the plan and demonstrated an understanding of the instructions.  Patient advised to call back or seek an in-person  evaluation if the symptoms or condition worsens.  Duration of encounter: 30 minutes.  Note by: Gaspar Cola, MD Date: 11/30/2020; Time: 3:59 PM

## 2020-11-30 ENCOUNTER — Ambulatory Visit: Payer: Medicare Other | Attending: Pain Medicine | Admitting: Pain Medicine

## 2020-11-30 ENCOUNTER — Other Ambulatory Visit: Payer: Self-pay

## 2020-11-30 ENCOUNTER — Encounter: Payer: Self-pay | Admitting: Pain Medicine

## 2020-11-30 VITALS — BP 140/99 | HR 80 | Temp 97.1°F | Resp 15 | Ht 64.0 in | Wt 140.0 lb

## 2020-11-30 DIAGNOSIS — Q761 Klippel-Feil syndrome: Secondary | ICD-10-CM | POA: Insufficient documentation

## 2020-11-30 DIAGNOSIS — R937 Abnormal findings on diagnostic imaging of other parts of musculoskeletal system: Secondary | ICD-10-CM | POA: Insufficient documentation

## 2020-11-30 DIAGNOSIS — M19012 Primary osteoarthritis, left shoulder: Secondary | ICD-10-CM | POA: Insufficient documentation

## 2020-11-30 DIAGNOSIS — Z79891 Long term (current) use of opiate analgesic: Secondary | ICD-10-CM | POA: Diagnosis not present

## 2020-11-30 DIAGNOSIS — G894 Chronic pain syndrome: Secondary | ICD-10-CM | POA: Insufficient documentation

## 2020-11-30 DIAGNOSIS — M961 Postlaminectomy syndrome, not elsewhere classified: Secondary | ICD-10-CM | POA: Insufficient documentation

## 2020-11-30 DIAGNOSIS — Z9889 Other specified postprocedural states: Secondary | ICD-10-CM | POA: Diagnosis not present

## 2020-11-30 DIAGNOSIS — M5412 Radiculopathy, cervical region: Secondary | ICD-10-CM | POA: Insufficient documentation

## 2020-11-30 DIAGNOSIS — M4802 Spinal stenosis, cervical region: Secondary | ICD-10-CM | POA: Insufficient documentation

## 2020-11-30 DIAGNOSIS — R936 Abnormal findings on diagnostic imaging of limbs: Secondary | ICD-10-CM | POA: Insufficient documentation

## 2020-11-30 DIAGNOSIS — M25511 Pain in right shoulder: Secondary | ICD-10-CM | POA: Insufficient documentation

## 2020-11-30 DIAGNOSIS — Z79899 Other long term (current) drug therapy: Secondary | ICD-10-CM | POA: Diagnosis not present

## 2020-11-30 DIAGNOSIS — M6281 Muscle weakness (generalized): Secondary | ICD-10-CM | POA: Diagnosis not present

## 2020-11-30 DIAGNOSIS — G8929 Other chronic pain: Secondary | ICD-10-CM | POA: Diagnosis not present

## 2020-11-30 DIAGNOSIS — R29898 Other symptoms and signs involving the musculoskeletal system: Secondary | ICD-10-CM | POA: Insufficient documentation

## 2020-11-30 DIAGNOSIS — M19011 Primary osteoarthritis, right shoulder: Secondary | ICD-10-CM | POA: Insufficient documentation

## 2020-11-30 MED ORDER — MORPHINE SULFATE 15 MG PO TABS: 15.0000 mg | ORAL_TABLET | Freq: Four times a day (QID) | ORAL | 0 refills | Status: DC | PRN

## 2020-11-30 NOTE — Progress Notes (Signed)
Nursing Pain Medication Assessment:  Safety precautions to be maintained throughout the outpatient stay will include: orient to surroundings, keep bed in low position, maintain call bell within reach at all times, provide assistance with transfer out of bed and ambulation.  Medication Inspection Compliance: Pill count conducted under aseptic conditions, in front of the patient. Neither the pills nor the bottle was removed from the patient's sight at any time. Once count was completed pills were immediately returned to the patient in their original bottle.  Medication: Morphine IR Pill/Patch Count:  71 of 120 pills remain Pill/Patch Appearance: Markings consistent with prescribed medication Bottle Appearance: Standard pharmacy container. Clearly labeled. Filled Date: 53 / 7 / 2022 Last Medication intake:  Today Safety precautions to be maintained throughout the outpatient stay will include: orient to surroundings, keep bed in low position, maintain call bell within reach at all times, provide assistance with transfer out of bed and ambulation.

## 2020-11-30 NOTE — Patient Instructions (Signed)
____________________________________________________________________________________________  Medication Rules  Purpose: To inform patients, and their family members, of our rules and regulations.  Applies to: All patients receiving prescriptions (written or electronic).  Pharmacy of record: Pharmacy where electronic prescriptions will be sent. If written prescriptions are taken to a different pharmacy, please inform the nursing staff. The pharmacy listed in the electronic medical record should be the one where you would like electronic prescriptions to be sent.  Electronic prescriptions: In compliance with the  Strengthen Opioid Misuse Prevention (STOP) Act of 2017 (Session Law 2017-74/H243), effective March 13, 2018, all controlled substances must be electronically prescribed. Calling prescriptions to the pharmacy will cease to exist.  Prescription refills: Only during scheduled appointments. Applies to all prescriptions.  NOTE: The following applies primarily to controlled substances (Opioid* Pain Medications).   Type of encounter (visit): For patients receiving controlled substances, face-to-face visits are required. (Not an option or up to the patient.)  Patient's responsibilities: Pain Pills: Bring all pain pills to every appointment (except for procedure appointments). Pill Bottles: Bring pills in original pharmacy bottle. Always bring the newest bottle. Bring bottle, even if empty. Medication refills: You are responsible for knowing and keeping track of what medications you take and those you need refilled. The day before your appointment: write a list of all prescriptions that need to be refilled. The day of the appointment: give the list to the admitting nurse. Prescriptions will be written only during appointments. No prescriptions will be written on procedure days. If you forget a medication: it will not be "Called in", "Faxed", or "electronically sent". You will  need to get another appointment to get these prescribed. No early refills. Do not call asking to have your prescription filled early. Prescription Accuracy: You are responsible for carefully inspecting your prescriptions before leaving our office. Have the discharge nurse carefully go over each prescription with you, before taking them home. Make sure that your name is accurately spelled, that your address is correct. Check the name and dose of your medication to make sure it is accurate. Check the number of pills, and the written instructions to make sure they are clear and accurate. Make sure that you are given enough medication to last until your next medication refill appointment. Taking Medication: Take medication as prescribed. When it comes to controlled substances, taking less pills or less frequently than prescribed is permitted and encouraged. Never take more pills than instructed. Never take medication more frequently than prescribed.  Inform other Doctors: Always inform, all of your healthcare providers, of all the medications you take. Pain Medication from other Providers: You are not allowed to accept any additional pain medication from any other Doctor or Healthcare provider. There are two exceptions to this rule. (see below) In the event that you require additional pain medication, you are responsible for notifying us, as stated below. Cough Medicine: Often these contain an opioid, such as codeine or hydrocodone. Never accept or take cough medicine containing these opioids if you are already taking an opioid* medication. The combination may cause respiratory failure and death. Medication Agreement: You are responsible for carefully reading and following our Medication Agreement. This must be signed before receiving any prescriptions from our practice. Safely store a copy of your signed Agreement. Violations to the Agreement will result in no further prescriptions. (Additional copies of our  Medication Agreement are available upon request.) Laws, Rules, & Regulations: All patients are expected to follow all Federal and State Laws, Statutes, Rules, & Regulations. Ignorance of   the Laws does not constitute a valid excuse.  Illegal drugs and Controlled Substances: The use of illegal substances (including, but not limited to marijuana and its derivatives) and/or the illegal use of any controlled substances is strictly prohibited. Violation of this rule may result in the immediate and permanent discontinuation of any and all prescriptions being written by our practice. The use of any illegal substances is prohibited. Adopted CDC guidelines & recommendations: Target dosing levels will be at or below 60 MME/day. Use of benzodiazepines** is not recommended.  Exceptions: There are only two exceptions to the rule of not receiving pain medications from other Healthcare Providers. Exception #1 (Emergencies): In the event of an emergency (i.e.: accident requiring emergency care), you are allowed to receive additional pain medication. However, you are responsible for: As soon as you are able, call our office [phone], at any time of the day or night, and leave a message stating your name, the date and nature of the emergency, and the name and dose of the medication prescribed. In the event that your call is answered by a member of our staff, make sure to document and save the date, time, and the name of the person that took your information.  Exception #2 (Planned Surgery): In the event that you are scheduled by another doctor or dentist to have any type of surgery or procedure, you are allowed (for a period no longer than 30 days), to receive additional pain medication, for the acute post-op pain. However, in this case, you are responsible for picking up a copy of our "Post-op Pain Management for Surgeons" handout, and giving it to your surgeon or dentist. This document is available at our office, and  does not require an appointment to obtain it. Simply go to our office during business hours (Monday-Thursday from 8:00 AM to 4:00 PM) (Friday 8:00 AM to 12:00 Noon) or if you have a scheduled appointment with us, prior to your surgery, and ask for it by name. In addition, you are responsible for: calling our office [phone], at any time of the day or night, and leaving a message stating your name, name of your surgeon, type of surgery, and date of procedure or surgery. Failure to comply with your responsibilities may result in termination of therapy involving the controlled substances.  *Opioid medications include: morphine, codeine, oxycodone, oxymorphone, hydrocodone, hydromorphone, meperidine, tramadol, tapentadol, buprenorphine, fentanyl, methadone. **Benzodiazepine medications include: diazepam (Valium), alprazolam (Xanax), clonazepam (Klonopine), lorazepam (Ativan), clorazepate (Tranxene), chlordiazepoxide (Librium), estazolam (Prosom), oxazepam (Serax), temazepam (Restoril), triazolam (Halcion) (Last updated: 02/09/2020) ____________________________________________________________________________________________  ____________________________________________________________________________________________  Medication Recommendations and Reminders  Applies to: All patients receiving prescriptions (written and/or electronic).  Medication Rules & Regulations: These rules and regulations exist for your safety and that of others. They are not flexible and neither are we. Dismissing or ignoring them will be considered "non-compliance" with medication therapy, resulting in complete and irreversible termination of such therapy. (See document titled "Medication Rules" for more details.) In all conscience, because of safety reasons, we cannot continue providing a therapy where the patient does not follow instructions.  Pharmacy of record:  Definition: This is the pharmacy where your electronic  prescriptions will be sent.  We do not endorse any particular pharmacy, however, we have experienced problems with [facility] not securing enough medication supply for the community. We do not restrict you in your choice of pharmacy. However, once we write for your prescriptions, we will NOT be re-sending more prescriptions to fix restricted supply problems   created by your pharmacy, or your insurance.  The pharmacy listed in the electronic medical record should be the one where you want electronic prescriptions to be sent. If you choose to change pharmacy, simply notify our nursing staff.  Recommendations: Keep all of your pain medications in a safe place, under lock and key, even if you live alone. We will NOT replace lost, stolen, or damaged medication. After you fill your prescription, take 1 week's worth of pills and put them away in a safe place. You should keep a separate, properly labeled bottle for this purpose. The remainder should be kept in the original bottle. Use this as your primary supply, until it runs out. Once it's gone, then you know that you have 1 week's worth of medicine, and it is time to come in for a prescription refill. If you do this correctly, it is unlikely that you will ever run out of medicine. To make sure that the above recommendation works, it is very important that you make sure your medication refill appointments are scheduled at least 1 week before you run out of medicine. To do this in an effective manner, make sure that you do not leave the office without scheduling your next medication management appointment. Always ask the nursing staff to show you in your prescription , when your medication will be running out. Then arrange for the receptionist to get you a return appointment, at least 7 days before you run out of medicine. Do not wait until you have 1 or 2 pills left, to come in. This is very poor planning and does not take into consideration that we may need to  cancel appointments due to bad weather, sickness, or emergencies affecting our staff. DO NOT ACCEPT A "Partial Fill": If for any reason your pharmacy does not have enough pills/tablets to completely fill or refill your prescription, do not allow for a "partial fill". The law allows the pharmacy to complete that prescription within 72 hours, without requiring a new prescription. If they do not fill the rest of your prescription within those 72 hours, you will need a separate prescription to fill the remaining amount, which we will NOT provide. If the reason for the partial fill is your insurance, you will need to talk to the pharmacist about payment alternatives for the remaining tablets, but again, DO NOT ACCEPT A PARTIAL FILL, unless you can trust your pharmacist to obtain the remainder of the pills within 72 hours.  Prescription refills and/or changes in medication(s):  Prescription refills, and/or changes in dose or medication, will be conducted only during scheduled medication management appointments. (Applies to both, written and electronic prescriptions.) No refills on procedure days. No medication will be changed or started on procedure days. No changes, adjustments, and/or refills will be conducted on a procedure day. Doing so will interfere with the diagnostic portion of the procedure. No phone refills. No medications will be "called into the pharmacy". No Fax refills. No weekend refills. No Holliday refills. No after hours refills.  Remember:  Business hours are:  Monday to Thursday 8:00 AM to 4:00 PM Provider's Schedule: Shayann Garbutt, MD - Appointments are:  Medication management: Monday and Wednesday 8:00 AM to 4:00 PM Procedure day: Tuesday and Thursday 7:30 AM to 4:00 PM Bilal Lateef, MD - Appointments are:  Medication management: Tuesday and Thursday 8:00 AM to 4:00 PM Procedure day: Monday and Wednesday 7:30 AM to 4:00 PM (Last update:  10/01/2019) ____________________________________________________________________________________________  ____________________________________________________________________________________________  CBD (cannabidiol) WARNING    Applicable to: All individuals currently taking or considering taking CBD (cannabidiol) and, more important, all patients taking opioid analgesic controlled substances (pain medication). (Example: oxycodone; oxymorphone; hydrocodone; hydromorphone; morphine; methadone; tramadol; tapentadol; fentanyl; buprenorphine; butorphanol; dextromethorphan; meperidine; codeine; etc.)  Legal status: CBD remains a Schedule I drug prohibited for any use. CBD is illegal with one exception. In the [place], CBD has a limited Food and Drug Administration (FDA) approval for the treatment of two specific types of epilepsy disorders. Only one CBD product has been approved by the FDA for this purpose: "Epidiolex". FDA is aware that some companies are marketing products containing cannabis and cannabis-derived compounds in ways that violate the Federal Food, Drug and Cosmetic Act (FD&C Act) and that may put the health and safety of consumers at risk. The FDA, a Federal agency, has not enforced the CBD status since 2018.   Legality: Some manufacturers ship CBD products nationally, which is illegal. Often such products are sold online and are therefore available throughout the country. CBD is openly sold in head shops and health food stores in some states where such sales have not been explicitly legalized. Selling unapproved products with unsubstantiated therapeutic claims is not only a violation of the law, but also can put patients at risk, as these products have not been proven to be safe or effective. Federal illegality makes it difficult to conduct research on CBD.  Reference: "FDA Regulation of Cannabis and Cannabis-Derived Products, Including Cannabidiol (CBD)" -  https://www.fda.gov/news-events/public-health-focus/fda-regulation-cannabis-and-cannabis-derived-products-including-cannabidiol-cbd  Warning: CBD is not FDA approved and has not undergo the same manufacturing controls as prescription drugs.  This means that the purity and safety of available CBD may be questionable. Most of the time, despite manufacturer's claims, it is contaminated with THC (delta-9-tetrahydrocannabinol - the chemical in marijuana responsible for the "HIGH").  When this is the case, the THC contaminant will trigger a positive urine drug screen (UDS) test for Marijuana (carboxy-THC). Because a positive UDS for any illicit substance is a violation of our medication agreement, your opioid analgesics (pain medicine) may be permanently discontinued.  MORE ABOUT CBD  General Information: CBD  is a derivative of the Marijuana (cannabis sativa) plant discovered in 1940. It is one of the 113 identified substances found in Marijuana. It accounts for up to 40% of the plant's extract. As of 2018, preliminary clinical studies on CBD included research for the treatment of anxiety, movement disorders, and pain. CBD is available and consumed in multiple forms, including inhalation of smoke or vapor, as an aerosol spray, and by mouth. It may be supplied as an oil containing CBD, capsules, dried cannabis, or as a liquid solution. CBD is thought not to be as psychoactive as THC (delta-9-tetrahydrocannabinol - the chemical in marijuana responsible for the "HIGH"). Studies suggest that CBD may interact with different biological target receptors in the body, including cannabinoid and other neurotransmitter receptors. As of 2018 the mechanism of action for its biological effects has not been determined.  Side-effects  Adverse reactions: Dry mouth, diarrhea, decreased appetite, fatigue, drowsiness, malaise, weakness, sleep disturbances, and others.  Drug interactions: CBC may interact with other medications  such as blood-thinners. (Last update: 10/18/2019) ____________________________________________________________________________________________  ____________________________________________________________________________________________  Drug Holidays (Slow)  What is a "Drug Holiday"? Drug Holiday: is the name given to the period of time during which a patient stops taking a medication(s) for the purpose of eliminating tolerance to the drug.  Benefits Improved effectiveness of opioids. Decreased opioid dose needed to achieve benefits. Improved pain with lesser dose.    What is tolerance? Tolerance: is the progressive decreased in effectiveness of a drug due to its repetitive use. With repetitive use, the body gets use to the medication and as a consequence, it loses its effectiveness. This is a common problem seen with opioid pain medications. As a result, a larger dose of the drug is needed to achieve the same effect that used to be obtained with a smaller dose.  How long should a "Drug Holiday" last? You should stay off of the pain medicine for at least 14 consecutive days. (2 weeks)  Should I stop the medicine "cold turkey"? No. You should always coordinate with your Pain Specialist so that he/she can provide you with the correct medication dose to make the transition as smoothly as possible.  How do I stop the medicine? Slowly. You will be instructed to decrease the daily amount of pills that you take by one (1) pill every seven (7) days. This is called a "slow downward taper" of your dose. For example: if you normally take four (4) pills per day, you will be asked to drop this dose to three (3) pills per day for seven (7) days, then to two (2) pills per day for seven (7) days, then to one (1) per day for seven (7) days, and at the end of those last seven (7) days, this is when the "Drug Holiday" would start.   Will I have withdrawals? By doing a "slow downward taper" like this one, it  is unlikely that you will experience any significant withdrawal symptoms. Typically, what triggers withdrawals is the sudden stop of a high dose opioid therapy. Withdrawals can usually be avoided by slowly decreasing the dose over a prolonged period of time. If you do not follow these instructions and decide to stop your medication abruptly, withdrawals may be possible.  What are withdrawals? Withdrawals: refers to the wide range of symptoms that occur after stopping or dramatically reducing opiate drugs after heavy and prolonged use. Withdrawal symptoms do not occur to patients that use low dose opioids, or those who take the medication sporadically. Contrary to benzodiazepine (example: Valium, Xanax, etc.) or alcohol withdrawals ("Delirium Tremens"), opioid withdrawals are not lethal. Withdrawals are the physical manifestation of the body getting rid of the excess receptors.  Expected Symptoms Early symptoms of withdrawal may include: Agitation Anxiety Muscle aches Increased tearing Insomnia Runny nose Sweating Yawning  Late symptoms of withdrawal may include: Abdominal cramping Diarrhea Dilated pupils Goose bumps Nausea Vomiting  Will I experience withdrawals? Due to the slow nature of the taper, it is very unlikely that you will experience any.  What is a slow taper? Taper: refers to the gradual decrease in dose.  (Last update: 10/01/2019) ____________________________________________________________________________________________    

## 2020-12-01 ENCOUNTER — Encounter: Payer: Self-pay | Admitting: Neurology

## 2020-12-08 ENCOUNTER — Ambulatory Visit (INDEPENDENT_AMBULATORY_CARE_PROVIDER_SITE_OTHER): Payer: Medicare Other | Admitting: Neurology

## 2020-12-08 ENCOUNTER — Other Ambulatory Visit: Payer: Self-pay

## 2020-12-08 DIAGNOSIS — M961 Postlaminectomy syndrome, not elsewhere classified: Secondary | ICD-10-CM

## 2020-12-08 DIAGNOSIS — M4802 Spinal stenosis, cervical region: Secondary | ICD-10-CM

## 2020-12-08 DIAGNOSIS — M5412 Radiculopathy, cervical region: Secondary | ICD-10-CM

## 2020-12-08 DIAGNOSIS — Q761 Klippel-Feil syndrome: Secondary | ICD-10-CM

## 2020-12-08 DIAGNOSIS — Z9889 Other specified postprocedural states: Secondary | ICD-10-CM

## 2020-12-08 DIAGNOSIS — R937 Abnormal findings on diagnostic imaging of other parts of musculoskeletal system: Secondary | ICD-10-CM

## 2020-12-08 DIAGNOSIS — M6281 Muscle weakness (generalized): Secondary | ICD-10-CM

## 2020-12-08 DIAGNOSIS — R29898 Other symptoms and signs involving the musculoskeletal system: Secondary | ICD-10-CM

## 2020-12-08 NOTE — Procedures (Signed)
[facility]  [place], [place]  [place], [place] [address] Tel: [phone] Fax:  [phone] Test Date:  12/08/2020  Patient: [name] DOB: Dec 09, 1960 Physician: Narda Amber, DO  Sex: Female Height: 5\' 4"  Ref Phys: Louretta Parma, MD  ID#: [id number]   Technician:    Patient Complaints: This is a 60 year old female referred for evaluation of right arm pain and paresthesias.  NCV & EMG Findings: Extensive electrodiagnostic testing of the right upper extremity shows: Right median, ulnar, and mixed palmar sensory responses are within normal limits. Right median and ulnar motor responses are within normal limits. There is no evidence of active or chronic motor axonal loss changes affecting any of the tested muscles.  Motor unit configuration and recruitment pattern is within normal limits.  Impression: This is a normal study of the right upper extremity.  In particular, there is no evidence of carpal tunnel syndrome or cervical radiculopathy.   ___________________________ Narda Amber, DO    Nerve Conduction Studies Anti Sensory Summary Table   Stim Site NR Peak (ms) Norm Peak (ms) P-T Amp (V) Norm P-T Amp  Right Median Anti Sensory (2nd Digit)  33C  Wrist    2.8 <3.8 21.1 >10  Right Ulnar Anti Sensory (5th Digit)  33C  Wrist    2.4 <3.2 19.7 >5   Motor Summary Table   Stim Site NR Onset (ms) Norm Onset (ms) O-P Amp (mV) Norm O-P Amp Site1 Site2 Delta-0 (ms) Dist (cm) Vel (m/s) Norm Vel (m/s)  Right Median Motor (Abd Poll Brev)  33C  Wrist    2.7 <4.0 9.1 >5 Elbow Wrist 4.9 28.0 57 >50  Elbow    7.6  9.1         Right Ulnar Motor (Abd Dig Minimi)  33C  Wrist    3.0 <3.1 8.0 >7 B Elbow Wrist 3.4 20.0 59 >50  B Elbow    6.4  7.2  A Elbow B Elbow 1.7 10.0 59 >50  A Elbow    8.1  6.7          Comparison Summary Table   Stim Site NR Peak (ms) Norm Peak (ms) P-T Amp (V) Site1 Site2 Delta-P (ms) Norm Delta (ms)  Right Median/Ulnar Palm  Comparison (Wrist - 8cm)  33C  Median Palm    1.6 <2.2 25.5 Median Palm Ulnar Palm 0.0   Ulnar Palm    1.6 <2.2 20.1       EMG   Side Muscle Ins Act Fibs Psw Fasc Number Recrt Dur Dur. Amp Amp. Poly Poly. Comment  Right 1stDorInt Nml Nml Nml Nml Nml Nml Nml Nml Nml Nml Nml Nml N/A  Right Ext Indicis Nml Nml Nml Nml Nml Nml Nml Nml Nml Nml Nml Nml N/A  Right PronatorTeres Nml Nml Nml Nml Nml Nml Nml Nml Nml Nml Nml Nml N/A  Right Biceps Nml Nml Nml Nml Nml Nml Nml Nml Nml Nml Nml Nml N/A  Right Triceps Nml Nml Nml Nml Nml Nml Nml Nml Nml Nml Nml Nml N/A  Right Deltoid Nml Nml Nml Nml Nml Nml Nml Nml Nml Nml Nml Nml N/A      Waveforms:

## 2020-12-09 ENCOUNTER — Ambulatory Visit
Admission: RE | Admit: 2020-12-09 | Discharge: 2020-12-09 | Disposition: A | Discharge status: Home / Self Care | Payer: Medicare Other | Source: Ambulatory Visit | Attending: Pain Medicine | Admitting: Pain Medicine

## 2020-12-09 ENCOUNTER — Other Ambulatory Visit: Payer: Self-pay

## 2020-12-09 DIAGNOSIS — M19012 Primary osteoarthritis, left shoulder: Secondary | ICD-10-CM | POA: Diagnosis not present

## 2020-12-09 DIAGNOSIS — G8929 Other chronic pain: Secondary | ICD-10-CM

## 2020-12-09 DIAGNOSIS — M25511 Pain in right shoulder: Secondary | ICD-10-CM | POA: Insufficient documentation

## 2020-12-09 DIAGNOSIS — R936 Abnormal findings on diagnostic imaging of limbs: Secondary | ICD-10-CM

## 2020-12-09 DIAGNOSIS — M19011 Primary osteoarthritis, right shoulder: Secondary | ICD-10-CM

## 2020-12-10 DIAGNOSIS — I10 Essential (primary) hypertension: Secondary | ICD-10-CM | POA: Diagnosis not present

## 2020-12-10 DIAGNOSIS — J45909 Unspecified asthma, uncomplicated: Secondary | ICD-10-CM

## 2020-12-15 NOTE — Progress Notes (Signed)
Patient: [name]  Service Category: E/M  Provider: Gaspar Cola, MD  DOB: 27-Feb-1961  DOS: 12/16/2020  Location: Office  MRN: [id number]  Setting: Ambulatory outpatient  Referring Provider: Virginia Crews, MD  Type: Established Patient  Specialty: Interventional Pain Management  PCP: Virginia Crews, MD  Location: Remote location  Delivery: TeleHealth     Virtual Encounter - Pain Management PROVIDER NOTE: Information contained herein reflects review and annotations entered in association with encounter. Interpretation of such information and data should be left to medically-trained personnel. Information provided to patient can be located elsewhere in the medical record under "Patient Instructions". Document created using STT-dictation technology, any transcriptional errors that may result from process are unintentional.    Contact & Pharmacy Preferred: [phone] Home: [phone] (home) Mobile: [phone] (mobile) E-mail: No e-mail address on record  [id number] [id number]: [phone]: [phone]  Pre-screening  Ms. HDelanceyoffered "in-person" vs "virtual" encounter. She indicated preferring virtual for this encounter.   Reason COVID-19*  Social distancing based on CDC and AMA recommendations.   I contacted Shelby Skeneon 12/16/2020 via telephone.      I clearly identified myself as FGaspar Cola MD. I verified that I was speaking with the correct person using two identifiers (Name: [name] and date of birth: 330-Apr-1962.  Consent I sought verbal advanced consent from [name] virtual visit interactions. I informed Ms. HBattagliaof possible security and privacy concerns, risks, and limitations associated with providing "not-in-person" medical evaluation and management services. I also informed Ms. HVicarioof the availability of "in-person" appointments.  Finally, I informed her that there would be a charge for the virtual visit and that she could be  personally, fully or partially, financially responsible for it. Ms. HFedieexpressed understanding and agreed to proceed.   Historic Elements   Ms. RTALON WITTINGis a 60y.o. year old, female patient evaluated today after our last contact on 11/30/2020. 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), Anemia, Anxiety, Asthma, Cephalalgia (08/18/2014), Cervical disc disease (08/18/2014), Chronic headaches, Chronic, continuous use of opioids, DDD (degenerative disc disease), Depression, Dissociative disorder, Dizziness (04/22/2013), Duodenal ulcer with hemorrhage and perforation ([place] (04/27/2003), Foot drop, right, GERD (gastroesophageal reflux disease), H/O arthrodesis (08/18/2014), Headache(784.0), History of blood transfusion, History of cardiac cath, History of cardioversion, History of cervical spinal surgery (01/04/2015), History of kidney stones, Hypertension, Inverted T wave, Mitral regurgitation, Narrowing of intervertebral disc space (08/18/2014), Orthostatic hypotension (04/22/2013), Pain, Paroxysmal atrial fibrillation ([place] (2021), Pneumonia, PONV (postoperative nausea and vomiting), Postop Hyponatremia (05/14/2012), Postoperative anemia due to acute blood loss (05/14/2012), PTSD (post-traumatic stress disorder), Right foot drop, Right hip arthralgia (08/18/2014), Sleep apnea (2021), Therapeutic opioid-induced constipation (O[facility]), and Typical atrial flutter ([place]. She also  has a past surgical history that includes RIGHT HIP ARTHROSCOPY FOR LABRAL TEAR (ABOUT 2010); Anterior fusion cervical spine (MAY 2012); Nasal septum surgery (MARCH 2013); Back surgery (2009); Tonsillectomy; Abdominal hysterectomy; DIAGNOSTIC LAPAROSCOPIES - MULTIPLE FOR ENDOMETRIOSIS; Cholecystectomy; Hip arthroscopy  (09/20/2011); Shoulder arthroscopy (05-06-12); Carpal tunnel release (05-06-12); Total hip arthroplasty (Right, 05/13/2012); Anterior cervical decomp/discectomy fusion (N/A, 10/30/2012); Posterior cervical fusion/foraminotomy (N/A, 04/16/2013); Breast reduction surgery (Bilateral, 06/2016); Colonoscopy with propofol (N/A, 01/26/2017); Esophagogastroduodenoscopy (egd) with propofol (N/A, 01/26/2017); Esophageal dilation (N/A, 01/26/2017); polypectomy (N/A, 01/26/2017); PH impedance study (N/A,  05/30/2017); Esophageal manometry (N/A, 05/30/2017); Radiofrequency ablation nerves; afib (05/2019); LEFT HEART CATH AND CORONARY ANGIOGRAPHY (Left, 06/10/2019); Cardioversion (N/A, 12/03/2019); Esophagogastroduodenoscopy (egd) with propofol (N/A, 01/30/2020); Reduction mammaplasty (Bilateral, 05/2016); Breast biopsy (Right, 2008); Breast excisional biopsy (Left, 1998); TEE without cardioversion (N/A, 08/05/2020); ATRIAL FIBRILLATION ABLATION (N/A, 08/06/2020); and Cardiac electrophysiology mapping and ablation. [name] has a current medication list which includes the following prescription(s): acetaminophen, alprazolam, apixaban, buspirone, dicyclomine, linzess, menthol (topical analgesic), montelukast, morphine, [START ON 12/17/2020] morphine, [START ON 01/16/2021] morphine, [START ON 02/15/2021] morphine, visine, pantoprazole, polyethylene glycol, sodium chloride, and valacyclovir. She  reports that she has never smoked. She has never used smokeless tobacco. She reports current drug use. She reports that she does not drink alcohol. [name] is allergic to amoxicillin, chlorhexidine gluconate, clindamycin, codeine, erythromycin, penicillin g, sulfa antibiotics, levofloxacin, shellfish allergy, decadron [dexamethasone], mangifera indica, papaya derivatives, betadine [povidone iodine], clarithromycin, other, povidone-iodine, and prednisone.   HPI  Today, she is being contacted for follow-up evaluation after her right shoulder MRI  (12/09/2020).  According to the RIGHT SHOULDER MRI impression: 1. Subscapularis muscle strain. 2. Intact rotator cuff. Mild supraspinatus and infraspinatus tendinosis. 3. Mild acromioclavicular and glenohumeral osteoarthritis.  The patient also completed the nerve conduction test (EMG/PNCV) of the right upper extremity on 12/08/2020.  The test was done at [facility] in [place].  The results of the test indicated that the test was a normal study.  There is no evidence of carpal tunnel syndrome or cervical radiculopathy.  The patient indicates that she has an appointment with the orthopedic surgeons on Monday.  I have encouraged her to keep it and to let them know about the results from the MRI and the nerve conduction test.  I suggested that perhaps they may have a physical therapist on staff that may be able to get to work on this a little sooner.  In any case I will go ahead and enter a referral to physical therapy.  Pharmacotherapy Assessment   Analgesic: Morphine IR 15 mg 1 tab p.o. every 6 hours (60 mg/day of morphine) MME/day: 60 mg/day.   Monitoring: [place] PMP: PDMP reviewed during this encounter.       Pharmacotherapy: No side-effects or adverse reactions reported. Compliance: No problems identified. Effectiveness: Clinically acceptable. Plan: Refer to "POC". UDS:  Summary  Date Value Ref Range Status  08/18/2020 Note  Final    Comment:    ==================================================================== [employer] 13 (MW) ==================================================================== Test                             Result       Flag       Units  Drug Present and Declared for Prescription Verification   Alprazolam                     170          EXPECTED   ng/mg creat   Alpha-hydroxyalprazolam        484          EXPECTED   ng/mg creat    Source of alprazolam is a scheduled prescription medication. Alpha-    hydroxyalprazolam is an expected metabolite  of alprazolam.    Morphine                       >9524        EXPECTED   ng/mg creat  Normorphine                    757          EXPECTED   ng/mg creat    Potential sources of large amounts of morphine in the absence of    codeine include administration of morphine or use of heroin.     Normorphine is an expected metabolite of morphine.    Hydromorphone                  650          EXPECTED   ng/mg creat    Hydromorphone may be present as a metabolite of morphine;    concentrations of hydromorphone rarely exceed 5% of the morphine    concentration when this is the source of hydromorphone.  ==================================================================== Test                      Result    Flag   Units      Ref Range   Creatinine              105              mg/dL      >=20 ==================================================================== Declared Medications:  The flagging and interpretation on this report are based on the  following declared medications.  Unexpected results may arise from  inaccuracies in the declared medications.   **Note: The testing scope of this panel includes these medications:   Alprazolam (Xanax)  Morphine (MSIR)   **Note: The testing scope of this panel does not include the  following reported medications:   Acetaminophen (Tylenol)  Amiodarone  Apixaban (Eliquis)  Buspirone (Buspar)  Dicyclomine (Bentyl)  Eye Drops  Linaclotide (Linzess)  Menthol  Montelukast (Singulair)  Ondansetron (Zofran)  Pantoprazole (Protonix)  Polyethylene Glycol (MiraLAX)  Sodium Chloride  Valacyclovir (Valtrex) ==================================================================== For clinical consultation, please call [phone]. ====================================================================      Laboratory Chemistry Profile   Renal Lab Results  Component Value Date   BUN 9 07/22/2020   CREATININE 0.74 07/22/2020   BCR 7 (L) 11/11/2018    GFRAA >60 11/26/2019   GFRNONAA >60 07/22/2020    Hepatic Lab Results  Component Value Date   AST 20 05/01/2020   ALT 13 05/01/2020   ALBUMIN 4.5 05/01/2020   ALKPHOS 98 05/01/2020   LIPASE 28 05/01/2020    Electrolytes Lab Results  Component Value Date   NA 137 07/22/2020   K 3.7 07/22/2020   CL 105 07/22/2020   CALCIUM 8.9 07/22/2020   MG 1.9 11/11/2018    Bone Lab Results  Component Value Date   25OHVITD1 23 (L) 11/11/2018   25OHVITD2 9.8 11/11/2018   25OHVITD3 13 11/11/2018    Inflammation (CRP: Acute Phase) (ESR: Chronic Phase) Lab Results  Component Value Date   CRP 12 (H) 11/11/2018   ESRSEDRATE 42 (H) 11/11/2018   LATICACIDVEN 1.2 05/01/2020         Note: Above Lab results reviewed.  Imaging  MR SHOULDER RIGHT WO CONTRAST CLINICAL DATA:  Chronic right shoulder pain and weakness. History of prior surgery over 20 years ago.  EXAM: MRI OF THE RIGHT SHOULDER WITHOUT CONTRAST  TECHNIQUE: Multiplanar, multisequence MR imaging of the shoulder was performed. No intravenous contrast was administered.  COMPARISON:  Right shoulder x-rays dated May 03, 2007.  FINDINGS: Rotator cuff: Intact rotator cuff. Mild distal supraspinatus and infraspinatus tendinosis.  Muscles: Edema around  the subscapularis myotendinous junction. No muscle atrophy.  Biceps long head:  Intact and normally positioned.  Acromioclavicular Joint: Mild arthropathy of the acromioclavicular joint. Type I acromion. No subacromial/subdeltoid bursal fluid.  Glenohumeral Joint: Diffuse cartilage thinning.  No joint effusion.  Labrum: Grossly intact, but evaluation is limited by lack of intraarticular fluid.  Bones:  No marrow abnormality, fracture or dislocation.  Other: None.  IMPRESSION: 1. Subscapularis muscle strain. 2. Intact rotator cuff. Mild supraspinatus and infraspinatus tendinosis. 3. Mild acromioclavicular and glenohumeral osteoarthritis.  Electronically  Signed   By: Titus Dubin M.D.   On: 12/11/2020 11:18  Assessment  The primary encounter diagnosis was Chronic shoulder pain (Right). Diagnoses of Osteoarthritis of shoulder (Right), Weakness of upper extremity (Right), and Arthropathy of shoulder (Right) were also pertinent to this visit.  Plan of Care  Problem-specific:  No problem-specific Assessment & Plan notes found for this encounter.  Ms. DARRIANA DEBOY has a current medication list which includes the following long-term medication(s): apixaban, dicyclomine, linzess, montelukast, morphine, [START ON 12/17/2020] morphine, [START ON 01/16/2021] morphine, [START ON 02/15/2021] morphine, and pantoprazole.  Pharmacotherapy (Medications Ordered): No orders of the defined types were placed in this encounter.  Orders:  Orders Placed This Encounter  Procedures   Ambulatory referral to Physical Therapy    Referral Priority:   Routine    Referral Type:   Physical Medicine    Referral Reason:   Specialty Services Required    Requested Specialty:   Physical Therapy    Number of Visits Requested:   1    Follow-up plan:   Return for scheduled encounter.     Interventional Therapies  Risk  Complexity Considerations:   1. SHELLFISH ALLERGY; Dexamethasone & Prednisone allergy.  (NO STEROIDS)  2. NOT a candidate for lumbar facet RFA (due to hardware). 3. NOT a candidate for membrane stabilizers (Neurontin/Lyrica) due to prior failed trials. 4. NOT a candidate for SCS (due to HARDWARE). 5. NOT a candidate for IT Pump (patient's choice due to surgical risks).      Planned  Pending:   Right shoulder MRI  Right upper extremity nerve conduction test  Referral to orthopedic surgery to evaluate right shoulder pain and decreased range of motion as well asked the results of her upcoming MRI.   Under consideration:   Diagnostic right suprascapular nerve block #3 (without steroids)    Completed:   Diagnostic bilateral lumbar facet block  x2 (01/14/2019) Palliative bilateral suprascapular NB x2 (No Steroids) (06/19/2016) Palliative left suprascapular nerve RFA x1 (01/04/2017)   Therapeutic  Palliative (PRN) options:   Diagnostic bilateral lumbar facet block #2  Palliative bilateral suprascapular NB #3 (No Steroids) Palliative left suprascapular nerve RFA #2     Recent Visits Date Type Provider Dept  11/30/20 Office Visit Milinda Pointer, MD [facility]  10/27/20 Telemedicine Milinda Pointer, MD [facility]  Showing recent visits within past 90 days and meeting all other requirements Today's Visits Date Type Provider Dept  12/16/20 Office Visit Milinda Pointer, MD [facility]  Showing today's visits and meeting all other requirements Future Appointments No visits were found meeting these conditions. Showing future appointments within next 90 days and meeting all other requirements I discussed the assessment and treatment plan with the patient. The patient was provided an opportunity to ask questions and all were answered. The patient agreed with the plan and demonstrated an understanding of the instructions.  Patient advised to call back or seek an in-person evaluation if  the symptoms or condition worsens.  Duration of encounter: 15 minutes.  Note by: Gaspar Cola, MD Date: 12/16/2020; Time: 2:41 PM

## 2020-12-16 ENCOUNTER — Other Ambulatory Visit: Payer: Self-pay

## 2020-12-16 ENCOUNTER — Encounter: Payer: Self-pay | Admitting: Pain Medicine

## 2020-12-16 ENCOUNTER — Ambulatory Visit: Payer: Medicare Other | Attending: Pain Medicine | Admitting: Pain Medicine

## 2020-12-16 DIAGNOSIS — R29898 Other symptoms and signs involving the musculoskeletal system: Secondary | ICD-10-CM

## 2020-12-16 DIAGNOSIS — G8929 Other chronic pain: Secondary | ICD-10-CM | POA: Diagnosis not present

## 2020-12-16 DIAGNOSIS — M19011 Primary osteoarthritis, right shoulder: Secondary | ICD-10-CM

## 2020-12-16 DIAGNOSIS — M25511 Pain in right shoulder: Secondary | ICD-10-CM

## 2020-12-20 DIAGNOSIS — M25511 Pain in right shoulder: Secondary | ICD-10-CM | POA: Diagnosis not present

## 2020-12-20 DIAGNOSIS — M7541 Impingement syndrome of right shoulder: Secondary | ICD-10-CM | POA: Diagnosis not present

## 2020-12-24 DIAGNOSIS — G4733 Obstructive sleep apnea (adult) (pediatric): Secondary | ICD-10-CM | POA: Diagnosis not present

## 2020-12-30 ENCOUNTER — Telehealth: Payer: Self-pay | Admitting: Family Medicine

## 2020-12-30 NOTE — Telephone Encounter (Signed)
Copied from [place] [id number]. Topic: Medicare AWV >> Dec 30, 2020 10:37 AM Cher Nakai R wrote: Reason for CRM:  LM re new appt info due to  [facility] not starting until 01/24/2021-srs

## 2021-01-13 ENCOUNTER — Inpatient Hospital Stay
Admission: EM | Admit: 2021-01-13 | Discharge: 2021-01-17 | DRG: 390 | Disposition: A | Discharge status: Home / Self Care | Payer: Medicare Other | Attending: Internal Medicine | Admitting: Internal Medicine

## 2021-01-13 ENCOUNTER — Other Ambulatory Visit: Payer: Self-pay

## 2021-01-13 ENCOUNTER — Emergency Department: Payer: Medicare Other

## 2021-01-13 DIAGNOSIS — R1013 Epigastric pain: Secondary | ICD-10-CM

## 2021-01-13 DIAGNOSIS — Z8616 Personal history of COVID-19: Secondary | ICD-10-CM

## 2021-01-13 DIAGNOSIS — R0789 Other chest pain: Secondary | ICD-10-CM | POA: Diagnosis not present

## 2021-01-13 DIAGNOSIS — Z91013 Allergy to seafood: Secondary | ICD-10-CM | POA: Diagnosis not present

## 2021-01-13 DIAGNOSIS — J984 Other disorders of lung: Secondary | ICD-10-CM | POA: Diagnosis not present

## 2021-01-13 DIAGNOSIS — R079 Chest pain, unspecified: Secondary | ICD-10-CM | POA: Diagnosis not present

## 2021-01-13 DIAGNOSIS — Z7901 Long term (current) use of anticoagulants: Secondary | ICD-10-CM

## 2021-01-13 DIAGNOSIS — K56609 Unspecified intestinal obstruction, unspecified as to partial versus complete obstruction: Secondary | ICD-10-CM | POA: Diagnosis not present

## 2021-01-13 DIAGNOSIS — F32A Depression, unspecified: Secondary | ICD-10-CM | POA: Diagnosis not present

## 2021-01-13 DIAGNOSIS — I4891 Unspecified atrial fibrillation: Secondary | ICD-10-CM

## 2021-01-13 DIAGNOSIS — R002 Palpitations: Secondary | ICD-10-CM | POA: Diagnosis not present

## 2021-01-13 DIAGNOSIS — I480 Paroxysmal atrial fibrillation: Secondary | ICD-10-CM | POA: Diagnosis present

## 2021-01-13 DIAGNOSIS — Z86010 Personal history of colonic polyps: Secondary | ICD-10-CM

## 2021-01-13 DIAGNOSIS — J45909 Unspecified asthma, uncomplicated: Secondary | ICD-10-CM | POA: Diagnosis present

## 2021-01-13 DIAGNOSIS — K2970 Gastritis, unspecified, without bleeding: Secondary | ICD-10-CM | POA: Diagnosis present

## 2021-01-13 DIAGNOSIS — Z91041 Radiographic dye allergy status: Secondary | ICD-10-CM | POA: Diagnosis not present

## 2021-01-13 DIAGNOSIS — K449 Diaphragmatic hernia without obstruction or gangrene: Secondary | ICD-10-CM | POA: Diagnosis not present

## 2021-01-13 DIAGNOSIS — Z9049 Acquired absence of other specified parts of digestive tract: Secondary | ICD-10-CM

## 2021-01-13 DIAGNOSIS — Z882 Allergy status to sulfonamides status: Secondary | ICD-10-CM

## 2021-01-13 DIAGNOSIS — K56600 Partial intestinal obstruction, unspecified as to cause: Secondary | ICD-10-CM | POA: Diagnosis not present

## 2021-01-13 DIAGNOSIS — G894 Chronic pain syndrome: Secondary | ICD-10-CM | POA: Diagnosis not present

## 2021-01-13 DIAGNOSIS — I4819 Other persistent atrial fibrillation: Secondary | ICD-10-CM | POA: Diagnosis not present

## 2021-01-13 DIAGNOSIS — Z23 Encounter for immunization: Secondary | ICD-10-CM | POA: Diagnosis not present

## 2021-01-13 DIAGNOSIS — K219 Gastro-esophageal reflux disease without esophagitis: Secondary | ICD-10-CM | POA: Diagnosis not present

## 2021-01-13 DIAGNOSIS — M5090 Cervical disc disorder, unspecified, unspecified cervical region: Secondary | ICD-10-CM | POA: Diagnosis present

## 2021-01-13 DIAGNOSIS — Z888 Allergy status to other drugs, medicaments and biological substances status: Secondary | ICD-10-CM

## 2021-01-13 DIAGNOSIS — K567 Ileus, unspecified: Secondary | ICD-10-CM | POA: Diagnosis not present

## 2021-01-13 DIAGNOSIS — E876 Hypokalemia: Secondary | ICD-10-CM | POA: Diagnosis present

## 2021-01-13 DIAGNOSIS — I517 Cardiomegaly: Secondary | ICD-10-CM | POA: Diagnosis not present

## 2021-01-13 DIAGNOSIS — K2900 Acute gastritis without bleeding: Secondary | ICD-10-CM | POA: Diagnosis not present

## 2021-01-13 DIAGNOSIS — Z885 Allergy status to narcotic agent status: Secondary | ICD-10-CM

## 2021-01-13 DIAGNOSIS — I10 Essential (primary) hypertension: Secondary | ICD-10-CM | POA: Diagnosis present

## 2021-01-13 DIAGNOSIS — F4310 Post-traumatic stress disorder, unspecified: Secondary | ICD-10-CM | POA: Diagnosis present

## 2021-01-13 DIAGNOSIS — K6389 Other specified diseases of intestine: Secondary | ICD-10-CM | POA: Diagnosis not present

## 2021-01-13 DIAGNOSIS — Z96641 Presence of right artificial hip joint: Secondary | ICD-10-CM | POA: Diagnosis present

## 2021-01-13 DIAGNOSIS — G4733 Obstructive sleep apnea (adult) (pediatric): Secondary | ICD-10-CM | POA: Diagnosis present

## 2021-01-13 DIAGNOSIS — Z90710 Acquired absence of both cervix and uterus: Secondary | ICD-10-CM

## 2021-01-13 DIAGNOSIS — Z87442 Personal history of urinary calculi: Secondary | ICD-10-CM

## 2021-01-13 DIAGNOSIS — K8689 Other specified diseases of pancreas: Secondary | ICD-10-CM | POA: Diagnosis not present

## 2021-01-13 DIAGNOSIS — Z91018 Allergy to other foods: Secondary | ICD-10-CM

## 2021-01-13 DIAGNOSIS — Z79899 Other long term (current) drug therapy: Secondary | ICD-10-CM

## 2021-01-13 DIAGNOSIS — Z981 Arthrodesis status: Secondary | ICD-10-CM | POA: Diagnosis not present

## 2021-01-13 DIAGNOSIS — Z880 Allergy status to penicillin: Secondary | ICD-10-CM

## 2021-01-13 DIAGNOSIS — K838 Other specified diseases of biliary tract: Secondary | ICD-10-CM | POA: Diagnosis not present

## 2021-01-13 DIAGNOSIS — Z20822 Contact with and (suspected) exposure to covid-19: Secondary | ICD-10-CM | POA: Diagnosis present

## 2021-01-13 DIAGNOSIS — R112 Nausea with vomiting, unspecified: Secondary | ICD-10-CM

## 2021-01-13 DIAGNOSIS — Z818 Family history of other mental and behavioral disorders: Secondary | ICD-10-CM

## 2021-01-13 DIAGNOSIS — Z881 Allergy status to other antibiotic agents status: Secondary | ICD-10-CM

## 2021-01-13 DIAGNOSIS — K529 Noninfective gastroenteritis and colitis, unspecified: Secondary | ICD-10-CM

## 2021-01-13 LAB — COMPREHENSIVE METABOLIC PANEL
ALT: 11 U/L (ref 0–44)
AST: 21 U/L (ref 15–41)
Albumin: 4.3 g/dL (ref 3.5–5.0)
Alkaline Phosphatase: 108 U/L (ref 38–126)
Anion gap: 10 (ref 5–15)
BUN: 12 mg/dL (ref 8–23)
CO2: 26 mmol/L (ref 22–32)
Calcium: 9.1 mg/dL (ref 8.9–10.3)
Chloride: 102 mmol/L (ref 98–111)
Creatinine, Ser: 1.07 mg/dL — ABNORMAL HIGH (ref 0.44–1.00)
GFR, Estimated: 59 mL/min — ABNORMAL LOW (ref >60)
Glucose, Bld: 144 mg/dL — ABNORMAL HIGH (ref 70–99)
Potassium: 2.9 mmol/L — ABNORMAL LOW (ref 3.5–5.1)
Sodium: 138 mmol/L (ref 135–145)
Total Bilirubin: 0.8 mg/dL (ref 0.3–1.2)
Total Protein: 7.9 g/dL (ref 6.5–8.1)

## 2021-01-13 LAB — CBC WITH DIFFERENTIAL/PLATELET
Abs Immature Granulocytes: 0.02 10*3/uL (ref 0.00–0.07)
Basophils Absolute: 0.1 10*3/uL (ref 0.0–0.1)
Basophils Relative: 1 %
Eosinophils Absolute: 0.1 10*3/uL (ref 0.0–0.5)
Eosinophils Relative: 1 %
HCT: 38.6 % (ref 36.0–46.0)
Hemoglobin: 12.7 g/dL (ref 12.0–15.0)
Immature Granulocytes: 0 %
Lymphocytes Relative: 37 %
Lymphs Abs: 3.1 10*3/uL (ref 0.7–4.0)
MCH: 27.1 pg (ref 26.0–34.0)
MCHC: 32.9 g/dL (ref 30.0–36.0)
MCV: 82.5 fL (ref 80.0–100.0)
Monocytes Absolute: 0.5 10*3/uL (ref 0.1–1.0)
Monocytes Relative: 6 %
Neutro Abs: 4.5 10*3/uL (ref 1.7–7.7)
Neutrophils Relative %: 55 %
Platelets: 353 10*3/uL (ref 150–400)
RBC: 4.68 MIL/uL (ref 3.87–5.11)
RDW: 13.7 % (ref 11.5–15.5)
WBC: 8.3 10*3/uL (ref 4.0–10.5)
nRBC: 0 % (ref 0.0–0.2)

## 2021-01-13 LAB — TROPONIN I (HIGH SENSITIVITY)
Troponin I (High Sensitivity): 11 ng/L (ref <18)
Troponin I (High Sensitivity): 11 ng/L (ref <18)

## 2021-01-13 LAB — D-DIMER, QUANTITATIVE: D-Dimer, Quant: 0.34 ug/mL-FEU (ref 0.00–0.50)

## 2021-01-13 LAB — RESP PANEL BY RT-PCR (FLU A&B, COVID) ARPGX2
Influenza A by PCR: NEGATIVE
Influenza B by PCR: NEGATIVE
SARS Coronavirus 2 by RT PCR: NEGATIVE

## 2021-01-13 MED ORDER — LIDOCAINE VISCOUS HCL 2 % MT SOLN
15.0000 mL | Freq: Once | OROMUCOSAL | Status: AC
  Administered 2021-01-13: 15 mL via ORAL
  Filled 2021-01-13: qty 15

## 2021-01-13 MED ORDER — ALUM & MAG HYDROXIDE-SIMETH 200-200-20 MG/5ML PO SUSP
30.0000 mL | Freq: Once | ORAL | Status: AC
  Administered 2021-01-13: 30 mL via ORAL
  Filled 2021-01-13: qty 30

## 2021-01-13 MED ORDER — DILTIAZEM HCL ER COATED BEADS 300 MG PO CP24: 300.0000 mg | ORAL_CAPSULE | Freq: Every day | ORAL | 1 refills | Status: DC

## 2021-01-13 MED ORDER — DILTIAZEM HCL 25 MG/5ML IV SOLN
10.0000 mg | Freq: Once | INTRAVENOUS | Status: AC
  Administered 2021-01-13: 10 mg via INTRAVENOUS
  Filled 2021-01-13: qty 5

## 2021-01-13 MED ORDER — APIXABAN 5 MG PO TABS
5.0000 mg | ORAL_TABLET | Freq: Once | ORAL | Status: AC
Start: 2021-01-13 — End: 2021-01-13
  Administered 2021-01-13: 5 mg via ORAL

## 2021-01-13 MED ORDER — DILTIAZEM HCL ER COATED BEADS 240 MG PO CP24
240.0000 mg | ORAL_CAPSULE | Freq: Once | ORAL | Status: DC
  Filled 2021-01-13: qty 1

## 2021-01-13 MED ORDER — DILTIAZEM HCL-DEXTROSE 125-5 MG/125ML-% IV SOLN (PREMIX)
5.0000 mg/h | INTRAVENOUS | Status: DC
  Administered 2021-01-13: 5 mg/h via INTRAVENOUS
  Filled 2021-01-13: qty 125

## 2021-01-13 MED ORDER — DILTIAZEM HCL ER COATED BEADS 300 MG PO CP24
300.0000 mg | ORAL_CAPSULE | Freq: Once | ORAL | Status: AC
  Administered 2021-01-14: 300 mg via ORAL
  Filled 2021-01-13: qty 1

## 2021-01-13 MED ORDER — PANTOPRAZOLE SODIUM 40 MG PO TBEC
40.0000 mg | DELAYED_RELEASE_TABLET | Freq: Once | ORAL | Status: AC
Start: 2021-01-13 — End: 2021-01-13
  Administered 2021-01-13: 40 mg via ORAL
  Filled 2021-01-13: qty 1

## 2021-01-13 MED ORDER — ONDANSETRON HCL 4 MG/2ML IJ SOLN
4.0000 mg | Freq: Once | INTRAMUSCULAR | Status: AC
  Administered 2021-01-13: 4 mg via INTRAVENOUS
  Filled 2021-01-13: qty 2

## 2021-01-13 NOTE — ED Triage Notes (Signed)
Pt in with co palpitations and chest pain for 6 hrs hx of afib but had ablation 6 months ago. Pt is on eliquis for the same.

## 2021-01-13 NOTE — ED Provider Notes (Addendum)
[facility] Emergency Department Provider Note   ____________________________________________   Event Date/Time   First MD Initiated Contact with Patient 01/13/21 2002     (approximate)  I have reviewed the triage vital signs and the nursing notes.   HISTORY  Chief Complaint Chest Pain   HPI [name] is a 60 y.o. female who reports she has had a history of a flutter or A. fib which has been very hard to control.  She had an ablation done and was doing well but now is back with a rapid heartbeat.  She is having chest tightness nausea and some shortness of breath.  Is been going on for about 6 hours.  Patient is still taking her Eliquis.  She is not taking her diltiazem anymore.         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    Chronic headaches    Chronic, continuous use of opioids    DDD (degenerative disc disease)    CERVICAL AND LUMBAR-CHRONIC PAIN, RT HIP LABRAL TEAR   Depression    PT STATES A LOT OF STRESS IN HER LIFE   Dissociative disorder    Dizziness 04/22/2013   Duodenal ulcer with hemorrhage and perforation ([place]) 04/27/2003   Foot drop, right    FROM BACK SURGERY   GERD (gastroesophageal reflux disease)    H/O arthrodesis 08/18/2014   Headache(784.0)    AND NECK PAIN--STATES RECENT TEST SHOW CERVICAL DEGENERATION   History of blood transfusion    s/p back surgery   History of cardiac cath    a. 05/2019 Cath: Nl cors. EF 55-65%.   History of cardioversion    History of cervical spinal surgery 01/04/2015   History of kidney stones    Hypertension     Inverted T wave    Mitral regurgitation    a. 07/2020 Echo: EF 60-65%, no rwma. Nl RV size/fxn. RVSP 39.37mmHg. Mildly to mod dil LA. Mod MR; b. 07/2020 TEE: EF 55-60%. Lambl's excresence. Nl RV size/fxn. Midly dil LA. No LA/LAA thrombus. Mild MR.   Narrowing of intervertebral disc space 08/18/2014   Currently on disability.    Orthostatic hypotension 04/22/2013   Pain    CHRONIC NECK AND BACK PAIN - LIMITED ROM NECK - S/P FUSIONS CERVICAL AND LUMBAR   Paroxysmal atrial fibrillation ([place]) 2021   a. 07/2020 s/p PVI.   Pneumonia    PONV (postoperative nausea and vomiting)    PT GIVES HX OF N&V AND FEVER WITH SURGERIES YEARS AGO--BUT NO PROBLEMS WITH MORE RECENT SURGERIES--STATES NOT MALIGNANT HYPERTHERMIA   Postop Hyponatremia 05/14/2012   Postoperative anemia due to acute blood loss 05/14/2012   PTSD (post-traumatic stress disorder)    Right foot drop    Right hip arthralgia 08/18/2014   Status post surgery of right, and now needs left.    Sleep apnea 2021   does not have a cpap   Therapeutic opioid-induced constipation (OIC)    Typical atrial flutter 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   Chronic use of opiate for therapeutic purpose 08/18/2020   Persistent atrial fibrillation ([place]) 06/30/2020   Abnormal MRI, cervical spine (05/12/2020) 06/24/2020   Chronic upper back pain 06/24/2020   Enthesopathy of shoulder (Left) 05/13/2020   Osteoarthritis of shoulder (Left) 05/13/2020   Symptoms referable to shoulder joint 05/13/2020   Tendinopathy of  rotator cuff (Left) 05/13/2020   Sprain of supraspinatus muscle or tendon, sequela (Left) 05/13/2020   Subdeltoid bursitis of shoulder (Left) 05/13/2020   Subacromial bursitis of shoulder (Left) 05/13/2020   Chronic shoulder pain (Left) 05/13/2020   Acute postoperative pain 05/13/2020   History of allergy to iodine 05/13/2020    Class: History of   Abnormal MRI, shoulder (03/20/2017) 04/21/2020   Chronic anticoagulation (Eliquis) 04/21/2020   Chronic neck pain with history of cervical spinal surgery 04/21/2020   Multiple allergies 04/21/2020   S/P repair of paraesophageal hernia 02/26/2020   Heartburn    Gastritis without bleeding    Typical atrial flutter (HCC)    Foraminal stenosis of cervical region (C3-4) (Right) 08/26/2019   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) 08/26/2019   DDD (degenerative disc disease), cervical 08/25/2019   Cervical radiculitis (C4,C5,C8) (Left) 08/20/2019   Pure hypercholesterolemia 06/27/2019   Constipation due to opioid therapy 06/27/2019   Snoring 06/27/2019   Unstable angina (HCC) 06/10/2019   Chronic musculoskeletal pain 01/14/2019   Hypertension 12/27/2018   Spondylosis without myelopathy or radiculopathy, lumbosacral region 11/12/2018   History of allergy to radiographic contrast media 11/12/2018   History of allergy to shellfish 11/12/2018   DDD (degenerative disc disease), lumbosacral 07/31/2018   Neuropathic pain 07/31/2018   Neurogenic pain 07/31/2018   Vitamin D deficiency 05/28/2018   History of lumbar surgery 05/28/2018   Groin pain, chronic, left 05/20/2018   Other intervertebral disc degeneration, lumbar region 05/20/2018   Disorder of skeletal system 05/20/2018   Problems influencing health status 05/20/2018   Overweight 12/25/2017   Enthesopathy of hip region (Left) 07/13/2017   Trigger point of shoulder region (Left) 02/26/2017   Pharmacologic therapy    Polyp of sigmoid colon    Problems with swallowing  and mastication    Chronic shoulder arthropathy (Left) 01/04/2017   Dysphagia 12/07/2016   Abnormal flushing and sweating 12/07/2016   Long term prescription benzodiazepine use 10/11/2016   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) 10/11/2016   Chronic hip pain (2ry area of Pain) (Bilateral) (L>R) 10/11/2016   Osteoarthritis of shoulder (Bilateral) (L>R) 05/17/2016   Cervicogenic headache 07/06/2015   History of total hip replacement (THR) (Right) 07/05/2015   Chronic shoulder radicular pain (Bilateral) (L>R) 07/05/2015   Lumbar facet syndrome (Bilateral) (L>R) 07/05/2015   Chronic low back pain (1ry area of Pain) (Bilateral) (L>R) w/o sciatica 04/05/2015   Chronic hip pain (Right) 01/04/2015   Encounter for therapeutic drug level monitoring 01/04/2015   Long term current use of opiate analgesic 01/04/2015   Long term prescription opiate use 01/04/2015   Opiate use 01/04/2015   Chronic pain syndrome 01/04/2015   Steroid intolerance 01/04/2015   Cervical spondylosis  01/04/2015   Cervical facet arthropathy (Bilateral) 01/04/2015   History of cervical spinal surgery 01/04/2015   Chronic shoulder pain (3ry area of Pain) (Bilateral) (L>R) 01/04/2015   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   Epidural fibrosis 01/04/2015   Lumbar spondylosis 01/04/2015   Cervical facet syndrome (Bilateral) 01/04/2015   Chronic sacroiliac joint pain (Left) 01/04/2015   DOE (dyspnea on exertion) 09/03/2014   Asthma, mild 08/18/2014   Blurred vision 08/18/2014   Benign paroxysmal positional nystagmus 08/18/2014   Clinical depression 08/18/2014   Fatigue 08/18/2014   Cannot sleep 08/18/2014   Palpitations 08/18/2014   RAD (reactive airway disease) 08/18/2014   OSA (obstructive sleep apnea) 08/18/2014   Lightheadedness 04/22/2013   Chronic neck pain 04/16/2013   Anxiety state 08/03/2003   Colon, diverticulosis 07/15/2003   IBS (irritable bowel syndrome)  04/27/2003   Esophagitis, reflux 02/11/2003   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    Past Surgical History:  Procedure Laterality Date   ABDOMINAL HYSTERECTOMY     afib  05/2019   ANTERIOR CERVICAL DECOMP/DISCECTOMY FUSION N/A 10/30/2012   Procedure: ACDF C5-6, EXPLORATION AND HARDWARE REMOVAL C6-7;  Surgeon: Melina Schools, MD;  Location: [place];  Service: Orthopedics;  Laterality: N/A;   ANTERIOR FUSION CERVICAL SPINE  MAY 2012   AT [place] N/A 08/06/2020   Procedure: ATRIAL FIBRILLATION ABLATION;  Surgeon: Vickie Epley, MD;  Location: [place] CV LAB;  Service: Cardiovascular;  Laterality: N/A;   BACK SURGERY  2009   LUMBAR FUSION WITH RODS    BREAST BIOPSY Right 2008   benign.- bx/clip   BREAST EXCISIONAL BIOPSY Left 1998   benign   BREAST REDUCTION SURGERY Bilateral 06/2016   CARDIAC ELECTROPHYSIOLOGY MAPPING AND ABLATION     CARDIOVERSION N/A 12/03/2019   Procedure: CARDIOVERSION;  Surgeon: Kate Sable, MD;  Location: [facility];  Service: Cardiovascular;  Laterality: N/A;   CARPAL TUNNEL RELEASE  05-06-12   Right   CHOLECYSTECTOMY     COLONOSCOPY WITH PROPOFOL N/A 01/26/2017   Procedure: COLONOSCOPY WITH PROPOFOL;  Surgeon: Lucilla Lame, MD;  Location: [place];  Service: Endoscopy;  Laterality: N/A;   DIAGNOSTIC LAPAROSCOPIES - MULTIPLE FOR ENDOMETRIOSIS     ESOPHAGEAL DILATION N/A 01/26/2017   Procedure: ESOPHAGEAL DILATION;  Surgeon: Lucilla Lame, MD;  Location: [place];  Service: Endoscopy;  Laterality: N/A;   ESOPHAGEAL MANOMETRY N/A 05/30/2017   Procedure: ESOPHAGEAL MANOMETRY (EM);  Surgeon: Lucilla Lame, MD;  Location: [facility];  Service: Endoscopy;  Laterality: N/A;   ESOPHAGOGASTRODUODENOSCOPY (EGD) WITH PROPOFOL N/A 01/26/2017   Procedure: ESOPHAGOGASTRODUODENOSCOPY (EGD) WITH PROPOFOL;  Surgeon: Lucilla Lame, MD;  Location: [place];  Service: Endoscopy;  Laterality: N/A;    ESOPHAGOGASTRODUODENOSCOPY (EGD) WITH PROPOFOL N/A 01/30/2020   Procedure: ESOPHAGOGASTRODUODENOSCOPY (EGD) WITH PROPOFOL;  Surgeon: Lucilla Lame, MD;  Location: [place];  Service: Endoscopy;  Laterality: N/A;  sleep apnea COVID + 01-15-20   HIP ARTHROSCOPY  09/20/2011   Procedure: ARTHROSCOPY HIP;  Surgeon: Gearlean Alf, MD;  Location: [facility];  Service: Orthopedics;  Laterality: Right;  Right Hip Scope with Labral Debridement   LEFT HEART CATH AND CORONARY ANGIOGRAPHY Left 06/10/2019   Procedure: LEFT HEART CATH AND CORONARY ANGIOGRAPHY;  Surgeon: Nelva Bush, MD;  Location: [place] CV LAB;  Service: Cardiovascular;  Laterality: Left;   NASAL SEPTUM SURGERY  MARCH 2013   IN [place]   [facility] IMPEDANCE STUDY N/A 05/30/2017  Procedure: [place] IMPEDANCE STUDY;  Surgeon: Lucilla Lame, MD;  Location: [facility] END[facility];  Service: Endoscopy;  Laterality: N/A;   POLYPECTOMY N/A 01/26/2017   Procedure: POLYPECTOMY;  Surgeon: Lucilla Lame, MD;  Location: [place];  Service: Endoscopy;  Laterality: N/A;   POSTERIOR CERVICAL FUSION/FORAMINOTOMY N/A 04/16/2013   Procedure: REMOVAL CERVICAL PLATES AND INTERBODY CAGE/POSTERIOR CERVICAL SPINAL FUSION C4 - C6/C5 CORPECTOMY/C4 - C6 FUSION WITH ILIAC CREST BONE GRAFT;  Surgeon: Melina Schools, MD;  Location: [place];  Service: Orthopedics;  Laterality: N/A;   RADIOFREQUENCY ABLATION NERVES     REDUCTION MAMMAPLASTY Bilateral 05/2016   RIGHT HIP ARTHROSCOPY FOR LABRAL TEAR  ABOUT 2010   2012 also   SHOULDER ARTHROSCOPY  05-06-12   bone spur   TEE WITHOUT CARDIOVERSION N/A 08/05/2020   Procedure: TRANSESOPHAGEAL ECHOCARDIOGRAM (TEE);  Surgeon: Lelon Perla, MD;  Location: [facility] ENDOSCOPY;  Service: Cardiovascular;  Laterality: N/A;   TONSILLECTOMY     AS A CHILD   TOTAL HIP ARTHROPLASTY Right 05/13/2012   Procedure: TOTAL HIP ARTHROPLASTY ANTERIOR APPROACH;  Surgeon: Gearlean Alf, MD;  Location: [facility];  Service: Orthopedics;   Laterality: Right;    Prior to Admission medications   Medication Sig Start Date End Date Taking? Authorizing Provider  acetaminophen (TYLENOL) 500 MG tablet Take 500-1,000 mg by mouth every 6 (six) hours as needed for moderate pain.     [provider]  ALPRAZolam Duanne Moron) 1 MG tablet Take 1 mg by mouth at bedtime.  07/04/19   [provider]  apixaban (ELIQUIS) 5 MG TABS tablet Take 1 tablet (5 mg total) by mouth 2 (two) times daily. 10/25/20   Kate Sable, MD  busPIRone (BUSPAR) 7.5 MG tablet Take 7.5 mg by mouth 2 (two) times daily. 05/06/20   [provider]  dicyclomine (BENTYL) 20 MG tablet TAKE 1 TABLET (20 MG TOTAL) BY MOUTH 3 (THREE) TIMES DAILY BEFORE MEALS. 11/18/20   Lucilla Lame, MD  LINZESS 290 MCG CAPS capsule TAKE 1 CAPSULE BY MOUTH DAILY BEFORE BREAKFAST. 07/19/20   Lucilla Lame, MD  Menthol, Topical Analgesic, (BENGAY EX) Apply 1 application topically daily as needed (pain).    [provider]  montelukast (SINGULAIR) 10 MG tablet TAKE 1 TABLET BY MOUTH EVERY DAY 03/28/20   Bacigalupo, Dionne Bucy, MD  morphine (MSIR) 15 MG tablet Take 1 tablet (15 mg total) by mouth every 6 (six) hours as needed for moderate pain or severe pain. Must last 30 days. 11/17/20 12/17/20  Milinda Pointer, MD  morphine (MSIR) 15 MG tablet Take 1 tablet (15 mg total) by mouth every 6 (six) hours as needed for moderate pain or severe pain. Must last 30 days. 12/17/20 01/16/21  Milinda Pointer, MD  morphine (MSIR) 15 MG tablet Take 1 tablet (15 mg total) by mouth every 6 (six) hours as needed for moderate pain or severe pain. Must last 30 days. 01/16/21 02/15/21  Milinda Pointer, MD  morphine (MSIR) 15 MG tablet Take 1 tablet (15 mg total) by mouth every 6 (six) hours as needed for moderate pain or severe pain. Must last 30 days. 02/15/21 03/17/21  Milinda Pointer, MD  naphazoline-pheniramine (VISINE) 0.025-0.3 % ophthalmic solution 1 drop 4 (four) times daily as needed for  eye irritation.    [provider]  pantoprazole (PROTONIX) 40 MG tablet TAKE 1 TABLET BY MOUTH TWICE A DAY 05/18/20   Lucilla Lame, MD  polyethylene glycol (MIRALAX / GLYCOLAX) 17 g packet Take 17 g by mouth daily.  [provider]  sodium chloride (OCEAN) 0.65 % SOLN nasal spray Place 1 spray into both nostrils daily as needed for congestion ([place]ry[place]).    [provider]  valACYclovir (VALTREX) 1000 MG tablet TAKE TWO TABLETS BY MOUTH TWICE DAILY FOR ONE DAY FEVER BLISTER 03/02/20   Bacigalupo, Dionne Bucy, MD    Allergies Amoxicillin, Chlorhexidine gluconate, Clindamycin, Codeine, Erythromycin, Penicillin g, Sulfa antibiotics, Levofloxacin, Shellfish allergy, Decadron [dexamethasone], Mangifera indica, Papaya derivatives, Betadine [povidone iodine], Clarithromycin, Other, Povidone-iodine, and Prednisone  Family History  Problem Relation Age of Onset   Aneurysm Mother    Aneurysm Maternal Grandmother    Breast cancer Paternal Grandmother 34   Bipolar disorder Sister    Bipolar disorder Grandchild    Anxiety disorder Grandchild    Depression Grandchild     Social History Social History   Tobacco Use   Smoking status: Never   Smokeless tobacco: Never  Vaping Use   Vaping Use: Never used  Substance Use Topics   Alcohol use: No    Alcohol/week: 0.0 standard drinks   Drug use: Yes    Comment: prescribed morphine and xanax    Review of Systems  Constitutional: No fever/chills Eyes: No visual changes. ENT: No sore throat. Cardiovascular: chest pain. Respiratory:shortness of breath. Gastrointestinal: No abdominal pain.  No nausea, no vomiting.  No diarrhea.  No constipation. Genitourinary: Negative for dysuria. Musculoskeletal: Negative for back pain. Skin: Negative for rash. Neurological: Negative for headaches, focal weakness   ____________________________________________   PHYSICAL EXAM:  VITAL SIGNS: ED Triage Vitals  Enc Vitals Group      BP 01/13/21 1957 (!) 166/125     Pulse Rate 01/13/21 1957 (!) 145     Resp 01/13/21 1957 18     Temp 01/13/21 1957 98.1 F (36.7 C)     Temp Source 01/13/21 1957 Oral     SpO2 01/13/21 1957 99 %     Weight 01/13/21 1958 144 lb (65.3 kg)     Height 01/13/21 1958 5\' 4"  (1.626 m)     Head Circumference --      Peak Flow --      Pain Score 01/13/21 1958 7     Pain Loc --      Pain Edu? --      Excl. in [place]? --     Constitutional: Alert and oriented.  Looks uncomfortable Eyes: Conjunctivae are normal.  Head: Atraumatic. Nose: No congestion/rhinnorhea. Mouth/Throat: Mucous membranes are moist.  Oropharynx non-erythematous. Neck: No stridor.  Cardiovascular: Rapid rate, irregular rhythm. Grossly normal heart sounds.  Good peripheral circulation. Respiratory: Normal respiratory effort.  No retractions. Lungs CTAB. Gastrointestinal: Soft and nontender. No distention. No abdominal bruits.  Musculoskeletal: No lower extremity tenderness  Neurologic:  Normal speech and language. No gross focal neurologic deficits are appreciated.  Skin:  Skin is warm, dry and intact. No rash noted.   ____________________________________________   LABS (all labs ordered are listed, but only abnormal results are displayed)  Labs Reviewed  COMPREHENSIVE METABOLIC PANEL - Abnormal; Notable for the following components:      Result Value   Potassium 2.9 (*)    Glucose, Bld 144 (*)    Creatinine, Ser 1.07 (*)    GFR, Estimated 59 (*)    All other components within normal limits  CBC WITH DIFFERENTIAL/PLATELET  TROPONIN I (HIGH SENSITIVITY)  TROPONIN I (HIGH SENSITIVITY)   ____________________________________________  EKG EKG #1 shows A. fib or flutter with RVR at a rate of 143  there seems to be a slight amount of ST elevation in aVR.  Is not significant.  There is no other noticeable ST-T changes. EKG #2 read interpreted by me shows a flutter with block at a rate of 139 normal axis decreased R wave  progression with flipped T's in the lateral V leads.  These are right-sided leads.  There is no ST segment elevation EKG #3 read interpreted by me shows a flutter at 121 normal axis no acute ST-T changes. EKG #4 done at 12:10 shows normal sinus rhythm rate of 72 normal axis nonspecific ST-T wave changes are seen very small inverted T waves in the V leads which were present on previous EKGs. ____________________________________________  RADIOLOGY Gertha Calkin, personally viewed and evaluated these images (plain radiographs) as part of my medical decision making, as well as reviewing the written report by the radiologist.  ED MD interpretation: Chest x-ray shows clear lungs  Official radiology report(s): DG Chest Portable 1 View  Result Date: 01/13/2021 CLINICAL DATA:  Palpitations and chest pain. EXAM: PORTABLE CHEST 1 VIEW COMPARISON:  February 27, 2020 FINDINGS: The heart size and mediastinal contours are within normal limits. Both lungs are clear. Stable postoperative changes are seen within the mid to lower cervical spine, lower thoracic spine and upper lumbar spine. IMPRESSION: Stable exam without acute cardiopulmonary disease. Electronically Signed   By: Virgina Norfolk M.D.   On: 01/13/2021 20:33    ____________________________________________   PROCEDURES  Procedure(s) performed (including Critical Care): Critical care time 20 minutes.  This includes reviewing the patient's old records and old EKGs looking for right-sided EKG and the other EKGs here and talking to the patient at least 3 times.  I have been monitoring the patient's progress on the monitor as well.  Additionally have to talk to the hospitalist.  Procedures   ____________________________________________   INITIAL IMPRESSION / ASSESSMENT AND PLAN / ED COURSE ----------------------------------------- 9:43 PM on 01/13/2021 -----------------------------------------  Patient now on diltiazem, drip rate has  been increased and patient is generally running about 10 6-1 12 although when we did the EKG it was 121.  Patient's symptoms are better although she still having some chest tightness.  We will increase the drip slightly more.    ----------------------------------------- 12:15 AM on 01/14/2021 ----------------------------------------- Patient has been in normal sinus rhythm now I have discussed her case with Dr. Rockey Situ.  We will try some p.o. Cardizem CD according to up-to-date at a drip rate of 10 converts to CD300 daily.  An hour after she gets the Cardizem CD by mouth we will shut off the drip weight for possibly as much is another hour and if she remains in normal sinus rhythm we will attempt to discharge her with follow-up tomorrow.         ____________________________________________   FINAL CLINICAL IMPRESSION(S) / ED DIAGNOSES  Final diagnoses:  Atrial fibrillation with RVR [facility])     ED Discharge Orders     None        Note:  This document was prepared using Dragon voice recognition software and may include unintentional dictation errors.    Nena Polio, MD 01/13/21 2143    Nena Polio, MD 01/13/21 2144 EKG #4 read and interpreted by me shows normal sinus rhythm rate of 69 normal axis essentially normal EKG now.  Patient unfortunately still is having a lot of pain tightness and burning in her chest.  We will do a D-dimer and repeat the troponin.  Nena Polio, MD 01/13/21 2214    Nena Polio, MD 01/14/21 [phone]

## 2021-01-13 NOTE — Discharge Instructions (Addendum)
Please follow-up with your cardiologist at [facility] or new can see Dr. Rockey Situ who is on-call tonight.  Give them a call in the morning let them know you were in the ER with A. fib and rapid ventricular response that converted with IV diltiazem.  I have ordered Cardizem CD 300 for you.  I have given you 1 dose here in the emergency department you can take another dose tomorrow evening.  Please return here if the rapid heart rate comes back.  Continue all your other medicines in the meantime.  Unless your cardiologist says otherwise I would start the Cardizem prescription which is actually for Cardizem CD300 mg late tomorrow afternoon.

## 2021-01-14 ENCOUNTER — Inpatient Hospital Stay: Payer: Medicare Other

## 2021-01-14 ENCOUNTER — Emergency Department: Payer: Medicare Other

## 2021-01-14 DIAGNOSIS — G8929 Other chronic pain: Secondary | ICD-10-CM | POA: Diagnosis not present

## 2021-01-14 DIAGNOSIS — Z4682 Encounter for fitting and adjustment of non-vascular catheter: Secondary | ICD-10-CM | POA: Diagnosis not present

## 2021-01-14 DIAGNOSIS — J984 Other disorders of lung: Secondary | ICD-10-CM | POA: Diagnosis not present

## 2021-01-14 DIAGNOSIS — K56609 Unspecified intestinal obstruction, unspecified as to partial versus complete obstruction: Principal | ICD-10-CM

## 2021-01-14 DIAGNOSIS — Z20822 Contact with and (suspected) exposure to covid-19: Secondary | ICD-10-CM | POA: Diagnosis present

## 2021-01-14 DIAGNOSIS — R197 Diarrhea, unspecified: Secondary | ICD-10-CM | POA: Diagnosis not present

## 2021-01-14 DIAGNOSIS — R1031 Right lower quadrant pain: Secondary | ICD-10-CM | POA: Diagnosis not present

## 2021-01-14 DIAGNOSIS — F32A Depression, unspecified: Secondary | ICD-10-CM | POA: Diagnosis present

## 2021-01-14 DIAGNOSIS — M5090 Cervical disc disorder, unspecified, unspecified cervical region: Secondary | ICD-10-CM | POA: Diagnosis present

## 2021-01-14 DIAGNOSIS — K2900 Acute gastritis without bleeding: Secondary | ICD-10-CM | POA: Diagnosis not present

## 2021-01-14 DIAGNOSIS — K449 Diaphragmatic hernia without obstruction or gangrene: Secondary | ICD-10-CM | POA: Diagnosis not present

## 2021-01-14 DIAGNOSIS — Z91013 Allergy to seafood: Secondary | ICD-10-CM | POA: Diagnosis not present

## 2021-01-14 DIAGNOSIS — R110 Nausea: Secondary | ICD-10-CM | POA: Diagnosis not present

## 2021-01-14 DIAGNOSIS — Z87442 Personal history of urinary calculi: Secondary | ICD-10-CM | POA: Diagnosis not present

## 2021-01-14 DIAGNOSIS — R1013 Epigastric pain: Secondary | ICD-10-CM | POA: Diagnosis not present

## 2021-01-14 DIAGNOSIS — K838 Other specified diseases of biliary tract: Secondary | ICD-10-CM | POA: Diagnosis not present

## 2021-01-14 DIAGNOSIS — K529 Noninfective gastroenteritis and colitis, unspecified: Secondary | ICD-10-CM | POA: Insufficient documentation

## 2021-01-14 DIAGNOSIS — Z90710 Acquired absence of both cervix and uterus: Secondary | ICD-10-CM | POA: Diagnosis not present

## 2021-01-14 DIAGNOSIS — Z9049 Acquired absence of other specified parts of digestive tract: Secondary | ICD-10-CM | POA: Diagnosis not present

## 2021-01-14 DIAGNOSIS — K8689 Other specified diseases of pancreas: Secondary | ICD-10-CM | POA: Diagnosis not present

## 2021-01-14 DIAGNOSIS — K2970 Gastritis, unspecified, without bleeding: Secondary | ICD-10-CM | POA: Diagnosis not present

## 2021-01-14 DIAGNOSIS — G4733 Obstructive sleep apnea (adult) (pediatric): Secondary | ICD-10-CM | POA: Diagnosis not present

## 2021-01-14 DIAGNOSIS — K219 Gastro-esophageal reflux disease without esophagitis: Secondary | ICD-10-CM | POA: Diagnosis present

## 2021-01-14 DIAGNOSIS — Z8616 Personal history of COVID-19: Secondary | ICD-10-CM | POA: Diagnosis not present

## 2021-01-14 DIAGNOSIS — E7800 Pure hypercholesterolemia, unspecified: Secondary | ICD-10-CM | POA: Diagnosis not present

## 2021-01-14 DIAGNOSIS — I4891 Unspecified atrial fibrillation: Secondary | ICD-10-CM | POA: Diagnosis not present

## 2021-01-14 DIAGNOSIS — Z23 Encounter for immunization: Secondary | ICD-10-CM | POA: Diagnosis not present

## 2021-01-14 DIAGNOSIS — K567 Ileus, unspecified: Secondary | ICD-10-CM | POA: Diagnosis present

## 2021-01-14 DIAGNOSIS — E876 Hypokalemia: Secondary | ICD-10-CM

## 2021-01-14 DIAGNOSIS — I4819 Other persistent atrial fibrillation: Secondary | ICD-10-CM | POA: Diagnosis present

## 2021-01-14 DIAGNOSIS — F4310 Post-traumatic stress disorder, unspecified: Secondary | ICD-10-CM | POA: Diagnosis present

## 2021-01-14 DIAGNOSIS — G894 Chronic pain syndrome: Secondary | ICD-10-CM | POA: Diagnosis present

## 2021-01-14 DIAGNOSIS — K6389 Other specified diseases of intestine: Secondary | ICD-10-CM | POA: Diagnosis not present

## 2021-01-14 DIAGNOSIS — Z91041 Radiographic dye allergy status: Secondary | ICD-10-CM | POA: Diagnosis not present

## 2021-01-14 DIAGNOSIS — R079 Chest pain, unspecified: Secondary | ICD-10-CM | POA: Diagnosis not present

## 2021-01-14 DIAGNOSIS — I10 Essential (primary) hypertension: Secondary | ICD-10-CM | POA: Diagnosis not present

## 2021-01-14 DIAGNOSIS — Z981 Arthrodesis status: Secondary | ICD-10-CM | POA: Diagnosis not present

## 2021-01-14 DIAGNOSIS — J45909 Unspecified asthma, uncomplicated: Secondary | ICD-10-CM | POA: Diagnosis present

## 2021-01-14 DIAGNOSIS — Z96641 Presence of right artificial hip joint: Secondary | ICD-10-CM | POA: Diagnosis present

## 2021-01-14 DIAGNOSIS — R0602 Shortness of breath: Secondary | ICD-10-CM | POA: Diagnosis not present

## 2021-01-14 DIAGNOSIS — R112 Nausea with vomiting, unspecified: Secondary | ICD-10-CM | POA: Diagnosis not present

## 2021-01-14 DIAGNOSIS — I517 Cardiomegaly: Secondary | ICD-10-CM | POA: Diagnosis not present

## 2021-01-14 LAB — BASIC METABOLIC PANEL
Anion gap: 8 (ref 5–15)
BUN: 12 mg/dL (ref 8–23)
CO2: 24 mmol/L (ref 22–32)
Calcium: 8.9 mg/dL (ref 8.9–10.3)
Chloride: 104 mmol/L (ref 98–111)
Creatinine, Ser: 0.85 mg/dL (ref 0.44–1.00)
GFR, Estimated: >60 mL/min (ref >60)
Glucose, Bld: 146 mg/dL — ABNORMAL HIGH (ref 70–99)
Potassium: 4.3 mmol/L (ref 3.5–5.1)
Sodium: 136 mmol/L (ref 135–145)

## 2021-01-14 LAB — MAGNESIUM
Magnesium: 2 mg/dL (ref 1.7–2.4)
Magnesium: 2.2 mg/dL (ref 1.7–2.4)

## 2021-01-14 LAB — CBC
HCT: 33.8 % — ABNORMAL LOW (ref 36.0–46.0)
Hemoglobin: 11.1 g/dL — ABNORMAL LOW (ref 12.0–15.0)
MCH: 26.8 pg (ref 26.0–34.0)
MCHC: 32.8 g/dL (ref 30.0–36.0)
MCV: 81.6 fL (ref 80.0–100.0)
Platelets: 313 10*3/uL (ref 150–400)
RBC: 4.14 MIL/uL (ref 3.87–5.11)
RDW: 13.8 % (ref 11.5–15.5)
WBC: 7.4 10*3/uL (ref 4.0–10.5)
nRBC: 0 % (ref 0.0–0.2)

## 2021-01-14 LAB — IRON AND TIBC
Iron: 30 ug/dL (ref 28–170)
Saturation Ratios: 6 % — ABNORMAL LOW (ref 10.4–31.8)
TIBC: 532 ug/dL — ABNORMAL HIGH (ref 250–450)
UIBC: 502 ug/dL

## 2021-01-14 LAB — LACTIC ACID, PLASMA: Lactic Acid, Venous: 1 mmol/L (ref 0.5–1.9)

## 2021-01-14 LAB — LIPASE, BLOOD: Lipase: 26 U/L (ref 11–51)

## 2021-01-14 LAB — FERRITIN: Ferritin: 7 ng/mL — ABNORMAL LOW (ref 11–307)

## 2021-01-14 LAB — VITAMIN B12: Vitamin B-12: 241 pg/mL (ref 180–914)

## 2021-01-14 LAB — TROPONIN I (HIGH SENSITIVITY)
Troponin I (High Sensitivity): 9 ng/L (ref <18)
Troponin I (High Sensitivity): 9 ng/L (ref <18)

## 2021-01-14 LAB — FOLATE: Folate: 14.9 ng/mL (ref >5.9)

## 2021-01-14 MED ORDER — SODIUM CHLORIDE 0.9 % IV SOLN
12.5000 mg | Freq: Two times a day (BID) | INTRAVENOUS | Status: DC | PRN
  Administered 2021-01-14: 12.5 mg via INTRAVENOUS
  Filled 2021-01-14 (×2): qty 0.5
  Filled 2021-01-14: qty 12.5

## 2021-01-14 MED ORDER — PROCHLORPERAZINE EDISYLATE 10 MG/2ML IJ SOLN
10.0000 mg | Freq: Once | INTRAMUSCULAR | Status: AC
  Administered 2021-01-14: 10 mg via INTRAVENOUS
  Filled 2021-01-14: qty 2

## 2021-01-14 MED ORDER — ALPRAZOLAM 0.5 MG PO TABS
1.0000 mg | ORAL_TABLET | Freq: Every day | ORAL | Status: DC
  Administered 2021-01-15 – 2021-01-16 (×3): 1 mg via ORAL
  Filled 2021-01-14 (×3): qty 2

## 2021-01-14 MED ORDER — PANTOPRAZOLE SODIUM 40 MG PO TBEC: 40.0000 mg | DELAYED_RELEASE_TABLET | Freq: Two times a day (BID) | ORAL | Status: DC

## 2021-01-14 MED ORDER — ONDANSETRON HCL 4 MG/2ML IJ SOLN
4.0000 mg | Freq: Once | INTRAMUSCULAR | Status: AC
  Administered 2021-01-14: 4 mg via INTRAVENOUS
  Filled 2021-01-14: qty 2

## 2021-01-14 MED ORDER — LINACLOTIDE 290 MCG PO CAPS
290.0000 ug | ORAL_CAPSULE | Freq: Every day | ORAL | Status: DC
  Administered 2021-01-16 – 2021-01-17 (×2): 290 ug via ORAL
  Filled 2021-01-14 (×5): qty 1

## 2021-01-14 MED ORDER — CELECOXIB 200 MG PO CAPS
200.0000 mg | ORAL_CAPSULE | Freq: Two times a day (BID) | ORAL | Status: DC
  Administered 2021-01-16: 200 mg via ORAL
  Filled 2021-01-14 (×8): qty 1

## 2021-01-14 MED ORDER — POTASSIUM CHLORIDE IN NACL 40-0.9 MEQ/L-% IV SOLN
INTRAVENOUS | Status: DC
  Filled 2021-01-14 (×3): qty 1000

## 2021-01-14 MED ORDER — BUSPIRONE HCL 15 MG PO TABS
7.5000 mg | ORAL_TABLET | Freq: Two times a day (BID) | ORAL | Status: DC
  Administered 2021-01-14 – 2021-01-17 (×6): 7.5 mg via ORAL
  Filled 2021-01-14: qty 2
  Filled 2021-01-14 (×4): qty 1
  Filled 2021-01-14: qty 2
  Filled 2021-01-14: qty 1

## 2021-01-14 MED ORDER — FENTANYL CITRATE PF 50 MCG/ML IJ SOSY
50.0000 ug | PREFILLED_SYRINGE | Freq: Once | INTRAMUSCULAR | Status: AC
  Administered 2021-01-14: 50 ug via INTRAVENOUS
  Filled 2021-01-14: qty 1

## 2021-01-14 MED ORDER — POLYETHYLENE GLYCOL 3350 17 G PO PACK
17.0000 g | PACK | Freq: Every day | ORAL | Status: DC
  Filled 2021-01-14: qty 1

## 2021-01-14 MED ORDER — POTASSIUM CHLORIDE 20 MEQ PO PACK
40.0000 meq | PACK | Freq: Once | ORAL | Status: AC
  Administered 2021-01-14: 40 meq via ORAL
  Filled 2021-01-14: qty 2

## 2021-01-14 MED ORDER — POTASSIUM CHLORIDE CRYS ER 20 MEQ PO TBCR
40.0000 meq | EXTENDED_RELEASE_TABLET | Freq: Once | ORAL | Status: DC
  Filled 2021-01-14: qty 2

## 2021-01-14 MED ORDER — SALINE SPRAY 0.65 % NA SOLN
1.0000 | Freq: Every day | NASAL | Status: DC | PRN
  Administered 2021-01-15: 1 via NASAL
  Filled 2021-01-14 (×2): qty 44

## 2021-01-14 MED ORDER — APIXABAN 5 MG PO TABS
5.0000 mg | ORAL_TABLET | Freq: Two times a day (BID) | ORAL | Status: DC
  Administered 2021-01-14: 5 mg via ORAL
  Filled 2021-01-14: qty 1

## 2021-01-14 MED ORDER — DICYCLOMINE HCL 20 MG PO TABS
20.0000 mg | ORAL_TABLET | Freq: Three times a day (TID) | ORAL | Status: DC
  Administered 2021-01-14: 20 mg via ORAL
  Filled 2021-01-14: qty 1

## 2021-01-14 MED ORDER — ENOXAPARIN SODIUM 40 MG/0.4ML IJ SOSY: 40.0000 mg | PREFILLED_SYRINGE | INTRAMUSCULAR | Status: DC

## 2021-01-14 MED ORDER — IOHEXOL 350 MG/ML SOLN
80.0000 mL | Freq: Once | INTRAVENOUS | Status: AC | PRN
  Administered 2021-01-14: 80 mL via INTRAVENOUS

## 2021-01-14 MED ORDER — LORAZEPAM 2 MG/ML IJ SOLN
1.0000 mg | Freq: Once | INTRAMUSCULAR | Status: AC
  Administered 2021-01-14: 1 mg via INTRAVENOUS
  Filled 2021-01-14: qty 1

## 2021-01-14 MED ORDER — POTASSIUM CHLORIDE 10 MEQ/100ML IV SOLN
10.0000 meq | INTRAVENOUS | Status: AC
  Administered 2021-01-14 (×2): 10 meq via INTRAVENOUS
  Filled 2021-01-14: qty 100

## 2021-01-14 MED ORDER — MORPHINE SULFATE 15 MG PO TABS: 15.0000 mg | ORAL_TABLET | Freq: Four times a day (QID) | ORAL | Status: DC | PRN

## 2021-01-14 MED ORDER — METOCLOPRAMIDE HCL 5 MG/ML IJ SOLN
10.0000 mg | Freq: Once | INTRAMUSCULAR | Status: AC
  Administered 2021-01-14: 10 mg via INTRAVENOUS
  Filled 2021-01-14: qty 2

## 2021-01-14 MED ORDER — PANTOPRAZOLE SODIUM 40 MG IV SOLR
40.0000 mg | Freq: Two times a day (BID) | INTRAVENOUS | Status: DC
  Administered 2021-01-14 – 2021-01-17 (×6): 40 mg via INTRAVENOUS
  Filled 2021-01-14 (×6): qty 40

## 2021-01-14 MED ORDER — DILTIAZEM HCL ER COATED BEADS 120 MG PO CP24
120.0000 mg | ORAL_CAPSULE | Freq: Every day | ORAL | Status: DC
  Administered 2021-01-14 – 2021-01-17 (×4): 120 mg via ORAL
  Filled 2021-01-14 (×5): qty 1

## 2021-01-14 MED ORDER — MORPHINE SULFATE 15 MG PO TABS
15.0000 mg | ORAL_TABLET | Freq: Four times a day (QID) | ORAL | Status: DC | PRN
  Administered 2021-01-14 – 2021-01-17 (×9): 15 mg via ORAL
  Filled 2021-01-14 (×9): qty 1

## 2021-01-14 MED ORDER — MONTELUKAST SODIUM 10 MG PO TABS
10.0000 mg | ORAL_TABLET | Freq: Every day | ORAL | Status: DC
  Administered 2021-01-14 – 2021-01-17 (×3): 10 mg via ORAL
  Filled 2021-01-14 (×3): qty 1

## 2021-01-14 NOTE — ED Notes (Signed)
Pt assisted to br via wheelchair, Endorsing lightheadedness. PT requesting medicine for  nausea

## 2021-01-14 NOTE — ED Notes (Signed)
Pt stool sample obtained and sent

## 2021-01-14 NOTE — Progress Notes (Signed)
[facility] NAME: [name]    MR#:  [id number]  DATE OF BIRTH:  March 28, 1960  SUBJECTIVE:   patient came in with significant nausea, gagging and chest tightness. Went into a fib last night however remains in sinus rhythm. Continues to have nausea. NG tube removed. Not much NG output. Had a small bowel movement today. No family in the ER. REVIEW OF SYSTEMS:   Review of Systems  Constitutional:  Negative for chills, fever and weight loss.  HENT:  Negative for ear discharge, ear pain and nosebleeds.   Eyes:  Negative for blurred vision, pain and discharge.  Respiratory:  Negative for sputum production, shortness of breath, wheezing and stridor.   Cardiovascular:  Negative for chest pain, palpitations, orthopnea and PND.  Gastrointestinal:  Positive for abdominal pain and nausea. Negative for diarrhea and vomiting.  Genitourinary:  Negative for frequency and urgency.  Musculoskeletal:  Negative for back pain and joint pain.  Neurological:  Negative for sensory change, speech change, focal weakness and weakness.  Psychiatric/Behavioral:  Negative for depression and hallucinations. The patient is not nervous/anxious.   Tolerating Diet:CLD Tolerating PT:   DRUG ALLERGIES:   Allergies  Allergen Reactions   Amoxicillin Hives    She did ok w ANCEF   Chlorhexidine Gluconate Dermatitis and Hives   Clindamycin Hives   Codeine Hives   Erythromycin Hives    "mycins" in general   Penicillin G Hives    "cillins" in general   Sulfa Antibiotics Nausea And Vomiting and Hives        Levofloxacin Hives   Shellfish Allergy Hives   Decadron [Dexamethasone] Other (See Comments)    Hot flashes, insomnia, "manic" Hot flashes, insomnia, "manic"   Mangifera Indica Hives    papaya   Papaya Derivatives Hives   Betadine [Povidone Iodine] Hives        Clarithromycin Hives   Other Hives     Mango    Povidone-Iodine Hives   Prednisone  Anxiety    High blood pressure, flushed, mood changes, heart palpitations High blood pressure, flushed, mood changes, heart palpitations    VITALS:  Blood pressure (!) 153/83, pulse 78, temperature 98.1 F (36.7 C), temperature source Oral, resp. rate 15, height 5\' 4"  (1.626 m), weight 65.3 kg, SpO2 99 %.  PHYSICAL EXAMINATION:   Physical Exam  GENERAL:  60 y.o.-year-old patient lying in the bed with no acute distress.  LUNGS: Normal breath sounds bilaterally, no wheezing, rales, rhonchi. No use of accessory muscles of respiration.  CARDIOVASCULAR: S1, S2 normal. No murmurs, rubs, or gallops.  ABDOMEN: Soft, nontender, nondistended. Bowel sounds present. No organomegaly or mass.  EXTREMITIES: No cyanosis, clubbing or edema b/l.    NEUROLOGIC: Cranial nerves II through XII are intact. No focal Motor or sensory deficits b/l.   PSYCHIATRIC:  patient is alert and oriented x 3.  SKIN: No obvious rash, lesion, or ulcer.   LABORATORY PANEL:  CBC Recent Labs  Lab 01/14/21 0615  WBC 7.4  HGB 11.1*  HCT 33.8*  PLT 313    Chemistries  Recent Labs  Lab 01/13/21 2014 01/14/21 0150 01/14/21 0615  NA 138  --  136  K 2.9*  --  4.3  CL 102  --  104  CO2 26  --  24  GLUCOSE 144*  --  146*  BUN 12  --  12  CREATININE 1.07*  --  0.85  CALCIUM 9.1  --  8.9  MG  --    < > 2.2  AST 21  --   --   ALT 11  --   --   ALKPHOS 108  --   --   BILITOT 0.8  --   --    < > = values in this interval not displayed.   Cardiac Enzymes No results for input(s): TROPONINI in the last 168 hours. RADIOLOGY:  DG Abdomen 1 View  Result Date: 01/14/2021 CLINICAL DATA:  60 year old female NG tube placement. EXAM: ABDOMEN - 1 VIEW COMPARISON:  CT Abdomen and Pelvis 0221 hours today. FINDINGS: AP upright view at 0447 hours. Enteric feeding tube placed into the stomach, tip at the level of the mid gastric body. Side hole is at the level of the fundus. Prior fundoplication seen by CT today. Stable  cholecystectomy clips. Extensive thoracolumbar spinal fusion hardware again noted. Stable bowel gas pattern. No pneumoperitoneum. Negative lung bases. IMPRESSION: Enteric feeding tube placed into the stomach, side hole at the level of the fundus. Electronically Signed   By: Genevie Ann M.D.   On: 01/14/2021 05:31   DG Chest Portable 1 View  Result Date: 01/13/2021 CLINICAL DATA:  Palpitations and chest pain. EXAM: PORTABLE CHEST 1 VIEW COMPARISON:  February 27, 2020 FINDINGS: The heart size and mediastinal contours are within normal limits. Both lungs are clear. Stable postoperative changes are seen within the mid to lower cervical spine, lower thoracic spine and upper lumbar spine. IMPRESSION: Stable exam without acute cardiopulmonary disease. Electronically Signed   By: Virgina Norfolk M.D.   On: 01/13/2021 20:33   DG Abd 2 Views  Result Date: 01/14/2021 CLINICAL DATA:  60 year old female with ileus. EXAM: ABDOMEN - 2 VIEW COMPARISON:  Earlier the same day FINDINGS: Slight interval retraction of indwelling gastric decompression tube, now with the proximal side hole just proximal to the gastroesophageal junction in the tip in the gastric fundus. Similar appearing nonspecific bowel gas pattern. Cholecystectomy clips in right upper quadrant. Similar appearing thoracolumbar fusion hardware and partially visualized right total hip arthroplasty. IMPRESSION: 1. Slight interval retraction of the indwelling gastric decompression tube with the proximal side hole now proximal to the gastroesophageal junction. 2. Similar appearing nonspecific bowel gas pattern. Electronically Signed   By: Ruthann Cancer M.D.   On: 01/14/2021 10:16   CT Angio Chest/Abd/Pel for Dissection W and/or Wo Contrast  Result Date: 01/14/2021 CLINICAL DATA:  Prior Nissen fundoplication with patulous esophagus with fluid retention in the distal esophagus. EXAM: CT ANGIOGRAPHY CHEST, ABDOMEN AND PELVIS TECHNIQUE: Non-contrast CT of the chest was  initially obtained. Multidetector CT imaging through the chest, abdomen and pelvis was performed using the standard protocol during bolus administration of intravenous contrast. Multiplanar reconstructed images and MIPs were obtained and reviewed to evaluate the vascular anatomy. CONTRAST:  54mL OMNIPAQUE IOHEXOL 350 MG/ML SOLN COMPARISON:  Noncontrast abdomen and pelvis CTs 05/01/2020 and 08/28/2016. No prior chest CT for comparison FINDINGS: CTA CHEST FINDINGS Cardiovascular: There is mild panchamber cardiomegaly but with a slight right chamber predominance and bowing of the interventricular septum towards the left which may be seen with right heart strain. There is distension of the superior pulmonary veins as well. There is no pericardial effusion, no pulmonary arterial dilatation or central embolus. There are no appreciable coronary artery calcifications. No aortic aneurysm or dissection is seen and no significant visible plaques with mild descending tortuosity. The great vessels are normal. Mediastinum/Nodes: No enlarged mediastinal, hilar, or axillary lymph nodes. Thyroid gland,  trachea, demonstrate no significant findings. There is a mildly patulous esophagus with scattered fluid in the distal esophagus and a small hiatus hernia. Lungs/Pleura: The lungs are clear of infiltrates. There are scattered linear scar-like opacities in both bases. There is no pleural effusion, thickening or pneumothorax. Central airways in the trachea are clear. Musculoskeletal: There is fusion plating partially visible in the lower cervical spine. Multilevel Harrington rod fusion begins at T10. There is no worrisome regional skeletal lesion. Review of the MIP images confirms the above findings. CTA ABDOMEN AND PELVIS FINDINGS VASCULAR Aorta: Normal caliber aorta without aneurysm, dissection, vasculitis or significant stenosis. There are scattered trace wall calcifications. Celiac: Patent without evidence of aneurysm, dissection,  vasculitis or significant stenosis. SMA: Patent without evidence of aneurysm, dissection, vasculitis or significant stenosis. Renals: There are bilateral single renal arteries and neither demonstrate significant plaques or stenosis. IMA: Normal. Inflow: Common iliac, both internal and external iliac arteries are clear. Veins: The SVC is well opacified but the IVC and iliac veins are unopacified at the time of imaging. The portal vein is fairly well opacified and normal caliber. Review of the MIP images confirms the above findings. NON-VASCULAR: Abdominal viscera not optimally visualized due to extensive metal artifact from multilevel posterior rod fusion, T10-L4. Hepatobiliary: No obvious mass enhancement in the liver. Gallbladder is absent and the common bile duct is dilated measuring 12 mm without a visible obvious filling defect Pancreas: Partially atrophic, otherwise grossly unremarkable. Spleen: No enlargement or mass enhancement. Adrenals/Urinary Tract: There is no adrenal mass. Asymmetric right renal cortical volume loss and upper pole cortical scarring are again shown. There is no renal mass, calculus or hydronephrosis Stomach/Bowel: Small hiatal hernia and evidence of Nissen fundoplication. Mucosal thickening in the jejunal segments in the left abdomen is noted. There is mild dilatation of the left lower quadrant small bowel to up to 2.7 cm and relatively decompressed pelvic and right abdominal small bowel but no visible transitional segment. The appendix is normal caliber. There is moderate to severe stool retention in the ascending colon. Left colonic diverticula without evidence of diverticulitis Lymphatic: No adenopathy is seen through the metallic artifact, additional metal artifact of the pelvis from right hip arthroplasty. Reproductive: The uterus is absent. The bladder is partially obscured to the right but normal where visible. Other: There is no free air, hemorrhage or fluid Musculoskeletal:  T10-L4 posterior fusion and posterior bone grafting mantle, with discectomies and interbody bone plugs at L2-3 and L3-4 and with multilevel laminectomies. Right hip arthroplasty. There is osteopenia without evidence of acute skeletal abnormality. Review of the MIP images confirms the above findings. IMPRESSION: 1. No aortic aneurysm or dissection and only trace calcifications in the abdominal segment. 2. There is no significant visceral artery stenosis, but the right kidney is noted with asymmetric chronic volume loss and upper pole scarring as before. 3. Mild cardiomegaly with a right chamber predominance and a slight bowing of the interventricular septum to the left. This may be seen with right heart strain and there are prominent superior pulmonary veins as well, but no findings of pulmonary edema or pleural effusions. 4. Prior Nissen procedure with small hiatal hernia and patulous esophagus with fluid retention distally. 5. Nonspecific jejunal enteritis or enteropathy, and in the left lower abdomen mildly dilated small bowel is seen which could be due to an ileus or low-grade obstruction. There is no visible transitional segment. Constipation. 6. Prior cholecystectomy with a prominent common bile duct. Laboratory and clinical correlation advised. 7. Posterior  fusion and bone grafting T10-L4 with discectomies and interbody bone plugs at L2-3 and L3-4, appears chronic. 8. Additional findings described above. Electronically Signed   By: Telford Nab M.D.   On: 01/14/2021 03:06   ASSESSMENT AND PLAN:  [name] is a 60 y.o. Caucasian female with medical history significant for multiple medical problems including paroxysmal atrial fibrillation status postcardiac ablation in May of this year, asthma, anxiety and depression, who presents emergency room with the onset of palpitations with associated midsternal chest pain as well as significant nausea and epigastric abdominal pain.  Ileus/low-grade SBO/with  nausea and vomiting history of gastritis -- seen by surgery. NG tube removed since patient's nausea gotten worse. Not much NG output. -- Continue IV fluids, clear liquid, antibiotics -- patient did have a BM today passing gas.   Intractable nausea -- G.I. consultation with Dr. Marius Ditch -- IV Protonix BID -- IV fluids and PRN antiemetics -- patient had EGD in 2021-- showed small hiatal hernia, gastritis  atrial fibrillation with RVR acute on chronic chronic anticoagulation -- patient was on IV diltiazem drip now discontinued. Heart rate in the 70s.-- Seen by Dr. Rockey Situ from [facility] MG recommends continue present rate blocking agents -- patient has had ablation in May 2022 -- continue eliquis  Anxiety -- continue BuSpar and Xanax  chronic pain syndrome -- on morphine given through the pain clinic. Patient follows with Dr. Dossie Arbour  asthma without exacerbation -- continue Singulair    Procedures: Family communication : none Consults : G.I., surgery CODE STATUS: full DVT Prophylaxis : eliquis Level of care: Progressive Cardiac Status is: Inpatient  Remains inpatient appropriate because: nausea abdominal pain25        TOTAL TIME TAKING CARE OF THIS PATIENT: 25 minutes.  >50% time spent on counselling and coordination of care  Note: This dictation was prepared with Dragon dictation along with smaller phrase technology. Any transcriptional errors that result from this process are unintentional.  Fritzi Mandes M.D    [facility]   CC: Primary care physician; Virginia Crews, MD Patient ID: [name], female   DOB: 1960-11-29, 60 y.o.   MRN: [id number]

## 2021-01-14 NOTE — ED Notes (Signed)
This RN walked pt to bsc, pt had soft bm and urinated. Connected to lws after returning to bed, pt tolerated well, now c/o nausea. NAD, call light in reach, bed locked and low.

## 2021-01-14 NOTE — H&P (Addendum)
[place]   PATIENT NAME: [name]    MR#:  [id number]  DATE OF BIRTH:  1960/09/11  DATE OF ADMISSION:  01/13/2021  PRIMARY CARE PHYSICIAN: Virginia Crews, MD   Patient is coming from: Home  REQUESTING/REFERRING PHYSICIAN: Ward, Cyril Mourning, DO  CHIEF COMPLAINT:   Chief Complaint  Patient presents with   Chest Pain    HISTORY OF PRESENT ILLNESS:  [name] is a 60 y.o. Caucasian female with medical history significant for multiple medical problems that are mentioned below, including paroxysmal atrial fibrillation status postcardiac ablation in May of this year, asthma, anxiety and depression, who presents emergency room with the onset of palpitations with associated midsternal chest pain as well as mild dyspnea without cough or wheezing.  The patient admitted to epigastric abdominal pain as well as nausea with dry heaves.  She denied any dysuria, oliguria or hematuria or flank pain.  Last bowel movement was today.No diarrhea or melena or bright red bleeding per rectum.  ED Course: When she came to the ER blood pressure was 137/95 and heart rate 105 with otherwise normal vital signs.  Labs revealed hypokalemia of 2.9.  High-sensitivity troponin I was 11 and later 11.  Lactic acid was 1 and CBC was within normal.  Influenza antigens and COVID-19 PCR came back negative. EKG as reviewed by me : EKG showed atrial fibrillation with rapid ventricular sponsor 139 with Q waves laterally. Imaging: Chest x-ray showed no acute cardiopulmonary disease.  CT of the abdomen and pelvis revealed the following: 1. No aortic aneurysm or dissection and only trace calcifications in the abdominal segment. 2. There is no significant visceral artery stenosis, but the right kidney is noted with asymmetric chronic volume loss and upper pole scarring as before. 3. Mild cardiomegaly with a right chamber predominance and a slight bowing of the interventricular septum to the left. This may  be seen with right heart strain and there are prominent superior pulmonary veins as well, but no findings of pulmonary edema or pleural effusions. 4. Prior Nissen procedure with small hiatal hernia and patulous esophagus with fluid retention distally. 5. Nonspecific jejunal enteritis or enteropathy, and in the left lower abdomen mildly dilated small bowel is seen which could be due to an ileus or low-grade obstruction. There is no visible transitional segment. Constipation. 6. Prior cholecystectomy with a prominent common bile duct. Laboratory and clinical correlation advised. 7. Posterior fusion and bone grafting T10-L4 with discectomies and interbody bone plugs at L2-3 and L3-4, appears chronic. 8. Additional findings described above  Abdominal x-ray showed enteric feeding tube in place in the stomach with sidehole at the level of the fundus.  The patient was given 10 mg of IV diltiazem followed by IV diltiazem drip and when the patient converted to normal sinus rhythm she was given 300mg  diltiazem CD.  She was also given 5 mg of p.o. Eliquis, 1 mg of IV Ativan, 10 mg IV Reglan, 4 mg IV Zofran twice and 40 mill [place] of p.o. potassium chloride.  She will be admitted to a progressive care unit bed for further evaluation and management. PAST MEDICAL HISTORY:   Past Medical History:  Diagnosis Date   Abnormal EKG    HX OF INVERTED T WAVES ON EKG, PALPITATIONS, CHEST PAINS-CARDIAC WORK UP DID NOT SHOW ANY HEART DISEASE   AC (acromioclavicular) joint bone spurs    Acute postoperative pain 01/04/2017   Addison anemia 08/15/2004   Anemia  Iron Infusion-8 yrs ago   Anxiety    Asthma    Cephalalgia 08/18/2014   Cervical disc disease 08/18/2014   Needs neck surgery.    Chronic headaches    Chronic, continuous use of opioids    DDD (degenerative disc disease)    CERVICAL AND LUMBAR-CHRONIC PAIN, RT HIP LABRAL TEAR   Depression    PT STATES A LOT OF STRESS IN HER LIFE    Dissociative disorder    Dizziness 04/22/2013   Duodenal ulcer with hemorrhage and perforation ([place]) 04/27/2003   Foot drop, right    FROM BACK SURGERY   GERD (gastroesophageal reflux disease)    H/O arthrodesis 08/18/2014   Headache(784.0)    AND NECK PAIN--STATES RECENT TEST SHOW CERVICAL DEGENERATION   History of blood transfusion    s/p back surgery   History of cardiac cath    a. 05/2019 Cath: Nl cors. EF 55-65%.   History of cardioversion    History of cervical spinal surgery 01/04/2015   History of kidney stones    Hypertension    Inverted T wave    Mitral regurgitation    a. 07/2020 Echo: EF 60-65%, no rwma. Nl RV size/fxn. RVSP 39.68mmHg. Mildly to mod dil LA. Mod MR; b. 07/2020 TEE: EF 55-60%. Lambl's excresence. Nl RV size/fxn. Midly dil LA. No LA/LAA thrombus. Mild MR.   Narrowing of intervertebral disc space 08/18/2014   Currently on disability.    Orthostatic hypotension 04/22/2013   Pain    CHRONIC NECK AND BACK PAIN - LIMITED ROM NECK - S/P FUSIONS CERVICAL AND LUMBAR   Paroxysmal atrial fibrillation ([place]) 2021   a. 07/2020 s/p PVI.   Pneumonia    PONV (postoperative nausea and vomiting)    PT GIVES HX OF N&V AND FEVER WITH SURGERIES YEARS AGO--BUT NO PROBLEMS WITH MORE RECENT SURGERIES--STATES NOT MALIGNANT HYPERTHERMIA   Postop Hyponatremia 05/14/2012   Postoperative anemia due to acute blood loss 05/14/2012   PTSD (post-traumatic stress disorder)    Right foot drop    Right hip arthralgia 08/18/2014   Status post surgery of right, and now needs left.    Sleep apnea 2021   does not have a cpap   Therapeutic opioid-induced constipation ([facility])    Typical atrial flutter (H[facility])     PAST SURGICAL HISTORY:   Past Surgical History:  Procedure Laterality Date   ABDOMINAL HYSTERECTOMY     afib  05/2019   ANTERIOR CERVICAL DECOMP/DISCECTOMY FUSION N/A 10/30/2012   Procedure: ACDF C5-6, EXPLORATION AND HARDWARE REMOVAL C6-7;  Surgeon: Melina Schools, MD;  Location: [place];  Service: Orthopedics;  Laterality: N/A;   ANTERIOR FUSION CERVICAL SPINE  MAY 2012   AT [place] N/A 08/06/2020   Procedure: ATRIAL FIBRILLATION ABLATION;  Surgeon: Vickie Epley, MD;  Location: [facility];  Service: Cardiovascular;  Laterality: N/A;   BACK SURGERY  2009   LUMBAR FUSION WITH RODS    BREAST BIOPSY Right 2008   benign.- bx/clip   BREAST EXCISIONAL BIOPSY Left 1998   benign   BREAST REDUCTION SURGERY Bilateral 06/2016   CARDIAC ELECTROPHYSIOLOGY MAPPING AND ABLATION     CARDIOVERSION N/A 12/03/2019   Procedure: CARDIOVERSION;  Surgeon: Kate Sable, MD;  Location: [facility];  Service: Cardiovascular;  Laterality: N/A;   CARPAL TUNNEL RELEASE  05-06-12   Right   CHOLECYSTECTOMY     COLONOSCOPY WITH PROPOFOL N/A 01/26/2017   Procedure: COLONOSCOPY WITH PROPOFOL;  Surgeon: Allen Norris,  Darren, MD;  Location: [place];  Service: Endoscopy;  Laterality: N/A;   DIAGNOSTIC LAPAROSCOPIES - MULTIPLE FOR ENDOMETRIOSIS     ESOPHAGEAL DILATION N/A 01/26/2017   Procedure: ESOPHAGEAL DILATION;  Surgeon: Lucilla Lame, MD;  Location: [place];  Service: Endoscopy;  Laterality: N/A;   ESOPHAGEAL MANOMETRY N/A 05/30/2017   Procedure: ESOPHAGEAL MANOMETRY (EM);  Surgeon: Lucilla Lame, MD;  Location: [facility];  Service: Endoscopy;  Laterality: N/A;   ESOPHAGOGASTRODUODENOSCOPY (EGD) WITH PROPOFOL N/A 01/26/2017   Procedure: ESOPHAGOGASTRODUODENOSCOPY (EGD) WITH PROPOFOL;  Surgeon: Lucilla Lame, MD;  Location: [place];  Service: Endoscopy;  Laterality: N/A;   ESOPHAGOGASTRODUODENOSCOPY (EGD) WITH PROPOFOL N/A 01/30/2020   Procedure: ESOPHAGOGASTRODUODENOSCOPY (EGD) WITH PROPOFOL;  Surgeon: Lucilla Lame, MD;  Location: [place];  Service: Endoscopy;  Laterality: N/A;  sleep apnea COVID + 01-15-20   HIP ARTHROSCOPY  09/20/2011   Procedure: ARTHROSCOPY HIP;  Surgeon: Gearlean Alf, MD;  Location: [facility];   Service: Orthopedics;  Laterality: Right;  Right Hip Scope with Labral Debridement   LEFT HEART CATH AND CORONARY ANGIOGRAPHY Left 06/10/2019   Procedure: LEFT HEART CATH AND CORONARY ANGIOGRAPHY;  Surgeon: Nelva Bush, MD;  Location: [facility] [facility];  Service: Cardiovascular;  Laterality: Left;   NASAL SEPTUM SURGERY  MARCH 2013   IN [place]   [facility] IMPEDANCE STUDY N/A 05/30/2017   Procedure: [place] IMPEDANCE STUDY;  Surgeon: Lucilla Lame, MD;  Location: [facility];  Service: Endoscopy;  Laterality: N/A;   POLYPECTOMY N/A 01/26/2017   Procedure: POLYPECTOMY;  Surgeon: Lucilla Lame, MD;  Location: [place];  Service: Endoscopy;  Laterality: N/A;   POSTERIOR CERVICAL FUSION/FORAMINOTOMY N/A 04/16/2013   Procedure: REMOVAL CERVICAL PLATES AND INTERBODY CAGE/POSTERIOR CERVICAL SPINAL FUSION C4 - C6/C5 CORPECTOMY/C4 - C6 FUSION WITH ILIAC CREST BONE GRAFT;  Surgeon: Melina Schools, MD;  Location: [place];  Service: Orthopedics;  Laterality: N/A;   RADIOFREQUENCY ABLATION NERVES     REDUCTION MAMMAPLASTY Bilateral 05/2016   RIGHT HIP ARTHROSCOPY FOR LABRAL TEAR  ABOUT 2010   2012 also   SHOULDER ARTHROSCOPY  05-06-12   bone spur   TEE WITHOUT CARDIOVERSION N/A 08/05/2020   Procedure: TRANSESOPHAGEAL ECHOCARDIOGRAM (TEE);  Surgeon: Lelon Perla, MD;  [facility]: [facility];  Service: Cardiovascular;  Laterality: N/A;   TONSILLECTOMY     AS A CHILD   TOTAL HIP ARTHROPLASTY Right 05/13/2012   Procedure: TOTAL HIP ARTHROPLASTY ANTERIOR APPROACH;  Surgeon: Gearlean Alf, MD;  Location: [facility];  Service: Orthopedics;  Laterality: Right;    SOCIAL HISTORY:   Social History   Tobacco Use   Smoking status: Never   Smokeless tobacco: Never  Substance Use Topics   Alcohol use: No    Alcohol/week: 0.0 standard drinks    FAMILY HISTORY:   Family History  Problem Relation Age of Onset   Aneurysm Mother    Aneurysm Maternal Grandmother    Breast cancer Paternal Grandmother 29    Bipolar disorder Sister    Bipolar disorder Grandchild    Anxiety disorder Grandchild    Depression Grandchild     DRUG ALLERGIES:   Allergies  Allergen Reactions   Amoxicillin Hives    She did ok w ANCEF   Chlorhexidine Gluconate Dermatitis and Hives   Clindamycin Hives   Codeine Hives   Erythromycin Hives    "mycins" in general   Penicillin G Hives    "cillins" in general   Sulfa Antibiotics Nausea And Vomiting and Hives  Levofloxacin Hives   Shellfish Allergy Hives   Decadron [Dexamethasone] Other (See Comments)    Hot flashes, insomnia, "manic" Hot flashes, insomnia, "manic"   Mangifera Indica Hives    papaya   Papaya Derivatives Hives   Betadine [Povidone Iodine] Hives        Clarithromycin Hives   Other Hives     Mango    Povidone-Iodine Hives   Prednisone Anxiety    High blood pressure, flushed, mood changes, heart palpitations High blood pressure, flushed, mood changes, heart palpitations    REVIEW OF SYSTEMS:   ROS As per history of present illness. All pertinent systems were reviewed above. Constitutional, HEENT, cardiovascular, respiratory, GI, GU, musculoskeletal, neuro, psychiatric, endocrine, integumentary and hematologic systems were reviewed and are otherwise negative/unremarkable except for positive findings mentioned above in the HPI.   MEDICATIONS AT HOME:   Prior to Admission medications   Medication Sig Start Date End Date Taking? Authorizing Provider  acetaminophen (TYLENOL) 500 MG tablet Take 500-1,000 mg by mouth every 6 (six) hours as needed for moderate pain.    Yes [provider]  ALPRAZolam Duanne Moron) 1 MG tablet Take 1 mg by mouth at bedtime.  07/04/19  Yes [provider]  apixaban (ELIQUIS) 5 MG TABS tablet Take 1 tablet (5 mg total) by mouth 2 (two) times daily. 10/25/20  Yes Agbor-Etang, Aaron Edelman, MD  busPIRone (BUSPAR) 7.5 MG tablet Take 7.5 mg by mouth 2 (two) times daily. 05/06/20  Yes [provider]  celecoxib (CELEBREX) 200 MG capsule Take 1 capsule by mouth 2 (two) times daily. 12/20/20 01/19/21 Yes [provider]  dicyclomine (BENTYL) 20 MG tablet TAKE 1 TABLET (20 MG TOTAL) BY MOUTH 3 (THREE) TIMES DAILY BEFORE MEALS. 11/18/20  Yes Lucilla Lame, MD  diltiazem (CARDIZEM CD) 300 MG 24 hr capsule Take 1 capsule (300 mg total) by mouth daily. 01/13/21  Yes Nena Polio, MD  LINZESS 290 MCG CAPS capsule TAKE 1 CAPSULE BY MOUTH DAILY BEFORE BREAKFAST. 07/19/20  Yes Lucilla Lame, MD  Menthol, Topical Analgesic, (BENGAY EX) Apply 1 application topically daily as needed (pain).   Yes [provider]  montelukast (SINGULAIR) 10 MG tablet TAKE 1 TABLET BY MOUTH EVERY DAY 03/28/20  Yes Bacigalupo, Dionne Bucy, MD  morphine (MSIR) 15 MG tablet Take 1 tablet (15 mg total) by mouth every 6 (six) hours as needed for moderate pain or severe pain. Must last 30 days. 12/17/20 01/16/21 Yes Milinda Pointer, MD  naphazoline-pheniramine (VISINE) 0.025-0.3 % ophthalmic solution 1 drop 4 (four) times daily as needed for eye irritation.   Yes [provider]  pantoprazole (PROTONIX) 40 MG tablet TAKE 1 TABLET BY MOUTH TWICE A DAY 05/18/20  Yes Lucilla Lame, MD  polyethylene glycol (MIRALAX / GLYCOLAX) 17 g packet Take 17 g by mouth daily.   Yes [provider]  sodium chloride (OCEAN) 0.65 % SOLN nasal spray Place 1 spray into both nostrils daily as needed for congestion ([place]).   Yes [provider]  morphine (MSIR) 15 MG tablet Take 1 tablet (15 mg total) by mouth every 6 (six) hours as needed for moderate pain or severe pain. Must last 30 days. 11/17/20 12/17/20  Milinda Pointer, MD  morphine (MSIR) 15 MG tablet Take 1 tablet (15 mg total) by mouth every 6 (six) hours as needed for moderate pain or severe pain. Must last 30 days. 01/16/21 02/15/21  Milinda Pointer, MD  morphine (MSIR) 15 MG tablet Take 1 tablet (15 mg  total) by mouth every 6 (six) hours as needed for  moderate pain or severe pain. Must last 30 days. 02/15/21 03/17/21  Milinda Pointer, MD  valACYclovir (VALTREX) 1000 MG tablet TAKE TWO TABLETS BY MOUTH TWICE DAILY FOR ONE DAY FEVER BLISTER 03/02/20   Virginia Crews, MD      VITAL SIGNS:  Blood pressure 128/83, pulse 75, temperature 98.1 F (36.7 C), temperature source Oral, resp. rate 13, height 5\' 4"  (1.626 m), weight 65.3 kg, SpO2 100 %.  PHYSICAL EXAMINATION:  Physical Exam  GENERAL:  60 y.o.-year-old Caucasian female patient lying in the bed with no acute distress.  EYES: Pupils equal, round, reactive to light and accommodation. No scleral icterus. Extraocular muscles intact.  HEENT: Head atraumatic, normocephalic. Oropharynx and nasopharynx clear.  NECK:  Supple, no jugular venous distention. No thyroid enlargement, no tenderness.  LUNGS: Normal breath sounds bilaterally, no wheezing, rales,rhonchi or crepitation. No use of accessory muscles of respiration.  CARDIOVASCULAR: Regular rate and rhythm, S1, S2 normal. No murmurs, rubs, or gallops.  ABDOMEN: Soft, nondistended, with mild epigastric tenderness without rebound tenderness guarding or rigidity. Bowel sounds are significantly diminished.. No organomegaly or mass.  EXTREMITIES: No pedal edema, cyanosis, or clubbing.  NEUROLOGIC: Cranial nerves II through XII are intact. Muscle strength 5/5 in all extremities. Sensation intact. Gait not checked.  PSYCHIATRIC: The patient is alert and oriented x 3.  Normal affect and good eye contact. SKIN: No obvious rash, lesion, or ulcer.   LABORATORY PANEL:   CBC Recent Labs  Lab 01/13/21 2014  WBC 8.3  HGB 12.7  HCT 38.6  PLT 353   ------------------------------------------------------------------------------------------------------------------  Chemistries  Recent Labs  Lab 01/13/21 2014 01/14/21 0150  NA 138  --   K 2.9*  --   CL 102  --   CO2 26  --   GLUCOSE 144*  --   BUN 12  --   CREATININE 1.07*  --    CALCIUM 9.1  --   MG  --  2.0  AST 21  --   ALT 11  --   ALKPHOS 108  --   BILITOT 0.8  --    ------------------------------------------------------------------------------------------------------------------  Cardiac Enzymes No results for input(s): TROPONINI in the last 168 hours. ------------------------------------------------------------------------------------------------------------------  RADIOLOGY:  DG Chest Portable 1 View  Result Date: 01/13/2021 CLINICAL DATA:  Palpitations and chest pain. EXAM: PORTABLE CHEST 1 VIEW COMPARISON:  February 27, 2020 FINDINGS: The heart size and mediastinal contours are within normal limits. Both lungs are clear. Stable postoperative changes are seen within the mid to lower cervical spine, lower thoracic spine and upper lumbar spine. IMPRESSION: Stable exam without acute cardiopulmonary disease. Electronically Signed   By: Virgina Norfolk M.D.   On: 01/13/2021 20:33   CT Angio Chest/Abd/Pel for Dissection W and/or Wo Contrast  Result Date: 01/14/2021 CLINICAL DATA:  Prior Nissen fundoplication with patulous esophagus with fluid retention in the distal esophagus. EXAM: CT ANGIOGRAPHY CHEST, ABDOMEN AND PELVIS TECHNIQUE: Non-contrast CT of the chest was initially obtained. Multidetector CT imaging through the chest, abdomen and pelvis was performed using the standard protocol during bolus administration of intravenous contrast. Multiplanar reconstructed images and MIPs were obtained and reviewed to evaluate the vascular anatomy. CONTRAST:  1mL OMNIPAQUE IOHEXOL 350 MG/ML SOLN COMPARISON:  Noncontrast abdomen and pelvis CTs 05/01/2020 and 08/28/2016. No prior chest CT for comparison FINDINGS: CTA CHEST FINDINGS Cardiovascular: There is mild panchamber cardiomegaly but with a slight right chamber predominance and bowing of the interventricular  septum towards the left which may be seen with right heart strain. There is distension of the superior  pulmonary veins as well. There is no pericardial effusion, no pulmonary arterial dilatation or central embolus. There are no appreciable coronary artery calcifications. No aortic aneurysm or dissection is seen and no significant visible plaques with mild descending tortuosity. The great vessels are normal. Mediastinum/Nodes: No enlarged mediastinal, hilar, or axillary lymph nodes. Thyroid gland, trachea, demonstrate no significant findings. There is a mildly patulous esophagus with scattered fluid in the distal esophagus and a small hiatus hernia. Lungs/Pleura: The lungs are clear of infiltrates. There are scattered linear scar-like opacities in both bases. There is no pleural effusion, thickening or pneumothorax. Central airways in the trachea are clear. Musculoskeletal: There is fusion plating partially visible in the lower cervical spine. Multilevel Harrington rod fusion begins at T10. There is no worrisome regional skeletal lesion. Review of the MIP images confirms the above findings. CTA ABDOMEN AND PELVIS FINDINGS VASCULAR Aorta: Normal caliber aorta without aneurysm, dissection, vasculitis or significant stenosis. There are scattered trace wall calcifications. Celiac: Patent without evidence of aneurysm, dissection, vasculitis or significant stenosis. SMA: Patent without evidence of aneurysm, dissection, vasculitis or significant stenosis. Renals: There are bilateral single renal arteries and neither demonstrate significant plaques or stenosis. IMA: Normal. Inflow: Common iliac, both internal and external iliac arteries are clear. Veins: The SVC is well opacified but the IVC and iliac veins are unopacified at the time of imaging. The portal vein is fairly well opacified and normal caliber. Review of the MIP images confirms the above findings. NON-VASCULAR: Abdominal viscera not optimally visualized due to extensive metal artifact from multilevel posterior rod fusion, T10-L4. Hepatobiliary: No obvious mass  enhancement in the liver. Gallbladder is absent and the common bile duct is dilated measuring 12 mm without a visible obvious filling defect Pancreas: Partially atrophic, otherwise grossly unremarkable. Spleen: No enlargement or mass enhancement. Adrenals/Urinary Tract: There is no adrenal mass. Asymmetric right renal cortical volume loss and upper pole cortical scarring are again shown. There is no renal mass, calculus or hydronephrosis Stomach/Bowel: Small hiatal hernia and evidence of Nissen fundoplication. Mucosal thickening in the jejunal segments in the left abdomen is noted. There is mild dilatation of the left lower quadrant small bowel to up to 2.7 cm and relatively decompressed pelvic and right abdominal small bowel but no visible transitional segment. The appendix is normal caliber. There is moderate to severe stool retention in the ascending colon. Left colonic diverticula without evidence of diverticulitis Lymphatic: No adenopathy is seen through the metallic artifact, additional metal artifact of the pelvis from right hip arthroplasty. Reproductive: The uterus is absent. The bladder is partially obscured to the right but normal where visible. Other: There is no free air, hemorrhage or fluid Musculoskeletal: T10-L4 posterior fusion and posterior bone grafting mantle, with discectomies and interbody bone plugs at L2-3 and L3-4 and with multilevel laminectomies. Right hip arthroplasty. There is osteopenia without evidence of acute skeletal abnormality. Review of the MIP images confirms the above findings. IMPRESSION: 1. No aortic aneurysm or dissection and only trace calcifications in the abdominal segment. 2. There is no significant visceral artery stenosis, but the right kidney is noted with asymmetric chronic volume loss and upper pole scarring as before. 3. Mild cardiomegaly with a right chamber predominance and a slight bowing of the interventricular septum to the left. This may be seen with right  heart strain and there are prominent superior pulmonary veins as well, but  no findings of pulmonary edema or pleural effusions. 4. Prior Nissen procedure with small hiatal hernia and patulous esophagus with fluid retention distally. 5. Nonspecific jejunal enteritis or enteropathy, and in the left lower abdomen mildly dilated small bowel is seen which could be due to an ileus or low-grade obstruction. There is no visible transitional segment. Constipation. 6. Prior cholecystectomy with a prominent common bile duct. Laboratory and clinical correlation advised. 7. Posterior fusion and bone grafting T10-L4 with discectomies and interbody bone plugs at L2-3 and L3-4, appears chronic. 8. Additional findings described above. Electronically Signed   By: Telford Nab M.D.   On: 01/14/2021 03:06      IMPRESSION AND PLAN:  Active Problems:   Ileus ([place])  1.  Paroxysmal atrial fibrillation with RVR.  The patient is status postcardiac ablation in May of this year. - The patient will be admitted to a PCU bed - We will continue her on p.o. Cardizem CD. - We will continue her Eliquis for now. - Cardiology consult to be obtained. - I notified Dr. Rockey Situ about the patient.  2.  Ileus/low-grade small bowel obstruction. - The patient be kept n.p.o. and hydrated with IV normal saline. - We will aggressively replace her potassium. - We will follow two-view abdomen x-ray later today.  3.  Hypokalemia. - This could be contributing to her A. fib with RVR as well as ileus. - Aggressive potassium replacement will be pursued. - Potassium will be followed. - Magnesium was normal at 2.  4.  Anxiety. - We will continue her BuSpar and Xanax.  5.  Asthma without exacerbation. - We will continue her Singulair.  6.  IBS. - We will continue Linzess and Bentyl.  7.  GERD. - We will continue PPI therapy.  DVT prophylaxis: Eliquis Code Status: full code. Family Communication:  The plan of care was discussed in  details with the patient (and family). I answered all questions. The patient agreed to proceed with the above mentioned plan. Further management will depend upon hospital course. Disposition Plan: Back to previous home environment Consults called: Cardiology and general surgery. All the records are reviewed and case discussed with ED provider.  Status is: Inpatient  Remains inpatient appropriate because:Ongoing diagnostic testing needed not appropriate for outpatient work up, Unsafe d/c plan, IV treatments appropriate due to intensity of illness or inability to take PO, and Inpatient level of care appropriate due to severity of illness   Dispo: The patient is from: Home              Anticipated d/c is to: Home              Patient currently is not medically stable to d/c.              Difficult to place patient: No  TOTAL TIME TAKING CARE OF THIS PATIENT: 55 minutes.     Christel Mormon M.D on 01/14/2021 at 4:48 AM  [facility]   From 7 PM-7 AM, contact night-coverage [email or web address]  CC: Primary care physician; Virginia Crews, MD

## 2021-01-14 NOTE — Consult Note (Signed)
Cephas Darby, MD [address]  [place]  [place], [place] [address]  Main: [phone]  Fax: [phone]    Gastroenterology Consultation  Referring Provider:     No ref. provider found Primary Care Physician:  Virginia Crews, MD Primary Gastroenterologist:  Dr. Lucilla Lame Reason for Consultation:     Ileus, nausea and vomiting        HPI:   [name] is a 60 y.o. female referred by Dr. Virginia Crews, MD  for consultation & management of ileus, nausea and vomiting Patient has history of paroxysmal A. fib/flutter s/p ablation on 08/06/2020, sinus bradycardia, hypertension, asthma, GERD s/p robotic assisted laparoscopic paraesophageal hernia repair with Nissen's fundoplication in [id number]/2836 by Dr. Dahlia Byes, history of OSA.  Patient presented to [facility] on 11/3 secondary to palpitations.  Followed by this, she developed severe abdominal discomfort associated with nausea.  Patient underwent CT angio chest, abdomen pelvis to evaluate for dissection, there was no evidence of aortic aneurysm or dissection.  There was no evidence of significant visceral artery stenosis.  There was presence of nonspecific jejunal enteritis or enteropathy and small bowel ileus without a transition point.  Patient is status postcholecystectomy.  There was presence of pancreatic atrophy.  Patient had NG tube placed overnight, with minimal output.  Patient had a bowel movement and continues passing flatus today. She reports 1 week history of without BM since last Friday until this morning. She reports diarrhea a week prior. She had chicken salad yesterday, followed by her GI symptoms. She has to take linzess 223mcg along with miralax daily. She said she didn't take these meds during episode of diarrhea. Pt is still not able to tolerate liquids by mouth. Pt also reports mild intermittent rlq discomfort past 79month   NSAIDs: None  Antiplts/Anticoagulants/Anti thrombotics: Eliquis for history of A.  fib  GI Procedures: EGD 01/30/2020 - Small hiatal hernia. - Z-line irregular, in the lower third of the esophagus. Biopsied. Rule oiut short segment Barrett's. - Gastritis. Biopsied. - Normal examined duodenum. DIAGNOSIS:  A. STOMACH, ANTRUM; COLD BIOPSY:  - GASTRIC ANTRAL MUCOSA WITH MILD REACTIVE FOVEOLAR HYPERPLASIA.  - NEGATIVE FOR H. PYLORI, DYSPLASIA, AND MALIGNANCY.   B. GASTROESOPHAGEAL JUNCTION; COLD BIOPSY:  - SQUAMOCOLUMNAR MUCOSA WITH FEATURES OF MILD REFLUX GASTROESOPHAGITIS.  - NEGATIVE FOR INTESTINAL METAPLASIA, DYSPLASIA, AND MALIGNANCY.   Colonoscopy 01/26/2017 - One 3 mm polyp in the sigmoid colon, removed with a cold biopsy forceps. Resected and retrieved. - Non-bleeding internal hemorrhoids.  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    Chronic headaches    Chronic, continuous use of opioids    DDD (degenerative disc disease)    CERVICAL AND LUMBAR-CHRONIC PAIN, RT HIP LABRAL TEAR   Depression    PT STATES A LOT OF STRESS IN HER LIFE   Dissociative disorder    Dizziness 04/22/2013   Duodenal ulcer with hemorrhage and perforation ([place]) 04/27/2003   Foot drop, right    FROM BACK SURGERY   GERD (gastroesophageal reflux disease)  H/O arthrodesis 08/18/2014   Headache(784.0)    AND NECK PAIN--STATES RECENT TEST SHOW CERVICAL DEGENERATION   History of blood transfusion    s/p back surgery   History of cardiac cath    a. 05/2019 Cath: Nl cors. EF 55-65%.   History of cardioversion    History of cervical spinal surgery 01/04/2015   History of kidney stones    Hypertension    Inverted T  wave    Mitral regurgitation    a. 07/2020 Echo: EF 60-65%, no rwma. Nl RV size/fxn. RVSP 39.44mmHg. Mildly to mod dil LA. Mod MR; b. 07/2020 TEE: EF 55-60%. Lambl's excresence. Nl RV size/fxn. Midly dil LA. No LA/LAA thrombus. Mild MR.   Narrowing of intervertebral disc space 08/18/2014   Currently on disability.    Orthostatic hypotension 04/22/2013   Pain    CHRONIC NECK AND BACK PAIN - LIMITED ROM NECK - S/P FUSIONS CERVICAL AND LUMBAR   Paroxysmal atrial fibrillation ([place]) 2021   a. 07/2020 s/p PVI.   Pneumonia    PONV (postoperative nausea and vomiting)    PT GIVES HX OF N&V AND FEVER WITH SURGERIES YEARS AGO--BUT NO PROBLEMS WITH MORE RECENT SURGERIES--STATES NOT MALIGNANT HYPERTHERMIA   Postop Hyponatremia 05/14/2012   Postoperative anemia due to acute blood loss 05/14/2012   PTSD (post-traumatic stress disorder)    Right foot drop    Right hip arthralgia 08/18/2014   Status post surgery of right, and now needs left.    Sleep apnea 2021   does not have a cpap   Therapeutic opioid-induced constipation (OIC)    Typical atrial flutter [facility])     Past Surgical History:  Procedure Laterality Date   ABDOMINAL HYSTERECTOMY     afib  05/2019   ANTERIOR CERVICAL DECOMP/DISCECTOMY FUSION N/A 10/30/2012   Procedure: ACDF C5-6, EXPLORATION AND HARDWARE REMOVAL C6-7;  Surgeon: Melina Schools, MD;  Location: [place];  Service: Orthopedics;  Laterality: N/A;   ANTERIOR FUSION CERVICAL SPINE  MAY 2012   AT [place] N/A 08/06/2020   Procedure: ATRIAL FIBRILLATION ABLATION;  Surgeon: Vickie Epley, MD;  Location: [place] [facility];  Service: Cardiovascular;  Laterality: N/A;   BACK SURGERY  2009   LUMBAR FUSION WITH RODS    BREAST BIOPSY Right 2008   benign.- bx/clip   BREAST EXCISIONAL BIOPSY Left 1998   benign   BREAST REDUCTION SURGERY Bilateral 06/2016   CARDIAC ELECTROPHYSIOLOGY MAPPING AND ABLATION     CARDIOVERSION N/A 12/03/2019   Procedure:  CARDIOVERSION;  Surgeon: Kate Sable, MD;  Location: [facility];  Service: Cardiovascular;  Laterality: N/A;   CARPAL TUNNEL RELEASE  05-06-12   Right   CHOLECYSTECTOMY     COLONOSCOPY WITH PROPOFOL N/A 01/26/2017   Procedure: COLONOSCOPY WITH PROPOFOL;  Surgeon: Lucilla Lame, MD;  Location: [place];  Service: Endoscopy;  Laterality: N/A;   DIAGNOSTIC LAPAROSCOPIES - MULTIPLE FOR ENDOMETRIOSIS     ESOPHAGEAL DILATION N/A 01/26/2017   Procedure: ESOPHAGEAL DILATION;  Surgeon: Lucilla Lame, MD;  Location: [place];  Service: Endoscopy;  Laterality: N/A;   ESOPHAGEAL MANOMETRY N/A 05/30/2017   Procedure: ESOPHAGEAL MANOMETRY (EM);  Surgeon: Lucilla Lame, MD;  Location: [facility];  Service: Endoscopy;  Laterality: N/A;   ESOPHAGOGASTRODUODENOSCOPY (EGD) WITH PROPOFOL N/A 01/26/2017   Procedure: ESOPHAGOGASTRODUODENOSCOPY (EGD) WITH PROPOFOL;  Surgeon: Lucilla Lame, MD;  Location: [place];  Service: Endoscopy;  Laterality: N/A;   ESOPHAGOGASTRODUODENOSCOPY (EGD) WITH PROPOFOL N/A 01/30/2020  Procedure: ESOPHAGOGASTRODUODENOSCOPY (EGD) WITH PROPOFOL;  Surgeon: Lucilla Lame, MD;  Location: [place];  Service: Endoscopy;  Laterality: N/A;  sleep apnea COVID + 01-15-20   HIP ARTHROSCOPY  09/20/2011   Procedure: ARTHROSCOPY HIP;  Surgeon: Gearlean Alf, MD;  Location: [facility];  Service: Orthopedics;  Laterality: Right;  Right Hip Scope with Labral Debridement   LEFT HEART CATH AND CORONARY ANGIOGRAPHY Left 06/10/2019   Procedure: LEFT HEART CATH AND CORONARY ANGIOGRAPHY;  Surgeon: Nelva Bush, MD;  Location: [place] [facility];  Service: Cardiovascular;  Laterality: Left;   NASAL SEPTUM SURGERY  MARCH 2013   IN [place]   [facility] IMPEDANCE STUDY N/A 05/30/2017   Procedure: [place] IMPEDANCE STUDY;  Surgeon: Lucilla Lame, MD;  Location: [facility];  Service: Endoscopy;  Laterality: N/A;   POLYPECTOMY N/A 01/26/2017   Procedure: POLYPECTOMY;   Surgeon: Lucilla Lame, MD;  Location: [place];  Service: Endoscopy;  Laterality: N/A;   POSTERIOR CERVICAL FUSION/FORAMINOTOMY N/A 04/16/2013   Procedure: REMOVAL CERVICAL PLATES AND INTERBODY CAGE/POSTERIOR CERVICAL SPINAL FUSION C4 - C6/C5 CORPECTOMY/C4 - C6 FUSION WITH ILIAC CREST BONE GRAFT;  Surgeon: Melina Schools, MD;  Location: [place];  Service: Orthopedics;  Laterality: N/A;   RADIOFREQUENCY ABLATION NERVES     REDUCTION MAMMAPLASTY Bilateral 05/2016   RIGHT HIP ARTHROSCOPY FOR LABRAL TEAR  ABOUT 2010   2012 also   SHOULDER ARTHROSCOPY  05-06-12   bone spur   TEE WITHOUT CARDIOVERSION N/A 08/05/2020   Procedure: TRANSESOPHAGEAL ECHOCARDIOGRAM (TEE);  Surgeon: Lelon Perla, MD;  Location: [facility];  Service: Cardiovascular;  Laterality: N/A;   TONSILLECTOMY     AS A CHILD   TOTAL HIP ARTHROPLASTY Right 05/13/2012   Procedure: TOTAL HIP ARTHROPLASTY ANTERIOR APPROACH;  Surgeon: Gearlean Alf, MD;  Location: [facility];  Service: Orthopedics;  Laterality: Right;     Current Facility-Administered Medications:    0.9 % NaCl with KCl 40 mEq / L  infusion, , Intravenous, Continuous, Mansy, Jan A, MD, Last Rate: 100 mL/hr at 01/14/21 0604, New Bag at 01/14/21 0604   ALPRAZolam Duanne Moron) tablet 1 mg, 1 mg, Oral, QHS, Mansy, Jan A, MD   busPIRone (BUSPAR) tablet 7.5 mg, 7.5 mg, Oral, BID, Mansy, Jan A, MD, 7.5 mg at 01/14/21 1215   celecoxib (CELEBREX) capsule 200 mg, 200 mg, Oral, BID, Mansy, Jan A, MD   diltiazem (CARDIZEM CD) 24 hr capsule 120 mg, 120 mg, Oral, Daily, Mansy, Jan A, MD, 120 mg at 01/14/21 1216   linaclotide (LINZESS) capsule 290 mcg, 290 mcg, Oral, QAC breakfast, Mansy, Jan A, MD   montelukast (SINGULAIR) tablet 10 mg, 10 mg, Oral, Daily, Mansy, Jan A, MD, 10 mg at 01/14/21 1216   morphine (MSIR) tablet 15 mg, 15 mg, Oral, Q6H PRN, Mansy, Jan A, MD, 15 mg at 01/14/21 1219   pantoprazole (PROTONIX) injection 40 mg, 40 mg, Intravenous, Q12H, Fritzi Mandes, MD, 40 mg  at 01/14/21 1219   polyethylene glycol (MIRALAX / GLYCOLAX) packet 17 g, 17 g, Oral, Daily, Mansy, Jan A, MD   promethazine (PHENERGAN) 12.5 mg in sodium chloride 0.9 % 50 mL IVPB, 12.5 mg, Intravenous, BID PRN, Fritzi Mandes, MD   sodium chloride (OCEAN) 0.65 % nasal spray 1 spray, 1 spray, Each Nare, Daily PRN, Mansy, Arvella Merles, MD  Current Outpatient Medications:    acetaminophen (TYLENOL) 500 MG tablet, Take 500-1,000 mg by mouth every 6 (six) hours as needed for moderate pain. , Disp: , Rfl:  ALPRAZolam (XANAX) 1 MG tablet, Take 1 mg by mouth at bedtime. , Disp: , Rfl:    apixaban (ELIQUIS) 5 MG TABS tablet, Take 1 tablet (5 mg total) by mouth 2 (two) times daily., Disp: 60 tablet, Rfl: 5   busPIRone (BUSPAR) 7.5 MG tablet, Take 7.5 mg by mouth 2 (two) times daily., Disp: , Rfl:    celecoxib (CELEBREX) 200 MG capsule, Take 1 capsule by mouth 2 (two) times daily., Disp: , Rfl:    dicyclomine (BENTYL) 20 MG tablet, TAKE 1 TABLET (20 MG TOTAL) BY MOUTH 3 (THREE) TIMES DAILY BEFORE MEALS., Disp: 270 tablet, Rfl: 1   diltiazem (CARDIZEM CD) 300 MG 24 hr capsule, Take 1 capsule (300 mg total) by mouth daily., Disp: 30 capsule, Rfl: 1   LINZESS 290 MCG CAPS capsule, TAKE 1 CAPSULE BY MOUTH DAILY BEFORE BREAKFAST., Disp: 30 capsule, Rfl: 6   Menthol, Topical Analgesic, (BENGAY EX), Apply 1 application topically daily as needed (pain)., Disp: , Rfl:    montelukast (SINGULAIR) 10 MG tablet, TAKE 1 TABLET BY MOUTH EVERY DAY, Disp: 90 tablet, Rfl: 1   morphine (MSIR) 15 MG tablet, Take 1 tablet (15 mg total) by mouth every 6 (six) hours as needed for moderate pain or severe pain. Must last 30 days., Disp: 120 tablet, Rfl: 0   naphazoline-pheniramine (VISINE) 0.025-0.3 % ophthalmic solution, 1 drop 4 (four) times daily as needed for eye irritation., Disp: , Rfl:    pantoprazole (PROTONIX) 40 MG tablet, TAKE 1 TABLET BY MOUTH TWICE A DAY, Disp: 180 tablet, Rfl: 3   polyethylene glycol (MIRALAX / GLYCOLAX) 17 g  packet, Take 17 g by mouth daily., Disp: , Rfl:    sodium chloride (OCEAN) 0.65 % SOLN nasal spray, Place 1 spray into both nostrils daily as needed for congestion (Dryness)., Disp: , Rfl:    morphine (MSIR) 15 MG tablet, Take 1 tablet (15 mg total) by mouth every 6 (six) hours as needed for moderate pain or severe pain. Must last 30 days., Disp: 120 tablet, Rfl: 0   [START ON 01/16/2021] morphine (MSIR) 15 MG tablet, Take 1 tablet (15 mg total) by mouth every 6 (six) hours as needed for moderate pain or severe pain. Must last 30 days., Disp: 120 tablet, Rfl: 0   [START ON 02/15/2021] morphine (MSIR) 15 MG tablet, Take 1 tablet (15 mg total) by mouth every 6 (six) hours as needed for moderate pain or severe pain. Must last 30 days., Disp: 120 tablet, Rfl: 0   valACYclovir (VALTREX) 1000 MG tablet, TAKE TWO TABLETS BY MOUTH TWICE DAILY FOR ONE DAY FEVER BLISTER, Disp: 4 tablet, Rfl: 5  Family History  Problem Relation Age of Onset   Aneurysm Mother    Aneurysm Maternal Grandmother    Breast cancer Paternal Grandmother 39   Bipolar disorder Sister    Bipolar disorder Grandchild    Anxiety disorder Grandchild    Depression Grandchild      Social History   Tobacco Use   Smoking status: Never   Smokeless tobacco: Never  Vaping Use   Vaping Use: Never used  Substance Use Topics   Alcohol use: No    Alcohol/week: 0.0 standard drinks   Drug use: Yes    Comment: prescribed morphine and xanax    Allergies as of 01/13/2021 - Review Complete 01/13/2021  Allergen Reaction Noted   Amoxicillin Hives 08/20/2014   Chlorhexidine gluconate Dermatitis and Hives 07/06/2016   Clindamycin Hives 08/20/2014   Codeine Hives 08/21/2011  Erythromycin Hives 07/06/2016   Penicillin g Hives 07/06/2016   Sulfa antibiotics Nausea And Vomiting and Hives 09/06/2011   Levofloxacin Hives 02/18/2018   Shellfish allergy Hives 10/08/2012   Decadron [dexamethasone] Other (See Comments) 09/20/2011   Mangifera  indica Hives 07/20/2016   Papaya derivatives Hives 10/30/2012   Betadine [povidone iodine] Hives 09/06/2011   Clarithromycin Hives 09/08/2011   Other Hives 09/06/2011   Povidone-iodine Hives 09/06/2011   Prednisone Anxiety 08/18/2014    Review of Systems:    All systems reviewed and negative except where noted in HPI.   Physical Exam:  BP (!) 153/83   Pulse 78   Temp 98.1 F (36.7 C) (Oral)   Resp 15   Ht 5\' 4"  (1.626 m)   Wt 65.3 kg   SpO2 99%   BMI 24.72 kg/m  No LMP recorded. Patient has had a hysterectomy.  General:   Alert,  Well-developed, well-nourished, pleasant and cooperative in NAD Head:  Normocephalic and atraumatic. Eyes:  Sclera clear, no icterus.   Conjunctiva pink. Ears:  Normal auditory acuity. Nose:  No deformity, discharge, or lesions. Mouth:  No deformity or lesions,oropharynx pink & moist. Neck:  Supple; no masses or thyromegaly. Lungs:  Respirations even and unlabored.  Clear throughout to auscultation.   No wheezes, crackles, or rhonchi. No acute distress. Heart:  Regular rate and rhythm; no murmurs, clicks, rubs, or gallops. Abdomen:  Normal bowel sounds. Soft, non-tender and non-distended without masses, hepatosplenomegaly or hernias noted.  No guarding or rebound tenderness.   Rectal: Not performed Msk:  Symmetrical without gross deformities. Good, equal movement & strength bilaterally. Pulses:  Normal pulses noted. Extremities:  No clubbing or edema.  No cyanosis. Neurologic:  Alert and oriented x3;  grossly normal neurologically. Skin:  Intact without significant lesions or rashes. No jaundice. Lymph Nodes:  No significant cervical adenopathy. Psych:  Alert and cooperative. Normal mood and affect.  Imaging Studies: Reviewed  Assessment and Plan:   DERENDA GIDDINGS is a 60 y.o. female with history of A. fib/flutter s/p ablation on Eliquis, OSA, GERD s/p paraesophageal hernia repair and Nissen's fundoplication in 11/94 is admitted with A. fib  with RVR, abdominal discomfort and nausea, CT revealed possible small bowel ileus and enteritis  Small bowel ileus and enteritis resulting in abdominal discomfort and nausea No evidence of leukocytosis, has mild normocytic anemia There are no concerning features for mesenteric ischemia No evidence of visceral artery stenosis, normal venous lactic acid levels General surgery has been consulted, no surgical intervention is recommended  Patient is already having bowel movements Continue CLD If patient has persistent discomfort and nausea, will perform upper endoscopy tomorrow Hold eliquis if ok with cardiology  Pancreatic atrophy Recommend to check pancreatic fecal elastase levels as patient reports intermittent episodes of diarrhea  Normocytic anemia Check iron panel, B12 and folate levels  A. fib with RVR:  Currently rate controlled on Eliquis Cardiology is on board  GI will follow along with you   Cephas Darby, MD

## 2021-01-14 NOTE — Consult Note (Addendum)
[facility] SURGICAL CONSULTATION NOTE (initial) - cpt: [id number]   HISTORY OF PRESENT ILLNESS (HPI):  60 y.o. female known to our service following robotic assisted laparoscopic paraesophageal hernia repair with Nissen fundoplication in December 2021 with Dr Dahlia Byes. Patient presents to the ED yesterday with complaints of chest pain. At time of presentation, patient had been reporting acute onset of palpations over the previous 6 hours with associated chest pain/tightness, SOB, and nausea. She has a history of atrial fibrillation and ablation in the past. Last ablation in May of 2022. She is on Eliquis and compliant with this. While in the ED, she was found to be in Afib with RVR and treated with PO diltiazem with improvement. However, she did develop abdominal discomfort and dry heaves. She was given a GI cocktail without improvement. She would undergo CT for dissection which was concerning for possible SBO vs ileus without transition point. She did have a NGT placed overnight. Output from this is not recorded. She was admitted to the medicine service.   Surgery is consulted by emergency medicine physician Dr. Pryor Curia, DO in this context for evaluation and management of likely ileus in setting of acute medical condition (afib with RVR).  This morning, she reports that she still has some abdominal discomfort and nausea. NGT with minimal to no output. She did have a BM and continues passing flatus this morning.    PAST MEDICAL HISTORY (PMH):  Past Medical History:  Diagnosis Date   Abnormal EKG    HX OF INVERTED T WAVES ON EKG, PALPITATIONS, CHEST PAINS-CARDIAC WORK UP DID NOT SHOW ANY HEART DISEASE   AC (acromioclavicular) joint bone spurs    Acute postoperative pain 01/04/2017   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    Chronic headaches     Chronic, continuous use of opioids    DDD (degenerative disc disease)    CERVICAL AND LUMBAR-CHRONIC PAIN, RT HIP LABRAL TEAR   Depression    PT STATES A LOT OF STRESS IN HER LIFE   Dissociative disorder    Dizziness 04/22/2013   Duodenal ulcer with hemorrhage and perforation ([place]) 04/27/2003   Foot drop, right    FROM BACK SURGERY   GERD (gastroesophageal reflux disease)    H/O arthrodesis 08/18/2014   Headache(784.0)    AND NECK PAIN--STATES RECENT TEST SHOW CERVICAL DEGENERATION   History of blood transfusion    s/p back surgery   History of cardiac cath    a. 05/2019 Cath: Nl cors. EF 55-65%.   History of cardioversion    History of cervical spinal surgery 01/04/2015   History of kidney stones    Hypertension    Inverted T wave    Mitral regurgitation    a. 07/2020 Echo: EF 60-65%, no rwma. Nl RV size/fxn. RVSP 39.37mmHg. Mildly to mod dil LA. Mod MR; b. 07/2020 TEE: EF 55-60%. Lambl's excresence. Nl RV size/fxn. Midly dil LA. No LA/LAA thrombus. Mild MR.   Narrowing of intervertebral disc space 08/18/2014   Currently on disability.    Orthostatic hypotension 04/22/2013   Pain    CHRONIC NECK AND BACK PAIN - LIMITED ROM NECK - S/P FUSIONS CERVICAL AND LUMBAR   Paroxysmal atrial fibrillation ([place]) 2021   a. 07/2020 s/p PVI.   Pneumonia    PONV (postoperative nausea and vomiting)  PT GIVES HX OF N&V AND FEVER WITH SURGERIES YEARS AGO--BUT NO PROBLEMS WITH MORE RECENT SURGERIES--STATES NOT MALIGNANT HYPERTHERMIA   Postop Hyponatremia 05/14/2012   Postoperative anemia due to acute blood loss 05/14/2012   PTSD (post-traumatic stress disorder)    Right foot drop    Right hip arthralgia 08/18/2014   Status post surgery of right, and now needs left.    Sleep apnea 2021   does not have a cpap   Therapeutic opioid-induced constipation ([facility])    Typical atrial flutter (HCC)      PAST SURGICAL HISTORY ([place]):  Past Surgical History:  Procedure Laterality Date   ABDOMINAL  HYSTERECTOMY     afib  05/2019   ANTERIOR CERVICAL DECOMP/DISCECTOMY FUSION N/A 10/30/2012   Procedure: ACDF C5-6, EXPLORATION AND HARDWARE REMOVAL C6-7;  Surgeon: Melina Schools, MD;  Location: [place];  Service: Orthopedics;  Laterality: N/A;   ANTERIOR FUSION CERVICAL SPINE  MAY 2012   AT [place] N/A 08/06/2020   Procedure: ATRIAL FIBRILLATION ABLATION;  Surgeon: Vickie Epley, MD;  Location: [place] [facility];  Service: Cardiovascular;  Laterality: N/A;   BACK SURGERY  2009   LUMBAR FUSION WITH RODS    BREAST BIOPSY Right 2008   benign.- bx/clip   BREAST EXCISIONAL BIOPSY Left 1998   benign   BREAST REDUCTION SURGERY Bilateral 06/2016   CARDIAC ELECTROPHYSIOLOGY MAPPING AND ABLATION     CARDIOVERSION N/A 12/03/2019   Procedure: CARDIOVERSION;  Surgeon: Kate Sable, MD;  Location: [facility];  Service: Cardiovascular;  Laterality: N/A;   CARPAL TUNNEL RELEASE  05-06-12   Right   CHOLECYSTECTOMY     COLONOSCOPY WITH PROPOFOL N/A 01/26/2017   Procedure: COLONOSCOPY WITH PROPOFOL;  Surgeon: Lucilla Lame, MD;  Location: [place];  Service: Endoscopy;  Laterality: N/A;   DIAGNOSTIC LAPAROSCOPIES - MULTIPLE FOR ENDOMETRIOSIS     ESOPHAGEAL DILATION N/A 01/26/2017   Procedure: ESOPHAGEAL DILATION;  Surgeon: Lucilla Lame, MD;  Location: [place];  Service: Endoscopy;  Laterality: N/A;   ESOPHAGEAL MANOMETRY N/A 05/30/2017   Procedure: ESOPHAGEAL MANOMETRY (EM);  Surgeon: Lucilla Lame, MD;  Location: [facility];  Service: Endoscopy;  Laterality: N/A;   ESOPHAGOGASTRODUODENOSCOPY (EGD) WITH PROPOFOL N/A 01/26/2017   Procedure: ESOPHAGOGASTRODUODENOSCOPY (EGD) WITH PROPOFOL;  Surgeon: Lucilla Lame, MD;  Location: [place];  Service: Endoscopy;  Laterality: N/A;   ESOPHAGOGASTRODUODENOSCOPY (EGD) WITH PROPOFOL N/A 01/30/2020   Procedure: ESOPHAGOGASTRODUODENOSCOPY (EGD) WITH PROPOFOL;  Surgeon: Lucilla Lame, MD;  Location:  [place];  Service: Endoscopy;  Laterality: N/A;  sleep apnea COVID + 01-15-20   HIP ARTHROSCOPY  09/20/2011   Procedure: ARTHROSCOPY HIP;  Surgeon: Gearlean Alf, MD;  Location: [facility];  Service: Orthopedics;  Laterality: Right;  Right Hip Scope with Labral Debridement   LEFT HEART CATH AND CORONARY ANGIOGRAPHY Left 06/10/2019   Procedure: LEFT HEART CATH AND CORONARY ANGIOGRAPHY;  Surgeon: Nelva Bush, MD;  Location: [place] [facility];  Service: Cardiovascular;  Laterality: Left;   NASAL SEPTUM SURGERY  MARCH 2013   IN [place]   [facility] IMPEDANCE STUDY N/A 05/30/2017   Procedure: [place] IMPEDANCE STUDY;  Surgeon: Lucilla Lame, MD;  Location: [facility];  Service: Endoscopy;  Laterality: N/A;   POLYPECTOMY N/A 01/26/2017   Procedure: POLYPECTOMY;  Surgeon: Lucilla Lame, MD;  Location: [place];  Service: Endoscopy;  Laterality: N/A;   POSTERIOR CERVICAL FUSION/FORAMINOTOMY N/A 04/16/2013   Procedure: REMOVAL CERVICAL PLATES AND INTERBODY CAGE/POSTERIOR CERVICAL SPINAL  FUSION C4 - C6/C5 CORPECTOMY/C4 - C6 FUSION WITH ILIAC CREST BONE GRAFT;  Surgeon: Melina Schools, MD;  Location: [place];  Service: Orthopedics;  Laterality: N/A;   RADIOFREQUENCY ABLATION NERVES     REDUCTION MAMMAPLASTY Bilateral 05/2016   RIGHT HIP ARTHROSCOPY FOR LABRAL TEAR  ABOUT 2010   2012 also   SHOULDER ARTHROSCOPY  05-06-12   bone spur   TEE WITHOUT CARDIOVERSION N/A 08/05/2020   Procedure: TRANSESOPHAGEAL ECHOCARDIOGRAM (TEE);  Surgeon: Lelon Perla, MD;  [facility];  Service: Cardiovascular;  Laterality: N/A;   TONSILLECTOMY     AS A CHILD   TOTAL HIP ARTHROPLASTY Right 05/13/2012   Procedure: TOTAL HIP ARTHROPLASTY ANTERIOR APPROACH;  Surgeon: Gearlean Alf, MD;  Location: [facility];  Service: Orthopedics;  Laterality: Right;     MEDICATIONS:  Prior to Admission medications   Medication Sig Start Date End Date Taking? Authorizing Provider  acetaminophen (TYLENOL) 500 MG  tablet Take 500-1,000 mg by mouth every 6 (six) hours as needed for moderate pain.    Yes [provider]  ALPRAZolam Duanne Moron) 1 MG tablet Take 1 mg by mouth at bedtime.  07/04/19  Yes [provider]  apixaban (ELIQUIS) 5 MG TABS tablet Take 1 tablet (5 mg total) by mouth 2 (two) times daily. 10/25/20  Yes Agbor-Etang, Aaron Edelman, MD  busPIRone (BUSPAR) 7.5 MG tablet Take 7.5 mg by mouth 2 (two) times daily. 05/06/20  Yes [provider]  celecoxib (CELEBREX) 200 MG capsule Take 1 capsule by mouth 2 (two) times daily. 12/20/20 01/19/21 Yes [provider]  dicyclomine (BENTYL) 20 MG tablet TAKE 1 TABLET (20 MG TOTAL) BY MOUTH 3 (THREE) TIMES DAILY BEFORE MEALS. 11/18/20  Yes Lucilla Lame, MD  diltiazem (CARDIZEM CD) 300 MG 24 hr capsule Take 1 capsule (300 mg total) by mouth daily. 01/13/21  Yes Nena Polio, MD  LINZESS 290 MCG CAPS capsule TAKE 1 CAPSULE BY MOUTH DAILY BEFORE BREAKFAST. 07/19/20  Yes Lucilla Lame, MD  Menthol, Topical Analgesic, (BENGAY EX) Apply 1 application topically daily as needed (pain).   Yes [provider]  montelukast (SINGULAIR) 10 MG tablet TAKE 1 TABLET BY MOUTH EVERY DAY 03/28/20  Yes Bacigalupo, Dionne Bucy, MD  morphine (MSIR) 15 MG tablet Take 1 tablet (15 mg total) by mouth every 6 (six) hours as needed for moderate pain or severe pain. Must last 30 days. 12/17/20 01/16/21 Yes Milinda Pointer, MD  naphazoline-pheniramine (VISINE) 0.025-0.3 % ophthalmic solution 1 drop 4 (four) times daily as needed for eye irritation.   Yes [provider]  pantoprazole (PROTONIX) 40 MG tablet TAKE 1 TABLET BY MOUTH TWICE A DAY 05/18/20  Yes Lucilla Lame, MD  polyethylene glycol (MIRALAX / GLYCOLAX) 17 g packet Take 17 g by mouth daily.   Yes [provider]  sodium chloride (OCEAN) 0.65 % SOLN nasal spray Place 1 spray into both nostrils daily as needed for congestion ([place]ry[place]).   Yes [provider]  morphine (MSIR) 15 MG  tablet Take 1 tablet (15 mg total) by mouth every 6 (six) hours as needed for moderate pain or severe pain. Must last 30 days. 11/17/20 12/17/20  Milinda Pointer, MD  morphine (MSIR) 15 MG tablet Take 1 tablet (15 mg total) by mouth every 6 (six) hours as needed for moderate pain or severe pain. Must last 30 days. 01/16/21 02/15/21  Milinda Pointer, MD  morphine (MSIR) 15 MG tablet Take 1 tablet (15 mg total) by mouth every 6 (  six) hours as needed for moderate pain or severe pain. Must last 30 days. 02/15/21 03/17/21  Milinda Pointer, MD  valACYclovir (VALTREX) 1000 MG tablet TAKE TWO TABLETS BY MOUTH TWICE DAILY FOR ONE DAY FEVER BLISTER 03/02/20   Bacigalupo, Dionne Bucy, MD     ALLERGIES:  Allergies  Allergen Reactions   Amoxicillin Hives    She did ok w ANCEF   Chlorhexidine Gluconate Dermatitis and Hives   Clindamycin Hives   Codeine Hives   Erythromycin Hives    "mycins" in general   Penicillin G Hives    "cillins" in general   Sulfa Antibiotics Nausea And Vomiting and Hives        Levofloxacin Hives   Shellfish Allergy Hives   Decadron [Dexamethasone] Other (See Comments)    Hot flashes, insomnia, "manic" Hot flashes, insomnia, "manic"   Mangifera Indica Hives    papaya   Papaya Derivatives Hives   Betadine [Povidone Iodine] Hives        Clarithromycin Hives   Other Hives     Mango    Povidone-Iodine Hives   Prednisone Anxiety    High blood pressure, flushed, mood changes, heart palpitations High blood pressure, flushed, mood changes, heart palpitations     SOCIAL HISTORY:  Social History   Socioeconomic History   Marital status: Married    Spouse name: bruce   Number of children: 2   Years of education: Not on file   Highest education level: Associate degree: occupational, Hotel manager, or vocational program  Occupational History    Comment: disabled  Tobacco Use   Smoking status: Never   Smokeless tobacco: Never  Vaping Use   Vaping Use: Never used   Substance and Sexual Activity   Alcohol use: No    Alcohol/week: 0.0 standard drinks   Drug use: Yes    Comment: prescribed morphine and xanax   Sexual activity: Yes  Other Topics Concern   Not on file  Social History Narrative   Son, daughter  And 4 grandkids; raises 3 of the grandchildren   Social Determinants of Health   Financial Resource Strain: Not on file  Food Insecurity: No Food Insecurity   Worried About Charity fundraiser in the Last Year: Never true   Arboriculturist in the Last Year: Never true  Transportation Needs: No Transportation Needs   Lack of Transportation (Medical): No   Lack of Transportation (Non-Medical): No  Physical Activity: Not on file  Stress: Not on file  Social Connections: Not on file  Intimate Partner Violence: Not on file     FAMILY HISTORY:  Family History  Problem Relation Age of Onset   Aneurysm Mother    Aneurysm Maternal Grandmother    Breast cancer Paternal Grandmother 60   Bipolar disorder Sister    Bipolar disorder Grandchild    Anxiety disorder Grandchild    Depression Grandchild       REVIEW OF SYSTEMS:  Review of Systems  Constitutional:  Negative for chills and fever.  Respiratory:  Positive for shortness of breath. Negative for cough.   Cardiovascular:  Positive for palpitations. Negative for chest pain.  Gastrointestinal:  Positive for abdominal pain, nausea and vomiting. Negative for constipation and diarrhea.  Genitourinary:  Negative for dysuria.  All other systems reviewed and are negative.  VITAL SIGNS:  Temp:  [98.1 F (36.7 C)] 98.1 F (36.7 C) (11/03 1957) Pulse Rate:  [65-145] 71 (11/04 0700) Resp:  [10-19] 14 (11/04 0700) BP: (109-166)/(73-125)  123/73 (11/04 0700) SpO2:  [95 %-100 %] 95 % (11/04 0700) Weight:  [65.3 kg] 65.3 kg (11/03 1958)     Height: 5\' 4"  (162.6 cm) Weight: 65.3 kg BMI (Calculated): 24.71   INTAKE/OUTPUT:  11/03 0701 - 11/04 0700 In: 252.8 [I.V.:52.8; IV Piggyback:200] Out:  -   PHYSICAL EXAM:  Physical Exam Vitals and nursing note reviewed.  Constitutional:      General: She is not in acute distress.    Appearance: Normal appearance. She is normal weight. She is not ill-appearing.  HENT:     Head: Normocephalic and atraumatic.     Nose:     Comments: NGT in place; no output Eyes:     Conjunctiva/sclera: Conjunctivae normal.     Pupils: Pupils are equal, round, and reactive to light.  Cardiovascular:     Rate and Rhythm: Normal rate and regular rhythm.     Pulses: Normal pulses.     Heart sounds: No murmur heard.    Comments: Now in NSR, regular rate  Pulmonary:     Effort: Pulmonary effort is normal. No respiratory distress.  Abdominal:     General: A surgical scar is present. There is no distension.     Palpations: Abdomen is soft.     Tenderness: There is no abdominal tenderness. There is no guarding or rebound.     Comments: Soft, no appreciable tenderness, non-distended, no rebound/guarding  Genitourinary:    Comments: Deferred Musculoskeletal:     Right lower leg: No edema.     Left lower leg: No edema.  Skin:    General: Skin is warm and dry.     Coloration: Skin is not pale.     Findings: No erythema.  Neurological:     General: No focal deficit present.     Mental Status: She is alert and oriented to person, place, and time.  Psychiatric:        Mood and Affect: Mood normal.        Behavior: Behavior normal.     Labs:  CBC Latest Ref Rng & Units 01/14/2021 01/13/2021 07/22/2020  WBC 4.0 - 10.5 K/uL 7.4 8.3 5.8  Hemoglobin 12.0 - 15.0 g/dL 11.1(L) 12.7 11.0(L)  Hematocrit 36.0 - 46.0 % 33.8(L) 38.6 33.9(L)  Platelets 150 - 400 K/uL 313 353 336   CMP Latest Ref Rng & Units 01/14/2021 01/13/2021 07/22/2020  Glucose 70 - 99 mg/dL 146(H) 144(H) 106(H)  BUN 8 - 23 mg/dL 12 12 9   Creatinine 0.44 - 1.00 mg/dL 0.85 1.07(H) 0.74  Sodium 135 - 145 mmol/L 136 138 137  Potassium 3.5 - 5.1 mmol/L 4.3 2.9(L) 3.7  Chloride 98 - 111 mmol/L 104  102 105  CO2 22 - 32 mmol/L 24 26 23   Calcium 8.9 - 10.3 mg/dL 8.9 9.1 8.9  Total Protein 6.5 - 8.1 g/dL - 7.9 -  Total Bilirubin 0.3 - 1.2 mg/dL - 0.8 -  Alkaline Phos 38 - 126 U/L - 108 -  AST 15 - 41 U/L - 21 -  ALT 0 - 44 U/L - 11 -     Imaging studies:   CTA Chest/Abdomen/Pelvis (01/14/2021) personally reviewed s/p Nissen without recurrence, question enteritis vs ileus, SBO less likely, and radiologist report reviewed below:  IMPRESSION: 1. No aortic aneurysm or dissection and only trace calcifications in the abdominal segment. 2. There is no significant visceral artery stenosis, but the right kidney is noted with asymmetric chronic volume loss and upper pole scarring as before.  3. Mild cardiomegaly with a right chamber predominance and a slight bowing of the interventricular septum to the left. This may be seen with right heart strain and there are prominent superior pulmonary veins as well, but no findings of pulmonary edema or pleural effusions. 4. Prior Nissen procedure with small hiatal hernia and patulous esophagus with fluid retention distally. 5. Nonspecific jejunal enteritis or enteropathy, and in the left lower abdomen mildly dilated small bowel is seen which could be due to an ileus or low-grade obstruction. There is no visible transitional segment. Constipation. 6. Prior cholecystectomy with a prominent common bile duct. Laboratory and clinical correlation advised. 7. Posterior fusion and bone grafting T10-L4 with discectomies and interbody bone plugs at L2-3 and L3-4, appears chronic. 8. Additional findings described above.   Assessment/Plan: (ICD-10's: K47.7) 60 y.o. female with abdominal discomfort and dry heaving, likely secondary to ileus in setting of atrial fibrillation with RVR (now resolved), enteritis is also a possibility, SBO is possible given her previous intra-abdominal surgeries but less likely of the three   - I am not sure the NGT is  providing much benefit right now as there is no output, her stomach is not distended on imaging, and she is having bowel function. I discussed potential clamping trial vs removal this morning. I think it would be reasonable to clamp her NGT and let her try sips of liquids and see how she does. We can always return NGT to LIS if needed. She is in agreement  - No need for emergent surgical intervention  - Monitor abdominal examination; on-going bowel function - Serial KUBs as needed - Pain control prn (avoid/limit narcotics in setting of ileus); antiemetics prn   - encouraged mobilization    - Further management per primary team; we will follow   All of the above findings and recommendations were discussed with the patient, and all of patient's questions were answered to her expressed satisfaction.  Thank you for the opportunity to participate in this patient's care.   -- Edison Simon, PA-C [facility] 01/14/2021, 8:41 AM [phone] M-F: 7am - 4pm

## 2021-01-14 NOTE — ED Notes (Signed)
PT dry heaving endorsing nausea

## 2021-01-14 NOTE — ED Notes (Signed)
Pt assisted to bedside commode

## 2021-01-14 NOTE — ED Provider Notes (Signed)
  Physical Exam  BP 109/85 (BP Location: Left Arm)   Pulse 71   Temp 98.1 F (36.7 C) (Oral)   Resp 14   Ht 5\' 4"  (1.626 m)   Wt 65.3 kg   SpO2 100%   BMI 24.72 kg/m   Physical Exam  ED Course/Procedures     Procedures  MDM  1:30 AM  Assumed care at shift change.  Patient here with chest discomfort with A. fib with RVR.  Patient now rate controlled.  Plan was to monitor for about an hour after oral diltiazem and discharge if still feeling well in normal sinus rhythm.  Patient now having upper abdominal pain that she describes as a burning.  Has had Protonix and a GI cocktail already and states symptoms worsening.  She is now dry heaving as well.  Troponin x2 negative.  LFTs normal.  We will add on lipase.  She has had a previous Niesen fundoplication, hiatal hernia repair and cholecystectomy.  She appears quite uncomfortable on exam.  Will obtain CT dissection study to evaluate for dissection, aneurysm, perforation, pancreatitis, ductal dilatation.  Will give pain and nausea medicine.  Patient also had a potassium of 2.9.  Will give oral and IV replacement.   2:00 AM  Pt's repeat EKG shows diffuse nonspecific T wave flattening but still in sinus rhythm.  Currently off IV diltiazem.  Lipase normal.  CT pending.  Repeat troponin pending.   2:45 AM  Pt's third troponin negative.  Magnesium normal.  Lactic normal.  Official CT read pending.  I reviewed CT imaging and do not see any sign of dissection or aortic aneurysm.    3:30 AM  Pt's CT scan read by radiology.  Patient has mild cardiomegaly with signs of right heart strain but no central pulmonary embolus seen.  She does have some prominence of the superior pulmonary veins but no pulmonary edema or pleural effusions.  She has no known history of CHF.  Last echocardiogram in May 2022 showed EF of 55 to 60%.  No known history of pulmonary hypertension.  CT also shows nonspecific jejunal enteritis, low-grade bowel obstruction versus  ileus without visible transitional segment.  She states that she has had a bowel movement today and has been passing gas.  She continues to have upper abdominal pain and is dry heaving.  Given Zofran without any relief.  Will give Reglan.  Discussed with Dr. Peyton Najjar with general surgery.  He agrees with placing NG tube and admitting patient to medicine service.  Patient comfortable with this plan.    3:32 AM Discussed patient's case with hospitalist, Dr. Sidney Ace.  I have recommended admission and patient (and family if present) agree with this plan. Admitting physician will place admission orders.   I reviewed all nursing notes, vitals, pertinent previous records and reviewed/interpreted all EKGs, lab and urine results, imaging (as available).    Jesiah Grismer, Delice Bison, DO 01/14/21 [phone]

## 2021-01-14 NOTE — Consult Note (Signed)
Cardiology Consultation:   Patient ID: [name]; [id number]; [birth year]   Admit date: 01/13/2021 Date of Consult: 01/14/2021  Primary Care Provider: Virginia Crews, MD Primary Cardiologist: Garen Lah Primary Electrophysiologist:  Quentin Ore   Patient Profile:   [name] is a 60 y.o. female with a hx of normal coronary arteries by LHC in 05/2019, PAF/flutter status post ablation on 08/06/2020, sinus bradycardia, HTN, asthma, GERD, and OSA who is being seen today for the evaluation of A. fib with RVR at the request of Dr. Sidney Ace.  History of Present Illness:   Ms. Falkner underwent diagnostic LHC in 05/2019 which showed no evidence of CAD.  Echo in 06/2019 showed normal LV systolic function.  Outpatient cardiac monitoring demonstrated A. fib with a 3% burden, at which time she was placed on beta-blocker and Eliquis.  She subsequently required cardioversion and amiodarone therapy was initiated in 11/2019.  Follow-up monitoring in 05/2020 showed runs of SVT and A. fib as well as bradycardia with rates in the 30s bpm.  She was evaluated by EP initially who felt like minimizing exposure to amiodarone was appropriate.  However, she was referred to EP for consideration of catheter ablation, and subsequently underwent successful ablation in 07/2020.  Since her last visit with EP, she reports having had approximately 4 episodes of A. fib, each lasting approximately 1 hour in duration and followed by spontaneous resolution.  She presented [facility] on 11/3 with onset of tachypalpitations that began earlier that afternoon.  After this, she developed severe abdominal discomfort with associated nausea.  She was noted to be in A. fib with RVR.  High-sensitivity troponin negative x4.  CTA chest/abdomen/pelvis that showed a possible ileus or low-grade obstruction with constipation.  No evidence of volume overload or PE.  She was placed on a diltiazem drip and converted to sinus rhythm.  With  regards to her ileus, NG tube has been removed due to scant fluid production.  She is being managed conservatively at this time as she has positive flatus and has had a BM.  Currently, she is without chest pain, dyspnea, or palpitations.  She does continue to note significant abdominal pain and nausea.  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    Chronic headaches    Chronic, continuous use of opioids    DDD (degenerative disc disease)    CERVICAL AND LUMBAR-CHRONIC PAIN, RT HIP LABRAL TEAR   Depression    PT STATES A LOT OF STRESS IN HER LIFE   Dissociative disorder    Dizziness 04/22/2013   Duodenal ulcer with hemorrhage and perforation ([place]) 04/27/2003   Foot drop, right    FROM BACK SURGERY   GERD (gastroesophageal reflux disease)    H/O arthrodesis 08/18/2014   Headache(784.0)    AND NECK PAIN--STATES RECENT TEST SHOW CERVICAL DEGENERATION   History of blood transfusion    s/p back surgery   History of cardiac cath    a. 05/2019 Cath: Nl cors. EF 55-65%.   History of cardioversion    History  of cervical spinal surgery 01/04/2015   History of kidney stones    Hypertension    Inverted T wave    Mitral regurgitation    a. 07/2020 Echo: EF 60-65%, no rwma. Nl RV size/fxn. RVSP 39.63mmHg. Mildly to mod dil LA. Mod MR; b. 07/2020 TEE: EF 55-60%. Lambl's excresence. Nl RV size/fxn. Midly dil LA. No LA/LAA thrombus. Mild MR.   Narrowing of intervertebral disc space 08/18/2014   Currently on disability.    Orthostatic hypotension 04/22/2013   Pain    CHRONIC NECK AND BACK PAIN - LIMITED ROM NECK - S/P FUSIONS CERVICAL  AND LUMBAR   Paroxysmal atrial fibrillation ([place]) 2021   a. 07/2020 s/p PVI.   Pneumonia    PONV (postoperative nausea and vomiting)    PT GIVES HX OF N&V AND FEVER WITH SURGERIES YEARS AGO--BUT NO PROBLEMS WITH MORE RECENT SURGERIES--STATES NOT MALIGNANT HYPERTHERMIA   Postop Hyponatremia 05/14/2012   Postoperative anemia due to acute blood loss 05/14/2012   PTSD (post-traumatic stress disorder)    Right foot drop    Right hip arthralgia 08/18/2014   Status post surgery of right, and now needs left.    Sleep apnea 2021   does not have a cpap   Therapeutic opioid-induced constipation (OIC)    Typical atrial flutter [facility])     Past Surgical History:  Procedure Laterality Date   ABDOMINAL HYSTERECTOMY     afib  05/2019   ANTERIOR CERVICAL DECOMP/DISCECTOMY FUSION N/A 10/30/2012   Procedure: ACDF C5-6, EXPLORATION AND HARDWARE REMOVAL C6-7;  Surgeon: Melina Schools, MD;  Location: [place];  Service: Orthopedics;  Laterality: N/A;   ANTERIOR FUSION CERVICAL SPINE  MAY 2012   AT [place] N/A 08/06/2020   Procedure: ATRIAL FIBRILLATION ABLATION;  Surgeon: Vickie Epley, MD;  Location: [place] CV LAB;  Service: Cardiovascular;  Laterality: N/A;   BACK SURGERY  2009   LUMBAR FUSION WITH RODS    BREAST BIOPSY Right 2008   benign.- bx/clip   BREAST EXCISIONAL BIOPSY Left 1998   benign   BREAST REDUCTION SURGERY Bilateral 06/2016   CARDIAC ELECTROPHYSIOLOGY MAPPING AND ABLATION     CARDIOVERSION N/A 12/03/2019   Procedure: CARDIOVERSION;  Surgeon: Kate Sable, MD;  Location: [facility] ORS;  Service: Cardiovascular;  Laterality: N/A;   CARPAL TUNNEL RELEASE  05-06-12   Right   CHOLECYSTECTOMY     COLONOSCOPY WITH PROPOFOL N/A 01/26/2017   Procedure: COLONOSCOPY WITH PROPOFOL;  Surgeon: Lucilla Lame, MD;  Location: [place];  Service: Endoscopy;  Laterality: N/A;   DIAGNOSTIC LAPAROSCOPIES - MULTIPLE FOR ENDOMETRIOSIS     ESOPHAGEAL DILATION N/A  01/26/2017   Procedure: ESOPHAGEAL DILATION;  Surgeon: Lucilla Lame, MD;  Location: [place];  Service: Endoscopy;  Laterality: N/A;   ESOPHAGEAL MANOMETRY N/A 05/30/2017   Procedure: ESOPHAGEAL MANOMETRY (EM);  Surgeon: Lucilla Lame, MD;  Location: [facility] ENDOSCOPY;  Service: Endoscopy;  Laterality: N/A;   ESOPHAGOGASTRODUODENOSCOPY (EGD) WITH PROPOFOL N/A 01/26/2017   Procedure: ESOPHAGOGASTRODUODENOSCOPY (EGD) WITH PROPOFOL;  Surgeon: Lucilla Lame, MD;  Location: [place];  Service: Endoscopy;  Laterality: N/A;   ESOPHAGOGASTRODUODENOSCOPY (EGD) WITH PROPOFOL N/A 01/30/2020   Procedure: ESOPHAGOGASTRODUODENOSCOPY (EGD) WITH PROPOFOL;  Surgeon: Lucilla Lame, MD;  Location: [place];  Service: Endoscopy;  Laterality: N/A;  sleep apnea COVID + 01-15-20   HIP ARTHROSCOPY  09/20/2011   Procedure: ARTHROSCOPY HIP;  Surgeon: Gearlean Alf, MD;  Location: [facility];  Service: Orthopedics;  Laterality:  Right;  Right Hip Scope with Labral Debridement   LEFT HEART CATH AND CORONARY ANGIOGRAPHY Left 06/10/2019   Procedure: LEFT HEART CATH AND CORONARY ANGIOGRAPHY;  Surgeon: Nelva Bush, MD;  Location: [place] CV LAB;  Service: Cardiovascular;  Laterality: Left;   NASAL SEPTUM SURGERY  MARCH 2013   IN [place]   [facility] IMPEDANCE STUDY N/A 05/30/2017   Procedure: [place] IMPEDANCE STUDY;  Surgeon: Lucilla Lame, MD;  Location: [facility] ENDOSCOPY;  Service: Endoscopy;  Laterality: N/A;   POLYPECTOMY N/A 01/26/2017   Procedure: POLYPECTOMY;  Surgeon: Lucilla Lame, MD;  Location: [place];  Service: Endoscopy;  Laterality: N/A;   POSTERIOR CERVICAL FUSION/FORAMINOTOMY N/A 04/16/2013   Procedure: REMOVAL CERVICAL PLATES AND INTERBODY CAGE/POSTERIOR CERVICAL SPINAL FUSION C4 - C6/C5 CORPECTOMY/C4 - C6 FUSION WITH ILIAC CREST BONE GRAFT;  Surgeon: Melina Schools, MD;  Location: [place];  Service: Orthopedics;  Laterality: N/A;   RADIOFREQUENCY ABLATION NERVES     REDUCTION  MAMMAPLASTY Bilateral 05/2016   RIGHT HIP ARTHROSCOPY FOR LABRAL TEAR  ABOUT 2010   2012 also   SHOULDER ARTHROSCOPY  05-06-12   bone spur   TEE WITHOUT CARDIOVERSION N/A 08/05/2020   Procedure: TRANSESOPHAGEAL ECHOCARDIOGRAM (TEE);  Surgeon: Lelon Perla, MD;  Location: [facility];  Service: Cardiovascular;  Laterality: N/A;   TONSILLECTOMY     AS A CHILD   TOTAL HIP ARTHROPLASTY Right 05/13/2012   Procedure: TOTAL HIP ARTHROPLASTY ANTERIOR APPROACH;  Surgeon: Gearlean Alf, MD;  Location: [facility];  Service: Orthopedics;  Laterality: Right;     Home Meds: Prior to Admission medications   Medication Sig Start Date End Date Taking? Authorizing Provider  acetaminophen (TYLENOL) 500 MG tablet Take 500-1,000 mg by mouth every 6 (six) hours as needed for moderate pain.    Yes [provider]  ALPRAZolam Duanne Moron) 1 MG tablet Take 1 mg by mouth at bedtime.  07/04/19  Yes [provider]  apixaban (ELIQUIS) 5 MG TABS tablet Take 1 tablet (5 mg total) by mouth 2 (two) times daily. 10/25/20  Yes Agbor-Etang, Aaron Edelman, MD  busPIRone (BUSPAR) 7.5 MG tablet Take 7.5 mg by mouth 2 (two) times daily. 05/06/20  Yes [provider]  celecoxib (CELEBREX) 200 MG capsule Take 1 capsule by mouth 2 (two) times daily. 12/20/20 01/19/21 Yes [provider]  dicyclomine (BENTYL) 20 MG tablet TAKE 1 TABLET (20 MG TOTAL) BY MOUTH 3 (THREE) TIMES DAILY BEFORE MEALS. 11/18/20  Yes Lucilla Lame, MD  diltiazem (CARDIZEM CD) 300 MG 24 hr capsule Take 1 capsule (300 mg total) by mouth daily. 01/13/21  Yes Nena Polio, MD  LINZESS 290 MCG CAPS capsule TAKE 1 CAPSULE BY MOUTH DAILY BEFORE BREAKFAST. 07/19/20  Yes Lucilla Lame, MD  Menthol, Topical Analgesic, (BENGAY EX) Apply 1 application topically daily as needed (pain).   Yes [provider]  montelukast (SINGULAIR) 10 MG tablet TAKE 1 TABLET BY MOUTH EVERY DAY 03/28/20  Yes Bacigalupo, Dionne Bucy, MD  morphine (MSIR) 15 MG tablet Take  1 tablet (15 mg total) by mouth every 6 (six) hours as needed for moderate pain or severe pain. Must last 30 days. 12/17/20 01/16/21 Yes Milinda Pointer, MD  naphazoline-pheniramine (VISINE) 0.025-0.3 % ophthalmic solution 1 drop 4 (four) times daily as needed for eye irritation.   Yes [provider]  pantoprazole (PROTONIX) 40 MG tablet TAKE 1 TABLET BY MOUTH TWICE A DAY 05/18/20  Yes Lucilla Lame, MD  polyethylene glycol (MIRALAX / GLYCOLAX) 17 g packet  Take 17 g by mouth daily.   Yes [provider]  sodium chloride (OCEAN) 0.65 % SOLN nasal spray Place 1 spray into both nostrils daily as needed for congestion ([place]).   Yes [provider]  morphine (MSIR) 15 MG tablet Take 1 tablet (15 mg total) by mouth every 6 (six) hours as needed for moderate pain or severe pain. Must last 30 days. 11/17/20 12/17/20  Milinda Pointer, MD  morphine (MSIR) 15 MG tablet Take 1 tablet (15 mg total) by mouth every 6 (six) hours as needed for moderate pain or severe pain. Must last 30 days. 01/16/21 02/15/21  Milinda Pointer, MD  morphine (MSIR) 15 MG tablet Take 1 tablet (15 mg total) by mouth every 6 (six) hours as needed for moderate pain or severe pain. Must last 30 days. 02/15/21 03/17/21  Milinda Pointer, MD  valACYclovir (VALTREX) 1000 MG tablet TAKE TWO TABLETS BY MOUTH TWICE DAILY FOR ONE DAY FEVER BLISTER 03/02/20   Bacigalupo, Dionne Bucy, MD    Inpatient Medications: Scheduled Meds:  ALPRAZolam  1 mg Oral QHS   apixaban  5 mg Oral BID   busPIRone  7.5 mg Oral BID   celecoxib  200 mg Oral BID   dicyclomine  20 mg Oral TID AC   diltiazem  120 mg Oral Daily   linaclotide  290 mcg Oral QAC breakfast   montelukast  10 mg Oral Daily   pantoprazole (PROTONIX) IV  40 mg Intravenous Q12H   polyethylene glycol  17 g Oral Daily   Continuous Infusions:  0.9 % NaCl with KCl 40 mEq / L 100 mL/hr at 01/14/21 0604   PRN Meds: morphine, sodium chloride  Allergies:   Allergies   Allergen Reactions   Amoxicillin Hives    She did ok w ANCEF   Chlorhexidine Gluconate Dermatitis and Hives   Clindamycin Hives   Codeine Hives   Erythromycin Hives    "mycins" in general   Penicillin G Hives    "cillins" in general   Sulfa Antibiotics Nausea And Vomiting and Hives        Levofloxacin Hives   Shellfish Allergy Hives   Decadron [Dexamethasone] Other (See Comments)    Hot flashes, insomnia, "manic" Hot flashes, insomnia, "manic"   Mangifera Indica Hives    papaya   Papaya Derivatives Hives   Betadine [Povidone Iodine] Hives        Clarithromycin Hives   Other Hives     Mango    Povidone-Iodine Hives   Prednisone Anxiety    High blood pressure, flushed, mood changes, heart palpitations High blood pressure, flushed, mood changes, heart palpitations    Social History:   Social History   Socioeconomic History   Marital status: Married    Spouse name: bruce   Number of children: 2   Years of education: Not on file   Highest education level: Associate degree: occupational, Hotel manager, or vocational program  Occupational History    Comment: disabled  Tobacco Use   Smoking status: Never   Smokeless tobacco: Never  Vaping Use   Vaping Use: Never used  Substance and Sexual Activity   Alcohol use: No    Alcohol/week: 0.0 standard drinks   Drug use: Yes    Comment: prescribed morphine and xanax   Sexual activity: Yes  Other Topics Concern   Not on file  Social History Narrative   Son, daughter  And 4 grandkids; raises 3 of the grandchildren   Social Determinants of Health  Financial Resource Strain: Not on file  Food Insecurity: No Food Insecurity   Worried About Charity fundraiser in the Last Year: Never true   Arboriculturist in the Last Year: Never true  Transportation Needs: No Transportation Needs   Lack of Transportation (Medical): No   Lack of Transportation (Non-Medical): No  Physical Activity: Not on file  Stress: Not on file   Social Connections: Not on file  Intimate Partner Violence: Not on file     Family History:   Family History  Problem Relation Age of Onset   Aneurysm Mother    Aneurysm Maternal Grandmother    Breast cancer Paternal Grandmother 8   Bipolar disorder Sister    Bipolar disorder Grandchild    Anxiety disorder Grandchild    Depression Grandchild     ROS:  Review of Systems  Constitutional:  Positive for malaise/fatigue. Negative for chills, diaphoresis, fever and weight loss.  HENT:  Negative for congestion.   Eyes:  Negative for discharge and redness.  Respiratory:  Negative for cough, sputum production, shortness of breath and wheezing.   Cardiovascular:  Positive for palpitations. Negative for chest pain, orthopnea, claudication, leg swelling and PND.  Gastrointestinal:  Positive for abdominal pain and nausea. Negative for blood in stool, constipation, diarrhea, heartburn, melena and vomiting.  Musculoskeletal:  Negative for falls and myalgias.  Skin:  Negative for rash.  Neurological:  Positive for weakness. Negative for dizziness, tingling, tremors, sensory change, speech change, focal weakness and loss of consciousness.  Endo/Heme/Allergies:  Does not bruise/bleed easily.  Psychiatric/Behavioral:  Negative for substance abuse. The patient is not nervous/anxious.   All other systems reviewed and are negative.    Physical Exam/Data:   Vitals:   01/14/21 1145 01/14/21 1200 01/14/21 1215 01/14/21 1230  BP:  (!) 149/81  (!) 153/83  Pulse: 70 76 (!) 104 78  Resp: 14 (!) 8 16 15   Temp:      TempSrc:      SpO2: 97% 96% 100% 99%  Weight:      Height:        Intake/Output Summary (Last 24 hours) at 01/14/2021 1242 Last data filed at 01/14/2021 0405 Gross per 24 hour  Intake 252.83 ml  Output --  Net 252.83 ml   Filed Weights   01/13/21 1958  Weight: 65.3 kg   Body mass index is 24.72 kg/m.   Physical Exam: General: Well developed, well nourished, in mild distress  with abdominal discomfort and nausea. Head: Normocephalic, atraumatic, sclera non-icteric, no xanthomas, nares without discharge.  Neck: Negative for carotid bruits. JVD not elevated. Lungs: Clear bilaterally to auscultation without wheezes, rales, or rhonchi. Breathing is unlabored. Heart: RRR with S1 S2. No murmurs, rubs, or gallops appreciated. Abdomen: Soft, diffusely tender, non-distended with normoactive bowel sounds. No hepatomegaly. No rebound/guarding. No obvious abdominal masses. Msk:  Strength and tone appear normal for age. Extremities: No clubbing or cyanosis. No edema. Distal pedal pulses are 2+ and equal bilaterally. Neuro: Alert and oriented X 3. No facial asymmetry. No focal deficit. Moves all extremities spontaneously. Psych:  Responds to questions appropriately with a normal affect.   EKG:  The EKG was personally reviewed and demonstrates: Initial EKG demonstrates A. fib with RVR, 143 bpm, baseline artifact, nonspecific ST-T changes.  Most recent EKG from earlier today demonstrates NSR, 77 bpm, rare PAC, nonspecific ST-T changes.  Please review Epic for further EKGs Telemetry:  Telemetry was personally reviewed and demonstrates: SR  Weights: [employer]  Weights   01/13/21 1958  Weight: 65.3 kg    Relevant CV Studies:  TEE 07/2020: 1. 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   2. Left ventricular ejection fraction, by estimation, is 55 to 60%. The  left ventricle has normal function.   3. Right ventricular systolic function is normal. The right ventricular  size is normal.   4. Left atrial size was mildly dilated. No left atrial/left atrial  appendage thrombus was detected.   5. The mitral valve is normal in structure. Mild mitral valve  regurgitation.   6. The aortic valve is tricuspid. Aortic valve regurgitation is trivial.   7. There is mild (Grade II) plaque involving the descending aorta. __________  2D echo 07/2020: 1. Left  ventricular ejection fraction, by estimation, is 60 to 65%. The  left ventricle has normal function. The left ventricle has no regional  wall motion abnormalities. Left ventricular diastolic parameters are  indeterminate.   2. Right ventricular systolic function is normal. The right ventricular  size is normal. There is mildly elevated pulmonary artery systolic  pressure. The estimated right ventricular systolic pressure is 79.3 mmHg.   3. Left atrial size was mild to moderately dilated.   4. Unable to exclude mild mitral valve prolapse of the posterior leaflet.  Moderate mitral valve regurgitation. No evidence of mitral stenosis.  __________  Elwyn Reach patch 04/2020: Patient had a min HR of 35 bpm, max HR of 152 bpm, and avg HR of 59 bpm. Predominant underlying rhythm was Sinus Rhythm.  Frequent paroxysmal SVTs noted, episodes of atrial fibrillation noted.  Profound sinus bradycardia heart rates in the 30s noted. __________  2D echo 06/2019: 1. Left ventricular ejection fraction, by estimation, is 55 to 60%. The  left ventricle has normal function. The left ventricle has no regional  wall motion abnormalities. Left ventricular diastolic parameters are  indeterminate.   2. Right ventricular systolic function is normal. The right ventricular  size is normal. There is normal pulmonary artery systolic pressure.   3. Left atrial size was mildly dilated.   4. The mitral valve is normal in structure. Mild mitral valve  regurgitation.   5. The aortic valve is tricuspid. Aortic valve regurgitation is not  visualized.   6. The inferior vena cava is normal in size with greater than 50%  respiratory variability, suggesting right atrial pressure of 3 mmHg.  __________  LHC 05/2019: Conclusions: No angiographically significant coronary artery disease. Normal left ventricular contraction with mildly elevated filling pressure, suggestive of diastolic heart failure.   Recommendations: Primary prevention  of coronary artery disease. Initiate furosemide 40 mg p.o. every other day, as ordered by Dr. Caryl Comes earlier today. If no evidence of bleeding or vascular injury at right radial catheterization site, apixaban can be restarted tomorrow. Follow-up with Dr. Garen Lah or APP in ~2 weeks. __________  Elwyn Reach patch 04/2019: Predominant underlying rhythm was Sinus Rhythm. Atrial Fibrillation occurred (3% burden), ranging from 68-155 bpm (avg of 112 bpm), the longest lasting 5 hours 39 mins with an avg rate of 109 bpm. Supraventricular Tachycardia and Atrial Fibrillation were detected within +/- 45 seconds of symptomatic patient event(s). Isolated SVEs were occasional (1.7%, 16747), SVE Couplets were rare (<1.0%, 531).   Laboratory Data:  Chemistry Recent Labs  Lab 01/13/21 2014 01/14/21 0615  NA 138 136  K 2.9* 4.3  CL 102 104  CO2 26 24  GLUCOSE 144* 146*  BUN 12 12  CREATININE 1.07* 0.85  CALCIUM 9.1 8.9  GFRNONAA 59* >60  ANIONGAP 10 8    Recent Labs  Lab 01/13/21 2014  PROT 7.9  ALBUMIN 4.3  AST 21  ALT 11  ALKPHOS 108  BILITOT 0.8   Hematology Recent Labs  Lab 01/13/21 2014 01/14/21 0615  WBC 8.3 7.4  RBC 4.68 4.14  HGB 12.7 11.1*  HCT 38.6 33.8*  MCV 82.5 81.6  MCH 27.1 26.8  MCHC 32.9 32.8  RDW 13.7 13.8  PLT 353 313   Cardiac EnzymesNo results for input(s): TROPONINI in the last 168 hours. No results for input(s): TROPIPOC in the last 168 hours.  BNPNo results for input(s): BNP, PROBNP in the last 168 hours.  DDimer  Recent Labs  Lab 01/13/21 2013  DDIMER 0.34    Radiology/Studies:  DG Abdomen 1 View  Result Date: 01/14/2021 IMPRESSION: Enteric feeding tube placed into the stomach, side hole at the level of the fundus. Electronically Signed   By: Genevie Ann M.D.   On: 01/14/2021 05:31   DG Chest Portable 1 View  Result Date: 01/13/2021 IMPRESSION: Stable exam without acute cardiopulmonary disease. Electronically Signed   By: Virgina Norfolk M.D.    On: 01/13/2021 20:33   DG Abd 2 Views  Result Date: 01/14/2021 IMPRESSION: 1. Slight interval retraction of the indwelling gastric decompression tube with the proximal side hole now proximal to the gastroesophageal junction. 2. Similar appearing nonspecific bowel gas pattern. Electronically Signed   By: Ruthann Cancer M.D.   On: 01/14/2021 10:16   CT Angio Chest/Abd/Pel for Dissection W and/or Wo Contrast  Result Date: 01/14/2021 IMPRESSION: 1. No aortic aneurysm or dissection and only trace calcifications in the abdominal segment. 2. There is no significant visceral artery stenosis, but the right kidney is noted with asymmetric chronic volume loss and upper pole scarring as before. 3. Mild cardiomegaly with a right chamber predominance and a slight bowing of the interventricular septum to the left. This may be seen with right heart strain and there are prominent superior pulmonary veins as well, but no findings of pulmonary edema or pleural effusions. 4. Prior Nissen procedure with small hiatal hernia and patulous esophagus with fluid retention distally. 5. Nonspecific jejunal enteritis or enteropathy, and in the left lower abdomen mildly dilated small bowel is seen which could be due to an ileus or low-grade obstruction. There is no visible transitional segment. Constipation. 6. Prior cholecystectomy with a prominent common bile duct. Laboratory and clinical correlation advised. 7. Posterior fusion and bone grafting T10-L4 with discectomies and interbody bone plugs at L2-3 and L3-4, appears chronic. 8. Additional findings described above. Electronically Signed   By: Telford Nab M.D.   On: 01/14/2021 03:06    Assessment and Plan:   1. Persistent Afib with RVR: -Converted to sinus rhythm with diltiazem gtt -Likely in the setting of severe abdominal pain and ileus  -Positive flatus and BM, therefore will continue Eliquis at this time, rather than placing her on a heparin gtt  -If arrhythmia recurs,  could place patient back on diltiazem drip -She has been placed on Cardizem CD in the ED -She does have a history of sinus bradycardia which has precluded ongoing use of standing AV nodal blocking medications, her heart rate will need to be monitored on this medication while admitted -For outpatient use, could consider as needed metoprolol or diltiazem -She reports approximately 4 episodes of brief A. fib since her last EP visit with each episode lasting approximately 1 hour followed by spontaneous conversion -She can  follow-up with EP for further discussion of antiarrhythmic therapy  2.  HTN: -Blood pressure is mildly elevated in the ED, likely in the setting of pain -Continue current therapy   Remaining noncardiac issues and abnormalities per primary service   For questions or updates, please contact [facility] Please consult [email or web address] for contact info under Cardiology/STEMI.   Signed, Christell Faith, PA-C [place] Pager: [phone] 01/14/2021, 12:42 PM

## 2021-01-14 NOTE — ED Notes (Signed)
Pt complaining of significant nausea. Floor provider contacted and placed order for compazine. Will given 2200 meds when nausea has subsided.

## 2021-01-15 ENCOUNTER — Inpatient Hospital Stay: Payer: Medicare Other | Admitting: Anesthesiology

## 2021-01-15 ENCOUNTER — Inpatient Hospital Stay: Payer: Medicare Other

## 2021-01-15 ENCOUNTER — Encounter: Payer: Self-pay | Admitting: Internal Medicine

## 2021-01-15 ENCOUNTER — Encounter
Admission: EM | Disposition: A | Discharge status: Home / Self Care | Payer: Self-pay | Source: Home / Self Care | Attending: Internal Medicine

## 2021-01-15 DIAGNOSIS — I4891 Unspecified atrial fibrillation: Secondary | ICD-10-CM | POA: Diagnosis not present

## 2021-01-15 DIAGNOSIS — K567 Ileus, unspecified: Secondary | ICD-10-CM

## 2021-01-15 DIAGNOSIS — R112 Nausea with vomiting, unspecified: Secondary | ICD-10-CM | POA: Diagnosis not present

## 2021-01-15 DIAGNOSIS — K529 Noninfective gastroenteritis and colitis, unspecified: Secondary | ICD-10-CM

## 2021-01-15 DIAGNOSIS — R1013 Epigastric pain: Secondary | ICD-10-CM | POA: Diagnosis not present

## 2021-01-15 HISTORY — PX: ESOPHAGOGASTRODUODENOSCOPY (EGD) WITH PROPOFOL: SHX5813

## 2021-01-15 SURGERY — ESOPHAGOGASTRODUODENOSCOPY (EGD) WITH PROPOFOL
Anesthesia: Monitor Anesthesia Care

## 2021-01-15 MED ORDER — METOPROLOL TARTRATE 5 MG/5ML IV SOLN
5.0000 mg | Freq: Four times a day (QID) | INTRAVENOUS | Status: DC | PRN
  Administered 2021-01-15: 5 mg via INTRAVENOUS
  Filled 2021-01-15: qty 5

## 2021-01-15 MED ORDER — DILTIAZEM HCL-DEXTROSE 125-5 MG/125ML-% IV SOLN (PREMIX)
5.0000 mg/h | INTRAVENOUS | Status: DC
  Filled 2021-01-15: qty 125

## 2021-01-15 MED ORDER — PROPOFOL 500 MG/50ML IV EMUL
INTRAVENOUS | Status: DC | PRN
  Administered 2021-01-15: 150 ug/kg/min via INTRAVENOUS

## 2021-01-15 MED ORDER — SOD CITRATE-CITRIC ACID 500-334 MG/5ML PO SOLN
30.0000 mL | Freq: Once | ORAL | Status: AC
Start: 2021-01-15 — End: 2021-01-15
  Administered 2021-01-15: 30 mL via ORAL
  Filled 2021-01-15: qty 30

## 2021-01-15 MED ORDER — SODIUM CHLORIDE 0.9 % IV SOLN: INTRAVENOUS | Status: DC

## 2021-01-15 MED ORDER — SODIUM CHLORIDE 0.9 % IV SOLN
300.0000 mg | Freq: Once | INTRAVENOUS | Status: DC
  Filled 2021-01-15: qty 15

## 2021-01-15 MED ORDER — SODIUM CHLORIDE 0.9 % IV SOLN
INTRAVENOUS | Status: DC
  Administered 2021-01-16: 20 mL/h via INTRAVENOUS

## 2021-01-15 MED ORDER — PROPOFOL 10 MG/ML IV BOLUS
INTRAVENOUS | Status: AC
  Filled 2021-01-15: qty 20

## 2021-01-15 MED ORDER — PROPOFOL 10 MG/ML IV BOLUS
INTRAVENOUS | Status: DC | PRN
  Administered 2021-01-15: 80 mg via INTRAVENOUS

## 2021-01-15 MED ORDER — ACETAMINOPHEN 500 MG PO TABS
1000.0000 mg | ORAL_TABLET | Freq: Four times a day (QID) | ORAL | Status: DC | PRN
  Administered 2021-01-15 – 2021-01-17 (×3): 1000 mg via ORAL
  Filled 2021-01-15 (×3): qty 2

## 2021-01-15 MED ORDER — SODIUM CHLORIDE 0.9 % IV SOLN
INTRAVENOUS | Status: DC
  Administered 2021-01-15: 20 mL/h via INTRAVENOUS

## 2021-01-15 MED ORDER — INFLUENZA VAC SPLIT QUAD 0.5 ML IM SUSY
0.5000 mL | PREFILLED_SYRINGE | INTRAMUSCULAR | Status: AC
  Administered 2021-01-16: 0.5 mL via INTRAMUSCULAR
  Filled 2021-01-15: qty 0.5

## 2021-01-15 MED ORDER — LIDOCAINE HCL (PF) 2 % IJ SOLN
INTRAMUSCULAR | Status: AC
  Filled 2021-01-15: qty 5

## 2021-01-15 MED ORDER — DILTIAZEM HCL 25 MG/5ML IV SOLN
10.0000 mg | Freq: Once | INTRAVENOUS | Status: AC
  Administered 2021-01-15: 10 mg via INTRAVENOUS
  Filled 2021-01-15: qty 5

## 2021-01-15 MED ORDER — POLYETHYLENE GLYCOL 3350 17 GM/SCOOP PO POWD
1.0000 | Freq: Once | ORAL | Status: DC
  Filled 2021-01-15: qty 255

## 2021-01-15 MED ORDER — POLYETHYLENE GLYCOL 3350 17 GM/SCOOP PO POWD
1.0000 | Freq: Once | ORAL | Status: AC
  Administered 2021-01-15: 255 g via ORAL
  Filled 2021-01-15: qty 255

## 2021-01-15 MED ORDER — LIDOCAINE HCL (CARDIAC) PF 100 MG/5ML IV SOSY
PREFILLED_SYRINGE | INTRAVENOUS | Status: DC | PRN
  Administered 2021-01-15: 40 mg via INTRAVENOUS

## 2021-01-15 MED ORDER — GLYCOPYRROLATE 0.2 MG/ML IJ SOLN
INTRAMUSCULAR | Status: AC
  Filled 2021-01-15: qty 1

## 2021-01-15 NOTE — Progress Notes (Signed)
CC: ileus Subjective: Ileus versus enteritis Significant improvement from abdominal perspective. Had multiple bowel movements.  Abdominal x-ray personally reviewed and there is no evidence of any obstruction no free air. Ending EGD this morning. Feels much better and denies any pain  Objective: Vital signs in last 24 hours: Temp:  [98.2 F (36.8 C)-101.2 F (38.4 C)] 98.2 F (36.8 C) (11/05 1224) Pulse Rate:  [76-95] 76 (11/05 1224) Resp:  [15-19] 16 (11/05 1224) BP: (112-139)/(64-82) 139/82 (11/05 1224) SpO2:  [93 %-100 %] 98 % (11/05 1224) Weight:  [65.3 kg] 65.3 kg (11/05 1224)    Intake/Output from previous day: No intake/output data recorded. Intake/Output this shift: No intake/output data recorded.  Physical exam:  NAd, alert Abd: soft, nt non distended, no peritonitis Ext: well perfused, no edema Neuro: GCS 15, no motor or sens deficits  Lab Results: CBC  Recent Labs    01/13/21 2014 01/14/21 0615  WBC 8.3 7.4  HGB 12.7 11.1*  HCT 38.6 33.8*  PLT 353 313   BMET Recent Labs    01/13/21 2014 01/14/21 0615  NA 138 136  K 2.9* 4.3  CL 102 104  CO2 26 24  GLUCOSE 144* 146*  BUN 12 12  CREATININE 1.07* 0.85  CALCIUM 9.1 8.9   PT/INR No results for input(s): LABPROT, INR in the last 72 hours. ABG No results for input(s): PHART, HCO3 in the last 72 hours.  Invalid input(s): PCO2, PO2  Studies/Results: DG Abdomen 1 View  Result Date: 01/14/2021 CLINICAL DATA:  60 year old female NG tube placement. EXAM: ABDOMEN - 1 VIEW COMPARISON:  CT Abdomen and Pelvis 0221 hours today. FINDINGS: AP upright view at 0447 hours. Enteric feeding tube placed into the stomach, tip at the level of the mid gastric body. Side hole is at the level of the fundus. Prior fundoplication seen by CT today. Stable cholecystectomy clips. Extensive thoracolumbar spinal fusion hardware again noted. Stable bowel gas pattern. No pneumoperitoneum. Negative lung bases. IMPRESSION: Enteric  feeding tube placed into the stomach, side hole at the level of the fundus. Electronically Signed   By: Genevie Ann M.D.   On: 01/14/2021 05:31   DG Chest Portable 1 View  Result Date: 01/13/2021 CLINICAL DATA:  Palpitations and chest pain. EXAM: PORTABLE CHEST 1 VIEW COMPARISON:  February 27, 2020 FINDINGS: The heart size and mediastinal contours are within normal limits. Both lungs are clear. Stable postoperative changes are seen within the mid to lower cervical spine, lower thoracic spine and upper lumbar spine. IMPRESSION: Stable exam without acute cardiopulmonary disease. Electronically Signed   By: Virgina Norfolk M.D.   On: 01/13/2021 20:33   DG Abd 2 Views  Result Date: 01/14/2021 CLINICAL DATA:  60 year old female with ileus. EXAM: ABDOMEN - 2 VIEW COMPARISON:  Earlier the same day FINDINGS: Slight interval retraction of indwelling gastric decompression tube, now with the proximal side hole just proximal to the gastroesophageal junction in the tip in the gastric fundus. Similar appearing nonspecific bowel gas pattern. Cholecystectomy clips in right upper quadrant. Similar appearing thoracolumbar fusion hardware and partially visualized right total hip arthroplasty. IMPRESSION: 1. Slight interval retraction of the indwelling gastric decompression tube with the proximal side hole now proximal to the gastroesophageal junction. 2. Similar appearing nonspecific bowel gas pattern. Electronically Signed   By: Ruthann Cancer M.D.   On: 01/14/2021 10:16   DG ABD ACUTE 2+V W 1V CHEST  Result Date: 01/15/2021 CLINICAL DATA:  Tachycardia. Shortness of breath. Nausea. Previous ablation for atrial  fibrillation. EXAM: DG ABDOMEN ACUTE WITH 1 VIEW CHEST COMPARISON:  01/14/2021 and 01/13/2021 FINDINGS: There is no evidence of dilated bowel loops or free intraperitoneal air. No radiopaque calculi or other significant radiographic abnormality is seen. Surgical clips again seen from prior cholecystectomy.  Thoracolumbar spine fusion hardware and right hip prosthesis again noted. Heart size and mediastinal contours are within normal limits. Both lungs are clear. Cervical spine fusion hardware is also noted. IMPRESSION: Unremarkable bowel gas pattern.  No acute findings. No active cardiopulmonary disease. Electronically Signed   By: Marlaine Hind M.D.   On: 01/15/2021 08:53   CT Angio Chest/Abd/Pel for Dissection W and/or Wo Contrast  Result Date: 01/14/2021 CLINICAL DATA:  Prior Nissen fundoplication with patulous esophagus with fluid retention in the distal esophagus. EXAM: CT ANGIOGRAPHY CHEST, ABDOMEN AND PELVIS TECHNIQUE: Non-contrast CT of the chest was initially obtained. Multidetector CT imaging through the chest, abdomen and pelvis was performed using the standard protocol during bolus administration of intravenous contrast. Multiplanar reconstructed images and MIPs were obtained and reviewed to evaluate the vascular anatomy. CONTRAST:  31mL OMNIPAQUE IOHEXOL 350 MG/ML SOLN COMPARISON:  Noncontrast abdomen and pelvis CTs 05/01/2020 and 08/28/2016. No prior chest CT for comparison FINDINGS: CTA CHEST FINDINGS Cardiovascular: There is mild panchamber cardiomegaly but with a slight right chamber predominance and bowing of the interventricular septum towards the left which may be seen with right heart strain. There is distension of the superior pulmonary veins as well. There is no pericardial effusion, no pulmonary arterial dilatation or central embolus. There are no appreciable coronary artery calcifications. No aortic aneurysm or dissection is seen and no significant visible plaques with mild descending tortuosity. The great vessels are normal. Mediastinum/Nodes: No enlarged mediastinal, hilar, or axillary lymph nodes. Thyroid gland, trachea, demonstrate no significant findings. There is a mildly patulous esophagus with scattered fluid in the distal esophagus and a small hiatus hernia. Lungs/Pleura: The lungs  are clear of infiltrates. There are scattered linear scar-like opacities in both bases. There is no pleural effusion, thickening or pneumothorax. Central airways in the trachea are clear. Musculoskeletal: There is fusion plating partially visible in the lower cervical spine. Multilevel Harrington rod fusion begins at T10. There is no worrisome regional skeletal lesion. Review of the MIP images confirms the above findings. CTA ABDOMEN AND PELVIS FINDINGS VASCULAR Aorta: Normal caliber aorta without aneurysm, dissection, vasculitis or significant stenosis. There are scattered trace wall calcifications. Celiac: Patent without evidence of aneurysm, dissection, vasculitis or significant stenosis. SMA: Patent without evidence of aneurysm, dissection, vasculitis or significant stenosis. Renals: There are bilateral single renal arteries and neither demonstrate significant plaques or stenosis. IMA: Normal. Inflow: Common iliac, both internal and external iliac arteries are clear. Veins: The SVC is well opacified but the IVC and iliac veins are unopacified at the time of imaging. The portal vein is fairly well opacified and normal caliber. Review of the MIP images confirms the above findings. NON-VASCULAR: Abdominal viscera not optimally visualized due to extensive metal artifact from multilevel posterior rod fusion, T10-L4. Hepatobiliary: No obvious mass enhancement in the liver. Gallbladder is absent and the common bile duct is dilated measuring 12 mm without a visible obvious filling defect Pancreas: Partially atrophic, otherwise grossly unremarkable. Spleen: No enlargement or mass enhancement. Adrenals/Urinary Tract: There is no adrenal mass. Asymmetric right renal cortical volume loss and upper pole cortical scarring are again shown. There is no renal mass, calculus or hydronephrosis Stomach/Bowel: Small hiatal hernia and evidence of Nissen fundoplication. Mucosal thickening in  the jejunal segments in the left abdomen is  noted. There is mild dilatation of the left lower quadrant small bowel to up to 2.7 cm and relatively decompressed pelvic and right abdominal small bowel but no visible transitional segment. The appendix is normal caliber. There is moderate to severe stool retention in the ascending colon. Left colonic diverticula without evidence of diverticulitis Lymphatic: No adenopathy is seen through the metallic artifact, additional metal artifact of the pelvis from right hip arthroplasty. Reproductive: The uterus is absent. The bladder is partially obscured to the right but normal where visible. Other: There is no free air, hemorrhage or fluid Musculoskeletal: T10-L4 posterior fusion and posterior bone grafting mantle, with discectomies and interbody bone plugs at L2-3 and L3-4 and with multilevel laminectomies. Right hip arthroplasty. There is osteopenia without evidence of acute skeletal abnormality. Review of the MIP images confirms the above findings. IMPRESSION: 1. No aortic aneurysm or dissection and only trace calcifications in the abdominal segment. 2. There is no significant visceral artery stenosis, but the right kidney is noted with asymmetric chronic volume loss and upper pole scarring as before. 3. Mild cardiomegaly with a right chamber predominance and a slight bowing of the interventricular septum to the left. This may be seen with right heart strain and there are prominent superior pulmonary veins as well, but no findings of pulmonary edema or pleural effusions. 4. Prior Nissen procedure with small hiatal hernia and patulous esophagus with fluid retention distally. 5. Nonspecific jejunal enteritis or enteropathy, and in the left lower abdomen mildly dilated small bowel is seen which could be due to an ileus or low-grade obstruction. There is no visible transitional segment. Constipation. 6. Prior cholecystectomy with a prominent common bile duct. Laboratory and clinical correlation advised. 7. Posterior fusion  and bone grafting T10-L4 with discectomies and interbody bone plugs at L2-3 and L3-4, appears chronic. 8. Additional findings described above. Electronically Signed   By: Telford Nab M.D.   On: 01/14/2021 03:06    Anti-infectives: Anti-infectives (From admission, onward)    None       Assessment/Plan:  Ileus/enteritis resolving. No Complications related to her hiatus hernia repair Surgical intervention.  May start advancing diet as tolerated after she completes her EGD. I will be happy to follow her as an outpatient. Will be available Please note that I spent greater than 35 minutes in this encounter including coordination of her care, providing counseling and performing appropriate documentation.  Caroleen Hamman, MD, [facility]  01/15/2021

## 2021-01-15 NOTE — ED Notes (Signed)
Pt denies CP at this time- presents in NAD

## 2021-01-15 NOTE — Op Note (Signed)
[facility] Gastroenterology Patient Name: [name] Procedure Date: 01/15/2021 1:34 PM MRN: [id number] Account #: [id number] Date of Birth: 1960/07/19 Admit Type: Inpatient Age: 60 Room: [facility] ENDO ROOM 4 Gender: Female Note Status: Finalized Instrument Name: Michaelle Birks [id number] Procedure:             Upper GI endoscopy Indications:           Upper abdominal pain, Nausea Providers:             Lin Landsman MD, MD Referring MD:          Dionne Bucy. Bacigalupo (Referring MD) Medicines:             General Anesthesia Complications:         No immediate complications. Estimated blood loss: None. Procedure:             Pre-Anesthesia Assessment:                        - Prior to the procedure, a History and Physical was                         performed, and patient medications and allergies were                         reviewed. The patient is competent. The risks and                         benefits of the procedure and the sedation options and                         risks were discussed with the patient. All questions                         were answered and informed consent was obtained.                         Patient identification and proposed procedure were                         verified by the physician, the nurse, the                         anesthesiologist, the anesthetist and the technician                         in the pre-procedure area in the procedure room in the                         endoscopy suite. Mental Status Examination: alert and                         oriented. Airway Examination: normal oropharyngeal                         airway and neck mobility. Respiratory Examination:                         clear to auscultation. CV Examination: normal.  Prophylactic Antibiotics: The patient does not require                         prophylactic antibiotics. Prior Anticoagulants: The                          patient has taken Eliquis (apixaban), last dose was 1                         day prior to procedure. ASA Grade Assessment: III - A                         patient with severe systemic disease. After reviewing                         the risks and benefits, the patient was deemed in                         satisfactory condition to undergo the procedure. The                         anesthesia plan was to use general anesthesia.                         Immediately prior to administration of medications,                         the patient was re-assessed for adequacy to receive                         sedatives. The heart rate, respiratory rate, oxygen                         saturations, blood pressure, adequacy of pulmonary                         ventilation, and response to care were monitored                         throughout the procedure. The physical status of the                         patient was re-assessed after the procedure.                        After obtaining informed consent, the endoscope was                         passed under direct vision. Throughout the procedure,                         the patient's blood pressure, pulse, and oxygen                         saturations were monitored continuously. The Endoscope                         was introduced through the mouth, and advanced to  the                         proximal jejunum. The upper GI endoscopy was                         accomplished without difficulty. The patient tolerated                         the procedure well. Findings:      Diffuse and patchy mild inflammation, characterized by friability was       found in the jejunum. Biopsies were taken with a cold forceps for       histology.      The examined duodenum was normal. Biopsies were taken with a cold       forceps for histology.      Localized and striped severe inflammation characterized by congestion       (edema), erythema and friability was  found in the gastric antrum.       Biopsies were taken with a cold forceps for Helicobacter pylori testing.      Evidence of a Nissen fundoplication was found in the gastric fundus. The       wrap appeared intact. This was traversed.      Esophagogastric landmarks were identified: the gastroesophageal junction       was found at 36 cm from the incisors.      The gastroesophageal junction and examined esophagus were normal. Impression:            - Suspected jejunal inflammation characterized by                         friability. Biopsied.                        - Normal examined duodenum. Biopsied.                        - Mucosal changes suspicious for gastritis. Biopsied.                        - A Nissen fundoplication was found. The wrap appears                         intact.                        - Esophagogastric landmarks identified.                        - Normal gastroesophageal junction and esophagus. Recommendation:        - Await pathology results.                        - Perform a colonoscopy tomorrow.                        - Clear liquid diet today.                        - Continue present medications. Procedure Code(s):     --- Professional ---  04888, Esophagogastroduodenoscopy, flexible,                         transoral; with biopsy, single or multiple Diagnosis Code(s):     --- Professional ---                        K63.89, Other specified diseases of intestine                        K31.89, Other diseases of stomach and duodenum                        Z98.890, Other specified postprocedural states                        R10.10, Upper abdominal pain, unspecified                        R11.0, Nausea CPT copyright 2019 [employer]. All rights reserved. The codes documented in this report are preliminary and upon coder review may  be revised to meet current compliance requirements. Dr. Ulyess Mort Lin Landsman MD,  MD 01/15/2021 1:58:24 PM This report has been signed electronically. Number of Addenda: 0 Note Initiated On: 01/15/2021 1:34 PM Estimated Blood Loss:  Estimated blood loss: none.      [facility]

## 2021-01-15 NOTE — Progress Notes (Signed)
Taken to endoscopy for EGD.  Continue cardiac meds.  Diltiazem for A. fib as prescribed.  Restart PTA Eliquis when okay after procedure with GI.  Signed, Kate Sable, M.D. 01/15/21 [place], [place]

## 2021-01-15 NOTE — Progress Notes (Signed)
PER Dr. Posey Pronto, only start cardizem gtt if patient is sustaining 115 or higher.

## 2021-01-15 NOTE — Anesthesia Postprocedure Evaluation (Signed)
Anesthesia Post Note  Patient: KATALYN MATIN  Procedure(s) Performed: ESOPHAGOGASTRODUODENOSCOPY (EGD) WITH PROPOFOL  Patient location during evaluation: PACU Anesthesia Type: MAC Level of consciousness: awake and awake and alert Pain management: pain level controlled Vital Signs Assessment: vitals unstable and post-procedure vital signs reviewed and stable Respiratory status: spontaneous breathing and respiratory function stable Cardiovascular status: blood pressure returned to baseline and stable Anesthetic complications: no   No notable events documented.   Last Vitals:  Vitals:   01/15/21 1410 01/15/21 1420  BP: 126/72 123/83  Pulse: 74 75  Resp: 18 20  Temp:    SpO2: 97% 96%    Last Pain:  Vitals:   01/15/21 1420  TempSrc:   PainSc: 0-No pain                 Refugio Mcconico

## 2021-01-15 NOTE — Progress Notes (Addendum)
[facility] [place] NAME: [name]    MR#:  [id number]  DATE OF BIRTH:  [birth year]  SUBJECTIVE:   patient came in with significant nausea, gagging and chest tightness.   Husband at bedside in the ER. nausea and retching much improved. Continues to have epigastric discomfort. No vomiting today. REVIEW OF SYSTEMS:   Review of Systems  Constitutional:  Negative for chills, fever and weight loss.  HENT:  Negative for ear discharge, ear pain and nosebleeds.   Eyes:  Negative for blurred vision, pain and discharge.  Respiratory:  Negative for sputum production, shortness of breath, wheezing and stridor.   Cardiovascular:  Negative for chest pain, palpitations, orthopnea and PND.  Gastrointestinal:  Positive for abdominal pain and nausea. Negative for diarrhea and vomiting.  Genitourinary:  Negative for frequency and urgency.  Musculoskeletal:  Negative for back pain and joint pain.  Neurological:  Negative for sensory change, speech change, focal weakness and weakness.  Psychiatric/Behavioral:  Negative for depression and hallucinations. The patient is not nervous/anxious.   Tolerating Diet:CLD Tolerating PT:   DRUG ALLERGIES:   Allergies  Allergen Reactions   Amoxicillin Hives    She did ok w ANCEF   Chlorhexidine Gluconate Dermatitis and Hives   Clindamycin Hives   Codeine Hives   Erythromycin Hives    "mycins" in general   Penicillin G Hives    "cillins" in general   Sulfa Antibiotics Nausea And Vomiting and Hives        Levofloxacin Hives   Shellfish Allergy Hives   Decadron [Dexamethasone] Other (See Comments)    Hot flashes, insomnia, "manic" Hot flashes, insomnia, "manic"   Mangifera Indica Hives    papaya   Papaya Derivatives Hives   Betadine [Povidone Iodine] Hives        Clarithromycin Hives   Other Hives     Mango    Povidone-Iodine Hives   Prednisone Anxiety    High blood pressure, flushed, mood changes,  heart palpitations High blood pressure, flushed, mood changes, heart palpitations    VITALS:  Blood pressure 123/83, pulse 75, temperature 98.2 F (36.8 C), temperature source Temporal, resp. rate 20, height 5\' 4"  (1.626 m), weight 65.3 kg, SpO2 96 %.  PHYSICAL EXAMINATION:   Physical Exam  GENERAL:  60 y.o.-year-old patient lying in the bed with no acute distress.  LUNGS: Normal breath sounds bilaterally, no wheezing, rales, rhonchi. No use of accessory muscles of respiration.  CARDIOVASCULAR: S1, S2 normal. No murmurs, rubs, or gallops.  ABDOMEN: Soft, nontender, nondistended. EXTREMITIES: No cyanosis, clubbing or edema b/l.    NEUROLOGIC: Cranial nerves II through XII are intact. No focal Motor or sensory deficits b/l.   PSYCHIATRIC:  patient is alert and oriented x 3.  SKIN: No obvious rash, lesion, or ulcer.   LABORATORY PANEL:  CBC Recent Labs  Lab 01/14/21 0615  WBC 7.4  HGB 11.1*  HCT 33.8*  PLT 313     Chemistries  Recent Labs  Lab 01/13/21 2014 01/14/21 0150 01/14/21 0615  NA 138  --  136  K 2.9*  --  4.3  CL 102  --  104  CO2 26  --  24  GLUCOSE 144*  --  146*  BUN 12  --  12  CREATININE 1.07*  --  0.85  CALCIUM 9.1  --  8.9  MG  --    < > 2.2  AST 21  --   --  ALT 11  --   --   ALKPHOS 108  --   --   BILITOT 0.8  --   --    < > = values in this interval not displayed.    Cardiac Enzymes No results for input(s): TROPONINI in the last 168 hours. RADIOLOGY:  DG Abdomen 1 View  Result Date: 01/14/2021 CLINICAL DATA:  60 year old female NG tube placement. EXAM: ABDOMEN - 1 VIEW COMPARISON:  CT Abdomen and Pelvis 0221 hours today. FINDINGS: AP upright view at 0447 hours. Enteric feeding tube placed into the stomach, tip at the level of the mid gastric body. Side hole is at the level of the fundus. Prior fundoplication seen by CT today. Stable cholecystectomy clips. Extensive thoracolumbar spinal fusion hardware again noted. Stable bowel gas pattern.  No pneumoperitoneum. Negative lung bases. IMPRESSION: Enteric feeding tube placed into the stomach, side hole at the level of the fundus. Electronically Signed   By: Genevie Ann M.D.   On: 01/14/2021 05:31   DG Chest Portable 1 View  Result Date: 01/13/2021 CLINICAL DATA:  Palpitations and chest pain. EXAM: PORTABLE CHEST 1 VIEW COMPARISON:  February 27, 2020 FINDINGS: The heart size and mediastinal contours are within normal limits. Both lungs are clear. Stable postoperative changes are seen within the mid to lower cervical spine, lower thoracic spine and upper lumbar spine. IMPRESSION: Stable exam without acute cardiopulmonary disease. Electronically Signed   By: Virgina Norfolk M.D.   On: 01/13/2021 20:33   DG Abd 2 Views  Result Date: 01/14/2021 CLINICAL DATA:  60 year old female with ileus. EXAM: ABDOMEN - 2 VIEW COMPARISON:  Earlier the same day FINDINGS: Slight interval retraction of indwelling gastric decompression tube, now with the proximal side hole just proximal to the gastroesophageal junction in the tip in the gastric fundus. Similar appearing nonspecific bowel gas pattern. Cholecystectomy clips in right upper quadrant. Similar appearing thoracolumbar fusion hardware and partially visualized right total hip arthroplasty. IMPRESSION: 1. Slight interval retraction of the indwelling gastric decompression tube with the proximal side hole now proximal to the gastroesophageal junction. 2. Similar appearing nonspecific bowel gas pattern. Electronically Signed   By: Ruthann Cancer M.D.   On: 01/14/2021 10:16   DG ABD ACUTE 2+V W 1V CHEST  Result Date: 01/15/2021 CLINICAL DATA:  Tachycardia. Shortness of breath. Nausea. Previous ablation for atrial fibrillation. EXAM: DG ABDOMEN ACUTE WITH 1 VIEW CHEST COMPARISON:  01/14/2021 and 01/13/2021 FINDINGS: There is no evidence of dilated bowel loops or free intraperitoneal air. No radiopaque calculi or other significant radiographic abnormality is seen.  Surgical clips again seen from prior cholecystectomy. Thoracolumbar spine fusion hardware and right hip prosthesis again noted. Heart size and mediastinal contours are within normal limits. Both lungs are clear. Cervical spine fusion hardware is also noted. IMPRESSION: Unremarkable bowel gas pattern.  No acute findings. No active cardiopulmonary disease. Electronically Signed   By: Marlaine Hind M.D.   On: 01/15/2021 08:53   CT Angio Chest/Abd/Pel for Dissection W and/or Wo Contrast  Result Date: 01/14/2021 CLINICAL DATA:  Prior Nissen fundoplication with patulous esophagus with fluid retention in the distal esophagus. EXAM: CT ANGIOGRAPHY CHEST, ABDOMEN AND PELVIS TECHNIQUE: Non-contrast CT of the chest was initially obtained. Multidetector CT imaging through the chest, abdomen and pelvis was performed using the standard protocol during bolus administration of intravenous contrast. Multiplanar reconstructed images and MIPs were obtained and reviewed to evaluate the vascular anatomy. CONTRAST:  85mL OMNIPAQUE IOHEXOL 350 MG/ML SOLN COMPARISON:  Noncontrast abdomen and  pelvis CTs 05/01/2020 and 08/28/2016. No prior chest CT for comparison FINDINGS: CTA CHEST FINDINGS Cardiovascular: There is mild panchamber cardiomegaly but with a slight right chamber predominance and bowing of the interventricular septum towards the left which may be seen with right heart strain. There is distension of the superior pulmonary veins as well. There is no pericardial effusion, no pulmonary arterial dilatation or central embolus. There are no appreciable coronary artery calcifications. No aortic aneurysm or dissection is seen and no significant visible plaques with mild descending tortuosity. The great vessels are normal. Mediastinum/Nodes: No enlarged mediastinal, hilar, or axillary lymph nodes. Thyroid gland, trachea, demonstrate no significant findings. There is a mildly patulous esophagus with scattered fluid in the distal  esophagus and a small hiatus hernia. Lungs/Pleura: The lungs are clear of infiltrates. There are scattered linear scar-like opacities in both bases. There is no pleural effusion, thickening or pneumothorax. Central airways in the trachea are clear. Musculoskeletal: There is fusion plating partially visible in the lower cervical spine. Multilevel Harrington rod fusion begins at T10. There is no worrisome regional skeletal lesion. Review of the MIP images confirms the above findings. CTA ABDOMEN AND PELVIS FINDINGS VASCULAR Aorta: Normal caliber aorta without aneurysm, dissection, vasculitis or significant stenosis. There are scattered trace wall calcifications. Celiac: Patent without evidence of aneurysm, dissection, vasculitis or significant stenosis. SMA: Patent without evidence of aneurysm, dissection, vasculitis or significant stenosis. Renals: There are bilateral single renal arteries and neither demonstrate significant plaques or stenosis. IMA: Normal. Inflow: Common iliac, both internal and external iliac arteries are clear. Veins: The SVC is well opacified but the IVC and iliac veins are unopacified at the time of imaging. The portal vein is fairly well opacified and normal caliber. Review of the MIP images confirms the above findings. NON-VASCULAR: Abdominal viscera not optimally visualized due to extensive metal artifact from multilevel posterior rod fusion, T10-L4. Hepatobiliary: No obvious mass enhancement in the liver. Gallbladder is absent and the common bile duct is dilated measuring 12 mm without a visible obvious filling defect Pancreas: Partially atrophic, otherwise grossly unremarkable. Spleen: No enlargement or mass enhancement. Adrenals/Urinary Tract: There is no adrenal mass. Asymmetric right renal cortical volume loss and upper pole cortical scarring are again shown. There is no renal mass, calculus or hydronephrosis Stomach/Bowel: Small hiatal hernia and evidence of Nissen fundoplication.  Mucosal thickening in the jejunal segments in the left abdomen is noted. There is mild dilatation of the left lower quadrant small bowel to up to 2.7 cm and relatively decompressed pelvic and right abdominal small bowel but no visible transitional segment. The appendix is normal caliber. There is moderate to severe stool retention in the ascending colon. Left colonic diverticula without evidence of diverticulitis Lymphatic: No adenopathy is seen through the metallic artifact, additional metal artifact of the pelvis from right hip arthroplasty. Reproductive: The uterus is absent. The bladder is partially obscured to the right but normal where visible. Other: There is no free air, hemorrhage or fluid Musculoskeletal: T10-L4 posterior fusion and posterior bone grafting mantle, with discectomies and interbody bone plugs at L2-3 and L3-4 and with multilevel laminectomies. Right hip arthroplasty. There is osteopenia without evidence of acute skeletal abnormality. Review of the MIP images confirms the above findings. IMPRESSION: 1. No aortic aneurysm or dissection and only trace calcifications in the abdominal segment. 2. There is no significant visceral artery stenosis, but the right kidney is noted with asymmetric chronic volume loss and upper pole scarring as before. 3. Mild cardiomegaly with a  right chamber predominance and a slight bowing of the interventricular septum to the left. This may be seen with right heart strain and there are prominent superior pulmonary veins as well, but no findings of pulmonary edema or pleural effusions. 4. Prior Nissen procedure with small hiatal hernia and patulous esophagus with fluid retention distally. 5. Nonspecific jejunal enteritis or enteropathy, and in the left lower abdomen mildly dilated small bowel is seen which could be due to an ileus or low-grade obstruction. There is no visible transitional segment. Constipation. 6. Prior cholecystectomy with a prominent common bile  duct. Laboratory and clinical correlation advised. 7. Posterior fusion and bone grafting T10-L4 with discectomies and interbody bone plugs at L2-3 and L3-4, appears chronic. 8. Additional findings described above. Electronically Signed   By: Telford Nab M.D.   On: 01/14/2021 03:06   ASSESSMENT AND PLAN:  YAMILETTE GARRETSON is a 60 y.o. Caucasian female with medical history significant for multiple medical problems including paroxysmal atrial fibrillation status postcardiac ablation in May of this year, asthma, anxiety and depression, who presents emergency room with the onset of palpitations with associated midsternal chest pain as well as significant nausea and epigastric abdominal pain.  Ileus/low-grade SBO/with nausea and vomiting history of gastritis -- seen by surgery. NG tube removed since patient's nausea gotten worse. Not much NG output. -- Continue IV fluids, clear liquid, antibiotics -- patient did have a BM today passing gas.   Intractable nausea -- G.I. consultation with Dr. Marius Ditch -- IV Protonix BID -- IV fluids and PRN antiemetics -- patient had EGD in 2021-- showed small hiatal hernia, gastritis --11/5 EGD -- Suspected jejunal inflammation characterized by                         friability. Biopsied.                        - Normal examined duodenum. Biopsied.                        - Mucosal changes suspicious for gastritis. Biopsied.             - A Nissen fundoplication was found.                         Esophagogastric landmarks identified.                        - Normal gastroesophageal junction and esophagus --colonoscopy in am  atrial fibrillation with RVR acute on chronic chronic anticoagulation -- patient was on IV diltiazem drip now discontinued. Heart rate in the 70s.-- Seen by Dr. Rockey Situ from [facility] MG recommends continue present rate blocking agents -- patient has had ablation in May 2022 -- will resume eliquis after GI w/u  Anxiety -- continue BuSpar and  Xanax  chronic pain syndrome -- on morphine given through the pain clinic. Patient follows with Dr. Dossie Arbour  asthma without exacerbation -- continue Singulair    Procedures: Family communication : none Consults : G.I., surgery CODE STATUS: full DVT Prophylaxis : eliquis Level of care: Med-Surg Status is: Inpatient  Remains inpatient appropriate because: nausea abdominal pain        TOTAL TIME TAKING CARE OF THIS PATIENT: 25 minutes.  >50% time spent on counselling and coordination of care  Note: This dictation was prepared with Dragon dictation along  with smaller phrase technology. Any transcriptional errors that result from this process are unintentional.  Fritzi Mandes M.D    [facility]   CC: Primary care physician; Virginia Crews, MD Patient ID: [name], female   DOB: March 08, 1961, 60 y.o.   MRN: [id number]

## 2021-01-15 NOTE — Transfer of Care (Signed)
Immediate Anesthesia Transfer of Care Note  Patient: [name]  Procedure(s) Performed: ESOPHAGOGASTRODUODENOSCOPY (EGD) WITH PROPOFOL  Patient Location: Endoscopy Unit  Anesthesia Type:General  Level of Consciousness: drowsy  Airway & Oxygen Therapy: Patient Spontanous Breathing and Patient connected to nasal cannula oxygen  Post-op Assessment: Report given to RN and Post -op Vital signs reviewed and stable  Post vital signs: Reviewed and stable  Last Vitals:  Vitals Value Taken Time  BP 111/70   Temp    Pulse 68   Resp 18   SpO2 97     Last Pain:  Vitals:   01/15/21 1224  TempSrc: Temporal  PainSc: 2          Complications: No notable events documented.

## 2021-01-15 NOTE — Progress Notes (Signed)
Pt states she just had a large amount of diarrhea in the bathroom. No complaints.

## 2021-01-15 NOTE — Anesthesia Preprocedure Evaluation (Addendum)
Anesthesia Evaluation  Patient identified by MRN, date of birth, ID band  History of Anesthesia Complications (+) PONV and history of anesthetic complications  Airway Mallampati: II  TM Distance: >3 FB     Dental no notable dental hx.    Pulmonary asthma , sleep apnea and Continuous Positive Airway Pressure Ventilation , pneumonia,    Pulmonary exam normal        Cardiovascular hypertension, + angina + DOE  Normal cardiovascular exam     Neuro/Psych  Headaches, PSYCHIATRIC DISORDERS Anxiety Depression  Neuromuscular disease    GI/Hepatic PUD, GERD  ,  Endo/Other    Renal/GU Renal disease  negative genitourinary   Musculoskeletal  (+) Arthritis ,   Abdominal   Peds  Hematology  (+) anemia ,   Anesthesia Other Findings [name] is a 60 y.o. female with history of A. fib/flutter s/p ablation on Eliquis, OSA, GERD s/p paraesophageal hernia repair and Nissen's fundoplication in 20/25 is admitted with A. fib with RVR, abdominal discomfort and nausea, CT revealed possible small bowel ileus and enteritis  Small bowel ileus and enteritis resulting in abdominal discomfort and nausea  afib currently on Eliquis--s/p ablation 6 months ago, has had 5 cardioversion, per patient  Initial K was 2.9, now 4.3  Reproductive/Obstetrics negative OB ROS                           Anesthesia Physical Anesthesia Plan  ASA: 3  Anesthesia Plan: MAC   Post-op Pain Management:    Induction:   PONV Risk Score and Plan: 3 and Ondansetron, TIVA and Midazolam  Airway Management Planned:   Additional Equipment:   Intra-op Plan:   Post-operative Plan:   Informed Consent:   Plan Discussed with: CRNA  Anesthesia Plan Comments: (preop Marcine Matar was given)       Anesthesia Quick Evaluation

## 2021-01-15 NOTE — Anesthesia Procedure Notes (Signed)
Date/Time: 01/15/2021 1:45 PM Performed by: Doreen Salvage, CRNA Pre-anesthesia Checklist: Patient identified, Emergency Drugs available, Suction available and Patient being monitored Patient Re-evaluated:Patient Re-evaluated prior to induction Oxygen Delivery Method: Nasal cannula Induction Type: IV induction Dental Injury: Teeth and Oropharynx as per pre-operative assessment  Comments: Nasal cannula with etCO2 monitoring

## 2021-01-15 NOTE — Progress Notes (Signed)
   01/15/21 1845  Assess: MEWS Score  Temp (!) 101.6 F (38.7 C)  BP 125/83  Pulse Rate (!) 104  Resp 16  Level of Consciousness Alert  SpO2 100 %  O2 Device Room Air  Assess: MEWS Score  MEWS Temp 2  MEWS Systolic 0  MEWS Pulse 1  MEWS RR 0  MEWS LOC 0  MEWS Score 3  MEWS Score Color Yellow  Assess: if the MEWS score is Yellow or Red  Were vital signs taken at a resting state? Yes  Focused Assessment No change from prior assessment  Does the patient meet 2 or more of the SIRS criteria? Yes  Does the patient have a confirmed or suspected source of infection? No  MEWS guidelines implemented *See Row Information* Yes  Treat  MEWS Interventions Administered prn meds/treatments  Take Vital Signs  Increase Vital Sign Frequency  Yellow: Q 2hr X 2 then Q 4hr X 2, if remains yellow, continue Q 4hrs  Escalate  MEWS: Escalate Yellow: discuss with charge nurse/RN and consider discussing with provider and RRT  Notify: Charge Nurse/RN  Name of Charge Nurse/RN Notified Claiborne Billings RN  Date Charge Nurse/RN Notified 01/15/21  Time Charge Nurse/RN Notified 1845  Notify: Provider  Provider Name/Title Dr. Posey Pronto  Date Provider Notified 01/15/21  Time Provider Notified 1845  Notification Type Page  Provider response No new orders  Date of Provider Response 01/15/21  Time of Provider Response 1845  Document  Patient Outcome Transferred/level of care increased  Progress note created (see row info) Yes  Assess: SIRS CRITERIA  SIRS Temperature  1  SIRS Pulse 1  SIRS Respirations  0  SIRS WBC 0  SIRS Score Sum  2

## 2021-01-16 ENCOUNTER — Encounter
Admission: EM | Disposition: A | Discharge status: Home / Self Care | Payer: Self-pay | Source: Home / Self Care | Attending: Internal Medicine

## 2021-01-16 ENCOUNTER — Inpatient Hospital Stay: Payer: Medicare Other | Admitting: Registered Nurse

## 2021-01-16 DIAGNOSIS — I10 Essential (primary) hypertension: Secondary | ICD-10-CM

## 2021-01-16 DIAGNOSIS — R197 Diarrhea, unspecified: Secondary | ICD-10-CM

## 2021-01-16 DIAGNOSIS — R1031 Right lower quadrant pain: Secondary | ICD-10-CM | POA: Diagnosis not present

## 2021-01-16 DIAGNOSIS — K567 Ileus, unspecified: Secondary | ICD-10-CM | POA: Diagnosis not present

## 2021-01-16 DIAGNOSIS — G8929 Other chronic pain: Secondary | ICD-10-CM | POA: Diagnosis not present

## 2021-01-16 DIAGNOSIS — K2900 Acute gastritis without bleeding: Secondary | ICD-10-CM | POA: Diagnosis not present

## 2021-01-16 DIAGNOSIS — K529 Noninfective gastroenteritis and colitis, unspecified: Secondary | ICD-10-CM | POA: Diagnosis not present

## 2021-01-16 DIAGNOSIS — I4891 Unspecified atrial fibrillation: Secondary | ICD-10-CM | POA: Diagnosis not present

## 2021-01-16 HISTORY — PX: COLONOSCOPY WITH PROPOFOL: SHX5780

## 2021-01-16 SURGERY — COLONOSCOPY WITH PROPOFOL
Anesthesia: Monitor Anesthesia Care

## 2021-01-16 SURGERY — COLONOSCOPY WITH PROPOFOL
Anesthesia: General

## 2021-01-16 MED ORDER — PROPOFOL 500 MG/50ML IV EMUL
INTRAVENOUS | Status: DC | PRN
Start: 2021-01-16 — End: 2021-01-16
  Administered 2021-01-16: 120 ug/kg/min via INTRAVENOUS

## 2021-01-16 MED ORDER — PROPOFOL 10 MG/ML IV BOLUS
INTRAVENOUS | Status: DC | PRN
  Administered 2021-01-16: 70 mg via INTRAVENOUS

## 2021-01-16 MED ORDER — LIDOCAINE HCL (CARDIAC) PF 100 MG/5ML IV SOSY
PREFILLED_SYRINGE | INTRAVENOUS | Status: DC | PRN
Start: 2021-01-16 — End: 2021-01-16
  Administered 2021-01-16: 80 mg via INTRAVENOUS

## 2021-01-16 NOTE — Transfer of Care (Signed)
Immediate Anesthesia Transfer of Care Note  Patient: FELITA BUMP  Procedure(s) Performed: COLONOSCOPY WITH PROPOFOL  Patient Location: PACU  Anesthesia Type:General  Level of Consciousness: sedated  Airway & Oxygen Therapy: Patient Spontanous Breathing  Post-op Assessment: Report given to RN and Post -op Vital signs reviewed and stable  Post vital signs: Reviewed and stable  Last Vitals:  Vitals Value Taken Time  BP 95/57 01/16/21 1213  Temp    Pulse 68 01/16/21 1213  Resp 9 01/16/21 1213  SpO2 96 % 01/16/21 1213    Last Pain:  Vitals:   01/16/21 1213  TempSrc:   PainSc: 0-No pain      Patients Stated Pain Goal: 4 (18/84/16 6063)  Complications: No notable events documented.

## 2021-01-16 NOTE — Op Note (Signed)
[facility] Patient Name: [name] Procedure Date: 01/16/2021 11:07 AM MRN: [id number] Account #: [id number] Date of Birth: 02-Oct-1960 Admit Type: Inpatient Age: 60 Room: [facility] ENDO ROOM 4 Gender: Female Note Status: Finalized Instrument Name: Colonoscope [id number] Procedure:             Colonoscopy Indications:           Last colonoscopy: November 2018, Abdominal pain in the                         right lower quadrant Providers:             Lin Landsman MD, MD Referring MD:          Dionne Bucy. Bacigalupo (Referring MD) Medicines:             General Anesthesia Complications:         No immediate complications. Estimated blood loss: None. Procedure:             Pre-Anesthesia Assessment:                        - Prior to the procedure, a History and Physical was                         performed, and patient medications and allergies were                         reviewed. The patient is competent. The risks and                         benefits of the procedure and the sedation options and                         risks were discussed with the patient. All questions                         were answered and informed consent was obtained.                         Patient identification and proposed procedure were                         verified by the physician, the nurse, the                         anesthesiologist, the anesthetist and the technician                         in the pre-procedure area in the procedure room in the                         endoscopy suite. Mental Status Examination: alert and                         oriented. Airway Examination: normal oropharyngeal                         airway and neck mobility. Respiratory Examination:  clear to auscultation. CV Examination: irregularly                         irregular rate and rhythm. ASA Grade Assessment: III -                         A patient  with severe systemic disease. After                         reviewing the risks and benefits, the patient was                         deemed in satisfactory condition to undergo the                         procedure. The anesthesia plan was to use general                         anesthesia. Immediately prior to administration of                         medications, the patient was re-assessed for adequacy                         to receive sedatives. The heart rate, respiratory                         rate, oxygen saturations, blood pressure, adequacy of                         pulmonary ventilation, and response to care were                         monitored throughout the procedure. The physical                         status of the patient was re-assessed after the                         procedure.                        After obtaining informed consent, the colonoscope was                         passed under direct vision. Throughout the procedure,                         the patient's blood pressure, pulse, and oxygen                         saturations were monitored continuously. The                         Colonoscope was introduced through the anus and                         advanced to the 10 cm into the ileum. The colonoscopy  was performed with moderate difficulty due to                         restricted mobility of the colon. Successful                         completion of the procedure was aided by withdrawing                         the scope and replacing with the pediatric                         colonoscope. The patient tolerated the procedure well.                         The quality of the bowel preparation was fair. Findings:      The perianal and digital rectal examinations were normal. Pertinent       negatives include normal sphincter tone and no palpable rectal lesions.      The terminal ileum appeared normal.      The entire examined  colon appeared normal. Biopsies for histology were       taken with a cold forceps from the entire colon for evaluation of       microscopic colitis.      The retroflexed view of the distal rectum and anal verge was normal and       showed no anal or rectal abnormalities. Impression:            - Preparation of the colon was fair.                        - The examined portion of the ileum was normal.                        - The entire examined colon is normal. Biopsied.                        - The distal rectum and anal verge are normal on                         retroflexion view. Recommendation:        - Discharge patient to home (with escort).                        - Resume previous diet today.                        - Continue present medications.                        - Await pathology results. Procedure Code(s):     --- Professional ---                        [phone], Colonoscopy, flexible; with biopsy, single or                         multiple Diagnosis Code(s):     --- Professional ---  R10.31, Right lower quadrant pain CPT copyright 2019 American Medical Association. All rights reserved. The codes documented in this report are preliminary and upon coder review may  be revised to meet current compliance requirements. Dr. Ulyess Mort Lin Landsman MD, MD 01/16/2021 12:09:38 PM This report has been signed electronically. Number of Addenda: 0 Note Initiated On: 01/16/2021 11:07 AM Scope Withdrawal Time: 0 hours 9 minutes 36 seconds  Total Procedure Duration: 0 hours 20 minutes 1 second  Estimated Blood Loss:  Estimated blood loss: none.      [facility]

## 2021-01-16 NOTE — Anesthesia Preprocedure Evaluation (Signed)
Anesthesia Evaluation  Patient identified by MRN, date of birth, ID band  History of Anesthesia Complications (+) PONV and history of anesthetic complications  Airway Mallampati: II  TM Distance: >3 FB     Dental no notable dental hx.    Pulmonary asthma , sleep apnea and Continuous Positive Airway Pressure Ventilation , pneumonia,    Pulmonary exam normal        Cardiovascular hypertension, + angina + DOE  Normal cardiovascular exam     Neuro/Psych  Headaches, PSYCHIATRIC DISORDERS Anxiety Depression  Neuromuscular disease    GI/Hepatic PUD, GERD  ,  Endo/Other    Renal/GU Renal disease  negative genitourinary   Musculoskeletal  (+) Arthritis ,   Abdominal   Peds  Hematology  (+) anemia ,   Anesthesia Other Findings [name] is a 60 y.o. female with history of A. fib/flutter s/p ablation on Eliquis, OSA, GERD s/p paraesophageal hernia repair and Nissen's fundoplication in 93/71 is admitted with A. fib with RVR, abdominal discomfort and nausea, CT revealed possible small bowel ileus and enteritis.  Had EGD yesterday.   Small bowel ileus and enteritis resulting in abdominal discomfort and nausea  afib currently on Eliquis--s/p ablation 6 months ago, has had 5 cardioversion, per patient  Initial K was 2.9, now 4.3  Reproductive/Obstetrics negative OB ROS                             Anesthesia Physical Anesthesia Plan  ASA: 3 and emergent  Anesthesia Plan: MAC   Post-op Pain Management:    Induction: Intravenous  PONV Risk Score and Plan:   Airway Management Planned: Simple Face Mask  Additional Equipment:   Intra-op Plan:   Post-operative Plan:   Informed Consent: I have reviewed the patients History and Physical, chart, labs and discussed the procedure including the risks, benefits and alternatives for the proposed anesthesia with the patient or authorized  representative who has indicated his/her understanding and acceptance.       Plan Discussed with:   Anesthesia Plan Comments:         Anesthesia Quick Evaluation

## 2021-01-16 NOTE — Progress Notes (Signed)
[place] [facility] [place] NAME: [name]    MR#:  [id number]  DATE OF BIRTH:  [birth year]  SUBJECTIVE:   patient came in with significant nausea, gagging and chest tightness.   Patient's heart rate much better today. She underwent colonoscopy earlier today. No nausea vomiting. REVIEW OF SYSTEMS:   Review of Systems  Constitutional:  Negative for chills, fever and weight loss.  HENT:  Negative for ear discharge, ear pain and nosebleeds.   Eyes:  Negative for blurred vision, pain and discharge.  Respiratory:  Negative for sputum production, shortness of breath, wheezing and stridor.   Cardiovascular:  Negative for chest pain, palpitations, orthopnea and PND.  Gastrointestinal:  Positive for abdominal pain and nausea. Negative for diarrhea and vomiting.  Genitourinary:  Negative for frequency and urgency.  Musculoskeletal:  Negative for back pain and joint pain.  Neurological:  Negative for sensory change, speech change, focal weakness and weakness.  Psychiatric/Behavioral:  Negative for depression and hallucinations. The patient is not nervous/anxious.   Tolerating Diet:CLD Tolerating PT:   DRUG ALLERGIES:   Allergies  Allergen Reactions   Amoxicillin Hives    She did ok w ANCEF   Chlorhexidine Gluconate Dermatitis and Hives   Clindamycin Hives   Codeine Hives   Erythromycin Hives    "mycins" in general   Penicillin G Hives    "cillins" in general   Sulfa Antibiotics Nausea And Vomiting and Hives        Levofloxacin Hives   Shellfish Allergy Hives   Decadron [Dexamethasone] Other (See Comments)    Hot flashes, insomnia, "manic" Hot flashes, insomnia, "manic"   Mangifera Indica Hives    papaya   Papaya Derivatives Hives   Betadine [Povidone Iodine] Hives        Clarithromycin Hives   Other Hives     Mango    Povidone-Iodine Hives   Prednisone Anxiety    High blood pressure, flushed, mood changes, heart  palpitations High blood pressure, flushed, mood changes, heart palpitations    VITALS:  Blood pressure 100/62, pulse 65, temperature 98.2 F (36.8 C), temperature source Temporal, resp. rate (!) 8, height 5\' 4"  (1.626 m), weight 65.3 kg, SpO2 95 %.  PHYSICAL EXAMINATION:   Physical Exam  GENERAL:  60 y.o.-year-old patient lying in the bed with no acute distress.  LUNGS: Normal breath sounds bilaterally, no wheezing, rales, rhonchi. No use of accessory muscles of respiration.  CARDIOVASCULAR: S1, S2 normal. No murmurs, rubs, or gallops.  ABDOMEN: Soft, nontender, nondistended. EXTREMITIES: No cyanosis, clubbing or edema b/l.    NEUROLOGIC: Cranial nerves II through XII are intact. No focal Motor or sensory deficits b/l.   PSYCHIATRIC:  patient is alert and oriented x 3.  SKIN: No obvious rash, lesion, or ulcer.   LABORATORY PANEL:  CBC Recent Labs  Lab 01/14/21 0615  WBC 7.4  HGB 11.1*  HCT 33.8*  PLT 313     Chemistries  Recent Labs  Lab 01/13/21 2014 01/14/21 0150 01/14/21 0615  NA 138  --  136  K 2.9*  --  4.3  CL 102  --  104  CO2 26  --  24  GLUCOSE 144*  --  146*  BUN 12  --  12  CREATININE 1.07*  --  0.85  CALCIUM 9.1  --  8.9  MG  --    < > 2.2  AST 21  --   --   ALT 11  --   --  ALKPHOS 108  --   --   BILITOT 0.8  --   --    < > = values in this interval not displayed.    Cardiac Enzymes No results for input(s): TROPONINI in the last 168 hours. RADIOLOGY:  DG ABD ACUTE 2+V W 1V CHEST  Result Date: 01/15/2021 CLINICAL DATA:  Tachycardia. Shortness of breath. Nausea. Previous ablation for atrial fibrillation. EXAM: DG ABDOMEN ACUTE WITH 1 VIEW CHEST COMPARISON:  01/14/2021 and 01/13/2021 FINDINGS: There is no evidence of dilated bowel loops or free intraperitoneal air. No radiopaque calculi or other significant radiographic abnormality is seen. Surgical clips again seen from prior cholecystectomy. Thoracolumbar spine fusion hardware and right hip  prosthesis again noted. Heart size and mediastinal contours are within normal limits. Both lungs are clear. Cervical spine fusion hardware is also noted. IMPRESSION: Unremarkable bowel gas pattern.  No acute findings. No active cardiopulmonary disease. Electronically Signed   By: Marlaine Hind M.D.   On: 01/15/2021 08:53   ASSESSMENT AND PLAN:  [name] is a 60 y.o. Caucasian female with medical history significant for multiple medical problems including paroxysmal atrial fibrillation status postcardiac ablation in May of this year, asthma, anxiety and depression, who presents emergency room with the onset of palpitations with associated midsternal chest pain as well as significant nausea and epigastric abdominal pain.  Ileus/low-grade SBO/with nausea and vomiting history of gastritis -- seen by surgery. NG tube removed since patient's nausea gotten worse. Not much NG output. -- Continue IV fluids, clear liquid, antibiotics -- patient did have a BM today passing gas.   Intractable nausea -- G.I. consultation with Dr. Marius Ditch -- IV Protonix BID -- IV fluids and PRN antiemetics -- patient had EGD in 2021-- showed small hiatal hernia, gastritis --11/5 EGD -- Suspected jejunal inflammation characterized by                         friability. Biopsied.                        - Normal examined duodenum. Biopsied.                        - Mucosal changes suspicious for gastritis. Biopsied.             - A Nissen fundoplication was found.                         Esophagogastric landmarks identified.                        - Normal gastroesophageal junction and esophagus --colonoscopy normal  atrial fibrillation with RVR acute on chronic chronic anticoagulation -- patient was on IV diltiazem drip now discontinued. Heart rate in the 70s. -- Seen by Dr. Rockey Situ from [facility] MG recommends continue present rate blocking agents -- patient has had ablation in May 2022 -- will resume eliquis at discharge  per GI rec  Anxiety -- continue BuSpar and Xanax  chronic pain syndrome -- on morphine given through the pain clinic. Patient follows with Dr. Dossie Arbour  asthma without exacerbation -- continue Singulair    Procedures: EGD, colonoscopy Family communication : none today Consults : G.I., surgery CODE STATUS: full DVT Prophylaxis : eliquis Level of care: Progressive Cardiac Status is: Inpatient  monitor one more day for atrial fibrillation/heart  TOTAL TIME TAKING CARE OF THIS PATIENT: 25 minutes.  >50% time spent on counselling and coordination of care  Note: This dictation was prepared with Dragon dictation along with smaller phrase technology. Any transcriptional errors that result from this process are unintentional.  Fritzi Mandes M.D    [facility]   CC: Primary care physician; Virginia Crews, MD Patient ID: [name], female   DOB: 1960-08-23, 60 y.o.   MRN: [id number]

## 2021-01-16 NOTE — Anesthesia Postprocedure Evaluation (Signed)
Anesthesia Post Note  Patient: [name]  Procedure(s) Performed: COLONOSCOPY WITH PROPOFOL  Patient location during evaluation: PACU Anesthesia Type: MAC Level of consciousness: awake and alert Pain management: pain level controlled Vital Signs Assessment: post-procedure vital signs reviewed and stable Respiratory status: spontaneous breathing, nonlabored ventilation, respiratory function stable and patient connected to nasal cannula oxygen Cardiovascular status: blood pressure returned to baseline and stable Postop Assessment: no apparent nausea or vomiting Anesthetic complications: no   No notable events documented.   Last Vitals:  Vitals:   01/16/21 1213 01/16/21 1215  BP: (!) 95/57 100/62  Pulse: 68 65  Resp: (!) 9 (!) 8  Temp:    SpO2: 96% 95%    Last Pain:  Vitals:   01/16/21 1215  TempSrc:   PainSc: 0-No pain                 Abshir Paolini

## 2021-01-16 NOTE — Progress Notes (Signed)
Progress Note  Patient Name: [name] Date of Encounter: 01/16/2021  [facility] Cardiologist: Kate Sable, MD   Subjective   Denies palpitations, currently in sinus rhythm, has some abdominal discomfort.  EGD was performed yesterday, patient went into A. fib RVR after EGD procedure, started on IV Cardizem with conversion to sinus rhythm.  Colonoscopy being planned today.  Inpatient Medications    Scheduled Meds:  [MAR Hold] ALPRAZolam  1 mg Oral QHS   [MAR Hold] busPIRone  7.5 mg Oral BID   [MAR Hold] celecoxib  200 mg Oral BID   [MAR Hold] diltiazem  120 mg Oral Daily   influenza vac split quadrivalent PF  0.5 mL Intramuscular Tomorrow-1000   [MAR Hold] linaclotide  290 mcg Oral QAC breakfast   [MAR Hold] montelukast  10 mg Oral Daily   [MAR Hold] pantoprazole (PROTONIX) IV  40 mg Intravenous Q12H   [MAR Hold] polyethylene glycol  17 g Oral Daily   Continuous Infusions:  sodium chloride 20 mL/hr at 01/16/21 1136   [MAR Hold] iron sucrose     [MAR Hold] promethazine (PHENERGAN) injection (IM or IVPB) Stopped (01/14/21 1628)   PRN Meds: [MAR Hold] acetaminophen, [MAR Hold] metoprolol tartrate, [MAR Hold] morphine, [MAR Hold] promethazine (PHENERGAN) injection (IM or IVPB), [MAR Hold] sodium chloride   Vital Signs    Vitals:   01/16/21 0400 01/16/21 0521 01/16/21 0742 01/16/21 1104  BP: 97/64  115/71 112/86  Pulse: 71 78 82 67  Resp: 17  16 20   Temp:  98.6 F (37 C) 99.6 F (37.6 C) 98.2 F (36.8 C)  TempSrc:   Oral Temporal  SpO2:  99% 94% 99%  Weight:      Height:        Intake/Output Summary (Last 24 hours) at 01/16/2021 1153 Last data filed at 01/15/2021 1903 Gross per 24 hour  Intake 70.82 ml  Output --  Net 70.82 ml   Last 3 Weights 01/15/2021 01/13/2021 11/30/2020  Weight (lbs) 144 lb 144 lb 140 lb  Weight (kg) 65.318 kg 65.318 kg 63.504 kg  Some encounter information is confidential and restricted. Go to Review Flowsheets activity to  see all data.      Telemetry    Normal sinus rhythm, heart rate 71- Personally Reviewed  ECG     - Personally Reviewed  Physical Exam   GEN: No acute distress.   Neck: No JVD Cardiac: RRR, no murmurs, rubs, or gallops.  Respiratory: Clear to auscultation bilaterally. GI: Soft, mild discomfort non-distended  MS: No edema; No deformity. Neuro:  Nonfocal  Psych: Normal affect   Labs    High Sensitivity Troponin:   Recent Labs  Lab 01/13/21 2014 01/13/21 2225 01/14/21 0150 01/14/21 0615  TROPONINIHS 11 11 9 9      Chemistry Recent Labs  Lab 01/13/21 2014 01/14/21 0150 01/14/21 0615  NA 138  --  136  K 2.9*  --  4.3  CL 102  --  104  CO2 26  --  24  GLUCOSE 144*  --  146*  BUN 12  --  12  CREATININE 1.07*  --  0.85  CALCIUM 9.1  --  8.9  MG  --  2.0 2.2  PROT 7.9  --   --   ALBUMIN 4.3  --   --   AST 21  --   --   ALT 11  --   --   ALKPHOS 108  --   --  BILITOT 0.8  --   --   GFRNONAA 59*  --  >60  ANIONGAP 10  --  8    Lipids No results for input(s): CHOL, TRIG, HDL, LABVLDL, LDLCALC, CHOLHDL in the last 168 hours.  Hematology Recent Labs  Lab 01/13/21 2014 01/14/21 0615  WBC 8.3 7.4  RBC 4.68 4.14  HGB 12.7 11.1*  HCT 38.6 33.8*  MCV 82.5 81.6  MCH 27.1 26.8  MCHC 32.9 32.8  RDW 13.7 13.8  PLT 353 313   Thyroid No results for input(s): TSH, FREET4 in the last 168 hours.  BNPNo results for input(s): BNP, PROBNP in the last 168 hours.  DDimer  Recent Labs  Lab 01/13/21 2013  DDIMER 0.34     Radiology    DG ABD ACUTE 2+V W 1V CHEST  Result Date: 01/15/2021 CLINICAL DATA:  Tachycardia. Shortness of breath. Nausea. Previous ablation for atrial fibrillation. EXAM: DG ABDOMEN ACUTE WITH 1 VIEW CHEST COMPARISON:  01/14/2021 and 01/13/2021 FINDINGS: There is no evidence of dilated bowel loops or free intraperitoneal air. No radiopaque calculi or other significant radiographic abnormality is seen. Surgical clips again seen from prior  cholecystectomy. Thoracolumbar spine fusion hardware and right hip prosthesis again noted. Heart size and mediastinal contours are within normal limits. Both lungs are clear. Cervical spine fusion hardware is also noted. IMPRESSION: Unremarkable bowel gas pattern.  No acute findings. No active cardiopulmonary disease. Electronically Signed   By: Marlaine Hind M.D.   On: 01/15/2021 08:53    Cardiac Studies   Echo 07/2020 1. Left ventricular ejection fraction, by estimation, is 60 to 65%. The  left ventricle has normal function. The left ventricle has no regional  wall motion abnormalities. Left ventricular diastolic parameters are  indeterminate.   2. Right ventricular systolic function is normal. The right ventricular  size is normal. There is mildly elevated pulmonary artery systolic  pressure. The estimated right ventricular systolic pressure is 03.8 mmHg.   3. Left atrial size was mild to moderately dilated.   4. Unable to exclude mild mitral valve prolapse of the posterior leaflet.  Moderate mitral valve regurgitation. No evidence of mitral stenosis.   Patient Profile     60 y.o. female with history of hypertension, paroxysmal atrial fibrillation s/p ablation 07/2020 presenting with nausea, diagnosed with ileus being seen for A. fib RVR.  Assessment & Plan    A. fib RVR -Likely triggered by abdominal illness -Currently in sinus rhythm -Continue Cardizem CD 120 mg daily. -Restart Eliquis 5 mg twice daily when no additional procedures are being planned.   2.  Ileus, chronic constipation -Management as per GI/medicine.  3.  Hypertension -BP controlled.  Continue Cardizem.  Total encounter time 35 minutes  Greater than 50% was spent in counseling and coordination of care with the patient     Signed, Kate Sable, MD  01/16/2021, 11:53 AM

## 2021-01-17 ENCOUNTER — Encounter: Payer: Self-pay | Admitting: Gastroenterology

## 2021-01-17 DIAGNOSIS — K567 Ileus, unspecified: Secondary | ICD-10-CM | POA: Diagnosis not present

## 2021-01-17 DIAGNOSIS — K2900 Acute gastritis without bleeding: Secondary | ICD-10-CM | POA: Diagnosis not present

## 2021-01-17 DIAGNOSIS — K529 Noninfective gastroenteritis and colitis, unspecified: Secondary | ICD-10-CM | POA: Diagnosis not present

## 2021-01-17 DIAGNOSIS — I4891 Unspecified atrial fibrillation: Secondary | ICD-10-CM | POA: Diagnosis not present

## 2021-01-17 MED ORDER — DILTIAZEM HCL ER COATED BEADS 120 MG PO CP24: 120.0000 mg | ORAL_CAPSULE | Freq: Every day | ORAL | 0 refills | Status: DC

## 2021-01-17 MED ORDER — APIXABAN 5 MG PO TABS
5.0000 mg | ORAL_TABLET | Freq: Two times a day (BID) | ORAL | Status: DC
  Administered 2021-01-17: 5 mg via ORAL

## 2021-01-17 NOTE — Progress Notes (Signed)
Patient c/o of back pain, sore throat and a headache. States that she woke twice with gown being wet from sweat. Tmax 99.4 oral. Will continue to monitor.

## 2021-01-17 NOTE — Progress Notes (Signed)
This patient is being discharged. She is alert and oriented. Vitals are normal. Her assessments are complete.  Plan is for her to see Dr. Brita Romp tomorrow. Care management will call her on November 11th. She has follow up appointments with Cardiology the next week on November 15t.  Education given to patient and discharge instructions explained.

## 2021-01-17 NOTE — Discharge Summary (Signed)
[name]    MR#:  [id number]  DATE OF BIRTH:  1960-03-20  DATE OF ADMISSION:  01/13/2021 ADMITTING PHYSICIAN: Fritzi Mandes, MD  DATE OF DISCHARGE: 01/17/2021  PRIMARY CARE PHYSICIAN: Shelby Crews, MD    ADMISSION DIAGNOSIS:  Hypokalemia [E87.6] Enteritis [K52.9] Ileus ([place]) [K56.7] Atrial fibrillation with RVR ([place]) [I48.91] Chest pain, unspecified type [R07.9] Intestinal obstruction, unspecified cause, unspecified whether partial or complete ([place]) [K56.609]  DISCHARGE DIAGNOSIS:  Ileus with epigastric abdominal pain due to gastritis/jejunal enteritis PAF on chronic anticoagulation SECONDARY DIAGNOSIS:   Past Medical History:  Diagnosis Date   Abnormal EKG    HX OF INVERTED T WAVES ON EKG, PALPITATIONS, CHEST PAINS-CARDIAC WORK UP DID NOT SHOW ANY HEART DISEASE   AC (acromioclavicular) joint bone spurs    Acute postoperative pain 01/04/2017   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    Chronic headaches    Chronic, continuous use of opioids    DDD (degenerative disc disease)    CERVICAL AND LUMBAR-CHRONIC PAIN, RT HIP LABRAL TEAR   Depression    PT STATES A LOT OF STRESS IN HER LIFE   Dissociative disorder    Dizziness 04/22/2013   Duodenal ulcer with hemorrhage and perforation ([place]) 04/27/2003   Foot drop, right    FROM BACK SURGERY   GERD (gastroesophageal reflux disease)    H/O arthrodesis 08/18/2014   Headache(784.0)    AND NECK PAIN--STATES RECENT TEST SHOW CERVICAL DEGENERATION   History of blood transfusion    s/p back surgery   History of cardiac cath    a. 05/2019 Cath: Nl cors. EF 55-65%.   History of cardioversion    History of cervical spinal surgery 01/04/2015   History of kidney stones    Hypertension    Inverted T wave    Mitral  regurgitation    a. 07/2020 Echo: EF 60-65%, no rwma. Nl RV size/fxn. RVSP 39.44mmHg. Mildly to mod dil LA. Mod MR; b. 07/2020 TEE: EF 55-60%. Lambl's excresence. Nl RV size/fxn. Midly dil LA. No LA/LAA thrombus. Mild MR.   Narrowing of intervertebral disc space 08/18/2014   Currently on disability.    Orthostatic hypotension 04/22/2013   Pain    CHRONIC NECK AND BACK PAIN - LIMITED ROM NECK - S/P FUSIONS CERVICAL AND LUMBAR   Paroxysmal atrial fibrillation ([place]) 2021   a. 07/2020 s/p PVI.   Pneumonia    PONV (postoperative nausea and vomiting)    PT GIVES HX OF N&V AND FEVER WITH SURGERIES YEARS AGO--BUT NO PROBLEMS WITH MORE RECENT SURGERIES--STATES NOT MALIGNANT HYPERTHERMIA   Postop Hyponatremia 05/14/2012   Postoperative anemia due to acute blood loss 05/14/2012   PTSD (post-traumatic stress disorder)    Right foot drop    Right hip arthralgia 08/18/2014   Status post surgery of right, and now needs left.    Sleep apnea 2021   does not have a cpap   Therapeutic opioid-induced constipation (OIC)    Typical atrial flutter [facility])     HOSPITAL COURSE:   [name] is a 60 y.o. Caucasian female with medical history significant for multiple medical problems including paroxysmal atrial fibrillation status postcardiac ablation in May of this year, asthma, anxiety and depression, who presents emergency room with the  onset of palpitations with associated midsternal chest pain as well as significant nausea and epigastric abdominal pain.   Ileus/low-grade SBO/with nausea and vomiting history of gastritis -- seen by surgery. NG tube removed since patient's nausea gotten worse. Not much NG output. -- Continue IV fluids, clear liquid, antibiotics -- patient did have a BM today passing gas.   Intractable nausea -- G.I. consultation with Dr. Marius Ditch -- IV Protonix BID -- IV fluids and PRN antiemetics -- patient had EGD in 2021-- showed small hiatal hernia, gastritis --11/5 EGD -- Suspected  jejunal inflammation characterized by                         friability. Biopsied.                        - Normal examined duodenum. Biopsied.                        - Mucosal changes suspicious for gastritis. Biopsied.             - A Nissen fundoplication was found.                         Esophagogastric landmarks identified.                        - Normal gastroesophageal junction and esophagus --colonoscopy normal --ok to resume eliquis per Dr Marius Ditch   atrial fibrillation with RVR acute on chronic chronic anticoagulation -- patient was on IV diltiazem drip now discontinued. Heart rate in the 70s. -- Seen by Dr. Rockey Situ from [facility] MG recommends continue present rate blocking agents -- patient has had ablation in May 2022 -- will resume eliquis at discharge per GI rec   Anxiety -- continue BuSpar and Xanax   chronic pain syndrome -- on morphine given through the pain clinic. Patient follows with Dr. Dossie Arbour   asthma without exacerbation -- continue Singulair   Low grade fever likely due to flu vaccine   Procedures: EGD, colonoscopy Family communication : none today Consults : G.I., surgery CODE STATUS: full DVT Prophylaxis : eliquis Level of care: Progressive Cardiac Status is: Inpatient  d/c home today. Pt agreeable monitor one more day for atrial fibrillation/heart         CONSULTS OBTAINED:  Treatment Team:  Minna Merritts, MD  DRUG ALLERGIES:   Allergies  Allergen Reactions   Amoxicillin Hives    She did ok w ANCEF   Chlorhexidine Gluconate Dermatitis and Hives   Clindamycin Hives   Codeine Hives   Erythromycin Hives    "mycins" in general   Penicillin G Hives    "cillins" in general   Sulfa Antibiotics Nausea And Vomiting and Hives        Levofloxacin Hives   Shellfish Allergy Hives   Decadron [Dexamethasone] Other (See Comments)    Hot flashes, insomnia, "manic" Hot flashes, insomnia, "manic"   Mangifera Indica Hives    papaya   Papaya  Derivatives Hives   Betadine [Povidone Iodine] Hives        Clarithromycin Hives   Other Hives     Mango    Povidone-Iodine Hives   Prednisone Anxiety    High blood pressure, flushed, mood changes, heart palpitations High blood pressure, flushed, mood changes, heart palpitations    DISCHARGE MEDICATIONS:   Allergies as  of 01/17/2021       Reactions   Amoxicillin Hives   She did ok w ANCEF   Chlorhexidine Gluconate Dermatitis, Hives   Clindamycin Hives   Codeine Hives   Erythromycin Hives   "mycins" in general   Penicillin G Hives   "cillins" in general   Sulfa Antibiotics Nausea And Vomiting, Hives       Levofloxacin Hives   Shellfish Allergy Hives   Decadron [dexamethasone] Other (See Comments)   Hot flashes, insomnia, "manic" Hot flashes, insomnia, "manic"   Mangifera Indica Hives   papaya   Papaya Derivatives Hives   Betadine [povidone Iodine] Hives       Clarithromycin Hives   Other Hives    Mango   Povidone-iodine Hives   Prednisone Anxiety   High blood pressure, flushed, mood changes, heart palpitations High blood pressure, flushed, mood changes, heart palpitations        Medication List     STOP taking these medications    celecoxib 200 MG capsule Commonly known as: CELEBREX   dicyclomine 20 MG tablet Commonly known as: BENTYL   valACYclovir 1000 MG tablet Commonly known as: VALTREX       TAKE these medications    acetaminophen 500 MG tablet Commonly known as: TYLENOL Take 500-1,000 mg by mouth every 6 (six) hours as needed for moderate pain.   ALPRAZolam 1 MG tablet Commonly known as: XANAX Take 1 mg by mouth at bedtime.   apixaban 5 MG Tabs tablet Commonly known as: ELIQUIS Take 1 tablet (5 mg total) by mouth 2 (two) times daily.   BENGAY EX Apply 1 application topically daily as needed (pain).   busPIRone 7.5 MG tablet Commonly known as: BUSPAR Take 7.5 mg by mouth 2 (two) times daily.   diltiazem 120 MG 24 hr  capsule Commonly known as: CARDIZEM CD Take 1 capsule (120 mg total) by mouth daily. Start taking on: January 18, 2021   Linzess 290 MCG Caps capsule Generic drug: linaclotide TAKE 1 CAPSULE BY MOUTH DAILY BEFORE BREAKFAST.   montelukast 10 MG tablet Commonly known as: SINGULAIR TAKE 1 TABLET BY MOUTH EVERY DAY   morphine 15 MG tablet Commonly known as: MSIR Take 1 tablet (15 mg total) by mouth every 6 (six) hours as needed for moderate pain or severe pain. Must last 30 days. What changed: Another medication with the same name was removed. Continue taking this medication, and follow the directions you see here.   morphine 15 MG tablet Commonly known as: MSIR Take 1 tablet (15 mg total) by mouth every 6 (six) hours as needed for moderate pain or severe pain. Must last 30 days. Start taking on: February 15, 2021 What changed: Another medication with the same name was removed. Continue taking this medication, and follow the directions you see here.   pantoprazole 40 MG tablet Commonly known as: PROTONIX TAKE 1 TABLET BY MOUTH TWICE A DAY   polyethylene glycol 17 g packet Commonly known as: MIRALAX / GLYCOLAX Take 17 g by mouth daily.   sodium chloride 0.65 % Soln nasal spray Commonly known as: OCEAN Place 1 spray into both nostrils daily as needed for congestion (Dryness).   Visine 0.025-0.3 % ophthalmic solution Generic drug: naphazoline-pheniramine 1 drop 4 (four) times daily as needed for eye irritation.        If you experience worsening of your admission symptoms, develop shortness of breath, life threatening emergency, suicidal or homicidal thoughts you must seek medical attention immediately  by calling 911 or calling your MD immediately  if symptoms less severe.  You Must read complete instructions/literature along with all the possible adverse reactions/side effects for all the Medicines you take and that have been prescribed to you. Take any new Medicines after you  have completely understood and accept all the possible adverse reactions/side effects.   Please note  You were cared for by a hospitalist during your hospital stay. If you have any questions about your discharge medications or the care you received while you were in the hospital after you are discharged, you can call the unit and asked to speak with the hospitalist on call if the hospitalist that took care of you is not available. Once you are discharged, your primary care physician will handle any further medical issues. Please note that NO REFILLS for any discharge medications will be authorized once you are discharged, as it is imperative that you return to your primary care physician (or establish a relationship with a primary care physician if you do not have one) for your aftercare needs so that they can reassess your need for medications and monitor your lab values. Today   SUBJECTIVE   Feels ok. HR stable tolerating po diet  VITAL SIGNS:  Blood pressure 110/74, pulse 65, temperature (!) 97.5 F (36.4 C), resp. rate 14, height 5\' 4"  (1.626 m), weight 65.3 kg, SpO2 99 %.  I/O:   Intake/Output Summary (Last 24 hours) at 01/17/2021 0849 Last data filed at 01/16/2021 2000 Gross per 24 hour  Intake 720 ml  Output --  Net 720 ml    PHYSICAL EXAMINATION:  GENERAL:  60 y.o.-year-old patient lying in the bed with no acute distress.  LUNGS: Normal breath sounds bilaterally, no wheezing, rales,rhonchi or crepitation. No use of accessory muscles of respiration.  CARDIOVASCULAR: S1, S2 normal. No murmurs, rubs, or gallops.  ABDOMEN: Soft, non-tender, non-distended. Bowel sounds present. No organomegaly or mass.  EXTREMITIES: No pedal edema, cyanosis, or clubbing.  NEUROLOGIC: Cranial nerves II through XII are intact. Muscle strength 5/5 in all extremities. Sensation intact. Gait not checked.  PSYCHIATRIC: The patient is alert and oriented x 3.  SKIN: No obvious rash, lesion, or ulcer.    DATA REVIEW:   CBC  Recent Labs  Lab 01/14/21 0615  WBC 7.4  HGB 11.1*  HCT 33.8*  PLT 313    Chemistries  Recent Labs  Lab 01/13/21 2014 01/14/21 0150 01/14/21 0615  NA 138  --  136  K 2.9*  --  4.3  CL 102  --  104  CO2 26  --  24  GLUCOSE 144*  --  146*  BUN 12  --  12  CREATININE 1.07*  --  0.85  CALCIUM 9.1  --  8.9  MG  --    < > 2.2  AST 21  --   --   ALT 11  --   --   ALKPHOS 108  --   --   BILITOT 0.8  --   --    < > = values in this interval not displayed.    Microbiology Results   Recent Results (from the past 240 hour(s))  Resp Panel by RT-PCR (Flu A&B, Covid) Nasopharyngeal Swab     Status: None   Collection Time: 01/13/21 10:53 PM   Specimen: Nasopharyngeal Swab; Nasopharyngeal(NP) swabs in vial transport medium  Result Value Ref Range Status   SARS Coronavirus 2 by RT PCR NEGATIVE NEGATIVE Final  Comment: (NOTE) SARS-CoV-2 target nucleic acids are NOT DETECTED.  The SARS-CoV-2 RNA is generally detectable in upper respiratory specimens during the acute phase of infection. The lowest concentration of SARS-CoV-2 viral copies this assay can detect is 138 copies/mL. A negative result does not preclude SARS-Cov-2 infection and should not be used as the sole basis for treatment or other patient management decisions. A negative result may occur with  improper specimen collection/handling, submission of specimen other than nasopharyngeal swab, presence of viral mutation(s) within the areas targeted by this assay, and inadequate number of viral copies(<138 copies/mL). A negative result must be combined with clinical observations, patient history, and epidemiological information. The expected result is Negative.  Fact Sheet for Patients:  EntrepreneurPulse.com.au  Fact Sheet for Healthcare Providers:  IncredibleEmployment.be  This test is no t yet approved or cleared by the [place] FDA and  has been  authorized for detection and/or diagnosis of SARS-CoV-2 by FDA under an Emergency Use Authorization (EUA). This EUA will remain  in effect (meaning this test can be used) for the duration of the COVID-19 declaration under Section 564(b)(1) of the Act, 21 U.S.C.section 360bbb-3(b)(1), unless the authorization is terminated  or revoked sooner.       Influenza A by PCR NEGATIVE NEGATIVE Final   Influenza B by PCR NEGATIVE NEGATIVE Final    Comment: (NOTE) The Xpert Xpress SARS-CoV-2/FLU/RSV plus assay is intended as an aid in the diagnosis of influenza from Nasopharyngeal swab specimens and should not be used as a sole basis for treatment. Nasal washings and aspirates are unacceptable for Xpert Xpress SARS-CoV-2/FLU/RSV testing.  Fact Sheet for Patients: EntrepreneurPulse.com.au  Fact Sheet for Healthcare Providers: IncredibleEmployment.be  This test is not yet approved or cleared by the [place] FDA and has been authorized for detection and/or diagnosis of SARS-CoV-2 by FDA under an Emergency Use Authorization (EUA). This EUA will remain in effect (meaning this test can be used) for the duration of the COVID-19 declaration under Section 564(b)(1) of the Act, 21 U.S.C. section 360bbb-3(b)(1), unless the authorization is terminated or revoked.  Performed at [facility], [address], [place], [place] [address]     RADIOLOGY:  No results found.   CODE STATUS:     Code Status Orders  (From admission, onward)           Start     Ordered   01/14/21 0443  Full code  Continuous        01/14/21 0448           Code Status History     Date Active Date Inactive Code Status Order ID Comments User Context   08/06/2020 1155 08/06/2020 2051 Full Code [id number]  Vickie Epley, MD Inpatient   02/26/2020 1450 02/28/2020 1831 Full Code [id number]  Jules Husbands, MD Inpatient   06/10/2019 1428 06/10/2019 2128 Full Code  [id number]  Nelva Bush, MD Inpatient   04/16/2013 2300 04/24/2013 2037 Full Code [id number]  Melina Schools, MD Inpatient   05/13/2012 1759 05/18/2012 1545 Full Code [id number]  Aluisio, Dione Plover, MD Inpatient        TOTAL TIME TAKING CARE OF THIS PATIENT: 35 minutes.    Fritzi Mandes M.D  [facility]    CC: Primary care physician; Shelby Crews, MD

## 2021-01-18 ENCOUNTER — Encounter: Payer: Self-pay | Admitting: Family Medicine

## 2021-01-18 ENCOUNTER — Ambulatory Visit (INDEPENDENT_AMBULATORY_CARE_PROVIDER_SITE_OTHER): Payer: Medicare Other | Admitting: Family Medicine

## 2021-01-18 ENCOUNTER — Other Ambulatory Visit: Payer: Self-pay

## 2021-01-18 VITALS — BP 102/80 | HR 52 | Temp 97.0°F | Resp 16 | Ht 64.0 in | Wt 148.2 lb

## 2021-01-18 DIAGNOSIS — E7800 Pure hypercholesterolemia, unspecified: Secondary | ICD-10-CM

## 2021-01-18 DIAGNOSIS — D500 Iron deficiency anemia secondary to blood loss (chronic): Secondary | ICD-10-CM | POA: Diagnosis not present

## 2021-01-18 DIAGNOSIS — L989 Disorder of the skin and subcutaneous tissue, unspecified: Secondary | ICD-10-CM | POA: Diagnosis not present

## 2021-01-18 DIAGNOSIS — E663 Overweight: Secondary | ICD-10-CM

## 2021-01-18 DIAGNOSIS — G4733 Obstructive sleep apnea (adult) (pediatric): Secondary | ICD-10-CM | POA: Diagnosis not present

## 2021-01-18 DIAGNOSIS — K567 Ileus, unspecified: Secondary | ICD-10-CM

## 2021-01-18 DIAGNOSIS — I4891 Unspecified atrial fibrillation: Secondary | ICD-10-CM | POA: Diagnosis not present

## 2021-01-18 DIAGNOSIS — Z0000 Encounter for general adult medical examination without abnormal findings: Secondary | ICD-10-CM

## 2021-01-18 DIAGNOSIS — I10 Essential (primary) hypertension: Secondary | ICD-10-CM

## 2021-01-18 DIAGNOSIS — K56609 Unspecified intestinal obstruction, unspecified as to partial versus complete obstruction: Secondary | ICD-10-CM

## 2021-01-18 DIAGNOSIS — R739 Hyperglycemia, unspecified: Secondary | ICD-10-CM | POA: Diagnosis not present

## 2021-01-18 DIAGNOSIS — Z1231 Encounter for screening mammogram for malignant neoplasm of breast: Secondary | ICD-10-CM

## 2021-01-18 LAB — SURGICAL PATHOLOGY

## 2021-01-18 NOTE — Assessment & Plan Note (Signed)
Recent hospitalization Having regular BMs now Upcoming f/u with GI

## 2021-01-18 NOTE — Assessment & Plan Note (Signed)
Discussed importance of healthy weight management Discussed diet and exercise  

## 2021-01-18 NOTE — Assessment & Plan Note (Signed)
Chronic and previously well controlled Upcoming refitting for mask Did not use CPAP in the hospital, but using at home

## 2021-01-18 NOTE — Assessment & Plan Note (Signed)
Small papules, may be skin tags, but do get inflamed and bleed intermittently Referral to [facility]

## 2021-01-18 NOTE — Assessment & Plan Note (Signed)
Reviewed last lipid panel Not currently on a statin Recheck FLP and CMP Discussed diet and exercise  

## 2021-01-18 NOTE — Assessment & Plan Note (Signed)
Well controlled Also followed by Cardiology  Continue current medications Recheck metabolic panel

## 2021-01-18 NOTE — Assessment & Plan Note (Addendum)
Recent hospitalization Now on diltiazem - going ok, occasional lightheadedness Upcoming appt with Cardiology Has resumed Eliquis

## 2021-01-18 NOTE — Progress Notes (Signed)
Annual Wellness Visit     Patient: [name], Female    DOB: 27-Dec-1960, 60 y.o.   MRN: [id number] Visit Date: 01/18/2021  Today's Provider: Lavon Paganini, MD   Chief Complaint  Patient presents with   Annual Exam   Subjective    [name] is a 60 y.o. female who presents today for her Annual Wellness Visit. She reports consuming a  heart healthy  diet. Walking 30 minutes a day. She generally feels fairly well. She reports sleeping poorly. She does not have additional problems to discuss today.   HPI 04/22/20 Mammogram-BI-RADS 1 01/16/21 Colonoscopy-WNL  Medications: Outpatient Medications Prior to Visit  Medication Sig   acetaminophen (TYLENOL) 500 MG tablet Take 500-1,000 mg by mouth every 6 (six) hours as needed for moderate pain.    ALPRAZolam (XANAX) 1 MG tablet Take 1 mg by mouth at bedtime.    apixaban (ELIQUIS) 5 MG TABS tablet Take 1 tablet (5 mg total) by mouth 2 (two) times daily.   busPIRone (BUSPAR) 7.5 MG tablet Take 7.5 mg by mouth 2 (two) times daily.   diltiazem (CARDIZEM CD) 120 MG 24 hr capsule Take 1 capsule (120 mg total) by mouth daily.   LINZESS 290 MCG CAPS capsule TAKE 1 CAPSULE BY MOUTH DAILY BEFORE BREAKFAST.   Menthol, Topical Analgesic, (BENGAY EX) Apply 1 application topically daily as needed (pain).   montelukast (SINGULAIR) 10 MG tablet TAKE 1 TABLET BY MOUTH EVERY DAY   morphine (MSIR) 15 MG tablet Take 1 tablet (15 mg total) by mouth every 6 (six) hours as needed for moderate pain or severe pain. Must last 30 days.   [START ON 02/15/2021] morphine (MSIR) 15 MG tablet Take 1 tablet (15 mg total) by mouth every 6 (six) hours as needed for moderate pain or severe pain. Must last 30 days.   naphazoline-pheniramine (VISINE) 0.025-0.3 % ophthalmic solution 1 drop 4 (four) times daily as needed for eye irritation.   pantoprazole (PROTONIX) 40 MG tablet TAKE 1 TABLET BY MOUTH TWICE A DAY   polyethylene glycol (MIRALAX / GLYCOLAX)  17 g packet Take 17 g by mouth daily.   sodium chloride (OCEAN) 0.65 % SOLN nasal spray Place 1 spray into both nostrils daily as needed for congestion (Dryness).   No facility-administered medications prior to visit.    Allergies  Allergen Reactions   Amoxicillin Hives    She did ok w ANCEF   Chlorhexidine Gluconate Dermatitis and Hives   Clindamycin Hives   Codeine Hives   Erythromycin Hives    "mycins" in general   Penicillin G Hives    "cillins" in general   Sulfa Antibiotics Nausea And Vomiting and Hives        Levofloxacin Hives   Shellfish Allergy Hives   Decadron [Dexamethasone] Other (See Comments)    Hot flashes, insomnia, "manic" Hot flashes, insomnia, "manic"   Mangifera Indica Hives    papaya   Papaya Derivatives Hives   Betadine [Povidone Iodine] Hives        Clarithromycin Hives   Other Hives     Mango    Povidone-Iodine Hives   Prednisone Anxiety    High blood pressure, flushed, mood changes, heart palpitations High blood pressure, flushed, mood changes, heart palpitations    Patient Care Team: Virginia Crews, MD as PCP - General (Family Medicine) Kate Sable, MD as PCP - Cardiology (Cardiology) Vickie Epley, MD as PCP - Electrophysiology (Cardiology) Sharol Given, NP  as Nurse Practitioner (Psychiatry) Lucilla Lame, MD as Consulting Physician (Gastroenterology) Jules Husbands, MD as Consulting Physician (General Surgery) Milinda Pointer, MD as Referring Physician (Pain Medicine) Pcp, No Vivianne Master, Freddi Che, DMD (Dentistry) Neldon Labella, RN as Case Manager  Review of Systems  Constitutional:  Positive for activity change, appetite change, fatigue, fever and unexpected weight change.  HENT:  Positive for sore throat and tinnitus.   Eyes: Negative.   Respiratory:  Positive for cough and shortness of breath.   Cardiovascular: Negative.   Gastrointestinal:  Positive for abdominal distention and abdominal pain.   Endocrine: Negative.   Genitourinary: Negative.   Musculoskeletal:  Positive for arthralgias, back pain, gait problem, myalgias, neck pain and neck stiffness.  Skin:        Moles on right shoulder area and on left forearm  Allergic/Immunologic: Positive for environmental allergies and food allergies.  Neurological:  Positive for headaches.  Hematological: Negative.   Psychiatric/Behavioral:  The patient is nervous/anxious.    Last CBC Lab Results  Component Value Date   WBC 7.4 01/14/2021   HGB 11.1 (L) 01/14/2021   HCT 33.8 (L) 01/14/2021   MCV 81.6 01/14/2021   MCH 26.8 01/14/2021   RDW 13.8 01/14/2021   PLT 313 70/03/7492   Last metabolic panel Lab Results  Component Value Date   GLUCOSE 146 (H) 01/14/2021   NA 136 01/14/2021   K 4.3 01/14/2021   CL 104 01/14/2021   CO2 24 01/14/2021   BUN 12 01/14/2021   CREATININE 0.85 01/14/2021   GFRNONAA >60 01/14/2021   CALCIUM 8.9 01/14/2021   PROT 7.9 01/13/2021   ALBUMIN 4.3 01/13/2021   LABGLOB 2.7 11/11/2018   AGRATIO 1.5 11/11/2018   BILITOT 0.8 01/13/2021   ALKPHOS 108 01/13/2021   AST 21 01/13/2021   ALT 11 01/13/2021   ANIONGAP 8 01/14/2021   Last lipids Lab Results  Component Value Date   CHOL 183 01/13/2020   HDL 64 01/13/2020   LDLCALC 99 01/13/2020   TRIG 116 01/13/2020   CHOLHDL 2.9 01/13/2020   Last hemoglobin A1c Lab Results  Component Value Date   HGBA1C 5.1 01/27/2016   Last thyroid functions Lab Results  Component Value Date   TSH 1.424 07/10/2019   Last vitamin D Lab Results  Component Value Date   25OHVITD2 9.8 11/11/2018   25OHVITD3 13 11/11/2018   Last vitamin B12 and Folate Lab Results  Component Value Date   VITAMINB12 241 01/14/2021   FOLATE 14.9 01/14/2021        Objective    Vitals: BP 102/80 (BP Location: Right Arm, Patient Position: Sitting, Cuff Size: Large)   Pulse (!) 52   Temp (!) 97 F (36.1 C) (Temporal)   Resp 16   Ht 5\' 4"  (1.626 m)   Wt 148 lb 3.2 oz  (67.2 kg)   SpO2 97%   BMI 25.44 kg/m  BP Readings from Last 3 Encounters:  01/18/21 102/80  01/17/21 (!) 110/54  11/30/20 (!) 140/99   Wt Readings from Last 3 Encounters: Wt Readings from Last 3 Encounters:  01/18/21 148 lb 3.2 oz (67.2 kg)  01/15/21 144 lb (65.3 kg)  11/30/20 140 lb (63.5 kg)      Physical Exam Vitals reviewed.  Constitutional:      General: She is not in acute distress.    Appearance: Normal appearance. She is well-developed. She is not diaphoretic.  HENT:     Head: Normocephalic and atraumatic.     Right  Ear: Tympanic membrane, ear canal and external ear normal.     Left Ear: Tympanic membrane, ear canal and external ear normal.     Nose: Nose normal.     Mouth/Throat:     Mouth: Mucous membranes are moist.     Pharynx: Oropharynx is clear. No oropharyngeal exudate.  Eyes:     General: No scleral icterus.    Conjunctiva/sclera: Conjunctivae normal.     Pupils: Pupils are equal, round, and reactive to light.  Neck:     Thyroid: No thyromegaly.  Cardiovascular:     Rate and Rhythm: Normal rate and regular rhythm.     Pulses: Normal pulses.     Heart sounds: Normal heart sounds. No murmur heard. Pulmonary:     Effort: Pulmonary effort is normal. No respiratory distress.     Breath sounds: Normal breath sounds. No wheezing or rales.  Abdominal:     General: There is no distension.     Palpations: Abdomen is soft.     Tenderness: There is no abdominal tenderness.  Musculoskeletal:        General: No deformity.     Cervical back: Neck supple.     Right lower leg: No edema.     Left lower leg: No edema.  Lymphadenopathy:     Cervical: No cervical adenopathy.  Skin:    General: Skin is warm and dry.     Findings: No rash.  Neurological:     Mental Status: She is alert and oriented to person, place, and time. Mental status is at baseline.     Sensory: No sensory deficit.     Motor: No weakness.     Gait: Gait normal.  Psychiatric:        Mood  and Affect: Mood normal.        Behavior: Behavior normal.        Thought Content: Thought content normal.     Most recent functional status assessment:  In your present state of health, do you have any difficulty performing the following activities: 01/18/2021  Hearing? N  Vision? N  Difficulty concentrating or making decisions? N  Walking or climbing stairs? Y  Dressing or bathing? N  Doing errands, shopping? N  Some recent data might be hidden   Most recent fall risk assessment: Fall Risk  01/18/2021  Falls in the past year? -  Comment -  Number falls in past yr: 1  Comment -  Injury with Fall? 0  Comment -  Risk Factor Category  -  Comment -  Risk for fall due to : History of fall(s)  Risk for fall due to: Comment -  Follow up Falls evaluation completed;Education provided  Comment -    Most recent depression screenings: PHQ 2/9 Scores 01/18/2021 11/12/2020  PHQ - 2 Score 2 0  PHQ- 9 Score 8 -  Exception Documentation - -  Not completed - -   Most recent cognitive screening: 6CIT Screen 01/18/2021  What Year? 0 points  What month? 0 points  What time? 0 points  Count back from 20 0 points  Months in reverse 0 points  Repeat phrase 6 points  Total Score 6   Most recent Audit-C alcohol use screening Alcohol Use Disorder Test (AUDIT) 01/18/2021  1. How often do you have a drink containing alcohol? 0  2. How many drinks containing alcohol do you have on a typical day when you are drinking? 0  3. How often do you have  six or more drinks on one occasion? 0  AUDIT-C Score 0   A score of 3 or more in women, and 4 or more in men indicates increased risk for alcohol abuse, EXCEPT if all of the points are from question 1   No results found for any visits on 01/18/21.  Assessment & Plan     Annual wellness visit done today including the all of the following: Reviewed patient's Family Medical History Reviewed and updated list of patient's medical providers Assessment  of cognitive impairment was done Assessed patient's functional ability Established a written schedule for health screening [place] Completed and Reviewed  Exercise Activities and Dietary recommendations  Goals       "I am dealing with so much stress with my son and grandchildren" (pt-stated)      Current Barriers:  Chronic Mental Health needs related to anxiety and depression Mental Health Concerns  Family and relationship dysfunction Lacks knowledge of community resource: community resources to assist with financial difficulties Suicidal Ideation/Homicidal Ideation: No  Clinical Social Work Goal(s):  Over the next  90 days, patient will work with SW bi-weekly by telephone or in person to reduce or manage symptoms of anxiety and depression related to her parenting responsibilities of her 3 grandchildren and dysfunctional relationship with her son   Interventions:  Continued to explore relationship with her son explored, which remains conflictual Explored progress of managing home schooling with her 3 grandchildren, with patient acknowledging no new improvements . Per patient, experience remains the same Followed up on information provided  on [place] [phone] www. [employer]org and Grandparents Raising Grandchildren meets every 3rd Wednesday at 11 am at the [facility] [phone] Patient confirmed that she has not contacted the resources provided Patient encouraged to contact resources provided for added support Patient encouraged to utilized resources provided and to call this social worker with any questions or concerns regarding her mental health or community resource needs.   Patient Self Care Activities:  Performs ADL's independently Performs IADL's independently Ability for insight  Patient Coping Strengths:  Family Able to Communicate Effectively  Patient Self Care Deficits:  Difficulties in establishing and maintaining boundaries with her  adult son  Please see past updates related to this goal by clicking on the "Past Updates" button in the selected goal        Increase water intake      Recommend increasing fluid intake to 4-6 glasses each day      LIFESTYLE - DECREASE FALLS RISK      Recommend to remove any items from the home that may cause slips or trips.        Immunization History  Administered Date(s) Administered   Influenza,inj,Quad PF,6+ Mos 12/30/2015, 12/07/2016, 12/25/2017, 12/27/2018, 01/13/2020, 01/16/2021   PFIZER(Purple Top)SARS-COV-2 Vaccination 06/06/2019, 07/04/2019, 12/22/2019   Pneumococcal Polysaccharide-23 12/30/2015    Health Maintenance  Topic Date Due   TETANUS/TDAP  Never done   Zoster Vaccines- Shingrix (1 of 2) Never done   Pneumococcal Vaccine 47-87 Years old (2 - PCV) 12/29/2016   COVID-19 Vaccine (4 - Booster for Pfizer series) 02/16/2020   MAMMOGRAM  04/22/2022   COLONOSCOPY (Pts 45-87yrs Insurance coverage will need to be confirmed)  01/16/2026   INFLUENZA VACCINE  Completed   Hepatitis C Screening  Completed   HIV Screening  Completed   HPV VACCINES  Aged Out     Discussed health benefits of physical activity, and encouraged her to engage in regular exercise appropriate for  her age and condition.    Problem List Items Addressed This Visit       Cardiovascular and Mediastinum   Hypertension    Well controlled Also followed by Cardiology  Continue current medications Recheck metabolic panel      Atrial fibrillation with RVR ([place])    Recent hospitalization Now on diltiazem - going ok, occasional lightheadedness Upcoming appt with Cardiology Has resumed Eliquis        Respiratory   OSA (obstructive sleep apnea)    Chronic and previously well controlled Upcoming refitting for mask Did not use CPAP in the hospital, but using at home        Digestive   Ileus [facility])    Recent hospitalization Having regular BMs now Upcoming f/u with GI         Musculoskeletal and Integument   Skin abnormalities    Small papules, may be skin tags, but do get inflamed and bleed intermittently Referral to Derm      Relevant Orders   Ambulatory referral to Dermatology     Other   Overweight    Discussed importance of healthy weight management Discussed diet and exercise       Pure hypercholesterolemia    Reviewed last lipid panel Not currently on a statin Recheck FLP and CMP Discussed diet and exercise       Relevant Orders   Lipid Panel With LDL/HDL Ratio   Comprehensive metabolic panel   Other Visit Diagnoses     Encounter for Medicare annual wellness exam    -  Primary   Encounter for annual physical exam       Iron deficiency anemia due to chronic blood loss       Relevant Orders   Ambulatory referral to Hematology / Oncology   Essential hypertension       Relevant Orders   Comprehensive metabolic panel   SBO (small bowel obstruction) (HCC)       Hyperglycemia       Relevant Orders   Hemoglobin A1c   Screening mammogram for breast cancer       Relevant Orders   MM 3D SCREEN BREAST BILATERAL        Return in about 1 year (around 01/18/2022) for AWV, CPE.     I, Lavon Paganini, MD, have reviewed all documentation for this visit. The documentation on 01/18/21 for the exam, diagnosis, procedures, and orders are all accurate and complete.   Keelin Neville, Dionne Bucy, MD, MPH [facility]

## 2021-01-19 DIAGNOSIS — E7800 Pure hypercholesterolemia, unspecified: Secondary | ICD-10-CM | POA: Diagnosis not present

## 2021-01-19 DIAGNOSIS — I10 Essential (primary) hypertension: Secondary | ICD-10-CM | POA: Diagnosis not present

## 2021-01-19 DIAGNOSIS — R739 Hyperglycemia, unspecified: Secondary | ICD-10-CM | POA: Diagnosis not present

## 2021-01-19 LAB — SURGICAL PATHOLOGY

## 2021-01-19 LAB — PANCREATIC ELASTASE, FECAL: Pancreatic Elastase-1, Stool: 107 ug Elast./g — ABNORMAL LOW (ref >200)

## 2021-01-19 LAB — CELIAC DISEASE PANEL
Endomysial Ab, IgA: NEGATIVE
IgA: 131 mg/dL (ref 87–352)
Tissue Transglutaminase Ab, IgA: <2 U/mL (ref 0–3)

## 2021-01-20 ENCOUNTER — Telehealth: Payer: Self-pay

## 2021-01-20 LAB — COMPREHENSIVE METABOLIC PANEL
ALT: 16 IU/L (ref 0–32)
AST: 18 IU/L (ref 0–40)
Albumin/Globulin Ratio: 1.7 (ref 1.2–2.2)
Albumin: 3.9 g/dL (ref 3.8–4.8)
Alkaline Phosphatase: 135 IU/L — ABNORMAL HIGH (ref 44–121)
BUN/Creatinine Ratio: 9 — ABNORMAL LOW (ref 12–28)
BUN: 7 mg/dL — ABNORMAL LOW (ref 8–27)
Bilirubin Total: 0.3 mg/dL (ref 0.0–1.2)
CO2: 22 mmol/L (ref 20–29)
Calcium: 8.9 mg/dL (ref 8.7–10.3)
Chloride: 105 mmol/L (ref 96–106)
Creatinine, Ser: 0.75 mg/dL (ref 0.57–1.00)
Globulin, Total: 2.3 g/dL (ref 1.5–4.5)
Glucose: 93 mg/dL (ref 70–99)
Potassium: 4.2 mmol/L (ref 3.5–5.2)
Sodium: 140 mmol/L (ref 134–144)
Total Protein: 6.2 g/dL (ref 6.0–8.5)
eGFR: 91 mL/min/{1.73_m2} (ref >59)

## 2021-01-20 LAB — LIPID PANEL WITH LDL/HDL RATIO
Cholesterol, Total: 138 mg/dL (ref 100–199)
HDL: 49 mg/dL (ref >39)
LDL Chol Calc (NIH): 62 mg/dL (ref 0–99)
LDL/HDL Ratio: 1.3 ratio (ref 0.0–3.2)
Triglycerides: 162 mg/dL — ABNORMAL HIGH (ref 0–149)
VLDL Cholesterol Cal: 27 mg/dL (ref 5–40)

## 2021-01-20 LAB — HEMOGLOBIN A1C
Est. average glucose Bld gHb Est-mCnc: 111 mg/dL
Hgb A1c MFr Bld: 5.5 % (ref 4.8–5.6)

## 2021-01-20 NOTE — Telephone Encounter (Signed)
Has appointment on January 4 will come by to get samples and understands her results

## 2021-01-20 NOTE — Telephone Encounter (Signed)
-----   Message from Lin Landsman, MD sent at 01/19/2021  5:48 PM EST ----- Please call patient and inform that her stool study test that was performed in the hospital confirms that she has pancreatic insufficiency.  Recommend Creon or Zenpep 1 to 2 capsules with first bite of each meal and 1 with snack, she can pick up samples first.  The pathology results from upper endoscopy and colonoscopy came back normal  I would like to see her in the office in next 1 to 2 months, okay to [facility] Dx: Hospital follow-up, pancreatic insufficiency  Thanks RV

## 2021-01-21 ENCOUNTER — Ambulatory Visit (INDEPENDENT_AMBULATORY_CARE_PROVIDER_SITE_OTHER): Payer: Medicare Other

## 2021-01-21 DIAGNOSIS — G4733 Obstructive sleep apnea (adult) (pediatric): Secondary | ICD-10-CM | POA: Diagnosis not present

## 2021-01-21 DIAGNOSIS — K2100 Gastro-esophageal reflux disease with esophagitis, without bleeding: Secondary | ICD-10-CM

## 2021-01-21 DIAGNOSIS — J45909 Unspecified asthma, uncomplicated: Secondary | ICD-10-CM

## 2021-01-21 DIAGNOSIS — I10 Essential (primary) hypertension: Secondary | ICD-10-CM

## 2021-01-21 NOTE — Chronic Care Management (AMB) (Signed)
Chronic Care Management   CCM RN Visit Note  01/21/2021 Name: [name] MRN: [id number] DOB: 03-Feb-1961  Subjective: [name] is a 60 y.o. year old female who is a primary care patient of Bacigalupo, Dionne Bucy, MD. The care management team was consulted for assistance with disease management and care coordination needs.    Engaged with patient by telephone for follow up visit in response to provider referral for case management and  care coordination services.   Consent to Services:  The patient was given information about Chronic Care Management services, agreed to services, and gave verbal consent prior to initiation of services.  Please see initial visit note for detailed documentation.    Assessment: Review of patient past medical history, allergies, medications, health status, including review of consultants reports, laboratory and other test data, was performed as part of comprehensive evaluation and provision of chronic care management services.   SDOH (Social Determinants of Health) assessments and interventions performed: No   CCM Care Plan  Allergies  Allergen Reactions   Amoxicillin Hives    She did ok w ANCEF   Chlorhexidine Gluconate Dermatitis and Hives   Clindamycin Hives   Codeine Hives   Erythromycin Hives    "mycins" in general   Penicillin G Hives    "cillins" in general   Sulfa Antibiotics Nausea And Vomiting and Hives        Levofloxacin Hives   Shellfish Allergy Hives   Decadron [Dexamethasone] Other (See Comments)    Hot flashes, insomnia, "manic" Hot flashes, insomnia, "manic"   Mangifera Indica Hives    papaya   Papaya Derivatives Hives   Betadine [Povidone Iodine] Hives        Clarithromycin Hives   Other Hives     Mango    Povidone-Iodine Hives   Prednisone Anxiety    High blood pressure, flushed, mood changes, heart palpitations High blood pressure, flushed, mood changes, heart palpitations    Outpatient Encounter  Medications as of 01/21/2021  Medication Sig Note   LINZESS 290 MCG CAPS capsule TAKE 1 CAPSULE BY MOUTH DAILY BEFORE BREAKFAST.    pantoprazole (PROTONIX) 40 MG tablet TAKE 1 TABLET BY MOUTH TWICE A DAY    acetaminophen (TYLENOL) 500 MG tablet Take 500-1,000 mg by mouth every 6 (six) hours as needed for moderate pain.     ALPRAZolam (XANAX) 1 MG tablet Take 1 mg by mouth at bedtime.     apixaban (ELIQUIS) 5 MG TABS tablet Take 1 tablet (5 mg total) by mouth 2 (two) times daily.    busPIRone (BUSPAR) 7.5 MG tablet Take 7.5 mg by mouth 2 (two) times daily.    diltiazem (CARDIZEM CD) 120 MG 24 hr capsule Take 1 capsule (120 mg total) by mouth daily.    Menthol, Topical Analgesic, (BENGAY EX) Apply 1 application topically daily as needed (pain).    montelukast (SINGULAIR) 10 MG tablet TAKE 1 TABLET BY MOUTH EVERY DAY    morphine (MSIR) 15 MG tablet Take 1 tablet (15 mg total) by mouth every 6 (six) hours as needed for moderate pain or severe pain. Must last 30 days. 01/13/2021: Future RX   [START ON 02/15/2021] morphine (MSIR) 15 MG tablet Take 1 tablet (15 mg total) by mouth every 6 (six) hours as needed for moderate pain or severe pain. Must last 30 days. 11/30/2020: WARNING: Not a Duplicate. Future prescription. DO NOT DELETE during hospital medication reconciliation or at discharge. [facility] Patient  naphazoline-pheniramine (VISINE) 0.025-0.3 % ophthalmic solution 1 drop 4 (four) times daily as needed for eye irritation.    polyethylene glycol (MIRALAX / GLYCOLAX) 17 g packet Take 17 g by mouth daily.    sodium chloride (OCEAN) 0.65 % SOLN nasal spray Place 1 spray into both nostrils daily as needed for congestion (Dryness).    No facility-administered encounter medications on file as of 01/21/2021.    Patient Active Problem List   Diagnosis Date Noted   Skin abnormalities 01/18/2021   Epigastric abdominal pain    Intractable nausea and vomiting    Ileus ([place]) 01/14/2021    Atrial fibrillation with RVR (HCC)    Enteritis    Chest pain    Chronic shoulder pain (Right) 11/30/2020   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   Arthropathy of shoulder (Right) 11/30/2020   C7 radiculopathy (Right) 11/30/2020   Cervical radiculopathy at C8 (Right) 11/30/2020   Muscle weakness of upper extremity (Right) 11/30/2020   Weakness of upper extremity (Right) 11/30/2020   Cervical fusion syndrome 11/30/2020   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   Chronic use of opiate for therapeutic purpose 08/18/2020   Persistent atrial fibrillation ([place]) 06/30/2020   Abnormal MRI, cervical spine (05/12/2020) 06/24/2020   Chronic upper back pain 06/24/2020   Enthesopathy of shoulder (Left) 05/13/2020   Osteoarthritis of shoulder (Left) 05/13/2020   Symptoms referable to shoulder joint 05/13/2020   Tendinopathy of rotator cuff (Left) 05/13/2020   Sprain of supraspinatus muscle or tendon, sequela (Left) 05/13/2020   Subdeltoid bursitis of shoulder (Left) 05/13/2020   Subacromial bursitis of shoulder (Left) 05/13/2020   Chronic shoulder pain (Left) 05/13/2020   Acute postoperative pain 05/13/2020   History of allergy to iodine 05/13/2020    Class: History of   Abnormal MRI, shoulder (03/20/2017) 04/21/2020   Chronic anticoagulation (Eliquis) 04/21/2020   Chronic neck pain with history of cervical spinal surgery 04/21/2020   Multiple allergies 04/21/2020   S/P repair of paraesophageal hernia 02/26/2020   Heartburn    Gastritis without bleeding    Typical atrial flutter (HCC)    Foraminal stenosis of cervical region (C3-4) (Right) 08/26/2019   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) 08/26/2019   DDD (degenerative disc disease), cervical 08/25/2019   Cervical radiculitis (C4,C5,C8) (Left) 08/20/2019   Pure hypercholesterolemia 06/27/2019   Constipation due to opioid therapy 06/27/2019   Snoring 06/27/2019   Unstable angina ([place]) 06/10/2019    Chronic musculoskeletal pain 01/14/2019   Hypertension 12/27/2018   Spondylosis without myelopathy or radiculopathy, lumbosacral region 11/12/2018   History of allergy to radiographic contrast media 11/12/2018   History of allergy to shellfish 11/12/2018   DDD (degenerative disc disease), lumbosacral 07/31/2018   Neuropathic pain 07/31/2018   Neurogenic pain 07/31/2018   Vitamin D deficiency 05/28/2018   History of lumbar surgery 05/28/2018   Groin pain, chronic, left 05/20/2018   Other intervertebral disc degeneration, lumbar region 05/20/2018   Disorder of skeletal system 05/20/2018   Problems influencing health status 05/20/2018   Overweight 12/25/2017   Enthesopathy of hip region (Left) 07/13/2017   Trigger point of shoulder region (Left) 02/26/2017   Pharmacologic therapy    Polyp of sigmoid colon    Problems with swallowing and mastication    Chronic shoulder arthropathy (Left) 01/04/2017   Dysphagia 12/07/2016   Abnormal flushing and sweating 12/07/2016   Long term prescription benzodiazepine use 10/11/2016   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) 10/11/2016   Chronic  hip pain (2ry area of Pain) (Bilateral) (L>R) 10/11/2016   Osteoarthritis of shoulder (Bilateral) (L>R) 05/17/2016   Cervicogenic headache 07/06/2015   History of total hip replacement (THR) (Right) 07/05/2015   Chronic shoulder radicular pain (Bilateral) (L>R) 07/05/2015   Lumbar facet syndrome (Bilateral) (L>R) 07/05/2015   Chronic low back pain (1ry area of Pain) (Bilateral) (L>R) w/o sciatica 04/05/2015   Chronic hip pain (Right) 01/04/2015   Encounter for therapeutic drug level monitoring 01/04/2015   Long term current use of opiate analgesic 01/04/2015   Long term prescription opiate use 01/04/2015   Opiate use 01/04/2015   Chronic pain syndrome 01/04/2015   Steroid intolerance 01/04/2015   Cervical spondylosis 01/04/2015   Cervical facet arthropathy (Bilateral) 01/04/2015    History of cervical spinal surgery 01/04/2015   Chronic shoulder pain (3ry area of Pain) (Bilateral) (L>R) 01/04/2015   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   Epidural fibrosis 01/04/2015   Lumbar spondylosis 01/04/2015   Cervical facet syndrome (Bilateral) 01/04/2015   Chronic sacroiliac joint pain (Left) 01/04/2015   DOE (dyspnea on exertion) 09/03/2014   Asthma, mild 08/18/2014   Blurred vision 08/18/2014   Benign paroxysmal positional nystagmus 08/18/2014   Clinical depression 08/18/2014   Fatigue 08/18/2014   Cannot sleep 08/18/2014   Palpitations 08/18/2014   RAD (reactive airway disease) 08/18/2014   OSA (obstructive sleep apnea) 08/18/2014   Lightheadedness 04/22/2013   Chronic neck pain 04/16/2013   Anxiety state 08/03/2003   Colon, diverticulosis 07/15/2003   IBS (irritable bowel syndrome) 04/27/2003   Esophagitis, reflux 02/11/2003   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 1 0 0 1 0  Comment - - - - -  Number falls in past yr: 1 - - 1 -  Comment - - - - -  Injury with Fall? 0 - - 0 -  Comment - - - - -  Risk Factor Category  - - - - -  Comment - - - - -  Risk for fall due to : History of fall(s);Medication side effect;Impaired mobility - - Other (Comment) -  Risk for fall due to: Comment Reports drop foot - - Due to having drop foot. -  Follow up Falls prevention discussed - - - -  Comment - - - - -    Current Barriers:  Risk for Falls related to Impaired Gait  Clinical Goal(s):  Over the next 120 days, patient will not experience falls for require hospitalization for fall related injuries.  Interventions:   Collaboration with Brita Romp Dionne Bucy, MD regarding development and update of comprehensive plan of care as evidenced by provider attestation and co-signature Inter-disciplinary care team collaboration (see longitudinal plan of care) Assessed for falls.  Reviewed medications and potential side effects  Discussed safety and fall prevention measures. Advised to keep assistive device readily available for use when needed. Discussed ability to perform ADL's and possible need for additional safety devices in the home. Performs  ADL's independently. Denies current need for additional devices or assistance in the home.   Self-Care/Patient Goals: Utilize assistive device appropriately with all ambulation Ensure pathways are clear and well  it Change positions slowly Wear secure fitting non skid footwear with all ambulation Contact the clinic if functional status or ability to perform ADL's changes     Patient Care Plan: Asthma (Adult)     Problem Identified: Symptom Exacerbation (Asthma)      Long-Range Goal: Symptom Exacerbation Prevented or Minimized   Start Date: 11/12/2020  Expected End Date: 03/12/2021  Priority: Medium  Note:   Current Barriers:  Chronic Disease Management support and educational needs related to Asthma.  Case Manager Clinical Goal(s): Over the next 120 days, patient will not require emergent care due to complications related to Asthma exacerbation.  Interventions:  Collaboration with Brita Romp Dionne Bucy, MD regarding development and update of comprehensive plan of care as evidenced by provider attestation and co-signature Inter-disciplinary care team collaboration (see longitudinal plan of care) Provided verbal information regarding Asthma self care/management/and exacerbation prevention. Reports symptoms are usually triggered during the fall. Denies recent episodes of shortness of breath or symptoms related to asthma exacerbation.  Discussed current action plan  and reinforced importance of daily self assessment.  Reviewed worsening symptoms that require immediate medical attention.  Provided information regarding respiratory infection prevention.  Advised to utilize  prevention strategies to reduce risk of respiratory infection.   Self-Care Activities/Patient Goals: Take medications as prescribed Assess symptoms daily  Follow recommendations to prevent asthma exacerbation and respiratory infection Notify provider or care management team with questions and new concerns as needed          PLAN A member of the care management team will follow up within the next month.   Cristy [facility] Care Management [facility] [phone]

## 2021-01-24 DIAGNOSIS — G4733 Obstructive sleep apnea (adult) (pediatric): Secondary | ICD-10-CM | POA: Diagnosis not present

## 2021-01-25 ENCOUNTER — Ambulatory Visit: Payer: Medicare Other | Admitting: Nurse Practitioner

## 2021-01-25 ENCOUNTER — Ambulatory Visit: Payer: Self-pay

## 2021-01-25 ENCOUNTER — Encounter: Payer: Self-pay | Admitting: Nurse Practitioner

## 2021-01-25 ENCOUNTER — Other Ambulatory Visit: Payer: Self-pay

## 2021-01-25 ENCOUNTER — Telehealth: Payer: Self-pay

## 2021-01-25 VITALS — BP 120/80 | HR 73 | Ht 64.0 in | Wt 144.0 lb

## 2021-01-25 DIAGNOSIS — I10 Essential (primary) hypertension: Secondary | ICD-10-CM

## 2021-01-25 DIAGNOSIS — I480 Paroxysmal atrial fibrillation: Secondary | ICD-10-CM

## 2021-01-25 DIAGNOSIS — R1013 Epigastric pain: Secondary | ICD-10-CM | POA: Diagnosis not present

## 2021-01-25 DIAGNOSIS — K8689 Other specified diseases of pancreas: Secondary | ICD-10-CM

## 2021-01-25 MED ORDER — DILTIAZEM HCL 30 MG PO TABS: 30.0000 mg | ORAL_TABLET | Freq: Four times a day (QID) | ORAL | 0 refills | Status: DC | PRN

## 2021-01-25 NOTE — Chronic Care Management (AMB) (Signed)
Chronic Care Management   CCM RN Visit Note  01/25/2021 Name: [name] MRN: [id number] DOB: May 04, 1960  Subjective: [name] is a 60 y.o. year old female who is a primary care patient of Bacigalupo, Dionne Bucy, MD. The care management team was consulted for assistance with disease management and care coordination needs.    Engaged with patient by telephone for follow up visit in response to provider referral for case management and care coordination services.   Consent to Services:  The patient was given information about Chronic Care Management services, agreed to services, and gave verbal consent prior to initiation of services.  Please see initial visit note for detailed documentation.    Assessment: Review of patient past medical history, allergies, medications, health status, including review of consultants reports, laboratory and other test data, was performed as part of comprehensive evaluation and provision of chronic care management services.   SDOH (Social Determinants of Health) assessments and interventions performed:  No  CCM Care Plan  Allergies  Allergen Reactions   Amoxicillin Hives    She did ok w ANCEF   Chlorhexidine Gluconate Dermatitis and Hives   Clindamycin Hives   Codeine Hives   Erythromycin Hives    "mycins" in general   Penicillin G Hives    "cillins" in general   Sulfa Antibiotics Nausea And Vomiting and Hives        Levofloxacin Hives   Shellfish Allergy Hives   Decadron [Dexamethasone] Other (See Comments)    Hot flashes, insomnia, "manic" Hot flashes, insomnia, "manic"   Mangifera Indica Hives    papaya   Papaya Derivatives Hives   Betadine [Povidone Iodine] Hives        Clarithromycin Hives   Other Hives     Mango    Povidone-Iodine Hives   Prednisone Anxiety    High blood pressure, flushed, mood changes, heart palpitations High blood pressure, flushed, mood changes, heart palpitations    Outpatient Encounter  Medications as of 01/25/2021  Medication Sig   ALPRAZolam (XANAX) 1 MG tablet Take 1 mg by mouth at bedtime.    apixaban (ELIQUIS) 5 MG TABS tablet Take 1 tablet (5 mg total) by mouth 2 (two) times daily.   busPIRone (BUSPAR) 7.5 MG tablet Take 7.5 mg by mouth 2 (two) times daily.   diltiazem (CARDIZEM CD) 120 MG 24 hr capsule Take 1 capsule (120 mg total) by mouth daily.   diltiazem (CARDIZEM) 30 MG tablet Take 1 tablet (30 mg total) by mouth every 6 (six) hours as needed.   LINZESS 290 MCG CAPS capsule TAKE 1 CAPSULE BY MOUTH DAILY BEFORE BREAKFAST.   Menthol, Topical Analgesic, (BENGAY EX) Apply 1 application topically daily as needed (pain).   montelukast (SINGULAIR) 10 MG tablet TAKE 1 TABLET BY MOUTH EVERY DAY   morphine (MSIR) 15 MG tablet Take 1 tablet (15 mg total) by mouth every 6 (six) hours as needed for moderate pain or severe pain. Must last 30 days.   [START ON 02/15/2021] morphine (MSIR) 15 MG tablet Take 1 tablet (15 mg total) by mouth every 6 (six) hours as needed for moderate pain or severe pain. Must last 30 days.   naphazoline-pheniramine (VISINE) 0.025-0.3 % ophthalmic solution 1 drop 4 (four) times daily as needed for eye irritation.   PANCREATIN PO Take by mouth. 2 tablets before meals and 1 tablet before snacks.   pantoprazole (PROTONIX) 40 MG tablet TAKE 1 TABLET BY MOUTH TWICE A DAY   polyethylene glycol (  MIRALAX / GLYCOLAX) 17 g packet Take 17 g by mouth daily.   sodium chloride (OCEAN) 0.65 % SOLN nasal spray Place 1 spray into both nostrils daily as needed for congestion (Dryness).   No facility-administered encounter medications on file as of 01/25/2021.    Patient Active Problem List   Diagnosis Date Noted   Skin abnormalities 01/18/2021   Epigastric abdominal pain    Intractable nausea and vomiting    Ileus ([place]) 01/14/2021   Atrial fibrillation with RVR (HCC)    Enteritis    Chest pain    Chronic shoulder pain (Right) 11/30/2020   Osteoarthritis of  shoulder (Right) 11/30/2020   Arthropathy of shoulder (Right) 11/30/2020   C7 radiculopathy (Right) 11/30/2020   Cervical radiculopathy at C8 (Right) 11/30/2020   Muscle weakness of upper extremity (Right) 11/30/2020   Weakness of upper extremity (Right) 11/30/2020   Cervical fusion syndrome 11/30/2020   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   Chronic use of opiate for therapeutic purpose 08/18/2020   Persistent atrial fibrillation ([place]) 06/30/2020   Abnormal MRI, cervical spine (05/12/2020) 06/24/2020   Chronic upper back pain 06/24/2020   Enthesopathy of shoulder (Left) 05/13/2020   Osteoarthritis of shoulder (Left) 05/13/2020   Symptoms referable to shoulder joint 05/13/2020   Tendinopathy of rotator cuff (Left) 05/13/2020   Sprain of supraspinatus muscle or tendon, sequela (Left) 05/13/2020   Subdeltoid bursitis of shoulder (Left) 05/13/2020   Subacromial bursitis of shoulder (Left) 05/13/2020   Chronic shoulder pain (Left) 05/13/2020   Acute postoperative pain 05/13/2020   History of allergy to iodine 05/13/2020    Class: History of   Abnormal MRI, shoulder (03/20/2017) 04/21/2020   Chronic anticoagulation (Eliquis) 04/21/2020   Chronic neck pain with history of cervical spinal surgery 04/21/2020   Multiple allergies 04/21/2020   S/P repair of paraesophageal hernia 02/26/2020   Heartburn    Gastritis without bleeding    Typical atrial flutter (HCC)    Foraminal stenosis of cervical region (C3-4) (Right) 08/26/2019   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) 08/26/2019   DDD (degenerative disc disease), cervical 08/25/2019   Cervical radiculitis (C4,C5,C8) (Left) 08/20/2019   Pure hypercholesterolemia 06/27/2019   Constipation due to opioid therapy 06/27/2019   Snoring 06/27/2019   Unstable angina ([place]) 06/10/2019   Chronic musculoskeletal pain 01/14/2019   Hypertension 12/27/2018   Spondylosis without myelopathy or radiculopathy, lumbosacral  region 11/12/2018   History of allergy to radiographic contrast media 11/12/2018   History of allergy to shellfish 11/12/2018   DDD (degenerative disc disease), lumbosacral 07/31/2018   Neuropathic pain 07/31/2018   Neurogenic pain 07/31/2018   Vitamin D deficiency 05/28/2018   History of lumbar surgery 05/28/2018   Groin pain, chronic, left 05/20/2018   Other intervertebral disc degeneration, lumbar region 05/20/2018   Disorder of skeletal system 05/20/2018   Problems influencing health status 05/20/2018   Overweight 12/25/2017   Enthesopathy of hip region (Left) 07/13/2017   Trigger point of shoulder region (Left) 02/26/2017   Pharmacologic therapy    Polyp of sigmoid colon    Problems with swallowing and mastication    Chronic shoulder arthropathy (Left) 01/04/2017   Dysphagia 12/07/2016   Abnormal flushing and sweating 12/07/2016   Long term prescription benzodiazepine use 10/11/2016   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) 10/11/2016   Chronic hip pain (2ry area of Pain) (Bilateral) (L>R) 10/11/2016   Osteoarthritis of shoulder (Bilateral) (L>R) 05/17/2016   Cervicogenic headache 07/06/2015  History of total hip replacement (THR) (Right) 07/05/2015   Chronic shoulder radicular pain (Bilateral) (L>R) 07/05/2015   Lumbar facet syndrome (Bilateral) (L>R) 07/05/2015   Chronic low back pain (1ry area of Pain) (Bilateral) (L>R) w/o sciatica 04/05/2015   Chronic hip pain (Right) 01/04/2015   Encounter for therapeutic drug level monitoring 01/04/2015   Long term current use of opiate analgesic 01/04/2015   Long term prescription opiate use 01/04/2015   Opiate use 01/04/2015   Chronic pain syndrome 01/04/2015   Steroid intolerance 01/04/2015   Cervical spondylosis 01/04/2015   Cervical facet arthropathy (Bilateral) 01/04/2015   History of cervical spinal surgery 01/04/2015   Chronic shoulder pain (3ry area of Pain) (Bilateral) (L>R) 01/04/2015   Failed back  surgical syndrome (surgery by Dr. Rolena Infante) 01/04/2015   Epidural fibrosis 01/04/2015   Lumbar spondylosis 01/04/2015   Cervical facet syndrome (Bilateral) 01/04/2015   Chronic sacroiliac joint pain (Left) 01/04/2015   DOE (dyspnea on exertion) 09/03/2014   Asthma, mild 08/18/2014   Blurred vision 08/18/2014   Benign paroxysmal positional nystagmus 08/18/2014   Clinical depression 08/18/2014   Fatigue 08/18/2014   Cannot sleep 08/18/2014   Palpitations 08/18/2014   RAD (reactive airway disease) 08/18/2014   OSA (obstructive sleep apnea) 08/18/2014   Lightheadedness 04/22/2013   Chronic neck pain 04/16/2013   Anxiety state 08/03/2003   Colon, diverticulosis 07/15/2003   IBS (irritable bowel syndrome) 04/27/2003   Esophagitis, reflux 02/11/2003   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    Patient Care Plan: RN Care Management Plan of Care     Problem Identified: A-Fib, Asthma, HTN, Pancreatic Insuffiency and Falls      Long-Range Goal: Disease Progression Prevented or Minimized   Start Date: 01/25/2021  Expected End Date: 04/25/2021  Priority: High  Note:   Current Barriers:  Chronic Disease Management support and education needs related to Atrial Fibrillation, HTN, Asthma, Pancreatic Insufficiency and Fall Risk  RNCM Clinical Goal(s):  Patient will demonstrate Ongoing adherence to prescribed treatment plan for Atrial Fibrillation, HTN, Asthma, Pancreatic Insufficiency and Fall Risk as evidenced by collaboration with the care management team.  Interventions: 1:1 collaboration with primary care provider regarding development and update of comprehensive plan of care as evidenced by provider attestation and co-signature Inter-disciplinary care team collaboration (see longitudinal plan of care) Evaluation of current treatment plan related to  self management and patient's adherence to plan as established by provider   Hypertension Interventions: Last practice  recorded BP readings:  BP Readings from Last 3 Encounters:  01/18/21 102/80  01/17/21 (!) 110/54  11/30/20 (!) 140/99  Most recent eGFR/CrCl:  Lab Results  Component Value Date   EGFR 91 01/19/2021    No components found for: CRCL  Reviewed medications and compliance with treatment plan. Advised to continue taking medications as prescribed Provided information regarding established blood pressure parameters along with indications for notifying a provider. Encouraged to monitor and record readings.  Discussed compliance with recommended cardiac prudent diet. Encouraged to read nutrition labels and avoid highly processed foods when possible. Discussed complications of uncontrolled blood pressure.  Reviewed s/sx of heart attack, stroke and worsening symptoms that require immediate medical attention.   Pancreatic Insufficiency Reviewed treatment plan related to pancreatic insufficiency. Reviewed medications. Reports taking medications as prescribed. Reports being provided with samples of Creon. Requesting assistance with contacting the Gastroenterology clinic regarding need for more samples. Anticipates being out of medication within the week.  [facility] regarding patient's need for additional Creon. Pending return  call. Discussed plan with patient. Advised to continue taking medications as instructed and eating small meals and snacks as tolerated.  Reviewed worsening symptoms that require immediate medical attention.    Patient Goals/Self-Care Activities: Patient will self administer medications as prescribed Patient will attend all scheduled provider appointments Patient will call pharmacy for medication refills Patient will continue to perform ADL's independently Patient will continue to perform IADL's independently Patient will call provider office for new concerns or questions   Follow Up Plan:   Will follow up within the next week         PLAN A  member of the care management team will follow up within the next week.   Cristy [facility] Care Management [facility] [phone]

## 2021-01-25 NOTE — Progress Notes (Signed)
Office Visit    Patient Name: [name] Date of Encounter: 01/25/2021  Primary Care Provider:  Virginia Crews, MD Primary Cardiologist:  Kate Sable, MD  Chief Complaint    60 year old female with history of paroxysmal atrial fibrillation/flutter, hypertension, asthma, anxiety, GERD, sleep apnea, who presents for follow-up of paroxysmal atrial fibrillation.  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    Chronic headaches    Chronic, continuous use of opioids    DDD (degenerative disc disease)    CERVICAL AND LUMBAR-CHRONIC PAIN, RT HIP LABRAL TEAR   Depression    PT STATES A LOT OF STRESS IN HER LIFE   Dissociative disorder    Dizziness 04/22/2013   Duodenal ulcer with hemorrhage and perforation ([place]) 04/27/2003   Foot drop, right    FROM BACK SURGERY   GERD (gastroesophageal reflux disease)    H/O arthrodesis 08/18/2014   Headache(784.0)    AND NECK PAIN--STATES RECENT TEST SHOW CERVICAL DEGENERATION   History of blood transfusion    s/p back surgery   History of cardiac cath    a. 05/2019 Cath: Nl cors. EF 55-65%.   History of cardioversion    History of cervical spinal surgery 01/04/2015   History of kidney stones    Hypertension    Inverted T wave    Mitral regurgitation    a. 07/2020 Echo: EF 60-65%, no rwma. Nl RV size/fxn. RVSP 39.41mmHg. Mildly to mod dil LA. Mod MR; b. 07/2020 TEE: EF 55-60%. Lambl's excresence. Nl RV size/fxn. Midly dil LA. No LA/LAA thrombus. Mild MR.   Narrowing of intervertebral disc space 08/18/2014   Currently on disability.     Orthostatic hypotension 04/22/2013   Pain    CHRONIC NECK AND BACK PAIN - LIMITED ROM NECK - S/P FUSIONS CERVICAL AND LUMBAR   Paroxysmal atrial fibrillation ([place]) 2021   a. 07/2020 s/p PVI.   Pneumonia    PONV (postoperative nausea and vomiting)    PT GIVES HX OF N&V AND FEVER WITH SURGERIES YEARS AGO--BUT NO PROBLEMS WITH MORE RECENT SURGERIES--STATES NOT MALIGNANT HYPERTHERMIA   Postop Hyponatremia 05/14/2012   Postoperative anemia due to acute blood loss 05/14/2012   PTSD (post-traumatic stress disorder)    Right foot drop    Right hip arthralgia 08/18/2014   Status post surgery of right, and now needs left.    Sleep apnea 2021   does not have a cpap   Therapeutic opioid-induced constipation (OIC)    Typical atrial flutter [facility])    Past Surgical History:  Procedure Laterality Date   ABDOMINAL HYSTERECTOMY     afib  05/2019   ANTERIOR CERVICAL DECOMP/DISCECTOMY FUSION N/A 10/30/2012   Procedure: ACDF C5-6, EXPLORATION AND HARDWARE REMOVAL C6-7;  Surgeon: Melina Schools, MD;  Location: [place];  Service: Orthopedics;  Laterality: N/A;   ANTERIOR FUSION CERVICAL SPINE  MAY 2012   AT [place] N/A 08/06/2020   Procedure: ATRIAL FIBRILLATION ABLATION;  Surgeon: Vickie Epley, MD;  Location: [place] [facility];  Service: Cardiovascular;  Laterality: N/A;   BACK SURGERY  2009   LUMBAR FUSION WITH RODS    BREAST BIOPSY Right 2008   benign.- bx/clip   BREAST EXCISIONAL BIOPSY Left 1998   benign   BREAST REDUCTION SURGERY Bilateral 06/2016   CARDIAC ELECTROPHYSIOLOGY MAPPING AND ABLATION     CARDIOVERSION N/A 12/03/2019   Procedure: CARDIOVERSION;  Surgeon: Kate Sable, MD;  Location: [facility];  Service: Cardiovascular;  Laterality: N/A;   CARPAL TUNNEL RELEASE  05-06-12   Right   CHOLECYSTECTOMY     COLONOSCOPY WITH PROPOFOL N/A 01/26/2017   Procedure: COLONOSCOPY WITH PROPOFOL;  Surgeon: Lucilla Lame, MD;  Location: [place];  Service:  Endoscopy;  Laterality: N/A;   COLONOSCOPY WITH PROPOFOL N/A 01/16/2021   Procedure: COLONOSCOPY WITH PROPOFOL;  Surgeon: Lin Landsman, MD;  Location: [facility];  Service: Gastroenterology;  Laterality: N/A;   DIAGNOSTIC LAPAROSCOPIES - MULTIPLE FOR ENDOMETRIOSIS     ESOPHAGEAL DILATION N/A 01/26/2017   Procedure: ESOPHAGEAL DILATION;  Surgeon: Lucilla Lame, MD;  Location: [place];  Service: Endoscopy;  Laterality: N/A;   ESOPHAGEAL MANOMETRY N/A 05/30/2017   Procedure: ESOPHAGEAL MANOMETRY (EM);  Surgeon: Lucilla Lame, MD;  Location: [facility];  Service: Endoscopy;  Laterality: N/A;   ESOPHAGOGASTRODUODENOSCOPY (EGD) WITH PROPOFOL N/A 01/26/2017   Procedure: ESOPHAGOGASTRODUODENOSCOPY (EGD) WITH PROPOFOL;  Surgeon: Lucilla Lame, MD;  Location: [place];  Service: Endoscopy;  Laterality: N/A;   ESOPHAGOGASTRODUODENOSCOPY (EGD) WITH PROPOFOL N/A 01/30/2020   Procedure: ESOPHAGOGASTRODUODENOSCOPY (EGD) WITH PROPOFOL;  Surgeon: Lucilla Lame, MD;  Location: [place];  Service: Endoscopy;  Laterality: N/A;  sleep apnea COVID + 01-15-20   ESOPHAGOGASTRODUODENOSCOPY (EGD) WITH PROPOFOL N/A 01/15/2021   Procedure: ESOPHAGOGASTRODUODENOSCOPY (EGD) WITH PROPOFOL;  Surgeon: Lin Landsman, MD;  Location: [place];  Service: Gastroenterology;  Laterality: N/A;   HIP ARTHROSCOPY  09/20/2011   Procedure: ARTHROSCOPY HIP;  Surgeon: Gearlean Alf, MD;  Location: [facility];  Service: Orthopedics;  Laterality: Right;  Right Hip Scope with Labral Debridement   LEFT HEART CATH AND CORONARY ANGIOGRAPHY Left 06/10/2019   Procedure: LEFT HEART CATH AND CORONARY ANGIOGRAPHY;  Surgeon: Nelva Bush, MD;  Location: [place] [facility];  Service: Cardiovascular;  Laterality: Left;   NASAL SEPTUM SURGERY  MARCH 2013   IN [place]   [facility] IMPEDANCE STUDY N/A 05/30/2017   Procedure: [place] IMPEDANCE STUDY;  Surgeon: Lucilla Lame, MD;  Location: [facility];  Service:  Endoscopy;  Laterality: N/A;   POLYPECTOMY N/A 01/26/2017   Procedure: POLYPECTOMY;  Surgeon: Lucilla Lame, MD;  Location: [place];  Service: Endoscopy;  Laterality: N/A;   POSTERIOR CERVICAL FUSION/FORAMINOTOMY N/A 04/16/2013   Procedure: REMOVAL CERVICAL PLATES AND INTERBODY CAGE/POSTERIOR CERVICAL SPINAL FUSION C4 - C6/C5 CORPECTOMY/C4 - C6 FUSION WITH ILIAC CREST BONE GRAFT;  Surgeon: Melina Schools, MD;  Location: [place];  Service: Orthopedics;  Laterality: N/A;   RADIOFREQUENCY ABLATION NERVES     REDUCTION MAMMAPLASTY Bilateral 05/2016   RIGHT HIP ARTHROSCOPY FOR LABRAL TEAR  ABOUT 2010   2012 also   SHOULDER ARTHROSCOPY  05-06-12   bone spur   TEE WITHOUT CARDIOVERSION N/A 08/05/2020   Procedure: TRANSESOPHAGEAL ECHOCARDIOGRAM (TEE);  Surgeon: Lelon Perla, MD;  Location: [place];  Service: Cardiovascular;  Laterality: N/A;   TONSILLECTOMY     AS A CHILD   TOTAL HIP ARTHROPLASTY Right 05/13/2012   Procedure: TOTAL HIP ARTHROPLASTY ANTERIOR APPROACH;  Surgeon: Gearlean Alf,  MD;  Location: [facility];  Service: Orthopedics;  Laterality: Right;    Allergies  Allergies  Allergen Reactions   Amoxicillin Hives    She did ok w ANCEF   Chlorhexidine Gluconate Dermatitis and Hives   Clindamycin Hives   Codeine Hives   Erythromycin Hives    "mycins" in general   Penicillin G Hives    "cillins" in general   Sulfa Antibiotics Nausea And Vomiting and Hives        Levofloxacin Hives   Shellfish Allergy Hives   Decadron [Dexamethasone] Other (See Comments)    Hot flashes, insomnia, "manic" Hot flashes, insomnia, "manic"   Mangifera Indica Hives    papaya   Papaya Derivatives Hives   Betadine [Povidone Iodine] Hives        Clarithromycin Hives   Other Hives     Mango    Povidone-Iodine Hives   Prednisone Anxiety    High blood pressure, flushed, mood changes, heart palpitations High blood pressure, flushed, mood changes, heart palpitations    History of  Present Illness    60 year old female with above past medical history including paroxysmal atrial fibrillation/flutter, hypertension, sleep apnea, asthma, anxiety, and GERD.  She previously underwent diagnostic catheterization in March 2021, which did not show any significant CAD.  Echo in April 2021, showed normal LV function.  Monitoring showed A. fib with a 3% burden, and she was placed on beta-blocker and Eliquis.  She subsequently required cardioversion and amiodarone therapy was started in September 2021.  Follow-up monitoring in March 2022, showed runs of SVT and atrial fibrillation, as well as bradycardia with rates in the 30s.  She was referred to electrophysiology initially seen by Dr. Caryl Comes, who felt that minimize exposure to amiodarone was appropriate, especially as it might be playing a role in her bradycardia.  She was referred to Dr. Quentin Ore for consideration of catheter ablation and subsequently underwent successful catheter ablation for A. fib and flutter in May 2022.  Post ablation, Ms. Conrow continues to experience symptoms of fatigue and mild intermittent nausea and anorexia and amiodarone was subsequently discontinued.  Unfortunately, on November 3, patient developed acute onset of tachycardia with abdominal discomfort and nausea.  She presented to the emergency department where she was in A. fib with RVR.  She was also hypertensive.  She was started on IV diltiazem and subsequently converted to sinus rhythm after 3 to 4 hours.  With abdominal pain and nausea, she underwent imaging which was consistent with ileus and constipation.  She did have recurrent A. fib with RVR following an EGD, which was again managed with IV diltiazem and our team recommended continuing Cardizem CD 120 mg daily and Eliquis 5 mg twice daily once procedures were completed.  Following discharge, she was doing well at primary care visit on November 8.  She notes that she experiences brief episodes of A. fib lasting  just a few minutes, several times per week.  She has not had any prolonged episodes.  From a GI standpoint, she has significant GI upset and says it feels like she drank a tank of acid.  She has been taking Pancrease since her discharge.  She has follow-up with GI in January.  She denies chest pain, dyspnea, PND, orthopnea, dizziness, syncope, edema, or early satiety.  Home Medications    Current Outpatient Medications  Medication Sig Dispense Refill   ALPRAZolam (XANAX) 1 MG tablet Take 1 mg by mouth at bedtime.      apixaban (ELIQUIS) 5  MG TABS tablet Take 1 tablet (5 mg total) by mouth 2 (two) times daily. 60 tablet 5   busPIRone (BUSPAR) 7.5 MG tablet Take 7.5 mg by mouth 2 (two) times daily.     diltiazem (CARDIZEM CD) 120 MG 24 hr capsule Take 1 capsule (120 mg total) by mouth daily. 30 capsule 0   diltiazem (CARDIZEM) 30 MG tablet Take 1 tablet (30 mg total) by mouth every 6 (six) hours as needed. 90 tablet 0   LINZESS 290 MCG CAPS capsule TAKE 1 CAPSULE BY MOUTH DAILY BEFORE BREAKFAST. 30 capsule 6   Menthol, Topical Analgesic, (BENGAY EX) Apply 1 application topically daily as needed (pain).     montelukast (SINGULAIR) 10 MG tablet TAKE 1 TABLET BY MOUTH EVERY DAY 90 tablet 1   morphine (MSIR) 15 MG tablet Take 1 tablet (15 mg total) by mouth every 6 (six) hours as needed for moderate pain or severe pain. Must last 30 days. 120 tablet 0   [START ON 02/15/2021] morphine (MSIR) 15 MG tablet Take 1 tablet (15 mg total) by mouth every 6 (six) hours as needed for moderate pain or severe pain. Must last 30 days. 120 tablet 0   naphazoline-pheniramine (VISINE) 0.025-0.3 % ophthalmic solution 1 drop 4 (four) times daily as needed for eye irritation.     PANCREATIN PO Take by mouth. 2 tablets before meals and 1 tablet before snacks.     pantoprazole (PROTONIX) 40 MG tablet TAKE 1 TABLET BY MOUTH TWICE A DAY 180 tablet 3   polyethylene glycol (MIRALAX / GLYCOLAX) 17 g packet Take 17 g by mouth  daily.     sodium chloride (OCEAN) 0.65 % SOLN nasal spray Place 1 spray into both nostrils daily as needed for congestion ([place]ry[place]).     No current facility-administered medications for this visit.     Review of Systems    Brief episodes of A. fib lasting just minutes at a time since hospital discharge.  Similar to what she was experiencing prior to hospitalization.  She denies chest pain, dyspnea, PND, orthopnea, dizziness, syncope, edema, or early satiety.  All other systems reviewed and are otherwise negative except as noted above.    Physical Exam    VS:  BP 120/80 (BP Location: Left Arm, Patient Position: Sitting, Cuff Size: Normal)   Pulse 73   Ht 5\' 4"  (1.626 m)   Wt 144 lb (65.3 kg)   SpO2 99%   BMI 24.72 kg/m  , BMI Body mass index is 24.72 kg/m.     GEN: Well nourished, well developed, in no acute distress. HEENT: normal. Neck: Supple, no JVD, carotid bruits, or masses. Cardiac: RRR, no murmurs, rubs, or gallops. No clubbing, cyanosis, edema.  Radials/ PT 2+ and equal bilaterally.  Respiratory:  Respirations regular and unlabored, clear to auscultation bilaterally. GI: Soft, nontender, nondistended, BS + x 4. MS: no deformity or atrophy. Skin: warm and dry, no rash. Neuro:  Strength and sensation are intact. Psych: Normal affect.  Accessory Clinical Findings    ECG personally reviewed by me today - regular sinus rhythm, 73 - no acute changes.  Lab Results  Component Value Date   WBC 7.4 01/14/2021   HGB 11.1 (L) 01/14/2021   HCT 33.8 (L) 01/14/2021   MCV 81.6 01/14/2021   PLT 313 01/14/2021   Lab Results  Component Value Date   CREATININE 0.75 01/19/2021   BUN 7 (L) 01/19/2021   NA 140 01/19/2021   K 4.2 01/19/2021  CL 105 01/19/2021   CO2 22 01/19/2021   Lab Results  Component Value Date   ALT 16 01/19/2021   AST 18 01/19/2021   ALKPHOS 135 (H) 01/19/2021   BILITOT 0.3 01/19/2021   Lab Results  Component Value Date   CHOL 138 01/19/2021    HDL 49 01/19/2021   LDLCALC 62 01/19/2021   TRIG 162 (H) 01/19/2021   CHOLHDL 2.9 01/13/2020    Lab Results  Component Value Date   HGBA1C 5.5 01/19/2021    Assessment & Plan    1.  Paroxysmal atrial fibrillation/flutter: Status post catheter ablation in May 2022 with subsequent discontinuation of amiodarone over the summer in the setting of nausea and anorexia.  Since, she has been experiencing brief episodes of A. fib occurring multiple days a week, lasting a few minutes, resolving spontaneously.  She was recently hospitalized with ileus and pancreatic insufficiency and in that setting, had episodic A. fib with RVR requiring IV diltiazem.  Since discharge, she has continued on diltiazem CD 120 mg daily with similar brief and episodic paroxysms of A. fib.  For the most part, these do not bother her much.  She continues have abdominal discomfort.  I have added diltiazem 30 mg every 6 hours as needed breakthrough A. fib and discussed that she would best use this.  I encouraged her to have a low threshold to contact us for more prolonged episodes of A. fib.  She remains anticoagulated on Eliquis with stable labs earlier this month.  2.  Essential hypertension: Blood pressure stable.  3.  History of sinus bradycardia: Noted on previous monitoring in March 2022.  Heart rate stable off of amiodarone.  4.  Ileus/abdominal pain/pancreatic insufficiency: Now on Pancrease.  Patient to follow-up with GI in early January or sooner.  5.  Disposition: Follow-up with electrophysiology in 2 months or sooner if necessary.  Murray Hodgkins, NP 01/25/2021, 5:19 PM

## 2021-01-25 NOTE — Telephone Encounter (Signed)
Felicia, RN from [facility] left msg and stated that pt told her that she has been experiencing ongoing diarrhea on current dose of Linzess and would like advice.   I called pt, no answer, lmovm for her to return my call   Please advise on recommendations to offer pt when she returns my call if you can or what additional information you may need

## 2021-01-25 NOTE — Patient Instructions (Signed)
Medication Instructions:   Your physician has recommended you make the following change in your medication:  START Diltiazem - Take one tablet (30MG ) as needed for afib.   *If you need a refill on your cardiac medications before your next appointment, please call your pharmacy*   Lab Work:  None Ordered  If you have labs (blood work) drawn today and your tests are completely normal, you will receive your results only by: [place] (if you have MyChart) OR A paper copy in the mail If you have any lab test that is abnormal or we need to change your treatment, we will call you to review the results.   Testing/Procedures:  None Ordered   Follow-Up: At [facility], you and your health needs are our priority.  As part of our continuing mission to provide you with exceptional heart care, we have created designated Provider Care Teams.  These Care Teams include your primary Cardiologist (physician) and Advanced Practice Providers (APPs -  Physician Assistants and Nurse Practitioners) who all work together to provide you with the care you need, when you need it.  We recommend signing up for the patient portal called "MyChart".  Sign up information is provided on this After Visit Summary.  MyChart is used to connect with patients for Virtual Visits (Telemedicine).  Patients are able to view lab/test results, encounter notes, upcoming appointments, etc.  Non-urgent messages can be sent to your provider as well.   To learn more about what you can do with MyChart, go to [email or web address].    Your next appointment:   2 month(s)  The format for your next appointment:   In Person  Provider:   Lars Mage, MD{

## 2021-01-26 ENCOUNTER — Ambulatory Visit: Payer: Self-pay

## 2021-01-26 DIAGNOSIS — I10 Essential (primary) hypertension: Secondary | ICD-10-CM

## 2021-01-26 DIAGNOSIS — K8689 Other specified diseases of pancreas: Secondary | ICD-10-CM

## 2021-01-26 MED ORDER — LINACLOTIDE 145 MCG PO CAPS: 145.0000 ug | ORAL_CAPSULE | Freq: Every day | ORAL | 0 refills | Status: DC

## 2021-01-26 NOTE — Telephone Encounter (Signed)
Pt states she is out of [place] and if you would like her to continue, she requests Rx sent to [facility].... Please advise

## 2021-01-26 NOTE — Addendum Note (Signed)
Addended by: Lurlean Nanny on: 01/26/2021 12:36 PM   Modules accepted: Orders

## 2021-01-26 NOTE — Telephone Encounter (Signed)
Lucilla Lame, MD    Yes. Please decrease to 145 and if still having diarrhea then down to 72 mcg. Thanks

## 2021-01-26 NOTE — Telephone Encounter (Signed)
Pt is taking 290 mcg, so decrease to 145 mcg?

## 2021-01-26 NOTE — Chronic Care Management (AMB) (Addendum)
Chronic Care Management   CCM RN Visit Note  01/26/2021 Name: [name] MRN: [id number] DOB: 1960-10-14  Subjective: [name] is a 60 y.o. year old female who is a primary care patient of Bacigalupo, Dionne Bucy, MD. The care management team was consulted for assistance with disease management and care coordination needs.    Engaged with patient by telephone for follow up visit in response to provider referral for case management and care coordination services.   Consent to Services:  The patient was given information about Chronic Care Management services, agreed to services, and gave verbal consent prior to initiation of services.  Please see initial visit note for detailed documentation.    Assessment: Review of patient past medical history, allergies, medications, health status, including review of consultants reports, laboratory and other test data, was performed as part of comprehensive evaluation and provision of chronic care management services.   SDOH (Social Determinants of Health) assessments and interventions performed:  No  CCM Care Plan  Allergies  Allergen Reactions   Amoxicillin Hives    She did ok w ANCEF   Chlorhexidine Gluconate Dermatitis and Hives   Clindamycin Hives   Codeine Hives   Erythromycin Hives    "mycins" in general   Penicillin G Hives    "cillins" in general   Sulfa Antibiotics Nausea And Vomiting and Hives        Levofloxacin Hives   Shellfish Allergy Hives   Decadron [Dexamethasone] Other (See Comments)    Hot flashes, insomnia, "manic" Hot flashes, insomnia, "manic"   Mangifera Indica Hives    papaya   Papaya Derivatives Hives   Betadine [Povidone Iodine] Hives        Clarithromycin Hives   Other Hives     Mango    Povidone-Iodine Hives   Prednisone Anxiety    High blood pressure, flushed, mood changes, heart palpitations High blood pressure, flushed, mood changes, heart palpitations    Outpatient Encounter  Medications as of 01/26/2021  Medication Sig   ALPRAZolam (XANAX) 1 MG tablet Take 1 mg by mouth at bedtime.    apixaban (ELIQUIS) 5 MG TABS tablet Take 1 tablet (5 mg total) by mouth 2 (two) times daily.   busPIRone (BUSPAR) 7.5 MG tablet Take 7.5 mg by mouth 2 (two) times daily.   diltiazem (CARDIZEM CD) 120 MG 24 hr capsule Take 1 capsule (120 mg total) by mouth daily.   diltiazem (CARDIZEM) 30 MG tablet Take 1 tablet (30 mg total) by mouth every 6 (six) hours as needed.   linaclotide (LINZESS) 145 MCG CAPS capsule Take 1 capsule (145 mcg total) by mouth daily before breakfast.   Menthol, Topical Analgesic, (BENGAY EX) Apply 1 application topically daily as needed (pain).   montelukast (SINGULAIR) 10 MG tablet TAKE 1 TABLET BY MOUTH EVERY DAY   morphine (MSIR) 15 MG tablet Take 1 tablet (15 mg total) by mouth every 6 (six) hours as needed for moderate pain or severe pain. Must last 30 days.   [START ON 02/15/2021] morphine (MSIR) 15 MG tablet Take 1 tablet (15 mg total) by mouth every 6 (six) hours as needed for moderate pain or severe pain. Must last 30 days.   naphazoline-pheniramine (VISINE) 0.025-0.3 % ophthalmic solution 1 drop 4 (four) times daily as needed for eye irritation.   PANCREATIN PO Take by mouth. 2 tablets before meals and 1 tablet before snacks.   pantoprazole (PROTONIX) 40 MG tablet TAKE 1 TABLET BY MOUTH TWICE A DAY  polyethylene glycol (MIRALAX / GLYCOLAX) 17 g packet Take 17 g by mouth daily.   sodium chloride (OCEAN) 0.65 % SOLN nasal spray Place 1 spray into both nostrils daily as needed for congestion (Dryness).   No facility-administered encounter medications on file as of 01/26/2021.    Patient Active Problem List   Diagnosis Date Noted   Skin abnormalities 01/18/2021   Epigastric abdominal pain    Intractable nausea and vomiting    Ileus ([place]) 01/14/2021   Atrial fibrillation with RVR (HCC)    Enteritis    Chest pain    Chronic shoulder pain (Right)  11/30/2020   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   Arthropathy of shoulder (Right) 11/30/2020   C7 radiculopathy (Right) 11/30/2020   Cervical radiculopathy at C8 (Right) 11/30/2020   Muscle weakness of upper extremity (Right) 11/30/2020   Weakness of upper extremity (Right) 11/30/2020   Cervical fusion syndrome 11/30/2020   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   Chronic use of opiate for therapeutic purpose 08/18/2020   Persistent atrial fibrillation ([place]) 06/30/2020   Abnormal MRI, cervical spine (05/12/2020) 06/24/2020   Chronic upper back pain 06/24/2020   Enthesopathy of shoulder (Left) 05/13/2020   Osteoarthritis of shoulder (Left) 05/13/2020   Symptoms referable to shoulder joint 05/13/2020   Tendinopathy of rotator cuff (Left) 05/13/2020   Sprain of supraspinatus muscle or tendon, sequela (Left) 05/13/2020   Subdeltoid bursitis of shoulder (Left) 05/13/2020   Subacromial bursitis of shoulder (Left) 05/13/2020   Chronic shoulder pain (Left) 05/13/2020   Acute postoperative pain 05/13/2020   History of allergy to iodine 05/13/2020    Class: History of   Abnormal MRI, shoulder (03/20/2017) 04/21/2020   Chronic anticoagulation (Eliquis) 04/21/2020   Chronic neck pain with history of cervical spinal surgery 04/21/2020   Multiple allergies 04/21/2020   S/P repair of paraesophageal hernia 02/26/2020   Heartburn    Gastritis without bleeding    Typical atrial flutter (HCC)    Foraminal stenosis of cervical region (C3-4) (Right) 08/26/2019   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) 08/26/2019   DDD (degenerative disc disease), cervical 08/25/2019   Cervical radiculitis (C4,C5,C8) (Left) 08/20/2019   Pure hypercholesterolemia 06/27/2019   Constipation due to opioid therapy 06/27/2019   Snoring 06/27/2019   Unstable angina ([place]) 06/10/2019   Chronic musculoskeletal pain 01/14/2019   Hypertension 12/27/2018   Spondylosis without  myelopathy or radiculopathy, lumbosacral region 11/12/2018   History of allergy to radiographic contrast media 11/12/2018   History of allergy to shellfish 11/12/2018   DDD (degenerative disc disease), lumbosacral 07/31/2018   Neuropathic pain 07/31/2018   Neurogenic pain 07/31/2018   Vitamin D deficiency 05/28/2018   History of lumbar surgery 05/28/2018   Groin pain, chronic, left 05/20/2018   Other intervertebral disc degeneration, lumbar region 05/20/2018   Disorder of skeletal system 05/20/2018   Problems influencing health status 05/20/2018   Overweight 12/25/2017   Enthesopathy of hip region (Left) 07/13/2017   Trigger point of shoulder region (Left) 02/26/2017   Pharmacologic therapy    Polyp of sigmoid colon    Problems with swallowing and mastication    Chronic shoulder arthropathy (Left) 01/04/2017   Dysphagia 12/07/2016   Abnormal flushing and sweating 12/07/2016   Long term prescription benzodiazepine use 10/11/2016   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) 10/11/2016   Chronic hip pain (2ry area of Pain) (Bilateral) (L>R) 10/11/2016   Osteoarthritis of shoulder (Bilateral) (L>R) 05/17/2016   Cervicogenic headache 07/06/2015  History of total hip replacement (THR) (Right) 07/05/2015   Chronic shoulder radicular pain (Bilateral) (L>R) 07/05/2015   Lumbar facet syndrome (Bilateral) (L>R) 07/05/2015   Chronic low back pain (1ry area of Pain) (Bilateral) (L>R) w/o sciatica 04/05/2015   Chronic hip pain (Right) 01/04/2015   Encounter for therapeutic drug level monitoring 01/04/2015   Long term current use of opiate analgesic 01/04/2015   Long term prescription opiate use 01/04/2015   Opiate use 01/04/2015   Chronic pain syndrome 01/04/2015   Steroid intolerance 01/04/2015   Cervical spondylosis 01/04/2015   Cervical facet arthropathy (Bilateral) 01/04/2015   History of cervical spinal surgery 01/04/2015   Chronic shoulder pain (3ry area of Pain)  (Bilateral) (L>R) 01/04/2015   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   Epidural fibrosis 01/04/2015   Lumbar spondylosis 01/04/2015   Cervical facet syndrome (Bilateral) 01/04/2015   Chronic sacroiliac joint pain (Left) 01/04/2015   DOE (dyspnea on exertion) 09/03/2014   Asthma, mild 08/18/2014   Blurred vision 08/18/2014   Benign paroxysmal positional nystagmus 08/18/2014   Clinical depression 08/18/2014   Fatigue 08/18/2014   Cannot sleep 08/18/2014   Palpitations 08/18/2014   RAD (reactive airway disease) 08/18/2014   OSA (obstructive sleep apnea) 08/18/2014   Lightheadedness 04/22/2013   Chronic neck pain 04/16/2013   Anxiety state 08/03/2003   Colon, diverticulosis 07/15/2003   IBS (irritable bowel syndrome) 04/27/2003   Esophagitis, reflux 02/11/2003   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    Patient Care Plan: RN Care Management Plan of Care     Problem Identified: A-Fib, Asthma, HTN, Pancreatic Insuffiencey and Falls      Long-Range Goal: Disease Progression Prevented or Minimized   Start Date: 01/25/2021  Expected End Date: 04/25/2021  Priority: High  Note:   Current Barriers:  Chronic Disease Management support and education needs related to Atrial Fibrillation, HTN, Asthma, Pancreatic Insufficiency and Fall Risk  RNCM Clinical Goal(s):  Patient will demonstrate Ongoing adherence to prescribed treatment plan for Atrial Fibrillation, HTN, Asthma, Pancreatic Insufficiency and Fall Risk as evidenced by collaboration with the care management team.  Interventions: 1:1 collaboration with primary care provider regarding development and update of comprehensive plan of care as evidenced by provider attestation and co-signature Inter-disciplinary care team collaboration (see longitudinal plan of care) Evaluation of current treatment plan related to  self management and patient's adherence to plan as established by  provider   Hypertension Interventions: Last practice recorded BP readings:  BP Readings from Last 3 Encounters:  01/18/21 102/80  01/17/21 (!) 110/54  11/30/20 (!) 140/99  Most recent eGFR/CrCl:  Lab Results  Component Value Date   EGFR 91 01/19/2021    No components found for: CRCL  Reviewed medications and compliance with treatment plan. Advised to continue taking medications as prescribed Provided information regarding established blood pressure parameters along with indications for notifying a provider. Encouraged to monitor and record readings.  Discussed compliance with recommended cardiac prudent diet. Encouraged to read nutrition labels and avoid highly processed foods when possible. Discussed complications of uncontrolled blood pressure.  Reviewed s/sx of heart attack, stroke and worsening symptoms that require immediate medical attention.   Pancreatic Insufficiency Reviewed treatment plan related to pancreatic insufficiency. Discussed medications. Received call from [place] regarding patients request for additional medications. Informed that patient was provided with samples of Creon. The GI nurse will relay request to determine the provider's plan for long term pancreatic enzyme replacement and follow up this week.  Reviewed  plan with patient. Advised to continue taking medications as instructed and eating small meals and snacks as tolerated. She notes episodes of diarrhea but feels the symptoms are well controlled.  Reviewed worsening symptoms that require immediate medical attention.    Patient Goals/Self-Care Activities: Patient will self administer medications as prescribed Patient will attend all scheduled provider appointments Patient will call pharmacy for medication refills Patient will continue to perform ADL's independently Patient will continue to perform IADL's independently Patient will call provider office for new concerns or questions   Follow Up  Plan:   Will follow up within the next week     PLAN A member of the care management team will follow up within the next week.   Cristy [facility] Care Management [facility] [phone]

## 2021-01-26 NOTE — Telephone Encounter (Signed)
Linzess 145 sent to pharmacy, pt will update

## 2021-01-27 ENCOUNTER — Ambulatory Visit: Payer: Self-pay

## 2021-01-27 ENCOUNTER — Telehealth: Payer: Self-pay

## 2021-01-27 DIAGNOSIS — I10 Essential (primary) hypertension: Secondary | ICD-10-CM

## 2021-01-27 DIAGNOSIS — K8689 Other specified diseases of pancreas: Secondary | ICD-10-CM

## 2021-01-27 MED ORDER — CREON 36000-114000 UNITS PO CPEP: ORAL_CAPSULE | ORAL | 2 refills | Status: DC

## 2021-01-27 NOTE — Patient Instructions (Signed)
Thank you for allowing the Chronic Care Management team to participate in your care.  

## 2021-01-27 NOTE — Chronic Care Management (AMB) (Signed)
Chronic Care Management   CCM RN Visit Note  01/27/2021 Name: [name] MRN: [id number] DOB: 1961-01-23  Subjective: [name] is a 60 y.o. year old female who is a primary care patient of Bacigalupo, Dionne Bucy, MD. The care management team was consulted for assistance with disease management and care coordination needs.    Engaged with patient by telephone for follow up visit in response to provider referral for case management and care coordination services.   Consent to Services:  The patient was given information about Chronic Care Management services, agreed to services, and gave verbal consent prior to initiation of services.  Please see initial visit note for detailed documentation.   Assessment: Review of patient past medical history, allergies, medications, health status, including review of consultants reports, laboratory and other test data, was performed as part of comprehensive evaluation and provision of chronic care management services.   SDOH (Social Determinants of Health) assessments and interventions performed: No    CCM Care Plan  Allergies  Allergen Reactions   Amoxicillin Hives    She did ok w ANCEF   Chlorhexidine Gluconate Dermatitis and Hives   Clindamycin Hives   Codeine Hives   Erythromycin Hives    "mycins" in general   Penicillin G Hives    "cillins" in general   Sulfa Antibiotics Nausea And Vomiting and Hives        Levofloxacin Hives   Shellfish Allergy Hives   Decadron [Dexamethasone] Other (See Comments)    Hot flashes, insomnia, "manic" Hot flashes, insomnia, "manic"   Mangifera Indica Hives    papaya   Papaya Derivatives Hives   Betadine [Povidone Iodine] Hives        Clarithromycin Hives   Other Hives     Mango    Povidone-Iodine Hives   Prednisone Anxiety    High blood pressure, flushed, mood changes, heart palpitations High blood pressure, flushed, mood changes, heart palpitations    Outpatient Encounter  Medications as of 01/27/2021  Medication Sig   ALPRAZolam (XANAX) 1 MG tablet Take 1 mg by mouth at bedtime.    apixaban (ELIQUIS) 5 MG TABS tablet Take 1 tablet (5 mg total) by mouth 2 (two) times daily.   busPIRone (BUSPAR) 7.5 MG tablet Take 7.5 mg by mouth 2 (two) times daily.   CREON 36000-114000 units CPEP capsule Take 2 capsules with the first bite of each meal and 1 capsule with the first bite of each snack   diltiazem (CARDIZEM CD) 120 MG 24 hr capsule Take 1 capsule (120 mg total) by mouth daily.   diltiazem (CARDIZEM) 30 MG tablet Take 1 tablet (30 mg total) by mouth every 6 (six) hours as needed.   linaclotide (LINZESS) 145 MCG CAPS capsule Take 1 capsule (145 mcg total) by mouth daily before breakfast.   Menthol, Topical Analgesic, (BENGAY EX) Apply 1 application topically daily as needed (pain).   montelukast (SINGULAIR) 10 MG tablet TAKE 1 TABLET BY MOUTH EVERY DAY   morphine (MSIR) 15 MG tablet Take 1 tablet (15 mg total) by mouth every 6 (six) hours as needed for moderate pain or severe pain. Must last 30 days.   [START ON 02/15/2021] morphine (MSIR) 15 MG tablet Take 1 tablet (15 mg total) by mouth every 6 (six) hours as needed for moderate pain or severe pain. Must last 30 days.   naphazoline-pheniramine (VISINE) 0.025-0.3 % ophthalmic solution 1 drop 4 (four) times daily as needed for eye irritation.   PANCREATIN PO  Take by mouth. 2 tablets before meals and 1 tablet before snacks.   pantoprazole (PROTONIX) 40 MG tablet TAKE 1 TABLET BY MOUTH TWICE A DAY   polyethylene glycol (MIRALAX / GLYCOLAX) 17 g packet Take 17 g by mouth daily.   sodium chloride (OCEAN) 0.65 % SOLN nasal spray Place 1 spray into both nostrils daily as needed for congestion (Dryness).   No facility-administered encounter medications on file as of 01/27/2021.    Patient Active Problem List   Diagnosis Date Noted   Skin abnormalities 01/18/2021   Epigastric abdominal pain    Intractable nausea and  vomiting    Ileus ([place]) 01/14/2021   Atrial fibrillation with RVR (HCC)    Enteritis    Chest pain    Chronic shoulder pain (Right) 11/30/2020   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   Arthropathy of shoulder (Right) 11/30/2020   C7 radiculopathy (Right) 11/30/2020   Cervical radiculopathy at C8 (Right) 11/30/2020   Muscle weakness of upper extremity (Right) 11/30/2020   Weakness of upper extremity (Right) 11/30/2020   Cervical fusion syndrome 11/30/2020   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   Chronic use of opiate for therapeutic purpose 08/18/2020   Persistent atrial fibrillation ([place]) 06/30/2020   Abnormal MRI, cervical spine (05/12/2020) 06/24/2020   Chronic upper back pain 06/24/2020   Enthesopathy of shoulder (Left) 05/13/2020   Osteoarthritis of shoulder (Left) 05/13/2020   Symptoms referable to shoulder joint 05/13/2020   Tendinopathy of rotator cuff (Left) 05/13/2020   Sprain of supraspinatus muscle or tendon, sequela (Left) 05/13/2020   Subdeltoid bursitis of shoulder (Left) 05/13/2020   Subacromial bursitis of shoulder (Left) 05/13/2020   Chronic shoulder pain (Left) 05/13/2020   Acute postoperative pain 05/13/2020   History of allergy to iodine 05/13/2020    Class: History of   Abnormal MRI, shoulder (03/20/2017) 04/21/2020   Chronic anticoagulation (Eliquis) 04/21/2020   Chronic neck pain with history of cervical spinal surgery 04/21/2020   Multiple allergies 04/21/2020   S/P repair of paraesophageal hernia 02/26/2020   Heartburn    Gastritis without bleeding    Typical atrial flutter (HCC)    Foraminal stenosis of cervical region (C3-4) (Right) 08/26/2019   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) 08/26/2019   DDD (degenerative disc disease), cervical 08/25/2019   Cervical radiculitis (C4,C5,C8) (Left) 08/20/2019   Pure hypercholesterolemia 06/27/2019   Constipation due to opioid therapy 06/27/2019   Snoring 06/27/2019    Unstable angina ([place]) 06/10/2019   Chronic musculoskeletal pain 01/14/2019   Hypertension 12/27/2018   Spondylosis without myelopathy or radiculopathy, lumbosacral region 11/12/2018   History of allergy to radiographic contrast media 11/12/2018   History of allergy to shellfish 11/12/2018   DDD (degenerative disc disease), lumbosacral 07/31/2018   Neuropathic pain 07/31/2018   Neurogenic pain 07/31/2018   Vitamin D deficiency 05/28/2018   History of lumbar surgery 05/28/2018   Groin pain, chronic, left 05/20/2018   Other intervertebral disc degeneration, lumbar region 05/20/2018   Disorder of skeletal system 05/20/2018   Problems influencing health status 05/20/2018   Overweight 12/25/2017   Enthesopathy of hip region (Left) 07/13/2017   Trigger point of shoulder region (Left) 02/26/2017   Pharmacologic therapy    Polyp of sigmoid colon    Problems with swallowing and mastication    Chronic shoulder arthropathy (Left) 01/04/2017   Dysphagia 12/07/2016   Abnormal flushing and sweating 12/07/2016   Long term prescription benzodiazepine use 10/11/2016   Osteoarthritis of hip (Bilateral) (L>R) (  S/P Right THR) 10/11/2016   Chronic hip pain (2ry area of Pain) (Bilateral) (L>R) 10/11/2016   Osteoarthritis of shoulder (Bilateral) (L>R) 05/17/2016   Cervicogenic headache 07/06/2015   History of total hip replacement (THR) (Right) 07/05/2015   Chronic shoulder radicular pain (Bilateral) (L>R) 07/05/2015   Lumbar facet syndrome (Bilateral) (L>R) 07/05/2015   Chronic low back pain (1ry area of Pain) (Bilateral) (L>R) w/o sciatica 04/05/2015   Chronic hip pain (Right) 01/04/2015   Encounter for therapeutic drug level monitoring 01/04/2015   Long term current use of opiate analgesic 01/04/2015   Long term prescription opiate use 01/04/2015   Opiate use 01/04/2015   Chronic pain syndrome 01/04/2015   Steroid intolerance 01/04/2015   Cervical spondylosis 01/04/2015   Cervical facet  arthropathy (Bilateral) 01/04/2015   History of cervical spinal surgery 01/04/2015   Chronic shoulder pain (3ry area of Pain) (Bilateral) (L>R) 01/04/2015   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   Epidural fibrosis 01/04/2015   Lumbar spondylosis 01/04/2015   Cervical facet syndrome (Bilateral) 01/04/2015   Chronic sacroiliac joint pain (Left) 01/04/2015   DOE (dyspnea on exertion) 09/03/2014   Asthma, mild 08/18/2014   Blurred vision 08/18/2014   Benign paroxysmal positional nystagmus 08/18/2014   Clinical depression 08/18/2014   Fatigue 08/18/2014   Cannot sleep 08/18/2014   Palpitations 08/18/2014   RAD (reactive airway disease) 08/18/2014   OSA (obstructive sleep apnea) 08/18/2014   Lightheadedness 04/22/2013   Chronic neck pain 04/16/2013   Anxiety state 08/03/2003   Colon, diverticulosis 07/15/2003   IBS (irritable bowel syndrome) 04/27/2003   Esophagitis, reflux 02/11/2003   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    Patient Care Plan: RN Care Management Plan of Care     Problem Identified: A-Fib, Asthma, HTN, Pancreatic Insuffiency and Falls      Long-Range Goal: Disease Progression Prevented or Minimized   Start Date: 01/25/2021  Expected End Date: 04/25/2021  Priority: High  Note:   Current Barriers:  Chronic Disease Management support and education needs related to Atrial Fibrillation, HTN, Asthma, Pancreatic Insufficiency and Fall Risk  RNCM Clinical Goal(s):  Patient will demonstrate Ongoing adherence to prescribed treatment plan for Atrial Fibrillation, HTN, Asthma, Pancreatic Insufficiency and Fall Risk as evidenced by collaboration with the care management team.  Interventions: 1:1 collaboration with primary care provider regarding development and update of comprehensive plan of care as evidenced by provider attestation and co-signature Inter-disciplinary care team collaboration (see longitudinal plan of  care) Evaluation of current treatment plan related to  self management and patient's adherence to plan as established by provider   Hypertension Interventions: Last practice recorded BP readings:  BP Readings from Last 3 Encounters:  01/18/21 102/80  01/17/21 (!) 110/54  11/30/20 (!) 140/99  Most recent eGFR/CrCl:  Lab Results  Component Value Date   EGFR 91 01/19/2021    No components found for: CRCL  Reviewed medications and compliance with treatment plan. Advised to continue taking medications as prescribed Provided information regarding established blood pressure parameters along with indications for notifying a provider. Encouraged to monitor and record readings.  Discussed compliance with recommended cardiac prudent diet. Encouraged to read nutrition labels and avoid highly processed foods when possible. Discussed complications of uncontrolled blood pressure.  Reviewed s/sx of heart attack, stroke and worsening symptoms that require immediate medical attention.   Pancreatic Insufficiency Updated on 01/27/21: Reviewed treatment plan and medications related to pancreatic insufficiency. Additional collaboration with the [place] [facility] team. Staff indicated medications  orders have been placed for enzyme replacement. Reports patient should be able to obtain the prescription from the pharmacy. She will be able to receive 2 refills.  Discussed plan with [name]. She will obtain the new prescription as instructed. Reviewed instructions regarding administration. Reports not eating three meals daily but will continue taking as advised prior to small meals and snacks. Advised to notify the Gastroenterology team with concerns regarding side effects and medication tolerance. Reviewed worsening symptoms that require immediate medical attention.    Patient Goals/Self-Care Activities: Patient will self administer medications as prescribed Patient will attend all scheduled provider  appointments Patient will call pharmacy for medication refills Patient will continue to perform ADL's independently Patient will continue to perform IADL's independently Patient will call provider office for new concerns or questions   Follow Up Plan:   Will follow up next month      PLAN A member of the care management team will follow up next month.   [facility] [place] [phone]

## 2021-01-27 NOTE — Telephone Encounter (Signed)
Called patient let her know I was sending in her medicine as requested from Dr Marius Ditch and told her how to take it two with first bite each meal and 1 with first bite snack but if it gave her constipation to go to 1 pill first bite each meal and snack patient understands

## 2021-01-28 ENCOUNTER — Telehealth: Payer: Self-pay

## 2021-01-28 DIAGNOSIS — R7989 Other specified abnormal findings of blood chemistry: Secondary | ICD-10-CM

## 2021-01-28 NOTE — Telephone Encounter (Signed)
-----   Message from Virginia Crews, MD sent at 01/20/2021 10:37 AM EST ----- Normal/stable labs, except slight elevation in one liver function test.  Recommend holding alcohol and decreasing Tylenol intake and hydrating well and repeat in 1 month.

## 2021-01-28 NOTE — Telephone Encounter (Signed)
Patient aware. Lab requisition printed and placed up front.

## 2021-02-01 ENCOUNTER — Inpatient Hospital Stay: Payer: Medicare Other | Attending: Internal Medicine | Admitting: Internal Medicine

## 2021-02-01 ENCOUNTER — Inpatient Hospital Stay: Payer: Medicare Other

## 2021-02-01 ENCOUNTER — Encounter: Payer: Self-pay | Admitting: Internal Medicine

## 2021-02-01 ENCOUNTER — Other Ambulatory Visit: Payer: Self-pay

## 2021-02-01 DIAGNOSIS — K2970 Gastritis, unspecified, without bleeding: Secondary | ICD-10-CM | POA: Insufficient documentation

## 2021-02-01 DIAGNOSIS — E611 Iron deficiency: Secondary | ICD-10-CM | POA: Insufficient documentation

## 2021-02-01 DIAGNOSIS — R197 Diarrhea, unspecified: Secondary | ICD-10-CM | POA: Insufficient documentation

## 2021-02-01 DIAGNOSIS — D509 Iron deficiency anemia, unspecified: Secondary | ICD-10-CM | POA: Diagnosis not present

## 2021-02-01 DIAGNOSIS — I4891 Unspecified atrial fibrillation: Secondary | ICD-10-CM | POA: Diagnosis not present

## 2021-02-01 DIAGNOSIS — G8929 Other chronic pain: Secondary | ICD-10-CM | POA: Diagnosis not present

## 2021-02-01 DIAGNOSIS — Z803 Family history of malignant neoplasm of breast: Secondary | ICD-10-CM | POA: Diagnosis not present

## 2021-02-01 DIAGNOSIS — M549 Dorsalgia, unspecified: Secondary | ICD-10-CM | POA: Insufficient documentation

## 2021-02-01 HISTORY — DX: Iron deficiency: E61.1

## 2021-02-01 NOTE — Progress Notes (Signed)
New patient evaluation.   

## 2021-02-01 NOTE — Assessment & Plan Note (Addendum)
#   Anemia- iron deficiency-[NOV 2022] hemoglobin:11. Ferritin-7 .  Poor tolerance to p.o. iron.  # Patient is symptomatic from worsening fatigue-recommend IV iron infusions. Discussed the potential acute infusion reactions with IV iron; which are quite rare.  Patient understands the risk; will proceed with infusions.  Patient has previous infusion without any prior infusion reactions.  #Etiology: I discussed multiple etiologies with the patient including but not limited to malabsorption/blood loss.  Status post EGD/colonoscopy-as noted above.   #Diarrhea-?  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  Thank you,Dr.Bacigalupo for allowing me to participate in the care of your pleasant patient. Please do not hesitate to contact me with questions or concerns in the interim.  # DISPOSITION: # no labs # venofer weekly x4 # follow up in 3 months- MD: labs- cbc/bmp;iron studies/ferritin- possible IV venofer-Dr.B

## 2021-02-01 NOTE — Progress Notes (Signed)
[facility] NOTE  Patient Care Team: Brita Romp Dionne Bucy, MD as PCP - General (Family Medicine) Kate Sable, MD as PCP - Cardiology (Cardiology) Vickie Epley, MD as PCP - Electrophysiology (Cardiology) Dorann Ou Montine Circle, NP as Nurse Practitioner (Psychiatry) Lucilla Lame, MD as Consulting Physician (Gastroenterology) Jules Husbands, MD as Consulting Physician (General Surgery) Milinda Pointer, MD as Referring Physician (Pain Medicine) Pcp, No Vivianne Master, Freddi Che, DMD (Dentistry) Neldon Labella, RN as Case Manager  CHIEF COMPLAINTS/PURPOSE OF CONSULTATION: ANEMIA  HEMATOLOGY HISTORY:  #Iron deficiency ANEMIA EGD-gastritis/ colonoscopy- lymphoplasmacytic infiltrateNovember 2022 [Dr. Marius Ditch; in hospital]; capsule/Bone marrow Biopsy-none; [history of chronic iron deficiency anemia- s/p IV iron infusions in the past]; CTA CAP: Negative for any source of bleeding noted.  Atrophic pancreas-?  Etiology  #Question pancreatic insufficiency-on Creon [nov 8338]  #A. Fib [s/p cardioversion-Eliquis Dr.Agbor]; #Chronic back pain/pain management    Latest Reference Range & Units 01/14/21 06:15  Iron 28 - 170 ug/dL 30  UIBC ug/dL 502  TIBC 250 - 450 ug/dL 532 (H)  Saturation Ratios 10.4 - 31.8 % 6 (L)  Ferritin 11 - 307 ng/mL 7 (L)  Folate >5.9 ng/mL 14.9  (H): Data is abnormally high (L): Data is abnormally low  HISTORY OF PRESENTING ILLNESS:  [name] 60 y.o.  female has been referred to [place] for further evaluation/work-up for anemia.  Patient was recently admitted to hospital with A. fib with RVR-noted to have significant nausea with epigastric abdominal pain.  CT showed ileus.  Patient was subsequently eval by cardiology and also GI.  Underwent EGD/colonoscopy.EGD showed gastritis; colon tubular adenoma.  Blood in stools: None Change in bowel habits- None Blood in urine: None Difficulty swallowing: hx ofmicorscopic hematuria Abnormal weight  loss: None Iron supplementation: PO intolenace to Iron; Hx of IV iron.  Prior Blood transfusions: appx 8 years; post hip surgery Bariatric surgery: None; Hx of Nissen-fundoplication EGD/Colonoscopy: NOV 2022 [Dr.Vanga] Vaginal bleeding: None; Hx of TAH.    Review of Systems  Constitutional:  Positive for malaise/fatigue. Negative for chills, diaphoresis, fever and weight loss.  HENT:  Negative for nosebleeds and sore throat.   Eyes:  Negative for double vision.  Respiratory:  Negative for cough, hemoptysis, sputum production, shortness of breath and wheezing.   Cardiovascular:  Negative for chest pain, palpitations, orthopnea and leg swelling.  Gastrointestinal:  Positive for diarrhea and nausea. Negative for abdominal pain, blood in stool, constipation, heartburn, melena and vomiting.  Genitourinary:  Negative for dysuria, frequency and urgency.  Musculoskeletal:  Negative for back pain and joint pain.  Skin: Negative.  Negative for itching and rash.  Neurological:  Negative for dizziness, tingling, focal weakness, weakness and headaches.  Endo/Heme/Allergies:  Does not bruise/bleed easily.  Psychiatric/Behavioral:  Negative for depression. The patient is not nervous/anxious and does not have insomnia.    MEDICAL HISTORY:  Past Medical History:  Diagnosis Date  . Abnormal EKG    HX OF INVERTED T WAVES ON EKG, PALPITATIONS, CHEST PAINS-CARDIAC WORK UP DID NOT SHOW ANY HEART DISEASE  . AC (acromioclavicular) joint bone spurs   . Acute postoperative pain 01/04/2017  . 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 Anemia    Iron Infusion-8 yrs ago  . Anxiety   . Asthma   . Cephalalgia 08/18/2014  . Cervical disc disease 08/18/2014   Needs neck surgery.   . Chronic headaches   . Chronic, continuous use of opioids   . DDD (degenerative disc disease)    CERVICAL AND LUMBAR-CHRONIC  PAIN, RT HIP LABRAL TEAR  . Depression    PT STATES A LOT OF STRESS IN HER LIFE  . Dissociative disorder   .  Dizziness 04/22/2013  . Duodenal ulcer with hemorrhage and perforation ([place]) 04/27/2003  . Foot drop, right    FROM BACK SURGERY  . GERD (gastroesophageal reflux disease)   . H/O arthrodesis 08/18/2014  . Headache(784.0)    AND NECK PAIN--STATES RECENT TEST SHOW CERVICAL DEGENERATION  . History of blood transfusion    s/p back surgery  . History of cardiac cath    a. 05/2019 Cath: Nl cors. EF 55-65%.  . History of cardioversion   . History of cervical spinal surgery 01/04/2015  . History of kidney stones   . Hypertension   . Inverted T wave   . Mitral regurgitation    a. 07/2020 Echo: EF 60-65%, no rwma. Nl RV size/fxn. RVSP 39.56mHg. Mildly to mod dil LA. Mod MR; b. 07/2020 TEE: EF 55-60%. Lambl's excresence. Nl RV size/fxn. Midly dil LA. No LA/LAA thrombus. Mild MR.  . Narrowing of intervertebral disc space 08/18/2014   Currently on disability.   . Orthostatic hypotension 04/22/2013  . Pain    CHRONIC NECK AND BACK PAIN - LIMITED ROM NECK - S/P FUSIONS CERVICAL AND LUMBAR  . Paroxysmal atrial fibrillation ([place] 2021   a. 07/2020 s/p PVI.  .Marland KitchenPneumonia   . PONV (postoperative nausea and vomiting)    PT GIVES HX OF N&V AND FEVER WITH SURGERIES YEARS AGO--BUT NO PROBLEMS WITH MORE RECENT SURGERIES--STATES NOT MALIGNANT HYPERTHERMIA  . Postop Hyponatremia 05/14/2012  . Postoperative anemia due to acute blood loss 05/14/2012  . PTSD (post-traumatic stress disorder)   . Right foot drop   . 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   . Sleep apnea 2021   does not have a cpap  . Therapeutic opioid-induced constipation ([facility])   . Typical atrial flutter ([place]     SURGICAL HISTORY: Past Surgical History:  Procedure Laterality Date  . ABDOMINAL HYSTERECTOMY    . afib  05/2019  . ANTERIOR CERVICAL DECOMP/DISCECTOMY FUSION N/A 10/30/2012   Procedure: ACDF C5-6, EXPLORATION AND HARDWARE REMOVAL C6-7;  Surgeon: DMelina Schools MD;  [place]   Service: Orthopedics;  Laterality: N/A;  . [facility] ATRIAL FIBRILLATION ABLATION N/A 08/06/2020   Procedure: ATRIAL FIBRILLATION ABLATION;  Surgeon: LVickie Epley MD;  [place];  Service: Cardiovascular;  Laterality: N/A;  . BACK SURGERY  2009   LUMBAR FUSION WITH RODS   . BREAST BIOPSY Right 2008   benign.- bx/clip  . BREAST EXCISIONAL BIOPSY Left 1998   benign  . BREAST REDUCTION SURGERY Bilateral 06/2016  . CARDIAC ELECTROPHYSIOLOGY MAPPING AND ABLATION    . CARDIOVERSION N/A 12/03/2019   Procedure: CARDIOVERSION;  Surgeon: AKate Sable MD;  Location: [facility];  Service: Cardiovascular;  Laterality: N/A;  . CARPAL TUNNEL RELEASE  05-06-12   Right  . CHOLECYSTECTOMY    . COLONOSCOPY WITH PROPOFOL N/A 01/26/2017   Procedure: COLONOSCOPY WITH PROPOFOL;  Surgeon: WLucilla Lame MD;  [place]  Service: Endoscopy;  Laterality: N/A;  . COLONOSCOPY WITH PROPOFOL N/A 01/16/2021   Procedure: COLONOSCOPY WITH PROPOFOL;  Surgeon: VLin Landsman MD;  [facility];  Service: Gastroenterology;  Laterality: N/A;  . DIAGNOSTIC LAPAROSCOPIES - MULTIPLE FOR ENDOMETRIOSIS    . ESOPHAGEAL DILATION  N/A 01/26/2017   Procedure: ESOPHAGEAL DILATION;  Surgeon: Lucilla Lame, MD;  Location: [place];  Service: Endoscopy;  Laterality: N/A;  . ESOPHAGEAL MANOMETRY N/A 05/30/2017   Procedure: ESOPHAGEAL MANOMETRY (EM);  Surgeon: Lucilla Lame, MD;  Location: [facility];  Service: Endoscopy;  Laterality: N/A;  . ESOPHAGOGASTRODUODENOSCOPY (EGD) WITH PROPOFOL N/A 01/26/2017   Procedure: ESOPHAGOGASTRODUODENOSCOPY (EGD) WITH PROPOFOL;  Surgeon: Lucilla Lame, MD;  Location: [place];  Service: Endoscopy;  Laterality: N/A;  . ESOPHAGOGASTRODUODENOSCOPY (EGD) WITH PROPOFOL N/A 01/30/2020   Procedure: ESOPHAGOGASTRODUODENOSCOPY (EGD) WITH PROPOFOL;  Surgeon: Lucilla Lame, MD;  Location:  [place];  Service: Endoscopy;  Laterality: N/A;  sleep apnea COVID + 01-15-20  . ESOPHAGOGASTRODUODENOSCOPY (EGD) WITH PROPOFOL N/A 01/15/2021   Procedure: ESOPHAGOGASTRODUODENOSCOPY (EGD) WITH PROPOFOL;  Surgeon: Lin Landsman, MD;  Location: [place];  Service: Gastroenterology;  Laterality: N/A;  . HIP ARTHROSCOPY  09/20/2011   Procedure: ARTHROSCOPY HIP;  Surgeon: Gearlean Alf, MD;  [facility];  Service: Orthopedics;  Laterality: Right;  Right Hip Scope with Labral Debridement  . LEFT HEART CATH AND CORONARY ANGIOGRAPHY Left 06/10/2019   Procedure: LEFT HEART CATH AND CORONARY ANGIOGRAPHY;  Surgeon: Nelva Bush, MD;  Location: [place] [facility] LAB;  Service: Cardiovascular;  Laterality: Left;  . NASAL SEPTUM SURGERY  MARCH 2013   IN [place]  . [place] IMPEDANCE STUDY N/A 05/30/2017   Procedure: [place] IMPEDANCE STUDY;  Surgeon: Lucilla Lame, MD;  Location: [facility];  Service: Endoscopy;  Laterality: N/A;  . POLYPECTOMY N/A 01/26/2017   Procedure: POLYPECTOMY;  Surgeon: Lucilla Lame, MD;  Location: [place];  Service: Endoscopy;  Laterality: N/A;  . POSTERIOR CERVICAL FUSION/FORAMINOTOMY N/A 04/16/2013   Procedure: REMOVAL CERVICAL PLATES AND INTERBODY CAGE/POSTERIOR CERVICAL SPINAL FUSION C4 - C6/C5 CORPECTOMY/C4 - C6 FUSION WITH ILIAC CREST BONE GRAFT;  Surgeon: Melina Schools, MD;  Location: [place];  Service: Orthopedics;  Laterality: N/A;  . RADIOFREQUENCY ABLATION NERVES    . REDUCTION MAMMAPLASTY Bilateral 05/2016  . RIGHT HIP ARTHROSCOPY FOR LABRAL TEAR  ABOUT 2010   2012 also  . SHOULDER ARTHROSCOPY  05-06-12   bone spur  . TEE WITHOUT CARDIOVERSION N/A 08/05/2020   Procedure: TRANSESOPHAGEAL ECHOCARDIOGRAM (TEE);  Surgeon: Lelon Perla, MD;  [facility];  Service: Cardiovascular;  Laterality: N/A;  . TONSILLECTOMY     AS A CHILD  . TOTAL HIP ARTHROPLASTY Right 05/13/2012   Procedure: TOTAL HIP ARTHROPLASTY ANTERIOR APPROACH;   Surgeon: Gearlean Alf, MD;  [facility];  Service: Orthopedics;  Laterality: Right;    SOCIAL HISTORY: Social History   Socioeconomic History  . Marital status: Married    Spouse name: bruce  . Number of children: 2  . Years of education: Not on file  . Highest education level: Associate degree: occupational, Hotel manager, or vocational program  Occupational History    Comment: disabled  Tobacco Use  . Smoking status: Never  . Smokeless tobacco: Never  Vaping Use  . Vaping Use: Never used  Substance and Sexual Activity  . Alcohol use: No    Alcohol/week: 0.0 standard drinks  . Drug use: Yes    Comment: prescribed morphine and xanax  . Sexual activity: Yes  Other Topics Concern  . Not on file  Social History Narrative   Son, daughter  And 4 grandkids; raises 3 of the grandchildren      Lives in 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  Social Determinants of Health   Financial Resource Strain: Not on file  Food Insecurity: No Food Insecurity  . Worried About Charity fundraiser in the Last Year: Never true  . Ran Out of Food in the Last Year: Never true  Transportation Needs: No Transportation Needs  . Lack of Transportation (Medical): No  . Lack of Transportation (Non-Medical): No  Physical Activity: Not on file  Stress: Not on file  Social Connections: Not on file  Intimate Partner Violence: Not on file    FAMILY HISTORY: Family History  Problem Relation Age of Onset  . Aneurysm Mother   . Bipolar disorder Sister   . Aneurysm Maternal Grandmother   . Breast cancer Paternal Grandmother 44  . Bipolar disorder Grandchild   . Anxiety disorder Grandchild   . Depression Grandchild   . Cancer Neg Hx     ALLERGIES:  is allergic to amoxicillin, chlorhexidine gluconate, clindamycin, codeine, erythromycin, penicillin g, sulfa antibiotics, levofloxacin, shellfish allergy, decadron [dexamethasone], mangifera indica, papaya  derivatives, betadine [povidone iodine], clarithromycin, other, povidone-iodine, and prednisone.  MEDICATIONS:  Current Outpatient Medications  Medication Sig Dispense Refill  . ALPRAZolam (XANAX) 1 MG tablet Take 1 mg by mouth at bedtime.     Marland Kitchen apixaban (ELIQUIS) 5 MG TABS tablet Take 1 tablet (5 mg total) by mouth 2 (two) times daily. 60 tablet 5  . busPIRone (BUSPAR) 7.5 MG tablet Take 7.5 mg by mouth 2 (two) times daily.    Marland Kitchen CREON 36000-114000 units CPEP capsule Take 2 capsules with the first bite of each meal and 1 capsule with the first bite of each snack 720 capsule 2  . diltiazem (CARDIZEM CD) 120 MG 24 hr capsule Take 1 capsule (120 mg total) by mouth daily. 30 capsule 0  . diltiazem (CARDIZEM) 30 MG tablet Take 1 tablet (30 mg total) by mouth every 6 (six) hours as needed. 90 tablet 0  . linaclotide (LINZESS) 145 MCG CAPS capsule Take 1 capsule (145 mcg total) by mouth daily before breakfast. 30 capsule 0  . Menthol, Topical Analgesic, (BENGAY EX) Apply 1 application topically daily as needed (pain).    . montelukast (SINGULAIR) 10 MG tablet TAKE 1 TABLET BY MOUTH EVERY DAY 90 tablet 1  . morphine (MSIR) 15 MG tablet Take 1 tablet (15 mg total) by mouth every 6 (six) hours as needed for moderate pain or severe pain. Must last 30 days. 120 tablet 0  . [START ON 02/15/2021] morphine (MSIR) 15 MG tablet Take 1 tablet (15 mg total) by mouth every 6 (six) hours as needed for moderate pain or severe pain. Must last 30 days. 120 tablet 0  . naphazoline-pheniramine (VISINE) 0.025-0.3 % ophthalmic solution 1 drop 4 (four) times daily as needed for eye irritation.    . pantoprazole (PROTONIX) 40 MG tablet TAKE 1 TABLET BY MOUTH TWICE A DAY 180 tablet 3  . polyethylene glycol (MIRALAX / GLYCOLAX) 17 g packet Take 17 g by mouth daily.    . sodium chloride (OCEAN) 0.65 % SOLN nasal spray Place 1 spray into both nostrils daily as needed for congestion ([place]).    Marland Kitchen PANCREATIN PO Take by mouth. 2  tablets before meals and 1 tablet before snacks.     No current facility-administered medications for this visit.      PHYSICAL EXAMINATION:   Vitals:   02/01/21 1128  BP: 125/87  Pulse: 80  Resp: 16  Temp: 98.3 F (36.8 C)   Filed Weights  02/01/21 1128  Weight: 145 lb 3.2 oz (65.9 kg)  Irregular rhythm.  Physical Exam Vitals and nursing note reviewed.  HENT:     Head: Normocephalic and atraumatic.     Mouth/Throat:     Pharynx: Oropharynx is clear.  Eyes:     Extraocular Movements: Extraocular movements intact.     Pupils: Pupils are equal, round, and reactive to light.  Cardiovascular:     Rate and Rhythm: Normal rate.  Pulmonary:     Comments: Decreased breath sounds bilaterally.  Abdominal:     Palpations: Abdomen is soft.  Musculoskeletal:        General: Normal range of motion.     Cervical back: Normal range of motion.  Skin:    General: Skin is warm.  Neurological:     General: No focal deficit present.     Mental Status: She is alert and oriented to person, place, and time.  Psychiatric:        Behavior: Behavior normal.        Judgment: Judgment normal.    LABORATORY DATA:  I have reviewed the data as listed Lab Results  Component Value Date   WBC 7.4 01/14/2021   HGB 11.1 (L) 01/14/2021   HCT 33.8 (L) 01/14/2021   MCV 81.6 01/14/2021   PLT 313 01/14/2021   Recent Labs    05/01/20 1720 07/22/20 1449 01/13/21 2014 01/14/21 0615 01/19/21 1055  NA 133* 137 138 136 140  K 3.5 3.7 2.9* 4.3 4.2  CL 100 105 102 104 105  CO2 23 23 26 24 22   GLUCOSE 162* 106* 144* 146* 93  BUN 12 9 12 12  7*  CREATININE 1.01* 0.74 1.07* 0.85 0.75  CALCIUM 9.5 8.9 9.1 8.9 8.9  GFRNONAA >60 >60 59* >60  --   PROT 7.8  --  7.9  --  6.2  ALBUMIN 4.5  --  4.3  --  3.9  AST 20  --  21  --  18  ALT 13  --  11  --  16  ALKPHOS 98  --  108  --  135*  BILITOT 0.6  --  0.8  --  0.3     DG Abdomen 1 View  Result Date: 01/14/2021 CLINICAL DATA:  60 year old  female NG tube placement. EXAM: ABDOMEN - 1 VIEW COMPARISON:  CT Abdomen and Pelvis 0221 hours today. FINDINGS: AP upright view at 0447 hours. Enteric feeding tube placed into the stomach, tip at the level of the mid gastric body. Side hole is at the level of the fundus. Prior fundoplication seen by CT today. Stable cholecystectomy clips. Extensive thoracolumbar spinal fusion hardware again noted. Stable bowel gas pattern. No pneumoperitoneum. Negative lung bases. IMPRESSION: Enteric feeding tube placed into the stomach, side hole at the level of the fundus. Electronically Signed   By: Genevie Ann M.D.   On: 01/14/2021 05:31   DG Chest Portable 1 View  Result Date: 01/13/2021 CLINICAL DATA:  Palpitations and chest pain. EXAM: PORTABLE CHEST 1 VIEW COMPARISON:  February 27, 2020 FINDINGS: The heart size and mediastinal contours are within normal limits. Both lungs are clear. Stable postoperative changes are seen within the mid to lower cervical spine, lower thoracic spine and upper lumbar spine. IMPRESSION: Stable exam without acute cardiopulmonary disease. Electronically Signed   By: Virgina Norfolk M.D.   On: 01/13/2021 20:33   DG Abd 2 Views  Result Date: 01/14/2021 CLINICAL DATA:  60 year old female with ileus. EXAM: ABDOMEN -  2 VIEW COMPARISON:  Earlier the same day FINDINGS: Slight interval retraction of indwelling gastric decompression tube, now with the proximal side hole just proximal to the gastroesophageal junction in the tip in the gastric fundus. Similar appearing nonspecific bowel gas pattern. Cholecystectomy clips in right upper quadrant. Similar appearing thoracolumbar fusion hardware and partially visualized right total hip arthroplasty. IMPRESSION: 1. Slight interval retraction of the indwelling gastric decompression tube with the proximal side hole now proximal to the gastroesophageal junction. 2. Similar appearing nonspecific bowel gas pattern. Electronically Signed   By: Ruthann Cancer M.D.    On: 01/14/2021 10:16   DG ABD ACUTE 2+V W 1V CHEST  Result Date: 01/15/2021 CLINICAL DATA:  Tachycardia. Shortness of breath. Nausea. Previous ablation for atrial fibrillation. EXAM: DG ABDOMEN ACUTE WITH 1 VIEW CHEST COMPARISON:  01/14/2021 and 01/13/2021 FINDINGS: There is no evidence of dilated bowel loops or free intraperitoneal air. No radiopaque calculi or other significant radiographic abnormality is seen. Surgical clips again seen from prior cholecystectomy. Thoracolumbar spine fusion hardware and right hip prosthesis again noted. Heart size and mediastinal contours are within normal limits. Both lungs are clear. Cervical spine fusion hardware is also noted. IMPRESSION: Unremarkable bowel gas pattern.  No acute findings. No active cardiopulmonary disease. Electronically Signed   By: Marlaine Hind M.D.   On: 01/15/2021 08:53   CT Angio Chest/Abd/Pel for Dissection W and/or Wo Contrast  Result Date: 01/14/2021 CLINICAL DATA:  Prior Nissen fundoplication with patulous esophagus with fluid retention in the distal esophagus. EXAM: CT ANGIOGRAPHY CHEST, ABDOMEN AND PELVIS TECHNIQUE: Non-contrast CT of the chest was initially obtained. Multidetector CT imaging through the chest, abdomen and pelvis was performed using the standard protocol during bolus administration of intravenous contrast. Multiplanar reconstructed images and MIPs were obtained and reviewed to evaluate the vascular anatomy. CONTRAST:  60m OMNIPAQUE IOHEXOL 350 MG/ML SOLN COMPARISON:  Noncontrast abdomen and pelvis CTs 05/01/2020 and 08/28/2016. No prior chest CT for comparison FINDINGS: CTA CHEST FINDINGS Cardiovascular: There is mild panchamber cardiomegaly but with a slight right chamber predominance and bowing of the interventricular septum towards the left which may be seen with right heart strain. There is distension of the superior pulmonary veins as well. There is no pericardial effusion, no pulmonary arterial dilatation or central  embolus. There are no appreciable coronary artery calcifications. No aortic aneurysm or dissection is seen and no significant visible plaques with mild descending tortuosity. The great vessels are normal. Mediastinum/Nodes: No enlarged mediastinal, hilar, or axillary lymph nodes. Thyroid gland, trachea, demonstrate no significant findings. There is a mildly patulous esophagus with scattered fluid in the distal esophagus and a small hiatus hernia. Lungs/Pleura: The lungs are clear of infiltrates. There are scattered linear scar-like opacities in both bases. There is no pleural effusion, thickening or pneumothorax. Central airways in the trachea are clear. Musculoskeletal: There is fusion plating partially visible in the lower cervical spine. Multilevel Harrington rod fusion begins at T10. There is no worrisome regional skeletal lesion. Review of the MIP images confirms the above findings. CTA ABDOMEN AND PELVIS FINDINGS VASCULAR Aorta: Normal caliber aorta without aneurysm, dissection, vasculitis or significant stenosis. There are scattered trace wall calcifications. Celiac: Patent without evidence of aneurysm, dissection, vasculitis or significant stenosis. SMA: Patent without evidence of aneurysm, dissection, vasculitis or significant stenosis. Renals: There are bilateral single renal arteries and neither demonstrate significant plaques or stenosis. IMA: Normal. Inflow: Common iliac, both internal and external iliac arteries are clear. Veins: The SVC is well opacified but  the IVC and iliac veins are unopacified at the time of imaging. The portal vein is fairly well opacified and normal caliber. Review of the MIP images confirms the above findings. NON-VASCULAR: Abdominal viscera not optimally visualized due to extensive metal artifact from multilevel posterior rod fusion, T10-L4. Hepatobiliary: No obvious mass enhancement in the liver. Gallbladder is absent and the common bile duct is dilated measuring 12 mm  without a visible obvious filling defect Pancreas: Partially atrophic, otherwise grossly unremarkable. Spleen: No enlargement or mass enhancement. Adrenals/Urinary Tract: There is no adrenal mass. Asymmetric right renal cortical volume loss and upper pole cortical scarring are again shown. There is no renal mass, calculus or hydronephrosis Stomach/Bowel: Small hiatal hernia and evidence of Nissen fundoplication. Mucosal thickening in the jejunal segments in the left abdomen is noted. There is mild dilatation of the left lower quadrant small bowel to up to 2.7 cm and relatively decompressed pelvic and right abdominal small bowel but no visible transitional segment. The appendix is normal caliber. There is moderate to severe stool retention in the ascending colon. Left colonic diverticula without evidence of diverticulitis Lymphatic: No adenopathy is seen through the metallic artifact, additional metal artifact of the pelvis from right hip arthroplasty. Reproductive: The uterus is absent. The bladder is partially obscured to the right but normal where visible. Other: There is no free air, hemorrhage or fluid Musculoskeletal: T10-L4 posterior fusion and posterior bone grafting mantle, with discectomies and interbody bone plugs at L2-3 and L3-4 and with multilevel laminectomies. Right hip arthroplasty. There is osteopenia without evidence of acute skeletal abnormality. Review of the MIP images confirms the above findings. IMPRESSION: 1. No aortic aneurysm or dissection and only trace calcifications in the abdominal segment. 2. There is no significant visceral artery stenosis, but the right kidney is noted with asymmetric chronic volume loss and upper pole scarring as before. 3. Mild cardiomegaly with a right chamber predominance and a slight bowing of the interventricular septum to the left. This may be seen with right heart strain and there are prominent superior pulmonary veins as well, but no findings of pulmonary  edema or pleural effusions. 4. Prior Nissen procedure with small hiatal hernia and patulous esophagus with fluid retention distally. 5. Nonspecific jejunal enteritis or enteropathy, and in the left lower abdomen mildly dilated small bowel is seen which could be due to an ileus or low-grade obstruction. There is no visible transitional segment. Constipation. 6. Prior cholecystectomy with a prominent common bile duct. Laboratory and clinical correlation advised. 7. Posterior fusion and bone grafting T10-L4 with discectomies and interbody bone plugs at L2-3 and L3-4, appears chronic. 8. Additional findings described above. Electronically Signed   By: Telford Nab M.D.   On: 01/14/2021 03:06    Iron deficiency # Anemia- iron deficiency-[NOV 2022] hemoglobin:11. Ferritin-7 .  Poor tolerance to p.o. iron.  # Patient is symptomatic from worsening fatigue-recommend IV iron infusions. Discussed the potential acute infusion reactions with IV iron; which are quite rare.  Patient understands the risk; will proceed with infusions.  Patient has previous infusion without any prior infusion reactions.  #Etiology: I discussed multiple etiologies with the patient including but not limited to malabsorption/blood loss.  Status post EGD/colonoscopy-as noted above.   #Diarrhea-?  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  Thank you,Dr.Bacigalupo for allowing me to participate in the care of your pleasant patient. Please do not hesitate to contact me with questions or concerns in the  interim.  # DISPOSITION: # no labs # venofer weekly x4 # follow up in 3 months- MD: labs- cbc/bmp;iron studies/ferritin- possible IV venofer-Dr.B  All questions were answered. The patient knows to call the clinic with any problems, questions or concerns.    Cammie Sickle, MD 02/01/2021 12:26 PM

## 2021-02-07 ENCOUNTER — Telehealth: Payer: Self-pay

## 2021-02-07 NOTE — Telephone Encounter (Signed)
Copied from [place] [id number]. Topic: General - Other >> Feb 04, 2021  3:02 PM [name] wrote: Reason for CRM: Pt called stating that she would like to have her AWV scheduled for 02/08/21 as a telephone visit. Please advise.

## 2021-02-08 ENCOUNTER — Other Ambulatory Visit: Payer: Self-pay

## 2021-02-08 ENCOUNTER — Ambulatory Visit (INDEPENDENT_AMBULATORY_CARE_PROVIDER_SITE_OTHER): Payer: Medicare Other

## 2021-02-08 VITALS — BP 128/72 | HR 71 | Temp 98.0°F | Ht 64.0 in | Wt 147.9 lb

## 2021-02-08 DIAGNOSIS — R319 Hematuria, unspecified: Secondary | ICD-10-CM

## 2021-02-08 DIAGNOSIS — Z0000 Encounter for general adult medical examination without abnormal findings: Secondary | ICD-10-CM

## 2021-02-08 NOTE — Patient Instructions (Signed)
[name] , Thank you for taking time to come for your Medicare Wellness Visit. I appreciate your ongoing commitment to your health goals. Please review the following plan we discussed and let me know if I can assist you in the future.   Screening recommendations/referrals: Colonoscopy: 01/16/21 Mammogram: 04/22/20 Bone Density: no Recommended yearly ophthalmology/optometry visit for glaucoma screening and checkup Recommended yearly dental visit for hygiene and checkup  Vaccinations: Influenza vaccine: 01/16/21 Pneumococcal vaccine: 12/30/15 Tdap vaccine: n/d Shingles vaccine: n/c  Covid-19: 06/06/19, 07/04/19, 12/22/19  Advanced directives: no  Conditions/risks identified:   Next appointment: Follow up in one year for your annual wellness visit.   Preventive Care 40-64 Years, Female Preventive care refers to lifestyle choices and visits with your health care provider that can promote health and wellness. What does preventive care include? A yearly physical exam. This is also called an annual well check. Dental exams once or twice a year. Routine eye exams. Ask your health care provider how often you should have your eyes checked. Personal lifestyle choices, including: Daily care of your teeth and gums. Regular physical activity. Eating a healthy diet. Avoiding tobacco and drug use. Limiting alcohol use. Practicing safe sex. Taking low-dose aspirin daily starting at age 7. Taking vitamin and mineral supplements as recommended by your health care provider. What happens during an annual well check? The services and screenings done by your health care provider during your annual well check will depend on your age, overall health, lifestyle risk factors, and family history of disease. Counseling  Your health care provider may ask you questions about your: Alcohol use. Tobacco use. Drug use. Emotional well-being. Home and relationship well-being. Sexual activity. Eating  habits. Work and work Statistician. Method of birth control. Menstrual cycle. Pregnancy history. Screening  You may have the following tests or measurements: Height, weight, and BMI. Blood pressure. Lipid and cholesterol levels. These may be checked every 5 years, or more frequently if you are over 62 years old. Skin check. Lung cancer screening. You may have this screening every year starting at age 9 if you have a 30-pack-year history of smoking and currently smoke or have quit within the past 15 years. Fecal occult blood test (FOBT) of the stool. You may have this test every year starting at age 39. Flexible sigmoidoscopy or colonoscopy. You may have a sigmoidoscopy every 5 years or a colonoscopy every 10 years starting at age 76. Hepatitis C blood test. Hepatitis B blood test. Sexually transmitted disease (STD) testing. Diabetes screening. This is done by checking your blood sugar (glucose) after you have not eaten for a while (fasting). You may have this done every 1-3 years. Mammogram. This may be done every 1-2 years. Talk to your health care provider about when you should start having regular mammograms. This may depend on whether you have a family history of breast cancer. BRCA-related cancer screening. This may be done if you have a family history of breast, ovarian, tubal, or peritoneal cancers. Pelvic exam and Pap test. This may be done every 3 years starting at age 67. Starting at age 29, this may be done every 5 years if you have a Pap test in combination with an HPV test. Bone density scan. This is done to screen for osteoporosis. You may have this scan if you are at high risk for osteoporosis. Discuss your test results, treatment options, and if necessary, the need for more tests with your health care provider. Vaccines  Your health care provider  may recommend certain vaccines, such as: Influenza vaccine. This is recommended every year. Tetanus, diphtheria, and acellular  pertussis (Tdap, Td) vaccine. You may need a Td booster every 10 years. Zoster vaccine. You may need this after age 2. Pneumococcal 13-valent conjugate (PCV13) vaccine. You may need this if you have certain conditions and were not previously vaccinated. Pneumococcal polysaccharide (PPSV23) vaccine. You may need one or two doses if you smoke cigarettes or if you have certain conditions. Talk to your health care provider about which screenings and vaccines you need and how often you need them. This information is not intended to replace advice given to you by your health care provider. Make sure you discuss any questions you have with your health care provider. Document Released: 03/26/2015 Document Revised: 11/17/2015 Document Reviewed: 12/29/2014 Elsevier Interactive Patient Education  2017 [employer] Prevention in the Home Falls can cause injuries. They can happen to people of all ages. There are many things you can do to make your home safe and to help prevent falls. What can I do on the outside of my home? Regularly fix the edges of walkways and driveways and fix any cracks. Remove anything that might make you trip as you walk through a door, such as a raised step or threshold. Trim any bushes or trees on the path to your home. Use bright outdoor lighting. Clear any walking paths of anything that might make someone trip, such as rocks or tools. Regularly check to see if handrails are loose or broken. Make sure that both sides of any steps have handrails. Any raised decks and porches should have guardrails on the edges. Have any leaves, snow, or ice cleared regularly. Use sand or salt on walking paths during winter. Clean up any spills in your garage right away. This includes oil or grease spills. What can I do in the bathroom? Use night lights. Install grab bars by the toilet and in the tub and shower. Do not use towel bars as grab bars. Use non-skid mats or decals in the  tub or shower. If you need to sit down in the shower, use a plastic, non-slip stool. Keep the floor dry. Clean up any water that spills on the floor as soon as it happens. Remove soap buildup in the tub or shower regularly. Attach bath mats securely with double-sided non-slip rug tape. Do not have throw rugs and other things on the floor that can make you trip. What can I do in the bedroom? Use night lights. Make sure that you have a light by your bed that is easy to reach. Do not use any sheets or blankets that are too big for your bed. They should not hang down onto the floor. Have a firm chair that has side arms. You can use this for support while you get dressed. Do not have throw rugs and other things on the floor that can make you trip. What can I do in the kitchen? Clean up any spills right away. Avoid walking on wet floors. Keep items that you use a lot in easy-to-reach places. If you need to reach something above you, use a strong step stool that has a grab bar. Keep electrical cords out of the way. Do not use floor polish or wax that makes floors slippery. If you must use wax, use non-skid floor wax. Do not have throw rugs and other things on the floor that can make you trip. What can I do with  my stairs? Do not leave any items on the stairs. Make sure that there are handrails on both sides of the stairs and use them. Fix handrails that are broken or loose. Make sure that handrails are as long as the stairways. Check any carpeting to make sure that it is firmly attached to the stairs. Fix any carpet that is loose or worn. Avoid having throw rugs at the top or bottom of the stairs. If you do have throw rugs, attach them to the floor with carpet tape. Make sure that you have a light switch at the top of the stairs and the bottom of the stairs. If you do not have them, ask someone to add them for you. What else can I do to help prevent falls? Wear shoes that: Do not have high  heels. Have rubber bottoms. Are comfortable and fit you well. Are closed at the toe. Do not wear sandals. If you use a stepladder: Make sure that it is fully opened. Do not climb a closed stepladder. Make sure that both sides of the stepladder are locked into place. Ask someone to hold it for you, if possible. Clearly mark and make sure that you can see: Any grab bars or handrails. First and last steps. Where the edge of each step is. Use tools that help you move around (mobility aids) if they are needed. These include: Canes. Walkers. Scooters. Crutches. Turn on the lights when you go into a dark area. Replace any light bulbs as soon as they burn out. Set up your furniture so you have a clear path. Avoid moving your furniture around. If any of your floors are uneven, fix them. If there are any pets around you, be aware of where they are. Review your medicines with your doctor. Some medicines can make you feel dizzy. This can increase your chance of falling. Ask your doctor what other things that you can do to help prevent falls. This information is not intended to replace advice given to you by your health care provider. Make sure you discuss any questions you have with your health care provider. Document Released: 12/24/2008 Document Revised: 08/05/2015 Document Reviewed: 04/03/2014 Elsevier Interactive Patient Education  2017 [employer].

## 2021-02-08 NOTE — Progress Notes (Signed)
Subjective:   [name] is a 60 y.o. female who presents for Medicare Annual (Subsequent) preventive examination.  Review of Systems           Objective:    Today's Vitals   02/08/21 0943 02/08/21 0948  BP: 128/72   Pulse: 71   Temp: 98 F (36.7 C)   TempSrc: Oral   SpO2: 100%   Weight: 147 lb 14.4 oz (67.1 kg)   Height: 5\' 4"  (1.626 m)   PainSc:  5    Body mass index is 25.39 kg/m.  Advanced Directives 02/08/2021 02/01/2021 01/15/2021 01/15/2021 08/18/2020 08/06/2020 08/05/2020  Does Patient Have a Medical Advance Directive? No No - No No No No  Type of Advance Directive - - - - - - -  Does patient want to make changes to medical advance directive? - - - - - - -  Copy of [place] in Chart? - - - - - - -  Would patient like information on creating a medical advance directive? No - Patient declined No - Patient declined No - Patient declined - No - Patient declined No - Patient declined No - Patient declined  Pre-existing out of facility DNR order (yellow form or pink MOST form) - - - - - - -    Current Medications (verified) Outpatient Encounter Medications as of 02/08/2021  Medication Sig   ALPRAZolam (XANAX) 1 MG tablet Take 1 mg by mouth at bedtime.    apixaban (ELIQUIS) 5 MG TABS tablet Take 1 tablet (5 mg total) by mouth 2 (two) times daily.   busPIRone (BUSPAR) 7.5 MG tablet Take 7.5 mg by mouth 2 (two) times daily.   CREON 36000-114000 units CPEP capsule Take 2 capsules with the first bite of each meal and 1 capsule with the first bite of each snack   diltiazem (CARDIZEM CD) 120 MG 24 hr capsule Take 1 capsule (120 mg total) by mouth daily.   diltiazem (CARDIZEM) 30 MG tablet Take 1 tablet (30 mg total) by mouth every 6 (six) hours as needed.   linaclotide (LINZESS) 145 MCG CAPS capsule Take 1 capsule (145 mcg total) by mouth daily before breakfast.   montelukast (SINGULAIR) 10 MG tablet TAKE 1 TABLET BY MOUTH EVERY DAY   morphine (MSIR) 15  MG tablet Take 1 tablet (15 mg total) by mouth every 6 (six) hours as needed for moderate pain or severe pain. Must last 30 days.   [START ON 02/15/2021] morphine (MSIR) 15 MG tablet Take 1 tablet (15 mg total) by mouth every 6 (six) hours as needed for moderate pain or severe pain. Must last 30 days.   naphazoline-pheniramine (VISINE) 0.025-0.3 % ophthalmic solution 1 drop 4 (four) times daily as needed for eye irritation.   PANCREATIN PO Take by mouth. 2 tablets before meals and 1 tablet before snacks.   pantoprazole (PROTONIX) 40 MG tablet TAKE 1 TABLET BY MOUTH TWICE A DAY   polyethylene glycol (MIRALAX / GLYCOLAX) 17 g packet Take 17 g by mouth daily.   sodium chloride (OCEAN) 0.65 % SOLN nasal spray Place 1 spray into both nostrils daily as needed for congestion (Dryness).   Menthol, Topical Analgesic, (BENGAY EX) Apply 1 application topically daily as needed (pain). (Patient not taking: Reported on 02/08/2021)   No facility-administered encounter medications on file as of 02/08/2021.    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    Chronic headaches    Chronic, continuous use of opioids    DDD (degenerative disc disease)    CERVICAL AND LUMBAR-CHRONIC PAIN, RT HIP LABRAL TEAR   Depression    PT STATES A  LOT OF STRESS IN HER LIFE   Dissociative disorder    Dizziness 04/22/2013   Duodenal ulcer with hemorrhage and perforation ([place]) 04/27/2003   Foot drop, right    FROM BACK SURGERY   GERD (gastroesophageal reflux disease)    H/O arthrodesis 08/18/2014   Headache(784.0)    AND NECK PAIN--STATES RECENT TEST SHOW CERVICAL DEGENERATION   History of blood transfusion    s/p back surgery   History of cardiac cath    a. 05/2019 Cath: Nl cors. EF 55-65%.   History of cardioversion    History of cervical spinal surgery 01/04/2015   History of kidney stones    Hypertension    Inverted T wave    Mitral regurgitation    a. 07/2020 Echo: EF 60-65%, no rwma. Nl RV size/fxn. RVSP 39.65mmHg. Mildly to mod dil LA. Mod MR; b. 07/2020 TEE: EF 55-60%. Lambl's excresence. Nl RV size/fxn. Midly dil LA. No LA/LAA thrombus. Mild MR.   Narrowing of intervertebral disc space 08/18/2014   Currently on disability.    Orthostatic hypotension 04/22/2013   Pain    CHRONIC NECK AND BACK PAIN - LIMITED ROM NECK - S/P FUSIONS CERVICAL AND LUMBAR   Paroxysmal atrial fibrillation ([place]) 2021   a. 07/2020 s/p PVI.   Pneumonia    PONV (postoperative nausea and vomiting)    PT GIVES HX OF N&V AND FEVER WITH SURGERIES YEARS AGO--BUT NO PROBLEMS WITH MORE RECENT SURGERIES--STATES NOT MALIGNANT HYPERTHERMIA   Postop Hyponatremia 05/14/2012   Postoperative anemia due to acute blood loss 05/14/2012   PTSD (post-traumatic stress disorder)    Right foot drop    Right hip arthralgia 08/18/2014   Status post surgery of right, and now needs left.    Sleep apnea 2021   does not have a cpap   Therapeutic opioid-induced constipation (OIC)    Typical atrial flutter [facility])    Past Surgical History:  Procedure Laterality Date   ABDOMINAL HYSTERECTOMY     afib  05/2019   ANTERIOR CERVICAL DECOMP/DISCECTOMY FUSION N/A 10/30/2012   Procedure: ACDF C5-6, EXPLORATION AND HARDWARE REMOVAL C6-7;  Surgeon: Melina Schools, MD;  Location: [facility];  Service: Orthopedics;  Laterality: N/A;   ANTERIOR FUSION CERVICAL SPINE  MAY 2012   AT [place] N/A 08/06/2020   Procedure: ATRIAL FIBRILLATION ABLATION;  Surgeon: Vickie Epley, MD;  Location: [place] [facility];  Service: Cardiovascular;  Laterality: N/A;   BACK SURGERY  2009   LUMBAR FUSION WITH RODS    BREAST BIOPSY Right 2008   benign.- bx/clip   BREAST EXCISIONAL BIOPSY Left 1998   benign   BREAST REDUCTION SURGERY Bilateral 06/2016  CARDIAC ELECTROPHYSIOLOGY MAPPING AND ABLATION     CARDIOVERSION N/A 12/03/2019   Procedure: CARDIOVERSION;  Surgeon: Kate Sable, MD;  Location: [facility];  Service: Cardiovascular;  Laterality: N/A;   CARPAL TUNNEL RELEASE  05-06-12   Right   CHOLECYSTECTOMY     COLONOSCOPY WITH PROPOFOL N/A 01/26/2017   Procedure: COLONOSCOPY WITH PROPOFOL;  Surgeon: Lucilla Lame, MD;  Location: [place];  Service: Endoscopy;  Laterality: N/A;   COLONOSCOPY WITH PROPOFOL N/A 01/16/2021   Procedure: COLONOSCOPY WITH PROPOFOL;  Surgeon: Lin Landsman, MD;  Location: [facility] ENDOSCOPY;  Service: Gastroenterology;  Laterality: N/A;   DIAGNOSTIC LAPAROSCOPIES - MULTIPLE FOR ENDOMETRIOSIS     ESOPHAGEAL DILATION N/A 01/26/2017   Procedure: ESOPHAGEAL DILATION;  Surgeon: Lucilla Lame, MD;  Location: [place];  Service: Endoscopy;  Laterality: N/A;   ESOPHAGEAL MANOMETRY N/A 05/30/2017   Procedure: ESOPHAGEAL MANOMETRY (EM);  Surgeon: Lucilla Lame, MD;  Location: [facility] ENDOSCOPY;  Service: Endoscopy;  Laterality: N/A;   ESOPHAGOGASTRODUODENOSCOPY (EGD) WITH PROPOFOL N/A 01/26/2017   Procedure: ESOPHAGOGASTRODUODENOSCOPY (EGD) WITH PROPOFOL;  Surgeon: Lucilla Lame, MD;  Location: [place];  Service: Endoscopy;  Laterality: N/A;   ESOPHAGOGASTRODUODENOSCOPY (EGD) WITH PROPOFOL N/A 01/30/2020   Procedure: ESOPHAGOGASTRODUODENOSCOPY (EGD) WITH PROPOFOL;  Surgeon: Lucilla Lame, MD;  Location: [place];  Service: Endoscopy;  Laterality: N/A;  sleep apnea COVID + 01-15-20   ESOPHAGOGASTRODUODENOSCOPY (EGD) WITH PROPOFOL N/A 01/15/2021   Procedure: ESOPHAGOGASTRODUODENOSCOPY (EGD) WITH PROPOFOL;  Surgeon: Lin Landsman, MD;  Location: [place];  Service: Gastroenterology;  Laterality: N/A;   HIP ARTHROSCOPY  09/20/2011   Procedure: ARTHROSCOPY HIP;  Surgeon: Gearlean Alf, MD;  Location: [facility];  Service: Orthopedics;  Laterality: Right;  Right Hip Scope with Labral Debridement   LEFT HEART CATH AND CORONARY ANGIOGRAPHY Left 06/10/2019   Procedure: LEFT HEART CATH AND CORONARY ANGIOGRAPHY;  Surgeon: Nelva Bush, MD;  Location: [place] [facility];  Service: Cardiovascular;  Laterality: Left;   NASAL SEPTUM SURGERY  MARCH 2013   IN [place]   [facility] IMPEDANCE STUDY N/A 05/30/2017   Procedure: [place] IMPEDANCE STUDY;  Surgeon: Lucilla Lame, MD;  Location: [facility] ENDOSCOPY;  Service: Endoscopy;  Laterality: N/A;   POLYPECTOMY N/A 01/26/2017   Procedure: POLYPECTOMY;  Surgeon: Lucilla Lame, MD;  Location: [place];  Service: Endoscopy;  Laterality: N/A;   POSTERIOR CERVICAL FUSION/FORAMINOTOMY N/A 04/16/2013   Procedure: REMOVAL CERVICAL PLATES AND INTERBODY CAGE/POSTERIOR CERVICAL SPINAL FUSION C4 - C6/C5 CORPECTOMY/C4 - C6 FUSION WITH ILIAC CREST BONE GRAFT;  Surgeon: Melina Schools, MD;  Location: [place];  Service: Orthopedics;  Laterality: N/A;   RADIOFREQUENCY ABLATION NERVES     REDUCTION MAMMAPLASTY Bilateral 05/2016   RIGHT HIP ARTHROSCOPY FOR LABRAL TEAR  ABOUT 2010   2012 also   SHOULDER ARTHROSCOPY  05-06-12   bone spur   TEE WITHOUT CARDIOVERSION N/A 08/05/2020   Procedure: TRANSESOPHAGEAL ECHOCARDIOGRAM (TEE);  Surgeon: Lelon Perla, MD;  Location[facility] ENDOSCOPY;  Service: Cardiovascular;  Laterality: N/A;   TONSILLECTOMY     AS A CHILD   TOTAL HIP ARTHROPLASTY Right 05/13/2012   Procedure: TOTAL HIP ARTHROPLASTY ANTERIOR APPROACH;  Surgeon: Gearlean Alf, MD;  Location: [facility];  Service: Orthopedics;  Laterality: Right;   Family History  Problem Relation Age of Onset   Aneurysm Mother    Bipolar disorder Sister    Aneurysm Maternal Grandmother    Breast cancer Paternal Grandmother 67   Bipolar disorder Grandchild  Anxiety disorder Grandchild    Depression Grandchild    Cancer Neg Hx    Social History   Socioeconomic History   Marital status: Married    Spouse name: bruce   Number of children: 2   Years of education: Not on file   Highest education level: Associate degree: occupational, Hotel manager, or vocational program  Occupational History    Comment: disabled  Tobacco Use   Smoking status: Never   Smokeless tobacco: Never  Vaping Use   Vaping Use: Never used  Substance and Sexual Activity   Alcohol use: No    Alcohol/week: 0.0 standard drinks   Drug use: Yes    Comment: prescribed morphine and xanax   Sexual activity: Yes  Other Topics Concern   Not on file  Social History Narrative   Son, daughter  And 4 grandkids; raises 3 of the grandchildren      Lives in [place]; with husband; never smoked; no alcohol; 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    Social Determinants of Health   Financial Resource Strain: Low Risk    Difficulty of Paying Living Expenses: Not hard at all  Food Insecurity: No Food Insecurity   Worried About Charity fundraiser in the Last Year: Never true   [place] in the Last Year: Never true  Transportation Needs: No Transportation Needs   Lack of Transportation (Medical): No   Lack of Transportation (Non-Medical): No  Physical Activity: Insufficiently Active   Days of Exercise per Week: 2 days   Minutes of Exercise per Session: 20 min  Stress: No Stress Concern Present   Feeling of Stress : Only a little  Social Connections: Moderately Integrated   Frequency of Communication with Friends and Family: More than three times a week   Frequency of Social Gatherings with Friends and Family:  Never   Attends Religious Services: 1 to 4 times per year   Active Member of [employer] or Organizations: No   Attends Music therapist: Never   Marital Status: Married    Tobacco Counseling Counseling given: Not Answered   Clinical Intake:  Pre-visit preparation completed: Yes  Pain Score: 5      Nutritional Status: BMI 25 -29 Overweight Nutritional Risks: None Diabetes: No  How often do you need to have someone help you when you read instructions, pamphlets, or other written materials from your doctor or pharmacy?: 1 - Never    Interpreter Needed?: No  Information entered by :: Kirke Shaggy, LPN   Activities of Daily Living In your present state of health, do you have any difficulty performing the following activities: 01/18/2021 01/15/2021  Hearing? N -  Vision? N -  Difficulty concentrating or making decisions? N -  Walking or climbing stairs? Y -  Dressing or bathing? N -  Doing errands, shopping? N N  Some recent data might be hidden    Patient Care Team: Virginia Crews, MD as PCP - General (Family Medicine) Kate Sable, MD as PCP - Cardiology (Cardiology) Vickie Epley, MD as PCP - Electrophysiology (Cardiology) Dorann Ou Montine Circle, NP as Nurse Practitioner (Psychiatry) Lucilla Lame, MD as Consulting Physician (Gastroenterology) Jules Husbands, MD as Consulting Physician (General Surgery) Milinda Pointer, MD as Referring Physician (Pain Medicine) Pcp, No Vivianne Master Freddi Che, DMD (Dentistry) Neldon Labella, RN as Case Manager  Indicate any recent Medical Services you may have received from other than [facility] providers in the past year (date may be approximate).  Assessment:   This is a routine wellness examination for Aryn.  Hearing/Vision screen No results found.  Dietary issues and exercise activities discussed:     Goals Addressed             This Visit's Progress    Activity and Exercise Increased        Evidence-based guidance:  Review current exercise levels.  Assess patient perspective on exercise or activity level, barriers to increasing activity, motivation and readiness for change.  Recommend or set healthy exercise goal based on individual tolerance.  Encourage small steps toward making change in amount of exercise or activity.  Urge reduction of sedentary activities or screen time.  Promote group activities within the community or with family or support person.  Consider referral to rehabiliation therapist for assessment and exercise/activity plan.   Notes:        Depression Screen PHQ 2/9 Scores 02/08/2021 02/08/2021 01/18/2021 11/12/2020 08/18/2020 05/21/2020 01/13/2020  PHQ - 2 Score 0 0 2 0 0 5 2  PHQ- 9 Score 0 0 8 - - 13 11  Exception Documentation - - - - - - -  Not completed - - - - - - -    Fall Risk Fall Risk  02/08/2021 01/18/2021 11/30/2020 11/12/2020 08/18/2020  Falls in the past year? 1 - 0 1 0  Comment - - - - -  Number falls in past yr: 1 1 - 1 -  Comment - - - - -  Injury with Fall? 0 0 - 0 -  Comment - - - - -  Risk Factor Category  - - - - -  Comment - - - - -  Risk for fall due to : History of fall(s) History of fall(s) - History of fall(s);Medication side effect;Impaired mobility -  Risk for fall due to: Comment - - - Reports drop foot -  Follow up Falls prevention discussed Falls evaluation completed;Education provided - Falls prevention discussed -  Comment - - - - -    FALL RISK PREVENTION PERTAINING TO THE HOME:  Any stairs in or around the home? Yes  If so, are there any without handrails? No  Home free of loose throw rugs in walkways, pet beds, electrical cords, etc? Yes  Adequate lighting in your home to reduce risk of falls? Yes   ASSISTIVE DEVICES UTILIZED TO PREVENT FALLS:  Life alert? No  Use of a cane, walker or w/c? Yes  Grab bars in the bathroom? No  Shower chair or bench in shower? No  Elevated toilet seat or a handicapped toilet? No    TIMED UP AND GO:  Was the test performed? Yes .  Length of time to ambulate 10 feet: 3 sec.   Gait steady and fast without use of assistive device  Cognitive Function:     6CIT Screen 01/18/2021 12/07/2016  What Year? 0 points 0 points  What month? 0 points 0 points  What time? 0 points 0 points  Count back from 20 0 points 0 points  Months in reverse 0 points 0 points  Repeat phrase 6 points 0 points  Total Score 6 0    Immunizations Immunization History  Administered Date(s) Administered   Influenza,inj,Quad PF,6+ Mos 12/30/2015, 12/07/2016, 12/25/2017, 12/27/2018, 01/13/2020, 01/16/2021   PFIZER(Purple Top)SARS-COV-2 Vaccination 06/06/2019, 07/04/2019, 12/22/2019   Pneumococcal Polysaccharide-23 12/30/2015    TDAP status: Due, Education has been provided regarding the importance of this vaccine. Advised may receive this vaccine at local pharmacy or  Health Dept. Aware to provide a copy of the vaccination record if obtained from local pharmacy or Health Dept. Verbalized acceptance and understanding.  Flu Vaccine status: Up to date  Pneumococcal vaccine status: Up to date  Covid-19 vaccine status: Completed vaccines  Qualifies for Shingles Vaccine? No   Zostavax completed No   Shingrix Completed?: No.    Education has been provided regarding the importance of this vaccine. Patient has been advised to call insurance company to determine out of pocket expense if they have not yet received this vaccine. Advised may also receive vaccine at local pharmacy or Health Dept. Verbalized acceptance and understanding.  Screening Tests Health Maintenance  Topic Date Due   TETANUS/TDAP  Never done   Zoster Vaccines- Shingrix (1 of 2) Never done   Pneumococcal Vaccine 21-27 Years old (2 - PCV) 12/29/2016   COVID-19 Vaccine (4 - Booster for Pfizer series) 02/16/2020   MAMMOGRAM  04/22/2022   COLONOSCOPY (Pts 45-101yrs Insurance coverage will need to be confirmed)  01/16/2026    INFLUENZA VACCINE  Completed   Hepatitis C Screening  Completed   HIV Screening  Completed   HPV VACCINES  Aged Out    Health Maintenance  Health Maintenance Due  Topic Date Due   TETANUS/TDAP  Never done   Zoster Vaccines- Shingrix (1 of 2) Never done   Pneumococcal Vaccine 32-97 Years old (2 - PCV) 12/29/2016   COVID-19 Vaccine (4 - Booster for Pfizer series) 02/16/2020    Colorectal cancer screening: Type of screening: Colonoscopy. Completed 01/16/21. Repeat every 10 years  Mammogram status: Completed 04/22/20. Repeat every year   Lung Cancer Screening: (Low Dose CT Chest recommended if Age 1-80 years, 30 pack-year currently smoking OR have quit w/in 15years.) does not qualify.    Additional Screening:  Hepatitis C Screening: does not qualify; Completed no  Vision Screening: Recommended annual ophthalmology exams for early detection of glaucoma and other disorders of the eye. Is the patient up to date with their annual eye exam?  Yes  Who is the provider or what is the name of the office in which the patient attends annual eye exams? [place]  Dental Screening: Recommended annual dental exams for proper oral hygiene  Community Resource Referral / Chronic Care Management: CRR required this visit?  No   CCM required this visit?  No      Plan:     I have personally reviewed and noted the following in the patient's chart:   Medical and social history Use of alcohol, tobacco or illicit drugs  Current medications and supplements including opioid prescriptions.  Functional ability and status Nutritional status Physical activity Advanced directives List of other physicians Hospitalizations, surgeries, and ER visits in previous 12 months Vitals Screenings to include cognitive, depression, and falls Referrals and appointments  In addition, I have reviewed and discussed with patient certain preventive protocols, quality metrics, and best practice  recommendations. A written personalized care plan for preventive services as well as general preventive health recommendations were provided to patient.     Dionisio David, LPN   [id number]   Nurse Notes: none

## 2021-02-09 ENCOUNTER — Inpatient Hospital Stay: Payer: Medicare Other | Admitting: Family Medicine

## 2021-02-09 DIAGNOSIS — J45909 Unspecified asthma, uncomplicated: Secondary | ICD-10-CM

## 2021-02-09 DIAGNOSIS — I10 Essential (primary) hypertension: Secondary | ICD-10-CM | POA: Diagnosis not present

## 2021-02-10 ENCOUNTER — Telehealth: Payer: Self-pay | Admitting: Internal Medicine

## 2021-02-10 DIAGNOSIS — D2261 Melanocytic nevi of right upper limb, including shoulder: Secondary | ICD-10-CM | POA: Diagnosis not present

## 2021-02-10 DIAGNOSIS — X32XXXS Exposure to sunlight, sequela: Secondary | ICD-10-CM | POA: Diagnosis not present

## 2021-02-10 DIAGNOSIS — L720 Epidermal cyst: Secondary | ICD-10-CM | POA: Diagnosis not present

## 2021-02-10 DIAGNOSIS — L568 Other specified acute skin changes due to ultraviolet radiation: Secondary | ICD-10-CM | POA: Diagnosis not present

## 2021-02-10 NOTE — Telephone Encounter (Signed)
Pt was returning your phone call. Her phone number is [phone]

## 2021-02-10 NOTE — Telephone Encounter (Signed)
Patient wanted to let us know that the time change for the 03/04/21 infusion appt will work.

## 2021-02-11 ENCOUNTER — Other Ambulatory Visit: Payer: Self-pay

## 2021-02-11 ENCOUNTER — Inpatient Hospital Stay: Payer: Medicare Other | Attending: Internal Medicine

## 2021-02-11 VITALS — BP 125/66 | HR 82 | Temp 98.3°F

## 2021-02-11 DIAGNOSIS — D509 Iron deficiency anemia, unspecified: Secondary | ICD-10-CM | POA: Diagnosis not present

## 2021-02-11 DIAGNOSIS — E611 Iron deficiency: Secondary | ICD-10-CM

## 2021-02-11 MED ORDER — IRON SUCROSE 20 MG/ML IV SOLN
200.0000 mg | Freq: Once | INTRAVENOUS | Status: AC
  Administered 2021-02-11: 200 mg via INTRAVENOUS
  Filled 2021-02-11: qty 10

## 2021-02-11 MED ORDER — SODIUM CHLORIDE 0.9 % IV SOLN
Freq: Once | INTRAVENOUS | Status: AC
  Filled 2021-02-11: qty 250

## 2021-02-11 MED ORDER — SODIUM CHLORIDE 0.9 % IV SOLN: 200.0000 mg | Freq: Once | INTRAVENOUS | Status: DC

## 2021-02-11 NOTE — Patient Instructions (Signed)
[facility]  Discharge Instructions: ?Thank you for choosing [facility] to provide your oncology and hematology care.  ?If you have a lab appointment with the Cancer Center, please go directly to the Cancer Center and check in at the registration area. ? ?Wear comfortable clothing and clothing appropriate for easy access to any Portacath or PICC line.  ? ?We strive to give you quality time with your provider. You may need to reschedule your appointment if you arrive late (15 or more minutes).  Arriving late affects you and other patients whose appointments are after yours.  Also, if you miss three or more appointments without notifying the office, you may be dismissed from the clinic at the provider?s discretion.    ?  ?For prescription refill requests, have your pharmacy contact our office and allow 72 hours for refills to be completed.   ? ?Today you received the following chemotherapy and/or immunotherapy agents VENOFER    ?  ?To help prevent nausea and vomiting after your treatment, we encourage you to take your nausea medication as directed. ? ?BELOW ARE SYMPTOMS THAT SHOULD BE REPORTED IMMEDIATELY: ?*FEVER GREATER THAN 100.4 F (38 ?C) OR HIGHER ?*CHILLS OR SWEATING ?*NAUSEA AND VOMITING THAT IS NOT CONTROLLED WITH YOUR NAUSEA MEDICATION ?*UNUSUAL SHORTNESS OF BREATH ?*UNUSUAL BRUISING OR BLEEDING ?*URINARY PROBLEMS (pain or burning when urinating, or frequent urination) ?*BOWEL PROBLEMS (unusual diarrhea, constipation, pain near the anus) ?TENDERNESS IN MOUTH AND THROAT WITH OR WITHOUT PRESENCE OF ULCERS (sore throat, sores in mouth, or a toothache) ?UNUSUAL RASH, SWELLING OR PAIN  ?UNUSUAL VAGINAL DISCHARGE OR ITCHING  ? ?Items with * indicate a potential emergency and should be followed up as soon as possible or go to the Emergency Department if any problems should occur. ? ?Please show the CHEMOTHERAPY ALERT CARD or IMMUNOTHERAPY ALERT CARD at check-in to the  Emergency Department and triage nurse. ? ?Should you have questions after your visit or need to cancel or reschedule your appointment, please contact [facility]  [phone] and follow the prompts.  Office hours are 8:00 a.m. to 4:30 p.m. Monday - Friday. Please note that voicemails left after 4:00 p.m. may not be returned until the following business day.  We are closed weekends and major holidays. You have access to a nurse at all times for urgent questions. Please call the main number to the clinic [phone] and follow the prompts. ? ?For any non-urgent questions, you may also contact your provider using MyChart. We now offer e-Visits for anyone 18 and older to request care online for non-urgent symptoms. For details visit [email or web address]. ?  ?Also download the MyChart app! Go to the app store, search "MyChart", open the app, select [facility], and log in with your MyChart username and password. ? ?Due to Covid, a mask is required upon entering the hospital/clinic. If you do not have a mask, one will be given to you upon arrival. For doctor visits, patients may have 1 support person aged 18 or older with them. For treatment visits, patients cannot have anyone with them due to current Covid guidelines and our immunocompromised population.  ? ?Iron Sucrose Injection ?What is this medication? ?IRON SUCROSE (EYE ern SOO krose) treats low levels of iron (iron deficiency anemia) in people with kidney disease. Iron is a mineral that plays an important role in making red blood cells, which carry oxygen from your lungs to the rest of your body. ?This medicine may   be used for other purposes; ask your health care provider or pharmacist if you have questions. ?COMMON BRAND NAME(S): Venofer ?What should I tell my care team before I take this medication? ?They need to know if you have any of these conditions: ?Anemia not caused by low iron levels ?Heart disease ?High levels of  iron in the blood ?Kidney disease ?Liver disease ?An unusual or allergic reaction to iron, other medications, foods, dyes, or preservatives ?Pregnant or trying to get pregnant ?Breast-feeding ?How should I use this medication? ?This medication is for infusion into a vein. It is given in a hospital or clinic setting. ?Talk to your care team about the use of this medication in children. While this medication may be prescribed for children as young as 2 years for selected conditions, precautions do apply. ?Overdosage: If you think you have taken too much of this medicine contact a poison control center or emergency room at once. ?NOTE: This medicine is only for you. Do not share this medicine with others. ?What if I miss a dose? ?It is important not to miss your dose. Call your care team if you are unable to keep an appointment. ?What may interact with this medication? ?Do not take this medication with any of the following: ?Deferoxamine ?Dimercaprol ?Other iron products ?This medication may also interact with the following: ?Chloramphenicol ?Deferasirox ?This list may not describe all possible interactions. Give your health care provider a list of all the medicines, herbs, non-prescription drugs, or dietary supplements you use. Also tell them if you smoke, drink alcohol, or use illegal drugs. Some items may interact with your medicine. ?What should I watch for while using this medication? ?Visit your care team regularly. Tell your care team if your symptoms do not start to get better or if they get worse. You may need blood work done while you are taking this medication. ?You may need to follow a special diet. Talk to your care team. Foods that contain iron include: whole grains/cereals, dried fruits, beans, or peas, leafy green vegetables, and organ meats (liver, kidney). ?What side effects may I notice from receiving this medication? ?Side effects that you should report to your care team as soon as  possible: ?Allergic reactions--skin rash, itching, hives, swelling of the face, lips, tongue, or throat ?Low blood pressure--dizziness, feeling faint or lightheaded, blurry vision ?Shortness of breath ?Side effects that usually do not require medical attention (report to your care team if they continue or are bothersome): ?Flushing ?Headache ?Joint pain ?Muscle pain ?Nausea ?Pain, redness, or irritation at injection site ?This list may not describe all possible side effects. Call your doctor for medical advice about side effects. You may report side effects to FDA at 1-800-FDA-1088. ?Where should I keep my medication? ?This medication is given in a hospital or clinic and will not be stored at home. ?NOTE: This sheet is a summary. It may not cover all possible information. If you have questions about this medicine, talk to your doctor, pharmacist, or health care provider. ?? 2022 [employer] (2020-07-23 00:00:00) ? ?

## 2021-02-12 ENCOUNTER — Other Ambulatory Visit: Payer: Self-pay | Admitting: Nurse Practitioner

## 2021-02-13 ENCOUNTER — Other Ambulatory Visit: Payer: Self-pay

## 2021-02-13 ENCOUNTER — Ambulatory Visit
Admission: EM | Admit: 2021-02-13 | Discharge: 2021-02-13 | Disposition: A | Discharge status: Home / Self Care | Payer: Medicare Other | Attending: Emergency Medicine | Admitting: Emergency Medicine

## 2021-02-13 DIAGNOSIS — N390 Urinary tract infection, site not specified: Secondary | ICD-10-CM | POA: Diagnosis not present

## 2021-02-13 LAB — URINALYSIS, COMPLETE (UACMP) WITH MICROSCOPIC
Glucose, UA: NEGATIVE mg/dL
Nitrite: POSITIVE — AB
Protein, ur: >300 mg/dL — AB
RBC / HPF: >50 RBC/hpf (ref 0–5)
Specific Gravity, Urine: >1.03 — ABNORMAL HIGH (ref 1.005–1.030)
WBC, UA: >50 WBC/hpf (ref 0–5)
pH: 6 (ref 5.0–8.0)

## 2021-02-13 MED ORDER — NITROFURANTOIN MONOHYD MACRO 100 MG PO CAPS: 100.0000 mg | ORAL_CAPSULE | Freq: Two times a day (BID) | ORAL | 0 refills | Status: AC

## 2021-02-13 MED ORDER — PHENAZOPYRIDINE HCL 200 MG PO TABS: 200.0000 mg | ORAL_TABLET | Freq: Three times a day (TID) | ORAL | 0 refills | Status: DC

## 2021-02-13 NOTE — ED Triage Notes (Signed)
Burning with urination and frequency. Also with chills and hesitancy.

## 2021-02-13 NOTE — Discharge Instructions (Addendum)
You were seen for painful urination and are being treated for urinary tract infection.   - We are sending your urine out for a culture. If we need to add or change any medications, our nurse will give you a call to let you know. - Take the antibiotics as prescribed until they're finished. If you think you're having a reaction, stop the medication, take benadryl and go to the nearest urgent care/emergency room. Take a probiotic while taking the antibiotic to decrease the chances of stomach upset.  -The medication to possibly help with your kidney stones was not prescribed due to your sulfa allergy.  Increase your water intake. -Treat your pain with over-the-counter Tylenol as needed.  Take care, Dr. Marland Kitchen, NP-c

## 2021-02-13 NOTE — ED Provider Notes (Signed)
[facility] - [place], [place]   Name: [name] DOB: 06/15/1960 MRN: [id number] CSN: [id number] PCP: Virginia Crews, MD  Arrival date and time:  02/13/21 0804  Chief Complaint:  Dysuria   NOTE: Prior to seeing the patient today, I have reviewed the triage nursing documentation and vital signs. Clinical staff has updated patient's PMH/PSHx, current medication list, and drug allergies/intolerances to ensure comprehensive history available to assist in medical decision making.   History:   HPI: [name] is a 60 y.o. female who presents today with complaints of painful urination and back pain.  Patient states his symptoms are approximately less than 24 hours ago.  Patient states she was admitted to the hospital for urosepsis 4 months ago.  Symptoms include chills, right lower abdominal pain, painful urination, difficulty starting stream, and nausea.  She denies fevers.  Of note, patient has an extensive list of allergies and sensitivities to antibiotics.  She states she also is prone to history of kidney stones.   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    Chronic headaches    Chronic, continuous use of opioids    DDD (degenerative disc disease)    CERVICAL AND LUMBAR-CHRONIC PAIN, RT HIP LABRAL TEAR   Depression    PT STATES A LOT OF STRESS IN HER LIFE   Dissociative disorder    Dizziness 04/22/2013   Duodenal ulcer with hemorrhage and perforation ([place]) 04/27/2003   Foot drop, right    FROM BACK SURGERY   GERD (gastroesophageal reflux disease)    H/O arthrodesis 08/18/2014    Headache(784.0)    AND NECK PAIN--STATES RECENT TEST SHOW CERVICAL DEGENERATION   History of blood transfusion    s/p back surgery   History of cardiac cath    a. 05/2019 Cath: Nl cors. EF 55-65%.   History of cardioversion    History of cervical spinal surgery 01/04/2015   History of kidney stones    Hypertension    Inverted T wave    Mitral regurgitation    a. 07/2020 Echo: EF 60-65%, no rwma. Nl RV size/fxn. RVSP 39.15mmHg. Mildly to mod dil LA. Mod MR; b. 07/2020 TEE: EF 55-60%. Lambl's excresence. Nl RV size/fxn. Midly dil LA. No LA/LAA thrombus. Mild MR.   Narrowing of intervertebral disc space 08/18/2014   Currently on disability.    Orthostatic hypotension 04/22/2013   Pain    CHRONIC NECK AND BACK PAIN - LIMITED ROM NECK - S/P FUSIONS CERVICAL AND LUMBAR   Paroxysmal atrial fibrillation ([place]) 2021   a. 07/2020 s/p PVI.   Pneumonia    PONV (postoperative nausea and vomiting)    PT GIVES HX OF N&V AND FEVER WITH SURGERIES YEARS AGO--BUT NO PROBLEMS WITH MORE RECENT SURGERIES--STATES NOT MALIGNANT HYPERTHERMIA   Postop Hyponatremia 05/14/2012   Postoperative anemia due to acute blood loss 05/14/2012   PTSD (post-traumatic stress disorder)    Right foot drop    Right hip arthralgia 08/18/2014   Status post surgery of right, and now needs left.  Sleep apnea 2021   does not have a cpap   Therapeutic opioid-induced constipation (OIC)    Typical atrial flutter [facility])     Past Surgical History:  Procedure Laterality Date   ABDOMINAL HYSTERECTOMY     afib  05/2019   ANTERIOR CERVICAL DECOMP/DISCECTOMY FUSION N/A 10/30/2012   Procedure: ACDF C5-6, EXPLORATION AND HARDWARE REMOVAL C6-7;  Surgeon: Melina Schools, MD;  Location: [place];  Service: Orthopedics;  Laterality: N/A;   ANTERIOR FUSION CERVICAL SPINE  MAY 2012   AT [place] N/A 08/06/2020   Procedure: ATRIAL FIBRILLATION ABLATION;  Surgeon: Vickie Epley, MD;  Location: [place] CV LAB;   Service: Cardiovascular;  Laterality: N/A;   BACK SURGERY  2009   LUMBAR FUSION WITH RODS    BREAST BIOPSY Right 2008   benign.- bx/clip   BREAST EXCISIONAL BIOPSY Left 1998   benign   BREAST REDUCTION SURGERY Bilateral 06/2016   CARDIAC ELECTROPHYSIOLOGY MAPPING AND ABLATION     CARDIOVERSION N/A 12/03/2019   Procedure: CARDIOVERSION;  Surgeon: Kate Sable, MD;  Location: [facility];  Service: Cardiovascular;  Laterality: N/A;   CARPAL TUNNEL RELEASE  05-06-12   Right   CHOLECYSTECTOMY     COLONOSCOPY WITH PROPOFOL N/A 01/26/2017   Procedure: COLONOSCOPY WITH PROPOFOL;  Surgeon: Lucilla Lame, MD;  Location: [place];  Service: Endoscopy;  Laterality: N/A;   COLONOSCOPY WITH PROPOFOL N/A 01/16/2021   Procedure: COLONOSCOPY WITH PROPOFOL;  Surgeon: Lin Landsman, MD;  Location: [facility];  Service: Gastroenterology;  Laterality: N/A;   DIAGNOSTIC LAPAROSCOPIES - MULTIPLE FOR ENDOMETRIOSIS     ESOPHAGEAL DILATION N/A 01/26/2017   Procedure: ESOPHAGEAL DILATION;  Surgeon: Lucilla Lame, MD;  Location: [place];  Service: Endoscopy;  Laterality: N/A;   ESOPHAGEAL MANOMETRY N/A 05/30/2017   Procedure: ESOPHAGEAL MANOMETRY (EM);  Surgeon: Lucilla Lame, MD;  Location: [facility];  Service: Endoscopy;  Laterality: N/A;   ESOPHAGOGASTRODUODENOSCOPY (EGD) WITH PROPOFOL N/A 01/26/2017   Procedure: ESOPHAGOGASTRODUODENOSCOPY (EGD) WITH PROPOFOL;  Surgeon: Lucilla Lame, MD;  Location: [place];  Service: Endoscopy;  Laterality: N/A;   ESOPHAGOGASTRODUODENOSCOPY (EGD) WITH PROPOFOL N/A 01/30/2020   Procedure: ESOPHAGOGASTRODUODENOSCOPY (EGD) WITH PROPOFOL;  Surgeon: Lucilla Lame, MD;  Location: [place];  Service: Endoscopy;  Laterality: N/A;  sleep apnea COVID + 01-15-20   ESOPHAGOGASTRODUODENOSCOPY (EGD) WITH PROPOFOL N/A 01/15/2021   Procedure: ESOPHAGOGASTRODUODENOSCOPY (EGD) WITH PROPOFOL;  Surgeon: Lin Landsman, MD;  Location: [place];  Service: Gastroenterology;  Laterality: N/A;   HIP ARTHROSCOPY  09/20/2011   Procedure: ARTHROSCOPY HIP;  Surgeon: Gearlean Alf, MD;  Location: [facility];  Service: Orthopedics;  Laterality: Right;  Right Hip Scope with Labral Debridement   LEFT HEART CATH AND CORONARY ANGIOGRAPHY Left 06/10/2019   Procedure: LEFT HEART CATH AND CORONARY ANGIOGRAPHY;  Surgeon: Nelva Bush, MD;  Location: [place] CV LAB;  Service: Cardiovascular;  Laterality: Left;   NASAL SEPTUM SURGERY  MARCH 2013   IN [place]   [facility] IMPEDANCE STUDY N/A 05/30/2017   Procedure: [place] IMPEDANCE STUDY;  Surgeon: Lucilla Lame, MD;  Location: [facility];  Service: Endoscopy;  Laterality: N/A;   POLYPECTOMY N/A 01/26/2017   Procedure: POLYPECTOMY;  Surgeon: Lucilla Lame, MD;  Location: [place];  Service: Endoscopy;  Laterality: N/A;   POSTERIOR CERVICAL FUSION/FORAMINOTOMY N/A 04/16/2013   Procedure: REMOVAL CERVICAL PLATES AND INTERBODY CAGE/POSTERIOR CERVICAL SPINAL FUSION C4 - C6/C5 CORPECTOMY/C4 - C6 FUSION WITH ILIAC CREST BONE GRAFT;  Surgeon: Melina Schools, MD;  Location: [place];  Service: Orthopedics;  Laterality: N/A;   RADIOFREQUENCY ABLATION NERVES     REDUCTION MAMMAPLASTY Bilateral 05/2016   RIGHT HIP ARTHROSCOPY FOR LABRAL TEAR  ABOUT 2010   2012 also   SHOULDER ARTHROSCOPY  05-06-12   bone spur   TEE WITHOUT CARDIOVERSION N/A 08/05/2020   Procedure: TRANSESOPHAGEAL ECHOCARDIOGRAM (TEE);  Surgeon: Lelon Perla, MD;  Location: [facility] ENDOSCOPY;  Service: Cardiovascular;  Laterality: N/A;   TONSILLECTOMY     AS A CHILD   TOTAL HIP ARTHROPLASTY Right 05/13/2012   Procedure: TOTAL HIP ARTHROPLASTY ANTERIOR APPROACH;  Surgeon: Gearlean Alf, MD;  Location: [facility];  Service: Orthopedics;  Laterality: Right;    Family History  Problem Relation Age of Onset   Aneurysm Mother    Bipolar disorder Sister    Aneurysm Maternal Grandmother    Breast cancer Paternal Grandmother 19   Bipolar  disorder Grandchild    Anxiety disorder Grandchild    Depression Grandchild    Cancer Neg Hx     Social History   Tobacco Use   Smoking status: Never   Smokeless tobacco: Never  Vaping Use   Vaping Use: Never used  Substance Use Topics   Alcohol use: No    Alcohol/week: 0.0 standard drinks   Drug use: Yes    Comment: prescribed morphine and xanax    Patient Active Problem List   Diagnosis Date Noted   Iron deficiency 02/01/2021   Skin abnormalities 01/18/2021   Epigastric abdominal pain    Intractable nausea and vomiting    Ileus ([place]) 01/14/2021   Atrial fibrillation with RVR (HCC)    Enteritis    Chest pain    Chronic shoulder pain (Right) 11/30/2020   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   Arthropathy of shoulder (Right) 11/30/2020   C7 radiculopathy (Right) 11/30/2020   Cervical radiculopathy at C8 (Right) 11/30/2020   Muscle weakness of upper extremity (Right) 11/30/2020   Weakness of upper extremity (Right) 11/30/2020   Cervical fusion syndrome 11/30/2020   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   Chronic use of opiate for therapeutic purpose 08/18/2020   Persistent atrial fibrillation ([place]) 06/30/2020   Abnormal MRI, cervical spine (05/12/2020) 06/24/2020   Chronic upper back pain 06/24/2020   Enthesopathy of shoulder (Left) 05/13/2020   Osteoarthritis of shoulder (Left) 05/13/2020   Symptoms referable to shoulder joint 05/13/2020   Tendinopathy of rotator cuff (Left) 05/13/2020   Sprain of supraspinatus muscle or tendon, sequela (Left) 05/13/2020   Subdeltoid bursitis of shoulder (Left) 05/13/2020   Subacromial bursitis of shoulder (Left) 05/13/2020   Chronic shoulder pain (Left) 05/13/2020   Acute postoperative pain 05/13/2020   History of allergy to iodine 05/13/2020    Class: History of   Abnormal MRI, shoulder (03/20/2017) 04/21/2020   Chronic anticoagulation (Eliquis) 04/21/2020   Chronic neck pain with history of cervical  spinal surgery 04/21/2020   Multiple allergies 04/21/2020   S/P repair of paraesophageal hernia 02/26/2020   Heartburn    Gastritis without bleeding    Typical atrial flutter (HCC)    Foraminal stenosis of cervical region (C3-4) (Right) 08/26/2019   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) 08/26/2019   DDD (degenerative disc disease), cervical 08/25/2019   Cervical radiculitis (C4,C5,C8) (Left) 08/20/2019   Pure hypercholesterolemia 06/27/2019   Constipation due to opioid therapy 06/27/2019   Snoring 06/27/2019   Unstable angina ([place]) 06/10/2019   Chronic musculoskeletal pain 01/14/2019  Hypertension 12/27/2018   Spondylosis without myelopathy or radiculopathy, lumbosacral region 11/12/2018   History of allergy to radiographic contrast media 11/12/2018   History of allergy to shellfish 11/12/2018   DDD (degenerative disc disease), lumbosacral 07/31/2018   Neuropathic pain 07/31/2018   Neurogenic pain 07/31/2018   Vitamin D deficiency 05/28/2018   History of lumbar surgery 05/28/2018   Groin pain, chronic, left 05/20/2018   Other intervertebral disc degeneration, lumbar region 05/20/2018   Disorder of skeletal system 05/20/2018   Problems influencing health status 05/20/2018   Overweight 12/25/2017   Enthesopathy of hip region (Left) 07/13/2017   Trigger point of shoulder region (Left) 02/26/2017   Pharmacologic therapy    Polyp of sigmoid colon    Problems with swallowing and mastication    Chronic shoulder arthropathy (Left) 01/04/2017   Dysphagia 12/07/2016   Abnormal flushing and sweating 12/07/2016   Long term prescription benzodiazepine use 10/11/2016   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) 10/11/2016   Chronic hip pain (2ry area of Pain) (Bilateral) (L>R) 10/11/2016   Osteoarthritis of shoulder (Bilateral) (L>R) 05/17/2016   Cervicogenic headache 07/06/2015   History of total hip replacement (THR) (Right) 07/05/2015   Chronic  shoulder radicular pain (Bilateral) (L>R) 07/05/2015   Lumbar facet syndrome (Bilateral) (L>R) 07/05/2015   Chronic low back pain (1ry area of Pain) (Bilateral) (L>R) w/o sciatica 04/05/2015   Chronic hip pain (Right) 01/04/2015   Encounter for therapeutic drug level monitoring 01/04/2015   Long term current use of opiate analgesic 01/04/2015   Long term prescription opiate use 01/04/2015   Opiate use 01/04/2015   Chronic pain syndrome 01/04/2015   Steroid intolerance 01/04/2015   Cervical spondylosis 01/04/2015   Cervical facet arthropathy (Bilateral) 01/04/2015   History of cervical spinal surgery 01/04/2015   Chronic shoulder pain (3ry area of Pain) (Bilateral) (L>R) 01/04/2015   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   Epidural fibrosis 01/04/2015   Lumbar spondylosis 01/04/2015   Cervical facet syndrome (Bilateral) 01/04/2015   Chronic sacroiliac joint pain (Left) 01/04/2015   DOE (dyspnea on exertion) 09/03/2014   Asthma, mild 08/18/2014   Blurred vision 08/18/2014   Benign paroxysmal positional nystagmus 08/18/2014   Clinical depression 08/18/2014   Fatigue 08/18/2014   Cannot sleep 08/18/2014   Palpitations 08/18/2014   RAD (reactive airway disease) 08/18/2014   OSA (obstructive sleep apnea) 08/18/2014   Lightheadedness 04/22/2013   Chronic neck pain 04/16/2013   Anxiety state 08/03/2003   Colon, diverticulosis 07/15/2003   IBS (irritable bowel syndrome) 04/27/2003   Esophagitis, reflux 02/11/2003   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   phenazopyridine (PYRIDIUM) 200 MG tablet Take 1 tablet (200 mg total) by mouth 3 (three) times daily.    Allergies:   Amoxicillin, Chlorhexidine gluconate, Clindamycin, Codeine, Erythromycin,  Penicillin g, Sulfa antibiotics, Levofloxacin, Shellfish allergy, Decadron [dexamethasone], Mangifera indica, Papaya derivatives, Betadine [povidone iodine], Clarithromycin, Other, Povidone-iodine, and Prednisone  Review of Systems (ROS): Review of Systems  Constitutional:  Negative for chills, fatigue and fever.  Gastrointestinal:  Positive for nausea. Negative for abdominal pain, diarrhea and vomiting.  Genitourinary:  Positive for difficulty urinating, dysuria, flank pain, frequency and pelvic pain.  Skin:  Negative for color change.  All other systems reviewed and are negative.  Vital Signs: Today's Vitals   02/13/21 0817 02/13/21 0820 02/13/21 0859  BP:  (!) 146/95   Pulse:  80   Resp:  17   Temp:  98.3 F (36.8 C)   TempSrc:  Oral   SpO2:  100%   Weight: 147 lb 11.3 oz (67 kg)    Height: 5\' 4"  (1.626 m)    PainSc: 7   7     Physical Exam: Physical Exam Vitals and nursing note reviewed.  Constitutional:      Appearance: Normal appearance.  Cardiovascular:     Rate and Rhythm: Normal rate and regular rhythm.     Pulses: Normal pulses.  Pulmonary:     Effort: Pulmonary effort is normal.     Breath sounds: Normal breath sounds.  Abdominal:     Palpations: Abdomen is soft.     Tenderness: There is abdominal tenderness in the right lower quadrant. There is right CVA tenderness and left CVA tenderness.  Skin:    General: Skin is warm and dry.  Neurological:     Mental Status: She is alert.     Urgent Care Treatments / Results:   LABS: PLEASE NOTE: all labs that were ordered this encounter are listed, however only abnormal results are displayed. Labs Reviewed  URINALYSIS, COMPLETE (UACMP) WITH MICROSCOPIC - Abnormal; Notable for the following components:      Result Value   APPearance CLOUDY (*)    Specific Gravity, Urine >1.030 (*)    Hgb urine dipstick LARGE (*)    Bilirubin Urine SMALL (*)    Ketones, ur TRACE (*)    Protein, ur >300 (*)    Nitrite  POSITIVE (*)    Leukocytes,Ua MODERATE (*)    Bacteria, UA MANY (*)    All other components within normal limits  URINE CULTURE    EKG: -None  RADIOLOGY: No results found.  PROCEDURES: Procedures  MEDICATIONS RECEIVED THIS VISIT: Medications - No data to display  PERTINENT CLINICAL COURSE NOTES/UPDATES:   Initial Impression / Assessment and Plan / Urgent Care Course:  Pertinent labs & imaging results that were available during my care of the patient were personally reviewed by me and considered in my medical decision making (see lab/imaging section of note for values and interpretations).  JAQUAYA COYLE is a 60 y.o. female who presents to [facility] today with complaints of painful urination, diagnosed with urinary tract infection, and treated as such with the medications below. NP and patient reviewed discharge instructions below during visit.   Patient is well appearing overall in clinic today. She does not appear to be in any acute distress. Presenting symptoms (see HPI) and exam as documented above.   I have reviewed the follow up and strict return precautions for any new or worsening symptoms. Patient is aware of symptoms that would be deemed urgent/emergent, and would thus require further evaluation either here or in the emergency department. At the time of discharge, she verbalized understanding and consent with the discharge plan as it was reviewed with her. All questions were fielded by provider and/or clinic staff prior to patient discharge.    Final Clinical Impressions / Urgent Care Diagnoses:   Final diagnoses:  Lower urinary tract infectious disease    New Prescriptions:  [place] Controlled Substance Registry consulted? Not Applicable  Meds ordered this encounter  Medications   nitrofurantoin, macrocrystal-monohydrate, (MACROBID) 100 MG capsule    Sig: Take 1 capsule (100 mg total) by mouth 2 (two) times  daily for 7 days.    Dispense:  14 capsule     Refill:  0   phenazopyridine (PYRIDIUM) 200 MG tablet    Sig: Take 1 tablet (200 mg total) by mouth 3 (three) times daily.    Dispense:  6 tablet    Refill:  0      Discharge Instructions      You were seen for painful urination and are being treated for urinary tract infection.   - We are sending your urine out for a culture. If we need to add or change any medications, our nurse will give you a call to let you know. - Take the antibiotics as prescribed until they're finished. If you think you're having a reaction, stop the medication, take benadryl and go to the nearest urgent care/emergency room. Take a probiotic while taking the antibiotic to decrease the chances of stomach upset.  -The medication to possibly help with your kidney stones was not prescribed due to your sulfa allergy.  Increase your water intake. -Treat your pain with over-the-counter Tylenol as needed.  Take care, Dr. Marland Kitchen, NP-c     Recommended Follow up Care:  Patient encouraged to follow up with the following provider within the specified time frame, or sooner as dictated by the severity of her symptoms. As always, she was instructed that for any urgent/emergent care needs, she should seek care either here or in the emergency department for more immediate evaluation.   Gertie Baron, DNP, NP-c   Gertie Baron, NP 02/13/21 [phone]

## 2021-02-14 NOTE — Telephone Encounter (Signed)
This is a [place] pt 

## 2021-02-16 ENCOUNTER — Ambulatory Visit: Payer: Medicare Other | Admitting: Cardiology

## 2021-02-16 ENCOUNTER — Telehealth: Payer: Self-pay

## 2021-02-16 LAB — URINE CULTURE: Culture: >=100000 — AB

## 2021-02-16 NOTE — Telephone Encounter (Signed)
Copied from [place] [id number]. Topic: General - Other >> Feb 16, 2021  3:46 PM Tessa Lerner A wrote: Reason for CRM: The patient has returned an additional phone call to [facility]   The patient has requested to be contacted again when possible  The patient is having phone difficulties and would like to be called back-to-back if possible   Please contact further when available

## 2021-02-16 NOTE — Telephone Encounter (Signed)
  Care Management   Follow Up Note   02/16/2021 Name: [name] MRN: [id number] DOB: April 20, 1960   Primary Care Provider: Virginia Crews, MD Reason for referral : Chronic Care Management   Received a voice message from [name] regarding need for a Urology referral. Reports being evaluated in the Urgent Care for a UTI within the last week and being advised to follow up with Urology. Attempted to reach [name] today. Left a voice message advising to return call or contact the clinic if a provider appointment is needed.   [facility] Care Management [facility] [phone]

## 2021-02-17 ENCOUNTER — Ambulatory Visit: Payer: Self-pay

## 2021-02-17 ENCOUNTER — Other Ambulatory Visit: Payer: Self-pay

## 2021-02-17 DIAGNOSIS — N390 Urinary tract infection, site not specified: Secondary | ICD-10-CM

## 2021-02-17 DIAGNOSIS — I10 Essential (primary) hypertension: Secondary | ICD-10-CM

## 2021-02-17 DIAGNOSIS — R319 Hematuria, unspecified: Secondary | ICD-10-CM

## 2021-02-17 NOTE — Patient Instructions (Signed)
Thank you for allowing the Chronic Care Management team to participate in your care.  

## 2021-02-17 NOTE — Chronic Care Management (AMB) (Addendum)
Chronic Care Management   CCM RN Visit Note  02/17/2021 Name: [name] MRN: [id number] DOB: 08-14-1960  Subjective: [name] is a 60 y.o. year old female who is a primary care patient of Bacigalupo, Dionne Bucy, MD. The care management team was consulted for assistance with disease management and care coordination needs.    Engaged with patient by telephone for follow up visit in response to provider referral for case management and care coordination services.   Consent to Services:  The patient was given information about Chronic Care Management services, agreed to services, and gave verbal consent prior to initiation of services.  Please see initial visit note for detailed documentation.    Assessment: Review of patient past medical history, allergies, medications, health status, including review of consultants reports, laboratory and other test data, was performed as part of comprehensive evaluation and provision of chronic care management services.   SDOH (Social Determinants of Health) assessments and interventions performed:  No  CCM Care Plan  Allergies  Allergen Reactions   Amoxicillin Hives    She did ok w ANCEF   Chlorhexidine Gluconate Dermatitis and Hives   Clindamycin Hives   Codeine Hives   Erythromycin Hives    "mycins" in general   Penicillin G Hives    "cillins" in general   Sulfa Antibiotics Nausea And Vomiting and Hives        Levofloxacin Hives   Shellfish Allergy Hives   Decadron [Dexamethasone] Other (See Comments)    Hot flashes, insomnia, "manic" Hot flashes, insomnia, "manic"   Mangifera Indica Hives    papaya   Papaya Derivatives Hives   Betadine [Povidone Iodine] Hives        Clarithromycin Hives   Other Hives     Mango    Povidone-Iodine Hives   Prednisone Anxiety    High blood pressure, flushed, mood changes, heart palpitations High blood pressure, flushed, mood changes, heart palpitations    Outpatient Encounter  Medications as of 02/17/2021  Medication Sig   ALPRAZolam (XANAX) 1 MG tablet Take 1 mg by mouth at bedtime.    apixaban (ELIQUIS) 5 MG TABS tablet Take 1 tablet (5 mg total) by mouth 2 (two) times daily.   busPIRone (BUSPAR) 7.5 MG tablet Take 7.5 mg by mouth 2 (two) times daily.   CREON 36000-114000 units CPEP capsule Take 2 capsules with the first bite of each meal and 1 capsule with the first bite of each snack   diltiazem (CARDIZEM CD) 120 MG 24 hr capsule Take 1 capsule (120 mg total) by mouth daily.   diltiazem (CARDIZEM) 30 MG tablet Take 1 tablet (30 mg total) by mouth every 6 (six) hours as needed.   linaclotide (LINZESS) 145 MCG CAPS capsule Take 1 capsule (145 mcg total) by mouth daily before breakfast.   Menthol, Topical Analgesic, (BENGAY EX) Apply 1 application topically daily as needed (pain). (Patient not taking: Reported on 02/08/2021)   montelukast (SINGULAIR) 10 MG tablet TAKE 1 TABLET BY MOUTH EVERY DAY   morphine (MSIR) 15 MG tablet Take 1 tablet (15 mg total) by mouth every 6 (six) hours as needed for moderate pain or severe pain. Must last 30 days.   morphine (MSIR) 15 MG tablet Take 1 tablet (15 mg total) by mouth every 6 (six) hours as needed for moderate pain or severe pain. Must last 30 days.   naphazoline-pheniramine (VISINE) 0.025-0.3 % ophthalmic solution 1 drop 4 (four) times daily as needed for eye irritation.  nitrofurantoin, macrocrystal-monohydrate, (MACROBID) 100 MG capsule Take 1 capsule (100 mg total) by mouth 2 (two) times daily for 7 days.   PANCREATIN PO Take by mouth. 2 tablets before meals and 1 tablet before snacks.   pantoprazole (PROTONIX) 40 MG tablet TAKE 1 TABLET BY MOUTH TWICE A DAY   phenazopyridine (PYRIDIUM) 200 MG tablet Take 1 tablet (200 mg total) by mouth 3 (three) times daily.   polyethylene glycol (MIRALAX / GLYCOLAX) 17 g packet Take 17 g by mouth daily.   sodium chloride (OCEAN) 0.65 % SOLN nasal spray Place 1 spray into both nostrils  daily as needed for congestion ([place]ry[place]).   No facility-administered encounter medications on file as of 02/17/2021.    Patient Active Problem List   Diagnosis Date Noted   Iron deficiency 02/01/2021   Skin abnormalities 01/18/2021   Epigastric abdominal pain    Intractable nausea and vomiting    Ileus (Between) 01/14/2021   Atrial fibrillation with RVR (HCC)    Enteritis    Chest pain    Chronic shoulder pain (Right) 11/30/2020   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   Arthropathy of shoulder (Right) 11/30/2020   C7 radiculopathy (Right) 11/30/2020   Cervical radiculopathy at C8 (Right) 11/30/2020   Muscle weakness of upper extremity (Right) 11/30/2020   Weakness of upper extremity (Right) 11/30/2020   Cervical fusion syndrome 11/30/2020   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   Chronic use of opiate for therapeutic purpose 08/18/2020   Persistent atrial fibrillation ([place]) 06/30/2020   Abnormal MRI, cervical spine (05/12/2020) 06/24/2020   Chronic upper back pain 06/24/2020   Enthesopathy of shoulder (Left) 05/13/2020   Osteoarthritis of shoulder (Left) 05/13/2020   Symptoms referable to shoulder joint 05/13/2020   Tendinopathy of rotator cuff (Left) 05/13/2020   Sprain of supraspinatus muscle or tendon, sequela (Left) 05/13/2020   Subdeltoid bursitis of shoulder (Left) 05/13/2020   Subacromial bursitis of shoulder (Left) 05/13/2020   Chronic shoulder pain (Left) 05/13/2020   Acute postoperative pain 05/13/2020   History of allergy to iodine 05/13/2020    Class: History of   Abnormal MRI, shoulder (03/20/2017) 04/21/2020   Chronic anticoagulation (Eliquis) 04/21/2020   Chronic neck pain with history of cervical spinal surgery 04/21/2020   Multiple allergies 04/21/2020   S/P repair of paraesophageal hernia 02/26/2020   Heartburn    Gastritis without bleeding    Typical atrial flutter (HCC)    Foraminal stenosis of cervical region (C3-4) (Right) 08/26/2019    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) 08/26/2019   DDD (degenerative disc disease), cervical 08/25/2019   Cervical radiculitis (C4,C5,C8) (Left) 08/20/2019   Pure hypercholesterolemia 06/27/2019   Constipation due to opioid therapy 06/27/2019   Snoring 06/27/2019   Unstable angina ([place]) 06/10/2019   Chronic musculoskeletal pain 01/14/2019   Hypertension 12/27/2018   Spondylosis without myelopathy or radiculopathy, lumbosacral region 11/12/2018   History of allergy to radiographic contrast media 11/12/2018   History of allergy to shellfish 11/12/2018   DDD (degenerative disc disease), lumbosacral 07/31/2018   Neuropathic pain 07/31/2018   Neurogenic pain 07/31/2018   Vitamin D deficiency 05/28/2018   History of lumbar surgery 05/28/2018   Groin pain, chronic, left 05/20/2018   Other intervertebral disc degeneration, lumbar region 05/20/2018   Disorder of skeletal system 05/20/2018   Problems influencing health status 05/20/2018   Overweight 12/25/2017   Enthesopathy of hip region (Left) 07/13/2017   Trigger point of shoulder region (Left) 02/26/2017   Pharmacologic therapy  Polyp of sigmoid colon    Problems with swallowing and mastication    Chronic shoulder arthropathy (Left) 01/04/2017   Dysphagia 12/07/2016   Abnormal flushing and sweating 12/07/2016   Long term prescription benzodiazepine use 10/11/2016   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) 10/11/2016   Chronic hip pain (2ry area of Pain) (Bilateral) (L>R) 10/11/2016   Osteoarthritis of shoulder (Bilateral) (L>R) 05/17/2016   Cervicogenic headache 07/06/2015   History of total hip replacement (THR) (Right) 07/05/2015   Chronic shoulder radicular pain (Bilateral) (L>R) 07/05/2015   Lumbar facet syndrome (Bilateral) (L>R) 07/05/2015   Chronic low back pain (1ry area of Pain) (Bilateral) (L>R) w/o sciatica 04/05/2015   Chronic hip pain (Right) 01/04/2015   Encounter for  therapeutic drug level monitoring 01/04/2015   Long term current use of opiate analgesic 01/04/2015   Long term prescription opiate use 01/04/2015   Opiate use 01/04/2015   Chronic pain syndrome 01/04/2015   Steroid intolerance 01/04/2015   Cervical spondylosis 01/04/2015   Cervical facet arthropathy (Bilateral) 01/04/2015   History of cervical spinal surgery 01/04/2015   Chronic shoulder pain (3ry area of Pain) (Bilateral) (L>R) 01/04/2015   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   Epidural fibrosis 01/04/2015   Lumbar spondylosis 01/04/2015   Cervical facet syndrome (Bilateral) 01/04/2015   Chronic sacroiliac joint pain (Left) 01/04/2015   DOE (dyspnea on exertion) 09/03/2014   Asthma, mild 08/18/2014   Blurred vision 08/18/2014   Benign paroxysmal positional nystagmus 08/18/2014   Clinical depression 08/18/2014   Fatigue 08/18/2014   Cannot sleep 08/18/2014   Palpitations 08/18/2014   RAD (reactive airway disease) 08/18/2014   OSA (obstructive sleep apnea) 08/18/2014   Lightheadedness 04/22/2013   Chronic neck pain 04/16/2013   Anxiety state 08/03/2003   Colon, diverticulosis 07/15/2003   IBS (irritable bowel syndrome) 04/27/2003   Esophagitis, reflux 02/11/2003   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    Patient Care Plan: RN Care Management Plan of Care     Problem Identified: A-Fib, Asthma, HTN, Pancreatic Insuffiencey and Falls      Long-Range Goal: Disease Progression Prevented or Minimized   Start Date: 01/25/2021  Expected End Date: 04/25/2021  Priority: High  Note:   Current Barriers:  Chronic Disease Management support and education needs related to Atrial Fibrillation, HTN, Asthma, Pancreatic Insufficiency and Fall Risk  RNCM Clinical Goal(s):  Patient will demonstrate Ongoing adherence to prescribed treatment plan for Atrial Fibrillation, HTN, Asthma, Pancreatic Insufficiency and Fall Risk through collaboration with  the care management team.  Interventions: 1:1 collaboration with primary care provider regarding development and update of comprehensive plan of care as evidenced by provider attestation and co-signature Inter-disciplinary care team collaboration (see longitudinal plan of care) Evaluation of current treatment plan related to  self management and patient's adherence to plan as established by provider   Hypertension Interventions: (Not addressed during this outreach) Last practice recorded BP readings:  BP Readings from Last 3 Encounters:  01/18/21 102/80  01/17/21 (!) 110/54  11/30/20 (!) 140/99  Most recent eGFR/CrCl:  Lab Results  Component Value Date   EGFR 91 01/19/2021    No components found for: CRCL  Reviewed medications and compliance with treatment plan. Advised to continue taking medications as prescribed Provided information regarding established blood pressure parameters along with indications for notifying a provider. Encouraged to monitor and record readings.  Discussed compliance with recommended cardiac prudent diet. Encouraged to read nutrition labels and avoid highly processed foods  when possible. Discussed complications of uncontrolled blood pressure.  Reviewed s/sx of heart attack, stroke and worsening symptoms that require immediate medical attention.   Urinary Tract Infection (New Goal) (Short Term) Discussed recent Urgent care evaluation due to urinary tract infection. Reports taking medications and antibiotics as instructed. Advised to complete course of antibiotics and notify a provider if unable to tolerate the prescribed regimen. Discussed importance of maintaining adequate hydration. Reviewed worsening symptoms that require immediate medical attention. Discussed plan regarding follow up. She was advised by Urgent Care staff to follow up with Urology due to history of frequent UTI's and hematuria.  Discussed plan with PCP. A referral will be placed for  consultation with a Urologist.  Patient Goals/Self-Care Activities: Patient will self administer medications as prescribed Patient will attend all scheduled provider appointments Patient will call pharmacy for medication refills Patient will continue to perform ADL's independently Patient will continue to perform IADL's independently Patient will call provider office for new concerns or questions   Follow Up Plan:   Will follow up next month       PLAN A member of the care management team will follow up next month.   Cristy [facility] Care Management [facility] [phone]

## 2021-02-18 ENCOUNTER — Other Ambulatory Visit: Payer: Self-pay

## 2021-02-18 ENCOUNTER — Inpatient Hospital Stay: Payer: Medicare Other

## 2021-02-18 VITALS — BP 130/70 | HR 72 | Temp 97.2°F | Resp 18

## 2021-02-18 DIAGNOSIS — E611 Iron deficiency: Secondary | ICD-10-CM

## 2021-02-18 DIAGNOSIS — D509 Iron deficiency anemia, unspecified: Secondary | ICD-10-CM | POA: Diagnosis not present

## 2021-02-18 MED ORDER — SODIUM CHLORIDE 0.9 % IV SOLN
Freq: Once | INTRAVENOUS | Status: AC
  Filled 2021-02-18: qty 250

## 2021-02-18 MED ORDER — IRON SUCROSE 20 MG/ML IV SOLN
200.0000 mg | Freq: Once | INTRAVENOUS | Status: AC
  Administered 2021-02-18: 200 mg via INTRAVENOUS
  Filled 2021-02-18: qty 10

## 2021-02-18 MED ORDER — SODIUM CHLORIDE 0.9 % IV SOLN: 200.0000 mg | Freq: Once | INTRAVENOUS | Status: DC

## 2021-02-18 NOTE — Patient Instructions (Signed)
[facility] AT [place]  Discharge Instructions: Thank you for choosing [facility] to provide your oncology and hematology care.  If you have a lab appointment with the [facility], please go directly to the [facility] and check in at the registration area.  Wear comfortable clothing and clothing appropriate for easy access to any Portacath or PICC line.   We strive to give you quality time with your provider. You may need to reschedule your appointment if you arrive late (15 or more minutes).  Arriving late affects you and other patients whose appointments are after yours.  Also, if you miss three or more appointments without notifying the office, you may be dismissed from the clinic at the provider's discretion.      For prescription refill requests, have your pharmacy contact our office and allow 72 hours for refills to be completed.    Today you received the following chemotherapy and/or immunotherapy agents VENOFER      To help prevent nausea and vomiting after your treatment, we encourage you to take your nausea medication as directed.  BELOW ARE SYMPTOMS THAT SHOULD BE REPORTED IMMEDIATELY: *FEVER GREATER THAN 100.4 F (38 C) OR HIGHER *CHILLS OR SWEATING *NAUSEA AND VOMITING THAT IS NOT CONTROLLED WITH YOUR NAUSEA MEDICATION *UNUSUAL SHORTNESS OF BREATH *UNUSUAL BRUISING OR BLEEDING *URINARY PROBLEMS (pain or burning when urinating, or frequent urination) *BOWEL PROBLEMS (unusual diarrhea, constipation, pain near the anus) TENDERNESS IN MOUTH AND THROAT WITH OR WITHOUT PRESENCE OF ULCERS (sore throat, sores in mouth, or a toothache) UNUSUAL RASH, SWELLING OR PAIN  UNUSUAL VAGINAL DISCHARGE OR ITCHING   Items with * indicate a potential emergency and should be followed up as soon as possible or go to the Emergency Department if any problems should occur.  Please show the CHEMOTHERAPY ALERT CARD or IMMUNOTHERAPY ALERT CARD at check-in to the  Emergency Department and triage nurse.  Should you have questions after your visit or need to cancel or reschedule your appointment, please contact [facility] CANCER [place] AT [place]  [phone] and follow the prompts.  Office hours are 8:00 a.m. to 4:30 p.m. Monday - Friday. Please note that voicemails left after 4:00 p.m. may not be returned until the following business day.  We are closed weekends and major holidays. You have access to a nurse at all times for urgent questions. Please call the main number to the clinic [phone] and follow the prompts.  For any non-urgent questions, you may also contact your provider using MyChart. We now offer e-Visits for anyone 25 and older to request care online for non-urgent symptoms. For details visit [email or web address].   Also download the MyChart app! Go to the app store, search "MyChart", open the app, select [facility], and log in with your MyChart username and password.  Due to Covid, a mask is required upon entering the hospital/clinic. If you do not have a mask, one will be given to you upon arrival. For doctor visits, patients may have 1 support person aged 22 or older with them. For treatment visits, patients cannot have anyone with them due to current Covid guidelines and our immunocompromised population.

## 2021-02-22 ENCOUNTER — Other Ambulatory Visit: Payer: Self-pay | Admitting: Gastroenterology

## 2021-02-23 ENCOUNTER — Ambulatory Visit: Payer: Medicare Other | Admitting: Gastroenterology

## 2021-02-23 DIAGNOSIS — G4733 Obstructive sleep apnea (adult) (pediatric): Secondary | ICD-10-CM | POA: Diagnosis not present

## 2021-02-25 ENCOUNTER — Other Ambulatory Visit: Payer: Self-pay

## 2021-02-25 ENCOUNTER — Inpatient Hospital Stay: Payer: Medicare Other

## 2021-02-25 VITALS — BP 126/81 | HR 80 | Temp 96.5°F

## 2021-02-25 DIAGNOSIS — D509 Iron deficiency anemia, unspecified: Secondary | ICD-10-CM | POA: Diagnosis not present

## 2021-02-25 DIAGNOSIS — E611 Iron deficiency: Secondary | ICD-10-CM

## 2021-02-25 MED ORDER — SODIUM CHLORIDE 0.9 % IV SOLN: 200.0000 mg | Freq: Once | INTRAVENOUS | Status: DC

## 2021-02-25 MED ORDER — SODIUM CHLORIDE 0.9 % IV SOLN
Freq: Once | INTRAVENOUS | Status: AC
  Filled 2021-02-25: qty 250

## 2021-02-25 MED ORDER — IRON SUCROSE 20 MG/ML IV SOLN
200.0000 mg | Freq: Once | INTRAVENOUS | Status: AC
  Administered 2021-02-25: 200 mg via INTRAVENOUS
  Filled 2021-02-25: qty 10

## 2021-03-01 ENCOUNTER — Ambulatory Visit: Payer: Medicare Other | Admitting: Urology

## 2021-03-01 ENCOUNTER — Other Ambulatory Visit: Payer: Self-pay

## 2021-03-01 ENCOUNTER — Other Ambulatory Visit: Payer: Self-pay | Admitting: *Deleted

## 2021-03-01 ENCOUNTER — Other Ambulatory Visit
Admission: RE | Admit: 2021-03-01 | Discharge: 2021-03-01 | Disposition: A | Discharge status: Home / Self Care | Payer: Medicare Other | Attending: Urology | Admitting: Urology

## 2021-03-01 VITALS — BP 124/75 | HR 97 | Ht 64.0 in | Wt 145.0 lb

## 2021-03-01 DIAGNOSIS — N390 Urinary tract infection, site not specified: Secondary | ICD-10-CM | POA: Diagnosis not present

## 2021-03-01 DIAGNOSIS — R310 Gross hematuria: Secondary | ICD-10-CM

## 2021-03-01 LAB — URINALYSIS, COMPLETE (UACMP) WITH MICROSCOPIC
Bilirubin Urine: NEGATIVE
Glucose, UA: NEGATIVE mg/dL
Ketones, ur: NEGATIVE mg/dL
Leukocytes,Ua: NEGATIVE
Nitrite: NEGATIVE
Protein, ur: NEGATIVE mg/dL
Specific Gravity, Urine: 1.015 (ref 1.005–1.030)
pH: 7 (ref 5.0–8.0)

## 2021-03-01 MED ORDER — CEPHALEXIN 500 MG PO CAPS: 500.0000 mg | ORAL_CAPSULE | Freq: Two times a day (BID) | ORAL | 0 refills | Status: DC

## 2021-03-01 NOTE — Patient Instructions (Addendum)

## 2021-03-01 NOTE — Progress Notes (Signed)
03/01/21 11:06 AM   [name] 1960/09/06 [id number]  CC: Recurrent UTI, gross hematuria, history of interstitial cystitis  HPI: I saw [name] for the above issues today.  She is a very comorbid 60 year old female who was recently seen in the ED on 02/13/2021 for dysuria and gross hematuria and found to have a pansensitive E. coli UTI.  She was treated with nitrofurantoin which only mildly improved her symptoms, and she continues to have some dysuria and flank pain.  She is also having some incontinence of leakage without realization over the last few weeks.  She reports that she has had some intermittent hematuria not associated with UTIs.  She also reports a history of sepsis from urinary source in December 2022.  She denies any smoking history.  She also reports a history of mild interstitial cystitis diagnosed many years ago, those records are not available to me.  I personally viewed and interpreted the recent CT abdomen and pelvis with contrast from 01/14/2021 that shows no stones or hydronephrosis, no obvious bladder abnormalities.  Urinalysis today few bacteria, 0-5 RBCs, 0-5 WBCs.  PMH: Past Medical History:  Diagnosis Date   Abnormal EKG    HX OF INVERTED T WAVES ON EKG, PALPITATIONS, CHEST PAINS-CARDIAC WORK UP DID NOT SHOW ANY HEART DISEASE   AC (acromioclavicular) joint bone spurs    Acute postoperative pain 01/04/2017   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    Chronic headaches    Chronic, continuous use of opioids    DDD (degenerative disc disease)    CERVICAL AND LUMBAR-CHRONIC PAIN, RT HIP LABRAL TEAR   Depression    PT STATES A LOT OF STRESS IN HER LIFE   Dissociative disorder    Dizziness 04/22/2013   Duodenal ulcer with hemorrhage and perforation ([place]) 04/27/2003   Foot drop, right    FROM BACK SURGERY   GERD  (gastroesophageal reflux disease)    H/O arthrodesis 08/18/2014   Headache(784.0)    AND NECK PAIN--STATES RECENT TEST SHOW CERVICAL DEGENERATION   History of blood transfusion    s/p back surgery   History of cardiac cath    a. 05/2019 Cath: Nl cors. EF 55-65%.   History of cardioversion    History of cervical spinal surgery 01/04/2015   History of kidney stones    Hypertension    Inverted T wave    Mitral regurgitation    a. 07/2020 Echo: EF 60-65%, no rwma. Nl RV size/fxn. RVSP 39.44mmHg. Mildly to mod dil LA. Mod MR; b. 07/2020 TEE: EF 55-60%. Lambl's excresence. Nl RV size/fxn. Midly dil LA. No LA/LAA thrombus. Mild MR.   Narrowing of intervertebral disc space 08/18/2014   Currently on disability.    Orthostatic hypotension 04/22/2013   Pain    CHRONIC NECK AND BACK PAIN - LIMITED ROM NECK - S/P FUSIONS CERVICAL AND LUMBAR   Paroxysmal atrial fibrillation ([place]) 2021   a. 07/2020 s/p PVI.   Pneumonia    PONV (postoperative nausea and vomiting)    PT GIVES HX OF N&V AND FEVER WITH SURGERIES YEARS AGO--BUT NO PROBLEMS WITH MORE RECENT SURGERIES--STATES NOT MALIGNANT HYPERTHERMIA   Postop Hyponatremia 05/14/2012   Postoperative anemia due to acute blood loss 05/14/2012   PTSD (post-traumatic stress disorder)    Right foot drop    Right  hip arthralgia 08/18/2014   Status post surgery of right, and now needs left.    Sleep apnea 2021   does not have a cpap   Therapeutic opioid-induced constipation (OIC)    Typical atrial flutter [facility])     Surgical History: Past Surgical History:  Procedure Laterality Date   ABDOMINAL HYSTERECTOMY     afib  05/2019   ANTERIOR CERVICAL DECOMP/DISCECTOMY FUSION N/A 10/30/2012   Procedure: ACDF C5-6, EXPLORATION AND HARDWARE REMOVAL C6-7;  Surgeon: Melina Schools, MD;  Location: [place];  Service: Orthopedics;  Laterality: N/A;   ANTERIOR FUSION CERVICAL SPINE  MAY 2012   AT [place] N/A 08/06/2020   Procedure: ATRIAL  FIBRILLATION ABLATION;  Surgeon: Vickie Epley, MD;  Location: [place] [facility];  Service: Cardiovascular;  Laterality: N/A;   BACK SURGERY  2009   LUMBAR FUSION WITH RODS    BREAST BIOPSY Right 2008   benign.- bx/clip   BREAST EXCISIONAL BIOPSY Left 1998   benign   BREAST REDUCTION SURGERY Bilateral 06/2016   CARDIAC ELECTROPHYSIOLOGY MAPPING AND ABLATION     CARDIOVERSION N/A 12/03/2019   Procedure: CARDIOVERSION;  Surgeon: Kate Sable, MD;  Location: [facility];  Service: Cardiovascular;  Laterality: N/A;   CARPAL TUNNEL RELEASE  05-06-12   Right   CHOLECYSTECTOMY     COLONOSCOPY WITH PROPOFOL N/A 01/26/2017   Procedure: COLONOSCOPY WITH PROPOFOL;  Surgeon: Lucilla Lame, MD;  Location: [place];  Service: Endoscopy;  Laterality: N/A;   COLONOSCOPY WITH PROPOFOL N/A 01/16/2021   Procedure: COLONOSCOPY WITH PROPOFOL;  Surgeon: Lin Landsman, MD;  Location: [facility] ENDOSCOPY;  Service: Gastroenterology;  Laterality: N/A;   DIAGNOSTIC LAPAROSCOPIES - MULTIPLE FOR ENDOMETRIOSIS     ESOPHAGEAL DILATION N/A 01/26/2017   Procedure: ESOPHAGEAL DILATION;  Surgeon: Lucilla Lame, MD;  Location: [place];  Service: Endoscopy;  Laterality: N/A;   ESOPHAGEAL MANOMETRY N/A 05/30/2017   Procedure: ESOPHAGEAL MANOMETRY (EM);  Surgeon: Lucilla Lame, MD;  Location: [facility];  Service: Endoscopy;  Laterality: N/A;   ESOPHAGOGASTRODUODENOSCOPY (EGD) WITH PROPOFOL N/A 01/26/2017   Procedure: ESOPHAGOGASTRODUODENOSCOPY (EGD) WITH PROPOFOL;  Surgeon: Lucilla Lame, MD;  Location: [place];  Service: Endoscopy;  Laterality: N/A;   ESOPHAGOGASTRODUODENOSCOPY (EGD) WITH PROPOFOL N/A 01/30/2020   Procedure: ESOPHAGOGASTRODUODENOSCOPY (EGD) WITH PROPOFOL;  Surgeon: Lucilla Lame, MD;  Location: [place];  Service: Endoscopy;  Laterality: N/A;  sleep apnea COVID + 01-15-20   ESOPHAGOGASTRODUODENOSCOPY (EGD) WITH PROPOFOL N/A 01/15/2021   Procedure:  ESOPHAGOGASTRODUODENOSCOPY (EGD) WITH PROPOFOL;  Surgeon: Lin Landsman, MD;  Location: [place];  Service: Gastroenterology;  Laterality: N/A;   HIP ARTHROSCOPY  09/20/2011   Procedure: ARTHROSCOPY HIP;  Surgeon: Gearlean Alf, MD;  Location: [facility];  Service: Orthopedics;  Laterality: Right;  Right Hip Scope with Labral Debridement   LEFT HEART CATH AND CORONARY ANGIOGRAPHY Left 06/10/2019   Procedure: LEFT HEART CATH AND CORONARY ANGIOGRAPHY;  Surgeon: Nelva Bush, MD;  Location: [place] [facility];  Service: Cardiovascular;  Laterality: Left;   NASAL SEPTUM SURGERY  MARCH 2013   IN [place]   [facility] IMPEDANCE STUDY N/A 05/30/2017   Procedure: [place] IMPEDANCE STUDY;  Surgeon: Lucilla Lame, MD;  Location: [facility];  Service: Endoscopy;  Laterality: N/A;   POLYPECTOMY N/A 01/26/2017   Procedure: POLYPECTOMY;  Surgeon: Lucilla Lame, MD;  Location: [place];  Service: Endoscopy;  Laterality: N/A;   POSTERIOR CERVICAL FUSION/FORAMINOTOMY N/A 04/16/2013   Procedure: REMOVAL CERVICAL PLATES  AND INTERBODY CAGE/POSTERIOR CERVICAL SPINAL FUSION C4 - C6/C5 CORPECTOMY/C4 - C6 FUSION WITH ILIAC CREST BONE GRAFT;  Surgeon: Melina Schools, MD;  Location: [place];  Service: Orthopedics;  Laterality: N/A;   RADIOFREQUENCY ABLATION NERVES     REDUCTION MAMMAPLASTY Bilateral 05/2016   RIGHT HIP ARTHROSCOPY FOR LABRAL TEAR  ABOUT 2010   2012 also   SHOULDER ARTHROSCOPY  05-06-12   bone spur   TEE WITHOUT CARDIOVERSION N/A 08/05/2020   Procedure: TRANSESOPHAGEAL ECHOCARDIOGRAM (TEE);  Surgeon: Lelon Perla, MD;  Location: [facility] ENDOSCOPY;  Service: Cardiovascular;  Laterality: N/A;   TONSILLECTOMY     AS A CHILD   TOTAL HIP ARTHROPLASTY Right 05/13/2012   Procedure: TOTAL HIP ARTHROPLASTY ANTERIOR APPROACH;  Surgeon: Gearlean Alf, MD;  Location: [facility];  Service: Orthopedics;  Laterality: Right;     Family History: Family History  Problem Relation Age of Onset   Aneurysm Mother     Bipolar disorder Sister    Aneurysm Maternal Grandmother    Breast cancer Paternal Grandmother 98   Bipolar disorder Grandchild    Anxiety disorder Grandchild    Depression Grandchild    Cancer Neg Hx     Social History:  reports that she has never smoked. She has never used smokeless tobacco. She reports current drug use. She reports that she does not drink alcohol.  Physical Exam: BP 124/75 (BP Location: Left Arm, Patient Position: Sitting, Cuff Size: Normal)    Pulse 97    Ht 5\' 4"  (1.626 m)    Wt 145 lb (65.8 kg)    BMI 24.89 kg/m    Constitutional:  Alert and oriented, No acute distress. Cardiovascular: No clubbing, cyanosis, or edema. Respiratory: Normal respiratory effort, no increased work of breathing. GI: Abdomen is soft, nontender, nondistended, no abdominal masses  Laboratory Data: Reviewed, see HPI  Pertinent Imaging: I personally viewed and interpreted the recent CT abdomen and pelvis with contrast from 01/14/2021 that shows no stones or hydronephrosis, no obvious bladder abnormalities.   Assessment & Plan:   60 year old female with 2 UTIs in the last 10 months, and reported gross hematuria not at the time of UTI. We discussed common possible etiologies of hematuria including BPH, malignancy, urolithiasis, medical renal disease, and idiopathic. Standard workup recommended by the AUA includes imaging with CT urogram to assess the upper tracts, and cystoscopy. Cytology is performed on patient's with gross hematuria to look for malignant cells in the urine.  We discussed the evaluation and treatment of patients with recurrent UTIs at length.  We specifically discussed the differences between asymptomatic bacteriuria and true urinary tract infection.  We discussed the AUA definition of recurrent UTI of at least 2 culture proven symptomatic acute cystitis episodes in a 73-month period, or 3 within a 1 year period.  We discussed the importance of culture directed antibiotic  treatment, and antibiotic stewardship.  First-line therapy includes nitrofurantoin(5 days), Bactrim(3 days), or fosfomycin(3 g single dose).  Possible etiologies of recurrent infection include periurethral tissue atrophy in postmenopausal woman, constipation, sexual activity, incomplete emptying, anatomic abnormalities, and even genetic predisposition.  Finally, we discussed the role of perineal hygiene, timed voiding, adequate hydration, topical vaginal estrogen, cranberry prophylaxis, and low-dose antibiotic prophylaxis.  -I recommended a course of Keflex twice daily x5 days for possible persistent UTI/pyelonephritis after treatment with nitrofurantoin which typically only sterilizes the urine -Cystoscopy for further evaluation of reported gross hematuria, as well as recurrent infections -If cystoscopy negative, recommend starting topical estrogen cream for  history of infection  Nickolas Madrid, MD 03/01/2021  [place] [address], [place], [place] [address] [phone]

## 2021-03-04 ENCOUNTER — Inpatient Hospital Stay: Payer: Medicare Other

## 2021-03-04 ENCOUNTER — Other Ambulatory Visit: Payer: Self-pay

## 2021-03-04 VITALS — BP 118/82 | HR 95 | Temp 97.3°F | Resp 18

## 2021-03-04 DIAGNOSIS — D509 Iron deficiency anemia, unspecified: Secondary | ICD-10-CM | POA: Diagnosis not present

## 2021-03-04 DIAGNOSIS — E611 Iron deficiency: Secondary | ICD-10-CM

## 2021-03-04 MED ORDER — SODIUM CHLORIDE 0.9 % IV SOLN
Freq: Once | INTRAVENOUS | Status: AC
  Filled 2021-03-04: qty 250

## 2021-03-04 MED ORDER — IRON SUCROSE 20 MG/ML IV SOLN
200.0000 mg | Freq: Once | INTRAVENOUS | Status: AC
  Administered 2021-03-04: 10:00:00 200 mg via INTRAVENOUS
  Filled 2021-03-04: qty 10

## 2021-03-04 MED ORDER — SODIUM CHLORIDE 0.9 % IV SOLN: 200.0000 mg | Freq: Once | INTRAVENOUS | Status: DC

## 2021-03-07 ENCOUNTER — Other Ambulatory Visit: Payer: Self-pay | Admitting: Family Medicine

## 2021-03-07 ENCOUNTER — Other Ambulatory Visit: Payer: Self-pay | Admitting: Gastroenterology

## 2021-03-07 ENCOUNTER — Other Ambulatory Visit: Payer: Self-pay | Admitting: Nurse Practitioner

## 2021-03-07 DIAGNOSIS — J45909 Unspecified asthma, uncomplicated: Secondary | ICD-10-CM

## 2021-03-15 ENCOUNTER — Telehealth: Payer: Self-pay

## 2021-03-15 ENCOUNTER — Telehealth: Payer: Self-pay | Admitting: Cardiology

## 2021-03-15 NOTE — Telephone Encounter (Signed)
Called patients pharmacy ([facility] in [place]) They stated that they refilled the patients Diltiazem 300 MG on 03/07/21 and then refilled the Diltiazem 120 MG on 03/14/2021. I inquired why they did not discontinue the 300 MG when we reduced her dose in early November. The pharmacist stated that they don't do that automatically. I requested that he remove the medication from her med list. I called patient and informed her of this and she verbalized understanding.   Patient also wanted to clarify if she could take her Diltazem 30 MG for episodes of Afib more than once in a day. I instructed her that she could take it every 6 hours as needed. She stated that she has been in Afib a lot over the last week. Patient is scheduled to see Dr. Quentin Ore on 03/30/20 here in office. She will notify our office if she feels that she needs to be seen sooner.

## 2021-03-15 NOTE — Telephone Encounter (Signed)
Copied from [place] [id number]. Topic: General - Other >> Mar 15, 2021  2:48 PM Alanda Slim E wrote: Reason for CRM: Pt received a letter of denial for her AWV on 11.29.22/ dr must ask for an appeal / please advise

## 2021-03-15 NOTE — Telephone Encounter (Signed)
Pt c/o medication issue:  1. Name of Medication: diltiazem   2. How are you currently taking this medication (dosage and times per day)? Please advise  3. Are you having a reaction (difficulty breathing--STAT)? no  4. What is your medication issue? Patient advised by md to take 120 q d and 30 prn but the pharmacy refilled and gave her single bottle labeled 300 mg and the pill is not the same   Patient states she has taken her 120 mg daily dose and will wait for further advise on above

## 2021-03-16 ENCOUNTER — Ambulatory Visit: Payer: Medicare Other | Admitting: Gastroenterology

## 2021-03-16 ENCOUNTER — Telehealth: Payer: Self-pay

## 2021-03-16 ENCOUNTER — Other Ambulatory Visit: Payer: Self-pay

## 2021-03-16 ENCOUNTER — Encounter: Payer: Self-pay | Admitting: Gastroenterology

## 2021-03-16 VITALS — BP 132/81 | HR 83 | Temp 98.6°F | Ht 64.0 in | Wt 148.5 lb

## 2021-03-16 DIAGNOSIS — K8681 Exocrine pancreatic insufficiency: Secondary | ICD-10-CM

## 2021-03-16 DIAGNOSIS — K580 Irritable bowel syndrome with diarrhea: Secondary | ICD-10-CM | POA: Diagnosis not present

## 2021-03-16 MED ORDER — RIFAXIMIN 550 MG PO TABS: 550.0000 mg | ORAL_TABLET | Freq: Three times a day (TID) | ORAL | 0 refills | Status: AC

## 2021-03-16 NOTE — Patient Instructions (Signed)
[email or web address]

## 2021-03-16 NOTE — Telephone Encounter (Signed)
Started PA on Xifaxan 550mg  through cover my meds

## 2021-03-16 NOTE — Progress Notes (Signed)
Cephas Darby, MD [address]  [place]  [place], [place] [address]  Main: [phone]  Fax: [phone]    Gastroenterology Consultation  Referring Provider:     Virginia Crews, MD Primary Care Physician:  Virginia Crews, MD Primary Gastroenterologist:  Dr. Cephas Darby Reason for Consultation:     EPI        HPI:   MIRJANA TARLETON is a 61 y.o. female referred by Dr. Brita Romp, Dionne Bucy, MD  for consultation & management of exocrine pancreatic insufficiency. Patient has history of paroxysmal A. fib/flutter s/p ablation on 08/06/2020, sinus bradycardia, hypertension, asthma, GERD s/p robotic assisted laparoscopic paraesophageal hernia repair with Nissen's fundoplication in 13/0865 by Dr. Dahlia Byes, history of OSA.  Patient was admitted to [facility] in early November 2022 secondary to severe abdominal discomfort, nausea.  She underwent CT angio chest abdomen and pelvis with no evidence of acute intra-abdominal pathology.  There is no evidence of significant visceral artery stenosis.  There was presence of nonspecific enteritis and small bowel ileus with no evidence of bowel obstruction.  There was also presence of pancreatic atrophy, she is status postcholecystectomy.  Patient was managed conservatively which resolved her ileus and she started moving her bowels.  Subsequently, patient underwent upper endoscopy and colonoscopy including biopsies which were all unremarkable.  There was no evidence of IBD or any other pathology.  Patient is found to have low pancreatic fecal elastase levels 107, therefore started on Creon.  Since discharge, patient reports that she has been taking Creon up to 3 pills daily because she does not have 3 full meals a day.  She gained about 4 pounds since discharge.  She does report ongoing abdominal discomfort, abdominal bloating and loose stools sometimes mushy and foul-smelling and the symptoms are predominantly postprandial.  She is also found to  have iron deficiency anemia, received parenteral iron therapy as outpatient.  She has follow-up labs in 04/2021 by Dr. Rogue Bussing. She was not a smoker in her life.  She does not drink alcohol  NSAIDs: None  Antiplts/Anticoagulants/Anti thrombotics: Eliquis for history of A. fib  GI Procedures:  Upper endoscopy 01/15/21 - Suspected jejunal inflammation characterized by friability. Biopsied. - Normal examined duodenum. Biopsied. - Mucosal changes suspicious for gastritis. Biopsied. - A Nissen fundoplication was found. The wrap appears intact. - Esophagogastric landmarks identified. - Normal gastroesophageal junction and esophagus. DIAGNOSIS:  A. JEJUNUM; COLD BIOPSY:  - ENTERIC MUCOSA WITH DENSE LYMPHOPLASMACYTIC INFILTRATE.  - NEGATIVE FOR ACTIVE INFLAMMATION.  - NEGATIVE FOR DYSPLASIA NAD MALIGNANCY.   B. DUODENUM; COLD BIOPSY:  - DUODENAL MUCOSA WITH NO SIGNIFICANT PATHOLOGIC ALTERATION.  - NEGATIVE FOR FEATURES OF CELIAC DISEASE.  - NEGATIVE FOR DYSPLASIA AND MALIGNANCY.   C. STOMACH, RANDOM; COLD BIOPSY:  - CHRONIC ACTIVE GASTRITIS WITH REACTIVE CHANGES.  - NEGATIVE FOR HELICOBACTER PYLORI BY IMMUNOHISTOCHEMISTRY.  - NEGATIVE FOR INTESTINAL METAPLASIA, DYSPLASIA, AND MALIGNANCY.   Colonoscopy 01/16/2021 - Preparation of the colon was fair. - The examined portion of the ileum was normal. - The entire examined colon is normal. Biopsied. - The distal rectum and anal verge are normal on retroflexion view. DIAGNOSIS:  A. COLON, RANDOM; COLD BIOPSY:  - COLONIC MUCOSA WITH NO SIGNIFICANT PATHOLOGIC ALTERATION.  - NEGATIVE FOR ACTIVE INFLAMMATION AND FEATURES OF CHRONICITY.  - NEGATIVE FOR DYSPLASIA AND MALIGNANCY.   Past Medical History:  Diagnosis Date   Abnormal EKG    HX OF INVERTED T WAVES ON EKG, PALPITATIONS, CHEST PAINS-CARDIAC  WORK UP DID NOT SHOW ANY HEART DISEASE   AC (acromioclavicular) joint bone spurs    Acute postoperative pain 01/04/2017   Addison anemia  08/15/2004   Anemia    Iron Infusion-8 yrs ago   Anxiety    Asthma    Cephalalgia 08/18/2014   Cervical disc disease 08/18/2014   Needs neck surgery.    Chronic headaches    Chronic, continuous use of opioids    DDD (degenerative disc disease)    CERVICAL AND LUMBAR-CHRONIC PAIN, RT HIP LABRAL TEAR   Depression    PT STATES A LOT OF STRESS IN HER LIFE   Dissociative disorder    Dizziness 04/22/2013   Duodenal ulcer with hemorrhage and perforation ([place]) 04/27/2003   Foot drop, right    FROM BACK SURGERY   GERD (gastroesophageal reflux disease)    H/O arthrodesis 08/18/2014   Headache(784.0)    AND NECK PAIN--STATES RECENT TEST SHOW CERVICAL DEGENERATION   History of blood transfusion    s/p back surgery   History of cardiac cath    a. 05/2019 Cath: Nl cors. EF 55-65%.   History of cardioversion    History of cervical spinal surgery 01/04/2015   History of kidney stones    Hypertension    Inverted T wave    Mitral regurgitation    a. 07/2020 Echo: EF 60-65%, no rwma. Nl RV size/fxn. RVSP 39.87mmHg. Mildly to mod dil LA. Mod MR; b. 07/2020 TEE: EF 55-60%. Lambl's excresence. Nl RV size/fxn. Midly dil LA. No LA/LAA thrombus. Mild MR.   Narrowing of intervertebral disc space 08/18/2014   Currently on disability.    Orthostatic hypotension 04/22/2013   Pain    CHRONIC NECK AND BACK PAIN - LIMITED ROM NECK - S/P FUSIONS CERVICAL AND LUMBAR   Paroxysmal atrial fibrillation ([place]) 2021   a. 07/2020 s/p PVI.   Pneumonia    PONV (postoperative nausea and vomiting)    PT GIVES HX OF N&V AND FEVER WITH SURGERIES YEARS AGO--BUT NO PROBLEMS WITH MORE RECENT SURGERIES--STATES NOT MALIGNANT HYPERTHERMIA   Postop Hyponatremia 05/14/2012   Postoperative anemia due to acute blood loss 05/14/2012   PTSD (post-traumatic stress disorder)    Right foot drop    Right hip arthralgia 08/18/2014   Status post surgery of right, and now needs left.    Sleep apnea 2021   does not have a cpap    Therapeutic opioid-induced constipation (OIC)    Typical atrial flutter [facility])     Past Surgical History:  Procedure Laterality Date   ABDOMINAL HYSTERECTOMY     afib  05/2019   ANTERIOR CERVICAL DECOMP/DISCECTOMY FUSION N/A 10/30/2012   Procedure: ACDF C5-6, EXPLORATION AND HARDWARE REMOVAL C6-7;  Surgeon: Melina Schools, MD;  Location: [place];  Service: Orthopedics;  Laterality: N/A;   ANTERIOR FUSION CERVICAL SPINE  MAY 2012   AT [place] N/A 08/06/2020   Procedure: ATRIAL FIBRILLATION ABLATION;  Surgeon: Vickie Epley, MD;  Location: [place] [facility];  Service: Cardiovascular;  Laterality: N/A;   BACK SURGERY  2009   LUMBAR FUSION WITH RODS    BREAST BIOPSY Right 2008   benign.- bx/clip   BREAST EXCISIONAL BIOPSY Left 1998   benign   BREAST REDUCTION SURGERY Bilateral 06/2016   CARDIAC ELECTROPHYSIOLOGY MAPPING AND ABLATION     CARDIOVERSION N/A 12/03/2019   Procedure: CARDIOVERSION;  Surgeon: Kate Sable, MD;  Location: [facility];  Service: Cardiovascular;  Laterality: N/A;   CARPAL  TUNNEL RELEASE  05-06-12   Right   CHOLECYSTECTOMY     COLONOSCOPY WITH PROPOFOL N/A 01/26/2017   Procedure: COLONOSCOPY WITH PROPOFOL;  Surgeon: Lucilla Lame, MD;  Location: [place];  Service: Endoscopy;  Laterality: N/A;   COLONOSCOPY WITH PROPOFOL N/A 01/16/2021   Procedure: COLONOSCOPY WITH PROPOFOL;  Surgeon: Lin Landsman, MD;  [facility];  Service: Gastroenterology;  Laterality: N/A;   DIAGNOSTIC LAPAROSCOPIES - MULTIPLE FOR ENDOMETRIOSIS     ESOPHAGEAL DILATION N/A 01/26/2017   Procedure: ESOPHAGEAL DILATION;  Surgeon: Lucilla Lame, MD;  Location: [place];  Service: Endoscopy;  Laterality: N/A;   ESOPHAGEAL MANOMETRY N/A 05/30/2017   Procedure: ESOPHAGEAL MANOMETRY (EM);  Surgeon: Lucilla Lame, MD;  Location: [facility];  Service: Endoscopy;  Laterality: N/A;   ESOPHAGOGASTRODUODENOSCOPY (EGD) WITH PROPOFOL N/A  01/26/2017   Procedure: ESOPHAGOGASTRODUODENOSCOPY (EGD) WITH PROPOFOL;  Surgeon: Lucilla Lame, MD;  Location: [place];  Service: Endoscopy;  Laterality: N/A;   ESOPHAGOGASTRODUODENOSCOPY (EGD) WITH PROPOFOL N/A 01/30/2020   Procedure: ESOPHAGOGASTRODUODENOSCOPY (EGD) WITH PROPOFOL;  Surgeon: Lucilla Lame, MD;  Location: [place];  Service: Endoscopy;  Laterality: N/A;  sleep apnea COVID + 01-15-20   ESOPHAGOGASTRODUODENOSCOPY (EGD) WITH PROPOFOL N/A 01/15/2021   Procedure: ESOPHAGOGASTRODUODENOSCOPY (EGD) WITH PROPOFOL;  Surgeon: Lin Landsman, MD;  Location: [place];  Service: Gastroenterology;  Laterality: N/A;   HIP ARTHROSCOPY  09/20/2011   Procedure: ARTHROSCOPY HIP;  Surgeon: Gearlean Alf, MD;  Location: [facility];  Service: Orthopedics;  Laterality: Right;  Right Hip Scope with Labral Debridement   LEFT HEART CATH AND CORONARY ANGIOGRAPHY Left 06/10/2019   Procedure: LEFT HEART CATH AND CORONARY ANGIOGRAPHY;  Surgeon: Nelva Bush, MD;  Location: [place] [facility];  Service: Cardiovascular;  Laterality: Left;   NASAL SEPTUM SURGERY  MARCH 2013   IN [place]   [facility] IMPEDANCE STUDY N/A 05/30/2017   Procedure: [place] IMPEDANCE STUDY;  Surgeon: Lucilla Lame, MD;  Location: [facility];  Service: Endoscopy;  Laterality: N/A;   POLYPECTOMY N/A 01/26/2017   Procedure: POLYPECTOMY;  Surgeon: Lucilla Lame, MD;  Location: [place];  Service: Endoscopy;  Laterality: N/A;   POSTERIOR CERVICAL FUSION/FORAMINOTOMY N/A 04/16/2013   Procedure: REMOVAL CERVICAL PLATES AND INTERBODY CAGE/POSTERIOR CERVICAL SPINAL FUSION C4 - C6/C5 CORPECTOMY/C4 - C6 FUSION WITH ILIAC CREST BONE GRAFT;  Surgeon: Melina Schools, MD;  Location: [place];  Service: Orthopedics;  Laterality: N/A;   RADIOFREQUENCY ABLATION NERVES     REDUCTION MAMMAPLASTY Bilateral 05/2016   RIGHT HIP ARTHROSCOPY FOR LABRAL TEAR  ABOUT 2010   2012 also   SHOULDER ARTHROSCOPY  05-06-12   bone spur    TEE WITHOUT CARDIOVERSION N/A 08/05/2020   Procedure: TRANSESOPHAGEAL ECHOCARDIOGRAM (TEE);  Surgeon: Lelon Perla, MD;  Location: [facility] ENDOSCOPY;  Service: Cardiovascular;  Laterality: N/A;   TONSILLECTOMY     AS A CHILD   TOTAL HIP ARTHROPLASTY Right 05/13/2012   Procedure: TOTAL HIP ARTHROPLASTY ANTERIOR APPROACH;  Surgeon: Gearlean Alf, MD;  Location: [facility];  Service: Orthopedics;  Laterality: Right;   Current Outpatient Medications:    ALPRAZolam (XANAX) 1 MG tablet, Take 1 mg by mouth at bedtime. , Disp: , Rfl:    apixaban (ELIQUIS) 5 MG TABS tablet, Take 1 tablet (5 mg total) by mouth 2 (two) times daily., Disp: 60 tablet, Rfl: 5   busPIRone (BUSPAR) 7.5 MG tablet, Take 7.5 mg by mouth 2 (two) times daily., Disp: , Rfl:    cephALEXin (KEFLEX) 500 MG  capsule, Take 1 capsule (500 mg total) by mouth 2 (two) times daily., Disp: 10 capsule, Rfl: 0   CREON 36000-114000 units CPEP capsule, Take 2 capsules with the first bite of each meal and 1 capsule with the first bite of each snack, Disp: 720 capsule, Rfl: 2   diltiazem (CARDIZEM CD) 120 MG 24 hr capsule, Take 1 capsule (120 mg total) by mouth daily., Disp: 30 capsule, Rfl: 0   diltiazem (CARDIZEM) 30 MG tablet, TAKE 1 TABLET BY MOUTH EVERY 6 HOURS AS NEEDED., Disp: 90 tablet, Rfl: 0   linaclotide (LINZESS) 145 MCG CAPS capsule, Take 1 capsule (145 mcg total) by mouth daily before breakfast., Disp: 30 capsule, Rfl: 0   Menthol, Topical Analgesic, (BENGAY EX), Apply 1 application topically daily as needed (pain)., Disp: , Rfl:    montelukast (SINGULAIR) 10 MG tablet, TAKE 1 TABLET BY MOUTH EVERY DAY, Disp: 90 tablet, Rfl: 1   morphine (MSIR) 15 MG tablet, Take 1 tablet (15 mg total) by mouth every 6 (six) hours as needed for moderate pain or severe pain. Must last 30 days., Disp: 120 tablet, Rfl: 0   naphazoline-pheniramine (VISINE) 0.025-0.3 % ophthalmic solution, 1 drop 4 (four) times daily as needed for eye irritation., Disp: , Rfl:     PANCREATIN PO, Take by mouth. 2 tablets before meals and 1 tablet before snacks., Disp: , Rfl:    pantoprazole (PROTONIX) 40 MG tablet, TAKE 1 TABLET BY MOUTH TWICE A DAY, Disp: 180 tablet, Rfl: 3   phenazopyridine (PYRIDIUM) 200 MG tablet, Take 1 tablet (200 mg total) by mouth 3 (three) times daily., Disp: 6 tablet, Rfl: 0   polyethylene glycol (MIRALAX / GLYCOLAX) 17 g packet, Take 17 g by mouth daily., Disp: , Rfl:    rifaximin (XIFAXAN) 550 MG TABS tablet, Take 1 tablet (550 mg total) by mouth 3 (three) times daily for 14 days., Disp: 42 tablet, Rfl: 0   sodium chloride (OCEAN) 0.65 % SOLN nasal spray, Place 1 spray into both nostrils daily as needed for congestion (Dryness)., Disp: , Rfl:    morphine (MSIR) 15 MG tablet, Take 1 tablet (15 mg total) by mouth every 6 (six) hours as needed for moderate pain or severe pain. Must last 30 days., Disp: 120 tablet, Rfl: 0    Family History  Problem Relation Age of Onset   Aneurysm Mother    Bipolar disorder Sister    Aneurysm Maternal Grandmother    Breast cancer Paternal Grandmother 43   Bipolar disorder Grandchild    Anxiety disorder Grandchild    Depression Grandchild    Cancer Neg Hx      Social History   Tobacco Use   Smoking status: Never   Smokeless tobacco: Never  Vaping Use   Vaping Use: Never used  Substance Use Topics   Alcohol use: No    Alcohol/week: 0.0 standard drinks   Drug use: Yes    Comment: prescribed morphine and xanax    Allergies as of 03/16/2021 - Review Complete 03/16/2021  Allergen Reaction Noted   Amoxicillin Hives 08/20/2014   Chlorhexidine gluconate Dermatitis and Hives 07/06/2016   Clindamycin Hives 08/20/2014   Codeine Hives 08/21/2011   Erythromycin Hives 07/06/2016   Penicillin g Hives 07/06/2016   Sulfa antibiotics Nausea And Vomiting and Hives 09/06/2011   Levofloxacin Hives 02/18/2018   Shellfish allergy Hives 10/08/2012   Decadron [dexamethasone] Other (See Comments) 09/20/2011    Mangifera indica Hives 07/20/2016   Papaya derivatives Hives 10/30/2012  Betadine [povidone iodine] Hives 09/06/2011   Clarithromycin Hives 09/08/2011   Other Hives 09/06/2011   Povidone-iodine Hives 09/06/2011   Prednisone Anxiety 08/18/2014    Review of Systems:    All systems reviewed and negative except where noted in HPI.   Physical Exam:  BP 132/81 (BP Location: Left Arm, Patient Position: Sitting, Cuff Size: Normal)    Pulse 83    Temp 98.6 F (37 C) (Oral)    Ht 5\' 4"  (1.626 m)    Wt 148 lb 8 oz (67.4 kg)    BMI 25.49 kg/m  No LMP recorded. Patient has had a hysterectomy.  General:   Alert,  Well-developed, well-nourished, pleasant and cooperative in NAD Head:  Normocephalic and atraumatic. Eyes:  Sclera clear, no icterus.   Conjunctiva pink. Ears:  Normal auditory acuity. Nose:  No deformity, discharge, or lesions. Mouth:  No deformity or lesions,oropharynx pink & moist. Neck:  Supple; no masses or thyromegaly. Lungs:  Respirations even and unlabored.  Clear throughout to auscultation.   No wheezes, crackles, or rhonchi. No acute distress. Heart:  Regular rate and rhythm; no murmurs, clicks, rubs, or gallops. Abdomen:  Normal bowel sounds. Soft, mild diffuse lower abdominal tenderness, mildly distended, tympanic to percussion without masses, hepatosplenomegaly or hernias noted.  No guarding or rebound tenderness.   Rectal: Not performed Msk:  Symmetrical without gross deformities. Good, equal movement & strength bilaterally. Pulses:  Normal pulses noted. Extremities:  No clubbing or edema.  No cyanosis. Neurologic:  Alert and oriented x3;  grossly normal neurologically. Skin:  Intact without significant lesions or rashes. No jaundice. Lymph Nodes:  No significant cervical adenopathy. Psych:  Alert and cooperative. Normal mood and affect.  Imaging Studies: Reviewed  Assessment and Plan:   NORENA BRATTON is a 61 y.o. female with history of A. fib/a flutter s/p  ablation on Eliquis, s/p Nissen's fundoplication in 5170, s/p cholecystectomy is seen in consultation for symptoms of postprandial abdominal discomfort, bloating, cramps and nonbloody diarrhea without any weight loss.  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  Therefore, recommend 2 weeks course of Xifaxan 550 mg 3 times daily  Iron deficiency anemia S/p parenteral iron therapy EGD and colonoscopy are unremarkable Follow-up with hematology in February.  If persistently anemic, recommend video capsule endoscopy   Follow up in 4 months   Cephas Darby, MD

## 2021-03-17 ENCOUNTER — Other Ambulatory Visit: Payer: Medicare Other | Admitting: Urology

## 2021-03-17 ENCOUNTER — Ambulatory Visit: Payer: Self-pay | Admitting: *Deleted

## 2021-03-17 NOTE — Telephone Encounter (Signed)
Insurance approved medication

## 2021-03-17 NOTE — Telephone Encounter (Signed)
Completed PA and attached last office visit notes and sent to insurance

## 2021-03-17 NOTE — Telephone Encounter (Signed)
Summary: Covid Positive/Requesting medication   Pt stated she tested positive for COVID today. Pt stated swollen lymph nodes she can swallow but it hurts, stiff neck,sore throat.  Pt is requesting Medication.        Chief Complaint: requesting antiviral medication. Positive covid today  Symptoms: headaches, sore throat, neck pain, pain swallowing, chest tightness at times  Frequency: started today  Pertinent Negatives: Patient denies chest pain, difficulty breathing, no fever, no cough Disposition: [] ED /[x] Urgent Care (no appt availability in office) / [] Appointment(In office/virtual)/ []  [facility]/ [] Home Care/ [] Refused Recommended Disposition /[] [facility]/ []  Follow-up with PCP Additional Notes: Hx multiple comorbidities. Contacted [facility] no available appt VV. Recommended UC and patent refused. please advise           Reason for Disposition  [1] HIGH RISK for severe COVID complications (e.g., weak immune system, age > 61 years, obesity with BMI > 25, pregnant, chronic lung disease or other chronic medical condition) AND [2] COVID symptoms (e.g., cough, fever)  (Exceptions: Already seen by PCP and no new or worsening symptoms.)  Answer Assessment - Initial Assessment Questions 1. COVID-19 DIAGNOSIS: "Who made your COVID-19 diagnosis?" "Was it confirmed by a positive lab test or self-test?" If not diagnosed by a doctor (or NP/PA), ask "Are there lots of cases (community spread) where you live?" Note: See public health department website, if unsure.     Positive with at home test today 03/17/21 2. COVID-19 EXPOSURE: "Was there any known exposure to COVID before the symptoms began?" CDC Definition of close contact: within 6 feet (2 meters) for a total of 15 minutes or more over a 24-hour period.      na 3. ONSET: "When did the COVID-19 symptoms start?"      Today 03/17/21 4. WORST SYMPTOM: "What is your worst symptom?" (e.g., cough, fever, shortness of breath,  muscle aches)     Headache , sore throat , pain swallowing , neck pain, swollen lymphs  5. COUGH: "Do you have a cough?" If Yes, ask: "How bad is the cough?"       No  6. FEVER: "Do you have a fever?" If Yes, ask: "What is your temperature, how was it measured, and when did it start?"     no 7. RESPIRATORY STATUS: "Describe your breathing?" (e.g., shortness of breath, wheezing, unable to speak)      Ok c/o chest  8. BETTER-SAME-WORSE: "Are you getting better, staying the same or getting worse compared to yesterday?"  If getting worse, ask, "In what way?"     na 9. HIGH RISK DISEASE: "Do you have any chronic medical problems?" (e.g., asthma, heart or lung disease, weak immune system, obesity, etc.)     Hx multiple compromised  10. VACCINE: "Have you had the COVID-19 vaccine?" If Yes, ask: "Which one, how many shots, when did you get it?"       Yes x 2 moderna  11. BOOSTER: "Have you received your COVID-19 booster?" If Yes, ask: "Which one and when did you get it?"       X 2 moderna  12. PREGNANCY: "Is there any chance you are pregnant?" "When was your last menstrual period?"       na 13. OTHER SYMPTOMS: "Do you have any other symptoms?"  (e.g., chills, fatigue, headache, loss of smell or taste, muscle pain, sore throat)       Headache , swollen lymph nodes, neck pain, sore throat ,  14. O2 SATURATION  MONITOR:  "Do you use an oxygen saturation monitor (pulse oximeter) at home?" If Yes, ask "What is your reading (oxygen level) today?" "What is your usual oxygen saturation reading?" (e.g., 95%)       na  Protocols used: Coronavirus (COVID-19) Diagnosed or Suspected-A-AH

## 2021-03-18 ENCOUNTER — Telehealth: Payer: Self-pay | Admitting: Pain Medicine

## 2021-03-18 ENCOUNTER — Other Ambulatory Visit: Payer: Self-pay

## 2021-03-18 MED ORDER — MOLNUPIRAVIR EUA 200MG CAPSULE: 4.0000 | ORAL_CAPSULE | Freq: Two times a day (BID) | ORAL | 0 refills | Status: AC

## 2021-03-18 NOTE — Telephone Encounter (Signed)
Please give her the phone number for [facility] [place] pharmacy for pharmacist to review med list and Rx Paxlovid. Please make sure she has recent (<1y) GFR in the chart before having her call as they cannot Rx if she does not have that.

## 2021-03-18 NOTE — Telephone Encounter (Signed)
Patient advised to call [facility] pharmacy [phone]. 

## 2021-03-24 ENCOUNTER — Encounter: Payer: Self-pay | Admitting: Urology

## 2021-03-24 ENCOUNTER — Ambulatory Visit: Payer: Medicare Other | Admitting: Urology

## 2021-03-24 ENCOUNTER — Ambulatory Visit: Payer: Medicare Other | Attending: Pain Medicine | Admitting: Pain Medicine

## 2021-03-24 ENCOUNTER — Encounter: Payer: Self-pay | Admitting: Pain Medicine

## 2021-03-24 ENCOUNTER — Other Ambulatory Visit: Payer: Self-pay

## 2021-03-24 VITALS — BP 139/85 | HR 83 | Ht 64.0 in | Wt 148.0 lb

## 2021-03-24 VITALS — BP 154/94 | HR 96 | Temp 97.3°F | Resp 16 | Ht 64.0 in | Wt 148.0 lb

## 2021-03-24 DIAGNOSIS — Z79899 Other long term (current) drug therapy: Secondary | ICD-10-CM | POA: Insufficient documentation

## 2021-03-24 DIAGNOSIS — M7918 Myalgia, other site: Secondary | ICD-10-CM | POA: Diagnosis not present

## 2021-03-24 DIAGNOSIS — G894 Chronic pain syndrome: Secondary | ICD-10-CM | POA: Diagnosis not present

## 2021-03-24 DIAGNOSIS — G8929 Other chronic pain: Secondary | ICD-10-CM | POA: Diagnosis not present

## 2021-03-24 DIAGNOSIS — Z79891 Long term (current) use of opiate analgesic: Secondary | ICD-10-CM | POA: Diagnosis not present

## 2021-03-24 DIAGNOSIS — M25552 Pain in left hip: Secondary | ICD-10-CM | POA: Insufficient documentation

## 2021-03-24 DIAGNOSIS — M545 Low back pain, unspecified: Secondary | ICD-10-CM | POA: Diagnosis not present

## 2021-03-24 DIAGNOSIS — M25511 Pain in right shoulder: Secondary | ICD-10-CM | POA: Insufficient documentation

## 2021-03-24 DIAGNOSIS — M25512 Pain in left shoulder: Secondary | ICD-10-CM | POA: Diagnosis not present

## 2021-03-24 DIAGNOSIS — M25551 Pain in right hip: Secondary | ICD-10-CM | POA: Insufficient documentation

## 2021-03-24 DIAGNOSIS — R310 Gross hematuria: Secondary | ICD-10-CM | POA: Diagnosis not present

## 2021-03-24 DIAGNOSIS — G8928 Other chronic postprocedural pain: Secondary | ICD-10-CM | POA: Insufficient documentation

## 2021-03-24 DIAGNOSIS — Z9889 Other specified postprocedural states: Secondary | ICD-10-CM | POA: Diagnosis not present

## 2021-03-24 DIAGNOSIS — M961 Postlaminectomy syndrome, not elsewhere classified: Secondary | ICD-10-CM | POA: Diagnosis not present

## 2021-03-24 DIAGNOSIS — M542 Cervicalgia: Secondary | ICD-10-CM | POA: Diagnosis not present

## 2021-03-24 MED ORDER — MORPHINE SULFATE 15 MG PO TABS: 15.0000 mg | ORAL_TABLET | Freq: Four times a day (QID) | ORAL | 0 refills | Status: DC | PRN

## 2021-03-24 MED ORDER — ESTRADIOL 0.1 MG/GM VA CREA: TOPICAL_CREAM | VAGINAL | 1 refills | Status: DC

## 2021-03-24 NOTE — Progress Notes (Signed)
Nursing Pain Medication Assessment:  Safety precautions to be maintained throughout the outpatient stay will include: orient to surroundings, keep bed in low position, maintain call bell within reach at all times, provide assistance with transfer out of bed and ambulation.  Medication Inspection Compliance: Pill count conducted under aseptic conditions, in front of the patient. Neither the pills nor the bottle was removed from the patient's sight at any time. Once count was completed pills were immediately returned to the patient in their original bottle.  Medication: Morphine IR Pill/Patch Count:  1 of 120 pills remain Pill/Patch Appearance: Markings consistent with prescribed medication Bottle Appearance: Standard pharmacy container. Clearly labeled. Filled Date: 50 / 06 / 2022 Last Medication intake:  Today

## 2021-03-24 NOTE — Progress Notes (Signed)
PROVIDER NOTE: Information contained herein reflects review and annotations entered in association with encounter. Interpretation of such information and data should be left to medically-trained personnel. Information provided to patient can be located elsewhere in the medical record under "Patient Instructions". Document created using STT-dictation technology, any transcriptional errors that may result from process are unintentional.    Patient: [name]  Service Category: E/M  Provider: Gaspar Cola, MD  DOB: 1960/05/18  DOS: 03/24/2021  Specialty: Interventional Pain Management  MRN: [id number]  Setting: Ambulatory outpatient  PCP: Virginia Crews, MD  Type: Established Patient    Referring Provider: Virginia Crews, MD  Location: Office  Delivery: Face-to-face     HPI  [name], a 61 y.o. year old female, is here today because of her Chronic bilateral low back pain without sciatica [M54.50, G89.29]. [name] primary complain today is Back Pain (lower), Shoulder Pain (right), and Elbow Pain (right) Last encounter: My last encounter with her was on 03/18/2021. Pertinent problems: 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); Chronic hip pain (2ry area of Pain) (Bilateral) (L>R);  Chronic shoulder arthropathy (Left); Trigger point of shoulder region (Left); Enthesopathy of hip region (Left); Groin pain, chronic, left; Other intervertebral disc degeneration, lumbar region; History of lumbar surgery; DDD (degenerative disc disease), lumbosacral; Neuropathic pain; Neurogenic pain; Spondylosis without myelopathy or radiculopathy, lumbosacral region; Chronic musculoskeletal pain; Cervical radiculitis (C4,C5,C8) (Left); DDD (degenerative disc disease), cervical; Foraminal stenosis of cervical region (C3-4) (Right); 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); Abnormal MRI, shoulder (03/20/2017); Chronic neck pain with history of cervical spinal surgery; Enthesopathy of shoulder (Left); Osteoarthritis of shoulder (Left); Symptoms referable to shoulder joint; Tendinopathy of rotator cuff (Left); Sprain of supraspinatus muscle or tendon, sequela (Left); Subdeltoid bursitis of shoulder (Left); Subacromial bursitis of shoulder (Left); Chronic shoulder pain (Left); Abnormal MRI, cervical spine (05/12/2020); Chronic upper back pain; Chronic shoulder pain (Right); Osteoarthritis of shoulder (Right); Arthropathy of shoulder (Right); C7 radiculopathy (Right); Cervical radiculopathy at C8 (Right); Muscle weakness of upper extremity (Right); Weakness of upper extremity (Right); Cervical fusion syndrome; and Failed back syndrome of cervical spine on their pertinent problem list. Pain Assessment: Severity of Chronic pain is reported as a 10-Worst pain ever/10. Location: Elbow Right/denies. Onset: More than a month ago. Quality: Aching, Other (Comment) (excruciating). Timing: Intermittent. Modifying factor(s): medications, heat, massage. Vitals:  height is _0  (1.626 m) and weight is 148 lb (67.1 kg). Her temporal temperature is 97.3 F (36.3 C) (abnormal). Her blood pressure is 154/94 (abnormal) and her pulse is 96. Her respiration is 16 and oxygen saturation is 99%.   Reason for encounter:  medication management.   The patient indicates doing well with the current medication regimen. No adverse reactions or side effects reported to the medications.   Today she comes in indicating that she still having pain in the area of the right shoulder.  I read the note by Ellard Artis, PA-C  and he did offer some physical therapy but apparently there is no need for surgery.  The patient also complains today of right elbow pain and wrist pain which based on her description could be either an elbow bursitis or it could be that she is still having some radicular symptoms of the upper extremity.  However, she has had anterior and posterior cervical fusions, and today I reviewed the images from her cervical MRI she does have some areas of central narrowing which along with the hardware would make it extremely difficult for her to do a spinal cord stimulator implant.  The same is true for the lumbar region.  In addition to this, the patient indicates being allergic to steroids and therefore I cannot offer her any epidural steroid injections or bursa injections because of her "allergy".  In addition, the nerve conduction test indicated that she does not seem to be having any electrodiagnostic evidence of radiculopathy.  She was recently found to have atrial fibrillation and she was placed on anticoagulation using Eliquis.  Unfortunately for her, there is not much that we can offer her except for managing her pain with the medications.  We have talked about the issues of tolerance in the past and she is currently on 31 MME's.  I will not be going any higher than this.  If she begins to complain of lack of effectiveness, the neck step will be a drug holiday to eliminate her tolerance and then we will put her back on the same dose or lower depending on how well she does with that drug holiday.  One 12/16/2020 we went over the results of her right shoulder MRI and nerve conduction test.  She was pending to see an orthopedic  surgeon the following Monday.  According to the (12/09/2020) RIGHT SHOULDER MRI impression: 1. Subscapularis muscle strain.  (The subscapularis is one of the 4 muscles which compose the rotator cuff. The subscapularis muscle's primary function is internal rotation but can also aid in adducting the humerus. The subscapularis nerve innervates the muscle.) 2. Intact rotator cuff. Mild supraspinatus and infraspinatus tendinosis. (These 2 muscles are also part of the rotator cuff apparatus.  The infraspinatus will assist with lateral rotation of the humerus, and external rotation of the shoulder.  There is supraspinatus will assist with abduction of the humerus and to some degree to lateral rotation of the humerus.) 3. Mild acromioclavicular and glenohumeral osteoarthritis.   The patient also completed the nerve conduction test (EMG/PNCV) of the right upper extremity on 12/08/2020.  The test was done at [facility] in [place].  The results of the test indicated that the test was a normal study. There is no evidence of carpal tunnel syndrome or cervical radiculopathy.  Not a good candidate for interventional therapy secondary to the following risks and considerations:   1. ELIQUIS ANTICOAGULATION. 2. CONTRAST & SHELLFISH ALLERGY.  3. Dexamethasone & Prednisone ALLERGY.  (Inability to use steroids)  4. NOT a candidate for lumbar facet RFA (due to HARDWARE). 5. NOT a candidate for membrane stabilizers (Neurontin/Lyrica) due to prior failed trials. 6. NOT a candidate for Lumbar SCS (due to HARDWARE). 7. NOT a candidate for IT Pump (patient's choice due to surgical risks). 8.  Not a candidate for cervical SCS (due to hardware and areas of central canal stenosis).  The patient was informed today of all of the above and the fact that we will simply stay with her medication management for now.  RTCB: 06/22/2021  Pharmacotherapy  Assessment  Analgesic: Morphine IR 15 mg 1 tab p.o. every 6 hours (60  mg/day of morphine) MME/day: 60 mg/day.   Monitoring: [place] PMP: PDMP reviewed during this encounter.       Pharmacotherapy: No side-effects or adverse reactions reported. Compliance: No problems identified. Effectiveness: Clinically acceptable.  Landis Martins, RN  03/24/2021 12:55 PM  Sign when Signing Visit Nursing Pain Medication Assessment:  Safety precautions to be maintained throughout the outpatient stay will include: orient to surroundings, keep bed in low position, maintain call bell within reach at all times, provide assistance with transfer out of bed and ambulation.  Medication Inspection Compliance: Pill count conducted under aseptic conditions, in front of the patient. Neither the pills nor the bottle was removed from the patient's sight at any time. Once count was completed pills were immediately returned to the patient in their original bottle.  Medication: Morphine IR Pill/Patch Count:  1 of 120 pills remain Pill/Patch Appearance: Markings consistent with prescribed medication Bottle Appearance: Standard pharmacy container. Clearly labeled. Filled Date: 16 / 06 / 2022 Last Medication intake:  Today    UDS:  Summary  Date Value Ref Range Status  08/18/2020 Note  Final    Comment:    ==================================================================== [employer] 13 (MW) ==================================================================== Test                             Result       Flag       Units  Drug Present and Declared for Prescription Verification   Alprazolam                     170          EXPECTED   ng/mg creat   Alpha-hydroxyalprazolam        484          EXPECTED   ng/mg creat    Source of alprazolam is a scheduled prescription medication. Alpha-    hydroxyalprazolam is an expected metabolite of alprazolam.    Morphine                       >9524        EXPECTED   ng/mg creat   Normorphine                    757          EXPECTED   ng/mg creat     Potential sources of large amounts of morphine in the absence of    codeine include administration of morphine or use of heroin.     Normorphine is an expected metabolite of morphine.    Hydromorphone                  650          EXPECTED   ng/mg creat    Hydromorphone may be present as a metabolite of morphine;    concentrations of hydromorphone rarely exceed 5% of the morphine    concentration when this is the source of hydromorphone.  ==================================================================== Test                      Result    Flag   Units      Ref Range   Creatinine              105  mg/dL      >=20 ==================================================================== Declared Medications:  The flagging and interpretation on this report are based on the  following declared medications.  Unexpected results may arise from  inaccuracies in the declared medications.   **Note: The testing scope of this panel includes these medications:   Alprazolam (Xanax)  Morphine (MSIR)   **Note: The testing scope of this panel does not include the  following reported medications:   Acetaminophen (Tylenol)  Amiodarone  Apixaban (Eliquis)  Buspirone (Buspar)  Dicyclomine (Bentyl)  Eye Drops  Linaclotide (Linzess)  Menthol  Montelukast (Singulair)  Ondansetron (Zofran)  Pantoprazole (Protonix)  Polyethylene Glycol (MiraLAX)  Sodium Chloride  Valacyclovir (Valtrex) ==================================================================== For clinical consultation, please call [phone]. ====================================================================      ROS  Constitutional: Denies any fever or chills Gastrointestinal: No reported hemesis, hematochezia, vomiting, or acute GI distress Musculoskeletal: Denies any acute onset joint swelling, redness, loss of ROM, or weakness Neurological: No reported episodes of acute onset apraxia, aphasia, dysarthria,  agnosia, amnesia, paralysis, loss of coordination, or loss of consciousness  Medication Review  ALPRAZolam, Menthol (Topical Analgesic), Pancreatin, apixaban, busPIRone, cephALEXin, diltiazem, linaclotide, lipase/protease/amylase, montelukast, morphine, naphazoline-pheniramine, pantoprazole, phenazopyridine, polyethylene glycol, rifaximin, and sodium chloride  History Review  Allergy: [name] is allergic to amoxicillin, chlorhexidine gluconate, clindamycin, codeine, erythromycin, penicillin g, sulfa antibiotics, levofloxacin, shellfish allergy, decadron [dexamethasone], mangifera indica, papaya derivatives, betadine [povidone iodine], clarithromycin, other, povidone-iodine, and prednisone. Drug: [name]  reports current drug use. Alcohol:  reports no history of alcohol use. Tobacco:  reports that she has never smoked. She has never used smokeless tobacco. Social: [name]  reports that she has never smoked. She has never used smokeless tobacco. She reports current drug use. She reports that she does not drink alcohol. 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), Anemia, Anxiety, Asthma, Cephalalgia (08/18/2014), Cervical disc disease (08/18/2014), Chronic headaches, Chronic, continuous use of opioids, DDD (degenerative disc disease), Depression, Dissociative disorder, Dizziness (04/22/2013), Duodenal ulcer with hemorrhage and perforation ([place]) (04/27/2003), Foot drop, right, GERD (gastroesophageal reflux disease), H/O arthrodesis (08/18/2014), Headache(784.0), History of blood transfusion, History of cardiac cath, History of cardioversion, History of cervical spinal surgery (01/04/2015), History of kidney stones, Hypertension, Inverted T wave, Iron deficiency (02/01/2021), Mitral regurgitation, Narrowing of intervertebral disc space (08/18/2014), Orthostatic hypotension  (04/22/2013), Pain, Paroxysmal atrial fibrillation ([place]) (2021), Pneumonia, PONV (postoperative nausea and vomiting), Postop Hyponatremia (05/14/2012), Postoperative anemia due to acute blood loss (05/14/2012), PTSD (post-traumatic stress disorder), Right foot drop, Right hip arthralgia (08/18/2014), Sleep apnea (2021), Therapeutic opioid-induced constipation ([facility]), and Typical atrial flutter ([place]). Surgical: [name]  has a past surgical history that includes RIGHT HIP ARTHROSCOPY FOR LABRAL TEAR (ABOUT 2010); Anterior fusion cervical spine (MAY 2012); Nasal septum surgery (MARCH 2013); Back surgery (2009); Tonsillectomy; Abdominal hysterectomy; DIAGNOSTIC LAPAROSCOPIES - MULTIPLE FOR ENDOMETRIOSIS; Cholecystectomy; Hip arthroscopy (09/20/2011); Shoulder arthroscopy (05-06-12); Carpal tunnel release (05-06-12); Total hip arthroplasty (Right, 05/13/2012); Anterior cervical decomp/discectomy fusion (N/A, 10/30/2012); Posterior cervical fusion/foraminotomy (N/A, 04/16/2013); Breast reduction surgery (Bilateral, 06/2016); Colonoscopy with propofol (N/A, 01/26/2017); Esophagogastroduodenoscopy (egd) with propofol (N/A, 01/26/2017); Esophageal dilation (N/A, 01/26/2017); polypectomy (N/A, 01/26/2017); PH impedance study (N/A, 05/30/2017); Esophageal manometry (N/A, 05/30/2017); Radiofrequency ablation nerves; afib (05/2019); LEFT HEART CATH AND CORONARY ANGIOGRAPHY (Left, 06/10/2019); Cardioversion (N/A, 12/03/2019); Esophagogastroduodenoscopy (egd) with propofol (N/A, 01/30/2020); Reduction mammaplasty (Bilateral, 05/2016); Breast biopsy (Right, 2008); Breast excisional biopsy (Left, 1998); TEE without cardioversion (N/A, 08/05/2020); ATRIAL FIBRILLATION ABLATION (  N/A, 08/06/2020); Cardiac electrophysiology mapping and ablation; Esophagogastroduodenoscopy (egd) with propofol (N/A, 01/15/2021); and Colonoscopy with propofol (N/A, 01/16/2021). Family: family history includes Aneurysm in her maternal grandmother and mother; Anxiety  disorder in her grandchild; Bipolar disorder in her grandchild and sister; Breast cancer (age of onset: 56) in her paternal grandmother; Depression in her grandchild.  Laboratory Chemistry Profile   Renal Lab Results  Component Value Date   BUN 7 (L) 01/19/2021   CREATININE 0.75 01/19/2021   BCR 9 (L) 01/19/2021   GFRAA >60 11/26/2019   GFRNONAA >60 01/14/2021    Hepatic Lab Results  Component Value Date   AST 18 01/19/2021   ALT 16 01/19/2021   ALBUMIN 3.9 01/19/2021   ALKPHOS 135 (H) 01/19/2021   LIPASE 26 01/13/2021    Electrolytes Lab Results  Component Value Date   NA 140 01/19/2021   K 4.2 01/19/2021   CL 105 01/19/2021   CALCIUM 8.9 01/19/2021   MG 2.2 01/14/2021    Bone Lab Results  Component Value Date   25OHVITD1 23 (L) 11/11/2018   25OHVITD2 9.8 11/11/2018   25OHVITD3 13 11/11/2018    Inflammation (CRP: Acute Phase) (ESR: Chronic Phase) Lab Results  Component Value Date   CRP 12 (H) 11/11/2018   ESRSEDRATE 42 (H) 11/11/2018   LATICACIDVEN 1.0 01/14/2021         Note: Above Lab results reviewed.  Recent Imaging Review  DG ABD ACUTE 2+V W 1V CHEST CLINICAL DATA:  Tachycardia. Shortness of breath. Nausea. Previous ablation for atrial fibrillation.  EXAM: DG ABDOMEN ACUTE WITH 1 VIEW CHEST  COMPARISON:  01/14/2021 and 01/13/2021  FINDINGS: There is no evidence of dilated bowel loops or free intraperitoneal air. No radiopaque calculi or other significant radiographic abnormality is seen. Surgical clips again seen from prior cholecystectomy. Thoracolumbar spine fusion hardware and right hip prosthesis again noted.  Heart size and mediastinal contours are within normal limits. Both lungs are clear. Cervical spine fusion hardware is also noted.  IMPRESSION: Unremarkable bowel gas pattern.  No acute findings.  No active cardiopulmonary disease.  Electronically Signed   By: Marlaine Hind M.D.   On: 01/15/2021 08:53 Note: Reviewed         Physical Exam  General appearance: Well nourished, well developed, and well hydrated. In no apparent acute distress Mental status: Alert, oriented x 3 (person, place, & time)       Respiratory: No evidence of acute respiratory distress Eyes: PERLA Vitals: BP (!) 154/94    Pulse 96    Temp (!) 97.3 F (36.3 C) (Temporal)    Resp 16    Ht _0  (1.626 m)    Wt 148 lb (67.1 kg)    SpO2 99%    BMI 25.40 kg/m  BMI: Estimated body mass index is 25.4 kg/m as calculated from the following:   Height as of this encounter: _1  (1.626 m).   Weight as of this encounter: 148 lb (67.1 kg). Ideal: Ideal body weight: 54.7 kg (120 lb 9.5 oz) Adjusted ideal body weight: 59.7 kg (131 lb 8.9 oz)  Assessment   Status Diagnosis  Controlled Controlled Controlled 1. Chronic low back pain (1ry area of Pain) (Bilateral) (L>R) w/o sciatica   2. 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 Chronic hip pain (2ry area of Pain) (Bilateral) (L>R)   4. Chronic shoulder pain (3ry area of Pain) (Bilateral) (L>R)   5. Chronic neck pain with history of cervical spinal  surgery   6. Failed back syndrome of cervical spine   7. Chronic musculoskeletal pain   8. Chronic pain syndrome   9. Pharmacologic therapy   10. Chronic use of opiate for therapeutic purpose   11. Encounter for medication management   12. Encounter for chronic pain management      Updated Problems: Problem  Atrial Fibrillation With Rvr ([facility])  Iron Deficiency  Skin Abnormalities  Epigastric Abdominal Pain  Intractable Nausea and Vomiting  Ileus ([facility])  Enteritis  Chest Pain  Acute Postoperative Pain (Resolved)    Plan of Care  Problem-specific:  No problem-specific Assessment & Plan notes found for this encounter.  [name] has a current medication list which includes the following long-term medication(s): apixaban, diltiazem, diltiazem, linaclotide, montelukast, pantoprazole, morphine, [START ON 04/23/2021]  morphine, and [START ON 05/23/2021] morphine.  Pharmacotherapy (Medications Ordered): Meds ordered this encounter  Medications   morphine (MSIR) 15 MG tablet    Sig: Take 1 tablet (15 mg total) by mouth every 6 (six) hours as needed for moderate pain or severe pain. Must last 30 days.    Dispense:  120 tablet    Refill:  0    DO NOT: delete (not duplicate); no partial-fill (will deny script to complete), no refill request (F/U required). DISPENSE: 1 day early if closed on fill date. WARN: No CNS-depressants within 8 hrs of med.   morphine (MSIR) 15 MG tablet    Sig: Take 1 tablet (15 mg total) by mouth every 6 (six) hours as needed for moderate pain or severe pain. Must last 30 days.    Dispense:  120 tablet    Refill:  0    DO NOT: delete (not duplicate); no partial-fill (will deny script to complete), no refill request (F/U required). DISPENSE: 1 day early if closed on fill date. WARN: No CNS-depressants within 8 hrs of med.   morphine (MSIR) 15 MG tablet    Sig: Take 1 tablet (15 mg total) by mouth every 6 (six) hours as needed for moderate pain or severe pain. Must last 30 days.    Dispense:  120 tablet    Refill:  0    DO NOT: delete (not duplicate); no partial-fill (will deny script to complete), no refill request (F/U required). DISPENSE: 1 day early if closed on fill date. WARN: No CNS-depressants within 8 hrs of med.   Orders:  No orders of the defined types were placed in this encounter.  Follow-up plan:   Return in about 3 months (around 06/22/2021) for Eval-day (M,W), (F2F), (MM).     Interventional Therapies  Risk   Complexity Considerations:   Estimated body mass index is 25.4 kg/m as calculated from the following:   Height as of this encounter: _0  (1.626 m).   Weight as of this encounter: 148 lb (67.1 kg). Risk   Complexity Considerations:   1. ELIQUIS ANTICOAGULATION: (Stop: 3 days   Restart: 6 hours) 2. CONTRAST & SHELLFISH ALLERGY.  3. Dexamethasone & Prednisone  ALLERGY.  (NO STEROIDS)  4. NOT a candidate for lumbar facet RFA (due to hardware). 5. NOT a candidate for membrane stabilizers (Neurontin/Lyrica) due to prior failed trials. 6. NOT a candidate for a lumbar SCS (due to HARDWARE). 7. NOT a candidate for IT Pump (patient's choice due to surgical risks). 8. NOT a candidate for a lcervical SCS (due to posterior fusion).       Planned   Pending:  Under consideration:   Diagnostic right suprascapular NB #3 (without steroids)    Completed (No Steroids):   Diagnostic bilateral lumbar facet MBB x2 (No Steroids)) (01/14/2019) (100/100/100/0)  Palliative bilateral suprascapular NB x2 (No Steroids) (06/19/2016) (80/80/0)  Palliative left suprascapular nerve RFA x1 (01/04/2017) (100/100/0. Reinjured shoulder on 02/01/2017)    Therapeutic   Palliative (PRN) options:   Diagnostic bilateral lumbar facet MBB #2  Palliative bilateral suprascapular NB #3 (No Steroids) Palliative left suprascapular nerve RFA #2     Recent Visits No visits were found meeting these conditions. Showing recent visits within past 90 days and meeting all other requirements Today's Visits Date Type Provider Dept  03/24/21 Office Visit Milinda Pointer, MD [facility]  Showing today's visits and meeting all other requirements Future Appointments Date Type Provider Dept  06/13/21 Appointment Milinda Pointer, MD [facility]  Showing future appointments within next 90 days and meeting all other requirements  I discussed the assessment and treatment plan with the patient. The patient was provided an opportunity to ask questions and all were answered. The patient agreed with the plan and demonstrated an understanding of the instructions.  Patient advised to call back or seek an in-person evaluation if the symptoms or condition worsens.  Duration of encounter: 30 minutes.  Note by: Gaspar Cola, MD Date: 03/24/2021; Time: 1:18 PM

## 2021-03-24 NOTE — Progress Notes (Signed)
Cystoscopy Procedure Note:  Indication: Gross hematuria, recurrent UTI, history of interstitial cystitis  After informed consent and discussion of the procedure and its risks, [name] was positioned and prepped in the standard fashion. Cystoscopy was performed with a flexible cystoscope. The urethra, bladder neck and entire bladder was visualized in a standard fashion. The ureteral orifices were visualized in their normal location and orientation.  Bladder mucosa grossly normal throughout, no abnormalities on retroflexion  Imaging: CT abdomen and pelvis with contrast dated 01/14/2021 no urologic abnormalities  Findings: Normal cystoscopy  Assessment and Plan: I recommended topical estrogen cream for her recurrent infections and intermittent urgency, and we reviewed the genitourinary syndrome of menopause  RTC with me 6 months  Nickolas Madrid, MD 03/24/2021

## 2021-03-24 NOTE — Patient Instructions (Signed)

## 2021-03-25 LAB — MICROSCOPIC EXAMINATION: Bacteria, UA: NONE SEEN

## 2021-03-25 LAB — URINALYSIS, COMPLETE
Bilirubin, UA: NEGATIVE
Glucose, UA: NEGATIVE
Leukocytes,UA: NEGATIVE
Nitrite, UA: NEGATIVE
Specific Gravity, UA: >1.03 — ABNORMAL HIGH (ref 1.005–1.030)
Urobilinogen, Ur: 2 mg/dL — ABNORMAL HIGH (ref 0.2–1.0)
pH, UA: 6 (ref 5.0–7.5)

## 2021-03-28 ENCOUNTER — Other Ambulatory Visit: Payer: Self-pay | Admitting: Nurse Practitioner

## 2021-03-29 NOTE — Progress Notes (Signed)
Electrophysiology Office Follow up Visit Note:    Date:  03/30/2021   ID:  [name], DOB 1960-08-16, MRN [id number]  PCP:  Shelby Crews, MD  [facility] Cardiologist:  Kate Sable, MD  [facility] Electrophysiologist:  Vickie Epley, MD    Interval History:    [name] is a 61 y.o. female who presents for a follow up visit. They were last seen in clinic November 10, 2020.  She previously underwent a PVI and CTI and posterior wall ablation on Aug 06, 2020.  She is previously been on Eliquis for stroke prophylaxis.  We stopped her amiodarone at the August appointment.    On March 15, 2021 there is a phone note that reported episodes of symptomatic atrial fibrillation.  Today she tells me she is doing well.  About 1 week ago she had her most recent episode of palpitations.  The last several hours and feels similar to her previous A. fib episodes.  This is still a very significant decrease in the burden of A. fib even off of amiodarone.  She thinks that the symptoms have improved since she received her final infusion of iron.  She continues to take Eliquis uninterrupted.       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    Chronic headaches    Chronic, continuous use of opioids    DDD (degenerative disc disease)    CERVICAL AND LUMBAR-CHRONIC PAIN, RT HIP LABRAL TEAR   Depression    PT STATES A LOT OF STRESS IN HER LIFE   Dissociative disorder    Dizziness 04/22/2013   Duodenal ulcer with hemorrhage and perforation ([place]) 04/27/2003   Foot drop, right    FROM BACK SURGERY   GERD  (gastroesophageal reflux disease)    H/O arthrodesis 08/18/2014   Headache(784.0)    AND NECK PAIN--STATES RECENT TEST SHOW CERVICAL DEGENERATION   History of blood transfusion    s/p back surgery   History of cardiac cath    a. 05/2019 Cath: Nl cors. EF 55-65%.   History of cardioversion    History of cervical spinal surgery 01/04/2015   History of kidney stones    Hypertension    Inverted T wave    Iron deficiency 02/01/2021   Mitral regurgitation    a. 07/2020 Echo: EF 60-65%, no rwma. Nl RV size/fxn. RVSP 39.20mmHg. Mildly to mod dil LA. Mod MR; b. 07/2020 TEE: EF 55-60%. Lambl's excresence. Nl RV size/fxn. Midly dil LA. No LA/LAA thrombus. Mild MR.   Narrowing of intervertebral disc space 08/18/2014   Currently on disability.    Orthostatic hypotension 04/22/2013   Pain    CHRONIC NECK AND BACK PAIN - LIMITED ROM NECK - S/P FUSIONS CERVICAL AND LUMBAR   Paroxysmal atrial fibrillation ([place]) 2021   a. 07/2020 s/p PVI.   Pneumonia    PONV (postoperative nausea and vomiting)    PT GIVES HX OF N&V AND FEVER WITH SURGERIES YEARS AGO--BUT NO PROBLEMS WITH MORE RECENT SURGERIES--STATES NOT MALIGNANT HYPERTHERMIA   Postop  Hyponatremia 05/14/2012   Postoperative anemia due to acute blood loss 05/14/2012   PTSD (post-traumatic stress disorder)    Right foot drop    Right hip arthralgia 08/18/2014   Status post surgery of right, and now needs left.    Sleep apnea 2021   does not have a cpap   Therapeutic opioid-induced constipation (OIC)    Typical atrial flutter [facility])     Past Surgical History:  Procedure Laterality Date   ABDOMINAL HYSTERECTOMY     afib  05/2019   ANTERIOR CERVICAL DECOMP/DISCECTOMY FUSION N/A 10/30/2012   Procedure: ACDF C5-6, EXPLORATION AND HARDWARE REMOVAL C6-7;  Surgeon: Melina Schools, MD;  Location: [place];  Service: Orthopedics;  Laterality: N/A;   ANTERIOR FUSION CERVICAL SPINE  MAY 2012   AT [place] N/A 08/06/2020   Procedure:  ATRIAL FIBRILLATION ABLATION;  Surgeon: Vickie Epley, MD;  Location: [place] [facility];  Service: Cardiovascular;  Laterality: N/A;   BACK SURGERY  2009   LUMBAR FUSION WITH RODS    BREAST BIOPSY Right 2008   benign.- bx/clip   BREAST EXCISIONAL BIOPSY Left 1998   benign   BREAST REDUCTION SURGERY Bilateral 06/2016   CARDIAC ELECTROPHYSIOLOGY MAPPING AND ABLATION     CARDIOVERSION N/A 12/03/2019   Procedure: CARDIOVERSION;  Surgeon: Kate Sable, MD;  Location: [facility];  Service: Cardiovascular;  Laterality: N/A;   CARPAL TUNNEL RELEASE  05-06-12   Right   CHOLECYSTECTOMY     COLONOSCOPY WITH PROPOFOL N/A 01/26/2017   Procedure: COLONOSCOPY WITH PROPOFOL;  Surgeon: Lucilla Lame, MD;  Location: [place];  Service: Endoscopy;  Laterality: N/A;   COLONOSCOPY WITH PROPOFOL N/A 01/16/2021   Procedure: COLONOSCOPY WITH PROPOFOL;  Surgeon: Lin Landsman, MD;  Location: [facility] ENDOSCOPY;  Service: Gastroenterology;  Laterality: N/A;   DIAGNOSTIC LAPAROSCOPIES - MULTIPLE FOR ENDOMETRIOSIS     ESOPHAGEAL DILATION N/A 01/26/2017   Procedure: ESOPHAGEAL DILATION;  Surgeon: Lucilla Lame, MD;  Location: [place];  Service: Endoscopy;  Laterality: N/A;   ESOPHAGEAL MANOMETRY N/A 05/30/2017   Procedure: ESOPHAGEAL MANOMETRY (EM);  Surgeon: Lucilla Lame, MD;  Location: [facility]OS[facility];  Service: Endoscopy;  Laterality: N/A;   ESOPHAGOGASTRODUODENOSCOPY (EGD) WITH PROPOFOL N/A 01/26/2017   Procedure: ESOPHAGOGASTRODUODENOSCOPY (EGD) WITH PROPOFOL;  Surgeon: Lucilla Lame, MD;  Location: [place];  Service: Endoscopy;  Laterality: N/A;   ESOPHAGOGASTRODUODENOSCOPY (EGD) WITH PROPOFOL N/A 01/30/2020   Procedure: ESOPHAGOGASTRODUODENOSCOPY (EGD) WITH PROPOFOL;  Surgeon: Lucilla Lame, MD;  Location: [place];  Service: Endoscopy;  Laterality: N/A;  sleep apnea COVID + 01-15-20   ESOPHAGOGASTRODUODENOSCOPY (EGD) WITH PROPOFOL N/A 01/15/2021   Procedure:  ESOPHAGOGASTRODUODENOSCOPY (EGD) WITH PROPOFOL;  Surgeon: Lin Landsman, MD;  Location: [place];  Service: Gastroenterology;  Laterality: N/A;   HIP ARTHROSCOPY  09/20/2011   Procedure: ARTHROSCOPY HIP;  Surgeon: Gearlean Alf, MD;  Location: [facility];  Service: Orthopedics;  Laterality: Right;  Right Hip Scope with Labral Debridement   LEFT HEART CATH AND CORONARY ANGIOGRAPHY Left 06/10/2019   Procedure: LEFT HEART CATH AND CORONARY ANGIOGRAPHY;  Surgeon: Nelva Bush, MD;  Location: [place] [facility];  Service: Cardiovascular;  Laterality: Left;   NASAL SEPTUM SURGERY  MARCH 2013   IN [place]   [facility] IMPEDANCE STUDY N/A 05/30/2017   Procedure: [place] IMPEDANCE STUDY;  Surgeon: Lucilla Lame, MD;  Location: [facility] ENDOSCOPY;  Service: Endoscopy;  Laterality: N/A;   POLYPECTOMY N/A 01/26/2017   Procedure: POLYPECTOMY;  Surgeon: Allen Norris,  Darren, MD;  Location: [place];  Service: Endoscopy;  Laterality: N/A;   POSTERIOR CERVICAL FUSION/FORAMINOTOMY N/A 04/16/2013   Procedure: REMOVAL CERVICAL PLATES AND INTERBODY CAGE/POSTERIOR CERVICAL SPINAL FUSION C4 - C6/C5 CORPECTOMY/C4 - C6 FUSION WITH ILIAC CREST BONE GRAFT;  Surgeon: Melina Schools, MD;  Location: [place];  Service: Orthopedics;  Laterality: N/A;   RADIOFREQUENCY ABLATION NERVES     REDUCTION MAMMAPLASTY Bilateral 05/2016   RIGHT HIP ARTHROSCOPY FOR LABRAL TEAR  ABOUT 2010   2012 also   SHOULDER ARTHROSCOPY  05-06-12   bone spur   TEE WITHOUT CARDIOVERSION N/A 08/05/2020   Procedure: TRANSESOPHAGEAL ECHOCARDIOGRAM (TEE);  Surgeon: Lelon Perla, MD;  Location: [facility];  Service: Cardiovascular;  Laterality: N/A;   TONSILLECTOMY     AS A CHILD   TOTAL HIP ARTHROPLASTY Right 05/13/2012   Procedure: TOTAL HIP ARTHROPLASTY ANTERIOR APPROACH;  Surgeon: Gearlean Alf, MD;  Location: [facility];  Service: Orthopedics;  Laterality: Right;    Current Medications: Current Meds  Medication Sig   ALPRAZolam (XANAX) 1 MG  tablet Take 1 mg by mouth at bedtime.    apixaban (ELIQUIS) 5 MG TABS tablet Take 1 tablet (5 mg total) by mouth 2 (two) times daily.   busPIRone (BUSPAR) 7.5 MG tablet Take 7.5 mg by mouth 2 (two) times daily.   CREON 36000-114000 units CPEP capsule Take 2 capsules with the first bite of each meal and 1 capsule with the first bite of each snack   diltiazem (CARDIZEM CD) 120 MG 24 hr capsule Take 1 capsule (120 mg total) by mouth daily.   diltiazem (CARDIZEM) 30 MG tablet TAKE 1 TABLET BY MOUTH EVERY 6 HOURS AS NEEDED   estradiol (ESTRACE) 0.1 MG/GM vaginal cream Estrogen Cream Instruction Discard applicator Apply pea sized amount to tip of finger to urethra before bed. Wash hands well after application. Use Monday, Wednesday and Friday   linaclotide (LINZESS) 145 MCG CAPS capsule Take 1 capsule (145 mcg total) by mouth daily before breakfast.   Menthol, Topical Analgesic, (BENGAY EX) Apply 1 application topically daily as needed (pain).   montelukast (SINGULAIR) 10 MG tablet TAKE 1 TABLET BY MOUTH EVERY DAY   morphine (MSIR) 15 MG tablet Take 1 tablet (15 mg total) by mouth every 6 (six) hours as needed for moderate pain or severe pain. Must last 30 days.   [START ON 04/23/2021] morphine (MSIR) 15 MG tablet Take 1 tablet (15 mg total) by mouth every 6 (six) hours as needed for moderate pain or severe pain. Must last 30 days.   [START ON 05/23/2021] morphine (MSIR) 15 MG tablet Take 1 tablet (15 mg total) by mouth every 6 (six) hours as needed for moderate pain or severe pain. Must last 30 days.   naphazoline-pheniramine (VISINE) 0.025-0.3 % ophthalmic solution 1 drop 4 (four) times daily as needed for eye irritation.   pantoprazole (PROTONIX) 40 MG tablet TAKE 1 TABLET BY MOUTH TWICE A DAY   polyethylene glycol (MIRALAX / GLYCOLAX) 17 g packet Take 17 g by mouth daily.   rifaximin (XIFAXAN) 550 MG TABS tablet Take 1 tablet (550 mg total) by mouth 3 (three) times daily for 14 days.   sodium chloride  (OCEAN) 0.65 % SOLN nasal spray Place 1 spray into both nostrils daily as needed for congestion ([place]).     Allergies:   Amoxicillin, Chlorhexidine gluconate, Clindamycin, Codeine, Erythromycin, Penicillin g, Sulfa antibiotics, Levofloxacin, Shellfish allergy, Decadron [dexamethasone], Mangifera indica, Papaya derivatives, Betadine [povidone iodine], Clarithromycin,  Other, Povidone-iodine, and Prednisone   Social History   Socioeconomic History   Marital status: Married    Spouse name: bruce   Number of children: 2   Years of education: Not on file   Highest education level: Associate degree: occupational, Hotel manager, or vocational program  Occupational History    Comment: disabled  Tobacco Use   Smoking status: Never   Smokeless tobacco: Never  Vaping Use   Vaping Use: Never used  Substance and Sexual Activity   Alcohol use: No    Alcohol/week: 0.0 standard drinks   Drug use: Yes    Comment: prescribed morphine and xanax   Sexual activity: Yes  Other Topics Concern   Not on file  Social History Narrative   Son, daughter  And 4 grandkids; raises 3 of the grandchildren      Lives in [place]; with husband; never smoked; no alcohol; 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    Social Determinants of Health   Financial Resource Strain: Low Risk    Difficulty of Paying Living Expenses: Not hard at all  Food Insecurity: No Food Insecurity   Worried About Charity fundraiser in the Last Year: Never true   [place] in the Last Year: Never true  Transportation Needs: No Transportation Needs   Lack of Transportation (Medical): No   Lack of Transportation (Non-Medical): No  Physical Activity: Insufficiently Active   Days of Exercise per Week: 2 days   Minutes of Exercise per Session: 20 min  Stress: No Stress Concern Present   Feeling of Stress : Only a little  Social Connections: Moderately Integrated   Frequency of Communication with Friends and Family: More than three times a week    Frequency of Social Gatherings with Friends and Family: Never   Attends Religious Services: 1 to 4 times per year   Active Member of [employer] or Organizations: No   Attends Archivist Meetings: Never   Marital Status: Married     Family History: The patient's family history includes Aneurysm in her maternal grandmother and mother; Anxiety disorder in her grandchild; Bipolar disorder in her grandchild and sister; Breast cancer (age of onset: 78) in her paternal grandmother; Depression in her grandchild. There is no history of Cancer.  ROS:   Please see the history of present illness.    All other systems reviewed and are negative.  EKGs/Labs/Other Studies Reviewed:    The following studies were reviewed today:   EKG:  The ekg ordered today demonstrates sinus rhythm.  Recent Labs: 01/14/2021: Hemoglobin 11.1; Magnesium 2.2; Platelets 313 01/19/2021: ALT 16; BUN 7; Creatinine, Ser 0.75; Potassium 4.2; Sodium 140  Recent Lipid Panel    Component Value Date/Time   CHOL 138 01/19/2021 1055   TRIG 162 (H) 01/19/2021 1055   HDL 49 01/19/2021 1055   CHOLHDL 2.9 01/13/2020 1117   LDLCALC 62 01/19/2021 1055    Physical Exam:    VS:  BP 120/90 (BP Location: Left Arm, Patient Position: Sitting, Cuff Size: Normal)    Pulse 68    Ht 5\' 4"  (1.626 m)    Wt 149 lb (67.6 kg)    SpO2 98%    BMI 25.58 kg/m     Wt Readings from Last 3 Encounters:  03/30/21 149 lb (67.6 kg)  03/24/21 148 lb (67.1 kg)  03/24/21 148 lb (67.1 kg)     GEN:  Well nourished, well developed in no acute distress HEENT: Normal NECK: No JVD; No  carotid bruits LYMPHATICS: No lymphadenopathy CARDIAC: RRR, no murmurs, rubs, gallops RESPIRATORY:  Clear to auscultation without rales, wheezing or rhonchi  ABDOMEN: Soft, non-tender, non-distended MUSCULOSKELETAL:  No edema; No deformity  SKIN: Warm and dry NEUROLOGIC:  Alert and oriented x 3 PSYCHIATRIC:  Normal affect        ASSESSMENT:    1. Persistent  atrial fibrillation ([place])   2. Typical atrial flutter ([place])   3. Primary hypertension    PLAN:    In order of problems listed above:  #Persistent atrial fibrillation Doing well.  Very very low burden of palpitations that seem like there paroxysms of atrial fibrillation.  This is in the absence of amiodarone.  I would like to order a 2-week ZIO monitor just to confirm that we are not missing a large burden of atrial fibrillation that would necessitate alternative rhythm control strategies.  If there is very little burden of A. fib, I would continue with current regimen/plan.  I will have her follow-up with an APP in 6 months or sooner based on the results of the Ziehl monitor.  #Hypertension Controlled Continue current regimen   Medication Adjustments/Labs and Tests Ordered: Current medicines are reviewed at length with the patient today.  Concerns regarding medicines are outlined above.  No orders of the defined types were placed in this encounter.  No orders of the defined types were placed in this encounter.    Signed, Lars Mage, MD, [facility], [facility] 03/30/2021 9:39 AM    Electrophysiology [facility]

## 2021-03-30 ENCOUNTER — Ambulatory Visit: Payer: Medicare Other | Admitting: Cardiology

## 2021-03-30 ENCOUNTER — Encounter: Payer: Self-pay | Admitting: Cardiology

## 2021-03-30 ENCOUNTER — Other Ambulatory Visit: Payer: Self-pay

## 2021-03-30 ENCOUNTER — Encounter: Payer: Medicare Other | Admitting: Pain Medicine

## 2021-03-30 ENCOUNTER — Ambulatory Visit: Payer: Medicare Other

## 2021-03-30 VITALS — BP 120/90 | HR 68 | Ht 64.0 in | Wt 149.0 lb

## 2021-03-30 DIAGNOSIS — I4819 Other persistent atrial fibrillation: Secondary | ICD-10-CM

## 2021-03-30 DIAGNOSIS — I483 Typical atrial flutter: Secondary | ICD-10-CM

## 2021-03-30 DIAGNOSIS — I10 Essential (primary) hypertension: Secondary | ICD-10-CM | POA: Diagnosis not present

## 2021-03-30 NOTE — Patient Instructions (Addendum)
Your physician recommends that you continue on your current medications as directed. Please refer to the Current Medication list given to you today. *If you need a refill on your cardiac medications before your next appointment, please call your pharmacy*  Lab Work: None. If you have labs (blood work) drawn today and your tests are completely normal, you will receive your results only by: [place] (if you have MyChart) OR A paper copy in the mail If you have any lab test that is abnormal or we need to change your treatment, we will call you to review the results.  Testing/Procedures: Your physician has recommended that you wear a heart monitor. Heart monitors are medical devices that record the hearts electrical activity. Doctors most often use these monitors to diagnose arrhythmias. Arrhythmias are problems with the speed or rhythm of the heartbeat. The monitor is a small, portable device. You can wear one while you do your normal daily activities. This is usually used to diagnose what is causing palpitations/syncope (passing out).   Follow-Up: At [facility], you and your health needs are our priority.  As part of our continuing mission to provide you with exceptional heart care, we have created designated Provider Care Teams.  These Care Teams include your primary Cardiologist (physician) and Advanced Practice Providers (APPs -  Physician Assistants and Nurse Practitioners) who all work together to provide you with the care you need, when you need it.  Your physician wants you to follow-up in: 6 months with one of the following Advanced Practice Providers on your designated Care Team:    Ignacia Bayley, NP Christell Faith PA Cadence Kathlen Mody PA    You will receive a reminder letter in the mail two months in advance. If you don't receive a letter, please call our office to schedule the follow-up appointment.  We recommend signing up for the patient portal called "MyChart".  Sign up  information is provided on this After Visit Summary.  MyChart is used to connect with patients for Virtual Visits (Telemedicine).  Patients are able to view lab/test results, encounter notes, upcoming appointments, etc.  Non-urgent messages can be sent to your provider as well.   To learn more about what you can do with MyChart, go to [email or web address].    Any Other Special Instructions Will Be Listed Below (If Applicable).  ZIO XT- Long Term Monitor Instructions  Your physician has requested you wear a ZIO patch monitor for 14 days.  This is a single patch monitor. [employer] supplies one patch monitor per enrollment. Additional stickers are not available. Please do not apply patch if you will be having a Nuclear Stress Test,  Echocardiogram, Cardiac CT, MRI, or Chest Xray during the period you would be wearing the  monitor. The patch cannot be worn during these tests. You cannot remove and re-apply the  ZIO XT patch monitor.  Your ZIO patch monitor will be mailed 3 day USPS to your address on file. It may take 3-5 days  to receive your monitor after you have been enrolled.  Once you have received your monitor, please review the enclosed instructions. Your monitor  has already been registered assigning a specific monitor serial # to you.  Billing and Patient Assistance Program Information  We have supplied [employer] with any of your insurance information on file for billing purposes. [employer] offers a sliding scale Patient Assistance Program for patients that do not have  insurance, or whose insurance does not completely cover the cost of the ZIO  monitor.  You must apply for the Patient Assistance Program to qualify for this discounted rate.  To apply, please call [employer] at [phone], select option 4, select option 2, ask to apply for  Patient Assistance Program. Theodore Demark will ask your household income, and how many people  are in your household. They will quote your out-of-pocket cost  based on that information.  [employer] will also be able to set up a 72-month, interest-free payment plan if needed.  Applying the monitor   Shave hair from upper left chest.  Hold abrader disc by orange tab. Rub abrader in 40 strokes over the upper left chest as  indicated in your monitor instructions.  Clean area with 4 enclosed alcohol pads. Let dry.  Apply patch as indicated in monitor instructions. Patch will be placed under collarbone on left  side of chest with arrow pointing upward.  Rub patch adhesive wings for 2 minutes. Remove white label marked "1". Remove the white  label marked "2". Rub patch adhesive wings for 2 additional minutes.  While looking in a mirror, press and release button in center of patch. A small green light will  flash 3-4 times. This will be your only indicator that the monitor has been turned on.  Do not shower for the first 24 hours. You may shower after the first 24 hours.  Press the button if you feel a symptom. You will hear a small click. Record Date, Time and  Symptom in the Patient Logbook.  When you are ready to remove the patch, follow instructions on the last 2 pages of Patient  Logbook. Stick patch monitor onto the last page of Patient Logbook.  Place Patient Logbook in the blue and white box. Use locking tab on box and tape box closed  securely. The blue and white box has prepaid postage on it. Please place it in the mailbox as  soon as possible. Your physician should have your test results approximately 7 days after the  monitor has been mailed back to [facility].  Call [place] at [phone] if you have questions regarding  your ZIO XT patch monitor. Call them immediately if you see an orange light blinking on your  monitor.  If your monitor falls off in less than 4 days, contact our Monitor department at [phone].  If your monitor becomes loose or falls off after 4 days call [employer] at [phone] for   suggestions on securing your monitor

## 2021-04-05 ENCOUNTER — Other Ambulatory Visit: Payer: Self-pay

## 2021-04-05 MED ORDER — DILTIAZEM HCL ER COATED BEADS 120 MG PO CP24: 120.0000 mg | ORAL_CAPSULE | Freq: Every day | ORAL | 0 refills | Status: DC

## 2021-04-12 ENCOUNTER — Telehealth: Payer: Self-pay | Admitting: Cardiology

## 2021-04-12 ENCOUNTER — Other Ambulatory Visit: Payer: Self-pay

## 2021-04-12 MED ORDER — APIXABAN 5 MG PO TABS: 5.0000 mg | ORAL_TABLET | Freq: Two times a day (BID) | ORAL | 5 refills | Status: DC

## 2021-04-12 NOTE — Telephone Encounter (Signed)
Received faxed refill request for [name] from [facility] in [place].  Pt last saw Dr Quentin Ore 03/30/21, last labs 01/19/21 Creat 0.75, age 61, weight 67.6kg, based on specified criteria pt is on appropriate dosage of [name] 5mg  BID for afib.  Will refill rx.

## 2021-04-12 NOTE — Telephone Encounter (Signed)
°  1. Is this related to a heart monitor you are wearing?  (If the patient says no, please ask     if they are caling about ICD/pacemaker.)   Zio - ordered but never received   2. What is your issue?? Patient never received monitor and wants to discuss    (If the patient is calling for results of the heart monitor this     message should be sent to nurse.)   Please route to covering RN/CMA/RMA for results. Route to monitor technicians or your monitor tech representative for your site for any technical concerns

## 2021-04-13 ENCOUNTER — Other Ambulatory Visit: Payer: Self-pay | Admitting: *Deleted

## 2021-04-13 ENCOUNTER — Telehealth: Payer: Self-pay

## 2021-04-13 ENCOUNTER — Ambulatory Visit (INDEPENDENT_AMBULATORY_CARE_PROVIDER_SITE_OTHER): Payer: Medicare Other

## 2021-04-13 DIAGNOSIS — I4819 Other persistent atrial fibrillation: Secondary | ICD-10-CM

## 2021-04-13 DIAGNOSIS — K8681 Exocrine pancreatic insufficiency: Secondary | ICD-10-CM

## 2021-04-13 NOTE — Telephone Encounter (Signed)
Ok with nutrition referral  Dx: EPI  RV

## 2021-04-13 NOTE — Progress Notes (Unsigned)
Enrolled for [employer] to mail a ZIO XT long term holter monitor to the patients address on file.  

## 2021-04-13 NOTE — Telephone Encounter (Signed)
Placed referral and left patient a detail message

## 2021-04-13 NOTE — Telephone Encounter (Signed)
Sent in new order for patient to have monitor mailed out.

## 2021-04-13 NOTE — Telephone Encounter (Signed)
Patient is wanting a referral to a nutrients for her pancreatic insufficiency

## 2021-04-13 NOTE — Addendum Note (Signed)
Addended by: Ulyess Blossom L on: 04/13/2021 11:55 AM   Modules accepted: Orders

## 2021-04-15 DIAGNOSIS — I4819 Other persistent atrial fibrillation: Secondary | ICD-10-CM

## 2021-04-20 ENCOUNTER — Telehealth: Payer: Self-pay

## 2021-04-20 NOTE — Telephone Encounter (Signed)
Decrease Creon to 1 capsule 1-2 times daily and gradually increase according to her symptoms  RV

## 2021-04-20 NOTE — Telephone Encounter (Signed)
Pt called and stated she is currently taking Creon. Pt has started feeling nausea over the last few days. She has not been able to eat much because every time she does eat she gets the dry heaves and severe nausea. Also started getting the chills. Please advise. She has had the Nissen's fundoplication so she cannot vomit.

## 2021-04-21 DIAGNOSIS — G4733 Obstructive sleep apnea (adult) (pediatric): Secondary | ICD-10-CM | POA: Diagnosis not present

## 2021-04-21 NOTE — Telephone Encounter (Signed)
Patient verbalized understanding of results  

## 2021-04-22 ENCOUNTER — Encounter: Payer: Self-pay | Admitting: Gastroenterology

## 2021-04-22 DIAGNOSIS — K8681 Exocrine pancreatic insufficiency: Secondary | ICD-10-CM

## 2021-04-26 ENCOUNTER — Other Ambulatory Visit: Payer: Self-pay | Admitting: *Deleted

## 2021-04-26 ENCOUNTER — Other Ambulatory Visit: Payer: Self-pay

## 2021-04-26 DIAGNOSIS — E611 Iron deficiency: Secondary | ICD-10-CM

## 2021-04-26 DIAGNOSIS — K8681 Exocrine pancreatic insufficiency: Secondary | ICD-10-CM

## 2021-04-26 NOTE — Telephone Encounter (Signed)
Called the [facility] dietitian and they say dr. Annamaria Boots is at there office and we can fax referral to them at [phone] faxed snap shot, last office visit, insurance

## 2021-05-04 ENCOUNTER — Encounter: Payer: Self-pay | Admitting: Internal Medicine

## 2021-05-04 ENCOUNTER — Inpatient Hospital Stay: Payer: Medicare Other | Admitting: Internal Medicine

## 2021-05-04 ENCOUNTER — Inpatient Hospital Stay: Payer: Medicare Other | Attending: Internal Medicine

## 2021-05-04 ENCOUNTER — Other Ambulatory Visit: Payer: Self-pay

## 2021-05-04 ENCOUNTER — Inpatient Hospital Stay: Payer: Medicare Other

## 2021-05-04 DIAGNOSIS — Z803 Family history of malignant neoplasm of breast: Secondary | ICD-10-CM | POA: Diagnosis not present

## 2021-05-04 DIAGNOSIS — E611 Iron deficiency: Secondary | ICD-10-CM

## 2021-05-04 DIAGNOSIS — K8689 Other specified diseases of pancreas: Secondary | ICD-10-CM | POA: Insufficient documentation

## 2021-05-04 DIAGNOSIS — I4891 Unspecified atrial fibrillation: Secondary | ICD-10-CM | POA: Insufficient documentation

## 2021-05-04 DIAGNOSIS — Z7901 Long term (current) use of anticoagulants: Secondary | ICD-10-CM | POA: Diagnosis not present

## 2021-05-04 DIAGNOSIS — K2970 Gastritis, unspecified, without bleeding: Secondary | ICD-10-CM | POA: Insufficient documentation

## 2021-05-04 DIAGNOSIS — G8929 Other chronic pain: Secondary | ICD-10-CM | POA: Diagnosis not present

## 2021-05-04 DIAGNOSIS — M549 Dorsalgia, unspecified: Secondary | ICD-10-CM | POA: Diagnosis not present

## 2021-05-04 DIAGNOSIS — D509 Iron deficiency anemia, unspecified: Secondary | ICD-10-CM | POA: Insufficient documentation

## 2021-05-04 LAB — CBC WITH DIFFERENTIAL/PLATELET
Abs Immature Granulocytes: 0.02 10*3/uL (ref 0.00–0.07)
Basophils Absolute: 0.1 10*3/uL (ref 0.0–0.1)
Basophils Relative: 1 %
Eosinophils Absolute: 0.1 10*3/uL (ref 0.0–0.5)
Eosinophils Relative: 1 %
HCT: 38.4 % (ref 36.0–46.0)
Hemoglobin: 13.2 g/dL (ref 12.0–15.0)
Immature Granulocytes: 0 %
Lymphocytes Relative: 35 %
Lymphs Abs: 2 10*3/uL (ref 0.7–4.0)
MCH: 28.9 pg (ref 26.0–34.0)
MCHC: 34.4 g/dL (ref 30.0–36.0)
MCV: 84 fL (ref 80.0–100.0)
Monocytes Absolute: 0.3 10*3/uL (ref 0.1–1.0)
Monocytes Relative: 5 %
Neutro Abs: 3.2 10*3/uL (ref 1.7–7.7)
Neutrophils Relative %: 58 %
Platelets: 324 10*3/uL (ref 150–400)
RBC: 4.57 MIL/uL (ref 3.87–5.11)
RDW: 13.9 % (ref 11.5–15.5)
WBC: 5.5 10*3/uL (ref 4.0–10.5)
nRBC: 0 % (ref 0.0–0.2)

## 2021-05-04 LAB — BASIC METABOLIC PANEL
Anion gap: 9 (ref 5–15)
BUN: 11 mg/dL (ref 8–23)
CO2: 25 mmol/L (ref 22–32)
Calcium: 9.3 mg/dL (ref 8.9–10.3)
Chloride: 101 mmol/L (ref 98–111)
Creatinine, Ser: 0.72 mg/dL (ref 0.44–1.00)
GFR, Estimated: >60 mL/min (ref >60)
Glucose, Bld: 116 mg/dL — ABNORMAL HIGH (ref 70–99)
Potassium: 3.8 mmol/L (ref 3.5–5.1)
Sodium: 135 mmol/L (ref 135–145)

## 2021-05-04 LAB — IRON AND TIBC
Iron: 126 ug/dL (ref 28–170)
Saturation Ratios: 29 % (ref 10.4–31.8)
TIBC: 428 ug/dL (ref 250–450)
UIBC: 302 ug/dL

## 2021-05-04 LAB — FERRITIN: Ferritin: 71 ng/mL (ref 11–307)

## 2021-05-04 MED ORDER — DILTIAZEM HCL ER COATED BEADS 120 MG PO CP24: 120.0000 mg | ORAL_CAPSULE | Freq: Every day | ORAL | 1 refills | Status: DC

## 2021-05-04 NOTE — Progress Notes (Signed)
[facility] NOTE  Patient Care Team: Brita Romp Dionne Bucy, MD as PCP - General (Family Medicine) Kate Sable, MD as PCP - Cardiology (Cardiology) Vickie Epley, MD as PCP - Electrophysiology (Cardiology) Dorann Ou Montine Circle, NP as Nurse Practitioner (Psychiatry) Lucilla Lame, MD as Consulting Physician (Gastroenterology) Jules Husbands, MD as Consulting Physician (General Surgery) Milinda Pointer, MD as Referring Physician (Pain Medicine) Pcp, No Vivianne Master, Freddi Che, DMD (Dentistry) Neldon Labella, RN as Case Manager  CHIEF COMPLAINTS/PURPOSE OF CONSULTATION: ANEMIA  HEMATOLOGY HISTORY:  #Iron deficiency ANEMIA EGD-gastritis/ colonoscopy- lymphoplasmacytic infiltrateNovember 2022 [Dr. Marius Ditch; in hospital]; capsule/Bone marrow Biopsy-none; [history of chronic iron deficiency anemia- s/p IV iron infusions in the past]; CTA CAP: Negative for any source of bleeding noted.  Atrophic pancreas-?  Etiology;   #Question pancreatic insufficiency-on Creon [nov [id number]]  #A. Fib [s/p cardioversion-Eliqu[facility] Dr.Agbor]; #Chronic back pain/pain management    Latest Reference Range & Units 01/14/21 06:15  Iron 28 - 170 ug/dL 30  UIBC ug/dL 502  TIBC 250 - 450 ug/dL 532 (H)  Saturation Ratios 10.4 - 31.8 % 6 (L)  Ferritin 11 - 307 ng/mL 7 (L)  Folate >5.9 ng/mL 14.9    HISTORY OF PRESENTING ILLNESS: Alone ambulating independently.  [name] 61 y.o.  female iron deficient anemia unclear etiology is here for follow-up.  Patient s/p IV iron infusions.  Notes her improvement of energy levels.   Review of Systems  Constitutional:  Positive for malaise/fatigue. Negative for chills, diaphoresis, fever and weight loss.  HENT:  Negative for nosebleeds and sore throat.   Eyes:  Negative for double vision.  Respiratory:  Negative for cough, hemoptysis, sputum production, shortness of breath and wheezing.   Cardiovascular:  Negative for chest pain, palpitations,  orthopnea and leg swelling.  Gastrointestinal:  Positive for diarrhea and nausea. Negative for abdominal pain, blood in stool, constipation, heartburn, melena and vomiting.  Genitourinary:  Negative for dysuria, frequency and urgency.  Musculoskeletal:  Negative for back pain and joint pain.  Skin: Negative.  Negative for itching and rash.  Neurological:  Negative for dizziness, tingling, focal weakness, weakness and headaches.  Endo/Heme/Allergies:  Does not bruise/bleed easily.  Psychiatric/Behavioral:  Negative for depression. The patient is not nervous/anxious and does not have insomnia.    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    Chronic headaches    Chronic, continuous use of opioids    DDD (degenerative disc disease)    CERVICAL AND LUMBAR-CHRONIC PAIN, RT HIP LABRAL TEAR   Depression    PT STATES A LOT OF STRESS IN HER LIFE   Dissociative disorder    Dizziness 04/22/2013   Duodenal ulcer with hemorrhage and perforation ([place]) 04/27/2003   Foot drop, right    FROM BACK SURGERY   GERD (gastroesophageal reflux disease)    H/O arthrodesis 08/18/2014   Headache(784.0)    AND NECK PAIN--STATES RECENT TEST SHOW CERVICAL DEGENERATION   History of blood transfusion    s/p back surgery   History of cardiac cath  a. 05/2019 Cath: Nl cors. EF 55-65%.   History of cardioversion    History of cervical spinal surgery 01/04/2015   History of kidney stones    Hypertension    Inverted T wave    Iron deficiency 02/01/2021   Mitral regurgitation    a. 07/2020 Echo: EF  60-65%, no rwma. Nl RV size/fxn. RVSP 39.15mHg. Mildly to mod dil LA. Mod MR; b. 07/2020 TEE: EF 55-60%. Lambl's excresence. Nl RV size/fxn. Midly dil LA. No LA/LAA thrombus. Mild MR.   Narrowing of intervertebral disc space 08/18/2014   Currently on disability.    Orthostatic hypotension 04/22/2013   Pain    CHRONIC NECK AND BACK PAIN - LIMITED ROM NECK - S/P FUSIONS CERVICAL AND LUMBAR   [place]oxysmal atrial fibrillation ([place]   a. 07/2020 s/p PVI.   Pneumonia    PONV (postoperative nausea and vomiting)    PT GIVES HX OF N&V AND FEVER WITH SURGERIES YEARS AGO--BUT NO PROBLEMS WITH MORE RECENT SURGERIES--STATES NOT MALIGNANT HYPERTHERMIA   Postop Hyponatremia 05/14/2012   Postoperative anemia due to acute blood loss 05/14/2012   PTSD (post-traumatic stress disorder)    Right foot drop    Right hip arthralgia 08/18/2014   Status post surgery of right, and now needs left.    Sleep apnea 2021   does not have a cpap   Therapeutic opioid-induced constipation (OIC)    Typical atrial flutter (HCC)     SURGICAL HISTORY: Past Surgical History:  Procedure Laterality Date   ABDOMINAL HYSTERECTOMY     afib  05/2019   ANTERIOR CERVICAL DECOMP/DISCECTOMY FUSION N/A 10/30/2012   Procedure: ACDF C5-6, EXPLORATION AND HARDWARE REMOVAL C6-7;  Surgeon: DMelina Schools MD;  [place]  Service: Orthopedics;  Laterality: N/A;   ANTERIOR FUSION CERVICAL SPINE  MAY 2012   [place] 08/[place]   Procedure: ATRIAL FIBRILLATION ABLATION;  Surgeon: LVickie Epley MD;  [place];  Service: Cardiovascular;  Laterality: N/A;   BACK SURGERY  2009   LUMBAR FUSION WITH RODS    BREAST BIOPSY Right 2008   benign.- bx/clip   BREAST EXCISIONAL BIOPSY Left 1998   benign   BREAST REDUCTION SURGERY Bilateral 06/2016   CARDIAC ELECTROPHYSIOLOGY MAPPING AND ABLATION     CARDIOVERSION N/A 12/03/2019   Procedure: CARDIOVERSION;   Surgeon: AKate Sable MD;  Location: [facility];  Service: Cardiovascular;  Laterality: N/A;   CARPAL TUNNEL RELEASE  05-06-12   Right   CHOLECYSTECTOMY     COLONOSCOPY WITH PROPOFOL N/A 01/26/2017   Procedure: COLONOSCOPY WITH PROPOFOL;  Surgeon: WLucilla Lame MD;  [place]  Service: Endoscopy;  Laterality: N/A;   COLONOSCOPY WITH PROPOFOL N/A 01/16/2021   Procedure: COLONOSCOPY WITH PROPOFOL;  Surgeon: VLin Landsman MD;  [facility];  Service: Gastroenterology;  Laterality: N/A;   DIAGNOSTIC LAPAROSCOPIES - MULTIPLE FOR ENDOMETRIOSIS     ESOPHAGEAL DILATION N/A 01/26/2017   Procedure: ESOPHAGEAL DILATION;  Surgeon: WLucilla Lame MD;  [place]  Service: Endoscopy;  Laterality: N/A;   ESOPHAGEAL MANOMETRY N/A 05/30/2017   Procedure: ESOPHAGEAL MANOMETRY (EM);  Surgeon: WLucilla Lame MD;  Location: [facility];  Service: Endoscopy;  Laterality: N/A;   ESOPHAGOGASTRODUODENOSCOPY (EGD) WITH PROPOFOL N/A 01/26/2017   Procedure: ESOPHAGOGASTRODUODENOSCOPY (EGD) WITH PROPOFOL;  Surgeon: WLucilla Lame MD;  [place]  Service: Endoscopy;  Laterality: N/A;   ESOPHAGOGASTRODUODENOSCOPY (EGD) WITH PROPOFOL N/A 01/30/2020   Procedure:  ESOPHAGOGASTRODUODENOSCOPY (EGD) WITH PROPOFOL;  Surgeon: Lucilla Lame, MD;  Location: [place];  Service: Endoscopy;  Laterality: N/A;  sleep apnea COVID + 01-15-20   ESOPHAGOGASTRODUODENOSCOPY (EGD) WITH PROPOFOL N/A 01/15/2021   Procedure: ESOPHAGOGASTRODUODENOSCOPY (EGD) WITH PROPOFOL;  Surgeon: Lin Landsman, MD;  Location: [place];  Service: Gastroenterology;  Laterality: N/A;   HIP ARTHROSCOPY  09/20/2011   Procedure: ARTHROSCOPY HIP;  Surgeon: Gearlean Alf, MD;  [facility];  Service: Orthopedics;  Laterality: Right;  Right Hip Scope with Labral Debridement   LEFT HEART CATH AND CORONARY ANGIOGRAPHY Left 06/10/2019   Procedure: LEFT HEART  CATH AND CORONARY ANGIOGRAPHY;  Surgeon: Nelva Bush, MD;  Location: [place] [facility];  Service: Cardiovascular;  Laterality: Left;   NASAL SEPTUM SURGERY  MARCH 2013   IN [place]   [facility] IMPEDANCE STUDY N/A 05/30/2017   Procedure: [place] IMPEDANCE STUDY;  Surgeon: Lucilla Lame, MD;  Location: [facility];  Service: Endoscopy;  Laterality: N/A;   POLYPECTOMY N/A 01/26/2017   Procedure: POLYPECTOMY;  Surgeon: Lucilla Lame, MD;  Location: [place];  Service: Endoscopy;  Laterality: N/A;   POSTERIOR CERVICAL FUSION/FORAMINOTOMY N/A 04/16/2013   Procedure: REMOVAL CERVICAL PLATES AND INTERBODY CAGE/POSTERIOR CERVICAL SPINAL FUSION C4 - C6/C5 CORPECTOMY/C4 - C6 FUSION WITH ILIAC CREST BONE GRAFT;  Surgeon: Melina Schools, MD;  Location: [place];  Service: Orthopedics;  Laterality: N/A;   RADIOFREQUENCY ABLATION NERVES     REDUCTION MAMMAPLASTY Bilateral 05/2016   RIGHT HIP ARTHROSCOPY FOR LABRAL TEAR  ABOUT 2010   2012 also   SHOULDER ARTHROSCOPY  05-06-12   bone spur   TEE WITHOUT CARDIOVERSION N/A 08/05/2020   Procedure: TRANSESOPHAGEAL ECHOCARDIOGRAM (TEE);  Surgeon: Lelon Perla, MD;  [facility];  Service: Cardiovascular;  Laterality: N/A;   TONSILLECTOMY     AS A CHILD   TOTAL HIP ARTHROPLASTY Right 05/13/2012   Procedure: TOTAL HIP ARTHROPLASTY ANTERIOR APPROACH;  Surgeon: Gearlean Alf, MD;  [facility];  Service: Orthopedics;  Laterality: Right;    SOCIAL HISTORY: Social History   Socioeconomic History   Marital status: Married    Spouse name: bruce   Number of children: 2   Years of education: Not on file   Highest education level: Associate degree: occupational, Hotel manager, or vocational program  Occupational History    Comment: disabled  Tobacco Use   Smoking status: Never   Smokeless tobacco: Never  Vaping Use   Vaping Use: Never used  Substance and Sexual Activity   Alcohol use: No    Alcohol/week: 0.0 standard drinks    Drug use: Yes    Comment: prescribed morphine and xanax   Sexual activity: Yes  Other Topics Concern   Not on file  Social History Narrative   Son, daughter  And 4 grandkids; raises 3 of the grandchildren      Lives in [place]; with husband; never smoked; no alcohol; 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    Social Determinants of Health   Financial Resource Strain: Low Risk    Difficulty of Paying Living Expenses: Not hard at all  Food Insecurity: No Food Insecurity   Worried About Charity fundraiser in the Last Year: Never true   [place] in the Last Year: Never true  Transportation Needs: No Transportation Needs   Lack of Transportation (Medical): No   Lack of Transportation (Non-Medical): No  Physical Activity: Insufficiently Active   Days of Exercise per Week: 2 days  Minutes of Exercise per Session: 20 min  Stress: No Stress Concern Present   Feeling of Stress : Only a little  Social Connections: Moderately Integrated   Frequency of Communication with Friends and Family: More than three times a week   Frequency of Social Gatherings with Friends and Family: Never   Attends Religious Services: 1 to 4 times per year   Active Member of [employer] or Organizations: No   Attends Music therapist: Never   Marital Status: Married  Human resources officer Violence: Not At Risk   Fear of Current or Ex-Partner: No   Emotionally Abused: No   Physically Abused: No   Sexually Abused: No    FAMILY HISTORY: Family History  Problem Relation Age of Onset   Aneurysm Mother    Bipolar disorder Sister    Aneurysm Maternal Grandmother    Breast cancer Paternal Grandmother 44   Bipolar disorder Grandchild    Anxiety disorder Grandchild    Depression Grandchild    Cancer Neg Hx     ALLERGIES:  is allergic to amoxicillin, chlorhexidine gluconate, clindamycin, codeine, erythromycin, penicillin g, sulfa antibiotics, levofloxacin, shellfish allergy, decadron  [dexamethasone], mangifera indica, papaya derivatives, betadine [povidone iodine], clarithromycin, other, povidone-iodine, and prednisone.  MEDICATIONS:  Current Outpatient Medications  Medication Sig Dispense Refill   ALPRAZolam (XANAX) 1 MG tablet Take 1 mg by mouth at bedtime.      apixaban (ELIQUIS) 5 MG TABS tablet Take 1 tablet (5 mg total) by mouth 2 (two) times daily. 60 tablet 5   busPIRone (BUSPAR) 7.5 MG tablet Take 7.5 mg by mouth 2 (two) times daily.     CREON 36000-114000 units CPEP capsule Take 2 capsules with the first bite of each meal and 1 capsule with the first bite of each snack 720 capsule 2   diltiazem (CARDIZEM) 30 MG tablet TAKE 1 TABLET BY MOUTH EVERY 6 HOURS AS NEEDED 90 tablet 0   estradiol (ESTRACE) 0.1 MG/GM vaginal cream Estrogen Cream Instruction Discard applicator Apply pea sized amount to tip of finger to urethra before bed. Wash hands well after application. Use Monday, Wednesday and Friday 42.5 g 1   linaclotide (LINZESS) 145 MCG CAPS capsule Take 1 capsule (145 mcg total) by mouth daily before breakfast. 30 capsule 0   Menthol, Topical Analgesic, (BENGAY EX) Apply 1 application topically daily as needed (pain).     montelukast (SINGULAIR) 10 MG tablet TAKE 1 TABLET BY MOUTH EVERY DAY 90 tablet 1   morphine (MSIR) 15 MG tablet Take 1 tablet (15 mg total) by mouth every 6 (six) hours as needed for moderate pain or severe pain. Must last 30 days. 120 tablet 0   [START ON 05/23/2021] morphine (MSIR) 15 MG tablet Take 1 tablet (15 mg total) by mouth every 6 (six) hours as needed for moderate pain or severe pain. Must last 30 days. 120 tablet 0   naphazoline-pheniramine (VISINE) 0.025-0.3 % ophthalmic solution 1 drop 4 (four) times daily as needed for eye irritation.     pantoprazole (PROTONIX) 40 MG tablet TAKE 1 TABLET BY MOUTH TWICE A DAY 180 tablet 3   polyethylene glycol (MIRALAX / GLYCOLAX) 17 g packet Take 17 g by mouth daily.     sodium chloride  (OCEAN) 0.65 % SOLN nasal spray Place 1 spray into both nostrils daily as needed for congestion ([place]).     diltiazem (CARDIZEM CD) 120 MG 24 hr capsule Take 1 capsule (120 mg total) by mouth daily. 30 capsule 1  morphine (MSIR) 15 MG tablet Take 1 tablet (15 mg total) by mouth every 6 (six) hours as needed for moderate pain or severe pain. Must last 30 days. 120 tablet 0   No current facility-administered medications for this visit.      PHYSICAL EXAMINATION:   Vitals:   05/04/21 1400  BP: 118/83  Pulse: 73  Resp: 18  Temp: 99 F (37.2 C)   Filed Weights   05/04/21 1400  Weight: 150 lb 12.8 oz (68.4 kg)  Irregular rhythm.  Physical Exam Vitals and nursing note reviewed.  HENT:     Head: Normocephalic and atraumatic.     Mouth/Throat:     Pharynx: Oropharynx is clear.  Eyes:     Extraocular Movements: Extraocular movements intact.     Pupils: Pupils are equal, round, and reactive to light.  Cardiovascular:     Rate and Rhythm: Normal rate.  Pulmonary:     Comments: Decreased breath sounds bilaterally.  Abdominal:     Palpations: Abdomen is soft.  Musculoskeletal:        General: Normal range of motion.     Cervical back: Normal range of motion.  Skin:    General: Skin is warm.  Neurological:     General: No focal deficit present.     Mental Status: She is alert and oriented to person, place, and time.  Psychiatric:        Behavior: Behavior normal.        Judgment: Judgment normal.    LABORATORY DATA:  I have reviewed the data as listed Lab Results  Component Value Date   WBC 5.5 05/04/2021   HGB 13.2 05/04/2021   HCT 38.4 05/04/2021   MCV 84.0 05/04/2021   PLT 324 05/04/2021   Recent Labs    01/13/21 2014 01/14/21 0615 01/19/21 1055 05/04/21 1427  NA 138 136 140 135  K 2.9* 4.3 4.2 3.8  CL 102 104 105 101  CO2 26 24 22 25   GLUCOSE 144* 146* 93 116*  BUN 12 12 7* 11  CREATININE 1.07* 0.85 0.75 0.72  CALCIUM 9.1 8.9 8.9 9.3  GFRNONAA  59* >60  --  >60  PROT 7.9  --  6.2  --   ALBUMIN 4.3  --  3.9  --   AST 21  --  18  --   ALT 11  --  16  --   ALKPHOS 108  --  135*  --   BILITOT 0.8  --  0.3  --      No results found.  Iron deficiency # Anemia- iron deficiency-[NOV 2022] hemoglobin:11 Ferritin-7 .  Poor tolerance to p.o. iron. S/p IV venofer x4- Hb 13.2.  Hold off on infusion.Poor tolerance to PO iron.  Iron studies pending  #Etiology: Unclear etiology.  Status post EGD/colonoscopy-2022-monitor for now.  #Diarrhea-?  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  # DISPOSITION: # no venofer  # follow up in 6 months- MD: labs- cbc/bmp;iron studies/ferritin- possible IV venofer-Dr.B  All questions were answered. The patient knows to call the clinic with any problems, questions or concerns.    Cammie Sickle, MD 05/04/2021 3:22 PM

## 2021-05-04 NOTE — Assessment & Plan Note (Addendum)
#   Anemia- iron deficiency-[NOV 2022] hemoglobin:11 Ferritin-7 .  Poor tolerance to p.o. iron. S/p IV venofer x4- Hb 13.2.  Hold off on infusion.Poor tolerance to PO iron.  Iron studies pending  #Etiology: Unclear etiology.  Status post EGD/colonoscopy-2022-monitor for now.  #Diarrhea-?  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  # DISPOSITION: # no venofer  # follow up in 6 months- MD: labs- cbc/bmp;iron studies/ferritin- possible IV venofer-Dr.B

## 2021-05-06 DIAGNOSIS — I4819 Other persistent atrial fibrillation: Secondary | ICD-10-CM | POA: Diagnosis not present

## 2021-05-10 ENCOUNTER — Telehealth: Payer: Self-pay | Admitting: Cardiology

## 2021-05-10 NOTE — Telephone Encounter (Signed)
Please call with monitor results °

## 2021-05-10 NOTE — Telephone Encounter (Signed)
Advised patient preliminary results show a 2% buren of Afib which is what Dr. Quentin Ore wanted to look at with the monitor. Will call her if any changes need to be made once I get the final from MD.

## 2021-05-20 ENCOUNTER — Other Ambulatory Visit: Payer: Self-pay | Admitting: Urology

## 2021-05-26 ENCOUNTER — Ambulatory Visit: Payer: Self-pay

## 2021-05-26 NOTE — Patient Instructions (Addendum)
Thank you for allowing the Chronic Care Management team to participate in your care. It was great speaking with you today! ?Please do not hesitate to call if you require additional assistance. ? ? ?Aaisha Sliter,RN ?[facility] ?[place] ?[phone] ? ?

## 2021-05-26 NOTE — Chronic Care Management (AMB) (Signed)
?Chronic Care Management  ? ?CCM RN Visit Note ? ?05/26/2021 ?Name: [name] MRN: [id number] DOB: [birth year] ? ?Subjective: ?[name] is a 61 y.o. year old female who is a primary care patient of Bacigalupo, Dionne Bucy, MD. The care management team was consulted for assistance with disease management and care coordination needs.   ? ?Engaged with patient by telephone for follow up visit in response to provider referral for case management and care coordination services.  ? ?Consent to Services:  ?The patient was given information about Chronic Care Management services, agreed to services, and gave verbal consent prior to initiation of services.  Please see initial visit note for detailed documentation.  ? ?Assessment: Review of patient past medical history, allergies, medications, health status, including review of consultants reports, laboratory and other test data, was performed as part of comprehensive evaluation and provision of chronic care management services.  ? ?SDOH (Social Determinants of Health) assessments and interventions performed: No ? ?CCM Care Plan ? ?Allergies  ?Allergen Reactions  ? Amoxicillin Hives  ?  She did ok w ANCEF  ? Chlorhexidine Gluconate Dermatitis and Hives  ? Clindamycin Hives  ? Codeine Hives  ? Erythromycin Hives  ?  "mycins" in general  ? Penicillin G Hives  ?  "cillins" in general  ? Sulfa Antibiotics Nausea And Vomiting and Hives  ?     ? Levofloxacin Hives  ? Shellfish Allergy Hives  ? Decadron [Dexamethasone] Other (See Comments)  ?  Hot flashes, insomnia, "manic" ?Hot flashes, insomnia, "manic"  ? Mangifera Indica Hives  ?  papaya  ? Papaya Derivatives Hives  ? Betadine [Povidone Iodine] Hives  ?     ? Clarithromycin Hives  ? Other Hives  ?   Mango ?  ? Povidone-Iodine Hives  ? Prednisone Anxiety  ?  High blood pressure, flushed, mood changes, heart palpitations ?High blood pressure, flushed, mood changes, heart palpitations  ? ? ?Outpatient Encounter Medications  as of 05/26/2021  ?Medication Sig  ? ALPRAZolam (XANAX) 1 MG tablet Take 1 mg by mouth at bedtime.   ? apixaban (ELIQUIS) 5 MG TABS tablet Take 1 tablet (5 mg total) by mouth 2 (two) times daily.  ? busPIRone (BUSPAR) 7.5 MG tablet Take 7.5 mg by mouth 2 (two) times daily.  ? CREON 36000-114000 units CPEP capsule Take 2 capsules with the first bite of each meal and 1 capsule with the first bite of each snack  ? diltiazem (CARDIZEM CD) 120 MG 24 hr capsule Take 1 capsule (120 mg total) by mouth daily.  ? diltiazem (CARDIZEM) 30 MG tablet TAKE 1 TABLET BY MOUTH EVERY 6 HOURS AS NEEDED  ? estradiol (ESTRACE) 0.1 MG/GM vaginal cream ESTROGEN CREAM INSTRUCTION DISCARD APPLICATOR APPLY PEA SIZED AMOUNT TO TIP OF FINGER TO URETHRA BEFORE BED. Kiefer HANDS WELL AFTER APPLICATION. USE MONDAY, WEDNESDAY AND FRIDAY  ? linaclotide (LINZESS) 145 MCG CAPS capsule Take 1 capsule (145 mcg total) by mouth daily before breakfast.  ? Menthol, Topical Analgesic, (BENGAY EX) Apply 1 application topically daily as needed (pain).  ? montelukast (SINGULAIR) 10 MG tablet TAKE 1 TABLET BY MOUTH EVERY DAY  ? morphine (MSIR) 15 MG tablet Take 1 tablet (15 mg total) by mouth every 6 (six) hours as needed for moderate pain or severe pain. Must last 30 days.  ? morphine (MSIR) 15 MG tablet Take 1 tablet (15 mg total) by mouth every 6 (six) hours as needed for moderate pain or severe pain. Must  last 30 days.  ? morphine (MSIR) 15 MG tablet Take 1 tablet (15 mg total) by mouth every 6 (six) hours as needed for moderate pain or severe pain. Must last 30 days.  ? naphazoline-pheniramine (VISINE) 0.025-0.3 % ophthalmic solution 1 drop 4 (four) times daily as needed for eye irritation.  ? pantoprazole (PROTONIX) 40 MG tablet TAKE 1 TABLET BY MOUTH TWICE A DAY  ? polyethylene glycol (MIRALAX / GLYCOLAX) 17 g packet Take 17 g by mouth daily.  ? sodium chloride (OCEAN) 0.65 % SOLN nasal spray Place 1 spray into both nostrils daily as needed for congestion  ([place]).  ? ?No facility-administered encounter medications on file as of 05/26/2021.  ? ? ?Patient Active Problem List  ? Diagnosis Date Noted  ? Iron deficiency 02/01/2021  ? Skin abnormalities 01/18/2021  ? Epigastric abdominal pain   ? Intractable nausea and vomiting   ? Ileus ([place]) 01/14/2021  ? Atrial fibrillation with RVR ([place])   ? Enteritis   ? Chest pain   ? Chronic shoulder pain (Right) 11/30/2020  ? 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  ? Arthropathy of shoulder (Right) 11/30/2020  ? C7 radiculopathy (Right) 11/30/2020  ? Cervical radiculopathy at C8 (Right) 11/30/2020  ? Muscle weakness of upper extremity (Right) 11/30/2020  ? Weakness of upper extremity (Right) 11/30/2020  ? Cervical fusion syndrome 11/30/2020  ? 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 Chronic use of opiate for therapeutic purpose 08/18/2020  ? Persistent atrial fibrillation ([place]) 06/30/2020  ? Abnormal MRI, cervical spine (05/12/2020) 06/24/2020  ? Chronic upper back pain 06/24/2020  ? Enthesopathy of shoulder (Left) 05/13/2020  ? Osteoarthritis of shoulder (Left) 05/13/2020  ? Symptoms referable to shoulder joint 05/13/2020  ? Tendinopathy of rotator cuff (Left) 05/13/2020  ? Sprain of supraspinatus muscle or tendon, sequela (Left) 05/13/2020  ? Subdeltoid bursitis of shoulder (Left) 05/13/2020  ? Subacromial bursitis of shoulder (Left) 05/13/2020  ? Chronic shoulder pain (Left) 05/13/2020  ? History of allergy to iodine 05/13/2020  ?  Class: History of  ? Abnormal MRI, shoulder (03/20/2017) 04/21/2020  ? Chronic anticoagulation (Eliquis) 04/21/2020  ? Chronic neck pain with history of cervical spinal surgery 04/21/2020  ? Multiple allergies 04/21/2020  ? S/P repair of paraesophageal hernia 02/26/2020  ? Heartburn   ? Gastritis without bleeding   ? Typical atrial flutter ([place])   ? Foraminal stenosis of cervical region (C3-4) (Right) 08/26/2019  ? 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)  08/26/2019  ? DDD (degenerative disc disease), cervical 08/25/2019  ? Cervical radiculitis (C4,C5,C8) (Left) 08/20/2019  ? Pure hypercholesterolemia 06/27/2019  ? Constipation due to opioid therapy 06/27/2019  ? Snoring 06/27/2019  ? Unstable angina ([place]) 06/10/2019  ? Chronic musculoskeletal pain 01/14/2019  ? Hypertension 12/27/2018  ? Spondylosis without myelopathy or radiculopathy, lumbosacral region 11/12/2018  ? History of allergy to radiographic contrast media 11/12/2018  ? History of allergy to shellfish 11/12/2018  ? DDD (degenerative disc disease), lumbosacral 07/31/2018  ? Neuropathic pain 07/31/2018  ? Neurogenic pain 07/31/2018  ? Vitamin D deficiency 05/28/2018  ? History of lumbar surgery 05/28/2018  ? Groin pain, chronic, left 05/20/2018  ? Other intervertebral disc degeneration, lumbar region 05/20/2018  ? Disorder of skeletal system 05/20/2018  ? Problems influencing health status 05/20/2018  ? Overweight 12/25/2017  ? Enthesopathy of hip region (Left) 07/13/2017  ? Trigger point of shoulder region (Left) 02/26/2017  ? Pharmacologic therapy   ? Polyp of sigmoid colon   ?  Problems with swallowing and mastication   ? Chronic shoulder arthropathy (Left) 01/04/2017  ? Dysphagia 12/07/2016  ? Abnormal flushing and sweating 12/07/2016  ? Long term prescription benzodiazepine use 10/11/2016  ? 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 Chronic hip pain (2ry area of Pain) (Bilateral) (L>R) 10/11/2016  ? 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 Cervicogenic headache 07/06/2015  ? History of total hip replacement (THR) (Right) 07/05/2015  ? Chronic shoulder radicular pain (Bilateral) (L>R) 07/05/2015  ? Lumbar facet syndrome (Bilateral) (L>R) 07/05/2015  ? Chronic low back pain (1ry area of Pain) (Bilateral) (L>R) w/o sciatica 04/05/2015  ? Chronic hip pain (Right) 01/04/2015  ? Encounter for therapeutic drug level monitoring 01/04/2015  ? Long term current use of  opiate analgesic 01/04/2015  ? Long term prescription opiate use 01/04/2015  ? Opiate use 01/04/2015  ? Chronic pain syndrome 01/04/2015  ? Steroid intolerance 01/04/2015  ? Cervical spondylosis 01/04/2015

## 2021-05-31 ENCOUNTER — Inpatient Hospital Stay: Admission: RE | Admit: 2021-05-31 | Payer: Medicare Other | Source: Ambulatory Visit

## 2021-06-03 ENCOUNTER — Other Ambulatory Visit: Payer: Self-pay | Admitting: Gastroenterology

## 2021-06-09 NOTE — Progress Notes (Signed)
PROVIDER NOTE: Information contained herein reflects review and annotations entered in association with encounter. Interpretation of such information and data should be left to medically-trained personnel. Information provided to patient can be located elsewhere in the medical record under "Patient Instructions". Document created using STT-dictation technology, any transcriptional errors that may result from process are unintentional.  ?  ?Patient: [name]  Service Category: E/M  Provider: Gaspar Cola, MD  ?DOB: 1961-02-20  DOS: 06/13/2021  Specialty: Interventional Pain Management  ?MRN: [id number]  Setting: Ambulatory outpatient  PCP: Virginia Crews, MD  ?Type: Established Patient    Referring Provider: Virginia Crews, MD  ?Location: Office  Delivery: Face-to-face    ? ?HPI  ?Ms. GRACY EHLY, a 61 y.o. year old female, is here today because of her Chronic pain syndrome [G89.4]. Ms. Sloma primary complain today is Back Pain (lower) ?Last encounter: My last encounter with her was on 03/24/2021. ?Pertinent problems: Ms. 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); Chronic hip pain (2ry area of Pain) (Bilateral) (L>R); Chronic shoulder arthropathy (Left); Trigger point of shoulder region (Left);  Enthesopathy of hip region (Left); Groin pain, chronic, left; Other intervertebral disc degeneration, lumbar region; History of lumbar surgery; DDD (degenerative disc disease), lumbosacral; Neuropathic pain; Neurogenic pain; Spondylosis without myelopathy or radiculopathy, lumbosacral region; Chronic musculoskeletal pain; Cervical radiculitis (C4,C5,C8) (Left); DDD (degenerative disc disease), cervical; Foraminal stenosis of cervical region (C3-4) (Right); 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); Abnormal MRI, shoulder (03/20/2017); Chronic neck pain with history of cervical spinal surgery; Enthesopathy of shoulder (Left); Osteoarthritis of shoulder (Left); Symptoms referable to shoulder joint; Tendinopathy of rotator cuff (Left); Sprain of supraspinatus muscle or tendon, sequela (Left); Subdeltoid bursitis of shoulder (Left); Subacromial bursitis of shoulder (Left); Chronic shoulder pain (Left); Abnormal MRI, cervical spine (05/12/2020); Chronic upper back pain; Chronic shoulder pain (Right); Osteoarthritis of shoulder (Right); Arthropathy of shoulder (Right); C7 radiculopathy (Right); Cervical radiculopathy at C8 (Right); Muscle weakness of upper extremity (Right); Weakness of upper extremity (Right); Cervical fusion syndrome; and Failed back syndrome of cervical spine on their pertinent problem list. ?Pain Assessment: Severity of Chronic pain is reported as a 4 /10. Location: Back Right, Left, Lower/pain radiaities down to both hip. Onset: More than a month ago. Quality: Aching, Sharp, Constant. Timing: Constant. Modifying factor(s): laying, heat, ben gay, massage, meds. ?Vitals:  height is '5\' 4"'$  (1.626 m) and weight is 150 lb (68 kg). Her temperature is 97.3 ?F (36.3 ?C) (abnormal). Her blood pressure is 110/70 and her pulse is 79. Her oxygen saturation is 100%.  ? ?Reason for encounter: medication management.   The patient indicates doing well with the current medication regimen. No adverse  reactions or side effects reported to the medications.  ? ?RTCB: 09/20/2021 ? ?Pharmacotherapy Assessment  ?Analgesic: Morphine IR 15 mg 1 tab p.o. every 6 hours (60 mg/day of morphine) ?MME/day: 60 mg/day.  ? ?Monitoring: ?[place] PMP: PDMP reviewed during this encounter.       ?  Pharmacotherapy: No side-effects or adverse reactions reported. ?Compliance: No problems identified. ?Effectiveness: Clinically acceptable. ? ?Chauncey Fischer, RN  06/13/2021  9:58 AM  Sign when Signing Visit ?Nursing Pain Medication Assessment:  ?Safety precautions to be maintained throughout the outpatient stay will include: orient to surroundings, keep bed in low position, maintain call bell within reach at all times, provide assistance with transfer out of bed and ambulation.  ?Medication Inspection Compliance: Pill count conducted under aseptic conditions, in front of the patient. Neither the pills nor the bottle was removed from the patient's sight at any time. Once count was completed pills were immediately returned to the patient in their original bottle. ? ?Medication: Morphine IR ?Pill/Patch Count:  46 of 120 pills remain ?Pill/Patch Appearance: Markings consistent with prescribed medication ?Bottle Appearance: Standard pharmacy container. Clearly labeled. ?Filled Date: 3 / 102 / 2023 ?Last Medication intake:  TodaySafety precautions to be maintained throughout the outpatient stay will include: orient to surroundings, keep bed in low position, maintain call bell within reach at all times, provide assistance with transfer out of bed and ambulation.  ?   UDS:  ?Summary  ?Date Value Ref Range Status  ?08/18/2020 Note  Final  ?  Comment:  ?  ==================================================================== ?[employer] Select 13 (MW) ?==================================================================== ?Test                             Result       Flag       Units ? ?Drug Present and Declared for Prescription Verification ?  Alprazolam                      170          EXPECTED   ng/mg creat ?  Alpha-hydroxyalprazolam        484          EXPECTED   ng/mg creat ?   Source of alprazolam is a scheduled prescription medication. Alpha- ?   hydroxyalprazolam is an expected metabolite of alprazolam. ? ?  Morphine                       >9524        EXPECTED   ng/mg creat ?  Normorphine                    757          EXPECTED   ng/mg creat ?   Potential sources of large amounts of morphine in the absence of ?   codeine include administration of morphine or use of heroin. ? ?   Normorphine is an expected metabolite of morphine. ? ?  Hydromorphone                  650          EXPECTED   ng/mg creat ?   Hydromorphone may be present as a metabolite of morphine; ?   concentrations of hydromorphone rarely exceed 5% of the morphine ?   concentration when this is the source of hydromorphone. ? ?==================================================================== ?Test                      Result    Flag   Units      Ref Range ?  Creatinine              105  mg/dL      >=20 ?==================================================================== ?Declared Medications: ? The flagging and interpretation on this report are based on the ? following declared medications.  Unexpected results may arise from ? inaccuracies in the declared medications. ? ? **Note: The testing scope of this panel includes these medications: ? ? Alprazolam (Xanax) ? Morphine (MSIR) ? ? **Note: The testing scope of this panel does not include the ? following reported medications: ? ? Acetaminophen (Tylenol) ? Amiodarone ? Apixaban (Eliquis) ? Buspirone (Buspar) ? Dicyclomine (Bentyl) ? Eye Drops ? Linaclotide (Linzess) ? Menthol ? Montelukast (Singulair) ? Ondansetron (Zofran) ? Pantoprazole (Protonix) ? Polyethylene Glycol (MiraLAX) ? Sodium Chloride ? Valacyclovir (Valtrex) ?==================================================================== ?For clinical consultation, please call [phone]. ?==================================================================== ?  ?  ? ?ROS  ?Constitutional: Denies any fever or chills ?Gastrointestinal: No reported hemesis, hematochezia, vomiting, o

## 2021-06-13 ENCOUNTER — Ambulatory Visit: Payer: Medicare Other | Attending: Pain Medicine | Admitting: Pain Medicine

## 2021-06-13 ENCOUNTER — Other Ambulatory Visit: Payer: Self-pay | Admitting: Family Medicine

## 2021-06-13 ENCOUNTER — Encounter: Payer: Self-pay | Admitting: Pain Medicine

## 2021-06-13 VITALS — BP 110/70 | HR 79 | Temp 97.3°F | Ht 64.0 in | Wt 150.0 lb

## 2021-06-13 DIAGNOSIS — G8928 Other chronic postprocedural pain: Secondary | ICD-10-CM | POA: Insufficient documentation

## 2021-06-13 DIAGNOSIS — B001 Herpesviral vesicular dermatitis: Secondary | ICD-10-CM

## 2021-06-13 DIAGNOSIS — G894 Chronic pain syndrome: Secondary | ICD-10-CM | POA: Insufficient documentation

## 2021-06-13 DIAGNOSIS — M7918 Myalgia, other site: Secondary | ICD-10-CM | POA: Insufficient documentation

## 2021-06-13 DIAGNOSIS — M25512 Pain in left shoulder: Secondary | ICD-10-CM | POA: Diagnosis not present

## 2021-06-13 DIAGNOSIS — M961 Postlaminectomy syndrome, not elsewhere classified: Secondary | ICD-10-CM | POA: Insufficient documentation

## 2021-06-13 DIAGNOSIS — Z79899 Other long term (current) drug therapy: Secondary | ICD-10-CM | POA: Insufficient documentation

## 2021-06-13 DIAGNOSIS — Z9889 Other specified postprocedural states: Secondary | ICD-10-CM | POA: Insufficient documentation

## 2021-06-13 DIAGNOSIS — M545 Low back pain, unspecified: Secondary | ICD-10-CM | POA: Insufficient documentation

## 2021-06-13 DIAGNOSIS — M25552 Pain in left hip: Secondary | ICD-10-CM | POA: Diagnosis not present

## 2021-06-13 DIAGNOSIS — M542 Cervicalgia: Secondary | ICD-10-CM | POA: Diagnosis not present

## 2021-06-13 DIAGNOSIS — Z79891 Long term (current) use of opiate analgesic: Secondary | ICD-10-CM | POA: Diagnosis not present

## 2021-06-13 DIAGNOSIS — G8929 Other chronic pain: Secondary | ICD-10-CM | POA: Diagnosis not present

## 2021-06-13 DIAGNOSIS — M25551 Pain in right hip: Secondary | ICD-10-CM | POA: Insufficient documentation

## 2021-06-13 DIAGNOSIS — M25511 Pain in right shoulder: Secondary | ICD-10-CM | POA: Insufficient documentation

## 2021-06-13 MED ORDER — MORPHINE SULFATE 15 MG PO TABS: 15.0000 mg | ORAL_TABLET | Freq: Four times a day (QID) | ORAL | 0 refills | Status: DC | PRN

## 2021-06-13 MED ORDER — VALACYCLOVIR HCL 1 G PO TABS: ORAL_TABLET | ORAL | 5 refills | Status: DC

## 2021-06-13 NOTE — Addendum Note (Signed)
Addended by: Doristine Devoid on: 06/13/2021 03:03 PM ? ? Modules accepted: Orders ? ?

## 2021-06-13 NOTE — Telephone Encounter (Signed)
Please resend Rx that failed to transmit electronically. Thanks!

## 2021-06-13 NOTE — Patient Instructions (Signed)
____________________________________________________________________________________________ ? ?Medication Rules ? ?Purpose: To inform patients, and their family members, of our rules and regulations. ? ?Applies to: All patients receiving prescriptions (written or electronic). ? ?Pharmacy of record: Pharmacy where electronic prescriptions will be sent. If written prescriptions are taken to a different pharmacy, please inform the nursing staff. The pharmacy listed in the electronic medical record should be the one where you would like electronic prescriptions to be sent. ? ?Electronic prescriptions: In compliance with the [place] Strengthen Opioid Misuse Prevention (STOP) Act of 2017 (Session Law 2017-74/H243), effective March 13, 2018, all controlled substances must be electronically prescribed. Calling prescriptions to the pharmacy will cease to exist. ? ?Prescription refills: Only during scheduled appointments. Applies to all prescriptions. ? ?NOTE: The following applies primarily to controlled substances (Opioid* Pain Medications).  ? ?Type of encounter (visit): For patients receiving controlled substances, face-to-face visits are required. (Not an option or up to the patient.) ? ?Patient's responsibilities: ?Pain Pills: Bring all pain pills to every appointment (except for procedure appointments). ?Pill Bottles: Bring pills in original pharmacy bottle. Always bring the newest bottle. Bring bottle, even if empty. ?Medication refills: You are responsible for knowing and keeping track of what medications you take and those you need refilled. ?The day before your appointment: write a list of all prescriptions that need to be refilled. ?The day of the appointment: give the list to the admitting nurse. Prescriptions will be written only during appointments. No prescriptions will be written on procedure days. ?If you forget a medication: it will not be "Called in", "Faxed", or "electronically sent". You will  need to get another appointment to get these prescribed. ?No early refills. Do not call asking to have your prescription filled early. ?Prescription Accuracy: You are responsible for carefully inspecting your prescriptions before leaving our office. Have the discharge nurse carefully go over each prescription with you, before taking them home. Make sure that your name is accurately spelled, that your address is correct. Check the name and dose of your medication to make sure it is accurate. Check the number of pills, and the written instructions to make sure they are clear and accurate. Make sure that you are given enough medication to last until your next medication refill appointment. ?Taking Medication: Take medication as prescribed. When it comes to controlled substances, taking less pills or less frequently than prescribed is permitted and encouraged. ?Never take more pills than instructed. ?Never take medication more frequently than prescribed.  ?Inform other Doctors: Always inform, all of your healthcare providers, of all the medications you take. ?Pain Medication from other Providers: You are not allowed to accept any additional pain medication from any other Doctor or Healthcare provider. There are two exceptions to this rule. (see below) In the event that you require additional pain medication, you are responsible for notifying us, as stated below. ?Cough Medicine: Often these contain an opioid, such as codeine or hydrocodone. Never accept or take cough medicine containing these opioids if you are already taking an opioid* medication. The combination may cause respiratory failure and death. ?Medication Agreement: You are responsible for carefully reading and following our Medication Agreement. This must be signed before receiving any prescriptions from our practice. Safely store a copy of your signed Agreement. Violations to the Agreement will result in no further prescriptions. (Additional copies of our  Medication Agreement are available upon request.) ?Laws, Rules, & Regulations: All patients are expected to follow all Federal and State Laws, Statutes, Rules, & Regulations. Ignorance of   the Laws does not constitute a valid excuse.  ?Illegal drugs and Controlled Substances: The use of illegal substances (including, but not limited to marijuana and its derivatives) and/or the illegal use of any controlled substances is strictly prohibited. Violation of this rule may result in the immediate and permanent discontinuation of any and all prescriptions being written by our practice. The use of any illegal substances is prohibited. ?Adopted CDC guidelines & recommendations: Target dosing levels will be at or below 60 MME/day. Use of benzodiazepines** is not recommended. ? ?Exceptions: There are only two exceptions to the rule of not receiving pain medications from other Healthcare Providers. ?Exception #1 (Emergencies): In the event of an emergency (i.e.: accident requiring emergency care), you are allowed to receive additional pain medication. However, you are responsible for: As soon as you are able, call our office [phone], at any time of the day or night, and leave a message stating your name, the date and nature of the emergency, and the name and dose of the medication prescribed. In the event that your call is answered by a member of our staff, make sure to document and save the date, time, and the name of the person that took your information.  ?Exception #2 (Planned Surgery): In the event that you are scheduled by another doctor or dentist to have any type of surgery or procedure, you are allowed (for a period no longer than 30 days), to receive additional pain medication, for the acute post-op pain. However, in this case, you are responsible for picking up a copy of our "Post-op Pain Management for Surgeons" handout, and giving it to your surgeon or dentist. This document is available at our office, and  does not require an appointment to obtain it. Simply go to our office during business hours (Monday-Thursday from 8:00 AM to 4:00 PM) (Friday 8:00 AM to 12:00 Noon) or if you have a scheduled appointment with us, prior to your surgery, and ask for it by name. In addition, you are responsible for: calling our office [phone], at any time of the day or night, and leaving a message stating your name, name of your surgeon, type of surgery, and date of procedure or surgery. Failure to comply with your responsibilities may result in termination of therapy involving the controlled substances. ?Medication Agreement Violation. Following the above rules, including your responsibilities will help you in avoiding a Medication Agreement Violation (?Breaking your Pain Medication Contract?). ? ?*Opioid medications include: morphine, codeine, oxycodone, oxymorphone, hydrocodone, hydromorphone, meperidine, tramadol, tapentadol, buprenorphine, fentanyl, methadone. ?**Benzodiazepine medications include: diazepam (Valium), alprazolam (Xanax), clonazepam (Klonopine), lorazepam (Ativan), clorazepate (Tranxene), chlordiazepoxide (Librium), estazolam (Prosom), oxazepam (Serax), temazepam (Restoril), triazolam (Halcion) ?(Last updated: 12/08/2020) ?____________________________________________________________________________________________ ? ____________________________________________________________________________________________ ? ?Medication Recommendations and Reminders ? ?Applies to: All patients receiving prescriptions (written and/or electronic). ? ?Medication Rules & Regulations: These rules and regulations exist for your safety and that of others. They are not flexible and neither are we. Dismissing or ignoring them will be considered "non-compliance" with medication therapy, resulting in complete and irreversible termination of such therapy. (See document titled "Medication Rules" for more details.) In all conscience,  because of safety reasons, we cannot continue providing a therapy where the patient does not follow instructions. ? ?Pharmacy of record:  ?Definition: This is the pharmacy where your electronic prescriptions w

## 2021-06-13 NOTE — Progress Notes (Signed)
Nursing Pain Medication Assessment:  ?Safety precautions to be maintained throughout the outpatient stay will include: orient to surroundings, keep bed in low position, maintain call bell within reach at all times, provide assistance with transfer out of bed and ambulation.  ?Medication Inspection Compliance: Pill count conducted under aseptic conditions, in front of the patient. Neither the pills nor the bottle was removed from the patient's sight at any time. Once count was completed pills were immediately returned to the patient in their original bottle. ? ?Medication: Morphine IR ?Pill/Patch Count:  46 of 120 pills remain ?Pill/Patch Appearance: Markings consistent with prescribed medication ?Bottle Appearance: Standard pharmacy container. Clearly labeled. ?Filled Date: 3 / 57 / 2023 ?Last Medication intake:  TodaySafety precautions to be maintained throughout the outpatient stay will include: orient to surroundings, keep bed in low position, maintain call bell within reach at all times, provide assistance with transfer out of bed and ambulation.  ?

## 2021-06-13 NOTE — Telephone Encounter (Signed)
resent

## 2021-06-18 DIAGNOSIS — S46911A Strain of unspecified muscle, fascia and tendon at shoulder and upper arm level, right arm, initial encounter: Secondary | ICD-10-CM | POA: Diagnosis not present

## 2021-06-24 ENCOUNTER — Other Ambulatory Visit: Payer: Self-pay

## 2021-06-27 ENCOUNTER — Other Ambulatory Visit: Payer: Self-pay

## 2021-06-27 MED ORDER — DILTIAZEM HCL ER COATED BEADS 120 MG PO CP24: 120.0000 mg | ORAL_CAPSULE | Freq: Every day | ORAL | 1 refills | Status: DC

## 2021-06-28 ENCOUNTER — Other Ambulatory Visit: Payer: Self-pay

## 2021-06-28 MED ORDER — DILTIAZEM HCL 30 MG PO TABS: 30.0000 mg | ORAL_TABLET | Freq: Four times a day (QID) | ORAL | 0 refills | Status: DC | PRN

## 2021-07-06 ENCOUNTER — Encounter: Payer: Self-pay | Admitting: Gastroenterology

## 2021-07-13 ENCOUNTER — Other Ambulatory Visit: Payer: Self-pay

## 2021-07-14 ENCOUNTER — Encounter: Payer: Self-pay | Admitting: Gastroenterology

## 2021-07-14 ENCOUNTER — Ambulatory Visit: Payer: Medicare Other | Admitting: Gastroenterology

## 2021-07-14 VITALS — BP 146/96 | HR 80 | Temp 98.8°F | Ht 64.0 in | Wt 154.1 lb

## 2021-07-14 DIAGNOSIS — K8681 Exocrine pancreatic insufficiency: Secondary | ICD-10-CM

## 2021-07-14 DIAGNOSIS — K5904 Chronic idiopathic constipation: Secondary | ICD-10-CM

## 2021-07-14 NOTE — Progress Notes (Signed)
?  ?Cephas Darby, MD ?[address]  ?[address]  ?[place], [place] [address]  ?Main: [phone]  ?Fax: [phone] ? ? ? ?Gastroenterology Consultation ? ?Referring Provider:     Virginia Crews, MD ?Primary Care Physician:  Virginia Crews, MD ?Primary Gastroenterologist:  Dr. Cephas Darby ?Reason for Consultation:     EPI, chronic constipation and abdominal bloating ?      ? HPI:   ?[name] is a 61 y.o. female referred by Dr. Brita Romp, Dionne Bucy, MD  for consultation & management of exocrine pancreatic insufficiency. Patient has history of paroxysmal A. fib/flutter s/p ablation on 08/06/2020, sinus bradycardia, hypertension, asthma, GERD s/p robotic assisted laparoscopic paraesophageal hernia repair with Nissen's fundoplication in 15/7262 by Dr. Dahlia Byes, history of OSA.  Patient was admitted to [facility] in early November 2022 secondary to severe abdominal discomfort, nausea.  She underwent CT angio chest abdomen and pelvis with no evidence of acute intra-abdominal pathology.  There is no evidence of significant visceral artery stenosis.  There was presence of nonspecific enteritis and small bowel ileus with no evidence of bowel obstruction.  There was also presence of pancreatic atrophy, she is status postcholecystectomy.  Patient was managed conservatively which resolved her ileus and she started moving her bowels.  Subsequently, patient underwent upper endoscopy and colonoscopy including biopsies which were all unremarkable.  There was no evidence of IBD or any other pathology.  Patient is found to have low pancreatic fecal elastase levels 107, therefore started on Creon.  Since discharge, patient reports that she has been taking Creon up to 3 pills daily because she does not have 3 full meals a day.  She gained about 4 pounds since discharge.  She does report ongoing abdominal discomfort, abdominal bloating and loose stools sometimes mushy and foul-smelling and the symptoms are  predominantly postprandial. ? ?She is also found to have iron deficiency anemia, received parenteral iron therapy as outpatient.  She has follow-up labs in 04/2021 by Dr. Rogue Bussing. ?She was not a smoker in her life.  She does not drink alcohol ? ?Follow-up visit 07/14/2021 ?Patient reports ongoing upper abdominal discomfort associated with abdominal bloating and intermittent constipation.  She started taking MiraLAX 3 capfuls daily to move her bowels which lead to 3-4 loose bowel movements a day.  She tried higher dose of Linzess which did not help. She admits to drinking less water and consumes ice tea daily.  She likes to eat sweet potato and white potatoes on a regular basis.  She does not consume any fruit juices, red meat.  She is taking Creon with each meal.  She is gaining weight. ? ?She wanted to see dietitian at [facility], however, her insurance does not cover the visit and she has to pay $300 consultation fee.  She decided not to pursue ? ?NSAIDs: None ? ?Antiplts/Anticoagulants/Anti thrombotics: Eliquis for history of A. fib ? ?GI Procedures:  ?Upper endoscopy 01/15/21 ?- Suspected jejunal inflammation characterized by friability. Biopsied. ?- Normal examined duodenum. Biopsied. ?- Mucosal changes suspicious for gastritis. Biopsied. ?- A Nissen fundoplication was found. The wrap appears intact. ?- Esophagogastric landmarks identified. ?- Normal gastroesophageal junction and esophagus. ?DIAGNOSIS:  ?A. JEJUNUM; COLD BIOPSY:  ?- ENTERIC MUCOSA WITH DENSE LYMPHOPLASMACYTIC INFILTRATE.  ?- NEGATIVE FOR ACTIVE INFLAMMATION.  ?- NEGATIVE FOR DYSPLASIA NAD MALIGNANCY.  ? ?B. DUODENUM; COLD BIOPSY:  ?- DUODENAL MUCOSA WITH NO SIGNIFICANT PATHOLOGIC ALTERATION.  ?- NEGATIVE FOR FEATURES OF CELIAC DISEASE.  ?- NEGATIVE FOR DYSPLASIA  AND MALIGNANCY.  ? ?C. STOMACH, RANDOM; COLD BIOPSY:  ?- CHRONIC ACTIVE GASTRITIS WITH REACTIVE CHANGES.  ?- NEGATIVE FOR HELICOBACTER PYLORI BY IMMUNOHISTOCHEMISTRY.  ?- NEGATIVE FOR  INTESTINAL METAPLASIA, DYSPLASIA, AND MALIGNANCY.  ? ?Colonoscopy 01/16/2021 ?- Preparation of the colon was fair. ?- The examined portion of the ileum was normal. ?- The entire examined colon is normal. Biopsied. ?- The distal rectum and anal verge are normal on retroflexion view. ?DIAGNOSIS:  ?A. COLON, RANDOM; COLD BIOPSY:  ?- COLONIC MUCOSA WITH NO SIGNIFICANT PATHOLOGIC ALTERATION.  ?- NEGATIVE FOR ACTIVE INFLAMMATION AND FEATURES OF CHRONICITY.  ?- NEGATIVE FOR DYSPLASIA AND MALIGNANCY.  ? ?Past Medical History:  ?Diagnosis Date  ? Abnormal EKG   ? HX OF INVERTED T WAVES ON EKG, PALPITATIONS, CHEST PAINS-CARDIAC WORK UP DID NOT SHOW ANY HEART DISEASE  ? AC (acromioclavicular) joint bone spurs   ? Acute postoperative pain 01/04/2017  ? 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 Anemia   ? Iron Infusion-8 yrs ago  ? Anxiety   ? Asthma   ? Cephalalgia 08/18/2014  ? Cervical disc disease 08/18/2014  ? Needs neck surgery.   ? Chronic headaches   ? Chronic, continuous use of opioids   ? DDD (degenerative disc disease)   ? CERVICAL AND LUMBAR-CHRONIC PAIN, RT HIP LABRAL TEAR  ? Depression   ? PT STATES A LOT OF STRESS IN HER LIFE  ? Dissociative disorder   ? Dizziness 04/22/2013  ? Duodenal ulcer with hemorrhage and perforation ([place]) 04/27/2003  ? Foot drop, right   ? FROM BACK SURGERY  ? GERD (gastroesophageal reflux disease)   ? H/O arthrodesis 08/18/2014  ? Headache(784.0)   ? AND NECK PAIN--STATES RECENT TEST SHOW CERVICAL DEGENERATION  ? History of blood transfusion   ? s/p back surgery  ? History of cardiac cath   ? a. 05/2019 Cath: Nl cors. EF 55-65%.  ? History of cardioversion   ? History of cervical spinal surgery 01/04/2015  ? History of kidney stones   ? Hypertension   ? Inverted T wave   ? Iron deficiency 02/01/2021  ? Mitral regurgitation   ? a. 07/2020 Echo: EF 60-65%, no rwma. Nl RV size/fxn. RVSP 39.79mHg. Mildly to mod dil LA. Mod MR; b. 07/2020 TEE: EF 55-60%. Lambl's excresence. Nl RV size/fxn. Midly dil LA. No  LA/LAA thrombus. Mild MR.  ? Narrowing of intervertebral disc space 08/18/2014  ? Currently on disability.   ? Orthostatic hypotension 04/22/2013  ? Pain   ? CHRONIC NECK AND BACK PAIN - LIMITED ROM NECK - S/P FUSIONS CERVICAL AND LUMBAR  ? Paroxysmal atrial fibrillation ([place] 2021  ? a. 07/2020 s/p PVI.  ? Pneumonia   ? PONV (postoperative nausea and vomiting)   ? PT GIVES HX OF N&V AND FEVER WITH SURGERIES YEARS AGO--BUT NO PROBLEMS WITH MORE RECENT SURGERIES--STATES NOT MALIGNANT HYPERTHERMIA  ? Postop Hyponatremia 05/14/2012  ? Postoperative anemia due to acute blood loss 05/14/2012  ? PTSD (post-traumatic stress disorder)   ? Right foot drop   ? Right hip arthralgia 08/18/2014  ? Status post surgery of right, and now needs left.   ? Sleep apnea 2021  ? does not have a cpap  ? Therapeutic opioid-induced constipation ([facility])   ? Typical atrial flutter ([place]   ? ? ?Past Surgical History:  ?Procedure Laterality Date  ? ABDOMINAL HYSTERECTOMY    ? afib  05/2019  ? ANTERIOR CERVICAL DECOMP/DISCECTOMY FUSION N/A 10/30/2012  ? Procedure: ACDF C5-6, EXPLORATION AND HARDWARE  REMOVAL C6-7;  Surgeon: Melina Schools, MD;  Location: [facility];  Service: Orthopedics;  Laterality: N/A;  ? ANTERIOR FUSION CERVICAL SPINE  MAY 2012  ? AT [facility]  ? ATRIAL FIBRILLATION ABLATION N/A 08/06/2020  ? Procedure: ATRIAL FIBRILLATION ABLATION;  Surgeon: Vickie Epley, MD;  Location: [facility];  Service: Cardiovascular;  Laterality: N/A;  ? BACK SURGERY  2009  ? LUMBAR FUSION WITH RODS   ? BREAST BIOPSY Right 2008  ? benign.- bx/clip  ? BREAST EXCISIONAL BIOPSY Left 1998  ? benign  ? BREAST REDUCTION SURGERY Bilateral 06/2016  ? CARDIAC ELECTROPHYSIOLOGY MAPPING AND ABLATION    ? CARDIOVERSION N/A 12/03/2019  ? Procedure: CARDIOVERSION;  Surgeon: Kate Sable, MD;  Location: [facility];  Service: Cardiovascular;  Laterality: N/A;  ? CARPAL TUNNEL RELEASE  05-06-12  ? Right  ? CHOLECYSTECTOMY    ? COLONOSCOPY WITH PROPOFOL N/A 01/26/2017   ? Procedure: COLONOSCOPY WITH PROPOFOL;  Surgeon: Lucilla Lame, MD;  Location: [facility];  Service: Endoscopy;  Laterality: N/A;  ? COLONOSCOPY WITH PROPOFOL N/A 01/16/2021  ? Procedure: COLONOSCOPY WITH PROPO

## 2021-07-21 ENCOUNTER — Other Ambulatory Visit: Payer: Self-pay

## 2021-07-21 MED ORDER — DILTIAZEM HCL 30 MG PO TABS: 30.0000 mg | ORAL_TABLET | Freq: Four times a day (QID) | ORAL | 0 refills | Status: DC | PRN

## 2021-07-27 ENCOUNTER — Ambulatory Visit: Payer: Self-pay | Admitting: *Deleted

## 2021-07-27 ENCOUNTER — Observation Stay: Payer: Medicare Other

## 2021-07-27 ENCOUNTER — Inpatient Hospital Stay
Admission: EM | Admit: 2021-07-27 | Discharge: 2021-08-01 | DRG: 310 | Disposition: A | Discharge status: Home / Self Care | Payer: Medicare Other | Attending: Internal Medicine | Admitting: Internal Medicine

## 2021-07-27 DIAGNOSIS — Z20822 Contact with and (suspected) exposure to covid-19: Secondary | ICD-10-CM | POA: Diagnosis present

## 2021-07-27 DIAGNOSIS — R197 Diarrhea, unspecified: Secondary | ICD-10-CM | POA: Diagnosis not present

## 2021-07-27 DIAGNOSIS — Z803 Family history of malignant neoplasm of breast: Secondary | ICD-10-CM | POA: Diagnosis not present

## 2021-07-27 DIAGNOSIS — I700 Atherosclerosis of aorta: Secondary | ICD-10-CM | POA: Diagnosis present

## 2021-07-27 DIAGNOSIS — D509 Iron deficiency anemia, unspecified: Secondary | ICD-10-CM | POA: Diagnosis present

## 2021-07-27 DIAGNOSIS — I483 Typical atrial flutter: Secondary | ICD-10-CM | POA: Diagnosis not present

## 2021-07-27 DIAGNOSIS — I4891 Unspecified atrial fibrillation: Secondary | ICD-10-CM | POA: Diagnosis present

## 2021-07-27 DIAGNOSIS — I951 Orthostatic hypotension: Secondary | ICD-10-CM | POA: Diagnosis not present

## 2021-07-27 DIAGNOSIS — R42 Dizziness and giddiness: Secondary | ICD-10-CM | POA: Diagnosis present

## 2021-07-27 DIAGNOSIS — Z7901 Long term (current) use of anticoagulants: Secondary | ICD-10-CM

## 2021-07-27 DIAGNOSIS — R112 Nausea with vomiting, unspecified: Secondary | ICD-10-CM | POA: Diagnosis not present

## 2021-07-27 DIAGNOSIS — Z79899 Other long term (current) drug therapy: Secondary | ICD-10-CM

## 2021-07-27 DIAGNOSIS — K219 Gastro-esophageal reflux disease without esophagitis: Secondary | ICD-10-CM | POA: Diagnosis present

## 2021-07-27 DIAGNOSIS — Z8616 Personal history of COVID-19: Secondary | ICD-10-CM | POA: Diagnosis not present

## 2021-07-27 DIAGNOSIS — Z882 Allergy status to sulfonamides status: Secondary | ICD-10-CM

## 2021-07-27 DIAGNOSIS — Z981 Arthrodesis status: Secondary | ICD-10-CM

## 2021-07-27 DIAGNOSIS — G4733 Obstructive sleep apnea (adult) (pediatric): Secondary | ICD-10-CM | POA: Diagnosis not present

## 2021-07-27 DIAGNOSIS — Z9109 Other allergy status, other than to drugs and biological substances: Secondary | ICD-10-CM

## 2021-07-27 DIAGNOSIS — I471 Supraventricular tachycardia: Secondary | ICD-10-CM | POA: Diagnosis not present

## 2021-07-27 DIAGNOSIS — F411 Generalized anxiety disorder: Secondary | ICD-10-CM | POA: Diagnosis present

## 2021-07-27 DIAGNOSIS — I10 Essential (primary) hypertension: Secondary | ICD-10-CM | POA: Diagnosis present

## 2021-07-27 DIAGNOSIS — I480 Paroxysmal atrial fibrillation: Secondary | ICD-10-CM | POA: Diagnosis not present

## 2021-07-27 DIAGNOSIS — I4819 Other persistent atrial fibrillation: Principal | ICD-10-CM | POA: Diagnosis present

## 2021-07-27 DIAGNOSIS — I4892 Unspecified atrial flutter: Secondary | ICD-10-CM | POA: Diagnosis not present

## 2021-07-27 DIAGNOSIS — K529 Noninfective gastroenteritis and colitis, unspecified: Secondary | ICD-10-CM | POA: Diagnosis present

## 2021-07-27 DIAGNOSIS — E7800 Pure hypercholesterolemia, unspecified: Secondary | ICD-10-CM | POA: Diagnosis not present

## 2021-07-27 DIAGNOSIS — Z881 Allergy status to other antibiotic agents status: Secondary | ICD-10-CM

## 2021-07-27 DIAGNOSIS — Z888 Allergy status to other drugs, medicaments and biological substances status: Secondary | ICD-10-CM

## 2021-07-27 DIAGNOSIS — Z96641 Presence of right artificial hip joint: Secondary | ICD-10-CM | POA: Diagnosis not present

## 2021-07-27 DIAGNOSIS — F32A Depression, unspecified: Secondary | ICD-10-CM | POA: Diagnosis present

## 2021-07-27 DIAGNOSIS — Z79891 Long term (current) use of opiate analgesic: Secondary | ICD-10-CM

## 2021-07-27 DIAGNOSIS — J45909 Unspecified asthma, uncomplicated: Secondary | ICD-10-CM | POA: Diagnosis not present

## 2021-07-27 DIAGNOSIS — Q761 Klippel-Feil syndrome: Secondary | ICD-10-CM

## 2021-07-27 DIAGNOSIS — K8681 Exocrine pancreatic insufficiency: Secondary | ICD-10-CM | POA: Diagnosis present

## 2021-07-27 DIAGNOSIS — R002 Palpitations: Secondary | ICD-10-CM | POA: Diagnosis present

## 2021-07-27 DIAGNOSIS — K5730 Diverticulosis of large intestine without perforation or abscess without bleeding: Secondary | ICD-10-CM | POA: Diagnosis present

## 2021-07-27 DIAGNOSIS — F4310 Post-traumatic stress disorder, unspecified: Secondary | ICD-10-CM | POA: Diagnosis present

## 2021-07-27 DIAGNOSIS — E876 Hypokalemia: Secondary | ICD-10-CM | POA: Diagnosis not present

## 2021-07-27 DIAGNOSIS — R079 Chest pain, unspecified: Secondary | ICD-10-CM | POA: Diagnosis not present

## 2021-07-27 DIAGNOSIS — Z818 Family history of other mental and behavioral disorders: Secondary | ICD-10-CM

## 2021-07-27 DIAGNOSIS — K8689 Other specified diseases of pancreas: Secondary | ICD-10-CM | POA: Diagnosis not present

## 2021-07-27 DIAGNOSIS — Z880 Allergy status to penicillin: Secondary | ICD-10-CM

## 2021-07-27 DIAGNOSIS — K5900 Constipation, unspecified: Secondary | ICD-10-CM | POA: Diagnosis present

## 2021-07-27 DIAGNOSIS — Z885 Allergy status to narcotic agent status: Secondary | ICD-10-CM

## 2021-07-27 DIAGNOSIS — R001 Bradycardia, unspecified: Secondary | ICD-10-CM | POA: Diagnosis present

## 2021-07-27 DIAGNOSIS — Z5181 Encounter for therapeutic drug level monitoring: Secondary | ICD-10-CM

## 2021-07-27 DIAGNOSIS — Z91013 Allergy to seafood: Secondary | ICD-10-CM

## 2021-07-27 LAB — URINALYSIS, ROUTINE W REFLEX MICROSCOPIC
Bilirubin Urine: NEGATIVE
Glucose, UA: NEGATIVE mg/dL
Ketones, ur: 5 mg/dL — AB
Nitrite: NEGATIVE
Protein, ur: NEGATIVE mg/dL
Specific Gravity, Urine: 1.004 — ABNORMAL LOW (ref 1.005–1.030)
pH: 7 (ref 5.0–8.0)

## 2021-07-27 LAB — COMPREHENSIVE METABOLIC PANEL
ALT: 11 U/L (ref 0–44)
AST: 19 U/L (ref 15–41)
Albumin: 4.4 g/dL (ref 3.5–5.0)
Alkaline Phosphatase: 125 U/L (ref 38–126)
Anion gap: 13 (ref 5–15)
BUN: 12 mg/dL (ref 8–23)
CO2: 22 mmol/L (ref 22–32)
Calcium: 9.8 mg/dL (ref 8.9–10.3)
Chloride: 104 mmol/L (ref 98–111)
Creatinine, Ser: 0.95 mg/dL (ref 0.44–1.00)
GFR, Estimated: >60 mL/min (ref >60)
Glucose, Bld: 140 mg/dL — ABNORMAL HIGH (ref 70–99)
Potassium: 3.7 mmol/L (ref 3.5–5.1)
Sodium: 139 mmol/L (ref 135–145)
Total Bilirubin: 0.9 mg/dL (ref 0.3–1.2)
Total Protein: 7.9 g/dL (ref 6.5–8.1)

## 2021-07-27 LAB — CBC
HCT: 46.9 % — ABNORMAL HIGH (ref 36.0–46.0)
Hemoglobin: 15.9 g/dL — ABNORMAL HIGH (ref 12.0–15.0)
MCH: 29.3 pg (ref 26.0–34.0)
MCHC: 33.9 g/dL (ref 30.0–36.0)
MCV: 86.5 fL (ref 80.0–100.0)
Platelets: 467 10*3/uL — ABNORMAL HIGH (ref 150–400)
RBC: 5.42 MIL/uL — ABNORMAL HIGH (ref 3.87–5.11)
RDW: 13.2 % (ref 11.5–15.5)
WBC: 8.5 10*3/uL (ref 4.0–10.5)
nRBC: 0 % (ref 0.0–0.2)

## 2021-07-27 LAB — RESP PANEL BY RT-PCR (FLU A&B, COVID) ARPGX2
Influenza A by PCR: NEGATIVE
Influenza B by PCR: NEGATIVE
SARS Coronavirus 2 by RT PCR: NEGATIVE

## 2021-07-27 LAB — TROPONIN I (HIGH SENSITIVITY)
Troponin I (High Sensitivity): 9 ng/L (ref <18)
Troponin I (High Sensitivity): 9 ng/L (ref <18)

## 2021-07-27 LAB — LIPASE, BLOOD: Lipase: 29 U/L (ref 11–51)

## 2021-07-27 MED ORDER — DILTIAZEM HCL 25 MG/5ML IV SOLN
10.0000 mg | Freq: Once | INTRAVENOUS | Status: AC
  Administered 2021-07-27: 10 mg via INTRAVENOUS
  Filled 2021-07-27: qty 5

## 2021-07-27 MED ORDER — LACTATED RINGERS IV BOLUS
1000.0000 mL | Freq: Once | INTRAVENOUS | Status: AC
  Administered 2021-07-27: 1000 mL via INTRAVENOUS

## 2021-07-27 MED ORDER — ONDANSETRON HCL 4 MG PO TABS: 4.0000 mg | ORAL_TABLET | Freq: Four times a day (QID) | ORAL | Status: DC | PRN

## 2021-07-27 MED ORDER — ACETAMINOPHEN 325 MG PO TABS: 650.0000 mg | ORAL_TABLET | Freq: Four times a day (QID) | ORAL | Status: AC | PRN

## 2021-07-27 MED ORDER — HEPARIN SODIUM (PORCINE) 5000 UNIT/ML IJ SOLN
5000.0000 [IU] | Freq: Three times a day (TID) | INTRAMUSCULAR | Status: DC
  Administered 2021-07-27: 5000 [IU] via SUBCUTANEOUS
  Filled 2021-07-27: qty 1

## 2021-07-27 MED ORDER — DILTIAZEM HCL 25 MG/5ML IV SOLN
10.0000 mg | Freq: Once | INTRAVENOUS | Status: DC
Start: 2021-07-27 — End: 2021-07-27

## 2021-07-27 MED ORDER — DILTIAZEM HCL-DEXTROSE 125-5 MG/125ML-% IV SOLN (PREMIX): 5.0000 mg/h | INTRAVENOUS | Status: DC

## 2021-07-27 MED ORDER — ALPRAZOLAM 0.5 MG PO TABS
1.0000 mg | ORAL_TABLET | Freq: Every day | ORAL | Status: DC
  Administered 2021-07-27 – 2021-07-31 (×5): 1 mg via ORAL
  Filled 2021-07-27 (×5): qty 2

## 2021-07-27 MED ORDER — LACTATED RINGERS IV BOLUS
1000.0000 mL | Freq: Once | INTRAVENOUS | Status: AC
Start: 2021-07-27 — End: 2021-07-27
  Administered 2021-07-27: 1000 mL via INTRAVENOUS

## 2021-07-27 MED ORDER — ONDANSETRON HCL 4 MG/2ML IJ SOLN: 4.0000 mg | Freq: Four times a day (QID) | INTRAMUSCULAR | Status: DC | PRN

## 2021-07-27 MED ORDER — DILTIAZEM HCL-DEXTROSE 125-5 MG/125ML-% IV SOLN (PREMIX)
5.0000 mg/h | INTRAVENOUS | Status: DC
  Administered 2021-07-27: 5 mg/h via INTRAVENOUS
  Filled 2021-07-27: qty 125

## 2021-07-27 MED ORDER — DILTIAZEM HCL 60 MG PO TABS
30.0000 mg | ORAL_TABLET | Freq: Once | ORAL | Status: AC
  Administered 2021-07-27: 30 mg via ORAL
  Filled 2021-07-27: qty 1

## 2021-07-27 MED ORDER — ACETAMINOPHEN 650 MG RE SUPP: 650.0000 mg | Freq: Four times a day (QID) | RECTAL | Status: AC | PRN

## 2021-07-27 NOTE — ED Triage Notes (Signed)
Pt comes pov with diarrhea about 6 days ago. Started new diet her GI with no sugars/carbs due to pancreas issues. . Chest tightness/shob. Hx of anemia.  ?

## 2021-07-27 NOTE — ED Notes (Signed)
Pt stating she knows when she is in Afib and has been in rvr for approx 6 days now. Pt also stating N/V/D for over a week. Pt believes it may be Cdif but has not been on any abx recently.  ?

## 2021-07-27 NOTE — ED Provider Notes (Signed)
[facility] Provider Note    Event Date/Time   First MD Initiated Contact with Patient 07/27/21 1523     (approximate)   History   Diarrhea and Chest Pain   HPI  [name] is a 61 y.o. female patient started a new diet with no carbs or sugars for her pancreas problems about 6 days ago.  Shortly after that she began having very frequent watery diarrhea. She developed chest pain with shortness of breath and her A-fib has been acting up.  She denies chest pain shortness of breath when she gets up and tries to move around.  She becomes tachycardic 2.      Physical Exam   Triage Vital Signs: ED Triage Vitals  Enc Vitals Group     BP 07/27/21 1403 (!) 135/91     Pulse Rate 07/27/21 1403 80     Resp 07/27/21 1403 18     Temp 07/27/21 1403 98 F (36.7 C)     Temp Source 07/27/21 1403 Oral     SpO2 07/27/21 1403 96 %     Weight 07/27/21 1402 154 lb (69.9 kg)     Height --      Head Circumference --      Peak Flow --      Pain Score 07/27/21 1402 8     Pain Loc --      Pain Edu? --      Excl. in [place]? --     Most recent vital signs: Vitals:   07/27/21 2008 07/27/21 2230  BP: 110/72 107/74  Pulse: (!) 103 80  Resp: 16 11  Temp:    SpO2: 96% 99%    = General: Awake, no distress.  While resting.  When she sits up in bed her heart rate goes from 10 9-1 26 or higher. CV:  Good peripheral perfusion.  Heart irregularly irregular rate and rhythm no audible murmurs Resp:  Normal effort.  Lungs are clear Abd:  No distention.  Soft bowel sounds are positive there is no tenderness no organomegaly    ED Results / Procedures / Treatments   Labs (all labs ordered are listed, but only abnormal results are displayed) Labs Reviewed  COMPREHENSIVE METABOLIC PANEL - Abnormal; Notable for the following components:      Result Value   Glucose, Bld 140 (*)    All other components within normal limits  CBC - Abnormal; Notable for the following  components:   RBC 5.42 (*)    Hemoglobin 15.9 (*)    HCT 46.9 (*)    Platelets 467 (*)    All other components within normal limits  URINALYSIS, ROUTINE W REFLEX MICROSCOPIC - Abnormal; Notable for the following components:   Color, Urine STRAW (*)    APPearance CLEAR (*)    Specific Gravity, Urine 1.004 (*)    Hgb urine dipstick MODERATE (*)    Ketones, ur 5 (*)    Leukocytes,Ua TRACE (*)    Bacteria, UA RARE (*)    All other components within normal limits  RESP PANEL BY RT-PCR (FLU A&B, COVID) [id number]  GASTROINTESTINAL PANEL BY PCR, STOOL (REPLACES STOOL CULTURE)  C DIFFICILE QUICK SCREEN W PCR REFLEX    LIPASE, BLOOD  BASIC METABOLIC PANEL  CBC  HIV ANTIBODY (ROUTINE TESTING W REFLEX)  TROPONIN I (HIGH SENSITIVITY)  TROPONIN I (HIGH SENSITIVITY)     EKG  EKG read interpreted by me shows A-fib with RVR left axis nonspecific  ST-T changes   RADIOLOGY    PROCEDURES:  Critical Care performed:   Procedures   MEDICATIONS ORDERED IN ED: Medications  diltiazem (CARDIZEM) 125 mg in dextrose 5% 125 mL (1 mg/mL) infusion (5 mg/hr Intravenous New Bag/Given 07/27/21 2003)  ALPRAZolam Duanne Moron) tablet 1 mg (1 mg Oral Given 07/27/21 2130)  acetaminophen (TYLENOL) tablet 650 mg (has no administration in time range)    Or  acetaminophen (TYLENOL) suppository 650 mg (has no administration in time range)  ondansetron (ZOFRAN) tablet 4 mg (has no administration in time range)    Or  ondansetron (ZOFRAN) injection 4 mg (has no administration in time range)  heparin injection 5,000 Units (5,000 Units Subcutaneous Given 07/27/21 2130)  lactated ringers bolus 1,000 mL (0 mLs Intravenous Stopped 07/27/21 1741)  lactated ringers bolus 1,000 mL (0 mLs Intravenous Stopped 07/27/21 1741)  diltiazem (CARDIZEM) tablet 30 mg (30 mg Oral Given 07/27/21 1813)  diltiazem (CARDIZEM) injection 10 mg (10 mg Intravenous Given 07/27/21 2001)     IMPRESSION / MDM / ASSESSMENT AND PLAN / ED COURSE  I  reviewed the triage vital signs and the nursing notes. Patient's heart rate has come down just with fluids.  She is taking diltiazem for her A-fib which is intermittent.  I will give her some more fluids.  We will try to get a stool specimen as well.  She says her stool does not stink.  Possibly this is due to her high-fiber diet changes started.  With no pain and no fever and no white count think we need to get a CAT scan.  Patient only gets chest pain and shortness of breath with movement.  This is probably rate related and possibly due to her dehydration.  If she is not controlling her rate after 2 L of fluid we will try some diltiazem IV.  Normal wanting to start diltiazem sooner because I do not want to drop her breath blood pressure if her rapid rate is what is keeping the pressure out due to dehydration.  ----------------------------------------- 5:48 PM on 07/27/2021 ----------------------------------------- Patient thinks she can eat.  She thinks if she eats she can give [place] a stool specimen.  That would be helpful.  However her heart rate is now up to the 130s.  We will try some IV diltiazem drip.  If she tolerates p.o. we will try p.o. diltiazem if that keeps her heart rate down we will try to let her go follow-up with GI and her regular doctor. Review of her old records shows that she takes diltiazem Cardizem CD 120 and 30 mg as needed.  We will give her 30 mg as needed now.  ----------------------------------------- 12:38 AM on 07/28/2021 ----------------------------------------- Patient had no effect of the Cardizem 30 mg immediately release that we gave her in fact her heart rate gradually climb.  I believe that she is not absorbing the Cardizem due to her diarrhea.  I had to start her on diltiazem IV.  We gave her a bolus and drip.  We then admitted the patient.  The patient is on the cardiac monitor to evaluate for evidence of arrhythmia and/or significant heart rate changes.  Patient  had A-fib with RVR with gradually increasing heart rate.  Troponins were negative urine was clear the rest of her blood work was negative.   FINAL CLINICAL IMPRESSION(S) / ED DIAGNOSES   Final diagnoses:  Diarrhea, unspecified type  Atrial fibrillation with RVR ([place])     Rx / DC Orders  ED Discharge Orders     None        Note:  This document was prepared using Dragon voice recognition software and may include unintentional dictation errors.   Nena Polio, MD 07/28/21 0040

## 2021-07-27 NOTE — ED Notes (Signed)
Patient able to tolerate po intake ?

## 2021-07-27 NOTE — H&P (Signed)
History and Physical   [name] GQQ:[id number] DOB: 11-Jun-1960 DOA: 07/27/2021  PCP: Virginia Crews, MD Outpatient Specialists: Dr. Marius Ditch, gastroenterologist  Patient coming from: home   I have personally briefly reviewed patient's old medical records in [place].  Chief Concern: nausea, vomiting, diarrhea  HPI: [name] is a 61 year old female with paroxysmal atrial fibrillation and atrial flutter status post ablation on 08/06/2020 currently on Eliquis 5 mg p.o. twice daily, depression, anxiety, muscle spasms, GERD, hypertension, who presents emergency department for chief concerns of intractable nausea and vomiting and diarrhea for 6 days.  In the ED, serum sodium 139, potassium 3.7, chloride 104, bicarb 22, BUN of 12, serum creatinine 0.95, GFR greater than 60, nonfasting blood glucose 140, WBC 8.5, hemoglobin 15.9, platelets of 467.  High-sensitivity troponin was 9 and on repeat was also 9.  ED treatment: Diltiazem 10 mg IV one-time dose, diltiazem 50 mg p.o.  Initially diltiazem GGT was ordered and discontinued by ED provider.  LR 1 L bolus, x2 have been ordered.  At bedside, she is able to tell me her name, age, and she knows she is in the hospital.   She comes into the hospital for chief concerns of diarrhea x 6 days with increased weakness. She reports she felt that she was going into atrial fibrillation.  She reports she has been taking her home diltiazem as needed for heart rate greater than 120 for the last few days.  She endorses that she feels when she is in A-fib with RVR.  She reports the diarrhea is black and foamy.  She denies bright red blood.  She reports his recent iron infusion outpatient for iron deficiency anemia.  She reports that she does not vomit due to history of nissen fundoplication procedure. She has been having increase dry heaves.   She reports that she usually has constipation and usually has to take miralax for her to have a  bowel movement. She reports she has not taken linzess or miralax in at least 5 days.  She endorses subjective fever at home, with a t max of 99.9 and diaphoresis and then chills.   She denies cough. She endorses chest pain whenever she feels herself go into atrial fibrillation.   She endorses compliance with blood pressure medications. She reports she did not call her GI provider regarding diarrhea, instead called her pcp and was advised to come to the ED. She denies recent abx use.  She denies dysuria, hematuria. She reports black stool, foamy, liquid.   Social history: She lives with her husband and grandchildren. She denies tobacco use, etoh, recreational drug use. 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   Vaccination history: She is vaccinated for covid 19 and influenza.   ROS: Constitutional: no weight change, + fever ENT/Mouth: no sore throat, no rhinorrhea Eyes: no eye pain, no vision changes Cardiovascular: no chest pain, no dyspnea,  no edema, no palpitations Respiratory: no cough, no sputum, no wheezing Gastrointestinal: + nausea, no vomiting, + diarrhea, no constipation Genitourinary: no urinary incontinence, no dysuria, no hematuria Musculoskeletal: no arthralgias, no myalgias Skin: no skin lesions, no pruritus, Neuro: + weakness, no loss of consciousness, no syncope Psych: no anxiety, no depression, + decrease appetite Heme/Lymph: no bruising, no bleeding  ED Course: Discussed with emergency medicine provider, patient requiring hospitalization for chief concerns of atrial fibrillation with RVR  Assessment/Plan  Principal Problem:   Intractable nausea and vomiting Active Problems:   Cervical fusion  syndrome   Chronic anticoagulation (Eliquis)   Anxiety state   Clinical depression   Colon, diverticulosis   OSA (obstructive sleep apnea)   Hypertension   Pure hypercholesterolemia   Chronic use of opiate for therapeutic purpose   Atrial fibrillation with  RVR (HCC)   Diarrhea   Assessment and Plan:  * Intractable nausea and vomiting - Supportive measures - LR 1 L x 2 - LR infusion at 125 mL/h, 1 day ordered - Ondansetron 4 mg p.o. every 6 hours as needed for nausea; ondansetron 4 mg IV every 6 6 hours as needed for nausea and vomiting - BMP in the a.m.  Cervical fusion syndrome - Resumed home morphine 15 mg every 6 hours as needed for moderate and severe pain  Diarrhea Intractable nausea - Etiology work-up in progress, query gastroenteritis - C. difficile and GI panel PCR ordered and pending collection - Toilet hat in place for stool collection - Discussed with patient at bedside that she will need to provide a sample in order for [place] to make a diagnosis  Atrial fibrillation with RVR ([place]) - Resumed home apixaban 5 mg p.o. twice daily - Resumed home diltiazem 30 mg every 6 hours as needed for heart rate greater than 120  Hypertension - Resumed home diltiazem 120 mg p.o. daily  Clinical depression - Buspirone 15 mg p.o. twice daily resumed - Resumed home alprazolam 1 mg nightly  Chart reviewed.   Outpatient GI 07/14/2021: Patient is seen by GI for management of exocrine pancreatic insufficiency.  Patient is status post Nissan fundoplication in 0034 and status postcholecystectomy.  Follows with outpatient GI for symptoms of postprandial abdominal discomfort, bloating, alternating episodes of constipation and diarrhea with weight gain.  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  For chronic constipation: Her TSH was within normal limits.  Patient was advised to eliminate artificial sweeteners, iced tea regular intake of starchy foods.  Advised to consume high-fiber diet and fiber supplements.  Advised to cut back her MiraLAX to 2 capfuls daily and advised regular exercise/walking to lose some weight.  For moderate exocrine pancreatic insufficiency: Per GI, no evidence of pancreatic  lesions based on cross-sectional imaging.  Patient was counseled to take Creon 36 K, 1 to 2 capsules with each meal and 1 with snack.  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    Chronic headaches    Chronic, continuous use of opioids    DDD (degenerative disc disease)    CERVICAL AND LUMBAR-CHRONIC PAIN, RT HIP LABRAL TEAR   Depression    PT STATES A LOT OF STRESS IN HER LIFE   Dissociative disorder    Dizziness 04/22/2013   Duodenal ulcer with hemorrhage and perforation ([place]) 04/27/2003   Foot drop, right    FROM  BACK SURGERY   GERD (gastroesophageal reflux disease)    H/O arthrodesis 08/18/2014   Headache(784.0)    AND NECK PAIN--STATES RECENT TEST SHOW CERVICAL DEGENERATION   History of blood transfusion    s/p back surgery   History of cardiac cath    a. 05/2019 Cath: Nl cors. EF 55-65%.   History of cardioversion    History of cervical spinal surgery 01/04/2015   History of kidney stones    Hypertension    Inverted T wave    Iron deficiency 02/01/2021   Mitral regurgitation    a. 07/2020 Echo: EF 60-65%, no rwma. Nl RV size/fxn. RVSP 39.19mHg. Mildly to mod dil LA. Mod MR; b. 07/2020 TEE: EF 55-60%. Lambl's excresence. Nl RV size/fxn. Midly dil LA. No LA/LAA thrombus. Mild MR.   Narrowing of  intervertebral disc space 08/18/2014   Currently on disability.    Orthostatic hypotension 04/22/2013   Pain    CHRONIC NECK AND BACK PAIN - LIMITED ROM NECK - S/P FUSIONS CERVICAL AND LUMBAR   Paroxysmal atrial fibrillation ([place]   a. 07/2020 s/p PVI.   Pneumonia    PONV (postoperative nausea and vomiting)    PT GIVES HX OF N&V AND FEVER WITH SURGERIES YEARS AGO--BUT NO PROBLEMS WITH MORE RECENT SURGERIES--STATES NOT MALIGNANT HYPERTHERMIA   Postop Hyponatremia 05/14/2012   Postoperative anemia due to acute blood loss 05/14/2012   PTSD (post-traumatic stress disorder)    Right foot drop    Right hip arthralgia 08/18/2014   Status post surgery of right, and now needs left.    Sleep apnea 2021   does not have a cpap   Therapeutic opioid-induced constipation (OIC)    Typical atrial flutter ([facility]    Past Surgical History:  Procedure Laterality Date   ABDOMINAL HYSTERECTOMY     afib  05/2019   ANTERIOR CERVICAL DECOMP/DISCECTOMY FUSION N/A 10/30/2012   Procedure: ACDF C5-6, EXPLORATION AND HARDWARE REMOVAL C6-7;  Surgeon: DMelina Schools MD;  [place]  Service: Orthopedics;  Laterality: N/A;   [place]IOR FUS[place]ICAL [place]  [place]Y [place]   [place]   Procedure: ATRIAL FIBRILLATION ABLATION;  Surgeon: LVickie Epley MD;  [place];  Service: Cardiovascular;  Laterality: N/A;   BACK SURGERY  2009   LUMBAR FUSION WITH RODS    BREAST BIOPSY Right 2008   benign.- bx/clip   BREAST EXCISIONAL BIOPSY Left 1998   benign   BREAST REDUCTION SURGERY Bilateral 06/2016   CARDIAC ELECTROPHYSIOLOGY MAPPING AND ABLATION     CARDIOVERSION N/A 12/03/2019   Procedure: CARDIOVERSION;  Surgeon: AKate Sable MD;  Location: [facility];  Service: Cardiovascular;  Laterality: N/A;   CARPAL TUNNEL RELEASE  05-06-12   Right   CHOLECYSTECTOMY     COLONOSCOPY WITH PROPOFOL N/A 01/26/2017   Procedure: COLONOSCOPY WITH PROPOFOL;   Surgeon: WLucilla Lame MD;  [place]  Service: Endoscopy;  Laterality: N/A;   COLONOSCOPY WITH PROPOFOL N/A 01/16/2021   Procedure: COLONOSCOPY WITH PROPOFOL;  Surgeon: VLin Landsman MD;  [facility];  Service: Gastroenterology;  Laterality: N/A;   DIAGNOSTIC LAPAROSCOPIES - MULTIPLE FOR ENDOMETRIOSIS     ESOPHAGEAL DILATION N/A 01/26/2017   Procedure: ESOPHAGEAL DILATION;  Surgeon: WLucilla Lame MD;  [place]  Service: Endoscopy;  Laterality: N/A;   ESOPHAGEAL MANOMETRY N/A 05/30/2017   Procedure: ESOPHAGEAL MANOMETRY (EM);  Surgeon: WLucilla Lame MD;  Location: [facility];  Service:  Endoscopy;  Laterality: N/A;   ESOPHAGOGASTRODUODENOSCOPY (EGD) WITH PROPOFOL N/A 01/26/2017   Procedure: ESOPHAGOGASTRODUODENOSCOPY (EGD) WITH PROPOFOL;  Surgeon: Lucilla Lame, MD;  Location: [place];  Service: Endoscopy;  Laterality: N/A;   ESOPHAGOGASTRODUODENOSCOPY (EGD) WITH PROPOFOL N/A 01/30/2020   Procedure: ESOPHAGOGASTRODUODENOSCOPY (EGD) WITH PROPOFOL;  Surgeon: Lucilla Lame, MD;  Location: [place];  Service: Endoscopy;  Laterality: N/A;  sleep apnea COVID + 01-15-20   ESOPHAGOGASTRODUODENOSCOPY (EGD) WITH PROPOFOL N/A 01/15/2021   Procedure: ESOPHAGOGASTRODUODENOSCOPY (EGD) WITH PROPOFOL;  Surgeon: Lin Landsman, MD;  Location: [place];  Service: Gastroenterology;  Laterality: N/A;   HIP ARTHROSCOPY  09/20/2011   Procedure: ARTHROSCOPY HIP;  Surgeon: Gearlean Alf, MD;  [facility];  Service: Orthopedics;  Laterality: Right;  Right Hip Scope with Labral Debridement   LEFT HEART CATH AND CORONARY ANGIOGRAPHY Left 06/10/2019   Procedure: LEFT HEART CATH AND CORONARY ANGIOGRAPHY;  Surgeon: Nelva Bush, MD;  Location: [place] [facility];  Service: Cardiovascular;  Laterality: Left;   NASAL SEPTUM SURGERY  MARCH 2013   IN [place]   [facility] IMPEDANCE STUDY N/A 05/30/2017   Procedure: [place] IMPEDANCE  STUDY;  Surgeon: Lucilla Lame, MD;  Location: [facility];  Service: Endoscopy;  Laterality: N/A;   POLYPECTOMY N/A 01/26/2017   Procedure: POLYPECTOMY;  Surgeon: Lucilla Lame, MD;  Location: [place];  Service: Endoscopy;  Laterality: N/A;   POSTERIOR CERVICAL FUSION/FORAMINOTOMY N/A 04/16/2013   Procedure: REMOVAL CERVICAL PLATES AND INTERBODY CAGE/POSTERIOR CERVICAL SPINAL FUSION C4 - C6/C5 CORPECTOMY/C4 - C6 FUSION WITH ILIAC CREST BONE GRAFT;  Surgeon: Melina Schools, MD;  Location: [place];  Service: Orthopedics;  Laterality: N/A;   RADIOFREQUENCY ABLATION NERVES     REDUCTION MAMMAPLASTY Bilateral 05/2016   RIGHT HIP ARTHROSCOPY FOR LABRAL TEAR  ABOUT 2010   2012 also   SHOULDER ARTHROSCOPY  05-06-12   bone spur   TEE WITHOUT CARDIOVERSION N/A 08/05/2020   Procedure: TRANSESOPHAGEAL ECHOCARDIOGRAM (TEE);  Surgeon: Lelon Perla, MD;  [facility];  Service: Cardiovascular;  Laterality: N/A;   TONSILLECTOMY     AS A CHILD   TOTAL HIP ARTHROPLASTY Right 05/13/2012   Procedure: TOTAL HIP ARTHROPLASTY ANTERIOR APPROACH;  Surgeon: Gearlean Alf, MD;  [facility];  Service: Orthopedics;  Laterality: Right;   Social History:  reports that she has never smoked. She has never used smokeless tobacco. She reports current drug use. She reports that she does not drink alcohol.  Allergies  Allergen Reactions   Amoxicillin Hives    She did ok w ANCEF   Chlorhexidine Gluconate Dermatitis and Hives   Clindamycin Hives   Codeine Hives   Erythromycin Hives    "mycins" in general   Penicillin G Hives    "cillins" in general   Sulfa Antibiotics Nausea And Vomiting and Hives        Levofloxacin Hives   Shellfish Allergy Hives   Decadron [Dexamethasone] Other (See Comments)    Hot flashes, insomnia, "manic" Hot flashes, insomnia, "manic"   Mangifera Indica Hives    papaya   Papaya Derivatives Hives   Betadine [Povidone Iodine] Hives        Clarithromycin Hives    Other Hives     Mango    Povidone-Iodine Hives   Prednisone Anxiety    High blood pressure, flushed, mood changes, heart palpitations High blood pressure, flushed, mood changes, heart palpitations   Family History  Problem Relation Age of Onset   Aneurysm Mother  Bipolar disorder Sister    Aneurysm Maternal Grandmother    Breast cancer Paternal Grandmother 53   Bipolar disorder Grandchild    Anxiety disorder Grandchild    Depression Grandchild    Cancer Neg Hx    Family history: Family history reviewed and not pertinent.  Prior to Admission medications   Medication Sig Start Date End Date Taking? Authorizing Provider  ALPRAZolam Duanne Moron) 1 MG tablet Take 1 mg by mouth at bedtime.  07/04/19   [provider]  apixaban (ELIQUIS) 5 MG TABS tablet Take 1 tablet (5 mg total) by mouth 2 (two) times daily. 04/12/21   Kate Sable, MD  busPIRone (BUSPAR) 7.5 MG tablet Take 7.5 mg by mouth 2 (two) times daily. 05/06/20   [provider]  CREON [id number] units CPEP capsule Take 2 capsules with the first bite of each meal and 1 capsule with the first bite of each snack 01/27/21   Lin Landsman, MD  cyclobenzaprine (FLEXERIL) 10 MG tablet Take 10 mg by mouth 3 (three) times daily. 06/18/21   [provider]  dicyclomine (BENTYL) 20 MG tablet  02/16/21   [provider]  diltiazem (CARDIZEM CD) 120 MG 24 hr capsule Take 1 capsule (120 mg total) by mouth daily. 06/27/21   Kate Sable, MD  diltiazem (CARDIZEM) 30 MG tablet Take 1 tablet (30 mg total) by mouth every 6 (six) hours as needed. 07/21/21   Kate Sable, MD  linaclotide Rolan Lipa) 145 MCG CAPS capsule TAKE 1 CAPSULE BY MOUTH DAILY BEFORE BREAKFAST. 06/03/21   Lucilla Lame, MD  Menthol, Topical Analgesic, (BENGAY EX) Apply 1 application topically daily as needed (pain).    [provider]  montelukast (SINGULAIR) 10 MG tablet TAKE 1 TABLET BY MOUTH EVERY DAY 03/08/21   Tally Joe T, FNP  morphine (MSIR) 15 MG tablet Take 1 tablet (15 mg total) by mouth every 6 (six) hours as needed for moderate pain or severe pain. Must last 30 days. 06/22/21 07/22/21  Milinda Pointer, MD  morphine (MSIR) 15 MG tablet Take 1 tablet (15 mg total) by mouth every 6 (six) hours as needed for moderate pain or severe pain. Must last 30 days. Patient not taking: Reported on 07/14/2021 07/22/21 08/21/21  Milinda Pointer, MD  morphine (MSIR) 15 MG tablet Take 1 tablet (15 mg total) by mouth every 6 (six) hours as needed for moderate pain or severe pain. Must last 30 days. Patient not taking: Reported on 07/14/2021 08/21/21 09/20/21  Milinda Pointer, MD  naphazoline-pheniramine (VISINE) 0.025-0.3 % ophthalmic solution 1 drop 4 (four) times daily as needed for eye irritation.    [provider]  pantoprazole (PROTONIX) 40 MG tablet TAKE 1 TABLET BY MOUTH TWICE A DAY 06/03/21   Lucilla Lame, MD  polyethylene glycol (MIRALAX / GLYCOLAX) 17 g packet Take 17 g by mouth daily.    [provider]  sodium chloride (OCEAN) 0.65 % SOLN nasal spray Place 1 spray into both nostrils daily as needed for congestion ([place]ry[place]).    [provider]  valACYclovir (VALTREX) 1000 MG tablet TAKE TWO TABLETS BY MOUTH TWICE DAILY FOR ONE DAY FEVER BLISTER 06/13/21   Virginia Crews, MD   Physical Exam: Vitals:   07/28/21 0000 07/28/21 0030 07/28/21 0100 07/28/21 0130  BP: 104/74 107/68 107/74 116/88  Pulse: 80 74 85 77  Resp: '13 18 16 13  '$ Temp:      TempSrc:      SpO2: 97% 97% 95% 98%  Weight:       Constitutional: appears age appropriate, NAD, calm, comfortable Eyes: PERRL, lids and conjunctivae normal ENMT: Mucous membranes are moist. Posterior pharynx clear of any exudate or lesions. Age-appropriate dentition. Hearing appropriate Neck: normal, supple, no masses, no thyromegaly Respiratory: clear to auscultation bilaterally, no wheezing, no crackles. Normal respiratory effort. No  accessory muscle use.  Cardiovascular: Regular rate and rhythm, no murmurs / rubs / gallops. No extremity edema. 2+ pedal pulses. No carotid bruits.  Abdomen: no tenderness, no masses palpated, no hepatosplenomegaly. Bowel sounds positive.  Musculoskeletal: no clubbing / cyanosis. No joint deformity upper and lower extremities. Good ROM, no contractures, no atrophy. Normal muscle tone.  Skin: no rashes, lesions, ulcers. No induration Neurologic: Sensation intact. Strength 5/5 in all 4.  Psychiatric: Normal judgment and insight. Alert and oriented x 3. Normal mood.   EKG: independently reviewed, showing atrial fibrillation with RVR, rate of 144, QTc 470  Chest x-ray on Admission: I personally reviewed and I agree with radiologist reading as below.  DG Chest Port 1 View  Result Date: 07/27/2021 CLINICAL DATA:  Nausea and vomiting and atrial fibrillation, initial encounter EXAM: PORTABLE CHEST 1 VIEW COMPARISON:  01/15/2021 FINDINGS: Cardiac shadow is stable. The lungs are well aerated bilaterally. No focal infiltrate or effusion is seen. Postsurgical changes are noted in the cervical and thoracolumbar spine stable from the prior study. No new focal abnormality is noted. IMPRESSION: No active disease. Electronically Signed   By: Inez Catalina M.D.   On: 07/27/2021 20:23    Labs on Admission: I have personally reviewed following labs  CBC: Recent Labs  Lab 07/27/21 1405  WBC 8.5  HGB 15.9*  HCT 46.9*  MCV 86.5  PLT 212*   Basic Metabolic Panel: Recent Labs  Lab 07/27/21 1405  NA 139  K 3.7  CL 104  CO2 22  GLUCOSE 140*  BUN 12  CREATININE 0.95  CALCIUM 9.8   GFR: Estimated Creatinine Clearance: 58.9 mL/min (by C-G formula based on SCr of 0.95 mg/dL).  Liver Function Tests: Recent Labs  Lab 07/27/21 1405  AST 19  ALT 11  ALKPHOS 125  BILITOT 0.9  PROT 7.9  ALBUMIN 4.4   Recent Labs  Lab 07/27/21 1405  LIPASE 29   Urine analysis:    Component Value Date/Time    COLORURINE STRAW (A) 07/27/2021 2151   APPEARANCEUR CLEAR (A) 07/27/2021 2151   APPEARANCEUR Hazy (A) 03/24/2021 1513   LABSPEC 1.004 (L) 07/27/2021 2151   PHURINE 7.0 07/27/2021 2151   GLUCOSEU NEGATIVE 07/27/2021 2151   HGBUR MODERATE (A) 07/27/2021 2151   BILIRUBINUR NEGATIVE 07/27/2021 2151   BILIRUBINUR Negative 03/24/2021 1513   KETONESUR 5 (A) 07/27/2021 2151   PROTEINUR NEGATIVE 07/27/2021 2151   UROBILINOGEN 0.2 05/21/2020 1103   UROBILINOGEN 1.0 05/15/2012 0519   NITRITE NEGATIVE 07/27/2021 2151   LEUKOCYTESUR TRACE (A) 07/27/2021 2151   Dr. Tobie Poet [facility]  If 7PM-7AM, please contact overnight-coverage provider If 7AM-7PM, please contact day coverage provider [email or web address]  07/28/2021, 2:03 AM

## 2021-07-27 NOTE — Telephone Encounter (Signed)
Called into [facility] and spoke with [facility] to be sure Dr. Brita Romp was made aware of pt's disposition.   ?

## 2021-07-27 NOTE — Telephone Encounter (Signed)
Reason for Disposition ? [1] Drinking very little AND [2] dehydration suspected (e.g., no urine > 12 hours, very dry mouth, very lightheaded) ? ?Answer Assessment - Initial Assessment Questions ?1. DESCRIPTION: "Describe your dizziness." ?    Pt having lightheadedness and diarrhea for 6 days.   I saw GI dr 2 wks ago.   I have pancreas insuffiency.    I'm very lightheaded feel like passing out.    I'm having diarrhea for 6 days.   I'm so weak.   I'm drinking water.   My BP is low for me.   I've had these problems in past.   I have anemia and had iron infusions before. ?I've had C-diff before.   It smells awful and it's watery.   I'm not eating much. ?2. LIGHTHEADED: "Do you feel lightheaded?" (e.g., somewhat faint, woozy, weak upon standing) ?    Every lightheaded like I could pass out.   I have to put my head between my knees due to the dizziness.    ?3. VERTIGO: "Do you feel like either you or the room is spinning or tilting?" (i.e. vertigo) ?    *No Answer* ?4. SEVERITY: "How bad is it?"  "Do you feel like you are going to faint?" "Can you stand and walk?" ?  - MILD: Feels slightly dizzy, but walking normally. ?  - MODERATE: Feels unsteady when walking, but not falling; interferes with normal activities (e.g., school, work). ?  - SEVERE: Unable to walk without falling, or requires assistance to walk without falling; feels like passing out now.  ?    I can't stand up for long, my chest starts to hurt and I feel like I can't breath. ?5. ONSET:  "When did the dizziness begin?" ?    Going on for 6 days now. ?6. AGGRAVATING FACTORS: "Does anything make it worse?" (e.g., standing, change in head position) ?    Standing up for very long and when getting up from sitting or laying down. ?7. HEART RATE: "Can you tell me your heart rate?" "How many beats in 15 seconds?"  (Note: not all patients can do this)   ?    Not asked ?8. CAUSE: "What do you think is causing the dizziness?" ?    I've had diarrhea for 6 days.   Eaten  very little.  I'm drinking water but having watery diarrhea with an awful smell like C Diff which I've had before. ?9. RECURRENT SYMPTOM: "Have you had dizziness before?" If Yes, ask: "When was the last time?" "What happened that time?" ?    I've been sick like this before with C Diff. ?10. OTHER SYMPTOMS: "Do you have any other symptoms?" (e.g., fever, chest pain, vomiting, diarrhea, bleeding) ?      Nausea, chest pain with standing, shortness of breath, weakness ?11. PREGNANCY: "Is there any chance you are pregnant?" "When was your last menstrual period?" ?      N/A ? ?Protocols used: Dizziness - Lightheadedness-A-AH ? ?Chief Complaint: lightheaded, feels like passing out and watery diarrhea for 6 days.  Very weak.  ?Symptoms: nausea, watery diarrhea, very weak, diarrhea smell like C Diff that she's had before, feelings like passing out at times, chest pain and shortness of breath when standing  ?Frequency: For last 6 days ?Pertinent Negatives: Patient denies passing out but has to put her head between her knees at times. ?Disposition: '[x]'$ ED /'[]'$ Urgent Care (no appt availability in office) / '[]'$ Appointment(In office/virtual)/ '[]'$  [facility]/ '[]'$ Home  Care/ '[]'$ Refused Recommended Disposition /'[]'$ [place] [facility]/ '[]'$  Follow-up with PCP ?Additional Notes: Pt agreeable to going to ED.  Calling son to take her.  If he can't she was agreeable to calling 911.     ?

## 2021-07-27 NOTE — ED Notes (Signed)
Patient reports she has not yet had a BM while in hospital but states that she feels "something brewing." States she does not want any additional food. RN informed patient that urine sample has been ordered, and specimen cup provided to patient.  ?

## 2021-07-27 NOTE — Hospital Course (Signed)
[name] is a 60 year old female with paroxysmal atrial fibrillation and atrial flutter status post ablation on 08/06/2020 currently on Eliquis 5 mg p.o. twice daily, depression, anxiety, muscle spasms, GERD, hypertension, who presents emergency department for chief concerns of intractable nausea and vomiting and diarrhea for 6 days. ? ?In the ED, serum sodium 139, potassium 3.7, chloride 104, bicarb 22, BUN of 12, serum creatinine 0.95, GFR greater than 60, nonfasting blood glucose 140, WBC 8.5, hemoglobin 15.9, platelets of 467. ? ?High-sensitivity troponin was 9 and on repeat was also 9. ? ?ED treatment: Diltiazem 10 mg IV one-time dose, diltiazem 50 mg p.o.  Initially diltiazem GGT was ordered and discontinued by ED provider. ? ?LR 1 L bolus, x2 have been ordered. ?

## 2021-07-28 ENCOUNTER — Observation Stay: Payer: Medicare Other

## 2021-07-28 ENCOUNTER — Other Ambulatory Visit: Payer: Self-pay

## 2021-07-28 DIAGNOSIS — Z981 Arthrodesis status: Secondary | ICD-10-CM | POA: Diagnosis not present

## 2021-07-28 DIAGNOSIS — R112 Nausea with vomiting, unspecified: Secondary | ICD-10-CM | POA: Diagnosis not present

## 2021-07-28 DIAGNOSIS — I700 Atherosclerosis of aorta: Secondary | ICD-10-CM | POA: Diagnosis not present

## 2021-07-28 DIAGNOSIS — R197 Diarrhea, unspecified: Secondary | ICD-10-CM | POA: Diagnosis not present

## 2021-07-28 DIAGNOSIS — I480 Paroxysmal atrial fibrillation: Secondary | ICD-10-CM | POA: Diagnosis not present

## 2021-07-28 LAB — BASIC METABOLIC PANEL
Anion gap: 8 (ref 5–15)
BUN: 8 mg/dL (ref 8–23)
CO2: 24 mmol/L (ref 22–32)
Calcium: 8.9 mg/dL (ref 8.9–10.3)
Chloride: 109 mmol/L (ref 98–111)
Creatinine, Ser: 0.73 mg/dL (ref 0.44–1.00)
GFR, Estimated: >60 mL/min (ref >60)
Glucose, Bld: 107 mg/dL — ABNORMAL HIGH (ref 70–99)
Potassium: 3.5 mmol/L (ref 3.5–5.1)
Sodium: 141 mmol/L (ref 135–145)

## 2021-07-28 LAB — CBC
HCT: 36.8 % (ref 36.0–46.0)
Hemoglobin: 12.5 g/dL (ref 12.0–15.0)
MCH: 29.3 pg (ref 26.0–34.0)
MCHC: 34 g/dL (ref 30.0–36.0)
MCV: 86.4 fL (ref 80.0–100.0)
Platelets: 328 10*3/uL (ref 150–400)
RBC: 4.26 MIL/uL (ref 3.87–5.11)
RDW: 13.2 % (ref 11.5–15.5)
WBC: 5.6 10*3/uL (ref 4.0–10.5)
nRBC: 0 % (ref 0.0–0.2)

## 2021-07-28 LAB — GASTROINTESTINAL PANEL BY PCR, STOOL (REPLACES STOOL CULTURE)

## 2021-07-28 LAB — C DIFFICILE QUICK SCREEN W PCR REFLEX
C Diff antigen: NEGATIVE
C Diff interpretation: NOT DETECTED
C Diff toxin: NEGATIVE

## 2021-07-28 LAB — MRSA NEXT GEN BY PCR, NASAL: MRSA by PCR Next Gen: NOT DETECTED

## 2021-07-28 MED ORDER — ONDANSETRON HCL 4 MG/2ML IJ SOLN
4.0000 mg | Freq: Four times a day (QID) | INTRAMUSCULAR | Status: AC | PRN
  Administered 2021-07-28 (×2): 4 mg via INTRAVENOUS
  Filled 2021-07-28 (×2): qty 2

## 2021-07-28 MED ORDER — ONDANSETRON HCL 4 MG PO TABS
4.0000 mg | ORAL_TABLET | Freq: Four times a day (QID) | ORAL | Status: AC | PRN
  Filled 2021-07-28: qty 1

## 2021-07-28 MED ORDER — DILTIAZEM HCL 30 MG PO TABS: 30.0000 mg | ORAL_TABLET | Freq: Four times a day (QID) | ORAL | Status: DC | PRN

## 2021-07-28 MED ORDER — DILTIAZEM HCL ER COATED BEADS 120 MG PO CP24
120.0000 mg | ORAL_CAPSULE | Freq: Every day | ORAL | Status: DC
  Administered 2021-07-28 – 2021-07-29 (×2): 120 mg via ORAL
  Filled 2021-07-28 (×2): qty 1

## 2021-07-28 MED ORDER — MORPHINE SULFATE 15 MG PO TABS
15.0000 mg | ORAL_TABLET | Freq: Four times a day (QID) | ORAL | Status: DC | PRN
  Administered 2021-07-28 – 2021-08-01 (×12): 15 mg via ORAL
  Filled 2021-07-28 (×12): qty 1

## 2021-07-28 MED ORDER — PANTOPRAZOLE SODIUM 40 MG PO TBEC
40.0000 mg | DELAYED_RELEASE_TABLET | Freq: Two times a day (BID) | ORAL | Status: DC
  Administered 2021-07-28 – 2021-08-01 (×9): 40 mg via ORAL
  Filled 2021-07-28 (×9): qty 1

## 2021-07-28 MED ORDER — LACTATED RINGERS IV SOLN: INTRAVENOUS | Status: AC

## 2021-07-28 MED ORDER — PANCRELIPASE (LIP-PROT-AMYL) 12000-38000 UNITS PO CPEP
72000.0000 [IU] | ORAL_CAPSULE | Freq: Three times a day (TID) | ORAL | Status: DC
  Administered 2021-07-28 – 2021-07-30 (×5): 72000 [IU] via ORAL
  Filled 2021-07-28: qty 6
  Filled 2021-07-28 (×2): qty 2
  Filled 2021-07-28 (×2): qty 6
  Filled 2021-07-28: qty 2
  Filled 2021-07-28 (×2): qty 6

## 2021-07-28 MED ORDER — PANCRELIPASE (LIP-PROT-AMYL) 12000-38000 UNITS PO CPEP: 36000.0000 [IU] | ORAL_CAPSULE | ORAL | Status: DC | PRN

## 2021-07-28 MED ORDER — MONTELUKAST SODIUM 10 MG PO TABS
10.0000 mg | ORAL_TABLET | Freq: Every day | ORAL | Status: DC
  Administered 2021-07-28 – 2021-07-31 (×4): 10 mg via ORAL
  Filled 2021-07-28 (×4): qty 1

## 2021-07-28 MED ORDER — BUSPIRONE HCL 10 MG PO TABS
15.0000 mg | ORAL_TABLET | Freq: Two times a day (BID) | ORAL | Status: DC
  Administered 2021-07-28 – 2021-08-01 (×10): 15 mg via ORAL
  Filled 2021-07-28 (×5): qty 2
  Filled 2021-07-28 (×2): qty 3
  Filled 2021-07-28 (×3): qty 2

## 2021-07-28 MED ORDER — SALINE SPRAY 0.65 % NA SOLN
1.0000 | Freq: Every day | NASAL | Status: DC | PRN
  Administered 2021-07-29: 1 via NASAL
  Filled 2021-07-28 (×2): qty 44

## 2021-07-28 MED ORDER — DILTIAZEM HCL 30 MG PO TABS
30.0000 mg | ORAL_TABLET | Freq: Four times a day (QID) | ORAL | Status: DC | PRN
  Filled 2021-07-28: qty 1

## 2021-07-28 MED ORDER — APIXABAN 5 MG PO TABS
5.0000 mg | ORAL_TABLET | Freq: Two times a day (BID) | ORAL | Status: DC
  Administered 2021-07-28 – 2021-08-01 (×9): 5 mg via ORAL
  Filled 2021-07-28 (×10): qty 1

## 2021-07-28 MED ORDER — PANCRELIPASE (LIP-PROT-AMYL) 36000-114000 UNITS PO CPEP: 36000.0000 [IU] | ORAL_CAPSULE | ORAL | Status: DC

## 2021-07-28 NOTE — Assessment & Plan Note (Signed)
-   Buspirone 15 mg p.o. twice daily resumed - Resumed home alprazolam 1 mg nightly

## 2021-07-28 NOTE — Telephone Encounter (Signed)
Noted  

## 2021-07-28 NOTE — Progress Notes (Signed)
Patient admitted from ED to room 257. A+Ox4. VSS. Assessment completed. No diarrhea and no nausea at this time. Will continue to monitor and assess with plan of care.

## 2021-07-28 NOTE — Assessment & Plan Note (Addendum)
Intractable nausea - Etiology work-up in progress, query gastroenteritis - C. difficile and GI panel PCR ordered and pending collection - Toilet hat in place for stool collection - Discussed with patient at bedside that she will need to provide a sample in order for [place] to make a diagnosis

## 2021-07-28 NOTE — Assessment & Plan Note (Signed)
-   Resumed home morphine 15 mg every 6 hours as needed for moderate and severe pain

## 2021-07-28 NOTE — Assessment & Plan Note (Signed)
-   Supportive measures - LR 1 L x 2 - LR infusion at 125 mL/h, 1 day ordered - Ondansetron 4 mg p.o. every 6 hours as needed for nausea; ondansetron 4 mg IV every 6 6 hours as needed for nausea and vomiting - BMP in the a.m.

## 2021-07-28 NOTE — Assessment & Plan Note (Signed)
-   Resumed home diltiazem 120 mg p.o. daily

## 2021-07-28 NOTE — Assessment & Plan Note (Addendum)
-   Resumed home apixaban 5 mg p.o. twice daily - Resumed home diltiazem 30 mg every 6 hours as needed for heart rate greater than 120

## 2021-07-28 NOTE — Progress Notes (Signed)
  Transition of Care [facility]) Screening Note   Patient Details  Name: [name] Date of Birth: 1960-12-05   Transition of Care [facility]) CM/SW Contact:    Alberteen Sam, LCSW Phone Number: 07/28/2021, 10:46 AM  Patient from home with husband and grandchildren. No noted needs yet.  Transition of Care Department [facility]) has reviewed patient and no TOC needs have been identified at this time. We will continue to monitor patient advancement through interdisciplinary progression rounds. If new patient transition needs arise, please place a TOC consult.  [place], [place]

## 2021-07-28 NOTE — Progress Notes (Signed)
PROGRESS NOTE    [name]  EHO:[id number] DOB: 1961-01-30 DOA: 07/27/2021 PCP: Virginia Crews, MD   Assessment & Plan:   Principal Problem:   Intractable nausea and vomiting Active Problems:   Cervical fusion syndrome   Chronic anticoagulation (Eliquis)   Anxiety state   Clinical depression   Colon, diverticulosis   OSA (obstructive sleep apnea)   Hypertension   Pure hypercholesterolemia   Chronic use of opiate for therapeutic purpose   Atrial fibrillation with RVR (HCC)   Diarrhea  Assessment and Plan: Intractable nausea and vomiting: etiology unclear. Hx of pancreatic insufficiency. Continue on IVFs. Zofran prn. Continue on creon. CT abd/pelvis shows no acute abd or pelvic pathology   Cervical fusion syndrome: continue on home dose of morphine.    Diarrhea: etiology unclear, GI PCR panel & c. diff ordered.    Likely PAF: continue on home dose of diltiazem, eliquis    HTN: continue on home dose of diltiazem    Depression: severity unknown. Continue on home dose of buspirone       DVT prophylaxis: eliquis  Code Status: full  Family Communication:  Disposition Plan: likely d/c back home   Level of care: Progressive  Status is: Observation The patient remains OBS appropriate and will d/c before 2 midnights.   Consultants:    Procedures:   Antimicrobials:    Subjective: Pt c/o nausea, vomiting & diarrhea.  Objective: Vitals:   07/28/21 1200 07/28/21 1230 07/28/21 1330 07/28/21 1428  BP: 112/80 105/80 108/76   Pulse: 90 84 99   Resp: '12 12 18   '$ Temp:    98.8 F (37.1 C)  TempSrc:    Oral  SpO2: 97% 98% 100%   Weight:        Intake/Output Summary (Last 24 hours) at 07/28/2021 1438 Last data filed at 07/27/2021 1741 Gross per 24 hour  Intake 2000 ml  Output --  Net 2000 ml   Filed Weights   07/27/21 1402  Weight: 69.9 kg    Examination:  General exam: Appears calm and comfortable  Respiratory system: Clear to  auscultation. Respiratory effort normal. Cardiovascular system: S1 & S2+. No rubs, gallops or clicks. Gastrointestinal system: Abdomen is nondistended, soft and nontender.  Normal bowel sounds heard. Central nervous system: Alert and oriented. Moves all extremities  Psychiatry: Judgement and insight appear normal. Flat mood and affect     Data Reviewed: I have personally reviewed following labs and imaging studies  CBC: Recent Labs  Lab 07/27/21 1405 07/28/21 0731  WBC 8.5 5.6  HGB 15.9* 12.5  HCT 46.9* 36.8  MCV 86.5 86.4  PLT 467* 370   Basic Metabolic Panel: Recent Labs  Lab 07/27/21 1405 07/28/21 0731  NA 139 141  K 3.7 3.5  CL 104 109  CO2 22 24  GLUCOSE 140* 107*  BUN 12 8  CREATININE 0.95 0.73  CALCIUM 9.8 8.9   GFR: Estimated Creatinine Clearance: 70 mL/min (by C-G formula based on SCr of 0.73 mg/dL). Liver Function Tests: Recent Labs  Lab 07/27/21 1405  AST 19  ALT 11  ALKPHOS 125  BILITOT 0.9  PROT 7.9  ALBUMIN 4.4   Recent Labs  Lab 07/27/21 1405  LIPASE 29   No results for input(s): AMMONIA in the last 168 hours. Coagulation Profile: No results for input(s): INR, PROTIME in the last 168 hours. Cardiac Enzymes: No results for input(s): CKTOTAL, CKMB, CKMBINDEX, TROPONINI in the last 168 hours. BNP (last 3 results) No results  for input(s): PROBNP in the last 8760 hours. HbA1C: No results for input(s): HGBA1C in the last 72 hours. CBG: No results for input(s): GLUCAP in the last 168 hours. Lipid Profile: No results for input(s): CHOL, HDL, LDLCALC, TRIG, CHOLHDL, LDLDIRECT in the last 72 hours. Thyroid Function Tests: No results for input(s): TSH, T4TOTAL, FREET4, T3FREE, THYROIDAB in the last 72 hours. Anemia Panel: No results for input(s): VITAMINB12, FOLATE, FERRITIN, TIBC, IRON, RETICCTPCT in the last 72 hours. Sepsis Labs: No results for input(s): PROCALCITON, LATICACIDVEN in the last 168 hours.  Recent Results (from the past 240  hour(s))  Resp Panel by RT-PCR (Flu A&B, Covid) Nasopharyngeal Swab     Status: None   Collection Time: 07/27/21  8:09 PM   Specimen: Nasopharyngeal Swab; Nasopharyngeal(NP) swabs in vial transport medium  Result Value Ref Range Status   SARS Coronavirus 2 by RT PCR NEGATIVE NEGATIVE Final    Comment: (NOTE) SARS-CoV-2 target nucleic acids are NOT DETECTED.  The SARS-CoV-2 RNA is generally detectable in upper respiratory specimens during the acute phase of infection. The lowest concentration of SARS-CoV-2 viral copies this assay can detect is 138 copies/mL. A negative result does not preclude SARS-Cov-2 infection and should not be used as the sole basis for treatment or other patient management decisions. A negative result may occur with  improper specimen collection/handling, submission of specimen other than nasopharyngeal swab, presence of viral mutation(s) within the areas targeted by this assay, and inadequate number of viral copies(<138 copies/mL). A negative result must be combined with clinical observations, patient history, and epidemiological information. The expected result is Negative.  Fact Sheet for Patients:  EntrepreneurPulse.com.au  Fact Sheet for Healthcare Providers:  IncredibleEmployment.be  This test is no t yet approved or cleared by the [place] FDA and  has been authorized for detection and/or diagnosis of SARS-CoV-2 by FDA under an Emergency Use Authorization (EUA). This EUA will remain  in effect (meaning this test can be used) for the duration of the COVID-19 declaration under Section 564(b)(1) of the Act, 21 U.S.C.section 360bbb-3(b)(1), unless the authorization is terminated  or revoked sooner.       Influenza A by PCR NEGATIVE NEGATIVE Final   Influenza B by PCR NEGATIVE NEGATIVE Final    Comment: (NOTE) The Xpert Xpress SARS-CoV-2/FLU/RSV plus assay is intended as an aid in the diagnosis of influenza from  Nasopharyngeal swab specimens and should not be used as a sole basis for treatment. Nasal washings and aspirates are unacceptable for Xpert Xpress SARS-CoV-2/FLU/RSV testing.  Fact Sheet for Patients: EntrepreneurPulse.com.au  Fact Sheet for Healthcare Providers: IncredibleEmployment.be  This test is not yet approved or cleared by the [place] FDA and has been authorized for detection and/or diagnosis of SARS-CoV-2 by FDA under an Emergency Use Authorization (EUA). This EUA will remain in effect (meaning this test can be used) for the duration of the COVID-19 declaration under Section 564(b)(1) of the Act, 21 U.S.C. section 360bbb-3(b)(1), unless the authorization is terminated or revoked.  Performed at [facility], [address] [place], [place] [address]   Gastrointestinal Panel by PCR , Stool     Status: None   Collection Time: 07/28/21  9:34 AM   Specimen: Stool  Result Value Ref Range Status   Campylobacter species NOT DETECTED NOT DETECTED Final   Plesimonas shigelloides NOT DETECTED NOT DETECTED Final   Salmonella species NOT DETECTED NOT DETECTED Final   Yersinia enterocolitica NOT DETECTED NOT DETECTED Final   Vibrio species NOT  DETECTED NOT DETECTED Final   Vibrio cholerae NOT DETECTED NOT DETECTED Final   Enteroaggregative E coli (EAEC) NOT DETECTED NOT DETECTED Final   Enteropathogenic E coli (EPEC) NOT DETECTED NOT DETECTED Final   Enterotoxigenic E coli (ETEC) NOT DETECTED NOT DETECTED Final   Shiga like toxin producing E coli (STEC) NOT DETECTED NOT DETECTED Final   Shigella/Enteroinvasive E coli (EIEC) NOT DETECTED NOT DETECTED Final   Cryptosporidium NOT DETECTED NOT DETECTED Final   Cyclospora cayetanensis NOT DETECTED NOT DETECTED Final   Entamoeba histolytica NOT DETECTED NOT DETECTED Final   Giardia lamblia NOT DETECTED NOT DETECTED Final   Adenovirus F40/41 NOT DETECTED NOT DETECTED Final   Astrovirus  NOT DETECTED NOT DETECTED Final   Norovirus GI/GII NOT DETECTED NOT DETECTED Final   Rotavirus A NOT DETECTED NOT DETECTED Final   Sapovirus (I, II, IV, and V) NOT DETECTED NOT DETECTED Final    Comment: Performed at [facility], [address] [place], [place] [address]  C Difficile Quick Screen w PCR reflex     Status: None   Collection Time: 07/28/21  9:34 AM   Specimen: Stool  Result Value Ref Range Status   C Diff antigen NEGATIVE NEGATIVE Final   C Diff toxin NEGATIVE NEGATIVE Final   C Diff interpretation No C. difficile detected.  Final    Comment: Performed at [facility], [address] [place], [place] [address]         Radiology Studies: CT ABDOMEN PELVIS WO CONTRAST  Result Date: 07/28/2021 CLINICAL DATA:  Nausea, vomiting, diarrhea EXAM: CT ABDOMEN AND PELVIS WITHOUT CONTRAST TECHNIQUE: Multidetector CT imaging of the abdomen and pelvis was performed following the standard protocol without IV contrast. RADIATION DOSE REDUCTION: This exam was performed according to the departmental dose-optimization program which includes automated exposure control, adjustment of the mA and/or kV according to patient size and/or use of iterative reconstruction technique. COMPARISON:  05/01/2020 FINDINGS: Lower chest: No acute abnormality. Hepatobiliary: No focal liver abnormality is seen. Prior cholecystectomy. Pancreas: Unremarkable. No pancreatic ductal dilatation or surrounding inflammatory changes. Spleen: Normal in size without focal abnormality. Adrenals/Urinary Tract: Adrenal glands are unremarkable. Kidneys are normal, without renal calculi, focal lesion, or hydronephrosis. Bladder is unremarkable. Stomach/Bowel: Stomach is within normal limits. No evidence of bowel wall thickening, distention, or inflammatory changes. Appendix is normal. Vascular/Lymphatic: Normal caliber abdominal aorta with mild atherosclerosis. No lymphadenopathy. Reproductive: Status post  hysterectomy. No adnexal masses. Other: No abdominal wall hernia or abnormality. No abdominopelvic ascites. Musculoskeletal: No acute osseous abnormality. No aggressive osseous lesion. Right total hip arthroplasty. Posterior spinal fusion from T10 through L4. IMPRESSION: 1. No acute abdominal or pelvic pathology. 2. Aortic Atherosclerosis (ICD10-I70.0). Electronically Signed   By: Kathreen Devoid M.D.   On: 07/28/2021 11:28   DG Chest Port 1 View  Result Date: 07/27/2021 CLINICAL DATA:  Nausea and vomiting and atrial fibrillation, initial encounter EXAM: PORTABLE CHEST 1 VIEW COMPARISON:  01/15/2021 FINDINGS: Cardiac shadow is stable. The lungs are well aerated bilaterally. No focal infiltrate or effusion is seen. Postsurgical changes are noted in the cervical and thoracolumbar spine stable from the prior study. No new focal abnormality is noted. IMPRESSION: No active disease. Electronically Signed   By: Inez Catalina M.D.   On: 07/27/2021 20:23        Scheduled Meds:  ALPRAZolam  1 mg Oral QHS   apixaban  5 mg Oral BID   busPIRone  15 mg Oral BID   diltiazem  120 mg Oral Daily   lipase/protease/amylase  72,000 Units Oral TID WC   montelukast  10 mg Oral QHS   pantoprazole  40 mg Oral BID   Continuous Infusions:  lactated ringers 125 mL/hr at 07/28/21 0307     LOS: 0 days    Time spent: 35 mins     Wyvonnia Dusky, MD [facility] Pager [phone]  If 7PM-7AM, please contact night-coverage 07/28/2021, 2:38 PM

## 2021-07-29 DIAGNOSIS — I4891 Unspecified atrial fibrillation: Secondary | ICD-10-CM

## 2021-07-29 DIAGNOSIS — E876 Hypokalemia: Secondary | ICD-10-CM

## 2021-07-29 DIAGNOSIS — Q761 Klippel-Feil syndrome: Secondary | ICD-10-CM

## 2021-07-29 DIAGNOSIS — I10 Essential (primary) hypertension: Secondary | ICD-10-CM | POA: Diagnosis not present

## 2021-07-29 DIAGNOSIS — I480 Paroxysmal atrial fibrillation: Secondary | ICD-10-CM | POA: Diagnosis not present

## 2021-07-29 LAB — BASIC METABOLIC PANEL
Anion gap: 10 (ref 5–15)
BUN: 9 mg/dL (ref 8–23)
CO2: 24 mmol/L (ref 22–32)
Calcium: 9.1 mg/dL (ref 8.9–10.3)
Chloride: 105 mmol/L (ref 98–111)
Creatinine, Ser: 0.8 mg/dL (ref 0.44–1.00)
GFR, Estimated: >60 mL/min (ref >60)
Glucose, Bld: 97 mg/dL (ref 70–99)
Potassium: 3.4 mmol/L — ABNORMAL LOW (ref 3.5–5.1)
Sodium: 139 mmol/L (ref 135–145)

## 2021-07-29 LAB — CBC
HCT: 42.5 % (ref 36.0–46.0)
Hemoglobin: 14.2 g/dL (ref 12.0–15.0)
MCH: 28.9 pg (ref 26.0–34.0)
MCHC: 33.4 g/dL (ref 30.0–36.0)
MCV: 86.6 fL (ref 80.0–100.0)
Platelets: 358 10*3/uL (ref 150–400)
RBC: 4.91 MIL/uL (ref 3.87–5.11)
RDW: 13.4 % (ref 11.5–15.5)
WBC: 6.6 10*3/uL (ref 4.0–10.5)
nRBC: 0 % (ref 0.0–0.2)

## 2021-07-29 LAB — HIV ANTIBODY (ROUTINE TESTING W REFLEX): HIV Screen 4th Generation wRfx: NONREACTIVE

## 2021-07-29 LAB — MAGNESIUM: Magnesium: 2.3 mg/dL (ref 1.7–2.4)

## 2021-07-29 MED ORDER — DILTIAZEM LOAD VIA INFUSION
10.0000 mg | Freq: Once | INTRAVENOUS | Status: AC
  Administered 2021-07-29: 10 mg via INTRAVENOUS
  Filled 2021-07-29: qty 10

## 2021-07-29 MED ORDER — POTASSIUM CHLORIDE CRYS ER 20 MEQ PO TBCR
20.0000 meq | EXTENDED_RELEASE_TABLET | Freq: Once | ORAL | Status: AC
  Administered 2021-07-29: 20 meq via ORAL
  Filled 2021-07-29: qty 1

## 2021-07-29 MED ORDER — DILTIAZEM HCL ER COATED BEADS 180 MG PO CP24: 180.0000 mg | ORAL_CAPSULE | Freq: Every day | ORAL | Status: DC

## 2021-07-29 MED ORDER — DILTIAZEM HCL 30 MG PO TABS
60.0000 mg | ORAL_TABLET | Freq: Once | ORAL | Status: AC
  Administered 2021-07-29: 60 mg via ORAL
  Filled 2021-07-29: qty 2

## 2021-07-29 MED ORDER — DILTIAZEM HCL-DEXTROSE 125-5 MG/125ML-% IV SOLN (PREMIX)
5.0000 mg/h | INTRAVENOUS | Status: DC
  Administered 2021-07-29 – 2021-07-30 (×2): 5 mg/h via INTRAVENOUS
  Filled 2021-07-29 (×2): qty 125

## 2021-07-29 NOTE — Progress Notes (Signed)
PROGRESS NOTE    [name]  YOV:[id number] DOB: 02/09/1961 DOA: 07/27/2021 PCP: Virginia Crews, MD   Assessment & Plan:   Principal Problem:   Intractable nausea and vomiting Active Problems:   Cervical fusion syndrome   Chronic anticoagulation (Eliquis)   Anxiety state   Clinical depression   Colon, diverticulosis   OSA (obstructive sleep apnea)   Hypertension   Pure hypercholesterolemia   Chronic use of opiate for therapeutic purpose   Atrial fibrillation with RVR (HCC)   Diarrhea  Assessment and Plan: Intractable nausea and vomiting: etiology unclear. Zofran prn. Continue on creon as pt has hx of pancreatic insufficiency. CT abd/pelvis shows no acute abd or pelvic pathology. Resolved   Cervical fusion syndrome: continue on home dose of morphine     Diarrhea: etiology unclear, GI PCR panel & c. diff are both neg. Diarrhea resolved    Likely PAF: w/ RVR.  Increased home dose of diltiazem and continue on eliquis. Started on IV dilt drip as per cardio. Cardio recs apprec   HTN:  continue on increased dose of diltiazem    Depression: severity unknown. Continue on home dose of buspirone       DVT prophylaxis: eliquis  Code Status: full  Family Communication:  Disposition Plan: likely d/c back home   Level of care: Progressive  Status is: Observation The patient remains OBS appropriate and will d/c before 2 midnights.   Consultants:    Procedures:   Antimicrobials:    Subjective: Pt c/o palpations   Objective: Vitals:   07/29/21 0151 07/29/21 0445 07/29/21 0446 07/29/21 0748  BP: 108/73 101/73 113/90 125/85  Pulse: 67   (!) 101  Resp: '16 16 20 18  '$ Temp: 97.9 F (36.6 C) 97.6 F (36.4 C)  98 F (36.7 C)  TempSrc: Oral Oral    SpO2: 99% 98%  100%  Weight:        Intake/Output Summary (Last 24 hours) at 07/29/2021 0758 Last data filed at 07/29/2021 0445 Gross per 24 hour  Intake 1786.01 ml  Output 1100 ml  Net 686.01 ml   Filed  Weights   07/27/21 1402  Weight: 69.9 kg    Examination:  General exam: Appears calm but uncomfortable  Respiratory system: clear breath sounds b/l  Cardiovascular system: irregularly irregular. No rubs or clicks  Gastrointestinal system: Abd is soft, NT, ND & normal bowel sounds  Central nervous system: alert and oriented. Moves all extremities  Psychiatry: Judgement and insight appears normal. Flat mood and affect    Data Reviewed: I have personally reviewed following labs and imaging studies  CBC: Recent Labs  Lab 07/27/21 1405 07/28/21 0731  WBC 8.5 5.6  HGB 15.9* 12.5  HCT 46.9* 36.8  MCV 86.5 86.4  PLT 467* 741   Basic Metabolic Panel: Recent Labs  Lab 07/27/21 1405 07/28/21 0731  NA 139 141  K 3.7 3.5  CL 104 109  CO2 22 24  GLUCOSE 140* 107*  BUN 12 8  CREATININE 0.95 0.73  CALCIUM 9.8 8.9   GFR: Estimated Creatinine Clearance: 70 mL/min (by C-G formula based on SCr of 0.73 mg/dL). Liver Function Tests: Recent Labs  Lab 07/27/21 1405  AST 19  ALT 11  ALKPHOS 125  BILITOT 0.9  PROT 7.9  ALBUMIN 4.4   Recent Labs  Lab 07/27/21 1405  LIPASE 29   No results for input(s): AMMONIA in the last 168 hours. Coagulation Profile: No results for input(s): INR, PROTIME in the  last 168 hours. Cardiac Enzymes: No results for input(s): CKTOTAL, CKMB, CKMBINDEX, TROPONINI in the last 168 hours. BNP (last 3 results) No results for input(s): PROBNP in the last 8760 hours. HbA1C: No results for input(s): HGBA1C in the last 72 hours. CBG: No results for input(s): GLUCAP in the last 168 hours. Lipid Profile: No results for input(s): CHOL, HDL, LDLCALC, TRIG, CHOLHDL, LDLDIRECT in the last 72 hours. Thyroid Function Tests: No results for input(s): TSH, T4TOTAL, FREET4, T3FREE, THYROIDAB in the last 72 hours. Anemia Panel: No results for input(s): VITAMINB12, FOLATE, FERRITIN, TIBC, IRON, RETICCTPCT in the last 72 hours. Sepsis Labs: No results for  input(s): PROCALCITON, LATICACIDVEN in the last 168 hours.  Recent Results (from the past 240 hour(s))  Resp Panel by RT-PCR (Flu A&B, Covid) Nasopharyngeal Swab     Status: None   Collection Time: 07/27/21  8:09 PM   Specimen: Nasopharyngeal Swab; Nasopharyngeal(NP) swabs in vial transport medium  Result Value Ref Range Status   SARS Coronavirus 2 by RT PCR NEGATIVE NEGATIVE Final    Comment: (NOTE) SARS-CoV-2 target nucleic acids are NOT DETECTED.  The SARS-CoV-2 RNA is generally detectable in upper respiratory specimens during the acute phase of infection. The lowest concentration of SARS-CoV-2 viral copies this assay can detect is 138 copies/mL. A negative result does not preclude SARS-Cov-2 infection and should not be used as the sole basis for treatment or other patient management decisions. A negative result may occur with  improper specimen collection/handling, submission of specimen other than nasopharyngeal swab, presence of viral mutation(s) within the areas targeted by this assay, and inadequate number of viral copies(<138 copies/mL). A negative result must be combined with clinical observations, patient history, and epidemiological information. The expected result is Negative.  Fact Sheet for Patients:  EntrepreneurPulse.com.au  Fact Sheet for Healthcare Providers:  IncredibleEmployment.be  This test is no t yet approved or cleared by the [place] FDA and  has been authorized for detection and/or diagnosis of SARS-CoV-2 by FDA under an Emergency Use Authorization (EUA). This EUA will remain  in effect (meaning this test can be used) for the duration of the COVID-19 declaration under Section 564(b)(1) of the Act, 21 U.S.C.section 360bbb-3(b)(1), unless the authorization is terminated  or revoked sooner.       Influenza A by PCR NEGATIVE NEGATIVE Final   Influenza B by PCR NEGATIVE NEGATIVE Final    Comment: (NOTE) The  Xpert Xpress SARS-CoV-2/FLU/RSV plus assay is intended as an aid in the diagnosis of influenza from Nasopharyngeal swab specimens and should not be used as a sole basis for treatment. Nasal washings and aspirates are unacceptable for Xpert Xpress SARS-CoV-2/FLU/RSV testing.  Fact Sheet for Patients: EntrepreneurPulse.com.au  Fact Sheet for Healthcare Providers: IncredibleEmployment.be  This test is not yet approved or cleared by the [place] FDA and has been authorized for detection and/or diagnosis of SARS-CoV-2 by FDA under an Emergency Use Authorization (EUA). This EUA will remain in effect (meaning this test can be used) for the duration of the COVID-19 declaration under Section 564(b)(1) of the Act, 21 U.S.C. section 360bbb-3(b)(1), unless the authorization is terminated or revoked.  Performed at [facility], [address] [place], [place] [address]   Gastrointestinal Panel by PCR , Stool     Status: None   Collection Time: 07/28/21  9:34 AM   Specimen: Stool  Result Value Ref Range Status   Campylobacter species NOT DETECTED NOT DETECTED Final   Plesimonas shigelloides NOT DETECTED NOT DETECTED  Final   Salmonella species NOT DETECTED NOT DETECTED Final   Yersinia enterocolitica NOT DETECTED NOT DETECTED Final   Vibrio species NOT DETECTED NOT DETECTED Final   Vibrio cholerae NOT DETECTED NOT DETECTED Final   Enteroaggregative E coli (EAEC) NOT DETECTED NOT DETECTED Final   Enteropathogenic E coli (EPEC) NOT DETECTED NOT DETECTED Final   Enterotoxigenic E coli (ETEC) NOT DETECTED NOT DETECTED Final   Shiga like toxin producing E coli (STEC) NOT DETECTED NOT DETECTED Final   Shigella/Enteroinvasive E coli (EIEC) NOT DETECTED NOT DETECTED Final   Cryptosporidium NOT DETECTED NOT DETECTED Final   Cyclospora cayetanensis NOT DETECTED NOT DETECTED Final   Entamoeba histolytica NOT DETECTED NOT DETECTED Final   Giardia  lamblia NOT DETECTED NOT DETECTED Final   Adenovirus F40/41 NOT DETECTED NOT DETECTED Final   Astrovirus NOT DETECTED NOT DETECTED Final   Norovirus GI/GII NOT DETECTED NOT DETECTED Final   Rotavirus A NOT DETECTED NOT DETECTED Final   Sapovirus (I, II, IV, and V) NOT DETECTED NOT DETECTED Final    Comment: Performed at [facility], [address] [place], [place] [address]  C Difficile Quick Screen w PCR reflex     Status: None   Collection Time: 07/28/21  9:34 AM   Specimen: Stool  Result Value Ref Range Status   C Diff antigen NEGATIVE NEGATIVE Final   C Diff toxin NEGATIVE NEGATIVE Final   C Diff interpretation No C. difficile detected.  Final    Comment: Performed at [facility], [address] [place], [place] [address]  MRSA Next Gen by PCR, Nasal     Status: None   Collection Time: 07/28/21  7:00 PM   Specimen: Nasal Mucosa; Nasal Swab  Result Value Ref Range Status   MRSA by PCR Next Gen NOT DETECTED NOT DETECTED Final    Comment: (NOTE) The GeneXpert MRSA Assay (FDA approved for NASAL specimens only), is one component of a comprehensive MRSA colonization surveillance program. It is not intended to diagnose MRSA infection nor to guide or monitor treatment for MRSA infections. Test performance is not FDA approved in patients less than 55 years old. Performed at [facility], [address] [place], [place] [address]          Radiology Studies: CT ABDOMEN PELVIS WO CONTRAST  Result Date: 07/28/2021 CLINICAL DATA:  Nausea, vomiting, diarrhea EXAM: CT ABDOMEN AND PELVIS WITHOUT CONTRAST TECHNIQUE: Multidetector CT imaging of the abdomen and pelvis was performed following the standard protocol without IV contrast. RADIATION DOSE REDUCTION: This exam was performed according to the departmental dose-optimization program which includes automated exposure control, adjustment of the mA and/or kV according to patient size and/or use of iterative  reconstruction technique. COMPARISON:  05/01/2020 FINDINGS: Lower chest: No acute abnormality. Hepatobiliary: No focal liver abnormality is seen. Prior cholecystectomy. Pancreas: Unremarkable. No pancreatic ductal dilatation or surrounding inflammatory changes. Spleen: Normal in size without focal abnormality. Adrenals/Urinary Tract: Adrenal glands are unremarkable. Kidneys are normal, without renal calculi, focal lesion, or hydronephrosis. Bladder is unremarkable. Stomach/Bowel: Stomach is within normal limits. No evidence of bowel wall thickening, distention, or inflammatory changes. Appendix is normal. Vascular/Lymphatic: Normal caliber abdominal aorta with mild atherosclerosis. No lymphadenopathy. Reproductive: Status post hysterectomy. No adnexal masses. Other: No abdominal wall hernia or abnormality. No abdominopelvic ascites. Musculoskeletal: No acute osseous abnormality. No aggressive osseous lesion. Right total hip arthroplasty. Posterior spinal fusion from T10 through L4. IMPRESSION: 1. No acute abdominal or pelvic pathology. 2. Aortic Atherosclerosis (  ICD10-I70.0). Electronically Signed   By: Kathreen Devoid M.D.   On: 07/28/2021 11:28   DG Chest Port 1 View  Result Date: 07/27/2021 CLINICAL DATA:  Nausea and vomiting and atrial fibrillation, initial encounter EXAM: PORTABLE CHEST 1 VIEW COMPARISON:  01/15/2021 FINDINGS: Cardiac shadow is stable. The lungs are well aerated bilaterally. No focal infiltrate or effusion is seen. Postsurgical changes are noted in the cervical and thoracolumbar spine stable from the prior study. No new focal abnormality is noted. IMPRESSION: No active disease. Electronically Signed   By: Inez Catalina M.D.   On: 07/27/2021 20:23        Scheduled Meds:  ALPRAZolam  1 mg Oral QHS   apixaban  5 mg Oral BID   busPIRone  15 mg Oral BID   diltiazem  120 mg Oral Daily   lipase/protease/amylase  72,000 Units Oral TID WC   montelukast  10 mg Oral QHS   pantoprazole  40 mg  Oral BID   Continuous Infusions:     LOS: 0 days    Time spent: 36 mins     Wyvonnia Dusky, MD [facility] Pager [phone]  If 7PM-7AM, please contact night-coverage 07/29/2021, 7:58 AM

## 2021-07-29 NOTE — Progress Notes (Signed)
HR 100s-110s afib. Pt reports feeling fluttering and tightness in her chest and has felt like this since Monday when she thinks her afib started. Reports history of afib off and on and it usually feels like this. Says she takes PRN diltiazem at home when HR >104. Current order for PRN diltiazem if HR >120. NP notified and order changed to give for HR >104.  After further discussion with pt, decided to to wait on PRN diltiazem since giving xanax and morphine now.  Reassessed after xanax and morphine and HR 90s to low 100s, afib. Pt reports tightness has resolved and declined PRN diltiazem.    07/28/21 2136  Assess: MEWS Score  BP 119/84  ECG Heart Rate (!) 113  Resp 18  Level of Consciousness Alert  SpO2 93 %  O2 Device Room Air  Assess: MEWS Score  MEWS Temp 0  MEWS Systolic 0  MEWS Pulse 2  MEWS RR 0  MEWS LOC 0  MEWS Score 2  MEWS Score Color Yellow  Assess: if the MEWS score is Yellow or Red  Were vital signs taken at a resting state? Yes  Focused Assessment No change from prior assessment  Does the patient meet 2 or more of the SIRS criteria? No  MEWS guidelines implemented *See Row Information* Yes  Treat  MEWS Interventions Administered scheduled meds/treatments (scheduled xanax and morphine given)  Take Vital Signs  Increase Vital Sign Frequency  Yellow: Q 2hr X 2 then Q 4hr X 2, if remains yellow, continue Q 4hrs  Escalate  MEWS: Escalate Yellow: discuss with charge nurse/RN and consider discussing with provider and RRT  Notify: Charge Nurse/RN  Name of Charge Nurse/RN Notified Holley Dexter RN  Date Charge Nurse/RN Notified 07/29/21  Time Charge Nurse/RN Notified 0155  Notify: Provider  Provider Name/Title B. Randol Kern, NP  Date Provider Notified 07/28/21  Time Provider Notified 2142  Method of Notification  (secure chat)  Notification Reason Requested by patient/family (afib HR 100-110s. has prn PO diltiazem if HR >120. she feels tightness and fluttering in her chest  (has since Monday) and would like a PRN diltiazem now)  Provider response See new orders  Date of Provider Response 07/28/21  Time of Provider Response 2144  Document  Patient Outcome Stabilized after interventions  Progress note created (see row info) Yes  Assess: SIRS CRITERIA  SIRS Temperature  0  SIRS Pulse 1  SIRS Respirations  0  SIRS WBC 0  SIRS Score Sum  1

## 2021-07-29 NOTE — Progress Notes (Signed)
   07/29/21 1954  Assess: MEWS Score  Temp 98.4 F (36.9 C)  BP 98/86  Pulse Rate (!) 104  Resp 20  Level of Consciousness Alert  SpO2 94 %  O2 Device Room Air  Assess: MEWS Score  MEWS Temp 0  MEWS Systolic 1  MEWS Pulse 1  MEWS RR 0  MEWS LOC 0  MEWS Score 2  MEWS Score Color Yellow  Assess: if the MEWS score is Yellow or Red  Were vital signs taken at a resting state? No  Focused Assessment No change from prior assessment  Does the patient meet 2 or more of the SIRS criteria? No  MEWS guidelines implemented *See Row Information* Yes  Treat  MEWS Interventions Other (Comment) (none)  Pain Scale 0-10  Pain Score 0  Take Vital Signs  Increase Vital Sign Frequency   (retook vitals within an hour)  Escalate  MEWS: Escalate  (Vital signs returned to normal)  Notify: Charge Nurse/RN  Name of Charge Nurse/RN Notified Felicia, RN  Date Charge Nurse/RN Notified 07/29/21  Time Charge Nurse/RN Notified 2030  Document  Patient Outcome Other (Comment) (VS returned to [facility])  Assess: SIRS CRITERIA  SIRS Temperature  0  SIRS Pulse 1  SIRS Respirations  0  SIRS WBC 0  SIRS Score Sum  1

## 2021-07-29 NOTE — Consult Note (Signed)
Cardiology Consultation:   Patient ID: TAMURA LASKY MRN: [id number]; DOB: 1961/03/07  Admit date: 07/27/2021 Date of Consult: 07/29/2021  PCP:  Virginia Crews, MD   [place] Providers Cardiologist:  Kate Sable, MD  Electrophysiologist:  Vickie Epley, MD  {   Patient Profile:   [name] is a 61 y.o. female with a hx of afib/flutter s/p ablation 08/06/20 on Eliquis, HTN, asthma, anxiety, GERD [name] is being seen 07/29/2021 for the evaluation of Afib RVR at the request of Dr. Faythe Ghee.  History of Present Illness:   [name] is followed by Dr. Garen Lah and Dr. Quentin Ore for the above cardiac issues.  Patient underwent diagnostic catheterization in March 2021 which did not show any significant CAD.  Echo in April 2021 showed normal LV function.  Heart monitor showed A-fib with a 30% burden and she was placed on a beta-blocker and Eliquis.  She subsequently required cardioversion and amiodarone therapy was started in September 2021.  Follow-up heart monitor in March 2022, showed short runs of SVT and A-fib, and bradycardia into the 30s.  She was referred to EP, who felt minimal exposure to amiodarone was appropriate, might be playing a role to bradycardia.  She was referred to Dr. Quentin Ore for consideration of catheter ablation and subsequently underwent successful catheter ablation in May 2022.  In follow-up patient remained in sinus rhythm.  Amiodarone was subsequently stopped. Historically, patient is symptomatic in Afib.   Patient was last seen 03/30/2021 by Dr. Quentin Ore and was overall doing well.  A heart monitor was ordered to assess A-fib burden.  Heart monitor showed 2% A-fib burden.  The patient presented to the ER at [facility] 07/27/2021 for nausea, vomiting, diarrhea.  The patient reported diarrhea for the last few days.  She reported she had intermittent bursts of A-fib and resolved with diltiazem 30 mg.  On the third day she went into A-fib and was unable  to get out of A-fib with short acting Dilt.  When patient is in A-fib she feels lightheaded, dizzy, chest tightness shortness of breath, symptoms are worse with exertion.  She denies missing doses of Eliquis.  In the ER blood pressure 135/91.heart rate was initially up to the 130s.  EKG showed rapid A-fib.  Labs showed sodium 139, potassium 3.7, bicarb 22, creatinine 0.95, BUN 12, WBC 8.5, hemoglobin 15.9, platelets 467.HS trop 9>9.  Patient was given IV fluids and IV Dilt drip '10mg'$  followed by Dilt 50 mg p.o. the patient was admitted for further work-up.     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    Chronic headaches    Chronic, continuous use of opioids    DDD (degenerative disc disease)    CERVICAL AND LUMBAR-CHRONIC PAIN, RT HIP LABRAL TEAR   Depression    PT STATES A LOT OF STRESS IN HER LIFE   Dissociative disorder    Dizziness 04/22/2013   Duodenal ulcer with hemorrhage and perforation ([place]) 04/27/2003   Foot drop, right    FROM BACK SURGERY   GERD (gastroesophageal  reflux disease)    H/O arthrodesis 08/18/2014   Headache(784.0)    AND NECK PAIN--STATES RECENT TEST SHOW CERVICAL DEGENERATION   History of blood transfusion    s/p back surgery   History of cardiac cath    a. 05/2019 Cath: Nl cors. EF 55-65%.   History of cardioversion    History of cervical spinal surgery 01/04/2015   History of kidney stones    Hypertension    Inverted T wave    Iron deficiency 02/01/2021   Mitral regurgitation    a. 07/2020 Echo: EF 60-65%, no rwma. Nl RV size/fxn. RVSP 39.33mHg. Mildly to mod dil LA. Mod MR; b. 07/2020  TEE: EF 55-60%. Lambl's excresence. Nl RV size/fxn. Midly dil LA. No LA/LAA thrombus. Mild MR.   Narrowing of intervertebral disc space 08/18/2014   Currently on disability.    Orthostatic hypotension 04/22/2013   Pain    CHRONIC NECK AND BACK PAIN - LIMITED ROM NECK - S/P FUSIONS CERVICAL AND LUMBAR   [place]   a. 07/2020 s/p PVI.   Pneumonia    PONV (postoperative nausea and vomiting)    PT GIVES HX OF N&V AND FEVER WITH SURGERIES YEARS AGO--BUT NO PROBLEMS WITH MORE RECENT SURGERIES--STATES NOT MALIGNANT HYPERTHERMIA   Postop Hyponatremia 05/14/2012   Postoperative anemia due to acute blood loss 05/14/2012   PTSD (post-traumatic stress disorder)    Right foot drop    Right hip arthralgia 08/18/2014   Status post surgery of right, and now needs left.    Sleep apnea 2021   does not have a cpap   Therapeutic opioid-induced constipation (OIC)    Typical atrial flutter ([facility]     Past Surgical History:  Procedure Laterality Date   ABDOMINAL HYSTERECTOMY     afib  05/2019   ANTERIOR CERVICAL DECOMP/DISCECTOMY FUSION N/A 10/30/2012   Procedure: ACDF C5-6, EXPLORATION AND HARDWARE REMOVAL C6-7;  Surgeon: DMelina Schools MD;  [place]  Service: Orthopedics;  Laterality: N/A;   [place]IOR [place]   Procedure: ATRIAL FIBRILLATION ABLATION;  Surgeon: LVickie Epley MD;  [place];  Service: Cardiovascular;  Laterality: N/A;   BACK SURGERY  2009   LUMBAR FUSION WITH RODS    BREAST BIOPSY Right 2008   benign.- bx/clip   BREAST EXCISIONAL BIOPSY Left 1998   benign   BREAST REDUCTION SURGERY Bilateral 06/2016   CARDIAC ELECTROPHYSIOLOGY MAPPING AND ABLATION     CARDIOVERSION N/A 12/03/2019   Procedure: CARDIOVERSION;  Surgeon: AKate Sable MD;  Location: [facility];  Service: Cardiovascular;  Laterality: N/A;   CARPAL TUNNEL RELEASE  05-06-12    Right   CHOLECYSTECTOMY     COLONOSCOPY WITH PROPOFOL N/A 01/26/2017   Procedure: COLONOSCOPY WITH PROPOFOL;  Surgeon: WLucilla Lame MD;  [place]  Service: Endoscopy;  Laterality: N/A;   COLONOSCOPY WITH PROPOFOL N/A 01/16/2021   Procedure: COLONOSCOPY WITH PROPOFOL;  Surgeon: VLin Landsman MD;  [facility];  Service: Gastroenterology;  Laterality: N/A;   DIAGNOSTIC LAPAROSCOPIES - MULTIPLE FOR ENDOMETRIOSIS     ESOPHAGEAL DILATION N/A 01/26/2017   Procedure: ESOPHAGEAL DILATION;  Surgeon: WLucilla Lame MD;  [place]  Service: Endoscopy;  Laterality: N/A;   ESOPHAGEAL MANOMETRY N/A 05/30/2017   Procedure: ESOPHAGEAL MANOMETRY (EM);  Surgeon: WLucilla Lame MD;  Location: [facility];  Service: Endoscopy;  Laterality: N/A;  ESOPHAGOGASTRODUODENOSCOPY (EGD) WITH PROPOFOL N/A 01/26/2017   Procedure: ESOPHAGOGASTRODUODENOSCOPY (EGD) WITH PROPOFOL;  Surgeon: Lucilla Lame, MD;  Location: [place];  Service: Endoscopy;  Laterality: N/A;   ESOPHAGOGASTRODUODENOSCOPY (EGD) WITH PROPOFOL N/A 01/30/2020   Procedure: ESOPHAGOGASTRODUODENOSCOPY (EGD) WITH PROPOFOL;  Surgeon: Lucilla Lame, MD;  Location: [place];  Service: Endoscopy;  Laterality: N/A;  sleep apnea COVID + 01-15-20   ESOPHAGOGASTRODUODENOSCOPY (EGD) WITH PROPOFOL N/A 01/15/2021   Procedure: ESOPHAGOGASTRODUODENOSCOPY (EGD) WITH PROPOFOL;  Surgeon: Lin Landsman, MD;  Location: [place];  Service: Gastroenterology;  Laterality: N/A;   HIP ARTHROSCOPY  09/20/2011   Procedure: ARTHROSCOPY HIP;  Surgeon: Gearlean Alf, MD;  [facility];  Service: Orthopedics;  Laterality: Right;  Right Hip Scope with Labral Debridement   LEFT HEART CATH AND CORONARY ANGIOGRAPHY Left 06/10/2019   Procedure: LEFT HEART CATH AND CORONARY ANGIOGRAPHY;  Surgeon: Nelva Bush, MD;  Location: [place] CV LAB;  Service: Cardiovascular;  Laterality: Left;    NASAL SEPTUM SURGERY  MARCH 2013   IN [place]   [facility] IMPEDANCE STUDY N/A 05/30/2017   Procedure: [place] IMPEDANCE STUDY;  Surgeon: Lucilla Lame, MD;  Location: [facility];  Service: Endoscopy;  Laterality: N/A;   POLYPECTOMY N/A 01/26/2017   Procedure: POLYPECTOMY;  Surgeon: Lucilla Lame, MD;  Location: [place];  Service: Endoscopy;  Laterality: N/A;   POSTERIOR CERVICAL FUSION/FORAMINOTOMY N/A 04/16/2013   Procedure: REMOVAL CERVICAL PLATES AND INTERBODY CAGE/POSTERIOR CERVICAL SPINAL FUSION C4 - C6/C5 CORPECTOMY/C4 - C6 FUSION WITH ILIAC CREST BONE GRAFT;  Surgeon: Melina Schools, MD;  Location: [place];  Service: Orthopedics;  Laterality: N/A;   RADIOFREQUENCY ABLATION NERVES     REDUCTION MAMMAPLASTY Bilateral 05/2016   RIGHT HIP ARTHROSCOPY FOR LABRAL TEAR  ABOUT 2010   2012 also   SHOULDER ARTHROSCOPY  05-06-12   bone spur   TEE WITHOUT CARDIOVERSION N/A 08/05/2020   Procedure: TRANSESOPHAGEAL ECHOCARDIOGRAM (TEE);  Surgeon: Lelon Perla, MD;  Location: [facility];  Service: Cardiovascular;  Laterality: N/A;   TONSILLECTOMY     AS A CHILD   TOTAL HIP ARTHROPLASTY Right 05/13/2012   Procedure: TOTAL HIP ARTHROPLASTY ANTERIOR APPROACH;  Surgeon: Gearlean Alf, MD;  [facility];  Service: Orthopedics;  Laterality: Right;     Home Medications:  Prior to Admission medications   Medication Sig Start Date End Date Taking? Authorizing Provider  ALPRAZolam Duanne Moron) 1 MG tablet Take 1 mg by mouth at bedtime.  07/04/19  Yes [provider]  apixaban (ELIQUIS) 5 MG TABS tablet Take 1 tablet (5 mg total) by mouth 2 (two) times daily. 04/12/21  Yes Agbor-Etang, Aaron Edelman, MD  busPIRone (BUSPAR) 15 MG tablet Take 15 mg by mouth 2 (two) times daily. 07/20/21  Yes [provider]  CREON 36000-114000 units CPEP capsule Take 2 capsules with the first bite of each meal and 1 capsule with the first bite of each snack 01/27/21  Yes Vanga, Tally Due, MD  diltiazem (CARDIZEM  CD) 120 MG 24 hr capsule Take 1 capsule (120 mg total) by mouth daily. 06/27/21  Yes Agbor-Etang, Aaron Edelman, MD  diltiazem (CARDIZEM) 30 MG tablet Take 1 tablet (30 mg total) by mouth every 6 (six) hours as needed. 07/21/21  Yes Agbor-Etang, Aaron Edelman, MD  linaclotide (LINZESS) 145 MCG CAPS capsule TAKE 1 CAPSULE BY MOUTH DAILY BEFORE BREAKFAST. Patient taking differently: Take 145 mcg by mouth daily as needed. 06/03/21  Yes Lucilla Lame, MD  Menthol, Topical Analgesic, (BENGAY EX) Apply 1 application  topically daily as needed (pain).   Yes [provider]  montelukast (SINGULAIR) 10 MG tablet TAKE 1 TABLET BY MOUTH EVERY DAY 03/08/21  Yes Tally Joe T, FNP  morphine (MSIR) 15 MG tablet Take 1 tablet (15 mg total) by mouth every 6 (six) hours as needed for moderate pain or severe pain. Must last 30 days. 07/22/21 08/21/21 Yes Milinda Pointer, MD  naphazoline-pheniramine (VISINE) 0.025-0.3 % ophthalmic solution 1 drop 4 (four) times daily as needed for eye irritation.   Yes [provider]  pantoprazole (PROTONIX) 40 MG tablet TAKE 1 TABLET BY MOUTH TWICE A DAY 06/03/21  Yes Wohl, Darren, MD  polyethylene glycol (MIRALAX / GLYCOLAX) 17 g packet Take 17 g by mouth daily as needed.   Yes [provider]  sodium chloride (OCEAN) 0.65 % SOLN nasal spray Place 1 spray into both nostrils daily as needed for congestion ([place]ry[place]).   Yes [provider]  valACYclovir (VALTREX) 1000 MG tablet TAKE TWO TABLETS BY MOUTH TWICE DAILY FOR ONE DAY FEVER BLISTER 06/13/21  Yes Bacigalupo, Dionne Bucy, MD  cyclobenzaprine (FLEXERIL) 10 MG tablet Take 10 mg by mouth 3 (three) times daily. Patient not taking: Reported on 07/27/2021 06/18/21   [provider]  dicyclomine (BENTYL) 20 MG tablet  02/16/21   [provider]  morphine (MSIR) 15 MG tablet Take 1 tablet (15 mg total) by mouth every 6 (six) hours as needed for moderate pain or severe pain. Must last 30 days. 06/22/21 07/22/21   Milinda Pointer, MD  morphine (MSIR) 15 MG tablet Take 1 tablet (15 mg total) by mouth every 6 (six) hours as needed for moderate pain or severe pain. Must last 30 days. Patient not taking: Reported on 07/14/2021 08/21/21 09/20/21  Milinda Pointer, MD    Inpatient Medications: Scheduled Meds:  ALPRAZolam  1 mg Oral QHS   apixaban  5 mg Oral BID   busPIRone  15 mg Oral BID   [START ON 07/30/2021] diltiazem  180 mg Oral Daily   diltiazem  60 mg Oral Once   lipase/protease/amylase  72,000 Units Oral TID WC   montelukast  10 mg Oral QHS   pantoprazole  40 mg Oral BID   Continuous Infusions:  PRN Meds: acetaminophen **OR** acetaminophen, diltiazem, lipase/protease/amylase **AND** lipase/protease/amylase, morphine, ondansetron **OR** ondansetron (ZOFRAN) IV, sodium chloride  Allergies:    Allergies  Allergen Reactions   Amoxicillin Hives    She did ok w ANCEF   Chlorhexidine Gluconate Dermatitis and Hives   Clindamycin Hives   Codeine Hives   Erythromycin Hives    "mycins" in general   Penicillin G Hives    "cillins" in general   Sulfa Antibiotics Nausea And Vomiting and Hives        Levofloxacin Hives   Shellfish Allergy Hives   Decadron [Dexamethasone] Other (See Comments)    Hot flashes, insomnia, "manic" Hot flashes, insomnia, "manic"   Mangifera Indica Hives    papaya   Papaya Derivatives Hives   Betadine [Povidone Iodine] Hives        Clarithromycin Hives   Other Hives     Mango    Povidone-Iodine Hives   Prednisone Anxiety    High blood pressure, flushed, mood changes, heart palpitations High blood pressure, flushed, mood changes, heart palpitations    Social History:   Social History   Socioeconomic History   Marital status: Married    Spouse name: bruce   Number of children: 2   Years of education:  Not on file   Highest education level: Associate degree: occupational, technical, or vocational program  Occupational History    Comment: disabled   Tobacco Use   Smoking status: Never   Smokeless tobacco: Never  Vaping Use   Vaping Use: Never used  Substance and Sexual Activity   Alcohol use: No    Alcohol/week: 0.0 standard drinks   Drug use: Yes    Comment: prescribed morphine and xanax   Sexual activity: Yes  Other Topics Concern   Not on file  Social History Narrative   Son, daughter  And 4 grandkids; raises 3 of the grandchildren      Lives in [place]; with husband; never smoked; no alcohol; 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    Social Determinants of Health   Financial Resource Strain: Low Risk    Difficulty of Paying Living Expenses: Not hard at all  Food Insecurity: No Food Insecurity   Worried About Charity fundraiser in the Last Year: Never true   [place] in the Last Year: Never true  Transportation Needs: No Transportation Needs   Lack of Transportation (Medical): No   Lack of Transportation (Non-Medical): No  Physical Activity: Insufficiently Active   Days of Exercise per Week: 2 days   Minutes of Exercise per Session: 20 min  Stress: No Stress Concern Present   Feeling of Stress : Only a little  Social Connections: Moderately Integrated   Frequency of Communication with Friends and Family: More than three times a week   Frequency of Social Gatherings with Friends and Family: Never   Attends Religious Services: 1 to 4 times per year   Active Member of [employer] or Organizations: No   Attends Music therapist: Never   Marital Status: Married  Human resources officer Violence: Not At Risk   Fear of Current or Ex-Partner: No   Emotionally Abused: No   Physically Abused: No   Sexually Abused: No    Family History:    Family History  Problem Relation Age of Onset   Aneurysm Mother    Bipolar disorder Sister    Aneurysm Maternal Grandmother    Breast cancer Paternal Grandmother 81   Bipolar disorder Grandchild    Anxiety disorder Grandchild    Depression Grandchild    Cancer Neg Hx      ROS:   Please see the history of present illness.   All other ROS reviewed and negative.     Physical Exam/Data:   Vitals:   07/29/21 0151 07/29/21 0445 07/29/21 0446 07/29/21 0748  BP: 108/73 101/73 113/90 125/85  Pulse: 67   (!) 101  Resp: '16 16 20 18  '$ Temp: 97.9 F (36.6 C) 97.6 F (36.4 C)  98 F (36.7 C)  TempSrc: Oral Oral    SpO2: 99% 98%  100%  Weight:        Intake/Output Summary (Last 24 hours) at 07/29/2021 1019 Last data filed at 07/29/2021 0445 Gross per 24 hour  Intake 1786.01 ml  Output 1100 ml  Net 686.01 ml      07/27/2021    2:02 PM 07/14/2021    1:30 PM 06/13/2021    9:58 AM  Last 3 Weights  Weight (lbs) 154 lb 154 lb 2 oz 150 lb  Weight (kg) 69.854 kg 69.911 kg 68.04 kg     Body mass index is 26.43 kg/m.  General:  Well nourished, well developed, in no acute distress HEENT: normal Neck: no JVD Vascular: No  carotid bruits; Distal pulses 2+ bilaterally Cardiac:  normal S1, S2; Irreg Ireg; no murmur  Lungs:  clear to auscultation bilaterally, no wheezing, rhonchi or rales  Abd: soft, nontender, no hepatomegaly  Ext: no edema Musculoskeletal:  No deformities, BUE and BLE strength normal and equal Skin: warm and dry  Neuro:  CNs 2-12 intact, no focal abnormalities noted Psych:  Normal affect   EKG:  The EKG was personally reviewed and demonstrates:  Afib , 144bpm, possible rate related changes Telemetry:  Telemetry was personally reviewed and demonstrates:  Afib HR 100-120s, elevation to 140-160s at times  Relevant CV Studies:  Echo TEE 08/05/20 PROCEDURE: After discussion of the risks and benefits of a TEE, an  informed consent was obtained from the patient. The transesophogeal probe  was passed without difficulty through the esophogus of the patient. Imaged  were obtained with the patient in a  left lateral decubitus position. Local oropharyngeal anesthetic was  provided with Cetacaine. Sedation performed by different physician. The  patient was  monitored while under deep sedation. Anesthestetic sedation  was provided intravenously by  Anesthesiology: '230mg'$  of Propofol. The patient developed no complications  during the procedure.   IMPRESSIONS    1. 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   2. Left ventricular ejection fraction, by estimation, is 55 to 60%. The  left ventricle has normal function.   3. Right ventricular systolic function is normal. The right ventricular  size is normal.   4. Left atrial size was mildly dilated. No left atrial/left atrial  appendage thrombus was detected.   5. The mitral valve is normal in structure. Mild mitral valve  regurgitation.   6. The aortic valve is tricuspid. Aortic valve regurgitation is trivial.   7. There is mild (Grade II) plaque involving the descending aorta.   Echo 07/22/20  1. Left ventricular ejection fraction, by estimation, is 60 to 65%. The  left ventricle has normal function. The left ventricle has no regional  wall motion abnormalities. Left ventricular diastolic parameters are  indeterminate.   2. Right ventricular systolic function is normal. The right ventricular  size is normal. There is mildly elevated pulmonary artery systolic  pressure. The estimated right ventricular systolic pressure is 16.1 mmHg.   3. Left atrial size was mild to moderately dilated.   4. Unable to exclude mild mitral valve prolapse of the posterior leaflet.  Moderate mitral valve regurgitation. No evidence of mitral stenosis.   LHC 05/2019 Conclusion  Conclusions: No angiographically significant coronary artery disease. Normal left ventricular contraction with mildly elevated filling pressure, suggestive of diastolic heart failure.   Recommendations: Primary prevention of coronary artery disease. Initiate furosemide 40 mg p.o. every other day, as ordered by Dr. Caryl Comes earlier today. If no evidence of bleeding or vascular injury at right radial  catheterization site, apixaban can be restarted tomorrow. Follow-up with Dr. Garen Lah or APP in ~2 weeks.   Nelva Bush, MD [facility]  Laboratory Data:  High Sensitivity Troponin:   Recent Labs  Lab 07/27/21 1405 07/27/21 1741  TROPONINIHS 9 9     Chemistry Recent Labs  Lab 07/27/21 1405 07/28/21 0731 07/29/21 0847  NA 139 141 139  K 3.7 3.5 3.4*  CL 104 109 105  CO2 '22 24 24  '$ GLUCOSE 140* 107* 97  BUN '12 8 9  '$ CREATININE 0.95 0.73 0.80  CALCIUM 9.8 8.9 9.1  GFRNONAA >60 >60 >60  ANIONGAP '13 8 10    '$ Recent Labs  Lab 07/27/21 1405  PROT 7.9  ALBUMIN 4.4  AST 19  ALT 11  ALKPHOS 125  BILITOT 0.9   Lipids No results for input(s): CHOL, TRIG, HDL, LABVLDL, LDLCALC, CHOLHDL in the last 168 hours.  Hematology Recent Labs  Lab 07/27/21 1405 07/28/21 0731 07/29/21 0847  WBC 8.5 5.6 6.6  RBC 5.42* 4.26 4.91  HGB 15.9* 12.5 14.2  HCT 46.9* 36.8 42.5  MCV 86.5 86.4 86.6  MCH 29.3 29.3 28.9  MCHC 33.9 34.0 33.4  RDW 13.2 13.2 13.4  PLT 467* 328 358   Thyroid No results for input(s): TSH, FREET4 in the last 168 hours.  BNPNo results for input(s): BNP, PROBNP in the last 168 hours.  DDimer No results for input(s): DDIMER in the last 168 hours.   Radiology/Studies:  CT ABDOMEN PELVIS WO CONTRAST  Result Date: 07/28/2021 CLINICAL DATA:  Nausea, vomiting, diarrhea EXAM: CT ABDOMEN AND PELVIS WITHOUT CONTRAST TECHNIQUE: Multidetector CT imaging of the abdomen and pelvis was performed following the standard protocol without IV contrast. RADIATION DOSE REDUCTION: This exam was performed according to the departmental dose-optimization program which includes automated exposure control, adjustment of the mA and/or kV according to patient size and/or use of iterative reconstruction technique. COMPARISON:  05/01/2020 FINDINGS: Lower chest: No acute abnormality. Hepatobiliary: No focal liver abnormality is seen. Prior cholecystectomy. Pancreas: Unremarkable. No  pancreatic ductal dilatation or surrounding inflammatory changes. Spleen: Normal in size without focal abnormality. Adrenals/Urinary Tract: Adrenal glands are unremarkable. Kidneys are normal, without renal calculi, focal lesion, or hydronephrosis. Bladder is unremarkable. Stomach/Bowel: Stomach is within normal limits. No evidence of bowel wall thickening, distention, or inflammatory changes. Appendix is normal. Vascular/Lymphatic: Normal caliber abdominal aorta with mild atherosclerosis. No lymphadenopathy. Reproductive: Status post hysterectomy. No adnexal masses. Other: No abdominal wall hernia or abnormality. No abdominopelvic ascites. Musculoskeletal: No acute osseous abnormality. No aggressive osseous lesion. Right total hip arthroplasty. Posterior spinal fusion from T10 through L4. IMPRESSION: 1. No acute abdominal or pelvic pathology. 2. Aortic Atherosclerosis (ICD10-I70.0). Electronically Signed   By: Kathreen Devoid M.D.   On: 07/28/2021 11:28   DG Chest Port 1 View  Result Date: 07/27/2021 CLINICAL DATA:  Nausea and vomiting and atrial fibrillation, initial encounter EXAM: PORTABLE CHEST 1 VIEW COMPARISON:  01/15/2021 FINDINGS: Cardiac shadow is stable. The lungs are well aerated bilaterally. No focal infiltrate or effusion is seen. Postsurgical changes are noted in the cervical and thoracolumbar spine stable from the prior study. No new focal abnormality is noted. IMPRESSION: No active disease. Electronically Signed   By: Inez Catalina M.D.   On: 07/27/2021 20:23     Assessment and Plan:   Afib RVR H/o persistent Afib - h/o afib/flutter with failed cardioversion and prior ablation 07/2020 followed by EP. H/o sinus bradycardia on amiodarone. Historically very symptomatic in afib. - Afib RVR rates up to the 160s in the setting of severe N/V/D  - In the ER she was given IV dilt blus '10mg'$ /hr and dilt '50mg'$  PO with mild improvement of rates - PTA dilt '120mg'$  daily>> dilt increased up to '180mg'$  - she  remains in Afib with relatively controlled rates, patient feels very symptomatic on exertion - denies missing Eliquis doses - patient denies further N/V/D - will try IV dilt load + infusion now that acute symptoms have resolved. If she doesn't convert can consider sending patient home in rate controlled afib with close follow-up with EP. Can also consider cardioversion as she has not missed doses of Eliquis. Md to  see  Nausea/vomiting/diarrhea - resolved - tx per IM  For questions or updates, please contact [facility] Please consult [email or web address] for contact info under    Signed, Donia Yokum Ninfa Meeker, PA-C  07/29/2021 10:19 AM

## 2021-07-29 NOTE — Progress Notes (Signed)
OT Cancellation Note  Patient Details Name: [name] MRN: [id number] DOB: November 12, 1960   Cancelled Treatment:    Reason Eval/Treat Not Completed: OT screened, no needs identified, will sign off. Order received, chart reviewed. Pt back to baseline functional independence. No skilled OT needs identified. Will sign off. Please re-consult if additional needs arise.   Dessie Coma, M.S. OTR/L  07/29/21, 8:38 AM  ascom [phone]

## 2021-07-29 NOTE — Evaluation (Signed)
Physical Therapy Evaluation Patient Details Name: [name] MRN: [id number] DOB: November 12, 1960 Today's Date: 07/29/2021  History of Present Illness  Patient is a 61 year old female PMH(+) for paroxysmal atrial fibrillation and atrial flutter,depression, anxiety, muscle spasms, GERD, hypertension, who presented to the [facility] ED for chief concerns of intractable nausea and vomiting and diarrhea for 6 days.   Clinical Impression  Physical Therapy evaluation completed this date. Patient tolerated session well and was agreeable to treatment. No pain reported throughout session, however increased lightheadedness reported during activity due to patient's A-Fib. Patient states she lives in a 2 story home with her husband, son, and 3 grandkids. There are 5 STE with B hand rails, and patient reports she would intermittently use a SPC (when her hips "are acting up") at baseline. Patient states she was Mod I to Independent prior to hospitalization. Patient demonstrated [facility] strength and ROM. Was able to independently donn B socks, and completed all bed mobility and sit to stands at independent/Mod I. Patient ambulated within the room at Mod I with no LOB noted and no AD. Increased guarded posture noted during ambulation, however patient reported that "once I get this A-Fib under control I will be fine". HR ranged from 80-90bpm in supine, however ranged from 124-145bpm with functional activity. Patient was left in bed with all needs met and in reach. Patient is demonstrating at/near baseline level of function, and does not require addition skilled physical therapy. There there is a change in status please reach out. Signing off.      Recommendations for follow up therapy are one component of a multi-disciplinary discharge planning process, led by the attending physician.  Recommendations may be updated based on patient status, additional functional criteria and insurance authorization.  Follow Up Recommendations No  PT follow up    Assistance Recommended at Discharge PRN  Patient can return home with the following       Equipment Recommendations None recommended by PT  Recommendations for Other Services       Functional Status Assessment Patient has had a recent decline in their functional status and demonstrates the ability to make significant improvements in function in a reasonable and predictable amount of time.     Precautions / Restrictions Precautions Precautions: Fall Restrictions Weight Bearing Restrictions: No      Mobility  Bed Mobility Overal bed mobility: Independent                  Transfers Overall transfer level: Independent Equipment used: None                    Ambulation/Gait Ambulation/Gait assistance: Modified independent (Device/Increase time)   Assistive device: None Gait Pattern/deviations: WFL(Within Functional Limits) Gait velocity: decreased speed     General Gait Details: patient ambulates with guarded walking posture, due to fear of becoming hightheaded due to her A-Fib. Patient reports "Once I can get my A-Fib under control I will be fine"  Stairs            Wheelchair Mobility    Modified Rankin (Stroke Patients Only)       Balance Overall balance assessment: Modified Independent                                           Pertinent Vitals/Pain Pain Assessment Pain Assessment: No/denies pain    Home Living  Family/patient expects to be discharged to:: Private residence Living Arrangements: Spouse/significant other;Other relatives (Husband, Son, and 3 grandkids (all in there teens)) Available Help at Discharge: Family;Friend(s);Available 24 hours/day;Available PRN/intermittently Type of Home: House Home Access: Stairs to enter Entrance Stairs-Rails: Right;Left;Can reach both Entrance Stairs-Number of Steps: 5   Home Layout: Two level;Able to live on main level with bedroom/bathroom;Other  (Comment) (Will be getting the master sweet redone to be more handicap accessible) Home Equipment: Cane - single point      Prior Function               Mobility Comments: uses SPC intermittently when her hips are bother her however does not use all the time       Hand Dominance   Dominant Hand: Right    Extremity/Trunk Assessment   Upper Extremity Assessment Upper Extremity Assessment: Overall WFL for tasks assessed    Lower Extremity Assessment Lower Extremity Assessment: Overall WFL for tasks assessed       Communication   Communication: No difficulties  Cognition Arousal/Alertness: Awake/alert Behavior During Therapy: WFL for tasks assessed/performed Overall Cognitive Status: Within Functional Limits for tasks assessed                                 General Comments: A&Ox3 self location and situation        General Comments General comments (skin integrity, edema, etc.): HR in 80-90s in supine, however upon sitting and functional activity, HR increased between 124-145bpm    Exercises Other Exercises Other Exercises: patient educated on role of PT in acute care setting, fall risk, and d/c recommendations   Assessment/Plan    PT Assessment Patient does not need any further PT services  PT Problem List Decreased activity tolerance       PT Treatment Interventions      PT Goals (Current goals can be found in the Care Plan section)  Acute Rehab PT Goals Patient Stated Goal: To get her A-fib under control and go home PT Goal Formulation: With patient Time For Goal Achievement: 08/12/21 Potential to Achieve Goals: Good    Frequency       Co-evaluation               AM-PAC PT "6 Clicks" Mobility  Outcome Measure Help needed turning from your back to your side while in a flat bed without using bedrails?: None Help needed moving from lying on your back to sitting on the side of a flat bed without using bedrails?: None Help  needed moving to and from a bed to a chair (including a wheelchair)?: None Help needed standing up from a chair using your arms (e.g., wheelchair or bedside chair)?: None Help needed to walk in hospital room?: None Help needed climbing 3-5 steps with a railing? : None 6 Click Score: 24    End of Session Equipment Utilized During Treatment: Gait belt Activity Tolerance: Patient tolerated treatment well Patient left: in bed;with call bell/phone within reach Nurse Communication: Mobility status      Time: 9371-6967 PT Time Calculation (min) (ACUTE ONLY): 15 min   Charges:   PT Evaluation $PT Eval Low Complexity: 1 Low        Iva Boop, PT  07/29/21. 8:49 AM

## 2021-07-30 DIAGNOSIS — Z8616 Personal history of COVID-19: Secondary | ICD-10-CM | POA: Diagnosis not present

## 2021-07-30 DIAGNOSIS — I10 Essential (primary) hypertension: Secondary | ICD-10-CM | POA: Diagnosis not present

## 2021-07-30 DIAGNOSIS — Z96641 Presence of right artificial hip joint: Secondary | ICD-10-CM | POA: Diagnosis present

## 2021-07-30 DIAGNOSIS — I4892 Unspecified atrial flutter: Secondary | ICD-10-CM | POA: Diagnosis not present

## 2021-07-30 DIAGNOSIS — Z20822 Contact with and (suspected) exposure to covid-19: Secondary | ICD-10-CM | POA: Diagnosis present

## 2021-07-30 DIAGNOSIS — I480 Paroxysmal atrial fibrillation: Secondary | ICD-10-CM | POA: Diagnosis not present

## 2021-07-30 DIAGNOSIS — Q761 Klippel-Feil syndrome: Secondary | ICD-10-CM | POA: Diagnosis not present

## 2021-07-30 DIAGNOSIS — I4891 Unspecified atrial fibrillation: Secondary | ICD-10-CM | POA: Diagnosis not present

## 2021-07-30 DIAGNOSIS — K8681 Exocrine pancreatic insufficiency: Secondary | ICD-10-CM | POA: Diagnosis present

## 2021-07-30 DIAGNOSIS — Z803 Family history of malignant neoplasm of breast: Secondary | ICD-10-CM | POA: Diagnosis not present

## 2021-07-30 DIAGNOSIS — F4310 Post-traumatic stress disorder, unspecified: Secondary | ICD-10-CM | POA: Diagnosis present

## 2021-07-30 DIAGNOSIS — K8689 Other specified diseases of pancreas: Secondary | ICD-10-CM | POA: Diagnosis not present

## 2021-07-30 DIAGNOSIS — Z981 Arthrodesis status: Secondary | ICD-10-CM | POA: Diagnosis not present

## 2021-07-30 DIAGNOSIS — R112 Nausea with vomiting, unspecified: Secondary | ICD-10-CM | POA: Diagnosis not present

## 2021-07-30 DIAGNOSIS — F411 Generalized anxiety disorder: Secondary | ICD-10-CM | POA: Diagnosis present

## 2021-07-30 DIAGNOSIS — G4733 Obstructive sleep apnea (adult) (pediatric): Secondary | ICD-10-CM | POA: Diagnosis not present

## 2021-07-30 DIAGNOSIS — K5730 Diverticulosis of large intestine without perforation or abscess without bleeding: Secondary | ICD-10-CM | POA: Diagnosis present

## 2021-07-30 DIAGNOSIS — R197 Diarrhea, unspecified: Secondary | ICD-10-CM | POA: Diagnosis not present

## 2021-07-30 DIAGNOSIS — I471 Supraventricular tachycardia: Secondary | ICD-10-CM | POA: Diagnosis present

## 2021-07-30 DIAGNOSIS — I700 Atherosclerosis of aorta: Secondary | ICD-10-CM | POA: Diagnosis present

## 2021-07-30 DIAGNOSIS — E876 Hypokalemia: Secondary | ICD-10-CM | POA: Diagnosis present

## 2021-07-30 DIAGNOSIS — E7800 Pure hypercholesterolemia, unspecified: Secondary | ICD-10-CM | POA: Diagnosis present

## 2021-07-30 DIAGNOSIS — Z818 Family history of other mental and behavioral disorders: Secondary | ICD-10-CM | POA: Diagnosis not present

## 2021-07-30 DIAGNOSIS — J45909 Unspecified asthma, uncomplicated: Secondary | ICD-10-CM | POA: Diagnosis present

## 2021-07-30 DIAGNOSIS — K529 Noninfective gastroenteritis and colitis, unspecified: Secondary | ICD-10-CM | POA: Diagnosis present

## 2021-07-30 DIAGNOSIS — I4819 Other persistent atrial fibrillation: Secondary | ICD-10-CM | POA: Diagnosis not present

## 2021-07-30 DIAGNOSIS — D509 Iron deficiency anemia, unspecified: Secondary | ICD-10-CM | POA: Diagnosis present

## 2021-07-30 DIAGNOSIS — K219 Gastro-esophageal reflux disease without esophagitis: Secondary | ICD-10-CM | POA: Diagnosis present

## 2021-07-30 DIAGNOSIS — F32A Depression, unspecified: Secondary | ICD-10-CM | POA: Diagnosis present

## 2021-07-30 LAB — BASIC METABOLIC PANEL
Anion gap: 9 (ref 5–15)
BUN: 11 mg/dL (ref 8–23)
CO2: 24 mmol/L (ref 22–32)
Calcium: 9.2 mg/dL (ref 8.9–10.3)
Chloride: 106 mmol/L (ref 98–111)
Creatinine, Ser: 0.8 mg/dL (ref 0.44–1.00)
GFR, Estimated: >60 mL/min (ref >60)
Glucose, Bld: 106 mg/dL — ABNORMAL HIGH (ref 70–99)
Potassium: 4 mmol/L (ref 3.5–5.1)
Sodium: 139 mmol/L (ref 135–145)

## 2021-07-30 LAB — CBC
HCT: 39.6 % (ref 36.0–46.0)
Hemoglobin: 13.3 g/dL (ref 12.0–15.0)
MCH: 29.2 pg (ref 26.0–34.0)
MCHC: 33.6 g/dL (ref 30.0–36.0)
MCV: 86.8 fL (ref 80.0–100.0)
Platelets: 341 10*3/uL (ref 150–400)
RBC: 4.56 MIL/uL (ref 3.87–5.11)
RDW: 13.2 % (ref 11.5–15.5)
WBC: 6.2 10*3/uL (ref 4.0–10.5)
nRBC: 0 % (ref 0.0–0.2)

## 2021-07-30 LAB — URINE CULTURE: Culture: <10000 — AB

## 2021-07-30 MED ORDER — ACETAMINOPHEN 650 MG RE SUPP: 650.0000 mg | Freq: Four times a day (QID) | RECTAL | Status: DC | PRN

## 2021-07-30 MED ORDER — ACETAMINOPHEN 325 MG PO TABS
650.0000 mg | ORAL_TABLET | Freq: Four times a day (QID) | ORAL | Status: DC | PRN
  Administered 2021-07-31: 650 mg via ORAL
  Filled 2021-07-30: qty 2

## 2021-07-30 MED ORDER — PANCRELIPASE (LIP-PROT-AMYL) 12000-38000 UNITS PO CPEP: 24000.0000 [IU] | ORAL_CAPSULE | ORAL | Status: DC | PRN

## 2021-07-30 MED ORDER — METOPROLOL TARTRATE 25 MG PO TABS
12.5000 mg | ORAL_TABLET | Freq: Four times a day (QID) | ORAL | Status: DC
  Administered 2021-07-30 – 2021-07-31 (×8): 12.5 mg via ORAL
  Filled 2021-07-30 (×7): qty 1

## 2021-07-30 MED ORDER — ONDANSETRON HCL 4 MG/2ML IJ SOLN
4.0000 mg | Freq: Four times a day (QID) | INTRAMUSCULAR | Status: DC | PRN
  Administered 2021-07-30 – 2021-08-01 (×2): 4 mg via INTRAVENOUS
  Filled 2021-07-30 (×2): qty 2

## 2021-07-30 MED ORDER — PANCRELIPASE (LIP-PROT-AMYL) 12000-38000 UNITS PO CPEP
24000.0000 [IU] | ORAL_CAPSULE | Freq: Three times a day (TID) | ORAL | Status: DC
  Administered 2021-07-30 – 2021-08-01 (×6): 24000 [IU] via ORAL
  Filled 2021-07-30 (×6): qty 2

## 2021-07-30 NOTE — Plan of Care (Signed)

## 2021-07-30 NOTE — Progress Notes (Signed)
   Cardizem drip stopped, BP is 87/59 with a pulse rate of 89, map of 69.  Notified the doctor and will retake vitals in an hour.

## 2021-07-30 NOTE — Progress Notes (Signed)
Progress Note  Patient Name: [name] Date of Encounter: 07/30/2021  [facility] Cardiologist: Kate Sable, MD   Subjective   Still in afib/flutter with relatively controlled rates. IV dilt held for soft pressures. Labs stable this AM. No further N/V/D.   Inpatient Medications    Scheduled Meds:  ALPRAZolam  1 mg Oral QHS   apixaban  5 mg Oral BID   busPIRone  15 mg Oral BID   lipase/protease/amylase  72,000 Units Oral TID WC   montelukast  10 mg Oral QHS   pantoprazole  40 mg Oral BID   Continuous Infusions:  diltiazem (CARDIZEM) infusion Stopped (07/30/21 0433)   PRN Meds: acetaminophen **OR** acetaminophen, diltiazem, lipase/protease/amylase **AND** lipase/protease/amylase, morphine, sodium chloride   Vital Signs    Vitals:   07/29/21 2026 07/30/21 0044 07/30/21 0355 07/30/21 0530  BP: 126/81 113/74 (!) 87/59 101/77  Pulse: 79 84 89 88  Resp:  16 17   Temp:  98.8 F (37.1 C) 98.1 F (36.7 C)   TempSrc:  Oral    SpO2: 100% 96% 97% 97%  Weight:        Intake/Output Summary (Last 24 hours) at 07/30/2021 0732 Last data filed at 07/30/2021 0433 Gross per 24 hour  Intake 1308.19 ml  Output 1250 ml  Net 58.19 ml      07/27/2021    2:02 PM 07/14/2021    1:30 PM 06/13/2021    9:58 AM  Last 3 Weights  Weight (lbs) 154 lb 154 lb 2 oz 150 lb  Weight (kg) 69.854 kg 69.911 kg 68.04 kg      Telemetry    Afib/flutter, HR 80-100s - Personally Reviewed  ECG    No new - Personally Reviewed  Physical Exam   GEN: No acute distress.   Neck: No JVD Cardiac: Irreg Irreg, no murmurs, rubs, or gallops.  Respiratory: Clear to auscultation bilaterally. GI: Soft, nontender, non-distended  MS: No edema; No deformity. Neuro:  Nonfocal  Psych: Normal affect   Labs    High Sensitivity Troponin:   Recent Labs  Lab 07/27/21 1405 07/27/21 1741  TROPONINIHS 9 9     Chemistry Recent Labs  Lab 07/27/21 1405 07/28/21 0731 07/29/21 0847  07/30/21 0512  NA 139 141 139 139  K 3.7 3.5 3.4* 4.0  CL 104 109 105 106  CO2 '22 24 24 24  '$ GLUCOSE 140* 107* 97 106*  BUN '12 8 9 11  '$ CREATININE 0.95 0.73 0.80 0.80  CALCIUM 9.8 8.9 9.1 9.2  MG  --   --  2.3  --   PROT 7.9  --   --   --   ALBUMIN 4.4  --   --   --   AST 19  --   --   --   ALT 11  --   --   --   ALKPHOS 125  --   --   --   BILITOT 0.9  --   --   --   GFRNONAA >60 >60 >60 >60  ANIONGAP '13 8 10 9    '$ Lipids No results for input(s): CHOL, TRIG, HDL, LABVLDL, LDLCALC, CHOLHDL in the last 168 hours.  Hematology Recent Labs  Lab 07/28/21 0731 07/29/21 0847 07/30/21 0512  WBC 5.6 6.6 6.2  RBC 4.26 4.91 4.56  HGB 12.5 14.2 13.3  HCT 36.8 42.5 39.6  MCV 86.4 86.6 86.8  MCH 29.3 28.9 29.2  MCHC 34.0 33.4 33.6  RDW 13.2 13.4 13.2  PLT 328 358 341   Thyroid No results for input(s): TSH, FREET4 in the last 168 hours.  BNPNo results for input(s): BNP, PROBNP in the last 168 hours.  DDimer No results for input(s): DDIMER in the last 168 hours.   Radiology    CT ABDOMEN PELVIS WO CONTRAST  Result Date: 07/28/2021 CLINICAL DATA:  Nausea, vomiting, diarrhea EXAM: CT ABDOMEN AND PELVIS WITHOUT CONTRAST TECHNIQUE: Multidetector CT imaging of the abdomen and pelvis was performed following the standard protocol without IV contrast. RADIATION DOSE REDUCTION: This exam was performed according to the departmental dose-optimization program which includes automated exposure control, adjustment of the mA and/or kV according to patient size and/or use of iterative reconstruction technique. COMPARISON:  05/01/2020 FINDINGS: Lower chest: No acute abnormality. Hepatobiliary: No focal liver abnormality is seen. Prior cholecystectomy. Pancreas: Unremarkable. No pancreatic ductal dilatation or surrounding inflammatory changes. Spleen: Normal in size without focal abnormality. Adrenals/Urinary Tract: Adrenal glands are unremarkable. Kidneys are normal, without renal calculi, focal lesion, or  hydronephrosis. Bladder is unremarkable. Stomach/Bowel: Stomach is within normal limits. No evidence of bowel wall thickening, distention, or inflammatory changes. Appendix is normal. Vascular/Lymphatic: Normal caliber abdominal aorta with mild atherosclerosis. No lymphadenopathy. Reproductive: Status post hysterectomy. No adnexal masses. Other: No abdominal wall hernia or abnormality. No abdominopelvic ascites. Musculoskeletal: No acute osseous abnormality. No aggressive osseous lesion. Right total hip arthroplasty. Posterior spinal fusion from T10 through L4. IMPRESSION: 1. No acute abdominal or pelvic pathology. 2. Aortic Atherosclerosis (ICD10-I70.0). Electronically Signed   By: Kathreen Devoid M.D.   On: 07/28/2021 11:28    Cardiac Studies     Echo TEE 08/05/20 PROCEDURE: After discussion of the risks and benefits of a TEE, an  informed consent was obtained from the patient. The transesophogeal probe  was passed without difficulty through the esophogus of the patient. Imaged  were obtained with the patient in a  left lateral decubitus position. Local oropharyngeal anesthetic was  provided with Cetacaine. Sedation performed by different physician. The  patient was monitored while under deep sedation. Anesthestetic sedation  was provided intravenously by  Anesthesiology: '230mg'$  of Propofol. The patient developed no complications  during the procedure.   IMPRESSIONS    1. 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   2. Left ventricular ejection fraction, by estimation, is 55 to 60%. The  left ventricle has normal function.   3. Right ventricular systolic function is normal. The right ventricular  size is normal.   4. Left atrial size was mildly dilated. No left atrial/left atrial  appendage thrombus was detected.   5. The mitral valve is normal in structure. Mild mitral valve  regurgitation.   6. The aortic valve is tricuspid. Aortic valve regurgitation is  trivial.   7. There is mild (Grade II) plaque involving the descending aorta.    Echo 07/22/20  1. Left ventricular ejection fraction, by estimation, is 60 to 65%. The  left ventricle has normal function. The left ventricle has no regional  wall motion abnormalities. Left ventricular diastolic parameters are  indeterminate.   2. Right ventricular systolic function is normal. The right ventricular  size is normal. There is mildly elevated pulmonary artery systolic  pressure. The estimated right ventricular systolic pressure is 46.8 mmHg.   3. Left atrial size was mild to moderately dilated.   4. Unable to exclude mild mitral valve prolapse of the posterior leaflet.  Moderate mitral valve regurgitation. No evidence of mitral stenosis.    [facility] 05/2019 Conclusion  Conclusions: No angiographically significant coronary artery disease. Normal left ventricular contraction with mildly elevated filling pressure, suggestive of diastolic heart failure.   Recommendations: Primary prevention of coronary artery disease. Initiate furosemide 40 mg p.o. every other day, as ordered by Dr. Caryl Comes earlier today. If no evidence of bleeding or vascular injury at right radial catheterization site, apixaban can be restarted tomorrow. Follow-up with Dr. Garen Lah or APP in ~2 weeks.   Nelva Bush, MD [facility]  Patient Profile     61 y.o. female with a hx of afib/flutter s/p ablation 08/06/20 on Eliquis, HTN, asthma, anxiety, GERD OSAwho is being seen 07/29/2021 for the evaluation of Afib RVR   Assessment & Plan    Afib RVR H/o persistent Afib - h/o afib/flutter with failed cardioversion and prior ablation 07/2020 followed by EP. H/o sinus bradycardia on amiodarone. Historically very symptomatic in afib. - Afib RVR rates up to the 160s in the setting of severe N/V/D  - In the ER she was given IV dilt bolus '10mg'$ /hr and dilt '50mg'$  PO with  - PTA dilt '120mg'$  daily>> dilt increased up to '180mg'$  with  little improvement - started on IV dilt 5/19 with improvement of rates, however was held for hypotension - she remains in Afib with relatively controlled rates, patient feels very symptomatic on exertion - denies missing Eliquis doses - Patient is wanting to stay over the weekend for cardioversion on Monday as she has not missed doses of Eliquis. Needs to see EP as OP.    Nausea/vomiting/diarrhea - resolved - tx per IM    For questions or updates, please contact [place] Please consult [email or web address] for contact info under        Signed, Elvis Boot Ninfa Meeker, PA-C  07/30/2021, 7:32 AM

## 2021-07-30 NOTE — Progress Notes (Signed)
PROGRESS NOTE    [name]  [id number]:[id number] DOB: 07/09/1960 DOA: 07/27/2021 PCP: Virginia Crews, MD   Assessment & Plan:   Principal Problem:   Intractable nausea and vomiting Active Problems:   Cervical fusion syndrome   Chronic anticoagulation (Eliquis)   Anxiety state   Clinical depression   Colon, diverticulosis   OSA (obstructive sleep apnea)   Hypertension   Pure hypercholesterolemia   Chronic use of opiate for therapeutic purpose   Atrial fibrillation with RVR (HCC)   Diarrhea  Assessment and Plan: PAF: w/ RVR. Continue on IV diltiazem drip and wean as tolerated. Started on metoprolol and continue on eliquis. May have a cardioversion on 08/01/21 as per cardio   Intractable nausea and vomiting: etiology unclear. Hx of pancreatic insufficiency. Decreased home dose of creon as per pt's request. CT abd/pelvis shows no acute abd or pelvic pathology. Intermittent. Zofran prn   Cervical fusion syndrome: continue on home dose of morphine      Diarrhea: etiology unclear, GI PCR panel & c. diff are both neg. Diarrhea resolved   HTN: continue on increased dose of diltiazem    Depression: severity unknown. Continue on home dose of buspirone      DVT prophylaxis: eliquis  Code Status: full  Family Communication:  Disposition Plan: likely d/c back home   Level of care: Progressive  Status is: Inpatient Remains inpatient appropriate because: requiring IV dilt      Consultants:  Cardio   Procedures:   Antimicrobials:    Subjective: Pt c/o palpitations and nausea   Objective: Vitals:   07/30/21 0044 07/30/21 0355 07/30/21 0530 07/30/21 0749  BP: 113/74 (!) 87/59 101/77 121/90  Pulse: 84 89 88 (!) 124  Resp: '16 17  16  '$ Temp: 98.8 F (37.1 C) 98.1 F (36.7 C)  98.5 F (36.9 C)  TempSrc: Oral     SpO2: 96% 97% 97% 96%  Weight:        Intake/Output Summary (Last 24 hours) at 07/30/2021 0826 Last data filed at 07/30/2021 0433 Gross per 24 hour   Intake 1308.19 ml  Output 1250 ml  Net 58.19 ml   Filed Weights   07/27/21 1402  Weight: 69.9 kg    Examination:  General exam: Appears uncomfortable   Respiratory system: clear breath sounds b/l  Cardiovascular system: irregularly irregular. No rubs or gallops Gastrointestinal system: Abd is soft, NT, ND & normal bowel sounds  Central nervous system: alert and oriented. Moves all extremities Psychiatry: judgement and insight appear normal. Flat mood and affect     Data Reviewed: I have personally reviewed following labs and imaging studies  CBC: Recent Labs  Lab 07/27/21 1405 07/28/21 0731 07/29/21 0847 07/30/21 0512  WBC 8.5 5.6 6.6 6.2  HGB 15.9* 12.5 14.2 13.3  HCT 46.9* 36.8 42.5 39.6  MCV 86.5 86.4 86.6 86.8  PLT 467* 328 358 322   Basic Metabolic Panel: Recent Labs  Lab 07/27/21 1405 07/28/21 0731 07/29/21 0847 07/30/21 0512  NA 139 141 139 139  K 3.7 3.5 3.4* 4.0  CL 104 109 105 106  CO2 '22 24 24 24  '$ GLUCOSE 140* 107* 97 106*  BUN '12 8 9 11  '$ CREATININE 0.95 0.73 0.80 0.80  CALCIUM 9.8 8.9 9.1 9.2  MG  --   --  2.3  --    GFR: Estimated Creatinine Clearance: 70 mL/min (by C-G formula based on SCr of 0.8 mg/dL). Liver Function Tests: Recent Labs  Lab 07/27/21 1405  AST 19  ALT 11  ALKPHOS 125  BILITOT 0.9  PROT 7.9  ALBUMIN 4.4   Recent Labs  Lab 07/27/21 1405  LIPASE 29   No results for input(s): AMMONIA in the last 168 hours. Coagulation Profile: No results for input(s): INR, PROTIME in the last 168 hours. Cardiac Enzymes: No results for input(s): CKTOTAL, CKMB, CKMBINDEX, TROPONINI in the last 168 hours. BNP (last 3 results) No results for input(s): PROBNP in the last 8760 hours. HbA1C: No results for input(s): HGBA1C in the last 72 hours. CBG: No results for input(s): GLUCAP in the last 168 hours. Lipid Profile: No results for input(s): CHOL, HDL, LDLCALC, TRIG, CHOLHDL, LDLDIRECT in the last 72 hours. Thyroid Function  Tests: No results for input(s): TSH, T4TOTAL, FREET4, T3FREE, THYROIDAB in the last 72 hours. Anemia Panel: No results for input(s): VITAMINB12, FOLATE, FERRITIN, TIBC, IRON, RETICCTPCT in the last 72 hours. Sepsis Labs: No results for input(s): PROCALCITON, LATICACIDVEN in the last 168 hours.  Recent Results (from the past 240 hour(s))  Resp Panel by RT-PCR (Flu A&B, Covid) Nasopharyngeal Swab     Status: None   Collection Time: 07/27/21  8:09 PM   Specimen: Nasopharyngeal Swab; Nasopharyngeal(NP) swabs in vial transport medium  Result Value Ref Range Status   SARS Coronavirus 2 by RT PCR NEGATIVE NEGATIVE Final    Comment: (NOTE) SARS-CoV-2 target nucleic acids are NOT DETECTED.  The SARS-CoV-2 RNA is generally detectable in upper respiratory specimens during the acute phase of infection. The lowest concentration of SARS-CoV-2 viral copies this assay can detect is 138 copies/mL. A negative result does not preclude SARS-Cov-2 infection and should not be used as the sole basis for treatment or other patient management decisions. A negative result may occur with  improper specimen collection/handling, submission of specimen other than nasopharyngeal swab, presence of viral mutation(s) within the areas targeted by this assay, and inadequate number of viral copies(<138 copies/mL). A negative result must be combined with clinical observations, patient history, and epidemiological information. The expected result is Negative.  Fact Sheet for Patients:  EntrepreneurPulse.com.au  Fact Sheet for Healthcare Providers:  IncredibleEmployment.be  This test is no t yet approved or cleared by the [place] FDA and  has been authorized for detection and/or diagnosis of SARS-CoV-2 by FDA under an Emergency Use Authorization (EUA). This EUA will remain  in effect (meaning this test can be used) for the duration of the COVID-19 declaration under Section  564(b)(1) of the Act, 21 U.S.C.section 360bbb-3(b)(1), unless the authorization is terminated  or revoked sooner.       Influenza A by PCR NEGATIVE NEGATIVE Final   Influenza B by PCR NEGATIVE NEGATIVE Final    Comment: (NOTE) The Xpert Xpress SARS-CoV-2/FLU/RSV plus assay is intended as an aid in the diagnosis of influenza from Nasopharyngeal swab specimens and should not be used as a sole basis for treatment. Nasal washings and aspirates are unacceptable for Xpert Xpress SARS-CoV-2/FLU/RSV testing.  Fact Sheet for Patients: EntrepreneurPulse.com.au  Fact Sheet for Healthcare Providers: IncredibleEmployment.be  This test is not yet approved or cleared by the [place] FDA and has been authorized for detection and/or diagnosis of SARS-CoV-2 by FDA under an Emergency Use Authorization (EUA). This EUA will remain in effect (meaning this test can be used) for the duration of the COVID-19 declaration under Section 564(b)(1) of the Act, 21 U.S.C. section 360bbb-3(b)(1), unless the authorization is terminated or revoked.  Performed at [facility], [place], [place]  [address]   Gastrointestinal Panel by PCR , Stool     Status: None   Collection Time: 07/28/21  9:34 AM   Specimen: Stool  Result Value Ref Range Status   Campylobacter species NOT DETECTED NOT DETECTED Final   Plesimonas shigelloides NOT DETECTED NOT DETECTED Final   Salmonella species NOT DETECTED NOT DETECTED Final   Yersinia enterocolitica NOT DETECTED NOT DETECTED Final   Vibrio species NOT DETECTED NOT DETECTED Final   Vibrio cholerae NOT DETECTED NOT DETECTED Final   Enteroaggregative E coli (EAEC) NOT DETECTED NOT DETECTED Final   Enteropathogenic E coli (EPEC) NOT DETECTED NOT DETECTED Final   Enterotoxigenic E coli (ETEC) NOT DETECTED NOT DETECTED Final   Shiga like toxin producing E coli (STEC) NOT DETECTED NOT DETECTED Final    Shigella/Enteroinvasive E coli (EIEC) NOT DETECTED NOT DETECTED Final   Cryptosporidium NOT DETECTED NOT DETECTED Final   Cyclospora cayetanensis NOT DETECTED NOT DETECTED Final   Entamoeba histolytica NOT DETECTED NOT DETECTED Final   Giardia lamblia NOT DETECTED NOT DETECTED Final   Adenovirus F40/41 NOT DETECTED NOT DETECTED Final   Astrovirus NOT DETECTED NOT DETECTED Final   Norovirus GI/GII NOT DETECTED NOT DETECTED Final   Rotavirus A NOT DETECTED NOT DETECTED Final   Sapovirus (I, II, IV, and V) NOT DETECTED NOT DETECTED Final    Comment: Performed at [facility], [address] [place], [place] [address]  C Difficile Quick Screen w PCR reflex     Status: None   Collection Time: 07/28/21  9:34 AM   Specimen: Stool  Result Value Ref Range Status   C Diff antigen NEGATIVE NEGATIVE Final   C Diff toxin NEGATIVE NEGATIVE Final   C Diff interpretation No C. difficile detected.  Final    Comment: Performed at [facility], [address] [place], [place] [address]  MRSA Next Gen by PCR, Nasal     Status: None   Collection Time: 07/28/21  7:00 PM   Specimen: Nasal Mucosa; Nasal Swab  Result Value Ref Range Status   MRSA by PCR Next Gen NOT DETECTED NOT DETECTED Final    Comment: (NOTE) The GeneXpert MRSA Assay (FDA approved for NASAL specimens only), is one component of a comprehensive MRSA colonization surveillance program. It is not intended to diagnose MRSA infection nor to guide or monitor treatment for MRSA infections. Test performance is not FDA approved in patients less than 63 years old. Performed at [facility], [address] [place],  [address]          Radiology Studies: CT ABDOMEN PELVIS WO CONTRAST  Result Date: 07/28/2021 CLINICAL DATA:  Nausea, vomiting, diarrhea EXAM: CT ABDOMEN AND PELVIS WITHOUT CONTRAST TECHNIQUE: Multidetector CT imaging of the abdomen and pelvis was performed following the standard protocol  without IV contrast. RADIATION DOSE REDUCTION: This exam was performed according to the departmental dose-optimization program which includes automated exposure control, adjustment of the mA and/or kV according to patient size and/or use of iterative reconstruction technique. COMPARISON:  05/01/2020 FINDINGS: Lower chest: No acute abnormality. Hepatobiliary: No focal liver abnormality is seen. Prior cholecystectomy. Pancreas: Unremarkable. No pancreatic ductal dilatation or surrounding inflammatory changes. Spleen: Normal in size without focal abnormality. Adrenals/Urinary Tract: Adrenal glands are unremarkable. Kidneys are normal, without renal calculi, focal lesion, or hydronephrosis. Bladder is unremarkable. Stomach/Bowel: Stomach is within normal limits. No evidence of bowel wall thickening, distention, or inflammatory changes. Appendix is normal. Vascular/Lymphatic: Normal caliber abdominal aorta with mild atherosclerosis.  No lymphadenopathy. Reproductive: Status post hysterectomy. No adnexal masses. Other: No abdominal wall hernia or abnormality. No abdominopelvic ascites. Musculoskeletal: No acute osseous abnormality. No aggressive osseous lesion. Right total hip arthroplasty. Posterior spinal fusion from T10 through L4. IMPRESSION: 1. No acute abdominal or pelvic pathology. 2. Aortic Atherosclerosis (ICD10-I70.0). Electronically Signed   By: Kathreen Devoid M.D.   On: 07/28/2021 11:28        Scheduled Meds:  ALPRAZolam  1 mg Oral QHS   apixaban  5 mg Oral BID   busPIRone  15 mg Oral BID   lipase/protease/amylase  72,000 Units Oral TID WC   montelukast  10 mg Oral QHS   pantoprazole  40 mg Oral BID   Continuous Infusions:  diltiazem (CARDIZEM) infusion Stopped (07/30/21 0433)      LOS: 0 days    Time spent: 33 mins     Wyvonnia Dusky, MD [facility] Pager [phone]  If 7PM-7AM, please contact night-coverage 07/30/2021, 8:26 AM

## 2021-07-31 ENCOUNTER — Inpatient Hospital Stay (HOSPITAL_COMMUNITY)
Admit: 2021-07-31 | Discharge: 2021-07-31 | Disposition: A | Discharge status: Home / Self Care | Payer: Medicare Other | Attending: Cardiology | Admitting: Cardiology

## 2021-07-31 DIAGNOSIS — Q761 Klippel-Feil syndrome: Secondary | ICD-10-CM | POA: Diagnosis not present

## 2021-07-31 DIAGNOSIS — I480 Paroxysmal atrial fibrillation: Secondary | ICD-10-CM | POA: Diagnosis not present

## 2021-07-31 DIAGNOSIS — I4891 Unspecified atrial fibrillation: Secondary | ICD-10-CM | POA: Diagnosis not present

## 2021-07-31 DIAGNOSIS — I10 Essential (primary) hypertension: Secondary | ICD-10-CM | POA: Diagnosis not present

## 2021-07-31 LAB — ECHOCARDIOGRAM COMPLETE
AR max vel: 3.02 cm2
AV Peak grad: 2.7 mmHg
Ao pk vel: 0.81 m/s
Area-P 1/2: 4.19 cm2
Calc EF: 55.3 %
S' Lateral: 2.6 cm
Single Plane A2C EF: 54.5 %
Single Plane A4C EF: 61.3 %
Weight: 2464 oz

## 2021-07-31 LAB — CBC
HCT: 40.2 % (ref 36.0–46.0)
Hemoglobin: 13.5 g/dL (ref 12.0–15.0)
MCH: 29.2 pg (ref 26.0–34.0)
MCHC: 33.6 g/dL (ref 30.0–36.0)
MCV: 87 fL (ref 80.0–100.0)
Platelets: 343 10*3/uL (ref 150–400)
RBC: 4.62 MIL/uL (ref 3.87–5.11)
RDW: 13.1 % (ref 11.5–15.5)
WBC: 7.3 10*3/uL (ref 4.0–10.5)
nRBC: 0 % (ref 0.0–0.2)

## 2021-07-31 LAB — BASIC METABOLIC PANEL
Anion gap: 7 (ref 5–15)
BUN: 16 mg/dL (ref 8–23)
CO2: 23 mmol/L (ref 22–32)
Calcium: 8.9 mg/dL (ref 8.9–10.3)
Chloride: 108 mmol/L (ref 98–111)
Creatinine, Ser: 0.88 mg/dL (ref 0.44–1.00)
GFR, Estimated: >60 mL/min (ref >60)
Glucose, Bld: 109 mg/dL — ABNORMAL HIGH (ref 70–99)
Potassium: 3.9 mmol/L (ref 3.5–5.1)
Sodium: 138 mmol/L (ref 135–145)

## 2021-07-31 MED ORDER — DILTIAZEM HCL 30 MG PO TABS
30.0000 mg | ORAL_TABLET | Freq: Four times a day (QID) | ORAL | Status: DC
  Administered 2021-07-31 – 2021-08-01 (×4): 30 mg via ORAL
  Filled 2021-07-31 (×4): qty 1

## 2021-07-31 NOTE — Progress Notes (Signed)
PROGRESS NOTE    [name]  [id number]:[id number] DOB: October 28, 1960 DOA: 07/27/2021 PCP: Virginia Crews, MD   Assessment & Plan:   Principal Problem:   Intractable nausea and vomiting Active Problems:   Cervical fusion syndrome   Chronic anticoagulation (Eliquis)   Anxiety state   Clinical depression   Colon, diverticulosis   OSA (obstructive sleep apnea)   Hypertension   Pure hypercholesterolemia   Chronic use of opiate for therapeutic purpose   Atrial fibrillation with RVR (HCC)   Diarrhea   Atrial fibrillation (HCC)  Assessment and Plan: PAF: w/ RVR. D/c IV diltiazem drip. Continue on metoprolol, cardizem & eliquis. Cardioversion tomorrow as per cardio. NPO after midnight   Intractable nausea and vomiting: etiology unclear. Hx of pancreatic insufficiency. Decreased home dose of creon as per pt's request. CT abd/pelvis shows no acute abd or pelvic pathology. Zofran prn. Intermittent  Cervical fusion syndrome: continue on home dose of morphine    Diarrhea: etiology unclear, GI PCR panel & c. diff are both neg. Diarrhea resolved   HTN: continue on cardizem    Depression: severity unknown. Continue on home dose of buspirone      DVT prophylaxis: eliquis  Code Status: full  Family Communication:  Disposition Plan: likely d/c back home   Level of care: Progressive  Status is: Inpatient Remains inpatient appropriate because: requiring IV dilt      Consultants:  Cardio   Procedures:   Antimicrobials:    Subjective: Pt c/o intermittent palpitations   Objective: Vitals:   07/30/21 2044 07/30/21 2348 07/31/21 0231 07/31/21 0558  BP: 100/75 (!) 83/63 106/85 107/76  Pulse: 80 71 79 87  Resp: '16 14  18  '$ Temp: 97.8 F (36.6 C) 98.3 F (36.8 C)    TempSrc:      SpO2: 98% 98% 96% 96%  Weight:        Intake/Output Summary (Last 24 hours) at 07/31/2021 0742 Last data filed at 07/30/2021 2030 Gross per 24 hour  Intake 720 ml  Output --  Net 720 ml    Filed Weights   07/27/21 1402  Weight: 69.9 kg    Examination:  General exam: Appears calm & comfortable   Respiratory system: clear breath sounds b/l Cardiovascular system: irregularly irregular. No rubs or clicks  Gastrointestinal system: Abd is soft, NT, ND & normal bowel sounds  Central nervous system: Alert and oriented. Moves all extremities  Psychiatry: judgement and insight appears normal. Flat mood and affect     Data Reviewed: I have personally reviewed following labs and imaging studies  CBC: Recent Labs  Lab 07/27/21 1405 07/28/21 0731 07/29/21 0847 07/30/21 0512 07/31/21 0426  WBC 8.5 5.6 6.6 6.2 7.3  HGB 15.9* 12.5 14.2 13.3 13.5  HCT 46.9* 36.8 42.5 39.6 40.2  MCV 86.5 86.4 86.6 86.8 87.0  PLT 467* 328 358 341 448   Basic Metabolic Panel: Recent Labs  Lab 07/27/21 1405 07/28/21 0731 07/29/21 0847 07/30/21 0512 07/31/21 0426  NA 139 141 139 139 138  K 3.7 3.5 3.4* 4.0 3.9  CL 104 109 105 106 108  CO2 '22 24 24 24 23  '$ GLUCOSE 140* 107* 97 106* 109*  BUN '12 8 9 11 16  '$ CREATININE 0.95 0.73 0.80 0.80 0.88  CALCIUM 9.8 8.9 9.1 9.2 8.9  MG  --   --  2.3  --   --    GFR: Estimated Creatinine Clearance: 63.6 mL/min (by C-G formula based on SCr of 0.88 mg/dL).  Liver Function Tests: Recent Labs  Lab 07/27/21 1405  AST 19  ALT 11  ALKPHOS 125  BILITOT 0.9  PROT 7.9  ALBUMIN 4.4   Recent Labs  Lab 07/27/21 1405  LIPASE 29   No results for input(s): AMMONIA in the last 168 hours. Coagulation Profile: No results for input(s): INR, PROTIME in the last 168 hours. Cardiac Enzymes: No results for input(s): CKTOTAL, CKMB, CKMBINDEX, TROPONINI in the last 168 hours. BNP (last 3 results) No results for input(s): PROBNP in the last 8760 hours. HbA1C: No results for input(s): HGBA1C in the last 72 hours. CBG: No results for input(s): GLUCAP in the last 168 hours. Lipid Profile: No results for input(s): CHOL, HDL, LDLCALC, TRIG, CHOLHDL,  LDLDIRECT in the last 72 hours. Thyroid Function Tests: No results for input(s): TSH, T4TOTAL, FREET4, T3FREE, THYROIDAB in the last 72 hours. Anemia Panel: No results for input(s): VITAMINB12, FOLATE, FERRITIN, TIBC, IRON, RETICCTPCT in the last 72 hours. Sepsis Labs: No results for input(s): PROCALCITON, LATICACIDVEN in the last 168 hours.  Recent Results (from the past 240 hour(s))  Resp Panel by RT-PCR (Flu A&B, Covid) Nasopharyngeal Swab     Status: None   Collection Time: 07/27/21  8:09 PM   Specimen: Nasopharyngeal Swab; Nasopharyngeal(NP) swabs in vial transport medium  Result Value Ref Range Status   SARS Coronavirus 2 by RT PCR NEGATIVE NEGATIVE Final    Comment: (NOTE) SARS-CoV-2 target nucleic acids are NOT DETECTED.  The SARS-CoV-2 RNA is generally detectable in upper respiratory specimens during the acute phase of infection. The lowest concentration of SARS-CoV-2 viral copies this assay can detect is 138 copies/mL. A negative result does not preclude SARS-Cov-2 infection and should not be used as the sole basis for treatment or other patient management decisions. A negative result may occur with  improper specimen collection/handling, submission of specimen other than nasopharyngeal swab, presence of viral mutation(s) within the areas targeted by this assay, and inadequate number of viral copies(<138 copies/mL). A negative result must be combined with clinical observations, patient history, and epidemiological information. The expected result is Negative.  Fact Sheet for Patients:  EntrepreneurPulse.com.au  Fact Sheet for Healthcare Providers:  IncredibleEmployment.be  This test is no t yet approved or cleared by the [place] FDA and  has been authorized for detection and/or diagnosis of SARS-CoV-2 by FDA under an Emergency Use Authorization (EUA). This EUA will remain  in effect (meaning this test can be used) for the  duration of the COVID-19 declaration under Section 564(b)(1) of the Act, 21 U.S.C.section 360bbb-3(b)(1), unless the authorization is terminated  or revoked sooner.       Influenza A by PCR NEGATIVE NEGATIVE Final   Influenza B by PCR NEGATIVE NEGATIVE Final    Comment: (NOTE) The Xpert Xpress SARS-CoV-2/FLU/RSV plus assay is intended as an aid in the diagnosis of influenza from Nasopharyngeal swab specimens and should not be used as a sole basis for treatment. Nasal washings and aspirates are unacceptable for Xpert Xpress SARS-CoV-2/FLU/RSV testing.  Fact Sheet for Patients: EntrepreneurPulse.com.au  Fact Sheet for Healthcare Providers: IncredibleEmployment.be  This test is not yet approved or cleared by the [place] FDA and has been authorized for detection and/or diagnosis of SARS-CoV-2 by FDA under an Emergency Use Authorization (EUA). This EUA will remain in effect (meaning this test can be used) for the duration of the COVID-19 declaration under Section 564(b)(1) of the Act, 21 U.S.C. section 360bbb-3(b)(1), unless the authorization is terminated or revoked.  Performed  at [facility], [address] [place], [place] [address]   Gastrointestinal Panel by PCR , Stool     Status: None   Collection Time: 07/28/21  9:34 AM   Specimen: Stool  Result Value Ref Range Status   Campylobacter species NOT DETECTED NOT DETECTED Final   Plesimonas shigelloides NOT DETECTED NOT DETECTED Final   Salmonella species NOT DETECTED NOT DETECTED Final   Yersinia enterocolitica NOT DETECTED NOT DETECTED Final   Vibrio species NOT DETECTED NOT DETECTED Final   Vibrio cholerae NOT DETECTED NOT DETECTED Final   Enteroaggregative E coli (EAEC) NOT DETECTED NOT DETECTED Final   Enteropathogenic E coli (EPEC) NOT DETECTED NOT DETECTED Final   Enterotoxigenic E coli (ETEC) NOT DETECTED NOT DETECTED Final   Shiga like toxin producing E coli  (STEC) NOT DETECTED NOT DETECTED Final   Shigella/Enteroinvasive E coli (EIEC) NOT DETECTED NOT DETECTED Final   Cryptosporidium NOT DETECTED NOT DETECTED Final   Cyclospora cayetanensis NOT DETECTED NOT DETECTED Final   Entamoeba histolytica NOT DETECTED NOT DETECTED Final   Giardia lamblia NOT DETECTED NOT DETECTED Final   Adenovirus F40/41 NOT DETECTED NOT DETECTED Final   Astrovirus NOT DETECTED NOT DETECTED Final   Norovirus GI/GII NOT DETECTED NOT DETECTED Final   Rotavirus A NOT DETECTED NOT DETECTED Final   Sapovirus (I, II, IV, and V) NOT DETECTED NOT DETECTED Final    Comment: Performed at [facility], [address] [place], [place] [address]  C Difficile Quick Screen w PCR reflex     Status: None   Collection Time: 07/28/21  9:34 AM   Specimen: Stool  Result Value Ref Range Status   C Diff antigen NEGATIVE NEGATIVE Final   C Diff toxin NEGATIVE NEGATIVE Final   C Diff interpretation No C. difficile detected.  Final    Comment: Performed at [facility], [address] [place], [place] [address]  MRSA Next Gen by PCR, Nasal     Status: None   Collection Time: 07/28/21  7:00 PM   Specimen: Nasal Mucosa; Nasal Swab  Result Value Ref Range Status   MRSA by PCR Next Gen NOT DETECTED NOT DETECTED Final    Comment: (NOTE) The GeneXpert MRSA Assay (FDA approved for NASAL specimens only), is one component of a comprehensive MRSA colonization surveillance program. It is not intended to diagnose MRSA infection nor to guide or monitor treatment for MRSA infections. Test performance is not FDA approved in patients less than 74 years old. Performed at [facility], [address] [place], [place] [address]   Urine Culture     Status: Abnormal   Collection Time: 07/29/21  4:54 AM   Specimen: Urine, Clean Catch  Result Value Ref Range Status   Specimen Description   Final    URINE, CLEAN CATCH Performed at [facility], [address] [place], [place] [address]    Special Requests   Final    NONE Performed at [facility], [address], [place], [place] [address]    Culture (A)  Final    <10,000 COLONIES/mL INSIGNIFICANT GROWTH Performed at [facility] [address] [place], [place] [address]    Report Status 07/30/2021 FINAL  Final         Radiology Studies: No results found.      Scheduled Meds:  ALPRAZolam  1 mg Oral QHS   apixaban  5 mg Oral BID   busPIRone  15 mg  Oral BID   lipase/protease/amylase  24,000 Units Oral TID WC   metoprolol tartrate  12.5 mg Oral QID   montelukast  10 mg Oral QHS   pantoprazole  40 mg Oral BID   Continuous Infusions:  diltiazem (CARDIZEM) infusion Stopped (07/31/21 0010)      LOS: 1 day    Time spent: 25 mins     Wyvonnia Dusky, MD [facility] Pager [phone]  If 7PM-7AM, please contact night-coverage 07/31/2021, 7:42 AM

## 2021-07-31 NOTE — Progress Notes (Addendum)
Progress Note  Patient Name: [name] Date of Encounter: 07/31/2021  [facility] Cardiologist: Kate Sable, MD   Subjective   Denies chest pain, nausea is better, palpitations improved.  Gets dizzy when she tries to move around.  Diltiazem drip was stopped last night due to low blood pressures.  Inpatient Medications    Scheduled Meds:  ALPRAZolam  1 mg Oral QHS   apixaban  5 mg Oral BID   busPIRone  15 mg Oral BID   diltiazem  30 mg Oral Q6H   lipase/protease/amylase  24,000 Units Oral TID WC   metoprolol tartrate  12.5 mg Oral QID   montelukast  10 mg Oral QHS   pantoprazole  40 mg Oral BID   Continuous Infusions:  PRN Meds: acetaminophen **OR** acetaminophen, lipase/protease/amylase **AND** lipase/protease/amylase, morphine, ondansetron (ZOFRAN) IV, sodium chloride   Vital Signs    Vitals:   07/31/21 0231 07/31/21 0558 07/31/21 0801 07/31/21 1114  BP: 106/85 107/76 112/86 118/83  Pulse: 79 87 79 83  Resp:  '18 18 19  '$ Temp:   98.3 F (36.8 C) 98 F (36.7 C)  TempSrc:   Oral Oral  SpO2: 96% 96% 98% 97%  Weight:        Intake/Output Summary (Last 24 hours) at 07/31/2021 1203 Last data filed at 07/31/2021 1114 Gross per 24 hour  Intake 1200 ml  Output --  Net 1200 ml      07/27/2021    2:02 PM 07/14/2021    1:30 PM 06/13/2021    9:58 AM  Last 3 Weights  Weight (lbs) 154 lb 154 lb 2 oz 150 lb  Weight (kg) 69.854 kg 69.911 kg 68.04 kg      Telemetry    Atrial flutter heart rate 84- Personally Reviewed  ECG     - Personally Reviewed  Physical Exam   GEN: No acute distress.   Neck: No JVD Cardiac: Irregular irregular Respiratory: Clear to auscultation bilaterally. GI: Soft, nontender, non-distended  MS: No edema; No deformity. Neuro:  Nonfocal  Psych: Normal affect   Labs    High Sensitivity Troponin:   Recent Labs  Lab 07/27/21 1405 07/27/21 1741  TROPONINIHS 9 9     Chemistry Recent Labs  Lab 07/27/21 1405  07/28/21 0731 07/29/21 0847 07/30/21 0512 07/31/21 0426  NA 139   < > 139 139 138  K 3.7   < > 3.4* 4.0 3.9  CL 104   < > 105 106 108  CO2 22   < > '24 24 23  '$ GLUCOSE 140*   < > 97 106* 109*  BUN 12   < > '9 11 16  '$ CREATININE 0.95   < > 0.80 0.80 0.88  CALCIUM 9.8   < > 9.1 9.2 8.9  MG  --   --  2.3  --   --   PROT 7.9  --   --   --   --   ALBUMIN 4.4  --   --   --   --   AST 19  --   --   --   --   ALT 11  --   --   --   --   ALKPHOS 125  --   --   --   --   BILITOT 0.9  --   --   --   --   GFRNONAA >60   < > >60 >60 >60  ANIONGAP 13   < >  $'10 9 7   'Z$ < > = values in this interval not displayed.    Lipids No results for input(s): CHOL, TRIG, HDL, LABVLDL, LDLCALC, CHOLHDL in the last 168 hours.  Hematology Recent Labs  Lab 07/29/21 0847 07/30/21 0512 07/31/21 0426  WBC 6.6 6.2 7.3  RBC 4.91 4.56 4.62  HGB 14.2 13.3 13.5  HCT 42.5 39.6 40.2  MCV 86.6 86.8 87.0  MCH 28.9 29.2 29.2  MCHC 33.4 33.6 33.6  RDW 13.4 13.2 13.1  PLT 358 341 343   Thyroid No results for input(s): TSH, FREET4 in the last 168 hours.  BNPNo results for input(s): BNP, PROBNP in the last 168 hours.  DDimer No results for input(s): DDIMER in the last 168 hours.   Radiology    No results found.  Cardiac Studies   Echo 07/2020 EF 55-60%  Patient Profile     61 y.o. female with history of A-fib/a flutter s/p ablation 07/2020, hypertension, anxiety presenting with nausea, vomiting, diarrhea being seen for atrial flutter with rapid ventricular response  Assessment & Plan    Atrial flutter with RVR -P.o. Cardizem 30 mg every 6, continue Lopressor 12.5 mg every 6 hours -Continue Eliquis.  Millimeters dose of Eliquis. -Plan DC cardioversion tomorrow. -Keep n.p.o. midnight. -Consider antiarrhythmic after cardioversion such as flecainide.  Close follow-up with EP.  2.  Hypertension -BP controlled -Continue Cardizem, Lopressor as above   3.  Nausea, diarrhea,  -Nausea better. -PTA  [place] -Management as per medicine team  Total encounter time more than 50 minutes  Greater than 50% was spent in counseling and coordination of care with the patient   Shared Decision Making/Informed Consent The risks (stroke, cardiac arrhythmias rarely resulting in the need for a temporary or permanent pacemaker, skin irritation or burns and complications associated with conscious sedation including aspiration, arrhythmia, respiratory failure and death), benefits (restoration of normal sinus rhythm) and alternatives of a direct current cardioversion were explained in detail to Ms. Car and she agrees to proceed.         Signed, Kate Sable, MD  07/31/2021, 12:03 PM

## 2021-07-31 NOTE — Plan of Care (Signed)

## 2021-07-31 NOTE — Progress Notes (Signed)
*  PRELIMINARY RESULTS* Echocardiogram 2D Echocardiogram has been performed.  [name] 07/31/2021, 12:36 PM

## 2021-08-01 ENCOUNTER — Encounter
Admission: EM | Disposition: A | Discharge status: Home / Self Care | Payer: Self-pay | Source: Home / Self Care | Attending: Internal Medicine

## 2021-08-01 ENCOUNTER — Inpatient Hospital Stay: Payer: Medicare Other | Admitting: Anesthesiology

## 2021-08-01 ENCOUNTER — Telehealth: Payer: Self-pay | Admitting: *Deleted

## 2021-08-01 DIAGNOSIS — I4892 Unspecified atrial flutter: Secondary | ICD-10-CM

## 2021-08-01 DIAGNOSIS — I4819 Other persistent atrial fibrillation: Principal | ICD-10-CM

## 2021-08-01 DIAGNOSIS — K8689 Other specified diseases of pancreas: Secondary | ICD-10-CM

## 2021-08-01 HISTORY — PX: CARDIOVERSION: SHX1299

## 2021-08-01 LAB — BASIC METABOLIC PANEL
Anion gap: 8 (ref 5–15)
BUN: 21 mg/dL (ref 8–23)
CO2: 24 mmol/L (ref 22–32)
Calcium: 9.2 mg/dL (ref 8.9–10.3)
Chloride: 108 mmol/L (ref 98–111)
Creatinine, Ser: 0.92 mg/dL (ref 0.44–1.00)
GFR, Estimated: >60 mL/min (ref >60)
Glucose, Bld: 125 mg/dL — ABNORMAL HIGH (ref 70–99)
Potassium: 3.9 mmol/L (ref 3.5–5.1)
Sodium: 140 mmol/L (ref 135–145)

## 2021-08-01 LAB — CBC
HCT: 40.6 % (ref 36.0–46.0)
Hemoglobin: 13.8 g/dL (ref 12.0–15.0)
MCH: 29.4 pg (ref 26.0–34.0)
MCHC: 34 g/dL (ref 30.0–36.0)
MCV: 86.6 fL (ref 80.0–100.0)
Platelets: 359 10*3/uL (ref 150–400)
RBC: 4.69 MIL/uL (ref 3.87–5.11)
RDW: 13 % (ref 11.5–15.5)
WBC: 7.4 10*3/uL (ref 4.0–10.5)
nRBC: 0 % (ref 0.0–0.2)

## 2021-08-01 SURGERY — CARDIOVERSION
Anesthesia: General

## 2021-08-01 MED ORDER — DILTIAZEM HCL ER COATED BEADS 120 MG PO CP24
120.0000 mg | ORAL_CAPSULE | Freq: Every day | ORAL | Status: DC
  Administered 2021-08-01: 120 mg via ORAL
  Filled 2021-08-01: qty 1

## 2021-08-01 MED ORDER — PROPOFOL 10 MG/ML IV BOLUS
INTRAVENOUS | Status: DC | PRN
  Administered 2021-08-01: 50 mg via INTRAVENOUS

## 2021-08-01 MED ORDER — FLECAINIDE ACETATE 50 MG PO TABS
50.0000 mg | ORAL_TABLET | Freq: Two times a day (BID) | ORAL | Status: DC
Start: 2021-08-01 — End: 2021-08-01
  Administered 2021-08-01: 50 mg via ORAL
  Filled 2021-08-01: qty 1

## 2021-08-01 MED ORDER — FLECAINIDE ACETATE 50 MG PO TABS: 50.0000 mg | ORAL_TABLET | Freq: Two times a day (BID) | ORAL | 0 refills | Status: DC

## 2021-08-01 MED ORDER — PROPOFOL 10 MG/ML IV BOLUS
INTRAVENOUS | Status: AC
  Filled 2021-08-01: qty 40

## 2021-08-01 MED ORDER — SODIUM CHLORIDE 0.9 % IV SOLN: INTRAVENOUS | Status: DC

## 2021-08-01 NOTE — Anesthesia Preprocedure Evaluation (Signed)
Anesthesia Evaluation  Patient identified by MRN, date of birth, ID band Patient awake    Reviewed: Allergy & Precautions, NPO status , Patient's Chart, lab work & pertinent test results  History of Anesthesia Complications (+) PONV and history of anesthetic complications  Airway Mallampati: III  TM Distance: <3 FB Neck ROM: full    Dental  (+) Chipped   Pulmonary asthma , sleep apnea ,    Pulmonary exam normal        Cardiovascular hypertension, (-) angina+ dysrhythmias Atrial Fibrillation  Rhythm:irregular Rate:Normal     Neuro/Psych  Headaches, PSYCHIATRIC DISORDERS  Neuromuscular disease    GI/Hepatic Neg liver ROS, PUD, GERD  Controlled,  Endo/Other  negative endocrine ROS  Renal/GU Renal disease  negative genitourinary   Musculoskeletal   Abdominal   Peds  Hematology negative hematology ROS (+)   Anesthesia Other Findings Past Medical History: No date: Abnormal EKG     Comment:  HX OF INVERTED T WAVES ON EKG, PALPITATIONS, CHEST               PAINS-CARDIAC WORK UP DID NOT SHOW ANY HEART DISEASE No date: AC (acromioclavicular) joint bone spurs 01/04/2017: Acute postoperative pain 08/15/2004: [place] anemia No date: Anemia     Comment:  Iron Infusion-8 yrs ago No date: Anxiety No date: Asthma 08/18/2014: Cephalalgia 08/18/2014: Cervical disc disease     Comment:  Needs neck surgery.  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 05/2019 Cath: Nl cors. EF 55-65%. No date: History of cardioversion 01/04/2015: History of cervical spinal surgery No date: History of kidney stones No date: Hypertension No date: Inverted T wave 02/01/2021: Iron deficiency No date: Mitral regurgitation     Comment:  a. 07/2020 Echo: EF 60-65%, no rwma. Nl RV size/fxn. RVSP              39.61mHg. Mildly to mod dil LA. Mod MR; b. 07/2020 TEE: EF              55-60%. Lambl's excresence. Nl RV size/fxn. Midly dil LA.              No LA/LAA thrombus. Mild MR. 08/18/2014: Narrowing of intervertebral disc space     Comment:  Currently on disability.  04/22/2013: Orthostatic hypotension No date: Pain     Comment:  CHRONIC NECK AND BACK PAIN - LIMITED ROM NECK - S/P               FUSIONS CERVICAL AND LUMBAR 2021: Paroxysmal atrial fibrillation ([facility]     Comment:  a. 07/2020 s/p PVI. No date: Pneumonia No date: PONV (postoperative nausea and vomiting)     Comment:  PT GIVES  HX OF N&V AND FEVER WITH SURGERIES YEARS               AGO--BUT NO PROBLEMS WITH MORE RECENT SURGERIES--STATES               NOT MALIGNANT HYPERTHERMIA 05/14/2012: Postop Hyponatremia 05/14/2012: Postoperative anemia due to acute blood loss No date: PTSD (post-traumatic stress disorder) No date: Right foot drop 08/18/2014: Right hip arthralgia     Comment:  Status post surgery of right, and now needs left.  2021: Sleep apnea     Comment:  does not have a cpap No date: Therapeutic opioid-induced constipation (OIC) No date: Typical atrial flutter [facility])  Past Surgical History: No date: ABDOMINAL HYSTERECTOMY 05/2019: afib 10/30/2012: ANTERIOR CERVICAL DECOMP/DISCECTOMY FUSION; N/A     Comment:  Procedure: ACDF C5-6, EXPLORATION AND HARDWARE REMOVAL                C6-7;  Surgeon: Melina Schools, MD;  Location: [place];                Service: Orthopedics;  Laterality: N/A; MAY 2012: ANTERIOR FUSION CERVICAL SPINE     Comment:  AT [facility] 08/06/2020: ATRIAL FIBRILLATION ABLATION; N/A     Comment:  Procedure: ATRIAL FIBRILLATION ABLATION;  Surgeon:               Vickie Epley, MD;  Location: [place] CV LAB;                Service: Cardiovascular;  Laterality: N/A; 2009: BACK SURGERY     Comment:  LUMBAR FUSION WITH RODS  2008: BREAST BIOPSY; Right     Comment:  benign.- bx/clip 1998: BREAST EXCISIONAL BIOPSY; Left     Comment:  benign 06/2016: BREAST REDUCTION SURGERY; Bilateral No date: CARDIAC ELECTROPHYSIOLOGY MAPPING AND ABLATION 12/03/2019: CARDIOVERSION; N/A     Comment:  Procedure: CARDIOVERSION;  Surgeon: Kate Sable,               MD;  Location: [facility];  Service: Cardiovascular;                Laterality: N/A; 05-06-12: CARPAL TUNNEL RELEASE     Comment:  Right No date: CHOLECYSTECTOMY 01/26/2017: COLONOSCOPY WITH PROPOFOL; N/A     Comment:  Procedure: COLONOSCOPY WITH PROPOFOL;  Surgeon: Lucilla Lame, MD;  Location: [place];  Service:               Endoscopy;  Laterality: N/A; 01/16/2021: COLONOSCOPY WITH PROPOFOL; N/A     Comment:  Procedure: COLONOSCOPY WITH PROPOFOL;  Surgeon: Lin Landsman, MD;  Location: [facility];  Service:               Gastroenterology;  Laterality: N/A; No date: DIAGNOSTIC LAPAROSCOPIES - MULTIPLE FOR ENDOMETRIOSIS 01/26/2017: ESOPHAGEAL DILATION; N/A     Comment:  Procedure: ESOPHAGEAL DILATION;  Surgeon: Lucilla Lame,               MD;  Location: [place];  Service: Endoscopy;               Laterality: N/A; 05/30/2017: ESOPHAGEAL MANOMETRY; N/A     Comment:  Procedure: ESOPHAGEAL MANOMETRY (EM);  Surgeon: Lucilla Lame, MD;  Location: [facility];  Service:               Endoscopy;  Laterality:  N/A; 01/26/2017: ESOPHAGOGASTRODUODENOSCOPY (EGD) WITH PROPOFOL; N/A     Comment:  Procedure: ESOPHAGOGASTRODUODENOSCOPY (EGD) WITH               PROPOFOL;  Surgeon: Lucilla Lame, MD;  Location: [place];  Service: Endoscopy;  Laterality: N/A; 01/30/2020: ESOPHAGOGASTRODUODENOSCOPY (EGD) WITH PROPOFOL; N/A     Comment:  Procedure: ESOPHAGOGASTRODUODENOSCOPY (EGD) WITH               PROPOFOL;  Surgeon: Lucilla Lame, MD;  Location: [place];  Service: Endoscopy;  Laterality: N/A;                sleep apnea COVID + 01-15-20 01/15/2021: ESOPHAGOGASTRODUODENOSCOPY (EGD) WITH PROPOFOL; N/A     Comment:  Procedure: ESOPHAGOGASTRODUODENOSCOPY (EGD) WITH               PROPOFOL;  Surgeon: Lin Landsman, MD;  Location:               [place];  Service: Gastroenterology;  Laterality:               N/A; 09/20/2011: HIP ARTHROSCOPY     Comment:  Procedure: ARTHROSCOPY HIP;  Surgeon: Gearlean Alf,               MD;  Location: [facility];  Service: Orthopedics;                Laterality: Right;  Right Hip Scope with Labral               Debridement 06/10/2019: LEFT HEART CATH AND CORONARY ANGIOGRAPHY; Left     Comment:  Procedure: LEFT HEART CATH AND CORONARY ANGIOGRAPHY;                Surgeon: Nelva Bush, MD;  Location: [place]               CV LAB;  Service: Cardiovascular;  Laterality: Left; MARCH 2013: NASAL SEPTUM SURGERY     Comment:  IN [place] 05/30/2017: PH IMPEDANCE STUDY; N/A     Comment:  Procedure: [place] IMPEDANCE STUDY;  Surgeon: Lucilla Lame,               MD;  Location: [facility];  Service: Endoscopy;                Laterality: N/A; 01/26/2017: POLYPECTOMY; N/A     Comment:  Procedure: POLYPECTOMY;  Surgeon: Lucilla Lame, MD;                Location: [place];  Service: Endoscopy;                Laterality: N/A; 04/16/2013: POSTERIOR CERVICAL FUSION/FORAMINOTOMY; N/A     Comment:  Procedure:  REMOVAL CERVICAL PLATES AND INTERBODY               CAGE/POSTERIOR CERVICAL SPINAL FUSION C4 - C6/C5               CORPECTOMY/C4 - C6 FUSION WITH ILIAC CREST BONE GRAFT;                Surgeon: Melina Schools, MD;  Location: [place];  Service:  Orthopedics;  Laterality: N/A; No date: RADIOFREQUENCY ABLATION NERVES 05/2016: REDUCTION MAMMAPLASTY; Bilateral ABOUT 2010: RIGHT HIP ARTHROSCOPY FOR LABRAL TEAR     Comment:  2012 also 05-06-12: SHOULDER ARTHROSCOPY     Comment:  bone spur 08/05/2020: TEE WITHOUT CARDIOVERSION; N/A     Comment:  Procedure: TRANSESOPHAGEAL ECHOCARDIOGRAM (TEE);                Surgeon: Lelon Perla, MD;  Location: [facility];               Service: Cardiovascular;  Laterality: N/A; No date: TONSILLECTOMY     Comment:  AS A CHILD 05/13/2012: TOTAL HIP ARTHROPLASTY; Right     Comment:  Procedure: TOTAL HIP ARTHROPLASTY ANTERIOR APPROACH;                Surgeon: Gearlean Alf, MD;  Location: [facility];                Service: Orthopedics;  Laterality: Right;  BMI    Body Mass Index: 26.43 kg/m      Reproductive/Obstetrics negative OB ROS                             Anesthesia Physical Anesthesia Plan  ASA: 3  Anesthesia Plan: General   Post-op Pain Management:    Induction: Intravenous  PONV Risk Score and Plan: Propofol infusion and TIVA  Airway Management Planned: Natural Airway and Nasal Cannula  Additional Equipment:   Intra-op Plan:   Post-operative Plan:   Informed Consent: I have reviewed the patients History and Physical, chart, labs and discussed the procedure including the risks, benefits and alternatives for the proposed anesthesia with the patient or authorized representative who has indicated his/her understanding and acceptance.     Dental Advisory Given  Plan Discussed with: Anesthesiologist, CRNA and Surgeon  Anesthesia Plan Comments: (Patient consented for risks of anesthesia including  but not limited to:  - adverse reactions to medications - risk of airway placement if required - damage to eyes, teeth, lips or other oral mucosa - nerve damage due to positioning  - sore throat or hoarseness - Damage to heart, brain, nerves, lungs, other parts of body or loss of life  Patient voiced understanding.)        Anesthesia Quick Evaluation

## 2021-08-01 NOTE — Plan of Care (Signed)
  RD consulted for nutrition education regarding diabetes.   Lab Results  Component Value Date   HGBA1C 5.5 01/19/2021   Case discussed with MD, who reports pt is requesting RD consult on carb modified diet. Informed MD that pt has no history of DM- MD reports pt is requesting to speak with RD and unsure why she is on carb modified diet.   Spoke with pt at bedside, who reports feeling better after cardioversion. Pt shares that her GI doctor diagnosed her with pancreatic insufficiency and was told to eat "no carb, no sugar, and no pork". Pt reports concern that there is very little she can eat on these diet restrictions. She has eliminated soda, sweet tea, and sugary beverages; recently purchased the [employer] water bottle, which she enjoys.   RD spent most visit reviewing current diet and discussing ways to decrease simple sugars and increase complex carbohydrates in diet. Discussed overall general healthy food choices.   RD provided "Plate Method" handout from the Academy of [employer]. Discussed different food groups and their effects on blood sugar, emphasizing carbohydrate-containing foods. Provided list of carbohydrates and recommended serving sizes of common foods.  Discussed importance of controlled and consistent carbohydrate intake throughout the day. Provided examples of ways to balance meals/snacks and encouraged intake of high-fiber, whole grain complex carbohydrates. Teach back method used.  Expect fair to good compliance.  Current diet order is 2 gram sodium, patient is consuming approximately 100% of meals at this time. Labs and medications reviewed. No further nutrition interventions warranted at this time. RD contact information provided. If additional nutrition issues arise, please re-consult RD.  [name], RD, LDN, [place] Registered Dietitian II Certified Diabetes Care and Education Specialist Please refer to [facility] for RD and/or RD on-call/weekend/after hours pager

## 2021-08-01 NOTE — Discharge Instructions (Signed)

## 2021-08-01 NOTE — Procedures (Signed)
Cardioversion procedure note For atrial flutter  Procedure Details:  Consent: Risks of procedure as well as the alternatives and risks of each were explained to the (patient/caregiver).  Consent for procedure obtained.  Time Out: Verified patient identification, verified procedure, site/side was marked, verified correct patient position, special equipment/implants available, medications/allergies/relevent history reviewed, required imaging and test results available.  Performed  Patient placed on cardiac monitor, pulse oximetry, supplemental oxygen as necessary.   Sedation given: propofol IV per anesthesia team Pacer pads placed anterior and posterior chest.   Cardioverted 1 time(s).   Cardioverted at  150J. Synchronized biphasic Converted to NSR   Evaluation: Findings: Post procedure EKG shows: NSR Complications: None Patient did tolerate procedure well.  Recommendation: Patient transitioned to cardizem '120mg'$  daily. She has very symptomatic atrial flutter/fib.  Flecainide '50mg'$  bid started. ([place] 2021 with no CAD) Outpatient follow with EP upon discharge  Time Spent Directly with the Patient:  35 minutes   Signed, Kate Sable, M.D. 08/01/21 [address], [place]

## 2021-08-01 NOTE — Telephone Encounter (Signed)
-----   Message from [place], PA-C sent at 08/01/2021 10:29 AM EDT ----- Regarding: hosp f/u Patient needs closest f/u with EP

## 2021-08-01 NOTE — Progress Notes (Signed)
Progress Note  Patient Name: [name] Date of Encounter: 08/01/2021  [facility] Cardiologist: Kate Sable, MD   Subjective   S/p successful cardioversion. Patient remains in NSR, now on flecainide. Diarrhea this AM.   Inpatient Medications    Scheduled Meds:  ALPRAZolam  1 mg Oral QHS   apixaban  5 mg Oral BID   busPIRone  15 mg Oral BID   diltiazem  120 mg Oral Daily   flecainide  50 mg Oral Q12H   lipase/protease/amylase  24,000 Units Oral TID WC   montelukast  10 mg Oral QHS   pantoprazole  40 mg Oral BID   Continuous Infusions:  PRN Meds: acetaminophen **OR** acetaminophen, lipase/protease/amylase **AND** lipase/protease/amylase, morphine, ondansetron (ZOFRAN) IV, sodium chloride   Vital Signs    Vitals:   08/01/21 0746 08/01/21 0756 08/01/21 0806 08/01/21 0816  BP: (!) 88/67 94/63 104/72 100/75  Pulse: (!) 56 (!) 55  (!) 57  Resp: 11 11  (!) 8  Temp:      TempSrc:      SpO2: 100% 100%  99%  Weight:        Intake/Output Summary (Last 24 hours) at 08/01/2021 1027 Last data filed at 07/31/2021 1821 Gross per 24 hour  Intake 720 ml  Output --  Net 720 ml      07/27/2021    2:02 PM 07/14/2021    1:30 PM 06/13/2021    9:58 AM  Last 3 Weights  Weight (lbs) 154 lb 154 lb 2 oz 150 lb  Weight (kg) 69.854 kg 69.911 kg 68.04 kg      Telemetry    Aflutter>NSR, HR 60s - Personally Reviewed  ECG    SB 52bpm - Personally Reviewed  Physical Exam   GEN: No acute distress.   Neck: No JVD Cardiac: RRR, no murmurs, rubs, or gallops.  Respiratory: Clear to auscultation bilaterally. GI: Soft, nontender, non-distended  MS: No edema; No deformity. Neuro:  Nonfocal  Psych: Normal affect   Labs    High Sensitivity Troponin:   Recent Labs  Lab 07/27/21 1405 07/27/21 1741  TROPONINIHS 9 9     Chemistry Recent Labs  Lab 07/27/21 1405 07/28/21 0731 07/29/21 0847 07/30/21 0512 07/31/21 0426 08/01/21 0153  NA 139   < > 139 139 138 140   K 3.7   < > 3.4* 4.0 3.9 3.9  CL 104   < > 105 106 108 108  CO2 22   < > '24 24 23 24  '$ GLUCOSE 140*   < > 97 106* 109* 125*  BUN 12   < > '9 11 16 21  '$ CREATININE 0.95   < > 0.80 0.80 0.88 0.92  CALCIUM 9.8   < > 9.1 9.2 8.9 9.2  MG  --   --  2.3  --   --   --   PROT 7.9  --   --   --   --   --   ALBUMIN 4.4  --   --   --   --   --   AST 19  --   --   --   --   --   ALT 11  --   --   --   --   --   ALKPHOS 125  --   --   --   --   --   BILITOT 0.9  --   --   --   --   --  GFRNONAA >60   < > >60 >60 >60 >60  ANIONGAP 13   < > '10 9 7 8   '$ < > = values in this interval not displayed.    Lipids No results for input(s): CHOL, TRIG, HDL, LABVLDL, LDLCALC, CHOLHDL in the last 168 hours.  Hematology Recent Labs  Lab 07/30/21 0512 07/31/21 0426 08/01/21 0153  WBC 6.2 7.3 7.4  RBC 4.56 4.62 4.69  HGB 13.3 13.5 13.8  HCT 39.6 40.2 40.6  MCV 86.8 87.0 86.6  MCH 29.2 29.2 29.4  MCHC 33.6 33.6 34.0  RDW 13.2 13.1 13.0  PLT 341 343 359   Thyroid No results for input(s): TSH, FREET4 in the last 168 hours.  BNPNo results for input(s): BNP, PROBNP in the last 168 hours.  DDimer No results for input(s): DDIMER in the last 168 hours.   Radiology    ECHOCARDIOGRAM COMPLETE  Result Date: 07/31/2021    ECHOCARDIOGRAM REPORT   Patient Name:   BIRGIT NOWLING Date of Exam: 07/31/2021 Medical Rec #:  [id number]        Height:       64.0 in Accession #:    [id number]       Weight:       154.0 lb Date of Birth:  October 22, 1960        BSA:          1.751 m Patient Age:    61 years         BP:           112/86 mmHg Patient Gender: F                HR:           74 bpm. Exam Location:  [facility] Procedure: 2D Echo Indications:     Atrial Fibrillation I48.91  History:         Patient has prior history of Echocardiogram examinations, most                  recent 08/05/2020.  Sonographer:     Kathlen Brunswick RDCS Referring Phys:  [id number] Kate Sable Diagnosing Phys: Kate Sable MD IMPRESSIONS  1. Left  ventricular ejection fraction, by estimation, is 55 to 60%. Left ventricular ejection fraction by 2D MOD biplane is 55.3 %. The left ventricle has normal function. The left ventricle has no regional wall motion abnormalities. There is mild left ventricular hypertrophy. Left ventricular diastolic parameters are indeterminate.  2. Right ventricular systolic function is normal. The right ventricular size is normal.  3. The mitral valve is normal in structure. Trivial mitral valve regurgitation.  4. The aortic valve is tricuspid. Aortic valve regurgitation is trivial. FINDINGS  Left Ventricle: Left ventricular ejection fraction, by estimation, is 55 to 60%. Left ventricular ejection fraction by 2D MOD biplane is 55.3 %. The left ventricle has normal function. The left ventricle has no regional wall motion abnormalities. The left ventricular internal cavity size was normal in size. There is mild left ventricular hypertrophy. Left ventricular diastolic parameters are indeterminate. Right Ventricle: The right ventricular size is normal. No increase in right ventricular wall thickness. Right ventricular systolic function is normal. Left Atrium: Left atrial size was normal in size. Right Atrium: Right atrial size was normal in size. Pericardium: There is no evidence of pericardial effusion. Mitral Valve: The mitral valve is normal in structure. Trivial mitral valve regurgitation. Tricuspid Valve: The tricuspid valve is normal in structure. Tricuspid valve regurgitation is mild.  Aortic Valve: The aortic valve is tricuspid. Aortic valve regurgitation is trivial. Aortic valve peak gradient measures 2.7 mmHg. Pulmonic Valve: The pulmonic valve was not well visualized. Pulmonic valve regurgitation is not visualized. Aorta: The aortic root and ascending aorta are structurally normal, with no evidence of dilitation. Venous: The inferior vena cava was not well visualized. IAS/Shunts: No atrial level shunt detected by color flow  Doppler.  LEFT VENTRICLE PLAX 2D                        Biplane EF (MOD) LVIDd:         4.00 cm         LV Biplane EF:   Left LVIDs:         2.60 cm                          ventricular LV PW:         1.10 cm                          ejection LV IVS:        1.20 cm                          fraction by LVOT diam:     1.90 cm                          2D MOD LV SV:         45                               biplane is LV SV Index:   26                               55.3 %. LVOT Area:     2.84 cm                                Diastology                                LV e' medial:    6.31 cm/s LV Volumes (MOD)               LV E/e' medial:  9.5 LV vol d, MOD    52.8 ml       LV e' lateral:   7.07 cm/s A2C:                           LV E/e' lateral: 8.5 LV vol d, MOD    36.2 ml A4C: LV vol s, MOD    24.0 ml A2C: LV vol s, MOD    14.0 ml A4C: LV SV MOD A2C:   28.8 ml LV SV MOD A4C:   36.2 ml LV SV MOD BP:    24.1 ml RIGHT VENTRICLE RV Basal diam:  3.40 cm RV S prime:     9.25 cm/s TAPSE (M-mode): 1.6 cm LEFT ATRIUM             Index  RIGHT ATRIUM           Index LA diam:        3.90 cm 2.23 cm/m   RA Area:     14.00 cm LA Vol (A2C):   48.1 ml 27.47 ml/m  RA Volume:   34.00 ml  19.42 ml/m LA Vol (A4C):   40.0 ml 22.85 ml/m LA Biplane Vol: 45.8 ml 26.16 ml/m  AORTIC VALVE                 PULMONIC VALVE AV Area (Vmax): 3.02 cm     PV Vmax:       0.71 m/s AV Vmax:        81.40 cm/s   PV Peak grad:  2.0 mmHg AV Peak Grad:   2.7 mmHg LVOT Vmax:      86.80 cm/s LVOT Vmean:     57.500 cm/s LVOT VTI:       0.158 m  AORTA Ao Root diam: 3.20 cm Ao Asc diam:  3.20 cm MITRAL VALVE               TRICUSPID VALVE MV Area (PHT): 4.19 cm    TV Peak grad:   10.6 mmHg MV Decel Time: 181 msec    TV Vmax:        1.62 m/s MV E velocity: 60.00 cm/s                            SHUNTS                            Systemic VTI:  0.16 m                            Systemic Diam: 1.90 cm Kate Sable MD Electronically signed by Kate Sable MD Signature Date/Time: 07/31/2021/1:19:13 PM    Final     Cardiac Studies     Echo TEE 08/05/20 PROCEDURE: After discussion of the risks and benefits of a TEE, an  informed consent was obtained from the patient. The transesophogeal probe  was passed without difficulty through the esophogus of the patient. Imaged  were obtained with the patient in a  left lateral decubitus position. Local oropharyngeal anesthetic was  provided with Cetacaine. Sedation performed by different physician. The  patient was monitored while under deep sedation. Anesthestetic sedation  was provided intravenously by  Anesthesiology: '230mg'$  of Propofol. The patient developed no complications  during the procedure.   IMPRESSIONS    1. 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   2. Left ventricular ejection fraction, by estimation, is 55 to 60%. The  left ventricle has normal function.   3. Right ventricular systolic function is normal. The right ventricular  size is normal.   4. Left atrial size was mildly dilated. No left atrial/left atrial  appendage thrombus was detected.   5. The mitral valve is normal in structure. Mild mitral valve  regurgitation.   6. The aortic valve is tricuspid. Aortic valve regurgitation is trivial.   7. There is mild (Grade II) plaque involving the descending aorta.    Echo 07/22/20  1. Left ventricular ejection fraction, by estimation, is 60 to 65%. The  left ventricle has normal function. The left ventricle has no regional  wall motion abnormalities. Left ventricular diastolic parameters are  indeterminate.  2. Right ventricular systolic function is normal. The right ventricular  size is normal. There is mildly elevated pulmonary artery systolic  pressure. The estimated right ventricular systolic pressure is 40.9 mmHg.   3. Left atrial size was mild to moderately dilated.   4. Unable to exclude mild mitral valve prolapse of the  posterior leaflet.  Moderate mitral valve regurgitation. No evidence of mitral stenosis.    [facility] 05/2019 Conclusion   Conclusions: No angiographically significant coronary artery disease. Normal left ventricular contraction with mildly elevated filling pressure, suggestive of diastolic heart failure.   Recommendations: Primary prevention of coronary artery disease. Initiate furosemide 40 mg p.o. every other day, as ordered by Dr. Caryl Comes earlier today. If no evidence of bleeding or vascular injury at right radial catheterization site, apixaban can be restarted tomorrow. Follow-up with Dr. Garen Lah or APP in ~2 weeks.   Nelva Bush, MD [facility]  Patient Profile     61 y.o. female with a hx of afib/flutter s/p ablation 08/06/20 on Eliquis, HTN, asthma, anxiety, GERD OSAwho is being seen 07/29/2021 for the evaluation of Afib RVR   Assessment & Plan    Afib RVR H/o persistent Afib - h/o afib/flutter with failed cardioversion and prior ablation 07/2020 followed by EP. H/o sinus bradycardia on amiodarone. Historically very symptomatic in afib. - Afib RVR rates up to the 160s in the setting of severe N/V/D  - she did not tolerate IV dilt due to hypotension - PTA diltiazem '120mg'$  daily - s/p successful cardioversion  - started on flecainide '50mg'$  BID this AM - remains in NSR - continue Eliquis '5mg'$  BID - Needs to see EP as OP.    Nausea/vomiting/diarrhea - improving - tx per IM    For questions or updates, please contact [place] Please consult [email or web address] for contact info under        Signed, Navada Osterhout Ninfa Meeker, PA-C  08/01/2021, 10:27 AM

## 2021-08-01 NOTE — Discharge Summary (Signed)
Physician Discharge Summary  [name] YNW:[id number] DOB: 02/14/1961 DOA: 07/27/2021  PCP: Virginia Crews, MD  Admit date: 07/27/2021 Discharge date: 08/01/2021  Admitted From: home  Disposition: home   Recommendations for Outpatient Follow-up:  Follow up with PCP in 1-2 weeks F/u w/ cardio, Dr. Garen Lah, in 1-2 weeks   Home Health: no  Equipment/Devices:  Discharge Condition: stable  CODE STATUS: full  Diet recommendation: Heart Healthy   Brief/Interim Summary: HPI was taken from Dr. Tobie Poet: [name] is a 61 year old female with paroxysmal atrial fibrillation and atrial flutter status post ablation on 08/06/2020 currently on Eliquis 5 mg p.o. twice daily, depression, anxiety, muscle spasms, GERD, hypertension, who presents emergency department for chief concerns of intractable nausea and vomiting and diarrhea for 6 days.   In the ED, serum sodium 139, potassium 3.7, chloride 104, bicarb 22, BUN of 12, serum creatinine 0.95, GFR greater than 60, nonfasting blood glucose 140, WBC 8.5, hemoglobin 15.9, platelets of 467.  High-sensitivity troponin was 9 and on repeat was also 9.  ED treatment: Diltiazem 10 mg IV one-time dose, diltiazem 50 mg p.o.  Initially diltiazem GGT was ordered and discontinued by ED provider.  LR 1 L bolus, x2 have been ordered.   At bedside, she is able to tell me her name, age, and she knows she is in the hospital.    She comes into the hospital for chief concerns of diarrhea x 6 days with increased weakness. She reports she felt that she was going into atrial fibrillation.  She reports she has been taking her home diltiazem as needed for heart rate greater than 120 for the last few days.  She endorses that she feels when she is in A-fib with RVR.  She reports the diarrhea is black and foamy.  She denies bright red blood.  She reports his recent iron infusion outpatient for iron deficiency anemia.   She reports that she does not vomit due  to history of nissen fundoplication procedure. She has been having increase dry heaves.    She reports that she usually has constipation and usually has to take miralax for her to have a bowel movement. She reports she has not taken linzess or miralax in at least 5 days.   She endorses subjective fever at home, with a t max of 99.9 and diaphoresis and then chills.    She denies cough. She endorses chest pain whenever she feels herself go into atrial fibrillation.    She endorses compliance with blood pressure medications. She reports she did not call her GI provider regarding diarrhea, instead called her pcp and was advised to come to the ED. She denies recent abx use.   She denies dysuria, hematuria. She reports black stool, foamy, liquid.    Social history: She lives with her husband and grandchildren. She denies tobacco use, etoh, recreational drug use. 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    Vaccination history: She is vaccinated for covid 19 and influenza.      Hospital course as per Dr. Jimmye Norman 5/18-5/22/23: Pt presented w/ intractable nausea, vomiting & diarrhea. Pt was treated w/ supportive care w/ IVFs & zofran prn. Of note, pt does have hx of pancreatic insufficiency. C. Diff, GI PCR panel were both neg.  CT abd/pelvis shows no acute abd or pelvic pathology. Furthermore, pt had intermittent palpations secondary to PAF w/ RVR. Pt's was initially treated medically w/ po meds without much improvement so pt had cardioversion  on 08/01/21 that was successful in getting the pt back into NSR. Pt was d/c home w/ flecainide (started new inpatient) & continue on home dose of diltiazem & eliquis as per cardio. Pt will need to f/u outpatient w/ EP as per cardio. For more information, please see previous progress/consult notes.   Discharge Diagnoses:  Principal Problem:   Intractable nausea and vomiting Active Problems:   Cervical fusion syndrome   Chronic anticoagulation (Eliquis)    Anxiety state   Clinical depression   Colon, diverticulosis   OSA (obstructive sleep apnea)   Hypertension   Pure hypercholesterolemia   Chronic use of opiate for therapeutic purpose   Atrial fibrillation with RVR (HCC)   Diarrhea   Atrial fibrillation (HCC)   Atrial flutter (HCC)  PAF: w/ RVR. D/c IV diltiazem drip. Continue on flecainide, cardizem & eliquis. S/p cardioversion which was successful 08/01/21  Intractable nausea and vomiting: etiology unclear. Hx of pancreatic insufficiency. Decreased home dose of creon as per pt's request. CT abd/pelvis shows no acute abd or pelvic pathology. Zofran prn. Intermittent  Cervical fusion syndrome: continue on home dose of morphine    Diarrhea: etiology unclear, GI PCR panel & c. diff are both neg. Diarrhea resolved   HTN: continue on cardizem    Depression: severity unknown. Continue on home dose of buspirone   Discharge Instructions  Discharge Instructions     Diet - low sodium heart healthy   Complete by: As directed    Discharge instructions   Complete by: As directed    F/u w/ cardio, Dr. Garen Lah, in 1-2 weeks. F/u w/ PCP in 1-2 weeks   Increase activity slowly   Complete by: As directed       Allergies as of 08/01/2021       Reactions   Amoxicillin Hives   She did ok w ANCEF   Chlorhexidine Gluconate Dermatitis, Hives   Clindamycin Hives   Codeine Hives   Erythromycin Hives   "mycins" in general   Penicillin G Hives   "cillins" in general   Sulfa Antibiotics Nausea And Vomiting, Hives       Levofloxacin Hives   Shellfish Allergy Hives   Decadron [dexamethasone] Other (See Comments)   Hot flashes, insomnia, "manic" Hot flashes, insomnia, "manic"   Mangifera Indica Hives   papaya   Papaya Derivatives Hives   Betadine [povidone Iodine] Hives       Clarithromycin Hives   Other Hives    Mango   Povidone-iodine Hives   Prednisone Anxiety   High blood pressure, flushed, mood changes, heart  palpitations High blood pressure, flushed, mood changes, heart palpitations        Medication List     STOP taking these medications    cyclobenzaprine 10 MG tablet Commonly known as: FLEXERIL   dicyclomine 20 MG tablet Commonly known as: BENTYL       TAKE these medications    ALPRAZolam 1 MG tablet Commonly known as: XANAX Take 1 mg by mouth at bedtime.   apixaban 5 MG Tabs tablet Commonly known as: ELIQUIS Take 1 tablet (5 mg total) by mouth 2 (two) times daily.   BENGAY EX Apply 1 application topically daily as needed (pain).   busPIRone 15 MG tablet Commonly known as: BUSPAR Take 15 mg by mouth 2 (two) times daily.   Creon 36000 UNITS Cpep capsule Generic drug: lipase/protease/amylase Take 2 capsules with the first bite of each meal and 1 capsule with the first bite of each  snack   diltiazem 120 MG 24 hr capsule Commonly known as: CARDIZEM CD Take 1 capsule (120 mg total) by mouth daily.   diltiazem 30 MG tablet Commonly known as: CARDIZEM Take 1 tablet (30 mg total) by mouth every 6 (six) hours as needed.   flecainide 50 MG tablet Commonly known as: TAMBOCOR Take 1 tablet (50 mg total) by mouth every 12 (twelve) hours.   Linzess 145 MCG Caps capsule Generic drug: linaclotide TAKE 1 CAPSULE BY MOUTH DAILY BEFORE BREAKFAST. What changed: See the new instructions.   montelukast 10 MG tablet Commonly known as: SINGULAIR TAKE 1 TABLET BY MOUTH EVERY DAY   morphine 15 MG tablet Commonly known as: MSIR Take 1 tablet (15 mg total) by mouth every 6 (six) hours as needed for moderate pain or severe pain. Must last 30 days. What changed: Another medication with the same name was removed. Continue taking this medication, and follow the directions you see here.   pantoprazole 40 MG tablet Commonly known as: PROTONIX TAKE 1 TABLET BY MOUTH TWICE A DAY   polyethylene glycol 17 g packet Commonly known as: MIRALAX / GLYCOLAX Take 17 g by mouth daily as  needed.   sodium chloride 0.65 % Soln nasal spray Commonly known as: OCEAN Place 1 spray into both nostrils daily as needed for congestion (Dryness).   valACYclovir 1000 MG tablet Commonly known as: VALTREX TAKE TWO TABLETS BY MOUTH TWICE DAILY FOR ONE DAY FEVER BLISTER   Visine 0.025-0.3 % ophthalmic solution Generic drug: naphazoline-pheniramine 1 drop 4 (four) times daily as needed for eye irritation.        Allergies  Allergen Reactions   Amoxicillin Hives    She did ok w ANCEF   Chlorhexidine Gluconate Dermatitis and Hives   Clindamycin Hives   Codeine Hives   Erythromycin Hives    "mycins" in general   Penicillin G Hives    "cillins" in general   Sulfa Antibiotics Nausea And Vomiting and Hives        Levofloxacin Hives   Shellfish Allergy Hives   Decadron [Dexamethasone] Other (See Comments)    Hot flashes, insomnia, "manic" Hot flashes, insomnia, "manic"   Mangifera Indica Hives    papaya   Papaya Derivatives Hives   Betadine [Povidone Iodine] Hives        Clarithromycin Hives   Other Hives     Mango    Povidone-Iodine Hives   Prednisone Anxiety    High blood pressure, flushed, mood changes, heart palpitations High blood pressure, flushed, mood changes, heart palpitations    Consultations: cardio   Procedures/Studies: CT ABDOMEN PELVIS WO CONTRAST  Result Date: 07/28/2021 CLINICAL DATA:  Nausea, vomiting, diarrhea EXAM: CT ABDOMEN AND PELVIS WITHOUT CONTRAST TECHNIQUE: Multidetector CT imaging of the abdomen and pelvis was performed following the standard protocol without IV contrast. RADIATION DOSE REDUCTION: This exam was performed according to the departmental dose-optimization program which includes automated exposure control, adjustment of the mA and/or kV according to patient size and/or use of iterative reconstruction technique. COMPARISON:  05/01/2020 FINDINGS: Lower chest: No acute abnormality. Hepatobiliary: No focal liver abnormality is  seen. Prior cholecystectomy. Pancreas: Unremarkable. No pancreatic ductal dilatation or surrounding inflammatory changes. Spleen: Normal in size without focal abnormality. Adrenals/Urinary Tract: Adrenal glands are unremarkable. Kidneys are normal, without renal calculi, focal lesion, or hydronephrosis. Bladder is unremarkable. Stomach/Bowel: Stomach is within normal limits. No evidence of bowel wall thickening, distention, or inflammatory changes. Appendix is normal. Vascular/Lymphatic: Normal caliber abdominal aorta  with mild atherosclerosis. No lymphadenopathy. Reproductive: Status post hysterectomy. No adnexal masses. Other: No abdominal wall hernia or abnormality. No abdominopelvic ascites. Musculoskeletal: No acute osseous abnormality. No aggressive osseous lesion. Right total hip arthroplasty. Posterior spinal fusion from T10 through L4. IMPRESSION: 1. No acute abdominal or pelvic pathology. 2. Aortic Atherosclerosis (ICD10-I70.0). Electronically Signed   By: Kathreen Devoid M.D.   On: 07/28/2021 11:28   DG Chest Port 1 View  Result Date: 07/27/2021 CLINICAL DATA:  Nausea and vomiting and atrial fibrillation, initial encounter EXAM: PORTABLE CHEST 1 VIEW COMPARISON:  01/15/2021 FINDINGS: Cardiac shadow is stable. The lungs are well aerated bilaterally. No focal infiltrate or effusion is seen. Postsurgical changes are noted in the cervical and thoracolumbar spine stable from the prior study. No new focal abnormality is noted. IMPRESSION: No active disease. Electronically Signed   By: Inez Catalina M.D.   On: 07/27/2021 20:23   ECHOCARDIOGRAM COMPLETE  Result Date: 07/31/2021    ECHOCARDIOGRAM REPORT   Patient Name:   [name] Date of Exam: 07/31/2021 Medical Rec #:  [id number]        Height:       64.0 in Accession #:    [id number]       Weight:       154.0 lb Date of Birth:  1961-01-14        BSA:          1.751 m Patient Age:    22 years         BP:           112/86 mmHg Patient Gender: F                 HR:           74 bpm. Exam Location:  [facility] Procedure: 2D Echo Indications:     Atrial Fibrillation I48.91  History:         Patient has prior history of Echocardiogram examinations, most                  recent 08/05/2020.  Sonographer:     Kathlen Brunswick RDCS Referring Phys:  [id number] Kate Sable Diagnosing Phys: Kate Sable MD IMPRESSIONS  1. Left ventricular ejection fraction, by estimation, is 55 to 60%. Left ventricular ejection fraction by 2D MOD biplane is 55.3 %. The left ventricle has normal function. The left ventricle has no regional wall motion abnormalities. There is mild left ventricular hypertrophy. Left ventricular diastolic parameters are indeterminate.  2. Right ventricular systolic function is normal. The right ventricular size is normal.  3. The mitral valve is normal in structure. Trivial mitral valve regurgitation.  4. The aortic valve is tricuspid. Aortic valve regurgitation is trivial. FINDINGS  Left Ventricle: Left ventricular ejection fraction, by estimation, is 55 to 60%. Left ventricular ejection fraction by 2D MOD biplane is 55.3 %. The left ventricle has normal function. The left ventricle has no regional wall motion abnormalities. The left ventricular internal cavity size was normal in size. There is mild left ventricular hypertrophy. Left ventricular diastolic parameters are indeterminate. Right Ventricle: The right ventricular size is normal. No increase in right ventricular wall thickness. Right ventricular systolic function is normal. Left Atrium: Left atrial size was normal in size. Right Atrium: Right atrial size was normal in size. Pericardium: There is no evidence of pericardial effusion. Mitral Valve: The mitral valve is normal in structure. Trivial mitral valve regurgitation. Tricuspid Valve: The tricuspid valve is normal in structure.  Tricuspid valve regurgitation is mild. Aortic Valve: The aortic valve is tricuspid. Aortic valve regurgitation is trivial.  Aortic valve peak gradient measures 2.7 mmHg. Pulmonic Valve: The pulmonic valve was not well visualized. Pulmonic valve regurgitation is not visualized. Aorta: The aortic root and ascending aorta are structurally normal, with no evidence of dilitation. Venous: The inferior vena cava was not well visualized. IAS/Shunts: No atrial level shunt detected by color flow Doppler.  LEFT VENTRICLE PLAX 2D                        Biplane EF (MOD) LVIDd:         4.00 cm         LV Biplane EF:   Left LVIDs:         2.60 cm                          ventricular LV PW:         1.10 cm                          ejection LV IVS:        1.20 cm                          fraction by LVOT diam:     1.90 cm                          2D MOD LV SV:         45                               biplane is LV SV Index:   26                               55.3 %. LVOT Area:     2.84 cm                                Diastology                                LV e' medial:    6.31 cm/s LV Volumes (MOD)               LV E/e' medial:  9.5 LV vol d, MOD    52.8 ml       LV e' lateral:   7.07 cm/s A2C:                           LV E/e' lateral: 8.5 LV vol d, MOD    36.2 ml A4C: LV vol s, MOD    24.0 ml A2C: LV vol s, MOD    14.0 ml A4C: LV SV MOD A2C:   28.8 ml LV SV MOD A4C:   36.2 ml LV SV MOD BP:    24.1 ml RIGHT VENTRICLE RV Basal diam:  3.40 cm RV S prime:     9.25 cm/s TAPSE (M-mode): 1.6 cm LEFT ATRIUM  Index        RIGHT ATRIUM           Index LA diam:        3.90 cm 2.23 cm/m   RA Area:     14.00 cm LA Vol (A2C):   48.1 ml 27.47 ml/m  RA Volume:   34.00 ml  19.42 ml/m LA Vol (A4C):   40.0 ml 22.85 ml/m LA Biplane Vol: 45.8 ml 26.16 ml/m  AORTIC VALVE                 PULMONIC VALVE AV Area (Vmax): 3.02 cm     PV Vmax:       0.71 m/s AV Vmax:        81.40 cm/s   PV Peak grad:  2.0 mmHg AV Peak Grad:   2.7 mmHg LVOT Vmax:      86.80 cm/s LVOT Vmean:     57.500 cm/s LVOT VTI:       0.158 m  AORTA Ao Root diam: 3.20 cm Ao Asc diam:   3.20 cm MITRAL VALVE               TRICUSPID VALVE MV Area (PHT): 4.19 cm    TV Peak grad:   10.6 mmHg MV Decel Time: 181 msec    TV Vmax:        1.62 m/s MV E velocity: 60.00 cm/s                            SHUNTS                            Systemic VTI:  0.16 m                            Systemic Diam: 1.90 cm Kate Sable MD Electronically signed by Kate Sable MD Signature Date/Time: 07/31/2021/1:19:13 PM    Final    (Echo, Carotid, EGD, Colonoscopy, ERCP)    Subjective: Pt c/o fatigue   Discharge Exam: Vitals:   08/01/21 1203 08/01/21 1204  BP:  (!) 98/56  Pulse: 70 68  Resp:  16  Temp:  98.6 F (37 C)  SpO2:  98%   Vitals:   08/01/21 0900 08/01/21 1150 08/01/21 1203 08/01/21 1204  BP: 106/71   (!) 98/56  Pulse: (!) 58  70 68  Resp: 12   16  Temp: 98.2 F (36.8 C)   98.6 F (37 C)  TempSrc: Oral   Oral  SpO2: 98%   98%  Weight:  69.9 kg    Height:  5' 4.02" (1.626 m)      General: Pt is alert, awake, not in acute distress Cardiovascular: S1/S2 +, no rubs, no gallops Respiratory: CTA bilaterally, no wheezing, no rhonchi Abdominal: Soft, NT, ND, bowel sounds + Extremities: no edema, no cyanosis    The results of significant diagnostics from this hospitalization (including imaging, microbiology, ancillary and laboratory) are listed below for reference.     Microbiology: Recent Results (from the past 240 hour(s))  Resp Panel by RT-PCR (Flu A&B, Covid) Nasopharyngeal Swab     Status: None   Collection Time: 07/27/21  8:09 PM   Specimen: Nasopharyngeal Swab; Nasopharyngeal(NP) swabs in vial transport medium  Result Value Ref Range Status   SARS Coronavirus 2 by RT PCR NEGATIVE NEGATIVE Final  Comment: (NOTE) SARS-CoV-2 target nucleic acids are NOT DETECTED.  The SARS-CoV-2 RNA is generally detectable in upper respiratory specimens during the acute phase of infection. The lowest concentration of SARS-CoV-2 viral copies this assay can detect is 138  copies/mL. A negative result does not preclude SARS-Cov-2 infection and should not be used as the sole basis for treatment or other patient management decisions. A negative result may occur with  improper specimen collection/handling, submission of specimen other than nasopharyngeal swab, presence of viral mutation(s) within the areas targeted by this assay, and inadequate number of viral copies(<138 copies/mL). A negative result must be combined with clinical observations, patient history, and epidemiological information. The expected result is Negative.  Fact Sheet for Patients:  EntrepreneurPulse.com.au  Fact Sheet for Healthcare Providers:  IncredibleEmployment.be  This test is no t yet approved or cleared by the [place] FDA and  has been authorized for detection and/or diagnosis of SARS-CoV-2 by FDA under an Emergency Use Authorization (EUA). This EUA will remain  in effect (meaning this test can be used) for the duration of the COVID-19 declaration under Section 564(b)(1) of the Act, 21 U.S.C.section 360bbb-3(b)(1), unless the authorization is terminated  or revoked sooner.       Influenza A by PCR NEGATIVE NEGATIVE Final   Influenza B by PCR NEGATIVE NEGATIVE Final    Comment: (NOTE) The Xpert Xpress SARS-CoV-2/FLU/RSV plus assay is intended as an aid in the diagnosis of influenza from Nasopharyngeal swab specimens and should not be used as a sole basis for treatment. Nasal washings and aspirates are unacceptable for Xpert Xpress SARS-CoV-2/FLU/RSV testing.  Fact Sheet for Patients: EntrepreneurPulse.com.au  Fact Sheet for Healthcare Providers: IncredibleEmployment.be  This test is not yet approved or cleared by the [place] FDA and has been authorized for detection and/or diagnosis of SARS-CoV-2 by FDA under an Emergency Use Authorization (EUA). This EUA will remain in effect (meaning  this test can be used) for the duration of the COVID-19 declaration under Section 564(b)(1) of the Act, 21 U.S.C. section 360bbb-3(b)(1), unless the authorization is terminated or revoked.  Performed at [facility], [address] [place], [place] [address]   Gastrointestinal Panel by PCR , Stool     Status: None   Collection Time: 07/28/21  9:34 AM   Specimen: Stool  Result Value Ref Range Status   Campylobacter species NOT DETECTED NOT DETECTED Final   Plesimonas shigelloides NOT DETECTED NOT DETECTED Final   Salmonella species NOT DETECTED NOT DETECTED Final   Yersinia enterocolitica NOT DETECTED NOT DETECTED Final   Vibrio species NOT DETECTED NOT DETECTED Final   Vibrio cholerae NOT DETECTED NOT DETECTED Final   Enteroaggregative E coli (EAEC) NOT DETECTED NOT DETECTED Final   Enteropathogenic E coli (EPEC) NOT DETECTED NOT DETECTED Final   Enterotoxigenic E coli (ETEC) NOT DETECTED NOT DETECTED Final   Shiga like toxin producing E coli (STEC) NOT DETECTED NOT DETECTED Final   Shigella/Enteroinvasive E coli (EIEC) NOT DETECTED NOT DETECTED Final   Cryptosporidium NOT DETECTED NOT DETECTED Final   Cyclospora cayetanensis NOT DETECTED NOT DETECTED Final   Entamoeba histolytica NOT DETECTED NOT DETECTED Final   Giardia lamblia NOT DETECTED NOT DETECTED Final   Adenovirus F40/41 NOT DETECTED NOT DETECTED Final   Astrovirus NOT DETECTED NOT DETECTED Final   Norovirus GI/GII NOT DETECTED NOT DETECTED Final   Rotavirus A NOT DETECTED NOT DETECTED Final   Sapovirus (I, II, IV, and V) NOT DETECTED NOT DETECTED Final  Comment: Performed at [facility], [place], [place] [address]  C Difficile Quick Screen w PCR reflex     Status: None   Collection Time: 07/28/21  9:34 AM   Specimen: Stool  Result Value Ref Range Status   C Diff antigen NEGATIVE NEGATIVE Final   C Diff toxin NEGATIVE NEGATIVE Final   C Diff interpretation No C. difficile  detected.  Final    Comment: Performed at [facility], [address] [place], [place] [address]  MRSA Next Gen by PCR, Nasal     Status: None   Collection Time: 07/28/21  7:00 PM   Specimen: Nasal Mucosa; Nasal Swab  Result Value Ref Range Status   MRSA by PCR Next Gen NOT DETECTED NOT DETECTED Final    Comment: (NOTE) The GeneXpert MRSA Assay (FDA approved for NASAL specimens only), is one component of a comprehensive MRSA colonization surveillance program. It is not intended to diagnose MRSA infection nor to guide or monitor treatment for MRSA infections. Test performance is not FDA approved in patients less than 16 years old. Performed at [facility], [address] [place], [place] [address]   Urine Culture     Status: Abnormal   Collection Time: 07/29/21  4:54 AM   Specimen: Urine, Clean Catch  Result Value Ref Range Status   Specimen Description   Final    URINE, CLEAN CATCH Performed at [facility], [address] [place], [place] [address]    Special Requests   Final    NONE Performed at [facility], [address] [place], [place] [address]    Culture (A)  Final    <10,000 COLONIES/mL INSIGNIFICANT GROWTH Performed at [facility] [address], [place], [place] [address]    Report Status 07/30/2021 FINAL  Final     Labs: BNP (last 3 results) No results for input(s): BNP in the last 8760 hours. Basic Metabolic Panel: Recent Labs  Lab 07/28/21 0731 07/29/21 0847 07/30/21 0512 07/31/21 0426 08/01/21 0153  NA 141 139 139 138 140  K 3.5 3.4* 4.0 3.9 3.9  CL 109 105 106 108 108  CO2 '24 24 24 23 24  '$ GLUCOSE 107* 97 106* 109* 125*  BUN '8 9 11 16 21  '$ CREATININE 0.73 0.80 0.80 0.88 0.92  CALCIUM 8.9 9.1 9.2 8.9 9.2  MG  --  2.3  --   --   --    Liver Function Tests: Recent Labs  Lab 07/27/21 1405  AST 19  ALT 11  ALKPHOS 125  BILITOT 0.9  PROT 7.9  ALBUMIN 4.4   Recent Labs  Lab 07/27/21 1405   LIPASE 29   No results for input(s): AMMONIA in the last 168 hours. CBC: Recent Labs  Lab 07/28/21 0731 07/29/21 0847 07/30/21 0512 07/31/21 0426 08/01/21 0153  WBC 5.6 6.6 6.2 7.3 7.4  HGB 12.5 14.2 13.3 13.5 13.8  HCT 36.8 42.5 39.6 40.2 40.6  MCV 86.4 86.6 86.8 87.0 86.6  PLT 328 358 341 343 359   Cardiac Enzymes: No results for input(s): CKTOTAL, CKMB, CKMBINDEX, TROPONINI in the last 168 hours. BNP: Invalid input(s): POCBNP CBG: No results for input(s): GLUCAP in the last 168 hours. D-Dimer No results for input(s): DDIMER in the last 72 hours. Hgb A1c No results for input(s): HGBA1C in the last 72 hours. Lipid Profile No results for input(s): CHOL, HDL, LDLCALC, TRIG, CHOLHDL, LDLDIRECT in the last 72 hours. Thyroid  function studies No results for input(s): TSH, T4TOTAL, T3FREE, THYROIDAB in the last 72 hours.  Invalid input(s): FREET3 Anemia work up No results for input(s): VITAMINB12, FOLATE, FERRITIN, TIBC, IRON, RETICCTPCT in the last 72 hours. Urinalysis    Component Value Date/Time   COLORURINE STRAW (A) 07/27/2021 2151   APPEARANCEUR CLEAR (A) 07/27/2021 2151   APPEARANCEUR Hazy (A) 03/24/2021 1513   LABSPEC 1.004 (L) 07/27/2021 2151   PHURINE 7.0 07/27/2021 2151   GLUCOSEU NEGATIVE 07/27/2021 2151   HGBUR MODERATE (A) 07/27/2021 2151   BILIRUBINUR NEGATIVE 07/27/2021 2151   BILIRUBINUR Negative 03/24/2021 1513   KETONESUR 5 (A) 07/27/2021 2151   PROTEINUR NEGATIVE 07/27/2021 2151   UROBILINOGEN 0.2 05/21/2020 1103   UROBILINOGEN 1.0 05/15/2012 0519   NITRITE NEGATIVE 07/27/2021 2151   LEUKOCYTESUR TRACE (A) 07/27/2021 2151   Sepsis Labs Invalid input(s): PROCALCITONIN,  WBC,  LACTICIDVEN Microbiology Recent Results (from the past 240 hour(s))  Resp Panel by RT-PCR (Flu A&B, Covid) Nasopharyngeal Swab     Status: None   Collection Time: 07/27/21  8:09 PM   Specimen: Nasopharyngeal Swab; Nasopharyngeal(NP) swabs in vial transport medium   Result Value Ref Range Status   SARS Coronavirus 2 by RT PCR NEGATIVE NEGATIVE Final    Comment: (NOTE) SARS-CoV-2 target nucleic acids are NOT DETECTED.  The SARS-CoV-2 RNA is generally detectable in upper respiratory specimens during the acute phase of infection. The lowest concentration of SARS-CoV-2 viral copies this assay can detect is 138 copies/mL. A negative result does not preclude SARS-Cov-2 infection and should not be used as the sole basis for treatment or other patient management decisions. A negative result may occur with  improper specimen collection/handling, submission of specimen other than nasopharyngeal swab, presence of viral mutation(s) within the areas targeted by this assay, and inadequate number of viral copies(<138 copies/mL). A negative result must be combined with clinical observations, patient history, and epidemiological information. The expected result is Negative.  Fact Sheet for Patients:  EntrepreneurPulse.com.au  Fact Sheet for Healthcare Providers:  IncredibleEmployment.be  This test is no t yet approved or cleared by the [place] FDA and  has been authorized for detection and/or diagnosis of SARS-CoV-2 by FDA under an Emergency Use Authorization (EUA). This EUA will remain  in effect (meaning this test can be used) for the duration of the COVID-19 declaration under Section 564(b)(1) of the Act, 21 U.S.C.section 360bbb-3(b)(1), unless the authorization is terminated  or revoked sooner.       Influenza A by PCR NEGATIVE NEGATIVE Final   Influenza B by PCR NEGATIVE NEGATIVE Final    Comment: (NOTE) The Xpert Xpress SARS-CoV-2/FLU/RSV plus assay is intended as an aid in the diagnosis of influenza from Nasopharyngeal swab specimens and should not be used as a sole basis for treatment. Nasal washings and aspirates are unacceptable for Xpert Xpress SARS-CoV-2/FLU/RSV testing.  Fact Sheet for  Patients: EntrepreneurPulse.com.au  Fact Sheet for Healthcare Providers: IncredibleEmployment.be  This test is not yet approved or cleared by the [place] FDA and has been authorized for detection and/or diagnosis of SARS-CoV-2 by FDA under an Emergency Use Authorization (EUA). This EUA will remain in effect (meaning this test can be used) for the duration of the COVID-19 declaration under Section 564(b)(1) of the Act, 21 U.S.C. section 360bbb-3(b)(1), unless the authorization is terminated or revoked.  Performed at [facility], [address], [place], [place] [address]   Gastrointestinal Panel by PCR , Stool     Status: None  Collection Time: 07/28/21  9:34 AM   Specimen: Stool  Result Value Ref Range Status   Campylobacter species NOT DETECTED NOT DETECTED Final   Plesimonas shigelloides NOT DETECTED NOT DETECTED Final   Salmonella species NOT DETECTED NOT DETECTED Final   Yersinia enterocolitica NOT DETECTED NOT DETECTED Final   Vibrio species NOT DETECTED NOT DETECTED Final   Vibrio cholerae NOT DETECTED NOT DETECTED Final   Enteroaggregative E coli (EAEC) NOT DETECTED NOT DETECTED Final   Enteropathogenic E coli (EPEC) NOT DETECTED NOT DETECTED Final   Enterotoxigenic E coli (ETEC) NOT DETECTED NOT DETECTED Final   Shiga like toxin producing E coli (STEC) NOT DETECTED NOT DETECTED Final   Shigella/Enteroinvasive E coli (EIEC) NOT DETECTED NOT DETECTED Final   Cryptosporidium NOT DETECTED NOT DETECTED Final   Cyclospora cayetanensis NOT DETECTED NOT DETECTED Final   Entamoeba histolytica NOT DETECTED NOT DETECTED Final   Giardia lamblia NOT DETECTED NOT DETECTED Final   Adenovirus F40/41 NOT DETECTED NOT DETECTED Final   Astrovirus NOT DETECTED NOT DETECTED Final   Norovirus GI/GII NOT DETECTED NOT DETECTED Final   Rotavirus A NOT DETECTED NOT DETECTED Final   Sapovirus (I, II, IV, and V) NOT DETECTED NOT DETECTED Final     Comment: Performed at [facility], [address] [place], [place] [address]  C Difficile Quick Screen w PCR reflex     Status: None   Collection Time: 07/28/21  9:34 AM   Specimen: Stool  Result Value Ref Range Status   C Diff antigen NEGATIVE NEGATIVE Final   C Diff toxin NEGATIVE NEGATIVE Final   C Diff interpretation No C. difficile detected.  Final    Comment: Performed at [facility], [address] [place], [place] [address]  MRSA Next Gen by PCR, Nasal     Status: None   Collection Time: 07/28/21  7:00 PM   Specimen: Nasal Mucosa; Nasal Swab  Result Value Ref Range Status   MRSA by PCR Next Gen NOT DETECTED NOT DETECTED Final    Comment: (NOTE) The GeneXpert MRSA Assay (FDA approved for NASAL specimens only), is one component of a comprehensive MRSA colonization surveillance program. It is not intended to diagnose MRSA infection nor to guide or monitor treatment for MRSA infections. Test performance is not FDA approved in patients less than 48 years old. Performed at [facility], [address] [place], [place] [address]   Urine Culture     Status: Abnormal   Collection Time: 07/29/21  4:54 AM   Specimen: Urine, Clean Catch  Result Value Ref Range Status   Specimen Description   Final    URINE, CLEAN CATCH Performed at [facility], [address], [place], [place] [address]    Special Requests   Final    NONE Performed at [facility], [address], [place],  [address]    Culture (A)  Final    <10,000 COLONIES/mL INSIGNIFICANT GROWTH Performed at [facility] [address] [place],  [address]    Report Status 07/30/2021 FINAL  Final     Time coordinating discharge: Over 30 minutes  SIGNED:   Wyvonnia Dusky, MD  [facility] 08/01/2021, 2:42 PM Pager   If 7PM-7AM, please contact night-coverage

## 2021-08-01 NOTE — Telephone Encounter (Signed)
LMOV to schedule with EP

## 2021-08-01 NOTE — Anesthesia Postprocedure Evaluation (Signed)
Anesthesia Post Note  Patient: [name]  Procedure(s) Performed: CARDIOVERSION  Patient location during evaluation: Endoscopy Anesthesia Type: General Level of consciousness: awake and alert Pain management: pain level controlled Vital Signs Assessment: post-procedure vital signs reviewed and stable Respiratory status: spontaneous breathing, nonlabored ventilation, respiratory function stable and patient connected to nasal cannula oxygen Cardiovascular status: blood pressure returned to baseline and stable Postop Assessment: no apparent nausea or vomiting Anesthetic complications: no   No notable events documented.   Last Vitals:  Vitals:   08/01/21 1203 08/01/21 1204  BP:  (!) 98/56  Pulse: 70 68  Resp:  16  Temp:  37 C  SpO2:  98%    Last Pain:  Vitals:   08/01/21 1204  TempSrc: Oral  PainSc:                  Precious Haws Kaitelyn Jamison

## 2021-08-01 NOTE — Transfer of Care (Signed)
Immediate Anesthesia Transfer of Care Note  Patient: [name]  Procedure(s) Performed: CARDIOVERSION  Patient Location: PACU and Cath Lab  Anesthesia Type:General  Level of Consciousness: drowsy  Airway & Oxygen Therapy: Patient Spontanous Breathing and Patient connected to nasal cannula oxygen  Post-op Assessment: Report given to RN  Post vital signs: stable  Last Vitals:  Vitals Value Taken Time  BP 85/61 08/01/21 0735  Temp    Pulse 53 08/01/21 0736  Resp 14 08/01/21 0736  SpO2 98 % 08/01/21 0736    Last Pain:  Vitals:   08/01/21 0714  TempSrc: Oral  PainSc: 0-No pain      Patients Stated Pain Goal: 3 (51/89/84 2103)  Complications: No notable events documented.

## 2021-08-01 NOTE — Progress Notes (Signed)
  RD consulted for nutrition education. Per MD, pt requesting education on carb modified diet.    Lab Results  Component Value Date   HGBA1C 5.5 01/19/2021   PTA DM medications are none.   Labs reviewed: CBGS: 124 (inpatient orders for glycemic control are none).    Pt out of room, currently receiving cardioversion. Noted pt does not have history of DM nor is on any DM medications here or PTA. RD will modify diet order once pt is out of procedure (2 gram sodium for wider variety of meal selections).   Current diet order is NPO for cardioversion (previously heart healthy/ carb modified), patient is consuming approximately 50-100% of meals at this time. Labs and medications reviewed. No further nutrition interventions warranted at this time. RD contact information provided. If additional nutrition issues arise, please re-consult RD.  Loistine Chance, RD, LDN, [place] Registered Dietitian II Certified Diabetes Care and Education Specialist Please refer to [facility] for RD and/or RD on-call/weekend/after hours pager

## 2021-08-01 NOTE — Telephone Encounter (Signed)
Reviewed the patient's chart. She is still currently admitted as of today.  S/p DCCV on 08/01/21.  She sees Dr. Quentin Ore for EP.  To scheduling to arrange for follow up with EP provider.

## 2021-08-02 ENCOUNTER — Encounter: Payer: Self-pay | Admitting: Cardiology

## 2021-08-02 ENCOUNTER — Telehealth: Payer: Self-pay

## 2021-08-02 NOTE — Telephone Encounter (Signed)
Transition Care Management Follow-up Telephone Call Date of discharge and from where: [facility] 08-01-21 Dx: intractable n/v How have you been since you were released from the hospital? Feeling ok  Any questions or concerns? No  Items Reviewed: Did the pt receive and understand the discharge instructions provided? Yes  Medications obtained and verified? Yes  Other? No  Any new allergies since your discharge? No  Dietary orders reviewed? Yes Do you have support at home? Yes   Home Care and Equipment/Supplies: Were home health services ordered? no If so, what is the name of the agency? na  Has the agency set up a time to come to the patient's home? not applicable Were any new equipment or medical supplies ordered?  No What is the name of the medical supply agency? na Were you able to get the supplies/equipment? not applicable Do you have any questions related to the use of the equipment or supplies? No  Functional Questionnaire: (I = Independent and D = Dependent) ADLs: I  Bathing/Dressing- I  Meal Prep- I  Eating- I  Maintaining continence- I  Transferring/Ambulation- I  Managing Meds- I  Follow up appointments reviewed:  PCP Hospital f/u appt confirmed? Yes  Scheduled to see Dr Brita Romp on 08-11-21 @ 120pm. [facility] f/u appt confirmed? Yes  Scheduled to see Dr Quentin Ore  on 09-14-21 @ 10am. Are transportation arrangements needed? No  If their condition worsens, is the pt aware to call PCP or go to the Emergency Dept.? Yes Was the patient provided with contact information for the PCP's office or ED? Yes Was to pt encouraged to call back with questions or concerns? Yes

## 2021-08-02 NOTE — Telephone Encounter (Signed)
Patient on schedule for next available in July.  Please advise if needing sooner and if so where to overbook.

## 2021-08-03 ENCOUNTER — Telehealth: Payer: Self-pay

## 2021-08-03 NOTE — Telephone Encounter (Signed)
Transition Care Management Follow-up Telephone Call Date of discharge and from where: 5/22 [facility]  How have you been since you were released from the hospital? Getting along fairly well Any questions or concerns? No  Items Reviewed: Did the pt receive and understand the discharge instructions provided? Yes  Medications obtained and verified? Yes  Other? No  Any new allergies since your discharge? No  Dietary orders reviewed? No Do you have support at home? Yes   Home Care and Equipment/Supplies: Were home health services ordered? no  Functional Questionnaire: (I = Independent and D = Dependent) ADLs:  I  Bathing/Dressing- I  Meal Prep- I  Eating- I  Maintaining continence- I  Transferring/Ambulation- I  Managing Meds- I  Follow up appointments reviewed:  PCP Hospital f/u appt confirmed? Yes  Scheduled to see Dr.B on June 1. [facility] f/u appt confirmed? Yes  Scheduled to see cardiologist. Are transportation arrangements needed? No  If their condition worsens, is the pt aware to call PCP or go to the Emergency Dept.? Yes Was the patient provided with contact information for the PCP's office or ED? Yes Was to pt encouraged to call back with questions or concerns? Yes

## 2021-08-04 ENCOUNTER — Telehealth: Payer: Self-pay | Admitting: Cardiology

## 2021-08-04 ENCOUNTER — Observation Stay
Admission: EM | Admit: 2021-08-04 | Discharge: 2021-08-05 | Disposition: A | Discharge status: Home / Self Care | Payer: Medicare Other | Attending: Internal Medicine | Admitting: Internal Medicine

## 2021-08-04 ENCOUNTER — Other Ambulatory Visit: Payer: Self-pay

## 2021-08-04 DIAGNOSIS — I480 Paroxysmal atrial fibrillation: Secondary | ICD-10-CM | POA: Diagnosis not present

## 2021-08-04 DIAGNOSIS — G894 Chronic pain syndrome: Secondary | ICD-10-CM | POA: Diagnosis not present

## 2021-08-04 DIAGNOSIS — I951 Orthostatic hypotension: Secondary | ICD-10-CM

## 2021-08-04 DIAGNOSIS — I483 Typical atrial flutter: Secondary | ICD-10-CM

## 2021-08-04 DIAGNOSIS — I482 Chronic atrial fibrillation, unspecified: Secondary | ICD-10-CM

## 2021-08-04 DIAGNOSIS — J45909 Unspecified asthma, uncomplicated: Secondary | ICD-10-CM | POA: Diagnosis not present

## 2021-08-04 DIAGNOSIS — R079 Chest pain, unspecified: Secondary | ICD-10-CM | POA: Diagnosis not present

## 2021-08-04 DIAGNOSIS — Z96641 Presence of right artificial hip joint: Secondary | ICD-10-CM | POA: Insufficient documentation

## 2021-08-04 DIAGNOSIS — I10 Essential (primary) hypertension: Secondary | ICD-10-CM | POA: Diagnosis not present

## 2021-08-04 DIAGNOSIS — I4892 Unspecified atrial flutter: Principal | ICD-10-CM | POA: Insufficient documentation

## 2021-08-04 DIAGNOSIS — Z7901 Long term (current) use of anticoagulants: Secondary | ICD-10-CM | POA: Diagnosis not present

## 2021-08-04 DIAGNOSIS — Z79899 Other long term (current) drug therapy: Secondary | ICD-10-CM | POA: Diagnosis not present

## 2021-08-04 DIAGNOSIS — K5900 Constipation, unspecified: Secondary | ICD-10-CM | POA: Diagnosis not present

## 2021-08-04 DIAGNOSIS — G4730 Sleep apnea, unspecified: Secondary | ICD-10-CM

## 2021-08-04 DIAGNOSIS — G4733 Obstructive sleep apnea (adult) (pediatric): Secondary | ICD-10-CM | POA: Diagnosis not present

## 2021-08-04 DIAGNOSIS — I4891 Unspecified atrial fibrillation: Secondary | ICD-10-CM | POA: Diagnosis present

## 2021-08-04 DIAGNOSIS — K5909 Other constipation: Secondary | ICD-10-CM

## 2021-08-04 LAB — CBC
HCT: 51.8 % — ABNORMAL HIGH (ref 36.0–46.0)
Hemoglobin: 16.8 g/dL — ABNORMAL HIGH (ref 12.0–15.0)
MCH: 29.2 pg (ref 26.0–34.0)
MCHC: 32.4 g/dL (ref 30.0–36.0)
MCV: 89.9 fL (ref 80.0–100.0)
Platelets: 425 10*3/uL — ABNORMAL HIGH (ref 150–400)
RBC: 5.76 MIL/uL — ABNORMAL HIGH (ref 3.87–5.11)
RDW: 13.2 % (ref 11.5–15.5)
WBC: 8.1 10*3/uL (ref 4.0–10.5)
nRBC: 0 % (ref 0.0–0.2)

## 2021-08-04 LAB — BASIC METABOLIC PANEL
Anion gap: 15 (ref 5–15)
BUN: 10 mg/dL (ref 8–23)
CO2: 18 mmol/L — ABNORMAL LOW (ref 22–32)
Calcium: 10 mg/dL (ref 8.9–10.3)
Chloride: 104 mmol/L (ref 98–111)
Creatinine, Ser: 0.87 mg/dL (ref 0.44–1.00)
GFR, Estimated: >60 mL/min (ref >60)
Glucose, Bld: 152 mg/dL — ABNORMAL HIGH (ref 70–99)
Potassium: 4.7 mmol/L (ref 3.5–5.1)
Sodium: 137 mmol/L (ref 135–145)

## 2021-08-04 LAB — MAGNESIUM: Magnesium: 2.5 mg/dL — ABNORMAL HIGH (ref 1.7–2.4)

## 2021-08-04 MED ORDER — POLYETHYLENE GLYCOL 3350 17 G PO PACK
17.0000 g | PACK | Freq: Two times a day (BID) | ORAL | Status: DC
  Administered 2021-08-04 – 2021-08-05 (×2): 17 g via ORAL
  Filled 2021-08-04 (×2): qty 1

## 2021-08-04 MED ORDER — PANTOPRAZOLE SODIUM 40 MG PO TBEC
40.0000 mg | DELAYED_RELEASE_TABLET | Freq: Two times a day (BID) | ORAL | Status: DC
  Administered 2021-08-04 – 2021-08-05 (×2): 40 mg via ORAL
  Filled 2021-08-04 (×2): qty 1

## 2021-08-04 MED ORDER — PANCRELIPASE (LIP-PROT-AMYL) 12000-38000 UNITS PO CPEP
36000.0000 [IU] | ORAL_CAPSULE | Freq: Three times a day (TID) | ORAL | Status: DC
  Administered 2021-08-05: 36000 [IU] via ORAL
  Filled 2021-08-04 (×2): qty 3

## 2021-08-04 MED ORDER — MORPHINE SULFATE 15 MG PO TABS
15.0000 mg | ORAL_TABLET | ORAL | Status: AC
  Administered 2021-08-04: 15 mg via ORAL
  Filled 2021-08-04: qty 1

## 2021-08-04 MED ORDER — POLYETHYLENE GLYCOL 3350 17 G PO PACK: 17.0000 g | PACK | Freq: Every day | ORAL | Status: DC | PRN

## 2021-08-04 MED ORDER — FLECAINIDE ACETATE 100 MG PO TABS
100.0000 mg | ORAL_TABLET | Freq: Once | ORAL | Status: AC
  Administered 2021-08-04: 100 mg via ORAL
  Filled 2021-08-04: qty 1

## 2021-08-04 MED ORDER — DILTIAZEM HCL-DEXTROSE 125-5 MG/125ML-% IV SOLN (PREMIX)
5.0000 mg/h | INTRAVENOUS | Status: DC
  Administered 2021-08-04: 5 mg/h via INTRAVENOUS
  Filled 2021-08-04: qty 125

## 2021-08-04 MED ORDER — DILTIAZEM HCL 25 MG/5ML IV SOLN
10.0000 mg | Freq: Once | INTRAVENOUS | Status: AC
  Administered 2021-08-04: 10 mg via INTRAVENOUS
  Filled 2021-08-04: qty 5

## 2021-08-04 MED ORDER — ALPRAZOLAM 0.5 MG PO TABS
1.0000 mg | ORAL_TABLET | Freq: Every day | ORAL | Status: DC
  Administered 2021-08-04: 1 mg via ORAL
  Filled 2021-08-04: qty 2

## 2021-08-04 MED ORDER — BUSPIRONE HCL 10 MG PO TABS
15.0000 mg | ORAL_TABLET | Freq: Two times a day (BID) | ORAL | Status: DC
  Administered 2021-08-04 – 2021-08-05 (×2): 15 mg via ORAL
  Filled 2021-08-04 (×2): qty 2

## 2021-08-04 MED ORDER — DOCUSATE SODIUM 100 MG PO CAPS
100.0000 mg | ORAL_CAPSULE | Freq: Two times a day (BID) | ORAL | Status: DC
  Administered 2021-08-04 – 2021-08-05 (×2): 100 mg via ORAL
  Filled 2021-08-04 (×2): qty 1

## 2021-08-04 MED ORDER — DILTIAZEM HCL 25 MG/5ML IV SOLN
10.0000 mg | INTRAVENOUS | Status: AC
  Administered 2021-08-04: 10 mg via INTRAVENOUS
  Filled 2021-08-04: qty 5

## 2021-08-04 MED ORDER — SODIUM CHLORIDE 0.9% FLUSH: 3.0000 mL | INTRAVENOUS | Status: DC | PRN

## 2021-08-04 MED ORDER — MONTELUKAST SODIUM 10 MG PO TABS
10.0000 mg | ORAL_TABLET | Freq: Every day | ORAL | Status: DC
  Administered 2021-08-04: 10 mg via ORAL
  Filled 2021-08-04: qty 1

## 2021-08-04 MED ORDER — DILTIAZEM HCL 25 MG/5ML IV SOLN: 10.0000 mg | INTRAVENOUS | Status: DC | PRN

## 2021-08-04 MED ORDER — SODIUM CHLORIDE 0.9% FLUSH
3.0000 mL | Freq: Two times a day (BID) | INTRAVENOUS | Status: DC
  Administered 2021-08-04: 3 mL via INTRAVENOUS

## 2021-08-04 MED ORDER — BISACODYL 10 MG RE SUPP: 10.0000 mg | Freq: Every day | RECTAL | Status: DC | PRN

## 2021-08-04 MED ORDER — NAPHAZOLINE-PHENIRAMINE 0.025-0.3 % OP SOLN: 1.0000 [drp] | Freq: Four times a day (QID) | OPHTHALMIC | Status: DC | PRN

## 2021-08-04 MED ORDER — SODIUM CHLORIDE 0.9 % IV SOLN: 250.0000 mL | INTRAVENOUS | Status: DC | PRN

## 2021-08-04 MED ORDER — ACETAMINOPHEN 325 MG PO TABS: 650.0000 mg | ORAL_TABLET | ORAL | Status: DC | PRN

## 2021-08-04 MED ORDER — ONDANSETRON HCL 4 MG/2ML IJ SOLN: 4.0000 mg | Freq: Four times a day (QID) | INTRAMUSCULAR | Status: DC | PRN

## 2021-08-04 MED ORDER — APIXABAN 5 MG PO TABS
5.0000 mg | ORAL_TABLET | Freq: Two times a day (BID) | ORAL | Status: DC
  Administered 2021-08-04 – 2021-08-05 (×2): 5 mg via ORAL
  Filled 2021-08-04 (×2): qty 1

## 2021-08-04 MED ORDER — MORPHINE SULFATE 15 MG PO TABS
15.0000 mg | ORAL_TABLET | Freq: Four times a day (QID) | ORAL | Status: DC | PRN
  Administered 2021-08-05: 15 mg via ORAL
  Filled 2021-08-04: qty 1

## 2021-08-04 MED ORDER — LINACLOTIDE 145 MCG PO CAPS
145.0000 ug | ORAL_CAPSULE | Freq: Every day | ORAL | Status: DC
  Administered 2021-08-05: 145 ug via ORAL
  Filled 2021-08-04: qty 1

## 2021-08-04 MED ORDER — SODIUM CHLORIDE 0.9 % IV SOLN: INTRAVENOUS | Status: DC

## 2021-08-04 MED ORDER — SALINE SPRAY 0.65 % NA SOLN: 1.0000 | Freq: Every day | NASAL | Status: DC | PRN

## 2021-08-04 NOTE — Consult Note (Signed)
Cardiology Consultation:   Patient ID: [name] MRN: [id number]; DOB: 03-25-1960  Admit date: 08/04/2021 Date of Consult: 08/04/2021  PCP:  Virginia Crews, MD   [place] Prov[facility] Cardiologist:  Kate Sable, MD  Electrophysiologist:  Vickie Epley, MD  Physician requesting consult: Dr. Jacqualine Code Reason for consult: Atrial fibrillation with RVR  Patient Profile:   [name] is a 61 y.o. female with a hx of paroxysmal atrial fibrillation, prior atrial fibrillation ablation followed by EP, recent hospital admission several days ago for atrial fibrillation with RVR, had cardioversion and then started on flecainide who presents with tachypalpitations, orthostasis, general malaise, atrial fibrillation with RVR on EKG  History of Present Illness:   [name] was recently admitted to the hospital May 17 for atrial fibrillation with RVR with associated GI symptoms, watery diarrhea and discharge May 22 In the hospital treated with IV diltiazem, and oral Cardizem CT scan abdomen pelvis no acute pathology While inpatient with paroxysmal atrial fibrillation with RVR Has no improvement in her atrial fibrillation with oral medication she was cardioverted Aug 01, 2021, discharged with flecainide 50 twice daily Cardizem ER 120 daily  History of atrial flutter, paroxysmal A-fib with ablation Aug 06, 2020 History of pancreatic insufficiency, Nissen fundoplication  She called cardiology office this morning, felt she went back into atrial fibrillation last night, has been taking diltiazem 30 mg pills every 6 hours Heart rate initially improved but on getting out of bed this morning felt poorly with orthostasis, rapid rate, nausea and presented to the emergency room  In the emergency room EKG concerning for atrial flutter rate 120 bpm She has received Cardizem 10 mg IV push Blood pressure stable 505 systolic She reports that she did take her oral diltiazem ER 120 and  flecainide 50 mg this morning   Past Medical History:  Diagnosis Date   Abnormal EKG    HX OF INVERTED T WAVES ON EKG, PALPITATIONS, CHEST PAINS-CARDIAC WORK UP DID NOT SHOW ANY HEART DISEASE   AC (acromioclavicular) joint bone spurs    Acute postoperative pain 01/04/2017   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    Chronic headaches    Chronic, continuous use of opioids    DDD (degenerative disc disease)    CERVICAL AND LUMBAR-CHRONIC PAIN, RT HIP LABRAL TEAR   Depression    PT STATES A LOT OF STRESS IN HER LIFE   Dissociative disorder    Dizziness 04/22/2013   Duodenal ulcer with hemorrhage and perforation ([place]) 04/27/2003   Foot drop, right    FROM BACK SURGERY   GERD (gastroesophageal reflux disease)    H/O arthrodesis 08/18/2014   Headache(784.0)    AND NECK PAIN--STATES RECENT TEST SHOW CERVICAL DEGENERATION   History of blood transfusion    s/p back surgery   History of cardiac cath    a. 05/2019 Cath: Nl cors. EF 55-65%.   History of cardioversion    History of cervical spinal surgery 01/04/2015   History of kidney stones    Hypertension    Inverted T wave    Iron deficiency 02/01/2021   Mitral regurgitation    a. 07/2020 Echo: EF 60-65%, no rwma. Nl RV size/fxn. RVSP 39.60mHg. Mildly to mod dil LA. Mod MR; b. 07/2020 TEE: EF 55-60%. Lambl's excresence. Nl RV size/fxn. Midly  dil LA. No LA/LAA thrombus. Mild MR.   Narrowing of intervertebral disc space 08/18/2014   Currently on disability.    Orthostatic hypotension 04/22/2013   Pain    CHRONIC NECK AND BACK PAIN - LIMITED ROM NECK - S/P FUSIONS CERVICAL AND LUMBAR   Paroxysmal atrial fibrillation ([place]) 2021   a. 07/2020 s/p PVI.   Pneumonia    PONV (postoperative nausea and vomiting)    PT GIVES HX OF N&V AND FEVER WITH SURGERIES YEARS AGO--BUT NO PROBLEMS WITH MORE RECENT  SURGERIES--STATES NOT MALIGNANT HYPERTHERMIA   Postop Hyponatremia 05/14/2012   Postoperative anemia due to acute blood loss 05/14/2012   PTSD (post-traumatic stress disorder)    Right foot drop    Right hip arthralgia 08/18/2014   Status post surgery of right, and now needs left.    Sleep apnea 2021   does not have a cpap   Therapeutic opioid-induced constipation (OIC)    Typical atrial flutter [facility])     Past Surgical History:  Procedure Laterality Date   ABDOMINAL HYSTERECTOMY     afib  05/2019   ANTERIOR CERVICAL DECOMP/DISCECTOMY FUSION N/A 10/30/2012   Procedure: ACDF C5-6, EXPLORATION AND HARDWARE REMOVAL C6-7;  Surgeon: Melina Schools, MD;  Location: [place];  Service: Orthopedics;  Laterality: N/A;   ANTERIOR FUSION CERVICAL SPINE  MAY 2012   AT [place] N/A 08/06/2020   Procedure: ATRIAL FIBRILLATION ABLATION;  Surgeon: Vickie Epley, MD;  Location: [place] [facility];  Service: Cardiovascular;  Laterality: N/A;   BACK SURGERY  2009   LUMBAR FUSION WITH RODS    BREAST BIOPSY Right 2008   benign.- bx/clip   BREAST EXCISIONAL BIOPSY Left 1998   benign   BREAST REDUCTION SURGERY Bilateral 06/2016   CARDIAC ELECTROPHYSIOLOGY MAPPING AND ABLATION     CARDIOVERSION N/A 12/03/2019   Procedure: CARDIOVERSION;  Surgeon: Kate Sable, MD;  Location: [place] [facility];  Service: Cardiovascular;  Laterality: N/A;   CARDIOVERSION N/A 08/01/2021   Procedure: CARDIOVERSION;  Surgeon: Kate Sable, MD;  Location: [facility];  Service: Cardiovascular;  Laterality: N/A;   CARPAL TUNNEL RELEASE  05-06-12   Right   CHOLECYSTECTOMY     COLONOSCOPY WITH PROPOFOL N/A 01/26/2017   Procedure: COLONOSCOPY WITH PROPOFOL;  Surgeon: Lucilla Lame, MD;  Location: [place];  Service: Endoscopy;  Laterality: N/A;   COLONOSCOPY WITH PROPOFOL N/A 01/16/2021   Procedure: COLONOSCOPY WITH PROPOFOL;  Surgeon: Lin Landsman, MD;  Location: [facility] ENDOSCOPY;  Service:  Gastroenterology;  Laterality: N/A;   DIAGNOSTIC LAPAROSCOPIES - MULTIPLE FOR ENDOMETRIOSIS     ESOPHAGEAL DILATION N/A 01/26/2017   Procedure: ESOPHAGEAL DILATION;  Surgeon: Lucilla Lame, MD;  Location: [place];  Service: Endoscopy;  Laterality: N/A;   ESOPHAGEAL MANOMETRY N/A 05/30/2017   Procedure: ESOPHAGEAL MANOMETRY (EM);  Surgeon: Lucilla Lame, MD;  Location: [facility];  Service: Endoscopy;  Laterality: N/A;   ESOPHAGOGASTRODUODENOSCOPY (EGD) WITH PROPOFOL N/A 01/26/2017   Procedure: ESOPHAGOGASTRODUODENOSCOPY (EGD) WITH PROPOFOL;  Surgeon: Lucilla Lame, MD;  Location: [place];  Service: Endoscopy;  Laterality: N/A;   ESOPHAGOGASTRODUODENOSCOPY (EGD) WITH PROPOFOL N/A 01/30/2020   Procedure: ESOPHAGOGASTRODUODENOSCOPY (EGD) WITH PROPOFOL;  Surgeon: Lucilla Lame, MD;  Location: [place];  Service: Endoscopy;  Laterality: N/A;  sleep apnea COVID + 01-15-20   ESOPHAGOGASTRODUODENOSCOPY (EGD) WITH PROPOFOL N/A 01/15/2021   Procedure: ESOPHAGOGASTRODUODENOSCOPY (EGD) WITH PROPOFOL;  Surgeon: Lin Landsman, MD;  Location: [place];  Service: Gastroenterology;  Laterality: N/A;  HIP ARTHROSCOPY  09/20/2011   Procedure: ARTHROSCOPY HIP;  Surgeon: Gearlean Alf, MD;  Location: [facility];  Service: Orthopedics;  Laterality: Right;  Right Hip Scope with Labral Debridement   LEFT HEART CATH AND CORONARY ANGIOGRAPHY Left 06/10/2019   Procedure: LEFT HEART CATH AND CORONARY ANGIOGRAPHY;  Surgeon: Nelva Bush, MD;  Location: [place] [facility];  Service: Cardiovascular;  Laterality: Left;   NASAL SEPTUM SURGERY  MARCH 2013   IN [place]   [facility] IMPEDANCE STUDY N/A 05/30/2017   Procedure: [place] IMPEDANCE STUDY;  Surgeon: Lucilla Lame, MD;  Location: [facility]OSCOPY;  Service: Endoscopy;  Laterality: N/A;   POLYPECTOMY N/A 01/26/2017   Procedure: POLYPECTOMY;  Surgeon: Lucilla Lame, MD;  Location: [place];  Service: Endoscopy;  Laterality: N/A;    POSTERIOR CERVICAL FUSION/FORAMINOTOMY N/A 04/16/2013   Procedure: REMOVAL CERVICAL PLATES AND INTERBODY CAGE/POSTERIOR CERVICAL SPINAL FUSION C4 - C6/C5 CORPECTOMY/C4 - C6 FUSION WITH ILIAC CREST BONE GRAFT;  Surgeon: Melina Schools, MD;  Location: [place];  Service: Orthopedics;  Laterality: N/A;   RADIOFREQUENCY ABLATION NERVES     REDUCTION MAMMAPLASTY Bilateral 05/2016   RIGHT HIP ARTHROSCOPY FOR LABRAL TEAR  ABOUT 2010   2012 also   SHOULDER ARTHROSCOPY  05-06-12   bone spur   TEE WITHOUT CARDIOVERSION N/A 08/05/2020   Procedure: TRANSESOPHAGEAL ECHOCARDIOGRAM (TEE);  Surgeon: Lelon Perla, MD;  Location: [facility] ENDOSCOPY;  Service: Cardiovascular;  Laterality: N/A;   TONSILLECTOMY     AS A CHILD   TOTAL HIP ARTHROPLASTY Right 05/13/2012   Procedure: TOTAL HIP ARTHROPLASTY ANTERIOR APPROACH;  Surgeon: Gearlean Alf, MD;  Location: [facility];  Service: Orthopedics;  Laterality: Right;     Home Medications:  Prior to Admission medications   Medication Sig Start Date End Date Taking? Authorizing Provider  ALPRAZolam Duanne Moron) 1 MG tablet Take 1 mg by mouth at bedtime.  07/04/19   [provider]  apixaban (ELIQUIS) 5 MG TABS tablet Take 1 tablet (5 mg total) by mouth 2 (two) times daily. 04/12/21   Kate Sable, MD  busPIRone (BUSPAR) 15 MG tablet Take 15 mg by mouth 2 (two) times daily. 07/20/21   [provider]  CREON [id number] units CPEP capsule Take 2 capsules with the first bite of each meal and 1 capsule with the first bite of each snack 01/27/21   Lin Landsman, MD  diltiazem (CARDIZEM CD) 120 MG 24 hr capsule Take 1 capsule (120 mg total) by mouth daily. 06/27/21   Kate Sable, MD  diltiazem (CARDIZEM) 30 MG tablet Take 1 tablet (30 mg total) by mouth every 6 (six) hours as needed. 07/21/21   Kate Sable, MD  flecainide (TAMBOCOR) 50 MG tablet Take 1 tablet (50 mg total) by mouth every 12 (twelve) hours. 08/01/21 08/31/21  Wyvonnia Dusky, MD   linaclotide (LINZESS) 145 MCG CAPS capsule TAKE 1 CAPSULE BY MOUTH DAILY BEFORE BREAKFAST. Patient taking differently: Take 145 mcg by mouth daily as needed. 06/03/21   Lucilla Lame, MD  Menthol, Topical Analgesic, (BENGAY EX) Apply 1 application topically daily as needed (pain).    [provider]  montelukast (SINGULAIR) 10 MG tablet TAKE 1 TABLET BY MOUTH EVERY DAY 03/08/21   Tally Joe T, FNP  morphine (MSIR) 15 MG tablet Take 1 tablet (15 mg total) by mouth every 6 (six) hours as needed for moderate pain or severe pain. Must last 30 days. 07/22/21 08/21/21  Milinda Pointer, MD  naphazoline-pheniramine (VISINE) 0.025-0.3 % ophthalmic solution  1 drop 4 (four) times daily as needed for eye irritation.    [provider]  pantoprazole (PROTONIX) 40 MG tablet TAKE 1 TABLET BY MOUTH TWICE A DAY 06/03/21   Lucilla Lame, MD  polyethylene glycol (MIRALAX / GLYCOLAX) 17 g packet Take 17 g by mouth daily as needed.    [provider]  sodium chloride (OCEAN) 0.65 % SOLN nasal spray Place 1 spray into both nostrils daily as needed for congestion (Dryness).    [provider]  valACYclovir (VALTREX) 1000 MG tablet TAKE TWO TABLETS BY MOUTH TWICE DAILY FOR ONE DAY FEVER BLISTER 06/13/21   Bacigalupo, Dionne Bucy, MD    Inpatient Medications: Scheduled Meds:  Continuous Infusions:  PRN Meds:   Allergies:    Allergies  Allergen Reactions   Amoxicillin Hives    She did ok w ANCEF   Chlorhexidine Gluconate Dermatitis and Hives   Clindamycin Hives   Codeine Hives   Erythromycin Hives    "mycins" in general   Penicillin G Hives    "cillins" in general   Sulfa Antibiotics Nausea And Vomiting and Hives        Levofloxacin Hives   Shellfish Allergy Hives   Decadron [Dexamethasone] Other (See Comments)    Hot flashes, insomnia, "manic" Hot flashes, insomnia, "manic"   Mangifera Indica Hives    papaya   Papaya Derivatives Hives   Betadine [Povidone Iodine]  Hives        Clarithromycin Hives   Other Hives     Mango    Povidone-Iodine Hives   Prednisone Anxiety    High blood pressure, flushed, mood changes, heart palpitations High blood pressure, flushed, mood changes, heart palpitations    Social History:   Social History   Socioeconomic History   Marital status: Married    Spouse name: bruce   Number of children: 2   Years of education: Not on file   Highest education level: Associate degree: occupational, Hotel manager, or vocational program  Occupational History    Comment: disabled  Tobacco Use   Smoking status: Never   Smokeless tobacco: Never  Vaping Use   Vaping Use: Never used  Substance and Sexual Activity   Alcohol use: No    Alcohol/week: 0.0 standard drinks   Drug use: Yes    Comment: prescribed morphine and xanax   Sexual activity: Yes  Other Topics Concern   Not on file  Social History Narrative   Son, daughter  And 4 grandkids; raises 3 of the grandchildren      Lives in [place]; with husband; never smoked; no alcohol; 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    Social Determinants of Health   Financial Resource Strain: Low Risk    Difficulty of Paying Living Expenses: Not hard at all  Food Insecurity: No Food Insecurity   Worried About Charity fundraiser in the Last Year: Never true   [place] in the Last Year: Never true  Transportation Needs: No Transportation Needs   Lack of Transportation (Medical): No   Lack of Transportation (Non-Medical): No  Physical Activity: Insufficiently Active   Days of Exercise per Week: 2 days   Minutes of Exercise per Session: 20 min  Stress: No Stress Concern Present   Feeling of Stress : Only a little  Social Connections: Moderately Integrated   Frequency of Communication with Friends and Family: More than three times a week   Frequency of Social Gatherings with Friends and Family: Never  Attends Religious Services: 1 to 4 times per year   Active Member of Clubs or  Organizations: No   Attends Archivist Meetings: Never   Marital Status: Married  Human resources officer Violence: Not At Risk   Fear of Current or Ex-Partner: No   Emotionally Abused: No   Physically Abused: No   Sexually Abused: No    Family History:    Family History  Problem Relation Age of Onset   Aneurysm Mother    Bipolar disorder Sister    Aneurysm Maternal Grandmother    Breast cancer Paternal Grandmother 71   Bipolar disorder Grandchild    Anxiety disorder Grandchild    Depression Grandchild    Cancer Neg Hx      ROS:  Please see the history of present illness.  Review of Systems  Constitutional: Negative.   HENT: Negative.    Respiratory:  Positive for shortness of breath.   Cardiovascular:  Positive for palpitations.  Gastrointestinal: Negative.   Musculoskeletal: Negative.   Neurological: Negative.   Psychiatric/Behavioral: Negative.    All other systems reviewed and are negative.   Physical Exam/Data:   Vitals:   08/04/21 1141 08/04/21 1143 08/04/21 1230 08/04/21 1300  BP: (!) 140/113  119/90 119/90  Pulse: (!) 120  (!) 110 (!) 112  Resp: '18  13 15  '$ Temp: 98.6 F (37 C)     SpO2: 98%  99% 100%  Weight:  69 kg    Height:  '5\' 4"'$  (1.626 m)     No intake or output data in the 24 hours ending 08/04/21 1332    08/04/2021   11:43 AM 08/01/2021   11:50 AM 07/27/2021    2:02 PM  Last 3 Weights  Weight (lbs) 152 lb 1.9 oz 154 lb 154 lb  Weight (kg) 69 kg 69.854 kg 69.854 kg     Body mass index is 26.11 kg/m.  General:  Well nourished, well developed, in no acute distress HEENT: normal Neck: no JVD Vascular: No carotid bruits; Distal pulses 2+ bilaterally Cardiac: Irregularly irregular;  no murmur  Lungs:  clear to auscultation bilaterally, no wheezing, rhonchi or rales  Abd: soft, nontender, no hepatomegaly  Ext: no edema Musculoskeletal:  No deformities, BUE and BLE strength normal and equal Skin: warm and dry  Neuro:  CNs 2-12 intact, no  focal abnormalities noted Psych:  Normal affect   EKG:  The EKG was personally reviewed and demonstrates:   Atrial flutter rate 120 bpm Telemetry:  Telemetry was personally reviewed and demonstrates: Atrial fibs flutter rate 95  Relevant CV Studies: Echo  1. Left ventricular ejection fraction, by estimation, is 55 to 60%. Left  ventricular ejection fraction by 2D MOD biplane is 55.3 %. The left  ventricle has normal function. The left ventricle has no regional wall  motion abnormalities. There is mild left  ventricular hypertrophy. Left ventricular diastolic parameters are  indeterminate.   2. Right ventricular systolic function is normal. The right ventricular  size is normal.   3. The mitral valve is normal in structure. Trivial mitral valve  regurgitation.   4. The aortic valve is tricuspid. Aortic valve regurgitation is trivial.   Laboratory Data:  High Sensitivity Troponin:   Recent Labs  Lab 07/27/21 1405 07/27/21 1741  TROPONINIHS 9 9     Chemistry Recent Labs  Lab 07/29/21 0847 07/30/21 0512 07/31/21 0426 08/01/21 0153 08/04/21 1206  NA 139   < > 138 140 137  K 3.4*   < >  3.9 3.9 4.7  CL 105   < > 108 108 104  CO2 24   < > 23 24 18*  GLUCOSE 97   < > 109* 125* 152*  BUN 9   < > '16 21 10  '$ CREATININE 0.80   < > 0.88 0.92 0.87  CALCIUM 9.1   < > 8.9 9.2 10.0  MG 2.3  --   --   --  2.5*  GFRNONAA >60   < > >60 >60 >60  ANIONGAP 10   < > '7 8 15   '$ < > = values in this interval not displayed.    No results for input(s): PROT, ALBUMIN, AST, ALT, ALKPHOS, BILITOT in the last 168 hours. Lipids No results for input(s): CHOL, TRIG, HDL, LABVLDL, LDLCALC, CHOLHDL in the last 168 hours.  Hematology Recent Labs  Lab 07/31/21 0426 08/01/21 0153 08/04/21 1206  WBC 7.3 7.4 8.1  RBC 4.62 4.69 5.76*  HGB 13.5 13.8 16.8*  HCT 40.2 40.6 51.8*  MCV 87.0 86.6 89.9  MCH 29.2 29.4 29.2  MCHC 33.6 34.0 32.4  RDW 13.1 13.0 13.2  PLT 343 359 425*   Thyroid No results  for input(s): TSH, FREET4 in the last 168 hours.  BNPNo results for input(s): BNP, PROBNP in the last 168 hours.  DDimer No results for input(s): DDIMER in the last 168 hours.   Radiology/Studies:  No results found.   Assessment and Plan:   Atrial fibs/flutter, paroxysmal Prior ablation May 2022 Off amiodarone Followed by EP, Dr. Quentin Ore Recent hospitalization for GI pathology, diarrhea, persistent atrial flutter with poorly controlled rate -Normal sinus rhythm restored last week by cardioversion, started on flecainide 50 twice daily with her diltiazem ER 120 -Recurrence of her atrial flutter starting last night, good to control rate at home with oral diltiazem 30 -Recommend we give her additional flecainide 100, additional boluses of Cardizem 10 mg IV push in effort to convert -We have offered both inpatient or outpatient cardioversion tomorrow morning depending on her symptoms and rate control -Would likely benefit from higher dose flecainide 100 twice daily and Cardizem ER 180 daily once normal sinus rhythm restored As outpatient follow-up with the EP given frequent symptomatic tachyarrhythmia   Total encounter time more than 80 minutes  Greater than 50% was spent in counseling and coordination of care with the patient     For questions or updates, please contact [facility] Please consult [email or web address] for contact info under    Signed, Ida Rogue, MD  08/04/2021 1:32 PM

## 2021-08-04 NOTE — ED Triage Notes (Signed)
Pt to ED via POV form home. Pt with hx afib and recently hospitalized and cardioverted on Monday. Pt reports last night she felt herself go back into afib. Pt reports CP, nausea, SOB and lightheaded.

## 2021-08-04 NOTE — Telephone Encounter (Signed)
Patient c/o Palpitations:  High priority if patient c/o lightheadedness, shortness of breath, or chest pain  How long have you had palpitations/irregular HR/ Afib? Are you having the symptoms now? Yes  Are you currently experiencing lightheadedness, SOB or CP? Yes  Do you have a history of afib (atrial fibrillation) or irregular heart rhythm? Yes  Have you checked your BP or HR? (document readings if available): 120 - HR  Are you experiencing any other symptoms? Pass out and nauseated

## 2021-08-04 NOTE — H&P (Addendum)
[facility] History and Physical  [name] QQI:[id number] DOB: [birth year] DOA: 08/04/2021  Referring physician: ED  PCP: Virginia Crews, MD   Patient is coming from: Home  Chief Complaint: Palpitations,near syncope  HPI:  Patient is 61 years old female with past medical history of atrial flutter status post DC cardioversion and ablation in the past, chronic pain syndrome, depression, GERD, hypertension, presented to hospital with complaints of palpitations and near passing out spells and fatigue/weakness for the last 1 to 2 days.  Of note patient was recently admitted to hospital for diarrhea and GI symptoms when she was noted to have atrial flutter and underwent cardioversion at that time with restoration of normal sinus rhythm 08/01/2021.  She was given flecainide and Cardizem on discharge which she was taking.  At night after taking shower patient was very symptomatic with palpitations sweating nausea near passing out spells.  Patient denies any chest pain.  Denies any vomiting  urinary urgency frequency or dysuria has had constipation for the last 5 to 6 days.  Is any fever, chills or rigor.  Denies any cough congestion or sputum production.  Denies any falls or syncope.  Due to the symptoms patient then presented to the ED.  In the ED, patient was noted to have atrial flutter with heart rates in 120s and cardiology was notified from the ED and patient was given Cardizem 10 mg IV x2 and 100 mg of flecainide and morphine 15 mg x 1.  Cardiology has recommended overnight observation and possible cardioversion at this time.  Assessment and plan. Principal Problem:   Atrial flutter (HCC) Active Problems:   Chronic pain syndrome   Hypertension   Constipation   Symptomatic atrial flutter/fibrillation with rapid ventricular response.  Patient is symptomatic with palpitations and near syncope, generalized weakness and fatigue.  Seen by cardiology Dr. Rockey Situ in the ED.  Flecainide  dose has been increased to 100 mg from 50 milligrams.  Patient might need  DC cardioversion tomorrow if she remains symptomatic and does not convert. Cardiology recommends Cardizem drip in the interim.  History of atrial flutter ablation in 2022.  Patient is currently anticoagulated with Eliquis.  We will continue with Eliquis..  Patient was on Cardizem 120 mg daily at home and has taken today's dose.    Has taken flecainide in the morning and received 100 mg today.  Will likely need 100 mg of flecainide twice a day.  Patient follows up with Dr. Quentin Ore electrophysiology as outpatient. Follow cardiology recommendations on further treatment plan.  Latest 2D echocardiogram with LV ejection fraction of 55 to 60%.  Chronic pain syndrome Patient does have history of degenerative joint disease, cervical spine surgery in the past with total knee replacement and hip issues..  Patient takes morphine 15 mg every 6 hours as outpatient will resume while in the hospital.  She has been on chronic pain medication for several years.  History of depression On buspirone and Xanax as outpatient.  We will continue.  GERD/ peptic ulcer disease Continue Protonix.  Constipation.  Taking MiraLAX as outpatient without much relief.  We will continue with bowel regimen while in the hospital.  Add Colace, Dulcolax suppository.  Patient is also on chronic morphine at home.  Will resume home linaclotide as well  Essential hypertension On Cardizem at home took her Cardizem this morning.  We will continue to monitor.  History of obstructive sleep apnea.  Will resume CPAP from home.   DVT Prophylaxis: Eliquis  Review of Systems:  All systems were reviewed and were negative unless otherwise mentioned in the HPI   Past Medical History:  Diagnosis Date   Abnormal EKG    HX OF INVERTED T WAVES ON EKG, PALPITATIONS, CHEST PAINS-CARDIAC WORK UP DID NOT SHOW ANY HEART DISEASE   AC (acromioclavicular) joint bone spurs    Acute  postoperative pain 01/04/2017   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    Chronic headaches    Chronic, continuous use of opioids    DDD (degenerative disc disease)    CERVICAL AND LUMBAR-CHRONIC PAIN, RT HIP LABRAL TEAR   Depression    PT STATES A LOT OF STRESS IN HER LIFE   Dissociative disorder    Dizziness 04/22/2013   Duodenal ulcer with hemorrhage and perforation ([place]) 04/27/2003   Foot drop, right    FROM BACK SURGERY   GERD (gastroesophageal reflux disease)    H/O arthrodesis 08/18/2014   Headache(784.0)    AND NECK PAIN--STATES RECENT TEST SHOW CERVICAL DEGENERATION   History of blood transfusion    s/p back surgery   History of cardiac cath    a. 05/2019 Cath: Nl cors. EF 55-65%.   History of cardioversion    History of cervical spinal surgery 01/04/2015   History of kidney stones    Hypertension    Inverted T wave    Iron deficiency 02/01/2021   Mitral regurgitation    a. 07/2020 Echo: EF 60-65%, no rwma. Nl RV size/fxn. RVSP 39.11mHg. Mildly to mod dil LA. Mod MR; b. 07/2020 TEE: EF 55-60%. Lambl's excresence. Nl RV size/fxn. Midly dil LA. No LA/LAA thrombus. Mild MR.   Narrowing of intervertebral disc space 08/18/2014   Currently on disability.    Orthostatic hypotension 04/22/2013   Pain    CHRONIC NECK AND BACK PAIN - LIMITED ROM NECK - S/P FUSIONS CERVICAL AND LUMBAR   Paroxysmal atrial fibrillation ([place] 2021   a. 07/2020 s/p PVI.   Pneumonia    PONV (postoperative nausea and vomiting)    PT GIVES HX OF N&V AND FEVER WITH SURGERIES YEARS AGO--BUT NO PROBLEMS WITH MORE RECENT SURGERIES--STATES NOT MALIGNANT HYPERTHERMIA   Postop Hyponatremia 05/14/2012   Postoperative anemia due to acute blood loss 05/14/2012   PTSD (post-traumatic stress disorder)    Right foot drop    Right hip arthralgia 08/18/2014   Status post surgery of  right, and now needs left.    Sleep apnea 2021   does not have a cpap   Therapeutic opioid-induced constipation ([facility])    Typical atrial flutter ([facility]    Past Surgical History:  Procedure Laterality Date   ABDOMINAL HYSTERECTOMY     afib  05/2019   ANTERIOR CERVICAL DECOMP/DISCECTOMY FUSION N/A 10/30/2012   Procedure: ACDF C5-6, EXPLORATION AND HARDWARE REMOVAL C6-7;  Surgeon: DMelina Schools MD;  [place]  Service: [place]opedics;  Laterality: N/A;   [place]   Procedure: ATRIAL FIBRILLATION ABLATION;  Surgeon: LVickie Epley MD;  [place];  Service: Cardiovascular;  Laterality: N/A;   BACK SURGERY  2009   LUMBAR FUSION WITH RODS    BREAST BIOPSY Right 2008   benign.- bx/clip   BREAST EXCISIONAL BIOPSY Left 1998  benign   BREAST REDUCTION SURGERY Bilateral 06/2016   CARDIAC ELECTROPHYSIOLOGY MAPPING AND ABLATION     CARDIOVERSION N/A 12/03/2019   Procedure: CARDIOVERSION;  Surgeon: Kate Sable, MD;  Location: [place] [facility];  Service: Cardiovascular;  Laterality: N/A;   CARDIOVERSION N/A 08/01/2021   Procedure: CARDIOVERSION;  Surgeon: Kate Sable, MD;  Location: [facility];  Service: Cardiovascular;  Laterality: N/A;   CARPAL TUNNEL RELEASE  05-06-12   Right   CHOLECYSTECTOMY     COLONOSCOPY WITH PROPOFOL N/A 01/26/2017   Procedure: COLONOSCOPY WITH PROPOFOL;  Surgeon: Lucilla Lame, MD;  Location: [place];  Service: Endoscopy;  Laterality: N/A;   COLONOSCOPY WITH PROPOFOL N/A 01/16/2021   Procedure: COLONOSCOPY WITH PROPOFOL;  Surgeon: Lin Landsman, MD;  Location[facility];  Service: Gastroenterology;  Laterality: N/A;   DIAGNOSTIC LAPAROSCOPIES - MULTIPLE FOR ENDOMETRIOSIS     ESOPHAGEAL DILATION N/A 01/26/2017   Procedure: ESOPHAGEAL DILATION;  Surgeon: Lucilla Lame, MD;  Location: [place];  Service: Endoscopy;  Laterality:  N/A;   ESOPHAGEAL MANOMETRY N/A 05/30/2017   Procedure: ESOPHAGEAL MANOMETRY (EM);  Surgeon: Lucilla Lame, MD;  Location: [facility];  Service: Endoscopy;  Laterality: N/A;   ESOPHAGOGASTRODUODENOSCOPY (EGD) WITH PROPOFOL N/A 01/26/2017   Procedure: ESOPHAGOGASTRODUODENOSCOPY (EGD) WITH PROPOFOL;  Surgeon: Lucilla Lame, MD;  Location: [place];  Service: Endoscopy;  Laterality: N/A;   ESOPHAGOGASTRODUODENOSCOPY (EGD) WITH PROPOFOL N/A 01/30/2020   Procedure: ESOPHAGOGASTRODUODENOSCOPY (EGD) WITH PROPOFOL;  Surgeon: Lucilla Lame, MD;  Location: [place];  Service: Endoscopy;  Laterality: N/A;  sleep apnea COVID + 01-15-20   ESOPHAGOGASTRODUODENOSCOPY (EGD) WITH PROPOFOL N/A 01/15/2021   Procedure: ESOPHAGOGASTRODUODENOSCOPY (EGD) WITH PROPOFOL;  Surgeon: Lin Landsman, MD;  Location: [place];  Service: Gastroenterology;  Laterality: N/A;   HIP ARTHROSCOPY  09/20/2011   Procedure: ARTHROSCOPY HIP;  Surgeon: Gearlean Alf, MD;  [facility];  Service: Orthopedics;  Laterality: Right;  Right Hip Scope with Labral Debridement   LEFT HEART CATH AND CORONARY ANGIOGRAPHY Left 06/10/2019   Procedure: LEFT HEART CATH AND CORONARY ANGIOGRAPHY;  Surgeon: Nelva Bush, MD;  Location: [place] [facility];  Service: Cardiovascular;  Laterality: Left;   NASAL SEPTUM SURGERY  MARCH 2013   IN [place]   [facility] IMPEDANCE STUDY N/A 05/30/2017   Procedure: [place] IMPEDANCE STUDY;  Surgeon: Lucilla Lame, MD;  Location: [facility];  Service: Endoscopy;  Laterality: N/A;   POLYPECTOMY N/A 01/26/2017   Procedure: POLYPECTOMY;  Surgeon: Lucilla Lame, MD;  Location: [place];  Service: Endoscopy;  Laterality: N/A;   POSTERIOR CERVICAL FUSION/FORAMINOTOMY N/A 04/16/2013   Procedure: REMOVAL CERVICAL PLATES AND INTERBODY CAGE/POSTERIOR CERVICAL SPINAL FUSION C4 - C6/C5 CORPECTOMY/C4 - C6 FUSION WITH ILIAC CREST BONE GRAFT;  Surgeon: Melina Schools, MD;  Location: [place];   Service: Orthopedics;  Laterality: N/A;   RADIOFREQUENCY ABLATION NERVES     REDUCTION MAMMAPLASTY Bilateral 05/2016   RIGHT HIP ARTHROSCOPY FOR LABRAL TEAR  ABOUT 2010   2012 also   SHOULDER ARTHROSCOPY  05-06-12   bone spur   TEE WITHOUT CARDIOVERSION N/A 08/05/2020   Procedure: TRANSESOPHAGEAL ECHOCARDIOGRAM (TEE);  Surgeon: Lelon Perla, MD;  Location: [facility];  Service: Cardiovascular;  Laterality: N/A;   TONSILLECTOMY     AS A CHILD   TOTAL HIP ARTHROPLASTY Right 05/13/2012   Procedure: TOTAL HIP ARTHROPLASTY ANTERIOR APPROACH;  Surgeon: Gearlean Alf, MD;  Location: [facility];  Service: Orthopedics;  Laterality: Right;    Social History:  reports that  she has never smoked. She has never used smokeless tobacco. She reports current drug use. She reports that she does not drink alcohol.  Allergies  Allergen Reactions   Amoxicillin Hives    She did ok w ANCEF   Chlorhexidine Gluconate Dermatitis and Hives   Clindamycin Hives   Codeine Hives   Erythromycin Hives    "mycins" in general   Penicillin G Hives    "cillins" in general   Sulfa Antibiotics Nausea And Vomiting and Hives        Levofloxacin Hives   Shellfish Allergy Hives   Decadron [Dexamethasone] Other (See Comments)    Hot flashes, insomnia, "manic" Hot flashes, insomnia, "manic"   Mangifera Indica Hives    papaya   Papaya Derivatives Hives   Betadine [Povidone Iodine] Hives        Clarithromycin Hives   Other Hives     Mango    Povidone-Iodine Hives   Prednisone Anxiety    High blood pressure, flushed, mood changes, heart palpitations High blood pressure, flushed, mood changes, heart palpitations    Family History  Problem Relation Age of Onset   Aneurysm Mother    Bipolar disorder Sister    Aneurysm Maternal Grandmother    Breast cancer Paternal Grandmother 36   Bipolar disorder Grandchild    Anxiety disorder Grandchild    Depression Grandchild    Cancer Neg Hx      Prior to Admission  medications   Medication Sig Start Date End Date Taking? Authorizing Provider  ALPRAZolam Duanne Moron) 1 MG tablet Take 1 mg by mouth at bedtime.  07/04/19  Yes [provider]  apixaban (ELIQUIS) 5 MG TABS tablet Take 1 tablet (5 mg total) by mouth 2 (two) times daily. 04/12/21  Yes Agbor-Etang, Aaron Edelman, MD  busPIRone (BUSPAR) 15 MG tablet Take 15 mg by mouth 2 (two) times daily. 07/20/21  Yes [provider]  CREON 36000-114000 units CPEP capsule Take 2 capsules with the first bite of each meal and 1 capsule with the first bite of each snack 01/27/21  Yes Vanga, Tally Due, MD  diltiazem (CARDIZEM CD) 120 MG 24 hr capsule Take 1 capsule (120 mg total) by mouth daily. 06/27/21  Yes Agbor-Etang, Aaron Edelman, MD  diltiazem (CARDIZEM) 30 MG tablet Take 1 tablet (30 mg total) by mouth every 6 (six) hours as needed. 07/21/21  Yes Kate Sable, MD  flecainide (TAMBOCOR) 50 MG tablet Take 1 tablet (50 mg total) by mouth every 12 (twelve) hours. 08/01/21 08/31/21 Yes Wyvonnia Dusky, MD  linaclotide (LINZESS) 145 MCG CAPS capsule TAKE 1 CAPSULE BY MOUTH DAILY BEFORE BREAKFAST. Patient taking differently: Take 145 mcg by mouth daily as needed. 06/03/21  Yes Lucilla Lame, MD  Menthol, Topical Analgesic, (BENGAY EX) Apply 1 application topically daily as needed (pain).   Yes [provider]  montelukast (SINGULAIR) 10 MG tablet TAKE 1 TABLET BY MOUTH EVERY DAY 03/08/21  Yes Tally Joe T, FNP  morphine (MSIR) 15 MG tablet Take 1 tablet (15 mg total) by mouth every 6 (six) hours as needed for moderate pain or severe pain. Must last 30 days. 07/22/21 08/21/21 Yes Milinda Pointer, MD  naphazoline-pheniramine (VISINE) 0.025-0.3 % ophthalmic solution 1 drop 4 (four) times daily as needed for eye irritation.   Yes [provider]  pantoprazole (PROTONIX) 40 MG tablet TAKE 1 TABLET BY MOUTH TWICE A DAY 06/03/21  Yes Wohl, Darren, MD  polyethylene glycol (MIRALAX / GLYCOLAX) 17 g packet  Take 17 g  by mouth daily as needed.   Yes [provider]  sodium chloride (OCEAN) 0.65 % SOLN nasal spray Place 1 spray into both nostrils daily as needed for congestion ([place]ry[place]).   Yes [provider]  valACYclovir (VALTREX) 1000 MG tablet TAKE TWO TABLETS BY MOUTH TWICE DAILY FOR ONE DAY FEVER BLISTER 06/13/21  Yes Virginia Crews, MD    Physical Exam: Vitals:   08/04/21 1530 08/04/21 1600 08/04/21 1630 08/04/21 1634  BP: 112/77 123/71 121/87   Pulse: (!) 108 (!) 106 (!) 109 88  Resp: 11 (!) '9 12 13  '$ Temp:      SpO2: 98% 99% 98% 100%  Weight:      Height:       Wt Readings from Last 3 Encounters:  08/04/21 69 kg  08/01/21 69.9 kg  07/14/21 69.9 kg   Body mass index is 26.11 kg/m.  General:  Average built, not in obvious distress HENT: Normocephalic, pupils equally reacting to light and accommodation.  No scleral pallor or icterus noted. Oral mucosa is moist.  Chest:  Clear breath sounds.  Diminished breath sounds bilaterally. No crackles or wheezes.  CVS: S1 &S2 heard. No murmur.  Irregular rhythm with tachycardia Abdomen: Soft, nontender, nondistended.  Bowel sounds are heard.  Liver is not palpable, no abdominal mass palpated Extremities: No cyanosis, clubbing with trace edema.  Peripheral pulses are palpable. Psych: Alert, awake and oriented, normal mood CNS:  No cranial nerve deficits.  Power equal in all extremities.   No cerebellar signs.   Skin: Warm and dry.  No rashes noted.  Labs on Admission:   CBC: Recent Labs  Lab 07/29/21 0847 07/30/21 0512 07/31/21 0426 08/01/21 0153 08/04/21 1206  WBC 6.6 6.2 7.3 7.4 8.1  HGB 14.2 13.3 13.5 13.8 16.8*  HCT 42.5 39.6 40.2 40.6 51.8*  MCV 86.6 86.8 87.0 86.6 89.9  PLT 358 341 343 359 425*    Basic Metabolic Panel: Recent Labs  Lab 07/29/21 0847 07/30/21 0512 07/31/21 0426 08/01/21 0153 08/04/21 1206  NA 139 139 138 140 137  K 3.4* 4.0 3.9 3.9 4.7  CL 105 106 108 108 104  CO2 '24 24 23  24 '$ 18*  GLUCOSE 97 106* 109* 125* 152*  BUN '9 11 16 21 10  '$ CREATININE 0.80 0.80 0.88 0.92 0.87  CALCIUM 9.1 9.2 8.9 9.2 10.0  MG 2.3  --   --   --  2.5*    Liver Function Tests: No results for input(s): AST, ALT, ALKPHOS, BILITOT, PROT, ALBUMIN in the last 168 hours. No results for input(s): LIPASE, AMYLASE in the last 168 hours. No results for input(s): AMMONIA in the last 168 hours.  Cardiac Enzymes: No results for input(s): CKTOTAL, CKMB, CKMBINDEX, TROPONINI in the last 168 hours.  BNP (last 3 results) No results for input(s): BNP in the last 8760 hours.  ProBNP (last 3 results) No results for input(s): PROBNP in the last 8760 hours.  CBG: No results for input(s): GLUCAP in the last 168 hours.  Lipase     Component Value Date/Time   LIPASE 29 07/27/2021 1405     Urinalysis    Component Value Date/Time   COLORURINE STRAW (A) 07/27/2021 2151   APPEARANCEUR CLEAR (A) 07/27/2021 2151   APPEARANCEUR Hazy (A) 03/24/2021 1513   LABSPEC 1.004 (L) 07/27/2021 2151   PHURINE 7.0 07/27/2021 2151   GLUCOSEU NEGATIVE 07/27/2021 2151   HGBUR MODERATE (A) 07/27/2021 2151   BILIRUBINUR NEGATIVE 07/27/2021 2151  BILIRUBINUR Negative 03/24/2021 1513   KETONESUR 5 (A) 07/27/2021 2151   PROTEINUR NEGATIVE 07/27/2021 2151   UROBILINOGEN 0.2 05/21/2020 1103   UROBILINOGEN 1.0 05/15/2012 0519   NITRITE NEGATIVE 07/27/2021 2151   LEUKOCYTESUR TRACE (A) 07/27/2021 2151     Drugs of Abuse  No results found for: LABOPIA, COCAINSCRNUR, [place], [place], [facility], [place]    Radiological Exams on Admission: No results found.  EKG: Personally reviewed by me which shows atrial flutter at 120 bpm   Consultant: Cardiology Dr. Rockey Situ  Code Status: Full code  Microbiology none  Antibiotics: None  Family Communication:  Patients' condition and plan of care including tests being ordered have been discussed with the patient  who indicate understanding and agree with the  plan.   Status is: Observation The patient remains OBS appropriate and will d/c before 2 midnights.   Severity of Illness: The appropriate patient status for this patient is OBSERVATION. Observation status is judged to be reasonable and necessary in order to provide the required intensity of service to ensure the patient's safety. The patient's presenting symptoms, physical exam findings, and initial radiographic and laboratory data in the context of their medical condition is felt to place them at decreased risk for further clinical deterioration. Furthermore, it is anticipated that the patient will be medically stable for discharge from the hospital within 2 midnights of admission.   Signed, Flora Lipps, MD [facility] 08/04/2021

## 2021-08-04 NOTE — Telephone Encounter (Signed)
I called Triage 3 times, got no answer and   Secure chatted Nurse and got no response. Please advise

## 2021-08-04 NOTE — ED Notes (Signed)
Pt provided with sandwich box and iced water.

## 2021-08-04 NOTE — Telephone Encounter (Signed)
Called and spoke with pt. Pt is s/p successful cardioversion on 08/01/21 and discharged from [facility].   Pt reports last night after taking a shower she was "back in a fib." Took PRN Diltiazem 30 mg.  Then woke up in the middle of the night with rapid HR, took second Diltiazem 30 mg.   This morning, pt took all AM medications. Noted her HR in the 60s. And pt "felt better."  Soon after pt became "very nauseous" w/o vomit, dizzy and feeling short of breath when standing.  HR currently ranging 110-140 and SOB.  Advised pt go to the emergency room. Pt has family member with her that will drive her to ER at [facility] now.

## 2021-08-04 NOTE — ED Notes (Signed)
This RN unable to get IV access or blood draw in triage. Primary RN aware.

## 2021-08-04 NOTE — Hospital Course (Addendum)
Patient is 61 years old female with past medical history of atrial flutter status post DC cardioversion and ablation in the past, chronic pain syndrome, depression, GERD, hypertension, presented to hospital with complaints of palpitations and near passing out spells and fatigue/weakness for the last 1 to 2 days.  Of note patient was recently admitted to hospital for diarrhea and GI symptoms when she was noted to have atrial flutter and underwent cardioversion at that time with restoration of normal sinus rhythm 08/01/2021.  She was given flecainide and Cardizem on discharge which she was taking.  At night after taking shower patient was very symptomatic with palpitations sweating nausea near passing out spells.  Patient denies any chest pain.  Denies any vomiting  urinary urgency frequency or dysuria has had constipation for the last 5 to 6 days.  Is any fever, chills or rigor.  Denies any cough congestion or sputum production.  Denies any falls or syncope.  Due to the symptoms patient then presented to the ED.  In the ED, patient was noted to have atrial flutter with heart rates in 120s and cardiology was notified from the ED and patient was given Cardizem 10 mg IV x2 and 100 mg of flecainide and morphine 15 mg x 1.  Cardiology has recommended overnight observation and possible cardioversion at this time.  Assessment and plan. Principal Problem:   Atrial flutter (HCC) Active Problems:   OSA (obstructive sleep apnea)   Chronic pain syndrome   Hypertension   Constipation   Symptomatic atrial flutter/fibrillation with rapid ventricular response.  Patient is symptomatic with palpitations and near syncope, generalized weakness and fatigue.  Seen by cardiology Dr. Rockey Situ in the ED.  Flecainide dose has been increased to 100 mg from 50 milligrams.  Patient might need  DC cardioversion tomorrow if she remains symptomatic and does not convert. Cardiology recommends Cardizem drip in the interim.  History of atrial  flutter ablation in 2022.  Patient is currently anticoagulated with Eliquis.  We will continue with Eliquis..  Patient was on Cardizem 120 mg daily at home and has taken today's dose.    Has taken flecainide in the morning and received 100 mg today.  Will likely need 100 mg of flecainide twice a day.  Patient follows up with Dr. Quentin Ore electrophysiology as outpatient. Follow cardiology recommendations on further treatment plan.  Latest 2D echocardiogram with LV ejection fraction of 55 to 60%.  Chronic pain syndrome Patient does have history of degenerative joint disease, cervical spine surgery in the past with total knee replacement and hip issues..  Patient takes morphine 15 mg every 6 hours as outpatient will resume while in the hospital.  She has been on chronic pain medication for several years.  History of depression On buspirone and Xanax as outpatient.  We will continue.  GERD/ peptic ulcer disease Continue Protonix.  Constipation.  Taking MiraLAX as outpatient without much relief.  We will continue with bowel regimen while in the hospital.  Add Colace, Dulcolax suppository.  Patient is also on chronic morphine at home.  Will resume home linaclotide as well  Essential hypertension On Cardizem at home took her Cardizem this morning.  We will continue to monitor.  History of obstructive sleep apnea.  Will resume CPAP from home.

## 2021-08-04 NOTE — ED Provider Notes (Signed)
[facility] Provider Note    Event Date/Time   First MD Initiated Contact with Patient 08/04/21 1155     (approximate)   History   Atrial Fibrillation (RVR)   HPI  [name] is a 61 y.o. female who on review of discharge summary from May 22 had a cardioversion for A-fib  Patient reports a longstanding history of A-fib.  Previous ablation about a year ago  She reports when she is in A-fib she tolerates it very poorly and will often feel lightheaded and nauseated.  She started noticing symptoms of irregular heart rate nausea and lightheadedness last night, continued today.  She took a few doses of her "rescue" dose of diltiazem and has been compliant with her blood thinners as well as new flecainide which she took this morning at 7 AM as per schedule  No chest pain or trouble breathing.  She reports lightheadedness and especially if she stands about and moves she will feel like she is very lightheaded as though she could pass out.  She has felt her heart racing and feels nauseated when this occurs  She reports same is happened to her many times in the past.  Her previous illness of loose stools and diarrhea has resolved completely     Physical Exam   Triage Vital Signs: ED Triage Vitals  Enc Vitals Group     BP 08/04/21 1141 (!) 140/113     Pulse Rate 08/04/21 1141 (!) 120     Resp 08/04/21 1141 18     Temp 08/04/21 1141 98.6 F (37 C)     Temp src --      SpO2 08/04/21 1141 98 %     Weight 08/04/21 1143 152 lb 1.9 oz (69 kg)     Height 08/04/21 1143 '5\' 4"'$  (1.626 m)     Head Circumference --      Peak Flow --      Pain Score 08/04/21 1142 4     Pain Loc --      Pain Edu? --      Excl. in [place]? --     Most recent vital signs: Vitals:   08/04/21 1430 08/04/21 1500  BP: (!) 124/94 111/74  Pulse: 98 (!) 110  Resp: 20 (!) 27  Temp:    SpO2: 99% 97%     General: Awake, no distress.  CV:  Good peripheral perfusion.  Tachycardia  heart rate about 120, irregular no murmurs Resp:  Normal effort.  Clear bilateral Abd:  No distention.  Nontender nondistended Other:  No edema in the lower extremities.  Peripherally well perfused.  Normal mental status   ED Results / Procedures / Treatments   Labs (all labs ordered are listed, but only abnormal results are displayed) Labs Reviewed  CBC - Abnormal; Notable for the following components:      Result Value   RBC 5.76 (*)    Hemoglobin 16.8 (*)    HCT 51.8 (*)    Platelets 425 (*)    All other components within normal limits  BASIC METABOLIC PANEL - Abnormal; Notable for the following components:   CO2 18 (*)    Glucose, Bld 152 (*)    All other components within normal limits  MAGNESIUM - Abnormal; Notable for the following components:   Magnesium 2.5 (*)    All other components within normal limits     EKG  Reviewed interpreted by me at 1145 Heart rate 120 QRS  80 QTc 450 Atrial fibrillation, rapid ventricular response.  Minimal nonspecific T wave abnormality   RADIOLOGY     PROCEDURES:  Critical Care performed: Yes, see critical care procedure note(s)  CRITICAL CARE Performed by: Delman Kitten   Total critical care time: 30 minutes  Critical care time was exclusive of separately billable procedures and treating other patients.  Critical care was necessary to treat or prevent imminent or life-threatening deterioration.  Critical care was time spent personally by me on the following activities: development of treatment plan with patient and/or surrogate as well as nursing, discussions with consultants, evaluation of patient's response to treatment, examination of patient, obtaining history from patient or surrogate, ordering and performing treatments and interventions, ordering and review of laboratory studies, ordering and review of radiographic studies, pulse oximetry and re-evaluation of patient's condition.   Procedures   MEDICATIONS  ORDERED IN ED: Medications  flecainide (TAMBOCOR) tablet 100 mg (100 mg Oral Given 08/04/21 1317)  diltiazem (CARDIZEM) injection 10 mg (10 mg Intravenous Given 08/04/21 1237)  morphine (MSIR) tablet 15 mg (15 mg Oral Given 08/04/21 1317)  diltiazem (CARDIZEM) injection 10 mg (10 mg Intravenous Given 08/04/21 1317)     IMPRESSION / MDM / ASSESSMENT AND PLAN / ED COURSE  I reviewed the triage vital signs and the nursing notes.                              Differential diagnosis includes, but is not limited to, paroxysmal atrial fibrillation, electrolyte abnormality, dehydration, anemia, etc.  Patient is anticoagulated and has a history of A-fib recent cardioversion.  Follows with cardiology.  She has a complex history, and in this case she becomes quite symptomatic when in A-fib.  I have discussed her case with Dr. Rockey Situ due to the complexities of her history of A-fib and he advises to give flecainide 100 mg p.o. now and 10 mg of IV diltiazem and observe for a couple hours to see if she may cardiovert.  QT appears appropriate  Dr. Rockey Situ also advised that if needed would be reasonable to obtain cardiology consult and observation or admission if flecainide and diltiazem do not convert her over the next few hours  Patient's presentation is most consistent with severe exacerbation of chronic illness.  The patient is on the cardiac monitor to evaluate for evidence of arrhythmia and/or significant heart rate changes.  Labs interpreted as slight reduction in CO2.  Slightly elevated magnesium.  Clinical Course as of 08/04/21 1521  Thu Aug 04, 2021  1203 Discussed with Dr. Rockey Situ.  [MQ]    Clinical Course User Index [MQ] Delman Kitten, MD   ----------------------------------------- 12:52 PM on 08/04/2021 ----------------------------------------- Heart rate improving, await flecainide administration.  Continue to observe.  Patient understanding of current plan  Vitals:   08/04/21 1141 08/04/21  1230  BP: (!) 140/113 119/90  Pulse: (!) 120 (!) 110  Resp: 18 13  Temp: 98.6 F (37 C)   SpO2: 98% 99%   ----------------------------------------- 2:57 PM on 08/04/2021 ----------------------------------------- EKG interpreted by me at 1455 Heart rate 105 QRS 95 QTc 450 Atrial flutter, variable conduction.  Rate 105.  No evidence of acute ischemia.  Atrial flutter with improved rate control versus coarse A-fib  ----------------------------------------- 3:20 PM on 08/04/2021 ----------------------------------------- Discussed with Dr. Rockey Situ.  Advises plans for cardioversion tomorrow morning.  Discussed with the patient, she does not feel comfortable going home with ongoing atrial flutter reports she gets  severe symptoms including near syncope nausea.  At this point given multiple doses of IV diltiazem as well as flecainide remains in a flutter, cardiology has arranged for planned cardioversion tomorrow and recommends that it is reasonable to admit her for further cardiac management of her atrial flutter  Patient agreeable with admission  Hospitalist Dr. Louanne Belton consulted, after discussing case will admit for further care and treatment with anticipation of n.p.o. at midnight and cardioversion planned tomorrow with Dr. Rockey Situ  FINAL CLINICAL IMPRESSION(S) / ED DIAGNOSES   Final diagnoses:  Atrial flutter with rapid ventricular response ([place])     Rx / DC Orders   ED Discharge Orders     None        Note:  This document was prepared using Dragon voice recognition software and may include unintentional dictation errors.   Delman Kitten, MD 08/04/21 1549

## 2021-08-05 ENCOUNTER — Encounter: Payer: Self-pay | Admitting: Cardiovascular Disease

## 2021-08-05 ENCOUNTER — Encounter
Admission: EM | Disposition: A | Discharge status: Home / Self Care | Payer: Self-pay | Source: Home / Self Care | Attending: Emergency Medicine

## 2021-08-05 ENCOUNTER — Observation Stay: Payer: Medicare Other | Admitting: Anesthesiology

## 2021-08-05 DIAGNOSIS — G4733 Obstructive sleep apnea (adult) (pediatric): Secondary | ICD-10-CM

## 2021-08-05 DIAGNOSIS — K5900 Constipation, unspecified: Secondary | ICD-10-CM | POA: Diagnosis not present

## 2021-08-05 DIAGNOSIS — Z01818 Encounter for other preprocedural examination: Secondary | ICD-10-CM | POA: Diagnosis not present

## 2021-08-05 DIAGNOSIS — G894 Chronic pain syndrome: Secondary | ICD-10-CM | POA: Diagnosis not present

## 2021-08-05 DIAGNOSIS — I4891 Unspecified atrial fibrillation: Secondary | ICD-10-CM | POA: Diagnosis not present

## 2021-08-05 DIAGNOSIS — I480 Paroxysmal atrial fibrillation: Secondary | ICD-10-CM | POA: Diagnosis not present

## 2021-08-05 DIAGNOSIS — I4892 Unspecified atrial flutter: Secondary | ICD-10-CM | POA: Diagnosis not present

## 2021-08-05 DIAGNOSIS — I10 Essential (primary) hypertension: Secondary | ICD-10-CM | POA: Diagnosis not present

## 2021-08-05 HISTORY — PX: CARDIOVERSION: SHX1299

## 2021-08-05 LAB — CBC
HCT: 41.7 % (ref 36.0–46.0)
Hemoglobin: 14 g/dL (ref 12.0–15.0)
MCH: 29 pg (ref 26.0–34.0)
MCHC: 33.6 g/dL (ref 30.0–36.0)
MCV: 86.3 fL (ref 80.0–100.0)
Platelets: 396 10*3/uL (ref 150–400)
RBC: 4.83 MIL/uL (ref 3.87–5.11)
RDW: 13.1 % (ref 11.5–15.5)
WBC: 7.4 10*3/uL (ref 4.0–10.5)
nRBC: 0 % (ref 0.0–0.2)

## 2021-08-05 LAB — BASIC METABOLIC PANEL
Anion gap: 7 (ref 5–15)
BUN: 13 mg/dL (ref 8–23)
CO2: 24 mmol/L (ref 22–32)
Calcium: 9.2 mg/dL (ref 8.9–10.3)
Chloride: 104 mmol/L (ref 98–111)
Creatinine, Ser: 0.74 mg/dL (ref 0.44–1.00)
GFR, Estimated: >60 mL/min (ref >60)
Glucose, Bld: 121 mg/dL — ABNORMAL HIGH (ref 70–99)
Potassium: 4 mmol/L (ref 3.5–5.1)
Sodium: 135 mmol/L (ref 135–145)

## 2021-08-05 LAB — LIPID PANEL
Cholesterol: 156 mg/dL (ref 0–200)
HDL: 56 mg/dL (ref >40)
LDL Cholesterol: 85 mg/dL (ref 0–99)
Total CHOL/HDL Ratio: 2.8 RATIO
Triglycerides: 74 mg/dL (ref <150)
VLDL: 15 mg/dL (ref 0–40)

## 2021-08-05 LAB — PROTIME-INR
INR: 1.6 — ABNORMAL HIGH (ref 0.8–1.2)
Prothrombin Time: 18.5 seconds — ABNORMAL HIGH (ref 11.4–15.2)

## 2021-08-05 LAB — MAGNESIUM: Magnesium: 2.3 mg/dL (ref 1.7–2.4)

## 2021-08-05 SURGERY — CARDIOVERSION
Anesthesia: General

## 2021-08-05 MED ORDER — DILTIAZEM HCL ER COATED BEADS 120 MG PO CP24
120.0000 mg | ORAL_CAPSULE | Freq: Every day | ORAL | Status: DC
  Administered 2021-08-05: 120 mg via ORAL
  Filled 2021-08-05: qty 1

## 2021-08-05 MED ORDER — AMIODARONE HCL 200 MG PO TABS: 200.0000 mg | ORAL_TABLET | Freq: Two times a day (BID) | ORAL | 0 refills | Status: DC

## 2021-08-05 MED ORDER — CALAMINE EX LOTN: 1.0000 "application " | TOPICAL_LOTION | CUTANEOUS | 0 refills | Status: DC | PRN

## 2021-08-05 MED ORDER — AMIODARONE HCL 200 MG PO TABS
400.0000 mg | ORAL_TABLET | Freq: Two times a day (BID) | ORAL | Status: DC
  Administered 2021-08-05: 400 mg via ORAL
  Filled 2021-08-05: qty 2

## 2021-08-05 MED ORDER — DOCUSATE SODIUM 100 MG PO CAPS: 100.0000 mg | ORAL_CAPSULE | Freq: Two times a day (BID) | ORAL | 0 refills | Status: AC

## 2021-08-05 MED ORDER — PROPOFOL 10 MG/ML IV BOLUS
INTRAVENOUS | Status: DC | PRN
  Administered 2021-08-05: 50 mg via INTRAVENOUS
  Administered 2021-08-05: 20 mg via INTRAVENOUS
  Administered 2021-08-05: 30 mg via INTRAVENOUS
  Administered 2021-08-05: 20 mg via INTRAVENOUS

## 2021-08-05 MED ORDER — CALAMINE EX LOTN
1.0000 "application " | TOPICAL_LOTION | CUTANEOUS | Status: DC | PRN
  Filled 2021-08-05: qty 180

## 2021-08-05 MED ORDER — AMIODARONE HCL 400 MG PO TABS
400.0000 mg | ORAL_TABLET | Freq: Two times a day (BID) | ORAL | 0 refills | Status: DC
Start: 2021-08-05 — End: 2021-12-07

## 2021-08-05 MED ORDER — DIGOXIN 0.25 MG/ML IJ SOLN: 0.2500 mg | INTRAMUSCULAR | Status: DC

## 2021-08-05 MED ORDER — POLYETHYLENE GLYCOL 3350 17 G PO PACK
17.0000 g | PACK | Freq: Two times a day (BID) | ORAL | 0 refills | Status: DC
Start: 2021-08-05 — End: 2021-12-14

## 2021-08-05 NOTE — Discharge Summary (Signed)
[name] GXQ:[id number] DOB: [birth year] DOA: 08/04/2021  PCP: Virginia Crews, MD  Admit date: 08/04/2021 Discharge date: 08/05/2021  Admitted From: home Disposition:  home  Recommendations for Outpatient Follow-up:  Follow up with PCP in 1 week Please obtain BMP/CBC in one week Please follow up  cardiology, Dr. Quentin Ore      Discharge Condition:Stable CODE STATUS:full  Diet recommendation: Heart Healthy  Brief/Interim Summary: Per XKG:YJEHUDJ is 61 years old female with past medical history of atrial flutter status post DC cardioversion and ablation in the past, chronic pain syndrome, depression, GERD, hypertension, presented to hospital with complaints of palpitations and near passing out spells and fatigue/weakness for the last 1 to 2 days.  Of note patient was recently admitted to hospital for diarrhea and GI symptoms when she was noted to have atrial flutter and underwent cardioversion at that time with restoration of normal sinus rhythm 08/01/2021.  She was given flecainide and Cardizem on discharge which she was taking.  At night after taking shower patient was very symptomatic with palpitations sweating nausea near passing out spells.  Patient denies any chest pain.  Denies any vomiting  urinary urgency frequency or dysuria has had constipation for the last 5 to 6 days.  Is any fever, chills or rigor.  Denies any cough congestion or sputum production.  Denies any falls or syncope.  Due to the symptoms patient then presented to the ED.  In the ED, patient was noted to have atrial flutter with heart rates in 120s and cardiology was notified from the ED and patient was given Cardizem 10 mg IV x2 and 100 mg of flecainide and morphine 15 mg x 1.  Cardiology has recommended overnight observation and possible cardioversion . This morning patient had failed DCCV. Was started with amiodarone load and taper to continue as outpatient. Cardiology recommended discharge patient with follow up  with Dr. Quentin Ore.  Discharge Diagnoses:  Principal Problem:   Atrial flutter (HCC) Active Problems:   OSA (obstructive sleep apnea)   Chronic pain syndrome   Hypertension   Constipation    Discharge Instructions  Discharge Instructions     Call MD for:  difficulty breathing, headache or visual disturbances   Complete by: As directed    Call MD for:  persistant dizziness or light-headedness   Complete by: As directed    Diet - low sodium heart healthy   Complete by: As directed    Discharge instructions   Complete by: As directed    Follow-up with Dr. Quentin Ore Take amiodarone 400 twice daily 7 days then down to 200 twice daily.  If you run out please call your cardiologist for refills   Increase activity slowly   Complete by: As directed       Allergies as of 08/05/2021       Reactions   Amoxicillin Hives   She did ok w ANCEF   Chlorhexidine Gluconate Dermatitis, Hives   Clindamycin Hives   Codeine Hives   Erythromycin Hives   "mycins" in general   Penicillin G Hives   "cillins" in general   Sulfa Antibiotics Nausea And Vomiting, Hives       Levofloxacin Hives   Shellfish Allergy Hives   Decadron [dexamethasone] Other (See Comments)   Hot flashes, insomnia, "manic" Hot flashes, insomnia, "manic"   Mangifera Indica Hives   papaya   Papaya Derivatives Hives   Betadine [povidone Iodine] Hives       Clarithromycin Hives   Other Hives  Mango   Povidone-iodine Hives   Prednisone Anxiety   High blood pressure, flushed, mood changes, heart palpitations High blood pressure, flushed, mood changes, heart palpitations        Medication List     STOP taking these medications    flecainide 50 MG tablet Commonly known as: TAMBOCOR       TAKE these medications    ALPRAZolam 1 MG tablet Commonly known as: XANAX Take 1 mg by mouth at bedtime.   amiodarone 400 MG tablet Commonly known as: PACERONE Take 1 tablet (400 mg total) by mouth 2 (two) times  daily for 7 days.   amiodarone 200 MG tablet Commonly known as: Pacerone Take 1 tablet (200 mg total) by mouth 2 (two) times daily for 14 days. Start taking on: August 13, 2021   apixaban 5 MG Tabs tablet Commonly known as: ELIQUIS Take 1 tablet (5 mg total) by mouth 2 (two) times daily.   BENGAY EX Apply 1 application topically daily as needed (pain).   busPIRone 15 MG tablet Commonly known as: BUSPAR Take 15 mg by mouth 2 (two) times daily.   calamine lotion Apply 1 application. topically as needed for itching.   Creon 36000 UNITS Cpep capsule Generic drug: lipase/protease/amylase Take 2 capsules with the first bite of each meal and 1 capsule with the first bite of each snack   diltiazem 120 MG 24 hr capsule Commonly known as: CARDIZEM CD Take 1 capsule (120 mg total) by mouth daily.   diltiazem 30 MG tablet Commonly known as: CARDIZEM Take 1 tablet (30 mg total) by mouth every 6 (six) hours as needed.   docusate sodium 100 MG capsule Commonly known as: COLACE Take 1 capsule (100 mg total) by mouth 2 (two) times daily for 5 days.   Linzess 145 MCG Caps capsule Generic drug: linaclotide TAKE 1 CAPSULE BY MOUTH DAILY BEFORE BREAKFAST. What changed: See the new instructions.   montelukast 10 MG tablet Commonly known as: SINGULAIR TAKE 1 TABLET BY MOUTH EVERY DAY   morphine 15 MG tablet Commonly known as: MSIR Take 1 tablet (15 mg total) by mouth every 6 (six) hours as needed for moderate pain or severe pain. Must last 30 days.   pantoprazole 40 MG tablet Commonly known as: PROTONIX TAKE 1 TABLET BY MOUTH TWICE A DAY   polyethylene glycol 17 g packet Commonly known as: MIRALAX / GLYCOLAX Take 17 g by mouth 2 (two) times daily. What changed:  when to take this reasons to take this   sodium chloride 0.65 % Soln nasal spray Commonly known as: OCEAN Place 1 spray into both nostrils daily as needed for congestion (Dryness).   valACYclovir 1000 MG  tablet Commonly known as: VALTREX TAKE TWO TABLETS BY MOUTH TWICE DAILY FOR ONE DAY FEVER BLISTER   Visine 0.025-0.3 % ophthalmic solution Generic drug: naphazoline-pheniramine 1 drop 4 (four) times daily as needed for eye irritation.        Follow-up Information     Vickie Epley, MD Follow up in 1 week(s).   Specialties: Cardiology, Radiology Contact information: [place] [address] [phone]         Virginia Crews, MD Follow up in 1 week(s).   Specialty: Family Medicine Contact information: [address] [place] [place] [address] [phone]                Allergies  Allergen Reactions   Amoxicillin Hives  She did ok w ANCEF   Chlorhexidine Gluconate Dermatitis and Hives   Clindamycin Hives   Codeine Hives   Erythromycin Hives    "mycins" in general   Penicillin G Hives    "cillins" in general   Sulfa Antibiotics Nausea And Vomiting and Hives        Levofloxacin Hives   Shellfish Allergy Hives   Decadron [Dexamethasone] Other (See Comments)    Hot flashes, insomnia, "manic" Hot flashes, insomnia, "manic"   Mangifera Indica Hives    papaya   Papaya Derivatives Hives   Betadine [Povidone Iodine] Hives        Clarithromycin Hives   Other Hives     Mango    Povidone-Iodine Hives   Prednisone Anxiety    High blood pressure, flushed, mood changes, heart palpitations High blood pressure, flushed, mood changes, heart palpitations    Consultations: cardiology   Procedures/Studies: CT ABDOMEN PELVIS WO CONTRAST  Result Date: 07/28/2021 CLINICAL DATA:  Nausea, vomiting, diarrhea EXAM: CT ABDOMEN AND PELVIS WITHOUT CONTRAST TECHNIQUE: Multidetector CT imaging of the abdomen and pelvis was performed following the standard protocol without IV contrast. RADIATION DOSE REDUCTION: This exam was performed according to the departmental dose-optimization program which includes automated exposure  control, adjustment of the mA and/or kV according to patient size and/or use of iterative reconstruction technique. COMPARISON:  05/01/2020 FINDINGS: Lower chest: No acute abnormality. Hepatobiliary: No focal liver abnormality is seen. Prior cholecystectomy. Pancreas: Unremarkable. No pancreatic ductal dilatation or surrounding inflammatory changes. Spleen: Normal in size without focal abnormality. Adrenals/Urinary Tract: Adrenal glands are unremarkable. Kidneys are normal, without renal calculi, focal lesion, or hydronephrosis. Bladder is unremarkable. Stomach/Bowel: Stomach is within normal limits. No evidence of bowel wall thickening, distention, or inflammatory changes. Appendix is normal. Vascular/Lymphatic: Normal caliber abdominal aorta with mild atherosclerosis. No lymphadenopathy. Reproductive: Status post hysterectomy. No adnexal masses. Other: No abdominal wall hernia or abnormality. No abdominopelvic ascites. Musculoskeletal: No acute osseous abnormality. No aggressive osseous lesion. Right total hip arthroplasty. Posterior spinal fusion from T10 through L4. IMPRESSION: 1. No acute abdominal or pelvic pathology. 2. Aortic Atherosclerosis (ICD10-I70.0). Electronically Signed   By: Kathreen Devoid M.D.   On: 07/28/2021 11:28   DG Chest Port 1 View  Result Date: 07/27/2021 CLINICAL DATA:  Nausea and vomiting and atrial fibrillation, initial encounter EXAM: PORTABLE CHEST 1 VIEW COMPARISON:  01/15/2021 FINDINGS: Cardiac shadow is stable. The lungs are well aerated bilaterally. No focal infiltrate or effusion is seen. Postsurgical changes are noted in the cervical and thoracolumbar spine stable from the prior study. No new focal abnormality is noted. IMPRESSION: No active disease. Electronically Signed   By: Inez Catalina M.D.   On: 07/27/2021 20:23   ECHOCARDIOGRAM COMPLETE  Result Date: 07/31/2021    ECHOCARDIOGRAM REPORT   Patient Name:   [name] Date of Exam: 07/31/2021 Medical Rec #:   [id number]        Height:       64.0 in Accession #:    [id number]       Weight:       154.0 lb Date of Birth:  11-Dec-1960        BSA:          1.751 m Patient Age:    78 years         BP:           112/86 mmHg Patient Gender: F  HR:           74 bpm. Exam Location:  [facility] Procedure: 2D Echo Indications:     Atrial Fibrillation I48.91  History:         Patient has prior history of Echocardiogram examinations, most                  recent 08/05/2020.  Sonographer:     Kathlen Brunswick RDCS Referring Phys:  [id number] Kate Sable Diagnosing Phys: Kate Sable MD IMPRESSIONS  1. Left ventricular ejection fraction, by estimation, is 55 to 60%. Left ventricular ejection fraction by 2D MOD biplane is 55.3 %. The left ventricle has normal function. The left ventricle has no regional wall motion abnormalities. There is mild left ventricular hypertrophy. Left ventricular diastolic parameters are indeterminate.  2. Right ventricular systolic function is normal. The right ventricular size is normal.  3. The mitral valve is normal in structure. Trivial mitral valve regurgitation.  4. The aortic valve is tricuspid. Aortic valve regurgitation is trivial. FINDINGS  Left Ventricle: Left ventricular ejection fraction, by estimation, is 55 to 60%. Left ventricular ejection fraction by 2D MOD biplane is 55.3 %. The left ventricle has normal function. The left ventricle has no regional wall motion abnormalities. The left ventricular internal cavity size was normal in size. There is mild left ventricular hypertrophy. Left ventricular diastolic parameters are indeterminate. Right Ventricle: The right ventricular size is normal. No increase in right ventricular wall thickness. Right ventricular systolic function is normal. Left Atrium: Left atrial size was normal in size. Right Atrium: Right atrial size was normal in size. Pericardium: There is no evidence of pericardial effusion. Mitral Valve: The mitral valve is  normal in structure. Trivial mitral valve regurgitation. Tricuspid Valve: The tricuspid valve is normal in structure. Tricuspid valve regurgitation is mild. Aortic Valve: The aortic valve is tricuspid. Aortic valve regurgitation is trivial. Aortic valve peak gradient measures 2.7 mmHg. Pulmonic Valve: The pulmonic valve was not well visualized. Pulmonic valve regurgitation is not visualized. Aorta: The aortic root and ascending aorta are structurally normal, with no evidence of dilitation. Venous: The inferior vena cava was not well visualized. IAS/Shunts: No atrial level shunt detected by color flow Doppler.  LEFT VENTRICLE PLAX 2D                        Biplane EF (MOD) LVIDd:         4.00 cm         LV Biplane EF:   Left LVIDs:         2.60 cm                          ventricular LV PW:         1.10 cm                          ejection LV IVS:        1.20 cm                          fraction by LVOT diam:     1.90 cm                          2D MOD LV SV:         45  biplane is LV SV Index:   26                               55.3 %. LVOT Area:     2.84 cm                                Diastology                                LV e' medial:    6.31 cm/s LV Volumes (MOD)               LV E/e' medial:  9.5 LV vol d, MOD    52.8 ml       LV e' lateral:   7.07 cm/s A2C:                           LV E/e' lateral: 8.5 LV vol d, MOD    36.2 ml A4C: LV vol s, MOD    24.0 ml A2C: LV vol s, MOD    14.0 ml A4C: LV SV MOD A2C:   28.8 ml LV SV MOD A4C:   36.2 ml LV SV MOD BP:    24.1 ml RIGHT VENTRICLE RV Basal diam:  3.40 cm RV S prime:     9.25 cm/s TAPSE (M-mode): 1.6 cm LEFT ATRIUM             Index        RIGHT ATRIUM           Index LA diam:        3.90 cm 2.23 cm/m   RA Area:     14.00 cm LA Vol (A2C):   48.1 ml 27.47 ml/m  RA Volume:   34.00 ml  19.42 ml/m LA Vol (A4C):   40.0 ml 22.85 ml/m LA Biplane Vol: 45.8 ml 26.16 ml/m  AORTIC VALVE                 PULMONIC VALVE AV Area  (Vmax): 3.02 cm     PV Vmax:       0.71 m/s AV Vmax:        81.40 cm/s   PV Peak grad:  2.0 mmHg AV Peak Grad:   2.7 mmHg LVOT Vmax:      86.80 cm/s LVOT Vmean:     57.500 cm/s LVOT VTI:       0.158 m  AORTA Ao Root diam: 3.20 cm Ao Asc diam:  3.20 cm MITRAL VALVE               TRICUSPID VALVE MV Area (PHT): 4.19 cm    TV Peak grad:   10.6 mmHg MV Decel Time: 181 msec    TV Vmax:        1.62 m/s MV E velocity: 60.00 cm/s                            SHUNTS                            Systemic VTI:  0.16 m  Systemic Diam: 1.90 cm Kate Sable MD Electronically signed by Kate Sable MD Signature Date/Time: 07/31/2021/1:19:13 PM    Final       Subjective: Walked around the hallway, HR went up a little, but no presyncope sx.   Discharge Exam: Vitals:   08/05/21 0930 08/05/21 1201  BP:  120/84  Pulse:  83  Resp:  17  Temp: 98.1 F (36.7 C) 98.3 F (36.8 C)  SpO2:  100%   Vitals:   08/05/21 0900 08/05/21 0905 08/05/21 0930 08/05/21 1201  BP: 109/77 114/84  120/84  Pulse: (!) 106 (!) 114  83  Resp: (!) 23 (!) 24  17  Temp:   98.1 F (36.7 C) 98.3 F (36.8 C)  TempSrc:   Oral Oral  SpO2: 95% 97%  100%  Weight:      Height:        General: Pt is alert, awake, not in acute distress Cardiovascular: RRR, S1/S2 +, no rubs, no gallops Respiratory: CTA bilaterally, no wheezing, no rhonchi Abdominal: Soft, NT, ND, bowel sounds + Extremities: no edema, no cyanosis    The results of significant diagnostics from this hospitalization (including imaging, microbiology, ancillary and laboratory) are listed below for reference.     Microbiology: Recent Results (from the past 240 hour(s))  Resp Panel by RT-PCR (Flu A&B, Covid) Nasopharyngeal Swab     Status: None   Collection Time: 07/27/21  8:09 PM   Specimen: Nasopharyngeal Swab; Nasopharyngeal(NP) swabs in vial transport medium  Result Value Ref Range Status   SARS Coronavirus 2 by RT PCR NEGATIVE  NEGATIVE Final    Comment: (NOTE) SARS-CoV-2 target nucleic acids are NOT DETECTED.  The SARS-CoV-2 RNA is generally detectable in upper respiratory specimens during the acute phase of infection. The lowest concentration of SARS-CoV-2 viral copies this assay can detect is 138 copies/mL. A negative result does not preclude SARS-Cov-2 infection and should not be used as the sole basis for treatment or other patient management decisions. A negative result may occur with  improper specimen collection/handling, submission of specimen other than nasopharyngeal swab, presence of viral mutation(s) within the areas targeted by this assay, and inadequate number of viral copies(<138 copies/mL). A negative result must be combined with clinical observations, patient history, and epidemiological information. The expected result is Negative.  Fact Sheet for Patients:  EntrepreneurPulse.com.au  Fact Sheet for Healthcare Providers:  IncredibleEmployment.be  This test is no t yet approved or cleared by the [place] FDA and  has been authorized for detection and/or diagnosis of SARS-CoV-2 by FDA under an Emergency Use Authorization (EUA). This EUA will remain  in effect (meaning this test can be used) for the duration of the COVID-19 declaration under Section 564(b)(1) of the Act, 21 U.S.C.section 360bbb-3(b)(1), unless the authorization is terminated  or revoked sooner.       Influenza A by PCR NEGATIVE NEGATIVE Final   Influenza B by PCR NEGATIVE NEGATIVE Final    Comment: (NOTE) The Xpert Xpress SARS-CoV-2/FLU/RSV plus assay is intended as an aid in the diagnosis of influenza from Nasopharyngeal swab specimens and should not be used as a sole basis for treatment. Nasal washings and aspirates are unacceptable for Xpert Xpress SARS-CoV-2/FLU/RSV testing.  Fact Sheet for Patients: EntrepreneurPulse.com.au  Fact Sheet for Healthcare  Providers: IncredibleEmployment.be  This test is not yet approved or cleared by the [place] FDA and has been authorized for detection and/or diagnosis of SARS-CoV-2 by FDA under an Emergency Use Authorization (EUA). This EUA  will remain in effect (meaning this test can be used) for the duration of the COVID-19 declaration under Section 564(b)(1) of the Act, 21 U.S.C. section 360bbb-3(b)(1), unless the authorization is terminated or revoked.  Performed at [facility], [address] [place], [place] [address]   Gastrointestinal Panel by PCR , Stool     Status: None   Collection Time: 07/28/21  9:34 AM   Specimen: Stool  Result Value Ref Range Status   Campylobacter species NOT DETECTED NOT DETECTED Final   Plesimonas shigelloides NOT DETECTED NOT DETECTED Final   Salmonella species NOT DETECTED NOT DETECTED Final   Yersinia enterocolitica NOT DETECTED NOT DETECTED Final   Vibrio species NOT DETECTED NOT DETECTED Final   Vibrio cholerae NOT DETECTED NOT DETECTED Final   Enteroaggregative E coli (EAEC) NOT DETECTED NOT DETECTED Final   Enteropathogenic E coli (EPEC) NOT DETECTED NOT DETECTED Final   Enterotoxigenic E coli (ETEC) NOT DETECTED NOT DETECTED Final   Shiga like toxin producing E coli (STEC) NOT DETECTED NOT DETECTED Final   Shigella/Enteroinvasive E coli (EIEC) NOT DETECTED NOT DETECTED Final   Cryptosporidium NOT DETECTED NOT DETECTED Final   Cyclospora cayetanensis NOT DETECTED NOT DETECTED Final   Entamoeba histolytica NOT DETECTED NOT DETECTED Final   Giardia lamblia NOT DETECTED NOT DETECTED Final   Adenovirus F40/41 NOT DETECTED NOT DETECTED Final   Astrovirus NOT DETECTED NOT DETECTED Final   Norovirus GI/GII NOT DETECTED NOT DETECTED Final   Rotavirus A NOT DETECTED NOT DETECTED Final   Sapovirus (I, II, IV, and V) NOT DETECTED NOT DETECTED Final    Comment: Performed at [facility], [address]  [place], [place] [address]  C Difficile Quick Screen w PCR reflex     Status: None   Collection Time: 07/28/21  9:34 AM   Specimen: Stool  Result Value Ref Range Status   C Diff antigen NEGATIVE NEGATIVE Final   C Diff toxin NEGATIVE NEGATIVE Final   C Diff interpretation No C. difficile detected.  Final    Comment: Performed at [facility], [address] [place], [place] [address]  MRSA Next Gen by PCR, Nasal     Status: None   Collection Time: 07/28/21  7:00 PM   Specimen: Nasal Mucosa; Nasal Swab  Result Value Ref Range Status   MRSA by PCR Next Gen NOT DETECTED NOT DETECTED Final    Comment: (NOTE) The GeneXpert MRSA Assay (FDA approved for NASAL specimens only), is one component of a comprehensive MRSA colonization surveillance program. It is not intended to diagnose MRSA infection nor to guide or monitor treatment for MRSA infections. Test performance is not FDA approved in patients less than 69 years old. Performed at [facility], [address], [place], [place] [address]   Urine Culture     Status: Abnormal   Collection Time: 07/29/21  4:54 AM   Specimen: Urine, Clean Catch  Result Value Ref Range Status   Specimen Description   Final    URINE, CLEAN CATCH Performed at [facility], [address] [place], [place] [address]    Special Requests   Final    NONE Performed at [facility], [address] [place], [place] [address]    Culture (A)  Final    <10,000 COLONIES/mL INSIGNIFICANT GROWTH Performed at [facility] [address] [place], [place] [address]    Report Status 07/30/2021 FINAL  Final     Labs: BNP (  last 3 results) No results for input(s): BNP in the last 8760 hours. Basic Metabolic Panel: Recent Labs  Lab 07/30/21 0512 07/31/21 0426 08/01/21 0153 08/04/21 1206 08/05/21 0213  NA 139 138 140 137 135  K 4.0 3.9 3.9 4.7 4.0  CL 106 108 108 104 104  CO2 '24 23 24 '$ 18* 24  GLUCOSE 106* 109* 125*  152* 121*  BUN '11 16 21 10 13  '$ CREATININE 0.80 0.88 0.92 0.87 0.74  CALCIUM 9.2 8.9 9.2 10.0 9.2  MG  --   --   --  2.5* 2.3   Liver Function Tests: No results for input(s): AST, ALT, ALKPHOS, BILITOT, PROT, ALBUMIN in the last 168 hours. No results for input(s): LIPASE, AMYLASE in the last 168 hours. No results for input(s): AMMONIA in the last 168 hours. CBC: Recent Labs  Lab 07/30/21 0512 07/31/21 0426 08/01/21 0153 08/04/21 1206 08/05/21 0213  WBC 6.2 7.3 7.4 8.1 7.4  HGB 13.3 13.5 13.8 16.8* 14.0  HCT 39.6 40.2 40.6 51.8* 41.7  MCV 86.8 87.0 86.6 89.9 86.3  PLT 341 343 359 425* 396   Cardiac Enzymes: No results for input(s): CKTOTAL, CKMB, CKMBINDEX, TROPONINI in the last 168 hours. BNP: Invalid input(s): POCBNP CBG: No results for input(s): GLUCAP in the last 168 hours. D-Dimer No results for input(s): DDIMER in the last 72 hours. Hgb A1c No results for input(s): HGBA1C in the last 72 hours. Lipid Profile Recent Labs    08/05/21 0213  CHOL 156  HDL 56  LDLCALC 85  TRIG 74  CHOLHDL 2.8   Thyroid function studies No results for input(s): TSH, T4TOTAL, T3FREE, THYROIDAB in the last 72 hours.  Invalid input(s): FREET3 Anemia work up No results for input(s): VITAMINB12, FOLATE, FERRITIN, TIBC, IRON, RETICCTPCT in the last 72 hours. Urinalysis    Component Value Date/Time   COLORURINE STRAW (A) 07/27/2021 2151   APPEARANCEUR CLEAR (A) 07/27/2021 2151   APPEARANCEUR Hazy (A) 03/24/2021 1513   LABSPEC 1.004 (L) 07/27/2021 2151   PHURINE 7.0 07/27/2021 2151   GLUCOSEU NEGATIVE 07/27/2021 2151   HGBUR MODERATE (A) 07/27/2021 2151   BILIRUBINUR NEGATIVE 07/27/2021 2151   BILIRUBINUR Negative 03/24/2021 1513   KETONESUR 5 (A) 07/27/2021 2151   PROTEINUR NEGATIVE 07/27/2021 2151   UROBILINOGEN 0.2 05/21/2020 1103   UROBILINOGEN 1.0 05/15/2012 0519   NITRITE NEGATIVE 07/27/2021 2151   LEUKOCYTESUR TRACE (A) 07/27/2021 2151   Sepsis Labs Invalid input(s):  PROCALCITONIN,  WBC,  LACTICIDVEN Microbiology Recent Results (from the past 240 hour(s))  Resp Panel by RT-PCR (Flu A&B, Covid) Nasopharyngeal Swab     Status: None   Collection Time: 07/27/21  8:09 PM   Specimen: Nasopharyngeal Swab; Nasopharyngeal(NP) swabs in vial transport medium  Result Value Ref Range Status   SARS Coronavirus 2 by RT PCR NEGATIVE NEGATIVE Final    Comment: (NOTE) SARS-CoV-2 target nucleic acids are NOT DETECTED.  The SARS-CoV-2 RNA is generally detectable in upper respiratory specimens during the acute phase of infection. The lowest concentration of SARS-CoV-2 viral copies this assay can detect is 138 copies/mL. A negative result does not preclude SARS-Cov-2 infection and should not be used as the sole basis for treatment or other patient management decisions. A negative result may occur with  improper specimen collection/handling, submission of specimen other than nasopharyngeal swab, presence of viral mutation(s) within the areas targeted by this assay, and inadequate number of viral copies(<138 copies/mL). A negative result must be combined with clinical observations, patient history, and  epidemiological information. The expected result is Negative.  Fact Sheet for Patients:  EntrepreneurPulse.com.au  Fact Sheet for Healthcare Providers:  [employer]  This test is no t yet approved or cleared by the [place] FDA and  has been authorized for detection and/or diagnosis of SARS-CoV-2 by FDA under an Emergency Use Authorization (EUA). This EUA will remain  in effect (meaning this test can be used) for the duration of the COVID-19 declaration under Section 564(b)(1) of the Act, 21 U.S.C.section 360bbb-3(b)(1), unless the authorization is terminated  or revoked sooner.       Influenza A by PCR NEGATIVE NEGATIVE Final   Influenza B by PCR NEGATIVE NEGATIVE Final    Comment: (NOTE) The Xpert Xpress  SARS-CoV-2/FLU/RSV plus assay is intended as an aid in the diagnosis of influenza from Nasopharyngeal swab specimens and should not be used as a sole basis for treatment. Nasal washings and aspirates are unacceptable for Xpert Xpress SARS-CoV-2/FLU/RSV testing.  Fact Sheet for Patients: EntrepreneurPulse.com.au  Fact Sheet for Healthcare Providers: [employer]  This test is not yet approved or cleared by the [place] FDA and has been authorized for detection and/or diagnosis of SARS-CoV-2 by FDA under an Emergency Use Authorization (EUA). This EUA will remain in effect (meaning this test can be used) for the duration of the COVID-19 declaration under Section 564(b)(1) of the Act, 21 U.S.C. section 360bbb-3(b)(1), unless the authorization is terminated or revoked.  Performed at [facility], [address] [place], [place] [address]   Gastrointestinal Panel by PCR , Stool     Status: None   Collection Time: 07/28/21  9:34 AM   Specimen: Stool  Result Value Ref Range Status   Campylobacter species NOT DETECTED NOT DETECTED Final   Plesimonas shigelloides NOT DETECTED NOT DETECTED Final   Salmonella species NOT DETECTED NOT DETECTED Final   Yersinia enterocolitica NOT DETECTED NOT DETECTED Final   Vibrio species NOT DETECTED NOT DETECTED Final   Vibrio cholerae NOT DETECTED NOT DETECTED Final   Enteroaggregative E coli (EAEC) NOT DETECTED NOT DETECTED Final   Enteropathogenic E coli (EPEC) NOT DETECTED NOT DETECTED Final   Enterotoxigenic E coli (ETEC) NOT DETECTED NOT DETECTED Final   Shiga like toxin producing E coli (STEC) NOT DETECTED NOT DETECTED Final   Shigella/Enteroinvasive E coli (EIEC) NOT DETECTED NOT DETECTED Final   Cryptosporidium NOT DETECTED NOT DETECTED Final   Cyclospora cayetanensis NOT DETECTED NOT DETECTED Final   Entamoeba histolytica NOT DETECTED NOT DETECTED Final   Giardia lamblia NOT  DETECTED NOT DETECTED Final   Adenovirus F40/41 NOT DETECTED NOT DETECTED Final   Astrovirus NOT DETECTED NOT DETECTED Final   Norovirus GI/GII NOT DETECTED NOT DETECTED Final   Rotavirus A NOT DETECTED NOT DETECTED Final   Sapovirus (I, II, IV, and V) NOT DETECTED NOT DETECTED Final    Comment: Performed at [facility], [address] [place], [place] [address]  C Difficile Quick Screen w PCR reflex     Status: None   Collection Time: 07/28/21  9:34 AM   Specimen: Stool  Result Value Ref Range Status   C Diff antigen NEGATIVE NEGATIVE Final   C Diff toxin NEGATIVE NEGATIVE Final   C Diff interpretation No C. difficile detected.  Final    Comment: Performed at [facility], [address] [place], [place] [address]  MRSA Next Gen by PCR, Nasal     Status: None   Collection Time: 07/28/21  7:00 PM  Specimen: Nasal Mucosa; Nasal Swab  Result Value Ref Range Status   MRSA by PCR Next Gen NOT DETECTED NOT DETECTED Final    Comment: (NOTE) The GeneXpert MRSA Assay (FDA approved for NASAL specimens only), is one component of a comprehensive MRSA colonization surveillance program. It is not intended to diagnose MRSA infection nor to guide or monitor treatment for MRSA infections. Test performance is not FDA approved in patients less than 52 years old. Performed at [facility], [address] [place], [place] [address]   Urine Culture     Status: Abnormal   Collection Time: 07/29/21  4:54 AM   Specimen: Urine, Clean Catch  Result Value Ref Range Status   Specimen Description   Final    URINE, CLEAN CATCH Performed at [facility], [address] [place], [place] [address]    Special Requests   Final    NONE Performed at [facility], [address] [place], [place] [address]    Culture (A)  Final    <10,000 COLONIES/mL INSIGNIFICANT GROWTH Performed at [facility] [address], [place], [place] [address]     Report Status 07/30/2021 FINAL  Final     Time coordinating discharge: Over 30 minutes  SIGNED:   Nolberto Hanlon, MD  [facility] 08/05/2021, 3:18 PM Pager   If 7PM-7AM, please contact night-coverage [email or web address] Password [email or web address]

## 2021-08-05 NOTE — Anesthesia Preprocedure Evaluation (Signed)
Anesthesia Evaluation  Patient identified by MRN, date of birth, ID band Patient awake    Reviewed: Allergy & Precautions, NPO status , Patient's Chart, lab work & pertinent test results  History of Anesthesia Complications (+) PONV and history of anesthetic complications  Airway Mallampati: III  TM Distance: <3 FB Neck ROM: full    Dental  (+) Chipped   Pulmonary asthma , sleep apnea ,    Pulmonary exam normal        Cardiovascular hypertension, (-) angina+ dysrhythmias Atrial Fibrillation  Rhythm:irregular Rate:Normal     Neuro/Psych  Headaches, PSYCHIATRIC DISORDERS  Neuromuscular disease    GI/Hepatic Neg liver ROS, PUD, GERD  Controlled,  Endo/Other  negative endocrine ROS  Renal/GU Renal disease  negative genitourinary   Musculoskeletal   Abdominal   Peds  Hematology negative hematology ROS (+)   Anesthesia Other Findings Past Medical History: No date: Abnormal EKG     Comment:  HX OF INVERTED T WAVES ON EKG, PALPITATIONS, CHEST               PAINS-CARDIAC WORK UP DID NOT SHOW ANY HEART DISEASE No date: AC (acromioclavicular) joint bone spurs 01/04/2017: Acute postoperative pain 08/15/2004: [place] anemia No date: Anemia     Comment:  Iron Infusion-8 yrs ago No date: Anxiety No date: Asthma 08/18/2014: Cephalalgia 08/18/2014: Cervical disc disease     Comment:  Needs neck surgery.  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 05/2019 Cath: Nl cors. EF 55-65%. No date: History of cardioversion 01/04/2015: History of cervical spinal surgery No date: History of kidney stones No date: Hypertension No date: Inverted T wave 02/01/2021: Iron deficiency No date: Mitral regurgitation     Comment:  a. 07/2020 Echo: EF 60-65%, no rwma. Nl RV size/fxn. RVSP              39.53mHg. Mildly to mod dil LA. Mod MR; b. 07/2020 TEE: EF              55-60%. Lambl's excresence. Nl RV size/fxn. Midly dil LA.              No LA/LAA thrombus. Mild MR. 08/18/2014: Narrowing of intervertebral disc space     Comment:  Currently on disability.  04/22/2013: Orthostatic hypotension No date: Pain     Comment:  CHRONIC NECK AND BACK PAIN - LIMITED ROM NECK - S/P               FUSIONS CERVICAL AND LUMBAR 2021: Paroxysmal atrial fibrillation ([place]     Comment:  a. 07/2020 s/p PVI. No date: Pneumonia No date: PONV (postoperative nausea and vomiting)     Comment:  PT GIVES  HX OF N&V AND FEVER WITH SURGERIES YEARS               AGO--BUT NO PROBLEMS WITH MORE RECENT SURGERIES--STATES               NOT MALIGNANT HYPERTHERMIA 05/14/2012: Postop Hyponatremia 05/14/2012: Postoperative anemia due to acute blood loss No date: PTSD (post-traumatic stress disorder) No date: Right foot drop 08/18/2014: Right hip arthralgia     Comment:  Status post surgery of right, and now needs left.  2021: Sleep apnea     Comment:  does not have a cpap No date: Therapeutic opioid-induced constipation (OIC) No date: Typical atrial flutter [facility])  Past Surgical History: No date: ABDOMINAL HYSTERECTOMY 05/2019: afib 10/30/2012: ANTERIOR CERVICAL DECOMP/DISCECTOMY FUSION; N/A     Comment:  Procedure: ACDF C5-6, EXPLORATION AND HARDWARE REMOVAL                C6-7;  Surgeon: Melina Schools, MD;  Location: [place];                Service: Orthopedics;  Laterality: N/A; MAY 2012: ANTERIOR FUSION CERVICAL SPINE     Comment:  AT [facility] 08/06/2020: ATRIAL FIBRILLATION ABLATION; N/A     Comment:  Procedure: ATRIAL FIBRILLATION ABLATION;  Surgeon:               Vickie Epley, MD;  Location: [place] [facility];                Service: Cardiovascular;  Laterality: N/A; 2009: BACK SURGERY     Comment:  LUMBAR FUSION WITH RODS  2008: BREAST BIOPSY; Right     Comment:  benign.- bx/clip 1998: BREAST EXCISIONAL BIOPSY; Left     Comment:  benign 06/2016: BREAST REDUCTION SURGERY; Bilateral No date: CARDIAC ELECTROPHYSIOLOGY MAPPING AND ABLATION 12/03/2019: CARDIOVERSION; N/A     Comment:  Procedure: CARDIOVERSION;  Surgeon: Kate Sable,               MD;  Location: [facility];  Service: Cardiovascular;                Laterality: N/A; 05-06-12: CARPAL TUNNEL RELEASE     Comment:  Right No date: CHOLECYSTECTOMY 01/26/2017: COLONOSCOPY WITH PROPOFOL; N/A     Comment:  Procedure: COLONOSCOPY WITH PROPOFOL;  Surgeon: Lucilla Lame, MD;  Location: [place];  Service:               Endoscopy;  Laterality: N/A; 01/16/2021: COLONOSCOPY WITH PROPOFOL; N/A     Comment:  Procedure: COLONOSCOPY WITH PROPOFOL;  Surgeon: Lin Landsman, MD;  Location: [facility];  Service:               Gastroenterology;  Laterality: N/A; No date: DIAGNOSTIC LAPAROSCOPIES - MULTIPLE FOR ENDOMETRIOSIS 01/26/2017: ESOPHAGEAL DILATION; N/A     Comment:  Procedure: ESOPHAGEAL DILATION;  Surgeon: Lucilla Lame,               MD;  Location: [place];  Service: Endoscopy;               Laterality: N/A; 05/30/2017: ESOPHAGEAL MANOMETRY; N/A     Comment:  Procedure: ESOPHAGEAL MANOMETRY (EM);  Surgeon: Lucilla Lame, MD;  Location: [facility];  Service:               Endoscopy;  Laterality:  N/A; 01/26/2017: ESOPHAGOGASTRODUODENOSCOPY (EGD) WITH PROPOFOL; N/A     Comment:  Procedure: ESOPHAGOGASTRODUODENOSCOPY (EGD) WITH               PROPOFOL;  Surgeon: Lucilla Lame, MD;  Location: [place];  Service: Endoscopy;  Laterality: N/A; 01/30/2020: ESOPHAGOGASTRODUODENOSCOPY (EGD) WITH PROPOFOL; N/A     Comment:  Procedure: ESOPHAGOGASTRODUODENOSCOPY (EGD) WITH               PROPOFOL;  Surgeon: Lucilla Lame, MD;  Location: [place];  Service: Endoscopy;  Laterality: N/A;                sleep apnea COVID + 01-15-20 01/15/2021: ESOPHAGOGASTRODUODENOSCOPY (EGD) WITH PROPOFOL; N/A     Comment:  Procedure: ESOPHAGOGASTRODUODENOSCOPY (EGD) WITH               PROPOFOL;  Surgeon: Lin Landsman, MD;  Location:               [place];  Service: Gastroenterology;  Laterality:               N/A; 09/20/2011: HIP ARTHROSCOPY     Comment:  Procedure: ARTHROSCOPY HIP;  Surgeon: Gearlean Alf,               MD;  Location: [facility];  Service: Orthopedics;                Laterality: Right;  Right Hip Scope with Labral               Debridement 06/10/2019: LEFT HEART CATH AND CORONARY ANGIOGRAPHY; Left     Comment:  Procedure: LEFT HEART CATH AND CORONARY ANGIOGRAPHY;                Surgeon: Nelva Bush, MD;  Location: [place]               [facility];  Service: Cardiovascular;  Laterality: Left; MARCH 2013: NASAL SEPTUM SURGERY     Comment:  IN [place] 05/30/2017: PH IMPEDANCE STUDY; N/A     Comment:  Procedure: [place] IMPEDANCE STUDY;  Surgeon: Lucilla Lame,               MD;  Location: [facility];  Service: Endoscopy;                Laterality: N/A; 01/26/2017: POLYPECTOMY; N/A     Comment:  Procedure: POLYPECTOMY;  Surgeon: Lucilla Lame, MD;                Location: [place];  Service: Endoscopy;                Laterality: N/A; 04/16/2013: POSTERIOR CERVICAL FUSION/FORAMINOTOMY; N/A     Comment:  Procedure:  REMOVAL CERVICAL PLATES AND INTERBODY               CAGE/POSTERIOR CERVICAL SPINAL FUSION C4 - C6/C5               CORPECTOMY/C4 - C6 FUSION WITH ILIAC CREST BONE GRAFT;                Surgeon: Melina Schools, MD;  Location: [place];  Service:  Orthopedics;  Laterality: N/A; No date: RADIOFREQUENCY ABLATION NERVES 05/2016: REDUCTION MAMMAPLASTY; Bilateral ABOUT 2010: RIGHT HIP ARTHROSCOPY FOR LABRAL TEAR     Comment:  2012 also 05-06-12: SHOULDER ARTHROSCOPY     Comment:  bone spur 08/05/2020: TEE WITHOUT CARDIOVERSION; N/A     Comment:  Procedure: TRANSESOPHAGEAL ECHOCARDIOGRAM (TEE);                Surgeon: Lelon Perla, MD;  Location: [facility];               Service: Cardiovascular;  Laterality: N/A; No date: TONSILLECTOMY     Comment:  AS A CHILD 05/13/2012: TOTAL HIP ARTHROPLASTY; Right     Comment:  Procedure: TOTAL HIP ARTHROPLASTY ANTERIOR APPROACH;                Surgeon: Gearlean Alf, MD;  Location: [facility];                Service: Orthopedics;  Laterality: Right;  BMI    Body Mass Index: 26.43 kg/m      Reproductive/Obstetrics negative OB ROS                             Anesthesia Physical  Anesthesia Plan  ASA: 3  Anesthesia Plan: General   Post-op Pain Management:    Induction: Intravenous  PONV Risk Score and Plan: Propofol infusion and TIVA  Airway Management Planned: Natural Airway and Nasal Cannula  Additional Equipment:   Intra-op Plan:   Post-operative Plan:   Informed Consent: I have reviewed the patients History and Physical, chart, labs and discussed the procedure including the risks, benefits and alternatives for the proposed anesthesia with the patient or authorized representative who has indicated his/her understanding and acceptance.     Dental Advisory Given  Plan Discussed with: Anesthesiologist, CRNA and Surgeon  Anesthesia Plan Comments: (Patient consented for risks of anesthesia  including but not limited to:  - adverse reactions to medications - risk of airway placement if required - damage to eyes, teeth, lips or other oral mucosa - nerve damage due to positioning  - sore throat or hoarseness - Damage to heart, brain, nerves, lungs, other parts of body or loss of life  Patient voiced understanding.)        Anesthesia Quick Evaluation

## 2021-08-05 NOTE — Plan of Care (Signed)
  Problem: Clinical Measurements: Goal: Cardiovascular complication will be avoided Outcome: Progressing   

## 2021-08-05 NOTE — CV Procedure (Signed)
Cardioversion procedure note For atrial flutter with RVR  Procedure Details:  Consent: Risks of procedure as well as the alternatives and risks of each were explained to the (patient/caregiver).  Consent for procedure obtained.  Time Out: Verified patient identification, verified procedure, site/side was marked, verified correct patient position, special equipment/implants available, medications/allergies/relevent history reviewed, required imaging and test results available.  Performed  Patient placed on cardiac monitor, pulse oximetry, supplemental oxygen as necessary.   Sedation given: propofol IV, Dr. Bertell Maria Pacer pads placed anterior and posterior chest.   Cardioverted 3 time(s).   Cardioverted at 120 J , 150 J , 200 J. Synchronized biphasic Did not convert to normal sinus rhythm, remained in slow flutter   Evaluation: Findings: Post procedure EKG shows: NSR Complications: None Patient did tolerate procedure well.  Time Spent Directly with the Patient:  37 minutes   Esmond Plants, M.D., Ph.D.

## 2021-08-05 NOTE — Progress Notes (Signed)
Late entry: Approx 2310 entered patient's room with hospital CPAP unit. Communicated with patient that an order for bipap/cpap had been placed. Patient states she does wear CPAP at home but does not want to wear hospital unit. Unit removed from patient's room.

## 2021-08-05 NOTE — Transfer of Care (Signed)
Immediate Anesthesia Transfer of Care Note  Patient: [name]  Procedure(s) Performed: CARDIOVERSION  Patient Location: PACU and Nursing Unit  Anesthesia Type:General  Level of Consciousness: drowsy and patient cooperative  Airway & Oxygen Therapy: Patient Spontanous Breathing and Patient connected to nasal cannula oxygen  Post-op Assessment: Report given to RN and Post -op Vital signs reviewed and stable  Post vital signs: Reviewed and stable  Last Vitals:  Vitals Value Taken Time  BP 81/49 08/05/21 0829  Temp    Pulse    Resp 20 08/05/21 0829  SpO2 100 % 08/05/21 0829    Last Pain:  Vitals:   08/05/21 0733  TempSrc: Oral  PainSc: 0-No pain         Complications: No notable events documented.

## 2021-08-05 NOTE — Progress Notes (Addendum)
Progress Note  Patient Name: [name] Date of Encounter: 08/05/2021  [facility] Cardiologist: Kate Sable, MD   Subjective   Presented to the hospital yesterday atrial flutter, symptomatic with chest tightness, shortness of breath, rapid rate Mild improvement in rate on diltiazem bolus x2,  Diltiazem infusion overnight remains in atrial flutter this morning Cardioversion performed shock x3, unable to restore normal sinus rhythm Atrial flutter ventricular rate in the 90s  Inpatient Medications    Scheduled Meds:  [MAR Hold] ALPRAZolam  1 mg Oral QHS   [MAR Hold] apixaban  5 mg Oral BID   [MAR Hold] busPIRone  15 mg Oral BID   digoxin  0.25 mg Intravenous Q4H   diltiazem  120 mg Oral Daily   [MAR Hold] docusate sodium  100 mg Oral BID   [MAR Hold] linaclotide  145 mcg Oral QAC breakfast   [MAR Hold] lipase/protease/amylase  36,000 Units Oral TID AC   [MAR Hold] montelukast  10 mg Oral Daily   [MAR Hold] pantoprazole  40 mg Oral BID   [MAR Hold] polyethylene glycol  17 g Oral BID   [MAR Hold] sodium chloride flush  3 mL Intravenous Q12H   Continuous Infusions:  [MAR Hold] sodium chloride     sodium chloride 20 mL/hr at 08/05/21 0746   [MAR Hold] diltiazem (CARDIZEM) infusion 5 mg/hr (08/04/21 1648)   PRN Meds: [MAR Hold] sodium chloride, [MAR Hold] acetaminophen, [MAR Hold] bisacodyl, [MAR Hold] morphine, [MAR Hold] naphazoline-pheniramine, [MAR Hold] ondansetron (ZOFRAN) IV, [MAR Hold] sodium chloride, [MAR Hold] sodium chloride flush   Vital Signs    Vitals:   08/05/21 0838 08/05/21 0845 08/05/21 0900 08/05/21 0905  BP: 101/60 96/74 109/77 114/84  Pulse: 87 (!) 103 (!) 106 (!) 114  Resp: 17 (!) 25 (!) 23 (!) 24  Temp:      TempSrc:      SpO2: 100% 96% 95% 97%  Weight:      Height:        Intake/Output Summary (Last 24 hours) at 08/05/2021 0923 Last data filed at 08/05/2021 0828 Gross per 24 hour  Intake 273.85 ml  Output --  Net 273.85 ml       08/05/2021    5:00 AM 08/04/2021   11:43 AM 08/01/2021   11:50 AM  Last 3 Weights  Weight (lbs) 148 lb 2.4 oz 152 lb 1.9 oz 154 lb  Weight (kg) 67.2 kg 69 kg 69.854 kg      Telemetry    Atrial flutter, rate 95 to 100- Personally Reviewed  ECG     - Personally Reviewed  Physical Exam   GEN: No acute distress.   Neck: No JVD Cardiac: Irregularly irregular, no murmurs, rubs, or gallops.  Respiratory: Clear to auscultation bilaterally. GI: Soft, nontender, non-distended  MS: No edema; No deformity. Neuro:  Nonfocal  Psych: Normal affect   Labs    High Sensitivity Troponin:   Recent Labs  Lab 07/27/21 1405 07/27/21 1741  TROPONINIHS 9 9     Chemistry Recent Labs  Lab 08/01/21 0153 08/04/21 1206 08/05/21 0213  NA 140 137 135  K 3.9 4.7 4.0  CL 108 104 104  CO2 24 18* 24  GLUCOSE 125* 152* 121*  BUN '21 10 13  '$ CREATININE 0.92 0.87 0.74  CALCIUM 9.2 10.0 9.2  MG  --  2.5* 2.3  GFRNONAA >60 >60 >60  ANIONGAP '8 15 7    '$ Lipids  Recent Labs  Lab 08/05/21 9628  CHOL 156  TRIG 74  HDL 56  LDLCALC 85  CHOLHDL 2.8    Hematology Recent Labs  Lab 08/01/21 0153 08/04/21 1206 08/05/21 0213  WBC 7.4 8.1 7.4  RBC 4.69 5.76* 4.83  HGB 13.8 16.8* 14.0  HCT 40.6 51.8* 41.7  MCV 86.6 89.9 86.3  MCH 29.4 29.2 29.0  MCHC 34.0 32.4 33.6  RDW 13.0 13.2 13.1  PLT 359 425* 396   Thyroid No results for input(s): TSH, FREET4 in the last 168 hours.  BNPNo results for input(s): BNP, PROBNP in the last 168 hours.  DDimer No results for input(s): DDIMER in the last 168 hours.   Radiology    No results found.  Cardiac Studies   Echocardiogram  1. Left ventricular ejection fraction, by estimation, is 55 to 60%. Left  ventricular ejection fraction by 2D MOD biplane is 55.3 %. The left  ventricle has normal function. The left ventricle has no regional wall  motion abnormalities. There is mild left  ventricular hypertrophy. Left ventricular diastolic  parameters are  indeterminate.   2. Right ventricular systolic function is normal. The right ventricular  size is normal.   3. The mitral valve is normal in structure. Trivial mitral valve  regurgitation.   4. The aortic valve is tricuspid. Aortic valve regurgitation is trivial.   Patient Profile     [name] is a 61 y.o. female with a hx of paroxysmal atrial fibrillation, prior atrial fibrillation ablation followed by EP, recent hospital admission several days ago for atrial fibrillation with RVR, had cardioversion and then started on flecainide who presents with tachypalpitations, orthostasis, general malaise, atrial flutter with RVR on EKG   Assessment & Plan    Atrial fibs/flutter, paroxysmal Prior ablation May 2022 Off amiodarone Followed by EP, Dr. Quentin Ore hospitalization last week for GI pathology, diarrhea, persistent atrial flutter with poorly controlled rate -Normal sinus rhythm restored last week by cardioversion, started on flecainide 50 twice daily with her diltiazem ER 120 -Recurrence of her atrial flutter starting 2 nights ago, symptomatic and difficult to control rate at home with diltiazem  --- Unsuccessful cardioversion this morning, remains in flutter, shock x 3 delivered --- We will recommend additional attempts at rate control and follow-up with the EP for consideration of ablation -We have reached out to Dr. Quentin Ore for his opinion --She prefers not to be on amiodarone again --- Flecainide likely of limited benefit as she remains in flutter For now we will continue diltiazem ER 1 20-180 if tolerated She continues to decline amiodarone, could try digoxin for rate control Continue Eliquis twice daily   ----Addendum: On further discussion with her she is willing to restart amiodarone We will load 400 twice daily 7 days then down to 200 twice daily Continue diltiazem ER 120 Stop flecainide Outpatient follow-up with Dr. Quentin Ore  Numerous discussions  with her concerning various medication and treatment options for her atrial flutter  Total encounter time more than 50 minutes  Greater than 50% was spent in counseling and coordination of care with the patient   For questions or updates, please contact [facility] Please consult [email or web address] for contact info under        Signed, Ida Rogue, MD  08/05/2021, 9:23 AM

## 2021-08-08 ENCOUNTER — Other Ambulatory Visit: Payer: Self-pay

## 2021-08-08 ENCOUNTER — Emergency Department
Admission: EM | Admit: 2021-08-08 | Discharge: 2021-08-09 | Disposition: A | Discharge status: Home / Self Care | Payer: Medicare Other | Attending: Emergency Medicine | Admitting: Emergency Medicine

## 2021-08-08 DIAGNOSIS — Z7901 Long term (current) use of anticoagulants: Secondary | ICD-10-CM | POA: Diagnosis not present

## 2021-08-08 DIAGNOSIS — Z743 Need for continuous supervision: Secondary | ICD-10-CM | POA: Diagnosis not present

## 2021-08-08 DIAGNOSIS — R197 Diarrhea, unspecified: Secondary | ICD-10-CM | POA: Insufficient documentation

## 2021-08-08 DIAGNOSIS — R110 Nausea: Secondary | ICD-10-CM | POA: Diagnosis not present

## 2021-08-08 DIAGNOSIS — R112 Nausea with vomiting, unspecified: Secondary | ICD-10-CM | POA: Insufficient documentation

## 2021-08-08 DIAGNOSIS — E860 Dehydration: Secondary | ICD-10-CM | POA: Diagnosis not present

## 2021-08-08 DIAGNOSIS — R0789 Other chest pain: Secondary | ICD-10-CM | POA: Diagnosis not present

## 2021-08-08 DIAGNOSIS — R10817 Generalized abdominal tenderness: Secondary | ICD-10-CM | POA: Insufficient documentation

## 2021-08-08 DIAGNOSIS — R079 Chest pain, unspecified: Secondary | ICD-10-CM | POA: Diagnosis not present

## 2021-08-08 LAB — CBC WITH DIFFERENTIAL/PLATELET
Abs Immature Granulocytes: 0.03 10*3/uL (ref 0.00–0.07)
Basophils Absolute: 0.1 10*3/uL (ref 0.0–0.1)
Basophils Relative: 1 %
Eosinophils Absolute: 0 10*3/uL (ref 0.0–0.5)
Eosinophils Relative: 0 %
HCT: 45.2 % (ref 36.0–46.0)
Hemoglobin: 15 g/dL (ref 12.0–15.0)
Immature Granulocytes: 0 %
Lymphocytes Relative: 14 %
Lymphs Abs: 1.3 10*3/uL (ref 0.7–4.0)
MCH: 28.7 pg (ref 26.0–34.0)
MCHC: 33.2 g/dL (ref 30.0–36.0)
MCV: 86.6 fL (ref 80.0–100.0)
Monocytes Absolute: 0.3 10*3/uL (ref 0.1–1.0)
Monocytes Relative: 3 %
Neutro Abs: 7.3 10*3/uL (ref 1.7–7.7)
Neutrophils Relative %: 82 %
Platelets: 460 10*3/uL — ABNORMAL HIGH (ref 150–400)
RBC: 5.22 MIL/uL — ABNORMAL HIGH (ref 3.87–5.11)
RDW: 13 % (ref 11.5–15.5)
WBC: 9 10*3/uL (ref 4.0–10.5)
nRBC: 0 % (ref 0.0–0.2)

## 2021-08-08 MED ORDER — LACTATED RINGERS IV BOLUS
1000.0000 mL | Freq: Once | INTRAVENOUS | Status: AC
  Administered 2021-08-08: 1000 mL via INTRAVENOUS

## 2021-08-08 MED ORDER — HYDROMORPHONE HCL 1 MG/ML IJ SOLN
2.0000 mg | Freq: Once | INTRAMUSCULAR | Status: AC
  Administered 2021-08-08: 2 mg via INTRAVENOUS
  Filled 2021-08-08: qty 2

## 2021-08-08 MED ORDER — METOCLOPRAMIDE HCL 5 MG/ML IJ SOLN
10.0000 mg | Freq: Once | INTRAMUSCULAR | Status: AC
Start: 2021-08-08 — End: 2021-08-08
  Administered 2021-08-08: 10 mg via INTRAVENOUS
  Filled 2021-08-08: qty 2

## 2021-08-08 NOTE — ED Provider Notes (Signed)
[facility] Provider Note    Event Date/Time   First MD Initiated Contact with Patient 08/08/21 2332     (approximate)   History   Emesis   HPI  [name] is a 61 y.o. female who presents to the ED for evaluation of Emesis   I reviewed medical DC summary from 5/26.  Admitted for atrial flutter and cardioverted by cardiology, but this was unsuccessful and she was discharged on amiodarone.  Flecainide stopped.  Remains on Eliquis. Otherwise history of chronic pain syndrome on chronic opiates, anxiety and depression  Patient presents to the ED for evaluation of 2 days of recurrent heaving, diarrhea, diffuse myalgias and "feeling like death."  She reports symptoms started yesterday and has had recurrent heaving without actual emesis that she attributes to having had a Nissen fundoplication in the past, but is had recurrent diarrhea.  She reports at least 20 episodes of this.  She reports hurting all over without significant focal pain.  Denies any fevers, syncope.  Does report her heart racing and feeling her atrial fibrillation.  When I am later able to get some more thorough history of the patient on reassessment after she has calmed down, she reports her symptoms started with diarrhea without abdominal pain that she attributes to the various stool softeners and laxatives that she got when she was in the hospital a few days ago.  Reports nonbloody liquid diarrhea, without nausea, then the dry heaving and diarrhea combined.  Physical Exam   Triage Vital Signs: ED Triage Vitals  Enc Vitals Group     BP 08/08/21 2332 139/73     Pulse Rate 08/08/21 2332 95     Resp 08/08/21 2332 18     Temp 08/08/21 2332 98.1 F (36.7 C)     Temp Source 08/08/21 2332 Oral     SpO2 08/08/21 2332 100 %     Weight 08/08/21 2333 148 lb (67.1 kg)     Height 08/08/21 2333 '5\' 4"'$  (1.626 m)     Head Circumference --      Peak Flow --      Pain Score 08/08/21 2333 6     Pain  Loc --      Pain Edu? --      Excl. in [place]? --     Most recent vital signs: Vitals:   08/09/21 0030 08/09/21 0130  BP: (!) 138/95 (!) 128/91  Pulse: (!) 103 91  Resp: 15 16  Temp:    SpO2: 100% 100%    General: Awake.  Regularly heaving into an empty emesis bag.  CV:  Good peripheral perfusion.  Resp:  Normal effort.  Abd:  No distention.  Diffuse mild tenderness without localizing peritoneal features.  No guarding. MSK:  No deformity noted.  Neuro:  No focal deficits appreciated. Other:     ED Results / Procedures / Treatments   Labs (all labs ordered are listed, but only abnormal results are displayed) Labs Reviewed  COMPREHENSIVE METABOLIC PANEL - Abnormal; Notable for the following components:      Result Value   CO2 21 (*)    Glucose, Bld 185 (*)    Alkaline Phosphatase 127 (*)    All other components within normal limits  CBC WITH DIFFERENTIAL/PLATELET - Abnormal; Notable for the following components:   RBC 5.22 (*)    Platelets 460 (*)    All other components within normal limits  URINALYSIS, ROUTINE W REFLEX MICROSCOPIC - Abnormal;  Notable for the following components:   Color, Urine YELLOW (*)    APPearance HAZY (*)    Hgb urine dipstick MODERATE (*)    Ketones, ur 5 (*)    Leukocytes,Ua SMALL (*)    Bacteria, UA RARE (*)    All other components within normal limits  URINE CULTURE  LIPASE, BLOOD  MAGNESIUM  TROPONIN I (HIGH SENSITIVITY)  TROPONIN I (HIGH SENSITIVITY)    EKG Atrial flutter, rate of 104 bpm.  Wandering baseline.  Seems to demonstrate normal axis and intervals.  No clear signs of acute ischemia.  RADIOLOGY   Official radiology report(s): No results found.  PROCEDURES and INTERVENTIONS:  .1-3 Lead EKG Interpretation Performed by: Vladimir Crofts, MD Authorized by: Vladimir Crofts, MD     Interpretation: normal     ECG rate:  98   ECG rate assessment: normal     Rhythm: atrial flutter     Ectopy: none     Conduction: normal     Medications  lactated ringers bolus 1,000 mL (0 mLs Intravenous Stopped 08/09/21 0102)  HYDROmorphone (DILAUDID) injection 2 mg (2 mg Intravenous Given 08/08/21 2349)  metoCLOPramide (REGLAN) injection 10 mg (10 mg Intravenous Given 08/08/21 2348)  morphine (MSIR) tablet 15 mg (15 mg Oral Given 08/09/21 0059)  droperidol (INAPSINE) 2.5 MG/ML injection 1.25 mg (1.25 mg Intravenous Given 08/09/21 0100)  lactated ringers bolus 1,000 mL (1,000 mLs Intravenous New Bag/Given 08/09/21 0130)     IMPRESSION / MDM / ASSESSMENT AND PLAN / ED COURSE  I reviewed the triage vital signs and the nursing notes.  Differential diagnosis includes, but is not limited to, gastroenteritis, dehydration, acute cystitis, opiate withdrawals, diverticulitis, SBO  {Patient presents with symptoms of an acute illness or injury that is potentially life-threatening.  61 year old female presents to the ED with 2 days of recurrent nausea, emesis and diarrhea, with some signs of dehydration but ultimately suitable to outpatient management.  Uncomfortable and dry on presentation.  After replacing her chronic opiates, providing IV fluids and antiemetics she has resolution of her symptoms and tolerates p.o. intake.  Has some mild and diffuse abdominal tenderness without peritoneal features or localizing features.  Blood work is benign with normal CBC and lipase.  Marginal decrease in CO2 is noted and likely related to her dehydration and recurrent output.  Urine with ketonuria suggestive of dehydration and noted to have small leukocytes, but she has no urinary symptoms and I doubt acute cystitis.  We will send her urine for a culture and abstain from antibiotics at this time.  As indicated below, had multiple conversations about CT scanning of her abdomen/pelvis, but she declines indicating she understands the risks and benefits and prefer not to.  We will discharge with antiemetics and return precautions.  Clinical Course as of 08/09/21  0253  Tue Aug 09, 2021  0021 Reassessed.  Patient reports feeling much better and certainly looks better.  I reexamined her and she has some very mild and diffuse tenderness but nothing concerning on her abdominal examination.  We discussed plan of care and pending blood work.  We discussed the possible indication for a CT scan of the abdomen/pelvis but she declines and says she would not want to wait for the blood work and is requesting something to drink. [DS]  9417 Reassessed.  Patient reports feeling better.  We again discussed the possibility of CT scanning.  We discussed her blood work results being quite reassuring.  She declines my offer for CT  scan and says she just wants something else for nausea and something to drink. [DS]  1610 Reassessed. Nausea improved and feeling much better [DS]  0252 Reassessed.  Feeling well.  Tolerating p.o. intake.  Clarified urinary symptoms, she has none.  Discussed dehydration and fairly benign work-up otherwise.  Again offered a CT scan of her abdomen/pelvis but she declines.  Says she is wants to go home with some nausea medicine.  Will discharge with Zofran and return precautions. [DS]    Clinical Course User Index [DS] Vladimir Crofts, MD     FINAL CLINICAL IMPRESSION(S) / ED DIAGNOSES   Final diagnoses:  Nausea vomiting and diarrhea  Dehydration     Rx / DC Orders   ED Discharge Orders          Ordered    ondansetron (ZOFRAN-ODT) 4 MG disintegrating tablet  Every 8 hours PRN        08/09/21 0252             Note:  This document was prepared using Dragon voice recognition software and may include unintentional dictation errors.   Vladimir Crofts, MD 08/09/21 [id number]

## 2021-08-08 NOTE — ED Triage Notes (Signed)
To room 3 via [facility] from home with c/o N/V/D since yesterday. Pt also reports chest pain since yesterday but states abd pain has been worse. Upon EMS arrival to home, pt noted to be in [place] with rate in the 130s-140s, hx of same. Given 2.'5mg'$  Metoprolol and '4mg'$  Zofran. Pt actively vomiting upon arrival to ED.

## 2021-08-09 ENCOUNTER — Other Ambulatory Visit: Payer: Self-pay | Admitting: *Deleted

## 2021-08-09 DIAGNOSIS — K219 Gastro-esophageal reflux disease without esophagitis: Secondary | ICD-10-CM | POA: Diagnosis not present

## 2021-08-09 DIAGNOSIS — I4891 Unspecified atrial fibrillation: Secondary | ICD-10-CM | POA: Diagnosis not present

## 2021-08-09 DIAGNOSIS — E860 Dehydration: Secondary | ICD-10-CM | POA: Diagnosis not present

## 2021-08-09 DIAGNOSIS — R10817 Generalized abdominal tenderness: Secondary | ICD-10-CM | POA: Diagnosis not present

## 2021-08-09 DIAGNOSIS — J45909 Unspecified asthma, uncomplicated: Secondary | ICD-10-CM | POA: Diagnosis not present

## 2021-08-09 DIAGNOSIS — Z7901 Long term (current) use of anticoagulants: Secondary | ICD-10-CM | POA: Diagnosis not present

## 2021-08-09 DIAGNOSIS — R0602 Shortness of breath: Secondary | ICD-10-CM | POA: Diagnosis not present

## 2021-08-09 DIAGNOSIS — R197 Diarrhea, unspecified: Secondary | ICD-10-CM | POA: Diagnosis not present

## 2021-08-09 DIAGNOSIS — Z880 Allergy status to penicillin: Secondary | ICD-10-CM | POA: Diagnosis not present

## 2021-08-09 DIAGNOSIS — E43 Unspecified severe protein-calorie malnutrition: Secondary | ICD-10-CM | POA: Diagnosis not present

## 2021-08-09 DIAGNOSIS — F32A Depression, unspecified: Secondary | ICD-10-CM | POA: Diagnosis not present

## 2021-08-09 DIAGNOSIS — M5136 Other intervertebral disc degeneration, lumbar region: Secondary | ICD-10-CM | POA: Diagnosis not present

## 2021-08-09 DIAGNOSIS — R55 Syncope and collapse: Secondary | ICD-10-CM | POA: Diagnosis not present

## 2021-08-09 DIAGNOSIS — R200 Anesthesia of skin: Secondary | ICD-10-CM | POA: Diagnosis not present

## 2021-08-09 DIAGNOSIS — G894 Chronic pain syndrome: Secondary | ICD-10-CM | POA: Diagnosis not present

## 2021-08-09 DIAGNOSIS — Z882 Allergy status to sulfonamides status: Secondary | ICD-10-CM | POA: Diagnosis not present

## 2021-08-09 DIAGNOSIS — N390 Urinary tract infection, site not specified: Secondary | ICD-10-CM | POA: Diagnosis not present

## 2021-08-09 DIAGNOSIS — R079 Chest pain, unspecified: Secondary | ICD-10-CM | POA: Diagnosis not present

## 2021-08-09 DIAGNOSIS — I4811 Longstanding persistent atrial fibrillation: Secondary | ICD-10-CM | POA: Diagnosis not present

## 2021-08-09 DIAGNOSIS — I4819 Other persistent atrial fibrillation: Secondary | ICD-10-CM | POA: Diagnosis not present

## 2021-08-09 DIAGNOSIS — I10 Essential (primary) hypertension: Secondary | ICD-10-CM | POA: Diagnosis not present

## 2021-08-09 DIAGNOSIS — D509 Iron deficiency anemia, unspecified: Secondary | ICD-10-CM | POA: Diagnosis not present

## 2021-08-09 DIAGNOSIS — I081 Rheumatic disorders of both mitral and tricuspid valves: Secondary | ICD-10-CM | POA: Diagnosis not present

## 2021-08-09 DIAGNOSIS — Z79891 Long term (current) use of opiate analgesic: Secondary | ICD-10-CM | POA: Diagnosis not present

## 2021-08-09 DIAGNOSIS — K8681 Exocrine pancreatic insufficiency: Secondary | ICD-10-CM | POA: Diagnosis not present

## 2021-08-09 DIAGNOSIS — R0789 Other chest pain: Secondary | ICD-10-CM | POA: Diagnosis not present

## 2021-08-09 DIAGNOSIS — R112 Nausea with vomiting, unspecified: Secondary | ICD-10-CM | POA: Diagnosis not present

## 2021-08-09 LAB — URINALYSIS, ROUTINE W REFLEX MICROSCOPIC
Bilirubin Urine: NEGATIVE
Glucose, UA: NEGATIVE mg/dL
Ketones, ur: 5 mg/dL — AB
Nitrite: NEGATIVE
Protein, ur: NEGATIVE mg/dL
Specific Gravity, Urine: 1.011 (ref 1.005–1.030)
pH: 7 (ref 5.0–8.0)

## 2021-08-09 LAB — COMPREHENSIVE METABOLIC PANEL
ALT: 14 U/L (ref 0–44)
AST: 21 U/L (ref 15–41)
Albumin: 4.5 g/dL (ref 3.5–5.0)
Alkaline Phosphatase: 127 U/L — ABNORMAL HIGH (ref 38–126)
Anion gap: 13 (ref 5–15)
BUN: 12 mg/dL (ref 8–23)
CO2: 21 mmol/L — ABNORMAL LOW (ref 22–32)
Calcium: 9.7 mg/dL (ref 8.9–10.3)
Chloride: 102 mmol/L (ref 98–111)
Creatinine, Ser: 0.9 mg/dL (ref 0.44–1.00)
GFR, Estimated: >60 mL/min (ref >60)
Glucose, Bld: 185 mg/dL — ABNORMAL HIGH (ref 70–99)
Potassium: 4.1 mmol/L (ref 3.5–5.1)
Sodium: 136 mmol/L (ref 135–145)
Total Bilirubin: 0.6 mg/dL (ref 0.3–1.2)
Total Protein: 8 g/dL (ref 6.5–8.1)

## 2021-08-09 LAB — TROPONIN I (HIGH SENSITIVITY)
Troponin I (High Sensitivity): 7 ng/L (ref <18)
Troponin I (High Sensitivity): 9 ng/L (ref <18)

## 2021-08-09 LAB — LIPASE, BLOOD: Lipase: 26 U/L (ref 11–51)

## 2021-08-09 LAB — MAGNESIUM: Magnesium: 2.3 mg/dL (ref 1.7–2.4)

## 2021-08-09 MED ORDER — ONDANSETRON HCL 4 MG/2ML IJ SOLN: 4.0000 mg | Freq: Once | INTRAMUSCULAR | Status: DC

## 2021-08-09 MED ORDER — LACTATED RINGERS IV BOLUS
1000.0000 mL | Freq: Once | INTRAVENOUS | Status: AC
  Administered 2021-08-09: 1000 mL via INTRAVENOUS

## 2021-08-09 MED ORDER — DROPERIDOL 2.5 MG/ML IJ SOLN
1.2500 mg | Freq: Once | INTRAMUSCULAR | Status: AC
  Administered 2021-08-09: 1.25 mg via INTRAVENOUS
  Filled 2021-08-09: qty 2

## 2021-08-09 MED ORDER — ONDANSETRON 4 MG PO TBDP: 4.0000 mg | ORAL_TABLET | Freq: Three times a day (TID) | ORAL | 0 refills | Status: DC | PRN

## 2021-08-09 MED ORDER — MORPHINE SULFATE 15 MG PO TABS
15.0000 mg | ORAL_TABLET | Freq: Once | ORAL | Status: AC
  Administered 2021-08-09: 15 mg via ORAL
  Filled 2021-08-09: qty 1

## 2021-08-09 MED ORDER — DILTIAZEM HCL 30 MG PO TABS: 30.0000 mg | ORAL_TABLET | Freq: Four times a day (QID) | ORAL | 0 refills | Status: DC | PRN

## 2021-08-09 NOTE — ED Notes (Signed)
Toileting offered to pt and need for UA explained. Pt states no need to void at this time.

## 2021-08-09 NOTE — ED Notes (Signed)
Pt offered water/snack but declined. C/o ongoing nausea

## 2021-08-09 NOTE — ED Notes (Signed)
Pts family called for ride home and will be in route to ED for pickup.

## 2021-08-09 NOTE — ED Notes (Signed)
Pt assisted off bedpan, cleaned and dried with urine sample collected and sent to Lab.

## 2021-08-09 NOTE — Anesthesia Postprocedure Evaluation (Signed)
Anesthesia Post Note  Patient: [name]  Procedure(s) Performed: CARDIOVERSION  Patient location during evaluation: Specials Recovery Anesthesia Type: General Level of consciousness: awake and alert Pain management: pain level controlled Vital Signs Assessment: post-procedure vital signs reviewed and stable Respiratory status: spontaneous breathing, nonlabored ventilation, respiratory function stable and patient connected to nasal cannula oxygen Cardiovascular status: blood pressure returned to baseline and stable Postop Assessment: no apparent nausea or vomiting Anesthetic complications: no   No notable events documented.   Last Vitals:  Vitals:   08/05/21 0930 08/05/21 1201  BP:  120/84  Pulse:  83  Resp:  17  Temp: 36.7 C 36.8 C  SpO2:  100%    Last Pain:  Vitals:   08/05/21 1201  TempSrc: Oral  PainSc: 3                  Arita Miss

## 2021-08-09 NOTE — Progress Notes (Deleted)
Established patient visit   Patient: [name]   DOB: November 21, 1960   61 y.o. Female  MRN: [id number] Visit Date: 08/11/2021  Today's healthcare provider: Lavon Paganini, MD   No chief complaint on file.  Subjective    HPI  Follow up Hospitalization  Patient was admitted to [facility] on 08/04/21 and discharged on 08/05/21. She was treated for Atrial flutter. presented to hospital with complaints of palpitations and near passing out spells and fatigue/weakness for the last 1 to 2 days. Treatment for this included patient was given Cardizem 10 mg IV x2 and 100 mg of flecainide and morphine 15 mg x 1. patient had failed DCCV. Was started with amiodarone load and taper to continue as outpatient. Amiodarone 400 twice daily 7 days then down to 200 twice daily. Telephone follow up was done on  She reports {excellent/good/fair:19992} compliance with treatment. She reports this condition is {resolved/improved/worsened:23923}.  ----------------------------------------------------------------------------------------- -   Medications: Outpatient Medications Prior to Visit  Medication Sig   ALPRAZolam (XANAX) 1 MG tablet Take 1 mg by mouth at bedtime.    [START ON 08/13/2021] amiodarone (PACERONE) 200 MG tablet Take 1 tablet (200 mg total) by mouth 2 (two) times daily for 14 days.   amiodarone (PACERONE) 400 MG tablet Take 1 tablet (400 mg total) by mouth 2 (two) times daily for 7 days.   apixaban (ELIQUIS) 5 MG TABS tablet Take 1 tablet (5 mg total) by mouth 2 (two) times daily.   busPIRone (BUSPAR) 15 MG tablet Take 15 mg by mouth 2 (two) times daily.   calamine lotion Apply 1 application. topically as needed for itching.   CREON 36000-114000 units CPEP capsule Take 2 capsules with the first bite of each meal and 1 capsule with the first bite of each snack   diltiazem (CARDIZEM CD) 120 MG 24 hr capsule Take 1 capsule (120 mg total) by mouth daily.   diltiazem (CARDIZEM) 30 MG tablet  Take 1 tablet (30 mg total) by mouth every 6 (six) hours as needed.   docusate sodium (COLACE) 100 MG capsule Take 1 capsule (100 mg total) by mouth 2 (two) times daily for 5 days.   linaclotide (LINZESS) 145 MCG CAPS capsule TAKE 1 CAPSULE BY MOUTH DAILY BEFORE BREAKFAST. (Patient taking differently: Take 145 mcg by mouth daily as needed.)   Menthol, Topical Analgesic, (BENGAY EX) Apply 1 application topically daily as needed (pain).   montelukast (SINGULAIR) 10 MG tablet TAKE 1 TABLET BY MOUTH EVERY DAY   morphine (MSIR) 15 MG tablet Take 1 tablet (15 mg total) by mouth every 6 (six) hours as needed for moderate pain or severe pain. Must last 30 days.   naphazoline-pheniramine (VISINE) 0.025-0.3 % ophthalmic solution 1 drop 4 (four) times daily as needed for eye irritation.   ondansetron (ZOFRAN-ODT) 4 MG disintegrating tablet Take 1 tablet (4 mg total) by mouth every 8 (eight) hours as needed.   pantoprazole (PROTONIX) 40 MG tablet TAKE 1 TABLET BY MOUTH TWICE A DAY   polyethylene glycol (MIRALAX / GLYCOLAX) 17 g packet Take 17 g by mouth 2 (two) times daily.   sodium chloride (OCEAN) 0.65 % SOLN nasal spray Place 1 spray into both nostrils daily as needed for congestion (Dryness).   valACYclovir (VALTREX) 1000 MG tablet TAKE TWO TABLETS BY MOUTH TWICE DAILY FOR ONE DAY FEVER BLISTER   No facility-administered medications prior to visit.    Review of Systems  Last CBC Lab Results  Component Value  Date   WBC 9.0 08/08/2021   HGB 15.0 08/08/2021   HCT 45.2 08/08/2021   MCV 86.6 08/08/2021   MCH 28.7 08/08/2021   RDW 13.0 08/08/2021   PLT 460 (H) 38/46/6599   Last metabolic panel Lab Results  Component Value Date   GLUCOSE 185 (H) 08/08/2021   NA 136 08/08/2021   K 4.1 08/08/2021   CL 102 08/08/2021   CO2 21 (L) 08/08/2021   BUN 12 08/08/2021   CREATININE 0.90 08/08/2021   GFRNONAA >60 08/08/2021   CALCIUM 9.7 08/08/2021   PROT 8.0 08/08/2021   ALBUMIN 4.5 08/08/2021    LABGLOB 2.3 01/19/2021   AGRATIO 1.7 01/19/2021   BILITOT 0.6 08/08/2021   ALKPHOS 127 (H) 08/08/2021   AST 21 08/08/2021   ALT 14 08/08/2021   ANIONGAP 13 08/08/2021       Objective    There were no vitals taken for this visit. BP Readings from Last 3 Encounters:  08/09/21 135/89  08/05/21 120/84  08/01/21 (!) 98/56   Wt Readings from Last 3 Encounters:  08/08/21 148 lb (67.1 kg)  08/05/21 148 lb 2.4 oz (67.2 kg)  08/01/21 154 lb (69.9 kg)      Physical Exam  ***  No results found for any visits on 08/11/21.  Assessment & Plan     ***  No follow-ups on file.      {provider attestation***:1}   Lavon Paganini, MD  [facility] [phone] (phone) [phone] (fax)  [place]

## 2021-08-10 DIAGNOSIS — R112 Nausea with vomiting, unspecified: Secondary | ICD-10-CM

## 2021-08-10 DIAGNOSIS — I4891 Unspecified atrial fibrillation: Secondary | ICD-10-CM | POA: Diagnosis not present

## 2021-08-10 LAB — URINE CULTURE: Culture: 30000 — AB

## 2021-08-11 ENCOUNTER — Ambulatory Visit: Payer: Medicare Other | Admitting: Family Medicine

## 2021-08-12 NOTE — Telephone Encounter (Signed)
Left message to call back  

## 2021-08-15 ENCOUNTER — Telehealth: Payer: Self-pay

## 2021-08-15 NOTE — Telephone Encounter (Signed)
Transition Care Management Follow-up Telephone Call Date of discharge and from where: [facility] 08-12-21 Dx: A-fib How have you been since you were released from the hospital? Doing ok  Any questions or concerns? No  Items Reviewed: Did the pt receive and understand the discharge instructions provided? Yes  Medications obtained and verified? Yes  Other? No  Any new allergies since your discharge? No  Dietary orders reviewed? Yes Do you have support at home? Yes   Home Care and Equipment/Supplies: Were home health services ordered? no If so, what is the name of the agency? na  Has the agency set up a time to come to the patient's home? not applicable Were any new equipment or medical supplies ordered?  No What is the name of the medical supply agency? na Were you able to get the supplies/equipment? not applicable Do you have any questions related to the use of the equipment or supplies? No  Functional Questionnaire: (I = Independent and D = Dependent) ADLs: D  Bathing/Dressing- D  Meal Prep- D  Eating- I  Maintaining continence- D  Transferring/Ambulation- I-WHEELCHAIR  Managing Meds- I  Follow up appointments reviewed:  PCP Hospital f/u appt confirmed? No- pt will call to make appt tomorrow - has to coordinate with son or husbands schedule to make appt  [facility] f/u appt confirmed? Yes  Scheduled to see Dr Quentin Ore on 09-14-21 @ 10am- pt plans to cancel this appt and will schedule fu with Cardiologist at [facility] . Are transportation arrangements needed? No  If their condition worsens, is the pt aware to call PCP or go to the Emergency Dept.? Yes Was the patient provided with contact information for the PCP's office or ED? Yes Was to pt encouraged to call back with questions or concerns? Yes

## 2021-08-16 ENCOUNTER — Telehealth: Payer: Self-pay

## 2021-08-16 NOTE — Telephone Encounter (Signed)
Maybe the best thing to do here is to hold a repeat ablation spot and get her in as an overbook sometime in the next few weeks.  Can you call her to discuss timing of the ablation spot hold?  Give me a call with any questions/etc when you speak with her.  She would need a repeat CT scan prior to ablation.  Thanks,  [place] [employer]

## 2021-08-16 NOTE — Telephone Encounter (Signed)
Spoke to the patient this morning and she stated she was unhappy with her hospital visits and that they were sending her home "unstable with heart rates still over 100." Because of this she has chosen to transfer her care to [facility], states she is having a DCCV on 6/7 and an ablation next week. She wishes to cancel all upcoming appt with EP and General Cardiology at [facility].  Will make MD's aware.

## 2021-08-16 NOTE — Telephone Encounter (Signed)
Patient left a voicemail on my phone for call back. Called patient back and left a message for call back

## 2021-08-17 DIAGNOSIS — R55 Syncope and collapse: Secondary | ICD-10-CM | POA: Diagnosis not present

## 2021-08-17 DIAGNOSIS — R0602 Shortness of breath: Secondary | ICD-10-CM | POA: Diagnosis not present

## 2021-08-17 DIAGNOSIS — R079 Chest pain, unspecified: Secondary | ICD-10-CM | POA: Diagnosis not present

## 2021-08-17 DIAGNOSIS — Z880 Allergy status to penicillin: Secondary | ICD-10-CM | POA: Diagnosis not present

## 2021-08-17 DIAGNOSIS — Z882 Allergy status to sulfonamides status: Secondary | ICD-10-CM | POA: Diagnosis not present

## 2021-08-17 DIAGNOSIS — R110 Nausea: Secondary | ICD-10-CM | POA: Diagnosis not present

## 2021-08-17 DIAGNOSIS — I4819 Other persistent atrial fibrillation: Secondary | ICD-10-CM | POA: Diagnosis not present

## 2021-08-17 DIAGNOSIS — I10 Essential (primary) hypertension: Secondary | ICD-10-CM | POA: Diagnosis not present

## 2021-08-17 DIAGNOSIS — Z881 Allergy status to other antibiotic agents status: Secondary | ICD-10-CM | POA: Diagnosis not present

## 2021-08-18 DIAGNOSIS — I4891 Unspecified atrial fibrillation: Secondary | ICD-10-CM | POA: Diagnosis not present

## 2021-08-18 DIAGNOSIS — N3001 Acute cystitis with hematuria: Secondary | ICD-10-CM | POA: Diagnosis not present

## 2021-08-30 ENCOUNTER — Other Ambulatory Visit: Payer: Self-pay | Admitting: Family Medicine

## 2021-08-30 ENCOUNTER — Other Ambulatory Visit: Payer: Self-pay | Admitting: Gastroenterology

## 2021-08-30 DIAGNOSIS — J45909 Unspecified asthma, uncomplicated: Secondary | ICD-10-CM

## 2021-08-30 DIAGNOSIS — Z79899 Other long term (current) drug therapy: Secondary | ICD-10-CM | POA: Diagnosis not present

## 2021-08-30 DIAGNOSIS — R9431 Abnormal electrocardiogram [ECG] [EKG]: Secondary | ICD-10-CM | POA: Diagnosis not present

## 2021-08-30 DIAGNOSIS — I4891 Unspecified atrial fibrillation: Secondary | ICD-10-CM | POA: Diagnosis not present

## 2021-08-30 DIAGNOSIS — I4811 Longstanding persistent atrial fibrillation: Secondary | ICD-10-CM | POA: Diagnosis not present

## 2021-08-30 DIAGNOSIS — I252 Old myocardial infarction: Secondary | ICD-10-CM | POA: Diagnosis not present

## 2021-08-30 DIAGNOSIS — I10 Essential (primary) hypertension: Secondary | ICD-10-CM | POA: Diagnosis not present

## 2021-08-31 DIAGNOSIS — I4811 Longstanding persistent atrial fibrillation: Secondary | ICD-10-CM | POA: Diagnosis not present

## 2021-09-03 IMAGING — MR MR CERVICAL SPINE W/O CM
4 of 5 series · 30 of 48 positions shown · non-contrast
Comparison: Cervical spine radiographs 08/20/2019. CT neck
04/16/2013

CLINICAL DATA: Chronic neck pain.  Prior cervical fusion.

EXAM:
MRI CERVICAL SPINE WITHOUT CONTRAST
TECHNIQUE: Multiplanar, multisequence MR imaging of the cervical spine was
performed. No intravenous contrast was administered.

[Series 4: T2 · sagittal · 3.0mm · 0.66mm/px · 8 of 15 slices shown (1 of 2)]
[im 1/15]
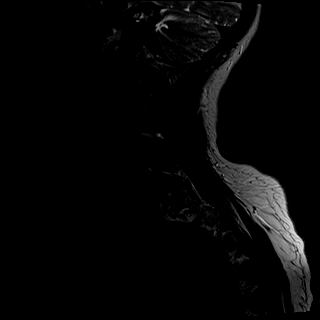
[im 3/15]
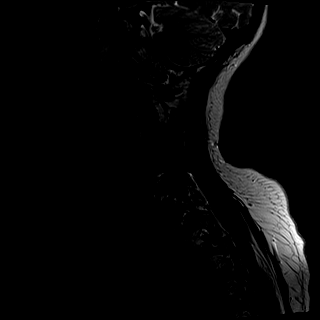
[im 5/15]
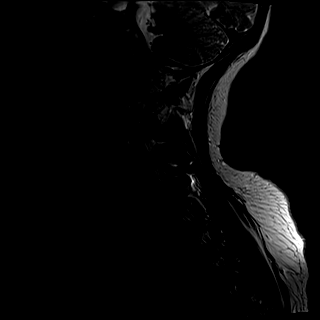
[im 7/15]
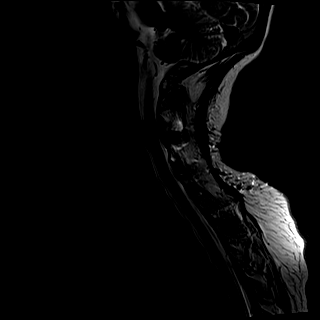
[im 9/15]
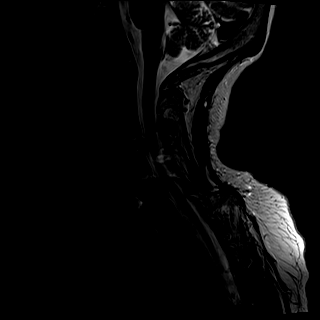
[im 11/15]
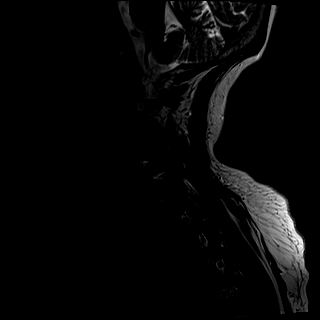
[im 13/15]
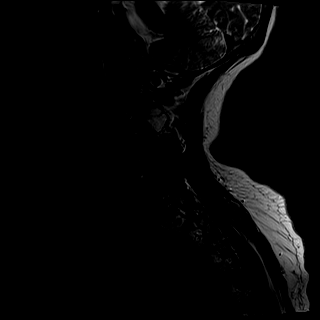
[im 15/15]
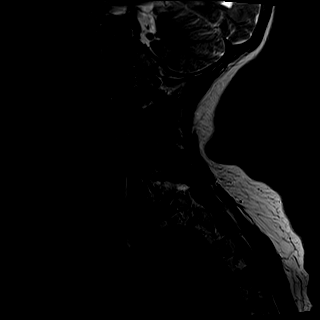

[Series 5: T1 · sagittal · 3.0mm · 0.41mm/px · 7 of 15 slices shown]
[im 1/15]
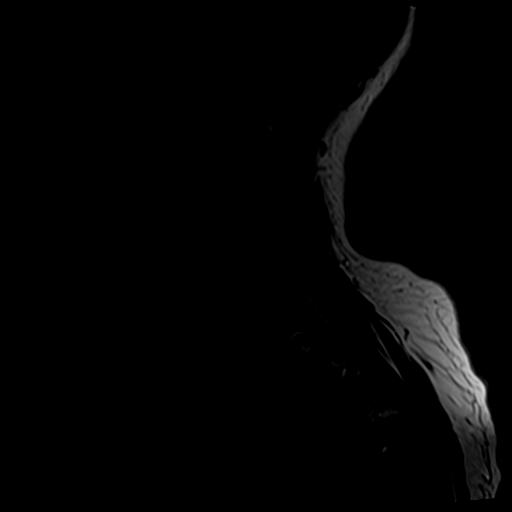
[im 3/15]
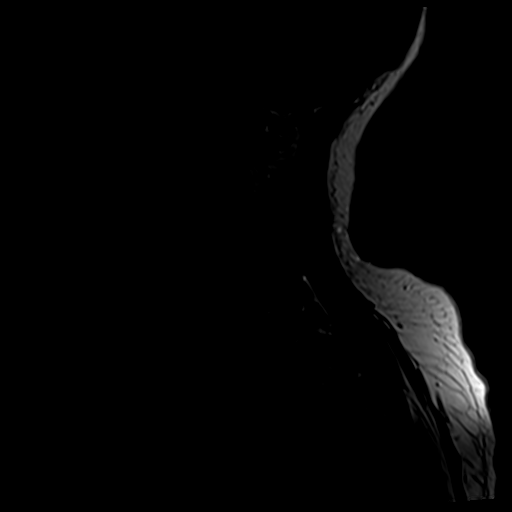
[im 5/15]
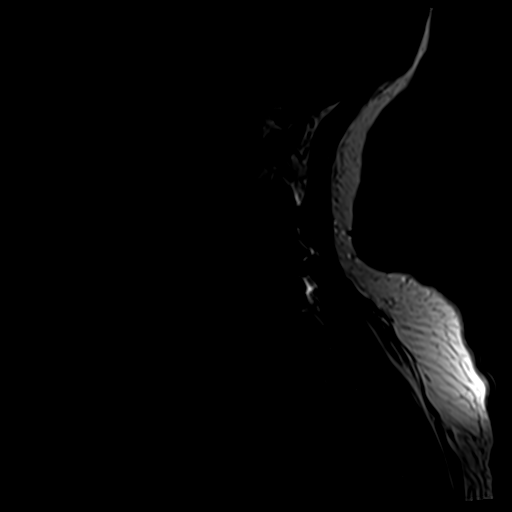
[im 8/15]
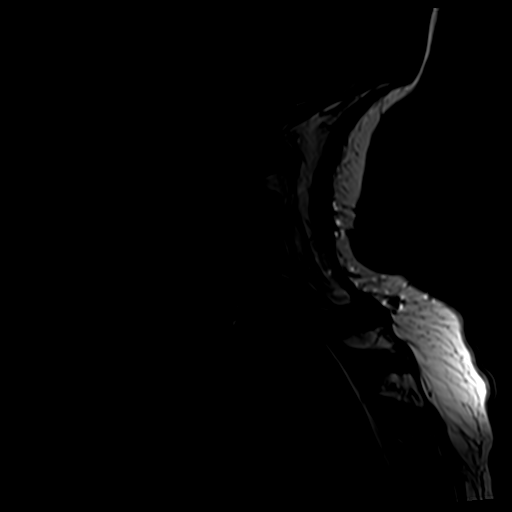
[im 10/15]
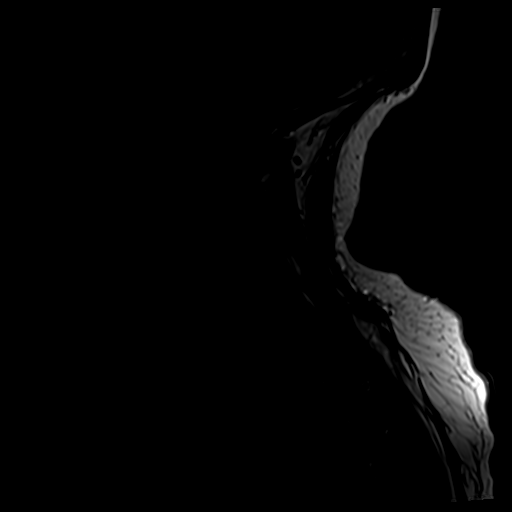
[im 12/15]
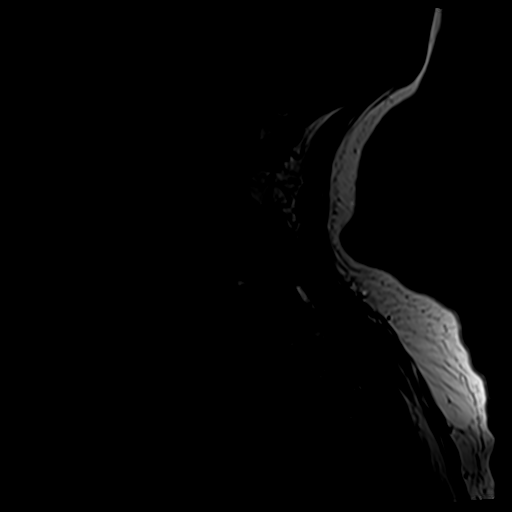
[im 15/15]
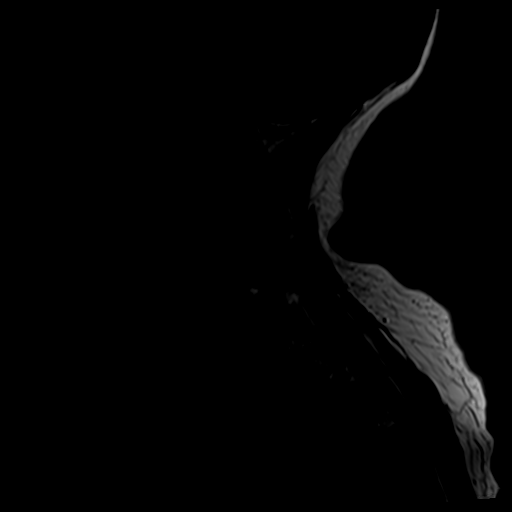

[Series 6: tir sag · sagittal · 3.0mm · 0.41mm/px · 6 of 15 slices shown]
[im 1/15]
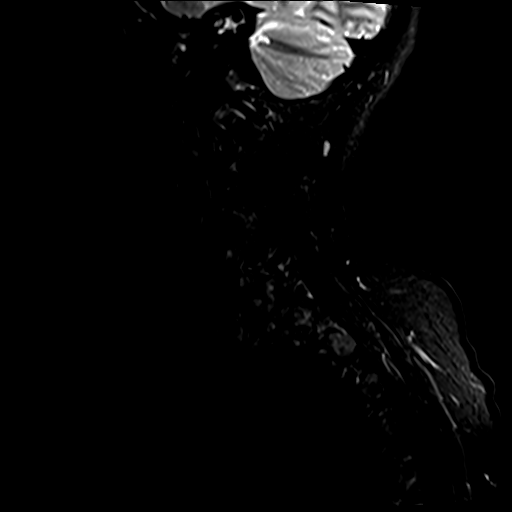
[im 3/15]
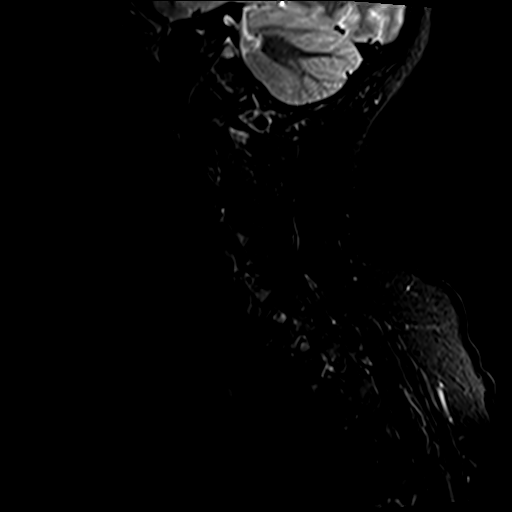
[im 5/15]
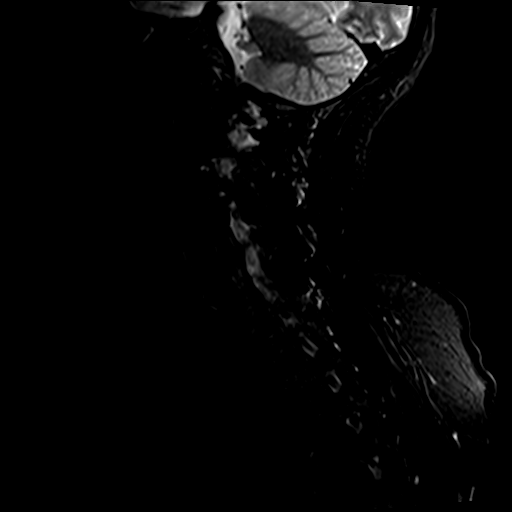
[im 8/15]
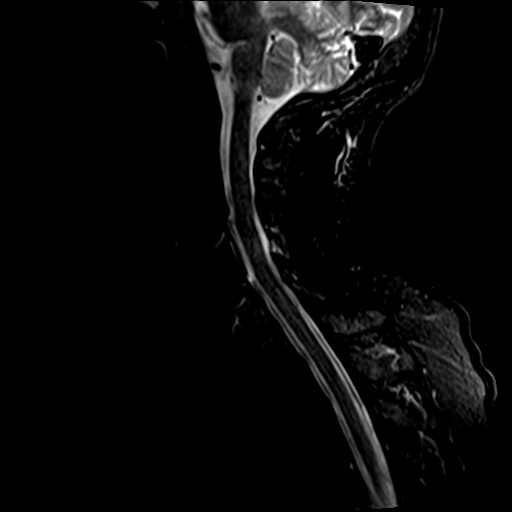
[im 10/15]
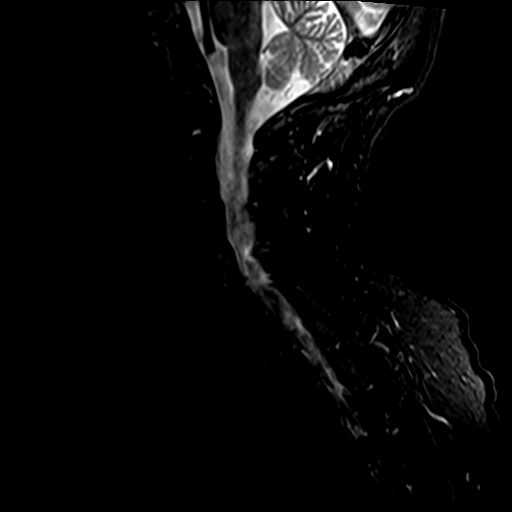
[im 12/15]
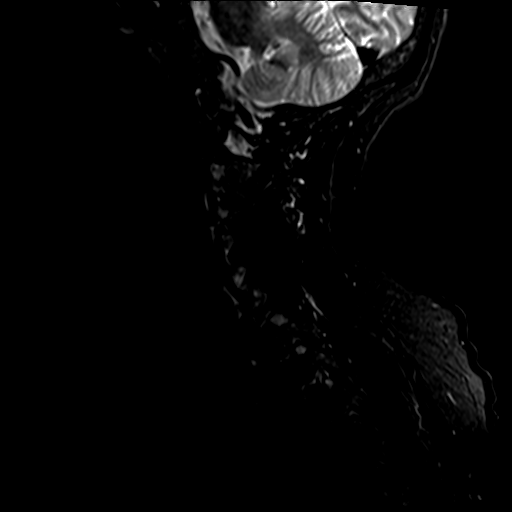

[Series 8: T2 · axial · 3.0mm · 0.70mm/px · z∈[-61,+34]mm · 9 of 28 slices shown (2 of 2)]
[im 1/28]
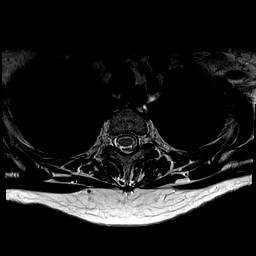
[im 5/28]
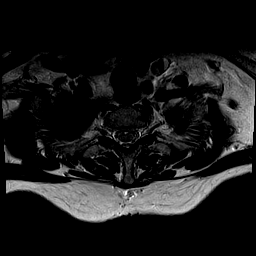
[im 10/28]
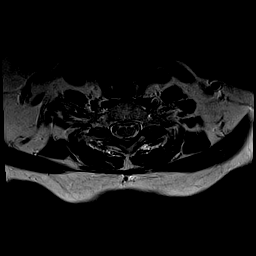
[im 12/28]
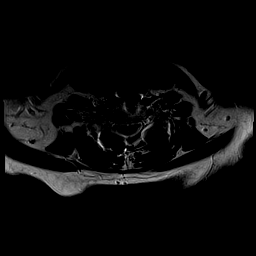
[im 14/28]
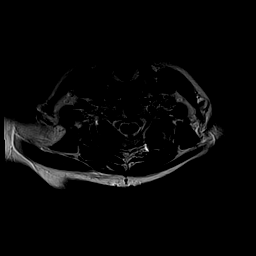
[im 16/28]
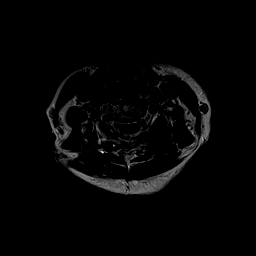
[im 19/28]
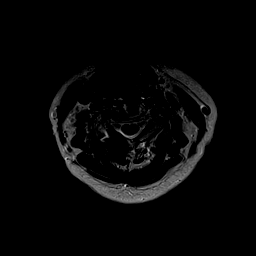
[im 23/28]
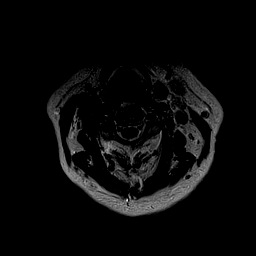
[im 28/28]
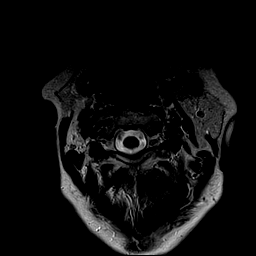

[30 of 48 positions shown; findings below may reference images not displayed]

FINDINGS: Alignment: 2 mm retrolisthesis C3-4.

Vertebrae: Negative for fracture or mass.

Cord: Normal signal and morphology.

Posterior Fossa, vertebral arteries, paraspinal tissues: Negative

Disc levels:

C2-3: Negative

C3-4: Mild disc degeneration. Mild uncinate spurring. Mild right
foraminal narrowing due to spurring.

C4-5: Corpectomy at C5 with ACDF C4 through C6 with anterior plate.
Posterior screw and rod fusion on the right from C4 through C6 and
on the left at C5-6. Spinal canal well decompressed. Neural foramina
patent bilaterally

C5-6: ACDF with posterior fusion.  Negative for stenosis

C7-T1: Mild disc and mild facet degeneration. Negative for stenosis.
IMPRESSION: Mild anterolisthesis C3-4. Mild right foraminal narrowing due to
spurring

Corpectomy at C5 with ACDF C5-C6. Posterior fusion C4 through C6 on
the right and C5-6 on the left.

No significant spinal or foraminal stenosis at C4-5 or C5-6.

Mild disc and facet degeneration C7-T1 without stenosis.

## 2021-09-08 ENCOUNTER — Telehealth: Payer: Self-pay

## 2021-09-08 MED ORDER — OMEPRAZOLE 40 MG PO CPDR: 40.0000 mg | DELAYED_RELEASE_CAPSULE | Freq: Two times a day (BID) | ORAL | 2 refills | Status: DC

## 2021-09-08 NOTE — Telephone Encounter (Signed)
Patient states she stayed in A fib for over a month and just had a heart ablation. She states she has been having worse gerd symptoms, Dry heaving, vomiting but it is just foam she states. She states she is also having esophageal spasms and anything she eat or drinks it feels like she is swallowing a rock. She is having mid stomach pain. She has appointment is November but states she can not wait that long

## 2021-09-08 NOTE — Telephone Encounter (Signed)
Recommend to switch from Protonix to omeprazole or esomeprazole 40 mg p.o. twice daily about 30 minutes before breakfast and before dinner or Dexilant 60 mg daily for 3 months  RV

## 2021-09-08 NOTE — Telephone Encounter (Signed)
Change to omeprazole and sent to the pharmacy

## 2021-09-08 NOTE — Addendum Note (Signed)
Addended by: Ulyess Blossom L on: 09/08/2021 04:55 PM   Modules accepted: Orders

## 2021-09-10 DIAGNOSIS — M542 Cervicalgia: Secondary | ICD-10-CM | POA: Insufficient documentation

## 2021-09-10 NOTE — Progress Notes (Signed)
PROVIDER NOTE: Information contained herein reflects review and annotations entered in association with encounter. Interpretation of such information and data should be left to medically-trained personnel. Information provided to patient can be located elsewhere in the medical record under "Patient Instructions". Document created using STT-dictation technology, any transcriptional errors that may result from process are unintentional.    Patient: [name]  Service Category: E/M  Provider: Gaspar Cola, MD  DOB: 03-27-1960  DOS: 09/12/2021  Specialty: Interventional Pain Management  MRN: [id number]  Setting: Ambulatory outpatient  PCP: Shelby Crews, MD  Type: Established Patient    Referring Provider: Virginia Crews, MD  Location: Office  Delivery: Face-to-face     HPI  [name], a 61 y.o. year old female, is here today because of her Chronic pain syndrome [G89.4]. [name] primary complain today is No chief complaint on file. Last encounter: My last encounter with her was on 06/13/2021. Pertinent problems: 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); Chronic hip pain (2ry area of Pain) (Bilateral) (L>R); Chronic shoulder arthropathy (Left); Trigger point of shoulder region  (Left); Enthesopathy of hip region (Left); Groin pain, chronic, left; Other intervertebral disc degeneration, lumbar region; History of lumbar surgery; DDD (degenerative disc disease), lumbosacral; Neuropathic pain; Neurogenic pain; Spondylosis without myelopathy or radiculopathy, lumbosacral region; Chronic musculoskeletal pain; Cervical radiculitis (C4,C5,C8) (Left); DDD (degenerative disc disease), cervical; Foraminal stenosis of cervical region (C3-4) (Right); 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); Abnormal MRI, shoulder (03/20/2017); Chronic neck pain with history of cervical spinal surgery; Enthesopathy of shoulder (Left); Osteoarthritis of shoulder (Left); Symptoms referable to shoulder joint; Tendinopathy of rotator cuff (Left); Sprain of supraspinatus muscle or tendon, sequela (Left); Subdeltoid bursitis of shoulder (Left); Subacromial bursitis of shoulder (Left); Chronic shoulder pain (Left); Abnormal MRI, cervical spine (05/12/2020); Chronic upper back pain; Chronic shoulder pain (Right); Osteoarthritis of shoulder (Right); Arthropathy of shoulder (Right); C7 radiculopathy (Right); Cervical radiculopathy at C8 (Right); Muscle weakness of upper extremity (Right); Weakness of upper extremity (Right); Cervical fusion syndrome; Failed back syndrome of cervical spine; and Cervicalgia on their pertinent problem list. Pain Assessment: Severity of   is reported as a  /10. Location:    / . Onset:  . Quality:  . Timing:  . Modifying factor(s):  Marland Kitchen Vitals:  vitals were not taken for this visit.   Reason for encounter: medication management. ***  UDS ordered today.   RTCB: 12/20/2021  Pharmacotherapy Assessment  Analgesic: Morphine IR 15 mg 1 tab p.o. every 6 hours (60 mg/day of morphine) MME/day: 60 mg/day.   Monitoring: [place] PMP: PDMP reviewed during this encounter.       Pharmacotherapy: No side-effects or adverse reactions reported. Compliance: No problems  identified. Effectiveness: Clinically acceptable.  No notes on file  UDS:  Summary  Date Value Ref Range Status  08/18/2020 Note  Final    Comment:    ==================================================================== [employer] [facility] 13 (MW) ==================================================================== Test  Result       Flag       Units  Drug Present and Declared for Prescription Verification   Alprazolam                     170          EXPECTED   ng/mg creat   Alpha-hydroxyalprazolam        484          EXPECTED   ng/mg creat    Source of alprazolam is a scheduled prescription medication. Alpha-    hydroxyalprazolam is an expected metabolite of alprazolam.    Morphine                       >9524        EXPECTED   ng/mg creat   Normorphine                    757          EXPECTED   ng/mg creat    Potential sources of large amounts of morphine in the absence of    codeine include administration of morphine or use of heroin.     Normorphine is an expected metabolite of morphine.    Hydromorphone                  650          EXPECTED   ng/mg creat    Hydromorphone may be present as a metabolite of morphine;    concentrations of hydromorphone rarely exceed 5% of the morphine    concentration when this is the source of hydromorphone.  ==================================================================== Test                      Result    Flag   Units      Ref Range   Creatinine              105              mg/dL      >=20 ==================================================================== Declared Medications:  The flagging and interpretation on this report are based on the  following declared medications.  Unexpected results may arise from  inaccuracies in the declared medications.   **Note: The testing scope of this panel includes these medications:   Alprazolam (Xanax)  Morphine (MSIR)   **Note: The testing scope of this panel does  not include the  following reported medications:   Acetaminophen (Tylenol)  Amiodarone  Apixaban (Eliquis)  Buspirone (Buspar)  Dicyclomine (Bentyl)  Eye Drops  Linaclotide (Linzess)  Menthol  Montelukast (Singulair)  Ondansetron (Zofran)  Pantoprazole (Protonix)  Polyethylene Glycol (MiraLAX)  Sodium Chloride  Valacyclovir (Valtrex) ==================================================================== For clinical consultation, please call [phone]. ====================================================================      ROS  Constitutional: Denies any fever or chills Gastrointestinal: No reported hemesis, hematochezia, vomiting, or acute GI distress Musculoskeletal: Denies any acute onset joint swelling, redness, loss of ROM, or weakness Neurological: No reported episodes of acute onset apraxia, aphasia, dysarthria, agnosia, amnesia, paralysis, loss of coordination, or loss of consciousness  Medication Review  ALPRAZolam, Menthol (Topical Analgesic), amiodarone, apixaban, busPIRone, calamine, diltiazem, linaclotide, lipase/protease/amylase, montelukast, morphine, naphazoline-pheniramine, omeprazole, ondansetron, polyethylene glycol, sodium chloride, and valACYclovir  History Review  Allergy: [name] is allergic to amoxicillin, chlorhexidine gluconate, clindamycin, codeine, erythromycin, penicillin g, sulfa antibiotics, levofloxacin, shellfish allergy, decadron [dexamethasone], mangifera indica, papaya derivatives, betadine [povidone  iodine], clarithromycin, other, povidone-iodine, and prednisone. Drug: [name]  reports current drug use. Alcohol:  reports no history of alcohol use. Tobacco:  reports that she has never smoked. She has never used smokeless tobacco. Social: [name]  reports that she has never smoked. She has never used smokeless tobacco. She reports current drug use. She reports that she does not drink alcohol. Medical:  has a past medical history  of Abnormal EKG, AC (acromioclavicular) joint bone spurs, Acute postoperative pain (01/04/2017), Addison anemia (08/15/2004), Anemia, Anxiety, Asthma, Cephalalgia (08/18/2014), Cervical disc disease (08/18/2014), Chronic headaches, Chronic, continuous use of opioids, DDD (degenerative disc disease), Depression, Dissociative disorder, Dizziness (04/22/2013), Duodenal ulcer with hemorrhage and perforation ([place]) (04/27/2003), Foot drop, right, GERD (gastroesophageal reflux disease), H/O arthrodesis (08/18/2014), Headache(784.0), History of blood transfusion, History of cardiac cath, History of cardioversion, History of cervical spinal surgery (01/04/2015), History of kidney stones, Hypertension, Inverted T wave, Iron deficiency (02/01/2021), Mitral regurgitation, Narrowing of intervertebral disc space (08/18/2014), Orthostatic hypotension (04/22/2013), Pain, Paroxysmal atrial fibrillation ([place]) (2021), Pneumonia, PONV (postoperative nausea and vomiting), Postop Hyponatremia (05/14/2012), Postoperative anemia due to acute blood loss (05/14/2012), PTSD (post-traumatic stress disorder), Right foot drop, Right hip arthralgia (08/18/2014), Sleep apnea (2021), Therapeutic opioid-induced constipation ([place]), and Typical atrial flutter ([place]). Surgical: [name]  has a past surgical history that includes RIGHT HIP ARTHROSCOPY FOR LABRAL TEAR (ABOUT 2010); Anterior fusion cervical spine (MAY 2012); Nasal septum surgery (MARCH 2013); Back surgery (2009); Tonsillectomy; Abdominal hysterectomy; DIAGNOSTIC LAPAROSCOPIES - MULTIPLE FOR ENDOMETRIOSIS; Cholecystectomy; Hip arthroscopy (09/20/2011); Shoulder arthroscopy (05-06-12); Carpal tunnel release (05-06-12); Total hip arthroplasty (Right, 05/13/2012); Anterior cervical decomp/discectomy fusion (N/A, 10/30/2012); Posterior cervical fusion/foraminotomy (N/A, 04/16/2013); Breast reduction surgery (Bilateral, 06/2016); Colonoscopy with propofol (N/A, 01/26/2017); Esophagogastroduodenoscopy  (egd) with propofol (N/A, 01/26/2017); Esophageal dilation (N/A, 01/26/2017); polypectomy (N/A, 01/26/2017); PH impedance study (N/A, 05/30/2017); Esophageal manometry (N/A, 05/30/2017); Radiofrequency ablation nerves; afib (05/2019); LEFT HEART CATH AND CORONARY ANGIOGRAPHY (Left, 06/10/2019); Cardioversion (N/A, 12/03/2019); Esophagogastroduodenoscopy (egd) with propofol (N/A, 01/30/2020); Reduction mammaplasty (Bilateral, 05/2016); Breast biopsy (Right, 2008); Breast excisional biopsy (Left, 1998); TEE without cardioversion (N/A, 08/05/2020); ATRIAL FIBRILLATION ABLATION (N/A, 08/06/2020); Cardiac electrophysiology mapping and ablation; Esophagogastroduodenoscopy (egd) with propofol (N/A, 01/15/2021); Colonoscopy with propofol (N/A, 01/16/2021); Cardioversion (N/A, 08/01/2021); and Cardioversion (N/A, 08/05/2021). Family: family history includes Aneurysm in her maternal grandmother and mother; Anxiety disorder in her grandchild; Bipolar disorder in her grandchild and sister; Breast cancer (age of onset: 66) in her paternal grandmother; Depression in her grandchild.  Laboratory Chemistry Profile   Renal Lab Results  Component Value Date   BUN 12 08/08/2021   CREATININE 0.90 08/08/2021   BCR 9 (L) 01/19/2021   GFRAA >60 11/26/2019   GFRNONAA >60 08/08/2021    Hepatic Lab Results  Component Value Date   AST 21 08/08/2021   ALT 14 08/08/2021   ALBUMIN 4.5 08/08/2021   ALKPHOS 127 (H) 08/08/2021   LIPASE 26 08/08/2021    Electrolytes Lab Results  Component Value Date   NA 136 08/08/2021   K 4.1 08/08/2021   CL 102 08/08/2021   CALCIUM 9.7 08/08/2021   MG 2.3 08/08/2021    Bone Lab Results  Component Value Date   25OHVITD1 23 (L) 11/11/2018   25OHVITD2 9.8 11/11/2018   25OHVITD3 13 11/11/2018    Inflammation (CRP: Acute Phase) (ESR: Chronic Phase) Lab Results  Component Value Date   CRP 12 (H) 11/11/2018   ESRSEDRATE 42 (H) 11/11/2018   LATICACIDVEN 1.0 01/14/2021  Note: Above  Lab results reviewed.  Recent Imaging Review  ECHOCARDIOGRAM COMPLETE    ECHOCARDIOGRAM REPORT       Patient Name:   [name] Date of Exam: 07/31/2021 Medical Rec #:  [id number]        Height:       64.0 in Accession #:    [id number]       Weight:       154.0 lb Date of Birth:  Sep 23, 1960        BSA:          1.751 m Patient Age:    2 years         BP:           112/86 mmHg Patient Gender: F                HR:           74 bpm. Exam Location:  [facility]  Procedure: 2D Echo  Indications:     Atrial Fibrillation I48.91   History:         Patient has prior history of Echocardiogram examinations, most                  recent 08/05/2020.   Sonographer:     Kathlen Brunswick RDCS Referring Phys:  [id number] [name] Diagnosing Phys: Shelby Sable MD  IMPRESSIONS   1. Left ventricular ejection fraction, by estimation, is 55 to 60%. Left ventricular ejection fraction by 2D MOD biplane is 55.3 %. The left ventricle has normal function. The left ventricle has no regional wall motion abnormalities. There is mild left  ventricular hypertrophy. Left ventricular diastolic parameters are indeterminate.  2. Right ventricular systolic function is normal. The right ventricular size is normal.  3. The mitral valve is normal in structure. Trivial mitral valve regurgitation.  4. The aortic valve is tricuspid. Aortic valve regurgitation is trivial.  FINDINGS  Left Ventricle: Left ventricular ejection fraction, by estimation, is 55 to 60%. Left ventricular ejection fraction by 2D MOD biplane is 55.3 %. The left ventricle has normal function. The left ventricle has no regional wall motion abnormalities. The  left ventricular internal cavity size was normal in size. There is mild left ventricular hypertrophy. Left ventricular diastolic parameters are indeterminate.  Right Ventricle: The right ventricular size is normal. No increase in right ventricular wall thickness. Right ventricular  systolic function is normal.  Left Atrium: Left atrial size was normal in size.  Right Atrium: Right atrial size was normal in size.  Pericardium: There is no evidence of pericardial effusion.  Mitral Valve: The mitral valve is normal in structure. Trivial mitral valve regurgitation.  Tricuspid Valve: The tricuspid valve is normal in structure. Tricuspid valve regurgitation is mild.  Aortic Valve: The aortic valve is tricuspid. Aortic valve regurgitation is trivial. Aortic valve peak gradient measures 2.7 mmHg.  Pulmonic Valve: The pulmonic valve was not well visualized. Pulmonic valve regurgitation is not visualized.  Aorta: The aortic root and ascending aorta are structurally normal, with no evidence of dilitation.  Venous: The inferior vena cava was not well visualized.  IAS/Shunts: No atrial level shunt detected by color flow Doppler.    LEFT VENTRICLE PLAX 2D                        Biplane EF (MOD) LVIDd:         4.00 cm         LV  Biplane EF:   Left LVIDs:         2.60 cm                          ventricular LV PW:         1.10 cm                          ejection LV IVS:        1.20 cm                          fraction by LVOT diam:     1.90 cm                          2D MOD LV SV:         45                               biplane is LV SV Index:   26                               55.3 %. LVOT Area:     2.84 cm                                Diastology                                LV e' medial:    6.31 cm/s LV Volumes (MOD)               LV E/e' medial:  9.5 LV vol d, MOD    52.8 ml       LV e' lateral:   7.07 cm/s A2C:                           LV E/e' lateral: 8.5 LV vol d, MOD    36.2 ml A4C: LV vol s, MOD    24.0 ml A2C: LV vol s, MOD    14.0 ml A4C: LV SV MOD A2C:   28.8 ml LV SV MOD A4C:   36.2 ml LV SV MOD BP:    24.1 ml  RIGHT VENTRICLE RV Basal diam:  3.40 cm RV S prime:     9.25 cm/s TAPSE (M-mode): 1.6 cm  LEFT ATRIUM             Index         RIGHT ATRIUM           Index LA diam:        3.90 cm 2.23 cm/m   RA Area:     14.00 cm LA Vol (A2C):   48.1 ml 27.47 ml/m  RA Volume:   34.00 ml  19.42 ml/m LA Vol (A4C):   40.0 ml 22.85 ml/m LA Biplane Vol: 45.8 ml 26.16 ml/m  AORTIC VALVE                 PULMONIC VALVE AV Area (Vmax): 3.02 cm     PV Vmax:       0.71 m/s  AV Vmax:        81.40 cm/s   PV Peak grad:  2.0 mmHg AV Peak Grad:   2.7 mmHg LVOT Vmax:      86.80 cm/s LVOT Vmean:     57.500 cm/s LVOT VTI:       0.158 m   AORTA Ao Root diam: 3.20 cm Ao Asc diam:  3.20 cm  MITRAL VALVE               TRICUSPID VALVE MV Area (PHT): 4.19 cm    TV Peak grad:   10.6 mmHg MV Decel Time: 181 msec    TV Vmax:        1.62 m/s MV E velocity: 60.00 cm/s                            SHUNTS                            Systemic VTI:  0.16 m                            Systemic Diam: 1.90 cm  Shelby Sable MD Electronically signed by Shelby Sable MD Signature Date/Time: 07/31/2021/1:19:13 PM      Final   Note: Reviewed        Physical Exam  General appearance: Well nourished, well developed, and well hydrated. In no apparent acute distress Mental status: Alert, oriented x 3 (person, place, & time)       Respiratory: No evidence of acute respiratory distress Eyes: PERLA Vitals: There were no vitals taken for this visit. BMI: Estimated body mass index is 25.4 kg/m as calculated from the following:   Height as of 08/08/21: 5' 4"  (1.626 m).   Weight as of 08/08/21: 148 lb (67.1 kg). Ideal: Patient weight not recorded  Assessment   Diagnosis Status  1. Chronic pain syndrome   2. Chronic low back pain (1ry area of Pain) (Bilateral) (L>R) w/o sciatica   3. Chronic hip pain (2ry area of Pain) (Bilateral) (L>R)   4. Chronic shoulder pain (3ry area of Pain) (Bilateral) (L>R)   5. Cervicalgia   6. Chronic neck pain with history of cervical spinal surgery   7. Cervicogenic headache   8. Chronic musculoskeletal pain   9.  Chronic upper back pain   10. Failed back syndrome of cervical spine   11. 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 Lumbar facet syndrome (Bilateral) (L>R)   13. Pharmacologic therapy   14. Chronic use of opiate for therapeutic purpose   15. Encounter for medication management   16. Encounter for chronic pain management    Controlled Controlled Controlled   Updated Problems: Problem  Cervicalgia  Enthesopathy of hip region (Left)  Longstanding Persistent Atrial Fibrillation ([facility])  Nausea & Vomiting  Hypertension  Palpitations    Plan of Care  Problem-specific:  No problem-specific Assessment & Plan notes found for this encounter.  [name] has a current medication list which includes the following long-term medication(s): amiodarone, amiodarone, apixaban, diltiazem, diltiazem, diltiazem, linzess, montelukast, morphine, and omeprazole.  Pharmacotherapy (Medications Ordered): No orders of the defined types were placed in this encounter.  Orders:  No orders of the defined types were placed in this encounter.  Follow-up plan:   No follow-ups on file.  Interventional Therapies  Risk  Complexity Considerations:   Estimated body mass index is 25.4 kg/m as calculated from the following:   Height as of this encounter: 5' 4"  (1.626 m).   Weight as of this encounter: 148 lb (67.1 kg). Risk  Complexity Considerations:   1. ELIQUIS ANTICOAGULATION: (Stop: 3 days  Restart: 6 hours) 2. CONTRAST & SHELLFISH ALLERGY.  3. Dexamethasone & Prednisone ALLERGY.  (NO STEROIDS)  4. NOT a candidate for lumbar facet RFA (due to hardware). 5. NOT a candidate for membrane stabilizers (Neurontin/Lyrica) due to prior failed trials. 6. NOT a candidate for a lumbar SCS (due to HARDWARE). 7. NOT a candidate for IT Pump (patient's choice due to surgical risks). 8. NOT a candidate for a lcervical SCS (due to posterior fusion).       Planned  Pending:       Under consideration:   Diagnostic right suprascapular NB #3 (without steroids)    Completed (No Steroids):   Diagnostic bilateral lumbar facet MBB x2 (No Steroids)) (01/14/2019) (100/100/100/0)  Palliative bilateral suprascapular NB x2 (No Steroids) (06/19/2016) (80/80/0)  Palliative left suprascapular nerve RFA x1 (01/04/2017) (100/100/0. Reinjured shoulder on 02/01/2017)    Therapeutic  Palliative (PRN) options:   Diagnostic bilateral lumbar facet MBB #2  Palliative bilateral suprascapular NB #3 (No Steroids) Palliative left suprascapular nerve RFA #2       Recent Visits Date Type Provider Dept  06/13/21 Office Visit Milinda Pointer, MD [facility]  Showing recent visits within past 90 days and meeting all other requirements Future Appointments Date Type Provider Dept  09/12/21 Appointment Milinda Pointer, MD [facility]  Showing future appointments within next 90 days and meeting all other requirements  I discussed the assessment and treatment plan with the patient. The patient was provided an opportunity to ask questions and all were answered. The patient agreed with the plan and demonstrated an understanding of the instructions.  Patient advised to call back or seek an in-person evaluation if the symptoms or condition worsens.  Duration of encounter: *** minutes.  Total time on encounter, as per AMA guidelines included both the face-to-face and non-face-to-face time personally spent by the physician and/or other qualified health care professional(s) on the day of the encounter (includes time in activities that require the physician or other qualified health care professional and does not include time in activities normally performed by clinical staff). Physician's time may include the following activities when performed: preparing to see the patient (eg, review of tests, pre-charting review of records) obtaining and/or reviewing separately obtained  history performing a medically appropriate examination and/or evaluation counseling and educating the patient/family/caregiver ordering medications, tests, or procedures referring and communicating with other health care professionals (when not separately reported) documenting clinical information in the electronic or other health record independently interpreting results (not separately reported) and communicating results to the patient/ family/caregiver care coordination (not separately reported)  Note by: Shelby Cola, MD Date: 09/12/2021; Time: 12:25 PM

## 2021-09-12 ENCOUNTER — Ambulatory Visit: Payer: Medicare Other | Attending: Pain Medicine | Admitting: Pain Medicine

## 2021-09-12 ENCOUNTER — Other Ambulatory Visit: Payer: Self-pay

## 2021-09-12 ENCOUNTER — Encounter: Payer: Self-pay | Admitting: Pain Medicine

## 2021-09-12 VITALS — BP 109/76 | HR 73 | Temp 98.1°F | Resp 16 | Ht 64.0 in | Wt 149.1 lb

## 2021-09-12 DIAGNOSIS — M47816 Spondylosis without myelopathy or radiculopathy, lumbar region: Secondary | ICD-10-CM | POA: Insufficient documentation

## 2021-09-12 DIAGNOSIS — M25552 Pain in left hip: Secondary | ICD-10-CM | POA: Insufficient documentation

## 2021-09-12 DIAGNOSIS — M542 Cervicalgia: Secondary | ICD-10-CM | POA: Diagnosis not present

## 2021-09-12 DIAGNOSIS — G894 Chronic pain syndrome: Secondary | ICD-10-CM | POA: Diagnosis not present

## 2021-09-12 DIAGNOSIS — M545 Low back pain, unspecified: Secondary | ICD-10-CM | POA: Insufficient documentation

## 2021-09-12 DIAGNOSIS — Z9889 Other specified postprocedural states: Secondary | ICD-10-CM | POA: Insufficient documentation

## 2021-09-12 DIAGNOSIS — G8928 Other chronic postprocedural pain: Secondary | ICD-10-CM | POA: Diagnosis not present

## 2021-09-12 DIAGNOSIS — M25551 Pain in right hip: Secondary | ICD-10-CM | POA: Insufficient documentation

## 2021-09-12 DIAGNOSIS — M961 Postlaminectomy syndrome, not elsewhere classified: Secondary | ICD-10-CM | POA: Insufficient documentation

## 2021-09-12 DIAGNOSIS — G4486 Cervicogenic headache: Secondary | ICD-10-CM | POA: Diagnosis not present

## 2021-09-12 DIAGNOSIS — G8929 Other chronic pain: Secondary | ICD-10-CM | POA: Insufficient documentation

## 2021-09-12 DIAGNOSIS — Z79891 Long term (current) use of opiate analgesic: Secondary | ICD-10-CM | POA: Diagnosis not present

## 2021-09-12 DIAGNOSIS — M25511 Pain in right shoulder: Secondary | ICD-10-CM | POA: Diagnosis not present

## 2021-09-12 DIAGNOSIS — M25512 Pain in left shoulder: Secondary | ICD-10-CM | POA: Insufficient documentation

## 2021-09-12 DIAGNOSIS — Z79899 Other long term (current) drug therapy: Secondary | ICD-10-CM | POA: Insufficient documentation

## 2021-09-12 DIAGNOSIS — M7918 Myalgia, other site: Secondary | ICD-10-CM | POA: Diagnosis not present

## 2021-09-12 DIAGNOSIS — M549 Dorsalgia, unspecified: Secondary | ICD-10-CM | POA: Diagnosis not present

## 2021-09-12 MED ORDER — MORPHINE SULFATE 15 MG PO TABS: 15.0000 mg | ORAL_TABLET | Freq: Four times a day (QID) | ORAL | 0 refills | Status: DC | PRN

## 2021-09-12 NOTE — Patient Instructions (Signed)
____________________________________________________________________________________________  Medication Rules  Purpose: To inform patients, and their family members, of our rules and regulations.  Applies to: All patients receiving prescriptions (written or electronic).  Pharmacy of record: Pharmacy where electronic prescriptions will be sent. If written prescriptions are taken to a different pharmacy, please inform the nursing staff. The pharmacy listed in the electronic medical record should be the one where you would like electronic prescriptions to be sent.  Electronic prescriptions: In compliance with the [place] Strengthen Opioid Misuse Prevention (STOP) Act of 2017 (Session Law 2017-74/H243), effective March 13, 2018, all controlled substances must be electronically prescribed. Calling prescriptions to the pharmacy will cease to exist.  Prescription refills: Only during scheduled appointments. Applies to all prescriptions.  NOTE: The following applies primarily to controlled substances (Opioid* Pain Medications).   Type of encounter (visit): For patients receiving controlled substances, face-to-face visits are required. (Not an option or up to the patient.)  Patient's responsibilities: Pain Pills: Bring all pain pills to every appointment (except for procedure appointments). Pill Bottles: Bring pills in original pharmacy bottle. Always bring the newest bottle. Bring bottle, even if empty. Medication refills: You are responsible for knowing and keeping track of what medications you take and those you need refilled. The day before your appointment: write a list of all prescriptions that need to be refilled. The day of the appointment: give the list to the admitting nurse. Prescriptions will be written only during appointments. No prescriptions will be written on procedure days. If you forget a medication: it will not be "Called in", "Faxed", or "electronically sent". You will  need to get another appointment to get these prescribed. No early refills. Do not call asking to have your prescription filled early. Prescription Accuracy: You are responsible for carefully inspecting your prescriptions before leaving our office. Have the discharge nurse carefully go over each prescription with you, before taking them home. Make sure that your name is accurately spelled, that your address is correct. Check the name and dose of your medication to make sure it is accurate. Check the number of pills, and the written instructions to make sure they are clear and accurate. Make sure that you are given enough medication to last until your next medication refill appointment. Taking Medication: Take medication as prescribed. When it comes to controlled substances, taking less pills or less frequently than prescribed is permitted and encouraged. Never take more pills than instructed. Never take medication more frequently than prescribed.  Inform other Doctors: Always inform, all of your healthcare providers, of all the medications you take. Pain Medication from other Providers: You are not allowed to accept any additional pain medication from any other Doctor or Healthcare provider. There are two exceptions to this rule. (see below) In the event that you require additional pain medication, you are responsible for notifying us, as stated below. Cough Medicine: Often these contain an opioid, such as codeine or hydrocodone. Never accept or take cough medicine containing these opioids if you are already taking an opioid* medication. The combination may cause respiratory failure and death. Medication Agreement: You are responsible for carefully reading and following our Medication Agreement. This must be signed before receiving any prescriptions from our practice. Safely store a copy of your signed Agreement. Violations to the Agreement will result in no further prescriptions. (Additional copies of our  Medication Agreement are available upon request.) Laws, Rules, & Regulations: All patients are expected to follow all Federal and State Laws, Statutes, Rules, & Regulations. Ignorance of   the Laws does not constitute a valid excuse.  Illegal drugs and Controlled Substances: The use of illegal substances (including, but not limited to marijuana and its derivatives) and/or the illegal use of any controlled substances is strictly prohibited. Violation of this rule may result in the immediate and permanent discontinuation of any and all prescriptions being written by our practice. The use of any illegal substances is prohibited. Adopted CDC guidelines & recommendations: Target dosing levels will be at or below 60 MME/day. Use of benzodiazepines** is not recommended.  Exceptions: There are only two exceptions to the rule of not receiving pain medications from other Healthcare Providers. Exception #1 (Emergencies): In the event of an emergency (i.e.: accident requiring emergency care), you are allowed to receive additional pain medication. However, you are responsible for: As soon as you are able, call our office [phone], at any time of the day or night, and leave a message stating your name, the date and nature of the emergency, and the name and dose of the medication prescribed. In the event that your call is answered by a member of our staff, make sure to document and save the date, time, and the name of the person that took your information.  Exception #2 (Planned Surgery): In the event that you are scheduled by another doctor or dentist to have any type of surgery or procedure, you are allowed (for a period no longer than 30 days), to receive additional pain medication, for the acute post-op pain. However, in this case, you are responsible for picking up a copy of our "Post-op Pain Management for Surgeons" handout, and giving it to your surgeon or dentist. This document is available at our office, and  does not require an appointment to obtain it. Simply go to our office during business hours (Monday-Thursday from 8:00 AM to 4:00 PM) (Friday 8:00 AM to 12:00 Noon) or if you have a scheduled appointment with us, prior to your surgery, and ask for it by name. In addition, you are responsible for: calling our office [phone], at any time of the day or night, and leaving a message stating your name, name of your surgeon, type of surgery, and date of procedure or surgery. Failure to comply with your responsibilities may result in termination of therapy involving the controlled substances. Medication Agreement Violation. Following the above rules, including your responsibilities will help you in avoiding a Medication Agreement Violation ("Breaking your Pain Medication Contract").  *Opioid medications include: morphine, codeine, oxycodone, oxymorphone, hydrocodone, hydromorphone, meperidine, tramadol, tapentadol, buprenorphine, fentanyl, methadone. **Benzodiazepine medications include: diazepam (Valium), alprazolam (Xanax), clonazepam (Klonopine), lorazepam (Ativan), clorazepate (Tranxene), chlordiazepoxide (Librium), estazolam (Prosom), oxazepam (Serax), temazepam (Restoril), triazolam (Halcion) (Last updated: 12/08/2020) ____________________________________________________________________________________________  ____________________________________________________________________________________________  Medication Recommendations and Reminders  Applies to: All patients receiving prescriptions (written and/or electronic).  Medication Rules & Regulations: These rules and regulations exist for your safety and that of others. They are not flexible and neither are we. Dismissing or ignoring them will be considered "non-compliance" with medication therapy, resulting in complete and irreversible termination of such therapy. (See document titled "Medication Rules" for more details.) In all conscience,  because of safety reasons, we cannot continue providing a therapy where the patient does not follow instructions.  Pharmacy of record:  Definition: This is the pharmacy where your electronic prescriptions will be sent.  We do not endorse any particular pharmacy, however, we have experienced problems with [facility] not securing enough medication supply for the community. We do not restrict you   in your choice of pharmacy. However, once we write for your prescriptions, we will NOT be re-sending more prescriptions to fix restricted supply problems created by your pharmacy, or your insurance.  The pharmacy listed in the electronic medical record should be the one where you want electronic prescriptions to be sent. If you choose to change pharmacy, simply notify our nursing staff.  Recommendations: Keep all of your pain medications in a safe place, under lock and key, even if you live alone. We will NOT replace lost, stolen, or damaged medication. After you fill your prescription, take 1 week's worth of pills and put them away in a safe place. You should keep a separate, properly labeled bottle for this purpose. The remainder should be kept in the original bottle. Use this as your primary supply, until it runs out. Once it's gone, then you know that you have 1 week's worth of medicine, and it is time to come in for a prescription refill. If you do this correctly, it is unlikely that you will ever run out of medicine. To make sure that the above recommendation works, it is very important that you make sure your medication refill appointments are scheduled at least 1 week before you run out of medicine. To do this in an effective manner, make sure that you do not leave the office without scheduling your next medication management appointment. Always ask the nursing staff to show you in your prescription , when your medication will be running out. Then arrange for the receptionist to get you a return appointment,  at least 7 days before you run out of medicine. Do not wait until you have 1 or 2 pills left, to come in. This is very poor planning and does not take into consideration that we may need to cancel appointments due to bad weather, sickness, or emergencies affecting our staff. DO NOT ACCEPT A "Partial Fill": If for any reason your pharmacy does not have enough pills/tablets to completely fill or refill your prescription, do not allow for a "partial fill". The law allows the pharmacy to complete that prescription within 72 hours, without requiring a new prescription. If they do not fill the rest of your prescription within those 72 hours, you will need a separate prescription to fill the remaining amount, which we will NOT provide. If the reason for the partial fill is your insurance, you will need to talk to the pharmacist about payment alternatives for the remaining tablets, but again, DO NOT ACCEPT A PARTIAL FILL, unless you can trust your pharmacist to obtain the remainder of the pills within 72 hours.  Prescription refills and/or changes in medication(s):  Prescription refills, and/or changes in dose or medication, will be conducted only during scheduled medication management appointments. (Applies to both, written and electronic prescriptions.) No refills on procedure days. No medication will be changed or started on procedure days. No changes, adjustments, and/or refills will be conducted on a procedure day. Doing so will interfere with the diagnostic portion of the procedure. No phone refills. No medications will be "called into the pharmacy". No Fax refills. No weekend refills. No Holliday refills. No after hours refills.  Remember:  Business hours are:  Monday to Thursday 8:00 AM to 4:00 PM Provider's Schedule: Lakin Rhine, MD - Appointments are:  Medication management: Monday and Wednesday 8:00 AM to 4:00 PM Procedure day: Tuesday and Thursday 7:30 AM to 4:00 PM Bilal Lateef, MD -  Appointments are:  Medication management: Tuesday and Thursday 8:00   AM to 4:00 PM Procedure day: Monday and Wednesday 7:30 AM to 4:00 PM (Last update: 10/01/2019) ____________________________________________________________________________________________  ____________________________________________________________________________________________  CBD (cannabidiol) & Delta-8 (Delta-8 tetrahydrocannabinol) WARNING  Intro: Cannabidiol (CBD) and tetrahydrocannabinol (THC), are two natural compounds found in plants of the Cannabis genus. They can both be extracted from hemp or cannabis. Hemp and cannabis come from the Cannabis sativa plant. Both compounds interact with your body's endocannabinoid system, but they have very different effects. CBD does not produce the high sensation associated with cannabis. Delta-8 tetrahydrocannabinol, also known as delta-8 THC, is a psychoactive substance found in the Cannabis sativa plant, of which marijuana and hemp are two varieties. THC is responsible for the high associated with the illicit use of marijuana.  Applicable to: All individuals currently taking or considering taking CBD (cannabidiol) and, more important, all patients taking opioid analgesic controlled substances (pain medication). (Example: oxycodone; oxymorphone; hydrocodone; hydromorphone; morphine; methadone; tramadol; tapentadol; fentanyl; buprenorphine; butorphanol; dextromethorphan; meperidine; codeine; etc.)  Legal status: CBD remains a Schedule I drug prohibited for any use. CBD is illegal with one exception. In the [place], CBD has a limited Food and Drug Administration (FDA) approval for the treatment of two specific types of epilepsy disorders. Only one CBD product has been approved by the FDA for this purpose: "Epidiolex". FDA is aware that some companies are marketing products containing cannabis and cannabis-derived compounds in ways that violate the Federal Food, Drug and Cosmetic Act  (FD&C Act) and that may put the health and safety of consumers at risk. The FDA, a Federal agency, has not enforced the CBD status since 2018. UPDATE: (04/29/2021) The Drug Enforcement Agency (DEA) issued a letter stating that "delta" cannabinoids, including Delta-8-THCO and Delta-9-THCO, synthetically derived from hemp do not qualify as hemp and will be viewed as Schedule I drugs. (Schedule I drugs, substances, or chemicals are defined as drugs with no currently accepted medical use and a high potential for abuse. Some examples of Schedule I drugs are: heroin, lysergic acid diethylamide (LSD), marijuana (cannabis), 3,4-methylenedioxymethamphetamine (ecstasy), methaqualone, and peyote.) (https://www.dea.gov)  Legality: Some manufacturers ship CBD products nationally, which is illegal. Often such products are sold online and are therefore available throughout the country. CBD is openly sold in head shops and health food stores in some states where such sales have not been explicitly legalized. Selling unapproved products with unsubstantiated therapeutic claims is not only a violation of the law, but also can put patients at risk, as these products have not been proven to be safe or effective. Federal illegality makes it difficult to conduct research on CBD.  Reference: "FDA Regulation of Cannabis and Cannabis-Derived Products, Including Cannabidiol (CBD)" - https://www.fda.gov/news-events/public-health-focus/fda-regulation-cannabis-and-cannabis-derived-products-including-cannabidiol-cbd  Warning: CBD is not FDA approved and has not undergo the same manufacturing controls as prescription drugs.  This means that the purity and safety of available CBD may be questionable. Most of the time, despite manufacturer's claims, it is contaminated with THC (delta-9-tetrahydrocannabinol - the chemical in marijuana responsible for the "HIGH").  When this is the case, the THC contaminant will trigger a positive urine drug  screen (UDS) test for Marijuana (carboxy-THC). Because a positive UDS for any illicit substance is a violation of our medication agreement, your opioid analgesics (pain medicine) may be permanently discontinued. The FDA recently put out a warning about 5 things that everyone should be aware of regarding Delta-8 THC: Delta-8 THC products have not been evaluated or approved by the FDA for safe use and may be marketed in ways that put the   public health at risk. The FDA has received adverse event reports involving delta-8 THC-containing products. Delta-8 THC has psychoactive and intoxicating effects. Delta-8 THC manufacturing often involve use of potentially harmful chemicals to create the concentrations of delta-8 THC claimed in the marketplace. The final delta-8 THC product may have potentially harmful by-products (contaminants) due to the chemicals used in the process. Manufacturing of delta-8 THC products may occur in uncontrolled or unsanitary settings, which may lead to the presence of unsafe contaminants or other potentially harmful substances. Delta-8 THC products should be kept out of the reach of children and pets.  MORE ABOUT CBD  General Information: CBD was discovered in 1940 and it is a derivative of the cannabis sativa genus plants (Marijuana and Hemp). It is one of the 113 identified substances found in Marijuana. It accounts for up to 40% of the plant's extract. As of 2018, preliminary clinical studies on CBD included research for the treatment of anxiety, movement disorders, and pain. CBD is available and consumed in multiple forms, including inhalation of smoke or vapor, as an aerosol spray, and by mouth. It may be supplied as an oil containing CBD, capsules, dried cannabis, or as a liquid solution. CBD is thought not to be as psychoactive as THC (delta-9-tetrahydrocannabinol - the chemical in marijuana responsible for the "HIGH"). Studies suggest that CBD may interact with different  biological target receptors in the body, including cannabinoid and other neurotransmitter receptors. As of 2018 the mechanism of action for its biological effects has not been determined.  Side-effects  Adverse reactions: Dry mouth, diarrhea, decreased appetite, fatigue, drowsiness, malaise, weakness, sleep disturbances, and others.  Drug interactions: CBC may interact with other medications such as blood-thinners. Because CBD causes drowsiness on its own, it also increases the drowsiness caused by other medications, including antihistamines (such as Benadryl), benzodiazepines (Xanax, Ativan, Valium), antipsychotics, antidepressants and opioids, as well as alcohol and supplements such as kava, melatonin and St. John's Wort. Be cautious with the following combinations:   Brivaracetam (Briviact) Brivaracetam is changed and broken down by the body. CBD might decrease how quickly the body breaks down brivaracetam. This might increase levels of brivaracetam in the body.  Caffeine Caffeine is changed and broken down by the body. CBD might decrease how quickly the body breaks down caffeine. This might increase levels of caffeine in the body.  Carbamazepine (Tegretol) Carbamazepine is changed and broken down by the body. CBD might decrease how quickly the body breaks down carbamazepine. This might increase levels of carbamazepine in the body and increase its side effects.  Citalopram (Celexa) Citalopram is changed and broken down by the body. CBD might decrease how quickly the body breaks down citalopram. This might increase levels of citalopram in the body and increase its side effects.  Clobazam (Onfi) Clobazam is changed and broken down by the liver. CBD might decrease how quickly the liver breaks down clobazam. This might increase the effects and side effects of clobazam.  Eslicarbazepine (Aptiom) Eslicarbazepine is changed and broken down by the body. CBD might decrease how quickly the body  breaks down eslicarbazepine. This might increase levels of eslicarbazepine in the body by a small amount.  Everolimus (Zostress) Everolimus is changed and broken down by the body. CBD might decrease how quickly the body breaks down everolimus. This might increase levels of everolimus in the body.  Lithium Taking higher doses of CBD might increase levels of lithium. This can increase the risk of lithium toxicity.  Medications changed by the liver (  Cytochrome P450 1A1 (CYP1A1) substrates) Some medications are changed and broken down by the liver. CBD might change how quickly the liver breaks down these medications. This could change the effects and side effects of these medications.  Medications changed by the liver (Cytochrome P450 1A2 (CYP1A2) substrates) Some medications are changed and broken down by the liver. CBD might change how quickly the liver breaks down these medications. This could change the effects and side effects of these medications.  Medications changed by the liver (Cytochrome P450 1B1 (CYP1B1) substrates) Some medications are changed and broken down by the liver. CBD might change how quickly the liver breaks down these medications. This could change the effects and side effects of these medications.  Medications changed by the liver (Cytochrome P450 2A6 (CYP2A6) substrates) Some medications are changed and broken down by the liver. CBD might change how quickly the liver breaks down these medications. This could change the effects and side effects of these medications.  Medications changed by the liver (Cytochrome P450 2B6 (CYP2B6) substrates) Some medications are changed and broken down by the liver. CBD might change how quickly the liver breaks down these medications. This could change the effects and side effects of these medications.  Medications changed by the liver (Cytochrome P450 2C19 (CYP2C19) substrates) Some medications are changed and broken down by the liver.  CBD might change how quickly the liver breaks down these medications. This could change the effects and side effects of these medications.  Medications changed by the liver (Cytochrome P450 2C8 (CYP2C8) substrates) Some medications are changed and broken down by the liver. CBD might change how quickly the liver breaks down these medications. This could change the effects and side effects of these medications.  Medications changed by the liver (Cytochrome P450 2C9 (CYP2C9) substrates) Some medications are changed and broken down by the liver. CBD might change how quickly the liver breaks down these medications. This could change the effects and side effects of these medications.  Medications changed by the liver (Cytochrome P450 2D6 (CYP2D6) substrates) Some medications are changed and broken down by the liver. CBD might change how quickly the liver breaks down these medications. This could change the effects and side effects of these medications.  Medications changed by the liver (Cytochrome P450 2E1 (CYP2E1) substrates) Some medications are changed and broken down by the liver. CBD might change how quickly the liver breaks down these medications. This could change the effects and side effects of these medications.  Medications changed by the liver (Cytochrome P450 3A4 (CYP3A4) substrates) Some medications are changed and broken down by the liver. CBD might change how quickly the liver breaks down these medications. This could change the effects and side effects of these medications.  Medications changed by the liver (Glucuronidated drugs) Some medications are changed and broken down by the liver. CBD might change how quickly the liver breaks down these medications. This could change the effects and side effects of these medications.  Medications that decrease the breakdown of other medications by the liver (Cytochrome P450 2C19 (CYP2C19) inhibitors) CBD is changed and broken down by the liver.  Some drugs decrease how quickly the liver changes and breaks down CBD. This could change the effects and side effects of CBD.  Medications that decrease the breakdown of other medications in the liver (Cytochrome P450 3A4 (CYP3A4) inhibitors) CBD is changed and broken down by the liver. Some drugs decrease how quickly the liver changes and breaks down CBD. This could change the effects   and side effects of CBD.  Medications that increase breakdown of other medications by the liver (Cytochrome P450 3A4 (CYP3A4) inducers) CBD is changed and broken down by the liver. Some drugs increase how quickly the liver changes and breaks down CBD. This could change the effects and side effects of CBD.  Medications that increase the breakdown of other medications by the liver (Cytochrome P450 2C19 (CYP2C19) inducers) CBD is changed and broken down by the liver. Some drugs increase how quickly the liver changes and breaks down CBD. This could change the effects and side effects of CBD.  Methadone (Dolophine) Methadone is broken down by the liver. CBD might decrease how quickly the liver breaks down methadone. Taking cannabidiol along with methadone might increase the effects and side effects of methadone.  Rufinamide (Banzel) Rufinamide is changed and broken down by the body. CBD might decrease how quickly the body breaks down rufinamide. This might increase levels of rufinamide in the body by a small amount.  Sedative medications (CNS depressants) CBD might cause sleepiness and slowed breathing. Some medications, called sedatives, can also cause sleepiness and slowed breathing. Taking CBD with sedative medications might cause breathing problems and/or too much sleepiness.  Sirolimus (Rapamune) Sirolimus is changed and broken down by the body. CBD might decrease how quickly the body breaks down sirolimus. This might increase levels of sirolimus in the body.  Stiripentol (Diacomit) Stiripentol is changed and  broken down by the body. CBD might decrease how quickly the body breaks down stiripentol. This might increase levels of stiripentol in the body and increase its side effects.  Tacrolimus (Prograf) Tacrolimus is changed and broken down by the body. CBD might decrease how quickly the body breaks down tacrolimus. This might increase levels of tacrolimus in the body.  Tamoxifen (Soltamox) Tamoxifen is changed and broken down by the body. CBD might affect how quickly the body breaks down tamoxifen. This might affect levels of tamoxifen in the body.  Topiramate (Topamax) Topiramate is changed and broken down by the body. CBD might decrease how quickly the body breaks down topiramate. This might increase levels of topiramate in the body by a small amount.  Valproate Valproic acid can cause liver injury. Taking cannabidiol with valproic acid might increase the chance of liver injury. CBD and/or valproic acid might need to be stopped, or the dose might need to be reduced.  Warfarin (Coumadin) CBD might increase levels of warfarin, which can increase the risk for bleeding. CBD and/or warfarin might need to be stopped, or the dose might need to be reduced.  Zonisamide Zonisamide is changed and broken down by the body. CBD might decrease how quickly the body breaks down zonisamide. This might increase levels of zonisamide in the body by a small amount. (Last update: 05/11/2021) ____________________________________________________________________________________________  ____________________________________________________________________________________________  Drug Holidays (Slow)  What is a "Drug Holiday"? Drug Holiday: is the name given to the period of time during which a patient stops taking a medication(s) for the purpose of eliminating tolerance to the drug.  Benefits Improved effectiveness of opioids. Decreased opioid dose needed to achieve benefits. Improved pain with lesser  dose.  What is tolerance? Tolerance: is the progressive decreased in effectiveness of a drug due to its repetitive use. With repetitive use, the body gets use to the medication and as a consequence, it loses its effectiveness. This is a common problem seen with opioid pain medications. As a result, a larger dose of the drug is needed to achieve the same effect that   used to be obtained with a smaller dose.  How long should a "Drug Holiday" last? You should stay off of the pain medicine for at least 14 consecutive days. (2 weeks)  Should I stop the medicine "cold turkey"? No. You should always coordinate with your Pain Specialist so that he/she can provide you with the correct medication dose to make the transition as smoothly as possible.  How do I stop the medicine? Slowly. You will be instructed to decrease the daily amount of pills that you take by one (1) pill every seven (7) days. This is called a "slow downward taper" of your dose. For example: if you normally take four (4) pills per day, you will be asked to drop this dose to three (3) pills per day for seven (7) days, then to two (2) pills per day for seven (7) days, then to one (1) per day for seven (7) days, and at the end of those last seven (7) days, this is when the "Drug Holiday" would start.   Will I have withdrawals? By doing a "slow downward taper" like this one, it is unlikely that you will experience any significant withdrawal symptoms. Typically, what triggers withdrawals is the sudden stop of a high dose opioid therapy. Withdrawals can usually be avoided by slowly decreasing the dose over a prolonged period of time. If you do not follow these instructions and decide to stop your medication abruptly, withdrawals may be possible.  What are withdrawals? Withdrawals: refers to the wide range of symptoms that occur after stopping or dramatically reducing opiate drugs after heavy and prolonged use. Withdrawal symptoms do not occur to  patients that use low dose opioids, or those who take the medication sporadically. Contrary to benzodiazepine (example: Valium, Xanax, etc.) or alcohol withdrawals ("Delirium Tremens"), opioid withdrawals are not lethal. Withdrawals are the physical manifestation of the body getting rid of the excess receptors.  Expected Symptoms Early symptoms of withdrawal may include: Agitation Anxiety Muscle aches Increased tearing Insomnia Runny nose Sweating Yawning  Late symptoms of withdrawal may include: Abdominal cramping Diarrhea Dilated pupils Goose bumps Nausea Vomiting  Will I experience withdrawals? Due to the slow nature of the taper, it is very unlikely that you will experience any.  What is a slow taper? Taper: refers to the gradual decrease in dose.  (Last update: 10/01/2019) ____________________________________________________________________________________________   ____________________________________________________________________________________________  Pharmacy Shortages of Pain Medication   Introduction Shockingly as it may seem, .  "No [place] Supreme Court decision has ever interpreted the Constitution as guaranteeing a right to health care for all Americans." - https://www.healthequityandpolicylab.com/elusive-right-to-health-care-under-us-law  "With respect to human rights, the [place] has no formally codified right to health, nor does it participate in a human rights treaty that specifies a right to health." - Scott J. Schweikart, JD, MBE  Situation By now, most of our patients have had the experience of being told by their pharmacist that they do not have enough medication to cover their prescription. If you have not had this experience, just know that you soon will.  Problem There appears to be a shortage of these medications, either at the national level or locally. This is happening with all pharmacies. When there is not enough medication, patients  are offered a partial fill and they are told that they will try to get the rest of the medicine for them at a later time. If they do not have enough for even a partial fill, the pharmacists are telling the patients to   call us (the prescribing physicians) to request that we send another prescription to another pharmacy to get the medicine.   This reordering of a controlled substance creates documentation problems where additional paperwork needs to be created to explain why two prescriptions for the same period of time and the same medicine are being prescribed to the same patient. It also creates situations where the last appointment note does not accurately reflect when and what prescriptions were given to a patient. This leads to prescribing errors down the line, in subsequent follow-up visits.   [place] Board [employer]acy ([place]) Research revealed that Board of Pharmacy Rule .1806 (21 NCAC 46.1806) authorizes pharmacists to the transfer of prescriptions among pharmacies, and it sets forth procedural and recordkeeping requirements for doing so. However, this requires the pharmacist to complete the previously mentioned procedural paperwork to accomplish the transfer. As it turns out, it is much easier for them to have the prescribing physicians do the work.   Possible solutions 1. Have the [place] Assembly add a provision to the "STOP ACT" (the law that mandates how controlled substances are prescribed) where there is an exception to the electronic prescribing rule that states that in the event there are shortages of medications the physicians are allowed to use written prescriptions as opposed to electronic ones. This would allow patients to take their prescriptions to a different pharmacy that may have enough medication available to fill the prescription. The problem is that currently there is a law that does not allow for written prescriptions, with the exception of instances where the  electronic medical record is down due to technical issues.  2. Have [place] Congress ease the pressure on pharmaceutical companies, allowing them to produce enough quantities of the medication to adequately supply the population. 3. Have pharmacies keep enough stocks of these medications to cover their client base.  4. Have the [place] Assembly add a provision to the "STOP ACT" where they ease the regulations surrounding the transfer of controlled substances between pharmacies, so as to simplify the transfer of supplies. As an alternative, develop a system to allow patients to obtain the remainder of their prescription at another one of their pharmacies or at an associate pharmacy.   How this shortage will affect you.  The one thing that is abundantly clear is that this is a pharmacy supply problem  and not a prescriber problem. The job of the prescriber is to evaluate and monitor the patients for the appropriate indications to the use of these medicines, the monitoring of their use and the prescribing of the appropriate dose and regimen. It is not the job of the prescriber to provide or dispense the actual medication. By law, this is the job of the pharmacies and pharmacists. It is certainly not the job of the prescriber to solve the supply problems.   Due to the above problems we are no longer taking patients to write for their pain medication. We will continue to evaluate for appropriate indications and we may provide recommendations regarding medication, dose, and schedule, as well as monitoring recommendations, however, we will not be taking over the actual prescribing of these substances. On those patients where we are treating their chronic pain with interventional therapies, exceptions will be considered on a case by case basis. At this time, we will try to continue providing this supplemental service to those patients we have been managing in the past. However, as of August 1st, 2023, we no  longer   will be sending additional prescriptions to other pharmacies for the purpose of solving their supply problems. Once we send a prescription to a pharmacy, we will not be resending it again to another pharmacy to cover for their shortages.   What to do. Write as many letters as you can. Recruit the help of family members in writing these letters. Below are some of the places where you can write to make your voice heard. Let them know what the problem is and push them to look for solutions.   Search internet for: "[place] find your legislators" https://www.ncleg.gov/findyourlegislators  Search internet for: "[place] insurance commissioner complaints" https://www.ncdoi.gov/contactscomplaints/assistance-or-file-complaint  Search internet for: "[place] Board of Pharmacy complaints" http://www.ncbop.org/contact.htm  Search internet for: "[facility] pharmacy complaints" Email [facility] Customer Relations https://www.[facility].com/help/email-[email or web address].jsp?callType=store  Search internet for: "[facility] pharmacy customer service complaints" https://www.[facility].com/topic/marketing/contactus/contactus_customerservice.jsp  ____________________________________________________________________________________________   

## 2021-09-12 NOTE — Progress Notes (Signed)
Nursing Pain Medication Assessment:  Safety precautions to be maintained throughout the outpatient stay will include: orient to surroundings, keep bed in low position, maintain call bell within reach at all times, provide assistance with transfer out of bed and ambulation.  Medication Inspection Compliance: Pill count conducted under aseptic conditions, in front of the patient. Neither the pills nor the bottle was removed from the patient's sight at any time. Once count was completed pills were immediately returned to the patient in their original bottle.  Medication: Morphine IR Pill/Patch Count:  44 of 120 pills remain Pill/Patch Appearance: Markings consistent with prescribed medication Bottle Appearance: Standard pharmacy container. Clearly labeled. Filled Date: 06 / 12 / 2023 Last Medication intake:  Today

## 2021-09-14 ENCOUNTER — Ambulatory Visit: Payer: Medicare Other | Admitting: Cardiology

## 2021-09-18 LAB — TOXASSURE SELECT 13 (MW), URINE

## 2021-09-20 ENCOUNTER — Ambulatory Visit: Payer: Medicare Other | Admitting: Nurse Practitioner

## 2021-09-21 ENCOUNTER — Ambulatory Visit: Payer: Medicare Other | Admitting: Urology

## 2021-09-29 ENCOUNTER — Encounter: Payer: Self-pay | Admitting: Family Medicine

## 2021-10-12 DIAGNOSIS — Z885 Allergy status to narcotic agent status: Secondary | ICD-10-CM | POA: Diagnosis not present

## 2021-10-12 DIAGNOSIS — R9431 Abnormal electrocardiogram [ECG] [EKG]: Secondary | ICD-10-CM | POA: Diagnosis not present

## 2021-10-12 DIAGNOSIS — Z882 Allergy status to sulfonamides status: Secondary | ICD-10-CM | POA: Diagnosis not present

## 2021-10-12 DIAGNOSIS — I4819 Other persistent atrial fibrillation: Secondary | ICD-10-CM | POA: Diagnosis not present

## 2021-10-12 DIAGNOSIS — Z880 Allergy status to penicillin: Secondary | ICD-10-CM | POA: Diagnosis not present

## 2021-10-12 DIAGNOSIS — Z7901 Long term (current) use of anticoagulants: Secondary | ICD-10-CM | POA: Diagnosis not present

## 2021-10-12 DIAGNOSIS — I10 Essential (primary) hypertension: Secondary | ICD-10-CM | POA: Diagnosis not present

## 2021-10-12 DIAGNOSIS — Z79899 Other long term (current) drug therapy: Secondary | ICD-10-CM | POA: Diagnosis not present

## 2021-10-12 DIAGNOSIS — Z881 Allergy status to other antibiotic agents status: Secondary | ICD-10-CM | POA: Diagnosis not present

## 2021-10-14 DIAGNOSIS — I4891 Unspecified atrial fibrillation: Secondary | ICD-10-CM | POA: Diagnosis not present

## 2021-10-24 ENCOUNTER — Other Ambulatory Visit: Payer: Self-pay

## 2021-10-24 MED ORDER — APIXABAN 5 MG PO TABS: 5.0000 mg | ORAL_TABLET | Freq: Two times a day (BID) | ORAL | 5 refills | Status: DC

## 2021-10-24 NOTE — Telephone Encounter (Signed)
Prescription refill request for Eliquis received. Indication:Afib Last office visit:8/23 Scr:0.7 Age: 61 Weight:67.6 kg  Prescription refilled

## 2021-11-01 ENCOUNTER — Encounter: Payer: Self-pay | Admitting: Gastroenterology

## 2021-11-01 DIAGNOSIS — K219 Gastro-esophageal reflux disease without esophagitis: Secondary | ICD-10-CM

## 2021-11-01 MED FILL — Iron Sucrose Inj 20 MG/ML (Fe Equiv): INTRAVENOUS | Qty: 10 | Status: AC

## 2021-11-02 ENCOUNTER — Encounter: Payer: Self-pay | Admitting: Internal Medicine

## 2021-11-02 ENCOUNTER — Inpatient Hospital Stay: Payer: Medicare Other | Attending: Internal Medicine

## 2021-11-02 ENCOUNTER — Inpatient Hospital Stay: Payer: Medicare Other

## 2021-11-02 ENCOUNTER — Inpatient Hospital Stay: Payer: Medicare Other | Admitting: Internal Medicine

## 2021-11-02 DIAGNOSIS — D509 Iron deficiency anemia, unspecified: Secondary | ICD-10-CM | POA: Insufficient documentation

## 2021-11-02 DIAGNOSIS — H02889 Meibomian gland dysfunction of unspecified eye, unspecified eyelid: Secondary | ICD-10-CM | POA: Diagnosis not present

## 2021-11-02 DIAGNOSIS — M549 Dorsalgia, unspecified: Secondary | ICD-10-CM | POA: Diagnosis not present

## 2021-11-02 DIAGNOSIS — K8689 Other specified diseases of pancreas: Secondary | ICD-10-CM | POA: Diagnosis not present

## 2021-11-02 DIAGNOSIS — G8929 Other chronic pain: Secondary | ICD-10-CM | POA: Diagnosis not present

## 2021-11-02 DIAGNOSIS — Z803 Family history of malignant neoplasm of breast: Secondary | ICD-10-CM | POA: Insufficient documentation

## 2021-11-02 DIAGNOSIS — I4891 Unspecified atrial fibrillation: Secondary | ICD-10-CM | POA: Diagnosis not present

## 2021-11-02 DIAGNOSIS — E611 Iron deficiency: Secondary | ICD-10-CM | POA: Diagnosis not present

## 2021-11-02 DIAGNOSIS — Z7901 Long term (current) use of anticoagulants: Secondary | ICD-10-CM | POA: Insufficient documentation

## 2021-11-02 LAB — IRON AND TIBC
Iron: 86 ug/dL (ref 28–170)
Saturation Ratios: 20 % (ref 10.4–31.8)
TIBC: 441 ug/dL (ref 250–450)
UIBC: 355 ug/dL

## 2021-11-02 LAB — CBC WITH DIFFERENTIAL/PLATELET
Abs Immature Granulocytes: 0.01 10*3/uL (ref 0.00–0.07)
Basophils Absolute: 0.1 10*3/uL (ref 0.0–0.1)
Basophils Relative: 1 %
Eosinophils Absolute: 0.1 10*3/uL (ref 0.0–0.5)
Eosinophils Relative: 2 %
HCT: 38.8 % (ref 36.0–46.0)
Hemoglobin: 13.1 g/dL (ref 12.0–15.0)
Immature Granulocytes: 0 %
Lymphocytes Relative: 24 %
Lymphs Abs: 1.8 10*3/uL (ref 0.7–4.0)
MCH: 30.2 pg (ref 26.0–34.0)
MCHC: 33.8 g/dL (ref 30.0–36.0)
MCV: 89.4 fL (ref 80.0–100.0)
Monocytes Absolute: 0.3 10*3/uL (ref 0.1–1.0)
Monocytes Relative: 5 %
Neutro Abs: 5.1 10*3/uL (ref 1.7–7.7)
Neutrophils Relative %: 68 %
Platelets: 342 10*3/uL (ref 150–400)
RBC: 4.34 MIL/uL (ref 3.87–5.11)
RDW: 13 % (ref 11.5–15.5)
WBC: 7.4 10*3/uL (ref 4.0–10.5)
nRBC: 0 % (ref 0.0–0.2)

## 2021-11-02 LAB — BASIC METABOLIC PANEL
Anion gap: 9 (ref 5–15)
BUN: 13 mg/dL (ref 8–23)
CO2: 23 mmol/L (ref 22–32)
Calcium: 8.7 mg/dL — ABNORMAL LOW (ref 8.9–10.3)
Chloride: 105 mmol/L (ref 98–111)
Creatinine, Ser: 0.93 mg/dL (ref 0.44–1.00)
GFR, Estimated: >60 mL/min (ref >60)
Glucose, Bld: 151 mg/dL — ABNORMAL HIGH (ref 70–99)
Potassium: 4 mmol/L (ref 3.5–5.1)
Sodium: 137 mmol/L (ref 135–145)

## 2021-11-02 LAB — FERRITIN: Ferritin: 18 ng/mL (ref 11–307)

## 2021-11-02 NOTE — Assessment & Plan Note (Addendum)
#   Anemia- iron deficiency-[NOV 2022] hemoglobin:11 Ferritin-7 .  Poor tolerance to p.o. iron. S/p IV venofer x4-.  Iron studies FEB 2023- I sat-19%; ferritin-71.  Today hemoglobin is 13.  Hold off on infusion.   #Etiology: Unclear etiology.  Status post EGD/colonoscopy-2022-monitor for now.  # IBS-Diarrhea-?  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  # Hypocalcemia-8.7; continue dietary supplementation.  Patient poor tolerance to calcium plus vitamin D.  # DISPOSITION: # no venofer  # follow up in 6 months- MD: labs- cbc/bmp;iron studies/ferritin- possible IV venofer-Dr.B

## 2021-11-02 NOTE — Progress Notes (Signed)
[place] NOTE  Patient Care Team: Brita Romp Dionne Bucy, MD as PCP - General (Family Medicine) Vickie Epley, MD as PCP - Electrophysiology (Cardiology) Dorann Ou Montine Circle, NP as Nurse Practitioner (Psychiatry) Lucilla Lame, MD as Consulting Physician (Gastroenterology) Jules Husbands, MD as Consulting Physician (General Surgery) Milinda Pointer, MD as Referring Physician (Pain Medicine) Pcp, No Coralyn Helling, DMD (Dentistry) Cammie Sickle, MD as Consulting Physician (Internal Medicine)  CHIEF COMPLAINTS/PURPOSE OF CONSULTATION: ANEMIA  HEMATOLOGY HISTORY:  #Iron deficiency ANEMIA EGD-gastritis/ colonoscopy- lymphoplasmacytic infiltrateNovember 2022 [Dr. Marius Ditch; in hospital]; capsule/Bone marrow Biopsy-none; [history of chronic iron deficiency anemia- s/p IV iron infusions in the past]; CTA CAP: Negative for any source of bleeding noted.  Atrophic pancreas-?  Etiology;   #Question pancreatic insufficiency-on Creon [nov [id number]]  #A. Fib [s/p cardioversion-Eliqu[facility] Dr.Agbor]; #Chronic back pain/pain management    Latest Reference Range & Units 01/14/21 06:15  Iron 28 - 170 ug/dL 30  UIBC ug/dL 502  TIBC 250 - 450 ug/dL 532 (H)  Saturation Ratios 10.4 - 31.8 % 6 (L)  Ferritin 11 - 307 ng/mL 7 (L)  Folate >5.9 ng/mL 14.9    HISTORY OF PRESENTING ILLNESS: Alone ambulating independently.  LUJAIN KRASZEWSKI 61 y.o.  female iron deficient anemia unclear etiology is here for follow-up.  Patient not on oral iron because of intolerance.  Complains of constipation/diarrhea-followed by GI.   Review of Systems  Constitutional:  Positive for malaise/fatigue. Negative for chills, diaphoresis, fever and weight loss.  HENT:  Negative for nosebleeds and sore throat.   Eyes:  Negative for double vision.  Respiratory:  Negative for cough, hemoptysis, sputum production, shortness of breath and wheezing.   Cardiovascular:  Negative for chest pain,  palpitations, orthopnea and leg swelling.  Gastrointestinal:  Positive for diarrhea and nausea. Negative for abdominal pain, blood in stool, constipation, heartburn, melena and vomiting.  Genitourinary:  Negative for dysuria, frequency and urgency.  Musculoskeletal:  Negative for back pain and joint pain.  Skin: Negative.  Negative for itching and rash.  Neurological:  Negative for dizziness, tingling, focal weakness, weakness and headaches.  Endo/Heme/Allergies:  Does not bruise/bleed easily.  Psychiatric/Behavioral:  Negative for depression. The patient is not nervous/anxious and does not have insomnia.     MEDICAL HISTORY:  Past Medical History:  Diagnosis Date  . Abnormal EKG    HX OF INVERTED T WAVES ON EKG, PALPITATIONS, CHEST PAINS-CARDIAC WORK UP DID NOT SHOW ANY HEART DISEASE  . AC (acromioclavicular) joint bone spurs   . Acute postoperative pain 01/04/2017  . 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 Anemia    Iron Infusion-8 yrs ago  . Anxiety   . Asthma   . Cephalalgia 08/18/2014  . Cervical disc disease 08/18/2014   Needs neck surgery.   . Chronic headaches   . Chronic, continuous use of opioids   . DDD (degenerative disc disease)    CERVICAL AND LUMBAR-CHRONIC PAIN, RT HIP LABRAL TEAR  . Depression    PT STATES A LOT OF STRESS IN HER LIFE  . Dissociative disorder   . Dizziness 04/22/2013  . Duodenal ulcer with hemorrhage and perforation ([place]) 04/27/2003  . Foot drop, right    FROM BACK SURGERY  . GERD (gastroesophageal reflux disease)   . H/O arthrodesis 08/18/2014  . Headache(784.0)    AND NECK PAIN--STATES RECENT TEST SHOW CERVICAL DEGENERATION  . History of blood transfusion    s/p back surgery  . History of cardiac cath  a. 05/2019 Cath: Nl cors. EF 55-65%.  . History of cardioversion   . History of cervical spinal surgery 01/04/2015  . History of kidney stones   . Hypertension   . Inverted T wave   . Iron deficiency 02/01/2021  . Mitral regurgitation    a.  07/2020 Echo: EF 60-65%, no rwma. Nl RV size/fxn. RVSP 39.31mHg. Mildly to mod dil LA. Mod MR; b. 07/2020 TEE: EF 55-60%. Lambl's excresence. Nl RV size/fxn. Midly dil LA. No LA/LAA thrombus. Mild MR.  . Narrowing of intervertebral disc space 08/18/2014   Currently on disability.   . Orthostatic hypotension 04/22/2013  . Pain    CHRONIC NECK AND BACK PAIN - LIMITED ROM NECK - S/P FUSIONS CERVICAL AND LUMBAR  . Paroxysmal atrial fibrillation ([place] 2021   a. 07/2020 s/p PVI.  .Marland KitchenPneumonia   . PONV (postoperative nausea and vomiting)    PT GIVES HX OF N&V AND FEVER WITH SURGERIES YEARS AGO--BUT NO PROBLEMS WITH MORE RECENT SURGERIES--STATES NOT MALIGNANT HYPERTHERMIA  . Postop Hyponatremia 05/14/2012  . Postoperative anemia due to acute blood loss 05/14/2012  . PTSD (post-traumatic stress disorder)   . Right foot drop   . 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   . Sleep apnea 2021   does not have a cpap  . Therapeutic opioid-induced constipation ([facility])   . Typical atrial flutter ([place]     SURGICAL HISTORY: Past Surgical History:  Procedure Laterality Date  . ABDOMINAL HYSTERECTOMY    . afib  05/2019  . ANTERIOR CERVICAL DECOMP/DISCECTOMY FUSION N/A 10/30/2012   Procedure: ACDF C5-6, EXPLORATION AND HARDWARE REMOVAL C6-7;  Surgeon: DMelina Schools MD;  [facility]  Service: Orthopedics;  Laterality: N/A;  . ANTERIOR F[facility] SP[facility] ATRIAL FIBRILLATION ABLATION N/A 08/06/2020   Procedure: ATRIAL FIBRILLATION ABLATION;  Surgeon: LVickie Epley MD;  [facility];  Service: Cardiovascular;  Laterality: N/A;  . BACK SURGERY  2009   LUMBAR FUSION WITH RODS   . BREAST BIOPSY Right 2008   benign.- bx/clip  . BREAST EXCISIONAL BIOPSY Left 1998   benign  . BREAST REDUCTION SURGERY Bilateral 06/2016  . CARDIAC ELECTROPHYSIOLOGY MAPPING AND ABLATION    . CARDIOVERSION N/A 12/03/2019   Procedure:  CARDIOVERSION;  Surgeon: AKate Sable MD;  Location: [facility];  Service: Cardiovascular;  Laterality: N/A;  . CARDIOVERSION N/A 08/01/2021   Procedure: CARDIOVERSION;  Surgeon: AKate Sable MD;  Location: [facility];  Service: Cardiovascular;  Laterality: N/A;  . CARDIOVERSION N/A 08/05/2021   Procedure: CARDIOVERSION;  Surgeon: GMinna Merritts MD;  Location: [facility];  Service: Cardiovascular;  Laterality: N/A;  . CARPAL TUNNEL RELEASE  05-06-12   Right  . CHOLECYSTECTOMY    . COLONOSCOPY WITH PROPOFOL N/A 01/26/2017   Procedure: COLONOSCOPY WITH PROPOFOL;  Surgeon: WLucilla Lame MD;  [place]  Service: Endoscopy;  Laterality: N/A;  . COLONOSCOPY WITH PROPOFOL N/A 01/16/2021   Procedure: COLONOSCOPY WITH PROPOFOL;  Surgeon: VLin Landsman MD;  [facility];  Service: Gastroenterology;  Laterality: N/A;  . DIAGNOSTIC LAPAROSCOPIES - MULTIPLE FOR ENDOMETRIOSIS    . ESOPHAGEAL DILATION N/A 01/26/2017   Procedure: ESOPHAGEAL DILATION;  Surgeon: WLucilla Lame MD;  [place]  Service: Endoscopy;  Laterality: N/A;  . ESOPHAGEAL MANOMETRY N/A 05/30/2017   Procedure: ESOPHAGEAL MANOMETRY (EM);  Surgeon: WLucilla Lame MD;  [facility];  Service: Endoscopy;  Laterality: N/A;  . ESOPHAGOGASTRODUODENOSCOPY (EGD) WITH PROPOFOL N/A 01/26/2017   Procedure: ESOPHAGOGASTRODUODENOSCOPY (EGD) WITH PROPOFOL;  Surgeon: Lucilla Lame, MD;  Location: [place];  Service: Endoscopy;  Laterality: N/A;  . ESOPHAGOGASTRODUODENOSCOPY (EGD) WITH PROPOFOL N/A 01/30/2020   Procedure: ESOPHAGOGASTRODUODENOSCOPY (EGD) WITH PROPOFOL;  Surgeon: Lucilla Lame, MD;  Location: [place];  Service: Endoscopy;  Laterality: N/A;  sleep apnea COVID + 01-15-20  . ESOPHAGOGASTRODUODENOSCOPY (EGD) WITH PROPOFOL N/A 01/15/2021   Procedure: ESOPHAGOGASTRODUODENOSCOPY (EGD) WITH PROPOFOL;  Surgeon: Lin Landsman, MD;  Location: [place];  Service: Gastroenterology;  Laterality: N/A;  . HIP ARTHROSCOPY  09/20/2011   Procedure: ARTHROSCOPY HIP;  Surgeon: Gearlean Alf, MD;  [facility];  Service: Orthopedics;  Laterality: Right;  Right Hip Scope with Labral Debridement  . LEFT HEART CATH AND CORONARY ANGIOGRAPHY Left 06/10/2019   Procedure: LEFT HEART CATH AND CORONARY ANGIOGRAPHY;  Surgeon: Nelva Bush, MD;  Location: [place] [facility];  Service: Cardiovascular;  Laterality: Left;  . NASAL SEPTUM SURGERY  MARCH 2013   IN [place]  . [place] IMPEDANCE STUDY N/A 05/30/2017   Procedure: [place] IMPEDANCE STUDY;  Surgeon: Lucilla Lame, MD;  Location: [facility];  Service: Endoscopy;  Laterality: N/A;  . POLYPECTOMY N/A 01/26/2017   Procedure: POLYPECTOMY;  Surgeon: Lucilla Lame, MD;  Location: [place];  Service: Endoscopy;  Laterality: N/A;  . POSTERIOR CERVICAL FUSION/FORAMINOTOMY N/A 04/16/2013   Procedure: REMOVAL CERVICAL PLATES AND INTERBODY CAGE/POSTERIOR CERVICAL SPINAL FUSION C4 - C6/C5 CORPECTOMY/C4 - C6 FUSION WITH ILIAC CREST BONE GRAFT;  Surgeon: Melina Schools, MD;  Location: [place];  Service: Orthopedics;  Laterality: N/A;  . RADIOFREQUENCY ABLATION NERVES    . REDUCTION MAMMAPLASTY Bilateral 05/2016  . RIGHT HIP ARTHROSCOPY FOR LABRAL TEAR  ABOUT 2010   2012 also  . SHOULDER ARTHROSCOPY  05-06-12   bone spur  . TEE WITHOUT CARDIOVERSION N/A 08/05/2020   Procedure: TRANSESOPHAGEAL ECHOCARDIOGRAM (TEE);  Surgeon: Lelon Perla, MD;  Location: [facility];  Service: Cardiovascular;  Laterality: N/A;  . TONSILLECTOMY     AS A CHILD  . TOTAL HIP ARTHROPLASTY Right 05/13/2012   Procedure: TOTAL HIP ARTHROPLASTY ANTERIOR APPROACH;  Surgeon: Gearlean Alf, MD;  [facility];  Service: Orthopedics;  Laterality: Right;    SOCIAL HISTORY: Social History   Socioeconomic History  . Marital status: Married    Spouse name: bruce  . Number of children: 2  . Years of education: Not on file   . Highest education level: Associate degree: occupational, Hotel manager, or vocational program  Occupational History    Comment: disabled  Tobacco Use  . Smoking status: Never  . Smokeless tobacco: Never  Vaping Use  . Vaping Use: Never used  Substance and Sexual Activity  . Alcohol use: No    Alcohol/week: 0.0 standard drinks of alcohol  . Drug use: Yes    Comment: prescribed morphine and xanax  . Sexual activity: Yes  Other Topics Concern  . 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    Social Determinants of Health   Financial Resource Strain: Low Risk  (02/08/2021)   Overall Financial Resource Strain (CARDIA)   . Difficulty of Paying Living Expenses: Not hard at all  Food Insecurity: No Food Insecurity (02/08/2021)  Hunger Vital Sign   . Worried About Charity fundraiser in the Last Year: Never true   . Ran Out of Food in the Last Year: Never true  Transportation Needs: No Transportation Needs (02/08/2021)   PRAPARE - Transportation   . Lack of Transportation (Medical): No   . Lack of Transportation (Non-Medical): No  Physical Activity: Insufficiently Active (02/08/2021)   Exercise Vital Sign   . Days of Exercise per Week: 2 days   . Minutes of Exercise per Session: 20 min  Stress: No Stress Concern Present (02/08/2021)   [place]   . Feeling of Stress : Only a little  Social Connections: Moderately Integrated (02/08/2021)   Social Connection and Isolation Panel [NHANES]   . Frequency of Communication with Friends and Family: More than three times a week   . Frequency of Social Gatherings with Friends and Family: Never   . Attends Religious Services: 1 to 4 times per year   . Active Member of Clubs or Organizations: No   . Attends Archivist  Meetings: Never   . Marital Status: Married  Human resources officer Violence: Not At Risk (02/08/2021)   Humiliation, Afraid, Rape, and Kick questionnaire   . Fear of Current or Ex-Partner: No   . Emotionally Abused: No   . Physically Abused: No   . Sexually Abused: No    FAMILY HISTORY: Family History  Problem Relation Age of Onset  . Aneurysm Mother   . Bipolar disorder Sister   . Aneurysm Maternal Grandmother   . Breast cancer Paternal Grandmother 57  . Bipolar disorder Grandchild   . Anxiety disorder Grandchild   . Depression Grandchild   . Cancer Neg Hx     ALLERGIES:  is allergic to amoxicillin, chlorhexidine gluconate, clindamycin, codeine, erythromycin, penicillin g, sulfa antibiotics, levofloxacin, shellfish allergy, decadron [dexamethasone], mangifera indica, papaya derivatives, betadine [povidone iodine], clarithromycin, other, povidone-iodine, and prednisone.  MEDICATIONS:  Current Outpatient Medications  Medication Sig Dispense Refill  . ALPRAZolam (XANAX) 1 MG tablet Take 1 mg by mouth at bedtime.     Marland Kitchen amiodarone (PACERONE) 200 MG tablet Take 1 tablet (200 mg total) by mouth 2 (two) times daily for 14 days. (Patient taking differently: Take 200 mg by mouth daily.) 28 tablet 0  . apixaban (ELIQUIS) 5 MG TABS tablet Take 1 tablet (5 mg total) by mouth 2 (two) times daily. 60 tablet 5  . busPIRone (BUSPAR) 15 MG tablet Take 15 mg by mouth 2 (two) times daily.    Marland Kitchen CREON 36000-114000 units CPEP capsule Take 2 capsules with the first bite of each meal and 1 capsule with the first bite of each snack 720 capsule 2  . diltiazem (CARDIZEM CD) 120 MG 24 hr capsule Take 1 capsule (120 mg total) by mouth daily. 30 capsule 1  . linaclotide (LINZESS) 145 MCG CAPS capsule TAKE 1 CAPSULE BY MOUTH DAILY BEFORE BREAKFAST. 30 capsule 1  . Menthol, Topical Analgesic, (BENGAY EX) Apply 1 application topically daily as needed (pain).    . montelukast (SINGULAIR) 10 MG tablet TAKE 1 TABLET BY  MOUTH EVERY DAY 90 tablet 1  . morphine (MSIR) 15 MG tablet Take 1 tablet (15 mg total) by mouth every 6 (six) hours as needed for moderate pain or severe pain. Must last 30 days. 120 tablet 0  . morphine (MSIR) 15 MG tablet Take 1 tablet (15 mg total) by mouth every 6 (six) hours as needed  for moderate pain or severe pain. Must last 30 days. 120 tablet 0  . [START ON 11/20/2021] morphine (MSIR) 15 MG tablet Take 1 tablet (15 mg total) by mouth every 6 (six) hours as needed for moderate pain or severe pain. Must last 30 days. 120 tablet 0  . sodium chloride (OCEAN) 0.65 % SOLN nasal spray Place 1 spray into both nostrils daily as needed for congestion ([place]).    . valACYclovir (VALTREX) 1000 MG tablet TAKE TWO TABLETS BY MOUTH TWICE DAILY FOR ONE DAY FEVER BLISTER 4 tablet 5  . amiodarone (PACERONE) 400 MG tablet Take 1 tablet (400 mg total) by mouth 2 (two) times daily for 7 days. (Patient not taking: Reported on 08/15/2021) 14 tablet 0  . colchicine 0.6 MG tablet Take 0.3 mg by mouth daily. (Patient not taking: Reported on 11/02/2021)    . naphazoline-pheniramine (VISINE) 0.025-0.3 % ophthalmic solution 1 drop 4 (four) times daily as needed for eye irritation. (Patient not taking: Reported on 11/02/2021)    . omeprazole (PRILOSEC) 40 MG capsule Take 1 capsule (40 mg total) by mouth 2 (two) times daily before a meal. (Patient not taking: Reported on 11/02/2021) 60 capsule 2  . ondansetron (ZOFRAN-ODT) 4 MG disintegrating tablet Take 1 tablet (4 mg total) by mouth every 8 (eight) hours as needed. (Patient not taking: Reported on 11/02/2021) 20 tablet 0  . polyethylene glycol (MIRALAX / GLYCOLAX) 17 g packet Take 17 g by mouth 2 (two) times daily. (Patient not taking: Reported on 11/02/2021) 14 each 0   No current facility-administered medications for this visit.      PHYSICAL EXAMINATION:   Vitals:   11/02/21 1322  BP: 124/76  Pulse: 67  Resp: 16  Temp: 98.9 F (37.2 C)  SpO2: 98%   Filed  Weights   11/02/21 1322  Weight: 156 lb 12.8 oz (71.1 kg)  Irregular rhythm.  Physical Exam Vitals and nursing note reviewed.  HENT:     Head: Normocephalic and atraumatic.     Mouth/Throat:     Pharynx: Oropharynx is clear.  Eyes:     Extraocular Movements: Extraocular movements intact.     Pupils: Pupils are equal, round, and reactive to light.  Cardiovascular:     Rate and Rhythm: Normal rate.  Pulmonary:     Comments: Decreased breath sounds bilaterally.  Abdominal:     Palpations: Abdomen is soft.  Musculoskeletal:        General: Normal range of motion.     Cervical back: Normal range of motion.  Skin:    General: Skin is warm.  Neurological:     General: No focal deficit present.     Mental Status: She is alert and oriented to person, place, and time.  Psychiatric:        Behavior: Behavior normal.        Judgment: Judgment normal.    LABORATORY DATA:  I have reviewed the data as listed Lab Results  Component Value Date   WBC 7.4 11/02/2021   HGB 13.1 11/02/2021   HCT 38.8 11/02/2021   MCV 89.4 11/02/2021   PLT 342 11/02/2021   Recent Labs    01/19/21 1055 05/04/21 1427 07/27/21 1405 07/28/21 0731 08/05/21 0213 08/08/21 2336 11/02/21 1308  NA 140   < > 139   < > 135 136 137  K 4.2   < > 3.7   < > 4.0 4.1 4.0  CL 105   < > 104   < >  104 102 105  CO2 22   < > 22   < > 24 21* 23  GLUCOSE 93   < > 140*   < > 121* 185* 151*  BUN 7*   < > 12   < > 13 12 13   CREATININE 0.75   < > 0.95   < > 0.74 0.90 0.93  CALCIUM 8.9   < > 9.8   < > 9.2 9.7 8.7*  GFRNONAA  --    < > >60   < > >60 >60 >60  PROT 6.2  --  7.9  --   --  8.0  --   ALBUMIN 3.9  --  4.4  --   --  4.5  --   AST 18  --  19  --   --  21  --   ALT 16  --  11  --   --  14  --   ALKPHOS 135*  --  125  --   --  127*  --   BILITOT 0.3  --  0.9  --   --  0.6  --    < > = values in this interval not displayed.     No results found.  Iron deficiency # Anemia- iron deficiency-[NOV 2022]  hemoglobin:11 Ferritin-7 .  Poor tolerance to p.o. iron. S/p IV venofer x4-.  Iron studies FEB 2023- I sat-19%; ferritin-71.  Today hemoglobin is 13.  Hold off on infusion.   #Etiology: Unclear etiology.  Status post EGD/colonoscopy-2022-monitor for now.  # IBS-Diarrhea-?  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  # Hypocalcemia-8.7; continue dietary supplementation.  Patient poor tolerance to calcium plus vitamin D.  # DISPOSITION: # no venofer  # follow up in 6 months- MD: labs- cbc/bmp;iron studies/ferritin- possible IV venofer-Dr.B  All questions were answered. The patient knows to call the clinic with any problems, questions or concerns.    Cammie Sickle, MD 11/02/2021 1:41 PM

## 2021-11-03 NOTE — Telephone Encounter (Signed)
[place] patient [place]11[place]20[place] [facility] [place] Nothing to eat or drink 3 hours before the scan. Called patient and informed patient of this information and she verbalized understanding

## 2021-11-08 ENCOUNTER — Ambulatory Visit: Payer: Self-pay

## 2021-11-08 NOTE — Chronic Care Management (AMB) (Signed)
   11/08/2021  [name] [birth year] [id number]  Documentation encounter created to complete case transition. Patient reports doing well. Denies current care management needs. The care management team will follow for care coordination as needed.   [facility] [phone]

## 2021-11-11 ENCOUNTER — Ambulatory Visit
Admission: RE | Admit: 2021-11-11 | Discharge: 2021-11-11 | Disposition: A | Discharge status: Home / Self Care | Payer: Medicare Other | Source: Ambulatory Visit | Attending: Gastroenterology | Admitting: Gastroenterology

## 2021-11-11 ENCOUNTER — Other Ambulatory Visit: Payer: Self-pay | Admitting: Gastroenterology

## 2021-11-11 DIAGNOSIS — K219 Gastro-esophageal reflux disease without esophagitis: Secondary | ICD-10-CM | POA: Insufficient documentation

## 2021-11-11 DIAGNOSIS — R1310 Dysphagia, unspecified: Secondary | ICD-10-CM | POA: Diagnosis not present

## 2021-11-15 ENCOUNTER — Telehealth: Payer: Self-pay

## 2021-11-15 ENCOUNTER — Other Ambulatory Visit: Payer: Self-pay

## 2021-11-15 DIAGNOSIS — K219 Gastro-esophageal reflux disease without esophagitis: Secondary | ICD-10-CM

## 2021-11-15 NOTE — Telephone Encounter (Signed)
Patient states she is not feeling any better with the omeprazole. Made EGD for 12/07/2021 was going to 11/17/2021 but patient is on blood thinner. Went over instructions, sent to [employer], and mailed them

## 2021-11-15 NOTE — Telephone Encounter (Signed)
-----   Message from Lin Landsman, MD sent at 11/15/2021  4:33 PM EDT ----- I reviewed the barium esophagogram results.  Recommend upper endoscopy with esophageal biopsies for further evaluation if patient is agreeable Also, please check with her if she is feeling better on omeprazole 40 mg twice daily  RV

## 2021-11-16 ENCOUNTER — Encounter: Payer: Self-pay | Admitting: Gastroenterology

## 2021-11-16 ENCOUNTER — Other Ambulatory Visit: Payer: Self-pay | Admitting: Gastroenterology

## 2021-11-16 MED ORDER — LINACLOTIDE 290 MCG PO CAPS: 290.0000 ug | ORAL_CAPSULE | Freq: Every day | ORAL | 0 refills | Status: DC

## 2021-11-16 NOTE — Telephone Encounter (Signed)
Did not refill Linzess 290 because of it saying it was D/C please advise if it can be filled

## 2021-11-17 ENCOUNTER — Telehealth: Payer: Self-pay

## 2021-11-17 NOTE — Telephone Encounter (Signed)
Per [facility] cardiology patient can stop the blood thinner 48 hours before procedure and restart it the day after procedure. Called patient and patient verbalized understanding

## 2021-12-04 ENCOUNTER — Other Ambulatory Visit: Payer: Self-pay | Admitting: Gastroenterology

## 2021-12-07 ENCOUNTER — Ambulatory Visit
Admission: RE | Admit: 2021-12-07 | Discharge: 2021-12-07 | Disposition: A | Discharge status: Home / Self Care | Payer: Medicare Other | Attending: Gastroenterology | Admitting: Gastroenterology

## 2021-12-07 ENCOUNTER — Encounter
Admission: RE | Disposition: A | Discharge status: Home / Self Care | Payer: Self-pay | Source: Home / Self Care | Attending: Gastroenterology

## 2021-12-07 ENCOUNTER — Encounter: Payer: Self-pay | Admitting: Gastroenterology

## 2021-12-07 ENCOUNTER — Ambulatory Visit: Payer: Medicare Other | Admitting: Anesthesiology

## 2021-12-07 DIAGNOSIS — Z96641 Presence of right artificial hip joint: Secondary | ICD-10-CM | POA: Insufficient documentation

## 2021-12-07 DIAGNOSIS — I10 Essential (primary) hypertension: Secondary | ICD-10-CM | POA: Insufficient documentation

## 2021-12-07 DIAGNOSIS — Z8711 Personal history of peptic ulcer disease: Secondary | ICD-10-CM | POA: Diagnosis not present

## 2021-12-07 DIAGNOSIS — G4730 Sleep apnea, unspecified: Secondary | ICD-10-CM | POA: Insufficient documentation

## 2021-12-07 DIAGNOSIS — I483 Typical atrial flutter: Secondary | ICD-10-CM | POA: Insufficient documentation

## 2021-12-07 DIAGNOSIS — Z9049 Acquired absence of other specified parts of digestive tract: Secondary | ICD-10-CM | POA: Diagnosis not present

## 2021-12-07 DIAGNOSIS — I480 Paroxysmal atrial fibrillation: Secondary | ICD-10-CM | POA: Insufficient documentation

## 2021-12-07 DIAGNOSIS — J45909 Unspecified asthma, uncomplicated: Secondary | ICD-10-CM | POA: Insufficient documentation

## 2021-12-07 DIAGNOSIS — K219 Gastro-esophageal reflux disease without esophagitis: Secondary | ICD-10-CM | POA: Insufficient documentation

## 2021-12-07 DIAGNOSIS — R1310 Dysphagia, unspecified: Secondary | ICD-10-CM | POA: Insufficient documentation

## 2021-12-07 DIAGNOSIS — R1319 Other dysphagia: Secondary | ICD-10-CM | POA: Diagnosis not present

## 2021-12-07 HISTORY — PX: ESOPHAGOGASTRODUODENOSCOPY (EGD) WITH PROPOFOL: SHX5813

## 2021-12-07 SURGERY — ESOPHAGOGASTRODUODENOSCOPY (EGD) WITH PROPOFOL
Anesthesia: General

## 2021-12-07 MED ORDER — SODIUM CHLORIDE 0.9 % IV SOLN
INTRAVENOUS | Status: DC
  Administered 2021-12-07: 1000 mL via INTRAVENOUS

## 2021-12-07 MED ORDER — PROPOFOL 10 MG/ML IV BOLUS
INTRAVENOUS | Status: DC | PRN
  Administered 2021-12-07: 80 mg via INTRAVENOUS
  Administered 2021-12-07 (×2): 20 mg via INTRAVENOUS

## 2021-12-07 MED ORDER — LIDOCAINE HCL (CARDIAC) PF 100 MG/5ML IV SOSY
PREFILLED_SYRINGE | INTRAVENOUS | Status: DC | PRN
  Administered 2021-12-07: 80 mg via INTRAVENOUS

## 2021-12-07 NOTE — Transfer of Care (Signed)
Immediate Anesthesia Transfer of Care Note  Patient: [name]  Procedure(s) Performed: ESOPHAGOGASTRODUODENOSCOPY (EGD) WITH PROPOFOL  Patient Location: PACU  Anesthesia Type:General  Level of Consciousness: awake, alert  and oriented  Airway & Oxygen Therapy: Patient Spontanous Breathing  Post-op Assessment: Report given to RN and Post -op Vital signs reviewed and stable  Post vital signs: Reviewed and stable  Last Vitals:  Vitals Value Taken Time  BP 101/60 12/07/21 0814  Temp 36.6 C 12/07/21 0812  Pulse 57 12/07/21 0814  Resp 16 12/07/21 0814  SpO2 93 % 12/07/21 0814  Vitals shown include unvalidated device data.  Last Pain:  Vitals:   12/07/21 0812  TempSrc: Temporal  PainSc: Asleep         Complications: No notable events documented.

## 2021-12-07 NOTE — Anesthesia Postprocedure Evaluation (Signed)
Anesthesia Post Note  Patient: [name]  Procedure(s) Performed: ESOPHAGOGASTRODUODENOSCOPY (EGD) WITH PROPOFOL  Patient location during evaluation: Endoscopy Anesthesia Type: General Level of consciousness: awake and alert Pain management: pain level controlled Vital Signs Assessment: post-procedure vital signs reviewed and stable Respiratory status: spontaneous breathing, nonlabored ventilation and respiratory function stable Cardiovascular status: blood pressure returned to baseline and stable Postop Assessment: no apparent nausea or vomiting Anesthetic complications: no   No notable events documented.   Last Vitals:  Vitals:   12/07/21 0822 12/07/21 0832  BP:  122/88  Pulse: 61 63  Resp:    Temp:    SpO2: 99% 99%    Last Pain:  Vitals:   12/07/21 0832  TempSrc:   PainSc: 0-No pain                 [place] Ouch

## 2021-12-07 NOTE — H&P (Signed)
Cephas Darby, MD [address]  [place]  [place], [place] [address]  Main: [phone]  Fax: [phone] Pager: [phone]  Primary Care Physician:  Virginia Crews, MD Primary Gastroenterologist:  Dr. Cephas Darby  Pre-Procedure History & Physical: HPI:  [name] is a 61 y.o. female is here for an colonoscopy.   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    Chronic headaches    Chronic, continuous use of opioids    DDD (degenerative disc disease)    CERVICAL AND LUMBAR-CHRONIC PAIN, RT HIP LABRAL TEAR   Depression    PT STATES A LOT OF STRESS IN HER LIFE   Dissociative disorder    Dizziness 04/22/2013   Duodenal ulcer with hemorrhage and perforation ([place]) 04/27/2003   Foot drop, right    FROM BACK SURGERY   GERD (gastroesophageal reflux disease)    H/O arthrodesis 08/18/2014   Headache(784.0)    AND NECK PAIN--STATES RECENT TEST SHOW CERVICAL DEGENERATION   History of blood transfusion    s/p back surgery   History of cardiac cath    a. 05/2019 Cath: Nl cors. EF 55-65%.   History of cardioversion    History of cervical spinal surgery 01/04/2015   History of kidney stones    Hypertension    Inverted T wave    Iron deficiency 02/01/2021   Mitral regurgitation    a. 07/2020 Echo: EF 60-65%, no rwma. Nl RV size/fxn. RVSP 39.33mHg. Mildly to mod dil LA. Mod MR; b. 07/2020 TEE: EF 55-60%. Lambl's excresence. Nl RV size/fxn. Midly dil LA. No LA/LAA thrombus. Mild MR.   Narrowing of intervertebral disc space 08/18/2014   Currently on disability.    Orthostatic hypotension 04/22/2013    Pain    CHRONIC NECK AND BACK PAIN - LIMITED ROM NECK - S/P FUSIONS CERVICAL AND LUMBAR   [place]   a. 07/2020 s/p PVI.   Pneumonia    PONV (postoperative nausea and vomiting)    PT GIVES HX OF N&V AND FEVER WITH SURGERIES YEARS AGO--BUT NO PROBLEMS WITH MORE RECENT SURGERIES--STATES NOT MALIGNANT HYPERTHERMIA   Postop Hyponatremia 05/14/2012   Postoperative anemia due to acute blood loss 05/14/2012   PTSD (post-traumatic stress disorder)    Right foot drop    Right hip arthralgia 08/18/2014   Status post surgery of right, and now needs left.    Sleep apnea 2021   does not have a cpap   Therapeutic opioid-induced constipation (OIC)    Typical atrial flutter ([facility]     Past Surgical History:  Procedure Laterality Date   ABDOMINAL HYSTERECTOMY     afib  05/2019   ANTERIOR CERVICAL DECOMP/DISCECTOMY FUSION N/A 10/30/2012   Procedure: ACDF C5-6, EXPLORATION AND HARDWARE REMOVAL C6-7;  Surgeon: DMelina Schools MD;  [place]  Service: Orthopedics;  Laterality: N/A;   ANTERIOR FUSION [place]ERVICAL SP[place]  [place]Y [place]   [place]   Procedure: ATRIAL FIBRILLATION ABLATION;  Surgeon: LVickie Epley MD;  Location: [place] [facility];  Service: Cardiovascular;  Laterality: N/A;   BACK SURGERY  2009   LUMBAR FUSION WITH RODS    BREAST BIOPSY Right 2008   benign.- bx/clip   BREAST EXCISIONAL BIOPSY Left 1998   benign   BREAST REDUCTION SURGERY Bilateral 06/2016   CARDIAC ELECTROPHYSIOLOGY MAPPING AND ABLATION     CARDIOVERSION N/A 12/03/2019   Procedure: CARDIOVERSION;  Surgeon: Kate Sable, MD;  Location: [place] [facility];  Service: Cardiovascular;  Laterality: N/A;   CARDIOVERSION N/A 08/01/2021   Procedure: CARDIOVERSION;  Surgeon: Kate Sable, MD;  Location: [facility];  Service: Cardiovascular;  Laterality: N/A;   CARDIOVERSION N/A 08/05/2021   Procedure: CARDIOVERSION;  Surgeon: Minna Merritts, MD;   Location: [facility];  Service: Cardiovascular;  Laterality: N/A;   CARPAL TUNNEL RELEASE  05-06-12   Right   CHOLECYSTECTOMY     COLONOSCOPY WITH PROPOFOL N/A 01/26/2017   Procedure: COLONOSCOPY WITH PROPOFOL;  Surgeon: Lucilla Lame, MD;  Location: [place];  Service: Endoscopy;  Laterality: N/A;   COLONOSCOPY WITH PROPOFOL N/A 01/16/2021   Procedure: COLONOSCOPY WITH PROPOFOL;  Surgeon: Lin Landsman, MD;  Location: [facility] ENDOSCOPY;  Service: Gastroenterology;  Laterality: N/A;   DIAGNOSTIC LAPAROSCOPIES - MULTIPLE FOR ENDOMETRIOSIS     ESOPHAGEAL DILATION N/A 01/26/2017   Procedure: ESOPHAGEAL DILATION;  Surgeon: Lucilla Lame, MD;  Location: [place];  Service: Endoscopy;  Laterality: N/A;   ESOPHAGEAL MANOMETRY N/A 05/30/2017   Procedure: ESOPHAGEAL MANOMETRY (EM);  Surgeon: Lucilla Lame, MD;  Location: [facility] ENDOSCOPY;  Service: Endoscopy;  Laterality: N/A;   ESOPHAGOGASTRODUODENOSCOPY (EGD) WITH PROPOFOL N/A 01/26/2017   Procedure: ESOPHAGOGASTRODUODENOSCOPY (EGD) WITH PROPOFOL;  Surgeon: Lucilla Lame, MD;  Location: [place];  Service: Endoscopy;  Laterality: N/A;   ESOPHAGOGASTRODUODENOSCOPY (EGD) WITH PROPOFOL N/A 01/30/2020   Procedure: ESOPHAGOGASTRODUODENOSCOPY (EGD) WITH PROPOFOL;  Surgeon: Lucilla Lame, MD;  Location: [place];  Service: Endoscopy;  Laterality: N/A;  sleep apnea COVID + 01-15-20   ESOPHAGOGASTRODUODENOSCOPY (EGD) WITH PROPOFOL N/A 01/15/2021   Procedure: ESOPHAGOGASTRODUODENOSCOPY (EGD) WITH PROPOFOL;  Surgeon: Lin Landsman, MD;  Location: [place];  Service: Gastroenterology;  Laterality: N/A;   HIP ARTHROSCOPY  09/20/2011   Procedure: ARTHROSCOPY HIP;  Surgeon: Gearlean Alf, MD;  [facility];  Service: Orthopedics;  Laterality: Right;  Right Hip Scope with Labral Debridement   LEFT HEART CATH AND CORONARY ANGIOGRAPHY Left 06/10/2019   Procedure: LEFT HEART CATH AND CORONARY ANGIOGRAPHY;  Surgeon: Nelva Bush, MD;  Location: [place] CV LAB;  Service: Cardiovascular;  Laterality: Left;   NASAL SEPTUM SURGERY  MARCH 2013   IN [place]   [facility] IMPEDANCE STUDY N/A 05/30/2017   Procedure: [place] IMPEDANCE STUDY;  Surgeon: Lucilla Lame, MD;  Location: [facility]OSCOPY;  Service: Endoscopy;  Laterality: N/A;   POLYPECTOMY N/A 01/26/2017   Procedure: POLYPECTOMY;  Surgeon: Lucilla Lame, MD;  Location: [place];  Service: Endoscopy;  Laterality: N/A;   POSTERIOR CERVICAL FUSION/FORAMINOTOMY N/A 04/16/2013   Procedure: REMOVAL CERVICAL PLATES AND INTERBODY CAGE/POSTERIOR CERVICAL SPINAL FUSION C4 - C6/C5 CORPECTOMY/C4 - C6 FUSION WITH ILIAC CREST BONE GRAFT;  Surgeon: Melina Schools, MD;  Location: [place];  Service: Orthopedics;  Laterality: N/A;   RADIOFREQUENCY ABLATION NERVES     REDUCTION MAMMAPLASTY Bilateral 05/2016   RIGHT HIP ARTHROSCOPY FOR LABRAL TEAR  ABOUT 2010   2012 also   SHOULDER ARTHROSCOPY  05-06-12   bone spur   TEE WITHOUT CARDIOVERSION N/A 08/05/2020   Procedure:  TRANSESOPHAGEAL ECHOCARDIOGRAM (TEE);  Surgeon: Lelon Perla, MD;  Location: [facility] ENDOSCOPY;  Service: Cardiovascular;  Laterality: N/A;   TONSILLECTOMY     AS A CHILD   TOTAL HIP ARTHROPLASTY Right 05/13/2012   Procedure: TOTAL HIP ARTHROPLASTY ANTERIOR APPROACH;  Surgeon: Gearlean Alf, MD;  [facility];  Service: Orthopedics;  Laterality: Right;    Prior to Admission medications   Medication Sig Start Date End Date Taking? Authorizing Provider  ALPRAZolam Duanne Moron) 1 MG tablet Take 1 mg by mouth at bedtime.  07/04/19  Yes [provider]  busPIRone (BUSPAR) 15 MG tablet Take 15 mg by mouth 2 (two) times daily. 07/20/21  Yes [provider]  CREON 36000-114000 units CPEP capsule TAKE 2 CAPSULES WITH THE FIRST BITE OF [facility] MEAL AND 1 CAPSULE WITH THE FIRST BITE OF EACH SNACK 12/05/21  Yes Rosalio Catterton, Tally Due, MD  diltiazem (CARDIZEM CD) 120 MG 24 hr capsule Take 1 capsule (120 mg total)  by mouth daily. 06/27/21  Yes Kate Sable, MD  linaclotide Rolan Lipa) 290 MCG CAPS capsule Take 1 capsule (290 mcg total) by mouth daily before breakfast. 11/16/21  Yes Briley Bumgarner, Tally Due, MD  naphazoline-pheniramine (VISINE) 0.025-0.3 % ophthalmic solution 1 drop 4 (four) times daily as needed for eye irritation.   Yes [provider]  omeprazole (PRILOSEC) 40 MG capsule TAKE 1 CAPSULE (40 MG TOTAL) BY MOUTH 2 (TWO) TIMES DAILY BEFORE A MEAL. 12/05/21  Yes Sheree Lalla, Tally Due, MD  amiodarone (PACERONE) 200 MG tablet Take 1 tablet (200 mg total) by mouth 2 (two) times daily for 14 days. Patient taking differently: Take 200 mg by mouth daily. 08/13/21 11/02/21  Nolberto Hanlon, MD  amiodarone (PACERONE) 400 MG tablet Take 1 tablet (400 mg total) by mouth 2 (two) times daily for 7 days. Patient not taking: Reported on 08/15/2021 08/05/21 08/12/21  Nolberto Hanlon, MD  apixaban (ELIQUIS) 5 MG TABS tablet Take 1 tablet (5 mg total) by mouth 2 (two) times daily. 10/24/21   Kate Sable, MD  colchicine 0.6 MG tablet Take 0.3 mg by mouth daily. Patient not taking: Reported on 11/02/2021 08/30/21   [provider]  Menthol, Topical Analgesic, (BENGAY EX) Apply 1 application topically daily as needed (pain). Patient not taking: Reported on 12/07/2021    [provider]  montelukast (SINGULAIR) 10 MG tablet TAKE 1 TABLET BY MOUTH EVERY DAY 08/30/21   Bacigalupo, Dionne Bucy, MD  morphine (MSIR) 15 MG tablet Take 1 tablet (15 mg total) by mouth every 6 (six) hours as needed for moderate pain or severe pain. Must last 30 days. 09/21/21 11/02/21  Milinda Pointer, MD  morphine (MSIR) 15 MG tablet Take 1 tablet (15 mg total) by mouth every 6 (six) hours as needed for moderate pain or severe pain. Must last 30 days. 10/21/21 11/20/21  Milinda Pointer, MD  morphine (MSIR) 15 MG tablet Take 1 tablet (15 mg total) by mouth every 6 (six) hours as needed for moderate pain or severe pain. Must last 30  days. 11/20/21 12/20/21  Milinda Pointer, MD  ondansetron (ZOFRAN-ODT) 4 MG disintegrating tablet Take 1 tablet (4 mg total) by mouth every 8 (eight) hours as needed. Patient not taking: Reported on 11/02/2021 08/09/21   Vladimir Crofts, MD  polyethylene glycol (MIRALAX / GLYCOLAX) 17 g packet Take 17 g by mouth 2 (two) times daily. Patient not taking: Reported on 11/02/2021 08/05/21   Nolberto Hanlon, MD  sodium chloride (OCEAN) 0.65 % SOLN nasal spray Place 1  spray into both nostrils daily as needed for congestion ([place]).    [provider]  valACYclovir (VALTREX) 1000 MG tablet TAKE TWO TABLETS BY MOUTH TWICE DAILY FOR ONE DAY FEVER BLISTER 06/13/21   Virginia Crews, MD    Allergies as of 11/16/2021 - Review Complete 11/02/2021  Allergen Reaction Noted   Amoxicillin Hives 08/20/2014   Chlorhexidine gluconate Dermatitis and Hives 07/06/2016   Clindamycin Hives 08/20/2014   Codeine Hives 08/21/2011   Erythromycin Hives 07/06/2016   Penicillin g Hives 07/06/2016   Sulfa antibiotics Nausea And Vomiting and Hives 09/06/2011   Levofloxacin Hives 02/18/2018   Shellfish allergy Hives 10/08/2012   Decadron [dexamethasone] Other (See Comments) 09/20/2011   Mangifera indica Hives 07/20/2016   Papaya derivatives Hives 10/30/2012   Betadine [povidone iodine] Hives 09/06/2011   Clarithromycin Hives 09/08/2011   Other Hives 09/06/2011   Povidone-iodine Hives 09/06/2011   Prednisone Anxiety 08/18/2014    Family History  Problem Relation Age of Onset   Aneurysm Mother    Bipolar disorder Sister    Aneurysm Maternal Grandmother    Breast cancer Paternal Grandmother 3   Bipolar disorder Grandchild    Anxiety disorder Grandchild    Depression Grandchild    Cancer Neg Hx     Social History   Socioeconomic History   Marital status: Married    Spouse name: bruce   Number of children: 2   Years of education: Not on file   Highest education level: Associate degree: occupational,  Hotel manager, or vocational program  Occupational History    Comment: disabled  Tobacco Use   Smoking status: Never   Smokeless tobacco: Never  Vaping Use   Vaping Use: Never used  Substance and Sexual Activity   Alcohol use: No    Alcohol/week: 0.0 standard drinks of alcohol   Drug use: Yes    Comment: prescribed morphine and xanax   Sexual activity: Yes  Other Topics Concern   Not on file  Social History Narrative   Son, daughter  And 4 grandkids; raises 3 of the grandchildren      Lives in [place]; with husband; never smoked; no alcohol; 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    Social Determinants of Health   Financial Resource Strain: Low Risk  (02/08/2021)   Overall Financial Resource Strain (CARDIA)    Difficulty of Paying Living Expenses: Not hard at all  Food Insecurity: No Food Insecurity (02/08/2021)   Hunger Vital Sign    Worried About Running Out of Food in the Last Year: Never true    Ran Out of Food in the Last Year: Never true  Transportation Needs: No Transportation Needs (02/08/2021)   PRAPARE - Hydrologist (Medical): No    Lack of Transportation (Non-Medical): No  Physical Activity: Insufficiently Active (02/08/2021)   Exercise Vital Sign    Days of Exercise per Week: 2 days    Minutes of Exercise per Session: 20 min  Stress: No Stress Concern Present (02/08/2021)   [place]    Feeling of Stress : Only a little  Social Connections: Moderately Integrated (02/08/2021)   Social Connection and Isolation Panel [NHANES]    Frequency of Communication with Friends and Family: More than three times a week    Frequency of Social Gatherings with Friends and Family: Never    Attends Religious Services: 1 to 4 times per year    Active Member of [employer] or Organizations:  No    Attends Club or Organization Meetings: Never    Marital Status: Married  Human resources officer Violence: Not At Risk  (02/08/2021)   Humiliation, Afraid, Rape, and Kick questionnaire    Fear of Current or Ex-Partner: No    Emotionally Abused: No    Physically Abused: No    Sexually Abused: No    Review of Systems: See HPI, otherwise negative ROS  Physical Exam: BP 134/86   Pulse 70   Temp (!) 96.6 F (35.9 C) (Temporal)   Resp 16   Ht '5\' 4"'$  (1.626 m)   Wt 70.5 kg   SpO2 97%   BMI 26.66 kg/m  General:   Alert,  pleasant and cooperative in NAD Head:  Normocephalic and atraumatic. Neck:  Supple; no masses or thyromegaly. Lungs:  Clear throughout to auscultation.    Heart:  Regular rate and rhythm. Abdomen:  Soft, nontender and nondistended. Normal bowel sounds, without guarding, and without rebound.   Neurologic:  Alert and  oriented x4;  grossly normal neurologically.  Impression/Plan: HAYDAN WEDIG is here for an colonoscopy to be performed for abnormal esophagogram  Risks, benefits, limitations, and alternatives regarding  endoscopy have been reviewed with the patient.  Questions have been answered.  All parties agreeable.   Sherri Sear, MD  12/07/2021, 7:50 AM

## 2021-12-07 NOTE — Anesthesia Preprocedure Evaluation (Addendum)
Anesthesia Evaluation  Patient identified by MRN, date of birth, ID band Patient awake    Reviewed: Allergy & Precautions, NPO status , Patient's Chart, lab work & pertinent test results  History of Anesthesia Complications (+) PONV and history of anesthetic complications  Airway Mallampati: III  TM Distance: <3 FB Neck ROM: full    Dental no notable dental hx.    Pulmonary asthma , sleep apnea ,    Pulmonary exam normal        Cardiovascular hypertension, (-) angina Rhythm:Regular Rate:Normal  07/2020 TEE: EF 55-60%. Lambl's excresence. Nl RV size/fxn. Midly dil LA. No LA/LAA thrombus. Mild MR.  S/p cardioversion for afib   Neuro/Psych  Headaches, PSYCHIATRIC DISORDERS Anxiety  Neuromuscular disease    GI/Hepatic Neg liver ROS, PUD, GERD  Controlled,  Endo/Other  negative endocrine ROS  Renal/GU Renal disease  negative genitourinary   Musculoskeletal   Abdominal   Peds  Hematology negative hematology ROS (+)   Anesthesia Other Findings Past Medical History: No date: Abnormal EKG     Comment:  HX OF INVERTED T WAVES ON EKG, PALPITATIONS, CHEST               PAINS-CARDIAC WORK UP DID NOT SHOW ANY HEART DISEASE No date: AC (acromioclavicular) joint bone spurs 01/04/2017: Acute postoperative pain 08/15/2004: Addison anemia No date: Anemia     Comment:  Iron Infusion-8 yrs ago No date: Anxiety No date: Asthma 08/18/2014: Cephalalgia 08/18/2014: Cervical disc disease     Comment:  Needs neck surgery.  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 05/2019 Cath: Nl cors. EF 55-65%. No date: History of cardioversion 01/04/2015: History of cervical spinal surgery No date: History of kidney stones No date: Hypertension No date: Inverted T wave 02/01/2021: Iron deficiency No date: Mitral regurgitation     Comment:  a. 07/2020 Echo: EF 60-65%, no rwma. Nl RV size/fxn. RVSP              39.63mHg. Mildly to mod dil LA. Mod MR; b. 07/2020 TEE: EF              55-60%. Lambl's excresence. Nl RV size/fxn. Midly dil LA.              No LA/LAA thrombus. Mild MR. 08/18/2014: Narrowing of intervertebral disc space     Comment:  Currently on disability.  04/22/2013: Orthostatic hypotension No date: Pain     Comment:  CHRONIC NECK AND BACK PAIN - LIMITED ROM NECK - S/P               FUSIONS CERVICAL AND LUMBAR 2021: Paroxysmal atrial fibrillation ([place]     Comment:  a. 07/2020  s/p PVI. No date: Pneumonia No date: PONV (postoperative nausea and vomiting)     Comment:  PT GIVES HX OF N&V AND FEVER WITH SURGERIES YEARS               AGO--BUT NO PROBLEMS WITH MORE RECENT SURGERIES--STATES               NOT MALIGNANT HYPERTHERMIA 05/14/2012: Postop Hyponatremia 05/14/2012: Postoperative anemia due to acute blood loss No date: PTSD (post-traumatic stress disorder) No date: Right foot drop 08/18/2014: Right hip arthralgia     Comment:  Status post surgery of right, and now needs left.  2021: Sleep apnea     Comment:  does not have a cpap No date: Therapeutic opioid-induced constipation (OIC) No date: Typical atrial flutter [facility])  Past Surgical History: No date: ABDOMINAL HYSTERECTOMY 05/2019: afib 10/30/2012: ANTERIOR  CERVICAL DECOMP/DISCECTOMY FUSION; N/A     Comment:  Procedure: ACDF C5-6, EXPLORATION AND HARDWARE REMOVAL               C6-7;  Surgeon: Melina Schools, MD;  Location: [place];                Service: Orthopedics;  Laterality: N/A; MAY 2012: ANTERIOR FUSION CERVICAL SPINE     Comment:  AT [facility] 08/06/2020: ATRIAL FIBRILLATION ABLATION; N/A     Comment:  Procedure: ATRIAL FIBRILLATION ABLATION;  Surgeon:               Vickie Epley, MD;  Location: [place] CV LAB;                Service: Cardiovascular;  Laterality: N/A; 2009: BACK SURGERY     Comment:  LUMBAR FUSION WITH RODS  2008: BREAST BIOPSY; Right     Comment:  benign.- bx/clip 1998: BREAST EXCISIONAL BIOPSY; Left     Comment:  benign 06/2016: BREAST REDUCTION SURGERY; Bilateral No date: CARDIAC ELECTROPHYSIOLOGY MAPPING AND ABLATION 12/03/2019: CARDIOVERSION; N/A     Comment:  Procedure: CARDIOVERSION;  Surgeon: Kate Sable,               MD;  Location: [facility];  Service: Cardiovascular;                Laterality: N/A; 05-06-12: CARPAL TUNNEL RELEASE     Comment:  Right No date: CHOLECYSTECTOMY 01/26/2017: COLONOSCOPY WITH PROPOFOL; N/A     Comment:  Procedure: COLONOSCOPY WITH PROPOFOL;  Surgeon: Lucilla Lame, MD;  Location: [place];  Service:               Endoscopy;  Laterality: N/A; 01/16/2021: COLONOSCOPY WITH PROPOFOL; N/A     Comment:  Procedure: COLONOSCOPY WITH PROPOFOL;  Surgeon: Lin Landsman, MD;  Location: [facility];  Service:               Gastroenterology;  Laterality: N/A; No date: DIAGNOSTIC LAPAROSCOPIES - MULTIPLE FOR ENDOMETRIOSIS 01/26/2017: ESOPHAGEAL DILATION; N/A     Comment:  Procedure: ESOPHAGEAL DILATION;  Surgeon: Lucilla Lame,               MD;  Location: [place];  Service: Endoscopy;               Laterality: N/A; 05/30/2017: ESOPHAGEAL MANOMETRY; N/A     Comment:  Procedure: ESOPHAGEAL MANOMETRY (EM);  Surgeon: Allen Norris,  Darren, MD;  Location: [place] END[facility]COPY;  Service:               Endoscopy;  Laterality: N/A; 01/26/2017: ESOPHAGOGASTRODUODENOSCOPY (EGD) WITH PROPOFOL; N/A     Comment:  Procedure: ESOPHAGOGASTRODUODENOSCOPY (EGD) WITH               PROPOFOL;  Surgeon: Lucilla Lame, MD;  Location: [place];  Service: Endoscopy;  Laterality: N/A; 01/30/2020: ESOPHAGOGASTRODUODENOSCOPY (EGD) WITH PROPOFOL; N/A     Comment:  Procedure: ESOPHAGOGASTRODUODENOSCOPY (EGD) WITH               PROPOFOL;  Surgeon: Lucilla Lame, MD;  Location: [place];  Service: Endoscopy;  Laterality: N/A;                sleep apnea COVID + 01-15-20 01/15/2021: ESOPHAGOGASTRODUODENOSCOPY (EGD) WITH PROPOFOL; N/A     Comment:  Procedure: ESOPHAGOGASTRODUODENOSCOPY (EGD) WITH               PROPOFOL;  Surgeon: Lin Landsman, MD;  Location:               [place];  Service: Gastroenterology;  Laterality:               N/A; 09/20/2011: HIP ARTHROSCOPY     Comment:  Procedure: ARTHROSCOPY HIP;  Surgeon: Gearlean Alf,               MD;  [facility];  Service: Orthopedics;                Laterality: Right;  Right Hip Scope with Labral               Debridement 06/10/2019: LEFT HEART CATH AND CORONARY ANGIOGRAPHY; Left     Comment:  Procedure: LEFT HEART CATH AND CORONARY ANGIOGRAPHY;                Surgeon: Nelva Bush, MD;  Location: [place]               CV LAB;  Service: Cardiovascular;  Laterality: Left; MARCH 2013: NASAL SEPTUM SURGERY     Comment:  IN [place] 05/30/2017: PH IMPEDANCE STUDY; N/A     Comment:  Procedure: [place] IMPEDANCE STUDY;  Surgeon: Lucilla Lame,               MD;  Location: [facility];  Service: Endoscopy;                Laterality: N/A; 01/26/2017: POLYPECTOMY; N/A     Comment:  Procedure: POLYPECTOMY;  Surgeon: Lucilla Lame, MD;                Location: [place];  Service: Endoscopy;                 Laterality: N/A; 04/16/2013: POSTERIOR CERVICAL FUSION/FORAMINOTOMY; N/A     Comment:  Procedure: REMOVAL CERVICAL PLATES AND INTERBODY               CAGE/POSTERIOR CERVICAL SPINAL FUSION C4 - C6/C5               CORPECTOMY/C4 - C6 FUSION WITH ILIAC CREST BONE GRAFT;                Surgeon: Melina Schools, MD;  Location: [place];  Service:  Orthopedics;  Laterality: N/A; No date: RADIOFREQUENCY ABLATION NERVES 05/2016: REDUCTION MAMMAPLASTY; Bilateral ABOUT 2010: RIGHT HIP ARTHROSCOPY FOR LABRAL TEAR     Comment:  2012 also 05-06-12: SHOULDER ARTHROSCOPY     Comment:  bone spur 08/05/2020: TEE WITHOUT CARDIOVERSION; N/A     Comment:  Procedure: TRANSESOPHAGEAL ECHOCARDIOGRAM (TEE);                Surgeon: Lelon Perla, MD;  Location: [facility] ENDOSCOPY;               Service: Cardiovascular;  Laterality: N/A; No date: TONSILLECTOMY     Comment:  AS A CHILD 05/13/2012: TOTAL HIP ARTHROPLASTY; Right     Comment:  Procedure: TOTAL HIP ARTHROPLASTY ANTERIOR APPROACH;                Surgeon: Gearlean Alf, MD;  [facility];                Service: Orthopedics;  Laterality: Right;  BMI    Body Mass Index: 26.43 kg/m      Reproductive/Obstetrics negative OB ROS                            Anesthesia Physical  Anesthesia Plan  ASA: 3  Anesthesia Plan: General   Post-op Pain Management:    Induction: Intravenous  PONV Risk Score and Plan: Propofol infusion and TIVA  Airway Management Planned: Natural Airway and Nasal Cannula  Additional Equipment:   Intra-op Plan:   Post-operative Plan:   Informed Consent: I have reviewed the patients History and Physical, chart, labs and discussed the procedure including the risks, benefits and alternatives for the proposed anesthesia with the patient or authorized representative who has indicated his/her understanding and acceptance.     Dental Advisory Given  Plan Discussed with: Anesthesiologist,  CRNA and Surgeon  Anesthesia Plan Comments:        Anesthesia Quick Evaluation

## 2021-12-07 NOTE — Op Note (Signed)
[facility] Patient Name: [name] Procedure Date: 12/07/2021 7:58 AM MRN: [id number] Account #: [id number] Date of Birth: 30-Sep-1960 Admit Type: Outpatient Age: 61 Room: [facility] ENDO ROOM 4 Gender: Female Note Status: Finalized Instrument Name: Upper Endoscope [id number] Procedure:             Upper GI endoscopy Indications:           Dysphagia, Abnormal UGI series Providers:             Lin Landsman MD, MD Referring MD:          Lin Landsman MD, MD (Referring MD), Dionne Bucy.                         Bacigalupo (Referring MD) Medicines:             General Anesthesia Complications:         No immediate complications. Estimated blood loss: None. Procedure:             Pre-Anesthesia Assessment:                        - Prior to the procedure, a History and Physical was                         performed, and patient medications and allergies were                         reviewed. The patient is competent. The risks and                         benefits of the procedure and the sedation options and                         risks were discussed with the patient. All questions                         were answered and informed consent was obtained.                         Patient identification and proposed procedure were                         verified by the physician, the nurse, the                         anesthesiologist, the anesthetist and the technician                         in the pre-procedure area in the procedure room in the                         endoscopy suite. Mental Status Examination: alert and                         oriented. Airway Examination: normal oropharyngeal                         airway and neck mobility. Respiratory Examination:  clear to auscultation. CV Examination: normal.                         Prophylactic Antibiotics: The patient does not require                          prophylactic antibiotics. Prior Anticoagulants: The                         patient has taken Eliquis (apixaban), last dose was 4                         days prior to procedure. ASA Grade Assessment: III - A                         patient with severe systemic disease. After reviewing                         the risks and benefits, the patient was deemed in                         satisfactory condition to undergo the procedure. The                         anesthesia plan was to use general anesthesia.                         Immediately prior to administration of medications,                         the patient was re-assessed for adequacy to receive                         sedatives. The heart rate, respiratory rate, oxygen                         saturations, blood pressure, adequacy of pulmonary                         ventilation, and response to care were monitored                         throughout the procedure. The physical status of the                         patient was re-assessed after the procedure.                        After obtaining informed consent, the endoscope was                         passed under direct vision. Throughout the procedure,                         the patient's blood pressure, pulse, and oxygen                         saturations were monitored continuously. The Endoscope  was introduced through the mouth, and advanced to the                         second part of duodenum. The upper GI endoscopy was                         accomplished without difficulty. The patient tolerated                         the procedure well. Findings:      The duodenal bulb and second portion of the duodenum were normal.      The entire examined stomach was normal.      Evidence of a Nissen fundoplication was found in the cardia. The wrap       appeared intact. This was traversed.      The gastroesophageal junction and examined esophagus were  normal.       Biopsies were taken with a cold forceps for histology. Impression:            - Normal duodenal bulb and second portion of the                         duodenum.                        - Normal stomach.                        - A Nissen fundoplication was found. The wrap appears                         intact.                        - Normal gastroesophageal junction and esophagus.                         Biopsied. Recommendation:        - Await pathology results.                        - Discharge patient to home (with spouse).                        - Resume previous diet today.                        - Continue present medications.                        - Resume Eliquis (apixaban) today at prior dose. Refer                         to managing physician for further adjustment of                         therapy. Procedure Code(s):     --- Professional ---                        [phone], Esophagogastroduodenoscopy, flexible,  transoral; with biopsy, single or multiple CPT copyright 2019 [employer]. All rights reserved. The codes documented in this report are preliminary and upon coder review may  be revised to meet current compliance requirements. Dr. Ulyess Mort Lin Landsman MD, MD 12/07/2021 8:12:56 AM This report has been signed electronically. Number of Addenda: 0 Note Initiated On: 12/07/2021 7:58 AM Estimated Blood Loss:  Estimated blood loss: none.      [facility]

## 2021-12-08 ENCOUNTER — Encounter: Payer: Self-pay | Admitting: Gastroenterology

## 2021-12-09 LAB — SURGICAL PATHOLOGY

## 2021-12-14 ENCOUNTER — Ambulatory Visit
Admission: RE | Admit: 2021-12-14 | Discharge: 2021-12-14 | Disposition: A | Discharge status: Home / Self Care | Payer: Medicare Other | Attending: Pain Medicine | Admitting: Pain Medicine

## 2021-12-14 ENCOUNTER — Ambulatory Visit (HOSPITAL_BASED_OUTPATIENT_CLINIC_OR_DEPARTMENT_OTHER): Payer: Medicare Other | Admitting: Pain Medicine

## 2021-12-14 ENCOUNTER — Encounter: Payer: Self-pay | Admitting: Pain Medicine

## 2021-12-14 ENCOUNTER — Ambulatory Visit
Admission: RE | Admit: 2021-12-14 | Discharge: 2021-12-14 | Disposition: A | Discharge status: Home / Self Care | Payer: Medicare Other | Source: Ambulatory Visit | Attending: Pain Medicine | Admitting: Pain Medicine

## 2021-12-14 VITALS — BP 128/82 | HR 81 | Temp 97.9°F | Ht 64.0 in | Wt 155.0 lb

## 2021-12-14 DIAGNOSIS — Z9189 Other specified personal risk factors, not elsewhere classified: Secondary | ICD-10-CM | POA: Insufficient documentation

## 2021-12-14 DIAGNOSIS — M961 Postlaminectomy syndrome, not elsewhere classified: Secondary | ICD-10-CM

## 2021-12-14 DIAGNOSIS — M47816 Spondylosis without myelopathy or radiculopathy, lumbar region: Secondary | ICD-10-CM

## 2021-12-14 DIAGNOSIS — Z79899 Other long term (current) drug therapy: Secondary | ICD-10-CM | POA: Insufficient documentation

## 2021-12-14 DIAGNOSIS — M542 Cervicalgia: Secondary | ICD-10-CM | POA: Insufficient documentation

## 2021-12-14 DIAGNOSIS — M7918 Myalgia, other site: Secondary | ICD-10-CM

## 2021-12-14 DIAGNOSIS — M25511 Pain in right shoulder: Secondary | ICD-10-CM

## 2021-12-14 DIAGNOSIS — M545 Low back pain, unspecified: Secondary | ICD-10-CM

## 2021-12-14 DIAGNOSIS — M25552 Pain in left hip: Secondary | ICD-10-CM | POA: Diagnosis present

## 2021-12-14 DIAGNOSIS — G8929 Other chronic pain: Secondary | ICD-10-CM | POA: Insufficient documentation

## 2021-12-14 DIAGNOSIS — G894 Chronic pain syndrome: Secondary | ICD-10-CM

## 2021-12-14 DIAGNOSIS — G8928 Other chronic postprocedural pain: Secondary | ICD-10-CM | POA: Insufficient documentation

## 2021-12-14 DIAGNOSIS — M549 Dorsalgia, unspecified: Secondary | ICD-10-CM | POA: Insufficient documentation

## 2021-12-14 DIAGNOSIS — Z96641 Presence of right artificial hip joint: Secondary | ICD-10-CM | POA: Diagnosis present

## 2021-12-14 DIAGNOSIS — Z9889 Other specified postprocedural states: Secondary | ICD-10-CM

## 2021-12-14 DIAGNOSIS — M25551 Pain in right hip: Secondary | ICD-10-CM

## 2021-12-14 DIAGNOSIS — Z79891 Long term (current) use of opiate analgesic: Secondary | ICD-10-CM

## 2021-12-14 DIAGNOSIS — G4486 Cervicogenic headache: Secondary | ICD-10-CM

## 2021-12-14 DIAGNOSIS — M25512 Pain in left shoulder: Secondary | ICD-10-CM

## 2021-12-14 MED ORDER — NALOXONE HCL 4 MG/0.1ML NA LIQD: 1.0000 | NASAL | 0 refills | Status: DC | PRN

## 2021-12-14 MED ORDER — MORPHINE SULFATE 15 MG PO TABS: 15.0000 mg | ORAL_TABLET | Freq: Four times a day (QID) | ORAL | 0 refills | Status: DC | PRN

## 2021-12-14 NOTE — Progress Notes (Signed)
PROVIDER NOTE: Information contained herein reflects review and annotations entered in association with encounter. Interpretation of such information and data should be left to medically-trained personnel. Information provided to patient can be located elsewhere in the medical record under "Patient Instructions". Document created using STT-dictation technology, any transcriptional errors that may result from process are unintentional.    Patient: [name]  Service Category: E/M  Provider: Gaspar Cola, MD  DOB: [birth year]  DOS: 12/14/2021  Referring Provider: Virginia Crews, MD  MRN: [id number]  Specialty: Interventional Pain Management  PCP: Virginia Crews, MD  Type: Established Patient  Setting: Ambulatory outpatient    Location: Office  Delivery: Face-to-face     HPI  [name], a 61 y.o. year old female, is here today because of her Chronic pain syndrome [G89.4]. [name] primary complain today is Hip Pain (right) Last encounter: My last encounter with her was on 09/12/2021. Pertinent problems: 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); Chronic hip pain (2ry area of Pain) (Bilateral) (L>R); Chronic shoulder arthropathy (Left); Trigger point of shoulder region (Left);  Enthesopathy of hip region (Left); Groin pain, chronic, left; Other intervertebral disc degeneration, lumbar region; History of lumbar surgery; DDD (degenerative disc disease), lumbosacral; Neuropathic pain; Neurogenic pain; Spondylosis without myelopathy or radiculopathy, lumbosacral region; Chronic musculoskeletal pain; Cervical radiculitis (C4,C5,C8) (Left); DDD (degenerative disc disease), cervical; Foraminal stenosis of cervical region (C3-4) (Right); 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); Abnormal MRI, shoulder (03/20/2017); Chronic neck pain with history of cervical spinal surgery; Enthesopathy of shoulder (Left); Osteoarthritis of shoulder (Left); Symptoms referable to shoulder joint; Tendinopathy of rotator cuff (Left); Sprain of supraspinatus muscle or tendon, sequela (Left); Subdeltoid bursitis of shoulder (Left); Subacromial bursitis of shoulder (Left); Chronic shoulder pain (Left); Abnormal MRI, cervical spine (05/12/2020); Chronic upper back pain; Chronic shoulder pain (Right); Osteoarthritis of shoulder (Right); Arthropathy of shoulder (Right); C7 radiculopathy (Right); Cervical radiculopathy at C8 (Right); Muscle weakness of upper extremity (Right); Weakness of upper extremity (Right); Cervical fusion syndrome; Failed back syndrome of cervical spine; Cervicalgia; and Chronic hip pain after total replacement (THR) (Right) on their pertinent problem list. Pain Assessment: Severity of Chronic pain is reported as a 8 /10. Location: Hip Right/pain radiaities down right leg and right groin. Onset: 1 to 4 weeks ago. Quality: Aching, Burning, Constant, Sharp, Squeezing. Timing: Constant. Modifying factor(s): rest and meds, heat. Vitals:  height is 5' 4" (1.626 m) and weight is 155 lb (70.3 kg). Her temperature is 97.9 F (36.6 C). Her blood pressure is 128/82 and her pulse is 81. Her oxygen saturation is 100%.   Reason for encounter: medication management.  The patient indicates  doing well with the current medication regimen. No adverse reactions or side effects reported to the medications.   The patient describes lately having quite a bit of pain in the area of the right hip, which she states was replaced 7 to 9 years ago.  She  also complains of pain in the right groin area and going over her right thigh as well as low back pain.  She does have a history of prior back surgery with extensive fusion and hardware.  Because this pain can be coming from either the right hip or the lower back, today I will be ordering some x-rays of those 2 areas to make sure that there are normal problems with her hardware.  RTCB: 03/20/2022  Pharmacotherapy Assessment  Analgesic: Morphine IR 15 mg 1 tab p.o. every 6 hours (60 mg/day of morphine) MME/day: 60 mg/day.   Monitoring: [place] PMP: PDMP reviewed during this encounter.       Pharmacotherapy: No side-effects or adverse reactions reported. Compliance: No problems identified. Effectiveness: Clinically acceptable.  [name], [name], RN  12/14/2021 12:54 PM  Sign when Signing Visit Nursing Pain Medication Assessment:  Safety precautions to be maintained throughout the outpatient stay will include: orient to surroundings, keep bed in low position, maintain call bell within reach at all times, provide assistance with transfer out of bed and ambulation.  Medication Inspection Compliance: Pill count conducted under aseptic conditions, in front of the patient. Neither the pills nor the bottle was removed from the patient's sight at any time. Once count was completed pills were immediately returned to the patient in their original bottle.  Medication: Morphine IR Pill/Patch Count:  52 of 120 pills remain Pill/Patch Appearance: Markings consistent with prescribed medication Bottle Appearance: Standard pharmacy container. Clearly labeled. Filled Date: 9 / 14 / 2023 Last Medication intake:  TodaySafety precautions to be maintained throughout the  outpatient stay will include: orient to surroundings, keep bed in low position, maintain call bell within reach at all times, provide assistance with transfer out of bed and ambulation.     No results found for: "CBDTHCR" No results found for: "D8THCCBX" No results found for: "D9THCCBX"  UDS:  Summary  Date Value Ref Range Status  09/12/2021 Note  Final    Comment:    ==================================================================== [employer] Select 13 (MW) ==================================================================== Test                             Result       Flag       Units  Drug Present and Declared for Prescription Verification   Alprazolam                     434          EXPECTED   ng/mg creat   Alpha-hydroxyalprazolam        264          EXPECTED   ng/mg creat    Source of alprazolam is a scheduled prescription medication. Alpha-    hydroxyalprazolam is an expected metabolite of alprazolam.    Morphine                       >11111       EXPECTED   ng/mg creat   Normorphine                    951          EXPECTED   ng/mg creat    Potential sources of large amounts of morphine in the absence of    codeine include administration of morphine or use of heroin.     Normorphine is an expected metabolite of morphine.      Hydromorphone                  407          EXPECTED   ng/mg creat    Hydromorphone may be present as a metabolite of morphine;    concentrations of hydromorphone rarely exceed 5% of the morphine    concentration when this is the source of hydromorphone.  ==================================================================== Test                      Result    Flag   Units      Ref Range   Creatinine              90               mg/dL      >=20 ==================================================================== Declared Medications:  The flagging and interpretation on this report are based on the  following declared medications.  Unexpected results  may arise from  inaccuracies in the declared medications.   **Note: The testing scope of this panel includes these medications:   Alprazolam (Xanax)  Morphine (MSIR)   **Note: The testing scope of this panel does not include the  following reported medications:   Amiodarone (Pacerone)  Apixaban (Eliquis)  Buspirone (Buspar)  Clonidine  Diltiazem  Eye Drops  Linaclotide (Linzess)  Menthol  Montelukast (Singulair)  Omeprazole (Prilosec)  Ondansetron (Zofran)  Pancrelipase (Creon)  Polyethylene Glycol (MiraLAX)  Sodium Chloride  Valacyclovir (Valtrex) ==================================================================== For clinical consultation, please call [phone]. ====================================================================       ROS  Constitutional: Denies any fever or chills Gastrointestinal: No reported hemesis, hematochezia, vomiting, or acute GI distress Musculoskeletal: Denies any acute onset joint swelling, redness, loss of ROM, or weakness Neurological: No reported episodes of acute onset apraxia, aphasia, dysarthria, agnosia, amnesia, paralysis, loss of coordination, or loss of consciousness  Medication Review  ALPRAZolam, Menthol (Topical Analgesic), amiodarone, apixaban, busPIRone, diltiazem, linaclotide, lipase/protease/amylase, montelukast, morphine, naloxone, naphazoline-pheniramine, omeprazole, polyethylene glycol, sodium chloride, and valACYclovir  History Review  Allergy: Ms. Wilhite is allergic to amoxicillin, chlorhexidine gluconate, clindamycin, codeine, erythromycin, penicillin g, sulfa antibiotics, levofloxacin, shellfish allergy, decadron [dexamethasone], mangifera indica, papaya derivatives, betadine [povidone iodine], clarithromycin, other, povidone-iodine, and prednisone. Drug: Ms. Daponte  reports current drug use. Alcohol:  reports no history of alcohol use. Tobacco:  reports that she has never smoked. She has never used smokeless  tobacco. Social: Ms. Neyra  reports that she has never smoked. She has never used smokeless tobacco. She reports current drug use. She reports that she does not drink alcohol. 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), Anemia, Anxiety, Asthma, Cephalalgia (08/18/2014), Cervical disc disease (08/18/2014), Chronic headaches, Chronic, continuous use of opioids, DDD (degenerative disc disease), Depression, Dissociative disorder, Dizziness (04/22/2013), Duodenal ulcer with hemorrhage and perforation ([place]) (04/27/2003), Foot drop, right, GERD (gastroesophageal reflux disease), H/O arthrodesis (08/18/2014), Headache(784.0), History of blood transfusion, History of cardiac cath, History of cardioversion, History of cervical spinal surgery (01/04/2015), History of kidney stones, Hypertension, Inverted T wave, Iron deficiency (02/01/2021), Mitral regurgitation, Narrowing of intervertebral disc space (08/18/2014), Orthostatic hypotension (04/22/2013), Pain, Paroxysmal atrial fibrillation ([place]) (2021), Pneumonia, PONV (postoperative nausea and vomiting), Postop Hyponatremia (05/14/2012), Postoperative anemia due to acute blood loss (05/14/2012), PTSD (post-traumatic stress disorder), Right foot drop, Right hip arthralgia (08/18/2014), Sleep apnea (2021), Therapeutic opioid-induced constipation ([place]), and Typical atrial flutter ([place]). Surgical: Ms. Luckenbaugh  has a past surgical history that includes RIGHT HIP ARTHROSCOPY FOR LABRAL TEAR (ABOUT 2010); Anterior fusion cervical spine (MAY 2012); Nasal septum surgery (MARCH 2013); Back surgery (2009); Tonsillectomy; Abdominal hysterectomy; DIAGNOSTIC LAPAROSCOPIES - MULTIPLE FOR ENDOMETRIOSIS; Cholecystectomy; Hip arthroscopy (09/20/2011); Shoulder arthroscopy (05-06-12); Carpal tunnel release (05-06-12); Total hip arthroplasty (Right, 05/13/2012); Anterior cervical  decomp/discectomy fusion (N/A, 10/30/2012); Posterior cervical fusion/foraminotomy (N/A, 04/16/2013); Breast reduction surgery (Bilateral, 06/2016); Colonoscopy with propofol (N/A, 01/26/2017); Esophagogastroduodenoscopy (egd) with propofol (N/A, 01/26/2017); Esophageal dilation (N/A, 01/26/2017); polypectomy (N/A, 01/26/2017); PH impedance study (N/A, 05/30/2017); Esophageal manometry (N/A, 05/30/2017); Radiofrequency ablation nerves; afib (05/2019); LEFT HEART CATH AND CORONARY ANGIOGRAPHY (Left, 06/10/2019); Cardioversion (N/A, 12/03/2019); Esophagogastroduodenoscopy (egd) with propofol (N/A, 01/30/2020); Reduction mammaplasty (Bilateral, 05/2016); Breast biopsy (Right, 2008); Breast excisional biopsy (Left, 1998); TEE without cardioversion (N/A, 08/05/2020); ATRIAL FIBRILLATION ABLATION (N/A, 08/06/2020); Cardiac electrophysiology mapping and ablation; Esophagogastroduodenoscopy (egd) with propofol (N/A, 01/15/2021); Colonoscopy with propofol (N/A, 01/16/2021); Cardioversion (N/A, 08/01/2021); Cardioversion (N/A, 08/05/2021); and Esophagogastroduodenoscopy (egd) with propofol (N/A, 12/07/2021). Family: family history includes Aneurysm in her maternal grandmother and mother; Anxiety disorder in her grandchild; Bipolar disorder in her grandchild and sister; Breast cancer (age of onset: 53) in her paternal grandmother; Depression in her grandchild.  Laboratory Chemistry Profile   Renal Lab Results  Component Value Date   BUN 13 11/02/2021   CREATININE 0.93 11/02/2021   BCR 9 (L) 01/19/2021   GFRAA >60 11/26/2019   GFRNONAA >60 11/02/2021    Hepatic Lab Results  Component Value Date   AST 21 08/08/2021   ALT 14 08/08/2021   ALBUMIN 4.5 08/08/2021   ALKPHOS 127 (H) 08/08/2021   LIPASE 26 08/08/2021    Electrolytes Lab Results  Component Value Date   NA 137 11/02/2021   K 4.0 11/02/2021   CL 105 11/02/2021   CALCIUM 8.7 (L) 11/02/2021   MG 2.3 08/08/2021    Bone Lab Results  Component Value Date    25OHVITD1 23 (L) 11/11/2018   25OHVITD2 9.8 11/11/2018   25OHVITD3 13 11/11/2018    Inflammation (CRP: Acute Phase) (ESR: Chronic Phase) Lab Results  Component Value Date   CRP 12 (H) 11/11/2018   ESRSEDRATE 42 (H) 11/11/2018   LATICACIDVEN 1.0 01/14/2021         Note: Above Lab results reviewed.  Recent Imaging Review  DG ESOPHAGUS W SINGLE CM (SOL OR THIN BA) CLINICAL DATA:  Patient with a 2 to 3 month history of reflux, dysphagia and pain in her esophagus while eating or drinking. Patient developed atrial fibrillation several months ago which caused many episodes of dry heaving. Patient is now status post cardiac ablation but her GI symptoms are worsening. Patient with a history of prior Nissen fundoplication December, 2021.  EXAM: ESOPHAGUS/BARIUM SWALLOW/TABLET STUDY  TECHNIQUE: Combined double and single contrast examination was performed using effervescent crystals, high-density barium, and thin liquid barium. This exam was performed by Jamie Covington, NP, and was supervised and interpreted by Dr. Veazey.  FLUOROSCOPY: Radiation Exposure Index (as provided by the fluoroscopic device): 27.1 mGy Kerma  COMPARISON:  DG esophagus 01/12/2020.  Chest CT 01/14/2021.  FINDINGS: Swallowing: Appears normal. No vestibular penetration or aspiration seen.  Pharynx: Unremarkable.  Esophagus: The proximal esophagus appears normal.  Esophageal motility: Normal primary peristalsis while upright. Occasional mild tertiary contractions observed. Slow motility while patient laying prone.  Hiatal Hernia: Postsurgical changes at the GE junction related to prior Nissen fundoplication with narrowing and possible mucosal irregularities. No evidence of leak or mass.  Gastroesophageal reflux:   None visualized.  Ingested 13mm barium tablet: Not given, patient states she can only swallow small pills and has to break up larger pills.  Other: Patient endorsed a moderate amount of  pain and discomfort throughout the exam. She described a sensation of her esophagus feeling like it was on fire. Postsurgical changes are noted within the cervical and lumbar spine.  IMPRESSION: Postsurgical changes related to prior Nissen fundoplication with possible mild mucosal irregularity near the GE junction. No high-grade stricture or leak identified. The proximal esophagus appears normal with occasional tertiary contractions.  Patient endorsed a moderate amount of pain and discomfort throughout the exam. Endoscopic follow-up should be considered.  Read by: Jamie Covington, NP  Electronically Signed   By: William  Veazey M.D.   On: 11/11/2021 11:10 Note: Reviewed        Physical Exam  General appearance: Well nourished, well developed, and well hydrated. In no apparent acute distress Mental status: Alert, oriented x 3 (person, place, & time)       Respiratory: No evidence of acute respiratory distress Eyes: PERLA Vitals: BP 128/82   Pulse 81   Temp 97.9 F (36.6 C)   Ht 5' 4" (1.626 m)   Wt 155 lb (70.3 kg)   SpO2 100%   BMI 26.61 kg/m  BMI: Estimated body mass index is 26.61 kg/m as calculated from the following:   Height as of this encounter: 5' 4" (1.626 m).   Weight as of this encounter: 155 lb (70.3 kg). Ideal: Ideal body weight: 54.7 kg (120 lb 9.5 oz) Adjusted ideal body weight: 60.9 kg (134 lb 5.7 oz)  Assessment   Diagnosis Status  1. Chronic pain syndrome   2. Chronic low back pain (1ry area of Pain) (Bilateral) (L>R) w/o sciatica   3. Chronic hip pain (2ry area of Pain) (Bilateral) (L>R)   4. Chronic shoulder pain (3ry area of Pain) (Bilateral) (L>R)   5. Cervicalgia   6. Chronic neck pain with history of cervical spinal surgery   7. Cervicogenic headache   8. Chronic musculoskeletal pain   9. Chronic upper back pain   10. Failed back syndrome of cervical spine   11. 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 Brooks)   12. Lumbar facet  syndrome (Bilateral) (L>R)   13. Pharmacologic therapy   14. High risk medication use   15. At risk for respiratory depression due to opioid   16. Chronic use of opiate for therapeutic purpose   17. Long term prescription opiate use   18. Long term prescription benzodiazepine use   19. Encounter for medication management   20. Encounter for chronic pain management   21. History of total hip replacement (THR) (Right)   22. Chronic hip pain after total replacement (THR) (Right)    Controlled Controlled Controlled   Updated Problems: Problem  Chronic hip pain after total replacement (THR) (Right)   Patient refers that the replacement was done approximately 7-9 years ago.   High Risk Medication Use  At Risk for Respiratory Depression Due to Opioid     Plan of Care  Problem-specific:  No problem-specific Assessment & Plan notes found for this encounter.  Ms. Zhavia D Morrissette has a current medication list which includes the following long-term medication(s): apixaban, diltiazem, linaclotide, montelukast, [START ON 12/20/2021] morphine, [START ON 01/19/2022] morphine, [START ON 02/18/2022] morphine, omeprazole, and amiodarone.  Pharmacotherapy (Medications Ordered): Meds ordered this encounter  Medications   morphine (MSIR) 15 MG tablet    Sig:   Take 1 tablet (15 mg total) by mouth every 6 (six) hours as needed for moderate pain or severe pain. Must last 30 days.    Dispense:  120 tablet    Refill:  0    DO NOT: delete (not duplicate); no partial-fill (will deny script to complete), no refill request (F/U required). DISPENSE: 1 day early if closed on fill date. WARN: No CNS-depressants within 8 hrs of med.   morphine (MSIR) 15 MG tablet    Sig: Take 1 tablet (15 mg total) by mouth every 6 (six) hours as needed for moderate pain or severe pain. Must last 30 days.    Dispense:  120 tablet    Refill:  0    DO NOT: delete (not duplicate); no partial-fill (will deny script to complete),  no refill request (F/U required). DISPENSE: 1 day early if closed on fill date. WARN: No CNS-depressants within 8 hrs of med.   morphine (MSIR) 15 MG tablet    Sig: Take 1 tablet (15 mg total) by mouth every 6 (six) hours as needed for moderate pain or severe pain. Must last 30 days.    Dispense:  120 tablet    Refill:  0    DO NOT: delete (not duplicate); no partial-fill (will deny script to complete), no refill request (F/U required). DISPENSE: 1 day early if closed on fill date. WARN: No CNS-depressants within 8 hrs of med.   naloxone (NARCAN) nasal spray 4 mg/0.1 mL    Sig: Place 1 spray into the nose as needed for up to 365 doses (for opioid-induced respiratory depresssion). In case of emergency (overdose), spray once into each nostril. If no response within 3 minutes, repeat application and call [phone].    Dispense:  1 each    Refill:  0    Instruct patient in proper use of device.   Orders:  Orders Placed This Encounter  Procedures   DG HIP UNILAT W OR W/O PELVIS 2-3 VIEWS RIGHT    Please describe any evidence of DJD, such as joint narrowing, asymmetry, cysts, or any anomalies in bone density, production, or erosion.    Standing Status:   Future    Standing Expiration Date:   01/14/2022    Scheduling Instructions:     Imaging must be done as soon as possible. Inform patient that order will expire within 30 days and I will not renew it.    Order Specific Question:   Reason for Exam (SYMPTOM  OR DIAGNOSIS REQUIRED)    Answer:   Right hip pain/arthralgia    Order Specific Question:   Preferred imaging location?    Answer:   [facility]    Order Specific Question:   Call Results- Best Contact Number?    Answer:   [phone] ([facility])    Order Specific Question:   Release to patient    Answer:   Immediate   DG Lumbar Spine Complete W/Bend    Patient presents with axial pain with possible radicular component. Please assist [place] in identifying specific level(s) and  laterality of any additional findings such as: 1. Facet (Zygapophyseal) joint DJD (Hypertrophy, space narrowing, subchondral sclerosis, and/or osteophyte formation) 2. DDD and/or IVDD (Loss of disc height, desiccation, gas patterns, osteophytes, endplate sclerosis, or "Black disc disease") 3. Pars defects 4. Spondylolisthesis, spondylosis, and/or spondyloarthropathies (include Degree/Grade of displacement in mm) (stability) 5. Vertebral body Fractures (acute/chronic) (state percentage of collapse) 6. Demineralization (osteopenia/osteoporotic) 7. Bone pathology 8. Foraminal narrowing  9.  Surgical changes    Standing Status:   Future    Standing Expiration Date:   01/14/2022    Scheduling Instructions:     Imaging must be done as soon as possible. Inform patient that order will expire within 30 days and I will not renew it.    Order Specific Question:   Reason for Exam (SYMPTOM  OR DIAGNOSIS REQUIRED)    Answer:   Low back pain    Order Specific Question:   Preferred imaging location?    Answer:   [facility]    Order Specific Question:   Call Results- Best Contact Number?    Answer:   [phone] ([facility])    Order Specific Question:   Radiology Contrast Protocol - do NOT remove file path    Answer:   [email or web address]    Order Specific Question:   Release to patient    Answer:   Immediate   Follow-up plan:   Return in about 3 months (around 03/20/2022) for Eval-day (M,W), (F2F), (MM).     Interventional Therapies  Risk  Complexity Considerations:   Estimated body mass index is 25.4 kg/m as calculated from the following:   Height as of this encounter: 5' 4" (1.626 m).   Weight as of this encounter: 148 lb (67.1 kg). Risk  Complexity Considerations:   1. ELIQUIS ANTICOAGULATION: (Stop: 3 days  Restart: 6 hours) 2. CONTRAST & SHELLFISH ALLERGY.  3. Dexamethasone & Prednisone ALLERGY.  (NO STEROIDS)  4. NOT a candidate for  lumbar facet RFA (due to hardware). 5. NOT a candidate for membrane stabilizers (Neurontin/Lyrica) due to prior failed trials. 6. NOT a candidate for a lumbar SCS (due to HARDWARE). 7. NOT a candidate for IT Pump (patient's choice due to surgical risks). 8. NOT a candidate for a lcervical SCS (due to posterior fusion).       Planned  Pending:      Under consideration:   Diagnostic right suprascapular NB #3 (without steroids)    Completed (No Steroids):   Diagnostic bilateral lumbar facet MBB x2 (No Steroids)) (01/14/2019) (100/100/100/0)  Palliative bilateral suprascapular NB x2 (No Steroids) (06/19/2016) (80/80/0)  Palliative left suprascapular nerve RFA x1 (01/04/2017) (100/100/0. Reinjured shoulder on 02/01/2017)    Therapeutic  Palliative (PRN) options:   Diagnostic bilateral lumbar facet MBB #2  Palliative bilateral suprascapular NB #3 (No Steroids) Palliative left suprascapular nerve RFA #2        Recent Visits No visits were found meeting these conditions. Showing recent visits within past 90 days and meeting all other requirements Today's Visits Date Type Provider Dept  12/14/21 Office Visit Naveira, Francisco, MD [facility]  Showing today's visits and meeting all other requirements Future Appointments No visits were found meeting these conditions. Showing future appointments within next 90 days and meeting all other requirements  I discussed the assessment and treatment plan with the patient. The patient was provided an opportunity to ask questions and all were answered. The patient agreed with the plan and demonstrated an understanding of the instructions.  Patient advised to call back or seek an in-person evaluation if the symptoms or condition worsens.  Duration of encounter: 30 minutes.  Total time on encounter, as per AMA guidelines included both the face-to-face and non-face-to-face time personally spent by the physician and/or other qualified  health care professional(s) on the day of the encounter (includes time in activities that require the physician or other qualified health care professional and does not include time   in activities normally performed by clinical staff). Physician's time may include the following activities when performed: preparing to see the patient (eg, review of tests, pre-charting review of records) obtaining and/or reviewing separately obtained history performing a medically appropriate examination and/or evaluation counseling and educating the patient/family/caregiver ordering medications, tests, or procedures referring and communicating with other health care professionals (when not separately reported) documenting clinical information in the electronic or other health record independently interpreting results (not separately reported) and communicating results to the patient/ family/caregiver care coordination (not separately reported)  Note by: Gaspar Cola, MD Date: 12/14/2021; Time: 1:03 PM

## 2021-12-14 NOTE — Patient Instructions (Signed)
____________________________________________________________________________________________  Pharmacy Shortages of Pain Medication   Introduction Shockingly as it may seem, .  "No [place] Supreme Court decision has ever interpreted the Constitution as guaranteeing a right to health care for all Americans." - https://www.healthequityandpolicylab.com/elusive-right-to-health-care-under-us-law  "With respect to human rights, the [place] has no formally codified right to health, nor does it participate in a human rights treaty that specifies a right to health." - Scott J. Schweikart, JD, MBE  Situation By now, most of our patients have had the experience of being told by their pharmacist that they do not have enough medication to cover their prescription. If you have not had this experience, just know that you soon will.  Problem There appears to be a shortage of these medications, either at the national level or locally. This is happening with all pharmacies. When there is not enough medication, patients are offered a partial fill and they are told that they will try to get the rest of the medicine for them at a later time. If they do not have enough for even a partial fill, the pharmacists are telling the patients to call us (the prescribing physicians) to request that we send another prescription to another pharmacy to get the medicine.   This reordering of a controlled substance creates documentation problems where additional paperwork needs to be created to explain why two prescriptions for the same period of time and the same medicine are being prescribed to the same patient. It also creates situations where the last appointment note does not accurately reflect when and what prescriptions were given to a patient. This leads to prescribing errors down the line, in subsequent follow-up visits.   [place] Board [employer]acy ([place]) Research revealed that Board of Pharmacy Rule .1806 (21  [place]AC 46.1806) authorizes pharmacists to the transfer of prescriptions among pharmacies, and it sets forth procedural and recordkeeping requirements for doing so. However, this requires the pharmacist to complete the previously mentioned procedural paperwork to accomplish the transfer. As it turns out, it is much easier for them to have the prescribing physicians do the work.   Possible solutions 1. You can ask your physician to assist you in weaning yourself off these medications. 2. Ask your pharmacy if the medication is in stock, 3 days prior to your refill. 3. If you need a pharmacy change, let us know at your medication management visit. Prescriptions that have already been electronically sent to a pharmacy will not be re-sent to a different pharmacy if your pharmacy of record does not have it in stock. Proper stocking of medication is a pharmacy problem, not a prescriber problem. Work with your pharmacist to solve the problem. 4. Have the [place] Assembly add a provision to the "STOP ACT" (the law that mandates how controlled substances are prescribed) where there is an exception to the electronic prescribing rule that states that in the event there are shortages of medications the physicians are allowed to use written prescriptions as opposed to electronic ones. This would allow patients to take their prescriptions to a different pharmacy that may have enough medication available to fill the prescription. The problem is that currently there is a law that does not allow for written prescriptions, with the exception of instances where the electronic medical record is down due to technical issues.  5. Have [place] Congress ease the pressure on pharmaceutical companies, allowing them to produce enough quantities of the medication to adequately supply the population. 6. Have pharmacies keep enough   stocks of these medications to cover their client base.  7. Have the [place] Assembly add  a provision to the "STOP ACT" where they ease the regulations surrounding the transfer of controlled substances between pharmacies, so as to simplify the transfer of supplies. As an alternative, develop a system to allow patients to obtain the remainder of their prescription at another one of their pharmacies or at an associate pharmacy.   How this shortage will affect you.  Understand that this is a pharmacy supply problem, not a prescriber problem. Work with your pharmacy to solve it. The job of the prescriber is to evaluate and monitor the patient for the appropriate indications and use of these medicines. It is not the job of the prescriber to supply the medication or to solve problems with that supply. The responsibility and the choice to obtain the medication resides on the patient. By law, supplying the medication is the job of the pharmacy. It is certainly not the job of the prescriber to solve supply problems.   Due to the above problems we are no longer taking patients to write for their pain medication. Future discussions with your physician may include potentially weaning medications or transitioning to alternatives.  We will be focusing primarily on interventional based pain management. We will continue to evaluate for appropriate indications and we may provide recommendations regarding medication, dose, and schedule, as well as monitoring recommendations, however, we will not be taking over the actual prescribing of these substances. On those patients where we are treating their chronic pain with interventional therapies, exceptions will be considered on a case by case basis. At this time, we will try to continue providing this supplemental service to those patients we have been managing in the past. However, as of August 1st, 2023, we no longer will be sending additional prescriptions to other pharmacies for the purpose of solving their supply problems. Once we send a prescription to a pharmacy,  we will not be resending it again to another pharmacy to cover for their shortages.   What to do. Write as many letters as you can. Recruit the help of family members in writing these letters. Below are some of the places where you can write to make your voice heard. Let them know what the problem is and push them to look for solutions.   Search internet for: "[place] find your legislators" https://www.ncleg.gov/findyourlegislators  Search internet for: "[place] insurance commissioner complaints" https://www.ncdoi.gov/contactscomplaints/assistance-or-file-complaint  Search internet for: "[place] Board [employer] Pharmacy complaints" http://www.ncbop.org/contact.htm  Search internet for: "[facility] pharmacy complaints" Email [facility] Customer Relations https://www.[facility].com/help/email-customer-relations.jsp?callType=store  Search internet for: "[facility] pharmacy customer service complaints" https://www.[facility].com/topic/marketing/contactus/contactus_customerservice.jsp  ____________________________________________________________________________________________  ____________________________________________________________________________________________  Medication Rules  Purpose: To inform patients, and their family members, of our rules and regulations.  Applies to: All patients receiving prescriptions (written or electronic).  Pharmacy of record: Pharmacy where electronic prescriptions will be sent. If written prescriptions are taken to a different pharmacy, please inform the nursing staff. The pharmacy listed in the electronic medical record should be the one where you would like electronic prescriptions to be sent.  Electronic prescriptions: In compliance with the [place] Strengthen Opioid Misuse Prevention (STOP) Act of 2017 (Session Law 2017-74/H243), effective March 13, 2018, all controlled substances must be electronically prescribed. Calling prescriptions  to the pharmacy will cease to exist.  Prescription refills: Only during scheduled appointments. Applies to all prescriptions.  NOTE: The following applies primarily to controlled substances (Opioid* Pain Medications).     Type of encounter (visit): For patients receiving controlled substances, face-to-face visits are required. (Not an option or up to the patient.)  Patient's responsibilities: Pain Pills: Bring all pain pills to every appointment (except for procedure appointments). Pill Bottles: Bring pills in original pharmacy bottle. Always bring the newest bottle. Bring bottle, even if empty. Medication refills: You are responsible for knowing and keeping track of what medications you take and those you need refilled. The day before your appointment: write a list of all prescriptions that need to be refilled. The day of the appointment: give the list to the admitting nurse. Prescriptions will be written only during appointments. No prescriptions will be written on procedure days. If you forget a medication: it will not be "Called in", "Faxed", or "electronically sent". You will need to get another appointment to get these prescribed. No early refills. Do not call asking to have your prescription filled early. Prescription Accuracy: You are responsible for carefully inspecting your prescriptions before leaving our office. Have the discharge nurse carefully go over each prescription with you, before taking them home. Make sure that your name is accurately spelled, that your address is correct. Check the name and dose of your medication to make sure it is accurate. Check the number of pills, and the written instructions to make sure they are clear and accurate. Make sure that you are given enough medication to last until your next medication refill appointment. Taking Medication: Take medication as prescribed. When it comes to controlled substances, taking less pills or less frequently than prescribed  is permitted and encouraged. Never take more pills than instructed. Never take medication more frequently than prescribed.  Inform other Doctors: Always inform, all of your healthcare providers, of all the medications you take. Pain Medication from other Providers: You are not allowed to accept any additional pain medication from any other Doctor or Healthcare provider. There are two exceptions to this rule. (see below) In the event that you require additional pain medication, you are responsible for notifying us, as stated below. Cough Medicine: Often these contain an opioid, such as codeine or hydrocodone. Never accept or take cough medicine containing these opioids if you are already taking an opioid* medication. The combination may cause respiratory failure and death. Medication Agreement: You are responsible for carefully reading and following our Medication Agreement. This must be signed before receiving any prescriptions from our practice. Safely store a copy of your signed Agreement. Violations to the Agreement will result in no further prescriptions. (Additional copies of our Medication Agreement are available upon request.) Laws, Rules, & Regulations: All patients are expected to follow all Federal and State Laws, Statutes, Rules, & Regulations. Ignorance of the Laws does not constitute a valid excuse.  Illegal drugs and Controlled Substances: The use of illegal substances (including, but not limited to marijuana and its derivatives) and/or the illegal use of any controlled substances is strictly prohibited. Violation of this rule may result in the immediate and permanent discontinuation of any and all prescriptions being written by our practice. The use of any illegal substances is prohibited. Adopted CDC guidelines & recommendations: Target dosing levels will be at or below 60 MME/day. Use of benzodiazepines** is not recommended.  Exceptions: There are only two exceptions to the rule of not  receiving pain medications from other Healthcare Providers. Exception #1 (Emergencies): In the event of an emergency (i.e.: accident requiring emergency care), you are allowed to receive additional pain medication. However, you are responsible for: As soon as   you are able, call our office [phone], at any time of the day or night, and leave a message stating your name, the date and nature of the emergency, and the name and dose of the medication prescribed. In the event that your call is answered by a member of our staff, make sure to document and save the date, time, and the name of the person that took your information.  Exception #2 (Planned Surgery): In the event that you are scheduled by another doctor or dentist to have any type of surgery or procedure, you are allowed (for a period no longer than 30 days), to receive additional pain medication, for the acute post-op pain. However, in this case, you are responsible for picking up a copy of our "Post-op Pain Management for Surgeons" handout, and giving it to your surgeon or dentist. This document is available at our office, and does not require an appointment to obtain it. Simply go to our office during business hours (Monday-Thursday from 8:00 AM to 4:00 PM) (Friday 8:00 AM to 12:00 Noon) or if you have a scheduled appointment with us, prior to your surgery, and ask for it by name. In addition, you are responsible for: calling our office [phone], at any time of the day or night, and leaving a message stating your name, name of your surgeon, type of surgery, and date of procedure or surgery. Failure to comply with your responsibilities may result in termination of therapy involving the controlled substances. Medication Agreement Violation. Following the above rules, including your responsibilities will help you in avoiding a Medication Agreement Violation ("Breaking your Pain Medication Contract").  *Opioid medications include: morphine,  codeine, oxycodone, oxymorphone, hydrocodone, hydromorphone, meperidine, tramadol, tapentadol, buprenorphine, fentanyl, methadone. **Benzodiazepine medications include: diazepam (Valium), alprazolam (Xanax), clonazepam (Klonopine), lorazepam (Ativan), clorazepate (Tranxene), chlordiazepoxide (Librium), estazolam (Prosom), oxazepam (Serax), temazepam (Restoril), triazolam (Halcion) (Last updated: 12/08/2020) ____________________________________________________________________________________________  ____________________________________________________________________________________________  Medication Recommendations and Reminders  Applies to: All patients receiving prescriptions (written and/or electronic).  Medication Rules & Regulations: These rules and regulations exist for your safety and that of others. They are not flexible and neither are we. Dismissing or ignoring them will be considered "non-compliance" with medication therapy, resulting in complete and irreversible termination of such therapy. (See document titled "Medication Rules" for more details.) In all conscience, because of safety reasons, we cannot continue providing a therapy where the patient does not follow instructions.  Pharmacy of record:  Definition: This is the pharmacy where your electronic prescriptions will be sent.  We do not endorse any particular pharmacy, however, we have experienced problems with [facility] not securing enough medication supply for the community. We do not restrict you in your choice of pharmacy. However, once we write for your prescriptions, we will NOT be re-sending more prescriptions to fix restricted supply problems created by your pharmacy, or your insurance.  The pharmacy listed in the electronic medical record should be the one where you want electronic prescriptions to be sent. If you choose to change pharmacy, simply notify our nursing staff.  Recommendations: Keep all of your pain  medications in a safe place, under lock and key, even if you live alone. We will NOT replace lost, stolen, or damaged medication. After you fill your prescription, take 1 week's worth of pills and put them away in a safe place. You should keep a separate, properly labeled bottle for this purpose. The remainder should be kept in the original bottle. Use this as your primary supply, until   it runs out. Once it's gone, then you know that you have 1 week's worth of medicine, and it is time to come in for a prescription refill. If you do this correctly, it is unlikely that you will ever run out of medicine. To make sure that the above recommendation works, it is very important that you make sure your medication refill appointments are scheduled at least 1 week before you run out of medicine. To do this in an effective manner, make sure that you do not leave the office without scheduling your next medication management appointment. Always ask the nursing staff to show you in your prescription , when your medication will be running out. Then arrange for the receptionist to get you a return appointment, at least 7 days before you run out of medicine. Do not wait until you have 1 or 2 pills left, to come in. This is very poor planning and does not take into consideration that we may need to cancel appointments due to bad weather, sickness, or emergencies affecting our staff. DO NOT ACCEPT A "Partial Fill": If for any reason your pharmacy does not have enough pills/tablets to completely fill or refill your prescription, do not allow for a "partial fill". The law allows the pharmacy to complete that prescription within 72 hours, without requiring a new prescription. If they do not fill the rest of your prescription within those 72 hours, you will need a separate prescription to fill the remaining amount, which we will NOT provide. If the reason for the partial fill is your insurance, you will need to talk to the pharmacist  about payment alternatives for the remaining tablets, but again, DO NOT ACCEPT A PARTIAL FILL, unless you can trust your pharmacist to obtain the remainder of the pills within 72 hours.  Prescription refills and/or changes in medication(s):  Prescription refills, and/or changes in dose or medication, will be conducted only during scheduled medication management appointments. (Applies to both, written and electronic prescriptions.) No refills on procedure days. No medication will be changed or started on procedure days. No changes, adjustments, and/or refills will be conducted on a procedure day. Doing so will interfere with the diagnostic portion of the procedure. No phone refills. No medications will be "called into the pharmacy". No Fax refills. No weekend refills. No Holliday refills. No after hours refills.  Remember:  Business hours are:  Monday to Thursday 8:00 AM to 4:00 PM Provider's Schedule: Lenon Kuennen, MD - Appointments are:  Medication management: Monday and Wednesday 8:00 AM to 4:00 PM Procedure day: Tuesday and Thursday 7:30 AM to 4:00 PM Bilal Lateef, MD - Appointments are:  Medication management: Tuesday and Thursday 8:00 AM to 4:00 PM Procedure day: Monday and Wednesday 7:30 AM to 4:00 PM (Last update: 10/01/2019) ____________________________________________________________________________________________  ____________________________________________________________________________________________  CBD (cannabidiol) & Delta-8 (Delta-8 tetrahydrocannabinol) WARNING  Intro: Cannabidiol (CBD) and tetrahydrocannabinol (THC), are two natural compounds found in plants of the Cannabis genus. They can both be extracted from hemp or cannabis. Hemp and cannabis come from the Cannabis sativa plant. Both compounds interact with your body's endocannabinoid system, but they have very different effects. CBD does not produce the high sensation associated with cannabis. Delta-8  tetrahydrocannabinol, also known as delta-8 THC, is a psychoactive substance found in the Cannabis sativa plant, of which marijuana and hemp are two varieties. THC is responsible for the high associated with the illicit use of marijuana.  Applicable to: All individuals currently taking or considering taking CBD (cannabidiol) and,   more important, all patients taking opioid analgesic controlled substances (pain medication). (Example: oxycodone; oxymorphone; hydrocodone; hydromorphone; morphine; methadone; tramadol; tapentadol; fentanyl; buprenorphine; butorphanol; dextromethorphan; meperidine; codeine; etc.)  Legal status: CBD remains a Schedule I drug prohibited for any use. CBD is illegal with one exception. In the [place], CBD has a limited Food and Drug Administration (FDA) approval for the treatment of two specific types of epilepsy disorders. Only one CBD product has been approved by the FDA for this purpose: "Epidiolex". FDA is aware that some companies are marketing products containing cannabis and cannabis-derived compounds in ways that violate the Federal Food, Drug and Cosmetic Act (FD&C Act) and that may put the health and safety of consumers at risk. The FDA, a Federal agency, has not enforced the CBD status since 2018. UPDATE: (04/29/2021) The Drug Enforcement Agency (DEA) issued a letter stating that "delta" cannabinoids, including Delta-8-THCO and Delta-9-THCO, synthetically derived from hemp do not qualify as hemp and will be viewed as Schedule I drugs. (Schedule I drugs, substances, or chemicals are defined as drugs with no currently accepted medical use and a high potential for abuse. Some examples of Schedule I drugs are: heroin, lysergic acid diethylamide (LSD), marijuana (cannabis), 3,4-methylenedioxymethamphetamine (ecstasy), methaqualone, and peyote.) (https://www.dea.gov)  Legality: Some manufacturers ship CBD products nationally, which is illegal. Often such products are sold  online and are therefore available throughout the country. CBD is openly sold in head shops and health food stores in some states where such sales have not been explicitly legalized. Selling unapproved products with unsubstantiated therapeutic claims is not only a violation of the law, but also can put patients at risk, as these products have not been proven to be safe or effective. Federal illegality makes it difficult to conduct research on CBD.  Reference: "FDA Regulation of Cannabis and Cannabis-Derived Products, Including Cannabidiol (CBD)" - https://www.fda.gov/news-events/public-health-focus/fda-regulation-cannabis-and-cannabis-derived-products-including-cannabidiol-cbd  Warning: CBD is not FDA approved and has not undergo the same manufacturing controls as prescription drugs.  This means that the purity and safety of available CBD may be questionable. Most of the time, despite manufacturer's claims, it is contaminated with THC (delta-9-tetrahydrocannabinol - the chemical in marijuana responsible for the "HIGH").  When this is the case, the THC contaminant will trigger a positive urine drug screen (UDS) test for Marijuana (carboxy-THC). Because a positive UDS for any illicit substance is a violation of our medication agreement, your opioid analgesics (pain medicine) may be permanently discontinued. The FDA recently put out a warning about 5 things that everyone should be aware of regarding Delta-8 THC: Delta-8 THC products have not been evaluated or approved by the FDA for safe use and may be marketed in ways that put the public health at risk. The FDA has received adverse event reports involving delta-8 THC-containing products. Delta-8 THC has psychoactive and intoxicating effects. Delta-8 THC manufacturing often involve use of potentially harmful chemicals to create the concentrations of delta-8 THC claimed in the marketplace. The final delta-8 THC product may have potentially harmful by-products  (contaminants) due to the chemicals used in the process. Manufacturing of delta-8 THC products may occur in uncontrolled or unsanitary settings, which may lead to the presence of unsafe contaminants or other potentially harmful substances. Delta-8 THC products should be kept out of the reach of children and pets.  MORE ABOUT CBD  General Information: CBD was discovered in 1940 and it is a derivative of the cannabis sativa genus plants (Marijuana and Hemp). It is one of the 113 identified substances found in Marijuana.   It accounts for up to 40% of the plant's extract. As of 2018, preliminary clinical studies on CBD included research for the treatment of anxiety, movement disorders, and pain. CBD is available and consumed in multiple forms, including inhalation of smoke or vapor, as an aerosol spray, and by mouth. It may be supplied as an oil containing CBD, capsules, dried cannabis, or as a liquid solution. CBD is thought not to be as psychoactive as THC (delta-9-tetrahydrocannabinol - the chemical in marijuana responsible for the "HIGH"). Studies suggest that CBD may interact with different biological target receptors in the body, including cannabinoid and other neurotransmitter receptors. As of 2018 the mechanism of action for its biological effects has not been determined.  Side-effects  Adverse reactions: Dry mouth, diarrhea, decreased appetite, fatigue, drowsiness, malaise, weakness, sleep disturbances, and others.  Drug interactions: CBC may interact with other medications such as blood-thinners. Because CBD causes drowsiness on its own, it also increases the drowsiness caused by other medications, including antihistamines (such as Benadryl), benzodiazepines (Xanax, Ativan, Valium), antipsychotics, antidepressants and opioids, as well as alcohol and supplements such as kava, melatonin and St. John's Wort. Be cautious with the following combinations:   Brivaracetam (Briviact) Brivaracetam is changed  and broken down by the body. CBD might decrease how quickly the body breaks down brivaracetam. This might increase levels of brivaracetam in the body.  Caffeine Caffeine is changed and broken down by the body. CBD might decrease how quickly the body breaks down caffeine. This might increase levels of caffeine in the body.  Carbamazepine (Tegretol) Carbamazepine is changed and broken down by the body. CBD might decrease how quickly the body breaks down carbamazepine. This might increase levels of carbamazepine in the body and increase its side effects.  Citalopram (Celexa) Citalopram is changed and broken down by the body. CBD might decrease how quickly the body breaks down citalopram. This might increase levels of citalopram in the body and increase its side effects.  Clobazam (Onfi) Clobazam is changed and broken down by the liver. CBD might decrease how quickly the liver breaks down clobazam. This might increase the effects and side effects of clobazam.  Eslicarbazepine (Aptiom) Eslicarbazepine is changed and broken down by the body. CBD might decrease how quickly the body breaks down eslicarbazepine. This might increase levels of eslicarbazepine in the body by a small amount.  Everolimus (Zostress) Everolimus is changed and broken down by the body. CBD might decrease how quickly the body breaks down everolimus. This might increase levels of everolimus in the body.  Lithium Taking higher doses of CBD might increase levels of lithium. This can increase the risk of lithium toxicity.  Medications changed by the liver (Cytochrome P450 1A1 (CYP1A1) substrates) Some medications are changed and broken down by the liver. CBD might change how quickly the liver breaks down these medications. This could change the effects and side effects of these medications.  Medications changed by the liver (Cytochrome P450 1A2 (CYP1A2) substrates) Some medications are changed and broken down by the liver. CBD  might change how quickly the liver breaks down these medications. This could change the effects and side effects of these medications.  Medications changed by the liver (Cytochrome P450 1B1 (CYP1B1) substrates) Some medications are changed and broken down by the liver. CBD might change how quickly the liver breaks down these medications. This could change the effects and side effects of these medications.  Medications changed by the liver (Cytochrome P450 2A6 (CYP2A6) substrates) Some medications are changed and   broken down by the liver. CBD might change how quickly the liver breaks down these medications. This could change the effects and side effects of these medications.  Medications changed by the liver (Cytochrome P450 2B6 (CYP2B6) substrates) Some medications are changed and broken down by the liver. CBD might change how quickly the liver breaks down these medications. This could change the effects and side effects of these medications.  Medications changed by the liver (Cytochrome P450 2C19 (CYP2C19) substrates) Some medications are changed and broken down by the liver. CBD might change how quickly the liver breaks down these medications. This could change the effects and side effects of these medications.  Medications changed by the liver (Cytochrome P450 2C8 (CYP2C8) substrates) Some medications are changed and broken down by the liver. CBD might change how quickly the liver breaks down these medications. This could change the effects and side effects of these medications.  Medications changed by the liver (Cytochrome P450 2C9 (CYP2C9) substrates) Some medications are changed and broken down by the liver. CBD might change how quickly the liver breaks down these medications. This could change the effects and side effects of these medications.  Medications changed by the liver (Cytochrome P450 2D6 (CYP2D6) substrates) Some medications are changed and broken down by the liver. CBD might  change how quickly the liver breaks down these medications. This could change the effects and side effects of these medications.  Medications changed by the liver (Cytochrome P450 2E1 (CYP2E1) substrates) Some medications are changed and broken down by the liver. CBD might change how quickly the liver breaks down these medications. This could change the effects and side effects of these medications.  Medications changed by the liver (Cytochrome P450 3A4 (CYP3A4) substrates) Some medications are changed and broken down by the liver. CBD might change how quickly the liver breaks down these medications. This could change the effects and side effects of these medications.  Medications changed by the liver (Glucuronidated drugs) Some medications are changed and broken down by the liver. CBD might change how quickly the liver breaks down these medications. This could change the effects and side effects of these medications.  Medications that decrease the breakdown of other medications by the liver (Cytochrome P450 2C19 (CYP2C19) inhibitors) CBD is changed and broken down by the liver. Some drugs decrease how quickly the liver changes and breaks down CBD. This could change the effects and side effects of CBD.  Medications that decrease the breakdown of other medications in the liver (Cytochrome P450 3A4 (CYP3A4) inhibitors) CBD is changed and broken down by the liver. Some drugs decrease how quickly the liver changes and breaks down CBD. This could change the effects and side effects of CBD.  Medications that increase breakdown of other medications by the liver (Cytochrome P450 3A4 (CYP3A4) inducers) CBD is changed and broken down by the liver. Some drugs increase how quickly the liver changes and breaks down CBD. This could change the effects and side effects of CBD.  Medications that increase the breakdown of other medications by the liver (Cytochrome P450 2C19 (CYP2C19) inducers) CBD is changed and  broken down by the liver. Some drugs increase how quickly the liver changes and breaks down CBD. This could change the effects and side effects of CBD.  Methadone (Dolophine) Methadone is broken down by the liver. CBD might decrease how quickly the liver breaks down methadone. Taking cannabidiol along with methadone might increase the effects and side effects of methadone.  Rufinamide (Banzel) Rufinamide is   changed and broken down by the body. CBD might decrease how quickly the body breaks down rufinamide. This might increase levels of rufinamide in the body by a small amount.  Sedative medications (CNS depressants) CBD might cause sleepiness and slowed breathing. Some medications, called sedatives, can also cause sleepiness and slowed breathing. Taking CBD with sedative medications might cause breathing problems and/or too much sleepiness.  Sirolimus (Rapamune) Sirolimus is changed and broken down by the body. CBD might decrease how quickly the body breaks down sirolimus. This might increase levels of sirolimus in the body.  Stiripentol (Diacomit) Stiripentol is changed and broken down by the body. CBD might decrease how quickly the body breaks down stiripentol. This might increase levels of stiripentol in the body and increase its side effects.  Tacrolimus (Prograf) Tacrolimus is changed and broken down by the body. CBD might decrease how quickly the body breaks down tacrolimus. This might increase levels of tacrolimus in the body.  Tamoxifen (Soltamox) Tamoxifen is changed and broken down by the body. CBD might affect how quickly the body breaks down tamoxifen. This might affect levels of tamoxifen in the body.  Topiramate (Topamax) Topiramate is changed and broken down by the body. CBD might decrease how quickly the body breaks down topiramate. This might increase levels of topiramate in the body by a small amount.  Valproate Valproic acid can cause liver injury. Taking cannabidiol  with valproic acid might increase the chance of liver injury. CBD and/or valproic acid might need to be stopped, or the dose might need to be reduced.  Warfarin (Coumadin) CBD might increase levels of warfarin, which can increase the risk for bleeding. CBD and/or warfarin might need to be stopped, or the dose might need to be reduced.  Zonisamide Zonisamide is changed and broken down by the body. CBD might decrease how quickly the body breaks down zonisamide. This might increase levels of zonisamide in the body by a small amount. (Last update: 05/11/2021) ____________________________________________________________________________________________  ____________________________________________________________________________________________  Drug Holidays (Slow)  What is a "Drug Holiday"? Drug Holiday: is the name given to the period of time during which a patient stops taking a medication(s) for the purpose of eliminating tolerance to the drug.  Benefits Improved effectiveness of opioids. Decreased opioid dose needed to achieve benefits. Improved pain with lesser dose.  What is tolerance? Tolerance: is the progressive decreased in effectiveness of a drug due to its repetitive use. With repetitive use, the body gets use to the medication and as a consequence, it loses its effectiveness. This is a common problem seen with opioid pain medications. As a result, a larger dose of the drug is needed to achieve the same effect that used to be obtained with a smaller dose.  How long should a "Drug Holiday" last? You should stay off of the pain medicine for at least 14 consecutive days. (2 weeks)  Should I stop the medicine "cold turkey"? No. You should always coordinate with your Pain Specialist so that he/she can provide you with the correct medication dose to make the transition as smoothly as possible.  How do I stop the medicine? Slowly. You will be instructed to decrease the daily amount of  pills that you take by one (1) pill every seven (7) days. This is called a "slow downward taper" of your dose. For example: if you normally take four (4) pills per day, you will be asked to drop this dose to three (3) pills per day for seven (7) days, then to   two (2) pills per day for seven (7) days, then to one (1) per day for seven (7) days, and at the end of those last seven (7) days, this is when the "Drug Holiday" would start.   Will I have withdrawals? By doing a "slow downward taper" like this one, it is unlikely that you will experience any significant withdrawal symptoms. Typically, what triggers withdrawals is the sudden stop of a high dose opioid therapy. Withdrawals can usually be avoided by slowly decreasing the dose over a prolonged period of time. If you do not follow these instructions and decide to stop your medication abruptly, withdrawals may be possible.  What are withdrawals? Withdrawals: refers to the wide range of symptoms that occur after stopping or dramatically reducing opiate drugs after heavy and prolonged use. Withdrawal symptoms do not occur to patients that use low dose opioids, or those who take the medication sporadically. Contrary to benzodiazepine (example: Valium, Xanax, etc.) or alcohol withdrawals ("Delirium Tremens"), opioid withdrawals are not lethal. Withdrawals are the physical manifestation of the body getting rid of the excess receptors.  Expected Symptoms Early symptoms of withdrawal may include: Agitation Anxiety Muscle aches Increased tearing Insomnia Runny nose Sweating Yawning  Late symptoms of withdrawal may include: Abdominal cramping Diarrhea Dilated pupils Goose bumps Nausea Vomiting  Will I experience withdrawals? Due to the slow nature of the taper, it is very unlikely that you will experience any.  What is a slow taper? Taper: refers to the gradual decrease in dose.  (Last update:  10/01/2019) ____________________________________________________________________________________________   Naloxone Nasal Spray What is this medication? NALOXONE (nal OX one) treats opioid overdose, which causes slow or shallow breathing, severe drowsiness, or trouble staying awake. Call emergency services after using this medication. You may need additional treatment. Naloxone works by reversing the effects of opioids. It belongs to a group of medications called opioid blockers. This medicine may be used for other purposes; ask your health care provider or pharmacist if you have questions. COMMON BRAND NAME(S): Kloxxado, Narcan What should I tell my care team before I take this medication? They need to know if you have any of these conditions: Heart disease Substance use disorder An unusual or allergic reaction to naloxone, other medications, foods, dyes, or preservatives Pregnant or trying to get pregnant Breast-feeding How should I use this medication? This medication is for use in the nose. Lay the person on their back. Support their neck with your hand and allow the head to tilt back before giving the medication. The nasal spray should be given into 1 nostril. After giving the medication, move the person onto their side. Do not remove or test the nasal spray until ready to use. Get emergency medical help right away after giving the first dose of this medication, even if the person wakes up. You should be familiar with how to recognize the signs and symptoms of a narcotic overdose. If more doses are needed, give the additional dose in the other nostril. Talk to your care team about the use of this medication in children. While this medication may be prescribed for children as young as newborns for selected conditions, precautions do apply. Overdosage: If you think you have taken too much of this medicine contact a poison control center or emergency room at once. NOTE: This medicine is only  for you. Do not share this medicine with others. What if I miss a dose? This does not apply. What may interact with this medication? This is only used  during an emergency. No interactions are expected during emergency use. This list may not describe all possible interactions. Give your health care provider a list of all the medicines, herbs, non-prescription drugs, or dietary supplements you use. Also tell them if you smoke, drink alcohol, or use illegal drugs. Some items may interact with your medicine. What should I watch for while using this medication? Keep this medication ready for use in the case of an opioid overdose. Make sure that you have the phone number of your care team and local hospital ready. You may need to have additional doses of this medication. Each nasal spray contains a single dose. Some emergencies may require additional doses. After use, bring the treated person to the nearest hospital or call 911. Make sure the treating care team knows that the person has received a dose of this medication. You will receive additional instructions on what to do during and after use of this medication before an emergency occurs. What side effects may I notice from receiving this medication? Side effects that you should report to your care team as soon as possible: Allergic reactions--skin rash, itching, hives, swelling of the face, lips, tongue, or throat Side effects that usually do not require medical attention (report these to your care team if they continue or are bothersome): Constipation Dryness or irritation inside the nose Headache Increase in blood pressure Muscle spasms Stuffy nose Toothache This list may not describe all possible side effects. Call your doctor for medical advice about side effects. You may report side effects to FDA at 1-800-FDA-1088. Where should I keep my medication? Keep out of the reach of children and pets. Store between 20 and 25 degrees C (68 and 77  degrees F). Do not freeze. Throw away any unused medication after the expiration date. Keep in original box until ready to use. NOTE: This sheet is a summary. It may not cover all possible information. If you have questions about this medicine, talk to your doctor, pharmacist, or health care provider.  2023 [employer] Standard (2021-01-24 00:00:00)

## 2021-12-14 NOTE — Progress Notes (Signed)
Nursing Pain Medication Assessment:  Safety precautions to be maintained throughout the outpatient stay will include: orient to surroundings, keep bed in low position, maintain call bell within reach at all times, provide assistance with transfer out of bed and ambulation.  Medication Inspection Compliance: Pill count conducted under aseptic conditions, in front of the patient. Neither the pills nor the bottle was removed from the patient's sight at any time. Once count was completed pills were immediately returned to the patient in their original bottle.  Medication: Morphine IR Pill/Patch Count:  52 of 120 pills remain Pill/Patch Appearance: Markings consistent with prescribed medication Bottle Appearance: Standard pharmacy container. Clearly labeled. Filled Date: 8 / 14 / 2023 Last Medication intake:  TodaySafety precautions to be maintained throughout the outpatient stay will include: orient to surroundings, keep bed in low position, maintain call bell within reach at all times, provide assistance with transfer out of bed and ambulation.

## 2021-12-21 ENCOUNTER — Telehealth: Payer: Self-pay | Admitting: Pain Medicine

## 2021-12-21 NOTE — Telephone Encounter (Signed)
Patient would like to know results of xrays done last week.

## 2021-12-22 NOTE — Telephone Encounter (Signed)
Called patient and she states that someone called her yesterday and told her that it was normal. Offered a VV so Dr Dossie Arbour could review this with her and she states that he has told her that he could not do anything to help her with this issue.

## 2022-01-03 ENCOUNTER — Other Ambulatory Visit: Payer: Self-pay

## 2022-01-03 ENCOUNTER — Ambulatory Visit: Payer: Self-pay | Admitting: *Deleted

## 2022-01-03 ENCOUNTER — Emergency Department: Payer: Medicare Other

## 2022-01-03 ENCOUNTER — Inpatient Hospital Stay
Admission: EM | Admit: 2022-01-03 | Discharge: 2022-01-07 | DRG: 272 | Disposition: A | Discharge status: Home / Self Care | Payer: Medicare Other | Attending: Internal Medicine | Admitting: Internal Medicine

## 2022-01-03 DIAGNOSIS — Z888 Allergy status to other drugs, medicaments and biological substances status: Secondary | ICD-10-CM

## 2022-01-03 DIAGNOSIS — K5909 Other constipation: Secondary | ICD-10-CM

## 2022-01-03 DIAGNOSIS — M792 Neuralgia and neuritis, unspecified: Secondary | ICD-10-CM | POA: Diagnosis not present

## 2022-01-03 DIAGNOSIS — F419 Anxiety disorder, unspecified: Secondary | ICD-10-CM | POA: Diagnosis present

## 2022-01-03 DIAGNOSIS — I724 Aneurysm of artery of lower extremity: Principal | ICD-10-CM | POA: Diagnosis present

## 2022-01-03 DIAGNOSIS — K219 Gastro-esophageal reflux disease without esophagitis: Secondary | ICD-10-CM | POA: Diagnosis not present

## 2022-01-03 DIAGNOSIS — G629 Polyneuropathy, unspecified: Secondary | ICD-10-CM | POA: Diagnosis present

## 2022-01-03 DIAGNOSIS — Z981 Arthrodesis status: Secondary | ICD-10-CM

## 2022-01-03 DIAGNOSIS — Z882 Allergy status to sulfonamides status: Secondary | ICD-10-CM

## 2022-01-03 DIAGNOSIS — J45909 Unspecified asthma, uncomplicated: Secondary | ICD-10-CM | POA: Diagnosis not present

## 2022-01-03 DIAGNOSIS — I480 Paroxysmal atrial fibrillation: Secondary | ICD-10-CM | POA: Diagnosis not present

## 2022-01-03 DIAGNOSIS — I10 Essential (primary) hypertension: Secondary | ICD-10-CM | POA: Diagnosis present

## 2022-01-03 DIAGNOSIS — Z96641 Presence of right artificial hip joint: Secondary | ICD-10-CM

## 2022-01-03 DIAGNOSIS — Z885 Allergy status to narcotic agent status: Secondary | ICD-10-CM | POA: Diagnosis not present

## 2022-01-03 DIAGNOSIS — Z880 Allergy status to penicillin: Secondary | ICD-10-CM

## 2022-01-03 DIAGNOSIS — Z91018 Allergy to other foods: Secondary | ICD-10-CM

## 2022-01-03 DIAGNOSIS — I340 Nonrheumatic mitral (valve) insufficiency: Secondary | ICD-10-CM | POA: Diagnosis present

## 2022-01-03 DIAGNOSIS — Z7901 Long term (current) use of anticoagulants: Secondary | ICD-10-CM

## 2022-01-03 DIAGNOSIS — Z9889 Other specified postprocedural states: Secondary | ICD-10-CM | POA: Diagnosis not present

## 2022-01-03 DIAGNOSIS — Z79899 Other long term (current) drug therapy: Secondary | ICD-10-CM | POA: Diagnosis not present

## 2022-01-03 DIAGNOSIS — Z8616 Personal history of COVID-19: Secondary | ICD-10-CM

## 2022-01-03 DIAGNOSIS — G894 Chronic pain syndrome: Secondary | ICD-10-CM | POA: Diagnosis present

## 2022-01-03 DIAGNOSIS — G4730 Sleep apnea, unspecified: Secondary | ICD-10-CM | POA: Diagnosis present

## 2022-01-03 DIAGNOSIS — Z818 Family history of other mental and behavioral disorders: Secondary | ICD-10-CM

## 2022-01-03 DIAGNOSIS — Z91013 Allergy to seafood: Secondary | ICD-10-CM | POA: Diagnosis not present

## 2022-01-03 DIAGNOSIS — K8689 Other specified diseases of pancreas: Secondary | ICD-10-CM | POA: Diagnosis not present

## 2022-01-03 DIAGNOSIS — T81718A Complication of other artery following a procedure, not elsewhere classified, initial encounter: Secondary | ICD-10-CM | POA: Diagnosis not present

## 2022-01-03 DIAGNOSIS — I4891 Unspecified atrial fibrillation: Secondary | ICD-10-CM | POA: Diagnosis not present

## 2022-01-03 LAB — BASIC METABOLIC PANEL
Anion gap: 10 (ref 5–15)
BUN: 13 mg/dL (ref 8–23)
CO2: 23 mmol/L (ref 22–32)
Calcium: 9.3 mg/dL (ref 8.9–10.3)
Chloride: 104 mmol/L (ref 98–111)
Creatinine, Ser: 0.89 mg/dL (ref 0.44–1.00)
GFR, Estimated: >60 mL/min (ref >60)
Glucose, Bld: 122 mg/dL — ABNORMAL HIGH (ref 70–99)
Potassium: 4.2 mmol/L (ref 3.5–5.1)
Sodium: 137 mmol/L (ref 135–145)

## 2022-01-03 LAB — CBC WITH DIFFERENTIAL/PLATELET
Abs Immature Granulocytes: 0.02 10*3/uL (ref 0.00–0.07)
Basophils Absolute: 0.1 10*3/uL (ref 0.0–0.1)
Basophils Relative: 1 %
Eosinophils Absolute: 0.1 10*3/uL (ref 0.0–0.5)
Eosinophils Relative: 2 %
HCT: 41.8 % (ref 36.0–46.0)
Hemoglobin: 14 g/dL (ref 12.0–15.0)
Immature Granulocytes: 0 %
Lymphocytes Relative: 38 %
Lymphs Abs: 3 10*3/uL (ref 0.7–4.0)
MCH: 29.3 pg (ref 26.0–34.0)
MCHC: 33.5 g/dL (ref 30.0–36.0)
MCV: 87.4 fL (ref 80.0–100.0)
Monocytes Absolute: 0.4 10*3/uL (ref 0.1–1.0)
Monocytes Relative: 5 %
Neutro Abs: 4.3 10*3/uL (ref 1.7–7.7)
Neutrophils Relative %: 54 %
Platelets: 350 10*3/uL (ref 150–400)
RBC: 4.78 MIL/uL (ref 3.87–5.11)
RDW: 12.5 % (ref 11.5–15.5)
WBC: 7.8 10*3/uL (ref 4.0–10.5)
nRBC: 0 % (ref 0.0–0.2)

## 2022-01-03 LAB — PROTIME-INR
INR: 1.2 (ref 0.8–1.2)
Prothrombin Time: 14.8 seconds (ref 11.4–15.2)

## 2022-01-03 MED ORDER — MORPHINE SULFATE 15 MG PO TABS
15.0000 mg | ORAL_TABLET | Freq: Four times a day (QID) | ORAL | Status: DC | PRN
  Administered 2022-01-04 – 2022-01-07 (×11): 15 mg via ORAL
  Filled 2022-01-03 (×11): qty 1

## 2022-01-03 MED ORDER — APIXABAN 5 MG PO TABS
5.0000 mg | ORAL_TABLET | Freq: Two times a day (BID) | ORAL | Status: DC
  Filled 2022-01-03: qty 1

## 2022-01-03 MED ORDER — ALPRAZOLAM 0.5 MG PO TABS
1.0000 mg | ORAL_TABLET | Freq: Every day | ORAL | Status: DC
  Administered 2022-01-03 – 2022-01-06 (×4): 1 mg via ORAL
  Filled 2022-01-03 (×4): qty 2

## 2022-01-03 MED ORDER — BUSPIRONE HCL 10 MG PO TABS
15.0000 mg | ORAL_TABLET | Freq: Two times a day (BID) | ORAL | Status: DC
  Administered 2022-01-03 – 2022-01-07 (×8): 15 mg via ORAL
  Filled 2022-01-03 (×8): qty 2

## 2022-01-03 MED ORDER — ACETAMINOPHEN 325 MG PO TABS
650.0000 mg | ORAL_TABLET | Freq: Four times a day (QID) | ORAL | Status: DC | PRN
  Filled 2022-01-03: qty 2

## 2022-01-03 MED ORDER — ONDANSETRON HCL 4 MG PO TABS
4.0000 mg | ORAL_TABLET | Freq: Four times a day (QID) | ORAL | Status: DC | PRN
  Administered 2022-01-05: 4 mg via ORAL
  Filled 2022-01-03: qty 1

## 2022-01-03 MED ORDER — ACETAMINOPHEN 650 MG RE SUPP: 650.0000 mg | Freq: Four times a day (QID) | RECTAL | Status: DC | PRN

## 2022-01-03 MED ORDER — LACTATED RINGERS IV SOLN: INTRAVENOUS | Status: DC

## 2022-01-03 MED ORDER — ONDANSETRON HCL 4 MG/2ML IJ SOLN: 4.0000 mg | Freq: Four times a day (QID) | INTRAMUSCULAR | Status: DC | PRN

## 2022-01-03 MED ORDER — AMIODARONE HCL 200 MG PO TABS
200.0000 mg | ORAL_TABLET | Freq: Every day | ORAL | Status: DC
  Administered 2022-01-04 – 2022-01-06 (×3): 200 mg via ORAL
  Filled 2022-01-03 (×4): qty 1

## 2022-01-03 MED ORDER — DILTIAZEM HCL ER COATED BEADS 120 MG PO CP24
120.0000 mg | ORAL_CAPSULE | Freq: Every day | ORAL | Status: DC
  Administered 2022-01-04 – 2022-01-07 (×4): 120 mg via ORAL
  Filled 2022-01-03 (×4): qty 1

## 2022-01-03 MED ORDER — PANTOPRAZOLE SODIUM 40 MG PO TBEC
40.0000 mg | DELAYED_RELEASE_TABLET | Freq: Every day | ORAL | Status: DC
  Administered 2022-01-04 – 2022-01-07 (×4): 40 mg via ORAL
  Filled 2022-01-03 (×5): qty 1

## 2022-01-03 NOTE — H&P (Signed)
History and Physical    Patient: [name] DXA:[id number] DOB: Jul 12, 1960 DOA: 01/03/2022 DOS: the patient was seen and examined on 01/03/2022 PCP: Virginia Crews, MD  Patient coming from: Home  Chief Complaint:  Chief Complaint  Patient presents with   Abscess   HPI: [name] is a 61 y.o. female with medical history significant of atrial fibrillation, chronic pain syndrome asthma, CO2 disc disease, multiple joint and musculoskeletal problems who apparently underwent ablation of atrial fibrillation about 3 months ago with left groin approach.  Since then she has noted spots in the area which got worse this Friday.  Pain and swelling.  No redness no discharge.  Patient noted the pain to be initially with activity or movement but lately even at rest.  The area has gotten worse swollen that she decided to come to the ER for evaluation.  Patient was concerned about this being a clot.  In the ER however evaluation showed a pseudoaneurysm on the side of her catheterization.  Vascular surgery consulted and recommends admission to the medical service and they will follow.  Patient has no bleeding episodes.  She is still on Eliquis.  Hemodynamically stable.  Heart rate controlled.  She is being admitted to the medical service therefore for evaluation and treatment by vascular surgery.  Review of Systems: As mentioned in the history of present illness. All other systems reviewed and are negative. 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    Chronic headaches    Chronic, continuous use of opioids    DDD  (degenerative disc disease)    CERVICAL AND LUMBAR-CHRONIC PAIN, RT HIP LABRAL TEAR   Depression    PT STATES A LOT OF STRESS IN HER LIFE   Dissociative disorder    Dizziness 04/22/2013   Duodenal ulcer with hemorrhage and perforation ([place]) 04/27/2003   Foot drop, right    FROM BACK SURGERY   GERD (gastroesophageal reflux disease)    H/O arthrodesis 08/18/2014   Headache(784.0)    AND NECK PAIN--STATES RECENT TEST SHOW CERVICAL DEGENERATION   History of blood transfusion    s/p back surgery   History of cardiac cath    a. 05/2019 Cath: Nl cors. EF 55-65%.   History of cardioversion    History of cervical spinal surgery 01/04/2015   History of kidney stones    Hypertension    Inverted T wave    Iron deficiency 02/01/2021   Mitral regurgitation    a. 07/2020 Echo: EF 60-65%, no rwma. Nl RV size/fxn. RVSP 39.58mHg. Mildly to mod dil LA. Mod MR; b. 07/2020 TEE: EF 55-60%. Lambl's excresence. Nl RV size/fxn. Midly dil LA. No LA/LAA thrombus. Mild MR.   Narrowing of intervertebral disc space 08/18/2014   Currently on disability.    Orthostatic hypotension 04/22/2013   Pain    CHRONIC NECK AND BACK PAIN - LIMITED ROM NECK - S/P FUSIONS CERVICAL AND LUMBAR   Paroxysmal atrial fibrillation ([place] 2021   a. 07/2020 s/p PVI.   Pneumonia    PONV (postoperative nausea  and vomiting)    PT GIVES HX OF N&V AND FEVER WITH SURGERIES YEARS AGO--BUT NO PROBLEMS WITH MORE RECENT SURGERIES--STATES NOT MALIGNANT HYPERTHERMIA   Postop Hyponatremia 05/14/2012   Postoperative anemia due to acute blood loss 05/14/2012   PTSD (post-traumatic stress disorder)    Right foot drop    Right hip arthralgia 08/18/2014   Status post surgery of right, and now needs left.    Sleep apnea 2021   does not have a cpap   Therapeutic opioid-induced constipation (OIC)    Typical atrial flutter [facility])    Past Surgical History:  Procedure Laterality Date   ABDOMINAL HYSTERECTOMY     afib  05/2019   ANTERIOR CERVICAL  DECOMP/DISCECTOMY FUSION N/A 10/30/2012   Procedure: ACDF C5-6, EXPLORATION AND HARDWARE REMOVAL C6-7;  Surgeon: Melina Schools, MD;  Location: [place];  Service: Orthopedics;  Laterality: N/A;   ANTERIOR FUSION CERVICAL SPINE  MAY 2012   AT [place] N/A 08/06/2020   Procedure: ATRIAL FIBRILLATION ABLATION;  Surgeon: Vickie Epley, MD;  Location: [place] [facility];  Service: Cardiovascular;  Laterality: N/A;   BACK SURGERY  2009   LUMBAR FUSION WITH RODS    BREAST BIOPSY Right 2008   benign.- bx/clip   BREAST EXCISIONAL BIOPSY Left 1998   benign   BREAST REDUCTION SURGERY Bilateral 06/2016   CARDIAC ELECTROPHYSIOLOGY MAPPING AND ABLATION     CARDIOVERSION N/A 12/03/2019   Procedure: CARDIOVERSION;  Surgeon: Kate Sable, MD;  Location: [place] [facility];  Service: Cardiovascular;  Laterality: N/A;   CARDIOVERSION N/A 08/01/2021   Procedure: CARDIOVERSION;  Surgeon: Kate Sable, MD;  Location: [facility];  Service: Cardiovascular;  Laterality: N/A;   CARDIOVERSION N/A 08/05/2021   Procedure: CARDIOVERSION;  Surgeon: Minna Merritts, MD;  Location: [facility];  Service: Cardiovascular;  Laterality: N/A;   CARPAL TUNNEL RELEASE  05-06-12   Right   CHOLECYSTECTOMY     COLONOSCOPY WITH PROPOFOL N/A 01/26/2017   Procedure: COLONOSCOPY WITH PROPOFOL;  Surgeon: [place] Lame, MD;  Location: [place];  Service: Endoscopy;  Laterality: N/A;   COLONOSCOPY WITH PROPOFOL N/A 01/16/2021   Procedure: COLONOSCOPY WITH PROPOFOL;  Surgeon: Lin Landsman, MD;  Location: [facility] ENDOSCOPY;  Service: Gastroenterology;  Laterality: N/A;   DIAGNOSTIC LAPAROSCOPIES - MULTIPLE FOR ENDOMETRIOSIS     ESOPHAGEAL DILATION N/A 01/26/2017   Procedure: ESOPHAGEAL DILATION;  Surgeon: [place] Lame, MD;  Location: [place];  Service: Endoscopy;  Laterality: N/A;   ESOPHAGEAL MANOMETRY N/A 05/30/2017   Procedure: ESOPHAGEAL MANOMETRY (EM);  Surgeon: [place] Lame, MD;   Location: [facility] ENDOSCOPY;  Service: Endoscopy;  Laterality: N/A;   ESOPHAGOGASTRODUODENOSCOPY (EGD) WITH PROPOFOL N/A 01/26/2017   Procedure: ESOPHAGOGASTRODUODENOSCOPY (EGD) WITH PROPOFOL;  Surgeon: [place] Lame, MD;  Location: [place];  Service: Endoscopy;  Laterality: N/A;   ESOPHAGOGASTRODUODENOSCOPY (EGD) WITH PROPOFOL N/A 01/30/2020   Procedure: ESOPHAGOGASTRODUODENOSCOPY (EGD) WITH PROPOFOL;  Surgeon: [place] Lame, MD;  Location: [place];  Service: Endoscopy;  Laterality: N/A;  sleep apnea COVID + 01-15-20   ESOPHAGOGASTRODUODENOSCOPY (EGD) WITH PROPOFOL N/A 01/15/2021   Procedure: ESOPHAGOGASTRODUODENOSCOPY (EGD) WITH PROPOFOL;  Surgeon: Lin Landsman, MD;  Location: [place];  Service: Gastroenterology;  Laterality: N/A;   ESOPHAGOGASTRODUODENOSCOPY (EGD) WITH PROPOFOL N/A 12/07/2021   Procedure: ESOPHAGOGASTRODUODENOSCOPY (EGD) WITH PROPOFOL;  Surgeon: Lin Landsman, MD;  Location: [facility] ENDOSCOPY;  Service: Gastroenterology;  Laterality: N/A;   HIP ARTHROSCOPY  09/20/2011   Procedure: ARTHROSCOPY HIP;  Surgeon: Dione Plover  Aluisio, MD;  Location: [facility];  Service: Orthopedics;  Laterality: Right;  Right Hip Scope with Labral Debridement   LEFT HEART CATH AND CORONARY ANGIOGRAPHY Left 06/10/2019   Procedure: LEFT HEART CATH AND CORONARY ANGIOGRAPHY;  Surgeon: Nelva Bush, MD;  Location: [place] [facility];  Service: Cardiovascular;  Laterality: Left;   NASAL SEPTUM SURGERY  MARCH 2013   IN [place]   [facility] IMPEDANCE STUDY N/A 05/30/2017   Procedure: [place] IMPEDANCE STUDY;  Surgeon: [place] Lame, MD;  Location: [facility] ENDOSCOPY;  Service: Endoscopy;  Laterality: N/A;   POLYPECTOMY N/A 01/26/2017   Procedure: POLYPECTOMY;  Surgeon: [place] Lame, MD;  Location: [place];  Service: Endoscopy;  Laterality: N/A;   POSTERIOR CERVICAL FUSION/FORAMINOTOMY N/A 04/16/2013   Procedure: REMOVAL CERVICAL PLATES AND INTERBODY CAGE/POSTERIOR CERVICAL SPINAL  FUSION C4 - C6/C5 CORPECTOMY/C4 - C6 FUSION WITH ILIAC CREST BONE GRAFT;  Surgeon: Melina Schools, MD;  Location: [place];  Service: Orthopedics;  Laterality: N/A;   RADIOFREQUENCY ABLATION NERVES     REDUCTION MAMMAPLASTY Bilateral 05/2016   RIGHT HIP ARTHROSCOPY FOR LABRAL TEAR  ABOUT 2010   2012 also   SHOULDER ARTHROSCOPY  05-06-12   bone spur   TEE WITHOUT CARDIOVERSION N/A 08/05/2020   Procedure: TRANSESOPHAGEAL ECHOCARDIOGRAM (TEE);  Surgeon: Lelon Perla, MD;  Location: [facility] ENDOSCOPY;  Service: Cardiovascular;  Laterality: N/A;   TONSILLECTOMY     AS A CHILD   TOTAL HIP ARTHROPLASTY Right 05/13/2012   Procedure: TOTAL HIP ARTHROPLASTY ANTERIOR APPROACH;  Surgeon: Gearlean Alf, MD;  Location: [facility];  Service: Orthopedics;  Laterality: Right;   Social History:  reports that she has never smoked. She has never used smokeless tobacco. She reports current drug use. She reports that she does not drink alcohol.  Allergies  Allergen Reactions   Amoxicillin Hives    She did ok w ANCEF   Chlorhexidine Gluconate Dermatitis and Hives   Clindamycin Hives   Codeine Hives   Erythromycin Hives    "mycins" in general   Penicillin G Hives    "cillins" in general   Sulfa Antibiotics Nausea And Vomiting and Hives        Levofloxacin Hives   Shellfish Allergy Hives   Decadron [Dexamethasone] Other (See Comments)    Hot flashes, insomnia, "manic" Hot flashes, insomnia, "manic"   Mangifera Indica Hives    papaya   Papaya Derivatives Hives   Betadine [Povidone Iodine] Hives        Clarithromycin Hives   Other Hives     Mango    Povidone-Iodine Hives   Prednisone Anxiety    High blood pressure, flushed, mood changes, heart palpitations High blood pressure, flushed, mood changes, heart palpitations    Family History  Problem Relation Age of Onset   Aneurysm Mother    Bipolar disorder Sister    Aneurysm Maternal Grandmother    Breast cancer Paternal Grandmother 66   Bipolar  disorder Grandchild    Anxiety disorder Grandchild    Depression Grandchild    Cancer Neg Hx     Prior to Admission medications   Medication Sig Start Date End Date Taking? Authorizing Provider  ALPRAZolam Duanne Moron) 1 MG tablet Take 1 mg by mouth at bedtime.  07/04/19  Yes [provider]  apixaban (ELIQUIS) 5 MG TABS tablet Take 1 tablet (5 mg total) by mouth 2 (two) times daily. 10/24/21  Yes Agbor-Etang, Aaron Edelman, MD  busPIRone (BUSPAR) 15 MG tablet Take 15 mg by mouth 2 (two)  times daily. 07/20/21  Yes [provider]  CREON 36000-114000 units CPEP capsule TAKE 2 CAPSULES WITH THE FIRST BITE OF [facility] MEAL AND 1 CAPSULE WITH THE FIRST BITE OF EACH SNACK 12/05/21  Yes Vanga, Tally Due, MD  diltiazem (CARDIZEM CD) 120 MG 24 hr capsule Take 1 capsule (120 mg total) by mouth daily. 06/27/21  Yes Kate Sable, MD  linaclotide Rolan Lipa) 290 MCG CAPS capsule Take 1 capsule (290 mcg total) by mouth daily before breakfast. 11/16/21  Yes Vanga, Tally Due, MD  montelukast (SINGULAIR) 10 MG tablet TAKE 1 TABLET BY MOUTH EVERY DAY 08/30/21  Yes Bacigalupo, Dionne Bucy, MD  omeprazole (PRILOSEC) 40 MG capsule TAKE 1 CAPSULE (40 MG TOTAL) BY MOUTH 2 (TWO) TIMES DAILY BEFORE A MEAL. 12/05/21  Yes Vanga, Tally Due, MD  valACYclovir (VALTREX) 1000 MG tablet TAKE TWO TABLETS BY MOUTH TWICE DAILY FOR ONE DAY FEVER BLISTER 06/13/21  Yes Bacigalupo, Dionne Bucy, MD  amiodarone (PACERONE) 200 MG tablet Take 1 tablet (200 mg total) by mouth 2 (two) times daily for 14 days. Patient taking differently: Take 200 mg by mouth daily. 08/13/21 11/02/21  Nolberto Hanlon, MD  morphine (MSIR) 15 MG tablet Take 1 tablet (15 mg total) by mouth every 6 (six) hours as needed for moderate pain or severe pain. Must last 30 days. 12/20/21 01/19/22  Milinda Pointer, MD  morphine (MSIR) 15 MG tablet Take 1 tablet (15 mg total) by mouth every 6 (six) hours as needed for moderate pain or severe pain. Must last 30 days. 01/19/22  02/18/22  Milinda Pointer, MD  morphine (MSIR) 15 MG tablet Take 1 tablet (15 mg total) by mouth every 6 (six) hours as needed for moderate pain or severe pain. Must last 30 days. 02/18/22 03/20/22  Milinda Pointer, MD  naloxone [facility]) nasal spray 4 mg/0.1 mL Place 1 spray into the nose as needed for up to 365 doses (for opioid-induced respiratory depresssion). In case of emergency (overdose), spray once into each nostril. If no response within 3 minutes, repeat application and call [phone]. 12/14/21 12/14/22  Milinda Pointer, MD  neomycin-polymyxin b-dexamethasone (MAXITROL) 3.5-10000-0.1 SUSP 1 drop 4 (four) times daily. Patient not taking: Reported on 01/03/2022 11/02/21   [provider]  sodium chloride (OCEAN) 0.65 % SOLN nasal spray Place 1 spray into both nostrils daily as needed for congestion ([place]ry[place]).    [provider]    Physical Exam: Vitals:   01/03/22 1730 01/03/22 2114 01/03/22 2144 01/03/22 2203  BP: (!) 152/92 (!) 147/87  (!) 131/102  Pulse: 63 65  78  Resp: '18 18  17  '$ Temp: 98 F (36.7 C) 98.1 F (36.7 C) 98.1 F (36.7 C) 98.2 F (36.8 C)  TempSrc:  Oral Oral   SpO2: 100% 99%  97%  Weight:    71.5 kg  Height:    '5\' 4"'$  (1.626 m)   Constitutional: NAD, calm, comfortable Eyes: PERRL, lids and conjunctivae normal ENMT: Mucous membranes are moist. Posterior pharynx clear of any exudate or lesions.Normal dentition.  Neck: normal, supple, no masses, no thyromegaly Respiratory: clear to auscultation bilaterally, no wheezing, no crackles. Normal respiratory effort. No accessory muscle use.  Cardiovascular: Regular rate and rhythm, no murmurs / rubs / gallops. No extremity edema. 2+ pedal pulses. No carotid bruits.  Abdomen: no tenderness, no masses palpated. No hepatosplenomegaly. Bowel sounds positive.  Musculoskeletal: Left groin is swollen, tender, good range of motion, no joint swelling or tenderness, Skin: no rashes, lesions, ulcers. No  induration Neurologic: CN 2-12 grossly intact. Sensation intact, DTR normal. Strength 5/5 in all 4.  Psychiatric: Normal judgment and insight. Alert and oriented x 3. Normal mood  Data Reviewed:  Temperature 98.2, blood pressure 130/102, pulse 78, respirate of 17 oxygen sats 97% on room air.  Glucose 122 otherwise CBC and chemistry all within normal.  Assessment and Plan:  #1 left femoral pseudoaneurysm: Following previous cath for ablation.  Patient will be admitted and vascular surgery consulted already.  Pain control and supportive care.  Will defer to vascular surgery regarding next steps of action.  2 chronic pain syndrome: Patient goes to the pain clinic.  She apparently has been on methadone.  Also mask IR here noted.  We will resume home regimen as much as possible.  #3 atrial fibrillation: Status post ablation.  Rate appears controlled.  Continue apixaban as well as home regimen for rate control  #4 asthma: Stable no acute exacerbation.  #5 GERD: Continue PPIs    Advance Care Planning:   Code Status: Prior full code  Consults: Vascular surgery Dr. Delana Meyer  Family Communication: No family at bedside  Severity of Illness: The appropriate patient status for this patient is INPATIENT. Inpatient status is judged to be reasonable and necessary in order to provide the required intensity of service to ensure the patient's safety. The patient's presenting symptoms, physical exam findings, and initial radiographic and laboratory data in the context of their chronic comorbidities is felt to place them at high risk for further clinical deterioration. Furthermore, it is not anticipated that the patient will be medically stable for discharge from the hospital within 2 midnights of admission.   * I certify that at the point of admission it is my clinical judgment that the patient will require inpatient hospital care spanning beyond 2 midnights from the point of admission due to high intensity  of service, high risk for further deterioration and high frequency of surveillance required.*  AuthorBarbette Merino, MD 01/03/2022 10:58 PM  For on call review [email or web address].

## 2022-01-03 NOTE — ED Provider Notes (Signed)
[facility] Provider Note    Event Date/Time   First MD Initiated Contact with Patient 01/03/22 1806     (approximate)   History   Abscess   HPI  [name] is a 61 y.o. female with history of atrial fibrillation on Eliquis with cardiac ablasion on 08/30/21. For the past 3 days, she has had a tender area in the left groin that seems swollen. She is concerned about a blood clot.       Physical Exam   Triage Vital Signs: ED Triage Vitals  Enc Vitals Group     BP 01/03/22 1730 (!) 152/92     Pulse Rate 01/03/22 1730 63     Resp 01/03/22 1730 18     Temp 01/03/22 1730 98 F (36.7 C)     Temp src --      SpO2 01/03/22 1730 100 %     Weight --      Height --      Head Circumference --      Peak Flow --      Pain Score 01/03/22 1729 5     Pain Loc --      Pain Edu? --      Excl. in [place]? --     Most recent vital signs: Vitals:   01/03/22 1730  BP: (!) 152/92  Pulse: 63  Resp: 18  Temp: 98 F (36.7 C)  SpO2: 100%     General: Awake, no distress.  CV:  Good peripheral perfusion. LLE warm with 2+ pedal pulses. Resp:  Normal effort.  Abd:  No distention.  Other:  Left groin tender without erythema, contusion, or area of fluctuance. Area is tender to touch and appears larger in size than on the right.   ED Results / Procedures / Treatments   Labs (all labs ordered are listed, but only abnormal results are displayed) Labs Reviewed  BASIC METABOLIC PANEL  CBC WITH DIFFERENTIAL/PLATELET  PROTIME-INR     EKG  Not indicated.   RADIOLOGY  Pseudoaneurysm left femoral artery   PROCEDURES:  Critical Care performed: No  Procedures   MEDICATIONS ORDERED IN ED: Medications - No data to display   IMPRESSION / MDM / [place] / ED COURSE  I reviewed the triage vital signs and the nursing notes.                              Differential diagnosis includes, but is not limited to, groin strain, abscess,  hidradenitis, pseudoaneurysm  Patient's presentation is most consistent with acute illness / injury with system symptoms.  61 year old female presenting to the emergency department for treatment and evaluation of left groin pain.  See HPI for further details.  On exam, the left groin is tender and does appear slightly larger than the right side.  There is no indication of abscess/cellulitis.  Plan will be to get ultrasound imaging.  Ultrasound shows a pseudoaneurysm.  Plan will be to get some lab studies and consult vascular specialist.  Dr. Delana Meyer with vascular surgery consulted.  Plan will be to admit the patient via medicine service and he will see her in the morning.  Results of the imaging and plan of admission reviewed with the patient and her husband who are agreeable.     FINAL CLINICAL IMPRESSION(S) / ED DIAGNOSES   Final diagnoses:  Pseudoaneurysm of femoral artery (HCC)  Rx / DC Orders   ED Discharge Orders     None        Note:  This document was prepared using Dragon voice recognition software and may include unintentional dictation errors.   Victorino Dike, FNP 01/03/22 2054    Naaman Plummer, MD 01/03/22 [phone]

## 2022-01-03 NOTE — Telephone Encounter (Signed)
Patient advised as below. She reports she will be going to ED today.

## 2022-01-03 NOTE — Telephone Encounter (Signed)
Strongly recommend ED evaluation with report of patient's condition and recent procedure. Symptoms are concerning and patient needs assessment of the area soon as possible as well as monitoring of vital signs. Do not advise delaying evaluation.    Eulis Foster, MD  [facility]  [phone]

## 2022-01-03 NOTE — ED Triage Notes (Signed)
Pt comes with c/o left sided abscess. Pt states ablation 3 months ago. Pt states she has had spot there since but just on Friday it got worse pt states pain and swelling. No redness or drainage.

## 2022-01-03 NOTE — Telephone Encounter (Signed)
  Chief Complaint: knot in leg at incision from cardiac ablation Symptoms: just appeared under skin, not a color, not warm Frequency: started Friday Pertinent Negatives: Patient denies fever Disposition: '[x]'$ ED /'[]'$ Urgent Care (no appt availability in office) / '[]'$ Appointment(In office/virtual)/ '[]'$  [place] [facility]/ '[]'$ Home Care/ '[]'$ Refused Recommended Disposition /'[]'$ [facility]/ '[]'$  Follow-up with PCP Additional Notes: Pt had ablation for atrial fib and this knot has come up in the incision site on her left leg. ED advised due to it could be a clot and the pt said that is what she is afraid of but pt is too busy to go to ED. She might can go tomorrow. She raises her grandchildren and she is picking them up at school and then one has a concert tonight.   Reason for Disposition  Patient sounds very sick or weak to the triager  Answer Assessment - Initial Assessment Questions 1. APPEARANCE of SWELLING: "What does it look like?"     Looks like clot 2. SIZE: "How large is the swelling?" (e.g., inches, cm; or compare to size of pinhead, tip of pen, eraser, coin, pea, grape, ping pong ball)      Size of a ping pong ball 3. LOCATION: "Where is the swelling located?"     At site of ablation on left leg 4. ONSET: "When did the swelling start?"     Friday 5. COLOR: "What color is it?" "Is there more than one color?"     No color, not warm or red 6. PAIN: "Is there any pain?" If Yes, ask: "How bad is the pain?" (e.g., scale 1-10; or mild, moderate, severe)     - NONE (0): no pain   - MILD (1-3): doesn't interfere with normal activities    - MODERATE (4-7): interferes with normal activities or awakens from sleep    - SEVERE (8-10): excruciating pain, unable to do any normal activities     Has a sharp stabbing pain 7. ITCH: "Does it itch?" If Yes, ask: "How bad is the itch?"      no 8. CAUSE: "What do you think caused the swelling?" 9 OTHER SYMPTOMS: "Do you have any other symptoms?"  (e.g., fever)     Worried that it is a clot.  Protocols used: Skin Lump or Localized Swelling-A-AH

## 2022-01-04 ENCOUNTER — Inpatient Hospital Stay: Payer: Medicare Other

## 2022-01-04 DIAGNOSIS — K8689 Other specified diseases of pancreas: Secondary | ICD-10-CM

## 2022-01-04 DIAGNOSIS — K219 Gastro-esophageal reflux disease without esophagitis: Secondary | ICD-10-CM

## 2022-01-04 DIAGNOSIS — J45909 Unspecified asthma, uncomplicated: Secondary | ICD-10-CM

## 2022-01-04 DIAGNOSIS — I724 Aneurysm of artery of lower extremity: Secondary | ICD-10-CM | POA: Diagnosis not present

## 2022-01-04 DIAGNOSIS — M792 Neuralgia and neuritis, unspecified: Secondary | ICD-10-CM | POA: Diagnosis not present

## 2022-01-04 DIAGNOSIS — Z7901 Long term (current) use of anticoagulants: Secondary | ICD-10-CM

## 2022-01-04 DIAGNOSIS — I4891 Unspecified atrial fibrillation: Secondary | ICD-10-CM

## 2022-01-04 DIAGNOSIS — Z9889 Other specified postprocedural states: Secondary | ICD-10-CM

## 2022-01-04 DIAGNOSIS — I480 Paroxysmal atrial fibrillation: Secondary | ICD-10-CM | POA: Diagnosis not present

## 2022-01-04 LAB — URINALYSIS, ROUTINE W REFLEX MICROSCOPIC
Bacteria, UA: NONE SEEN
Bilirubin Urine: NEGATIVE
Glucose, UA: NEGATIVE mg/dL
Ketones, ur: NEGATIVE mg/dL
Leukocytes,Ua: NEGATIVE
Nitrite: NEGATIVE
Protein, ur: NEGATIVE mg/dL
Specific Gravity, Urine: 1.003 — ABNORMAL LOW (ref 1.005–1.030)
pH: 6 (ref 5.0–8.0)

## 2022-01-04 LAB — CBC
HCT: 35.8 % — ABNORMAL LOW (ref 36.0–46.0)
Hemoglobin: 12.2 g/dL (ref 12.0–15.0)
MCH: 29.3 pg (ref 26.0–34.0)
MCHC: 34.1 g/dL (ref 30.0–36.0)
MCV: 85.9 fL (ref 80.0–100.0)
Platelets: 283 10*3/uL (ref 150–400)
RBC: 4.17 MIL/uL (ref 3.87–5.11)
RDW: 12.5 % (ref 11.5–15.5)
WBC: 5.9 10*3/uL (ref 4.0–10.5)
nRBC: 0 % (ref 0.0–0.2)

## 2022-01-04 LAB — COMPREHENSIVE METABOLIC PANEL
ALT: 10 U/L (ref 0–44)
AST: 12 U/L — ABNORMAL LOW (ref 15–41)
Albumin: 3.3 g/dL — ABNORMAL LOW (ref 3.5–5.0)
Alkaline Phosphatase: 87 U/L (ref 38–126)
Anion gap: 7 (ref 5–15)
BUN: 11 mg/dL (ref 8–23)
CO2: 24 mmol/L (ref 22–32)
Calcium: 8.7 mg/dL — ABNORMAL LOW (ref 8.9–10.3)
Chloride: 108 mmol/L (ref 98–111)
Creatinine, Ser: 0.8 mg/dL (ref 0.44–1.00)
GFR, Estimated: >60 mL/min (ref >60)
Glucose, Bld: 97 mg/dL (ref 70–99)
Potassium: 4 mmol/L (ref 3.5–5.1)
Sodium: 139 mmol/L (ref 135–145)
Total Bilirubin: 0.5 mg/dL (ref 0.3–1.2)
Total Protein: 6 g/dL — ABNORMAL LOW (ref 6.5–8.1)

## 2022-01-04 LAB — APTT
aPTT: 35 seconds (ref 24–36)
aPTT: 65 seconds — ABNORMAL HIGH (ref 24–36)

## 2022-01-04 LAB — HEPARIN LEVEL (UNFRACTIONATED): Heparin Unfractionated: 0.89 IU/mL — ABNORMAL HIGH (ref 0.30–0.70)

## 2022-01-04 MED ORDER — PREDNISONE 50 MG PO TABS: 50.0000 mg | ORAL_TABLET | Freq: Four times a day (QID) | ORAL | Status: DC

## 2022-01-04 MED ORDER — DIPHENHYDRAMINE HCL 25 MG PO CAPS: 50.0000 mg | ORAL_CAPSULE | ORAL | Status: DC

## 2022-01-04 MED ORDER — PANCRELIPASE (LIP-PROT-AMYL) 36000-114000 UNITS PO CPEP: 36000.0000 [IU] | ORAL_CAPSULE | Freq: Two times a day (BID) | ORAL | Status: DC | PRN

## 2022-01-04 MED ORDER — HEPARIN (PORCINE) 25000 UT/250ML-% IV SOLN
1100.0000 [IU]/h | INTRAVENOUS | Status: DC
  Administered 2022-01-04: 1000 [IU]/h via INTRAVENOUS
  Filled 2022-01-04: qty 250

## 2022-01-04 MED ORDER — PANCRELIPASE (LIP-PROT-AMYL) 36000-114000 UNITS PO CPEP
72000.0000 [IU] | ORAL_CAPSULE | Freq: Three times a day (TID) | ORAL | Status: DC
  Administered 2022-01-04 – 2022-01-07 (×6): 72000 [IU] via ORAL
  Filled 2022-01-04 (×2): qty 2
  Filled 2022-01-04: qty 6
  Filled 2022-01-04 (×10): qty 2

## 2022-01-04 MED ORDER — IOHEXOL 350 MG/ML SOLN
100.0000 mL | Freq: Once | INTRAVENOUS | Status: AC | PRN
  Administered 2022-01-04: 100 mL via INTRAVENOUS

## 2022-01-04 MED ORDER — HEPARIN BOLUS VIA INFUSION
1000.0000 [IU] | Freq: Once | INTRAVENOUS | Status: AC
  Administered 2022-01-04: 1000 [IU] via INTRAVENOUS
  Filled 2022-01-04: qty 1000

## 2022-01-04 NOTE — Assessment & Plan Note (Addendum)
3.5 cm pseudoaneurysm left groin.  Holding Eliquis.  N.p.o. for vascular procedure today.  Can resume diet after procedure.

## 2022-01-04 NOTE — Consult Note (Signed)
ANTICOAGULATION CONSULT NOTE  Pharmacy Consult for Heparin Infusion Indication: atrial fibrillation  Patient Measurements: Height: '5\' 4"'$  (162.6 cm) Weight: 71.5 kg (157 lb 10.1 oz) IBW/kg (Calculated) : 54.7 Heparin Dosing Weight: 69.3 kg  Labs: Recent Labs    01/03/22 2100 01/04/22 0553 01/04/22 1327 01/04/22 2036  HGB 14.0 12.2  --   --   HCT 41.8 35.8*  --   --   PLT 350 283  --   --   APTT  --   --  35 65*  LABPROT 14.8  --   --   --   INR 1.2  --   --   --   HEPARINUNFRC  --   --  0.89*  --   CREATININE 0.89 0.80  --   --      Estimated Creatinine Clearance: 70.7 mL/min (by C-G formula based on SCr of 0.8 mg/dL).   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    Chronic headaches    Chronic, continuous use of opioids    DDD (degenerative disc disease)    CERVICAL AND LUMBAR-CHRONIC PAIN, RT HIP LABRAL TEAR   Depression    PT STATES A LOT OF STRESS IN HER LIFE   Dissociative disorder    Dizziness 04/22/2013   Duodenal ulcer with hemorrhage and perforation ([place]) 04/27/2003   Foot drop, right    FROM BACK SURGERY   GERD (gastroesophageal reflux disease)    H/O arthrodesis 08/18/2014   Headache(784.0)    AND NECK PAIN--STATES RECENT TEST SHOW CERVICAL DEGENERATION   History of blood transfusion    s/p back surgery   History of cardiac cath    a. 05/2019 Cath: Nl cors. EF 55-65%.   History of cardioversion    History of cervical spinal surgery 01/04/2015   History of kidney stones    Hypertension    Inverted T wave    Iron deficiency 02/01/2021   Mitral regurgitation    a.  07/2020 Echo: EF 60-65%, no rwma. Nl RV size/fxn. RVSP 39.74mHg. Mildly to mod dil LA. Mod MR; b. 07/2020 TEE: EF 55-60%. Lambl's excresence. Nl RV size/fxn. Midly dil LA. No LA/LAA thrombus. Mild MR.   Narrowing of intervertebral disc space 08/18/2014   Currently on disability.    Orthostatic hypotension 04/22/2013   Pain    CHRONIC NECK AND BACK PAIN - LIMITED ROM NECK - S/P FUSIONS CERVICAL AND LUMBAR   [place] 2021   a. 07/2020 s/p PVI.   Pneumonia    PONV (postoperative nausea and vomiting)    PT GIVES HX OF N&V AND FEVER WITH SURGERIES YEARS AGO--BUT NO PROBLEMS WITH MORE RECENT SURGERIES--STATES NOT MALIGNANT HYPERTHERMIA   Postop Hyponatremia 05/14/2012   Postoperative anemia due to acute blood loss 05/14/2012   PTSD (post-traumatic stress disorder)    Right foot drop    Right hip arthralgia 08/18/2014   Status post surgery of right, and now needs left.    Sleep apnea 2021   does not have a cpap  Therapeutic opioid-induced constipation (OIC)    Typical atrial flutter (HCC)     Medications:  Patient takes Eliquis 5 mg po BID, per patient last dose was in the morning on 01/03/22.  Assessment: 61 yo female who presented to the ED with a left femoral pseudoaneurysm.  History of Afib.  Pharmacy consulted to convert to heparin infusion while hospitalized.  Baseline INR 1.2, aPTT 35s, HL 0.89. Baseline CBC with H&H and platelets within normal limits.  Goal of Therapy:  aPTT 66-102 seconds Heparin level 0.3-0.7 units/ml once APTT and heparin level correlate Monitor platelets by anticoagulation protocol: Yes  1025 2036 aPTT 65s, subthera; 1000 un/hr   Plan:  --Heparin 1000 unit IV bolus and increase heparin infusion to 1100 units/hr --Re-check aPTT 6 hours from rate change; daily HL until correlation established and then switch over to HL monitoring --Daily CBC per protocol while on IV heparin  Benita Gutter 01/04/2022,9:01 PM

## 2022-01-04 NOTE — Plan of Care (Signed)

## 2022-01-04 NOTE — Progress Notes (Signed)
Mobility Specialist - Progress Note   01/04/22 1405  Mobility  Activity Ambulated independently in hallway;Stood at bedside;Dangled on edge of bed  Level of Assistance Independent  Assistive Device None  Distance Ambulated (ft) 160 ft  Activity Response Tolerated well  $Mobility charge 1 Mobility   Pt supine in bed on RA upon arrival. Pt STS and ambulates 1 lap around Ns indep. Pt returns to bed with needs in reach.   Gretchen Short  Mobility Specialist  01/04/22 2:06 PM

## 2022-01-04 NOTE — Progress Notes (Signed)
       CROSS COVER NOTE  NAME: [name] MRN: [id number] DOB : 24-Oct-1960 ATTENDING PHYSICIAN: Elwyn Reach, MD    Date of Service   01/04/2022   HPI/Events of Note   Message received from nursing communicating patient report of urinary frequency, patient also reports a history of frequent UTIs.  Interventions   Assessment/Plan:  UA--> Negative     This document was prepared using Dragon voice recognition software and may include unintentional dictation errors.  Neomia Glass DNP, MBA, FNP-BC Nurse Practitioner [facility] Pager [phone]

## 2022-01-04 NOTE — Assessment & Plan Note (Addendum)
Continue Creon with meals. 

## 2022-01-04 NOTE — Assessment & Plan Note (Addendum)
Paroxysmal in nature.  Resumed Eliquis prior to discharge.  Patient on Cardizem CD.  She states she does not take amiodarone anymore.  This medication was discontinued.

## 2022-01-04 NOTE — Progress Notes (Signed)
  Progress Note   Patient: [name] LAG:[id number] DOB: 1960-09-30 DOA: 01/03/2022     1 DOS: the patient was seen and examined on 01/04/2022     Assessment and Plan: * Pseudoaneurysm of left femoral artery (HCC) Pseudoaneurysm left groin.  Case discussed with vascular surgery for procedure on Friday.  Holding Eliquis.  Atrial fibrillation (HCC) Paroxysmal in nature.  Holding Eliquis.  Heparin drip in the meantime to prevent stroke.  Cardizem CD and amiodarone for rate control.  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  Had a procedure around 3 months ago in the groin.  Found to have a pseudoaneurysm.  Physical Exam: Vitals:   01/04/22 0024 01/04/22 0355 01/04/22 0738 01/04/22 1105  BP: 112/76 106/63 126/76 116/71  Pulse: 63 (!) 57 66 63  Resp: '20 20 19 19  '$ Temp: 97.9 F (36.6 C) 98.1 F (36.7 C) 98.1 F (36.7 C) 98 F (36.7 C)  TempSrc:  Oral  Esophageal  SpO2: 99% 100% 100% 100%  Weight:      Height:       Physical Exam HENT:     Head: Normocephalic.     Mouth/Throat:     Pharynx: No oropharyngeal exudate.  Eyes:     General: Lids are normal.     Conjunctiva/sclera: Conjunctivae normal.  Cardiovascular:     Rate and Rhythm: Normal rate and regular rhythm.     Heart sounds: Normal heart sounds, S1 normal and S2 normal.  Pulmonary:     Breath sounds: No decreased breath sounds, wheezing, rhonchi or rales.  Abdominal:     Palpations: Abdomen is soft.     Tenderness: There is no abdominal tenderness.  Musculoskeletal:     Right lower leg: No swelling.     Left lower leg: No swelling.  Skin:    General: Skin is warm.     Findings: No rash.  Neurological:     Mental Status: She is alert and oriented to person, place, and time.     Data  Reviewed: Creatinine 0.8, CBC within normal limits  Family Communication: Updated husband on the phone  Disposition: Status is: Inpatient Remains inpatient appropriate because: Will need vascular procedure on Friday  Planned Discharge Destination: Home    Time spent: 28 minutes  Author: Loletha Grayer, MD 01/04/2022 3:34 PM  For on call review [email or web address].

## 2022-01-04 NOTE — Consult Note (Signed)
$'@LOGO'w$ @   MRN : [id number]  [name] is a 61 y.o. (Jun 15, 1960) female who presents with chief complaint of check circulation.  History of Present Illness:   I am asked to evaluate the patient by Dr. Bobbye Charleston.  Patient is a 61 year old woman who presented to the emergency room last night secondary to increasing pain and an increasing mass in the left groin.  She has a history of ablation at [facility] for her chronic atrial fibrillation.  Review of the operative note for that procedure does note that they did access the left groin.  Post procedure the patient did not identify any particular issues with the left groin site no excessive bruising no excessive pain no mass or fullness.  Perhaps a week or so ago she started to have increasing tenderness in this area and over the past few days noticed an increased size as well as increased pain in the left groin.  She denies any radiation down the leg.  She denies any problems with walking.  Resting seems to make it better.  She is taking Eliquis for her atrial fibrillation..  Duplex ultrasound of the left groin and the femoral vessels obtained in the emergency room last night suggests a pseudoaneurysm.  Current Meds  Medication Sig   ALPRAZolam (XANAX) 1 MG tablet Take 1 mg by mouth at bedtime.    apixaban (ELIQUIS) 5 MG TABS tablet Take 1 tablet (5 mg total) by mouth 2 (two) times daily.   busPIRone (BUSPAR) 15 MG tablet Take 15 mg by mouth 2 (two) times daily.   CREON 36000-114000 units CPEP capsule TAKE 2 CAPSULES WITH THE FIRST BITE OF EACH MEAL AND 1 CAPSULE WITH THE FIRST BITE OF EACH SNACK   diltiazem (CARDIZEM CD) 120 MG 24 hr capsule Take 1 capsule (120 mg total) by mouth daily.   linaclotide (LINZESS) 290 MCG CAPS capsule Take 1 capsule (290 mcg total) by mouth daily before breakfast.   montelukast (SINGULAIR) 10 MG tablet TAKE 1 TABLET BY MOUTH EVERY DAY   omeprazole (PRILOSEC) 40 MG capsule TAKE 1 CAPSULE (40 MG TOTAL)  BY MOUTH 2 (TWO) TIMES DAILY BEFORE A MEAL.   valACYclovir (VALTREX) 1000 MG tablet TAKE TWO TABLETS BY MOUTH TWICE DAILY FOR ONE DAY FEVER BLISTER    Past Medical History:  Diagnosis Date   Abnormal EKG    HX OF INVERTED T WAVES ON EKG, PALPITATIONS, CHEST PAINS-CARDIAC WORK UP DID NOT SHOW ANY HEART DISEASE   AC (acromioclavicular) joint bone spurs    Acute postoperative pain 01/04/2017   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    Chronic headaches    Chronic, continuous use of opioids    DDD (degenerative disc disease)    CERVICAL AND LUMBAR-CHRONIC PAIN, RT HIP LABRAL TEAR   Depression    PT STATES A LOT OF STRESS IN HER LIFE   Dissociative disorder    Dizziness 04/22/2013   Duodenal ulcer with hemorrhage and perforation ([place]) 04/27/2003   Foot drop, right    FROM BACK SURGERY   GERD (gastroesophageal reflux disease)    H/O arthrodesis 08/18/2014   Headache(784.0)    AND NECK PAIN--STATES RECENT TEST SHOW CERVICAL DEGENERATION   History of blood transfusion    s/p  back surgery   History of cardiac cath    a. 05/2019 Cath: Nl cors. EF 55-65%.   History of cardioversion    History of cervical spinal surgery 01/04/2015   History of kidney stones    Hypertension    Inverted T wave    Iron deficiency 02/01/2021   Mitral regurgitation    a. 07/2020 Echo: EF 60-65%, no rwma. Nl RV size/fxn. RVSP 39.47mHg. Mildly to mod dil LA. Mod MR; b. 07/2020 TEE: EF 55-60%. Lambl's excresence. Nl RV size/fxn. Midly dil LA. No LA/LAA thrombus. Mild MR.   Narrowing of intervertebral disc space 08/18/2014   Currently on disability.    Orthostatic hypotension 04/22/2013   Pain    CHRONIC NECK AND BACK PAIN - LIMITED ROM NECK - S/P FUSIONS CERVICAL AND LUMBAR   [place]   a. 07/2020 s/p PVI.   Pneumonia    PONV (postoperative nausea  and vomiting)    PT GIVES HX OF N&V AND FEVER WITH SURGERIES YEARS AGO--BUT NO PROBLEMS WITH MORE RECENT SURGERIES--STATES NOT MALIGNANT HYPERTHERMIA   Postop Hyponatremia 05/14/2012   Postoperative anemia due to acute blood loss 05/14/2012   PTSD (post-traumatic stress disorder)    Right foot drop    Right hip arthralgia 08/18/2014   Status post surgery of right, and now needs left.    Sleep apnea 2021   does not have a cpap   Therapeutic opioid-induced constipation (OIC)    Typical atrial flutter ([facility]     Past Surgical History:  Procedure Laterality Date   ABDOMINAL HYSTERECTOMY     afib  05/2019   ANTERIOR CERVICAL DECOMP/DISCECTOMY FUSION N/A 10/30/2012   Procedure: ACDF C5-6, EXPLORATION AND HARDWARE REMOVAL C6-7;  Surgeon: DMelina Schools MD;  [place] [facility]  Service: Orthopedics;  Laterality: N/A;   ANTERIOR FUSION CERVICAL SPINE  MAY 2012   AT [place] 08/06/2020   Procedure: ATRIAL FIBRILLATION ABLATION;  Surgeon: LVickie Epley MD;  [place];  Service: Cardiovascular;  Laterality: N/A;   BACK SURGERY  2009   LUMBAR FUSION WITH RODS    BREAST BIOPSY Right 2008   benign.- bx/clip   BREAST EXCISIONAL BIOPSY Left 1998   benign   BREAST REDUCTION SURGERY Bilateral 06/2016   CARDIAC ELECTROPHYSIOLOGY MAPPING AND ABLATION     CARDIOVERSION N/A 12/03/2019   Procedure: CARDIOVERSION;  Surgeon: AKate Sable MD;  [facility];  Service: Cardiovascular;  Laterality: N/A;   CARDIOVERSION N/A 08/01/2021   Procedure: CARDIOVERSION;  Surgeon: AKate Sable MD;  Location: [facility];  Service: Cardiovascular;  Laterality: N/A;   CARDIOVERSION N/A 08/05/2021   Procedure: CARDIOVERSION;  Surgeon: GMinna Merritts MD;  Location: [facility] ORS;  Service: Cardiovascular;  Laterality: N/A;   CARPAL TUNNEL RELEASE  05-06-12   Right   CHOLECYSTECTOMY     COLONOSCOPY WITH PROPOFOL N/A 01/26/2017   Procedure: COLONOSCOPY WITH  PROPOFOL;  Surgeon: WLucilla Lame MD;  [facility] [facility]  Service: Endoscopy;  Laterality: N/A;   COLONOSCOPY WITH PROPOFOL N/A 01/16/2021   Procedure: COLONOSCOPY WITH PROPOFOL;  Surgeon: VLin Landsman MD;  [facility];  Service: Gastroenterology;  Laterality: N/A;   DIAGNOSTIC LAPAROSCOPIES - MULTIPLE FOR ENDOMETRIOSIS     ESOPHAGEAL DILATION N/A 01/26/2017   Procedure: ESOPHAGEAL DILATION;  Surgeon: WLucilla Lame MD;  [place]  Service: Endoscopy;  Laterality: N/A;   ESOPHAGEAL MANOMETRY N/A 05/30/2017   Procedure: ESOPHAGEAL MANOMETRY (  EM);  Surgeon: Lucilla Lame, MD;  Location: [facility] ENDOSCOPY;  Service: Endoscopy;  Laterality: N/A;   ESOPHAGOGASTRODUODENOSCOPY (EGD) WITH PROPOFOL N/A 01/26/2017   Procedure: ESOPHAGOGASTRODUODENOSCOPY (EGD) WITH PROPOFOL;  Surgeon: Lucilla Lame, MD;  Location: [place];  Service: Endoscopy;  Laterality: N/A;   ESOPHAGOGASTRODUODENOSCOPY (EGD) WITH PROPOFOL N/A 01/30/2020   Procedure: ESOPHAGOGASTRODUODENOSCOPY (EGD) WITH PROPOFOL;  Surgeon: Lucilla Lame, MD;  Location: [place];  Service: Endoscopy;  Laterality: N/A;  sleep apnea COVID + 01-15-20   ESOPHAGOGASTRODUODENOSCOPY (EGD) WITH PROPOFOL N/A 01/15/2021   Procedure: ESOPHAGOGASTRODUODENOSCOPY (EGD) WITH PROPOFOL;  Surgeon: Lin Landsman, MD;  Location: [place];  Service: Gastroenterology;  Laterality: N/A;   ESOPHAGOGASTRODUODENOSCOPY (EGD) WITH PROPOFOL N/A 12/07/2021   Procedure: ESOPHAGOGASTRODUODENOSCOPY (EGD) WITH PROPOFOL;  Surgeon: Lin Landsman, MD;  Location: [facility] ENDOSCOPY;  Service: Gastroenterology;  Laterality: N/A;   HIP ARTHROSCOPY  09/20/2011   Procedure: ARTHROSCOPY HIP;  Surgeon: Gearlean Alf, MD;  [facility];  Service: Orthopedics;  Laterality: Right;  Right Hip Scope with Labral Debridement   LEFT HEART CATH AND CORONARY ANGIOGRAPHY Left 06/10/2019   Procedure: LEFT HEART CATH AND  CORONARY ANGIOGRAPHY;  Surgeon: Nelva Bush, MD;  Location: [place] [facility] LAB;  Service: Cardiovascular;  Laterality: Left;   NASAL SEPTUM SURGERY  MARCH 2013   IN [place]   [facility] IMPEDANCE STUDY N/A 05/30/2017   Procedure: [place] IMPEDANCE STUDY;  Surgeon: Lucilla Lame, MD;  Location: [facility] ENDOSCOPY;  Service: Endoscopy;  Laterality: N/A;   POLYPECTOMY N/A 01/26/2017   Procedure: POLYPECTOMY;  Surgeon: Lucilla Lame, MD;  Location: [place];  Service: Endoscopy;  Laterality: N/A;   POSTERIOR CERVICAL FUSION/FORAMINOTOMY N/A 04/16/2013   Procedure: REMOVAL CERVICAL PLATES AND INTERBODY CAGE/POSTERIOR CERVICAL SPINAL FUSION C4 - C6/C5 CORPECTOMY/C4 - C6 FUSION WITH ILIAC CREST BONE GRAFT;  Surgeon: Melina Schools, MD;  Location: [place];  Service: Orthopedics;  Laterality: N/A;   RADIOFREQUENCY ABLATION NERVES     REDUCTION MAMMAPLASTY Bilateral 05/2016   RIGHT HIP ARTHROSCOPY FOR LABRAL TEAR  ABOUT 2010   2012 also   SHOULDER ARTHROSCOPY  05-06-12   bone spur   TEE WITHOUT CARDIOVERSION N/A 08/05/2020   Procedure: TRANSESOPHAGEAL ECHOCARDIOGRAM (TEE);  Surgeon: Lelon Perla, MD;  Location: [facility] ENDOSCOPY;  Service: Cardiovascular;  Laterality: N/A;   TONSILLECTOMY     AS A CHILD   TOTAL HIP ARTHROPLASTY Right 05/13/2012   Procedure: TOTAL HIP ARTHROPLASTY ANTERIOR APPROACH;  Surgeon: Gearlean Alf, MD;  [facility];  Service: Orthopedics;  Laterality: Right;    Social History Social History   Tobacco Use   Smoking status: Never   Smokeless tobacco: Never  Vaping Use   Vaping Use: Never used  Substance Use Topics   Alcohol use: No    Alcohol/week: 0.0 standard drinks of alcohol   Drug use: Yes    Comment: prescribed morphine and xanax    Family History Family History  Problem Relation Age of Onset   Aneurysm Mother    Bipolar disorder Sister    Aneurysm Maternal Grandmother    Breast cancer Paternal Grandmother 25   Bipolar disorder Grandchild    Anxiety  disorder Grandchild    Depression Grandchild    Cancer Neg Hx     Allergies  Allergen Reactions   Amoxicillin Hives    She did ok w ANCEF   Chlorhexidine Gluconate Dermatitis and Hives   Clindamycin Hives   Codeine Hives   Erythromycin Hives    "mycins"  in general   Penicillin G Hives    "cillins" in general   Sulfa Antibiotics Nausea And Vomiting and Hives        Levofloxacin Hives   Shellfish Allergy Hives   Decadron [Dexamethasone] Other (See Comments)    Hot flashes, insomnia, "manic" Hot flashes, insomnia, "manic"   Mangifera Indica Hives    papaya   Papaya Derivatives Hives   Betadine [Povidone Iodine] Hives        Clarithromycin Hives   Other Hives     Mango    Povidone-Iodine Hives   Prednisone Anxiety    High blood pressure, flushed, mood changes, heart palpitations High blood pressure, flushed, mood changes, heart palpitations     REVIEW OF SYSTEMS (Negative unless checked)  Constitutional: '[]'$ Weight loss  '[]'$ Fever  '[]'$ Chills Cardiac: '[]'$ Chest pain   '[]'$ Chest pressure   '[]'$ Palpitations   '[]'$ Shortness of breath when laying flat   '[]'$ Shortness of breath with exertion. Vascular:  '[]'$ Pain in legs with walking   '[]'$ Pain in legs at rest  '[]'$ History of DVT   '[]'$ Phlebitis   '[]'$ Swelling in legs   '[]'$ Varicose veins   '[]'$ Non-healing ulcers Pulmonary:   '[]'$ Uses home oxygen   '[]'$ Productive cough   '[]'$ Hemoptysis   '[]'$ Wheeze  '[]'$ COPD   '[]'$ Asthma Neurologic:  '[]'$ Dizziness   '[]'$ Seizures   '[]'$ History of stroke   '[]'$ History of TIA  '[]'$ Aphasia   '[]'$ Vissual changes   '[]'$ Weakness or numbness in arm   '[]'$ Weakness or numbness in leg Musculoskeletal:   '[]'$ Joint swelling   '[]'$ Joint pain   '[]'$ Low back pain Hematologic:  '[]'$ Easy bruising  '[]'$ Easy bleeding   '[]'$ Hypercoagulable state   '[]'$ Anemic Gastrointestinal:  '[]'$ Diarrhea   '[]'$ Vomiting  '[x]'$ Gastroesophageal reflux/heartburn   '[]'$ Difficulty swallowing. Genitourinary:  '[]'$ Chronic kidney disease   '[]'$ Difficult urination  '[]'$ Frequent urination   '[]'$ Blood in urine Skin:  '[]'$ Rashes    '[]'$ Ulcers  Psychological:  '[]'$ History of anxiety   '[]'$  History of major depression.  Physical Examination  Vitals:   01/04/22 0355 01/04/22 0738 01/04/22 1105 01/04/22 1545  BP: 106/63 126/76 116/71 106/73  Pulse: (!) 57 66 63 60  Resp: '20 19 19 19  '$ Temp: 98.1 F (36.7 C) 98.1 F (36.7 C) 98 F (36.7 C) 98.3 F (36.8 C)  TempSrc: Oral  Esophageal Oral  SpO2: 100% 100% 100% 100%  Weight:      Height:       Body mass index is 27.06 kg/m. Gen: WD/WN, NAD Head: [place]/AT, No temporalis wasting.  Ear/Nose/Throat: Hearing grossly intact, nares w/o erythema or drainage Eyes: PER, EOMI, sclera nonicteric.  Neck: Supple, no masses.  No bruit or JVD.  Pulmonary:  Good air movement, no audible wheezing, no use of accessory muscles.  Cardiac: RRR, normal S1, S2, no Murmurs. Vascular: Left groin is soft there is a soft fullness at the level of the ileal inguinal crease.  There is no hardness that would suggest a pseudoaneurysm.  There is no pulsatility. Vessel Right Left  Radial Palpable Palpable  PT   Palpable  DP   Palpable  Gastrointestinal: soft, non-distended. No guarding/no peritoneal signs.  Musculoskeletal: M/S 5/5 throughout.  No visible deformity.  Neurologic: CN 2-12 intact. Pain and light touch intact in extremities.  Symmetrical.  Speech is fluent. Motor exam as listed above. Psychiatric: Judgment intact, Mood & affect appropriate for pt's clinical situation. Dermatologic: No rashes or ulcers noted.  No changes consistent with cellulitis.   CBC Lab Results  Component Value Date   WBC 5.9 01/04/2022  HGB 12.2 01/04/2022   HCT 35.8 (L) 01/04/2022   MCV 85.9 01/04/2022   PLT 283 01/04/2022    BMET    Component Value Date/Time   NA 139 01/04/2022 0553   NA 140 01/19/2021 1055   NA 140 02/26/2013 1315   K 4.0 01/04/2022 0553   K 4.0 02/26/2013 1315   CL 108 01/04/2022 0553   CL 108 (H) 02/26/2013 1315   CO2 24 01/04/2022 0553   CO2 27 02/26/2013 1315   GLUCOSE 97  01/04/2022 0553   GLUCOSE 95 02/26/2013 1315   BUN 11 01/04/2022 0553   BUN 7 (L) 01/19/2021 1055   BUN 13 02/26/2013 1315   CREATININE 0.80 01/04/2022 0553   CREATININE 0.85 12/07/2016 1228   CALCIUM 8.7 (L) 01/04/2022 0553   CALCIUM 9.2 02/26/2013 1315   GFRNONAA >60 01/04/2022 0553   GFRNONAA >60 02/26/2013 1315   GFRAA >60 11/26/2019 1213   GFRAA >60 02/26/2013 1315   Estimated Creatinine Clearance: 70.7 mL/min (by C-G formula based on SCr of 0.8 mg/dL).  COAG Lab Results  Component Value Date   INR 1.2 01/03/2022   INR 1.6 (H) 08/05/2021   INR 1.2 02/20/2020    Radiology [place] Lower Ext Art Left Ltd  Result Date: 01/03/2022 CLINICAL DATA:  Tenderness in the left groin 3 months after cardiac ablation. EXAM: Left LOWER EXTREMITY ARTERIAL DUPLEX SCAN TECHNIQUE: Gray-scale sonography as well as color Doppler and duplex ultrasound of the left groin was performed. COMPARISON:  CT abdomen and pelvis 07/28/2021 FINDINGS: Left lower Extremity Focused examination was obtained of the left groin to evaluate the area of tenderness. Grayscale images, color flow Doppler images, and spectral Doppler imaging was obtained. Corresponding to the area of tenderness, there is a 3.7 x 2.6 x 3.9 cm diameter vascular collection containing flow on color flow Doppler imaging. A communication is demonstrated between this structure and the femoral artery and vein. The lesion decreases but remains present with compression. No mural thrombus is identified. IMPRESSION: Left groin pseudoaneurysm with direct communication to the left common femoral artery and vein. Electronically Signed   By: Lucienne Capers M.D.   On: 01/03/2022 20:00   DG HIP UNILAT W OR W/O PELVIS 2-3 VIEWS RIGHT  Result Date: 12/16/2021 CLINICAL DATA:  Right hip pain. EXAM: DG HIP (WITH OR WITHOUT PELVIS) 2-3V RIGHT COMPARISON:  May 13, 2012. FINDINGS: Status post right total hip arthroplasty. The right femoral and acetabular components are  well situated. No fracture or dislocation is noted. IMPRESSION: No acute abnormality seen. Electronically Signed   By: Marijo Conception M.D.   On: 12/16/2021 11:35   DG Lumbar Spine Complete W/Bend  Result Date: 12/16/2021 CLINICAL DATA:  LOWER back pain. EXAM: LUMBAR SPINE - COMPLETE WITH BENDING VIEWS COMPARISON:  05/20/2018 FINDINGS: Prior posterior fusion of T10 through L4. Normal alignment. No translocation with flexion or extension. No lytic or blastic lesions. No hardware failure or acute fracture. Bowel gas pattern is nonobstructive. Surgical clips are present in the abdomen. Prior RIGHT hip arthroplasty. IMPRESSION: Prior posterior fusion. No evidence for instability. Electronically Signed   By: Nolon Nations M.D.   On: 12/16/2021 10:51     Assessment/Plan 1.  Left groin pseudoaneurysm: The patient's history and clinical evaluation are not suggestive of a pseudoaneurysm although the duplex ultrasound does appear to document this problem.  Given this discrepancy of it discussed with the patient obtaining a CT angiogram to better characterize the left groin.  I have  also discussed with her that the treatment for the pseudoaneurysm if confirmed would certainly include a left lower extremity angiogram.  We have agreed we will proceed with a CT scan to evaluate her left groin more thoroughly and then base further decision-making on this finding.  2.  Atrial fibrillation: Continue antiarrhythmia medications as already ordered, these medications have been reviewed and there are no changes at this time.  Patient is currently on a heparin drip and her Eliquis has been held as is appropriate if indeed angiography is needed.  3.  Asthma: Continue pulmonary medications and aerosols as already ordered, these medications have been reviewed and there are no changes at this time.      4.  GERD: Continue PPI as already ordered, this medication has been reviewed and there are no changes at this  time.  Avoidence of caffeine and alcohol  Moderate elevation of the head of the bed     Hortencia Pilar, MD  01/04/2022 6:05 PM

## 2022-01-04 NOTE — Consult Note (Signed)
ANTICOAGULATION CONSULT NOTE - Initial Consult  Pharmacy Consult for heparin infusion Indication: atrial fibrillation  Allergies  Allergen Reactions   Amoxicillin Hives    She did ok w ANCEF   Chlorhexidine Gluconate Dermatitis and Hives   Clindamycin Hives   Codeine Hives   Erythromycin Hives    "mycins" in general   Penicillin G Hives    "cillins" in general   Sulfa Antibiotics Nausea And Vomiting and Hives        Levofloxacin Hives   Shellfish Allergy Hives   Decadron [Dexamethasone] Other (See Comments)    Hot flashes, insomnia, "manic" Hot flashes, insomnia, "manic"   Mangifera Indica Hives    papaya   Papaya Derivatives Hives   Betadine [Povidone Iodine] Hives        Clarithromycin Hives   Other Hives     Mango    Povidone-Iodine Hives   Prednisone Anxiety    High blood pressure, flushed, mood changes, heart palpitations High blood pressure, flushed, mood changes, heart palpitations    Patient Measurements: Height: '5\' 4"'$  (162.6 cm) Weight: 71.5 kg (157 lb 10.1 oz) IBW/kg (Calculated) : 54.7 Heparin Dosing Weight: 69.3 kg  Vital Signs: Temp: 98 F (36.7 C) (10/25 1105) Temp Source: Esophageal (10/25 1105) BP: 116/71 (10/25 1105) Pulse Rate: 63 (10/25 1105)  Labs: Recent Labs    01/03/22 2100 01/04/22 0553  HGB 14.0 12.2  HCT 41.8 35.8*  PLT 350 283  LABPROT 14.8  --   INR 1.2  --   CREATININE 0.89 0.80    Estimated Creatinine Clearance: 70.7 mL/min (by C-G formula based on SCr of 0.8 mg/dL).   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    Chronic headaches     Chronic, continuous use of opioids    DDD (degenerative disc disease)    CERVICAL AND LUMBAR-CHRONIC PAIN, RT HIP LABRAL TEAR   Depression    PT STATES A LOT OF STRESS IN HER LIFE   Dissociative disorder    Dizziness 04/22/2013   Duodenal ulcer with hemorrhage and perforation ([place]) 04/27/2003   Foot drop, right    FROM BACK SURGERY   GERD (gastroesophageal reflux disease)    H/O arthrodesis 08/18/2014   Headache(784.0)    AND NECK PAIN--STATES RECENT TEST SHOW CERVICAL DEGENERATION   History of blood transfusion    s/p back surgery   History of cardiac cath    a. 05/2019 Cath: Nl cors. EF 55-65%.   History of cardioversion    History of cervical spinal surgery 01/04/2015   History of kidney stones    Hypertension    Inverted T wave    Iron deficiency 02/01/2021   Mitral regurgitation    a. 07/2020 Echo: EF 60-65%, no rwma. Nl RV size/fxn. RVSP 39.9mHg. Mildly to mod dil LA. Mod MR; b. 07/2020 TEE: EF 55-60%. Lambl's excresence. Nl RV size/fxn. Midly dil LA. No LA/LAA thrombus. Mild MR.   Narrowing of intervertebral disc space 08/18/2014   Currently on disability.    Orthostatic hypotension 04/22/2013  Pain    CHRONIC NECK AND BACK PAIN - LIMITED ROM NECK - S/P FUSIONS CERVICAL AND LUMBAR   Paroxysmal atrial fibrillation ([place]) 2021   a. 07/2020 s/p PVI.   Pneumonia    PONV (postoperative nausea and vomiting)    PT GIVES HX OF N&V AND FEVER WITH SURGERIES YEARS AGO--BUT NO PROBLEMS WITH MORE RECENT SURGERIES--STATES NOT MALIGNANT HYPERTHERMIA   Postop Hyponatremia 05/14/2012   Postoperative anemia due to acute blood loss 05/14/2012   PTSD (post-traumatic stress disorder)    Right foot drop    Right hip arthralgia 08/18/2014   Status post surgery of right, and now needs left.    Sleep apnea 2021   does not have a cpap   Therapeutic opioid-induced constipation (OIC)    Typical atrial flutter ([facility])     Medications:  Patient takes Eliquis 5 mg po BID, per patient last  dose was in the morning on 01/03/22.  Assessment: 61 yo female who presented to the ED with a left femoral pseudoaneurysm.  History of Afib.  Pharmacy consulted to convert to heparin infusion while hospitalized.  Goal of Therapy:  aPTT 66-102 seconds Heparin level 0.3-0.7 units/ml once APTT and heparin level correlate Monitor platelets by anticoagulation protocol: Yes   Plan:  Start heparin infusion at 1000 units/hr Check aPTT in 6 hours and daily while on heparin Check heparin level with AM labs Continue to monitor H&H and platelets  Lorin Picket, PharmD 01/04/2022,1:05 PM

## 2022-01-04 NOTE — Assessment & Plan Note (Addendum)
On chronic pain meds.  I did not prescribe her chronic pain medications only a few pills of acute medications to go home with after procedure.

## 2022-01-04 NOTE — Assessment & Plan Note (Signed)
On Protonix 

## 2022-01-04 NOTE — H&P (View-Only) (Signed)
$'@LOGO'X$ @   MRN : [id number]  [name] is a 61 y.o. (09-16-1960) female who presents with chief complaint of check circulation.  History of Present Illness:   I am asked to evaluate the patient by Dr. Bobbye Charleston.  Patient is a 61 year old woman who presented to the emergency room last night secondary to increasing pain and an increasing mass in the left groin.  She has a history of ablation at [facility] for her chronic atrial fibrillation.  Review of the operative note for that procedure does note that they did access the left groin.  Post procedure the patient did not identify any particular issues with the left groin site no excessive bruising no excessive pain no mass or fullness.  Perhaps a week or so ago she started to have increasing tenderness in this area and over the past few days noticed an increased size as well as increased pain in the left groin.  She denies any radiation down the leg.  She denies any problems with walking.  Resting seems to make it better.  She is taking Eliquis for her atrial fibrillation..  Duplex ultrasound of the left groin and the femoral vessels obtained in the emergency room last night suggests a pseudoaneurysm.  Current Meds  Medication Sig   ALPRAZolam (XANAX) 1 MG tablet Take 1 mg by mouth at bedtime.    apixaban (ELIQUIS) 5 MG TABS tablet Take 1 tablet (5 mg total) by mouth 2 (two) times daily.   busPIRone (BUSPAR) 15 MG tablet Take 15 mg by mouth 2 (two) times daily.   CREON 36000-114000 units CPEP capsule TAKE 2 CAPSULES WITH THE FIRST BITE OF EACH MEAL AND 1 CAPSULE WITH THE FIRST BITE OF EACH SNACK   diltiazem (CARDIZEM CD) 120 MG 24 hr capsule Take 1 capsule (120 mg total) by mouth daily.   linaclotide (LINZESS) 290 MCG CAPS capsule Take 1 capsule (290 mcg total) by mouth daily before breakfast.   montelukast (SINGULAIR) 10 MG tablet TAKE 1 TABLET BY MOUTH EVERY DAY   omeprazole (PRILOSEC) 40 MG capsule TAKE 1 CAPSULE (40 MG TOTAL)  BY MOUTH 2 (TWO) TIMES DAILY BEFORE A MEAL.   valACYclovir (VALTREX) 1000 MG tablet TAKE TWO TABLETS BY MOUTH TWICE DAILY FOR ONE DAY FEVER BLISTER    Past Medical History:  Diagnosis Date   Abnormal EKG    HX OF INVERTED T WAVES ON EKG, PALPITATIONS, CHEST PAINS-CARDIAC WORK UP DID NOT SHOW ANY HEART DISEASE   AC (acromioclavicular) joint bone spurs    Acute postoperative pain 01/04/2017   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    Chronic headaches    Chronic, continuous use of opioids    DDD (degenerative disc disease)    CERVICAL AND LUMBAR-CHRONIC PAIN, RT HIP LABRAL TEAR   Depression    PT STATES A LOT OF STRESS IN HER LIFE   Dissociative disorder    Dizziness 04/22/2013   Duodenal ulcer with hemorrhage and perforation ([place]) 04/27/2003   Foot drop, right    FROM BACK SURGERY   GERD (gastroesophageal reflux disease)    H/O arthrodesis 08/18/2014   Headache(784.0)    AND NECK PAIN--STATES RECENT TEST SHOW CERVICAL DEGENERATION   History of blood transfusion    s/p  back surgery   History of cardiac cath    a. 05/2019 Cath: Nl cors. EF 55-65%.   History of cardioversion    History of cervical spinal surgery 01/04/2015   History of kidney stones    Hypertension    Inverted T wave    Iron deficiency 02/01/2021   Mitral regurgitation    a. 07/2020 Echo: EF 60-65%, no rwma. Nl RV size/fxn. RVSP 39.3mHg. Mildly to mod dil LA. Mod MR; b. 07/2020 TEE: EF 55-60%. Lambl's excresence. Nl RV size/fxn. Midly dil LA. No LA/LAA thrombus. Mild MR.   Narrowing of intervertebral disc space 08/18/2014   Currently on disability.    Orthostatic hypotension 04/22/2013   Pain    CHRONIC NECK AND BACK PAIN - LIMITED ROM NECK - S/P FUSIONS CERVICAL AND LUMBAR   Paroxysmal atrial fibrillation ([place] 2021   a. 07/2020 s/p PVI.   Pneumonia    PONV (postoperative nausea  and vomiting)    PT GIVES HX OF N&V AND FEVER WITH SURGERIES YEARS AGO--BUT NO PROBLEMS WITH MORE RECENT SURGERIES--STATES NOT MALIGNANT HYPERTHERMIA   Postop Hyponatremia 05/14/2012   Postoperative anemia due to acute blood loss 05/14/2012   PTSD (post-traumatic stress disorder)    Right foot drop    Right hip arthralgia 08/18/2014   Status post surgery of right, and now needs left.    Sleep apnea 2021   does not have a cpap   Therapeutic opioid-induced constipation (OIC)    Typical atrial flutter ([facility]     Past Surgical History:  Procedure Laterality Date   ABDOMINAL HYSTERECTOMY     afib  05/2019   ANTERIOR CERVICAL DECOMP/DISCECTOMY FUSION N/A 10/30/2012   Procedure: ACDF C5-6, EXPLORATION AND HARDWARE REMOVAL C6-7;  Surgeon: DMelina Schools MD;  [place]  Service: Orthopedics;  Laterality: N/A;   ANTERIOR FUSION [place]ERVICAL [place]  [place]Y [place]   Procedure: ATRIAL FIBRILLATION ABLATION;  Surgeon: LVickie Epley MD;  [place];  Service: Cardiovascular;  Laterality: N/A;   BACK SURGERY  2009   LUMBAR FUSION WITH RODS    BREAST BIOPSY Right 2008   benign.- bx/clip   BREAST EXCISIONAL BIOPSY Left 1998   benign   BREAST REDUCTION SURGERY Bilateral 06/2016   CARDIAC ELECTROPHYSIOLOGY MAPPING AND ABLATION     CARDIOVERSION N/A 12/03/2019   Procedure: CARDIOVERSION;  Surgeon: AKate Sable MD;  [place];  Service: Cardiovascular;  Laterality: N/A;   CARDIOVERSION N/A 08/01/2021   Procedure: CARDIOVERSION;  Surgeon: AKate Sable MD;  Location: [facility] ORS;  Service: Cardiovascular;  Laterality: N/A;   CARDIOVERSION N/A 08/05/2021   Procedure: CARDIOVERSION;  Surgeon: GMinna Merritts MD;  Location: [facility] ORS;  Service: Cardiovascular;  Laterality: N/A;   CARPAL TUNNEL RELEASE  05-06-12   Right   CHOLECYSTECTOMY     COLONOSCOPY WITH PROPOFOL N/A 01/26/2017   Procedure: COLONOSCOPY WITH  PROPOFOL;  Surgeon: WLucilla Lame MD;  [place]  Service: Endoscopy;  Laterality: N/A;   COLONOSCOPY WITH PROPOFOL N/A 01/16/2021   Procedure: COLONOSCOPY WITH PROPOFOL;  Surgeon: VLin Landsman MD;  [facility];  Service: Gastroenterology;  Laterality: N/A;   DIAGNOSTIC LAPAROSCOPIES - MULTIPLE FOR ENDOMETRIOSIS     ESOPHAGEAL DILATION N/A 01/26/2017   Procedure: ESOPHAGEAL DILATION;  Surgeon: WLucilla Lame MD;  [place]  Service: Endoscopy;  Laterality: N/A;   ESOPHAGEAL MANOMETRY N/A 05/30/2017   Procedure: ESOPHAGEAL MANOMETRY (  EM);  Surgeon: Lucilla Lame, MD;  Location: [facility] ENDOSCOPY;  Service: Endoscopy;  Laterality: N/A;   ESOPHAGOGASTRODUODENOSCOPY (EGD) WITH PROPOFOL N/A 01/26/2017   Procedure: ESOPHAGOGASTRODUODENOSCOPY (EGD) WITH PROPOFOL;  Surgeon: Lucilla Lame, MD;  Location: [place];  Service: Endoscopy;  Laterality: N/A;   ESOPHAGOGASTRODUODENOSCOPY (EGD) WITH PROPOFOL N/A 01/30/2020   Procedure: ESOPHAGOGASTRODUODENOSCOPY (EGD) WITH PROPOFOL;  Surgeon: Lucilla Lame, MD;  Location: [place];  Service: Endoscopy;  Laterality: N/A;  sleep apnea COVID + 01-15-20   ESOPHAGOGASTRODUODENOSCOPY (EGD) WITH PROPOFOL N/A 01/15/2021   Procedure: ESOPHAGOGASTRODUODENOSCOPY (EGD) WITH PROPOFOL;  Surgeon: Lin Landsman, MD;  Location: [place];  Service: Gastroenterology;  Laterality: N/A;   ESOPHAGOGASTRODUODENOSCOPY (EGD) WITH PROPOFOL N/A 12/07/2021   Procedure: ESOPHAGOGASTRODUODENOSCOPY (EGD) WITH PROPOFOL;  Surgeon: Lin Landsman, MD;  Location: [facility] ENDOSCOPY;  Service: Gastroenterology;  Laterality: N/A;   HIP ARTHROSCOPY  09/20/2011   Procedure: ARTHROSCOPY HIP;  Surgeon: Gearlean Alf, MD;  [facility];  Service: Orthopedics;  Laterality: Right;  Right Hip Scope with Labral Debridement   LEFT HEART CATH AND CORONARY ANGIOGRAPHY Left 06/10/2019   Procedure: LEFT HEART CATH AND  CORONARY ANGIOGRAPHY;  Surgeon: Nelva Bush, MD;  Location: [place] CV LAB;  Service: Cardiovascular;  Laterality: Left;   NASAL SEPTUM SURGERY  MARCH 2013   IN [place]   [facility] IMPEDANCE STUDY N/A 05/30/2017   Procedure: [place] IMPEDANCE STUDY;  Surgeon: Lucilla Lame, MD;  Location: [facility] ENDOSCOPY;  Service: Endoscopy;  Laterality: N/A;   POLYPECTOMY N/A 01/26/2017   Procedure: POLYPECTOMY;  Surgeon: Lucilla Lame, MD;  Location: [place];  Service: Endoscopy;  Laterality: N/A;   POSTERIOR CERVICAL FUSION/FORAMINOTOMY N/A 04/16/2013   Procedure: REMOVAL CERVICAL PLATES AND INTERBODY CAGE/POSTERIOR CERVICAL SPINAL FUSION C4 - C6/C5 CORPECTOMY/C4 - C6 FUSION WITH ILIAC CREST BONE GRAFT;  Surgeon: Melina Schools, MD;  Location: [place];  Service: Orthopedics;  Laterality: N/A;   RADIOFREQUENCY ABLATION NERVES     REDUCTION MAMMAPLASTY Bilateral 05/2016   RIGHT HIP ARTHROSCOPY FOR LABRAL TEAR  ABOUT 2010   2012 also   SHOULDER ARTHROSCOPY  05-06-12   bone spur   TEE WITHOUT CARDIOVERSION N/A 08/05/2020   Procedure: TRANSESOPHAGEAL ECHOCARDIOGRAM (TEE);  Surgeon: Lelon Perla, MD;  Location: [facility] ENDOSCOPY;  Service: Cardiovascular;  Laterality: N/A;   TONSILLECTOMY     AS A CHILD   TOTAL HIP ARTHROPLASTY Right 05/13/2012   Procedure: TOTAL HIP ARTHROPLASTY ANTERIOR APPROACH;  Surgeon: Gearlean Alf, MD;  [facility];  Service: Orthopedics;  Laterality: Right;    Social History Social History   Tobacco Use   Smoking status: Never   Smokeless tobacco: Never  Vaping Use   Vaping Use: Never used  Substance Use Topics   Alcohol use: No    Alcohol/week: 0.0 standard drinks of alcohol   Drug use: Yes    Comment: prescribed morphine and xanax    Family History Family History  Problem Relation Age of Onset   Aneurysm Mother    Bipolar disorder Sister    Aneurysm Maternal Grandmother    Breast cancer Paternal Grandmother 37   Bipolar disorder Grandchild    Anxiety  disorder Grandchild    Depression Grandchild    Cancer Neg Hx     Allergies  Allergen Reactions   Amoxicillin Hives    She did ok w ANCEF   Chlorhexidine Gluconate Dermatitis and Hives   Clindamycin Hives   Codeine Hives   Erythromycin Hives    "mycins"  in general   Penicillin G Hives    "cillins" in general   Sulfa Antibiotics Nausea And Vomiting and Hives        Levofloxacin Hives   Shellfish Allergy Hives   Decadron [Dexamethasone] Other (See Comments)    Hot flashes, insomnia, "manic" Hot flashes, insomnia, "manic"   Mangifera Indica Hives    papaya   Papaya Derivatives Hives   Betadine [Povidone Iodine] Hives        Clarithromycin Hives   Other Hives     Mango    Povidone-Iodine Hives   Prednisone Anxiety    High blood pressure, flushed, mood changes, heart palpitations High blood pressure, flushed, mood changes, heart palpitations     REVIEW OF SYSTEMS (Negative unless checked)  Constitutional: '[]'$ Weight loss  '[]'$ Fever  '[]'$ Chills Cardiac: '[]'$ Chest pain   '[]'$ Chest pressure   '[]'$ Palpitations   '[]'$ Shortness of breath when laying flat   '[]'$ Shortness of breath with exertion. Vascular:  '[]'$ Pain in legs with walking   '[]'$ Pain in legs at rest  '[]'$ History of DVT   '[]'$ Phlebitis   '[]'$ Swelling in legs   '[]'$ Varicose veins   '[]'$ Non-healing ulcers Pulmonary:   '[]'$ Uses home oxygen   '[]'$ Productive cough   '[]'$ Hemoptysis   '[]'$ Wheeze  '[]'$ COPD   '[]'$ Asthma Neurologic:  '[]'$ Dizziness   '[]'$ Seizures   '[]'$ History of stroke   '[]'$ History of TIA  '[]'$ Aphasia   '[]'$ Vissual changes   '[]'$ Weakness or numbness in arm   '[]'$ Weakness or numbness in leg Musculoskeletal:   '[]'$ Joint swelling   '[]'$ Joint pain   '[]'$ Low back pain Hematologic:  '[]'$ Easy bruising  '[]'$ Easy bleeding   '[]'$ Hypercoagulable state   '[]'$ Anemic Gastrointestinal:  '[]'$ Diarrhea   '[]'$ Vomiting  '[x]'$ Gastroesophageal reflux/heartburn   '[]'$ Difficulty swallowing. Genitourinary:  '[]'$ Chronic kidney disease   '[]'$ Difficult urination  '[]'$ Frequent urination   '[]'$ Blood in urine Skin:  '[]'$ Rashes    '[]'$ Ulcers  Psychological:  '[]'$ History of anxiety   '[]'$  History of major depression.  Physical Examination  Vitals:   01/04/22 0355 01/04/22 0738 01/04/22 1105 01/04/22 1545  BP: 106/63 126/76 116/71 106/73  Pulse: (!) 57 66 63 60  Resp: '20 19 19 19  '$ Temp: 98.1 F (36.7 C) 98.1 F (36.7 C) 98 F (36.7 C) 98.3 F (36.8 C)  TempSrc: Oral  Esophageal Oral  SpO2: 100% 100% 100% 100%  Weight:      Height:       Body mass index is 27.06 kg/m. Gen: WD/WN, NAD Head: [place]/AT, No temporalis wasting.  Ear/Nose/Throat: Hearing grossly intact, nares w/o erythema or drainage Eyes: PER, EOMI, sclera nonicteric.  Neck: Supple, no masses.  No bruit or JVD.  Pulmonary:  Good air movement, no audible wheezing, no use of accessory muscles.  Cardiac: RRR, normal S1, S2, no Murmurs. Vascular: Left groin is soft there is a soft fullness at the level of the ileal inguinal crease.  There is no hardness that would suggest a pseudoaneurysm.  There is no pulsatility. Vessel Right Left  Radial Palpable Palpable  PT   Palpable  DP   Palpable  Gastrointestinal: soft, non-distended. No guarding/no peritoneal signs.  Musculoskeletal: M/S 5/5 throughout.  No visible deformity.  Neurologic: CN 2-12 intact. Pain and light touch intact in extremities.  Symmetrical.  Speech is fluent. Motor exam as listed above. Psychiatric: Judgment intact, Mood & affect appropriate for pt's clinical situation. Dermatologic: No rashes or ulcers noted.  No changes consistent with cellulitis.   CBC Lab Results  Component Value Date   WBC 5.9 01/04/2022  HGB 12.2 01/04/2022   HCT 35.8 (L) 01/04/2022   MCV 85.9 01/04/2022   PLT 283 01/04/2022    BMET    Component Value Date/Time   NA 139 01/04/2022 0553   NA 140 01/19/2021 1055   NA 140 02/26/2013 1315   K 4.0 01/04/2022 0553   K 4.0 02/26/2013 1315   CL 108 01/04/2022 0553   CL 108 (H) 02/26/2013 1315   CO2 24 01/04/2022 0553   CO2 27 02/26/2013 1315   GLUCOSE 97  01/04/2022 0553   GLUCOSE 95 02/26/2013 1315   BUN 11 01/04/2022 0553   BUN 7 (L) 01/19/2021 1055   BUN 13 02/26/2013 1315   CREATININE 0.80 01/04/2022 0553   CREATININE 0.85 12/07/2016 1228   CALCIUM 8.7 (L) 01/04/2022 0553   CALCIUM 9.2 02/26/2013 1315   GFRNONAA >60 01/04/2022 0553   GFRNONAA >60 02/26/2013 1315   GFRAA >60 11/26/2019 1213   GFRAA >60 02/26/2013 1315   Estimated Creatinine Clearance: 70.7 mL/min (by C-G formula based on SCr of 0.8 mg/dL).  COAG Lab Results  Component Value Date   INR 1.2 01/03/2022   INR 1.6 (H) 08/05/2021   INR 1.2 02/20/2020    Radiology [place] Lower Ext Art Left Ltd  Result Date: 01/03/2022 CLINICAL DATA:  Tenderness in the left groin 3 months after cardiac ablation. EXAM: Left LOWER EXTREMITY ARTERIAL DUPLEX SCAN TECHNIQUE: Gray-scale sonography as well as color Doppler and duplex ultrasound of the left groin was performed. COMPARISON:  CT abdomen and pelvis 07/28/2021 FINDINGS: Left lower Extremity Focused examination was obtained of the left groin to evaluate the area of tenderness. Grayscale images, color flow Doppler images, and spectral Doppler imaging was obtained. Corresponding to the area of tenderness, there is a 3.7 x 2.6 x 3.9 cm diameter vascular collection containing flow on color flow Doppler imaging. A communication is demonstrated between this structure and the femoral artery and vein. The lesion decreases but remains present with compression. No mural thrombus is identified. IMPRESSION: Left groin pseudoaneurysm with direct communication to the left common femoral artery and vein. Electronically Signed   By: Lucienne Capers M.D.   On: 01/03/2022 20:00   DG HIP UNILAT W OR W/O PELVIS 2-3 VIEWS RIGHT  Result Date: 12/16/2021 CLINICAL DATA:  Right hip pain. EXAM: DG HIP (WITH OR WITHOUT PELVIS) 2-3V RIGHT COMPARISON:  May 13, 2012. FINDINGS: Status post right total hip arthroplasty. The right femoral and acetabular components are  well situated. No fracture or dislocation is noted. IMPRESSION: No acute abnormality seen. Electronically Signed   By: Marijo Conception M.D.   On: 12/16/2021 11:35   DG Lumbar Spine Complete W/Bend  Result Date: 12/16/2021 CLINICAL DATA:  LOWER back pain. EXAM: LUMBAR SPINE - COMPLETE WITH BENDING VIEWS COMPARISON:  05/20/2018 FINDINGS: Prior posterior fusion of T10 through L4. Normal alignment. No translocation with flexion or extension. No lytic or blastic lesions. No hardware failure or acute fracture. Bowel gas pattern is nonobstructive. Surgical clips are present in the abdomen. Prior RIGHT hip arthroplasty. IMPRESSION: Prior posterior fusion. No evidence for instability. Electronically Signed   By: Nolon Nations M.D.   On: 12/16/2021 10:51     Assessment/Plan 1.  Left groin pseudoaneurysm: The patient's history and clinical evaluation are not suggestive of a pseudoaneurysm although the duplex ultrasound does appear to document this problem.  Given this discrepancy of it discussed with the patient obtaining a CT angiogram to better characterize the left groin.  I have  also discussed with her that the treatment for the pseudoaneurysm if confirmed would certainly include a left lower extremity angiogram.  We have agreed we will proceed with a CT scan to evaluate her left groin more thoroughly and then base further decision-making on this finding.  2.  Atrial fibrillation: Continue antiarrhythmia medications as already ordered, these medications have been reviewed and there are no changes at this time.  Patient is currently on a heparin drip and her Eliquis has been held as is appropriate if indeed angiography is needed.  3.  Asthma: Continue pulmonary medications and aerosols as already ordered, these medications have been reviewed and there are no changes at this time.      4.  GERD: Continue PPI as already ordered, this medication has been reviewed and there are no changes at this  time.  Avoidence of caffeine and alcohol  Moderate elevation of the head of the bed     Hortencia Pilar, MD  01/04/2022 6:05 PM

## 2022-01-04 NOTE — Progress Notes (Signed)
  Transition of Care [facility]) Screening Note   Patient Details  Name: [name] Date of Birth: Jul 14, 1960   Transition of Care [facility]) CM/SW Contact:    Alberteen Sam, LCSW Phone Number: 01/04/2022, 8:24 AM    Transition of Care Department [facility] has reviewed patient and no TOC needs have been identified at this time. We will continue to monitor patient advancement through interdisciplinary progression rounds. If new patient transition needs arise, please place a TOC consult.  [place], [place]

## 2022-01-05 DIAGNOSIS — I724 Aneurysm of artery of lower extremity: Secondary | ICD-10-CM | POA: Diagnosis not present

## 2022-01-05 DIAGNOSIS — I480 Paroxysmal atrial fibrillation: Secondary | ICD-10-CM | POA: Diagnosis not present

## 2022-01-05 DIAGNOSIS — M792 Neuralgia and neuritis, unspecified: Secondary | ICD-10-CM | POA: Diagnosis not present

## 2022-01-05 DIAGNOSIS — K8689 Other specified diseases of pancreas: Secondary | ICD-10-CM | POA: Diagnosis not present

## 2022-01-05 LAB — CBC
HCT: 38 % (ref 36.0–46.0)
Hemoglobin: 12.9 g/dL (ref 12.0–15.0)
MCH: 29 pg (ref 26.0–34.0)
MCHC: 33.9 g/dL (ref 30.0–36.0)
MCV: 85.4 fL (ref 80.0–100.0)
Platelets: 309 10*3/uL (ref 150–400)
RBC: 4.45 MIL/uL (ref 3.87–5.11)
RDW: 12.5 % (ref 11.5–15.5)
WBC: 6.7 10*3/uL (ref 4.0–10.5)
nRBC: 0 % (ref 0.0–0.2)

## 2022-01-05 LAB — APTT
aPTT: 149 seconds — ABNORMAL HIGH (ref 24–36)
aPTT: 76 seconds — ABNORMAL HIGH (ref 24–36)
aPTT: 66 seconds — ABNORMAL HIGH (ref 24–36)

## 2022-01-05 LAB — HEPARIN LEVEL (UNFRACTIONATED): Heparin Unfractionated: 0.97 IU/mL — ABNORMAL HIGH (ref 0.30–0.70)

## 2022-01-05 MED ORDER — HEPARIN (PORCINE) 25000 UT/250ML-% IV SOLN
950.0000 [IU]/h | INTRAVENOUS | Status: DC
  Administered 2022-01-05: 950 [IU]/h via INTRAVENOUS
  Filled 2022-01-05: qty 250

## 2022-01-05 NOTE — Consult Note (Signed)
ANTICOAGULATION CONSULT NOTE  Pharmacy Consult for Heparin Infusion Indication: atrial fibrillation  Patient Measurements: Height: '5\' 4"'$  (162.6 cm) Weight: 71.5 kg (157 lb 10.1 oz) IBW/kg (Calculated) : 54.7 Heparin Dosing Weight: 69.3 kg  Labs: Recent Labs    01/03/22 2100 01/04/22 0553 01/04/22 1327 01/04/22 2036 01/05/22 0619 01/05/22 0713  HGB 14.0 12.2  --   --  12.9  --   HCT 41.8 35.8*  --   --  38.0  --   PLT 350 283  --   --  309  --   APTT  --   --  35 65*  --  149*  LABPROT 14.8  --   --   --   --   --   INR 1.2  --   --   --   --   --   HEPARINUNFRC  --   --  0.89*  --   --  0.97*  CREATININE 0.89 0.80  --   --   --   --      Estimated Creatinine Clearance: 70.7 mL/min (by C-G formula based on SCr of 0.8 mg/dL).   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    Chronic headaches    Chronic, continuous use of opioids    DDD (degenerative disc disease)    CERVICAL AND LUMBAR-CHRONIC PAIN, RT HIP LABRAL TEAR   Depression    PT STATES A LOT OF STRESS IN HER LIFE   Dissociative disorder    Dizziness 04/22/2013   Duodenal ulcer with hemorrhage and perforation ([place]) 04/27/2003   Foot drop, right    FROM BACK SURGERY   GERD (gastroesophageal reflux disease)    H/O arthrodesis 08/18/2014   Headache(784.0)    AND NECK PAIN--STATES RECENT TEST SHOW CERVICAL DEGENERATION   History of blood transfusion    s/p back surgery   History of cardiac cath    a. 05/2019 Cath: Nl cors. EF 55-65%.   History of cardioversion    History of cervical spinal surgery 01/04/2015   History of  kidney stones    Hypertension    Inverted T wave    Iron deficiency 02/01/2021   Mitral regurgitation    a. 07/2020 Echo: EF 60-65%, no rwma. Nl RV size/fxn. RVSP 39.90mHg. Mildly to mod dil LA. Mod MR; b. 07/2020 TEE: EF 55-60%. Lambl's excresence. Nl RV size/fxn. Midly dil LA. No LA/LAA thrombus. Mild MR.   Narrowing of intervertebral disc space 08/18/2014   Currently on disability.    Orthostatic hypotension 04/22/2013   Pain    CHRONIC NECK AND BACK PAIN - LIMITED ROM NECK - S/P FUSIONS CERVICAL AND LUMBAR   [place]oxysmal atrial fibrillation ([place] 2021   a. 07/2020 s/p PVI.   Pneumonia    PONV (postoperative nausea and vomiting)    PT GIVES HX OF N&V AND FEVER WITH SURGERIES YEARS AGO--BUT NO PROBLEMS WITH MORE RECENT SURGERIES--STATES NOT MALIGNANT HYPERTHERMIA   Postop Hyponatremia 05/14/2012   Postoperative anemia due to acute blood loss 05/14/2012   PTSD (  post-traumatic stress disorder)    Right foot drop    Right hip arthralgia 08/18/2014   Status post surgery of right, and now needs left.    Sleep apnea 2021   does not have a cpap   Therapeutic opioid-induced constipation (OIC)    Typical atrial flutter ([facility])     Medications:  Patient takes Eliquis 5 mg po BID, per patient last dose was in the morning on 01/03/22.  Assessment: 61 yo female who presented to the ED with a left femoral pseudoaneurysm.  History of Afib.  Pharmacy consulted to convert to heparin infusion while hospitalized.  Baseline INR 1.2, aPTT 35s, HL 0.89.   Daily CBC with H&H and platelets within normal limits.  HL 0.97  Goal of Therapy:  aPTT 66-102 seconds Heparin level 0.3-0.7 units/ml once APTT and heparin level correlate Monitor platelets by anticoagulation protocol: Yes  1025 2036 aPTT 65s, subthera; 1000 un/hr 10/26 0713 aPTT 159s suprathera; 1100 un/hr   Plan:  --Hold heparin drip x 1 hour --Reduce rate  by 150 units/hr and resume at new rate of 950 units/hr --Re-check aPTT 6 hours from  rate change; daily HL until correlation established and then switch over to HL monitoring --Daily CBC per protocol while on IV heparin  Lorin Picket, PharmD 01/05/2022,8:09 AM

## 2022-01-05 NOTE — Consult Note (Signed)
ANTICOAGULATION CONSULT NOTE  Pharmacy Consult for Heparin Infusion Indication: atrial fibrillation  Patient Measurements: Height: '5\' 4"'$  (162.6 cm) Weight: 71.5 kg (157 lb 10.1 oz) IBW/kg (Calculated) : 54.7 Heparin Dosing Weight: 69.3 kg  Labs: Recent Labs    01/03/22 2100 01/04/22 0553 01/04/22 1327 01/04/22 2036 01/05/22 0619 01/05/22 0713 01/05/22 1532 01/05/22 2145  HGB 14.0 12.2  --   --  12.9  --   --   --   HCT 41.8 35.8*  --   --  38.0  --   --   --   PLT 350 283  --   --  309  --   --   --   APTT  --   --  35   < >  --  149* 76* 66*  LABPROT 14.8  --   --   --   --   --   --   --   INR 1.2  --   --   --   --   --   --   --   HEPARINUNFRC  --   --  0.89*  --   --  0.97*  --   --   CREATININE 0.89 0.80  --   --   --   --   --   --    < > = values in this interval not displayed.     Estimated Creatinine Clearance: 70.7 mL/min (by C-G formula based on SCr of 0.8 mg/dL).   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    Chronic headaches    Chronic, continuous use of opioids    DDD (degenerative disc disease)    CERVICAL AND LUMBAR-CHRONIC PAIN, RT HIP LABRAL TEAR   Depression    PT STATES A LOT OF STRESS IN HER LIFE   Dissociative disorder    Dizziness 04/22/2013   Duodenal ulcer with hemorrhage and perforation ([place]) 04/27/2003   Foot drop, right    FROM BACK SURGERY   GERD (gastroesophageal reflux disease)    H/O arthrodesis 08/18/2014   Headache(784.0)    AND NECK PAIN--STATES RECENT TEST SHOW CERVICAL DEGENERATION   History of blood transfusion    s/p back surgery    History of cardiac cath    a. 05/2019 Cath: Nl cors. EF 55-65%.   History of cardioversion    History of cervical spinal surgery 01/04/2015   History of kidney stones    Hypertension    Inverted T wave    Iron deficiency 02/01/2021   Mitral regurgitation    a. 07/2020 Echo: EF 60-65%, no rwma. Nl RV size/fxn. RVSP 39.16mHg. Mildly to mod dil LA. Mod MR; b. 07/2020 TEE: EF 55-60%. Lambl's excresence. Nl RV size/fxn. Midly dil LA. No LA/LAA thrombus. Mild MR.   Narrowing of intervertebral disc space 08/18/2014   Currently on disability.    Orthostatic hypotension 04/22/2013   Pain    CHRONIC NECK AND BACK PAIN - LIMITED ROM NECK - S/P FUSIONS CERVICAL AND LUMBAR   Paroxysmal  atrial fibrillation ([place]) 2021   a. 07/2020 s/p PVI.   Pneumonia    PONV (postoperative nausea and vomiting)    PT GIVES HX OF N&V AND FEVER WITH SURGERIES YEARS AGO--BUT NO PROBLEMS WITH MORE RECENT SURGERIES--STATES NOT MALIGNANT HYPERTHERMIA   Postop Hyponatremia 05/14/2012   Postoperative anemia due to acute blood loss 05/14/2012   PTSD (post-traumatic stress disorder)    Right foot drop    Right hip arthralgia 08/18/2014   Status post surgery of right, and now needs left.    Sleep apnea 2021   does not have a cpap   Therapeutic opioid-induced constipation (OIC)    Typical atrial flutter (HCC)     Medications:  Patient takes Eliquis 5 mg po BID, per patient last dose was in the morning on 01/03/22.  Assessment: 61 yo female who presented to the ED with a left femoral pseudoaneurysm.  History of Afib.  Pharmacy consulted to convert to heparin infusion while hospitalized.  Baseline INR 1.2, aPTT 35s, HL 0.89.   Daily CBC with H&H and platelets within normal limits.  HL 0.97  Goal of Therapy:  aPTT 66-102 seconds Heparin level 0.3-0.7 units/ml once APTT and heparin level correlate Monitor platelets by anticoagulation protocol: Yes  Date/Time aPTT/HL Comments 1025 2036  aPTT 65s Subthera; 1000  un/hr 10/26 0713  aPTT 159s  Suprathera; 1100 un/hr 10/26 1532 aPTT 76s Therapeutic x 1  950 un/hr 10/26 2145 aPTT 66s Therapeutic x 2 950 un/hr   Plan:  Continue heparin infusion at current rate of 950 un/hr Re-check aPTT and HL in AM Daily HL until correlation established and then switch over to HL monitoring Daily CBC per protocol while on IV heparin  Benita Gutter  01/05/2022 10:18 PM

## 2022-01-05 NOTE — Consult Note (Signed)
ANTICOAGULATION CONSULT NOTE  Pharmacy Consult for Heparin Infusion Indication: atrial fibrillation  Patient Measurements: Height: '5\' 4"'$  (162.6 cm) Weight: 71.5 kg (157 lb 10.1 oz) IBW/kg (Calculated) : 54.7 Heparin Dosing Weight: 69.3 kg  Labs: Recent Labs    01/03/22 2100 01/04/22 0553 01/04/22 1327 01/04/22 1327 01/04/22 2036 01/05/22 0619 01/05/22 0713 01/05/22 1532  HGB 14.0 12.2  --   --   --  12.9  --   --   HCT 41.8 35.8*  --   --   --  38.0  --   --   PLT 350 283  --   --   --  309  --   --   APTT  --   --  35   < > 65*  --  149* 76*  LABPROT 14.8  --   --   --   --   --   --   --   INR 1.2  --   --   --   --   --   --   --   HEPARINUNFRC  --   --  0.89*  --   --   --  0.97*  --   CREATININE 0.89 0.80  --   --   --   --   --   --    < > = values in this interval not displayed.     Estimated Creatinine Clearance: 70.7 mL/min (by C-G formula based on SCr of 0.8 mg/dL).   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    Chronic headaches    Chronic, continuous use of opioids    DDD (degenerative disc disease)    CERVICAL AND LUMBAR-CHRONIC PAIN, RT HIP LABRAL TEAR   Depression    PT STATES A LOT OF STRESS IN HER LIFE   Dissociative disorder    Dizziness 04/22/2013   Duodenal ulcer with hemorrhage and perforation ([place]) 04/27/2003   Foot drop, right    FROM BACK SURGERY   GERD (gastroesophageal reflux disease)    H/O arthrodesis 08/18/2014   Headache(784.0)    AND NECK PAIN--STATES RECENT TEST SHOW CERVICAL DEGENERATION   History of blood transfusion    s/p back surgery    History of cardiac cath    a. 05/2019 Cath: Nl cors. EF 55-65%.   History of cardioversion    History of cervical spinal surgery 01/04/2015   History of kidney stones    Hypertension    Inverted T wave    Iron deficiency 02/01/2021   Mitral regurgitation    a. 07/2020 Echo: EF 60-65%, no rwma. Nl RV size/fxn. RVSP 39.83mHg. Mildly to mod dil LA. Mod MR; b. 07/2020 TEE: EF 55-60%. Lambl's excresence. Nl RV size/fxn. Midly dil LA. No LA/LAA thrombus. Mild MR.   Narrowing of intervertebral disc space 08/18/2014   Currently on disability.    Orthostatic hypotension 04/22/2013   Pain    CHRONIC NECK AND BACK PAIN - LIMITED ROM NECK - S/P FUSIONS CERVICAL AND LUMBAR   Paroxysmal  atrial fibrillation ([place]) 2021   a. 07/2020 s/p PVI.   Pneumonia    PONV (postoperative nausea and vomiting)    PT GIVES HX OF N&V AND FEVER WITH SURGERIES YEARS AGO--BUT NO PROBLEMS WITH MORE RECENT SURGERIES--STATES NOT MALIGNANT HYPERTHERMIA   Postop Hyponatremia 05/14/2012   Postoperative anemia due to acute blood loss 05/14/2012   PTSD (post-traumatic stress disorder)    Right foot drop    Right hip arthralgia 08/18/2014   Status post surgery of right, and now needs left.    Sleep apnea 2021   does not have a cpap   Therapeutic opioid-induced constipation (OIC)    Typical atrial flutter (HCC)     Medications:  Patient takes Eliquis 5 mg po BID, per patient last dose was in the morning on 01/03/22.  Assessment: 61 yo female who presented to the ED with a left femoral pseudoaneurysm.  History of Afib.  Pharmacy consulted to convert to heparin infusion while hospitalized.  Baseline INR 1.2, aPTT 35s, HL 0.89.   Daily CBC with H&H and platelets within normal limits.  HL 0.97  Goal of Therapy:  aPTT 66-102 seconds Heparin level 0.3-0.7 units/ml once APTT and heparin level correlate Monitor platelets by anticoagulation protocol: Yes  Date/Time aPTT/HL Comments 1025 2036  aPTT 65s  subthera; 1000  un/hr 10/26 0713  aPTT 159s   suprathera; 1100 un/hr 10/26 1532 aPTT 76s  Therapeutic x 1 @ 950 un/hr   Plan:  Continue heparin at 950 un/hr Re-check aPTT in 6 hours to confirmed rate Daily HL until correlation established and then switch over to HL monitoring Daily CBC per protocol while on IV heparin  Michaeleen Down Rodriguez-Guzman PharmD, BCPS 01/05/2022 4:48 PM

## 2022-01-05 NOTE — Progress Notes (Signed)
  Progress Note   Patient: [name] NLZ:[id number] DOB: Mar 28, 1960 DOA: 01/03/2022     2 DOS: the patient was seen and examined on 01/05/2022     Assessment and Plan: * Pseudoaneurysm of left femoral artery (HCC) 3.5 cm pseudoaneurysm left groin.  Holding Eliquis.  N.p.o. for vascular procedure on Friday.  Atrial fibrillation (HCC) Paroxysmal in nature.  Holding Eliquis.  Heparin drip in the meantime to prevent stroke.  Cardizem CD and amiodarone for rate control.  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  Does have some pressure in her left groin.  Does have some chronic back pain.  Feels okay.  Physical Exam: Vitals:   01/05/22 0000 01/05/22 0337 01/05/22 0739 01/05/22 1150  BP:  124/85 127/76 108/74  Pulse:  (!) 59 64 (!) 57  Resp: '11 17 19 19  '$ Temp:  98.5 F (36.9 C) 98.3 F (36.8 C) 98.3 F (36.8 C)  TempSrc:  Oral    SpO2:  99% 98% 98%  Weight:      Height:       Physical Exam HENT:     Head: Normocephalic.     Mouth/Throat:     Pharynx: No oropharyngeal exudate.  Eyes:     General: Lids are normal.     Conjunctiva/sclera: Conjunctivae normal.  Cardiovascular:     Rate and Rhythm: Normal rate and regular rhythm.     Pulses:          Dorsalis pedis pulses are 1+ on the right side.       Posterior tibial pulses are 1+ on the right side.     Heart sounds: Normal heart sounds, S1 normal and S2 normal.     Comments: Left groin pulsatile area Pulmonary:     Breath sounds: No decreased breath sounds, wheezing, rhonchi or rales.  Abdominal:     Palpations: Abdomen is soft.     Tenderness: There is no abdominal tenderness.  Musculoskeletal:     Right lower leg: No swelling.     Left lower leg: No swelling.  Skin:    General: Skin is warm.     Findings: No rash.  Neurological:     Mental Status: She is alert and oriented to person,  place, and time.     Data Reviewed: Called radiology to have the official CT angio report in epic.  Verbal report is a 3.5 cm pseudoaneurysm.  Disposition: Status is: Inpatient Remains inpatient appropriate because: Will need vascular procedure tomorrow  Planned Discharge Destination: Home    Time spent: 27 minutes  Author: Loletha Grayer, MD 01/05/2022 1:57 PM  For on call review [email or web address].

## 2022-01-06 ENCOUNTER — Encounter
Admission: EM | Disposition: A | Discharge status: Home / Self Care | Payer: Self-pay | Source: Home / Self Care | Attending: Internal Medicine

## 2022-01-06 DIAGNOSIS — I480 Paroxysmal atrial fibrillation: Secondary | ICD-10-CM | POA: Diagnosis not present

## 2022-01-06 DIAGNOSIS — I724 Aneurysm of artery of lower extremity: Secondary | ICD-10-CM | POA: Diagnosis not present

## 2022-01-06 DIAGNOSIS — T81718A Complication of other artery following a procedure, not elsewhere classified, initial encounter: Secondary | ICD-10-CM

## 2022-01-06 DIAGNOSIS — K8689 Other specified diseases of pancreas: Secondary | ICD-10-CM | POA: Diagnosis not present

## 2022-01-06 DIAGNOSIS — M792 Neuralgia and neuritis, unspecified: Secondary | ICD-10-CM | POA: Diagnosis not present

## 2022-01-06 HISTORY — PX: LOWER EXTREMITY ANGIOGRAPHY: CATH118251

## 2022-01-06 LAB — APTT
aPTT: 154 seconds — ABNORMAL HIGH (ref 24–36)
aPTT: 88 seconds — ABNORMAL HIGH (ref 24–36)

## 2022-01-06 LAB — CBC
HCT: 37.8 % (ref 36.0–46.0)
Hemoglobin: 12.5 g/dL (ref 12.0–15.0)
MCH: 28.5 pg (ref 26.0–34.0)
MCHC: 33.1 g/dL (ref 30.0–36.0)
MCV: 86.1 fL (ref 80.0–100.0)
Platelets: 268 10*3/uL (ref 150–400)
RBC: 4.39 MIL/uL (ref 3.87–5.11)
RDW: 12.4 % (ref 11.5–15.5)
WBC: 5.8 10*3/uL (ref 4.0–10.5)
nRBC: 0 % (ref 0.0–0.2)

## 2022-01-06 LAB — HEPARIN LEVEL (UNFRACTIONATED)
Heparin Unfractionated: 0.53 IU/mL (ref 0.30–0.70)
Heparin Unfractionated: 0.57 IU/mL (ref 0.30–0.70)

## 2022-01-06 SURGERY — LOWER EXTREMITY ANGIOGRAPHY
Anesthesia: Moderate Sedation | Laterality: Left

## 2022-01-06 MED ORDER — FENTANYL CITRATE (PF) 100 MCG/2ML IJ SOLN
INTRAMUSCULAR | Status: DC | PRN
  Administered 2022-01-06: 50 ug via INTRAVENOUS
  Administered 2022-01-06: 25 ug via INTRAVENOUS
  Administered 2022-01-06: 50 ug via INTRAVENOUS

## 2022-01-06 MED ORDER — FENTANYL CITRATE (PF) 100 MCG/2ML IJ SOLN
INTRAMUSCULAR | Status: AC
  Filled 2022-01-06: qty 2

## 2022-01-06 MED ORDER — VANCOMYCIN HCL IN DEXTROSE 1-5 GM/200ML-% IV SOLN
INTRAVENOUS | Status: AC
  Administered 2022-01-06: 1000 mg via INTRAVENOUS
  Filled 2022-01-06: qty 200

## 2022-01-06 MED ORDER — DIPHENHYDRAMINE HCL 50 MG/ML IJ SOLN: 50.0000 mg | Freq: Once | INTRAMUSCULAR | Status: AC | PRN

## 2022-01-06 MED ORDER — ACETAMINOPHEN 325 MG PO TABS: 650.0000 mg | ORAL_TABLET | ORAL | Status: DC | PRN

## 2022-01-06 MED ORDER — HEPARIN (PORCINE) 25000 UT/250ML-% IV SOLN
950.0000 [IU]/h | INTRAVENOUS | Status: DC
  Administered 2022-01-06: 950 [IU]/h via INTRAVENOUS
  Filled 2022-01-06: qty 250

## 2022-01-06 MED ORDER — SODIUM CHLORIDE FLUSH 0.9 % IV SOLN
INTRAVENOUS | Status: AC
  Filled 2022-01-06: qty 10

## 2022-01-06 MED ORDER — ONDANSETRON HCL 4 MG/2ML IJ SOLN: 4.0000 mg | Freq: Four times a day (QID) | INTRAMUSCULAR | Status: DC | PRN

## 2022-01-06 MED ORDER — SODIUM CHLORIDE 0.9 % IV SOLN: INTRAVENOUS | Status: AC

## 2022-01-06 MED ORDER — OXYCODONE HCL 5 MG PO TABS
5.0000 mg | ORAL_TABLET | ORAL | Status: DC | PRN
  Administered 2022-01-06 – 2022-01-07 (×4): 10 mg via ORAL
  Filled 2022-01-06 (×4): qty 2

## 2022-01-06 MED ORDER — DIPHENHYDRAMINE HCL 50 MG/ML IJ SOLN
INTRAMUSCULAR | Status: AC
  Administered 2022-01-06: 50 mg via INTRAVENOUS
  Filled 2022-01-06: qty 1

## 2022-01-06 MED ORDER — SODIUM CHLORIDE 0.9 % IV SOLN: INTRAVENOUS | Status: DC

## 2022-01-06 MED ORDER — HYDRALAZINE HCL 20 MG/ML IJ SOLN: 5.0000 mg | INTRAMUSCULAR | Status: DC | PRN

## 2022-01-06 MED ORDER — FAMOTIDINE 20 MG PO TABS: 40.0000 mg | ORAL_TABLET | Freq: Once | ORAL | Status: AC | PRN

## 2022-01-06 MED ORDER — HEPARIN SODIUM (PORCINE) 1000 UNIT/ML IJ SOLN
INTRAMUSCULAR | Status: AC
  Filled 2022-01-06: qty 10

## 2022-01-06 MED ORDER — FAMOTIDINE 20 MG PO TABS
ORAL_TABLET | ORAL | Status: AC
  Administered 2022-01-06: 40 mg via ORAL
  Filled 2022-01-06: qty 2

## 2022-01-06 MED ORDER — SODIUM CHLORIDE 0.9% FLUSH: 3.0000 mL | INTRAVENOUS | Status: DC | PRN

## 2022-01-06 MED ORDER — MIDAZOLAM HCL 5 MG/5ML IJ SOLN
INTRAMUSCULAR | Status: AC
  Filled 2022-01-06: qty 5

## 2022-01-06 MED ORDER — LABETALOL HCL 5 MG/ML IV SOLN: 10.0000 mg | INTRAVENOUS | Status: DC | PRN

## 2022-01-06 MED ORDER — VANCOMYCIN HCL IN DEXTROSE 1-5 GM/200ML-% IV SOLN: 1000.0000 mg | INTRAVENOUS | Status: AC

## 2022-01-06 MED ORDER — SODIUM CHLORIDE 0.9 % IV SOLN
250.0000 mL | INTRAVENOUS | Status: DC | PRN
  Administered 2022-01-07: 250 mL via INTRAVENOUS

## 2022-01-06 MED ORDER — FAMOTIDINE 20 MG PO TABS
ORAL_TABLET | ORAL | Status: AC
  Filled 2022-01-06: qty 2

## 2022-01-06 MED ORDER — HYDROMORPHONE HCL 1 MG/ML IJ SOLN: 1.0000 mg | Freq: Once | INTRAMUSCULAR | Status: DC | PRN

## 2022-01-06 MED ORDER — MIDAZOLAM HCL 2 MG/2ML IJ SOLN
INTRAMUSCULAR | Status: DC | PRN
  Administered 2022-01-06: 1 mg via INTRAVENOUS
  Administered 2022-01-06: .5 mg via INTRAVENOUS
  Administered 2022-01-06: 2 mg via INTRAVENOUS

## 2022-01-06 MED ORDER — HEPARIN SODIUM (PORCINE) 1000 UNIT/ML IJ SOLN
INTRAMUSCULAR | Status: DC | PRN
  Administered 2022-01-06: 2000 [IU] via INTRAVENOUS

## 2022-01-06 MED ORDER — MORPHINE SULFATE (PF) 4 MG/ML IV SOLN: 2.0000 mg | INTRAVENOUS | Status: DC | PRN

## 2022-01-06 MED ORDER — SODIUM CHLORIDE 0.9% FLUSH
3.0000 mL | Freq: Two times a day (BID) | INTRAVENOUS | Status: DC
  Administered 2022-01-06: 3 mL via INTRAVENOUS

## 2022-01-06 SURGICAL SUPPLY — 22 items
CATH ANGIO 5F PIGTAIL 65CM (CATHETERS) IMPLANT
CATH BEACON 5 .035 65 KMP TIP (CATHETERS) IMPLANT
CATH MICROCATH PRGRT 2.8F 110 (CATHETERS) IMPLANT
COIL 400 COMPLEX SOFT 12X40CM (Vascular Products) IMPLANT
COIL 400 COMPLEX SOFT 16X50CM (Vascular Products) IMPLANT
COIL 400 COMPLEX SOFT 20X60CM (Vascular Products) IMPLANT
COIL 400 COMPLEX STD 5X12CM (Vascular Products) IMPLANT
COVER DRAPE FLUORO 36X44 (DRAPES) IMPLANT
COVER PROBE ULTRASOUND 5X96 (MISCELLANEOUS) IMPLANT
DEVICE STARCLOSE SE CLOSURE (Vascular Products) IMPLANT
GLIDEWIRE ADV .035X260CM (WIRE) IMPLANT
HANDLE DETACHMENT COIL (MISCELLANEOUS) IMPLANT
MICROCATH PROGREAT 2.8F 110 CM (CATHETERS) ×1
NDL ENTRY 21GA 7CM ECHOTIP (NEEDLE) IMPLANT
NEEDLE ENTRY 21GA 7CM ECHOTIP (NEEDLE) ×1 IMPLANT
PACK ANGIOGRAPHY (CUSTOM PROCEDURE TRAY) ×1 IMPLANT
SET INTRO CAPELLA COAXIAL (SET/KITS/TRAYS/PACK) IMPLANT
SHEATH ANL 5FRX45 (SHEATH) IMPLANT
SHEATH BRITE TIP 5FRX11 (SHEATH) IMPLANT
SYR MEDRAD MARK 7 150ML (SYRINGE) IMPLANT
TUBING CONTRAST HIGH PRESS 72 (TUBING) IMPLANT
WIRE GUIDERIGHT .035X150 (WIRE) IMPLANT

## 2022-01-06 NOTE — Interval H&P Note (Signed)
History and Physical Interval Note:  01/06/2022 4:06 PM  [name]  has presented today for surgery, with the diagnosis of Pseudoaneurysm.  The various methods of treatment have been discussed with the patient and family. After consideration of risks, benefits and other options for treatment, the patient has consented to  Procedure(s): Lower Extremity Angiography (Left) as a surgical intervention.  The patient's history has been reviewed, patient examined, no change in status, stable for surgery.  I have reviewed the patient's chart and labs.  Questions were answered to the patient's satisfaction.     [name]

## 2022-01-06 NOTE — Consult Note (Signed)
ANTICOAGULATION CONSULT NOTE  Pharmacy Consult for Heparin Infusion Indication: atrial fibrillation  Patient Measurements: Height: '5\' 4"'$  (162.6 cm) Weight: 71.5 kg (157 lb 10.1 oz) IBW/kg (Calculated) : 54.7 Heparin Dosing Weight: 69.3 kg  Labs: Recent Labs    01/03/22 2100 01/04/22 0553 01/04/22 1327 01/04/22 2036 01/05/22 0619 01/05/22 0713 01/05/22 1532 01/05/22 2145 01/06/22 0436  HGB 14.0 12.2  --   --  12.9  --   --   --  12.5  HCT 41.8 35.8*  --   --  38.0  --   --   --  37.8  PLT 350 283  --   --  309  --   --   --  268  APTT  --   --  35   < >  --  149* 76* 66* 154*  LABPROT 14.8  --   --   --   --   --   --   --   --   INR 1.2  --   --   --   --   --   --   --   --   HEPARINUNFRC  --   --  0.89*  --   --  0.97*  --   --  0.53  CREATININE 0.89 0.80  --   --   --   --   --   --   --    < > = values in this interval not displayed.     Estimated Creatinine Clearance: 70.7 mL/min (by C-G formula based on SCr of 0.8 mg/dL).   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    Chronic headaches    Chronic, continuous use of opioids    DDD (degenerative disc disease)    CERVICAL AND LUMBAR-CHRONIC PAIN, RT HIP LABRAL TEAR   Depression    PT STATES A LOT OF STRESS IN HER LIFE   Dissociative disorder    Dizziness 04/22/2013   Duodenal ulcer with hemorrhage and perforation ([place]) 04/27/2003   Foot drop, right    FROM BACK SURGERY   GERD (gastroesophageal reflux disease)    H/O arthrodesis 08/18/2014   Headache(784.0)    AND NECK PAIN--STATES RECENT TEST SHOW CERVICAL DEGENERATION    History of blood transfusion    s/p back surgery   History of cardiac cath    a. 05/2019 Cath: Nl cors. EF 55-65%.   History of cardioversion    History of cervical spinal surgery 01/04/2015   History of kidney stones    Hypertension    Inverted T wave    Iron deficiency 02/01/2021   Mitral regurgitation    a. 07/2020 Echo: EF 60-65%, no rwma. Nl RV size/fxn. RVSP 39.53mHg. Mildly to mod dil LA. Mod MR; b. 07/2020 TEE: EF 55-60%. Lambl's excresence. Nl RV size/fxn. Midly dil LA. No LA/LAA thrombus. Mild MR.   Narrowing of intervertebral disc space 08/18/2014   Currently on disability.    Orthostatic hypotension 04/22/2013   Pain    CHRONIC NECK  AND BACK PAIN - LIMITED ROM NECK - S/P FUSIONS CERVICAL AND LUMBAR   Paroxysmal atrial fibrillation ([place]) 2021   a. 07/2020 s/p PVI.   Pneumonia    PONV (postoperative nausea and vomiting)    PT GIVES HX OF N&V AND FEVER WITH SURGERIES YEARS AGO--BUT NO PROBLEMS WITH MORE RECENT SURGERIES--STATES NOT MALIGNANT HYPERTHERMIA   Postop Hyponatremia 05/14/2012   Postoperative anemia due to acute blood loss 05/14/2012   PTSD (post-traumatic stress disorder)    Right foot drop    Right hip arthralgia 08/18/2014   Status post surgery of right, and now needs left.    Sleep apnea 2021   does not have a cpap   Therapeutic opioid-induced constipation (OIC)    Typical atrial flutter (HCC)     Medications:  Patient takes Eliquis 5 mg po BID, per patient last dose was in the morning on 01/03/22.  Assessment: 61 yo female who presented to the ED with a left femoral pseudoaneurysm.  History of Afib.  Pharmacy consulted to convert to heparin infusion while hospitalized.  Baseline INR 1.2, aPTT 35s, HL 0.89.   Daily CBC with H&H and platelets within normal limits.  HL 0.97  Goal of Therapy:  aPTT 66-102 seconds Heparin level 0.3-0.7 units/ml once APTT and heparin level correlate Monitor platelets by anticoagulation protocol:  Yes  Date/Time aPTT/HL Comments 1025 2036  aPTT 65s Subthera; 1000 un/hr 10/26 0713  aPTT 159s  Suprathera; 1100 un/hr 10/26 1532 aPTT 76s Therapeutic x 1  950 un/hr 10/26 2145 aPTT 66s Therapeutic x 2 950 un/hr 10/27 0436 154s / 0.53 aPTT Suprathera, HL thera   Plan:  Continue heparin infusion at current rate of 950 un/hr Will re-check aPTT and HL in 6 hours as aPTT seems to be erroneous reading. Patient's last Eliquis dose was >72 hours ago and HL has normalized. Daily CBC per protocol while on IV heparin  Pearla Dubonnet  01/06/2022 5:28 AM

## 2022-01-06 NOTE — Progress Notes (Signed)
  Progress Note   Patient: [name] PJS:[id number] DOB: [birth year] DOA: 01/03/2022     3 DOS: the patient was seen and examined on 01/06/2022    Assessment and Plan: * Pseudoaneurysm of left femoral artery (HCC) 3.5 cm pseudoaneurysm left groin.  Holding Eliquis.  N.p.o. for vascular procedure today.  Can resume diet after procedure.  Atrial fibrillation (HCC) Paroxysmal in nature.  Holding Eliquis.  Heparin drip in the meantime to prevent stroke.  Cardizem CD and amiodarone for rate control.  Neurogenic pain On chronic pain meds  Pancreatic insufficiency Continue Creon with meals.  Chronic GERD On Protonix        Subjective: Patient has felt some sharp pains in her left groin yesterday and today.  Admitted with pseudoaneurysm.  N.p.o. for vascular surgery procedure today.  Physical Exam: Vitals:   01/06/22 0139 01/06/22 0453 01/06/22 0746 01/06/22 1110  BP: (!) 116/59 109/67 122/73 117/76  Pulse: (!) 57 (!) 57 (!) 58 (!) 55  Resp: '18 20 16 16  '$ Temp: 98.2 F (36.8 C) 97.8 F (36.6 C) 98.1 F (36.7 C) 98.3 F (36.8 C)  TempSrc: Oral   Oral  SpO2: 98% 98% 98% 99%  Weight:      Height:       Physical Exam HENT:     Head: Normocephalic.     Mouth/Throat:     Pharynx: No oropharyngeal exudate.  Eyes:     General: Lids are normal.     Conjunctiva/sclera: Conjunctivae normal.  Cardiovascular:     Rate and Rhythm: Normal rate and regular rhythm.     Pulses:          Dorsalis pedis pulses are 1+ on the right side.       Posterior tibial pulses are 1+ on the right side.     Heart sounds: Normal heart sounds, S1 normal and S2 normal.     Comments: Left groin pulsatile area Pulmonary:     Breath sounds: No decreased breath sounds, wheezing, rhonchi or rales.  Abdominal:     Palpations: Abdomen is soft.     Tenderness: There is no abdominal tenderness.  Musculoskeletal:     Right lower leg: No swelling.     Left lower leg: No swelling.  Skin:    General:  Skin is warm.     Findings: No rash.  Neurological:     Mental Status: She is alert and oriented to person, place, and time.     Data Reviewed: Last PTT 88   Disposition: Status is: Inpatient Remains inpatient appropriate because: For vascular surgery procedure today for pseudoaneurysm left groin  Planned Discharge Destination: Home    Time spent: 28 minutes  Author: Loletha Grayer, MD 01/06/2022 1:01 PM  For on call review [email or web address].

## 2022-01-06 NOTE — Op Note (Signed)
[name]  Percutaneous Study/Intervention Procedural Note   Date of Surgery: 01/06/2022,5:26 PM  Surgeon:[name], [name]   Pre-operative Diagnosis: Left SFA pseudoaneurysm status post ablation for atrial fibrillation  Post-operative diagnosis:  Same  Procedure(s) Performed:  1.  Introduction catheter into branch of the SFA third order catheter placement  2.  Coil embolization for hemorrhage/repair pseudoaneurysm  3.  Ultrasound guidance to access right common femoral artery  4.  StarClose right common femoral artery   Anesthesia: Conscious sedation was administered by the interventional radiology RN under my direct supervision. IV Versed plus fentanyl were utilized. Continuous ECG, pulse oximetry and blood pressure was monitored throughout the entire procedure. Conscious sedation was administered for a total of 40 minutes and 25 seconds.  Sheath: 5 [place] Ansell right common femoral retrograde  Contrast: 35 cc   Fluoroscopy Time: 10.3 minutes  Indications: Patient presented to the hospital with a pseudoaneurysm.  CT confirmed this.  She is now undergoing angiography with hope for intervention to treat her pseudoaneurysm  Procedure:  [name] a 61 y.o. female who was identified and appropriate procedural time out was performed.  The patient was then placed supine on the table and prepped and draped in the usual sterile fashion.  Ultrasound was used to evaluate the right common femoral artery.  It was echolucent and pulsatile indicating it is patent .  An ultrasound image was acquired for the permanent record.  A micropuncture needle was used to access the right common femoral artery under direct ultrasound guidance.  The microwire was then advanced under fluoroscopic guidance without difficulty followed by the micro-sheath.  A 0.035 J wire was advanced without resistance and a 5Fr sheath was placed.    Pigtail catheter was advanced to just above  the aortic bifurcation and a advantage wire was used to cross the aortic bifurcation with the pigtail catheter.  Catheter was negotiated down into the SFA followed by the wire.  Pigtail catheter and sheath were removed and a 5 [place] Ansell sheath was advanced up and over the aortic bifurcation and positioned in the distal common femoral artery on the left.  Hand-injection contrast demonstrated the branch from which the pseudoaneurysm emanated.  Measurements were made and the pseudoaneurysm was 25 mm x 38 mm.  Using a Kumpe catheter and the advantage wire I selected the branch and then subsequently advanced a prograde catheter into the sac itself.  A total of 5 Ruby coils were placed in the sac itself a 20 mm x 60 soft a 60 mm x 50 soft a 16 mm x 50 soft a 60 mm x 50 soft and a 12 mm x 40 soft.  The neck was then treated with a 5 mm x 12 cm standard coil.  Follow-up imaging demonstrated complete absence of flow within the pseudoaneurysm.  At this point I elected to terminate the case.  The sheath was pulled back into the right external iliac oblique view was obtained and a Star close device deployed.  Summary: Successful elimination of the left groin pseudoaneurysm.    Disposition: Patient was taken to the recovery room in stable condition having tolerated the procedure well.  [name] 01/06/2022,5:26 PM

## 2022-01-06 NOTE — Consult Note (Signed)
ANTICOAGULATION CONSULT NOTE  Pharmacy Consult for Heparin Infusion Indication: atrial fibrillation  Patient Measurements: Height: '5\' 4"'$  (162.6 cm) Weight: 71.5 kg (157 lb 10.1 oz) IBW/kg (Calculated) : 54.7 Heparin Dosing Weight: 69.3 kg  Labs: Recent Labs    01/03/22 2100 01/04/22 0553 01/04/22 1327 01/05/22 0619 01/05/22 0713 01/05/22 1532 01/05/22 2145 01/06/22 0436 01/06/22 1058  HGB 14.0 12.2  --  12.9  --   --   --  12.5  --   HCT 41.8 35.8*  --  38.0  --   --   --  37.8  --   PLT 350 283  --  309  --   --   --  268  --   APTT  --   --    < >  --  149*   < > 66* 154* 88*  LABPROT 14.8  --   --   --   --   --   --   --   --   INR 1.2  --   --   --   --   --   --   --   --   HEPARINUNFRC  --   --    < >  --  0.97*  --   --  0.53 0.57  CREATININE 0.89 0.80  --   --   --   --   --   --   --    < > = values in this interval not displayed.     Estimated Creatinine Clearance: 70.7 mL/min (by C-G formula based on SCr of 0.8 mg/dL).   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    Chronic headaches    Chronic, continuous use of opioids    DDD (degenerative disc disease)    CERVICAL AND LUMBAR-CHRONIC PAIN, RT HIP LABRAL TEAR   Depression    PT STATES A LOT OF STRESS IN HER LIFE   Dissociative disorder    Dizziness 04/22/2013   Duodenal ulcer with hemorrhage and perforation ([place]) 04/27/2003   Foot drop, right    FROM BACK SURGERY   GERD (gastroesophageal reflux disease)    H/O arthrodesis 08/18/2014   Headache(784.0)    AND NECK PAIN--STATES RECENT TEST SHOW CERVICAL DEGENERATION    History of blood transfusion    s/p back surgery   History of cardiac cath    a. 05/2019 Cath: Nl cors. EF 55-65%.   History of cardioversion    History of cervical spinal surgery 01/04/2015   History of kidney stones    Hypertension    Inverted T wave    Iron deficiency 02/01/2021   Mitral regurgitation    a. 07/2020 Echo: EF 60-65%, no rwma. Nl RV size/fxn. RVSP 39.36mHg. Mildly to mod dil LA. Mod MR; b. 07/2020 TEE: EF 55-60%. Lambl's excresence. Nl RV size/fxn. Midly dil LA. No LA/LAA thrombus. Mild MR.   Narrowing of intervertebral disc space 08/18/2014   Currently on disability.    Orthostatic hypotension 04/22/2013   Pain  CHRONIC NECK AND BACK PAIN - LIMITED ROM NECK - S/P FUSIONS CERVICAL AND LUMBAR   Paroxysmal atrial fibrillation ([place]) 2021   a. 07/2020 s/p PVI.   Pneumonia    PONV (postoperative nausea and vomiting)    PT GIVES HX OF N&V AND FEVER WITH SURGERIES YEARS AGO--BUT NO PROBLEMS WITH MORE RECENT SURGERIES--STATES NOT MALIGNANT HYPERTHERMIA   Postop Hyponatremia 05/14/2012   Postoperative anemia due to acute blood loss 05/14/2012   PTSD (post-traumatic stress disorder)    Right foot drop    Right hip arthralgia 08/18/2014   Status post surgery of right, and now needs left.    Sleep apnea 2021   does not have a cpap   Therapeutic opioid-induced constipation (OIC)    Typical atrial flutter (HCC)     Medications:  Patient takes Eliquis 5 mg po BID, per patient last dose was in the morning on 01/03/22.  Assessment: 61 yo female who presented to the ED with a left femoral pseudoaneurysm.  History of Afib.  Pharmacy consulted to convert to heparin infusion while hospitalized.  Baseline INR 1.2, aPTT 35s, HL 0.89.   Daily CBC with H&H and platelets within normal limits.    Goal of Therapy:  aPTT 66-102 seconds Heparin level 0.3-0.7 units/ml once APTT and heparin level correlate Monitor platelets by anticoagulation protocol:  Yes  Date/Time aPTT/HL Comments 1025 2036  aPTT 65s Subthera; 1000 un/hr 10/26 0713  aPTT 159s  Suprathera; 1100 un/hr 10/26 1532 aPTT 76s Therapeutic x 1  950 un/hr 10/26 2145 aPTT 66s Therapeutic x 2 950 un/hr 10/27 0436 154s / 0.53 aPTT Suprathera, HL thera 10/27 1058 88s / 0.57  Both aPTT and HL are therapeutic Plan:  Continue heparin infusion at current rate of 950 un/hr Recheck HL tomorrow with AM labs now that levels correlate Daily CBC per protocol while on IV heparin  Lorin Picket, PharmD 01/06/2022 11:36 AM

## 2022-01-06 NOTE — Care Management Important Message (Signed)
Important Message  Patient Details  Name: [name] MRN: [id number] Date of Birth: 1960/10/25   Medicare Important Message Given:  Yes     Dannette Barbara 01/06/2022, 1:35 PM

## 2022-01-06 NOTE — TOC Initial Note (Signed)
Transition of Care [facility]) - Initial/Assessment Note    Patient Details  Name: [name] MRN: [id number] Date of Birth: [birth year]  [facility]) CM/SW Contact:    Candie Chroman, LCSW Phone Number: 01/06/2022, 11:59 AM  Clinical Narrative: Readmission prevention screen complete. CSW met with patient. No supports at bedside. CSW introduced role and explained that discharge planning would be discussed. PCP is Lavon Paganini, MD. Patient drives herself to appointments. [facility] [place]. No issues obtaining medications. No home health or DME use prior to admission. No further concerns. CSW encouraged patient to contact CSW as needed. CSW will continue to follow patient for support and facilitate return home once stable. Husband or son will transport her home at discharge.                 Expected Discharge Plan: Home/Self Care Barriers to Discharge: Continued Medical Work up   Patient Goals and CMS Choice        Expected Discharge Plan and Services Expected Discharge Plan: Home/Self Care     Post Acute Care Choice: NA Living arrangements for the past 2 months: Single Family Home                                      Prior Living Arrangements/Services Living arrangements for the past 2 months: Single Family Home   Patient language and need for interpreter reviewed:: Yes Do you feel safe going back to the place where you live?: Yes      Need for Family Participation in Patient Care: Yes (Comment) Care giver support system in place?: Yes (comment)   Criminal Activity/Legal Involvement Pertinent to Current Situation/Hospitalization: No - Comment as needed  Activities of Daily Living Home Assistive Devices/Equipment: None ADL Screening (condition at time of admission) Patient's cognitive ability adequate to safely complete daily activities?: Yes Is the patient deaf or have difficulty hearing?: No Does the patient have difficulty seeing, even  when wearing glasses/contacts?: No Does the patient have difficulty concentrating, remembering, or making decisions?: No Patient able to express need for assistance with ADLs?: Yes Does the patient have difficulty dressing or bathing?: No Independently performs ADLs?: Yes (appropriate for developmental age) Does the patient have difficulty walking or climbing stairs?: Yes Weakness of Legs: None Weakness of Arms/Hands: None  Permission Sought/Granted                  Emotional Assessment Appearance:: Appears stated age Attitude/Demeanor/Rapport: Engaged Affect (typically observed): Accepting, Appropriate, Calm Orientation: : Oriented to Self, Oriented to Place, Oriented to  Time, Oriented to Situation Alcohol / Substance Use: Not Applicable Psych Involvement: No (comment)  Admission diagnosis:  Pseudoaneurysm of left femoral artery (HCC) [I72.4] Pseudoaneurysm of femoral artery (HCC) [I72.4] Patient Active Problem List   Diagnosis Date Noted   Pancreatic insufficiency 01/04/2022   Pseudoaneurysm of left femoral artery ([place]) 01/03/2022   High risk medication use 12/14/2021   At risk for respiratory depression due to opioid 12/14/2021   Chronic hip pain after total replacement (THR) (Right) 12/14/2021   Cervicalgia 09/10/2021   Nausea & vomiting 08/10/2021   Constipation 08/04/2021   Atrial flutter ([place])    Atrial fibrillation ([place]) 07/30/2021   Diarrhea 07/28/2021   Iron deficiency 02/01/2021   Skin abnormalities 01/18/2021   Epigastric abdominal pain    Ileus ([place]) 01/14/2021   Atrial fibrillation with RVR ([place])  Enteritis    Chest pain    Chronic shoulder pain (Right) 11/30/2020   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   Arthropathy of shoulder (Right) 11/30/2020   C7 radiculopathy (Right) 11/30/2020   Cervical radiculopathy at C8 (Right) 11/30/2020   Muscle weakness of upper extremity (Right) 11/30/2020   Weakness of upper extremity (Right) 11/30/2020    Cervical fusion syndrome 11/30/2020   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   Chronic use of opiate for therapeutic purpose 08/18/2020   Longstanding persistent atrial fibrillation ([place]) 06/30/2020   Abnormal MRI, cervical spine (05/12/2020) 06/24/2020   Chronic upper back pain 06/24/2020   Enthesopathy of shoulder (Left) 05/13/2020   Osteoarthritis of shoulder (Left) 05/13/2020   Symptoms referable to shoulder joint 05/13/2020   Tendinopathy of rotator cuff (Left) 05/13/2020   Sprain of supraspinatus muscle or tendon, sequela (Left) 05/13/2020   Subdeltoid bursitis of shoulder (Left) 05/13/2020   Subacromial bursitis of shoulder (Left) 05/13/2020   Chronic shoulder pain (Left) 05/13/2020   History of allergy to iodine 05/13/2020    Class: History of   Abnormal MRI, shoulder (03/20/2017) 04/21/2020   Chronic anticoagulation (Eliquis) 04/21/2020   Chronic neck pain with history of cervical spinal surgery 04/21/2020   Multiple allergies 04/21/2020   S/P repair of paraesophageal hernia 02/26/2020   Heartburn    Gastritis without bleeding    Typical atrial flutter (HCC)    Foraminal stenosis of cervical region (C3-4) (Right) 08/26/2019   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) 08/26/2019   DDD (degenerative disc disease), cervical 08/25/2019   Cervical radiculitis (C4,C5,C8) (Left) 08/20/2019   Pure hypercholesterolemia 06/27/2019   Constipation due to opioid therapy 06/27/2019   Snoring 06/27/2019   Unstable angina (HCC) 06/10/2019   Chronic musculoskeletal pain 01/14/2019   Hypertension 12/27/2018   Spondylosis without myelopathy or radiculopathy, lumbosacral region 11/12/2018   History of allergy to radiographic contrast media 11/12/2018   History of allergy to shellfish 11/12/2018   DDD (degenerative disc disease), lumbosacral 07/31/2018   Neuropathic pain 07/31/2018   Neurogenic pain 07/31/2018   Vitamin D deficiency 05/28/2018   History of  lumbar surgery 05/28/2018   Groin pain, chronic, left 05/20/2018   Other intervertebral disc degeneration, lumbar region 05/20/2018   Disorder of skeletal system 05/20/2018   Problems influencing health status 05/20/2018   Overweight 12/25/2017   Enthesopathy of hip region (Left) 07/13/2017   Trigger point of shoulder region (Left) 02/26/2017   Pharmacologic therapy    Polyp of sigmoid colon    Problems with swallowing and mastication    Chronic shoulder arthropathy (Left) 01/04/2017   Dysphagia 12/07/2016   Abnormal flushing and sweating 12/07/2016   Long term prescription benzodiazepine use 10/11/2016   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) 10/11/2016   Chronic hip pain (2ry area of Pain) (Bilateral) (L>R) 10/11/2016   Osteoarthritis of shoulder (Bilateral) (L>R) 05/17/2016   Cervicogenic headache 07/06/2015   History of total hip replacement (THR) (Right) 07/05/2015   Chronic shoulder radicular pain (Bilateral) (L>R) 07/05/2015   Lumbar facet syndrome (Bilateral) (L>R) 07/05/2015   Chronic low back pain (1ry area of Pain) (Bilateral) (L>R) w/o sciatica 04/05/2015   Chronic hip pain (Right) 01/04/2015   Encounter for therapeutic drug level monitoring 01/04/2015   Long term current use of opiate analgesic 01/04/2015   Long term prescription opiate use 01/04/2015   Opiate use 01/04/2015   Chronic pain syndrome 01/04/2015   Steroid intolerance 01/04/2015   Cervical spondylosis 01/04/2015  Cervical facet arthropathy (Bilateral) 01/04/2015   History of cervical spinal surgery 01/04/2015   Chronic shoulder pain (3ry area of Pain) (Bilateral) (L>R) 01/04/2015   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   Epidural fibrosis 01/04/2015   Lumbar spondylosis 01/04/2015   Cervical facet syndrome (Bilateral) 01/04/2015   Chronic sacroiliac joint pain (Left) 01/04/2015   DOE (dyspnea on exertion) 09/03/2014   Asthma, mild 08/18/2014   Blurred  vision 08/18/2014   Benign paroxysmal positional nystagmus 08/18/2014   Clinical depression 08/18/2014   Fatigue 08/18/2014   Chronic GERD 08/18/2014   Cannot sleep 08/18/2014   Palpitations 08/18/2014   RAD (reactive airway disease) 08/18/2014   OSA (obstructive sleep apnea) 08/18/2014   Orthostatic hypotension 04/22/2013   Lightheadedness 04/22/2013   Chronic neck pain 04/16/2013   Anxiety state 08/03/2003   Colon, diverticulosis 07/15/2003   IBS (irritable bowel syndrome) 04/27/2003   Esophagitis, reflux 02/11/2003   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

## 2022-01-07 DIAGNOSIS — K8689 Other specified diseases of pancreas: Secondary | ICD-10-CM | POA: Diagnosis not present

## 2022-01-07 DIAGNOSIS — I480 Paroxysmal atrial fibrillation: Secondary | ICD-10-CM | POA: Diagnosis not present

## 2022-01-07 DIAGNOSIS — I724 Aneurysm of artery of lower extremity: Secondary | ICD-10-CM | POA: Diagnosis not present

## 2022-01-07 DIAGNOSIS — K5909 Other constipation: Secondary | ICD-10-CM

## 2022-01-07 LAB — BASIC METABOLIC PANEL
Anion gap: 6 (ref 5–15)
BUN: 12 mg/dL (ref 8–23)
CO2: 24 mmol/L (ref 22–32)
Calcium: 8.1 mg/dL — ABNORMAL LOW (ref 8.9–10.3)
Chloride: 109 mmol/L (ref 98–111)
Creatinine, Ser: 0.82 mg/dL (ref 0.44–1.00)
GFR, Estimated: >60 mL/min (ref >60)
Glucose, Bld: 105 mg/dL — ABNORMAL HIGH (ref 70–99)
Potassium: 3.8 mmol/L (ref 3.5–5.1)
Sodium: 139 mmol/L (ref 135–145)

## 2022-01-07 LAB — CBC
HCT: 37.2 % (ref 36.0–46.0)
HCT: 36.7 % (ref 36.0–46.0)
Hemoglobin: 12.3 g/dL (ref 12.0–15.0)
Hemoglobin: 12.3 g/dL (ref 12.0–15.0)
MCH: 28.4 pg (ref 26.0–34.0)
MCH: 28.5 pg (ref 26.0–34.0)
MCHC: 33.1 g/dL (ref 30.0–36.0)
MCHC: 33.5 g/dL (ref 30.0–36.0)
MCV: 85.9 fL (ref 80.0–100.0)
MCV: 85.2 fL (ref 80.0–100.0)
Platelets: 285 10*3/uL (ref 150–400)
Platelets: 286 10*3/uL (ref 150–400)
RBC: 4.33 MIL/uL (ref 3.87–5.11)
RBC: 4.31 MIL/uL (ref 3.87–5.11)
RDW: 12.6 % (ref 11.5–15.5)
RDW: 12.7 % (ref 11.5–15.5)
WBC: 6.1 10*3/uL (ref 4.0–10.5)
WBC: 6.6 10*3/uL (ref 4.0–10.5)
nRBC: 0 % (ref 0.0–0.2)
nRBC: 0 % (ref 0.0–0.2)

## 2022-01-07 LAB — HEPARIN LEVEL (UNFRACTIONATED): Heparin Unfractionated: 0.41 IU/mL (ref 0.30–0.70)

## 2022-01-07 MED ORDER — OXYCODONE HCL 10 MG PO TABS: 10.0000 mg | ORAL_TABLET | Freq: Four times a day (QID) | ORAL | 0 refills | Status: DC | PRN

## 2022-01-07 MED ORDER — ACETAMINOPHEN 325 MG PO TABS: 650.0000 mg | ORAL_TABLET | Freq: Four times a day (QID) | ORAL | Status: AC | PRN

## 2022-01-07 MED ORDER — SALINE SPRAY 0.65 % NA SOLN: 1.0000 | Freq: Every day | NASAL | 0 refills | Status: DC | PRN

## 2022-01-07 MED ORDER — APIXABAN 5 MG PO TABS
5.0000 mg | ORAL_TABLET | Freq: Two times a day (BID) | ORAL | Status: DC
  Administered 2022-01-07: 5 mg via ORAL
  Filled 2022-01-07: qty 1

## 2022-01-07 NOTE — Discharge Summary (Signed)
Physician Discharge Summary   Patient: [name] MRN: [id number] DOB: May 04, 1960  Admit date:     01/03/2022  Discharge date: 01/07/22  Discharge Physician: Loletha Grayer   PCP: Virginia Crews, MD   Recommendations at discharge:   Follow-up PCP 5 days Follow-up Dr. Delana Meyer 3-weeks  Discharge Diagnoses: Principal Problem:   Pseudoaneurysm of left femoral artery (HCC) Active Problems:   Atrial fibrillation (HCC)   Neurogenic pain   History of total hip replacement (THR) (Right)   Chronic GERD   Chronic constipation   Pancreatic insufficiency    Hospital Course: The patient was admitted to the hospital on 10/03/2021 with left groin pain.  Found to have a pseudoaneurysm confirmed on ultrasound and CT scan.  Patient's Eliquis was held and she was placed on heparin drip.  Dr. Delana Meyer did a coil embolization to the left groin pseudoaneurysm on 01/06/2022.  The patient was placed back on heparin drip that evening and converted over to Eliquis on 01/07/2022.  I did prescribe 10 pills of oxycodone to go home with for her discomfort.  Assessment and Plan: * Pseudoaneurysm of left femoral artery (HCC) 3.5 cm pseudoaneurysm left groin status post coil embolization repair of pseudoaneurysm by Dr. Delana Meyer on 01/06/2022.  Resumed Eliquis on 01/07/2022.  Follow-up with vascular surgery in 3 weeks.  Atrial fibrillation (HCC) Paroxysmal in nature.  Resumed Eliquis prior to discharge.  Patient on Cardizem CD.  She states she does not take amiodarone anymore.  This medication was discontinued.  Neurogenic pain On chronic pain meds.  I did not prescribe her chronic pain medications only a few pills of acute medications to go home with after procedure.  Pancreatic insufficiency Continue Creon with meals.  Chronic constipation Please go back on Linzess  Chronic GERD On Protonix         Consultants: Vascular surgery Procedures performed: Coil embolization left groin  pseudoaneurysm Disposition: Home Diet recommendation:  Cardiac diet DISCHARGE MEDICATION: Allergies as of 01/07/2022       Reactions   Amoxicillin Hives   She did ok w ANCEF   Chlorhexidine Gluconate Dermatitis, Hives   Clindamycin Hives   Codeine Hives   Erythromycin Hives   "mycins" in general   Penicillin G Hives   "cillins" in general   Sulfa Antibiotics Nausea And Vomiting, Hives       Levofloxacin Hives   Shellfish Allergy Hives   Decadron [dexamethasone] Other (See Comments)   Hot flashes, insomnia, "manic" Hot flashes, insomnia, "manic"   Mangifera Indica Hives   papaya   Papaya Derivatives Hives   Betadine [povidone Iodine] Hives       Clarithromycin Hives   Other Hives    Mango   Povidone-iodine Hives   Prednisone Anxiety   High blood pressure, flushed, mood changes, heart palpitations High blood pressure, flushed, mood changes, heart palpitations        Medication List     STOP taking these medications    amiodarone 200 MG tablet Commonly known as: Pacerone   neomycin-polymyxin b-dexamethasone 3.5-10000-0.1 Susp Commonly known as: MAXITROL       TAKE these medications    acetaminophen 325 MG tablet Commonly known as: TYLENOL Take 2 tablets (650 mg total) by mouth every 6 (six) hours as needed for mild pain (or Fever >/= 101).   ALPRAZolam 1 MG tablet Commonly known as: XANAX Take 1 mg by mouth at bedtime.   apixaban 5 MG Tabs tablet Commonly known as: ELIQUIS Take 1 tablet (  5 mg total) by mouth 2 (two) times daily.   busPIRone 15 MG tablet Commonly known as: BUSPAR Take 15 mg by mouth 2 (two) times daily.   Creon 36000 UNITS Cpep capsule Generic drug: lipase/protease/amylase TAKE 2 CAPSULES WITH THE FIRST BITE OF EACH MEAL AND 1 CAPSULE WITH THE FIRST BITE OF EACH SNACK   diltiazem 120 MG 24 hr capsule Commonly known as: CARDIZEM CD Take 1 capsule (120 mg total) by mouth daily.   linaclotide 290 MCG Caps capsule Commonly  known as: LINZESS Take 1 capsule (290 mcg total) by mouth daily before breakfast.   montelukast 10 MG tablet Commonly known as: SINGULAIR TAKE 1 TABLET BY MOUTH EVERY DAY   morphine 15 MG tablet Commonly known as: MSIR Take 1 tablet (15 mg total) by mouth every 6 (six) hours as needed for moderate pain or severe pain. Must last 30 days. What changed: Another medication with the same name was removed. Continue taking this medication, and follow the directions you see here.   naloxone 4 MG/0.1ML Liqd nasal spray kit Commonly known as: NARCAN Place 1 spray into the nose as needed for up to 365 doses (for opioid-induced respiratory depresssion). In case of emergency (overdose), spray once into each nostril. If no response within 3 minutes, repeat application and call [phone].   omeprazole 40 MG capsule Commonly known as: PRILOSEC TAKE 1 CAPSULE (40 MG TOTAL) BY MOUTH 2 (TWO) TIMES DAILY BEFORE A MEAL.   Oxycodone HCl 10 MG Tabs Take 1 tablet (10 mg total) by mouth every 6 (six) hours as needed for severe pain.   sodium chloride 0.65 % Soln nasal spray Commonly known as: OCEAN Place 1 spray into both nostrils daily as needed for congestion (Dryness).   valACYclovir 1000 MG tablet Commonly known as: VALTREX TAKE TWO TABLETS BY MOUTH TWICE DAILY FOR ONE DAY FEVER BLISTER        Follow-up Information     Schnier, Dolores Lory, MD Follow up in 3 week(s).   Specialties: Vascular Surgery, Cardiology, Radiology, Vascular Surgery Why: follow up after procedure with ultrasound left groin for pseudoaneurysm Contact information: [place] [address] [phone]         Virginia Crews, MD Follow up in 5 day(s).   Specialty: Family Medicine Contact information: [address] [place] [address] [phone]                Discharge Exam: Danley Danker Weights   01/03/22 2203  Weight: 71.5 kg   Physical Exam HENT:     Head: Normocephalic.      Mouth/Throat:     Pharynx: No oropharyngeal exudate.  Eyes:     General: Lids are normal.     Conjunctiva/sclera: Conjunctivae normal.  Cardiovascular:     Rate and Rhythm: Normal rate and regular rhythm.     Pulses:          Dorsalis pedis pulses are 2+ on the right side.       Posterior tibial pulses are 2+ on the right side.     Heart sounds: Normal heart sounds, S1 normal and S2 normal.  Pulmonary:     Breath sounds: No decreased breath sounds, wheezing, rhonchi or rales.  Abdominal:     Palpations: Abdomen is soft.     Tenderness: There is no abdominal tenderness.  Musculoskeletal:     Right lower leg: No swelling.     Left lower leg: No swelling.  Skin:  General: Skin is warm.     Findings: No rash.  Neurological:     Mental Status: She is alert and oriented to person, place, and time.      Condition at discharge: stable  The results of significant diagnostics from this hospitalization (including imaging, microbiology, ancillary and laboratory) are listed below for reference.   Imaging Studies: PERIPHERAL VASCULAR CATHETERIZATION  Result Date: 01/06/2022 See surgical note for result.  CT Angio Abd/Pel w/ and/or w/o  Result Date: 01/05/2022 CLINICAL DATA:  Abdominal pain, left groin mass EXAM: CTA ABDOMEN AND PELVIS WITHOUT AND WITH CONTRAST TECHNIQUE: Multidetector CT imaging of the abdomen and pelvis was performed using the standard protocol during bolus administration of intravenous contrast. Multiplanar reconstructed images and MIPs were obtained and reviewed to evaluate the vascular anatomy. RADIATION DOSE REDUCTION: This exam was performed according to the departmental dose-optimization program which includes automated exposure control, adjustment of the mA and/or kV according to patient size and/or use of iterative reconstruction technique. CONTRAST:  167m OMNIPAQUE IOHEXOL 350 MG/ML SOLN COMPARISON:  CT 07/28/2021 and previous FINDINGS: VASCULAR Aorta: Minimal  calcified atheromatous plaque. No aneurysm, dissection, or stenosis. Celiac: Patent without evidence of aneurysm, dissection, vasculitis or significant stenosis. SMA: Patent without evidence of aneurysm, dissection, vasculitis or significant stenosis. Renals: Both renal arteries are patent without evidence of aneurysm, dissection, vasculitis, fibromuscular dysplasia or significant stenosis. IMA: Patent without evidence of aneurysm, dissection, vasculitis or significant stenosis. Inflow: Patent without evidence of aneurysm, dissection, vasculitis or significant stenosis. Proximal Outflow: 3.5 cm pseudoaneurysm medial to the proximal left SFA, originating from a proximal medial branch. Bilateral common femoral and visualized portions of deep femoral and superficial femoral arteries remain patent. Veins: Patent hepatic veins, portal vein, SMV, splenic vein, bilateral renal veins. The iliac venous system and SVC are unremarkable. Review of the MIP images confirms the above findings. NON-VASCULAR Lower chest: No pleural or pericardial effusion. Minimal linear scarring or atelectasis posteriorly in the lower lobes. Hepatobiliary: Post cholecystectomy. Mild prominence of central intrahepatic bile ducts, and ectatic CBD up to 1 cm diameter. Pancreas: Unremarkable. No pancreatic ductal dilatation or surrounding inflammatory changes. Spleen: Normal in size without focal abnormality. Adrenals/Urinary Tract: No adrenal mass. Focal parenchymal loss in the upper pole right kidney as before. Otherwise symmetric renal parenchymal enhancement without focal lesion or hydronephrosis. Urinary bladder physiologically distended. Stomach/Bowel: Post Nissen fundoplication. The stomach is incompletely distended, otherwise unremarkable. Small bowel decompressed. Normal appendix. The colon is incompletely distended limiting mucosal evaluation, no acute findings. Lymphatic: No abdominal or pelvic adenopathy. Reproductive: Status post  hysterectomy. No adnexal masses. Other: No ascites.  No free air. Musculoskeletal: Right hip arthroplasty components project in expected location. Changes of posterior cemented fusion T10-L4, hardware intact without surrounding lucency. No acute findings. IMPRESSION: 1. 3.5 cm pseudoaneurysm medial to the proximal left SFA, probably arising from a muscular branch vessel. 2. No acute intra-abdominal findings. 3. Post cholecystectomy with mild prominence of central intrahepatic bile ducts, and ectatic CBD up to 1 cm diameter. Correlate with LFTs. 4. Post Nissen fundoplication. 5. Aortic Atherosclerosis (ICD10-I70.0). Electronically Signed   By: DLucrezia EuropeM.D.   On: 01/05/2022 09:11   UK[place]Lower Ext Art Left Ltd  Result Date: 01/03/2022 CLINICAL DATA:  Tenderness in the left groin 3 months after cardiac ablation. EXAM: Left LOWER EXTREMITY ARTERIAL DUPLEX SCAN TECHNIQUE: Gray-scale sonography as well as color Doppler and duplex ultrasound of the left groin was performed. COMPARISON:  CT abdomen and pelvis 07/28/2021 FINDINGS: Left lower  Extremity Focused examination was obtained of the left groin to evaluate the area of tenderness. Grayscale images, color flow Doppler images, and spectral Doppler imaging was obtained. Corresponding to the area of tenderness, there is a 3.7 x 2.6 x 3.9 cm diameter vascular collection containing flow on color flow Doppler imaging. A communication is demonstrated between this structure and the femoral artery and vein. The lesion decreases but remains present with compression. No mural thrombus is identified. IMPRESSION: Left groin pseudoaneurysm with direct communication to the left common femoral artery and vein. Electronically Signed   By: Lucienne Capers M.D.   On: 01/03/2022 20:00   DG HIP UNILAT W OR W/O PELVIS 2-3 VIEWS RIGHT  Result Date: 12/16/2021 CLINICAL DATA:  Right hip pain. EXAM: DG HIP (WITH OR WITHOUT PELVIS) 2-3V RIGHT COMPARISON:  May 13, 2012. FINDINGS: Status  post right total hip arthroplasty. The right femoral and acetabular components are well situated. No fracture or dislocation is noted. IMPRESSION: No acute abnormality seen. Electronically Signed   By: Marijo Conception M.D.   On: 12/16/2021 11:35   DG Lumbar Spine Complete W/Bend  Result Date: 12/16/2021 CLINICAL DATA:  LOWER back pain. EXAM: LUMBAR SPINE - COMPLETE WITH BENDING VIEWS COMPARISON:  05/20/2018 FINDINGS: Prior posterior fusion of T10 through L4. Normal alignment. No translocation with flexion or extension. No lytic or blastic lesions. No hardware failure or acute fracture. Bowel gas pattern is nonobstructive. Surgical clips are present in the abdomen. Prior RIGHT hip arthroplasty. IMPRESSION: Prior posterior fusion. No evidence for instability. Electronically Signed   By: Nolon Nations M.D.   On: 12/16/2021 10:51    Microbiology: Results for orders placed or performed during the hospital encounter of 08/08/21  Urine Culture     Status: Abnormal   Collection Time: 08/09/21  2:19 AM   Specimen: Urine, Clean Catch  Result Value Ref Range Status   Specimen Description   Final    URINE, CLEAN CATCH Performed at [facility], [address] [place], [place] [address]    Special Requests   Final    NONE Performed at [facility], [address] [place], [place] [address]    Culture (A)  Final    30,000 COLONIES/mL MULTIPLE SPECIES PRESENT, SUGGEST RECOLLECTION   Report Status 08/10/2021 FINAL  Final    Labs: CBC: Recent Labs  Lab 01/03/22 2100 01/04/22 0553 01/05/22 0619 01/06/22 0436 01/07/22 0420 01/07/22 0855  WBC 7.8 5.9 6.7 5.8 6.1 6.6  NEUTROABS 4.3  --   --   --   --   --   HGB 14.0 12.2 12.9 12.5 12.3 12.3  HCT 41.8 35.8* 38.0 37.8 37.2 36.7  MCV 87.4 85.9 85.4 86.1 85.9 85.2  PLT 350 283 309 268 285 622   Basic Metabolic Panel: Recent Labs  Lab 01/03/22 2100 01/04/22 0553 01/07/22 0420  NA 137 139 139  K 4.2 4.0 3.8  CL 104 108  109  CO2 _0 GLUCOSE 122* 97 105*  BUN _1 CREATININE 0.89 0.80 0.82  CALCIUM 9.3 8.7* 8.1*   Liver Function Tests: Recent Labs  Lab 01/04/22 0553  AST 12*  ALT 10  ALKPHOS 87  BILITOT 0.5  PROT 6.0*  ALBUMIN 3.3*   CBG: No results for input(s): "GLUCAP" in the last 168 hours.  Discharge time spent: greater than 30 minutes.  Signed: Loletha Grayer, MD [facility] 01/07/2022

## 2022-01-07 NOTE — Assessment & Plan Note (Signed)
Please go back on Linzess

## 2022-01-07 NOTE — Progress Notes (Signed)
Discharge instructions explained to pt/ verbalized an understanding/ ivs and tele removed/ transported off unit via wheelchair.

## 2022-01-07 NOTE — Consult Note (Signed)
ANTICOAGULATION CONSULT NOTE  Pharmacy Consult for Heparin Infusion Indication: atrial fibrillation  Patient Measurements: Height: '5\' 4"'$  (162.6 cm) Weight: 71.5 kg (157 lb 10.1 oz) IBW/kg (Calculated) : 54.7 Heparin Dosing Weight: 69.3 kg  Labs: Recent Labs    01/04/22 0553 01/04/22 1327 01/05/22 0619 01/05/22 0713 01/05/22 2145 01/06/22 0436 01/06/22 1058 01/07/22 0420  HGB 12.2  --  12.9  --   --  12.5  --  12.3  HCT 35.8*  --  38.0  --   --  37.8  --  37.2  PLT 283  --  309  --   --  268  --  285  APTT  --    < >  --    < > 66* 154* 88*  --   HEPARINUNFRC  --    < >  --    < >  --  0.53 0.57 0.41  CREATININE 0.80  --   --   --   --   --   --  0.82   < > = values in this interval not displayed.     Estimated Creatinine Clearance: 69 mL/min (by C-G formula based on SCr of 0.82 mg/dL).   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    Chronic headaches    Chronic, continuous use of opioids    DDD (degenerative disc disease)    CERVICAL AND LUMBAR-CHRONIC PAIN, RT HIP LABRAL TEAR   Depression    PT STATES A LOT OF STRESS IN HER LIFE   Dissociative disorder    Dizziness 04/22/2013   Duodenal ulcer with hemorrhage and perforation ([place]) 04/27/2003   Foot drop, right    FROM BACK SURGERY   GERD (gastroesophageal reflux disease)    H/O arthrodesis 08/18/2014   Headache(784.0)    AND NECK PAIN--STATES RECENT TEST SHOW CERVICAL DEGENERATION   History of blood transfusion    s/p back surgery   History of cardiac cath    a. 05/2019 Cath: Nl cors. EF 55-65%.   History of cardioversion     History of cervical spinal surgery 01/04/2015   History of kidney stones    Hypertension    Inverted T wave    Iron deficiency 02/01/2021   Mitral regurgitation    a. 07/2020 Echo: EF 60-65%, no rwma. Nl RV size/fxn. RVSP 39.63mHg. Mildly to mod dil LA. Mod MR; b. 07/2020 TEE: EF 55-60%. Lambl's excresence. Nl RV size/fxn. Midly dil LA. No LA/LAA thrombus. Mild MR.   Narrowing of intervertebral disc space 08/18/2014   Currently on disability.    Orthostatic hypotension 04/22/2013   Pain    CHRONIC NECK AND BACK PAIN - LIMITED ROM NECK - S/P FUSIONS CERVICAL AND LUMBAR   Paroxysmal atrial fibrillation ([facility] 2021   a. 07/2020 s/p PVI.   Pneumonia    PONV (postoperative nausea and vomiting)    PT GIVES HX OF N&V AND FEVER WITH SURGERIES YEARS AGO--BUT NO PROBLEMS WITH MORE RECENT SURGERIES--STATES NOT MALIGNANT HYPERTHERMIA   Postop Hyponatremia 05/14/2012  Postoperative anemia due to acute blood loss 05/14/2012   PTSD (post-traumatic stress disorder)    Right foot drop    Right hip arthralgia 08/18/2014   Status post surgery of right, and now needs left.    Sleep apnea 2021   does not have a cpap   Therapeutic opioid-induced constipation (OIC)    Typical atrial flutter (HCC)     Medications:  Patient takes Eliquis 5 mg po BID, per patient last dose was in the morning on 01/03/22.  Assessment: 61 yo female who presented to the ED with a left femoral pseudoaneurysm.  History of Afib.  Pharmacy consulted to convert to heparin infusion while hospitalized.  Baseline INR 1.2, aPTT 35s, HL 0.89.   Daily CBC with H&H and platelets within normal limits.    Goal of Therapy:  aPTT 66-102 seconds Heparin level 0.3-0.7 units/ml once APTT and heparin level correlate Monitor platelets by anticoagulation protocol: Yes  Date/Time aPTT/HL Comments 1025 2036  aPTT 65s Subthera; 1000 un/hr 10/26 0713  aPTT 159s  Suprathera; 1100 un/hr 10/26 1532 aPTT 76s Therapeutic x 1  950 un/hr 10/26  2145 aPTT 66s Therapeutic x 2 950 un/hr 10/27 0436 154s / 0.53 aPTT Suprathera, HL thera 10/27 1058 88s / 0.57  Both aPTT and HL are therapeutic 10/28  0446 0.41  Therapeutic  Plan:  Continue heparin infusion at current rate of 950 un/hr Continue with daily HL. Next HL tomorrow AM Daily CBC per protocol while on IV heparin  Wynelle Cleveland, PharmD 01/07/2022 4:49 AM

## 2022-01-09 ENCOUNTER — Telehealth: Payer: Self-pay | Admitting: *Deleted

## 2022-01-09 ENCOUNTER — Encounter: Payer: Self-pay | Admitting: Vascular Surgery

## 2022-01-09 NOTE — Patient Outreach (Signed)
  Care Coordination [facility] Note Transition Care Management Follow-up Telephone Call Date of discharge and from where: [facility] [phone] How have you been since you were released from the hospital? I am sore and real tired Any questions or concerns? No  Items Reviewed: Did the pt receive and understand the discharge instructions provided? Yes  Medications obtained and verified? Yes  Other? No  Any new allergies since your discharge? No  Dietary orders reviewed? No Do you have support at home? Yes   Home Care and Equipment/Supplies: Were home health services ordered? not applicable If so, what is the name of the agency? N/a  Has the agency set up a time to come to the patient's home? no Were any new equipment or medical supplies ordered?  No What is the name of the medical supply agency? N/a Were you able to get the supplies/equipment? not applicable Do you have any questions related to the use of the equipment or supplies? No  Functional Questionnaire: (I = Independent and D = Dependent) ADLs: I  Bathing/Dressing- I  Meal Prep- I  Eating- I  Maintaining continence- I  Transferring/Ambulation- I  Managing Meds- I  Follow up appointments reviewed:  PCP Hospital f/u appt confirmed? Yes  Scheduled to see [phone] [facility] f/u appt confirmed? Y  Scheduled to see Dr Marius Ditch [phone] 1:00  transportation arrangements needed? No  If their condition worsens, is the pt aware to call PCP or go to the Emergency Dept.? y Was the patient provided with contact information for the PCP's office or ED? Yes Was to pt encouraged to call back with questions or concerns? Yes  SDOH assessments and interventions completed:   Yes  Care Coordination Interventions Activated:  Yes   Care Coordination Interventions:  RN gave patient information on how to activate the [facility] meal Plan     Encounter Outcome:  Pt. Visit Completed    [facility]  Management [phone]

## 2022-01-16 ENCOUNTER — Ambulatory Visit: Payer: Medicare Other | Admitting: Gastroenterology

## 2022-01-16 ENCOUNTER — Other Ambulatory Visit: Payer: Self-pay

## 2022-01-16 ENCOUNTER — Ambulatory Visit: Payer: Self-pay | Admitting: *Deleted

## 2022-01-16 ENCOUNTER — Encounter: Payer: Self-pay | Admitting: Gastroenterology

## 2022-01-16 VITALS — BP 114/74 | HR 74 | Temp 98.2°F | Ht 64.0 in | Wt 161.1 lb

## 2022-01-16 DIAGNOSIS — K219 Gastro-esophageal reflux disease without esophagitis: Secondary | ICD-10-CM

## 2022-01-16 DIAGNOSIS — R140 Abdominal distension (gaseous): Secondary | ICD-10-CM | POA: Diagnosis not present

## 2022-01-16 DIAGNOSIS — K8681 Exocrine pancreatic insufficiency: Secondary | ICD-10-CM

## 2022-01-16 DIAGNOSIS — K5904 Chronic idiopathic constipation: Secondary | ICD-10-CM

## 2022-01-16 MED ORDER — DEXLANSOPRAZOLE 60 MG PO CPDR: 60.0000 mg | DELAYED_RELEASE_CAPSULE | Freq: Every day | ORAL | 0 refills | Status: DC

## 2022-01-16 MED ORDER — FLUCONAZOLE 150 MG PO TABS: 150.0000 mg | ORAL_TABLET | Freq: Once | ORAL | 0 refills | Status: AC

## 2022-01-16 NOTE — Telephone Encounter (Signed)
Summary: Possible UTI   Pt has a yeast infection from medication prescribed after her surgery, please advise. Says her symptoms are very uncomfortable  Best contact: [phone]       Chief Complaint: vaginal irritation, s/p surgery for aneurysm. Received antibiotics and developed sx Symptoms: vaginal irritation, itching, burning with urination. Pain with wiping.  Frequency: 1 week  Pertinent Negatives: Patient denies fever.  Disposition: '[]'$ ED /'[]'$ Urgent Care (no appt availability in office) / '[x]'$ Appointment(In office/virtual)/ '[]'$  [facility]/ '[]'$ Home Care/ '[]'$ Refused Recommended Disposition /'[]'$ [facility]/ '[]'$  Follow-up with PCP Additional Notes:   Appt already scheduled for physical. Please advise if medication can be given prior to appt due to sx and will f/u at appt. Friday        Reason for Disposition  [1] Symptoms of a "yeast infection" (i.e., itchy, white discharge, not bad smelling) AND [2] not improved > 3 days following Care Advice  Answer Assessment - Initial Assessment Questions 1. DISCHARGE: "Describe the discharge." (e.g., white, yellow, green, gray, foamy, cottage cheese-like)     Not thick discharge , feels irritation in vaginal area. 2. ODOR: "Is there a bad odor?"     no 3. ONSET: "When did the discharge begin?"     After surgery approx 1 week ago  4. RASH: "Is there a rash in the genital area?" If Yes, ask: "Describe it." (e.g., redness, blisters, sores, bumps)     na 5. ABDOMEN PAIN: "Are you having any abdomen pain?" If Yes, ask: "What does it feel like? " (e.g., crampy, dull, intermittent, constant)      no 6. ABDOMEN PAIN SEVERITY: If present, ask: "How bad is it?" (e.g., Scale 1-10; mild, moderate, or severe)   - MILD (1-3): Doesn't interfere with normal activities, abdomen soft and not tender to touch.    - MODERATE (4-7): Interferes with normal activities or awakens from sleep, abdomen tender to touch.    - SEVERE (8-10):  Excruciating pain, doubled over, unable to do any normal activities. (R/O peritonitis)      no 7. CAUSE: "What do you think is causing the discharge?" "Have you had the same problem before? What happened then?"     Recent surgery for aneurysm. Received antibiotic vanco then developed sx 8. OTHER SYMPTOMS: "Do you have any other symptoms?" (e.g., fever, itching, vaginal bleeding, pain with urination, injury to genital area, vaginal foreign body)    Vaginal itching and painful to wipe burning with urination 9. PREGNANCY: "Is there any chance you are pregnant?" "When was your last menstrual period?"     na  Protocols used: Vaginal Discharge-A-AH

## 2022-01-16 NOTE — Progress Notes (Signed)
Cephas Darby, MD [address]  [place]  [place], [place] [address]  Main: [phone]  Fax: [phone]    Gastroenterology Consultation  Referring Provider:     Virginia Crews, MD Primary Care Physician:  Virginia Crews, MD Primary Gastroenterologist:  Dr. Cephas Darby Reason for Consultation:     EPI, chronic constipation and abdominal bloating        HPI:   [name] is a 61 y.o. female referred by Dr. Brita Romp, Dionne Bucy, MD  for consultation & management of exocrine pancreatic insufficiency. Patient has history of paroxysmal A. fib/flutter s/p ablation on 08/06/2020, sinus bradycardia, hypertension, asthma, GERD s/p robotic assisted laparoscopic paraesophageal hernia repair with Nissen's fundoplication in 24/2353 by Dr. Dahlia Byes, history of OSA.  Patient was admitted to [facility] in early November 2022 secondary to severe abdominal discomfort, nausea.  She underwent CT angio chest abdomen and pelvis with no evidence of acute intra-abdominal pathology.  There is no evidence of significant visceral artery stenosis.  There was presence of nonspecific enteritis and small bowel ileus with no evidence of bowel obstruction.  There was also presence of pancreatic atrophy, she is status postcholecystectomy.  Patient was managed conservatively which resolved her ileus and she started moving her bowels.  Subsequently, patient underwent upper endoscopy and colonoscopy including biopsies which were all unremarkable.  There was no evidence of IBD or any other pathology.  Patient is found to have low pancreatic fecal elastase levels 107, therefore started on Creon.  Since discharge, patient reports that she has been taking Creon up to 3 pills daily because she does not have 3 full meals a day.  She gained about 4 pounds since discharge.  She does report ongoing abdominal discomfort, abdominal bloating and loose stools sometimes mushy and foul-smelling and the symptoms are  predominantly postprandial.  She is also found to have iron deficiency anemia, received parenteral iron therapy as outpatient.  She has follow-up labs in 04/2021 by Dr. Rogue Bussing. She was not a smoker in her life.  She does not drink alcohol  Follow-up visit 07/14/2021 Patient reports ongoing upper abdominal discomfort associated with abdominal bloating and intermittent constipation.  She started taking MiraLAX 3 capfuls daily to move her bowels which lead to 3-4 loose bowel movements a day.  She tried higher dose of Linzess which did not help. She admits to drinking less water and consumes ice tea daily.  She likes to eat sweet potato and white potatoes on a regular basis.  She does not consume any fruit juices, red meat.  She is taking Creon with each meal.  She is gaining weight.  She wanted to see dietitian at [facility], however, her insurance does not cover the visit and she has to pay $300 consultation fee.  She decided not to pursue  NSAIDs: None  Antiplts/Anticoagulants/Anti thrombotics: Eliquis for history of A. fib  GI Procedures:  EGD 12/07/2021 - Normal duodenal bulb and second portion of the duodenum. - Normal stomach. - A Nissen fundoplication was found. The wrap appears intact. - Normal gastroesophageal junction and esophagus. Biopsied. DIAGNOSIS:  A. ESOPHAGUS, RANDOM; COLD BIOPSY:  - SQUAMOUS MUCOSA WITH CHANGES COMPATIBLE WITH REFLUX.  - NEGATIVE FOR INTRAEPITHELIAL EOSINOPHILS, DYSPLASIA, AND MALIGNANCY.    Upper endoscopy 01/15/21 - Suspected jejunal inflammation characterized by friability. Biopsied. - Normal examined duodenum. Biopsied. - Mucosal changes suspicious for gastritis. Biopsied. - A Nissen fundoplication was found. The wrap appears intact. - Esophagogastric landmarks identified. -  Normal gastroesophageal junction and esophagus. DIAGNOSIS:  A. JEJUNUM; COLD BIOPSY:  - ENTERIC MUCOSA WITH DENSE LYMPHOPLASMACYTIC INFILTRATE.  - NEGATIVE FOR ACTIVE  INFLAMMATION.  - NEGATIVE FOR DYSPLASIA NAD MALIGNANCY.   B. DUODENUM; COLD BIOPSY:  - DUODENAL MUCOSA WITH NO SIGNIFICANT PATHOLOGIC ALTERATION.  - NEGATIVE FOR FEATURES OF CELIAC DISEASE.  - NEGATIVE FOR DYSPLASIA AND MALIGNANCY.   C. STOMACH, RANDOM; COLD BIOPSY:  - CHRONIC ACTIVE GASTRITIS WITH REACTIVE CHANGES.  - NEGATIVE FOR HELICOBACTER PYLORI BY IMMUNOHISTOCHEMISTRY.  - NEGATIVE FOR INTESTINAL METAPLASIA, DYSPLASIA, AND MALIGNANCY.   Colonoscopy 01/16/2021 - Preparation of the colon was fair. - The examined portion of the ileum was normal. - The entire examined colon is normal. Biopsied. - The distal rectum and anal verge are normal on retroflexion view. DIAGNOSIS:  A. COLON, RANDOM; COLD BIOPSY:  - COLONIC MUCOSA WITH NO SIGNIFICANT PATHOLOGIC ALTERATION.  - NEGATIVE FOR ACTIVE INFLAMMATION AND FEATURES OF CHRONICITY.  - NEGATIVE FOR DYSPLASIA AND MALIGNANCY.   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    Chronic headaches    Chronic, continuous use of opioids    DDD (degenerative disc disease)    CERVICAL AND LUMBAR-CHRONIC PAIN, RT HIP LABRAL TEAR   Depression    PT STATES A LOT OF STRESS IN HER LIFE   Dissociative disorder    Dizziness 04/22/2013   Duodenal ulcer with hemorrhage and perforation ([place]) 04/27/2003   Foot drop, right    FROM BACK SURGERY   GERD (gastroesophageal reflux disease)    H/O arthrodesis 08/18/2014   Headache(784.0)    AND NECK PAIN--STATES RECENT TEST SHOW CERVICAL DEGENERATION   History of blood transfusion    s/p back surgery   History of cardiac cath    a. 05/2019  Cath: Nl cors. EF 55-65%.   History of cardioversion    History of cervical spinal surgery 01/04/2015   History of kidney stones    Hypertension    Inverted T wave    Iron deficiency 02/01/2021   Mitral regurgitation    a. 07/2020 Echo: EF 60-65%, no rwma. Nl RV size/fxn. RVSP 39.39mHg. Mildly to mod dil LA. Mod MR; b. 07/2020 TEE: EF 55-60%. Lambl's excresence. Nl RV size/fxn. Midly dil LA. No LA/LAA thrombus. Mild MR.   Narrowing of intervertebral disc space 08/18/2014   Currently on disability.    Orthostatic hypotension 04/22/2013   Pain    CHRONIC NECK AND BACK PAIN - LIMITED ROM NECK - S/P FUSIONS CERVICAL AND LUMBAR   Paroxysmal atrial fibrillation ([place] 2021   a. 07/2020 s/p PVI.   Pneumonia    PONV (postoperative nausea and vomiting)    PT GIVES HX OF N&V AND FEVER WITH SURGERIES YEARS AGO--BUT NO PROBLEMS WITH MORE RECENT SURGERIES--STATES NOT MALIGNANT HYPERTHERMIA   Postop Hyponatremia 05/14/2012   Postoperative anemia due to acute blood loss 05/14/2012   PTSD (post-traumatic stress disorder)    Right foot drop    Right hip arthralgia 08/18/2014   Status post surgery of right, and now needs left.    Sleep  apnea 2021   does not have a cpap   Therapeutic opioid-induced constipation (OIC)    Typical atrial flutter [facility])     Past Surgical History:  Procedure Laterality Date   ABDOMINAL HYSTERECTOMY     afib  05/2019   ANTERIOR CERVICAL DECOMP/DISCECTOMY FUSION N/A 10/30/2012   Procedure: ACDF C5-6, EXPLORATION AND HARDWARE REMOVAL C6-7;  Surgeon: Melina Schools, MD;  Location: [facility];  Service: Orthopedics;  Laterality: N/A;   ANTERIOR FUSION CERVICAL SPINE  MAY 2012   AT [place] N/A 08/06/2020   Procedure: ATRIAL FIBRILLATION ABLATION;  Surgeon: Vickie Epley, MD;  Location: [place] [facility];  Service: Cardiovascular;  Laterality: N/A;   BACK SURGERY  2009   LUMBAR FUSION WITH RODS    BREAST BIOPSY Right 2008   benign.- bx/clip    BREAST EXCISIONAL BIOPSY Left 1998   benign   BREAST REDUCTION SURGERY Bilateral 06/2016   CARDIAC ELECTROPHYSIOLOGY MAPPING AND ABLATION     CARDIOVERSION N/A 12/03/2019   Procedure: CARDIOVERSION;  Surgeon: Kate Sable, MD;  Location: [facility];  Service: Cardiovascular;  Laterality: N/A;   CARDIOVERSION N/A 08/01/2021   Procedure: CARDIOVERSION;  Surgeon: Kate Sable, MD;  Location: [facility];  Service: Cardiovascular;  Laterality: N/A;   CARDIOVERSION N/A 08/05/2021   Procedure: CARDIOVERSION;  Surgeon: Minna Merritts, MD;  Location: [facility];  Service: Cardiovascular;  Laterality: N/A;   CARPAL TUNNEL RELEASE  05-06-12   Right   CHOLECYSTECTOMY     COLONOSCOPY WITH PROPOFOL N/A 01/26/2017   Procedure: COLONOSCOPY WITH PROPOFOL;  Surgeon: Lucilla Lame, MD;  Location: [facility];  Service: Endoscopy;  Laterality: N/A;   COLONOSCOPY WITH PROPOFOL N/A 01/16/2021   Procedure: COLONOSCOPY WITH PROPOFOL;  Surgeon: Lin Landsman, MD;  Location: [facility];  Service: Gastroenterology;  Laterality: N/A;   DIAGNOSTIC LAPAROSCOPIES - MULTIPLE FOR ENDOMETRIOSIS     ESOPHAGEAL DILATION N/A 01/26/2017   Procedure: ESOPHAGEAL DILATION;  Surgeon: Lucilla Lame, MD;  Location: [facility];  Service: Endoscopy;  Laterality: N/A;   ESOPHAGEAL MANOMETRY N/A 05/30/2017   Procedure: ESOPHAGEAL MANOMETRY (EM);  Surgeon: Lucilla Lame, MD;  Location: [facility];  Service: Endoscopy;  Laterality: N/A;   ESOPHAGOGASTRODUODENOSCOPY (EGD) WITH PROPOFOL N/A 01/26/2017   Procedure: ESOPHAGOGASTRODUODENOSCOPY (EGD) WITH PROPOFOL;  Surgeon: Lucilla Lame, MD;  Location: [place] [facility];  Service: Endoscopy;  Laterality: N/A;   ESOPHAGOGASTRODUODENOSCOPY (EGD) WITH PROPOFOL N/A 01/30/2020   Procedure: ESOPHAGOGASTRODUODENOSCOPY (EGD) WITH PROPOFOL;  Surgeon: Lucilla Lame, MD;  Location: [facility];  Service: Endoscopy;  Laterality: N/A;  sleep apnea COVID + 01-15-20    ESOPHAGOGASTRODUODENOSCOPY (EGD) WITH PROPOFOL N/A 01/15/2021   Procedure: ESOPHAGOGASTRODUODENOSCOPY (EGD) WITH PROPOFOL;  Surgeon: Lin Landsman, MD;  Location: [place];  Service: Gastroenterology;  Laterality: N/A;   ESOPHAGOGASTRODUODENOSCOPY (EGD) WITH PROPOFOL N/A 12/07/2021   Procedure: ESOPHAGOGASTRODUODENOSCOPY (EGD) WITH PROPOFOL;  Surgeon: Lin Landsman, MD;  Location: [facility]COPY;  Service: Gastroenterology;  Laterality: N/A;   HIP ARTHROSCOPY  09/20/2011   Procedure: ARTHROSCOPY HIP;  Surgeon: Gearlean Alf, MD;  Location: [facility];  Service: Orthopedics;  Laterality: Right;  Right Hip Scope with Labral Debridement   LEFT HEART CATH AND CORONARY ANGIOGRAPHY Left 06/10/2019   Procedure: LEFT HEART CATH AND CORONARY ANGIOGRAPHY;  Surgeon: Nelva Bush, MD;  Location: [place] [facility];  Service: Cardiovascular;  Laterality: Left;   LOWER EXTREMITY ANGIOGRAPHY Left 01/06/2022   Procedure: Lower Extremity Angiography;  Surgeon: Katha Cabal,  MD;  Location: [place] [facility];  Service: Cardiovascular;  Laterality: Left;   NASAL SEPTUM SURGERY  MARCH 2013   IN [place]   [facility] IMPEDANCE STUDY N/A 05/30/2017   Procedure: [place] IMPEDANCE STUDY;  Surgeon: Lucilla Lame, MD;  Location: [facility] ENDOSCOPY;  Service: Endoscopy;  Laterality: N/A;   POLYPECTOMY N/A 01/26/2017   Procedure: POLYPECTOMY;  Surgeon: Lucilla Lame, MD;  Location: [facility];  Service: Endoscopy;  Laterality: N/A;   POSTERIOR CERVICAL FUSION/FORAMINOTOMY N/A 04/16/2013   Procedure: REMOVAL CERVICAL PLATES AND INTERBODY CAGE/POSTERIOR CERVICAL SPINAL FUSION C4 - C6/C5 CORPECTOMY/C4 - C6 FUSION WITH ILIAC CREST BONE GRAFT;  Surgeon: Melina Schools, MD;  Location: [facility];  Service: Orthopedics;  Laterality: N/A;   RADIOFREQUENCY ABLATION NERVES     REDUCTION MAMMAPLASTY Bilateral 05/2016   RIGHT HIP ARTHROSCOPY FOR LABRAL TEAR  ABOUT 2010   2012 also   SHOULDER ARTHROSCOPY  05-06-12   bone spur    TEE WITHOUT CARDIOVERSION N/A 08/05/2020   Procedure: TRANSESOPHAGEAL ECHOCARDIOGRAM (TEE);  Surgeon: Lelon Perla, MD;  Location: [facility] ENDOSCOPY;  Service: Cardiovascular;  Laterality: N/A;   TONSILLECTOMY     AS A CHILD   TOTAL HIP ARTHROPLASTY Right 05/13/2012   Procedure: TOTAL HIP ARTHROPLASTY ANTERIOR APPROACH;  Surgeon: Gearlean Alf, MD;  Location: [facility];  Service: Orthopedics;  Laterality: Right;   Current Outpatient Medications:    acetaminophen (TYLENOL) 325 MG tablet, Take 2 tablets (650 mg total) by mouth every 6 (six) hours as needed for mild pain (or Fever >/= 101)., Disp: , Rfl:    ALPRAZolam (XANAX) 1 MG tablet, Take 1 mg by mouth at bedtime. , Disp: , Rfl:    apixaban (ELIQUIS) 5 MG TABS tablet, Take 1 tablet (5 mg total) by mouth 2 (two) times daily., Disp: 60 tablet, Rfl: 5   busPIRone (BUSPAR) 15 MG tablet, Take 15 mg by mouth 2 (two) times daily., Disp: , Rfl:    CREON 36000-114000 units CPEP capsule, TAKE 2 CAPSULES WITH THE FIRST BITE OF EACH MEAL AND 1 CAPSULE WITH THE FIRST BITE OF EACH SNACK, Disp: 720 capsule, Rfl: 0   diltiazem (CARDIZEM CD) 120 MG 24 hr capsule, Take 1 capsule (120 mg total) by mouth daily., Disp: 30 capsule, Rfl: 1   linaclotide (LINZESS) 290 MCG CAPS capsule, Take 1 capsule (290 mcg total) by mouth daily before breakfast., Disp: 30 capsule, Rfl: 0   montelukast (SINGULAIR) 10 MG tablet, TAKE 1 TABLET BY MOUTH EVERY DAY, Disp: 90 tablet, Rfl: 1   morphine (MSIR) 15 MG tablet, Take 1 tablet (15 mg total) by mouth every 6 (six) hours as needed for moderate pain or severe pain. Must last 30 days., Disp: 120 tablet, Rfl: 0   naloxone (NARCAN) nasal spray 4 mg/0.1 mL, Place 1 spray into the nose as needed for up to 365 doses (for opioid-induced respiratory depresssion). In case of emergency (overdose), spray once into each nostril. If no response within 3 minutes, repeat application and call [phone]., Disp: 1 each, Rfl: 0   omeprazole (PRILOSEC) 40 MG  capsule, TAKE 1 CAPSULE (40 MG TOTAL) BY MOUTH 2 (TWO) TIMES DAILY BEFORE A MEAL., Disp: 180 capsule, Rfl: 0   valACYclovir (VALTREX) 1000 MG tablet, TAKE TWO TABLETS BY MOUTH TWICE DAILY FOR ONE DAY FEVER BLISTER, Disp: 4 tablet, Rfl: 5    Family History  Problem Relation Age of Onset   Aneurysm Mother    Bipolar disorder Sister    Aneurysm Maternal  Grandmother    Breast cancer Paternal Grandmother 68   Bipolar disorder Grandchild    Anxiety disorder Grandchild    Depression Grandchild    Cancer Neg Hx      Social History   Tobacco Use   Smoking status: Never   Smokeless tobacco: Never  Vaping Use   Vaping Use: Never used  Substance Use Topics   Alcohol use: No    Alcohol/week: 0.0 standard drinks of alcohol   Drug use: Yes    Comment: prescribed morphine and xanax    Allergies as of 01/16/2022 - Review Complete 01/16/2022  Allergen Reaction Noted   Amoxicillin Hives 08/20/2014   Chlorhexidine gluconate Dermatitis and Hives 07/06/2016   Clindamycin Hives 08/20/2014   Codeine Hives 08/21/2011   Erythromycin Hives 07/06/2016   Penicillin g Hives 07/06/2016   Sulfa antibiotics Nausea And Vomiting and Hives 09/06/2011   Levofloxacin Hives 02/18/2018   Shellfish allergy Hives 10/08/2012   Decadron [dexamethasone] Other (See Comments) 09/20/2011   Mangifera indica Hives 07/20/2016   Papaya derivatives Hives 10/30/2012   Betadine [povidone iodine] Hives 09/06/2011   Clarithromycin Hives 09/08/2011   Other Hives 09/06/2011   Povidone-iodine Hives 09/06/2011   Prednisone Anxiety 08/18/2014    Review of Systems:    All systems reviewed and negative except where noted in HPI.   Physical Exam:  BP 114/74 (BP Location: Right Arm, Patient Position: Sitting, Cuff Size: Normal)   Pulse 74   Temp 98.2 F (36.8 C) (Oral)   Ht '5\' 4"'$  (1.626 m)   Wt 161 lb 2 oz (73.1 kg)   BMI 27.66 kg/m  No LMP recorded. Patient has had a hysterectomy.  General:   Alert,   Well-developed, well-nourished, pleasant and cooperative in NAD Head:  Normocephalic and atraumatic. Eyes:  Sclera clear, no icterus.   Conjunctiva pink. Ears:  Normal auditory acuity. Nose:  No deformity, discharge, or lesions. Mouth:  No deformity or lesions,oropharynx pink & moist. Neck:  Supple; no masses or thyromegaly. Lungs:  Respirations even and unlabored.  Clear throughout to auscultation.   No wheezes, crackles, or rhonchi. No acute distress. Heart:  Regular rate and rhythm; no murmurs, clicks, rubs, or gallops. Abdomen:  Normal bowel sounds. Soft, nontender, mildly distended, tympanic to percussion without masses, hepatosplenomegaly or hernias noted.  No guarding or rebound tenderness.   Rectal: Not performed Msk:  Symmetrical without gross deformities. Good, equal movement & strength bilaterally. Pulses:  Normal pulses noted. Extremities:  No clubbing or edema.  No cyanosis. Neurologic:  Alert and oriented x3;  grossly normal neurologically. Skin:  Intact without significant lesions or rashes. No jaundice. Psych:  Alert and cooperative. Normal mood and affect.  Imaging Studies: Reviewed  Assessment and Plan:   TIKI TUCCIARONE is a 61 y.o. female with history of A. fib/a flutter s/p ablation on Eliquis, s/p Nissen's fundoplication in 1610, s/p cholecystectomy is seen in consultation for symptoms of postprandial abdominal discomfort, bloating, alternating episodes of constipation and diarrhea, weight gain.  Patient is diagnosed with pancreatic atrophy and severe exocrine pancreatic insufficiency, pancreatic fecal elastase levels 107.  Patient's symptoms abdominal discomfort and bloating are likely combination of EPI and chronic constipation  Chronic constipation TSH normal Patient's diet is devoid of fiber and adequate intake of water Reiterated on high-fiber diet and fiber supplements, information provided Strongly advised patient to eliminate artificial sweeteners, ice tea  regular intake of starchy foods Advised her to cut back on MiraLAX to 2 capfuls daily Advised regular  exercise/walking to lose some weight   Moderate exocrine pancreatic insufficiency No evidence of pancreatic lesions based on cross-sectional imaging Continue Creon 36 K 1 to 2 capsules with each meal and 1 with snack  Iron deficiency anemia: Currently resolved S/p parenteral iron therapy EGD and colonoscopy are unremarkable Deferred video capsule endoscopy at this time   Follow up in 6 months   Cephas Darby, MD

## 2022-01-17 NOTE — Telephone Encounter (Signed)
Patient advised.

## 2022-01-20 ENCOUNTER — Ambulatory Visit (INDEPENDENT_AMBULATORY_CARE_PROVIDER_SITE_OTHER): Payer: Medicare Other | Admitting: Family Medicine

## 2022-01-20 VITALS — BP 109/73 | HR 71 | Temp 98.6°F | Resp 16 | Ht 64.0 in | Wt 159.6 lb

## 2022-01-20 DIAGNOSIS — Z1231 Encounter for screening mammogram for malignant neoplasm of breast: Secondary | ICD-10-CM

## 2022-01-20 DIAGNOSIS — Z0000 Encounter for general adult medical examination without abnormal findings: Secondary | ICD-10-CM

## 2022-01-20 DIAGNOSIS — L989 Disorder of the skin and subcutaneous tissue, unspecified: Secondary | ICD-10-CM | POA: Diagnosis not present

## 2022-01-20 DIAGNOSIS — Z23 Encounter for immunization: Secondary | ICD-10-CM

## 2022-01-20 DIAGNOSIS — I10 Essential (primary) hypertension: Secondary | ICD-10-CM

## 2022-01-20 DIAGNOSIS — F332 Major depressive disorder, recurrent severe without psychotic features: Secondary | ICD-10-CM | POA: Diagnosis not present

## 2022-01-20 NOTE — Assessment & Plan Note (Signed)
New problem Just appears to be a small papule today, but she describes it as becoming inflamed and never healing Concern for possible SCC She is already established with [facility] dermatology, so she will call them for follow-up appointment

## 2022-01-20 NOTE — Assessment & Plan Note (Signed)
Chronic and well-controlled Followed by psychiatry

## 2022-01-20 NOTE — Assessment & Plan Note (Addendum)
Well controlled Continue current medications Reviewed recent metabolic panel F/u in 6 months  

## 2022-01-20 NOTE — Progress Notes (Signed)
I,Sulibeya S Dimas,acting as a Education administrator for Lavon Paganini, MD.,have documented all relevant documentation on the behalf of Lavon Paganini, MD,as directed by  Lavon Paganini, MD while in the presence of Lavon Paganini, MD.    Annual Wellness Visit     Patient: [name], Female    DOB: 1960/11/12, 61 y.o.   MRN: [id number] Visit Date: 01/20/2022  Today's Provider: Lavon Paganini, MD   Chief Complaint  Patient presents with   Medicare Wellness   skin check   Subjective    KALENE CUTLER is a 61 y.o. female who presents today for her Annual Wellness Visit. She reports consuming a general diet. The patient does not participate in regular exercise at present. She generally feels well. She reports sleeping well. She does have additional problems to discuss today.   Last AWV 02/08/21 HPI Patient has a couple on spots on her left arm and shoulders. Patient concerned spots are skin cancer. Has been watching it for over a year. Will bleed and hurt and then get smaller and then come back  Medications: Outpatient Medications Prior to Visit  Medication Sig   acetaminophen (TYLENOL) 325 MG tablet Take 2 tablets (650 mg total) by mouth every 6 (six) hours as needed for mild pain (or Fever >/= 101).   ALPRAZolam (XANAX) 1 MG tablet Take 1 mg by mouth at bedtime.    apixaban (ELIQUIS) 5 MG TABS tablet Take 1 tablet (5 mg total) by mouth 2 (two) times daily.   busPIRone (BUSPAR) 15 MG tablet Take 15 mg by mouth 2 (two) times daily.   CREON 36000-114000 units CPEP capsule TAKE 2 CAPSULES WITH THE FIRST BITE OF EACH MEAL AND 1 CAPSULE WITH THE FIRST BITE OF EACH SNACK   dexlansoprazole (DEXILANT) 60 MG capsule Take 1 capsule (60 mg total) by mouth daily.   diltiazem (CARDIZEM CD) 120 MG 24 hr capsule Take 1 capsule (120 mg total) by mouth daily.   linaclotide (LINZESS) 290 MCG CAPS capsule Take 1 capsule (290 mcg total) by mouth daily before breakfast.   montelukast  (SINGULAIR) 10 MG tablet TAKE 1 TABLET BY MOUTH EVERY DAY   naloxone (NARCAN) nasal spray 4 mg/0.1 mL Place 1 spray into the nose as needed for up to 365 doses (for opioid-induced respiratory depresssion). In case of emergency (overdose), spray once into each nostril. If no response within 3 minutes, repeat application and call [phone].   valACYclovir (VALTREX) 1000 MG tablet TAKE TWO TABLETS BY MOUTH TWICE DAILY FOR ONE DAY FEVER BLISTER   morphine (MSIR) 15 MG tablet Take 1 tablet (15 mg total) by mouth every 6 (six) hours as needed for moderate pain or severe pain. Must last 30 days.   No facility-administered medications prior to visit.    Allergies  Allergen Reactions   Amoxicillin Hives    She did ok w ANCEF   Chlorhexidine Gluconate Dermatitis and Hives   Clindamycin Hives   Codeine Hives   Erythromycin Hives    "mycins" in general   Penicillin G Hives    "cillins" in general   Sulfa Antibiotics Nausea And Vomiting and Hives        Levofloxacin Hives   Shellfish Allergy Hives   Decadron [Dexamethasone] Other (See Comments)    Hot flashes, insomnia, "manic" Hot flashes, insomnia, "manic"   Mangifera Indica Hives    papaya   Papaya Derivatives Hives   Betadine [Povidone Iodine] Hives        Clarithromycin Hives  Other Hives     Mango    Povidone-Iodine Hives   Prednisone Anxiety    High blood pressure, flushed, mood changes, heart palpitations High blood pressure, flushed, mood changes, heart palpitations    Patient Care Team: Virginia Crews, MD as PCP - General (Family Medicine) Vickie Epley, MD as PCP - Electrophysiology (Cardiology) Sisk, Montine Circle, NP as Nurse Practitioner (Psychiatry) Lucilla Lame, MD as Consulting Physician (Gastroenterology) Jules Husbands, MD as Consulting Physician (General Surgery) Milinda Pointer, MD as Referring Physician (Pain Medicine) Pcp, No Coralyn Helling, DMD (Dentistry) Neldon Labella, RN as Case  Manager Cammie Sickle, MD as Consulting Physician (Internal Medicine)  Review of Systems  Constitutional:  Positive for activity change.  Gastrointestinal:  Positive for abdominal distention and abdominal pain.  Endocrine: Positive for heat intolerance.  Musculoskeletal:  Positive for arthralgias, back pain, gait problem, myalgias, neck pain and neck stiffness.    Last CBC Lab Results  Component Value Date   WBC 6.6 01/07/2022   HGB 12.3 01/07/2022   HCT 36.7 01/07/2022   MCV 85.2 01/07/2022   MCH 28.5 01/07/2022   RDW 12.7 01/07/2022   PLT 286 16/12/9602   Last metabolic panel Lab Results  Component Value Date   GLUCOSE 105 (H) 01/07/2022   NA 139 01/07/2022   K 3.8 01/07/2022   CL 109 01/07/2022   CO2 24 01/07/2022   BUN 12 01/07/2022   CREATININE 0.82 01/07/2022   GFRNONAA >60 01/07/2022   CALCIUM 8.1 (L) 01/07/2022   PROT 6.0 (L) 01/04/2022   ALBUMIN 3.3 (L) 01/04/2022   LABGLOB 2.3 01/19/2021   AGRATIO 1.7 01/19/2021   BILITOT 0.5 01/04/2022   ALKPHOS 87 01/04/2022   AST 12 (L) 01/04/2022   ALT 10 01/04/2022   ANIONGAP 6 01/07/2022   Last lipids Lab Results  Component Value Date   CHOL 156 08/05/2021   HDL 56 08/05/2021   LDLCALC 85 08/05/2021   TRIG 74 08/05/2021   CHOLHDL 2.8 08/05/2021   Last hemoglobin A1c Lab Results  Component Value Date   HGBA1C 5.5 01/19/2021   Last thyroid functions Lab Results  Component Value Date   TSH 1.424 07/10/2019   Last vitamin D Lab Results  Component Value Date   25OHVITD2 9.8 11/11/2018   25OHVITD3 13 11/11/2018   Last vitamin B12 and Folate Lab Results  Component Value Date   VITAMINB12 241 01/14/2021   FOLATE 14.9 01/14/2021        Objective    Vitals: BP 109/73 (BP Location: Left Arm, Patient Position: Sitting, Cuff Size: Normal)   Pulse 71   Temp 98.6 F (37 C) (Oral)   Resp 16   Ht '5\' 4"'$  (1.626 m)   Wt 159 lb 9.6 oz (72.4 kg)   BMI 27.40 kg/m  BP Readings from Last 3  Encounters:  01/20/22 109/73  01/16/22 114/74  01/07/22 108/78   Wt Readings from Last 3 Encounters:  01/20/22 159 lb 9.6 oz (72.4 kg)  01/16/22 161 lb 2 oz (73.1 kg)  01/03/22 157 lb 10.1 oz (71.5 kg)       Physical Exam Vitals reviewed.  Constitutional:      General: She is not in acute distress.    Appearance: Normal appearance. She is well-developed. She is not diaphoretic.  HENT:     Head: Normocephalic and atraumatic.     Right Ear: Tympanic membrane, ear canal and external ear normal.     Left Ear: Tympanic membrane,  ear canal and external ear normal.     Nose: Nose normal.     Mouth/Throat:     Mouth: Mucous membranes are moist.     Pharynx: Oropharynx is clear. No oropharyngeal exudate.  Eyes:     General: No scleral icterus.    Conjunctiva/sclera: Conjunctivae normal.     Pupils: Pupils are equal, round, and reactive to light.  Neck:     Thyroid: No thyromegaly.  Cardiovascular:     Rate and Rhythm: Normal rate and regular rhythm.     Pulses: Normal pulses.     Heart sounds: Normal heart sounds. No murmur heard. Pulmonary:     Effort: Pulmonary effort is normal. No respiratory distress.     Breath sounds: Normal breath sounds. No wheezing or rales.  Abdominal:     General: There is no distension.     Palpations: Abdomen is soft.     Tenderness: There is no abdominal tenderness.  Musculoskeletal:        General: No deformity.     Cervical back: Neck supple.     Right lower leg: No edema.     Left lower leg: No edema.  Lymphadenopathy:     Cervical: No cervical adenopathy.  Skin:    General: Skin is warm and dry.     Findings: No rash.     Comments: Small papule on L forearm  Neurological:     Mental Status: She is alert and oriented to person, place, and time. Mental status is at baseline.     Sensory: No sensory deficit.     Motor: No weakness.     Gait: Gait normal.  Psychiatric:        Mood and Affect: Mood normal.        Behavior: Behavior  normal.        Thought Content: Thought content normal.      Most recent functional status assessment:    01/20/2022   10:32 AM  In your present state of health, do you have any difficulty performing the following activities:  Hearing? 0  Vision? 0  Difficulty concentrating or making decisions? 0  Walking or climbing stairs? 1  Dressing or bathing? 0  Doing errands, shopping? 1   Most recent fall risk assessment:    01/20/2022   10:32 AM  Fall Risk   Falls in the past year? 0  Number falls in past yr: 0  Injury with Fall? 0  Risk for fall due to : No Fall Risks  Follow up Falls evaluation completed    Most recent depression screenings:    01/20/2022   10:32 AM 03/24/2021   12:53 PM  PHQ 2/9 Scores  PHQ - 2 Score 0 0  PHQ- 9 Score 2    Most recent cognitive screening:    01/20/2022   10:32 AM  6CIT Screen  What Year? 0 points  What month? 0 points  What time? 0 points  Count back from 20 0 points  Months in reverse 0 points  Repeat phrase 2 points  Total Score 2 points   Most recent Audit-C alcohol use screening    01/20/2022   10:32 AM  Alcohol Use Disorder Test (AUDIT)  1. How often do you have a drink containing alcohol? 0  2. How many drinks containing alcohol do you have on a typical day when you are drinking? 0  3. How often do you have six or more drinks on one occasion? 0  AUDIT-C Score 0   A score of 3 or more in women, and 4 or more in men indicates increased risk for alcohol abuse, EXCEPT if all of the points are from question 1   No results found for any visits on 01/20/22.  Assessment & Plan     Annual wellness visit done today including the all of the following: Reviewed patient's Family Medical History Reviewed and updated list of patient's medical providers Assessment of cognitive impairment was done Assessed patient's functional ability Established a written schedule for health screening [place] Completed  and Reviewed  Exercise Activities and Dietary recommendations  Goals       "I am dealing with so much stress with my son and grandchildren" (pt-stated)      Current Barriers:  Chronic Mental Health needs related to anxiety and depression Mental Health Concerns  Family and relationship dysfunction Lacks knowledge of community resource: community resources to assist with financial difficulties Suicidal Ideation/Homicidal Ideation: No  Clinical Social Work Goal(s):  Over the next  90 days, patient will work with SW bi-weekly by telephone or in person to reduce or manage symptoms of anxiety and depression related to her parenting responsibilities of her 3 grandchildren and dysfunctional relationship with her son   Interventions:  Continued to explore relationship with her son explored, which remains conflictual Explored progress of managing home schooling with her 3 grandchildren, with patient acknowledging no new improvements . Per patient, experience remains the same Followed up on information provided  on [place] [phone] www. [place]org and Grandparents Raising Grandchildren meets every 3rd Wednesday at 11 am at the [facility] [phone] Patient confirmed that she has not contacted the resources provided Patient encouraged to contact resources provided for added support Patient encouraged to utilized resources provided and to call this social worker with any questions or concerns regarding her mental health or community resource needs.   Patient Self Care Activities:  Performs ADL's independently Performs IADL's independently Ability for insight  Patient Coping Strengths:  Family Able to Communicate Effectively  Patient Self Care Deficits:  Difficulties in establishing and maintaining boundaries with her adult son  Please see past updates related to this goal by clicking on the "Past Updates" button in the selected goal        Activity and Exercise Increased       Evidence-based guidance:  Review current exercise levels.  Assess patient perspective on exercise or activity level, barriers to increasing activity, motivation and readiness for change.  Recommend or set healthy exercise goal based on individual tolerance.  Encourage small steps toward making change in amount of exercise or activity.  Urge reduction of sedentary activities or screen time.  Promote group activities within the community or with family or support person.  Consider referral to rehabiliation therapist for assessment and exercise/activity plan.   Notes:       Increase water intake      Recommend increasing fluid intake to 4-6 glasses each day      LIFESTYLE - DECREASE FALLS RISK      Recommend to remove any items from the home that may cause slips or trips.        Immunization History  Administered Date(s) Administered   Influenza,inj,Quad PF,6+ Mos 12/30/2015, 12/07/2016, 12/25/2017, 12/27/2018, 01/13/2020, 01/16/2021, 01/20/2022   PFIZER(Purple Top)SARS-COV-2 Vaccination 06/06/2019, 07/04/2019, 12/22/2019   Pneumococcal Polysaccharide-23 12/30/2015    Health Maintenance  Topic Date Due   TETANUS/TDAP  Never done   Zoster Vaccines- Shingrix (  1 of 2) Never done   COVID-19 Vaccine (4 - Pfizer risk series) 02/16/2020   Medicare Annual Wellness (AWV)  02/08/2022   MAMMOGRAM  04/22/2022   COLONOSCOPY (Pts 45-20yr Insurance coverage will need to be confirmed)  01/16/2026   INFLUENZA VACCINE  Completed   Hepatitis C Screening  Completed   HIV Screening  Completed   HPV VACCINES  Aged Out     Discussed health benefits of physical activity, and encouraged her to engage in regular exercise appropriate for her age and condition.    Problem List Items Addressed This Visit       Cardiovascular and Mediastinum   Hypertension    Well controlled Continue current medications Reviewed recent metabolic panel F/u in 6 months         Musculoskeletal and Integument    Non-healing skin lesion    New problem Just appears to be a small papule today, but she describes it as becoming inflamed and never healing Concern for possible SCC [facility] so she will call them for follow-up appointment        Other   Severe episode of recurrent major depressive disorder, without psychotic features ([place]    Chronic and well-controlled Followed by psychiatry      Other Visit Diagnoses     Encounter for annual physical exam    -  Primary   Essential hypertension       Screening mammogram for breast cancer       Relevant Orders   MM 3D SCREEN BREAST BILATERAL   Need for influenza vaccination       Relevant Orders   Flu Vaccine QUAD 636moM (Fluarix, Fluzone & Alfiuria Quad PF) (Completed)        Return in about 1 year (around 01/21/2023) for AWV, CPE.     I, AnLavon PaganiniMD, have reviewed all documentation for this visit. The documentation on 01/20/22 for the exam, diagnosis, procedures, and orders are all accurate and complete.   Mistina Coatney, AnDionne BucyMD, MPH [facility]

## 2022-02-01 ENCOUNTER — Ambulatory Visit
Admission: RE | Admit: 2022-02-01 | Discharge: 2022-02-01 | Disposition: A | Discharge status: Home / Self Care | Payer: Medicare Other | Source: Ambulatory Visit | Attending: Family Medicine | Admitting: Family Medicine

## 2022-02-01 ENCOUNTER — Other Ambulatory Visit: Payer: Self-pay | Admitting: Gastroenterology

## 2022-02-01 DIAGNOSIS — Z1231 Encounter for screening mammogram for malignant neoplasm of breast: Secondary | ICD-10-CM

## 2022-02-07 ENCOUNTER — Other Ambulatory Visit: Payer: Self-pay | Admitting: Gastroenterology

## 2022-02-11 NOTE — Progress Notes (Signed)
MRN : [id number]  [name] is a 61 y.o. (09-08-1960) female who presents with chief complaint of check circulation.  History of Present Illness:   The patient presents for follow up s/p coil embolization of a left SFA pseudoaneurysm on 01/06/2022.  She can still feel a lump which is a bit tender but overall she is improved   No outpatient medications have been marked as taking for the 02/13/22 encounter (Appointment) with Delana Meyer, Dolores Lory, MD.    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    Chronic headaches    Chronic, continuous use of opioids    DDD (degenerative disc disease)    CERVICAL AND LUMBAR-CHRONIC PAIN, RT HIP LABRAL TEAR   Depression    PT STATES A LOT OF STRESS IN HER LIFE   Dissociative disorder    Dizziness 04/22/2013   Duodenal ulcer with hemorrhage and perforation ([place]) 04/27/2003   Foot drop, right    FROM BACK SURGERY   GERD (gastroesophageal reflux disease)    H/O arthrodesis 08/18/2014   Headache(784.0)    AND NECK PAIN--STATES RECENT TEST SHOW CERVICAL DEGENERATION   History of blood transfusion    s/p back surgery   History of cardiac cath    a. 05/2019 Cath: Nl cors. EF 55-65%.   History of cardioversion    History of cervical spinal surgery 01/04/2015   History of kidney stones    Hypertension    Inverted T wave    Iron deficiency 02/01/2021   Mitral regurgitation    a. 07/2020 Echo: EF 60-65%, no rwma. Nl RV size/fxn. RVSP 39.83mHg. Mildly to mod dil LA. Mod MR; b. 07/2020 TEE: EF 55-60%. Lambl's excresence. Nl RV size/fxn. Midly dil LA. No LA/LAA  thrombus. Mild MR.   Narrowing of intervertebral disc space 08/18/2014   Currently on disability.    Orthostatic hypotension 04/22/2013   Pain    CHRONIC NECK AND BACK PAIN - LIMITED ROM NECK - S/P FUSIONS CERVICAL AND LUMBAR   Paroxysmal atrial fibrillation ([place] 2021   a. 07/2020 s/p PVI.   Pneumonia    PONV (postoperative nausea and vomiting)    PT GIVES HX OF N&V AND FEVER WITH SURGERIES YEARS AGO--BUT NO PROBLEMS WITH MORE RECENT SURGERIES--STATES NOT MALIGNANT HYPERTHERMIA   Postop Hyponatremia 05/14/2012   Postoperative anemia due to acute blood loss 05/14/2012   PTSD (post-traumatic stress disorder)    Right foot drop    Right hip arthralgia 08/18/2014   Status post surgery of right, and now needs left.    Sleep apnea 2021   does not have a cpap   Therapeutic opioid-induced constipation (OIC)    Typical atrial flutter ([facility]     Past Surgical History:  Procedure Laterality Date   ABDOMINAL HYSTERECTOMY     afib  05/2019   ANTERIOR CERVICAL DECOMP/DISCECTOMY FUSION N/A 10/30/2012   Procedure: ACDF C5-6, EXPLORATION AND HARDWARE REMOVAL C6-7;  Surgeon: DMelina Schools MD;  Location: [facility]  Service: Orthopedics;  Laterality: N/A;   ANTERIOR FUSION CERVICAL SPINE  MAY 2012   AT [place] N/A 08/06/2020   Procedure: ATRIAL FIBRILLATION ABLATION;  Surgeon: Vickie Epley, MD;  Location: [place] [facility];  Service: Cardiovascular;  Laterality: N/A;   BACK SURGERY  2009   LUMBAR FUSION WITH RODS    BREAST BIOPSY Right 2008   benign.- bx/clip   BREAST EXCISIONAL BIOPSY Left 1998   benign   BREAST REDUCTION SURGERY Bilateral 06/2016   CARDIAC ELECTROPHYSIOLOGY MAPPING AND ABLATION     CARDIOVERSION N/A 12/03/2019   Procedure: CARDIOVERSION;  Surgeon: Kate Sable, MD;  Location: [facility];  Service: Cardiovascular;  Laterality: N/A;   CARDIOVERSION N/A 08/01/2021   Procedure: CARDIOVERSION;  Surgeon: Kate Sable, MD;  Location: [facility];   Service: Cardiovascular;  Laterality: N/A;   CARDIOVERSION N/A 08/05/2021   Procedure: CARDIOVERSION;  Surgeon: Minna Merritts, MD;  Location: [facility];  Service: Cardiovascular;  Laterality: N/A;   CARPAL TUNNEL RELEASE  05-06-12   Right   CHOLECYSTECTOMY     COLONOSCOPY WITH PROPOFOL N/A 01/26/2017   Procedure: COLONOSCOPY WITH PROPOFOL;  Surgeon: Lucilla Lame, MD;  Location: [place];  Service: Endoscopy;  Laterality: N/A;   COLONOSCOPY WITH PROPOFOL N/A 01/16/2021   Procedure: COLONOSCOPY WITH PROPOFOL;  Surgeon: Lin Landsman, MD;  Location: [facility] ENDOSCOPY;  Service: Gastroenterology;  Laterality: N/A;   DIAGNOSTIC LAPAROSCOPIES - MULTIPLE FOR ENDOMETRIOSIS     ESOPHAGEAL DILATION N/A 01/26/2017   Procedure: ESOPHAGEAL DILATION;  Surgeon: Lucilla Lame, MD;  Location: [place];  Service: Endoscopy;  Laterality: N/A;   ESOPHAGEAL MANOMETRY N/A 05/30/2017   Procedure: ESOPHAGEAL MANOMETRY (EM);  Surgeon: Lucilla Lame, MD;  Location: [facility];  Service: Endoscopy;  Laterality: N/A;   ESOPHAGOGASTRODUODENOSCOPY (EGD) WITH PROPOFOL N/A 01/26/2017   Procedure: ESOPHAGOGASTRODUODENOSCOPY (EGD) WITH PROPOFOL;  Surgeon: Lucilla Lame, MD;  Location: [place];  Service: Endoscopy;  Laterality: N/A;   ESOPHAGOGASTRODUODENOSCOPY (EGD) WITH PROPOFOL N/A 01/30/2020   Procedure: ESOPHAGOGASTRODUODENOSCOPY (EGD) WITH PROPOFOL;  Surgeon: Lucilla Lame, MD;  Location: [place];  Service: Endoscopy;  Laterality: N/A;  sleep apnea COVID + 01-15-20   ESOPHAGOGASTRODUODENOSCOPY (EGD) WITH PROPOFOL N/A 01/15/2021   Procedure: ESOPHAGOGASTRODUODENOSCOPY (EGD) WITH PROPOFOL;  Surgeon: Lin Landsman, MD;  Location: [place];  Service: Gastroenterology;  Laterality: N/A;   ESOPHAGOGASTRODUODENOSCOPY (EGD) WITH PROPOFOL N/A 12/07/2021   Procedure: ESOPHAGOGASTRODUODENOSCOPY (EGD) WITH PROPOFOL;  Surgeon: Lin Landsman, MD;  Location: [facility]  ENDOSCOPY;  Service: Gastroenterology;  Laterality: N/A;   HIP ARTHROSCOPY  09/20/2011   Procedure: ARTHROSCOPY HIP;  Surgeon: Gearlean Alf, MD;  Location: [facility];  Service: Orthopedics;  Laterality: Right;  Right Hip Scope with Labral Debridement   LEFT HEART CATH AND CORONARY ANGIOGRAPHY Left 06/10/2019   Procedure: LEFT HEART CATH AND CORONARY ANGIOGRAPHY;  Surgeon: Nelva Bush, MD;  Location: [place] [facility];  Service: Cardiovascular;  Laterality: Left;   LOWER EXTREMITY ANGIOGRAPHY Left 01/06/2022   Procedure: Lower Extremity Angiography;  Surgeon: Katha Cabal, MD;  Location: [place] [facility];  Service: Cardiovascular;  Laterality: Left;   NASAL SEPTUM SURGERY  MARCH 2013   IN [place]   [facility] IMPEDANCE STUDY N/A 05/30/2017   Procedure: [place] IMPEDANCE STUDY;  Surgeon: Lucilla Lame, MD;  Location: [facility] ENDOSCOPY;  Service: Endoscopy;  Laterality: N/A;   POLYPECTOMY N/A 01/26/2017   Procedure: POLYPECTOMY;  Surgeon: Lucilla Lame, MD;  Location: [place];  Service: Endoscopy;  Laterality: N/A;   POSTERIOR CERVICAL FUSION/FORAMINOTOMY N/A 04/16/2013   Procedure: REMOVAL CERVICAL PLATES AND INTERBODY CAGE/POSTERIOR CERVICAL SPINAL FUSION C4 - C6/C5 CORPECTOMY/C4 - C6 FUSION WITH ILIAC CREST BONE GRAFT;  Surgeon: Melina Schools, MD;  Location: [place];  Service: Orthopedics;  Laterality: N/A;   RADIOFREQUENCY ABLATION NERVES     REDUCTION MAMMAPLASTY Bilateral 05/2016   RIGHT HIP ARTHROSCOPY FOR LABRAL TEAR  ABOUT 2010   2012 also   SHOULDER ARTHROSCOPY  05-06-12   bone spur   TEE WITHOUT CARDIOVERSION N/A 08/05/2020   Procedure: TRANSESOPHAGEAL ECHOCARDIOGRAM (TEE);  Surgeon: Lelon Perla, MD;  Location: [facility] ENDOSCOPY;  Service: Cardiovascular;  Laterality: N/A;   TONSILLECTOMY     AS A CHILD   TOTAL HIP ARTHROPLASTY Right 05/13/2012   Procedure: TOTAL HIP ARTHROPLASTY ANTERIOR APPROACH;  Surgeon: Gearlean Alf, MD;  [facility];  Service: Orthopedics;   Laterality: Right;    Social History Social History   Tobacco Use   Smoking status: Never   Smokeless tobacco: Never  Vaping Use   Vaping Use: Never used  Substance Use Topics   Alcohol use: No    Alcohol/week: 0.0 standard drinks of alcohol   Drug use: Yes    Comment: prescribed morphine and xanax    Family History Family History  Problem Relation Age of Onset   Aneurysm Mother    Bipolar disorder Sister    Aneurysm Maternal Grandmother    Breast cancer Paternal Grandmother 52   Bipolar disorder Grandchild    Anxiety disorder Grandchild    Depression Grandchild    Cancer Neg Hx     Allergies  Allergen Reactions   Amoxicillin Hives    She did ok w ANCEF   Chlorhexidine Gluconate Dermatitis and Hives   Clindamycin Hives   Codeine Hives   Erythromycin Hives    "mycins" in general   Penicillin G Hives    "cillins" in general   Sulfa Antibiotics Nausea And Vomiting and Hives        Levofloxacin Hives   Shellfish Allergy Hives   Decadron [Dexamethasone] Other (See Comments)    Hot flashes, insomnia, "manic" Hot flashes, insomnia, "manic"   Mangifera Indica Hives    papaya   Papaya Derivatives Hives   Betadine [Povidone Iodine] Hives        Clarithromycin Hives   Other Hives     Mango    Povidone-Iodine Hives   Prednisone Anxiety    High blood pressure, flushed, mood changes, heart palpitations High blood pressure, flushed, mood changes, heart palpitations     REVIEW OF SYSTEMS (Negative unless checked)  Constitutional: '[]'$ Weight loss  '[]'$ Fever  '[]'$ Chills Cardiac: '[]'$ Chest pain   '[]'$ Chest pressure   '[]'$ Palpitations   '[]'$ Shortness of breath when laying flat   '[]'$ Shortness of breath with exertion. Vascular:  '[x]'$ Pain in legs with walking   '[]'$ Pain in legs at rest  '[]'$ History of DVT   '[]'$ Phlebitis   '[]'$ Swelling in legs   '[]'$ Varicose veins   '[]'$ Non-healing ulcers Pulmonary:   '[]'$ Uses home oxygen   '[]'$ Productive cough   '[]'$ Hemoptysis   '[]'$ Wheeze  '[]'$ COPD    '[x]'$ Asthma Neurologic:  '[]'$ Dizziness   '[]'$ Seizures   '[]'$ History of stroke   '[]'$ History of TIA  '[]'$ Aphasia   '[]'$ Vissual changes   '[]'$ Weakness or numbness in arm   '[]'$ Weakness or numbness in leg Musculoskeletal:   '[]'$ Joint swelling   '[x]'$ Joint pain   '[x]'$ Low back pain Hematologic:  '[]'$ Easy bruising  '[]'$ Easy bleeding   '[]'$   Hypercoagulable state   '[]'$ Anemic Gastrointestinal:  '[]'$ Diarrhea   '[]'$ Vomiting  '[x]'$ Gastroesophageal reflux/heartburn   '[]'$ Difficulty swallowing. Genitourinary:  '[]'$ Chronic kidney disease   '[]'$ Difficult urination  '[]'$ Frequent urination   '[]'$ Blood in urine Skin:  '[]'$ Rashes   '[]'$ Ulcers  Psychological:  '[]'$ History of anxiety   '[]'$  History of major depression.  Physical Examination  There were no vitals filed for this visit. There is no height or weight on file to calculate BMI. Gen: WD/WN, NAD Head: [place]/AT, No temporalis wasting.  Ear/Nose/Throat: Hearing grossly intact, nares w/o erythema or drainage Eyes: PER, EOMI, sclera nonicteric.  Neck: Supple, no masses.  No bruit or JVD.  Pulmonary:  Good air movement, no audible wheezing, no use of accessory muscles.  Cardiac: RRR, normal S1, S2, no Murmurs. Vascular:   Vessel Right Left  Radial Palpable Palpable  Gastrointestinal: soft, non-distended. No guarding/no peritoneal signs.  Musculoskeletal: M/S 5/5 throughout.  No visible deformity.  Neurologic: CN 2-12 intact. Pain and light touch intact in extremities.  Symmetrical.  Speech is fluent. Motor exam as listed above. Psychiatric: Judgment intact, Mood & affect appropriate for pt's clinical situation. Dermatologic: No rashes or ulcers noted.  No changes consistent with cellulitis.   CBC Lab Results  Component Value Date   WBC 6.6 01/07/2022   HGB 12.3 01/07/2022   HCT 36.7 01/07/2022   MCV 85.2 01/07/2022   PLT 286 01/07/2022    BMET    Component Value Date/Time   NA 139 01/07/2022 0420   NA 140 01/19/2021 1055   NA 140 02/26/2013 1315   K 3.8 01/07/2022 0420   K 4.0 02/26/2013 1315    CL 109 01/07/2022 0420   CL 108 (H) 02/26/2013 1315   CO2 24 01/07/2022 0420   CO2 27 02/26/2013 1315   GLUCOSE 105 (H) 01/07/2022 0420   GLUCOSE 95 02/26/2013 1315   BUN 12 01/07/2022 0420   BUN 7 (L) 01/19/2021 1055   BUN 13 02/26/2013 1315   CREATININE 0.82 01/07/2022 0420   CREATININE 0.85 12/07/2016 1228   CALCIUM 8.1 (L) 01/07/2022 0420   CALCIUM 9.2 02/26/2013 1315   GFRNONAA >60 01/07/2022 0420   GFRNONAA >60 02/26/2013 1315   GFRAA >60 11/26/2019 1213   GFRAA >60 02/26/2013 1315   CrCl cannot be calculated (Patient's most recent lab result is older than the maximum 21 days allowed.).  COAG Lab Results  Component Value Date   INR 1.2 01/03/2022   INR 1.6 (H) 08/05/2021   INR 1.2 02/20/2020    Radiology MM 3D SCREEN BREAST BILATERAL  Result Date: 02/06/2022 CLINICAL DATA:  Screening. EXAM: DIGITAL SCREENING BILATERAL MAMMOGRAM WITH TOMOSYNTHESIS AND CAD TECHNIQUE: Bilateral screening digital craniocaudal and mediolateral oblique mammograms were obtained. Bilateral screening digital breast tomosynthesis was performed. The images were evaluated with computer-aided detection. COMPARISON:  Previous exam(s). ACR Breast Density Category b: There are scattered areas of fibroglandular density. FINDINGS: There are no findings suspicious for malignancy. IMPRESSION: No mammographic evidence of malignancy. A result letter of this screening mammogram will be mailed directly to the patient. RECOMMENDATION: Screening mammogram in one year. (Code:SM-B-01Y) BI-RADS CATEGORY  1: Negative. Electronically Signed   By: Kristopher Oppenheim M.D.   On: 02/06/2022 13:32     Assessment/Plan 1. Pseudoaneurysm of left femoral artery ([place]) Essentially the problem is resolved  If the coils continue to be tender they could be remvoed but only with surgery  She will follow up PRN  2. Paroxysmal atrial fibrillation ([facility]) Continue antiarrhythmia medications as already  ordered, these medications have been  reviewed and there are no changes at this time.  It is fine to access the left groin in the future if needed but I recommend more proximally in the area of the common femoral vein and artery.  Continue anticoagulation as ordered by Cardiology Service  3. Primary hypertension Continue antihypertensive medications as already ordered, these medications have been reviewed and there are no changes at this time.  4. Mild intermittent asthma, unspecified whether complicated Continue pulmonary medications and aerosols as already ordered, these medications have been reviewed and there are no changes at this time.   5. DDD (degenerative disc disease), lumbosacral Continue NSAID medications as already ordered, these medications have been reviewed and there are no changes at this time.  Continued activity and therapy was stressed.  6. Pseudoaneurysm of femoral artery ([place]) See #1 - VAS [place] GROIN PSEUDOANEURYSM    Hortencia Pilar, MD  02/11/2022 3:46 PM

## 2022-02-13 ENCOUNTER — Ambulatory Visit (INDEPENDENT_AMBULATORY_CARE_PROVIDER_SITE_OTHER): Payer: Medicare Other

## 2022-02-13 ENCOUNTER — Encounter (INDEPENDENT_AMBULATORY_CARE_PROVIDER_SITE_OTHER): Payer: Self-pay | Admitting: Vascular Surgery

## 2022-02-13 ENCOUNTER — Ambulatory Visit (INDEPENDENT_AMBULATORY_CARE_PROVIDER_SITE_OTHER): Payer: Medicare Other | Admitting: Vascular Surgery

## 2022-02-13 ENCOUNTER — Other Ambulatory Visit (INDEPENDENT_AMBULATORY_CARE_PROVIDER_SITE_OTHER): Payer: Self-pay | Admitting: Vascular Surgery

## 2022-02-13 VITALS — BP 104/69 | HR 68 | Resp 16 | Ht 64.0 in | Wt 157.0 lb

## 2022-02-13 DIAGNOSIS — I10 Essential (primary) hypertension: Secondary | ICD-10-CM

## 2022-02-13 DIAGNOSIS — I724 Aneurysm of artery of lower extremity: Secondary | ICD-10-CM

## 2022-02-13 DIAGNOSIS — J4520 Mild intermittent asthma, uncomplicated: Secondary | ICD-10-CM

## 2022-02-13 DIAGNOSIS — M5137 Other intervertebral disc degeneration, lumbosacral region: Secondary | ICD-10-CM

## 2022-02-13 DIAGNOSIS — I480 Paroxysmal atrial fibrillation: Secondary | ICD-10-CM

## 2022-02-19 ENCOUNTER — Encounter (INDEPENDENT_AMBULATORY_CARE_PROVIDER_SITE_OTHER): Payer: Self-pay | Admitting: Vascular Surgery

## 2022-02-27 DIAGNOSIS — M25562 Pain in left knee: Secondary | ICD-10-CM | POA: Diagnosis not present

## 2022-02-27 DIAGNOSIS — S80211A Abrasion, right knee, initial encounter: Secondary | ICD-10-CM | POA: Diagnosis not present

## 2022-02-27 DIAGNOSIS — Z95828 Presence of other vascular implants and grafts: Secondary | ICD-10-CM | POA: Diagnosis not present

## 2022-02-27 DIAGNOSIS — Z79891 Long term (current) use of opiate analgesic: Secondary | ICD-10-CM | POA: Diagnosis not present

## 2022-02-27 DIAGNOSIS — M25552 Pain in left hip: Secondary | ICD-10-CM | POA: Diagnosis not present

## 2022-02-27 DIAGNOSIS — S8002XA Contusion of left knee, initial encounter: Secondary | ICD-10-CM | POA: Diagnosis not present

## 2022-02-27 DIAGNOSIS — M25522 Pain in left elbow: Secondary | ICD-10-CM | POA: Diagnosis not present

## 2022-02-27 DIAGNOSIS — I4891 Unspecified atrial fibrillation: Secondary | ICD-10-CM | POA: Diagnosis not present

## 2022-02-27 DIAGNOSIS — G8929 Other chronic pain: Secondary | ICD-10-CM | POA: Diagnosis not present

## 2022-02-27 DIAGNOSIS — Z9889 Other specified postprocedural states: Secondary | ICD-10-CM | POA: Diagnosis not present

## 2022-02-27 DIAGNOSIS — Z23 Encounter for immunization: Secondary | ICD-10-CM | POA: Diagnosis not present

## 2022-02-27 DIAGNOSIS — Z7901 Long term (current) use of anticoagulants: Secondary | ICD-10-CM | POA: Diagnosis not present

## 2022-02-27 DIAGNOSIS — S8012XA Contusion of left lower leg, initial encounter: Secondary | ICD-10-CM | POA: Diagnosis not present

## 2022-02-27 DIAGNOSIS — I729 Aneurysm of unspecified site: Secondary | ICD-10-CM | POA: Diagnosis not present

## 2022-03-01 ENCOUNTER — Other Ambulatory Visit: Payer: Self-pay | Admitting: Gastroenterology

## 2022-03-02 ENCOUNTER — Other Ambulatory Visit: Payer: Self-pay | Admitting: Family Medicine

## 2022-03-02 ENCOUNTER — Telehealth: Payer: Self-pay | Admitting: Family Medicine

## 2022-03-02 NOTE — Progress Notes (Signed)
I,Sulibeya S Dimas,acting as a scribe for Lavon Paganini, MD.,have documented all relevant documentation on the behalf of Lavon Paganini, MD,as directed by  Lavon Paganini, MD while in the presence of Lavon Paganini, MD.     Established patient visit   Patient: [name]   DOB: Nov 23, 1960   61 y.o. Female  MRN: [id number] Visit Date: 03/03/2022  Today's healthcare provider: Lavon Paganini, MD   Chief Complaint  Patient presents with   Follow-up   Subjective    HPI  Follow up ER visit  Patient was seen in ER for fall on 02/27/22. She was treated for Left knee and lower leg pain after fall while taking her dog out at 3am Treatment for this included x-rays - negative for fractures. She reports excellent compliance with treatment. She reports this condition is Unchanged. Patient reports taking tylenol '1000mg'$  BID, reports mild pain control.  States that the morphine she takes for chronic pain does not help this acute pain.  -----------------------------------------------------------------------------------------   Medications: Outpatient Medications Prior to Visit  Medication Sig   acetaminophen (TYLENOL) 325 MG tablet Take 2 tablets (650 mg total) by mouth every 6 (six) hours as needed for mild pain (or Fever >/= 101).   ALPRAZolam (XANAX) 1 MG tablet Take 1 mg by mouth at bedtime.    apixaban (ELIQUIS) 5 MG TABS tablet Take 1 tablet (5 mg total) by mouth 2 (two) times daily.   busPIRone (BUSPAR) 15 MG tablet Take 15 mg by mouth 2 (two) times daily.   CREON 36000-114000 units CPEP capsule TAKE 2 CAPSULES WITH THE FIRST BITE OF EACH MEAL AND 1 CAPSULE WITH THE FIRST BITE OF EACH SNACK   dexlansoprazole (DEXILANT) 60 MG capsule TAKE 1 CAPSULE BY MOUTH EVERY DAY   diltiazem (CARDIZEM CD) 120 MG 24 hr capsule Take 1 capsule (120 mg total) by mouth daily.   linaclotide (LINZESS) 290 MCG CAPS capsule TAKE 1 CAPSULE BY MOUTH DAILY BEFORE BREAKFAST.   montelukast  (SINGULAIR) 10 MG tablet TAKE 1 TABLET BY MOUTH EVERY DAY   morphine (MSIR) 15 MG tablet Take 1 tablet (15 mg total) by mouth every 6 (six) hours as needed for moderate pain or severe pain. Must last 30 days.   naloxone (NARCAN) nasal spray 4 mg/0.1 mL Place 1 spray into the nose as needed for up to 365 doses (for opioid-induced respiratory depresssion). In case of emergency (overdose), spray once into each nostril. If no response within 3 minutes, repeat application and call 222.   valACYclovir (VALTREX) 1000 MG tablet TAKE TWO TABLETS BY MOUTH TWICE DAILY FOR ONE DAY FEVER BLISTER   No facility-administered medications prior to visit.    Review of Systems  Constitutional:  Negative for appetite change and fatigue.  Respiratory:  Negative for chest tightness and shortness of breath.   Cardiovascular:  Negative for chest pain.  Gastrointestinal:  Negative for abdominal pain, nausea and vomiting.  Musculoskeletal:        Left leg pain       Objective    BP 118/88 (BP Location: Left Arm, Patient Position: Sitting, Cuff Size: Large)   Pulse (!) 128   Temp 98.5 F (36.9 C) (Oral)   Resp 16   Wt 160 lb 14.4 oz (73 kg)   BMI 27.62 kg/m  BP Readings from Last 3 Encounters:  03/03/22 118/88  02/13/22 104/69  01/20/22 109/73   Wt Readings from Last 3 Encounters:  03/03/22 160 lb 14.4 oz (73 kg)  02/13/22 157 lb (71.2 kg)  01/20/22 159 lb 9.6 oz (72.4 kg)      Physical Exam Vitals reviewed.  Constitutional:      General: She is not in acute distress.    Appearance: She is well-developed.  HENT:     Head: Normocephalic and atraumatic.  Eyes:     General: No scleral icterus.    Conjunctiva/sclera: Conjunctivae normal.  Cardiovascular:     Rate and Rhythm: Normal rate and regular rhythm.  Pulmonary:     Effort: Pulmonary effort is normal. No respiratory distress.  Skin:    General: Skin is warm and dry.     Findings: No rash.     Comments: Hematoma of L shin   Neurological:     Mental Status: She is alert and oriented to person, place, and time.  Psychiatric:        Behavior: Behavior normal.       No results found for any visits on 03/03/22.  Assessment & Plan     Problem List Items Addressed This Visit   None Visit Diagnoses     Hematoma    -  Primary   Left leg pain          - healing appropriately - advised on natural course - ok to use heating pad - pain management is managing all opioids - continue tylenol prn   Patient is out of Eliquis due to an error at the pharmacy - 1 wk samples given to patient.  Return for as scheduled.      I, Lavon Paganini, MD, have reviewed all documentation for this visit. The documentation on 03/03/22 for the exam, diagnosis, procedures, and orders are all accurate and complete.   Randal Goens, Dionne Bucy, MD, MPH [facility]

## 2022-03-02 NOTE — Telephone Encounter (Signed)
Copied from [place] [id number]. Topic: General - Other >> Mar 02, 2022  9:24 AM [name] wrote: Reason for CRM: The patient has 1 remaining tablet of apixaban (ELIQUIS) 5 MG TABS tablet [[id number]]  The patient is concerned with getting their refill the prescription   The patient has been told by their pharmacy that their refill cannot be filled until 03/11/22 which does not line up with their previous dates of getting their prescription   The patient would like to discuss their concerns with a member of clinical staff further when possible

## 2022-03-02 NOTE — Telephone Encounter (Unsigned)
Copied from [place] [id number]. Topic: General - Other >> Mar 02, 2022  9:21 AM Everette C wrote: Reason for CRM: Medication Refill - Medication: apixaban (ELIQUIS) 5 MG TABS tablet [[id number]]  Has the patient contacted their pharmacy? Yes.  The patient has been directed to contact their PCP  (Agent: If no, request that the patient contact the pharmacy for the refill. If patient does not wish to contact the pharmacy document the reason why and proceed with request.) (Agent: If yes, when and what did the pharmacy advise?)  Preferred Pharmacy (with phone number or street name): [id number] [place] [address]Phone: [phone]: [phone]: Not open 24 hours   Has the patient been seen for an appointment in the last year OR does the patient have an upcoming appointment? Yes.    Agent: Please be advised that RX refills may take up to 3 business days. We ask that you follow-up with your pharmacy.

## 2022-03-03 ENCOUNTER — Encounter: Payer: Self-pay | Admitting: Family Medicine

## 2022-03-03 ENCOUNTER — Ambulatory Visit (INDEPENDENT_AMBULATORY_CARE_PROVIDER_SITE_OTHER): Payer: Medicare Other | Admitting: Family Medicine

## 2022-03-03 VITALS — BP 118/88 | HR 128 | Temp 98.5°F | Resp 16 | Wt 160.9 lb

## 2022-03-03 DIAGNOSIS — M79605 Pain in left leg: Secondary | ICD-10-CM

## 2022-03-03 DIAGNOSIS — T148XXA Other injury of unspecified body region, initial encounter: Secondary | ICD-10-CM | POA: Diagnosis not present

## 2022-03-03 MED ORDER — APIXABAN 5 MG PO TABS: 5.0000 mg | ORAL_TABLET | Freq: Two times a day (BID) | ORAL | 5 refills | Status: DC

## 2022-03-03 NOTE — Telephone Encounter (Signed)
Requested medication (s) are due for refill today: Yes  Requested medication (s) are on the active medication list: Yes  Last refill:  10/24/21  Future visit scheduled: Yes  Notes to clinic:  Last filled by Dr. Galen Manila.    Requested Prescriptions  Pending Prescriptions Disp Refills   apixaban (ELIQUIS) 5 MG TABS tablet 60 tablet 5    Sig: Take 1 tablet (5 mg total) by mouth 2 (two) times daily.     Hematology:  Anticoagulants - apixaban Failed - 03/02/2022  9:56 AM      Failed - AST in normal range and within 360 days    AST  Date Value Ref Range Status  01/04/2022 12 (L) 15 - 41 U/L Final   SGOT(AST)  Date Value Ref Range Status  02/26/2013 31 15 - 37 Unit/L Final         Passed - PLT in normal range and within 360 days    Platelets  Date Value Ref Range Status  01/07/2022 286 150 - 400 K/uL Final  12/25/2017 426 150 - 450 x10E3/uL Final         Passed - HGB in normal range and within 360 days    Hemoglobin  Date Value Ref Range Status  01/07/2022 12.3 12.0 - 15.0 g/dL Final  12/25/2017 12.1 11.1 - 15.9 g/dL Final         Passed - HCT in normal range and within 360 days    HCT  Date Value Ref Range Status  01/07/2022 36.7 36.0 - 46.0 % Final   Hematocrit  Date Value Ref Range Status  12/25/2017 37.1 34.0 - 46.6 % Final         Passed - Cr in normal range and within 360 days    Creat  Date Value Ref Range Status  12/07/2016 0.85 0.50 - 1.05 mg/dL Final    Comment:    For patients >64 years of age, the reference limit for Creatinine is approximately 13% higher for people identified as African-American. .    Creatinine, Ser  Date Value Ref Range Status  01/07/2022 0.82 0.44 - 1.00 mg/dL Final         Passed - ALT in normal range and within 360 days    ALT  Date Value Ref Range Status  01/04/2022 10 0 - 44 U/L Final   SGPT (ALT)  Date Value Ref Range Status  02/26/2013 23 12 - 78 U/L Final         Passed - Valid encounter within last 12  months    Recent Outpatient Visits           1 month ago Encounter for annual physical exam   [facility], Dionne Bucy, MD   1 year ago Encounter for Medicare annual wellness exam   [facility] [place], Dionne Bucy, MD   1 year ago [place], Dionne Bucy, MD   2 years ago Encounter for annual physical exam   [facility] [place], Dionne Bucy, MD   2 years ago Heart palpitations   [place], Wendee Beavers, [place]       Future Appointments             Today [place], Dionne Bucy, [place], [place]   In 10 months [place], Dionne Bucy, [place], [place]

## 2022-03-04 ENCOUNTER — Other Ambulatory Visit: Payer: Self-pay | Admitting: Family Medicine

## 2022-03-04 DIAGNOSIS — J45909 Unspecified asthma, uncomplicated: Secondary | ICD-10-CM

## 2022-03-15 ENCOUNTER — Encounter: Payer: Self-pay | Admitting: Pain Medicine

## 2022-03-15 ENCOUNTER — Ambulatory Visit: Payer: Medicare Other | Attending: Pain Medicine | Admitting: Pain Medicine

## 2022-03-15 VITALS — BP 161/95 | HR 70 | Temp 97.0°F | Ht 64.0 in | Wt 160.0 lb

## 2022-03-15 DIAGNOSIS — G8928 Other chronic postprocedural pain: Secondary | ICD-10-CM | POA: Diagnosis not present

## 2022-03-15 DIAGNOSIS — Z79899 Other long term (current) drug therapy: Secondary | ICD-10-CM | POA: Diagnosis not present

## 2022-03-15 DIAGNOSIS — M25511 Pain in right shoulder: Secondary | ICD-10-CM | POA: Diagnosis not present

## 2022-03-15 DIAGNOSIS — M25552 Pain in left hip: Secondary | ICD-10-CM | POA: Diagnosis not present

## 2022-03-15 DIAGNOSIS — M545 Low back pain, unspecified: Secondary | ICD-10-CM | POA: Insufficient documentation

## 2022-03-15 DIAGNOSIS — Z79891 Long term (current) use of opiate analgesic: Secondary | ICD-10-CM | POA: Diagnosis not present

## 2022-03-15 DIAGNOSIS — G8929 Other chronic pain: Secondary | ICD-10-CM | POA: Diagnosis not present

## 2022-03-15 DIAGNOSIS — M7918 Myalgia, other site: Secondary | ICD-10-CM | POA: Diagnosis not present

## 2022-03-15 DIAGNOSIS — M25551 Pain in right hip: Secondary | ICD-10-CM | POA: Insufficient documentation

## 2022-03-15 DIAGNOSIS — M47816 Spondylosis without myelopathy or radiculopathy, lumbar region: Secondary | ICD-10-CM | POA: Insufficient documentation

## 2022-03-15 DIAGNOSIS — G4486 Cervicogenic headache: Secondary | ICD-10-CM | POA: Diagnosis not present

## 2022-03-15 DIAGNOSIS — G894 Chronic pain syndrome: Secondary | ICD-10-CM | POA: Insufficient documentation

## 2022-03-15 DIAGNOSIS — M549 Dorsalgia, unspecified: Secondary | ICD-10-CM | POA: Diagnosis not present

## 2022-03-15 DIAGNOSIS — M542 Cervicalgia: Secondary | ICD-10-CM | POA: Diagnosis not present

## 2022-03-15 DIAGNOSIS — M961 Postlaminectomy syndrome, not elsewhere classified: Secondary | ICD-10-CM | POA: Insufficient documentation

## 2022-03-15 DIAGNOSIS — Z9889 Other specified postprocedural states: Secondary | ICD-10-CM | POA: Diagnosis not present

## 2022-03-15 DIAGNOSIS — M25512 Pain in left shoulder: Secondary | ICD-10-CM | POA: Insufficient documentation

## 2022-03-15 MED ORDER — MORPHINE SULFATE 15 MG PO TABS: 15.0000 mg | ORAL_TABLET | Freq: Four times a day (QID) | ORAL | 0 refills | Status: DC | PRN

## 2022-03-15 NOTE — Patient Instructions (Signed)
____________________________________________________________________________________________  Patient Information update  To: All of our patients.  Re: Name change.  It has been made official that our current name, "[facility]"   will soon be changed to "[facility]".   The purpose of this change is to eliminate any confusion created by the concept of our practice being a "Medication Management Pain Clinic". In the past this has led to the misconception that we treat pain primarily by the use of prescription medications.  Nothing can be farther from the truth.   Understanding PAIN MANAGEMENT: To further understand what our practice does, you first have to understand that "Pain Management" is a subspecialty that requires additional training once a physician has completed their specialty training, which can be in either Anesthesia, Neurology, Psychiatry, or Physical Medicine and Rehabilitation (PMR). Each one of these contributes to the final approach taken by each physician to the management of their patient's pain. To be a "Pain Management Specialist" you must have first completed one of the specialty trainings below.  Anesthesiologists - trained in clinical pharmacology and interventional techniques such as nerve blockade and regional as well as central neuroanatomy. They are trained to block pain before, during, and after surgical interventions.  Neurologists - trained in the diagnosis and pharmacological treatment of complex neurological conditions, such as Multiple Sclerosis, Parkinson's, spinal cord injuries, and other systemic conditions that may be associated with symptoms that may include but are not limited to pain. They tend to rely primarily on the treatment of chronic pain using prescription medications.  Psychiatrist - trained in conditions affecting the psychosocial  wellbeing of patients including but not limited to depression, anxiety, schizophrenia, personality disorders, addiction, and other substance use disorders that may be associated with chronic pain. They tend to rely primarily on the treatment of chronic pain using prescription medications.   Physical Medicine and Rehabilitation (PMR) physicians, also known as physiatrists - trained to treat a wide variety of medical conditions affecting the brain, spinal cord, nerves, bones, joints, ligaments, muscles, and tendons. Their training is primarily aimed at treating patients that have suffered injuries that have caused severe physical impairment. Their training is primarily aimed at the physical therapy and rehabilitation of those patients. They may also work alongside orthopedic surgeons or neurosurgeons using their expertise in assisting surgical patients to recover after their surgeries.  INTERVENTIONAL PAIN MANAGEMENT is sub-subspecialty of Pain Management.  Our physicians are Board-certified in Anesthesia, Pain Management, and Interventional Pain Management.  This meaning that not only have they been trained and Board-certified in their specialty of Anesthesia, and subspecialty of Pain Management, but they have also received further training in the sub-subspecialty of Interventional Pain Management, in order to become Board-certified as INTERVENTIONAL PAIN MANAGEMENT SPECIALIST.    Mission: Our goal is to use our skills in  INTERVENTIONAL PAIN MANAGEMENT as alternatives to the chronic use of prescription opioid medications for the treatment of pain. To make this more clear, we have changed our name to reflect what we do and offer. We will continue to offer medication management assessment and recommendations, but we will not be taking over any patient's medication management.  ____________________________________________________________________________________________      ____________________________________________________________________________________________  National Pain Medication Shortage  The [place] is experiencing worsening drug shortages. These have had a negative widespread effect on patient care and treatment. Not expected to improve any time soon. Predicted to last past 2029.   Drug shortage list (generic   names) Oxycodone IR Oxycodone/APAP Oxymorphone IR Hydromorphone Hydrocodone/APAP Morphine  Where is the problem?  Manufacturing and supply level.  Will this shortage affect you?  Only if you take any of the above pain medications.  How? You may be unable to fill your prescription.  Your pharmacist may offer a "partial fill" of your prescription. (Warning: Do not accept partial fills.) Prescriptions partially filled cannot be transferred to another pharmacy. Read our Medication Rules and Regulation. Depending on how much medicine you are dependent on, you may experience withdrawals when unable to get the medication.  Recommendations: Consider ending your dependence on opioid pain medications. Ask your pain specialist to assist you with the process. Consider switching to a medication currently not in shortage, such as Buprenorphine. Talk to your pain specialist about this option. Consider decreasing your pain medication requirements by managing tolerance thru "Drug Holidays". This may help minimize withdrawals, should you run out of medicine. Control your pain thru the use of non-pharmacological interventional therapies.   Your prescriber: Prescribers cannot be blamed for shortages. Medication manufacturing and supply issues cannot be fixed by the prescriber.   NOTE: The prescriber is not responsible for supplying the medication, or solving supply issues. Work with your pharmacist to solve it. The patient is responsible for the decision to take or continue taking the medication and for identifying and securing a legal supply source. By  law, supplying the medication is the job and responsibility of the pharmacy. The prescriber is responsible for the evaluation, monitoring, and prescribing of these medications.   Prescribers will NOT: Re-issue prescriptions that have been partially filled. Re-issue prescriptions already sent to a pharmacy.  Re-send prescriptions to a different pharmacy because yours did not have your medication. Ask pharmacist to order more medicine or transfer the prescription to another pharmacy. (Read below.)  New 2023 regulation: "November 11, 2021 Revised Regulation Allows DEA-Registered Pharmacies to Transfer Electronic Prescriptions at a Patient's Request DEA Headquarters Division - Public Information Office Patients now have the ability to request their electronic prescription be transferred to another pharmacy without having to go back to their practitioner to initiate the request. This revised regulation went into effect on Monday, November 07, 2021.     At a patient's request, a DEA-registered retail pharmacy can now transfer an electronic prescription for a controlled substance (schedules II-V) to another DEA-registered retail pharmacy. Prior to this change, patients would have to go through their practitioner to cancel their prescription and have it re-issued to a different pharmacy. The process was taxing and time consuming for both patients and practitioners.    The Drug Enforcement Administration (DEA) published its intent to revise the process for transferring electronic prescriptions on January 30, 2020.  The final rule was published in the federal register on October 06, 2021 and went into effect 30 days later.  Under the final rule, a prescription can only be transferred once between pharmacies, and only if allowed under existing state or other applicable law. The prescription must remain in its electronic form; may not be altered in any way; and the transfer must be communicated directly between  two licensed pharmacists. It's important to note, any authorized refills transfer with the original prescription, which means the entire prescription will be filled at the same pharmacy".  Reference: https://www.dea.gov/stories/2023/2023-11/2021-09-01/revised-regulation-allows-dea-registered-pharmacies-transfer (DEA website announcement)  https://www.govinfo.gov/content/pkg/FR-2021-10-06/pdf/2023-15847.pdf (Federal Register  Department of Justice)   Federal Register / Vol. 88, No. 143 / Thursday, October 06, 2021 / Rules and Regulations DEPARTMENT OF JUSTICE  Drug Enforcement   Administration  21 CFR Part 1306  [Docket No. [id number]]  RIN 1117-AB64 Transfer of Electronic Prescriptions for Schedules II-V Controlled Substances Between Pharmacies for Initial Filling  ____________________________________________________________________________________________     ____________________________________________________________________________________________  Drug Holidays  What is a "Drug Holiday"? Drug Holiday: is the name given to the process of slowly tapering down and temporarily stopping the pain medication for the purpose of decreasing or eliminating tolerance to the drug.  Benefits Improved effectiveness Decreased required effective dose Improved pain control End dependence on high dose therapy Decrease cost of therapy Uncovering "opioid-induced hyperalgesia". (OIH)  What is "opioid hyperalgesia"? It is a paradoxical increase in pain caused by exposure to opioids. Stopping the opioid pain medication, contrary to the expected, it actually decreases or completely eliminates the pain. Ref.: "A comprehensive review of opioid-induced hyperalgesia". Marion Lee, et.al. Pain Physician. 2011 Mar-Apr;14(2):145-61.  What is tolerance? Tolerance: the progressive loss of effectiveness of a pain medicine due to repetitive use. A common problem of opioid pain medications.  How long should a "Drug  Holiday" last? Effectiveness depends on the patient staying off all opioid pain medicines for a minimum of 14 consecutive days. (2 weeks)  How about just taking less of the medicine? Does not work. Will not accomplish goal of eliminating the excess receptors.  How about switching to a different pain medicine? (AKA. "Opioid rotation") Does not work. Creates the illusion of effectiveness by taking advantage of inaccurate equivalent dose calculations between different opioids. -This "technique" was promoted by studies funded by pharmaceutical companies, such as [employer], creators of "OxyContin".  Can I stop the medicine "cold turkey"? Depends. You should always coordinate with your Pain Specialist to make the transition as smoothly as possible. Avoid stopping the medicine abruptly without consulting. We recommend a "slow taper".  What is a slow taper? Taper: refers to the gradual decrease in dose.   How do I stop/taper the dose? Slowly. Decrease the daily amount of pills that you take by one (1) pill every seven (7) days. This is called a "slow downward taper". Example: if you normally take four (4) pills per day, drop it to three (3) pills per day for seven (7) days, then to two (2) pills per day for seven (7) days, then to one (1) per day for seven (7) days, and then stop the medicine. The 14 day "Drug Holiday" starts on the first day without medicine.   Will I experience withdrawals? Unlikely with a slow taper.  What triggers withdrawals? Withdrawals are triggered by the sudden/abrupt stop of high dose opioids. Withdrawals can be avoided by slowly decreasing the dose over a prolonged period of time.  What are withdrawals? Symptoms associated with sudden/abrupt reduction/stopping of high-dose, long-term use of pain medication. Withdrawal are seldom seen on low dose therapy, or patients rarely taking opioid medication.  Early Withdrawal Symptoms may include: Agitation Anxiety Muscle  aches Increased tearing Insomnia Runny nose Sweating Yawning  Late symptoms may include: Abdominal cramping Diarrhea Dilated pupils Goose bumps Nausea Vomiting  (Last update: 02/19/2022) ____________________________________________________________________________________________    _______________________________________________________________________  Medication Rules  Purpose: To inform patients, and their family members, of our medication rules and regulations.  Applies to: All patients receiving prescriptions from our practice (written or electronic).  Pharmacy of record: This is the pharmacy where your electronic prescriptions will be sent. Make sure we have the correct one.  Electronic prescriptions: In compliance with the Secor Strengthen Opioid Misuse Prevention (STOP) Act of 2017 (Session Law 2017-74/H243), effective March 13, 2018, all controlled substances must be electronically prescribed. Written prescriptions, faxing, or calling prescriptions to a pharmacy will no longer be done.  Prescription refills: These will be provided only during in-person appointments. No medications will be renewed without a "face-to-face" evaluation with your provider. Applies to all prescriptions.  NOTE: The following applies primarily to controlled substances (Opioid* Pain Medications).   Type of encounter (visit): For patients receiving controlled substances, face-to-face visits are required. (Not an option and not up to the patient.)  Patient's responsibilities: Pain Pills: Bring all pain pills to every appointment (except for procedure appointments). Pill Bottles: Bring pills in original pharmacy bottle. Bring bottle, even if empty. Always bring the bottle of the most recent fill.  Medication refills: You are responsible for knowing and keeping track of what medications you are taking and when is it that you will need a refill. The day before your appointment: write a  list of all prescriptions that need to be refilled. The day of the appointment: give the list to the admitting nurse. Prescriptions will be written only during appointments. No prescriptions will be written on procedure days. If you forget a medication: it will not be "Called in", "Faxed", or "electronically sent". You will need to get another appointment to get these prescribed. No early refills. Do not call asking to have your prescription filled early. Partial  or short prescriptions: Occasionally your pharmacy may not have enough pills to fill your prescription.  NEVER ACCEPT a partial fill or a prescription that is short of the total amount of pills that you were prescribed.  With controlled substances the law allows 72 hours for the pharmacy to complete the prescription.  If the prescription is not completed within 72 hours, the pharmacist will require a new prescription to be written. This means that you will be short on your medicine and we WILL NOT send another prescription to complete your original prescription.  Instead, request the pharmacy to send a carrier to a nearby branch to get enough medication to provide you with your full prescription. Prescription Accuracy: You are responsible for carefully inspecting your prescriptions before leaving our office. Have the discharge nurse carefully go over each prescription with you, before taking them home. Make sure that your name is accurately spelled, that your address is correct. Check the name and dose of your medication to make sure it is accurate. Check the number of pills, and the written instructions to make sure they are clear and accurate. Make sure that you are given enough medication to last until your next medication refill appointment. Taking Medication: Take medication as prescribed. When it comes to controlled substances, taking less pills or less frequently than prescribed is permitted and encouraged. Never take more pills than  instructed. Never take the medication more frequently than prescribed.  Inform other Doctors: Always inform, all of your healthcare providers, of all the medications you take. Pain Medication from other Providers: You are not allowed to accept any additional pain medication from any other Doctor or Healthcare provider. There are two exceptions to this rule. (see below) In the event that you require additional pain medication, you are responsible for notifying us, as stated below. Cough Medicine: Often these contain an opioid, such as codeine or hydrocodone. Never accept or take cough medicine containing these opioids if you are already taking an opioid* medication. The combination may cause respiratory failure and death. Medication Agreement: You are responsible for carefully reading and following our Medication Agreement. This   must be signed before receiving any prescriptions from our practice. Safely store a copy of your signed Agreement. Violations to the Agreement will result in no further prescriptions. (Additional copies of our Medication Agreement are available upon request.) Laws, Rules, & Regulations: All patients are expected to follow all Federal and State Laws, Statutes, Rules, & Regulations. Ignorance of the Laws does not constitute a valid excuse.  Illegal drugs and Controlled Substances: The use of illegal substances (including, but not limited to marijuana and its derivatives) and/or the illegal use of any controlled substances is strictly prohibited. Violation of this rule may result in the immediate and permanent discontinuation of any and all prescriptions being written by our practice. The use of any illegal substances is prohibited. Adopted CDC guidelines & recommendations: Target dosing levels will be at or below 60 MME/day. Use of benzodiazepines** is not recommended.  Exceptions: There are only two exceptions to the rule of not receiving pain medications from other Healthcare  Providers. Exception #1 (Emergencies): In the event of an emergency (i.e.: accident requiring emergency care), you are allowed to receive additional pain medication. However, you are responsible for: As soon as you are able, call our office [phone], at any time of the day or night, and leave a message stating your name, the date and nature of the emergency, and the name and dose of the medication prescribed. In the event that your call is answered by a member of our staff, make sure to document and save the date, time, and the name of the person that took your information.  Exception #2 (Planned Surgery): In the event that you are scheduled by another doctor or dentist to have any type of surgery or procedure, you are allowed (for a period no longer than 30 days), to receive additional pain medication, for the acute post-op pain. However, in this case, you are responsible for picking up a copy of our "Post-op Pain Management for Surgeons" handout, and giving it to your surgeon or dentist. This document is available at our office, and does not require an appointment to obtain it. Simply go to our office during business hours (Monday-Thursday from 8:00 AM to 4:00 PM) (Friday 8:00 AM to 12:00 Noon) or if you have a scheduled appointment with us, prior to your surgery, and ask for it by name. In addition, you are responsible for: calling our office [phone], at any time of the day or night, and leaving a message stating your name, name of your surgeon, type of surgery, and date of procedure or surgery. Failure to comply with your responsibilities may result in termination of therapy involving the controlled substances. Medication Agreement Violation. Following the above rules, including your responsibilities will help you in avoiding a Medication Agreement Violation ("Breaking your Pain Medication Contract").  Consequences:  Not following the above rules may result in permanent discontinuation of  medication prescription therapy.  *Opioid medications include: morphine, codeine, oxycodone, oxymorphone, hydrocodone, hydromorphone, meperidine, tramadol, tapentadol, buprenorphine, fentanyl, methadone. **Benzodiazepine medications include: diazepam (Valium), alprazolam (Xanax), clonazepam (Klonopine), lorazepam (Ativan), clorazepate (Tranxene), chlordiazepoxide (Librium), estazolam (Prosom), oxazepam (Serax), temazepam (Restoril), triazolam (Halcion) (Last updated: 01/03/2022) ______________________________________________________________________    ______________________________________________________________________  Medication Recommendations and Reminders  Applies to: All patients receiving prescriptions (written and/or electronic).  Medication Rules & Regulations: You are responsible for reading, knowing, and following our "Medication Rules" document. These exist for your safety and that of others. They are not flexible and neither are we. Dismissing or ignoring them is an   act of "non-compliance" that may result in complete and irreversible termination of such medication therapy. For safety reasons, "non-compliance" will not be tolerated. As with the [place] fundamental legal principle of "ignorance of the law is no defense", we will accept no excuses for not having read and knowing the content of documents provided to you by our practice.  Pharmacy of record:  Definition: This is the pharmacy where your electronic prescriptions will be sent.  We do not endorse any particular pharmacy. It is up to you and your insurance to decide what pharmacy to use.  We do not restrict you in your choice of pharmacy. However, once we write for your prescriptions, we will NOT be re-sending more prescriptions to fix restricted supply problems created by your pharmacy, or your insurance.  The pharmacy listed in the electronic medical record should be the one where you want electronic prescriptions to be  sent. If you choose to change pharmacy, simply notify our nursing staff. Changes will be made only during your regular appointments and not over the phone.  Recommendations: Keep all of your pain medications in a safe place, under lock and key, even if you live alone. We will NOT replace lost, stolen, or damaged medication. We do not accept "Police Reports" as proof of medications having been stolen. After you fill your prescription, take 1 week's worth of pills and put them away in a safe place. You should keep a separate, properly labeled bottle for this purpose. The remainder should be kept in the original bottle. Use this as your primary supply, until it runs out. Once it's gone, then you know that you have 1 week's worth of medicine, and it is time to come in for a prescription refill. If you do this correctly, it is unlikely that you will ever run out of medicine. To make sure that the above recommendation works, it is very important that you make sure your medication refill appointments are scheduled at least 1 week before you run out of medicine. To do this in an effective manner, make sure that you do not leave the office without scheduling your next medication management appointment. Always ask the nursing staff to show you in your prescription , when your medication will be running out. Then arrange for the receptionist to get you a return appointment, at least 7 days before you run out of medicine. Do not wait until you have 1 or 2 pills left, to come in. This is very poor planning and does not take into consideration that we may need to cancel appointments due to bad weather, sickness, or emergencies affecting our staff. DO NOT ACCEPT A "Partial Fill": If for any reason your pharmacy does not have enough pills/tablets to completely fill or refill your prescription, do not allow for a "partial fill". The law allows the pharmacy to complete that prescription within 72 hours, without requiring a new  prescription. If they do not fill the rest of your prescription within those 72 hours, you will need a separate prescription to fill the remaining amount, which we will NOT provide. If the reason for the partial fill is your insurance, you will need to talk to the pharmacist about payment alternatives for the remaining tablets, but again, DO NOT ACCEPT A PARTIAL FILL, unless you can trust your pharmacist to obtain the remainder of the pills within 72 hours.  Prescription refills and/or changes in medication(s):  Prescription refills, and/or changes in dose or medication, will be conducted only   during scheduled medication management appointments. (Applies to both, written and electronic prescriptions.) No refills on procedure days. No medication will be changed or started on procedure days. No changes, adjustments, and/or refills will be conducted on a procedure day. Doing so will interfere with the diagnostic portion of the procedure. No phone refills. No medications will be "called into the pharmacy". No Fax refills. No weekend refills. No Holliday refills. No after hours refills.  Remember:  Business hours are:  Monday to Thursday 8:00 AM to 4:00 PM Provider's Schedule: Vertis Scheib, MD - Appointments are:  Medication management: Monday and Wednesday 8:00 AM to 4:00 PM Procedure day: Tuesday and Thursday 7:30 AM to 4:00 PM Bilal Lateef, MD - Appointments are:  Medication management: Tuesday and Thursday 8:00 AM to 4:00 PM Procedure day: Monday and Wednesday 7:30 AM to 4:00 PM (Last update: 01/03/2022) ______________________________________________________________________    ____________________________________________________________________________________________  WARNING: CBD (cannabidiol) & Delta (Delta-8 tetrahydrocannabinol) products.   Applicable to:  All individuals currently taking or considering taking CBD (cannabidiol) and, more important, all patients taking opioid  analgesic controlled substances (pain medication). (Example: oxycodone; oxymorphone; hydrocodone; hydromorphone; morphine; methadone; tramadol; tapentadol; fentanyl; buprenorphine; butorphanol; dextromethorphan; meperidine; codeine; etc.)  Introduction:  Recently there has been a drive towards the use of "natural" products for the treatment of different conditions, including pain anxiety and sleep disorders. Marijuana and hemp are two varieties of the cannabis genus plants. Marijuana and its derivatives are illegal, while hemp and its derivatives are not. Cannabidiol (CBD) and tetrahydrocannabinol (THC), are two natural compounds found in plants of the Cannabis genus. They can both be extracted from hemp or marijuana. Both compounds interact with your body's endocannabinoid system in very different ways. CBD is associated with pain relief (analgesia) while THC is associated with the psychoactive effects ("the high") obtained from the use of marijuana products. There are two main types of THC: Delta-9, which comes from the marijuana plant and it is illegal, and Delta-8, which comes from the hemp plant, and it is legal. (Both, Delta-9-THC and Delta-8-THC are psychoactive and give you "the high".)   Legality:  Marijuana and its derivatives: illegal Hemp and its derivatives: Legal (State dependent) UPDATE: (04/29/2021) The Drug Enforcement Agency (DEA) issued a letter stating that "delta" cannabinoids, including Delta-8-THCO and Delta-9-THCO, synthetically derived from hemp do not qualify as hemp and will be viewed as Schedule I drugs. (Schedule I drugs, substances, or chemicals are defined as drugs with no currently accepted medical use and a high potential for abuse. Some examples of Schedule I drugs are: heroin, lysergic acid diethylamide (LSD), marijuana (cannabis), 3,4-methylenedioxymethamphetamine (ecstasy), methaqualone, and peyote.) (https://www.dea.gov)  Legal status of CBD in [place]:  "Conditionally  Legal"  Reference: "FDA Regulation of Cannabis and Cannabis-Derived Products, Including Cannabidiol (CBD)" - https://www.fda.gov/news-events/public-health-focus/fda-regulation-cannabis-and-cannabis-derived-products-including-cannabidiol-cbd  Warning:  CBD is not FDA approved and has not undergo the same manufacturing controls as prescription drugs.  This means that the purity and safety of available CBD may be questionable. Most of the time, despite manufacturer's claims, it is contaminated with THC (delta-9-tetrahydrocannabinol - the chemical in marijuana responsible for the "HIGH").  When this is the case, the THC contaminant will trigger a positive urine drug screen (UDS) test for Marijuana (carboxy-THC).   The FDA recently put out a warning about 5 things that everyone should be aware of regarding Delta-8 THC: Delta-8 THC products have not been evaluated or approved by the FDA for safe use and may be marketed in ways that put the public health at   risk. The FDA has received adverse event reports involving delta-8 THC-containing products. Delta-8 THC has psychoactive and intoxicating effects. Delta-8 THC manufacturing often involve use of potentially harmful chemicals to create the concentrations of delta-8 THC claimed in the marketplace. The final delta-8 THC product may have potentially harmful by-products (contaminants) due to the chemicals used in the process. Manufacturing of delta-8 THC products may occur in uncontrolled or unsanitary settings, which may lead to the presence of unsafe contaminants or other potentially harmful substances. Delta-8 THC products should be kept out of the reach of children and pets.  NOTE: Because a positive UDS for any illicit substance is a violation of our medication agreement, your opioid analgesics (pain medicine) may be permanently discontinued.  MORE ABOUT CBD  General Information: CBD was discovered in 1940 and it is a derivative of the cannabis sativa  genus plants (Marijuana and Hemp). It is one of the 113 identified substances found in Marijuana. It accounts for up to 40% of the plant's extract. As of 2018, preliminary clinical studies on CBD included research for the treatment of anxiety, movement disorders, and pain. CBD is available and consumed in multiple forms, including inhalation of smoke or vapor, as an aerosol spray, and by mouth. It may be supplied as an oil containing CBD, capsules, dried cannabis, or as a liquid solution. CBD is thought not to be as psychoactive as THC (delta-9-tetrahydrocannabinol - the chemical in marijuana responsible for the "HIGH"). Studies suggest that CBD may interact with different biological target receptors in the body, including cannabinoid and other neurotransmitter receptors. As of 2018 the mechanism of action for its biological effects has not been determined.  Side-effects  Adverse reactions: Dry mouth, diarrhea, decreased appetite, fatigue, drowsiness, malaise, weakness, sleep disturbances, and others.  Drug interactions:  CBD may interact with medications such as blood-thinners. CBD causes drowsiness on its own and it will increase drowsiness caused by other medications, including antihistamines (such as Benadryl), benzodiazepines (Xanax, Ativan, Valium), antipsychotics, antidepressants, opioids, alcohol and supplements such as kava, melatonin and St. John's Wort.  Other drug interactions: Brivaracetam (Briviact); Caffeine; Carbamazepine (Tegretol); Citalopram (Celexa); Clobazam (Onfi); Eslicarbazepine (Aptiom); Everolimus (Zostress); Lithium; Methadone (Dolophine); Rufinamide (Banzel); Sedative medications (CNS depressants); Sirolimus (Rapamune); Stiripentol (Diacomit); Tacrolimus (Prograf); Tamoxifen ; Soltamox); Topiramate (Topamax); Valproate; Warfarin (Coumadin); Zonisamide. (Last update: 02/20/2022) ____________________________________________________________________________________________    ____________________________________________________________________________________________  Naloxone Nasal Spray  Why am I receiving this medication? [place] STOP ACT requires that all patients taking high dose opioids or at risk of opioids respiratory depression, be prescribed an opioid reversal agent, such as Naloxone (AKA: Narcan).  What is this medication? NALOXONE (nal OX one) treats opioid overdose, which causes slow or shallow breathing, severe drowsiness, or trouble staying awake. Call emergency services after using this medication. You may need additional treatment. Naloxone works by reversing the effects of opioids. It belongs to a group of medications called opioid blockers.  COMMON BRAND NAME(S): Kloxxado, Narcan  What should I tell my care team before I take this medication? They need to know if you have any of these conditions: Heart disease Substance use disorder An unusual or allergic reaction to naloxone, other medications, foods, dyes, or preservatives Pregnant or trying to get pregnant Breast-feeding  When to use this medication? This medication is to be used for the treatment of respiratory depression (less than 8 breaths per minute) secondary to opioid overdose.   How to use this medication? This medication is for use in the nose. Lay the person on their   back. Support their neck with your hand and allow the head to tilt back before giving the medication. The nasal spray should be given into 1 nostril. After giving the medication, move the person onto their side. Do not remove or test the nasal spray until ready to use. Get emergency medical help right away after giving the first dose of this medication, even if the person wakes up. You should be familiar with how to recognize the signs and symptoms of a narcotic overdose. If more doses are needed, give the additional dose in the other nostril. Talk to your care team about the use of this medication in children.  While this medication may be prescribed for children as young as newborns for selected conditions, precautions do apply.  Naloxone Overdosage: If you think you have taken too much of this medicine contact a poison control center or emergency room at once.  NOTE: This medicine is only for you. Do not share this medicine with others.  What if I miss a dose? This does not apply.  What may interact with this medication? This is only used during an emergency. No interactions are expected during emergency use. This list may not describe all possible interactions. Give your health care provider a list of all the medicines, herbs, non-prescription drugs, or dietary supplements you use. Also tell them if you smoke, drink alcohol, or use illegal drugs. Some items may interact with your medicine.  What should I watch for while using this medication? Keep this medication ready for use in the case of an opioid overdose. Make sure that you have the phone number of your care team and local hospital ready. You may need to have additional doses of this medication. Each nasal spray contains a single dose. Some emergencies may require additional doses. After use, bring the treated person to the nearest hospital or call 911. Make sure the treating care team knows that the person has received a dose of this medication. You will receive additional instructions on what to do during and after use of this medication before an emergency occurs.  What side effects may I notice from receiving this medication? Side effects that you should report to your care team as soon as possible: Allergic reactions--skin rash, itching, hives, swelling of the face, lips, tongue, or throat Side effects that usually do not require medical attention (report these to your care team if they continue or are bothersome): Constipation Dryness or irritation inside the nose Headache Increase in blood pressure Muscle spasms Stuffy  nose Toothache This list may not describe all possible side effects. Call your doctor for medical advice about side effects. You may report side effects to FDA at 1-800-FDA-1088.  Where should I keep my medication? Because this is an emergency medication, you should keep it with you at all times.  Keep out of the reach of children and pets. Store between 20 and 25 degrees C (68 and 77 degrees F). Do not freeze. Throw away any unused medication after the expiration date. Keep in original box until ready to use.  NOTE: This sheet is a summary. It may not cover all possible information. If you have questions about this medicine, talk to your doctor, pharmacist, or health care provider.   2023 Elsevier/Gold Standard (2020-11-05 00:00:00)  ____________________________________________________________________________________________   

## 2022-03-15 NOTE — Progress Notes (Signed)
Nursing Pain Medication Assessment:  Safety precautions to be maintained throughout the outpatient stay will include: orient to surroundings, keep bed in low position, maintain call bell within reach at all times, provide assistance with transfer out of bed and ambulation.  Medication Inspection Compliance: Pill count conducted under aseptic conditions, in front of the patient. Neither the pills nor the bottle was removed from the patient's sight at any time. Once count was completed pills were immediately returned to the patient in their original bottle.  Medication: Morphine IR Pill/Patch Count:  38 of 120 pills remain Pill/Patch Appearance: Markings consistent with prescribed medication Bottle Appearance: Standard pharmacy container. Clearly labeled. Filled Date: 63 / 12 / 2023 Last Medication intake:  TodaySafety precautions to be maintained throughout the outpatient stay will include: orient to surroundings, keep bed in low position, maintain call bell within reach at all times, provide assistance with transfer out of bed and ambulation.

## 2022-03-15 NOTE — Progress Notes (Signed)
PROVIDER NOTE: Information contained herein reflects review and annotations entered in association with encounter. Interpretation of such information and data should be left to medically-trained personnel. Information provided to patient can be located elsewhere in the medical record under "Patient Instructions". Document created using STT-dictation technology, any transcriptional errors that may result from process are unintentional.    Patient: [name]  Service Category: E/M  Provider: Gaspar Cola, MD  DOB: April 09, 1960  DOS: 03/15/2022  Referring Provider: Virginia Crews, MD  MRN: [id number]  Specialty: Interventional Pain Management  PCP: Virginia Crews, MD  Type: Established Patient  Setting: Ambulatory outpatient    Location: Office  Delivery: Face-to-face     HPI  [name], a 62 y.o. year old female, is here today because of her Chronic pain syndrome [G89.4]. [name] primary complain today is Back Pain (lower) Last encounter: My last encounter with her was on 12/21/2021. Pertinent problems: 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); Chronic hip pain (2ry area of Pain) (Bilateral) (L>R); Chronic shoulder arthropathy (Left); Trigger point of shoulder region (Left);  Enthesopathy of hip region (Left); Groin pain, chronic, left; Other intervertebral disc degeneration, lumbar region; History of lumbar surgery; DDD (degenerative disc disease), lumbosacral; Neuropathic pain; Neurogenic pain; Spondylosis without myelopathy or radiculopathy, lumbosacral region; Chronic musculoskeletal pain; Cervical radiculitis (C4,C5,C8) (Left); DDD (degenerative disc disease), cervical; Foraminal stenosis of cervical region (C3-4) (Right); 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); Abnormal MRI, shoulder (03/20/2017); Chronic neck pain with history of cervical spinal surgery; Enthesopathy of shoulder (Left); Osteoarthritis of shoulder (Left); Symptoms referable to shoulder joint; Tendinopathy of rotator cuff (Left); Sprain of supraspinatus muscle or tendon, sequela (Left); Subdeltoid bursitis of shoulder (Left); Subacromial bursitis of shoulder (Left); Chronic shoulder pain (Left); Abnormal MRI, cervical spine (05/12/2020); Chronic upper back pain; Chronic shoulder pain (Right); Osteoarthritis of shoulder (Right); Arthropathy of shoulder (Right); C7 radiculopathy (Right); Cervical radiculopathy at C8 (Right); Muscle weakness of upper extremity (Right); Weakness of upper extremity (Right); Cervical fusion syndrome; Failed back syndrome of cervical spine; Cervicalgia; and Chronic hip pain after total replacement (THR) (Right) on their pertinent problem list. Pain Assessment: Severity of Chronic pain is reported as a 6 /10. Location: Back Left, Right/pain radiaities down her left leg. Onset: More than a month ago. Quality: Aching, Constant, Discomfort, Throbbing, Nagging, Sharp. Timing: Constant. Modifying factor(s): Meds helps 3 to 4 hours only. Vitals:  height is _0  (1.626 m) and weight is 160 lb (72.6 kg). Her temperature is 97 F (36.1 C) (abnormal). Her blood pressure is 161/95 (abnormal) and her pulse is 70. Her oxygen saturation is 100%.  BMI: Estimated body mass index is  27.46 kg/m as calculated from the following:   Height as of this encounter: _1  (1.626 m).   Weight as of this encounter: 160 lb (72.6 kg).  Reason for encounter: medication management.  The patient indicates doing well with the current medication regimen. No  adverse reactions or side effects reported to the medications.  However, the patient indicates that she has noticed that the medicine is no longer lasting 6 hours.  She refers that currently it is lasting about 3 to 4 hours.  This triggered a conversation regarding tolerance. I took the opportunity to explain the concept of tolerance and how "Drug Holidays" help control it.  I detailed how to do a slow taper to avoid withdrawals.  Currently she is also having some problems with her left leg since she fell around December and this led to an injury on her upper left shin, just below the kneecap.  Because she is on Eliquis, she developed a hematoma that can be seen to have spread almost to the ankle through the anterior portion of her lower leg.  She describes pain all over that area and a skin lesion can be seen where she hit the leg.  Around that area there is some redness and I have instructed the patient to carefully keep an eye on it since there is the possibility of secondary infection such as cellulitis.  The patient was provided with some information regarding what to look for and what to do if she begins to have those symptoms.  (See the PCP for possible antibiotic therapy).  With regards to the fall, she indicates that this was due to her right "foot drop" where she indicates that when she is walking, especially trying to lift the right leg on a set of stairs, her right foot gets caught on the step and he has trigger several falls or near falls in the past.  She is also having left lower extremity swelling with the left side being worse than the right.  Review of her lab work shows her total protein and albumin levels to be low suggesting the  possibility that this is contributing to her lower extremity swelling.  Because "foot drop" is usually associated with an L5 injury and the patient has a prior history of extensive spinal surgery with hardware, I went ahead and reviewed her lumbar spine imaging.  A CT of the lumbar spine done on 05/24/2018 which reads as follows: IMPRESSION: Status post fusion from T10-L4. T10 and T11 are not included on this examination. Hardware is intact and appropriately positioned from T12-L4 with a solid posterior fusion mass at all levels and solid interbody fusion at L2-3 and L3-4 where interbody spacers are in place. Widely patent central canal and foramina at all levels. Mild to moderate facet degenerative disease L4-5 and L5-S1 is more notable L4-5.  Unfortunately, to complicate things further, she is allergic/intolerant to steroids and therefore she is not a good candidate for steroid taper therapy.  Today I have recommended that she continue treating her current acute pain with physical modalities such as cold and heat.  I have also instructed her to keep an eye on the redness and to quickly notify her PCP if there are any changes.  Today we have agreed that once she gets over this acute episode, we will revisit the possibility of repeating her "Drug Holidays".  She indicates that she has done these before.  I have instructed her that when she is ready to proceed with a drug holiday to please give [place] a call.  She understood and accepted.  PMP: 4/4 prescriptions written on 12/14/2021 where filled.  Last: 02/21/2022.  RTCB: 06/21/2022   Last interventional therapy: 05/13/2020 consisted of left suprascapular nerve RFA #2 (w/o steroids)  Pharmacotherapy Assessment  Analgesic: Morphine IR 15 mg 1 tab p.o. every 6 hours (60 mg/day of morphine) MME/day: 60 mg/day.   Monitoring: [place] PMP: PDMP reviewed during this encounter.       Pharmacotherapy: No side-effects or adverse reactions reported. Compliance: No  problems identified. Effectiveness: Clinically acceptable.  Chauncey Fischer, RN  03/15/2022  2:35 PM  Sign when Signing Visit Nursing Pain Medication Assessment:  Safety precautions to be maintained throughout the outpatient stay will include: orient to surroundings, keep bed in low position, maintain call bell within reach at all times, provide assistance with transfer out of bed and ambulation.  Medication Inspection Compliance: Pill count conducted under aseptic conditions, in front of the patient. Neither the pills nor the bottle was removed from the patient's sight at any time. Once count was completed pills were immediately returned to the patient in their original bottle.  Medication: Morphine IR Pill/Patch Count:  38 of 120 pills remain Pill/Patch Appearance: Markings consistent with prescribed medication Bottle Appearance: Standard pharmacy container. Clearly labeled. Filled Date: 69 / 12 / 2023 Last Medication intake:  TodaySafety precautions to be maintained throughout the outpatient stay will include: orient to surroundings, keep bed in low position, maintain call bell within reach at all times, provide assistance with transfer out of bed and ambulation.     No results found for: "CBDTHCR" No results found for: "D8THCCBX" No results found for: "D9THCCBX"  UDS:  Summary  Date Value Ref Range Status  09/12/2021 Note  Final    Comment:    ==================================================================== [employer] Select 13 (MW) ==================================================================== Test                             Result       Flag       Units  Drug Present and Declared for Prescription Verification   Alprazolam                     434          EXPECTED   ng/mg creat   Alpha-hydroxyalprazolam        264          EXPECTED   ng/mg creat    Source of alprazolam is a scheduled prescription medication. Alpha-    hydroxyalprazolam is an expected metabolite of  alprazolam.    Morphine                       >11111       EXPECTED   ng/mg creat   Normorphine                    951          EXPECTED   ng/mg creat    Potential sources of large amounts of morphine in the absence of    codeine include administration of morphine or use of heroin.     Normorphine is an expected metabolite of morphine.    Hydromorphone                  407          EXPECTED   ng/mg creat    Hydromorphone may be present as a metabolite of morphine;    concentrations of hydromorphone rarely exceed 5% of the morphine    concentration when this is the source of hydromorphone.  ==================================================================== Test  Result    Flag   Units      Ref Range   Creatinine              90               mg/dL      >=20 ==================================================================== Declared Medications:  The flagging and interpretation on this report are based on the  following declared medications.  Unexpected results may arise from  inaccuracies in the declared medications.   **Note: The testing scope of this panel includes these medications:   Alprazolam (Xanax)  Morphine (MSIR)   **Note: The testing scope of this panel does not include the  following reported medications:   Amiodarone (Pacerone)  Apixaban (Eliquis)  Buspirone (Buspar)  Clonidine  Diltiazem  Eye Drops  Linaclotide (Linzess)  Menthol  Montelukast (Singulair)  Omeprazole (Prilosec)  Ondansetron (Zofran)  Pancrelipase (Creon)  Polyethylene Glycol (MiraLAX)  Sodium Chloride  Valacyclovir (Valtrex) ==================================================================== For clinical consultation, please call [phone]. ====================================================================       ROS  Constitutional: Denies any fever or chills Gastrointestinal: No reported hemesis, hematochezia, vomiting, or acute GI  distress Musculoskeletal: Denies any acute onset joint swelling, redness, loss of ROM, or weakness Neurological: No reported episodes of acute onset apraxia, aphasia, dysarthria, agnosia, amnesia, paralysis, loss of coordination, or loss of consciousness  Medication Review  ALPRAZolam, acetaminophen, apixaban, busPIRone, dexlansoprazole, diltiazem, linaclotide, lipase/protease/amylase, montelukast, morphine, naloxone, and valACYclovir  History Review  Allergy: Ms. Pointer is allergic to amoxicillin, chlorhexidine gluconate, clindamycin, codeine, erythromycin, penicillin g, sulfa antibiotics, levofloxacin, shellfish allergy, decadron [dexamethasone], mangifera indica, papaya derivatives, betadine [povidone iodine], clarithromycin, other, povidone-iodine, and prednisone. Drug: Ms. Escalante  reports current drug use. Alcohol:  reports no history of alcohol use. Tobacco:  reports that she has never smoked. She has never used smokeless tobacco. Social: Ms. Spilker  reports that she has never smoked. She has never used smokeless tobacco. She reports current drug use. She reports that she does not drink alcohol. 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), Anemia, Anxiety, Asthma, Cephalalgia (08/18/2014), Cervical disc disease (08/18/2014), Chronic headaches, Chronic, continuous use of opioids, DDD (degenerative disc disease), Depression, Dissociative disorder, Dizziness (04/22/2013), Duodenal ulcer with hemorrhage and perforation ([place]) (04/27/2003), Foot drop, right, GERD (gastroesophageal reflux disease), H/O arthrodesis (08/18/2014), Headache(784.0), History of blood transfusion, History of cardiac cath, History of cardioversion, History of cervical spinal surgery (01/04/2015), History of kidney stones, Hypertension, Inverted T wave, Iron deficiency (02/01/2021), Mitral regurgitation, Narrowing  of intervertebral disc space (08/18/2014), Orthostatic hypotension (04/22/2013), Pain, Paroxysmal atrial fibrillation ([place]) (2021), Pneumonia, PONV (postoperative nausea and vomiting), Postop Hyponatremia (05/14/2012), Postoperative anemia due to acute blood loss (05/14/2012), PTSD (post-traumatic stress disorder), Right foot drop, Right hip arthralgia (08/18/2014), Sleep apnea (2021), Therapeutic opioid-induced constipation (OIC), and Typical atrial flutter ([place]). Surgical: Ms. Alcaide  has a past surgical history that includes RIGHT HIP ARTHROSCOPY FOR LABRAL TEAR (ABOUT 2010); Anterior fusion cervical spine (MAY 2012); Nasal septum surgery (MARCH 2013); Back surgery (2009); Tonsillectomy; Abdominal hysterectomy; DIAGNOSTIC LAPAROSCOPIES - MULTIPLE FOR ENDOMETRIOSIS; Cholecystectomy; Hip arthroscopy (09/20/2011); Shoulder arthroscopy (05-06-12); Carpal tunnel release (05-06-12); Total hip arthroplasty (Right, 05/13/2012); Anterior cervical decomp/discectomy fusion (N/A, 10/30/2012); Posterior cervical fusion/foraminotomy (N/A, 04/16/2013); Breast reduction surgery (Bilateral, 06/2016); Colonoscopy with propofol (N/A, 01/26/2017); Esophagogastroduodenoscopy (egd) with propofol (N/A, 01/26/2017); Esophageal dilation (N/A, 01/26/2017); polypectomy (N/A, 01/26/2017); PH impedance study (N/A, 05/30/2017); Esophageal manometry (N/A, 05/30/2017); Radiofrequency ablation nerves;  afib (05/2019); LEFT HEART CATH AND CORONARY ANGIOGRAPHY (Left, 06/10/2019); Cardioversion (N/A, 12/03/2019); Esophagogastroduodenoscopy (egd) with propofol (N/A, 01/30/2020); Reduction mammaplasty (Bilateral, 05/2016); Breast biopsy (Right, 2008); Breast excisional biopsy (Left, 1998); TEE without cardioversion (N/A, 08/05/2020); ATRIAL FIBRILLATION ABLATION (N/A, 08/06/2020); Cardiac electrophysiology mapping and ablation; Esophagogastroduodenoscopy (egd) with propofol (N/A, 01/15/2021); Colonoscopy with propofol (N/A, 01/16/2021); Cardioversion (N/A,  08/01/2021); Cardioversion (N/A, 08/05/2021); Esophagogastroduodenoscopy (egd) with propofol (N/A, 12/07/2021); and Lower Extremity Angiography (Left, 01/06/2022). Family: family history includes Aneurysm in her maternal grandmother and mother; Anxiety disorder in her grandchild; Bipolar disorder in her grandchild and sister; Breast cancer (age of onset: 67) in her paternal grandmother; Depression in her grandchild.  Laboratory Chemistry Profile   Renal Lab Results  Component Value Date   BUN 12 01/07/2022   CREATININE 0.82 01/07/2022   BCR 9 (L) 01/19/2021   GFRAA >60 11/26/2019   GFRNONAA >60 01/07/2022    Hepatic Lab Results  Component Value Date   AST 12 (L) 01/04/2022   ALT 10 01/04/2022   ALBUMIN 3.3 (L) 01/04/2022   ALKPHOS 87 01/04/2022   LIPASE 26 08/08/2021    Electrolytes Lab Results  Component Value Date   NA 139 01/07/2022   K 3.8 01/07/2022   CL 109 01/07/2022   CALCIUM 8.1 (L) 01/07/2022   MG 2.3 08/08/2021    Bone Lab Results  Component Value Date   25OHVITD1 23 (L) 11/11/2018   25OHVITD2 9.8 11/11/2018   25OHVITD3 13 11/11/2018    Inflammation (CRP: Acute Phase) (ESR: Chronic Phase) Lab Results  Component Value Date   CRP 12 (H) 11/11/2018   ESRSEDRATE 42 (H) 11/11/2018   LATICACIDVEN 1.0 01/14/2021         Note: Above Lab results reviewed.  Recent Imaging Review  VAS [place] GROIN PSEUDOANEURYSM  ARTERIAL PSEUDOANEURYSM    Patient Name:  YATZARY MERRIWEATHER  Date of Exam:   02/13/2022 Medical Rec #: [id number]         Accession #:    [id number] Date of Birth: 04-23-1960         Patient Gender: F Patient Age:   3 years Exam Location:  [facility] Procedure:      VAS [place] Gloriajean Dell Referring Phys: Hortencia Pilar  --------------------------------------------------------------------------------   Exam: Left groin Indications: Patient complains of groin pain and palpable knot. 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 Performing Technologist: Delorise Shiner RVT    Examination Guidelines: A complete evaluation includes B-mode imaging, spectral Doppler, color Doppler, and power Doppler as needed of all accessible portions of each vessel. Bilateral testing is considered an integral part of a complete examination. Limited examinations for reoccurring indications may be performed as noted.  +-----------+----------+---------+------+----------+ Left DuplexPSV (cm/s)Waveform PlaqueComment(s) +-----------+----------+---------+------+----------+ CFA           120    triphasic                 +-----------+----------+---------+------+----------+  Left Vein comments: CFV is patent. 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    Diagnosing physician: Hortencia Pilar MD Electronically signed by Hortencia Pilar MD on 02/16/2022 at 12:33:39 PM.      --------------------------------------------------------------------------------      Final   Note: Reviewed        Physical Exam  General appearance: Well nourished, well developed, and well hydrated.  In no apparent acute distress Mental status: Alert, oriented x 3 (person, place, & time)       Respiratory: No evidence of acute respiratory distress Eyes: PERLA Vitals: BP (!) 161/95   Pulse 70   Temp (!) 97 F (36.1 C)   Ht _0  (1.626 m)   Wt 160 lb (72.6 kg)   SpO2 100%   BMI 27.46 kg/m  BMI: Estimated body mass index is 27.46 kg/m as calculated from the following:   Height as of this encounter: _1  (1.626 m).   Weight as of this encounter: 160 lb (72.6 kg). Ideal: Ideal body weight: 54.7 kg (120 lb 9.5 oz) Adjusted ideal body weight: 61.9 kg (136 lb 5.7 oz)  Assessment   Diagnosis Status  1. Chronic pain syndrome   2. Chronic low back  pain (1ry area of Pain) (Bilateral) (L>R) w/o sciatica   3. Chronic hip pain (2ry area of Pain) (Bilateral) (L>R)   4. Chronic shoulder pain (3ry area of Pain) (Bilateral) (L>R)   5. Cervicalgia   6. Cervicogenic headache   7. Chronic upper back pain   8. Lumbar facet syndrome (Bilateral) (L>R)   9. Failed back syndrome of cervical spine   10. Chronic musculoskeletal pain   11. 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 Chronic neck pain with history of cervical spinal surgery   13. Pharmacologic therapy   14. Chronic use of opiate for therapeutic purpose   15. Encounter for medication management   16. Encounter for chronic pain management    Controlled Controlled Controlled   Updated Problems: Problem  Chronic Gerd  Chronic Constipation    Plan of Care  Problem-specific:  No problem-specific Assessment & Plan notes found for this encounter.  Ms. LESHONDA GALAMBOS has a current medication list which includes the following long-term medication(s): apixaban, dexlansoprazole, diltiazem, linzess, montelukast, [START ON 03/23/2022] morphine, [START ON 04/22/2022] morphine, and [START ON 05/22/2022] morphine.  Pharmacotherapy (Medications Ordered): Meds ordered this encounter  Medications   morphine (MSIR) 15 MG tablet    Sig: Take 1 tablet (15 mg total) by mouth every 6 (six) hours as needed for moderate pain or severe pain. Must last 30 days.    Dispense:  120 tablet    Refill:  0    DO NOT: delete (not duplicate); no partial-fill (will deny script to complete), no refill request (F/U required). DISPENSE: 1 day early if closed on fill date. WARN: No CNS-depressants within 8 hrs of med.   morphine (MSIR) 15 MG tablet    Sig: Take 1 tablet (15 mg total) by mouth every 6 (six) hours as needed for moderate pain or severe pain. Must last 30 days.    Dispense:  120 tablet    Refill:  0    DO NOT: delete (not duplicate); no partial-fill (will deny script to complete), no  refill request (F/U required). DISPENSE: 1 day early if closed on fill date. WARN: No CNS-depressants within 8 hrs of med.   morphine (MSIR) 15 MG tablet    Sig: Take 1 tablet (15 mg total) by mouth every 6 (six) hours as needed for moderate pain or severe pain. Must last 30 days.    Dispense:  120 tablet    Refill:  0    DO NOT: delete (not duplicate); no partial-fill (will deny script to complete), no refill request (F/U required). DISPENSE: 1 day early if closed on fill date. WARN: No CNS-depressants within  8 hrs of med.   Orders:  No orders of the defined types were placed in this encounter.  Follow-up plan:   Return in about 14 weeks (around 06/21/2022) for Eval-day (M,W), (F2F), (MM).     Interventional Therapies  Risk Factors  Considerations:   ELIQUIS ANTICOAGULATION: (Stop: 3 days  Restart: 6 hours) ALLERGY: CONTRAST; shellfish; STEROIDS (dexamethasone, prednisone); Iodine HARDWARE: Not a candidate for RFA or SCS (cervical or lumbar). Patient not interested in IT pump (due to surgical risks). Concomitant BENZO therapy.  (At risk of respiratory depression) A-fib/flutter; unstable angina; CAD; (+) Cardiac cath; (+) cardioversion; mitral regurgitation Multiple allergies; OSA; GERD; IBS; OIC Depression; dissociative disorder; PTSD        Planned  Pending:      Under consideration:   Diagnostic right suprascapular NB #3 (without steroids)    Completed (No Steroids):   Diagnostic bilateral lumbar facet MBB x2 (No Steroids)) (01/14/2019) (100/100/100/0)  Palliative bilateral suprascapular NB x2 (No Steroids) (06/19/2016) (80/80/0)  Palliative left suprascapular nerve RFA x1 (01/04/2017) (100/100/0. Reinjured shoulder on 02/01/2017)    Therapeutic  Palliative (PRN) options:   Diagnostic bilateral lumbar facet MBB #2  Palliative bilateral suprascapular NB #3 (No Steroids) Palliative left suprascapular nerve RFA #2    Pharmacotherapy  Failed trial of membrane stabilizers  (Neurontin/Lyrica).      Recent Visits No visits were found meeting these conditions. Showing recent visits within past 90 days and meeting all other requirements Today's Visits Date Type Provider Dept  03/15/22 Office Visit Milinda Pointer, MD [facility]  Showing today's visits and meeting all other requirements Future Appointments No visits were found meeting these conditions. Showing future appointments within next 90 days and meeting all other requirements  I discussed the assessment and treatment plan with the patient. The patient was provided an opportunity to ask questions and all were answered. The patient agreed with the plan and demonstrated an understanding of the instructions.  Patient advised to call back or seek an in-person evaluation if the symptoms or condition worsens.  Duration of encounter: 30 minutes.  Total time on encounter, as per AMA guidelines included both the face-to-face and non-face-to-face time personally spent by the physician and/or other qualified health care professional(s) on the day of the encounter (includes time in activities that require the physician or other qualified health care professional and does not include time in activities normally performed by clinical staff). Physician's time may include the following activities when performed: Preparing to see the patient (e.g., pre-charting review of records, searching for previously ordered imaging, lab work, and nerve conduction tests) Review of prior analgesic pharmacotherapies. Reviewing PMP Interpreting ordered tests (e.g., lab work, imaging, nerve conduction tests) Performing post-procedure evaluations, including interpretation of diagnostic procedures Obtaining and/or reviewing separately obtained history Performing a medically appropriate examination and/or evaluation Counseling and educating the patient/family/caregiver Ordering medications, tests, or procedures Referring and  communicating with other health care professionals (when not separately reported) Documenting clinical information in the electronic or other health record Independently interpreting results (not separately reported) and communicating results to the patient/ family/caregiver Care coordination (not separately reported)  Note by: Gaspar Cola, MD Date: 03/15/2022; Time: 3:15 PM

## 2022-03-22 ENCOUNTER — Other Ambulatory Visit: Payer: Self-pay | Admitting: Gastroenterology

## 2022-03-27 ENCOUNTER — Ambulatory Visit: Payer: Self-pay

## 2022-03-27 NOTE — Telephone Encounter (Signed)
Message from Erick Blinks sent at 03/27/2022 10:15 AM EST  Summary: Blurred vision, migraine   Pt has had am (almost) severe migraine (her words) for about 2 weeks. Left ear pain, congestion, no cough/sneezing. Clear mucus.  Best contact: [phone]  Also having vision problems, everything is blurry she says         Chief Complaint: facial  pain, headache Symptoms: earache, clear drainage from nose, hoarse, blurred vision (feels like eyes are scratchy) is followed by optometrist for blurred vision, blocked nose Frequency: Sat. Pertinent Negatives: Patient denies fever, SOB, chest pain, sore thraot Disposition: '[]'$ ED /'[]'$ Urgent Care (no appt availability in office) / '[x]'$ Appointment(In office/virtual)/ '[]'$  [facility]/ '[]'$ Home Care/ '[]'$ Refused Recommended Disposition /'[]'$ [facility]/ '[]'$  Follow-up with PCP Additional Notes: pt unable to come to appt today. Scheduled in am. No available appts with PCP. Pt has tried arthritis strength acetaminophen and saline nasal washes.  Reason for Disposition  [1] SEVERE pain AND [2] not improved 2 hours after pain medicine  Answer Assessment - Initial Assessment Questions 1. LOCATION: "Where does it hurt?"      Behinds eyes, nose burning, under eyes cheek bones = popping sound and ache to left ear  2. ONSET: "When did the sinus pain start?"  (e.g., hours, days)      Saturday 3. SEVERITY: "How bad is the pain?"   (Scale 1-10; mild, moderate or severe)   - MILD (1-3): doesn't interfere with normal activities    - MODERATE (4-7): interferes with normal activities (e.g., work or school) or awakens from sleep   - SEVERE (8-10): excruciating pain and patient unable to do any normal activities        severe 4. RECURRENT SYMPTOM: "Have you ever had sinus problems before?" If Yes, ask: "When was the last time?" and "What happened that time?"      *No Answer* 5. NASAL CONGESTION: "Is the nose blocked?" If Yes, ask: "Can you open it or  must you breathe through your mouth?"     Blocked left >right using saline wash  6. NASAL DISCHARGE: "Do you have discharge from your nose?" If so ask, "What color?"     Clear mucous 7. FEVER: "Do you have a fever?" If Yes, ask: "What is it, how was it measured, and when did it start?"      no 8. OTHER SYMPTOMS: "Do you have any other symptoms?" (e.g., sore throat, cough, earache, difficulty breathing)     Earache, gravelly voice 9. PREGNANCY: "Is there any chance you are pregnant?" "When was your last menstrual period?"     N/a  Protocols used: Sinus Pain or Congestion-A-AH

## 2022-03-27 NOTE — Telephone Encounter (Signed)
Message from Erick Blinks sent at 03/27/2022 10:15 AM EST  Summary: Blurred vision, migraine   Pt has had am (almost) severe migraine (her words) for about 2 weeks. Left ear pain, congestion, no cough/sneezing. Clear mucus.  Best contact: [phone]  Also having vision problems, everything is blurry she says         Called pt and LM on VM to call back.

## 2022-03-28 ENCOUNTER — Ambulatory Visit: Payer: Medicare Other | Admitting: Family Medicine

## 2022-03-28 NOTE — Progress Notes (Deleted)
      Established patient visit   Patient: [name]   DOB: Jul 10, 1960   62 y.o. Female  MRN: [id number] Visit Date: 03/28/2022  Today's healthcare provider: Gwyneth Sprout, FNP   No chief complaint on file.  Subjective    HPI  Upper respiratory symptoms She complains of {uri sx's' brief:15453}.with {systemic_sx:15294}. Onset of symptoms was {onset initial:119223} and {progression:119226}.She {hydration history:15378}.  Past history is significant for {respiratory illness:412}. Patient is {smoker?:15292}  ---------------------------------------------------------------------------------------------------   Medications: Outpatient Medications Prior to Visit  Medication Sig   acetaminophen (TYLENOL) 325 MG tablet Take 2 tablets (650 mg total) by mouth every 6 (six) hours as needed for mild pain (or Fever >/= 101).   ALPRAZolam (XANAX) 1 MG tablet Take 1 mg by mouth at bedtime.    apixaban (ELIQUIS) 5 MG TABS tablet Take 1 tablet (5 mg total) by mouth 2 (two) times daily.   busPIRone (BUSPAR) 15 MG tablet Take 15 mg by mouth 2 (two) times daily.   CREON 36000-114000 units CPEP capsule TAKE 2 CAPSULES WITH THE FIRST BITE OF EACH MEAL AND 1 CAPSULE WITH THE FIRST BITE OF EACH SNACK   dexlansoprazole (DEXILANT) 60 MG capsule TAKE 1 CAPSULE BY MOUTH EVERY DAY   diltiazem (CARDIZEM CD) 120 MG 24 hr capsule Take 1 capsule (120 mg total) by mouth daily.   linaclotide (LINZESS) 290 MCG CAPS capsule TAKE 1 CAPSULE BY MOUTH DAILY BEFORE BREAKFAST.   montelukast (SINGULAIR) 10 MG tablet TAKE 1 TABLET BY MOUTH EVERY DAY   morphine (MSIR) 15 MG tablet Take 1 tablet (15 mg total) by mouth every 6 (six) hours as needed for moderate pain or severe pain. Must last 30 days.   [START ON 04/22/2022] morphine (MSIR) 15 MG tablet Take 1 tablet (15 mg total) by mouth every 6 (six) hours as needed for moderate pain or severe pain. Must last 30 days.   [START ON 05/22/2022] morphine (MSIR) 15 MG tablet Take  1 tablet (15 mg total) by mouth every 6 (six) hours as needed for moderate pain or severe pain. Must last 30 days.   naloxone (NARCAN) nasal spray 4 mg/0.1 mL Place 1 spray into the nose as needed for up to 365 doses (for opioid-induced respiratory depresssion). In case of emergency (overdose), spray once into each nostril. If no response within 3 minutes, repeat application and call 366.   valACYclovir (VALTREX) 1000 MG tablet TAKE TWO TABLETS BY MOUTH TWICE DAILY FOR ONE DAY FEVER BLISTER   No facility-administered medications prior to visit.    Review of Systems  {Labs  Heme  Chem  Endocrine  Serology  Results Review (optional):23779}   Objective    There were no vitals taken for this visit. {Show previous vital signs (optional):23777}  Physical Exam  ***  No results found for any visits on 03/28/22.  Assessment & Plan     ***  No follow-ups on file.      {provider attestation***:1}   Gwyneth Sprout, [place] [phone] (phone) [phone] (fax)  [place]

## 2022-04-03 ENCOUNTER — Other Ambulatory Visit: Payer: Self-pay

## 2022-04-03 DIAGNOSIS — E7800 Pure hypercholesterolemia, unspecified: Secondary | ICD-10-CM

## 2022-04-03 DIAGNOSIS — I10 Essential (primary) hypertension: Secondary | ICD-10-CM

## 2022-04-03 DIAGNOSIS — I4891 Unspecified atrial fibrillation: Secondary | ICD-10-CM

## 2022-04-04 DIAGNOSIS — D225 Melanocytic nevi of trunk: Secondary | ICD-10-CM | POA: Diagnosis not present

## 2022-04-04 DIAGNOSIS — L570 Actinic keratosis: Secondary | ICD-10-CM | POA: Diagnosis not present

## 2022-04-04 DIAGNOSIS — D485 Neoplasm of uncertain behavior of skin: Secondary | ICD-10-CM | POA: Diagnosis not present

## 2022-04-04 DIAGNOSIS — L578 Other skin changes due to chronic exposure to nonionizing radiation: Secondary | ICD-10-CM | POA: Diagnosis not present

## 2022-04-04 DIAGNOSIS — L821 Other seborrheic keratosis: Secondary | ICD-10-CM | POA: Diagnosis not present

## 2022-04-17 DIAGNOSIS — I10 Essential (primary) hypertension: Secondary | ICD-10-CM | POA: Diagnosis not present

## 2022-04-17 DIAGNOSIS — I4891 Unspecified atrial fibrillation: Secondary | ICD-10-CM | POA: Diagnosis not present

## 2022-04-17 DIAGNOSIS — R002 Palpitations: Secondary | ICD-10-CM | POA: Diagnosis not present

## 2022-04-19 ENCOUNTER — Other Ambulatory Visit: Payer: Self-pay | Admitting: Gastroenterology

## 2022-04-20 ENCOUNTER — Telehealth: Payer: Self-pay | Admitting: Family Medicine

## 2022-04-20 DIAGNOSIS — G4733 Obstructive sleep apnea (adult) (pediatric): Secondary | ICD-10-CM

## 2022-04-20 NOTE — Telephone Encounter (Signed)
Pt uses cpap and her cardiologist advised her to see if Dr. B can refer the pt to get Inspire in place of the Cpap machine / because her BP has been high and she is having a hrad time managing it and the cardiologist thinks its because pt is not using Cpap well enough at night please advise

## 2022-04-20 NOTE — Telephone Encounter (Signed)
Left message advising referral placed.

## 2022-04-20 NOTE — Telephone Encounter (Signed)
Ok to refer to ENT for her to discuss options, just put inspire in the comments.

## 2022-04-26 ENCOUNTER — Telehealth: Payer: Self-pay

## 2022-04-26 ENCOUNTER — Ambulatory Visit: Payer: Medicare Other | Attending: Pain Medicine | Admitting: Pain Medicine

## 2022-04-26 ENCOUNTER — Encounter: Payer: Self-pay | Admitting: Pain Medicine

## 2022-04-26 VITALS — BP 120/89 | HR 66 | Temp 97.2°F | Ht 64.0 in | Wt 155.0 lb

## 2022-04-26 DIAGNOSIS — M5030 Other cervical disc degeneration, unspecified cervical region: Secondary | ICD-10-CM | POA: Insufficient documentation

## 2022-04-26 DIAGNOSIS — G8929 Other chronic pain: Secondary | ICD-10-CM | POA: Diagnosis not present

## 2022-04-26 DIAGNOSIS — M25512 Pain in left shoulder: Secondary | ICD-10-CM | POA: Diagnosis not present

## 2022-04-26 DIAGNOSIS — R002 Palpitations: Secondary | ICD-10-CM | POA: Diagnosis not present

## 2022-04-26 DIAGNOSIS — M5412 Radiculopathy, cervical region: Secondary | ICD-10-CM | POA: Diagnosis not present

## 2022-04-26 DIAGNOSIS — Z7901 Long term (current) use of anticoagulants: Secondary | ICD-10-CM | POA: Diagnosis not present

## 2022-04-26 DIAGNOSIS — R937 Abnormal findings on diagnostic imaging of other parts of musculoskeletal system: Secondary | ICD-10-CM | POA: Insufficient documentation

## 2022-04-26 DIAGNOSIS — R936 Abnormal findings on diagnostic imaging of limbs: Secondary | ICD-10-CM | POA: Diagnosis not present

## 2022-04-26 NOTE — Progress Notes (Signed)
Safety precautions to be maintained throughout the outpatient stay will include: orient to surroundings, keep bed in low position, maintain call bell within reach at all times, provide assistance with transfer out of bed and ambulation.  

## 2022-04-26 NOTE — Patient Instructions (Signed)
______________________________________________________________________  Procedure instructions  Do not eat or drink fluids (other than water) for 6 hours before your procedure  No water for 2 hours before your procedure  Take your blood pressure medicine with a sip of water  Arrive 30 minutes before your appointment  Carefully read the "Preparing for your procedure" detailed instructions  If you have questions call us at [phone]  _____________________________________________________________________    ______________________________________________________________________  Preparing for your procedure  During your procedure appointment there will be: No Prescription Refills. No disability issues to discussed. No medication changes or discussions.  Instructions: Food intake: Avoid eating anything solid for at least 8 hours prior to your procedure. Clear liquid intake: You may take clear liquids such as water up to 2 hours prior to your procedure. (No carbonated drinks. No soda.) Transportation: Unless otherwise stated by your physician, bring a driver. Morning Medicines: Except for blood thinners, take all of your other morning medications with a sip of water. Make sure to take your heart and blood pressure medicines. If your blood pressure's lower number is above 100, the case will be rescheduled. Blood thinners: Make sure to stop your blood thinners as instructed.  If you take a blood thinner, but were not instructed to stop it, call our office [phone] and ask to talk to a nurse. Not stopping a blood thinner prior to certain procedures could lead to serious complications. Diabetics on insulin: Notify the staff so that you can be scheduled 1st case in the morning. If your diabetes requires high dose insulin, take only  of your normal insulin dose the morning of the procedure and notify the staff that you have done so. Preventing infections: Shower with an  antibacterial soap the morning of your procedure.  Build-up your immune system: Take 1000 mg of Vitamin C with every meal (3 times a day) the day prior to your procedure. Antibiotics: Inform the nursing staff if you are taking any antibiotics or if you have any conditions that may require antibiotics prior to procedures. (Example: recent joint implants)   Pregnancy: If you are pregnant make sure to notify the nursing staff. Not doing so may result in injury to the fetus, including death.  Sickness: If you have a cold, fever, or any active infections, call and cancel or reschedule your procedure. Receiving steroids while having an infection may result in complications. Arrival: You must be in the facility at least 30 minutes prior to your scheduled procedure. Tardiness: Your scheduled time is also the cutoff time. If you do not arrive at least 15 minutes prior to your procedure, you will be rescheduled.  Children: Do not bring any children with you. Make arrangements to keep them home. Dress appropriately: There is always a possibility that your clothing may get soiled. Avoid long dresses. Valuables: Do not bring any jewelry or valuables.  Reasons to call and reschedule or cancel your procedure: (Following these recommendations will minimize the risk of a serious complication.) Surgeries: Avoid having procedures within 2 weeks of any surgery. (Avoid for 2 weeks before or after any surgery). Flu Shots: Avoid having procedures within 2 weeks of a flu shots or . (Avoid for 2 weeks before or after immunizations). Barium: Avoid having a procedure within 7-10 days after having had a radiological study involving the use of radiological contrast. (Myelograms, Barium swallow or enema study). Heart attacks: Avoid any elective procedures or surgeries for the initial 6 months after a "Myocardial Infarction" (Heart Attack). Blood thinners: It  is imperative that you stop these medications before procedures. Let us  know if you if you take any blood thinner.  Infection: Avoid procedures during or within two weeks of an infection (including chest colds or gastrointestinal problems). Symptoms associated with infections include: Localized redness, fever, chills, night sweats or profuse sweating, burning sensation when voiding, cough, congestion, stuffiness, runny nose, sore throat, diarrhea, nausea, vomiting, cold or Flu symptoms, recent or current infections. It is specially important if the infection is over the area that we intend to treat. Heart and lung problems: Symptoms that may suggest an active cardiopulmonary problem include: cough, chest pain, breathing difficulties or shortness of breath, dizziness, ankle swelling, uncontrolled high or unusually low blood pressure, and/or palpitations. If you are experiencing any of these symptoms, cancel your procedure and contact your primary care physician for an evaluation.  Remember:  Regular Business hours are:  Monday to Thursday 8:00 AM to 4:00 PM  Provider's Schedule: Milinda Pointer, MD:  Procedure days: Tuesday and Thursday 7:30 AM to 4:00 PM  Gillis Santa, MD:  Procedure days: Monday and Wednesday 7:30 AM to 4:00 PM  ______________________________________________________________________    ____________________________________________________________________________________________  General Risks and Possible Complications  Patient Responsibilities: It is important that you read this as it is part of your informed consent. It is our duty to inform you of the risks and possible complications associated with treatments offered to you. It is your responsibility as a patient to read this and to ask questions about anything that is not clear or that you believe was not covered in this document.  Patient's Rights: You have the right to refuse treatment. You also have the right to change your mind, even after initially having agreed to have the treatment  done. However, under this last option, if you wait until the last second to change your mind, you may be charged for the materials used up to that point.  Introduction: Medicine is not an Chief Strategy Officer. Everything in Medicine, including the lack of treatment(s), carries the potential for danger, harm, or loss (which is by definition: Risk). In Medicine, a complication is a secondary problem, condition, or disease that can aggravate an already existing one. All treatments carry the risk of possible complications. The fact that a side effects or complications occurs, does not imply that the treatment was conducted incorrectly. It must be clearly understood that these can happen even when everything is done following the highest safety standards.  No treatment: You can choose not to proceed with the proposed treatment alternative. The "PRO(s)" would include: avoiding the risk of complications associated with the therapy. The "CON(s)" would include: not getting any of the treatment benefits. These benefits fall under one of three categories: diagnostic; therapeutic; and/or palliative. Diagnostic benefits include: getting information which can ultimately lead to improvement of the disease or symptom(s). Therapeutic benefits are those associated with the successful treatment of the disease. Finally, palliative benefits are those related to the decrease of the primary symptoms, without necessarily curing the condition (example: decreasing the pain from a flare-up of a chronic condition, such as incurable terminal cancer).  General Risks and Complications: These are associated to most interventional treatments. They can occur alone, or in combination. They fall under one of the following six (6) categories: no benefit or worsening of symptoms; bleeding; infection; nerve damage; allergic reactions; and/or death. No benefits or worsening of symptoms: In Medicine there are no guarantees, only probabilities. No  healthcare provider can ever guarantee that a  medical treatment will work, they can only state the probability that it may. Furthermore, there is always the possibility that the condition may worsen, either directly, or indirectly, as a consequence of the treatment. Bleeding: This is more common if the patient is taking a blood thinner, either prescription or over the counter (example: Goody Powders, Fish oil, Aspirin, Garlic, etc.), or if suffering a condition associated with impaired coagulation (example: Hemophilia, cirrhosis of the liver, low platelet counts, etc.). However, even if you do not have one on these, it can still happen. If you have any of these conditions, or take one of these drugs, make sure to notify your treating physician. Infection: This is more common in patients with a compromised immune system, either due to disease (example: diabetes, cancer, human immunodeficiency virus [HIV], etc.), or due to medications or treatments (example: therapies used to treat cancer and rheumatological diseases). However, even if you do not have one on these, it can still happen. If you have any of these conditions, or take one of these drugs, make sure to notify your treating physician. Nerve Damage: This is more common when the treatment is an invasive one, but it can also happen with the use of medications, such as those used in the treatment of cancer. The damage can occur to small secondary nerves, or to large primary ones, such as those in the spinal cord and brain. This damage may be temporary or permanent and it may lead to impairments that can range from temporary numbness to permanent paralysis and/or brain death. Allergic Reactions: Any time a substance or material comes in contact with our body, there is the possibility of an allergic reaction. These can range from a mild skin rash (contact dermatitis) to a severe systemic reaction (anaphylactic reaction), which can result in death. Death: In  general, any medical intervention can result in death, most of the time due to an unforeseen complication. ____________________________________________________________________________________________    ____________________________________________________________________________________________  Blood Thinners  IMPORTANT NOTICE:  If you take any of these, make sure to notify the nursing staff.  Failure to do so may result in injury.  Recommended time intervals to stop and restart blood-thinners, before & after invasive procedures  Generic Name Brand Name Pre-procedure. Stop this long before procedure. Post-procedure. Minimum waiting period before restarting.  Abciximab Reopro 15 days 2 hrs  Alteplase Activase 10 days 10 days  Anagrelide Agrylin    Apixaban Eliquis 3 days 6 hrs  Cilostazol Pletal 3 days 5 hrs  Clopidogrel Plavix 7-10 days 2 hrs  Dabigatran Pradaxa 5 days 6 hrs  Dalteparin Fragmin 24 hours 4 hrs  Dipyridamole Aggrenox 11days 2 hrs  Edoxaban Lixiana; Savaysa 3 days 2 hrs  Enoxaparin  Lovenox 24 hours 4 hrs  Eptifibatide Integrillin 8 hours 2 hrs  Fondaparinux  Arixtra 72 hours 12 hrs  Hydroxychloroquine Plaquenil 11 days   Prasugrel Effient 7-10 days 6 hrs  Reteplase Retavase 10 days 10 days  Rivaroxaban Xarelto 3 days 6 hrs  Ticagrelor Brilinta 5-7 days 6 hrs  Ticlopidine Ticlid 10-14 days 2 hrs  Tinzaparin Innohep 24 hours 4 hrs  Tirofiban Aggrastat 8 hours 2 hrs  Warfarin Coumadin 5 days 2 hrs   Other medications with blood-thinning effects  Product indications Generic (Brand) names Note  Cholesterol Lipitor Stop 4 days before procedure  Blood thinner (injectable) Heparin (LMW or LMWH Heparin) Stop 24 hours before procedure  Cancer Ibrutinib (Imbruvica) Stop 7 days before procedure  Malaria/Rheumatoid Hydroxychloroquine (Plaquenil) Stop 11 days  before procedure  Thrombolytics  10 days before or after procedures   Over-the-counter (OTC) Products with  blood-thinning effects  Product Common names Stop Time  Aspirin > 325 mg Goody Powders, Excedrin, etc. 11 days  Aspirin ? 81 mg  7 days  Fish oil  4 days  Garlic supplements  7 days  Ginkgo biloba  36 hours  Ginseng  24 hours  NSAIDs Ibuprofen, Naprosyn, etc. 3 days  Vitamin E  4 days   ____________________________________________________________________________________________

## 2022-04-26 NOTE — Telephone Encounter (Signed)
Dr Dossie Arbour would like clearance to stop Eliquis for 3 days so that she may have a Facet Radiofrequency ablation done.  Do you give approval to stop the Eliquis?

## 2022-04-26 NOTE — Progress Notes (Signed)
PROVIDER NOTE: Information contained herein reflects review and annotations entered in association with encounter. Interpretation of such information and data should be left to medically-trained personnel. Information provided to patient can be located elsewhere in the medical record under "Patient Instructions". Document created using STT-dictation technology, any transcriptional errors that may result from process are unintentional.    Patient: [name]  Service Category: E/M  Provider: Gaspar Cola, MD  DOB: 10-24-1960  DOS: 04/26/2022  Referring Provider: Virginia Crews, MD  MRN: [id number]  Specialty: Interventional Pain Management  PCP: Virginia Crews, MD  Type: Established Patient  Setting: Ambulatory outpatient    Location: Office  Delivery: Face-to-face     HPI  [name], a 62 y.o. year old female, is here today because of her Chronic left shoulder pain [M25.512, G89.29]. [name] primary complain today is Shoulder Pain (left) Last encounter: My last encounter with her was on 03/15/2022. Pertinent problems: 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); Chronic hip pain (2ry area of Pain) (Bilateral) (L>R); Chronic shoulder arthropathy (Left); Trigger point of shoulder  region (Left); Enthesopathy of hip region (Left); Groin pain, chronic, left; Other intervertebral disc degeneration, lumbar region; History of lumbar surgery; DDD (degenerative disc disease), lumbosacral; Neuropathic pain; Neurogenic pain; Spondylosis without myelopathy or radiculopathy, lumbosacral region; Chronic musculoskeletal pain; Cervical radiculitis (C4,C5,C8) (Left); DDD (degenerative disc disease), cervical; Foraminal stenosis of cervical region (C3-4) (Right); 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); Abnormal MRI, shoulder (Left: 03/20/2017) (Right: 12/11/2020); Chronic neck pain with history of cervical spinal surgery; Enthesopathy of shoulder (Left); Osteoarthritis of shoulder (Left); Symptoms referable to shoulder joint; Tendinopathy of rotator cuff (Left); Sprain of supraspinatus muscle or tendon, sequela (Left); Subdeltoid bursitis of shoulder (Left); Subacromial bursitis of shoulder (Left); Chronic shoulder pain (Left); Abnormal MRI, cervical spine (05/12/2020); Chronic upper back pain; Chronic shoulder pain (Right); Osteoarthritis of shoulder (Right); Arthropathy of shoulder (Right); C7 radiculopathy (Right); Cervical radiculopathy at C8 (Right); Muscle weakness of upper extremity (Right); Weakness of upper extremity (Right); Cervical fusion syndrome; Failed back syndrome of cervical spine; Cervicalgia; and Chronic hip pain after total replacement (THR) (Right) on their pertinent problem list. Pain Assessment: Severity of Chronic pain is reported as a 8 /10. Location: Shoulder Left/Pain radiaities down her arm,and upper back on the left side. Onset: More than a month ago. Quality: Aching, Constant, Pressure, Throbbing. Timing: Constant. Modifying factor(s): heating pad, pain wakes her uop at night. Vitals:  height is 5' 4"$  (1.626 m) and weight is 155 lb (70.3 kg). Her temperature is 97.2 F (36.2 C) (abnormal). Her blood pressure is 120/89 and her pulse is 66. Her oxygen  saturation is 100%.  BMI: Estimated body mass index is 26.61 kg/m as calculated from the following:   Height as of this encounter: 5' 4"$  (1.626 m).   Weight as of this encounter: 155 lb (70.3 kg).  Reason for encounter: evaluation of worsening, or previously known (established)  problem.  The patient indicates having increased pain in the area of the left shoulder.  She has an MRI of the left shoulder that was done on 03/20/2017 which demonstrated rotator cuff tendinopathy/tendinosis with no full-thickness tear at that time.  It also indicated edema of the supraspinatus muscle possibly secondary to muscle strain, partial tear, or denervation process.  In addition, it also demonstrated subacromial/subdeltoid bursitis.  She had a referral to physical therapy on 12/16/2020 for the right shoulder pain.  On 05/13/2020 we did a left suprascapular nerve RFA #2 which provided her with 100% relief of the pain for the duration of the local anesthetic followed by a prolonged 80% improvement.  By now this has already worn off.  Today's physical exam shows significant decreased range of motion and pain on attempting to move the left shoulder.  However, she is also having pain over the area of the suprascapular muscle, scapular, and axilla, with movement of her cervical spine.  She has a prior cervical fusion (corpectomy and C5 with ACDF C5-6, with posterior fusion from C4-C6 on the right and C5-6 on the left).  Based on today's physical exam, it is clear to me that the patient has multifactorial left shoulder pain.  The patient's last radiofrequency ablation of the left suprascapular nerve provided her with 80% long-term improvement.  However there was an additional 20% of the pain that did not improve and it is clear to me that this is cervicogenic.  Today I have reminded the patient that we can repeat radiofrequency ablation, but it is likely that again she will get partial relief of the pain and the pain that is  coming from the cervical region we will have to deal with that later.  Today we also had a long conversation with regards to her steroid intolerance which she indicates gives her steroid facial flushing, blurred vision, and mood swings where she describes being unable to tolerate herself.  She admits that all of this is temporarily and usually goes away once the steroids are out of her body.  She indicates that the pain that she is experiencing currently has her thinking about the possibility of having a cervical epidural steroid injection despite the side effects of the steroids, to see if this can improve her pain.  I have explained to the patient that this is an option that we can leave for later on once we have completed the repeat radiofrequency.  The plan was shared with the patient who understood and accepted.  RTCB: 06/21/2022 (scheduled 06/14/2022)  Pharmacotherapy Assessment  Analgesic: Morphine IR 15 mg 1 tab p.o. every 6 hours (60 mg/day of morphine) MME/day: 60 mg/day.   Monitoring: [place] PMP: PDMP reviewed during this encounter.       Pharmacotherapy: No side-effects or adverse reactions reported. Compliance: No problems identified. Effectiveness: Clinically acceptable.  Chauncey Fischer, RN  04/26/2022  8:08 AM  Sign when Signing Visit Safety precautions to be maintained throughout the outpatient stay will include: orient to surroundings, keep bed in low position, maintain call bell within reach at all times, provide assistance with transfer out of bed and ambulation.     No results found for: "CBDTHCR" No results found for: "D8THCCBX" No results found for: "D9THCCBX"  UDS:  Summary  Date Value Ref Range Status  09/12/2021 Note  Final    Comment:    ==================================================================== [employer] Select 13 (MW) ==================================================================== Test  Result       Flag        Units  Drug Present and Declared for Prescription Verification   Alprazolam                     434          EXPECTED   ng/mg creat   Alpha-hydroxyalprazolam        264          EXPECTED   ng/mg creat    Source of alprazolam is a scheduled prescription medication. Alpha-    hydroxyalprazolam is an expected metabolite of alprazolam.    Morphine                       >11111       EXPECTED   ng/mg creat   Normorphine                    951          EXPECTED   ng/mg creat    Potential sources of large amounts of morphine in the absence of    codeine include administration of morphine or use of heroin.     Normorphine is an expected metabolite of morphine.    Hydromorphone                  407          EXPECTED   ng/mg creat    Hydromorphone may be present as a metabolite of morphine;    concentrations of hydromorphone rarely exceed 5% of the morphine    concentration when this is the source of hydromorphone.  ==================================================================== Test                      Result    Flag   Units      Ref Range   Creatinine              90               mg/dL      >=20 ==================================================================== Declared Medications:  The flagging and interpretation on this report are based on the  following declared medications.  Unexpected results may arise from  inaccuracies in the declared medications.   **Note: The testing scope of this panel includes these medications:   Alprazolam (Xanax)  Morphine (MSIR)   **Note: The testing scope of this panel does not include the  following reported medications:   Amiodarone (Pacerone)  Apixaban (Eliquis)  Buspirone (Buspar)  Clonidine  Diltiazem  Eye Drops  Linaclotide (Linzess)  Menthol  Montelukast (Singulair)  Omeprazole (Prilosec)  Ondansetron (Zofran)  Pancrelipase (Creon)  Polyethylene Glycol (MiraLAX)  Sodium Chloride  Valacyclovir  (Valtrex) ==================================================================== For clinical consultation, please call [phone]. ====================================================================       ROS  Constitutional: Denies any fever or chills Gastrointestinal: No reported hemesis, hematochezia, vomiting, or acute GI distress Musculoskeletal: Denies any acute onset joint swelling, redness, loss of ROM, or weakness Neurological: No reported episodes of acute onset apraxia, aphasia, dysarthria, agnosia, amnesia, paralysis, loss of coordination, or loss of consciousness  Medication Review  ALPRAZolam, acetaminophen, apixaban, busPIRone, dexlansoprazole, diltiazem, linaclotide, lipase/protease/amylase, montelukast, morphine, naloxone, and valACYclovir  History Review  Allergy: Ms. Hovde is allergic to amoxicillin, chlorhexidine gluconate, clindamycin, codeine, erythromycin, penicillin g, sulfa antibiotics, levofloxacin, shellfish allergy, decadron [dexamethasone], mangifera indica, papaya derivatives, betadine [povidone iodine], clarithromycin, other, povidone-iodine, and  prednisone. Drug: Ms. Chiao  reports current drug use. Alcohol:  reports no history of alcohol use. Tobacco:  reports that she has never smoked. She has never used smokeless tobacco. Social: Ms. Lathen  reports that she has never smoked. She has never used smokeless tobacco. She reports current drug use. She reports that she does not drink alcohol. 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), Anemia, Anxiety, Asthma, Cephalalgia (08/18/2014), Cervical disc disease (08/18/2014), Chronic headaches, Chronic, continuous use of opioids, DDD (degenerative disc disease), Depression, Dissociative disorder, Dizziness (04/22/2013), Duodenal ulcer with hemorrhage and perforation ([place]) (04/27/2003), Foot drop,  right, GERD (gastroesophageal reflux disease), H/O arthrodesis (08/18/2014), Headache(784.0), History of blood transfusion, History of cardiac cath, History of cardioversion, History of cervical spinal surgery (01/04/2015), History of kidney stones, Hypertension, Inverted T wave, Iron deficiency (02/01/2021), Mitral regurgitation, Narrowing of intervertebral disc space (08/18/2014), Orthostatic hypotension (04/22/2013), Pain, Paroxysmal atrial fibrillation ([place]) (2021), Pneumonia, PONV (postoperative nausea and vomiting), Postop Hyponatremia (05/14/2012), Postoperative anemia due to acute blood loss (05/14/2012), PTSD (post-traumatic stress disorder), Right foot drop, Right hip arthralgia (08/18/2014), Sleep apnea (2021), Therapeutic opioid-induced constipation (OIC), and Typical atrial flutter ([place]). Surgical: Ms. Welby  has a past surgical history that includes RIGHT HIP ARTHROSCOPY FOR LABRAL TEAR (ABOUT 2010); Anterior fusion cervical spine (MAY 2012); Nasal septum surgery (MARCH 2013); Back surgery (2009); Tonsillectomy; Abdominal hysterectomy; DIAGNOSTIC LAPAROSCOPIES - MULTIPLE FOR ENDOMETRIOSIS; Cholecystectomy; Hip arthroscopy (09/20/2011); Shoulder arthroscopy (05-06-12); Carpal tunnel release (05-06-12); Total hip arthroplasty (Right, 05/13/2012); Anterior cervical decomp/discectomy fusion (N/A, 10/30/2012); Posterior cervical fusion/foraminotomy (N/A, 04/16/2013); Breast reduction surgery (Bilateral, 06/2016); Colonoscopy with propofol (N/A, 01/26/2017); Esophagogastroduodenoscopy (egd) with propofol (N/A, 01/26/2017); Esophageal dilation (N/A, 01/26/2017); polypectomy (N/A, 01/26/2017); PH impedance study (N/A, 05/30/2017); Esophageal manometry (N/A, 05/30/2017); Radiofrequency ablation nerves; afib (05/2019); LEFT HEART CATH AND CORONARY ANGIOGRAPHY (Left, 06/10/2019); Cardioversion (N/A, 12/03/2019); Esophagogastroduodenoscopy (egd) with propofol (N/A, 01/30/2020); Reduction mammaplasty (Bilateral, 05/2016);  Breast biopsy (Right, 2008); Breast excisional biopsy (Left, 1998); TEE without cardioversion (N/A, 08/05/2020); ATRIAL FIBRILLATION ABLATION (N/A, 08/06/2020); Cardiac electrophysiology mapping and ablation; Esophagogastroduodenoscopy (egd) with propofol (N/A, 01/15/2021); Colonoscopy with propofol (N/A, 01/16/2021); Cardioversion (N/A, 08/01/2021); Cardioversion (N/A, 08/05/2021); Esophagogastroduodenoscopy (egd) with propofol (N/A, 12/07/2021); and Lower Extremity Angiography (Left, 01/06/2022). Family: family history includes Aneurysm in her maternal grandmother and mother; Anxiety disorder in her grandchild; Bipolar disorder in her grandchild and sister; Breast cancer (age of onset: 20) in her paternal grandmother; Depression in her grandchild.  Laboratory Chemistry Profile   Renal Lab Results  Component Value Date   BUN 12 01/07/2022   CREATININE 0.82 01/07/2022   BCR 9 (L) 01/19/2021   GFRAA >60 11/26/2019   GFRNONAA >60 01/07/2022    Hepatic Lab Results  Component Value Date   AST 12 (L) 01/04/2022   ALT 10 01/04/2022   ALBUMIN 3.3 (L) 01/04/2022   ALKPHOS 87 01/04/2022   LIPASE 26 08/08/2021    Electrolytes Lab Results  Component Value Date   NA 139 01/07/2022   K 3.8 01/07/2022   CL 109 01/07/2022   CALCIUM 8.1 (L) 01/07/2022   MG 2.3 08/08/2021    Bone Lab Results  Component Value Date   25OHVITD1 23 (L) 11/11/2018   25OHVITD2 9.8 11/11/2018   25OHVITD3 13 11/11/2018    Inflammation (CRP: Acute Phase) (ESR: Chronic Phase) Lab Results  Component Value Date   CRP 12 (H) 11/11/2018   ESRSEDRATE 42 (H) 11/11/2018   LATICACIDVEN 1.0  01/14/2021         Note: Above Lab results reviewed.  Recent Imaging Review  VAS [place] GROIN PSEUDOANEURYSM  ARTERIAL PSEUDOANEURYSM    Patient Name:  ALTO MONGOLD  Date of Exam:   02/13/2022 Medical Rec #: [id number]         Accession #:    [id number] Date of Birth: 04/11/1960         Patient Gender: F Patient Age:   62 years Exam  Location:  [facility] Procedure:      VAS [place] Gloriajean Dell Referring Phys: Hortencia Pilar  --------------------------------------------------------------------------------   Exam: Left groin Indications: Patient complains of groin pain and palpable knot. 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 Performing Technologist: Delorise Shiner RVT    Examination Guidelines: A complete evaluation includes B-mode imaging, spectral Doppler, color Doppler, and power Doppler as needed of all accessible portions of each vessel. Bilateral testing is considered an integral part of a complete examination. Limited examinations for reoccurring indications may be performed as noted.  +-----------+----------+---------+------+----------+ Left DuplexPSV (cm/s)Waveform PlaqueComment(s) +-----------+----------+---------+------+----------+ CFA           120    triphasic                 +-----------+----------+---------+------+----------+  Left Vein comments: CFV is patent. 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    Diagnosing physician: Hortencia Pilar MD Electronically signed by Hortencia Pilar MD on 02/16/2022 at 12:33:39 PM.      --------------------------------------------------------------------------------      Final   Note: Reviewed        Physical Exam  General appearance: Well nourished, well developed, and well hydrated. In no apparent acute distress Mental status: Alert, oriented x 3 (person, place, & time)       Respiratory: No evidence of acute respiratory distress Eyes: PERLA Vitals: BP 120/89   Pulse 66   Temp (!) 97.2 F (36.2 C)   Ht 5' 4"$  (1.626 m)   Wt 155 lb (70.3 kg)   SpO2 100%   BMI 26.61 kg/m  BMI: Estimated body mass index is 26.61  kg/m as calculated from the following:   Height as of this encounter: 5' 4"$  (1.626 m).   Weight as of this encounter: 155 lb (70.3 kg). Ideal: Ideal body weight: 54.7 kg (120 lb 9.5 oz) Adjusted ideal body weight: 60.9 kg (134 lb 5.7 oz)  Assessment   Diagnosis Status  1. Chronic shoulder pain (Left)   2. Chronic shoulder radicular pain (Bilateral) (L>R)   3. DDD (degenerative disc disease), cervical   4. Cervical radiculitis (C4,C5,C8) (Left)   5. Abnormal MRI, cervical spine (05/12/2020)   6. Abnormal MRI, shoulder (Left: 03/20/2017) (Right: 12/11/2020)   7. Chronic anticoagulation (Eliquis)    Controlled Controlled Controlled   Updated Problems: Problem  Abnormal MRI, shoulder (Left: 03/20/2017) (Right: 12/11/2020)   (12/11/2020) RIGHT SHOULDER MRI FINDINGS: Rotator cuff: Intact rotator cuff. Mild distal supraspinatus and infraspinatus tendinosis.   Muscles: Edema around the subscapularis myotendinous junction. No muscle atrophy.   Biceps long head:  Intact and normally positioned.   Acromioclavicular Joint: Mild arthropathy of the acromioclavicular joint. Type I acromion. No subacromial/subdeltoid bursal fluid.   Glenohumeral Joint: Diffuse cartilage thinning.  No joint effusion.   Labrum: Grossly intact, but evaluation is limited by lack of  intraarticular fluid.   Bones:  No marrow abnormality, fracture or dislocation.   Other: None.   IMPRESSION: 1. Subscapularis muscle strain. 2. Intact rotator cuff. Mild supraspinatus and infraspinatus tendinosis. 3. Mild acromioclavicular and glenohumeral osteoarthritis.   (03/20/2017) LEFT SHOULDER MRI FINDINGS: Examination is somewhat limited by patient motion. Rotator cuff: Mild to moderate rotator cuff tendinopathy/tendinosis but no partial or full-thickness tear. Muscles: Edema like signal abnormality in the supraspinatus muscle could be a muscle strain, partial tear or denervation process. The other muscles appear  normal. Biceps long head:  Intact Acromioclavicular Joint: Mild degenerative changes type 1-2 acromion. No lateral downsloping or undersurface spurring. Glenohumeral Joint: No degenerative changes or joint effusion. There is thickening of the capsular structures in the axillary recess which can be seen with adhesive capsulitis or synovitis. Labrum:  No obvious labral tears. Bones:  No acute bony findings. Other: Mild subacromial/subdeltoid bursitis.   IMPRESSION: 1. Mild to moderate rotator cuff tendinopathy/tendinosis but no partial or full-thickness tear. 2. Intact long head biceps tendon and glenoid labrum. 3. Mild edema like signal abnormality in the supraspinatus muscle could be due to a muscle strain, partial tear or denervation process. 4. No significant findings for bony impingement. 5. Mild subacromial/subdeltoid bursitis.     Plan of Care  Problem-specific:  No problem-specific Assessment & Plan notes found for this encounter.  Ms. TAIESHA CARE has a current medication list which includes the following long-term medication(s): apixaban, dexlansoprazole, diltiazem, linzess, montelukast, morphine, [START ON 05/22/2022] morphine, and morphine.  Pharmacotherapy (Medications Ordered): No orders of the defined types were placed in this encounter.  Orders:  Orders Placed This Encounter  Procedures   Radiofrequency Shoulder Joint    For shoulder pain.    Standing Status:   Future    Standing Expiration Date:   07/25/2022    Scheduling Instructions:     Procedure: Suprascapular Nerve Radiofrequency Ablation     Laterality: Left-sided     Level(s): Suprascapular nerve     Sedation: With Sedation     Scheduling Timeframe: As soon as pre-approved    Order Specific Question:   Where will this procedure be performed?    Answer:   [facility]   Blood Thinner Instructions to Nursing    Always make sure patient has clearance from prescribing physician to stop blood  thinners for interventional therapies. If the patient requires a Lovenox-bridge therapy, make sure arrangements are made to institute it with the assistance of the PCP.    Scheduling Instructions:     Have Ms. Razey stop the Eliquis (Apixaban) x 3 days prior to procedure or surgery.   Follow-up plan:   Return for (ECT): (L) SSN RFA #3, (Blood Thinner Protocol).     Interventional Therapies  Risk Factors  Considerations:   ELIQUIS ANTICOAGULATION: (Stop: 3 days  Restart: 6 hours) ALLERGY: CONTRAST; shellfish; STEROIDS (dexamethasone, prednisone); Iodine HARDWARE: Not a candidate for RFA or SCS (cervical or lumbar). Patient not interested in IT pump (due to surgical risks). Concomitant BENZO therapy.  (At risk of respiratory depression) A-fib/flutter; unstable angina; CAD; (+) Cardiac cath; (+) cardioversion; mitral regurgitation Multiple allergies; OSA; GERD; IBS; OIC Depression; dissociative disorder; PTSD        Planned  Pending:   Therapeutic left suprascapular nerve RFA #3    Under consideration:   Therapeutic left suprascapular nerve RFA #3  Diagnostic right suprascapular NB #1 (without steroids)  Possible left cervical ESI #1 (With steroids, if suprascapular nerve RFA does not  eliminate all of the pain in the left shoulder.)   Completed (No Steroids):   Diagnostic bilateral lumbar facet MBB x2 (No Steroids)) (01/14/2019) (100/100/100/0)  Therapeutic bilateral suprascapular NB x2 (No Steroids) (06/19/2016) (80/80/0)  Therapeutic left suprascapular nerve RFA x2 (05/13/2020) (100/100/20/80) (shoulder blade pain from C5)    Therapeutic  Palliative (PRN) options:   Diagnostic bilateral lumbar facet MBB #2  Palliative bilateral suprascapular NB #3 (No Steroids) Palliative left suprascapular nerve RFA #2    Pharmacotherapy  Failed trial of membrane stabilizers (Neurontin/Lyrica).       Recent Visits Date Type Provider Dept  03/15/22 Office Visit Milinda Pointer, MD  [facility]  Showing recent visits within past 90 days and meeting all other requirements Today's Visits Date Type Provider Dept  04/26/22 Office Visit Milinda Pointer, MD [facility]  Showing today's visits and meeting all other requirements Future Appointments Date Type Provider Dept  06/14/22 Appointment Milinda Pointer, MD [facility]  Showing future appointments within next 90 days and meeting all other requirements  I discussed the assessment and treatment plan with the patient. The patient was provided an opportunity to ask questions and all were answered. The patient agreed with the plan and demonstrated an understanding of the instructions.  Patient advised to call back or seek an in-person evaluation if the symptoms or condition worsens.  Duration of encounter: 35 minutes.  Total time on encounter, as per AMA guidelines included both the face-to-face and non-face-to-face time personally spent by the physician and/or other qualified health care professional(s) on the day of the encounter (includes time in activities that require the physician or other qualified health care professional and does not include time in activities normally performed by clinical staff). Physician's time may include the following activities when performed: Preparing to see the patient (e.g., pre-charting review of records, searching for previously ordered imaging, lab work, and nerve conduction tests) Review of prior analgesic pharmacotherapies. Reviewing PMP Interpreting ordered tests (e.g., lab work, imaging, nerve conduction tests) Performing post-procedure evaluations, including interpretation of diagnostic procedures Obtaining and/or reviewing separately obtained history Performing a medically appropriate examination and/or evaluation Counseling and educating the patient/family/caregiver Ordering medications, tests, or procedures Referring and communicating with  other health care professionals (when not separately reported) Documenting clinical information in the electronic or other health record Independently interpreting results (not separately reported) and communicating results to the patient/ family/caregiver Care coordination (not separately reported)  Note by: Gaspar Cola, MD Date: 04/26/2022; Time: 10:56 AM

## 2022-04-27 NOTE — Telephone Encounter (Signed)
I can approve holding it for the procedure, but she also just had a vascular procedure with Dr Delana Meyer in October, so she needs him to approve it too.

## 2022-05-04 MED FILL — Iron Sucrose Inj 20 MG/ML (Fe Equiv): INTRAVENOUS | Qty: 10 | Status: AC

## 2022-05-05 ENCOUNTER — Encounter: Payer: Self-pay | Admitting: Internal Medicine

## 2022-05-05 ENCOUNTER — Inpatient Hospital Stay: Payer: Medicare Other | Attending: Internal Medicine | Admitting: Internal Medicine

## 2022-05-05 ENCOUNTER — Inpatient Hospital Stay: Payer: Medicare Other

## 2022-05-05 VITALS — BP 134/81 | HR 73 | Temp 98.0°F | Resp 18 | Wt 158.9 lb

## 2022-05-05 DIAGNOSIS — D509 Iron deficiency anemia, unspecified: Secondary | ICD-10-CM | POA: Insufficient documentation

## 2022-05-05 DIAGNOSIS — E611 Iron deficiency: Secondary | ICD-10-CM | POA: Diagnosis not present

## 2022-05-05 DIAGNOSIS — K580 Irritable bowel syndrome with diarrhea: Secondary | ICD-10-CM | POA: Diagnosis not present

## 2022-05-05 DIAGNOSIS — Z7901 Long term (current) use of anticoagulants: Secondary | ICD-10-CM | POA: Insufficient documentation

## 2022-05-05 DIAGNOSIS — I4891 Unspecified atrial fibrillation: Secondary | ICD-10-CM | POA: Insufficient documentation

## 2022-05-05 LAB — CBC WITH DIFFERENTIAL/PLATELET
Abs Immature Granulocytes: 0.01 10*3/uL (ref 0.00–0.07)
Basophils Absolute: 0.1 10*3/uL (ref 0.0–0.1)
Basophils Relative: 1 %
Eosinophils Absolute: 0.1 10*3/uL (ref 0.0–0.5)
Eosinophils Relative: 1 %
HCT: 42.1 % (ref 36.0–46.0)
Hemoglobin: 14.2 g/dL (ref 12.0–15.0)
Immature Granulocytes: 0 %
Lymphocytes Relative: 26 %
Lymphs Abs: 1.5 10*3/uL (ref 0.7–4.0)
MCH: 29.5 pg (ref 26.0–34.0)
MCHC: 33.7 g/dL (ref 30.0–36.0)
MCV: 87.5 fL (ref 80.0–100.0)
Monocytes Absolute: 0.4 10*3/uL (ref 0.1–1.0)
Monocytes Relative: 7 %
Neutro Abs: 3.9 10*3/uL (ref 1.7–7.7)
Neutrophils Relative %: 65 %
Platelets: 371 10*3/uL (ref 150–400)
RBC: 4.81 MIL/uL (ref 3.87–5.11)
RDW: 13.2 % (ref 11.5–15.5)
WBC: 5.9 10*3/uL (ref 4.0–10.5)
nRBC: 0 % (ref 0.0–0.2)

## 2022-05-05 LAB — IRON AND TIBC
Iron: 58 ug/dL (ref 28–170)
Saturation Ratios: 12 % (ref 10.4–31.8)
TIBC: 496 ug/dL — ABNORMAL HIGH (ref 250–450)
UIBC: 438 ug/dL

## 2022-05-05 LAB — BASIC METABOLIC PANEL
Anion gap: 11 (ref 5–15)
BUN: 10 mg/dL (ref 8–23)
CO2: 24 mmol/L (ref 22–32)
Calcium: 9.2 mg/dL (ref 8.9–10.3)
Chloride: 102 mmol/L (ref 98–111)
Creatinine, Ser: 0.95 mg/dL (ref 0.44–1.00)
GFR, Estimated: >60 mL/min (ref >60)
Glucose, Bld: 121 mg/dL — ABNORMAL HIGH (ref 70–99)
Potassium: 3.7 mmol/L (ref 3.5–5.1)
Sodium: 137 mmol/L (ref 135–145)

## 2022-05-05 LAB — FERRITIN: Ferritin: 24 ng/mL (ref 11–307)

## 2022-05-05 NOTE — Assessment & Plan Note (Addendum)
#   Anemia- iron deficiency-[NOV 2022] hemoglobin:11 Ferritin-7 .  Poor tolerance to p.o. iron. S/p IV venofer x4-.  Today hemoglobin is 14  Hold off on infusion.   # Etiology: Unclear etiology.  Status post EGD/colonoscopy-2022-monitor for now.  # IBS-Diarrhea-?  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  # Hypocalcemia-8.7; continue dietary supplementation. Patient poor tolerance to calcium plus vitamin D. Pending las today.   # DISPOSITION: # no venofer  # follow up in 6 months- MD: labs- cbc/bmp;iron studies/ferritin- possible IV venofer-Dr.B

## 2022-05-05 NOTE — Progress Notes (Signed)
Patient is feeling very tired all the time.  Reports multiple hospital admissions since last visit with A-fib.

## 2022-05-05 NOTE — Progress Notes (Signed)
[place] NOTE  Patient Care Team: Brita Romp Dionne Bucy, MD as PCP - General (Family Medicine) Vickie Epley, MD as PCP - Electrophysiology (Cardiology) Dorann Ou Montine Circle, NP as Nurse Practitioner (Psychiatry) Lucilla Lame, MD as Consulting Physician (Gastroenterology) Jules Husbands, MD as Consulting Physician (General Surgery) Milinda Pointer, MD as Referring Physician (Pain Medicine) Pcp, No Coralyn Helling, DMD (Dentistry) Neldon Labella, RN as Case Manager Cammie Sickle, MD as Consulting Physician (Internal Medicine)  CHIEF COMPLAINTS/PURPOSE OF CONSULTATION: ANEMIA  HEMATOLOGY HISTORY:  #Iron deficiency ANEMIA EGD-gastritis/ colonoscopy- lymphoplasmacytic infiltrateNovember 2022 [Dr. Marius Ditch; in hospital]; capsule/Bone marrow Biopsy-none; [history of chronic iron deficiency anemia- s/p IV iron infusions in the past]; CTA CAP: Negative for any source of bleeding noted.  Atrophic pancreas-?  Etiology;   #Question pancreatic insufficiency-on Creon [nov [id number]  #A. Fib [s/p cardioversion-Eliquis Dr.Agbor]; #Chronic back pain/pain management    Latest Reference Range & Units 01/14/21 06:15  Iron 28 - 170 ug/dL 30  UIBC ug/dL 502  TIBC 250 - 450 ug/dL 532 (H)  Saturation Ratios 10.4 - 31.8 % 6 (L)  Ferritin 11 - 307 ng/mL 7 (L)  Folate >5.9 ng/mL 14.9    HISTORY OF PRESENTING ILLNESS: Alone ambulating independently.  [name] 62 y.o.  female iron deficient anemia unclear etiology; Hx of A-fib on Eliquis is here for follow-up.  Patient is feeling very tired all the time. Reports multiple hospital admissions since last visit with A-fib. S/p ablation.   Gaining weight. Patient not on oral iron because of intolerance.  Complains of constipation/diarrhea-followed by GI.   Review of Systems  Constitutional:  Positive for malaise/fatigue. Negative for chills, diaphoresis, fever and weight loss.  HENT:  Negative for nosebleeds and  sore throat.   Eyes:  Negative for double vision.  Respiratory:  Negative for cough, hemoptysis, sputum production, shortness of breath and wheezing.   Cardiovascular:  Negative for chest pain, palpitations, orthopnea and leg swelling.  Gastrointestinal:  Positive for diarrhea and nausea. Negative for abdominal pain, blood in stool, constipation, heartburn, melena and vomiting.  Genitourinary:  Negative for dysuria, frequency and urgency.  Musculoskeletal:  Negative for back pain and joint pain.  Skin: Negative.  Negative for itching and rash.  Neurological:  Negative for dizziness, tingling, focal weakness, weakness and headaches.  Endo/Heme/Allergies:  Does not bruise/bleed easily.  Psychiatric/Behavioral:  Negative for depression. The patient is not nervous/anxious and does not have insomnia.     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    Chronic headaches    Chronic, continuous use of opioids    DDD (degenerative disc disease)    CERVICAL AND LUMBAR-CHRONIC PAIN, RT HIP LABRAL TEAR   Depression    PT STATES A LOT OF STRESS IN HER LIFE   Dissociative disorder    Dizziness 04/22/2013   Duodenal ulcer with hemorrhage and perforation ([place]) 04/27/2003   Foot drop, right    FROM BACK SURGERY   GERD (gastroesophageal reflux disease)    H/O arthrodesis 08/18/2014  Headache(784.0)    AND NECK PAIN--STATES RECENT TEST SHOW CERVICAL DEGENERATION   History of blood transfusion    s/p back surgery   History of cardiac cath    a. 05/2019 Cath: Nl cors. EF 55-65%.   History of cardioversion    History  of cervical spinal surgery 01/04/2015   History of kidney stones    Hypertension    Inverted T wave    Iron deficiency 02/01/2021   Mitral regurgitation    a. 07/2020 Echo: EF 60-65%, no rwma. Nl RV size/fxn. RVSP 39.100mHg. Mildly to mod dil LA. Mod MR; b. 07/2020 TEE: EF 55-60%. Lambl's excresence. Nl RV size/fxn. Midly dil LA. No LA/LAA thrombus. Mild MR.   Narrowing of intervertebral disc space 08/18/2014   Currently on disability.    Orthostatic hypotension 04/22/2013   Pain    CHRONIC NECK AND BACK PAIN - LIMITED ROM NECK - S/P FUSIONS CERVICAL AND LUMBAR   [place]   a. 07/2020 s/p PVI.   Pneumonia    PONV (postoperative nausea and vomiting)    PT GIVES HX OF N&V AND FEVER WITH SURGERIES YEARS AGO--BUT NO PROBLEMS WITH MORE RECENT SURGERIES--STATES NOT MALIGNANT HYPERTHERMIA   Postop Hyponatremia 05/14/2012   Postoperative anemia due to acute blood loss 05/14/2012   PTSD (post-traumatic stress disorder)    Right foot drop    Right hip arthralgia 08/18/2014   Status post surgery of right, and now needs left.    Sleep apnea 2021   does not have a cpap   Therapeutic opioid-induced constipation ([facility])    Typical atrial flutter (HCC)     SURGICAL HISTORY: Past Surgical History:  Procedure Laterality Date   ABDOMINAL HYSTERECTOMY     afib  05/2019   ANTERIOR CERVICAL DECOMP/DISCECTOMY FUSION N/A 10/30/2012   Procedure: ACDF C5-6, EXPLORATION AND HARDWARE REMOVAL C6-7;  Surgeon: DMelina Schools MD;  [place] [facility]  Service: Orthopedics;  Laterality: N/A;   [place]IOR [place]   Procedure: ATRIAL FIBRILLATION ABLATION;  Surgeon: LVickie Epley MD;  [facility];  Service: Cardiovascular;  Laterality: N/A;   BACK SURGERY  2009   LUMBAR FUSION WITH RODS    BREAST BIOPSY Right 2008   benign.- bx/clip   BREAST EXCISIONAL BIOPSY Left 1998   benign    BREAST REDUCTION SURGERY Bilateral 06/2016   CARDIAC ELECTROPHYSIOLOGY MAPPING AND ABLATION     CARDIOVERSION N/A 12/03/2019   Procedure: CARDIOVERSION;  Surgeon: AKate Sable MD;  [facility];  Service: Cardiovascular;  Laterality: N/A;   CARDIOVERSION N/A 08/01/2021   Procedure: CARDIOVERSION;  Surgeon: AKate Sable MD;  Location: [facility];  Service: Cardiovascular;  Laterality: N/A;   CARDIOVERSION N/A 08/05/2021   Procedure: CARDIOVERSION;  Surgeon: GMinna Merritts MD;  Location: [facility];  Service: Cardiovascular;  Laterality: N/A;   CARPAL TUNNEL RELEASE  05-06-12   Right   CHOLECYSTECTOMY     COLONOSCOPY WITH PROPOFOL N/A 01/26/2017   Procedure: COLONOSCOPY WITH PROPOFOL;  Surgeon: WLucilla Lame MD;  [place] [facility]  Service: Endoscopy;  Laterality: N/A;   COLONOSCOPY WITH PROPOFOL N/A 01/16/2021   Procedure: COLONOSCOPY WITH PROPOFOL;  Surgeon: VLin Landsman MD;  [facility];  Service: Gastroenterology;  Laterality: N/A;   DIAGNOSTIC LAPAROSCOPIES - MULTIPLE FOR ENDOMETRIOSIS     ESOPHAGEAL DILATION N/A 01/26/2017   Procedure: ESOPHAGEAL DILATION;  Surgeon: WAllen Norris  Darren, MD;  Location: [place];  Service: Endoscopy;  Laterality: N/A;   ESOPHAGEAL MANOMETRY N/A 05/30/2017   Procedure: ESOPHAGEAL MANOMETRY (EM);  Surgeon: Lucilla Lame, MD;  Location: [facility];  Service: Endoscopy;  Laterality: N/A;   ESOPHAGOGASTRODUODENOSCOPY (EGD) WITH PROPOFOL N/A 01/26/2017   Procedure: ESOPHAGOGASTRODUODENOSCOPY (EGD) WITH PROPOFOL;  Surgeon: Lucilla Lame, MD;  Location: [facility];  Service: Endoscopy;  Laterality: N/A;   ESOPHAGOGASTRODUODENOSCOPY (EGD) WITH PROPOFOL N/A 01/30/2020   Procedure: ESOPHAGOGASTRODUODENOSCOPY (EGD) WITH PROPOFOL;  Surgeon: Lucilla Lame, MD;  Location: [facility];  Service: Endoscopy;  Laterality: N/A;  sleep apnea COVID + 01-15-20   ESOPHAGOGASTRODUODENOSCOPY (EGD) WITH PROPOFOL  N/A 01/15/2021   Procedure: ESOPHAGOGASTRODUODENOSCOPY (EGD) WITH PROPOFOL;  Surgeon: Lin Landsman, MD;  Location: [place] [place];  Service: Gastroenterology;  Laterality: N/A;   ESOPHAGOGASTRODUODENOSCOPY (EGD) WITH PROPOFOL N/A 12/07/2021   Procedure: ESOPHAGOGASTRODUODENOSCOPY (EGD) WITH PROPOFOL;  Surgeon: Lin Landsman, MD;  Location[facility];  Service: Gastroenterology;  Laterality: N/A;   HIP ARTHROSCOPY  09/20/2011   Procedure: ARTHROSCOPY HIP;  Surgeon: Gearlean Alf, MD;  [facility];  Service: Orthopedics;  Laterality: Right;  Right Hip Scope with Labral Debridement   LEFT HEART CATH AND CORONARY ANGIOGRAPHY Left 06/10/2019   Procedure: LEFT HEART CATH AND CORONARY ANGIOGRAPHY;  Surgeon: Nelva Bush, MD;  Location: [place] [facility];  Service: Cardiovascular;  Laterality: Left;   LOWER EXTREMITY ANGIOGRAPHY Left 01/06/2022   Procedure: Lower Extremity Angiography;  Surgeon: Katha Cabal, MD;  Location: [place] [facility];  Service: Cardiovascular;  Laterality: Left;   NASAL SEPTUM SURGERY  MARCH 2013   IN [place]   [facility] IMPEDANCE STUDY N/A 05/30/2017   Procedure: [place] IMPEDANCE STUDY;  Surgeon: Lucilla Lame, MD;  Location: [facility];  Service: Endoscopy;  Laterality: N/A;   POLYPECTOMY N/A 01/26/2017   Procedure: POLYPECTOMY;  Surgeon: Lucilla Lame, MD;  Location: [place];  Service: Endoscopy;  Laterality: N/A;   POSTERIOR CERVICAL FUSION/FORAMINOTOMY N/A 04/16/2013   Procedure: REMOVAL CERVICAL PLATES AND INTERBODY CAGE/POSTERIOR CERVICAL SPINAL FUSION C4 - C6/C5 CORPECTOMY/C4 - C6 FUSION WITH ILIAC CREST BONE GRAFT;  Surgeon: Melina Schools, MD;  Location: [place];  Service: Orthopedics;  Laterality: N/A;   RADIOFREQUENCY ABLATION NERVES     REDUCTION MAMMAPLASTY Bilateral 05/2016   RIGHT HIP ARTHROSCOPY FOR LABRAL TEAR  ABOUT 2010   2012 also   SHOULDER ARTHROSCOPY  05-06-12   bone spur   TEE WITHOUT CARDIOVERSION N/A 08/05/2020    Procedure: TRANSESOPHAGEAL ECHOCARDIOGRAM (TEE);  Surgeon: Lelon Perla, MD;  [facility];  Service: Cardiovascular;  Laterality: N/A;   TONSILLECTOMY     AS A CHILD   TOTAL HIP ARTHROPLASTY Right 05/13/2012   Procedure: TOTAL HIP ARTHROPLASTY ANTERIOR APPROACH;  Surgeon: Gearlean Alf, MD;  [facility];  Service: Orthopedics;  Laterality: Right;    SOCIAL HISTORY: Social History   Socioeconomic History   Marital status: Married    Spouse name: bruce   Number of children: 2   Years of education: Not on file   Highest education level: Associate degree: occupational, Hotel manager, or vocational program  Occupational History    Comment: disabled  Tobacco Use   Smoking status: Never   Smokeless tobacco: Never  Vaping Use   Vaping Use: Never used  Substance and Sexual Activity   Alcohol use: No    Alcohol/week: 0.0 standard drinks of alcohol   Drug use: Yes    Comment: prescribed morphine  and xanax   Sexual activity: Yes  Other Topics Concern   Not on file  Social History Narrative   Son, daughter  And 4 grandkids; raises 3 of the grandchildren      Lives in [place]; with husband; never smoked; no alcohol; 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    Social Determinants of Health   Financial Resource Strain: Low Risk  (02/08/2021)   Overall Financial Resource Strain (CARDIA)    Difficulty of Paying Living Expenses: Not hard at all  Food Insecurity: No Food Insecurity (01/03/2022)   Hunger Vital Sign    Worried About Running Out of Food in the Last Year: Never true    Ran Out of Food in the Last Year: Never true  Transportation Needs: No Transportation Needs (01/03/2022)   PRAPARE - Hydrologist (Medical): No    Lack of Transportation (Non-Medical): No  Physical Activity: Insufficiently Active (02/08/2021)   Exercise Vital Sign    Days of Exercise per Week: 2 days    Minutes of Exercise per Session: 20 min  Stress: No Stress Concern Present  (02/08/2021)   [place]    Feeling of Stress : Only a little  Social Connections: Moderately Integrated (02/08/2021)   Social Connection and Isolation Panel [NHANES]    Frequency of Communication with Friends and Family: More than three times a week    Frequency of Social Gatherings with Friends and Family: Never    Attends Religious Services: 1 to 4 times per year    Active Member of [employer] or Organizations: No    Attends Archivist Meetings: Never    Marital Status: Married  Human resources officer Violence: Not At Risk (01/03/2022)   Humiliation, Afraid, Rape, and Kick questionnaire    Fear of Current or Ex-Partner: No    Emotionally Abused: No    Physically Abused: No    Sexually Abused: No    FAMILY HISTORY: Family History  Problem Relation Age of Onset   Aneurysm Mother    Bipolar disorder Sister    Aneurysm Maternal Grandmother    Breast cancer Paternal Grandmother 2   Bipolar disorder Grandchild    Anxiety disorder Grandchild    Depression Grandchild    Cancer Neg Hx     ALLERGIES:  is allergic to amoxicillin, chlorhexidine gluconate, clindamycin, codeine, erythromycin, penicillin g, sulfa antibiotics, levofloxacin, shellfish allergy, decadron [dexamethasone], mangifera indica, papaya derivatives, betadine [povidone iodine], clarithromycin, other, povidone-iodine, and prednisone.  MEDICATIONS:  Current Outpatient Medications  Medication Sig Dispense Refill   acetaminophen (TYLENOL) 325 MG tablet Take 2 tablets (650 mg total) by mouth every 6 (six) hours as needed for mild pain (or Fever >/= 101).     ALPRAZolam (XANAX) 1 MG tablet Take 1 mg by mouth at bedtime.      apixaban (ELIQUIS) 5 MG TABS tablet Take 1 tablet (5 mg total) by mouth 2 (two) times daily. 60 tablet 5   busPIRone (BUSPAR) 15 MG tablet Take 15 mg by mouth 2 (two) times daily.     CREON 36000-114000 units CPEP capsule TAKE 2  CAPSULES WITH THE FIRST BITE OF EACH MEAL AND 1 CAPSULE WITH THE FIRST BITE OF EACH SNACK 720 capsule 0   dexlansoprazole (DEXILANT) 60 MG capsule TAKE 1 CAPSULE BY MOUTH EVERY DAY 90 capsule 1   diltiazem (CARDIZEM CD) 120 MG 24 hr capsule Take 1 capsule (120 mg total) by mouth daily. (Patient taking  differently: Take 240 mg by mouth daily.) 30 capsule 1   linaclotide (LINZESS) 290 MCG CAPS capsule TAKE 1 CAPSULE BY MOUTH DAILY BEFORE BREAKFAST. 30 capsule 5   montelukast (SINGULAIR) 10 MG tablet TAKE 1 TABLET BY MOUTH EVERY DAY 90 tablet 1   morphine (MSIR) 15 MG tablet Take 1 tablet (15 mg total) by mouth every 6 (six) hours as needed for moderate pain or severe pain. Must last 30 days. 120 tablet 0   morphine (MSIR) 15 MG tablet Take 1 tablet (15 mg total) by mouth every 6 (six) hours as needed for moderate pain or severe pain. Must last 30 days. 120 tablet 0   [START ON 05/22/2022] morphine (MSIR) 15 MG tablet Take 1 tablet (15 mg total) by mouth every 6 (six) hours as needed for moderate pain or severe pain. Must last 30 days. 120 tablet 0   naloxone (NARCAN) nasal spray 4 mg/0.1 mL Place 1 spray into the nose as needed for up to 365 doses (for opioid-induced respiratory depresssion). In case of emergency (overdose), spray once into each nostril. If no response within 3 minutes, repeat application and call [id number]. 1 each 0   valACYclovir (VALTREX) 1000 MG tablet TAKE TWO TABLETS BY MOUTH TWICE DAILY FOR ONE DAY FEVER BLISTER 4 tablet 5   No current facility-administered medications for this visit.      PHYSICAL EXAMINATION:   Vitals:   05/05/22 1300  BP: 134/81  Pulse: 73  Resp: 18  Temp: 98 F (36.7 C)   Filed Weights   05/05/22 1300  Weight: 158 lb 14.4 oz (72.1 kg)  Irregular rhythm.  Physical Exam Vitals and nursing note reviewed.  HENT:     Head: Normocephalic and atraumatic.     Mouth/Throat:     Pharynx: Oropharynx is clear.  Eyes:     Extraocular Movements:  Extraocular movements intact.     Pupils: Pupils are equal, round, and reactive to light.  Cardiovascular:     Rate and Rhythm: Normal rate.  Pulmonary:     Comments: Decreased breath sounds bilaterally.  Abdominal:     Palpations: Abdomen is soft.  Musculoskeletal:        General: Normal range of motion.     Cervical back: Normal range of motion.  Skin:    General: Skin is warm.  Neurological:     General: No focal deficit present.     Mental Status: She is alert and oriented to person, place, and time.  Psychiatric:        Behavior: Behavior normal.        Judgment: Judgment normal.     LABORATORY DATA:  I have reviewed the data as listed Lab Results  Component Value Date   WBC 5.9 05/05/2022   HGB 14.2 05/05/2022   HCT 42.1 05/05/2022   MCV 87.5 05/05/2022   PLT 371 05/05/2022   Recent Labs    07/27/21 1405 07/28/21 0731 08/08/21 2336 11/02/21 1308 01/04/22 0553 01/07/22 0420 05/05/22 1321  NA 139   < > 136   < > 139 139 137  K 3.7   < > 4.1   < > 4.0 3.8 3.7  CL 104   < > 102   < > 108 109 102  CO2 22   < > 21*   < > '24 24 24  '$ GLUCOSE 140*   < > 185*   < > 97 105* 121*  BUN 12   < > 12   < >  $'11 12 10  'i$ CREATININE 0.95   < > 0.90   < > 0.80 0.82 0.95  CALCIUM 9.8   < > 9.7   < > 8.7* 8.1* 9.2  GFRNONAA >60   < > >60   < > >60 >60 >60  PROT 7.9  --  8.0  --  6.0*  --   --   ALBUMIN 4.4  --  4.5  --  3.3*  --   --   AST 19  --  21  --  12*  --   --   ALT 11  --  14  --  10  --   --   ALKPHOS 125  --  127*  --  87  --   --   BILITOT 0.9  --  0.6  --  0.5  --   --    < > = values in this interval not displayed.     No results found.  Iron deficiency # Anemia- iron deficiency-[NOV 2022] hemoglobin:11 Ferritin-7 .  Poor tolerance to p.o. iron. S/p IV venofer x4-.  Today hemoglobin is 14  Hold off on infusion.   # Etiology: Unclear etiology.  Status post EGD/colonoscopy-2022-monitor for now.  # IBS-Diarrhea-?  Atrophic pancreas/pancreatic insufficiency on  Creon; as per GI-   # A.fib 5 mg BID [Dr.Agbor]-rate controlled.- stable.  # Hypocalcemia-8.7; continue dietary supplementation. Patient poor tolerance to calcium plus vitamin D. Pending las today.   # DISPOSITION: # no venofer  # follow up in 6 months- MD: labs- cbc/bmp;iron studies/ferritin- possible IV venofer-Dr.B  All questions were answered. The patient knows to call the clinic with any problems, questions or concerns.    Cammie Sickle, MD 05/05/2022 1:58 PM

## 2022-05-08 ENCOUNTER — Telehealth: Payer: Self-pay | Admitting: Family Medicine

## 2022-05-08 NOTE — Telephone Encounter (Signed)
Judson Roch- can we still work off the initial referral from 2/8 or will the provider have to put in a new one?     Copied from [place] [phone]. Topic: Referral - Status >> May 05, 2022  2:37 PM Chapman Fitch wrote: Reason for CRM: [facility] does not do the [facility] for people with Cpaps anymore and pt was not able to see them for what she needed / pt needs new referral // please advise

## 2022-05-09 ENCOUNTER — Telehealth: Payer: Self-pay

## 2022-05-09 NOTE — Telephone Encounter (Signed)
Sent request for clearance to stop Eliquis  [name]

## 2022-05-12 DIAGNOSIS — I4891 Unspecified atrial fibrillation: Secondary | ICD-10-CM | POA: Diagnosis not present

## 2022-05-12 DIAGNOSIS — I4811 Longstanding persistent atrial fibrillation: Secondary | ICD-10-CM | POA: Diagnosis not present

## 2022-05-12 DIAGNOSIS — I10 Essential (primary) hypertension: Secondary | ICD-10-CM | POA: Diagnosis not present

## 2022-06-06 ENCOUNTER — Ambulatory Visit: Payer: Medicare Other | Attending: Pain Medicine | Admitting: Pain Medicine

## 2022-06-06 ENCOUNTER — Encounter: Payer: Self-pay | Admitting: Pain Medicine

## 2022-06-06 ENCOUNTER — Ambulatory Visit
Admission: RE | Admit: 2022-06-06 | Discharge: 2022-06-06 | Disposition: A | Discharge status: Home / Self Care | Payer: Medicare Other | Source: Ambulatory Visit | Attending: Pain Medicine | Admitting: Pain Medicine

## 2022-06-06 VITALS — BP 126/97 | HR 65 | Temp 97.2°F | Resp 16 | Ht 63.0 in | Wt 162.0 lb

## 2022-06-06 DIAGNOSIS — M7552 Bursitis of left shoulder: Secondary | ICD-10-CM | POA: Insufficient documentation

## 2022-06-06 DIAGNOSIS — M67912 Unspecified disorder of synovium and tendon, left shoulder: Secondary | ICD-10-CM

## 2022-06-06 DIAGNOSIS — Z888 Allergy status to other drugs, medicaments and biological substances status: Secondary | ICD-10-CM | POA: Insufficient documentation

## 2022-06-06 DIAGNOSIS — Z7901 Long term (current) use of anticoagulants: Secondary | ICD-10-CM

## 2022-06-06 DIAGNOSIS — Z91041 Radiographic dye allergy status: Secondary | ICD-10-CM | POA: Insufficient documentation

## 2022-06-06 DIAGNOSIS — G8918 Other acute postprocedural pain: Secondary | ICD-10-CM | POA: Diagnosis not present

## 2022-06-06 DIAGNOSIS — G8929 Other chronic pain: Secondary | ICD-10-CM | POA: Diagnosis not present

## 2022-06-06 DIAGNOSIS — S46812S Strain of other muscles, fascia and tendons at shoulder and upper arm level, left arm, sequela: Secondary | ICD-10-CM | POA: Insufficient documentation

## 2022-06-06 DIAGNOSIS — M25512 Pain in left shoulder: Secondary | ICD-10-CM | POA: Insufficient documentation

## 2022-06-06 DIAGNOSIS — Z91013 Allergy to seafood: Secondary | ICD-10-CM

## 2022-06-06 DIAGNOSIS — M19012 Primary osteoarthritis, left shoulder: Secondary | ICD-10-CM | POA: Insufficient documentation

## 2022-06-06 DIAGNOSIS — R2991 Unspecified symptoms and signs involving the musculoskeletal system: Secondary | ICD-10-CM

## 2022-06-06 MED ORDER — LACTATED RINGERS IV SOLN: Freq: Once | INTRAVENOUS | Status: AC

## 2022-06-06 MED ORDER — MIDAZOLAM HCL 5 MG/5ML IJ SOLN
INTRAMUSCULAR | Status: AC
  Filled 2022-06-06: qty 5

## 2022-06-06 MED ORDER — ROPIVACAINE HCL 2 MG/ML IJ SOLN
INTRAMUSCULAR | Status: AC
  Filled 2022-06-06: qty 20

## 2022-06-06 MED ORDER — LIDOCAINE HCL (PF) 2 % IJ SOLN
INTRAMUSCULAR | Status: AC
  Filled 2022-06-06: qty 10

## 2022-06-06 MED ORDER — OXYCODONE-ACETAMINOPHEN 5-325 MG PO TABS: 1.0000 | ORAL_TABLET | Freq: Three times a day (TID) | ORAL | 0 refills | Status: AC | PRN

## 2022-06-06 MED ORDER — PENTAFLUOROPROP-TETRAFLUOROETH EX AERO: INHALATION_SPRAY | Freq: Once | CUTANEOUS | Status: DC

## 2022-06-06 MED ORDER — ROPIVACAINE HCL 2 MG/ML IJ SOLN
4.0000 mL | Freq: Once | INTRAMUSCULAR | Status: AC
  Administered 2022-06-06: 4 mL via PERINEURAL

## 2022-06-06 MED ORDER — LIDOCAINE HCL 2 % IJ SOLN: 20.0000 mL | Freq: Once | INTRAMUSCULAR | Status: DC

## 2022-06-06 MED ORDER — FENTANYL CITRATE (PF) 100 MCG/2ML IJ SOLN
25.0000 ug | INTRAMUSCULAR | Status: DC | PRN
  Administered 2022-06-06: 100 ug via INTRAVENOUS

## 2022-06-06 MED ORDER — FENTANYL CITRATE (PF) 100 MCG/2ML IJ SOLN
INTRAMUSCULAR | Status: AC
  Filled 2022-06-06: qty 2

## 2022-06-06 MED ORDER — MIDAZOLAM HCL 5 MG/5ML IJ SOLN
0.5000 mg | Freq: Once | INTRAMUSCULAR | Status: AC
  Administered 2022-06-06: 3 mg via INTRAVENOUS

## 2022-06-06 NOTE — Progress Notes (Signed)
PROVIDER NOTE: Interpretation of information contained herein should be left to medically-trained personnel. Specific patient instructions are provided elsewhere under "Patient Instructions" section of medical record. This document was created in part using STT-dictation technology, any transcriptional errors that may result from this process are unintentional.  Patient: [name] Type: Established DOB: [birth year] MRN: [id number] PCP: Virginia Crews, MD  Service: Procedure DOS: 06/06/2022 Setting: Ambulatory Location: Ambulatory outpatient facility Delivery: Face-to-face Provider: Gaspar Cola, MD Specialty: Interventional Pain Management Specialty designation: 09 Location: Outpatient facility Ref. Prov.: Bacigalupo, Dionne Bucy, MD       Interventional Therapy   Procedure: Suprascapular nerve Radiofrequency Ablation (RFA) #3 (NO STEROIDS) Laterality: Left  Level: Superior to the scapular spine, in the lateral aspect of the supraspinatus fossa (Suprescapular notch).  Imaging: Fluoroscopic guidance Anesthesia: Local anesthesia (1-2% Lidocaine) Anxiolysis: IV Versed  Sedation: Moderate conscious sedation.  DOS: 06/06/2022  Performed by: Gaspar Cola, MD  Purpose: Therapeutic Indications: Shoulder pain severe enough to impact quality of life or function. Indications: 1. Chronic shoulder pain (Left)   2. Osteoarthritis of shoulder (Left)   3. Subacromial bursitis of shoulder (Left)   4. Subdeltoid bursitis of shoulder (Left)   5. Tendinopathy of rotator cuff (Left)   6. Symptoms referable to shoulder joint   7. Sprain of supraspinatus muscle or tendon, sequela (Left)    Chronic anticoagulation (Eliquis)    History of allergy to iodine (Povidone)   History of allergy to radiographic contrast media    History of allergy to shellfish    [name] has been dealing with the above chronic pain for longer than three months and has either failed to respond, was  unable to tolerate, or simply did not get enough benefit from other more conservative therapies including, but not limited to: 1. Over-the-counter medications 2. Anti-inflammatory medications 3. Muscle relaxants 4. Membrane stabilizers 5. Opioids 6. Physical therapy and/or chiropractic manipulation 7. Modalities (Heat, ice, etc.) 8. Invasive techniques such as nerve blocks. [name] has attained more than 50% relief of the pain from a series of diagnostic injections conducted in separate occasions.  Pain Score: Pre-procedure: 7 /10 Post-procedure: 0-No pain/10    Position / Prep / Materials:  Position: Prone  Prep solution: DuraPrep (Iodine Povacrylex [0.7% available iodine] and Isopropyl Alcohol, 74% w/w) Prep Area: Entire shoulder area.  This includes the lateral and posterior aspect of the neck, lateral and posterior aspect of the upper third of the upper arm, from the axilla to the spine in the upper back, down to the lower border of the scapula. Materials:  Tray:  RFA (Radiofrequency) tray Needle(s):  Type: RFA (Teflon-coated radiofrequency ablation needles)  Gauge (G): 22  Length: Regular (10cm) Qty: 1  Pre-op H&P Assessment:  [name] is a 62 y.o. (year old), female patient, seen today for interventional treatment. She  has a past surgical history that includes RIGHT HIP ARTHROSCOPY FOR LABRAL TEAR (ABOUT 2010); Anterior fusion cervical spine (MAY 2012); Nasal septum surgery (MARCH 2013); Back surgery (2009); Tonsillectomy; Abdominal hysterectomy; DIAGNOSTIC LAPAROSCOPIES - MULTIPLE FOR ENDOMETRIOSIS; Cholecystectomy; Hip arthroscopy (09/20/2011); Shoulder arthroscopy (05-06-12); Carpal tunnel release (05-06-12); Total hip arthroplasty (Right, 05/13/2012); Anterior cervical decomp/discectomy fusion (N/A, 10/30/2012); Posterior cervical fusion/foraminotomy (N/A, 04/16/2013); Breast reduction surgery (Bilateral, 06/2016); Colonoscopy with propofol (N/A, 01/26/2017);  Esophagogastroduodenoscopy (egd) with propofol (N/A, 01/26/2017); Esophageal dilation (N/A, 01/26/2017); polypectomy (N/A, 01/26/2017); PH impedance study (N/A, 05/30/2017); Esophageal manometry (N/A, 05/30/2017); Radiofrequency ablation nerves; afib (05/2019); LEFT HEART CATH AND CORONARY ANGIOGRAPHY (Left,  06/10/2019); Cardioversion (N/A, 12/03/2019); Esophagogastroduodenoscopy (egd) with propofol (N/A, 01/30/2020); Reduction mammaplasty (Bilateral, 05/2016); Breast biopsy (Right, 2008); Breast excisional biopsy (Left, 1998); TEE without cardioversion (N/A, 08/05/2020); ATRIAL FIBRILLATION ABLATION (N/A, 08/06/2020); Cardiac electrophysiology mapping and ablation; Esophagogastroduodenoscopy (egd) with propofol (N/A, 01/15/2021); Colonoscopy with propofol (N/A, 01/16/2021); Cardioversion (N/A, 08/01/2021); Cardioversion (N/A, 08/05/2021); Esophagogastroduodenoscopy (egd) with propofol (N/A, 12/07/2021); and Lower Extremity Angiography (Left, 01/06/2022). [name] has a current medication list which includes the following prescription(s): acetaminophen, alprazolam, apixaban, buspirone, dexlansoprazole, diltiazem, linzess, montelukast, morphine, naloxone, oxycodone-acetaminophen, [START ON 06/13/2022] oxycodone-acetaminophen, valacyclovir, morphine, and morphine, and the following Facility-Administered Medications: fentanyl, lidocaine, and pentafluoroprop-tetrafluoroeth. Her primarily concern today is the Shoulder Pain (Left )  Initial Vital Signs:  Pulse/HCG Rate: 65ECG Heart Rate: 62 (NSR) Temp: (!) 97.2 F (36.2 C) Resp: 16 BP: (!) 139/93 SpO2: 100 %  BMI: Estimated body mass index is 28.7 kg/m as calculated from the following:   Height as of this encounter: 5\' 3"  (1.6 m).   Weight as of this encounter: 162 lb (73.5 kg).  Risk Assessment: Allergies: Reviewed. She is allergic to amoxicillin, chlorhexidine gluconate, clindamycin, codeine, erythromycin, penicillin g, sulfa antibiotics, levofloxacin, shellfish  allergy, decadron [dexamethasone], mangifera indica, papaya derivatives, betadine [povidone iodine], clarithromycin, other, povidone-iodine, and prednisone.  Allergy Precautions: None required Coagulopathies: Reviewed. None identified.  Blood-thinner therapy: None at this time Active Infection(s): Reviewed. None identified. [name] is afebrile  Site Confirmation: [name] was asked to confirm the procedure and laterality before marking the site Procedure checklist: Completed Consent: Before the procedure and under the influence of no sedative(s), amnesic(s), or anxiolytics, the patient was informed of the treatment options, risks and possible complications. To fulfill our ethical and legal obligations, as recommended by the American Medical Association's Code of Ethics, I have informed the patient of my clinical impression; the nature and purpose of the treatment or procedure; the risks, benefits, and possible complications of the intervention; the alternatives, including doing nothing; the risk(s) and benefit(s) of the alternative treatment(s) or procedure(s); and the risk(s) and benefit(s) of doing nothing. The patient was provided information about the general risks and possible complications associated with the procedure. These may include, but are not limited to: failure to achieve desired goals, infection, bleeding, organ or nerve damage, allergic reactions, paralysis, and death. In addition, the patient was informed of those risks and complications associated to the procedure, such as failure to decrease pain; infection; bleeding; organ or nerve damage with subsequent damage to sensory, motor, and/or autonomic systems, resulting in permanent pain, numbness, and/or weakness of one or several areas of the body; allergic reactions; (i.e.: anaphylactic reaction); and/or death. Furthermore, the patient was informed of those risks and complications associated with the medications. These include, but  are not limited to: allergic reactions (i.e.: anaphylactic or anaphylactoid reaction(s)); adrenal axis suppression; blood sugar elevation that in diabetics may result in ketoacidosis or comma; water retention that in patients with history of congestive heart failure may result in shortness of breath, pulmonary edema, and decompensation with resultant heart failure; weight gain; swelling or edema; medication-induced neural toxicity; particulate matter embolism and blood vessel occlusion with resultant organ, and/or nervous system infarction; and/or aseptic necrosis of one or more joints. Finally, the patient was informed that Medicine is not an exact science; therefore, there is also the possibility of unforeseen or unpredictable risks and/or possible complications that may result in a catastrophic outcome. The patient indicated having understood very clearly. We have given the patient no guarantees and we  have made no promises. Enough time was given to the patient to ask questions, all of which were answered to the patient's satisfaction. [name] has indicated that she wanted to continue with the procedure. Attestation: I, the ordering provider, attest that I have discussed with the patient the benefits, risks, side-effects, alternatives, likelihood of achieving goals, and potential problems during recovery for the procedure that I have provided informed consent. Date  Time: 06/06/2022 10:08 AM  Pre-Procedure Preparation:  Monitoring: As per clinic protocol. Respiration, ETCO2, SpO2, BP, heart rate and rhythm monitor placed and checked for adequate function Safety Precautions: Patient was assessed for positional comfort and pressure points before starting the procedure. Time-out: I initiated and conducted the "Time-out" before starting the procedure, as per protocol. The patient was asked to participate by confirming the accuracy of the "Time Out" information. Verification of the correct person, site, and  procedure were performed and confirmed by me, the nursing staff, and the patient. "Time-out" conducted as per Joint Commission's Universal Protocol (UP.01.01.01). Time: [id number] Start Time: 1040 hrs.  Description/Narrative of Procedure:          Target: Suprascapular nerve as it passes thru the lower portion of the suprascapular notch. Location: Suprascapular notch Region: Shoulder, suprascapular Approach: Percutaneous   Rationale (medical necessity): procedure needed and proper for the diagnosis and/or treatment of the patient's medical symptoms and needs. Procedural Technique Safety Precautions: Aspiration looking for blood return was conducted prior to all injections. At no point did we inject any substances, as a needle was being advanced. No attempts were made at seeking any paresthesias. Safe injection practices and needle disposal techniques used. Medications properly checked for expiration dates. SDV (single dose vial) medications used. Description of the Procedure: Protocol guidelines were followed. The patient was placed in position over the procedure table. The target area was identified and the area prepped in the usual manner. The skin and muscle were infiltrated with local anesthetic. Appropriate amount of time allowed to pass for local anesthetics to take effect. Radiofrequency needles were introduced to the target area using fluoroscopic guidance. Using the NeuroTherm NT1100 Radiofrequency Generator, sensory stimulation using 50 Hz was used to locate & identify the nerve, making sure that the needle was positioned such that there was no sensory stimulation below 0.3 V or above 0.7 V. Stimulation using 2 Hz was used to evaluate the motor component. Care was taken not to lesion any nerves that demonstrated motor stimulation of the lower extremities at an output of less than 2.5 times that of the sensory threshold, or a maximum of 2.0 V. Once satisfactory placement of the needles was achieved,  the numbing solution was slowly injected after negative aspiration. After waiting for at least 2 minutes, the ablation was performed at 80 degrees C for 60 seconds, using regular Radiofrequency settings. Once the procedure was completed, the needles were then removed and the area cleansed, making sure to leave some of the prepping solution back to take advantage of its long term bactericidal properties. Intra-operative Compliance: Compliant   Vitals:   06/06/22 1055 06/06/22 1105 06/06/22 1116 06/06/22 1125  BP: 137/85 131/71 124/70 (!) 126/97  Pulse:      Resp: 16 16 18 16   Temp:      TempSrc:      SpO2: 100% 100% 100% 100%  Weight:      Height:        Start Time: 1040 hrs. End Time: 1055 hrs.  Materials & Medications:  Needle(s) Type:  Teflon-coated, curved tip, Radiofrequency needle(s) Gauge: 22G Length: 10cm Medication(s): Please see orders for medications and dosing details.  Imaging Guidance (Non-Spinal):          Type of Imaging Technique: Fluoroscopy Guidance (Non-Spinal) Indication(s): Assistance in needle guidance and placement for procedures requiring needle placement in or near specific anatomical locations not easily accessible without such assistance. Exposure Time: Please see nurses notes. Contrast: Before injecting any contrast, we confirmed that the patient did not have an allergy to iodine, shellfish, or radiological contrast. Once satisfactory needle placement was completed at the desired level, radiological contrast was injected. Contrast injected under live fluoroscopy. No contrast complications. See chart for type and volume of contrast used. Fluoroscopic Guidance: I was personally present during the use of fluoroscopy. "Tunnel Vision Technique" used to obtain the best possible view of the target area. Parallax error corrected before commencing the procedure. "Direction-depth-direction" technique used to introduce the needle under continuous pulsed fluoroscopy. Once  target was reached, antero-posterior, oblique, and lateral fluoroscopic projection used confirm needle placement in all planes. Images permanently stored in EMR. Interpretation: I personally interpreted the imaging intraoperatively. Adequate needle placement confirmed in multiple planes. Appropriate spread of contrast into desired area was observed. No evidence of afferent or efferent intravascular uptake. Permanent images saved into the patient's record.  Antibiotic Prophylaxis:   Anti-infectives (From admission, onward)    None      Indication(s): None identified  Post-operative Assessment:  Post-procedure Vital Signs:  Pulse/HCG Rate: 6561 Temp: (!) 97.2 F (36.2 C) Resp: 16 BP: (!) 126/97 SpO2: 100 %  EBL: None  Complications: No immediate post-treatment complications observed by team, or reported by patient.  Note: The patient tolerated the entire procedure well. A repeat set of vitals were taken after the procedure and the patient was kept under observation following institutional policy, for this type of procedure. Post-procedural neurological assessment was performed, showing return to baseline, prior to discharge. The patient was provided with post-procedure discharge instructions, including a section on how to identify potential problems. Should any problems arise concerning this procedure, the patient was given instructions to immediately contact us, at any time, without hesitation. In any case, we plan to contact the patient by telephone for a follow-up status report regarding this interventional procedure.  Comments:  No additional relevant information.  Plan of Care (POC)  Orders:  Orders Placed This Encounter  Procedures   Radiofrequency Shoulder Joint    For shoulder pain.    Scheduling Instructions:     Procedure: Suprascapular Nerve Radiofrequency Ablation     Laterality: Left-sided     Level(s): Suprascapular nerve     Sedation: With Sedation.      Scheduling Timeframe: Today    Order Specific Question:   Where will this procedure be performed?    Answer:   [facility]   DG [facility] C-ARM 1-60 MIN NO REPORT    Intraoperative interpretation by procedural physician at [place].    Standing Status:   Standing    Number of Occurrences:   1    Order Specific Question:   Reason for exam:    Answer:   Assistance in needle guidance and placement for procedures requiring needle placement in or near specific anatomical locations not easily accessible without such assistance.   Informed Consent Details: Physician/Practitioner Attestation; Transcribe to consent form and obtain patient signature    Note: Always confirm laterality of pain with [name], before procedure.    Order Specific  Question:   Physician/Practitioner attestation of informed consent for procedure/surgical case    Answer:   I, the physician/practitioner, attest that I have discussed with the patient the benefits, risks, side effects, alternatives, likelihood of achieving goals and potential problems during recovery for the procedure that I have provided informed consent.    Order Specific Question:   Procedure    Answer:   Suprascapular Nerva Radiofrequency Ablation    Order Specific Question:   Physician/Practitioner performing the procedure    Answer:   Lukasz Rogus A. Dossie Arbour, MD    Order Specific Question:   Indication/Reason    Answer:   Chronic shoulder pain (arthralgia)   Care order/instruction: Please confirm that the patient has stopped the Eliquis (Apixaban) x 3 days prior to procedure or surgery.    Please confirm that the patient has stopped the Eliquis (Apixaban) x 3 days prior to procedure or surgery.    Standing Status:   Standing    Number of Occurrences:   1   Provide equipment / supplies at bedside    Procedure tray: "Radiofrequency Tray" Additional material: Large hemostat (x1); Small hemostat (x1); Towels (x8); 4x4 sterile sponge pack  (x1) Needle type: Teflon-coated Radiofrequency Needle (Disposable  single use) Size: Regular Quantity: 1    Standing Status:   Standing    Number of Occurrences:   1    Order Specific Question:   Specify    Answer:   Radiofrequency Tray   Bleeding precautions    Standing Status:   Standing    Number of Occurrences:   1   Miscellanous precautions    Standing Status:   Standing    Number of Occurrences:   1   Miscellanous precautions    NOTE: Although It is true that patients can have allergies to shellfish and that shellfish contain iodine, most shellfish  allergies are due to two protein allergens present in the shellfish: tropomyosins and parvalbumin. Not all patients with shellfish allergies are allergic to iodine. However, as a precaution, avoid using iodine containing products.    Standing Status:   Standing    Number of Occurrences:   1   Skin care precautions    Patient indicates being allergic to a specific skin prepping solution. Please avoid.    Standing Status:   Standing    Number of Occurrences:   1   Chronic Opioid Analgesic:  Morphine IR 15 mg 1 tab p.o. every 6 hours (60 mg/day of morphine) MME/day: 60 mg/day.   Medications ordered for procedure: Meds ordered this encounter  Medications   lidocaine (XYLOCAINE) 2 % (with pres) injection 400 mg   pentafluoroprop-tetrafluoroeth (GEBAUERS) aerosol   lactated ringers infusion   midazolam (VERSED) 5 MG/5ML injection 0.5-2 mg    Make sure Flumazenil is available in the pyxis when using this medication. If oversedation occurs, administer 0.2 mg IV over 15 sec. If after 45 sec no response, administer 0.2 mg again over 1 min; may repeat at 1 min intervals; not to exceed 4 doses (1 mg)   fentaNYL (SUBLIMAZE) injection 25-50 mcg    Make sure Narcan is available in the pyxis when using this medication. In the event of respiratory depression (RR< 8/min): Titrate NARCAN (naloxone) in increments of 0.1 to 0.2 mg IV at 2-3 minute  intervals, until desired degree of reversal.   ropivacaine (PF) 2 mg/mL (0.2%) (NAROPIN) injection 4 mL   oxyCODONE-acetaminophen (PERCOCET) 5-325 MG tablet    Sig: Take 1 tablet by mouth  every 8 (eight) hours as needed for up to 7 days for severe pain. Must last 7 days.    Dispense:  21 tablet    Refill:  0    For acute post-operative pain. Not to be refilled.  Must last 7 days.   oxyCODONE-acetaminophen (PERCOCET) 5-325 MG tablet    Sig: Take 1 tablet by mouth every 8 (eight) hours as needed for up to 7 days for severe pain. Must last 7 days.    Dispense:  21 tablet    Refill:  0    For acute post-operative pain. Not to be refilled.  Must last 7 days.   Medications administered: We administered lactated ringers, midazolam, fentaNYL, and ropivacaine (PF) 2 mg/mL (0.2%).  See the medical record for exact dosing, route, and time of administration.  Follow-up plan:   Return in about 6 weeks (around 07/18/2022) for Proc-day (T,Th), (Face2F), (PPE).       Interventional Therapies  Risk Factors  Considerations:   ELIQUIS ANTICOAGULATION: (Stop: 3 days  Restart: 6 hours) ALLERGY: CONTRAST; shellfish; STEROIDS (dexamethasone, prednisone); Iodine HARDWARE: Not a candidate for RFA or SCS (cervical or lumbar). Patient not interested in IT pump (due to surgical risks). Concomitant BENZO therapy.  (At risk of respiratory depression) A-fib/flutter; unstable angina; CAD; (+) Cardiac cath; (+) cardioversion; mitral regurgitation Multiple allergies; OSA; GERD; IBS; OIC Depression; dissociative disorder; PTSD        Planned  Pending:   Therapeutic left suprascapular nerve RFA #3    Under consideration:   Therapeutic left suprascapular nerve RFA #3  Diagnostic right suprascapular NB #1 (without steroids)  Possible left cervical ESI #1 (With steroids, if suprascapular nerve RFA does not eliminate all of the pain in the left shoulder.)   Completed (No Steroids):   Diagnostic bilateral lumbar  facet MBB x2 (No Steroids)) (01/14/2019) (100/100/100/0)  Therapeutic bilateral suprascapular NB x2 (No Steroids) (06/19/2016) (80/80/0)  Therapeutic left suprascapular nerve RFA x2 (05/13/2020) (100/100/20/80) (shoulder blade pain from C5)    Therapeutic  Palliative (PRN) options:   Diagnostic bilateral lumbar facet MBB #2  Palliative bilateral suprascapular NB #3 (No Steroids) Palliative left suprascapular nerve RFA #2    Pharmacotherapy  Failed trial of membrane stabilizers (Neurontin/Lyrica).       Recent Visits Date Type Provider Dept  04/26/22 Office Visit Milinda Pointer, MD [facility]  03/15/22 Office Visit Milinda Pointer, MD [facility]  Showing recent visits within past 90 days and meeting all other requirements Today's Visits Date Type Provider Dept  06/06/22 Procedure visit Milinda Pointer, MD [facility]  Showing today's visits and meeting all other requirements Future Appointments Date Type Provider Dept  06/14/22 Appointment Milinda Pointer, MD [facility]  07/18/22 Appointment Milinda Pointer, MD [facility]  Showing future appointments within next 90 days and meeting all other requirements  Disposition: Discharge home  Discharge (Date  Time): 06/06/2022; 1130 hrs.   Primary Care Physician: Virginia Crews, MD Location: [facility] Note by: Gaspar Cola, MD (TTS technology used. I apologize for any typographical errors that were not detected and corrected.) Date: 06/06/2022; Time: 12:06 PM  Disclaimer:  Medicine is not an Chief Strategy Officer. The only guarantee in medicine is that nothing is guaranteed. It is important to note that the decision to proceed with this intervention was based on the information collected from the patient. The Data and conclusions were drawn from the patient's questionnaire, the interview, and the physical examination.  Because the  information was provided in large part by the patient, it cannot be guaranteed that it has not been purposely or unconsciously manipulated. Every effort has been made to obtain as much relevant data as possible for this evaluation. It is important to note that the conclusions that lead to this procedure are derived in large part from the available data. Always take into account that the treatment will also be dependent on availability of resources and existing treatment guidelines, considered by other Pain Management Practitioners as being common knowledge and practice, at the time of the intervention. For Medico-Legal purposes, it is also important to point out that variation in procedural techniques and pharmacological choices are the acceptable norm. The indications, contraindications, technique, and results of the above procedure should only be interpreted and judged by a Board-Certified Interventional Pain Specialist with extensive familiarity and expertise in the same exact procedure and technique.

## 2022-06-06 NOTE — Progress Notes (Signed)
Safety precautions to be maintained throughout the outpatient stay will include: orient to surroundings, keep bed in low position, maintain call bell within reach at all times, provide assistance with transfer out of bed and ambulation.  

## 2022-06-06 NOTE — Patient Instructions (Signed)
___________________________________________________________________________________________  Post-Radiofrequency (RF) Discharge Instructions  You have just completed a Radiofrequency Neurotomy.  The following instructions will provide you with information and guidelines for self-care upon discharge.  If at any time you have questions or concerns please call your physician. DO NOT DRIVE YOURSELF!!  Instructions: Apply ice: Fill a plastic sandwich bag with crushed ice. Cover it with a small towel and apply to injection site. Apply for 15 minutes then remove x 15 minutes. Repeat sequence on day of procedure, until you go to bed. The purpose is to minimize swelling and discomfort after procedure. Apply heat: Apply heat to procedure site starting the day following the procedure. The purpose is to treat any soreness and discomfort from the procedure. Food intake: No eating limitations, unless stipulated above.  Nevertheless, if you have had sedation, you may experience some nausea.  In this case, it may be wise to wait at least two hours prior to resuming regular diet. Physical activities: Keep activities to a minimum for the first 8 hours after the procedure. For the first 24 hours after the procedure, do not drive a motor vehicle,  Operate heavy machinery, power tools, or handle any weapons.  Consider walking with the use of an assistive device or accompanied by an adult for the first 24 hours.  Do not drink alcoholic beverages including beer.  Do not make any important decisions or sign any legal documents. Go home and rest today.  Resume activities tomorrow, as tolerated.  Use caution in moving about as you may experience mild leg weakness.  Use caution in cooking, use of household electrical appliances and climbing steps. Driving: If you have received any sedation, you are not allowed to drive for 24 hours after your procedure. Blood thinner: Restart your blood thinner 6 hours after your procedure. (Only  for those taking blood thinners) Insulin: As soon as you can eat, you may resume your normal dosing schedule. (Only for those taking insulin) Medications: May resume pre-procedure medications.  Do not take any drugs, other than what has been prescribed to you. Infection prevention: Keep procedure site clean and dry. Post-procedure Pain Diary: Extremely important that this be done correctly and accurately. Recorded information will be used to determine the next step in treatment. Pain evaluated is that of treated area only. Do not include pain from an untreated area. Complete every hour, on the hour, for the initial 8 hours. Set an alarm to help you do this part accurately. Do not go to sleep and have it completed later. It will not be accurate. Follow-up appointment: Keep your follow-up appointment after the procedure. Usually 2-6 weeks after radiofrequency. Bring you pain diary. The information collected will be essential for your long-term care.   Expect: From numbing medicine (AKA: Local Anesthetics): Numbness or decrease in pain. Onset: Full effect within 15 minutes of injected. Duration: It will depend on the type of local anesthetic used. On the average, 1 to 8 hours.  From steroids (when added): Decrease in swelling or inflammation. Once inflammation is improved, relief of the pain will follow. Onset of benefits: Depends on the amount of swelling present. The more swelling, the longer it will take for the benefits to be seen. In some cases, up to 10 days. Duration: Steroids will stay in the system x 2 weeks. Duration of benefits will depend on multiple posibilities including persistent irritating factors. From procedure: Some discomfort is to be expected once the numbing medicine wears off. In the case of radiofrequency procedures,   this may last as long as 6 weeks. Additional post-procedure pain medication is provided for this. Discomfort is minimized if ice and heat are applied as  instructed.  Call if: You experience numbness and weakness that gets worse with time, as opposed to wearing off. He experience any unusual bleeding, difficulty breathing, or loss of the ability to control your bowel and bladder. (This applies to Spinal procedures only) You experience any redness, swelling, heat, red streaks, elevated temperature, fever, or any other signs of a possible infection.  Emergency Numbers: Durning business hours (Monday - Thursday, 8:00 AM - 4:00 PM) (Friday, 9:00 AM - 12:00 Noon): [phone] After hours: [phone] ____________________________________________________________________________________________  ____________________________________________________________________________________________  Blood Thinners  IMPORTANT NOTICE:  If you take any of these, make sure to notify the nursing staff.  Failure to do so may result in injury.  Recommended time intervals to stop and restart blood-thinners, before & after invasive procedures  Generic Name Brand Name Pre-procedure. Stop this long before procedure. Post-procedure. Minimum waiting period before restarting.  Abciximab Reopro 15 days 2 hrs  Alteplase Activase 10 days 10 days  Anagrelide Agrylin    Apixaban Eliquis 3 days 6 hrs  Cilostazol Pletal 3 days 5 hrs  Clopidogrel Plavix 7-10 days 2 hrs  Dabigatran Pradaxa 5 days 6 hrs  Dalteparin Fragmin 24 hours 4 hrs  Dipyridamole Aggrenox 11days 2 hrs  Edoxaban Lixiana; Savaysa 3 days 2 hrs  Enoxaparin  Lovenox 24 hours 4 hrs  Eptifibatide Integrillin 8 hours 2 hrs  Fondaparinux  Arixtra 72 hours 12 hrs  Hydroxychloroquine Plaquenil 11 days   Prasugrel Effient 7-10 days 6 hrs  Reteplase Retavase 10 days 10 days  Rivaroxaban Xarelto 3 days 6 hrs  Ticagrelor Brilinta 5-7 days 6 hrs  Ticlopidine Ticlid 10-14 days 2 hrs  Tinzaparin Innohep 24 hours 4 hrs  Tirofiban Aggrastat 8 hours 2 hrs  Warfarin Coumadin 5 days 2 hrs   Other medications  with blood-thinning effects  Product indications Generic (Brand) names Note  Cholesterol Lipitor Stop 4 days before procedure  Blood thinner (injectable) Heparin (LMW or LMWH Heparin) Stop 24 hours before procedure  Cancer Ibrutinib (Imbruvica) Stop 7 days before procedure  Malaria/Rheumatoid Hydroxychloroquine (Plaquenil) Stop 11 days before procedure  Thrombolytics  10 days before or after procedures   Over-the-counter (OTC) Products with blood-thinning effects  Product Common names Stop Time  Aspirin > 325 mg Goody Powders, Excedrin, etc. 11 days  Aspirin ? 81 mg  7 days  Fish oil  4 days  Garlic supplements  7 days  Ginkgo biloba  36 hours  Ginseng  24 hours  NSAIDs Ibuprofen, Naprosyn, etc. 3 days  Vitamin E  4 days   ____________________________________________________________________________________________  

## 2022-06-07 ENCOUNTER — Telehealth: Payer: Self-pay

## 2022-06-07 ENCOUNTER — Telehealth: Payer: Self-pay | Admitting: *Deleted

## 2022-06-07 NOTE — Telephone Encounter (Signed)
No problems post procedure. 

## 2022-06-07 NOTE — Telephone Encounter (Signed)
Copied from [place] [id number]. Topic: General - Other >> Jun 07, 2022 10:37 AM [name] wrote: Reason for CRM: The patient has called to request handicap license plates rather than a placard  The patient would like to speak with a member of staff about their concern further when possible   The patient has been told that they need a completed [id number] form as well as their medical records for the DM   Please contact when available

## 2022-06-09 ENCOUNTER — Telehealth: Payer: Self-pay | Admitting: Cardiology

## 2022-06-09 NOTE — Telephone Encounter (Signed)
LMOV to clarify

## 2022-06-09 NOTE — Telephone Encounter (Signed)
Patient request form for placard to be filled for now. Will talk to provider at her next office visit.

## 2022-06-09 NOTE — Telephone Encounter (Signed)
-----   Message from Britt Bottom, [place] sent at 06/06/2022 10:05 AM EDT ----- Pt has f/u with Garen Lah on 4/3 at 11:40. Pt has been going to [facility] and hasn't seen Agbor Etang since 04/2020. Please advise if pt is switching over from [facility] or would like to cancel appointment.  Thank you, Lenda Kelp

## 2022-06-09 NOTE — Telephone Encounter (Signed)
That is the form for the parking placard. I can fill that out for her. She will need to talk to the [facility] about how to get a handicap plate. Does she want the placard form completed?

## 2022-06-12 NOTE — Telephone Encounter (Signed)
Called patient to let her know that form is ready for pick up.  She said that she really wants the plate and has the form and will be bringing it by and will also take the one for the placard in case she can't get the plate.

## 2022-06-12 NOTE — Progress Notes (Unsigned)
PROVIDER NOTE: Information contained herein reflects review and annotations entered in association with encounter. Interpretation of such information and data should be left to medically-trained personnel. Information provided to patient can be located elsewhere in the medical record under "Patient Instructions". Document created using STT-dictation technology, any transcriptional errors that may result from process are unintentional.    Patient: [name]  Service Category: E/M  Provider: Gaspar Cola, MD  DOB: January 24, 1961  DOS: 06/14/2022  Referring Provider: Virginia Crews, MD  MRN: [id number]  Specialty: Interventional Pain Management  PCP: Virginia Crews, MD  Type: Established Patient  Setting: Ambulatory outpatient    Location: Office  Delivery: Face-to-face     HPI  [name], a 62 y.o. year old female, is here today because of her No primary diagnosis found.. Ms. Yardley primary complain today is No chief complaint on file.  Pertinent problems: Ms. 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); Chronic hip pain (2ry area of Pain) (Bilateral) (L>R); Chronic shoulder arthropathy (Left); Trigger point of shoulder region (Left); Enthesopathy of hip region (Left); Groin pain, chronic,  left; Other intervertebral disc degeneration, lumbar region; History of lumbar surgery; DDD (degenerative disc disease), lumbosacral; Neuropathic pain; Neurogenic pain; Spondylosis without myelopathy or radiculopathy, lumbosacral region; Chronic musculoskeletal pain; Cervical radiculitis (C4,C5,C8) (Left); DDD (degenerative disc disease), cervical; Foraminal stenosis of cervical region (C3-4) (Right); 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); Abnormal MRI, shoulder (Left: 03/20/2017) (Right: 12/11/2020); Chronic neck pain with history of cervical spinal surgery; Enthesopathy of shoulder (Left); Osteoarthritis of shoulder (Left); Symptoms referable to shoulder joint; Tendinopathy of rotator cuff (Left); Sprain of supraspinatus muscle or tendon, sequela (Left); Subdeltoid bursitis of shoulder (Left); Subacromial bursitis of shoulder (Left); Chronic shoulder pain (Left); Abnormal MRI, cervical spine (05/12/2020); Chronic upper back pain; Chronic shoulder pain (Right); Osteoarthritis of shoulder (Right); Arthropathy of shoulder (Right); C7 radiculopathy (Right); Cervical radiculopathy at C8 (Right); Muscle weakness of upper extremity (Right); Weakness of upper extremity (Right); Cervical fusion syndrome; Failed back syndrome of cervical spine; Cervicalgia; and Chronic hip pain after total replacement (THR) (Right) on their pertinent problem list. Pain Assessment: Severity of   is reported as a  /10. Location:    / . Onset:  . Quality:  . Timing:  . Modifying factor(s):  Marland Kitchen Vitals:  vitals were not taken for this visit.  BMI: Estimated body mass index is 28.7 kg/m as calculated from the following:   Height as of 06/06/22: 5\' 3"  (1.6 m).   Weight as of 06/06/22: 162 lb (73.5 kg). Last encounter: 04/26/2022. Last procedure: 06/06/2022.  Reason for encounter:  *** . ***  Pharmacotherapy Assessment  Analgesic: Morphine IR 15 mg 1 tab p.o. every 6 hours (60 mg/day of morphine) MME/day: 60 mg/day.    Monitoring:  PMP: PDMP reviewed during this encounter.       Pharmacotherapy: No side-effects or adverse reactions reported. Compliance: No problems identified. Effectiveness: Clinically acceptable.  No  notes on file  No results found for: "CBDTHCR" No results found for: "D8THCCBX" No results found for: "D9THCCBX"  UDS:  Summary  Date Value Ref Range Status  09/12/2021 Note  Final    Comment:    ==================================================================== [employer] (MW) ==================================================================== Test                             Result       Flag       Units  Drug Present and Declared for Prescription Verification   Alprazolam                     434          EXPECTED   ng/mg creat   Alpha-hydroxyalprazolam        264          EXPECTED   ng/mg creat    Source of alprazolam is a scheduled prescription medication. Alpha-    hydroxyalprazolam is an expected metabolite of alprazolam.    Morphine                       >11111       EXPECTED   ng/mg creat   Normorphine                    951          EXPECTED   ng/mg creat    Potential sources of large amounts of morphine in the absence of    codeine include administration of morphine or use of heroin.     Normorphine is an expected metabolite of morphine.    Hydromorphone                  407          EXPECTED   ng/mg creat    Hydromorphone may be present as a metabolite of morphine;    concentrations of hydromorphone rarely exceed 5% of the morphine    concentration when this is the source of hydromorphone.  ==================================================================== Test                      Result    Flag   Units      Ref Range   Creatinine              90               mg/dL      >=20 ==================================================================== Declared Medications:  The flagging and interpretation on this report are based on the  following  declared medications.  Unexpected results may arise from  inaccuracies in the declared medications.   **Note: The testing scope of this panel includes these medications:   Alprazolam (Xanax)  Morphine (MSIR)   **Note: The testing scope of this panel does not include the  following reported medications:   Amiodarone (Pacerone)  Apixaban (Eliquis)  Buspirone (Buspar)  Clonidine  Diltiazem  Eye Drops  Linaclotide (Linzess)  Menthol  Montelukast (Singulair)  Omeprazole (Prilosec)  Ondansetron (Zofran)  Pancrelipase (Creon)  Polyethylene Glycol (MiraLAX)  Sodium Chloride  Valacyclovir (Valtrex) ==================================================================== For clinical consultation, please call [phone]. ====================================================================       ROS  Constitutional: Denies any fever or chills Gastrointestinal: No reported hemesis, hematochezia, vomiting, or acute GI distress Musculoskeletal: Denies any acute onset joint swelling, redness, loss of ROM,  or weakness Neurological: No reported episodes of acute onset apraxia, aphasia, dysarthria, agnosia, amnesia, paralysis, loss of coordination, or loss of consciousness  Medication Review  ALPRAZolam, acetaminophen, apixaban, busPIRone, dexlansoprazole, diltiazem, linaclotide, montelukast, morphine, naloxone, oxyCODONE-acetaminophen, and valACYclovir  History Review  Allergy: Ms. Indovina is allergic to amoxicillin, chlorhexidine gluconate, clindamycin, codeine, erythromycin, penicillin g, sulfa antibiotics, levofloxacin, shellfish allergy, decadron [dexamethasone], mangifera indica, papaya derivatives, betadine [povidone iodine], clarithromycin, other, povidone-iodine, and prednisone. Drug: Ms. Reckers  reports current drug use. Alcohol:  reports no history of alcohol use. Tobacco:  reports that she has never smoked. She has never used smokeless tobacco. Social: Ms. Walton  reports that  she has never smoked. She has never used smokeless tobacco. She reports current drug use. She reports that she does not drink alcohol. 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), Anemia, Anxiety, Asthma, Cephalalgia (08/18/2014), Cervical disc disease (08/18/2014), Chronic headaches, Chronic, continuous use of opioids, DDD (degenerative disc disease), Depression, Dissociative disorder, Dizziness (04/22/2013), Duodenal ulcer with hemorrhage and perforation ([place]) (04/27/2003), Foot drop, right, GERD (gastroesophageal reflux disease), H/O arthrodesis (08/18/2014), Headache(784.0), History of blood transfusion, History of cardiac cath, History of cardioversion, History of cervical spinal surgery (01/04/2015), History of kidney stones, Hypertension, Inverted T wave, Iron deficiency (02/01/2021), Mitral regurgitation, Narrowing of intervertebral disc space (08/18/2014), Orthostatic hypotension (04/22/2013), Pain, Paroxysmal atrial fibrillation ([place]) (2021), Pneumonia, PONV (postoperative nausea and vomiting), Postop Hyponatremia (05/14/2012), Postoperative anemia due to acute blood loss (05/14/2012), PTSD (post-traumatic stress disorder), Right foot drop, Right hip arthralgia (08/18/2014), Sleep apnea (2021), Therapeutic opioid-induced constipation (OIC), and Typical atrial flutter ([place]). Surgical: Ms. Schwaller  has a past surgical history that includes RIGHT HIP ARTHROSCOPY FOR LABRAL TEAR (ABOUT 2010); Anterior fusion cervical spine (MAY 2012); Nasal septum surgery (MARCH 2013); Back surgery (2009); Tonsillectomy; Abdominal hysterectomy; DIAGNOSTIC LAPAROSCOPIES - MULTIPLE FOR ENDOMETRIOSIS; Cholecystectomy; Hip arthroscopy (09/20/2011); Shoulder arthroscopy (05-06-12); Carpal tunnel release (05-06-12); Total hip arthroplasty (Right, 05/13/2012); Anterior cervical decomp/discectomy fusion (N/A, 10/30/2012);  Posterior cervical fusion/foraminotomy (N/A, 04/16/2013); Breast reduction surgery (Bilateral, 06/2016); Colonoscopy with propofol (N/A, 01/26/2017); Esophagogastroduodenoscopy (egd) with propofol (N/A, 01/26/2017); Esophageal dilation (N/A, 01/26/2017); polypectomy (N/A, 01/26/2017); PH impedance study (N/A, 05/30/2017); Esophageal manometry (N/A, 05/30/2017); Radiofrequency ablation nerves; afib (05/2019); LEFT HEART CATH AND CORONARY ANGIOGRAPHY (Left, 06/10/2019); Cardioversion (N/A, 12/03/2019); Esophagogastroduodenoscopy (egd) with propofol (N/A, 01/30/2020); Reduction mammaplasty (Bilateral, 05/2016); Breast biopsy (Right, 2008); Breast excisional biopsy (Left, 1998); TEE without cardioversion (N/A, 08/05/2020); ATRIAL FIBRILLATION ABLATION (N/A, 08/06/2020); Cardiac electrophysiology mapping and ablation; Esophagogastroduodenoscopy (egd) with propofol (N/A, 01/15/2021); Colonoscopy with propofol (N/A, 01/16/2021); Cardioversion (N/A, 08/01/2021); Cardioversion (N/A, 08/05/2021); Esophagogastroduodenoscopy (egd) with propofol (N/A, 12/07/2021); and Lower Extremity Angiography (Left, 01/06/2022). Family: family history includes Aneurysm in her maternal grandmother and mother; Anxiety disorder in her grandchild; Bipolar disorder in her grandchild and sister; Breast cancer (age of onset: 54) in her paternal grandmother; Depression in her grandchild.  Laboratory Chemistry Profile   Renal Lab Results  Component Value Date   BUN 10 05/05/2022   CREATININE 0.95 05/05/2022   BCR 9 (L) 01/19/2021   GFRAA >60 11/26/2019   GFRNONAA >60 05/05/2022    Hepatic Lab Results  Component Value Date   AST 12 (L) 01/04/2022   ALT 10 01/04/2022   ALBUMIN 3.3 (L) 01/04/2022   ALKPHOS 87 01/04/2022   LIPASE 26 08/08/2021    Electrolytes Lab Results  Component Value Date   NA 137 05/05/2022   K 3.7  05/05/2022   CL 102 05/05/2022   CALCIUM 9.2 05/05/2022   MG 2.3 08/08/2021    Bone Lab Results  Component Value Date    25OHVITD1 23 (L) 11/11/2018   25OHVITD2 9.8 11/11/2018   25OHVITD3 13 11/11/2018    Inflammation (CRP: Acute Phase) (ESR: Chronic Phase) Lab Results  Component Value Date   CRP 12 (H) 11/11/2018   ESRSEDRATE 42 (H) 11/11/2018   LATICACIDVEN 1.0 01/14/2021         Note: Above Lab results reviewed.  Recent Imaging Review  DG PAIN CLINIC C-ARM 1-60 MIN NO REPORT Fluoro was used, but no Radiologist interpretation will be provided.  Please refer to "NOTES" tab for provider progress note. Note: Reviewed        Physical Exam  General appearance: Well nourished, well developed, and well hydrated. In no apparent acute distress Mental status: Alert, oriented x 3 (person, place, & time)       Respiratory: No evidence of acute respiratory distress Eyes: PERLA Vitals: There were no vitals taken for this visit. BMI: Estimated body mass index is 28.7 kg/m as calculated from the following:   Height as of 06/06/22: 5\' 3"  (1.6 m).   Weight as of 06/06/22: 162 lb (73.5 kg). Ideal: Ideal body weight: 52.4 kg (115 lb 8.3 oz) Adjusted ideal body weight: 60.8 kg (134 lb 1.8 oz)  Assessment   Diagnosis Status  No diagnosis found. Controlled Controlled Controlled   Updated Problems: No problems updated.  Plan of Care  Problem-specific:  No problem-specific Assessment & Plan notes found for this encounter.  Ms. CHERREE GURKA has a current medication list which includes the following long-term medication(s): apixaban, dexlansoprazole, diltiazem, linzess, montelukast, morphine, morphine, and morphine.  Pharmacotherapy (Medications Ordered): No orders of the defined types were placed in this encounter.  Orders:  No orders of the defined types were placed in this encounter.  Follow-up plan:   No follow-ups on file.      Interventional Therapies  Risk Factors  Considerations:   ELIQUIS ANTICOAGULATION: (Stop: 3 days  Restart: 6 hours) ALLERGY: CONTRAST; shellfish; STEROIDS  (dexamethasone, prednisone); Iodine HARDWARE: Not a candidate for RFA or SCS (cervical or lumbar). Patient not interested in IT pump (due to surgical risks). Concomitant BENZO therapy.  (At risk of respiratory depression) A-fib/flutter; unstable angina; CAD; (+) Cardiac cath; (+) cardioversion; mitral regurgitation Multiple allergies; OSA; GERD; IBS; OIC Depression; dissociative disorder; PTSD        Planned  Pending:   Therapeutic left suprascapular nerve RFA #3    Under consideration:   Therapeutic left suprascapular nerve RFA #3  Diagnostic right suprascapular NB #1 (without steroids)  Possible left cervical ESI #1 (With steroids, if suprascapular nerve RFA does not eliminate all of the pain in the left shoulder.)   Completed (No Steroids):   Diagnostic bilateral lumbar facet MBB x2 (No Steroids)) (01/14/2019) (100/100/100/0)  Therapeutic bilateral suprascapular NB x2 (No Steroids) (06/19/2016) (80/80/0)  Therapeutic left suprascapular nerve RFA x2 (05/13/2020) (100/100/20/80) (shoulder blade pain from C5)    Therapeutic  Palliative (PRN) options:   Diagnostic bilateral lumbar facet MBB #2  Palliative bilateral suprascapular NB #3 (No Steroids) Palliative left suprascapular nerve RFA #2    Pharmacotherapy  Failed trial of membrane stabilizers (Neurontin/Lyrica).        Recent Visits Date Type Provider Dept  06/06/22 Procedure visit Milinda Pointer, MD [facility]  04/26/22 Office Visit Milinda Pointer, [facility]  03/15/22 Office Visit Milinda Pointer,  MD [facility]  Showing recent visits within past 90 days and meeting all other requirements Future Appointments Date Type Provider Dept  06/14/22 Appointment Milinda Pointer, [facility]  07/18/22 Appointment Milinda Pointer, MD [facility]  Showing future appointments within next 90 days and meeting all other requirements  I discussed the  assessment and treatment plan with the patient. The patient was provided an opportunity to ask questions and all were answered. The patient agreed with the plan and demonstrated an understanding of the instructions.  Patient advised to call back or seek an in-person evaluation if the symptoms or condition worsens.  Duration of encounter: *** minutes.  Total time on encounter, as per AMA guidelines included both the face-to-face and non-face-to-face time personally spent by the physician and/or other qualified health care professional(s) on the day of the encounter (includes time in activities that require the physician or other qualified health care professional and does not include time in activities normally performed by clinical staff). Physician's time may include the following activities when performed: Preparing to see the patient (e.g., pre-charting review of records, searching for previously ordered imaging, lab work, and nerve conduction tests) Review of prior analgesic pharmacotherapies. Reviewing PMP Interpreting ordered tests (e.g., lab work, imaging, nerve conduction tests) Performing post-procedure evaluations, including interpretation of diagnostic procedures Obtaining and/or reviewing separately obtained history Performing a medically appropriate examination and/or evaluation Counseling and educating the patient/family/caregiver Ordering medications, tests, or procedures Referring and communicating with other health care professionals (when not separately reported) Documenting clinical information in the electronic or other health record Independently interpreting results (not separately reported) and communicating results to the patient/ family/caregiver Care coordination (not separately reported)  Note by: Gaspar Cola, MD Date: 06/14/2022; Time: 12:44 PM

## 2022-06-12 NOTE — Telephone Encounter (Signed)
Form completed. Will leave at [address] front desk. Please let patient know she can come and pick it up at her convenience.

## 2022-06-13 ENCOUNTER — Ambulatory Visit (INDEPENDENT_AMBULATORY_CARE_PROVIDER_SITE_OTHER): Payer: Medicare Other | Admitting: Physician Assistant

## 2022-06-13 DIAGNOSIS — Z91199 Patient's noncompliance with other medical treatment and regimen due to unspecified reason: Secondary | ICD-10-CM

## 2022-06-13 NOTE — Progress Notes (Signed)
No show for appt. 

## 2022-06-13 NOTE — Telephone Encounter (Signed)
Spoke with patient Patient only saw [facility] because they needed to do her ablation before Dr Quentin Ore could and they followed up with her the one time She is not changing her care to [facility] and would like to keep appointment with Dr Garen Lah

## 2022-06-14 ENCOUNTER — Ambulatory Visit: Payer: Medicare Other | Attending: Cardiology | Admitting: Cardiology

## 2022-06-14 ENCOUNTER — Encounter: Payer: Self-pay | Admitting: Pain Medicine

## 2022-06-14 ENCOUNTER — Ambulatory Visit
Admission: RE | Admit: 2022-06-14 | Discharge: 2022-06-14 | Disposition: A | Discharge status: Home / Self Care | Payer: Medicare Other | Attending: Pain Medicine | Admitting: Pain Medicine

## 2022-06-14 ENCOUNTER — Ambulatory Visit (HOSPITAL_BASED_OUTPATIENT_CLINIC_OR_DEPARTMENT_OTHER): Payer: Medicare Other | Admitting: Pain Medicine

## 2022-06-14 ENCOUNTER — Ambulatory Visit
Admission: RE | Admit: 2022-06-14 | Discharge: 2022-06-14 | Disposition: A | Discharge status: Home / Self Care | Payer: Medicare Other | Source: Ambulatory Visit | Attending: Pain Medicine | Admitting: Pain Medicine

## 2022-06-14 VITALS — BP 152/93 | HR 72 | Temp 97.4°F | Resp 16 | Ht 63.0 in | Wt 160.0 lb

## 2022-06-14 DIAGNOSIS — Z09 Encounter for follow-up examination after completed treatment for conditions other than malignant neoplasm: Secondary | ICD-10-CM | POA: Insufficient documentation

## 2022-06-14 DIAGNOSIS — M7918 Myalgia, other site: Secondary | ICD-10-CM | POA: Insufficient documentation

## 2022-06-14 DIAGNOSIS — M25551 Pain in right hip: Secondary | ICD-10-CM | POA: Insufficient documentation

## 2022-06-14 DIAGNOSIS — M25552 Pain in left hip: Secondary | ICD-10-CM | POA: Insufficient documentation

## 2022-06-14 DIAGNOSIS — Z7901 Long term (current) use of anticoagulants: Secondary | ICD-10-CM | POA: Insufficient documentation

## 2022-06-14 DIAGNOSIS — W19XXXA Unspecified fall, initial encounter: Secondary | ICD-10-CM | POA: Insufficient documentation

## 2022-06-14 DIAGNOSIS — Z79891 Long term (current) use of opiate analgesic: Secondary | ICD-10-CM | POA: Insufficient documentation

## 2022-06-14 DIAGNOSIS — Z79899 Other long term (current) drug therapy: Secondary | ICD-10-CM | POA: Insufficient documentation

## 2022-06-14 DIAGNOSIS — M25512 Pain in left shoulder: Secondary | ICD-10-CM | POA: Insufficient documentation

## 2022-06-14 DIAGNOSIS — G8929 Other chronic pain: Secondary | ICD-10-CM | POA: Diagnosis not present

## 2022-06-14 DIAGNOSIS — M542 Cervicalgia: Secondary | ICD-10-CM | POA: Insufficient documentation

## 2022-06-14 DIAGNOSIS — G4486 Cervicogenic headache: Secondary | ICD-10-CM | POA: Insufficient documentation

## 2022-06-14 DIAGNOSIS — M5136 Other intervertebral disc degeneration, lumbar region: Secondary | ICD-10-CM | POA: Diagnosis not present

## 2022-06-14 DIAGNOSIS — M47816 Spondylosis without myelopathy or radiculopathy, lumbar region: Secondary | ICD-10-CM | POA: Insufficient documentation

## 2022-06-14 DIAGNOSIS — M961 Postlaminectomy syndrome, not elsewhere classified: Secondary | ICD-10-CM | POA: Insufficient documentation

## 2022-06-14 DIAGNOSIS — M549 Dorsalgia, unspecified: Secondary | ICD-10-CM | POA: Insufficient documentation

## 2022-06-14 DIAGNOSIS — G894 Chronic pain syndrome: Secondary | ICD-10-CM | POA: Insufficient documentation

## 2022-06-14 DIAGNOSIS — Z9889 Other specified postprocedural states: Secondary | ICD-10-CM | POA: Insufficient documentation

## 2022-06-14 DIAGNOSIS — M545 Low back pain, unspecified: Secondary | ICD-10-CM

## 2022-06-14 DIAGNOSIS — M47817 Spondylosis without myelopathy or radiculopathy, lumbosacral region: Secondary | ICD-10-CM | POA: Diagnosis not present

## 2022-06-14 DIAGNOSIS — M25511 Pain in right shoulder: Secondary | ICD-10-CM | POA: Insufficient documentation

## 2022-06-14 MED ORDER — MORPHINE SULFATE 15 MG PO TABS: 15.0000 mg | ORAL_TABLET | Freq: Four times a day (QID) | ORAL | 0 refills | Status: DC | PRN

## 2022-06-14 MED ORDER — MORPHINE SULFATE 15 MG PO TABS
15.0000 mg | ORAL_TABLET | Freq: Four times a day (QID) | ORAL | 0 refills | Status: DC | PRN
Start: 2022-08-20 — End: 2022-08-30

## 2022-06-14 NOTE — Progress Notes (Signed)
Nursing Pain Medication Assessment:  Safety precautions to be maintained throughout the outpatient stay will include: orient to surroundings, keep bed in low position, maintain call bell within reach at all times, provide assistance with transfer out of bed and ambulation.  Medication Inspection Compliance: Pill count conducted under aseptic conditions, in front of the patient. Neither the pills nor the bottle was removed from the patient's sight at any time. Once count was completed pills were immediately returned to the patient in their original bottle.  Medication: Morphine IR Pill/Patch Count:  40 of 120 pills remain Pill/Patch Appearance: Markings consistent with prescribed medication Bottle Appearance: Standard pharmacy container. Clearly labeled. Filled Date: 03 / 11 / 2024 Last Medication intake:  Today

## 2022-06-14 NOTE — Progress Notes (Signed)
Called [facility] to cancel any unfilled prescriptions for opioids. Pharmacist informed me that there are no unfilled prescriptions for opioids. Dr. Dossie Arbour notified.

## 2022-06-15 ENCOUNTER — Encounter: Payer: Self-pay | Admitting: Cardiology

## 2022-06-19 ENCOUNTER — Ambulatory Visit (INDEPENDENT_AMBULATORY_CARE_PROVIDER_SITE_OTHER): Payer: Medicare Other | Admitting: Family Medicine

## 2022-06-19 ENCOUNTER — Encounter: Payer: Self-pay | Admitting: Family Medicine

## 2022-06-19 VITALS — BP 122/80 | HR 78 | Temp 98.0°F | Resp 16 | Wt 156.6 lb

## 2022-06-19 DIAGNOSIS — N941 Unspecified dyspareunia: Secondary | ICD-10-CM

## 2022-06-19 DIAGNOSIS — N952 Postmenopausal atrophic vaginitis: Secondary | ICD-10-CM | POA: Diagnosis not present

## 2022-06-19 DIAGNOSIS — N393 Stress incontinence (female) (male): Secondary | ICD-10-CM

## 2022-06-19 MED ORDER — ESTRADIOL 0.1 MG/GM VA CREA
1.0000 | TOPICAL_CREAM | VAGINAL | 12 refills | Status: AC
Start: 2022-06-19 — End: ?

## 2022-06-19 NOTE — Progress Notes (Signed)
I,Sulibeya S Dimas,acting as a scribe for Shirlee Latch, MD.,have documented all relevant documentation on the behalf of Shirlee Latch, MD,as directed by  Shirlee Latch, MD while in the presence of Shirlee Latch, MD.     Established patient visit   Patient: [name]   DOB: Mar 22, 1960   62 y.o. Female  MRN: [id number] Visit Date: 06/19/2022  Today's healthcare provider: Shirlee Latch, MD   Chief Complaint  Patient presents with   Vaginal Pain   Subjective    HPI  Patient C/O painful intercourse x 4 weeks. She denies any bleeding, rash, vaginal discharge, or painful urination. She reports going more frequent to urinate. She reports her husband told her he feels like he is "hitting something."  Medications: Outpatient Medications Prior to Visit  Medication Sig   acetaminophen (TYLENOL) 325 MG tablet Take 2 tablets (650 mg total) by mouth every 6 (six) hours as needed for mild pain (or Fever >/= 101).   ALPRAZolam (XANAX) 1 MG tablet Take 1 mg by mouth at bedtime.    apixaban (ELIQUIS) 5 MG TABS tablet Take 1 tablet (5 mg total) by mouth 2 (two) times daily.   dexlansoprazole (DEXILANT) 60 MG capsule TAKE 1 CAPSULE BY MOUTH EVERY DAY   diltiazem (CARDIZEM CD) 120 MG 24 hr capsule Take 1 capsule (120 mg total) by mouth daily. (Patient taking differently: Take 240 mg by mouth daily.)   linaclotide (LINZESS) 290 MCG CAPS capsule TAKE 1 CAPSULE BY MOUTH DAILY BEFORE BREAKFAST.   montelukast (SINGULAIR) 10 MG tablet TAKE 1 TABLET BY MOUTH EVERY DAY   morphine (MSIR) 15 MG tablet Take 1 tablet (15 mg total) by mouth every 6 (six) hours as needed for moderate pain or severe pain. Must last 30 days.   [START ON 06/21/2022] morphine (MSIR) 15 MG tablet Take 1 tablet (15 mg total) by mouth every 6 (six) hours as needed for moderate pain or severe pain. Must last 30 days.   [START ON 07/21/2022] morphine (MSIR) 15 MG tablet Take 1 tablet (15 mg total) by mouth every 6  (six) hours as needed for moderate pain or severe pain. Must last 30 days.   [START ON 08/20/2022] morphine (MSIR) 15 MG tablet Take 1 tablet (15 mg total) by mouth every 6 (six) hours as needed for moderate pain or severe pain. Must last 30 days.   naloxone (NARCAN) nasal spray 4 mg/0.1 mL Place 1 spray into the nose as needed for up to 365 doses (for opioid-induced respiratory depresssion). In case of emergency (overdose), spray once into each nostril. If no response within 3 minutes, repeat application and call 911.   oxyCODONE-acetaminophen (PERCOCET) 5-325 MG tablet Take 1 tablet by mouth every 8 (eight) hours as needed for up to 7 days for severe pain. Must last 7 days.   valACYclovir (VALTREX) 1000 MG tablet TAKE TWO TABLETS BY MOUTH TWICE DAILY FOR ONE DAY FEVER BLISTER   busPIRone (BUSPAR) 15 MG tablet Take 15 mg by mouth 2 (two) times daily. (Patient not taking: Reported on 06/19/2022)   No facility-administered medications prior to visit.    Review of Systems  Constitutional:  Negative for chills and fever.  Genitourinary:  Positive for frequency and vaginal pain. Negative for dysuria, genital sores, hematuria, vaginal bleeding and vaginal discharge.       Objective    BP 122/80 (BP Location: Left Arm, Patient Position: Sitting, Cuff Size: Large)   Pulse 78   Temp 98 F (36.7 C) (  Temporal)   Resp 16   Wt 156 lb 9.6 oz (71 kg)   SpO2 98%   BMI 27.74 kg/m    Physical Exam Vitals reviewed. Exam conducted with a chaperone present.  Constitutional:      General: She is not in acute distress.    Appearance: She is well-developed.  HENT:     Head: Normocephalic and atraumatic.  Eyes:     General: No scleral icterus.    Conjunctiva/sclera: Conjunctivae normal.  Cardiovascular:     Rate and Rhythm: Normal rate.  Pulmonary:     Effort: Pulmonary effort is normal. No respiratory distress.  Genitourinary:    Exam position: Lithotomy position.     Pubic Area: No rash.       Comments: GYN:  External genitalia within normal limits.  Vaginal mucosa atrophic.  Nonfriable vaginal cuff, surgically absent cervix without lesions, no discharge or bleeding noted on speculum exam.    Mild cystocele Skin:    General: Skin is warm and dry.     Findings: No rash.  Neurological:     Mental Status: She is alert and oriented to person, place, and time.  Psychiatric:        Behavior: Behavior normal.       No results found for any visits on 06/19/22.  Assessment & Plan     1. Pain in female genitalia on intercourse - may be related to atrophic vaginosis vs small cystocele - start estrace cream as below - may benefit from pelvic floor PT - refer to UroGyn for further eval/management - Ambulatory referral to Urogynecology  2. Stress incontinence of urine - longstanding but worsening - reports having her bladder tacked after cystocele developed s/p hysterectomy several decades ago - slight cystocele currently - may benefit from pelvic floor PT - refer to UroGyn for further eval/management - Ambulatory referral to Urogynecology  3. Atrophic vaginitis - new problem - longstanding  - likely contributes to dyspareunia - start estrace 3 times weekly  Meds ordered this encounter  Medications   estradiol (ESTRACE VAGINAL) 0.1 MG/GM vaginal cream    Sig: Place 1 Applicatorful vaginally 3 (three) times a week.    Dispense:  42.5 g    Refill:  12     Return if symptoms worsen or fail to improve.      I, Shirlee Latch, MD, have reviewed all documentation for this visit. The documentation on 06/19/22 for the exam, diagnosis, procedures, and orders are all accurate and complete.   Genisis Sonnier, Marzella Schlein, MD, MPH [facility]

## 2022-06-30 ENCOUNTER — Ambulatory Visit: Payer: Medicare Other | Attending: Cardiology | Admitting: Cardiology

## 2022-06-30 ENCOUNTER — Ambulatory Visit (INDEPENDENT_AMBULATORY_CARE_PROVIDER_SITE_OTHER): Payer: Medicare Other

## 2022-06-30 ENCOUNTER — Encounter: Payer: Self-pay | Admitting: Cardiology

## 2022-06-30 VITALS — BP 116/78 | HR 68 | Ht 64.0 in | Wt 157.6 lb

## 2022-06-30 DIAGNOSIS — I10 Essential (primary) hypertension: Secondary | ICD-10-CM | POA: Diagnosis not present

## 2022-06-30 DIAGNOSIS — I4819 Other persistent atrial fibrillation: Secondary | ICD-10-CM

## 2022-06-30 NOTE — Patient Instructions (Signed)
Medication Instructions:   Your physician recommends that you continue on your current medications as directed. Please refer to the Current Medication list given to you today.  *If you need a refill on your cardiac medications before your next appointment, please call your pharmacy*   Lab Work:  None Ordered  If you have labs (blood work) drawn today and your tests are completely normal, you will receive your results only by: MyChart Message (if you have MyChart) OR A paper copy in the mail If you have any lab test that is abnormal or we need to change your treatment, we will call you to review the results.   Testing/Procedures:  Your physician has recommended that you wear a Zio monitor.   This monitor is a medical device that records the heart's electrical activity. Doctors most often use these monitors to diagnose arrhythmias. Arrhythmias are problems with the speed or rhythm of the heartbeat. The monitor is a small device applied to your chest. You can wear one while you do your normal daily activities. While wearing this monitor if you have any symptoms to push the button and record what you felt. Once you have worn this monitor for the period of time provider prescribed (Usually 14 days), you will return the monitor device in the postage paid box. Once it is returned they will download the data collected and provide [place] with a report which the provider will then review and we will call you with those results. Important tips:  Avoid showering during the first 24 hours of wearing the monitor. Avoid excessive sweating to help maximize wear time. Do not submerge the device, no hot tubs, and no swimming pools. Keep any lotions or oils away from the patch. After 24 hours you may shower with the patch on. Take brief showers with your back facing the shower head.  Do not remove patch once it has been placed because that will interrupt data and decrease adhesive wear time. Push the button  when you have any symptoms and write down what you were feeling. Once you have completed wearing your monitor, remove and place into box which has postage paid and place in your outgoing mailbox.  If for some reason you have misplaced your box then call our office and we can provide another box and/or mail it off for you.      Follow-Up: At [facility], you and your health needs are our priority.  As part of our continuing mission to provide you with exceptional heart care, we have created designated Provider Care Teams.  These Care Teams include your primary Cardiologist (physician) and Advanced Practice Providers (APPs -  Physician Assistants and Nurse Practitioners) who all work together to provide you with the care you need, when you need it.  We recommend signing up for the patient portal called "MyChart".  Sign up information is provided on this After Visit Summary.  MyChart is used to connect with patients for Virtual Visits (Telemedicine).  Patients are able to view lab/test results, encounter notes, upcoming appointments, etc.  Non-urgent messages can be sent to your provider as well.   To learn more about what you can do with MyChart, go to [email or web address].    Your next appointment:    1 year  Provider:   You may see Debbe Odea, MD or one of the following Advanced Practice Providers on your designated Care Team:   Nicolasa Ducking, NP Eula Listen, PA-C Cadence Fransico Michael, PA-C Charlsie Quest, NP

## 2022-06-30 NOTE — Progress Notes (Signed)
Cardiology Office Note:    Date:  06/30/2022   ID:  [name], DOB 13-Jul-1960, MRN [id number]  PCP:  Erasmo Downer, MD  Cardiologist:  Debbe Odea, MD  Electrophysiologist:  Lanier Prude, MD   Referring MD: Erasmo Downer, MD   Chief Complaint  Patient presents with   Follow-up    Patient reports chest pain and SOBr episodes 3-4 times a month with last episode 1 week ago.    History of Present Illness:    [name] is a 62 y.o. female with a hx of hypertension, paroxysmal atrial fibrillation/atrial flutter s/p ablation 07/2020, repeat ablation 08/2021 at [facility], on Eliquis, asthma, anxiety, GERD, presents for follow-up.    Patient has had 2 ablations since last visit, initially 07/2020 at [facility],  Atrial fibrillation persisted, repeat ablation was performed at [facility] 08/2021.  She states doing okay, has occasional palpitations maybe once or twice a month associated with chest pain.  Thinks palpitations/irregular heartbeat is secondary to A-fib.  Compliant with Cardizem and Eliquis as prescribed.  Plans to follow-up next month with sleep specialist for inspire device consideration.   Prior notes She was initially seen due to history of palpitations. Holter monitor was placed and showed atrial fibrillation 3% burden.  Eliquis and Toprol-XL was started.   Echocardiogram  On 06/2019 showed normal systolic function, EF 55 to 60%, mild LA dilatation.   Left heart cath on 05/2019 did not show any significant CAD.   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    Chronic headaches    Chronic,  continuous use of opioids    DDD (degenerative disc disease)    CERVICAL AND LUMBAR-CHRONIC PAIN, RT HIP LABRAL TEAR   Depression    PT STATES A LOT OF STRESS IN HER LIFE   Dissociative disorder    Dizziness 04/22/2013   Duodenal ulcer with hemorrhage and perforation 04/27/2003   Foot drop, right    FROM BACK SURGERY   GERD (gastroesophageal reflux disease)    H/O arthrodesis 08/18/2014   Headache(784.0)    AND NECK PAIN--STATES RECENT TEST SHOW CERVICAL DEGENERATION   History of blood transfusion    s/p back surgery   History of cardiac cath    a. 05/2019 Cath: Nl cors. EF 55-65%.   History of cardioversion    History of cervical spinal surgery 01/04/2015   History of kidney stones    Hypertension    Inverted T wave    Iron deficiency 02/01/2021   Mitral regurgitation    a. 07/2020 Echo: EF 60-65%, no rwma. Nl RV size/fxn. RVSP 39.79mmHg. Mildly to mod dil LA. Mod MR; b. 07/2020 TEE: EF 55-60%. Lambl's excresence. Nl RV size/fxn. Midly dil LA. No LA/LAA thrombus. Mild MR.   Narrowing of intervertebral disc space 08/18/2014   Currently on disability.    Orthostatic hypotension 04/22/2013   Pain    CHRONIC NECK AND BACK PAIN - LIMITED ROM NECK - S/P FUSIONS CERVICAL AND LUMBAR   Paroxysmal atrial fibrillation 2021   a. 07/2020 s/p  PVI.   Pneumonia    PONV (postoperative nausea and vomiting)    PT GIVES HX OF N&V AND FEVER WITH SURGERIES YEARS AGO--BUT NO PROBLEMS WITH MORE RECENT SURGERIES--STATES NOT MALIGNANT HYPERTHERMIA   Postop Hyponatremia 05/14/2012   Postoperative anemia due to acute blood loss 05/14/2012   PTSD (post-traumatic stress disorder)    Right foot drop    Right hip arthralgia 08/18/2014   Status post surgery of right, and now needs left.    Sleep apnea 2021   does not have a cpap   Therapeutic opioid-induced constipation (OIC)    Typical atrial flutter     Past Surgical History:  Procedure Laterality Date   ABDOMINAL HYSTERECTOMY     afib  05/2019    ANTERIOR CERVICAL DECOMP/DISCECTOMY FUSION N/A 10/30/2012   Procedure: ACDF C5-6, EXPLORATION AND HARDWARE REMOVAL C6-7;  Surgeon: Venita Lick, MD;  Location: [facility] OR;  Service: Orthopedics;  Laterality: N/A;   ANTERIOR FUSION CERVICAL SPINE  MAY 2012   AT [facility]   ATRIAL FIBRILLATION ABLATION N/A 08/06/2020   Procedure: ATRIAL FIBRILLATION ABLATION;  Surgeon: Lanier Prude, MD;  Location: [facility] INVASIVE CV LAB;  Service: Cardiovascular;  Laterality: N/A;   BACK SURGERY  2009   LUMBAR FUSION WITH RODS    BREAST BIOPSY Right 2008   benign.- bx/clip   BREAST EXCISIONAL BIOPSY Left 1998   benign   BREAST REDUCTION SURGERY Bilateral 06/2016   CARDIAC ELECTROPHYSIOLOGY MAPPING AND ABLATION     CARDIOVERSION N/A 12/03/2019   Procedure: CARDIOVERSION;  Surgeon: Debbe Odea, MD;  Location: [facility] ORS;  Service: Cardiovascular;  Laterality: N/A;   CARDIOVERSION N/A 08/01/2021   Procedure: CARDIOVERSION;  Surgeon: Debbe Odea, MD;  Location: [facility] ORS;  Service: Cardiovascular;  Laterality: N/A;   CARDIOVERSION N/A 08/05/2021   Procedure: CARDIOVERSION;  Surgeon: Antonieta Iba, MD;  Location: [facility] ORS;  Service: Cardiovascular;  Laterality: N/A;   CARPAL TUNNEL RELEASE  05-06-12   Right   CHOLECYSTECTOMY     COLONOSCOPY WITH PROPOFOL N/A 01/26/2017   Procedure: COLONOSCOPY WITH PROPOFOL;  Surgeon: Midge Minium, MD;  Location: [facility] SURGERY [facility];  Service: Endoscopy;  Laterality: N/A;   COLONOSCOPY WITH PROPOFOL N/A 01/16/2021   Procedure: COLONOSCOPY WITH PROPOFOL;  Surgeon: Toney Reil, MD;  Location: [facility] ENDOSCOPY;  Service: Gastroenterology;  Laterality: N/A;   DIAGNOSTIC LAPAROSCOPIES - MULTIPLE FOR ENDOMETRIOSIS     ESOPHAGEAL DILATION N/A 01/26/2017   Procedure: ESOPHAGEAL DILATION;  Surgeon: Midge Minium, MD;  Location: [facility] SURGERY [facility]TR;  Service: Endoscopy;  Laterality: N/A;   ESOPHAGEAL MANOMETRY N/A 05/30/2017   Procedure: ESOPHAGEAL MANOMETRY (EM);  Surgeon: Midge Minium, MD;  Location: [facility] ENDOSCOPY;  Service: Endoscopy;  Laterality: N/A;   ESOPHAGOGASTRODUODENOSCOPY (EGD) WITH PROPOFOL N/A 01/26/2017   Procedure: ESOPHAGOGASTRODUODENOSCOPY (EGD) WITH PROPOFOL;  Surgeon: Midge Minium, MD;  Location: [facility] SURGERY [facility];  Service: Endoscopy;  Laterality: N/A;   ESOPHAGOGASTRODUODENOSCOPY (EGD) WITH PROPOFOL N/A 01/30/2020   Procedure: ESOPHAGOGASTRODUODENOSCOPY (EGD) WITH PROPOFOL;  Surgeon: Midge Minium, MD;  Location: [facility] SURGERY [facility];  Service: Endoscopy;  Laterality: N/A;  sleep apnea COVID + 01-15-20   ESOPHAGOGASTRODUODENOSCOPY (EGD) WITH PROPOFOL N/A 01/15/2021   Procedure: ESOPHAGOGASTRODUODENOSCOPY (EGD) WITH PROPOFOL;  Surgeon: Toney Reil, MD;  Location: [facility] ENDOSCOPY;  Service: Gastroenterology;  Laterality: N/A;   ESOPHAGOGASTRODUODENOSCOPY (EGD) WITH PROPOFOL N/A 12/07/2021   Procedure: ESOPHAGOGASTRODUODENOSCOPY (EGD) WITH PROPOFOL;  Surgeon: Toney Reil, MD;  Location: [facility] ENDOSCOPY;  Service: Gastroenterology;  Laterality: N/A;   HIP ARTHROSCOPY  09/20/2011   Procedure: ARTHROSCOPY HIP;  Surgeon: Loanne Drilling, MD;  Location: [facility] ORS;  Service: Orthopedics;  Laterality: Right;  Right Hip Scope with Labral Debridement   LEFT HEART CATH AND CORONARY ANGIOGRAPHY Left 06/10/2019   Procedure: LEFT HEART CATH AND CORONARY ANGIOGRAPHY;  Surgeon: Yvonne Kendall, MD;  Location: [facility] INVASIVE CV LAB;  Service: Cardiovascular;  Laterality: Left;   LOWER EXTREMITY ANGIOGRAPHY Left 01/06/2022   Procedure: Lower Extremity Angiography;  Surgeon: Renford Dills, MD;  Location: [facility] INVASIVE CV LAB;  Service: Cardiovascular;  Laterality: Left;   NASAL SEPTUM SURGERY  MARCH 2013   IN [place]   [facility] IMPEDANCE STUDY N/A 05/30/2017   Procedure: PH IMPEDANCE STUDY;  Surgeon: Midge Minium, MD;  Location: [facility] ENDOSCOPY;  Service: Endoscopy;  Laterality: N/A;   POLYPECTOMY N/A 01/26/2017   Procedure: POLYPECTOMY;  Surgeon: Midge Minium,  MD;  Location: [facility] SURGERY CNTR;  Service: Endoscopy;  Laterality: N/A;   POSTERIOR CERVICAL FUSION/FORAMINOTOMY N/A 04/16/2013   Procedure: REMOVAL CERVICAL PLATES AND INTERBODY CAGE/POSTERIOR CERVICAL SPINAL FUSION C4 - C6/C5 CORPECTOMY/C4 - C6 FUSION WITH ILIAC CREST BONE GRAFT;  Surgeon: Venita Lick, MD;  Location: [facility] OR;  Service: Orthopedics;  Laterality: N/A;   RADIOFREQUENCY ABLATION NERVES     REDUCTION MAMMAPLASTY Bilateral 05/2016   RIGHT HIP ARTHROSCOPY FOR LABRAL TEAR  ABOUT 2010   2012 also   SHOULDER ARTHROSCOPY  05-06-12   bone spur   TEE WITHOUT CARDIOVERSION N/A 08/05/2020   Procedure: TRANSESOPHAGEAL ECHOCARDIOGRAM (TEE);  Surgeon: Lewayne Bunting, MD;  Location: [facility] ENDOSCOPY;  Service: Cardiovascular;  Laterality: N/A;   TONSILLECTOMY     AS A CHILD   TOTAL HIP ARTHROPLASTY Right 05/13/2012   Procedure: TOTAL HIP ARTHROPLASTY ANTERIOR APPROACH;  Surgeon: Loanne Drilling, MD;  Location: [facility] ORS;  Service: Orthopedics;  Laterality: Right;    Current Medications: Current Meds  Medication Sig   acetaminophen (TYLENOL) 325 MG tablet Take 2 tablets (650 mg total) by mouth every 6 (six) hours as needed for mild pain (or Fever >/= 101).   ALPRAZolam (XANAX) 1 MG tablet Take 1 mg by mouth at bedtime.    apixaban (ELIQUIS) 5 MG TABS tablet Take 1 tablet (5 mg total) by mouth 2 (two) times daily.   dexlansoprazole (DEXILANT) 60 MG capsule TAKE 1 CAPSULE BY MOUTH EVERY DAY   diltiazem (CARDIZEM CD) 240 MG 24 hr capsule Take 240 mg by mouth daily.   estradiol (ESTRACE VAGINAL) 0.1 MG/GM vaginal cream Place 1 Applicatorful vaginally 3 (three) times a week.   linaclotide (LINZESS) 290 MCG CAPS capsule TAKE 1 CAPSULE BY MOUTH DAILY BEFORE BREAKFAST.   montelukast (SINGULAIR) 10 MG tablet TAKE 1 TABLET BY MOUTH EVERY DAY   morphine (MSIR) 15 MG tablet Take 1 tablet (15 mg total) by mouth every 6 (six) hours as needed for moderate pain or severe pain. Must last 30 days.   morphine  (MSIR) 15 MG tablet Take 1 tablet (15 mg total) by mouth every 6 (six) hours as needed for moderate pain or severe pain. Must last 30 days.   [START ON 07/21/2022] morphine (MSIR) 15 MG tablet Take 1 tablet (15 mg total) by mouth every 6 (six) hours as needed for moderate pain or severe pain. Must last 30 days.   [START ON 08/20/2022] morphine (MSIR) 15 MG tablet Take 1 tablet (15 mg total) by mouth every 6 (six) hours as needed for moderate pain or severe pain. Must last 30 days.  naloxone (NARCAN) nasal spray 4 mg/0.1 mL Place 1 spray into the nose as needed for up to 365 doses (for opioid-induced respiratory depresssion). In case of emergency (overdose), spray once into each nostril. If no response within 3 minutes, repeat application and call 911.   valACYclovir (VALTREX) 1000 MG tablet TAKE TWO TABLETS BY MOUTH TWICE DAILY FOR ONE DAY FEVER BLISTER   [DISCONTINUED] diltiazem (CARDIZEM CD) 120 MG 24 hr capsule Take 1 capsule (120 mg total) by mouth daily.     Allergies:   Amoxicillin, Chlorhexidine gluconate, Clindamycin, Codeine, Erythromycin, Penicillin g, Sulfa antibiotics, Levofloxacin, Shellfish allergy, Decadron [dexamethasone], Mangifera indica, Papaya derivatives, Betadine [povidone iodine], Clarithromycin, Other, Povidone-iodine, and Prednisone   Social History   Socioeconomic History   Marital status: Married    Spouse name: bruce   Number of children: 2   Years of education: Not on file   Highest education level: Associate degree: occupational, Scientist, product/process development, or vocational program  Occupational History    Comment: disabled  Tobacco Use   Smoking status: Never   Smokeless tobacco: Never  Vaping Use   Vaping Use: Never used  Substance and Sexual Activity   Alcohol use: No    Alcohol/week: 0.0 standard drinks of alcohol   Drug use: Yes    Comment: prescribed morphine and xanax   Sexual activity: Yes  Other Topics Concern   Not on file  Social History Narrative   Son, daughter   And 4 grandkids; raises 3 of the grandchildren      Lives in [place]; with husband; never smoked; no alcohol; 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    Social Determinants of Health   Financial Resource Strain: Low Risk  (02/08/2021)   Overall Financial Resource Strain (CARDIA)    Difficulty of Paying Living Expenses: Not hard at all  Food Insecurity: No Food Insecurity (01/03/2022)   Hunger Vital Sign    Worried About Running Out of Food in the Last Year: Never true    Ran Out of Food in the Last Year: Never true  Transportation Needs: No Transportation Needs (01/03/2022)   PRAPARE - Administrator, Civil Service (Medical): No    Lack of Transportation (Non-Medical): No  Physical Activity: Insufficiently Active (02/08/2021)   Exercise Vital Sign    Days of Exercise per Week: 2 days    Minutes of Exercise per Session: 20 min  Stress: No Stress Concern Present (02/08/2021)   [employer] of Occupational Health - Occupational Stress Questionnaire    Feeling of Stress : Only a little  Social Connections: Moderately Integrated (02/08/2021)   Social Connection and Isolation Panel [NHANES]    Frequency of Communication with Friends and Family: More than three times a week    Frequency of Social Gatherings with Friends and Family: Never    Attends Religious Services: 1 to 4 times per year    Active Member of [employer] or Organizations: No    Attends Banker Meetings: Never    Marital Status: Married     Family History: The patient's family history includes Aneurysm in her maternal grandmother and mother; Anxiety disorder in her grandchild; Bipolar disorder in her grandchild and sister; Breast cancer (age of onset: 1) in her paternal grandmother; Depression in her grandchild. There is no history of Cancer.  ROS:   Please see the history of present illness.     All other systems reviewed and are negative.  EKGs/Labs/Other Studies Reviewed:    The following studies were  reviewed today:   EKG:  EKG is  ordered today.  The ekg ordered today demonstrates sinus rhythm, heart rate 60  Recent Labs: 08/08/2021: Magnesium 2.3 01/04/2022: ALT 10 05/05/2022: BUN 10; Creatinine, Ser 0.95; Hemoglobin 14.2; Platelets 371; Potassium 3.7; Sodium 137  Recent Lipid Panel    Component Value Date/Time   CHOL 156 08/05/2021 0213   CHOL 138 01/19/2021 1055   TRIG 74 08/05/2021 0213   HDL 56 08/05/2021 0213   HDL 49 01/19/2021 1055   CHOLHDL 2.8 08/05/2021 0213   VLDL 15 08/05/2021 0213   LDLCALC 85 08/05/2021 0213   LDLCALC 62 01/19/2021 1055    Physical Exam:    VS:  BP 116/78 (BP Location: Left Arm, Patient Position: Sitting, Cuff Size: Large)   Pulse 68   Ht  (1.626 m)   Wt 157 lb 9.6 oz (71.5 kg)   SpO2 98%   BMI 27.05 kg/m     Wt Readings from Last 3 Encounters:  06/30/22 157 lb 9.6 oz (71.5 kg)  06/19/22 156 lb 9.6 oz (71 kg)  06/14/22 160 lb (72.6 kg)     GEN:  Well nourished, mild distress HEENT: Normal NECK: No JVD; No carotid bruits CARDIAC: Regular rate and rhythm RESPIRATORY:  Clear to auscultation without rales, wheezing or rhonchi  ABDOMEN: Soft, non-tender, non-distended MUSCULOSKELETAL:  No edema; No deformity  SKIN: Warm and dry NEUROLOGIC:  Alert and oriented x 3 PSYCHIATRIC:  Normal affect   ASSESSMENT:    1. Persistent atrial fibrillation   2. Primary hypertension    PLAN:    In order of problems listed above:  Persistent A-fib, status post ablation 07/2020 at [facility], re-ablation 08/2021 at [facility].  Currently in sinus rhythm, occasional palpitations.  CHA2DS2-VASc score 2 (gender, htn). continue Eliquis 5 mg twice daily, Cardizem 240 mg daily.  Place cardiac monitor to evaluate A-fib burden.  Last echo 5/23, EF 55 to 60%, mild LA dilation.  Follow-up with A-fib clinic. History of hypertension, BP controlled.  Continue Cardizem 240 mg daily.  Follow-up in 1 yr   Medication Adjustments/Labs and Tests Ordered: Current  medicines are reviewed at length with the patient today.  Concerns regarding medicines are outlined above.  Orders Placed This Encounter  Procedures   Ambulatory referral to Cardiac Electrophysiology   LONG TERM MONITOR (3-14 DAYS)   EKG 12-Lead   No orders of the defined types were placed in this encounter.   Patient Instructions  Medication Instructions:   Your physician recommends that you continue on your current medications as directed. Please refer to the Current Medication list given to you today.  *If you need a refill on your cardiac medications before your next appointment, please call your pharmacy*   Lab Work:  None Ordered  If you have labs (blood work) drawn today and your tests are completely normal, you will receive your results only by: MyChart Message (if you have MyChart) OR A paper copy in the mail If you have any lab test that is abnormal or we need to change your treatment, we will call you to review the results.   Testing/Procedures:  Your physician has recommended that you wear a Zio monitor.   This monitor is a medical device that records the heart's electrical activity. Doctors most often use these monitors to diagnose arrhythmias. Arrhythmias are problems with the speed or rhythm of the heartbeat. The monitor is a small device applied to your chest. You can wear one while you do  your normal daily activities. While wearing this monitor if you have any symptoms to push the button and record what you felt. Once you have worn this monitor for the period of time provider prescribed (Usually 14 days), you will return the monitor device in the postage paid box. Once it is returned they will download the data collected and provide [place] with a report which the provider will then review and we will call you with those results. Important tips:  Avoid showering during the first 24 hours of wearing the monitor. Avoid excessive sweating to help maximize wear time. Do  not submerge the device, no hot tubs, and no swimming pools. Keep any lotions or oils away from the patch. After 24 hours you may shower with the patch on. Take brief showers with your back facing the shower head.  Do not remove patch once it has been placed because that will interrupt data and decrease adhesive wear time. Push the button when you have any symptoms and write down what you were feeling. Once you have completed wearing your monitor, remove and place into box which has postage paid and place in your outgoing mailbox.  If for some reason you have misplaced your box then call our office and we can provide another box and/or mail it off for you.      Follow-Up: At [facility], you and your health needs are our priority.  As part of our continuing mission to provide you with exceptional heart care, we have created designated Provider Care Teams.  These Care Teams include your primary Cardiologist (physician) and Advanced Practice Providers (APPs -  Physician Assistants and Nurse Practitioners) who all work together to provide you with the care you need, when you need it.  We recommend signing up for the patient portal called "MyChart".  Sign up information is provided on this After Visit Summary.  MyChart is used to connect with patients for Virtual Visits (Telemedicine).  Patients are able to view lab/test results, encounter notes, upcoming appointments, etc.  Non-urgent messages can be sent to your provider as well.   To learn more about what you can do with MyChart, go to [email or web address].    Your next appointment:    1 year  Provider:   You may see Debbe Odea, MD or one of the following Advanced Practice Providers on your designated Care Team:   Nicolasa Ducking, NP Eula Listen, PA-C Cadence Fransico Michael, PA-C Charlsie Quest, NP   Signed, Debbe Odea, MD  06/30/2022 11:48 AM    [facility]

## 2022-07-03 DIAGNOSIS — I4819 Other persistent atrial fibrillation: Secondary | ICD-10-CM | POA: Diagnosis not present
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
## 2022-07-06 ENCOUNTER — Ambulatory Visit: Payer: Medicare Other | Admitting: Obstetrics and Gynecology

## 2022-07-06 ENCOUNTER — Encounter: Payer: Self-pay | Admitting: Obstetrics and Gynecology

## 2022-07-06 VITALS — BP 126/83 | HR 65 | Ht 63.0 in | Wt 157.8 lb

## 2022-07-06 DIAGNOSIS — N393 Stress incontinence (female) (male): Secondary | ICD-10-CM | POA: Diagnosis not present

## 2022-07-06 DIAGNOSIS — M62838 Other muscle spasm: Secondary | ICD-10-CM | POA: Diagnosis not present

## 2022-07-06 DIAGNOSIS — N993 Prolapse of vaginal vault after hysterectomy: Secondary | ICD-10-CM | POA: Diagnosis not present

## 2022-07-06 DIAGNOSIS — R350 Frequency of micturition: Secondary | ICD-10-CM

## 2022-07-06 DIAGNOSIS — N8110 Cystocele, unspecified: Secondary | ICD-10-CM

## 2022-07-06 DIAGNOSIS — N816 Rectocele: Secondary | ICD-10-CM

## 2022-07-06 LAB — URINALYSIS, ROUTINE W REFLEX MICROSCOPIC
Bilirubin Urine: NEGATIVE
Glucose, UA: NEGATIVE mg/dL
Ketones, ur: NEGATIVE mg/dL
Leukocytes,Ua: NEGATIVE
Nitrite: NEGATIVE
Protein, ur: NEGATIVE mg/dL
Specific Gravity, Urine: 1.01 (ref 1.005–1.030)
pH: 6 (ref 5.0–8.0)

## 2022-07-06 LAB — URINALYSIS, MICROSCOPIC (REFLEX)

## 2022-07-06 LAB — POCT URINALYSIS DIPSTICK
Bilirubin, UA: NEGATIVE
Glucose, UA: NEGATIVE
Ketones, UA: NEGATIVE
Leukocytes, UA: NEGATIVE
Nitrite, UA: NEGATIVE
Protein, UA: NEGATIVE
Spec Grav, UA: 1.02 (ref 1.010–1.025)
Urobilinogen, UA: 4 E.U./dL — AB
pH, UA: 6 (ref 5.0–8.0)

## 2022-07-06 MED ORDER — DIAZEPAM 5 MG PO TABS: ORAL_TABLET | ORAL | 0 refills | Status: DC

## 2022-07-06 NOTE — Patient Instructions (Addendum)
Start pelvic floor PT. Weber Cooks is a wonderful therapist that I recommend at [facility]  We will start with Urodynamics testing  Information given on Urethral bulking, Surgical repair, and urodynamics.  Vaginal valium sent to pharmacy. Remember this is only meant to be used before sex and sparingly to see if this helps with muscle pain deep in the pelvis.

## 2022-07-13 NOTE — Progress Notes (Signed)
SEE NOTES FROM Edd Fabian, FNP.

## 2022-07-18 ENCOUNTER — Ambulatory Visit: Payer: Medicare Other | Admitting: Pain Medicine

## 2022-07-24 DIAGNOSIS — I4819 Other persistent atrial fibrillation: Secondary | ICD-10-CM | POA: Diagnosis not present

## 2022-08-05 ENCOUNTER — Other Ambulatory Visit: Payer: Self-pay | Admitting: Gastroenterology

## 2022-08-08 ENCOUNTER — Other Ambulatory Visit: Payer: Self-pay | Admitting: Obstetrics and Gynecology

## 2022-08-09 MED ORDER — DIAZEPAM 5 MG PO TABS: ORAL_TABLET | ORAL | 0 refills | Status: DC

## 2022-08-15 ENCOUNTER — Other Ambulatory Visit (INDEPENDENT_AMBULATORY_CARE_PROVIDER_SITE_OTHER): Payer: Self-pay | Admitting: Vascular Surgery

## 2022-08-15 DIAGNOSIS — I724 Aneurysm of artery of lower extremity: Secondary | ICD-10-CM

## 2022-08-17 ENCOUNTER — Ambulatory Visit (INDEPENDENT_AMBULATORY_CARE_PROVIDER_SITE_OTHER): Payer: Medicare Other | Admitting: Vascular Surgery

## 2022-08-17 ENCOUNTER — Ambulatory Visit (INDEPENDENT_AMBULATORY_CARE_PROVIDER_SITE_OTHER): Payer: Medicare Other

## 2022-08-17 DIAGNOSIS — I724 Aneurysm of artery of lower extremity: Secondary | ICD-10-CM

## 2022-08-20 ENCOUNTER — Other Ambulatory Visit: Payer: Self-pay | Admitting: Family Medicine

## 2022-08-20 DIAGNOSIS — J45909 Unspecified asthma, uncomplicated: Secondary | ICD-10-CM

## 2022-08-22 NOTE — Telephone Encounter (Signed)
Requested Prescriptions  Pending Prescriptions Disp Refills   montelukast (SINGULAIR) 10 MG tablet [Pharmacy Med Name: MONTELUKAST SOD 10 MG TABLET] 90 tablet 1    Sig: TAKE 1 TABLET BY MOUTH EVERY DAY     Pulmonology:  Leukotriene Inhibitors Passed - 08/20/2022 10:35 AM      Passed - Valid encounter within last 12 months    Recent Outpatient Visits           2 months ago Pain in female genitalia on intercourse   [facility] [place], Marzella Schlein, MD   2 months ago No-show for appointment   [facility] Alfredia Ferguson, PA-C   5 months ago Hematoma   [facility] [place], Marzella Schlein, MD   7 months ago Encounter for annual physical exam   [facility] [place], Marzella Schlein, MD   1 year ago Encounter for Medicare annual wellness exam   [facility] [place], Marzella Schlein, MD       Future Appointments             Tomorrow Sherie Don, NP [facility]   In 1 month Florian Buff, Rochel Brome, MD [facility] at [facility]   In 2 months Vanga, Loel Dubonnet, MD [facility] [place] [facility]   In 5 months Bacigalupo, Marzella Schlein, MD [facility]

## 2022-08-22 NOTE — Progress Notes (Unsigned)
Cardiology Office Note Date:  08/23/2022  Patient ID:  [name], DOB [birth year], MRN [id number] PCP:  Erasmo Downer, MD  Cardiologist:  Debbe Odea, MD & Dr. Emogene Morgan [facility]) Electrophysiologist: Lanier Prude, MD & Dr. Kathi Ludwig [facility])    Chief Complaint: Afib follow-up  History of Present Illness: [name] is a 62 y.o. female with PMH notable for persistent afib s/p ablatoin x 2, HTN, OSA,  seen today for Lanier Prude, MD for routine electrophysiology followup.  She is s/p PVI, posterior wall, and CTI ablation 07/2020 by Dr. Lalla Brothers, and S/p cryo pulmonary vein re-isolation, L atrial posterior wall re-isolation, linear RF ablation of mitral isthmus 08/2021 by Dr. Kathi Ludwig at [facility]  She was seen by [facility] EP team 04/2022, continued to have brief palpitation episodes, was also HTN > dilt increased to 240 daily.  She saw Dr. Azucena Cecil 06/2022, was continuing to have palpitations, so he ordered a 2 wk zio. This showed 22% AF burden.  Today, she states that she is miserable. She continues to very symptomatic A-fib episodes.  She is very frustrated her ablations have not controlled her A-fib as well as she thought they would.  Her symptoms during A-fib episodes include short of breath, she feels clammy and nauseated and at times has chest pain.  She is very fatigued throughout the day.  States that she does not have the energy to walk her dog.  She was recently evaluated by urogyn, and has surgery upcoming in August for bladder and vaginal wall prolapse.   she has had sleep evaluation, but has been unable to wear CPAP.  She is in process of being evaluated for Neita Garnet, but she feels overwhelmed with her health problem, and has put Inspira workup on the back burner. She diligently takes Eliquis twice a day, no missed doses, no bleeding concerns.  She was evaluated by [facility] due to insurance changes.  Her insurance has now changed again and [facility] is in network.  She states that  when she was being seen at [facility], they mentioned surgical procedure for her atrial fibrillation, she questions whether she can proceed with that here at [facility].  AAD History: Amiodarone, stopped after 2022 ablation d/t lack of AFib, young age, side effects (Nausea, upset stomach)  Past Medical History:  Diagnosis Date   Abnormal EKG    HX OF INVERTED T WAVES ON EKG, PALPITATIONS, CHEST PAINS-CARDIAC WORK UP DID NOT SHOW ANY HEART DISEASE   AC (acromioclavicular) joint bone spurs    Acute postoperative pain 01/04/2017   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    Chronic headaches    Chronic, continuous use of opioids    DDD (degenerative disc disease)    CERVICAL AND LUMBAR-CHRONIC PAIN, RT HIP LABRAL TEAR   Depression    PT STATES A LOT OF STRESS IN HER LIFE   Dissociative disorder    Dizziness 04/22/2013   Duodenal ulcer with hemorrhage and perforation (HCC) 04/27/2003   Foot drop, right    FROM BACK SURGERY   GERD (gastroesophageal reflux disease)    H/O arthrodesis 08/18/2014   Headache(784.0)    AND NECK PAIN--STATES RECENT TEST SHOW CERVICAL DEGENERATION   History of blood transfusion    s/p back surgery   History of cardiac cath  a. 05/2019 Cath: Nl cors. EF 55-65%.   History of cardioversion    History of cervical spinal surgery 01/04/2015   History of kidney stones    Hypertension    Inverted T wave    Iron deficiency 02/01/2021   Mitral regurgitation    a. 07/2020 Echo: EF 60-65%, no rwma. Nl RV size/fxn. RVSP 39.31mmHg. Mildly to mod dil LA. Mod MR; b. 07/2020 TEE: EF 55-60%. Lambl's excresence. Nl RV size/fxn. Midly dil LA. No LA/LAA thrombus. Mild MR.   Narrowing of intervertebral disc space 08/18/2014   Currently on disability.    Orthostatic hypotension 04/22/2013   Pain    CHRONIC NECK AND BACK PAIN - LIMITED ROM NECK - S/P  FUSIONS CERVICAL AND LUMBAR   Paroxysmal atrial fibrillation (HCC) 2021   a. 07/2020 s/p PVI.   Pneumonia    PONV (postoperative nausea and vomiting)    PT GIVES HX OF N&V AND FEVER WITH SURGERIES YEARS AGO--BUT NO PROBLEMS WITH MORE RECENT SURGERIES--STATES NOT MALIGNANT HYPERTHERMIA   Postop Hyponatremia 05/14/2012   Postoperative anemia due to acute blood loss 05/14/2012   PTSD (post-traumatic stress disorder)    Right foot drop    Right hip arthralgia 08/18/2014   Status post surgery of right, and now needs left.    Sleep apnea 2021   does not have a cpap   Therapeutic opioid-induced constipation (OIC)    Typical atrial flutter [facility])     Past Surgical History:  Procedure Laterality Date   ABDOMINAL HYSTERECTOMY     afib  05/2019   ANTERIOR CERVICAL DECOMP/DISCECTOMY FUSION N/A 10/30/2012   Procedure: ACDF C5-6, EXPLORATION AND HARDWARE REMOVAL C6-7;  Surgeon: Venita Lick, MD;  Location: [facility] OR;  Service: Orthopedics;  Laterality: N/A;   ANTERIOR FUSION CERVICAL SPINE  MAY 2012   AT [facility]   ATRIAL FIBRILLATION ABLATION N/A 08/06/2020   Procedure: ATRIAL FIBRILLATION ABLATION;  Surgeon: Lanier Prude, MD;  Location: [facility] INVASIVE CV LAB;  Service: Cardiovascular;  Laterality: N/A;   BACK SURGERY  2009   LUMBAR FUSION WITH RODS    BREAST BIOPSY Right 2008   benign.- bx/clip   BREAST EXCISIONAL BIOPSY Left 1998   benign   BREAST REDUCTION SURGERY Bilateral 06/2016   CARDIAC ELECTROPHYSIOLOGY MAPPING AND ABLATION     CARDIOVERSION N/A 12/03/2019   Procedure: CARDIOVERSION;  Surgeon: Debbe Odea, MD;  Location: [facility] ORS;  Service: Cardiovascular;  Laterality: N/A;   CARDIOVERSION N/A 08/01/2021   Procedure: CARDIOVERSION;  Surgeon: Debbe Odea, MD;  Location: [facility] ORS;  Service: Cardiovascular;  Laterality: N/A;   CARDIOVERSION N/A 08/05/2021   Procedure: CARDIOVERSION;  Surgeon: Antonieta Iba, MD;  Location: [facility] ORS;  Service: Cardiovascular;  Laterality: N/A;    CARPAL TUNNEL RELEASE  05-06-12   Right   CHOLECYSTECTOMY     COLONOSCOPY WITH PROPOFOL N/A 01/26/2017   Procedure: COLONOSCOPY WITH PROPOFOL;  Surgeon: Midge Minium, MD;  Location: [facility] SURGERY CNTR;  Service: Endoscopy;  Laterality: N/A;   COLONOSCOPY WITH PROPOFOL N/A 01/16/2021   Procedure: COLONOSCOPY WITH PROPOFOL;  Surgeon: Toney Reil, MD;  Location: [facility] ENDOSCOPY;  Service: Gastroenterology;  Laterality: N/A;   DIAGNOSTIC LAPAROSCOPIES - MULTIPLE FOR ENDOMETRIOSIS     ESOPHAGEAL DILATION N/A 01/26/2017   Procedure: ESOPHAGEAL DILATION;  Surgeon: Midge Minium, MD;  Location: [facility] SURGERY CNTR;  Service: Endoscopy;  Laterality: N/A;   ESOPHAGEAL MANOMETRY N/A 05/30/2017   Procedure: ESOPHAGEAL MANOMETRY (EM);  Surgeon: Midge Minium, MD;  Location: [facility] ENDOSCOPY;  Service: Endoscopy;  Laterality: N/A;   ESOPHAGOGASTRODUODENOSCOPY (EGD) WITH PROPOFOL N/A 01/26/2017   Procedure: ESOPHAGOGASTRODUODENOSCOPY (EGD) WITH PROPOFOL;  Surgeon: Midge Minium, MD;  Location: [facility];  Service: Endoscopy;  Laterality: N/A;   ESOPHAGOGASTRODUODENOSCOPY (EGD) WITH PROPOFOL N/A 01/30/2020   Procedure: ESOPHAGOGASTRODUODENOSCOPY (EGD) WITH PROPOFOL;  Surgeon: Midge Minium, MD;  Location: [facility] SURGERY [facility]TR;  Service: Endoscopy;  Laterality: N/A;  sleep apnea COVID + 01-15-20   ESOPHAGOGASTRODUODENOSCOPY (EGD) WITH PROPOFOL N/A 01/15/2021   Procedure: ESOPHAGOGASTRODUODENOSCOPY (EGD) WITH PROPOFOL;  Surgeon: Toney Reil, MD;  Location: [facility] ENDOSCOPY;  Service: Gastroenterology;  Laterality: N/A;   ESOPHAGOGASTRODUODENOSCOPY (EGD) WITH PROPOFOL N/A 12/07/2021   Procedure: ESOPHAGOGASTRODUODENOSCOPY (EGD) WITH PROPOFOL;  Surgeon: Toney Reil, MD;  Location: [facility] ENDOSCOPY;  Service: Gastroenterology;  Laterality: N/A;   HIP ARTHROSCOPY  09/20/2011   Procedure: ARTHROSCOPY HIP;  Surgeon: Loanne Drilling, MD;  Location: [facility] ORS;  Service: Orthopedics;  Laterality: Right;   Right Hip Scope with Labral Debridement   LEFT HEART CATH AND CORONARY ANGIOGRAPHY Left 06/10/2019   Procedure: LEFT HEART CATH AND CORONARY ANGIOGRAPHY;  Surgeon: Yvonne Kendall, MD;  Location: [facility] INVASIVE CV LAB;  Service: Cardiovascular;  Laterality: Left;   LOWER EXTREMITY ANGIOGRAPHY Left 01/06/2022   Procedure: Lower Extremity Angiography;  Surgeon: Renford Dills, MD;  Location: [facility] INVASIVE CV LAB;  Service: Cardiovascular;  Laterality: Left;   NASAL SEPTUM SURGERY  MARCH 2013   IN [place]   [facility] IMPEDANCE STUDY N/A 05/30/2017   Procedure: PH IMPEDANCE STUDY;  Surgeon: Midge Minium, MD;  Location: [facility] ENDOSCOPY;  Service: Endoscopy;  Laterality: N/A;   POLYPECTOMY N/A 01/26/2017   Procedure: POLYPECTOMY;  Surgeon: Midge Minium, MD;  Location: [facility];  Service: Endoscopy;  Laterality: N/A;   POSTERIOR CERVICAL FUSION/FORAMINOTOMY N/A 04/16/2013   Procedure: REMOVAL CERVICAL PLATES AND INTERBODY CAGE/POSTERIOR CERVICAL SPINAL FUSION C4 - C6/C5 CORPECTOMY/C4 - C6 FUSION WITH ILIAC CREST BONE GRAFT;  Surgeon: Venita Lick, MD;  Location: [facility] OR;  Service: Orthopedics;  Laterality: N/A;   RADIOFREQUENCY ABLATION NERVES     REDUCTION MAMMAPLASTY Bilateral 05/2016   RIGHT HIP ARTHROSCOPY FOR LABRAL TEAR  ABOUT 2010   2012 also   SHOULDER ARTHROSCOPY  05-06-12   bone spur   TEE WITHOUT CARDIOVERSION N/A 08/05/2020   Procedure: TRANSESOPHAGEAL ECHOCARDIOGRAM (TEE);  Surgeon: Lewayne Bunting, MD;  Location: [facility] ENDOSCOPY;  Service: Cardiovascular;  Laterality: N/A;   TONSILLECTOMY     AS A CHILD   TOTAL HIP ARTHROPLASTY Right 05/13/2012   Procedure: TOTAL HIP ARTHROPLASTY ANTERIOR APPROACH;  Surgeon: Loanne Drilling, MD;  Location: [facility] ORS;  Service: Orthopedics;  Laterality: Right;    Current Outpatient Medications  Medication Instructions   acetaminophen (TYLENOL) 650 mg, Oral, Every 6 hours PRN   ALPRAZolam (XANAX) 1 mg, Oral, Daily at bedtime   apixaban (ELIQUIS) 5  mg, Oral, 2 times daily   dexlansoprazole (DEXILANT) 60 mg, Oral, Daily   diazepam (VALIUM) 5 MG tablet Place 1 tablet vaginally nightly as needed for muscle spasm/ pelvic pain.   diltiazem (CARDIZEM CD) 240 mg, Oral, Daily   estradiol (ESTRACE VAGINAL) 0.1 MG/GM vaginal cream 1 Applicatorful, Vaginal, 3 times weekly   flecainide (TAMBOCOR) 100 mg, Oral, 2 times daily   linaclotide (LINZESS) 290 MCG CAPS capsule TAKE 1 CAPSULE BY MOUTH DAILY BEFORE BREAKFAST.   montelukast (SINGULAIR) 10 MG tablet TAKE 1 TABLET BY MOUTH EVERY DAY   morphine (MSIR) 15 mg, Oral, Every 6 hours PRN, Must  last 30 days.   morphine (MSIR) 15 mg, Oral, Every 6 hours PRN, Must last 30 days.   morphine (MSIR) 15 mg, Oral, Every 6 hours PRN, Must last 30 days.   naloxone (NARCAN) nasal spray 4 mg/0.1 mL 1 spray, Nasal, As needed, In case of emergency (overdose), spray once into each nostril. If no response within 3 minutes, repeat application and call 911.   valACYclovir (VALTREX) 1000 MG tablet TAKE TWO TABLETS BY MOUTH TWICE DAILY FOR ONE DAY FEVER BLISTER    Social History:  The patient  reports that she has never smoked. She has never used smokeless tobacco. She reports current drug use. She reports that she does not drink alcohol.   Family History:  The patient's family history includes Aneurysm in her maternal grandmother and mother; Anxiety disorder in her grandchild; Bipolar disorder in her grandchild and sister; Breast cancer (age of onset: 49) in her paternal grandmother; Depression in her grandchild.  ROS:  Please see the history of present illness. All other systems are reviewed and otherwise negative.   PHYSICAL EXAM:  VS:  BP 110/80 (BP Location: Left Arm, Patient Position: Sitting, Cuff Size: Normal)   Pulse 86   Ht 5\' 3"  (1.6 m)   Wt 154 lb (69.9 kg)   SpO2 98%   BMI 27.28 kg/m  BMI: Body mass index is 27.28 kg/m.  GEN- The patient is well appearing, alert and oriented x 3 today.   Lungs- Clear  to ausculation bilaterally, normal work of breathing.  Heart- Irregularly irregular rate and rhythm, no murmurs, rubs or gallops Extremities- No peripheral edema, warm, dry   EKG is ordered. Personal review of EKG from today shows:  Atypical atrial flutter, rate 92;   07/10/2022 EKG NSR, rate 68;  PR: 144 QRS 84  Recent Labs: 01/04/2022: ALT 10 05/05/2022: BUN 10; Creatinine, Ser 0.95; Hemoglobin 14.2; Platelets 371; Potassium 3.7; Sodium 137  No results found for requested labs within last 365 days.   CrCl cannot be calculated (Patient's most recent lab result is older than the maximum 21 days allowed.).   Wt Readings from Last 3 Encounters:  08/23/22 154 lb (69.9 kg)  07/06/22 157 lb 12.8 oz (71.6 kg)  06/30/22 157 lb 9.6 oz (71.5 kg)     Additional studies reviewed include: Previous EP, cardiology notes.   Long Term Monitor, 06/30/2022 22% A-fib/flutter burden, longest episode 7 hours 2 minutes A-fib/flutter was present at activation of device A-fib/flutter was detected within +/- 45 seconds of symptomatic patient events Rare SVE's and VE's  TTE, 07/31/2021 LVEF 55 to 60%, mild LVH Right and left atrium both normal in size  ASSESSMENT AND PLAN:  #) Paroxysmal A-fib S/p ablation x 2 Most recent Zio with 22% A-fib/flutter burden We discussed AAD drug options She is agreeable to starting flecainide 100 mg twice daily Continue diltiazem 240 mg daily EKG today stable intervals Follow-up ETT in 7-10 days We further discussed surgical atrial fibrillation procedure, and that [facility] does not currently perform this procedure.   #) Hypercoagulable due to paroxysmal A-fib CHA2DS2-VASc Score = 3 [CHF History: 0, HTN History: 1, Diabetes History: 0, Stroke History: 0, Vascular Disease History: 1, Age Score: 0, Gender Score: 1].  Therefore, the patient's annual risk of stroke is 3.2 %.  NOAC -5 mg Eliquis twice daily, appropriately dosed No bleeding concerns    #)  HTN At goal today.  Recommend checking blood pressures 1-2 times per week at home and recording the  values.  Recommend bringing these recordings to the primary care physician.   #) surgical clearance Revised cardiac risk index score 0, indicating a 0.4% of perioperative risk of major cardiac event If ETT stable, no further workup needed prior to surgery.  Current medicines are reviewed at length with the patient today.   The patient has concerns regarding her medicines.  The following changes were made today:   START 100mg  flecainide BID  Labs/ tests ordered today include:  Orders Placed This Encounter  Procedures   EKG 12-Lead     Disposition: Follow up with EP APP in  4-6 wks    Signed, Sherie Don, NP  08/23/22  11:12 AM  Electrophysiology [facility]   ADDENDUM:: I spoke to Dr. Lalla Brothers regarding surgical convergent procedure vs 3rd time re-do AF ablation. We reviewed Dr. Clarise Cruz procedural note that indicated patient continued to have atrial arrhythmia at the conclusion of the case.  Reasonable to try another repeat ablation to confirm that electrical pathways are quiet, or to proceed with surgical convergent procedure at [facility] w Dr. Denyce Robert vs at [facility] with Dr. Sindy Guadeloupe.   I spoke to patient about this, she does not wish to be seen at [facility]. She prefers to try endovascular ablation at this time. We have tentatively scheduled her for next available, OV with Dr. Lalla Brothers to discuss the procedure. She will continue flecainide in the meantime.

## 2022-08-23 ENCOUNTER — Encounter: Payer: Self-pay | Admitting: Cardiology

## 2022-08-23 ENCOUNTER — Ambulatory Visit: Payer: Medicare Other | Attending: Cardiology | Admitting: Cardiology

## 2022-08-23 ENCOUNTER — Telehealth: Payer: Self-pay | Admitting: *Deleted

## 2022-08-23 VITALS — BP 110/80 | HR 86 | Ht 63.0 in | Wt 154.0 lb

## 2022-08-23 DIAGNOSIS — I480 Paroxysmal atrial fibrillation: Secondary | ICD-10-CM | POA: Diagnosis not present

## 2022-08-23 DIAGNOSIS — D6869 Other thrombophilia: Secondary | ICD-10-CM | POA: Diagnosis not present

## 2022-08-23 DIAGNOSIS — I10 Essential (primary) hypertension: Secondary | ICD-10-CM

## 2022-08-23 DIAGNOSIS — R0602 Shortness of breath: Secondary | ICD-10-CM | POA: Diagnosis not present

## 2022-08-23 MED ORDER — FLECAINIDE ACETATE 100 MG PO TABS: 100.0000 mg | ORAL_TABLET | Freq: Two times a day (BID) | ORAL | 1 refills | Status: DC

## 2022-08-23 NOTE — Telephone Encounter (Signed)
ADDENDUM: PHONE # [phone] AND FAX # [phone]

## 2022-08-23 NOTE — Patient Instructions (Addendum)
Medication Instructions:  START flecainide 100 mg by mouth twice a day  *If you need a refill on your cardiac medications before your next appointment, please call your pharmacy*   Lab Work: No labs ordered  If you have labs (blood work) drawn today and your tests are completely normal, you will receive your results only by: MyChart Message (if you have MyChart) OR A paper copy in the mail If you have any lab test that is abnormal or we need to change your treatment, we will call you to review the results.   Testing/Procedures: Exercise Stress Test  An exercise stress test is a test to check how your heart works during exercise and checks for coronary artery disease. Your heart rate will be watched while you are resting and while you are exercising.  Patches (electrodes) will be put on your chest. Do not put lotions, powders, creams, or oils on your chest before the test. Wires will be connected to the patches. The wires will send signals to a machine to record your heartbeat. Wear comfortable shoes and clothing. Follow instructions from your doctor about what you cannot eat or drink before the test.  Before the procedure Follow instructions from your doctor about what you cannot eat or drink. Do not have any drinks or foods that have caffeine in them for 24 hours before the test, or as told by your doctor. This includes coffee, tea (even decaf tea), sodas, chocolate, and cocoa. Hold beta-blocker medicines for night before and the morning of. Take all other BP meds that aren't Bbs.  If you use an inhaler, bring it with you to the test. Do not use any products that have nicotine or tobacco in them, such as cigarettes and e-cigarettes. Stop using them at least 4 hours before the test. If you need help quitting, ask your doctor.   Follow-Up: At [facility], you and your health needs are our priority.  As part of our continuing mission to provide you with exceptional heart  care, we have created designated Provider Care Teams.  These Care Teams include your primary Cardiologist (physician) and Advanced Practice Providers (APPs -  Physician Assistants and Nurse Practitioners) who all work together to provide you with the care you need, when you need it.  We recommend signing up for the patient portal called "MyChart".  Sign up information is provided on this After Visit Summary.  MyChart is used to connect with patients for Virtual Visits (Telemedicine).  Patients are able to view lab/test results, encounter notes, upcoming appointments, etc.  Non-urgent messages can be sent to your provider as well.   To learn more about what you can do with MyChart, go to [email or web address].    Your next appointment:   4-6 week(s)  Provider:   Sherie Don, NP

## 2022-08-23 NOTE — Telephone Encounter (Signed)
   Pre-operative Risk Assessment    Patient Name: [name]  DOB: [birth year] MRN: [id number]      Request for Surgical Clearance    Procedure:   surgery for her pelvic organ prolapse and stress incontinence.  Date of Surgery:  Clearance TBD (he is planning surgery with Dr. Florian Buff in the Fall)                                Surgeon:  DR. Lanetta Inch Surgeon's Group or Practice Name:  [facility] Phone number:   Fax number:     Type of Clearance Requested:   - Medical  - Pharmacy:  Hold Apixaban (Eliquis)     Type of Anesthesia:  General    Additional requests/questions:    Elpidio Anis   08/23/2022, 8:46 AM  July 10, 2022   Dear Dr. Lalla Brothers,  My name is Job Founds and I'm Nurse practitioner under Dr. Lanetta Inch who is a Urogynecologist/female pelvic medicine and reconstructive surgery specialist at [facility]. I have been caring for our mutual patient, [name]. She is planning surgery with Dr. Florian Buff in the Fall, and I'm hoping that you can help to provide a letter stating her risk stratification and medical optimization for surgery.  Here are some details about her surgery: ? We are planning surgery for her pelvic organ prolapse and stress incontinence.  ? Her surgery will likely be a vaginal procedure that will last approximately 3 hours.  ? She will most likely undergo general anesthesia.   Please provide any specific instructions that may help with her perioperative management, including management of anticoagulation if applicable. Specifically, Eliquis.  If you could send a letter to our office [facility]) or to me through Epic, I would greatly appreciate it. Our contact information is above.  Sincerely,   Job Founds DNP, AGNP-C

## 2022-08-24 ENCOUNTER — Other Ambulatory Visit: Payer: Self-pay

## 2022-08-24 DIAGNOSIS — I480 Paroxysmal atrial fibrillation: Secondary | ICD-10-CM

## 2022-08-29 NOTE — Progress Notes (Signed)
PROVIDER NOTE: Information contained herein reflects review and annotations entered in association with encounter. Interpretation of such information and data should be left to medically-trained personnel. Information provided to patient can be located elsewhere in the medical record under "Patient Instructions". Document created using STT-dictation technology, any transcriptional errors that may result from process are unintentional.    Patient: [name]  Service Category: E/M  Provider: Oswaldo Done, MD  DOB: [birth year]  DOS: 08/30/2022  Referring Provider: Erasmo Downer, MD  MRN: [id number]  Specialty: Interventional Pain Management  PCP: Erasmo Downer, MD  Type: Established Patient  Setting: Ambulatory outpatient    Location: Office  Delivery: Face-to-face     HPI  [name], a 62 y.o. year old female, is here today because of her Chronic left shoulder pain [M25.512, G89.29]. 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); Chronic hip pain (2ry area of Pain) (Bilateral) (L>R); Chronic shoulder arthropathy (Left); Trigger point of shoulder region (Left); Enthesopathy of hip region (Left); Groin  pain, chronic, left; Other intervertebral disc degeneration, lumbar region; History of lumbar surgery; DDD (degenerative disc disease), lumbosacral; Neuropathic pain; Neurogenic pain; Spondylosis without myelopathy or radiculopathy, lumbosacral region; Chronic musculoskeletal pain; Cervical radiculitis (C4,C5,C8) (Left); DDD (degenerative disc disease), cervical; Foraminal stenosis of cervical region (C3-4) (Right); 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); Abnormal MRI, shoulder (Left: 03/20/2017) (Right: 12/11/2020); Chronic neck pain with history of cervical spinal surgery; Enthesopathy of shoulder (Left); Osteoarthritis of shoulder (Left); Symptoms referable to shoulder joint; Tendinopathy of rotator cuff (Left); Sprain of supraspinatus muscle or tendon, sequela (Left); Subdeltoid bursitis of shoulder (Left); Subacromial bursitis of shoulder (Left); Chronic shoulder pain (Left); Abnormal MRI, cervical spine (05/12/2020); Chronic upper back pain; Chronic shoulder pain (Right); Osteoarthritis of shoulder (Right); Arthropathy of shoulder (Right); C7 radiculopathy (Right); Cervical radiculopathy at C8 (Right); Muscle weakness of upper extremity (Right); Weakness of upper extremity (Right); Cervical fusion syndrome; Failed back syndrome of cervical spine; Cervicalgia; Chronic hip pain after total replacement (THR) (Right); Fall (06/13/2022); and Acute left-sided low back pain without sciatica on their pertinent problem list. Pain Assessment: Severity of Chronic pain is reported as a 7 /10. Location: Shoulder Left/pain radiaties down her left arm to your elbow. Onset: More than a month ago. Quality: Shooting, Aching, Constant, Throbbing, Discomfort, Tender. Timing: Constant. Modifying factor(s): ben gays,. Vitals:  height is 5\' 3"  (1.6 m) and weight is 154 lb (69.9 kg). Her temperature is 97.4 F (36.3 C) (abnormal). Her blood pressure is 147/94 (abnormal) and her pulse is 72. Her oxygen saturation  is 97%.  BMI: Estimated body mass index is 27.28 kg/m as calculated from the following:   Height as of this encounter: 5\' 3"  (1.6 m).   Weight as of this encounter: 154 lb (69.9 kg). Last encounter: 06/14/2022. Last procedure: 06/06/2022.  Reason for encounter: evaluation of worsening, or previously known (  established) problem.  The patient requested this appointment to evaluate her left "scapular" pain.  This is not a new problem since review of the electronic medical record shows that on 02/13/2017 an MRI of the left shoulder was ordered by Lelon Frohlich, NP for evaluation of that left shoulder pain.  Further review shows that although that MRI was ordered, the patient did not have it done until 03/20/2017.  The impression on the 2019 left shoulder MRI reads as follows: IMPRESSION: 1. Mild to moderate rotator cuff tendinopathy/tendinosis but no partial or full-thickness tear. 2. Intact long head biceps tendon and glenoid labrum. 3. Mild edema like signal abnormality in the supraspinatus muscle could be due to a muscle strain, partial tear or denervation process. 4. No significant findings for bony impingement. 5. Mild subacromial/subdeltoid bursitis.  On 12/16/2020 the patient was referred to physical therapy for chronic right shoulder pain.  On 06/06/2022 the patient had a left-sided suprascapular nerve RFA #3 which provided her with 100% relief of pain for the duration of the local anesthetic, after which it went down to a 75% improvement that she refers lasted just a couple weeks.  Her follow-up from this procedure was on 06/14/2022 and at that time she was still having the ongoing 75% improvement.  She refers that her pain is now back and feels worse than before.  She describes that the pain is so bad that she is having difficulty raising her arm.  Today she describes this pain to be over the lower portion of the scapula, the lateral aspect of her left shoulder, and she also describes some pain  in the left axillary region.  Because she has failed that radiofrequency ablation, I will be sending the patient to have an MRI of the shoulder.  The last time she had one was in 2019.  She describes that she is claustrophobic and cannot do MRIs that are not open.  We checked her old MRIs and it turns out that she had one done at "[facility]".  She describes that they have an open MRI available and that she wants to go there.  In addition to this, I will be ordering physical therapy for this left shoulder.  The patient was told to give [place] a call after having had the MRI so that we can go over those results.  In addition, I told her to consider notifying her orthopedic surgeon for them to also evaluate the MRI.  She indicated that he has been many years since she saw an orthopedic surgeon.  She describes having seen multiple but currently not being followed up by any.  Physical exam today shows the patient to have decreased range of motion of the left shoulder on abduction, flexion, extension, and and internal rotation as well as external rotation.  The patient was scheduled to return on 09/11/2022 for medication refill.  We will take care of that today.  Routine UDS ordered today.   RTCB: 12/18/2022   Today the patient has notified me just and around August/September she will be having bladder surgery.  Today I have provided the patient with the surgeons handout on recommendations for postop pain management on chronic pain patients.  Pharmacotherapy Assessment  Analgesic: Morphine IR 15 mg 1 tab p.o. every 6 hours (60 mg/day of morphine) MME/day: 60 mg/day.   Monitoring: [place] PMP: PDMP reviewed during this encounter.       Pharmacotherapy: No side-effects or adverse reactions reported. Compliance: No problems identified. Effectiveness: Clinically acceptable.  Brigitte Pulse, RN  08/30/2022  9:49 AM  Sign when Signing Visit Nursing Pain Medication Assessment:  Safety precautions to be  maintained throughout the outpatient stay will include: orient to surroundings, keep bed in low position, maintain call bell within reach at all times, provide assistance with transfer out of bed and ambulation.  Medication Inspection Compliance: Pill count conducted under aseptic conditions, in front of the patient. Neither the pills nor the bottle was removed from the patient's sight at any time. Once count was completed pills were immediately returned to the patient in their original bottle.  Medication: Morphine IR Pill/Patch Count:  102 of 120 pills remain Pill/Patch Appearance: Markings consistent with prescribed medication Bottle Appearance: Standard pharmacy container. Clearly labeled. Filled Date: 6 / 10 / 2024 Last Medication intake:  TodaySafety precautions to be maintained throughout the outpatient stay will include: orient to surroundings, keep bed in low position, maintain call bell within reach at all times, provide assistance with transfer out of bed and ambulation.     No results found for: "CBDTHCR" No results found for: "D8THCCBX" No results found for: "D9THCCBX"  UDS:  Summary  Date Value Ref Range Status  09/12/2021 Note  Final    Comment:    ==================================================================== ToxASSURE Select 13 (MW) ==================================================================== Test                             Result       Flag       Units  Drug Present and Declared for Prescription Verification   Alprazolam                     434          EXPECTED   ng/mg creat   Alpha-hydroxyalprazolam        264          EXPECTED   ng/mg creat    Source of alprazolam is a scheduled prescription medication. Alpha-    hydroxyalprazolam is an expected metabolite of alprazolam.    Morphine                       >11111       EXPECTED   ng/mg creat   Normorphine                    951          EXPECTED   ng/mg creat    Potential sources of large amounts of  morphine in the absence of    codeine include administration of morphine or use of heroin.     Normorphine is an expected metabolite of morphine.    Hydromorphone                  407          EXPECTED   ng/mg creat    Hydromorphone may be present as a metabolite of morphine;    concentrations of hydromorphone rarely exceed 5% of the morphine    concentration when this is the source of hydromorphone.  ==================================================================== Test                      Result    Flag   Units      Ref Range   Creatinine              90  mg/dL      >=54 ==================================================================== Declared Medications:  The flagging and interpretation on this report are based on the  following declared medications.  Unexpected results may arise from  inaccuracies in the declared medications.   **Note: The testing scope of this panel includes these medications:   Alprazolam (Xanax)  Morphine (MSIR)   **Note: The testing scope of this panel does not include the  following reported medications:   Amiodarone (Pacerone)  Apixaban (Eliquis)  Buspirone (Buspar)  Clonidine  Diltiazem  Eye Drops  Linaclotide (Linzess)  Menthol  Montelukast (Singulair)  Omeprazole (Prilosec)  Ondansetron (Zofran)  Pancrelipase (Creon)  Polyethylene Glycol (MiraLAX)  Sodium Chloride  Valacyclovir (Valtrex) ==================================================================== For clinical consultation, please call [phone]. ====================================================================       ROS  Constitutional: Denies any fever or chills Gastrointestinal: No reported hemesis, hematochezia, vomiting, or acute GI distress Musculoskeletal: Denies any acute onset joint swelling, redness, loss of ROM, or weakness Neurological: No reported episodes of acute onset apraxia, aphasia, dysarthria, agnosia, amnesia, paralysis, loss  of coordination, or loss of consciousness  Medication Review  ALPRAZolam, acetaminophen, apixaban, dexlansoprazole, diazepam, diltiazem, estradiol, flecainide, linaclotide, montelukast, morphine, naloxone, and valACYclovir  History Review  Allergy: [name] is allergic to amoxicillin, chlorhexidine gluconate, clindamycin, codeine, erythromycin, penicillin g, sulfa antibiotics, levofloxacin, shellfish allergy, decadron [dexamethasone], mangifera indica, papaya derivatives, betadine [povidone iodine], clarithromycin, other, povidone-iodine, and prednisone. Drug: [name]  reports current drug use. Alcohol:  reports no history of alcohol use. Tobacco:  reports that she has never smoked. She has never used smokeless tobacco. Social: [name]  reports that she has never smoked. She has never used smokeless tobacco. She reports current drug use. She reports that she does not drink alcohol. 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), Anemia, Anxiety, Asthma, Cephalalgia (08/18/2014), Cervical disc disease (08/18/2014), Chronic headaches, Chronic, continuous use of opioids, DDD (degenerative disc disease), Depression, Dissociative disorder, Dizziness (04/22/2013), Duodenal ulcer with hemorrhage and perforation (HCC) (04/27/2003), Foot drop, right, GERD (gastroesophageal reflux disease), H/O arthrodesis (08/18/2014), Headache(784.0), History of blood transfusion, History of cardiac cath, History of cardioversion, History of cervical spinal surgery (01/04/2015), History of kidney stones, Hypertension, Inverted T wave, Iron deficiency (02/01/2021), Mitral regurgitation, Narrowing of intervertebral disc space (08/18/2014), Orthostatic hypotension (04/22/2013), Pain, Paroxysmal atrial fibrillation (HCC) (2021), Pneumonia, PONV (postoperative nausea and vomiting), Postop Hyponatremia  (05/14/2012), Postoperative anemia due to acute blood loss (05/14/2012), PTSD (post-traumatic stress disorder), Right foot drop, Right hip arthralgia (08/18/2014), Sleep apnea (2021), Therapeutic opioid-induced constipation (OIC), and Typical atrial flutter (HCC). Surgical: [name]  has a past surgical history that includes RIGHT HIP ARTHROSCOPY FOR LABRAL TEAR (ABOUT 2010); Anterior fusion cervical spine (MAY 2012); Nasal septum surgery [facility] 2013); Back surgery (2009); Tonsillectomy; Abdominal hysterectomy; DIAGNOSTIC LAPAROSCOPIES - MULTIPLE FOR ENDOMETRIOSIS; Cholecystectomy; Hip arthroscopy (09/20/2011); Shoulder arthroscopy (05-06-12); Carpal tunnel release (05-06-12); Total hip arthroplasty (Right, 05/13/2012); Anterior cervical decomp/discectomy fusion (N/A, 10/30/2012); Posterior cervical fusion/foraminotomy (N/A, 04/16/2013); Breast reduction surgery (Bilateral, 06/2016); Colonoscopy with propofol (N/A, 01/26/2017); Esophagogastroduodenoscopy (egd) with propofol (N/A, 01/26/2017); Esophageal dilation (N/A, 01/26/2017); polypectomy (N/A, 01/26/2017); PH impedance study (N/A, 05/30/2017); Esophageal manometry (N/A, 05/30/2017); Radiofrequency ablation nerves; afib (05/2019); LEFT HEART CATH AND CORONARY ANGIOGRAPHY (Left, 06/10/2019); Cardioversion (N/A, 12/03/2019); Esophagogastroduodenoscopy (egd) with propofol (N/A, 01/30/2020); Reduction mammaplasty (Bilateral, 05/2016); Breast biopsy (Right, 2008); Breast excisional biopsy (Left, 1998); TEE without cardioversion (N/A, 08/05/2020); ATRIAL FIBRILLATION ABLATION (N/A, 08/06/2020); Cardiac electrophysiology mapping  and ablation; Esophagogastroduodenoscopy (egd) with propofol (N/A, 01/15/2021); Colonoscopy with propofol (N/A, 01/16/2021); Cardioversion (N/A, 08/01/2021); Cardioversion (N/A, 08/05/2021); Esophagogastroduodenoscopy (egd) with propofol (N/A, 12/07/2021); and Lower Extremity Angiography (Left, 01/06/2022). Family: family history includes Aneurysm in her  maternal grandmother and mother; Anxiety disorder in her grandchild; Bipolar disorder in her grandchild and sister; Breast cancer (age of onset: 47) in her paternal grandmother; Depression in her grandchild.  Laboratory Chemistry Profile   Renal Lab Results  Component Value Date   BUN 10 05/05/2022   CREATININE 0.95 05/05/2022   BCR 9 (L) 01/19/2021   GFRAA >60 11/26/2019   GFRNONAA >60 05/05/2022    Hepatic Lab Results  Component Value Date   AST 12 (L) 01/04/2022   ALT 10 01/04/2022   ALBUMIN 3.3 (L) 01/04/2022   ALKPHOS 87 01/04/2022   LIPASE 26 08/08/2021    Electrolytes Lab Results  Component Value Date   NA 137 05/05/2022   K 3.7 05/05/2022   CL 102 05/05/2022   CALCIUM 9.2 05/05/2022   MG 2.3 08/08/2021    Bone Lab Results  Component Value Date   25OHVITD1 23 (L) 11/11/2018   25OHVITD2 9.8 11/11/2018   25OHVITD3 13 11/11/2018    Inflammation (CRP: Acute Phase) (ESR: Chronic Phase) Lab Results  Component Value Date   CRP 12 (H) 11/11/2018   ESRSEDRATE 42 (H) 11/11/2018   LATICACIDVEN 1.0 01/14/2021         Note: Above Lab results reviewed.  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 Performing Technologist: Hardie Lora RVT    Examination Guidelines: A complete evaluation includes B-mode imaging, spectral Doppler, color Doppler, and power Doppler as needed of all accessible portions of each vessel. Bilateral testing is considered an  integral part of a complete examination. Limited examinations for reoccurring indications may be performed as noted.  +-----------+----------+---------+------+----------+ Left DuplexPSV (cm/s)Waveform PlaqueComment(s) +-----------+----------+---------+------+----------+ CFA            89    triphasic                 +-----------+----------+---------+------+----------+ PFA            74    triphasic                 +-----------+----------+---------+------+----------+ Prox SFA       81    triphasic                 +-----------+----------+---------+------+----------+  Left Vein comments:   Summary: No evidence of pseudoaneurysm, AVF or DVT Region where previously documented coiled pseudoaneurysm no longer appears to have a well defined structure. Diffuse hematoma noted with no active bleeding.  Diagnosing physician: Levora Dredge MD Electronically signed by Levora Dredge MD on 08/24/2022 at 9:01:01 AM.         --------------------------------------------------------------------------------      Final   Note: Reviewed        Physical Exam  General appearance: Well  nourished, well developed, and well hydrated. In no apparent acute distress Mental status: Alert, oriented x 3 (person, place, & time)       Respiratory: No evidence of acute respiratory distress Eyes: PERLA Vitals: BP (!) 147/94   Pulse 72   Temp (!) 97.4 F (36.3 C)   Ht 5\' 3"  (1.6 m)   Wt 154 lb (69.9 kg)   SpO2 97%   BMI 27.28 kg/m  BMI: Estimated body mass index is 27.28 kg/m as calculated from the following:   Height as of this encounter: 5\' 3"  (1.6 m).   Weight as of this encounter: 154 lb (69.9 kg). Ideal: Ideal body weight: 52.4 kg (115 lb 8.3 oz) Adjusted ideal body weight: 59.4 kg (130 lb 14.6 oz)  Assessment   Diagnosis Status  1. Chronic shoulder pain (Left)   2. Chronic low back pain (1ry area of Pain) (Bilateral) (L>R) w/o sciatica   3. Chronic hip pain (2ry  area of Pain) (Bilateral) (L>R)   4. Chronic pain syndrome   5. Chronic shoulder pain (3ry area of Pain) (Bilateral) (L>R)   6. Chronic shoulder arthropathy (Left)   7. Osteoarthritis of shoulder (Left)   8. Chronic shoulder pain (Right)   9. Enthesopathy of shoulder (Left)   10. Symptoms referable to shoulder joint   11. Sprain of supraspinatus muscle or tendon, sequela (Left)   12. 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 Osteoarthritis of shoulder (Right)   14. Abnormal MRI, shoulder (Left: 03/20/2017) (Right: 12/11/2020)   15. Chronic anticoagulation (Eliquis)   16. Cervicalgia   17. Pharmacologic therapy   18. Cervicogenic headache   19. Chronic upper back pain   20. Lumbar facet syndrome (Bilateral) (L>R)   21. Failed back syndrome of cervical spine   22. Chronic musculoskeletal pain   23. Encounter for medication management   24. 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 Encounter for chronic pain management   26. Chronic use of opiate for therapeutic purpose   27. Chronic neck pain with history of cervical spinal surgery   28. Long term prescription benzodiazepine use   29. High risk medication use   30. At risk for respiratory depression due to opioid    Worsened Controlled Controlled   Updated Problems: No problems updated.  Plan of Care  Problem-specific:  No problem-specific Assessment & Plan notes found for this encounter.  [name] has a current medication list which includes the following long-term medication(s): apixaban, dexlansoprazole, diltiazem, flecainide, linzess, montelukast, [START ON 09/19/2022] morphine, [START ON 10/19/2022] morphine, and [START ON 11/18/2022] morphine.  Pharmacotherapy (Medications Ordered): Meds ordered this encounter  Medications   morphine (MSIR) 15 MG tablet    Sig: Take 1 tablet (15 mg total) by mouth every 6 (six) hours as needed for moderate pain or severe pain. Must last 30 days.     Dispense:  120 tablet    Refill:  0    DO NOT: delete (not duplicate); no partial-fill (will deny script to complete), no refill request (F/U required). DISPENSE: 1 day early if closed on fill date. WARN: No CNS-depressants within 8 hrs of med.   morphine (MSIR) 15 MG tablet    Sig: Take 1 tablet (15 mg total) by mouth every 6 (six) hours as needed for moderate pain or severe pain. Must last 30 days.    Dispense:  120 tablet    Refill:  0    DO NOT: delete (  not duplicate); no partial-fill (will deny script to complete), no refill request (F/U required). DISPENSE: 1 day early if closed on fill date. WARN: No CNS-depressants within 8 hrs of med.   morphine (MSIR) 15 MG tablet    Sig: Take 1 tablet (15 mg total) by mouth every 6 (six) hours as needed for moderate pain or severe pain. Must last 30 days.    Dispense:  120 tablet    Refill:  0    DO NOT: delete (not duplicate); no partial-fill (will deny script to complete), no refill request (F/U required). DISPENSE: 1 day early if closed on fill date. WARN: No CNS-depressants within 8 hrs of med.   Orders:  Orders Placed This Encounter  Procedures   MR SHOULDER LEFT WO CONTRAST    Standing Status:   Future    Standing Expiration Date:   09/29/2022    Scheduling Instructions:     Please make sure that the patient understands that this needs to be done as soon as possible. Never have the patient do the imaging "just before the next appointment". Inform patient that having the imaging done within the [facility] will expedite the availability of the results and will provide      imaging availability to the requesting physician. In addition inform the patient that the imaging order has an expiration date and will not be renewed if not done within the active period.    Order Specific Question:   What is the patient's sedation requirement?    Answer:   No Sedation    Order Specific Question:   Does the patient have a pacemaker or implanted devices?     Answer:   No    Order Specific Question:   Preferred imaging location?    Answer:   External    Order Specific Question:   Call Results- Best Contact Number?    Answer:   [phone] [facility])    Order Specific Question:   Radiology Contrast Protocol - do NOT remove file path    Answer:   [email or web address]   ToxASSURE Select 13 (MW), Urine    Volume: 30 ml(s). Minimum 3 ml of urine is needed. Document temperature of fresh sample. Indications: Long term (current) use of opiate analgesic (Z79.891)    Order Specific Question:   Release to patient    Answer:   Immediate   Ambulatory referral to Physical Therapy    Referral Priority:   Routine    Referral Type:   Physical Medicine    Referral Reason:   Specialty Services Required    Requested Specialty:   Physical Therapy    Number of Visits Requested:   1   Nursing Instructions:    1). STAT: UDS required today. 2). Make sure to document all opioids and benzodiazepines taken, including time of last intake. 3). If order is entered on a procedure day, make sure sample is obtained before any medications are administered.   Follow-up plan:   Return for Eval-day (M,W), (F2F), for review of ordered tests (mri).      Interventional Therapies  Risk Factors  Considerations:   ELIQUIS ANTICOAGULATION: (Stop: 3 days  Restart: 6 hours) ALLERGY: CONTRAST; shellfish; STEROIDS (dexamethasone, prednisone); Iodine HARDWARE: Not a candidate for RFA or SCS (cervical or lumbar). Patient not interested in IT pump (due to surgical risks). Concomitant BENZO therapy.  (At risk of respiratory depression) A-fib/flutter; unstable angina; CAD; (+) Cardiac cath; (+) cardioversion; mitral regurgitation Multiple allergies; OSA; GERD;  IBS; OIC Depression; dissociative disorder; PTSD        Planned  Pending:   Left shoulder MRI and PT ordered today (08/30/2022)   Under consideration:   Diagnostic right suprascapular  NB #1 (without steroids)  Possible left cervical ESI #1 (With steroids, if suprascapular nerve RFA does not eliminate all of the pain in the left shoulder.)   Completed (No Steroids):   Diagnostic bilateral lumbar facet MBB x2 (No Steroids)) (01/14/2019) (100/100/100/0)  Therapeutic bilateral suprascapular NB x2 (No Steroids) (06/19/2016) (80/80/0)  Therapeutic left suprascapular nerve RFA x3 (05/13/2020) (100/100/75/0)    Therapeutic  Palliative (PRN) options:   Diagnostic bilateral lumbar facet MBB #2  Palliative bilateral suprascapular NB #3 (No Steroids) Palliative left suprascapular nerve RFA #2    Pharmacotherapy  Failed trial of membrane stabilizers (Neurontin/Lyrica).       Recent Visits Date Type Provider Dept  06/14/22 Office Visit Delano Metz, MD [facility]  06/06/22 Procedure visit Delano Metz, MD [facility]  Showing recent visits within past 90 days and meeting all other requirements Today's Visits Date Type Provider Dept  08/30/22 Office Visit Delano Metz, MD [facility]  Showing today's visits and meeting all other requirements Future Appointments No visits were found meeting these conditions. Showing future appointments within next 90 days and meeting all other requirements  I discussed the assessment and treatment plan with the patient. The patient was provided an opportunity to ask questions and all were answered. The patient agreed with the plan and demonstrated an understanding of the instructions.  Patient advised to call back or seek an in-person evaluation if the symptoms or condition worsens.  Duration of encounter: 43 minutes.  Total time on encounter, as per AMA guidelines included both the face-to-face and non-face-to-face time personally spent by the physician and/or other qualified health care professional(s) on the day of the encounter (includes time in activities that require the physician or other  qualified health care professional and does not include time in activities normally performed by clinical staff). Physician's time may include the following activities when performed: Preparing to see the patient (e.g., pre-charting review of records, searching for previously ordered imaging, lab work, and nerve conduction tests) Review of prior analgesic pharmacotherapies. Reviewing PMP Interpreting ordered tests (e.g., lab work, imaging, nerve conduction tests) Performing post-procedure evaluations, including interpretation of diagnostic procedures Obtaining and/or reviewing separately obtained history Performing a medically appropriate examination and/or evaluation Counseling and educating the patient/family/caregiver Ordering medications, tests, or procedures Referring and communicating with other health care professionals (when not separately reported) Documenting clinical information in the electronic or other health record Independently interpreting results (not separately reported) and communicating results to the patient/ family/caregiver Care coordination (not separately reported)  Note by: Oswaldo Done, MD Date: 08/30/2022; Time: 10:18 AM

## 2022-08-30 ENCOUNTER — Ambulatory Visit: Payer: Medicare Other | Attending: Pain Medicine | Admitting: Pain Medicine

## 2022-08-30 ENCOUNTER — Encounter: Payer: Self-pay | Admitting: Pain Medicine

## 2022-08-30 VITALS — BP 147/94 | HR 72 | Temp 97.4°F | Ht 63.0 in | Wt 154.0 lb

## 2022-08-30 DIAGNOSIS — R2991 Unspecified symptoms and signs involving the musculoskeletal system: Secondary | ICD-10-CM

## 2022-08-30 DIAGNOSIS — M961 Postlaminectomy syndrome, not elsewhere classified: Secondary | ICD-10-CM | POA: Diagnosis not present

## 2022-08-30 DIAGNOSIS — M542 Cervicalgia: Secondary | ICD-10-CM

## 2022-08-30 DIAGNOSIS — S46812S Strain of other muscles, fascia and tendons at shoulder and upper arm level, left arm, sequela: Secondary | ICD-10-CM | POA: Diagnosis not present

## 2022-08-30 DIAGNOSIS — Z9889 Other specified postprocedural states: Secondary | ICD-10-CM | POA: Insufficient documentation

## 2022-08-30 DIAGNOSIS — M25511 Pain in right shoulder: Secondary | ICD-10-CM | POA: Diagnosis not present

## 2022-08-30 DIAGNOSIS — M7918 Myalgia, other site: Secondary | ICD-10-CM

## 2022-08-30 DIAGNOSIS — M47816 Spondylosis without myelopathy or radiculopathy, lumbar region: Secondary | ICD-10-CM

## 2022-08-30 DIAGNOSIS — M549 Dorsalgia, unspecified: Secondary | ICD-10-CM | POA: Diagnosis not present

## 2022-08-30 DIAGNOSIS — M545 Low back pain, unspecified: Secondary | ICD-10-CM | POA: Diagnosis not present

## 2022-08-30 DIAGNOSIS — G4486 Cervicogenic headache: Secondary | ICD-10-CM

## 2022-08-30 DIAGNOSIS — M19011 Primary osteoarthritis, right shoulder: Secondary | ICD-10-CM

## 2022-08-30 DIAGNOSIS — G8928 Other chronic postprocedural pain: Secondary | ICD-10-CM

## 2022-08-30 DIAGNOSIS — M25552 Pain in left hip: Secondary | ICD-10-CM | POA: Diagnosis not present

## 2022-08-30 DIAGNOSIS — Z79891 Long term (current) use of opiate analgesic: Secondary | ICD-10-CM

## 2022-08-30 DIAGNOSIS — M19012 Primary osteoarthritis, left shoulder: Secondary | ICD-10-CM | POA: Diagnosis not present

## 2022-08-30 DIAGNOSIS — Z7901 Long term (current) use of anticoagulants: Secondary | ICD-10-CM | POA: Diagnosis present

## 2022-08-30 DIAGNOSIS — Z9189 Other specified personal risk factors, not elsewhere classified: Secondary | ICD-10-CM

## 2022-08-30 DIAGNOSIS — M778 Other enthesopathies, not elsewhere classified: Secondary | ICD-10-CM

## 2022-08-30 DIAGNOSIS — G8929 Other chronic pain: Secondary | ICD-10-CM | POA: Diagnosis not present

## 2022-08-30 DIAGNOSIS — G894 Chronic pain syndrome: Secondary | ICD-10-CM | POA: Diagnosis not present

## 2022-08-30 DIAGNOSIS — M25512 Pain in left shoulder: Secondary | ICD-10-CM | POA: Diagnosis not present

## 2022-08-30 DIAGNOSIS — M25551 Pain in right hip: Secondary | ICD-10-CM

## 2022-08-30 DIAGNOSIS — R936 Abnormal findings on diagnostic imaging of limbs: Secondary | ICD-10-CM

## 2022-08-30 DIAGNOSIS — Z79899 Other long term (current) drug therapy: Secondary | ICD-10-CM | POA: Diagnosis not present

## 2022-08-30 MED ORDER — MORPHINE SULFATE 15 MG PO TABS
15.0000 mg | ORAL_TABLET | Freq: Four times a day (QID) | ORAL | 0 refills | Status: AC | PRN
Start: 2022-11-18 — End: 2022-12-18

## 2022-08-30 MED ORDER — MORPHINE SULFATE 15 MG PO TABS
15.0000 mg | ORAL_TABLET | Freq: Four times a day (QID) | ORAL | 0 refills | Status: DC | PRN
Start: 2022-09-19 — End: 2022-11-27

## 2022-08-30 MED ORDER — MORPHINE SULFATE 15 MG PO TABS
15.0000 mg | ORAL_TABLET | Freq: Four times a day (QID) | ORAL | 0 refills | Status: DC | PRN
Start: 2022-10-19 — End: 2022-11-27

## 2022-08-30 NOTE — Progress Notes (Signed)
Nursing Pain Medication Assessment:  Safety precautions to be maintained throughout the outpatient stay will include: orient to surroundings, keep bed in low position, maintain call bell within reach at all times, provide assistance with transfer out of bed and ambulation.  Medication Inspection Compliance: Pill count conducted under aseptic conditions, in front of the patient. Neither the pills nor the bottle was removed from the patient's sight at any time. Once count was completed pills were immediately returned to the patient in their original bottle.  Medication: Morphine IR Pill/Patch Count:  102 of 120 pills remain Pill/Patch Appearance: Markings consistent with prescribed medication Bottle Appearance: Standard pharmacy container. Clearly labeled. Filled Date: 6 / 10 / 2024 Last Medication intake:  TodaySafety precautions to be maintained throughout the outpatient stay will include: orient to surroundings, keep bed in low position, maintain call bell within reach at all times, provide assistance with transfer out of bed and ambulation.

## 2022-08-30 NOTE — Patient Instructions (Signed)
______________________________________________________________________  Procedure instructions  Do not eat or drink fluids (other than water) for 6 hours before your procedure  No water for 2 hours before your procedure  Take your blood pressure medicine with a sip of water  Arrive 30 minutes before your appointment  Carefully read the "Preparing for your procedure" detailed instructions  If you have questions call us at [phone]  _____________________________________________________________________    ______________________________________________________________________  Preparing for your procedure  Appointments: If you think you may not be able to keep your appointment, call 24-48 hours in advance to cancel. We need time to make it available to others.  During your procedure appointment there will be: No Prescription Refills. No disability issues to discussed. No medication changes or discussions.  Instructions: Food intake: Avoid eating anything solid for at least 8 hours prior to your procedure. Clear liquid intake: You may take clear liquids such as water up to 2 hours prior to your procedure. (No carbonated drinks. No soda.) Transportation: Unless otherwise stated by your physician, bring a driver. Morning Medicines: Except for blood thinners, take all of your other morning medications with a sip of water. Make sure to take your heart and blood pressure medicines. If your blood pressure's lower number is above 100, the case will be rescheduled. Blood thinners: Make sure to stop your blood thinners as instructed.  If you take a blood thinner, but were not instructed to stop it, call our office [phone] and ask to talk to a nurse. Not stopping a blood thinner prior to certain procedures could lead to serious complications. Diabetics on insulin: Notify the staff so that you can be scheduled 1st case in the morning. If your diabetes requires high dose insulin,  take only  of your normal insulin dose the morning of the procedure and notify the staff that you have done so. Preventing infections: Shower with an antibacterial soap the morning of your procedure.  Build-up your immune system: Take 1000 mg of Vitamin C with every meal (3 times a day) the day prior to your procedure. Antibiotics: Inform the nursing staff if you are taking any antibiotics or if you have any conditions that may require antibiotics prior to procedures. (Example: recent joint implants)   Pregnancy: If you are pregnant make sure to notify the nursing staff. Not doing so may result in injury to the fetus, including death.  Sickness: If you have a cold, fever, or any active infections, call and cancel or reschedule your procedure. Receiving steroids while having an infection may result in complications. Arrival: You must be in the facility at least 30 minutes prior to your scheduled procedure. Tardiness: Your scheduled time is also the cutoff time. If you do not arrive at least 15 minutes prior to your procedure, you will be rescheduled.  Children: Do not bring any children with you. Make arrangements to keep them home. Dress appropriately: There is always a possibility that your clothing may get soiled. Avoid long dresses. Valuables: Do not bring any jewelry or valuables.  Reasons to call and reschedule or cancel your procedure: (Following these recommendations will minimize the risk of a serious complication.) Surgeries: Avoid having procedures within 2 weeks of any surgery. (Avoid for 2 weeks before or after any surgery). Flu Shots: Avoid having procedures within 2 weeks of a flu shots or . (Avoid for 2 weeks before or after immunizations). Barium: Avoid having a procedure within 7-10 days after having had a radiological study involving the use of   radiological contrast. (Myelograms, Barium swallow or enema study). Heart attacks: Avoid any elective procedures or surgeries for the  initial 6 months after a "Myocardial Infarction" (Heart Attack). Blood thinners: It is imperative that you stop these medications before procedures. Let us know if you if you take any blood thinner.  Infection: Avoid procedures during or within two weeks of an infection (including chest colds or gastrointestinal problems). Symptoms associated with infections include: Localized redness, fever, chills, night sweats or profuse sweating, burning sensation when voiding, cough, congestion, stuffiness, runny nose, sore throat, diarrhea, nausea, vomiting, cold or Flu symptoms, recent or current infections. It is specially important if the infection is over the area that we intend to treat. Heart and lung problems: Symptoms that may suggest an active cardiopulmonary problem include: cough, chest pain, breathing difficulties or shortness of breath, dizziness, ankle swelling, uncontrolled high or unusually low blood pressure, and/or palpitations. If you are experiencing any of these symptoms, cancel your procedure and contact your primary care physician for an evaluation.  Remember:  Regular Business hours are:  Monday to Thursday 8:00 AM to 4:00 PM  Provider's Schedule: Milinda Pointer, MD:  Procedure days: Tuesday and Thursday 7:30 AM to 4:00 PM  Gillis Santa, MD:  Procedure days: Monday and Wednesday 7:30 AM to 4:00 PM  ______________________________________________________________________    ____________________________________________________________________________________________  General Risks and Possible Complications  Patient Responsibilities: It is important that you read this as it is part of your informed consent. It is our duty to inform you of the risks and possible complications associated with treatments offered to you. It is your responsibility as a patient to read this and to ask questions about anything that is not clear or that you believe was not covered in this  document.  Patient's Rights: You have the right to refuse treatment. You also have the right to change your mind, even after initially having agreed to have the treatment done. However, under this last option, if you wait until the last second to change your mind, you may be charged for the materials used up to that point.  Introduction: Medicine is not an Chief Strategy Officer. Everything in Medicine, including the lack of treatment(s), carries the potential for danger, harm, or loss (which is by definition: Risk). In Medicine, a complication is a secondary problem, condition, or disease that can aggravate an already existing one. All treatments carry the risk of possible complications. The fact that a side effects or complications occurs, does not imply that the treatment was conducted incorrectly. It must be clearly understood that these can happen even when everything is done following the highest safety standards.  No treatment: You can choose not to proceed with the proposed treatment alternative. The "PRO(s)" would include: avoiding the risk of complications associated with the therapy. The "CON(s)" would include: not getting any of the treatment benefits. These benefits fall under one of three categories: diagnostic; therapeutic; and/or palliative. Diagnostic benefits include: getting information which can ultimately lead to improvement of the disease or symptom(s). Therapeutic benefits are those associated with the successful treatment of the disease. Finally, palliative benefits are those related to the decrease of the primary symptoms, without necessarily curing the condition (example: decreasing the pain from a flare-up of a chronic condition, such as incurable terminal cancer).  General Risks and Complications: These are associated to most interventional treatments. They can occur alone, or in combination. They fall under one of the following six (6) categories: no benefit or worsening of symptoms;  bleeding; infection; nerve damage; allergic reactions; and/or death. No benefits or worsening of symptoms: In Medicine there are no guarantees, only probabilities. No healthcare provider can ever guarantee that a medical treatment will work, they can only state the probability that it may. Furthermore, there is always the possibility that the condition may worsen, either directly, or indirectly, as a consequence of the treatment. Bleeding: This is more common if the patient is taking a blood thinner, either prescription or over the counter (example: Goody Powders, Fish oil, Aspirin, Garlic, etc.), or if suffering a condition associated with impaired coagulation (example: Hemophilia, cirrhosis of the liver, low platelet counts, etc.). However, even if you do not have one on these, it can still happen. If you have any of these conditions, or take one of these drugs, make sure to notify your treating physician. Infection: This is more common in patients with a compromised immune system, either due to disease (example: diabetes, cancer, human immunodeficiency virus [HIV], etc.), or due to medications or treatments (example: therapies used to treat cancer and rheumatological diseases). However, even if you do not have one on these, it can still happen. If you have any of these conditions, or take one of these drugs, make sure to notify your treating physician. Nerve Damage: This is more common when the treatment is an invasive one, but it can also happen with the use of medications, such as those used in the treatment of cancer. The damage can occur to small secondary nerves, or to large primary ones, such as those in the spinal cord and brain. This damage may be temporary or permanent and it may lead to impairments that can range from temporary numbness to permanent paralysis and/or brain death. Allergic Reactions: Any time a substance or material comes in contact with our body, there is the possibility of an  allergic reaction. These can range from a mild skin rash (contact dermatitis) to a severe systemic reaction (anaphylactic reaction), which can result in death. Death: In general, any medical intervention can result in death, most of the time due to an unforeseen complication. ____________________________________________________________________________________________    ____________________________________________________________________________________________  Blood Thinners  IMPORTANT NOTICE:  If you take any of these, make sure to notify the nursing staff.  Failure to do so may result in injury.  Recommended time intervals to stop and restart blood-thinners, before & after invasive procedures  Generic Name Brand Name Pre-procedure. Stop this long before procedure. Post-procedure. Minimum waiting period before restarting.  Abciximab Reopro 15 days 2 hrs  Alteplase Activase 10 days 10 days  Anagrelide Agrylin    Apixaban Eliquis 3 days 6 hrs  Cilostazol Pletal 3 days 5 hrs  Clopidogrel Plavix 7-10 days 2 hrs  Dabigatran Pradaxa 5 days 6 hrs  Dalteparin Fragmin 24 hours 4 hrs  Dipyridamole Aggrenox 11days 2 hrs  Edoxaban Lixiana; Savaysa 3 days 2 hrs  Enoxaparin  Lovenox 24 hours 4 hrs  Eptifibatide Integrillin 8 hours 2 hrs  Fondaparinux  Arixtra 72 hours 12 hrs  Hydroxychloroquine Plaquenil 11 days   Prasugrel Effient 7-10 days 6 hrs  Reteplase Retavase 10 days 10 days  Rivaroxaban Xarelto 3 days 6 hrs  Ticagrelor Brilinta 5-7 days 6 hrs  Ticlopidine Ticlid 10-14 days 2 hrs  Tinzaparin Innohep 24 hours 4 hrs  Tirofiban Aggrastat 8 hours 2 hrs  Warfarin Coumadin 5 days 2 hrs   Other medications with blood-thinning effects  Product indications Generic (Brand) names Note  Cholesterol Lipitor Stop 4 days before procedure    Blood thinner (injectable) Heparin (LMW or LMWH Heparin) Stop 24 hours before procedure  Cancer Ibrutinib (Imbruvica) Stop 7 days before procedure   Malaria/Rheumatoid Hydroxychloroquine (Plaquenil) Stop 11 days before procedure  Thrombolytics  10 days before or after procedures   Over-the-counter (OTC) Products with blood-thinning effects  Product Common names Stop Time  Aspirin > 325 mg Goody Powders, Excedrin, etc. 11 days  Aspirin ? 81 mg  7 days  Fish oil  4 days  Garlic supplements  7 days  Ginkgo biloba  36 hours  Ginseng  24 hours  NSAIDs Ibuprofen, Naprosyn, etc. 3 days  Vitamin E  4 days   ____________________________________________________________________________________________   ____________________________________________________________________________________________  Opioid Pain Medication Update  To: All patients taking opioid pain medications. (I.e.: hydrocodone, hydromorphone, oxycodone, oxymorphone, morphine, codeine, methadone, tapentadol, tramadol, buprenorphine, fentanyl, etc.)  Re: Updated review of side effects and adverse reactions of opioid analgesics, as well as new information about long term effects of this class of medications.  Direct risks of long-term opioid therapy are not limited to opioid addiction and overdose. Potential medical risks include serious fractures, breathing problems during sleep, hyperalgesia, immunosuppression, chronic constipation, bowel obstruction, myocardial infarction, and tooth decay secondary to xerostomia.  Unpredictable adverse effects that can occur even if you take your medication correctly: Cognitive impairment, respiratory depression, and death. Most people think that if they take their medication "correctly", and "as instructed", that they will be safe. Nothing could be farther from the truth. In reality, a significant amount of recorded deaths associated with the use of opioids has occurred in individuals that had taken the medication for a long time, and were taking their medication correctly. The following are examples of how this can happen: Patient taking  his/her medication for a long time, as instructed, without any side effects, is given a certain antibiotic or another unrelated medication, which in turn triggers a "Drug-to-drug interaction" leading to disorientation, cognitive impairment, impaired reflexes, respiratory depression or an untoward event leading to serious bodily harm or injury, including death.  Patient taking his/her medication for a long time, as instructed, without any side effects, develops an acute impairment of liver and/or kidney function. This will lead to a rapid inability of the body to breakdown and eliminate their pain medication, which will result in effects similar to an "overdose", but with the same medicine and dose that they had always taken. This again may lead to disorientation, cognitive impairment, impaired reflexes, respiratory depression or an untoward event leading to serious bodily harm or injury, including death.  A similar problem will occur with patients as they grow older and their liver and kidney function begins to decrease as part of the aging process.  Background information: Historically, the original case for using long-term opioid therapy to treat chronic noncancer pain was based on safety assumptions that subsequent experience has called into question. In 1996, the American Pain Society and the American Academy of Pain Medicine issued a consensus statement supporting long-term opioid therapy. This statement acknowledged the dangers of opioid prescribing but concluded that the risk for addiction was low; respiratory depression induced by opioids was short-lived, occurred mainly in opioid-naive patients, and was antagonized by pain; tolerance was not a common problem; and efforts to control diversion should not constrain opioid prescribing. This has now proven to be wrong. Experience regarding the risks for opioid addiction, misuse, and overdose in community practice has failed to support these  assumptions.  According to the Centers for Disease Control and Prevention, fatal  overdoses involving opioid analgesics have increased sharply over the past decade. Currently, more than 96,700 people die from drug overdoses every year. Opioids are a factor in 7 out of every 10 overdose deaths. Deaths from drug overdose have surpassed motor vehicle accidents as the leading cause of death for individuals between the ages of 19 and 15.  Clinical data suggest that neuroendocrine dysfunction may be very common in both men and women, potentially causing hypogonadism, erectile dysfunction, infertility, decreased libido, osteoporosis, and depression. Recent studies linked higher opioid dose to increased opioid-related mortality. Controlled observational studies reported that long-term opioid therapy may be associated with increased risk for cardiovascular events. Subsequent meta-analysis concluded that the safety of long-term opioid therapy in elderly patients has not been proven.   Side Effects and adverse reactions: Common side effects: Drowsiness (sedation). Dizziness. Nausea and vomiting. Constipation. Physical dependence -- Dependence often manifests with withdrawal symptoms when opioids are discontinued or decreased. Tolerance -- As you take repeated doses of opioids, you require increased medication to experience the same effect of pain relief. Respiratory depression -- This can occur in healthy people, especially with higher doses. However, people with COPD, asthma or other lung conditions may be even more susceptible to fatal respiratory impairment.  Uncommon side effects: An increased sensitivity to feeling pain and extreme response to pain (hyperalgesia). Chronic use of opioids can lead to this. Delayed gastric emptying (the process by which the contents of your stomach are moved into your small intestine). Muscle rigidity. Immune system and hormonal dysfunction. Quick, involuntary muscle  jerks (myoclonus). Arrhythmia. Itchy skin (pruritus). Dry mouth (xerostomia).  Long-term side effects: Chronic constipation. Sleep-disordered breathing (SDB). Increased risk of bone fractures. Hypothalamic-pituitary-adrenal dysregulation. Increased risk of overdose.  RISKS: Fractures and Falls:  Opioids increase the risk and incidence of falls. This is of particular importance in elderly patients.  Endocrine System:  Long-term administration is associated with endocrine abnormalities (endocrinopathies). (Also known as Opioid-induced Endocrinopathy) Influences on both the hypothalamic-pituitary-adrenal axis?and the hypothalamic-pituitary-gonadal axis have been demonstrated with consequent hypogonadism and adrenal insufficiency in both sexes. Hypogonadism and decreased levels of dehydroepiandrosterone sulfate have been reported in men and women. Endocrine effects include: Amenorrhoea in women (abnormal absence of menstruation) Reduced libido in both sexes Decreased sexual function Erectile dysfunction in men Hypogonadisms (decreased testicular function with shrinkage of testicles) Infertility Depression and fatigue Loss of muscle mass Anxiety Depression Immune suppression Hyperalgesia Weight gain Anemia Osteoporosis Patients (particularly women of childbearing age) should avoid opioids. There is insufficient evidence to recommend routine monitoring of asymptomatic patients taking opioids in the long-term for hormonal deficiencies.  Immune System: Human studies have demonstrated that opioids have an immunomodulating effect. These effects are mediated via opioid receptors both on immune effector cells and in the central nervous system. Opioids have been demonstrated to have adverse effects on antimicrobial response and anti-tumour surveillance. Buprenorphine has been demonstrated to have no impact on immune function.  Opioid Induced Hyperalgesia: Human studies have  demonstrated that prolonged use of opioids can lead to a state of abnormal pain sensitivity, sometimes called opioid induced hyperalgesia (OIH). Opioid induced hyperalgesia is not usually seen in the absence of tolerance to opioid analgesia. Clinically, hyperalgesia may be diagnosed if the patient on long-term opioid therapy presents with increased pain. This might be qualitatively and anatomically distinct from pain related to disease progression or to breakthrough pain resulting from development of opioid tolerance. Pain associated with hyperalgesia tends to be more diffuse than the pre-existing pain and less defined in  quality. Management of opioid induced hyperalgesia requires opioid dose reduction.  Cancer: Chronic opioid therapy has been associated with an increased risk of cancer among noncancer patients with chronic pain. This association was more evident in chronic strong opioid users. Chronic opioid consumption causes significant pathological changes in the small intestine and colon. Epidemiological studies have found that there is a link between opium dependence and initiation of gastrointestinal cancers. Cancer is the second leading cause of death after cardiovascular disease. Chronic use of opioids can cause multiple conditions such as GERD, immunosuppression and renal damage as well as carcinogenic effects, which are associated with the incidence of cancers.   Mortality: Long-term opioid use has been associated with increased mortality among patients with chronic non-cancer pain (CNCP).  Prescription of long-acting opioids for chronic noncancer pain was associated with a significantly increased risk of all-cause mortality, including deaths from causes other than overdose.  Reference: Von Korff M, Kolodny A, Deyo RA, Chou R. Long-term opioid therapy reconsidered. Ann Intern Med. 2011 Sep 6;155(5):325-8. doi: 10.7326/[id number]. PMID: 16109604; PMCID: [id number]. Randon Goldsmith, Hayward RA, Dunn KM, [place] KP. Risk of adverse events in patients prescribed long-term opioids: A cohort study in the [place] [facility] [employer]. Eur J Pain. 2019 May;23(5):908-922. doi: 10.1002/ejp.1357. Epub 2019 Jan 31. PMID: 78295621. Colameco S, Coren JS, Ciervo CA. Continuous opioid treatment for chronic noncancer pain: a time for moderation in prescribing. Postgrad Med. 2009 Jul;121(4):61-6. doi: 10.3810/pgm.2009.07.2032. PMID: 30865784. William Hamburger RN, [place] SD, Blazina I, Cristopher [place], Bougatsos C, Deyo RA. The effectiveness and risks of long-term opioid therapy for chronic pain: a systematic review for a [employer] [facility] [employer] to [employer]. Ann Intern Med. 2015 Feb 17;162(4):276-86. doi: 10.7326/M14-2559. PMID: 69629528. Caryl Bis [facility], Makuc DM. NCHS Data Brief No. 22. [place]: Centers for Disease Control and Prevention; 2009. Sep, Increase in Fatal Poisonings Involving Opioid Analgesics in the [place], 1999-2006. Song IA, Choi HR, Oh TK. Long-term opioid use and mortality in patients with chronic non-cancer pain: Ten-year follow-up study in [place] from 2010 through 2019. EClinicalMedicine. 2022 Jul 18;51:101558. doi: 10.1016/j.eclinm.2022.413244. PMID: 01027253; PMCID: [id number]. Huser, W., Schubert, T., Vogelmann, T. et al. All-cause mortality in patients with long-term opioid therapy compared with non-opioid analgesics for chronic non-cancer pain: a database study. BMC Med 18, 162 (2020). http://lester.info/ Rashidian H, Karie Kirks, Malekzadeh R, Haghdoost AA. An Ecological Study of the Association between Opiate Use and Incidence of Cancers. Addict Health. 2016 Fall;8(4):252-260. PMID: 59563875; PMCID: [id number].  Our Goal: Our goal is to control your pain with means other than the use of opioid pain medications.  Our Recommendation: Talk to your  physician about coming off of these medications. We can assist you with the tapering down and stopping these medicines. Based on the new information, even if you cannot completely stop the medication, a decrease in the dose may be associated with a lesser risk. Ask for other means of controlling the pain. Decrease or eliminate those factors that significantly contribute to your pain such as smoking, obesity, and a diet heavily tilted towards "inflammatory" nutrients.  Last Updated: 05/10/2022   ____________________________________________________________________________________________     ____________________________________________________________________________________________  Transfer of Pain Medication between Pharmacies  Re: 2023 DEA Clarification on existing regulation  Published on DEA Website: November 11, 2021  Title: Revised Regulation Allows DEA-Registered Pharmacies to Electrical engineer Prescriptions at a Patient's Request DEA Headquarters Division - [employer]  "Patients now  have the ability to request their electronic prescription be transferred to another pharmacy without having to go back to their practitioner to initiate the request. This revised regulation went into effect on Monday, November 07, 2021.     At a patient's request, a DEA-registered retail pharmacy can now transfer an electronic prescription for a controlled substance (schedules II-V) to another DEA-registered retail pharmacy. Prior to this change, patients would have to go through their practitioner to cancel their prescription and have it re-issued to a different pharmacy. The process was taxing and time consuming for both patients and practitioners.    The Drug Enforcement Administration [facility]) published its intent to revise the process for transferring electronic prescriptions on January 30, 2020.  The final rule was published in the federal register on October 06, 2021 and went into effect  30 days later.  Under the final rule, a prescription can only be transferred once between pharmacies, and only if allowed under existing state or other applicable law. The prescription must remain in its electronic form; may not be altered in any way; and the transfer must be communicated directly between two licensed pharmacists. It's important to note, any authorized refills transfer with the original prescription, which means the entire prescription will be filled at the same pharmacy."    REFERENCES: 1. DEA website announcement HugeHand.is  2. Department of Justice website  [email or web address].pdf  3. DEPARTMENT OF JUSTICE Drug Enforcement Administration 21 CFR Part 1306 [Docket No. [id number]] RIN 1117-AB64 "Transfer of Electronic Prescriptions for Schedules II-V Controlled Substances Between Pharmacies for Initial Filling"  ____________________________________________________________________________________________     _______________________________________________________________________  Medication Rules  Purpose: To inform patients, and their family members, of our medication rules and regulations.  Applies to: All patients receiving prescriptions from our practice (written or electronic).  Pharmacy of record: This is the pharmacy where your electronic prescriptions will be sent. Make sure we have the correct one.  Electronic prescriptions: In compliance with the [facility] Strengthen Opioid Misuse Prevention (STOP) Act of 2017 (Session Conni Elliot [phone]), effective March 13, 2018, all controlled substances must be electronically prescribed. Written prescriptions, faxing, or calling prescriptions to a pharmacy will no longer be done.  Prescription refills: These will be provided only during in-person appointments. No medications  will be renewed without a "face-to-face" evaluation with your provider. Applies to all prescriptions.  NOTE: The following applies primarily to controlled substances (Opioid* Pain Medications).   Type of encounter (visit): For patients receiving controlled substances, face-to-face visits are required. (Not an option and not up to the patient.)  Patient's responsibilities: Pain Pills: Bring all pain pills to every appointment (except for procedure appointments). Pill Bottles: Bring pills in original pharmacy bottle. Bring bottle, even if empty. Always bring the bottle of the most recent fill.  Medication refills: You are responsible for knowing and keeping track of what medications you are taking and when is it that you will need a refill. The day before your appointment: write a list of all prescriptions that need to be refilled. The day of the appointment: give the list to the admitting nurse. Prescriptions will be written only during appointments. No prescriptions will be written on procedure days. If you forget a medication: it will not be "Called in", "Faxed", or "electronically sent". You will need to get another appointment to get these prescribed. No early refills. Do not call asking to have your prescription filled early. Partial  or short prescriptions: Occasionally your pharmacy may not have enough pills to fill  your prescription.  NEVER ACCEPT a partial fill or a prescription that is short of the total amount of pills that you were prescribed.  With controlled substances the law allows 72 hours for the pharmacy to complete the prescription.  If the prescription is not completed within 72 hours, the pharmacist will require a new prescription to be written. This means that you will be short on your medicine and we WILL NOT send another prescription to complete your original prescription.  Instead, request the pharmacy to send a carrier to a nearby branch to get enough medication to provide you  with your full prescription. Prescription Accuracy: You are responsible for carefully inspecting your prescriptions before leaving our office. Have the discharge nurse carefully go over each prescription with you, before taking them home. Make sure that your name is accurately spelled, that your address is correct. Check the name and dose of your medication to make sure it is accurate. Check the number of pills, and the written instructions to make sure they are clear and accurate. Make sure that you are given enough medication to last until your next medication refill appointment. Taking Medication: Take medication as prescribed. When it comes to controlled substances, taking less pills or less frequently than prescribed is permitted and encouraged. Never take more pills than instructed. Never take the medication more frequently than prescribed.  Inform other Doctors: Always inform, all of your healthcare providers, of all the medications you take. Pain Medication from other Providers: You are not allowed to accept any additional pain medication from any other Doctor or Healthcare provider. There are two exceptions to this rule. (see below) In the event that you require additional pain medication, you are responsible for notifying us, as stated below. Cough Medicine: Often these contain an opioid, such as codeine or hydrocodone. Never accept or take cough medicine containing these opioids if you are already taking an opioid* medication. The combination may cause respiratory failure and death. Medication Agreement: You are responsible for carefully reading and following our Medication Agreement. This must be signed before receiving any prescriptions from our practice. Safely store a copy of your signed Agreement. Violations to the Agreement will result in no further prescriptions. (Additional copies of our Medication Agreement are available upon request.) Laws, Rules, & Regulations: All patients are  expected to follow all [address] and [employer], Rules, [employer]. Ignorance of the Laws does not constitute a valid excuse.  Illegal drugs and Controlled Substances: The use of illegal substances (including, but not limited to marijuana and its derivatives) and/or the illegal use of any controlled substances is strictly prohibited. Violation of this rule may result in the immediate and permanent discontinuation of any and all prescriptions being written by our practice. The use of any illegal substances is prohibited. Adopted CDC guidelines & recommendations: Target dosing levels will be at or below 60 MME/day. Use of benzodiazepines** is not recommended.  Exceptions: There are only two exceptions to the rule of not receiving pain medications from other Healthcare Providers. Exception #1 (Emergencies): In the event of an emergency (i.e.: accident requiring emergency care), you are allowed to receive additional pain medication. However, you are responsible for: As soon as you are able, call our office [phone], at any time of the day or night, and leave a message stating your name, the date and nature of the emergency, and the name and dose of the medication prescribed. In the event that your call is answered by a member  of our staff, make sure to document and save the date, time, and the name of the person that took your information.  Exception #2 (Planned Surgery): In the event that you are scheduled by another doctor or dentist to have any type of surgery or procedure, you are allowed (for a period no longer than 30 days), to receive additional pain medication, for the acute post-op pain. However, in this case, you are responsible for picking up a copy of our "Post-op Pain Management for Surgeons" handout, and giving it to your surgeon or dentist. This document is available at our office, and does not require an appointment to obtain it. Simply go to our office during business hours  (Monday-Thursday from 8:00 AM to 4:00 PM) (Friday 8:00 AM to 12:00 Noon) or if you have a scheduled appointment with [place], prior to your surgery, and ask for it by name. In addition, you are responsible for: calling our office [phone], at any time of the day or night, and leaving a message stating your name, name of your surgeon, type of surgery, and date of procedure or surgery. Failure to comply with your responsibilities may result in termination of therapy involving the controlled substances. Medication Agreement Violation. Following the above rules, including your responsibilities will help you in avoiding a Medication Agreement Violation ("Breaking your Pain Medication Contract").  Consequences:  Not following the above rules may result in permanent discontinuation of medication prescription therapy.  *Opioid medications include: morphine, codeine, oxycodone, oxymorphone, hydrocodone, hydromorphone, meperidine, tramadol, tapentadol, buprenorphine, fentanyl, methadone. **Benzodiazepine medications include: diazepam (Valium), alprazolam (Xanax), clonazepam (Klonopine), lorazepam (Ativan), clorazepate (Tranxene), chlordiazepoxide (Librium), estazolam (Prosom), oxazepam (Serax), temazepam (Restoril), triazolam (Halcion) (Last updated: 01/03/2022) ______________________________________________________________________    ______________________________________________________________________  Medication Recommendations and Reminders  Applies to: All patients receiving prescriptions (written and/or electronic).  Medication Rules & Regulations: You are responsible for reading, knowing, and following our "Medication Rules" document. These exist for your safety and that of others. They are not flexible and neither are we. Dismissing or ignoring them is an act of "non-compliance" that may result in complete and irreversible termination of such medication therapy. For safety reasons, "non-compliance"  will not be tolerated. As with the [place] fundamental legal principle of "ignorance of the law is no defense", we will accept no excuses for not having read and knowing the content of documents provided to you by our practice.  Pharmacy of record:  Definition: This is the pharmacy where your electronic prescriptions will be sent.  We do not endorse any particular pharmacy. It is up to you and your insurance to decide what pharmacy to use.  We do not restrict you in your choice of pharmacy. However, once we write for your prescriptions, we will NOT be re-sending more prescriptions to fix restricted supply problems created by your pharmacy, or your insurance.  The pharmacy listed in the electronic medical record should be the one where you want electronic prescriptions to be sent. If you choose to change pharmacy, simply notify our nursing staff. Changes will be made only during your regular appointments and not over the phone.  Recommendations: Keep all of your pain medications in a safe place, under lock and key, even if you live alone. We will NOT replace lost, stolen, or damaged medication. We do not accept "Police Reports" as proof of medications having been stolen. After you fill your prescription, take 1 week's worth of pills and put them away in a safe place. You should keep a separate,  properly labeled bottle for this purpose. The remainder should be kept in the original bottle. Use this as your primary supply, until it runs out. Once it's gone, then you know that you have 1 week's worth of medicine, and it is time to come in for a prescription refill. If you do this correctly, it is unlikely that you will ever run out of medicine. To make sure that the above recommendation works, it is very important that you make sure your medication refill appointments are scheduled at least 1 week before you run out of medicine. To do this in an effective manner, make sure that you do not leave the office  without scheduling your next medication management appointment. Always ask the nursing staff to show you in your prescription , when your medication will be running out. Then arrange for the receptionist to get you a return appointment, at least 7 days before you run out of medicine. Do not wait until you have 1 or 2 pills left, to come in. This is very poor planning and does not take into consideration that we may need to cancel appointments due to bad weather, sickness, or emergencies affecting our staff. DO NOT ACCEPT A "Partial Fill": If for any reason your pharmacy does not have enough pills/tablets to completely fill or refill your prescription, do not allow for a "partial fill". The law allows the pharmacy to complete that prescription within 72 hours, without requiring a new prescription. If they do not fill the rest of your prescription within those 72 hours, you will need a separate prescription to fill the remaining amount, which we will NOT provide. If the reason for the partial fill is your insurance, you will need to talk to the pharmacist about payment alternatives for the remaining tablets, but again, DO NOT ACCEPT A PARTIAL FILL, unless you can trust your pharmacist to obtain the remainder of the pills within 72 hours.  Prescription refills and/or changes in medication(s):  Prescription refills, and/or changes in dose or medication, will be conducted only during scheduled medication management appointments. (Applies to both, written and electronic prescriptions.) No refills on procedure days. No medication will be changed or started on procedure days. No changes, adjustments, and/or refills will be conducted on a procedure day. Doing so will interfere with the diagnostic portion of the procedure. No phone refills. No medications will be "called into the pharmacy". No Fax refills. No weekend refills. No Holliday refills. No after hours refills.  Remember:  Business hours are:  Monday to  Thursday 8:00 AM to 4:00 PM Provider's Schedule: Delano Metz, MD - Appointments are:  Medication management: Monday and Wednesday 8:00 AM to 4:00 PM Procedure day: Tuesday and Thursday 7:30 AM to 4:00 PM Edward Jolly, MD - Appointments are:  Medication management: Tuesday and Thursday 8:00 AM to 4:00 PM Procedure day: Monday and Wednesday 7:30 AM to 4:00 PM (Last update: 01/03/2022) ______________________________________________________________________   ____________________________________________________________________________________________  Naloxone Nasal Spray  Why am I receiving this medication? [place] STOP ACT requires that all patients taking high dose opioids or at risk of opioids respiratory depression, be prescribed an opioid reversal agent, such as Naloxone (AKA: Narcan).  What is this medication? NALOXONE (nal OX one) treats opioid overdose, which causes slow or shallow breathing, severe drowsiness, or trouble staying awake. Call emergency services after using this medication. You may need additional treatment. Naloxone works by reversing the effects of opioids. It belongs to a group of medications called opioid blockers.  COMMON BRAND NAME(S): Kloxxado, Narcan  What should I tell my care team before I take this medication? They need to know if you have any of these conditions: Heart disease Substance use disorder An unusual or allergic reaction to naloxone, other medications, foods, dyes, or preservatives Pregnant or trying to get pregnant Breast-feeding  When to use this medication? This medication is to be used for the treatment of respiratory depression (less than 8 breaths per minute) secondary to opioid overdose.   How to use this medication? This medication is for use in the nose. Lay the person on their back. Support their neck with your hand and allow the head to tilt back before giving the medication. The nasal spray should be given into 1  nostril. After giving the medication, move the person onto their side. Do not remove or test the nasal spray until ready to use. Get emergency medical help right away after giving the first dose of this medication, even if the person wakes up. You should be familiar with how to recognize the signs and symptoms of a narcotic overdose. If more doses are needed, give the additional dose in the other nostril. Talk to your care team about the use of this medication in children. While this medication may be prescribed for children as young as newborns for selected conditions, precautions do apply.  Naloxone Overdosage: If you think you have taken too much of this medicine contact a poison control center or emergency room at once.  NOTE: This medicine is only for you. Do not share this medicine with others.  What if I miss a dose? This does not apply.  What may interact with this medication? This is only used during an emergency. No interactions are expected during emergency use. This list may not describe all possible interactions. Give your health care provider a list of all the medicines, herbs, non-prescription drugs, or dietary supplements you use. Also tell them if you smoke, drink alcohol, or use illegal drugs. Some items may interact with your medicine.  What should I watch for while using this medication? Keep this medication ready for use in the case of an opioid overdose. Make sure that you have the phone number of your care team and local hospital ready. You may need to have additional doses of this medication. Each nasal spray contains a single dose. Some emergencies may require additional doses. After use, bring the treated person to the nearest hospital or call 911. Make sure the treating care team knows that the person has received a dose of this medication. You will receive additional instructions on what to do during and after use of this medication before an emergency occurs.  What  side effects may I notice from receiving this medication? Side effects that you should report to your care team as soon as possible: Allergic reactions--skin rash, itching, hives, swelling of the face, lips, tongue, or throat Side effects that usually do not require medical attention (report these to your care team if they continue or are bothersome): Constipation Dryness or irritation inside the nose Headache Increase in blood pressure Muscle spasms Stuffy nose Toothache This list may not describe all possible side effects. Call your doctor for medical advice about side effects. You may report side effects to FDA at 1-800-FDA-1088.  Where should I keep my medication? Because this is an emergency medication, you should keep it with you at all times.  Keep out of the reach of children and pets. Store between 20  and 25 degrees C (68 and 77 degrees F). Do not freeze. Throw away any unused medication after the expiration date. Keep in original box until ready to use.  NOTE: This sheet is a summary. It may not cover all possible information. If you have questions about this medicine, talk to your doctor, pharmacist, or health care provider.   2023 Elsevier/Gold Standard (2020-11-05 00:00:00)  ____________________________________________________________________________________________

## 2022-09-04 ENCOUNTER — Ambulatory Visit: Payer: Medicare Other

## 2022-09-04 ENCOUNTER — Telehealth: Payer: Self-pay | Admitting: Cardiology

## 2022-09-04 LAB — TOXASSURE SELECT 13 (MW), URINE

## 2022-09-04 NOTE — Telephone Encounter (Signed)
Pt c/o medication issue:  1. Name of Medication: flecainide (TAMBOCOR) 100 MG tablet   2. How are you currently taking this medication (dosage and times per day)?   Take 1 tablet (100 mg total) by mouth 2 (two) times daily.    3. Are you having a reaction (difficulty breathing--STAT)? No  4. What is your medication issue? Pt states that medication was causing her to have headaches and feet swelling to the point that it would leave her finger print. Pt says that she has not taken the last 3 doses of medication and issues have seemed to be resolved. Pt would like a callback regarding this matter

## 2022-09-05 ENCOUNTER — Ambulatory Visit: Payer: Medicare Other

## 2022-09-05 NOTE — Telephone Encounter (Signed)
Spoke with patient and informed her that she does not need to do the ETT today since she stopped the flecainide. Advised her to keep F/U appt already scheduled with Dr. Lalla Brothers to discuss further options.

## 2022-09-06 ENCOUNTER — Encounter: Payer: Self-pay | Admitting: Obstetrics and Gynecology

## 2022-09-06 ENCOUNTER — Ambulatory Visit (INDEPENDENT_AMBULATORY_CARE_PROVIDER_SITE_OTHER): Payer: Medicare Other | Admitting: Obstetrics and Gynecology

## 2022-09-06 VITALS — BP 121/83 | HR 69

## 2022-09-06 DIAGNOSIS — N393 Stress incontinence (female) (male): Secondary | ICD-10-CM | POA: Diagnosis not present

## 2022-09-06 DIAGNOSIS — R350 Frequency of micturition: Secondary | ICD-10-CM | POA: Diagnosis not present

## 2022-09-06 DIAGNOSIS — N8110 Cystocele, unspecified: Secondary | ICD-10-CM

## 2022-09-06 DIAGNOSIS — N816 Rectocele: Secondary | ICD-10-CM

## 2022-09-06 DIAGNOSIS — N993 Prolapse of vaginal vault after hysterectomy: Secondary | ICD-10-CM

## 2022-09-06 LAB — POCT URINALYSIS DIPSTICK
Bilirubin, UA: NEGATIVE
Glucose, UA: NEGATIVE
Ketones, UA: NEGATIVE
Nitrite, UA: NEGATIVE
Protein, UA: NEGATIVE
Spec Grav, UA: >=1.03 — AB (ref 1.010–1.025)
Urobilinogen, UA: 2 E.U./dL — AB
pH, UA: 5.5 (ref 5.0–8.0)

## 2022-09-06 MED ORDER — PHENAZOPYRIDINE HCL 95 MG PO TABS
95.0000 mg | ORAL_TABLET | Freq: Three times a day (TID) | ORAL | 1 refills | Status: DC | PRN
Start: 2022-09-06 — End: 2022-09-06

## 2022-09-06 MED ORDER — CEPHALEXIN 250 MG PO CAPS: 500.0000 mg | ORAL_CAPSULE | Freq: Two times a day (BID) | ORAL | 0 refills | Status: DC

## 2022-09-06 NOTE — Patient Instructions (Signed)
Taking Care of Yourself after Urodynamics Drink plenty of water for a day or two following your procedure. Try to have about 8 ounces (one cup) at a time, and do this 6 times or more per day unless you have fluid restrictitons AVOID irritative beverages such as coffee, tea, soda, alcoholic or citrus drinks for a day or two, as this may cause burning with urination. You may experience some discomfort or a burning sensation with urination after having this procedure. You can use over the counter Azo or pyridium to help with burning and follow the instructions on the packaging. If it does not improve within 1-2 days, or other symptoms appear (fever, chills, or difficulty urinating) call the office to speak to a nurse.  You may return to normal daily activities such as work, school, driving, exercising and housework on the day of the procedure. We have sent Pyridium to the pharmacy for bladder mucosa irritation.

## 2022-09-06 NOTE — Progress Notes (Signed)
[name] Urodynamics Procedure  Referring Physician: Erasmo Downer, MD Date of Procedure: 09/06/2022  [name] is a 62 y.o. female who presents for urodynamic evaluation. Indication(s) for study: SUI  Vital Signs: BP 121/83   Pulse 69   Laboratory Results: A clean catch urine specimen revealed:  POC urine: Positive for small blood and Trace Leukocytes.    Voiding Diary: Not Done  Procedure Timeout:  The correct patient was verified and the correct procedure was verified. The patient was in the correct position and safety precautions were reviewed based on at the patient's history.  Urodynamic Procedure A 245F dual lumen urodynamics catheter was placed under sterile conditions into the patient's bladder. A 245F catheter was placed into the rectum in order to measure abdominal pressure. EMG patches were placed in the appropriate position.  All connections were confirmed and calibrations/adjusted made. Saline was instilled into the bladder through the dual lumen catheters.  Cough/valsalva pressures were measured periodically during filling.  Patient was allowed to void.  The bladder was then emptied of its residual.  UROFLOW: Revealed a Qmax of 14.7 mL/sec.  She voided 84.2 mL and had a residual of 0 mL.  It was a normal pattern and represented normal habits though interpretation limited due to low voided volume.  CMG: This was performed with sterile water in the sitting position at a fill rate of 20-30 mL/min.    First sensation of fullness was 34 mLs,  First urge was 70 mLs,  Strong urge was 161 mLs and  Capacity was 192 mLs  Stress incontinence was not demonstrated Highest negative Barrier CLPP was 148 cmH20 at 269 ml. Highest negative Barrier VLPP was 126 cmH20 at 269 ml.  Detrusor function was normal, with no phasic contractions seen.    Compliance:  Normal. End fill detrusor pressure was 0cmH20.    UPP: MUCP with barrier reduction was 99  cm of water.    MICTURITION STUDY: Voiding was performed with reduction using scopettes in the sitting position.  Pdet at Qmax was 0 cm of water.  Qmax was 12.9 mL/sec.  It was a interrupted pattern.  She voided 192 mL and had a residual of 0 mL.  It was a volitional void, sustained detrusor contraction was present and abdominal straining was present  EMG: This was performed with patches.  She had voluntary contractions, recruitment with fill was present and urethral sphincter was not relaxed with void.  The details of the procedure with the study tracings have been scanned into EPIC.   Urodynamic Impression:  1. Sensation was increased; capacity was reduced 2. Stress Incontinence was not demonstrated at normal pressures; 3. Detrusor Overactivity was not demonstrated. 4. Emptying was dysfunctional with a normal PVR, a sustained detrusor contraction present,  abdominal straining present, dyssynergic urethral sphincter activity on EMG.  Plan: - The patient will follow up  to discuss the findings and treatment options.

## 2022-09-11 ENCOUNTER — Encounter: Payer: Medicare Other | Admitting: Pain Medicine

## 2022-09-18 DIAGNOSIS — Z96641 Presence of right artificial hip joint: Secondary | ICD-10-CM | POA: Diagnosis not present

## 2022-09-18 DIAGNOSIS — M25512 Pain in left shoulder: Secondary | ICD-10-CM | POA: Diagnosis not present

## 2022-09-18 DIAGNOSIS — R293 Abnormal posture: Secondary | ICD-10-CM | POA: Diagnosis not present

## 2022-09-18 DIAGNOSIS — G894 Chronic pain syndrome: Secondary | ICD-10-CM | POA: Diagnosis not present

## 2022-09-20 ENCOUNTER — Ambulatory Visit (INDEPENDENT_AMBULATORY_CARE_PROVIDER_SITE_OTHER): Payer: Medicare Other

## 2022-09-20 VITALS — Ht 63.0 in | Wt 154.0 lb

## 2022-09-20 DIAGNOSIS — Z0000 Encounter for general adult medical examination without abnormal findings: Secondary | ICD-10-CM | POA: Diagnosis not present

## 2022-09-20 NOTE — Patient Instructions (Addendum)
[name] , Thank you for taking time to come for your Medicare Wellness Visit. I appreciate your ongoing commitment to your health goals. Please review the following plan we discussed and let me know if I can assist you in the future.   These are the goals we discussed:  Goals       "I am dealing with so much stress with my son and grandchildren" (pt-stated)      Current Barriers:  Chronic Mental Health needs related to anxiety and depression Mental Health Concerns  Family and relationship dysfunction Lacks knowledge of community resource: community resources to assist with financial difficulties Suicidal Ideation/Homicidal Ideation: No  Clinical Social Work Goal(s):  Over the next  90 days, patient will work with SW bi-weekly by telephone or in person to reduce or manage symptoms of anxiety and depression related to her parenting responsibilities of her 3 grandchildren and dysfunctional relationship with her son   Interventions:  Continued to explore relationship with her son explored, which remains conflictual Explored progress of managing home schooling with her 3 grandchildren, with patient acknowledging no new improvements . Per patient, experience remains the same Followed up on information provided  on [place] [phone] www. [email or web address] and Grandparents Raising Grandchildren meets every 3rd Wednesday at 11 am at the [facility] [phone] Patient confirmed that she has not contacted the resources provided Patient encouraged to contact resources provided for added support Patient encouraged to utilized resources provided and to call this social worker with any questions or concerns regarding her mental health or community resource needs.   Patient Self Care Activities:  Performs ADL's independently Performs IADL's independently Ability for insight  Patient Coping Strengths:  Family Able to Communicate Effectively  Patient Self Care Deficits:  Difficulties in  establishing and maintaining boundaries with her adult son  Please see past updates related to this goal by clicking on the "Past Updates" button in the selected goal        Activity and Exercise Increased      Evidence-based guidance:  Review current exercise levels.  Assess patient perspective on exercise or activity level, barriers to increasing activity, motivation and readiness for change.  Recommend or set healthy exercise goal based on individual tolerance.  Encourage small steps toward making change in amount of exercise or activity.  Urge reduction of sedentary activities or screen time.  Promote group activities within the community or with family or support person.  Consider referral to rehabiliation therapist for assessment and exercise/activity plan.   Notes:       Increase water intake      Recommend increasing fluid intake to 4-6 glasses each day      LIFESTYLE - DECREASE FALLS RISK      Recommend to remove any items from the home that may cause slips or trips.        This is a list of the screening recommended for you and due dates:  Health Maintenance  Topic Date Due   DTaP/Tdap/Td vaccine (1 - Tdap) Never done   Zoster (Shingles) Vaccine (1 of 2) Never done   COVID-19 Vaccine (4 - 2023-24 season) 11/11/2021   Flu Shot  10/12/2022   Medicare Annual Wellness Visit  09/20/2023   Mammogram  02/02/2024   Colon Cancer Screening  01/16/2026   Hepatitis C Screening  Completed   HIV Screening  Completed   HPV Vaccine  Aged Out    Advanced directives: [id number] mailed to pt  Conditions/risks identified: moderate falls  risk  Next appointment: Follow up in one year for your annual wellness visit. 09/24/22 @ 2:30pm telephone  Preventive Care 40-64 Years, Female Preventive care refers to lifestyle choices and visits with your health care provider that can promote health and wellness. What does preventive care include? A yearly physical exam. This is also called  an annual well check. Dental exams once or twice a year. Routine eye exams. Ask your health care provider how often you should have your eyes checked. Personal lifestyle choices, including: Daily care of your teeth and gums. Regular physical activity. Eating a healthy diet. Avoiding tobacco and drug use. Limiting alcohol use. Practicing safe sex. Taking low-dose aspirin daily starting at age 6. Taking vitamin and mineral supplements as recommended by your health care provider. What happens during an annual well check? The services and screenings done by your health care provider during your annual well check will depend on your age, overall health, lifestyle risk factors, and family history of disease. Counseling  Your health care provider may ask you questions about your: Alcohol use. Tobacco use. Drug use. Emotional well-being. Home and relationship well-being. Sexual activity. Eating habits. Work and work Astronomer. Method of birth control. Menstrual cycle. Pregnancy history. Screening  You may have the following tests or measurements: Height, weight, and BMI. Blood pressure. Lipid and cholesterol levels. These may be checked every 5 years, or more frequently if you are over 68 years old. Skin check. Lung cancer screening. You may have this screening every year starting at age 80 if you have a 30-pack-year history of smoking and currently smoke or have quit within the past 15 years. Fecal occult blood test (FOBT) of the stool. You may have this test every year starting at age 43. Flexible sigmoidoscopy or colonoscopy. You may have a sigmoidoscopy every 5 years or a colonoscopy every 10 years starting at age 63. Hepatitis C blood test. Hepatitis B blood test. Sexually transmitted disease (STD) testing. Diabetes screening. This is done by checking your blood sugar (glucose) after you have not eaten for a while (fasting). You may have this done every 1-3 years. Mammogram.  This may be done every 1-2 years. Talk to your health care provider about when you should start having regular mammograms. This may depend on whether you have a family history of breast cancer. BRCA-related cancer screening. This may be done if you have a family history of breast, ovarian, tubal, or peritoneal cancers. Pelvic exam and Pap test. This may be done every 3 years starting at age 23. Starting at age 25, this may be done every 5 years if you have a Pap test in combination with an HPV test. Bone density scan. This is done to screen for osteoporosis. You may have this scan if you are at high risk for osteoporosis. Discuss your test results, treatment options, and if necessary, the need for more tests with your health care provider. Vaccines  Your health care provider may recommend certain vaccines, such as: Influenza vaccine. This is recommended every year. Tetanus, diphtheria, and acellular pertussis (Tdap, Td) vaccine. You may need a Td booster every 10 years. Zoster vaccine. You may need this after age 31. Pneumococcal 13-valent conjugate (PCV13) vaccine. You may need this if you have certain conditions and were not previously vaccinated. Pneumococcal polysaccharide (PPSV23) vaccine. You may need one or two doses if you smoke cigarettes or if you have certain conditions. Talk to your health care provider about which screenings and vaccines you need and  how often you need them. This information is not intended to replace advice given to you by your health care provider. Make sure you discuss any questions you have with your health care provider. Document Released: 03/26/2015 Document Revised: 11/17/2015 Document Reviewed: 12/29/2014 Elsevier Interactive Patient Education  2017 [employer].    Fall Prevention in the Home Falls can cause injuries. They can happen to people of all ages. There are many things you can do to make your home safe and to help prevent falls. What can I do on  the outside of my home? Regularly fix the edges of walkways and driveways and fix any cracks. Remove anything that might make you trip as you walk through a door, such as a raised step or threshold. Trim any bushes or trees on the path to your home. Use bright outdoor lighting. Clear any walking paths of anything that might make someone trip, such as rocks or tools. Regularly check to see if handrails are loose or broken. Make sure that both sides of any steps have handrails. Any raised decks and porches should have guardrails on the edges. Have any leaves, snow, or ice cleared regularly. Use sand or salt on walking paths during winter. Clean up any spills in your garage right away. This includes oil or grease spills. What can I do in the bathroom? Use night lights. Install grab bars by the toilet and in the tub and shower. Do not use towel bars as grab bars. Use non-skid mats or decals in the tub or shower. If you need to sit down in the shower, use a plastic, non-slip stool. Keep the floor dry. Clean up any water that spills on the floor as soon as it happens. Remove soap buildup in the tub or shower regularly. Attach bath mats securely with double-sided non-slip rug tape. Do not have throw rugs and other things on the floor that can make you trip. What can I do in the bedroom? Use night lights. Make sure that you have a light by your bed that is easy to reach. Do not use any sheets or blankets that are too big for your bed. They should not hang down onto the floor. Have a firm chair that has side arms. You can use this for support while you get dressed. Do not have throw rugs and other things on the floor that can make you trip. What can I do in the kitchen? Clean up any spills right away. Avoid walking on wet floors. Keep items that you use a lot in easy-to-reach places. If you need to reach something above you, use a strong step stool that has a grab bar. Keep electrical cords out  of the way. Do not use floor polish or wax that makes floors slippery. If you must use wax, use non-skid floor wax. Do not have throw rugs and other things on the floor that can make you trip. What can I do with my stairs? Do not leave any items on the stairs. Make sure that there are handrails on both sides of the stairs and use them. Fix handrails that are broken or loose. Make sure that handrails are as long as the stairways. Check any carpeting to make sure that it is firmly attached to the stairs. Fix any carpet that is loose or worn. Avoid having throw rugs at the top or bottom of the stairs. If you do have throw rugs, attach them to the floor with carpet tape. Make sure that  you have a light switch at the top of the stairs and the bottom of the stairs. If you do not have them, ask someone to add them for you. What else can I do to help prevent falls? Wear shoes that: Do not have high heels. Have rubber bottoms. Are comfortable and fit you well. Are closed at the toe. Do not wear sandals. If you use a stepladder: Make sure that it is fully opened. Do not climb a closed stepladder. Make sure that both sides of the stepladder are locked into place. Ask someone to hold it for you, if possible. Clearly mark and make sure that you can see: Any grab bars or handrails. First and last steps. Where the edge of each step is. Use tools that help you move around (mobility aids) if they are needed. These include: Canes. Walkers. Scooters. Crutches. Turn on the lights when you go into a dark area. Replace any light bulbs as soon as they burn out. Set up your furniture so you have a clear path. Avoid moving your furniture around. If any of your floors are uneven, fix them. If there are any pets around you, be aware of where they are. Review your medicines with your doctor. Some medicines can make you feel dizzy. This can increase your chance of falling. Ask your doctor what other things that  you can do to help prevent falls. This information is not intended to replace advice given to you by your health care provider. Make sure you discuss any questions you have with your health care provider. Document Released: 12/24/2008 Document Revised: 08/05/2015 Document Reviewed: 04/03/2014 Elsevier Interactive Patient Education  2017 [employer].

## 2022-09-20 NOTE — Progress Notes (Signed)
Subjective:   [name] is a 62 y.o. female who presents for Medicare Annual (Subsequent) preventive examination.  Visit Complete: Virtual  I connected with  [name] on 09/20/22 by a audio enabled telemedicine application and verified that I am speaking with the correct person using two identifiers.  Patient Location: Home  Provider Location: Office/Clinic  I discussed the limitations of evaluation and management by telemedicine. The patient expressed understanding and agreed to proceed.  Patient Medicare AWV questionnaire was completed by the patient on (;not done) I have confirmed that all information answered by patient is correct and no changes since this date.  Review of Systems    Cardiac Risk Factors include: advanced age (>31men, >53 women)    Objective:    Today's Vitals   09/20/22 1401  Weight: 154 lb (69.9 kg)  Height: 5\' 3"  (1.6 m)  PainSc: 4    Body mass index is 27.28 kg/m.     09/20/2022    2:10 PM 06/14/2022    1:55 PM 06/14/2022    1:53 PM 05/05/2022    1:38 PM 01/03/2022    5:30 PM 12/07/2021    7:06 AM 11/02/2021    1:25 PM  Advanced Directives  Does Patient Have a Medical Advance Directive? No No No No No No No  Would patient like information on creating a medical advance directive?  No - Patient declined No - Patient declined No - Patient declined   No - Patient declined    Current Medications (verified) Outpatient Encounter Medications as of 09/20/2022  Medication Sig   acetaminophen (TYLENOL) 325 MG tablet Take 2 tablets (650 mg total) by mouth every 6 (six) hours as needed for mild pain (or Fever >/= 101).   ALPRAZolam (XANAX) 1 MG tablet Take 1 mg by mouth at bedtime.    apixaban (ELIQUIS) 5 MG TABS tablet Take 1 tablet (5 mg total) by mouth 2 (two) times daily.   cephALEXin (KEFLEX) 250 MG capsule Take 2 capsules (500 mg total) by mouth 2 (two) times daily. (Patient not taking: Reported on 09/20/2022)   dexlansoprazole (DEXILANT) 60  MG capsule TAKE 1 CAPSULE BY MOUTH EVERY DAY   diazepam (VALIUM) 5 MG tablet Place 1 tablet vaginally nightly as needed for muscle spasm/ pelvic pain.   diltiazem (CARDIZEM CD) 240 MG 24 hr capsule Take 240 mg by mouth daily.   estradiol (ESTRACE VAGINAL) 0.1 MG/GM vaginal cream Place 1 Applicatorful vaginally 3 (three) times a week.   linaclotide (LINZESS) 290 MCG CAPS capsule TAKE 1 CAPSULE BY MOUTH DAILY BEFORE BREAKFAST.   montelukast (SINGULAIR) 10 MG tablet TAKE 1 TABLET BY MOUTH EVERY DAY   morphine (MSIR) 15 MG tablet Take 1 tablet (15 mg total) by mouth every 6 (six) hours as needed for moderate pain or severe pain. Must last 30 days.   [START ON 10/19/2022] morphine (MSIR) 15 MG tablet Take 1 tablet (15 mg total) by mouth every 6 (six) hours as needed for moderate pain or severe pain. Must last 30 days.   [START ON 11/18/2022] morphine (MSIR) 15 MG tablet Take 1 tablet (15 mg total) by mouth every 6 (six) hours as needed for moderate pain or severe pain. Must last 30 days.   naloxone (NARCAN) nasal spray 4 mg/0.1 mL Place 1 spray into the nose as needed for up to 365 doses (for opioid-induced respiratory depresssion). In case of emergency (overdose), spray once into each nostril. If no response within 3 minutes, repeat  application and call 911.   valACYclovir (VALTREX) 1000 MG tablet TAKE TWO TABLETS BY MOUTH TWICE DAILY FOR ONE DAY FEVER BLISTER   [DISCONTINUED] flecainide (TAMBOCOR) 100 MG tablet Take 1 tablet (100 mg total) by mouth 2 (two) times daily. (Patient not taking: Reported on 09/06/2022)   No facility-administered encounter medications on file as of 09/20/2022.    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    Chronic headaches    Chronic, continuous use of opioids    DDD (degenerative disc disease)    CERVICAL AND LUMBAR-CHRONIC PAIN, RT HIP LABRAL TEAR   Depression    PT STATES A LOT OF STRESS IN HER LIFE   Dissociative disorder    Dizziness 04/22/2013   Duodenal ulcer with hemorrhage and perforation (HCC) 04/27/2003   Foot drop, right    FROM BACK SURGERY   GERD (gastroesophageal reflux disease)    H/O arthrodesis 08/18/2014   Headache(784.0)    AND NECK PAIN--STATES RECENT TEST SHOW CERVICAL DEGENERATION   History of blood transfusion    s/p back surgery   History of cardiac cath    a. 05/2019 Cath: Nl cors. EF 55-65%.   History of cardioversion    History of cervical spinal surgery 01/04/2015   History of kidney stones    Hypertension    Inverted T wave    Iron deficiency 02/01/2021   Mitral regurgitation    a. 07/2020 Echo: EF 60-65%, no rwma. Nl RV size/fxn. RVSP 39.91mmHg. Mildly to mod dil LA. Mod MR; b. 07/2020 TEE: EF 55-60%. Lambl's excresence. Nl RV size/fxn. Midly dil LA. No LA/LAA thrombus. Mild MR.   Narrowing of intervertebral disc space 08/18/2014   Currently on disability.    Orthostatic hypotension 04/22/2013   Pain    CHRONIC NECK AND BACK PAIN - LIMITED ROM NECK - S/P FUSIONS CERVICAL AND LUMBAR   Paroxysmal atrial fibrillation (HCC) 2021   a. 07/2020 s/p PVI.   Pneumonia    PONV (postoperative nausea and vomiting)    PT GIVES HX OF N&V AND FEVER WITH SURGERIES YEARS AGO--BUT NO PROBLEMS WITH MORE RECENT SURGERIES--STATES NOT MALIGNANT  HYPERTHERMIA   Postop Hyponatremia 05/14/2012   Postoperative anemia due to acute blood loss 05/14/2012   PTSD (post-traumatic stress disorder)    Right foot drop    Right hip arthralgia 08/18/2014   Status post surgery of right, and now needs left.    Sleep apnea 2021   does not have a cpap   Therapeutic opioid-induced constipation (OIC)    Typical atrial flutter [facility])    Past Surgical History:  Procedure Laterality Date   ABDOMINAL HYSTERECTOMY     afib  05/2019   ANTERIOR CERVICAL DECOMP/DISCECTOMY FUSION N/A 10/30/2012   Procedure: ACDF C5-6, EXPLORATION AND HARDWARE REMOVAL C6-7;  Surgeon: Venita Lick, MD;  Location: [facility] OR;  Service: Orthopedics;  Laterality: N/A;   ANTERIOR FUSION CERVICAL SPINE  MAY 2012   AT [facility]   ATRIAL FIBRILLATION ABLATION N/A 08/06/2020   Procedure: ATRIAL FIBRILLATION ABLATION;  Surgeon: Lanier Prude, MD;  Location: [facility] INVASIVE CV LAB;  Service: Cardiovascular;  Laterality: N/A;   BACK SURGERY  2009   LUMBAR FUSION WITH RODS    BREAST BIOPSY Right 2008   benign.- bx/clip   BREAST EXCISIONAL BIOPSY Left 1998   benign   BREAST REDUCTION SURGERY Bilateral 06/2016   CARDIAC ELECTROPHYSIOLOGY MAPPING AND ABLATION     CARDIOVERSION N/A 12/03/2019   Procedure: CARDIOVERSION;  Surgeon: Debbe Odea, MD;  Location: [facility] ORS;  Service: Cardiovascular;  Laterality: N/A;   CARDIOVERSION N/A 08/01/2021   Procedure: CARDIOVERSION;  Surgeon: Debbe Odea, MD;  Location: [facility] ORS;  Service: Cardiovascular;  Laterality: N/A;   CARDIOVERSION N/A 08/05/2021   Procedure: CARDIOVERSION;  Surgeon: Antonieta Iba, MD;  Location: [facility] ORS;  Service: Cardiovascular;  Laterality: N/A;   CARPAL TUNNEL RELEASE  05-06-12   Right   CHOLECYSTECTOMY     COLONOSCOPY WITH PROPOFOL N/A 01/26/2017   Procedure: COLONOSCOPY WITH PROPOFOL;  Surgeon: Midge Minium, MD;  Location: [facility] SURGERY CNTR;  Service: Endoscopy;  Laterality: N/A;   COLONOSCOPY WITH PROPOFOL  N/A 01/16/2021   Procedure: COLONOSCOPY WITH PROPOFOL;  Surgeon: Toney Reil, MD;  Location: [facility] ENDOSCOPY;  Service: Gastroenterology;  Laterality: N/A;   DIAGNOSTIC LAPAROSCOPIES - MULTIPLE FOR ENDOMETRIOSIS     ESOPHAGEAL DILATION N/A 01/26/2017   Procedure: ESOPHAGEAL DILATION;  Surgeon: Midge Minium, MD;  Location: [facility] SURGERY [facility];  Service: Endoscopy;  Laterality: N/A;   ESOPHAGEAL MANOMETRY N/A 05/30/2017   Procedure: ESOPHAGEAL MANOMETRY (EM);  Surgeon: Midge Minium, MD;  Location: [facility] ENDOSCOPY;  Service: Endoscopy;  Laterality: N/A;   ESOPHAGOGASTRODUODENOSCOPY (EGD) WITH PROPOFOL N/A 01/26/2017   Procedure: ESOPHAGOGASTRODUODENOSCOPY (EGD) WITH PROPOFOL;  Surgeon: Midge Minium, MD;  Location: [facility];  Service: Endoscopy;  Laterality: N/A;   ESOPHAGOGASTRODUODENOSCOPY (EGD) WITH PROPOFOL N/A 01/30/2020   Procedure: ESOPHAGOGASTRODUODENOSCOPY (EGD) WITH PROPOFOL;  Surgeon: Midge Minium, MD;  Location: [facility] SURGERY CN[facility];  Service: Endoscopy;  Laterality: N/A;  sleep apnea COVID + 01-15-20   ESOPHAGOGASTRODUODENOSCOPY (EGD) WITH PROPOFOL N/A 01/15/2021   Procedure: ESOPHAGOGASTRODUODENOSCOPY (EGD) WITH PROPOFOL;  Surgeon: Toney Reil, MD;  Location: [facility] ENDOSCOPY;  Service: Gastroenterology;  Laterality: N/A;   ESOPHAGOGASTRODUODENOSCOPY (EGD) WITH PROPOFOL N/A 12/07/2021   Procedure: ESOPHAGOGASTRODUODENOSCOPY (EGD) WITH PROPOFOL;  Surgeon: Toney Reil, MD;  Location: [facility] ENDOSCOPY;  Service: Gastroenterology;  Laterality: N/A;   HIP ARTHROSCOPY  09/20/2011   Procedure: ARTHROSCOPY HIP;  Surgeon: Loanne Drilling, MD;  Location: [facility] ORS;  Service: Orthopedics;  Laterality: Right;  Right Hip Scope with Labral Debridement   LEFT HEART CATH AND CORONARY ANGIOGRAPHY Left 06/10/2019   Procedure: LEFT HEART CATH AND CORONARY ANGIOGRAPHY;  Surgeon: Yvonne Kendall, MD;  Location: [facility] INVASIVE CV LAB;  Service: Cardiovascular;  Laterality: Left;   LOWER  EXTREMITY ANGIOGRAPHY Left 01/06/2022   Procedure: Lower Extremity Angiography;  Surgeon: Renford Dills, MD;  Location: [facility] INVASIVE CV LAB;  Service: Cardiovascular;  Laterality: Left;   NASAL SEPTUM SURGERY  MARCH 2013   IN [place]   [facility] IMPEDANCE STUDY N/A 05/30/2017   Procedure: PH IMPEDANCE STUDY;  Surgeon: Midge Minium, MD;  Location: [facility] ENDOSCOPY;  Service: Endoscopy;  Laterality: N/A;   POLYPECTOMY N/A 01/26/2017   Procedure: POLYPECTOMY;  Surgeon: Midge Minium, MD;  Location[facility];  Service: Endoscopy;  Laterality: N/A;   POSTERIOR CERVICAL FUSION/FORAMINOTOMY N/A 04/16/2013   Procedure: REMOVAL CERVICAL PLATES AND INTERBODY CAGE/POSTERIOR CERVICAL SPINAL FUSION C4 - C6/C5 CORPECTOMY/C4 - C6 FUSION WITH ILIAC CREST BONE GRAFT;  Surgeon: Venita Lick, MD;  Location: [facility] OR;  Service: Orthopedics;  Laterality: N/A;   RADIOFREQUENCY ABLATION NERVES     REDUCTION MAMMAPLASTY Bilateral 05/2016   RIGHT HIP ARTHROSCOPY FOR LABRAL TEAR  ABOUT 2010   2012 also   SHOULDER ARTHROSCOPY  05-06-12   bone spur   TEE WITHOUT CARDIOVERSION N/A 08/05/2020   Procedure: TRANSESOPHAGEAL ECHOCARDIOGRAM (TEE);  Surgeon: Lewayne Bunting, MD;  Location: [facility] ENDOSCOPY;  Service: Cardiovascular;  Laterality: N/A;   TONSILLECTOMY     AS A CHILD   TOTAL HIP ARTHROPLASTY Right 05/13/2012   Procedure: TOTAL HIP ARTHROPLASTY ANTERIOR APPROACH;  Surgeon: Loanne Drilling, MD;  Location: [facility]S;  Service: Orthopedics;  Laterality: Right;   Family History  Problem Relation Age of Onset   Aneurysm Mother    Bipolar disorder Sister    Aneurysm Maternal Grandmother    Breast cancer Paternal Grandmother 82   Bipolar disorder Grandchild    Anxiety disorder Grandchild    Depression Grandchild    Cancer Neg Hx    Social History   Socioeconomic History   Marital status: Married    Spouse name: bruce   Number of children: 2   Years of education: Not on file   Highest education level:  Associate degree: occupational, Scientist, product/process development, or vocational program  Occupational History    Comment: disabled  Tobacco Use   Smoking status: Never   Smokeless tobacco: Never  Vaping Use   Vaping Use: Never used  Substance and Sexual Activity   Alcohol use: No    Alcohol/week: 0.0 standard drinks of alcohol   Drug use: Yes    Comment: prescribed morphine and xanax   Sexual activity: Not Currently  Other Topics Concern   Not on file  Social History Narrative   Son, daughter  And 4 grandkids; raises 3 of the grandchildren      Lives in [place]; with husband; never smoked; no alcohol; 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    Social Determinants of Health   Financial Resource Strain: Low Risk  (09/20/2022)   Overall Financial Resource Strain (CARDIA)    Difficulty of Paying Living Expenses: Not hard at all  Food Insecurity: No Food Insecurity (09/20/2022)   Hunger Vital Sign    Worried About Running Out of Food in the Last Year: Never true    Ran Out of Food in the Last Year: Never true  Transportation Needs: No Transportation Needs (09/20/2022)   PRAPARE - Administrator, Civil Service (Medical): No    Lack of Transportation (Non-Medical): No  Physical Activity: Insufficiently Active (09/20/2022)   Exercise Vital Sign    Days of Exercise per Week: 2 days    Minutes of Exercise per Session: 20 min  Stress: Stress Concern Present (09/20/2022)   [employer] of Occupational Health - Occupational Stress Questionnaire    Feeling of Stress : Rather much  Social Connections: Moderately Integrated (09/20/2022)   Social Connection and Isolation Panel [NHANES]    Frequency of Communication with Friends and Family: More than three times a week    Frequency of Social Gatherings with Friends and Family: Twice a week    Attends Religious Services: More than 4 times per year    Active Member of Clubs or Organizations: No  Attends Banker Meetings: Never    Marital Status:  Married    Tobacco Counseling Counseling given: Not Answered   Clinical Intake:  Pre-visit preparation completed: Yes  Pain : 0-10 Pain Score: 4  Pain Type: Chronic pain Pain Location: Generalized Pain Descriptors / Indicators: Aching Pain Onset: More than a month ago Pain Frequency: Constant Pain Relieving Factors: ben gay, heating pad,tylenol, morphine  Pain Relieving Factors: ben gay, heating pad,tylenol, morphine  BMI - recorded: 27.28 Nutritional Status: BMI 25 -29 Overweight Nutritional Risks: Nausea/ vomitting/ diarrhea (nausea with a-fib episodes) Diabetes: No  How often do you need to have someone help you when you read instructions, pamphlets, or other written materials from your doctor or pharmacy?: 1 - Never  Interpreter Needed?: No  Comments: lives with husband and 3 grands Information entered by :: B.Diarra Kos,LPN   Activities of Daily Living    09/20/2022    2:11 PM 06/19/2022   11:03 AM  In your present state of health, do you have any difficulty performing the following activities:  Hearing? 0 0  Vision? 0 0  Difficulty concentrating or making decisions? 0 0  Walking or climbing stairs? 1 1  Dressing or bathing? 0 0  Doing errands, shopping? 0 0  Preparing Food and eating ? N   Using the Toilet? N   In the past six months, have you accidently leaked urine? N   Do you have problems with loss of bowel control? N   Managing your Medications? N   Managing your Finances? N   Housekeeping or managing your Housekeeping? N     Patient Care Team: Erasmo Downer, MD as PCP - General (Family Medicine) Lanier Prude, MD as PCP - Electrophysiology (Cardiology) Debbe Odea, MD as PCP - Cardiology (Cardiology) Mila Palmer Benjaman Pott, NP as Nurse Practitioner (Psychiatry) Midge Minium, MD as Consulting Physician (Gastroenterology) Leafy Ro, MD as Consulting Physician (General Surgery) Delano Metz, MD as Referring Physician (Pain  Medicine) Pcp, No Sterling Big, DMD (Dentistry) Juanell Fairly, RN as Case Manager Earna Coder, MD as Consulting Physician (Internal Medicine) Anthonette Legato, OD (Optometry)  Indicate any recent Medical Services you may have received from other than [facility] providers in the past year (date may be approximate).     Assessment:   This is a routine wellness examination for Davida.  Hearing/Vision screen Vision Screening - Comments:: Adequate vision w/glasses [facility]  Dietary issues and exercise activities discussed:     Goals Addressed             This Visit's Progress    Activity and Exercise Increased   Not on track    Evidence-based guidance:  Review current exercise levels.  Assess patient perspective on exercise or activity level, barriers to increasing activity, motivation and readiness for change.  Recommend or set healthy exercise goal based on individual tolerance.  Encourage small steps toward making change in amount of exercise or activity.  Urge reduction of sedentary activities or screen time.  Promote group activities within the community or with family or support person.  Consider referral to rehabiliation therapist for assessment and exercise/activity plan.   Notes:      Increase water intake   On track    Recommend increasing fluid intake to 4-6 glasses each day     LIFESTYLE - DECREASE FALLS RISK   On track    Recommend to remove any items from the home that may cause slips or trips.  Depression Screen    09/20/2022    2:07 PM 06/19/2022   11:03 AM 06/14/2022    1:53 PM 03/03/2022    9:00 AM 01/20/2022   10:32 AM 03/24/2021   12:53 PM 02/08/2021    9:58 AM  PHQ 2/9 Scores  PHQ - 2 Score 1 0 0 0 0 0 0  PHQ- 9 Score  2  0 2  0    Fall Risk    09/20/2022    2:04 PM 08/30/2022    9:21 AM 06/19/2022   11:03 AM 06/14/2022    1:53 PM 06/06/2022   10:07 AM  Fall Risk   Falls in the past year? 1 0 1 1 0  Number falls in  past yr: 0  0 0 0  Injury with Fall? 1  0 1   Comment bruises   hurt left hip   Risk for fall due to : No Fall Risks  History of fall(s)  History of fall(s)  Follow up Education provided;Falls prevention discussed  Falls evaluation completed  Falls evaluation completed;Education provided    MEDICARE RISK AT HOME:  Medicare Risk at Home - 09/20/22 1404     Any stairs in or around the home? Yes    If so, are there any without handrails? Yes    Home free of loose throw rugs in walkways, pet beds, electrical cords, etc? Yes    Adequate lighting in your home to reduce risk of falls? Yes    Life alert? No    Use of a cane, walker or w/c? Yes   cane   Grab bars in the bathroom? No    Shower chair or bench in shower? Yes    Elevated toilet seat or a handicapped toilet? No             TIMED UP AND GO:  Was the test performed?  No    Cognitive Function:        09/20/2022    2:19 PM 01/20/2022   10:32 AM 01/18/2021   11:04 AM 12/07/2016   10:52 AM  6CIT Screen  What Year? 0 points 0 points 0 points 0 points  What month? 0 points 0 points 0 points 0 points  What time? 0 points 0 points 0 points 0 points  Count back from 20 0 points 0 points 0 points 0 points  Months in reverse 0 points 0 points 0 points 0 points  Repeat phrase 0 points 2 points 6 points 0 points  Total Score 0 points 2 points 6 points 0 points    Immunizations Immunization History  Administered Date(s) Administered   Influenza,inj,Quad PF,6+ Mos 12/30/2015, 12/07/2016, 12/25/2017, 12/27/2018, 01/13/2020, 01/16/2021, 01/20/2022   PFIZER(Purple Top)SARS-COV-2 Vaccination 06/06/2019, 07/04/2019, 12/22/2019   Pneumococcal Polysaccharide-23 12/30/2015    TDAP status: Up to date  Flu Vaccine status: Up to date  Pneumococcal vaccine status: Up to date  Covid-19 vaccine status: Completed vaccines  Qualifies for Shingles Vaccine? Yes   Zostavax completed No   Shingrix Completed?: No.    Education has been  provided regarding the importance of this vaccine. Patient has been advised to call insurance company to determine out of pocket expense if they have not yet received this vaccine. Advised may also receive vaccine at local pharmacy or Health Dept. Verbalized acceptance and understanding.  Screening Tests Health Maintenance  Topic Date Due   DTaP/Tdap/Td (1 - Tdap) Never done   Zoster Vaccines- Shingrix (1 of 2) Never done  COVID-19 Vaccine (4 - 2023-24 season) 11/11/2021   INFLUENZA VACCINE  10/12/2022   Medicare Annual Wellness (AWV)  09/20/2023   MAMMOGRAM  02/02/2024   Colonoscopy  01/16/2026   Hepatitis C Screening  Completed   HIV Screening  Completed   HPV VACCINES  Aged Out    Health Maintenance  Health Maintenance Due  Topic Date Due   DTaP/Tdap/Td (1 - Tdap) Never done   Zoster Vaccines- Shingrix (1 of 2) Never done   COVID-19 Vaccine (4 - 2023-24 season) 11/11/2021    Colorectal cancer screening: Type of screening: Colonoscopy. Completed yes. Repeat every 5 years  Mammogram status: Completed yes. Repeat every year  Lung Cancer Screening: (Low Dose CT Chest recommended if Age 32-80 years, 20 pack-year currently smoking OR have quit w/in 15years.) does not qualify.   Lung Cancer Screening Referral: no  Additional Screening:  Hepatitis C Screening: does not qualify; Completed yes  Vision Screening: Recommended annual ophthalmology exams for early detection of glaucoma and other disorders of the eye. Is the patient up to date with their annual eye exam?  Yes  Who is the provider or what is the name of the office in which the patient attends annual eye exams? [facility] If pt is not established with a provider, would they like to be referred to a provider to establish care? No .   Dental Screening: Recommended annual dental exams for proper oral hygiene  Diabetic Foot Exam: n/a  Community Resource Referral / Chronic Care Management: CRR required this visit?  No    CCM required this visit?  No    Plan:     I have personally reviewed and noted the following in the patient's chart:   Medical and social history Use of alcohol, tobacco or illicit drugs  Current medications and supplements including opioid prescriptions. Patient is currently taking opioid prescriptions. Information provided to patient regarding non-opioid alternatives. Patient advised to discuss non-opioid treatment plan with their provider. Functional ability and status Nutritional status Physical activity Advanced directives List of other physicians Hospitalizations, surgeries, and ER visits in previous 12 months Vitals Screenings to include cognitive, depression, and falls Referrals and appointments  In addition, I have reviewed and discussed with patient certain preventive protocols, quality metrics, and best practice recommendations. A written personalized care plan for preventive services as well as general preventive health recommendations were provided to patient.     Sue Lush, LPN   1/61/0960   After Visit Summary: (MyChart) Due to this being a telephonic visit, the after visit summary with patients personalized plan was offered to patient via MyChart   Nurse Notes: The patient hurriedly states she is doing alright. She reports having pain at all times and is taking opioids and ben-gay to manage. She relays she is having another cardiac ablation in August. She has no concerns or questions at this time.

## 2022-09-21 DIAGNOSIS — M25512 Pain in left shoulder: Secondary | ICD-10-CM | POA: Diagnosis not present

## 2022-09-21 DIAGNOSIS — R293 Abnormal posture: Secondary | ICD-10-CM | POA: Diagnosis not present

## 2022-09-21 DIAGNOSIS — Z96641 Presence of right artificial hip joint: Secondary | ICD-10-CM | POA: Diagnosis not present

## 2022-09-21 DIAGNOSIS — G894 Chronic pain syndrome: Secondary | ICD-10-CM | POA: Diagnosis not present

## 2022-09-25 ENCOUNTER — Ambulatory Visit: Payer: Medicare Other | Admitting: Cardiology

## 2022-09-25 ENCOUNTER — Ambulatory Visit
Admission: RE | Admit: 2022-09-25 | Discharge: 2022-09-25 | Disposition: A | Discharge status: Home / Self Care | Payer: Medicare Other | Source: Ambulatory Visit | Attending: Pain Medicine | Admitting: Pain Medicine

## 2022-09-25 DIAGNOSIS — G8929 Other chronic pain: Secondary | ICD-10-CM

## 2022-09-25 DIAGNOSIS — R936 Abnormal findings on diagnostic imaging of limbs: Secondary | ICD-10-CM

## 2022-09-25 DIAGNOSIS — M19011 Primary osteoarthritis, right shoulder: Secondary | ICD-10-CM

## 2022-09-25 DIAGNOSIS — M19012 Primary osteoarthritis, left shoulder: Secondary | ICD-10-CM

## 2022-09-25 DIAGNOSIS — S46812S Strain of other muscles, fascia and tendons at shoulder and upper arm level, left arm, sequela: Secondary | ICD-10-CM

## 2022-09-25 DIAGNOSIS — M778 Other enthesopathies, not elsewhere classified: Secondary | ICD-10-CM

## 2022-09-25 DIAGNOSIS — R2991 Unspecified symptoms and signs involving the musculoskeletal system: Secondary | ICD-10-CM

## 2022-09-27 ENCOUNTER — Ambulatory Visit: Payer: Medicare Other | Admitting: Cardiology

## 2022-10-09 ENCOUNTER — Ambulatory Visit: Payer: Medicare Other | Attending: Obstetrics and Gynecology | Admitting: Physical Therapy

## 2022-10-17 ENCOUNTER — Encounter: Payer: Self-pay | Admitting: Obstetrics and Gynecology

## 2022-10-17 ENCOUNTER — Ambulatory Visit: Payer: Medicare Other | Admitting: Obstetrics and Gynecology

## 2022-10-17 VITALS — BP 106/74 | HR 74

## 2022-10-17 DIAGNOSIS — N8110 Cystocele, unspecified: Secondary | ICD-10-CM | POA: Diagnosis not present

## 2022-10-17 DIAGNOSIS — R350 Frequency of micturition: Secondary | ICD-10-CM

## 2022-10-17 DIAGNOSIS — M62838 Other muscle spasm: Secondary | ICD-10-CM | POA: Diagnosis not present

## 2022-10-17 LAB — POCT URINALYSIS DIPSTICK
Bilirubin, UA: NEGATIVE
Glucose, UA: NEGATIVE
Ketones, UA: NEGATIVE
Leukocytes, UA: NEGATIVE
Nitrite, UA: NEGATIVE
Protein, UA: NEGATIVE
Spec Grav, UA: 1.01 (ref 1.010–1.025)
Urobilinogen, UA: 0.2 E.U./dL
pH, UA: 6 (ref 5.0–8.0)

## 2022-10-17 NOTE — Progress Notes (Signed)
[facility] Return Visit  SUBJECTIVE  History of Present Illness: [name] is a 62 y.o. female seen in follow-up for prolapse. She undwerent urodynamic testing.  Urodynamic Impression:  1. Sensation was increased; capacity was reduced 2. Stress Incontinence was not demonstrated at normal pressures; 3. Detrusor Overactivity was not demonstrated. 4. Emptying was dysfunctional with a normal PVR, a sustained detrusor contraction present,  abdominal straining present, dyssynergic urethral sphincter activity on EMG.  She does have some leakage at night. Only drinks a sip of water before bed. Has not tried medication for her bladder previously. Has some increased bladder irritation today, not sure if it is the start of a UTI.   She was given valium 5mg  to use prior to intercourse. Has not yet started pelvic PT. She did do PT for her shoulder and kept going into A.Fib. She has a cardiac ablation scheduled for 12/22/22.   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), Anemia, Anxiety, Asthma, Cephalalgia (08/18/2014), Cervical disc disease (08/18/2014), Chronic headaches, Chronic, continuous use of opioids, DDD (degenerative disc disease), Depression, Dissociative disorder, Dizziness (04/22/2013), Duodenal ulcer with hemorrhage and perforation (HCC) (04/27/2003), Foot drop, right, GERD (gastroesophageal reflux disease), H/O arthrodesis (08/18/2014), Headache(784.0), History of blood transfusion, History of cardiac cath, History of cardioversion, History of cervical spinal surgery (01/04/2015), History of kidney stones, Hypertension, Inverted T wave, Iron deficiency (02/01/2021), Mitral regurgitation, Narrowing of intervertebral disc space (08/18/2014), Orthostatic hypotension (04/22/2013), Pain, Paroxysmal atrial fibrillation (HCC) (2021), Pneumonia,  PONV (postoperative nausea and vomiting), Postop Hyponatremia (05/14/2012), Postoperative anemia due to acute blood loss (05/14/2012), PTSD (post-traumatic stress disorder), Right foot drop, Right hip arthralgia (08/18/2014), Sleep apnea (2021), Therapeutic opioid-induced constipation (OIC), and Typical atrial flutter (HCC).   Past Surgical History: She  has a past surgical history that includes RIGHT HIP ARTHROSCOPY FOR LABRAL TEAR (ABOUT 2010); Anterior fusion cervical spine (MAY 2012); Nasal septum surgery [facility] 2013); Back surgery (2009); Tonsillectomy; Abdominal hysterectomy; DIAGNOSTIC LAPAROSCOPIES - MULTIPLE FOR ENDOMETRIOSIS; Cholecystectomy; Hip arthroscopy (09/20/2011); Shoulder arthroscopy (05-06-12); Carpal tunnel release (05-06-12); Total hip arthroplasty (Right, 05/13/2012); Anterior cervical decomp/discectomy fusion (N/A, 10/30/2012); Posterior cervical fusion/foraminotomy (N/A, 04/16/2013); Breast reduction surgery (Bilateral, 06/2016); Colonoscopy with propofol (N/A, 01/26/2017); Esophagogastroduodenoscopy (egd) with propofol (N/A, 01/26/2017); Esophageal dilation (N/A, 01/26/2017); polypectomy (N/A, 01/26/2017); PH impedance study (N/A, 05/30/2017); Esophageal manometry (N/A, 05/30/2017); Radiofrequency ablation nerves; afib (05/2019); LEFT HEART CATH AND CORONARY ANGIOGRAPHY (Left, 06/10/2019); Cardioversion (N/A, 12/03/2019); Esophagogastroduodenoscopy (egd) with propofol (N/A, 01/30/2020); Reduction mammaplasty (Bilateral, 05/2016); Breast biopsy (Right, 2008); Breast excisional biopsy (Left, 1998); TEE without cardioversion (N/A, 08/05/2020); ATRIAL FIBRILLATION ABLATION (N/A, 08/06/2020); Cardiac electrophysiology mapping and ablation; Esophagogastroduodenoscopy (egd) with propofol (N/A, 01/15/2021); Colonoscopy with propofol (N/A, 01/16/2021); Cardioversion (N/A, 08/01/2021); Cardioversion (N/A, 08/05/2021); Esophagogastroduodenoscopy (egd) with propofol (N/A, 12/07/2021); and Lower Extremity Angiography  (Left, 01/06/2022).   Medications: She has a current medication list which includes the following prescription(s): acetaminophen, alprazolam, apixaban, cephalexin, dexlansoprazole, diazepam, diltiazem, estradiol, linzess, montelukast, morphine, [START ON 10/19/2022] morphine, [START ON 11/18/2022] morphine, naloxone, valacyclovir, and [DISCONTINUED] flecainide.   Allergies: Patient is allergic to amoxicillin, chlorhexidine gluconate, clindamycin, codeine, erythromycin, penicillin g, sulfa antibiotics, levofloxacin, shellfish allergy, decadron [dexamethasone], flecainide, mangifera indica, papaya derivatives, betadine [povidone iodine], clarithromycin, other, povidone-iodine, and prednisone.   Social History: Patient  reports that she has never smoked. She has never used smokeless tobacco. She reports current drug use. She reports that she does not drink  alcohol.      OBJECTIVE     Physical Exam: Vitals:   10/17/22 1323  BP: 106/74  Pulse: 74   Gen: No apparent distress, A&O x 3.  Detailed Urogynecologic Evaluation:  Normal external genitalia. On speculum, normal vaginal mucosa. On bimanual, no masses palpated. On palpation of pelvic floor, muscle spasm noted R> L, and this reproduced pressure sensation.   POP-Q  -1                                            Aa   -1                                           Ba  -6                                              C   3                                            Gh  3.5                                            Pb  7.5                                            tvl   -2                                            Ap  -2                                            Bp                                                 D      ASSESSMENT AND PLAN    Ms. Lowenstein is a 62 y.o. with:  1. Levator spasm   2. Urinary frequency   3. Prolapse of anterior vaginal wall    - Levator spasm is likely the cause of pressure sensation in the  vagina. Has some prolapse but it is not significant. Would recommend undergoing PT first before deciding on surgery.  - Will Resend PT referral when patient is ready- can call or message in my chart.  - We discussed that if she does end up wanting surgery- would recommend anterior repair, possible posterior repair  Marguerita Beards, MD  Time spent: I spent 25 minutes dedicated to the care of this patient on the date of this encounter to include pre-visit review of records, face-to-face time with the patient and post visit documentation.

## 2022-10-19 ENCOUNTER — Ambulatory Visit: Payer: Medicare Other | Admitting: Physical Therapy

## 2022-10-20 ENCOUNTER — Other Ambulatory Visit: Payer: Self-pay | Admitting: Family Medicine

## 2022-10-21 ENCOUNTER — Other Ambulatory Visit: Payer: Self-pay | Admitting: Obstetrics and Gynecology

## 2022-10-23 ENCOUNTER — Other Ambulatory Visit: Payer: Self-pay

## 2022-10-24 ENCOUNTER — Encounter: Payer: Self-pay | Admitting: Cardiology

## 2022-10-24 ENCOUNTER — Ambulatory Visit: Payer: Medicare Other | Attending: Cardiology | Admitting: Cardiology

## 2022-10-24 VITALS — BP 118/78 | HR 71 | Ht 63.0 in | Wt 151.0 lb

## 2022-10-24 DIAGNOSIS — I10 Essential (primary) hypertension: Secondary | ICD-10-CM | POA: Diagnosis not present

## 2022-10-24 DIAGNOSIS — I4819 Other persistent atrial fibrillation: Secondary | ICD-10-CM

## 2022-10-24 NOTE — Progress Notes (Signed)
Electrophysiology Office Follow up Visit Note:    Date:  10/24/2022   ID:  [name], DOB 23-Jul-1960, MRN [id number]  PCP:  Erasmo Downer, MD  [facility] Cardiologist:  Debbe Odea, MD  [facility] Electrophysiologist:  Lanier Prude, MD    Interval History:    [name] is a 62 y.o. female who presents for a follow up visit.   Last seen August 23, 2022 by [name].  The patient had an A-fib ablation on Aug 06, 2020 during which the pulmonary veins, posterior wall and CTI were ablated. In June 2023 she had a redo ablation at [facility] utilizing cryoenergy.  During that procedure the veins and posterior wall are really isolated.  A mitral isthmus line was added with radiofrequency ablation.  A ZIO monitor ordered in April 2024 showed a 22% burden of atrial fibrillation.  She is on Eliquis twice daily. At the previous appointment with [name], repeat catheter ablation and surgical ablation were discussed.    She has never tried Therapist, occupational.      Past medical, surgical, social and family history were reviewed.  ROS:   Please see the history of present illness.    All other systems reviewed and are negative.  EKGs/Labs/Other Studies Reviewed:    The following studies were reviewed today:    EKG Interpretation Date/Time:  Tuesday October 24 2022 09:58:43 EDT Ventricular Rate:  71 PR Interval:  138 QRS Duration:  80 QT Interval:  388 QTC Calculation: 421 R Axis:   4  Text Interpretation: Normal sinus rhythm Minimal voltage criteria for LVH, may be normal variant ( R in aVL ) Confirmed by [name] [phone]) on 10/24/2022 10:04:43 AM    Physical Exam:    VS:  BP 118/78 (BP Location: Left Arm, Patient Position: Sitting, Cuff Size: Normal)   Pulse 71   Ht 5\' 3"  (1.6 m)   Wt 151 lb (68.5 kg)   BMI 26.75 kg/m     Wt Readings from Last 3 Encounters:  10/24/22 151 lb (68.5 kg)  09/20/22 154 lb (69.9 kg)  08/30/22 154 lb (69.9 kg)      GEN:  Well nourished, well developed in no acute distress CARDIAC: RRR, no murmurs, rubs, gallops RESPIRATORY:  Clear to auscultation without rales, wheezing or rhonchi       ASSESSMENT:    1. Persistent atrial fibrillation (HCC)   2. Primary hypertension    PLAN:    In order of problems listed above:  #Persistent atrial fibrillation and flutter With 2 prior ablations.  Previous ablation lesion sets included the pulmonary veins, posterior wall, mitral isthmus line, cavotricuspid isthmus line. Continue Eliquis for stroke prophylaxis.  I did discuss treatment options again with the patient during today's visit.  I discussed surgical A-fib ablation and redo catheter ablation.  I do think there is utility in a repeat catheter ablation attempt given her veins and posterior wall have proven difficult to isolate.  If, during repeat catheter ablation the posterior wall and pulmonary veins were still silent, would recommend referral for surgical ablation.  If there are areas to touchup/reisolate, we have a good chance to achieve improved rhythm control with catheter ablation.  Discussed treatment options today for AF including antiarrhythmic drug therapy and ablation. Discussed risks, recovery and likelihood of success with each treatment strategy. Risk, benefits, and alternatives to EP study and ablation for afib were discussed. These risks include but are not limited to stroke, bleeding, vascular damage,  tamponade, perforation, damage to the esophagus, lungs, phrenic nerve and other structures, pulmonary vein stenosis, worsening renal function, coronary vasospasm and death.  Discussed potential need for repeat ablation procedures and antiarrhythmic drugs after an initial ablation. The patient understands these risk and wishes to proceed.  We will therefore proceed with catheter ablation at the next available time.  Carto, ICE, anesthesia are requested for the procedure.  Will also obtain CT PV  protocol prior to the procedure to exclude LAA thrombus and further evaluate atrial anatomy.  We discussed Tikosyn as well during today's appointment.  If she were to have a recurrence after a third time ablation or if there are few targets during the third ablation, favor initiating Tikosyn.  QTc stable on today's EKG.  #Hypertension At goal today.  Recommend checking blood pressures 1-2 times per week at home and recording the values.  Recommend bringing these recordings to the primary care physician.      Signed, Shelby Dunn, MD, [facility], [facility] 10/24/2022 10:18 AM    Electrophysiology [facility]

## 2022-10-24 NOTE — Patient Instructions (Signed)
Medication Instructions:  Your physician recommends that you continue on your current medications as directed. Please refer to the Current Medication list given to you today.  *If you need a refill on your cardiac medications before your next appointment, please call your pharmacy   Follow-Up: At [facility], you and your health needs are our priority.  As part of our continuing mission to provide you with exceptional heart care, we have created designated Provider Care Teams.  These Care Teams include your primary Cardiologist (physician) and Advanced Practice Providers (APPs -  Physician Assistants and Nurse Practitioners) who all work together to provide you with the care you need, when you need it.  Your next appointment:   As scheduled  

## 2022-10-26 ENCOUNTER — Encounter: Payer: Medicare Other | Admitting: Physical Therapy

## 2022-10-31 ENCOUNTER — Ambulatory Visit: Payer: Medicare Other | Admitting: Gastroenterology

## 2022-11-02 ENCOUNTER — Encounter: Payer: Medicare Other | Admitting: Physical Therapy

## 2022-11-03 ENCOUNTER — Inpatient Hospital Stay: Payer: Medicare Other | Attending: Internal Medicine

## 2022-11-03 ENCOUNTER — Inpatient Hospital Stay: Payer: Medicare Other

## 2022-11-03 ENCOUNTER — Inpatient Hospital Stay: Payer: Medicare Other | Admitting: Internal Medicine

## 2022-11-03 ENCOUNTER — Encounter: Payer: Self-pay | Admitting: Internal Medicine

## 2022-11-03 VITALS — BP 117/76 | HR 74 | Temp 98.2°F | Ht 63.0 in | Wt 152.4 lb

## 2022-11-03 DIAGNOSIS — D509 Iron deficiency anemia, unspecified: Secondary | ICD-10-CM | POA: Insufficient documentation

## 2022-11-03 DIAGNOSIS — E611 Iron deficiency: Secondary | ICD-10-CM

## 2022-11-03 LAB — CBC WITH DIFFERENTIAL (CANCER CENTER ONLY)
Abs Immature Granulocytes: 0.02 10*3/uL (ref 0.00–0.07)
Basophils Absolute: 0.1 10*3/uL (ref 0.0–0.1)
Basophils Relative: 1 %
Eosinophils Absolute: 0.1 10*3/uL (ref 0.0–0.5)
Eosinophils Relative: 1 %
HCT: 38.1 % (ref 36.0–46.0)
Hemoglobin: 12.7 g/dL (ref 12.0–15.0)
Immature Granulocytes: 0 %
Lymphocytes Relative: 24 %
Lymphs Abs: 1.4 10*3/uL (ref 0.7–4.0)
MCH: 28.8 pg (ref 26.0–34.0)
MCHC: 33.3 g/dL (ref 30.0–36.0)
MCV: 86.4 fL (ref 80.0–100.0)
Monocytes Absolute: 0.3 10*3/uL (ref 0.1–1.0)
Monocytes Relative: 5 %
Neutro Abs: 4 10*3/uL (ref 1.7–7.7)
Neutrophils Relative %: 69 %
Platelet Count: 360 10*3/uL (ref 150–400)
RBC: 4.41 MIL/uL (ref 3.87–5.11)
RDW: 12.7 % (ref 11.5–15.5)
WBC Count: 5.9 10*3/uL (ref 4.0–10.5)
nRBC: 0 % (ref 0.0–0.2)

## 2022-11-03 LAB — IRON AND TIBC
Iron: 72 ug/dL (ref 28–170)
Saturation Ratios: 18 % (ref 10.4–31.8)
TIBC: 409 ug/dL (ref 250–450)
UIBC: 337 ug/dL

## 2022-11-03 LAB — BASIC METABOLIC PANEL - CANCER CENTER ONLY
Anion gap: 8 (ref 5–15)
BUN: 10 mg/dL (ref 8–23)
CO2: 23 mmol/L (ref 22–32)
Calcium: 8.7 mg/dL — ABNORMAL LOW (ref 8.9–10.3)
Chloride: 105 mmol/L (ref 98–111)
Creatinine: 0.83 mg/dL (ref 0.44–1.00)
GFR, Estimated: >60 mL/min (ref >60)
Glucose, Bld: 153 mg/dL — ABNORMAL HIGH (ref 70–99)
Potassium: 3.9 mmol/L (ref 3.5–5.1)
Sodium: 136 mmol/L (ref 135–145)

## 2022-11-03 LAB — FERRITIN: Ferritin: 20 ng/mL (ref 11–307)

## 2022-11-03 MED ORDER — SODIUM CHLORIDE 0.9 % IV SOLN
Freq: Once | INTRAVENOUS | Status: AC
  Filled 2022-11-03: qty 250

## 2022-11-03 MED ORDER — SODIUM CHLORIDE 0.9 % IV SOLN
200.0000 mg | Freq: Once | INTRAVENOUS | Status: AC
  Administered 2022-11-03: 200 mg via INTRAVENOUS
  Filled 2022-11-03: qty 10
  Filled 2022-11-03: qty 200

## 2022-11-03 NOTE — Progress Notes (Signed)
Fatigue/weakness: yes Dyspena: no Light headedness: occasionally Blood in stool: no  Heart ablation 10/24.

## 2022-11-03 NOTE — Progress Notes (Signed)
[facility] CONSULT NOTE  Patient Care Team: Beryle Flock Marzella Schlein, MD as PCP - General (Family Medicine) Lanier Prude, MD as PCP - Electrophysiology (Cardiology) Debbe Odea, MD as PCP - Cardiology (Cardiology) Mila Palmer Benjaman Pott, NP as Nurse Practitioner (Psychiatry) Midge Minium, MD as Consulting Physician (Gastroenterology) Leafy Ro, MD as Consulting Physician (General Surgery) Delano Metz, MD as Referring Physician (Pain Medicine) Pcp, No Sterling Big, DMD (Dentistry) Earna Coder, MD as Consulting Physician (Internal Medicine) Anthonette Legato, OD (Optometry)  CHIEF COMPLAINTS/PURPOSE OF CONSULTATION: ANEMIA  HEMATOLOGY HISTORY:  #Iron deficiency ANEMIA EGD-gastritis/ colonoscopy- lymphoplasmacytic infiltrateNovember 2022 [Dr. Allegra Lai; in hospital]; capsule/Bone marrow Biopsy-none; [history of chronic iron deficiency anemia- s/p IV iron infusions in the past]; CTA CAP: Negative for any source of bleeding noted.  Atrophic pancreas-?  Etiology;   #Question pancreatic insufficiency-on Creon [nov 2022]  #A. Fib [s/p cardioversion-Eliquis Dr.Agbor]; #Chronic back pain/pain management    Latest Reference Range & Units 01/14/21 06:15  Iron 28 - 170 ug/dL 30  UIBC ug/dL 235  TIBC 573 - 220 ug/dL 254 (H)  Saturation Ratios 10.4 - 31.8 % 6 (L)  Ferritin 11 - 307 ng/mL 7 (L)  Folate >5.9 ng/mL 14.9    HISTORY OF PRESENTING ILLNESS: Alone ambulating independently.  [name] 62 y.o.  female iron deficient anemia unclear etiology- possible Hx of Interstitial cystis; Hx of A-fib on Eliquis  here for follow-up.  Patient is feeling very tired all the time. Reports multiple hospital admissions since last visit with A-fib. S/p ablation; again third one in October, 2024.    NO blood in stools. Chronic intermittent blood in urine.   Gaining weight. Patient not on oral iron because of intolerance.  Complains of  constipation/diarrhea-followed by GI.   Review of Systems  Constitutional:  Positive for malaise/fatigue. Negative for chills, diaphoresis, fever and weight loss.  HENT:  Negative for nosebleeds and sore throat.   Eyes:  Negative for double vision.  Respiratory:  Negative for cough, hemoptysis, sputum production, shortness of breath and wheezing.   Cardiovascular:  Negative for chest pain, palpitations, orthopnea and leg swelling.  Gastrointestinal:  Positive for diarrhea and nausea. Negative for abdominal pain, blood in stool, constipation, heartburn, melena and vomiting.  Genitourinary:  Negative for dysuria, frequency and urgency.  Musculoskeletal:  Negative for back pain and joint pain.  Skin: Negative.  Negative for itching and rash.  Neurological:  Negative for dizziness, tingling, focal weakness, weakness and headaches.  Endo/Heme/Allergies:  Does not bruise/bleed easily.  Psychiatric/Behavioral:  Negative for depression. The patient is not nervous/anxious and does not have insomnia.     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    Chronic headaches    Chronic, continuous use of opioids    DDD (degenerative disc disease)    CERVICAL AND LUMBAR-CHRONIC PAIN, RT HIP LABRAL TEAR   Depression    PT STATES A LOT OF STRESS IN HER LIFE   Dissociative disorder    Dizziness 04/22/2013   Duodenal ulcer  with hemorrhage and perforation (HCC) 04/27/2003   Foot drop, right    FROM BACK SURGERY   GERD (gastroesophageal reflux disease)    H/O arthrodesis 08/18/2014   Headache(784.0)    AND NECK  PAIN--STATES RECENT TEST SHOW CERVICAL DEGENERATION   History of blood transfusion    s/p back surgery   History of cardiac cath    a. 05/2019 Cath: Nl cors. EF 55-65%.   History of cardioversion    History of cervical spinal surgery 01/04/2015   History of kidney stones    Hypertension    Inverted T wave    Iron deficiency 02/01/2021   Mitral regurgitation    a. 07/2020 Echo: EF 60-65%, no rwma. Nl RV size/fxn. RVSP 39.70mmHg. Mildly to mod dil LA. Mod MR; b. 07/2020 TEE: EF 55-60%. Lambl's excresence. Nl RV size/fxn. Midly dil LA. No LA/LAA thrombus. Mild MR.   Narrowing of intervertebral disc space 08/18/2014   Currently on disability.    Orthostatic hypotension 04/22/2013   Pain    CHRONIC NECK AND BACK PAIN - LIMITED ROM NECK - S/P FUSIONS CERVICAL AND LUMBAR   Paroxysmal atrial fibrillation (HCC) 2021   a. 07/2020 s/p PVI.   Pneumonia    PONV (postoperative nausea and vomiting)    PT GIVES HX OF N&V AND FEVER WITH SURGERIES YEARS AGO--BUT NO PROBLEMS WITH MORE RECENT SURGERIES--STATES NOT MALIGNANT HYPERTHERMIA   Postop Hyponatremia 05/14/2012   Postoperative anemia due to acute blood loss 05/14/2012   PTSD (post-traumatic stress disorder)    Right foot drop    Right hip arthralgia 08/18/2014   Status post surgery of right, and now needs left.    Sleep apnea 2021   does not have a cpap   Therapeutic opioid-induced constipation (OIC)    Typical atrial flutter (HCC)     SURGICAL HISTORY: Past Surgical History:  Procedure Laterality Date   ABDOMINAL HYSTERECTOMY     afib  05/2019   ANTERIOR CERVICAL DECOMP/DISCECTOMY FUSION N/A 10/30/2012   Procedure: ACDF C5-6, EXPLORATION AND HARDWARE REMOVAL C6-7;  Surgeon: Venita Lick, MD;  Location: [facility] OR;  Service: Orthopedics;  Laterality: N/A;   ANTERIOR FUSION CERVICAL SPINE  MAY 2012   AT [facility]   ATRIAL FIBRILLATION ABLATION N/A 08/06/2020   Procedure: ATRIAL FIBRILLATION ABLATION;  Surgeon: Lanier Prude, MD;  Location: [facility]  INVASIVE CV LAB;  Service: Cardiovascular;  Laterality: N/A;   BACK SURGERY  2009   LUMBAR FUSION WITH RODS    BREAST BIOPSY Right 2008   benign.- bx/clip   BREAST EXCISIONAL BIOPSY Left 1998   benign   BREAST REDUCTION SURGERY Bilateral 06/2016   CARDIAC ELECTROPHYSIOLOGY MAPPING AND ABLATION     CARDIOVERSION N/A 12/03/2019   Procedure: CARDIOVERSION;  Surgeon: Debbe Odea, MD;  Location: [facility] ORS;  Service: Cardiovascular;  Laterality: N/A;   CARDIOVERSION N/A 08/01/2021   Procedure: CARDIOVERSION;  Surgeon: Debbe Odea, MD;  Location: [facility] ORS;  Service: Cardiovascular;  Laterality: N/A;   CARDIOVERSION N/A 08/05/2021   Procedure: CARDIOVERSION;  Surgeon: Antonieta Iba, MD;  Location: [facility] ORS;  Service: Cardiovascular;  Laterality: N/A;   CARPAL TUNNEL RELEASE  05-06-12   Right   CHOLECYSTECTOMY     COLONOSCOPY WITH PROPOFOL N/A 01/26/2017   Procedure: COLONOSCOPY WITH PROPOFOL;  Surgeon: Midge Minium, MD;  Location: [facility];  Service: Endoscopy;  Laterality: N/A;   COLONOSCOPY WITH PROPOFOL N/A 01/16/2021   Procedure: COLONOSCOPY WITH PROPOFOL;  Surgeon: Toney Reil, MD;  Location: [facility]  ENDOSCOPY;  Service: Gastroenterology;  Laterality: N/A;   DIAGNOSTIC LAPAROSCOPIES - MULTIPLE FOR ENDOMETRIOSIS     ESOPHAGEAL DILATION N/A 01/26/2017   Procedure: ESOPHAGEAL DILATION;  Surgeon: Midge Minium, MD;  Location: [facility];  Service: Endoscopy;  Laterality: N/A;   ESOPHAGEAL MANOMETRY N/A 05/30/2017   Procedure: ESOPHAGEAL MANOMETRY (EM);  Surgeon: Midge Minium, MD;  Location: [facility] ENDOSCOPY;  Service: Endoscopy;  Laterality: N/A;   ESOPHAGOGASTRODUODENOSCOPY (EGD) WITH PROPOFOL N/A 01/26/2017   Procedure: ESOPHAGOGASTRODUODENOSCOPY (EGD) WITH PROPOFOL;  Surgeon: Midge Minium, MD;  Location: [facility]GERY [facility];  Service: Endoscopy;  Laterality: N/A;   ESOPHAGOGASTRODUODENOSCOPY (EGD) WITH PROPOFOL N/A 01/30/2020   Procedure:  ESOPHAGOGASTRODUODENOSCOPY (EGD) WITH PROPOFOL;  Surgeon: Midge Minium, MD;  Location: [facility] SUR[facility]ERY [facility];  Service: Endoscopy;  Laterality: N/A;  sleep apnea COVID + 01-15-20   ESOPHAGOGASTRODUODENOSCOPY (EGD) WITH PROPOFOL N/A 01/15/2021   Procedure: ESOPHAGOGASTRODUODENOSCOPY (EGD) WITH PROPOFOL;  Surgeon: Toney Reil, MD;  Location: [facility] ENDOSCOPY;  Service: Gastroenterology;  Laterality: N/A;   ESOPHAGOGASTRODUODENOSCOPY (EGD) WITH PROPOFOL N/A 12/07/2021   Procedure: ESOPHAGOGASTRODUODENOSCOPY (EGD) WITH PROPOFOL;  Surgeon: Toney Reil, MD;  Location: [facility] ENDOSCOPY;  Service: Gastroenterology;  Laterality: N/A;   HIP ARTHROSCOPY  09/20/2011   Procedure: ARTHROSCOPY HIP;  Surgeon: Loanne Drilling, MD;  Location: [facility] ORS;  Service: Orthopedics;  Laterality: Right;  Right Hip Scope with Labral Debridement   LEFT HEART CATH AND CORONARY ANGIOGRAPHY Left 06/10/2019   Procedure: LEFT HEART CATH AND CORONARY ANGIOGRAPHY;  Surgeon: Yvonne Kendall, MD;  Location: [facility] INVASIVE CV LAB;  Service: Cardiovascular;  Laterality: Left;   LOWER EXTREMITY ANGIOGRAPHY Left 01/06/2022   Procedure: Lower Extremity Angiography;  Surgeon: Renford Dills, MD;  Location: [facility] INVASIVE CV LAB;  Service: Cardiovascular;  Laterality: Left;   NASAL SEPTUM SURGERY  MARCH 2013   IN [place]   [facility] IMPEDANCE STUDY N/A 05/30/2017   Procedure: PH IMPEDANCE STUDY;  Surgeon: Midge Minium, MD;  Location: [facility] ENDOSCOPY;  Service: Endoscopy;  Laterality: N/A;   POLYPECTOMY N/A 01/26/2017   Procedure: POLYPECTOMY;  Surgeon: Midge Minium, MD;  Location: [facility] SURGERY [facility]TR;  Service: Endoscopy;  Laterality: N/A;   POSTERIOR CERVICAL FUSION/FORAMINOTOMY N/A 04/16/2013   Procedure: REMOVAL CERVICAL PLATES AND INTERBODY CAGE/POSTERIOR CERVICAL SPINAL FUSION C4 - C6/C5 CORPECTOMY/C4 - C6 FUSION WITH ILIAC CREST BONE GRAFT;  Surgeon: Venita Lick, MD;  Location: [facility] OR;  Service: Orthopedics;  Laterality: N/A;    RADIOFREQUENCY ABLATION NERVES     REDUCTION MAMMAPLASTY Bilateral 05/2016   RIGHT HIP ARTHROSCOPY FOR LABRAL TEAR  ABOUT 2010   2012 also   SHOULDER ARTHROSCOPY  05-06-12   bone spur   TEE WITHOUT CARDIOVERSION N/A 08/05/2020   Procedure: TRANSESOPHAGEAL ECHOCARDIOGRAM (TEE);  Surgeon: Lewayne Bunting, MD;  Location: [facility] ENDOSCOPY;  Service: Cardiovascular;  Laterality: N/A;   TONSILLECTOMY     AS A CHILD   TOTAL HIP ARTHROPLASTY Right 05/13/2012   Procedure: TOTAL HIP ARTHROPLASTY ANTERIOR APPROACH;  Surgeon: Loanne Drilling, MD;  Location: [facility] ORS;  Service: Orthopedics;  Laterality: Right;    SOCIAL HISTORY: Social History   Socioeconomic History   Marital status: Married    Spouse name: bruce   Number of children: 2   Years of education: Not on file   Highest education level: Associate degree: occupational, Scientist, product/process development, or vocational program  Occupational History    Comment: disabled  Tobacco Use   Smoking status: Never   Smokeless tobacco: Never  Vaping Use   Vaping status: Never  Used  Substance and Sexual Activity   Alcohol use: No    Alcohol/week: 0.0 standard drinks of alcohol   Drug use: Yes    Comment: prescribed morphine and xanax   Sexual activity: Not Currently  Other Topics Concern   Not on file  Social History Narrative   Son, daughter  And 4 grandkids; raises 3 of the grandchildren      Lives in [place]; with husband; never smoked; no alcohol; 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    Social Determinants of Health   Financial Resource Strain: Low Risk  (09/20/2022)   Overall Financial Resource Strain (CARDIA)    Difficulty of Paying Living Expenses: Not hard at all  Food Insecurity: No Food Insecurity (09/20/2022)   Hunger Vital Sign    Worried About Running Out of Food in the Last Year: Never true    Ran Out of Food in the Last Year: Never true  Transportation Needs: No Transportation Needs (09/20/2022)   PRAPARE - Administrator, Civil Service (Medical):  No    Lack of Transportation (Non-Medical): No  Physical Activity: Insufficiently Active (09/20/2022)   Exercise Vital Sign    Days of Exercise per Week: 2 days    Minutes of Exercise per Session: 20 min  Stress: Stress Concern Present (09/20/2022)   [employer] of Occupational Health - Occupational Stress Questionnaire    Feeling of Stress : Rather much  Social Connections: Moderately Integrated (09/20/2022)   Social Connection and Isolation Panel [NHANES]    Frequency of Communication with Friends and Family: More than three times a week    Frequency of Social Gatherings with Friends and Family: Twice a week    Attends Religious Services: More than 4 times per year    Active Member of [employer] or Organizations: No    Attends Banker Meetings: Never    Marital Status: Married  Catering manager Violence: Not At Risk (09/20/2022)   Humiliation, Afraid, Rape, and Kick questionnaire    Fear of Current or Ex-Partner: No    Emotionally Abused: No    Physically Abused: No    Sexually Abused: No    FAMILY HISTORY: Family History  Problem Relation Age of Onset   Aneurysm Mother    Bipolar disorder Sister    Aneurysm Maternal Grandmother    Breast cancer Paternal Grandmother 60   Bipolar disorder Grandchild    Anxiety disorder Grandchild    Depression Grandchild    Cancer Neg Hx     ALLERGIES:  is allergic to amoxicillin, chlorhexidine gluconate, clindamycin, codeine, erythromycin, penicillin g, sulfa antibiotics, levofloxacin, shellfish allergy, decadron [dexamethasone], flecainide, mangifera indica, papaya derivatives, betadine [povidone iodine], clarithromycin, other, povidone-iodine, and prednisone.  MEDICATIONS:  Current Outpatient Medications  Medication Sig Dispense Refill   acetaminophen (TYLENOL) 325 MG tablet Take 2 tablets (650 mg total) by mouth every 6 (six) hours as needed for mild pain (or Fever >/= 101).     ALPRAZolam (XANAX) 1 MG tablet Take 1 mg by  mouth at bedtime.      dexlansoprazole (DEXILANT) 60 MG capsule TAKE 1 CAPSULE BY MOUTH EVERY DAY 90 capsule 1   diazepam (VALIUM) 5 MG tablet PLACE 1 TABLET VAGINALLY NIGHTLY AS NEEDED FOR MUSCLE SPASM/ PELVIC PAIN. 10 tablet 0   diltiazem (CARDIZEM CD) 240 MG 24 hr capsule Take 240 mg by mouth daily.     ELIQUIS 5 MG TABS tablet TAKE 1 TABLET BY MOUTH TWICE A DAY 60 tablet 5  estradiol (ESTRACE VAGINAL) 0.1 MG/GM vaginal cream Place 1 Applicatorful vaginally 3 (three) times a week. 42.5 g 12   linaclotide (LINZESS) 290 MCG CAPS capsule TAKE 1 CAPSULE BY MOUTH DAILY BEFORE BREAKFAST. 30 capsule 5   mirtazapine (REMERON) 7.5 MG tablet Take 7.5 mg by mouth at bedtime.     montelukast (SINGULAIR) 10 MG tablet TAKE 1 TABLET BY MOUTH EVERY DAY 90 tablet 1   morphine (MSIR) 15 MG tablet Take 1 tablet (15 mg total) by mouth every 6 (six) hours as needed for moderate pain or severe pain. Must last 30 days. 120 tablet 0   [START ON 11/18/2022] morphine (MSIR) 15 MG tablet Take 1 tablet (15 mg total) by mouth every 6 (six) hours as needed for moderate pain or severe pain. Must last 30 days. 120 tablet 0   naloxone (NARCAN) nasal spray 4 mg/0.1 mL Place 1 spray into the nose as needed for up to 365 doses (for opioid-induced respiratory depresssion). In case of emergency (overdose), spray once into each nostril. If no response within 3 minutes, repeat application and call 911. 1 each 0   valACYclovir (VALTREX) 1000 MG tablet TAKE TWO TABLETS BY MOUTH TWICE DAILY FOR ONE DAY FEVER BLISTER 4 tablet 5   morphine (MSIR) 15 MG tablet Take 1 tablet (15 mg total) by mouth every 6 (six) hours as needed for moderate pain or severe pain. Must last 30 days. 120 tablet 0   No current facility-administered medications for this visit.   Facility-Administered Medications Ordered in Other Visits  Medication Dose Route Frequency Provider Last Rate Last Admin   iron sucrose (VENOFER) 200 mg in sodium chloride 0.9 % 100 mL IVPB   200 mg Intravenous Once Earna Coder, MD 440 mL/hr at 11/03/22 1429 200 mg at 11/03/22 1429      PHYSICAL EXAMINATION:   Vitals:   11/03/22 1303  BP: 117/76  Pulse: 74  Temp: 98.2 F (36.8 C)  SpO2: 98%    Filed Weights   11/03/22 1303  Weight: 152 lb 6.4 oz (69.1 kg)   Irregular rhythm.  Physical Exam Vitals and nursing note reviewed.  HENT:     Head: Normocephalic and atraumatic.     Mouth/Throat:     Pharynx: Oropharynx is clear.  Eyes:     Extraocular Movements: Extraocular movements intact.     Pupils: Pupils are equal, round, and reactive to light.  Cardiovascular:     Rate and Rhythm: Normal rate.  Pulmonary:     Comments: Decreased breath sounds bilaterally.  Abdominal:     Palpations: Abdomen is soft.  Musculoskeletal:        General: Normal range of motion.     Cervical back: Normal range of motion.  Skin:    General: Skin is warm.  Neurological:     General: No focal deficit present.     Mental Status: She is alert and oriented to person, place, and time.  Psychiatric:        Behavior: Behavior normal.        Judgment: Judgment normal.     LABORATORY DATA:  I have reviewed the data as listed Lab Results  Component Value Date   WBC 5.9 11/03/2022   HGB 12.7 11/03/2022   HCT 38.1 11/03/2022   MCV 86.4 11/03/2022   PLT 360 11/03/2022   Recent Labs    01/04/22 0553 01/07/22 0420 05/05/22 1321 11/03/22 1259  NA 139 139 137 136  K 4.0 3.8 3.7  3.9  CL 108 109 102 105  CO2 24 24 24 23   GLUCOSE 97 105* 121* 153*  BUN 11 12 10 10   CREATININE 0.80 0.82 0.95 0.83  CALCIUM 8.7* 8.1* 9.2 8.7*  GFRNONAA >60 >60 >60 >60  PROT 6.0*  --   --   --   ALBUMIN 3.3*  --   --   --   AST 12*  --   --   --   ALT 10  --   --   --   ALKPHOS 87  --   --   --   BILITOT 0.5  --   --   --      No results found.  Iron deficiency # Anemia- iron deficiency-[NOV 2022] hemoglobin:11 Ferritin-7 .  Poor tolerance to p.o. iron. today hemoglobin is  12- proceed with infusion.   # Etiology: Unclear etiology- ? Interstitial cystitis- Status post EGD/colonoscopy-2022-monitor for now.  # IBS-Diarrhea-?  Atrophic pancreas/pancreatic insufficiency on Creon; as per GI-   # A.fib 5 mg BID [Dr.Agbor]-awaiting ablation #3 in oct 2024. rate controlled.- stable.  # Hypocalcemia-8.7; continue dietary supplementation. Patient poor tolerance to calcium plus vitamin D. Pending las today.   # DISPOSITION: # Venofer  # follow up in 6 months- MD: labs- cbc/bmp;iron studies/ferritin- possible IV venofer-Dr.B  All questions were answered. The patient knows to call the clinic with any problems, questions or concerns.    Earna Coder, MD 11/03/2022 2:36 PM

## 2022-11-03 NOTE — Assessment & Plan Note (Addendum)
#   Anemia- iron deficiency-[NOV 2022] hemoglobin:11 Ferritin-7 .  Poor tolerance to p.o. iron. today hemoglobin is 12- proceed with infusion.   # Etiology: Unclear etiology- ? Interstitial cystitis- Status post EGD/colonoscopy-2022-monitor for now.  # IBS-Diarrhea-?  Atrophic pancreas/pancreatic insufficiency on Creon; as per GI-   # A.fib 5 mg BID [Dr.Agbor]-awaiting ablation #3 in oct 2024. rate controlled.- stable.  # Hypocalcemia-8.7; continue dietary supplementation. Patient poor tolerance to calcium plus vitamin D. Pending las today.   # DISPOSITION: # Venofer  # follow up in 6 months- MD: labs- cbc/bmp;iron studies/ferritin- possible IV venofer-Dr.B

## 2022-11-09 ENCOUNTER — Encounter: Payer: Medicare Other | Admitting: Physical Therapy

## 2022-11-09 ENCOUNTER — Telehealth: Payer: Self-pay | Admitting: Gastroenterology

## 2022-11-09 NOTE — Telephone Encounter (Signed)
Patient called in to reschedule her appointment °

## 2022-11-24 ENCOUNTER — Telehealth: Payer: Self-pay

## 2022-11-24 NOTE — Telephone Encounter (Signed)
Called pt to offer sooner appt for Afib Ablation. Her procedure has been moved to 9/23 at 730. I have sent staff message to [facility] CT Scheduler) to make sure we can move up her CT scan.   Cath Lab has been notified Pre-cert & scheduler has been notified  Pt aware that I will call her back on Monday 9/16 to finalize the dates/times of everything and go over the details of her procedure.

## 2022-11-27 NOTE — Telephone Encounter (Signed)
Spoke with pt - Her CT has been rescheduled to 9/18 at 9:30 in [place]. She will not need additional labwork done as she has results in her chart that were done by her Oncologist.   Pt is aware of date/time of procedure and CT Scan. I will send her Instruction letters via MyChart.

## 2022-11-28 ENCOUNTER — Telehealth (HOSPITAL_COMMUNITY): Payer: Self-pay | Admitting: *Deleted

## 2022-11-28 NOTE — Telephone Encounter (Signed)
Reaching out to patient to offer assistance regarding upcoming cardiac imaging study; pt verbalizes understanding of appt date/time, parking situation and where to check in, pre-test NPO status; name and call back number provided for further questions should they arise  Larey Brick RN Navigator Cardiac Imaging [facility] [phone] office [phone] cell

## 2022-11-29 ENCOUNTER — Ambulatory Visit
Admission: RE | Admit: 2022-11-29 | Discharge: 2022-11-29 | Disposition: A | Discharge status: Home / Self Care | Payer: Medicare Other | Source: Ambulatory Visit | Attending: Cardiology | Admitting: Cardiology

## 2022-11-29 ENCOUNTER — Other Ambulatory Visit: Payer: Self-pay | Admitting: Gastroenterology

## 2022-11-29 DIAGNOSIS — I480 Paroxysmal atrial fibrillation: Secondary | ICD-10-CM | POA: Diagnosis not present

## 2022-11-29 MED ORDER — IOHEXOL 350 MG/ML SOLN
75.0000 mL | Freq: Once | INTRAVENOUS | Status: AC | PRN
  Administered 2022-11-29: 75 mL via INTRAVENOUS

## 2022-11-29 MED ORDER — SODIUM CHLORIDE 0.9 % IV BOLUS
150.0000 mL | Freq: Once | INTRAVENOUS | Status: AC
  Administered 2022-11-29: 150 mL via INTRAVENOUS

## 2022-12-01 NOTE — Pre-Procedure Instructions (Signed)
Instructed patient on the following items: Arrival time 0515 Nothing to eat or drink after midnight No meds AM of procedure Responsible person to drive you home and stay with you for 24 hrs  Have you missed any doses of anti-coagulant Eliquis- takes twice a day, hasn't missed any doses.  Don't take dose on Monday morning.

## 2022-12-04 ENCOUNTER — Encounter (HOSPITAL_COMMUNITY)
Admission: RE | Disposition: A | Discharge status: Home / Self Care | Payer: Medicare Other | Source: Home / Self Care | Attending: Cardiology

## 2022-12-04 ENCOUNTER — Encounter: Payer: Self-pay | Admitting: Internal Medicine

## 2022-12-04 ENCOUNTER — Other Ambulatory Visit (HOSPITAL_COMMUNITY): Payer: Self-pay

## 2022-12-04 ENCOUNTER — Ambulatory Visit (HOSPITAL_BASED_OUTPATIENT_CLINIC_OR_DEPARTMENT_OTHER): Payer: Medicare Other | Admitting: Anesthesiology

## 2022-12-04 ENCOUNTER — Ambulatory Visit (HOSPITAL_COMMUNITY)
Admission: RE | Admit: 2022-12-04 | Discharge: 2022-12-04 | Disposition: A | Discharge status: Home / Self Care | Payer: Medicare Other | Attending: Cardiology | Admitting: Cardiology

## 2022-12-04 ENCOUNTER — Ambulatory Visit (HOSPITAL_COMMUNITY): Payer: Medicare Other | Admitting: Anesthesiology

## 2022-12-04 DIAGNOSIS — I4819 Other persistent atrial fibrillation: Secondary | ICD-10-CM | POA: Diagnosis not present

## 2022-12-04 DIAGNOSIS — I10 Essential (primary) hypertension: Secondary | ICD-10-CM

## 2022-12-04 DIAGNOSIS — I4891 Unspecified atrial fibrillation: Secondary | ICD-10-CM

## 2022-12-04 DIAGNOSIS — I483 Typical atrial flutter: Secondary | ICD-10-CM | POA: Insufficient documentation

## 2022-12-04 DIAGNOSIS — I4892 Unspecified atrial flutter: Secondary | ICD-10-CM

## 2022-12-04 DIAGNOSIS — Z7901 Long term (current) use of anticoagulants: Secondary | ICD-10-CM | POA: Diagnosis not present

## 2022-12-04 DIAGNOSIS — J4520 Mild intermittent asthma, uncomplicated: Secondary | ICD-10-CM | POA: Diagnosis not present

## 2022-12-04 DIAGNOSIS — G4733 Obstructive sleep apnea (adult) (pediatric): Secondary | ICD-10-CM | POA: Diagnosis not present

## 2022-12-04 DIAGNOSIS — I484 Atypical atrial flutter: Secondary | ICD-10-CM | POA: Insufficient documentation

## 2022-12-04 HISTORY — PX: ATRIAL FIBRILLATION ABLATION: EP1191

## 2022-12-04 LAB — CBC
HCT: 37.9 % (ref 36.0–46.0)
Hemoglobin: 12.3 g/dL (ref 12.0–15.0)
MCH: 28.1 pg (ref 26.0–34.0)
MCHC: 32.5 g/dL (ref 30.0–36.0)
MCV: 86.7 fL (ref 80.0–100.0)
Platelets: 345 10*3/uL (ref 150–400)
RBC: 4.37 MIL/uL (ref 3.87–5.11)
RDW: 13.3 % (ref 11.5–15.5)
WBC: 6.9 10*3/uL (ref 4.0–10.5)
nRBC: 0 % (ref 0.0–0.2)

## 2022-12-04 LAB — BASIC METABOLIC PANEL
Anion gap: 11 (ref 5–15)
BUN: 5 mg/dL — ABNORMAL LOW (ref 8–23)
CO2: 22 mmol/L (ref 22–32)
Calcium: 9 mg/dL (ref 8.9–10.3)
Chloride: 103 mmol/L (ref 98–111)
Creatinine, Ser: 0.69 mg/dL (ref 0.44–1.00)
GFR, Estimated: >60 mL/min (ref >60)
Glucose, Bld: 108 mg/dL — ABNORMAL HIGH (ref 70–99)
Potassium: 3.4 mmol/L — ABNORMAL LOW (ref 3.5–5.1)
Sodium: 136 mmol/L (ref 135–145)

## 2022-12-04 LAB — POCT ACTIVATED CLOTTING TIME
Activated Clotting Time: 336 seconds
Activated Clotting Time: 385 seconds

## 2022-12-04 SURGERY — ATRIAL FIBRILLATION ABLATION
Anesthesia: General

## 2022-12-04 MED ORDER — COLCHICINE 0.6 MG PO TABS
0.6000 mg | ORAL_TABLET | Freq: Two times a day (BID) | ORAL | Status: DC
  Administered 2022-12-04: 0.6 mg via ORAL
  Filled 2022-12-04: qty 1

## 2022-12-04 MED ORDER — LIDOCAINE 2% (20 MG/ML) 5 ML SYRINGE
INTRAMUSCULAR | Status: DC | PRN
  Administered 2022-12-04: 100 mg via INTRAVENOUS

## 2022-12-04 MED ORDER — HEPARIN SODIUM (PORCINE) 1000 UNIT/ML IJ SOLN
INTRAMUSCULAR | Status: DC | PRN
  Administered 2022-12-04: 1000 [IU] via INTRAVENOUS

## 2022-12-04 MED ORDER — SUGAMMADEX SODIUM 200 MG/2ML IV SOLN
INTRAVENOUS | Status: DC | PRN
  Administered 2022-12-04: 200 mg via INTRAVENOUS

## 2022-12-04 MED ORDER — MORPHINE SULFATE 15 MG PO TABS
15.0000 mg | ORAL_TABLET | Freq: Four times a day (QID) | ORAL | Status: DC | PRN
  Administered 2022-12-04: 15 mg via ORAL
  Filled 2022-12-04: qty 1

## 2022-12-04 MED ORDER — ONDANSETRON HCL 4 MG/2ML IJ SOLN: 4.0000 mg | Freq: Four times a day (QID) | INTRAMUSCULAR | Status: DC | PRN

## 2022-12-04 MED ORDER — HEPARIN SODIUM (PORCINE) 1000 UNIT/ML IJ SOLN
INTRAMUSCULAR | Status: AC
  Filled 2022-12-04: qty 10

## 2022-12-04 MED ORDER — FENTANYL CITRATE (PF) 100 MCG/2ML IJ SOLN
INTRAMUSCULAR | Status: DC | PRN
  Administered 2022-12-04: 50 ug via INTRAVENOUS

## 2022-12-04 MED ORDER — SODIUM CHLORIDE 0.9% FLUSH: 3.0000 mL | Freq: Two times a day (BID) | INTRAVENOUS | Status: DC

## 2022-12-04 MED ORDER — PROPOFOL 10 MG/ML IV BOLUS
INTRAVENOUS | Status: DC | PRN
  Administered 2022-12-04: 150 mg via INTRAVENOUS

## 2022-12-04 MED ORDER — APIXABAN 5 MG PO TABS
5.0000 mg | ORAL_TABLET | Freq: Two times a day (BID) | ORAL | Status: DC
  Administered 2022-12-04: 5 mg via ORAL
  Filled 2022-12-04: qty 1

## 2022-12-04 MED ORDER — PROTAMINE SULFATE 10 MG/ML IV SOLN
INTRAVENOUS | Status: DC | PRN
  Administered 2022-12-04: 35 mg via INTRAVENOUS

## 2022-12-04 MED ORDER — SODIUM CHLORIDE 0.9 % IV SOLN: 250.0000 mL | INTRAVENOUS | Status: DC | PRN

## 2022-12-04 MED ORDER — ONDANSETRON HCL 4 MG/2ML IJ SOLN
INTRAMUSCULAR | Status: DC | PRN
Start: 2022-12-04 — End: 2022-12-04
  Administered 2022-12-04: 4 mg via INTRAVENOUS

## 2022-12-04 MED ORDER — COLCHICINE 0.6 MG PO TABS
0.6000 mg | ORAL_TABLET | Freq: Two times a day (BID) | ORAL | 0 refills | Status: DC
  Filled 2022-12-04: qty 10, 5d supply, fill #0

## 2022-12-04 MED ORDER — HEPARIN SODIUM (PORCINE) 1000 UNIT/ML IJ SOLN
INTRAMUSCULAR | Status: DC | PRN
  Administered 2022-12-04: 11000 [IU] via INTRAVENOUS

## 2022-12-04 MED ORDER — SODIUM CHLORIDE 0.9 % IV SOLN: INTRAVENOUS | Status: DC

## 2022-12-04 MED ORDER — ROCURONIUM BROMIDE 10 MG/ML (PF) SYRINGE
PREFILLED_SYRINGE | INTRAVENOUS | Status: DC | PRN
  Administered 2022-12-04: 60 mg via INTRAVENOUS

## 2022-12-04 MED ORDER — PANTOPRAZOLE SODIUM 40 MG PO TBEC
40.0000 mg | DELAYED_RELEASE_TABLET | Freq: Every day | ORAL | Status: DC
  Administered 2022-12-04: 40 mg via ORAL
  Filled 2022-12-04: qty 1

## 2022-12-04 MED ORDER — SODIUM CHLORIDE 0.9% FLUSH: 3.0000 mL | INTRAVENOUS | Status: DC | PRN

## 2022-12-04 MED ORDER — PANTOPRAZOLE SODIUM 40 MG PO TBEC
40.0000 mg | DELAYED_RELEASE_TABLET | Freq: Every day | ORAL | 0 refills | Status: DC
  Filled 2022-12-04: qty 45, 45d supply, fill #0

## 2022-12-04 MED ORDER — ACETAMINOPHEN 325 MG PO TABS: 650.0000 mg | ORAL_TABLET | ORAL | Status: DC | PRN

## 2022-12-04 MED ORDER — HEPARIN (PORCINE) IN NACL 1000-0.9 UT/500ML-% IV SOLN
INTRAVENOUS | Status: DC | PRN
  Administered 2022-12-04 (×4): 500 mL

## 2022-12-04 MED ORDER — PROPOFOL 10 MG/ML IV BOLUS: INTRAVENOUS | Status: DC | PRN

## 2022-12-04 MED ORDER — PROPOFOL 500 MG/50ML IV EMUL
INTRAVENOUS | Status: DC | PRN
Start: 2022-12-04 — End: 2022-12-04
  Administered 2022-12-04: 150 ug/kg/min via INTRAVENOUS

## 2022-12-04 SURGICAL SUPPLY — 19 items
BAG SNAP BAND KOVER 36X36 (MISCELLANEOUS) IMPLANT
BLANKET WARM UNDERBOD FULL ACC (MISCELLANEOUS) ×1 IMPLANT
CATH ABLAT QDOT MICRO BI TC DF (CATHETERS) IMPLANT
CATH OCTARAY 2.0 F 3-3-3-3-3 (CATHETERS) IMPLANT
CATH S-M CIRCA TEMP PROBE (CATHETERS) IMPLANT
CATH SOUNDSTAR ECO 8FR (CATHETERS) IMPLANT
CATH WEBSTER BI DIR CS D-F CRV (CATHETERS) IMPLANT
CLOSURE PERCLOSE PROSTYLE (VASCULAR PRODUCTS) IMPLANT
COVER SWIFTLINK CONNECTOR (BAG) ×1 IMPLANT
PACK EP LATEX FREE (CUSTOM PROCEDURE TRAY) ×1
PACK EP LF (CUSTOM PROCEDURE TRAY) ×1 IMPLANT
PAD DEFIB RADIO PHYSIO CONN (PAD) ×1 IMPLANT
PATCH CARTO3 (PAD) IMPLANT
SHEATH CARTO VIZIGO SM CVD (SHEATH) IMPLANT
SHEATH PINNACLE 8F 10CM (SHEATH) IMPLANT
SHEATH PINNACLE 9F 10CM (SHEATH) IMPLANT
SHEATH PROBE COVER 6X72 (BAG) IMPLANT
SHEATH WIRE KIT BAYLIS SL1 (KITS) IMPLANT
TUBING SMART ABLATE COOLFLOW (TUBING) IMPLANT

## 2022-12-04 NOTE — Anesthesia Preprocedure Evaluation (Signed)
Anesthesia Evaluation  Patient identified by MRN, date of birth, ID band Patient awake    Reviewed: Allergy & Precautions, H&P , NPO status , Patient's Chart, lab work & pertinent test results  History of Anesthesia Complications (+) PONV and history of anesthetic complications  Airway Mallampati: III  TM Distance: <3 FB Neck ROM: Full    Dental no notable dental hx.    Pulmonary asthma , sleep apnea    Pulmonary exam normal breath sounds clear to auscultation       Cardiovascular hypertension, Normal cardiovascular exam+ dysrhythmias Atrial Fibrillation  Rhythm:Regular Rate:Normal     Neuro/Psych negative neurological ROS  negative psych ROS   GI/Hepatic Neg liver ROS,GERD  ,,  Endo/Other  negative endocrine ROS    Renal/GU negative Renal ROS  negative genitourinary   Musculoskeletal negative musculoskeletal ROS (+)    Abdominal   Peds negative pediatric ROS (+)  Hematology negative hematology ROS (+)   Anesthesia Other Findings   Reproductive/Obstetrics negative OB ROS                             Anesthesia Physical Anesthesia Plan  ASA: 3  Anesthesia Plan: General   Post-op Pain Management: Minimal or no pain anticipated   Induction: Intravenous  PONV Risk Score and Plan: 4 or greater and Ondansetron, Dexamethasone, Midazolam and Treatment may vary due to age or medical condition  Airway Management Planned: Oral ETT  Additional Equipment:   Intra-op Plan:   Post-operative Plan: Extubation in OR  Informed Consent: I have reviewed the patients History and Physical, chart, labs and discussed the procedure including the risks, benefits and alternatives for the proposed anesthesia with the patient or authorized representative who has indicated his/her understanding and acceptance.     Dental advisory given  Plan Discussed with: CRNA and Surgeon  Anesthesia Plan  Comments:        Anesthesia Quick Evaluation

## 2022-12-04 NOTE — Transfer of Care (Signed)
Immediate Anesthesia Transfer of Care Note  Patient: [name]  Procedure(s) Performed: ATRIAL FIBRILLATION ABLATION  Patient Location: Cath Lab  Anesthesia Type:General  Level of Consciousness: awake and alert   Airway & Oxygen Therapy: Patient Spontanous Breathing and Patient connected to nasal cannula oxygen  Post-op Assessment: Report given to RN and Post -op Vital signs reviewed and stable  Post vital signs: Reviewed and stable  Last Vitals:  Vitals Value Taken Time  BP    Temp    Pulse    Resp    SpO2      Last Pain:  Vitals:   12/04/22 0617  TempSrc:   PainSc: 7       Patients Stated Pain Goal: 3 (12/04/22 0617)  Complications: No notable events documented.

## 2022-12-04 NOTE — Anesthesia Procedure Notes (Signed)
Procedure Name: Intubation Date/Time: 12/04/2022 7:52 AM  Performed by: Samara Deist, CRNAPre-anesthesia Checklist: Patient identified, Emergency Drugs available, Suction available and Patient being monitored Patient Re-evaluated:Patient Re-evaluated prior to induction Oxygen Delivery Method: Circle System Utilized Preoxygenation: Pre-oxygenation with 100% oxygen Induction Type: IV induction Ventilation: Mask ventilation without difficulty Laryngoscope Size: Mac (3.5) Grade View: Grade II Tube type: Oral Tube size: 7.0 mm Number of attempts: 1 Airway Equipment and Method: Stylet Placement Confirmation: ETT inserted through vocal cords under direct vision, positive ETCO2 and breath sounds checked- equal and bilateral Secured at: 22 cm Tube secured with: Tape Dental Injury: Teeth and Oropharynx as per pre-operative assessment

## 2022-12-04 NOTE — Progress Notes (Addendum)
Left arm weakness continues per pt, asking when it should go away? I secure chatted Dr Lalla Brothers and Otilio Saber PA EP to advise. Will wait for further notification.  [address] PA came and reassessed pt, no further orders.

## 2022-12-04 NOTE — H&P (Signed)
Electrophysiology Office Follow up Visit Note:     Date:  12/04/2022    ID:  [name], DOB 1960/07/21, MRN [id number]   PCP:  Erasmo Downer, MD       [facility] Cardiologist:  Debbe Odea, MD  [facility] Electrophysiologist:  Lanier Prude, MD      Interval History:     [name] is a 62 y.o. female who presents for a follow up visit.    Last seen August 23, 2022 by Levy Sjogren.  The patient had an A-fib ablation on Aug 06, 2020 during which the pulmonary veins, posterior wall and CTI were ablated. In June 2023 she had a redo ablation at [facility] utilizing cryoenergy.  During that procedure the veins and posterior wall are really isolated.  A mitral isthmus line was added with radiofrequency ablation.  A ZIO monitor ordered in April 2024 showed a 22% burden of atrial fibrillation.  She is on Eliquis twice daily. At the previous appointment with Rosalita Chessman, repeat catheter ablation and surgical ablation were discussed.      Presents for redo PVI today. Procedure reviewed.       Objective Past medical, surgical, social and family history were reviewed.   ROS:   Please see the history of present illness.    All other systems reviewed and are negative.   EKGs/Labs/Other Studies Reviewed:     The following studies were reviewed today:       EKG Interpretation Date/Time:                  Tuesday October 24 2022 09:58:43 EDT Ventricular Rate:         71 PR Interval:                 138 QRS Duration:             80 QT Interval:                 388 QTC Calculation:421 R Axis:                         4   Text Interpretation:Normal sinus rhythm Minimal voltage criteria for LVH, may be normal variant ( R in aVL ) Confirmed by Steffanie Dunn [phone]) on 10/24/2022 10:04:43 AM     Physical Exam:     VS:  BP 133/76 (BP Location: Left Arm, Patient Position: Sitting, Cuff Size: Normal)   Pulse 65   Ht 5\' 3"  (1.6 m)   Wt 151 lb (68.5 kg)   BMI 26.75  kg/m         Wt Readings from Last 3 Encounters:  10/24/22 151 lb (68.5 kg)  09/20/22 154 lb (69.9 kg)  08/30/22 154 lb (69.9 kg)      GEN:  Well nourished, well developed in no acute distress CARDIAC: RRR, no murmurs, rubs, gallops RESPIRATORY:  Clear to auscultation without rales, wheezing or rhonchi          Assessment ASSESSMENT:     1. Persistent atrial fibrillation (HCC)   2. Primary hypertension     PLAN:     In order of problems listed above:   #Persistent atrial fibrillation and flutter With 2 prior ablations.  Previous ablation lesion sets included the pulmonary veins, posterior wall, mitral isthmus line, cavotricuspid isthmus line. Continue Eliquis for stroke prophylaxis.   I did discuss treatment options again with the patient during today's  visit.  I discussed surgical A-fib ablation and redo catheter ablation.  I do think there is utility in a repeat catheter ablation attempt given her veins and posterior wall have proven difficult to isolate.  If, during repeat catheter ablation the posterior wall and pulmonary veins were still silent, would recommend referral for surgical ablation.  If there are areas to touchup/reisolate, we have a good chance to achieve improved rhythm control with catheter ablation.   Discussed treatment options today for AF including antiarrhythmic drug therapy and ablation. Discussed risks, recovery and likelihood of success with each treatment strategy. Risk, benefits, and alternatives to EP study and ablation for afib were discussed. These risks include but are not limited to stroke, bleeding, vascular damage, tamponade, perforation, damage to the esophagus, lungs, phrenic nerve and other structures, pulmonary vein stenosis, worsening renal function, coronary vasospasm and death.  Discussed potential need for repeat ablation procedures and antiarrhythmic drugs after an initial ablation. The patient understands these risk and wishes to proceed.   We will therefore proceed with catheter ablation at the next available time.  Carto, ICE, anesthesia are requested for the procedure.  Will also obtain CT PV protocol prior to the procedure to exclude LAA thrombus and further evaluate atrial anatomy.   We discussed Tikosyn as well during today's appointment.  If she were to have a recurrence after a third time ablation or if there are few targets during the third ablation, favor initiating Tikosyn.  QTc stable on today's EKG.   #Hypertension At goal today.  Recommend checking blood pressures 1-2 times per week at home and recording the values.  Recommend bringing these recordings to the primary care physician.     Presents for redo PVI today. Procedure reviewed.       Signed, Steffanie Dunn, MD, [facility], [facility] 12/04/2022 Electrophysiology [facility]

## 2022-12-04 NOTE — Progress Notes (Addendum)
Patient's right hand grip strong versus left hand grip weaker.  Other neuro assessments appropriate and within normal limits at this time.  PA Andy at bedside during assessment and made aware.  No new orders at this time.  This RN reported off to [employer].

## 2022-12-04 NOTE — Discharge Instructions (Signed)

## 2022-12-04 NOTE — Anesthesia Postprocedure Evaluation (Signed)
Anesthesia Post Note  Patient: [name]  Procedure(s) Performed: ATRIAL FIBRILLATION ABLATION     Patient location during evaluation: PACU Anesthesia Type: General Level of consciousness: awake and alert Pain management: pain level controlled Vital Signs Assessment: post-procedure vital signs reviewed and stable Respiratory status: spontaneous breathing, nonlabored ventilation, respiratory function stable and patient connected to nasal cannula oxygen Cardiovascular status: blood pressure returned to baseline and stable Postop Assessment: no apparent nausea or vomiting Anesthetic complications: no  No notable events documented.  Last Vitals:  Vitals:   12/04/22 0603 12/04/22 0607  BP: 133/76   Pulse: 65   Resp: 19   Temp:  (!) 36.4 C  SpO2: 97%     Last Pain:  Vitals:   12/04/22 1010  TempSrc:   PainSc: 0-No pain                 Ameerah Huffstetler S

## 2022-12-05 ENCOUNTER — Other Ambulatory Visit: Payer: Self-pay | Admitting: Gastroenterology

## 2022-12-05 ENCOUNTER — Encounter (HOSPITAL_COMMUNITY): Payer: Self-pay | Admitting: Cardiology

## 2022-12-08 NOTE — Patient Instructions (Signed)
____________________________________________________________________________________________  Opioid Pain Medication Update  To: All patients taking opioid pain medications. (I.e.: hydrocodone, hydromorphone, oxycodone, oxymorphone, morphine, codeine, methadone, tapentadol, tramadol, buprenorphine, fentanyl, etc.)  Re: Updated review of side effects and adverse reactions of opioid analgesics, as well as new information about long term effects of this class of medications.  Direct risks of long-term opioid therapy are not limited to opioid addiction and overdose. Potential medical risks include serious fractures, breathing problems during sleep, hyperalgesia, immunosuppression, chronic constipation, bowel obstruction, myocardial infarction, and tooth decay secondary to xerostomia.  Unpredictable adverse effects that can occur even if you take your medication correctly: Cognitive impairment, respiratory depression, and death. Most people think that if they take their medication "correctly", and "as instructed", that they will be safe. Nothing could be farther from the truth. In reality, a significant amount of recorded deaths associated with the use of opioids has occurred in individuals that had taken the medication for a long time, and were taking their medication correctly. The following are examples of how this can happen: Patient taking his/her medication for a long time, as instructed, without any side effects, is given a certain antibiotic or another unrelated medication, which in turn triggers a "Drug-to-drug interaction" leading to disorientation, cognitive impairment, impaired reflexes, respiratory depression or an untoward event leading to serious bodily harm or injury, including death.  Patient taking his/her medication for a long time, as instructed, without any side effects, develops an acute impairment of liver and/or kidney function. This will lead to a rapid inability of the body to  breakdown and eliminate their pain medication, which will result in effects similar to an "overdose", but with the same medicine and dose that they had always taken. This again may lead to disorientation, cognitive impairment, impaired reflexes, respiratory depression or an untoward event leading to serious bodily harm or injury, including death.  A similar problem will occur with patients as they grow older and their liver and kidney function begins to decrease as part of the aging process.  Background information: Historically, the original case for using long-term opioid therapy to treat chronic noncancer pain was based on safety assumptions that subsequent experience has called into question. In 1996, the American Pain Society and the American Academy of Pain Medicine issued a consensus statement supporting long-term opioid therapy. This statement acknowledged the dangers of opioid prescribing but concluded that the risk for addiction was low; respiratory depression induced by opioids was short-lived, occurred mainly in opioid-naive patients, and was antagonized by pain; tolerance was not a common problem; and efforts to control diversion should not constrain opioid prescribing. This has now proven to be wrong. Experience regarding the risks for opioid addiction, misuse, and overdose in community practice has failed to support these assumptions.  According to the Centers for Disease Control and Prevention, fatal overdoses involving opioid analgesics have increased sharply over the past decade. Currently, more than 96,700 people die from drug overdoses every year. Opioids are a factor in 7 out of every 10 overdose deaths. Deaths from drug overdose have surpassed motor vehicle accidents as the leading cause of death for individuals between the ages of 80 and 61.  Clinical data suggest that neuroendocrine dysfunction may be very common in both men and women, potentially causing hypogonadism, erectile  dysfunction, infertility, decreased libido, osteoporosis, and depression. Recent studies linked higher opioid dose to increased opioid-related mortality. Controlled observational studies reported that long-term opioid therapy may be associated with increased risk for cardiovascular events. Subsequent meta-analysis concluded  that the safety of long-term opioid therapy in elderly patients has not been proven.   Side Effects and adverse reactions: Common side effects: Drowsiness (sedation). Dizziness. Nausea and vomiting. Constipation. Physical dependence -- Dependence often manifests with withdrawal symptoms when opioids are discontinued or decreased. Tolerance -- As you take repeated doses of opioids, you require increased medication to experience the same effect of pain relief. Respiratory depression -- This can occur in healthy people, especially with higher doses. However, people with COPD, asthma or other lung conditions may be even more susceptible to fatal respiratory impairment.  Uncommon side effects: An increased sensitivity to feeling pain and extreme response to pain (hyperalgesia). Chronic use of opioids can lead to this. Delayed gastric emptying (the process by which the contents of your stomach are moved into your small intestine). Muscle rigidity. Immune system and hormonal dysfunction. Quick, involuntary muscle jerks (myoclonus). Arrhythmia. Itchy skin (pruritus). Dry mouth (xerostomia).  Long-term side effects: Chronic constipation. Sleep-disordered breathing (SDB). Increased risk of bone fractures. Hypothalamic-pituitary-adrenal dysregulation. Increased risk of overdose.  RISKS: Respiratory depression and death: Opioids increase the risk of respiratory depression and death.  Drug-to-drug interactions: Opioids are relatively contraindicated in combination with benzodiazepines, sleep inducers, and other central nervous system depressants. Other classes of medications  (i.e.: certain antibiotics and even over-the-counter medications) may also trigger or induce respiratory depression in some patients.  Medical conditions: Patients with pre-existing respiratory problems are at higher risk of respiratory failure and/or depression when in combination with opioid analgesics. Opioids are relatively contraindicated in some medical conditions such as central sleep apnea.   Fractures and Falls:  Opioids increase the risk and incidence of falls. This is of particular importance in elderly patients.  Endocrine System:  Long-term administration is associated with endocrine abnormalities (endocrinopathies). (Also known as Opioid-induced Endocrinopathy) Influences on both the hypothalamic-pituitary-adrenal axis?and the hypothalamic-pituitary-gonadal axis have been demonstrated with consequent hypogonadism and adrenal insufficiency in both sexes. Hypogonadism and decreased levels of dehydroepiandrosterone sulfate have been reported in men and women. Endocrine effects include: Amenorrhoea in women (abnormal absence of menstruation) Reduced libido in both sexes Decreased sexual function Erectile dysfunction in men Hypogonadisms (decreased testicular function with shrinkage of testicles) Infertility Depression and fatigue Loss of muscle mass Anxiety Depression Immune suppression Hyperalgesia Weight gain Anemia Osteoporosis Patients (particularly women of childbearing age) should avoid opioids. There is insufficient evidence to recommend routine monitoring of asymptomatic patients taking opioids in the long-term for hormonal deficiencies.  Immune System: Human studies have demonstrated that opioids have an immunomodulating effect. These effects are mediated via opioid receptors both on immune effector cells and in the central nervous system. Opioids have been demonstrated to have adverse effects on antimicrobial response and anti-tumour surveillance. Buprenorphine has  been demonstrated to have no impact on immune function.  Opioid Induced Hyperalgesia: Human studies have demonstrated that prolonged use of opioids can lead to a state of abnormal pain sensitivity, sometimes called opioid induced hyperalgesia (OIH). Opioid induced hyperalgesia is not usually seen in the absence of tolerance to opioid analgesia. Clinically, hyperalgesia may be diagnosed if the patient on long-term opioid therapy presents with increased pain. This might be qualitatively and anatomically distinct from pain related to disease progression or to breakthrough pain resulting from development of opioid tolerance. Pain associated with hyperalgesia tends to be more diffuse than the pre-existing pain and less defined in quality. Management of opioid induced hyperalgesia requires opioid dose reduction.  Cancer: Chronic opioid therapy has been associated with an increased risk of cancer  among noncancer patients with chronic pain. This association was more evident in chronic strong opioid users. Chronic opioid consumption causes significant pathological changes in the small intestine and colon. Epidemiological studies have found that there is a link between opium dependence and initiation of gastrointestinal cancers. Cancer is the second leading cause of death after cardiovascular disease. Chronic use of opioids can cause multiple conditions such as GERD, immunosuppression and renal damage as well as carcinogenic effects, which are associated with the incidence of cancers.   Mortality: Long-term opioid use has been associated with increased mortality among patients with chronic non-cancer pain (CNCP).  Prescription of long-acting opioids for chronic noncancer pain was associated with a significantly increased risk of all-cause mortality, including deaths from causes other than overdose.  Reference: Von Korff M, Kolodny A, Deyo RA, Chou R. Long-term opioid therapy reconsidered. Ann Intern Med. 2011  Sep 6;155(5):325-8. doi: 10.7326/[id number]. PMID: 64403474; PMCID: [id number]. Randon Goldsmith, Hayward RA, Dunn KM, [place] KP. Risk of adverse events in patients prescribed long-term opioids: A cohort study in the [place] [facility] Research Datalink. Eur J Pain. 2019 May;23(5):908-922. doi: 10.1002/ejp.1357. Epub 2019 Jan 31. PMID: 43329518. Colameco S, Coren JS, Ciervo CA. Continuous opioid treatment for chronic noncancer pain: a time for moderation in prescribing. Postgrad Med. 2009 Jul;121(4):61-6. doi: 10.3810/pgm.2009.07.2032. PMID: 84166063. William Hamburger RN, [place] SD, Blazina I, Cristopher Peru, Bougatsos C, Deyo RA. The effectiveness and risks of long-term opioid therapy for chronic pain: a systematic review for a [employer] of [employer] Pathways to [employer]. Ann Intern Med. 2015 Feb 17;162(4):276-86. doi: 10.7326/M14-2559. PMID: 01601093. Caryl Bis [facility], Makuc DM. NCHS Data Brief No. 22. [place]: Centers for Disease Control and Prevention; 2009. Sep, Increase in Fatal Poisonings Involving Opioid Analgesics in the [place], 1999-2006. Song IA, Choi HR, Oh TK. Long-term opioid use and mortality in patients with chronic non-cancer pain: Ten-year follow-up study in [place] from 2010 through 2019. EClinicalMedicine. 2022 Jul 18;51:101558. doi: 10.1016/j.eclinm.2022.235573. PMID: 22025427; PMCID: [id number]. Huser, W., Schubert, T., Vogelmann, T. et al. All-cause mortality in patients with long-term opioid therapy compared with non-opioid analgesics for chronic non-cancer pain: a database study. BMC Med 18, 162 (2020). http://lester.info/ Rashidian H, Karie Kirks, Malekzadeh R, Haghdoost AA. An Ecological Study of the Association between Opiate Use and Incidence of Cancers. Addict Health. 2016 Fall;8(4):252-260. PMID: 51761607; PMCID: [id number].  Our Goal: Our goal is to control your  pain with means other than the use of opioid pain medications.  Our Recommendation: Talk to your physician about coming off of these medications. We can assist you with the tapering down and stopping these medicines. Based on the new information, even if you cannot completely stop the medication, a decrease in the dose may be associated with a lesser risk. Ask for other means of controlling the pain. Decrease or eliminate those factors that significantly contribute to your pain such as smoking, obesity, and a diet heavily tilted towards "inflammatory" nutrients.  Last Updated: 09/18/2022   ____________________________________________________________________________________________     ____________________________________________________________________________________________  National Pain Medication Shortage  The [place] is experiencing worsening drug shortages. These have had a negative widespread effect on patient care and treatment. Not expected to improve any time soon. Predicted to last past 2029.   Drug shortage list (generic names) Oxycodone IR Oxycodone/APAP Oxymorphone IR Hydromorphone Hydrocodone/APAP Morphine  Where is the problem?  Manufacturing and supply level.  Will this shortage affect you?  Only if you  take any of the above pain medications.  How? You may be unable to fill your prescription.  Your pharmacist may offer a "partial fill" of your prescription. (Warning: Do not accept partial fills.) Prescriptions partially filled cannot be transferred to another pharmacy. Read our Medication Rules and Regulation. Depending on how much medicine you are dependent on, you may experience withdrawals when unable to get the medication.  Recommendations: Consider ending your dependence on opioid pain medications. Ask your pain specialist to assist you with the process. Consider switching to a medication currently not in shortage, such as Buprenorphine. Talk to your pain  specialist about this option. Consider decreasing your pain medication requirements by managing tolerance thru "Drug Holidays". This may help minimize withdrawals, should you run out of medicine. Control your pain thru the use of non-pharmacological interventional therapies.   Your prescriber: Prescribers cannot be blamed for shortages. Medication manufacturing and supply issues cannot be fixed by the prescriber.   NOTE: The prescriber is not responsible for supplying the medication, or solving supply issues. Work with your pharmacist to solve it. The patient is responsible for the decision to take or continue taking the medication and for identifying and securing a legal supply source. By law, supplying the medication is the job and responsibility of the pharmacy. The prescriber is responsible for the evaluation, monitoring, and prescribing of these medications.   Prescribers will NOT: Re-issue prescriptions that have been partially filled. Re-issue prescriptions already sent to a pharmacy.  Re-send prescriptions to a different pharmacy because yours did not have your medication. Ask pharmacist to order more medicine or transfer the prescription to another pharmacy. (Read below.)  New 2023 regulation: "November 11, 2021 Revised Regulation Allows DEA-Registered Pharmacies to Transfer Electronic Prescriptions at a Patient's Request DEA Headquarters Division - Public Information Office Patients now have the ability to request their electronic prescription be transferred to another pharmacy without having to go back to their practitioner to initiate the request. This revised regulation went into effect on Monday, November 07, 2021.     At a patient's request, a DEA-registered retail pharmacy can now transfer an electronic prescription for a controlled substance (schedules II-V) to another DEA-registered retail pharmacy. Prior to this change, patients would have to go through their practitioner to  cancel their prescription and have it re-issued to a different pharmacy. The process was taxing and time consuming for both patients and practitioners.    The Drug Enforcement Administration [facility]) published its intent to revise the process for transferring electronic prescriptions on January 30, 2020.  The final rule was published in the federal register on October 06, 2021 and went into effect 30 days later.  Under the final rule, a prescription can only be transferred once between pharmacies, and only if allowed under existing state or other applicable law. The prescription must remain in its electronic form; may not be altered in any way; and the transfer must be communicated directly between two licensed pharmacists. It's important to note, any authorized refills transfer with the original prescription, which means the entire prescription will be filled at the same pharmacy".  Reference: HugeHand.is [facility] website announcement)  [email or web address] [employer] of Justice)   [employer] / Vol. 88, No. 143 / Thursday, October 06, 2021 / Rules and Regulations DEPARTMENT OF JUSTICE  Drug Enforcement Administration  21 CFR Part 1306  [Docket No. [id number]]  RIN [id number] Transfer of Electronic Prescriptions for Schedules II-V Controlled Substances Between Pharmacies for Initial Filling  ____________________________________________________________________________________________  ____________________________________________________________________________________________  Transfer of Pain Medication between Pharmacies  Re: 2023 DEA Clarification on existing regulation  Published on DEA Website: November 11, 2021  Title: Revised Regulation Allows DEA-Registered Pharmacies to Electrical engineer Prescriptions at a Patient's  Request DEA Headquarters Division - [employer]  "Patients now have the ability to request their electronic prescription be transferred to another pharmacy without having to go back to their practitioner to initiate the request. This revised regulation went into effect on Monday, November 07, 2021.     At a patient's request, a DEA-registered retail pharmacy can now transfer an electronic prescription for a controlled substance (schedules II-V) to another DEA-registered retail pharmacy. Prior to this change, patients would have to go through their practitioner to cancel their prescription and have it re-issued to a different pharmacy. The process was taxing and time consuming for both patients and practitioners.    The Drug Enforcement Administration [facility]) published its intent to revise the process for transferring electronic prescriptions on January 30, 2020.  The final rule was published in the federal register on October 06, 2021 and went into effect 30 days later.  Under the final rule, a prescription can only be transferred once between pharmacies, and only if allowed under existing state or other applicable law. The prescription must remain in its electronic form; may not be altered in any way; and the transfer must be communicated directly between two licensed pharmacists. It's important to note, any authorized refills transfer with the original prescription, which means the entire prescription will be filled at the same pharmacy."    REFERENCES: 1. DEA website announcement [email or web address]  2. Department of Justice website  [email or web address].pdf  3. DEPARTMENT OF JUSTICE Drug Enforcement Administration 21 CFR Part 1306 [Docket No. [id number]] RIN [id number] "Transfer of Electronic Prescriptions for Schedules II-V Controlled Substances  Between Pharmacies for Initial Filling"  ____________________________________________________________________________________________     _______________________________________________________________________  Medication Rules  Purpose: To inform patients, and their family members, of our medication rules and regulations.  Applies to: All patients receiving prescriptions from our practice (written or electronic).  Pharmacy of record: This is the pharmacy where your electronic prescriptions will be sent. Make sure we have the correct one.  Electronic prescriptions: In compliance with the [facility] Strengthen Opioid Misuse Prevention (STOP) Act of 2017 (Session Conni Elliot [phone]), effective March 13, 2018, all controlled substances must be electronically prescribed. Written prescriptions, faxing, or calling prescriptions to a pharmacy will no longer be done.  Prescription refills: These will be provided only during in-person appointments. No medications will be renewed without a "face-to-face" evaluation with your provider. Applies to all prescriptions.  NOTE: The following applies primarily to controlled substances (Opioid* Pain Medications).   Type of encounter (visit): For patients receiving controlled substances, face-to-face visits are required. (Not an option and not up to the patient.)  Patient's responsibilities: Pain Pills: Bring all pain pills to every appointment (except for procedure appointments). Pill Bottles: Bring pills in original pharmacy bottle. Bring bottle, even if empty. Always bring the bottle of the most recent fill.  Medication refills: You are responsible for knowing and keeping track of what medications you are taking and when is it that you will need a refill. The day before your appointment: write a list of all prescriptions that need to be refilled. The day of the appointment: give the list to the admitting nurse. Prescriptions will be written only  during appointments. No prescriptions will be written on procedure days. If you forget a  medication: it will not be "Called in", "Faxed", or "electronically sent". You will need to get another appointment to get these prescribed. No early refills. Do not call asking to have your prescription filled early. Partial  or short prescriptions: Occasionally your pharmacy may not have enough pills to fill your prescription.  NEVER ACCEPT a partial fill or a prescription that is short of the total amount of pills that you were prescribed.  With controlled substances the law allows 72 hours for the pharmacy to complete the prescription.  If the prescription is not completed within 72 hours, the pharmacist will require a new prescription to be written. This means that you will be short on your medicine and we WILL NOT send another prescription to complete your original prescription.  Instead, request the pharmacy to send a carrier to a nearby branch to get enough medication to provide you with your full prescription. Prescription Accuracy: You are responsible for carefully inspecting your prescriptions before leaving our office. Have the discharge nurse carefully go over each prescription with you, before taking them home. Make sure that your name is accurately spelled, that your address is correct. Check the name and dose of your medication to make sure it is accurate. Check the number of pills, and the written instructions to make sure they are clear and accurate. Make sure that you are given enough medication to last until your next medication refill appointment. Taking Medication: Take medication as prescribed. When it comes to controlled substances, taking less pills or less frequently than prescribed is permitted and encouraged. Never take more pills than instructed. Never take the medication more frequently than prescribed.  Inform other Doctors: Always inform, all of your healthcare providers, of all the  medications you take. Pain Medication from other Providers: You are not allowed to accept any additional pain medication from any other Doctor or Healthcare provider. There are two exceptions to this rule. (see below) In the event that you require additional pain medication, you are responsible for notifying us, as stated below. Cough Medicine: Often these contain an opioid, such as codeine or hydrocodone. Never accept or take cough medicine containing these opioids if you are already taking an opioid* medication. The combination may cause respiratory failure and death. Medication Agreement: You are responsible for carefully reading and following our Medication Agreement. This must be signed before receiving any prescriptions from our practice. Safely store a copy of your signed Agreement. Violations to the Agreement will result in no further prescriptions. (Additional copies of our Medication Agreement are available upon request.) Laws, Rules, & Regulations: All patients are expected to follow all [address] and [employer], [employer], Rules, [place] [place]. Ignorance of the Laws does not constitute a valid excuse.  Illegal drugs and Controlled Substances: The use of illegal substances (including, but not limited to marijuana and its derivatives) and/or the illegal use of any controlled substances is strictly prohibited. Violation of this rule may result in the immediate and permanent discontinuation of any and all prescriptions being written by our practice. The use of any illegal substances is prohibited. Adopted CDC guidelines & recommendations: Target dosing levels will be at or below 60 MME/day. Use of benzodiazepines** is not recommended.  Exceptions: There are only two exceptions to the rule of not receiving pain medications from other Healthcare Providers. Exception #1 (Emergencies): In the event of an emergency (i.e.: accident requiring emergency care), you are allowed to receive additional pain  medication. However, you are responsible for: As soon as  you are able, call our office [phone], at any time of the day or night, and leave a message stating your name, the date and nature of the emergency, and the name and dose of the medication prescribed. In the event that your call is answered by a member of our staff, make sure to document and save the date, time, and the name of the person that took your information.  Exception #2 (Planned Surgery): In the event that you are scheduled by another doctor or dentist to have any type of surgery or procedure, you are allowed (for a period no longer than 30 days), to receive additional pain medication, for the acute post-op pain. However, in this case, you are responsible for picking up a copy of our "Post-op Pain Management for Surgeons" handout, and giving it to your surgeon or dentist. This document is available at our office, and does not require an appointment to obtain it. Simply go to our office during business hours (Monday-Thursday from 8:00 AM to 4:00 PM) (Friday 8:00 AM to 12:00 Noon) or if you have a scheduled appointment with [place], prior to your surgery, and ask for it by name. In addition, you are responsible for: calling our office [phone], at any time of the day or night, and leaving a message stating your name, name of your surgeon, type of surgery, and date of procedure or surgery. Failure to comply with your responsibilities may result in termination of therapy involving the controlled substances. Medication Agreement Violation. Following the above rules, including your responsibilities will help you in avoiding a Medication Agreement Violation ("Breaking your Pain Medication Contract").  Consequences:  Not following the above rules may result in permanent discontinuation of medication prescription therapy.  *Opioid medications include: morphine, codeine, oxycodone, oxymorphone, hydrocodone, hydromorphone, meperidine, tramadol,  tapentadol, buprenorphine, fentanyl, methadone. **Benzodiazepine medications include: diazepam (Valium), alprazolam (Xanax), clonazepam (Klonopine), lorazepam (Ativan), clorazepate (Tranxene), chlordiazepoxide (Librium), estazolam (Prosom), oxazepam (Serax), temazepam (Restoril), triazolam (Halcion) (Last updated: 01/03/2022) ______________________________________________________________________    ______________________________________________________________________  Medication Recommendations and Reminders  Applies to: All patients receiving prescriptions (written and/or electronic).  Medication Rules & Regulations: You are responsible for reading, knowing, and following our "Medication Rules" document. These exist for your safety and that of others. They are not flexible and neither are we. Dismissing or ignoring them is an act of "non-compliance" that may result in complete and irreversible termination of such medication therapy. For safety reasons, "non-compliance" will not be tolerated. As with the [place] fundamental legal principle of "ignorance of the law is no defense", we will accept no excuses for not having read and knowing the content of documents provided to you by our practice.  Pharmacy of record:  Definition: This is the pharmacy where your electronic prescriptions will be sent.  We do not endorse any particular pharmacy. It is up to you and your insurance to decide what pharmacy to use.  We do not restrict you in your choice of pharmacy. However, once we write for your prescriptions, we will NOT be re-sending more prescriptions to fix restricted supply problems created by your pharmacy, or your insurance.  The pharmacy listed in the electronic medical record should be the one where you want electronic prescriptions to be sent. If you choose to change pharmacy, simply notify our nursing staff. Changes will be made only during your regular appointments and not over the  phone.  Recommendations: Keep all of your pain medications in a safe place, under lock and key, even  if you live alone. We will NOT replace lost, stolen, or damaged medication. We do not accept "Police Reports" as proof of medications having been stolen. After you fill your prescription, take 1 week's worth of pills and put them away in a safe place. You should keep a separate, properly labeled bottle for this purpose. The remainder should be kept in the original bottle. Use this as your primary supply, until it runs out. Once it's gone, then you know that you have 1 week's worth of medicine, and it is time to come in for a prescription refill. If you do this correctly, it is unlikely that you will ever run out of medicine. To make sure that the above recommendation works, it is very important that you make sure your medication refill appointments are scheduled at least 1 week before you run out of medicine. To do this in an effective manner, make sure that you do not leave the office without scheduling your next medication management appointment. Always ask the nursing staff to show you in your prescription , when your medication will be running out. Then arrange for the receptionist to get you a return appointment, at least 7 days before you run out of medicine. Do not wait until you have 1 or 2 pills left, to come in. This is very poor planning and does not take into consideration that we may need to cancel appointments due to bad weather, sickness, or emergencies affecting our staff. DO NOT ACCEPT A "Partial Fill": If for any reason your pharmacy does not have enough pills/tablets to completely fill or refill your prescription, do not allow for a "partial fill". The law allows the pharmacy to complete that prescription within 72 hours, without requiring a new prescription. If they do not fill the rest of your prescription within those 72 hours, you will need a separate prescription to fill the remaining  amount, which we will NOT provide. If the reason for the partial fill is your insurance, you will need to talk to the pharmacist about payment alternatives for the remaining tablets, but again, DO NOT ACCEPT A PARTIAL FILL, unless you can trust your pharmacist to obtain the remainder of the pills within 72 hours.  Prescription refills and/or changes in medication(s):  Prescription refills, and/or changes in dose or medication, will be conducted only during scheduled medication management appointments. (Applies to both, written and electronic prescriptions.) No refills on procedure days. No medication will be changed or started on procedure days. No changes, adjustments, and/or refills will be conducted on a procedure day. Doing so will interfere with the diagnostic portion of the procedure. No phone refills. No medications will be "called into the pharmacy". No Fax refills. No weekend refills. No Holliday refills. No after hours refills.  Remember:  Business hours are:  Monday to Thursday 8:00 AM to 4:00 PM Provider's Schedule: Delano Metz, MD - Appointments are:  Medication management: Monday and Wednesday 8:00 AM to 4:00 PM Procedure day: Tuesday and Thursday 7:30 AM to 4:00 PM Edward Jolly, MD - Appointments are:  Medication management: Tuesday and Thursday 8:00 AM to 4:00 PM Procedure day: Monday and Wednesday 7:30 AM to 4:00 PM (Last update: 01/03/2022) ______________________________________________________________________   ____________________________________________________________________________________________  Naloxone Nasal Spray  Why am I receiving this medication? [place] STOP ACT requires that all patients taking high dose opioids or at risk of opioids respiratory depression, be prescribed an opioid reversal agent, such as Naloxone (AKA: Narcan).  What is this medication? NALOXONE (  nal OX one) treats opioid overdose, which causes slow or shallow breathing,  severe drowsiness, or trouble staying awake. Call emergency services after using this medication. You may need additional treatment. Naloxone works by reversing the effects of opioids. It belongs to a group of medications called opioid blockers.  COMMON BRAND NAME(S): Kloxxado, Narcan  What should I tell my care team before I take this medication? They need to know if you have any of these conditions: Heart disease Substance use disorder An unusual or allergic reaction to naloxone, other medications, foods, dyes, or preservatives Pregnant or trying to get pregnant Breast-feeding  When to use this medication? This medication is to be used for the treatment of respiratory depression (less than 8 breaths per minute) secondary to opioid overdose.   How to use this medication? This medication is for use in the nose. Lay the person on their back. Support their neck with your hand and allow the head to tilt back before giving the medication. The nasal spray should be given into 1 nostril. After giving the medication, move the person onto their side. Do not remove or test the nasal spray until ready to use. Get emergency medical help right away after giving the first dose of this medication, even if the person wakes up. You should be familiar with how to recognize the signs and symptoms of a narcotic overdose. If more doses are needed, give the additional dose in the other nostril. Talk to your care team about the use of this medication in children. While this medication may be prescribed for children as young as newborns for selected conditions, precautions do apply.  Naloxone Overdosage: If you think you have taken too much of this medicine contact a poison control center or emergency room at once.  NOTE: This medicine is only for you. Do not share this medicine with others.  What if I miss a dose? This does not apply.  What may interact with this medication? This is only used during an  emergency. No interactions are expected during emergency use. This list may not describe all possible interactions. Give your health care provider a list of all the medicines, herbs, non-prescription drugs, or dietary supplements you use. Also tell them if you smoke, drink alcohol, or use illegal drugs. Some items may interact with your medicine.  What should I watch for while using this medication? Keep this medication ready for use in the case of an opioid overdose. Make sure that you have the phone number of your care team and local hospital ready. You may need to have additional doses of this medication. Each nasal spray contains a single dose. Some emergencies may require additional doses. After use, bring the treated person to the nearest hospital or call 911. Make sure the treating care team knows that the person has received a dose of this medication. You will receive additional instructions on what to do during and after use of this medication before an emergency occurs.  What side effects may I notice from receiving this medication? Side effects that you should report to your care team as soon as possible: Allergic reactions--skin rash, itching, hives, swelling of the face, lips, tongue, or throat Side effects that usually do not require medical attention (report these to your care team if they continue or are bothersome): Constipation Dryness or irritation inside the nose Headache Increase in blood pressure Muscle spasms Stuffy nose Toothache This list may not describe all possible side effects. Call your doctor for  medical advice about side effects. You may report side effects to FDA at 1-800-FDA-1088.  Where should I keep my medication? Because this is an emergency medication, you should keep it with you at all times.  Keep out of the reach of children and pets. Store between 20 and 25 degrees C (68 and 77 degrees F). Do not freeze. Throw away any unused medication after the  expiration date. Keep in original box until ready to use.  NOTE: This sheet is a summary. It may not cover all possible information. If you have questions about this medicine, talk to your doctor, pharmacist, or health care provider.   2023 [employer]/Gold Standard (2020-11-05 00:00:00)  ____________________________________________________________________________________________

## 2022-12-08 NOTE — Progress Notes (Unsigned)
PROVIDER NOTE: Information contained herein reflects review and annotations entered in association with encounter. Interpretation of such information and data should be left to medically-trained personnel. Information provided to patient can be located elsewhere in the medical record under "Patient Instructions". Document created using STT-dictation technology, any transcriptional errors that may result from process are unintentional.    Patient: [name]  Service Category: E/M  Provider: Oswaldo Done, MD  DOB: Jan 31, 1961  DOS: 12/13/2022  Referring Provider: Erasmo Downer, MD  MRN: [id number]  Specialty: Interventional Pain Management  PCP: Erasmo Downer, MD  Type: Established Patient  Setting: Ambulatory outpatient    Location: Office  Delivery: Face-to-face     HPI  [name], a 62 y.o. year old female, is here today because of her Chronic pain syndrome [G89.4]. [name] primary complain today is Shoulder Pain (left)  Pertinent problems: 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); Chronic hip pain (2ry area of Pain) (Bilateral) (L>R); Chronic shoulder arthropathy (Left); Trigger point of shoulder region (Left); Enthesopathy of hip region (Left); Groin pain, chronic,  left; Other intervertebral disc degeneration, lumbar region; History of lumbar surgery; DDD (degenerative disc disease), lumbosacral; Neuropathic pain; Neurogenic pain; Spondylosis without myelopathy or radiculopathy, lumbosacral region; Chronic musculoskeletal pain; Cervical radiculitis (C4,C5,C8) (Left); DDD (degenerative disc disease), cervical; Foraminal stenosis of cervical region (C3-4) (Right); 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); Abnormal MRI, shoulder (Left: 03/20/2017) (Right: 12/11/2020); Chronic neck pain with history of cervical spinal surgery; Enthesopathy of shoulder (Left); Osteoarthritis of shoulder (Left); Symptoms referable to shoulder joint; Tendinopathy of rotator cuff (Left); Sprain of supraspinatus muscle or tendon, sequela (Left); Subdeltoid bursitis of shoulder (Left); Subacromial bursitis of shoulder (Left); Chronic shoulder pain (Left); Abnormal MRI, cervical spine (05/12/2020); Chronic upper back pain; Chronic shoulder pain (Right); Osteoarthritis of shoulder (Right); Arthropathy of shoulder (Right); C7 radiculopathy (Right); Cervical radiculopathy at C8 (Right); Muscle weakness of upper extremity (Right); Weakness of upper extremity (Right); Cervical fusion syndrome; Failed back syndrome of cervical spine; Cervicalgia; Chronic hip pain after total replacement (THR) (Right); Fall (06/13/2022); and Acute left-sided low back pain without sciatica on their pertinent problem list. Pain Assessment: Severity of Chronic pain is reported as a 3 /10. Location: Shoulder Left/Denies. Onset: More than a month ago. Quality: Aching, Constant, Throbbing. Timing: Constant. Modifying factor(s): Meds. Vitals:  height is 5\' 3"  (1.6 m) and weight is 148 lb 9.6 oz (67.4 kg). Her temperature is 97.3 F (36.3 C) (abnormal). Her blood pressure is 125/85 and her Cooley is 70. Her respiration is 18 and oxygen saturation is 100%.  BMI: Estimated body mass index is 26.32 kg/m as calculated  from the following:   Height as of this encounter: 5\' 3"  (1.6 m).   Weight as of this encounter: 148 lb 9.6 oz (67.4 kg). Last encounter: 08/30/2022. Last procedure: 06/06/2022.  Reason for encounter: medication management.  The patient indicates doing well with the current medication regimen.  No adverse reactions or side effects reported to the medications.  The patient indicates that she was unable to do her shoulder MRI because even though we had schedule her for an open MRI, by the time she arrived to [place], apparently they fitted her in a regular MRI.  She is extremely claustrophobic and despite the fact that she had been given some oral anxiolytics, she describes having had a bad experience where the MRI technicians did not seem to sympathize with her situation rather than trying to get the MRI done as soon as possible, she described that they stood there talking about their upcoming vacation.  Although I really do not have any control over this, I have apologized to the patient for the situation.  As expected, due to her anxiety, claustrophobia, and PTSD she was unable to get the MRI done.  At this point, I will not be ordering another one since she is traumatized from the event.  RTCB: 03/18/2023   Pharmacotherapy Assessment  Analgesic: Morphine IR 15 mg 1 tab p.o. every 6 hours (60 mg/day of morphine) MME/day: 60 mg/day.   Monitoring: [place] PMP: PDMP reviewed during this encounter.       Pharmacotherapy: No side-effects or adverse reactions reported. Compliance: No problems identified. Effectiveness: Clinically acceptable.  Shelby Pulse, RN  12/13/2022  9:49 AM  Sign when Signing Visit Nursing Pain Medication Assessment:  Safety precautions to be maintained throughout the outpatient stay will include: orient to surroundings, keep bed in low position, maintain call bell within reach at all times, provide assistance with transfer out of bed and ambulation.  Medication Inspection  Compliance: Pill count conducted under aseptic conditions, in front of the patient. Neither the pills nor the bottle was removed from the patient's sight at any time. Once count was completed pills were immediately returned to the patient in their original bottle.  Medication: Morphine IR Pill/Patch Count:  34 of 120 pills remain Pill/Patch Appearance: Markings consistent with prescribed medication Bottle Appearance: Standard pharmacy container. Clearly labeled. Filled Date: 10 / 9 / 2024 Last Medication intake:  TodaySafety precautions to be maintained throughout the outpatient stay will include: orient to surroundings, keep bed in low position, maintain call bell within reach at all times, provide assistance with transfer out of bed and ambulation.     No results found for: "CBDTHCR" No results found for: "D8THCCBX" No results found for: "D9THCCBX"  UDS:  Summary  Date Value Ref Range Status  08/30/2022 Note  Final    Comment:    ==================================================================== [employer] [facility] (MW) ==================================================================== Test                             Result       Flag       Units  Drug Present and Declared for Prescription Verification   Desmethyldiazepam              60           EXPECTED   ng/mg creat   Oxazepam                       222          EXPECTED   ng/mg creat   Temazepam                      117  EXPECTED   ng/mg creat    Desmethyldiazepam, oxazepam, and temazepam are expected metabolites    of diazepam. Desmethyldiazepam and oxazepam are also expected    metabolites of other drugs, including chlordiazepoxide, prazepam,    clorazepate, and halazepam. Oxazepam is an expected metabolite of    temazepam. Oxazepam and temazepam are also available as scheduled    prescription medications.    Alprazolam                     353          EXPECTED   ng/mg creat   Alpha-hydroxyalprazolam        778           EXPECTED   ng/mg creat    Source of alprazolam is a scheduled prescription medication. Alpha-    hydroxyalprazolam is an expected metabolite of alprazolam.    Morphine                       >[id number]       EXPECTED   ng/mg creat   Normorphine                    1386         EXPECTED   ng/mg creat    Potential sources of large amounts of morphine in the absence of    codeine include administration of morphine or use of heroin.     Normorphine is an expected metabolite of morphine.    Hydromorphone                  244          EXPECTED   ng/mg creat    Hydromorphone may be present as a metabolite of morphine;    concentrations of hydromorphone rarely exceed 5% of the morphine    concentration when this is the source of hydromorphone.  ==================================================================== Test                      Result    Flag   Units      Ref Range   Creatinine              77               mg/dL      >=60 ==================================================================== Declared Medications:  The flagging and interpretation on this report are based on the  following declared medications.  Unexpected results may arise from  inaccuracies in the declared medications.   **Note: The testing scope of this panel includes these medications:   Alprazolam (Xanax)  Diazepam (Valium)  Morphine (MSIR)   **Note: The testing scope of this panel does not include the  following reported medications:   Acetaminophen (Tylenol)  Apixaban (Eliquis)  Dexlansoprazole (Dexilant)  Diltiazem (Cardizem)  Estradiol (Estrace)  Flecainide (Tambocor)  Linaclotide (Linzess)  Montelukast (Singulair)  Naloxone (Narcan)  Valacyclovir (Valtrex) ==================================================================== For clinical consultation, please call [phone]. ====================================================================       ROS  Constitutional: Denies any fever  or chills Gastrointestinal: No reported hemesis, hematochezia, vomiting, or acute GI distress Musculoskeletal: Denies any acute onset joint swelling, redness, loss of ROM, or weakness Neurological: No reported episodes of acute onset apraxia, aphasia, dysarthria, agnosia, amnesia, paralysis, loss of coordination, or loss of consciousness  Medication Review  ALPRAZolam, Mometasone Furoate, acetaminophen, apixaban, colchicine, dexlansoprazole, diazepam, diltiazem, flecainide, linaclotide, mirtazapine, montelukast, morphine, naloxone, and valACYclovir  History Review  Allergy: [name] is allergic to amoxicillin, chlorhexidine gluconate, clindamycin, codeine, erythromycin, penicillin g, sulfa antibiotics, levofloxacin, shellfish allergy, decadron [dexamethasone], flecainide, mangifera indica, papaya derivatives, betadine [povidone iodine], clarithromycin, other, povidone-iodine, and prednisone. Drug: [name]  reports current drug use. Alcohol:  reports no history of alcohol use. Tobacco:  reports that she has never smoked. She has never used smokeless tobacco. Social: Ms. Coor  reports that she has never smoked. She has never used smokeless tobacco. She reports current drug use. She reports that she does not drink alcohol. 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), Anemia, Anxiety, Asthma, Cephalalgia (08/18/2014), Cervical disc disease (08/18/2014), Chronic headaches, Chronic, continuous use of opioids, DDD (degenerative disc disease), Depression, Dissociative disorder, Dizziness (04/22/2013), Duodenal ulcer with hemorrhage and perforation (HCC) (04/27/2003), Foot drop, right, GERD (gastroesophageal reflux disease), H/O arthrodesis (08/18/2014), Headache(784.0), History of blood transfusion, History of cardiac cath, History of cardioversion, History of cervical spinal surgery  (01/04/2015), History of kidney stones, Hypertension, Inverted T wave, Iron deficiency (02/01/2021), Mitral regurgitation, Narrowing of intervertebral disc space (08/18/2014), Orthostatic hypotension (04/22/2013), Pain, Paroxysmal atrial fibrillation (HCC) (2021), Pneumonia, PONV (postoperative nausea and vomiting), Postop Hyponatremia (05/14/2012), Postoperative anemia due to acute blood loss (05/14/2012), PTSD (post-traumatic stress disorder), Right foot drop, Right hip arthralgia (08/18/2014), Sleep apnea (2021), Therapeutic opioid-induced constipation (OIC), and Typical atrial flutter (HCC). Surgical: [name]  has a past surgical history that includes RIGHT HIP ARTHROSCOPY FOR LABRAL TEAR (ABOUT 2010); Anterior fusion cervical spine (MAY 2012); Nasal septum surgery [facility] 2013); Back surgery (2009); Tonsillectomy; Abdominal hysterectomy; DIAGNOSTIC LAPAROSCOPIES - MULTIPLE FOR ENDOMETRIOSIS; Cholecystectomy; Hip arthroscopy (09/20/2011); Shoulder arthroscopy (05-06-12); Carpal tunnel release (05-06-12); Total hip arthroplasty (Right, 05/13/2012); Anterior cervical decomp/discectomy fusion (N/A, 10/30/2012); Posterior cervical fusion/foraminotomy (N/A, 04/16/2013); Breast reduction surgery (Bilateral, 06/2016); Colonoscopy with propofol (N/A, 01/26/2017); Esophagogastroduodenoscopy (egd) with propofol (N/A, 01/26/2017); Esophageal dilation (N/A, 01/26/2017); polypectomy (N/A, 01/26/2017); PH impedance study (N/A, 05/30/2017); Esophageal manometry (N/A, 05/30/2017); Radiofrequency ablation nerves; afib (05/2019); LEFT HEART CATH AND CORONARY ANGIOGRAPHY (Left, 06/10/2019); Cardioversion (N/A, 12/03/2019); Esophagogastroduodenoscopy (egd) with propofol (N/A, 01/30/2020); Reduction mammaplasty (Bilateral, 05/2016); Breast biopsy (Right, 2008); Breast excisional biopsy (Left, 1998); TEE without cardioversion (N/A, 08/05/2020); ATRIAL FIBRILLATION ABLATION (N/A, 08/06/2020); Cardiac electrophysiology mapping and ablation;  Esophagogastroduodenoscopy (egd) with propofol (N/A, 01/15/2021); Colonoscopy with propofol (N/A, 01/16/2021); Cardioversion (N/A, 08/01/2021); Cardioversion (N/A, 08/05/2021); Esophagogastroduodenoscopy (egd) with propofol (N/A, 12/07/2021); Lower Extremity Angiography (Left, 01/06/2022); and ATRIAL FIBRILLATION ABLATION (N/A, 12/04/2022). Family: family history includes Aneurysm in her maternal grandmother and mother; Anxiety disorder in her grandchild; Bipolar disorder in her grandchild and sister; Breast cancer (age of onset: 23) in her paternal grandmother; Depression in her grandchild.  Laboratory Chemistry Profile   Renal Lab Results  Component Value Date   BUN 5 (L) 12/04/2022   CREATININE 0.69 12/04/2022   BCR 9 (L) 01/19/2021   GFRAA >60 11/26/2019   GFRNONAA >60 12/04/2022    Hepatic Lab Results  Component Value Date   AST 12 (L) 01/04/2022   ALT 10 01/04/2022   ALBUMIN 3.3 (L) 01/04/2022   ALKPHOS 87 01/04/2022   LIPASE 26 08/08/2021    Electrolytes Lab Results  Component Value Date   NA 136 12/04/2022   K 3.4 (L) 12/04/2022   CL 103 12/04/2022   CALCIUM 9.0 12/04/2022   MG 2.3 08/08/2021    Bone Lab Results  Component Value Date   25OHVITD1 23 (L) 11/11/2018  25OHVITD2 9.8 11/11/2018   25OHVITD3 13 11/11/2018    Inflammation (CRP: Acute Phase) (ESR: Chronic Phase) Lab Results  Component Value Date   CRP 12 (H) 11/11/2018   ESRSEDRATE 42 (H) 11/11/2018   LATICACIDVEN 1.0 01/14/2021         Note: Above Lab results reviewed.  Recent Imaging Review  EP STUDY CONCLUSIONS: 1. Successful redo PVI (reisolation of the RIPV) 2. Successful redo ablation of the posterior wall (outside the RIPV) 3. Successful redo ablation of atypical atrial flutter using a lateral  mitral isthmus line with bidirectional block achieved 4. Intracardiac echo reveals trivial pericardial effusion, normal LA  architecture 5. No early apparent complications. 6. Colchicine 0.6mg  PO BID x  5 days 7. Protonix 40mg  PO daily x 45 days Note: Reviewed        Physical Exam  General appearance: Well nourished, well developed, and well hydrated. In no apparent acute distress Mental status: Alert, oriented x 3 (person, place, & time)       Respiratory: No evidence of acute respiratory distress Eyes: PERLA Vitals: BP 125/85 (BP Location: Right Arm, Patient Position: Sitting, Cuff Size: Normal)   Cooley 70   Temp (!) 97.3 F (36.3 C)   Resp 18   Ht 5\' 3"  (1.6 m)   Wt 148 lb 9.6 oz (67.4 kg)   SpO2 100%   BMI 26.32 kg/m  BMI: Estimated body mass index is 26.32 kg/m as calculated from the following:   Height as of this encounter: 5\' 3"  (1.6 m).   Weight as of this encounter: 148 lb 9.6 oz (67.4 kg). Ideal: Ideal body weight: 52.4 kg (115 lb 8.3 oz) Adjusted ideal body weight: 58.4 kg (128 lb 12 oz)  Assessment   Diagnosis Status  1. Chronic pain syndrome   2. Chronic low back pain (1ry area of Pain) (Bilateral) (L>R) w/o sciatica   3. Chronic hip pain (2ry area of Pain) (Bilateral) (L>R)   4. Chronic shoulder pain (3ry area of Pain) (Bilateral) (L>R)   5. Chronic neck pain with history of cervical spinal surgery   6. Cervicalgia   7. Chronic upper back pain   8. Lumbar facet syndrome (Bilateral) (L>R)   9. Failed back syndrome of cervical spine   10. 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 Pharmacologic therapy   12. Chronic use of opiate for therapeutic purpose   13. Encounter for medication management   14. Encounter for chronic pain management   15. Anxiety state   16. History of claustrophobia   17. PTSD (post-traumatic stress disorder)    Controlled Controlled Controlled   Updated Problems: Problem  History of Claustrophobia  Ptsd (Post-Traumatic Stress Disorder)    Plan of Care  Problem-specific:  No problem-specific Assessment & Plan notes found for this encounter.  [name] has a current medication list which  includes the following long-term medication(s): dexlansoprazole, diltiazem, eliquis, linzess, mometasone furoate, montelukast, [START ON 12/18/2022] morphine, [START ON 01/17/2023] morphine, [START ON 02/16/2023] morphine, and colchicine.  Pharmacotherapy (Medications Ordered): Meds ordered this encounter  Medications   naloxone (NARCAN) nasal spray 4 mg/0.1 mL    Sig: Place 1 spray into the nose as needed for up to 365 doses (for opioid-induced respiratory depresssion). In case of emergency (overdose), spray once into each nostril. If no response within 3 minutes, repeat application and call 911.    Dispense:  1 each    Refill:  0    Instruct  patient in proper use of device.   morphine (MSIR) 15 MG tablet    Sig: Take 1 tablet (15 mg total) by mouth every 6 (six) hours as needed for moderate pain or severe pain. Must last 30 days.    Dispense:  120 tablet    Refill:  0    DO NOT: delete (not duplicate); no partial-fill (will deny script to complete), no refill request (F/U required). DISPENSE: 1 day early if closed on fill date. WARN: No CNS-depressants within 8 hrs of med.   morphine (MSIR) 15 MG tablet    Sig: Take 1 tablet (15 mg total) by mouth every 6 (six) hours as needed for moderate pain or severe pain. Must last 30 days.    Dispense:  120 tablet    Refill:  0    DO NOT: delete (not duplicate); no partial-fill (will deny script to complete), no refill request (F/U required). DISPENSE: 1 day early if closed on fill date. WARN: No CNS-depressants within 8 hrs of med.   morphine (MSIR) 15 MG tablet    Sig: Take 1 tablet (15 mg total) by mouth every 6 (six) hours as needed for moderate pain or severe pain. Must last 30 days.    Dispense:  120 tablet    Refill:  0    DO NOT: delete (not duplicate); no partial-fill (will deny script to complete), no refill request (F/U required). DISPENSE: 1 day early if closed on fill date. WARN: No CNS-depressants within 8 hrs of med.   Orders:  No  orders of the defined types were placed in this encounter.  Follow-up plan:   Return in about 3 months (around 03/18/2023) for Eval-day (M,W), (F2F), (MM).      Interventional Therapies  Risk Factors  Considerations:   ELIQUIS ANTICOAGULATION: (Stop: 3 days  Restart: 6 hours) ALLERGY: CONTRAST; shellfish; STEROIDS (dexamethasone, prednisone); Iodine HARDWARE: Not a candidate for RFA or SCS (cervical or lumbar). Patient not interested in IT pump (due to surgical risks). Concomitant BENZO therapy.  (At risk of respiratory depression) A-fib/flutter; unstable angina; CAD; (+) Cardiac cath; (+) cardioversion; mitral regurgitation Multiple allergies; OSA; GERD; IBS; OIC Depression; dissociative disorder; PTSD        Planned  Pending:   Left shoulder MRI and PT ordered today (08/30/2022)   Under consideration:   Diagnostic right suprascapular NB #1 (without steroids)  Possible left cervical ESI #1 (With steroids, if suprascapular nerve RFA does not eliminate all of the pain in the left shoulder.)   Completed (No Steroids):   Diagnostic bilateral lumbar facet MBB x2 (No Steroids)) (01/14/2019) (100/100/100/0)  Therapeutic bilateral suprascapular NB x2 (No Steroids) (06/19/2016) (80/80/0)  Therapeutic left suprascapular nerve RFA x3 (05/13/2020) (100/100/75/0)    Therapeutic  Palliative (PRN) options:   Diagnostic bilateral lumbar facet MBB #2  Palliative bilateral suprascapular NB #3 (No Steroids) Palliative left suprascapular nerve RFA #2    Pharmacotherapy  Failed trial of membrane stabilizers (Neurontin/Lyrica).       Recent Visits No visits were found meeting these conditions. Showing recent visits within past 90 days and meeting all other requirements Today's Visits Date Type Provider Dept  12/13/22 Office Visit Delano Metz, MD [facility]  Showing today's visits and meeting all other requirements Future Appointments Date Type Provider Dept  03/12/23  Appointment Delano Metz, MD [facility]  Showing future appointments within next 90 days and meeting all other requirements  I discussed the assessment and treatment plan with the patient.  The patient was provided an opportunity to ask questions and all were answered. The patient agreed with the plan and demonstrated an understanding of the instructions.  Patient advised to call back or seek an in-person evaluation if the symptoms or condition worsens.  Duration of encounter: 30 minutes.  Total time on encounter, as per AMA guidelines included both the face-to-face and non-face-to-face time personally spent by the physician and/or other qualified health care professional(s) on the day of the encounter (includes time in activities that require the physician or other qualified health care professional and does not include time in activities normally performed by clinical staff). Physician's time may include the following activities when performed: Preparing to see the patient (e.g., pre-charting review of records, searching for previously ordered imaging, lab work, and nerve conduction tests) Review of prior analgesic pharmacotherapies. Reviewing PMP Interpreting ordered tests (e.g., lab work, imaging, nerve conduction tests) Performing post-procedure evaluations, including interpretation of diagnostic procedures Obtaining and/or reviewing separately obtained history Performing a medically appropriate examination and/or evaluation Counseling and educating the patient/family/caregiver Ordering medications, tests, or procedures Referring and communicating with other health care professionals (when not separately reported) Documenting clinical information in the electronic or other health record Independently interpreting results (not separately reported) and communicating results to the patient/ family/caregiver Care coordination (not separately reported)  Note by: Oswaldo Done, MD Date: 12/13/2022; Time: 10:29 AM

## 2022-12-13 ENCOUNTER — Ambulatory Visit: Payer: Medicare Other | Attending: Pain Medicine | Admitting: Pain Medicine

## 2022-12-13 ENCOUNTER — Encounter: Payer: Self-pay | Admitting: Pain Medicine

## 2022-12-13 VITALS — BP 125/85 | HR 70 | Temp 97.3°F | Resp 18 | Ht 63.0 in | Wt 148.6 lb

## 2022-12-13 DIAGNOSIS — Z9889 Other specified postprocedural states: Secondary | ICD-10-CM | POA: Insufficient documentation

## 2022-12-13 DIAGNOSIS — M542 Cervicalgia: Secondary | ICD-10-CM | POA: Diagnosis not present

## 2022-12-13 DIAGNOSIS — Z8659 Personal history of other mental and behavioral disorders: Secondary | ICD-10-CM | POA: Diagnosis present

## 2022-12-13 DIAGNOSIS — M545 Low back pain, unspecified: Secondary | ICD-10-CM | POA: Diagnosis not present

## 2022-12-13 DIAGNOSIS — M25552 Pain in left hip: Secondary | ICD-10-CM | POA: Diagnosis not present

## 2022-12-13 DIAGNOSIS — G8928 Other chronic postprocedural pain: Secondary | ICD-10-CM

## 2022-12-13 DIAGNOSIS — M25511 Pain in right shoulder: Secondary | ICD-10-CM | POA: Insufficient documentation

## 2022-12-13 DIAGNOSIS — G894 Chronic pain syndrome: Secondary | ICD-10-CM | POA: Diagnosis not present

## 2022-12-13 DIAGNOSIS — F411 Generalized anxiety disorder: Secondary | ICD-10-CM

## 2022-12-13 DIAGNOSIS — M549 Dorsalgia, unspecified: Secondary | ICD-10-CM | POA: Insufficient documentation

## 2022-12-13 DIAGNOSIS — M25551 Pain in right hip: Secondary | ICD-10-CM | POA: Diagnosis not present

## 2022-12-13 DIAGNOSIS — Z79891 Long term (current) use of opiate analgesic: Secondary | ICD-10-CM

## 2022-12-13 DIAGNOSIS — M47816 Spondylosis without myelopathy or radiculopathy, lumbar region: Secondary | ICD-10-CM

## 2022-12-13 DIAGNOSIS — M961 Postlaminectomy syndrome, not elsewhere classified: Secondary | ICD-10-CM

## 2022-12-13 DIAGNOSIS — Z79899 Other long term (current) drug therapy: Secondary | ICD-10-CM | POA: Diagnosis not present

## 2022-12-13 DIAGNOSIS — G8929 Other chronic pain: Secondary | ICD-10-CM

## 2022-12-13 DIAGNOSIS — F4310 Post-traumatic stress disorder, unspecified: Secondary | ICD-10-CM | POA: Diagnosis present

## 2022-12-13 DIAGNOSIS — M25512 Pain in left shoulder: Secondary | ICD-10-CM | POA: Diagnosis not present

## 2022-12-13 MED ORDER — MORPHINE SULFATE 15 MG PO TABS: 15.0000 mg | ORAL_TABLET | Freq: Four times a day (QID) | ORAL | 0 refills | Status: DC | PRN

## 2022-12-13 MED ORDER — NALOXONE HCL 4 MG/0.1ML NA LIQD: 1.0000 | NASAL | 0 refills | Status: AC | PRN

## 2022-12-13 NOTE — Progress Notes (Signed)
Nursing Pain Medication Assessment:  Safety precautions to be maintained throughout the outpatient stay will include: orient to surroundings, keep bed in low position, maintain call bell within reach at all times, provide assistance with transfer out of bed and ambulation.  Medication Inspection Compliance: Pill count conducted under aseptic conditions, in front of the patient. Neither the pills nor the bottle was removed from the patient's sight at any time. Once count was completed pills were immediately returned to the patient in their original bottle.  Medication: Morphine IR Pill/Patch Count:  34 of 120 pills remain Pill/Patch Appearance: Markings consistent with prescribed medication Bottle Appearance: Standard pharmacy container. Clearly labeled. Filled Date: 42 / 9 / 2024 Last Medication intake:  TodaySafety precautions to be maintained throughout the outpatient stay will include: orient to surroundings, keep bed in low position, maintain call bell within reach at all times, provide assistance with transfer out of bed and ambulation.

## 2022-12-18 ENCOUNTER — Other Ambulatory Visit: Payer: Medicare Other

## 2022-12-18 DIAGNOSIS — L728 Other follicular cysts of the skin and subcutaneous tissue: Secondary | ICD-10-CM | POA: Diagnosis not present

## 2022-12-18 DIAGNOSIS — L720 Epidermal cyst: Secondary | ICD-10-CM | POA: Diagnosis not present

## 2022-12-18 DIAGNOSIS — D485 Neoplasm of uncertain behavior of skin: Secondary | ICD-10-CM | POA: Diagnosis not present

## 2022-12-26 ENCOUNTER — Ambulatory Visit: Payer: Self-pay

## 2022-12-26 ENCOUNTER — Encounter: Payer: Self-pay | Admitting: Family Medicine

## 2022-12-26 ENCOUNTER — Ambulatory Visit: Payer: Medicare Other | Admitting: Family Medicine

## 2022-12-26 VITALS — BP 116/70 | HR 82 | Resp 16 | Ht 63.0 in | Wt 149.0 lb

## 2022-12-26 DIAGNOSIS — N390 Urinary tract infection, site not specified: Secondary | ICD-10-CM

## 2022-12-26 DIAGNOSIS — R300 Dysuria: Secondary | ICD-10-CM | POA: Diagnosis not present

## 2022-12-26 DIAGNOSIS — R319 Hematuria, unspecified: Secondary | ICD-10-CM | POA: Diagnosis not present

## 2022-12-26 LAB — POCT URINALYSIS DIPSTICK
Bilirubin, UA: NEGATIVE
Glucose, UA: NEGATIVE
Ketones, UA: NEGATIVE
Nitrite, UA: NEGATIVE
Protein, UA: NEGATIVE
Spec Grav, UA: 1.02 (ref 1.010–1.025)
Urobilinogen, UA: 0.2 U/dL
pH, UA: 6 (ref 5.0–8.0)

## 2022-12-26 MED ORDER — CEPHALEXIN 500 MG PO CAPS
500.0000 mg | ORAL_CAPSULE | Freq: Two times a day (BID) | ORAL | 0 refills | Status: AC
Start: 2022-12-26 — End: 2023-01-02

## 2022-12-26 MED ORDER — PHENAZOPYRIDINE HCL 100 MG PO TABS
100.0000 mg | ORAL_TABLET | Freq: Three times a day (TID) | ORAL | 0 refills | Status: DC | PRN
Start: 2022-12-26 — End: 2022-12-29

## 2022-12-26 MED ORDER — ONDANSETRON 4 MG PO TBDP: 4.0000 mg | ORAL_TABLET | Freq: Three times a day (TID) | ORAL | 0 refills | Status: DC | PRN

## 2022-12-26 NOTE — Progress Notes (Signed)
Patient ID: [name], female    DOB: 1961-03-01, 62 y.o.   MRN: [id number]  PCP: Erasmo Downer, MD  Chief Complaint  Patient presents with   Urinary Tract Infection    Subjective:   [name] is a 62 y.o. female, presents to clinic with CC of the following:  HPI  Patient presents with symptoms concerning for UTI which she has a history of she reports burning with urination, slight odor, suprapubic pain and generally feeling ill and nauseated.  She has many years history of UTIs, thankfully they have been less frequent in the past couple years.  She also reports having pelvic floor dysfunction and bladder issues, she states she is only able to produce small amounts of urine at a time.  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/2022   High risk medication use 12/14/2021   At risk for respiratory depression due to opioid 12/14/2021   Chronic hip pain after total replacement (THR) (Right) 12/14/2021   Cervicalgia 09/10/2021   Nausea & vomiting 08/10/2021   Chronic constipation 08/04/2021   Atrial flutter (HCC)    Atrial fibrillation (HCC) 07/30/2021   Diarrhea 07/28/2021   Iron deficiency 02/01/2021   Non-healing skin lesion 01/18/2021   Epigastric abdominal pain    Ileus (HCC) 01/14/2021   Atrial fibrillation with RVR (HCC)    Enteritis    Chest pain    Chronic shoulder pain (Right) 11/30/2020    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   Arthropathy of shoulder (Right) 11/30/2020   C7 radiculopathy (Right) 11/30/2020   Cervical radiculopathy at C8 (Right) 11/30/2020   Muscle weakness of upper extremity (Right) 11/30/2020   Weakness of upper extremity (Right) 11/30/2020   Cervical fusion syndrome 11/30/2020   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   Chronic use of opiate for therapeutic purpose 08/18/2020   Longstanding persistent atrial fibrillation (HCC) 06/30/2020   Abnormal MRI, cervical spine (05/12/2020) 06/24/2020   Chronic upper back pain 06/24/2020   Enthesopathy of shoulder (Left) 05/13/2020   Osteoarthritis of shoulder (Left) 05/13/2020   Symptoms referable to shoulder joint 05/13/2020   Tendinopathy of rotator cuff (Left) 05/13/2020   Sprain of supraspinatus muscle or tendon, sequela (Left) 05/13/2020   Subdeltoid bursitis of shoulder (Left) 05/13/2020   Subacromial bursitis of shoulder (Left) 05/13/2020   Chronic shoulder pain (Left) 05/13/2020   History of allergy to iodine 05/13/2020    Class: History of   Abnormal MRI, shoulder (Left: 03/20/2017) (Right: 12/11/2020) 04/21/2020   Chronic anticoagulation (Eliquis) 04/21/2020   Chronic neck pain with history of cervical spinal surgery 04/21/2020   Multiple allergies 04/21/2020   S/P repair of paraesophageal hernia 02/26/2020   Heartburn    Gastritis without bleeding    Typical atrial flutter (HCC)    Foraminal stenosis of cervical region (C3-4) (Right) 08/26/2019   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) 08/26/2019   DDD (degenerative disc disease), cervical 08/25/2019  Cervical radiculitis (C4,C5,C8) (Left) 08/20/2019   Pure hypercholesterolemia 06/27/2019   Constipation due to opioid therapy 06/27/2019   Snoring 06/27/2019   Unstable angina (HCC) 06/10/2019   Chronic musculoskeletal pain 01/14/2019   Hypertension 12/27/2018   Spondylosis without myelopathy or  radiculopathy, lumbosacral region 11/12/2018   History of allergy to radiographic contrast media 11/12/2018   History of allergy to shellfish 11/12/2018   DDD (degenerative disc disease), lumbosacral 07/31/2018   Neuropathic pain 07/31/2018   Neurogenic pain 07/31/2018   Vitamin D deficiency 05/28/2018   History of lumbar surgery 05/28/2018   Groin pain, chronic, left 05/20/2018   Other intervertebral disc degeneration, lumbar region 05/20/2018   Disorder of skeletal system 05/20/2018   Problems influencing health status 05/20/2018   Overweight 12/25/2017   Enthesopathy of hip region (Left) 07/13/2017   Trigger point of shoulder region (Left) 02/26/2017   Pharmacologic therapy    Polyp of sigmoid colon    Problems with swallowing and mastication    Chronic shoulder arthropathy (Left) 01/04/2017   Dysphagia 12/07/2016   Abnormal flushing and sweating 12/07/2016   Long term prescription benzodiazepine use 10/11/2016   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) 10/11/2016   Chronic hip pain (2ry area of Pain) (Bilateral) (L>R) 10/11/2016   Osteoarthritis of shoulder (Bilateral) (L>R) 05/17/2016   Cervicogenic headache 07/06/2015   History of total hip replacement (THR) (Right) 07/05/2015   Chronic shoulder radicular pain (Bilateral) (L>R) 07/05/2015   Lumbar facet syndrome (Bilateral) (L>R) 07/05/2015   Chronic low back pain (1ry area of Pain) (Bilateral) (L>R) w/o sciatica 04/05/2015   Chronic hip pain (Right) 01/04/2015   Encounter for therapeutic drug level monitoring 01/04/2015   Long term current use of opiate analgesic 01/04/2015   Long term prescription opiate use 01/04/2015   Opiate use 01/04/2015   Chronic pain syndrome 01/04/2015   Steroid intolerance 01/04/2015   Cervical spondylosis 01/04/2015   Cervical facet arthropathy (Bilateral) 01/04/2015   History of cervical spinal surgery 01/04/2015   Chronic shoulder pain (3ry area of Pain) (Bilateral) (L>R)  01/04/2015   Failed back surgical syndrome (surgery by Dr. Shon Baton) 01/04/2015   Epidural fibrosis 01/04/2015   Lumbar spondylosis 01/04/2015   Cervical facet syndrome (Bilateral) 01/04/2015   Chronic sacroiliac joint pain (Left) 01/04/2015   DOE (dyspnea on exertion) 09/03/2014   Asthma, mild 08/18/2014   Blurred vision 08/18/2014   Benign paroxysmal positional nystagmus 08/18/2014   Clinical depression 08/18/2014   Fatigue 08/18/2014   Chronic GERD 08/18/2014   Cannot sleep 08/18/2014   Palpitations 08/18/2014   RAD (reactive airway disease) 08/18/2014   OSA (obstructive sleep apnea) 08/18/2014   Orthostatic hypotension 04/22/2013   Lightheadedness 04/22/2013   Chronic neck pain 04/16/2013   Anxiety state 08/03/2003   Colon, diverticulosis 07/15/2003   IBS (irritable bowel syndrome) 04/27/2003   Esophagitis, reflux 02/11/2003   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 (Patient taking differently: Take 1,000 mg by mouth every 6 (six) hours as needed for mild pain (pain score 1-3) (or Fever >/= 101).), Disp: , Rfl:    ALPRAZolam (XANAX) 1 MG tablet, Take 1 mg by mouth at bedtime. , Disp: , Rfl:    cephALEXin (KEFLEX) 500 MG capsule, Take 1 capsule (500 mg total) by mouth 2 (two) times  daily for 7 days., Disp: 14 capsule, Rfl: 0   dexlansoprazole (DEXILANT) 60 MG capsule, TAKE 1 CAPSULE BY MOUTH EVERY DAY, Disp: 90 capsule, Rfl: 1   diazepam (VALIUM) 5 MG tablet, PLACE 1 TABLET VAGINALLY NIGHTLY AS NEEDED FOR MUSCLE SPASM/ PELVIC PAIN., Disp: 10 tablet, Rfl: 0   diltiazem (CARDIZEM CD) 240 MG 24 hr capsule, Take 240 mg by mouth daily., Disp: , Rfl:    ELIQUIS 5 MG TABS tablet, TAKE 1 TABLET BY MOUTH TWICE A DAY, Disp: 60 tablet, Rfl: 5   linaclotide (LINZESS) 290 MCG CAPS capsule, TAKE 1  CAPSULE BY MOUTH EVERY DAY BEFORE BREAKFAST, Disp: 30 capsule, Rfl: 1   mirtazapine (REMERON) 7.5 MG tablet, Take 7.5 mg by mouth at bedtime., Disp: , Rfl:    Mometasone Furoate (ALLERGY NASAL SPRAY NA), Place 1 spray into the nose daily as needed (allergies/ decongestion)., Disp: , Rfl:    montelukast (SINGULAIR) 10 MG tablet, TAKE 1 TABLET BY MOUTH EVERY DAY, Disp: 90 tablet, Rfl: 1   morphine (MSIR) 15 MG tablet, Take 1 tablet (15 mg total) by mouth every 6 (six) hours as needed for moderate pain or severe pain. Must last 30 days., Disp: 120 tablet, Rfl: 0   [START ON 01/17/2023] morphine (MSIR) 15 MG tablet, Take 1 tablet (15 mg total) by mouth every 6 (six) hours as needed for moderate pain or severe pain. Must last 30 days., Disp: 120 tablet, Rfl: 0   [START ON 02/16/2023] morphine (MSIR) 15 MG tablet, Take 1 tablet (15 mg total) by mouth every 6 (six) hours as needed for moderate pain or severe pain. Must last 30 days., Disp: 120 tablet, Rfl: 0   naloxone (NARCAN) nasal spray 4 mg/0.1 mL, Place 1 spray into the nose as needed for up to 365 doses (for opioid-induced respiratory depresssion). In case of emergency (overdose), spray once into each nostril. If no response within 3 minutes, repeat application and call 911., Disp: 1 each, Rfl: 0   ondansetron (ZOFRAN-ODT) 4 MG disintegrating tablet, Take 1 tablet (4 mg total) by mouth every 8 (eight) hours as needed for nausea or vomiting., Disp: 12 tablet, Rfl: 0   phenazopyridine (PYRIDIUM) 100 MG tablet, Take 1-2 tablets (100-200 mg total) by mouth 3 (three) times daily as needed for pain., Disp: 10 tablet, Rfl: 0   valACYclovir (VALTREX) 1000 MG tablet, TAKE TWO TABLETS BY MOUTH TWICE DAILY FOR ONE DAY FEVER BLISTER, Disp: 4 tablet, Rfl: 5   colchicine 0.6 MG tablet, Take 1 tablet (0.6 mg total) by mouth 2 (two) times daily for 5 days., Disp: 10 tablet, Rfl: 0   Allergies  Allergen Reactions   Amoxicillin Hives    She did ok w ANCEF    Chlorhexidine Gluconate Dermatitis and Hives   Clindamycin Hives   Codeine Hives   Erythromycin Hives    "mycins" in general   Penicillin G Hives    "cillins" in general   Sulfa Antibiotics Nausea And Vomiting and Hives        Levofloxacin Hives   Shellfish Allergy Hives   Decadron [Dexamethasone] Other (See Comments)    Hot flashes, insomnia, "manic" Hot flashes, insomnia, "manic"   Flecainide Swelling    Pts said she signs of going into heart failure. Adema   Mangifera Indica Hives    papaya   Papaya Derivatives Hives   Betadine [Povidone Iodine] Hives        Clarithromycin Hives   Other Hives  Mango    Povidone-Iodine Hives   Prednisone Anxiety    High blood pressure, flushed, mood changes, heart palpitations High blood pressure, flushed, mood changes, heart palpitations     Social History   Tobacco Use   Smoking status: Never   Smokeless tobacco: Never  Vaping Use   Vaping status: Never Used  Substance Use Topics   Alcohol use: No    Alcohol/week: 0.0 standard drinks of alcohol   Drug use: Yes    Comment: prescribed morphine and xanax      Chart Review Today: I personally reviewed active problem list, medication list, allergies, family history, social history, health maintenance, notes from last encounter, lab results, imaging with the patient/caregiver today.   Review of Systems  Constitutional:  Positive for appetite change and fatigue. Negative for activity change, chills, diaphoresis and fever.  HENT: Negative.    Eyes: Negative.   Respiratory: Negative.    Cardiovascular: Negative.  Negative for chest pain.  Gastrointestinal:  Positive for abdominal pain.  Endocrine: Negative.   Genitourinary:  Positive for difficulty urinating (chronic), dysuria, frequency, pelvic pain and urgency. Negative for decreased urine volume, flank pain, hematuria, vaginal bleeding, vaginal discharge and vaginal pain.  Musculoskeletal:  Positive for back pain (chronic  - she cant tell if there is worse back or flank pain that baseline).  Skin: Negative.   Allergic/Immunologic: Negative.   Neurological: Negative.  Negative for dizziness, tremors, weakness, light-headedness and numbness.  Hematological: Negative.   Psychiatric/Behavioral: Negative.  Negative for confusion.   All other systems reviewed and are negative.      Objective:   Vitals:   12/26/22 1439  BP: 116/70  Pulse: 82  Resp: 16  SpO2: 97%  Weight: 149 lb (67.6 kg)  Height: 5\' 3"  (1.6 m)    Body mass index is 26.39 kg/m.  Physical Exam Vitals and nursing note reviewed.  Constitutional:      General: She is not in acute distress.    Appearance: Normal appearance. She is well-developed. She is not ill-appearing, toxic-appearing or diaphoretic.  HENT:     Head: Normocephalic and atraumatic.     Nose: Nose normal.  Eyes:     General:        Right eye: No discharge.        Left eye: No discharge.     Conjunctiva/sclera: Conjunctivae normal.  Neck:     Trachea: No tracheal deviation.  Cardiovascular:     Rate and Rhythm: Normal rate and regular rhythm.  Pulmonary:     Effort: Pulmonary effort is normal. No respiratory distress.     Breath sounds: No stridor.  Abdominal:     General: Bowel sounds are normal.     Palpations: Abdomen is soft.     Tenderness: There is abdominal tenderness. There is right CVA tenderness. There is no left CVA tenderness.  Musculoskeletal:        General: Normal range of motion.  Skin:    General: Skin is warm and dry.     Findings: No rash.  Neurological:     Mental Status: She is alert.     Motor: No abnormal muscle tone.     Coordination: Coordination normal.  Psychiatric:        Behavior: Behavior normal.      Results for orders placed or performed in visit on 12/26/22  POCT Urinalysis Dipstick  Result Value Ref Range   Color, UA Yellow    Clarity, UA Clear  Glucose, UA Negative Negative   Bilirubin, UA Negative    Ketones,  UA Negative    Spec Grav, UA 1.020 1.010 - 1.025   Blood, UA Trace    pH, UA 6.0 5.0 - 8.0   Protein, UA Negative Negative   Urobilinogen, UA 0.2 0.2 or 1.0 E.U./dL   Nitrite, UA Negative    Leukocytes, UA Trace (A) Negative   Appearance     Odor         Assessment & Plan:   1. Urinary tract infection with hematuria, site unspecified UA dip trace blood and leukocytes Pt with sx very consistent with past UTI's, hx of recurrent UTI's, complicated UTIs in the past, bladder issues and pelvic floor dysfunction - very prone to UTIs She would like to start abx to help sx - if she gets any worse she was thinking about going to UC or ER She is feeling as if she starting to get generally ill with nausea and lack of appetite but she is not vomiting and she has not had a fever She would like to try oral outpatient antibiotic treatment along with Zofran We discussed the option for her to come back into the office if she is having trouble with the medications we could give her a Rocephin shot however if she is having severe pain or nausea vomiting she does need to go to the ER She did have right CVA tenderness on exam but otherwise was fairly well-appearing with normal vital signs She would like to use Pyridium but she was told once in the past before procedure not to take it we reviewed the mechanism of the drug and drug interactions and it appears to be an option for her so she will start Pyridium today along with her Keflex and Zofran as needed for nausea   - POCT Urinalysis Dipstick - CULTURE, URINE COMPREHENSIVE - ondansetron (ZOFRAN-ODT) 4 MG disintegrating tablet; Take 1 tablet (4 mg total) by mouth every 8 (eight) hours as needed for nausea or vomiting.  Dispense: 12 tablet; Refill: 0 - cephALEXin (KEFLEX) 500 MG capsule; Take 1 capsule (500 mg total) by mouth 2 (two) times daily for 7 days.  Dispense: 14 capsule; Refill: 0 - phenazopyridine (PYRIDIUM) 100 MG tablet; Take 1-2 tablets (100-200  mg total) by mouth 3 (three) times daily as needed for pain.  Dispense: 10 tablet; Refill: 0  F/up if not starting to improve in 2-3 days     Danelle Berry, PA-C 12/26/22 3:13 PM

## 2022-12-26 NOTE — Telephone Encounter (Signed)
Chief Complaint: Burning with Urination Symptoms: burning with urination, increased urgency, very little output, unable to urinated for a few hours now  Frequency: Constant onset today Pertinent Negatives: Patient denies foul odor, discharge, itching Disposition: [] ED /[] Urgent Care (no appt availability in office) / [x] Appointment(In office/virtual)/ []  [facility] [facility]/ [] Home Care/ [] Refused Recommended Disposition /[] [place] [facility]/ []  Follow-up with PCP Additional Notes: Patient states she woke up this morning and urinated, she had burning and discomfort at the time. Patient reports she has not used the bathroom since this morning but the burning and discomfort has continued. Patient also reports that she feels like she has to go to the bathroom but noting is coming out. Patient also stated she maybe dehydrated because she did not consume a lot of fluids yesterday or today. Care advice was given and patient has been scheduled to be evaluated at [facility] this afternoon. No availability today at [facility].  Summary: unable to urinate/burning   Pt called in states unable to urinate and has burning even when not going to the bathroom     Reason for Disposition  Urinating more frequently than usual (i.e., frequency)  Answer Assessment - Initial Assessment Questions 1. SYMPTOM: "What's the main symptom you're concerned about?" (e.g., frequency, incontinence)     Burning 2. ONSET: "When did the  burning  start?"     Today 3. PAIN: "Is there any pain?" If Yes, ask: "How bad is it?" (Scale: 1-10; mild, moderate, severe)     8/10 4. CAUSE: "What do you think is causing the symptoms?"     UTI 5. OTHER SYMPTOMS: "Do you have any other symptoms?" (e.g., blood in urine, fever, flank pain, pain with urination)     Burning, pain, increased urgency, very little output  Protocols used: Urinary Symptoms-A-AH

## 2022-12-28 LAB — CULTURE, URINE COMPREHENSIVE
MICRO NUMBER:: 15598303
SPECIMEN QUALITY:: ADEQUATE

## 2022-12-29 ENCOUNTER — Ambulatory Visit: Payer: Self-pay

## 2022-12-29 ENCOUNTER — Encounter: Payer: Self-pay | Admitting: Physician Assistant

## 2022-12-29 ENCOUNTER — Ambulatory Visit (INDEPENDENT_AMBULATORY_CARE_PROVIDER_SITE_OTHER): Payer: Medicare Other | Admitting: Physician Assistant

## 2022-12-29 ENCOUNTER — Ambulatory Visit
Admission: RE | Admit: 2022-12-29 | Discharge: 2022-12-29 | Disposition: A | Discharge status: Home / Self Care | Payer: Medicare Other | Source: Ambulatory Visit | Attending: Physician Assistant

## 2022-12-29 VITALS — BP 118/72 | HR 85 | Temp 98.0°F | Resp 16 | Ht 63.0 in | Wt 147.6 lb

## 2022-12-29 DIAGNOSIS — R319 Hematuria, unspecified: Secondary | ICD-10-CM | POA: Insufficient documentation

## 2022-12-29 DIAGNOSIS — R109 Unspecified abdominal pain: Secondary | ICD-10-CM

## 2022-12-29 DIAGNOSIS — N390 Urinary tract infection, site not specified: Secondary | ICD-10-CM | POA: Diagnosis not present

## 2022-12-29 DIAGNOSIS — Q630 Accessory kidney: Secondary | ICD-10-CM | POA: Diagnosis not present

## 2022-12-29 DIAGNOSIS — K5641 Fecal impaction: Secondary | ICD-10-CM | POA: Diagnosis not present

## 2022-12-29 MED ORDER — PHENAZOPYRIDINE HCL 100 MG PO TABS: 100.0000 mg | ORAL_TABLET | Freq: Three times a day (TID) | ORAL | 0 refills | Status: DC | PRN

## 2022-12-29 NOTE — Telephone Encounter (Signed)
     Chief Complaint: Right flank pain, chills, nausea. On antibiotic for UTI.  Symptoms: Above Frequency: Yesterday started feeling worse. Pertinent Negatives: Patient denies  Disposition: [] ED /[] Urgent Care (no appt availability in office) / [x] Appointment(In office/virtual)/ []  [place] [facility]/ [] Home Care/ [] Refused Recommended Disposition /[] [facility]/ []  Follow-up with PCP Additional Notes: Pt. Agrees with appointment.  Reason for Disposition  Side (flank) or lower back pain present  Answer Assessment - Initial Assessment Questions 1. SYMPTOM: "What's the main symptom you're concerned about?" (e.g., frequency, incontinence)     Flank pain, nausea, chills 2. ONSET: "When did the    start?"     Yesterday 3. PAIN: "Is there any pain?" If Yes, ask: "How bad is it?" (Scale: 1-10; mild, moderate, severe)     8 4. CAUSE: "What do you think is causing the symptoms?"     UTI 5. OTHER SYMPTOMS: "Do you have any other symptoms?" (e.g., blood in urine, fever, flank pain, pain with urination)     No 6. PREGNANCY: "Is there any chance you are pregnant?" "When was your last menstrual period?"     No  Protocols used: Urinary Symptoms-A-AH

## 2022-12-29 NOTE — Progress Notes (Signed)
Acute Office Visit   Patient: [name]   DOB: 1961/03/10   62 y.o. Female  MRN: [id number] Visit Date: 12/29/2022  Today's healthcare provider: Oswaldo Conroy Lometa Riggin, PA-C  Introduced myself to the patient as a Secondary school teacher and provided education on APPs in clinical practice.    Chief Complaint  Patient presents with   Flank Pain    Right side worst than previously seen    Subjective    HPI HPI     Flank Pain    Additional comments: Right side worst than previously seen       Last edited by Forde Radon, CMA on 12/29/2022  1:10 PM.       She reports  right flank pain that started Tuesday morning  She is currently being treated for UTI with Keflex for pan-sensitive e.coli infection   She reports she is still having burning with urination but she I able to urinate without hesitancy  She has been taking Keflex, Zofran and Pyridium as directed  She reports she has had trouble keeping tolerating PO intake due to discomfort  She reports suprapubic pain and chills She denies fevers at home   She states she  has a hx of kidney stones    Medications: Outpatient Medications Prior to Visit  Medication Sig   acetaminophen (TYLENOL) 325 MG tablet Take 2 tablets (650 mg total) by mouth every 6 (six) hours as needed for mild pain (or Fever >/= 101). (Patient taking differently: Take 1,000 mg by mouth every 6 (six) hours as needed for mild pain (pain score 1-3) (or Fever >/= 101).)   ALPRAZolam (XANAX) 1 MG tablet Take 1 mg by mouth at bedtime.    cephALEXin (KEFLEX) 500 MG capsule Take 1 capsule (500 mg total) by mouth 2 (two) times daily for 7 days.   dexlansoprazole (DEXILANT) 60 MG capsule TAKE 1 CAPSULE BY MOUTH EVERY DAY   diazepam (VALIUM) 5 MG tablet PLACE 1 TABLET VAGINALLY NIGHTLY AS NEEDED FOR MUSCLE SPASM/ PELVIC PAIN.   diltiazem (CARDIZEM CD) 240 MG 24 hr capsule Take 240 mg by mouth daily.   ELIQUIS 5 MG TABS tablet TAKE 1 TABLET BY MOUTH TWICE A DAY    linaclotide (LINZESS) 290 MCG CAPS capsule TAKE 1 CAPSULE BY MOUTH EVERY DAY BEFORE BREAKFAST   mirtazapine (REMERON) 7.5 MG tablet Take 7.5 mg by mouth at bedtime.   Mometasone Furoate (ALLERGY NASAL SPRAY NA) Place 1 spray into the nose daily as needed (allergies/ decongestion).   montelukast (SINGULAIR) 10 MG tablet TAKE 1 TABLET BY MOUTH EVERY DAY   morphine (MSIR) 15 MG tablet Take 1 tablet (15 mg total) by mouth every 6 (six) hours as needed for moderate pain or severe pain. Must last 30 days.   [START ON 01/17/2023] morphine (MSIR) 15 MG tablet Take 1 tablet (15 mg total) by mouth every 6 (six) hours as needed for moderate pain or severe pain. Must last 30 days.   [START ON 02/16/2023] morphine (MSIR) 15 MG tablet Take 1 tablet (15 mg total) by mouth every 6 (six) hours as needed for moderate pain or severe pain. Must last 30 days.   naloxone (NARCAN) nasal spray 4 mg/0.1 mL Place 1 spray into the nose as needed for up to 365 doses (for opioid-induced respiratory depresssion). In case of emergency (overdose), spray once into each nostril. If no response within 3 minutes, repeat application and call 911.   ondansetron (ZOFRAN-ODT)  4 MG disintegrating tablet Take 1 tablet (4 mg total) by mouth every 8 (eight) hours as needed for nausea or vomiting.   phenazopyridine (PYRIDIUM) 100 MG tablet Take 1-2 tablets (100-200 mg total) by mouth 3 (three) times daily as needed for pain.   valACYclovir (VALTREX) 1000 MG tablet TAKE TWO TABLETS BY MOUTH TWICE DAILY FOR ONE DAY FEVER BLISTER   colchicine 0.6 MG tablet Take 1 tablet (0.6 mg total) by mouth 2 (two) times daily for 5 days.   No facility-administered medications prior to visit.    Review of Systems  Constitutional:  Positive for chills. Negative for fatigue and fever.  Genitourinary:  Positive for dysuria and flank pain. Negative for difficulty urinating and frequency.  Neurological:  Positive for headaches.        Objective    BP 118/72    Pulse 85   Temp 98 F (36.7 C) (Oral)   Resp 16   Ht 5\' 3"  (1.6 m)   Wt 147 lb 9.6 oz (67 kg)   SpO2 94%   BMI 26.15 kg/m     Physical Exam Vitals reviewed.  Constitutional:      General: She is awake.     Appearance: Normal appearance. She is well-developed and well-groomed.  HENT:     Head: Normocephalic and atraumatic.  Pulmonary:     Effort: Pulmonary effort is normal.  Abdominal:     General: Abdomen is flat. Bowel sounds are normal.     Palpations: Abdomen is soft.     Tenderness: There is abdominal tenderness in the suprapubic area. There is right CVA tenderness.  Neurological:     Mental Status: She is alert.  Psychiatric:        Attention and Perception: Attention and perception normal.        Mood and Affect: Mood and affect normal.        Speech: Speech normal.        Behavior: Behavior normal. Behavior is cooperative.       No results found for any visits on 12/29/22.  Assessment & Plan      No follow-ups on file.      Problem List Items Addressed This Visit   None Visit Diagnoses     Acute right flank pain    -  Primary Acute, new concern She reports new right flank pain that is not improving despite taking Keflex as directed for pan-sensitive E.coli UTI Will get CT renal study for rule out Continue Keflex per urine culture results- abx options are rather limited due to allergies and Keflex should be effective Will refill Pyridium to manage discomfort Continue to stay hydrated - take Zofran as needed to assist with nausea Reviewed ED and return precautions Follow up as needed for persistent or progressing symptoms      Relevant Orders   CT RENAL STONE STUDY   Urinary tract infection with hematuria, site unspecified     Currently taking Keflex for E.coli UTI Continue with regimen as urine culture showed pansensitive organism Follow up as needed for persistent or progressing symptoms     Relevant Orders   CT RENAL STONE STUDY         No follow-ups on file.   I, Kyuss Hale E Doak Mah, PA-C, have reviewed all documentation for this visit. The documentation on 12/29/22 for the exam, diagnosis, procedures, and orders are all accurate and complete.   Jacquelin Hawking, MHS, PA-C [facility]

## 2023-01-01 ENCOUNTER — Encounter (HOSPITAL_COMMUNITY): Payer: Self-pay | Admitting: Internal Medicine

## 2023-01-01 ENCOUNTER — Ambulatory Visit (HOSPITAL_COMMUNITY)
Admission: RE | Admit: 2023-01-01 | Discharge: 2023-01-01 | Disposition: A | Discharge status: Home / Self Care | Payer: Medicare Other | Source: Ambulatory Visit | Attending: Internal Medicine | Admitting: Internal Medicine

## 2023-01-01 VITALS — BP 114/84 | HR 80 | Ht 63.0 in | Wt 148.4 lb

## 2023-01-01 DIAGNOSIS — I4811 Longstanding persistent atrial fibrillation: Secondary | ICD-10-CM | POA: Diagnosis not present

## 2023-01-01 DIAGNOSIS — I483 Typical atrial flutter: Secondary | ICD-10-CM | POA: Insufficient documentation

## 2023-01-01 DIAGNOSIS — I10 Essential (primary) hypertension: Secondary | ICD-10-CM | POA: Diagnosis not present

## 2023-01-01 DIAGNOSIS — G4733 Obstructive sleep apnea (adult) (pediatric): Secondary | ICD-10-CM | POA: Insufficient documentation

## 2023-01-01 DIAGNOSIS — I4819 Other persistent atrial fibrillation: Secondary | ICD-10-CM | POA: Insufficient documentation

## 2023-01-01 DIAGNOSIS — Z9049 Acquired absence of other specified parts of digestive tract: Secondary | ICD-10-CM | POA: Diagnosis not present

## 2023-01-01 NOTE — Progress Notes (Signed)
Your CT stone study did not show evidence of a kidney stone or blockage in the urinary system. Please let us know if you are still having concerns or further questions

## 2023-01-01 NOTE — Progress Notes (Signed)
Primary Care Physician: Erasmo Downer, MD Primary Cardiologist: Dr Azucena Cecil Primary Electrophysiologist: Dr Graciela Husbands Referring Physician: Dr Oliva Bustard is a 62 y.o. female with a history of OSA, HTN, atrial flutter, atrial fibrillation who presents for follow up in the [facility]. Patient is on Eliquis for a CHADS2VASC score of 5. She had been maintained on amiodarone but underwent afib and flutter ablation on 08/06/20 in order to avoid the off target effects of amio.   On follow up today, patient reports she did have some post op CP which resolved with ibuprofen (phone note 08/18/20). She was inadvertently taking amlodipine instead of continuing amiodarone. She is now back on amiodarone. She has had a few episodes of heart racing since the ablation while off amiodarone.   On evaluation today, she is currently in NSR. S/p Afib and flutter ablation on 12/04/22 by Dr. Lalla Brothers. She notes to have brief paroxysmal episodes that last 10-15 minutes of Afib but overall NSR > Afib. She still feels a little tired and SOB when walking. No chest pain or trouble swallowing. Leg sites healed without issue. No missed doses of anticoagulant.  Today, she denies symptoms of orthopnea, PND, lower extremity edema, dizziness, presyncope, syncope, snoring, daytime somnolence, bleeding, or neurologic sequela. The patient is tolerating medications without difficulties and is otherwise without complaint today.    Atrial Fibrillation Risk Factors:  she does have symptoms or diagnosis of sleep apnea. she is compliant with CPAP therapy. she does not have a history of rheumatic fever.   she has a BMI of Body mass index is 26.29 kg/m.Marland Kitchen Filed Weights   01/01/23 1136  Weight: 67.3 kg     Family History  Problem Relation Age of Onset   Aneurysm Mother    Bipolar disorder Sister    Aneurysm Maternal Grandmother    Breast cancer Paternal Grandmother 83   Bipolar  disorder Grandchild    Anxiety disorder Grandchild    Depression Grandchild    Cancer Neg Hx     Atrial Fibrillation Management history:  Previous antiarrhythmic drugs: amiodarone Previous cardioversions: none Previous ablations: 08/06/20, 12/04/22 CHADS2VASC score: 5 Anticoagulation history: Eliquis   Past Medical History:  Diagnosis Date   Abnormal EKG    HX OF INVERTED T WAVES ON EKG, PALPITATIONS, CHEST PAINS-CARDIAC WORK UP DID NOT SHOW ANY HEART DISEASE   AC (acromioclavicular) joint bone spurs    Acute postoperative pain 01/04/2017   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    Chronic headaches    Chronic, continuous use of opioids    DDD (degenerative disc disease)    CERVICAL AND LUMBAR-CHRONIC PAIN, RT HIP LABRAL TEAR   Depression    PT STATES A LOT OF STRESS IN HER LIFE   Dissociative disorder    Dizziness 04/22/2013   Duodenal ulcer with hemorrhage and perforation (HCC) 04/27/2003   Foot drop, right    FROM BACK SURGERY   GERD (gastroesophageal reflux disease)    H/O arthrodesis 08/18/2014   Headache(784.0)    AND NECK PAIN--STATES RECENT TEST SHOW CERVICAL DEGENERATION   History of blood transfusion    s/p back surgery   History of cardiac cath    a. 05/2019 Cath: Nl cors. EF 55-65%.   History of cardioversion  History of cervical spinal surgery 01/04/2015   History of kidney stones    Hypertension    Inverted T wave    Iron deficiency 02/01/2021   Mitral regurgitation    a. 07/2020 Echo: EF 60-65%, no rwma. Nl RV size/fxn. RVSP 39.12mmHg. Mildly to mod dil LA. Mod MR; b. 07/2020 TEE: EF 55-60%. Lambl's excresence. Nl RV size/fxn. Midly dil LA. No LA/LAA thrombus. Mild MR.   Narrowing of intervertebral disc space 08/18/2014   Currently on disability.    Orthostatic hypotension 04/22/2013   Pain    CHRONIC NECK AND BACK PAIN -  LIMITED ROM NECK - S/P FUSIONS CERVICAL AND LUMBAR   Paroxysmal atrial fibrillation (HCC) 2021   a. 07/2020 s/p PVI.   Pneumonia    PONV (postoperative nausea and vomiting)    PT GIVES HX OF N&V AND FEVER WITH SURGERIES YEARS AGO--BUT NO PROBLEMS WITH MORE RECENT SURGERIES--STATES NOT MALIGNANT HYPERTHERMIA   Postop Hyponatremia 05/14/2012   Postoperative anemia due to acute blood loss 05/14/2012   PTSD (post-traumatic stress disorder)    Right foot drop    Right hip arthralgia 08/18/2014   Status post surgery of right, and now needs left.    Sleep apnea 2021   does not have a cpap   Therapeutic opioid-induced constipation (OIC)    Typical atrial flutter [facility])    Past Surgical History:  Procedure Laterality Date   ABDOMINAL HYSTERECTOMY     afib  05/2019   ANTERIOR CERVICAL DECOMP/DISCECTOMY FUSION N/A 10/30/2012   Procedure: ACDF C5-6, EXPLORATION AND HARDWARE REMOVAL C6-7;  Surgeon: Venita Lick, MD;  Location: [facility] OR;  Service: Orthopedics;  Laterality: N/A;   ANTERIOR FUSION CERVICAL SPINE  MAY 2012   AT [facility]   ATRIAL FIBRILLATION ABLATION N/A 08/06/2020   Procedure: ATRIAL FIBRILLATION ABLATION;  Surgeon: Lanier Prude, MD;  Location: [facility] INVASIVE CV LAB;  Service: Cardiovascular;  Laterality: N/A;   ATRIAL FIBRILLATION ABLATION N/A 12/04/2022   Procedure: ATRIAL FIBRILLATION ABLATION;  Surgeon: Lanier Prude, MD;  Location: [facility] INVASIVE CV LAB;  Service: Cardiovascular;  Laterality: N/A;   BACK SURGERY  2009   LUMBAR FUSION WITH RODS    BREAST BIOPSY Right 2008   benign.- bx/clip   BREAST EXCISIONAL BIOPSY Left 1998   benign   BREAST REDUCTION SURGERY Bilateral 06/2016   CARDIAC ELECTROPHYSIOLOGY MAPPING AND ABLATION     CARDIOVERSION N/A 12/03/2019   Procedure: CARDIOVERSION;  Surgeon: Debbe Odea, MD;  Location: [facility] ORS;  Service: Cardiovascular;  Laterality: N/A;   CARDIOVERSION N/A 08/01/2021   Procedure: CARDIOVERSION;  Surgeon: Debbe Odea, MD;   Location: [facility] ORS;  Service: Cardiovascular;  Laterality: N/A;   CARDIOVERSION N/A 08/05/2021   Procedure: CARDIOVERSION;  Surgeon: Antonieta Iba, MD;  Location: [facility] ORS;  Service: Cardiovascular;  Laterality: N/A;   CARPAL TUNNEL RELEASE  05-06-12   Right   CHOLECYSTECTOMY     COLONOSCOPY WITH PROPOFOL N/A 01/26/2017   Procedure: COLONOSCOPY WITH PROPOFOL;  Surgeon: Midge Minium, MD;  Location: [facility];  Service: Endoscopy;  Laterality: N/A;   COLONOSCOPY WITH PROPOFOL N/A 01/16/2021   Procedure: COLONOSCOPY WITH PROPOFOL;  Surgeon: Toney Reil, MD;  Location: [facility] ENDOSCOPY;  Service: Gastroenterology;  Laterality: N/A;   DIAGNOSTIC LAPAROSCOPIES - MULTIPLE FOR ENDOMETRIOSIS     ESOPHAGEAL DILATION N/A 01/26/2017   Procedure: ESOPHAGEAL DILATION;  Surgeon: Midge Minium, MD;  Location: [facility]ERY [facility];  Service: Endoscopy;  Laterality: N/A;   ESOPHAGEAL MANOMETRY N/A 05/30/2017  Procedure: ESOPHAGEAL MANOMETRY (EM);  Surgeon: Midge Minium, MD;  Location: [facility] ENDOSCOPY;  Service: Endoscopy;  Laterality: N/A;   ESOPHAGOGASTRODUODENOSCOPY (EGD) WITH PROPOFOL N/A 01/26/2017   Procedure: ESOPHAGOGASTRODUODENOSCOPY (EGD) WITH PROPOFOL;  Surgeon: Midge Minium, MD;  Location: [facility];  Service: Endoscopy;  Laterality: N/A;   ESOPHAGOGASTRODUODENOSCOPY (EGD) WITH PROPOFOL N/A 01/30/2020   Procedure: ESOPHAGOGASTRODUODENOSCOPY (EGD) WITH PROPOFOL;  Surgeon: Midge Minium, MD;  Location: [facility] SUR[facility]ERY [facility];  Service: Endoscopy;  Laterality: N/A;  sleep apnea COVID + 01-15-20   ESOPHAGOGASTRODUODENOSCOPY (EGD) WITH PROPOFOL N/A 01/15/2021   Procedure: ESOPHAGOGASTRODUODENOSCOPY (EGD) WITH PROPOFOL;  Surgeon: Toney Reil, MD;  Location: [facility] ENDOSCOPY;  Service: Gastroenterology;  Laterality: N/A;   ESOPHAGOGASTRODUODENOSCOPY (EGD) WITH PROPOFOL N/A 12/07/2021   Procedure: ESOPHAGOGASTRODUODENOSCOPY (EGD) WITH PROPOFOL;  Surgeon: Toney Reil, MD;   Location: [facility] ENDOSCOPY;  Service: Gastroenterology;  Laterality: N/A;   HIP ARTHROSCOPY  09/20/2011   Procedure: ARTHROSCOPY HIP;  Surgeon: Loanne Drilling, MD;  Location: [facility] ORS;  Service: Orthopedics;  Laterality: Right;  Right Hip Scope with Labral Debridement   LEFT HEART CATH AND CORONARY ANGIOGRAPHY Left 06/10/2019   Procedure: LEFT HEART CATH AND CORONARY ANGIOGRAPHY;  Surgeon: Yvonne Kendall, MD;  Location: [facility] INVASIVE CV LAB;  Service: Cardiovascular;  Laterality: Left;   LOWER EXTREMITY ANGIOGRAPHY Left 01/06/2022   Procedure: Lower Extremity Angiography;  Surgeon: Renford Dills, MD;  Location: [facility] INVASIVE CV LAB;  Service: Cardiovascular;  Laterality: Left;   NASAL SEPTUM SURGERY  MARCH 2013   IN [place]   [facility] IMPEDANCE STUDY N/A 05/30/2017   Procedure: PH IMPEDANCE STUDY;  Surgeon: Midge Minium, MD;  Location: [facility] ENDOSCOPY;  Service: Endoscopy;  Laterality: N/A;   POLYPECTOMY N/A 01/26/2017   Procedure: POLYPECTOMY;  Surgeon: Midge Minium, MD;  Location: [facility];  Service: Endoscopy;  Laterality: N/A;   POSTERIOR CERVICAL FUSION/FORAMINOTOMY N/A 04/16/2013   Procedure: REMOVAL CERVICAL PLATES AND INTERBODY CAGE/POSTERIOR CERVICAL SPINAL FUSION C4 - C6/C5 CORPECTOMY/C4 - C6 FUSION WITH ILIAC CREST BONE GRAFT;  Surgeon: Venita Lick, MD;  Location: [facility] OR;  Service: Orthopedics;  Laterality: N/A;   RADIOFREQUENCY ABLATION NERVES     REDUCTION MAMMAPLASTY Bilateral 05/2016   RIGHT HIP ARTHROSCOPY FOR LABRAL TEAR  ABOUT 2010   2012 also   SHOULDER ARTHROSCOPY  05-06-12   bone spur   TEE WITHOUT CARDIOVERSION N/A 08/05/2020   Procedure: TRANSESOPHAGEAL ECHOCARDIOGRAM (TEE);  Surgeon: Lewayne Bunting, MD;  Location: [facility] ENDOSCOPY;  Service: Cardiovascular;  Laterality: N/A;   TONSILLECTOMY     AS A CHILD   TOTAL HIP ARTHROPLASTY Right 05/13/2012   Procedure: TOTAL HIP ARTHROPLASTY ANTERIOR APPROACH;  Surgeon: Loanne Drilling, MD;  Location: [facility] ORS;  Service:  Orthopedics;  Laterality: Right;    Current Outpatient Medications  Medication Sig Dispense Refill   acetaminophen (TYLENOL) 325 MG tablet Take 2 tablets (650 mg total) by mouth every 6 (six) hours as needed for mild pain (or Fever >/= 101). (Patient taking differently: Take 1,000 mg by mouth every 6 (six) hours as needed for mild pain (pain score 1-3) (or Fever >/= 101).)     ALPRAZolam (XANAX) 1 MG tablet Take 1 mg by mouth at bedtime.      cephALEXin (KEFLEX) 500 MG capsule Take 1 capsule (500 mg total) by mouth 2 (two) times daily for 7 days. 14 capsule 0   dexlansoprazole (DEXILANT) 60 MG capsule TAKE 1 CAPSULE BY MOUTH EVERY DAY 90 capsule 1   diazepam (VALIUM) 5  MG tablet PLACE 1 TABLET VAGINALLY NIGHTLY AS NEEDED FOR MUSCLE SPASM/ PELVIC PAIN. 10 tablet 0   diltiazem (CARDIZEM CD) 240 MG 24 hr capsule Take 240 mg by mouth daily.     ELIQUIS 5 MG TABS tablet TAKE 1 TABLET BY MOUTH TWICE A DAY 60 tablet 5   linaclotide (LINZESS) 290 MCG CAPS capsule TAKE 1 CAPSULE BY MOUTH EVERY DAY BEFORE BREAKFAST 30 capsule 1   Mometasone Furoate (ALLERGY NASAL SPRAY NA) Place 1 spray into the nose daily as needed (allergies/ decongestion).     montelukast (SINGULAIR) 10 MG tablet TAKE 1 TABLET BY MOUTH EVERY DAY 90 tablet 1   morphine (MSIR) 15 MG tablet Take 1 tablet (15 mg total) by mouth every 6 (six) hours as needed for moderate pain or severe pain. Must last 30 days. 120 tablet 0   [START ON 01/17/2023] morphine (MSIR) 15 MG tablet Take 1 tablet (15 mg total) by mouth every 6 (six) hours as needed for moderate pain or severe pain. Must last 30 days. 120 tablet 0   [START ON 02/16/2023] morphine (MSIR) 15 MG tablet Take 1 tablet (15 mg total) by mouth every 6 (six) hours as needed for moderate pain or severe pain. Must last 30 days. 120 tablet 0   naloxone (NARCAN) nasal spray 4 mg/0.1 mL Place 1 spray into the nose as needed for up to 365 doses (for opioid-induced respiratory depresssion). In case of  emergency (overdose), spray once into each nostril. If no response within 3 minutes, repeat application and call 911. 1 each 0   ondansetron (ZOFRAN-ODT) 4 MG disintegrating tablet Take 1 tablet (4 mg total) by mouth every 8 (eight) hours as needed for nausea or vomiting. 12 tablet 0   phenazopyridine (PYRIDIUM) 100 MG tablet Take 1-2 tablets (100-200 mg total) by mouth 3 (three) times daily as needed for pain. 20 tablet 0   valACYclovir (VALTREX) 1000 MG tablet TAKE TWO TABLETS BY MOUTH TWICE DAILY FOR ONE DAY FEVER BLISTER 4 tablet 5   No current facility-administered medications for this encounter.    Allergies  Allergen Reactions   Amoxicillin Hives    She did ok w ANCEF   Chlorhexidine Gluconate Dermatitis and Hives   Clindamycin Hives   Codeine Hives   Erythromycin Hives    "mycins" in general   Penicillin G Hives    "cillins" in general   Sulfa Antibiotics Nausea And Vomiting and Hives        Levofloxacin Hives   Shellfish Allergy Hives   Decadron [Dexamethasone] Other (See Comments)    Hot flashes, insomnia, "manic" Hot flashes, insomnia, "manic"   Flecainide Swelling    Pts said she signs of going into heart failure. Adema   Mangifera Indica Hives    papaya   Papaya Derivatives Hives   Betadine [Povidone Iodine] Hives        Clarithromycin Hives   Other Hives     Mango    Povidone-Iodine Hives   Prednisone Anxiety    High blood pressure, flushed, mood changes, heart palpitations High blood pressure, flushed, mood changes, heart palpitations   ROS- All systems are reviewed and negative except as per the HPI above.  Physical Exam: Vitals:   01/01/23 1136  BP: 114/84  Pulse: 80  Weight: 67.3 kg  Height: 5\' 3"  (1.6 m)    GEN- The patient is well appearing, alert and oriented x 3 today.   Neck - no JVD or carotid bruit noted Lungs-  Clear to ausculation bilaterally, normal work of breathing Heart- Regular rate and rhythm, no murmurs, rubs or gallops, PMI not  laterally displaced Extremities- no clubbing, cyanosis, or edema Skin - no rash or ecchymosis noted   Wt Readings from Last 3 Encounters:  01/01/23 67.3 kg  12/29/22 67 kg  12/26/22 67.6 kg    EKG today demonstrates  Vent. rate 80 BPM PR interval 136 ms QRS duration 82 ms QT/QTcB 388/447 ms P-R-T axes 16 24 8  Normal sinus rhythm Minimal voltage criteria for LVH, may be normal variant ( R in aVL ) ST & T wave abnormality, consider anterolateral ischemia Abnormal ECG When compared with ECG of 04-Dec-2022 10:44, PREVIOUS ECG IS PRESENT  Echo 07/31/21: 1. Left ventricular ejection fraction, by estimation, is 55 to 60%. Left  ventricular ejection fraction by 2D MOD biplane is 55.3 %. The left  ventricle has normal function. The left ventricle has no regional wall  motion abnormalities. There is mild left  ventricular hypertrophy. Left ventricular diastolic parameters are  indeterminate.   2. Right ventricular systolic function is normal. The right ventricular  size is normal.   3. The mitral valve is normal in structure. Trivial mitral valve  regurgitation.   4. The aortic valve is tricuspid. Aortic valve regurgitation is trivial.    Epic records are reviewed at length today  CHA2DS2-VASc Score = 3  The patient's score is based upon: CHF History: 0 HTN History: 1 Diabetes History: 0 Stroke History: 0 Vascular Disease History: 1 Age Score: 0 Gender Score: 1     ASSESSMENT AND PLAN: 1. Persistent Atrial Fibrillation/atrial flutter (typical and atypical) The patient's CHA2DS2-VASc score is 3, indicating a 3.2% annual risk of stroke.   S/p afib and flutter ablation 08/06/20 S/p Afib and flutter ablation on 12/04/22 by Dr. Lalla Brothers.   She is currently in NSR. Advised patient to contact clinic prior to 3 month appt if she develops increasing burden of Afib > NSR. Dr. Lalla Brothers does mention in previous note to consider Tikosyn for long term medication therapy if increased  burden going forward.   Continue diltiazem 240 mg daily.  2. HTN Stable, no changes today.  3. Obstructive sleep apnea The importance of adequate treatment of sleep apnea was discussed today in order to improve our ability to maintain sinus rhythm long term. Encouraged compliance with CPAP therapy.    Follow up as scheduled with Dr. Lalla Brothers.    Justin Mend, PA-C Afib Clinic [facility] [address] [place], [place] [address] [phone] 01/01/2023 1:35 PM

## 2023-01-03 ENCOUNTER — Ambulatory Visit: Payer: Self-pay

## 2023-01-03 ENCOUNTER — Encounter: Payer: Self-pay | Admitting: Family Medicine

## 2023-01-03 ENCOUNTER — Ambulatory Visit: Payer: Medicare Other | Admitting: Gastroenterology

## 2023-01-03 ENCOUNTER — Encounter: Payer: Self-pay | Admitting: Gastroenterology

## 2023-01-03 VITALS — BP 139/82 | HR 68 | Temp 98.0°F | Ht 63.0 in | Wt 148.2 lb

## 2023-01-03 DIAGNOSIS — K5903 Drug induced constipation: Secondary | ICD-10-CM | POA: Diagnosis not present

## 2023-01-03 DIAGNOSIS — K219 Gastro-esophageal reflux disease without esophagitis: Secondary | ICD-10-CM | POA: Diagnosis not present

## 2023-01-03 DIAGNOSIS — T402X5A Adverse effect of other opioids, initial encounter: Secondary | ICD-10-CM

## 2023-01-03 DIAGNOSIS — K8681 Exocrine pancreatic insufficiency: Secondary | ICD-10-CM | POA: Diagnosis not present

## 2023-01-03 DIAGNOSIS — B3731 Acute candidiasis of vulva and vagina: Secondary | ICD-10-CM

## 2023-01-03 MED ORDER — FLUCONAZOLE 150 MG PO TABS
150.0000 mg | ORAL_TABLET | Freq: Once | ORAL | 0 refills | Status: AC
Start: 2023-01-03 — End: 2023-01-03

## 2023-01-03 MED ORDER — NALOXEGOL OXALATE 25 MG PO TABS: 25.0000 mg | ORAL_TABLET | Freq: Every day | ORAL | 2 refills | Status: DC

## 2023-01-03 MED ORDER — GOLYTELY 236 G PO SOLR
4000.0000 mL | Freq: Once | ORAL | 0 refills | Status: AC
Start: 2023-01-03 — End: 2023-01-03

## 2023-01-03 MED ORDER — PANTOPRAZOLE SODIUM 40 MG PO TBEC
40.0000 mg | DELAYED_RELEASE_TABLET | Freq: Every day | ORAL | 3 refills | Status: DC
Start: 2023-01-03 — End: 2023-06-04

## 2023-01-03 NOTE — Progress Notes (Unsigned)
Arlyss Repress, MD [address]  [address]  [place], [place] [address]  Main: [phone]  Fax: [phone]    Gastroenterology Consultation  Referring Provider:     Erasmo Downer, MD Primary Care Physician:  Erasmo Downer, MD Primary Gastroenterologist:  Dr. Arlyss Repress Reason for Consultation: chronic constipation and abdominal bloating        HPI:   [name] is a 62 y.o. female referred by Dr. Beryle Flock, Marzella Schlein, MD  for consultation & management of exocrine pancreatic insufficiency. Patient has history of paroxysmal A. fib/flutter s/p ablation on 08/06/2020, sinus bradycardia, hypertension, asthma, GERD s/p robotic assisted laparoscopic paraesophageal hernia repair with Nissen's fundoplication in 02/2020 by Dr. Everlene Farrier, history of OSA.  Patient was admitted to [facility] in early November 2022 secondary to severe abdominal discomfort, nausea.  She underwent CT angio chest abdomen and pelvis with no evidence of acute intra-abdominal pathology.  There is no evidence of significant visceral artery stenosis.  There was presence of nonspecific enteritis and small bowel ileus with no evidence of bowel obstruction.  There was also presence of pancreatic atrophy, she is status postcholecystectomy.  Patient was managed conservatively which resolved her ileus and she started moving her bowels.  Subsequently, patient underwent upper endoscopy and colonoscopy including biopsies which were all unremarkable.  There was no evidence of IBD or any other pathology.  Patient is found to have low pancreatic fecal elastase levels 107, therefore started on Creon.  Since discharge, patient reports that she has been taking Creon up to 3 pills daily because she does not have 3 full meals a day.  She gained about 4 pounds since discharge.  She does report ongoing abdominal discomfort, abdominal bloating and loose stools sometimes mushy and foul-smelling and the symptoms are predominantly  postprandial.  She is also found to have iron deficiency anemia, received parenteral iron therapy as outpatient.  She has follow-up labs in 04/2021 by Dr. Donneta Romberg. She was not a smoker in her life.  She does not drink alcohol  Follow-up visit 07/14/2021 Patient reports ongoing upper abdominal discomfort associated with abdominal bloating and intermittent constipation.  She started taking MiraLAX 3 capfuls daily to move her bowels which lead to 3-4 loose bowel movements a day.  She tried higher dose of Linzess which did not help. She admits to drinking less water and consumes ice tea daily.  She likes to eat sweet potato and white potatoes on a regular basis.  She does not consume any fruit juices, red meat.  She is taking Creon with each meal.  She is gaining weight.  She wanted to see dietitian at [facility], however, her insurance does not cover the visit and she has to pay $300 consultation fee.  She decided not to pursue  Follow-up visit 01/17/2022 Patient is here for follow-up of abdominal bloating,  worsening of acid reflux.  She recently underwent repair of pseudoaneurysm of the left groin, which resulted in decreased mobility.  She has gained more weight, has been consuming more carbs and sweets.  She reports worsening of acid reflux, has been taking omeprazole 40 mg p.o. twice daily.  She underwent upper endoscopy in September for difficulty swallowing, the wrap appeared intact.  There was no evidence of stricture.  Esophageal biopsies were unremarkable.  She also reports abdominal bloating, continues to take Creon 72 K with each meal and 36 K with snack.   Follow-up visit 01/03/2023 [name] is here for follow-up  of opioid-induced constipation.  She states that Linzess 290 mcg is not helping.  Lactulose and MiraLAX did not work in the past.  She reports having bowel movement about once a week with incomplete emptying, feels bloated all the time.  She has stopped taking pancreatic enzymes.  She is  no longer taking Dexilant.  Switched to Protonix 40 mg daily by her cardiologist.  NSAIDs: None  Antiplts/Anticoagulants/Anti thrombotics: Eliquis for history of A. fib  GI Procedures:  EGD 12/07/2021 - Normal duodenal bulb and second portion of the duodenum. - Normal stomach. - A Nissen fundoplication was found. The wrap appears intact. - Normal gastroesophageal junction and esophagus. Biopsied. DIAGNOSIS:  A. ESOPHAGUS, RANDOM; COLD BIOPSY:  - SQUAMOUS MUCOSA WITH CHANGES COMPATIBLE WITH REFLUX.  - NEGATIVE FOR INTRAEPITHELIAL EOSINOPHILS, DYSPLASIA, AND MALIGNANCY.    Upper endoscopy 01/15/21 - Suspected jejunal inflammation characterized by friability. Biopsied. - Normal examined duodenum. Biopsied. - Mucosal changes suspicious for gastritis. Biopsied. - A Nissen fundoplication was found. The wrap appears intact. - Esophagogastric landmarks identified. - Normal gastroesophageal junction and esophagus. DIAGNOSIS:  A. JEJUNUM; COLD BIOPSY:  - ENTERIC MUCOSA WITH DENSE LYMPHOPLASMACYTIC INFILTRATE.  - NEGATIVE FOR ACTIVE INFLAMMATION.  - NEGATIVE FOR DYSPLASIA NAD MALIGNANCY.   B. DUODENUM; COLD BIOPSY:  - DUODENAL MUCOSA WITH NO SIGNIFICANT PATHOLOGIC ALTERATION.  - NEGATIVE FOR FEATURES OF CELIAC DISEASE.  - NEGATIVE FOR DYSPLASIA AND MALIGNANCY.   C. STOMACH, RANDOM; COLD BIOPSY:  - CHRONIC ACTIVE GASTRITIS WITH REACTIVE CHANGES.  - NEGATIVE FOR HELICOBACTER PYLORI BY IMMUNOHISTOCHEMISTRY.  - NEGATIVE FOR INTESTINAL METAPLASIA, DYSPLASIA, AND MALIGNANCY.   Colonoscopy 01/16/2021 - Preparation of the colon was fair. - The examined portion of the ileum was normal. - The entire examined colon is normal. Biopsied. - The distal rectum and anal verge are normal on retroflexion view. DIAGNOSIS:  A. COLON, RANDOM; COLD BIOPSY:  - COLONIC MUCOSA WITH NO SIGNIFICANT PATHOLOGIC ALTERATION.  - NEGATIVE FOR ACTIVE INFLAMMATION AND FEATURES OF CHRONICITY.  - NEGATIVE FOR  DYSPLASIA AND MALIGNANCY.   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    Chronic headaches    Chronic, continuous use of opioids    DDD (degenerative disc disease)    CERVICAL AND LUMBAR-CHRONIC PAIN, RT HIP LABRAL TEAR   Depression    PT STATES A LOT OF STRESS IN HER LIFE   Dissociative disorder    Dizziness 04/22/2013   Duodenal ulcer with hemorrhage and perforation (HCC) 04/27/2003   Foot drop, right    FROM BACK SURGERY   GERD (gastroesophageal reflux disease)    H/O arthrodesis 08/18/2014   Headache(784.0)    AND NECK PAIN--STATES RECENT TEST SHOW CERVICAL DEGENERATION   History of blood transfusion    s/p back surgery   History of cardiac cath    a. 05/2019 Cath: Nl cors. EF 55-65%.   History of cardioversion    History of cervical spinal surgery 01/04/2015   History of kidney stones    Hypertension    Inverted T wave    Iron deficiency 02/01/2021  Mitral regurgitation    a. 07/2020 Echo: EF 60-65%, no rwma. Nl RV size/fxn. RVSP 39.49mmHg. Mildly to mod dil LA. Mod MR; b. 07/2020 TEE: EF 55-60%. Lambl's excresence. Nl RV size/fxn. Midly dil LA. No LA/LAA thrombus. Mild MR.   Narrowing of intervertebral disc space 08/18/2014   Currently on disability.    Orthostatic hypotension 04/22/2013   Pain    CHRONIC NECK AND BACK PAIN - LIMITED ROM NECK - S/P FUSIONS CERVICAL AND LUMBAR   Paroxysmal atrial fibrillation (HCC) 2021   a. 07/2020 s/p PVI.   Pneumonia    PONV (postoperative nausea and vomiting)    PT GIVES HX OF N&V AND FEVER WITH SURGERIES YEARS AGO--BUT NO PROBLEMS WITH MORE RECENT SURGERIES--STATES  NOT MALIGNANT HYPERTHERMIA   Postop Hyponatremia 05/14/2012   Postoperative anemia due to acute blood loss 05/14/2012   PTSD (post-traumatic stress disorder)    Right foot drop    Right hip arthralgia 08/18/2014   Status post surgery of right, and now needs left.    Sleep apnea 2021   does not have a cpap   Therapeutic opioid-induced constipation (OIC)    Typical atrial flutter [facility])     Past Surgical History:  Procedure Laterality Date   ABDOMINAL HYSTERECTOMY     afib  05/2019   ANTERIOR CERVICAL DECOMP/DISCECTOMY FUSION N/A 10/30/2012   Procedure: ACDF C5-6, EXPLORATION AND HARDWARE REMOVAL C6-7;  Surgeon: Venita Lick, MD;  Location: [facility] OR;  Service: Orthopedics;  Laterality: N/A;   ANTERIOR FUSION CERVICAL SPINE  MAY 2012   AT [facility]   ATRIAL FIBRILLATION ABLATION N/A 08/06/2020   Procedure: ATRIAL FIBRILLATION ABLATION;  Surgeon: Lanier Prude, MD;  [facility]: [facility] INVASIVE CV LAB;  Service: Cardiovascular;  Laterality: N/A;   ATRIAL FIBRILLATION ABLATION N/A 12/04/2022   Procedure: ATRIAL FIBRILLATION ABLATION;  Surgeon: Lanier Prude, MD;  [facility]: [facility] INVAS[facility] LAB;  Service: Cardiovascular;  Laterality: N/A;   BACK SURGERY  2009   LUMBAR FUSION WITH RODS    BREAST BIOPSY Right 2008   benign.- bx/clip   BREAST EXCISIONAL BIOPSY Left 1998   benign   BREAST REDUCTION SURGERY Bilateral 06/2016   CARDIAC ELECTROPHYSIOLOGY MAPPING AND ABLATION     CARDIOVERSION N/A 12/03/2019   Procedure: CARDIOVERSION;  Surgeon: Debbe Odea, MD;  Location: [facility] ORS;  Service: Cardiovascular;  Laterality: N/A;   CARDIOVERSION N/A 08/01/2021   Procedure: CARDIOVERSION;  Surgeon: Debbe Odea, MD;  Location: [facility] ORS;  Service: Cardiovascular;  Laterality: N/A;   CARDIOVERSION N/A 08/05/2021   Procedure: CARDIOVERSION;  Surgeon: Antonieta Iba, MD;  Location: [facility] ORS;  Service: Cardiovascular;  Laterality: N/A;   CARPAL TUNNEL RELEASE  05-06-12   Right   CHOLECYSTECTOMY      COLONOSCOPY WITH PROPOFOL N/A 01/26/2017   Procedure: COLONOSCOPY WITH PROPOFOL;  Surgeon: Midge Minium, MD;  Location: [facility] SURGERY CNTR;  Service: Endoscopy;  Laterality: N/A;   COLONOSCOPY WITH PROPOFOL N/A 01/16/2021   Procedure: COLONOSCOPY WITH PROPOFOL;  Surgeon: Toney Reil, MD;  Location: [facility] ENDOSCOPY;  Service: Gastroenterology;  Laterality: N/A;   DIAGNOSTIC LAPAROSCOPIES - MULTIPLE FOR ENDOMETRIOSIS     ESOPHAGEAL DILATION N/A 01/26/2017   Procedure: ESOPHAGEAL DILATION;  Surgeon: Midge Minium, MD;  Location: [facility] SURGERY CNTR;  Service: Endoscopy;  Laterality: N/A;   ESOPHAGEAL MANOMETRY N/A 05/30/2017   Procedure: ESOPHAGEAL MANOMETRY (EM);  Surgeon: Midge Minium, MD;  Location: [facility] ENDOSCOPY;  Service: Endoscopy;  Laterality: N/A;   ESOPHAGOGASTRODUODENOSCOPY (EGD) WITH PROPOFOL N/A 01/26/2017  Procedure: ESOPHAGOGASTRODUODENOSCOPY (EGD) WITH PROPOFOL;  Surgeon: Midge Minium, MD;  Location: [facility] SURGERY CNTR;  Service: Endoscopy;  Laterality: N/A;   ESOPHAGOGASTRODUODENOSCOPY (EGD) WITH PROPOFOL N/A 01/30/2020   Procedure: ESOPHAGOGASTRODUODENOSCOPY (EGD) WITH PROPOFOL;  Surgeon: Midge Minium, MD;  Location: [facility] SURGERY CNTR;  Service: Endoscopy;  Laterality: N/A;  sleep apnea COVID + 01-15-20   ESOPHAGOGASTRODUODENOSCOPY (EGD) WITH PROPOFOL N/A 01/15/2021   Procedure: ESOPHAGOGASTRODUODENOSCOPY (EGD) WITH PROPOFOL;  Surgeon: Toney Reil, MD;  Location: [facility] ENDOSCOPY;  Service: Gastroenterology;  Laterality: N/A;   ESOPHAGOGASTRODUODENOSCOPY (EGD) WITH PROPOFOL N/A 12/07/2021   Procedure: ESOPHAGOGASTRODUODENOSCOPY (EGD) WITH PROPOFOL;  Surgeon: Toney Reil, MD;  Location: [facility] ENDOSCOPY;  Service: Gastroenterology;  Laterality: N/A;   HIP ARTHROSCOPY  09/20/2011   Procedure: ARTHROSCOPY HIP;  Surgeon: Loanne Drilling, MD;  Location: [facility] ORS;  Service: Orthopedics;  Laterality: Right;  Right Hip Scope with Labral Debridement   LEFT HEART CATH AND  CORONARY ANGIOGRAPHY Left 06/10/2019   Procedure: LEFT HEART CATH AND CORONARY ANGIOGRAPHY;  Surgeon: Yvonne Kendall, MD;  Location: [facility] INVASIVE CV LAB;  Service: Cardiovascular;  Laterality: Left;   LOWER EXTREMITY ANGIOGRAPHY Left 01/06/2022   Procedure: Lower Extremity Angiography;  Surgeon: Renford Dills, MD;  Location: [facility] INVASIVE CV LAB;  Service: Cardiovascular;  Laterality: Left;   NASAL SEPTUM SURGERY  MARCH 2013   IN [place]   [facility] IMPEDANCE STUDY N/A 05/30/2017   Procedure: PH IMPEDANCE STUDY;  Surgeon: Midge Minium, MD;  Location: [facility] ENDOSCOPY;  Service: Endoscopy;  Laterality: N/A;   POLYPECTOMY N/A 01/26/2017   Procedure: POLYPECTOMY;  Surgeon: Midge Minium, MD;  Location: [facility] SURGERY CNTR;  Service: Endoscopy;  Laterality: N/A;   POSTERIOR CERVICAL FUSION/FORAMINOTOMY N/A 04/16/2013   Procedure: REMOVAL CERVICAL PLATES AND INTERBODY CAGE/POSTERIOR CERVICAL SPINAL FUSION C4 - C6/C5 CORPECTOMY/C4 - C6 FUSION WITH ILIAC CREST BONE GRAFT;  Surgeon: Venita Lick, MD;  Location: [facility] OR;  Service: Orthopedics;  Laterality: N/A;   RADIOFREQUENCY ABLATION NERVES     REDUCTION MAMMAPLASTY Bilateral 05/2016   RIGHT HIP ARTHROSCOPY FOR LABRAL TEAR  ABOUT 2010   2012 also   SHOULDER ARTHROSCOPY  05-06-12   bone spur   TEE WITHOUT CARDIOVERSION N/A 08/05/2020   Procedure: TRANSESOPHAGEAL ECHOCARDIOGRAM (TEE);  Surgeon: Lewayne Bunting, MD;  Location: [facility] ENDOSCOPY;  Service: Cardiovascular;  Laterality: N/A;   TONSILLECTOMY     AS A CHILD   TOTAL HIP ARTHROPLASTY Right 05/13/2012   Procedure: TOTAL HIP ARTHROPLASTY ANTERIOR APPROACH;  Surgeon: Loanne Drilling, MD;  Location: [facility] ORS;  Service: Orthopedics;  Laterality: Right;   Current Outpatient Medications:    acetaminophen (TYLENOL) 325 MG tablet, Take 2 tablets (650 mg total) by mouth every 6 (six) hours as needed for mild pain (or Fever >/= 101). (Patient taking differently: Take 1,000 mg by mouth every 6 (six) hours as  needed for mild pain (pain score 1-3) (or Fever >/= 101).), Disp: , Rfl:    ALPRAZolam (XANAX) 1 MG tablet, Take 1 mg by mouth at bedtime. , Disp: , Rfl:    dexlansoprazole (DEXILANT) 60 MG capsule, TAKE 1 CAPSULE BY MOUTH EVERY DAY, Disp: 90 capsule, Rfl: 1   diazepam (VALIUM) 5 MG tablet, PLACE 1 TABLET VAGINALLY NIGHTLY AS NEEDED FOR MUSCLE SPASM/ PELVIC PAIN., Disp: 10 tablet, Rfl: 0   diltiazem (CARDIZEM CD) 240 MG 24 hr capsule, Take 240 mg by mouth daily., Disp: , Rfl:    ELIQUIS 5 MG TABS tablet, TAKE 1 TABLET BY MOUTH TWICE A  DAY, Disp: 60 tablet, Rfl: 5   linaclotide (LINZESS) 290 MCG CAPS capsule, TAKE 1 CAPSULE BY MOUTH EVERY DAY BEFORE BREAKFAST, Disp: 30 capsule, Rfl: 1   Mometasone Furoate (ALLERGY NASAL SPRAY NA), Place 1 spray into the nose daily as needed (allergies/ decongestion)., Disp: , Rfl:    montelukast (SINGULAIR) 10 MG tablet, TAKE 1 TABLET BY MOUTH EVERY DAY, Disp: 90 tablet, Rfl: 1   morphine (MSIR) 15 MG tablet, Take 1 tablet (15 mg total) by mouth every 6 (six) hours as needed for moderate pain or severe pain. Must last 30 days., Disp: 120 tablet, Rfl: 0   naloxone (NARCAN) nasal spray 4 mg/0.1 mL, Place 1 spray into the nose as needed for up to 365 doses (for opioid-induced respiratory depresssion). In case of emergency (overdose), spray once into each nostril. If no response within 3 minutes, repeat application and call 911., Disp: 1 each, Rfl: 0   ondansetron (ZOFRAN-ODT) 4 MG disintegrating tablet, Take 1 tablet (4 mg total) by mouth every 8 (eight) hours as needed for nausea or vomiting., Disp: 12 tablet, Rfl: 0   phenazopyridine (PYRIDIUM) 100 MG tablet, Take 1-2 tablets (100-200 mg total) by mouth 3 (three) times daily as needed for pain., Disp: 20 tablet, Rfl: 0   valACYclovir (VALTREX) 1000 MG tablet, TAKE TWO TABLETS BY MOUTH TWICE DAILY FOR ONE DAY FEVER BLISTER, Disp: 4 tablet, Rfl: 5   [START ON 01/17/2023] morphine (MSIR) 15 MG tablet, Take 1 tablet (15 mg  total) by mouth every 6 (six) hours as needed for moderate pain or severe pain. Must last 30 days. (Patient not taking: Reported on 01/03/2023), Disp: 120 tablet, Rfl: 0   [START ON 02/16/2023] morphine (MSIR) 15 MG tablet, Take 1 tablet (15 mg total) by mouth every 6 (six) hours as needed for moderate pain or severe pain. Must last 30 days. (Patient not taking: Reported on 01/03/2023), Disp: 120 tablet, Rfl: 0    Family History  Problem Relation Age of Onset   Aneurysm Mother    Bipolar disorder Sister    Aneurysm Maternal Grandmother    Breast cancer Paternal Grandmother 71   Bipolar disorder Grandchild    Anxiety disorder Grandchild    Depression Grandchild    Cancer Neg Hx      Social History   Tobacco Use   Smoking status: Never   Smokeless tobacco: Never   Tobacco comments:    Never smoked 01/01/23  Vaping Use   Vaping status: Never Used  Substance Use Topics   Alcohol use: No    Alcohol/week: 0.0 standard drinks of alcohol   Drug use: Yes    Comment: prescribed morphine and xanax    Allergies as of 01/03/2023 - Review Complete 01/03/2023  Allergen Reaction Noted   Amoxicillin Hives 08/20/2014   Chlorhexidine gluconate Dermatitis and Hives 07/06/2016   Clindamycin Hives 08/20/2014   Codeine Hives 08/21/2011   Erythromycin Hives 07/06/2016   Penicillin g Hives 07/06/2016   Sulfa antibiotics Nausea And Vomiting and Hives 09/06/2011   Levofloxacin Hives 02/18/2018   Shellfish allergy Hives 10/08/2012   Decadron [dexamethasone] Other (See Comments) 09/20/2011   Flecainide Swelling 09/06/2022   Mangifera indica Hives 07/20/2016   Papaya derivatives Hives 10/30/2012   Betadine [povidone iodine] Hives 09/06/2011   Clarithromycin Hives 09/08/2011   Other Hives 09/06/2011   Povidone-iodine Hives 09/06/2011   Prednisone Anxiety 08/18/2014    Review of Systems:    All systems reviewed and negative except where noted in  HPI.   Physical Exam:  BP 139/82 (BP  Location: Left Arm, Patient Position: Sitting, Cuff Size: Normal)   Pulse 68   Temp 98 F (36.7 C) (Oral)   Ht 5\' 3"  (1.6 m)   Wt 148 lb 4 oz (67.2 kg)   BMI 26.26 kg/m  No LMP recorded. Patient has had a hysterectomy.  General:   Alert,  Well-developed, well-nourished, pleasant and cooperative in NAD Head:  Normocephalic and atraumatic. Eyes:  Sclera clear, no icterus.   Conjunctiva pink. Ears:  Normal auditory acuity. Nose:  No deformity, discharge, or lesions. Mouth:  No deformity or lesions,oropharynx pink & moist. Neck:  Supple; no masses or thyromegaly. Lungs:  Respirations even and unlabored.  Clear throughout to auscultation.   No wheezes, crackles, or rhonchi. No acute distress. Heart:  Regular rate and rhythm; no murmurs, clicks, rubs, or gallops. Abdomen:  Normal bowel sounds. Soft, nontender, mildly distended, tympanic to percussion without masses, hepatosplenomegaly or hernias noted.  No guarding or rebound tenderness.   Rectal: Not performed Msk:  Symmetrical without gross deformities. Good, equal movement & strength bilaterally. Pulses:  Normal pulses noted. Extremities:  No clubbing or edema.  No cyanosis. Neurologic:  Alert and oriented x3;  grossly normal neurologically. Skin:  Intact without significant lesions or rashes. No jaundice. Psych:  Alert and cooperative. Normal mood and affect.  Imaging Studies: Reviewed  Assessment and Plan:   [name] is a 62 y.o. female with history of A. fib/a flutter s/p ablation on Eliquis, s/p Nissen's fundoplication in 2021, s/p cholecystectomy is seen in consultation for exocrine pancreatic insufficiency, abdominal bloating and worsening of acid reflux  Flareup of GERD secondary to recent weight gain Patient is consuming significant amount of carbs and sweets which is triggering her reflux Discussed with patient regarding role of diet causing worsening of reflux symptoms Based on EGD from 11/2021, the wrap appeared  intact and there is no evidence of erosive esophagitis or esophageal stricture Switch to Dexilant 60 mg daily, prescription sent  Chronic constipation TSH normal Patient's diet is devoid of fiber and adequate intake of water Reiterated on high-fiber diet and fiber supplements, information provided Strongly advised patient to eliminate artificial sweeteners, ice tea, regular intake of starchy foods  Moderate exocrine pancreatic insufficiency No evidence of pancreatic lesions based on cross-sectional imaging Continue Creon 36 K 1 to 2 capsules with each meal and 1 with snack  Iron deficiency anemia: Currently resolved S/p parenteral iron therapy EGD and colonoscopy are unremarkable Deferred video capsule endoscopy at this time   Follow up in 6 months   Arlyss Repress, MD

## 2023-01-03 NOTE — Patient Instructions (Signed)
Golytely Fill to the fill line with water or Gatorade and mix together. Drink 8oz every 20 to 30 minutes till gone and drink half of the container. Gave samples of Restora if they help you can buy them over the counter.  Sent Pantoprazole to your pharmacy  Sent  Movantik 25mg  to your pharmacy.

## 2023-01-03 NOTE — Telephone Encounter (Signed)
Chief Complaint: Vaginal itching Symptoms: Moderate to severe vaginal itching  Frequency: constant Pertinent Negatives: Patient denies discharge, pain, fever Disposition: [] ED /[] Urgent Care (no appt availability in office) / [] Appointment(In office/virtual)/ []  [facility]/ [] Home Care/ [] Refused Recommended Disposition /[] [place] [facility]/ [x]  Follow-up with PCP Additional Notes: Patient states she just finished a course of antibiotics for a UTI was taking Keflex. Patient now report moderate to severe vaginal itching. Patient is requesting treatment for a [facility] infection [facility] [place]. Care advice given and patient offered an appointment. Patient declined an appointment at this time and continued to request an Rx for Diflucan be sent to her pharmacy. Advised patient I would forward the request to PCP for recommendations.   Summary: Rx request   Pt reports that she just finished the antibiotic and now she has a yeast infection. Pt requests that a Rx for diflucan [id number] [place] - [address] Phone: [phone]  Fax: [phone]     Reason for Disposition  MODERATE-SEVERE itching (i.e., interferes with school, work, or sleep)  Answer Assessment - Initial Assessment Questions 1. SYMPTOM: "What's the main symptom you're concerned about?" (e.g., pain, itching, dryness)     Itching 2. LOCATION: "Where is the  itching located?" (e.g., inside/outside, left/right)     All over  3. ONSET: "When did the  itching  start?"     Yesterday  4. PAIN: "Is there any pain?" If Yes, ask: "How bad is it?" (Scale: 1-10; mild, moderate, severe)   -  MILD (1-3): Doesn't interfere with normal activities.    -  MODERATE (4-7): Interferes with normal activities (e.g., work or school) or awakens from sleep.     -  SEVERE (8-10): Excruciating pain, unable to do any normal activities.     No  5. ITCHING: "Is there any itching?" If Yes, ask: "How  bad is it?" (Scale: 1-10; mild, moderate, severe)     Moderate to Severe 6. CAUSE: "What do you think is causing the discharge?" "Have you had the same problem before? What happened then?"     I finished my antibiotic and I think it is causing a yeast infection  7. OTHER SYMPTOMS: "Do you have any other symptoms?" (e.g., fever, itching, vaginal bleeding, pain with urination, injury to genital area, vaginal foreign body)     Just itching, lower abdominal discomfort  Protocols used: Vaginal Symptoms-A-AH

## 2023-01-03 NOTE — Addendum Note (Signed)
Addended by: Jacquenette Shone on: 01/03/2023 06:07 PM   Modules accepted: Orders

## 2023-01-15 ENCOUNTER — Telehealth: Payer: Self-pay | Admitting: Pain Medicine

## 2023-01-15 NOTE — Telephone Encounter (Signed)
PT was suppose to get an MRI done back in June , however patient stated that when she got to [facility] image they didn't have a open MRI. PT will like to know if [name] will place an new order so she can go to the one in [place]. Please give patient a call. TY

## 2023-01-15 NOTE — Telephone Encounter (Signed)
Apparently, the order has expired. Will you reorder?

## 2023-01-22 ENCOUNTER — Encounter: Payer: Medicare Other | Admitting: Family Medicine

## 2023-02-12 ENCOUNTER — Other Ambulatory Visit: Payer: Self-pay | Admitting: Family Medicine

## 2023-02-12 DIAGNOSIS — J45909 Unspecified asthma, uncomplicated: Secondary | ICD-10-CM

## 2023-02-14 ENCOUNTER — Other Ambulatory Visit (INDEPENDENT_AMBULATORY_CARE_PROVIDER_SITE_OTHER): Payer: Self-pay | Admitting: Vascular Surgery

## 2023-02-14 DIAGNOSIS — I729 Aneurysm of unspecified site: Secondary | ICD-10-CM

## 2023-02-15 ENCOUNTER — Ambulatory Visit (INDEPENDENT_AMBULATORY_CARE_PROVIDER_SITE_OTHER): Payer: Medicare Other | Admitting: Vascular Surgery

## 2023-02-15 ENCOUNTER — Ambulatory Visit (INDEPENDENT_AMBULATORY_CARE_PROVIDER_SITE_OTHER): Payer: Medicare Other

## 2023-02-15 ENCOUNTER — Encounter (INDEPENDENT_AMBULATORY_CARE_PROVIDER_SITE_OTHER): Payer: Self-pay | Admitting: Vascular Surgery

## 2023-02-15 VITALS — BP 106/74 | HR 70 | Resp 16 | Wt 146.4 lb

## 2023-02-15 DIAGNOSIS — J4520 Mild intermittent asthma, uncomplicated: Secondary | ICD-10-CM

## 2023-02-15 DIAGNOSIS — I729 Aneurysm of unspecified site: Secondary | ICD-10-CM

## 2023-02-15 DIAGNOSIS — I480 Paroxysmal atrial fibrillation: Secondary | ICD-10-CM | POA: Diagnosis not present

## 2023-02-15 DIAGNOSIS — I10 Essential (primary) hypertension: Secondary | ICD-10-CM | POA: Diagnosis not present

## 2023-02-15 DIAGNOSIS — I724 Aneurysm of artery of lower extremity: Secondary | ICD-10-CM | POA: Diagnosis not present

## 2023-02-15 DIAGNOSIS — M5137 Other intervertebral disc degeneration, lumbosacral region with discogenic back pain only: Secondary | ICD-10-CM | POA: Diagnosis not present

## 2023-02-17 ENCOUNTER — Other Ambulatory Visit: Payer: Self-pay | Admitting: Gastroenterology

## 2023-02-18 ENCOUNTER — Encounter (INDEPENDENT_AMBULATORY_CARE_PROVIDER_SITE_OTHER): Payer: Self-pay | Admitting: Vascular Surgery

## 2023-02-18 NOTE — Progress Notes (Signed)
MRN : [id number]  [name] is a 62 y.o. (1960/05/14) female who presents with chief complaint of check circulation.  History of Present Illness:   The patient returns to the office for followup regarding atherosclerotic changes of the lower extremities and review of the noninvasive studies.   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   There have been no interval changes in lower extremity symptoms. No interval shortening of the patient's claudication distance or development of rest pain symptoms. No new ulcers or wounds have occurred since the last visit.  There have been no significant changes to the patient's overall health care.  The patient denies amaurosis fugax or recent TIA symptoms. There are no documented recent neurological changes noted. There is no history of DVT, PE or superficial thrombophlebitis. The patient denies recent episodes of angina or shortness of breath.   Duplex ultrasound of the left lower extremity demonstrates uniform velocities throughout the arterial system.  No evidence of stricture or stenosis of the SFA or common femoral.  Current Meds  Medication Sig   acetaminophen (TYLENOL) 325 MG tablet Take 2 tablets (650 mg total) by mouth every 6 (six) hours as needed for mild pain (or Fever >/= 101). (Patient taking differently: Take 1,000 mg by mouth every 6 (six) hours as needed for mild pain (pain score 1-3) (or Fever >/= 101).)   ALPRAZolam (XANAX) 1 MG tablet Take 1 mg by mouth at bedtime.    diazepam (VALIUM) 5 MG tablet PLACE 1 TABLET VAGINALLY NIGHTLY AS NEEDED FOR MUSCLE SPASM/ PELVIC PAIN.   diltiazem (CARDIZEM CD) 240 MG 24 hr capsule Take 240 mg by mouth daily.   ELIQUIS 5 MG TABS tablet TAKE 1 TABLET BY MOUTH TWICE A DAY   linaclotide (LINZESS) 290 MCG CAPS capsule TAKE 1 CAPSULE BY MOUTH EVERY DAY BEFORE  BREAKFAST   Mometasone Furoate (ALLERGY NASAL SPRAY NA) Place 1 spray into the nose daily as needed (allergies/ decongestion).   montelukast (SINGULAIR) 10 MG tablet TAKE 1 TABLET BY MOUTH EVERY DAY   naloxone (NARCAN) nasal spray 4 mg/0.1 mL Place 1 spray into the nose as needed for up to 365 doses (for opioid-induced respiratory depresssion). In case of emergency (overdose), spray once into each nostril. If no response within 3 minutes, repeat application and call 911.   ondansetron (ZOFRAN-ODT) 4 MG disintegrating tablet Take 1 tablet (4 mg total) by mouth every 8 (eight) hours as needed for nausea or vomiting.   pantoprazole (PROTONIX) 40 MG tablet Take 1 tablet (40 mg total) by mouth daily before breakfast.   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    Chronic headaches    Chronic, continuous use of opioids    DDD (degenerative disc disease)    CERVICAL AND LUMBAR-CHRONIC PAIN, RT HIP LABRAL TEAR   Depression    PT STATES A LOT OF STRESS IN HER LIFE   Dissociative disorder    Dizziness 04/22/2013   Duodenal ulcer with hemorrhage and perforation (HCC) 04/27/2003   Foot drop, right    FROM BACK SURGERY   GERD (gastroesophageal reflux disease)    H/O arthrodesis 08/18/2014   Headache(784.0)    AND NECK PAIN--STATES RECENT TEST SHOW CERVICAL DEGENERATION   History of blood transfusion    s/p back surgery   History of cardiac cath    a. 05/2019 Cath: Nl cors. EF 55-65%.   History of cardioversion    History of  cervical spinal surgery 01/04/2015   History of kidney stones    Hypertension    Inverted T wave    Iron deficiency 02/01/2021   Mitral regurgitation    a. 07/2020 Echo: EF 60-65%, no rwma. Nl RV size/fxn. RVSP 39.61mmHg. Mildly to mod dil LA. Mod MR; b. 07/2020 TEE: EF 55-60%. Lambl's excresence. Nl RV size/fxn. Midly dil LA. No LA/LAA thrombus. Mild MR.   Narrowing of intervertebral disc space 08/18/2014   Currently on disability.    Orthostatic hypotension 04/22/2013   Pain    CHRONIC NECK AND BACK PAIN - LIMITED ROM NECK - S/P FUSIONS CERVICAL AND LUMBAR   Paroxysmal atrial fibrillation (HCC) 2021   a. 07/2020 s/p PVI.   Pneumonia    PONV (postoperative nausea and vomiting)    PT GIVES HX OF N&V AND FEVER WITH SURGERIES YEARS AGO--BUT NO PROBLEMS WITH MORE RECENT SURGERIES--STATES NOT MALIGNANT HYPERTHERMIA   Postop Hyponatremia 05/14/2012   Postoperative anemia due to acute blood loss 05/14/2012   PTSD (post-traumatic stress disorder)    Right foot drop    Right hip arthralgia 08/18/2014   Status post surgery of right, and now needs left.    Sleep apnea 2021   does not have a cpap   Therapeutic opioid-induced constipation (OIC)    Typical atrial flutter [facility])     Past Surgical History:  Procedure Laterality Date   ABDOMINAL HYSTERECTOMY     afib  05/2019   ANTERIOR CERVICAL DECOMP/DISCECTOMY FUSION N/A 10/30/2012   Procedure: ACDF C5-6, EXPLORATION AND HARDWARE REMOVAL C6-7;  Surgeon: Venita Lick, MD;  Location: [facility] OR;  Service: Orthopedics;  Laterality: N/A;   ANTERIOR FUSION CERVICAL SPINE  MAY 2012   AT [facility]   ATRIAL FIBRILLATION ABLATION N/A 08/06/2020   Procedure: ATRIAL FIBRILLATION ABLATION;  Surgeon: Lanier Prude, MD;  Location: [facility] INV[facility];  Service: Cardiovascular;  Laterality: N/A;   ATRIAL FIBRILLATION ABLATION N/A 12/04/2022   Procedure: ATRIAL FIBRILLATION ABLATION;  Surgeon: Lanier Prude, MD;  Location: [facility];  Service:  Cardiovascular;  Laterality: N/A;   BACK SURGERY  2009   LUMBAR FUSION WITH RODS    BREAST BIOPSY Right 2008   benign.- bx/clip   BREAST EXCISIONAL BIOPSY Left 1998   benign   BREAST REDUCTION SURGERY Bilateral 06/2016   CARDIAC ELECTROPHYSIOLOGY MAPPING AND ABLATION     CARDIOVERSION N/A 12/03/2019   Procedure: CARDIOVERSION;  Surgeon: Debbe Odea, MD;  Location: [facility] ORS;  Service: Cardiovascular;  Laterality: N/A;   CARDIOVERSION  N/A 08/01/2021   Procedure: CARDIOVERSION;  Surgeon: Debbe Odea, MD;  Location: [facility] ORS;  Service: Cardiovascular;  Laterality: N/A;   CARDIOVERSION N/A 08/05/2021   Procedure: CARDIOVERSION;  Surgeon: Antonieta Iba, MD;  Location: [facility] ORS;  Service: Cardiovascular;  Laterality: N/A;   CARPAL TUNNEL RELEASE  05-06-12   Right   CHOLECYSTECTOMY     COLONOSCOPY WITH PROPOFOL N/A 01/26/2017   Procedure: COLONOSCOPY WITH PROPOFOL;  Surgeon: Midge Minium, MD;  Location: [facility] SURGERY CNTR;  Service: Endoscopy;  Laterality: N/A;   COLONOSCOPY WITH PROPOFOL N/A 01/16/2021   Procedure: COLONOSCOPY WITH PROPOFOL;  Surgeon: Toney Reil, MD;  Location: [facility] ENDOSCOPY;  Service: Gastroenterology;  Laterality: N/A;   DIAGNOSTIC LAPAROSCOPIES - MULTIPLE FOR ENDOMETRIOSIS     ESOPHAGEAL DILATION N/A 01/26/2017   Procedure: ESOPHAGEAL DILATION;  Surgeon: Midge Minium, MD;  Location: [facility] SURGERY CNTR;  Service: Endoscopy;  Laterality: N/A;   ESOPHAGEAL MANOMETRY N/A 05/30/2017   Procedure: ESOPHAGEAL MANOMETRY (EM);  Surgeon: Midge Minium, MD;  Location: [facility] ENDOSCOPY;  Service: Endoscopy;  Laterality: N/A;   ESOPHAGOGASTRODUODENOSCOPY (EGD) WITH PROPOFOL N/A 01/26/2017   Procedure: ESOPHAGOGASTRODUODENOSCOPY (EGD) WITH PROPOFOL;  Surgeon: Midge Minium, MD;  Location: [facility] SURGERY CNTR;  Service: Endoscopy;  Laterality: N/A;   ESOPHAGOGASTRODUODENOSCOPY (EGD) WITH PROPOFOL N/A 01/30/2020   Procedure: ESOPHAGOGASTRODUODENOSCOPY (EGD) WITH  PROPOFOL;  Surgeon: Midge Minium, MD;  Location: [facility] SURGERY CNTR;  Service: Endoscopy;  Laterality: N/A;  sleep apnea COVID + 01-15-20   ESOPHAGOGASTRODUODENOSCOPY (EGD) WITH PROPOFOL N/A 01/15/2021   Procedure: ESOPHAGOGASTRODUODENOSCOPY (EGD) WITH PROPOFOL;  Surgeon: Toney Reil, MD;  Location: [facility] ENDOSCOPY;  Service: Gastroenterology;  Laterality: N/A;   ESOPHAGOGASTRODUODENOSCOPY (EGD) WITH PROPOFOL N/A 12/07/2021   Procedure: ESOPHAGOGASTRODUODENOSCOPY (EGD) WITH PROPOFOL;  Surgeon: Toney Reil, MD;  Location: [facility] ENDOSCOPY;  Service: Gastroenterology;  Laterality: N/A;   HIP ARTHROSCOPY  09/20/2011   Procedure: ARTHROSCOPY HIP;  Surgeon: Loanne Drilling, MD;  Location: [facility] ORS;  Service: Orthopedics;  Laterality: Right;  Right Hip Scope with Labral Debridement   LEFT HEART CATH AND CORONARY ANGIOGRAPHY Left 06/10/2019   Procedure: LEFT HEART CATH AND CORONARY ANGIOGRAPHY;  Surgeon: Yvonne Kendall, MD;  Location: [facility] INVASIVE CV LAB;  Service: Cardiovascular;  Laterality: Left;   LOWER EXTREMITY ANGIOGRAPHY Left 01/06/2022   Procedure: Lower Extremity Angiography;  Surgeon: Renford Dills, MD;  Location: [facility] INVASIVE CV LAB;  Service: Cardiovascular;  Laterality: Left;   NASAL SEPTUM SURGERY  MARCH 2013   IN [place]   [facility] IMPEDANCE STUDY N/A 05/30/2017   Procedure: PH IMPEDANCE STUDY;  Surgeon: Midge Minium, MD;  Location: [facility] ENDOSCOPY;  Service: Endoscopy;  Laterality: N/A;   POLYPECTOMY N/A 01/26/2017   Procedure: POLYPECTOMY;  Surgeon: Midge Minium, MD;  Location: [facility] SURGERY CNTR;  Service: Endoscopy;  Laterality: N/A;   POSTERIOR CERVICAL FUSION/FORAMINOTOMY N/A 04/16/2013   Procedure: REMOVAL CERVICAL PLATES AND INTERBODY CAGE/POSTERIOR CERVICAL SPINAL FUSION C4 - C6/C5 CORPECTOMY/C4 - C6 FUSION WITH ILIAC CREST BONE GRAFT;  Surgeon: Venita Lick, MD;  Location: [facility] OR;  Service: Orthopedics;  Laterality: N/A;   RADIOFREQUENCY ABLATION NERVES      REDUCTION MAMMAPLASTY Bilateral 05/2016   RIGHT HIP ARTHROSCOPY FOR LABRAL TEAR  ABOUT 2010   2012 also   SHOULDER ARTHROSCOPY  05-06-12   bone spur   TEE WITHOUT CARDIOVERSION N/A 08/05/2020   Procedure: TRANSESOPHAGEAL ECHOCARDIOGRAM (TEE);  Surgeon: Lewayne Bunting, MD;  Location: [facility] ENDOSCOPY;  Service: Cardiovascular;  Laterality: N/A;   TONSILLECTOMY  AS A CHILD   TOTAL HIP ARTHROPLASTY Right 05/13/2012   Procedure: TOTAL HIP ARTHROPLASTY ANTERIOR APPROACH;  Surgeon: Loanne Drilling, MD;  Location: [facility] ORS;  Service: Orthopedics;  Laterality: Right;    Social History Social History   Tobacco Use   Smoking status: Never   Smokeless tobacco: Never   Tobacco comments:    Never smoked 01/01/23  Vaping Use   Vaping status: Never Used  Substance Use Topics   Alcohol use: No    Alcohol/week: 0.0 standard drinks of alcohol   Drug use: Yes    Comment: prescribed morphine and xanax    Family History Family History  Problem Relation Age of Onset   Aneurysm Mother    Bipolar disorder Sister    Aneurysm Maternal Grandmother    Breast cancer Paternal Grandmother 60   Bipolar disorder Grandchild    Anxiety disorder Grandchild    Depression Grandchild    Cancer Neg Hx     Allergies  Allergen Reactions   Amoxicillin Hives    She did ok w ANCEF   Chlorhexidine Gluconate Dermatitis and Hives   Clindamycin Hives   Codeine Hives   Erythromycin Hives    "mycins" in general   Penicillin G Hives    "cillins" in general   Sulfa Antibiotics Nausea And Vomiting and Hives        Levofloxacin Hives   Shellfish Allergy Hives   Decadron [Dexamethasone] Other (See Comments)    Hot flashes, insomnia, "manic" Hot flashes, insomnia, "manic"   Flecainide Swelling    Pts said she signs of going into heart failure. Adema   Mangifera Indica Hives    papaya   Papaya Derivatives Hives   Betadine [Povidone Iodine] Hives        Clarithromycin Hives   Other Hives     Mango     Povidone-Iodine Hives   Prednisone Anxiety    High blood pressure, flushed, mood changes, heart palpitations High blood pressure, flushed, mood changes, heart palpitations     REVIEW OF SYSTEMS (Negative unless checked)  Constitutional: [] Weight loss  [] Fever  [] Chills Cardiac: [] Chest pain   [] Chest pressure   [] Palpitations   [] Shortness of breath when laying flat   [] Shortness of breath with exertion. Vascular:  [x] Pain in legs with walking   [] Pain in legs at rest  [] History of DVT   [] Phlebitis   [] Swelling in legs   [] Varicose veins   [] Non-healing ulcers Pulmonary:   [] Uses home oxygen   [] Productive cough   [] Hemoptysis   [] Wheeze  [] COPD   [] Asthma Neurologic:  [] Dizziness   [] Seizures   [] History of stroke   [] History of TIA  [] Aphasia   [] Vissual changes   [] Weakness or numbness in arm   [] Weakness or numbness in leg Musculoskeletal:   [] Joint swelling   [] Joint pain   [] Low back pain Hematologic:  [] Easy bruising  [] Easy bleeding   [] Hypercoagulable state   [] Anemic Gastrointestinal:  [] Diarrhea   [] Vomiting  [] Gastroesophageal reflux/heartburn   [] Difficulty swallowing. Genitourinary:  [] Chronic kidney disease   [] Difficult urination  [] Frequent urination   [] Blood in urine Skin:  [] Rashes   [] Ulcers  Psychological:  [] History of anxiety   []  History of major depression.  Physical Examination  Vitals:   02/15/23 1403  BP: 106/74  Pulse: 70  Resp: 16  Weight: 146 lb 6.4 oz (66.4 kg)   Body mass index is 25.93 kg/m. Gen: WD/WN, NAD Head: [place]/AT, No temporalis wasting.  Ear/Nose/Throat:  Hearing grossly intact, nares w/o erythema or drainage Eyes: PER, EOMI, sclera nonicteric.  Neck: Supple, no masses.  No bruit or JVD.  Pulmonary:  Good air movement, no audible wheezing, no use of accessory muscles.  Cardiac: RRR, normal S1, S2, no Murmurs. Vascular:  mild trophic changes, no open wounds Vessel Right Left  Radial Palpable Palpable  PT Palpable  Palpable  DP  Palpable  Palpable  Gastrointestinal: soft, non-distended. No guarding/no peritoneal signs.  Musculoskeletal: M/S 5/5 throughout.  No visible deformity.  Neurologic: CN 2-12 intact. Pain and light touch intact in extremities.  Symmetrical.  Speech is fluent. Motor exam as listed above. Psychiatric: Judgment intact, Mood & affect appropriate for pt's clinical situation. Dermatologic: No rashes or ulcers noted.  No changes consistent with cellulitis.   CBC Lab Results  Component Value Date   WBC 6.9 12/04/2022   HGB 12.3 12/04/2022   HCT 37.9 12/04/2022   MCV 86.7 12/04/2022   PLT 345 12/04/2022    BMET    Component Value Date/Time   NA 136 12/04/2022 0549   NA 140 01/19/2021 1055   NA 140 02/26/2013 1315   K 3.4 (L) 12/04/2022 0549   K 4.0 02/26/2013 1315   CL 103 12/04/2022 0549   CL 108 (H) 02/26/2013 1315   CO2 22 12/04/2022 0549   CO2 27 02/26/2013 1315   GLUCOSE 108 (H) 12/04/2022 0549   GLUCOSE 95 02/26/2013 1315   BUN 5 (L) 12/04/2022 0549   BUN 7 (L) 01/19/2021 1055   BUN 13 02/26/2013 1315   CREATININE 0.69 12/04/2022 0549   CREATININE 0.83 11/03/2022 1259   CREATININE 0.85 12/07/2016 1228   CALCIUM 9.0 12/04/2022 0549   CALCIUM 9.2 02/26/2013 1315   GFRNONAA >60 12/04/2022 0549   GFRNONAA >60 11/03/2022 1259   GFRNONAA >60 02/26/2013 1315   GFRAA >60 11/26/2019 1213   GFRAA >60 02/26/2013 1315   CrCl cannot be calculated (Patient's most recent lab result is older than the maximum 21 days allowed.).  COAG Lab Results  Component Value Date   INR 1.2 01/03/2022   INR 1.6 (H) 08/05/2021   INR 1.2 02/20/2020    Radiology No results found.   Assessment/Plan 1. Pseudoaneurysm of left femoral artery (HCC)  Recommend:  The patient has evidence of atherosclerosis of the lower extremities with claudication.  The patient does not voice lifestyle limiting changes at this point in time.  Noninvasive studies do not suggest clinically significant  change.  No invasive studies, angiography or surgery at this time The patient should continue walking and begin a more formal exercise program.  The patient should continue antiplatelet therapy and aggressive treatment of the lipid abnormalities  No changes in the patient's medications at this time  Continued surveillance is indicated as atherosclerosis is likely to progress with time.    The patient will continue follow up with noninvasive studies as ordered.   2. Paroxysmal atrial fibrillation (HCC) Continue antiarrhythmia medications as already ordered, these medications have been reviewed and there are no changes at this time.  Continue anticoagulation as ordered by Cardiology Service  3. Primary hypertension Continue antihypertensive medications as already ordered, these medications have been reviewed and there are no changes at this time.  4. Mild intermittent asthma, unspecified whether complicated Continue pulmonary medications and aerosols as already ordered, these medications have been reviewed and there are no changes at this time.   5. Degeneration of intervertebral disc of lumbosacral region with discogenic back pain Continue  medications to treat the patient's degenerative disease as already ordered, these medications have been reviewed and there are no changes at this time.  Continued activity and therapy was stressed.    Levora Dredge, MD  02/18/2023 1:13 PM

## 2023-02-19 MED ORDER — LINACLOTIDE 290 MCG PO CAPS: 290.0000 ug | ORAL_CAPSULE | Freq: Every day | ORAL | 5 refills | Status: DC

## 2023-02-19 NOTE — Telephone Encounter (Signed)
Called patient and left a message for call back  

## 2023-02-19 NOTE — Addendum Note (Signed)
Addended by: Radene Knee L on: 02/19/2023 11:31 AM   Modules accepted: Orders

## 2023-02-19 NOTE — Telephone Encounter (Signed)
Sent medication to pharmacy.   

## 2023-02-19 NOTE — Telephone Encounter (Signed)
Her pharmacist told her not to take it because it would put her in immediately in withdrawals because she takes Morphine daily. She states she did not fill the Movantik and wants to stay on the Linzess. Please advise if we can refill the Linzess 290

## 2023-02-19 NOTE — Telephone Encounter (Signed)
Last office visit 01/03/23 Constipation  Plan Chronic opioid-induced constipation Linzess 290 mcg is not helping She tried MiraLAX and lactulose in the past with no relief Sent in prescription for Movantik Last refill 11/30/2022 1 refill  Sent mychart message to find out which medication she is taking

## 2023-02-20 ENCOUNTER — Encounter: Payer: Self-pay | Admitting: Family Medicine

## 2023-02-20 ENCOUNTER — Ambulatory Visit (INDEPENDENT_AMBULATORY_CARE_PROVIDER_SITE_OTHER): Payer: Medicare Other | Admitting: Family Medicine

## 2023-02-20 VITALS — BP 126/79 | HR 67 | Resp 16 | Ht 63.0 in | Wt 148.6 lb

## 2023-02-20 DIAGNOSIS — Z0000 Encounter for general adult medical examination without abnormal findings: Secondary | ICD-10-CM

## 2023-02-20 DIAGNOSIS — E7800 Pure hypercholesterolemia, unspecified: Secondary | ICD-10-CM

## 2023-02-20 DIAGNOSIS — M26622 Arthralgia of left temporomandibular joint: Secondary | ICD-10-CM

## 2023-02-20 DIAGNOSIS — R739 Hyperglycemia, unspecified: Secondary | ICD-10-CM

## 2023-02-20 DIAGNOSIS — Z23 Encounter for immunization: Secondary | ICD-10-CM | POA: Diagnosis not present

## 2023-02-20 DIAGNOSIS — Z1231 Encounter for screening mammogram for malignant neoplasm of breast: Secondary | ICD-10-CM

## 2023-02-20 DIAGNOSIS — I10 Essential (primary) hypertension: Secondary | ICD-10-CM | POA: Diagnosis not present

## 2023-02-20 DIAGNOSIS — E559 Vitamin D deficiency, unspecified: Secondary | ICD-10-CM

## 2023-02-20 DIAGNOSIS — I4811 Longstanding persistent atrial fibrillation: Secondary | ICD-10-CM | POA: Diagnosis not present

## 2023-02-20 NOTE — Progress Notes (Signed)
Complete physical exam  Patient: [name]   DOB: 09-11-1960   62 y.o. Female  MRN: [id number]  Subjective:    Chief Complaint  Patient presents with   Annual Exam    [name] is a 62 y.o. female who presents today for a complete physical exam. She reports consuming a general diet.  She generally feels fairly well. She reports sleeping fairly well. She does have additional problems to discuss today.   Discussed the use of AI scribe software for clinical note transcription with the patient, who gave verbal consent to proceed.  History of Present Illness   The patient, with a history of back problems and degenerative disc disease, presents for a physical examination. The patient reports experiencing significant pain after receiving massages at a spa, which has led to the discontinuation of these services. The patient also complains of pain in the left side of her jaw, which has been exacerbated by chewing hard foods. The patient mentions a history of teeth grinding and suspects this could be contributing to the jaw pain. The patient also expresses concern about elevated blood sugar levels, noting a high intake of sweet tea and a lack of appetite for other foods.       Most recent fall risk assessment:    02/20/2023    1:31 PM  Fall Risk   Falls in the past year? 1  Number falls in past yr: 1  Injury with Fall? 0  Risk for fall due to : No Fall Risks     Most recent depression screenings:    02/20/2023    1:32 PM 12/29/2022    1:03 PM  PHQ 2/9 Scores  PHQ - 2 Score 0 0  PHQ- 9 Score  0        Patient Care Team: Erasmo Downer, MD as PCP - General (Family Medicine) Lanier Prude, MD as PCP - Electrophysiology (Cardiology) Debbe Odea, MD as PCP - Cardiology (Cardiology) Sisk, Benjaman Pott, NP as Nurse Practitioner (Psychiatry) Midge Minium, MD as Consulting Physician (Gastroenterology) Leafy Ro, MD as Consulting Physician (General  Surgery) Delano Metz, MD as Referring Physician (Pain Medicine) Pcp, No Sterling Big, DMD (Dentistry) Earna Coder, MD as Consulting Physician (Internal Medicine) Anthonette Legato, OD (Optometry)   Outpatient Medications Prior to Visit  Medication Sig   acetaminophen (TYLENOL) 325 MG tablet Take 2 tablets (650 mg total) by mouth every 6 (six) hours as needed for mild pain (or Fever >/= 101). (Patient taking differently: Take 1,000 mg by mouth every 6 (six) hours as needed for mild pain (pain score 1-3) (or Fever >/= 101).)   ALPRAZolam (XANAX) 1 MG tablet Take 1 mg by mouth at bedtime.    diazepam (VALIUM) 5 MG tablet PLACE 1 TABLET VAGINALLY NIGHTLY AS NEEDED FOR MUSCLE SPASM/ PELVIC PAIN.   diltiazem (CARDIZEM CD) 240 MG 24 hr capsule Take 240 mg by mouth daily.   ELIQUIS 5 MG TABS tablet TAKE 1 TABLET BY MOUTH TWICE A DAY   linaclotide (LINZESS) 290 MCG CAPS capsule Take 1 capsule (290 mcg total) by mouth daily before breakfast.   Mometasone Furoate (ALLERGY NASAL SPRAY NA) Place 1 spray into the nose daily as needed (allergies/ decongestion).   montelukast (SINGULAIR) 10 MG tablet TAKE 1 TABLET BY MOUTH EVERY DAY   morphine (MSIR) 15 MG tablet Take 1 tablet (15 mg total) by mouth every 6 (six) hours as needed for moderate pain or severe  pain. Must last 30 days.   naloxone (NARCAN) nasal spray 4 mg/0.1 mL Place 1 spray into the nose as needed for up to 365 doses (for opioid-induced respiratory depresssion). In case of emergency (overdose), spray once into each nostril. If no response within 3 minutes, repeat application and call 911.   ondansetron (ZOFRAN-ODT) 4 MG disintegrating tablet Take 1 tablet (4 mg total) by mouth every 8 (eight) hours as needed for nausea or vomiting.   pantoprazole (PROTONIX) 40 MG tablet Take 1 tablet (40 mg total) by mouth daily before breakfast.   valACYclovir (VALTREX) 1000 MG tablet TAKE TWO TABLETS BY MOUTH TWICE DAILY FOR ONE DAY FEVER  BLISTER   morphine (MSIR) 15 MG tablet Take 1 tablet (15 mg total) by mouth every 6 (six) hours as needed for moderate pain or severe pain. Must last 30 days.   [DISCONTINUED] morphine (MSIR) 15 MG tablet Take 1 tablet (15 mg total) by mouth every 6 (six) hours as needed for moderate pain or severe pain. Must last 30 days. (Patient not taking: Reported on 01/03/2023)   [DISCONTINUED] naloxegol oxalate (MOVANTIK) 25 MG TABS tablet Take 1 tablet (25 mg total) by mouth daily.   [DISCONTINUED] phenazopyridine (PYRIDIUM) 100 MG tablet Take 1-2 tablets (100-200 mg total) by mouth 3 (three) times daily as needed for pain.   No facility-administered medications prior to visit.    ROS        Objective:     BP 126/79 (BP Location: Left Arm, Patient Position: Sitting, Cuff Size: Normal)   Pulse 67   Resp 16   Ht 5\' 3"  (1.6 m)   Wt 148 lb 9.6 oz (67.4 kg)   BMI 26.32 kg/m    Physical Exam Vitals reviewed.  Constitutional:      General: She is not in acute distress.    Appearance: Normal appearance. She is well-developed. She is not diaphoretic.  HENT:     Head: Normocephalic and atraumatic.     Right Ear: Tympanic membrane, ear canal and external ear normal.     Left Ear: Tympanic membrane, ear canal and external ear normal.     Nose: Nose normal.     Mouth/Throat:     Mouth: Mucous membranes are moist.     Pharynx: Oropharynx is clear. No oropharyngeal exudate.  Eyes:     General: No scleral icterus.    Conjunctiva/sclera: Conjunctivae normal.     Pupils: Pupils are equal, round, and reactive to light.  Neck:     Thyroid: No thyromegaly.  Cardiovascular:     Rate and Rhythm: Normal rate and regular rhythm.     Heart sounds: Normal heart sounds. No murmur heard. Pulmonary:     Effort: Pulmonary effort is normal. No respiratory distress.     Breath sounds: Normal breath sounds. No wheezing or rales.  Abdominal:     General: There is no distension.     Palpations: Abdomen is  soft.     Tenderness: There is no abdominal tenderness.  Musculoskeletal:        General: No deformity.     Cervical back: Neck supple.     Right lower leg: No edema.     Left lower leg: No edema.  Lymphadenopathy:     Cervical: No cervical adenopathy.  Skin:    General: Skin is warm and dry.     Findings: No rash.  Neurological:     Mental Status: She is alert and oriented to person, place, and  time. Mental status is at baseline.     Gait: Gait normal.  Psychiatric:        Mood and Affect: Mood normal.        Behavior: Behavior normal.        Thought Content: Thought content normal.      No results found for any visits on 02/20/23.     Assessment & Plan:    Routine Health Maintenance and Physical Exam  Immunization History  Administered Date(s) Administered   Influenza, Seasonal, Injecte, Preservative Fre 02/20/2023   Influenza,inj,Quad PF,6+ Mos 12/30/2015, 12/07/2016, 12/25/2017, 12/27/2018, 01/13/2020, 01/16/2021, 01/20/2022   PFIZER(Purple Top)SARS-COV-2 Vaccination 06/06/2019, 07/04/2019, 12/22/2019   PNEUMOCOCCAL CONJUGATE-20 02/20/2023   Pneumococcal Polysaccharide-23 12/30/2015   Tdap 02/27/2022    Health Maintenance  Topic Date Due   COVID-19 Vaccine (4 - 2023-24 season) 11/12/2022   Zoster Vaccines- Shingrix (1 of 2) 03/28/2023 (Originally 06/01/1978)   Medicare Annual Wellness (AWV)  09/20/2023   MAMMOGRAM  02/02/2024   Colonoscopy  01/16/2026   DTaP/Tdap/Td (2 - Td or Tdap) 02/28/2032   Pneumococcal Vaccine 58-64 Years old  Completed   INFLUENZA VACCINE  Completed   Hepatitis C Screening  Completed   HIV Screening  Completed   HPV VACCINES  Aged Out    Discussed health benefits of physical activity, and encouraged her to engage in regular exercise appropriate for her age and condition.  Problem List Items Addressed This Visit       Cardiovascular and Mediastinum   Hypertension   Longstanding persistent atrial fibrillation (HCC)     Other    Vitamin D deficiency   Relevant Orders   VITAMIN D 25 Hydroxy (Vit-D Deficiency, Fractures)   Pure hypercholesterolemia   Relevant Orders   Comprehensive metabolic panel   Lipid panel   Other Visit Diagnoses     Encounter for annual physical exam    -  Primary   Relevant Orders   Hemoglobin A1c   Comprehensive metabolic panel   Lipid panel   VITAMIN D 25 Hydroxy (Vit-D Deficiency, Fractures)   Immunization due       Relevant Orders   Pneumococcal conjugate vaccine 20-valent (Prevnar 20) (Completed)   Flu vaccine trivalent PF, 6mos and older(Flulaval,Afluria,Fluarix,Fluzone) (Completed)   Breast cancer screening by mammogram       Relevant Orders   MM 3D SCREENING MAMMOGRAM BILATERAL BREAST   Hyperglycemia       Relevant Orders   Hemoglobin A1c   Arthralgia of left temporomandibular joint               Degenerative Disc Disease Chronic back pain exacerbated by massages. Significant pain reported post-massage. Discussed risks of continued massages and alternative pain management strategies. - Provide a letter stating massages are not recommended due to increased pain from degenerative disc disease.  Temporomandibular Joint Disorder (TMJ) Left jaw pain exacerbated by chewing, likely related to bruxism. Symptoms include clicking and pain upon palpation. Discussed rest, avoidance of hard-to-chew foods, pain medications, and potential benefit of a dental appliance. - Recommend rest and avoidance of hard-to-chew foods. - Suggest use of Tylenol and other pain medications as needed. - Discuss the potential benefit of a dental appliance to prevent bruxismm but patient has tried previously.  General Health Maintenance Routine wellness visit. Discussed vaccinations, screenings, and general health maintenance. Informed consent provided regarding flu, pneumonia, and COVID vaccines, mammograms, and routine lab work including cholesterol, kidney and liver function, A1c, and vitamin D  levels. - Administer flu  and pneumonia vaccines. - Recommend getting the new COVID booster at a pharmacy. - Order mammogram and instruct to schedule it. - Order labs including cholesterol, kidney and liver function, A1c, and vitamin D levels.  Follow-up - Schedule next physical in one year. - Send MyChart message with A1c results and dietary recommendations based on results.        Return in about 1 year (around 02/20/2024) for CPE.     Shirlee Latch, MD

## 2023-02-20 NOTE — Patient Instructions (Signed)
Call [facility] to schedule a mammogram [phone]  

## 2023-02-21 LAB — COMPREHENSIVE METABOLIC PANEL
ALT: 9 [IU]/L (ref 0–32)
AST: 17 [IU]/L (ref 0–40)
Albumin: 4 g/dL (ref 3.9–4.9)
Alkaline Phosphatase: 145 [IU]/L — ABNORMAL HIGH (ref 44–121)
BUN/Creatinine Ratio: 8 — ABNORMAL LOW (ref 12–28)
BUN: 6 mg/dL — ABNORMAL LOW (ref 8–27)
Bilirubin Total: 0.4 mg/dL (ref 0.0–1.2)
CO2: 24 mmol/L (ref 20–29)
Calcium: 9.2 mg/dL (ref 8.7–10.3)
Chloride: 101 mmol/L (ref 96–106)
Creatinine, Ser: 0.71 mg/dL (ref 0.57–1.00)
Globulin, Total: 2.5 g/dL (ref 1.5–4.5)
Glucose: 97 mg/dL (ref 70–99)
Potassium: 4.2 mmol/L (ref 3.5–5.2)
Sodium: 138 mmol/L (ref 134–144)
Total Protein: 6.5 g/dL (ref 6.0–8.5)
eGFR: 95 mL/min/{1.73_m2} (ref >59)

## 2023-02-21 LAB — LIPID PANEL
Chol/HDL Ratio: 3.1 {ratio} (ref 0.0–4.4)
Cholesterol, Total: 174 mg/dL (ref 100–199)
HDL: 57 mg/dL (ref >39)
LDL Chol Calc (NIH): 90 mg/dL (ref 0–99)
Triglycerides: 157 mg/dL — ABNORMAL HIGH (ref 0–149)
VLDL Cholesterol Cal: 27 mg/dL (ref 5–40)

## 2023-02-21 LAB — HEMOGLOBIN A1C
Est. average glucose Bld gHb Est-mCnc: 111 mg/dL
Hgb A1c MFr Bld: 5.5 % (ref 4.8–5.6)

## 2023-02-21 LAB — VITAMIN D 25 HYDROXY (VIT D DEFICIENCY, FRACTURES): Vit D, 25-Hydroxy: 8.1 ng/mL — ABNORMAL LOW (ref 30.0–100.0)

## 2023-02-27 ENCOUNTER — Encounter: Payer: Self-pay | Admitting: Family Medicine

## 2023-02-27 ENCOUNTER — Encounter: Payer: Self-pay | Admitting: Internal Medicine

## 2023-02-28 ENCOUNTER — Ambulatory Visit: Payer: Medicare Other | Admitting: Cardiology

## 2023-03-01 ENCOUNTER — Encounter: Payer: Self-pay | Admitting: Cardiology

## 2023-03-01 ENCOUNTER — Ambulatory Visit: Payer: Medicare Other | Attending: Cardiology | Admitting: Cardiology

## 2023-03-01 VITALS — BP 115/68 | HR 67 | Ht 63.0 in | Wt 145.0 lb

## 2023-03-01 DIAGNOSIS — I4811 Longstanding persistent atrial fibrillation: Secondary | ICD-10-CM | POA: Diagnosis not present

## 2023-03-01 DIAGNOSIS — D6869 Other thrombophilia: Secondary | ICD-10-CM | POA: Diagnosis not present

## 2023-03-01 NOTE — Telephone Encounter (Signed)
Looks like they are still pending. Can we call the lab maybe?

## 2023-03-01 NOTE — Patient Instructions (Signed)
Medication Instructions:  No changes at this time.   *If you need a refill on your cardiac medications before your next appointment, please call your pharmacy*   Lab Work: None  If you have labs (blood work) drawn today and your tests are completely normal, you will receive your results only by: MyChart Message (if you have MyChart) OR A paper copy in the mail If you have any lab test that is abnormal or we need to change your treatment, we will call you to review the results.   Testing/Procedures: None   Follow-Up: At [facility], you and your health needs are our priority.  As part of our continuing mission to provide you with exceptional heart care, we have created designated Provider Care Teams.  These Care Teams include your primary Cardiologist (physician) and Advanced Practice Providers (APPs -  Physician Assistants and Nurse Practitioners) who all work together to provide you with the care you need, when you need it.   Your next appointment:   3 month(s)  Provider:   Steffanie Dunn, MD

## 2023-03-01 NOTE — Progress Notes (Signed)
Cardiology Clinic Note    Date:  03/01/2023  Patient ID:  Pippin, Burdine 1960/07/14, MRN [id number] PCP:  Erasmo Downer, MD  Cardiologist:  Debbe Odea, MD Electrophysiologist: Lanier Prude, MD     Patient Profile    Chief Complaint: AF ablation follow-up  History of Present Illness: [name] is a 62 y.o. female with PMH notable for persis AFib, HTN, OSA; seen today for Lanier Prude, MD for routine electrophysiology followup.  She is s/p AFib ablation 2022, 2023 both at [facility], and repeat AFib, atypical flutter ablation 11/2022 w Dr. Lalla Brothers.  She saw PA Nelva Bush 01/01/2023 for routine 1 mon post-procedure and was having paroxysms of afib. She had inadvertently stopped amiodarone, and this was not continued at appt.   On follow-up today, she is having palpitation episodes that last less than 30 minutes a couple times a week. This is a significant improvement than prior to ablation, where she was having days-long AF episodes. During current episodes, she has chest tightness, fatigue and SOB. She is concerned that the ablation was not successful as this was what she felt after her previous ablations where the brief episodes steadily increased in duration as she got further away from ablation.   She continues to take eliquis BID, no bleeding concerns. She is interested in tikosyn in the future, if recommended.  Currently,  she denies chest pain, palpitations, dyspnea, PND, orthopnea, nausea, vomiting, dizziness, syncope, edema, weight gain, or early satiety.      AAD History: Amiodarone - stopped d/t young age after 11/2022 ablation Flecainide - SE: headache, lower extremity edema    ROS:  Please see the history of present illness. All other systems are reviewed and otherwise negative.    Physical Exam    VS:  BP 115/68 (BP Location: Left Arm, Patient Position: Sitting, Cuff Size: Normal)   Pulse 67   Ht 5\' 3"  (1.6 m)   Wt 145 lb (65.8 kg)    SpO2 98%   BMI 25.69 kg/m  BMI: Body mass index is 25.69 kg/m.  Wt Readings from Last 3 Encounters:  03/01/23 145 lb (65.8 kg)  02/20/23 148 lb 9.6 oz (67.4 kg)  02/15/23 146 lb 6.4 oz (66.4 kg)     GEN- The patient is well appearing, alert and oriented x 3 today.   Lungs- Clear to ausculation bilaterally, normal work of breathing.  Heart- Regular rate and rhythm, no murmurs, rubs or gallops Extremities- No peripheral edema, warm, dry    Studies Reviewed   Previous EP, cardiology notes.    EKG is ordered. Personal review of EKG from today shows:    EKG Interpretation Date/Time:  Thursday March 01 2023 13:54:33 EST Ventricular Rate:  65 PR Interval:  124 QRS Duration:  86 QT Interval:  398 QTC Calculation: 413 R Axis:   13  Text Interpretation: Normal sinus rhythm Confirmed by Sherie Don [phone]) on 03/01/2023 1:59:20 PM    TTE, 07/31/2021  1. Left ventricular ejection fraction, by estimation, is 55 to 60%. Left ventricular ejection fraction by 2D MOD biplane is 55.3 %. The left ventricle has normal function. The left ventricle has no regional wall motion abnormalities. There is mild left  ventricular hypertrophy. Left ventricular diastolic parameters are indeterminate.   2. Right ventricular systolic function is normal. The right ventricular size is normal.   3. The mitral valve is normal in structure. Trivial mitral valve regurgitation.   4. The aortic valve  is tricuspid. Aortic valve regurgitation is trivial.      Assessment and Plan    #) persis AFib S/p AF ablation x 3, most recently 11/2022 Significant improvement in AFib burden since ablation though she is having recurrence of paroxysmal episodes Amiodarone stopped ~11/2022, continue to wash out Tikosyn appears to be an appropriate AAD option in future. Discussed that she would need to stop PRN zofran prior to initiating tikosyn   #) Hypercoag d/t persis afib CHA2DS2-VASc Score = at least 3 [CHF History:  0, HTN History: 1, Diabetes History: 0, Stroke History: 0, Vascular Disease History: 1, Age Score: 0, Gender Score: 1].  Therefore, the patient's annual risk of stroke is 3.2 %.    Stroke ppx - 5mg  eliquis BID, appropriately dosed No bleeding concerns  #) HTN Well controlled in office      Current medicines are reviewed at length with the patient today.   The patient does not have concerns regarding her medicines.  The following changes were made today:  none  Labs/ tests ordered today include:  Orders Placed This Encounter  Procedures   EKG 12-Lead   EKG 12-Lead     Disposition: Follow up with Dr. Lalla Brothers or EP APP in 3 months to re-discuss tikosyn   Signed, Sherie Don, NP  03/01/23  3:07 PM  Electrophysiology [facility]

## 2023-03-05 ENCOUNTER — Ambulatory Visit: Payer: Medicare Other | Admitting: Cardiology

## 2023-03-11 NOTE — Patient Instructions (Incomplete)

## 2023-03-11 NOTE — Progress Notes (Unsigned)
PROVIDER NOTE: Information contained herein reflects review and annotations entered in association with encounter. Interpretation of such information and data should be left to medically-trained personnel. Information provided to patient can be located elsewhere in the medical record under "Patient Instructions". Document created using STT-dictation technology, any transcriptional errors that may result from process are unintentional.    Patient: [name]  Service Category: E/M  Provider: Oswaldo Done, MD  DOB: 02-21-1961  DOS: 03/12/2023  Referring Provider: Erasmo Downer, MD  MRN: [id number]  Specialty: Interventional Pain Management  PCP: Shelby Downer, MD  Type: Established Patient  Setting: Ambulatory outpatient    Location: Office  Delivery: Face-to-face     HPI  [name], a 62 y.o. year old female, is here today because of her Chronic pain syndrome [G89.4]. [name] primary complain today is Neck Pain and Shoulder Pain (bilateral)  Pertinent problems: 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); Chronic hip pain (2ry area of Pain) (Bilateral) (L>R); Chronic shoulder arthropathy (Left); Trigger point of shoulder region (Left); Enthesopathy of hip region (Left); Groin  pain, chronic, left; Other intervertebral disc degeneration, lumbar region; History of lumbar surgery; DDD (degenerative disc disease), lumbosacral; Neuropathic pain; Neurogenic pain; Spondylosis without myelopathy or radiculopathy, lumbosacral region; Chronic musculoskeletal pain; Cervical radiculitis (C4,C5,C8) (Left); DDD (degenerative disc disease), cervical; Foraminal stenosis of cervical region (C3-4) (Right); 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); Abnormal MRI, shoulder (Left: 03/20/2017) (Right: 12/11/2020); Chronic neck pain with history of cervical spinal surgery; Enthesopathy of shoulder (Left); Osteoarthritis of shoulder (Left); Symptoms referable to shoulder joint; Tendinopathy of rotator cuff (Left); Sprain of supraspinatus muscle or tendon, sequela (Left); Subdeltoid bursitis of shoulder (Left); Subacromial bursitis of shoulder (Left); Chronic shoulder pain (Left); Abnormal MRI, cervical spine (05/12/2020); Chronic upper back pain; Chronic shoulder pain (Right); Osteoarthritis of shoulder (Right); Arthropathy of shoulder (Right); C7 radiculopathy (Right); Cervical radiculopathy at C8 (Right); Muscle weakness of upper extremity (Right); Weakness of upper extremity (Right); Cervical fusion syndrome; Failed back syndrome of cervical spine; Cervicalgia; Chronic hip pain after total replacement (THR) (Right); Fall (06/13/2022); and Acute left-sided low back pain without sciatica on their pertinent problem list. Pain Assessment: Severity of Chronic pain is reported as a 3 /10. Location: Neck Right, Left/denies. Onset: More than a month ago. Quality: Tightness, Aching. Timing: Constant. Modifying factor(s): meds. Vitals:  height is 5\' 3"  (1.6 m) and weight is 145 lb (65.8 kg). Her temperature is 97.7 F (36.5 C). Her blood pressure is 130/87 and her pulse is 107 (abnormal). Her respiration is 16 and oxygen saturation is 98%.  BMI: Estimated body mass index is 25.69 kg/m as calculated  from the following:   Height as of this encounter: 5\' 3"  (1.6 m).   Weight as of this encounter: 145 lb (65.8 kg). Last encounter: 12/13/2022. Last procedure: 06/06/2022.  Reason for encounter: medication management.  The patient indicates doing well with the current medication regimen. No  adverse reactions or side effects reported to the medications.   Discussed the use of AI scribe software for clinical note transcription with the patient, who gave verbal consent to proceed.  History of Present Illness   The patient, with a history of chronic pain managed with morphine, reports no adverse reactions to the medication. They describe their worst pain as being across both shoulders and the neck, with the left side being more severe. The patient notes that the pain is somewhat alleviated by the morphine, and they occasionally supplement with over-the-counter Tylenol or ibuprofen, despite being on blood thinners. They acknowledge the potential risks and take these additional medications sparingly, about once a month, when the pain is particularly severe.  The patient was recently started on daily Vitamin D supplementation by their primary care physician due to a noted deficiency. Since starting the Vitamin D, they report feeling less generalized achiness. They were also provided with a list of anti-inflammatory supplements to consider adding to their regimen, with the understanding that they should consult with their primary care physician and pharmacist to ensure no potential drug interactions.     RTCB: 06/16/2023   Pharmacotherapy Assessment  Analgesic: Morphine IR 15 mg 1 tab p.o. every 6 hours (60 mg/day of morphine) MME/day: 60 mg/day.   Monitoring: [place] PMP: PDMP reviewed during this encounter.       Pharmacotherapy: No side-effects or adverse reactions reported. Compliance: No problems identified. Effectiveness: Clinically acceptable.  Shelby Salts, RN  03/12/2023 11:17 AM  Sign when Signing  Visit Nursing Pain Medication Assessment:  Safety precautions to be maintained throughout the outpatient stay will include: orient to surroundings, keep bed in low position, maintain call bell within reach at all times, provide assistance with transfer out of bed and ambulation.  Medication Inspection Compliance: Pill count conducted under aseptic conditions, in front of the patient. Neither the pills nor the bottle was removed from the patient's sight at any time. Once count was completed pills were immediately returned to the patient in their original bottle.  Medication: Morphine IR Pill/Patch Count:  40 of 120 pills remain Pill/Patch Appearance: Markings consistent with prescribed medication Bottle Appearance: Standard pharmacy container. Clearly labeled. Filled Date: 46 / 07 / 2024 Last Medication intake:  Today    No results found for: "CBDTHCR" No results found for: "D8THCCBX" No results found for: "D9THCCBX"  UDS:  Summary  Date Value Ref Range Status  08/30/2022 Note  Final    Comment:    ==================================================================== [employer] Select 13 (MW) ==================================================================== Test                             Result       Flag       Units  Drug Present and Declared for Prescription Verification   Desmethyldiazepam              60           EXPECTED   ng/mg creat   Oxazepam                       222          EXPECTED   ng/mg creat   Temazepam                      117          EXPECTED   ng/mg creat  Desmethyldiazepam, oxazepam, and temazepam are expected metabolites    of diazepam. Desmethyldiazepam and oxazepam are also expected    metabolites of other drugs, including chlordiazepoxide, prazepam,    clorazepate, and halazepam. Oxazepam is an expected metabolite of    temazepam. Oxazepam and temazepam are also available as scheduled    prescription medications.    Alprazolam                     353           EXPECTED   ng/mg creat   Alpha-hydroxyalprazolam        778          EXPECTED   ng/mg creat    Source of alprazolam is a scheduled prescription medication. Alpha-    hydroxyalprazolam is an expected metabolite of alprazolam.    Morphine                       >[id number]       EXPECTED   ng/mg creat   Normorphine                    1386         EXPECTED   ng/mg creat    Potential sources of large amounts of morphine in the absence of    codeine include administration of morphine or use of heroin.     Normorphine is an expected metabolite of morphine.    Hydromorphone                  244          EXPECTED   ng/mg creat    Hydromorphone may be present as a metabolite of morphine;    concentrations of hydromorphone rarely exceed 5% of the morphine    concentration when this is the source of hydromorphone.  ==================================================================== Test                      Result    Flag   Units      Ref Range   Creatinine              77               mg/dL      >=96 ==================================================================== Declared Medications:  The flagging and interpretation on this report are based on the  following declared medications.  Unexpected results may arise from  inaccuracies in the declared medications.   **Note: The testing scope of this panel includes these medications:   Alprazolam (Xanax)  Diazepam (Valium)  Morphine (MSIR)   **Note: The testing scope of this panel does not include the  following reported medications:   Acetaminophen (Tylenol)  Apixaban (Eliquis)  Dexlansoprazole (Dexilant)  Diltiazem (Cardizem)  Estradiol (Estrace)  Flecainide (Tambocor)  Linaclotide (Linzess)  Montelukast (Singulair)  Naloxone (Narcan)  Valacyclovir (Valtrex) ==================================================================== For clinical consultation, please call [phone]. ====================================================================       ROS  Constitutional: Denies any fever or chills Gastrointestinal: No reported hemesis, hematochezia, vomiting, or acute GI distress Musculoskeletal: Denies any acute onset joint swelling, redness, loss of ROM, or weakness Neurological: No reported episodes of acute onset apraxia, aphasia, dysarthria, agnosia, amnesia, paralysis, loss of coordination, or loss of consciousness  Medication Review  ALPRAZolam, Mometasone Furoate, acetaminophen, apixaban, cholecalciferol, diazepam, diltiazem, linaclotide, montelukast, morphine, naloxone, ondansetron, pantoprazole, and valACYclovir  History Review  Allergy: [name] is allergic  to amoxicillin, chlorhexidine gluconate, clindamycin, codeine, erythromycin, penicillin g, sulfa antibiotics, levofloxacin, shellfish allergy, decadron [dexamethasone], flecainide, mangifera indica, papaya derivatives, betadine [povidone iodine], clarithromycin, other, povidone-iodine, and prednisone. Drug: [name]  reports current drug use. Alcohol:  reports no history of alcohol use. Tobacco:  reports that she has never smoked. She has never used smokeless tobacco. Social: Ms. Assink  reports that she has never smoked. She has never used smokeless tobacco. She reports current drug use. She reports that she does not drink alcohol. 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), Anemia, Anxiety, Asthma, Cephalalgia (08/18/2014), Cervical disc disease (08/18/2014), Chronic headaches, Chronic, continuous use of opioids, DDD (degenerative disc disease), Depression, Dissociative disorder, Dizziness (04/22/2013), Duodenal ulcer with hemorrhage and perforation (HCC) (04/27/2003), Foot drop, right, GERD (gastroesophageal reflux disease), H/O arthrodesis (08/18/2014), Headache(784.0),  History of blood transfusion, History of cardiac cath, History of cardioversion, History of cervical spinal surgery (01/04/2015), History of kidney stones, Hypertension, Inverted T wave, Iron deficiency (02/01/2021), Mitral regurgitation, Narrowing of intervertebral disc space (08/18/2014), Orthostatic hypotension (04/22/2013), Pain, Paroxysmal atrial fibrillation (HCC) (2021), Pneumonia, PONV (postoperative nausea and vomiting), Postop Hyponatremia (05/14/2012), Postoperative anemia due to acute blood loss (05/14/2012), PTSD (post-traumatic stress disorder), Right foot drop, Right hip arthralgia (08/18/2014), Sleep apnea (2021), Therapeutic opioid-induced constipation (OIC), and Typical atrial flutter (HCC). Surgical: [name]  has a past surgical history that includes RIGHT HIP ARTHROSCOPY FOR LABRAL TEAR (ABOUT 2010); Anterior fusion cervical spine (MAY 2012); Nasal septum surgery [facility] 2013); Back surgery (2009); Tonsillectomy; Abdominal hysterectomy; DIAGNOSTIC LAPAROSCOPIES - MULTIPLE FOR ENDOMETRIOSIS; Cholecystectomy; Hip arthroscopy (09/20/2011); Shoulder arthroscopy (05-06-12); Carpal tunnel release (05-06-12); Total hip arthroplasty (Right, 05/13/2012); Anterior cervical decomp/discectomy fusion (N/A, 10/30/2012); Posterior cervical fusion/foraminotomy (N/A, 04/16/2013); Breast reduction surgery (Bilateral, 06/2016); Colonoscopy with propofol (N/A, 01/26/2017); Esophagogastroduodenoscopy (egd) with propofol (N/A, 01/26/2017); Esophageal dilation (N/A, 01/26/2017); polypectomy (N/A, 01/26/2017); PH impedance study (N/A, 05/30/2017); Esophageal manometry (N/A, 05/30/2017); Radiofrequency ablation nerves; afib (05/2019); LEFT HEART CATH AND CORONARY ANGIOGRAPHY (Left, 06/10/2019); Cardioversion (N/A, 12/03/2019); Esophagogastroduodenoscopy (egd) with propofol (N/A, 01/30/2020); Reduction mammaplasty (Bilateral, 05/2016); Breast biopsy (Right, 2008); Breast excisional biopsy (Left, 1998); TEE without cardioversion  (N/A, 08/05/2020); ATRIAL FIBRILLATION ABLATION (N/A, 08/06/2020); Cardiac electrophysiology mapping and ablation; Esophagogastroduodenoscopy (egd) with propofol (N/A, 01/15/2021); Colonoscopy with propofol (N/A, 01/16/2021); Cardioversion (N/A, 08/01/2021); Cardioversion (N/A, 08/05/2021); Esophagogastroduodenoscopy (egd) with propofol (N/A, 12/07/2021); Lower Extremity Angiography (Left, 01/06/2022); and ATRIAL FIBRILLATION ABLATION (N/A, 12/04/2022). Family: family history includes Aneurysm in her maternal grandmother and mother; Anxiety disorder in her grandchild; Bipolar disorder in her grandchild and sister; Breast cancer (age of onset: 39) in her paternal grandmother; Depression in her grandchild.  Laboratory Chemistry Profile   Renal Lab Results  Component Value Date   BUN 6 (L) 02/20/2023   CREATININE 0.71 02/20/2023   BCR 8 (L) 02/20/2023   GFRAA >60 11/26/2019   GFRNONAA >60 12/04/2022    Hepatic Lab Results  Component Value Date   AST 17 02/20/2023   ALT 9 02/20/2023   ALBUMIN 4.0 02/20/2023   ALKPHOS 145 (H) 02/20/2023   ALKPHOS 146 (H) 02/20/2023   LIPASE 26 08/08/2021    Electrolytes Lab Results  Component Value Date   NA 138 02/20/2023   K 4.2 02/20/2023   CL 101 02/20/2023   CALCIUM 9.2 02/20/2023   MG 2.3 08/08/2021    Bone Lab Results  Component Value Date   VD25OH 8.1 (L) 02/20/2023   25OHVITD1 23 (  L) 11/11/2018   25OHVITD2 9.8 11/11/2018   25OHVITD3 13 11/11/2018    Inflammation (CRP: Acute Phase) (ESR: Chronic Phase) Lab Results  Component Value Date   CRP 12 (H) 11/11/2018   ESRSEDRATE 42 (H) 11/11/2018   LATICACIDVEN 1.0 01/14/2021         Note: Above Lab results reviewed.  Recent Imaging Review  VAS [place] GROIN PSEUDOANEURYSM  ARTERIAL PSEUDOANEURYSM    Patient Name:  [name]  Date of Exam:   02/15/2023 Medical Rec #: [id number]         Accession #:    [id number] Date of Birth: July 11, 1960         Patient Gender: F Patient Age:   57  years Exam Location:  [facility] Procedure:      VAS [place] Bobetta Lime Referring Phys: Levora Dredge  --------------------------------------------------------------------------------   Exam: Left groin Indications: Patient complains of Left foot and ankle edema. 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 Performing Technologist: Hardie Lora RVT    Examination Guidelines: A complete evaluation includes B-mode imaging, spectral Doppler, color Doppler, and power Doppler as needed of all accessible portions of each vessel. Bilateral testing is considered an integral part of a complete examination. Limited examinations for reoccurring indications may be performed as noted.  +-----------+----------+---------+------+----------+ Left DuplexPSV (cm/s)Waveform PlaqueComment(s) +-----------+----------+---------+------+----------+ CFA           100    triphasic                 +-----------+----------+---------+------+----------+ PFA            63    biphasic                  +-----------+----------+---------+------+----------+ Prox SFA       94    triphasic                 +-----------+----------+---------+------+----------+  Left Vein comments:   Summary: No evidence of pseudoaneurysm, AVF or DVT Region where previously documented coiled pseudoaneurysm no longer appears to have a well defined structure. Diffuse hematoma noted with no active bleeding.  Diagnosing physician: Levora Dredge MD Electronically signed by Levora Dredge MD on 02/20/2023 at 9:35:34 AM.         --------------------------------------------------------------------------------      Final   Note: Reviewed        Physical Exam  General appearance: Well nourished, well developed, and well hydrated. In no apparent acute distress Mental status: Alert, oriented x 3 (person, place, & time)       Respiratory: No evidence of acute  respiratory distress Eyes: PERLA Vitals: BP 130/87   Pulse (!) 107   Temp 97.7 F (36.5 C)   Resp 16   Ht 5\' 3"  (1.6 m)   Wt 145 lb (65.8 kg)   SpO2 98%   BMI 25.69 kg/m  BMI: Estimated body mass index is 25.69 kg/m as calculated from the following:   Height as of this encounter: 5\' 3"  (1.6 m).   Weight as of this encounter: 145 lb (65.8 kg). Ideal: Ideal body weight: 52.4 kg (115 lb 8.3 oz) Adjusted ideal body weight: 57.7 kg (127 lb 5 oz)  Assessment   Diagnosis Status  1. Chronic pain syndrome   2. Chronic low back pain (1ry area of Pain) (Bilateral) (L>R) w/o sciatica   3. Chronic hip pain (2ry area of Pain) (Bilateral) (L>R)   4. Chronic shoulder pain (3ry area of Pain) (Bilateral) (L>R)  5. Chronic neck pain with history of cervical spinal surgery   6. Cervicalgia   7. Chronic upper back pain   8. Lumbar facet syndrome (Bilateral) (L>R)   9. Failed back syndrome of cervical spine   10. 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 Shon Cooley)   11. Pharmacologic therapy   12. Chronic use of opiate for therapeutic purpose   13. Encounter for medication management   14. Encounter for chronic pain management    Controlled Controlled Controlled   Updated Problems: No problems updated.  Plan of Care  Problem-specific:  Assessment and Plan    Chronic Pain They experience chronic pain primarily across the shoulders and neck, with the left side being more affected. Their pain management includes morphine and acetaminophen, with occasional ibuprofen use despite anticoagulation therapy. They have been advised against ibuprofen due to anticoagulation but have received guidance on minimizing risks if taken, including the use of omeprazole and taking it with food. The lack of anti-inflammatory properties in morphine and acetaminophen was discussed, along with the need for gastroprotection if ibuprofen is used. They have been provided a list of anti-inflammatory supplements to  consider, with emphasis on consulting their primary care physician and pharmacist. The plan is to continue the current pain management regimen, avoid ibuprofen unless necessary, and consider anti-inflammatory supplements. A 90-day supply of medication will be sent to the pharmacy, and a follow-up appointment is scheduled in 90 days. Injections are offered for flare-ups if needed.  Vitamin D Deficiency Their vitamin D deficiency is managed by their primary care physician, with noted improvement in generalized achiness since starting vitamin D supplementation. The role of vitamin D in reducing pain sensitivity and its importance in overall pain management was discussed. They will continue daily vitamin D supplementation as prescribed by their primary care physician.  General Health Maintenance The importance of vitamin D for pain management and the potential benefits of supplements like glucosamine chondroitin and turmeric/curcumin for long-term pain relief were discussed. They have been advised to consult with their primary care physician and pharmacist before starting new supplements and provided a list of recommended supplements for anti-inflammatory and joint health.  Follow-up A follow-up appointment is scheduled in 90 days. They are instructed to contact the clinic for injections if experiencing flare-ups.       [name] has a current medication list which includes the following long-term medication(s): diltiazem, eliquis, linaclotide, mometasone furoate, montelukast, [START ON 03/18/2023] morphine, [START ON 04/17/2023] morphine, [START ON 05/17/2023] morphine, and pantoprazole.  Pharmacotherapy (Medications Ordered): Meds ordered this encounter  Medications   morphine (MSIR) 15 MG tablet    Sig: Take 1 tablet (15 mg total) by mouth every 6 (six) hours as needed for moderate pain (pain score 4-6) or severe pain (pain score 7-10). Must last 30 days.    Dispense:  30 tablet    Refill:   0    DO NOT: delete (not duplicate); no partial-fill (will deny script to complete), no refill request (F/U required). DISPENSE: 1 day early if closed on fill date. WARN: No CNS-depressants within 8 hrs of med.   morphine (MSIR) 15 MG tablet    Sig: Take 1 tablet (15 mg total) by mouth every 6 (six) hours as needed for moderate pain (pain score 4-6) or severe pain (pain score 7-10). Must last 30 days.    Dispense:  30 tablet    Refill:  0    DO NOT: delete (not duplicate); no partial-fill (will  deny script to complete), no refill request (F/U required). DISPENSE: 1 day early if closed on fill date. WARN: No CNS-depressants within 8 hrs of med.   morphine (MSIR) 15 MG tablet    Sig: Take 1 tablet (15 mg total) by mouth every 6 (six) hours as needed for moderate pain (pain score 4-6) or severe pain (pain score 7-10). Must last 30 days.    Dispense:  30 tablet    Refill:  0    DO NOT: delete (not duplicate); no partial-fill (will deny script to complete), no refill request (F/U required). DISPENSE: 1 day early if closed on fill date. WARN: No CNS-depressants within 8 hrs of med.   Orders:  No orders of the defined types were placed in this encounter.  Follow-up plan:   Return in about 3 months (around 06/16/2023) for Eval-day (M,W), (F2F), (MM).      Interventional Therapies  Risk Factors  Considerations:   ELIQUIS ANTICOAGULATION: (Stop: 3 days  Restart: 6 hours) ALLERGY: CONTRAST; shellfish; STEROIDS (dexamethasone, prednisone); Iodine HARDWARE: Not a candidate for RFA or SCS (cervical or lumbar). Patient not interested in IT pump (due to surgical risks). Concomitant BENZO therapy.  (At risk of respiratory depression) A-fib/flutter; unstable angina; CAD; (+) Cardiac cath; (+) cardioversion; mitral regurgitation Multiple allergies; OSA; GERD; IBS; OIC Depression; dissociative disorder; PTSD        Planned  Pending:   Left shoulder MRI and PT ordered today (08/30/2022)   Under  consideration:   Diagnostic right suprascapular NB #1 (without steroids)  Possible left cervical ESI #1 (With steroids, if suprascapular nerve RFA does not eliminate all of the pain in the left shoulder.)   Completed (No Steroids):   Diagnostic bilateral lumbar facet MBB x2 (No Steroids)) (01/14/2019) (100/100/100/0)  Therapeutic bilateral suprascapular NB x2 (No Steroids) (06/19/2016) (80/80/0)  Therapeutic left suprascapular nerve RFA x3 (05/13/2020) (100/100/75/0)    Therapeutic  Palliative (PRN) options:   Diagnostic bilateral lumbar facet MBB #2  Palliative bilateral suprascapular NB #3 (No Steroids) Palliative left suprascapular nerve RFA #2    Pharmacotherapy  Failed trial of membrane stabilizers (Neurontin/Lyrica).       Recent Visits Date Type Provider Dept  12/13/22 Office Visit Delano Metz, MD [facility]  Showing recent visits within past 90 days and meeting all other requirements Today's Visits Date Type Provider Dept  03/12/23 Office Visit Delano Metz, MD [facility]  Showing today's visits and meeting all other requirements Future Appointments No visits were found meeting these conditions. Showing future appointments within next 90 days and meeting all other requirements  I discussed the assessment and treatment plan with the patient. The patient was provided an opportunity to ask questions and all were answered. The patient agreed with the plan and demonstrated an understanding of the instructions.  Patient advised to call back or seek an in-person evaluation if the symptoms or condition worsens.  Duration of encounter: 30 minutes.  Total time on encounter, as per AMA guidelines included both the face-to-face and non-face-to-face time personally spent by the physician and/or other qualified health care professional(s) on the day of the encounter (includes time in activities that require the physician or other qualified health care  professional and does not include time in activities normally performed by clinical staff). Physician's time may include the following activities when performed: Preparing to see the patient (e.g., pre-charting review of records, searching for previously ordered imaging, lab work, and nerve conduction tests) Review of prior analgesic pharmacotherapies.  Reviewing PMP Interpreting ordered tests (e.g., lab work, imaging, nerve conduction tests) Performing post-procedure evaluations, including interpretation of diagnostic procedures Obtaining and/or reviewing separately obtained history Performing a medically appropriate examination and/or evaluation Counseling and educating the patient/family/caregiver Ordering medications, tests, or procedures Referring and communicating with other health care professionals (when not separately reported) Documenting clinical information in the electronic or other health record Independently interpreting results (not separately reported) and communicating results to the patient/ family/caregiver Care coordination (not separately reported)  Note by: Shelby Done, MD Date: 03/12/2023; Time: 12:51 PM

## 2023-03-12 ENCOUNTER — Ambulatory Visit: Payer: Medicare Other | Attending: Pain Medicine | Admitting: Pain Medicine

## 2023-03-12 ENCOUNTER — Ambulatory Visit: Payer: Self-pay | Admitting: *Deleted

## 2023-03-12 ENCOUNTER — Encounter: Payer: Self-pay | Admitting: Pain Medicine

## 2023-03-12 ENCOUNTER — Telehealth: Payer: Self-pay

## 2023-03-12 VITALS — BP 130/87 | HR 107 | Temp 97.7°F | Resp 16 | Ht 63.0 in | Wt 145.0 lb

## 2023-03-12 DIAGNOSIS — M47816 Spondylosis without myelopathy or radiculopathy, lumbar region: Secondary | ICD-10-CM

## 2023-03-12 DIAGNOSIS — M25512 Pain in left shoulder: Secondary | ICD-10-CM | POA: Diagnosis not present

## 2023-03-12 DIAGNOSIS — M545 Low back pain, unspecified: Secondary | ICD-10-CM | POA: Diagnosis not present

## 2023-03-12 DIAGNOSIS — M25552 Pain in left hip: Secondary | ICD-10-CM | POA: Insufficient documentation

## 2023-03-12 DIAGNOSIS — Z79899 Other long term (current) drug therapy: Secondary | ICD-10-CM | POA: Diagnosis not present

## 2023-03-12 DIAGNOSIS — M549 Dorsalgia, unspecified: Secondary | ICD-10-CM | POA: Diagnosis not present

## 2023-03-12 DIAGNOSIS — M961 Postlaminectomy syndrome, not elsewhere classified: Secondary | ICD-10-CM

## 2023-03-12 DIAGNOSIS — G894 Chronic pain syndrome: Secondary | ICD-10-CM

## 2023-03-12 DIAGNOSIS — G8928 Other chronic postprocedural pain: Secondary | ICD-10-CM

## 2023-03-12 DIAGNOSIS — M542 Cervicalgia: Secondary | ICD-10-CM

## 2023-03-12 DIAGNOSIS — Z9889 Other specified postprocedural states: Secondary | ICD-10-CM | POA: Diagnosis not present

## 2023-03-12 DIAGNOSIS — M25511 Pain in right shoulder: Secondary | ICD-10-CM | POA: Diagnosis not present

## 2023-03-12 DIAGNOSIS — Z79891 Long term (current) use of opiate analgesic: Secondary | ICD-10-CM | POA: Diagnosis not present

## 2023-03-12 DIAGNOSIS — M25551 Pain in right hip: Secondary | ICD-10-CM | POA: Insufficient documentation

## 2023-03-12 DIAGNOSIS — G8929 Other chronic pain: Secondary | ICD-10-CM | POA: Diagnosis not present

## 2023-03-12 MED ORDER — MORPHINE SULFATE 15 MG PO TABS: 15.0000 mg | ORAL_TABLET | Freq: Four times a day (QID) | ORAL | 0 refills | Status: DC | PRN

## 2023-03-12 NOTE — Telephone Encounter (Signed)
Opened chart to answer a question for agent.   Referred to practice due to a lab code being added to a former test sent to the lab.   Not familiar with the code or what it's for.

## 2023-03-12 NOTE — Progress Notes (Signed)
Nursing Pain Medication Assessment:  Safety precautions to be maintained throughout the outpatient stay will include: orient to surroundings, keep bed in low position, maintain call bell within reach at all times, provide assistance with transfer out of bed and ambulation.  Medication Inspection Compliance: Pill count conducted under aseptic conditions, in front of the patient. Neither the pills nor the bottle was removed from the patient's sight at any time. Once count was completed pills were immediately returned to the patient in their original bottle.  Medication: Morphine IR Pill/Patch Count:  40 of 120 pills remain Pill/Patch Appearance: Markings consistent with prescribed medication Bottle Appearance: Standard pharmacy container. Clearly labeled. Filled Date: 73 / 07 / 2024 Last Medication intake:  Today

## 2023-03-13 ENCOUNTER — Telehealth: Payer: Self-pay

## 2023-03-13 NOTE — Telephone Encounter (Signed)
 Pt given lab results per notes of Dr. Myrla on 03/13/23. Pt verbalized understanding.   Myrla Jon HERO, MD to [facility] Nurse     03/13/23 11:54 AM Note Alk phos fractionation shows normal distribution and mild elevation. No further testing needed at this time.

## 2023-03-13 NOTE — Telephone Encounter (Signed)
Macilbox full. Unable to leave voicemail. CRM created. Ok for [facility] to advise if patient returns call

## 2023-03-13 NOTE — Telephone Encounter (Signed)
 Alk phos fractionation shows normal distribution and mild elevation. No further testing needed at this time.

## 2023-03-16 LAB — ALKALINE PHOSPHATASE, ISOENZYMES
Alkaline Phosphatase: 146 [IU]/L — ABNORMAL HIGH (ref 44–121)
BONE FRACTION: 47 % (ref 14–68)
INTESTINAL FRAC.: 0 % (ref 0–18)
LIVER FRACTION: 53 % (ref 18–85)

## 2023-03-16 LAB — SPECIMEN STATUS REPORT

## 2023-03-21 ENCOUNTER — Other Ambulatory Visit: Payer: Self-pay | Admitting: Pain Medicine

## 2023-03-21 ENCOUNTER — Encounter: Payer: Self-pay | Admitting: Pain Medicine

## 2023-03-21 ENCOUNTER — Telehealth: Payer: Self-pay | Admitting: Pain Medicine

## 2023-03-21 ENCOUNTER — Other Ambulatory Visit: Payer: Self-pay

## 2023-03-21 DIAGNOSIS — M47816 Spondylosis without myelopathy or radiculopathy, lumbar region: Secondary | ICD-10-CM

## 2023-03-21 DIAGNOSIS — Z79899 Other long term (current) drug therapy: Secondary | ICD-10-CM

## 2023-03-21 DIAGNOSIS — M545 Low back pain, unspecified: Secondary | ICD-10-CM

## 2023-03-21 DIAGNOSIS — G8928 Other chronic postprocedural pain: Secondary | ICD-10-CM

## 2023-03-21 DIAGNOSIS — M961 Postlaminectomy syndrome, not elsewhere classified: Secondary | ICD-10-CM

## 2023-03-21 DIAGNOSIS — G894 Chronic pain syndrome: Secondary | ICD-10-CM

## 2023-03-21 DIAGNOSIS — M542 Cervicalgia: Secondary | ICD-10-CM

## 2023-03-21 DIAGNOSIS — G8929 Other chronic pain: Secondary | ICD-10-CM

## 2023-03-21 DIAGNOSIS — Z79891 Long term (current) use of opiate analgesic: Secondary | ICD-10-CM

## 2023-03-21 MED ORDER — MORPHINE SULFATE 15 MG PO TABS
15.0000 mg | ORAL_TABLET | Freq: Four times a day (QID) | ORAL | 0 refills | Status: DC | PRN
Start: 2023-04-20 — End: 2023-05-08

## 2023-03-21 MED ORDER — MORPHINE SULFATE 15 MG PO TABS: 15.0000 mg | ORAL_TABLET | Freq: Four times a day (QID) | ORAL | 0 refills | Status: DC | PRN

## 2023-03-21 NOTE — Telephone Encounter (Signed)
 PT called stated that Dr. Laban Emperor send in the wrong pill count for morphine. PT stated that the pharmacy had send over a request. PT stated that she is almost out. Please give patient a call. TY

## 2023-03-21 NOTE — Telephone Encounter (Signed)
 Message has been sent to Dr Laban Emperor. Awaiting response.

## 2023-03-22 ENCOUNTER — Telehealth: Payer: Self-pay

## 2023-03-22 NOTE — Telephone Encounter (Signed)
 Spoke with the pharmacist at [facility] and she she states that the 3 scripts for Morphine 15 mg #30 written on 03-12-23 had already been cancelled.

## 2023-04-03 ENCOUNTER — Encounter: Payer: Self-pay | Admitting: Cardiology

## 2023-04-03 ENCOUNTER — Ambulatory Visit: Payer: Medicare Other

## 2023-04-03 NOTE — Telephone Encounter (Signed)
Called and spoke with patient. Patient with complaint of intermittent burning in her chest with shortness of breath. Patient states that the symptoms started about 2 weeks ago. She said the first time she had the symptoms her blood pressure was 162/106 and heart rate in the 70's. Patient reports that the symptoms last about 30 - 60 minutes. Patient states that it has happened several days over the past 2 weeks but does not happen everyday. Patient denies symptoms at this time. Patient scheduled to be seen in clinic 04/06/23. Pt made aware of ED precaution should any new symptoms develop or worsen.

## 2023-04-05 NOTE — Progress Notes (Signed)
Cardiology Office Note:    Date:  04/06/2023   ID:  [name], DOB [birth year], MRN [id number]  PCP:  Erasmo Downer, MD  [facility] Cardiologist:  Debbe Odea, MD  [facility] Electrophysiologist:  Lanier Prude, MD   Referring MD: Erasmo Downer, MD   Chief Complaint: BP and afib issues  History of Present Illness:    [name] is a 63 y.o. female with a hx of persistent Afib, HTN, OSA who presents for follow-up.   The patient follows with EP for Afib. She underwent ablation 2022 and 2023 both at [facility] and repeat atypical aflutter ablation 11/2022 with Dr. Lalla Brothers. Amiodarone was stopped 11/2022.  The patient was last seen 02/2023 and had improvement of Afib burden.   Today, the patient reports worsening afib episodes. She has been feeling more afib episodes. She gets tired and has chest pain and shortness of breath during episodes. She is also seeing fluctuations in BP. Pulse has been 107 to 90s. She has dependent lower leg edema on her ankles, L>R. EKG shows NSR with PACs.  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    Chronic headaches    Chronic, continuous use of opioids    DDD (degenerative disc disease)    CERVICAL AND LUMBAR-CHRONIC PAIN, RT HIP LABRAL TEAR   Depression    PT STATES A LOT OF STRESS IN HER LIFE   Dissociative disorder    Dizziness 04/22/2013   Duodenal ulcer with hemorrhage and perforation (HCC) 04/27/2003   Foot drop, right    FROM BACK SURGERY   GERD (gastroesophageal reflux disease)    H/O arthrodesis 08/18/2014   Headache(784.0)    AND NECK  PAIN--STATES RECENT TEST SHOW CERVICAL DEGENERATION   History of blood transfusion    s/p back surgery   History of cardiac cath    a. 05/2019 Cath: Nl cors. EF 55-65%.   History of cardioversion    History of cervical spinal surgery 01/04/2015   History of kidney stones    Hypertension    Inverted T wave    Iron deficiency 02/01/2021   Mitral regurgitation    a. 07/2020 Echo: EF 60-65%, no rwma. Nl RV size/fxn. RVSP 39.11mmHg. Mildly to mod dil LA. Mod MR; b. 07/2020 TEE: EF 55-60%. Lambl's excresence. Nl RV size/fxn. Midly dil LA. No LA/LAA thrombus. Mild MR.   Narrowing of intervertebral disc space 08/18/2014   Currently on disability.    Orthostatic hypotension 04/22/2013   Pain    CHRONIC NECK AND BACK PAIN - LIMITED ROM NECK - S/P FUSIONS CERVICAL AND LUMBAR   Paroxysmal atrial fibrillation (HCC) 2021   a. 07/2020 s/p PVI.   Pneumonia    PONV (postoperative nausea and vomiting)    PT GIVES HX OF N&V AND FEVER WITH SURGERIES YEARS AGO--BUT NO PROBLEMS WITH MORE RECENT SURGERIES--STATES NOT MALIGNANT HYPERTHERMIA   Postop Hyponatremia 05/14/2012   Postoperative anemia due to acute blood loss 05/14/2012   PTSD (post-traumatic stress disorder)    Right foot drop  Right hip arthralgia 08/18/2014   Status post surgery of right, and now needs left.    Sleep apnea 2021   does not have a cpap   Therapeutic opioid-induced constipation (OIC)    Typical atrial flutter [facility])     Past Surgical History:  Procedure Laterality Date   ABDOMINAL HYSTERECTOMY     afib  05/2019   ANTERIOR CERVICAL DECOMP/DISCECTOMY FUSION N/A 10/30/2012   Procedure: ACDF C5-6, EXPLORATION AND HARDWARE REMOVAL C6-7;  Surgeon: Venita Lick, MD;  Location: [facility] OR;  Service: Orthopedics;  Laterality: N/A;   ANTERIOR FUSION CERVICAL SPINE  MAY 2012   AT [facility]   ATRIAL FIBRILLATION ABLATION N/A 08/06/2020   Procedure: ATRIAL FIBRILLATION ABLATION;  Surgeon: Lanier Prude, MD;  Location: [facility] INVASIVE CV LAB;   Service: Cardiovascular;  Laterality: N/A;   ATRIAL FIBRILLATION ABLATION N/A 12/04/2022   Procedure: ATRIAL FIBRILLATION ABLATION;  Surgeon: Lanier Prude, MD;  Location: [facility] INVASIVE CV LAB;  Service: Cardiovascular;  Laterality: N/A;   BACK SURGERY  2009   LUMBAR FUSION WITH RODS    BREAST BIOPSY Right 2008   benign.- bx/clip   BREAST EXCISIONAL BIOPSY Left 1998   benign   BREAST REDUCTION SURGERY Bilateral 06/2016   CARDIAC ELECTROPHYSIOLOGY MAPPING AND ABLATION     CARDIOVERSION N/A 12/03/2019   Procedure: CARDIOVERSION;  Surgeon: Debbe Odea, MD;  Location: [facility] ORS;  Service: Cardiovascular;  Laterality: N/A;   CARDIOVERSION N/A 08/01/2021   Procedure: CARDIOVERSION;  Surgeon: Debbe Odea, MD;  Location: [facility] ORS;  Service: Cardiovascular;  Laterality: N/A;   CARDIOVERSION N/A 08/05/2021   Procedure: CARDIOVERSION;  Surgeon: Antonieta Iba, MD;  Location: [facility] ORS;  Service: Cardiovascular;  Laterality: N/A;   CARPAL TUNNEL RELEASE  05-06-12   Right   CHOLECYSTECTOMY     COLONOSCOPY WITH PROPOFOL N/A 01/26/2017   Procedure: COLONOSCOPY WITH PROPOFOL;  Surgeon: Midge Minium, MD;  Location: [facility];  Service: Endoscopy;  Laterality: N/A;   COLONOSCOPY WITH PROPOFOL N/A 01/16/2021   Procedure: COLONOSCOPY WITH PROPOFOL;  Surgeon: Toney Reil, MD;  Location: [facility] ENDOSCOPY;  Service: Gastroenterology;  Laterality: N/A;   DIAGNOSTIC LAPAROSCOPIES - MULTIPLE FOR ENDOMETRIOSIS     ESOPHAGEAL DILATION N/A 01/26/2017   Procedure: ESOPHAGEAL DILATION;  Surgeon: Midge Minium, MD;  Location: [facility];  Service: Endoscopy;  Laterality: N/A;   ESOPHAGEAL MANOMETRY N/A 05/30/2017   Procedure: ESOPHAGEAL MANOMETRY (EM);  Surgeon: Midge Minium, MD;  Location: [facility] ENDOSCOPY;  Service: Endoscopy;  Laterality: N/A;   ESOPHAGOGASTRODUODENOSCOPY (EGD) WITH PROPOFOL N/A 01/26/2017   Procedure: ESOPHAGOGASTRODUODENOSCOPY (EGD) WITH PROPOFOL;  Surgeon: Midge Minium, MD;  Location: [facility];  Service: Endoscopy;  Laterality: N/A;   ESOPHAGOGASTRODUODENOSCOPY (EGD) WITH PROPOFOL N/A 01/30/2020   Procedure: ESOPHAGOGASTRODUODENOSCOPY (EGD) WITH PROPOFOL;  Surgeon: Midge Minium, MD;  Location: [facility];  Service: Endoscopy;  Laterality: N/A;  sleep apnea COVID + 01-15-20   ESOPHAGOGASTRODUODENOSCOPY (EGD) WITH PROPOFOL N/A 01/15/2021   Procedure: ESOPHAGOGASTRODUODENOSCOPY (EGD) WITH PROPOFOL;  Surgeon: Toney Reil, MD;  Location: [facility] ENDOSCOPY;  Service: Gastroenterology;  Laterality: N/A;   ESOPHAGOGASTRODUODENOSCOPY (EGD) WITH PROPOFOL N/A 12/07/2021   Procedure: ESOPHAGOGASTRODUODENOSCOPY (EGD) WITH PROPOFOL;  Surgeon: Toney Reil, MD;  Location: [facility] ENDOSCOPY;  Service: Gastroenterology;  Laterality: N/A;   HIP ARTHROSCOPY  09/20/2011   Procedure: ARTHROSCOPY HIP;  Surgeon: Loanne Drilling, MD;  Location: [facility] ORS;  Service: Orthopedics;  Laterality: Right;  Right Hip Scope with Labral Debridement   LEFT HEART  CATH AND CORONARY ANGIOGRAPHY Left 06/10/2019   Procedure: LEFT HEART CATH AND CORONARY ANGIOGRAPHY;  Surgeon: Yvonne Kendall, MD;  Location: [facility] INVASIVE CV LAB;  Service: Cardiovascular;  Laterality: Left;   LOWER EXTREMITY ANGIOGRAPHY Left 01/06/2022   Procedure: Lower Extremity Angiography;  Surgeon: Renford Dills, MD;  Location: [facility] INVASIVE CV LAB;  Service: Cardiovascular;  Laterality: Left;   NASAL SEPTUM SURGERY  MARCH 2013   IN [place]   [facility] IMPEDANCE STUDY N/A 05/30/2017   Procedure: PH IMPEDANCE STUDY;  Surgeon: Midge Minium, MD;  Location: [facility] ENDOSCOPY;  Service: Endoscopy;  Laterality: N/A;   POLYPECTOMY N/A 01/26/2017   Procedure: POLYPECTOMY;  Surgeon: Midge Minium, MD;  Location: [facility];  Service: Endoscopy;  Laterality: N/A;   POSTERIOR CERVICAL FUSION/FORAMINOTOMY N/A 04/16/2013   Procedure: REMOVAL CERVICAL PLATES AND INTERBODY CAGE/POSTERIOR CERVICAL SPINAL FUSION  C4 - C6/C5 CORPECTOMY/C4 - C6 FUSION WITH ILIAC CREST BONE GRAFT;  Surgeon: Venita Lick, MD;  Location: [facility] OR;  Service: Orthopedics;  Laterality: N/A;   RADIOFREQUENCY ABLATION NERVES     REDUCTION MAMMAPLASTY Bilateral 05/2016   RIGHT HIP ARTHROSCOPY FOR LABRAL TEAR  ABOUT 2010   2012 also   SHOULDER ARTHROSCOPY  05-06-12   bone spur   TEE WITHOUT CARDIOVERSION N/A 08/05/2020   Procedure: TRANSESOPHAGEAL ECHOCARDIOGRAM (TEE);  Surgeon: Lewayne Bunting, MD;  Location: [facility] ENDOSCOPY;  Service: Cardiovascular;  Laterality: N/A;   TONSILLECTOMY     AS A CHILD   TOTAL HIP ARTHROPLASTY Right 05/13/2012   Procedure: TOTAL HIP ARTHROPLASTY ANTERIOR APPROACH;  Surgeon: Loanne Drilling, MD;  Location: [facility] ORS;  Service: Orthopedics;  Laterality: Right;    Current Medications: Current Meds  Medication Sig   [DISCONTINUED] diltiazem (CARDIZEM) 30 MG tablet Take 1 tablet (30 mg total) by mouth as needed (elevated bp or hr above 120).     Allergies:   Amoxicillin, Chlorhexidine gluconate, Clindamycin, Codeine, Erythromycin, Penicillin g, Sulfa antibiotics, Levofloxacin, Shellfish allergy, Decadron [dexamethasone], Flecainide, Mangifera indica, Papaya derivatives, Betadine [povidone iodine], Clarithromycin, Other, Povidone-iodine, and Prednisone   Social History   Socioeconomic History   Marital status: Married    Spouse name: bruce   Number of children: 2   Years of education: Not on file   Highest education level: Associate degree: occupational, Scientist, product/process development, or vocational program  Occupational History    Comment: disabled  Tobacco Use   Smoking status: Never   Smokeless tobacco: Never   Tobacco comments:    Never smoked 01/01/23  Vaping Use   Vaping status: Never Used  Substance and Sexual Activity   Alcohol use: No    Alcohol/week: 0.0 standard drinks of alcohol   Drug use: Yes    Comment: prescribed morphine and xanax   Sexual activity: Not Currently  Other Topics Concern   Not on  file  Social History Narrative   Son, daughter  And 4 grandkids; raises 3 of the grandchildren      Lives in [place]; with husband; never smoked; no alcohol; 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    Social Drivers of Corporate investment banker Strain: Low Risk  (09/20/2022)   Overall Financial Resource Strain (CARDIA)    Difficulty of Paying Living Expenses: Not hard at all  Food Insecurity: No Food Insecurity (09/20/2022)   Hunger Vital Sign    Worried About Running Out of Food in the Last Year: Never true    Ran Out of Food in the Last Year: Never true  Transportation Needs: No  Transportation Needs (09/20/2022)   PRAPARE - Administrator, Civil Service (Medical): No    Lack of Transportation (Non-Medical): No  Physical Activity: Insufficiently Active (09/20/2022)   Exercise Vital Sign    Days of Exercise per Week: 2 days    Minutes of Exercise per Session: 20 min  Stress: Stress Concern Present (09/20/2022)   [employer] of Occupational Health - Occupational Stress Questionnaire    Feeling of Stress : Rather much  Social Connections: Moderately Integrated (09/20/2022)   Social Connection and Isolation Panel [NHANES]    Frequency of Communication with Friends and Family: More than three times a week    Frequency of Social Gatherings with Friends and Family: Twice a week    Attends Religious Services: More than 4 times per year    Active Member of [employer] or Organizations: No    Attends Banker Meetings: Never    Marital Status: Married     Family History: The patient's family history includes Aneurysm in her maternal grandmother and mother; Anxiety disorder in her grandchild; Bipolar disorder in her grandchild and sister; Breast cancer (age of onset: 34) in her paternal grandmother; Depression in her grandchild. There is no history of Cancer.  ROS:   Please see the history of present illness.     All other systems reviewed and are negative.  EKGs/Labs/Other  Studies Reviewed:    The following studies were reviewed today:  CT score 12/2022 IMPRESSION: 1. There is normal pulmonary vein drainage into the left atrium. (2 on the right and 2 on the left).   2. The left atrial appendage is a Windsock-cactus type with two lobes and ostial size 25 x 19 mm and length 29 mm, Area 34 mm2. There is no thrombus in the left atrial appendage.   3. The esophagus runs in the left atrial midline and is not in the proximity to any of the pulmonary veins.   4.  Coronary calcium score of 0  Heart monitor 07/2022 Patch Wear Time:  13 days and 21 hours (2024-04-22T20:32:17-0400 to 2024-05-06T17:57:42-0400)   Patient had a min HR of 49 bpm, max HR of 147 bpm, and avg HR of 70 bpm. Predominant underlying rhythm was Sinus Rhythm. Atrial Fibrillation/Flutter occurred (22% burden), ranging from 56-147 bpm (avg of 90 bpm), the longest lasting 7 hours 2 mins with  an avg rate of 78 bpm. Atrial Fibrillation/Flutter was present at activation of device. Atrial Fibrillation/Flutter was detected within +/- 45 seconds of symptomatic patient event(s). Isolated SVEs were rare (<1.0%), SVE Couplets were rare (<1.0%), and  SVE Triplets were rare (<1.0%). Isolated VEs were rare (<1.0%), and no VE Couplets or VE Triplets were present.   Conclusion Paroxysmal atrial fibrillation/atrial flutter noted.  22% burden.  Echo 07/2021  1. Left ventricular ejection fraction, by estimation, is 55 to 60%. Left  ventricular ejection fraction by 2D MOD biplane is 55.3 %. The left  ventricle has normal function. The left ventricle has no regional wall  motion abnormalities. There is mild left  ventricular hypertrophy. Left ventricular diastolic parameters are  indeterminate.   2. Right ventricular systolic function is normal. The right ventricular  size is normal.   3. The mitral valve is normal in structure. Trivial mitral valve  regurgitation.   4. The aortic valve is tricuspid. Aortic  valve regurgitation is trivial.   LHC 2021 Conclusions: No angiographically significant coronary artery disease. Normal left ventricular contraction with mildly elevated filling pressure, suggestive of  diastolic heart failure.   Recommendations: Primary prevention of coronary artery disease. Initiate furosemide 40 mg p.o. every other day, as ordered by Dr. Graciela Husbands earlier today. If no evidence of bleeding or vascular injury at right radial catheterization site, apixaban can be restarted tomorrow. Follow-up with Dr. Azucena Cecil or APP in ~2 weeks.   Yvonne Kendall, MD [facility]    EKG:  EKG is ordered today.  The ekg ordered today demonstrates NSR frequent PACs 89bpm,  q waves inf leads, TWI inf/lat leads  Recent Labs: 12/04/2022: Hemoglobin 12.3; Platelets 345 02/20/2023: ALT 9; BUN 6; Creatinine, Ser 0.71; Potassium 4.2; Sodium 138  Recent Lipid Panel    Component Value Date/Time   CHOL 174 02/20/2023 1444   TRIG 157 (H) 02/20/2023 1444   HDL 57 02/20/2023 1444   CHOLHDL 3.1 02/20/2023 1444   CHOLHDL 2.8 08/05/2021 0213   VLDL 15 08/05/2021 0213   LDLCALC 90 02/20/2023 1444    Physical Exam:    VS:  BP 103/65 (BP Location: Left Arm, Patient Position: Sitting, Cuff Size: Normal)   Pulse 89   Ht 5\' 3"  (1.6 m)   Wt 145 lb (65.8 kg)   SpO2 98%   BMI 25.69 kg/m     Wt Readings from Last 3 Encounters:  04/06/23 145 lb (65.8 kg)  03/12/23 145 lb (65.8 kg)  03/01/23 145 lb (65.8 kg)     GEN:  Well nourished, well developed in no acute distress HEENT: Normal NECK: No JVD; No carotid bruits LYMPHATICS: No lymphadenopathy CARDIAC: RRR, no murmurs, rubs, gallops RESPIRATORY:  Clear to auscultation without rales, wheezing or rhonchi  ABDOMEN: Soft, non-tender, non-distended MUSCULOSKELETAL:  No edema; No deformity  SKIN: Warm and dry NEUROLOGIC:  Alert and oriented x 3 PSYCHIATRIC:  Normal affect   ASSESSMENT:    1. Persistent atrial fibrillation (HCC)   2.  Atrial flutter, unspecified type (HCC)   3. OSA (obstructive sleep apnea)   4. Essential hypertension    PLAN:    In order of problems listed above:  Persistent Afib Afib has been difficult to manage. She has undergone 3 ablations, most recent in 11/2022. Initially, she felt better, but is now experiencing frequent afib episodes. She feels chest pain, SOB and tiredness when in afib. It greatly affects her quality of life. She is too young for amio, but says it improved afib. Per EP note, Tikosyn is the next option. EKG today shows NSR with frequent PACs (PACs new). I will repeat a 1 week heart monitor. I will given Cardizem 30mg  to use as needed for elevated HR. I will have her follow-up with EP to discuss Tikosyn. She reports compliance with Eliquis, she denies bleeding issues.   OSA She stopped using CPAP a year ago due to difficulty in tolerating the mask. She has tried multiple mask types. I will refer to pulmonology to discuss CPAP options.   Fluctuating Blood pressures Patient reports fluctuating blood pressures. BP today 103/65. She takes Cardizem 240mg  daily. May be from stress due to worsening afib. Plan on giving short active Cardizem as above.   Disposition: Follow up in 1-2 weeks with EP APP    Signed, Tylek Boney David Stall, PA-C  04/06/2023 10:13 AM    [facility]

## 2023-04-06 ENCOUNTER — Encounter: Payer: Self-pay | Admitting: Medical

## 2023-04-06 ENCOUNTER — Encounter: Payer: Self-pay | Admitting: Family Medicine

## 2023-04-06 ENCOUNTER — Ambulatory Visit: Payer: Medicare Other

## 2023-04-06 ENCOUNTER — Ambulatory Visit: Payer: Medicare Other | Attending: Medical | Admitting: Medical

## 2023-04-06 VITALS — BP 103/65 | HR 89 | Ht 63.0 in | Wt 145.0 lb

## 2023-04-06 DIAGNOSIS — I4892 Unspecified atrial flutter: Secondary | ICD-10-CM

## 2023-04-06 DIAGNOSIS — G4733 Obstructive sleep apnea (adult) (pediatric): Secondary | ICD-10-CM

## 2023-04-06 DIAGNOSIS — I4819 Other persistent atrial fibrillation: Secondary | ICD-10-CM

## 2023-04-06 DIAGNOSIS — I4811 Longstanding persistent atrial fibrillation: Secondary | ICD-10-CM

## 2023-04-06 DIAGNOSIS — I10 Essential (primary) hypertension: Secondary | ICD-10-CM | POA: Diagnosis not present

## 2023-04-06 MED ORDER — DILTIAZEM HCL 30 MG PO TABS: 30.0000 mg | ORAL_TABLET | ORAL | 0 refills | Status: DC | PRN

## 2023-04-06 MED ORDER — DILTIAZEM HCL 30 MG PO TABS: 30.0000 mg | ORAL_TABLET | Freq: Every day | ORAL | 0 refills | Status: DC | PRN

## 2023-04-06 NOTE — Patient Instructions (Addendum)
Medication Instructions:   Take Cardizem 30 daily as needed for HR above 120 or elevated blood pressure  *If you need a refill on your cardiac medications before your next appointment, please call your pharmacy*   Testing/Procedures:  ZIO XT- Long Term Monitor Instructions  Your physician has requested you wear a ZIO patch monitor for 14 days.  This is a single patch monitor. [employer] supplies one patch monitor per enrollment. Additional stickers are not available. Please do not apply patch if you will be having a Nuclear Stress Test,  Echocardiogram, Cardiac CT, MRI, or Chest Xray during the period you would be wearing the  monitor. The patch cannot be worn during these tests. You cannot remove and re-apply the  ZIO XT patch monitor.  Your ZIO patch monitor will be mailed 3 day USPS to your address on file. It may take 3-5 days  to receive your monitor after you have been enrolled.  Once you have received your monitor, please review the enclosed instructions. Your monitor  has already been registered assigning a specific monitor serial # to you.  Billing and Patient Assistance Program Information  We have supplied [employer] with any of your insurance information on file for billing purposes. [employer] offers a sliding scale Patient Assistance Program for patients that do not have  insurance, or whose insurance does not completely cover the cost of the ZIO monitor.  You must apply for the Patient Assistance Program to qualify for this discounted rate.  To apply, please call [employer] at [phone], select option 4, select option 2, ask to apply for  Patient Assistance Program. Meredeth Ide will ask your household income, and how many people  are in your household. They will quote your out-of-pocket cost based on that information.  [employer] will also be able to set up a 23-month, interest-free payment plan if needed.  Applying the monitor   Shave hair from upper left chest.  Hold abrader disc  by orange tab. Rub abrader in 40 strokes over the upper left chest as  indicated in your monitor instructions.  Clean area with 4 enclosed alcohol pads. Let dry.  Apply patch as indicated in monitor instructions. Patch will be placed under collarbone on left  side of chest with arrow pointing upward.  Rub patch adhesive wings for 2 minutes. Remove white label marked "1". Remove the white  label marked "2". Rub patch adhesive wings for 2 additional minutes.  While looking in a mirror, press and release button in center of patch. A small green light will  flash 3-4 times. This will be your only indicator that the monitor has been turned on.  Do not shower for the first 24 hours. You may shower after the first 24 hours.  Press the button if you feel a symptom. You will hear a small click. Record Date, Time and  Symptom in the Patient Logbook.  When you are ready to remove the patch, follow instructions on the last 2 pages of Patient  Logbook. Stick patch monitor onto the last page of Patient Logbook.  Place Patient Logbook in the blue and white box. Use locking tab on box and tape box closed  securely. The blue and white box has prepaid postage on it. Please place it in the mailbox as  soon as possible. Your physician should have your test results approximately 7 days after the  monitor has been mailed back to [facility].  Call [facility] Customer Care at [phone] if you have questions regarding  your ZIO XT patch monitor. Call them immediately if you see an orange light blinking on your  monitor.  If your monitor falls off in less than 4 days, contact our Monitor department at [phone].  If your monitor becomes loose or falls off after 4 days call [employer] at [phone] for  suggestions on securing your monitor    Follow-Up: At [facility], you and your health needs are our priority.  As part of our continuing mission to provide you with exceptional heart care,  we have created designated Provider Care Teams.  These Care Teams include your primary Cardiologist (physician) and Advanced Practice Providers (APPs -  Physician Assistants and Nurse Practitioners) who all work together to provide you with the care you need, when you need it.  We recommend signing up for the patient portal called "MyChart".  Sign up information is provided on this After Visit Summary.  MyChart is used to connect with patients for Virtual Visits (Telemedicine).  Patients are able to view lab/test results, encounter notes, upcoming appointments, etc.  Non-urgent messages can be sent to your provider as well.   To learn more about what you can do with MyChart, go to [email or web address].    Your next appointment:   Next opening   Provider:   Sherie Don, NP

## 2023-04-10 ENCOUNTER — Other Ambulatory Visit: Payer: Self-pay

## 2023-04-10 DIAGNOSIS — B001 Herpesviral vesicular dermatitis: Secondary | ICD-10-CM

## 2023-04-10 MED ORDER — VALACYCLOVIR HCL 1 G PO TABS: ORAL_TABLET | ORAL | 5 refills | Status: AC

## 2023-04-11 ENCOUNTER — Ambulatory Visit
Admission: RE | Admit: 2023-04-11 | Discharge: 2023-04-11 | Disposition: A | Discharge status: Home / Self Care | Payer: Medicare Other | Source: Ambulatory Visit | Attending: Family Medicine | Admitting: Family Medicine

## 2023-04-11 DIAGNOSIS — Z1231 Encounter for screening mammogram for malignant neoplasm of breast: Secondary | ICD-10-CM | POA: Insufficient documentation

## 2023-04-15 ENCOUNTER — Other Ambulatory Visit: Payer: Self-pay | Admitting: Family Medicine

## 2023-04-16 ENCOUNTER — Encounter: Payer: Self-pay | Admitting: Family Medicine

## 2023-04-17 NOTE — Telephone Encounter (Signed)
 Requested medication (s) are due for refill today: -  Requested medication (s) are on the active medication list: historical med  Last refill:  06/30/22  Future visit scheduled: yes  Notes to clinic:  historical provider   Requested Prescriptions  Pending Prescriptions Disp Refills   diltiazem  (CARDIZEM  CD) 240 MG 24 hr capsule [Pharmacy Med Name: DILTIAZEM  24H ER(CD) 240 MG CP] 90 capsule 3    Sig: TAKE 1 CAPSULE BY MOUTH EVERY DAY     Cardiovascular: Calcium  Channel Blockers 3 Passed - 04/17/2023  8:57 AM      Passed - ALT in normal range and within 360 days    ALT  Date Value Ref Range Status  02/20/2023 9 0 - 32 IU/L Final   SGPT (ALT)  Date Value Ref Range Status  02/26/2013 23 12 - 78 U/L Final         Passed - AST in normal range and within 360 days    AST  Date Value Ref Range Status  02/20/2023 17 0 - 40 IU/L Final   SGOT(AST)  Date Value Ref Range Status  02/26/2013 31 15 - 37 Unit/L Final         Passed - Cr in normal range and within 360 days    Creatinine  Date Value Ref Range Status  11/03/2022 0.83 0.44 - 1.00 mg/dL Final   Creat  Date Value Ref Range Status  12/07/2016 0.85 0.50 - 1.05 mg/dL Final    Comment:    For patients >82 years of age, the reference limit for Creatinine is approximately 13% higher for people identified as African-American. .    Creatinine, Ser  Date Value Ref Range Status  02/20/2023 0.71 0.57 - 1.00 mg/dL Final         Passed - Last BP in normal range    BP Readings from Last 1 Encounters:  04/06/23 103/65         Passed - Last Heart Rate in normal range    Pulse Readings from Last 1 Encounters:  04/06/23 89         Passed - Valid encounter within last 6 months    Recent Outpatient Visits           1 month ago Encounter for annual physical exam   [facility] [place], Jon HERO, MD   3 months ago Acute right flank pain   [facility] Mecum, Rocky BRAVO,  PA-C   3 months ago Urinary tract infection with hematuria, site unspecified   [facility] Leavy Mole, PA-C   10 months ago Pain in female genitalia on intercourse   [facility], Jon HERO, MD   10 months ago No-show for appointment   [facility] Cyndi Shaver, PA-C       Future Appointments             In 1 week Riddle, Suzann, NP [facility] [place]   In 1 month Cindie, Ole DASEN, MD [facility] [place]   In 10 months Bacigalupo, Jon HERO, MD [facility]

## 2023-04-24 ENCOUNTER — Ambulatory Visit: Payer: Medicare Other | Admitting: Cardiology

## 2023-04-24 ENCOUNTER — Telehealth: Payer: Self-pay

## 2023-04-24 NOTE — Telephone Encounter (Signed)
-----   Message from Hominy Riddle sent at 04/23/2023  5:43 PM EST ----- Regarding: zio This patient recently saw Cadence who ordered a 1 week zio, and it has not resulted yet.  I'm happy to see her Tuesday to discuss, or if she would prefer to wait a week so that I have the zio results, that's fine too. Just want her to be aware that the zio has not resulted.  Can you pleaes call her?   Thanks

## 2023-04-24 NOTE — Telephone Encounter (Signed)
Called patient, LVM to call back to get rescheduled with Sherie Don, NP.  Left call back number.

## 2023-04-25 ENCOUNTER — Encounter: Payer: Self-pay | Admitting: Internal Medicine

## 2023-04-25 ENCOUNTER — Ambulatory Visit: Payer: Medicare Other | Admitting: Internal Medicine

## 2023-04-25 VITALS — BP 114/80 | HR 69 | Temp 97.6°F | Ht 63.0 in | Wt 144.2 lb

## 2023-04-25 DIAGNOSIS — G4733 Obstructive sleep apnea (adult) (pediatric): Secondary | ICD-10-CM | POA: Diagnosis not present

## 2023-04-25 DIAGNOSIS — I4811 Longstanding persistent atrial fibrillation: Secondary | ICD-10-CM | POA: Diagnosis not present

## 2023-04-25 DIAGNOSIS — I4892 Unspecified atrial flutter: Secondary | ICD-10-CM | POA: Diagnosis not present

## 2023-04-25 NOTE — Progress Notes (Unsigned)
Name: [name] MRN: [id number] DOB: 01/24/1961    CHIEF COMPLAINT:  EXCESSIVE DAYTIME SLEEPINESS Assessment of sleep apnea   HISTORY OF PRESENT ILLNESS: 63 year old white female seen today for assessment of sleep apnea  patient has been having extreme fatigue and tiredness, lack of energy +  very Loud snoring every night + struggling breathe at night and gasps for air + morning headaches + Nonrefreshing sleep  All of the symptoms above-mentioned led to sleep study assessment in 2021 Findings reported below, reviewed in detail with patient AHI of 9 with REM AHI of 17.6 Based on these findings mild to moderate OSA based sleep cycle  Discussed sleep data and reviewed with patient.  Encouraged proper weight management.  Discussed driving precautions and its relationship with hypersomnolence.  Discussed operating dangerous equipment and its relationship with hypersomnolence.  Discussed sleep hygiene, and benefits of a fixed sleep waked time.  The importance of getting eight or more hours of sleep discussed with patient.  Discussed limiting the use of the computer and television before bedtime.  Decrease naps during the day, so night time sleep will become enhanced.  Limit caffeine, and sleep deprivation.  HTN, stroke, and heart failure are potential risk factors.    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/2004), Anemia, Anxiety, Asthma, Cephalalgia (08/18/2014), Cervical disc disease (08/18/2014), Chronic headaches, Chronic, continuous use of opioids, DDD (degenerative  disc disease), Depression, Dissociative disorder, Dizziness (04/22/2013), Duodenal ulcer with hemorrhage and perforation (HCC) (04/27/2003), Foot drop, right, GERD (gastroesophageal reflux disease), H/O arthrodesis (08/18/2014), Headache(784.0), History of blood transfusion, History of cardiac cath, History of cardioversion, History of cervical spinal surgery (01/04/2015), History of kidney stones, Hypertension, Inverted T wave, Iron deficiency (02/01/2021), Mitral regurgitation, Narrowing of intervertebral disc space (08/18/2014), Orthostatic hypotension (04/22/2013), Pain, Paroxysmal atrial fibrillation (HCC) (2021), Pneumonia, PONV (postoperative nausea and vomiting), Postop Hyponatremia (05/14/2012), Postoperative anemia due to acute blood loss (05/14/2012), PTSD (post-traumatic stress disorder), Right foot drop, Right hip arthralgia (08/18/2014), Sleep apnea (2021), Therapeutic opioid-induced constipation (OIC), and Typical atrial flutter (HCC).  has a past surgical history that includes RIGHT HIP ARTHROSCOPY FOR LABRAL TEAR (ABOUT 2010); Anterior fusion cervical spine (MAY 2012); Nasal septum surgery [facility] 2013); Back surgery (2009); Tonsillectomy; Abdominal hysterectomy; DIAGNOSTIC LAPAROSCOPIES - MULTIPLE FOR ENDOMETRIOSIS; Cholecystectomy; Hip arthroscopy (09/20/2011); Shoulder arthroscopy (05-06-12); Carpal tunnel release (05-06-12); Total hip arthroplasty (Right, 05/13/2012); Anterior cervical decomp/discectomy fusion (N/A, 10/30/2012); Posterior cervical fusion/foraminotomy (N/A, 04/16/2013); Breast reduction surgery (Bilateral, 06/2016); Colonoscopy with propofol (N/A, 01/26/2017); Esophagogastroduodenoscopy (egd) with propofol (N/A, 01/26/2017); Esophageal dilation (N/A, 01/26/2017); polypectomy (N/A, 01/26/2017); PH impedance study (N/A, 05/30/2017); Esophageal manometry (N/A, 05/30/2017); Radiofrequency ablation nerves; afib (05/2019); LEFT HEART CATH AND CORONARY ANGIOGRAPHY (Left, 06/10/2019); Cardioversion  (N/A, 12/03/2019); Esophagogastroduodenoscopy (egd) with propofol (N/A, 01/30/2020); Reduction mammaplasty (Bilateral, 05/2016); Breast biopsy (Right, 2008); Breast excisional biopsy (Left, 1998); TEE without cardioversion (N/A, 08/05/2020); ATRIAL FIBRILLATION ABLATION (N/A, 08/06/2020); Cardiac electrophysiology mapping and ablation; Esophagogastroduodenoscopy (egd) with propofol (N/A, 01/15/2021); Colonoscopy with propofol (N/A, 01/16/2021); Cardioversion (N/A, 08/01/2021); Cardioversion (N/A, 08/05/2021); Esophagogastroduodenoscopy (egd) with propofol (N/A, 12/07/2021); Lower Extremity Angiography (Left, 01/06/2022); and ATRIAL FIBRILLATION ABLATION (N/A, 12/04/2022). Prior  to Admission medications   Medication Sig Start Date End Date Taking? Authorizing Provider  acetaminophen (TYLENOL) 325 MG tablet Take 2 tablets (650 mg total) by mouth every 6 (six) hours as needed for mild pain (or Fever >/= 101). Patient taking differently: Take 1,000 mg by mouth every 6 (six) hours as needed for mild pain (pain score 1-3) (or Fever >/= 101). 01/07/22   Alford Highland, MD  ALPRAZolam Prudy Feeler) 1 MG tablet Take 1 mg by mouth at bedtime.  07/04/19   [provider]  cholecalciferol (VITAMIN D3) 25 MCG (1000 UNIT) tablet Take 1,000 Units by mouth daily.    [provider]  diazepam (VALIUM) 5 MG tablet PLACE 1 TABLET VAGINALLY NIGHTLY AS NEEDED FOR MUSCLE SPASM/ PELVIC PAIN. 10/25/22   Selmer Dominion, NP  diltiazem (CARDIZEM CD) 240 MG 24 hr capsule Take 240 mg by mouth daily.    [provider]  diltiazem (CARDIZEM) 30 MG tablet Take 1 tablet (30 mg total) by mouth daily as needed (elevated bp or hr above 120). 04/06/23   Furth, Cadence H, PA-C  ELIQUIS 5 MG TABS tablet TAKE 1 TABLET BY MOUTH TWICE A DAY 10/23/22   Bacigalupo, Marzella Schlein, MD  linaclotide Karlene Einstein) 290 MCG CAPS capsule Take 1 capsule (290 mcg total) by mouth daily before breakfast. 02/19/23   Vanga, Loel Dubonnet, MD  Mometasone  Furoate (ALLERGY NASAL SPRAY NA) Place 1 spray into the nose daily as needed (allergies/ decongestion).    [provider]  montelukast (SINGULAIR) 10 MG tablet TAKE 1 TABLET BY MOUTH EVERY DAY 02/12/23   Bacigalupo, Marzella Schlein, MD  morphine (MSIR) 15 MG tablet Take 1 tablet (15 mg total) by mouth every 6 (six) hours as needed for moderate pain (pain score 4-6) or severe pain (pain score 7-10). Must last 30 days. 03/21/23 04/20/23  Delano Metz, MD  morphine (MSIR) 15 MG tablet Take 1 tablet (15 mg total) by mouth every 6 (six) hours as needed for moderate pain (pain score 4-6) or severe pain (pain score 7-10). Must last 30 days. 04/20/23 05/20/23  Delano Metz, MD  morphine (MSIR) 15 MG tablet Take 1 tablet (15 mg total) by mouth every 6 (six) hours as needed for moderate pain (pain score 4-6) or severe pain (pain score 7-10). Must last 30 days. 05/20/23 06/19/23  Delano Metz, MD  naloxone [facility]) nasal spray 4 mg/0.1 mL Place 1 spray into the nose as needed for up to 365 doses (for opioid-induced respiratory depresssion). In case of emergency (overdose), spray once into each nostril. If no response within 3 minutes, repeat application and call 911. 12/13/22 12/13/23  Delano Metz, MD  ondansetron (ZOFRAN-ODT) 4 MG disintegrating tablet Take 1 tablet (4 mg total) by mouth every 8 (eight) hours as needed for nausea or vomiting. 12/26/22   Danelle Berry, PA-C  pantoprazole (PROTONIX) 40 MG tablet Take 1 tablet (40 mg total) by mouth daily before breakfast. 01/03/23   Vanga, Loel Dubonnet, MD  valACYclovir (VALTREX) 1000 MG tablet TAKE TWO TABLETS BY MOUTH TWICE DAILY FOR ONE DAY FEVER BLISTERTAKE TWO TABLETS BY MOUTH TWICE DAILY FOR ONE DAY FEVER BLISTER 04/10/23   Erasmo Downer, MD   Allergies  Allergen Reactions   Amoxicillin Hives    She did ok w ANCEF   Chlorhexidine Gluconate Dermatitis and Hives   Clindamycin Hives   Codeine Hives   Erythromycin Hives    "mycins" in  general   Penicillin G Hives    "  cillins" in general   Sulfa Antibiotics Nausea And Vomiting and Hives        Levofloxacin Hives   Shellfish Allergy Hives   Decadron [Dexamethasone] Other (See Comments)    Hot flashes, insomnia, "manic" Hot flashes, insomnia, "manic"   Flecainide Swelling    Pts said she signs of going into heart failure. Adema   Mangifera Indica Hives    papaya   Papaya Derivatives Hives   Betadine [Povidone Iodine] Hives        Clarithromycin Hives   Other Hives     Mango    Povidone-Iodine Hives   Prednisone Anxiety    High blood pressure, flushed, mood changes, heart palpitations High blood pressure, flushed, mood changes, heart palpitations    FAMILY HISTORY:  family history includes Aneurysm in her maternal grandmother and mother; Anxiety disorder in her grandchild; Bipolar disorder in her grandchild and sister; Breast cancer (age of onset: 64) in her paternal grandmother; Depression in her grandchild. SOCIAL HISTORY:  reports that she has never smoked. She has never used smokeless tobacco. She reports current drug use. She reports that she does not drink alcohol.   Review of Systems:  Gen:  Denies  fever, sweats, chills weight loss  HEENT: Denies blurred vision, double vision, ear pain, eye pain, hearing loss, nose bleeds, sore throat Cardiac:  No dizziness, chest pain or heaviness, chest tightness,edema, No JVD Resp:   No cough, -sputum production, -shortness of breath,-wheezing, -hemoptysis,  Gi: Denies swallowing difficulty, stomach pain, nausea or vomiting, diarrhea, constipation, bowel incontinence Gu:  Denies bladder incontinence, burning urine Ext:   Denies Joint pain, stiffness or swelling Skin: Denies  skin rash, easy bruising or bleeding or hives Endoc:  Denies polyuria, polydipsia , polyphagia or weight change Psych:   Denies depression, insomnia or hallucinations  Other:  All other systems negative   ALL OTHER ROS ARE  NEGATIVE    VITAL SIGNS: @VSRANGES @   Physical Examination:   General Appearance: No distress  EYES PERRLA, EOM intact.   NECK Supple, No JVD ORAL CAVITY MALLAMPATI 4 Pulmonary: normal breath sounds, No wheezing.  CardiovascularNormal S1,S2.  No m/r/g.   Abdomen: Benign, Soft, non-tender. Skin:   warm, no rashes, no ecchymosis  Extremities: normal, no cyanosis, clubbing. Neuro:without focal findings,  speech normal  PSYCHIATRIC: Mood, affect within normal limits.   ALL OTHER ROS ARE NEGATIVE    ASSESSMENT AND PLAN SYNOPSIS  63 year old pleasant white female patient with signs and symptoms of excessive daytime sleepiness with underlying mild to moderate  diagnosis of obstructive sleep apnea which is based on sleep cycle and positioning in the setting of obesity and deconditioned state   Recommend Sleep Study for definitve diagnosis  Obesity -recommend significant weight loss -recommend changing diet  Deconditioned state -Recommend increased daily activity and exercise   MEDICATION ADJUSTMENTS/LABS AND TESTS ORDERED: Recommend Sleep Study Recommend weight loss   CURRENT MEDICATIONS REVIEWED AT LENGTH WITH PATIENT TODAY   Patient  satisfied with Plan of action and management. All questions answered  Follow up  3 months   I spent a total of  65 minutes reviewing chart data, face-to-face evaluation with the patient, counseling and coordination of care as detailed above.    Lucie Leather, M.D.  [facility]  Medical Director [facility] Medical Director [facility] Cardio-Pulmonary Department

## 2023-04-25 NOTE — Patient Instructions (Signed)
Referral to DME company [employer] [id number] MASK Start AUTO CPAP 4-12 cm h20  Avoid Allergens and Irritants Avoid secondhand smoke Avoid SICK contacts Recommend  Masking  when appropriate Recommend Keep up-to-date with vaccinations   Be aware of reduced alertness and do not drive or operate heavy machinery if experiencing this or drowsiness.  Exercise encouraged, as tolerated. Encouraged proper weight management.  Important to get eight or more hours of sleep  Limiting the use of the computer and television before bedtime.  Decrease naps during the day, so night time sleep will become enhanced.  Limit caffeine, and sleep deprivation.

## 2023-04-29 NOTE — Progress Notes (Unsigned)
Cardiology Clinic Note    Date:  04/30/2023  Patient ID:  [name] [birth year], MRN [id number] PCP:  Erasmo Downer, MD  Cardiologist:  Debbe Odea, MD Electrophysiologist: Lanier Prude, MD     Patient Profile    Chief Complaint: AF follow-up  History of Present Illness: [name] is a 63 y.o. female with PMH notable for persis AFib, HTN, OSA; seen today for Lanier Prude, MD for routine electrophysiology followup.  She is s/p AFib ablation 2022, 2023 both at [facility], and repeat AFib, atypical flutter ablation 11/2022 w Dr. Lalla Brothers.  She saw PA Nelva Bush 01/01/2023 for routine 1 mon post-procedure and was having paroxysms of afib, amio stopped d/t young age.  I saw her for 3mon post-ablation appt where she was having brief palpitation episodes ~30 in duration, no sustained episodes.   She saw PA Fransico Michael 03/2023 where she was having increased AF episodes. PRN dilt ordered and zio ordered.   On follow-up today, she has been in an AFib episode since 7am this morning. This is the longest AF episode she has had since her most recent ablation in Sept '24.  She did not take her PRN dilt today, thought she was only supposed to take it if her diastolic BP reading was or higher. She is currently dizzy with SOB and chest discomfort.  Diligently takes eliquis BID, no missed doses, no bleeding concerns.    Currently,  she denies PND, orthopnea, nausea, vomiting, syncope, edema, weight gain, or early satiety.      AAD History: Amiodarone - stopped d/t young age after 11/2022 ablation. Patient says it was ineffective. Flecainide - SE: headache, lower extremity edema    ROS:  Please see the history of present illness. All other systems are reviewed and otherwise negative.    Physical Exam    VS:  BP 126/80 (BP Location: Left Arm, Patient Position: Sitting, Cuff Size: Normal)   Pulse (!) 141   Ht 5\' 3"  (1.6 m)   Wt 144 lb 12.8 oz (65.7 kg)   SpO2  98%   BMI 25.65 kg/m  BMI: Body mass index is 25.65 kg/m.  Wt Readings from Last 3 Encounters:  04/30/23 144 lb 12.8 oz (65.7 kg)  04/25/23 144 lb 3.2 oz (65.4 kg)  04/06/23 145 lb (65.8 kg)     GEN- The patient is ill- appearing, alert and oriented x 3 today.   Lungs- Clear to ausculation bilaterally, normal work of breathing.  Heart- Irregularly irregular, tachycardic rate and rhythm, no murmurs, rubs or gallops Extremities- No peripheral edema, warm, dry    Studies Reviewed   Previous EP, cardiology notes.    EKG is ordered. Personal review of EKG from today shows:    EKG Interpretation Date/Time:  Monday April 30 2023 12:56:37 EST Ventricular Rate:  141 PR Interval:    QRS Duration:  80 QT Interval:  296 QTC Calculation: 453 R Axis:   2  Text Interpretation: Atrial flutter with rapid ventricular response Minimal voltage criteria for LVH, may be normal variant ( R in aVL ) Reconfirmed by Sherie Don [phone]) on 04/30/2023 2:31:59 PM    Long term monitor, 04/25/2023 (unofficial report) Patient had a min HR of 46 bpm, max HR of 179 bpm, and avg HR of 61 bpm. Predominant underlying rhythm was Sinus Rhythm. 117 Supraventricular Tachycardia runs occurred, the run with the fastest interval lasting 6 beats with a max rate of 179 bpm, the  longest lasting 17 beats with an avg rate of 103 bpm. Isolated SVEs were occasional (3.8%, [address]), SVE Couplets were rare (<1.0%, 2874), and SVE Triplets were rare (<1.0%, 483). Isolated VEs were rare (<1.0%), VE Couplets were rare (<1.0%), and no VE  Triplets were present.   TTE, 07/31/2021  1. Left ventricular ejection fraction, by estimation, is 55 to 60%. Left ventricular ejection fraction by 2D MOD biplane is 55.3 %. The left ventricle has normal function. The left ventricle has no regional wall motion abnormalities. There is mild left  ventricular hypertrophy. Left ventricular diastolic parameters are indeterminate.   2. Right  ventricular systolic function is normal. The right ventricular size is normal.   3. The mitral valve is normal in structure. Trivial mitral valve regurgitation.   4. The aortic valve is tricuspid. Aortic valve regurgitation is trivial.      Assessment and Plan    #) persis AFib #) atrial flutter S/p AF ablation x 3, most recently 11/2022 Initially had significant improvement in AFib burden, though has noticed a steady increase in episodes  Amiodarone stopped ~11/2022 She does not want to proceed to ER at this time Recommended she take PRN dilt when she returns home, and if HR lowers then will proceed with DCCV at next opportunity If PRN dilt is ineffective, recommend she proceed to ER for DCCV CBC, Bmet today  Tikosyn appears to be an appropriate AAD option in future. Discussed that she would need to stop PRN zofran prior to initiating tikosyn, which she is agreeable to Will msg AF clinic to coordinate tikosyn admission   #) Hypercoag d/t persis afib CHA2DS2-VASc Score = at least 3 [CHF History: 0, HTN History: 1, Diabetes History: 0, Stroke History: 0, Vascular Disease History: 1, Age Score: 0, Gender Score: 1].  Therefore, the patient's annual risk of stroke is 3.2 %.    Stroke ppx - 5mg  eliquis BID, appropriately dosed No bleeding concerns  #) HTN Well controlled in office      Current medicines are reviewed at length with the patient today.   The patient does not have concerns regarding her medicines.  The following changes were made today:  none  Labs/ tests ordered today include:  Orders Placed This Encounter  Procedures   CBC   Basic metabolic panel   EKG 12-Lead     Disposition: Follow up with Dr. Lalla Brothers or EP APP in 2 weeks    Signed, Sherie Don, NP  04/30/23  2:34 PM  Electrophysiology [facility]

## 2023-04-30 ENCOUNTER — Other Ambulatory Visit
Admission: RE | Admit: 2023-04-30 | Discharge: 2023-04-30 | Disposition: A | Discharge status: Home / Self Care | Payer: Medicare Other | Source: Ambulatory Visit | Attending: Cardiology | Admitting: Cardiology

## 2023-04-30 ENCOUNTER — Ambulatory Visit: Payer: Medicare Other | Attending: Cardiology | Admitting: Cardiology

## 2023-04-30 ENCOUNTER — Encounter: Payer: Self-pay | Admitting: Cardiology

## 2023-04-30 ENCOUNTER — Other Ambulatory Visit: Payer: Self-pay | Admitting: Cardiovascular Disease

## 2023-04-30 VITALS — BP 126/80 | HR 141 | Ht 63.0 in | Wt 144.8 lb

## 2023-04-30 DIAGNOSIS — D6869 Other thrombophilia: Secondary | ICD-10-CM

## 2023-04-30 DIAGNOSIS — I4811 Longstanding persistent atrial fibrillation: Secondary | ICD-10-CM

## 2023-04-30 DIAGNOSIS — G4733 Obstructive sleep apnea (adult) (pediatric): Secondary | ICD-10-CM

## 2023-04-30 LAB — CBC
HCT: 40.5 % (ref 36.0–46.0)
Hemoglobin: 13.9 g/dL (ref 12.0–15.0)
MCH: 29.6 pg (ref 26.0–34.0)
MCHC: 34.3 g/dL (ref 30.0–36.0)
MCV: 86.2 fL (ref 80.0–100.0)
Platelets: 399 10*3/uL (ref 150–400)
RBC: 4.7 MIL/uL (ref 3.87–5.11)
RDW: 13.1 % (ref 11.5–15.5)
WBC: 7.6 10*3/uL (ref 4.0–10.5)
nRBC: 0 % (ref 0.0–0.2)

## 2023-04-30 LAB — BASIC METABOLIC PANEL
Anion gap: 7 (ref 5–15)
BUN: 10 mg/dL (ref 8–23)
CO2: 27 mmol/L (ref 22–32)
Calcium: 9.6 mg/dL (ref 8.9–10.3)
Chloride: 104 mmol/L (ref 98–111)
Creatinine, Ser: 0.86 mg/dL (ref 0.44–1.00)
GFR, Estimated: >60 mL/min (ref >60)
Glucose, Bld: 107 mg/dL — ABNORMAL HIGH (ref 70–99)
Potassium: 3.8 mmol/L (ref 3.5–5.1)
Sodium: 138 mmol/L (ref 135–145)

## 2023-04-30 NOTE — Patient Instructions (Addendum)
Medication Instructions:  No changes *If you need a refill on your cardiac medications before your next appointment, please call your pharmacy*   Lab Work: Your provider would like for you to have the following labs today: BMET and CBC  If you have labs (blood work) drawn today and your tests are completely normal, you will receive your results only by: MyChart Message (if you have MyChart) OR A paper copy in the mail If you have any lab test that is abnormal or we need to change your treatment, we will call you to review the results.  Follow-Up: At [facility], you and your health needs are our priority.  As part of our continuing mission to provide you with exceptional heart care, we have created designated Provider Care Teams.  These Care Teams include your primary Cardiologist (physician) and Advanced Practice Providers (APPs -  Physician Assistants and Nurse Practitioners) who all work together to provide you with the care you need, when you need it.  We recommend signing up for the patient portal called "MyChart".  Sign up information is provided on this After Visit Summary.  MyChart is used to connect with patients for Virtual Visits (Telemedicine).  Patients are able to view lab/test results, encounter notes, upcoming appointments, etc.  Non-urgent messages can be sent to your provider as well.   To learn more about what you can do with MyChart, go to [email or web address].    Your next appointment:   2 week(s)  Provider:   Sherie Don, NP    Other Instructions     Dear [name]  You are scheduled for a Cardioversion on Wednesday, February 19 with Dr. Mariah Milling.  Please arrive at the [facility] Entrance of [facility], [address], [place] [address] at 7:00 AM (This is 1 hour(s) prior to your procedure time).  Proceed to the Check-In Desk directly inside the entrance.  Procedure Parking: Use the entrance off of the [address] side of the  hospital. Turn right upon entering and follow the driveway to parking that is directly in front of the [facility]. There is no valet parking available at this entrance, however there is an awning directly in front of the [facility] for drop off/ pick up for patients.    DIET:  Nothing to eat or drink after midnight except a sip of water with medications (see medication instructions below)  MEDICATION INSTRUCTIONS: !!IF ANY NEW MEDICATIONS ARE STARTED AFTER TODAY, PLEASE NOTIFY YOUR PROVIDER AS SOON AS POSSIBLE!!  FYI: Medications such as Semaglutide (Ozempic, [place]), Tirzepatide (Mounjaro, Zepbound), Dulaglutide (Trulicity), etc ("GLP1 agonists") AND Canagliflozin (Invokana), Dapagliflozin (Farxiga), Empagliflozin (Jardiance), Ertugliflozin (Steglatro), Bexagliflozin [employer]) or any combination with one of these drugs such as Invokamet (Canagliflozin/Metformin), Synjardy (Empagliflozin/Metformin), etc ("SGLT2 inhibitors") must be held around the time of a procedure. This is not a comprehensive list of all of these drugs. Please review all of your medications and talk to your provider if you take any one of these. If you are not sure, ask your provider.   Medications: Nothing to hold  Apixaban (Eliquis): You will need to continue this after your procedure until you are told by your provider that it is safe to stop.  If you miss a dose, please call the office at [phone]  FYI:  For your safety, and to allow [place] to monitor your vital signs accurately during the surgery/procedure we request: If you have artificial nails, gel coating,  SNS etc, please have those removed prior to your surgery/procedure. Not having the nail coverings /polish removed may result in cancellation or delay of your surgery/procedure.  Your support person will be asked to wait in the waiting room during your procedure.  It is OK to have someone drop you off and come back when you are ready to be  discharged.  You cannot drive after the procedure and will need someone to drive you home.  Bring your insurance cards.  *Special Note: Every effort is made to have your procedure done on time. Occasionally there are emergencies that occur at the hospital that may cause delays. Please be patient if a delay does occur.

## 2023-05-01 ENCOUNTER — Telehealth: Payer: Self-pay | Admitting: Pharmacist

## 2023-05-01 ENCOUNTER — Telehealth: Payer: Self-pay | Admitting: Pharmacist Clinician (PhC)/ Clinical Pharmacy Specialist

## 2023-05-01 ENCOUNTER — Encounter (HOSPITAL_COMMUNITY): Payer: Self-pay

## 2023-05-01 DIAGNOSIS — H02839 Dermatochalasis of unspecified eye, unspecified eyelid: Secondary | ICD-10-CM | POA: Diagnosis not present

## 2023-05-01 DIAGNOSIS — I4819 Other persistent atrial fibrillation: Secondary | ICD-10-CM

## 2023-05-01 DIAGNOSIS — H5203 Hypermetropia, bilateral: Secondary | ICD-10-CM | POA: Diagnosis not present

## 2023-05-01 DIAGNOSIS — I4811 Longstanding persistent atrial fibrillation: Secondary | ICD-10-CM

## 2023-05-01 DIAGNOSIS — I4892 Unspecified atrial flutter: Secondary | ICD-10-CM

## 2023-05-01 DIAGNOSIS — H2513 Age-related nuclear cataract, bilateral: Secondary | ICD-10-CM | POA: Diagnosis not present

## 2023-05-01 NOTE — Telephone Encounter (Signed)
-----   Message from Nurse Milta Deiters sent at 04/30/2023  2:19 PM EST ----- Regarding: FW: Tikosyn Please review meds for tikosyn thanks stacy ----- Message ----- From: Sherie Don, NP Sent: 04/30/2023   2:14 PM EST To: Shona Simpson, RN; Darene Lamer, LPN Subject: Sherron Monday -  Can we set this patient up for a tikosyn loading admission as soon as possible? She is aware she'll need to stop zofran 1 week before starting tikosyn.   Thanks, [employer]

## 2023-05-01 NOTE — Telephone Encounter (Signed)
Medication list reviewed in anticipation of upcoming Tikosyn initiation. Patient is  taking any contraindicated or QTc prolonging medications.  (Ondansetron, noted that patient aware to stop)  Patient is anticoagulated on Eliquis on the appropriate dose. Please ensure that patient has not missed any anticoagulation doses in the 3 weeks prior to Tikosyn initiation.   Patient will need to be counseled to avoid use of Benadryl while on Tikosyn and in the 2-3 days prior to Tikosyn initiation.

## 2023-05-01 NOTE — Telephone Encounter (Signed)
error 

## 2023-05-02 ENCOUNTER — Other Ambulatory Visit (HOSPITAL_COMMUNITY): Payer: Self-pay

## 2023-05-02 ENCOUNTER — Telehealth (HOSPITAL_COMMUNITY): Payer: Self-pay | Admitting: Pharmacy Technician

## 2023-05-02 ENCOUNTER — Ambulatory Visit: Admission: RE | Admit: 2023-05-02 | Payer: Medicare Other | Source: Home / Self Care | Admitting: Cardiovascular Disease

## 2023-05-02 ENCOUNTER — Encounter: Admission: RE | Payer: Self-pay | Source: Home / Self Care

## 2023-05-02 DIAGNOSIS — I480 Paroxysmal atrial fibrillation: Secondary | ICD-10-CM

## 2023-05-02 SURGERY — CARDIOVERSION
Anesthesia: General

## 2023-05-02 NOTE — Telephone Encounter (Signed)
Patient Product/process development scientist completed.    The patient is insured through [facility]. Patient has Medicare and is not eligible for a copay card, but may be able to apply for patient assistance or Medicare RX Payment Plan (Patient Must reach out to their plan, if eligible for payment plan), if available.    Ran test claim for dofetilide (Tikosyn) 500 mcg and the current 30 day co-pay is $4.90.   This test claim was processed through [facility]- copay amounts may vary at other pharmacies due to pharmacy/plan contracts, or as the patient moves through the different stages of their insurance plan.     Roland Earl, CPHT Pharmacy Technician III Certified Patient Advocate [facility] Patient Advocate Team Direct Number: [phone]  Fax: [phone]

## 2023-05-02 NOTE — Telephone Encounter (Signed)
Pt notified no zofran prior to admission,.

## 2023-05-04 ENCOUNTER — Encounter (HOSPITAL_COMMUNITY): Payer: Self-pay

## 2023-05-08 ENCOUNTER — Other Ambulatory Visit: Payer: Self-pay

## 2023-05-08 ENCOUNTER — Other Ambulatory Visit: Payer: Medicare Other

## 2023-05-08 ENCOUNTER — Ambulatory Visit (HOSPITAL_COMMUNITY)
Admission: RE | Admit: 2023-05-08 | Discharge: 2023-05-08 | Disposition: A | Discharge status: Home / Self Care | Payer: Medicare Other | Source: Ambulatory Visit | Attending: Internal Medicine | Admitting: Internal Medicine

## 2023-05-08 ENCOUNTER — Inpatient Hospital Stay (HOSPITAL_COMMUNITY)
Admission: AD | Admit: 2023-05-08 | Discharge: 2023-05-11 | DRG: 310 | Disposition: A | Discharge status: Home / Self Care | Payer: Medicare Other | Attending: Cardiology | Admitting: Cardiology

## 2023-05-08 ENCOUNTER — Ambulatory Visit: Payer: Medicare Other | Admitting: Internal Medicine

## 2023-05-08 ENCOUNTER — Encounter (HOSPITAL_COMMUNITY): Payer: Self-pay | Admitting: Cardiology

## 2023-05-08 ENCOUNTER — Ambulatory Visit: Payer: Medicare Other

## 2023-05-08 VITALS — BP 130/82 | HR 70 | Ht 63.0 in | Wt 142.8 lb

## 2023-05-08 DIAGNOSIS — I483 Typical atrial flutter: Secondary | ICD-10-CM | POA: Diagnosis present

## 2023-05-08 DIAGNOSIS — I4819 Other persistent atrial fibrillation: Principal | ICD-10-CM | POA: Diagnosis present

## 2023-05-08 DIAGNOSIS — Z9049 Acquired absence of other specified parts of digestive tract: Secondary | ICD-10-CM | POA: Diagnosis not present

## 2023-05-08 DIAGNOSIS — Z981 Arthrodesis status: Secondary | ICD-10-CM

## 2023-05-08 DIAGNOSIS — E876 Hypokalemia: Secondary | ICD-10-CM | POA: Diagnosis not present

## 2023-05-08 DIAGNOSIS — F32A Depression, unspecified: Secondary | ICD-10-CM | POA: Diagnosis present

## 2023-05-08 DIAGNOSIS — I10 Essential (primary) hypertension: Secondary | ICD-10-CM | POA: Insufficient documentation

## 2023-05-08 DIAGNOSIS — Z91013 Allergy to seafood: Secondary | ICD-10-CM | POA: Diagnosis not present

## 2023-05-08 DIAGNOSIS — Z79899 Other long term (current) drug therapy: Secondary | ICD-10-CM | POA: Insufficient documentation

## 2023-05-08 DIAGNOSIS — Z880 Allergy status to penicillin: Secondary | ICD-10-CM | POA: Diagnosis not present

## 2023-05-08 DIAGNOSIS — K219 Gastro-esophageal reflux disease without esophagitis: Secondary | ICD-10-CM | POA: Diagnosis not present

## 2023-05-08 DIAGNOSIS — I484 Atypical atrial flutter: Secondary | ICD-10-CM | POA: Diagnosis present

## 2023-05-08 DIAGNOSIS — R002 Palpitations: Secondary | ICD-10-CM | POA: Diagnosis not present

## 2023-05-08 DIAGNOSIS — G4733 Obstructive sleep apnea (adult) (pediatric): Secondary | ICD-10-CM | POA: Diagnosis present

## 2023-05-08 DIAGNOSIS — Z91018 Allergy to other foods: Secondary | ICD-10-CM | POA: Diagnosis not present

## 2023-05-08 DIAGNOSIS — Z96641 Presence of right artificial hip joint: Secondary | ICD-10-CM | POA: Diagnosis not present

## 2023-05-08 DIAGNOSIS — D6869 Other thrombophilia: Secondary | ICD-10-CM

## 2023-05-08 DIAGNOSIS — Z7901 Long term (current) use of anticoagulants: Secondary | ICD-10-CM

## 2023-05-08 DIAGNOSIS — Z87442 Personal history of urinary calculi: Secondary | ICD-10-CM

## 2023-05-08 DIAGNOSIS — Z8616 Personal history of COVID-19: Secondary | ICD-10-CM

## 2023-05-08 DIAGNOSIS — Z882 Allergy status to sulfonamides status: Secondary | ICD-10-CM

## 2023-05-08 DIAGNOSIS — Z8719 Personal history of other diseases of the digestive system: Secondary | ICD-10-CM

## 2023-05-08 DIAGNOSIS — J45909 Unspecified asthma, uncomplicated: Secondary | ICD-10-CM | POA: Diagnosis present

## 2023-05-08 DIAGNOSIS — F4310 Post-traumatic stress disorder, unspecified: Secondary | ICD-10-CM | POA: Diagnosis present

## 2023-05-08 DIAGNOSIS — Z881 Allergy status to other antibiotic agents status: Secondary | ICD-10-CM

## 2023-05-08 DIAGNOSIS — Z888 Allergy status to other drugs, medicaments and biological substances status: Secondary | ICD-10-CM

## 2023-05-08 DIAGNOSIS — Z818 Family history of other mental and behavioral disorders: Secondary | ICD-10-CM

## 2023-05-08 DIAGNOSIS — Z885 Allergy status to narcotic agent status: Secondary | ICD-10-CM

## 2023-05-08 LAB — BASIC METABOLIC PANEL
Anion gap: 10 (ref 5–15)
Anion gap: 9 (ref 5–15)
BUN: 9 mg/dL (ref 8–23)
BUN: 7 mg/dL — ABNORMAL LOW (ref 8–23)
CO2: 23 mmol/L (ref 22–32)
CO2: 25 mmol/L (ref 22–32)
Calcium: 9.5 mg/dL (ref 8.9–10.3)
Calcium: 9.3 mg/dL (ref 8.9–10.3)
Chloride: 104 mmol/L (ref 98–111)
Chloride: 105 mmol/L (ref 98–111)
Creatinine, Ser: 0.72 mg/dL (ref 0.44–1.00)
Creatinine, Ser: 0.71 mg/dL (ref 0.44–1.00)
GFR, Estimated: >60 mL/min (ref >60)
GFR, Estimated: >60 mL/min (ref >60)
Glucose, Bld: 125 mg/dL — ABNORMAL HIGH (ref 70–99)
Glucose, Bld: 110 mg/dL — ABNORMAL HIGH (ref 70–99)
Potassium: 3.4 mmol/L — ABNORMAL LOW (ref 3.5–5.1)
Potassium: 4.7 mmol/L (ref 3.5–5.1)
Sodium: 137 mmol/L (ref 135–145)
Sodium: 139 mmol/L (ref 135–145)

## 2023-05-08 LAB — HIV ANTIBODY (ROUTINE TESTING W REFLEX): HIV Screen 4th Generation wRfx: NONREACTIVE

## 2023-05-08 LAB — MAGNESIUM: Magnesium: 2.1 mg/dL (ref 1.7–2.4)

## 2023-05-08 MED ORDER — APIXABAN 5 MG PO TABS
5.0000 mg | ORAL_TABLET | Freq: Two times a day (BID) | ORAL | Status: DC
  Administered 2023-05-08 – 2023-05-11 (×6): 5 mg via ORAL
  Filled 2023-05-08 (×6): qty 1

## 2023-05-08 MED ORDER — MORPHINE SULFATE 15 MG PO TABS
15.0000 mg | ORAL_TABLET | Freq: Four times a day (QID) | ORAL | Status: DC | PRN
  Administered 2023-05-08 – 2023-05-11 (×10): 15 mg via ORAL
  Filled 2023-05-08 (×10): qty 1

## 2023-05-08 MED ORDER — ACETAMINOPHEN 325 MG PO TABS
650.0000 mg | ORAL_TABLET | Freq: Four times a day (QID) | ORAL | Status: DC | PRN
  Administered 2023-05-10: 650 mg via ORAL
  Filled 2023-05-08: qty 2

## 2023-05-08 MED ORDER — SODIUM CHLORIDE 0.9% FLUSH
3.0000 mL | Freq: Two times a day (BID) | INTRAVENOUS | Status: DC
  Administered 2023-05-08 – 2023-05-11 (×4): 3 mL via INTRAVENOUS

## 2023-05-08 MED ORDER — DOFETILIDE 500 MCG PO CAPS
500.0000 ug | ORAL_CAPSULE | Freq: Two times a day (BID) | ORAL | Status: DC
  Administered 2023-05-08 – 2023-05-09 (×3): 500 ug via ORAL
  Filled 2023-05-08 (×3): qty 1

## 2023-05-08 MED ORDER — SODIUM CHLORIDE 0.9% FLUSH: 3.0000 mL | INTRAVENOUS | Status: DC | PRN

## 2023-05-08 MED ORDER — LINACLOTIDE 145 MCG PO CAPS
290.0000 ug | ORAL_CAPSULE | Freq: Every day | ORAL | Status: DC
  Administered 2023-05-09 – 2023-05-11 (×3): 290 ug via ORAL
  Filled 2023-05-08 (×3): qty 2
  Filled 2023-05-08: qty 1

## 2023-05-08 MED ORDER — DILTIAZEM HCL ER COATED BEADS 240 MG PO CP24
240.0000 mg | ORAL_CAPSULE | Freq: Every day | ORAL | Status: DC
  Administered 2023-05-09: 240 mg via ORAL
  Filled 2023-05-08: qty 1

## 2023-05-08 MED ORDER — POTASSIUM CHLORIDE CRYS ER 20 MEQ PO TBCR
60.0000 meq | EXTENDED_RELEASE_TABLET | ORAL | Status: AC
  Administered 2023-05-08: 60 meq via ORAL
  Filled 2023-05-08: qty 3

## 2023-05-08 MED ORDER — ALPRAZOLAM 0.5 MG PO TABS
1.0000 mg | ORAL_TABLET | Freq: Every evening | ORAL | Status: DC | PRN
  Administered 2023-05-09 – 2023-05-10 (×2): 1 mg via ORAL
  Filled 2023-05-08 (×2): qty 2

## 2023-05-08 MED ORDER — PANTOPRAZOLE SODIUM 40 MG PO TBEC
40.0000 mg | DELAYED_RELEASE_TABLET | Freq: Every day | ORAL | Status: DC
Start: 2023-05-09 — End: 2023-05-11
  Administered 2023-05-09 – 2023-05-11 (×3): 40 mg via ORAL
  Filled 2023-05-08 (×3): qty 1

## 2023-05-08 MED ORDER — POTASSIUM CHLORIDE CRYS ER 20 MEQ PO TBCR: 40.0000 meq | EXTENDED_RELEASE_TABLET | ORAL | Status: DC

## 2023-05-08 MED ORDER — DOFETILIDE 500 MCG PO CAPS: 500.0000 ug | ORAL_CAPSULE | Freq: Two times a day (BID) | ORAL | Status: DC

## 2023-05-08 MED ORDER — MONTELUKAST SODIUM 10 MG PO TABS
10.0000 mg | ORAL_TABLET | Freq: Every day | ORAL | Status: DC
  Administered 2023-05-08 – 2023-05-10 (×3): 10 mg via ORAL
  Filled 2023-05-08 (×3): qty 1

## 2023-05-08 MED ORDER — SODIUM CHLORIDE 0.9 % IV SOLN: 250.0000 mL | INTRAVENOUS | Status: AC | PRN

## 2023-05-08 NOTE — Addendum Note (Signed)
 Encounter addended by: Eustace Pen, PA-C on: 05/08/2023 10:21 AM  Actions taken: Clinical Note Signed

## 2023-05-08 NOTE — H&P (Addendum)
 Electrophysiology H&P  Note    Primary Care Physician: Lanier Prude, MD Primary Cardiologist: Dr Azucena Cecil Primary Electrophysiologist: Dr Graciela Husbands Referring Physician: Dr Oliva Bustard is a 63 y.o. female with a history of OSA, HTN, atrial flutter, atrial fibrillation who presents for follow up in the [facility]. Patient is on Eliquis for a CHADS2VASC score of 5. She had been maintained on amiodarone but underwent afib and flutter ablation on 08/06/20 in order to avoid the off target effects of amio.   On follow up today, patient reports she did have some post op CP which resolved with ibuprofen (phone note 08/18/20). She was inadvertently taking amlodipine instead of continuing amiodarone. She is now back on amiodarone. She has had a few episodes of heart racing since the ablation while off amiodarone.   On evaluation today, she is currently in NSR. S/p Afib and flutter ablation on 12/04/22 by Dr. Lalla Brothers. She notes to have brief paroxysmal episodes that last 10-15 minutes of Afib but overall NSR > Afib. She still feels a little tired and SOB when walking. No chest pain or trouble swallowing. Leg sites healed without issue. No missed doses of anticoagulant.  On follow up 05/08/23, patient is here for Tikosyn admission. No new medications since last OV. She stopped zofran after discussion last office visit. She takes Morphine 15 mg q 6 hr and has done so for years per patient. She takes xanax 1 mg at bedtime. No benadryl use. No missed doses of Eliquis 5 mg BID.   Today, she denies symptoms of orthopnea, PND, lower extremity edema, dizziness, presyncope, syncope, snoring, daytime somnolence, bleeding, or neurologic sequela. The patient is tolerating medications without difficulties and is otherwise without complaint today.    Atrial Fibrillation Risk Factors:  she does have symptoms or diagnosis of sleep apnea. she is compliant with CPAP  therapy. she does not have a history of rheumatic fever.   she has a BMI of Body mass index is 25.31 kg/m.Marland Kitchen Filed Weights   05/08/23 1134  Weight: 64.8 kg     Family History  Problem Relation Age of Onset   Aneurysm Mother    Bipolar disorder Sister    Aneurysm Maternal Grandmother    Breast cancer Paternal Grandmother 8   Bipolar disorder Grandchild    Anxiety disorder Grandchild    Depression Grandchild    Cancer Neg Hx     Atrial Fibrillation Management history:  Previous antiarrhythmic drugs: amiodarone Previous cardioversions: none Previous ablations: 08/06/20, 12/04/22 CHADS2VASC score: 5 Anticoagulation history: Eliquis   Past Medical History:  Diagnosis Date   Abnormal EKG    HX OF INVERTED T WAVES ON EKG, PALPITATIONS, CHEST PAINS-CARDIAC WORK UP DID NOT SHOW ANY HEART DISEASE   AC (acromioclavicular) joint bone spurs    Acute postoperative pain 01/04/2017   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    Chronic headaches    Chronic, continuous use of opioids    DDD (degenerative disc disease)    CERVICAL AND LUMBAR-CHRONIC PAIN, RT HIP LABRAL TEAR   Depression    PT STATES A LOT OF STRESS IN HER LIFE   Dissociative disorder    Dizziness 04/22/2013   Duodenal ulcer with hemorrhage and perforation (HCC) 04/27/2003   Foot drop, right  FROM BACK SURGERY   GERD (gastroesophageal reflux disease)    H/O arthrodesis 08/18/2014   Headache(784.0)    AND NECK PAIN--STATES RECENT TEST SHOW CERVICAL DEGENERATION   History of blood transfusion    s/p back surgery   History of cardiac cath    a. 05/2019 Cath: Nl cors. EF 55-65%.   History of cardioversion    History of cervical spinal surgery 01/04/2015   History of kidney stones    Hypertension    Inverted T wave    Iron deficiency 02/01/2021   Mitral regurgitation    a. 07/2020  Echo: EF 60-65%, no rwma. Nl RV size/fxn. RVSP 39.54mmHg. Mildly to mod dil LA. Mod MR; b. 07/2020 TEE: EF 55-60%. Lambl's excresence. Nl RV size/fxn. Midly dil LA. No LA/LAA thrombus. Mild MR.   Narrowing of intervertebral disc space 08/18/2014   Currently on disability.    Orthostatic hypotension 04/22/2013   Pain    CHRONIC NECK AND BACK PAIN - LIMITED ROM NECK - S/P FUSIONS CERVICAL AND LUMBAR   Paroxysmal atrial fibrillation (HCC) 2021   a. 07/2020 s/p PVI.   Pneumonia    PONV (postoperative nausea and vomiting)    PT GIVES HX OF N&V AND FEVER WITH SURGERIES YEARS AGO--BUT NO PROBLEMS WITH MORE RECENT SURGERIES--STATES NOT MALIGNANT HYPERTHERMIA   Postop Hyponatremia 05/14/2012   Postoperative anemia due to acute blood loss 05/14/2012   PTSD (post-traumatic stress disorder)    Right foot drop    Right hip arthralgia 08/18/2014   Status post surgery of right, and now needs left.    Sleep apnea 2021   does not have a cpap   Therapeutic opioid-induced constipation (OIC)    Typical atrial flutter [facility])    Past Surgical History:  Procedure Laterality Date   ABDOMINAL HYSTERECTOMY     afib  05/2019   ANTERIOR CERVICAL DECOMP/DISCECTOMY FUSION N/A 10/30/2012   Procedure: ACDF C5-6, EXPLORATION AND HARDWARE REMOVAL C6-7;  Surgeon: Venita Lick, MD;  Location: [facility] OR;  Service: Orthopedics;  Laterality: N/A;   ANTERIOR FUSION CERVICAL SPINE  MAY 2012   AT [facility]   ATRIAL FIBRILLATION ABLATION N/A 08/06/2020   Procedure: ATRIAL FIBRILLATION ABLATION;  Surgeon: Lanier Prude, MD;  Location: [facility] INVASIVE CV LAB;  Service: Cardiovascular;  Laterality: N/A;   ATRIAL FIBRILLATION ABLATION N/A 12/04/2022   Procedure: ATRIAL FIBRILLATION ABLATION;  Surgeon: Lanier Prude, MD;  Location: [facility] CV LAB;  Service: Cardiovascular;  Laterality: N/A;   BACK SURGERY  2009   LUMBAR FUSION WITH RODS    BREAST BIOPSY Right 2008   benign.- bx/clip   BREAST EXCISIONAL BIOPSY Left 1998   benign    BREAST REDUCTION SURGERY Bilateral 06/2016   CARDIAC ELECTROPHYSIOLOGY MAPPING AND ABLATION     CARDIOVERSION N/A 12/03/2019   Procedure: CARDIOVERSION;  Surgeon: Debbe Odea, MD;  Location: [facility] ORS;  Service: Cardiovascular;  Laterality: N/A;   CARDIOVERSION N/A 08/01/2021   Procedure: CARDIOVERSION;  Surgeon: Debbe Odea, MD;  Location: [facility] ORS;  Service: Cardiovascular;  Laterality: N/A;   CARDIOVERSION N/A 08/05/2021   Procedure: CARDIOVERSION;  Surgeon: Antonieta Iba, MD;  Location: [facility] ORS;  Service: Cardiovascular;  Laterality: N/A;   CARPAL TUNNEL RELEASE  05-06-12   Right   CHOLECYSTECTOMY     COLONOSCOPY WITH PROPOFOL N/A 01/26/2017   Procedure: COLONOSCOPY WITH PROPOFOL;  Surgeon: Midge Minium, MD;  Location: [facility] SURGERY CNTR;  Service: Endoscopy;  Laterality: N/A;   COLONOSCOPY WITH PROPOFOL N/A 01/16/2021  Procedure: COLONOSCOPY WITH PROPOFOL;  Surgeon: Toney Reil, MD;  Location: [facility] ENDOSCOPY;  Service: Gastroenterology;  Laterality: N/A;   DIAGNOSTIC LAPAROSCOPIES - MULTIPLE FOR ENDOMETRIOSIS     ESOPHAGEAL DILATION N/A 01/26/2017   Procedure: ESOPHAGEAL DILATION;  Surgeon: Midge Minium, MD;  Location: [facility] SURGERY CNTR;  Service: Endoscopy;  Laterality: N/A;   ESOPHAGEAL MANOMETRY N/A 05/30/2017   Procedure: ESOPHAGEAL MANOMETRY (EM);  Surgeon: Midge Minium, MD;  Location: [facility] ENDOSCOPY;  Service: Endoscopy;  Laterality: N/A;   ESOPHAGOGASTRODUODENOSCOPY (EGD) WITH PROPOFOL N/A 01/26/2017   Procedure: ESOPHAGOGASTRODUODENOSCOPY (EGD) WITH PROPOFOL;  Surgeon: Midge Minium, MD;  Location: [facility] SUR[facility]ERY [facility];  Service: Endoscopy;  Laterality: N/A;   ESOPHAGOGASTRODUODENOSCOPY (EGD) WITH PROPOFOL N/A 01/30/2020   Procedure: ESOPHAGOGASTRODUODENOSCOPY (EGD) WITH PROPOFOL;  Surgeon: Midge Minium, MD;  Location: [facility] SURGERY CNTR;  Service: Endoscopy;  Laterality: N/A;  sleep apnea COVID + 01-15-20   ESOPHAGOGASTRODUODENOSCOPY (EGD) WITH  PROPOFOL N/A 01/15/2021   Procedure: ESOPHAGOGASTRODUODENOSCOPY (EGD) WITH PROPOFOL;  Surgeon: Toney Reil, MD;  Location: [facility] ENDOSCOPY;  Service: Gastroenterology;  Laterality: N/A;   ESOPHAGOGASTRODUODENOSCOPY (EGD) WITH PROPOFOL N/A 12/07/2021   Procedure: ESOPHAGOGASTRODUODENOSCOPY (EGD) WITH PROPOFOL;  Surgeon: Toney Reil, MD;  Location: [facility] ENDOSCOPY;  Service: Gastroenterology;  Laterality: N/A;   HIP ARTHROSCOPY  09/20/2011   Procedure: ARTHROSCOPY HIP;  Surgeon: Loanne Drilling, MD;  Location: [facility] ORS;  Service: Orthopedics;  Laterality: Right;  Right Hip Scope with Labral Debridement   LEFT HEART CATH AND CORONARY ANGIOGRAPHY Left 06/10/2019   Procedure: LEFT HEART CATH AND CORONARY ANGIOGRAPHY;  Surgeon: Yvonne Kendall, MD;  Location: [facility] INVASIVE CV LAB;  Service: Cardiovascular;  Laterality: Left;   LOWER EXTREMITY ANGIOGRAPHY Left 01/06/2022   Procedure: Lower Extremity Angiography;  Surgeon: Renford Dills, MD;  Location: [facility]VAS[facility] CV LAB;  Service: Cardiovascular;  Laterality: Left;   NASAL SEPTUM SURGERY  MARCH 2013   IN [place]   [facility] IMPEDANCE STUDY N/A 05/30/2017   Procedure: PH IMPEDANCE STUDY;  Surgeon: Midge Minium, MD;  Location: [facility] ENDOSCOPY;  Service: Endoscopy;  Laterality: N/A;   POLYPECTOMY N/A 01/26/2017   Procedure: POLYPECTOMY;  Surgeon: Midge Minium, MD;  Location: [facility] SUR[facility]ERY [facility];  Service: Endoscopy;  Laterality: N/A;   POSTERIOR CERVICAL FUSION/FORAMINOTOMY N/A 04/16/2013   Procedure: REMOVAL CERVICAL PLATES AND INTERBODY CAGE/POSTERIOR CERVICAL SPINAL FUSION C4 - C6/C5 CORPECTOMY/C4 - C6 FUSION WITH ILIAC CREST BONE GRAFT;  Surgeon: Venita Lick, MD;  Location: [facility] OR;  Service: Orthopedics;  Laterality: N/A;   RADIOFREQUENCY ABLATION NERVES     REDUCTION MAMMAPLASTY Bilateral 05/2016   RIGHT HIP ARTHROSCOPY FOR LABRAL TEAR  ABOUT 2010   2012 also   SHOULDER ARTHROSCOPY  05-06-12   bone spur   TEE WITHOUT CARDIOVERSION N/A  08/05/2020   Procedure: TRANSESOPHAGEAL ECHOCARDIOGRAM (TEE);  Surgeon: Lewayne Bunting, MD;  Location: [facility] ENDOSCOPY;  Service: Cardiovascular;  Laterality: N/A;   TONSILLECTOMY     AS A CHILD   TOTAL HIP ARTHROPLASTY Right 05/13/2012   Procedure: TOTAL HIP ARTHROPLASTY ANTERIOR APPROACH;  Surgeon: Loanne Drilling, MD;  Location: [facility] ORS;  Service: Orthopedics;  Laterality: Right;    Current Facility-Administered Medications  Medication Dose Route Frequency Provider Last Rate Last Admin   0.9 %  sodium chloride infusion  250 mL Intravenous PRN Prather Failla Daphine Deutscher, MD       acetaminophen (TYLENOL) tablet 650 mg  650 mg Oral Q6H PRN Eustace Pen, PA-C       apixaban Everlene Balls) tablet 5  mg  5 mg Oral BID Regan Lemming, MD       [START ON 05/09/2023] diltiazem (CARDIZEM CD) 24 hr capsule 240 mg  240 mg Oral Daily Eustace Pen, PA-C       dofetilide (TIKOSYN) capsule 500 mcg  500 mcg Oral BID Regan Lemming, MD       Melene Muller ON 05/09/2023] linaclotide (LINZESS) capsule 290 mcg  290 mcg Oral QAC breakfast Eustace Pen, PA-C       montelukast (SINGULAIR) tablet 10 mg  10 mg Oral Daily Eustace Pen, PA-C       [START ON 05/09/2023] pantoprazole (PROTONIX) EC tablet 40 mg  40 mg Oral QAC breakfast Eustace Pen, PA-C       potassium chloride SA (KLOR-CON M) CR tablet 60 mEq  60 mEq Oral STAT Mosetta Anis, RPH       sodium chloride flush (NS) 0.9 % injection 3 mL  3 mL Intravenous Q12H Emmah Bratcher Daphine Deutscher, MD       sodium chloride flush (NS) 0.9 % injection 3 mL  3 mL Intravenous PRN Anuradha Chabot, Andree Coss, MD        Allergies  Allergen Reactions   Amoxicillin Hives    She did ok w ANCEF   Chlorhexidine Gluconate Dermatitis and Hives   Clindamycin Hives   Codeine Hives   Erythromycin Hives    "mycins" in general   Penicillin G Hives    "cillins" in general   Sulfa Antibiotics Nausea And Vomiting and Hives        Shellfish Allergy Hives   Decadron  [Dexamethasone] Other (See Comments)    Hot flashes, insomnia, "manic"    Flecainide Nausea And Vomiting    Pts said she signs of going into heart failure. Adema   Papaya Derivatives Hives   Betadine [Povidone Iodine] Hives        Other Hives     Mango    Prednisone Anxiety    High blood pressure, flushed, mood changes, heart palpitations    ROS- All systems are reviewed and negative except as per the HPI above.  Physical Exam: Vitals:   05/08/23 1134  BP: 101/64  Temp: 97.6 F (36.4 C)  TempSrc: Oral  Weight: 64.8 kg  Height: 5\' 3"  (1.6 m)    GEN- The patient is well appearing, alert and oriented x 3 today.   Neck - no JVD or carotid bruit noted Lungs- Clear to ausculation bilaterally, normal work of breathing Heart- Regular rate and rhythm, no murmurs, rubs or gallops, PMI not laterally displaced Extremities- no clubbing, cyanosis, or edema Skin - no rash or ecchymosis noted   Wt Readings from Last 3 Encounters:  05/08/23 64.8 kg  05/08/23 64.8 kg  04/30/23 65.7 kg    EKG today demonstrates  Vent. rate 70 BPM PR interval 132 ms QRS duration 82 ms QT/QTcB 382/412 ms P-R-T axes 0 51 -51 Normal sinus rhythm ST & T wave abnormality, consider lateral ischemia Abnormal ECG When compared with ECG of 30-Apr-2023 12:56, PREVIOUS ECG IS PRESENT  Echo 07/31/21: 1. Left ventricular ejection fraction, by estimation, is 55 to 60%. Left  ventricular ejection fraction by 2D MOD biplane is 55.3 %. The left  ventricle has normal function. The left ventricle has no regional wall  motion abnormalities. There is mild left  ventricular hypertrophy. Left ventricular diastolic parameters are  indeterminate.   2. Right ventricular systolic function is normal. The right ventricular  size  is normal.   3. The mitral valve is normal in structure. Trivial mitral valve  regurgitation.   4. The aortic valve is tricuspid. Aortic valve regurgitation is trivial.    Epic records are  reviewed at length today  CHA2DS2-VASc Score = 3  The patient's score is based upon: CHF History: 0 HTN History: 1 Diabetes History: 0 Stroke History: 0 Vascular Disease History: 1 Age Score: 0 Gender Score: 1     ASSESSMENT AND PLAN: Persistent Atrial Fibrillation/atrial flutter (typical and atypical) The patient's CHA2DS2-VASc score is 3, indicating a 3.2% annual risk of stroke.   S/p afib and flutter ablation 08/06/20 S/p Afib and flutter ablation on 12/04/22 by Dr. Lalla Brothers.   Patient presents for dofetilide admission. Continue Eliquis 5 mg BID, states no missed doses in the last 3 weeks. No recent benadryl use PharmD has screened medications QTc in SR 413 ms. Addendum: Labs show K 3.4, creatinine 0.72, mag 2.1. CrCl 82 mL/min.    HTN Stable today.  Obstructive sleep apnea She is compliant with CPAP.    Hypokalemia K 3.4 on arrival.  Daksha Koone plan to aggressively supp and delay first dose til late this evening per protocol  [employer]" [place], PA-C  05/08/2023 12:25 PM   I have seen and examined this patient with Otilio Saber.  Agree with above, note added to reflect my findings.  She has a history of sleep apnea, hypertension, atrial fibrillation/flutter.  She is post ablation x 3.  She is continue to have episodes of atrial fibrillation.  She was presents to the hospital for dofetilide load.  She is currently feeling well.  She is in sinus rhythm today.  GEN: Well nourished, well developed, in no acute distress  HEENT: normal  Neck: no JVD, carotid bruits, or masses Cardiac: RRR; no murmurs, rubs, or gallops,no edema  Respiratory:  clear to auscultation bilaterally, normal work of breathing GI: soft, nontender, nondistended, + BS MS: no deformity or atrophy  Skin: warm and dry Neuro:  Strength and sensation are intact Psych: euthymic mood, full affect   Persistent atrial fibrillation/atrial flutter: Has had both typical and atypical atrial flutter.  She is post  ablation x 3 per patient.  She presents for dofetilide load due to continued palpitations.  Potassium is low.  Amyjo Mizrachi supplement today.  QTc acceptable.  Sherly Brodbeck plan for 500 mcg twice daily.  Margretta Zamorano check EKG 2 hours after each dose. Hypertension: Stable Obstructive sleep apnea: CPAP compliance encouraged Hypokalemia: 3.4 on arrival.  [employer] supplement per pharmacy recommendations.  If she continues to have hypokalemia, Aldactone may be a reasonable option.  Shayan Bramhall M. Tesneem Dufrane MD 05/08/2023 3:21 PM

## 2023-05-08 NOTE — Progress Notes (Addendum)
 Primary Care Physician: Lanier Prude, MD Primary Cardiologist: Dr Azucena Cecil Primary Electrophysiologist: Dr Graciela Husbands Referring Physician: Dr Oliva Bustard is a 63 y.o. female with a history of OSA, HTN, atrial flutter, atrial fibrillation who presents for follow up in the [facility]. Patient is on Eliquis for a CHADS2VASC score of 5. She had been maintained on amiodarone but underwent afib and flutter ablation on 08/06/20 in order to avoid the off target effects of amio.   On follow up today, patient reports she did have some post op CP which resolved with ibuprofen (phone note 08/18/20). She was inadvertently taking amlodipine instead of continuing amiodarone. She is now back on amiodarone. She has had a few episodes of heart racing since the ablation while off amiodarone.   On evaluation today, she is currently in NSR. S/p Afib and flutter ablation on 12/04/22 by Dr. Lalla Brothers. She notes to have brief paroxysmal episodes that last 10-15 minutes of Afib but overall NSR > Afib. She still feels a little tired and SOB when walking. No chest pain or trouble swallowing. Leg sites healed without issue. No missed doses of anticoagulant.  On follow up 05/08/23, patient is here for Tikosyn admission. No new medications since last OV. She stopped zofran after discussion last office visit. She takes Morphine 15 mg q 6 hr and has done so for years per patient. She takes xanax 1 mg at bedtime. No benadryl use. No missed doses of Eliquis 5 mg BID.   Today, she denies symptoms of orthopnea, PND, lower extremity edema, dizziness, presyncope, syncope, snoring, daytime somnolence, bleeding, or neurologic sequela. The patient is tolerating medications without difficulties and is otherwise without complaint today.    Atrial Fibrillation Risk Factors:  she does have symptoms or diagnosis of sleep apnea. she is compliant with CPAP therapy. she does not have a history of  rheumatic fever.   she has a BMI of Body mass index is 25.3 kg/m.Marland Kitchen Filed Weights   05/08/23 0834  Weight: 64.8 kg     Family History  Problem Relation Age of Onset   Aneurysm Mother    Bipolar disorder Sister    Aneurysm Maternal Grandmother    Breast cancer Paternal Grandmother 98   Bipolar disorder Grandchild    Anxiety disorder Grandchild    Depression Grandchild    Cancer Neg Hx     Atrial Fibrillation Management history:  Previous antiarrhythmic drugs: amiodarone Previous cardioversions: none Previous ablations: 08/06/20, 12/04/22 CHADS2VASC score: 5 Anticoagulation history: Eliquis   Past Medical History:  Diagnosis Date   Abnormal EKG    HX OF INVERTED T WAVES ON EKG, PALPITATIONS, CHEST PAINS-CARDIAC WORK UP DID NOT SHOW ANY HEART DISEASE   AC (acromioclavicular) joint bone spurs    Acute postoperative pain 01/04/2017   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    Chronic headaches    Chronic, continuous use of opioids    DDD (degenerative disc disease)    CERVICAL AND LUMBAR-CHRONIC PAIN, RT HIP LABRAL TEAR   Depression    PT STATES A LOT OF STRESS IN HER LIFE   Dissociative disorder    Dizziness 04/22/2013   Duodenal ulcer with hemorrhage and perforation (HCC) 04/27/2003   Foot drop, right    FROM BACK SURGERY  GERD (gastroesophageal reflux disease)    H/O arthrodesis 08/18/2014   Headache(784.0)    AND NECK PAIN--STATES RECENT TEST SHOW CERVICAL DEGENERATION   History of blood transfusion    s/p back surgery   History of cardiac cath    a. 05/2019 Cath: Nl cors. EF 55-65%.   History of cardioversion    History of cervical spinal surgery 01/04/2015   History of kidney stones    Hypertension    Inverted T wave    Iron deficiency 02/01/2021   Mitral regurgitation    a. 07/2020 Echo: EF 60-65%, no rwma. Nl RV size/fxn.  RVSP 39.107mmHg. Mildly to mod dil LA. Mod MR; b. 07/2020 TEE: EF 55-60%. Lambl's excresence. Nl RV size/fxn. Midly dil LA. No LA/LAA thrombus. Mild MR.   Narrowing of intervertebral disc space 08/18/2014   Currently on disability.    Orthostatic hypotension 04/22/2013   Pain    CHRONIC NECK AND BACK PAIN - LIMITED ROM NECK - S/P FUSIONS CERVICAL AND LUMBAR   Paroxysmal atrial fibrillation (HCC) 2021   a. 07/2020 s/p PVI.   Pneumonia    PONV (postoperative nausea and vomiting)    PT GIVES HX OF N&V AND FEVER WITH SURGERIES YEARS AGO--BUT NO PROBLEMS WITH MORE RECENT SURGERIES--STATES NOT MALIGNANT HYPERTHERMIA   Postop Hyponatremia 05/14/2012   Postoperative anemia due to acute blood loss 05/14/2012   PTSD (post-traumatic stress disorder)    Right foot drop    Right hip arthralgia 08/18/2014   Status post surgery of right, and now needs left.    Sleep apnea 2021   does not have a cpap   Therapeutic opioid-induced constipation (OIC)    Typical atrial flutter [facility])    Past Surgical History:  Procedure Laterality Date   ABDOMINAL HYSTERECTOMY     afib  05/2019   ANTERIOR CERVICAL DECOMP/DISCECTOMY FUSION N/A 10/30/2012   Procedure: ACDF C5-6, EXPLORATION AND HARDWARE REMOVAL C6-7;  Surgeon: Venita Lick, MD;  Location: [facility] OR;  Service: Orthopedics;  Laterality: N/A;   ANTERIOR FUSION CERVICAL SPINE  MAY 2012   AT [facility]   ATRIAL FIBRILLATION ABLATION N/A 08/06/2020   Procedure: ATRIAL FIBRILLATION ABLATION;  Surgeon: Lanier Prude, MD;  Location: [facility] INVASIVE CV LAB;  Service: Cardiovascular;  Laterality: N/A;   ATRIAL FIBRILLATION ABLATION N/A 12/04/2022   Procedure: ATRIAL FIBRILLATION ABLATION;  Surgeon: Lanier Prude, MD;  Location: [facility] INVASIVE CV LAB;  Service: Cardiovascular;  Laterality: N/A;   BACK SURGERY  2009   LUMBAR FUSION WITH RODS    BREAST BIOPSY Right 2008   benign.- bx/clip   BREAST EXCISIONAL BIOPSY Left 1998   benign   BREAST REDUCTION SURGERY Bilateral  06/2016   CARDIAC ELECTROPHYSIOLOGY MAPPING AND ABLATION     CARDIOVERSION N/A 12/03/2019   Procedure: CARDIOVERSION;  Surgeon: Debbe Odea, MD;  Location: [facility] ORS;  Service: Cardiovascular;  Laterality: N/A;   CARDIOVERSION N/A 08/01/2021   Procedure: CARDIOVERSION;  Surgeon: Debbe Odea, MD;  Location: [facility] ORS;  Service: Cardiovascular;  Laterality: N/A;   CARDIOVERSION N/A 08/05/2021   Procedure: CARDIOVERSION;  Surgeon: Antonieta Iba, MD;  Location: [facility] ORS;  Service: Cardiovascular;  Laterality: N/A;   CARPAL TUNNEL RELEASE  05-06-12   Right   CHOLECYSTECTOMY     COLONOSCOPY WITH PROPOFOL N/A 01/26/2017   Procedure: COLONOSCOPY WITH PROPOFOL;  Surgeon: Midge Minium, MD;  Location: [facility];  Service: Endoscopy;  Laterality: N/A;   COLONOSCOPY WITH PROPOFOL N/A 01/16/2021   Procedure: COLONOSCOPY WITH PROPOFOL;  Surgeon: Toney Reil, MD;  Location: [facility] ENDOSCOPY;  Service: Gastroenterology;  Laterality: N/A;   DIAGNOSTIC LAPAROSCOPIES - MULTIPLE FOR ENDOMETRIOSIS     ESOPHAGEAL DILATION N/A 01/26/2017   Procedure: ESOPHAGEAL DILATION;  Surgeon: Midge Minium, MD;  Location: [facility];  Service: Endoscopy;  Laterality: N/A;   ESOPHAGEAL MANOMETRY N/A 05/30/2017   Procedure: ESOPHAGEAL MANOMETRY (EM);  Surgeon: Midge Minium, MD;  Location: [facility] ENDOSCOPY;  Service: Endoscopy;  Laterality: N/A;   ESOPHAGOGASTRODUODENOSCOPY (EGD) WITH PROPOFOL N/A 01/26/2017   Procedure: ESOPHAGOGASTRODUODENOSCOPY (EGD) WITH PROPOFOL;  Surgeon: Midge Minium, MD;  Location: [facility];  Service: Endoscopy;  Laterality: N/A;   ESOPHAGOGASTRODUODENOSCOPY (EGD) WITH PROPOFOL N/A 01/30/2020   Procedure: ESOPHAGOGASTRODUODENOSCOPY (EGD) WITH PROPOFOL;  Surgeon: Midge Minium, MD;  Location: [facility];  Service: Endoscopy;  Laterality: N/A;  sleep apnea COVID + 01-15-20   ESOPHAGOGASTRODUODENOSCOPY (EGD) WITH PROPOFOL N/A 01/15/2021   Procedure:  ESOPHAGOGASTRODUODENOSCOPY (EGD) WITH PROPOFOL;  Surgeon: Toney Reil, MD;  Location: [facility] ENDOSCOPY;  Service: Gastroenterology;  Laterality: N/A;   ESOPHAGOGASTRODUODENOSCOPY (EGD) WITH PROPOFOL N/A 12/07/2021   Procedure: ESOPHAGOGASTRODUODENOSCOPY (EGD) WITH PROPOFOL;  Surgeon: Toney Reil, MD;  Location: [facility] ENDOSCOPY;  Service: Gastroenterology;  Laterality: N/A;   HIP ARTHROSCOPY  09/20/2011   Procedure: ARTHROSCOPY HIP;  Surgeon: Loanne Drilling, MD;  Location: [facility] ORS;  Service: Orthopedics;  Laterality: Right;  Right Hip Scope with Labral Debridement   LEFT HEART CATH AND CORONARY ANGIOGRAPHY Left 06/10/2019   Procedure: LEFT HEART CATH AND CORONARY ANGIOGRAPHY;  Surgeon: Yvonne Kendall, MD;  Location: [facility] INVASIVE CV LAB;  Service: Cardiovascular;  Laterality: Left;   LOWER EXTREMITY ANGIOGRAPHY Left 01/06/2022   Procedure: Lower Extremity Angiography;  Surgeon: Renford Dills, MD;  Location: [facility] INVASIVE CV LAB;  Service: Cardiovascular;  Laterality: Left;   NASAL SEPTUM SURGERY  MARCH 2013   IN [place]   [facility] IMPEDANCE STUDY N/A 05/30/2017   Procedure: PH IMPEDANCE STUDY;  Surgeon: Midge Minium, MD;  Location: [facility] ENDOSCOPY;  Service: Endoscopy;  Laterality: N/A;   POLYPECTOMY N/A 01/26/2017   Procedure: POLYPECTOMY;  Surgeon: Midge Minium, MD;  Location: [facility] SUR[facility]ERY [facility];  Service: Endoscopy;  Laterality: N/A;   POSTERIOR CERVICAL FUSION/FORAMINOTOMY N/A 04/16/2013   Procedure: REMOVAL CERVICAL PLATES AND INTERBODY CAGE/POSTERIOR CERVICAL SPINAL FUSION C4 - C6/C5 CORPECTOMY/C4 - C6 FUSION WITH ILIAC CREST BONE GRAFT;  Surgeon: Venita Lick, MD;  Location: [facility] OR;  Service: Orthopedics;  Laterality: N/A;   RADIOFREQUENCY ABLATION NERVES     REDUCTION MAMMAPLASTY Bilateral 05/2016   RIGHT HIP ARTHROSCOPY FOR LABRAL TEAR  ABOUT 2010   2012 also   SHOULDER ARTHROSCOPY  05-06-12   bone spur   TEE WITHOUT CARDIOVERSION N/A 08/05/2020   Procedure: TRANSESOPHAGEAL  ECHOCARDIOGRAM (TEE);  Surgeon: Lewayne Bunting, MD;  Location: [facility] ENDOSCOPY;  Service: Cardiovascular;  Laterality: N/A;   TONSILLECTOMY     AS A CHILD   TOTAL HIP ARTHROPLASTY Right 05/13/2012   Procedure: TOTAL HIP ARTHROPLASTY ANTERIOR APPROACH;  Surgeon: Loanne Drilling, MD;  Location: [facility] ORS;  Service: Orthopedics;  Laterality: Right;    Current Outpatient Medications  Medication Sig Dispense Refill   acetaminophen (TYLENOL) 325 MG tablet Take 2 tablets (650 mg total) by mouth every 6 (six) hours as needed for mild pain (or Fever >/= 101). (Patient taking differently: Take 1,000 mg by mouth as needed for mild pain (pain score 1-3) (or Fever >/= 101).)     ALPRAZolam (XANAX) 1 MG tablet Take  1 mg by mouth at bedtime.      cholecalciferol (VITAMIN D3) 25 MCG (1000 UNIT) tablet Take 1,000 Units by mouth daily.     diltiazem (CARDIZEM CD) 240 MG 24 hr capsule Take 240 mg by mouth daily.     diltiazem (CARDIZEM) 30 MG tablet Take 1 tablet (30 mg total) by mouth daily as needed (elevated bp or hr above 120). 30 tablet 0   ELIQUIS 5 MG TABS tablet TAKE 1 TABLET BY MOUTH TWICE A DAY 60 tablet 5   linaclotide (LINZESS) 290 MCG CAPS capsule Take 1 capsule (290 mcg total) by mouth daily before breakfast. 30 capsule 5   Mometasone Furoate (ALLERGY NASAL SPRAY NA) Place 1 spray into the nose daily as needed (allergies/ decongestion).     montelukast (SINGULAIR) 10 MG tablet TAKE 1 TABLET BY MOUTH EVERY DAY 90 tablet 1   [START ON 05/20/2023] morphine (MSIR) 15 MG tablet Take 1 tablet (15 mg total) by mouth every 6 (six) hours as needed for moderate pain (pain score 4-6) or severe pain (pain score 7-10). Must last 30 days. 120 tablet 0   naloxone (NARCAN) nasal spray 4 mg/0.1 mL Place 1 spray into the nose as needed for up to 365 doses (for opioid-induced respiratory depresssion). In case of emergency (overdose), spray once into each nostril. If no response within 3 minutes, repeat application and call 911.  1 each 0   pantoprazole (PROTONIX) 40 MG tablet Take 1 tablet (40 mg total) by mouth daily before breakfast. 90 tablet 3   valACYclovir (VALTREX) 1000 MG tablet TAKE TWO TABLETS BY MOUTH TWICE DAILY FOR ONE DAY FEVER BLISTERTAKE TWO TABLETS BY MOUTH TWICE DAILY FOR ONE DAY FEVER BLISTER (Patient taking differently: TAKE TWO TABLETS BY MOUTH TWICE DAILY FOR ONE DAY FEVER BLISTERTAKE TWO TABLETS BY MOUTH as neededFEVER BLISTER) 4 tablet 5   No current facility-administered medications for this encounter.    Allergies  Allergen Reactions   Amoxicillin Hives    She did ok w ANCEF   Chlorhexidine Gluconate Dermatitis and Hives   Clindamycin Hives   Codeine Hives   Erythromycin Hives    "mycins" in general   Penicillin G Hives    "cillins" in general   Sulfa Antibiotics Nausea And Vomiting and Hives        Levofloxacin Hives   Shellfish Allergy Hives   Decadron [Dexamethasone] Other (See Comments)    Hot flashes, insomnia, "manic"    Flecainide Swelling    Pts said she signs of going into heart failure. Adema   Mangifera Indica Hives    papaya   Papaya Derivatives Hives   Betadine [Povidone Iodine] Hives        Clarithromycin Hives   Other Hives     Mango    Povidone-Iodine Hives   Prednisone Anxiety    High blood pressure, flushed, mood changes, heart palpitations    ROS- All systems are reviewed and negative except as per the HPI above.  Physical Exam: Vitals:   05/08/23 0834  BP: 130/82  Pulse: 70  Weight: 64.8 kg  Height: 5\' 3"  (1.6 m)    GEN- The patient is well appearing, alert and oriented x 3 today.   Neck - no JVD or carotid bruit noted Lungs- Clear to ausculation bilaterally, normal work of breathing Heart- Regular rate and rhythm, no murmurs, rubs or gallops, PMI not laterally displaced Extremities- no clubbing, cyanosis, or edema Skin - no rash or ecchymosis noted  Wt Readings from Last 3 Encounters:  05/08/23 64.8 kg  04/30/23 65.7 kg  04/25/23  65.4 kg    EKG today demonstrates  Vent. rate 70 BPM PR interval 132 ms QRS duration 82 ms QT/QTcB 382/412 ms P-R-T axes 0 51 -51 Normal sinus rhythm ST & T wave abnormality, consider lateral ischemia Abnormal ECG When compared with ECG of 30-Apr-2023 12:56, PREVIOUS ECG IS PRESENT  Echo 07/31/21: 1. Left ventricular ejection fraction, by estimation, is 55 to 60%. Left  ventricular ejection fraction by 2D MOD biplane is 55.3 %. The left  ventricle has normal function. The left ventricle has no regional wall  motion abnormalities. There is mild left  ventricular hypertrophy. Left ventricular diastolic parameters are  indeterminate.   2. Right ventricular systolic function is normal. The right ventricular  size is normal.   3. The mitral valve is normal in structure. Trivial mitral valve  regurgitation.   4. The aortic valve is tricuspid. Aortic valve regurgitation is trivial.    Epic records are reviewed at length today  CHA2DS2-VASc Score = 3  The patient's score is based upon: CHF History: 0 HTN History: 1 Diabetes History: 0 Stroke History: 0 Vascular Disease History: 1 Age Score: 0 Gender Score: 1     ASSESSMENT AND PLAN: 1. Persistent Atrial Fibrillation/atrial flutter (typical and atypical) The patient's CHA2DS2-VASc score is 3, indicating a 3.2% annual risk of stroke.   S/p afib and flutter ablation 08/06/20 S/p Afib and flutter ablation on 12/04/22 by Dr. Lalla Brothers.   Patient presents for dofetilide admission. Continue Eliquis 5 mg BID, states no missed doses in the last 3 weeks. No recent benadryl use PharmD has screened medications QTc in SR 413 ms. Addendum: Labs show K 3.4, creatinine 0.72, mag 2.1. CrCl 82 mL/min.    2. HTN Stable today.  3. Obstructive sleep apnea She is compliant with CPAP.    Patient will present to admissions when a bed is available.    Justin Mend, PA-C [facility] [address] [place], [place] [address] [phone] 05/08/2023 9:32 AM

## 2023-05-08 NOTE — Progress Notes (Signed)
 RN paged Cardiology regarding pt request for 1mg  xanax that she takes at home for sleep. Provider placed order.

## 2023-05-08 NOTE — Addendum Note (Signed)
 Encounter addended by: Eustace Pen, PA-C on: 05/08/2023 9:45 AM  Actions taken: Clinical Note Signed

## 2023-05-08 NOTE — TOC CM/SW Note (Signed)
 Transition of Care [facility]) - Inpatient Brief Assessment   Patient Details  Name: [name] MRN: [id number] Date of Birth: [birth year]  Transition of Care [facility]) CM/SW Contact:    Gala Lewandowsky, RN Phone Number: 05/08/2023, 2:09 PM   Clinical Narrative: Patient presented for Tikosyn Load. Case Manager spoke with the patient regarding co pay cost. Patient is agreeable to cost and would like to have the initial Rx filled via [facility] and the Rx refills 90 day supply escribed to [facility] [place], [place]. No further needs identified at this time.    Transition of Care Asessment: Insurance and Status: Insurance coverage has been reviewed Patient has primary care physician: Yes Home environment has been reviewed: reviewed Prior level of function:: independent Prior/Current Home Services: No current home services Social Drivers of Health Review: SDOH reviewed no interventions necessary Readmission risk has been reviewed: Yes Transition of care needs: no transition of care needs at this time

## 2023-05-08 NOTE — Plan of Care (Signed)
 Will continue to monitor patient.

## 2023-05-08 NOTE — Progress Notes (Signed)
 Pharmacy: Dofetilide (Tikosyn) - Initial Consult Assessment and Electrolyte Replacement  Pharmacy consulted to assist in monitoring and replacing electrolytes in this 63 y.o. female admitted on 05/08/2023 undergoing dofetilide initiation. First dofetilide dose: 05/08/23  Assessment:  Patient Exclusion Criteria: If any screening criteria checked as "Yes", then  patient  should NOT receive dofetilide until criteria item is corrected.  If "Yes" please indicate correction plan.  YES  NO Patient  Exclusion Criteria Correction Plan   []   [x]   Baseline QTc interval is greater than or equal to 440 msec. IF above YES box checked dofetilide contraindicated unless patient has ICD; then may proceed if QTc 500-550 msec or with known ventricular conduction abnormalities may proceed with QTc 550-600 msec. QTc = 413    []   [x]   Patient is known or suspected to have a digoxin level greater than 2 ng/ml: No results found for: "DIGOXIN"     []   [x]   Creatinine clearance less than 20 ml/min (calculated using Cockcroft-Gault, actual body weight and serum creatinine): Estimated Creatinine Clearance: 65.2 mL/min (by C-G formula based on SCr of 0.72 mg/dL).     []   [x]  Patient has received drugs known to prolong the QT intervals within the last 48 hours (phenothiazines, tricyclics or tetracyclic antidepressants, erythromycin, H-1 antihistamines, cisapride, fluoroquinolones, azithromycin, ondansetron).   Updated information on QT prolonging agents is available to be searched on the following database:QT prolonging agents     []   [x]  Patient received a dose of a thiazide diuretic in the last 48 hours [including hydrochlorothiazide (Oretic) alone or in any combination including triamterene (Dyazide, Maxzide)].    []   [x]  Patient received a medication known to increase dofetilide plasma concentrations prior to initial dofetilide dose:  Trimethoprim (Primsol, Proloprim) in the last 36 hours Verapamil  (Calan, Verelan) in the last 36 hours or a sustained release dose in the last 72 hours Megestrol (Megace) in the last 5 days  Cimetidine (Tagamet) in the last 6 hours Ketoconazole (Nizoral) in the last 24 hours Itraconazole (Sporanox) in the last 48 hours  Prochlorperazine (Compazine) in the last 36 hours     []   [x]   Patient is known to have a history of torsades de pointes; congenital or acquired long QT syndromes.    []   [x]   Patient has received a Class 1 antiarrhythmic with less than 2 half-lives since last dose. (Disopyramide, Quinidine, Procainamide, Lidocaine, Mexiletine, Flecainide, Propafenone)    []   [x]   Patient has received amiodarone therapy in the past 3 months or amiodarone level is greater than 0.3 ng/ml.    Labs:    Component Value Date/Time   K 3.4 (L) 05/08/2023 0901   K 4.0 02/26/2013 1315   MG 2.1 05/08/2023 0901   MG 1.8 02/26/2013 1315     Plan: Select One Calculated CrCl  Dose q12h  [x]  > 60 ml/min 500 mcg  []  40-60 ml/min 250 mcg  []  20-40 ml/min 125 mcg   [x]   Physician selected initial dose within range recommended for patients level of renal function - will monitor for response.  []   Physician selected initial dose outside of range recommended for patients level of renal function - will discuss if the dose should be altered at this time.   Patient has been appropriately anticoagulated with apixaban 5mg  BID.  Potassium: K < 3.5:  Defer Tikosyn initiation until next day and give KCl 40 mEq q4hr x2 then BMET with AM labs.   Magnesium: Mg >2: Appropriate to  initiate Tikosyn, no replacement needed     Thank you for allowing pharmacy to participate in this patient's care   Mosetta Anis 05/08/2023  11:36 AM

## 2023-05-09 ENCOUNTER — Other Ambulatory Visit: Payer: Self-pay

## 2023-05-09 DIAGNOSIS — I4819 Other persistent atrial fibrillation: Secondary | ICD-10-CM | POA: Diagnosis not present

## 2023-05-09 LAB — BASIC METABOLIC PANEL
Anion gap: 10 (ref 5–15)
BUN: 7 mg/dL — ABNORMAL LOW (ref 8–23)
CO2: 22 mmol/L (ref 22–32)
Calcium: 9.1 mg/dL (ref 8.9–10.3)
Chloride: 106 mmol/L (ref 98–111)
Creatinine, Ser: 0.68 mg/dL (ref 0.44–1.00)
GFR, Estimated: >60 mL/min (ref >60)
Glucose, Bld: 122 mg/dL — ABNORMAL HIGH (ref 70–99)
Potassium: 4.5 mmol/L (ref 3.5–5.1)
Sodium: 138 mmol/L (ref 135–145)

## 2023-05-09 LAB — MAGNESIUM: Magnesium: 2 mg/dL (ref 1.7–2.4)

## 2023-05-09 MED ORDER — MAGNESIUM SULFATE 2 GM/50ML IV SOLN
2.0000 g | Freq: Once | INTRAVENOUS | Status: AC
  Administered 2023-05-09: 2 g via INTRAVENOUS
  Filled 2023-05-09: qty 50

## 2023-05-09 NOTE — Progress Notes (Signed)
 Morning EKG reviewed     Shows remains in NSR with borderline QTc at ~460-470 ms when measured manually and corrected for bradycardia.  Continue  Tikosyn 500 mcg BID. But will stop dilitazem.  Potassium4.5 (02/26 0519) Magnesium  2.0 (02/26 0519) Creatinine, ser  0.68 (02/26 0519)  Plan for home Friday if QTc remains stable   Graciella Freer, [place]  05/09/2023 3:16 PM

## 2023-05-09 NOTE — Plan of Care (Signed)
 Will continue to monitor.

## 2023-05-09 NOTE — Progress Notes (Signed)
 Pharmacy: Dofetilide (Tikosyn) - Follow Up Assessment and Electrolyte Replacement  Pharmacy consulted to assist in monitoring and replacing electrolytes in this 63 y.o. female admitted on 05/08/2023 undergoing dofetilide initiation. First dofetilide dose: 05/08/23  Labs:    Component Value Date/Time   K 4.5 05/09/2023 0519   K 4.0 02/26/2013 1315   MG 2.0 05/09/2023 0519   MG 1.8 02/26/2013 1315     Plan: Potassium: K >/= 4: No additional supplementation needed  Magnesium: Mg 1.8-2: Give Mg 2 gm IV x1    Fredonia Highland, PharmD, [place], [facility] Clinical Pharmacist [phone] Please check AMION for all [facility] Pharmacy numbers 05/09/2023

## 2023-05-09 NOTE — Progress Notes (Signed)
   Electrophysiology Rounding Note  Patient Name: [name] Date of Encounter: 05/09/2023  Primary Cardiologist: Debbe Odea, MD  Electrophysiologist: Lanier Prude, MD    Subjective   Pt remains in NSR on Tikosyn 500 mcg BID   QTc from EKG last pm shows stable QTc at ~420-440 when measured manually  The patient is doing well today.  At this time, the patient denies chest pain, shortness of breath, or any new concerns.  Inpatient Medications    Scheduled Meds:  apixaban  5 mg Oral BID   diltiazem  240 mg Oral Daily   dofetilide  500 mcg Oral BID   linaclotide  290 mcg Oral QAC breakfast   montelukast  10 mg Oral Daily   pantoprazole  40 mg Oral QAC breakfast   sodium chloride flush  3 mL Intravenous Q12H   Continuous Infusions:  sodium chloride     magnesium sulfate bolus IVPB     PRN Meds: sodium chloride, acetaminophen, ALPRAZolam, morphine, sodium chloride flush   Vital Signs    Vitals:   05/08/23 1134 05/08/23 1958 05/08/23 2300 05/09/23 0314  BP: 101/64 119/64 128/81 (!) 96/52  Pulse: 75  61 (!) 55  Resp: 18 16  15   Temp: 97.6 F (36.4 C) 98.3 F (36.8 C)    TempSrc: Oral Oral    Weight: 64.8 kg     Height: 5\' 3"  (1.6 m)      No intake or output data in the 24 hours ending 05/09/23 5621 Filed Weights   05/08/23 1134  Weight: 64.8 kg    Physical Exam    GEN- NAD, A&O x 3. Normal affect.  Lungs- CTAB, Normal effort.  Heart- Regular rate and rhythm. No M/G/R GI- Soft, NT, ND Extremities- No clubbing, cyanosis, or edema Skin- no rash or lesion  Labs    CBC No results for input(s): "WBC", "NEUTROABS", "HGB", "HCT", "MCV", "PLT" in the last 72 hours. Basic Metabolic Panel Recent Labs    30/86/57 0901 05/08/23 1811 05/09/23 0519  NA 137 139 138  K 3.4* 4.7 4.5  CL 104 105 106  CO2 23 25 22   GLUCOSE 125* 110* 122*  BUN 9 7* 7*  CREATININE 0.72 0.71 0.68  CALCIUM 9.5 9.3 9.1  MG 2.1  --  2.0    Telemetry    Sinus  brady / NSR 50-70s (personally reviewed)  Patient Profile     [name] is a 63 y.o. female with a past medical history significant for persistent atrial fibrillation.  They were admitted for tikosyn load.   Assessment & Plan    Persistent atrial fibrillation Pt remains in NSR on Tikosyn 500 mcg BID  Continue Eliquis Creatinine, ser  0.68 (02/26 0519) Magnesium  2.0 (02/26 0519) Potassium4.5 (02/26 0519) No electrolyte supplementation needed  Plan for home Friday if QTc remains stable.  Hypokalemia Following as above.   For questions or updates, please contact [facility] Please consult [email or web address] for contact info under Cardiology/STEMI.  Signed, Graciella Freer, PA-C  05/09/2023, 7:12 AM

## 2023-05-09 NOTE — Plan of Care (Signed)
Will continue monitor

## 2023-05-10 ENCOUNTER — Encounter (HOSPITAL_COMMUNITY)
Admission: AD | Disposition: A | Discharge status: Home / Self Care | Payer: Self-pay | Source: Ambulatory Visit | Attending: Cardiology

## 2023-05-10 ENCOUNTER — Other Ambulatory Visit: Payer: Self-pay

## 2023-05-10 DIAGNOSIS — I4819 Other persistent atrial fibrillation: Secondary | ICD-10-CM | POA: Diagnosis not present

## 2023-05-10 LAB — BASIC METABOLIC PANEL
Anion gap: 16 — ABNORMAL HIGH (ref 5–15)
BUN: 9 mg/dL (ref 8–23)
CO2: 23 mmol/L (ref 22–32)
Calcium: 9.7 mg/dL (ref 8.9–10.3)
Chloride: 100 mmol/L (ref 98–111)
Creatinine, Ser: 0.68 mg/dL (ref 0.44–1.00)
GFR, Estimated: >60 mL/min (ref >60)
Glucose, Bld: 120 mg/dL — ABNORMAL HIGH (ref 70–99)
Potassium: 3.8 mmol/L (ref 3.5–5.1)
Sodium: 139 mmol/L (ref 135–145)

## 2023-05-10 LAB — MAGNESIUM: Magnesium: 2.2 mg/dL (ref 1.7–2.4)

## 2023-05-10 SURGERY — CARDIOVERSION (CATH LAB)
Anesthesia: General

## 2023-05-10 MED ORDER — POTASSIUM CHLORIDE CRYS ER 20 MEQ PO TBCR
40.0000 meq | EXTENDED_RELEASE_TABLET | Freq: Once | ORAL | Status: AC
  Administered 2023-05-10: 40 meq via ORAL
  Filled 2023-05-10: qty 2

## 2023-05-10 MED ORDER — DOFETILIDE 500 MCG PO CAPS
500.0000 ug | ORAL_CAPSULE | Freq: Two times a day (BID) | ORAL | Status: DC
  Administered 2023-05-10 – 2023-05-11 (×3): 500 ug via ORAL
  Filled 2023-05-10 (×3): qty 1

## 2023-05-10 NOTE — Progress Notes (Signed)
 Morning EKG reviewed     Shows remains in NSR with stable QTc at ~460 ms.  Continue  Tikosyn 500 mcg BID.   Potassium3.8 (02/27 0421) Magnesium  2.2 (02/27 0421) Creatinine, ser  0.68 (02/27 0421)  Plan for home Friday if QTc remains stable   Graciella Freer, [place]  05/10/2023 11:02 AM

## 2023-05-10 NOTE — Progress Notes (Addendum)
   Electrophysiology Rounding Note  Patient Name: [name] Date of Encounter: 05/10/2023  Primary Cardiologist: Debbe Odea, MD  Electrophysiologist: Lanier Prude, MD    Subjective   Pt remains in NSR on Tikosyn 500 mcg BID   QTc from EKG last pm shows borderline QTc at ~490-500 when measured manually, though confounded by U wave  The patient is doing well today.  At this time, the patient denies chest pain, shortness of breath, or any new concerns.  Inpatient Medications    Scheduled Meds:  apixaban  5 mg Oral BID   dofetilide  500 mcg Oral BID   linaclotide  290 mcg Oral QAC breakfast   montelukast  10 mg Oral Daily   pantoprazole  40 mg Oral QAC breakfast   potassium chloride  40 mEq Oral Once   sodium chloride flush  3 mL Intravenous Q12H   Continuous Infusions:  PRN Meds: acetaminophen, ALPRAZolam, morphine, sodium chloride flush   Vital Signs    Vitals:   05/09/23 0713 05/09/23 1456 05/09/23 1952 05/10/23 0455  BP: 115/72 101/66 110/65 111/66  Pulse: (!) 55 60 61 (!) 54  Resp: 18 16 20 16   Temp:  98 F (36.7 C) 97.8 F (36.6 C) 98 F (36.7 C)  TempSrc:  Oral Oral Oral  SpO2:  96% 95% 96%  Weight:      Height:        Intake/Output Summary (Last 24 hours) at 05/10/2023 0715 Last data filed at 05/09/2023 1458 Gross per 24 hour  Intake 358 ml  Output --  Net 358 ml   Filed Weights   05/08/23 1134  Weight: 64.8 kg    Physical Exam    GEN- NAD, A&O x 3. Normal affect.  Lungs- CTAB, Normal effort.  Heart- Regular rate and rhythm. No M/G/R GI- Soft, NT, ND Extremities- No clubbing, cyanosis, or edema Skin- no rash or lesion  Labs    CBC No results for input(s): "WBC", "NEUTROABS", "HGB", "HCT", "MCV", "PLT" in the last 72 hours. Basic Metabolic Panel Recent Labs    29/56/21 0519 05/10/23 0421  NA 138 139  K 4.5 3.8  CL 106 100  CO2 22 23  GLUCOSE 122* 120*  BUN 7* 9  CREATININE 0.68 0.68  CALCIUM 9.1 9.7  MG 2.0  2.2    Telemetry    SB/NSR 50-60s (personally reviewed)  Patient Profile     [name] is a 63 y.o. female with a past medical history significant for persistent atrial fibrillation.  They were admitted for tikosyn load.   Assessment & Plan    Persistent atrial fibrillation High risk drug monitoring Pt remains in NSR  Reviewed with MD and EKG shows QT ~480-500, with QTc correcting to 450-460 when manually measured. Continue Tikosyn 500 mcg BID for now.  Continue Eliquis Creatinine, ser  0.68 (02/27 0421) Magnesium  2.2 (02/27 0421) Potassium3.8 (02/27 0421) Supplement K  Plan for home Friday if QTc remains stable.  Hypokalemia Will likely require home supp.   For questions or updates, please contact [facility] Please consult [email or web address] for contact info under Cardiology/STEMI.  Signed, Graciella Freer, PA-C  05/10/2023, 7:15 AM

## 2023-05-10 NOTE — Discharge Instructions (Signed)
 Dofetilide Capsules What is this medication? DOFETILIDE (doe FET il ide) treats a fast or irregular heartbeat (arrhythmia). It works by slowing down overactive electric signals in the heart, which stabilizes your heart rhythm. It belongs to a group of medications called antiarrhythmics. This medicine may be used for other purposes; ask your health care provider or pharmacist if you have questions. COMMON BRAND NAME(S): Tikosyn What should I tell my care team before I take this medication? They need to know if you have any of these conditions: Heart disease History of irregular heartbeat History of low levels of potassium or magnesium in the blood Kidney disease Liver disease An unusual or allergic reaction to dofetilide, other medications, foods, dyes, or preservatives Pregnant or trying to get pregnant Breast-feeding How should I use this medication? Take this medication by mouth with a glass of water. Follow the directions on the prescription label. Do not take with grapefruit juice. You can take it with or without food. If it upsets your stomach, take it with food. Take your medication at regular intervals. Do not take it more often than directed. Do not stop taking except on your care team's advice. A special MedGuide will be given to you by the pharmacist with each prescription and refill. Be sure to read this information carefully each time. Talk to your care team about the use of this medication in children. Special care may be needed. Overdosage: If you think you have taken too much of this medicine contact a poison control center or emergency room at once. NOTE: This medicine is only for you. Do not share this medicine with others. What if I miss a dose? If you miss a dose, skip it. Take your next dose at the normal time. Do not take extra or 2 doses at the same time to make up for the missed dose. What may interact with this medication? Do not take this medication with any of the  following: Benadryl (Diphenhydramine) Cimetidine Cisapride Dolutegravir Dronedarone Erdafitinib Hydrochlorothiazide Immodium Ketoconazole Megestrol Pimozide Prochlorperazine Thioridazine Trimethoprim Verapamil This medication may also interact with the following: Amiloride Cannabinoids Certain antibiotics like erythromycin or clarithromycin Certain antiviral medications for HIV or hepatitis Certain medications for depression, anxiety, or psychotic disorders Digoxin Diltiazem Grapefruit juice Metformin Nefazodone Other medications that prolong the QT interval (an abnormal heart rhythm) Quinine Triamterene Zafirlukast Ziprasidone This list may not describe all possible interactions. Give your health care provider a list of all the medicines, herbs, non-prescription drugs, or dietary supplements you use. Also tell them if you smoke, drink alcohol, or use illegal drugs. Some items may interact with your medicine. What should I watch for while using this medication? Your condition will be monitored carefully while you are receiving this medication. What side effects may I notice from receiving this medication? Side effects that you should report to your care team as soon as possible: Allergic reactions--skin rash, itching, hives, swelling of the face, lips, tongue, or throat Chest pain Heart rhythm changes--fast or irregular heartbeat, dizziness, feeling faint or lightheaded, chest pain, trouble breathing Side effects that usually do not require medical attention (report to your care team if they continue or are bothersome): Dizziness Headache Nausea Stomach pain Trouble sleeping This list may not describe all possible side effects. Call your doctor for medical advice about side effects. You may report side effects to FDA at 1-800-FDA-1088. Where should I keep my medication? Keep out of the reach of children. Store at room temperature between 15 and 30 degrees  C (59 and 86  degrees F). Throw away any unused medication after the expiration date. NOTE: This sheet is a summary. It may not cover all possible information. If you have questions about this medicine, talk to your doctor, pharmacist, or health care provider.  2024 Elsevier/Gold Standard (2021-01-28 00:00:00)

## 2023-05-10 NOTE — Progress Notes (Signed)
 Pharmacy: Dofetilide (Tikosyn) - Follow Up Assessment and Electrolyte Replacement  Pharmacy consulted to assist in monitoring and replacing electrolytes in this 63 y.o. female admitted on 05/08/2023 undergoing dofetilide initiation. First dofetilide dose: 05/08/23  Labs:    Component Value Date/Time   K 3.8 05/10/2023 0421   K 4.0 02/26/2013 1315   MG 2.2 05/10/2023 0421   MG 1.8 02/26/2013 1315     Plan: Potassium: K 3.8-3.9:  Give KCl 40 mEq po x1   Magnesium: Mg > 2: No additional supplementation needed   Fredonia Highland, PharmD, BCPS, [facility] Clinical Pharmacist [phone] Please check AMION for all [facility] Pharmacy numbers 05/10/2023

## 2023-05-11 ENCOUNTER — Other Ambulatory Visit (HOSPITAL_COMMUNITY): Payer: Self-pay

## 2023-05-11 DIAGNOSIS — I4819 Other persistent atrial fibrillation: Secondary | ICD-10-CM | POA: Diagnosis not present

## 2023-05-11 LAB — MAGNESIUM: Magnesium: 2.3 mg/dL (ref 1.7–2.4)

## 2023-05-11 LAB — BASIC METABOLIC PANEL
Anion gap: 10 (ref 5–15)
BUN: 11 mg/dL (ref 8–23)
CO2: 23 mmol/L (ref 22–32)
Calcium: 9.4 mg/dL (ref 8.9–10.3)
Chloride: 106 mmol/L (ref 98–111)
Creatinine, Ser: 0.75 mg/dL (ref 0.44–1.00)
GFR, Estimated: >60 mL/min (ref >60)
Glucose, Bld: 105 mg/dL — ABNORMAL HIGH (ref 70–99)
Potassium: 4.9 mmol/L (ref 3.5–5.1)
Sodium: 139 mmol/L (ref 135–145)

## 2023-05-11 MED ORDER — DOFETILIDE 500 MCG PO CAPS
500.0000 ug | ORAL_CAPSULE | Freq: Two times a day (BID) | ORAL | 11 refills | Status: DC
  Filled 2023-05-11: qty 60, 30d supply, fill #0

## 2023-05-11 NOTE — Progress Notes (Addendum)
 Pharmacy: Dofetilide (Tikosyn) - Follow Up Assessment and Electrolyte Replacement  Pharmacy consulted to assist in monitoring and replacing electrolytes in this 63 y.o. female admitted on 05/08/2023 undergoing dofetilide initiation. First dofetilide dose: 05/08/23  Labs:    Component Value Date/Time   K 4.9 05/11/2023 0440   K 4.0 02/26/2013 1315   MG 2.3 05/11/2023 0440   MG 1.8 02/26/2013 1315     Plan: Potassium: K >/= 4: No additional supplementation needed  Magnesium: Mg > 2: No additional supplementation needed   As patient has required on average 25 mEq of potassium replacement every day, recommend discharging patient with prescription for:  Potassium chloride 20 mEq  daily Note K has been labile this admit so would probably defer supplementation at D/C  Thank you for allowing pharmacy to participate in this patient's care   Mosetta Anis 05/11/2023  7:23 AM

## 2023-05-11 NOTE — Discharge Summary (Signed)
 ELECTROPHYSIOLOGY DISCHARGE SUMMARY    Patient ID: [name],  MRN: [id number], DOB/AGE: 1960-11-11 63 y.o.  Admit date: 05/08/2023 Discharge date: 05/11/2023  Primary Care Physician: Lanier Prude, MD  Primary Cardiologist: Debbe Odea, MD  Electrophysiologist: Dr. Lalla Brothers   Primary Discharge Diagnosis:  1.  Persistent atrial fibrillation status post Tikosyn loading this admission  Secondary Discharge Diagnosis:  Hypokalemia   Allergies  Allergen Reactions   Amoxicillin Hives    She did ok w ANCEF   Chlorhexidine Gluconate Dermatitis and Hives   Clindamycin Hives   Codeine Hives   Erythromycin Hives    "mycins" in general   Penicillin G Hives    "cillins" in general   Sulfa Antibiotics Nausea And Vomiting and Hives        Shellfish Allergy Hives   Decadron [Dexamethasone] Other (See Comments)    Hot flashes, insomnia, "manic"    Flecainide Nausea And Vomiting    Pts said she signs of going into heart failure. Edema   Papaya Derivatives Hives   Betadine [Povidone Iodine] Hives        Other Hives     Mango    Prednisone Anxiety    High blood pressure, flushed, mood changes, heart palpitations      Procedures This Admission:  1.  Tikosyn loading   Brief HPI: [name] is a 63 y.o. female with a past medical history as noted above.  They were referred to EP for treatment options of atrial fibrillation.  Risks, benefits, and alternatives to Tikosyn were reviewed with the patient who wished to proceed with admission for loading.  Hospital Course:  The patient was admitted and Tikosyn was initiated.  Renal function and electrolytes were followed during the hospitalization.  Their QTc remained stable. The patient presented in sinus rhythm and did not require cardioversion. The patients QTc remained stable on 500 mcg BID. They were monitored on telemetry up to discharge. On the day of discharge, they were examined by Dr. Lalla Brothers  who  considered them stable for discharge to home.  Follow-up has been arranged with the Atrial Fibrillation clinic in approximately 1 week.   Physical Exam: Vitals:   05/10/23 1100 05/10/23 1445 05/10/23 1947 05/11/23 0448  BP: 112/72 (!) 97/57 117/80 103/62  Pulse: (!) 58 60 60 (!) 57  Resp: 16 16 20 16   Temp: 98.3 F (36.8 C) 98.2 F (36.8 C) 98.4 F (36.9 C) 98.3 F (36.8 C)  TempSrc: Oral Oral Oral Oral  SpO2:   98% 100%  Weight:      Height:        GEN- NAD, A&O x 3. Normal affect.  Lungs- CTAB, Normal effort.  Heart- Regular rate and rhythm. No M/G/R GI- Soft, NT, ND Extremities- No clubbing, cyanosis, or edema Skin- no rash or lesion  Labs:   Lab Results  Component Value Date   WBC 7.6 04/30/2023   HGB 13.9 04/30/2023   HCT 40.5 04/30/2023   MCV 86.2 04/30/2023   PLT 399 04/30/2023    Recent Labs  Lab 05/11/23 0440  NA 139  K 4.9  CL 106  CO2 23  BUN 11  CREATININE 0.75  CALCIUM 9.4  GLUCOSE 105*    Discharge Medications:  Allergies as of 05/11/2023       Reactions   Amoxicillin Hives   She did ok w ANCEF   Chlorhexidine Gluconate Dermatitis, Hives   Clindamycin Hives   Codeine Hives  Erythromycin Hives   "mycins" in general   Penicillin G Hives   "cillins" in general   Sulfa Antibiotics Nausea And Vomiting, Hives       Shellfish Allergy Hives   Decadron [dexamethasone] Other (See Comments)   Hot flashes, insomnia, "manic"   Flecainide Nausea And Vomiting   Pts said she signs of going into heart failure. Edema   Papaya Derivatives Hives   Betadine [povidone Iodine] Hives       Other Hives    Mango   Prednisone Anxiety   High blood pressure, flushed, mood changes, heart palpitations        Medication List     STOP taking these medications    diltiazem 240 MG 24 hr capsule Commonly known as: CARDIZEM CD   diltiazem 30 MG tablet Commonly known as: Cardizem       TAKE these medications    acetaminophen 325 MG  tablet Commonly known as: TYLENOL Take 2 tablets (650 mg total) by mouth every 6 (six) hours as needed for mild pain (or Fever >/= 101). What changed:  how much to take when to take this   ALLERGY NASAL SPRAY NA Place 1 spray into the nose daily as needed (allergies/ decongestion).   ALPRAZolam 1 MG tablet Commonly known as: XANAX Take 1 mg by mouth at bedtime.   cholecalciferol 25 MCG (1000 UNIT) tablet Commonly known as: VITAMIN D3 Take 1,000 Units by mouth daily.   dofetilide 500 MCG capsule Commonly known as: TIKOSYN Take 1 capsule (500 mcg total) by mouth 2 (two) times daily.   Eliquis 5 MG Tabs tablet Generic drug: apixaban TAKE 1 TABLET BY MOUTH TWICE A DAY   linaclotide 290 MCG Caps capsule Commonly known as: Linzess Take 1 capsule (290 mcg total) by mouth daily before breakfast.   montelukast 10 MG tablet Commonly known as: SINGULAIR TAKE 1 TABLET BY MOUTH EVERY DAY What changed: when to take this   morphine 15 MG tablet Commonly known as: MSIR Take 1 tablet (15 mg total) by mouth every 6 (six) hours as needed for moderate pain (pain score 4-6) or severe pain (pain score 7-10). Must last 30 days. Start taking on: May 20, 2023   naloxone 4 MG/0.1ML Liqd nasal spray kit Commonly known as: Counselling psychologist 1 spray into the nose as needed for up to 365 doses (for opioid-induced respiratory depresssion). In case of emergency (overdose), spray once into each nostril. If no response within 3 minutes, repeat application and call 911.   pantoprazole 40 MG tablet Commonly known as: PROTONIX Take 1 tablet (40 mg total) by mouth daily before breakfast.   valACYclovir 1000 MG tablet Commonly known as: VALTREX TAKE TWO TABLETS BY MOUTH TWICE DAILY FOR ONE DAY FEVER BLISTERTAKE TWO TABLETS BY MOUTH TWICE DAILY FOR ONE DAY FEVER BLISTER        Disposition:  Home with follow up in AF clinic in 1 week as in AVS.   Duration of Discharge Encounter: Greater than 30 minutes  including physician time.  Signed, Canary Brim, NP-C, AGACNP-BC [facility] - Electrophysiology  05/11/2023, 12:14 PM

## 2023-05-11 NOTE — Progress Notes (Signed)
 EKG from yesterday evening 05/10/2023 reviewed     Shows remains in NSR with stable QTc at 447 ms.  Continue  Tikosyn 500 mcg BID.   Potassium4.9 (02/28 0440) Magnesium  2.3 (02/28 0440) Creatinine, ser  0.75 (02/28 0440)  Plan for home Friday if QTc remains stable    Canary Brim, NP-C, AGACNP-BC [facility] - Electrophysiology  05/11/2023, 7:12 AM

## 2023-05-16 ENCOUNTER — Ambulatory Visit: Payer: Medicare Other | Admitting: Cardiology

## 2023-05-18 ENCOUNTER — Encounter (HOSPITAL_COMMUNITY): Payer: Self-pay | Admitting: Internal Medicine

## 2023-05-18 ENCOUNTER — Ambulatory Visit: Payer: Medicare Other | Admitting: Cardiology

## 2023-05-18 ENCOUNTER — Ambulatory Visit (HOSPITAL_COMMUNITY)
Admit: 2023-05-18 | Discharge: 2023-05-18 | Disposition: A | Discharge status: Home / Self Care | Payer: Medicare Other | Source: Ambulatory Visit | Attending: Internal Medicine | Admitting: Internal Medicine

## 2023-05-18 ENCOUNTER — Encounter (HOSPITAL_COMMUNITY): Payer: Medicare Other | Admitting: Internal Medicine

## 2023-05-18 VITALS — BP 124/90 | HR 76 | Ht 63.0 in | Wt 140.0 lb

## 2023-05-18 DIAGNOSIS — G4733 Obstructive sleep apnea (adult) (pediatric): Secondary | ICD-10-CM | POA: Diagnosis not present

## 2023-05-18 DIAGNOSIS — I4891 Unspecified atrial fibrillation: Secondary | ICD-10-CM | POA: Diagnosis present

## 2023-05-18 DIAGNOSIS — I4819 Other persistent atrial fibrillation: Secondary | ICD-10-CM | POA: Diagnosis not present

## 2023-05-18 DIAGNOSIS — I4892 Unspecified atrial flutter: Secondary | ICD-10-CM | POA: Diagnosis present

## 2023-05-18 DIAGNOSIS — I483 Typical atrial flutter: Secondary | ICD-10-CM | POA: Insufficient documentation

## 2023-05-18 DIAGNOSIS — Z79899 Other long term (current) drug therapy: Secondary | ICD-10-CM | POA: Insufficient documentation

## 2023-05-18 DIAGNOSIS — Z7901 Long term (current) use of anticoagulants: Secondary | ICD-10-CM | POA: Insufficient documentation

## 2023-05-18 DIAGNOSIS — Z5181 Encounter for therapeutic drug level monitoring: Secondary | ICD-10-CM

## 2023-05-18 DIAGNOSIS — I484 Atypical atrial flutter: Secondary | ICD-10-CM | POA: Diagnosis not present

## 2023-05-18 DIAGNOSIS — I10 Essential (primary) hypertension: Secondary | ICD-10-CM | POA: Insufficient documentation

## 2023-05-18 DIAGNOSIS — D6869 Other thrombophilia: Secondary | ICD-10-CM

## 2023-05-18 LAB — BASIC METABOLIC PANEL
Anion gap: 12 (ref 5–15)
BUN: 5 mg/dL — ABNORMAL LOW (ref 8–23)
CO2: 23 mmol/L (ref 22–32)
Calcium: 9.6 mg/dL (ref 8.9–10.3)
Chloride: 103 mmol/L (ref 98–111)
Creatinine, Ser: 0.84 mg/dL (ref 0.44–1.00)
GFR, Estimated: >60 mL/min (ref >60)
Glucose, Bld: 112 mg/dL — ABNORMAL HIGH (ref 70–99)
Potassium: 4 mmol/L (ref 3.5–5.1)
Sodium: 138 mmol/L (ref 135–145)

## 2023-05-18 LAB — MAGNESIUM: Magnesium: 2.2 mg/dL (ref 1.7–2.4)

## 2023-05-18 MED ORDER — DOFETILIDE 500 MCG PO CAPS: 500.0000 ug | ORAL_CAPSULE | Freq: Two times a day (BID) | ORAL | 4 refills | Status: DC

## 2023-05-18 NOTE — Progress Notes (Signed)
 Primary Care Physician: Lanier Prude, MD Primary Cardiologist: Dr Azucena Cecil Primary Electrophysiologist: Dr Graciela Husbands Referring Physician: Dr Oliva Bustard is a 63 y.o. female with a history of OSA, HTN, atrial flutter, atrial fibrillation who presents for follow up in the [facility]. Patient is on Eliquis for a CHADS2VASC score of 2. She had been maintained on amiodarone but underwent afib and flutter ablation on 08/06/20 in order to avoid the off target effects of amio.   On follow up today, patient reports she did have some post op CP which resolved with ibuprofen (phone note 08/18/20). She was inadvertently taking amlodipine instead of continuing amiodarone. She is now back on amiodarone. She has had a few episodes of heart racing since the ablation while off amiodarone.   On evaluation today, she is currently in NSR. S/p Afib and flutter ablation on 12/04/22 by Dr. Lalla Brothers. She notes to have brief paroxysmal episodes that last 10-15 minutes of Afib but overall NSR > Afib. She still feels a little tired and SOB when walking. No chest pain or trouble swallowing. Leg sites healed without issue. No missed doses of anticoagulant.  On follow up 05/08/23, patient is here for Tikosyn admission. No new medications since last OV. She stopped zofran after discussion last office visit. She takes Morphine 15 mg q 6 hr and has done so for years per patient. She takes xanax 1 mg at bedtime. No benadryl use. No missed doses of Eliquis 5 mg BID.  On follow up 05/18/23, patient is here for 1 week Tikosyn surveillance. S/p Tikosyn admission 2/25-28/25. She did not require cardioversion. Discharged on Tikosyn 500 mcg BID. She has had 2 brief episodes of palpitations since hospital discharge. No missed doses of Tikosyn or Eliquis. She uses an alarm to not miss doses.   Today, she denies symptoms of orthopnea, PND, lower extremity edema, dizziness, presyncope, syncope,  snoring, daytime somnolence, bleeding, or neurologic sequela. The patient is tolerating medications without difficulties and is otherwise without complaint today.    Atrial Fibrillation Risk Factors:  she does have symptoms or diagnosis of sleep apnea. she is compliant with CPAP therapy. she does not have a history of rheumatic fever.   she has a BMI of Body mass index is 24.8 kg/m.Marland Kitchen Filed Weights   05/18/23 1020  Weight: 63.5 kg      Family History  Problem Relation Age of Onset   Aneurysm Mother    Bipolar disorder Sister    Aneurysm Maternal Grandmother    Breast cancer Paternal Grandmother 27   Bipolar disorder Grandchild    Anxiety disorder Grandchild    Depression Grandchild    Cancer Neg Hx     Atrial Fibrillation Management history:  Previous antiarrhythmic drugs: amiodarone, tikosyn Previous cardioversions: none Previous ablations: 08/06/20, 12/04/22 CHADS2VASC score: 2 Anticoagulation history: Eliquis   Past Medical History:  Diagnosis Date   Abnormal EKG    HX OF INVERTED T WAVES ON EKG, PALPITATIONS, CHEST PAINS-CARDIAC WORK UP DID NOT SHOW ANY HEART DISEASE   AC (acromioclavicular) joint bone spurs    Acute postoperative pain 01/04/2017   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    Chronic headaches    Chronic, continuous use of opioids    DDD (degenerative disc disease)  CERVICAL AND LUMBAR-CHRONIC PAIN, RT HIP LABRAL TEAR   Depression    PT STATES A LOT OF STRESS IN HER LIFE   Dissociative disorder    Dizziness 04/22/2013   Duodenal ulcer with hemorrhage and perforation (HCC) 04/27/2003   Foot drop, right    FROM BACK SURGERY   GERD (gastroesophageal reflux disease)    H/O arthrodesis 08/18/2014   Headache(784.0)    AND NECK PAIN--STATES RECENT TEST SHOW CERVICAL DEGENERATION   History of blood transfusion     s/p back surgery   History of cardiac cath    a. 05/2019 Cath: Nl cors. EF 55-65%.   History of cardioversion    History of cervical spinal surgery 01/04/2015   History of kidney stones    Hypertension    Inverted T wave    Iron deficiency 02/01/2021   Mitral regurgitation    a. 07/2020 Echo: EF 60-65%, no rwma. Nl RV size/fxn. RVSP 39.64mmHg. Mildly to mod dil LA. Mod MR; b. 07/2020 TEE: EF 55-60%. Lambl's excresence. Nl RV size/fxn. Midly dil LA. No LA/LAA thrombus. Mild MR.   Narrowing of intervertebral disc space 08/18/2014   Currently on disability.    Orthostatic hypotension 04/22/2013   Pain    CHRONIC NECK AND BACK PAIN - LIMITED ROM NECK - S/P FUSIONS CERVICAL AND LUMBAR   Paroxysmal atrial fibrillation (HCC) 2021   a. 07/2020 s/p PVI.   Pneumonia    PONV (postoperative nausea and vomiting)    PT GIVES HX OF N&V AND FEVER WITH SURGERIES YEARS AGO--BUT NO PROBLEMS WITH MORE RECENT SURGERIES--STATES NOT MALIGNANT HYPERTHERMIA   Postop Hyponatremia 05/14/2012   Postoperative anemia due to acute blood loss 05/14/2012   PTSD (post-traumatic stress disorder)    Right foot drop    Right hip arthralgia 08/18/2014   Status post surgery of right, and now needs left.    Sleep apnea 2021   does not have a cpap   Therapeutic opioid-induced constipation (OIC)    Typical atrial flutter [facility])    Past Surgical History:  Procedure Laterality Date   ABDOMINAL HYSTERECTOMY     afib  05/2019   ANTERIOR CERVICAL DECOMP/DISCECTOMY FUSION N/A 10/30/2012   Procedure: ACDF C5-6, EXPLORATION AND HARDWARE REMOVAL C6-7;  Surgeon: Venita Lick, MD;  Location: [facility] OR;  Service: Orthopedics;  Laterality: N/A;   ANTERIOR FUSION CERVICAL SPINE  MAY 2012   AT [facility]   ATRIAL FIBRILLATION ABLATION N/A 08/06/2020   Procedure: ATRIAL FIBRILLATION ABLATION;  Surgeon: Lanier Prude, MD;  Location: [facility] INVASIVE CV LAB;  Service: Cardiovascular;  Laterality: N/A;   ATRIAL FIBRILLATION ABLATION N/A 12/04/2022    Procedure: ATRIAL FIBRILLATION ABLATION;  Surgeon: Lanier Prude, MD;  Location: [facility] LAB;  Service: Cardiovascular;  Laterality: N/A;   BACK SURGERY  2009   LUMBAR FUSION WITH RODS    BREAST BIOPSY Right 2008   benign.- bx/clip   BREAST EXCISIONAL BIOPSY Left 1998   benign   BREAST REDUCTION SURGERY Bilateral 06/2016   CARDIAC ELECTROPHYSIOLOGY MAPPING AND ABLATION     CARDIOVERSION N/A 12/03/2019   Procedure: CARDIOVERSION;  Surgeon: Debbe Odea, MD;  Location: [facility] ORS;  Service: Cardiovascular;  Laterality: N/A;   CARDIOVERSION N/A 08/01/2021   Procedure: CARDIOVERSION;  Surgeon: Debbe Odea, MD;  Location: [facility] ORS;  Service: Cardiovascular;  Laterality: N/A;   CARDIOVERSION N/A 08/05/2021   Procedure: CARDIOVERSION;  Surgeon: Antonieta Iba, MD;  Location: [facility] ORS;  Service: Cardiovascular;  Laterality: N/A;  CARPAL TUNNEL RELEASE  05-06-12   Right   CHOLECYSTECTOMY     COLONOSCOPY WITH PROPOFOL N/A 01/26/2017   Procedure: COLONOSCOPY WITH PROPOFOL;  Surgeon: Midge Minium, MD;  [facility];  Service: Endoscopy;  Laterality: N/A;   COLONOSCOPY WITH PROPOFOL N/A 01/16/2021   Procedure: COLONOSCOPY WITH PROPOFOL;  Surgeon: Toney Reil, MD;  Location: [facility] ENDOSCOPY;  Service: Gastroenterology;  Laterality: N/A;   DIAGNOSTIC LAPAROSCOPIES - MULTIPLE FOR ENDOMETRIOSIS     ESOPHAGEAL DILATION N/A 01/26/2017   Procedure: ESOPHAGEAL DILATION;  Surgeon: Midge Minium, MD;  Location: [facility] SURGERY [facility]TR;  Service: Endoscopy;  Laterality: N/A;   ESOPHAGEAL MANOMETRY N/A 05/30/2017   Procedure: ESOPHAGEAL MANOMETRY (EM);  Surgeon: Midge Minium, MD;  Location: [facility] ENDOSCOPY;  Service: Endoscopy;  Laterality: N/A;   ESOPHAGOGASTRODUODENOSCOPY (EGD) WITH PROPOFOL N/A 01/26/2017   Procedure: ESOPHAGOGASTRODUODENOSCOPY (EGD) WITH PROPOFOL;  Surgeon: Midge Minium, MD;  Location: [facility] SURGERY [facility]TR;  Service: Endoscopy;  Laterality: N/A;    ESOPHAGOGASTRODUODENOSCOPY (EGD) WITH PROPOFOL N/A 01/30/2020   Procedure: ESOPHAGOGASTRODUODENOSCOPY (EGD) WITH PROPOFOL;  Surgeon: Midge Minium, MD;  Location: [facility] SURGERY [facility];  Service: Endoscopy;  Laterality: N/A;  sleep apnea COVID + 01-15-20   ESOPHAGOGASTRODUODENOSCOPY (EGD) WITH PROPOFOL N/A 01/15/2021   Procedure: ESOPHAGOGASTRODUODENOSCOPY (EGD) WITH PROPOFOL;  Surgeon: Toney Reil, MD;  Location: [facility] ENDOSCOPY;  Service: Gastroenterology;  Laterality: N/A;   ESOPHAGOGASTRODUODENOSCOPY (EGD) WITH PROPOFOL N/A 12/07/2021   Procedure: ESOPHAGOGASTRODUODENOSCOPY (EGD) WITH PROPOFOL;  Surgeon: Toney Reil, MD;  Location: [facility] ENDOSCOPY;  Service: Gastroenterology;  Laterality: N/A;   HIP ARTHROSCOPY  09/20/2011   Procedure: ARTHROSCOPY HIP;  Surgeon: Loanne Drilling, MD;  Location: [facility] ORS;  Service: Orthopedics;  Laterality: Right;  Right Hip Scope with Labral Debridement   LEFT HEART CATH AND CORONARY ANGIOGRAPHY Left 06/10/2019   Procedure: LEFT HEART CATH AND CORONARY ANGIOGRAPHY;  Surgeon: Yvonne Kendall, MD;  Location: [facility] INVASIVE CV LAB;  Service: Cardiovascular;  Laterality: Left;   LOWER EXTREMITY ANGIOGRAPHY Left 01/06/2022   Procedure: Lower Extremity Angiography;  Surgeon: Renford Dills, MD;  Location: [facility] INVASIVE CV LAB;  Service: Cardiovascular;  Laterality: Left;   NASAL SEPTUM SURGERY  MARCH 2013   IN [place]   [facility] IMPEDANCE STUDY N/A 05/30/2017   Procedure: PH IMPEDANCE STUDY;  Surgeon: Midge Minium, MD;  Location: [facility] ENDOSCOPY;  Service: Endoscopy;  Laterality: N/A;   POLYPECTOMY N/A 01/26/2017   Procedure: POLYPECTOMY;  Surgeon: Midge Minium, MD;  Location: [facility] SURGERY CNTR;  Service: Endoscopy;  Laterality: N/A;   POSTERIOR CERVICAL FUSION/FORAMINOTOMY N/A 04/16/2013   Procedure: REMOVAL CERVICAL PLATES AND INTERBODY CAGE/POSTERIOR CERVICAL SPINAL FUSION C4 - C6/C5 CORPECTOMY/C4 - C6 FUSION WITH ILIAC CREST BONE GRAFT;  Surgeon: Venita Lick, MD;  Location: [facility] OR;  Service: Orthopedics;  Laterality: N/A;   RADIOFREQUENCY ABLATION NERVES     REDUCTION MAMMAPLASTY Bilateral 05/2016   RIGHT HIP ARTHROSCOPY FOR LABRAL TEAR  ABOUT 2010   2012 also   SHOULDER ARTHROSCOPY  05-06-12   bone spur   TEE WITHOUT CARDIOVERSION N/A 08/05/2020   Procedure: TRANSESOPHAGEAL ECHOCARDIOGRAM (TEE);  Surgeon: Lewayne Bunting, MD;  Location: [facility] ENDOSCOPY;  Service: Cardiovascular;  Laterality: N/A;   TONSILLECTOMY     AS A CHILD   TOTAL HIP ARTHROPLASTY Right 05/13/2012   Procedure: TOTAL HIP ARTHROPLASTY ANTERIOR APPROACH;  Surgeon: Loanne Drilling, MD;  Location: [facility] ORS;  Service: Orthopedics;  Laterality: Right;    Current Outpatient Medications  Medication Sig Dispense Refill  acetaminophen (TYLENOL) 325 MG tablet Take 2 tablets (650 mg total) by mouth every 6 (six) hours as needed for mild pain (or Fever >/= 101). (Patient taking differently: Take 1,000 mg by mouth as needed for mild pain (pain score 1-3) (or Fever >/= 101).)     ALPRAZolam (XANAX) 1 MG tablet Take 1 mg by mouth at bedtime.      cholecalciferol (VITAMIN D3) 25 MCG (1000 UNIT) tablet Take 1,000 Units by mouth daily.     ELIQUIS 5 MG TABS tablet TAKE 1 TABLET BY MOUTH TWICE A DAY 60 tablet 5   linaclotide (LINZESS) 290 MCG CAPS capsule Take 1 capsule (290 mcg total) by mouth daily before breakfast. 30 capsule 5   Mometasone Furoate (ALLERGY NASAL SPRAY NA) Place 1 spray into the nose daily as needed (allergies/ decongestion).     montelukast (SINGULAIR) 10 MG tablet TAKE 1 TABLET BY MOUTH EVERY DAY (Patient taking differently: Take 10 mg by mouth at bedtime.) 90 tablet 1   [START ON 05/20/2023] morphine (MSIR) 15 MG tablet Take 1 tablet (15 mg total) by mouth every 6 (six) hours as needed for moderate pain (pain score 4-6) or severe pain (pain score 7-10). Must last 30 days. 120 tablet 0   naloxone (NARCAN) nasal spray 4 mg/0.1 mL Place 1 spray into the nose as needed for up  to 365 doses (for opioid-induced respiratory depresssion). In case of emergency (overdose), spray once into each nostril. If no response within 3 minutes, repeat application and call 911. 1 each 0   pantoprazole (PROTONIX) 40 MG tablet Take 1 tablet (40 mg total) by mouth daily before breakfast. 90 tablet 3   valACYclovir (VALTREX) 1000 MG tablet TAKE TWO TABLETS BY MOUTH TWICE DAILY FOR ONE DAY FEVER BLISTERTAKE TWO TABLETS BY MOUTH TWICE DAILY FOR ONE DAY FEVER BLISTER 4 tablet 5   dofetilide (TIKOSYN) 500 MCG capsule Take 1 capsule (500 mcg total) by mouth 2 (two) times daily. 60 capsule 4   No current facility-administered medications for this encounter.    Allergies  Allergen Reactions   Amoxicillin Hives    She did ok w ANCEF   Chlorhexidine Gluconate Dermatitis and Hives   Clindamycin Hives   Codeine Hives   Erythromycin Hives    "mycins" in general   Penicillin G Hives    "cillins" in general   Sulfa Antibiotics Nausea And Vomiting and Hives        Shellfish Allergy Hives   Decadron [Dexamethasone] Other (See Comments)    Hot flashes, insomnia, "manic"    Flecainide Nausea And Vomiting    Pts said she signs of going into heart failure. Edema   Papaya Derivatives Hives   Betadine [Povidone Iodine] Hives        Other Hives     Mango    Prednisone Anxiety    High blood pressure, flushed, mood changes, heart palpitations    ROS- All systems are reviewed and negative except as per the HPI above.  Physical Exam: Vitals:   05/18/23 1020  BP: (!) 124/90  Pulse: 76  Weight: 63.5 kg  Height: 5\' 3"  (1.6 m)     GEN- The patient is well appearing, alert and oriented x 3 today.   Neck - no JVD or carotid bruit noted Lungs- Clear to ausculation bilaterally, normal work of breathing Heart- Regular rate and rhythm, no murmurs, rubs or gallops, PMI not laterally displaced Extremities- no clubbing, cyanosis, or edema Skin - no rash or  ecchymosis noted    Wt Readings  from Last 3 Encounters:  05/18/23 63.5 kg  05/08/23 64.8 kg  05/08/23 64.8 kg    EKG today demonstrates  Vent. rate 76 BPM PR interval 148 ms QRS duration 80 ms QT/QTcB 394/443 ms P-R-T axes 75 37 -20 Normal sinus rhythm ST & T wave abnormality, consider anterior ischemia Abnormal ECG When compared with ECG of 11-May-2023 10:02, PREVIOUS ECG IS PRESENT  Echo 07/31/21: 1. Left ventricular ejection fraction, by estimation, is 55 to 60%. Left  ventricular ejection fraction by 2D MOD biplane is 55.3 %. The left  ventricle has normal function. The left ventricle has no regional wall  motion abnormalities. There is mild left  ventricular hypertrophy. Left ventricular diastolic parameters are  indeterminate.   2. Right ventricular systolic function is normal. The right ventricular  size is normal.   3. The mitral valve is normal in structure. Trivial mitral valve  regurgitation.   4. The aortic valve is tricuspid. Aortic valve regurgitation is trivial.    Epic records are reviewed at length today  CHA2DS2-VASc Score = 3  The patient's score is based upon: CHF History: 0 HTN History: 1 Diabetes History: 0 Stroke History: 0 Vascular Disease History: 1 Age Score: 0 Gender Score: 1      Chads 2 with HTN and age; no CAD?     ASSESSMENT AND PLAN: 1. Persistent Atrial Fibrillation/atrial flutter (typical and atypical) The patient's CHA2DS2-VASc score is 3, indicating a 3.2% annual risk of stroke.   S/p afib and flutter ablation 08/06/20 S/p Afib and flutter ablation on 12/04/22 by Dr. Lalla Brothers.  S/p Tikosyn admission 2/25-28/25.  She is currently in NSR. Continue Eliquis 5 mg BID without interruption. Tikosyn teaching reinforced.  Patient notes she lives about 10 minutes from [place] office. She would like to continue her Tikosyn surveillance there if possible and I think that's reasonable considering the proximity. She can follow with Afib clinic in [place] as  needed.  High risk medication monitoring (ICD10: [id number]) Patient requires ongoing monitoring for anti-arrhythmic medication which has the potential to cause life threatening arrhythmias or AV block. Qtc stable. Continue Tikosyn 500 mcg BID. Bmet and mag drawn today.  2. HTN Stable today.   3. Obstructive sleep apnea She uses her CPAP regularly.    Follow up as scheduled with Dr. Lalla Brothers for ~1 month Tikosyn surveillance.   Justin Mend, PA-C Afib Clinic [facility] [address] [place], [place] [address] [phone] 05/18/2023 10:53 AM

## 2023-05-25 ENCOUNTER — Inpatient Hospital Stay: Payer: Medicare Other

## 2023-05-25 ENCOUNTER — Encounter: Payer: Self-pay | Admitting: Internal Medicine

## 2023-05-25 ENCOUNTER — Inpatient Hospital Stay: Payer: Medicare Other | Attending: Internal Medicine

## 2023-05-25 ENCOUNTER — Inpatient Hospital Stay: Payer: Medicare Other | Admitting: Internal Medicine

## 2023-05-25 VITALS — BP 126/83 | HR 62 | Temp 98.0°F | Resp 18 | Ht 63.0 in | Wt 140.0 lb

## 2023-05-25 VITALS — BP 137/86 | HR 53

## 2023-05-25 DIAGNOSIS — D509 Iron deficiency anemia, unspecified: Secondary | ICD-10-CM | POA: Insufficient documentation

## 2023-05-25 DIAGNOSIS — E611 Iron deficiency: Secondary | ICD-10-CM

## 2023-05-25 LAB — BASIC METABOLIC PANEL
Anion gap: 8 (ref 5–15)
BUN: 8 mg/dL (ref 8–23)
CO2: 22 mmol/L (ref 22–32)
Calcium: 8.7 mg/dL — ABNORMAL LOW (ref 8.9–10.3)
Chloride: 105 mmol/L (ref 98–111)
Creatinine, Ser: 0.7 mg/dL (ref 0.44–1.00)
GFR, Estimated: >60 mL/min (ref >60)
Glucose, Bld: 120 mg/dL — ABNORMAL HIGH (ref 70–99)
Potassium: 4 mmol/L (ref 3.5–5.1)
Sodium: 135 mmol/L (ref 135–145)

## 2023-05-25 LAB — CBC WITH DIFFERENTIAL (CANCER CENTER ONLY)
Abs Immature Granulocytes: 0.02 10*3/uL (ref 0.00–0.07)
Basophils Absolute: 0.1 10*3/uL (ref 0.0–0.1)
Basophils Relative: 1 %
Eosinophils Absolute: 0.1 10*3/uL (ref 0.0–0.5)
Eosinophils Relative: 1 %
HCT: 37.5 % (ref 36.0–46.0)
Hemoglobin: 12.6 g/dL (ref 12.0–15.0)
Immature Granulocytes: 0 %
Lymphocytes Relative: 27 %
Lymphs Abs: 1.5 10*3/uL (ref 0.7–4.0)
MCH: 29.7 pg (ref 26.0–34.0)
MCHC: 33.6 g/dL (ref 30.0–36.0)
MCV: 88.4 fL (ref 80.0–100.0)
Monocytes Absolute: 0.2 10*3/uL (ref 0.1–1.0)
Monocytes Relative: 4 %
Neutro Abs: 3.6 10*3/uL (ref 1.7–7.7)
Neutrophils Relative %: 67 %
Platelet Count: 314 10*3/uL (ref 150–400)
RBC: 4.24 MIL/uL (ref 3.87–5.11)
RDW: 12.8 % (ref 11.5–15.5)
WBC Count: 5.4 10*3/uL (ref 4.0–10.5)
nRBC: 0 % (ref 0.0–0.2)

## 2023-05-25 LAB — IRON AND TIBC
Iron: 70 ug/dL (ref 28–170)
Saturation Ratios: 19 % (ref 10.4–31.8)
TIBC: 374 ug/dL (ref 250–450)
UIBC: 304 ug/dL

## 2023-05-25 LAB — FERRITIN: Ferritin: 38 ng/mL (ref 11–307)

## 2023-05-25 MED ORDER — IRON SUCROSE 20 MG/ML IV SOLN
200.0000 mg | Freq: Once | INTRAVENOUS | Status: AC
  Administered 2023-05-25: 200 mg via INTRAVENOUS
  Filled 2023-05-25: qty 10

## 2023-05-25 NOTE — Progress Notes (Signed)
 Patient complains of occasional chest pain & SOB, with no other concerns.

## 2023-05-25 NOTE — Progress Notes (Signed)
 [facility] CONSULT NOTE  Patient Care Team: Lanier Prude, MD as PCP - General (Cardiology) Lanier Prude, MD as PCP - Electrophysiology (Cardiology) Debbe Odea, MD as PCP - Cardiology (Cardiology) Mila Palmer Benjaman Pott, NP as Nurse Practitioner (Psychiatry) Midge Minium, MD as Consulting Physician (Gastroenterology) Leafy Ro, MD as Consulting Physician (General Surgery) Delano Metz, MD as Referring Physician (Pain Medicine) Pcp, No Sterling Big, DMD (Dentistry) Earna Coder, MD as Consulting Physician (Internal Medicine) Anthonette Legato, OD (Optometry) Erin Fulling, MD as Consulting Physician (Pulmonary Disease)  CHIEF COMPLAINTS/PURPOSE OF CONSULTATION: ANEMIA  HEMATOLOGY HISTORY:  #Iron deficiency ANEMIA EGD-gastritis/ colonoscopy- lymphoplasmacytic infiltrateNovember 2022 [Dr. Allegra Lai; in hospital]; capsule/Bone marrow Biopsy-none; [history of chronic iron deficiency anemia- s/p IV iron infusions in the past]; CTA CAP: Negative for any source of bleeding noted.  Atrophic pancreas-?  Etiology;   #Question pancreatic insufficiency-on Creon [nov 2022]  #A. Fib [s/p cardioversion-Eliquis Dr.Agbor]; #Chronic back pain/pain management    Latest Reference Range & Units 01/14/21 06:15  Iron 28 - 170 ug/dL 30  UIBC ug/dL 536  TIBC 644 - 034 ug/dL 742 (H)  Saturation Ratios 10.4 - 31.8 % 6 (L)  Ferritin 11 - 307 ng/mL 7 (L)  Folate >5.9 ng/mL 14.9    HISTORY OF PRESENTING ILLNESS: Alone ambulating independently.  [name] 63 y.o.  female iron deficient anemia unclear etiology- possible Hx of Interstitial cystis; Hx of A-fib on Eliquis  here for follow-up.  Patient complains of occasional chest pain & SOB, with no other concern.  Patient was recently admitted to hospital started on Tikosyn for A-fib.  NO blood in stools. Chronic intermittent blood in urine.  Gaining weight. Patient not on oral iron because of  intolerance.  Chronic constipation/diarrhea-followed by GI.   Review of Systems  Constitutional:  Positive for malaise/fatigue. Negative for chills, diaphoresis, fever and weight loss.  HENT:  Negative for nosebleeds and sore throat.   Eyes:  Negative for double vision.  Respiratory:  Negative for cough, hemoptysis, sputum production, shortness of breath and wheezing.   Cardiovascular:  Negative for chest pain, palpitations, orthopnea and leg swelling.  Gastrointestinal:  Positive for diarrhea and nausea. Negative for abdominal pain, blood in stool, constipation, heartburn, melena and vomiting.  Genitourinary:  Negative for dysuria, frequency and urgency.  Musculoskeletal:  Negative for back pain and joint pain.  Skin: Negative.  Negative for itching and rash.  Neurological:  Negative for dizziness, tingling, focal weakness, weakness and headaches.  Endo/Heme/Allergies:  Does not bruise/bleed easily.  Psychiatric/Behavioral:  Negative for depression. The patient is not nervous/anxious and does not have insomnia.     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    Chronic headaches    Chronic, continuous use of opioids    DDD (degenerative disc disease)    CERVICAL AND LUMBAR-CHRONIC PAIN, RT HIP LABRAL TEAR   Depression    PT STATES A LOT OF STRESS IN HER LIFE   Dissociative disorder    Dizziness 04/22/2013  Duodenal ulcer with hemorrhage and perforation (HCC) 04/27/2003   Foot drop, right    FROM BACK SURGERY   GERD (gastroesophageal reflux disease)    H/O arthrodesis 08/18/2014   Headache(784.0)     AND NECK PAIN--STATES RECENT TEST SHOW CERVICAL DEGENERATION   History of blood transfusion    s/p back surgery   History of cardiac cath    a. 05/2019 Cath: Nl cors. EF 55-65%.   History of cardioversion    History of cervical spinal surgery 01/04/2015   History of kidney stones    Hypertension    Inverted T wave    Iron deficiency 02/01/2021   Mitral regurgitation    a. 07/2020 Echo: EF 60-65%, no rwma. Nl RV size/fxn. RVSP 39.64mmHg. Mildly to mod dil LA. Mod MR; b. 07/2020 TEE: EF 55-60%. Lambl's excresence. Nl RV size/fxn. Midly dil LA. No LA/LAA thrombus. Mild MR.   Narrowing of intervertebral disc space 08/18/2014   Currently on disability.    Orthostatic hypotension 04/22/2013   Pain    CHRONIC NECK AND BACK PAIN - LIMITED ROM NECK - S/P FUSIONS CERVICAL AND LUMBAR   Paroxysmal atrial fibrillation (HCC) 2021   a. 07/2020 s/p PVI.   Pneumonia    PONV (postoperative nausea and vomiting)    PT GIVES HX OF N&V AND FEVER WITH SURGERIES YEARS AGO--BUT NO PROBLEMS WITH MORE RECENT SURGERIES--STATES NOT MALIGNANT HYPERTHERMIA   Postop Hyponatremia 05/14/2012   Postoperative anemia due to acute blood loss 05/14/2012   PTSD (post-traumatic stress disorder)    Right foot drop    Right hip arthralgia 08/18/2014   Status post surgery of right, and now needs left.    Sleep apnea 2021   does not have a cpap   Therapeutic opioid-induced constipation (OIC)    Typical atrial flutter (HCC)     SURGICAL HISTORY: Past Surgical History:  Procedure Laterality Date   ABDOMINAL HYSTERECTOMY     afib  05/2019   ANTERIOR CERVICAL DECOMP/DISCECTOMY FUSION N/A 10/30/2012   Procedure: ACDF C5-6, EXPLORATION AND HARDWARE REMOVAL C6-7;  Surgeon: Venita Lick, MD;  Location: [facility];  Service: Orthopedics;  Laterality: N/A;   ANTERIOR FUSION CERVICAL SPINE  MAY 2012   AT [facility]   ATRIAL FIBRILLATION ABLATION N/A 08/06/2020   Procedure: ATRIAL FIBRILLATION ABLATION;  Surgeon: Lanier Prude, MD;   Location: [facility];  Service: Cardiovascular;  Laterality: N/A;   ATRIAL FIBRILLATION ABLATION N/A 12/04/2022   Procedure: ATRIAL FIBRILLATION ABLATION;  Surgeon: Lanier Prude, MD;  Location: [facility];  Service: Cardiovascular;  Laterality: N/A;   BACK SURGERY  2009   LUMBAR FUSION WITH RODS    BREAST BIOPSY Right 2008   benign.- bx/clip   BREAST EXCISIONAL BIOPSY Left 1998   benign   BREAST REDUCTION SURGERY Bilateral 06/2016   CARDIAC ELECTROPHYSIOLOGY MAPPING AND ABLATION     CARDIOVERSION N/A 12/03/2019   Procedure: CARDIOVERSION;  Surgeon: Debbe Odea, MD;  Location: [facility] ORS;  Service: Cardiovascular;  Laterality: N/A;   CARDIOVERSION N/A 08/01/2021   Procedure: CARDIOVERSION;  Surgeon: Debbe Odea, MD;  Location: [facility];  Service: Cardiovascular;  Laterality: N/A;   CARDIOVERSION N/A 08/05/2021   Procedure: CARDIOVERSION;  Surgeon: Antonieta Iba, MD;  Location: [facility];  Service: Cardiovascular;  Laterality: N/A;   CARPAL TUNNEL RELEASE  05-06-12   Right   CHOLECYSTECTOMY     COLONOSCOPY WITH PROPOFOL N/A 01/26/2017   Procedure: COLONOSCOPY WITH PROPOFOL;  Surgeon: Midge Minium, MD;  Location: [facility];  Service: Endoscopy;  Laterality: N/A;   COLONOSCOPY WITH PROPOFOL N/A 01/16/2021   Procedure: COLONOSCOPY WITH PROPOFOL;  Surgeon: Toney Reil, MD;  Location: [facility] END[facility]COPY;  Service: Gastroenterology;  Laterality: N/A;   DIAGNOSTIC LAPAROSCOPIES - MULTIPLE FOR ENDOMETRIOSIS     ESOPHAGEAL DILATION N/A 01/26/2017   Procedure: ESOPHAGEAL DILATION;  Surgeon: Midge Minium, MD;  Location: [facility];  Service: Endoscopy;  Laterality: N/A;   ESOPHAGEAL MANOMETRY N/A 05/30/2017   Procedure: ESOPHAGEAL MANOMETRY (EM);  Surgeon: Midge Minium, MD;  Location: [facility] ENDOSCOPY;  Service: Endoscopy;  Laterality: N/A;   ESOPHAGOGASTRODUODENOSCOPY (EGD) WITH PROPOFOL N/A 01/26/2017   Procedure: ESOPHAGOGASTRODUODENOSCOPY (EGD)  WITH PROPOFOL;  Surgeon: Midge Minium, MD;  Location: [facility];  Service: Endoscopy;  Laterality: N/A;   ESOPHAGOGASTRODUODENOSCOPY (EGD) WITH PROPOFOL N/A 01/30/2020   Procedure: ESOPHAGOGASTRODUODENOSCOPY (EGD) WITH PROPOFOL;  Surgeon: Midge Minium, MD;  Location: [facility];  Service: Endoscopy;  Laterality: N/A;  sleep apnea COVID + 01-15-20   ESOPHAGOGASTRODUODENOSCOPY (EGD) WITH PROPOFOL N/A 01/15/2021   Procedure: ESOPHAGOGASTRODUODENOSCOPY (EGD) WITH PROPOFOL;  Surgeon: Toney Reil, MD;  Location: [facility] ENDOSCOPY;  Service: Gastroenterology;  Laterality: N/A;   ESOPHAGOGASTRODUODENOSCOPY (EGD) WITH PROPOFOL N/A 12/07/2021   Procedure: ESOPHAGOGASTRODUODENOSCOPY (EGD) WITH PROPOFOL;  Surgeon: Toney Reil, MD;  Location[facility];  Service: Gastroenterology;  Laterality: N/A;   HIP ARTHROSCOPY  09/20/2011   Procedure: ARTHROSCOPY HIP;  Surgeon: Loanne Drilling, MD;  Location: [facility];  Service: Orthopedics;  Laterality: Right;  Right Hip Scope with Labral Debridement   LEFT HEART CATH AND CORONARY ANGIOGRAPHY Left 06/10/2019   Procedure: LEFT HEART CATH AND CORONARY ANGIOGRAPHY;  Surgeon: Yvonne Kendall, MD;  Location: [facility];  Service: Cardiovascular;  Laterality: Left;   LOWER EXTREMITY ANGIOGRAPHY Left 01/06/2022   Procedure: Lower Extremity Angiography;  Surgeon: Renford Dills, MD;  Location: [facility];  Service: Cardiovascular;  Laterality: Left;   NASAL SEPTUM SURGERY  MARCH 2013   IN [place]   [facility] IMPEDANCE STUDY N/A 05/30/2017   Procedure: PH IMPEDANCE STUDY;  Surgeon: Midge Minium, MD;  Location: [facility] ENDOSCOPY;  Service: Endoscopy;  Laterality: N/A;   POLYPECTOMY N/A 01/26/2017   Procedure: POLYPECTOMY;  Surgeon: Midge Minium, MD;  Location: [facility];  Service: Endoscopy;  Laterality: N/A;   POSTERIOR CERVICAL FUSION/FORAMINOTOMY N/A 04/16/2013   Procedure: REMOVAL CERVICAL PLATES AND INTERBODY  CAGE/POSTERIOR CERVICAL SPINAL FUSION C4 - C6/C5 CORPECTOMY/C4 - C6 FUSION WITH ILIAC CREST BONE GRAFT;  Surgeon: Venita Lick, MD;  Location: [facility];  Service: Orthopedics;  Laterality: N/A;   RADIOFREQUENCY ABLATION NERVES     REDUCTION MAMMAPLASTY Bilateral 05/2016   RIGHT HIP ARTHROSCOPY FOR LABRAL TEAR  ABOUT 2010   2012 also   SHOULDER ARTHROSCOPY  05-06-12   bone spur   TEE WITHOUT CARDIOVERSION N/A 08/05/2020   Procedure: TRANSESOPHAGEAL ECHOCARDIOGRAM (TEE);  Surgeon: Lewayne Bunting, MD;  Location: [facility] ENDOSCOPY;  Service: Cardiovascular;  Laterality: N/A;   TONSILLECTOMY     AS A CHILD   TOTAL HIP ARTHROPLASTY Right 05/13/2012   Procedure: TOTAL HIP ARTHROPLASTY ANTERIOR APPROACH;  Surgeon: Loanne Drilling, MD;  Location: [facility];  Service: Orthopedics;  Laterality: Right;    SOCIAL HISTORY: Social History   Socioeconomic History   Marital status: Married    Spouse name: bruce   Number of children: 2   Years of education: Not on file   Highest education level: Associate degree: occupational, Scientist, product/process development,  or vocational program  Occupational History    Comment: disabled  Tobacco Use   Smoking status: Never   Smokeless tobacco: Never   Tobacco comments:    Never smoked 01/01/23  Vaping Use   Vaping status: Never Used  Substance and Sexual Activity   Alcohol use: No    Alcohol/week: 0.0 standard drinks of alcohol   Drug use: Yes    Comment: prescribed morphine and xanax   Sexual activity: Not Currently  Other Topics Concern   Not on file  Social History Narrative   Son, daughter  And 4 grandkids; raises 3 of the grandchildren      Lives in [place]; with husband; never smoked; no alcohol; 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    Social Drivers of Corporate investment banker Strain: Low Risk  (09/20/2022)   Overall Financial Resource Strain (CARDIA)    Difficulty of Paying Living Expenses: Not hard at all  Food Insecurity: No Food Insecurity (05/08/2023)   Hunger Vital Sign     Worried About Running Out of Food in the Last Year: Never true    Ran Out of Food in the Last Year: Never true  Transportation Needs: No Transportation Needs (05/08/2023)   PRAPARE - Administrator, Civil Service (Medical): No    Lack of Transportation (Non-Medical): No  Physical Activity: Insufficiently Active (09/20/2022)   Exercise Vital Sign    Days of Exercise per Week: 2 days    Minutes of Exercise per Session: 20 min  Stress: Stress Concern Present (09/20/2022)   [employer] of Occupational Health - Occupational Stress Questionnaire    Feeling of Stress : Rather much  Social Connections: Socially Integrated (05/08/2023)   Social Connection and Isolation Panel [NHANES]    Frequency of Communication with Friends and Family: Twice a week    Frequency of Social Gatherings with Friends and Family: Twice a week    Attends Religious Services: 1 to 4 times per year    Active Member of [employer] or Organizations: No    Attends Engineer, structural: 1 to 4 times per year    Marital Status: Married  Catering manager Violence: Patient Declined (05/08/2023)   Humiliation, Afraid, Rape, and Kick questionnaire    Fear of Current or Ex-Partner: Patient declined    Emotionally Abused: Patient declined    Physically Abused: Patient declined    Sexually Abused: Patient declined    FAMILY HISTORY: Family History  Problem Relation Age of Onset   Aneurysm Mother    Bipolar disorder Sister    Aneurysm Maternal Grandmother    Breast cancer Paternal Grandmother 73   Bipolar disorder Grandchild    Anxiety disorder Grandchild    Depression Grandchild    Cancer Neg Hx     ALLERGIES:  is allergic to amoxicillin, chlorhexidine gluconate, clindamycin, codeine, erythromycin, penicillin g, sulfa antibiotics, shellfish allergy, decadron [dexamethasone], flecainide, papaya derivatives, betadine [povidone iodine], other, and prednisone.  MEDICATIONS:  Current Outpatient Medications   Medication Sig Dispense Refill   acetaminophen (TYLENOL) 325 MG tablet Take 2 tablets (650 mg total) by mouth every 6 (six) hours as needed for mild pain (or Fever >/= 101). (Patient taking differently: Take 1,000 mg by mouth as needed for mild pain (pain score 1-3) (or Fever >/= 101).)     ALPRAZolam (XANAX) 1 MG tablet Take 1 mg by mouth at bedtime.      cholecalciferol (VITAMIN D3) 25 MCG (1000 UNIT) tablet Take 1,000 Units by  mouth daily.     dofetilide (TIKOSYN) 500 MCG capsule Take 1 capsule (500 mcg total) by mouth 2 (two) times daily. 60 capsule 4   ELIQUIS 5 MG TABS tablet TAKE 1 TABLET BY MOUTH TWICE A DAY 60 tablet 5   linaclotide (LINZESS) 290 MCG CAPS capsule Take 1 capsule (290 mcg total) by mouth daily before breakfast. 30 capsule 5   Mometasone Furoate (ALLERGY NASAL SPRAY NA) Place 1 spray into the nose daily as needed (allergies/ decongestion).     montelukast (SINGULAIR) 10 MG tablet TAKE 1 TABLET BY MOUTH EVERY DAY (Patient taking differently: Take 10 mg by mouth at bedtime.) 90 tablet 1   morphine (MSIR) 15 MG tablet Take 1 tablet (15 mg total) by mouth every 6 (six) hours as needed for moderate pain (pain score 4-6) or severe pain (pain score 7-10). Must last 30 days. 120 tablet 0   naloxone (NARCAN) nasal spray 4 mg/0.1 mL Place 1 spray into the nose as needed for up to 365 doses (for opioid-induced respiratory depresssion). In case of emergency (overdose), spray once into each nostril. If no response within 3 minutes, repeat application and call 911. 1 each 0   pantoprazole (PROTONIX) 40 MG tablet Take 1 tablet (40 mg total) by mouth daily before breakfast. 90 tablet 3   valACYclovir (VALTREX) 1000 MG tablet TAKE TWO TABLETS BY MOUTH TWICE DAILY FOR ONE DAY FEVER BLISTERTAKE TWO TABLETS BY MOUTH TWICE DAILY FOR ONE DAY FEVER BLISTER 4 tablet 5   No current facility-administered medications for this visit.   Facility-Administered Medications Ordered in Other Visits   Medication Dose Route Frequency Provider Last Rate Last Admin   iron sucrose (VENOFER) injection 200 mg  200 mg Intravenous Once Louretta Shorten R, MD          PHYSICAL EXAMINATION:   Vitals:   05/25/23 1333  BP: 126/83  Pulse: 62  Resp: 18  Temp: 98 F (36.7 C)  SpO2: 100%     Filed Weights   05/25/23 1333  Weight: 140 lb (63.5 kg)    Irregular rhythm.  Physical Exam Vitals and nursing note reviewed.  HENT:     Head: Normocephalic and atraumatic.     Mouth/Throat:     Pharynx: Oropharynx is clear.  Eyes:     Extraocular Movements: Extraocular movements intact.     Pupils: Pupils are equal, round, and reactive to light.  Cardiovascular:     Rate and Rhythm: Normal rate.  Pulmonary:     Comments: Decreased breath sounds bilaterally.  Abdominal:     Palpations: Abdomen is soft.  Musculoskeletal:        General: Normal range of motion.     Cervical back: Normal range of motion.  Skin:    General: Skin is warm.  Neurological:     General: No focal deficit present.     Mental Status: She is alert and oriented to person, place, and time.  Psychiatric:        Behavior: Behavior normal.        Judgment: Judgment normal.     LABORATORY DATA:  I have reviewed the data as listed Lab Results  Component Value Date   WBC 5.4 05/25/2023   HGB 12.6 05/25/2023   HCT 37.5 05/25/2023   MCV 88.4 05/25/2023   PLT 314 05/25/2023   Recent Labs    02/20/23 1444 04/30/23 1358 05/11/23 0440 05/18/23 1013 05/25/23 1324  NA 138   < > 139 138  135  K 4.2   < > 4.9 4.0 4.0  CL 101   < > 106 103 105  CO2 24   < > 23 23 22   GLUCOSE 97   < > 105* 112* 120*  BUN 6*   < > 11 5* 8  CREATININE 0.71   < > 0.75 0.84 0.70  CALCIUM 9.2   < > 9.4 9.6 8.7*  GFRNONAA  --    < > >60 >60 >60  PROT 6.5  --   --   --   --   ALBUMIN 4.0  --   --   --   --   AST 17  --   --   --   --   ALT 9  --   --   --   --   ALKPHOS 146*  145*  --   --   --   --   BILITOT 0.4  --   --    --   --    < > = values in this interval not displayed.     LONG TERM MONITOR (3-14 DAYS) Result Date: 05/01/2023 HR 46 - 179 bpm, average 61 bpm. 117 nonsustained SVT, longest 17 beats. Occasional supraventricular ectopy, 3.8% Rare ventricular ectopy. No sustained arrhythmias. No atrial fibrillation. Sheria Lang T. Lalla Brothers, MD, [facility], [facility] Cardiac Electrophysiology    Iron deficiency # Anemia- iron deficiency-[NOV 2022] hemoglobin:11 Ferritin-7 .  Poor tolerance to p.o. iron. today hemoglobin is 12- proceed with infusion.   # Etiology: Unclear etiology- ? Interstitial cystitis- Status post EGD/colonoscopy-2022-monitor for now.  # IBS-Diarrhea-?  Atrophic pancreas/pancreatic insufficiency on Creon; as per GI-   # A.fib 5 mg BID [Dr.Agbor]s/p ablation #3 in oct 2024. rate controlled currently on Tikosyn [feb 2025].- stable.  # Hypocalcemia-8.7; continue dietary supplementation. Patient poor tolerance to calcium plus vitamin D. C 2025- LOW_ on vit D supp  # DISPOSITION: # Venofer  # follow up in 6 months- MD: labs- cbc/bmp;iron studies/ferritin- possible IV venofer-Dr.B  All questions were answered. The patient knows to call the clinic with any problems, questions or concerns.    Earna Coder, MD 05/25/2023 2:00 PM

## 2023-05-25 NOTE — Progress Notes (Signed)
Pt tolerated treatment without complaints.  VSS.  Pt refused 30 minute post observation.  Pt understands risks.   

## 2023-05-25 NOTE — Assessment & Plan Note (Addendum)
#   Anemia- iron deficiency-[NOV 2022] hemoglobin:11 Ferritin-7 .  Poor tolerance to p.o. iron. today hemoglobin is 12- proceed with infusion.   # Etiology: Unclear etiology- ? Interstitial cystitis- Status post EGD/colonoscopy-2022-monitor for now.  # IBS-Diarrhea-?  Atrophic pancreas/pancreatic insufficiency on Creon; as per GI-   # A.fib 5 mg BID [Dr.Agbor]s/p ablation #3 in oct 2024. rate controlled currently on Tikosyn [feb 2025].- stable.  # Hypocalcemia-8.7; continue dietary supplementation. Patient poor tolerance to calcium plus vitamin D. C 2025- LOW_ on vit D supp  # DISPOSITION: # Venofer  # follow up in 6 months- MD: labs- cbc/bmp;iron studies/ferritin- possible IV venofer-Dr.B

## 2023-05-25 NOTE — Patient Instructions (Signed)

## 2023-05-27 ENCOUNTER — Other Ambulatory Visit: Payer: Self-pay | Admitting: Medical

## 2023-05-28 NOTE — Telephone Encounter (Signed)
 This is a Educational psychologist pt

## 2023-06-04 ENCOUNTER — Ambulatory Visit (INDEPENDENT_AMBULATORY_CARE_PROVIDER_SITE_OTHER): Admitting: Family Medicine

## 2023-06-04 ENCOUNTER — Encounter: Payer: Self-pay | Admitting: Family Medicine

## 2023-06-04 VITALS — BP 132/75 | HR 66 | Ht 63.0 in | Wt 140.0 lb

## 2023-06-04 DIAGNOSIS — I4819 Other persistent atrial fibrillation: Secondary | ICD-10-CM

## 2023-06-04 DIAGNOSIS — R1084 Generalized abdominal pain: Secondary | ICD-10-CM | POA: Diagnosis not present

## 2023-06-04 DIAGNOSIS — R1310 Dysphagia, unspecified: Secondary | ICD-10-CM

## 2023-06-04 DIAGNOSIS — K219 Gastro-esophageal reflux disease without esophagitis: Secondary | ICD-10-CM

## 2023-06-04 MED ORDER — DEXLANSOPRAZOLE 60 MG PO CPDR: 60.0000 mg | DELAYED_RELEASE_CAPSULE | Freq: Every day | ORAL | 0 refills | Status: DC

## 2023-06-04 NOTE — Progress Notes (Signed)
 Established patient visit   Patient: [name]   DOB: Aug 22, 1960   63 y.o. Female  MRN: [id number] Visit Date: 06/04/2023  Today's healthcare provider: Sherlyn Hay, DO   Chief Complaint  Patient presents with   Abdominal Pain    Possible stomach stomach ulcer coming, she has a history of ulcers, she started a new heart medication that could be causing the problem    Subjective    Abdominal Pain Associated symptoms include constipation and nausea. Pertinent negatives include no diarrhea or vomiting (due in part to stomach placement/prior surgery).   [name] is a 63 year old female with a history of stomach ulcers who presents with worsening abdominal pain.  She has a long-standing history of stomach ulcers, first occurring at age 31, and has experienced intermittent abdominal pain throughout her life. Recently, her abdominal pain has worsened significantly, described as severe with a rating of eight or nine out of ten. The pain is diffuse, feels like 'inflammation', and is exacerbated by eating any food, even a cracker, leading her to eat minimally to avoid pain.  She was previously on dexlansoprazole for GERD, which was effective, but was switched to Protonix during a recent hospital admission. Protonix has not alleviated her symptoms, and she is concerned that Tikosyn, started around the same time her pain worsened, may be contributing to her symptoms. She has been advised to avoid antacids like Tums and Pepcid due to potential interactions with Tikosyn.  She has a history of GERD and underwent a surgical procedure to wrap her stomach around her esophagus. Despite this, she continues to experience reflux, with food and pills regurgitating into her mouth, sometimes with a foamy consistency. She also reports dysphagia, with food getting stuck and difficulty swallowing.  She has IBS-C and is currently taking Linzess, which provides inconsistent relief. She was  previously prescribed a medication containing naloxone for constipation but did not take it due to concerns about withdrawal symptoms from her chronic morphine use for pain management.  She experiences chronic pain and takes morphine four times a day due to being 'full of metal everywhere' from multiple surgeries. This regimen is part of her chronic pain management strategy.  She experiences chronic fatigue and receives iron infusions at a cancer center due to poor absorption of electrolytes. She has a history of low calcium and D3 levels, for which she takes vitamin D3 supplements, and has had episodes of low magnesium and potassium, requiring supplementation during hospital stays. No vomiting but experiences nausea.     Medications: Outpatient Medications Prior to Visit  Medication Sig   acetaminophen (TYLENOL) 325 MG tablet Take 2 tablets (650 mg total) by mouth every 6 (six) hours as needed for mild pain (or Fever >/= 101). (Patient taking differently: Take 1,000 mg by mouth as needed for mild pain (pain score 1-3) (or Fever >/= 101).)   ALPRAZolam (XANAX) 1 MG tablet Take 1 mg by mouth at bedtime.    cholecalciferol (VITAMIN D3) 25 MCG (1000 UNIT) tablet Take 1,000 Units by mouth daily.   dofetilide (TIKOSYN) 500 MCG capsule Take 1 capsule (500 mcg total) by mouth 2 (two) times daily.   ELIQUIS 5 MG TABS tablet TAKE 1 TABLET BY MOUTH TWICE A DAY   linaclotide (LINZESS) 290 MCG CAPS capsule Take 1 capsule (290 mcg total) by mouth daily before breakfast.   Mometasone Furoate (ALLERGY NASAL SPRAY NA) Place 1 spray into the nose daily as needed (  allergies/ decongestion).   montelukast (SINGULAIR) 10 MG tablet TAKE 1 TABLET BY MOUTH EVERY DAY (Patient taking differently: Take 10 mg by mouth at bedtime.)   morphine (MSIR) 15 MG tablet Take 1 tablet (15 mg total) by mouth every 6 (six) hours as needed for moderate pain (pain score 4-6) or severe pain (pain score 7-10). Must last 30 days.    naloxone (NARCAN) nasal spray 4 mg/0.1 mL Place 1 spray into the nose as needed for up to 365 doses (for opioid-induced respiratory depresssion). In case of emergency (overdose), spray once into each nostril. If no response within 3 minutes, repeat application and call 911.   valACYclovir (VALTREX) 1000 MG tablet TAKE TWO TABLETS BY MOUTH TWICE DAILY FOR ONE DAY FEVER BLISTERTAKE TWO TABLETS BY MOUTH TWICE DAILY FOR ONE DAY FEVER BLISTER   [DISCONTINUED] pantoprazole (PROTONIX) 40 MG tablet Take 1 tablet (40 mg total) by mouth daily before breakfast.   No facility-administered medications prior to visit.    Review of Systems  Gastrointestinal:  Positive for abdominal pain, constipation and nausea. Negative for blood in stool, diarrhea and vomiting (due in part to stomach placement/prior surgery).        Objective    BP 132/75   Pulse 66   Ht 5\' 3"  (1.6 m)   Wt 140 lb (63.5 kg)   SpO2 100%   BMI 24.80 kg/m     Physical Exam Vitals and nursing note reviewed.  Constitutional:      General: She is not in acute distress.    Appearance: Normal appearance.  HENT:     Head: Normocephalic and atraumatic.  Eyes:     General: No scleral icterus.    Conjunctiva/sclera: Conjunctivae normal.  Cardiovascular:     Rate and Rhythm: Normal rate.  Pulmonary:     Effort: Pulmonary effort is normal.  Neurological:     Mental Status: She is alert and oriented to person, place, and time. Mental status is at baseline.  Psychiatric:        Mood and Affect: Mood normal.        Behavior: Behavior normal.      No results found for any visits on 06/04/23.  Assessment & Plan    Chronic GERD Assessment & Plan: Gastroesophageal Reflux Disease (GERD) with suspected peptic ulcer Chronic GERD exacerbated by medication change. Severe postprandial abdominal pain and dysphagia. Pantoprazole may worsen symptoms and interact with Tikosyn. Dexlansoprazole preferred for lower QT prolongation risk and  better control. Neither medication listed in Clinical Key drug monograph contraindications/drug-drug interactions. - Discontinue pantoprazole. - Prescribe dexlansoprazole. - Refer to alternative gastroenterologist in [facility]. - Advise dexlansoprazole intake closer to meals (30-60 minutes before rather than 5 hours before). - Monitor symptoms for 1-2 weeks post-medication change.  Orders: -     Dexlansoprazole; Take 1 capsule (60 mg total) by mouth daily.  Dispense: 30 capsule; Refill: 0 -     Ambulatory referral to Gastroenterology  Dysphagia, unspecified type Assessment & Plan: Refer to alternative gastroenterologist.  Orders: -     Ambulatory referral to Gastroenterology  Generalized abdominal pain -     Ambulatory referral to Gastroenterology  Persistent atrial fibrillation [facility]) Assessment & Plan: On Tikosyn, managed by cardiology; defer to specialist management.   Irritable Bowel Syndrome with Constipation (IBS-C) IBS-C managed with Linzess, inconsistent relief. Naloxone contraindicated due to morphine use. - Continue Linzess. - Avoid naloxone-containing medications.  Chronic pain Chronic pain managed with morphine, in part due to metal  implants. - Continue morphine regimen. Defer to pain management specialist   Return if symptoms worsen or fail to improve.      I discussed the assessment and treatment plan with the patient  The patient was provided an opportunity to ask questions and all were answered. The patient agreed with the plan and demonstrated an understanding of the instructions.   The patient was advised to call back or seek an in-person evaluation if the symptoms worsen or if the condition fails to improve as anticipated.    Sherlyn Hay, DO  [facility] [phone] (phone) [phone] (fax)  [facility]

## 2023-06-04 NOTE — Assessment & Plan Note (Signed)
 Gastroesophageal Reflux Disease (GERD) with suspected peptic ulcer Chronic GERD exacerbated by medication change. Severe postprandial abdominal pain and dysphagia. Pantoprazole may worsen symptoms and interact with Tikosyn. Dexlansoprazole preferred for lower QT prolongation risk and better control. Neither medication listed in Clinical Key drug monograph contraindications/drug-drug interactions. - Discontinue pantoprazole. - Prescribe dexlansoprazole. - Refer to alternative gastroenterologist in [facility]. - Advise dexlansoprazole intake closer to meals (30-60 minutes before rather than 5 hours before). - Monitor symptoms for 1-2 weeks post-medication change.

## 2023-06-04 NOTE — Assessment & Plan Note (Signed)
 On Tikosyn, managed by cardiology; defer to specialist management.

## 2023-06-04 NOTE — Assessment & Plan Note (Signed)
 Refer to alternative gastroenterologist.

## 2023-06-05 NOTE — Progress Notes (Unsigned)
  Electrophysiology Office Follow up Visit Note:    Date:  06/06/2023   ID:  [name], DOB 09-25-1960, MRN [id number]  PCP:  Erasmo Downer, MD  [facility] Cardiologist:  Debbe Odea, MD  [facility] Electrophysiologist:  Lanier Prude, MD    Interval History:     [name] is a 63 y.o. female who presents for a follow up visit.   The patient was admitted for Tikosyn February 25 through 28.  She was discharged on 500 mcg by mouth twice daily of Tikosyn.  She continues to take Eliquis and Tikosyn.  She saw Jonny Ruiz in the A-fib clinic May 18, 2023.  At that appointment she reported 2 brief episodes of palpitations after hospital discharge.  Today she is doing well.  No recurrence of atrial fibrillation.  Does have some peptic ulcer type abdominal pain at times after taking the Tikosyn.  She has recently been started on a proton pump inhibitor to see if this helps.         Past medical, surgical, social and family history were reviewed.  ROS:   Please see the history of present illness.    All other systems reviewed and are negative.  EKGs/Labs/Other Studies Reviewed:    The following studies were reviewed today:     EKG Interpretation Date/Time:  Wednesday June 06 2023 13:24:56 EDT Ventricular Rate:  68 PR Interval:  124 QRS Duration:  80 QT Interval:  430 QTC Calculation: 457 R Axis:   10  Text Interpretation: Normal sinus rhythm Confirmed by Steffanie Dunn [phone]) on 06/06/2023 1:50:14 PM    Physical Exam:    VS:  BP 110/72   Pulse 68   Ht 5\' 3"  (1.6 m)   Wt 140 lb 6.4 oz (63.7 kg)   SpO2 98%   BMI 24.87 kg/m     Wt Readings from Last 3 Encounters:  06/06/23 140 lb 6.4 oz (63.7 kg)  06/04/23 140 lb (63.5 kg)  05/25/23 140 lb (63.5 kg)     GEN: no distress CARD: RRR, No MRG RESP: No IWOB. CTAB.      ASSESSMENT:    1. Persistent atrial fibrillation (HCC)   2. Atrial flutter, unspecified type (HCC)   3.  Encounter for long-term (current) use of high-risk medication   4. Primary hypertension    PLAN:    In order of problems listed above:  #Persistent atrial fibrillation #High risk med monitoring-Tikosyn Doing well on Tikosyn Continue Eliquis for stroke prophylaxis Continue Tikosyn at the current dose QTc acceptable for ongoing Tikosyn continued  Follow-up 4 months with EP APP  Signed, Steffanie Dunn, MD, [facility], [facility] 06/06/2023 1:56 PM    Electrophysiology [facility]

## 2023-06-06 ENCOUNTER — Other Ambulatory Visit: Payer: Self-pay | Admitting: Family Medicine

## 2023-06-06 ENCOUNTER — Ambulatory Visit: Payer: Medicare Other | Attending: Cardiology | Admitting: Cardiology

## 2023-06-06 ENCOUNTER — Encounter: Payer: Self-pay | Admitting: Cardiology

## 2023-06-06 VITALS — BP 110/72 | HR 68 | Ht 63.0 in | Wt 140.4 lb

## 2023-06-06 DIAGNOSIS — I4819 Other persistent atrial fibrillation: Secondary | ICD-10-CM | POA: Diagnosis not present

## 2023-06-06 DIAGNOSIS — I4892 Unspecified atrial flutter: Secondary | ICD-10-CM

## 2023-06-06 DIAGNOSIS — J45909 Unspecified asthma, uncomplicated: Secondary | ICD-10-CM

## 2023-06-06 DIAGNOSIS — I10 Essential (primary) hypertension: Secondary | ICD-10-CM

## 2023-06-06 DIAGNOSIS — Z79899 Other long term (current) drug therapy: Secondary | ICD-10-CM

## 2023-06-06 NOTE — Patient Instructions (Signed)
Medication Instructions:  Your physician recommends that you continue on your current medications as directed. Please refer to the Current Medication list given to you today.  *If you need a refill on your cardiac medications before your next appointment, please call your pharmacy*  Follow-Up: At [facility], you and your health needs are our priority.  As part of our continuing mission to provide you with exceptional heart care, we have created designated Provider Care Teams.  These Care Teams include your primary Cardiologist (physician) and Advanced Practice Providers (APPs -  Physician Assistants and Nurse Practitioners) who all work together to provide you with the care you need, when you need it.  Your next appointment:   4 month(s)  Provider:   Mamie Levers, NP

## 2023-06-07 ENCOUNTER — Encounter: Payer: Self-pay | Admitting: Cardiology

## 2023-06-07 ENCOUNTER — Other Ambulatory Visit: Payer: Self-pay | Admitting: Family Medicine

## 2023-06-07 ENCOUNTER — Other Ambulatory Visit (HOSPITAL_COMMUNITY): Payer: Self-pay

## 2023-06-07 ENCOUNTER — Other Ambulatory Visit: Payer: Self-pay | Admitting: *Deleted

## 2023-06-07 DIAGNOSIS — J45909 Unspecified asthma, uncomplicated: Secondary | ICD-10-CM

## 2023-06-07 MED ORDER — APIXABAN 5 MG PO TABS
5.0000 mg | ORAL_TABLET | Freq: Two times a day (BID) | ORAL | 5 refills | Status: DC
  Filled 2023-06-07: qty 60, 30d supply, fill #0

## 2023-06-07 MED ORDER — APIXABAN 5 MG PO TABS: 5.0000 mg | ORAL_TABLET | Freq: Two times a day (BID) | ORAL | 5 refills | Status: DC

## 2023-06-07 NOTE — Addendum Note (Signed)
 Addended by: Mellody Dance B on: 06/07/2023 12:40 PM   Modules accepted: Orders

## 2023-06-07 NOTE — Telephone Encounter (Signed)
 Prescription refill request for Eliquis received.  Indication: afib  Last office visit: lambert 06/06/2023 Scr: 0.70, 05/25/2023 Age: 63 yo  Weight: 63.7 kg   Refill sent.

## 2023-06-07 NOTE — Telephone Encounter (Signed)
 Copied from CRM [id number]. Topic: Clinical - Medication Refill >> Jun 07, 2023 11:13 AM [name] wrote: Most Recent Primary Care Visit:  Provider: Sherlyn Hay  Department: [facility]  Visit Type: ACUTE  Date: 06/04/2023  Medication: ELIQUIS 5 MG TABS tablet montelukast (SINGULAIR) 10 MG tablet  Has the patient contacted their pharmacy? Yes   Is this the correct pharmacy for this prescription? Yes   [facility] [phone] Dan Humphreys, [place] - [address] [place] [address] Phone: [phone] Fax: [phone]   Has the prescription been filled recently? No. Eliquis last refilled on 10/23/22 and montelukast refilled on 02/12/23  Is the patient out of the medication? No. Patient said she only has enough Eliquis for today and for the montelukast she has a few days left.  Has the patient been seen for an appointment in the last year OR does the patient have an upcoming appointment? Yes  Can we respond through MyChart? No. Contact patient by phone at [phone]  Agent: Please be advised that Rx refills may take up to 3 business days. We ask that you follow-up with your pharmacy.

## 2023-06-08 NOTE — Telephone Encounter (Signed)
 Requested Prescriptions  Refused Prescriptions Disp Refills   apixaban (ELIQUIS) 5 MG TABS tablet 60 tablet 5    Sig: Take 1 tablet (5 mg total) by mouth 2 (two) times daily.     Hematology:  Anticoagulants - apixaban Failed - 06/08/2023  2:16 PM      Failed - Valid encounter within last 12 months    Recent Outpatient Visits           4 days ago Chronic GERD   [facility] [place], Monico Blitz, DO       Future Appointments             In 4 months Sherie Don, NP [facility] [place]   In 8 months Bacigalupo, Marzella Schlein, MD [facility], [facility]            Passed - PLT in normal range and within 360 days    Platelets  Date Value Ref Range Status  12/25/2017 426 150 - 450 x10E3/uL Final   Platelet Count  Date Value Ref Range Status  05/25/2023 314 150 - 400 K/uL Final         Passed - HGB in normal range and within 360 days    Hemoglobin  Date Value Ref Range Status  05/25/2023 12.6 12.0 - 15.0 g/dL Final  16/12/9602 54.0 11.1 - 15.9 g/dL Final         Passed - HCT in normal range and within 360 days    HCT  Date Value Ref Range Status  05/25/2023 37.5 36.0 - 46.0 % Final   Hematocrit  Date Value Ref Range Status  12/25/2017 37.1 34.0 - 46.6 % Final         Passed - Cr in normal range and within 360 days    Creatinine  Date Value Ref Range Status  11/03/2022 0.83 0.44 - 1.00 mg/dL Final   Creat  Date Value Ref Range Status  12/07/2016 0.85 0.50 - 1.05 mg/dL Final    Comment:    For patients >58 years of age, the reference limit for Creatinine is approximately 13% higher for people identified as African-American. .    Creatinine, Ser  Date Value Ref Range Status  05/25/2023 0.70 0.44 - 1.00 mg/dL Final         Passed - AST in normal range and within 360 days    AST  Date Value Ref Range Status  02/20/2023 17 0 - 40 IU/L Final   SGOT(AST)  Date Value Ref Range Status  02/26/2013 31 15  - 37 Unit/L Final         Passed - ALT in normal range and within 360 days    ALT  Date Value Ref Range Status  02/20/2023 9 0 - 32 IU/L Final   SGPT (ALT)  Date Value Ref Range Status  02/26/2013 23 12 - 78 U/L Final          montelukast (SINGULAIR) 10 MG tablet 90 tablet 1    Sig: Take 1 tablet (10 mg total) by mouth daily.     Pulmonology:  Leukotriene Inhibitors Failed - 06/08/2023  2:16 PM      Failed - Valid encounter within last 12 months    Recent Outpatient Visits           4 days ago Chronic GERD   [facility] [place], Monico Blitz, DO       Future Appointments  In 4 months Sherie Don, NP [facility] [place]   In 8 months Bacigalupo, Marzella Schlein, MD [facility], [facility]

## 2023-06-11 ENCOUNTER — Encounter: Payer: Medicare Other | Admitting: Pain Medicine

## 2023-06-11 NOTE — Progress Notes (Unsigned)
 PROVIDER NOTE: Information contained herein reflects review and annotations entered in association with encounter. Interpretation of such information and data should be left to medically-trained personnel. Information provided to patient can be located elsewhere in the medical record under "Patient Instructions". Document created using STT-dictation technology, any transcriptional errors that may result from process are unintentional.    Patient: [name]  Service Category: E/M  Provider: Oswaldo Done, MD  DOB: June 08, 1960  DOS: 06/13/2023  Referring Provider: Erasmo Downer, MD  MRN: [id number]  Specialty: Interventional Pain Management  PCP: Erasmo Downer, MD  Type: Established Patient  Setting: Ambulatory outpatient    Location: Office  Delivery: Face-to-face     HPI  [name], a 63 y.o. year old female, is here today because of her No primary diagnosis found.. [name] primary complain today is No chief complaint on file.  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); Chronic hip pain (2ry area of Pain) (Bilateral) (L>R); Chronic shoulder arthropathy (Left); Trigger point of shoulder region (Left); Enthesopathy of hip region (Left); Groin pain, chronic,  left; Other intervertebral disc degeneration, lumbar region; History of lumbar surgery; DDD (degenerative disc disease), lumbosacral; Neuropathic pain; Neurogenic pain; Spondylosis without myelopathy or radiculopathy, lumbosacral region; Chronic musculoskeletal pain; Cervical radiculitis (C4,C5,C8) (Left); DDD (degenerative disc disease), cervical; Foraminal stenosis of cervical region (C3-4) (Right); 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); Abnormal MRI, shoulder (Left: 03/20/2017) (Right: 12/11/2020); Chronic neck pain with history of cervical spinal surgery; Enthesopathy of shoulder (Left); Osteoarthritis of shoulder (Left); Symptoms referable to shoulder joint; Tendinopathy of rotator cuff (Left); Sprain of supraspinatus muscle or tendon, sequela (Left); Subdeltoid bursitis of shoulder (Left); Subacromial bursitis of shoulder (Left); Chronic shoulder pain (Left); Abnormal MRI, cervical spine (05/12/2020); Chronic upper back pain; Chronic shoulder pain (Right); Osteoarthritis of shoulder (Right); Arthropathy of shoulder (Right); C7 radiculopathy (Right); Cervical radiculopathy at C8 (Right); Muscle weakness of upper extremity (Right); Weakness of upper extremity (Right); Cervical fusion syndrome; Failed back syndrome of cervical spine; Cervicalgia; Chronic hip pain after total replacement (THR) (Right); Fall (06/13/2022); and Acute left-sided low back pain without sciatica on their pertinent problem list. Pain Assessment: Severity of   is reported as a  /10. Location:    / . Onset:  . Quality:  . Timing:  . Modifying factor(s):  Marland Kitchen Vitals:  vitals were not taken for this visit.  BMI: Estimated body mass index is 24.87 kg/m as calculated from the following:   Height as of 06/06/23: 5\' 3"  (1.6 m).   Weight as of 06/06/23: 140 lb 6.4 oz (63.7 kg). Last encounter: 03/12/2023. Last procedure: Visit date not found.  Reason for encounter: medication management. ***  Discussed the use of AI  scribe software for clinical note transcription with the patient, who gave verbal consent to proceed.  History of Present Illness         RTCB: 09/17/2023   Pharmacotherapy Assessment  Analgesic: Morphine IR 15 mg 1 tab p.o. every 6 hours (60 mg/day of morphine) MME/day: 60 mg/day.   Monitoring: [place] PMP: PDMP reviewed during this encounter.       Pharmacotherapy: No side-effects or adverse reactions reported. Compliance: No problems identified. Effectiveness: Clinically acceptable.  No notes on file  No results found for: "CBDTHCR" No results found for: "D8THCCBX" No results found for: "D9THCCBX"  UDS:  Summary  Date Value Ref Range Status  08/30/2022 Note  Final    Comment:    ==================================================================== [employer] 13 (MW) ==================================================================== Test                             Result       Flag       Units  Drug Present and Declared for Prescription Verification   Desmethyldiazepam              60           EXPECTED   ng/mg creat   Oxazepam                       222          EXPECTED   ng/mg creat   Temazepam                      117          EXPECTED   ng/mg creat    Desmethyldiazepam, oxazepam, and temazepam are expected metabolites    of diazepam. Desmethyldiazepam and oxazepam are also expected    metabolites of other drugs, including chlordiazepoxide, prazepam,    clorazepate, and halazepam. Oxazepam is an expected metabolite of    temazepam. Oxazepam and temazepam are also available as scheduled    prescription medications.    Alprazolam                     353          EXPECTED   ng/mg creat   Alpha-hydroxyalprazolam        778          EXPECTED   ng/mg creat    Source of alprazolam is a scheduled prescription medication. Alpha-    hydroxyalprazolam is an expected metabolite of alprazolam.    Morphine                       >28413       EXPECTED   ng/mg creat   Normorphine                     1386         EXPECTED   ng/mg creat    Potential sources of large amounts of morphine in the absence of    codeine include administration of morphine or use of heroin.     Normorphine is an expected metabolite of morphine.    Hydromorphone                  244          EXPECTED   ng/mg creat    Hydromorphone may be present as a metabolite of morphine;    concentrations of hydromorphone rarely exceed 5% of the morphine    concentration when this is the source of hydromorphone.  ==================================================================== Test  Result    Flag   Units      Ref Range   Creatinine              77               mg/dL      >=78 ==================================================================== Declared Medications:  The flagging and interpretation on this report are based on the  following declared medications.  Unexpected results may arise from  inaccuracies in the declared medications.   **Note: The testing scope of this panel includes these medications:   Alprazolam (Xanax)  Diazepam (Valium)  Morphine (MSIR)   **Note: The testing scope of this panel does not include the  following reported medications:   Acetaminophen (Tylenol)  Apixaban (Eliquis)  Dexlansoprazole (Dexilant)  Diltiazem (Cardizem)  Estradiol (Estrace)  Flecainide (Tambocor)  Linaclotide (Linzess)  Montelukast (Singulair)  Naloxone (Narcan)  Valacyclovir (Valtrex) ==================================================================== For clinical consultation, please call [phone]. ====================================================================       ROS  Constitutional: Denies any fever or chills Gastrointestinal: No reported hemesis, hematochezia, vomiting, or acute GI distress Musculoskeletal: Denies any acute onset joint swelling, redness, loss of ROM, or weakness Neurological: No reported episodes of acute onset apraxia,  aphasia, dysarthria, agnosia, amnesia, paralysis, loss of coordination, or loss of consciousness  Medication Review  ALPRAZolam, Mometasone Furoate, acetaminophen, apixaban, cholecalciferol, dexlansoprazole, dofetilide, linaclotide, montelukast, morphine, naloxone, and valACYclovir  History Review  Allergy: [name] is allergic to amoxicillin, chlorhexidine gluconate, clindamycin, codeine, erythromycin, penicillin g, sulfa antibiotics, shellfish allergy, decadron [dexamethasone], flecainide, papaya derivatives, betadine [povidone iodine], other, and prednisone. Drug: [name]  reports current drug use. Alcohol:  reports no history of alcohol use. Tobacco:  reports that she has never smoked. She has never used smokeless tobacco. Social: Ms. Amundson  reports that she has never smoked. She has never used smokeless tobacco. She reports current drug use. She reports that she does not drink alcohol. 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), Anemia, Anxiety, Asthma, Cephalalgia (08/18/2014), Cervical disc disease (08/18/2014), Chronic headaches, Chronic, continuous use of opioids, DDD (degenerative disc disease), Depression, Dissociative disorder, Dizziness (04/22/2013), Duodenal ulcer with hemorrhage and perforation (HCC) (04/27/2003), Foot drop, right, GERD (gastroesophageal reflux disease), H/O arthrodesis (08/18/2014), Headache(784.0), History of blood transfusion, History of cardiac cath, History of cardioversion, History of cervical spinal surgery (01/04/2015), History of kidney stones, Hypertension, Inverted T wave, Iron deficiency (02/01/2021), Mitral regurgitation, Narrowing of intervertebral disc space (08/18/2014), Orthostatic hypotension (04/22/2013), Pain, Paroxysmal atrial fibrillation (HCC) (2021), Pneumonia, PONV (postoperative nausea and vomiting), Postop Hyponatremia  (05/14/2012), Postoperative anemia due to acute blood loss (05/14/2012), PTSD (post-traumatic stress disorder), Right foot drop, Right hip arthralgia (08/18/2014), Sleep apnea (2021), Therapeutic opioid-induced constipation (OIC), and Typical atrial flutter (HCC). Surgical: [name]  has a past surgical history that includes RIGHT HIP ARTHROSCOPY FOR LABRAL TEAR (ABOUT 2010); Anterior fusion cervical spine (MAY 2012); Nasal septum surgery [facility] 2013); Back surgery (2009); Tonsillectomy; Abdominal hysterectomy; DIAGNOSTIC LAPAROSCOPIES - MULTIPLE FOR ENDOMETRIOSIS; Cholecystectomy; Hip arthroscopy (09/20/2011); Shoulder arthroscopy (05-06-12); Carpal tunnel release (05-06-12); Total hip arthroplasty (Right, 05/13/2012); Anterior cervical decomp/discectomy fusion (N/A, 10/30/2012); Posterior cervical fusion/foraminotomy (N/A, 04/16/2013); Breast reduction surgery (Bilateral, 06/2016); Colonoscopy with propofol (N/A, 01/26/2017); Esophagogastroduodenoscopy (egd) with propofol (N/A, 01/26/2017); Esophageal dilation (N/A, 01/26/2017); polypectomy (N/A, 01/26/2017); PH impedance study (N/A, 05/30/2017); Esophageal manometry (N/A, 05/30/2017); Radiofrequency ablation nerves; afib (05/2019); LEFT HEART CATH AND CORONARY ANGIOGRAPHY (Left, 06/10/2019); Cardioversion (N/A, 12/03/2019);  Esophagogastroduodenoscopy (egd) with propofol (N/A, 01/30/2020); Reduction mammaplasty (Bilateral, 05/2016); Breast biopsy (Right, 2008); Breast excisional biopsy (Left, 1998); TEE without cardioversion (N/A, 08/05/2020); ATRIAL FIBRILLATION ABLATION (N/A, 08/06/2020); Cardiac electrophysiology mapping and ablation; Esophagogastroduodenoscopy (egd) with propofol (N/A, 01/15/2021); Colonoscopy with propofol (N/A, 01/16/2021); Cardioversion (N/A, 08/01/2021); Cardioversion (N/A, 08/05/2021); Esophagogastroduodenoscopy (egd) with propofol (N/A, 12/07/2021); Lower Extremity Angiography (Left, 01/06/2022); and ATRIAL FIBRILLATION ABLATION (N/A,  12/04/2022). Family: family history includes Aneurysm in her maternal grandmother and mother; Anxiety disorder in her grandchild; Bipolar disorder in her grandchild and sister; Breast cancer (age of onset: 66) in her paternal grandmother; Depression in her grandchild.  Laboratory Chemistry Profile   Renal Lab Results  Component Value Date   BUN 8 05/25/2023   CREATININE 0.70 05/25/2023   BCR 8 (L) 02/20/2023   GFRAA >60 11/26/2019   GFRNONAA >60 05/25/2023    Hepatic Lab Results  Component Value Date   AST 17 02/20/2023   ALT 9 02/20/2023   ALBUMIN 4.0 02/20/2023   ALKPHOS 145 (H) 02/20/2023   ALKPHOS 146 (H) 02/20/2023   LIPASE 26 08/08/2021    Electrolytes Lab Results  Component Value Date   NA 135 05/25/2023   K 4.0 05/25/2023   CL 105 05/25/2023   CALCIUM 8.7 (L) 05/25/2023   MG 2.2 05/18/2023    Bone Lab Results  Component Value Date   VD25OH 8.1 (L) 02/20/2023   25OHVITD1 23 (L) 11/11/2018   25OHVITD2 9.8 11/11/2018   25OHVITD3 13 11/11/2018    Inflammation (CRP: Acute Phase) (ESR: Chronic Phase) Lab Results  Component Value Date   CRP 12 (H) 11/11/2018   ESRSEDRATE 42 (H) 11/11/2018   LATICACIDVEN 1.0 01/14/2021         Note: Above Lab results reviewed.  Recent Imaging Review  LONG TERM MONITOR (3-14 DAYS) HR 46 - 179 bpm, average 61 bpm. 117 nonsustained SVT, longest 17 beats. Occasional supraventricular ectopy, 3.8% Rare ventricular ectopy. No sustained arrhythmias. No atrial fibrillation.  Sheria Lang T. Lalla Brothers, MD, [facility], [facility] Cardiac Electrophysiology Note: Reviewed        Physical Exam  General appearance: Well nourished, well developed, and well hydrated. In no apparent acute distress Mental status: Alert, oriented x 3 (person, place, & time)       Respiratory: No evidence of acute respiratory distress Eyes: PERLA Vitals: There were no vitals taken for this visit. BMI: Estimated body mass index is 24.87 kg/m as calculated from the  following:   Height as of 06/06/23: 5\' 3"  (1.6 m).   Weight as of 06/06/23: 140 lb 6.4 oz (63.7 kg). Ideal: Ideal body weight: 52.4 kg (115 lb 8.3 oz) Adjusted ideal body weight: 56.9 kg (125 lb 7.6 oz)  Assessment   Diagnosis Status  1. Chronic low back pain (1ry area of Pain) (Bilateral) (L>R) w/o sciatica   2. Chronic hip pain (2ry area of Pain) (Bilateral) (L>R)   3. Chronic shoulder pain (3ry area of Pain) (Bilateral) (L>R)   4. Chronic neck pain with history of cervical spinal surgery   5. Cervicalgia   6. Chronic upper back pain   7. Lumbar facet syndrome (Bilateral) (L>R)   8. Failed back syndrome of cervical spine   9. 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 Chronic pain syndrome   11. Pharmacologic therapy   12. Chronic use of opiate for therapeutic purpose   13. Encounter for medication management   14. Encounter for chronic pain management    Controlled Controlled Controlled  Updated Problems: No problems updated.  Plan of Care  Problem-specific:  Assessment and Plan            [name] has a current medication list which includes the following long-term medication(s): apixaban, dexlansoprazole, dofetilide, linaclotide, mometasone furoate, montelukast, and morphine.  Pharmacotherapy (Medications Ordered): No orders of the defined types were placed in this encounter.  Orders:  No orders of the defined types were placed in this encounter.  Follow-up plan:   No follow-ups on file.      Interventional Therapies  Risk Factors  Considerations:   ELIQUIS ANTICOAGULATION: (Stop: 3 days  Restart: 6 hours) ALLERGY: CONTRAST; shellfish; STEROIDS (dexamethasone, prednisone); Iodine HARDWARE: Not a candidate for RFA or SCS (cervical or lumbar). Patient not interested in IT pump (due to surgical risks). Concomitant BENZO therapy.  (At risk of respiratory depression) A-fib/flutter; unstable angina; CAD; (+) Cardiac cath; (+)  cardioversion; mitral regurgitation Multiple allergies; OSA; GERD; IBS; OIC Depression; dissociative disorder; PTSD        Planned  Pending:   Left shoulder MRI and PT ordered today (08/30/2022)   Under consideration:   Diagnostic right suprascapular NB #1 (without steroids)  Possible left cervical ESI #1 (With steroids, if suprascapular nerve RFA does not eliminate all of the pain in the left shoulder.)   Completed (No Steroids):   Diagnostic bilateral lumbar facet MBB x2 (No Steroids)) (01/14/2019) (100/100/100/0)  Therapeutic bilateral suprascapular NB x2 (No Steroids) (06/19/2016) (80/80/0)  Therapeutic left suprascapular nerve RFA x3 (05/13/2020) (100/100/75/0)    Therapeutic  Palliative (PRN) options:   Diagnostic bilateral lumbar facet MBB #2  Palliative bilateral suprascapular NB #3 (No Steroids) Palliative left suprascapular nerve RFA #2    Pharmacotherapy  Failed trial of membrane stabilizers (Neurontin/Lyrica).      Recent Visits No visits were found meeting these conditions. Showing recent visits within past 90 days and meeting all other requirements Future Appointments Date Type Provider Dept  06/13/23 Appointment Delano Metz, MD [facility]  Showing future appointments within next 90 days and meeting all other requirements  I discussed the assessment and treatment plan with the patient. The patient was provided an opportunity to ask questions and all were answered. The patient agreed with the plan and demonstrated an understanding of the instructions.  Patient advised to call back or seek an in-person evaluation if the symptoms or condition worsens.  Duration of encounter: *** minutes.  Total time on encounter, as per AMA guidelines included both the face-to-face and non-face-to-face time personally spent by the physician and/or other qualified health care professional(s) on the day of the encounter (includes time in activities that require the  physician or other qualified health care professional and does not include time in activities normally performed by clinical staff). Physician's time may include the following activities when performed: Preparing to see the patient (e.g., pre-charting review of records, searching for previously ordered imaging, lab work, and nerve conduction tests) Review of prior analgesic pharmacotherapies. Reviewing PMP Interpreting ordered tests (e.g., lab work, imaging, nerve conduction tests) Performing post-procedure evaluations, including interpretation of diagnostic procedures Obtaining and/or reviewing separately obtained history Performing a medically appropriate examination and/or evaluation Counseling and educating the patient/family/caregiver Ordering medications, tests, or procedures Referring and communicating with other health care professionals (when not separately reported) Documenting clinical information in the electronic or other health record Independently interpreting results (not separately reported) and communicating results to the patient/ family/caregiver Care coordination (not separately reported)  Note by: Sydnee Levans  Laban Emperor, MD Date: 06/13/2023; Time: 7:16 AM

## 2023-06-12 NOTE — Patient Instructions (Incomplete)

## 2023-06-13 ENCOUNTER — Ambulatory Visit: Payer: Medicare Other | Attending: Pain Medicine | Admitting: Pain Medicine

## 2023-06-13 DIAGNOSIS — M961 Postlaminectomy syndrome, not elsewhere classified: Secondary | ICD-10-CM | POA: Diagnosis not present

## 2023-06-13 DIAGNOSIS — M549 Dorsalgia, unspecified: Secondary | ICD-10-CM | POA: Diagnosis not present

## 2023-06-13 DIAGNOSIS — M47816 Spondylosis without myelopathy or radiculopathy, lumbar region: Secondary | ICD-10-CM | POA: Insufficient documentation

## 2023-06-13 DIAGNOSIS — G8929 Other chronic pain: Secondary | ICD-10-CM | POA: Diagnosis not present

## 2023-06-13 DIAGNOSIS — Z79899 Other long term (current) drug therapy: Secondary | ICD-10-CM | POA: Diagnosis not present

## 2023-06-13 DIAGNOSIS — M25552 Pain in left hip: Secondary | ICD-10-CM | POA: Insufficient documentation

## 2023-06-13 DIAGNOSIS — Z9889 Other specified postprocedural states: Secondary | ICD-10-CM | POA: Insufficient documentation

## 2023-06-13 DIAGNOSIS — Z79891 Long term (current) use of opiate analgesic: Secondary | ICD-10-CM | POA: Insufficient documentation

## 2023-06-13 DIAGNOSIS — M25512 Pain in left shoulder: Secondary | ICD-10-CM | POA: Diagnosis not present

## 2023-06-13 DIAGNOSIS — G894 Chronic pain syndrome: Secondary | ICD-10-CM | POA: Diagnosis not present

## 2023-06-13 DIAGNOSIS — G8928 Other chronic postprocedural pain: Secondary | ICD-10-CM | POA: Insufficient documentation

## 2023-06-13 DIAGNOSIS — M25551 Pain in right hip: Secondary | ICD-10-CM | POA: Diagnosis not present

## 2023-06-13 DIAGNOSIS — M25511 Pain in right shoulder: Secondary | ICD-10-CM | POA: Insufficient documentation

## 2023-06-13 DIAGNOSIS — M542 Cervicalgia: Secondary | ICD-10-CM | POA: Diagnosis not present

## 2023-06-13 DIAGNOSIS — M545 Low back pain, unspecified: Secondary | ICD-10-CM | POA: Diagnosis not present

## 2023-06-13 MED ORDER — MORPHINE SULFATE 15 MG PO TABS: 15.0000 mg | ORAL_TABLET | Freq: Four times a day (QID) | ORAL | 0 refills | Status: DC | PRN

## 2023-06-13 NOTE — Progress Notes (Signed)
 Nursing Pain Medication Assessment:  Safety precautions to be maintained throughout the outpatient stay will include: orient to surroundings, keep bed in low position, maintain call bell within reach at all times, provide assistance with transfer out of bed and ambulation.  Medication Inspection Compliance: Pill count conducted under aseptic conditions, in front of the patient. Neither the pills nor the bottle was removed from the patient's sight at any time. Once count was completed pills were immediately returned to the patient in their original bottle.  Medication: Morphine IR Pill/Patch Count:  35 of 120 pills remain Pill/Patch Appearance: Markings consistent with prescribed medication Bottle Appearance: Standard pharmacy container. Clearly labeled. Filled Date: 3 / 31 / 2025 Last Medication intake:  Today

## 2023-06-14 ENCOUNTER — Ambulatory Visit: Admitting: Physician Assistant

## 2023-07-31 ENCOUNTER — Encounter (INDEPENDENT_AMBULATORY_CARE_PROVIDER_SITE_OTHER): Payer: Self-pay

## 2023-08-04 ENCOUNTER — Other Ambulatory Visit: Payer: Self-pay | Admitting: Family Medicine

## 2023-08-04 DIAGNOSIS — K219 Gastro-esophageal reflux disease without esophagitis: Secondary | ICD-10-CM

## 2023-09-03 ENCOUNTER — Other Ambulatory Visit: Payer: Self-pay | Admitting: Family Medicine

## 2023-09-03 DIAGNOSIS — K219 Gastro-esophageal reflux disease without esophagitis: Secondary | ICD-10-CM

## 2023-09-12 ENCOUNTER — Other Ambulatory Visit: Payer: Self-pay | Admitting: Nurse Practitioner

## 2023-09-12 ENCOUNTER — Ambulatory Visit: Attending: Nurse Practitioner | Admitting: Nurse Practitioner

## 2023-09-12 ENCOUNTER — Encounter: Payer: Self-pay | Admitting: Nurse Practitioner

## 2023-09-12 DIAGNOSIS — Z79899 Other long term (current) drug therapy: Secondary | ICD-10-CM | POA: Diagnosis not present

## 2023-09-12 DIAGNOSIS — M545 Low back pain, unspecified: Secondary | ICD-10-CM | POA: Diagnosis not present

## 2023-09-12 DIAGNOSIS — M25511 Pain in right shoulder: Secondary | ICD-10-CM | POA: Diagnosis not present

## 2023-09-12 DIAGNOSIS — G8929 Other chronic pain: Secondary | ICD-10-CM | POA: Diagnosis not present

## 2023-09-12 DIAGNOSIS — G8928 Other chronic postprocedural pain: Secondary | ICD-10-CM | POA: Insufficient documentation

## 2023-09-12 DIAGNOSIS — M25551 Pain in right hip: Secondary | ICD-10-CM | POA: Insufficient documentation

## 2023-09-12 DIAGNOSIS — M542 Cervicalgia: Secondary | ICD-10-CM | POA: Insufficient documentation

## 2023-09-12 DIAGNOSIS — M47816 Spondylosis without myelopathy or radiculopathy, lumbar region: Secondary | ICD-10-CM | POA: Insufficient documentation

## 2023-09-12 DIAGNOSIS — G894 Chronic pain syndrome: Secondary | ICD-10-CM | POA: Diagnosis not present

## 2023-09-12 DIAGNOSIS — Z79891 Long term (current) use of opiate analgesic: Secondary | ICD-10-CM | POA: Insufficient documentation

## 2023-09-12 DIAGNOSIS — M549 Dorsalgia, unspecified: Secondary | ICD-10-CM | POA: Diagnosis not present

## 2023-09-12 DIAGNOSIS — M25552 Pain in left hip: Secondary | ICD-10-CM | POA: Insufficient documentation

## 2023-09-12 DIAGNOSIS — Z9889 Other specified postprocedural states: Secondary | ICD-10-CM | POA: Diagnosis not present

## 2023-09-12 DIAGNOSIS — M961 Postlaminectomy syndrome, not elsewhere classified: Secondary | ICD-10-CM | POA: Insufficient documentation

## 2023-09-12 DIAGNOSIS — M25512 Pain in left shoulder: Secondary | ICD-10-CM | POA: Diagnosis not present

## 2023-09-12 MED ORDER — MORPHINE SULFATE 15 MG PO TABS
15.0000 mg | ORAL_TABLET | Freq: Four times a day (QID) | ORAL | 0 refills | Status: DC | PRN
Start: 2023-11-20 — End: 2023-09-24

## 2023-09-12 MED ORDER — MORPHINE SULFATE 15 MG PO TABS: 15.0000 mg | ORAL_TABLET | Freq: Four times a day (QID) | ORAL | 0 refills | Status: DC | PRN

## 2023-09-12 NOTE — Patient Instructions (Signed)
Medication Rules  Applies to: All patients receiving prescriptions (written or electronic).  Pharmacy of record: Pharmacy where electronic prescriptions will be sent. If written prescriptions are taken to a different pharmacy, please inform the nursing staff. The pharmacy listed in the electronic medical record should be the one where you would like electronic prescriptions to be sent.  Prescription refills: Only during scheduled appointments. Applies to both, written and electronic prescriptions.  NOTE: The following applies primarily to controlled substances (Opioid* Pain Medications).   Patient's responsibilities: Pain Pills: Bring all pain pills to every appointment (except for procedure appointments). Pill Bottles: Bring pills in original pharmacy bottle. Always bring newest bottle. Bring bottle, even if empty. Medication refills: You are responsible for knowing and keeping track of what medications you need refilled. The day before your appointment, write a list of all prescriptions that need to be refilled. Bring that list to your appointment and give it to the admitting nurse. Prescriptions will be written only during appointments. If you forget a medication, it will not be "Called in", "Faxed", or "electronically sent". You will need to get another appointment to get these prescribed. Prescription Accuracy: You are responsible for carefully inspecting your prescriptions before leaving our office. Have the discharge nurse carefully go over each prescription with you, before taking them home. Make sure that your name is accurately spelled, that your address is correct. Check the name and dose of your medication to make sure it is accurate. Check the number of pills, and the written instructions to make sure they are clear and accurate. Make sure that you are given enough medication to last until your next medication refill appointment. Taking Medication: Take medication as prescribed. Never  take more pills than instructed. Never take medication more frequently than prescribed. Taking less pills or less frequently is permitted and encouraged, when it comes to controlled substances (written prescriptions).  Inform other Doctors: Always inform, all of your healthcare providers, of all the medications you take. Pain Medication from other Providers: You are not allowed to accept any additional pain medication from any other Doctor or Healthcare provider. There are two exceptions to this rule. (see below) In the event that you require additional pain medication, you are responsible for notifying us, as stated below. Medication Agreement: You are responsible for carefully reading and following our Medication Agreement. This must be signed before receiving any prescriptions from our practice. Safely store a copy of your signed Agreement. Violations to the Agreement will result in no further prescriptions. (Additional copies of our Medication Agreement are available upon request.) Laws, Rules, & Regulations: All patients are expected to follow all Federal and State Laws, Statutes, Rules, & Regulations. Ignorance of the Laws does not constitute a valid excuse. The use of any illegal substances is prohibited. Adopted CDC guidelines & recommendations: Target dosing levels will be at or below 60 MME/day. Use of benzodiazepines** is not recommended.  Exceptions: There are only two exceptions to the rule of not receiving pain medications from other Healthcare Providers. Exception #1 (Emergencies): In the event of an emergency (i.e.: accident requiring emergency care), you are allowed to receive additional pain medication. However, you are responsible for: As soon as you are able, call our office [phone], at any time of the day or night, and leave a message stating your name, the date and nature of the emergency, and the name and dose of the medication prescribed. In the event that your call is answered  by a member of   our staff, make sure to document and save the date, time, and the name of the person that took your information.  Exception #2 (Planned Surgery): In the event that you are scheduled by another doctor or dentist to have any type of surgery or procedure, you are allowed (for a period no longer than 30 days), to receive additional pain medication, for the acute post-op pain. However, in this case, you are responsible for picking up a copy of our "Post-op Pain Management for Surgeons" handout, and giving it to your surgeon or dentist. This document is available at our office, and does not require an appointment to obtain it. Simply go to our office during business hours (Monday-Thursday from 8:00 AM to 4:00 PM) (Friday 8:00 AM to 12:00 Noon) or if you have a scheduled appointment with us, prior to your surgery, and ask for it by name. In addition, you will need to provide us with your name, name of your surgeon, type of surgery, and date of procedure or surgery.  *Opioid medications include: morphine, codeine, oxycodone, oxymorphone, hydrocodone, hydromorphone, meperidine, tramadol, tapentadol, buprenorphine, fentanyl, methadone. **Benzodiazepine medications include: diazepam (Valium), alprazolam (Xanax), clonazepam (Klonopine), lorazepam (Ativan), clorazepate (Tranxene), chlordiazepoxide (Librium), estazolam (Prosom), oxazepam (Serax), temazepam (Restoril), triazolam (Halcion) (Last updated: 05/10/2017)  

## 2023-09-12 NOTE — Progress Notes (Signed)
 PROVIDER NOTE: Interpretation of information contained herein should be left to medically-trained personnel. Specific patient instructions are provided elsewhere under Patient Instructions section of medical record. This document was created in part using AI and STT-dictation technology, any transcriptional errors that may result from this process are unintentional.  Patient: [name]  Service: E/M   PCP: Shelby Jon HERO, MD  DOB: Mar 13, 1961  DOS: 09/12/2023  Provider: Emmy MARLA Blanch, NP  MRN: [id number]  Delivery: Face-to-face  Specialty: Interventional Pain Management  Type: Established Patient  Setting: Ambulatory outpatient facility  Specialty designation: 09  Referring Prov.: Shelby Jon HERO, MD  Location: Outpatient office facility       History of present illness (HPI) [name], a 63 y.o. year old female, is here today because of her No primary diagnosis found.. 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  Pain Assessment: Severity of Chronic pain is reported as a 2 /10. Location: Hip Left/Radiates to left knee. Also having issues with left foot going numb serval times a week.. Onset: More than a month ago. Quality: Sharp, Numbness, Pins and needles, Aching, Constant. Timing: Constant. Modifying factor(s): Pain medication, tylenol , and rest. Vitals:  height is 5' 3 (1.6 m) and  weight is 140 lb (63.5 kg). Her temporal temperature is 98.2 F (36.8 C). Her blood pressure is 125/82 and her pulse is 65. Her respiration is 16 and oxygen saturation is 99%.  BMI: Estimated body mass index is 24.8 kg/m as calculated from the following:   Height as of this encounter: 5' 3 (1.6 m).   Weight as of this encounter: 140 lb (63.5 kg).  Last encounter: 06/13/2023 Last procedure: 06/06/2022  Reason for encounter: medication management.  The patient indicates doing well with current medication regimen.  No adverse reaction or side effects reported to medication.   The patient reports left hip pain.  She also experiences numbness and sharp needling sensation in the left foot several times per week, along with occasional sharp pain between the toes the symptoms began approximately 1 to 2 months ago.  There is no documented hemoglobin A1c or glucose level in the patient's lab history.  I advised the patient that continue monitor symptoms.   Pharmacotherapy Assessment    Morphine  (MS IR) 15 mg tablet every 6 hours as needed for pain. MME=90 Monitoring: [place] PMP: PDMP reviewed during this encounter.       Pharmacotherapy: No side-effects or adverse reactions reported. Compliance: No problems identified. Effectiveness: Clinically acceptable.  Bonner Norris, RN  09/12/2023 12:56 PM  Sign when Signing Visit Nursing Pain Medication Assessment:  Safety precautions to be maintained throughout the outpatient stay will include: orient to surroundings, keep bed in low position, maintain call bell within reach at all times, provide assistance with transfer out of bed and ambulation.   Medication Inspection Compliance: Pill count conducted under aseptic conditions, in front of the patient. Neither the pills nor the bottle was removed from the patient's sight at any time. Once count was  completed pills were immediately returned to the patient in their original bottle.  Medication: Morphine   IR Pill/Patch Count: 41 of 120 pills/patches remain Pill/Patch Appearance: Markings consistent with prescribed medication Bottle Appearance: Standard pharmacy container. Clearly labeled. Filled Date: 06 / 11 / 2025 Last Medication intake:  Today      UDS:  Summary  Date Value Ref Range Status  08/30/2022 Note  Final    Comment:    ==================================================================== Tox[employer]SU[employer] Select 13 (MW) ==================================================================== Test                             Result       Flag       Units  Drug Present and Declared for Prescription Verification   Desmethyldiazepam              60           EXPECTED   ng/mg creat   Oxazepam                       222          EXPECTED   ng/mg creat   Temazepam                      117          EXPECTED   ng/mg creat    Desmethyldiazepam, oxazepam, and temazepam are expected metabolites    of diazepam . Desmethyldiazepam and oxazepam are also expected    metabolites of other drugs, including chlordiazepoxide, prazepam,    clorazepate, and halazepam. Oxazepam is an expected metabolite of    temazepam. Oxazepam and temazepam are also available as scheduled    prescription medications.    Alprazolam                      353          EXPECTED   ng/mg creat   Alpha-hydroxyalprazolam        778          EXPECTED   ng/mg creat    Source of alprazolam  is a scheduled prescription medication. Alpha-    hydroxyalprazolam is an expected metabolite of alprazolam .    Morphine                        >87012       EXPECTED   ng/mg creat   Normorphine                    1386         EXPECTED   ng/mg creat    Potential sources of large amounts of morphine  in the absence of    codeine include administration of morphine  or use of heroin.     Normorphine is an expected metabolite of morphine .    Hydromorphone                   244          EXPECTED   ng/mg creat    Hydromorphone  may be present as a  metabolite of morphine ;    concentrations of hydromorphone  rarely exceed 5% of the morphine     concentration when this is the source of hydromorphone .  ==================================================================== Test                      Result  Flag   Units      Ref Range   Creatinine              77               mg/dL      >=79 ==================================================================== Declared Medications:  The flagging and interpretation on this report are based on the  following declared medications.  Unexpected results may arise from  inaccuracies in the declared medications.   **Note: The testing scope of this panel includes these medications:   Alprazolam  (Xanax )  Diazepam  (Valium )  Morphine  (MSIR)   **Note: The testing scope of this panel does not include the  following reported medications:   Acetaminophen  (Tylenol )  Apixaban  (Eliquis )  Dexlansoprazole  (Dexilant )  Diltiazem  (Cardizem )  Estradiol  (Estrace )  Flecainide  (Tambocor )  Linaclotide  (Linzess )  Montelukast  (Singulair )  Naloxone  (Narcan )  Valacyclovir  (Valtrex ) ==================================================================== For clinical consultation, please call [phone]. ====================================================================     No results found for: CBDTHCR No results found for: D8THCCBX No results found for: D9THCCBX  ROS  Constitutional: Denies any fever or chills Gastrointestinal: No reported hemesis, hematochezia, vomiting, or acute GI distress Musculoskeletal: Left hip pain, Intermittent left foot numbness Neurological: No reported episodes of acute onset apraxia, aphasia, dysarthria, agnosia, amnesia, paralysis, loss of coordination, or loss of consciousness  Medication Review  ALPRAZolam , Mometasone  Furoate, acetaminophen , apixaban , cholecalciferol, dexlansoprazole , dofetilide , linaclotide , montelukast , morphine , naloxone , and  valACYclovir   History Review  Allergy: [name] is allergic to amoxicillin, chlorhexidine  gluconate, clindamycin, codeine, erythromycin, penicillin g, sulfa antibiotics, shellfish allergy, decadron  [dexamethasone ], flecainide , papaya derivatives, betadine [povidone iodine], other, and prednisone . Drug: [name]  reports current drug use. Alcohol:  reports no history of alcohol use. Tobacco:  reports that she has never smoked. She has never used smokeless tobacco. Social: [name]  reports that she has never smoked. She has never used smokeless tobacco. She reports current drug use. She reports that she does not drink alcohol. 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), Anemia, Anxiety, Asthma, Cephalalgia (08/18/2014), Cervical disc disease (08/18/2014), Chronic headaches, Chronic, continuous use of opioids, DDD (degenerative disc disease), Depression, Dissociative disorder, Dizziness (04/22/2013), Duodenal ulcer with hemorrhage and perforation (HCC) (04/27/2003), Foot drop, right, GERD (gastroesophageal reflux disease), H/O arthrodesis (08/18/2014), Headache(784.0), History of blood transfusion, History of cardiac cath, History of cardioversion, History of cervical spinal surgery (01/04/2015), History of kidney stones, Hypertension, Inverted T wave, Iron  deficiency (02/01/2021), Mitral regurgitation, Narrowing of intervertebral disc space (08/18/2014), Orthostatic hypotension (04/22/2013), Pain, Paroxysmal atrial fibrillation (HCC) (2021), Pneumonia, PONV (postoperative nausea and vomiting), Postop Hyponatremia (05/14/2012), Postoperative anemia due to acute blood loss (05/14/2012), PTSD (post-traumatic stress disorder), Right foot drop, Right hip arthralgia (08/18/2014), Sleep apnea (2021), Therapeutic opioid-induced constipation (OIC), and Typical atrial flutter (HCC). Surgical:  [name]  has a past surgical history that includes RIGHT HIP ARTHROSCOPY FOR LABRAL TEAR (ABOUT 2010); Anterior fusion cervical spine (MAY 2012); Nasal septum surgery [facility] 2013); Back surgery (2009); Tonsillectomy; Abdominal hysterectomy; DIAGNOSTIC LAPAROSCOPIES - MULTIPLE FOR ENDOMETRIOSIS; Cholecystectomy; Hip arthroscopy (09/20/2011); Shoulder arthroscopy (05-06-12); Carpal tunnel release (05-06-12); Total hip arthroplasty (Right, 05/13/2012); Anterior cervical decomp/discectomy fusion (N/A, 10/30/2012); Posterior cervical fusion/foraminotomy (N/A, 04/16/2013); Breast reduction surgery (Bilateral, 06/2016); Colonoscopy with propofol  (N/A, 01/26/2017); Esophagogastroduodenoscopy (egd) with propofol  (N/A, 01/26/2017); Esophageal dilation (N/A, 01/26/2017); polypectomy (N/A, 01/26/2017); PH impedance study (N/A, 05/30/2017); Esophageal manometry (N/A, 05/30/2017); Radiofrequency ablation nerves; afib (05/2019); LEFT HEART CATH AND CORONARY ANGIOGRAPHY (  Left, 06/10/2019); Cardioversion (N/A, 12/03/2019); Esophagogastroduodenoscopy (egd) with propofol  (N/A, 01/30/2020); Reduction mammaplasty (Bilateral, 05/2016); Breast biopsy (Right, 2008); Breast excisional biopsy (Left, 1998); TEE without cardioversion (N/A, 08/05/2020); ATRIAL FIBRILLATION ABLATION (N/A, 08/06/2020); Cardiac electrophysiology mapping and ablation; Esophagogastroduodenoscopy (egd) with propofol  (N/A, 01/15/2021); Colonoscopy with propofol  (N/A, 01/16/2021); Cardioversion (N/A, 08/01/2021); Cardioversion (N/A, 08/05/2021); Esophagogastroduodenoscopy (egd) with propofol  (N/A, 12/07/2021); Lower Extremity Angiography (Left, 01/06/2022); and ATRIAL FIBRILLATION ABLATION (N/A, 12/04/2022). Family: family history includes Aneurysm in her maternal grandmother and mother; Anxiety disorder in her grandchild; Bipolar disorder in her grandchild and sister; Breast cancer (age of onset: 68) in her paternal grandmother; Depression in her grandchild.  Laboratory Chemistry  Profile   Renal Lab Results  Component Value Date   BUN 8 05/25/2023   CREATININE 0.70 05/25/2023   BCR 8 (L) 02/20/2023   GFRAA >60 11/26/2019   GFRNONAA >60 05/25/2023    Hepatic Lab Results  Component Value Date   AST 17 02/20/2023   ALT 9 02/20/2023   ALBUMIN 4.0 02/20/2023   ALKPHOS 145 (H) 02/20/2023   ALKPHOS 146 (H) 02/20/2023   LIPASE 26 08/08/2021    Electrolytes Lab Results  Component Value Date   NA 135 05/25/2023   K 4.0 05/25/2023   CL 105 05/25/2023   CALCIUM  8.7 (L) 05/25/2023   MG 2.2 05/18/2023    Bone Lab Results  Component Value Date   VD25OH 8.1 (L) 02/20/2023   25OHVITD1 23 (L) 11/11/2018   25OHVITD2 9.8 11/11/2018   25OHVITD3 13 11/11/2018    Inflammation (CRP: Acute Phase) (ESR: Chronic Phase) Lab Results  Component Value Date   CRP 12 (H) 11/11/2018   ESRSEDRATE 42 (H) 11/11/2018   LATICACIDVEN 1.0 01/14/2021         Note: Above Lab results reviewed.  Recent Imaging Review  LONG TERM MONITOR (3-14 DAYS) HR 46 - 179 bpm, average 61 bpm. 117 nonsustained SVT, longest 17 beats. Occasional supraventricular ectopy, 3.8% Rare ventricular ectopy. No sustained arrhythmias. No atrial fibrillation.  Ole T. Cindie, MD, [facility], [facility] Cardiac Electrophysiology Note: Reviewed        Physical Exam  Vitals: BP 125/82 (Patient Position: Sitting, Cuff Size: Normal)   Pulse 65   Temp 98.2 F (36.8 C) (Temporal)   Resp 16   Ht 5' 3 (1.6 m)   Wt 140 lb (63.5 kg)   SpO2 99%   BMI 24.80 kg/m  BMI: Estimated body mass index is 24.8 kg/m as calculated from the following:   Height as of this encounter: 5' 3 (1.6 m).   Weight as of this encounter: 140 lb (63.5 kg). Ideal: Ideal body weight: 52.4 kg (115 lb 8.3 oz) Adjusted ideal body weight: 56.8 kg (125 lb 5 oz) General appearance: Well nourished, well developed, and well hydrated. In no apparent acute distress Mental status: Alert, oriented x 3 (person, place, & time)        Respiratory: No evidence of acute respiratory distress Eyes: PERLA Assessment   Diagnosis Status  1. Chronic low back pain (1ry area of Pain) (Bilateral) (L>R) w/o sciatica   2. Chronic hip pain (2ry area of Pain) (Bilateral) (L>R)   3. Chronic shoulder pain (3ry area of Pain) (Bilateral) (L>R)   4. Chronic neck pain with history of cervical spinal surgery   5. Cervicalgia   6. Chronic upper back pain   7. Lumbar facet syndrome (Bilateral) (L>R)   8. Failed back syndrome of cervical spine   9. Failed back surgical syndrome (surgery by  Dr. Burnetta)   10. Chronic pain syndrome   11. Pharmacologic therapy   12. Chronic use of opiate for therapeutic purpose   13. Encounter for medication management   14. Encounter for chronic pain management    Controlled Controlled Controlled   Updated Problems: No problems updated.  Plan of Care  Problem-specific:  Assessment and Plan We will continue on current medication regimen.  Prescribing drug monitoring (PDMP) reviewed; findings consistent with the use of prescribed medication and no evidence of narcotic misuse or abuse. Routine UDS ordered today.  Schedule follow-up in 90 days for medication management.  I advised the patient that if the symptoms persist and do not improve, she should contact the front desk to schedule a follow-up evaluation with Dr. Naveira.  [name] has a current medication list which includes the following long-term medication(s): apixaban , dexlansoprazole , dofetilide , linaclotide , mometasone  furoate, montelukast , [START ON 09/21/2023] morphine , [START ON 10/21/2023] morphine , and [START ON 11/20/2023] morphine .  Pharmacotherapy (Medications Ordered): Meds ordered this encounter  Medications   morphine  (MSIR) 15 MG tablet    Sig: Take 1 tablet (15 mg total) by mouth every 6 (six) hours as needed for moderate pain (pain score 4-6) or severe pain (pain score 7-10). Must last 30 days.    Dispense:  120 tablet     Refill:  0    DO NOT: delete (not duplicate); no partial-fill (will deny script to complete), no refill request (F/U required). DISPENSE: 1 day early if closed on fill date. WARN: No CNS-depressants within 8 hrs of med.   morphine  (MSIR) 15 MG tablet    Sig: Take 1 tablet (15 mg total) by mouth every 6 (six) hours as needed for moderate pain (pain score 4-6) or severe pain (pain score 7-10). Must last 30 days.    Dispense:  120 tablet    Refill:  0    DO NOT: delete (not duplicate); no partial-fill (will deny script to complete), no refill request (F/U required). DISPENSE: 1 day early if closed on fill date. WARN: No CNS-depressants within 8 hrs of med.   morphine  (MSIR) 15 MG tablet    Sig: Take 1 tablet (15 mg total) by mouth every 6 (six) hours as needed for moderate pain (pain score 4-6) or severe pain (pain score 7-10). Must last 30 days.    Dispense:  120 tablet    Refill:  0    DO NOT: delete (not duplicate); no partial-fill (will deny script to complete), no refill request (F/U required). DISPENSE: 1 day early if closed on fill date. WARN: No CNS-depressants within 8 hrs of med.   Orders:  Orders Placed This Encounter  Procedures   ToxASSURE Select 13 (MW), Urine    Volume: 30 ml(s). Minimum 3 ml of urine is needed. Document temperature of fresh sample. Indications: Long term (current) use of opiate analgesic (S20.108)    Release to patient:   Immediate      Return in about 3 months (around 12/13/2023) for (F2F), (MM), Emmy Blanch NP.    Recent Visits No visits were found meeting these conditions. Showing recent visits within past 90 days and meeting all other requirements Today's Visits Date Type Provider Dept  09/12/23 Office Visit Termaine Roupp K, NP [facility]  Showing today's visits and meeting all other requirements Future Appointments No visits were found meeting these conditions. Showing future appointments within next 90 days and meeting all other  requirements  I discussed the assessment and  treatment plan with the patient. The patient was provided an opportunity to ask questions and all were answered. The patient agreed with the plan and demonstrated an understanding of the instructions.  Patient advised to call back or seek an in-person evaluation if the symptoms or condition worsens.  Duration of encounter: 30 minutes.  Total time on encounter, as per AMA guidelines included both the face-to-face and non-face-to-face time personally spent by the physician and/or other qualified health care professional(s) on the day of the encounter (includes time in activities that require the physician or other qualified health care professional and does not include time in activities normally performed by clinical staff). Physician's time may include the following activities when performed: Preparing to see the patient (e.g., pre-charting review of records, searching for previously ordered imaging, lab work, and nerve conduction tests) Review of prior analgesic pharmacotherapies. Reviewing PMP Interpreting ordered tests (e.g., lab work, imaging, nerve conduction tests) Performing post-procedure evaluations, including interpretation of diagnostic procedures Obtaining and/or reviewing separately obtained history Performing a medically appropriate examination and/or evaluation Counseling and educating the patient/family/caregiver Ordering medications, tests, or procedures Referring and communicating with other health care professionals (when not separately reported) Documenting clinical information in the electronic or other health record Independently interpreting results (not separately reported) and communicating results to the patient/ family/caregiver Care coordination (not separately reported)  Note by: Mychaela Lennartz K Mallory Enriques, NP (TTS and AI technology used. I apologize for any typographical errors that were not detected and corrected.) Date: 09/12/2023;  Time: 1:15 PM

## 2023-09-12 NOTE — Progress Notes (Signed)
 Nursing Pain Medication Assessment:  Safety precautions to be maintained throughout the outpatient stay will include: orient to surroundings, keep bed in low position, maintain call bell within reach at all times, provide assistance with transfer out of bed and ambulation.   Medication Inspection Compliance: Pill count conducted under aseptic conditions, in front of the patient. Neither the pills nor the bottle was removed from the patient's sight at any time. Once count was completed pills were immediately returned to the patient in their original bottle.  Medication: Morphine  IR Pill/Patch Count: 41 of 120 pills/patches remain Pill/Patch Appearance: Markings consistent with prescribed medication Bottle Appearance: Standard pharmacy container. Clearly labeled. Filled Date: 06 / 11 / 2025 Last Medication intake:  Today

## 2023-09-16 LAB — TOXASSURE SELECT 13 (MW), URINE

## 2023-09-20 ENCOUNTER — Telehealth: Payer: Self-pay

## 2023-09-20 ENCOUNTER — Other Ambulatory Visit: Payer: Self-pay | Admitting: Nurse Practitioner

## 2023-09-20 NOTE — Telephone Encounter (Signed)
 Her pharmacy doesn't have morphine . She said you told her if they didn't have it you would call out oxycodone . Can you do this? It is due to be filled tomorrow.

## 2023-09-21 ENCOUNTER — Telehealth: Payer: Self-pay | Admitting: Nurse Practitioner

## 2023-09-21 NOTE — Telephone Encounter (Signed)
 PT called stated that her pharmacy still don't have morphine  was told to call back to office to let [facility] know. Please states that she don't want to go into withdrawls over the weekend. Please give patient a call. TY

## 2023-09-21 NOTE — Telephone Encounter (Signed)
 Called patient, she states that no pharm is able to get Morphine . She is to have filled today and is afraid she would go through withdrawals. Patient states at her last appointment that GORMAN Blanch told her if she had any problems to call her office and she would send in another medication.  Told patient that providers are not in the office for medication refills on Fridays,V Neese,RN instructed me to call NP. I called SPatel and she stated that she tolld the front desk to put on her the schedule for a VV yesterday 09/20/23 and Blanch never got a message to call the patient. Blanch states that she can cut the remaining 2 pills in half and can can do a VV on Monday, 09/24/23. Blanch also stated that she discussed this patient with Dr Naveira and he did not want to changed the medication and to bring patient in for appointment. Called patient and informed her of what Blanch instructed her to do. Also can go to the ED if needed.

## 2023-09-24 ENCOUNTER — Ambulatory Visit (INDEPENDENT_AMBULATORY_CARE_PROVIDER_SITE_OTHER): Payer: Medicare Other

## 2023-09-24 ENCOUNTER — Ambulatory Visit: Attending: Nurse Practitioner | Admitting: Nurse Practitioner

## 2023-09-24 ENCOUNTER — Telehealth: Payer: Self-pay | Admitting: *Deleted

## 2023-09-24 VITALS — Ht 63.0 in | Wt 140.0 lb

## 2023-09-24 DIAGNOSIS — M47816 Spondylosis without myelopathy or radiculopathy, lumbar region: Secondary | ICD-10-CM

## 2023-09-24 DIAGNOSIS — G894 Chronic pain syndrome: Secondary | ICD-10-CM

## 2023-09-24 DIAGNOSIS — M545 Low back pain, unspecified: Secondary | ICD-10-CM

## 2023-09-24 DIAGNOSIS — M25511 Pain in right shoulder: Secondary | ICD-10-CM | POA: Diagnosis not present

## 2023-09-24 DIAGNOSIS — M542 Cervicalgia: Secondary | ICD-10-CM

## 2023-09-24 DIAGNOSIS — M25512 Pain in left shoulder: Secondary | ICD-10-CM

## 2023-09-24 DIAGNOSIS — Z79899 Other long term (current) drug therapy: Secondary | ICD-10-CM | POA: Diagnosis not present

## 2023-09-24 DIAGNOSIS — M25552 Pain in left hip: Secondary | ICD-10-CM | POA: Diagnosis not present

## 2023-09-24 DIAGNOSIS — Z0000 Encounter for general adult medical examination without abnormal findings: Secondary | ICD-10-CM | POA: Diagnosis not present

## 2023-09-24 DIAGNOSIS — M25551 Pain in right hip: Secondary | ICD-10-CM | POA: Diagnosis not present

## 2023-09-24 DIAGNOSIS — Z79891 Long term (current) use of opiate analgesic: Secondary | ICD-10-CM

## 2023-09-24 DIAGNOSIS — G8929 Other chronic pain: Secondary | ICD-10-CM

## 2023-09-24 DIAGNOSIS — G8928 Other chronic postprocedural pain: Secondary | ICD-10-CM

## 2023-09-24 DIAGNOSIS — M961 Postlaminectomy syndrome, not elsewhere classified: Secondary | ICD-10-CM

## 2023-09-24 MED ORDER — OXYCODONE HCL 10 MG PO TABS: 10.0000 mg | ORAL_TABLET | Freq: Four times a day (QID) | ORAL | 0 refills | Status: DC | PRN

## 2023-09-24 NOTE — Addendum Note (Signed)
 Addended by: TOBIE RICHMOND on: 09/24/2023 11:51 AM   Modules accepted: Level of Service

## 2023-09-24 NOTE — Progress Notes (Signed)
 PROVIDER NOTE: Interpretation of information contained herein should be left to medically-trained personnel. Specific patient instructions are provided elsewhere under Patient Instructions section of medical record. This document was created in part using AI and STT-dictation technology, any transcriptional errors that may result from this process are unintentional.  Patient: [name]  Service: E/M   PCP: Myrla Jon HERO, MD  DOB: 05-Aug-1960  DOS: 09/24/2023  Provider: Emmy MARLA Blanch, NP  MRN: [id number]  Delivery: Virtual Visit  Specialty: Interventional Pain Management  Type: Established Patient  Setting: Ambulatory outpatient facility  Specialty designation: 09  Referring Prov.: Bacigalupo, Jon HERO, MD  Location: Remote location       Virtual Encounter - Pain Management PROVIDER NOTE: Information contained herein reflects review and annotations entered in association with encounter. Interpretation of such information and data should be left to medically-trained personnel. Information provided to patient can be located elsewhere in the medical record under Patient Instructions. Document created using STT-dictation technology, any transcriptional errors that may result from process are unintentional.    Contact & Pharmacy Preferred: [phone] Home: [phone] (home) Mobile: [phone] (mobile) E-mail: [email or web address]  [facility] [phone] [place] [place] - [address] [place] [address] Phone: [phone] Fax: [phone]  [facility] [address] [place] [address] Phone: [phone] Fax: [phone]   Pre-screening  [name] offered in-person vs virtual encounter. She indicated preferring virtual for this encounter.   Reason COVID-19*  Social distancing based on CDC and AMA recommendations.   I contacted [name] on 09/24/2023 via telephone.      I clearly identified myself as Emmy MARLA Blanch, NP. I verified that I was speaking with the correct person using two identifiers (Name: [name], and date of birth: January 20, 1961).  Consent I sought verbal advanced consent from [name] for virtual visit interactions. I informed [name] of possible security and privacy concerns, risks, and limitations associated with providing not-in-person medical evaluation and management services. I also informed [name] of the availability of in-person appointments. Finally, I informed her that there would be a charge for the virtual visit and that she could be  personally, fully or partially, financially responsible for it. [name] expressed understanding and agreed to proceed.   Historic Elements   [name] is a 63 y.o. year old, female patient evaluated today after our last contact on 09/21/2023. 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), Anemia, Anxiety, Asthma, Cephalalgia (08/18/2014), Cervical disc disease (08/18/2014), Chronic headaches, Chronic, continuous use of opioids, DDD (degenerative disc disease), Depression, Dissociative disorder, Dizziness (04/22/2013), Duodenal ulcer with hemorrhage and perforation (HCC) (04/27/2003), Foot drop, right, GERD (gastroesophageal reflux disease), H/O arthrodesis (08/18/2014), Headache(784.0), History of blood transfusion, History of cardiac cath, History of cardioversion, History of cervical spinal surgery (01/04/2015), History of kidney stones, Hypertension, Inverted T wave, Iron  deficiency (02/01/2021), Mitral regurgitation, Narrowing of intervertebral disc space (08/18/2014), Orthostatic hypotension (04/22/2013), Pain, Paroxysmal atrial fibrillation (HCC) (2021), Pneumonia, PONV (postoperative nausea and vomiting), Postop Hyponatremia (05/14/2012), Postoperative anemia due to acute blood loss (05/14/2012),  PTSD (post-traumatic stress disorder), Right foot drop, Right hip arthralgia (08/18/2014), Sleep apnea (2021), Therapeutic opioid-induced constipation (OIC), and Typical atrial flutter (HCC). She also  has a past surgical history that includes RIGHT HIP ARTHROSCOPY FOR LABRAL TEAR (ABOUT 2010); Anterior fusion cervical spine (MAY 2012);  Nasal septum surgery [facility] 2013); Back surgery (2009); Tonsillectomy; Abdominal hysterectomy; DIAGNOSTIC LAPAROSCOPIES - MULTIPLE FOR ENDOMETRIOSIS; Cholecystectomy; Hip arthroscopy (09/20/2011); Shoulder arthroscopy (05-06-12); Carpal tunnel release (05-06-12); Total hip arthroplasty (Right, 05/13/2012); Anterior cervical decomp/discectomy fusion (N/A, 10/30/2012); Posterior cervical fusion/foraminotomy (N/A, 04/16/2013); Breast reduction surgery (Bilateral, 06/2016); Colonoscopy with propofol  (N/A, 01/26/2017); Esophagogastroduodenoscopy (egd) with propofol  (N/A, 01/26/2017); Esophageal dilation (N/A, 01/26/2017); polypectomy (N/A, 01/26/2017); PH impedance study (N/A, 05/30/2017); Esophageal manometry (N/A, 05/30/2017); Radiofrequency ablation nerves; afib (05/2019); LEFT HEART CATH AND CORONARY ANGIOGRAPHY (Left, 06/10/2019); Cardioversion (N/A, 12/03/2019); Esophagogastroduodenoscopy (egd) with propofol  (N/A, 01/30/2020); Reduction mammaplasty (Bilateral, 05/2016); Breast biopsy (Right, 2008); Breast excisional biopsy (Left, 1998); TEE without cardioversion (N/A, 08/05/2020); ATRIAL FIBRILLATION ABLATION (N/A, 08/06/2020); Cardiac electrophysiology mapping and ablation; Esophagogastroduodenoscopy (egd) with propofol  (N/A, 01/15/2021); Colonoscopy with propofol  (N/A, 01/16/2021); Cardioversion (N/A, 08/01/2021); Cardioversion (N/A, 08/05/2021); Esophagogastroduodenoscopy (egd) with propofol  (N/A, 12/07/2021); Lower Extremity Angiography (Left, 01/06/2022); and ATRIAL FIBRILLATION ABLATION (N/A, 12/04/2022). [name] has a current medication list which includes the following prescription(s):  naloxone , oxycodone  hcl, acetaminophen , alprazolam , apixaban , cholecalciferol, dexlansoprazole , dofetilide , linaclotide , mometasone  furoate, montelukast , morphine , [START ON 10/21/2023] morphine , [START ON 11/20/2023] morphine , and valacyclovir . She  reports that she has never smoked. She has never used smokeless tobacco. She reports current drug use. She reports that she does not drink alcohol. [name] is allergic to amoxicillin, chlorhexidine  gluconate, clindamycin, codeine, erythromycin, penicillin g, sulfa antibiotics, shellfish allergy, decadron  [dexamethasone ], flecainide , papaya derivatives, betadine [povidone iodine], other, and prednisone .  BMI: Estimated body mass index is 24.8 kg/m as calculated from the following:   Height as of 09/12/23: 5' 3 (1.6 m).   Weight as of 09/12/23: 140 lb (63.5 kg). Last encounter: 09/12/2023. Last procedure: Visit date not found.  HPI  Today, she is being contacted for medication management.  The patient indicates doing well with current medication regimen.  No side effects or adverse reaction reported to medication; however the patient is currently unable to obtain morphine  due to her pharmacy backorder or national shortage.  We discussed alternative medication option with an equivalent morphine  milligram equivalent (MME) to morphine  sulfate 15 Mg.  After reviewing the options, we agreed to start oxycodone  HCl 10 mg every 6 hours as needed for pain control.  This will be a temporary regimen for up to 30 days.    Pharmacotherapy Assessment  Oxycodone  HCl 10 mg tablet every 6 hours as needed for pain.  Monitoring: [place] PMP: PDMP reviewed during this encounter.       Pharmacotherapy: No side-effects or adverse reactions reported. Compliance: No problems identified. Effectiveness: Clinically acceptable. Plan: Refer to POC.  UDS:  Summary  Date Value Ref Range Status  09/12/2023 FINAL  Final    Comment:     ==================================================================== [employer] Select 13 (MW) ==================================================================== Test                             Result       Flag       Units  Drug Present and Declared for Prescription Verification   Alprazolam                      223          EXPECTED   ng/mg creat   Alpha-hydroxyalprazolam        570          EXPECTED   ng/mg creat    Source of alprazolam  is a scheduled prescription medication.  Alpha-    hydroxyalprazolam is an expected metabolite of alprazolam .    Morphine                        >22727       EXPECTED   ng/mg creat   Normorphine                    1882         EXPECTED   ng/mg creat    Potential sources of large amounts of morphine  in the absence of    codeine include administration of morphine  or use of heroin.     Normorphine is an expected metabolite of morphine .    Hydromorphone                   480          EXPECTED   ng/mg creat    Hydromorphone  may be present as a metabolite of morphine ;    concentrations of hydromorphone  rarely exceed 5% of the morphine     concentration when this is the source of hydromorphone .  ==================================================================== Test                      Result    Flag   Units      Ref Range   Creatinine              44               mg/dL      >=79 ==================================================================== Declared Medications:  The flagging and interpretation on this report are based on the  following declared medications.  Unexpected results may arise from  inaccuracies in the declared medications.   **Note: The testing scope of this panel includes these medications:   Alprazolam  (Xanax )  Morphine  (MSIR)   **Note: The testing scope of this panel does not include the  following reported medications:   Acetaminophen  (Tylenol )  Apixaban  (Eliquis )  Cholecalciferol  Dexlansoprazole  (Dexilant )  Dofetilide   (Tikosyn )  Linaclotide  (Linzess )  Mometasone   Montelukast  (Singulair )  Naloxone  (Narcan )  Valacyclovir  (Valtrex ) ==================================================================== For clinical consultation, please call [phone]. ====================================================================    No results found for: CBDTHCR, D8THCCBX, D9THCCBX  Laboratory Chemistry Profile   Renal Lab Results  Component Value Date   BUN 8 05/25/2023   CREATININE 0.70 05/25/2023   BCR 8 (L) 02/20/2023   GFRAA >60 11/26/2019   GFRNONAA >60 05/25/2023    Hepatic Lab Results  Component Value Date   AST 17 02/20/2023   ALT 9 02/20/2023   ALBUMIN 4.0 02/20/2023   ALKPHOS 145 (H) 02/20/2023   ALKPHOS 146 (H) 02/20/2023   LIPASE 26 08/08/2021    Electrolytes Lab Results  Component Value Date   NA 135 05/25/2023   K 4.0 05/25/2023   CL 105 05/25/2023   CALCIUM  8.7 (L) 05/25/2023   MG 2.2 05/18/2023    Bone Lab Results  Component Value Date   VD25OH 8.1 (L) 02/20/2023   25OHVITD1 23 (L) 11/11/2018   25OHVITD2 9.8 11/11/2018   25OHVITD3 13 11/11/2018    Inflammation (CRP: Acute Phase) (ESR: Chronic Phase) Lab Results  Component Value Date   CRP 12 (H) 11/11/2018   ESRSEDRATE 42 (H) 11/11/2018   LATICACIDVEN 1.0 01/14/2021         Note: Above Lab results reviewed.  Imaging  LONG TERM MONITOR (3-14 DAYS) HR 46 - 179 bpm, average 61 bpm. 117 nonsustained SVT,  longest 17 beats. Occasional supraventricular ectopy, 3.8% Rare ventricular ectopy. No sustained arrhythmias. No atrial fibrillation.  Ole T. 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  Plan of Care  Problem-specific:   Plan: Started on oxycodone  HCl 10 mg tablet every 6 hours as needed for pain for up to 30 days.  Prescribing drug monitoring (PDMP reviewed; findings consistent with the use of morphine  and no evidence of other narcotic misuse or abuse.  Schedule follow-up in 1 month for medication evaluation.   [name] has a current medication list which includes the following long-term medication(s): apixaban , dexlansoprazole , dofetilide , linaclotide , mometasone  furoate, montelukast , morphine , [START ON 10/21/2023] morphine , and [START ON 11/20/2023] morphine .  Pharmacotherapy (Medications Ordered): Meds ordered this encounter  Medications   Oxycodone  HCl 10 MG TABS    Sig: Take 1 tablet (10 mg total) by mouth every 6 (six) hours as needed. Must last 30 days.    Dispense:  120 tablet    Refill:  0    Chronic Pain: STOP Act (Not applicable) Fill 1 day early if closed on refill date. Avoid benzodiazepines within 8 hours of opioids   Orders:    Follow-up plan:   Return in about 1 month (around 10/25/2023) for (F2F), (MM), Emmy Blanch NP.      Return in about 3 months (around 12/13/2023) for (F2F), (MM), Emmy Blanch NP.    Recent Visits No visits were found meeting these conditions. Showing recent visits within past 90 days and meeting all other requirements Today's Visits Date Type Provider Dept  09/12/23 Office Visit Orian Amberg K, NP [facility]  Showing today's visits and meeting all other requirements Future Appointments No visits were found meeting these conditions. Showing future appointments within next 90  days and meeting all other requirements  I discussed the assessment and treatment plan with the patient. The patient was provided an opportunity to ask questions and all were answered. The patient agreed with the plan and demonstrated an understanding of the instructions.  Patient advised to call back or seek an in-person evaluation if the symptoms or condition worsens.  Duration of encounter: 25 minutes.  Total time on encounter, as per AMA guidelines included both the face-to-face and non-face-to-face time personally spent by the physician and/or other qualified health care professional(s) on the day of the encounter (includes time in activities that require the physician or other qualified health care professional and does not include time in activities normally performed by clinical staff). Physician's time may include the following activities when performed: Preparing to see the patient (e.g., pre-charting review of records, searching for previously ordered imaging, lab work, and nerve conduction tests) Review of prior analgesic pharmacotherapies. Reviewing PMP Interpreting ordered tests (e.g., lab work, imaging, nerve conduction tests) Performing post-procedure evaluations, including interpretation of diagnostic procedures Obtaining and/or reviewing separately obtained history Performing a medically appropriate examination and/or evaluation Counseling and educating the patient/family/caregiver Ordering medications, tests, or  procedures Referring and communicating with other health care professionals (when not separately reported) Documenting clinical information in the electronic or other health record Independently interpreting results (not separately reported) and communicating results to the patient/ family/caregiver Care coordination (not separately reported)  Note by: Donaldson Richter K [place] Duquette, NP (TTS and AI technology used. I apologize for any typographical errors that were not detected and  corrected.) Date: 09/12/2023; Time: 1:15 PM     Recent Visits Date Type Provider Dept  09/12/23 Office Visit Katrine Radich K, NP [facility]  Showing recent visits within past 90 days and meeting all other requirements Today's Visits Date Type Provider Dept  09/24/23 Office Visit Liliauna Santoni K, NP [facility]  Showing today's visits and meeting all other requirements Future Appointments Date Type Provider Dept  10/18/23 Appointment Siddhanth Denk K, NP [facility]  12/10/23 Appointment Addalee Kavanagh K, NP [facility]  Showing future appointments within next 90 days and meeting all other requirements  I discussed the assessment and treatment plan with the patient. The patient was provided an opportunity to ask questions and all were answered. The patient agreed with the plan and demonstrated an understanding of the instructions.  Patient advised to call back or seek an in-person evaluation if the symptoms or condition worsens.  Duration of encounter: 30 minutes.  Note by: Emmy MARLA Blanch, NP Date: 09/24/2023; Time: 11:34 AM

## 2023-09-24 NOTE — Patient Instructions (Signed)
 [name] , Thank you for taking time out of your busy schedule to complete your Annual Wellness Visit with me. I enjoyed our conversation and look forward to speaking with you again next year. I, as well as your care team,  appreciate your ongoing commitment to your health goals. Please review the following plan we discussed and let me know if I can assist you in the future.  Your Game plan/ To Do List   Referrals: If you haven't heard from the office you've been referred to, please reach out to them at the phone number provided.  N/a Follow up Visits: Next Medicare AWV with our clinical staff:September 24, 2024 at 1:10pm telephone Next Office Visit with your provider:February 21, 2024 at 1:20 pm in person for physical  Clinician Recommendations:    Aim for 30 minutes of exercise or brisk walking, 6-8 glasses of water , and 5 servings of fruits and vegetables each day.   I enjoyed our conversation today and look forward to talking with you again next year!! Have a wonderful and safe year. All the best, Noa Galvao      This is a list of the screening recommended for you and due dates:  Health Maintenance  Topic Date Due   Zoster (Shingles) Vaccine (1 of 2) Never done   COVID-19 Vaccine (4 - 2024-25 season) 11/12/2022   Flu Shot  10/12/2023   Mammogram  04/10/2024   Medicare Annual Wellness Visit  09/23/2024   Colon Cancer Screening  01/16/2026   DTaP/Tdap/Td vaccine (2 - Td or Tdap) 02/28/2032   Pneumococcal Vaccination  Completed   Hepatitis C Screening  Completed   HIV Screening  Completed   Hepatitis B Vaccine  Aged Out   HPV Vaccine  Aged Out   Meningitis B Vaccine  Aged Out    Advanced directives: (Declined) Advance directive discussed with you today. Even though you declined this today, please call our office should you change your mind, and we can give you the proper paperwork for you to fill out. Advance Care Planning is important because it:  [x]  Makes sure you receive the  medical care that is consistent with your values, goals, and preferences  [x]  It provides guidance to your family and loved ones and reduces their decisional burden about whether or not they are making the right decisions based on your wishes.  Follow the link provided in your after visit summary or read over the paperwork we have mailed to you to help you started getting your Advance Directives in place. If you need assistance in completing these, please reach out to us  so that we can help you!

## 2023-09-24 NOTE — Progress Notes (Signed)
 Subjective:   [name] is a 63 y.o. who presents for a Medicare Wellness preventive visit.  As a reminder, Annual Wellness Visits don't include a physical exam, and some assessments may be limited, especially if this visit is performed virtually. We may recommend an in-person follow-up visit with your provider if needed.  Visit Complete: Virtual I connected with  [name] on 09/24/23 by a audio enabled telemedicine application and verified that I am speaking with the correct person using two identifiers.  Patient Location: Home  Provider Location: Home Office  I discussed the limitations of evaluation and management by telemedicine. The patient expressed understanding and agreed to proceed.  Vital Signs: Because this visit was a virtual/telehealth visit, some criteria may be missing or patient reported. Any vitals not documented were not able to be obtained and vitals that have been documented are patient reported.  VideoDeclined- This patient declined Librarian, academic. Therefore the visit was completed with audio only.  Persons Participating in Visit: Patient.  AWV Questionnaire: Yes: Patient Medicare AWV questionnaire was completed by the patient on 09/24/2023; I have confirmed that all information answered by patient is correct and no changes since this date.  Cardiac Risk Factors include: advanced age (>78men, >4 women);sedentary lifestyle;Other (see comment), Risk factor comments: sleep apnea     Objective:    Today's Vitals   09/24/23 0921 09/24/23 1544  Weight:  140 lb (63.5 kg)  Height:  5' 3 (1.6 m)  PainSc: 6     Body mass index is 24.8 kg/m.     09/24/2023    3:43 PM 09/12/2023   12:53 PM 06/13/2023   12:52 PM 05/25/2023    1:31 PM 05/08/2023   11:36 AM 03/12/2023   11:14 AM 12/04/2022    6:18 AM  Advanced Directives  Does Patient Have a Medical Advance Directive? No No No No No No No  Would patient like information  on creating a medical advance directive? No - Patient declined No - Patient declined  No - Patient declined No - Patient declined No - Patient declined No - Patient declined    Current Medications (verified) Outpatient Encounter Medications as of 09/24/2023  Medication Sig   acetaminophen  (TYLENOL ) 325 MG tablet Take 2 tablets (650 mg total) by mouth every 6 (six) hours as needed for mild pain (or Fever >/= 101). (Patient taking differently: Take 1,000 mg by mouth as needed for mild pain (pain score 1-3) (or Fever >/= 101).)   ALPRAZolam  (XANAX ) 1 MG tablet Take 1 mg by mouth at bedtime.    apixaban  (ELIQUIS ) 5 MG TABS tablet Take 1 tablet (5 mg total) by mouth 2 (two) times daily.   cholecalciferol (VITAMIN D3) 25 MCG (1000 UNIT) tablet Take 1,000 Units by mouth daily.   dexlansoprazole  (DEXILANT ) 60 MG capsule TAKE 1 CAPSULE BY MOUTH EVERY DAY   dofetilide  (TIKOSYN ) 500 MCG capsule Take 1 capsule (500 mcg total) by mouth 2 (two) times daily.   linaclotide  (LINZESS ) 290 MCG CAPS capsule Take 1 capsule (290 mcg total) by mouth daily before breakfast.   Mometasone  Furoate (ALLERGY NASAL SPRAY NA) Place 1 spray into the nose daily as needed (allergies/ decongestion).   montelukast  (SINGULAIR ) 10 MG tablet TAKE 1 TABLET BY MOUTH EVERY DAY   naloxone  (NARCAN ) nasal spray 4 mg/0.1 mL Place 1 spray into the nose as needed for up to 365 doses (for opioid-induced respiratory depresssion). In case of emergency (overdose), spray once into  each nostril. If no response within 3 minutes, repeat application and call 911.   Oxycodone  HCl 10 MG TABS Take 1 tablet (10 mg total) by mouth every 6 (six) hours as needed. Must last 30 days.   valACYclovir  (VALTREX ) 1000 MG tablet TAKE TWO TABLETS BY MOUTH TWICE DAILY FOR ONE DAY FEVER BLISTERTAKE TWO TABLETS BY MOUTH TWICE DAILY FOR ONE DAY FEVER BLISTER   No facility-administered encounter medications on file as of 09/24/2023.    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    Chronic headaches    Chronic, continuous use of opioids    DDD (degenerative disc disease)    CERVICAL AND LUMBAR-CHRONIC PAIN, RT HIP LABRAL TEAR   Depression    PT STATES A LOT OF STRESS IN HER LIFE   Dissociative disorder    Dizziness 04/22/2013   Duodenal ulcer with hemorrhage and perforation (HCC) 04/27/2003   Foot drop, right    FROM BACK SURGERY   GERD (gastroesophageal reflux disease)    H/O arthrodesis 08/18/2014   Headache(784.0)    AND NECK PAIN--STATES RECENT TEST SHOW CERVICAL DEGENERATION   History of blood transfusion    s/p back surgery   History of cardiac cath    a. 05/2019 Cath: Nl cors. EF 55-65%.   History of cardioversion    History of cervical spinal surgery 01/04/2015   History of kidney stones    Hypertension    Inverted T wave    Iron  deficiency 02/01/2021   Mitral regurgitation    a. 07/2020 Echo: EF 60-65%, no rwma. Nl RV size/fxn. RVSP 39.20mmHg. Mildly to mod dil LA. Mod MR; b. 07/2020 TEE: EF 55-60%. Lambl's excresence. Nl RV size/fxn. Midly dil LA. No LA/LAA thrombus. Mild MR.   Narrowing of intervertebral disc space 08/18/2014   Currently on disability.    Orthostatic hypotension 04/22/2013   Pain    CHRONIC NECK AND BACK PAIN - LIMITED ROM NECK - S/P FUSIONS CERVICAL AND LUMBAR   Paroxysmal atrial  fibrillation (HCC) 2021   a. 07/2020 s/p PVI.   Pneumonia    PONV (postoperative nausea and vomiting)    PT GIVES HX OF N&V AND FEVER WITH SURGERIES YEARS AGO--BUT NO PROBLEMS WITH MORE RECENT SURGERIES--STATES NOT MALIGNANT HYPERTHERMIA   Postop Hyponatremia 05/14/2012   Postoperative anemia due to acute blood loss 05/14/2012   PTSD (post-traumatic stress disorder)    Right foot drop    Right hip arthralgia 08/18/2014   Status post surgery of right, and now needs left.    Sleep apnea 2021   does not have a cpap   Therapeutic opioid-induced constipation (OIC)    Typical atrial flutter [facility])    Past Surgical History:  Procedure Laterality Date   ABDOMINAL HYSTERECTOMY     afib  05/2019   ANTERIOR CERVICAL DECOMP/DISCECTOMY FUSION N/A 10/30/2012   Procedure: ACDF C5-6, EXPLORATION AND HARDWARE  REMOVAL C6-7;  Surgeon: Donaciano Sprang, MD;  Location: [facility] OR;  Service: Orthopedics;  Laterality: N/A;   ANTERIOR FUSION CERVICAL SPINE  MAY 2012   AT [facility]   ATRIAL FIBRILLATION ABLATION N/A 08/06/2020   Procedure: ATRIAL FIBRILLATION ABLATION;  Surgeon: Cindie Ole DASEN, MD;  Location: [facility] INVASIVE CV LAB;  Service: Cardiovascular;  Laterality: N/A;   ATRIAL FIBRILLATION ABLATION N/A 12/04/2022   Procedure: ATRIAL FIBRILLATION ABLATION;  Surgeon: Cindie Ole DASEN, MD;  Location: [facility] INVASIVE CV LAB;  Service: Cardiovascular;  Laterality: N/A;   BACK SURGERY  2009   LUMBAR FUSION WITH RODS    BREAST BIOPSY Right 2008   benign.- bx/clip   BREAST EXCISIONAL BIOPSY Left 1998   benign   BREAST REDUCTION SURGERY Bilateral 06/2016   CARDIAC ELECTROPHYSIOLOGY MAPPING AND ABLATION     CARDIOVERSION N/A 12/03/2019   Procedure: CARDIOVERSION;  Surgeon: Darliss Rogue, MD;  Location: [facility] ORS;  Service: Cardiovascular;  Laterality: N/A;   CARDIOVERSION N/A 08/01/2021   Procedure: CARDIOVERSION;  Surgeon: Darliss Rogue, MD;  Location: [facility] ORS;  Service: Cardiovascular;  Laterality: N/A;    CARDIOVERSION N/A 08/05/2021   Procedure: CARDIOVERSION;  Surgeon: Perla Evalene PARAS, MD;  Location: [facility] ORS;  Service: Cardiovascular;  Laterality: N/A;   CARPAL TUNNEL RELEASE  05-06-12   Right   CHOLECYSTECTOMY     COLONOSCOPY WITH PROPOFOL  N/A 01/26/2017   Procedure: COLONOSCOPY WITH PROPOFOL ;  Surgeon: Jinny Carmine, MD;  Location: [facility] SURGERY CNTR;  Service: Endoscopy;  Laterality: N/A;   COLONOSCOPY WITH PROPOFOL  N/A 01/16/2021   Procedure: COLONOSCOPY WITH PROPOFOL ;  Surgeon: Unk Corinn Skiff, MD;  Location: [facility] ENDOSCOPY;  Service: Gastroenterology;  Laterality: N/A;   DIAGNOSTIC LAPAROSCOPIES - MULTIPLE FOR ENDOMETRIOSIS     ESOPHAGEAL DILATION N/A 01/26/2017   Procedure: ESOPHAGEAL DILATION;  Surgeon: Jinny Carmine, MD;  Location: [facility] SURGERY CNTR;  Service: Endoscopy;  Laterality: N/A;   ESOPHAGEAL MANOMETRY N/A 05/30/2017   Procedure: ESOPHAGEAL MANOMETRY (EM);  Surgeon: Jinny Carmine, MD;  Location: [facility] ENDOSCOPY;  Service: Endoscopy;  Laterality: N/A;   ESOPHAGOGASTRODUODENOSCOPY (EGD) WITH PROPOFOL  N/A 01/26/2017   Procedure: ESOPHAGOGASTRODUODENOSCOPY (EGD) WITH PROPOFOL ;  Surgeon: Jinny Carmine, MD;  Location: [facility] SURGERY CNTR;  Service: Endoscopy;  Laterality: N/A;   ESOPHAGOGASTRODUODENOSCOPY (EGD) WITH PROPOFOL  N/A 01/30/2020   Procedure: ESOPHAGOGASTRODUODENOSCOPY (EGD) WITH PROPOFOL ;  Surgeon: Jinny Carmine, MD;  Location: [facility] SURGERY CNTR;  Service: Endoscopy;  Laterality: N/A;  sleep apnea COVID + 01-15-20   ESOPHAGOGASTRODUODENOSCOPY (EGD) WITH PROPOFOL  N/A 01/15/2021   Procedure: ESOPHAGOGASTRODUODENOSCOPY (EGD) WITH PROPOFOL ;  Surgeon: Unk Corinn Skiff, MD;  Location: [facility] ENDOSCOPY;  Service: Gastroenterology;  Laterality: N/A;   ESOPHAGOGASTRODUODENOSCOPY (EGD) WITH PROPOFOL  N/A 12/07/2021   Procedure: ESOPHAGOGASTRODUODENOSCOPY (EGD) WITH PROPOFOL ;  Surgeon: Unk Corinn Skiff, MD;  Location: [facility] ENDOSCOPY;  Service: Gastroenterology;  Laterality: N/A;    HIP ARTHROSCOPY  09/20/2011   Procedure: ARTHROSCOPY HIP;  Surgeon: Dempsey LULLA Moan, MD;  Location: [facility] ORS;  Service: Orthopedics;  Laterality: Right;  Right Hip Scope with Labral Debridement   LEFT HEART CATH AND CORONARY ANGIOGRAPHY Left 06/10/2019   Procedure: LEFT HEART CATH AND CORONARY ANGIOGRAPHY;  Surgeon: Mady Bruckner, MD;  Location: [facility] INVASIVE CV LAB;  Service: Cardiovascular;  Laterality: Left;   LOWER EXTREMITY ANGIOGRAPHY Left 01/06/2022   Procedure: Lower Extremity Angiography;  Surgeon: Jama Cordella MATSU, MD;  Location: [facility] INVASIVE CV LAB;  Service: Cardiovascular;  Laterality: Left;   NASAL SEPTUM SURGERY  MARCH 2013   IN [facility] IMPEDANCE  STUDY N/A 05/30/2017   Procedure: PH IMPEDANCE STUDY;  Surgeon: Jinny Carmine, MD;  Location: [facility] ENDOSCOPY;  Service: Endoscopy;  Laterality: N/A;   POLYPECTOMY N/A 01/26/2017   Procedure: POLYPECTOMY;  Surgeon: Jinny Carmine, MD;  Location: [facility] SURGERY CNTR;  Service: Endoscopy;  Laterality: N/A;   POSTERIOR CERVICAL FUSION/FORAMINOTOMY N/A 04/16/2013   Procedure: REMOVAL CERVICAL PLATES AND INTERBODY CAGE/POSTERIOR CERVICAL SPINAL FUSION C4 - C6/C5 CORPECTOMY/C4 - C6 FUSION WITH ILIAC CREST BONE GRAFT;  Surgeon: Donaciano Sprang, MD;  Location: [facility] OR;  Service: Orthopedics;  Laterality: N/A;   RADIOFREQUENCY ABLATION NERVES     REDUCTION MAMMAPLASTY Bilateral 05/2016   RIGHT HIP ARTHROSCOPY FOR LABRAL TEAR  ABOUT 2010   2012 also   SHOULDER ARTHROSCOPY  05-06-12   bone spur   TEE WITHOUT CARDIOVERSION N/A 08/05/2020   Procedure: TRANSESOPHAGEAL ECHOCARDIOGRAM (TEE);  Surgeon: Pietro Redell RAMAN, MD;  Location: [facility] ENDOSCOPY;  Service: Cardiovascular;  Laterality: N/A;   TONSILLECTOMY     AS A CHILD   TOTAL HIP ARTHROPLASTY Right 05/13/2012   Procedure: TOTAL HIP ARTHROPLASTY ANTERIOR APPROACH;  Surgeon: Dempsey LULLA Moan, MD;  Location: [facility] ORS;  Service: Orthopedics;  Laterality: Right;   Family History  Problem Relation Age of  Onset   Aneurysm Mother    Bipolar disorder Sister    Aneurysm Maternal Grandmother    Breast cancer Paternal Grandmother 23   Bipolar disorder Grandchild    Anxiety disorder Grandchild    Depression Grandchild    Cancer Neg Hx    Social History   Socioeconomic History   Marital status: Married    Spouse name: bruce   Number of children: 2   Years of education: Not on file   Highest education level: Associate degree: academic program  Occupational History    Comment: disabled  Tobacco Use   Smoking status: Never   Smokeless tobacco: Never   Tobacco comments:    Never smoked 01/01/23  Vaping Use   Vaping status: Never Used  Substance and Sexual Activity   Alcohol use: No    Alcohol/week: 0.0 standard drinks of alcohol   Drug use: Yes    Comment: prescribed morphine  and xanax    Sexual activity: Not Currently  Other Topics Concern   Not on file  Social History Narrative   Son, daughter  And 4 grandkids; raises 3 of the grandchildren      Lives in [place]; with husband; never smoked; no alcohol; 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    Social Drivers of Corporate investment banker Strain: Low Risk  (09/24/2023)   Overall Financial Resource Strain (CARDIA)    Difficulty of Paying Living Expenses: Not hard at all  Food Insecurity: No Food Insecurity (09/24/2023)   Hunger Vital Sign    Worried About Running Out of Food in the Last Year: Never true    Ran Out of Food in the Last Year: Never true  Transportation Needs: No Transportation Needs (09/24/2023)   PRAPARE - Administrator, Civil Service (Medical): No    Lack of Transportation (Non-Medical): No  Physical Activity: Inactive (09/24/2023)   Exercise Vital Sign    Days of Exercise per Week: 0 days    Minutes of Exercise per Session: 0 min  Stress: Stress Concern Present (09/24/2023)   [employer] of Occupational Health - Occupational Stress Questionnaire    Feeling of Stress: To some extent  Social Connections:  Socially Integrated (09/24/2023)   Social Connection and Isolation Panel  Frequency of Communication with Friends and Family: More than three times a week    Frequency of Social Gatherings with Friends and Family: More than three times a week    Attends Religious Services: 1 to 4 times per year    Active Member of [employer] or Organizations: Yes    Attends Engineer, structural: More than 4 times per year    Marital Status: Married    Tobacco Counseling Counseling given: Yes Tobacco comments: Never smoked 01/01/23    Clinical Intake:  Pre-visit preparation completed: Yes  Pain : 0-10 Pain Score: 6  Pain Type: Chronic pain Pain Location: Back Pain Orientation: Mid, Lower (has had multiple back surgies requiring cages be placed.) Pain Descriptors / Indicators: Constant Pain Onset: Other (comment) Pain Frequency: Constant     BMI - recorded: 24.8 Nutritional Status: BMI of 19-24  Normal Nutritional Risks: None Diabetes: No  Lab Results  Component Value Date   HGBA1C 5.5 02/20/2023   HGBA1C 5.5 01/19/2021   HGBA1C 5.1 01/27/2016     How often do you need to have someone help you when you read instructions, pamphlets, or other written materials from your doctor or pharmacy?: 1 - Never  Interpreter Needed?: No  Information entered by :: Stefano ORN [facility]   Activities of Daily Living     09/24/2023    9:21 AM 05/08/2023   11:47 AM  In your present state of health, do you have any difficulty performing the following activities:  Hearing? 0   Vision? 0   Difficulty concentrating or making decisions? 0   Walking or climbing stairs? 1   Dressing or bathing? 0   Doing errands, shopping? 0 0  Preparing Food and eating ? N   Using the Toilet? N   In the past six months, have you accidently leaked urine? N   Do you have problems with loss of bowel control? N   Managing your Medications? N   Managing your Finances? N   Housekeeping or managing your Housekeeping? Y      Patient Care Team: Myrla Jon HERO, MD as PCP - General (Family Medicine) Deane Rollo RAMAN, NP as Nurse Practitioner (Psychiatry) Jinny Carmine, MD as Consulting Physician (Gastroenterology) Jordis Laneta FALCON, MD as Consulting Physician (General Surgery) Tanya Glisson, MD as Referring Physician (Pain Medicine) Magalene Camellia BIRCH, DMD (Dentistry) Rennie Cindy SAUNDERS, MD as Consulting Physician (Internal Medicine) Sharron Cordella ORN, OD (Optometry) Isaiah Scrivener, MD as Consulting Physician (Pulmonary Disease) Darliss Rogue, MD as Consulting Physician (Cardiology) Cindie Ole DASEN, MD as Consulting Physician (Cardiology) Jama, Cordella MATSU, MD (Vascular Surgery)  I have updated your Care Teams any recent Medical Services you may have received from other providers in the past year.     Assessment:   This is a routine wellness examination for Toleen.  Hearing/Vision screen Hearing Screening - Comments:: Patient denies any hearing difficulties.   Vision Screening - Comments:: Wears rx glasses - up to date with routine eye exams with [facility]    Goals Addressed               This Visit's Progress     I am dealing with so much stress with my son and grandchildren (pt-stated)   On track     Current Barriers:  Chronic Mental Health needs related to anxiety and depression Mental Health Concerns  Family and relationship dysfunction Lacks knowledge of community resource: community resources to assist with financial difficulties Suicidal Ideation/Homicidal Ideation:  No  Clinical Social Work [employer]):  Over the next  90 days, patient will work with SW bi-weekly by telephone or in person to reduce or manage symptoms of anxiety and depression related to her parenting responsibilities of her 3 grandchildren and dysfunctional relationship with her son   Interventions:  Continued to explore relationship with her son explored, which remains  conflictual Explored progress of managing home schooling with her 3 grandchildren, with patient acknowledging no new improvements . Per patient, experience remains the same Followed up on information provided  on [employer] [phone] www. [employer].org and Grandparents Raising Grandchildren meets every 3rd Wednesday at 11 am at the [facility] [phone] Patient confirmed that she has not contacted the resources provided Patient encouraged to contact resources provided for added support Patient encouraged to utilized resources provided and to call this social worker with any questions or concerns regarding her mental health or community resource needs.   Patient Self Care Activities:  Performs ADL's independently Performs IADL's independently Ability for insight  Patient Coping Strengths:  Family Able to Communicate Effectively  Patient Self Care Deficits:  Difficulties in establishing and maintaining boundaries with her adult son  Please see past updates related to this goal by clicking on the Past Updates button in the selected goal        Activity and Exercise Increased   On track     Evidence-based guidance:  Review current exercise levels.  Assess patient perspective on exercise or activity level, barriers to increasing activity, motivation and readiness for change.  Recommend or set healthy exercise goal based on individual tolerance.  Encourage small steps toward making change in amount of exercise or activity.  Urge reduction of sedentary activities or screen time.  Promote group activities within the community or with family or support person.  Consider referral to rehabiliation therapist for assessment and exercise/activity plan.   Notes:       Increase water  intake   On track     Recommend increasing fluid intake to 4-6 glasses each day      LIFESTYLE - DECREASE FALLS RISK   On track     Recommend to remove any items from the home that may cause slips or trips.       Patient Stated        Raise my grandchildren       Depression Screen     09/24/2023    4:07 PM 09/12/2023   12:53 PM 06/04/2023    3:51 PM 03/12/2023   11:14 AM 02/20/2023    1:32 PM 12/29/2022    1:03 PM 12/26/2022    2:38 PM  PHQ 2/9 Scores  PHQ - 2 Score 0 0 0 0 0 0 0  PHQ- 9 Score 0  4   0     Fall Risk     09/24/2023    9:21 AM 09/12/2023   12:53 PM 06/13/2023   12:52 PM 03/12/2023   11:14 AM 02/20/2023    1:31 PM  Fall Risk   Falls in the past year? 0 0 0 1 1  Number falls in past yr: 0 0 0 1 1  Injury with Fall? 0 0 0 0 0  Risk for fall due to :     No Fall Risks  Follow up Falls evaluation completed;Education provided;Falls prevention discussed        MEDICARE RISK AT HOME:  Medicare Risk at Home Any stairs in or around the home?: (Patient-Rptd) Yes If so,  are there any without handrails?: Yes Home free of loose throw rugs in walkways, pet beds, electrical cords, etc?: (Patient-Rptd) Yes Adequate lighting in your home to reduce risk of falls?: (Patient-Rptd) Yes Life alert?: (Patient-Rptd) No Use of a cane, walker or w/c?: Yes Grab bars in the bathroom?: (Patient-Rptd) No Shower chair or bench in shower?: (Patient-Rptd) Yes Elevated toilet seat or a handicapped toilet?: (Patient-Rptd) No  TIMED UP AND GO:  Was the test performed?  No  Cognitive Function: 6CIT completed        09/24/2023    4:07 PM 09/20/2022    2:19 PM 01/20/2022   10:32 AM 01/18/2021   11:04 AM 12/07/2016   10:52 AM  6CIT Screen  What Year? 0 points 0 points 0 points 0 points 0 points  What month? 0 points 0 points 0 points 0 points 0 points  What time? 0 points 0 points 0 points 0 points 0 points  Count back from 20 0 points 0 points 0 points 0 points 0 points  Months in reverse 0 points 0 points 0 points 0 points 0 points  Repeat phrase 0 points 0 points 2 points 6 points 0 points  Total Score 0 points 0 points 2 points 6 points 0 points    Immunizations Immunization History   Administered Date(s) Administered   Influenza, Seasonal, Injecte, Preservative Fre 02/20/2023   Influenza,inj,Quad PF,6+ Mos 12/30/2015, 12/07/2016, 12/25/2017, 12/27/2018, 01/13/2020, 01/16/2021, 01/20/2022   PFIZER(Purple Top)SARS-COV-2 Vaccination 06/06/2019, 07/04/2019, 12/22/2019   PNEUMOCOCCAL CONJUGATE-20 02/20/2023   Pneumococcal Polysaccharide-23 12/30/2015   Tdap 02/27/2022    Screening Tests Health Maintenance  Topic Date Due   Zoster Vaccines- Shingrix (1 of 2) Never done   COVID-19 Vaccine (4 - 2024-25 season) 11/12/2022   INFLUENZA VACCINE  10/12/2023   MAMMOGRAM  04/10/2024   Medicare Annual Wellness (AWV)  09/23/2024   Colonoscopy  01/16/2026   DTaP/Tdap/Td (2 - Td or Tdap) 02/28/2032   Pneumococcal Vaccine 78-84 Years old  Completed   Hepatitis C Screening  Completed   HIV Screening  Completed   Hepatitis B Vaccines  Aged Out   HPV VACCINES  Aged Out   Meningococcal B Vaccine  Aged Out    Health Maintenance  Health Maintenance Due  Topic Date Due   Zoster Vaccines- Shingrix (1 of 2) Never done   COVID-19 Vaccine (4 - 2024-25 season) 11/12/2022   Health Maintenance Items Addressed: Patient advised of recommended vaccines and where to obtain those vaccines with verbal understanding  Additional Screening:  Vision Screening: Recommended annual ophthalmology exams for early detection of glaucoma and other disorders of the eye. Would you like a referral to an eye doctor? No    Dental Screening: Recommended annual dental exams for proper oral hygiene  Community Resource Referral / Chronic Care Management: CRR required this visit?  No   CCM required this visit?  No   Plan:    I have personally reviewed and noted the following in the patient's chart:   Medical and social history Use of alcohol, tobacco or illicit drugs  Current medications and supplements including opioid prescriptions. Patient is currently taking opioid prescriptions. Information  provided to patient regarding non-opioid alternatives. Patient advised to discuss non-opioid treatment plan with their provider. Functional ability and status Nutritional status Physical activity Advanced directives List of other physicians Hospitalizations, surgeries, and ER visits in previous 12 months Vitals Screenings to include cognitive, depression, and falls Referrals and appointments  In addition, I have reviewed and  discussed with patient certain preventive protocols, quality metrics, and best practice recommendations. A written personalized care plan for preventive services as well as general preventive health recommendations were provided to patient.   Lavaya Defreitas, CMA   09/24/2023   After Visit Summary: (MyChart) Due to this being a telephonic visit, the after visit summary with patients personalized plan was offered to patient via MyChart   Notes: Nothing significant to report at this time.

## 2023-10-02 ENCOUNTER — Encounter: Payer: Self-pay | Admitting: Emergency Medicine

## 2023-10-02 ENCOUNTER — Ambulatory Visit
Admission: EM | Admit: 2023-10-02 | Discharge: 2023-10-02 | Disposition: A | Discharge status: Home / Self Care | Attending: Emergency Medicine | Admitting: Emergency Medicine

## 2023-10-02 DIAGNOSIS — N390 Urinary tract infection, site not specified: Secondary | ICD-10-CM | POA: Insufficient documentation

## 2023-10-02 LAB — URINALYSIS, W/ REFLEX TO CULTURE (INFECTION SUSPECTED)
Glucose, UA: NEGATIVE mg/dL
Ketones, ur: 15 mg/dL — AB
Nitrite: NEGATIVE
Protein, ur: 100 mg/dL — AB
Specific Gravity, Urine: >1.03 — ABNORMAL HIGH (ref 1.005–1.030)
WBC, UA: >50 WBC/hpf (ref 0–5)
pH: 5.5 (ref 5.0–8.0)

## 2023-10-02 MED ORDER — NITROFURANTOIN MONOHYD MACRO 100 MG PO CAPS: 100.0000 mg | ORAL_CAPSULE | Freq: Two times a day (BID) | ORAL | 0 refills | Status: DC

## 2023-10-02 MED ORDER — PHENAZOPYRIDINE HCL 200 MG PO TABS: 200.0000 mg | ORAL_TABLET | Freq: Three times a day (TID) | ORAL | 0 refills | Status: DC

## 2023-10-02 NOTE — ED Provider Notes (Signed)
 [facility]    CSN: [id number] Arrival date & time: 10/02/23  1651      History   Chief Complaint Chief Complaint  Patient presents with   Urinary Frequency    HPI [name] is a 63 y.o. female.   HPI  63 year old female with past medical history significant for chronic pain syndrome, chronic GERD, long-term prescription opiate use, IBS, reactive airway disease, OSA, hypertension, and high cholesterol presents for evaluation of painful urination with associated urgency and frequency that started approximately 1 hour ago.  She states that she has not felt well for the past week and has been experiencing some nausea and diarrhea.  She has not seen any blood in her urine.  She denies any fever but has been experiencing sweats and chills.  Past Medical History:  Diagnosis Date   Abnormal EKG    HX OF INVERTED T WAVES ON EKG, PALPITATIONS, CHEST PAINS-CARDIAC WORK UP DID NOT SHOW ANY HEART DISEASE   AC (acromioclavicular) joint bone spurs    Acute postoperative pain 01/04/2017   Addison anemia 08/15/2004   Anemia    Iron  Infusion-8 yrs ago   Anxiety    Asthma    Cephalalgia 08/18/2014   Cervical disc disease 08/18/2014   Needs neck surgery.    Chronic headaches    Chronic, continuous use of opioids    DDD (degenerative disc disease)    CERVICAL AND LUMBAR-CHRONIC PAIN, RT HIP LABRAL TEAR   Depression    PT STATES A LOT OF STRESS IN HER LIFE   Dissociative disorder    Dizziness 04/22/2013   Duodenal ulcer with hemorrhage and perforation (HCC) 04/27/2003   Foot drop, right    FROM BACK SURGERY   GERD (gastroesophageal reflux disease)    H/O arthrodesis 08/18/2014   Headache(784.0)    AND NECK PAIN--STATES RECENT TEST SHOW CERVICAL DEGENERATION   History of blood transfusion    s/p back surgery   History of cardiac cath    a. 05/2019 Cath: Nl cors. EF 55-65%.   History of cardioversion    History of cervical spinal surgery 01/04/2015   History of  kidney stones    Hypertension    Inverted T wave    Iron  deficiency 02/01/2021   Mitral regurgitation    a. 07/2020 Echo: EF 60-65%, no rwma. Nl RV size/fxn. RVSP 39.35mmHg. Mildly to mod dil LA. Mod MR; b. 07/2020 TEE: EF 55-60%. Lambl's excresence. Nl RV size/fxn. Midly dil LA. No LA/LAA thrombus. Mild MR.   Narrowing of intervertebral disc space 08/18/2014   Currently on disability.    Orthostatic hypotension 04/22/2013   Pain    CHRONIC NECK AND BACK PAIN - LIMITED ROM NECK - S/P FUSIONS CERVICAL AND LUMBAR   Paroxysmal atrial fibrillation (HCC) 2021   a. 07/2020 s/p PVI.   Pneumonia    PONV (postoperative nausea and vomiting)    PT GIVES HX OF N&V AND FEVER WITH SURGERIES YEARS AGO--BUT NO PROBLEMS WITH MORE RECENT SURGERIES--STATES NOT MALIGNANT HYPERTHERMIA   Postop Hyponatremia 05/14/2012   Postoperative anemia due to acute blood loss 05/14/2012   PTSD (post-traumatic stress disorder)    Right foot drop    Right hip arthralgia 08/18/2014   Status post surgery of right, and now needs left.    Sleep apnea 2021   does not have a cpap   Therapeutic opioid-induced constipation (OIC)    Typical atrial flutter [facility])     Patient Active Problem List  Diagnosis Date Noted   Generalized abdominal pain 06/04/2023   Hypercoagulable state due to persistent atrial fibrillation (HCC) 05/08/2023   Persistent atrial fibrillation (HCC) 05/08/2023   History of claustrophobia 12/13/2022   PTSD (post-traumatic stress disorder)    Atrophic vaginitis 06/19/2022   Stress incontinence of urine 06/19/2022   Fall (06/13/2022) 06/14/2022   Acute left-sided low back pain without sciatica 06/14/2022   Severe episode of recurrent major depressive disorder, 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/20/2022   Pancreatic insufficiency 01/04/2022   Pseudoaneurysm of left femoral artery (HCC) 01/03/2022   High risk medication use 12/14/2021   At risk for respiratory depression due to opioid 12/14/2021    Chronic hip pain after total replacement (THR) (Right) 12/14/2021   Cervicalgia 09/10/2021   Nausea & vomiting 08/10/2021   Chronic constipation 08/04/2021   Atrial flutter (HCC)    Atrial fibrillation (HCC) 07/30/2021   Diarrhea 07/28/2021   Iron  deficiency 02/01/2021   Non-healing skin lesion 01/18/2021   Epigastric abdominal pain    Ileus (HCC) 01/14/2021   Atrial fibrillation with RVR (HCC)    Enteritis    Chest pain    Chronic shoulder pain (Right) 11/30/2020   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   Arthropathy of shoulder (Right) 11/30/2020   C7 radiculopathy (Right) 11/30/2020   Cervical radiculopathy at C8 (Right) 11/30/2020   Muscle weakness of upper extremity (Right) 11/30/2020   Weakness of upper extremity (Right) 11/30/2020   Cervical fusion syndrome 11/30/2020   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   Chronic use of opiate for therapeutic purpose 08/18/2020   Longstanding persistent atrial fibrillation (HCC) 06/30/2020   Abnormal MRI, cervical spine (05/12/2020) 06/24/2020   Chronic upper back pain 06/24/2020   Enthesopathy of shoulder (Left) 05/13/2020   Osteoarthritis of shoulder (Left) 05/13/2020   Symptoms referable to shoulder joint 05/13/2020   Tendinopathy of rotator cuff (Left) 05/13/2020   Sprain of supraspinatus muscle or tendon, sequela (Left) 05/13/2020   Subdeltoid bursitis of shoulder (Left) 05/13/2020   Subacromial bursitis of shoulder (Left) 05/13/2020   Chronic shoulder pain (Left) 05/13/2020   History of allergy to iodine 05/13/2020    Class: History of   Abnormal MRI, shoulder (Left: 03/20/2017) (Right: 12/11/2020) 04/21/2020   Encounter for monitoring dofetilide  therapy 04/21/2020   Chronic neck pain with history of cervical spinal surgery 04/21/2020   Multiple allergies 04/21/2020   S/P repair of paraesophageal hernia 02/26/2020   Heartburn    Gastritis without bleeding    Typical atrial flutter (HCC)    Foraminal  stenosis of cervical region (C3-4) (Right) 08/26/2019   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) 08/26/2019   DDD (degenerative disc disease), cervical 08/25/2019   Cervical radiculitis (C4,C5,C8) (Left) 08/20/2019   Pure hypercholesterolemia 06/27/2019   Constipation due to opioid therapy 06/27/2019   Snoring 06/27/2019   Unstable angina (HCC) 06/10/2019   Chronic musculoskeletal pain 01/14/2019   Hypertension 12/27/2018   Spondylosis without myelopathy or radiculopathy, lumbosacral region 11/12/2018   History of allergy to radiographic contrast media 11/12/2018   History of allergy to shellfish 11/12/2018   DDD (degenerative disc disease), lumbosacral 07/31/2018   Neuropathic pain 07/31/2018   Neurogenic pain 07/31/2018   Vitamin D  deficiency 05/28/2018   History of lumbar surgery 05/28/2018   Groin pain, chronic, left 05/20/2018   Other intervertebral disc degeneration, lumbar region 05/20/2018   Disorder of skeletal system 05/20/2018   Problems influencing health status 05/20/2018   Overweight 12/25/2017   Enthesopathy of  hip region (Left) 07/13/2017   Trigger point of shoulder region (Left) 02/26/2017   Pharmacologic therapy    Polyp of sigmoid colon    Problems with swallowing and mastication    Chronic shoulder arthropathy (Left) 01/04/2017   Dysphagia 12/07/2016   Abnormal flushing and sweating 12/07/2016   Long term prescription benzodiazepine use 10/11/2016   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) 10/11/2016   Chronic hip pain (2ry area of Pain) (Bilateral) (L>R) 10/11/2016   Osteoarthritis of shoulder (Bilateral) (L>R) 05/17/2016   Cervicogenic headache 07/06/2015   History of total hip replacement (THR) (Right) 07/05/2015   Chronic shoulder radicular pain (Bilateral) (L>R) 07/05/2015   Lumbar facet syndrome (Bilateral) (L>R) 07/05/2015   Chronic low back pain (1ry area of Pain) (Bilateral) (L>R) w/o sciatica 04/05/2015   Chronic  hip pain (Right) 01/04/2015   Encounter for therapeutic drug level monitoring 01/04/2015   Long term current use of opiate analgesic 01/04/2015   Long term prescription opiate use 01/04/2015   Opiate use 01/04/2015   Chronic pain syndrome 01/04/2015   Steroid intolerance 01/04/2015   Cervical spondylosis 01/04/2015   Cervical facet arthropathy (Bilateral) 01/04/2015   History of cervical spinal surgery 01/04/2015   Chronic shoulder pain (3ry area of Pain) (Bilateral) (L>R) 01/04/2015   Failed back surgical syndrome (surgery by Dr. Burnetta) 01/04/2015   Epidural fibrosis 01/04/2015   Lumbar spondylosis 01/04/2015   Cervical facet syndrome (Bilateral) 01/04/2015   Chronic sacroiliac joint pain (Left) 01/04/2015   DOE (dyspnea on exertion) 09/03/2014   Asthma, mild 08/18/2014   Blurred vision 08/18/2014   Benign paroxysmal positional nystagmus 08/18/2014   Clinical depression 08/18/2014   Fatigue 08/18/2014   Chronic GERD 08/18/2014   Cannot sleep 08/18/2014   Palpitations 08/18/2014   RAD (reactive airway disease) 08/18/2014   OSA (obstructive sleep apnea) 08/18/2014   Orthostatic hypotension 04/22/2013   Lightheadedness 04/22/2013   Chronic neck pain 04/16/2013   Anxiety state 08/03/2003   Colon, diverticulosis 07/15/2003   IBS (irritable bowel syndrome) 04/27/2003   Esophagitis, reflux 02/11/2003   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    Past Surgical History:  Procedure Laterality Date   ABDOMINAL HYSTERECTOMY     afib  05/2019   ANTERIOR CERVICAL DECOMP/DISCECTOMY FUSION N/A 10/30/2012   Procedure: ACDF C5-6, EXPLORATION AND HARDWARE REMOVAL C6-7;  Surgeon: Donaciano Burnetta, MD;  Location: [facility];  Service: Orthopedics;  Laterality: N/A;   ANTERIOR FUSION CERVICAL SPINE  MAY 2012   AT [facility]   ATRIAL FIBRILLATION ABLATION N/A 08/06/2020   Procedure: ATRIAL FIBRILLATION ABLATION;  Surgeon: Cindie Ole DASEN, MD;  Location: [facility];  Service:  Cardiovascular;  Laterality: N/A;   ATRIAL FIBRILLATION ABLATION N/A 12/04/2022   Procedure: ATRIAL FIBRILLATION ABLATION;  Surgeon: Cindie Ole DASEN, MD;  Location: [facility];  Service: Cardiovascular;  Laterality: N/A;   BACK SURGERY  2009   LUMBAR FUSION WITH RODS    BREAST BIOPSY Right 2008   benign.- bx/clip   BREAST EXCISIONAL BIOPSY Left 1998   benign   BREAST REDUCTION SURGERY Bilateral 06/2016   CARDIAC ELECTROPHYSIOLOGY MAPPING AND ABLATION     CARDIOVERSION N/A 12/03/2019   Procedure: CARDIOVERSION;  Surgeon: Darliss Rogue, MD;  Location: [facility];  Service: Cardiovascular;  Laterality: N/A;   CARDIOVERSION N/A 08/01/2021   Procedure: CARDIOVERSION;  Surgeon: Darliss Rogue, MD;  Location: [facility];  Service: Cardiovascular;  Laterality: N/A;   CARDIOVERSION N/A 08/05/2021   Procedure: CARDIOVERSION;  Surgeon:  Gollan, Timothy J, MD;  Location: [facility];  Service: Cardiovascular;  Laterality: N/A;   CARPAL TUNNEL RELEASE  05-06-12   Right   CHOLECYSTECTOMY     COLONOSCOPY WITH PROPOFOL  N/A 01/26/2017   Procedure: COLONOSCOPY WITH PROPOFOL ;  Surgeon: Jinny Carmine, MD;  Location: [facility];  Service: Endoscopy;  Laterality: N/A;   COLONOSCOPY WITH PROPOFOL  N/A 01/16/2021   Procedure: COLONOSCOPY WITH PROPOFOL ;  Surgeon: Unk Corinn Skiff, MD;  Location: [facility];  Service: Gastroenterology;  Laterality: N/A;   DIAGNOSTIC LAPAROSCOPIES - MULTIPLE FOR ENDOMETRIOSIS     ESOPHAGEAL DILATION N/A 01/26/2017   Procedure: ESOPHAGEAL DILATION;  Surgeon: Jinny Carmine, MD;  Location: [facility];  Service: Endoscopy;  Laterality: N/A;   ESOPHAGEAL MANOMETRY N/A 05/30/2017   Procedure: ESOPHAGEAL MANOMETRY (EM);  Surgeon: Jinny Carmine, MD;  Location: [facility];  Service: Endoscopy;  Laterality: N/A;   ESOPHAGOGASTRODUODENOSCOPY (EGD) WITH PROPOFOL  N/A 01/26/2017   Procedure: ESOPHAGOGASTRODUODENOSCOPY (EGD) WITH PROPOFOL ;  Surgeon: Jinny Carmine,  MD;  Location: [facility];  Service: Endoscopy;  Laterality: N/A;   ESOPHAGOGASTRODUODENOSCOPY (EGD) WITH PROPOFOL  N/A 01/30/2020   Procedure: ESOPHAGOGASTRODUODENOSCOPY (EGD) WITH PROPOFOL ;  Surgeon: Jinny Carmine, MD;  Location: [facility];  Service: Endoscopy;  Laterality: N/A;  sleep apnea COVID + 01-15-20   ESOPHAGOGASTRODUODENOSCOPY (EGD) WITH PROPOFOL  N/A 01/15/2021   Procedure: ESOPHAGOGASTRODUODENOSCOPY (EGD) WITH PROPOFOL ;  Surgeon: Unk Corinn Skiff, MD;  Location: [facility];  Service: Gastroenterology;  Laterality: N/A;   ESOPHAGOGASTRODUODENOSCOPY (EGD) WITH PROPOFOL  N/A 12/07/2021   Procedure: ESOPHAGOGASTRODUODENOSCOPY (EGD) WITH PROPOFOL ;  Surgeon: Unk Corinn Skiff, MD;  Location: [facility];  Service: Gastroenterology;  Laterality: N/A;   HIP ARTHROSCOPY  09/20/2011   Procedure: ARTHROSCOPY HIP;  Surgeon: Dempsey LULLA Moan, MD;  Location: [facility];  Service: Orthopedics;  Laterality: Right;  Right Hip Scope with Labral Debridement   LEFT HEART CATH AND CORONARY ANGIOGRAPHY Left 06/10/2019   Procedure: LEFT HEART CATH AND CORONARY ANGIOGRAPHY;  Surgeon: Mady Bruckner, MD;  Location: [facility];  Service: Cardiovascular;  Laterality: Left;   LOWER EXTREMITY ANGIOGRAPHY Left 01/06/2022   Procedure: Lower Extremity Angiography;  Surgeon: Jama Cordella MATSU, MD;  Location: [facility];  Service: Cardiovascular;  Laterality: Left;   NASAL SEPTUM SURGERY  MARCH 2013   IN [place]   [facility] IMPEDANCE STUDY N/A 05/30/2017   Procedure: PH IMPEDANCE STUDY;  Surgeon: Jinny Carmine, MD;  Location: [facility];  Service: Endoscopy;  Laterality: N/A;   POLYPECTOMY N/A 01/26/2017   Procedure: POLYPECTOMY;  Surgeon: Jinny Carmine, MD;  Location: [facility];  Service: Endoscopy;  Laterality: N/A;   POSTERIOR CERVICAL FUSION/FORAMINOTOMY N/A 04/16/2013   Procedure: REMOVAL CERVICAL PLATES AND INTERBODY CAGE/POSTERIOR CERVICAL SPINAL FUSION C4 -  C6/C5 CORPECTOMY/C4 - C6 FUSION WITH ILIAC CREST BONE GRAFT;  Surgeon: Donaciano Sprang, MD;  Location: [facility];  Service: Orthopedics;  Laterality: N/A;   RADIOFREQUENCY ABLATION NERVES     REDUCTION MAMMAPLASTY Bilateral 05/2016   RIGHT HIP ARTHROSCOPY FOR LABRAL TEAR  ABOUT 2010   2012 also   SHOULDER ARTHROSCOPY  05-06-12   bone spur   TEE WITHOUT CARDIOVERSION N/A 08/05/2020   Procedure: TRANSESOPHAGEAL ECHOCARDIOGRAM (TEE);  Surgeon: Pietro Redell RAMAN, MD;  Location: [facility] ENDOSCOPY;  Service: Cardiovascular;  Laterality: N/A;   TONSILLECTOMY     AS A CHILD   TOTAL HIP ARTHROPLASTY Right 05/13/2012   Procedure: TOTAL HIP ARTHROPLASTY ANTERIOR APPROACH;  Surgeon: Dempsey LULLA Moan, MD;  Location: [facility];  Service: Orthopedics;  Laterality: Right;    OB History     Gravida  2   Para  2   Term  2   Preterm      AB      Living  2      SAB      IAB      Ectopic      Multiple      Live Births               Home Medications    Prior to Admission medications   Medication Sig Start Date End Date Taking? Authorizing Provider  nitrofurantoin , macrocrystal-monohydrate, (MACROBID ) 100 MG capsule Take 1 capsule (100 mg total) by mouth 2 (two) times daily. 10/02/23  Yes Bernardino Ditch, NP  phenazopyridine  (PYRIDIUM ) 200 MG tablet Take 1 tablet (200 mg total) by mouth 3 (three) times daily. 10/02/23  Yes Bernardino Ditch, NP  acetaminophen  (TYLENOL ) 325 MG tablet Take 2 tablets (650 mg total) by mouth every 6 (six) hours as needed for mild pain (or Fever >/= 101). Patient taking differently: Take 1,000 mg by mouth as needed for mild pain (pain score 1-3) (or Fever >/= 101). 01/07/22   Josette Ade, MD  ALPRAZolam  (XANAX ) 1 MG tablet Take 1 mg by mouth at bedtime.  07/04/19   [provider]  apixaban  (ELIQUIS ) 5 MG TABS tablet Take 1 tablet (5 mg total) by mouth 2 (two) times daily. 06/07/23   Cindie Ole DASEN, MD  cholecalciferol (VITAMIN D3) 25 MCG (1000 UNIT) tablet Take  1,000 Units by mouth daily.    [provider]  dexlansoprazole  (DEXILANT ) 60 MG capsule TAKE 1 CAPSULE BY MOUTH EVERY DAY 09/03/23   Bacigalupo, Jon HERO, MD  dofetilide  (TIKOSYN ) 500 MCG capsule Take 1 capsule (500 mcg total) by mouth 2 (two) times daily. 05/18/23   Terra Fairy PARAS, PA-C  linaclotide  (LINZESS ) 290 MCG CAPS capsule Take 1 capsule (290 mcg total) by mouth daily before breakfast. 02/19/23   Vanga, Rohini Reddy, MD  Mometasone  Furoate (ALLERGY NASAL SPRAY NA) Place 1 spray into the nose daily as needed (allergies/ decongestion).    [provider]  montelukast  (SINGULAIR ) 10 MG tablet TAKE 1 TABLET BY MOUTH EVERY DAY 06/07/23   Bacigalupo, Jon HERO, MD  naloxone  (NARCAN ) nasal spray 4 mg/0.1 mL Place 1 spray into the nose as needed for up to 365 doses (for opioid-induced respiratory depresssion). In case of emergency (overdose), spray once into each nostril. If no response within 3 minutes, repeat application and call 911. 12/13/22 12/13/23  Tanya Glisson, MD  Oxycodone  HCl 10 MG TABS Take 1 tablet (10 mg total) by mouth every 6 (six) hours as needed. Must last 30 days. 09/24/23 10/24/23  Patel, Seema K, NP  valACYclovir  (VALTREX ) 1000 MG tablet TAKE TWO TABLETS BY MOUTH TWICE DAILY FOR ONE DAY FEVER BLISTERTAKE TWO TABLETS BY MOUTH TWICE DAILY FOR ONE DAY FEVER BLISTER 04/10/23   Bacigalupo, Jon HERO, MD    Family History Family History  Problem Relation Age of Onset   Aneurysm Mother    Bipolar disorder Sister    Aneurysm Maternal Grandmother    Breast cancer Paternal Grandmother 26   Bipolar disorder Grandchild    Anxiety disorder Grandchild    Depression Grandchild    Cancer Neg Hx     Social History Social History   Tobacco Use   Smoking status: Never   Smokeless tobacco: Never   Tobacco comments:    Never smoked 01/01/23  Vaping Use   Vaping status: Never Used  Substance Use Topics   Alcohol use: No    Alcohol/week: 0.0 standard drinks of alcohol    Drug use: Yes    Comment: prescribed morphine  and xanax      Allergies   Amoxicillin, Chlorhexidine  gluconate, Clindamycin, Codeine, Erythromycin, Penicillin g, Sulfa antibiotics, Shellfish allergy, Decadron  [dexamethasone ], Flecainide , Papaya derivatives, Betadine [povidone iodine], Other, and Prednisone    Review of Systems Review of Systems  Constitutional:  Positive for chills and diaphoresis. Negative for fever.  Gastrointestinal:  Positive for diarrhea and nausea. Negative for vomiting.  Genitourinary:  Positive for dysuria, frequency and urgency. Negative for hematuria.  Musculoskeletal:  Negative for back pain.     Physical Exam Triage Vital Signs ED Triage Vitals  Encounter Vitals Group     BP      Girls Systolic BP Percentile      Girls Diastolic BP Percentile      Boys Systolic BP Percentile      Boys Diastolic BP Percentile      Pulse      Resp      Temp      Temp src      SpO2      Weight      Height      Head Circumference      Peak Flow      Pain Score      Pain Loc      Pain Education      Exclude from Growth Chart    No data found.  Updated Vital Signs BP (!) 164/97 (BP Location: Right Arm)   Pulse 71   Temp 98.2 F (36.8 C) (Oral)   Resp 18   SpO2 97%   Visual Acuity Right Eye Distance:   Left Eye Distance:   Bilateral Distance:    Right Eye Near:   Left Eye Near:    Bilateral Near:     Physical Exam Vitals and nursing note reviewed.  Constitutional:      Appearance: Normal appearance. She is not ill-appearing.  HENT:     Head: Normocephalic and atraumatic.  Cardiovascular:     Rate and Rhythm: Normal rate and regular rhythm.     Pulses: Normal pulses.     Heart sounds: Normal heart sounds. No murmur heard.    No friction rub. No gallop.  Pulmonary:     Effort: Pulmonary effort is normal.     Breath sounds: Normal breath sounds. No wheezing, rhonchi or rales.  Abdominal:     Tenderness: There is no right CVA tenderness or  left CVA tenderness.  Skin:    General: Skin is warm and dry.     Capillary Refill: Capillary refill takes less than 2 seconds.     Findings: No rash.  Neurological:     General: No focal deficit present.     Mental Status: She is alert and oriented to person, place, and time.      UC Treatments / Results  Labs (all labs ordered are listed, but only abnormal results are displayed) Labs Reviewed  URINALYSIS, W/ REFLEX TO CULTURE (INFECTION SUSPECTED) - Abnormal; Notable for the following components:      Result Value   APPearance HAZY (*)    Specific Gravity, Urine >1.030 (*)    Hgb urine dipstick MODERATE (*)    Bilirubin Urine SMALL (*)    Ketones, ur 15 (*)    Protein, ur 100 (*)  Leukocytes,Ua SMALL (*)    Bacteria, UA FEW (*)    All other components within normal limits  URINE CULTURE    EKG   Radiology No results found.  Procedures Procedures (including critical care time)  Medications Ordered in UC Medications - No data to display  Initial Impression / Assessment and Plan / UC Course  I have reviewed the triage vital signs and the nursing notes.  Pertinent labs & imaging results that were available during my care of the patient were reviewed by me and considered in my medical decision making (see chart for details).   Patient is a nontoxic-appearing 63 year old female presenting for evaluation of acute onset UTI symptoms outlined HPI above.  She reports that she has a history of UTIs and typically they progress quickly.  Her symptoms began 1 hour prior to arrival so she came in for evaluation.  She has been experiencing some abdominal cramping along with nausea and diarrhea, most likely secondary to her IBS which would predispose her to a UTI.  She reports that she has been drinking and pushing fluids but has been voiding small amounts each time she goes.  She has a small sample in the exam room that is amber in color and cloudy.  She reports that she was able  to provide a slightly larger specimen which is currently in the lab for process.  Urinalysis is hazy in appearance with the yellow color, high specific every, moderate hemoglobin, small bilirubin, 15 ketones, 100 protein, small leukocyte esterase.  Negative for nitrites.  Reflex microscopy shows greater than 50 WBCs, 6-10 RBCs, few bacteria, WBC clumps, and calcium  oxalate crystals present.  Urine will reflex to culture.  Patient has hive allergy to amoxicillin and penicillin, erythromycin, clindamycin, and sulfa.  Therefore, I will prescribe Macrobid  twice daily for 5 days.  Also Pyridium  every 8 hours as needed for urinary discomfort.  Return precautions reviewed.   Final Clinical Impressions(s) / UC Diagnoses   Final diagnoses:  Lower urinary tract infectious disease     Discharge Instructions      Take the Macrobid  twice daily for 5 days with food for treatment of urinary tract infection.  Use the Pyridium  every 8 hours as needed for urinary discomfort.  This will turn your urine a bright red-orange.  Increase your oral fluid intake so that you increase your urine production and or flushing your urinary system.  Take an over-the-counter probiotic, such as Culturelle-Align-Activia, 1 hour after each dose of antibiotic to prevent diarrhea or yeast infections from forming.  We will culture urine and change the antibiotics if necessary.  Return for reevaluation, or see your primary care provider, for any new or worsening symptoms.      ED Prescriptions     Medication Sig Dispense Auth. Provider   nitrofurantoin , macrocrystal-monohydrate, (MACROBID ) 100 MG capsule Take 1 capsule (100 mg total) by mouth 2 (two) times daily. 10 capsule Bernardino Ditch, NP   phenazopyridine  (PYRIDIUM ) 200 MG tablet Take 1 tablet (200 mg total) by mouth 3 (three) times daily. 6 tablet Bernardino Ditch, NP      PDMP not reviewed this encounter.   Bernardino Ditch, NP 10/02/23 [phone]

## 2023-10-02 NOTE — Discharge Instructions (Addendum)
 Take the Macrobid twice daily for 5 days with food for treatment of urinary tract infection.  Use the Pyridium every 8 hours as needed for urinary discomfort.  This will turn your urine a bright red-orange.  Increase your oral fluid intake so that you increase your urine production and or flushing your urinary system.  Take an over-the-counter probiotic, such as Culturelle-Align-Activia, 1 hour after each dose of antibiotic to prevent diarrhea or yeast infections from forming.  We will culture urine and change the antibiotics if necessary.  Return for reevaluation, or see your primary care provider, for any new or worsening symptoms.

## 2023-10-02 NOTE — ED Triage Notes (Signed)
 Pt being seen in UC for uti s/s that started today. Pt reports urinary burning, frequency, and urgency with little urination. Pt reports having abdominal pain/cramps, diarrhea x1 week, and nausea. Pt reports having chills.  Pt reports not feeling well overall for 1 weeks.

## 2023-10-04 ENCOUNTER — Ambulatory Visit (HOSPITAL_COMMUNITY): Payer: Self-pay

## 2023-10-04 LAB — URINE CULTURE: Culture: 50000 — AB

## 2023-10-05 ENCOUNTER — Encounter: Payer: Self-pay | Admitting: Family Medicine

## 2023-10-05 ENCOUNTER — Ambulatory Visit (INDEPENDENT_AMBULATORY_CARE_PROVIDER_SITE_OTHER): Admitting: Family Medicine

## 2023-10-05 ENCOUNTER — Ambulatory Visit: Payer: Self-pay

## 2023-10-05 VITALS — BP 135/80 | HR 69 | Temp 98.1°F | Ht 63.0 in | Wt 138.0 lb

## 2023-10-05 DIAGNOSIS — N3000 Acute cystitis without hematuria: Secondary | ICD-10-CM

## 2023-10-05 DIAGNOSIS — R110 Nausea: Secondary | ICD-10-CM

## 2023-10-05 MED ORDER — PHENAZOPYRIDINE HCL 200 MG PO TABS: 200.0000 mg | ORAL_TABLET | Freq: Three times a day (TID) | ORAL | 0 refills | Status: DC

## 2023-10-05 MED ORDER — CIPROFLOXACIN HCL 500 MG PO TABS: 500.0000 mg | ORAL_TABLET | Freq: Two times a day (BID) | ORAL | 0 refills | Status: AC

## 2023-10-05 NOTE — Telephone Encounter (Signed)
 FYI Only or Action Required?: FYI only for provider.  Patient was last seen in primary care on 06/04/2023 by Donzella Lauraine SAILOR, DO.  Called Nurse Triage reporting No chief complaint on file..  Symptoms began several days ago.  Interventions attempted: Prescription medications: Macrobid , pyridium .  Symptoms are: unchanged.  Triage Disposition: No disposition on file.  Patient/caregiver understands and will follow disposition?:  Reason for Disposition  [1] Taking antibiotic > 72 hours (3 days) for UTI AND [2] painful urination or frequency is SAME (unchanged, not better)  Answer Assessment - Initial Assessment Questions 1. MAIN SYMPTOM: What is the main symptom you are concerned about? (e.g., painful urination, urine frequency)     Increased urgency, frequency, decreased output, burning  2. BETTER-SAME-WORSE: Are you getting better, staying the same, or getting worse compared to how you felt at your last visit to the doctor (most recent medical visit)?     Same since starting antibiotics on 10/02/23  4. FEVER: Do you have a fever? If Yes, ask: What is it, how was it measured, and when did it start?     Does not have thermometer, but reports she had the chills  5. OTHER SYMPTOMS: Do you have any other symptoms? (e.g., blood in the urine, flank pain, vaginal discharge)     Some abdominal discomfort  6. DIAGNOSIS: When was the UTI diagnosed? By whom? Was it a kidney infection, bladder infection or both?     10/02/23 at UC  7. ANTIBIOTIC: What antibiotic(s) are you taking? How many times per day?     Macrobid   8. ANTIBIOTIC - START DATE: When did you start taking the antibiotic?     10/02/23  Protocols used: Urinary Tract Infection on Antibiotic Follow-up Call - [facility] Copied from CRM #[id number]. Topic: Clinical - Red Word Triage >> Oct 05, 2023  1:12 PM Delon HERO wrote: Red Word that prompted transfer to Nurse Triage: Patient is calling to report that she was  seen in the UC on 10/02/23 for lower Urinary tract Infection.  As soon she she completed phenazopyridine  (PYRIDIUM ) 95 MG tablet [[id number]]  DISCONTINUED- symptoms returned cramping, pain, nausea, burning, with difficulty urinating constant pressure but unable to urinate. Patients fear that she will will complete nitrofurantoin , macrocrystal-monohydrate, (MACROBID ) 100 MG capsule [[id number]]  DISCONTINUED antibotic on Sunday she will feel worse.

## 2023-10-05 NOTE — Progress Notes (Signed)
 ACUTE VISIT   Patient: [name]   DOB: 1960/08/25   63 y.o. Female  MRN: [id number]   PCP: Myrla Jon HERO, MD  Chief Complaint  Patient presents with   URI    She was treated 3 days ago, still having symptoms    Subjective    HPI HPI     URI    Additional comments: She was treated 3 days ago, still having symptoms       Last edited by Nightingale Eulalio HERO, CMA on 10/05/2023  4:08 PM.       Discussed the use of AI scribe software for clinical note transcription with the patient, who gave verbal consent to proceed.  History of Present Illness [name] is a 63 year old female with atrial fibrillation who presents with persistent dysuria.  Three days ago, she was treated with Bactrim for a UTI positive for E. coli, which was pan-sensitive. She reports that her symptoms have not improved. She continues to experience urgency, burning sensation during urination, and severe bladder cramps.  She has a history of extensive allergies to clindamycin, erythromycin, penicillin, and sulfa antibiotics. She has been hospitalized multiple times for UTIs and kidney infections and has a history of kidney stones and scarring, with one kidney larger than the other.  She is currently on Tikosyn  for atrial fibrillation, started approximately six months ago. Her pulse fluctuates between 90 and 148 beats per minute, with episodes lasting over an hour. She reports that these episodes make her feel very unwell and worried. She reports nausea but no fever. She cannot take medications for nausea due to concerns about QT prolongation.  She began taking Macrobid  on Tuesday evening at 5 PM. She has run out of Pyridium , which she was taking twice a day to manage her symptoms. She has a history of her bladder dropping and pelvic wall issues and has been treated by various urologists throughout her life.     Medications: Outpatient Medications Prior to Visit  Medication Sig    acetaminophen  (TYLENOL ) 325 MG tablet Take 2 tablets (650 mg total) by mouth every 6 (six) hours as needed for mild pain (or Fever >/= 101). (Patient taking differently: Take 1,000 mg by mouth as needed for mild pain (pain score 1-3) (or Fever >/= 101).)   ALPRAZolam  (XANAX ) 1 MG tablet Take 1 mg by mouth at bedtime.    apixaban  (ELIQUIS ) 5 MG TABS tablet Take 1 tablet (5 mg total) by mouth 2 (two) times daily.   cholecalciferol (VITAMIN D3) 25 MCG (1000 UNIT) tablet Take 1,000 Units by mouth daily.   dexlansoprazole  (DEXILANT ) 60 MG capsule TAKE 1 CAPSULE BY MOUTH EVERY DAY   dofetilide  (TIKOSYN ) 500 MCG capsule Take 1 capsule (500 mcg total) by mouth 2 (two) times daily.   linaclotide  (LINZESS ) 290 MCG CAPS capsule Take 1 capsule (290 mcg total) by mouth daily before breakfast.   Mometasone  Furoate (ALLERGY NASAL SPRAY NA) Place 1 spray into the nose daily as needed (allergies/ decongestion).   montelukast  (SINGULAIR ) 10 MG tablet TAKE 1 TABLET BY MOUTH EVERY DAY   naloxone  (NARCAN ) nasal spray 4 mg/0.1 mL Place 1 spray into the nose as needed for up to 365 doses (for opioid-induced respiratory depresssion). In case of emergency (overdose), spray once into each nostril. If no response within 3 minutes, repeat application and call 911.   nitrofurantoin , macrocrystal-monohydrate, (MACROBID ) 100 MG capsule Take 1 capsule (100 mg total) by mouth  2 (two) times daily.   Oxycodone  HCl 10 MG TABS Take 1 tablet (10 mg total) by mouth every 6 (six) hours as needed. Must last 30 days.   phenazopyridine  (PYRIDIUM ) 200 MG tablet Take 1 tablet (200 mg total) by mouth 3 (three) times daily.   valACYclovir  (VALTREX ) 1000 MG tablet TAKE TWO TABLETS BY MOUTH TWICE DAILY FOR ONE DAY FEVER BLISTERTAKE TWO TABLETS BY MOUTH TWICE DAILY FOR ONE DAY FEVER BLISTER   No facility-administered medications prior to visit.    Last metabolic panel Lab Results  Component Value Date   GLUCOSE 120 (H) 05/25/2023   NA 135  05/25/2023   K 4.0 05/25/2023   CL 105 05/25/2023   CO2 22 05/25/2023   BUN 8 05/25/2023   CREATININE 0.70 05/25/2023   GFRNONAA >60 05/25/2023   CALCIUM  8.7 (L) 05/25/2023   PROT 6.5 02/20/2023   ALBUMIN 4.0 02/20/2023   LABGLOB 2.5 02/20/2023   AGRATIO 1.7 01/19/2021   BILITOT 0.4 02/20/2023   ALKPHOS 145 (H) 02/20/2023   ALKPHOS 146 (H) 02/20/2023   AST 17 02/20/2023   ALT 9 02/20/2023   ANIONGAP 8 05/25/2023        Objective    BP 135/80   Pulse 69   Temp 98.1 F (36.7 C)   Ht 5' 3 (1.6 m)   Wt 138 lb (62.6 kg)   SpO2 99%   BMI 24.45 kg/m   BP Readings from Last 3 Encounters:  10/05/23 135/80  10/02/23 (!) 164/97  09/12/23 125/82   Wt Readings from Last 3 Encounters:  10/05/23 138 lb (62.6 kg)  09/24/23 140 lb (63.5 kg)  09/12/23 140 lb (63.5 kg)      Physical Exam   Physical Exam GENERAL: Ill appearing but non-toxic. CHEST: No respiratory distress. CARDIOVASCULAR: Regular rate and rhythm. ABDOMEN: Normal bowel sounds. Abdomen nondistended, soft, no masses palpated. Suprapubic tenderness on palpation.   No results found for any visits on 10/05/23.  Assessment & Plan      Assessment & Plan Urinary Tract Infection (UTI) Persistent dysuria, urgency, and bladder cramps despite treatment with Bactrim for a UTI caused by pan-sensitive E. coli. Symptoms have not improved after three days of Macrobid , raising concern for possible pyelonephritis, as Macrobid  does not penetrate the kidney. No fever or CVA tenderness, which is a positive sign against pyelonephritis. Extensive antibiotic allergies and concurrent Tikosyn  use for AFib complicate treatment options. Ciprofloxacin  is considered due to its effectiveness and short-term use to minimize QT prolongation risk. - Prescribe ciprofloxacin  500 mg twice daily for three days. - Prescribe Pyridium  for symptomatic relief. - Monitor for improvement and adverse effects, especially QT prolongation. - Advise on  potential need for emergency department visit if symptoms worsen or do not improve.  Atrial Fibrillation (AFib) AFib managed with Tikosyn . Recent episodes of elevated pulse rates up to 148 bpm, likely exacerbated by the UTI. She reports feeling debilitated during these episodes. Concern about potential heart complications with antibiotic treatment. Ciprofloxacin  is chosen for its short-term use to minimize QT prolongation risk. - Monitor heart rate and rhythm closely during antibiotic treatment. - Educate on potential risks of QT prolongation with ciprofloxacin  and the importance of short-term use.  Nausea Nausea and dry heaves reported, likely related to the UTI and possibly exacerbated by dehydration. Unable to take anti-nausea medications due to QT prolongation concerns with Tikosyn . - Encourage adequate hydration to help alleviate nausea.      No follow-ups on file.  Rockie Agent, MD  [facility] [phone] (phone) [phone] (fax)  [facility]

## 2023-10-06 LAB — BMP8+EGFR
BUN/Creatinine Ratio: 6 — ABNORMAL LOW (ref 12–28)
BUN: 5 mg/dL — ABNORMAL LOW (ref 8–27)
CO2: 22 mmol/L (ref 20–29)
Calcium: 9.3 mg/dL (ref 8.7–10.3)
Chloride: 99 mmol/L (ref 96–106)
Creatinine, Ser: 0.83 mg/dL (ref 0.57–1.00)
Glucose: 117 mg/dL — ABNORMAL HIGH (ref 70–99)
Potassium: 3.9 mmol/L (ref 3.5–5.2)
Sodium: 137 mmol/L (ref 134–144)
eGFR: 79 mL/min/1.73 (ref >59)

## 2023-10-08 ENCOUNTER — Ambulatory Visit: Payer: Self-pay | Admitting: Family Medicine

## 2023-10-14 DIAGNOSIS — Z79899 Other long term (current) drug therapy: Secondary | ICD-10-CM | POA: Insufficient documentation

## 2023-10-14 NOTE — Progress Notes (Unsigned)
 Electrophysiology Clinic Note    Date:  10/15/2023  Patient ID:  [name], Colson Nov 15, 1960, MRN [id number] PCP:  Myrla Jon HERO, MD  Cardiologist:  Redell Cave, MD   Electrophysiologist:  OLE ONEIDA HOLTS, MD  Electrophysiology APP:  Ratasha Fabre, NP    Discussed the use of AI scribe software for clinical note transcription with the patient, who gave verbal consent to proceed.   Patient Profile    Chief Complaint: Afib, tikosyn  follow-up  History of Present Illness: [name] is a 63 y.o. female with PMH notable for persis Afib, HTN, OSA intolerant to CPAP; seen today for OLE ONEIDA HOLTS, MD for routine electrophysiology followup.   She is s/p AFib ablation 2022 by Dr. HOLTS, redo AF ablation in 2023 at [facility], and repeat AFib, atypical flutter ablation 11/2022 w Dr. HOLTS.  She had recurrence of Afib and was loaded on tikosyn  04/2023.   She last saw Dr. HOLTS 05/2023 where she reported no further AFib.   On follow-up today, she has been battling a UTI for the past 2 weeks and has noticed an increase in her AFib burden. Other than these past few weeks, her overall burden is lower than before she started tikosyn . She continues to have AF episodes that last at longest 1-2 hours.  She continues to take eliquis  BID, no bleeding concerns.     Arrhythmia/Device History Amiodarone  - stopped d/t young age after 11/2022 ablation. Patient says it was ineffective.  Flecainide  - SE: headache, lower extremity edema  Tikosyn  - loaded 04/2023    ROS:  Please see the history of present illness. All other systems are reviewed and otherwise negative.    Physical Exam    VS:  BP 134/82 (BP Location: Left Arm, Patient Position: Sitting, Cuff Size: Normal)   Pulse 65   Ht 5' 3 (1.6 m)   Wt 141 lb 9.6 oz (64.2 kg)   SpO2 98%   BMI 25.08 kg/m  BMI: Body mass index is 25.08 kg/m.      Wt Readings from Last 3 Encounters:  10/15/23 141 lb (64 kg)   10/15/23 141 lb 9.6 oz (64.2 kg)  10/05/23 138 lb (62.6 kg)     GEN- The patient is well appearing, alert and oriented x 3 today.   Lungs- Clear to ausculation bilaterally, normal work of breathing.  Heart- Regular rate and rhythm, no murmurs, rubs or gallops Extremities- No peripheral edema, warm, dry    Studies Reviewed   Previous EP, cardiology notes.    EKG is ordered. Personal review of EKG from today shows:    EKG Interpretation Date/Time:  Monday October 15 2023 12:57:16 EDT Ventricular Rate:  66 PR Interval:  124 QRS Duration:  74 QT Interval:  438 QTC Calculation: 459 R Axis:   -4  Text Interpretation: Normal sinus rhythm Confirmed by Arye Weyenberg [phone]) on 10/15/2023 1:04:38 PM    06/06/2023 EKG - SR at 68bpm; QTC  Long term monitor, 04/30/2023 HR 46 - 179 bpm, average 61 bpm. 117 nonsustained SVT, longest 17 beats. Occasional supraventricular ectopy, 3.8% Rare ventricular ectopy. No sustained arrhythmias. No atrial fibrillation.  TTE, 07/31/2021  1. Left ventricular ejection fraction, by estimation, is 55 to 60%. Left ventricular ejection fraction by 2D MOD biplane is 55.3 %. The left ventricle has normal function. The left ventricle has no regional wall motion abnormalities. There is mild left ventricular hypertrophy. Left ventricular diastolic parameters are indeterminate.   2.  Right ventricular systolic function is normal. The right ventricular size is normal.   3. The mitral valve is normal in structure. Trivial mitral valve regurgitation.   4. The aortic valve is tricuspid. Aortic valve regurgitation is trivial.    Assessment and Plan     #) persis Afib #) aflutter #) tikosyn  monitoring S/p multiple AFib, aflutter ablation She has had increased AF burden in the past few weeks iso UTI, which is not uncommon QTC today, stable compared to prior Recent K stable, will need updated mag at next appt We discussed adding a PRN AVN blocking  agent, but given that her episodes do not last longer than 1-2 hours in duration, do not think it would be helpful at this time   #) Hypercoag d/t persis afib CHA2DS2-VASc Score = at least 3 [CHF History: 0, HTN History: 1, Diabetes History: 0, Stroke History: 0, Vascular Disease History: 1, Age Score: 0, Gender Score: 1].  Therefore, the patient's annual risk of stroke is 3.2 %.    Stroke ppx - 5mg  eliquis , appropriately dosed No bleeding concerns          Current medicines are reviewed at length with the patient today.   The patient does not have concerns regarding her medicines.  The following changes were made today:  none  Labs/ tests ordered today include:  Orders Placed This Encounter  Procedures   EKG 12-Lead   EKG 12-Lead     Disposition: Follow up with Dr. Cindie or EP APP in 3-4 months    Signed, Carlson Belland, NP  10/15/23  1:57 PM  Electrophysiology [facility]

## 2023-10-15 ENCOUNTER — Ambulatory Visit: Attending: Cardiology | Admitting: Cardiology

## 2023-10-15 ENCOUNTER — Encounter: Payer: Self-pay | Admitting: Medical

## 2023-10-15 ENCOUNTER — Ambulatory Visit: Admitting: Medical

## 2023-10-15 VITALS — BP 134/82 | HR 65 | Ht 63.0 in | Wt 141.6 lb

## 2023-10-15 VITALS — BP 130/80 | HR 65 | Ht 63.0 in | Wt 141.0 lb

## 2023-10-15 DIAGNOSIS — I4819 Other persistent atrial fibrillation: Secondary | ICD-10-CM | POA: Diagnosis not present

## 2023-10-15 DIAGNOSIS — Z79899 Other long term (current) drug therapy: Secondary | ICD-10-CM

## 2023-10-15 DIAGNOSIS — D6869 Other thrombophilia: Secondary | ICD-10-CM

## 2023-10-15 DIAGNOSIS — I4892 Unspecified atrial flutter: Secondary | ICD-10-CM | POA: Diagnosis not present

## 2023-10-15 DIAGNOSIS — Z5181 Encounter for therapeutic drug level monitoring: Secondary | ICD-10-CM

## 2023-10-15 DIAGNOSIS — G4733 Obstructive sleep apnea (adult) (pediatric): Secondary | ICD-10-CM

## 2023-10-15 DIAGNOSIS — I10 Essential (primary) hypertension: Secondary | ICD-10-CM | POA: Diagnosis not present

## 2023-10-15 NOTE — Patient Instructions (Addendum)
 Medication Instructions:  The current medical regimen is effective;  continue present plan and medications as directed. Please refer to the Current Medication list given to you today.   *If you need a refill on your cardiac medications before your next appointment, please call your pharmacy*  Follow-Up: At [facility], you and your health needs are our priority.  As part of our continuing mission to provide you with exceptional heart care, our providers are all part of one team.  This team includes your primary Cardiologist (physician) and Advanced Practice Providers or APPs (Physician Assistants and Nurse Practitioners) who all work together to provide you with the care you need, when you need it.  Your next appointment:   3-4 month(s)  Provider:   Suzann Riddle, NP   We recommend signing up for the patient portal called MyChart.  Sign up information is provided on this After Visit Summary.  MyChart is used to connect with patients for Virtual Visits (Telemedicine).  Patients are able to view lab/test results, encounter notes, upcoming appointments, etc.  Non-urgent messages can be sent to your provider as well.   To learn more about what you can do with MyChart, go to [email or web address].

## 2023-10-15 NOTE — Patient Instructions (Signed)
 Medication Instructions:  Your physician recommends that you continue on your current medications as directed. Please refer to the Current Medication list given to you today.    *If you need a refill on your cardiac medications before your next appointment, please call your pharmacy*  Lab Work: No labs ordered today    Testing/Procedures: No test ordered today   Follow-Up: At [facility], you and your health needs are our priority.  As part of our continuing mission to provide you with exceptional heart care, our providers are all part of one team.  This team includes your primary Cardiologist (physician) and Advanced Practice Providers or APPs (Physician Assistants and Nurse Practitioners) who all work together to provide you with the care you need, when you need it.  Your next appointment:   1 year(s)  Provider:   Redell Cave, MD

## 2023-10-15 NOTE — Progress Notes (Signed)
 Cardiology Office Note   Date:  10/15/2023  ID:  [name], DOB 1960-05-16, MRN [id number] PCP: Myrla Jon HERO, MD  [facility] Providers Cardiologist:  Redell Cave, MD Electrophysiologist:  OLE ONEIDA HOLTS, MD  Electrophysiology APP:  Riddle, Suzann, NP    History of Present Illness [name] is a 63 y.o. female with a hx of persistent Afib, HTN, OSA who presents for 6 month follow-up.    The patient follows with EP for Afib. She underwent ablation Oct 31, 2020 and 2021/10/31 both at [facility] and repeat atypical aflutter ablation 11/2022 with Dr. HOLTS. Amiodarone  was stopped 11/2022. The patient was seen 03/2023 reporting worsening episodes of Afib and was referred back to EP. She was admitted for Tikosyn  2/25-2/28 and was discharged on 500mcg BID.   Today the patient is overall doing OK. She just saw EP this AM. She reports occasional Afib episodes. They are random and may not occur for weeks at a time. Stress seems to induce episodes. When she has afib episodes she has chest pain, SOB, palpitations and heart racing. Episodes only last a few minutes. EP did not recommend increasing Tikosyn . PRN medication may be alternative. The patient is not on blood pressure medications. BP was high last week but she had a severe UTI. She says BP is generally normal. She is not using CPAP and not interested in other masks/Inspire.    Studies Reviewed      Cardiac CT 11/01/2022   IMPRESSION: 1. There is normal pulmonary vein drainage into the left atrium. (2 on the right and 2 on the left).   2. The left atrial appendage is a Windsock-cactus type with two lobes and ostial size 25 x 19 mm and length 29 mm, Area 34 mm2. There is no thrombus in the left atrial appendage.   3. The esophagus runs in the left atrial midline and is not in the proximity to any of the pulmonary veins.   4.  Coronary calcium  score of 0.  Echo  07/2021 1. Left ventricular ejection fraction, by estimation, is  55 to 60%. Left  ventricular ejection fraction by 2D MOD biplane is 55.3 %. The left  ventricle has normal function. The left ventricle has no regional wall  motion abnormalities. There is mild left  ventricular hypertrophy. Left ventricular diastolic parameters are  indeterminate.   2. Right ventricular systolic function is normal. The right ventricular  size is normal.   3. The mitral valve is normal in structure. Trivial mitral valve  regurgitation.   4. The aortic valve is tricuspid. Aortic valve regurgitation is trivial.    Physical Exam VS:  BP 130/80 (BP Location: Left Arm, Patient Position: Sitting, Cuff Size: Normal)   Pulse 65   Ht 5' 3 (1.6 m)   Wt 141 lb (64 kg)   SpO2 98%   BMI 24.98 kg/m        Wt Readings from Last 3 Encounters:  10/15/23 141 lb (64 kg)  10/15/23 141 lb 9.6 oz (64.2 kg)  10/05/23 138 lb (62.6 kg)    GEN: Well nourished, well developed in no acute distress NECK: No JVD; No carotid bruits CARDIAC: RRR, no murmurs, rubs, gallops RESPIRATORY:  Clear to auscultation without rales, wheezing or rhonchi  ABDOMEN: Soft, non-tender, non-distended EXTREMITIES:  No edema; No deformity   ASSESSMENT AND PLAN  Persistent Afib She is overall doing well with the Tikosyn . She does have occasional afib episodes precipitated by stress. She is symptomatic with  chest pain, SOB, palpitations. Prior calcium  score of 0 in 2024. She is following with EP. EKG today shows NSR. Continue Tikosyn  500mcgBID. Qtc stable at . Continue Eliquis  for stroke ppx. PRN discussed, but would not be beneficial since episodes do not last too long.   HTN Patient is off Cardizem . BP today is 130/80, which is good. She says it is normally lower at home. She will continue to monitor this.   OSA She is not using CPAP and not interested in other masks/inspire.      Dispo: Follow-up with general cardiology in 1 year  Signed, Jaslynn Thome VEAR Fishman, PA-C

## 2023-10-17 DIAGNOSIS — R1313 Dysphagia, pharyngeal phase: Secondary | ICD-10-CM | POA: Insufficient documentation

## 2023-10-17 DIAGNOSIS — R1310 Dysphagia, unspecified: Secondary | ICD-10-CM | POA: Diagnosis not present

## 2023-10-17 DIAGNOSIS — R142 Eructation: Secondary | ICD-10-CM | POA: Insufficient documentation

## 2023-10-17 DIAGNOSIS — K5903 Drug induced constipation: Secondary | ICD-10-CM | POA: Insufficient documentation

## 2023-10-17 DIAGNOSIS — T402X5A Adverse effect of other opioids, initial encounter: Secondary | ICD-10-CM | POA: Diagnosis not present

## 2023-10-17 DIAGNOSIS — R194 Change in bowel habit: Secondary | ICD-10-CM | POA: Diagnosis not present

## 2023-10-17 DIAGNOSIS — Z9889 Other specified postprocedural states: Secondary | ICD-10-CM | POA: Diagnosis not present

## 2023-10-17 DIAGNOSIS — K219 Gastro-esophageal reflux disease without esophagitis: Secondary | ICD-10-CM | POA: Insufficient documentation

## 2023-10-17 DIAGNOSIS — Z8719 Personal history of other diseases of the digestive system: Secondary | ICD-10-CM | POA: Diagnosis not present

## 2023-10-18 ENCOUNTER — Encounter: Payer: Self-pay | Admitting: Nurse Practitioner

## 2023-10-18 ENCOUNTER — Ambulatory Visit: Payer: Self-pay

## 2023-10-18 ENCOUNTER — Ambulatory Visit: Attending: Nurse Practitioner | Admitting: Nurse Practitioner

## 2023-10-18 VITALS — BP 144/99 | HR 66 | Temp 97.3°F | Resp 16 | Ht 63.0 in | Wt 140.0 lb

## 2023-10-18 DIAGNOSIS — Z79899 Other long term (current) drug therapy: Secondary | ICD-10-CM | POA: Diagnosis not present

## 2023-10-18 DIAGNOSIS — M25551 Pain in right hip: Secondary | ICD-10-CM | POA: Insufficient documentation

## 2023-10-18 DIAGNOSIS — M47816 Spondylosis without myelopathy or radiculopathy, lumbar region: Secondary | ICD-10-CM | POA: Insufficient documentation

## 2023-10-18 DIAGNOSIS — M25552 Pain in left hip: Secondary | ICD-10-CM | POA: Insufficient documentation

## 2023-10-18 DIAGNOSIS — G8928 Other chronic postprocedural pain: Secondary | ICD-10-CM | POA: Insufficient documentation

## 2023-10-18 DIAGNOSIS — M542 Cervicalgia: Secondary | ICD-10-CM | POA: Insufficient documentation

## 2023-10-18 DIAGNOSIS — G894 Chronic pain syndrome: Secondary | ICD-10-CM | POA: Insufficient documentation

## 2023-10-18 DIAGNOSIS — Z9889 Other specified postprocedural states: Secondary | ICD-10-CM | POA: Diagnosis not present

## 2023-10-18 DIAGNOSIS — M961 Postlaminectomy syndrome, not elsewhere classified: Secondary | ICD-10-CM | POA: Insufficient documentation

## 2023-10-18 DIAGNOSIS — M545 Low back pain, unspecified: Secondary | ICD-10-CM | POA: Diagnosis not present

## 2023-10-18 DIAGNOSIS — G8929 Other chronic pain: Secondary | ICD-10-CM | POA: Insufficient documentation

## 2023-10-18 MED ORDER — OXYCODONE HCL 10 MG PO TABS: 10.0000 mg | ORAL_TABLET | Freq: Four times a day (QID) | ORAL | 0 refills | Status: AC | PRN

## 2023-10-18 NOTE — Progress Notes (Signed)
 PROVIDER NOTE: Interpretation of information contained herein should be left to medically-trained personnel. Specific patient instructions are provided elsewhere under Patient Instructions section of medical record. This document was created in part using AI and STT-dictation technology, any transcriptional errors that may result from this process are unintentional.  Patient: [name]  Service: E/M   PCP: Myrla Jon HERO, MD  DOB: 1960-04-19  DOS: 10/18/2023  Provider: Emmy MARLA Blanch, NP  MRN: [id number]  Delivery: Face-to-face  Specialty: Interventional Pain Management  Type: Established Patient  Setting: Ambulatory outpatient facility  Specialty designation: 09  Referring Prov.: Myrla Jon HERO, MD  Location: Outpatient office facility       History of present illness (HPI) [name], a 63 y.o. year old female, is here today because of her Chronic bilateral low back pain without sciatica [M54.50, G89.29]. 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   Pain Assessment: Severity of Chronic pain is reported as a 4 /10. Location: Back Lower/to left knee, also feels like left foot has a charlie horse and feels tingling and numb several times a week. Onset: More than a month ago. Quality: Sharp, Numbness, Pins and needles, Constant, Aching. Timing: Constant. Modifying  factor(s): morphine , tylenol , rest. Vitals:  height is 5' 3 (1.6 m) and weight is 140 lb (63.5 kg). Her temperature is 97.3 F (36.3 C) (abnormal). Her blood pressure is 144/99 (abnormal) and her pulse is 66. Her respiration is 16 and oxygen saturation is 99%.  BMI: Estimated body mass index is 24.8 kg/m as calculated from the following:   Height as of this encounter: 5' 3 (1.6 m).   Weight as of this encounter: 140 lb (63.5 kg).  Last encounter: 09/24/2023. Last procedure: Visit date not found.  Reason for encounter: medication management. The patient indicates doing well with current medication regimen. No adverse reaction or side effects reported to medication; however it does not provide adequate pain relief and not effective as morphine .   Pharmacotherapy Assessment   Oxycodone  HCl 10 mg tablet every 6 hours as needed for pain. MME=60 Monitoring:  PMP: PDMP reviewed during this encounter.       Pharmacotherapy: No side-effects or adverse reactions reported. Compliance: No problems identified. Effectiveness: Clinically acceptable.  Dayna Pulling, RN  10/18/2023  2:37 PM  Sign when Signing Visit Nursing Pain Medication Assessment:  Safety precautions to be maintained throughout the outpatient stay will include: orient to surroundings, keep bed in low position, maintain call bell within reach at all times, provide assistance with transfer out of bed and ambulation.  Medication Inspection Compliance: Pill count conducted under aseptic conditions, in front of the patient. Neither the pills nor the bottle was removed from the patient's sight at any time. Once count was completed pills were immediately returned to the patient in their original bottle.  Medication: Oxycodone  IR Pill/Patch Count: 34 of 120 pills/patches remain Pill/Patch Appearance: Markings consistent with prescribed medication Bottle Appearance: Standard pharmacy container. Clearly labeled. Filled Date: 7 /  14 /  2025 Last Medication intake:  Today  Oxycodone  10 mg IR does not help with pain as much as morphine  did.    UDS:  Summary  Date Value Ref Range Status  09/12/2023 FINAL  Final    Comment:    ==================================================================== [employer] [facility] (MW) ==================================================================== Test                             Result       Flag       Units  Drug Present and Declared for Prescription Verification   Alprazolam                      223          EXPECTED   ng/mg creat   Alpha-hydroxyalprazolam        570          EXPECTED   ng/mg creat    Source of alprazolam  is a scheduled prescription medication. Alpha-    hydroxyalprazolam is an expected metabolite of alprazolam .    Morphine                        >22727       EXPECTED   ng/mg creat   Normorphine                    1882         EXPECTED   ng/mg creat    Potential sources of large amounts of morphine  in the absence of    codeine include administration of morphine  or use of heroin.     Normorphine is an expected metabolite of morphine .    Hydromorphone                   480          EXPECTED   ng/mg creat    Hydromorphone  may be present as a metabolite of morphine ;    concentrations of hydromorphone  rarely exceed 5% of the morphine     concentration when this is the source of hydromorphone .  ==================================================================== Test                      Result    Flag   Units      Ref Range   Creatinine              44               mg/dL      >=79 ==================================================================== Declared Medications:  The flagging and interpretation on this report are based on the  following declared medications.  Unexpected results may arise from  inaccuracies in the declared medications.   **Note: The testing scope of this panel includes these medications:   Alprazolam  (Xanax )  Morphine  (MSIR)    **Note: The testing scope of this panel does not include the  following reported medications:   Acetaminophen  (Tylenol )  Apixaban  (Eliquis )  Cholecalciferol  Dexlansoprazole  (Dexilant )  Dofetilide  (Tikosyn )  Linaclotide  (Linzess )  Mometasone   Montelukast  (Singulair )  Naloxone  (Narcan )  Valacyclovir  (Valtrex ) ==================================================================== For clinical consultation, please call [phone]. ====================================================================     No results found for: CBDTHCR No results found for: D8THCCBX No results found for: D9THCCBX  ROS  Constitutional: Denies any fever or chills Gastrointestinal: No reported hemesis, hematochezia, vomiting, or acute GI distress Musculoskeletal: Low back pain, generalized body  pain Neurological: No reported episodes of acute onset apraxia, aphasia, dysarthria, agnosia, amnesia, paralysis, loss of coordination, or loss of consciousness  Medication Review  ALPRAZolam , Azelastine HCl, Oxycodone  HCl, acetaminophen , apixaban , cholecalciferol, dexlansoprazole , dofetilide , linaclotide , montelukast , naloxone , nitrofurantoin  (macrocrystal-monohydrate), phenazopyridine , and valACYclovir   History Review  Allergy: [name] is allergic to amoxicillin, chlorhexidine  gluconate, clindamycin, codeine, erythromycin, penicillin g, sulfa antibiotics, shellfish allergy, decadron  [dexamethasone ], flecainide , papaya derivatives, betadine [povidone iodine], other, and prednisone . Drug: [name]  reports current drug use. Alcohol:  reports no history of alcohol use. Tobacco:  reports that she has never smoked. She has never used smokeless tobacco. Social: Ms. Acero  reports that she has never smoked. She has never used smokeless tobacco. She reports current drug use. She reports that she does not drink alcohol. 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), Anemia, Anxiety, Asthma, Cephalalgia (08/18/2014), Cervical disc disease (08/18/2014), Chronic headaches, Chronic, continuous use of opioids, DDD (degenerative disc disease), Depression, Dissociative disorder, Dizziness (04/22/2013), Duodenal ulcer with hemorrhage and perforation (HCC) (04/27/2003), Foot drop, right, GERD (gastroesophageal reflux disease), H/O arthrodesis (08/18/2014), Headache(784.0), History of blood transfusion, History of cardiac cath, History of cardioversion, History of cervical spinal surgery (01/04/2015), History of kidney stones, Hypertension, Inverted T wave, Iron  deficiency (02/01/2021), Mitral regurgitation, Narrowing of intervertebral disc space (08/18/2014), Orthostatic hypotension (04/22/2013), Pain, Paroxysmal atrial fibrillation (HCC) (2021), Pneumonia, PONV (postoperative nausea and vomiting), Postop Hyponatremia (05/14/2012), Postoperative anemia due to acute blood loss (05/14/2012), PTSD (post-traumatic stress disorder), Right foot drop, Right hip arthralgia (08/18/2014), Sleep apnea (2021), Therapeutic opioid-induced constipation (OIC), and Typical atrial flutter (HCC). Surgical: [name]  has a past surgical history that includes RIGHT HIP ARTHROSCOPY FOR LABRAL TEAR (ABOUT 2010); Anterior fusion cervical spine (MAY 2012); Nasal septum surgery [facility] 2013); Back surgery (2009); Tonsillectomy; Abdominal hysterectomy; DIAGNOSTIC LAPAROSCOPIES - MULTIPLE FOR ENDOMETRIOSIS; Cholecystectomy; Hip arthroscopy (09/20/2011); Shoulder arthroscopy (05-06-12); Carpal tunnel release (05-06-12); Total hip arthroplasty (Right, 05/13/2012); Anterior cervical decomp/discectomy fusion (N/A, 10/30/2012); Posterior cervical fusion/foraminotomy (N/A, 04/16/2013); Breast reduction surgery (Bilateral, 06/2016); Colonoscopy with propofol  (N/A, 01/26/2017); Esophagogastroduodenoscopy (egd) with propofol  (N/A, 01/26/2017); Esophageal  dilation (N/A, 01/26/2017); polypectomy (N/A, 01/26/2017); PH impedance study (N/A, 05/30/2017); Esophageal manometry (N/A, 05/30/2017); Radiofrequency ablation nerves; afib (05/2019); LEFT HEART CATH AND CORONARY ANGIOGRAPHY (Left, 06/10/2019); Cardioversion (N/A, 12/03/2019); Esophagogastroduodenoscopy (egd) with propofol  (N/A, 01/30/2020); Reduction mammaplasty (Bilateral, 05/2016); Breast biopsy (Right, 2008); Breast excisional biopsy (Left, 1998); TEE without cardioversion (N/A, 08/05/2020); ATRIAL FIBRILLATION ABLATION (N/A, 08/06/2020); Cardiac electrophysiology mapping and ablation; Esophagogastroduodenoscopy (egd) with propofol  (N/A, 01/15/2021); Colonoscopy with propofol  (N/A, 01/16/2021); Cardioversion (N/A, 08/01/2021); Cardioversion (N/A, 08/05/2021); Esophagogastroduodenoscopy (egd) with propofol  (N/A, 12/07/2021); Lower Extremity Angiography (Left, 01/06/2022); and ATRIAL FIBRILLATION ABLATION (N/A, 12/04/2022). Family: family history includes Aneurysm in her maternal grandmother and mother; Anxiety disorder in her grandchild; Bipolar disorder in her grandchild and sister; Breast cancer (age of onset: 47) in her paternal grandmother; Depression in her grandchild.  Laboratory Chemistry Profile   Renal Lab Results  Component Value Date   BUN 5 (L) 10/05/2023   CREATININE 0.83 10/05/2023   BCR 6 (L) 10/05/2023   GFRAA >60 11/26/2019   GFRNONAA >60 05/25/2023    Hepatic Lab Results  Component Value Date   AST 17 02/20/2023   ALT 9 02/20/2023   ALBUMIN 4.0 02/20/2023   ALKPHOS 145 (H) 02/20/2023   ALKPHOS 146 (H) 02/20/2023   LIPASE 26 08/08/2021    Electrolytes Lab Results  Component  Value Date   NA 137 10/05/2023   K 3.9 10/05/2023   CL 99 10/05/2023   CALCIUM  9.3 10/05/2023   MG 2.2 05/18/2023    Bone Lab Results  Component Value Date   VD25OH 8.1 (L) 02/20/2023   25OHVITD1 23 (L) 11/11/2018   25OHVITD2 9.8 11/11/2018   25OHVITD3 13 11/11/2018    Inflammation (CRP: Acute  Phase) (ESR: Chronic Phase) Lab Results  Component Value Date   CRP 12 (H) 11/11/2018   ESRSEDRATE 42 (H) 11/11/2018   LATICACIDVEN 1.0 01/14/2021         Note: Above Lab results reviewed.  Recent Imaging Review  LONG TERM MONITOR (3-14 DAYS) HR 46 - 179 bpm, average 61 bpm. 117 nonsustained SVT, longest 17 beats. Occasional supraventricular ectopy, 3.8% Rare ventricular ectopy. No sustained arrhythmias. No atrial fibrillation.  Ole T. Cindie, MD, [facility], [facility] Cardiac Electrophysiology Note: Reviewed        Physical Exam  Vitals: BP (!) 144/99   Pulse 66   Temp (!) 97.3 F (36.3 C)   Resp 16   Ht 5' 3 (1.6 m)   Wt 140 lb (63.5 kg)   SpO2 99%   BMI 24.80 kg/m  BMI: Estimated body mass index is 24.8 kg/m as calculated from the following:   Height as of this encounter: 5' 3 (1.6 m).   Weight as of this encounter: 140 lb (63.5 kg). Ideal: Ideal body weight: 52.4 kg (115 lb 8.3 oz) Adjusted ideal body weight: 56.8 kg (125 lb 5 oz) General appearance: Well nourished, well developed, and well hydrated. In no apparent acute distress Mental status: Alert, oriented x 3 (person, place, & time)       Respiratory: No evidence of acute respiratory distress Eyes: PERLA   Assessment   Diagnosis Status  1. Chronic low back pain (1ry area of Pain) (Bilateral) (L>R) w/o sciatica   2. Chronic hip pain (2ry area of Pain) (Bilateral) (L>R)   3. Chronic pain syndrome   4. Failed back syndrome of cervical spine   5. Chronic neck pain with history of cervical spinal surgery   6. Lumbar facet syndrome (Bilateral) (L>R)   7. Pharmacologic therapy   8. Medication management    Controlled Controlled Controlled   Updated Problems: No problems updated.  Plan of Care  Problem-specific:  Assessment and Plan Will continue on current medication regimen.  Prescribing drug monitoring (PMP) reviewed; findings consistent with the use of prescribed medication and no evidence of  narcotic misuse or abuse.  Urine drug screening (UDS) up-to-date.  Schedule follow-up in 30 days for medication management evaluation.  No other new issues or problems reported to this visit.  [name] has a current medication list which includes the following long-term medication(s): apixaban , dexlansoprazole , dofetilide , azelastine hcl, montelukast , and linaclotide .  Pharmacotherapy (Medications Ordered): Meds ordered this encounter  Medications   Oxycodone  HCl 10 MG TABS    Sig: Take 1 tablet (10 mg total) by mouth every 6 (six) hours as needed. Must last 30 days.    Dispense:  120 tablet    Refill:  0    Chronic Pain: STOP Act (Not applicable) Fill 1 day early if closed on refill date. Avoid benzodiazepines within 8 hours of opioids          Return in about 1 month (around 11/18/2023) for (F2F), (MM), Emmy Blanch NP.    Recent Visits Date Type Provider Dept  09/24/23 Office Visit Sarinah Doetsch K, NP [facility]  09/12/23 Office Visit Reynard Christoffersen K, NP [facility]  Showing recent visits within past 90 days and meeting all other requirements Today's Visits Date Type Provider Dept  10/18/23 Office Visit Cayle Thunder K, NP [facility]  Showing today's visits and meeting all other requirements Future Appointments Date Type Provider Dept  11/21/23 Appointment Jakeria Caissie K, NP [facility]  12/10/23 Appointment Cornie Mccomber K, NP [facility]  Showing future appointments within next 90 days and meeting all other requirements  I discussed the assessment and treatment plan with the patient. The patient was provided an opportunity to ask questions and all were answered. The patient agreed with the plan and demonstrated an understanding of the instructions.  Patient advised to call back or seek an in-person evaluation if the symptoms or condition worsens.  Duration of encounter: 30 minutes.  Total time on encounter, as per  AMA guidelines included both the face-to-face and non-face-to-face time personally spent by the physician and/or other qualified health care professional(s) on the day of the encounter (includes time in activities that require the physician or other qualified health care professional and does not include time in activities normally performed by clinical staff). Physician's time may include the following activities when performed: Preparing to see the patient (e.g., pre-charting review of records, searching for previously ordered imaging, lab work, and nerve conduction tests) Review of prior analgesic pharmacotherapies. Reviewing PMP Interpreting ordered tests (e.g., lab work, imaging, nerve conduction tests) Performing post-procedure evaluations, including interpretation of diagnostic procedures Obtaining and/or reviewing separately obtained history Performing a medically appropriate examination and/or evaluation Counseling and educating the patient/family/caregiver Ordering medications, tests, or procedures Referring and communicating with other health care professionals (when not separately reported) Documenting clinical information in the electronic or other health record Independently interpreting results (not separately reported) and communicating results to the patient/ family/caregiver Care coordination (not separately reported)  Note by: Destry Dauber K Evalette Montrose, NP (TTS and AI technology used. I apologize for any typographical errors that were not detected and corrected.) Date: 10/18/2023; Time: 3:15 PM

## 2023-10-18 NOTE — Progress Notes (Signed)
 Nursing Pain Medication Assessment:  Safety precautions to be maintained throughout the outpatient stay will include: orient to surroundings, keep bed in low position, maintain call bell within reach at all times, provide assistance with transfer out of bed and ambulation.  Medication Inspection Compliance: Pill count conducted under aseptic conditions, in front of the patient. Neither the pills nor the bottle was removed from the patient's sight at any time. Once count was completed pills were immediately returned to the patient in their original bottle.  Medication: Oxycodone  IR Pill/Patch Count: 34 of 120 pills/patches remain Pill/Patch Appearance: Markings consistent with prescribed medication Bottle Appearance: Standard pharmacy container. Clearly labeled. Filled Date: 7 / 72 / 2025 Last Medication intake:  Today  Oxycodone  10 mg IR does not help with pain as much as morphine  did.

## 2023-10-18 NOTE — Telephone Encounter (Signed)
 Noted

## 2023-10-18 NOTE — Telephone Encounter (Signed)
 FYI Only or Action Required?: FYI only for provider.  Patient was last seen in primary care on 10/05/2023 by Sharma Coyer, MD.  Called Nurse Triage reporting urinary symptoms.  Symptoms began yesterday.  Interventions attempted: Rest, hydration, or home remedies.  Symptoms are: gradually worsening.  Triage Disposition: See Physician Within 24 Hours  Patient/caregiver understands and will follow disposition?: Yes   Message from [place] T sent at 10/18/2023  3:24 PM EDT  UTI coming back, having frequency, pain, [phone]   Reason for Disposition  Urinating more frequently than usual (i.e., frequency) OR new-onset of the feeling of an urgent need to urinate (i.e., urgency)  Answer Assessment - Initial Assessment Questions 1. SYMPTOM: What's the main symptom you're concerned about? (e.g., frequency, incontinence)     Frequency, burning  2. ONSET: When did the  burning  start?     10/17/23 3. PAIN: Is there any pain? If Yes, ask: How bad is it? (Scale: 1-10; mild, moderate, severe)     5/10 4. CAUSE: What do you think is causing the symptoms?     Uti  5. OTHER SYMPTOMS: Do you have any other symptoms? (e.g., blood in urine, fever, flank pain, pain with urination)     Denies  Additional info: 1) Diagnosed with UTI on 10/05/23, she completed 3 day course of cipro  which resolved her symptoms. Began with dysuria and frequency 10/17/23.  2) Offered next available acute visit at practice location on 10/19/23 she declines this appointment due to other commitments, she plans on proceeding to urgent care today.  Protocols used: Urinary Symptoms-A-AH

## 2023-10-19 ENCOUNTER — Other Ambulatory Visit: Payer: Self-pay | Admitting: Internal Medicine

## 2023-10-19 DIAGNOSIS — R1313 Dysphagia, pharyngeal phase: Secondary | ICD-10-CM

## 2023-10-24 ENCOUNTER — Ambulatory Visit
Admission: RE | Admit: 2023-10-24 | Discharge: 2023-10-24 | Disposition: A | Discharge status: Home / Self Care | Source: Ambulatory Visit | Attending: Internal Medicine | Admitting: Internal Medicine

## 2023-10-24 DIAGNOSIS — K224 Dyskinesia of esophagus: Secondary | ICD-10-CM | POA: Diagnosis not present

## 2023-10-24 DIAGNOSIS — R1313 Dysphagia, pharyngeal phase: Secondary | ICD-10-CM | POA: Insufficient documentation

## 2023-10-24 DIAGNOSIS — K449 Diaphragmatic hernia without obstruction or gangrene: Secondary | ICD-10-CM | POA: Diagnosis not present

## 2023-11-05 ENCOUNTER — Telehealth: Payer: Self-pay | Admitting: Cardiology

## 2023-11-05 MED ORDER — DOFETILIDE 500 MCG PO CAPS: 500.0000 ug | ORAL_CAPSULE | Freq: Two times a day (BID) | ORAL | 3 refills | Status: AC

## 2023-11-05 NOTE — Telephone Encounter (Signed)
Pt's medication was sent to pt's pharmacy as requested confirmation received.  °

## 2023-11-05 NOTE — Telephone Encounter (Signed)
*  STAT* If patient is at the pharmacy, call can be transferred to refill team.   1. Which medications need to be refilled? (please list name of each medication and dose if known) dofetilide  (TIKOSYN ) 500 MCG capsule    2. Would you like to learn more about the convenience, safety, & potential cost savings by using the [facility]?     3. Are you open to using the [facility] (Type [facility].  ).   4. Which pharmacy/location (including street and city if local pharmacy) is medication to be sent to?  [id number] - [place], [place] - [address]     5. Do they need a 30 day or 90 day supply? 90 day

## 2023-11-14 ENCOUNTER — Ambulatory Visit: Payer: Self-pay

## 2023-11-14 ENCOUNTER — Emergency Department
Admission: EM | Admit: 2023-11-14 | Discharge: 2023-11-14 | Disposition: A | Discharge status: Home / Self Care | Attending: Emergency Medicine | Admitting: Emergency Medicine

## 2023-11-14 ENCOUNTER — Other Ambulatory Visit: Payer: Self-pay

## 2023-11-14 ENCOUNTER — Emergency Department

## 2023-11-14 DIAGNOSIS — H938X2 Other specified disorders of left ear: Secondary | ICD-10-CM | POA: Diagnosis not present

## 2023-11-14 DIAGNOSIS — I10 Essential (primary) hypertension: Secondary | ICD-10-CM | POA: Diagnosis not present

## 2023-11-14 DIAGNOSIS — R519 Headache, unspecified: Secondary | ICD-10-CM | POA: Insufficient documentation

## 2023-11-14 DIAGNOSIS — Z7901 Long term (current) use of anticoagulants: Secondary | ICD-10-CM | POA: Diagnosis not present

## 2023-11-14 DIAGNOSIS — I482 Chronic atrial fibrillation, unspecified: Secondary | ICD-10-CM | POA: Diagnosis not present

## 2023-11-14 DIAGNOSIS — H538 Other visual disturbances: Secondary | ICD-10-CM | POA: Insufficient documentation

## 2023-11-14 DIAGNOSIS — R110 Nausea: Secondary | ICD-10-CM | POA: Insufficient documentation

## 2023-11-14 DIAGNOSIS — R29818 Other symptoms and signs involving the nervous system: Secondary | ICD-10-CM | POA: Diagnosis not present

## 2023-11-14 LAB — CBC
HCT: 41 % (ref 36.0–46.0)
Hemoglobin: 13.6 g/dL (ref 12.0–15.0)
MCH: 30.1 pg (ref 26.0–34.0)
MCHC: 33.2 g/dL (ref 30.0–36.0)
MCV: 90.7 fL (ref 80.0–100.0)
Platelets: 370 K/uL (ref 150–400)
RBC: 4.52 MIL/uL (ref 3.87–5.11)
RDW: 13.2 % (ref 11.5–15.5)
WBC: 5.3 K/uL (ref 4.0–10.5)
nRBC: 0 % (ref 0.0–0.2)

## 2023-11-14 LAB — COMPREHENSIVE METABOLIC PANEL WITH GFR
ALT: 9 U/L (ref 0–44)
AST: 15 U/L (ref 15–41)
Albumin: 4 g/dL (ref 3.5–5.0)
Alkaline Phosphatase: 113 U/L (ref 38–126)
Anion gap: 11 (ref 5–15)
BUN: 8 mg/dL (ref 8–23)
CO2: 24 mmol/L (ref 22–32)
Calcium: 9.2 mg/dL (ref 8.9–10.3)
Chloride: 102 mmol/L (ref 98–111)
Creatinine, Ser: 0.75 mg/dL (ref 0.44–1.00)
GFR, Estimated: >60 mL/min (ref >60)
Glucose, Bld: 133 mg/dL — ABNORMAL HIGH (ref 70–99)
Potassium: 3.4 mmol/L — ABNORMAL LOW (ref 3.5–5.1)
Sodium: 137 mmol/L (ref 135–145)
Total Bilirubin: 0.9 mg/dL (ref 0.0–1.2)
Total Protein: 7.4 g/dL (ref 6.5–8.1)

## 2023-11-14 LAB — DIFFERENTIAL
Abs Immature Granulocytes: 0.01 K/uL (ref 0.00–0.07)
Basophils Absolute: 0.1 K/uL (ref 0.0–0.1)
Basophils Relative: 1 %
Eosinophils Absolute: 0.1 K/uL (ref 0.0–0.5)
Eosinophils Relative: 1 %
Immature Granulocytes: 0 %
Lymphocytes Relative: 28 %
Lymphs Abs: 1.5 K/uL (ref 0.7–4.0)
Monocytes Absolute: 0.3 K/uL (ref 0.1–1.0)
Monocytes Relative: 5 %
Neutro Abs: 3.4 K/uL (ref 1.7–7.7)
Neutrophils Relative %: 65 %

## 2023-11-14 LAB — PROTIME-INR
INR: 1.2 (ref 0.8–1.2)
Prothrombin Time: 16.1 s — ABNORMAL HIGH (ref 11.4–15.2)

## 2023-11-14 LAB — ETHANOL: Alcohol, Ethyl (B): <15 mg/dL (ref <15)

## 2023-11-14 LAB — APTT: aPTT: 39 s — ABNORMAL HIGH (ref 24–36)

## 2023-11-14 MED ORDER — MECLIZINE HCL 25 MG PO TABS: 25.0000 mg | ORAL_TABLET | Freq: Three times a day (TID) | ORAL | 0 refills | Status: DC | PRN

## 2023-11-14 MED ORDER — SODIUM CHLORIDE 0.9% FLUSH: 3.0000 mL | Freq: Once | INTRAVENOUS | Status: DC

## 2023-11-14 MED ORDER — FLUTICASONE PROPIONATE HFA 44 MCG/ACT IN AERO: 1.0000 | INHALATION_SPRAY | Freq: Every day | RESPIRATORY_TRACT | 0 refills | Status: DC | PRN

## 2023-11-14 MED ORDER — CETIRIZINE HCL 10 MG PO TABS: 10.0000 mg | ORAL_TABLET | Freq: Every day | ORAL | 0 refills | Status: DC | PRN

## 2023-11-14 NOTE — Telephone Encounter (Signed)
 I would agree with recommendation for urgent evaluation and imaging for worsening severity of headache instead of waiting another day with symptoms

## 2023-11-14 NOTE — ED Provider Notes (Signed)
 [facility] Provider Note    Event Date/Time   First MD Initiated Contact with Patient 11/14/23 2027     (approximate)   History   Headache   HPI  [name] is a 63 year old female with history of A-fib on Eliquis , HTN presenting to the emergency department for evaluation of headaches.  For the past several weeks, patient has had intermittent headaches with associated nausea and blurred vision.  Denies double vision.  This morning when she woke up noticed that her headache was more severe.  No associated numbness, tingling, focal weakness.  She spoke with her primary care office who was concerned for possible stroke and directed her to the ER.  She currently reports that her headache is largely improved.  She does report fullness over her left ear with some occasional dizziness described as a spinning sensation and is concerned about inner ear pathology.  No recent head injury or other trauma.     Physical Exam   Triage Vital Signs: ED Triage Vitals  Encounter Vitals Group     BP 11/14/23 1357 (!) 171/104     Girls Systolic BP Percentile --      Girls Diastolic BP Percentile --      Boys Systolic BP Percentile --      Boys Diastolic BP Percentile --      Pulse Rate 11/14/23 1357 69     Resp 11/14/23 1357 18     Temp 11/14/23 1357 98.3 F (36.8 C)     Temp Source 11/14/23 1357 Oral     SpO2 11/14/23 1357 100 %     Weight 11/14/23 1354 137 lb (62.1 kg)     Height 11/14/23 1354 5' 4 (1.626 m)     Head Circumference --      Peak Flow --      Pain Score 11/14/23 1353 8     Pain Loc --      Pain Education --      Exclude from Growth Chart --     Most recent vital signs: Vitals:   11/14/23 2030 11/14/23 2045  BP: (!) 154/83   Pulse: 65 68  Resp:    Temp:    SpO2: 100% 100%     General: Awake, interactive  HEENT: Small on of fluid behind left panic membrane, not red or bulging.  Pupils equal and reactive.  IOP 17 on the left, 15 on  the right. CV:  Regular rate, good peripheral perfusion.  Resp:  Unlabored respirations.  Abd:  Nondistended.  Neuro:  Alert and oriented, normal extraocular movements, symmetric facial movement, sensation intact over bilateral upper and lower extremities with 5 out of 5 strength.  Normal finger-to-nose testing.   ED Results / Procedures / Treatments   Labs (all labs ordered are listed, but only abnormal results are displayed) Labs Reviewed  PROTIME-INR - Abnormal; Notable for the following components:      Result Value   Prothrombin Time 16.1 (*)    All other components within normal limits  APTT - Abnormal; Notable for the following components:   aPTT 39 (*)    All other components within normal limits  COMPREHENSIVE METABOLIC PANEL WITH GFR - Abnormal; Notable for the following components:   Potassium 3.4 (*)    Glucose, Bld 133 (*)    All other components within normal limits  CBC  DIFFERENTIAL  ETHANOL  CBG MONITORING, ED     EKG EKG independently reviewed and  interpreted by myself demonstrates:  EKG demonstrates normal sinus rhythm rate 70, PR 120, QRS 80, QTc 440, no acute ST changes  RADIOLOGY Imaging independently reviewed and interpreted by myself demonstrates:  CT head without acute bleed, no mastoid effusion noted by radiology  Formal Radiology Read:  CT HEAD WO CONTRAST Result Date: 11/14/2023 CLINICAL DATA:  Neuro deficit, acute, stroke suspected Headache.  Nausea and blurry vision. EXAM: CT HEAD WITHOUT CONTRAST TECHNIQUE: Contiguous axial images were obtained from the base of the skull through the vertex without intravenous contrast. RADIATION DOSE REDUCTION: This exam was performed according to the departmental dose-optimization program which includes automated exposure control, adjustment of the mA and/or kV according to patient size and/or use of iterative reconstruction technique. COMPARISON:  Head CT 03/06/2015 FINDINGS: Brain: No intracranial hemorrhage,  mass effect, or midline shift. Brain volume is normal for age. No hydrocephalus. The basilar cisterns are patent. No evidence of territorial infarct or acute ischemia. No extra-axial or intracranial fluid collection. Vascular: No hyperdense vessel or unexpected calcification. Skull: No fracture or focal lesion. Sinuses/Orbits: Trace fluid in the sphenoid sinuses, improved from prior exam. Paranasal sinuses are otherwise clear. No mastoid effusion. Other: None. IMPRESSION: No acute intracranial abnormality. Electronically Signed   By: Andrea Gasman M.D.   On: 11/14/2023 15:26    PROCEDURES:  Critical Care performed: No  Procedures   MEDICATIONS ORDERED IN ED: Medications  sodium chloride  flush (NS) 0.9 % injection 3 mL (has no administration in time range)     IMPRESSION / MDM / ASSESSMENT AND PLAN / ED COURSE  I reviewed the triage vital signs and the nursing notes.  Differential diagnosis includes, but is not limited to, intracranial bleed, benign headache, eustachian tube dysfunction, much lower suspicion SAH, meningitis, other emergent process given improved symptoms without intervention, reassuring neurologic exam  Patient's presentation is most consistent with acute presentation with potential threat to life or bodily function.  63 year old female presenting to the emergency department for evaluation of headache.  Reassuring neurologic exam.  Labs including CBC, CMP reassuring.  Normal INR.  CT head obtained without acute findings.  Discussed symptomatic treatment here.  Patient is very concerned about drug interactions with her Tikosyn .  Do note possible interactions with Benadryl , Compazine .  After discussion of risk and benefits with patient, she is comfortable with discharge with treatment for eustachian tube dysfunction and possible peripheral vertigo.  I do not note any interactions with cetirizine , fluticasone , and meclizine .  Prescriptions for these sent to patient's pharmacy.   Strict return precautions provided.  Patient discharged in stable condition.      FINAL CLINICAL IMPRESSION(S) / ED DIAGNOSES   Final diagnoses:  Acute nonintractable headache, unspecified headache type  Ear fullness, left     Rx / DC Orders   ED Discharge Orders          Ordered    meclizine  (ANTIVERT ) 25 MG tablet  3 times daily PRN        11/14/23 2215    cetirizine  (ZYRTEC  ALLERGY) 10 MG tablet  Daily PRN        11/14/23 2215    fluticasone  (FLOVENT  HFA) 44 MCG/ACT inhaler  Daily PRN        11/14/23 2215             Note:  This document was prepared using Dragon voice recognition software and may include unintentional dictation errors.   Levander Slate, MD 11/14/23 2215

## 2023-11-14 NOTE — Discharge Instructions (Signed)
 You were seen in the ER today for evaluation of your headache.  Your testing was fortunately overall reassuring.  I sent prescriptions for medication to treat possible eustachian tube dysfunction and peripheral vertigo to your pharmacy.  Follow-up your primary care doctor for further evaluation.  Of also included as needed follow-up with ENT if your symptoms or not improving.  Return to the ER for new or worsening symptoms.

## 2023-11-14 NOTE — ED Triage Notes (Signed)
 Pt via POV from home. Pt c/o headache intermittent for the past weeks, states the headache is worse today. Pt also reports nausea and blurred vision. Reports states that when to sleep and was okay. LKW was 10pm last night. Pt does take Eliquis  and has hx of a fib. Pt is A&Ox4 and NAD

## 2023-11-14 NOTE — Telephone Encounter (Signed)
 Called patient and reported that she was already in the ER.

## 2023-11-14 NOTE — Telephone Encounter (Signed)
 FYI Only or Action Required?: Action required by provider: update on patient condition.  Patient was last seen in primary care on 10/05/2023 by Sharma Coyer, MD.  Called Nurse Triage reporting Headache.  Symptoms began several weeks ago.  Interventions attempted: OTC medications: tylenol .  Symptoms are: gradually worsening.  Triage Disposition: Go to ED Now (Notify PCP)  Patient/caregiver understands and will follow disposition?: UnsureCopied from CRM #[id number]. Topic: Clinical - Red Word Triage >> Nov 14, 2023 12:12 PM Carla L wrote: Red Word that prompted transfer to Nurse Triage: severe headaches for two weeks Reason for Disposition  [1] SEVERE headache (e.g., excruciating) AND [2] worst headache of life  Answer Assessment - Initial Assessment Questions Headache started 2 weeks ago but has been able to take tylenol  and it goes away. Last two days the tylenol  isn't helping. Pt stated she has  blurred vision but feels it is due to eyes.  After appt was made, pt stated I'm on blood thinners. RN advised to go to ED and pt is refusing at this time. RN attempted to notify [facility], but no answer. RN didn't cancel appt for tomorrow. Please advise.     1. LOCATION: Where does it hurt?      Forehead, behind eyes/sinus 2. ONSET: When did the headache start? (e.g., minutes, hours, days)      2 weeks  3. PATTERN: Does the pain come and go, or has it been constant since it started?     Comes and go 4. SEVERITY: How bad is the pain? and What does it keep you from doing?  (e.g., Scale 1-10; mild, moderate, or severe)     8 5. RECURRENT SYMPTOM: Have you ever had headaches before? If Yes, ask: When was the last time? and What happened that time?      yes 6. CAUSE: What do you think is causing the headache?     Not sure 7. MIGRAINE: Have you been diagnosed with migraine headaches? If Yes, ask: Is this headache similar?      na 8. HEAD INJURY: Has there been  any recent injury to your head?      denies 9. OTHER SYMPTOMS: Do you have any other symptoms? (e.g., fever, stiff neck, eye pain, sore throat, cold symptoms)     Blurred vision  Protocols used: Headache-A-AH  Reason for Disposition  [1] MODERATE headache (e.g., interferes with normal activities) AND [2] present > 24 hours AND [3] unexplained  (Exceptions: Pain medicines not tried, typical migraine, or headache part of viral illness.)  Answer Assessment - Initial Assessment Questions Headache started 2 weeks ago but has been able to take tylenol  and it goes away. Last two days the tylenol  isn't helping.     1. LOCATION: Where does it hurt?      Forehead, behind eyes/sinus 2. ONSET: When did the headache start? (e.g., minutes, hours, days)      2 weeks  3. PATTERN: Does the pain come and go, or has it been constant since it started?     Comes and go 4. SEVERITY: How bad is the pain? and What does it keep you from doing?  (e.g., Scale 1-10; mild, moderate, or severe)     8 5. RECURRENT SYMPTOM: Have you ever had headaches before? If Yes, ask: When was the last time? and What happened that time?      yes 6. CAUSE: What do you think is causing the headache?     Not sure 7. MIGRAINE: Have  you been diagnosed with migraine headaches? If Yes, ask: Is this headache similar?      na 8. HEAD INJURY: Has there been any recent injury to your head?      denies 9. OTHER SYMPTOMS: Do you have any other symptoms? (e.g., fever, stiff neck, eye pain, sore throat, cold symptoms)     Blurred vision  Protocols used: Headache-A-AH  Reason for Disposition  [1] MODERATE headache (e.g., interferes with normal activities) AND [2] present > 24 hours AND [3] unexplained  (Exceptions: Pain medicines not tried, typical migraine, or headache part of viral illness.)  [1] SEVERE headache (e.g., excruciating) AND [2] worst headache of life  Answer Assessment - Initial Assessment  Questions Headache started 2 weeks ago but has been able to take tylenol  and it goes away. Last two days the tylenol  isn't helping.     1. LOCATION: Where does it hurt?      Forehead, behind eyes/sinus 2. ONSET: When did the headache start? (e.g., minutes, hours, days)      2 weeks  3. PATTERN: Does the pain come and go, or has it been constant since it started?     Comes and go 4. SEVERITY: How bad is the pain? and What does it keep you from doing?  (e.g., Scale 1-10; mild, moderate, or severe)     8 5. RECURRENT SYMPTOM: Have you ever had headaches before? If Yes, ask: When was the last time? and What happened that time?      yes 6. CAUSE: What do you think is causing the headache?     Not sure 7. MIGRAINE: Have you been diagnosed with migraine headaches? If Yes, ask: Is this headache similar?      na 8. HEAD INJURY: Has there been any recent injury to your head?      denies 9. OTHER SYMPTOMS: Do you have any other symptoms? (e.g., fever, stiff neck, eye pain, sore throat, cold symptoms)     Blurred vision  Protocols used: Headache-A-AH  Reason for Disposition  [1] SEVERE headache (e.g., excruciating) AND [2] worst headache of life  Answer Assessment - Initial Assessment Questions Headache started 2 weeks ago but has been able to take tylenol  and it goes away. Last two days the tylenol  isn't helping.     1. LOCATION: Where does it hurt?      Forehead, behind eyes/sinus 2. ONSET: When did the headache start? (e.g., minutes, hours, days)      2 weeks  3. PATTERN: Does the pain come and go, or has it been constant since it started?     Comes and go 4. SEVERITY: How bad is the pain? and What does it keep you from doing?  (e.g., Scale 1-10; mild, moderate, or severe)     8 5. RECURRENT SYMPTOM: Have you ever had headaches before? If Yes, ask: When was the last time? and What happened that time?      yes 6. CAUSE: What do you think  is causing the headache?     Not sure 7. MIGRAINE: Have you been diagnosed with migraine headaches? If Yes, ask: Is this headache similar?      na 8. HEAD INJURY: Has there been any recent injury to your head?      denies 9. OTHER SYMPTOMS: Do you have any other symptoms? (e.g., fever, stiff neck, eye pain, sore throat, cold symptoms)     Blurred vision  Protocols used: Headache-A-AH  Reason for Disposition  [1]  SEVERE headache (e.g., excruciating) AND [2] worst headache of life  Answer Assessment - Initial Assessment Questions Headache started 2 weeks ago but has been able to take tylenol  and it goes away. Last two days the tylenol  isn't helping. Pt stated she has  blurred vision but feels it is due to eyes.  After appt was made, pt stated I'm on blood thinners. RN advised to go to ED and pt is refusing at this time. RN attempted to notify call, but no answer. RN didn't cancel appt for tomorrow. Please advise.     1. LOCATION: Where does it hurt?      Forehead, behind eyes/sinus 2. ONSET: When did the headache start? (e.g., minutes, hours, days)      2 weeks  3. PATTERN: Does the pain come and go, or has it been constant since it started?     Comes and go 4. SEVERITY: How bad is the pain? and What does it keep you from doing?  (e.g., Scale 1-10; mild, moderate, or severe)     8 5. RECURRENT SYMPTOM: Have you ever had headaches before? If Yes, ask: When was the last time? and What happened that time?      yes 6. CAUSE: What do you think is causing the headache?     Not sure 7. MIGRAINE: Have you been diagnosed with migraine headaches? If Yes, ask: Is this headache similar?      na 8. HEAD INJURY: Has there been any recent injury to your head?      denies 9. OTHER SYMPTOMS: Do you have any other symptoms? (e.g., fever, stiff neck, eye pain, sore throat, cold symptoms)     Blurred vision  Protocols used: Headache-A-AH

## 2023-11-15 ENCOUNTER — Ambulatory Visit: Admitting: Family Medicine

## 2023-11-21 ENCOUNTER — Encounter: Payer: Self-pay | Admitting: Nurse Practitioner

## 2023-11-21 ENCOUNTER — Ambulatory Visit: Attending: Nurse Practitioner | Admitting: Nurse Practitioner

## 2023-11-21 VITALS — BP 145/100 | HR 72 | Temp 98.2°F | Resp 18 | Ht 64.0 in | Wt 140.0 lb

## 2023-11-21 DIAGNOSIS — G894 Chronic pain syndrome: Secondary | ICD-10-CM | POA: Insufficient documentation

## 2023-11-21 DIAGNOSIS — G8929 Other chronic pain: Secondary | ICD-10-CM | POA: Insufficient documentation

## 2023-11-21 DIAGNOSIS — M545 Low back pain, unspecified: Secondary | ICD-10-CM | POA: Diagnosis not present

## 2023-11-21 DIAGNOSIS — G8928 Other chronic postprocedural pain: Secondary | ICD-10-CM | POA: Diagnosis not present

## 2023-11-21 DIAGNOSIS — M961 Postlaminectomy syndrome, not elsewhere classified: Secondary | ICD-10-CM | POA: Diagnosis not present

## 2023-11-21 DIAGNOSIS — Z79899 Other long term (current) drug therapy: Secondary | ICD-10-CM | POA: Insufficient documentation

## 2023-11-21 DIAGNOSIS — M542 Cervicalgia: Secondary | ICD-10-CM | POA: Insufficient documentation

## 2023-11-21 DIAGNOSIS — Z9889 Other specified postprocedural states: Secondary | ICD-10-CM | POA: Insufficient documentation

## 2023-11-21 MED ORDER — MORPHINE SULFATE 15 MG PO TABS: 15.0000 mg | ORAL_TABLET | Freq: Four times a day (QID) | ORAL | 0 refills | Status: DC | PRN

## 2023-11-21 MED ORDER — MORPHINE SULFATE 15 MG PO TABS: 15.0000 mg | ORAL_TABLET | Freq: Four times a day (QID) | ORAL | 0 refills | Status: AC | PRN

## 2023-11-21 NOTE — Progress Notes (Signed)
 Nursing Pain Medication Assessment:  Safety precautions to be maintained throughout the outpatient stay will include: orient to surroundings, keep bed in low position, maintain call bell within reach at all times, provide assistance with transfer out of bed and ambulation.  Medication Inspection Compliance: Pill count conducted under aseptic conditions, in front of the patient. Neither the pills nor the bottle was removed from the patient's sight at any time. Once count was completed pills were immediately returned to the patient in their original bottle.  Medication: Oxycodone  IR Pill/Patch Count: 10 of 120 pills/patches remain Pill/Patch Appearance: Markings consistent with prescribed medication Bottle Appearance: Standard pharmacy container. Clearly labeled. Filled Date: 08 / 13 / 2025 Last Medication intake:  Today

## 2023-11-21 NOTE — Progress Notes (Signed)
 PROVIDER NOTE: Interpretation of information contained herein should be left to medically-trained personnel. Specific patient instructions are provided elsewhere under Patient Instructions section of medical record. This document was created in part using AI and STT-dictation technology, any transcriptional errors that may result from this process are unintentional.  Patient: [name]  Service: E/M   PCP: Myrla Jon HERO, MD  DOB: [birth year]  DOS: 11/21/2023  Provider: Emmy MARLA Blanch, NP  MRN: [id number]  Delivery: Face-to-face  Specialty: Interventional Pain Management  Type: Established Patient  Setting: Ambulatory outpatient facility  Specialty designation: 09  Referring Prov.: Myrla Jon HERO, MD  Location: Outpatient office facility       History of present illness (HPI) [name], a 63 y.o. year old female, is here today because of her Chronic pain syndrome [G89.4]. 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   Pain Assessment: Severity of Chronic pain is reported as a 4 /10. Location: Back Lower/Denies. Onset: More than a month ago. Quality: Aching. Timing: Constant. Modifying factor(s): Heat, medication. Vitals:  height is 5' 4 (1.626 m) and weight is 140 lb (63.5 kg). Her temperature is 98.2 F (36.8 C). Her blood pressure is 145/100 (abnormal) and her pulse is 72. Her  respiration is 18.  BMI: Estimated body mass index is 24.03 kg/m as calculated from the following:   Height as of this encounter: 5' 4 (1.626 m).   Weight as of this encounter: 140 lb (63.5 kg).  Last encounter: 10/18/2023. Last procedure: Visit date not found.  Reason for encounter: medication management.  The patient indicates doing well with current medication regimen. No adverse reaction or side effects reported to medication. We discussed transitioning from Oxycodone  to Morphine  15 mg  every 6 hours. The patient's current pharmacy still having a issues with Morphine . She decide to change her pharmacy from [facility] to [facility].   Pharmacotherapy Assessment   Morphine  (MS IR) 15 mg tablet every 6 hours as needed for pain. MME=90 (Stared on 11/21/2023) Oxycodone  HCl 10 mg tablet every 6 hours as needed for pain. MME=60 (Discontinue on 11/21/2023) Monitoring: [place] PMP: PDMP reviewed during this encounter.       Pharmacotherapy: No side-effects or adverse reactions reported. Compliance: No problems identified. Effectiveness: Clinically acceptable.  Erlene Doyal SAUN[place], [place]  11/21/2023 11:57 AM  Sign when Signing Visit Nursing Pain Medication Assessment:  Safety precautions to be maintained throughout the outpatient stay will include: orient to surroundings, keep bed in low position, maintain call bell within reach at all times, provide assistance with transfer out of bed and ambulation.  Medication Inspection Compliance: Pill count conducted under aseptic conditions, in front of the patient. Neither the pills nor the bottle was removed from the patient's sight at any time. Once count was completed pills were immediately returned to the patient in their original bottle.  Medication: Oxycodone  IR Pill/Patch Count: 10 of 120 pills/patches remain Pill/Patch Appearance: Markings consistent with prescribed medication Bottle Appearance: Standard pharmacy container. Clearly labeled. Filled Date:  08 / 13  / 2025 Last Medication intake:  Today    UDS:  Summary  Date Value Ref Range Status  09/12/2023 FINAL  Final    Comment:    ==================================================================== [employer] Select 13 (MW) ==================================================================== Test                             Result       Flag       Units  Drug Present and Declared for Prescription Verification   Alprazolam                      223          EXPECTED   ng/mg creat   Alpha-hydroxyalprazolam        570          EXPECTED   ng/mg creat    Source of alprazolam  is a scheduled prescription medication. Alpha-    hydroxyalprazolam is an expected metabolite of alprazolam .    Morphine                        >22727       EXPECTED   ng/mg creat   Normorphine                    1882         EXPECTED   ng/mg creat    Potential sources of large amounts of morphine  in the absence of    codeine include administration of morphine  or use of heroin.     Normorphine is an expected metabolite of morphine .    Hydromorphone                   480          EXPECTED   ng/mg creat    Hydromorphone  may be present as a metabolite of morphine ;    concentrations of hydromorphone  rarely exceed 5% of the morphine     concentration when this is the source of hydromorphone .  ==================================================================== Test                      Result    Flag   Units      Ref Range   Creatinine              44               mg/dL      >=79 ==================================================================== Declared Medications:  The flagging and interpretation on this report are based on the  following declared medications.  Unexpected results may arise from  inaccuracies in the declared medications.   **Note: The testing scope of this panel includes these medications:   Alprazolam  (Xanax )  Morphine  (MSIR)   **Note: The testing scope of this panel does not include the   following reported medications:   Acetaminophen  (Tylenol )  Apixaban  (Eliquis )  Cholecalciferol  Dexlansoprazole  (Dexilant )  Dofetilide  (Tikosyn )  Linaclotide  (Linzess )  Mometasone   Montelukast  (Singulair )  Naloxone  (Narcan )  Valacyclovir  (Valtrex ) ==================================================================== For clinical consultation, please call [phone]. ====================================================================     No results found for: CBDTHCR No results found for: D8THCCBX No results found for: D9THCCBX  ROS  Constitutional: Denies any fever or chills Gastrointestinal: No reported hemesis, hematochezia, vomiting, or acute GI distress Musculoskeletal: Denies any acute onset joint swelling, redness, loss of ROM, or weakness Neurological: No reported episodes of acute  onset apraxia, aphasia, dysarthria, agnosia, amnesia, paralysis, loss of coordination, or loss of consciousness  Medication Review  ALPRAZolam , Azelastine HCl, Oxycodone  HCl, acetaminophen , apixaban , cetirizine , cholecalciferol, dexlansoprazole , dofetilide , fluticasone , meclizine , montelukast , morphine , naloxone , nitrofurantoin  (macrocrystal-monohydrate), phenazopyridine , and valACYclovir   History Review  Allergy: [name] is allergic to amoxicillin, chlorhexidine  gluconate, clindamycin, codeine, erythromycin, penicillin g, sulfa antibiotics, shellfish allergy, decadron  [dexamethasone ], flecainide , papaya derivatives, betadine [povidone iodine], other, and prednisone . Drug: [name]  reports current drug use. Alcohol:  reports no history of alcohol use. Tobacco:  reports that she has never smoked. She has never used smokeless tobacco. Social: Ms. Cromie  reports that she has never smoked. She has never used smokeless tobacco. She reports current drug use. She reports that she does not drink alcohol. 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), Anemia, Anxiety, Asthma, Cephalalgia (08/18/2014), Cervical disc disease (08/18/2014), Chronic headaches, Chronic, continuous use of opioids, DDD (degenerative disc disease), Depression, Dissociative disorder, Dizziness (04/22/2013), Duodenal ulcer with hemorrhage and perforation (HCC) (04/27/2003), Foot drop, right, GERD (gastroesophageal reflux disease), H/O arthrodesis (08/18/2014), Headache(784.0), History of blood transfusion, History of cardiac cath, History of cardioversion, History of cervical spinal surgery (01/04/2015), History of kidney stones, Hypertension, Inverted T wave, Iron  deficiency (02/01/2021), Mitral regurgitation, Narrowing of intervertebral disc space (08/18/2014), Orthostatic hypotension (04/22/2013), Pain, Paroxysmal atrial fibrillation (HCC) (2021), Pneumonia, PONV (postoperative nausea and vomiting), Postop Hyponatremia (05/14/2012), Postoperative anemia due to acute blood loss (05/14/2012), PTSD (post-traumatic stress disorder), Right foot drop, Right hip arthralgia (08/18/2014), Sleep apnea (2021), Therapeutic opioid-induced constipation (OIC), and Typical atrial flutter (HCC). Surgical: [name]  has a past surgical history that includes RIGHT HIP ARTHROSCOPY FOR LABRAL TEAR (ABOUT 2010); Anterior fusion cervical spine (MAY 2012); Nasal septum surgery [facility] 2013); Back surgery (2009); Tonsillectomy; Abdominal hysterectomy; DIAGNOSTIC LAPAROSCOPIES - MULTIPLE FOR ENDOMETRIOSIS; Cholecystectomy; Hip arthroscopy (09/20/2011); Shoulder arthroscopy (05-06-12); Carpal tunnel release (05-06-12); Total hip arthroplasty (Right, 05/13/2012); Anterior cervical decomp/discectomy fusion (N/A, 10/30/2012); Posterior cervical fusion/foraminotomy (N/A, 04/16/2013); Breast reduction surgery (Bilateral, 06/2016); Colonoscopy with propofol  (N/A, 01/26/2017); Esophagogastroduodenoscopy (egd) with propofol  (N/A, 01/26/2017);  Esophageal dilation (N/A, 01/26/2017); polypectomy (N/A, 01/26/2017); PH impedance study (N/A, 05/30/2017); Esophageal manometry (N/A, 05/30/2017); Radiofrequency ablation nerves; afib (05/2019); LEFT HEART CATH AND CORONARY ANGIOGRAPHY (Left, 06/10/2019); Cardioversion (N/A, 12/03/2019); Esophagogastroduodenoscopy (egd) with propofol  (N/A, 01/30/2020); Reduction mammaplasty (Bilateral, 05/2016); Breast biopsy (Right, 2008); Breast excisional biopsy (Left, 1998); TEE without cardioversion (N/A, 08/05/2020); ATRIAL FIBRILLATION ABLATION (N/A, 08/06/2020); Cardiac electrophysiology mapping and ablation; Esophagogastroduodenoscopy (egd) with propofol  (N/A, 01/15/2021); Colonoscopy with propofol  (N/A, 01/16/2021); Cardioversion (N/A, 08/01/2021); Cardioversion (N/A, 08/05/2021); Esophagogastroduodenoscopy (egd) with propofol  (N/A, 12/07/2021); Lower Extremity Angiography (Left, 01/06/2022); and ATRIAL FIBRILLATION ABLATION (N/A, 12/04/2022). Family: family history includes Aneurysm in her maternal grandmother and mother; Anxiety disorder in her grandchild; Bipolar disorder in her grandchild and sister; Breast cancer (age of onset: 58) in her paternal grandmother; Depression in her grandchild.  Laboratory Chemistry Profile   Renal Lab Results  Component Value Date   BUN 8 11/14/2023   CREATININE 0.75 11/14/2023   BCR 6 (L) 10/05/2023   GFRAA >60 11/26/2019   GFRNONAA >60 11/14/2023    Hepatic Lab Results  Component Value Date   AST 15 11/14/2023   ALT 9 11/14/2023   ALBUMIN 4.0 11/14/2023   ALKPHOS 113 11/14/2023   LIPASE 26 08/08/2021    Electrolytes Lab Results  Component Value Date   NA 137 11/14/2023   K 3.4 (L)  11/14/2023   CL 102 11/14/2023   CALCIUM  9.2 11/14/2023   MG 2.2 05/18/2023    Bone Lab Results  Component Value Date   VD25OH 8.1 (L) 02/20/2023   25OHVITD1 23 (L) 11/11/2018   25OHVITD2 9.8 11/11/2018   25OHVITD3 13 11/11/2018    Inflammation (CRP: Acute Phase) (ESR: Chronic  Phase) Lab Results  Component Value Date   CRP 12 (H) 11/11/2018   ESRSEDRATE 42 (H) 11/11/2018   LATICACIDVEN 1.0 01/14/2021         Note: Above Lab results reviewed.  Recent Imaging Review  CT HEAD WO CONTRAST CLINICAL DATA:  Neuro deficit, acute, stroke suspected  Headache.  Nausea and blurry vision.  EXAM: CT HEAD WITHOUT CONTRAST  TECHNIQUE: Contiguous axial images were obtained from the base of the skull through the vertex without intravenous contrast.  RADIATION DOSE REDUCTION: This exam was performed according to the departmental dose-optimization program which includes automated exposure control, adjustment of the mA and/or kV according to patient size and/or use of iterative reconstruction technique.  COMPARISON:  Head CT 03/06/2015  FINDINGS: Brain: No intracranial hemorrhage, mass effect, or midline shift. Brain volume is normal for age. No hydrocephalus. The basilar cisterns are patent. No evidence of territorial infarct or acute ischemia. No extra-axial or intracranial fluid collection.  Vascular: No hyperdense vessel or unexpected calcification.  Skull: No fracture or focal lesion.  Sinuses/Orbits: Trace fluid in the sphenoid sinuses, improved from prior exam. Paranasal sinuses are otherwise clear. No mastoid effusion.  Other: None.  IMPRESSION: No acute intracranial abnormality.  Electronically Signed   By: Andrea Gasman M.D.   On: 11/14/2023 15:26 Note: Reviewed        Physical Exam  Vitals: BP (!) 145/100 (BP Location: Right Arm, Patient Position: Sitting) Comment: 158/91 2nd reading  Pulse 72   Temp 98.2 F (36.8 C)   Resp 18   Ht 5' 4 (1.626 m)   Wt 140 lb (63.5 kg)   BMI 24.03 kg/m  BMI: Estimated body mass index is 24.03 kg/m as calculated from the following:   Height as of this encounter: 5' 4 (1.626 m).   Weight as of this encounter: 140 lb (63.5 kg). Ideal: Ideal body weight: 54.7 kg (120 lb 9.5 oz) Adjusted ideal  body weight: 58.2 kg (128 lb 5.7 oz) General appearance: Well nourished, well developed, and well hydrated. In no apparent acute distress Mental status: Alert, oriented x 3 (person, place, & time)       Respiratory: No evidence of acute respiratory distress Eyes: PERLA   Assessment   Diagnosis Status  1. Chronic pain syndrome   2. Chronic low back pain (1ry area of Pain) (Bilateral) (L>R) w/o sciatica   3. Pharmacologic therapy   4. Medication management   5. Failed back syndrome of cervical spine   6. Chronic neck pain with history of cervical spinal surgery    Controlled Controlled Controlled   Updated Problems: No problems updated.  Plan of Care  Problem-specific:  Assessment and Plan  We will continue on current medication regimen.  Prescribing drug monitoring (PDMP) reviewed; findings consistent with the use of prescribed medication and no evidence of narcotic misuse or abuse. Urine drug screening (UDS) up to date. No other new issues or concern reported to this visit.  Schedule follow-up in 90 days for medication management.   [name] has a current medication list which includes the following long-term medication(s): apixaban , dexlansoprazole , dofetilide , montelukast , cetirizine , fluticasone , and azelastine  hcl.  Pharmacotherapy (Medications Ordered): Meds ordered this encounter  Medications   morphine  (MSIR) 15 MG tablet    Sig: Take 1 tablet (15 mg total) by mouth every 6 (six) hours as needed for moderate pain (pain score 4-6) or severe pain (pain score 7-10). Must last 30 days.    Dispense:  120 tablet    Refill:  0    Chronic Pain: STOP Act (Not applicable) Fill 1 day early if closed on refill date. Avoid benzodiazepines within 8 hours of opioids   morphine  (MSIR) 15 MG tablet    Sig: Take 1 tablet (15 mg total) by mouth every 6 (six) hours as needed for moderate pain (pain score 4-6) or severe pain (pain score 7-10). Must last 30 days.    Dispense:   120 tablet    Refill:  0    Chronic Pain: STOP Act (Not applicable) Fill 1 day early if closed on refill date. Avoid benzodiazepines within 8 hours of opioids   morphine  (MSIR) 15 MG tablet    Sig: Take 1 tablet (15 mg total) by mouth every 6 (six) hours as needed for moderate pain (pain score 4-6) or severe pain (pain score 7-10). Must last 30 days.    Dispense:  120 tablet    Refill:  0    Chronic Pain: STOP Act (Not applicable) Fill 1 day early if closed on refill date. Avoid benzodiazepines within 8 hours of opioids   Orders:  No orders of the defined types were placed in this encounter.       Return in about 3 months (around 02/20/2024) for (F2F), (MM), Emmy Blanch NP.    Recent Visits Date Type Provider Dept  10/18/23 Office Visit Stormi Vandevelde K, NP [facility]  09/24/23 Office Visit Samual Beals K, NP [facility]  09/12/23 Office Visit Laronda Lisby K, NP [facility]  Showing recent visits within past 90 days and meeting all other requirements Today's Visits Date Type Provider Dept  11/21/23 Office Visit Tenley Winward K, NP [facility]  Showing today's visits and meeting all other requirements Future Appointments Date Type Provider Dept  12/10/23 Appointment Gail Creekmore K, NP [facility]  Showing future appointments within next 90 days and meeting all other requirements  I discussed the assessment and treatment plan with the patient. The patient was provided an opportunity to ask questions and all were answered. The patient agreed with the plan and demonstrated an understanding of the instructions.  Patient advised to call back or seek an in-person evaluation if the symptoms or condition worsens.  Duration of encounter: 30 minutes.  Total time on encounter, as per AMA guidelines included both the face-to-face and non-face-to-face time personally spent by the physician and/or other qualified health care professional(s) on the  day of the encounter (includes time in activities that require the physician or other qualified health care professional and does not include time in activities normally performed by clinical staff). Physician's time may include the following activities when performed: Preparing to see the patient (e.g., pre-charting review of records, searching for previously ordered imaging, lab work, and nerve conduction tests) Review of prior analgesic pharmacotherapies. Reviewing PMP Interpreting ordered tests (e.g., lab work, imaging, nerve conduction tests) Performing post-procedure evaluations, including interpretation of diagnostic procedures Obtaining and/or reviewing separately obtained history Performing a medically appropriate examination and/or evaluation Counseling and educating the patient/family/caregiver Ordering medications, tests, or procedures Referring and communicating with other health care professionals (when  not separately reported) Documenting clinical information in the electronic or other health record Independently interpreting results (not separately reported) and communicating results to the patient/ family/caregiver Care coordination (not separately reported)  Note by: Dura Mccormack K Cleland Simkins, NP (TTS and AI technology used. I apologize for any typographical errors that were not detected and corrected.) Date: 11/21/2023; Time: 12:21 PM

## 2023-11-22 ENCOUNTER — Other Ambulatory Visit: Payer: Self-pay | Admitting: Cardiology

## 2023-11-22 NOTE — Telephone Encounter (Signed)
 Prescription refill request for Eliquis  received. Indication:afib Last office visit:8/25 Scr:0.75  9/25 Age: 63 Weight:63.5  kg  Prescription refilled

## 2023-11-26 ENCOUNTER — Inpatient Hospital Stay

## 2023-11-26 ENCOUNTER — Telehealth (INDEPENDENT_AMBULATORY_CARE_PROVIDER_SITE_OTHER): Payer: Self-pay | Admitting: Vascular Surgery

## 2023-11-26 ENCOUNTER — Inpatient Hospital Stay: Attending: Internal Medicine

## 2023-11-26 ENCOUNTER — Encounter: Payer: Self-pay | Admitting: Internal Medicine

## 2023-11-26 ENCOUNTER — Inpatient Hospital Stay: Admitting: Internal Medicine

## 2023-11-26 VITALS — BP 134/82 | HR 69 | Temp 97.7°F | Ht 64.0 in | Wt 139.0 lb

## 2023-11-26 VITALS — BP 109/74 | HR 89

## 2023-11-26 DIAGNOSIS — E611 Iron deficiency: Secondary | ICD-10-CM

## 2023-11-26 DIAGNOSIS — D509 Iron deficiency anemia, unspecified: Secondary | ICD-10-CM | POA: Insufficient documentation

## 2023-11-26 LAB — CBC WITH DIFFERENTIAL (CANCER CENTER ONLY)
Abs Immature Granulocytes: 0.02 K/uL (ref 0.00–0.07)
Basophils Absolute: 0.1 K/uL (ref 0.0–0.1)
Basophils Relative: 1 %
Eosinophils Absolute: 0.1 K/uL (ref 0.0–0.5)
Eosinophils Relative: 1 %
HCT: 40.8 % (ref 36.0–46.0)
Hemoglobin: 13.4 g/dL (ref 12.0–15.0)
Immature Granulocytes: 0 %
Lymphocytes Relative: 27 %
Lymphs Abs: 1.5 K/uL (ref 0.7–4.0)
MCH: 29.5 pg (ref 26.0–34.0)
MCHC: 32.8 g/dL (ref 30.0–36.0)
MCV: 89.9 fL (ref 80.0–100.0)
Monocytes Absolute: 0.3 K/uL (ref 0.1–1.0)
Monocytes Relative: 5 %
Neutro Abs: 3.6 K/uL (ref 1.7–7.7)
Neutrophils Relative %: 66 %
Platelet Count: 314 K/uL (ref 150–400)
RBC: 4.54 MIL/uL (ref 3.87–5.11)
RDW: 12.6 % (ref 11.5–15.5)
WBC Count: 5.6 K/uL (ref 4.0–10.5)
nRBC: 0 % (ref 0.0–0.2)

## 2023-11-26 LAB — FERRITIN: Ferritin: 43 ng/mL (ref 11–307)

## 2023-11-26 LAB — BASIC METABOLIC PANEL - CANCER CENTER ONLY
Anion gap: 9 (ref 5–15)
BUN: 7 mg/dL — ABNORMAL LOW (ref 8–23)
CO2: 25 mmol/L (ref 22–32)
Calcium: 9.2 mg/dL (ref 8.9–10.3)
Chloride: 101 mmol/L (ref 98–111)
Creatinine: 0.73 mg/dL (ref 0.44–1.00)
GFR, Estimated: >60 mL/min (ref >60)
Glucose, Bld: 103 mg/dL — ABNORMAL HIGH (ref 70–99)
Potassium: 4.3 mmol/L (ref 3.5–5.1)
Sodium: 135 mmol/L (ref 135–145)

## 2023-11-26 LAB — IRON AND TIBC
Iron: 73 ug/dL (ref 28–170)
Saturation Ratios: 17 % (ref 10.4–31.8)
TIBC: 419 ug/dL (ref 250–450)
UIBC: 346 ug/dL

## 2023-11-26 MED ORDER — IRON SUCROSE 20 MG/ML IV SOLN
200.0000 mg | Freq: Once | INTRAVENOUS | Status: AC
  Administered 2023-11-26: 200 mg via INTRAVENOUS
  Filled 2023-11-26: qty 10

## 2023-11-26 MED ORDER — SODIUM CHLORIDE 0.9% FLUSH
10.0000 mL | Freq: Once | INTRAVENOUS | Status: AC | PRN
  Administered 2023-11-26: 10 mL
  Filled 2023-11-26: qty 10

## 2023-11-26 NOTE — Progress Notes (Signed)
 Patient tolerated Venofer  infusion well. Explained recommendation of 30 min post monitoring. Patient refused to wait post monitoring. Educated on what signs to watch for & to call with any concerns. No questions, discharged. Stable

## 2023-11-26 NOTE — Telephone Encounter (Signed)
 Pt's appointment has been moved up to the first available. Pt acknowledged.

## 2023-11-26 NOTE — Telephone Encounter (Signed)
 Spoken to patient regarding Fallon's comments. Patient verbalized understanding.  Will forward message to front office to move up appointment.

## 2023-11-26 NOTE — Telephone Encounter (Signed)
 Patient left a message on Friday, 11/23/2023. She stated that she groin pain on the left side. A few hours later, it was swollen in that area then later came a sharp pain. However, patient waited a little while then swelling and pain went away. Then a bruise appeared.   The question is the patient wanted to know if she should get an ultrasound sooner instead of waiting until December.  Please advise.

## 2023-11-26 NOTE — Assessment & Plan Note (Addendum)
#   Anemia- iron  deficiency-[NOV 2022] hemoglobin:11 Ferritin-7 .  Poor tolerance to p.o. iron . today hemoglobin is 13- proceed with infusion.   # Etiology: Unclear etiology- ? Interstitial cystitis- Status post EGD/colonoscopy-2022-monitor for now. stable  # IBS-Diarrhea-?  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 rate controlled currently on Tikosyn  [feb 2025].- stable.  # Hypocalcemia-8.7; continue dietary supplementation. Patient poor tolerance to calcium  plus vitamin D . C 2025- LOW_ on vit D supp  # DISPOSITION: # Venofer   # follow up in 6 months- MD: labs- cbc/bmp;iron  studies/ferritin- possible IV venofer -Dr.B

## 2023-11-26 NOTE — Patient Instructions (Signed)

## 2023-11-26 NOTE — Progress Notes (Signed)
 Feeling well with Her IDA. Denies any symptoms of fatigue or dizziness. Was in ER 2 weeks andand iron  was good. Denies pain. Appetite is low~has been for past year. Denies blood in stool.IBS.

## 2023-11-26 NOTE — Telephone Encounter (Signed)
 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

## 2023-11-26 NOTE — Progress Notes (Signed)
 [name]  Patient Care Team: Myrla Jon HERO, MD as PCP - General (Family Medicine) Darliss Rogue, MD as PCP - Cardiology (Cardiology) Cindie Ole DASEN, MD as PCP - Electrophysiology (Cardiology) Deane Rollo RAMAN, NP as Nurse Practitioner (Psychiatry) Jinny Carmine, MD as Consulting Physician (Gastroenterology) Jordis Laneta FALCON, MD as Consulting Physician (General Surgery) Tanya Glisson, MD as Referring Physician (Pain Medicine) Magalene Camellia BIRCH, DMD (Dentistry) Rennie Cindy SAUNDERS, MD as Consulting Physician (Internal Medicine) Sharron Cordella ORN, OD (Optometry) Isaiah Scrivener, MD as Consulting Physician (Pulmonary Disease) Darliss Rogue, MD as Consulting Physician (Cardiology) Cindie Ole DASEN, MD as Consulting Physician (Cardiology) Schnier, Cordella MATSU, MD (Vascular Surgery) Riddle, Suzann, NP as Nurse Practitioner (Clinical Cardiac Electrophysiology)  CHIEF COMPLAINTS/PURPOSE OF CONSULTATION: ANEMIA  HEMATOLOGY HISTORY:  #Iron  deficiency ANEMIA EGD-gastritis/ colonoscopy- lymphoplasmacytic infiltrateNovember 2022 [Dr. Unk; in hospital]; capsule/Bone marrow Biopsy-none; [history of chronic iron  deficiency anemia- s/p IV iron  infusions in the past]; CTA CAP: Negative for any source of bleeding noted.  Atrophic pancreas-?  Etiology;   #Question pancreatic insufficiency-on Creon  [nov 2022]  #A. Fib [s/p cardioversion-Eliquis  Dr.Agbor]; #Chronic back pain/pain management    Latest Reference Range & Units 01/14/21 06:15  Iron  28 - 170 ug/dL 30  UIBC ug/dL 497  TIBC 749 - 549 ug/dL 467 (H)  Saturation Ratios 10.4 - 31.8 % 6 (L)  Ferritin 11 - 307 ng/mL 7 (L)  Folate >5.9 ng/mL 14.9    HISTORY OF PRESENTING ILLNESS: Alone ambulating independently.  [name] 63 y.o.  female iron  deficient anemia unclear etiology- possible Hx of Interstitial cystis; Hx of A-fib on Eliquis   here for follow-up.  Patient complains of  occasional chest pain & SOB, with no other concern.  Patient was recently admitted to hospital started on Tikosyn  for A-fib.  Feeling well with Her IDA. Denies any symptoms of fatigue or dizziness. Was in ER 2 weeks for inner ear infection-   iron  was good. Denies pain. Appetite is low~has been for past year.  Denies blood in stool.IBS  Chronic intermittent blood in urine.  Gaining weight. Patient not on oral iron  because of intolerance.  Chronic constipation/diarrhea-followed by GI.   Review of Systems  Constitutional:  Positive for malaise/fatigue. Negative for chills, diaphoresis, fever and weight loss.  HENT:  Negative for nosebleeds and sore throat.   Eyes:  Negative for double vision.  Respiratory:  Negative for cough, hemoptysis, sputum production, shortness of breath and wheezing.   Cardiovascular:  Negative for chest pain, palpitations, orthopnea and leg swelling.  Gastrointestinal:  Positive for diarrhea and nausea. Negative for abdominal pain, blood in stool, constipation, heartburn, melena and vomiting.  Genitourinary:  Negative for dysuria, frequency and urgency.  Musculoskeletal:  Negative for back pain and joint pain.  Skin: Negative.  Negative for itching and rash.  Neurological:  Negative for dizziness, tingling, focal weakness, weakness and headaches.  Endo/Heme/Allergies:  Does not bruise/bleed easily.  Psychiatric/Behavioral:  Negative for depression. The patient is not nervous/anxious and does not have insomnia.     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    Chronic headaches     Chronic, continuous use of opioids    DDD (degenerative disc disease)    CERVICAL AND LUMBAR-CHRONIC PAIN, RT HIP LABRAL TEAR   Depression    PT STATES A LOT OF STRESS IN HER LIFE   Dissociative disorder    Dizziness 04/22/2013   Duodenal ulcer with hemorrhage and perforation (HCC) 04/27/2003   Foot drop, right    FROM BACK SURGERY   GERD (gastroesophageal reflux disease)    H/O arthrodesis 08/18/2014   Headache(784.0)    AND NECK PAIN--STATES RECENT TEST SHOW CERVICAL DEGENERATION   History of blood transfusion    s/p back surgery   History of cardiac cath    a. 05/2019 Cath: Nl cors. EF 55-65%.   History of cardioversion    History of cervical spinal surgery 01/04/2015   History of kidney stones    Hypertension    Inverted T wave    Iron  deficiency 02/01/2021   Mitral regurgitation    a. 07/2020 Echo: EF 60-65%, no rwma. Nl RV size/fxn. RVSP 39.66mmHg. Mildly to mod dil LA. Mod MR; b. 07/2020 TEE: EF 55-60%. Lambl's excresence. Nl RV size/fxn. Midly dil LA. No LA/LAA thrombus. Mild MR.   Narrowing of intervertebral disc space 08/18/2014   Currently on disability.    Orthostatic hypotension 04/22/2013   Pain    CHRONIC NECK AND BACK PAIN - LIMITED ROM NECK - S/P FUSIONS CERVICAL AND LUMBAR   Paroxysmal atrial fibrillation (HCC) 2021   a. 07/2020 s/p PVI.   Pneumonia    PONV (postoperative nausea and vomiting)    PT GIVES HX OF N&V AND FEVER WITH SURGERIES YEARS AGO--BUT NO PROBLEMS WITH MORE RECENT SURGERIES--STATES NOT MALIGNANT HYPERTHERMIA   Postop Hyponatremia 05/14/2012   Postoperative anemia due to acute blood loss 05/14/2012   PTSD (post-traumatic stress disorder)    Right foot drop    Right hip arthralgia 08/18/2014   Status post surgery of right, and now needs left.    Sleep apnea 2021   does not have a cpap   Therapeutic opioid-induced constipation (OIC)    Typical atrial flutter (HCC)     SURGICAL HISTORY: Past Surgical History:  Procedure Laterality Date    ABDOMINAL HYSTERECTOMY     afib  05/2019   ANTERIOR CERVICAL DECOMP/DISCECTOMY FUSION N/A 10/30/2012   Procedure: ACDF C5-6, EXPLORATION AND HARDWARE REMOVAL C6-7;  Surgeon: Donaciano Sprang, MD;  Location: [facility] OR;  Service: Orthopedics;  Laterality: N/A;   ANTERIOR FUSION CERVICAL SPINE  MAY 2012   AT [facility]   ATRIAL FIBRILLATION ABLATION N/A 08/06/2020   Procedure: ATRIAL FIBRILLATION ABLATION;  Surgeon: Cindie Ole DASEN, MD;  Location: [facility] INVASIVE CV LAB;  Service: Cardiovascular;  Laterality: N/A;   ATRIAL FIBRILLATION ABLATION N/A 12/04/2022   Procedure: ATRIAL FIBRILLATION ABLATION;  Surgeon: Cindie Ole DASEN, MD;  Location: [facility] IN[facility];  Service: Cardiovascular;  Laterality: N/A;   BACK SURGERY  2009   LUMBAR FUSION WITH RODS    BREAST BIOPSY Right 2008   benign.- bx/clip   BREAST EXCISIONAL BIOPSY Left 1998   benign   BREAST REDUCTION SURGERY Bilateral 06/2016   CARDIAC ELECTROPHYSIOLOGY MAPPING AND ABLATION     CARDIOVERSION N/A 12/03/2019   Procedure: CARDIOVERSION;  Surgeon: Darliss Rogue, MD;  Location: [facility] ORS;  Service: Cardiovascular;  Laterality: N/A;   CARDIOVERSION N/A 08/01/2021   Procedure: CARDIOVERSION;  Surgeon: Darliss Rogue, MD;  Location:  [facility] ORS;  Service: Cardiovascular;  Laterality: N/A;   CARDIOVERSION N/A 08/05/2021   Procedure: CARDIOVERSION;  Surgeon: Perla Evalene PARAS, MD;  Location: [facility] ORS;  Service: Cardiovascular;  Laterality: N/A;   CARPAL TUNNEL RELEASE  05-06-12   Right   CHOLECYSTECTOMY     COLONOSCOPY WITH PROPOFOL  N/A 01/26/2017   Procedure: COLONOSCOPY WITH PROPOFOL ;  Surgeon: Jinny Carmine, MD;  Location: [facility] SURGERY CNTR;  Service: Endoscopy;  Laterality: N/A;   COLONOSCOPY WITH PROPOFOL  N/A 01/16/2021   Procedure: COLONOSCOPY WITH PROPOFOL ;  Surgeon: Unk Corinn Skiff, MD;  Location: [facility] ENDOSCOPY;  Service: Gastroenterology;  Laterality: N/A;   DIAGNOSTIC LAPAROSCOPIES - MULTIPLE FOR ENDOMETRIOSIS     ESOPHAGEAL DILATION  N/A 01/26/2017   Procedure: ESOPHAGEAL DILATION;  Surgeon: Jinny Carmine, MD;  Location: [facility] SURGERY CNTR;  Service: Endoscopy;  Laterality: N/A;   ESOPHAGEAL MANOMETRY N/A 05/30/2017   Procedure: ESOPHAGEAL MANOMETRY (EM);  Surgeon: Jinny Carmine, MD;  Location: [facility] ENDOSCOPY;  Service: Endoscopy;  Laterality: N/A;   ESOPHAGOGASTRODUODENOSCOPY (EGD) WITH PROPOFOL  N/A 01/26/2017   Procedure: ESOPHAGOGASTRODUODENOSCOPY (EGD) WITH PROPOFOL ;  Surgeon: Jinny Carmine, MD;  Location: [facility];  Service: Endoscopy;  Laterality: N/A;   ESOPHAGOGASTRODUODENOSCOPY (EGD) WITH PROPOFOL  N/A 01/30/2020   Procedure: ESOPHAGOGASTRODUODENOSCOPY (EGD) WITH PROPOFOL ;  Surgeon: Jinny Carmine, MD;  Location: [facility] SURGERY CNTR;  Service: Endoscopy;  Laterality: N/A;  sleep apnea COVID + 01-15-20   ESOPHAGOGASTRODUODENOSCOPY (EGD) WITH PROPOFOL  N/A 01/15/2021   Procedure: ESOPHAGOGASTRODUODENOSCOPY (EGD) WITH PROPOFOL ;  Surgeon: Unk Corinn Skiff, MD;  Location: [facility] ENDOSCOPY;  Service: Gastroenterology;  Laterality: N/A;   ESOPHAGOGASTRODUODENOSCOPY (EGD) WITH PROPOFOL  N/A 12/07/2021   Procedure: ESOPHAGOGASTRODUODENOSCOPY (EGD) WITH PROPOFOL ;  Surgeon: Unk Corinn Skiff, MD;  Location: [facility] ENDOSCOPY;  Service: Gastroenterology;  Laterality: N/A;   HIP ARTHROSCOPY  09/20/2011   Procedure: ARTHROSCOPY HIP;  Surgeon: Dempsey LULLA Moan, MD;  Location: [facility] ORS;  Service: Orthopedics;  Laterality: Right;  Right Hip Scope with Labral Debridement   LEFT HEART CATH AND CORONARY ANGIOGRAPHY Left 06/10/2019   Procedure: LEFT HEART CATH AND CORONARY ANGIOGRAPHY;  Surgeon: Mady Bruckner, MD;  Location: [facility] INVASIVE CV LAB;  Service: Cardiovascular;  Laterality: Left;   LOWER EXTREMITY ANGIOGRAPHY Left 01/06/2022   Procedure: Lower Extremity Angiography;  Surgeon: Jama Cordella MATSU, MD;  Location: [facility] INVASIVE CV LAB;  Service: Cardiovascular;  Laterality: Left;   NASAL SEPTUM SURGERY  MARCH 2013   IN [place]    [facility] IMPEDANCE STUDY N/A 05/30/2017   Procedure: PH IMPEDANCE STUDY;  Surgeon: Jinny Carmine, MD;  Location: [facility] ENDOSCOPY;  Service: Endoscopy;  Laterality: N/A;   POLYPECTOMY N/A 01/26/2017   Procedure: POLYPECTOMY;  Surgeon: Jinny Carmine, MD;  Location: [facility] SUR[facility]ERY [facility];  Service: Endoscopy;  Laterality: N/A;   POSTERIOR CERVICAL FUSION/FORAMINOTOMY N/A 04/16/2013   Procedure: REMOVAL CERVICAL PLATES AND INTERBODY CAGE/POSTERIOR CERVICAL SPINAL FUSION C4 - C6/C5 CORPECTOMY/C4 - C6 FUSION WITH ILIAC CREST BONE GRAFT;  Surgeon: Donaciano Sprang, MD;  Location: [facility] OR;  Service: Orthopedics;  Laterality: N/A;   RADIOFREQUENCY ABLATION NERVES     REDUCTION MAMMAPLASTY Bilateral 05/2016   RIGHT HIP ARTHROSCOPY FOR LABRAL TEAR  ABOUT 2010   2012 also   SHOULDER ARTHROSCOPY  05-06-12   bone spur   TEE WITHOUT CARDIOVERSION N/A 08/05/2020   Procedure: TRANSESOPHAGEAL ECHOCARDIOGRAM (TEE);  Surgeon: Pietro Redell RAMAN, MD;  Location: [facility] ENDOSCOPY;  Service: Cardiovascular;  Laterality: N/A;   TONSILLECTOMY     AS A CHILD   TOTAL HIP ARTHROPLASTY Right 05/13/2012  Procedure: TOTAL HIP ARTHROPLASTY ANTERIOR APPROACH;  Surgeon: Dempsey LULLA Moan, MD;  Location: [facility] ORS;  Service: Orthopedics;  Laterality: Right;    SOCIAL HISTORY: Social History   Socioeconomic History   Marital status: Married    Spouse name: bruce   Number of children: 2   Years of education: Not on file   Highest education level: Associate degree: academic program  Occupational History    Comment: disabled  Tobacco Use   Smoking status: Never   Smokeless tobacco: Never   Tobacco comments:    Never smoked 01/01/23  Vaping Use   Vaping status: Never Used  Substance and Sexual Activity   Alcohol use: No    Alcohol/week: 0.0 standard drinks of alcohol   Drug use: Yes    Comment: prescribed morphine  and xanax    Sexual activity: Not Currently  Other Topics Concern   Not on file  Social History Narrative   Son, daughter  And  4 grandkids; raises 3 of the grandchildren      Lives in [place]; with husband; never smoked; no alcohol; 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    Social Drivers of Corporate investment banker Strain: Low Risk  (10/17/2023)   Received from [facility]   Overall Financial Resource Strain (CARDIA)    Difficulty of Paying Living Expenses: Not hard at all  Food Insecurity: No Food Insecurity (10/17/2023)   Received from [facility]   Hunger Vital Sign    Within the past 12 months, you worried that your food would run out before you got the money to buy more.: Never true    Within the past 12 months, the food you bought just didn't last and you didn't have money to get more.: Never true  Transportation Needs: No Transportation Needs (10/17/2023)   Received from [facility] - Transportation    In the past 12 months, has lack of transportation kept you from medical appointments or from getting medications?: No    Lack of Transportation (Non-Medical): No  Physical Activity: Inactive (09/24/2023)   Exercise Vital Sign    Days of Exercise per Week: 0 days    Minutes of Exercise per Session: 0 min  Stress: Stress Concern Present (09/24/2023)   [employer] of Occupational Health - Occupational Stress Questionnaire    Feeling of Stress: To some extent  Social Connections: Socially Integrated (09/24/2023)   Social Connection and Isolation Panel    Frequency of Communication with Friends and Family: More than three times a week    Frequency of Social Gatherings with Friends and Family: More than three times a week    Attends Religious Services: 1 to 4 times per year    Active Member of [employer] or Organizations: Yes    Attends Engineer, structural: More than 4 times per year    Marital Status: Married  Catering manager Violence: Not At Risk (09/24/2023)   Humiliation, Afraid, Rape, and Kick questionnaire    Fear of Current or Ex-Partner: No     Emotionally Abused: No    Physically Abused: No    Sexually Abused: No    FAMILY HISTORY: Family History  Problem Relation Age of Onset   Aneurysm Mother    Bipolar disorder Sister    Aneurysm Maternal Grandmother    Breast cancer Paternal Grandmother 19   Bipolar disorder Grandchild    Anxiety disorder Grandchild    Depression Grandchild    Cancer  Neg Hx     ALLERGIES:  is allergic to amoxicillin, chlorhexidine  gluconate, clindamycin, codeine, erythromycin, penicillin g, sulfa antibiotics, shellfish allergy, decadron  [dexamethasone ], flecainide , papaya derivatives, betadine [povidone iodine], other, and prednisone .  MEDICATIONS:  Current Outpatient Medications  Medication Sig Dispense Refill   acetaminophen  (TYLENOL ) 325 MG tablet Take 2 tablets (650 mg total) by mouth every 6 (six) hours as needed for mild pain (or Fever >/= 101).     ALPRAZolam  (XANAX ) 1 MG tablet Take 1 mg by mouth at bedtime.      cholecalciferol (VITAMIN D3) 25 MCG (1000 UNIT) tablet Take 1,000 Units by mouth daily.     dexlansoprazole  (DEXILANT ) 60 MG capsule TAKE 1 CAPSULE BY MOUTH EVERY DAY 90 capsule 1   dofetilide  (TIKOSYN ) 500 MCG capsule Take 1 capsule (500 mcg total) by mouth 2 (two) times daily. 180 capsule 3   ELIQUIS  5 MG TABS tablet TAKE 1 TABLET BY MOUTH TWICE A DAY 60 tablet 5   linaclotide  (LINZESS ) 290 MCG CAPS capsule Take 290 mcg by mouth daily before breakfast.     montelukast  (SINGULAIR ) 10 MG tablet TAKE 1 TABLET BY MOUTH EVERY DAY 90 tablet 1   morphine  (MSIR) 15 MG tablet Take 1 tablet (15 mg total) by mouth every 6 (six) hours as needed for moderate pain (pain score 4-6) or severe pain (pain score 7-10). Must last 30 days. 120 tablet 0   [START ON 12/23/2023] morphine  (MSIR) 15 MG tablet Take 1 tablet (15 mg total) by mouth every 6 (six) hours as needed for moderate pain (pain score 4-6) or severe pain (pain score 7-10). Must last 30 days. 120 tablet 0   [START ON 01/22/2024]  morphine  (MSIR) 15 MG tablet Take 1 tablet (15 mg total) by mouth every 6 (six) hours as needed for moderate pain (pain score 4-6) or severe pain (pain score 7-10). Must last 30 days. 120 tablet 0   naloxone  (NARCAN ) nasal spray 4 mg/0.1 mL Place 1 spray into the nose as needed for up to 365 doses (for opioid-induced respiratory depresssion). In case of emergency (overdose), spray once into each nostril. If no response within 3 minutes, repeat application and call 911. 1 each 0   valACYclovir  (VALTREX ) 1000 MG tablet TAKE TWO TABLETS BY MOUTH TWICE DAILY FOR ONE DAY FEVER BLISTERTAKE TWO TABLETS BY MOUTH TWICE DAILY FOR ONE DAY FEVER BLISTER 4 tablet 5   No current facility-administered medications for this visit.      PHYSICAL EXAMINATION:   Vitals:   11/26/23 1313  BP: 134/82  Pulse: 69  Temp: 97.7 F (36.5 C)  SpO2: 97%     Filed Weights   11/26/23 1313  Weight: 139 lb (63 kg)    Irregular rhythm.  Physical Exam Vitals and nursing note reviewed.  HENT:     Head: Normocephalic and atraumatic.     Mouth/Throat:     Pharynx: Oropharynx is clear.  Eyes:     Extraocular Movements: Extraocular movements intact.     Pupils: Pupils are equal, round, and reactive to light.  Cardiovascular:     Rate and Rhythm: Normal rate.  Pulmonary:     Comments: Decreased breath sounds bilaterally.  Abdominal:     Palpations: Abdomen is soft.  Musculoskeletal:        General: Normal range of motion.     Cervical back: Normal range of motion.  Skin:    General: Skin is warm.  Neurological:     General: No focal  deficit present.     Mental Status: She is alert and oriented to person, place, and time.  Psychiatric:        Behavior: Behavior normal.        Judgment: Judgment normal.     LABORATORY DATA:  I have reviewed the data as listed Lab Results  Component Value Date   WBC 5.6 11/26/2023   HGB 13.4 11/26/2023   HCT 40.8 11/26/2023   MCV 89.9 11/26/2023   PLT 314  11/26/2023   Recent Labs    02/20/23 1444 04/30/23 1358 05/25/23 1324 10/05/23 1616 11/14/23 1359 11/26/23 1256  NA 138   < > 135 137 137 135  K 4.2   < > 4.0 3.9 3.4* 4.3  CL 101   < > 105 99 102 101  CO2 24   < > 22 22 24 25   GLUCOSE 97   < > 120* 117* 133* 103*  BUN 6*   < > 8 5* 8 7*  CREATININE 0.71   < > 0.70 0.83 0.75 0.73  CALCIUM  9.2   < > 8.7* 9.3 9.2 9.2  GFRNONAA  --    < > >60  --  >60 >60  PROT 6.5  --   --   --  7.4  --   ALBUMIN 4.0  --   --   --  4.0  --   AST 17  --   --   --  15  --   ALT 9  --   --   --  9  --   ALKPHOS 146*  145*  --   --   --  113  --   BILITOT 0.4  --   --   --  0.9  --    < > = values in this interval not displayed.     CT HEAD WO CONTRAST Result Date: 11/14/2023 CLINICAL DATA:  Neuro deficit, acute, stroke suspected Headache.  Nausea and blurry vision. EXAM: CT HEAD WITHOUT CONTRAST TECHNIQUE: Contiguous axial images were obtained from the base of the skull through the vertex without intravenous contrast. RADIATION DOSE REDUCTION: This exam was performed according to the departmental dose-optimization program which includes automated exposure control, adjustment of the mA and/or kV according to patient size and/or use of iterative reconstruction technique. COMPARISON:  Head CT 03/06/2015 FINDINGS: Brain: No intracranial hemorrhage, mass effect, or midline shift. Brain volume is normal for age. No hydrocephalus. The basilar cisterns are patent. No evidence of territorial infarct or acute ischemia. No extra-axial or intracranial fluid collection. Vascular: No hyperdense vessel or unexpected calcification. Skull: No fracture or focal lesion. Sinuses/Orbits: Trace fluid in the sphenoid sinuses, improved from prior exam. Paranasal sinuses are otherwise clear. No mastoid effusion. Other: None. IMPRESSION: No acute intracranial abnormality. Electronically Signed   By: Andrea Gasman M.D.   On: 11/14/2023 15:26    Iron  deficiency # Anemia- iron   deficiency-[NOV 2022] hemoglobin:11 Ferritin-7 .  Poor tolerance to p.o. iron . today hemoglobin is 13- proceed with infusion.   # Etiology: Unclear etiology- ? Interstitial cystitis- Status post EGD/colonoscopy-2022-monitor for now. stable  # IBS-Diarrhea-?  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 rate controlled currently on Tikosyn  [feb 2025].- stable.  # Hypocalcemia-8.7; continue dietary supplementation. Patient poor tolerance to calcium  plus vitamin D . C 2025- LOW_ on vit D supp  # DISPOSITION: # Venofer   # follow up in  6 months- MD: labs- cbc/bmp;iron  studies/ferritin- possible IV venofer -Dr.B  All questions were answered. The patient knows to call the clinic with any problems, questions or concerns.    Cindy JONELLE Joe, MD 11/26/2023 2:08 PM

## 2023-11-29 ENCOUNTER — Ambulatory Visit: Admitting: Family Medicine

## 2023-12-03 ENCOUNTER — Ambulatory Visit (INDEPENDENT_AMBULATORY_CARE_PROVIDER_SITE_OTHER): Admitting: Family Medicine

## 2023-12-03 ENCOUNTER — Encounter: Payer: Self-pay | Admitting: Family Medicine

## 2023-12-03 VITALS — BP 112/81 | HR 73 | Ht 64.0 in | Wt 137.8 lb

## 2023-12-03 DIAGNOSIS — M791 Myalgia, unspecified site: Secondary | ICD-10-CM | POA: Diagnosis not present

## 2023-12-03 DIAGNOSIS — E559 Vitamin D deficiency, unspecified: Secondary | ICD-10-CM | POA: Diagnosis not present

## 2023-12-03 DIAGNOSIS — K5909 Other constipation: Secondary | ICD-10-CM

## 2023-12-03 DIAGNOSIS — I4819 Other persistent atrial fibrillation: Secondary | ICD-10-CM

## 2023-12-03 DIAGNOSIS — R252 Cramp and spasm: Secondary | ICD-10-CM

## 2023-12-03 MED ORDER — LINACLOTIDE 290 MCG PO CAPS: 290.0000 ug | ORAL_CAPSULE | Freq: Every day | ORAL | 5 refills | Status: AC

## 2023-12-03 NOTE — Progress Notes (Signed)
 Acute visit   Patient: [name]   DOB: [birth year]   63 y.o. Female  MRN: [id number] PCP: Myrla Jon HERO, MD   Chief Complaint  Patient presents with   Acute Visit    Patient is present due to cramps in legs and feet X 2 - 3 weeks ago. She reports left leg is currently worse than right and she has a lot of charlie horses. Reports hx of low potassium. She also reports low back across the lower back and that her entire body just feels like it is bruised. Wondering if things could also be due to arthritis    Subjective    Discussed the use of AI scribe software for clinical note transcription with the patient, who gave verbal consent to proceed.  History of Present Illness   Shelby GELBER is a 63 year old female who presents with leg cramps and body aches.  She experiences frequent cramps in her legs and feet, particularly in the left leg, for the past two to three weeks. Her potassium level is 4.3, within normal limits. She maintains adequate hydration and has not started any new medications recently.  She also has low back pain and a sensation of her body feeling bruised, similar to flu-like aches, for the same duration. There is no fever or flu symptoms. She has degenerative disc disease and spondylosis.  Her current medication is Tikosyn , which is effective for her heart but has interaction issues. She has previously taken Cymbalta  and amitriptyline. She takes vitamin D3 for low levels.  She has a decreased appetite, eating once a day, and a sensation of something stuck in her esophagus. A barium swallow test showed delayed passage of a pill but no significant findings.        Review of Systems  Objective    BP 112/81 (BP Location: Left Arm, Patient Position: Sitting, Cuff Size: Normal)   Pulse 73   Ht 5' 4 (1.626 m)   Wt 137 lb 12.8 oz (62.5 kg)   SpO2 100%   BMI 23.65 kg/m  Physical Exam Vitals reviewed.  Constitutional:      General: She is not in  acute distress.    Appearance: Normal appearance. She is well-developed. She is not diaphoretic.  HENT:     Head: Normocephalic and atraumatic.  Eyes:     General: No scleral icterus.    Conjunctiva/sclera: Conjunctivae normal.  Neck:     Thyroid : No thyromegaly.  Cardiovascular:     Rate and Rhythm: Normal rate and regular rhythm.     Heart sounds: Normal heart sounds. No murmur heard. Pulmonary:     Effort: Pulmonary effort is normal. No respiratory distress.     Breath sounds: Normal breath sounds. No wheezing, rhonchi or rales.  Musculoskeletal:     Cervical back: Neck supple.     Right lower leg: No edema.     Left lower leg: No edema.  Lymphadenopathy:     Cervical: No cervical adenopathy.  Skin:    General: Skin is warm and dry.     Findings: No rash.  Neurological:     Mental Status: She is alert and oriented to person, place, and time. Mental status is at baseline.  Psychiatric:        Mood and Affect: Mood normal.        Behavior: Behavior normal.       No results found for any visits on 12/03/23.  Assessment &  Plan     Problem List Items Addressed This Visit       Cardiovascular and Mediastinum   Atrial fibrillation (HCC)     Digestive   Chronic constipation (Chronic)     Other   Vitamin D  deficiency   Relevant Orders   VITAMIN D  25 Hydroxy (Vit-D Deficiency, Fractures)   Other Visit Diagnoses       Leg cramps    -  Primary   Relevant Orders   Magnesium    Basic Metabolic Panel (BMET)     Myalgia              Leg and foot cramps Cramps in legs and feet for 2-3 weeks, left leg worse than right. Normal potassium levels, possible magnesium  deficiency. Tikosyn  may contribute to symptoms. - Order serum magnesium  and potassium levels - Recommend magnesium  glycinate 400 mg at bedtime - Advise stretching exercises before bed  Generalized body pain and stiffness Generalized body pain and stiffness, described as flu-like symptoms. Consideration of  fibromyalgia due to pain in areas not typically associated with arthritis. Tikosyn  may contribute to symptoms. - Discuss potential fibromyalgia diagnosis - Encourage regular exercise and yoga for symptom management  Chronic low back pain with degenerative disc disease and spondylosis Chronic low back pain with degenerative disc disease and spondylosis. Symptoms worsening over time. Possible contribution from arthritis and muscle stiffness. - Continue current management for degenerative disc disease - Encourage gentle stretching and yoga  Atrial fibrillation on dofetilide  (Tikosyn ) Atrial fibrillation managed with Tikosyn . Tikosyn  effective for heart condition but may contribute to muscle aches and flu-like symptoms. - Continue Tikosyn  therapy - Review potential alternative medications if symptoms persist  Constipation Chronic constipation managed with Linzess . Previous GI consultation unsatisfactory due to medication interactions with Tikosyn . Linzess  preferred due to previous adverse reactions to other medications. - Prescribe Linzess  290 mcg  Vitamin D  deficiency Vitamin D  deficiency, previously low levels. Current supplementation with vitamin D3. - Order serum vitamin D  level - Continue vitamin D3 supplementation - Encourage safe sun exposure to improve vitamin D  levels      Meds ordered this encounter  Medications   linaclotide  (LINZESS ) 290 MCG CAPS capsule    Sig: Take 1 capsule (290 mcg total) by mouth daily before breakfast.    Dispense:  30 capsule    Refill:  5     Return in about 3 months (around 03/03/2024) for as scheduled.      Jon Eva, MD  [facility] [phone] (phone) [phone] (fax)  [facility]

## 2023-12-04 ENCOUNTER — Ambulatory Visit: Payer: Self-pay | Admitting: Family Medicine

## 2023-12-04 LAB — VITAMIN D 25 HYDROXY (VIT D DEFICIENCY, FRACTURES): Vit D, 25-Hydroxy: 24.9 ng/mL — ABNORMAL LOW (ref 30.0–100.0)

## 2023-12-04 LAB — BASIC METABOLIC PANEL WITH GFR
BUN/Creatinine Ratio: 10 — ABNORMAL LOW (ref 12–28)
BUN: 7 mg/dL — ABNORMAL LOW (ref 8–27)
CO2: 22 mmol/L (ref 20–29)
Calcium: 9.6 mg/dL (ref 8.7–10.3)
Chloride: 101 mmol/L (ref 96–106)
Creatinine, Ser: 0.73 mg/dL (ref 0.57–1.00)
Glucose: 89 mg/dL (ref 70–99)
Potassium: 4.8 mmol/L (ref 3.5–5.2)
Sodium: 138 mmol/L (ref 134–144)
eGFR: 92 mL/min/1.73 (ref >59)

## 2023-12-04 LAB — MAGNESIUM: Magnesium: 2.1 mg/dL (ref 1.6–2.3)

## 2023-12-10 ENCOUNTER — Encounter: Admitting: Nurse Practitioner

## 2023-12-17 ENCOUNTER — Ambulatory Visit (INDEPENDENT_AMBULATORY_CARE_PROVIDER_SITE_OTHER): Admitting: Vascular Surgery

## 2023-12-17 ENCOUNTER — Ambulatory Visit (INDEPENDENT_AMBULATORY_CARE_PROVIDER_SITE_OTHER)

## 2023-12-17 ENCOUNTER — Encounter (INDEPENDENT_AMBULATORY_CARE_PROVIDER_SITE_OTHER): Payer: Self-pay | Admitting: Vascular Surgery

## 2023-12-17 VITALS — BP 137/95 | HR 62 | Resp 18 | Ht 64.0 in | Wt 139.0 lb

## 2023-12-17 DIAGNOSIS — I724 Aneurysm of artery of lower extremity: Secondary | ICD-10-CM | POA: Diagnosis not present

## 2023-12-17 DIAGNOSIS — J4520 Mild intermittent asthma, uncomplicated: Secondary | ICD-10-CM

## 2023-12-17 DIAGNOSIS — I4819 Other persistent atrial fibrillation: Secondary | ICD-10-CM

## 2023-12-17 DIAGNOSIS — I10 Essential (primary) hypertension: Secondary | ICD-10-CM | POA: Diagnosis not present

## 2023-12-17 NOTE — Progress Notes (Unsigned)
 MRN : [id number]  [name] is a 63 y.o. (1960/09/30) female who presents with chief complaint of check carotid arteries.  History of Present Illness:   The patient returns to the office for followup regarding atherosclerotic changes of the lower extremities and review of the noninvasive studies.    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    There have been no interval changes in lower extremity symptoms. No interval shortening of the patient's claudication distance or development of rest pain symptoms. No new ulcers or wounds have occurred since the last visit.   There have been no significant changes to the patient's overall health care.   The patient denies amaurosis fugax or recent TIA symptoms. There are no documented recent neurological changes noted. There is no history of DVT, PE or superficial thrombophlebitis. The patient denies recent episodes of angina or shortness of breath.    Duplex ultrasound of the left lower extremity demonstrates uniform velocities throughout the arterial system.  No evidence of stricture or stenosis of the SFA or common femoral.  Current Meds  Medication Sig   acetaminophen  (TYLENOL ) 325 MG tablet Take 2 tablets (650 mg total) by mouth every 6 (six) hours as needed for mild pain (or Fever >/= 101).   ALPRAZolam  (XANAX ) 1 MG tablet Take 1 mg by mouth at bedtime.    cholecalciferol (VITAMIN D3) 25 MCG (1000 UNIT) tablet Take 1,000 Units by mouth daily.   dexlansoprazole  (DEXILANT ) 60 MG capsule TAKE 1 CAPSULE BY MOUTH EVERY DAY   dofetilide  (TIKOSYN ) 500 MCG capsule Take 1 capsule (500 mcg total) by mouth 2 (two) times daily.   ELIQUIS  5 MG TABS tablet TAKE 1 TABLET BY MOUTH TWICE A DAY   linaclotide  (LINZESS ) 290 MCG CAPS capsule Take 1 capsule (290 mcg total) by mouth daily before breakfast.   montelukast  (SINGULAIR ) 10 MG tablet TAKE 1 TABLET BY MOUTH EVERY  DAY   morphine  (MSIR) 15 MG tablet Take 1 tablet (15 mg total) by mouth every 6 (six) hours as needed for moderate pain (pain score 4-6) or severe pain (pain score 7-10). Must last 30 days.   [START ON 12/23/2023] morphine  (MSIR) 15 MG tablet Take 1 tablet (15 mg total) by mouth every 6 (six) hours as needed for moderate pain (pain score 4-6) or severe pain (pain score 7-10). Must last 30 days.   [START ON 01/22/2024] morphine  (MSIR) 15 MG tablet Take 1 tablet (15 mg total) by mouth every 6 (six) hours as needed for moderate pain (pain score 4-6) or severe pain (pain score 7-10). Must last 30 days.   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    Chronic headaches    Chronic, continuous use of opioids    DDD (degenerative disc disease)    CERVICAL AND LUMBAR-CHRONIC PAIN, RT HIP LABRAL TEAR   Depression    PT STATES A LOT OF STRESS IN HER LIFE   Dissociative disorder    Dizziness 04/22/2013   Duodenal ulcer with hemorrhage and perforation (HCC) 04/27/2003   Foot drop, right    FROM BACK SURGERY   GERD (gastroesophageal reflux disease)    H/O arthrodesis 08/18/2014   Headache(784.0)    AND NECK PAIN--STATES RECENT TEST SHOW CERVICAL DEGENERATION   History of blood transfusion    s/p back surgery   History of cardiac cath    a. 05/2019 Cath: Nl cors. EF 55-65%.   History of cardioversion    History of cervical spinal  surgery 01/04/2015   History of kidney stones    Hypertension    Inverted T wave    Iron  deficiency 02/01/2021   Mitral regurgitation    a. 07/2020 Echo: EF 60-65%, no rwma. Nl RV size/fxn. RVSP 39.77mmHg. Mildly to mod dil LA. Mod MR; b. 07/2020 TEE: EF 55-60%. Lambl's excresence. Nl RV size/fxn. Midly dil LA. No LA/LAA thrombus. Mild MR.   Myocardial infarction [employer]) 2021   AFib/aflutter   Narrowing of intervertebral disc space 08/18/2014   Currently on disability.    Neuromuscular disorder (HCC)    Orthostatic hypotension 04/22/2013   Pain    CHRONIC NECK AND BACK PAIN - LIMITED ROM NECK - S/P FUSIONS CERVICAL AND LUMBAR   Paroxysmal atrial fibrillation (HCC) 2021   a. 07/2020 s/p PVI.   Pneumonia    PONV (postoperative nausea and vomiting)    PT GIVES HX OF N&V AND FEVER WITH SURGERIES YEARS AGO--BUT NO PROBLEMS WITH MORE RECENT SURGERIES--STATES NOT MALIGNANT HYPERTHERMIA   Postop Hyponatremia 05/14/2012   Postoperative anemia due to acute blood loss 05/14/2012   PTSD (post-traumatic stress disorder)    Right foot drop    Right hip arthralgia 08/18/2014   Status post surgery of right, and now needs left.    Sleep apnea 2021   does not have a cpap   Therapeutic opioid-induced constipation (OIC)    Typical atrial flutter [facility])     Past Surgical History:  Procedure Laterality Date   ABDOMINAL HYSTERECTOMY     afib  05/2019   ANTERIOR CERVICAL DECOMP/DISCECTOMY FUSION N/A 10/30/2012   Procedure: ACDF C5-6, EXPLORATION AND HARDWARE REMOVAL C6-7;  Surgeon: Donaciano Sprang, MD;  Location: [facility] OR;  Service: Orthopedics;  Laterality: N/A;   ANTERIOR FUSION CERVICAL SPINE  07/2010   AT [facility]   ATRIAL FIBRILLATION ABLATION N/A 08/06/2020   Procedure: ATRIAL FIBRILLATION ABLATION;  Surgeon: Cindie Ole DASEN, MD;  Location: [facility] INVASIVE CV LAB;  Service: Cardiovascular;  Laterality: N/A;   ATRIAL FIBRILLATION ABLATION N/A 12/04/2022   Procedure: ATRIAL FIBRILLATION ABLATION;  Surgeon:  Cindie Ole DASEN, MD;  Location: [facility];  Service: Cardiovascular;  Laterality: N/A;   BACK SURGERY  2009   LUMBAR FUSION WITH RODS    BREAST BIOPSY Right 2008   benign.- bx/clip   BREAST EXCISIONAL BIOPSY Left 1998   benign   BREAST REDUCTION SURGERY Bilateral 06/2016   CARDIAC ELECTROPHYSIOLOGY MAPPING AND ABLATION     CARDIOVERSION N/A 12/03/2019   Procedure: CARDIOVERSION;  Surgeon: Darliss Rogue, MD;  Location: [facility] ORS;  Service: Cardiovascular;  Laterality: N/A;   CARDIOVERSION N/A 08/01/2021  Procedure: CARDIOVERSION;  Surgeon: Darliss Rogue, MD;  Location: [facility] ORS;  Service: Cardiovascular;  Laterality: N/A;   CARDIOVERSION N/A 08/05/2021   Procedure: CARDIOVERSION;  Surgeon: Perla Evalene PARAS, MD;  Location: [facility] ORS;  Service: Cardiovascular;  Laterality: N/A;   CARPAL TUNNEL RELEASE  05/06/2012   Right   CHOLECYSTECTOMY     COLONOSCOPY WITH PROPOFOL  N/A 01/26/2017   Procedure: COLONOSCOPY WITH PROPOFOL ;  Surgeon: Jinny Carmine, MD;  Location: [facility];  Service: Endoscopy;  Laterality: N/A;   COLONOSCOPY WITH PROPOFOL  N/A 01/16/2021   Procedure: COLONOSCOPY WITH PROPOFOL ;  Surgeon: Unk Corinn Skiff, MD;  Location: [facility] ENDOSCOPY;  Service: Gastroenterology;  Laterality: N/A;   DIAGNOSTIC LAPAROSCOPIES - MULTIPLE FOR ENDOMETRIOSIS     ESOPHAGEAL DILATION N/A 01/26/2017   Procedure: ESOPHAGEAL DILATION;  Surgeon: Jinny Carmine, MD;  Location: [facility]ERY [facility];  Service: Endoscopy;  Laterality: N/A;   ESOPHAGEAL MANOMETRY N/A 05/30/2017   Procedure: ESOPHAGEAL MANOMETRY (EM);  Surgeon: Jinny Carmine, MD;  Location: [facility] ENDOSCOPY;  Service: Endoscopy;  Laterality: N/A;   ESOPHAGOGASTRODUODENOSCOPY (EGD) WITH PROPOFOL  N/A 01/26/2017   Procedure: ESOPHAGOGASTRODUODENOSCOPY (EGD) WITH PROPOFOL ;  Surgeon: Jinny Carmine, MD;  Location: [facility];  Service: Endoscopy;  Laterality: N/A;   ESOPHAGOGASTRODUODENOSCOPY (EGD) WITH PROPOFOL   N/A 01/30/2020   Procedure: ESOPHAGOGASTRODUODENOSCOPY (EGD) WITH PROPOFOL ;  Surgeon: Jinny Carmine, MD;  Location: [facility];  Service: Endoscopy;  Laterality: N/A;  sleep apnea COVID + 01-15-20   ESOPHAGOGASTRODUODENOSCOPY (EGD) WITH PROPOFOL  N/A 01/15/2021   Procedure: ESOPHAGOGASTRODUODENOSCOPY (EGD) WITH PROPOFOL ;  Surgeon: Unk Corinn Skiff, MD;  Location: [facility] ENDOSCOPY;  Service: Gastroenterology;  Laterality: N/A;   ESOPHAGOGASTRODUODENOSCOPY (EGD) WITH PROPOFOL  N/A 12/07/2021   Procedure: ESOPHAGOGASTRODUODENOSCOPY (EGD) WITH PROPOFOL ;  Surgeon: Unk Corinn Skiff, MD;  Location: [facility] ENDOSCOPY;  Service: Gastroenterology;  Laterality: N/A;   EYE SURGERY  20 years ago   lasix    HERNIA REPAIR  5 years ago   HIP ARTHROSCOPY  09/20/2011   Procedure: ARTHROSCOPY HIP;  Surgeon: Dempsey LULLA Moan, MD;  Location: [facility] ORS;  Service: Orthopedics;  Laterality: Right;  Right Hip Scope with Labral Debridement   JOINT REPLACEMENT  8 years ago   right hip   LEFT HEART CATH AND CORONARY ANGIOGRAPHY Left 06/10/2019   Procedure: LEFT HEART CATH AND CORONARY ANGIOGRAPHY;  Surgeon: Mady Bruckner, MD;  Location: [facility] INV[facility] CV LAB;  Service: Cardiovascular;  Laterality: Left;   LOWER EXTREMITY ANGIOGRAPHY Left 01/06/2022   Procedure: Lower Extremity Angiography;  Surgeon: Jama Cordella MATSU, MD;  Location: [facility] INVAS[facility] CV LAB;  Service: Cardiovascular;  Laterality: Left;   NASAL SEPTUM SURGERY  05/2011   IN [place]   [facility] IMPEDANCE STUDY N/A 05/30/2017   Procedure: PH IMPEDANCE STUDY;  Surgeon: Jinny Carmine, MD;  Location: [facility] ENDOSCOPY;  Service: Endoscopy;  Laterality: N/A;   POLYPECTOMY N/A 01/26/2017   Procedure: POLYPECTOMY;  Surgeon: Jinny Carmine, MD;  Location: [facility];  Service: Endoscopy;  Laterality: N/A;   POSTERIOR CERVICAL FUSION/FORAMINOTOMY N/A 04/16/2013   Procedure: REMOVAL CERVICAL PLATES AND INTERBODY CAGE/POSTERIOR CERVICAL SPINAL FUSION C4 - C6/C5  CORPECTOMY/C4 - C6 FUSION WITH ILIAC CREST BONE GRAFT;  Surgeon: Donaciano Sprang, MD;  Location: [facility] OR;  Service: Orthopedics;  Laterality: N/A;   RADIOFREQUENCY ABLATION NERVES     REDUCTION MAMMAPLASTY Bilateral 05/2016   RIGHT HIP ARTHROSCOPY FOR LABRAL TEAR  ABOUT 2010   2012 also   SHOULDER ARTHROSCOPY  05/06/2012   bone spur   SPINE SURGERY  15 years ago  metal rod and cages   TEE WITHOUT CARDIOVERSION N/A 08/05/2020   Procedure: TRANSESOPHAGEAL ECHOCARDIOGRAM (TEE);  Surgeon: Pietro Redell RAMAN, MD;  Location: [facility] ENDOSCOPY;  Service: Cardiovascular;  Laterality: N/A;   TONSILLECTOMY     AS A CHILD   TOTAL HIP ARTHROPLASTY Right 05/13/2012   Procedure: TOTAL HIP ARTHROPLASTY ANTERIOR APPROACH;  Surgeon: Dempsey LULLA Moan, MD;  Location: [facility];  Service: Orthopedics;  Laterality: Right;    Social History Social History   Tobacco Use   Smoking status: Never   Smokeless tobacco: Never   Tobacco comments:    Never smoked 01/01/23  Vaping Use   Vaping status: Never Used  Substance Use Topics   Alcohol use: No    Alcohol/week: 0.0 standard drinks of alcohol   Drug use: Yes    Comment: prescribed morphine  and xanax     Family History Family History  Problem Relation Age of Onset   Aneurysm Mother    Bipolar disorder Sister    Aneurysm Maternal Grandmother    Breast cancer Paternal Grandmother 50   Bipolar disorder Grandchild    Anxiety disorder Grandchild    Depression Grandchild    Cancer Neg Hx     Allergies  Allergen Reactions   Amoxicillin Hives    She did ok w ANCEF    Chlorhexidine  Gluconate Dermatitis and Hives   Clindamycin Hives   Codeine Hives   Erythromycin Hives    mycins in general   Penicillin G Hives    cillins in general   Sulfa Antibiotics Nausea And Vomiting and Hives        Shellfish Allergy Hives   Decadron  [Dexamethasone ] Other (See Comments)    Hot flashes, insomnia, manic    Flecainide  Nausea And Vomiting    Pts said she signs of  going into heart failure. Edema   Papaya Derivatives Hives   Betadine [Povidone Iodine] Hives        Other Hives     Mango    Prednisone  Anxiety    High blood pressure, flushed, mood changes, heart palpitations      REVIEW OF SYSTEMS (Negative unless checked)  Constitutional: [] Weight loss  [] Fever  [] Chills Cardiac: [] Chest pain   [] Chest pressure   [] Palpitations   [] Shortness of breath when laying flat   [] Shortness of breath with exertion. Vascular:  [x] Pain in legs with walking   [] Pain in legs at rest  [] History of DVT   [] Phlebitis   [] Swelling in legs   [] Varicose veins   [] Non-healing ulcers Pulmonary:   [] Uses home oxygen   [] Productive cough   [] Hemoptysis   [] Wheeze  [] COPD   [] Asthma Neurologic:  [] Dizziness   [] Seizures   [] History of stroke   [] History of TIA  [] Aphasia   [] Vissual changes   [] Weakness or numbness in arm   [] Weakness or numbness in leg Musculoskeletal:   [] Joint swelling   [] Joint pain   [] Low back pain Hematologic:  [] Easy bruising  [] Easy bleeding   [] Hypercoagulable state   [] Anemic Gastrointestinal:  [] Diarrhea   [] Vomiting  [] Gastroesophageal reflux/heartburn   [] Difficulty swallowing. Genitourinary:  [] Chronic kidney disease   [] Difficult urination  [] Frequent urination   [] Blood in urine Skin:  [] Rashes   [] Ulcers  Psychological:  [] History of anxiety   []  History of major depression.  Physical Examination  Vitals:   12/17/23 1434  BP: (!) 137/95  Pulse: 62  Resp: 18  Weight: 139 lb (63 kg)  Height: 5' 4 (1.626 m)  Body mass index is 23.86 kg/m. Gen: WD/WN, NAD Head: [place]/AT, No temporalis wasting.  Ear/Nose/Throat: Hearing grossly intact, nares w/o erythema or drainage Eyes: PER, EOMI, sclera nonicteric.  Neck: Supple, no masses.  No bruit or JVD.  Pulmonary:  Good air movement, no audible wheezing, no use of accessory muscles.  Cardiac: RRR, normal S1, S2, no Murmurs. Vascular:  carotid bruit noted Vessel Right Left  Radial  Palpable Palpable  Carotid  Palpable  Palpable  Subclav  Palpable Palpable  Gastrointestinal: soft, non-distended. No guarding/no peritoneal signs.  Musculoskeletal: M/S 5/5 throughout.  No visible deformity.  Neurologic: CN 2-12 intact. Pain and light touch intact in extremities.  Symmetrical.  Speech is fluent. Motor exam as listed above. Psychiatric: Judgment intact, Mood & affect appropriate for pt's clinical situation. Dermatologic: No rashes or ulcers noted.  No changes consistent with cellulitis.   CBC Lab Results  Component Value Date   WBC 5.6 11/26/2023   HGB 13.4 11/26/2023   HCT 40.8 11/26/2023   MCV 89.9 11/26/2023   PLT 314 11/26/2023    BMET    Component Value Date/Time   NA 138 12/03/2023 1612   NA 140 02/26/2013 1315   K 4.8 12/03/2023 1612   K 4.0 02/26/2013 1315   CL 101 12/03/2023 1612   CL 108 (H) 02/26/2013 1315   CO2 22 12/03/2023 1612   CO2 27 02/26/2013 1315   GLUCOSE 89 12/03/2023 1612   GLUCOSE 103 (H) 11/26/2023 1256   GLUCOSE 95 02/26/2013 1315   BUN 7 (L) 12/03/2023 1612   BUN 13 02/26/2013 1315   CREATININE 0.73 12/03/2023 1612   CREATININE 0.73 11/26/2023 1256   CREATININE 0.85 12/07/2016 1228   CALCIUM  9.6 12/03/2023 1612   CALCIUM  9.2 02/26/2013 1315   GFRNONAA >60 11/26/2023 1256   GFRNONAA >60 02/26/2013 1315   GFRAA >60 11/26/2019 1213   GFRAA >60 02/26/2013 1315   Estimated Creatinine Clearance: 61.3 mL/min (by C-G formula based on SCr of 0.73 mg/dL).  COAG Lab Results  Component Value Date   INR 1.2 11/14/2023   INR 1.2 01/03/2022   INR 1.6 (H) 08/05/2021    Radiology No results found.   Assessment/Plan There are no diagnoses linked to this encounter.   Cordella Shawl, MD  12/17/2023 2:43 PM

## 2023-12-20 ENCOUNTER — Other Ambulatory Visit: Payer: Self-pay | Admitting: Nurse Practitioner

## 2023-12-20 ENCOUNTER — Telehealth: Payer: Self-pay | Admitting: Nurse Practitioner

## 2023-12-20 ENCOUNTER — Encounter (INDEPENDENT_AMBULATORY_CARE_PROVIDER_SITE_OTHER): Payer: Self-pay | Admitting: Vascular Surgery

## 2023-12-20 DIAGNOSIS — G894 Chronic pain syndrome: Secondary | ICD-10-CM

## 2023-12-20 DIAGNOSIS — Z79899 Other long term (current) drug therapy: Secondary | ICD-10-CM

## 2023-12-20 DIAGNOSIS — M961 Postlaminectomy syndrome, not elsewhere classified: Secondary | ICD-10-CM

## 2023-12-20 DIAGNOSIS — I724 Aneurysm of artery of lower extremity: Secondary | ICD-10-CM | POA: Insufficient documentation

## 2023-12-20 NOTE — Telephone Encounter (Signed)
 Patient states pharmacy told her they do not have a script to fill for October. Please check on this and notify patient

## 2023-12-20 NOTE — Telephone Encounter (Signed)
 Called pharmacy and they states that it has been resolved.  They did have the script.

## 2024-01-22 NOTE — Progress Notes (Unsigned)
 Electrophysiology Clinic Note    Date:  01/23/2024  Patient ID:  [name], [name] 1961/01/26, MRN [id number] PCP:  Myrla Jon HERO, MD  Cardiologist:  Redell Cave, MD   Electrophysiologist:  OLE ONEIDA HOLTS, MD  Electrophysiology APP:  Kailly Richoux, NP    Discussed the use of AI scribe software for clinical note transcription with the patient, who gave verbal consent to proceed.   Patient Profile    Chief Complaint: Afib, tikosyn  follow-up  History of Present Illness: [name] is a 63 y.o. female with PMH notable for persis Afib, HTN, OSA intolerant to CPAP; seen today for OLE ONEIDA HOLTS, MD for routine electrophysiology followup.   She is s/p AFib ablation 2022 by Dr. Holts, redo AF ablation in 2023 at [facility], and repeat AFib, atypical flutter ablation 11/2022 w Dr. Holts.  She had recurrence of Afib and was loaded on tikosyn  04/2023.   I last saw her 10/2023 where she had a recent increase in AFib, thought d/t UTI, overall improvement in AF burden since starting tikosyn .   On follow-up today, she has AFib episodes that last less than 5 minutes about twice a month, which is a significant improvement in her AFib burden. She remains active renovating her home.  She takes tikosyn  8a/8p along with eliquis . No bleeding concerns.      Arrhythmia/Device History Amiodarone  - stopped d/t young age after 11/2022 ablation. Patient says it was ineffective.  Flecainide  - SE: headache, lower extremity edema  Tikosyn  - loaded 04/2023    ROS:  Please see the history of present illness. All other systems are reviewed and otherwise negative.    Physical Exam    VS:  BP 114/64 (BP Location: Left Arm, Patient Position: Sitting, Cuff Size: Normal)   Pulse 61   Ht 5' 4 (1.626 m)   Wt 137 lb 6.4 oz (62.3 kg)   SpO2 97%   BMI 23.58 kg/m  BMI: Body mass index is 23.58 kg/m.      Wt Readings from Last 3 Encounters:  01/23/24 137 lb 6.4 oz (62.3 kg)   12/17/23 139 lb (63 kg)  12/03/23 137 lb 12.8 oz (62.5 kg)     GEN- The patient is well appearing, alert and oriented x 3 today.   Lungs- Clear to ausculation bilaterally, normal work of breathing.  Heart- Regular rate and rhythm, no murmurs, rubs or gallops Extremities- No peripheral edema, warm, dry    Studies Reviewed   Previous EP, cardiology notes.    EKG is ordered. Personal review of EKG from today shows:    EKG Interpretation Date/Time:  Wednesday January 23 2024 13:27:40 EST Ventricular Rate:  61 PR Interval:  124 QRS Duration:  84 QT Interval:  482 QTC Calculation: 485 R Axis:   3  Text Interpretation: Normal sinus rhythm Confirmed by Ranjit Ashurst [phone]) on 01/23/2024 1:33:18 PM    10/2023 EKG - SR at 66; QTC  06/06/2023 EKG - SR at 68bpm; QTC  Long term monitor, 04/30/2023 HR 46 - 179 bpm, average 61 bpm. 117 nonsustained SVT, longest 17 beats. Occasional supraventricular ectopy, 3.8% Rare ventricular ectopy. No sustained arrhythmias. No atrial fibrillation.  TTE, 07/31/2021  1. Left ventricular ejection fraction, by estimation, is 55 to 60%. Left ventricular ejection fraction by 2D MOD biplane is 55.3 %. The left ventricle has normal function. The left ventricle has no regional wall motion abnormalities. There is mild left ventricular hypertrophy. Left ventricular diastolic  parameters are indeterminate.   2. Right ventricular systolic function is normal. The right ventricular size is normal.   3. The mitral valve is normal in structure. Trivial mitral valve regurgitation.   4. The aortic valve is tricuspid. Aortic valve regurgitation is trivial.    Assessment and Plan     #) persis Afib #) aflutter #) tikosyn  monitoring S/p multiple AFib, aflutter ablation Continues to have brief AFib episodes, significantly improved from prior EKG with slightly prolonged, but stable QTC Continue tikosyn  BID Update BMP, mag today    #)  Hypercoag d/t persis afib CHA2DS2-VASc Score = at least 3 [CHF History: 0, HTN History: 1, Diabetes History: 0, Stroke History: 0, Vascular Disease History: 1, Age Score: 0, Gender Score: 1].  Therefore, the patient's annual risk of stroke is 3.2 %.    Stroke ppx - 5mg  eliquis , appropriately dosed No bleeding concerns          Current medicines are reviewed at length with the patient today.   The patient does not have concerns regarding her medicines.  The following changes were made today:  none  Labs/ tests ordered today include:  Orders Placed This Encounter  Procedures   Basic metabolic panel with GFR   Magnesium    EKG 12-Lead     Disposition: Follow up with Dr. Kennyth or EP APP in 3-4 months , prefer MD to establish care   Signed, Chantal Needle, NP  01/23/24  1:44 PM  Electrophysiology [facility]

## 2024-01-23 ENCOUNTER — Ambulatory Visit: Attending: Cardiology | Admitting: Cardiology

## 2024-01-23 ENCOUNTER — Encounter: Payer: Self-pay | Admitting: Cardiology

## 2024-01-23 VITALS — BP 114/64 | HR 61 | Ht 64.0 in | Wt 137.4 lb

## 2024-01-23 DIAGNOSIS — I4819 Other persistent atrial fibrillation: Secondary | ICD-10-CM | POA: Diagnosis not present

## 2024-01-23 DIAGNOSIS — I4892 Unspecified atrial flutter: Secondary | ICD-10-CM | POA: Diagnosis not present

## 2024-01-23 DIAGNOSIS — Z79899 Other long term (current) drug therapy: Secondary | ICD-10-CM

## 2024-01-23 DIAGNOSIS — Z5181 Encounter for therapeutic drug level monitoring: Secondary | ICD-10-CM

## 2024-01-23 DIAGNOSIS — D6869 Other thrombophilia: Secondary | ICD-10-CM

## 2024-01-23 NOTE — Patient Instructions (Signed)
 Medication Instructions:  Your physician recommends that you continue on your current medications as directed. Please refer to the Current Medication list given to you today.   *If you need a refill on your cardiac medications before your next appointment, please call your pharmacy*  Lab Work: Your provider would like for you to have following labs drawn today BMP and Magnesium .   If you have labs (blood work) drawn today and your tests are completely normal, you will receive your results only by: MyChart Message (if you have MyChart) OR A paper copy in the mail If you have any lab test that is abnormal or we need to change your treatment, we will call you to review the results.  Testing/Procedures: No test ordered today   Follow-Up: At [facility], you and your health needs are our priority.  As part of our continuing mission to provide you with exceptional heart care, our providers are all part of one team.  This team includes your primary Cardiologist (physician) and Advanced Practice Providers or APPs (Physician Assistants and Nurse Practitioners) who all work together to provide you with the care you need, when you need it.  Your next appointment:   3 month(s)  Provider:   Suzann Riddle, NP or Dr. Kennyth    We recommend signing up for the patient portal called MyChart.  Sign up information is provided on this After Visit Summary.  MyChart is used to connect with patients for Virtual Visits (Telemedicine).  Patients are able to view lab/test results, encounter notes, upcoming appointments, etc.  Non-urgent messages can be sent to your provider as well.   To learn more about what you can do with MyChart, go to [email or web address].

## 2024-01-24 ENCOUNTER — Ambulatory Visit: Payer: Self-pay | Admitting: Cardiology

## 2024-01-24 DIAGNOSIS — Z79899 Other long term (current) drug therapy: Secondary | ICD-10-CM

## 2024-01-24 DIAGNOSIS — I4819 Other persistent atrial fibrillation: Secondary | ICD-10-CM

## 2024-01-24 LAB — BASIC METABOLIC PANEL WITH GFR
BUN/Creatinine Ratio: 9 — ABNORMAL LOW (ref 12–28)
BUN: 7 mg/dL — ABNORMAL LOW (ref 8–27)
CO2: 17 mmol/L — ABNORMAL LOW (ref 20–29)
Calcium: 9.5 mg/dL (ref 8.7–10.3)
Chloride: 103 mmol/L (ref 96–106)
Creatinine, Ser: 0.75 mg/dL (ref 0.57–1.00)
Glucose: 110 mg/dL — ABNORMAL HIGH (ref 70–99)
Potassium: 4.4 mmol/L (ref 3.5–5.2)
Sodium: 141 mmol/L (ref 134–144)
eGFR: 89 mL/min/1.73 (ref >59)

## 2024-01-24 LAB — MAGNESIUM: Magnesium: 2.1 mg/dL (ref 1.6–2.3)

## 2024-02-06 ENCOUNTER — Ambulatory Visit: Attending: Cardiology

## 2024-02-06 ENCOUNTER — Ambulatory Visit: Payer: Self-pay | Admitting: Cardiology

## 2024-02-06 VITALS — HR 66 | Ht 64.0 in | Wt 138.0 lb

## 2024-02-06 DIAGNOSIS — I4891 Unspecified atrial fibrillation: Secondary | ICD-10-CM

## 2024-02-06 NOTE — Progress Notes (Signed)
   Nurse Visit   Date of Encounter: 02/06/2024 ID: [name], DOB 1960/04/27, MRN [id number]  PCP:  Myrla Jon HERO, MD   [facility] Providers Cardiologist:  Redell Cave, MD Electrophysiologist:  OLE ONEIDA HOLTS, MD  Electrophysiology APP:  Riddle, Suzann, NP      Visit Details   VS:  Pulse 66   Ht 5' 4 (1.626 m)   Wt 138 lb (62.6 kg)   BMI 23.69 kg/m  , BMI Body mass index is 23.69 kg/m.  Wt Readings from Last 3 Encounters:  02/06/24 138 lb (62.6 kg)  01/23/24 137 lb 6.4 oz (62.3 kg)  12/17/23 139 lb (63 kg)     Reason for visit: EKG check Performed today: Vitals, EKG, and Provider consulted:Suzann Riddle, NP Changes (medications, testing, etc.) : no changes  Length of Visit: 15 minutes    Medications Adjustments/Labs and Tests Ordered: Orders Placed This Encounter  Procedures   EKG 12-Lead   No orders of the defined types were placed in this encounter.    Signed, Russell LOISE Gully, RN  02/06/2024 2:58 PM

## 2024-02-06 NOTE — Patient Instructions (Signed)
 Medication Instructions:  Your physician recommends that you continue on your current medications as directed. Please refer to the Current Medication list given to you today.    *If you need a refill on your cardiac medications before your next appointment, please call your pharmacy*  Lab Work: No labs ordered today    Testing/Procedures: No test ordered today   Follow-Up: At [facility], you and your health needs are our priority.  As part of our continuing mission to provide you with exceptional heart care, our providers are all part of one team.  This team includes your primary Cardiologist (physician) and Advanced Practice Providers or APPs (Physician Assistants and Nurse Practitioners) who all work together to provide you with the care you need, when you need it.  Your next appointment:   2 month(s)  Provider:   Suzann Riddle, NP

## 2024-02-13 ENCOUNTER — Other Ambulatory Visit: Payer: Self-pay | Admitting: Family Medicine

## 2024-02-13 DIAGNOSIS — J45909 Unspecified asthma, uncomplicated: Secondary | ICD-10-CM

## 2024-02-15 DIAGNOSIS — Z79899 Other long term (current) drug therapy: Secondary | ICD-10-CM | POA: Insufficient documentation

## 2024-02-15 NOTE — Progress Notes (Signed)
 PROVIDER NOTE: Interpretation of information contained herein should be left to medically-trained personnel. Specific patient instructions are provided elsewhere under Patient Instructions section of medical record. This document was created in part using AI and STT-dictation technology, any transcriptional errors that may result from this process are unintentional.  Patient: [name]  Service: E/M   PCP: Myrla Jon HERO, MD  DOB: Jul 31, 1960  DOS: 02/18/2024  Provider: Emmy MARLA Blanch, NP  MRN: [id number]  Delivery: Face-to-face  Specialty: Interventional Pain Management  Type: Established Patient  Setting: Ambulatory outpatient facility  Specialty designation: 09  Referring Prov.: Myrla Jon HERO, MD  Location: Outpatient office facility       History of present illness (HPI) [name], a 63 y.o. year old female, is here today because of her Chronic pain syndrome [G89.4]. Ms. Shibuya primary complain today is Back Pain  Pertinent problems: Ms. 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  Pain Assessment: Severity of Chronic pain is reported as a 3 /10. Location: Back Lower/Down right leg. Onset: More than a month ago. Quality: Sharp. Timing: Intermittent. Modifying factor(s): Rest, heating pad. Vitals:  height is 5' 4 (1.626 m) and weight is 137 lb (62.1 kg). Her temperature is 97 F (36.1 C) (abnormal). Her blood pressure is 158/88 (abnormal) and her pulse is 61. Her respiration is 18 and oxygen saturation is 100%.  BMI: Estimated body mass index is 23.52 kg/m as calculated from the following:   Height as of this encounter: 5' 4 (1.626 m).   Weight as of this encounter: 137 lb (62.1 kg).  Last encounter: 11/21/2023. Last procedure: Visit date not found.  Reason for encounter: medication management.  The patient indicates doing well with current medication regimen.  No side effects or adverse reaction reported to medication.  Discussed the use of AI scribe software for clinical note transcription with the patient, who gave verbal consent to proceed.  History of Present Illness   [name] is a 63 year old female who presents with low back pain.  She experiences low back pain, which she attributes to arthritis and nerve tightness. Her recent consultation included x-rays, and she recalls her surgeon saying that her last disc looks good. She reports that her arthritis pain worsens with weather changes, and she experiences increased pain when her nerves feel inflamed.  She is unable to take anti-inflammatory medications due to her current prescription of Tikosyn , which restricts her from using medications like ibuprofen . She can take Tylenol , but finds it ineffective for her pain management.  She is currently on morphine  for pain relief and reports no side effects or  adverse reactions from its use. Her pharmacy is remains same and her prescription is due for refill on February 21, 2024.     Pharmacotherapy Assessment   Morphine  (MS IR) 15 mg tablet every 6 hours as needed for pain. MME=90 (Sta[place] on 11/21/2023) Monitoring: [place] PMP: PDMP reviewed during this encounter.       Pharmacotherapy: No side-effects or adverse reactions reported. Compliance: No problems identified. Effectiveness: Clinically acceptable.  [name], [place]  02/18/2024 11:19 AM  Sign when Signing Visit Nursing Pain Medication Assessment:  Safety precautions to be maintained throughout the outpatient stay will include: orient to surroundings, keep bed in low position, maintain call bell within reach at all times, provide assistance with transfer out of bed and ambulation.  Medication Inspection Compliance: Pill count conducted under aseptic conditions, in front of the patient. Neither the pills nor the bottle was removed from the patient's sight at any time. Once count was completed pills were immediately returned to the patient in their original bottle.  Medication: Morphine  IR Pill/Patch Count: 14 of 120 pills/patches remain Pill/Patch Appearance: Markings consistent with prescribed medication Bottle Appearance: Standard pharmacy container. Clearly labeled. Filled Date: 55 / 72 / 2025 Last Medication intake:  Today    UDS:  Summary  Date Value Ref Range Status  09/12/2023 FINAL  Final    Comment:    ==================================================================== [employer] Select 13 (MW) ==================================================================== Test                             Result       Flag       Units  Drug Present and Declared for Prescription Verification   Alprazolam                      223          EXPECTED   ng/mg creat   Alpha-hydroxyalprazolam        570          EXPECTED   ng/mg creat    Source of alprazolam  is a scheduled prescription  medication. Alpha-    hydroxyalprazolam is an expected metabolite of alprazolam .    Morphine                        >22727       EXPECTED   ng/mg creat   Normorphine                    1882         EXPECTED   ng/mg creat    Potential sources of large amounts of morphine  in the absence of    codeine include administration of morphine  or use of heroin.     Normorphine is an expected metabolite of morphine .    Hydromorphone                   480          EXPECTED   ng/mg creat    Hydromorphone  may be present as a metabolite of morphine ;    concentrations of hydromorphone  rarely exceed 5% of the morphine     concentration when this is the source of hydromorphone .  ==================================================================== Test                      Result    Flag   Units      Ref Range  Creatinine              44               mg/dL      >=79 ==================================================================== Declared Medications:  The flagging and interpretation on this report are based on the  following declared medications.  Unexpected results may arise from  inaccuracies in the declared medications.   **Note: The testing scope of this panel includes these medications:   Alprazolam  (Xanax )  Morphine  (MSIR)   **Note: The testing scope of this panel does not include the  following reported medications:   Acetaminophen  (Tylenol )  Apixaban  (Eliquis )  Cholecalciferol  Dexlansoprazole  (Dexilant )  Dofetilide  (Tikosyn )  Linaclotide  (Linzess )  Mometasone   Montelukast  (Singulair )  Naloxone  (Narcan )  Valacyclovir  (Valtrex ) ==================================================================== For clinical consultation, please call [phone]. ====================================================================     No results found for: CBDTHCR No results found for: D8THCCBX No results found for: D9THCCBX  ROS  Constitutional: Denies any fever or  chills Gastrointestinal: No reported hemesis, hematochezia, vomiting, or acute GI distress Musculoskeletal: Low back pain Neurological: No reported episodes of acute onset apraxia, aphasia, dysarthria, agnosia, amnesia, paralysis, loss of coordination, or loss of consciousness  Medication Review  ALPRAZolam , Magnesium  Glycinate, acetaminophen , apixaban , cholecalciferol, dexlansoprazole , dofetilide , linaclotide , montelukast , morphine , and valACYclovir   History Review  Allergy: Ms. Hendriks is allergic to amoxicillin, chlorhexidine  gluconate, clindamycin, codeine, erythromycin, penicillin g, sulfa antibiotics, shellfish allergy, decadron  [dexamethasone ], flecainide , papaya derivatives, betadine [povidone iodine], other, and prednisone . Drug: Ms. Beachem  reports current drug use. Alcohol:  reports no history of alcohol use. Tobacco:  reports that she has never smoked. She has never used smokeless tobacco. Social: Ms. Derosa  reports that she has never smoked. She has never used smokeless tobacco. She reports current drug use. She reports that she does not drink alcohol. 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), Allergy, Anemia, Anxiety, Arthritis, Asthma, Cataract, Cephalalgia (08/18/2014), Cervical disc disease (08/18/2014), Chronic headaches, Chronic, continuous use of opioids, DDD (degenerative disc disease), Depression, Dissociative disorder, Dizziness (04/22/2013), Duodenal ulcer with hemorrhage and perforation (HCC) (04/27/2003), Foot drop, right, GERD (gastroesophageal reflux disease), H/O arthrodesis (08/18/2014), Headache(784.0), History of blood transfusion, History of cardiac cath, History of cardioversion, History of cervical spinal surgery (01/04/2015), History of kidney stones, Hypertension, Inverted T wave, Iron  deficiency (02/01/2021), Mitral regurgitation, Myocardial  infarction (HCC) (2021), Narrowing of intervertebral disc space (08/18/2014), Neuromuscular disorder (HCC), Orthostatic hypotension (04/22/2013), Pain, Paroxysmal atrial fibrillation (HCC) (2021), Pneumonia, PONV (postoperative nausea and vomiting), Postop Hyponatremia (05/14/2012), Postoperative anemia due to acute blood loss (05/14/2012), PTSD (post-traumatic stress disorder), Right foot drop, Right hip arthralgia (08/18/2014), Sleep apnea (2021), Therapeutic opioid-induced constipation (OIC), and Typical atrial flutter (HCC). Surgical: Ms. Martine  has a past surgical history that includes RIGHT HIP ARTHROSCOPY FOR LABRAL TEAR (ABOUT 2010); Anterior fusion cervical spine (07/2010); Nasal septum surgery (05/2011); Back surgery (2009); Tonsillectomy; Abdominal hysterectomy; DIAGNOSTIC LAPAROSCOPIES - MULTIPLE FOR ENDOMETRIOSIS; Cholecystectomy; Hip arthroscopy (09/20/2011); Shoulder arthroscopy (05/06/2012); Carpal tunnel release (05/06/2012); Total hip arthroplasty (Right, 05/13/2012); Anterior cervical decomp/discectomy fusion (N/A, 10/30/2012); Posterior cervical fusion/foraminotomy (N/A, 04/16/2013); Breast reduction surgery (Bilateral, 06/2016); Colonoscopy with propofol  (N/A, 01/26/2017); Esophagogastroduodenoscopy (egd) with propofol  (N/A, 01/26/2017); Esophageal dilation (N/A, 01/26/2017); polypectomy (N/A, 01/26/2017); PH impedance study (N/A, 05/30/2017); Esophageal manometry (N/A, 05/30/2017); Radiofrequency ablation nerves; afib (05/2019); LEFT HEART CATH AND CORONARY ANGIOGRAPHY (Left, 06/10/2019); Cardioversion (N/A, 12/03/2019); Esophagogastroduodenoscopy (egd) with propofol  (N/A, 01/30/2020); Reduction mammaplasty (Bilateral, 05/2016);  Breast biopsy (Right, 2008); Breast excisional biopsy (Left, 1998); TEE without cardioversion (N/A, 08/05/2020); ATRIAL FIBRILLATION ABLATION (N/A, 08/06/2020); Cardiac electrophysiology mapping and ablation; Esophagogastroduodenoscopy (egd) with propofol  (N/A,  01/15/2021); Colonoscopy with propofol  (N/A, 01/16/2021); Cardioversion (N/A, 08/01/2021); Cardioversion (N/A, 08/05/2021); Esophagogastroduodenoscopy (egd) with propofol  (N/A, 12/07/2021); Lower Extremity Angiography (Left, 01/06/2022); ATRIAL FIBRILLATION ABLATION (N/A, 12/04/2022); Hernia repair (5 years ago); Eye surgery (20 years ago); Joint replacement (8 years ago); and Spine surgery (15 years ago). Family: family history includes Aneurysm in her maternal grandmother and mother; Anxiety disorder in her grandchild; Bipolar disorder in her grandchild and sister; Breast cancer (age of onset: 27) in her paternal grandmother; Depression in her grandchild.  Laboratory Chemistry Profile   Renal Lab Results  Component Value Date   BUN 7 (L) 01/23/2024   CREATININE 0.75 01/23/2024   BCR 9 (L) 01/23/2024   GFRAA >60 11/26/2019   GFRNONAA >60 11/26/2023    Hepatic Lab Results  Component Value Date   AST 15 11/14/2023   ALT 9 11/14/2023   ALBUMIN 4.0 11/14/2023   ALKPHOS 113 11/14/2023   LIPASE 26 08/08/2021    Electrolytes Lab Results  Component Value Date   NA 141 01/23/2024   K 4.4 01/23/2024   CL 103 01/23/2024   CALCIUM  9.5 01/23/2024   MG 2.1 01/23/2024    Bone Lab Results  Component Value Date   VD25OH 24.9 (L) 12/03/2023   25OHVITD1 23 (L) 11/11/2018   25OHVITD2 9.8 11/11/2018   25OHVITD3 13 11/11/2018    Inflammation (CRP: Acute Phase) (ESR: Chronic Phase) Lab Results  Component Value Date   CRP 12 (H) 11/11/2018   ESRSEDRATE 42 (H) 11/11/2018   LATICACIDVEN 1.0 01/14/2021         Note: Above Lab results reviewed.  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 Comparison Study: 02/15/2023  Performing Technologist: Leafy Gibes RVS    Examination Guidelines: A complete evaluation includes B-mode imaging, spectral Doppler, color Doppler, and power Doppler as needed of all accessible portions of each vessel. Bilateral testing is considered an integral part of a complete examination. Limited examinations for reoccurring indications may be performed as noted.  +-----------+----------+---------+------+----------+ Left DuplexPSV (cm/s)Waveform PlaqueComment(s) +-----------+----------+---------+------+----------+ CFA           157    triphasic                 +-----------+----------+---------+------+----------+ PFA            54    triphasic                 +-----------+----------+---------+------+----------+ Prox SFA       81    triphasic                 +-----------+----------+---------+------+----------+    Summary: No obvious evidence of active pseudoaneurysm or AV fistula noted in the left groin.  Diagnosing physician: Cordella Shawl MD Electronically signed by Cordella Shawl MD  on 12/24/2023 at 4:22:32 PM.      --------------------------------------------------------------------------------      Final   Note: Reviewed        Physical Exam  Vitals: BP (!) 158/88 (BP Location: Right Arm, Patient Position: Sitting)   Pulse 61   Temp (!) 97 F (36.1 C)   Resp 18   Ht 5' 4 (1.626 m)   Wt 137 lb (62.1 kg)   SpO2 100%   BMI 23.52 kg/m  BMI: Estimated body mass index is 23.52 kg/m as calculated from the following:   Height as of this encounter: 5' 4 (1.626 m).   Weight as of this encounter: 137 lb (62.1 kg). Ideal: Ideal body weight: 54.7 kg (120 lb 9.5 oz) Adjusted ideal body  weight: 57.7 kg (127 lb 2.5 oz) General appearance: Well nourished, well developed, and well hydrated. In no apparent acute distress Mental status: Alert, oriented x 3 (person, place, & time)       Respiratory: No evidence of acute respiratory distress Eyes: PERLA  Musculoskeletal: +LBP Assessment   Diagnosis Status  1. Chronic pain syndrome   2. Medication management   3. Failed back syndrome of cervical spine   4. Chronic low back pain (1ry area of Pain) (Bilateral) (L>R) w/o sciatica   5. Pharmacologic therapy   6. Chronic neck pain with history of cervical spinal surgery   7. Chronic hip pain (2ry area of Pain) (Bilateral) (L>R)   8. Lumbar facet syndrome (Bilateral) (L>R)   9. Chronic shoulder pain (3ry area of Pain) (Bilateral) (L>R)    Controlled Controlled Controlled   Updated Problems: No problems updated.   Plan of Care  Problem-specific:  Assessment and Plan    Chronic pain syndrome due to postlaminectomy syndrome with chronic low back pain Chronic low back pain secondary to postlaminectomy syndrome, likely exacerbated by arthritis and nerve inflammation. Imaging shows good disc alignment. Pain likely due to nerve inflammation, possibly weather-related. Anti-inflammatory medications contraindicated due to Tikosyn .  Long-term opioid therapy for chronic pain On morphine  for chronic pain management without side effects. - Continue morphine  therapy. Patient's pain is controlled with morphine , will continue on current medication regimen.  Prescribing drug monitoring (PDMP) reviewed, findings consistent with the use of prescribed medication and no evidence of narcotic misuse or abuse.  Urine drug screening (UDS) up to date.  No side effects or adverse reaction reported to medication.  Schedule follow-up in 90 days for medication management.      Ms. AUDRA KAGEL has a current medication list which includes the following long-term medication(s): dexlansoprazole ,  dofetilide , eliquis , linaclotide , and montelukast .  Pharmacotherapy (Medications Ordered): Meds ordered this encounter  Medications   morphine  (MSIR) 15 MG tablet    Sig: Take 1 tablet (15 mg total) by mouth every 6 (six) hours as needed for moderate pain (pain score 4-6) or severe pain (pain score 7-10). Must last 30 days.    Dispense:  120 tablet    Refill:  0    Chronic Pain: STOP Act (Not applicable) Fill 1 day early if closed on refill date. Avoid benzodiazepines within 8 hours of opioids   morphine  (MSIR) 15 MG tablet    Sig: Take 1 tablet (15 mg total) by mouth every 6 (six) hours as needed for moderate pain (pain score 4-6) or severe pain (pain score 7-10). Must last 30 days.    Dispense:  120 tablet    Refill:  0    Chronic Pain: STOP Act (Not  applicable) Fill 1 day early if closed on refill date. Avoid benzodiazepines within 8 hours of opioids   morphine  (MSIR) 15 MG tablet    Sig: Take 1 tablet (15 mg total) by mouth every 6 (six) hours as needed for moderate pain (pain score 4-6) or severe pain (pain score 7-10). Must last 30 days.    Dispense:  120 tablet    Refill:  0    Chronic Pain: STOP Act (Not applicable) Fill 1 day early if closed on refill date. Avoid benzodiazepines within 8 hours of opioids   Orders:  No orders of the defined types were placed in this encounter.       Return in about 3 months (around 05/18/2024) for (F2F), (MM), Emmy Blanch NP.    Recent Visits Date Type Provider Dept  11/21/23 Office Visit Jaydian Santana K, NP [facility]  Showing recent visits within past 90 days and meeting all other requirements Today's Visits Date Type Provider Dept  02/18/24 Office Visit Aydan Phoenix K, NP [facility]  Showing today's visits and meeting all other requirements Future Appointments No visits were found meeting these conditions. Showing future appointments within next 90 days and meeting all other requirements  I discussed the  assessment and treatment plan with the patient. The patient was provided an opportunity to ask questions and all were answered. The patient agreed with the plan and demonstrated an understanding of the instructions.  Patient advised to call back or seek an in-person evaluation if the symptoms or condition worsens.  I personally spent a total of 30 minutes in the care of the patient today including preparing to see the patient, getting/reviewing separately obtained history, performing a medically appropriate exam/evaluation, counseling and educating, placing orders, referring and communicating with other health care professionals, documenting clinical information in the EHR, independently interpreting results, communicating results, and coordinating care.   Note by: Latrisha Coiro K Hermie Reagor, NP (TTS and AI technology used. I apologize for any typographical errors that were not detected and corrected.) Date: 02/18/2024; Time: 11:45 AM

## 2024-02-18 ENCOUNTER — Ambulatory Visit: Attending: Nurse Practitioner | Admitting: Nurse Practitioner

## 2024-02-18 ENCOUNTER — Encounter: Payer: Self-pay | Admitting: Nurse Practitioner

## 2024-02-18 ENCOUNTER — Ambulatory Visit (INDEPENDENT_AMBULATORY_CARE_PROVIDER_SITE_OTHER): Payer: Medicare Other | Admitting: Vascular Surgery

## 2024-02-18 ENCOUNTER — Encounter (INDEPENDENT_AMBULATORY_CARE_PROVIDER_SITE_OTHER): Payer: Medicare Other

## 2024-02-18 VITALS — BP 158/88 | HR 61 | Temp 97.0°F | Resp 18 | Ht 64.0 in | Wt 137.0 lb

## 2024-02-18 DIAGNOSIS — Z79899 Other long term (current) drug therapy: Secondary | ICD-10-CM

## 2024-02-18 DIAGNOSIS — M47816 Spondylosis without myelopathy or radiculopathy, lumbar region: Secondary | ICD-10-CM

## 2024-02-18 DIAGNOSIS — M961 Postlaminectomy syndrome, not elsewhere classified: Secondary | ICD-10-CM

## 2024-02-18 DIAGNOSIS — G8929 Other chronic pain: Secondary | ICD-10-CM

## 2024-02-18 DIAGNOSIS — G8928 Other chronic postprocedural pain: Secondary | ICD-10-CM

## 2024-02-18 DIAGNOSIS — G894 Chronic pain syndrome: Secondary | ICD-10-CM

## 2024-02-18 MED ORDER — MORPHINE SULFATE 15 MG PO TABS: 15.0000 mg | ORAL_TABLET | Freq: Four times a day (QID) | ORAL | 0 refills | Status: AC | PRN

## 2024-02-18 NOTE — Progress Notes (Signed)
 Nursing Pain Medication Assessment:  Safety precautions to be maintained throughout the outpatient stay will include: orient to surroundings, keep bed in low position, maintain call bell within reach at all times, provide assistance with transfer out of bed and ambulation.  Medication Inspection Compliance: Pill count conducted under aseptic conditions, in front of the patient. Neither the pills nor the bottle was removed from the patient's sight at any time. Once count was completed pills were immediately returned to the patient in their original bottle.  Medication: Morphine  IR Pill/Patch Count: 14 of 120 pills/patches remain Pill/Patch Appearance: Markings consistent with prescribed medication Bottle Appearance: Standard pharmacy container. Clearly labeled. Filled Date: 83 / 35 / 2025 Last Medication intake:  Today

## 2024-02-21 ENCOUNTER — Encounter: Payer: Self-pay | Admitting: Family Medicine

## 2024-02-28 ENCOUNTER — Encounter: Payer: Self-pay | Admitting: Family Medicine

## 2024-02-28 ENCOUNTER — Ambulatory Visit: Admitting: Family Medicine

## 2024-02-28 VITALS — BP 132/87 | HR 69 | Ht 64.0 in | Wt 134.9 lb

## 2024-02-28 DIAGNOSIS — G8929 Other chronic pain: Secondary | ICD-10-CM | POA: Diagnosis not present

## 2024-02-28 DIAGNOSIS — I10 Essential (primary) hypertension: Secondary | ICD-10-CM | POA: Diagnosis not present

## 2024-02-28 DIAGNOSIS — R739 Hyperglycemia, unspecified: Secondary | ICD-10-CM | POA: Diagnosis not present

## 2024-02-28 DIAGNOSIS — M961 Postlaminectomy syndrome, not elsewhere classified: Secondary | ICD-10-CM | POA: Diagnosis not present

## 2024-02-28 DIAGNOSIS — E7800 Pure hypercholesterolemia, unspecified: Secondary | ICD-10-CM | POA: Diagnosis not present

## 2024-02-28 DIAGNOSIS — R1310 Dysphagia, unspecified: Secondary | ICD-10-CM | POA: Diagnosis not present

## 2024-02-28 DIAGNOSIS — Z0000 Encounter for general adult medical examination without abnormal findings: Secondary | ICD-10-CM

## 2024-02-28 DIAGNOSIS — M47816 Spondylosis without myelopathy or radiculopathy, lumbar region: Secondary | ICD-10-CM

## 2024-02-28 DIAGNOSIS — F332 Major depressive disorder, recurrent severe without psychotic features: Secondary | ICD-10-CM

## 2024-02-28 DIAGNOSIS — Z23 Encounter for immunization: Secondary | ICD-10-CM

## 2024-02-28 DIAGNOSIS — M545 Low back pain, unspecified: Secondary | ICD-10-CM

## 2024-02-28 DIAGNOSIS — Z1231 Encounter for screening mammogram for malignant neoplasm of breast: Secondary | ICD-10-CM

## 2024-02-28 NOTE — Progress Notes (Signed)
 Complete physical exam   Patient: [name]   DOB: 17-Dec-1960   63 y.o. Female  MRN: [id number] Visit Date: 02/28/2024  Today's healthcare provider: Jon Eva, MD   Chief Complaint  Patient presents with   Annual Exam    Last completed 02/20/23 Diet - unhealthy Exercise - staying active but no regimen Feeling - fairly well Sleeping - fairly well   Subjective    [name] is a 63 y.o. female who presents today for a complete physical exam.   Discussed the use of AI scribe software for clinical note transcription with the patient, who gave verbal consent to proceed.  History of Present Illness   [name] is a 63 year old female who presents for an annual physical exam.  She reports persistent difficulty eating with early satiety, a sensation of food getting hung up after a few bites, stomach aches, and significant weight loss. A gastroenterologist recommended esophageal dilation and stomach surgery, which she declined due to perceived risks and past reflux issues. She might consider dilation if symptoms worsen but does not want stomach surgery.  She had polyps found on colonoscopy, but the specimen was lost so the type is unknown. She recalls being told to repeat colonoscopy in five years and is considering changing gastroenterologists because she distrusts her current one.  She is due for a mammogram in January. She has received prior COVID vaccines and plans to get updated COVID and RSV vaccines at a pharmacy. She is hesitant about the shingles vaccine because she never had chickenpox but understands it is not a live vaccine.  She tracks elevated blood sugars at home and wants an A1c checked. She has pancreatic insufficiency diagnosed during a hospitalization and could not tolerate the prescribed pancreatic enzyme medication.  She has severe chronic back pain not relieved by morphine . Her back surgeon recommended water  aerobics, but she has not  found an accessible facility and is interested in physical therapy that offers water  therapy.  She has precancerous skin lesions in the past and is worried about a new mole. She plans to see her dermatologist for evaluation.        Last depression screening scores    02/28/2024    8:12 AM 02/18/2024   11:17 AM 12/03/2023    3:23 PM  PHQ 2/9 Scores  PHQ - 2 Score 0 0 0  PHQ- 9 Score 7  8      Data saved with a previous flowsheet row definition   Last fall risk screening    02/28/2024    8:11 AM  Fall Risk   Falls in the past year? 0  Number falls in past yr: 0  Injury with Fall? 0  Risk for fall due to : No Fall Risks  Follow up Falls evaluation completed        Medications: Show/hide medication list[1]  Review of Systems    Objective    BP 132/87 (BP Location: Left Arm, Patient Position: Sitting, Cuff Size: Normal)   Pulse 69   Ht 5' 4 (1.626 m)   Wt 134 lb 14.4 oz (61.2 kg)   SpO2 100%   BMI 23.16 kg/m    Physical Exam Vitals reviewed.  Constitutional:      General: She is not in acute distress.    Appearance: Normal appearance. She is well-developed. She is not diaphoretic.  HENT:     Head: Normocephalic and atraumatic.     Right Ear:  Tympanic membrane, ear canal and external ear normal.     Left Ear: Tympanic membrane, ear canal and external ear normal.     Nose: Nose normal.     Mouth/Throat:     Mouth: Mucous membranes are moist.     Pharynx: Oropharynx is clear. No oropharyngeal exudate.  Eyes:     General: No scleral icterus.    Conjunctiva/sclera: Conjunctivae normal.     Pupils: Pupils are equal, round, and reactive to light.  Neck:     Thyroid : No thyromegaly.  Cardiovascular:     Rate and Rhythm: Normal rate and regular rhythm.     Heart sounds: Normal heart sounds. No murmur heard. Pulmonary:     Effort: Pulmonary effort is normal. No respiratory distress.     Breath sounds: Normal breath sounds. No wheezing or rales.  Abdominal:      General: There is no distension.     Palpations: Abdomen is soft.     Tenderness: There is no abdominal tenderness.  Musculoskeletal:     Cervical back: Neck supple.     Right lower leg: No edema.     Left lower leg: No edema.  Lymphadenopathy:     Cervical: No cervical adenopathy.  Skin:    General: Skin is warm and dry.  Neurological:     Mental Status: She is alert and oriented to person, place, and time. Mental status is at baseline.     Gait: Gait normal.  Psychiatric:        Mood and Affect: Mood normal.        Behavior: Behavior normal.        Thought Content: Thought content normal.      No results found for any visits on 02/28/24.  Assessment & Plan    Routine Health Maintenance and Physical Exam  Exercise Activities and Dietary recommendations  Goals      Patient Stated     Raise my grandchildren        Immunization History  Administered Date(s) Administered   Influenza, Seasonal, Injecte, Preservative Fre 02/20/2023, 02/28/2024   Influenza,inj,Quad PF,6+ Mos 12/30/2015, 12/07/2016, 12/25/2017, 12/27/2018, 01/13/2020, 01/16/2021, 01/20/2022   PFIZER(Purple Top)SARS-COV-2 Vaccination 06/06/2019, 07/04/2019, 12/22/2019   PNEUMOCOCCAL CONJUGATE-20 02/20/2023   Pneumococcal Polysaccharide-23 12/30/2015   Tdap 02/27/2022    Health Maintenance  Topic Date Due   Zoster Vaccines- Shingrix (1 of 2) Never done   COVID-19 Vaccine (4 - 2025-26 season) 11/12/2023   Mammogram  04/10/2024   Medicare Annual Wellness (AWV)  09/23/2024   Colonoscopy  01/16/2026   DTaP/Tdap/Td (2 - Td or Tdap) 02/28/2032   Pneumococcal Vaccine: 50+ Years  Completed   Influenza Vaccine  Completed   Hepatitis C Screening  Completed   HIV Screening  Completed   Hepatitis B Vaccines 19-59 Average Risk  Aged Out   HPV VACCINES  Aged Out   Meningococcal B Vaccine  Aged Out    Discussed health benefits of physical activity, and encouraged her to engage in regular exercise  appropriate for her age and condition.  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)   Relevant Orders   Ambulatory referral to Physical Therapy   Chronic low back pain (1ry area  of Pain) (Bilateral) (L>R) w/o sciatica (Chronic)   Relevant Orders   Ambulatory referral to Physical Therapy   Lumbar facet syndrome (Bilateral) (L>R) (Chronic)   Relevant Orders   Ambulatory referral to Physical Therapy   Pure hypercholesterolemia   Relevant Orders   Comprehensive metabolic panel with GFR   Lipid panel   Severe episode of recurrent major depressive disorder, without psychotic features (HCC)   Chronic and well-controlled Followed by psychiatry      Other Visit Diagnoses       Encounter for annual physical exam    -  Primary   Relevant Orders   Hemoglobin A1c   Comprehensive metabolic panel with GFR   Lipid panel     Hyperglycemia       Relevant Orders   Hemoglobin A1c     Breast cancer screening by mammogram       Relevant Orders   MM 3D SCREENING MAMMOGRAM BILATERAL BREAST     Immunization due       Relevant Orders   Flu vaccine trivalent PF, 6mos and older(Flulaval,Afluria,Fluarix,Fluzone) (Completed)           Adult Wellness Visit Routine adult wellness visit with emphasis on regular screenings and vaccinations. - Ordered mammogram for January - Administered flu shot today - Recommended COVID and RSV vaccines at pharmacy - Recommended shingles vaccine series at pharmacy  Dysphagia with weight loss Chronic dysphagia with significant weight loss. Declined further esophageal dilation and gastric surgery due to risks of anesthesia and reflux. Considering esophageal dilation if swallowing worsens. Conscious sedation for dilation is less risky than  general anesthesia. - Continue to monitor swallowing function and weight - Discuss potential esophageal dilation with gastroenterologist if symptoms worsen  Pure hypercholesterolemia Hypercholesterolemia requiring regular monitoring. - Ordered cholesterol panel  Hyperglycemia Elevated blood sugar levels with dietary restrictions due to stomach issues. Monitoring blood sugar levels. - Ordered A1c test  Failed back surgical syndrome with chronic low back pain Chronic low back pain post-surgery with inadequate pain relief from morphine . Recommended water -based therapy for low-impact exercise. - Referred to physical therapy for water -based therapy - Explore water  aerobics options        Return in about 1 year (around 02/27/2025) for CPE.     Jon Eva, MD  [facility] [phone] (phone) [phone] (fax)  [facility]      [1]  Outpatient Medications Prior to Visit  Medication Sig   acetaminophen  (TYLENOL ) 325 MG tablet Take 2 tablets (650 mg total) by mouth every 6 (six) hours as needed for mild pain (or Fever >/= 101).   ALPRAZolam  (XANAX ) 1 MG tablet Take 1 mg by mouth at bedtime.    cholecalciferol (VITAMIN D3) 25 MCG (1000 UNIT) tablet Take 1,000 Units by mouth daily.   dexlansoprazole  (DEXILANT ) 60 MG capsule TAKE 1 CAPSULE BY MOUTH EVERY DAY   dofetilide  (TIKOSYN ) 500 MCG capsule Take 1 capsule (500 mcg total) by mouth 2 (two) times daily.   ELIQUIS  5 MG TABS tablet TAKE 1 TABLET BY MOUTH TWICE A DAY   linaclotide  (LINZESS ) 290 MCG CAPS capsule Take 1 capsule (290 mcg total) by mouth daily before breakfast.   MAGNESIUM  GLYCINATE PO Take by mouth.   montelukast  (SINGULAIR ) 10 MG tablet TAKE 1 TABLET BY MOUTH EVERY DAY   morphine  (MSIR) 15 MG tablet Take 1 tablet (15 mg total) by mouth every 6 (six) hours as needed for moderate pain (pain score 4-6) or severe pain (pain  score 7-10). Must last 30 days.   [START ON  03/22/2024] morphine  (MSIR) 15 MG tablet Take 1 tablet (15 mg total) by mouth every 6 (six) hours as needed for moderate pain (pain score 4-6) or severe pain (pain score 7-10). Must last 30 days.   [START ON 04/21/2024] morphine  (MSIR) 15 MG tablet Take 1 tablet (15 mg total) by mouth every 6 (six) hours as needed for moderate pain (pain score 4-6) or severe pain (pain score 7-10). Must last 30 days.   valACYclovir  (VALTREX ) 1000 MG tablet TAKE TWO TABLETS BY MOUTH TWICE DAILY FOR ONE DAY FEVER BLISTERTAKE TWO TABLETS BY MOUTH TWICE DAILY FOR ONE DAY FEVER BLISTER   No facility-administered medications prior to visit.

## 2024-02-28 NOTE — Assessment & Plan Note (Signed)
Chronic and well-controlled Followed by psychiatry

## 2024-02-28 NOTE — Assessment & Plan Note (Signed)
 Well controlled Continue current meds Also f/b Cardiology

## 2024-02-28 NOTE — Patient Instructions (Signed)

## 2024-03-01 LAB — COMPREHENSIVE METABOLIC PANEL WITH GFR
ALT: 7 IU/L (ref 0–32)
AST: 16 IU/L (ref 0–40)
Albumin: 4.3 g/dL (ref 3.9–4.9)
Alkaline Phosphatase: 132 IU/L (ref 49–135)
BUN/Creatinine Ratio: 14 (ref 12–28)
BUN: 9 mg/dL (ref 8–27)
Bilirubin Total: 0.6 mg/dL (ref 0.0–1.2)
CO2: 20 mmol/L (ref 20–29)
Calcium: 9.7 mg/dL (ref 8.7–10.3)
Chloride: 104 mmol/L (ref 96–106)
Creatinine, Ser: 0.64 mg/dL (ref 0.57–1.00)
Globulin, Total: 2.3 g/dL (ref 1.5–4.5)
Glucose: 99 mg/dL (ref 70–99)
Potassium: 4.2 mmol/L (ref 3.5–5.2)
Sodium: 141 mmol/L (ref 134–144)
Total Protein: 6.6 g/dL (ref 6.0–8.5)
eGFR: 99 mL/min/1.73

## 2024-03-01 LAB — LIPID PANEL
Chol/HDL Ratio: 2.6 ratio (ref 0.0–4.4)
Cholesterol, Total: 179 mg/dL (ref 100–199)
HDL: 69 mg/dL
LDL Chol Calc (NIH): 93 mg/dL (ref 0–99)
Triglycerides: 93 mg/dL (ref 0–149)
VLDL Cholesterol Cal: 17 mg/dL (ref 5–40)

## 2024-03-01 LAB — HEMOGLOBIN A1C
Est. average glucose Bld gHb Est-mCnc: 105 mg/dL
Hgb A1c MFr Bld: 5.3 % (ref 4.8–5.6)

## 2024-03-18 ENCOUNTER — Ambulatory Visit: Payer: Self-pay | Admitting: Family Medicine

## 2024-03-24 DIAGNOSIS — K219 Gastro-esophageal reflux disease without esophagitis: Secondary | ICD-10-CM

## 2024-04-16 NOTE — Telephone Encounter (Unsigned)
 Copied from CRM #[id number]. Topic: Referral - Question >> Apr 16, 2024  2:37 PM Delon DASEN wrote: Reason for CRM: Would like to see Dr Melany- gastro- in [place] [phone]

## 2024-04-17 NOTE — Telephone Encounter (Signed)
 Ok to resend referral as requested

## 2024-04-30 ENCOUNTER — Ambulatory Visit: Admitting: Cardiology

## 2024-05-12 ENCOUNTER — Ambulatory Visit

## 2024-05-19 ENCOUNTER — Encounter: Admitting: Nurse Practitioner

## 2024-05-26 ENCOUNTER — Ambulatory Visit: Admitting: Internal Medicine

## 2024-05-26 ENCOUNTER — Other Ambulatory Visit

## 2024-05-26 ENCOUNTER — Ambulatory Visit

## 2024-09-24 ENCOUNTER — Ambulatory Visit

## 2025-03-02 ENCOUNTER — Encounter: Admitting: Family Medicine
